# Patient Record
Sex: Female | Born: 1945 | Race: Black or African American | Hispanic: No | Marital: Married | Smoking: Former smoker
Health system: Southern US, Community
[De-identification: ages and names within clinical notes are randomized; demographics above are authoritative.]

## PROBLEM LIST (undated history)

## (undated) DIAGNOSIS — R531 Weakness: Secondary | ICD-10-CM

## (undated) DIAGNOSIS — F419 Anxiety disorder, unspecified: Secondary | ICD-10-CM

## (undated) DIAGNOSIS — E7800 Pure hypercholesterolemia, unspecified: Secondary | ICD-10-CM

## (undated) DIAGNOSIS — T8859XA Other complications of anesthesia, initial encounter: Secondary | ICD-10-CM

## (undated) DIAGNOSIS — R51 Headache: Secondary | ICD-10-CM

## (undated) DIAGNOSIS — Z8719 Personal history of other diseases of the digestive system: Secondary | ICD-10-CM

## (undated) DIAGNOSIS — K224 Dyskinesia of esophagus: Secondary | ICD-10-CM

## (undated) DIAGNOSIS — I499 Cardiac arrhythmia, unspecified: Secondary | ICD-10-CM

## (undated) DIAGNOSIS — I739 Peripheral vascular disease, unspecified: Secondary | ICD-10-CM

## (undated) DIAGNOSIS — R350 Frequency of micturition: Secondary | ICD-10-CM

## (undated) DIAGNOSIS — Z8614 Personal history of Methicillin resistant Staphylococcus aureus infection: Secondary | ICD-10-CM

## (undated) DIAGNOSIS — F32A Depression, unspecified: Secondary | ICD-10-CM

## (undated) DIAGNOSIS — R109 Unspecified abdominal pain: Secondary | ICD-10-CM

## (undated) DIAGNOSIS — M545 Low back pain, unspecified: Secondary | ICD-10-CM

## (undated) DIAGNOSIS — E611 Iron deficiency: Secondary | ICD-10-CM

## (undated) DIAGNOSIS — R490 Dysphonia: Secondary | ICD-10-CM

## (undated) DIAGNOSIS — I10 Essential (primary) hypertension: Secondary | ICD-10-CM

## (undated) DIAGNOSIS — K219 Gastro-esophageal reflux disease without esophagitis: Secondary | ICD-10-CM

## (undated) DIAGNOSIS — G4733 Obstructive sleep apnea (adult) (pediatric): Secondary | ICD-10-CM

## (undated) DIAGNOSIS — J45909 Unspecified asthma, uncomplicated: Secondary | ICD-10-CM

## (undated) DIAGNOSIS — M797 Fibromyalgia: Secondary | ICD-10-CM

## (undated) DIAGNOSIS — R102 Pelvic and perineal pain: Secondary | ICD-10-CM

## (undated) DIAGNOSIS — K56609 Unspecified intestinal obstruction, unspecified as to partial versus complete obstruction: Secondary | ICD-10-CM

## (undated) DIAGNOSIS — E039 Hypothyroidism, unspecified: Secondary | ICD-10-CM

## (undated) DIAGNOSIS — G629 Polyneuropathy, unspecified: Secondary | ICD-10-CM

## (undated) DIAGNOSIS — H409 Unspecified glaucoma: Secondary | ICD-10-CM

## (undated) DIAGNOSIS — T4145XA Adverse effect of unspecified anesthetic, initial encounter: Secondary | ICD-10-CM

## (undated) DIAGNOSIS — R0602 Shortness of breath: Secondary | ICD-10-CM

## (undated) DIAGNOSIS — M1990 Unspecified osteoarthritis, unspecified site: Secondary | ICD-10-CM

## (undated) DIAGNOSIS — M549 Dorsalgia, unspecified: Secondary | ICD-10-CM

## (undated) DIAGNOSIS — J302 Other seasonal allergic rhinitis: Secondary | ICD-10-CM

## (undated) DIAGNOSIS — F329 Major depressive disorder, single episode, unspecified: Secondary | ICD-10-CM

## (undated) DIAGNOSIS — N952 Postmenopausal atrophic vaginitis: Secondary | ICD-10-CM

## (undated) DIAGNOSIS — R519 Headache, unspecified: Secondary | ICD-10-CM

## (undated) DIAGNOSIS — G8929 Other chronic pain: Secondary | ICD-10-CM

## (undated) DIAGNOSIS — Z86718 Personal history of other venous thrombosis and embolism: Secondary | ICD-10-CM

## (undated) DIAGNOSIS — K5900 Constipation, unspecified: Secondary | ICD-10-CM

## (undated) HISTORY — PX: SPINE SURGERY: SHX786

## (undated) HISTORY — PX: COLONOSCOPY: SHX174

## (undated) HISTORY — PX: COLON SURGERY: SHX602

## (undated) HISTORY — PX: BACK SURGERY: SHX140

## (undated) HISTORY — PX: EYE SURGERY: SHX253

## (undated) HISTORY — PX: TONSILLECTOMY: SUR1361

## (undated) HISTORY — DX: Anxiety disorder, unspecified: F41.9

## (undated) HISTORY — PX: OTHER SURGICAL HISTORY: SHX169

## (undated) HISTORY — PX: DILATION AND CURETTAGE OF UTERUS: SHX78

## (undated) HISTORY — DX: Pure hypercholesterolemia, unspecified: E78.00

## (undated) HISTORY — DX: Dyskinesia of esophagus: K22.4

## (undated) HISTORY — PX: EXPLORATORY LAPAROTOMY: SUR591

## (undated) HISTORY — DX: Hypothyroidism, unspecified: E03.9

## (undated) HISTORY — DX: Unspecified abdominal pain: R10.9

## (undated) HISTORY — DX: Postmenopausal atrophic vaginitis: N95.2

---

## 1976-09-24 HISTORY — PX: ABDOMINAL HYSTERECTOMY: SHX81

## 1987-09-25 DIAGNOSIS — Z86718 Personal history of other venous thrombosis and embolism: Secondary | ICD-10-CM

## 1987-09-25 HISTORY — DX: Personal history of other venous thrombosis and embolism: Z86.718

## 2003-11-10 ENCOUNTER — Encounter: Admission: RE | Admit: 2003-11-10 | Discharge: 2003-11-10 | Payer: Self-pay | Admitting: Family Medicine

## 2003-11-24 ENCOUNTER — Encounter: Admission: RE | Admit: 2003-11-24 | Discharge: 2003-11-24 | Payer: Self-pay | Admitting: Family Medicine

## 2004-01-06 ENCOUNTER — Encounter: Admission: RE | Admit: 2004-01-06 | Discharge: 2004-01-06 | Payer: Self-pay | Admitting: Family Medicine

## 2004-02-10 ENCOUNTER — Encounter: Admission: RE | Admit: 2004-02-10 | Discharge: 2004-02-10 | Payer: Self-pay | Admitting: Sports Medicine

## 2004-03-16 ENCOUNTER — Encounter: Admission: RE | Admit: 2004-03-16 | Discharge: 2004-03-16 | Payer: Self-pay | Admitting: Sports Medicine

## 2004-04-18 ENCOUNTER — Encounter: Admission: RE | Admit: 2004-04-18 | Discharge: 2004-04-18 | Payer: Self-pay | Admitting: Family Medicine

## 2004-06-30 ENCOUNTER — Encounter: Admission: RE | Admit: 2004-06-30 | Discharge: 2004-06-30 | Payer: Self-pay | Admitting: Gastroenterology

## 2004-07-25 ENCOUNTER — Ambulatory Visit (HOSPITAL_COMMUNITY): Admission: RE | Admit: 2004-07-25 | Discharge: 2004-07-25 | Payer: Self-pay | Admitting: Gastroenterology

## 2004-12-01 IMAGING — RF DG ESOPHAGUS
10 of 15 series · 14 of 24 positions shown · non-contrast
Comparison: none

CLINICAL DATA: Dysphagia with slow progression of food.  History of esophageal dilatations and gastroesophageal reflux disease. 
 ESOPHAGUS ? BARIUM SWALLOW

[Series 1: run · 2 of 6 slices shown (1 of 10)]
[im 1/6]
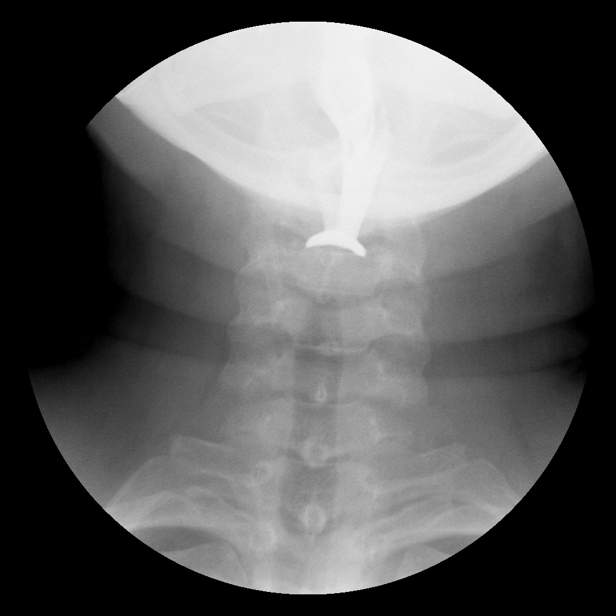
[im 4/6]
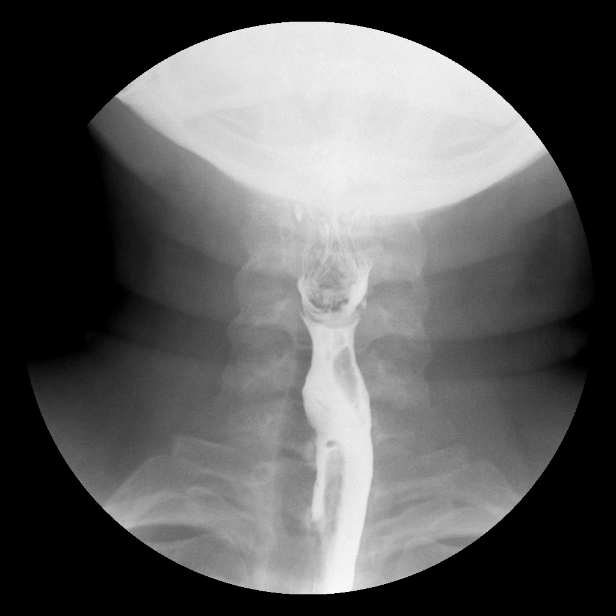

[Series 2: run · 4 of 9 slices shown (2 of 10)]
[im 1/9]
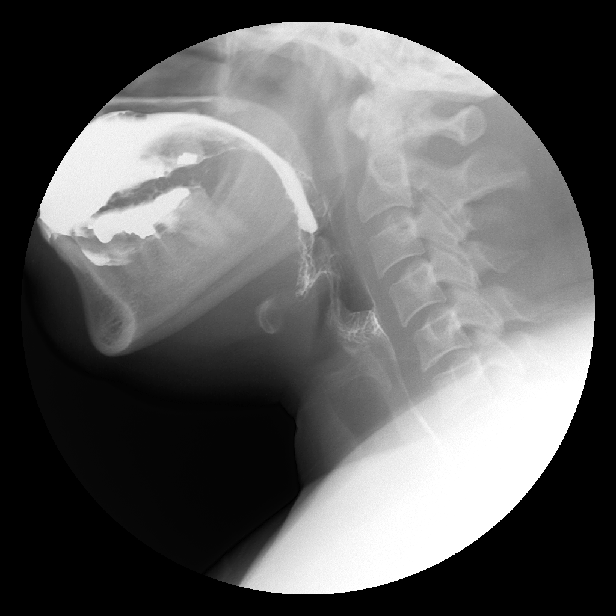
[im 3/9]
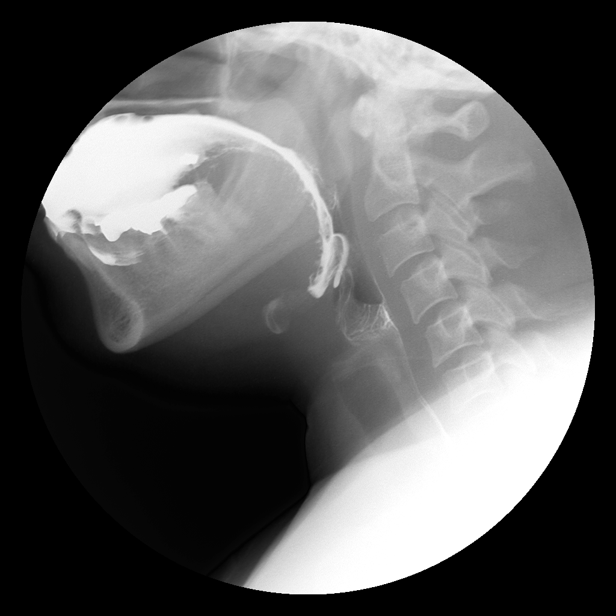
[im 5/9]
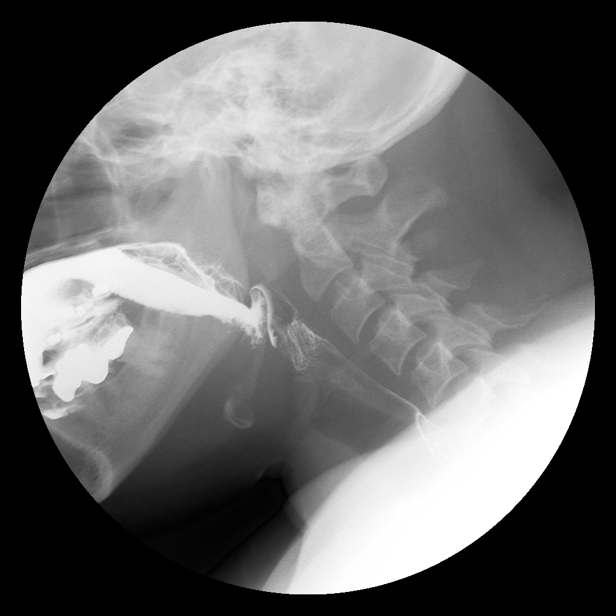
[im 7/9]
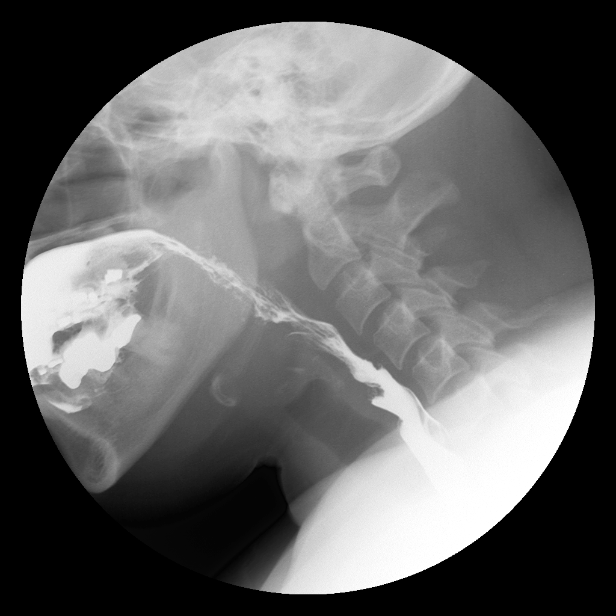

[Series 3: run · 1 of 1 slices shown (3 of 10)]
[im 1/1]
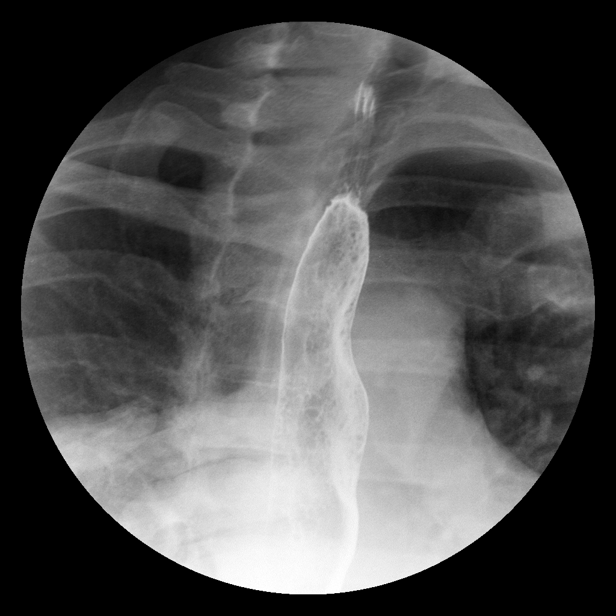

[Series 4: run · 1 of 1 slices shown (4 of 10)]
[im 1/1]
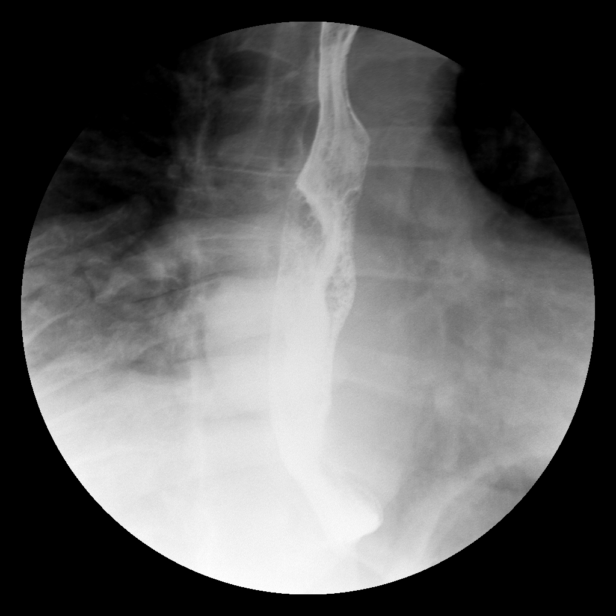

[Series 6: run · 1 of 1 slices shown (5 of 10)]
[im 1/1]
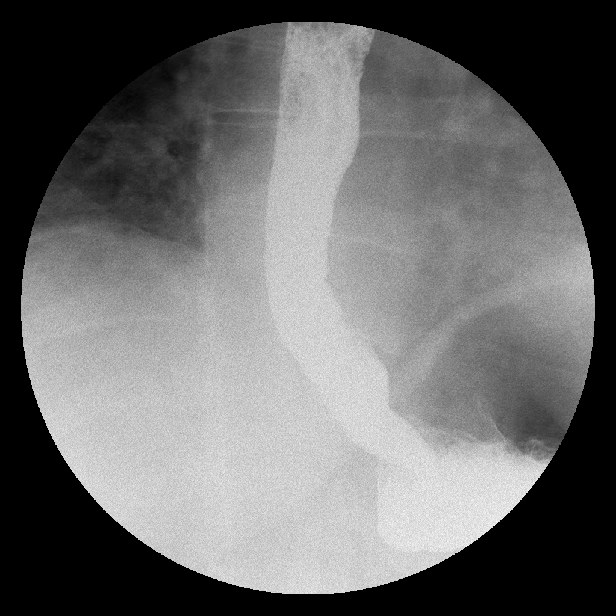

[Series 8: run · 1 of 1 slices shown (6 of 10)]
[im 1/1]
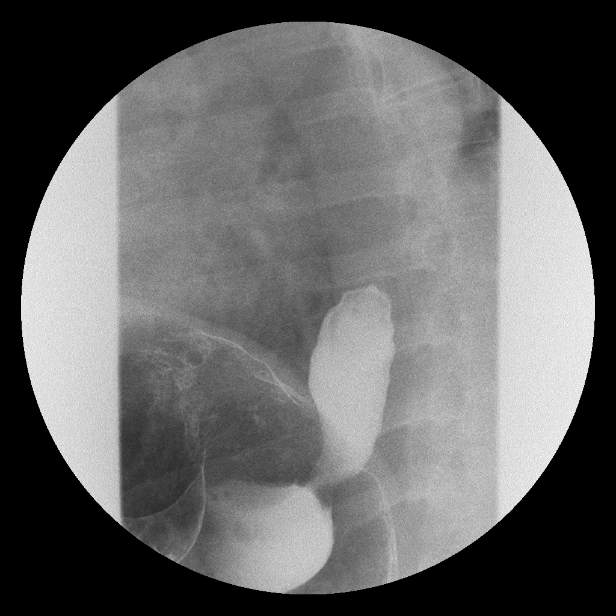

[Series 10: run · 1 of 1 slices shown (7 of 10)]
[im 1/1]
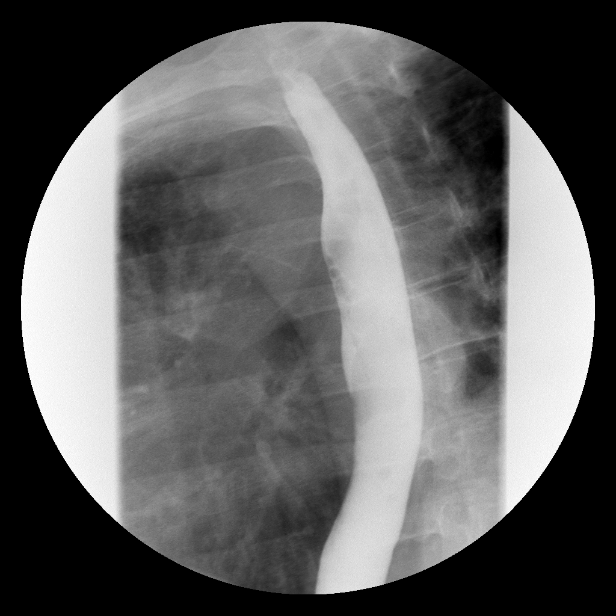

[Series 11: run · 1 of 1 slices shown (8 of 10)]
[im 1/1]
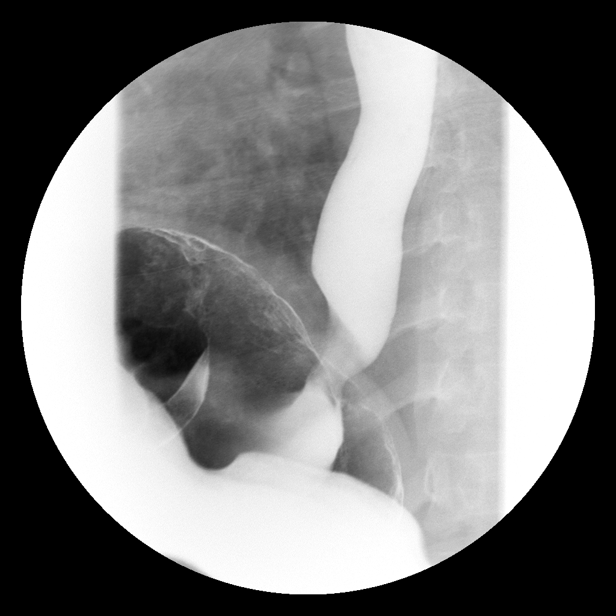

[Series 13: run · 1 of 1 slices shown (9 of 10)]
[im 1/1]
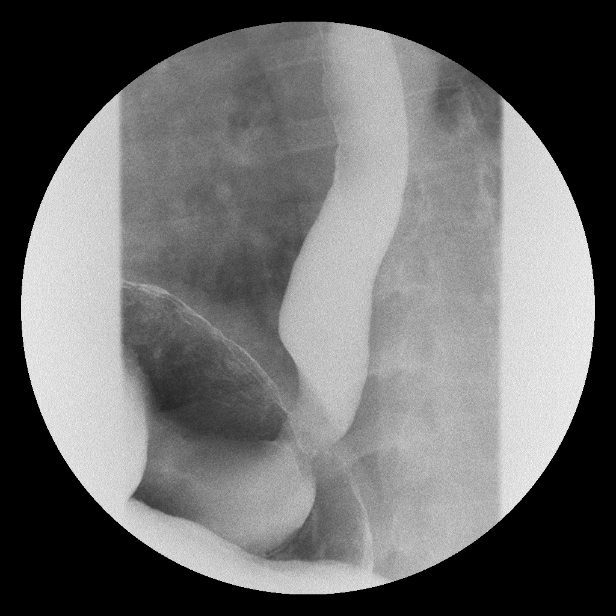

[Series 15: run · 1 of 1 slices shown (10 of 10)]
[im 1/1]
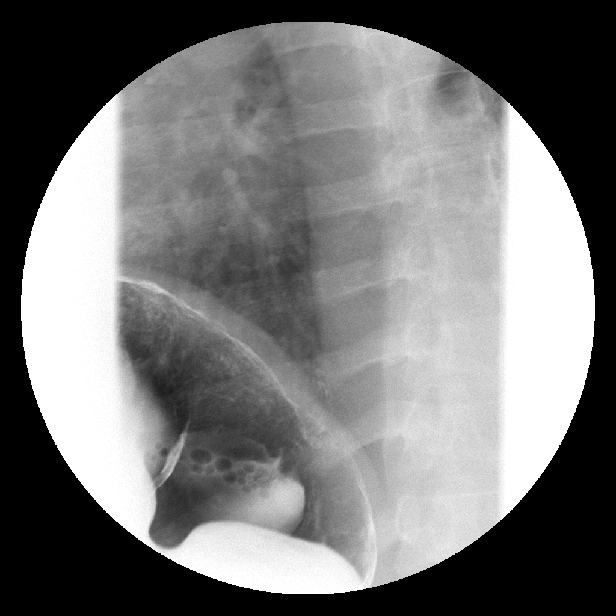

[14 of 24 positions shown; findings below may reference images not displayed]

FINDINGS: The pharyngeal phase of swallowing demonstrates early spillover of contrast with vallecular and piriform pooling.  With subsequent swallows there was some failure of clearing of the vallecula.  There was also flash penetration of the larynx.  Overall, the patient?s swallowing is felt to be dyscoordinated and spleen pathology evaluation is recommended.  However, we did not demonstrate frank tracheal aspiration. 
 Primary peristaltic waves of the esophagus were normal on [DATE] swallows, with disruption on [DATE] swallows.  This is borderline for esophageal dysmotility.  
 A 13 mm barium tablet passed without difficulty into the stomach. 
 Distally, there is some mild narrowing of the esophagus just proximal to the GE junction.  This is a smooth narrowing and I do not see a definite mass to suggest malignancy.  Overall, I estimate the caliber of the distal esophagus at somewhere between 15 and 20 mm.
 IMPRESSION
 1. Dyscoordinated swallowing with flash laryngeal penetration, early spillover, and vallecular and piriform residue.  Recommend dedicated speech pathology evaluation. 
 2.  Borderline appearance for esophageal dysmotility.
 3.  Mild smooth narrowing of the distal esophagus just above the GE junction, with a maximum diameter of 15 to 20 mm.  A 13 mm barium tablet passed without difficulty into the stomach.

## 2005-02-27 ENCOUNTER — Other Ambulatory Visit: Admission: RE | Admit: 2005-02-27 | Discharge: 2005-02-27 | Payer: Self-pay | Admitting: Family Medicine

## 2005-03-01 ENCOUNTER — Encounter: Admission: RE | Admit: 2005-03-01 | Discharge: 2005-03-01 | Payer: Self-pay | Admitting: Family Medicine

## 2005-03-14 ENCOUNTER — Encounter: Admission: RE | Admit: 2005-03-14 | Discharge: 2005-03-14 | Payer: Self-pay | Admitting: Family Medicine

## 2005-06-07 ENCOUNTER — Encounter: Admission: RE | Admit: 2005-06-07 | Discharge: 2005-06-07 | Payer: Self-pay | Admitting: Family Medicine

## 2005-08-02 IMAGING — CT CT PELVIS W/ CM
1 of 2 series · 14 of 32 positions shown, 18 images · IV contrast (gastro & omnipaque [ID])
Comparison: None.

CLINICAL DATA: Abdominal and right lower quadrant pain. 
CT ABDOMEN AND PELVIS W/CONTRAST:
TECHNIQUE: Contiguous 5mm axial images of the abdomen and pelvis were obtained after oral and 985cc of non ionic intravenous contrast.  The patient became quite nauseated and vomited after the IV contrast administration.  I personally examined the patient after this and she had no respiratory wheezing nor any signs/symptoms of oropharyngeal swelling although she did complain of some nasal (stuffiness).  This seemed to be mostly related to the episode of vomiting rather than any substantial nasopharyngeal edema.  She also had no hives or other ill affects.  This is most likely related to a idiosyncratic reaction to the contrast rather than a true contrast allergy.  She was monitored in the [HOSPITAL] for approximately 15 minutes after the exam and was in stable condition at that time with no complaints.  She was discharged in good condition. 
CT ABDOMEN W/CONTRAST: 
No focal abnormalities seen in the liver or spleen.  There is mild wall thickening in the gastric antrum which may be related to peristalsis, but an area of gastric inflammatory wall thickening could also have this appearance.  The duodenum, pancreas, and right adrenal gland have normal features.  A 13mm left adrenal nodule averages 60 hounsfield units which is too high to allow it?s characterization as an adenoma.  Subtle high attenuation in the gallbladder fundus suggests the presence of gallstones.  No focal abnormality is seen in either kidney on the cortical medullary phase of this study.  Renal delay images were not obtained secondary to the patient?s illness.  
No free fluid, lymphadenopathy, or abdominal aortic aneurysm.

[Series 2: — · axial · 0.89mm/px · z∈[-344,+36]mm · 14 of 84 slices shown, 18 images]
[im 4/84  soft-tissue]
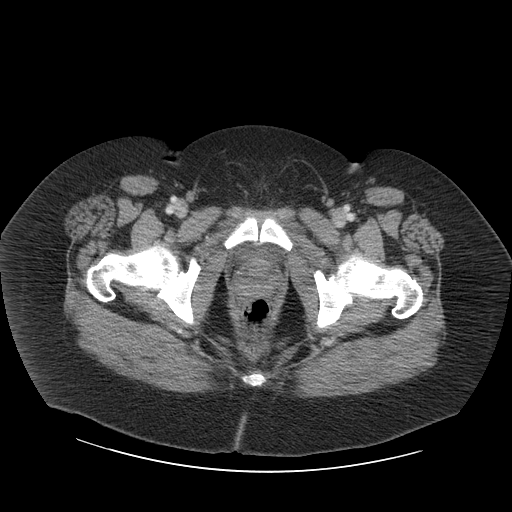
[im 4/84  bone]
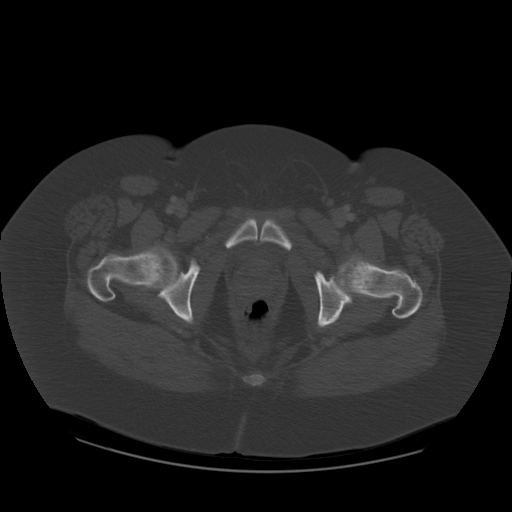
[im 11/84  soft-tissue]
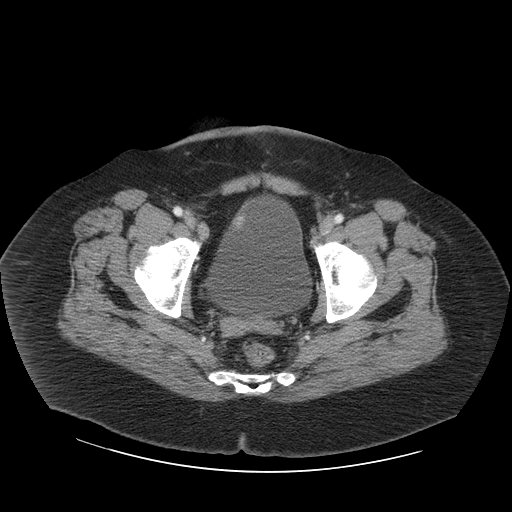
[im 18/84  soft-tissue]
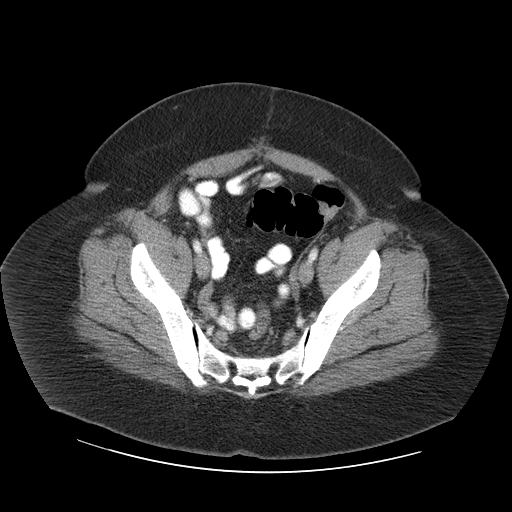
[im 25/84  soft-tissue]
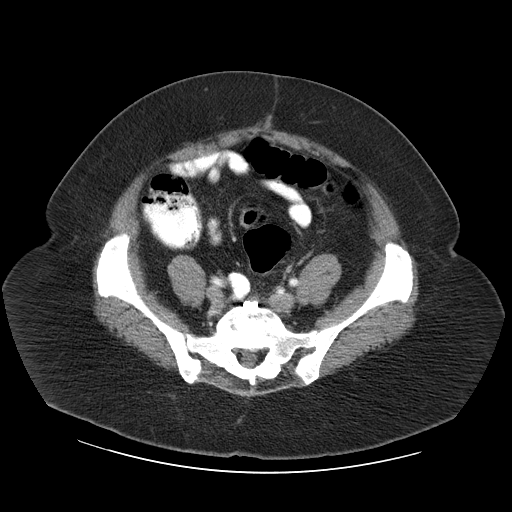
[im 32/84  soft-tissue]
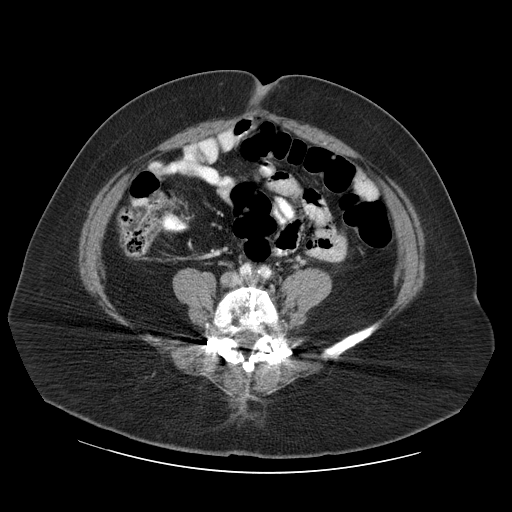
[im 39/84  soft-tissue]
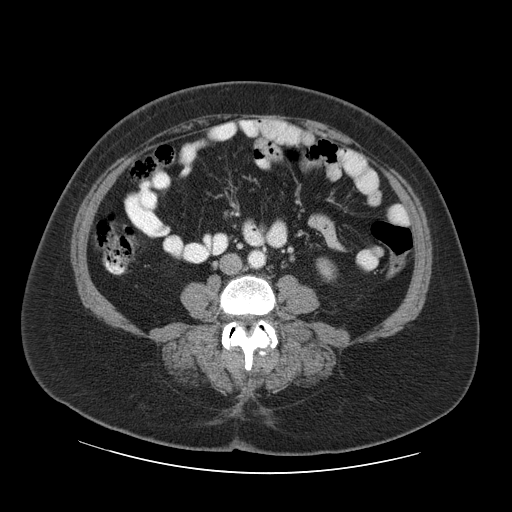
[im 45/84  soft-tissue]
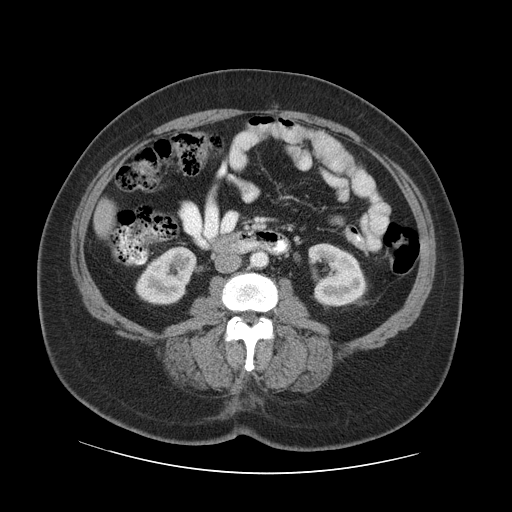
[im 52/84  soft-tissue]
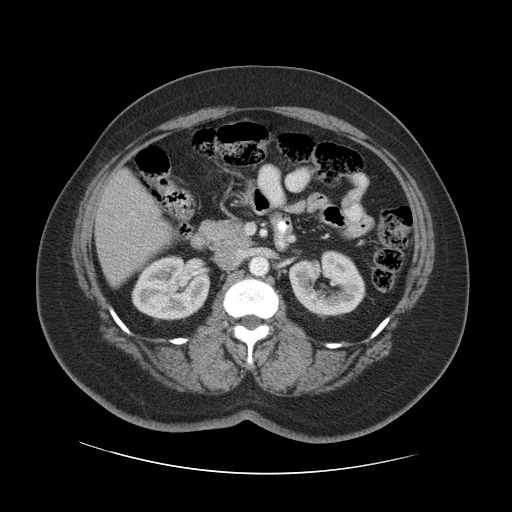
[im 59/84  soft-tissue]
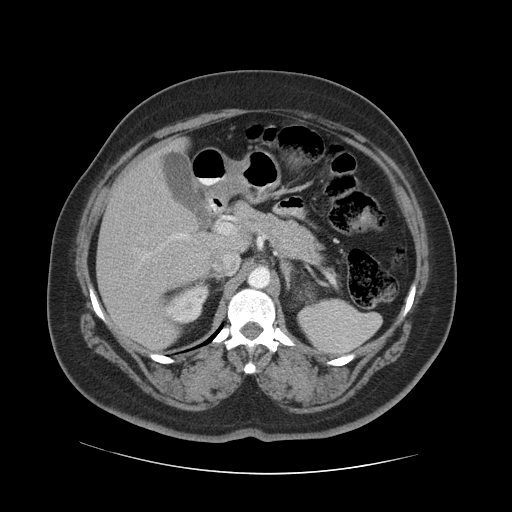
[im 59/84  bone]
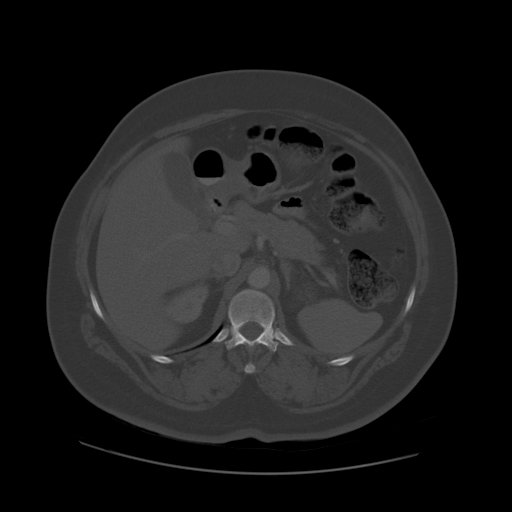
[im 66/84  soft-tissue]
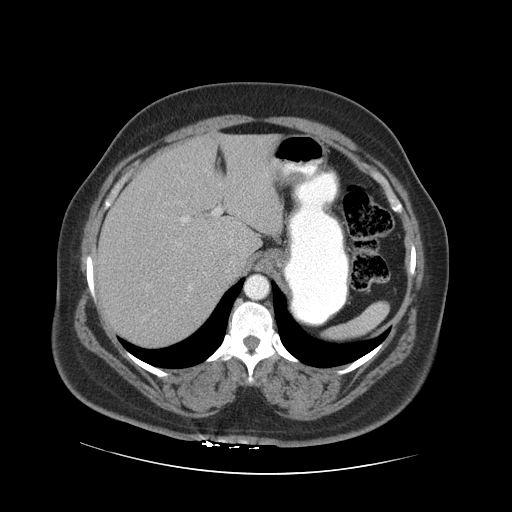
[im 70/84  lung]
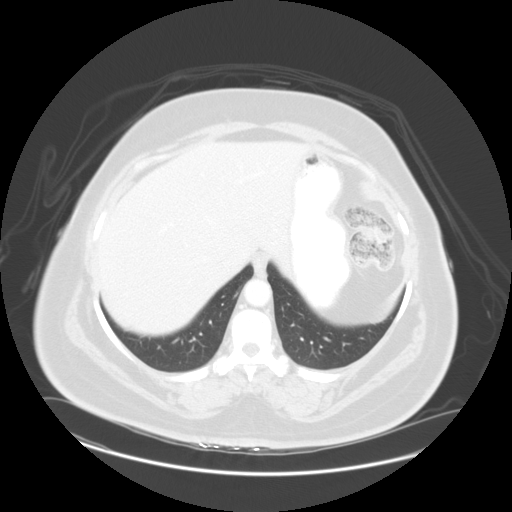
[im 73/84  soft-tissue]
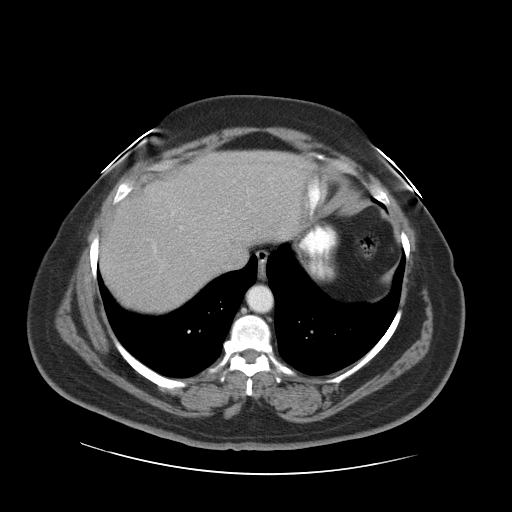
[im 73/84  lung]
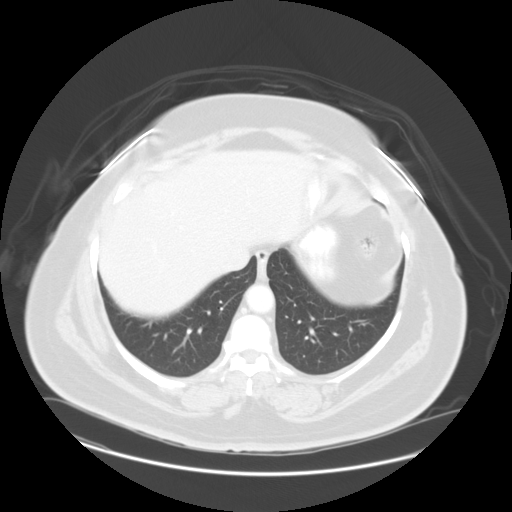
[im 77/84  lung]
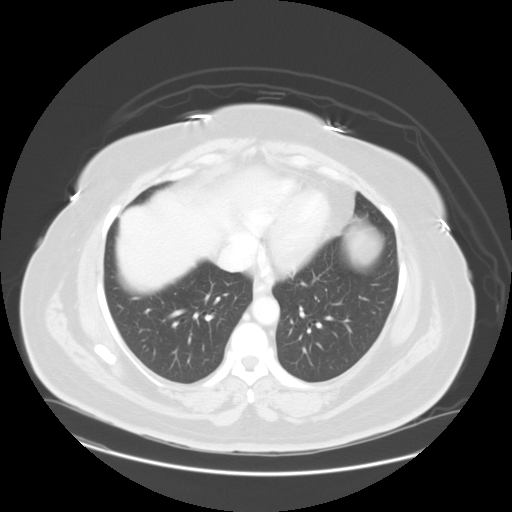
[im 80/84  soft-tissue]
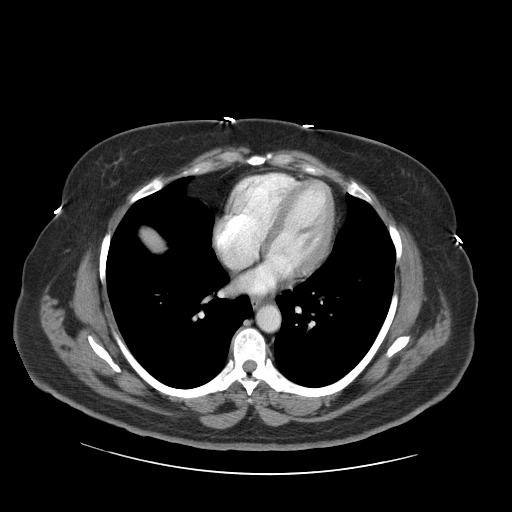
[im 80/84  lung]
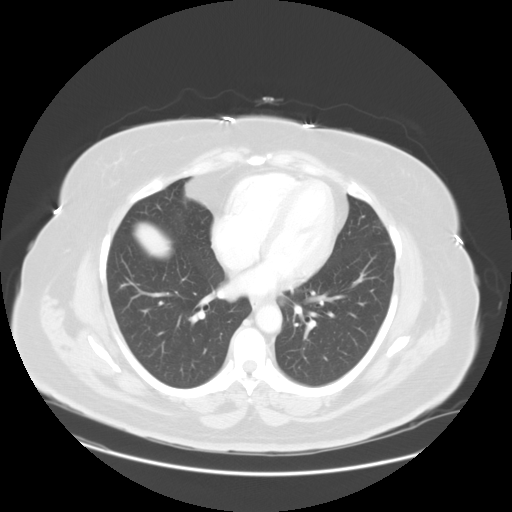

[14 of 32 positions shown; findings below may reference images not displayed]

IMPRESSION: 1.  Question wall thickening in the distal stomach.  Upper GI series or endoscopy would prove helpful to further characterize. 
2.  Probable cholelithiasis.  Ultrasound would be helpful to further investigate if clinically indicated.  
3.  13mm left adrenal nodule is statistically most likely a benign adrenal adenoma but cannot be characterized as such on this post contrast study.  Dedicated adrenal MRI could likely definitively characterize this lesion.  
CT PELVIS W/CONTRAST: 
No free fluid or lymphadenopathy.  Patient is status post hysterectomy.  The ovaries are normal bilaterally.  Terminal ileum is unremarkable.  The appendix is not visualized.  Post operative change is noted in the lower back.
IMPRESSION: No acute findings in the anatomic pelvis.

## 2005-08-15 IMAGING — MR MR ABDOMEN WO/W CM
10 of 12 series · 36 of 48 positions shown · IV contrast (omniscan)
Comparison: none

CLINICAL DATA: Adrenal lesion detected on CT scan, 03/01/05.
MRI ABDOMEN WITHOUT AND WITH CONTRAST:
TECHNIQUE: Multiplanar, multisequence MR imaging of the abdomen was performed both before and after administration of intravenous contrast.
Contrast:  20 cc Omniscan IV.

[Series 4: t2_haste_cor_bh · coronal · 5.0mm · 1.48mm/px · 2 of 13 slices shown (1 of 2)]
[im 1/13]
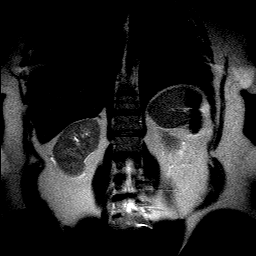
[im 13/13]
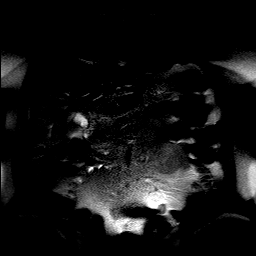

[Series 5: t2_haste_tra_bh · axial · 5.0mm · 1.48mm/px · z∈[-50,+44]mm · 3 of 18 slices shown (1 of 2)]
[im 1/18]
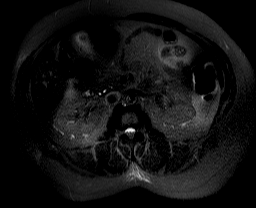
[im 9/18]
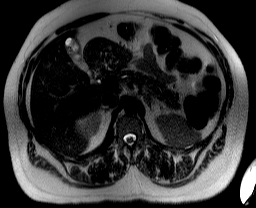
[im 18/18]
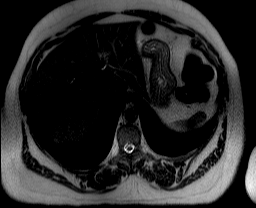

[Series 6: t2_haste_tra_bh · axial · 5.0mm · 1.48mm/px · z∈[-56,+37]mm · 3 of 18 slices shown (2 of 2)]
[im 1/18]
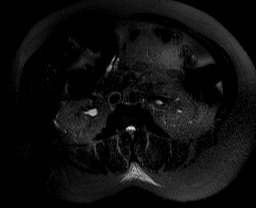
[im 9/18]
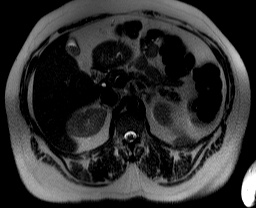
[im 18/18]
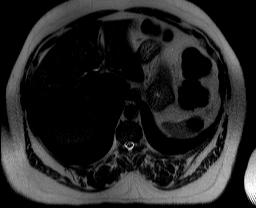

[Series 7: bSSFP · axial · 5.0mm · 1.48mm/px · z∈[-56,+37]mm · 3 of 18 slices shown (1 of 2)]
[im 1/18]
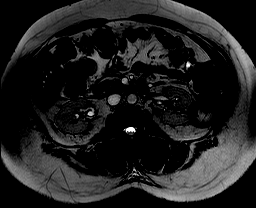
[im 9/18]
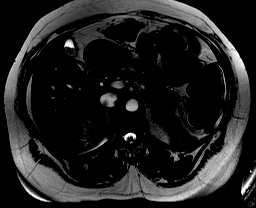
[im 18/18]
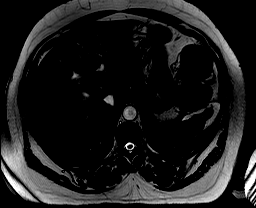

[Series 8: bSSFP · coronal · 5.0mm · 1.48mm/px · 2 of 13 slices shown (2 of 2)]
[im 1/13]
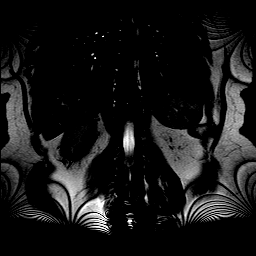
[im 13/13]
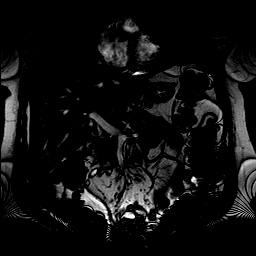

[Series 9: t2_haste_cor_bh · coronal · 5.0mm · 1.48mm/px · 2 of 13 slices shown (2 of 2)]
[im 1/13]
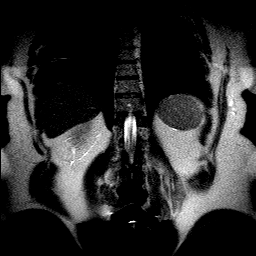
[im 13/13]
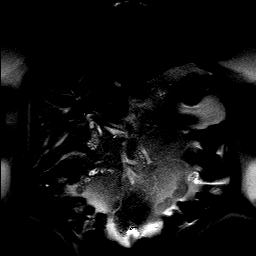

[Series 10: t1_fl2d_in_opp_ph_tra_mbh · axial · 4.0mm · 0.74mm/px · z∈[-75,+28]mm · 7 of 40 slices shown]
[im 1/40]
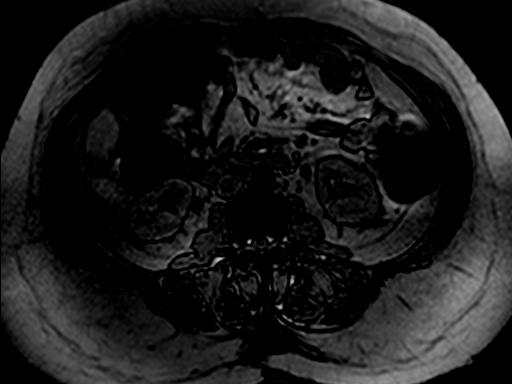
[im 7/40]
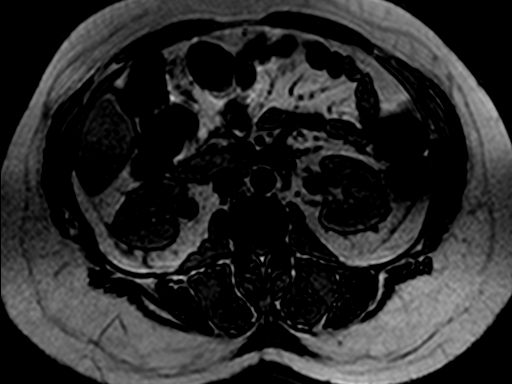
[im 14/40]
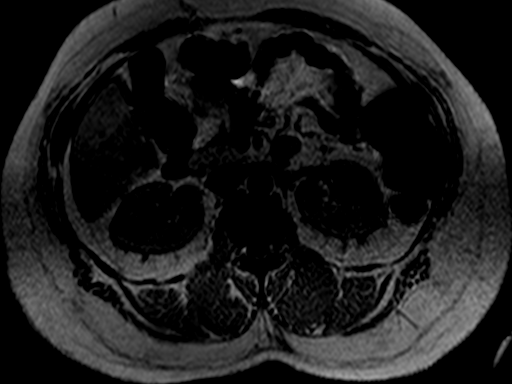
[im 20/40]
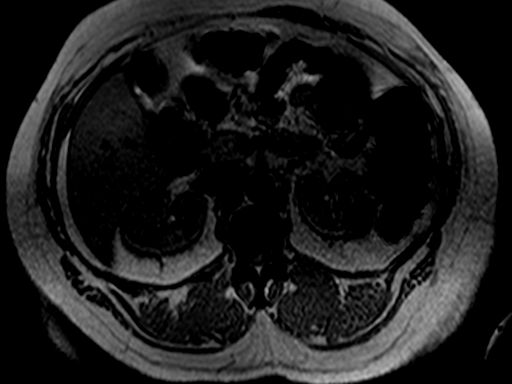
[im 27/40]
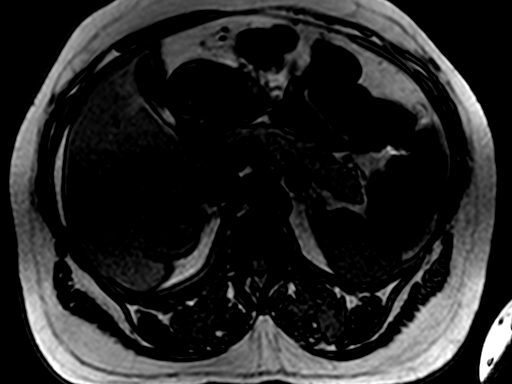
[im 33/40]
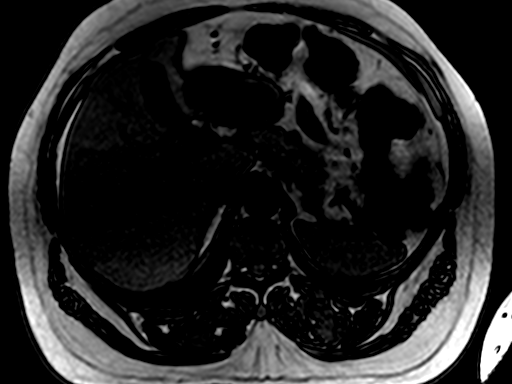
[im 40/40]
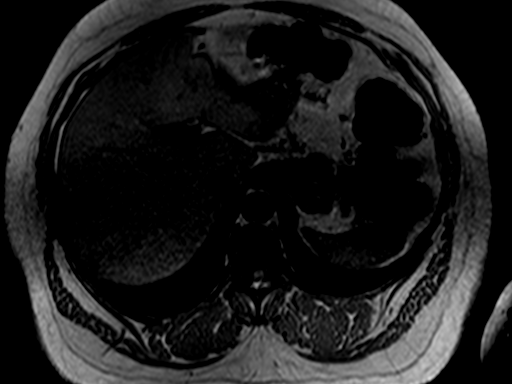

[Series 11: T1 dynamic fat-sat · axial · 2.5mm · 0.74mm/px · z∈[-59,+29]mm · 6 of 36 slices shown]
[im 1/36]
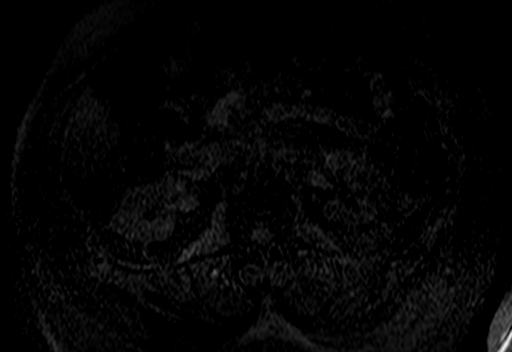
[im 8/36]
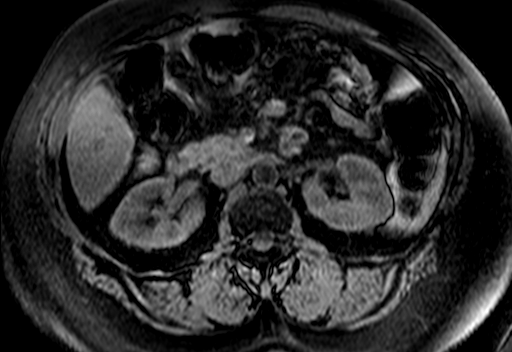
[im 15/36]
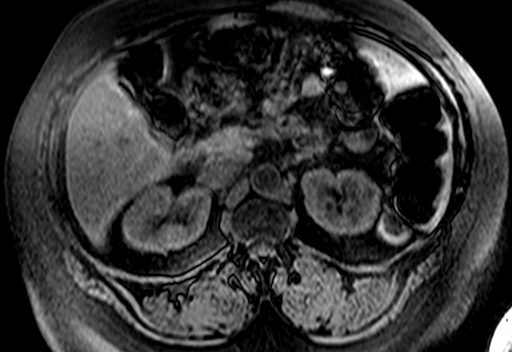
[im 22/36]
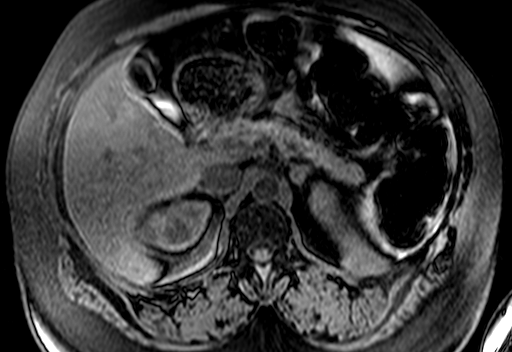
[im 29/36]
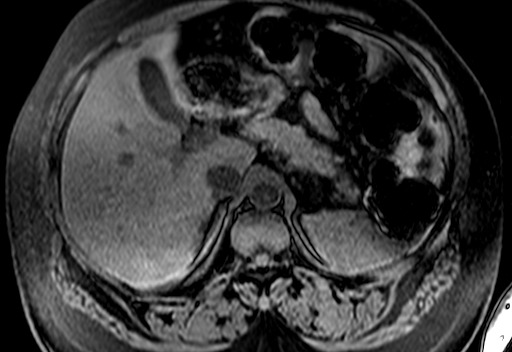
[im 36/36]
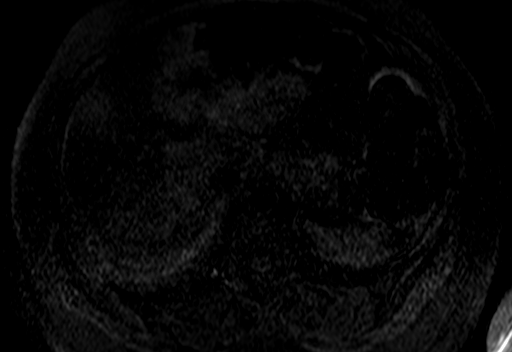

[Series 12: post fs axial-immediate · axial · 2.5mm · 0.74mm/px · z∈[-59,+29]mm · 6 of 36 slices shown]
[im 1/36]
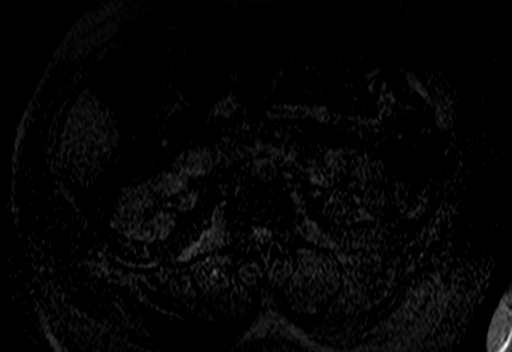
[im 8/36]
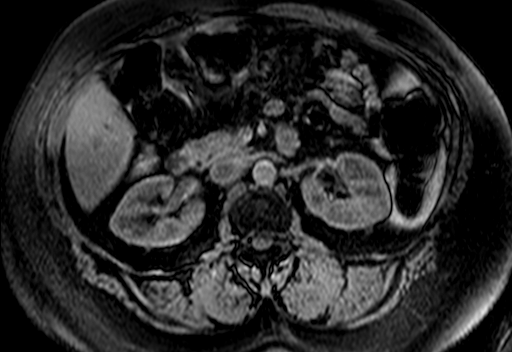
[im 15/36]
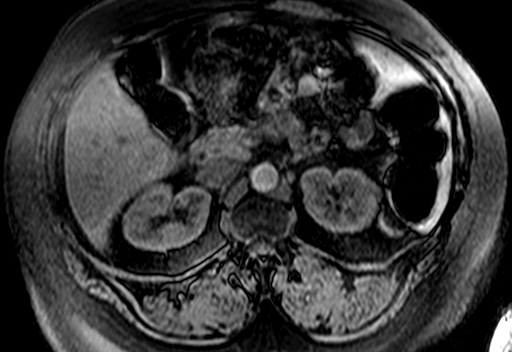
[im 22/36]
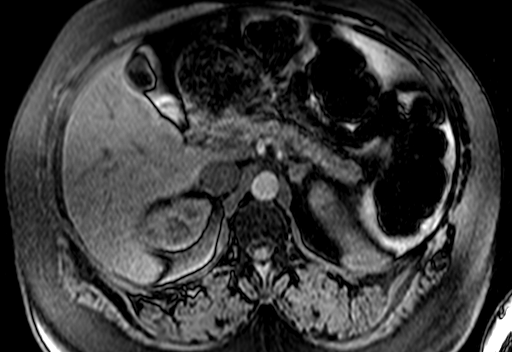
[im 29/36]
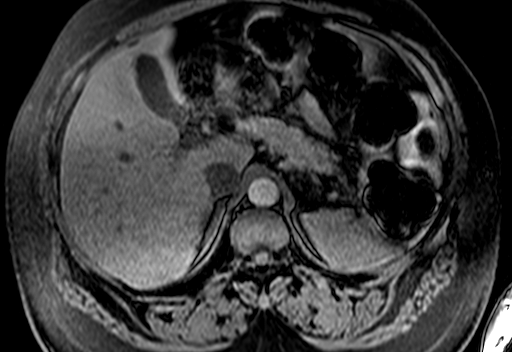
[im 36/36]
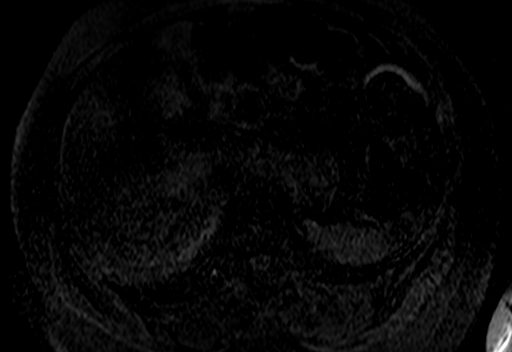

[Series 13: post fs axial · axial · 2.5mm · 0.74mm/px · z∈[-59,-41]mm · 2 of 36 slices shown]
[im 1/36]
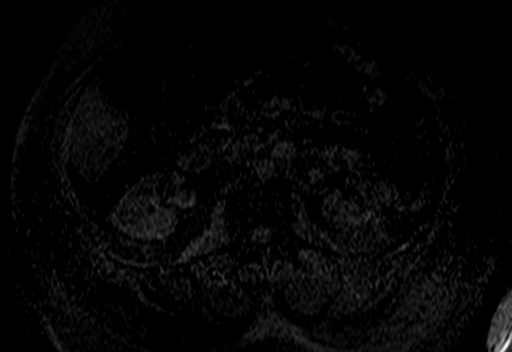
[im 8/36]
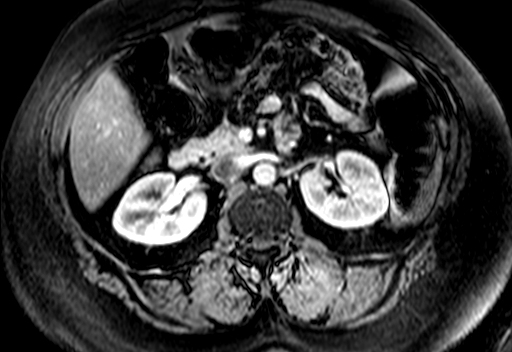

[36 of 48 positions shown; findings below may reference images not displayed]

FINDINGS: As identified on the patient?s CT scan, there is a left adrenal lesion measuring approximately 1.5 x 1.1 cm.  In and opposed phase imaging demonstrates a small focus of signal drop out in the anterolateral aspect of the lesion.  The bulk of the lesion does not demonstrate signal drop out.  This likely represents a lipid poor adenoma, but it cannot be definitively characterized.  Right adrenal gland is normal in appearance.  Kidneys, imaged liver, spleen and pancreas are normal.  Stones are identified within the gallbladder.  No adenopathy or fluid collections.
IMPRESSION: 1.  Left adrenal lesion which almost certainly represents a lipid poor adenoma.  Limited CT scan through the adrenal glands without contrast in three months could be used to ensure stability. 
2.  Gallstones.

## 2005-08-15 IMAGING — MR MR LUMBAR SPINE WO/W CM
4 of 6 series · 19 of 48 positions shown · IV contrast (omniscan)
Comparison: none

CLINICAL DATA: Back pain with right groin pain. 
MRI LUMBAR SPINE WITHOUT AND WITH CONTRAST:
TECHNIQUE: Multiplanar and multiecho pulse sequences of the lumbar spine, to include the lower thoracic region and upper sacral regions, were obtained according to standard protocol before and after administration of intravenous contrast.
Contrast:  20 cc IV Omniscan.
No comparison.

[Series 8: t1_tse_tra_msma · axial · 4.0mm · 0.43mm/px · z∈[-130,+4]mm · 9 of 21 slices shown]
[im 1/21]
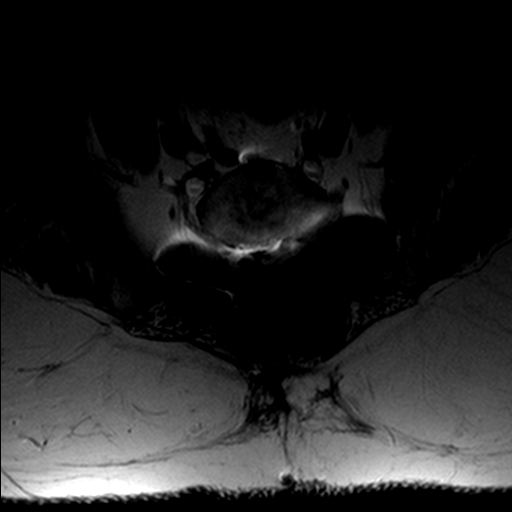
[im 4/21]
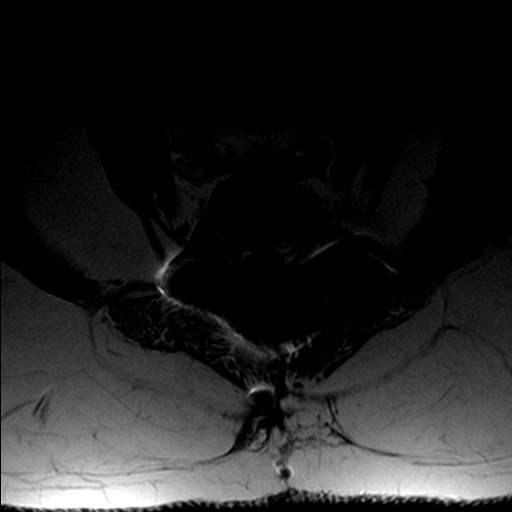
[im 6/21]
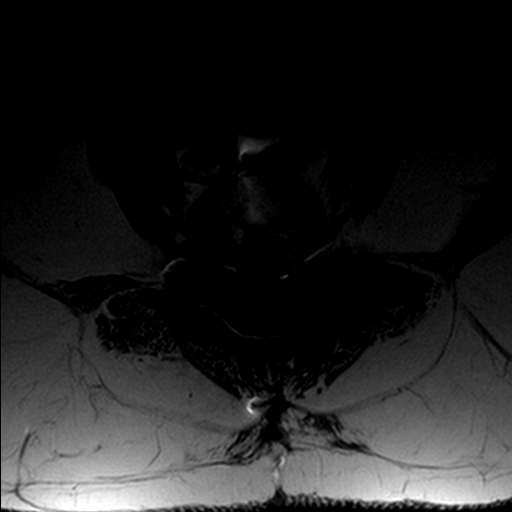
[im 10/21]
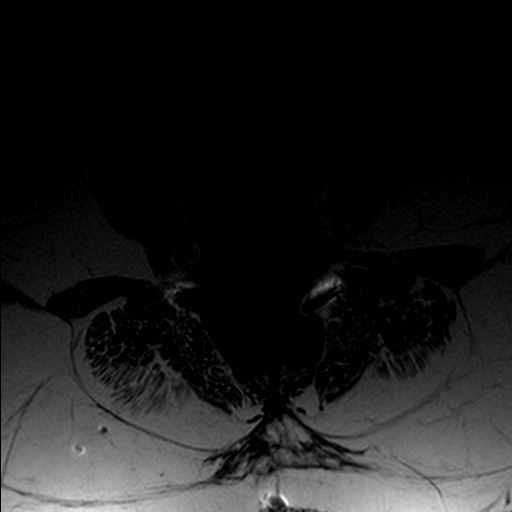
[im 11/21]
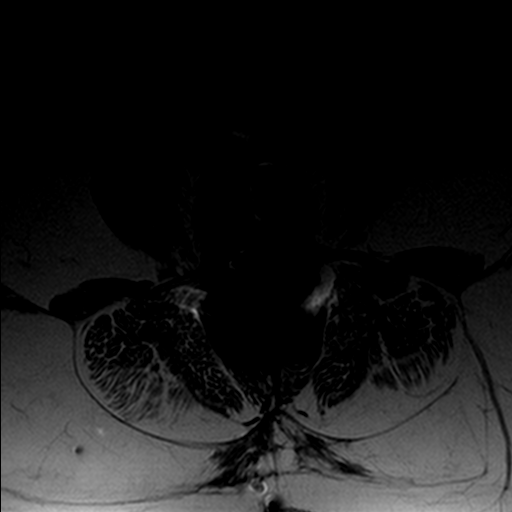
[im 15/21]
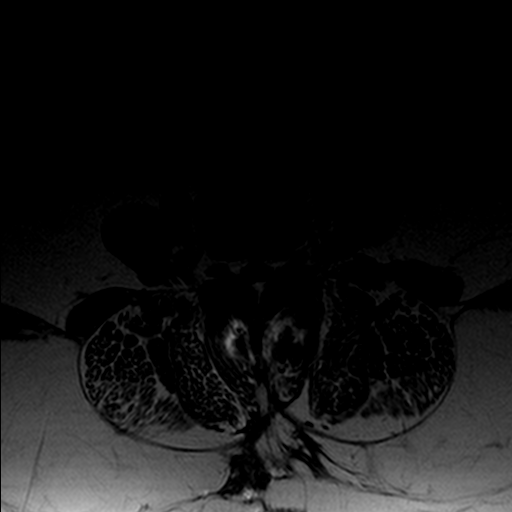
[im 17/21]
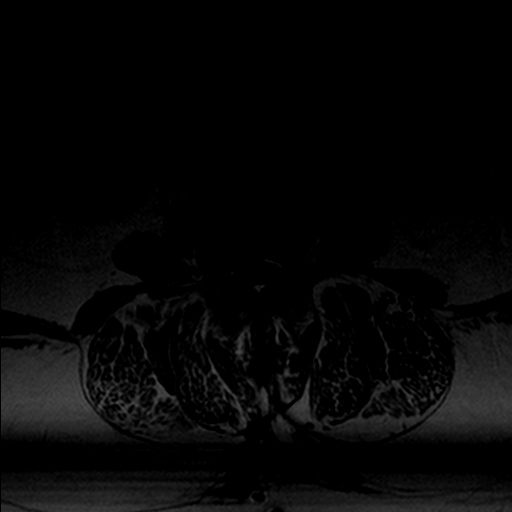
[im 19/21]
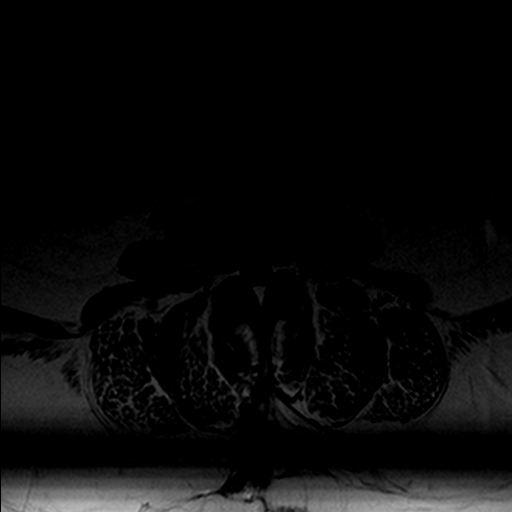
[im 21/21]
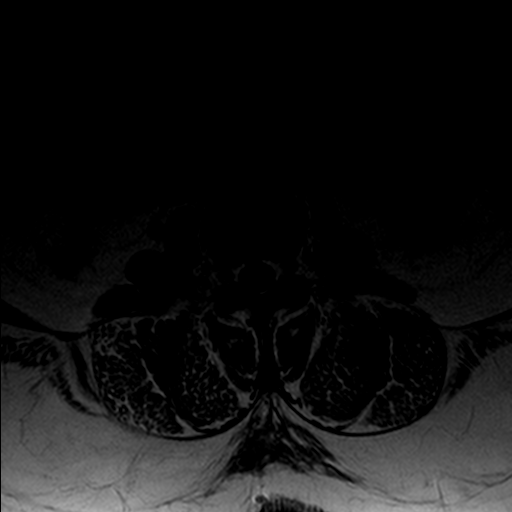

[Series 9: t2_tse_tra_msma · axial · 4.0mm · 0.43mm/px · z∈[-130,-5]mm · 4 of 21 slices shown]
[im 1/21]
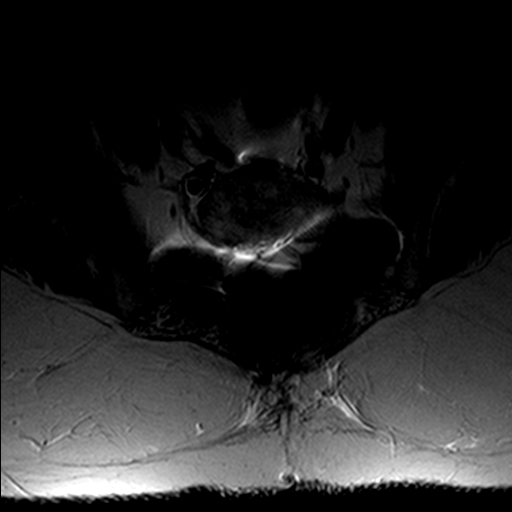
[im 4/21]
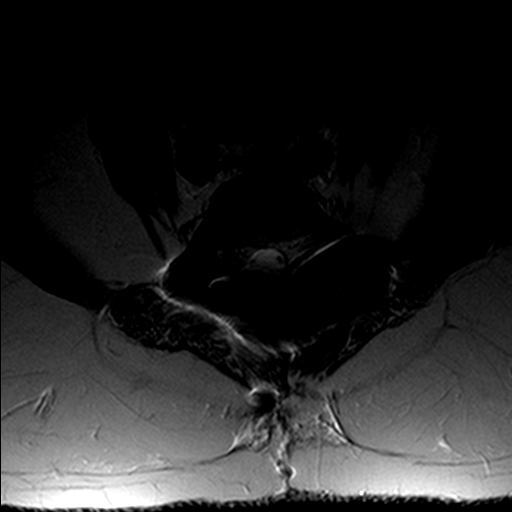
[im 11/21]
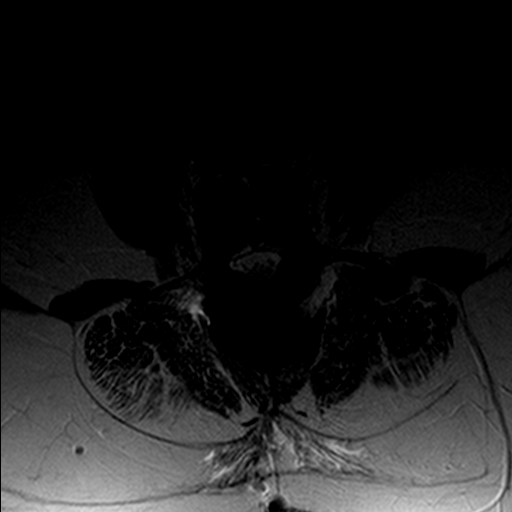
[im 19/21]
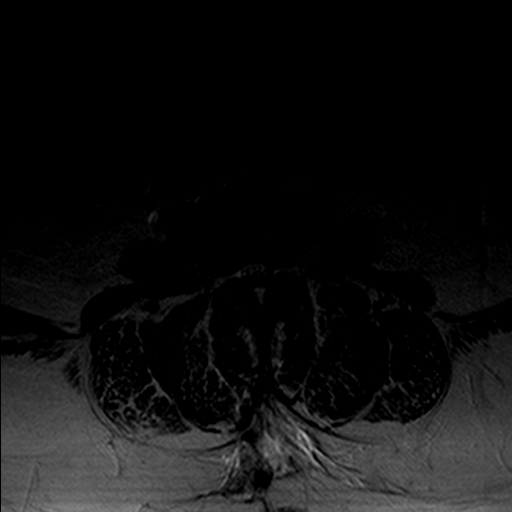

[Series 10: t2_tse_rst_sag_s4_(id) · sagittal · 4.0mm · 0.55mm/px · 3 of 11 slices shown]
[im 3/11]
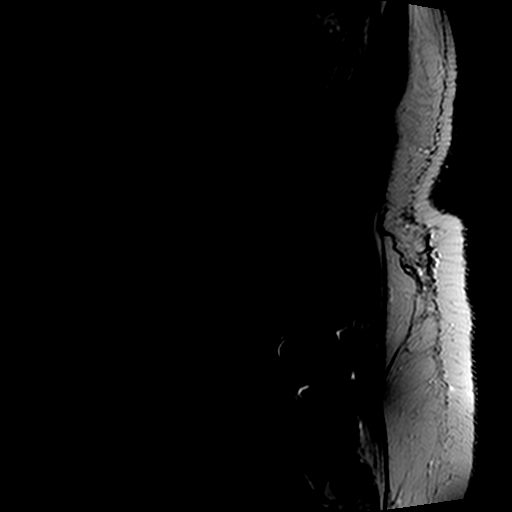
[im 7/11]
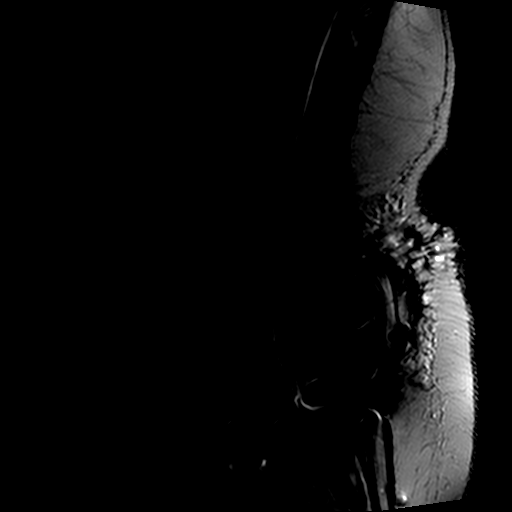
[im 11/11]
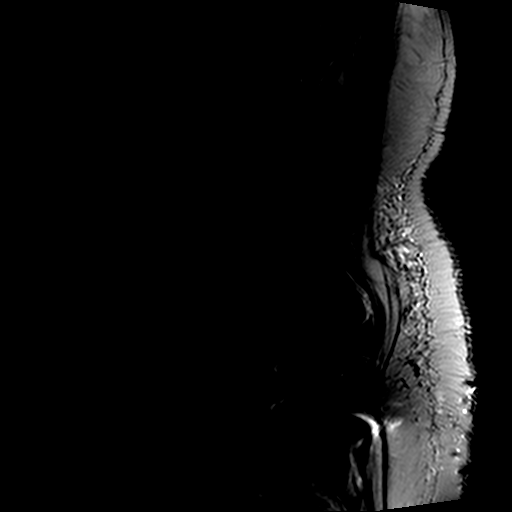

[Series 11: t1_tse_sag_s5_(id) · sagittal · 4.0mm · 0.55mm/px · 3 of 11 slices shown]
[im 3/11]
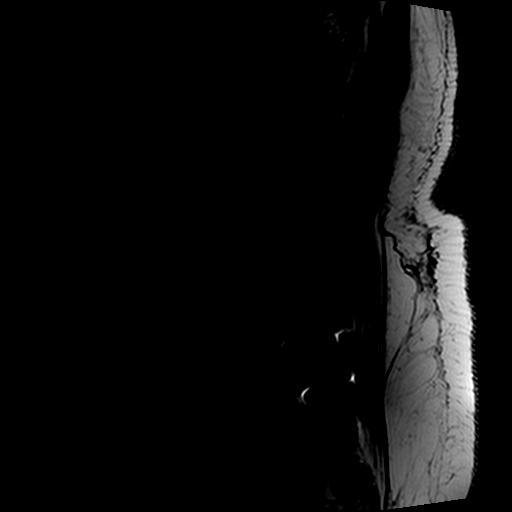
[im 7/11]
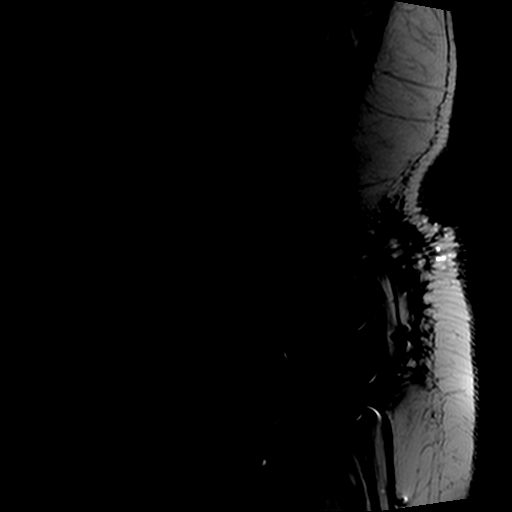
[im 11/11]
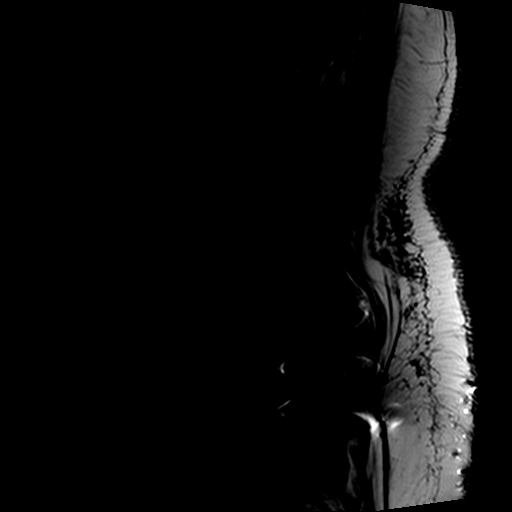

[19 of 48 positions shown; findings below may reference images not displayed]

FINDINGS: There has been prior fusion of L4-5 and L5-S1 with interbody bony fusion.  There also appear to be anterior fusion screws at L4 and L5.  There are posterior fusion screws which appear to be in the posterior elements at L4 and L5.  
I do not have x-rays to correlate the exact location of the fusion hardware.  The lumbar alignment is normal. There is no fracture.  The conus medullaris is normal. There is mild motion on the study.  The enhancement pattern is normal.  
L1-2:  Negative.
L2-3:  Mild disk bulging. 
L3-4:  Broad-based diffuse disk protrusion is present.  In addition, there is moderate facet arthropathy and there is moderate spinal stenosis.
L4-5:  There appears to be a solid interbody fusion.  There is no spinal stenosis although there is considerable artifact due to the hardware.
L5-S1:  This area is obscured by artifact from the hardware.  There is not felt to be spinal stenosis.
IMPRESSION: 1.  Interbody and posterior element fusion of L4, L5 and S1.
2.  Disk protrusion at L3-4. There is facet arthropathy and moderate spinal stenosis at L3-4.

## 2005-09-24 HISTORY — PX: NOSE SURGERY: SHX723

## 2005-10-17 ENCOUNTER — Encounter: Admission: RE | Admit: 2005-10-17 | Discharge: 2006-01-15 | Payer: Self-pay | Admitting: Family Medicine

## 2005-12-10 ENCOUNTER — Encounter: Admission: RE | Admit: 2005-12-10 | Discharge: 2005-12-10 | Payer: Self-pay | Admitting: Family Medicine

## 2005-12-31 ENCOUNTER — Ambulatory Visit: Payer: Self-pay | Admitting: Pulmonary Disease

## 2006-01-11 ENCOUNTER — Encounter (INDEPENDENT_AMBULATORY_CARE_PROVIDER_SITE_OTHER): Payer: Self-pay | Admitting: Specialist

## 2006-01-11 ENCOUNTER — Ambulatory Visit (HOSPITAL_COMMUNITY): Admission: RE | Admit: 2006-01-11 | Discharge: 2006-01-12 | Payer: Self-pay | Admitting: Surgery

## 2006-01-11 HISTORY — PX: LAPAROSCOPIC CHOLECYSTECTOMY: SUR755

## 2006-01-24 ENCOUNTER — Ambulatory Visit (HOSPITAL_BASED_OUTPATIENT_CLINIC_OR_DEPARTMENT_OTHER): Admission: RE | Admit: 2006-01-24 | Discharge: 2006-01-24 | Payer: Self-pay | Admitting: Pulmonary Disease

## 2006-02-08 ENCOUNTER — Ambulatory Visit: Payer: Self-pay | Admitting: Pulmonary Disease

## 2006-02-19 ENCOUNTER — Ambulatory Visit: Payer: Self-pay | Admitting: Pulmonary Disease

## 2006-05-13 IMAGING — US US ABDOMEN COMPLETE
1 series · 14 of 25 positions shown · non-contrast
Comparison: none

CLINICAL DATA: Abdominal pain and nausea.  Particularly right upper quadrant pain.
 ABDOMINAL ULTRASOUND COMPLETE:
TECHNIQUE: Complete abdominal ultrasound examination was performed including evaluation of the liver, gallbladder, bile ducts, pancreas, kidneys, spleen, IVC, and abdominal aorta.

[Series 1: us abdomen complete · 0.26mm/px · 14 of 81 slices shown]
[im 1/81]
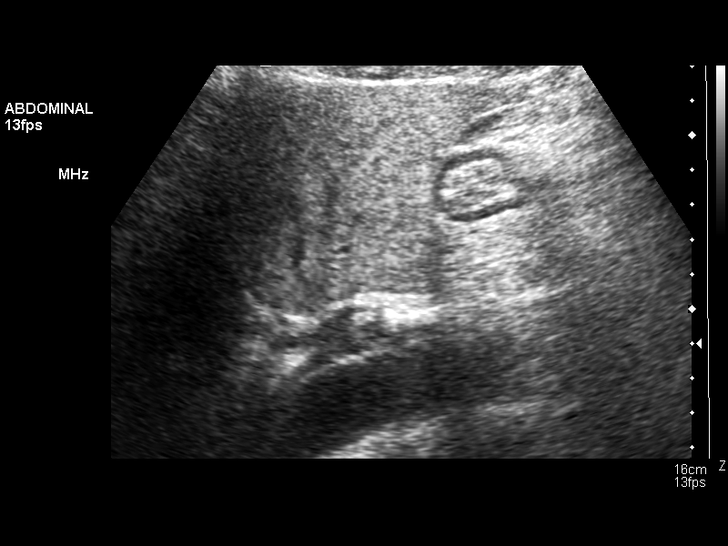
[im 7/81]
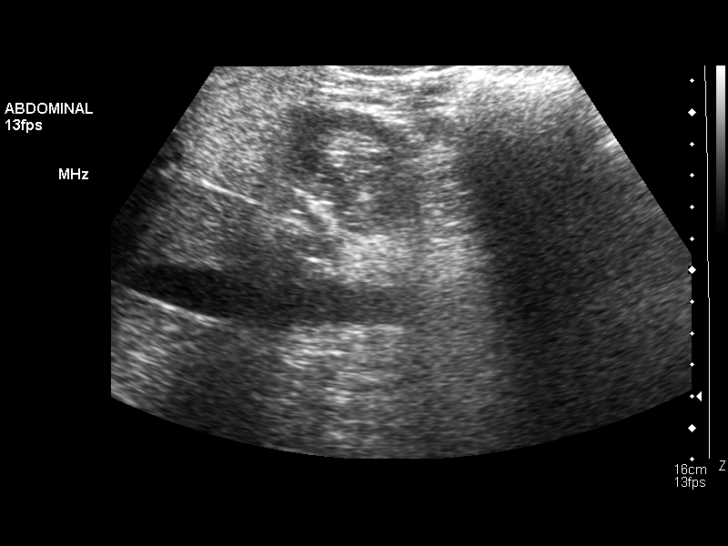
[im 14/81]
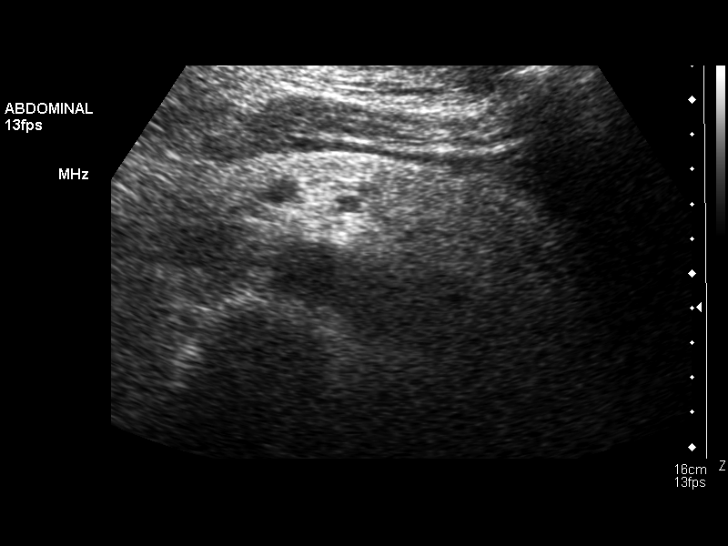
[im 21/81]
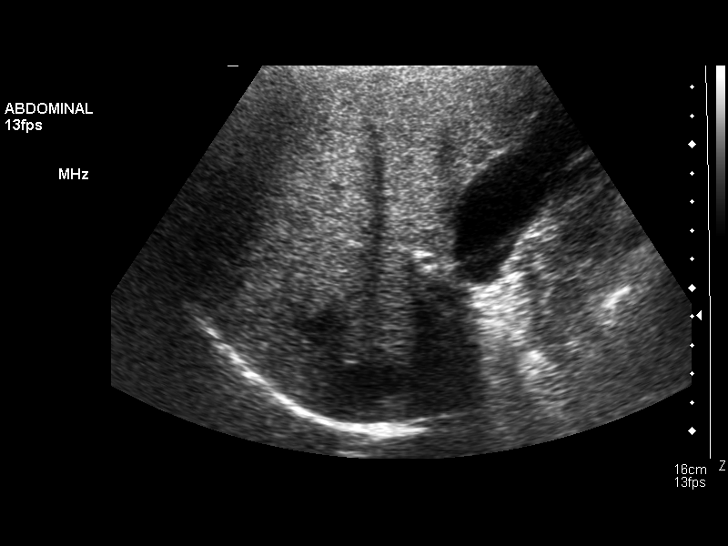
[im 27/81]
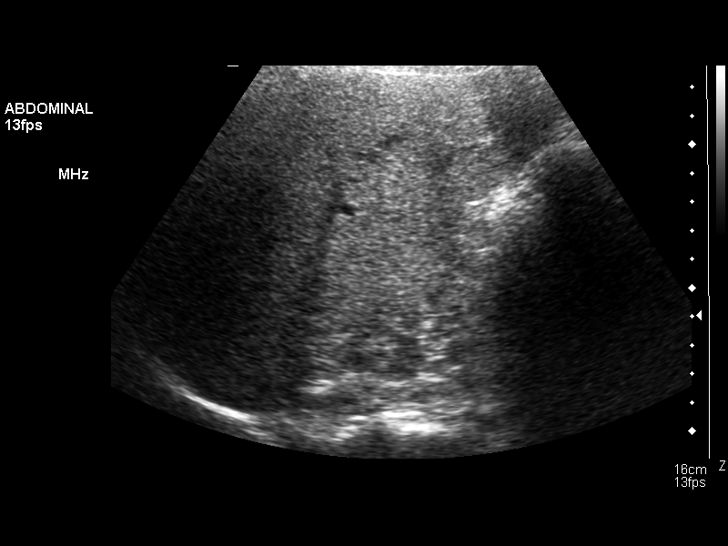
[im 31/81]
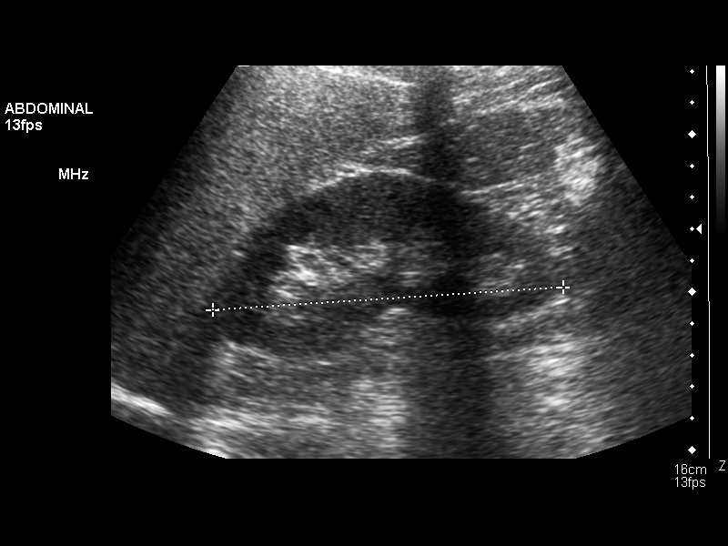
[im 37/81]
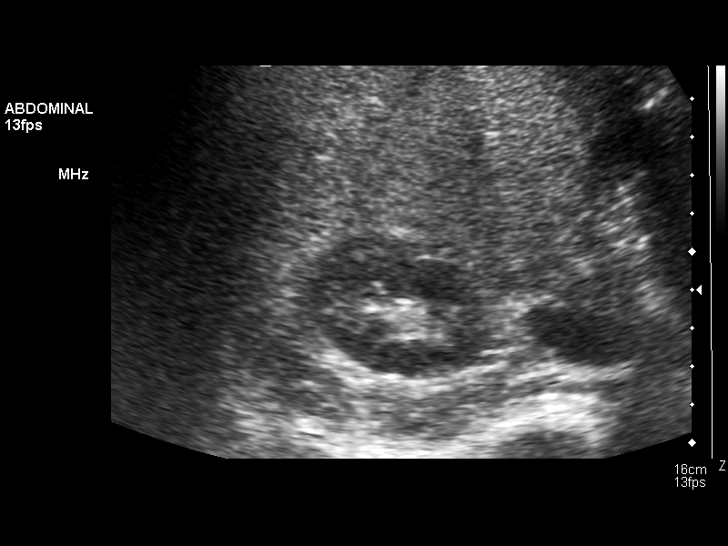
[im 44/81]
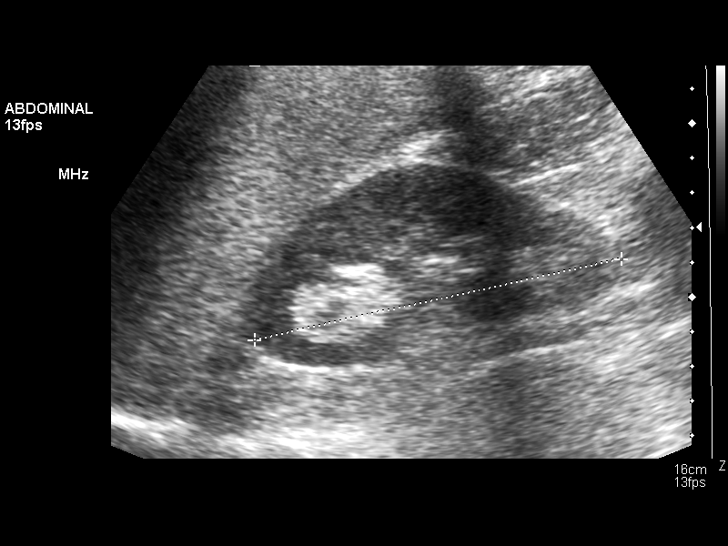
[im 51/81]
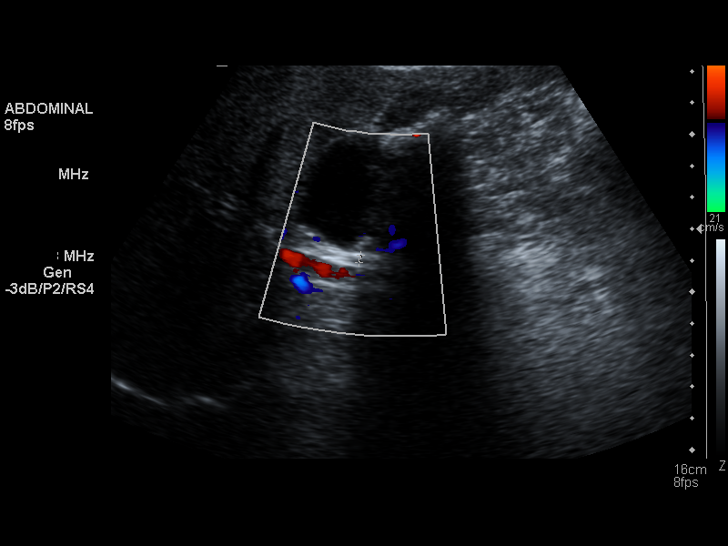
[im 54/81]
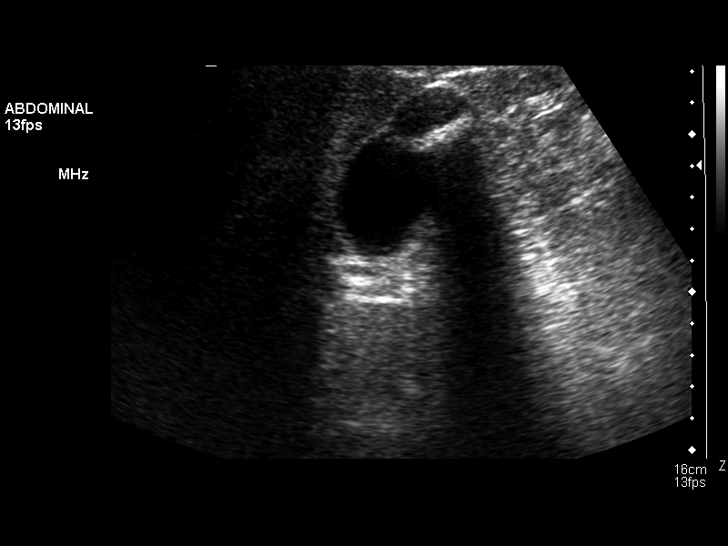
[im 61/81]
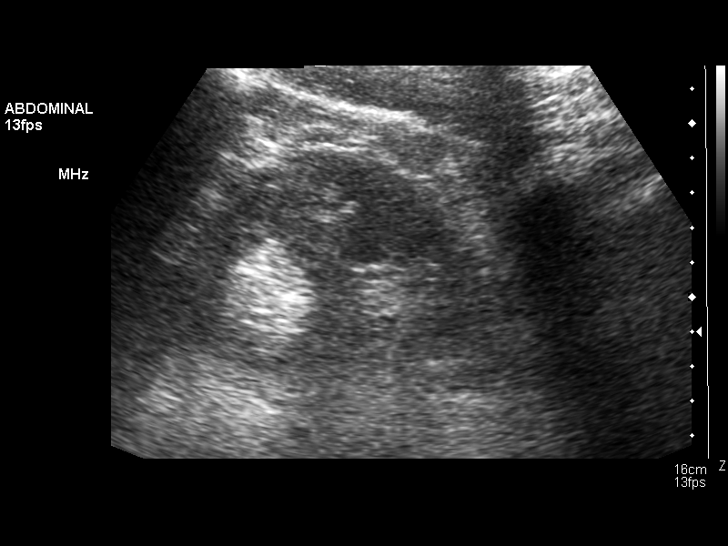
[im 67/81]
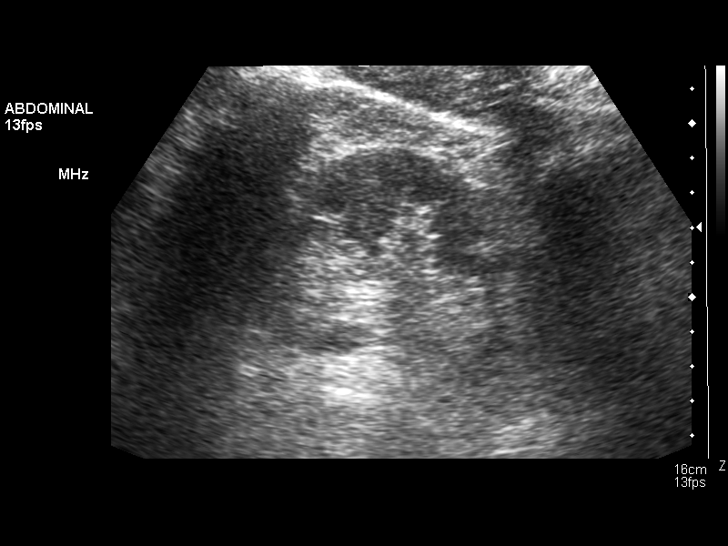
[im 74/81]
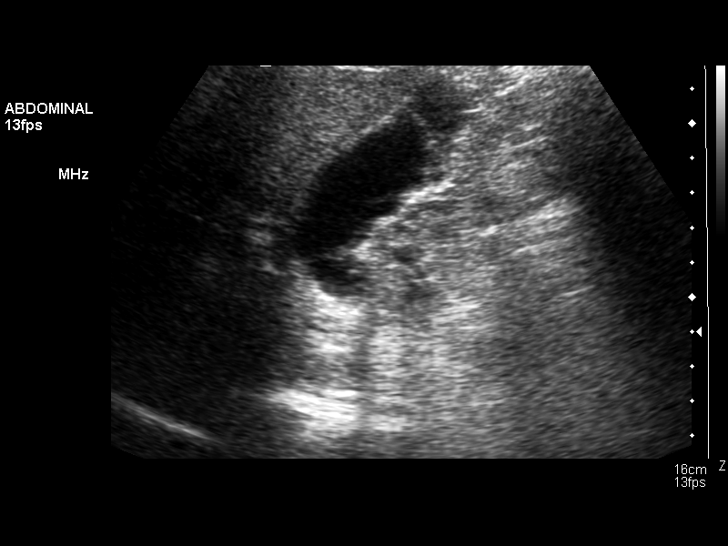
[im 81/81]
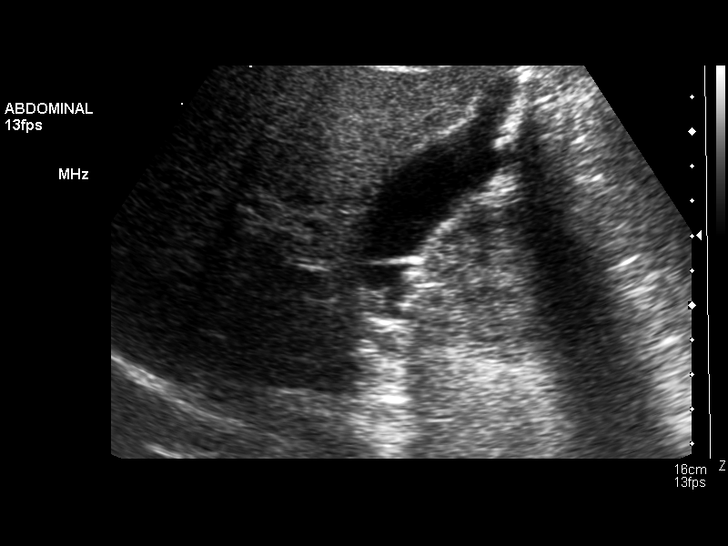

[14 of 25 positions shown; findings below may reference images not displayed]

FINDINGS: The gallbladder is well seen and there are multiple gallstones primarily in the fundus of the gallbladder.  No gallbladder wall thickening is seen.  The liver is echogenic consistent with fatty infiltration.  The common bile duct is normal measuring 2.4 mm in diameter.  No pain is present over the gallbladder upon compression.  The IVC and spleen appear normal with the mid body of the pancreas well seen and normal.  Portions of the tail of the pancreas are obscured by bowel gas.  No hydronephrosis is seen. The right kidney measures 11.1 cm sagittally with the left kidney measuring 9.7 cm.  The abdominal aorta is normal in caliber.  This study is somewhat limited by large patient body habitus and bowel gas.
IMPRESSION: 1.  Multiple gallstones.  No pain over the gallbladder and no ductal dilatation. 
 2.  Fatty infiltration of liver.

## 2006-05-16 ENCOUNTER — Ambulatory Visit: Payer: Self-pay | Admitting: Pulmonary Disease

## 2006-06-11 IMAGING — CR DG CHEST 2V
2 series · 2 of 2 positions shown · non-contrast
Comparison: none

CLINICAL DATA: Gallstones, preoperative chest.
 CHEST - 2 VIEW ? 01/08/06:
 The lungs are clear and the heart and mediastinal structures are normal.

[w chest pa *]
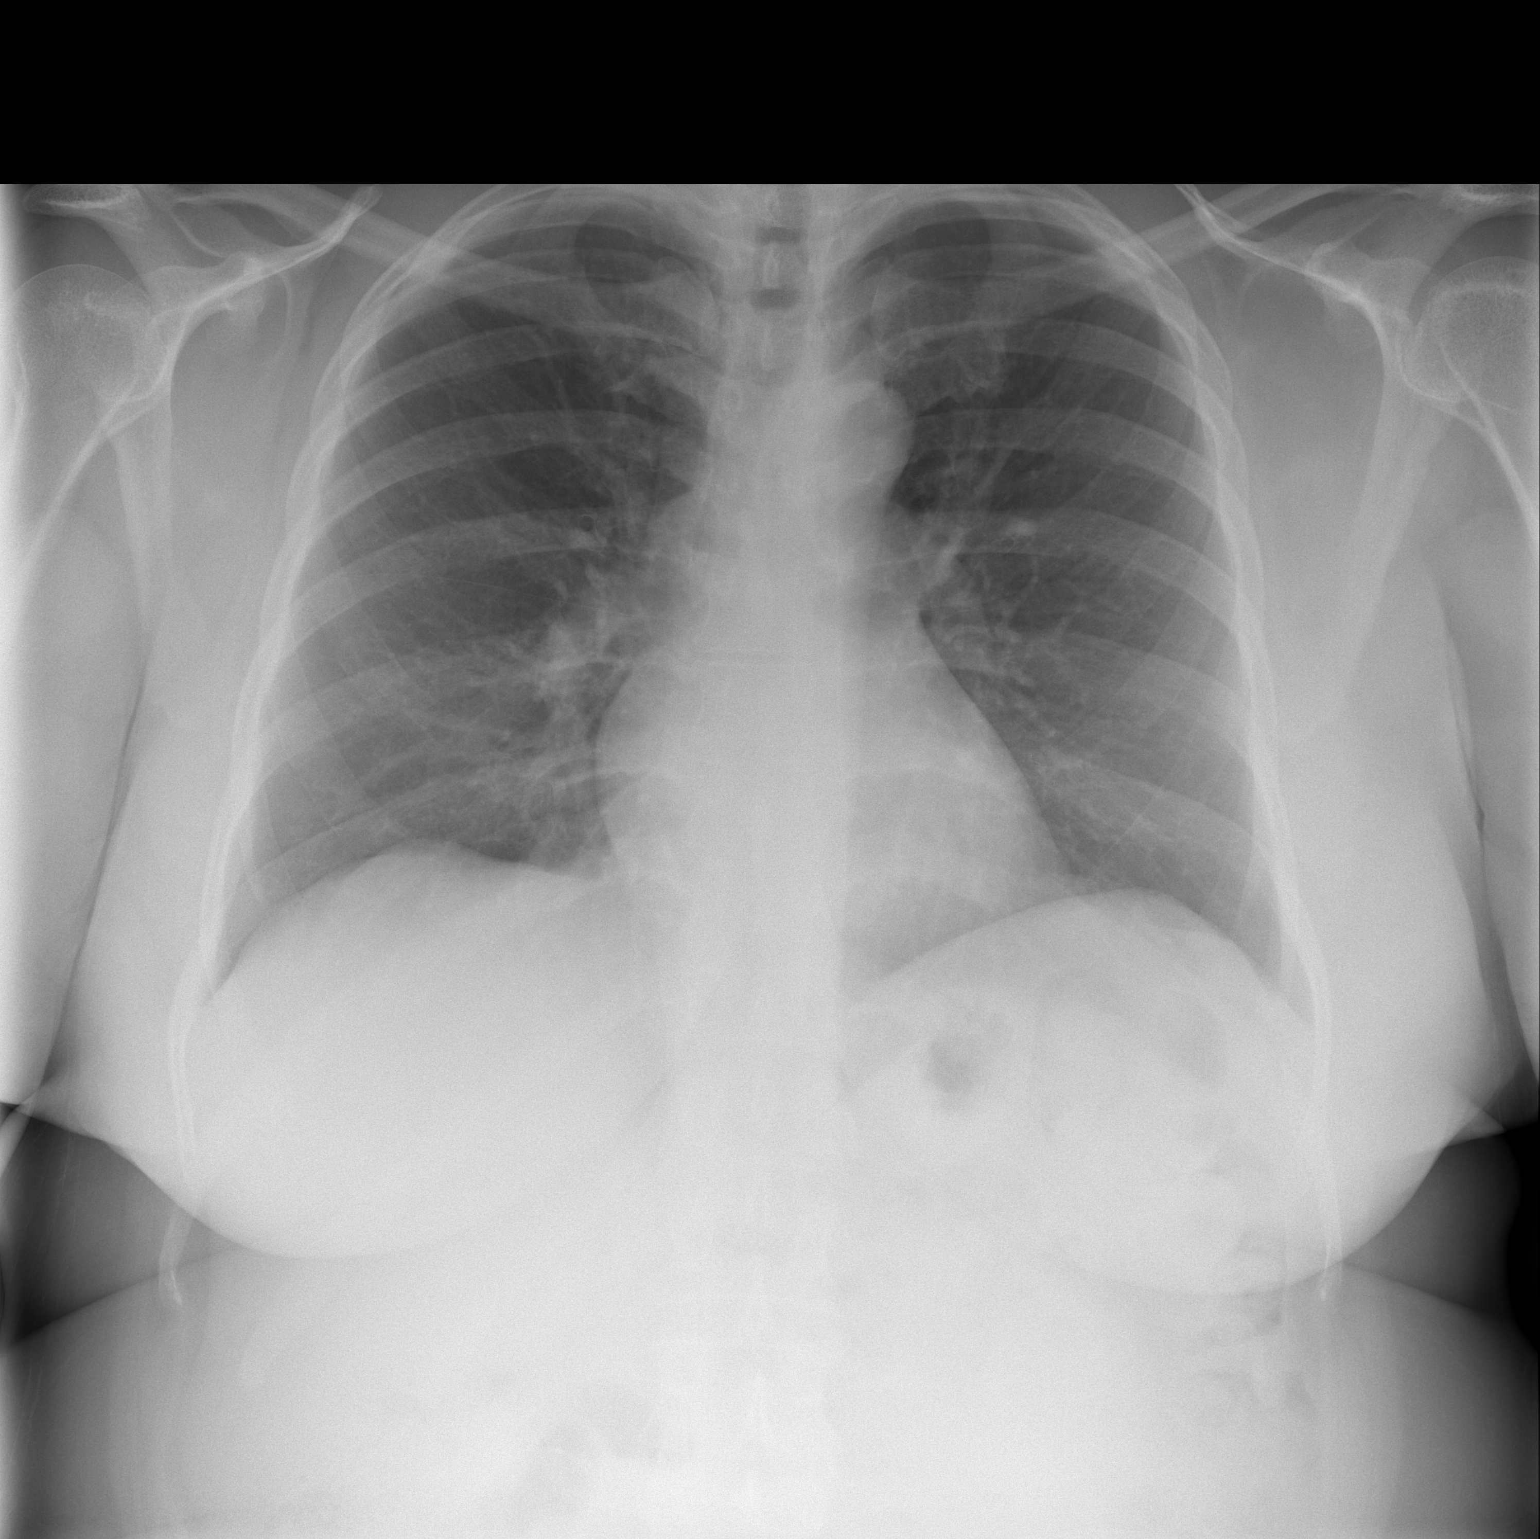

[w chest lat *]
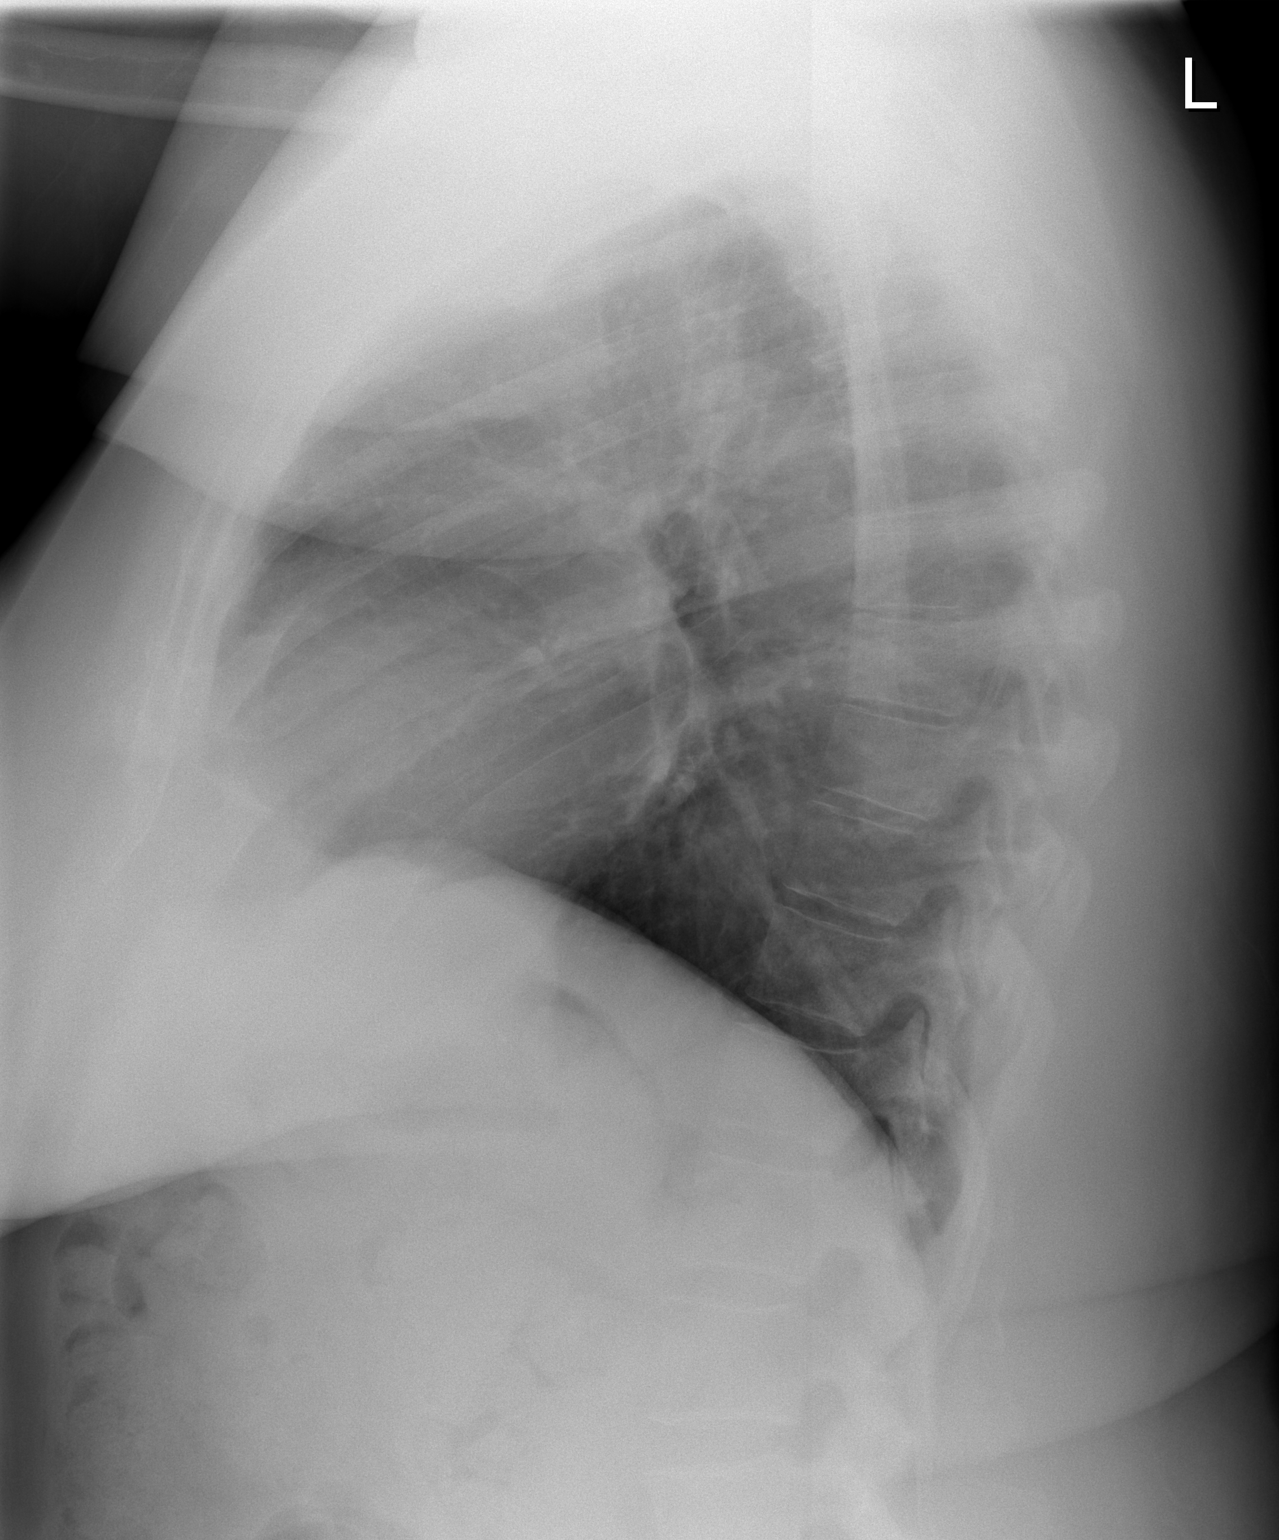

[2 of 2 positions shown; findings below may reference images not displayed]

IMPRESSION: Normal chest.

## 2006-06-14 IMAGING — RF DG CHOLANGIOGRAM OPERATIVE
1 series · 15 of 15 positions shown · non-contrast
Comparison: None.

CLINICAL DATA: Gallstones.  Cholecystectomy.
 INTRAOPERATIVE CHOLANGIOGRAM:
TECHNIQUE: Multiple fluoroscopic spot radiographs were obtained during intraoperative cholangiogram, and are submitted for interpretation post-operatively.

[Series 1: run · 15 of 15 slices shown]
[im 1/15]
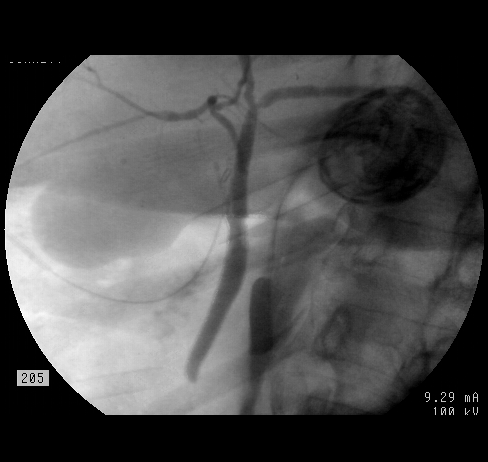
[im 2/15]
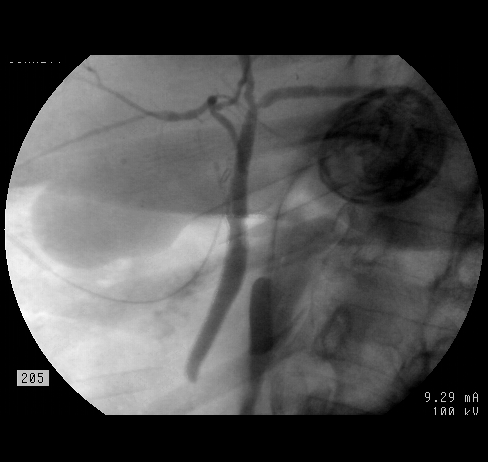
[im 3/15]
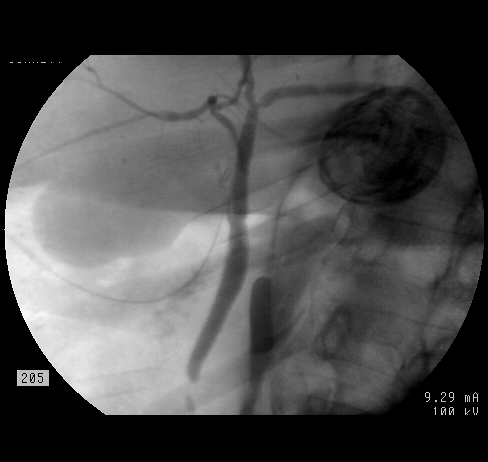
[im 4/15]
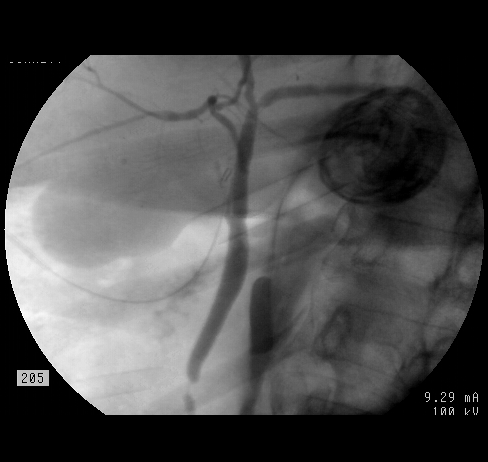
[im 5/15]
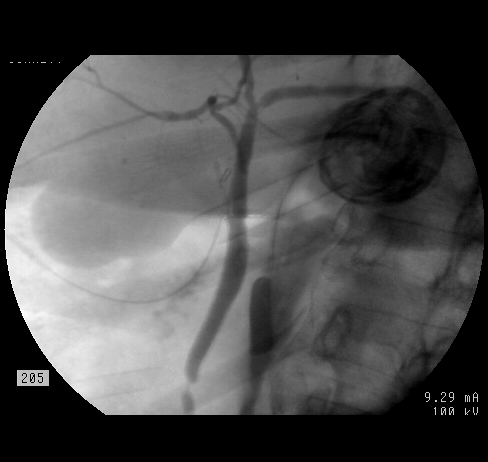
[im 6/15]
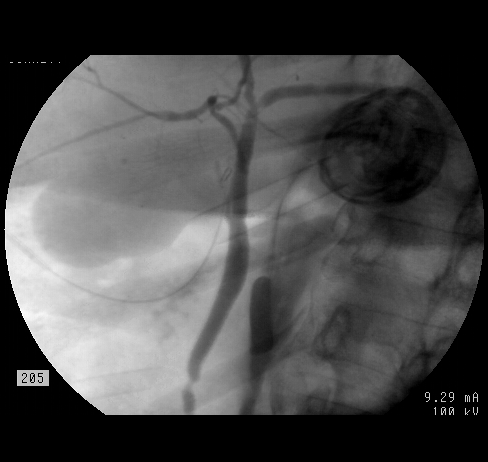
[im 7/15]
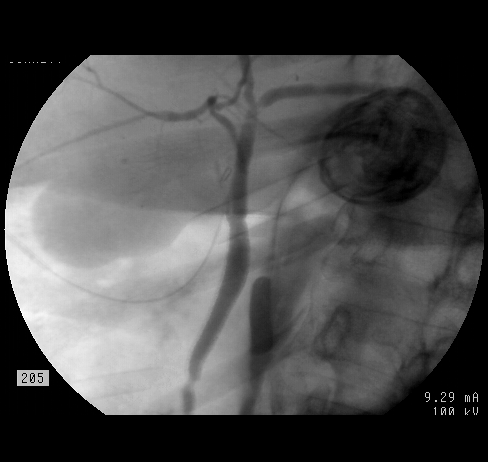
[im 8/15]
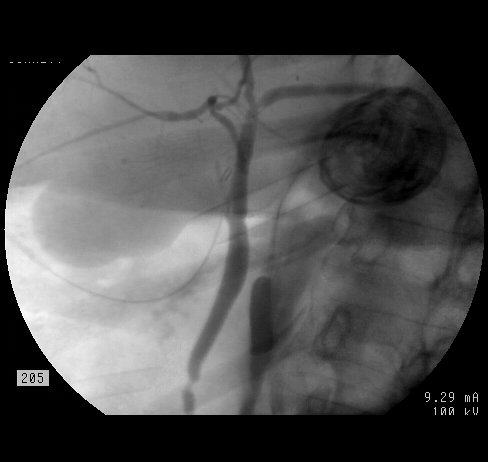
[im 9/15]
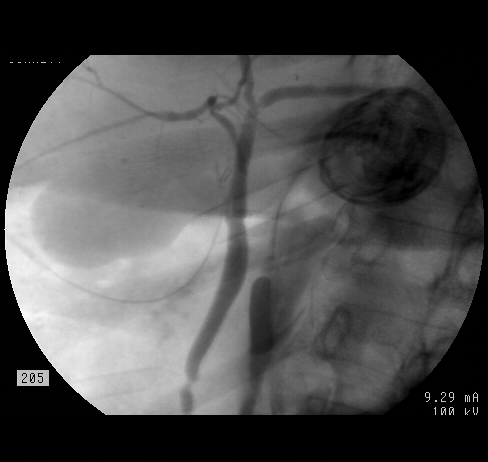
[im 10/15]
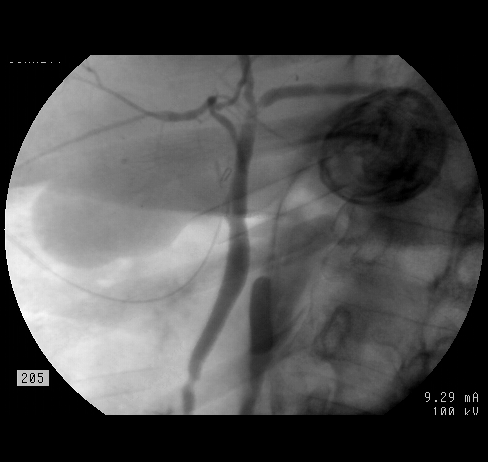
[im 11/15]
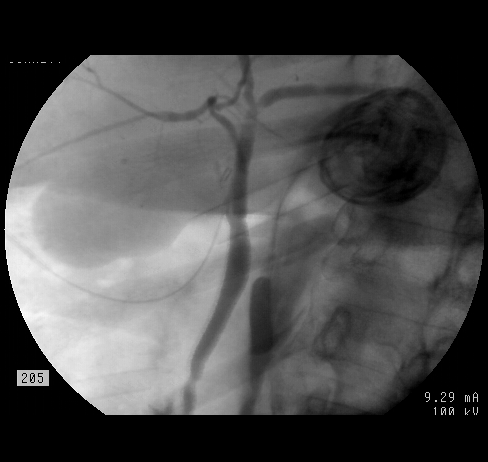
[im 12/15]
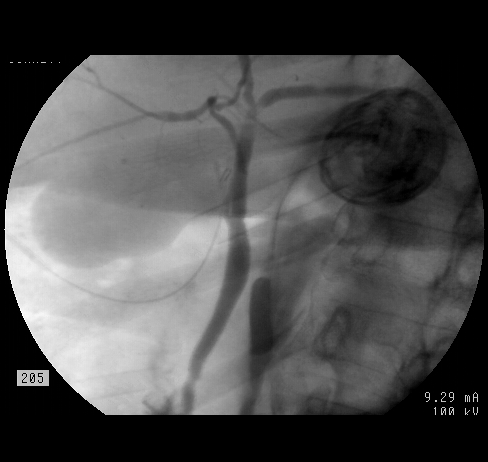
[im 13/15]
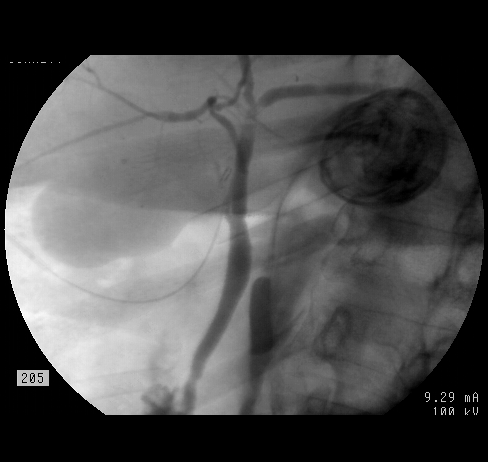
[im 14/15]
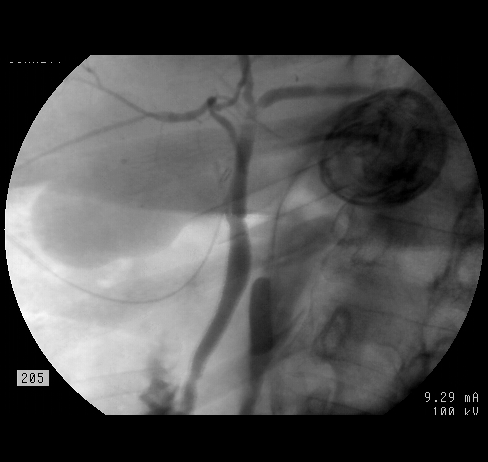
[im 15/15]
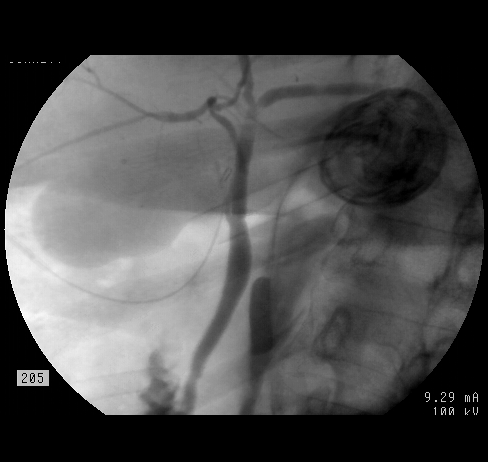

[15 of 15 positions shown; findings below may reference images not displayed]

FINDINGS: A single intraoperative fluoroscopic run submitted after the procedure for review.  This demonstrates cannulation of the cystic duct with injection of contrast into the biliary tree.  Mild distention in the extrahepatic bile duct is apparent without evidence for an intraluminal defect to suggest the presence of a retained stone.  Free flow of contrast into the duodenal C-loop is apparent.  There is no evidence for contrast extravasation to suggest the presence of a bile leak.
IMPRESSION: No evidence for retained common duct stone.

## 2006-06-28 ENCOUNTER — Encounter: Admission: RE | Admit: 2006-06-28 | Discharge: 2006-06-28 | Payer: Self-pay | Admitting: Family Medicine

## 2006-09-24 HISTORY — PX: BUNIONECTOMY: SHX129

## 2006-12-27 ENCOUNTER — Encounter: Admission: RE | Admit: 2006-12-27 | Discharge: 2006-12-27 | Payer: Self-pay | Admitting: Family Medicine

## 2007-01-06 ENCOUNTER — Ambulatory Visit: Payer: Self-pay | Admitting: Physical Medicine & Rehabilitation

## 2007-01-06 ENCOUNTER — Encounter
Admission: RE | Admit: 2007-01-06 | Discharge: 2007-04-06 | Payer: Self-pay | Admitting: Physical Medicine & Rehabilitation

## 2007-02-06 ENCOUNTER — Encounter
Admission: RE | Admit: 2007-02-06 | Discharge: 2007-05-07 | Payer: Self-pay | Admitting: Physical Medicine & Rehabilitation

## 2007-03-10 ENCOUNTER — Ambulatory Visit: Payer: Self-pay | Admitting: Physical Medicine & Rehabilitation

## 2007-05-30 IMAGING — CT CT PARANASAL SINUSES LIMITED
1 series · 16 of 28 positions shown, 20 images · IV contrast (agent unspecified)
Comparison: none

CLINICAL DATA: Continued symptoms after treatment for sinusitis.
 LIMITED CT OF THE PARANASAL SINUSES WITHOUT CONTRAST:
TECHNIQUE: Limited coronal CT images were obtained through the paranasal sinuses without intravenous contrast.

[Series 2: limited sinus prone · axial · 0.33mm/px · z∈[+18,+117]mm · 16 of 28 slices shown, 20 images]
[im 2/28  brain]
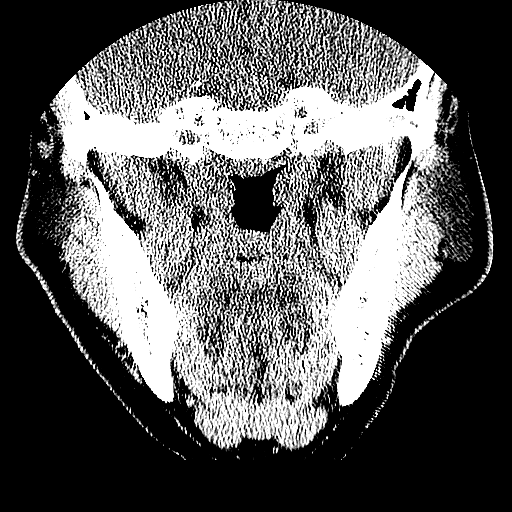
[im 2/28  bone]
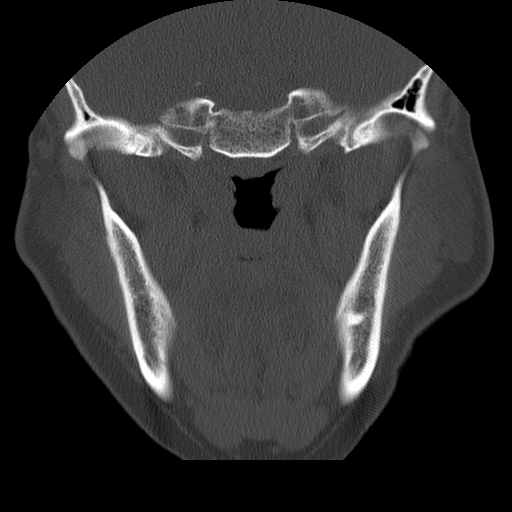
[im 4/28  bone]
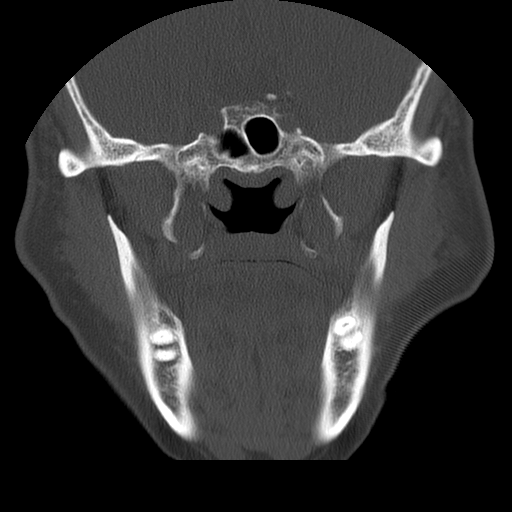
[im 6/28  bone]
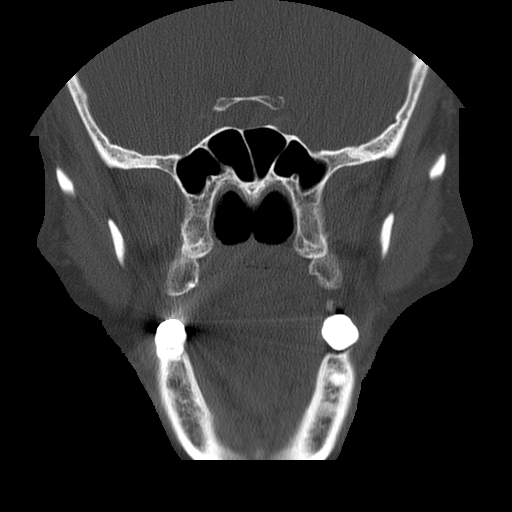
[im 7/28  bone]
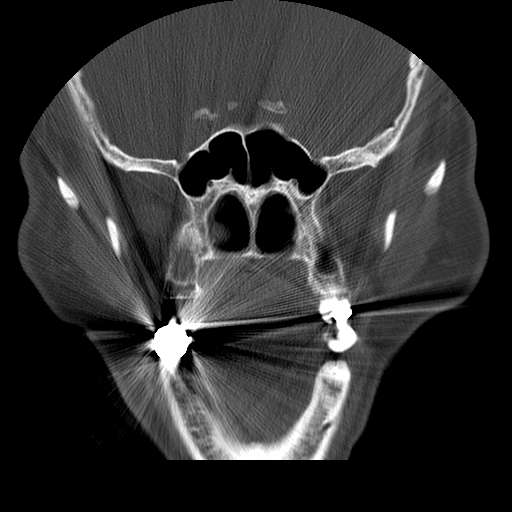
[im 9/28  brain]
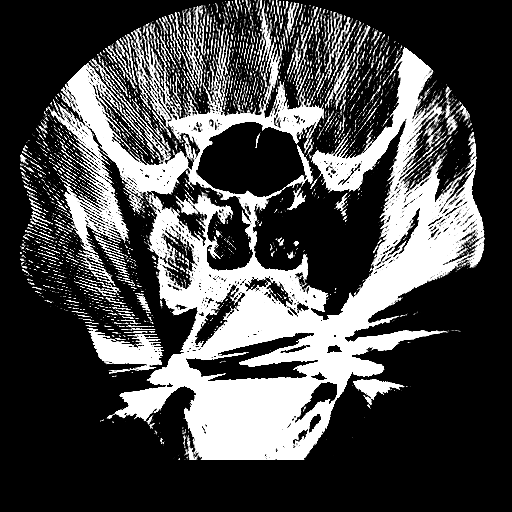
[im 9/28  bone]
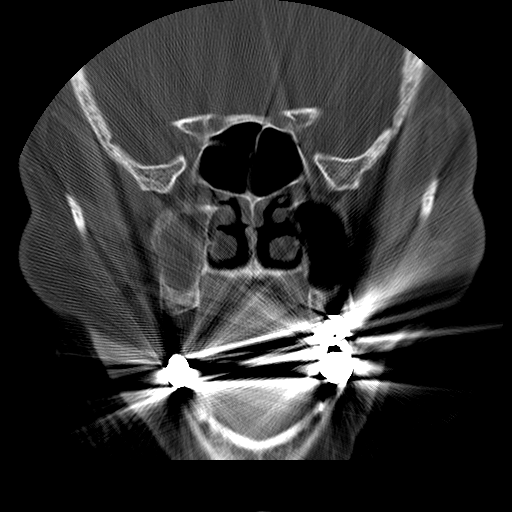
[im 10/28  bone]
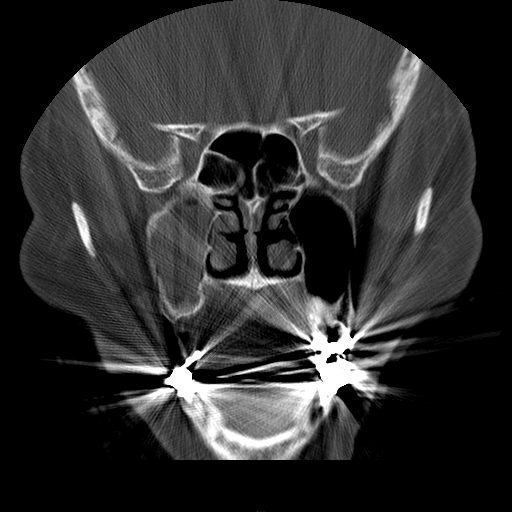
[im 12/28  bone]
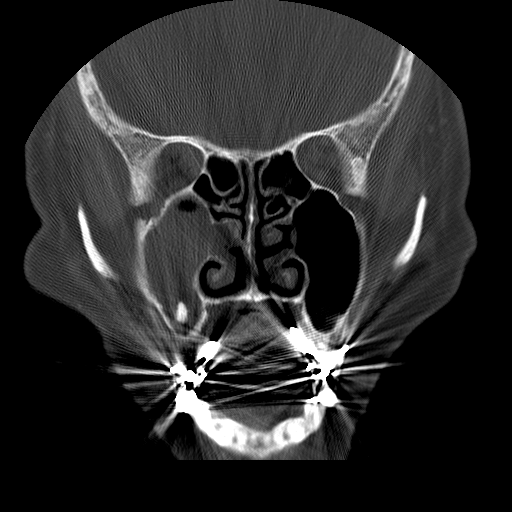
[im 14/28  bone]
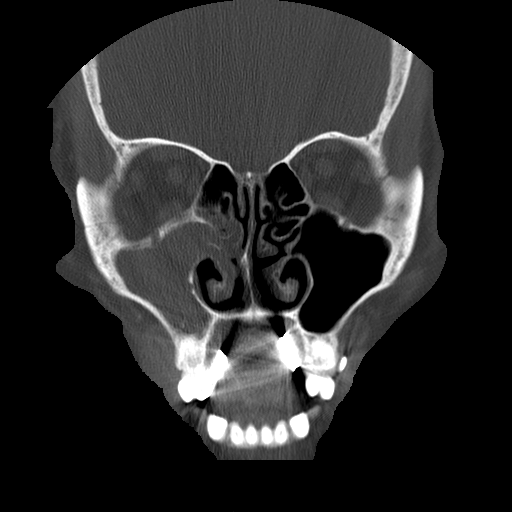
[im 15/28  brain]
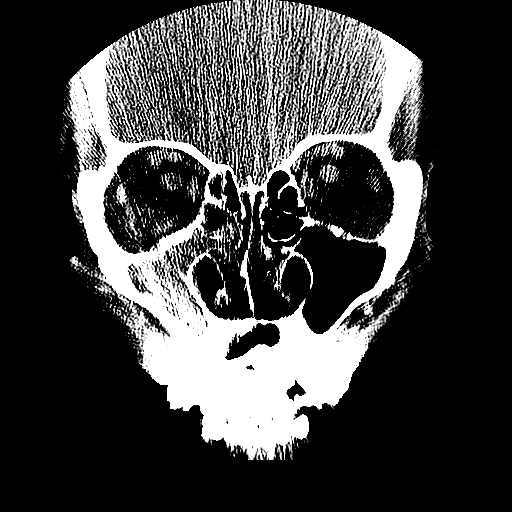
[im 15/28  bone]
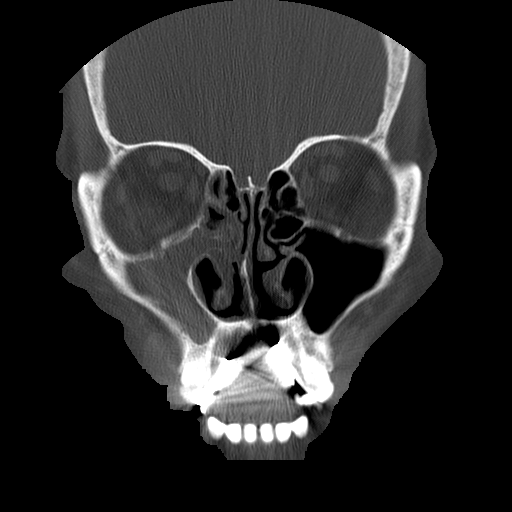
[im 17/28  bone]
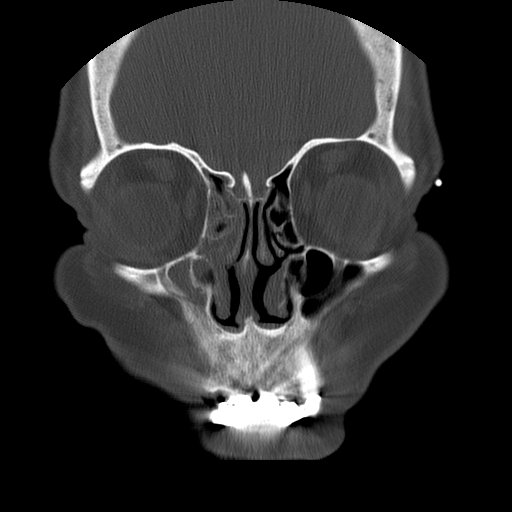
[im 19/28  bone]
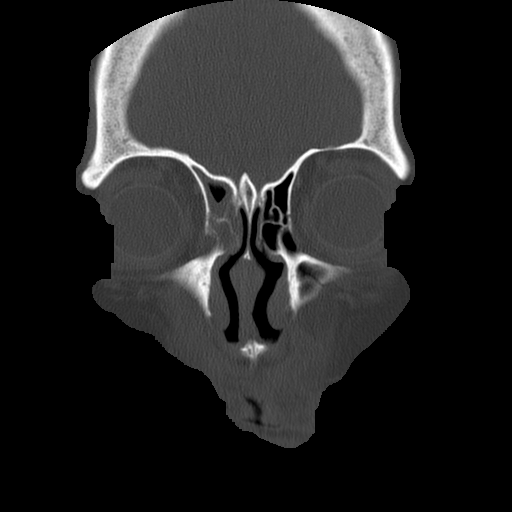
[im 20/28  bone]
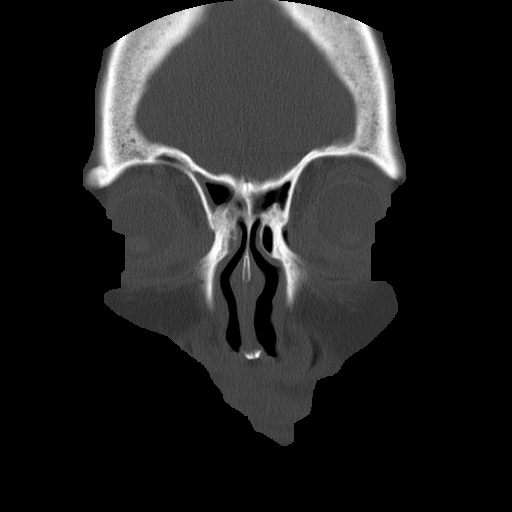
[im 22/28  brain]
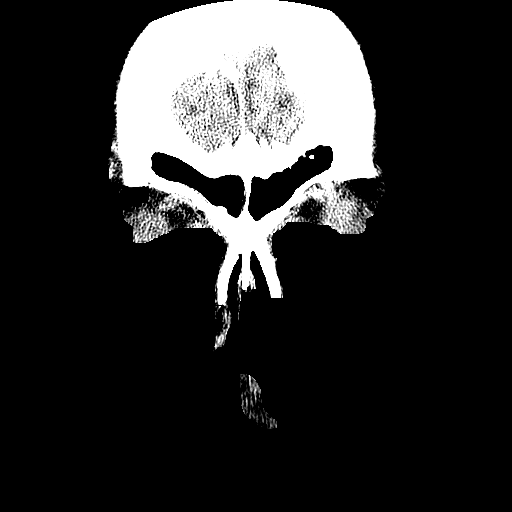
[im 22/28  bone]
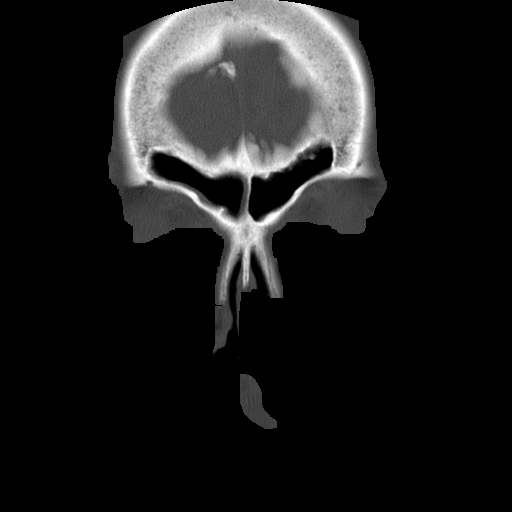
[im 23/28  bone]
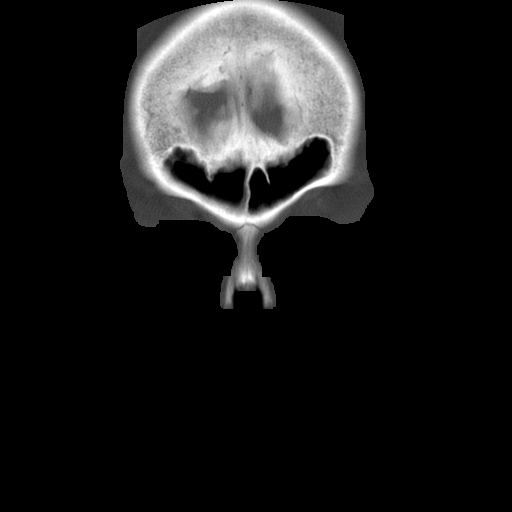
[im 25/28  bone]
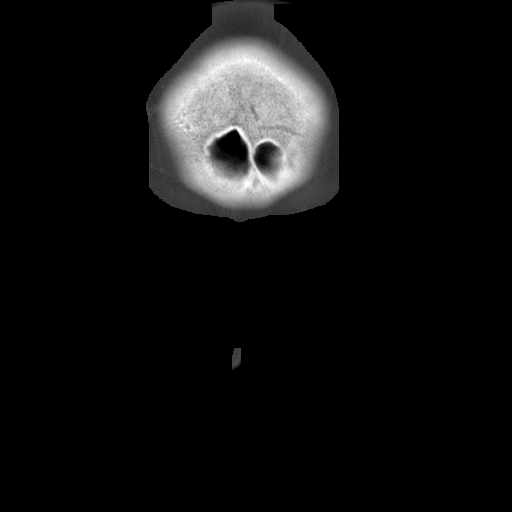
[im 27/28  bone]
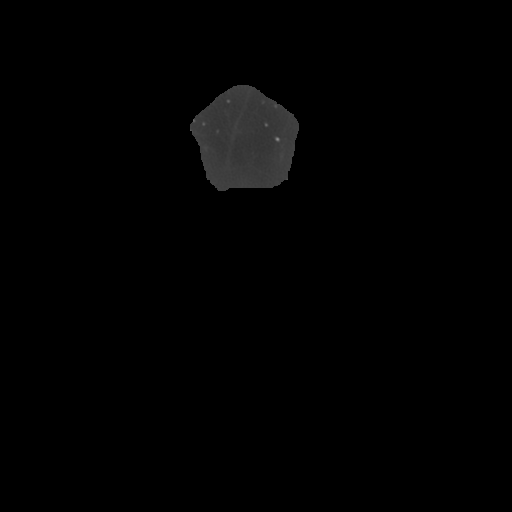

[16 of 28 positions shown; findings below may reference images not displayed]

FINDINGS: Sphenoid sinuses are clear.  The right maxillary, right ethmoid, and right frontal sinuses are clear.  There is complete opacification of the left maxillary sinus.  There is some calcification within the sinus consistent with mucoid calcification.  Left ethmoid and frontal sinuses show mucosal thickening.  Nasal septum is midline.
IMPRESSION: 1.  Complete opacification of the left maxillary sinus.
 2.  Mucosal thickening in the left ethmoid and frontal sinuses as well.

## 2007-06-30 ENCOUNTER — Encounter: Admission: RE | Admit: 2007-06-30 | Discharge: 2007-06-30 | Payer: Self-pay | Admitting: Family Medicine

## 2007-07-28 ENCOUNTER — Encounter: Admission: RE | Admit: 2007-07-28 | Discharge: 2007-07-28 | Payer: Self-pay | Admitting: Otolaryngology

## 2007-08-07 ENCOUNTER — Other Ambulatory Visit: Admission: RE | Admit: 2007-08-07 | Discharge: 2007-08-07 | Payer: Self-pay | Admitting: Family Medicine

## 2007-09-12 DIAGNOSIS — G4733 Obstructive sleep apnea (adult) (pediatric): Secondary | ICD-10-CM | POA: Insufficient documentation

## 2007-09-12 DIAGNOSIS — Z8679 Personal history of other diseases of the circulatory system: Secondary | ICD-10-CM | POA: Insufficient documentation

## 2007-09-12 DIAGNOSIS — R404 Transient alteration of awareness: Secondary | ICD-10-CM | POA: Insufficient documentation

## 2007-09-12 DIAGNOSIS — I10 Essential (primary) hypertension: Secondary | ICD-10-CM | POA: Insufficient documentation

## 2007-09-12 DIAGNOSIS — J309 Allergic rhinitis, unspecified: Secondary | ICD-10-CM | POA: Insufficient documentation

## 2007-12-10 ENCOUNTER — Encounter: Admission: RE | Admit: 2007-12-10 | Discharge: 2007-12-10 | Payer: Self-pay | Admitting: Otolaryngology

## 2007-12-29 IMAGING — CT CT PARANASAL SINUSES LIMITED
1 series · 16 of 20 positions shown, 20 images · IV contrast (agent unspecified)
Comparison: 12/27/06

CLINICAL DATA: Chest congestion and pressure.  Headaches.
 LIMITED CT SCAN OF THE SINUSES WITHOUT CONTRAST:
TECHNIQUE: Limited coronal CT images were obtained through the paranasal sinuses without intravenous contrast.

[Series 2: limited sinus prone · axial · 0.33mm/px · z∈[+9,+94]mm · 16 of 20 slices shown, 20 images]
[im 2/20  brain]
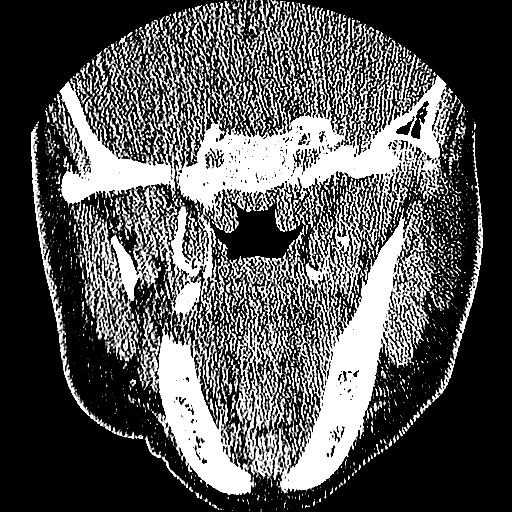
[im 2/20  bone]
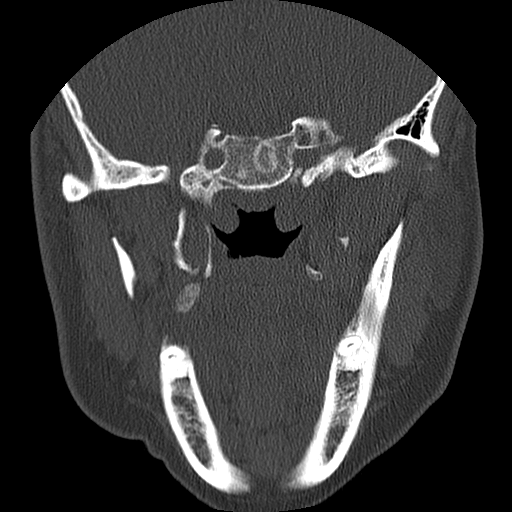
[im 3/20  bone]
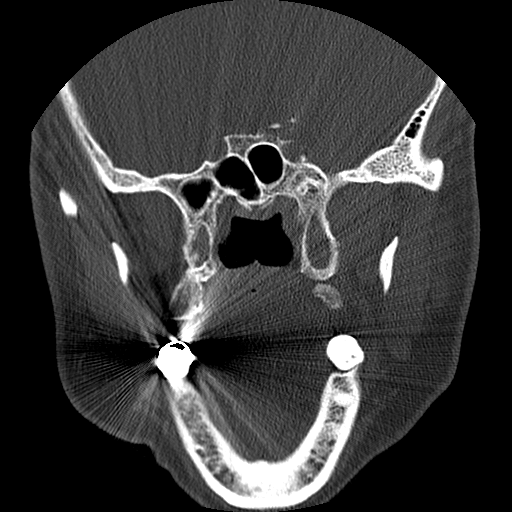
[im 4/20  bone]
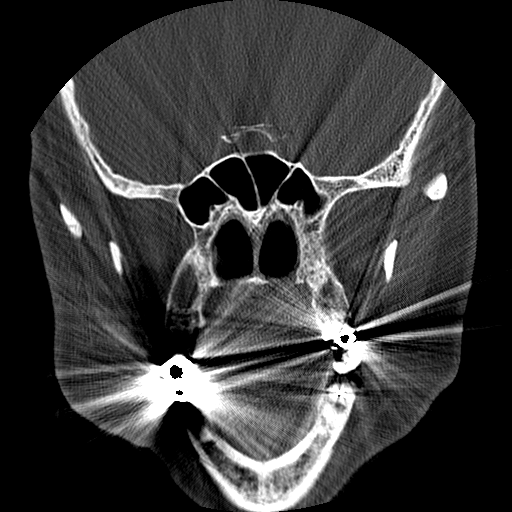
[im 5/20  bone]
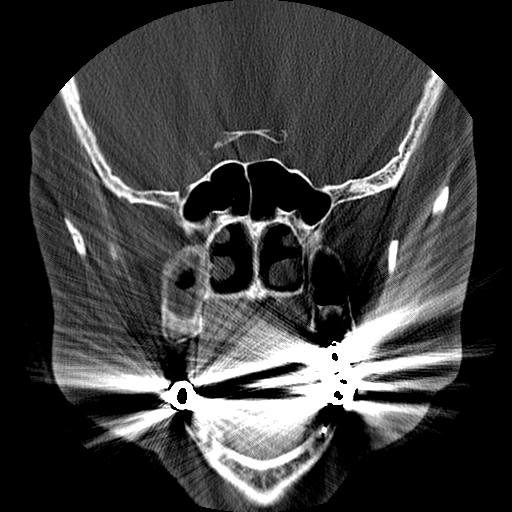
[im 7/20  brain]
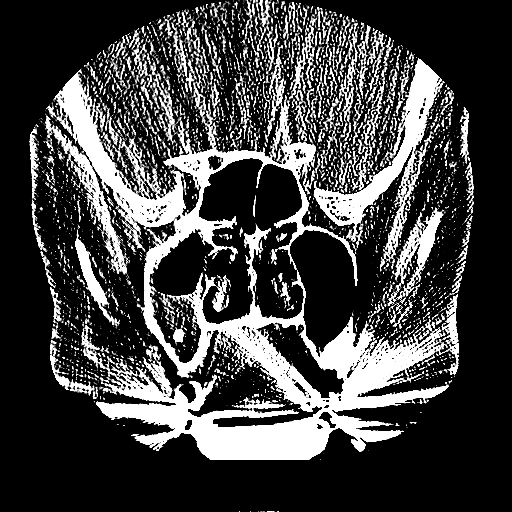
[im 7/20  bone]
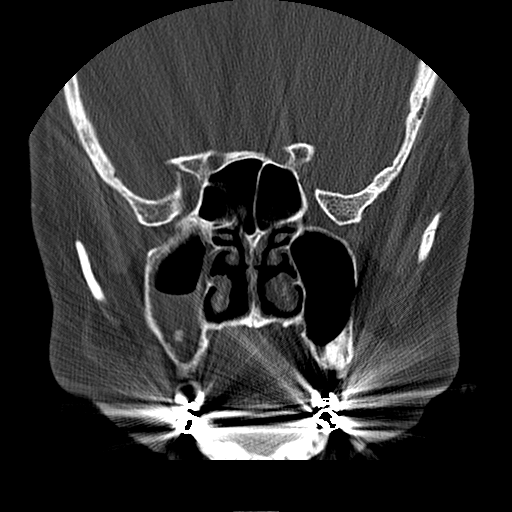
[im 8/20  bone]
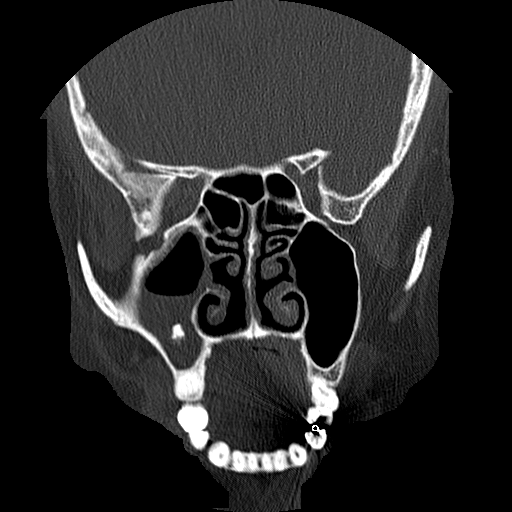
[im 9/20  bone]
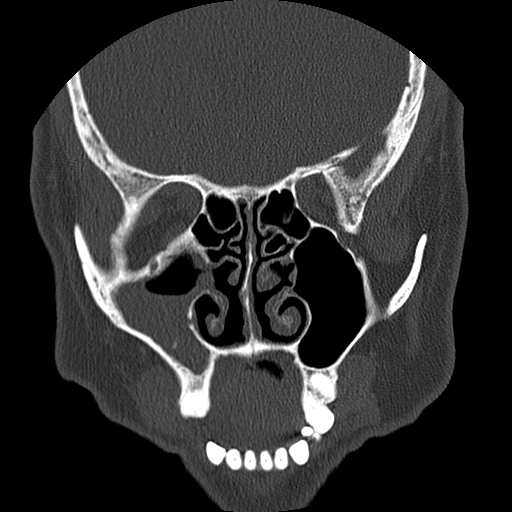
[im 10/20  bone]
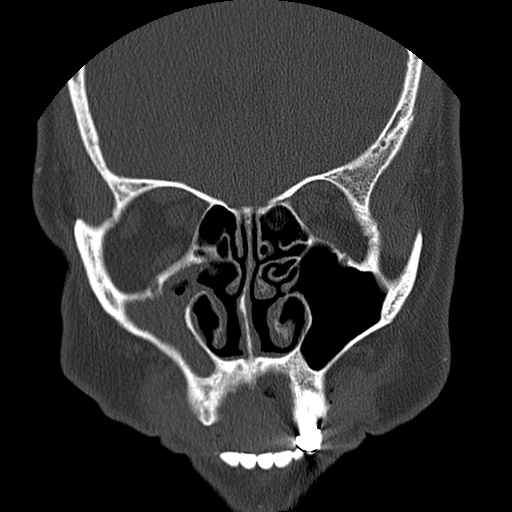
[im 11/20  brain]
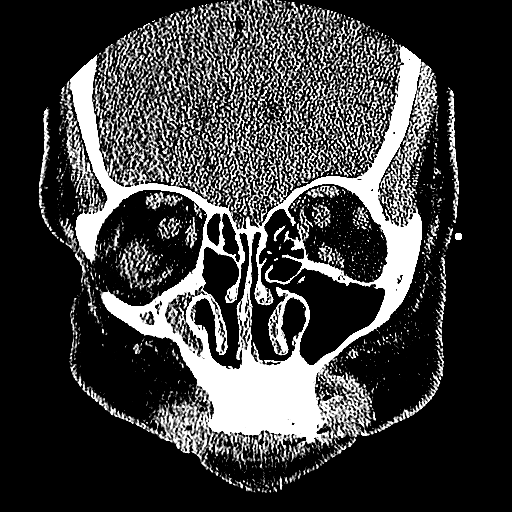
[im 11/20  bone]
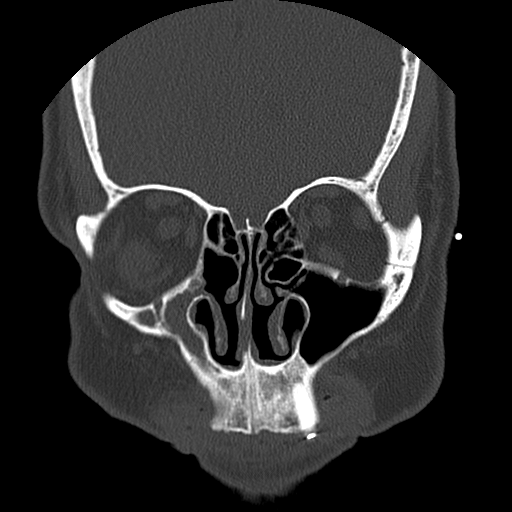
[im 12/20  bone]
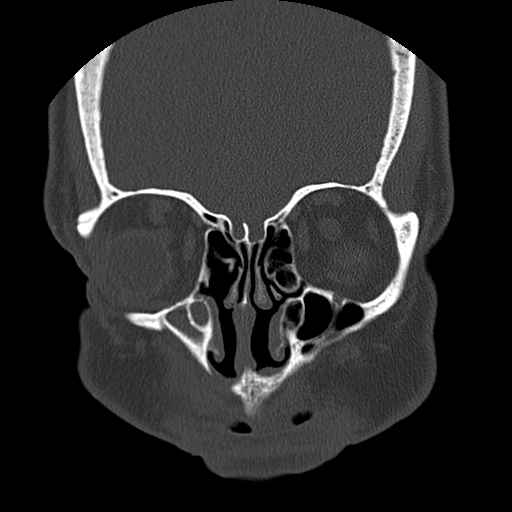
[im 13/20  bone]
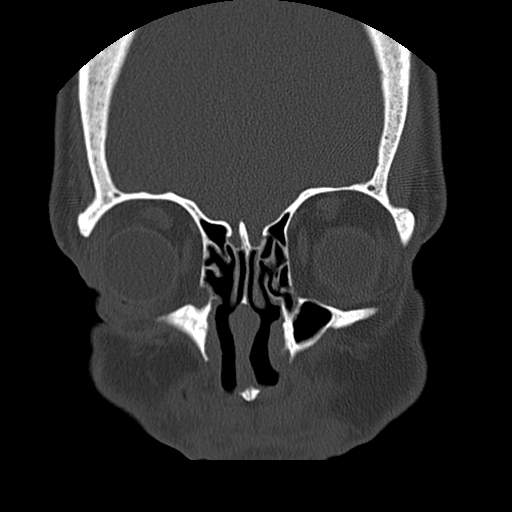
[im 14/20  bone]
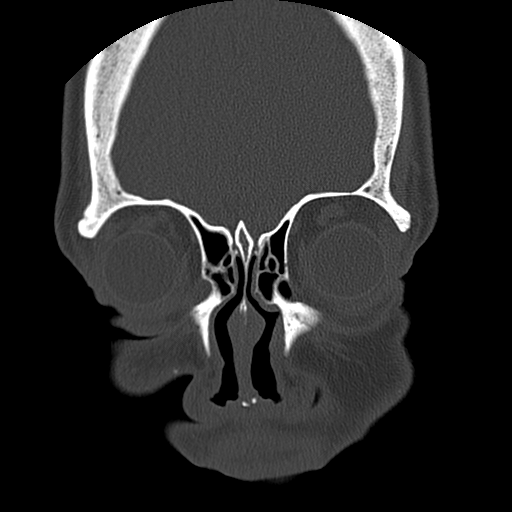
[im 16/20  brain]
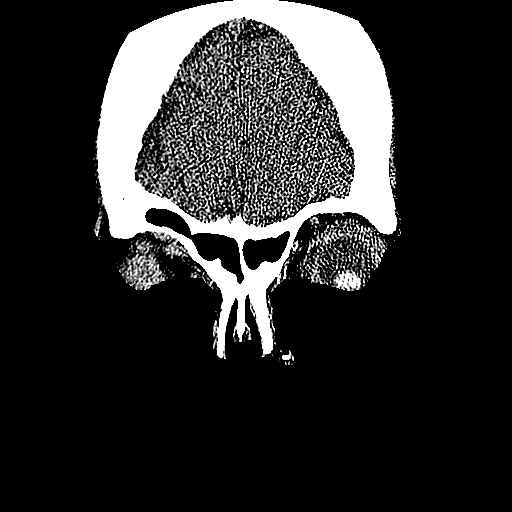
[im 16/20  bone]
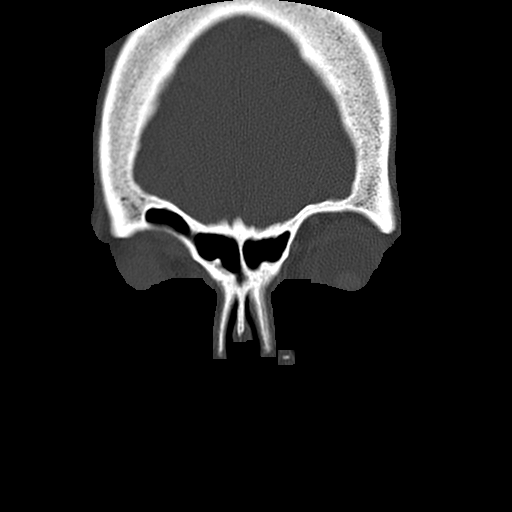
[im 17/20  bone]
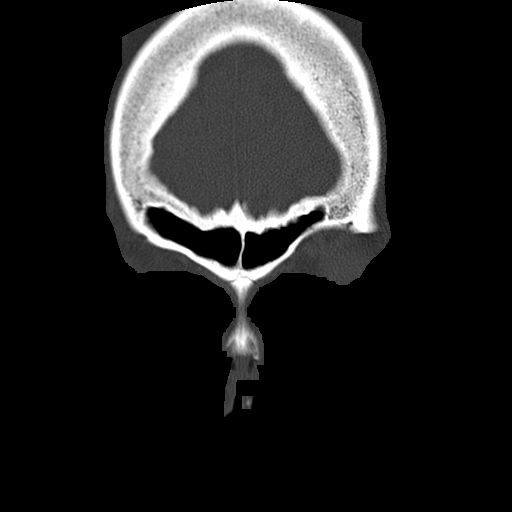
[im 18/20  bone]
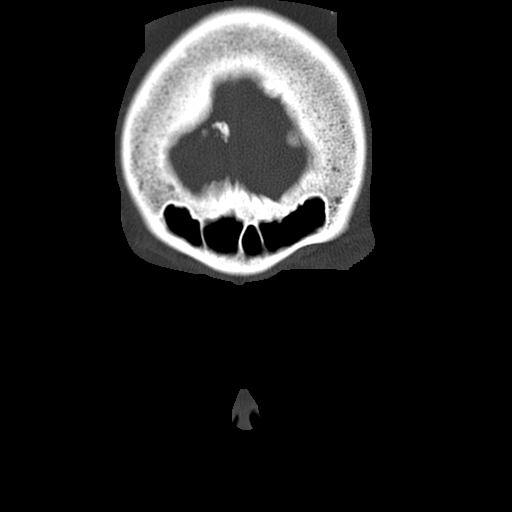
[im 19/20  bone]
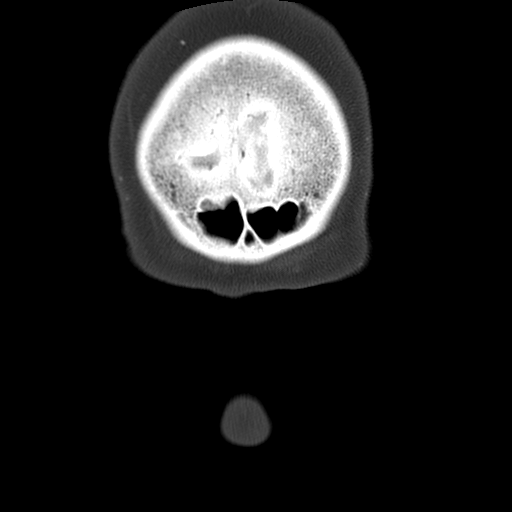

[16 of 20 positions shown; findings below may reference images not displayed]

FINDINGS: The frontal, ethmoid, sphenoid and right maxillary sinus are all clear.  The patient appears to have had some decompressive surgery in the region of the ostiomeatal complex on the left since the previous study.  The left maxillary sinus continues to show mucosal thickening with fluid accumulation.
IMPRESSION: Left maxillary sinusitis with mucosal thickening and fluid accumulation despite what appears to be interval decompressive surgery in the region of the left ostiomeatal complex

## 2007-12-30 ENCOUNTER — Encounter: Admission: RE | Admit: 2007-12-30 | Discharge: 2007-12-30 | Payer: Self-pay | Admitting: Orthopedic Surgery

## 2008-01-12 ENCOUNTER — Encounter: Payer: Self-pay | Admitting: Pulmonary Disease

## 2008-01-12 ENCOUNTER — Encounter: Admission: RE | Admit: 2008-01-12 | Discharge: 2008-01-12 | Payer: Self-pay | Admitting: Family Medicine

## 2008-01-21 ENCOUNTER — Ambulatory Visit: Payer: Self-pay | Admitting: Pulmonary Disease

## 2008-01-23 ENCOUNTER — Ambulatory Visit: Payer: Self-pay | Admitting: Pulmonary Disease

## 2008-01-23 DIAGNOSIS — R059 Cough, unspecified: Secondary | ICD-10-CM | POA: Insufficient documentation

## 2008-01-23 DIAGNOSIS — R0602 Shortness of breath: Secondary | ICD-10-CM | POA: Insufficient documentation

## 2008-01-23 DIAGNOSIS — R05 Cough: Secondary | ICD-10-CM

## 2008-02-03 ENCOUNTER — Ambulatory Visit: Payer: Self-pay | Admitting: Pulmonary Disease

## 2008-02-10 ENCOUNTER — Telehealth: Payer: Self-pay | Admitting: Pulmonary Disease

## 2008-02-12 ENCOUNTER — Ambulatory Visit: Payer: Self-pay | Admitting: Pulmonary Disease

## 2008-03-08 ENCOUNTER — Encounter: Payer: Self-pay | Admitting: Pulmonary Disease

## 2008-03-31 IMAGING — MG MM SCREEN MAMMOGRAM BILATERAL
7 series · 7 of 7 positions shown · non-contrast
Comparison: none

DG SCREEN MAMMOGRAM BILATERAL
Bilateral CC and MLO view(s) were taken.
Prior study comparison: April 18, 2004, bilateral screening mammogram.

DIGITAL SCREENING MAMMOGRAM WITH CAD:
There are scattered fibroglandular densities.  There is no dominant mass, architectural distortion 
or calcification to suggest malignancy.

[R CC (1 of 2)]
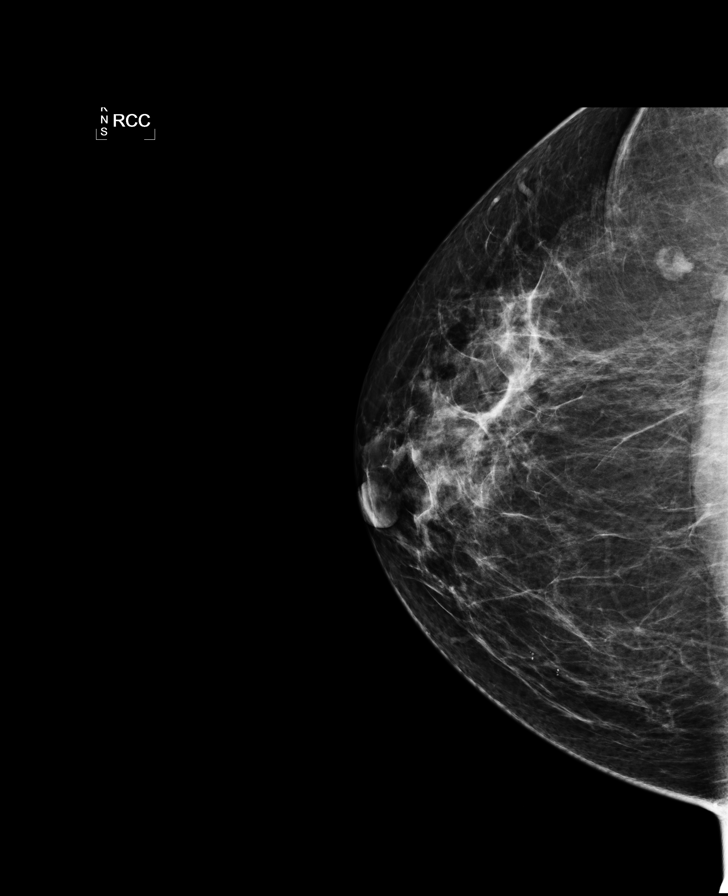

[L CC]
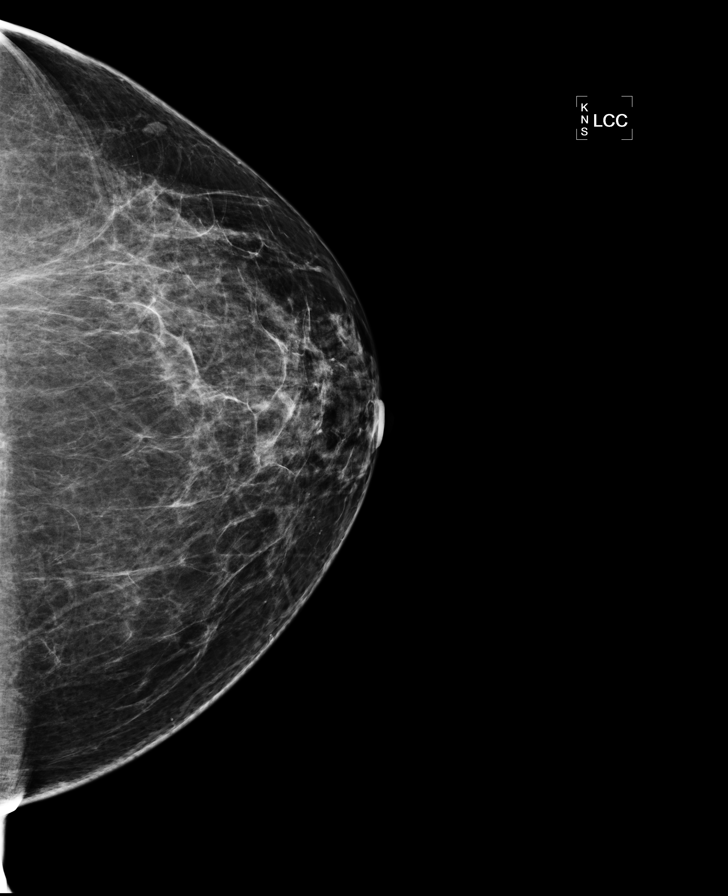

[L MLO (1 of 2)]
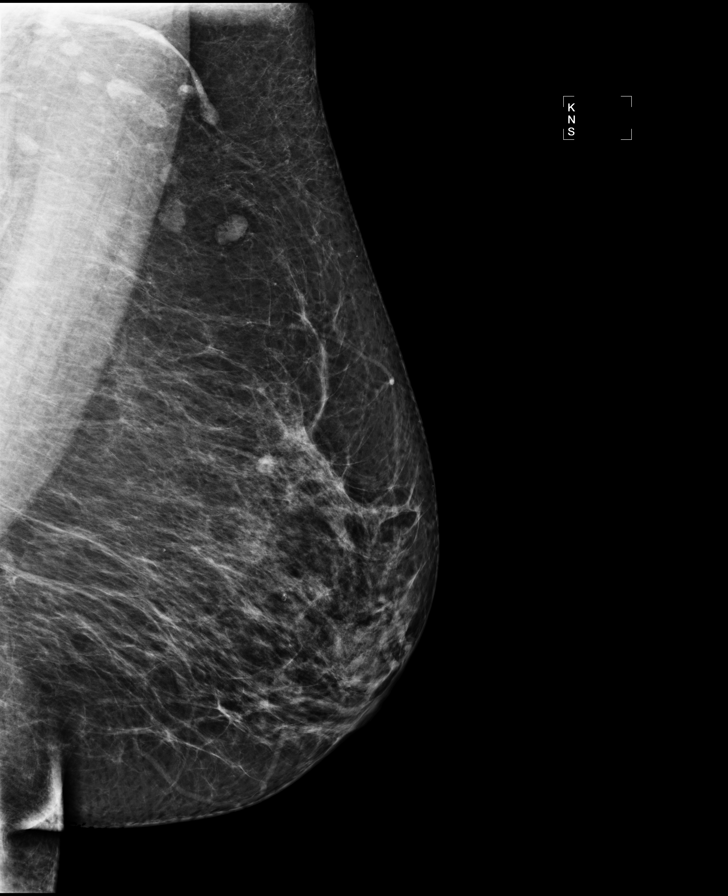

[R MLO (1 of 2)]
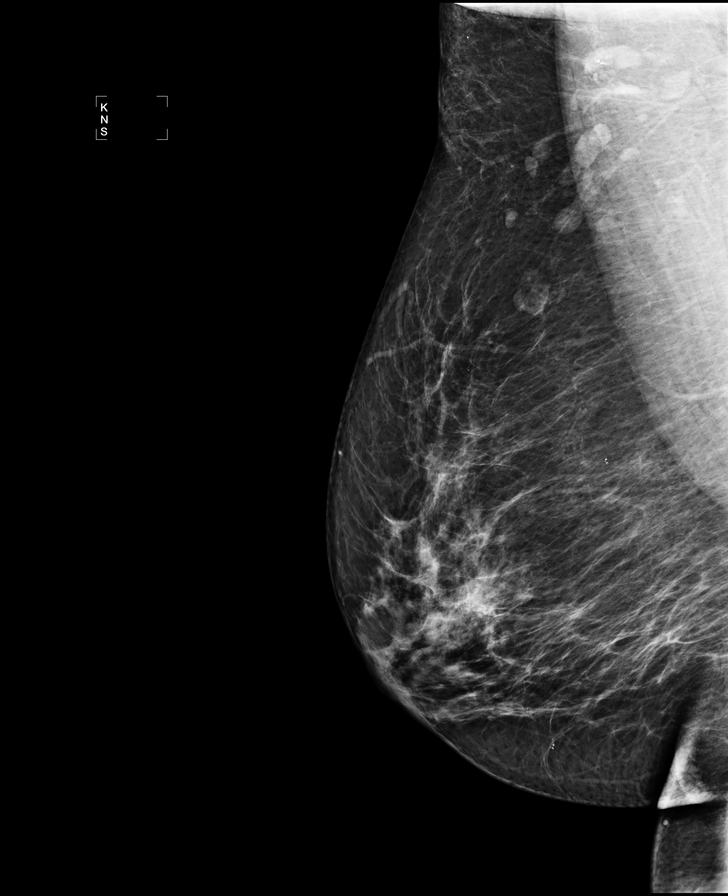

[R CC (2 of 2)]
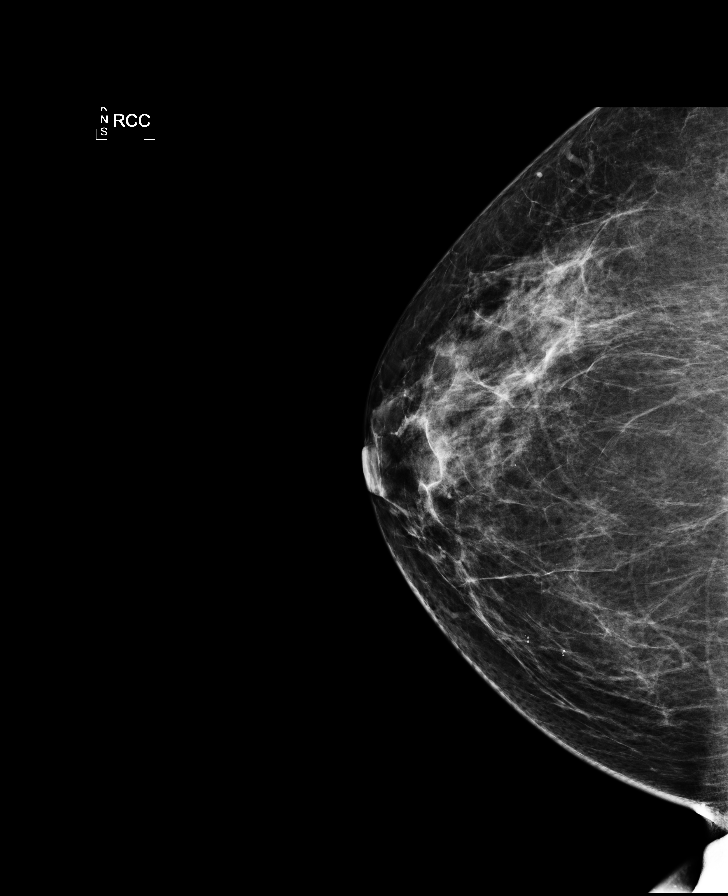

[R MLO (2 of 2)]
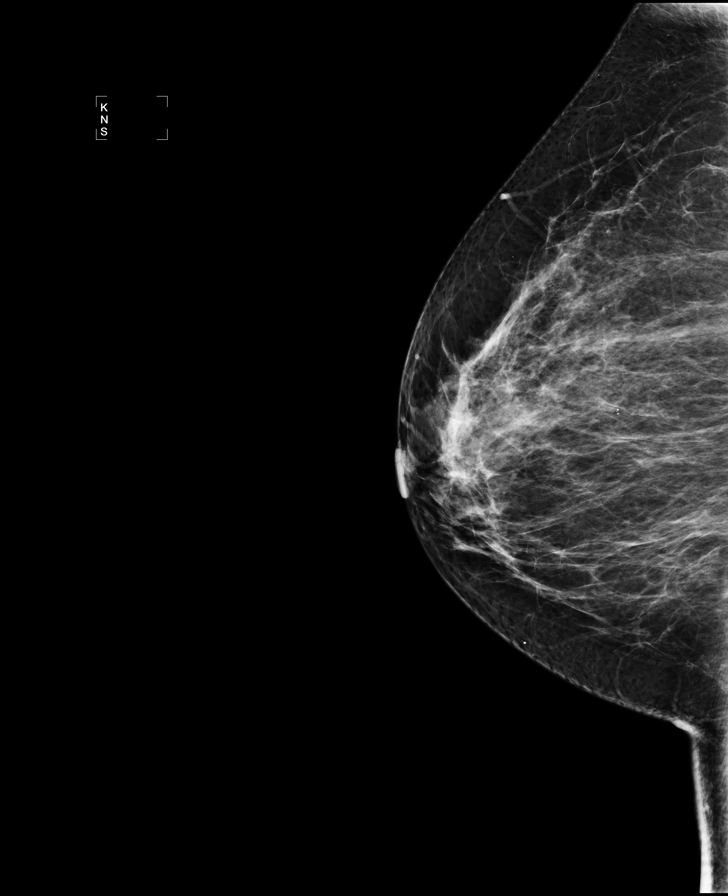

[L MLO (2 of 2)]
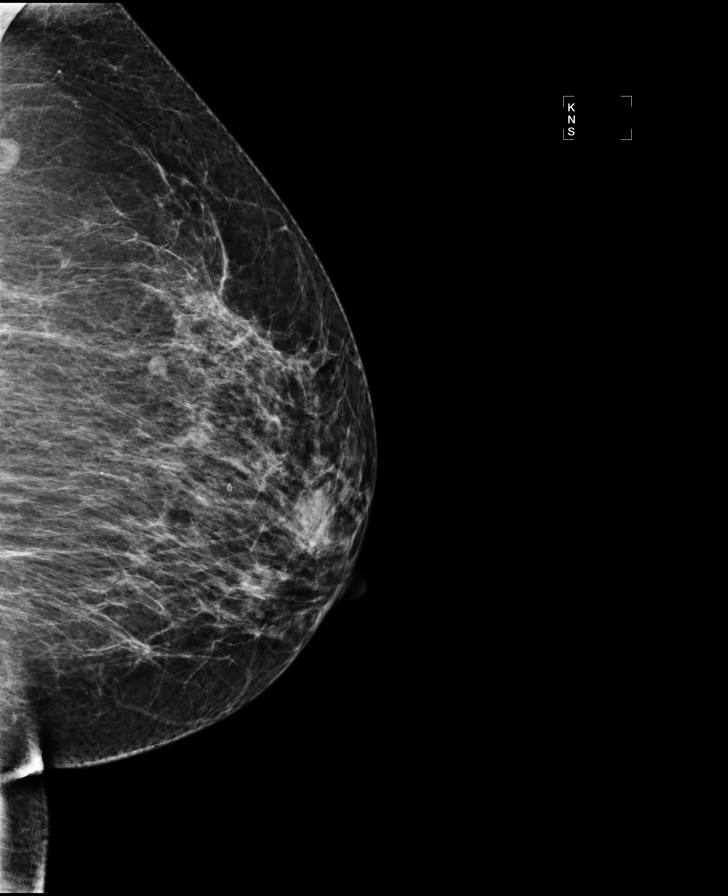

[7 of 7 positions shown; findings below may reference images not displayed]

IMPRESSION: No mammographic evidence of malignancy.  Suggest yearly screening mammography.

ASSESSMENT: Negative - BI-RADS 1

Screening mammogram in 1 year.
ANALYZED BY COMPUTER AIDED DETECTION. , THIS PROCEDURE WAS A DIGITAL MAMMOGRAM.

## 2008-04-12 ENCOUNTER — Encounter: Payer: Self-pay | Admitting: Pulmonary Disease

## 2008-05-12 IMAGING — CT CT PARANASAL SINUSES LIMITED
1 series · 16 of 22 positions shown, 20 images · IV contrast (agent unspecified)
Comparison: none

CLINICAL DATA: Chronic congestion.  Sinus infections.  
 LIMITED CT SCAN OF THE SINUSES WITHOUT CONTRAST:
TECHNIQUE: Limited coronal CT images were obtained through the paranasal sinuses without intravenous contrast.

[Series 2: limited sinus prone · axial · 0.33mm/px · z∈[+2,+97]mm · 16 of 22 slices shown, 20 images]
[im 2/22  brain]
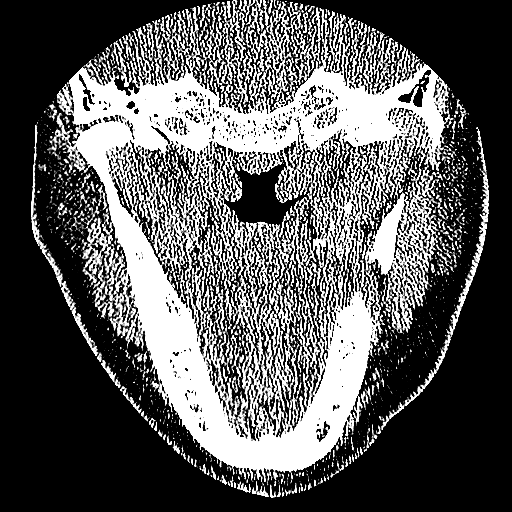
[im 2/22  bone]
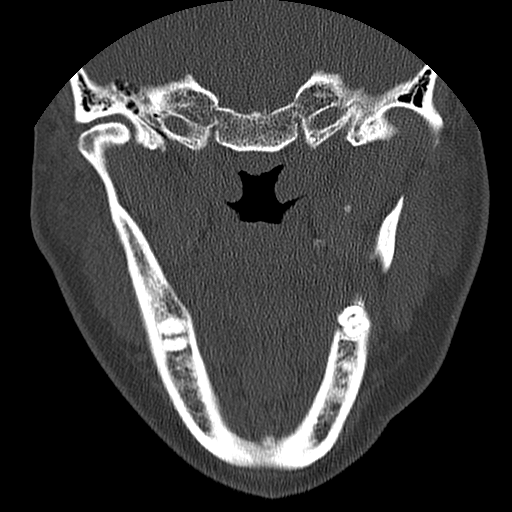
[im 3/22  bone]
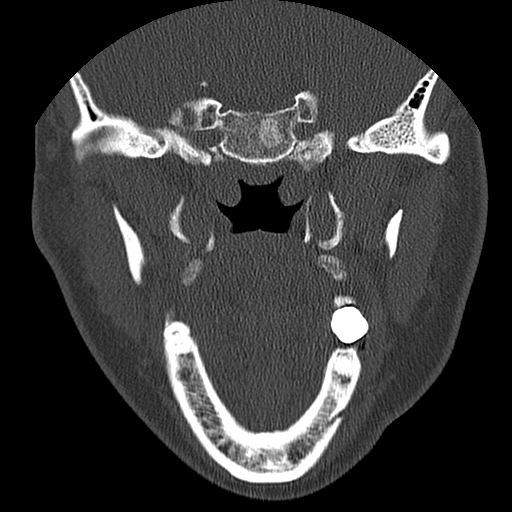
[im 5/22  bone]
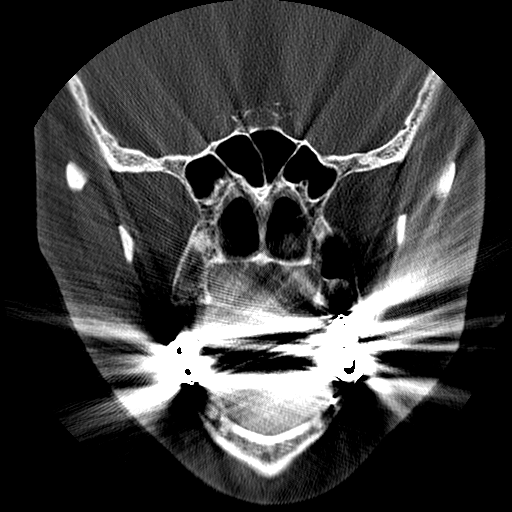
[im 6/22  bone]
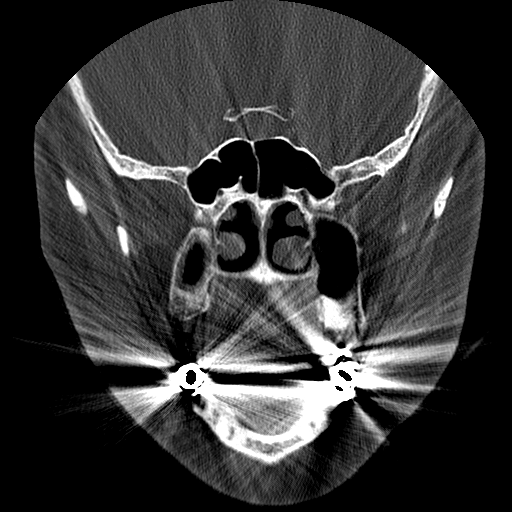
[im 7/22  brain]
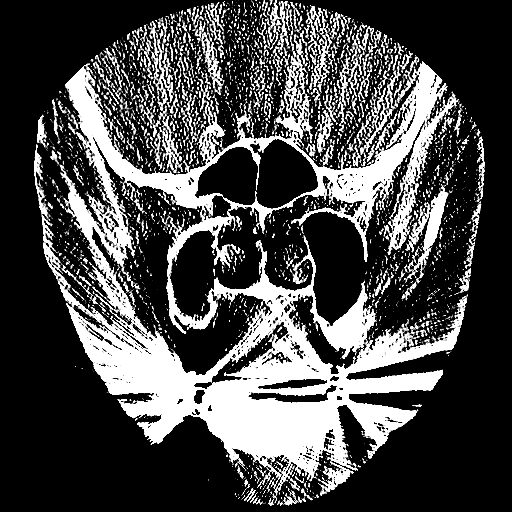
[im 7/22  bone]
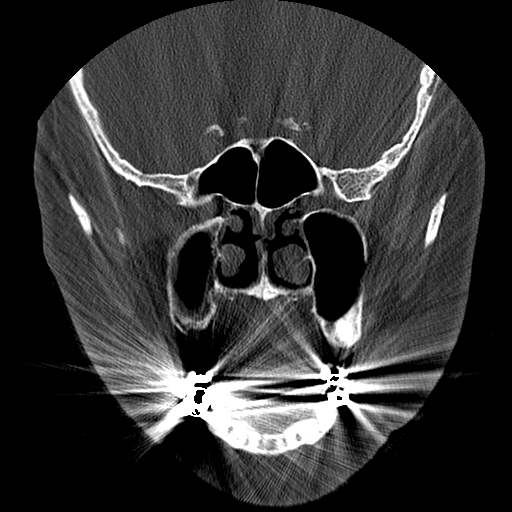
[im 8/22  bone]
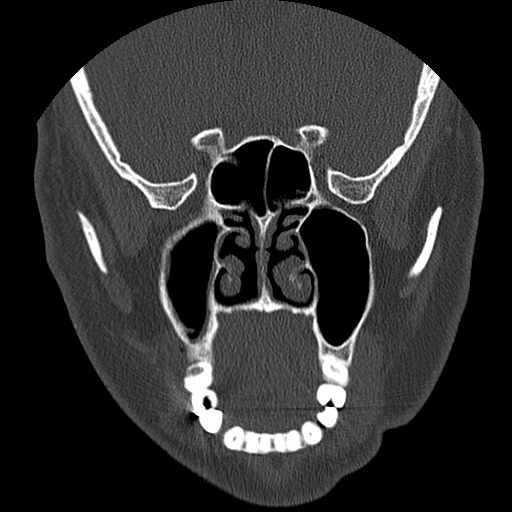
[im 10/22  bone]
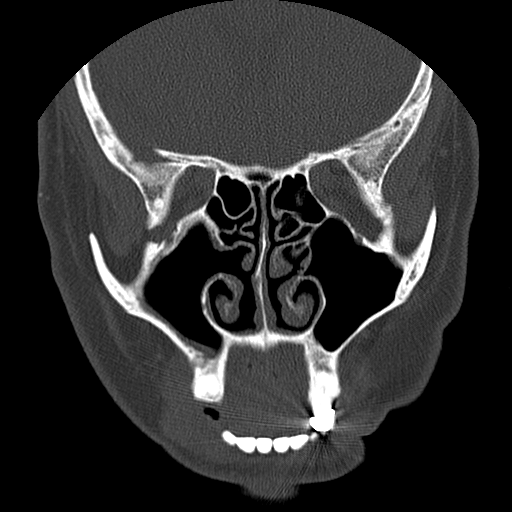
[im 11/22  bone]
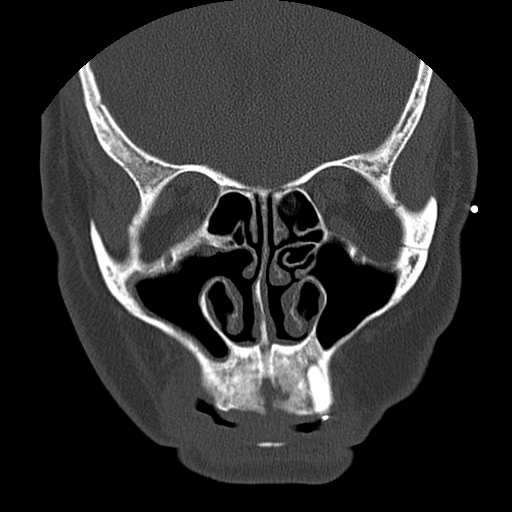
[im 12/22  brain]
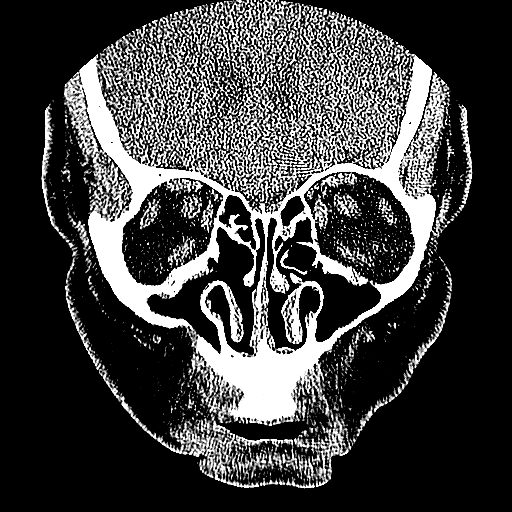
[im 12/22  bone]
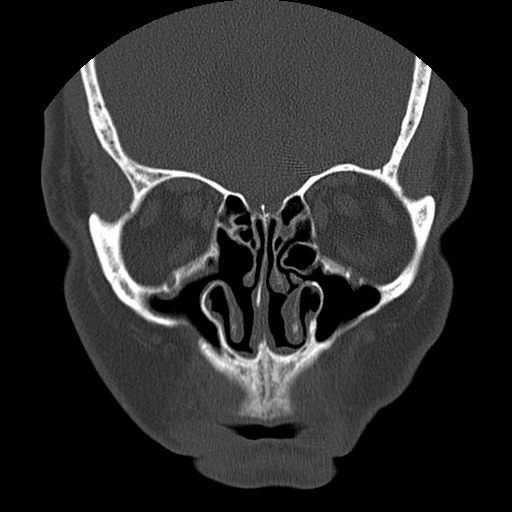
[im 13/22  bone]
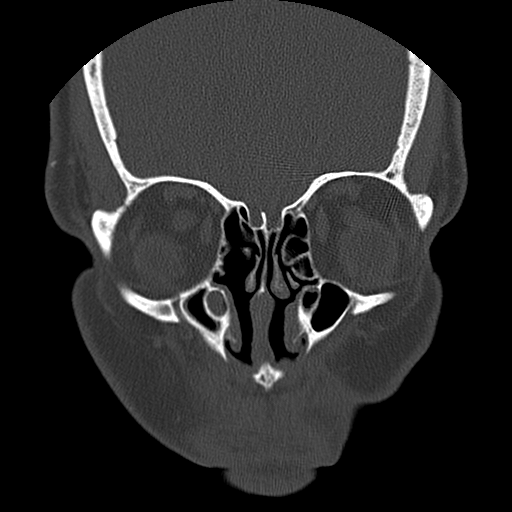
[im 15/22  bone]
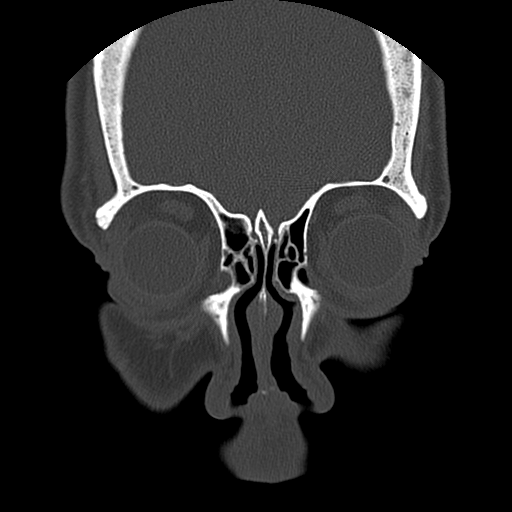
[im 16/22  bone]
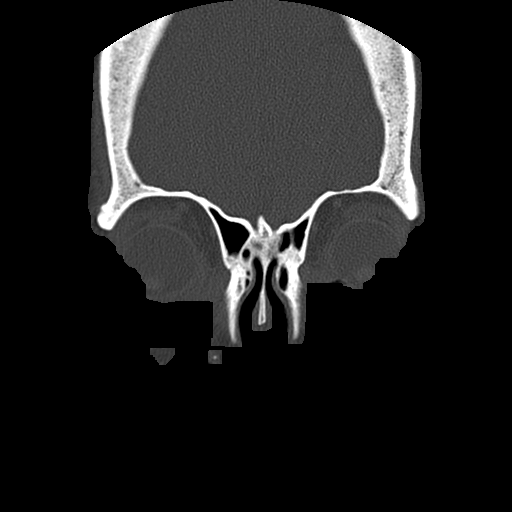
[im 17/22  brain]
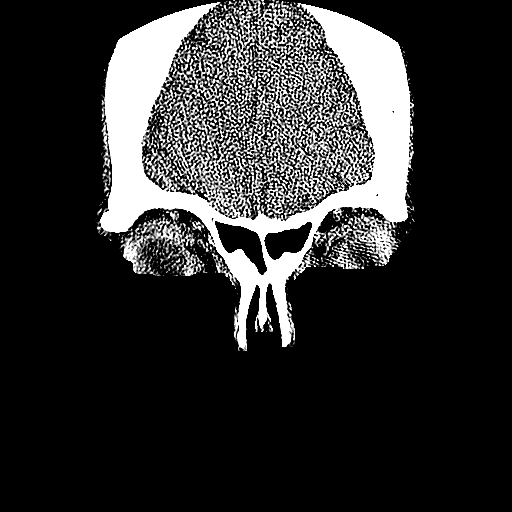
[im 17/22  bone]
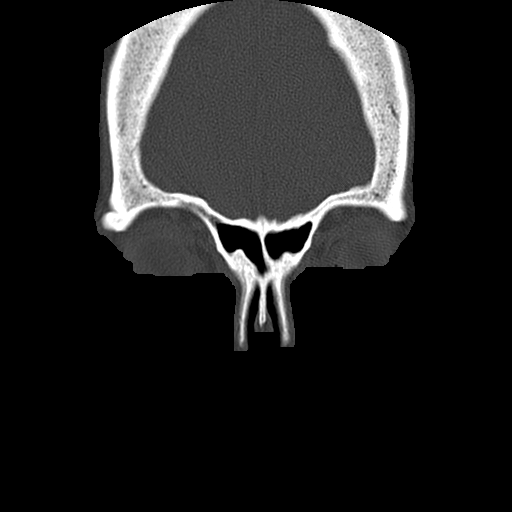
[im 18/22  bone]
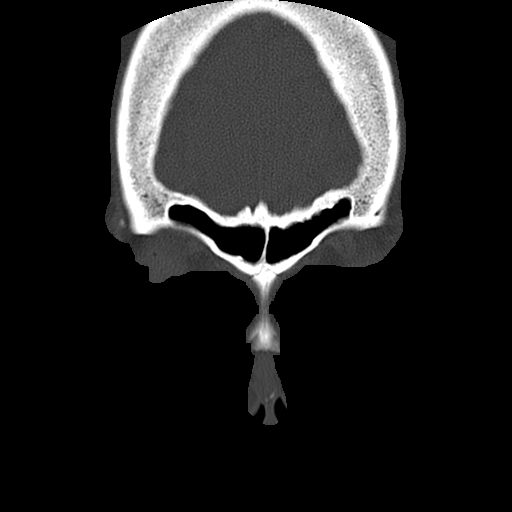
[im 20/22  bone]
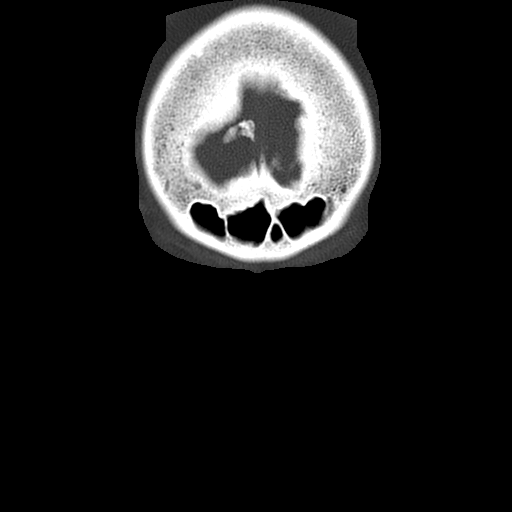
[im 21/22  bone]
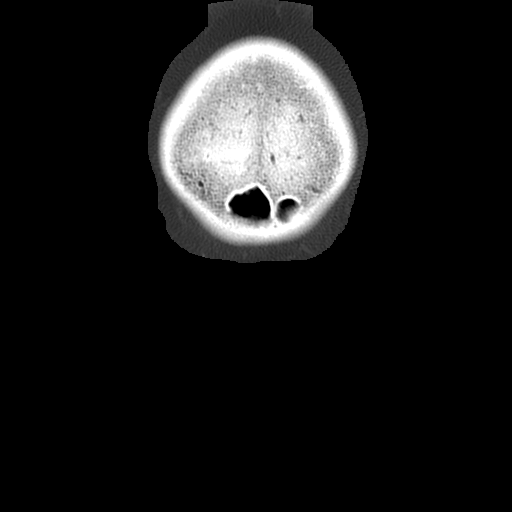

[16 of 22 positions shown; findings below may reference images not displayed]

FINDINGS: There is mild mucosal thickening in the left maxillary sinus.  However, no air fluid level is seen to indicate current sinusitis.  There does appear to have been a left medial antrostomy and partial left ethmoidectomy performed.  The nasal turbinates are normal in size and position, and the nasal airway is patent.  No bony abnormality is seen.
IMPRESSION: 1.  Mild mucosal thickening in the left maxillary sinus.  No sinusitis.  
 2.  Changes of left medial antrostomy and a partial left ethmoidectomy.

## 2008-06-01 IMAGING — CT CT CHEST W/O CM
4 of 7 series · 17 of 36 positions shown, 19 images · non-contrast
Comparison: None

CLINICAL DATA: Right shoulder and right sternal pain, cough, former
smoker

CT CHEST WITHOUT CONTRAST
TECHNIQUE: Multidetector CT imaging of the chest was performed
following the standard protocol without IV contrast.

[Series 2: routine chest · axial · 0.70mm/px · z∈[-236,-72]mm · 4 of 56 slices shown]
[im 12/56  lung]
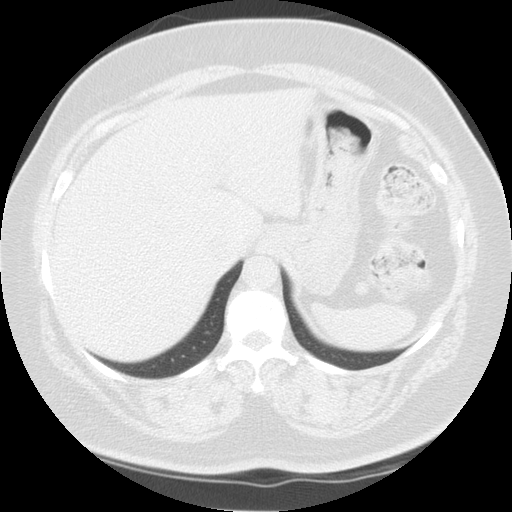
[im 23/56  lung]
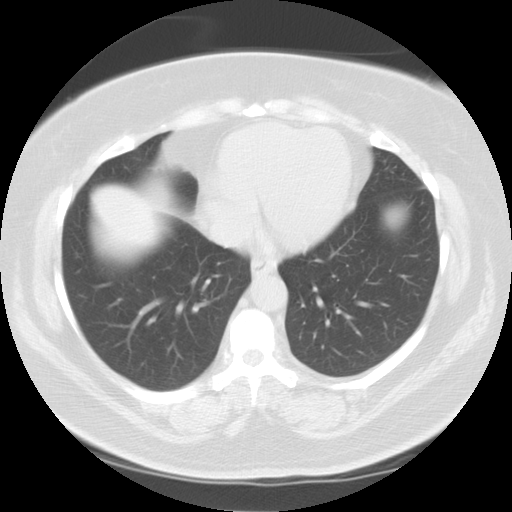
[im 34/56  lung]
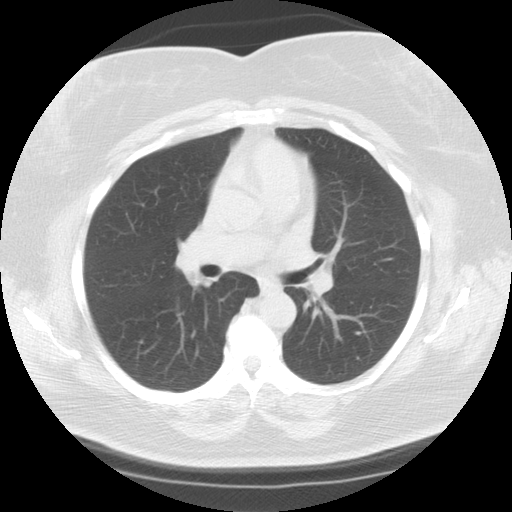
[im 45/56  lung]
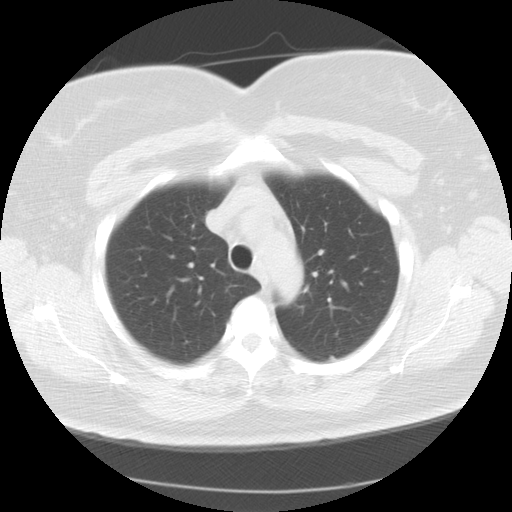

[Series 3: lung windows · axial · 0.70mm/px · z∈[-246,-52]mm · 5 of 59 slices shown, 7 images]
[im 10/59  mediastinal]
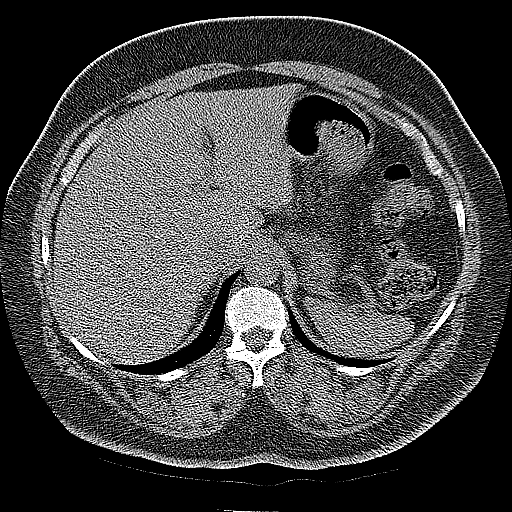
[im 10/59  lung]
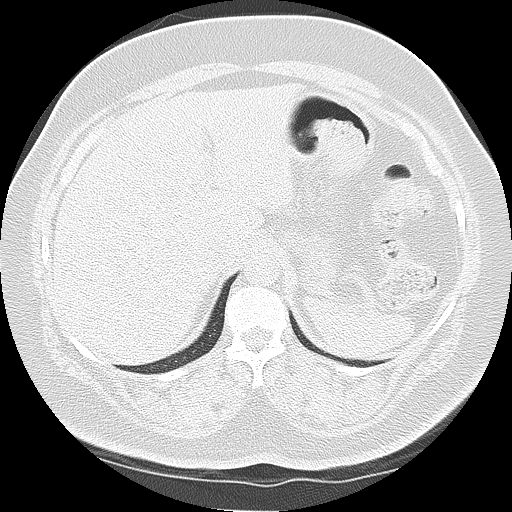
[im 20/59  lung]
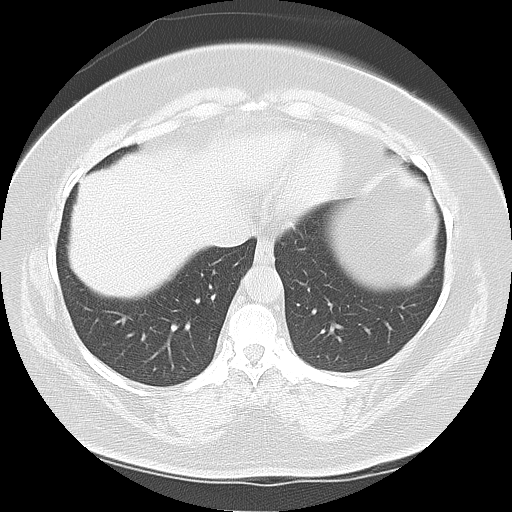
[im 30/59  lung]
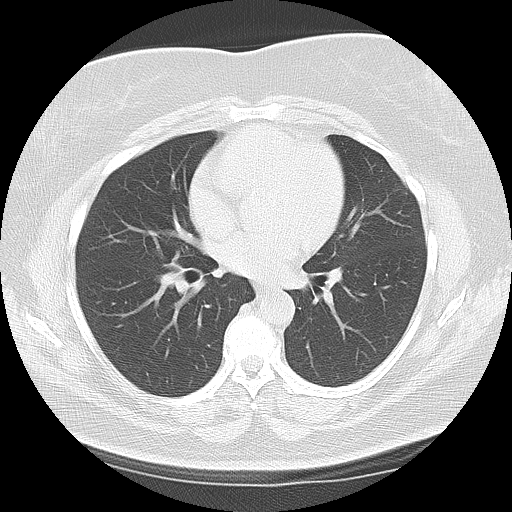
[im 39/59  lung]
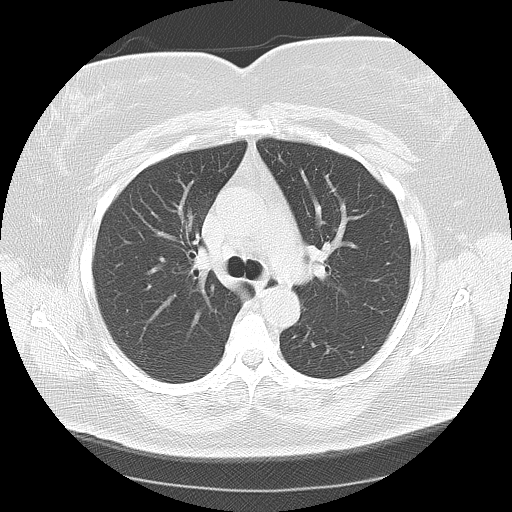
[im 49/59  mediastinal]
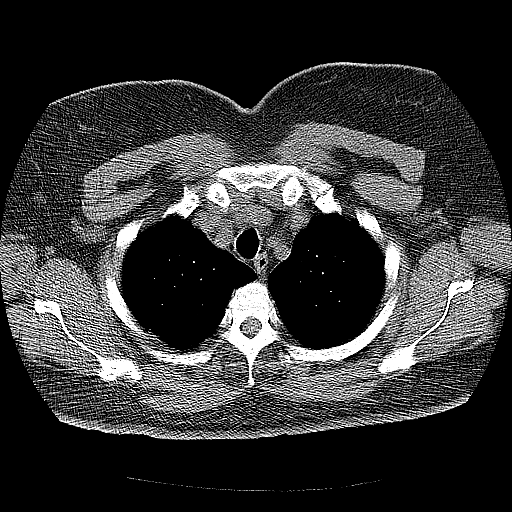
[im 49/59  lung]
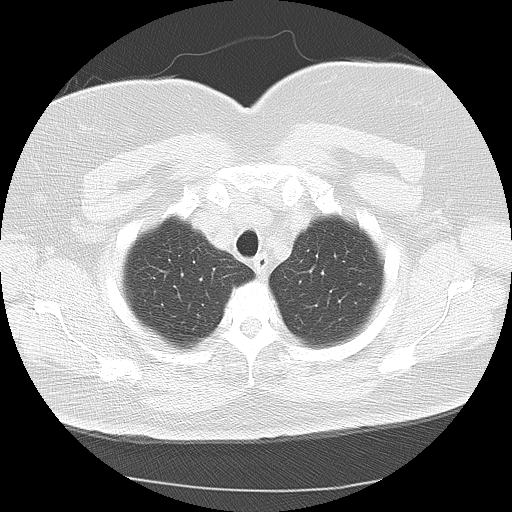

[Series 100: sternum · axial · 0.39mm/px · z∈[-189,-84]mm · 5 of 75 slices shown]
[im 11/75  lung]
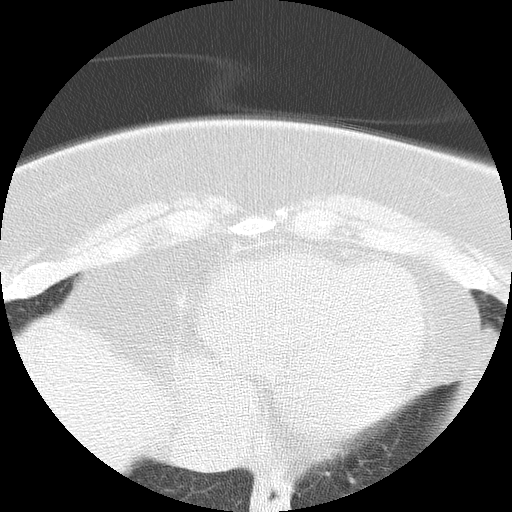
[im 22/75  lung]
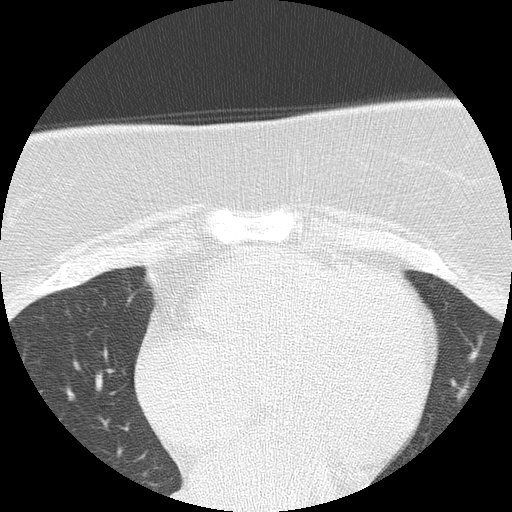
[im 32/75  lung]
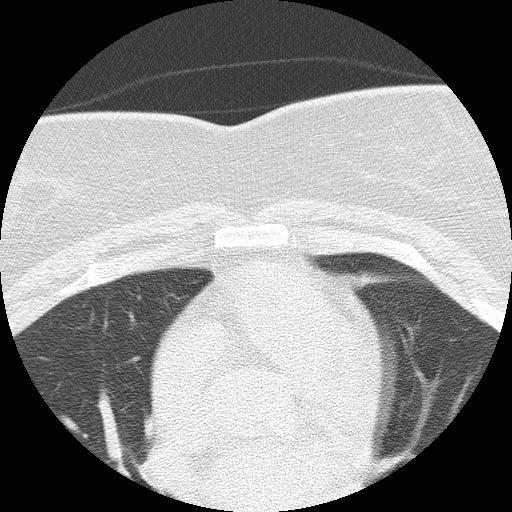
[im 43/75  lung]
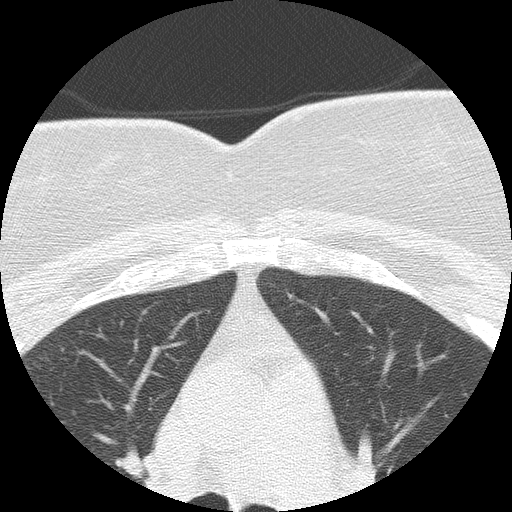
[im 53/75  lung]
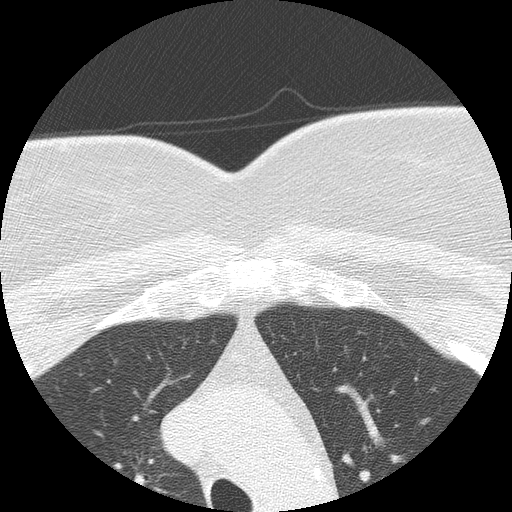

[Series 400: cor · coronal · 0.70mm/px · 3 of 123 slices shown]
[im 31/123  lung]
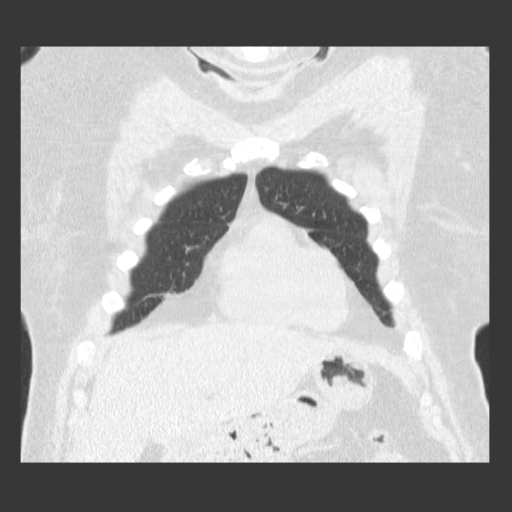
[im 62/123  lung]
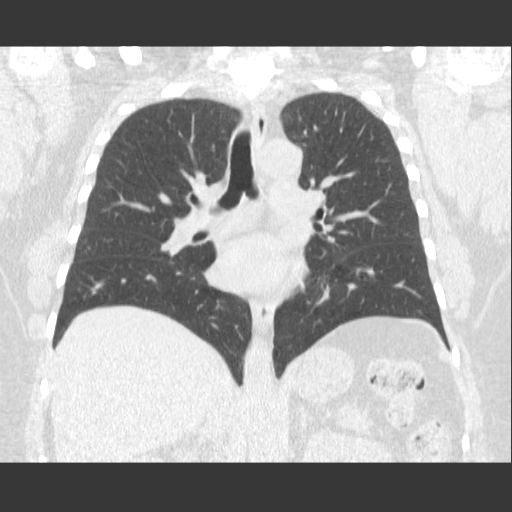
[im 92/123  lung]
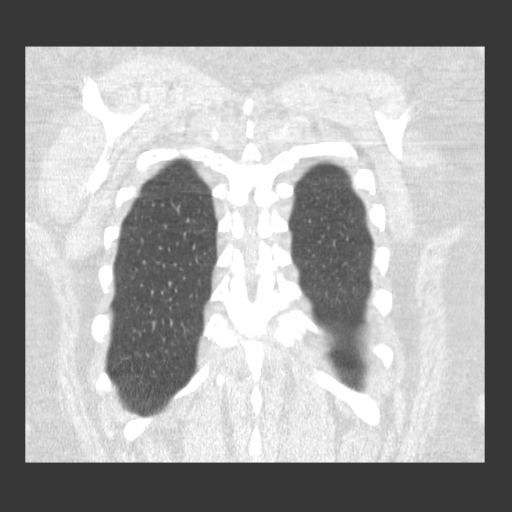

[17 of 36 positions shown; findings below may reference images not displayed]

FINDINGS: There is some degenerative change at the  sternomanubrial
joints.  However no acute bony abnormality is seen and alignment is
normal.  No abnormality of the ribs is noted.  There are
degenerative changes in both shoulders involving the humeral heads
posteriorly.  On the lung window images no lung parenchymal
abnormality is seen.  No effusion is noted.  On soft tissue window
images no mediastinal or hilar adenopathy is seen.  No axillary
adenopathy is noted.
IMPRESSION: 1. There is degenerative change at the sternomanubrial joint but no
acute abnormality is seen and alignment is normal.
2.  Degenerative change in the shoulders.
3.  Otherwise negative CT the chest.

## 2008-06-14 IMAGING — CR DG CHEST 2V
2 series · 2 of 2 positions shown · non-contrast
Comparison: 01/08/2006.

CLINICAL DATA: Cough with congestion for several months.

CHEST - 2 VIEW

[view not recorded (1 of 2)]
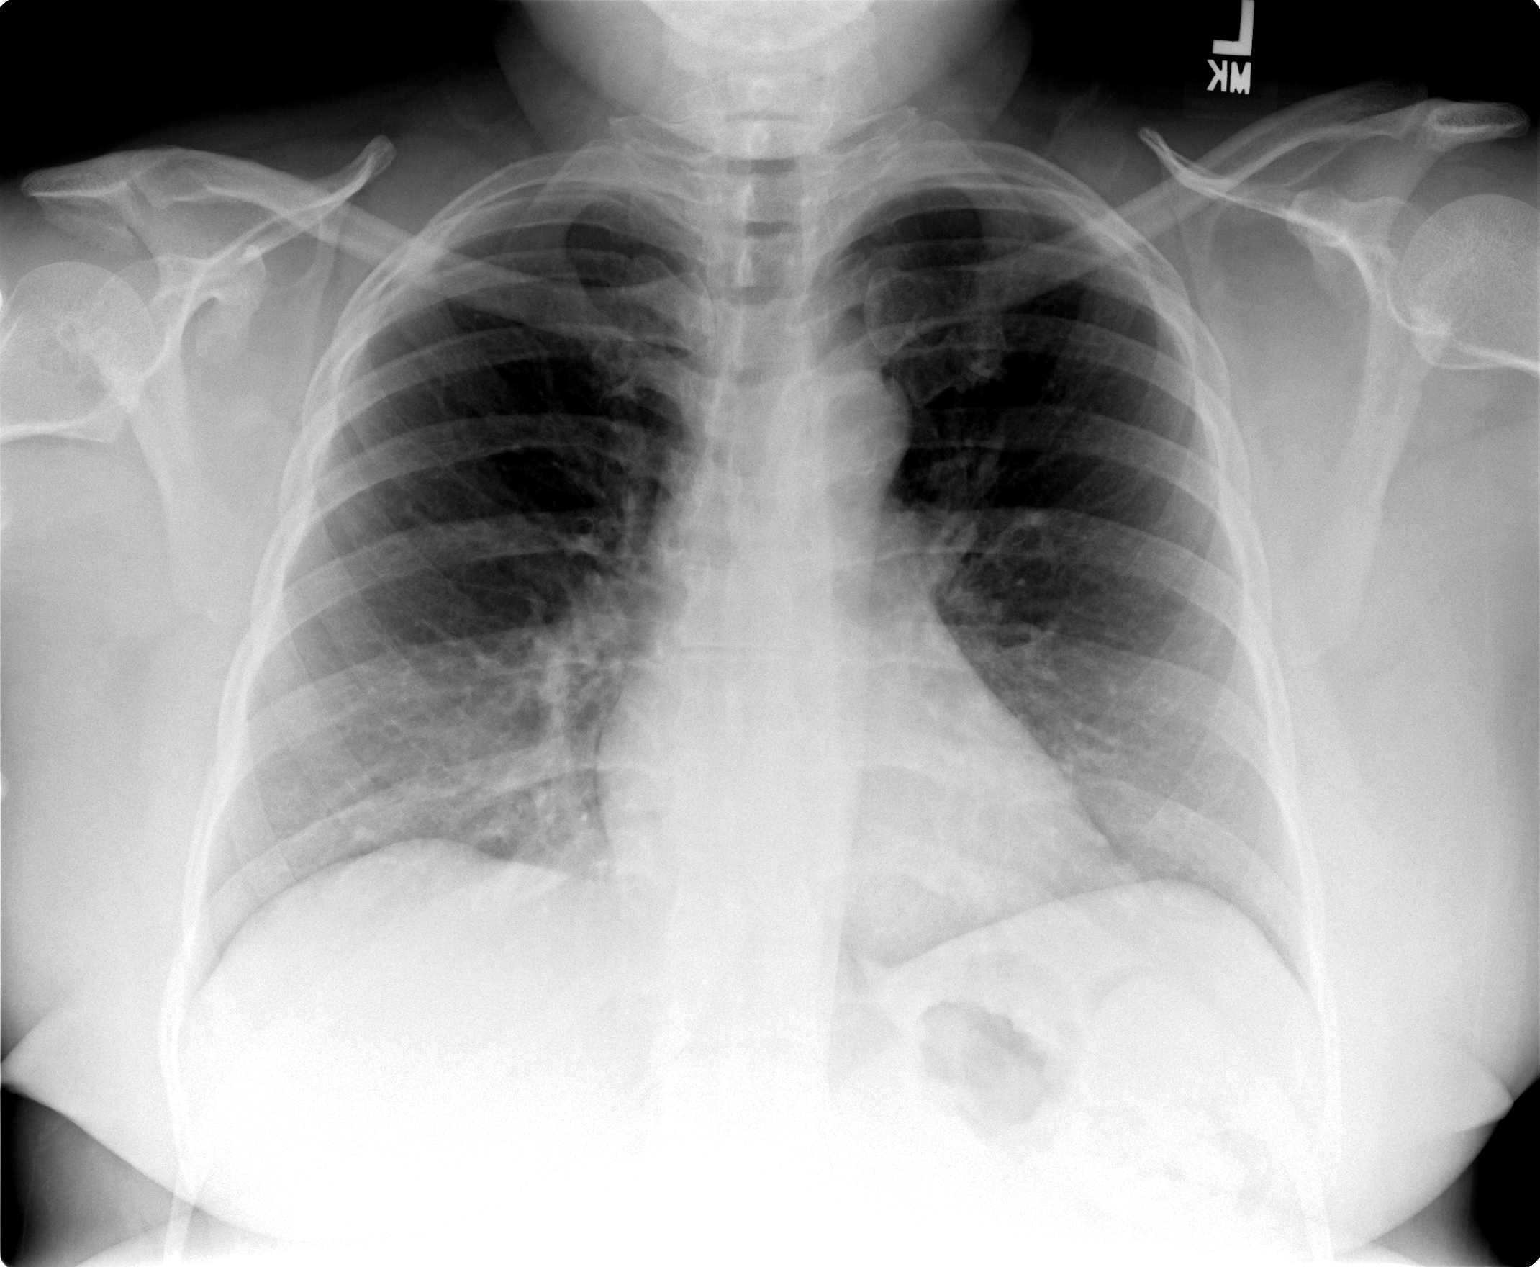

[view not recorded (2 of 2)]
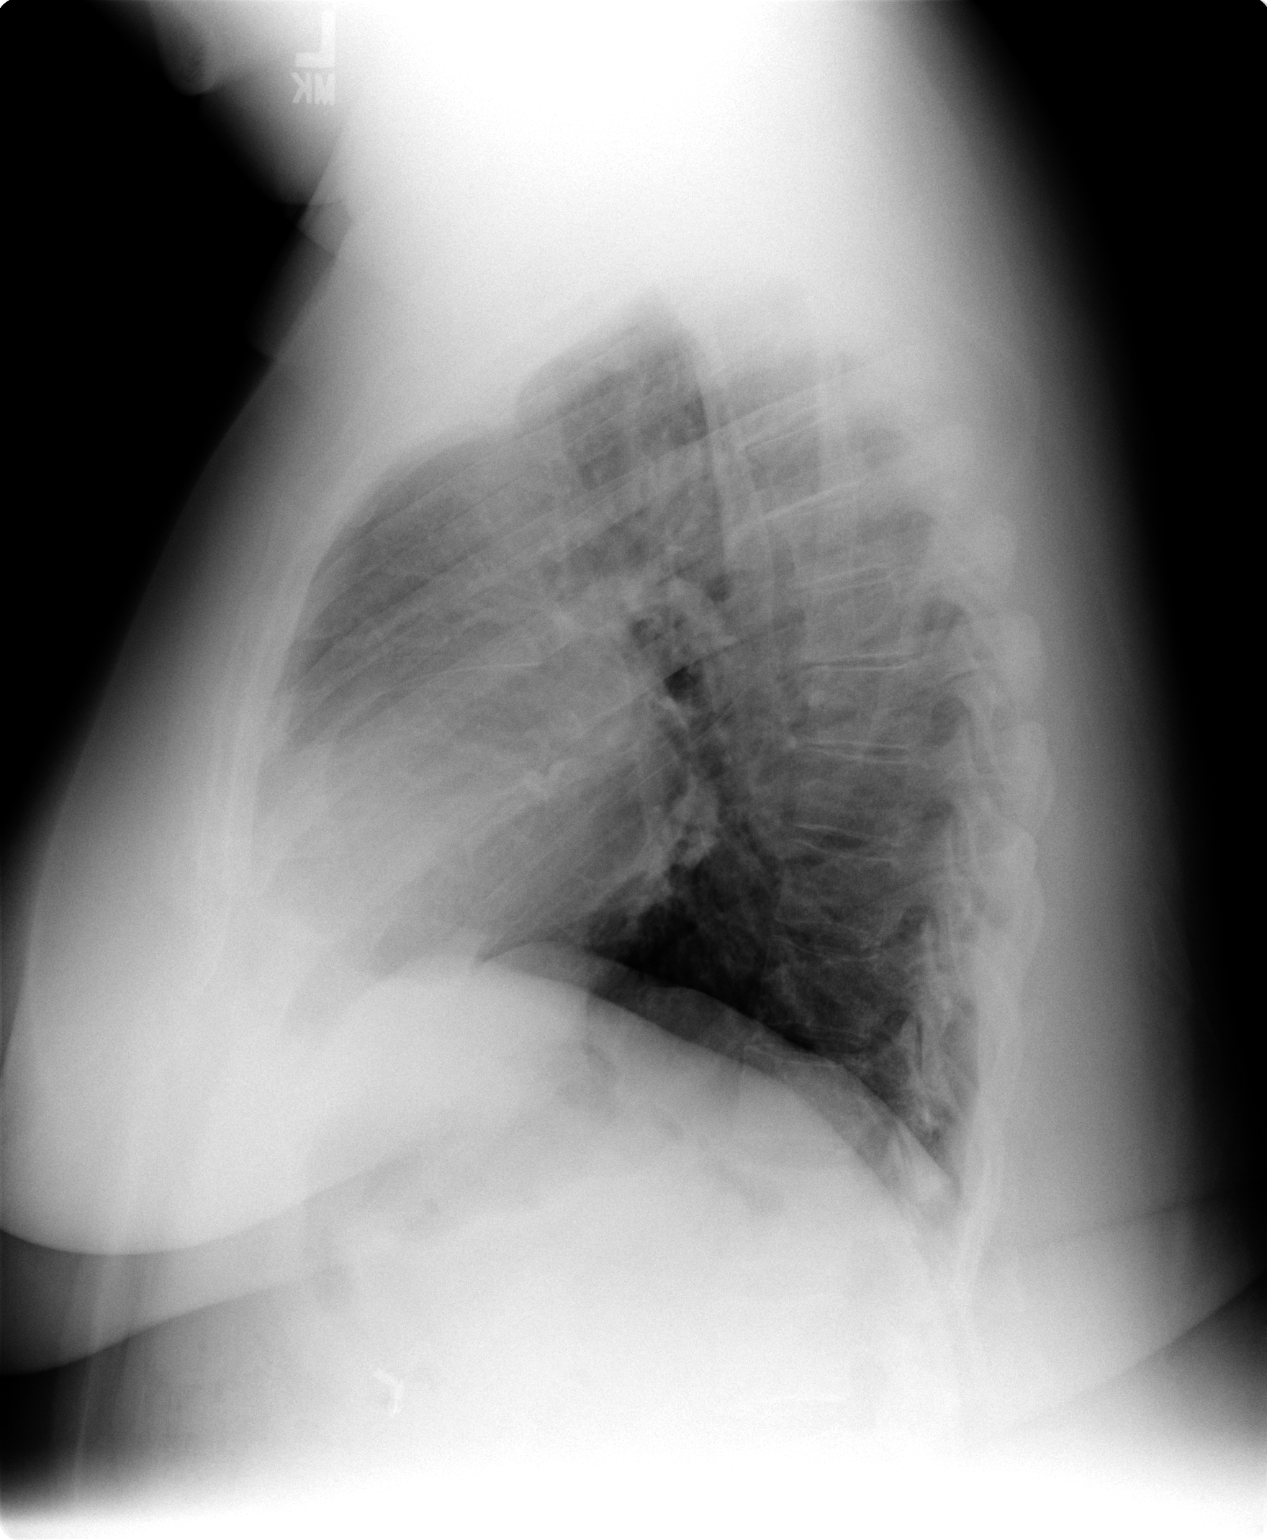

[2 of 2 positions shown; findings below may reference images not displayed]

FINDINGS: The lungs are clear.  The heart and mediastinal
structures are normal.  The osseous structures are unremarkable.
IMPRESSION: No evidence for active chest disease.

## 2008-07-15 ENCOUNTER — Encounter: Admission: RE | Admit: 2008-07-15 | Discharge: 2008-07-15 | Payer: Self-pay | Admitting: Family Medicine

## 2008-09-22 ENCOUNTER — Encounter: Admission: RE | Admit: 2008-09-22 | Discharge: 2008-09-22 | Payer: Self-pay | Admitting: Family Medicine

## 2008-10-18 ENCOUNTER — Encounter: Admission: RE | Admit: 2008-10-18 | Discharge: 2008-10-18 | Payer: Self-pay | Admitting: Family Medicine

## 2009-02-23 IMAGING — CR DG ABDOMEN 1V
1 series · 1 of 1 positions shown · non-contrast
Comparison: MR lumbar spine 03/14/2005

CLINICAL DATA: Right lower quadrant and groin pain.  Constipation.

ABDOMEN - 1 VIEW

[view not recorded]
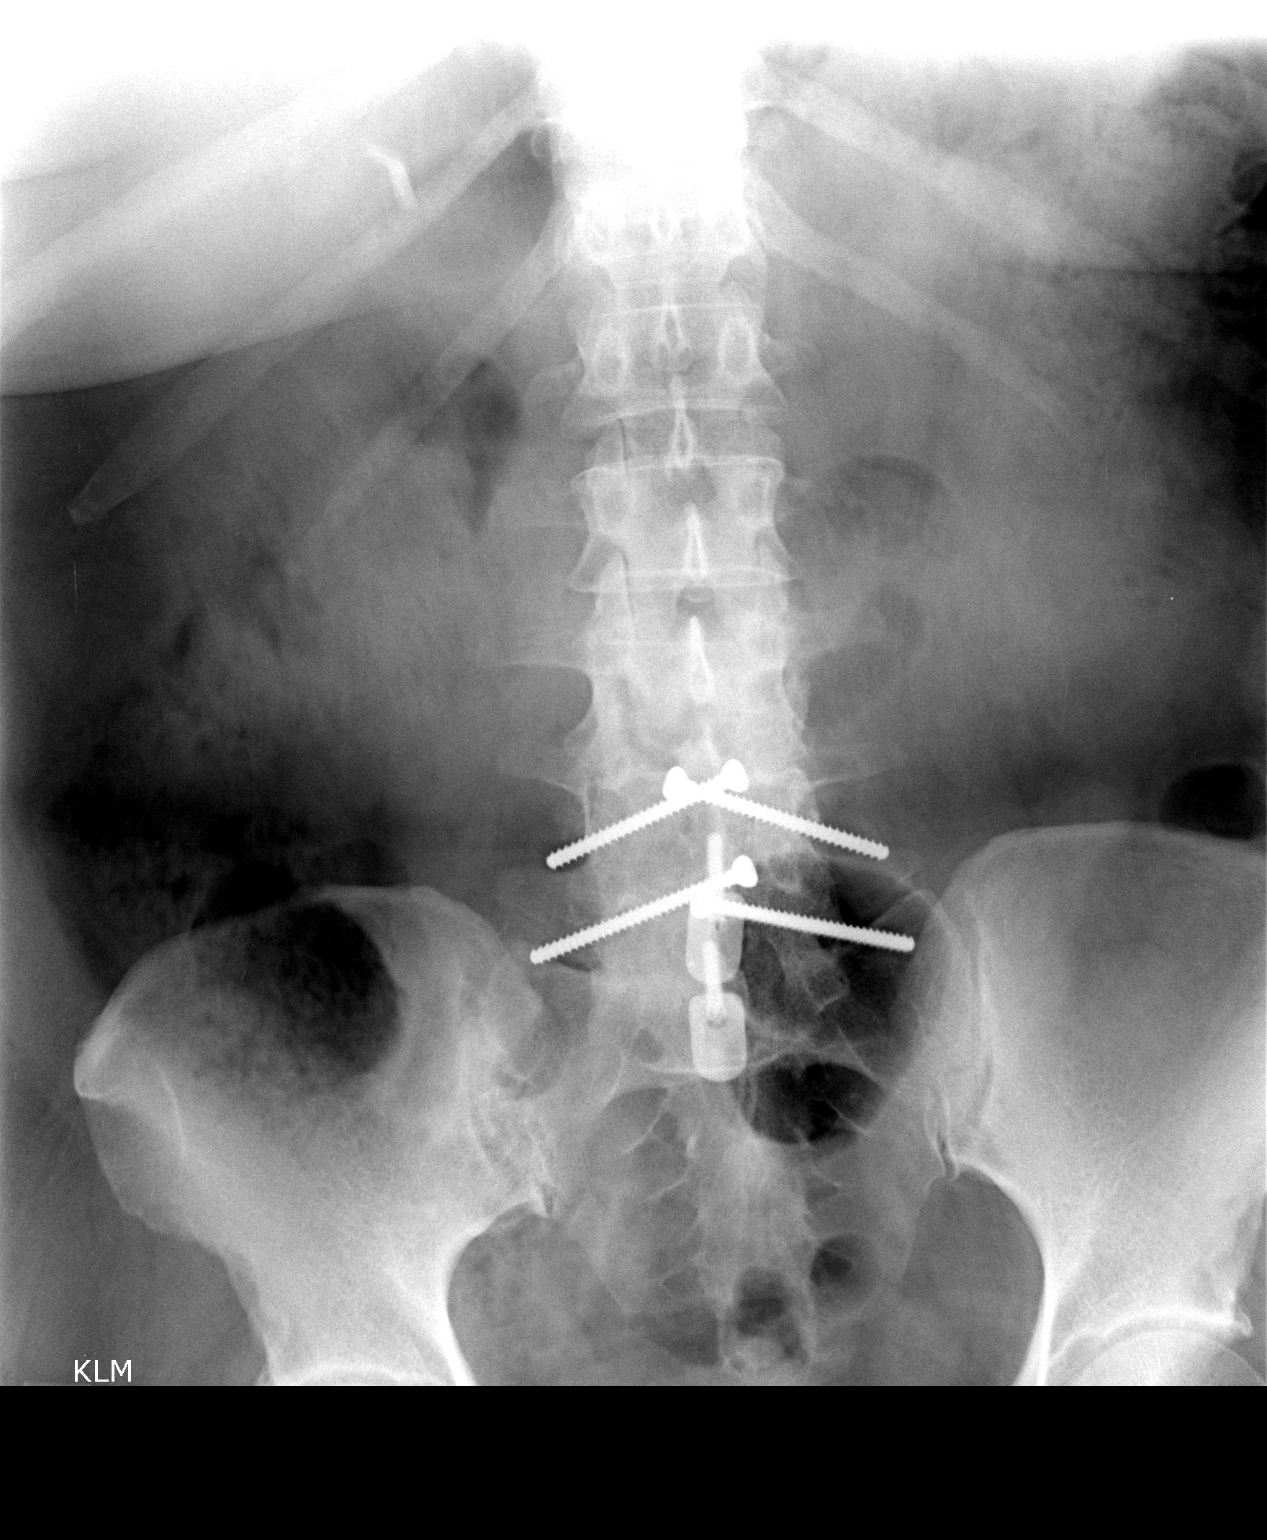

[1 of 1 positions shown; findings below may reference images not displayed]

FINDINGS: Motion artifact mildly degrades image quality.  Stool is
scattered in the colon.  No small bowel dilatation.  Fusion
hardware is seen in the lower lumbar spine.  Degenerative changes
are seen in the hips.
IMPRESSION: No acute findings.

## 2009-03-04 ENCOUNTER — Emergency Department (HOSPITAL_COMMUNITY): Admission: EM | Admit: 2009-03-04 | Discharge: 2009-03-04 | Payer: Self-pay | Admitting: Emergency Medicine

## 2009-07-26 ENCOUNTER — Encounter: Admission: RE | Admit: 2009-07-26 | Discharge: 2009-07-26 | Payer: Self-pay | Admitting: Family Medicine

## 2009-08-16 ENCOUNTER — Encounter: Admission: RE | Admit: 2009-08-16 | Discharge: 2009-08-16 | Payer: Self-pay | Admitting: Orthopedic Surgery

## 2009-09-08 ENCOUNTER — Encounter: Admission: RE | Admit: 2009-09-08 | Discharge: 2009-09-08 | Payer: Self-pay | Admitting: Family Medicine

## 2009-09-24 DIAGNOSIS — Z8614 Personal history of Methicillin resistant Staphylococcus aureus infection: Secondary | ICD-10-CM

## 2009-09-24 HISTORY — DX: Personal history of Methicillin resistant Staphylococcus aureus infection: Z86.14

## 2009-11-10 ENCOUNTER — Encounter: Admission: RE | Admit: 2009-11-10 | Discharge: 2009-12-07 | Payer: Self-pay | Admitting: Rheumatology

## 2010-01-03 ENCOUNTER — Encounter: Admission: RE | Admit: 2010-01-03 | Discharge: 2010-01-03 | Payer: Self-pay | Admitting: Family Medicine

## 2010-01-17 IMAGING — MR MR [PERSON_NAME] UP JT W/O CM*L*
4 of 5 series · 19 of 40 positions shown · non-contrast
Comparison: None

CLINICAL DATA: Left shoulder pain with loss of range of motion.

MRI OF THE LEFT SHOULDER WITHOUT CONTRAST
TECHNIQUE: Multiplanar, multisequence MR imaging of the left
shoulder was performed.  No intravenous contrast was administered.

[Series 7: PD fat-sat · oblique · 4.0mm · 0.29mm/px · 8 of 15 slices shown]
[im 1/15]
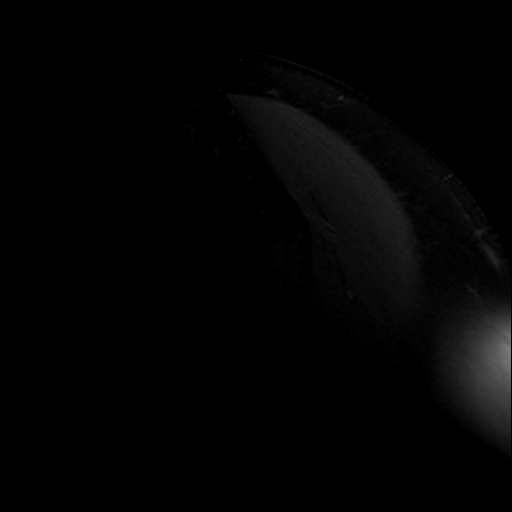
[im 3/15]
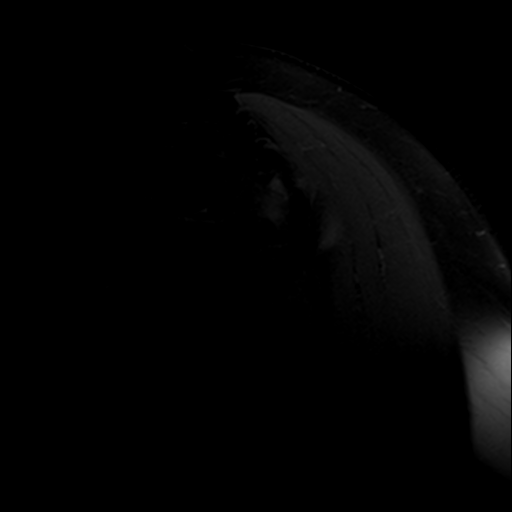
[im 5/15]
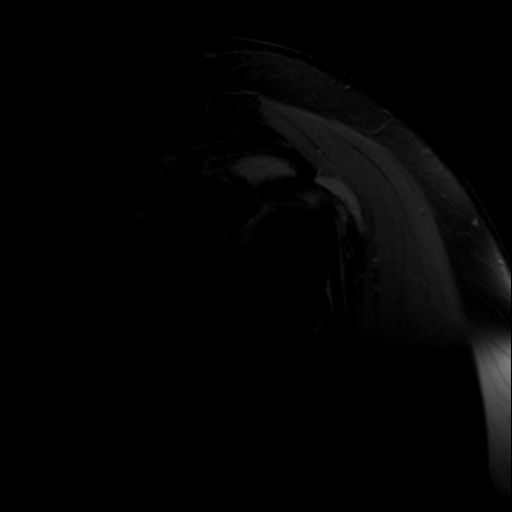
[im 7/15]
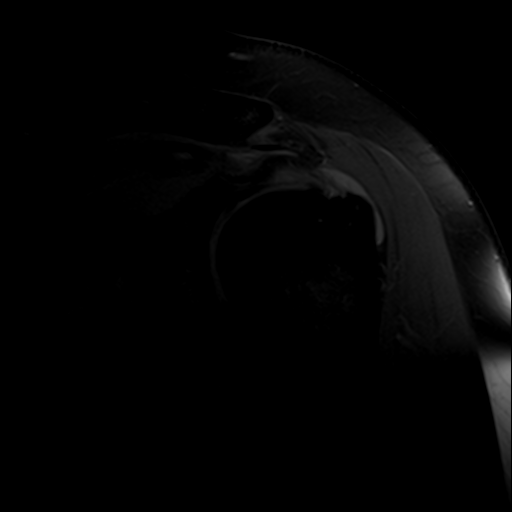
[im 9/15]
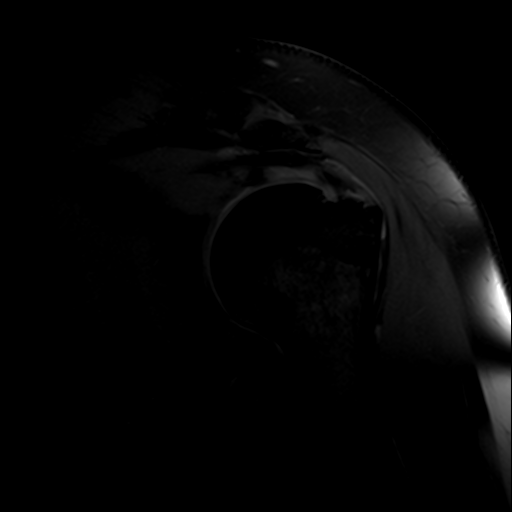
[im 11/15]
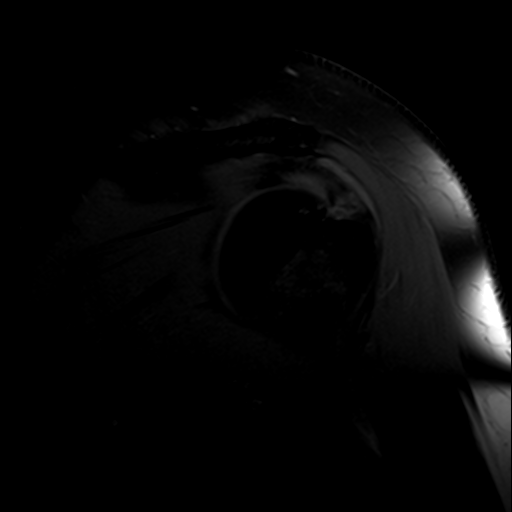
[im 13/15]
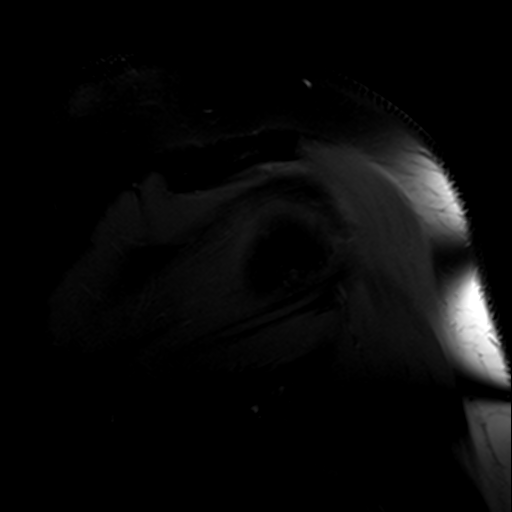
[im 15/15]
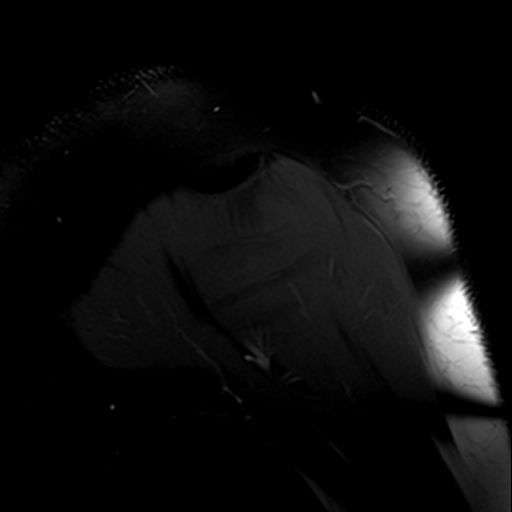

[Series 8: T1 · oblique · 4.0mm · 0.29mm/px · 5 of 15 slices shown]
[im 1/15]
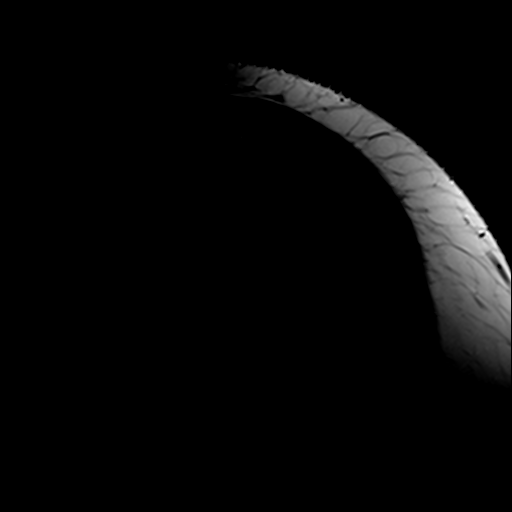
[im 3/15]
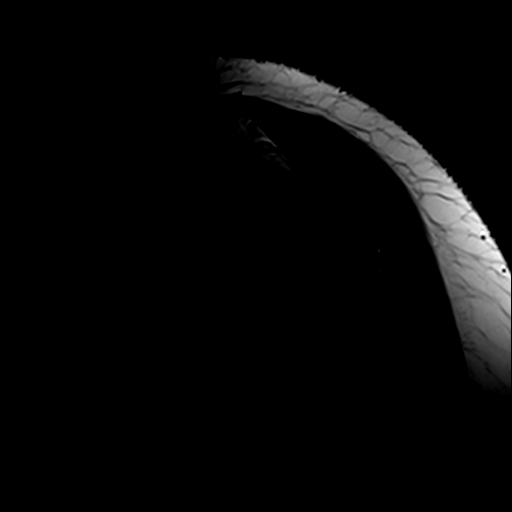
[im 5/15]
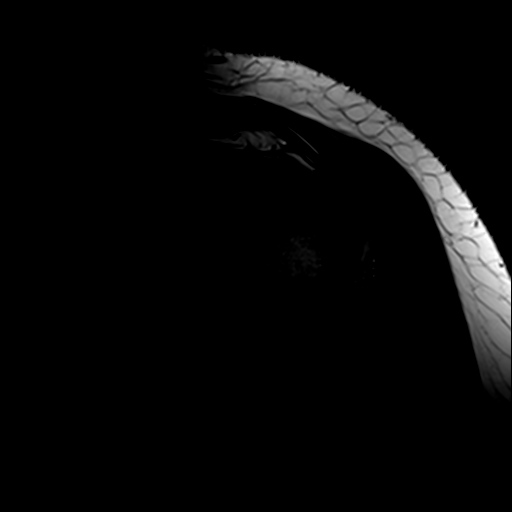
[im 8/15]
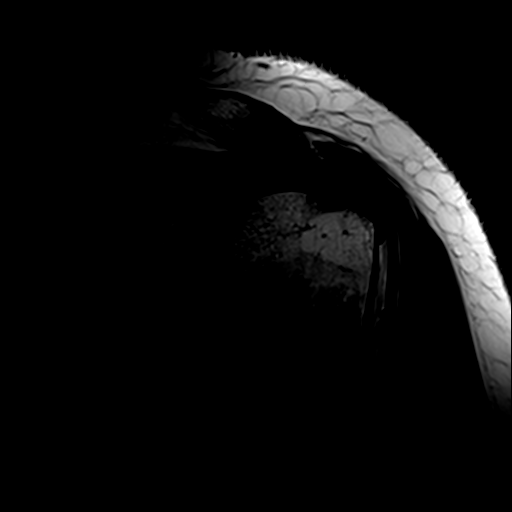
[im 12/15]
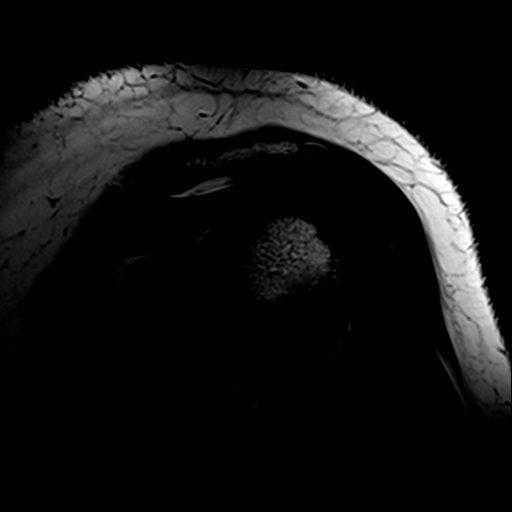

[Series 10: T2 fat-sat · oblique · 4.0mm · 0.29mm/px · 3 of 15 slices shown (1 of 2)]
[im 3/15]
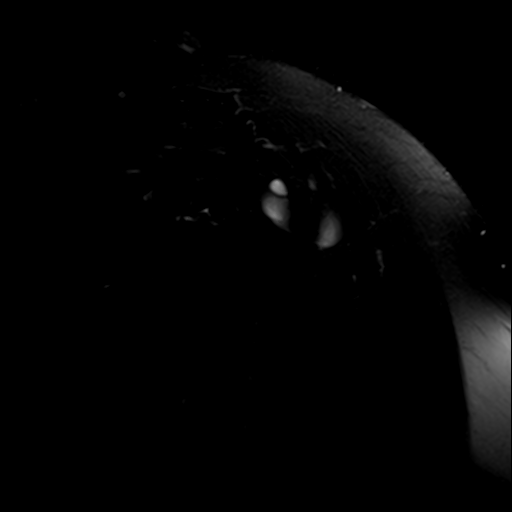
[im 8/15]
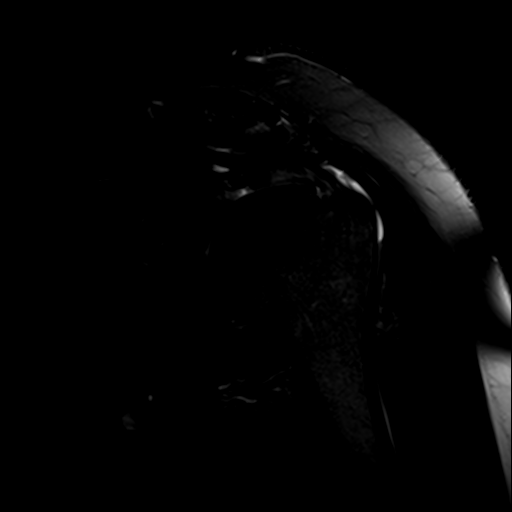
[im 12/15]
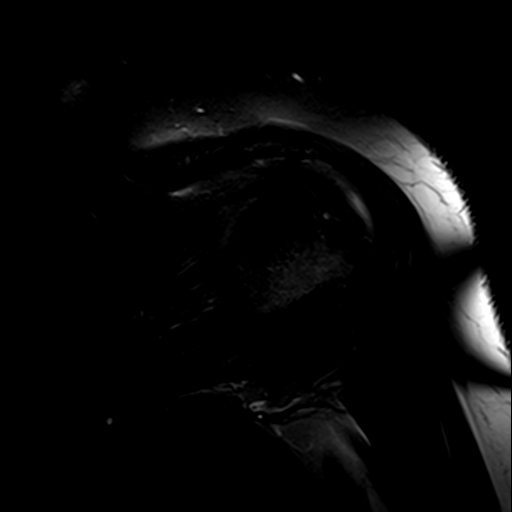

[Series 11: T2 fat-sat · coronal · 4.0mm · 0.29mm/px · 3 of 19 slices shown (2 of 2)]
[im 3/19]
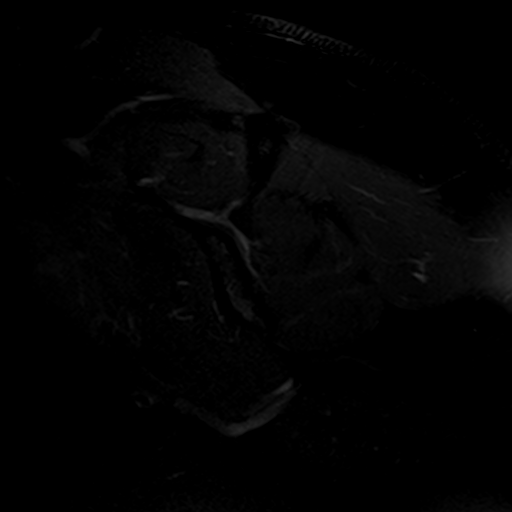
[im 10/19]
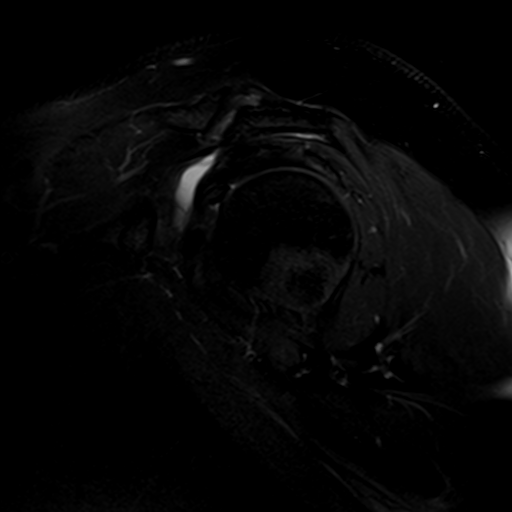
[im 16/19]
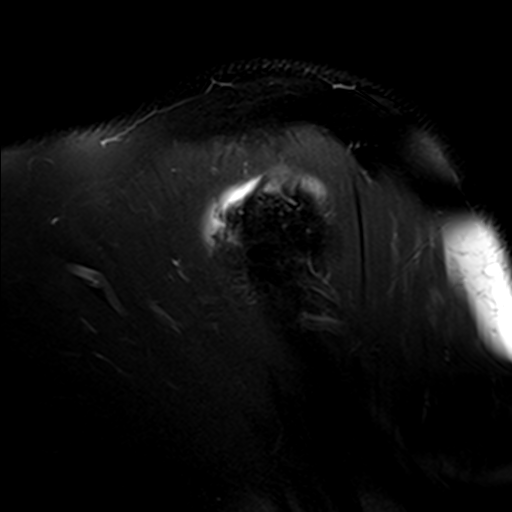

[19 of 40 positions shown; findings below may reference images not displayed]

FINDINGS: There is a small full-thickness partial-width tear of
the anterior leading edge of the supraspinatus, with fluid in the
subacromial subdeltoid bursa.  Moderate supraspinatus tendinopathy
is present along with mild infraspinatus and subscapularis
tendinopathy.

There is increased signal and thickening in the intra-articular
portion of the long head of the biceps, compatible with
tendinopathy or partial tearing.

Mild degenerative acromioclavicular arthropathy is present with
mild lateral spurring of the acromion. The acromial undersurface is
type 2 (curved). There is some longitudinal fissuring and fluid
along the myotendinous junction of the supraspinatus.

No discrete abnormality of the labrum is identified, although the
use of intra-articular contrast can increase sensitivity in
detecting labral tear.
IMPRESSION: 1.  Small full-thickness partial-width tear of the anterior leading
edge of the supraspinatus, with fluid in the subacromial subdeltoid
bursa.
2.  Moderate supraspinatus and mild infraspinatus and subscapularis
tendinopathy.
3.  Tendinopathy or mild partial tearing of the intra-articular
portion the long head of the biceps.
4.  Lateral spurring of the acromion.
5.  Fissuring along the myotendinous junction of the supraspinatus.

## 2010-02-09 IMAGING — US US PELVIS COMPLETE
1 series · 14 of 25 positions shown · non-contrast
Comparison: None.

CLINICAL DATA: Back and right lower quadrant pain. Recurrent UTIs.
Status post hysterectomy and left oophorectomy.

TRANSABDOMINAL AND TRANSVAGINAL ULTRASOUND OF PELVIS
TECHNIQUE: Both transabdominal and transvaginal ultrasound
examinations of the pelvis were performed including evaluation of
the adnexal regions, and pelvic cul-de-sac.

[Series 1: us pelvis complete · 0.41mm/px · 14 of 28 slices shown]
[im 1/28]
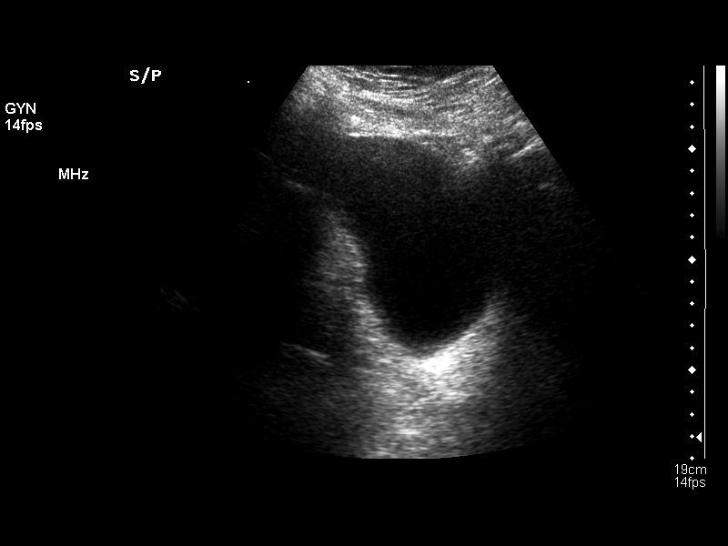
[im 3/28]
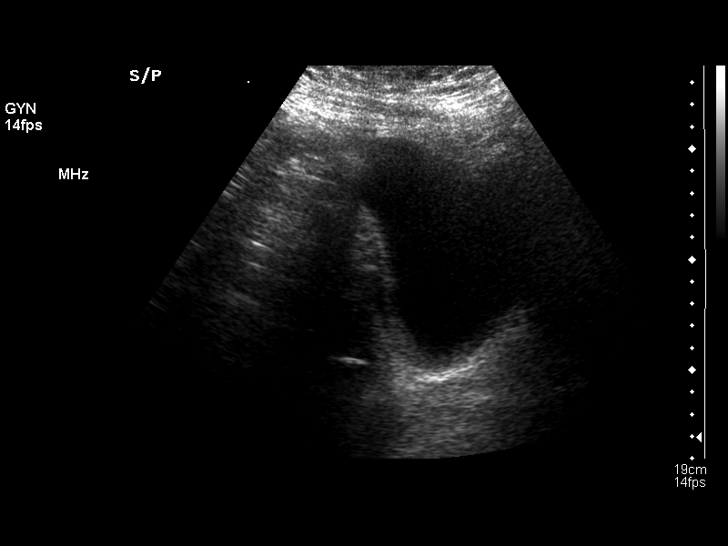
[im 5/28]
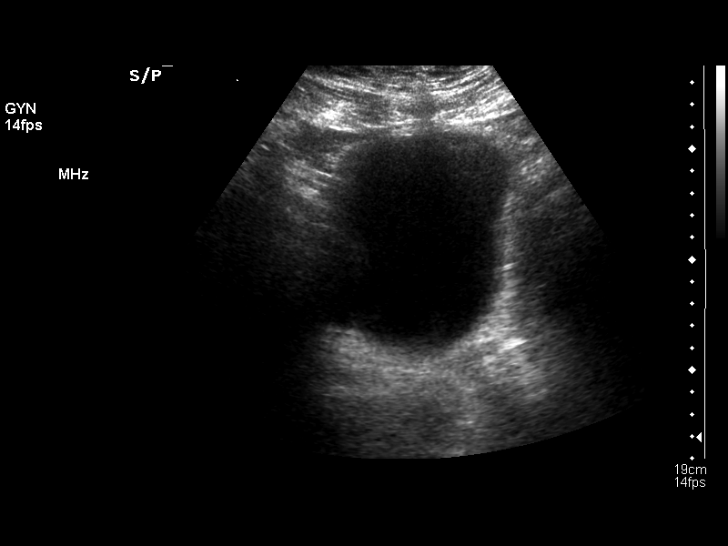
[im 7/28]
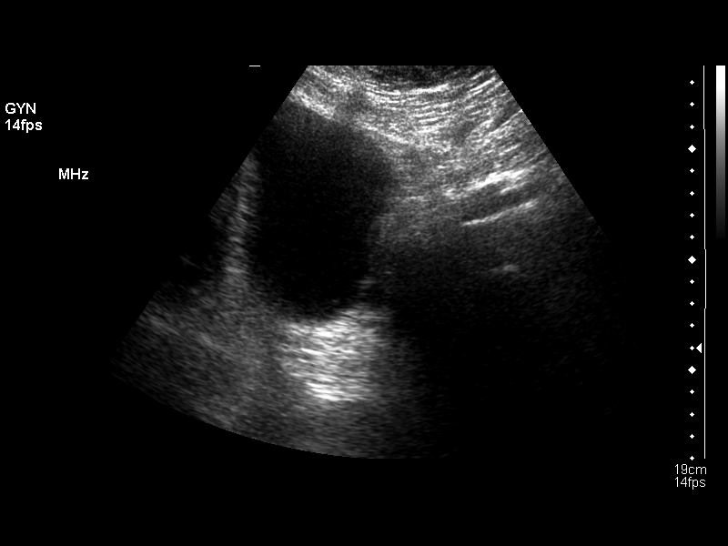
[im 10/28]
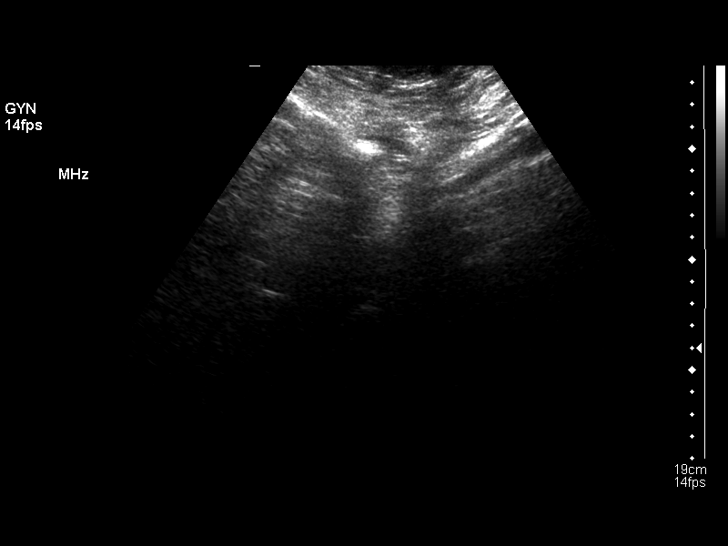
[im 11/28]
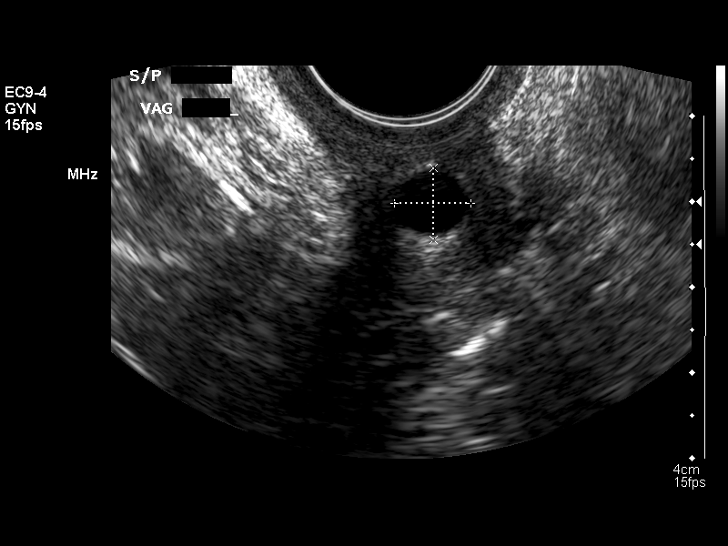
[im 13/28]
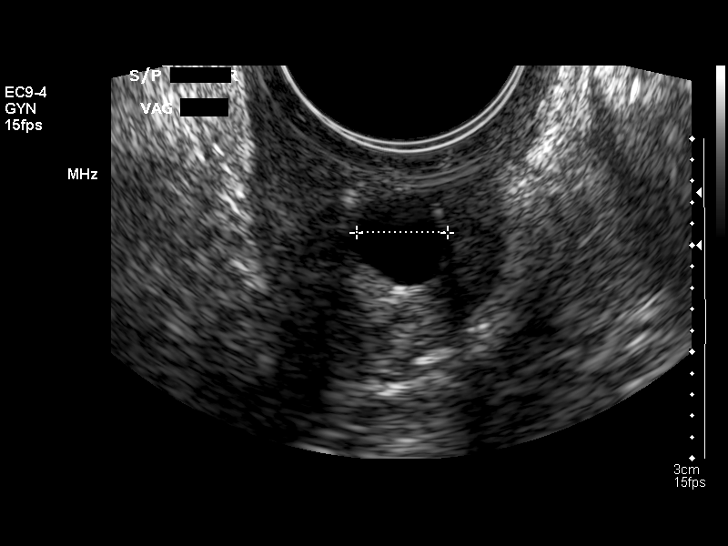
[im 15/28]
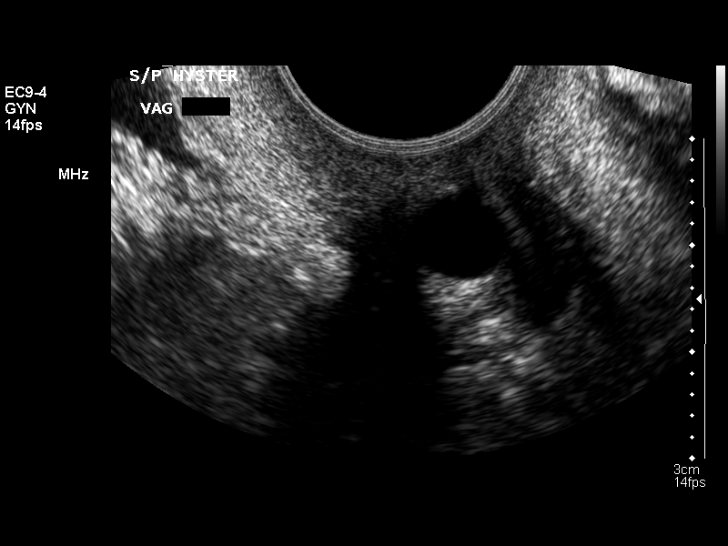
[im 17/28]
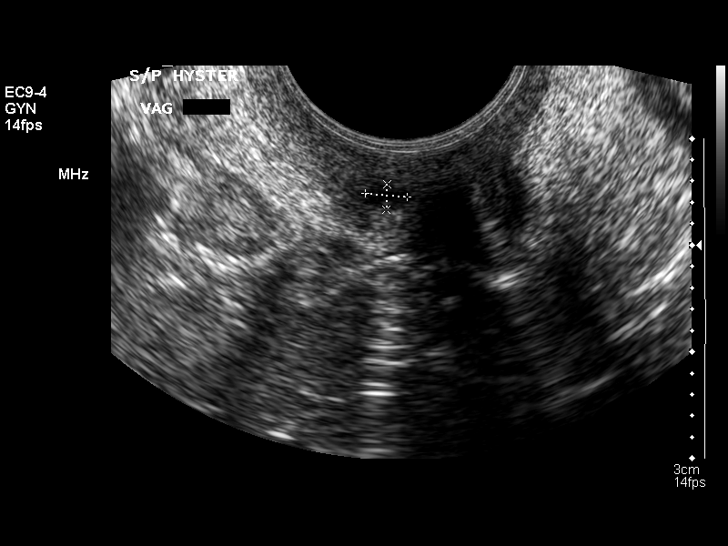
[im 19/28]
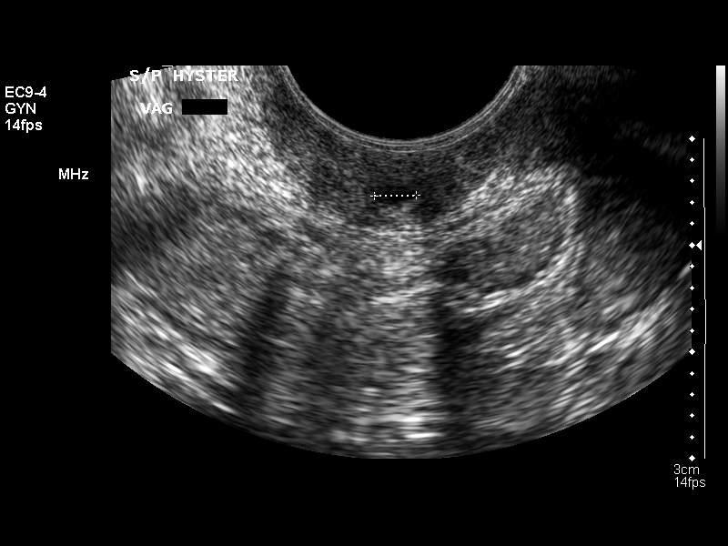
[im 21/28]
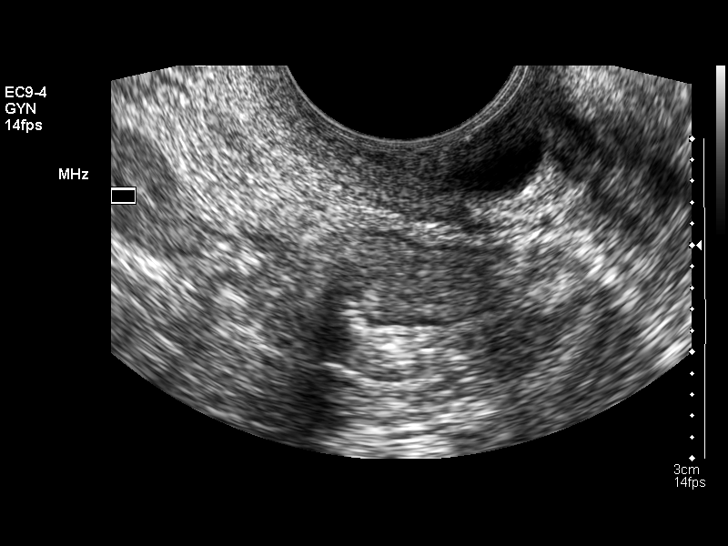
[im 23/28]
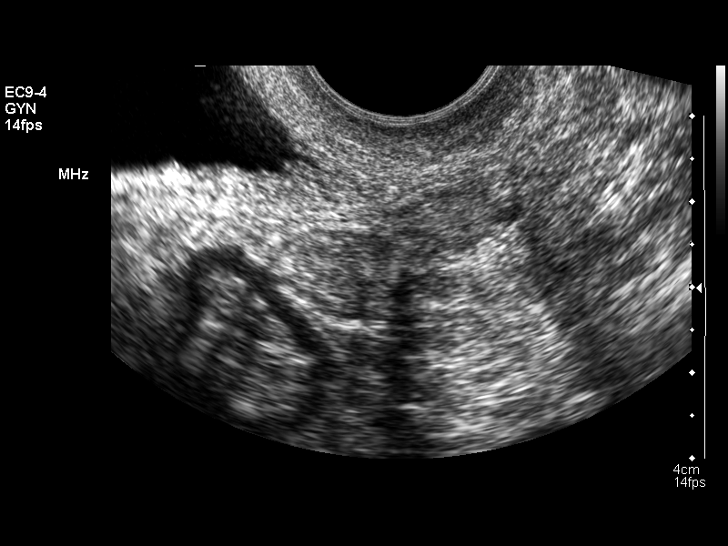
[im 25/28]
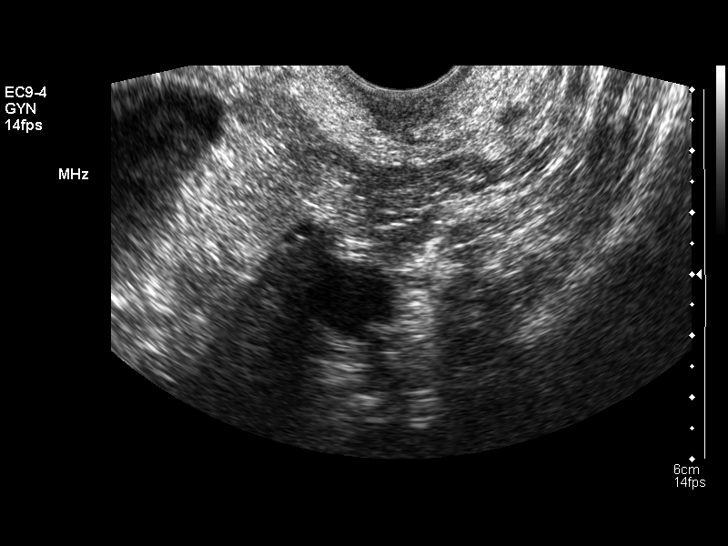
[im 28/28]
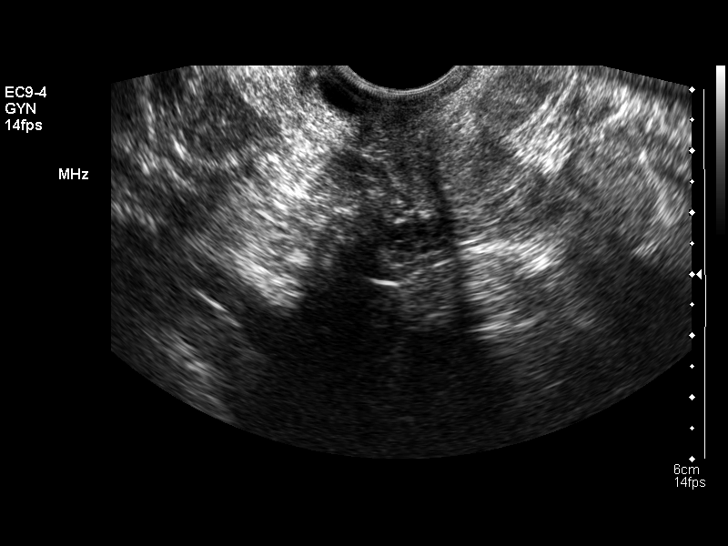

[14 of 25 positions shown; findings below may reference images not displayed]

FINDINGS: Uterus is surgically absent.  A normal vaginal cuff is seen
containing a few Nabothian cysts.

Endometrium not applicable

Right Ovary not visualized either transabdominally or endovaginally

Left Ovary surgically absent

Other Findings:  No pelvic fluid or separate adnexal masses are
noted.
IMPRESSION: Normal vaginal cuff.  Non-visualized right ovary.

## 2010-02-09 IMAGING — US US RENAL
1 series · 14 of 25 positions shown · non-contrast
Comparison: None.

CLINICAL DATA: Right lower quadrant pain, recurrent urinary tract
infections

RENAL/URINARY TRACT ULTRASOUND COMPLETE

[Series 1: us renal · 0.29mm/px · 14 of 31 slices shown]
[im 1/31]
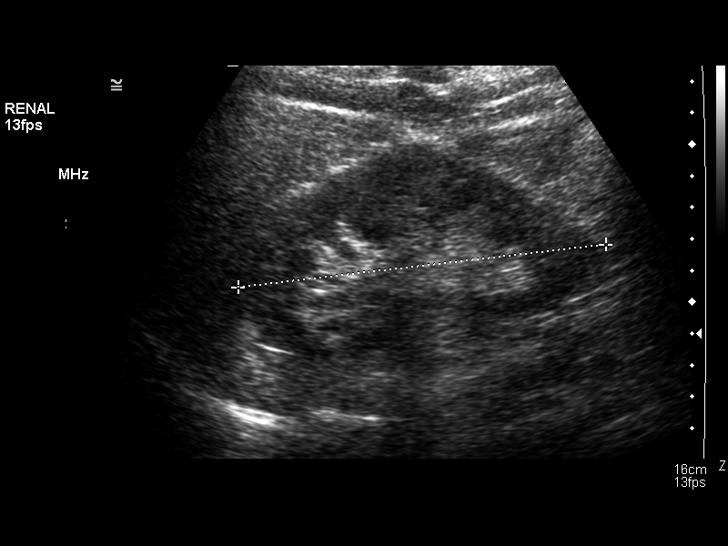
[im 3/31]
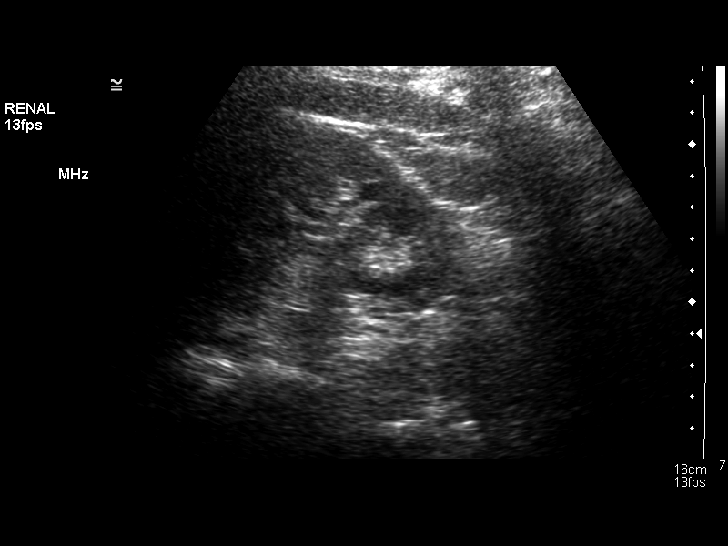
[im 6/31]
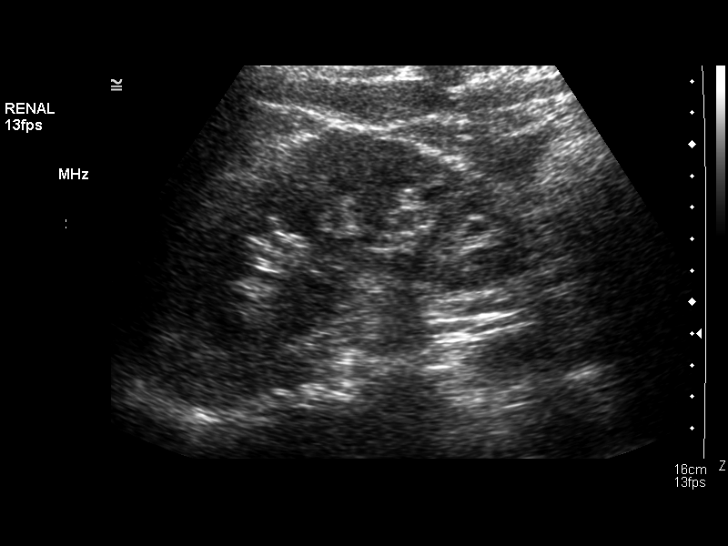
[im 8/31]
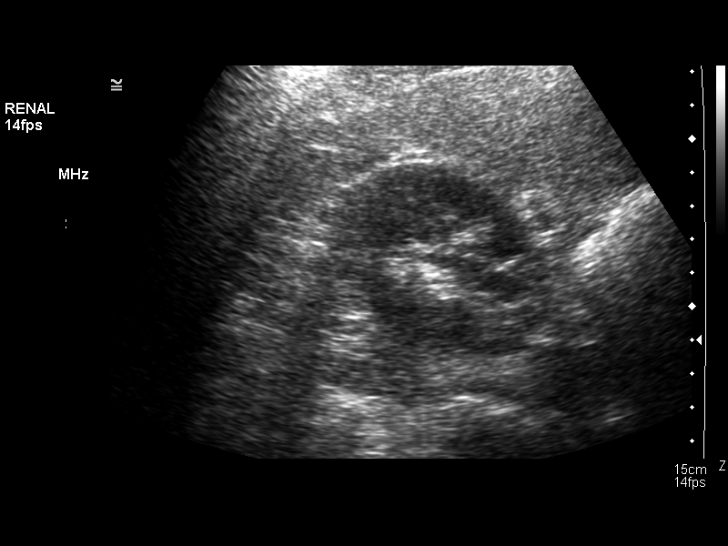
[im 11/31]
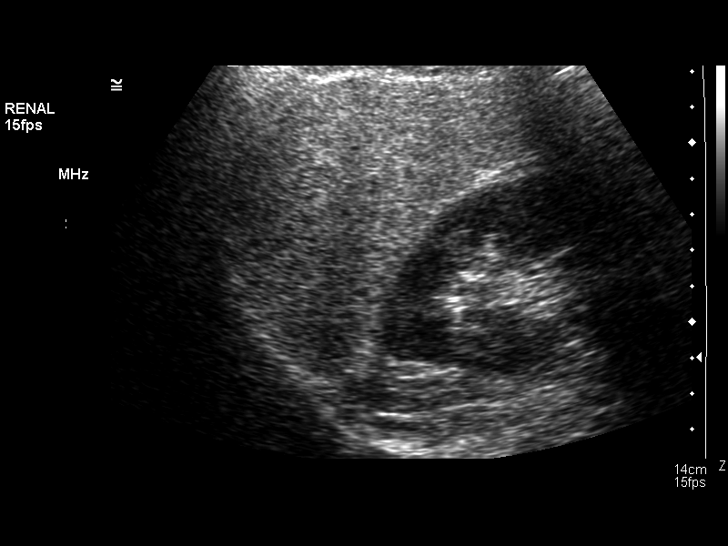
[im 12/31]
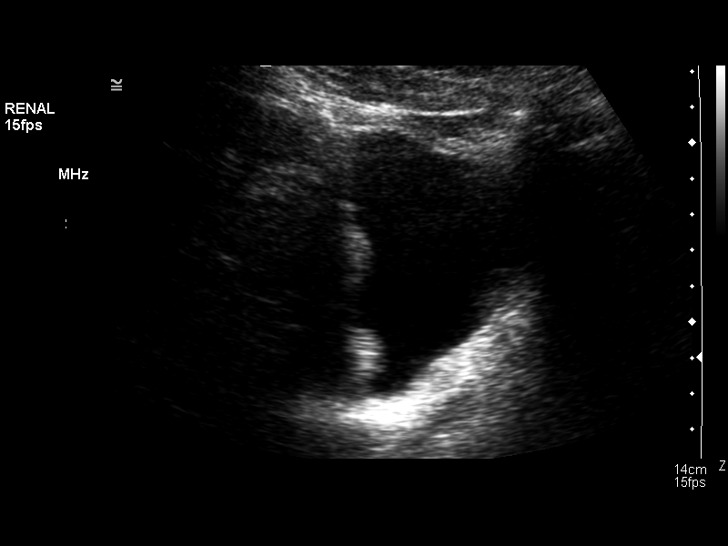
[im 14/31]
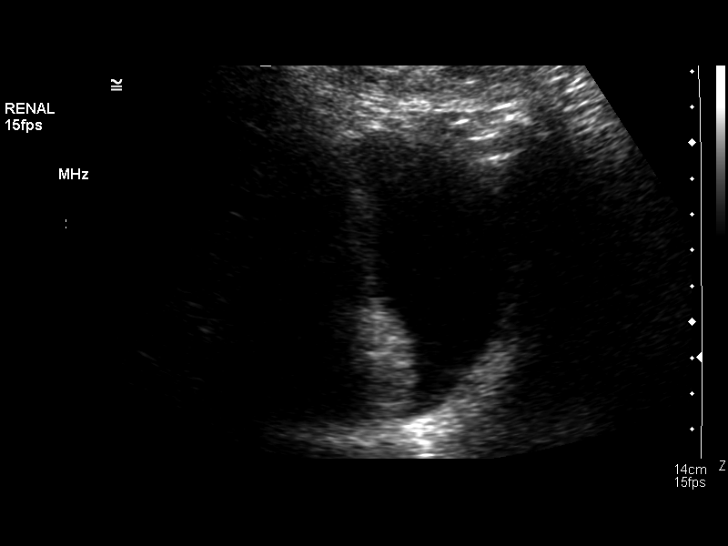
[im 17/31]
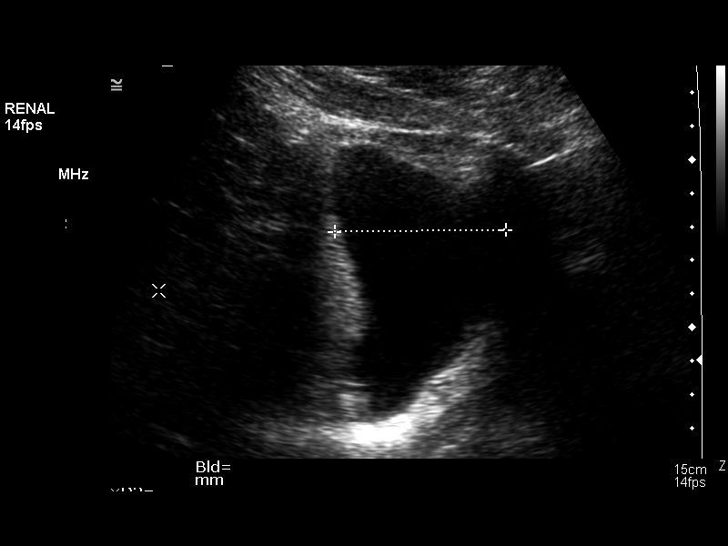
[im 19/31]
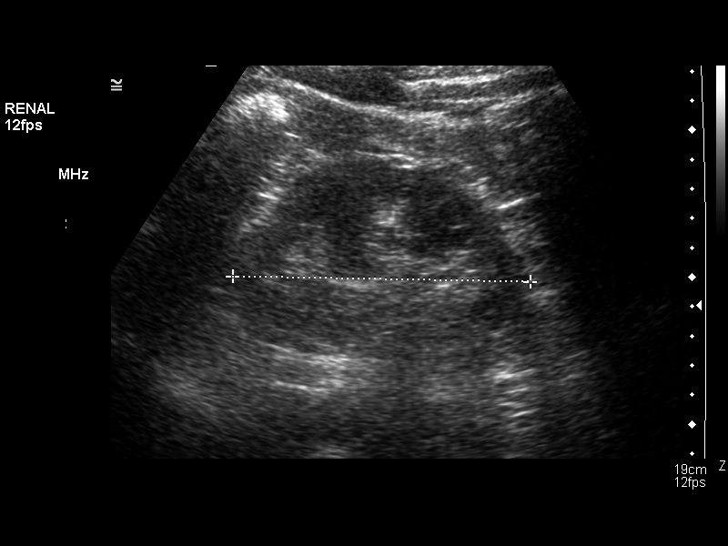
[im 21/31]
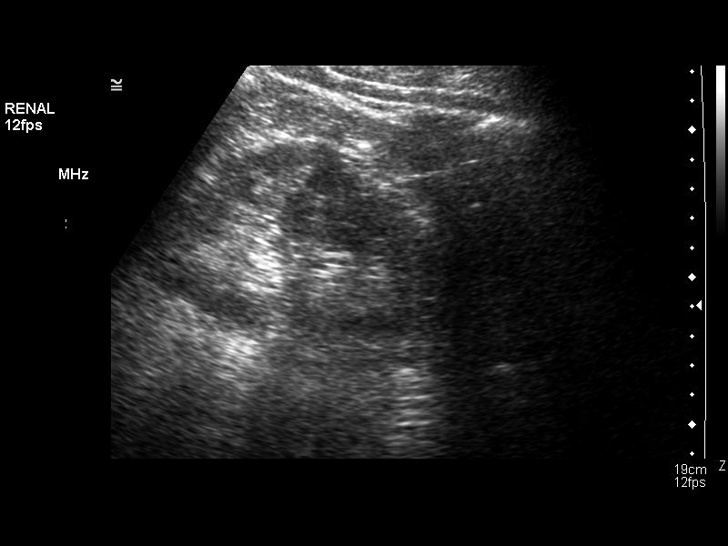
[im 23/31]
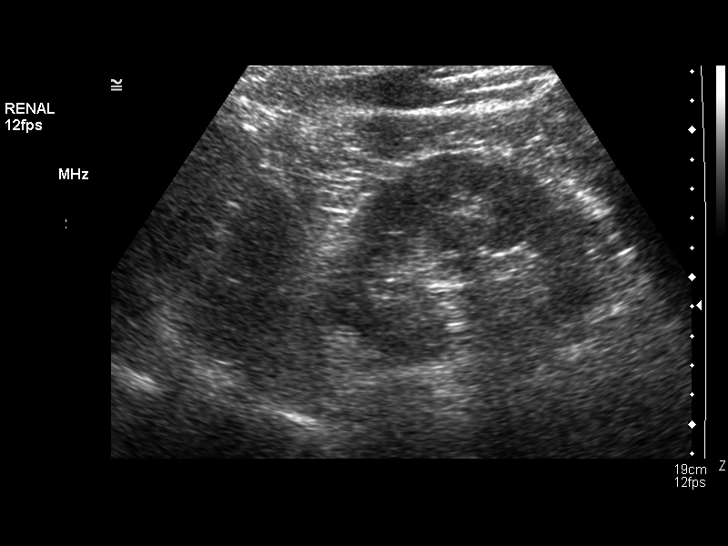
[im 26/31]
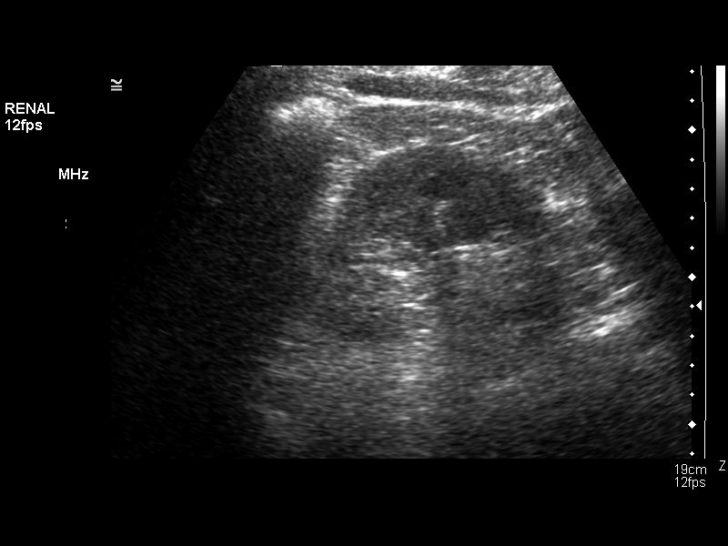
[im 28/31]
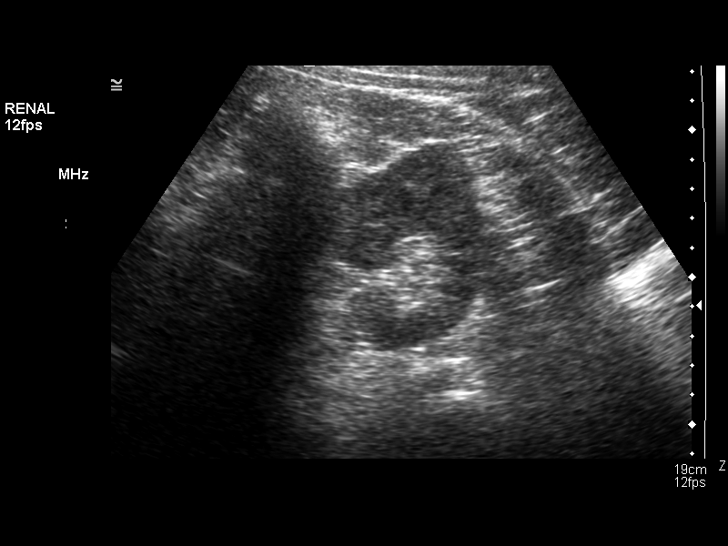
[im 31/31]
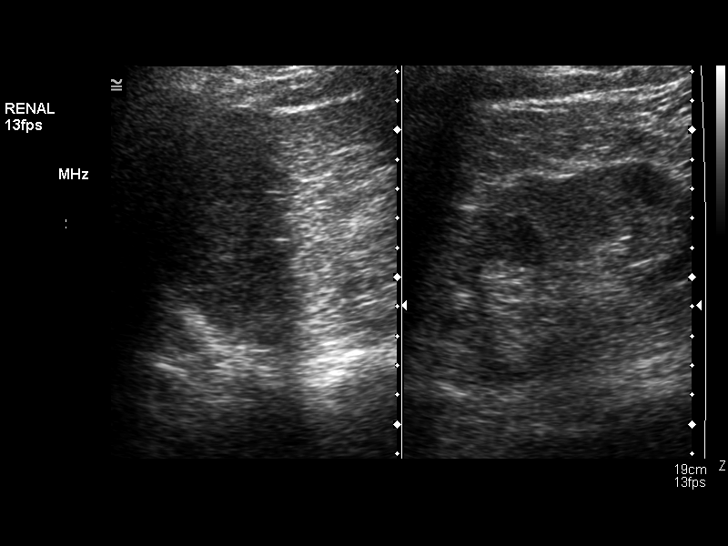

[14 of 25 positions shown; findings below may reference images not displayed]

FINDINGS: Right Kidney:  No hydronephrosis is seen.  The right kidney
measures 11.8 cm sagittally.

Left Kidney:  No hydronephrosis.  The left kidney measures 10.2 cm.

Bladder:  The urinary bladder is unremarkable.  The patient
attempted to void but could not void completely, with residual
urine volume of 210 ml.
IMPRESSION: 1.  No hydronephrosis.  No renal calculi are noted.
2.  The urinary bladder is unremarkable although the patient was
unable to void.

## 2010-06-06 IMAGING — CT CT ABD-PELV W/ CM
3 of 5 series · 13 of 36 positions shown, 19 images · IV contrast (READICAT/WATER & [ID] OMNI 300)
Comparison: February 2005

CLINICAL DATA: The ongoing pelvic pain with right hip and right
back pain for months.  Increased with urination and bowel
movements.  Status post cholecystectomy.

CT ABDOMEN AND PELVIS WITH CONTRAST
TECHNIQUE: Multidetector CT imaging of the abdomen and pelvis was
performed following the standard protocol during bolus
administration of intravenous contrast.
Contrast: 125 ml of Omnipaque 300%

[Series 3: routine abdomen · axial · 0.91mm/px · z∈[-334,-24]mm · 7 of 84 slices shown, 12 images]
[im 11/84  soft-tissue]
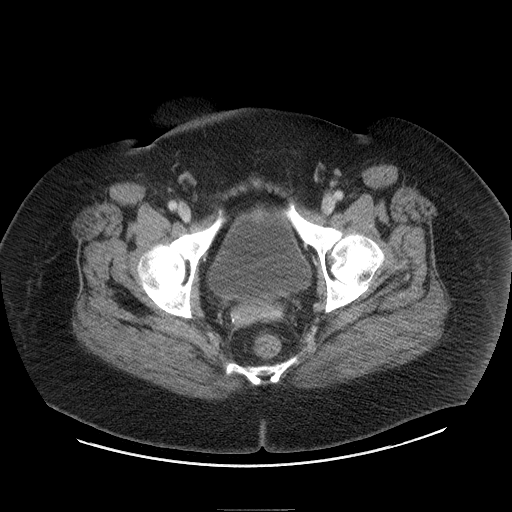
[im 11/84  bone]
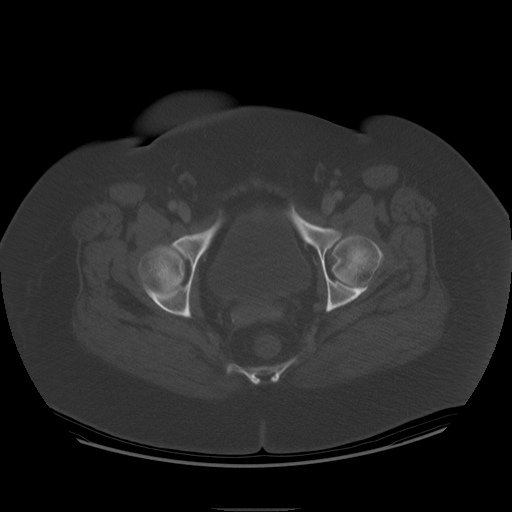
[im 21/84  soft-tissue]
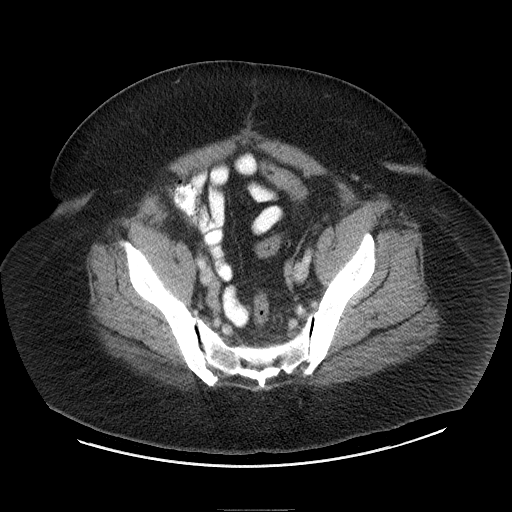
[im 32/84  soft-tissue]
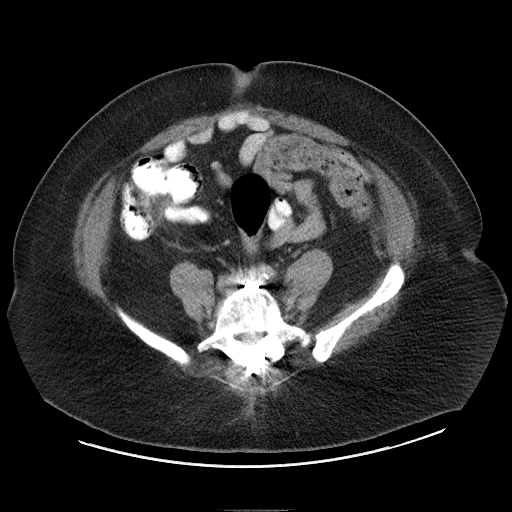
[im 42/84  soft-tissue]
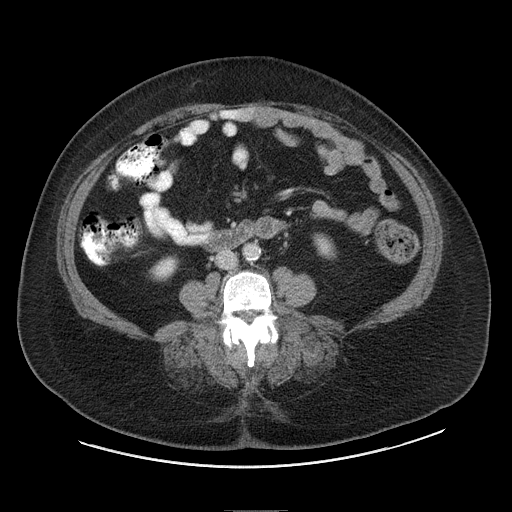
[im 42/84  lung]
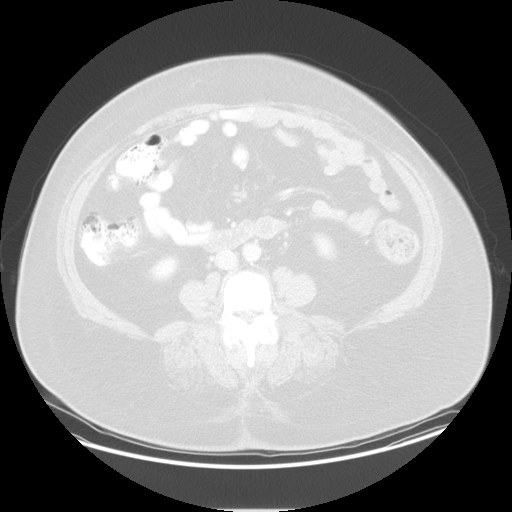
[im 52/84  soft-tissue]
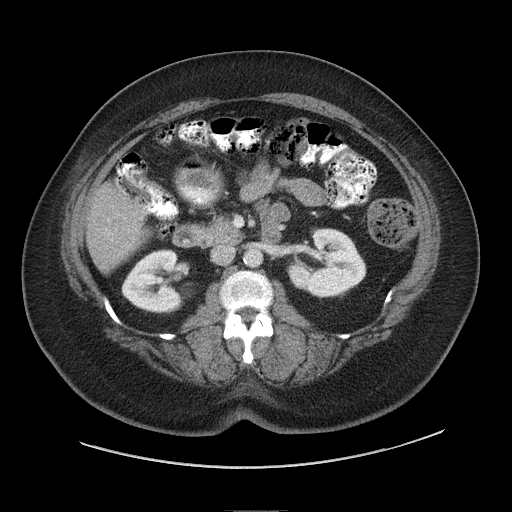
[im 52/84  lung]
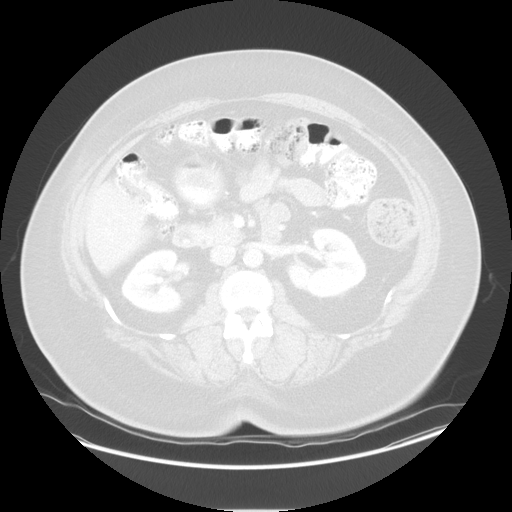
[im 63/84  soft-tissue]
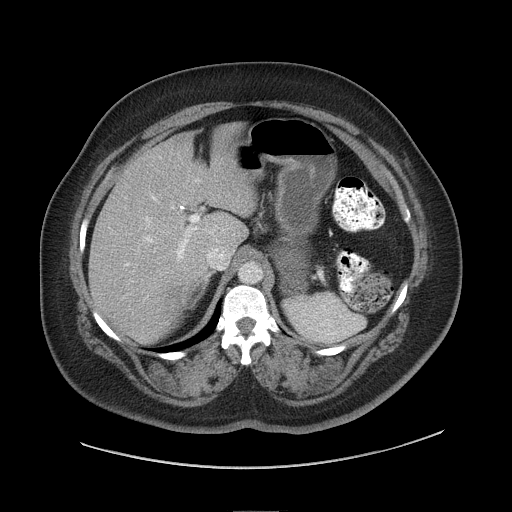
[im 63/84  lung]
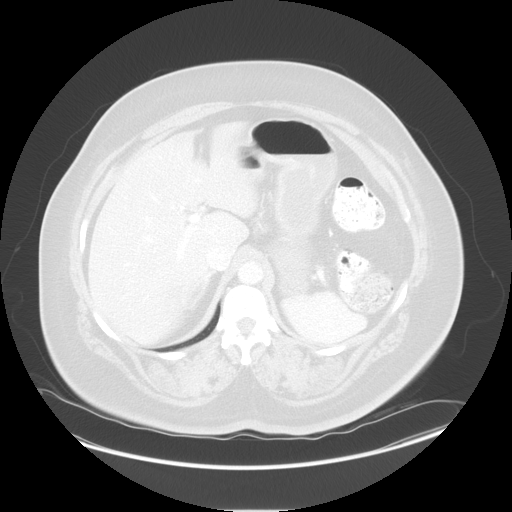
[im 73/84  soft-tissue]
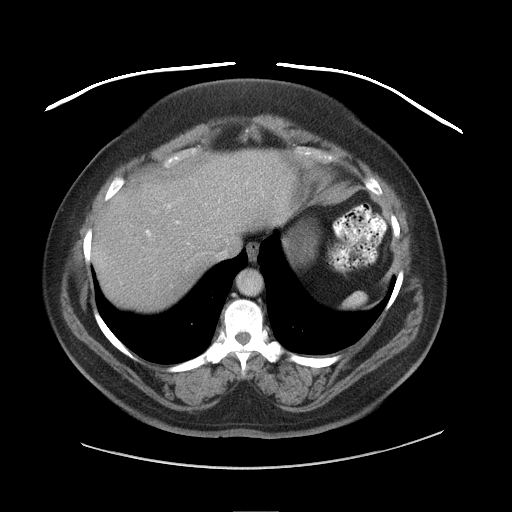
[im 73/84  lung]
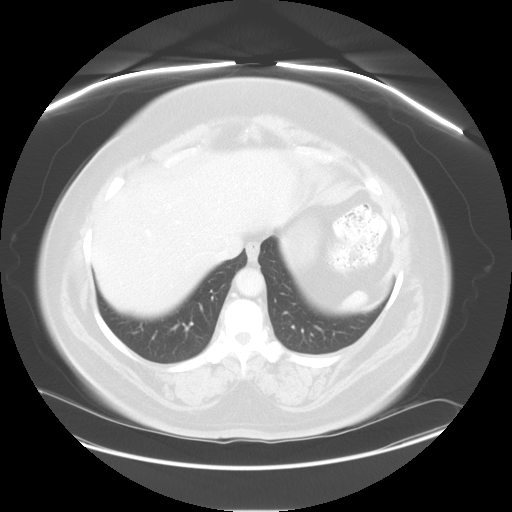

[Series 601: coronal body · coronal · 0.91mm/px · 1 of 150 slices shown, 2 images]
[im 50/150  soft-tissue]
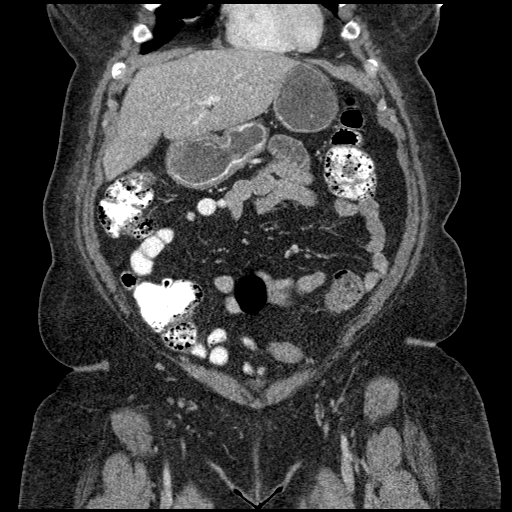
[im 50/150  bone]
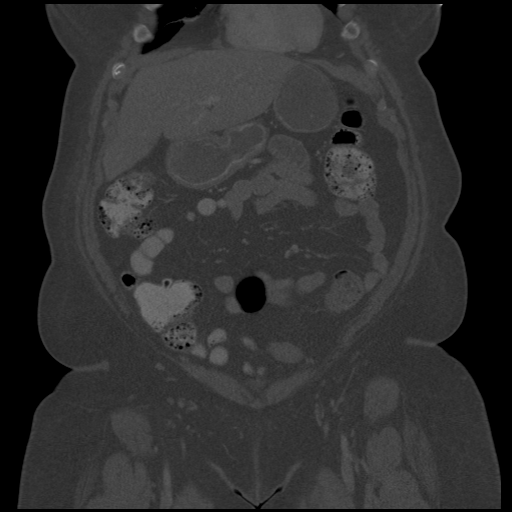

[Series 602: sagittal body · sagittal · 0.91mm/px · 5 of 187 slices shown]
[im 22/187  soft-tissue]
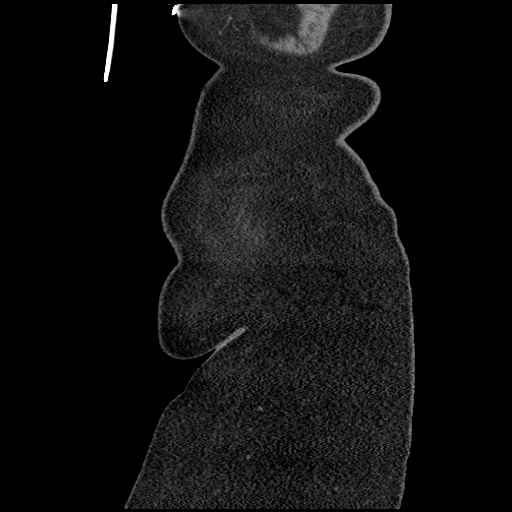
[im 44/187  soft-tissue]
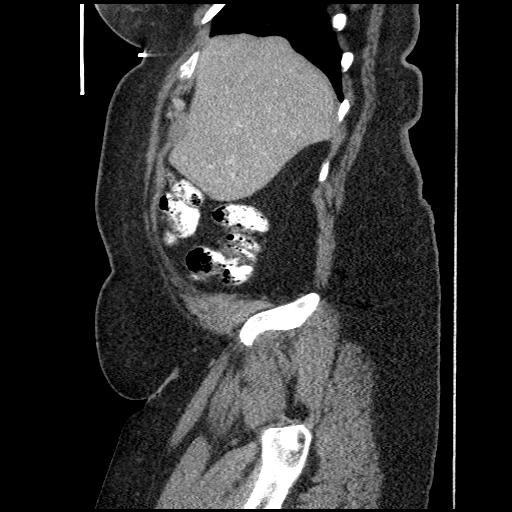
[im 66/187  soft-tissue]
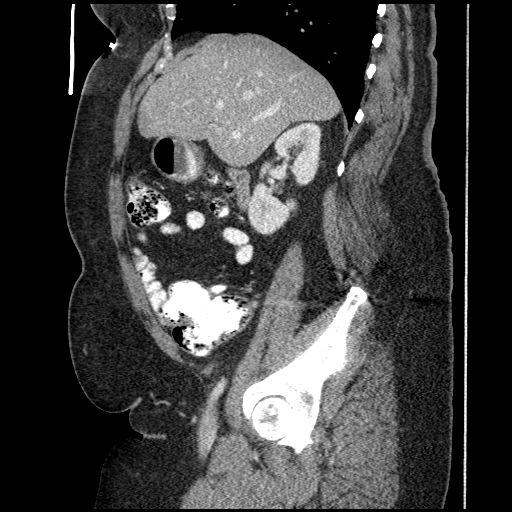
[im 88/187  soft-tissue]
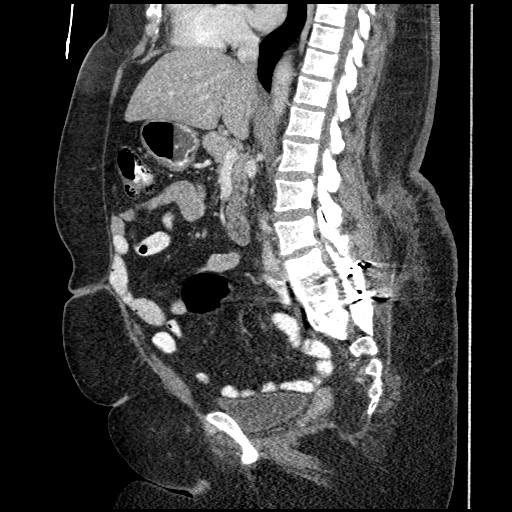
[im 110/187  soft-tissue]
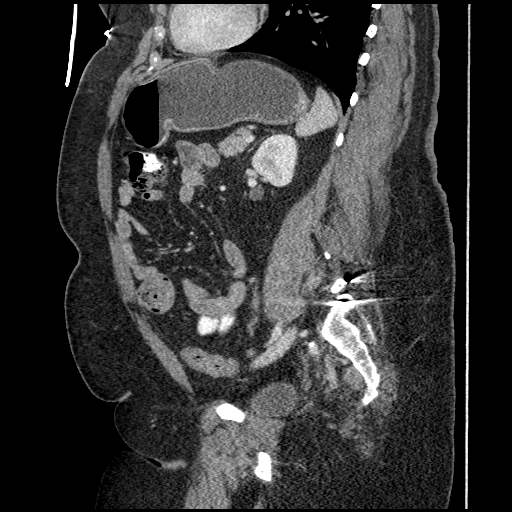

[13 of 36 positions shown; findings below may reference images not displayed]

FINDINGS: The lung bases demonstrate some stable thickening of the
lateral aspect of the major fissure on the left.  The lung bases
are otherwise clear

The liver, spleen, kidneys and pancreas appear unremarkable.  The
gallbladder is surgically absent.There is a stable left adrenal
nodule seen.  Lack of change would correlate with this representing
a benign process and highly likely to represent an adenoma.  The
right adrenal is unremarkable.  There is a moderate amount of stool
present throughout the colon.  No focal bowel abnormalities are
noted.  No intra-abdominal or intrapelvic fluid is seen and no
definite abnormal pelvic, retroperitoneal inguinal or abdominal
adenopathy is noted.

A normal posthysterectomy appearance to the pelvis is seen and the
bladder appears unremarkable.  Soft tissue planes appear maintained
with no focal herniations apparent.

Some artifact is present from postoperative screw rod fixation
devices in the lower lumbar spine.  Postsurgical changes in the
lower lumbar spine are present and bony structures are otherwise
unremarkable.  A moderate amount of calcification is present in the
intra-abdominal portion of the aorta and iliac vessels.
IMPRESSION: Stable left adrenal nodule correlating with a benign process,
likely an adenoma.  Otherwise unremarkable abdominal pelvic CT.

## 2010-07-27 ENCOUNTER — Encounter: Admission: RE | Admit: 2010-07-27 | Discharge: 2010-07-27 | Payer: Self-pay | Admitting: Family Medicine

## 2010-10-15 ENCOUNTER — Encounter: Payer: Self-pay | Admitting: Family Medicine

## 2010-10-24 NOTE — Assessment & Plan Note (Signed)
Summary: scheduling error.   Chief Complaint:  Follow up. Pt states she is not using a cpap.  Pt states she is unable to sleep at night- fatigued during the day.  Occ. takes nap during the day. Pt c/o constant pain in center of chest rating ona scale of 6 out of 10 and coughing up yellow to green colored sputum- occ. blood in sputum.  Pt has surgery July 2008 for sinuses. .  History of Present Illness:  the patient comes in today for follow-up, however it turns out that it is not for sleep apnea reasons.  She has not been able to wear CPAP and feels that it is not a viable therapy for her.  She primarily has pulmonary complaints today, for which I have never seen her in consultation.  Dr. Valentina Lucks wanted her to have a pulmonary consult, however it was mis-scheduled as a follow-up office visit.  Will therefore arrange for her to be seen in pulmonary consultation later in the week.     Past Medical History:    Current Problems:     Hx of ALLERGIC RHINITIS (ICD-477.9)    ANGINA, HX OF (ICD-V12.50)    HYPERTENSION (ICD-401.9)    SLEEPINESS (ICD-780.09)    OBSTRUCTIVE SLEEP APNEA (ICD-327.23)           Vital Signs:  Patient Profile:   65 Years Old Female Weight:      255.38 pounds O2 Sat:      99 % O2 treatment:    Room Air Temp:     98.5 degrees F oral Pulse rate:   102 / minute BP sitting:   128 / 90  (left arm) Cuff size:   large  Vitals Entered By: Cyndia Diver LPN (January 21, 2008 10:30 AM)             Comments Medications reviewed with patient  Cyndia Diver LPN  January 21, 2008 10:30 AM         Medications Added to Medication List This Visit: 1)  Nexium 40 Mg Cpdr (Esomeprazole magnesium) .... Take 1 tablet by mouth two times a day 2)  Tylenol Arthritis Pain 650 Mg Tbcr (Acetaminophen) 3)  Glucosamine  4)  Omega 3    Patient Instructions: 1)  would check out alternatives for treatment of your sleep apnea on web md...see note I gave you     ]

## 2010-10-24 NOTE — Progress Notes (Signed)
Summary: pfts,etc  ---- Converted from flag ---- ---- 02/09/2008 5:12 PM, Barbaraann Share MD wrote: needs ov to go over pfts, and also need records from Dr. Narda Bonds of ent. thanks ------------------------------  spoke with pt. pt made appt with dr. Shelle Iron for thursday may 21 @ 4:00pm. pt will be going out of town this weekend and wanted to be seen asap.  we do have records from Dr. Allene Pyo office- they are in Dr. Teddy Spike look at stack on his dictation desk.    Pt would also like her PFT report faxed to Dr. Ezzard Standing and Dr. Maurice Small. Will fax once pt is seen by Dr. Shelle Iron

## 2010-10-24 NOTE — Consult Note (Signed)
Summary: [facility]  [facility]   Imported By: Esmeralda Links D'jimraou 03/30/2008 11:35:37  _____________________________________________________________________  External Attachment:    Type:   Image     Comment:   External Document

## 2010-10-24 NOTE — Miscellaneous (Signed)
Summary: Orders Update pft charges  Clinical Lists Changes  Orders: Added new Service order of Carbon Monoxide diffusing w/capacity (94720) - Signed Added new Service order of Lung Volumes (94240) - Signed Added new Service order of Spirometry (Pre & Post) (94060) - Signed 

## 2010-10-24 NOTE — Consult Note (Signed)
Summary: dysphonia/[facility]  dysphonia/[facility]   Imported By: [place] 05/19/2008 10:19:31  _____________________________________________________________________  External Attachment:    Type:   Image     Comment:   External Document

## 2010-10-24 NOTE — Assessment & Plan Note (Signed)
Summary: f/u to discuss pfts and ongoing cough   Referred by:  Ezzard Standing (ENT) PCP:  Maurice Small  Chief Complaint:  Follow up.  Ov to discuss PFT results.  .  History of Present Illness: the patient comes in today for follow-up of her chronic cough.  At the last visit, I placed the patient on an aggressive regimen for allergic rhinitis, and sent for the records from her otolaryngologist.  The patient had a CT scan of the sinuses or in 12/10/2007 which showed mild mucosal thickening in the left maxillary sinus, but no air-fluid level.  Despite aggressive treatment of laryngopharyngeal reflux, chronic sinusitis, and possible allergic rhinitis, the patient continues to have some degree of cough.  It is still awakening her at night, and she is having persistent hoarseness as well.  She also describes difficulties at times with swallowing liquids or solids in her upper throat.  She has had pulmonary function studies done which shows no airflow obstruction, and only mild restriction that  I suspect is secondary to her obesity.        Review of Systems      See HPI   Vital Signs:  Patient Profile:   65 Years Old Female Weight:      253.25 pounds O2 Sat:      96 % O2 treatment:    Room Air Temp:     98.3 degrees F oral Pulse rate:   95 / minute BP sitting:   122 / 80  (left arm) Cuff size:   large  Vitals Entered By: Cyndia Diver LPN (Feb 12, 2008 3:48 PM)             Comments Medications reviewed with patient Cyndia Diver LPN  Feb 12, 2008 3:48 PM      Physical Exam  General:     obese female in no acute distress Lungs:     totally clear to auscultation Heart:     regular rate and rhythm, no MRG     Impression & Recommendations:  Problem # 1:  COUGH (ICD-786.2) chronic cough of unknown etiology.  The patient has known chronic sinus disease, but her most recent CT sinus shows no air fluid level and only minimal thickening.  She is being treated aggressively for  possible laryngopharyngeal reflux, and she has had recent pulmonary function studies that were unremarkable except for mild restriction because of her obesity.  There is nothing by history or exam to suggest asthma.  The patient also has chronic hoarseness which is not improving, and describes difficulty with swallowing liquids and solids at times.  At this point, I really think she would benefit from referral to the voice disorders Center at [facility].  This will allow her to have a multidisciplinary approach to a lot of the issues listed above.  The only thing I have not done from a pulmonary standpoint is methacholine challenge testing, and bronchoscopy to rule out non-asthmatic eosinophilic bronchitis.  I really think these are unlikely to yield a specific diagnosis.  I believe the majority of her issue is in the upper airway.   Patient Instructions: 1)  will refer to voice disorder clinic at [facility] 2)  continue on reflux meds, as well as sinus rinses/meds.   ]

## 2010-10-24 NOTE — Assessment & Plan Note (Signed)
Summary: consult for cough and dyspnea   PCP:  Dr. Maurice Small  Chief Complaint:  Pulmonary consult/ Pt c/o cough, chest pain, sob, and sneezing. All symptoms present since June 2008.Marland Kitchen  History of Present Illness: the patient is a 65 year old female who I've been asked to see for persistent cough as well as dyspnea.  The patient states that she has had a long-standing cough, and was found to have chronic sinusitis.  It improved significantly after sinus surgery, but now has returned and has been unrelenting.  The patient complains of severe postnasal drip, but she is being followed closely by otolaryngology with a recent CT scan of her sinuses that she was told was okay.  The patient is been treated with numerous rounds of antibiotics, but has not been on prednisone.  She does have a history of reflux disease, and is taking b.i.d. proton pump inhibitor.  She also has a history of a stricture requiring esophageal dilatation in the distant past.  She has not been on an ACE inhibitor in quite some time, and has been on Hyzaar for 10 years.  She has noticed a severe increase in her weight over the last one year, and thinks that it is at least 30 pounds.  She has no shortness of breath at rest, but will get winded vacuuming her house or bringing groceries in from the car.  The cough that she describes is dry in nature, and is associated with hoarseness.     Past Medical History:    Current Problems:     Hx of ALLERGIC RHINITIS (ICD-477.9)    ANGINA, HX OF (ICD-V12.50)    HYPERTENSION (ICD-401.9)    OBSTRUCTIVE SLEEP APNEA (ICD-327.23)-for which she refuses CPAP     dyslipidemia  Past Surgical History:    Back surgery--1995    Cholecystectomy--2007    Sinus--June 2008 and December 2008    Hysterectomy--1979    Appendix--1979   Family History:    4 brothers with allergies    mother- uterine cancer    Brother-- leukemia  Social History:    Patient states former smoker.    Risk  Factors:  Tobacco use:  quit    Year quit:  1974    Pack-years:  6 years x5 cigarettes daily    Vital Signs:  Patient Profile:   65 Years Old Female Weight:      253.2 pounds O2 Sat:      97 % O2 treatment:    Room Air Temp:     99.1 degrees F oral Pulse rate:   110 / minute BP sitting:   124 / 60  (left arm) Cuff size:   large  Vitals Entered By: Michel Bickers CMA (Jan 23, 2008 4:23 PM)             Comments Medications reviewed with patient Michel Bickers CMA  Jan 23, 2008 4:25 PM     Physical Exam  General:     obese female in no acute distress Eyes:     PERRLA and EOMI.   Nose:     patent without discharge Mouth:     clear Neck:     no JVD, thyromegaly, or lymphadenopathy. Lungs:     totally clear to auscultation Heart:     regular rate and rhythm, 2/6 systolic murmur Abdomen:     soft and nontender, bowel sounds present Extremities:     no edema noted, pulses intact distally Neurologic:     alert  and oriented, moves all 4 extremities.     Impression & Recommendations:  Problem # 1:  DYSPNEA (ICD-786.05) I suspect the patient's shortness of breath is due to her morbid obesity and deconditioning.  She has gained a significant amount of weight in a very short period of time. She had a negative CT of the chest approximately 2 weeks ago.  I think that she would benefit from full PFTs to try and make sure that she does not have obstructive disease or other pulmonary issues.  Problem # 2:  COUGH (ICD-786.2) the patient's cough  sounds much more likely to be upper airway in origin than lower.  I suspect is either due to chronic sinus disease, post nasal drip from allergic rhinitis, or possibly persistent reflux disease despite b.i.d. proton pump inhibitor.  I will need to get records from her otolaryngologist, and also will intensify her nasal hygiene regimen.   Patient Instructions: 1)  will schedule for breathing studies 2)  need to get records from Dr.  Ezzard Standing 3)  take chlorpheniramine 8mg  over the counter at bedtime 4)  trial of nasocort 2 sprays each nostril each am 5)  neil med sinus rinse kit am and pm 6)  will call you once breathing tests return    ]

## 2011-01-01 LAB — CBC
HCT: 43.1 % (ref 36.0–46.0)
Hemoglobin: 14.6 g/dL (ref 12.0–15.0)
MCHC: 33.8 g/dL (ref 30.0–36.0)
MCV: 91.6 fL (ref 78.0–100.0)
Platelets: 312 10*3/uL (ref 150–400)
RBC: 4.71 MIL/uL (ref 3.87–5.11)
RDW: 14.7 % (ref 11.5–15.5)
WBC: 4.5 10*3/uL (ref 4.0–10.5)

## 2011-01-01 LAB — COMPREHENSIVE METABOLIC PANEL
ALT: 26 U/L (ref 0–35)
AST: 40 U/L — ABNORMAL HIGH (ref 0–37)
Albumin: 4 g/dL (ref 3.5–5.2)
Alkaline Phosphatase: 77 U/L (ref 39–117)
BUN: 18 mg/dL (ref 6–23)
CO2: 28 mEq/L (ref 19–32)
Calcium: 9.3 mg/dL (ref 8.4–10.5)
Chloride: 103 mEq/L (ref 96–112)
Creatinine, Ser: 0.87 mg/dL (ref 0.4–1.2)
GFR calc non Af Amer: >60 mL/min (ref >60)
Glucose, Bld: 118 mg/dL — ABNORMAL HIGH (ref 70–99)
Potassium: 3.6 mEq/L (ref 3.5–5.1)
Sodium: 139 mEq/L (ref 135–145)
Total Bilirubin: 0.9 mg/dL (ref 0.3–1.2)
Total Protein: 7.8 g/dL (ref 6.0–8.3)

## 2011-01-01 LAB — DIFFERENTIAL
Basophils Absolute: 0 10*3/uL (ref 0.0–0.1)
Basophils Relative: 0 % (ref 0–1)
Eosinophils Absolute: 0 10*3/uL (ref 0.0–0.7)
Eosinophils Relative: 1 % (ref 0–5)
Lymphocytes Relative: 37 % (ref 12–46)
Lymphs Abs: 1.7 10*3/uL (ref 0.7–4.0)
Monocytes Absolute: 0.4 10*3/uL (ref 0.1–1.0)
Monocytes Relative: 8 % (ref 3–12)
Neutro Abs: 2.4 10*3/uL (ref 1.7–7.7)
Neutrophils Relative %: 54 % (ref 43–77)

## 2011-01-01 LAB — URINALYSIS, ROUTINE W REFLEX MICROSCOPIC
Bilirubin Urine: NEGATIVE
Glucose, UA: NEGATIVE mg/dL
Hgb urine dipstick: NEGATIVE
Ketones, ur: NEGATIVE mg/dL
Nitrite: NEGATIVE
Protein, ur: NEGATIVE mg/dL
Specific Gravity, Urine: 1.021 (ref 1.005–1.030)
Urobilinogen, UA: 0.2 mg/dL (ref 0.0–1.0)
pH: 7 (ref 5.0–8.0)

## 2011-01-01 LAB — CK TOTAL AND CKMB (NOT AT ARMC)
CK, MB: 3.6 ng/mL (ref 0.3–4.0)
Relative Index: 1.4 (ref 0.0–2.5)
Total CK: 264 U/L — ABNORMAL HIGH (ref 7–177)

## 2011-01-01 LAB — POCT CARDIAC MARKERS
CKMB, poc: 1.8 ng/mL (ref 1.0–8.0)
Myoglobin, poc: 108 ng/mL (ref 12–200)
Troponin i, poc: <0.05 ng/mL (ref 0.00–0.09)

## 2011-02-06 NOTE — Assessment & Plan Note (Signed)
The patient follows up today.  She was originally scheduled for lumbar  medial branch block.  She had L3, L4 zygapophyseal  joint injection date  Feb 10, 2007.  She had at least 50% improvement of her back pain  starting about 5 days post and continuing on for about 2 weeks duration.  She had no adverse reactions from the injection.  She states that it was  the first time in a long time that she felt relief.   She has had no new medical problems in the interval time.  She has run  out of hydrocodone, she takes this basically twice a day and she takes  the Amrix at night.   CURRENT MEDICATIONS:  1. Amrix 15 mg nightly.  2. Hydrocodone 5/325 b.i.d.   Current pain level is 8 out of 10.  She can walk 20-30 minutes, she  climbs steps, she drives.  Needs some help with shopping, household  duties.   REVIEW OF SYSTEMS:  Positive for depression at times, trouble walking,  spasms, numbness, tremor, constipation, and occasional bladder control  problems.   SOCIAL HISTORY:  Divorced, lives with a friend.  Does not have a driver  with her today.   EXAMINATION:  GENERAL:  No acute distress, mood and affect appropriate.  Her back has no tenderness to palpation except at the L4, L3 paraspinal  area.  She has pain with extension more than flexion.  Her lower  extremity strength is normal, her gait is normal.   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  She had positive results with lumbar intra-articular  injection.  As I discussed with the patient, ideally would like to see  how she responds to medial branch blocks, targeting the L2 and L3 medial  branches which means targeting the L3 SAP transverse process junction,  L4 SAP transverse process  junction.  Given her fusion, might be  difficult to access the L3 medial branch depending on the exact surgical  techniques.  I do not have her operative notes from [place], but  previous lumbar MRI showed  interbody fusion and the posterior fusion  screws were in the posterior elements of L4 and L5.  We will schedule  for lumbar medial branch blocks because if successful we can give her  longer lasting relief using lumbar radiofrequency procedure.   In interval time we will continue hydrocodone 5/325 b.i.d. with the  Amrix at night which is allowing her to function.      Erick Colace, M.D.  Electronically Signed     AEK/MedQ  D:  03/10/2007 11:42:27  T:  03/10/2007 13:42:11  Job #:  [id number]   cc:   Gretta Arab. Valentina Lucks, M.D.  Fax: [phone]

## 2011-02-06 NOTE — Procedures (Signed)
Amy [name], Amy [name]              ACCOUNT NO.:  [id number]   MEDICAL RECORD NO.:  [id number]          PATIENT TYPE:  REC   LOCATION:  [facility]                          FACILITY:  [facility]   PHYSICIAN:  Erick Colace, M.D.DATE OF BIRTH:  [birth year]   DATE OF PROCEDURE:  DATE OF DISCHARGE:                               OPERATIVE REPORT   A 65 year old female with L5-S1 fusion with lumbar axial pain, increased  extension pain persists despite medication management.   Her pain level was 7/10 at the current time.   Informed consent was obtained after describing risks and benefits of the  procedure to the patient.  These include bleeding, bruising, infection,  loss of bowel or bladder function, temporary or permanent paralysis.  She elects to proceed and has given written consent.  The patient was  placed prone on fluoroscopy table.  Betadine prep, sterile drape, 25  gauge 1.5 inch needle was used to incise the skin and subcu tissue 1%  lidocaine x2 mL and a 22 gauge 3.5 inch spinal needle was inserted,  targeting the L3-4 facet inferior recess.  Lateral and oblique imaging  utilized.  Omnipaque 180 x 0.5 mL demonstrated no intravascular uptake,  good intra-articular uptake then a solution containing 1 mL of 40 mg/mL  Depo-Medrol and 1 mL of 2% lidocaine were injected.  The same procedure  was repeated on the left side using same injectate technique, but this  time using a 22 gauge 5 inch needle with beveled tip.  The patient  tolerated the procedure well.  Pre and post injection vitals stable.  Pre injection pani level was 8/10, post injection pain level 7/10, pre  and post injection vitals stable.  Follow up in 3-4 weeks, given pain  diary.  Consider reinjection of intra-articular facets, consider medial  branch block.      Erick Colace, M.D.  Electronically Signed     AEK/MEDQ  D:  02/10/2007 09:54:20  T:  02/10/2007 10:15:35  Job:  [id number]

## 2011-02-06 NOTE — Procedures (Signed)
[name], Amy [name]              ACCOUNT NO.:  [id number]   MEDICAL RECORD NO.:  [id number]          PATIENT TYPE:  REC   LOCATION:  [facility]                          FACILITY:  [facility]   PHYSICIAN:  Erick Colace, M.D.DATE OF BIRTH:  Oct 29, 1945   DATE OF PROCEDURE:  04/07/2007  DATE OF DISCHARGE:                               OPERATIVE REPORT   DATE 04/07/07   PROCEDURE:  Bilateral L3 medial branch blocks and bilateral L2 medial  branch blocks under fluoroscopic guidance.   INDICATIONS:  Facet-mediated pain with history of L4-5 and L5-S1 fusion.  Pain is only partially responsive to narcotic analgesic management.   CONSENT:  Informed consent was obtained after describing risks and  benefits of the procedure to the patient.  These include bleeding,  bruising, infection, loss of bowel and bladder function, temporary or  permanent paralysis.  She elects proceed and has given written consent.   DESCRIPTION OF PROCEDURE:  The patient was placed prone on the  fluoroscopy table.  After Betadine prep and sterile drape a 25 gauge 1-  1/2 inch needle was used to anesthetize skin and subcutaneous tissue  with 1% lidocaine 2 mL.  Then a 22 gauge 5-inch spinal needle was  inserted under fluoroscopic guidance starting at the left L4,  L4 SAP  transverse process junction bone contact made and confirmed with lateral  imaging.  Omnipaque 180 x 0.5 mL demonstrated no intravascular uptake,  followed by injection of 0.5 mL of a solution containing 0.5 mL of 10  mg/mL dexamethasone and 2.5 mL of 2% MPF lidocaine.  Then, the left L3  SAP transverse process junction targeted bone contact made and confirmed  with lateral imaging.  Omnipaque 180 x 0.5 mL demonstrated no  intravascular uptake and 0.5 mL dexamethasone lidocaine solution  injected.  The same procedure was repeated on the left side using the  same injectate, same technique and equipment.  The patient tolerated the  procedure well.   Pre-injection pain level is 5/10; post-injection 0/10.  We will schedule for repeat medial branch blocks.  If only partially  relief of pain, would consider going up one more level to include the L1  medial branch.  I have refilled herAmrix 15mg  and hydrocodone 5/325  b.i.d.      Erick Colace, M.D.  Electronically Signed     AEK/MEDQ  D:  04/07/2007 14:45:40  T:  04/08/2007 09:16:40  Job:  [id number]

## 2011-02-09 NOTE — Op Note (Signed)
NAMEJODETTE, WIK              ACCOUNT NO.:  [id number]   MEDICAL RECORD NO.:  [id number]          PATIENT TYPE:  AMB   LOCATION:  DAY                          FACILITY:  [facility]   PHYSICIAN:  Thomas A. Cornett, M.D.DATE OF BIRTH:  Oct 27, 1945   DATE OF PROCEDURE:  DATE OF DISCHARGE:                                 OPERATIVE REPORT   PREOPERATIVE DIAGNOSIS:  Symptomatic cholelithiasis.   POSTOPERATIVE DIAGNOSIS:  Symptomatic cholelithiasis.   PROCEDURE:  Laparoscopic cholecystectomy.   SURGEON:  Maisie Fus A. Cornett, M.D.   ASSISTANT:  Anselm Pancoast. Zachery Dakins, M.D.   ANESTHESIA:  General endotracheal anesthesia with 10 cc of 0.25% tetracaine.   ESTIMATED BLOOD LOSS:  20 cc.   SPECIMENS:  Gallbladder with gallstones to pathology.   INDICATIONS FOR PROCEDURE:  The patient is a 65 year old female who has had  progressive right upper quadrant pain after meals.  She was found to have  symptomatic cholelithiasis.  The procedure was discussed with her which  included laparoscopic possible open cholecystectomy with its risks, benefits  and long-term outcomes discussed in full detail with the patient.  She  voiced understanding and agreed to proceed.   DESCRIPTION OF PROCEDURE:  The patient was brought to the operating room and  placed supine.  After induction of general endotracheal anesthesia, the  abdomen was prepped and draped in a sterile fashion.  In-and-out  catheterization was done to decompress the bladder.  The patient had  previous laparotomy so I used a 5-mm Optiview port for access into the right  upper quadrant.  A small incision was made and the camera was placed in the  Optiview port, and this was then gently advanced through the abdominal wall,  visualizing all layers well.  First attempts ended up with the catheter in  the preperitoneal space.  I then had to switch to a longer Optiview port,  and I was able to push the peritoneum into the abdominal cavity without  injury to the colon or small bowel or other viscus.  Once we had access to  the abdominal cavity was insufflated to 15 mmHg of CO2 and replaced the  camera.  Laparoscopy was performed.  Again, there was no evidence of bowel  injury with insertion of the port.  An 11-mm subxiphoid port was placed  under direct vision.  Once this was in place we were able to place one other  5-mm port in the right lower quadrant under direct vision.  I then took the  laparoscopic scissors and took down some midline adhesions to free up the  right lower quadrant for placement of an 11-mm port for the camera, which we  were able to do quite easily with minimal adhesiolysis.  An 11-mm port was  then placed, under direct vision, without difficulty.  Next, the gallbladder  was identified and the patient was placed in reverse Trendelenburg and  rolled to her left.  The dome was grasped and held up.  There were some  omental adhesions that I took down with a combination of sharp dissection  and cautery.  This helped to expose the  entire body of the gallbladder and  the infundibulum.  A second grasper was used to grasp the infundibulum and  pull it towards the patient's right lower quadrant, those exposing the  triangle of Calot.  Once this was done I was able to dissect up the cystic  duct as the only tubular structure entering the gallbladder.  A clip was  placed on the gallbladder side of the duct and, through a separate stab  incision, a Reddick catheter was placed for intraoperative cholangiogram.  Incision was made in the cystic duct, the catheter was placed and held in  place by a clip.  One-half strength Hypaque dye was used and cholangiogram  was obtained with fluoroscopy.  We had some problems filling the common  ducts so I switched to full-strength Hypaque dye.  These images showed the  cystic duct entering the common duct with free flow of contrast down the  common duct into the duodenum without stones,  stricture or leak.  We then  were able to visualize the common hepatic duct and its bifurcation into the  right and left branches without any evidence of obstruction, leak, stone or  stricture.  At this point in time the catheter was removed, the cystic duct  stump was triple clipped and divided.  Next, cystic artery was identified as  the only vascular structure entering the gallbladder.  It was triple clipped  and divided.  Cautery was used to dissect the gallbladder from the  gallbladder fossa.  A small hole was made in the gallbladder with spillage  of bile, but no stones.  This was suctioned out quite easily.  Once the  gallbladder was removed from the gallbladder fossa, an EndoCatch bag was  placed and the gallbladder was placed in the EndoCatch bag and extracted.  Irrigation was used and suctioned out until clear.  The gallbladder bed was  examined and was hemostatic.  Clips were on the cystic duct and the cystic  artery, and again there was no leakage of bile or bleeding at this point in  time of the case.  I reinspected the small bowel and colon, as well as  stomach and other solid viscera, including the liver, which showed no signs  of injury with trocar insertion or from the procedure.  At this point in  time the patient was placed head-down and all the excess irrigation was  suctioned out, as well as the air, and ports were subsequently removed at  this point in time with no signs of port site bleeding.  Once the ports were  removed, we closed the skin incisions with 4-0 Monocryl subcuticular  stitches.  Steri-Strips and dry dressings were applied.  All final counts,  of sponge, needle and instruments, were found to be correct at this point in  time.  The patient was awakened and taken to recovery in satisfactory  condition.      Thomas A. Cornett, M.D.  Electronically Signed     TAC/MEDQ  D:  01/11/2006  T:  01/13/2006  Job:  [id number]  cc:   Gretta Arab. Valentina Lucks, M.D.   Fax: [phone]
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
## 2011-02-09 NOTE — Procedures (Signed)
NAMEHORTENSE, CANTRALL NO.:  [id number]   MEDICAL RECORD NO.:  [id number]          PATIENT TYPE:  OUT   LOCATION:  SLEEP CENTER                 FACILITY[facility]  [facility]   PHYSICIAN:  Marcelyn Bruins, M.D. [facility] DATE OF BIRTH:  May 29, 1946   DATE OF STUDY:  01/24/2006                              NOCTURNAL POLYSOMNOGRAM   REFERRING PHYSICIAN:  Dr. Marcelyn Bruins.   INDICATIONS FOR STUDY:  Hypersomnia with sleep apnea.   EPWORTH SCORE:  13.   SLEEP ARCHITECTURE:  The patient had a total sleep time of 316 minutes with  decreased REM and slow wave sleep.  Sleep onset latency was mildly prolonged  at 47 minutes with REM latency of 173 minutes.  Sleep efficiency was  decreased at 76%.   RESPIRATORY DATA:  The patient was found to have 36 hypopneas and 14 apneas  for a respiratory disturbance index of 10 events per hour.  Events were not  positional but there was mild to moderate snoring noted throughout.  The  patient did not reach split night protocol secondary to small numbers of  events.   OXYGEN DATA:  His O2 desaturation as low as 83% with the patient's  obstructive events.   CARDIAC DATA:  No clinically significant cardiac arrhythmias.   MOVEMENT/PARASOMNIAS:  None.   IMPRESSION/RECOMMENDATIONS:  Mild obstructive sleep apnea/hypopnea syndrome  with a respiratory disturbance index of 10 events per hour and O2  desaturation as low as 83%.  Treatment for this degree of sleep apnea may  include weight loss alone if applicable, upper airway surgery, oral  appliance, and also CPAP.           ______________________________  Marcelyn Bruins, M.D. [facility]  Diplomate, American Board of Sleep  Medicine     KC/MEDQ  D:  02/07/2006 16:31:25  T:  02/08/2006 09:55:29  Job:  [id number]

## 2011-04-09 ENCOUNTER — Emergency Department (HOSPITAL_COMMUNITY): Payer: Medicare Other

## 2011-04-09 ENCOUNTER — Emergency Department (HOSPITAL_COMMUNITY)
Admission: EM | Admit: 2011-04-09 | Discharge: 2011-04-10 | Disposition: A | Discharge status: Home / Self Care | Payer: Medicare Other | Attending: Emergency Medicine | Admitting: Emergency Medicine

## 2011-04-09 DIAGNOSIS — Z79899 Other long term (current) drug therapy: Secondary | ICD-10-CM | POA: Insufficient documentation

## 2011-04-09 DIAGNOSIS — I10 Essential (primary) hypertension: Secondary | ICD-10-CM | POA: Insufficient documentation

## 2011-04-09 DIAGNOSIS — R630 Anorexia: Secondary | ICD-10-CM | POA: Insufficient documentation

## 2011-04-09 DIAGNOSIS — Z87891 Personal history of nicotine dependence: Secondary | ICD-10-CM | POA: Insufficient documentation

## 2011-04-09 DIAGNOSIS — R61 Generalized hyperhidrosis: Secondary | ICD-10-CM | POA: Insufficient documentation

## 2011-04-09 DIAGNOSIS — R109 Unspecified abdominal pain: Secondary | ICD-10-CM | POA: Insufficient documentation

## 2011-04-09 DIAGNOSIS — R110 Nausea: Secondary | ICD-10-CM | POA: Insufficient documentation

## 2011-04-09 DIAGNOSIS — R509 Fever, unspecified: Secondary | ICD-10-CM | POA: Insufficient documentation

## 2011-04-09 DIAGNOSIS — F411 Generalized anxiety disorder: Secondary | ICD-10-CM | POA: Insufficient documentation

## 2011-04-09 LAB — COMPREHENSIVE METABOLIC PANEL
ALT: 46 U/L — ABNORMAL HIGH (ref 0–35)
AST: 89 U/L — ABNORMAL HIGH (ref 0–37)
Albumin: 3.7 g/dL (ref 3.5–5.2)
Alkaline Phosphatase: 124 U/L — ABNORMAL HIGH (ref 39–117)
BUN: 15 mg/dL (ref 6–23)
CO2: 29 mEq/L (ref 19–32)
Calcium: 9.4 mg/dL (ref 8.4–10.5)
Chloride: 100 mEq/L (ref 96–112)
Creatinine, Ser: 0.69 mg/dL (ref 0.50–1.10)
GFR calc Af Amer: >60 mL/min (ref >60)
GFR calc non Af Amer: >60 mL/min (ref >60)
Glucose, Bld: 100 mg/dL — ABNORMAL HIGH (ref 70–99)
Potassium: 3.4 mEq/L — ABNORMAL LOW (ref 3.5–5.1)
Sodium: 138 mEq/L (ref 135–145)
Total Bilirubin: 0.3 mg/dL (ref 0.3–1.2)
Total Protein: 7.8 g/dL (ref 6.0–8.3)

## 2011-04-09 LAB — DIFFERENTIAL
Basophils Absolute: 0 10*3/uL (ref 0.0–0.1)
Basophils Relative: 0 % (ref 0–1)
Eosinophils Absolute: 0.1 10*3/uL (ref 0.0–0.7)
Eosinophils Relative: 1 % (ref 0–5)
Lymphocytes Relative: 48 % — ABNORMAL HIGH (ref 12–46)
Lymphs Abs: 2.3 10*3/uL (ref 0.7–4.0)
Monocytes Absolute: 0.6 10*3/uL (ref 0.1–1.0)
Monocytes Relative: 12 % (ref 3–12)
Neutro Abs: 1.9 10*3/uL (ref 1.7–7.7)
Neutrophils Relative %: 39 % — ABNORMAL LOW (ref 43–77)

## 2011-04-09 LAB — URINALYSIS, ROUTINE W REFLEX MICROSCOPIC
Bilirubin Urine: NEGATIVE
Glucose, UA: NEGATIVE mg/dL
Hgb urine dipstick: NEGATIVE
Ketones, ur: NEGATIVE mg/dL
Leukocytes, UA: NEGATIVE
Nitrite: NEGATIVE
Protein, ur: NEGATIVE mg/dL
Specific Gravity, Urine: 1.017 (ref 1.005–1.030)
Urobilinogen, UA: 1 mg/dL (ref 0.0–1.0)
pH: 7 (ref 5.0–8.0)

## 2011-04-09 LAB — CBC
HCT: 39.9 % (ref 36.0–46.0)
Hemoglobin: 13.8 g/dL (ref 12.0–15.0)
MCH: 30.1 pg (ref 26.0–34.0)
MCHC: 34.6 g/dL (ref 30.0–36.0)
MCV: 86.9 fL (ref 78.0–100.0)
Platelets: 317 10*3/uL (ref 150–400)
RBC: 4.59 MIL/uL (ref 3.87–5.11)
RDW: 14.3 % (ref 11.5–15.5)
WBC: 4.8 10*3/uL (ref 4.0–10.5)

## 2011-04-09 LAB — URINE MICROSCOPIC-ADD ON

## 2011-04-09 LAB — LIPASE, BLOOD: Lipase: 41 U/L (ref 11–59)

## 2011-04-09 MED ORDER — IOHEXOL 300 MG/ML  SOLN
100.0000 mL | Freq: Once | INTRAMUSCULAR | Status: AC | PRN
  Administered 2011-04-09: 100 mL via INTRAVENOUS

## 2011-05-01 ENCOUNTER — Other Ambulatory Visit: Payer: Self-pay | Admitting: Orthopedic Surgery

## 2011-05-01 DIAGNOSIS — M545 Low back pain, unspecified: Secondary | ICD-10-CM

## 2011-05-03 ENCOUNTER — Ambulatory Visit
Admission: RE | Admit: 2011-05-03 | Discharge: 2011-05-03 | Disposition: A | Discharge status: Home / Self Care | Payer: Medicare Other | Source: Ambulatory Visit | Attending: Orthopedic Surgery | Admitting: Orthopedic Surgery

## 2011-05-03 DIAGNOSIS — M545 Low back pain, unspecified: Secondary | ICD-10-CM

## 2011-06-08 ENCOUNTER — Encounter (HOSPITAL_COMMUNITY)
Admission: RE | Admit: 2011-06-08 | Discharge: 2011-06-08 | Disposition: A | Discharge status: Home / Self Care | Payer: Medicare Other | Source: Ambulatory Visit | Attending: Orthopedic Surgery | Admitting: Orthopedic Surgery

## 2011-06-08 ENCOUNTER — Other Ambulatory Visit (HOSPITAL_COMMUNITY): Payer: Self-pay | Admitting: Orthopedic Surgery

## 2011-06-08 DIAGNOSIS — M47816 Spondylosis without myelopathy or radiculopathy, lumbar region: Secondary | ICD-10-CM

## 2011-06-08 DIAGNOSIS — M4316 Spondylolisthesis, lumbar region: Secondary | ICD-10-CM

## 2011-06-08 LAB — COMPREHENSIVE METABOLIC PANEL
ALT: 20 U/L (ref 0–35)
AST: 20 U/L (ref 0–37)
Albumin: 4.3 g/dL (ref 3.5–5.2)
Alkaline Phosphatase: 106 U/L (ref 39–117)
BUN: 14 mg/dL (ref 6–23)
CO2: 30 mEq/L (ref 19–32)
Calcium: 9.8 mg/dL (ref 8.4–10.5)
Chloride: 99 mEq/L (ref 96–112)
Creatinine, Ser: 0.78 mg/dL (ref 0.50–1.10)
GFR calc Af Amer: >60 mL/min (ref >60)
GFR calc non Af Amer: >60 mL/min (ref >60)
Glucose, Bld: 108 mg/dL — ABNORMAL HIGH (ref 70–99)
Potassium: 3.7 mEq/L (ref 3.5–5.1)
Sodium: 139 mEq/L (ref 135–145)
Total Bilirubin: 0.5 mg/dL (ref 0.3–1.2)
Total Protein: 8.1 g/dL (ref 6.0–8.3)

## 2011-06-08 LAB — PROTIME-INR
INR: 0.91 (ref 0.00–1.49)
Prothrombin Time: 12.5 seconds (ref 11.6–15.2)

## 2011-06-08 LAB — CBC
HCT: 41.1 % (ref 36.0–46.0)
Hemoglobin: 14.1 g/dL (ref 12.0–15.0)
MCH: 29.7 pg (ref 26.0–34.0)
MCHC: 34.3 g/dL (ref 30.0–36.0)
MCV: 86.5 fL (ref 78.0–100.0)
Platelets: 350 10*3/uL (ref 150–400)
RBC: 4.75 MIL/uL (ref 3.87–5.11)
RDW: 14 % (ref 11.5–15.5)
WBC: 5.3 10*3/uL (ref 4.0–10.5)

## 2011-06-08 LAB — DIFFERENTIAL
Basophils Absolute: 0 10*3/uL (ref 0.0–0.1)
Basophils Relative: 0 % (ref 0–1)
Eosinophils Absolute: 0.1 10*3/uL (ref 0.0–0.7)
Eosinophils Relative: 2 % (ref 0–5)
Lymphocytes Relative: 49 % — ABNORMAL HIGH (ref 12–46)
Lymphs Abs: 2.6 10*3/uL (ref 0.7–4.0)
Monocytes Absolute: 0.5 10*3/uL (ref 0.1–1.0)
Monocytes Relative: 9 % (ref 3–12)
Neutro Abs: 2.1 10*3/uL (ref 1.7–7.7)
Neutrophils Relative %: 39 % — ABNORMAL LOW (ref 43–77)

## 2011-06-08 LAB — URINALYSIS, ROUTINE W REFLEX MICROSCOPIC
Bilirubin Urine: NEGATIVE
Glucose, UA: NEGATIVE mg/dL
Hgb urine dipstick: NEGATIVE
Ketones, ur: NEGATIVE mg/dL
Leukocytes, UA: NEGATIVE
Nitrite: NEGATIVE
Protein, ur: NEGATIVE mg/dL
Specific Gravity, Urine: 1.015 (ref 1.005–1.030)
Urobilinogen, UA: 1 mg/dL (ref 0.0–1.0)
pH: 7.5 (ref 5.0–8.0)

## 2011-06-08 LAB — SURGICAL PCR SCREEN
MRSA, PCR: POSITIVE — AB
Staphylococcus aureus: POSITIVE — AB

## 2011-06-08 LAB — ABO/RH: ABO/RH(D): B POS

## 2011-06-08 LAB — TYPE AND SCREEN
ABO/RH(D): B POS
Antibody Screen: NEGATIVE

## 2011-06-08 LAB — APTT: aPTT: 32 seconds (ref 24–37)

## 2011-06-12 ENCOUNTER — Inpatient Hospital Stay (HOSPITAL_COMMUNITY): Payer: Medicare Other

## 2011-06-12 ENCOUNTER — Other Ambulatory Visit: Payer: Self-pay | Admitting: Orthopedic Surgery

## 2011-06-12 ENCOUNTER — Inpatient Hospital Stay (HOSPITAL_COMMUNITY)
Admission: RE | Admit: 2011-06-12 | Discharge: 2011-06-18 | DRG: 457 | Disposition: A | Discharge status: Home Health | Payer: Medicare Other | Source: Ambulatory Visit | Attending: Orthopedic Surgery | Admitting: Orthopedic Surgery

## 2011-06-12 DIAGNOSIS — M4000 Postural kyphosis, site unspecified: Principal | ICD-10-CM | POA: Diagnosis present

## 2011-06-12 DIAGNOSIS — Z79899 Other long term (current) drug therapy: Secondary | ICD-10-CM

## 2011-06-12 DIAGNOSIS — Z01818 Encounter for other preprocedural examination: Secondary | ICD-10-CM

## 2011-06-12 DIAGNOSIS — N390 Urinary tract infection, site not specified: Secondary | ICD-10-CM | POA: Diagnosis not present

## 2011-06-12 DIAGNOSIS — I10 Essential (primary) hypertension: Secondary | ICD-10-CM | POA: Diagnosis present

## 2011-06-12 DIAGNOSIS — M47817 Spondylosis without myelopathy or radiculopathy, lumbosacral region: Secondary | ICD-10-CM | POA: Diagnosis present

## 2011-06-12 DIAGNOSIS — IMO0001 Reserved for inherently not codable concepts without codable children: Secondary | ICD-10-CM | POA: Diagnosis present

## 2011-06-12 DIAGNOSIS — Z7982 Long term (current) use of aspirin: Secondary | ICD-10-CM

## 2011-06-12 DIAGNOSIS — M48061 Spinal stenosis, lumbar region without neurogenic claudication: Secondary | ICD-10-CM | POA: Diagnosis present

## 2011-06-12 DIAGNOSIS — Z472 Encounter for removal of internal fixation device: Secondary | ICD-10-CM

## 2011-06-12 DIAGNOSIS — K219 Gastro-esophageal reflux disease without esophagitis: Secondary | ICD-10-CM | POA: Diagnosis present

## 2011-06-12 DIAGNOSIS — Z01812 Encounter for preprocedural laboratory examination: Secondary | ICD-10-CM

## 2011-06-12 DIAGNOSIS — Z23 Encounter for immunization: Secondary | ICD-10-CM

## 2011-06-12 DIAGNOSIS — E669 Obesity, unspecified: Secondary | ICD-10-CM | POA: Diagnosis present

## 2011-06-12 HISTORY — PX: OTHER SURGICAL HISTORY: SHX169

## 2011-06-13 LAB — CBC
HCT: 30.7 % — ABNORMAL LOW (ref 36.0–46.0)
Hemoglobin: 10.5 g/dL — ABNORMAL LOW (ref 12.0–15.0)
MCH: 29.3 pg (ref 26.0–34.0)
MCHC: 34.2 g/dL (ref 30.0–36.0)
MCV: 85.8 fL (ref 78.0–100.0)
Platelets: 228 10*3/uL (ref 150–400)
RBC: 3.58 MIL/uL — ABNORMAL LOW (ref 3.87–5.11)
RDW: 14 % (ref 11.5–15.5)
WBC: 12.6 10*3/uL — ABNORMAL HIGH (ref 4.0–10.5)

## 2011-06-13 LAB — BASIC METABOLIC PANEL
BUN: 12 mg/dL (ref 6–23)
CO2: 31 mEq/L (ref 19–32)
Calcium: 8 mg/dL — ABNORMAL LOW (ref 8.4–10.5)
Chloride: 101 mEq/L (ref 96–112)
Creatinine, Ser: 0.64 mg/dL (ref 0.50–1.10)
GFR calc Af Amer: >60 mL/min (ref >60)
GFR calc non Af Amer: >60 mL/min (ref >60)
Glucose, Bld: 124 mg/dL — ABNORMAL HIGH (ref 70–99)
Potassium: 3 mEq/L — ABNORMAL LOW (ref 3.5–5.1)
Sodium: 138 mEq/L (ref 135–145)

## 2011-06-13 NOTE — Op Note (Signed)
NAMENICOLINA, HIRT NO.:  [id number]  MEDICAL RECORD NO.:  [id number]  LOCATION:  5028                         FACILITY:  [facility]  PHYSICIAN:  Nelda Severe, MD      DATE OF BIRTH:  [birth year]  DATE OF PROCEDURE:  06/12/2011 DATE OF DISCHARGE:                              OPERATIVE REPORT   SURGEON:  Nelda Severe, MD  ASSISTANT:  Lianne Cure, PA-C  PREOPERATIVE DIAGNOSES:  Status post L4-S1 fusion (remote), L3-4 severe spinal stenosis with anterior subluxation and kyphosis; L2-3 spondylosis.  POSTOPERATIVE DIAGNOSES:  Status post L4-S1 fusion (remote), L3-4 severe spinal stenosis with anterior subluxation and kyphosis; L2-3 spondylosis.  OPERATIVE PROCEDURE:  Removal of translaminar screws, L4-5 to facilitate bone graft harvest; local bone graft harvest, L2-3, L3-4, and L4-5; bilateral laminectomy at L3-4 including bilateral facetectomy; posterior interbody fusion L3-4; posterolateral fusion L2-3, L3-4; insertion of titanium (Titan) interbody cage, left L3-4; insertion of bilateral pedicle screws L2, L3, and L4 with bilateral rods.  EMG/spinal cord monitoring.  OPERATIVE NOTE:  The patient was placed under general endotracheal anesthesia.  A Foley catheter was placed in the bladder.  Sequential compression devices were placed on both lower extremities.  Vancomycin was infused intravenously for prophylaxis against infection.  The patient was positioned prone on a Jackson frame, care being taken to position the upper extremities so as to avoid hyperflexion and abduction of the shoulders and so as to avoid hyperflexion of the elbows.  The upper extremities were padded with foam from axilla to hands.  The thighs, knees, shins, and ankles were supported on pillows.  The previous midline incision was marked with a skin marker, and site of the extended new incision marked vertically in the midline above the prior incision.  The lumbar area was prepped  with DuraPrep and draped in rectangular fashion, and the drapes secured with Ioban.  A time-out was held at which point all of the usual parameters were discussed/confirmed.  The proposed incision was scored in the skin including an ellipse for excision of the upper one-half to two-thirds of the previously made midline incision.  The subcutaneous tissue was injected with a mixture of 0.25% plain Marcaine and 1% lidocaine with epinephrine.  The scar was excised in elliptical fashion using cutting current, and the more proximal portion of the incision deepened to the spinous processes using cutting current.  Paraspinal muscles were mobilized bilaterally from the L1-2 apophyseal joint through approximately the level of the L5 lamina as evidenced by the fact that we easily identified the heads of the translaminar screws, which had previously been placed at L4-5 and L5-S1.  The transverse processes of L2, L3, and L4 were exposed bilaterally, initially on the left and then on the right.  A Kocher clamp was placed on what turned out to be L2 and a cross-table lateral fluoroscopic view taken and saved.  Level identification was facilitated by the presence of the previously placed hardware.  We then proceeded to remove the translaminar screws at L4-5 bilaterally. This was done to facilitate harvesting of bone graft from the rather robust fusion mass posteriorly at that level.  We then used an acetabular reamer to remove a moderately large quantity of bone from the upper  portion of the fusion mass posteriorly as well as from the lamina and inferior facets at L3 and L2.  Next, we made pedicle holes at L4, L3, and L2 on the left side.  These were all made in the same fashion by identifying the junction of the superior articular process and transverse process, perforating the posterior pedicle with an awl, and then making a hole through the pedicle into the vertebral body using a special  pedicle seeking probe. Each pedicle hole was carefully palpated with a ball-tip probe to make sure it was circumferentially intact and sounded for depth and the depths recorded.  At this point, we had injected all the holes with FloSeal on the left side.  The transverse processes of L4, L3, and L2 were all decorticated with a high-speed bur.  We then took moderately large quantity of morcellized graft, and contained in a small tube made from Surgicel and sutured with 2-0 Vicryl.  This was done in order to prevent the graft from escaping from the posterolateral gutters during the rest of the procedure.  This tube was then placed between the transverse process of L4 distally and L2 proximally.  6.5 mm screws were then placed in the holes after tapping each hole.  At this point, stimulation of each screw was carried out and distal EMG activity recorded.  In each instance, the number of milliamperes necessary to elicit distal activity was in a safe zone, meaning there was little likelihood of any direct contact between the screw thread and nerve root.  A 70-mm rod was then chosen and precontoured and provisionally attached to the screws.  Next, we carried out the same procedure on the right side including graft placement in a tube made from Surgicel.  A rod was also precontoured and provisionally attached.  Then on the left side, I completed the facetectomy and L3 laminectomy at L3-4.  Foraminal veins and epidural veins were bipolar coagulated.  The cauda equina and proximal origin of the L4 nerve root were then retracted medially to reveal the L3-4 disk, which was soft and protruding.  There was no sequestered fragment.  An annulotomy was then performed and a series of curettes and rongeurs used to prepare the disk space for interbody fusion.  When the endplates had been adequately prepared after thoroughly enucleation of the disk, the disk spaces distracted using an intradiskal 14-mm  distraction and the set screws on the rod at L4 and L3 tightened to hold the distraction.  A 12-mm Titan interbody implant was then chosen and packed with bone graft.  A special funnel was placed anteriorly in the disk space and bone placed anterior to where the cage was going to be placed.  We then impacted the cage into position and loosen the set screws to allow the endplates to grip the cage.  A cross-table lateral fluoroscopic view was taken and showed satisfactory position of all screws and cage.  AP view was also taken, which looked satisfactory.  Not mentioned above is the fact that all screws on the right side were stimulated and again, the current to stimulate distal EMG activity was in a safe zone.  I then completed the laminectomy on the patient's right side at L3-4 to relieve the spinal stenosis and this included a complete inferior facetectomy and partial superior facetectomy.  At L2-3, we had already, by harvesting graft using acetabular reamer resected a great deal of the articular surface of the L2-3 facet joints bilaterally.  Bone graft  was then packed into that area posteriorly.  Dry Gelfoam was placed superficial to the laminectomy site bilaterally to prevent bone migrating into the laminectomy.  After torquing all the screws, the wound was closed over a 15-gauge Blake drain placed subfascially and brought out through the skin to the right side and secured with a 2-0 nylon suture.  The thoracolumbar fascia was closed using interrupted #1 Vicryl sutures in simple and figure-of-eight fashion.  A one-eighth inch Hemovac drain was placed in the subcutaneous layer and brought out through the skin to the right side were it was secured with a nylon stitch.  The subcutaneous layer was closed using multiple interrupted inverted 2-0 undyed Vicryl sutures.  The skin was closed using a subcuticular 3-0 undyed Vicryl suture in running fashion.  Dermabond was applied and the  dressing.  The patient was then placed on her bed and awakened, which took quite some time.  There were no intraoperative complications.  Sponge and needle counts were correct.  The blood loss was estimated at 800 mL, and certainly this patient oozed more during the procedure than the average patient.  She ultimately returned to recovery room in satisfactory condition, was able to move all four limbs.     Nelda Severe, MD     MT/MEDQ  D:  06/12/2011  T:  06/13/2011  Job:  [id number]  Electronically Signed by Nelda Severe MD on 06/13/2011 02:05:36 PM

## 2011-06-14 LAB — CBC
HCT: 26 % — ABNORMAL LOW (ref 36.0–46.0)
Hemoglobin: 8.9 g/dL — ABNORMAL LOW (ref 12.0–15.0)
MCH: 30 pg (ref 26.0–34.0)
MCHC: 34.2 g/dL (ref 30.0–36.0)
MCV: 87.5 fL (ref 78.0–100.0)
Platelets: 197 10*3/uL (ref 150–400)
RBC: 2.97 MIL/uL — ABNORMAL LOW (ref 3.87–5.11)
RDW: 14.6 % (ref 11.5–15.5)
WBC: 11.2 10*3/uL — ABNORMAL HIGH (ref 4.0–10.5)

## 2011-06-14 LAB — POCT I-STAT 4, (NA,K, GLUC, HGB,HCT)
Glucose, Bld: 144 mg/dL — ABNORMAL HIGH (ref 70–99)
HCT: 31 % — ABNORMAL LOW (ref 36.0–46.0)
Hemoglobin: 10.5 g/dL — ABNORMAL LOW (ref 12.0–15.0)
Potassium: 3.3 mEq/L — ABNORMAL LOW (ref 3.5–5.1)
Sodium: 138 mEq/L (ref 135–145)

## 2011-06-14 LAB — BASIC METABOLIC PANEL
BUN: 12 mg/dL (ref 6–23)
CO2: 31 mEq/L (ref 19–32)
Calcium: 8.1 mg/dL — ABNORMAL LOW (ref 8.4–10.5)
Chloride: 101 mEq/L (ref 96–112)
Creatinine, Ser: 0.56 mg/dL (ref 0.50–1.10)
GFR calc Af Amer: >60 mL/min (ref >60)
GFR calc non Af Amer: >60 mL/min (ref >60)
Glucose, Bld: 110 mg/dL — ABNORMAL HIGH (ref 70–99)
Potassium: 2.9 mEq/L — ABNORMAL LOW (ref 3.5–5.1)
Sodium: 137 mEq/L (ref 135–145)

## 2011-06-15 LAB — CBC
HCT: 26 % — ABNORMAL LOW (ref 36.0–46.0)
Hemoglobin: 8.6 g/dL — ABNORMAL LOW (ref 12.0–15.0)
MCH: 29.4 pg (ref 26.0–34.0)
MCHC: 33.1 g/dL (ref 30.0–36.0)
MCV: 88.7 fL (ref 78.0–100.0)
Platelets: 203 10*3/uL (ref 150–400)
RBC: 2.93 MIL/uL — ABNORMAL LOW (ref 3.87–5.11)
RDW: 14.4 % (ref 11.5–15.5)
WBC: 11 10*3/uL — ABNORMAL HIGH (ref 4.0–10.5)

## 2011-06-15 LAB — BASIC METABOLIC PANEL
BUN: 9 mg/dL (ref 6–23)
CO2: 34 mEq/L — ABNORMAL HIGH (ref 19–32)
Calcium: 8.5 mg/dL (ref 8.4–10.5)
Chloride: 100 mEq/L (ref 96–112)
Creatinine, Ser: 0.58 mg/dL (ref 0.50–1.10)
GFR calc Af Amer: >60 mL/min (ref >60)
GFR calc non Af Amer: >60 mL/min (ref >60)
Glucose, Bld: 109 mg/dL — ABNORMAL HIGH (ref 70–99)
Potassium: 3 mEq/L — ABNORMAL LOW (ref 3.5–5.1)
Sodium: 139 mEq/L (ref 135–145)

## 2011-06-16 LAB — BASIC METABOLIC PANEL
BUN: 6 mg/dL (ref 6–23)
CO2: 36 mEq/L — ABNORMAL HIGH (ref 19–32)
Calcium: 8.6 mg/dL (ref 8.4–10.5)
Chloride: 99 mEq/L (ref 96–112)
Creatinine, Ser: 0.62 mg/dL (ref 0.50–1.10)
GFR calc Af Amer: >60 mL/min (ref >60)
GFR calc non Af Amer: >60 mL/min (ref >60)
Glucose, Bld: 98 mg/dL (ref 70–99)
Potassium: 3.5 mEq/L (ref 3.5–5.1)
Sodium: 139 mEq/L (ref 135–145)

## 2011-06-16 LAB — URINALYSIS, ROUTINE W REFLEX MICROSCOPIC
Bilirubin Urine: NEGATIVE
Glucose, UA: NEGATIVE mg/dL
Ketones, ur: NEGATIVE mg/dL
Nitrite: POSITIVE — AB
Protein, ur: NEGATIVE mg/dL
Specific Gravity, Urine: 1.012 (ref 1.005–1.030)
Urobilinogen, UA: 2 mg/dL — ABNORMAL HIGH (ref 0.0–1.0)
pH: 7.5 (ref 5.0–8.0)

## 2011-06-16 LAB — CBC
HCT: 24.1 % — ABNORMAL LOW (ref 36.0–46.0)
Hemoglobin: 8.1 g/dL — ABNORMAL LOW (ref 12.0–15.0)
MCH: 29.5 pg (ref 26.0–34.0)
MCHC: 33.6 g/dL (ref 30.0–36.0)
MCV: 87.6 fL (ref 78.0–100.0)
Platelets: 232 10*3/uL (ref 150–400)
RBC: 2.75 MIL/uL — ABNORMAL LOW (ref 3.87–5.11)
RDW: 14.3 % (ref 11.5–15.5)
WBC: 9.5 10*3/uL (ref 4.0–10.5)

## 2011-06-16 LAB — URINE MICROSCOPIC-ADD ON

## 2011-06-25 NOTE — Discharge Summary (Signed)
  Amy [name], MORITZ NO.:  [id number]  MEDICAL RECORD NO.:  [id number]  LOCATION:  5028                         FACILITY:  [facility]  PHYSICIAN:  Nelda Severe, MD      DATE OF BIRTH:  03-12-1946  DATE OF ADMISSION:  06/12/2011 DATE OF DISCHARGE:  06/18/2011                              DISCHARGE SUMMARY   HISTORY AND HOSPITAL COURSE:  This woman was admitted for management of spinal stenosis/stenosis of the lumbar spine following prior lumbar fusion.  On the day of admission, she was taken the operating room where an L3-L4 laminectomy was performed and the fusion extended to L2 from the L4-L5, L5-S1 level.  Postoperatively, there has been no specific complication other than his slowness to rehab, in part secondary to obesity.  She did have a urinary tract infection, although she did not retain her Foley catheter for a prolonged period of time.  This has been treated with Bactrim DS.  She will finish her course of Bactrim at home, total 3 days as per advised by pharmacy.  At the present time, she will require durable medical equipment at home including a hospital bed.  The discharge planning nurse has seen her and arranged for this to be provided as well as a home health aide.  At the time of discharge, she is ambulatory with a walker, with some difficulty get herself in and out of bed, and ambulate with a walker.  Pain control at the present time is with oral medication in the form of Norco 10, one to two q.4 h., maximum 8 per day and she has been given prescription for that.  ADDITIONAL DISCHARGE MEDICATIONS: 1. Gabapentin 600 mg t.i.d. 2. Bactrim DS 1 b.i.d. for another day-and-half. 3. Methocarbamol 500 mg 1 q.6 h. p.r.n. for muscle spasm, 90 tablets.  She has also been given a prescription, unsigned, for Colace.  She will continue on the other medications she was already taking at home.  She will be followed in the office in 1 month's time.  FINAL  DIAGNOSIS:  Lumbar stenosis/spinal stenosis, status post prior fusion.     Nelda Severe, MD     MT/MEDQ  D:  06/18/2011  T:  06/18/2011  Job:  [id number]  Electronically Signed by Nelda Severe MD on 06/25/2011 07:04:51 AM

## 2011-07-26 ENCOUNTER — Other Ambulatory Visit: Payer: Self-pay | Admitting: Family Medicine

## 2011-07-26 DIAGNOSIS — Z1231 Encounter for screening mammogram for malignant neoplasm of breast: Secondary | ICD-10-CM

## 2011-08-01 ENCOUNTER — Ambulatory Visit
Admission: RE | Admit: 2011-08-01 | Discharge: 2011-08-01 | Disposition: A | Discharge status: Home / Self Care | Payer: Medicare Other | Source: Ambulatory Visit | Attending: Family Medicine | Admitting: Family Medicine

## 2011-08-01 DIAGNOSIS — Z1231 Encounter for screening mammogram for malignant neoplasm of breast: Secondary | ICD-10-CM

## 2011-09-10 IMAGING — CT CT ABD-PELV W/ CM
3 of 5 series · 15 of 32 positions shown, 19 images · IV contrast (agent unspecified)
Comparison: 01/03/2010.

CLINICAL DATA: Mid right abdominal pain for several days.

CT ABDOMEN AND PELVIS WITH CONTRAST
TECHNIQUE: Multidetector CT imaging of the abdomen and pelvis was
performed following the standard protocol during bolus
administration of intravenous contrast.
Contrast: 100 ml Smnipaque-GGG.

[Series 2: routine abdomen · axial · 0.84mm/px · z∈[-386,-150]mm · 3 of 95 slices shown, 7 images]
[im 24/95  soft-tissue]
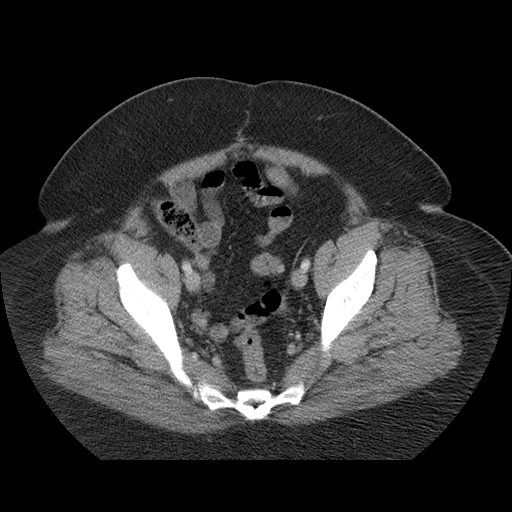
[im 24/95  lung]
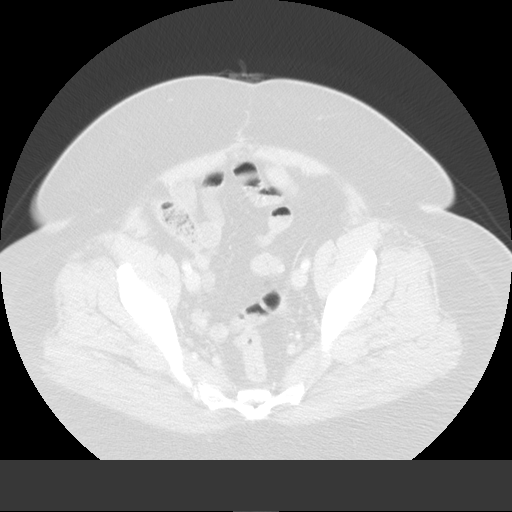
[im 24/95  bone]
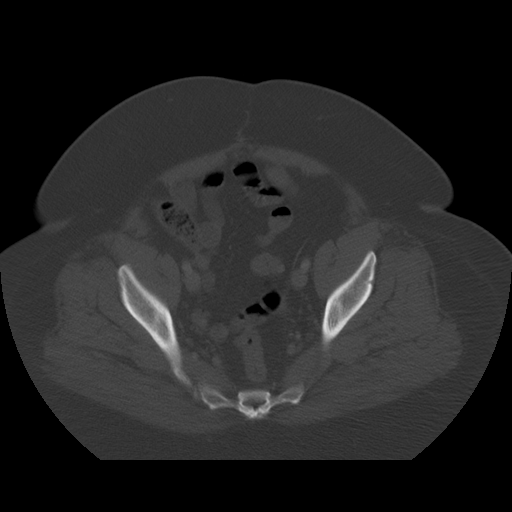
[im 48/95  soft-tissue]
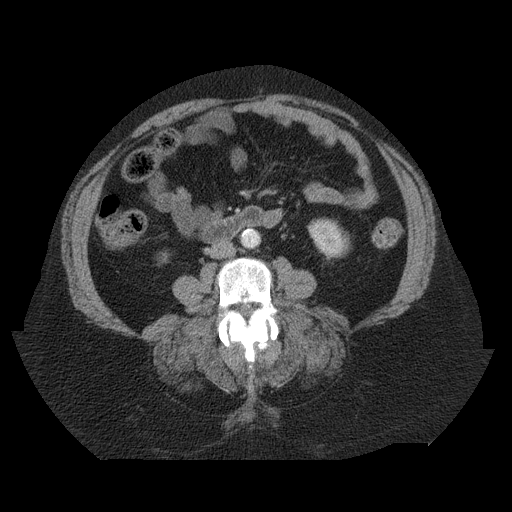
[im 48/95  lung]
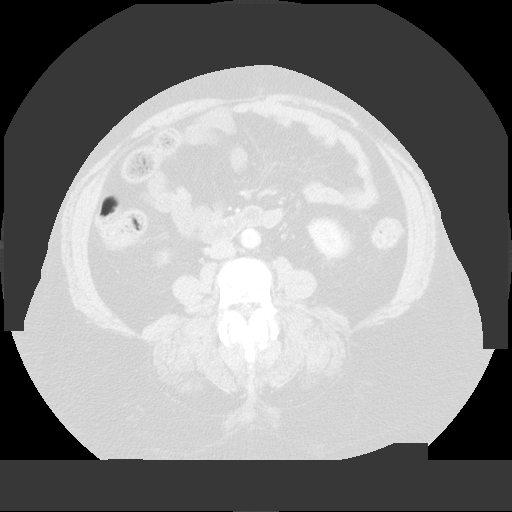
[im 71/95  soft-tissue]
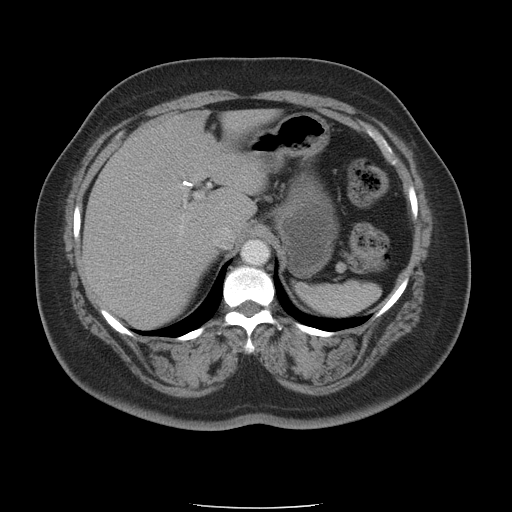
[im 71/95  lung]
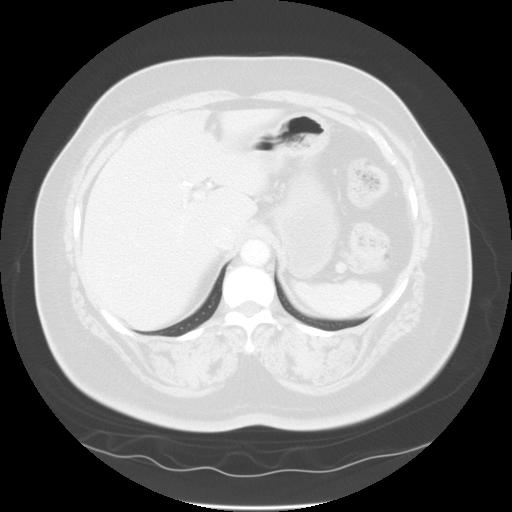

[Series 400: reformatted · coronal · 0.94mm/px · 4 of 193 slices shown (1 of 2)]
[im 20/193  soft-tissue]
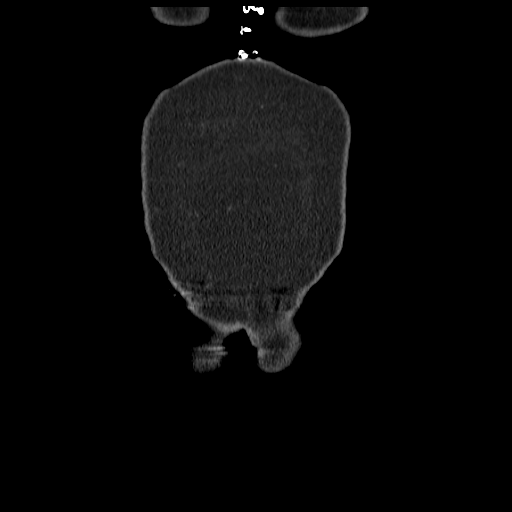
[im 39/193  soft-tissue]
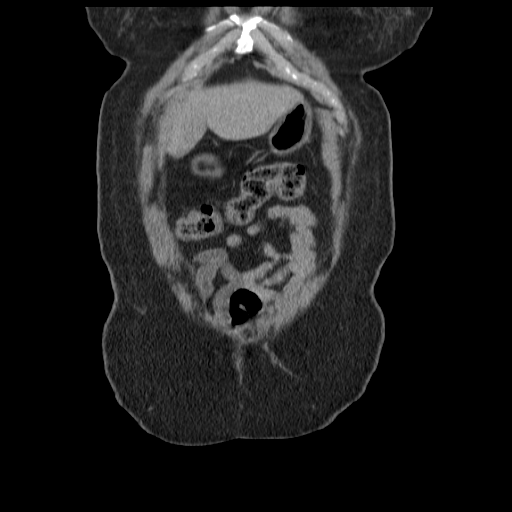
[im 58/193  soft-tissue]
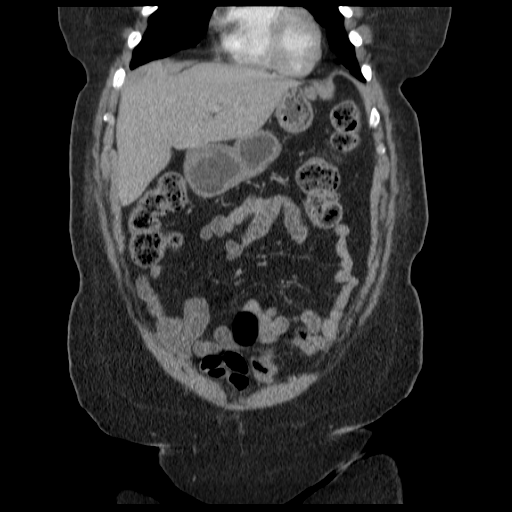
[im 77/193  soft-tissue]
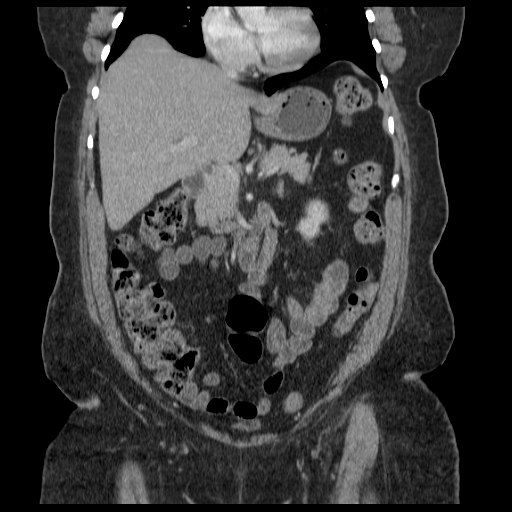

[Series 401: reformatted · sagittal · 0.94mm/px · 8 of 204 slices shown (2 of 2)]
[im 19/204  soft-tissue]
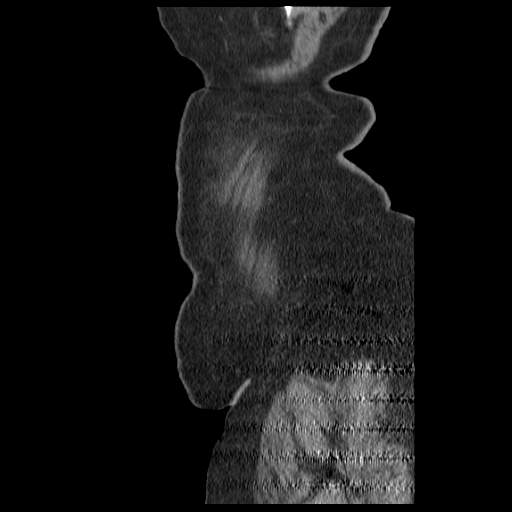
[im 37/204  soft-tissue]
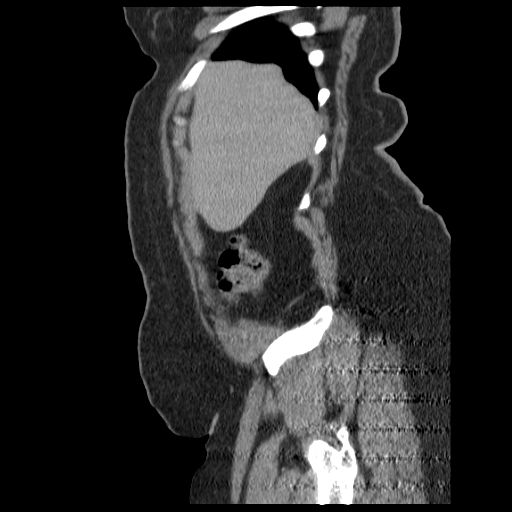
[im 74/204  soft-tissue]
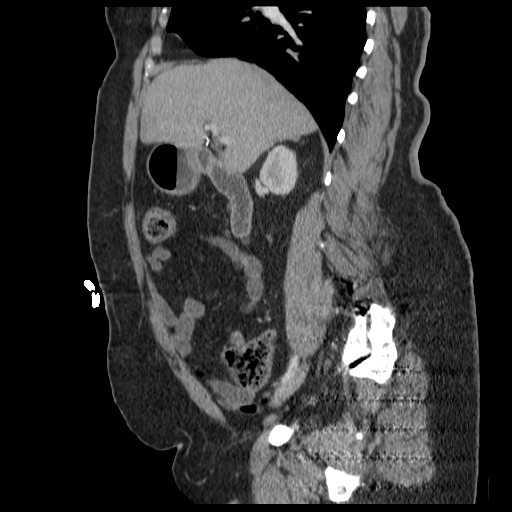
[im 93/204  soft-tissue]
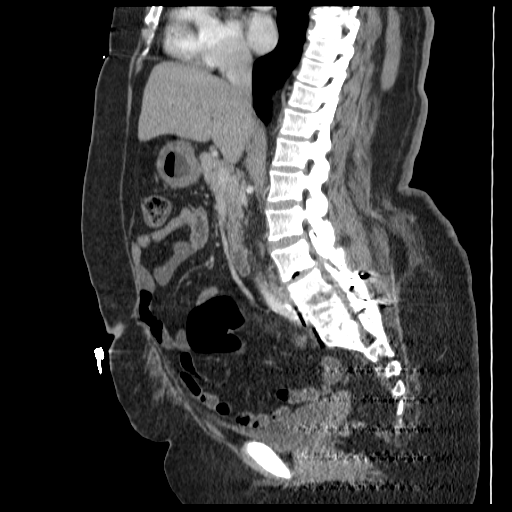
[im 111/204  soft-tissue]
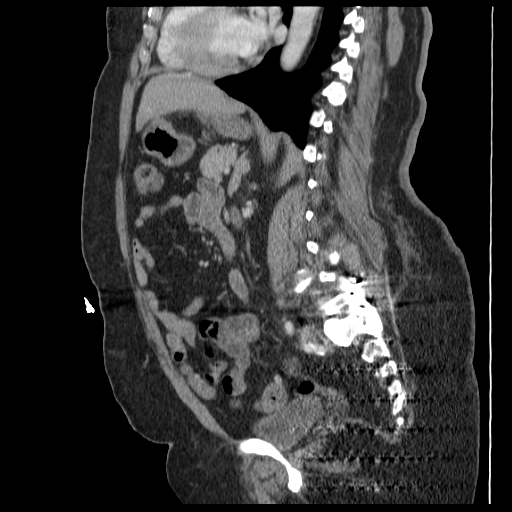
[im 130/204  soft-tissue]
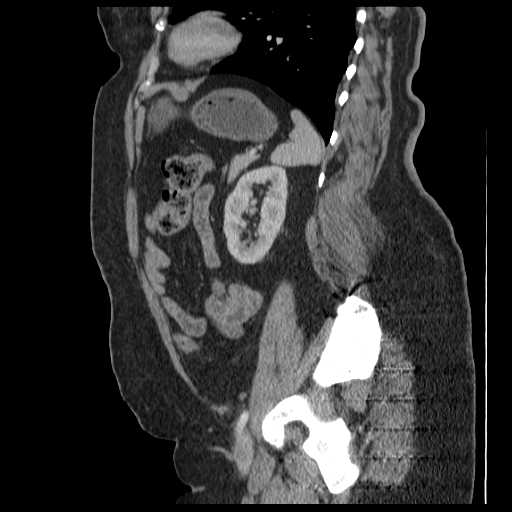
[im 167/204  soft-tissue]
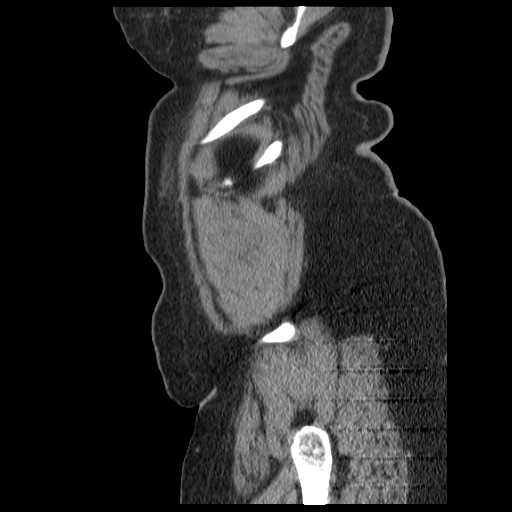
[im 185/204  soft-tissue]
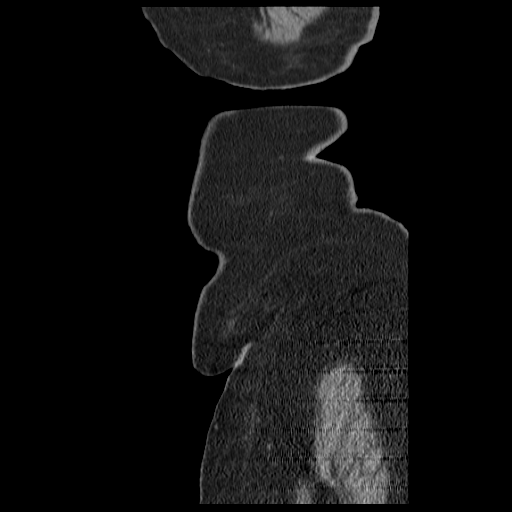

[15 of 32 positions shown; findings below may reference images not displayed]

FINDINGS: Lung bases clear.  No free intraperitoneal air.

Post cholecystectomy.  No focal liver, splenic, pancreatic, renal
or right adrenal lesion.  1.3 cm left adrenal lesion unchanged.
Etiology indeterminate.

Atherosclerotic type changes of the aorta without aneurysmal
dilation.  Atherosclerotic type changes iliac arteries.

Postsurgical changes lower lumbar spine.
IMPRESSION: No acute inflammatory process noted. Appendix not visualized.

1.3 cm left adrenal lesion unchanged.  Etiology indeterminate.

Please see above.

## 2011-09-26 DIAGNOSIS — H903 Sensorineural hearing loss, bilateral: Secondary | ICD-10-CM | POA: Diagnosis not present

## 2011-09-26 DIAGNOSIS — R42 Dizziness and giddiness: Secondary | ICD-10-CM | POA: Diagnosis not present

## 2011-09-26 DIAGNOSIS — H905 Unspecified sensorineural hearing loss: Secondary | ICD-10-CM | POA: Diagnosis not present

## 2011-10-04 IMAGING — MR MR LUMBAR SPINE W/O CM
4 of 5 series · 19 of 48 positions shown · non-contrast
Comparison: CT abdomen and pelvis 04/09/2011 and earlier.

CLINICAL DATA: 65-year-old female with low back pain radiating to
both lower extremities.  8665 spinal fusion.

MRI LUMBAR SPINE WITHOUT CONTRAST
TECHNIQUE: Multiplanar and multiecho pulse sequences of the lumbar
spine were obtained without intravenous contrast.

[Series 3: T2 · sagittal · 4.0mm · 0.55mm/px · 5 of 12 slices shown (1 of 2)]
[im 1/12]
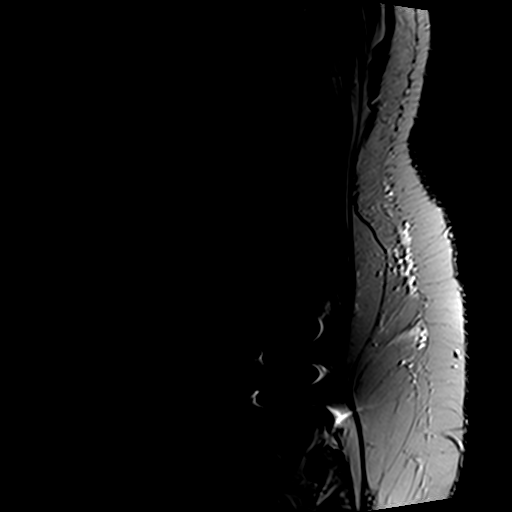
[im 3/12]
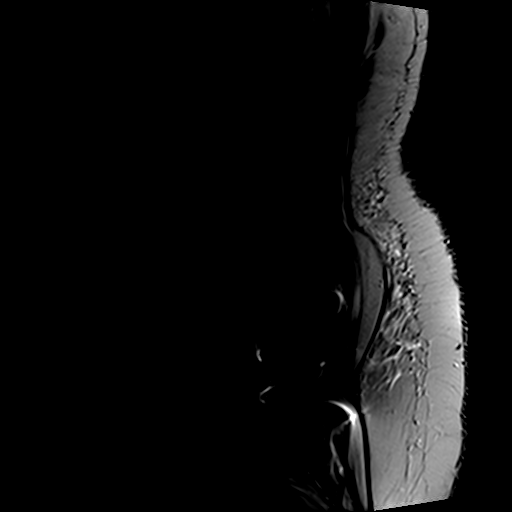
[im 6/12]
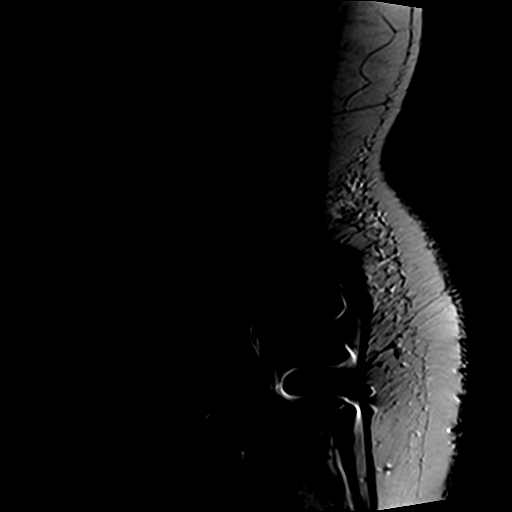
[im 9/12]
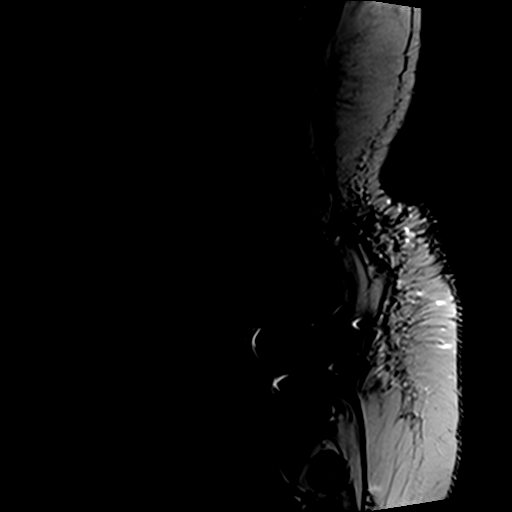
[im 12/12]
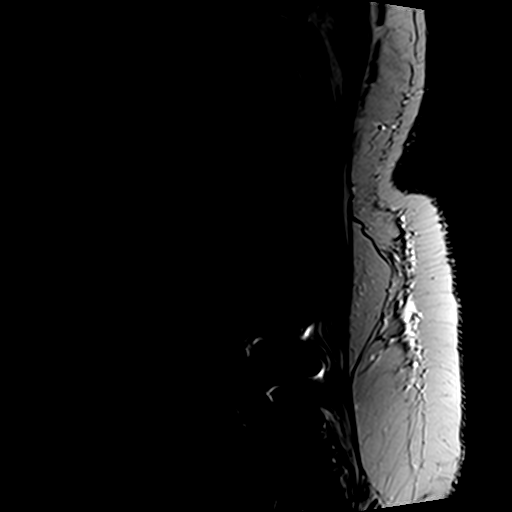

[Series 4: T1 · sagittal · 4.0mm · 0.55mm/px · 3 of 12 slices shown (1 of 2)]
[im 1/12]
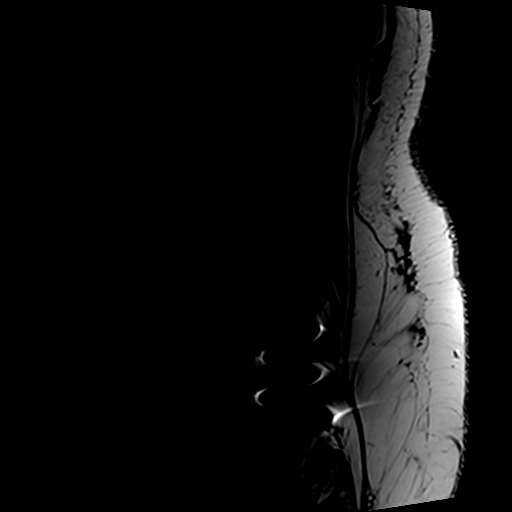
[im 6/12]
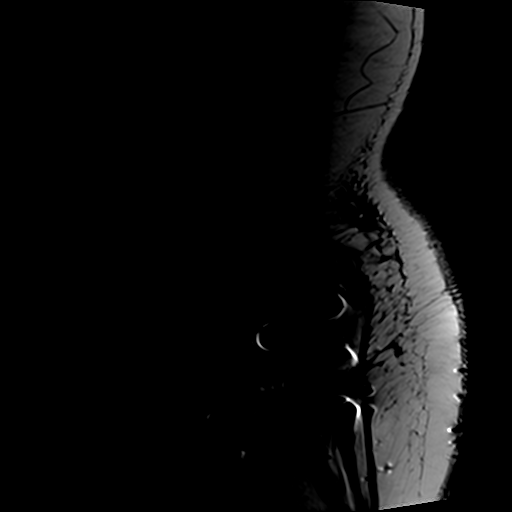
[im 12/12]
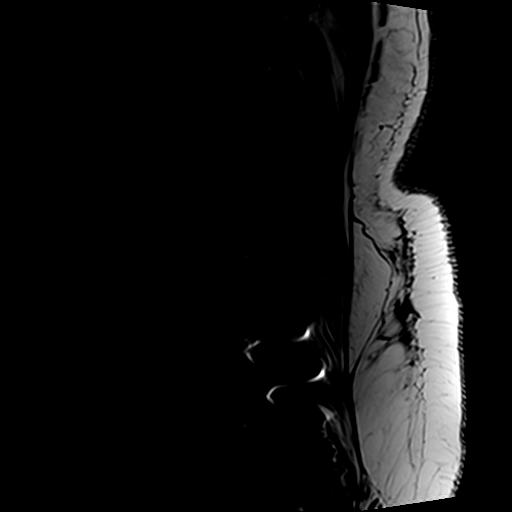

[Series 6: T2 · axial · 4.0mm · 0.33mm/px · z∈[-30,+125]mm · 8 of 34 slices shown (2 of 2)]
[im 3/34]
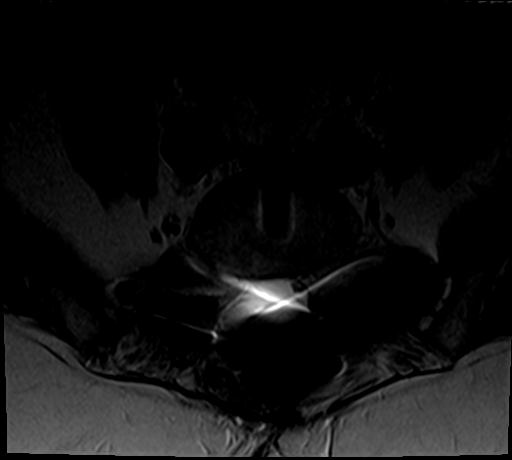
[im 5/34]
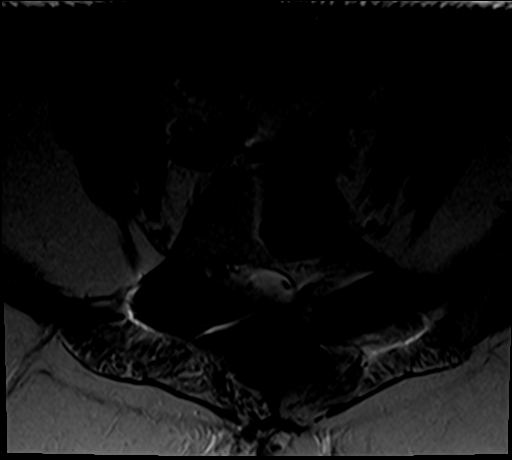
[im 7/34]
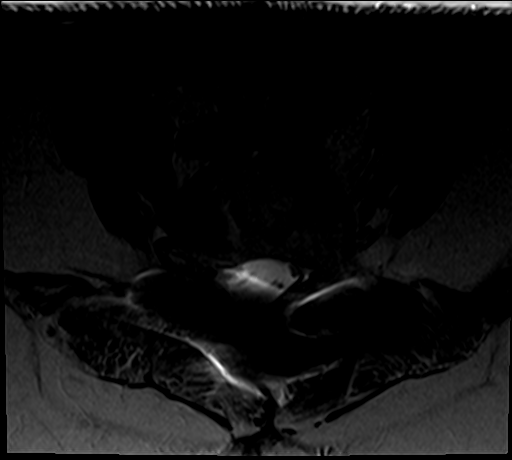
[im 12/34]
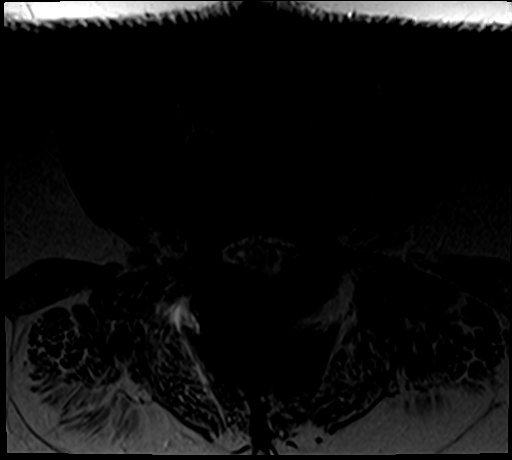
[im 16/34]
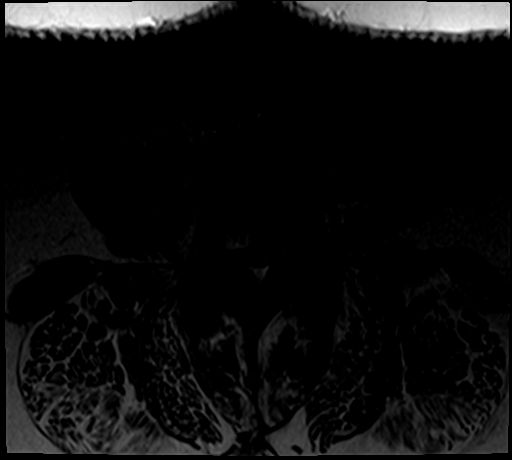
[im 18/34]
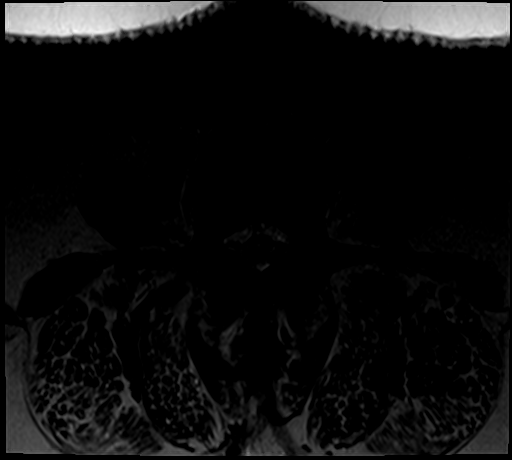
[im 20/34]
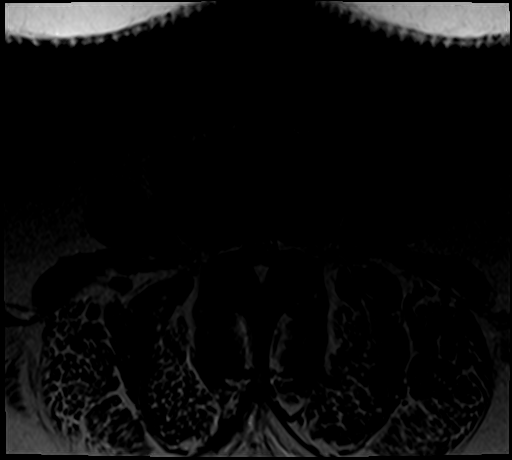
[im 29/34]
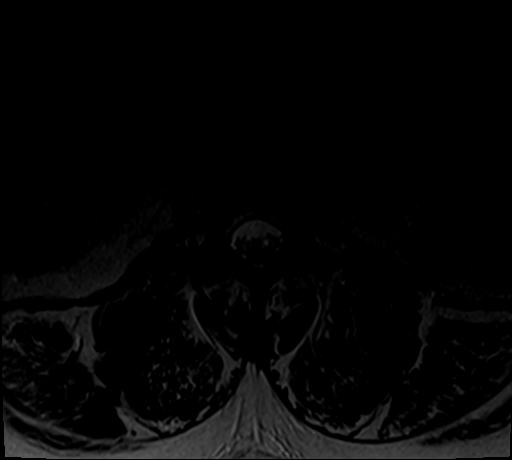

[Series 7: T1 · axial · 4.0mm · 0.33mm/px · z∈[-20,+125]mm · 3 of 34 slices shown (2 of 2)]
[im 5/34]
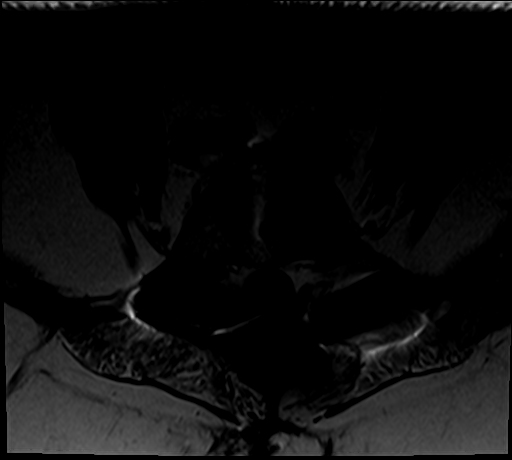
[im 18/34]
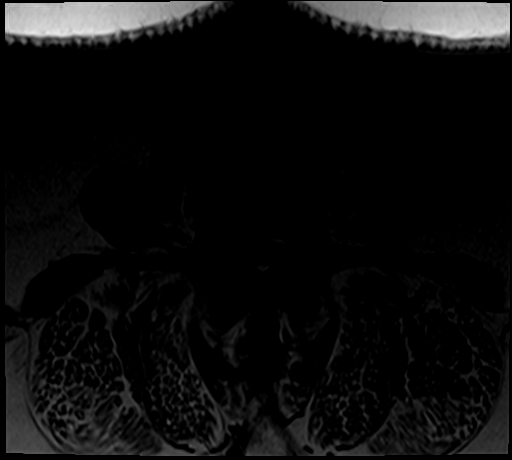
[im 29/34]
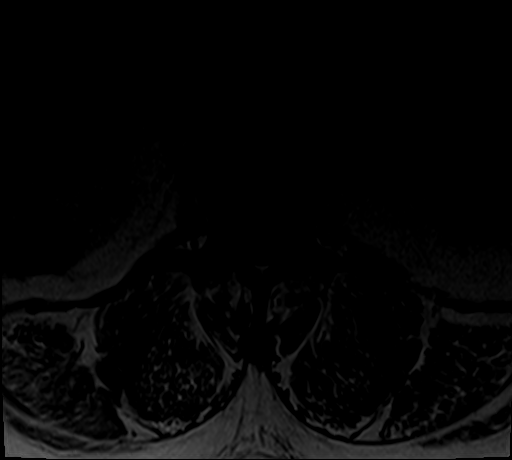

[19 of 48 positions shown; findings below may reference images not displayed]

FINDINGS: Negative visualized abdominal and pelvic viscera.
Visualized paraspinal soft tissues are within normal limits.
Significant metal susceptibility artifact at the L4-L5 and L5-S1
levels associated with anterior and posterior fusion hardware.
Solid interbody fusion suspected at both levels.  Stable vertebral
height and alignment otherwise. No marrow edema or evidence of
acute osseous abnormality.  Small benign vertebral body hemangioma
at T12.  Visualized lower thoracic spinal cord is normal with conus
medularis at T12-L1.

T10-T11:  Negative.

T11-T12:  Negative.

T12-L1:  Negative.

L1-L2:  Minor disc bulge.  Moderate facet hypertrophy.  Borderline
spinal stenosis.

L2-L3:  Mild broad-based disc protrusion with superimposed mild
circumferential bulge.  Moderate to severe facet and ligament
flavum hypertrophy.  Epidural lipomatosis.  Mild to moderate spinal
stenosis.  Mild bilateral L2 foraminal stenosis.

L3-L4:  Very severe facet degeneration.  Capacious facet joints
with fluid/high T2 signal.  Moderate to severe ligament flavum
hypertrophy.  Moderate broad-based disc protrusion superimposed on
circumferential disc bulge.  Combined there is severe spinal
stenosis (AP thecal sac 4-5 mm) with bilateral lateral recess
stenosis (greater on the left), left severe and right moderate L3
foraminal stenosis.

L4-L5:  Susceptibility artifact.  Fusion and decompression appears
grossly stable, no spinal stenosis suspected.

L5-S1:  Susceptibility artifact.  Fusion and decompression appears
grossly stable, no spinal stenosis suspected.
IMPRESSION: 1.  Adjacent segment disease at L3-L4 with severe spinal stenosis,
severe left lateral recess stenosis, and left greater than right L3
foraminal stenosis.
2.  Mild to moderate multifactorial spinal and foraminal stenosis
also at L2-L3.
3.  Fusion and decompression sequelae L4-L5 and L5-S1 partially
obscured by susceptibility artifact, but no adverse features are
evident.

## 2011-10-17 DIAGNOSIS — Z13820 Encounter for screening for osteoporosis: Secondary | ICD-10-CM | POA: Diagnosis not present

## 2011-10-17 DIAGNOSIS — N951 Menopausal and female climacteric states: Secondary | ICD-10-CM | POA: Diagnosis not present

## 2011-10-24 DIAGNOSIS — M25519 Pain in unspecified shoulder: Secondary | ICD-10-CM | POA: Diagnosis not present

## 2011-11-09 IMAGING — CR DG CHEST 2V
2 series · 2 of 2 positions shown · non-contrast
Comparison: 03/04/2009

CLINICAL DATA: Preop for lumbar spine surgery.  Dry cough.  Sternal
chest pain.  Shortness of breath.  .

CHEST - 2 VIEW

[view not recorded (1 of 2)]
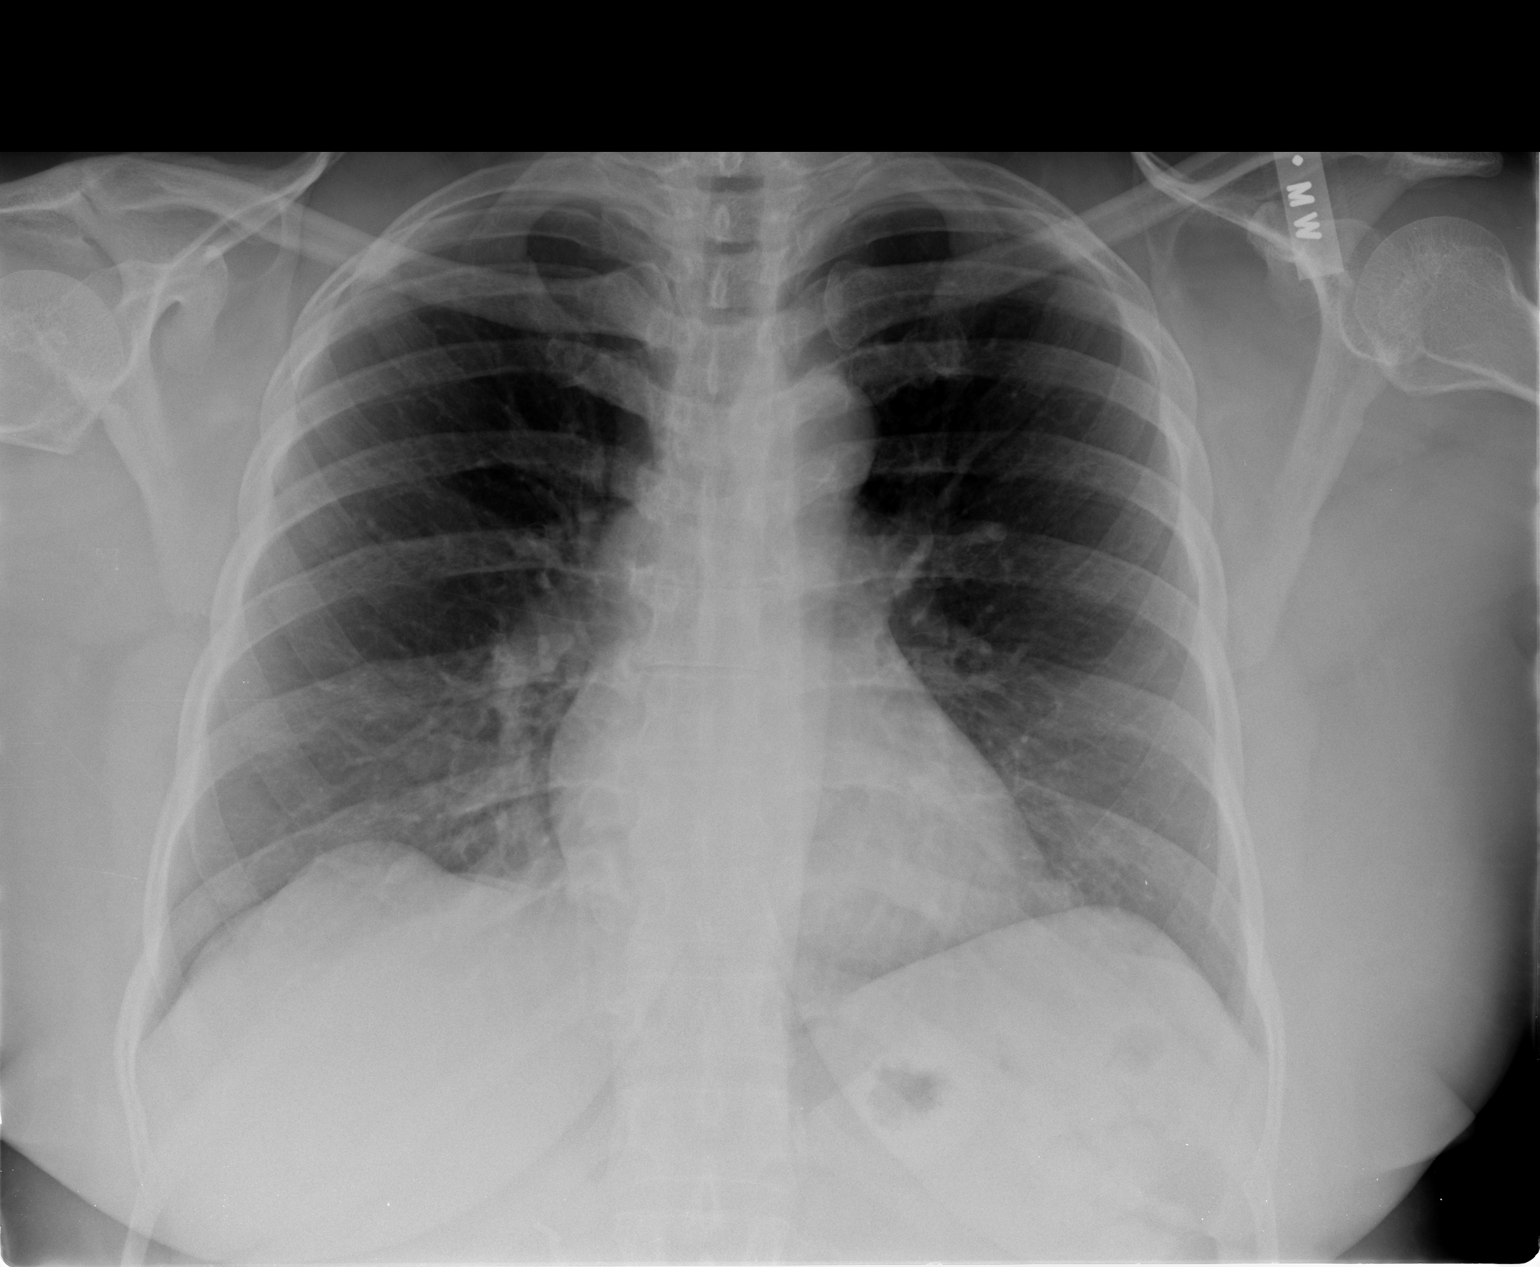

[view not recorded (2 of 2)]
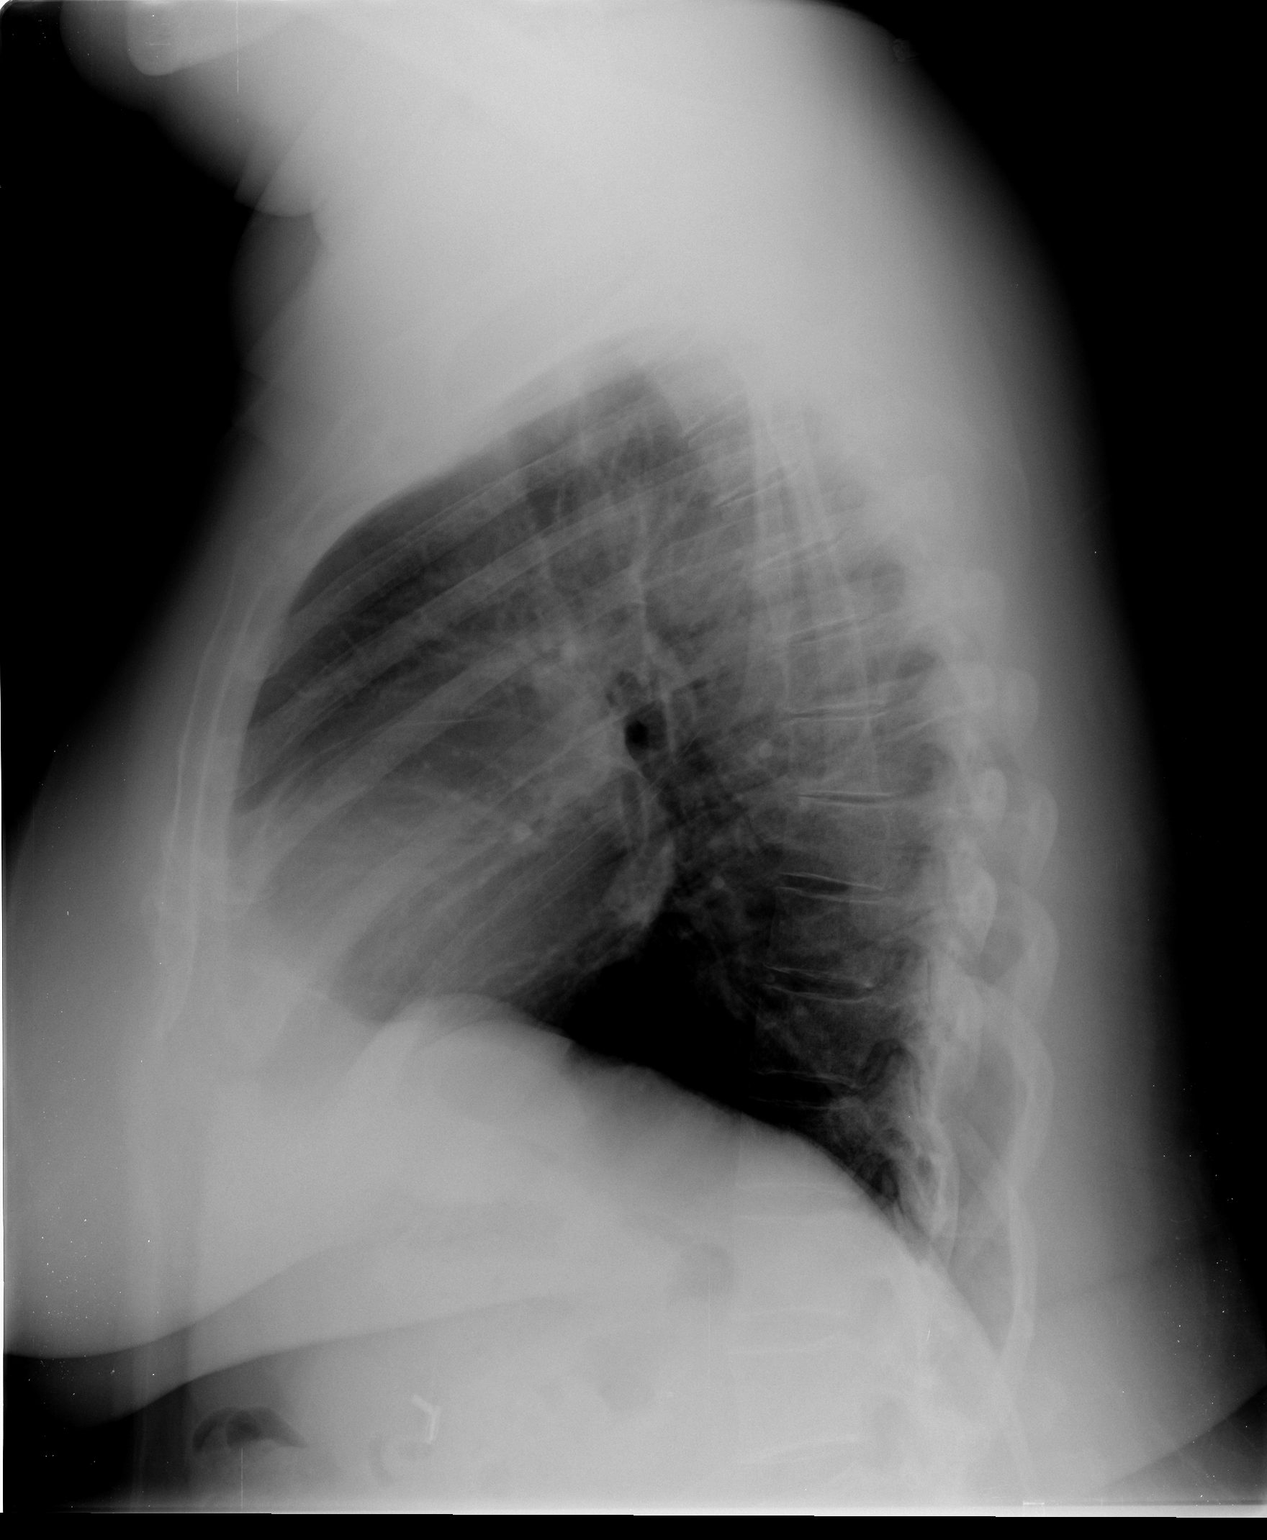

[2 of 2 positions shown; findings below may reference images not displayed]

FINDINGS: Midline trachea.  Normal heart size and mediastinal
contours. No pleural effusion or pneumothorax.  Mildly low lung
volumes. Clear lungs.
IMPRESSION: Low lung volumes without acute disease.

## 2011-11-13 IMAGING — RF DG LUMBAR SPINE 2-3V
1 series · 2 of 2 positions shown · non-contrast
Comparison: 05/03/2011

CLINICAL DATA: L2-3 and L3-4 fusion

LUMBAR SPINE - 2-3 VIEW

[Series 1: run · 2 of 2 slices shown]
[im 1/2]
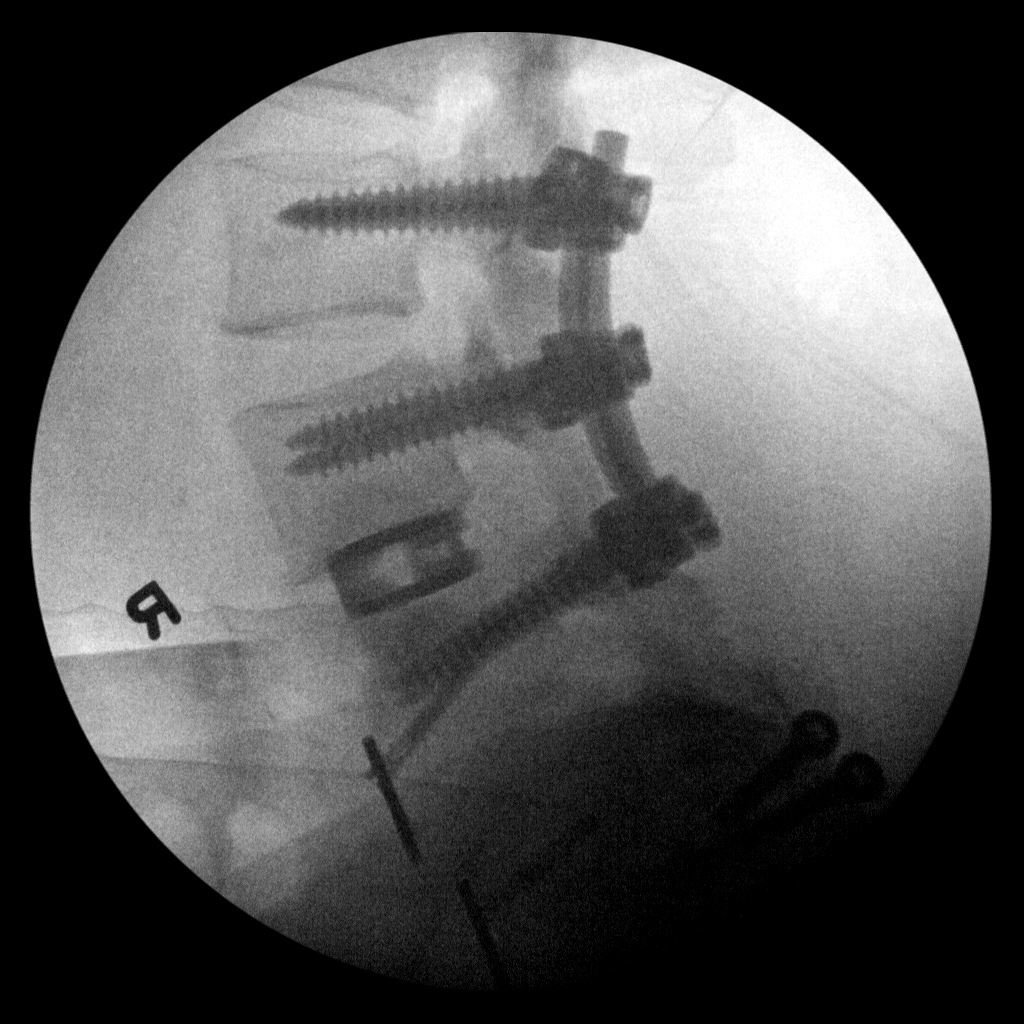
[im 2/2]
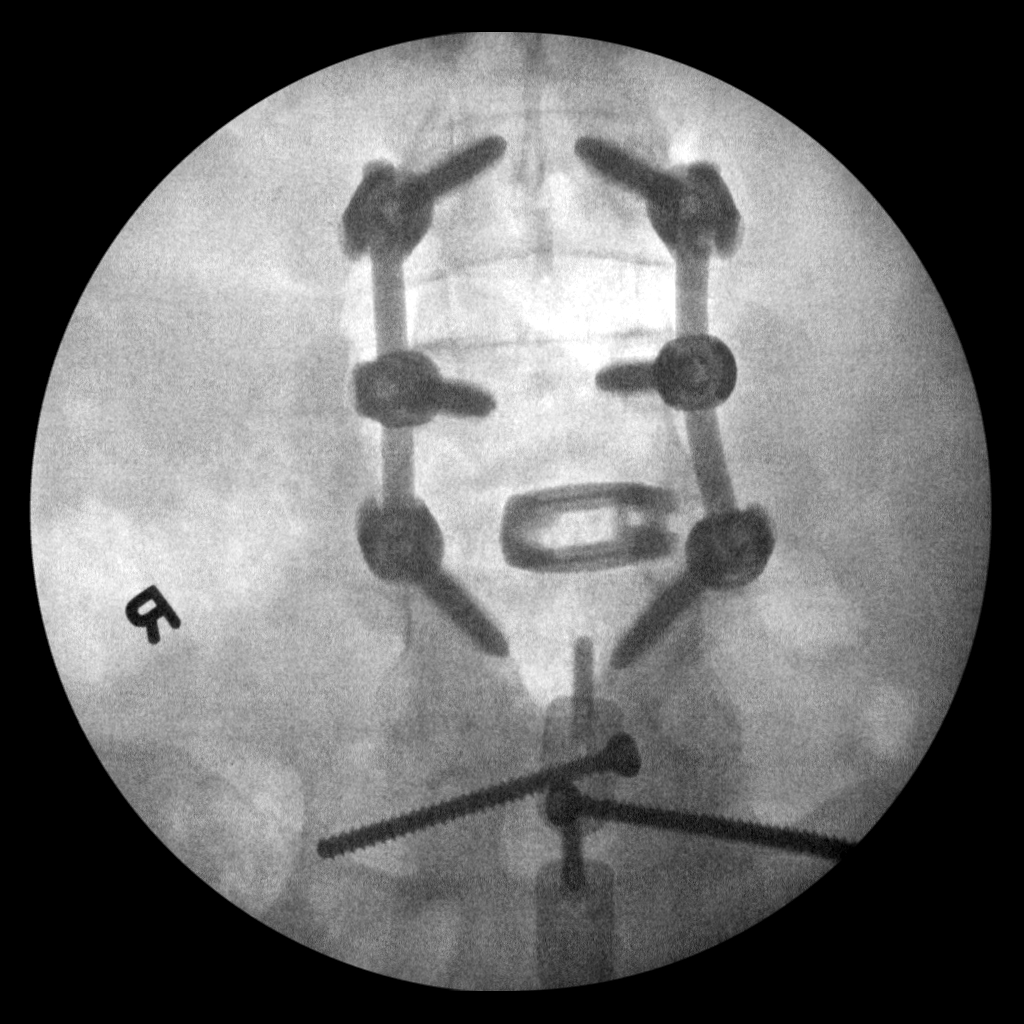

[2 of 2 positions shown; findings below may reference images not displayed]

FINDINGS: Bilateral pedicle screws have been placed and L2, L3, and
L4 with posterior cross stabilizing bars.  Disc spacer exists in
the L3-4 disc space.  L4-5 chronic fusion is noted with existing
hardware.  I believe that some of the screws and L4 have been
removed.  There is persistent minimal anterolisthesis L3 upon L4.
No vertebral height loss.  No compression deformity.  No breakage
or loosening of the hardware.
IMPRESSION: L2-3 and L3-4 fusion as described.

## 2011-12-18 DIAGNOSIS — M545 Low back pain, unspecified: Secondary | ICD-10-CM | POA: Diagnosis not present

## 2011-12-20 DIAGNOSIS — M545 Low back pain, unspecified: Secondary | ICD-10-CM | POA: Diagnosis not present

## 2012-01-04 ENCOUNTER — Other Ambulatory Visit: Payer: Self-pay | Admitting: Orthopedic Surgery

## 2012-01-04 DIAGNOSIS — M545 Low back pain, unspecified: Secondary | ICD-10-CM

## 2012-01-09 ENCOUNTER — Ambulatory Visit
Admission: RE | Admit: 2012-01-09 | Discharge: 2012-01-09 | Disposition: A | Discharge status: Home / Self Care | Payer: Medicare Other | Source: Ambulatory Visit | Attending: Orthopedic Surgery | Admitting: Orthopedic Surgery

## 2012-01-09 DIAGNOSIS — M545 Low back pain, unspecified: Secondary | ICD-10-CM

## 2012-01-09 DIAGNOSIS — Z4789 Encounter for other orthopedic aftercare: Secondary | ICD-10-CM | POA: Diagnosis not present

## 2012-01-15 DIAGNOSIS — M545 Low back pain, unspecified: Secondary | ICD-10-CM | POA: Diagnosis not present

## 2012-01-16 DIAGNOSIS — M545 Low back pain, unspecified: Secondary | ICD-10-CM | POA: Diagnosis not present

## 2012-01-16 DIAGNOSIS — Z981 Arthrodesis status: Secondary | ICD-10-CM | POA: Diagnosis not present

## 2012-02-14 DIAGNOSIS — M545 Low back pain, unspecified: Secondary | ICD-10-CM | POA: Diagnosis not present

## 2012-03-12 DIAGNOSIS — R5381 Other malaise: Secondary | ICD-10-CM | POA: Diagnosis not present

## 2012-03-12 DIAGNOSIS — IMO0001 Reserved for inherently not codable concepts without codable children: Secondary | ICD-10-CM | POA: Diagnosis not present

## 2012-03-12 DIAGNOSIS — Z79899 Other long term (current) drug therapy: Secondary | ICD-10-CM | POA: Diagnosis not present

## 2012-03-12 DIAGNOSIS — M545 Low back pain, unspecified: Secondary | ICD-10-CM | POA: Diagnosis not present

## 2012-03-12 DIAGNOSIS — R5383 Other fatigue: Secondary | ICD-10-CM | POA: Diagnosis not present

## 2012-03-12 DIAGNOSIS — M2550 Pain in unspecified joint: Secondary | ICD-10-CM | POA: Diagnosis not present

## 2012-03-12 DIAGNOSIS — M1990 Unspecified osteoarthritis, unspecified site: Secondary | ICD-10-CM | POA: Diagnosis not present

## 2012-03-20 DIAGNOSIS — M5137 Other intervertebral disc degeneration, lumbosacral region: Secondary | ICD-10-CM | POA: Diagnosis not present

## 2012-03-20 DIAGNOSIS — M48061 Spinal stenosis, lumbar region without neurogenic claudication: Secondary | ICD-10-CM | POA: Diagnosis not present

## 2012-03-20 DIAGNOSIS — M47817 Spondylosis without myelopathy or radiculopathy, lumbosacral region: Secondary | ICD-10-CM | POA: Diagnosis not present

## 2012-04-01 DIAGNOSIS — L708 Other acne: Secondary | ICD-10-CM | POA: Diagnosis not present

## 2012-04-01 DIAGNOSIS — L259 Unspecified contact dermatitis, unspecified cause: Secondary | ICD-10-CM | POA: Diagnosis not present

## 2012-04-01 DIAGNOSIS — L819 Disorder of pigmentation, unspecified: Secondary | ICD-10-CM | POA: Diagnosis not present

## 2012-04-16 DIAGNOSIS — M48061 Spinal stenosis, lumbar region without neurogenic claudication: Secondary | ICD-10-CM | POA: Diagnosis not present

## 2012-04-16 DIAGNOSIS — IMO0001 Reserved for inherently not codable concepts without codable children: Secondary | ICD-10-CM | POA: Diagnosis not present

## 2012-04-16 DIAGNOSIS — I10 Essential (primary) hypertension: Secondary | ICD-10-CM | POA: Diagnosis not present

## 2012-04-16 DIAGNOSIS — R55 Syncope and collapse: Secondary | ICD-10-CM | POA: Diagnosis not present

## 2012-05-13 DIAGNOSIS — R3911 Hesitancy of micturition: Secondary | ICD-10-CM | POA: Diagnosis not present

## 2012-05-13 DIAGNOSIS — R319 Hematuria, unspecified: Secondary | ICD-10-CM | POA: Diagnosis not present

## 2012-06-11 IMAGING — CT CT L SPINE W/O CM
4 of 9 series · 10 of 33 positions shown, 12 images · non-contrast
Comparison: MRI lumbar spine most recent 05/03/2011.
Intraoperative films 06/12/2011.

CLINICAL DATA: Low back pain.  Status post lumbar fusion.

CT LUMBAR SPINE WITHOUT CONTRAST
TECHNIQUE: Multidetector CT imaging of the lumbar spine was
performed without intravenous contrast administration. Multiplanar
CT image reconstructions were also generated.

[Series 4: l spine bone · axial · 0.27mm/px · z∈[-191,-111]mm · 2 of 96 slices shown, 3 images]
[im 32/96  soft-tissue]
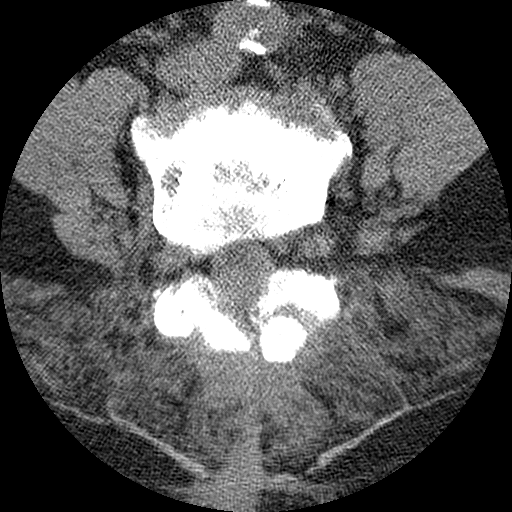
[im 32/96  bone]
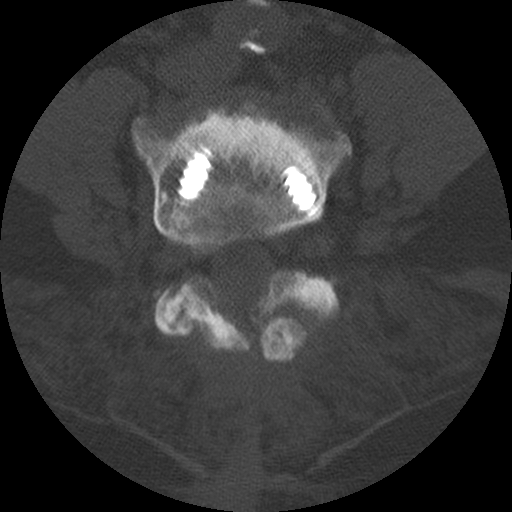
[im 64/96  bone]
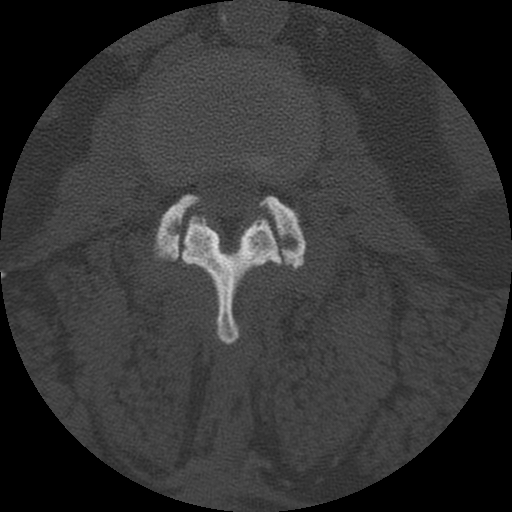

[Series 5: l-spine detail · axial · 0.27mm/px · z∈[-191,-111]mm · 2 of 96 slices shown]
[im 32/96  bone]
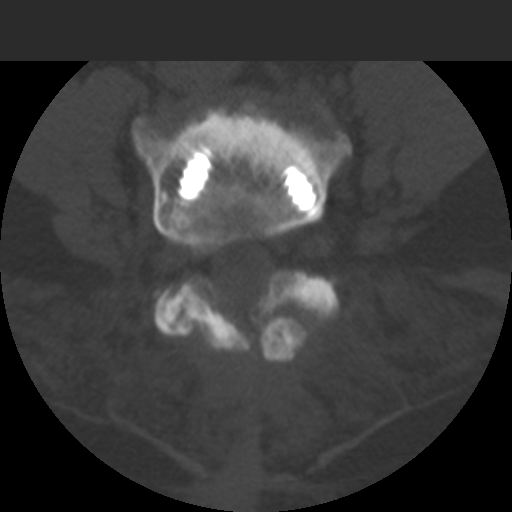
[im 64/96  bone]
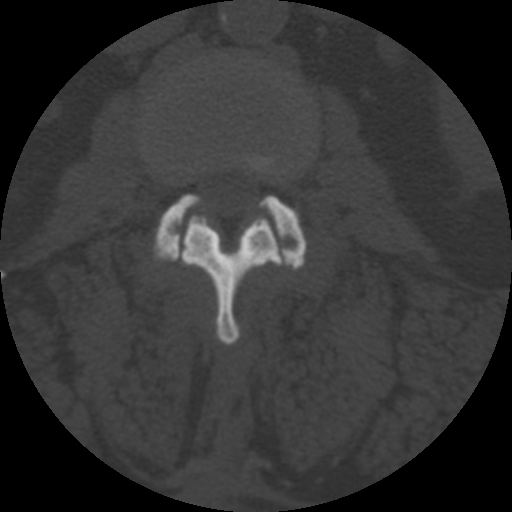

[Series 201: cor lower · coronal · 0.48mm/px · 1 of 56 slices shown]
[im 28/56  bone]
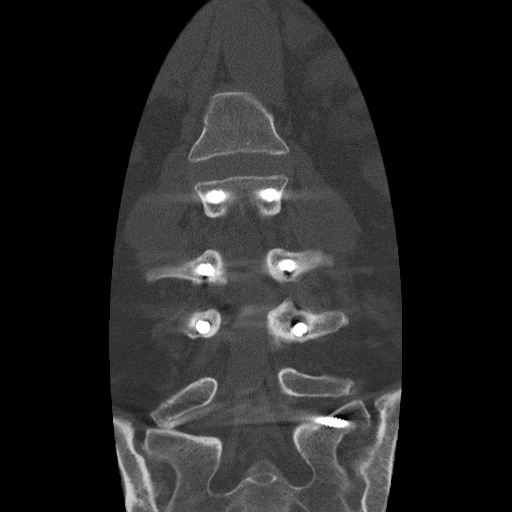

[Series 202: sag · sagittal · 0.48mm/px · 5 of 51 slices shown, 6 images]
[im 17/51  bone]
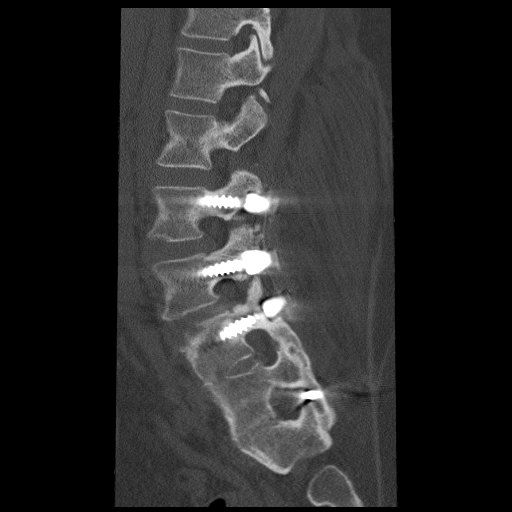
[im 21/51  bone]
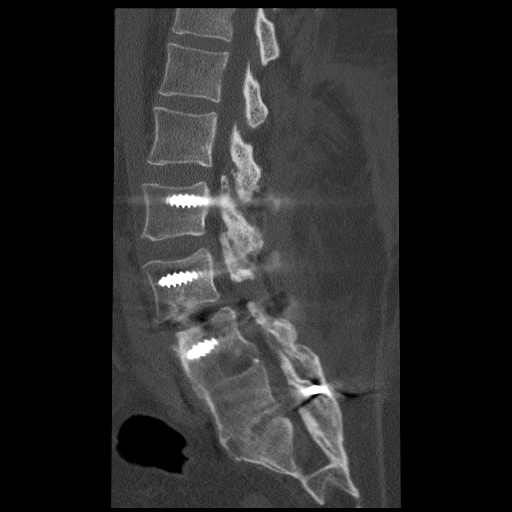
[im 26/51  soft-tissue]
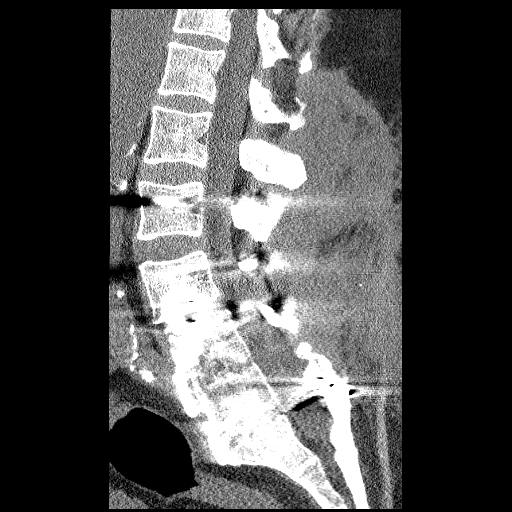
[im 26/51  bone]
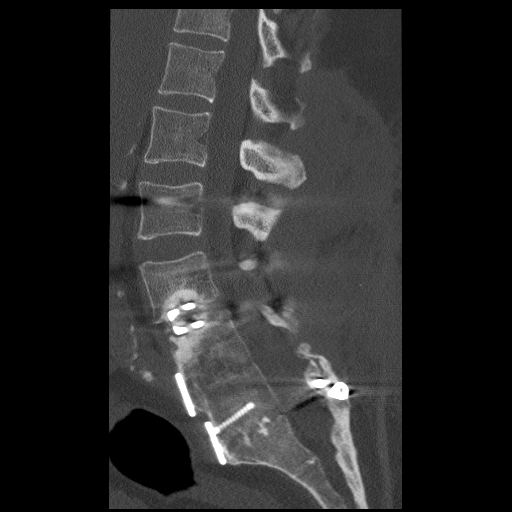
[im 30/51  bone]
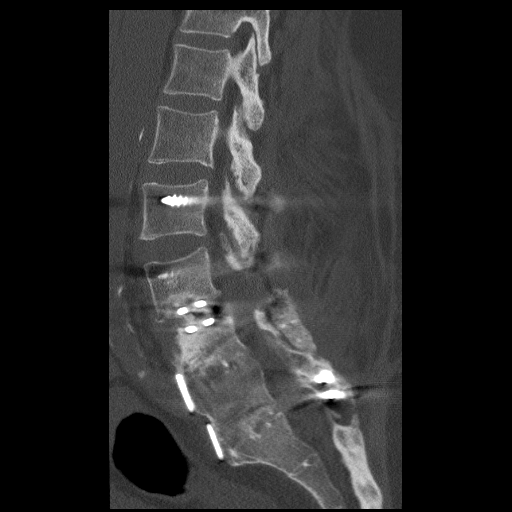
[im 34/51  bone]
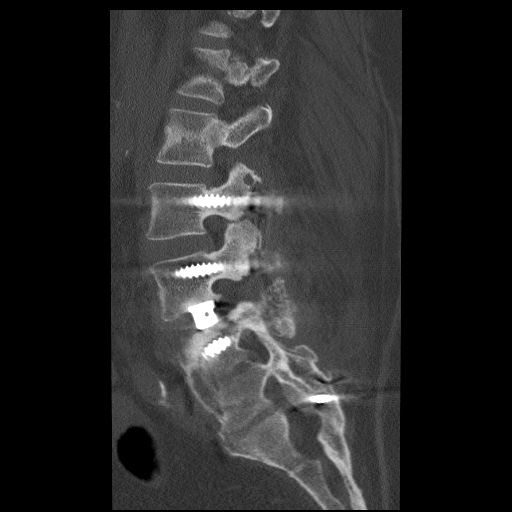

[10 of 33 positions shown; findings below may reference images not displayed]

FINDINGS: The the patient is most recently (06/12/2011) status post
posterolateral fusion at L2-3 and L3-4, augmented with interbody
cage at L3-L4. Previously the patient is status post ALIF from L4
through S1.  The L4-S1 fusion is solid and hardware is intact.

There is slight anterolisthesis of 1 mm L3 forward on L4.
Otherwise the alignment is anatomic.  The pedicle screws at L2, L3,
and L4 intact, but loosening is noted around the left L4 screw.

The individual disc spaces are examined as follows:

L1-2:  Mild to moderate facet arthropathy.  No disc protrusion.  No
neural compression.

L2-3:  Moderately advanced facet arthropathy and ligamentum flavum
hypertrophy slightly worse on the right.  Central bulging annular
fibers.  Developing posterolateral fusion with bony bridging across
the dorsal aspect of the facet joints (see image 45 of series 4).
There remains however mild central canal stenosis and potentially
lateral recess encroachment along with mild bilateral foraminal
narrowing. There is possible right L2 and right L3 nerve root
irritation.

L3-4:  Lucency and vacuum phenomenon surround the Alan Manuel interbody
cage indicating nonunion.  There is no cage migration.  Moderate
sclerosis of the endplates above and below L3-L4.  1 mm
anterolisthesis.  No solid posterolateral fusion.  Wide
decompression of the spinal canal.

L4-5:  Solid posterolateral and interbody fusion.  No residual
neural compression.

L5-S1:  Solid posterolateral and interbody fusion.  No residual
neural compression.
IMPRESSION: Solid ALIF L4-S1.  More recently, L2-L4 posterolateral fusion with
interbody cage placed at L3-4.

Nonunion L3-4 with lucency surrounding the cage and left L4 screw.

Developing posterolateral fusion L2-L3.

Moderate right greater than left facet arthropathy and ligamentum
flavum hypertrophy along with central bulging annular fibers at L2-
L3, potentially affecting the right L2 and/or right L3 nerve roots.
See comments above.

## 2012-07-01 DIAGNOSIS — M5137 Other intervertebral disc degeneration, lumbosacral region: Secondary | ICD-10-CM | POA: Diagnosis not present

## 2012-07-10 ENCOUNTER — Other Ambulatory Visit: Payer: Self-pay | Admitting: Family Medicine

## 2012-07-10 DIAGNOSIS — Z1231 Encounter for screening mammogram for malignant neoplasm of breast: Secondary | ICD-10-CM

## 2012-08-08 ENCOUNTER — Ambulatory Visit
Admission: RE | Admit: 2012-08-08 | Discharge: 2012-08-08 | Disposition: A | Discharge status: Home / Self Care | Payer: Medicare Other | Source: Ambulatory Visit | Attending: Family Medicine | Admitting: Family Medicine

## 2012-08-08 DIAGNOSIS — Z1231 Encounter for screening mammogram for malignant neoplasm of breast: Secondary | ICD-10-CM | POA: Diagnosis not present

## 2012-08-27 DIAGNOSIS — H524 Presbyopia: Secondary | ICD-10-CM | POA: Diagnosis not present

## 2012-08-27 DIAGNOSIS — H2510 Age-related nuclear cataract, unspecified eye: Secondary | ICD-10-CM | POA: Diagnosis not present

## 2012-08-28 ENCOUNTER — Other Ambulatory Visit: Payer: Self-pay | Admitting: Neurosurgery

## 2012-08-28 DIAGNOSIS — M5410 Radiculopathy, site unspecified: Secondary | ICD-10-CM

## 2012-09-03 ENCOUNTER — Ambulatory Visit
Admission: RE | Admit: 2012-09-03 | Discharge: 2012-09-03 | Disposition: A | Discharge status: Home / Self Care | Payer: Medicare Other | Source: Ambulatory Visit | Attending: Neurosurgery | Admitting: Neurosurgery

## 2012-09-03 DIAGNOSIS — M5126 Other intervertebral disc displacement, lumbar region: Secondary | ICD-10-CM | POA: Diagnosis not present

## 2012-09-03 DIAGNOSIS — IMO0002 Reserved for concepts with insufficient information to code with codable children: Secondary | ICD-10-CM | POA: Diagnosis not present

## 2012-09-03 DIAGNOSIS — M5410 Radiculopathy, site unspecified: Secondary | ICD-10-CM

## 2012-09-10 DIAGNOSIS — M2550 Pain in unspecified joint: Secondary | ICD-10-CM | POA: Diagnosis not present

## 2012-09-10 DIAGNOSIS — IMO0001 Reserved for inherently not codable concepts without codable children: Secondary | ICD-10-CM | POA: Diagnosis not present

## 2012-09-10 DIAGNOSIS — R894 Abnormal immunological findings in specimens from other organs, systems and tissues: Secondary | ICD-10-CM | POA: Diagnosis not present

## 2012-09-10 DIAGNOSIS — Z79899 Other long term (current) drug therapy: Secondary | ICD-10-CM | POA: Diagnosis not present

## 2012-09-10 DIAGNOSIS — M545 Low back pain, unspecified: Secondary | ICD-10-CM | POA: Diagnosis not present

## 2012-09-10 DIAGNOSIS — M1990 Unspecified osteoarthritis, unspecified site: Secondary | ICD-10-CM | POA: Diagnosis not present

## 2012-09-24 HISTORY — PX: LUMBAR FUSION: SHX111

## 2012-10-06 DIAGNOSIS — J209 Acute bronchitis, unspecified: Secondary | ICD-10-CM | POA: Diagnosis not present

## 2012-11-03 DIAGNOSIS — R109 Unspecified abdominal pain: Secondary | ICD-10-CM | POA: Diagnosis not present

## 2012-11-03 DIAGNOSIS — K589 Irritable bowel syndrome without diarrhea: Secondary | ICD-10-CM | POA: Diagnosis not present

## 2012-11-03 DIAGNOSIS — I10 Essential (primary) hypertension: Secondary | ICD-10-CM | POA: Diagnosis not present

## 2012-11-03 DIAGNOSIS — R002 Palpitations: Secondary | ICD-10-CM | POA: Diagnosis not present

## 2012-11-04 DIAGNOSIS — M48061 Spinal stenosis, lumbar region without neurogenic claudication: Secondary | ICD-10-CM | POA: Diagnosis not present

## 2012-11-04 DIAGNOSIS — M5137 Other intervertebral disc degeneration, lumbosacral region: Secondary | ICD-10-CM | POA: Diagnosis not present

## 2012-11-04 DIAGNOSIS — M5126 Other intervertebral disc displacement, lumbar region: Secondary | ICD-10-CM | POA: Diagnosis not present

## 2012-11-26 DIAGNOSIS — M5137 Other intervertebral disc degeneration, lumbosacral region: Secondary | ICD-10-CM | POA: Diagnosis not present

## 2012-11-26 DIAGNOSIS — M545 Low back pain, unspecified: Secondary | ICD-10-CM | POA: Diagnosis not present

## 2012-11-26 DIAGNOSIS — IMO0002 Reserved for concepts with insufficient information to code with codable children: Secondary | ICD-10-CM | POA: Diagnosis not present

## 2012-12-30 DIAGNOSIS — K589 Irritable bowel syndrome without diarrhea: Secondary | ICD-10-CM | POA: Diagnosis not present

## 2012-12-31 DIAGNOSIS — M5137 Other intervertebral disc degeneration, lumbosacral region: Secondary | ICD-10-CM | POA: Diagnosis not present

## 2012-12-31 DIAGNOSIS — R51 Headache: Secondary | ICD-10-CM | POA: Diagnosis not present

## 2013-01-20 DIAGNOSIS — M961 Postlaminectomy syndrome, not elsewhere classified: Secondary | ICD-10-CM | POA: Diagnosis not present

## 2013-01-20 DIAGNOSIS — M545 Low back pain, unspecified: Secondary | ICD-10-CM | POA: Diagnosis not present

## 2013-01-21 DIAGNOSIS — K219 Gastro-esophageal reflux disease without esophagitis: Secondary | ICD-10-CM | POA: Diagnosis not present

## 2013-01-21 DIAGNOSIS — B079 Viral wart, unspecified: Secondary | ICD-10-CM | POA: Diagnosis not present

## 2013-01-21 DIAGNOSIS — D179 Benign lipomatous neoplasm, unspecified: Secondary | ICD-10-CM | POA: Diagnosis not present

## 2013-02-03 ENCOUNTER — Ambulatory Visit (INDEPENDENT_AMBULATORY_CARE_PROVIDER_SITE_OTHER): Payer: Medicare Other | Admitting: Surgery

## 2013-02-03 ENCOUNTER — Encounter (INDEPENDENT_AMBULATORY_CARE_PROVIDER_SITE_OTHER): Payer: Self-pay | Admitting: Surgery

## 2013-02-03 VITALS — BP 162/84 | HR 76 | Temp 98.3°F | Resp 16 | Ht 65.0 in | Wt 265.0 lb

## 2013-02-03 DIAGNOSIS — R2242 Localized swelling, mass and lump, left lower limb: Secondary | ICD-10-CM

## 2013-02-03 DIAGNOSIS — D171 Benign lipomatous neoplasm of skin and subcutaneous tissue of trunk: Secondary | ICD-10-CM | POA: Insufficient documentation

## 2013-02-03 DIAGNOSIS — R229 Localized swelling, mass and lump, unspecified: Secondary | ICD-10-CM | POA: Diagnosis not present

## 2013-02-03 NOTE — Progress Notes (Signed)
Subjective:     Patient ID: Amy [name], female   DOB: 10/20/1945, 67 y.o.   MRN: [id number]  HPI  Amy [name]  [birth year] [id number]  Patient Care Team: Maurice Small, MD as PCP - General (Family Medicine)  This patient is a 67 y.o.female who presents today for surgical evaluation at the request of Dr. Valentina Lucks   Reason for visit: Enlarging mass on left buttock.  Pleasant morbidly obese female.  Has noticed a lump on her left buttock for decades.  More recently and is become larger.  More sensitive.  Her aunt and mother had similar masses.  One sounds like a lipoma.  The other one she thought might of been a cancer.  She was concerned given the recent change.  She mention it to her primary care physician.  Dr. Valentina Lucks sent the patient to me for evaluation.   No exertional chest/neck/shoulder/arm pain.  Patient can walk 20 minutes for about 1/2 miles without difficulty.   No history of neurofibromatosis.  No history of skin infections nor MRSA.  No other subcutaneous/skin masses in her personal history.  No fall or trauma   Patient Active Problem List   Diagnosis Date Noted  . Obesity, Class III, BMI 40-49.9 (morbid obesity) 02/03/2013  . Mass of left posterior buttock, SQ, 6x6x6cm 02/03/2013  . DYSPNEA 01/23/2008  . COUGH 01/23/2008  . OBSTRUCTIVE SLEEP APNEA 09/12/2007  . HYPERTENSION 09/12/2007  . ALLERGIC RHINITIS 09/12/2007  . SLEEPINESS 09/12/2007  . ANGINA, HX OF 09/12/2007    Past Medical History  Diagnosis Date  . Anxiety     Past Surgical History  Procedure Laterality Date  . Abdominal hysterectomy    . Spinal fusion    . Nose surgery    . Bone graft hip iliac crest Right     for lumbar spinal surgery/fusion    History   Social History  . Marital Status: Married    Spouse Name: N/A    Number of Children: N/A  . Years of Education: N/A   Occupational History  . Not on file.   Social History Main Topics  . Smoking status:  Former Games developer  . Smokeless tobacco: Never Used  . Alcohol Use: No  . Drug Use: No  . Sexually Active: Not on file   Other Topics Concern  . Not on file   Social History Narrative  . No narrative on file    Family History  Problem Relation Age of Onset  . Hypertension Mother   . Cancer Mother     cervix  . Kidney disease Mother   . Hypertension Sister   . Cancer Sister     polyps  . Kidney disease Brother   . Cancer Daughter     leukemia  . Hypertension Brother     Current Outpatient Prescriptions  Medication Sig Dispense Refill  . CELEBREX 200 MG capsule       . Cholecalciferol (VITAMIN D PO) Take by mouth.      . felodipine (PLENDIL) 10 MG 24 hr tablet       . glycopyrrolate (ROBINUL) 2 MG tablet       . imiquimod (ALDARA) 5 % cream       . losartan-hydrochlorothiazide (HYZAAR) 100-25 MG per tablet       . NEXIUM 40 MG capsule       . PARoxetine (PAXIL) 30 MG tablet       . Thiamine HCl (VITAMIN B-1 PO) Take by  mouth.      . traMADol (ULTRAM-ER) 100 MG 24 hr tablet        No current facility-administered medications for this visit.     Allergies  Allergen Reactions  . Penicillins Rash    BP 162/84  Pulse 76  Temp(Src) 98.3 F (36.8 C) (Oral)  Resp 16  Ht 5\' 5"  (1.651 m)  Wt 265 lb (120.203 kg)  BMI 44.1 kg/m2  No results found.   Review of Systems  Constitutional: Negative for fever, chills, diaphoresis, appetite change and fatigue.  HENT: Negative for ear pain, sore throat, trouble swallowing, neck pain and ear discharge.   Eyes: Negative for photophobia, discharge and visual disturbance.  Respiratory: Positive for shortness of breath. Negative for apnea, cough, choking, chest tightness, wheezing and stridor.   Cardiovascular: Negative for chest pain, palpitations and leg swelling.  Gastrointestinal: Negative for nausea, vomiting, abdominal pain, diarrhea, constipation, blood in stool, abdominal distention, anal bleeding and rectal pain.    Genitourinary: Positive for difficulty urinating. Negative for dysuria and frequency.  Musculoskeletal: Positive for myalgias, back pain and arthralgias. Negative for gait problem.  Skin: Negative for color change, pallor and rash.  Neurological: Positive for headaches. Negative for dizziness, speech difficulty, weakness and numbness.  Hematological: Negative for adenopathy.  Psychiatric/Behavioral: Negative for confusion and agitation. The patient is not nervous/anxious.        Objective:   Physical Exam  Constitutional: She is oriented to person, place, and time. She appears well-developed and well-nourished. No distress.  HENT:  Head: Normocephalic.  Mouth/Throat: Oropharynx is clear and moist. No oropharyngeal exudate.  Eyes: Conjunctivae and EOM are normal. Pupils are equal, round, and reactive to light. No scleral icterus.  Neck: Normal range of motion. Neck supple. No tracheal deviation present.  Cardiovascular: Normal rate, regular rhythm and intact distal pulses.   Pulmonary/Chest: Effort normal and breath sounds normal. No respiratory distress. She exhibits no tenderness.  Abdominal: Soft. She exhibits no distension and no mass. There is no tenderness. Hernia confirmed negative in the right inguinal area and confirmed negative in the left inguinal area.    Obese but soft  Genitourinary: No vaginal discharge found.  Musculoskeletal: Normal range of motion. She exhibits no tenderness.  Lymphadenopathy:    She has no cervical adenopathy.       Right: No inguinal adenopathy present.       Left: No inguinal adenopathy present.  Neurological: She is alert and oriented to person, place, and time. No cranial nerve deficit. She exhibits normal muscle tone. Coordination normal.  Skin: Skin is warm and dry. No rash noted. She is not diaphoretic. No erythema.     Psychiatric: She has a normal mood and affect. Her behavior is normal. Judgment and thought content normal.        Assessment:     A longitudinal more sensitive mass on left posterior buttock in the subcutaneous tissues.  Probable lipoma.  Strong family history of masses, one ?sarcoma     Plan:     I recommended removal since it is becoming larger and more symptomatic.  Also the fact a strong family history of masses is concerning as wellI think it is too large to removal of local anesthesia.  I recommended at least deep sedation with probable general anesthesia/laryngeal mask airway.  We will see what anesthesia thinks.  Decubitus positioning given the fact it is on the left posterior side.  She has a busy schedule of traveling the next few  months.  We will try and coordinate a convenient time.   The pathophysiology of skin & subcutaneous masses was discussed.  Natural history risks without surgery were discussed.  I recommended surgery to remove the mass.  I explained the technique of removal with use of local anesthesia & possible need for more aggressive sedation/anesthesia for patient comfort.    Risks such as bleeding, infection, heart attack, death, and other risks were discussed.  I noted a good likelihood this will help address the problem.   Possibility that this will not correct all symptoms was explained. Possibility of regrowth/recurrence of the mass was discussed.  We will work to minimize complications. Questions were answered.  The patient expresses understanding & wishes to proceed with surgery.

## 2013-02-03 NOTE — Patient Instructions (Addendum)
See the Handout(s) we gave you.  Consider surgery.  Please call our office at [phone] if you wish to schedule surgery or if you have further questions / concerns.    Lipoma A lipoma is a noncancerous (benign) tumor composed of fat cells. They are usually found under the skin (subcutaneous). A lipoma may occur in any tissue of the body that contains fat. Common areas for lipomas to appear include the back, shoulders, buttocks, and thighs. Lipomas are a very common soft tissue growth. They are soft and grow slowly. Most problems caused by a lipoma depend on where it is growing. DIAGNOSIS  A lipoma can be diagnosed with a physical exam. These tumors rarely become cancerous, but radiographic studies can help determine this for certain. Studies used may include:  Computerized X-ray scans (CT or CAT scan).  Computerized magnetic scans (MRI). TREATMENT  Small lipomas that are not causing problems may be watched. If a lipoma continues to enlarge or causes problems, removal is often the best treatment. Lipomas can also be removed to improve appearance. Surgery is done to remove the fatty cells and the surrounding capsule. Most often, this is done with medicine that numbs the area (local anesthetic). The removed tissue is examined under a microscope to make sure it is not cancerous. Keep all follow-up appointments with your caregiver. SEEK MEDICAL CARE IF:   The lipoma becomes larger or hard.  The lipoma becomes painful, red, or increasingly swollen. These could be signs of infection or a more serious condition. Document Released: 08/31/2002 Document Revised: 12/03/2011 Document Reviewed: 02/10/2010 [facility] Patient Information 2013 [place], [place].  GENERAL SURGERY: POST OP INSTRUCTIONS  1. DIET: Follow a light bland diet the first 24 hours after arrival home, such as soup, liquids, crackers, etc.  Be sure to include lots of fluids daily.  Avoid fast food or heavy meals as your are more likely  to get nauseated.   2. Take your usually prescribed home medications unless otherwise directed. 3. PAIN CONTROL: a. Pain is best controlled by a usual combination of three different methods TOGETHER: i. Ice/Heat ii. Over the counter pain medication iii. Prescription pain medication b. Most patients will experience some swelling and bruising around the incisions.  Ice packs or heating pads (30-60 minutes up to 6 times a day) will help. Use ice for the first few days to help decrease swelling and bruising, then switch to heat to help relax tight/sore spots and speed recovery.  Some people prefer to use ice alone, heat alone, alternating between ice & heat.  Experiment to what works for you.  Swelling and bruising can take several weeks to resolve.   c. It is helpful to take an over-the-counter pain medication regularly for the first few weeks.  Choose one of the following that works best for you: i. Naproxen (Aleve, etc)  Two 220mg  tabs twice a day ii. Ibuprofen (Advil, etc) Three 200mg  tabs four times a day (every meal & bedtime) iii. Acetaminophen (Tylenol, etc) 500-650mg  four times a day (every meal & bedtime) d. A  prescription for pain medication (such as oxycodone, hydrocodone, etc) should be given to you upon discharge.  Take your pain medication as prescribed.  i. If you are having problems/concerns with the prescription medicine (does not control pain, nausea, vomiting, rash, itching, etc), please call us [phone] to see if we need to switch you to a different pain medicine that will work better for you and/or control your side effect better. ii.  If you need a refill on your pain medication, please contact your pharmacy.  They will contact our office to request authorization. Prescriptions will not be filled after 5 pm or on week-ends. 4. Avoid getting constipated.  Between the surgery and the pain medications, it is common to experience some constipation.  Increasing fluid intake and  taking a fiber supplement (such as Metamucil, Citrucel, FiberCon, MiraLax, etc) 1-2 times a day regularly will usually help prevent this problem from occurring.  A mild laxative (prune juice, Milk of Magnesia, MiraLax, etc) should be taken according to package directions if there are no bowel movements after 48 hours.   5. Wash / shower every day.  You may shower over the dressings as they are waterproof.  Continue to shower over incision(s) after the dressing is off. 6. Remove your waterproof bandages 5 days after surgery.  You may leave the incision open to air.  You may have skin tapes (Steri Strips) covering the incision(s).  Leave them on until one week, then remove.  You may replace a dressing/Band-Aid to cover the incision for comfort if you wish.      7. ACTIVITIES as tolerated:   a. You may resume regular (light) daily activities beginning the next day-such as daily self-care, walking, climbing stairs-gradually increasing activities as tolerated.  If you can walk 30 minutes without difficulty, it is safe to try more intense activity such as jogging, treadmill, bicycling, low-impact aerobics, swimming, etc. b. Save the most intensive and strenuous activity for last such as sit-ups, heavy lifting, contact sports, etc  Refrain from any heavy lifting or straining until you are off narcotics for pain control.   c. DO NOT PUSH THROUGH PAIN.  Let pain be your guide: If it hurts to do something, don't do it.  Pain is your body warning you to avoid that activity for another week until the pain goes down. d. You may drive when you are no longer taking prescription pain medication, you can comfortably wear a seatbelt, and you can safely maneuver your car and apply brakes. e. Bonita Quin may have sexual intercourse when it is comfortable.  8. FOLLOW UP in our office a. Please call [facility] at [phone] to set up an appointment to see your surgeon in the office for a follow-up appointment approximately 2-3 weeks  after your surgery. b. Make sure that you call for this appointment the day you arrive home to insure a convenient appointment time. 9. IF YOU HAVE DISABILITY OR FAMILY LEAVE FORMS, BRING THEM TO THE OFFICE FOR PROCESSING.  DO NOT GIVE THEM TO YOUR DOCTOR.   WHEN TO CALL us [phone]: 1. Poor pain control 2. Reactions / problems with new medications (rash/itching, nausea, etc)  3. Fever over 101.5 F (38.5 C) 4. Worsening swelling or bruising 5. Continued bleeding from incision. 6. Increased pain, redness, or drainage from the incision 7. Difficulty breathing / swallowing   The clinic staff is available to answer your questions during regular business hours (8:30am-5pm).  Please don't hesitate to call and ask to speak to one of our nurses for clinical concerns.   If you have a medical emergency, go to the nearest emergency room or call 911.  A surgeon from [facility] is always on call at the [facility], [place] [address], [place], [place]  [address] ? MAIN: [phone] ? TOLL FREE: [phone] ?  FAX [phone] [email or web address]

## 2013-02-04 IMAGING — CT CT L SPINE W/O CM
3 of 10 series · 10 of 28 positions shown, 11 images · non-contrast
Comparison: 01/09/2012

CLINICAL DATA: Radiculopathy, bilateral leg pain, weakness.
Previous fusion surgery.

CT LUMBAR SPINE WITHOUT CONTRAST
TECHNIQUE: Multidetector CT imaging of the lumbar spine was
performed without intravenous contrast administration. Multiplanar
CT image reconstructions were also generated.

[Series 4: l spine bone · axial · 0.27mm/px · z∈[+5,+73]mm · 2 of 82 slices shown, 3 images]
[im 28/82  soft-tissue]
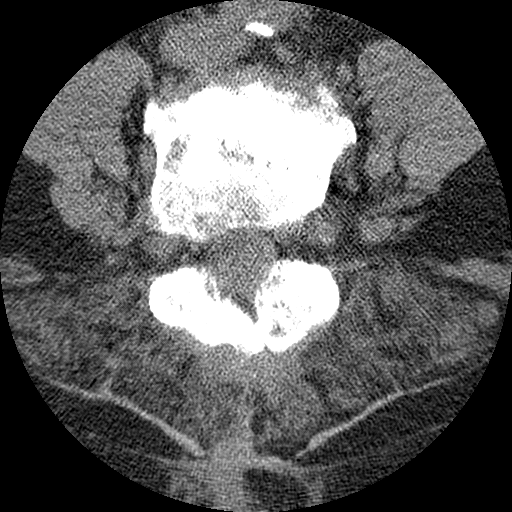
[im 28/82  bone]
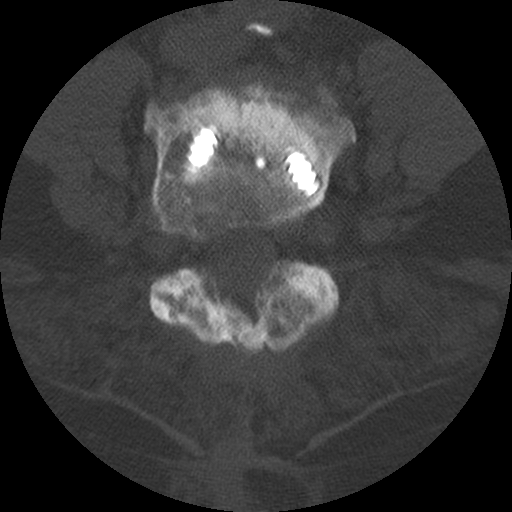
[im 55/82  bone]
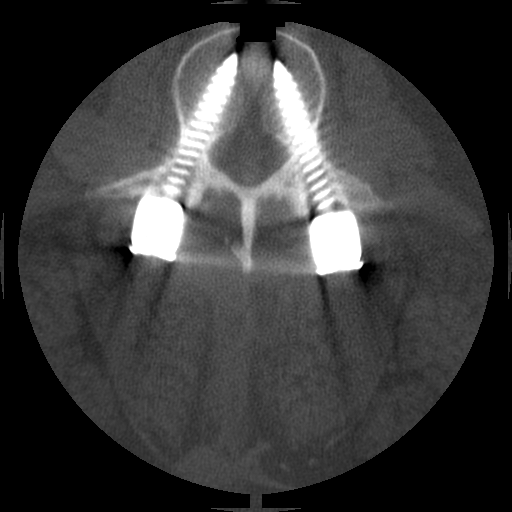

[Series 5: l-spine detail · axial · 0.27mm/px · z∈[+5,+73]mm · 2 of 82 slices shown]
[im 28/82  bone]
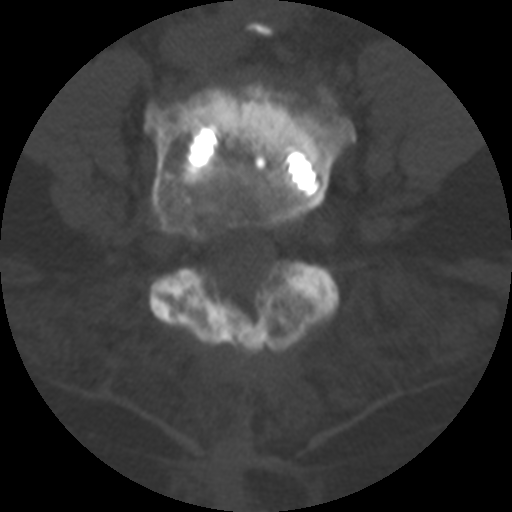
[im 55/82  bone]
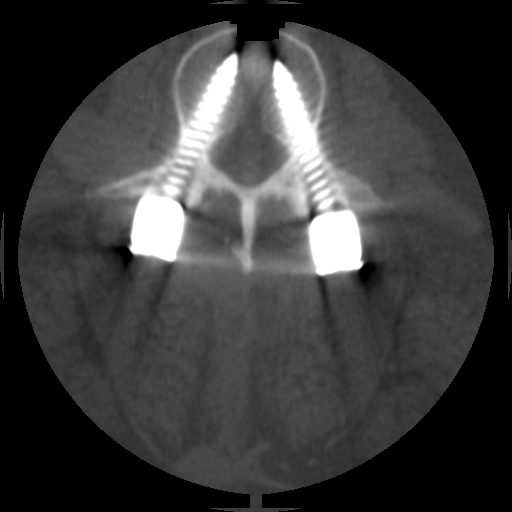

[Series 201: cor · coronal · 0.41mm/px · 6 of 63 slices shown]
[im 9/63  bone]
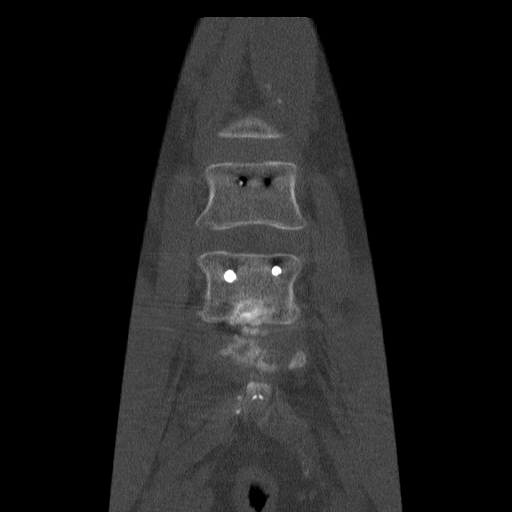
[im 12/63  soft-tissue]
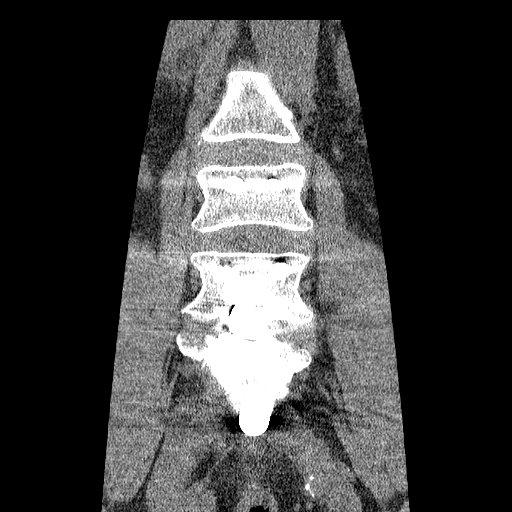
[im 18/63  bone]
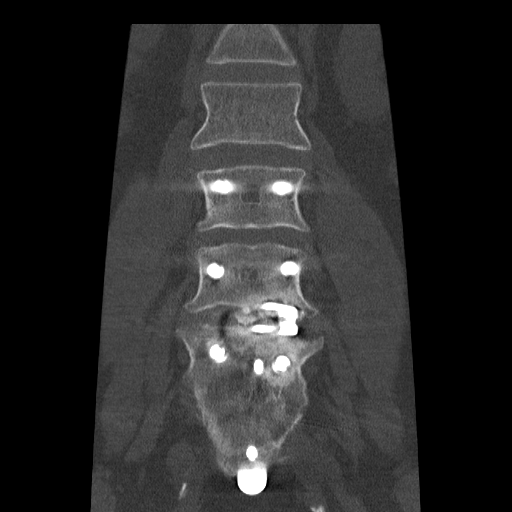
[im 27/63  bone]
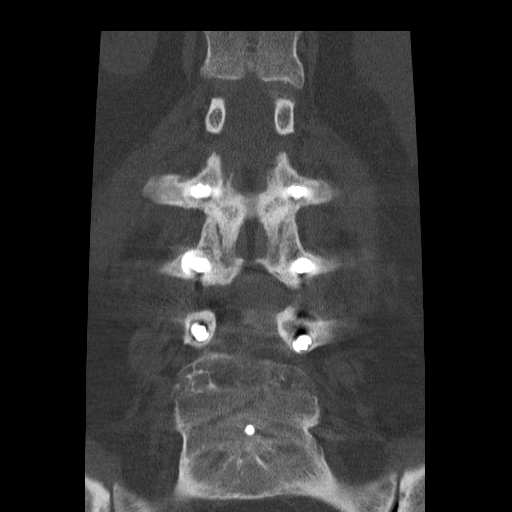
[im 36/63  bone]
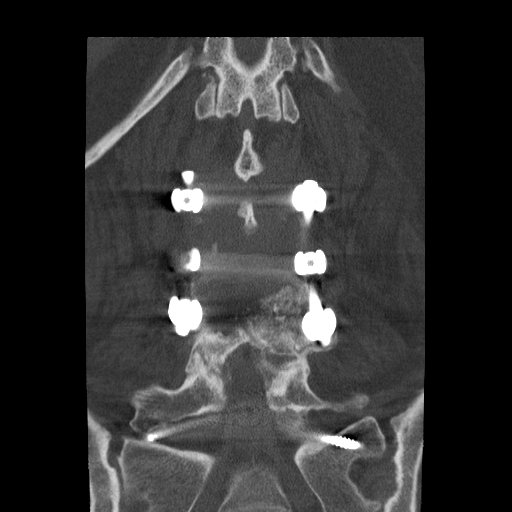
[im 45/63  bone]
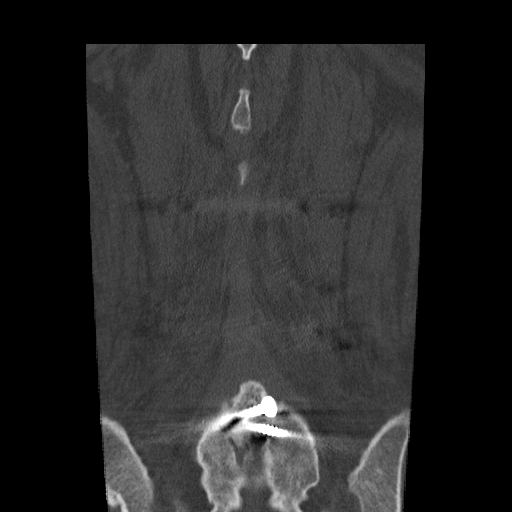

[10 of 28 positions shown; findings below may reference images not displayed]

FINDINGS: Numbering scheme follows that used on the previous study.
The five non-rib bearing lumbar segments are labeled L1-L5.  Normal
alignment.

T12-L1:  Unremarkable

L1-2:  Mild bilateral facet degenerative hypertrophy.  Minimal disc
bulge.  Central canal remains patent.  There is mild encroachment
upon the left neural foramen.

L2-3:  Bilateral pedicle screws at each level with vertical
interconnecting rods, hardware intact without surrounding lucency.
Posterior decompression at  the level of the interspace.  There is
solid appearing fusion across the posterior elements.  Central
canal and foramina appear patent.

L3-4: Bilateral L4 pedicle screws, with a small amount of adjacent
lucency left greater than right.  Progression of subsidence of the
cage in the interspace into the inferior L3 endplate with some
lucency immediately adjacent to the cage, and sclerosis in the
adjacent vertebral bodies.  There is irregularity of the anterior
aspect   L4 superior endplate as before.  No definite solid bony
bridging across the interspace.  The alignment is preserved.
Central canal and foramina appear patent.  Posterior decompression
extends across this level.

L4-5:  Solid appearing fusion across the interspace and posterior
elements.  Central canal and foramina patent.

L5-S1:  Solid fusion across the interspace and posterior elements
post instrumented fusion.  Central canal and foramina patent.
IMPRESSION: 1.  Persistent lucency around the interbody cage L3-4 and L4
pedicle screws suggesting motion or nonunion.
2.  Solid appearing fusion L2-3 and L4-S1.

## 2013-02-05 ENCOUNTER — Encounter (HOSPITAL_COMMUNITY): Payer: Self-pay | Admitting: Pharmacy Technician

## 2013-02-09 ENCOUNTER — Encounter (HOSPITAL_COMMUNITY): Payer: Self-pay

## 2013-02-09 ENCOUNTER — Ambulatory Visit (HOSPITAL_COMMUNITY)
Admission: RE | Admit: 2013-02-09 | Discharge: 2013-02-09 | Disposition: A | Discharge status: Home / Self Care | Payer: Medicare Other | Source: Ambulatory Visit | Attending: Surgery | Admitting: Surgery

## 2013-02-09 ENCOUNTER — Encounter (HOSPITAL_COMMUNITY)
Admission: RE | Admit: 2013-02-09 | Discharge: 2013-02-09 | Disposition: A | Discharge status: Home / Self Care | Payer: Medicare Other | Source: Ambulatory Visit | Attending: Surgery | Admitting: Surgery

## 2013-02-09 DIAGNOSIS — Z87891 Personal history of nicotine dependence: Secondary | ICD-10-CM | POA: Insufficient documentation

## 2013-02-09 DIAGNOSIS — Z01818 Encounter for other preprocedural examination: Secondary | ICD-10-CM | POA: Diagnosis not present

## 2013-02-09 DIAGNOSIS — Z01810 Encounter for preprocedural cardiovascular examination: Secondary | ICD-10-CM | POA: Insufficient documentation

## 2013-02-09 DIAGNOSIS — I10 Essential (primary) hypertension: Secondary | ICD-10-CM | POA: Insufficient documentation

## 2013-02-09 DIAGNOSIS — R0602 Shortness of breath: Secondary | ICD-10-CM | POA: Insufficient documentation

## 2013-02-09 DIAGNOSIS — R229 Localized swelling, mass and lump, unspecified: Secondary | ICD-10-CM | POA: Diagnosis not present

## 2013-02-09 DIAGNOSIS — Z01812 Encounter for preprocedural laboratory examination: Secondary | ICD-10-CM | POA: Insufficient documentation

## 2013-02-09 HISTORY — DX: Other chronic pain: G89.29

## 2013-02-09 HISTORY — DX: Essential (primary) hypertension: I10

## 2013-02-09 HISTORY — DX: Gastro-esophageal reflux disease without esophagitis: K21.9

## 2013-02-09 HISTORY — DX: Unspecified osteoarthritis, unspecified site: M19.90

## 2013-02-09 HISTORY — DX: Dorsalgia, unspecified: M54.9

## 2013-02-09 HISTORY — DX: Dysphonia: R49.0

## 2013-02-09 LAB — SURGICAL PCR SCREEN
MRSA, PCR: NEGATIVE
Staphylococcus aureus: NEGATIVE

## 2013-02-09 LAB — BASIC METABOLIC PANEL
BUN: 29 mg/dL — ABNORMAL HIGH (ref 6–23)
CO2: 32 mEq/L (ref 19–32)
Calcium: 9.6 mg/dL (ref 8.4–10.5)
Chloride: 101 mEq/L (ref 96–112)
Creatinine, Ser: 1.17 mg/dL — ABNORMAL HIGH (ref 0.50–1.10)
GFR calc Af Amer: 55 mL/min — ABNORMAL LOW (ref >90)
GFR calc non Af Amer: 47 mL/min — ABNORMAL LOW (ref >90)
Glucose, Bld: 100 mg/dL — ABNORMAL HIGH (ref 70–99)
Potassium: 4.4 mEq/L (ref 3.5–5.1)
Sodium: 140 mEq/L (ref 135–145)

## 2013-02-09 LAB — CBC
HCT: 40.2 % (ref 36.0–46.0)
Hemoglobin: 13.6 g/dL (ref 12.0–15.0)
MCH: 30.2 pg (ref 26.0–34.0)
MCHC: 33.8 g/dL (ref 30.0–36.0)
MCV: 89.1 fL (ref 78.0–100.0)
Platelets: 339 10*3/uL (ref 150–400)
RBC: 4.51 MIL/uL (ref 3.87–5.11)
RDW: 14.6 % (ref 11.5–15.5)
WBC: 4.5 10*3/uL (ref 4.0–10.5)

## 2013-02-09 NOTE — Patient Instructions (Addendum)
20 [name]  02/09/2013   Your procedure is scheduled on:   02-20-2013  Report to [facility] at     0530   AM .  Call this number if you have problems the morning of surgery: [phone]  Or Presurgical Testing [phone]([place])   For Cpap use: Bring mask and tubing only.   Do not eat food:After Midnight.    Take these medicines the morning of surgery with A SIP OF WATER: Nexium. Plendil. Tramadol.   Do not wear jewelry, make-up or nail polish.  Do not wear lotions, powders, or perfumes. You may wear deodorant.  Do not shave 12 hours prior to first CHG shower(legs and under arms).(face and neck okay.)  Do not bring valuables to the hospital.  Contacts, dentures or bridgework,body piercing,  may not be worn into surgery.  Leave suitcase in the car. After surgery it may be brought to your room.  For patients admitted to the hospital, checkout time is 11:00 AM the day of discharge.   Patients discharged the day of surgery will not be allowed to drive home. Must have responsible person with you x 24 hours once discharged.  Name and phone number of your driver: Jaci Lazier [phone] cell  Special Instructions: CHG(Chlorhedine 4%-"Hibiclens","Betasept","Aplicare") Shower Use Special Wash: see special instructions.(avoid face and genitals)   Please read over the following fact sheets that you were given: MRSA Information, Blood Transfusion fact sheet, Incentive Spirometry Instruction.    Failure to follow these instructions may result in Cancellation of your surgery.   Patient signature_______________________________________________________

## 2013-02-09 NOTE — Pre-Procedure Instructions (Signed)
02-09-13 EKG/ CXR today

## 2013-02-17 ENCOUNTER — Encounter (INDEPENDENT_AMBULATORY_CARE_PROVIDER_SITE_OTHER): Payer: Self-pay

## 2013-02-19 DIAGNOSIS — M961 Postlaminectomy syndrome, not elsewhere classified: Secondary | ICD-10-CM | POA: Diagnosis not present

## 2013-02-19 DIAGNOSIS — M549 Dorsalgia, unspecified: Secondary | ICD-10-CM | POA: Diagnosis not present

## 2013-02-19 MED ORDER — CLINDAMYCIN PHOSPHATE 600 MG/50ML IV SOLN
600.0000 mg | INTRAVENOUS | Status: AC
  Administered 2013-02-20: 600 mg via INTRAVENOUS
  Filled 2013-02-19 (×2): qty 50

## 2013-02-19 MED ORDER — GENTAMICIN SULFATE 40 MG/ML IJ SOLN
500.0000 mg | INTRAVENOUS | Status: AC
  Administered 2013-02-20: 500 mg via INTRAVENOUS
  Filled 2013-02-19 (×2): qty 12.5

## 2013-02-20 ENCOUNTER — Encounter (HOSPITAL_COMMUNITY): Payer: Self-pay | Admitting: *Deleted

## 2013-02-20 ENCOUNTER — Encounter (HOSPITAL_COMMUNITY): Payer: Self-pay | Admitting: Anesthesiology

## 2013-02-20 ENCOUNTER — Encounter (HOSPITAL_COMMUNITY)
Admission: RE | Disposition: A | Discharge status: Home / Self Care | Payer: Self-pay | Source: Ambulatory Visit | Attending: Surgery

## 2013-02-20 ENCOUNTER — Ambulatory Visit (HOSPITAL_COMMUNITY): Payer: Medicare Other | Admitting: Anesthesiology

## 2013-02-20 ENCOUNTER — Ambulatory Visit (HOSPITAL_COMMUNITY)
Admission: RE | Admit: 2013-02-20 | Discharge: 2013-02-20 | Disposition: A | Discharge status: Home / Self Care | Payer: Medicare Other | Source: Ambulatory Visit | Attending: Surgery | Admitting: Surgery

## 2013-02-20 DIAGNOSIS — G4733 Obstructive sleep apnea (adult) (pediatric): Secondary | ICD-10-CM | POA: Diagnosis not present

## 2013-02-20 DIAGNOSIS — Z87891 Personal history of nicotine dependence: Secondary | ICD-10-CM | POA: Diagnosis not present

## 2013-02-20 DIAGNOSIS — I10 Essential (primary) hypertension: Secondary | ICD-10-CM | POA: Insufficient documentation

## 2013-02-20 DIAGNOSIS — J309 Allergic rhinitis, unspecified: Secondary | ICD-10-CM | POA: Insufficient documentation

## 2013-02-20 DIAGNOSIS — R229 Localized swelling, mass and lump, unspecified: Secondary | ICD-10-CM | POA: Diagnosis not present

## 2013-02-20 DIAGNOSIS — Z79899 Other long term (current) drug therapy: Secondary | ICD-10-CM | POA: Diagnosis not present

## 2013-02-20 DIAGNOSIS — Z981 Arthrodesis status: Secondary | ICD-10-CM | POA: Insufficient documentation

## 2013-02-20 DIAGNOSIS — Z6841 Body Mass Index (BMI) 40.0 and over, adult: Secondary | ICD-10-CM | POA: Diagnosis not present

## 2013-02-20 DIAGNOSIS — D1739 Benign lipomatous neoplasm of skin and subcutaneous tissue of other sites: Secondary | ICD-10-CM | POA: Diagnosis not present

## 2013-02-20 HISTORY — PX: MASS EXCISION: SHX2000

## 2013-02-20 SURGERY — EXCISION MASS
Anesthesia: General | Site: Buttocks | Laterality: Left | Wound class: Clean

## 2013-02-20 MED ORDER — LACTATED RINGERS IV SOLN: INTRAVENOUS | Status: DC

## 2013-02-20 MED ORDER — MIDAZOLAM HCL 5 MG/5ML IJ SOLN
INTRAMUSCULAR | Status: DC | PRN
  Administered 2013-02-20: 1 mg via INTRAVENOUS

## 2013-02-20 MED ORDER — FENTANYL CITRATE 0.05 MG/ML IJ SOLN
INTRAMUSCULAR | Status: DC | PRN
  Administered 2013-02-20 (×2): 50 ug via INTRAVENOUS
  Administered 2013-02-20: 100 ug via INTRAVENOUS
  Administered 2013-02-20: 50 ug via INTRAVENOUS

## 2013-02-20 MED ORDER — ONDANSETRON HCL 4 MG/2ML IJ SOLN: 4.0000 mg | Freq: Four times a day (QID) | INTRAMUSCULAR | Status: DC | PRN

## 2013-02-20 MED ORDER — BUPIVACAINE-EPINEPHRINE 0.25% -1:200000 IJ SOLN
INTRAMUSCULAR | Status: AC
  Filled 2013-02-20: qty 1

## 2013-02-20 MED ORDER — SUCCINYLCHOLINE CHLORIDE 20 MG/ML IJ SOLN
INTRAMUSCULAR | Status: DC | PRN
  Administered 2013-02-20: 100 mg via INTRAVENOUS

## 2013-02-20 MED ORDER — OXYCODONE HCL 5 MG PO TABS
5.0000 mg | ORAL_TABLET | ORAL | Status: DC | PRN
  Administered 2013-02-20: 5 mg via ORAL
  Filled 2013-02-20: qty 1

## 2013-02-20 MED ORDER — FENTANYL CITRATE 0.05 MG/ML IJ SOLN: 25.0000 ug | INTRAMUSCULAR | Status: DC | PRN

## 2013-02-20 MED ORDER — CHLORHEXIDINE GLUCONATE 4 % EX LIQD: 1.0000 "application " | Freq: Once | CUTANEOUS | Status: DC

## 2013-02-20 MED ORDER — MEPERIDINE HCL 50 MG/ML IJ SOLN: 6.2500 mg | INTRAMUSCULAR | Status: DC | PRN

## 2013-02-20 MED ORDER — SODIUM CHLORIDE 0.9 % IV SOLN: 250.0000 mL | INTRAVENOUS | Status: DC | PRN

## 2013-02-20 MED ORDER — ACETAMINOPHEN 325 MG PO TABS: 650.0000 mg | ORAL_TABLET | ORAL | Status: DC | PRN

## 2013-02-20 MED ORDER — OXYCODONE HCL 5 MG PO TABS: 5.0000 mg | ORAL_TABLET | ORAL | Status: DC | PRN

## 2013-02-20 MED ORDER — GLYCOPYRROLATE 0.2 MG/ML IJ SOLN
INTRAMUSCULAR | Status: DC | PRN
  Administered 2013-02-20: .5 mg via INTRAVENOUS

## 2013-02-20 MED ORDER — NEOSTIGMINE METHYLSULFATE 1 MG/ML IJ SOLN
INTRAMUSCULAR | Status: DC | PRN
  Administered 2013-02-20: 4 mg via INTRAVENOUS

## 2013-02-20 MED ORDER — LACTATED RINGERS IV SOLN
INTRAVENOUS | Status: DC | PRN
  Administered 2013-02-20: 07:00:00 via INTRAVENOUS

## 2013-02-20 MED ORDER — ONDANSETRON HCL 4 MG/2ML IJ SOLN
INTRAMUSCULAR | Status: DC | PRN
  Administered 2013-02-20: 4 mg via INTRAVENOUS

## 2013-02-20 MED ORDER — SODIUM CHLORIDE 0.9 % IJ SOLN: 3.0000 mL | INTRAMUSCULAR | Status: DC | PRN

## 2013-02-20 MED ORDER — IOHEXOL 300 MG/ML  SOLN
INTRAMUSCULAR | Status: AC
  Filled 2013-02-20: qty 1

## 2013-02-20 MED ORDER — SODIUM CHLORIDE 0.9 % IJ SOLN: 3.0000 mL | Freq: Two times a day (BID) | INTRAMUSCULAR | Status: DC

## 2013-02-20 MED ORDER — PROPOFOL 10 MG/ML IV BOLUS
INTRAVENOUS | Status: DC | PRN
  Administered 2013-02-20: 150 mg via INTRAVENOUS

## 2013-02-20 MED ORDER — PROMETHAZINE HCL 25 MG/ML IJ SOLN: 6.2500 mg | INTRAMUSCULAR | Status: DC | PRN

## 2013-02-20 MED ORDER — ACETAMINOPHEN 650 MG RE SUPP: 650.0000 mg | RECTAL | Status: DC | PRN

## 2013-02-20 MED ORDER — BUPIVACAINE-EPINEPHRINE PF 0.25-1:200000 % IJ SOLN
INTRAMUSCULAR | Status: DC | PRN
  Administered 2013-02-20: 30 mL
  Administered 2013-02-20: 20 mL

## 2013-02-20 MED ORDER — NAPROXEN 500 MG PO TABS: 500.0000 mg | ORAL_TABLET | Freq: Two times a day (BID) | ORAL | Status: DC

## 2013-02-20 MED ORDER — CISATRACURIUM BESYLATE (PF) 10 MG/5ML IV SOLN
INTRAVENOUS | Status: DC | PRN
  Administered 2013-02-20: 2 mg via INTRAVENOUS

## 2013-02-20 SURGICAL SUPPLY — 34 items
CANISTER SUCTION 2500CC (MISCELLANEOUS) ×1 IMPLANT
CLOTH BEACON ORANGE TIMEOUT ST (SAFETY) ×2 IMPLANT
CONNECTOR 5 IN 1 STRAIGHT STRL (MISCELLANEOUS) ×1 IMPLANT
DRAIN CHANNEL 15F RND FF 3/16 (WOUND CARE) ×1 IMPLANT
DRAPE LAPAROSCOPIC ABDOMINAL (DRAPES) ×1 IMPLANT
DRAPE LAPAROTOMY TRNSV 102X78 (DRAPE) ×1 IMPLANT
DRSG TEGADERM 2-3/8X2-3/4 SM (GAUZE/BANDAGES/DRESSINGS) ×1 IMPLANT
DRSG TEGADERM 4X4.75 (GAUZE/BANDAGES/DRESSINGS) ×3 IMPLANT
EVACUATOR 1/8 PVC DRAIN (DRAIN) ×1 IMPLANT
GAUZE SPONGE 4X4 16PLY XRAY LF (GAUZE/BANDAGES/DRESSINGS) ×1 IMPLANT
GLOVE BIOGEL PI IND STRL 7.0 (GLOVE) ×1 IMPLANT
GLOVE BIOGEL PI IND STRL 8 (GLOVE) IMPLANT
GLOVE BIOGEL PI INDICATOR 7.0 (GLOVE) ×4
GLOVE BIOGEL PI INDICATOR 8 (GLOVE) ×1
GLOVE ECLIPSE 8.0 STRL XLNG CF (GLOVE) ×1 IMPLANT
GLOVE INDICATOR 8.0 STRL GRN (GLOVE) ×2 IMPLANT
GOWN STRL NON-REIN LRG LVL3 (GOWN DISPOSABLE) ×2 IMPLANT
GOWN STRL REIN XL XLG (GOWN DISPOSABLE) ×4 IMPLANT
KIT BASIN OR (CUSTOM PROCEDURE TRAY) ×2 IMPLANT
NEEDLE HYPO 22GX1.5 SAFETY (NEEDLE) ×2 IMPLANT
PACK GENERAL/GYN (CUSTOM PROCEDURE TRAY) ×2 IMPLANT
SET BERKELEY SUCTION TUBING (SUCTIONS) IMPLANT
SPONGE DRAIN TRACH 4X4 STRL 2S (GAUZE/BANDAGES/DRESSINGS) ×1 IMPLANT
SPONGE GAUZE 4X4 12PLY (GAUZE/BANDAGES/DRESSINGS) ×1 IMPLANT
STRIP CLOSURE SKIN 1/2X4 (GAUZE/BANDAGES/DRESSINGS) ×2 IMPLANT
SUT ETHILON 3 0 PS 1 (SUTURE) ×1 IMPLANT
SUT MON AB 4-0 PC3 18 (SUTURE) ×2 IMPLANT
SUT SILK 2 0 SH (SUTURE) ×1 IMPLANT
SUT VIC AB 2-0 SH 18 (SUTURE) ×2 IMPLANT
SYR 20CC LL (SYRINGE) ×1 IMPLANT
SYR CONTROL 10ML LL (SYRINGE) ×1 IMPLANT
TAPE CLOTH 4X10 WHT NS (GAUZE/BANDAGES/DRESSINGS) ×1 IMPLANT
TOWEL OR 17X26 10 PK STRL BLUE (TOWEL DISPOSABLE) ×2 IMPLANT
TUBING CONNECTING 10 (TUBING) ×1 IMPLANT

## 2013-02-20 NOTE — Op Note (Signed)
02/20/2013  9:05 AM  PATIENT:  Amy [name]  67 y.o. female  Patient Care Team: Maurice Small, MD as PCP - General (Family Medicine)  PRE-OPERATIVE DIAGNOSIS:  Subcutaneous mass       POST-OPERATIVE DIAGNOSIS:  Subcutaneous mass  PROCEDURE:  Procedure(s): EXCISION LEFT BUTTOCK  MASS  SURGEON:  Surgeon(s): Ardeth Sportsman, MD  ASSISTANT: none   ANESTHESIA:   local and general  EBL:  Total I/O In: 500 [I.V.:500] Out: -   Delay start of Pharmacological VTE agent (>24hrs) due to surgical blood loss or risk of bleeding:  no  DRAINS: (15) Blake drain(s) in the SQ buttock tissues   SPECIMEN:  Source of Specimen:  SQ buttock mass  DISPOSITION OF SPECIMEN:  PATHOLOGY  COUNTS:  YES  PLAN OF CARE: Discharge to home after PACU  PATIENT DISPOSITION:  PACU - hemodynamically stable.  INDICATION: Morbidly obese female with enlarging mass on the left buttock.  Suspicious for lipoma.  More sensitive as well.  I recommended removal:  The pathophysiology of skin & subcutaneous masses was discussed.  Natural history risks without surgery were discussed.  I recommended surgery to remove the mass.  I explained the technique of removal with use of local anesthesia & possible need for more aggressive sedation/anesthesia for patient comfort.    Risks such as bleeding, infection, heart attack, death, and other risks were discussed.  I noted a good likelihood this will help address the problem.   Possibility that this will not correct all symptoms was explained. Possibility of regrowth/recurrence of the mass was discussed.  We will work to minimize complications. Questions were answered.  The patient expresses understanding & wishes to proceed with surgery.   OR FINDINGS: 8 x 6 x 6 cm fibrous subcutaneous mass consistent with lipoma.  Poorly defined edges  DESCRIPTION: Informed consent was confirmed.  Patient underwent general anesthesia without difficulty.  She was positioned  decubitus left side up.  Her Botox forearm prepped and draped sterile fashion.  Surgical timeout for plan.  I made an oblique in 5 cm incision over the central part of the mass.  It extended that to 7 cm.  I freed the mass off the dermis and created skin flaps.  It came around the edges until I got to the deeper margin adherent to the gluteal fascia.  I carefully freed off using cautery and removed it in entirety.  There seemed to be a tail going more towards the inner thigh.  I irrigated the wound and ensured hemostasis.   Because of the large cavity space I did leave a drain is noted above.  I closed the wound with 2-0 Vicryl deep dermal stitches and 4 Monocryl stitch subcuticular.   Steri-Strips placed.  Patient extubated and sent to recovery room in stable condition.  I discussed postop care in detail with the patient in the holding area.  Instructions are written.  About to locate her husband and family to further discuss.

## 2013-02-20 NOTE — Transfer of Care (Signed)
Immediate Anesthesia Transfer of Care Note  Patient: Amy [name]  Procedure(s) Performed: Procedure(s): EXCISION LEFT BUTTOCK  MASS (Left)  Patient Location: PACU  Anesthesia Type:General  Level of Consciousness: awake and patient cooperative  Airway & Oxygen Therapy: Patient Spontanous Breathing and Patient connected to face mask oxygen  Post-op Assessment: Report given to PACU RN and Post -op Vital signs reviewed and stable  Post vital signs: Reviewed and stable  Complications: No apparent anesthesia complications

## 2013-02-20 NOTE — H&P (View-Only) (Signed)
Subjective:     Patient ID: Amy [name], female   DOB: 07/25/1946, 67 y.o.   MRN: [id number]  HPI  Amy [name]  Amy [name] [id number]  Patient Care Team: Elaine Griffin, MD as PCP - General (Family Medicine)  This patient is a 67 y.o.female who presents today for surgical evaluation at the request of Dr. Griffin   Reason for visit: Enlarging mass on left buttock.  Pleasant morbidly obese female.  Has noticed a lump on her left buttock for decades.  More recently and is become larger.  More sensitive.  Her aunt and mother had similar masses.  One sounds like a lipoma.  The other one she thought might of been a cancer.  She was concerned given the recent change.  She mention it to her primary care physician.  Dr. Griffin sent the patient to me for evaluation.   No exertional chest/neck/shoulder/arm pain.  Patient can walk 20 minutes for about 1/2 miles without difficulty.   No history of neurofibromatosis.  No history of skin infections nor MRSA.  No other subcutaneous/skin masses in her personal history.  No fall or trauma   Patient Active Problem List   Diagnosis Date Noted  . Obesity, Class III, BMI 40-49.9 (morbid obesity) 02/03/2013  . Mass of left posterior buttock, SQ, 6x6x6cm 02/03/2013  . DYSPNEA 01/23/2008  . COUGH 01/23/2008  . OBSTRUCTIVE SLEEP APNEA 09/12/2007  . HYPERTENSION 09/12/2007  . ALLERGIC RHINITIS 09/12/2007  . SLEEPINESS 09/12/2007  . ANGINA, HX OF 09/12/2007    Past Medical History  Diagnosis Date  . Anxiety     Past Surgical History  Procedure Laterality Date  . Abdominal hysterectomy    . Spinal fusion    . Nose surgery    . Bone graft hip iliac crest Right     for lumbar spinal surgery/fusion    History   Social History  . Marital Status: Married    Spouse Name: N/A    Number of Children: N/A  . Years of Education: N/A   Occupational History  . Not on file.   Social History Main Topics  . Smoking status:  Former Smoker  . Smokeless tobacco: Never Used  . Alcohol Use: No  . Drug Use: No  . Sexually Active: Not on file   Other Topics Concern  . Not on file   Social History Narrative  . No narrative on file    Family History  Problem Relation Age of Onset  . Hypertension Mother   . Cancer Mother     cervix  . Kidney disease Mother   . Hypertension Sister   . Cancer Sister     polyps  . Kidney disease Brother   . Cancer Daughter     leukemia  . Hypertension Brother     Current Outpatient Prescriptions  Medication Sig Dispense Refill  . CELEBREX 200 MG capsule       . Cholecalciferol (VITAMIN D PO) Take by mouth.      . felodipine (PLENDIL) 10 MG 24 hr tablet       . glycopyrrolate (ROBINUL) 2 MG tablet       . imiquimod (ALDARA) 5 % cream       . losartan-hydrochlorothiazide (HYZAAR) 100-25 MG per tablet       . NEXIUM 40 MG capsule       . PARoxetine (PAXIL) 30 MG tablet       . Thiamine HCl (VITAMIN B-1 PO) Take by   mouth.      . traMADol (ULTRAM-ER) 100 MG 24 hr tablet        No current facility-administered medications for this visit.     Allergies  Allergen Reactions  . Penicillins Rash    BP 162/84  Pulse 76  Temp(Src) 98.3 F (36.8 C) (Oral)  Resp 16  Ht 5' 5" (1.651 m)  Wt 265 lb (120.203 kg)  BMI 44.1 kg/m2  No results found.   Review of Systems  Constitutional: Negative for fever, chills, diaphoresis, appetite change and fatigue.  HENT: Negative for ear pain, sore throat, trouble swallowing, neck pain and ear discharge.   Eyes: Negative for photophobia, discharge and visual disturbance.  Respiratory: Positive for shortness of breath. Negative for apnea, cough, choking, chest tightness, wheezing and stridor.   Cardiovascular: Negative for chest pain, palpitations and leg swelling.  Gastrointestinal: Negative for nausea, vomiting, abdominal pain, diarrhea, constipation, blood in stool, abdominal distention, anal bleeding and rectal pain.    Genitourinary: Positive for difficulty urinating. Negative for dysuria and frequency.  Musculoskeletal: Positive for myalgias, back pain and arthralgias. Negative for gait problem.  Skin: Negative for color change, pallor and rash.  Neurological: Positive for headaches. Negative for dizziness, speech difficulty, weakness and numbness.  Hematological: Negative for adenopathy.  Psychiatric/Behavioral: Negative for confusion and agitation. The patient is not nervous/anxious.        Objective:   Physical Exam  Constitutional: She is oriented to person, place, and time. She appears well-developed and well-nourished. No distress.  HENT:  Head: Normocephalic.  Mouth/Throat: Oropharynx is clear and moist. No oropharyngeal exudate.  Eyes: Conjunctivae and EOM are normal. Pupils are equal, round, and reactive to light. No scleral icterus.  Neck: Normal range of motion. Neck supple. No tracheal deviation present.  Cardiovascular: Normal rate, regular rhythm and intact distal pulses.   Pulmonary/Chest: Effort normal and breath sounds normal. No respiratory distress. She exhibits no tenderness.  Abdominal: Soft. She exhibits no distension and no mass. There is no tenderness. Hernia confirmed negative in the right inguinal area and confirmed negative in the left inguinal area.    Obese but soft  Genitourinary: No vaginal discharge found.  Musculoskeletal: Normal range of motion. She exhibits no tenderness.  Lymphadenopathy:    She has no cervical adenopathy.       Right: No inguinal adenopathy present.       Left: No inguinal adenopathy present.  Neurological: She is alert and oriented to person, place, and time. No cranial nerve deficit. She exhibits normal muscle tone. Coordination normal.  Skin: Skin is warm and dry. No rash noted. She is not diaphoretic. No erythema.     Psychiatric: She has a normal mood and affect. Her behavior is normal. Judgment and thought content normal.        Assessment:     A longitudinal more sensitive mass on left posterior buttock in the subcutaneous tissues.  Probable lipoma.  Strong family history of masses, one ?sarcoma     Plan:     I recommended removal since it is becoming larger and more symptomatic.  Also the fact a strong family history of masses is concerning as wellI think it is too large to removal of local anesthesia.  I recommended at least deep sedation with probable general anesthesia/laryngeal mask airway.  We will see what anesthesia thinks.  Decubitus positioning given the fact it is on the left posterior side.  She has a busy schedule of traveling the next few   months.  We will try and coordinate a convenient time.   The pathophysiology of skin & subcutaneous masses was discussed.  Natural history risks without surgery were discussed.  I recommended surgery to remove the mass.  I explained the technique of removal with use of local anesthesia & possible need for more aggressive sedation/anesthesia for patient comfort.    Risks such as bleeding, infection, heart attack, death, and other risks were discussed.  I noted a good likelihood this will help address the problem.   Possibility that this will not correct all symptoms was explained. Possibility of regrowth/recurrence of the mass was discussed.  We will work to minimize complications. Questions were answered.  The patient expresses understanding & wishes to proceed with surgery.          

## 2013-02-20 NOTE — Interval H&P Note (Signed)
History and Physical Interval Note:  02/20/2013 7:29 AM  Amy [name]  has presented today for surgery, with the diagnosis of Subcutaneous mass       The various methods of treatment have been discussed with the patient and family. After consideration of risks, benefits and other options for treatment, the patient has consented to  Procedure(s): EXCISION LEFT BUTTOCK  MASS (Left) as a surgical intervention .  The patient's history has been reviewed, patient examined, no change in status, stable for surgery.  I have reviewed the patient's chart and labs.  Questions were answered to the patient's satisfaction.     GROSS,STEVEN C.

## 2013-02-20 NOTE — Anesthesia Postprocedure Evaluation (Signed)
  Anesthesia Post-op Note  Patient: Amy [name]  Procedure(s) Performed: Procedure(s) (LRB): EXCISION LEFT BUTTOCK  MASS (Left)  Patient Location: PACU  Anesthesia Type: General  Level of Consciousness: awake and alert   Airway and Oxygen Therapy: Patient Spontanous Breathing  Post-op Pain: mild  Post-op Assessment: Post-op Vital signs reviewed, Patient's Cardiovascular Status Stable, Respiratory Function Stable, Patent Airway and No signs of Nausea or vomiting  Last Vitals:  Filed Vitals:   02/20/13 1207  BP: 137/71  Pulse: 88  Temp: 37 C  Resp: 16    Post-op Vital Signs: stable   Complications: No apparent anesthesia complications

## 2013-02-20 NOTE — Anesthesia Preprocedure Evaluation (Addendum)
Anesthesia Evaluation  Patient identified by MRN, date of birth, ID band Patient awake    Reviewed: Allergy & Precautions, H&P , NPO status , Patient's Chart, lab work & pertinent test results  Airway Mallampati: II TM Distance: >3 FB Neck ROM: Full    Dental no notable dental hx.    Pulmonary sleep apnea ,  breath sounds clear to auscultation  Pulmonary exam normal       Cardiovascular hypertension, Pt. on medications Rhythm:Regular Rate:Normal     Neuro/Psych negative neurological ROS  negative psych ROS   GI/Hepatic negative GI ROS, Neg liver ROS,   Endo/Other  Morbid obesity  Renal/GU negative Renal ROS  negative genitourinary   Musculoskeletal negative musculoskeletal ROS (+)   Abdominal   Peds negative pediatric ROS (+)  Hematology negative hematology ROS (+)   Anesthesia Other Findings   Reproductive/Obstetrics negative OB ROS                          Anesthesia Physical Anesthesia Plan  ASA: III  Anesthesia Plan: General   Post-op Pain Management:    Induction: Intravenous  Airway Management Planned:   Additional Equipment:   Intra-op Plan:   Post-operative Plan: Extubation in OR  Informed Consent: I have reviewed the patients History and Physical, chart, labs and discussed the procedure including the risks, benefits and alternatives for the proposed anesthesia with the patient or authorized representative who has indicated his/her understanding and acceptance.   Dental advisory given  Plan Discussed with: CRNA  Anesthesia Plan Comments:         Anesthesia Quick Evaluation

## 2013-02-20 NOTE — Preoperative (Signed)
Beta Blockers   Reason not to administer Beta Blockers:Not Applicable 

## 2013-02-23 ENCOUNTER — Encounter (HOSPITAL_COMMUNITY): Payer: Self-pay | Admitting: Surgery

## 2013-02-24 ENCOUNTER — Telehealth (INDEPENDENT_AMBULATORY_CARE_PROVIDER_SITE_OTHER): Payer: Self-pay | Admitting: General Surgery

## 2013-02-24 NOTE — Telephone Encounter (Signed)
Message copied by Liliana Cline on Tue Feb 24, 2013 10:03 AM ------      Message from: Ardeth Sportsman      Created: Tue Feb 24, 2013  7:26 AM       Path c/w lipoma.  Tell pt the good news! ------

## 2013-02-24 NOTE — Telephone Encounter (Signed)
Patient made aware of path results. Will follow up at appt and call with any questions prior.  

## 2013-02-25 DIAGNOSIS — Z1211 Encounter for screening for malignant neoplasm of colon: Secondary | ICD-10-CM | POA: Diagnosis not present

## 2013-03-04 ENCOUNTER — Encounter (INDEPENDENT_AMBULATORY_CARE_PROVIDER_SITE_OTHER): Payer: Self-pay | Admitting: Surgery

## 2013-03-04 ENCOUNTER — Ambulatory Visit (INDEPENDENT_AMBULATORY_CARE_PROVIDER_SITE_OTHER): Payer: Medicare Other | Admitting: Surgery

## 2013-03-04 VITALS — BP 140/90 | HR 85 | Temp 97.0°F | Resp 17 | Ht 65.0 in | Wt 262.2 lb

## 2013-03-04 DIAGNOSIS — R229 Localized swelling, mass and lump, unspecified: Secondary | ICD-10-CM

## 2013-03-04 DIAGNOSIS — R2242 Localized swelling, mass and lump, left lower limb: Secondary | ICD-10-CM

## 2013-03-04 NOTE — Patient Instructions (Addendum)

## 2013-03-04 NOTE — Progress Notes (Signed)
Subjective:     Patient ID: Amy [name], female   DOB: 1946/07/19, 67 y.o.   MRN: [id number]  HPI  Amy [name]  [birth year] [id number]  Patient Care Team: Maurice Small, MD as PCP - General (Family Medicine)  This patient is a 67 y.o.female who presents today for surgical evaluation Status post removal of gluteal mass on left buttock 02/20/2013  Soft tissue mass, simple excision - BENIGN ADIPOSE TISSUE CONSISTENT WITH LIPOMA. Jimmy Picket MD Pathologist, Electronic Signature (Case signed 02/23/2013) Specimen Gross and Clinical Information Specimen(s) Obtained: Soft tissue mass, simple excision Specimen Clinical Information subcutaneous mass (kp) Gross Received in formalin and consists of a 8.7 x 8.5 x 3.8 cm piece of yellow pink lobulated adipose tissue. Sectioning reveals a partially encapsulated, lobulated yellow cut surface. Representative sections are submitted in one cassette. (DRP:gt, 02/21/13) Report signed out from the following location(s) [facility]  [facility] [address], [place], [place] [address]. CLIA #: [id number], 1 of  Patient comes in today feeling alright.  No pain - She only needed pain pills twice.  She was able to fly out to [place] to see her niece graduate from high school.  That went smoothly.  She is moving bowels fine.  Appetite is fine.  No fevers or chills.  Drainage output down to 20 mL a day  Patient Active Problem List   Diagnosis Date Noted  . Obesity, Class III, BMI 40-49.9 (morbid obesity) 02/03/2013  . Mass of left posterior buttock, SQ, 6x6x6cm 02/03/2013  . DYSPNEA 01/23/2008  . COUGH 01/23/2008  . OBSTRUCTIVE SLEEP APNEA 09/12/2007  . HYPERTENSION 09/12/2007  . ALLERGIC RHINITIS 09/12/2007  . SLEEPINESS 09/12/2007  . ANGINA, HX OF 09/12/2007    Past Medical History  Diagnosis Date  . Anxiety   . Sleep apnea     Dx.unable to tolerate cpap-no machine now  . Hypertension   . Dysphonia    intermittent "voice changes"  . Chronic back pain     steroid injection last 01-20-13 next 02-19-13  . GERD (gastroesophageal reflux disease)   . Arthritis     spinal fusion-lumbar; reains with chronic pain, steroid injections at present  . History of blood clots     tx. '89-Heparin and Coumadin-affected left arm    Past Surgical History  Procedure Laterality Date  . Abdominal hysterectomy    . Spinal fusion      Titanium hardware  . Nose surgery    . Bone graft hip iliac crest Right     for lumbar spinal surgery/fusion  . Back surgery  02-09-13    2012 - back surgery  . Mass excision Left 02/20/2013    Procedure: EXCISION LEFT BUTTOCK  MASS;  Surgeon: Ardeth Sportsman, MD;  Location: [facility];  Service: General;  Laterality: Left;  . Lipoma excision  01/2013    History   Social History  . Marital Status: Married    Spouse Name: N/A    Number of Children: N/A  . Years of Education: N/A   Occupational History  . Not on file.   Social History Main Topics  . Smoking status: Former Smoker    Quit date: 02/10/1975  . Smokeless tobacco: Never Used  . Alcohol Use: No  . Drug Use: No  . Sexually Active: Yes   Other Topics Concern  . Not on file   Social History Narrative  . No narrative on file    Family History  Problem Relation Age of Onset  .  Hypertension Mother   . Cancer Mother     cervix  . Kidney disease Mother   . Hypertension Sister   . Cancer Sister     polyps  . Kidney disease Brother   . Cancer Daughter     leukemia  . Hypertension Brother     Current Outpatient Prescriptions  Medication Sig Dispense Refill  . acetaminophen (TYLENOL) 500 MG tablet Take 1,000 mg by mouth every 8 (eight) hours as needed for pain.      . AZELEX 20 % cream       . calcium citrate-vitamin D (CITRACAL+D) 315-200 MG-UNIT per tablet Take 1 tablet by mouth daily.      . celecoxib (CELEBREX) 200 MG capsule Take 200 mg by mouth at bedtime.      . cholecalciferol (VITAMIN D)  1000 UNITS tablet Take 1,000 Units by mouth daily.      . clindamycin (CLEOCIN T) 1 % lotion       . diphenhydrAMINE (BENADRYL) 25 mg capsule Take 25 mg by mouth 2 (two) times daily as needed for allergies.      Marland Kitchen esomeprazole (NEXIUM) 40 MG capsule Take 40 mg by mouth daily before breakfast.      . felodipine (PLENDIL) 10 MG 24 hr tablet Take 10 mg by mouth every morning.       Marland Kitchen glycopyrrolate (ROBINUL) 2 MG tablet Take 2 mg by mouth 2 (two) times daily.       . imiquimod (ALDARA) 5 % cream Apply 1 application topically 3 (three) times a week. For 6 weeks      . losartan-hydrochlorothiazide (HYZAAR) 100-25 MG per tablet Take 1 tablet by mouth every morning.       . Multiple Vitamin (MULTIVITAMIN WITH MINERALS) TABS Take 1 tablet by mouth daily.      . naproxen (NAPROSYN) 500 MG tablet       . oxyCODONE (OXY IR/ROXICODONE) 5 MG immediate release tablet Take 1-2 tablets (5-10 mg total) by mouth every 4 (four) hours as needed for pain.  40 tablet  0  . PARoxetine (PAXIL) 30 MG tablet Take 30 mg by mouth at bedtime.       . Probiotic Product (PROBIOTIC DAILY PO) Take 1 capsule by mouth daily.      . silodosin (RAPAFLO) 8 MG CAPS capsule Take 8 mg by mouth daily as needed.      . Thiamine HCl (VITAMIN B-1 PO) Take 1 tablet by mouth daily.       . traMADol (ULTRAM-ER) 100 MG 24 hr tablet Take 100 mg by mouth daily as needed for pain.        No current facility-administered medications for this visit.     Allergies  Allergen Reactions  . Penicillins Rash  . Latex Other (See Comments)    Sores, blisters    BP 140/90  Pulse 85  Temp(Src) 97 F (36.1 C) (Temporal)  Resp 17  Ht 5\' 5"  (1.651 m)  Wt 262 lb 3.2 oz (118.933 kg)  BMI 43.63 kg/m2  SpO2 96%  Dg Chest 2 View  02/09/2013   *RADIOLOGY REPORT*  Clinical Data: Preop for resection of left buttock mass, short of breath, former smoking history  CHEST - 2 VIEW  Comparison: Chest x-ray of 06/08/2011  Findings: The lungs are clear.   Mediastinal contours appear normal. The heart is stable in size.  Lumbar spine hardware for fixation is noted.  IMPRESSION: Stable chest x-ray.  No active lung disease.  Original Report Authenticated By: Dwyane Dee, M.D.     Review of Systems  Constitutional: Negative for fever, chills and diaphoresis.  HENT: Negative for ear pain, sore throat and trouble swallowing.   Eyes: Negative for photophobia and visual disturbance.  Respiratory: Negative for cough and choking.   Cardiovascular: Negative for chest pain and palpitations.  Gastrointestinal: Negative for nausea, vomiting, abdominal pain, diarrhea, constipation, anal bleeding and rectal pain.  Genitourinary: Negative for dysuria, frequency and difficulty urinating.  Musculoskeletal: Negative for myalgias and gait problem.  Skin: Negative for color change, pallor and rash.  Neurological: Negative for dizziness, speech difficulty, weakness and numbness.  Hematological: Negative for adenopathy.  Psychiatric/Behavioral: Negative for confusion and agitation. The patient is not nervous/anxious.        Objective:   Physical Exam  Constitutional: She is oriented to person, place, and time. She appears well-developed and well-nourished. No distress.  HENT:  Head: Normocephalic.  Mouth/Throat: Oropharynx is clear and moist. No oropharyngeal exudate.  Eyes: Conjunctivae and EOM are normal. Pupils are equal, round, and reactive to light. No scleral icterus.  Neck: Normal range of motion. No tracheal deviation present.  Cardiovascular: Normal rate and intact distal pulses.   Pulmonary/Chest: Effort normal. No respiratory distress. She exhibits no tenderness.  Abdominal: Soft. She exhibits no distension. There is no tenderness. Hernia confirmed negative in the right inguinal area and confirmed negative in the left inguinal area.  Incisions clean with normal healing ridges.  No hernias  Genitourinary:    No vaginal discharge found.    Musculoskeletal: Normal range of motion. She exhibits no tenderness.  Lymphadenopathy:       Right: No inguinal adenopathy present.       Left: No inguinal adenopathy present.  Neurological: She is alert and oriented to person, place, and time. No cranial nerve deficit. She exhibits normal muscle tone. Coordination normal.  Skin: Skin is warm and dry. No rash noted. She is not diaphoretic.  Psychiatric: She has a normal mood and affect. Her behavior is normal.       Assessment:     Status post removal of buttock mass.  Pathology consistent with lipoma.     Plan:     Increase activity as tolerated to regular activity.  Low impact exercise such as walking an hour a day at least ideal.  Do not push through pain.  Diet as tolerated.  Low fat high fiber diet ideal.  Bowel regimen with 30 g fiber a day and fiber supplement as needed to avoid problems.  Return to clinic as needed.   Instructions discussed.  Followup with primary care physician for other health issues as would normally be done.  Questions answered.  The patient expressed understanding and appreciation

## 2013-04-13 ENCOUNTER — Other Ambulatory Visit: Payer: Self-pay | Admitting: Gastroenterology

## 2013-04-13 DIAGNOSIS — R1310 Dysphagia, unspecified: Secondary | ICD-10-CM

## 2013-04-13 DIAGNOSIS — R1031 Right lower quadrant pain: Secondary | ICD-10-CM | POA: Diagnosis not present

## 2013-04-13 DIAGNOSIS — K5900 Constipation, unspecified: Secondary | ICD-10-CM | POA: Diagnosis not present

## 2013-04-13 DIAGNOSIS — K219 Gastro-esophageal reflux disease without esophagitis: Secondary | ICD-10-CM | POA: Diagnosis not present

## 2013-04-13 DIAGNOSIS — K589 Irritable bowel syndrome without diarrhea: Secondary | ICD-10-CM | POA: Diagnosis not present

## 2013-04-21 ENCOUNTER — Ambulatory Visit
Admission: RE | Admit: 2013-04-21 | Discharge: 2013-04-21 | Disposition: A | Discharge status: Home / Self Care | Payer: Medicare Other | Source: Ambulatory Visit | Attending: Gastroenterology | Admitting: Gastroenterology

## 2013-04-21 DIAGNOSIS — K228 Other specified diseases of esophagus: Secondary | ICD-10-CM | POA: Diagnosis not present

## 2013-04-21 DIAGNOSIS — R1310 Dysphagia, unspecified: Secondary | ICD-10-CM

## 2013-04-21 DIAGNOSIS — K2289 Other specified disease of esophagus: Secondary | ICD-10-CM | POA: Diagnosis not present

## 2013-04-30 DIAGNOSIS — K219 Gastro-esophageal reflux disease without esophagitis: Secondary | ICD-10-CM | POA: Diagnosis not present

## 2013-04-30 DIAGNOSIS — R1310 Dysphagia, unspecified: Secondary | ICD-10-CM | POA: Diagnosis not present

## 2013-04-30 DIAGNOSIS — K589 Irritable bowel syndrome without diarrhea: Secondary | ICD-10-CM | POA: Diagnosis not present

## 2013-06-12 DIAGNOSIS — R1310 Dysphagia, unspecified: Secondary | ICD-10-CM | POA: Diagnosis not present

## 2013-06-12 DIAGNOSIS — N760 Acute vaginitis: Secondary | ICD-10-CM | POA: Diagnosis not present

## 2013-06-30 DIAGNOSIS — J384 Edema of larynx: Secondary | ICD-10-CM | POA: Diagnosis not present

## 2013-06-30 DIAGNOSIS — R490 Dysphonia: Secondary | ICD-10-CM | POA: Diagnosis not present

## 2013-07-06 DIAGNOSIS — R490 Dysphonia: Secondary | ICD-10-CM | POA: Diagnosis not present

## 2013-07-06 DIAGNOSIS — J385 Laryngeal spasm: Secondary | ICD-10-CM | POA: Diagnosis not present

## 2013-07-06 DIAGNOSIS — J384 Edema of larynx: Secondary | ICD-10-CM | POA: Diagnosis not present

## 2013-07-06 DIAGNOSIS — IMO0001 Reserved for inherently not codable concepts without codable children: Secondary | ICD-10-CM | POA: Diagnosis not present

## 2013-07-06 DIAGNOSIS — K219 Gastro-esophageal reflux disease without esophagitis: Secondary | ICD-10-CM | POA: Diagnosis not present

## 2013-07-07 ENCOUNTER — Other Ambulatory Visit: Payer: Self-pay | Admitting: Neurosurgery

## 2013-07-07 DIAGNOSIS — IMO0002 Reserved for concepts with insufficient information to code with codable children: Secondary | ICD-10-CM | POA: Diagnosis not present

## 2013-07-10 ENCOUNTER — Other Ambulatory Visit: Payer: Self-pay | Admitting: Neurosurgery

## 2013-07-10 DIAGNOSIS — IMO0002 Reserved for concepts with insufficient information to code with codable children: Secondary | ICD-10-CM

## 2013-07-13 ENCOUNTER — Other Ambulatory Visit: Payer: Self-pay | Admitting: Gastroenterology

## 2013-07-13 ENCOUNTER — Encounter (HOSPITAL_COMMUNITY): Payer: Self-pay | Admitting: Pharmacy Technician

## 2013-07-13 IMAGING — CR DG CHEST 2V
2 series · 2 of 2 positions shown · non-contrast
Comparison: Chest x-ray of 06/08/2011

CLINICAL DATA: Preop for resection of left buttock mass, short of
breath, former smoking history

CHEST - 2 VIEW

[w chest pa]
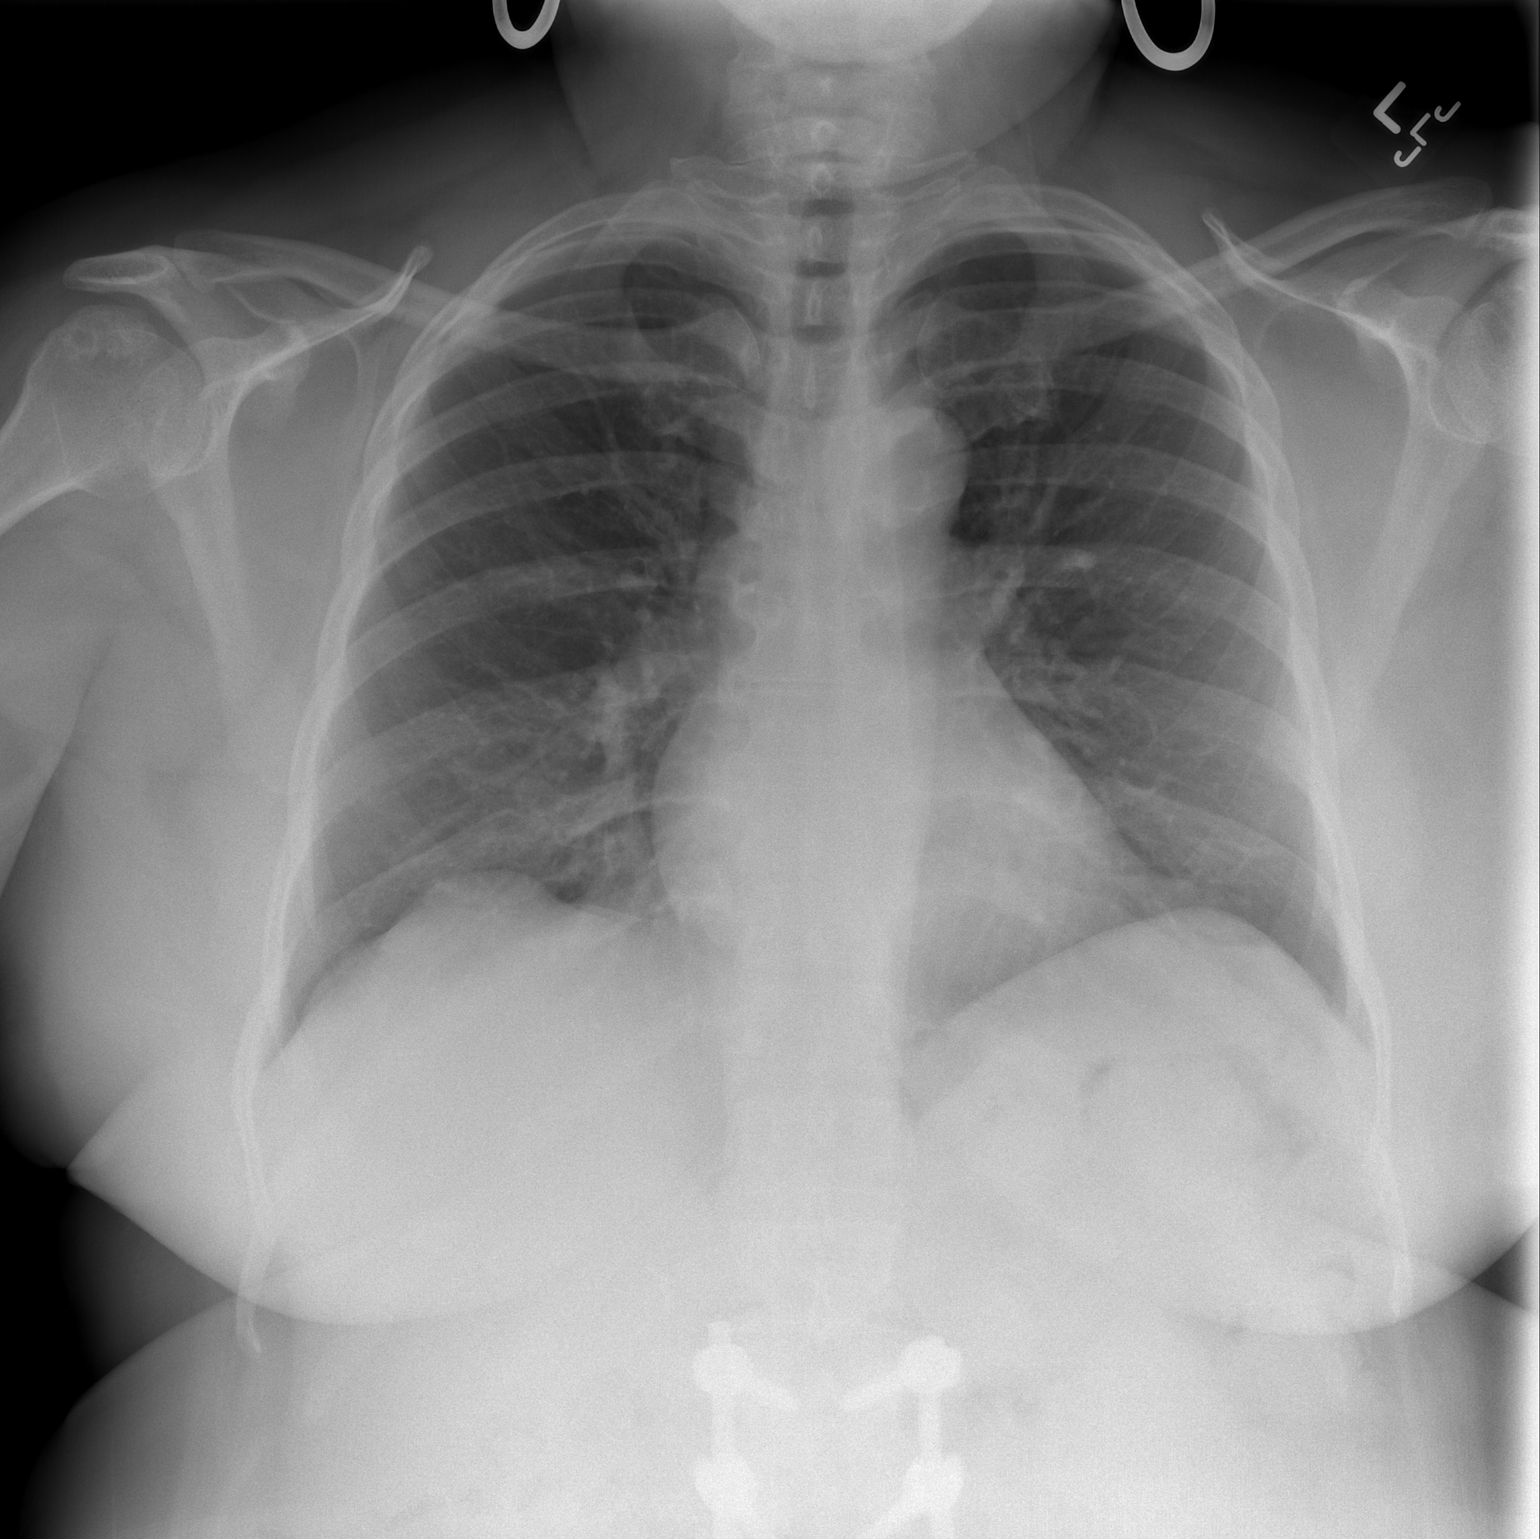

[w chest lat]
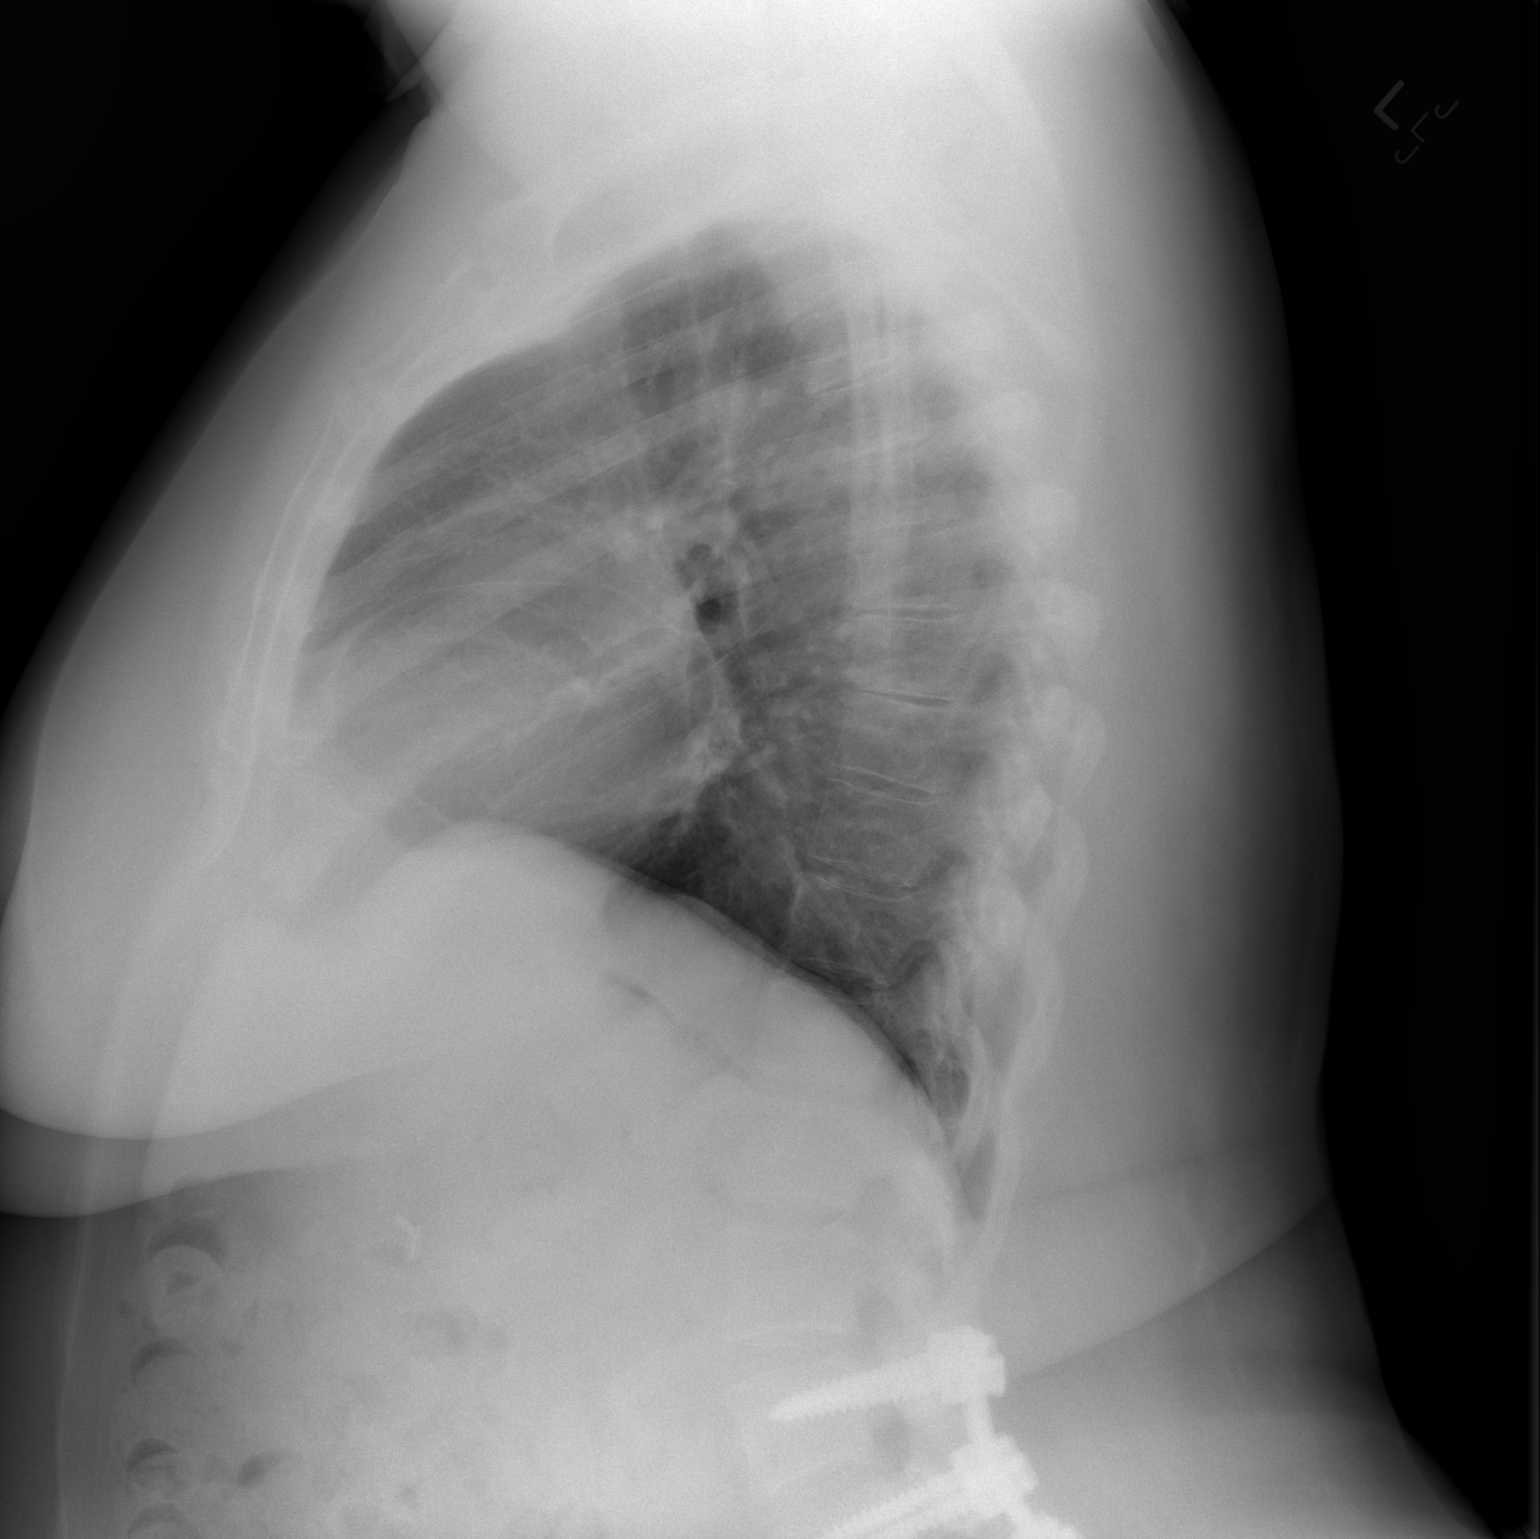

[2 of 2 positions shown; findings below may reference images not displayed]

FINDINGS: The lungs are clear.  Mediastinal contours appear normal.
The heart is stable in size.  Lumbar spine hardware for fixation is
noted.
IMPRESSION: Stable chest x-ray.  No active lung disease.

## 2013-07-13 NOTE — Addendum Note (Signed)
Addended by: Samuel Germany. on: 07/13/2013 09:17 AM   Modules accepted: Orders

## 2013-07-15 ENCOUNTER — Ambulatory Visit (HOSPITAL_COMMUNITY)
Admission: RE | Admit: 2013-07-15 | Discharge: 2013-07-15 | Disposition: A | Discharge status: Home / Self Care | Payer: Medicare Other | Source: Ambulatory Visit | Attending: Gastroenterology | Admitting: Gastroenterology

## 2013-07-15 ENCOUNTER — Encounter (HOSPITAL_COMMUNITY)
Admission: RE | Disposition: A | Discharge status: Home / Self Care | Payer: Self-pay | Source: Ambulatory Visit | Attending: Gastroenterology

## 2013-07-15 ENCOUNTER — Encounter (HOSPITAL_COMMUNITY): Payer: Self-pay | Admitting: *Deleted

## 2013-07-15 DIAGNOSIS — R1310 Dysphagia, unspecified: Secondary | ICD-10-CM | POA: Diagnosis not present

## 2013-07-15 DIAGNOSIS — G4733 Obstructive sleep apnea (adult) (pediatric): Secondary | ICD-10-CM | POA: Diagnosis not present

## 2013-07-15 DIAGNOSIS — K219 Gastro-esophageal reflux disease without esophagitis: Secondary | ICD-10-CM | POA: Diagnosis not present

## 2013-07-15 DIAGNOSIS — Z79899 Other long term (current) drug therapy: Secondary | ICD-10-CM | POA: Diagnosis not present

## 2013-07-15 DIAGNOSIS — I10 Essential (primary) hypertension: Secondary | ICD-10-CM | POA: Diagnosis not present

## 2013-07-15 DIAGNOSIS — K224 Dyskinesia of esophagus: Secondary | ICD-10-CM | POA: Diagnosis not present

## 2013-07-15 DIAGNOSIS — Z6841 Body Mass Index (BMI) 40.0 and over, adult: Secondary | ICD-10-CM | POA: Diagnosis not present

## 2013-07-15 HISTORY — PX: ESOPHAGOGASTRODUODENOSCOPY: SHX5428

## 2013-07-15 SURGERY — EGD (ESOPHAGOGASTRODUODENOSCOPY)
Anesthesia: Moderate Sedation

## 2013-07-15 MED ORDER — BUTAMBEN-TETRACAINE-BENZOCAINE 2-2-14 % EX AERO
INHALATION_SPRAY | CUTANEOUS | Status: DC | PRN
  Administered 2013-07-15: 2 via TOPICAL

## 2013-07-15 MED ORDER — FENTANYL CITRATE 0.05 MG/ML IJ SOLN
INTRAMUSCULAR | Status: AC
  Filled 2013-07-15: qty 2

## 2013-07-15 MED ORDER — MIDAZOLAM HCL 10 MG/2ML IJ SOLN
INTRAMUSCULAR | Status: AC
  Filled 2013-07-15: qty 2

## 2013-07-15 MED ORDER — SODIUM CHLORIDE 0.9 % IV SOLN
INTRAVENOUS | Status: DC
  Administered 2013-07-15: 09:00:00 via INTRAVENOUS

## 2013-07-15 MED ORDER — MIDAZOLAM HCL 10 MG/2ML IJ SOLN
INTRAMUSCULAR | Status: DC | PRN
  Administered 2013-07-15 (×2): 2 mg via INTRAVENOUS
  Administered 2013-07-15: 1 mg via INTRAVENOUS

## 2013-07-15 MED ORDER — FENTANYL CITRATE 0.05 MG/ML IJ SOLN
INTRAMUSCULAR | Status: DC | PRN
  Administered 2013-07-15 (×3): 25 ug via INTRAVENOUS

## 2013-07-15 NOTE — Op Note (Signed)
[facility] [address] [place], [address]   ENDOSCOPY PROCEDURE REPORT  PATIENT: Amy, [name]  MR#: [id number] BIRTHDATE: [birth year] , 67  yrs. old GENDER: Female ENDOSCOPIST:James Randa Evens, MD REFERRED BY:  Dr. Maurice Small PROCEDURE DATE:  07/15/2013 PROCEDURE:  EGD ASA CLASS:  class III INDICATIONS:   patient with dysphagia with barium swallow suggesting poor motility with possible narrowing the distal cervical esophagus MEDICATION:    fentanyl 75 mcg, versed 5 mg IV TOPICAL ANESTHETIC:    cetacaine spray  DESCRIPTION OF PROCEDURE:   The Pentax pediatric upper scope was inserted into the esophagus with swallowing. There was a large amount of esophageal spasm multiple passes failed to reveal any stricturing in either the upper or lower esophagus. There was minimal hiatal hernia. The GE junction was patent. The scope passed into the stomach and the duodenum including the bulb and 2nd portion were normal. The antrum and body of the stomach were normal. The scope was withdrawn back into the esophagus in the esophagus is vigorously watched there was no narrowing in all throughout the esophagus is fixed but a fair amount of esophageal spasm was seen.     COMPLICATIONS: None  ENDOSCOPIC IMPRESSION: 1. Dysphagia. Probably due to esophageal spasm. Despite the suggestion of proximal narrowing: barium swallow there is no sign of thick stricturing throughout the esophagus. No dilatation was performed.  RECOMMENDATIONS: 1. We'll continue on her current medications. Will plan on seeing her back in the office in the next 2 to 3 months.    _______________________________ eSigned:  Carman Ching, MD 07/15/2013 11:05 AM  CC: Dr Margretta Ditty

## 2013-07-15 NOTE — H&P (Signed)
Subjective:   Patient is a 67 y.o. female presents with problems swallowing. She is here to have esophageal dilatation. The barium swallow showed abnormal tertiary contractions with no clear stricture the distal esophagus but possibly a web were narrowing in the upper cervical esophagus area. Procedure including risks and benefits discussed in office.  Patient Active Problem List   Diagnosis Date Noted  . Obesity, Class III, BMI 40-49.9 (morbid obesity) 02/03/2013  . Lipoma of buttock s/p excision 02/20/2013 02/03/2013  . DYSPNEA 01/23/2008  . COUGH 01/23/2008  . OBSTRUCTIVE SLEEP APNEA 09/12/2007  . HYPERTENSION 09/12/2007  . ALLERGIC RHINITIS 09/12/2007  . SLEEPINESS 09/12/2007  . ANGINA, HX OF 09/12/2007   Past Medical History  Diagnosis Date  . Anxiety   . Sleep apnea     Dx.unable to tolerate cpap-no machine now  . Hypertension   . Dysphonia     intermittent "voice changes"  . Chronic back pain     steroid injection last 01-20-13 next 02-19-13  . GERD (gastroesophageal reflux disease)   . Arthritis     spinal fusion-lumbar; reains with chronic pain, steroid injections at present  . History of blood clots     tx. '89-Heparin and Coumadin-affected left arm    Past Surgical History  Procedure Laterality Date  . Abdominal hysterectomy    . Spinal fusion      Titanium hardware  . Nose surgery    . Bone graft hip iliac crest Right     for lumbar spinal surgery/fusion  . Back surgery  02-09-13    2012 - back surgery  . Mass excision Left 02/20/2013    Procedure: EXCISION LEFT BUTTOCK  MASS;  Surgeon: Ardeth Sportsman, MD;  Location: [facility];  Service: General;  Laterality: Left;  . Lipoma excision  01/2013    Prescriptions prior to admission  Medication Sig Dispense Refill  . acetaminophen (TYLENOL) 500 MG tablet Take 1,000 mg by mouth every 8 (eight) hours as needed for pain.      Marland Kitchen azelaic acid (AZELEX) 20 % cream Apply 1 application topically 2 (two) times daily. After  skin is thoroughly washed and patted dry, gently but thoroughly massage a thin film of azelaic acid cream into the affected area twice daily, in the morning and evening.      . calcium citrate-vitamin D (CITRACAL+D) 315-200 MG-UNIT per tablet Take 1 tablet by mouth daily.      . celecoxib (CELEBREX) 200 MG capsule Take 200 mg by mouth at bedtime.      . cholecalciferol (VITAMIN D) 1000 UNITS tablet Take 1,000 Units by mouth daily.      . diphenhydrAMINE (BENADRYL) 25 mg capsule Take 25 mg by mouth 2 (two) times daily as needed for allergies.      Marland Kitchen esomeprazole (NEXIUM) 40 MG capsule Take 40 mg by mouth 2 (two) times daily.       . felodipine (PLENDIL) 10 MG 24 hr tablet Take 10 mg by mouth every morning.       Marland Kitchen glycopyrrolate (ROBINUL) 2 MG tablet Take 2 mg by mouth 2 (two) times daily.       Marland Kitchen losartan-hydrochlorothiazide (HYZAAR) 100-25 MG per tablet Take 1 tablet by mouth every morning.       . Multiple Vitamin (MULTIVITAMIN WITH MINERALS) TABS Take 1 tablet by mouth daily.      Marland Kitchen PARoxetine (PAXIL) 30 MG tablet Take 30 mg by mouth at bedtime.       . silodosin (  RAPAFLO) 8 MG CAPS capsule Take 8 mg by mouth daily as needed.      . Thiamine HCl (VITAMIN B-1 PO) Take 1 tablet by mouth daily.       . traMADol (ULTRAM-ER) 100 MG 24 hr tablet Take 100 mg by mouth daily as needed for pain.       . vitamin E (VITAMIN E) 400 UNIT capsule Take 400 Units by mouth daily.       Allergies  Allergen Reactions  . Penicillins Rash  . Latex Other (See Comments)    Sores, blisters    History  Substance Use Topics  . Smoking status: Former Smoker    Quit date: 02/10/1975  . Smokeless tobacco: Never Used  . Alcohol Use: No    Family History  Problem Relation Age of Onset  . Hypertension Mother   . Cancer Mother     cervix  . Kidney disease Mother   . Hypertension Sister   . Cancer Sister     polyps  . Kidney disease Brother   . Cancer Daughter     leukemia  . Hypertension Brother       Objective:   Patient Vitals for the past 8 hrs:  BP Temp Temp src Pulse Resp SpO2 Height Weight  07/15/13 1028 191/108 mmHg - - - 19 97 % - -  07/15/13 0847 164/94 mmHg 98.7 F (37.1 C) Oral 83 15 95 % 5' 5.5" (1.664 m) 115.667 kg (255 lb)         See MD Preop evaluation      Assessment:   1. Dysphagia. 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 Procedure including the risk and benefits have been discussed with the patient

## 2013-07-16 ENCOUNTER — Encounter (HOSPITAL_COMMUNITY): Payer: Self-pay | Admitting: Gastroenterology

## 2013-07-17 ENCOUNTER — Ambulatory Visit
Admission: RE | Admit: 2013-07-17 | Discharge: 2013-07-17 | Disposition: A | Discharge status: Home / Self Care | Payer: Medicare Other | Source: Ambulatory Visit | Attending: Neurosurgery | Admitting: Neurosurgery

## 2013-07-17 DIAGNOSIS — M539 Dorsopathy, unspecified: Secondary | ICD-10-CM | POA: Diagnosis not present

## 2013-07-17 DIAGNOSIS — IMO0002 Reserved for concepts with insufficient information to code with codable children: Secondary | ICD-10-CM

## 2013-07-22 ENCOUNTER — Encounter (HOSPITAL_COMMUNITY)
Admission: RE | Admit: 2013-07-22 | Discharge: 2013-07-22 | Disposition: A | Discharge status: Home / Self Care | Payer: Medicare Other | Source: Ambulatory Visit | Attending: Neurosurgery | Admitting: Neurosurgery

## 2013-07-22 ENCOUNTER — Encounter (HOSPITAL_COMMUNITY): Payer: Self-pay

## 2013-07-22 DIAGNOSIS — Z01812 Encounter for preprocedural laboratory examination: Secondary | ICD-10-CM | POA: Insufficient documentation

## 2013-07-22 DIAGNOSIS — Z01818 Encounter for other preprocedural examination: Secondary | ICD-10-CM | POA: Diagnosis not present

## 2013-07-22 HISTORY — DX: Fibromyalgia: M79.7

## 2013-07-22 HISTORY — DX: Constipation, unspecified: K59.00

## 2013-07-22 HISTORY — DX: Weakness: R53.1

## 2013-07-22 HISTORY — DX: Depression, unspecified: F32.A

## 2013-07-22 HISTORY — DX: Personal history of Methicillin resistant Staphylococcus aureus infection: Z86.14

## 2013-07-22 HISTORY — DX: Other seasonal allergic rhinitis: J30.2

## 2013-07-22 HISTORY — DX: Frequency of micturition: R35.0

## 2013-07-22 HISTORY — DX: Unspecified glaucoma: H40.9

## 2013-07-22 HISTORY — DX: Major depressive disorder, single episode, unspecified: F32.9

## 2013-07-22 HISTORY — DX: Unspecified asthma, uncomplicated: J45.909

## 2013-07-22 LAB — BASIC METABOLIC PANEL
BUN: 17 mg/dL (ref 6–23)
CO2: 27 mEq/L (ref 19–32)
Calcium: 9.3 mg/dL (ref 8.4–10.5)
Chloride: 99 mEq/L (ref 96–112)
Creatinine, Ser: 0.95 mg/dL (ref 0.50–1.10)
GFR calc Af Amer: 70 mL/min — ABNORMAL LOW (ref >90)
GFR calc non Af Amer: 61 mL/min — ABNORMAL LOW (ref >90)
Glucose, Bld: 107 mg/dL — ABNORMAL HIGH (ref 70–99)
Potassium: 3.6 mEq/L (ref 3.5–5.1)
Sodium: 138 mEq/L (ref 135–145)

## 2013-07-22 LAB — TYPE AND SCREEN
ABO/RH(D): B POS
Antibody Screen: NEGATIVE

## 2013-07-22 LAB — CBC
HCT: 39.6 % (ref 36.0–46.0)
Hemoglobin: 13.6 g/dL (ref 12.0–15.0)
MCH: 30.9 pg (ref 26.0–34.0)
MCHC: 34.3 g/dL (ref 30.0–36.0)
MCV: 90 fL (ref 78.0–100.0)
Platelets: 326 10*3/uL (ref 150–400)
RBC: 4.4 MIL/uL (ref 3.87–5.11)
RDW: 14 % (ref 11.5–15.5)
WBC: 4.3 10*3/uL (ref 4.0–10.5)

## 2013-07-22 LAB — SURGICAL PCR SCREEN
MRSA, PCR: NEGATIVE
Staphylococcus aureus: NEGATIVE

## 2013-07-22 NOTE — Pre-Procedure Instructions (Signed)
Amy [name]  07/22/2013   Your procedure is scheduled on:  Mon, Nov 3 @ 9:30 AM  Report to [facility] Short Stay [facility]trance A at 7:30 AM.  Call this number if you have problems the morning of surgery: [phone]   Remember:   Do not eat food or drink liquids after midnight.   Take these medicines the morning of surgery with A SIP OF WATER: Nexium(Esomeprazole),Silodosin(Rapaflo-if needed),Felodipine(Plendil),and Tramadol(Ultram-if needed)               No Goody's,BC's,Aleve,Aspirin,Ibuprofen,Fish Oil,or any Herbal Medications   Do not wear jewelry, make-up or nail polish.  Do not wear lotions, powders, or perfumes. You may wear deodorant.  Do not shave 48 hours prior to surgery.   Do not bring valuables to the hospital.  [facility] is not responsible                  for any belongings or valuables.               Contacts, dentures or bridgework may not be worn into surgery.  Leave suitcase in the car. After surgery it may be brought to your room.  For patients admitted to the hospital, discharge time is determined by your                treatment team.               Patients discharged the day of surgery will not be allowed to drive  home.    Special Instructions: Shower using CHG 2 nights before surgery and the night before surgery.  If you shower the day of surgery use CHG.  Use special wash - you have one bottle of CHG for all showers.  You should use approximately 1/3 of the bottle for each shower.   Please read over the following fact sheets that you were given: Pain Booklet, Coughing and Deep Breathing, Blood Transfusion Information, MRSA Information and Surgical Site Infection Prevention

## 2013-07-22 NOTE — Progress Notes (Addendum)
Saw a cardiologist about 66yrs ago but unsure of who she saw  Stress test done >60yrs ago  Denies ever having an echo or heart cath  Medical Md is Dr.Elaine Valentina Lucks

## 2013-07-22 NOTE — Progress Notes (Signed)
  EKG and CXR in epic from 02-09-13    Sleep study in epic from 2007

## 2013-07-23 ENCOUNTER — Encounter (HOSPITAL_COMMUNITY): Payer: Self-pay

## 2013-07-23 ENCOUNTER — Encounter (HOSPITAL_COMMUNITY): Payer: Self-pay | Admitting: Pharmacy Technician

## 2013-07-23 NOTE — Progress Notes (Signed)
Anesthesia Chart Review:  Patient is a 67 year old female scheduled for one level PLIF on 07/27/13 by Dr. Wynetta Emery. History includes non-smoker, morbid obesity, GERD, fibromyalgia, OSA without CPAP use, HTN, anxiety, asthma, glaucoma, depression, LUE DVT '89, prior spinal fusion, nasal surgery.  She reported that she is prone to "throat spasms" related to reflux.  She denied any known anesthesia complications. PCP is Dr. Maurice Small.  Most recent EKG, CXR, and labs in Epic reviewed.  Anticipate that she can proceed as planned.  Velna Ochs [facility] Short Stay Center/Anesthesiology Phone [phone] 07/23/2013 11:39 AM

## 2013-07-27 ENCOUNTER — Inpatient Hospital Stay (HOSPITAL_COMMUNITY): Payer: Medicare Other

## 2013-07-27 ENCOUNTER — Encounter (HOSPITAL_COMMUNITY): Payer: Medicare Other | Admitting: Vascular Surgery

## 2013-07-27 ENCOUNTER — Encounter (HOSPITAL_COMMUNITY): Payer: Self-pay | Admitting: *Deleted

## 2013-07-27 ENCOUNTER — Encounter (HOSPITAL_COMMUNITY)
Admission: RE | Disposition: A | Discharge status: Skilled Nursing Facility | Payer: Self-pay | Source: Ambulatory Visit | Attending: Neurosurgery

## 2013-07-27 ENCOUNTER — Inpatient Hospital Stay (HOSPITAL_COMMUNITY)
Admission: RE | Admit: 2013-07-27 | Discharge: 2013-07-31 | DRG: 460 | Disposition: A | Discharge status: Skilled Nursing Facility | Payer: Medicare Other | Source: Ambulatory Visit | Attending: Neurosurgery | Admitting: Neurosurgery

## 2013-07-27 ENCOUNTER — Inpatient Hospital Stay (HOSPITAL_COMMUNITY): Payer: Medicare Other | Admitting: Anesthesiology

## 2013-07-27 DIAGNOSIS — T84498A Other mechanical complication of other internal orthopedic devices, implants and grafts, initial encounter: Secondary | ICD-10-CM | POA: Diagnosis not present

## 2013-07-27 DIAGNOSIS — Y831 Surgical operation with implant of artificial internal device as the cause of abnormal reaction of the patient, or of later complication, without mention of misadventure at the time of the procedure: Secondary | ICD-10-CM | POA: Diagnosis present

## 2013-07-27 DIAGNOSIS — H409 Unspecified glaucoma: Secondary | ICD-10-CM | POA: Diagnosis not present

## 2013-07-27 DIAGNOSIS — M549 Dorsalgia, unspecified: Secondary | ICD-10-CM | POA: Diagnosis not present

## 2013-07-27 DIAGNOSIS — M6281 Muscle weakness (generalized): Secondary | ICD-10-CM | POA: Diagnosis not present

## 2013-07-27 DIAGNOSIS — I10 Essential (primary) hypertension: Secondary | ICD-10-CM | POA: Diagnosis present

## 2013-07-27 DIAGNOSIS — R279 Unspecified lack of coordination: Secondary | ICD-10-CM | POA: Diagnosis not present

## 2013-07-27 DIAGNOSIS — Z981 Arthrodesis status: Secondary | ICD-10-CM | POA: Diagnosis not present

## 2013-07-27 DIAGNOSIS — K219 Gastro-esophageal reflux disease without esophagitis: Secondary | ICD-10-CM | POA: Diagnosis present

## 2013-07-27 DIAGNOSIS — J45909 Unspecified asthma, uncomplicated: Secondary | ICD-10-CM | POA: Diagnosis not present

## 2013-07-27 DIAGNOSIS — M129 Arthropathy, unspecified: Secondary | ICD-10-CM | POA: Diagnosis not present

## 2013-07-27 DIAGNOSIS — IMO0002 Reserved for concepts with insufficient information to code with codable children: Secondary | ICD-10-CM | POA: Diagnosis not present

## 2013-07-27 DIAGNOSIS — IMO0001 Reserved for inherently not codable concepts without codable children: Secondary | ICD-10-CM | POA: Diagnosis not present

## 2013-07-27 DIAGNOSIS — Z6841 Body Mass Index (BMI) 40.0 and over, adult: Secondary | ICD-10-CM

## 2013-07-27 DIAGNOSIS — F329 Major depressive disorder, single episode, unspecified: Secondary | ICD-10-CM | POA: Diagnosis not present

## 2013-07-27 DIAGNOSIS — Z4789 Encounter for other orthopedic aftercare: Secondary | ICD-10-CM | POA: Diagnosis not present

## 2013-07-27 DIAGNOSIS — F411 Generalized anxiety disorder: Secondary | ICD-10-CM | POA: Diagnosis not present

## 2013-07-27 DIAGNOSIS — R269 Unspecified abnormalities of gait and mobility: Secondary | ICD-10-CM | POA: Diagnosis not present

## 2013-07-27 DIAGNOSIS — R490 Dysphonia: Secondary | ICD-10-CM | POA: Diagnosis not present

## 2013-07-27 SURGERY — POSTERIOR LUMBAR FUSION 1 WITH HARDWARE REMOVAL
Anesthesia: General | Site: Back | Wound class: Clean

## 2013-07-27 MED ORDER — CALCIUM CITRATE-VITAMIN D 500-400 MG-UNIT PO CHEW
1.0000 | CHEWABLE_TABLET | Freq: Every day | ORAL | Status: DC
  Filled 2013-07-27: qty 1

## 2013-07-27 MED ORDER — TRAMADOL HCL 50 MG PO TABS
50.0000 mg | ORAL_TABLET | Freq: Two times a day (BID) | ORAL | Status: DC
  Administered 2013-07-28 – 2013-07-30 (×2): 50 mg via ORAL
  Filled 2013-07-27 (×4): qty 1

## 2013-07-27 MED ORDER — OXYCODONE-ACETAMINOPHEN 5-325 MG PO TABS
1.0000 | ORAL_TABLET | ORAL | Status: DC | PRN
  Administered 2013-07-27 – 2013-07-28 (×3): 2 via ORAL
  Administered 2013-07-28: 1 via ORAL
  Administered 2013-07-28 – 2013-07-29 (×3): 2 via ORAL
  Administered 2013-07-30: 1 via ORAL
  Administered 2013-07-30 – 2013-07-31 (×3): 2 via ORAL
  Administered 2013-07-31: 1 via ORAL
  Filled 2013-07-27 (×3): qty 2
  Filled 2013-07-27: qty 1
  Filled 2013-07-27: qty 2
  Filled 2013-07-27: qty 1
  Filled 2013-07-27 (×3): qty 2
  Filled 2013-07-27: qty 1
  Filled 2013-07-27 (×2): qty 2

## 2013-07-27 MED ORDER — VITAMIN E 180 MG (400 UNIT) PO CAPS
400.0000 [IU] | ORAL_CAPSULE | Freq: Every day | ORAL | Status: DC
  Administered 2013-07-27 – 2013-07-31 (×5): 400 [IU] via ORAL
  Filled 2013-07-27 (×5): qty 1

## 2013-07-27 MED ORDER — LACTATED RINGERS IV SOLN
INTRAVENOUS | Status: DC | PRN
  Administered 2013-07-27 (×3): via INTRAVENOUS

## 2013-07-27 MED ORDER — ADULT MULTIVITAMIN W/MINERALS CH
1.0000 | ORAL_TABLET | Freq: Every day | ORAL | Status: DC
  Administered 2013-07-27 – 2013-07-31 (×5): 1 via ORAL
  Filled 2013-07-27 (×5): qty 1

## 2013-07-27 MED ORDER — SODIUM CHLORIDE 0.9 % IV SOLN
250.0000 mL | INTRAVENOUS | Status: DC
  Administered 2013-07-27: 250 mL via INTRAVENOUS

## 2013-07-27 MED ORDER — AZELAIC ACID 20 % EX CREA: 1.0000 "application " | TOPICAL_CREAM | Freq: Two times a day (BID) | CUTANEOUS | Status: DC

## 2013-07-27 MED ORDER — ONDANSETRON HCL 4 MG/2ML IJ SOLN
INTRAMUSCULAR | Status: DC | PRN
  Administered 2013-07-27: 4 mg via INTRAVENOUS

## 2013-07-27 MED ORDER — METOCLOPRAMIDE HCL 5 MG/ML IJ SOLN: 10.0000 mg | Freq: Once | INTRAMUSCULAR | Status: DC | PRN

## 2013-07-27 MED ORDER — PAROXETINE HCL 30 MG PO TABS
30.0000 mg | ORAL_TABLET | Freq: Every day | ORAL | Status: DC
  Administered 2013-07-27 – 2013-07-30 (×4): 30 mg via ORAL
  Filled 2013-07-27 (×5): qty 1

## 2013-07-27 MED ORDER — GLYCOPYRROLATE 0.2 MG/ML IJ SOLN
INTRAMUSCULAR | Status: DC | PRN
  Administered 2013-07-27: 0.4 mg via INTRAVENOUS

## 2013-07-27 MED ORDER — ACETAMINOPHEN 650 MG RE SUPP: 650.0000 mg | RECTAL | Status: DC | PRN

## 2013-07-27 MED ORDER — LIDOCAINE-EPINEPHRINE 1 %-1:100000 IJ SOLN
INTRAMUSCULAR | Status: DC | PRN
  Administered 2013-07-27: 10 mL

## 2013-07-27 MED ORDER — LOSARTAN POTASSIUM 50 MG PO TABS
100.0000 mg | ORAL_TABLET | Freq: Every day | ORAL | Status: DC
  Administered 2013-07-29 – 2013-07-31 (×3): 100 mg via ORAL
  Filled 2013-07-27 (×4): qty 2

## 2013-07-27 MED ORDER — VANCOMYCIN HCL IN DEXTROSE 1-5 GM/200ML-% IV SOLN
1000.0000 mg | Freq: Two times a day (BID) | INTRAVENOUS | Status: DC
  Administered 2013-07-27 – 2013-07-31 (×8): 1000 mg via INTRAVENOUS
  Filled 2013-07-27 (×10): qty 200

## 2013-07-27 MED ORDER — CELECOXIB 200 MG PO CAPS
200.0000 mg | ORAL_CAPSULE | Freq: Every day | ORAL | Status: DC
  Administered 2013-07-27 – 2013-07-30 (×4): 200 mg via ORAL
  Filled 2013-07-27 (×5): qty 1

## 2013-07-27 MED ORDER — HYDROMORPHONE HCL PF 1 MG/ML IJ SOLN
0.5000 mg | INTRAMUSCULAR | Status: DC | PRN
  Administered 2013-07-27: 1 mg via INTRAVENOUS
  Filled 2013-07-27: qty 1

## 2013-07-27 MED ORDER — ALUM & MAG HYDROXIDE-SIMETH 200-200-20 MG/5ML PO SUSP: 30.0000 mL | Freq: Four times a day (QID) | ORAL | Status: DC | PRN

## 2013-07-27 MED ORDER — DEXAMETHASONE SODIUM PHOSPHATE 4 MG/ML IJ SOLN
INTRAMUSCULAR | Status: DC | PRN
  Administered 2013-07-27: 10 mg via INTRAVENOUS

## 2013-07-27 MED ORDER — VITAMIN D3 25 MCG (1000 UNIT) PO TABS
1000.0000 [IU] | ORAL_TABLET | Freq: Every day | ORAL | Status: DC
  Administered 2013-07-27 – 2013-07-31 (×5): 1000 [IU] via ORAL
  Filled 2013-07-27 (×5): qty 1

## 2013-07-27 MED ORDER — DIPHENHYDRAMINE HCL 25 MG PO CAPS: 25.0000 mg | ORAL_CAPSULE | Freq: Two times a day (BID) | ORAL | Status: DC | PRN

## 2013-07-27 MED ORDER — OXYCODONE HCL 5 MG PO TABS: 5.0000 mg | ORAL_TABLET | Freq: Once | ORAL | Status: DC | PRN

## 2013-07-27 MED ORDER — MIDAZOLAM HCL 5 MG/5ML IJ SOLN
INTRAMUSCULAR | Status: DC | PRN
  Administered 2013-07-27: 1 mg via INTRAVENOUS

## 2013-07-27 MED ORDER — NEOSTIGMINE METHYLSULFATE 1 MG/ML IJ SOLN
INTRAMUSCULAR | Status: DC | PRN
  Administered 2013-07-27: 3 mg via INTRAVENOUS

## 2013-07-27 MED ORDER — MENTHOL 3 MG MT LOZG: 1.0000 | LOZENGE | OROMUCOSAL | Status: DC | PRN

## 2013-07-27 MED ORDER — ROCURONIUM BROMIDE 100 MG/10ML IV SOLN
INTRAVENOUS | Status: DC | PRN
  Administered 2013-07-27: 50 mg via INTRAVENOUS

## 2013-07-27 MED ORDER — PANTOPRAZOLE SODIUM 40 MG PO TBEC
40.0000 mg | DELAYED_RELEASE_TABLET | Freq: Every day | ORAL | Status: DC
  Administered 2013-07-28 – 2013-07-31 (×4): 40 mg via ORAL
  Filled 2013-07-27 (×4): qty 1

## 2013-07-27 MED ORDER — PROPOFOL 10 MG/ML IV BOLUS
INTRAVENOUS | Status: DC | PRN
  Administered 2013-07-27: 200 mg via INTRAVENOUS

## 2013-07-27 MED ORDER — HYDROCHLOROTHIAZIDE 25 MG PO TABS
25.0000 mg | ORAL_TABLET | Freq: Every day | ORAL | Status: DC
  Administered 2013-07-29 – 2013-07-31 (×3): 25 mg via ORAL
  Filled 2013-07-27 (×4): qty 1

## 2013-07-27 MED ORDER — FELODIPINE ER 10 MG PO TB24
10.0000 mg | ORAL_TABLET | Freq: Every morning | ORAL | Status: DC
  Administered 2013-07-29 – 2013-07-31 (×3): 10 mg via ORAL
  Filled 2013-07-27 (×4): qty 1

## 2013-07-27 MED ORDER — SODIUM CHLORIDE 0.9 % IJ SOLN: 3.0000 mL | INTRAMUSCULAR | Status: DC | PRN

## 2013-07-27 MED ORDER — SODIUM CHLORIDE 0.9 % IJ SOLN
3.0000 mL | Freq: Two times a day (BID) | INTRAMUSCULAR | Status: DC
  Administered 2013-07-28 – 2013-07-31 (×4): 3 mL via INTRAVENOUS

## 2013-07-27 MED ORDER — LACTATED RINGERS IV SOLN
INTRAVENOUS | Status: DC
  Administered 2013-07-27: 20:00:00 via INTRAVENOUS
  Administered 2013-07-27: 50 mL/h via INTRAVENOUS
  Administered 2013-07-28 – 2013-07-29 (×2): via INTRAVENOUS

## 2013-07-27 MED ORDER — SODIUM CHLORIDE 0.9 % IR SOLN
Status: DC | PRN
  Administered 2013-07-27: 10:00:00

## 2013-07-27 MED ORDER — 0.9 % SODIUM CHLORIDE (POUR BTL) OPTIME
TOPICAL | Status: DC | PRN
  Administered 2013-07-27: 1000 mL

## 2013-07-27 MED ORDER — LOSARTAN POTASSIUM-HCTZ 100-25 MG PO TABS: 1.0000 | ORAL_TABLET | Freq: Every morning | ORAL | Status: DC

## 2013-07-27 MED ORDER — ACETAMINOPHEN 325 MG PO TABS: 650.0000 mg | ORAL_TABLET | ORAL | Status: DC | PRN

## 2013-07-27 MED ORDER — LABETALOL HCL 5 MG/ML IV SOLN
INTRAVENOUS | Status: DC | PRN
  Administered 2013-07-27: 5 mg via INTRAVENOUS

## 2013-07-27 MED ORDER — VECURONIUM BROMIDE 10 MG IV SOLR
INTRAVENOUS | Status: DC | PRN
  Administered 2013-07-27 (×2): 4 mg via INTRAVENOUS

## 2013-07-27 MED ORDER — HYDROMORPHONE HCL PF 1 MG/ML IJ SOLN: 0.2500 mg | INTRAMUSCULAR | Status: DC | PRN

## 2013-07-27 MED ORDER — DOCUSATE SODIUM 100 MG PO CAPS
100.0000 mg | ORAL_CAPSULE | Freq: Two times a day (BID) | ORAL | Status: DC
  Administered 2013-07-27 – 2013-07-31 (×8): 100 mg via ORAL
  Filled 2013-07-27 (×8): qty 1

## 2013-07-27 MED ORDER — CALCIUM CITRATE-VITAMIN D 315-200 MG-UNIT PO TABS: 1.0000 | ORAL_TABLET | Freq: Every day | ORAL | Status: DC

## 2013-07-27 MED ORDER — ONDANSETRON HCL 4 MG/2ML IJ SOLN: 4.0000 mg | INTRAMUSCULAR | Status: DC | PRN

## 2013-07-27 MED ORDER — LIDOCAINE HCL (CARDIAC) 20 MG/ML IV SOLN
INTRAVENOUS | Status: DC | PRN
  Administered 2013-07-27: 100 mg via INTRAVENOUS

## 2013-07-27 MED ORDER — BUPIVACAINE HCL (PF) 0.25 % IJ SOLN
INTRAMUSCULAR | Status: DC | PRN
  Administered 2013-07-27: 20 mL

## 2013-07-27 MED ORDER — TAMSULOSIN HCL 0.4 MG PO CAPS
0.4000 mg | ORAL_CAPSULE | Freq: Every day | ORAL | Status: DC
  Administered 2013-07-27 – 2013-07-31 (×5): 0.4 mg via ORAL
  Filled 2013-07-27 (×5): qty 1

## 2013-07-27 MED ORDER — PHENOL 1.4 % MT LIQD: 1.0000 | OROMUCOSAL | Status: DC | PRN

## 2013-07-27 MED ORDER — CALCIUM CARBONATE-VITAMIN D 500-200 MG-UNIT PO TABS
1.0000 | ORAL_TABLET | Freq: Every day | ORAL | Status: DC
  Administered 2013-07-28 – 2013-07-31 (×4): 1 via ORAL
  Filled 2013-07-27 (×5): qty 1

## 2013-07-27 MED ORDER — GLYCOPYRROLATE 1 MG PO TABS
2.0000 mg | ORAL_TABLET | Freq: Two times a day (BID) | ORAL | Status: DC
  Administered 2013-07-27 – 2013-07-31 (×8): 2 mg via ORAL
  Filled 2013-07-27 (×10): qty 2

## 2013-07-27 MED ORDER — THROMBIN 20000 UNITS EX SOLR
CUTANEOUS | Status: DC | PRN
  Administered 2013-07-27: 10:00:00 via TOPICAL

## 2013-07-27 MED ORDER — FENTANYL CITRATE 0.05 MG/ML IJ SOLN
INTRAMUSCULAR | Status: DC | PRN
  Administered 2013-07-27: 50 ug via INTRAVENOUS
  Administered 2013-07-27: 150 ug via INTRAVENOUS
  Administered 2013-07-27 (×2): 100 ug via INTRAVENOUS
  Administered 2013-07-27 (×2): 50 ug via INTRAVENOUS

## 2013-07-27 MED ORDER — CYCLOBENZAPRINE HCL 10 MG PO TABS
10.0000 mg | ORAL_TABLET | Freq: Three times a day (TID) | ORAL | Status: DC | PRN
  Administered 2013-07-27 – 2013-07-30 (×3): 10 mg via ORAL
  Filled 2013-07-27 (×3): qty 1

## 2013-07-27 MED ORDER — VANCOMYCIN HCL IN DEXTROSE 1-5 GM/200ML-% IV SOLN
INTRAVENOUS | Status: AC
  Administered 2013-07-27: 1000 mg via INTRAVENOUS
  Filled 2013-07-27: qty 200

## 2013-07-27 MED ORDER — OXYCODONE HCL 5 MG/5ML PO SOLN: 5.0000 mg | Freq: Once | ORAL | Status: DC | PRN

## 2013-07-27 SURGICAL SUPPLY — 75 items
ADH SKN CLS APL DERMABOND .7 (GAUZE/BANDAGES/DRESSINGS) ×1
APL SKNCLS STERI-STRIP NONHPOA (GAUZE/BANDAGES/DRESSINGS) ×1
BAG DECANTER FOR FLEXI CONT (MISCELLANEOUS) ×2 IMPLANT
BENZOIN TINCTURE PRP APPL 2/3 (GAUZE/BANDAGES/DRESSINGS) ×2 IMPLANT
BLADE SURG 11 STRL SS (BLADE) ×2 IMPLANT
BLADE SURG ROTATE 9660 (MISCELLANEOUS) IMPLANT
BRUSH SCRUB EZ PLAIN DRY (MISCELLANEOUS) ×2 IMPLANT
BUR MATCHSTICK NEURO 3.0 LAGG (BURR) ×2 IMPLANT
BUR PRECISION FLUTE 6.0 (BURR) ×2 IMPLANT
CANISTER SUCT 3000ML (MISCELLANEOUS) ×2 IMPLANT
CONT SPEC 4OZ CLIKSEAL STRL BL (MISCELLANEOUS) ×4 IMPLANT
COVER BACK TABLE 24X17X13 BIG (DRAPES) IMPLANT
COVER TABLE BACK 60X90 (DRAPES) ×2 IMPLANT
DECANTER SPIKE VIAL GLASS SM (MISCELLANEOUS) ×2 IMPLANT
DERMABOND ADVANCED (GAUZE/BANDAGES/DRESSINGS) ×1
DERMABOND ADVANCED .7 DNX12 (GAUZE/BANDAGES/DRESSINGS) ×1 IMPLANT
DRAPE C-ARM 42X72 X-RAY (DRAPES) ×4 IMPLANT
DRAPE LAPAROTOMY 100X72X124 (DRAPES) ×2 IMPLANT
DRAPE POUCH INSTRU U-SHP 10X18 (DRAPES) ×2 IMPLANT
DRAPE PROXIMA HALF (DRAPES) IMPLANT
DRAPE SURG 17X23 STRL (DRAPES) ×2 IMPLANT
DRSG OPSITE 4X5.5 SM (GAUZE/BANDAGES/DRESSINGS) ×3 IMPLANT
DRSG OPSITE POSTOP 4X8 (GAUZE/BANDAGES/DRESSINGS) ×1 IMPLANT
DURAPREP 26ML APPLICATOR (WOUND CARE) ×2 IMPLANT
ELECT BLADE 4.0 EZ CLEAN MEGAD (MISCELLANEOUS) ×2
ELECT REM PT RETURN 9FT ADLT (ELECTROSURGICAL) ×2
ELECTRODE BLDE 4.0 EZ CLN MEGD (MISCELLANEOUS) IMPLANT
ELECTRODE REM PT RTRN 9FT ADLT (ELECTROSURGICAL) ×1 IMPLANT
EVACUATOR 3/16  PVC DRAIN (DRAIN) ×1
EVACUATOR 3/16 PVC DRAIN (DRAIN) ×1 IMPLANT
GAUZE SPONGE 4X4 16PLY XRAY LF (GAUZE/BANDAGES/DRESSINGS) ×1 IMPLANT
GLOVE BIO SURGEON STRL SZ8 (GLOVE) ×2 IMPLANT
GLOVE ECLIPSE 7.5 STRL STRAW (GLOVE) IMPLANT
GLOVE EXAM NITRILE LRG STRL (GLOVE) IMPLANT
GLOVE EXAM NITRILE MD LF STRL (GLOVE) IMPLANT
GLOVE EXAM NITRILE XL STR (GLOVE) IMPLANT
GLOVE EXAM NITRILE XS STR PU (GLOVE) IMPLANT
GLOVE INDICATOR 7.5 STRL GRN (GLOVE) ×1 IMPLANT
GLOVE INDICATOR 8.5 STRL (GLOVE) ×5 IMPLANT
GLOVE OPTIFIT SS 8.0 STRL (GLOVE) ×2 IMPLANT
GLOVE SURG SS PI 6.5 STRL IVOR (GLOVE) ×2 IMPLANT
GLOVE SURG SS PI 7.0 STRL IVOR (GLOVE) ×1 IMPLANT
GOWN BRE IMP SLV AUR LG STRL (GOWN DISPOSABLE) ×1 IMPLANT
GOWN BRE IMP SLV AUR XL STRL (GOWN DISPOSABLE) ×4 IMPLANT
GOWN STRL REIN 2XL LVL4 (GOWN DISPOSABLE) IMPLANT
KIT BASIN OR (CUSTOM PROCEDURE TRAY) ×2 IMPLANT
KIT INFUSE X SMALL 1.4CC (Orthopedic Implant) ×1 IMPLANT
KIT ROOM TURNOVER OR (KITS) ×2 IMPLANT
MASTERGRAFT STRIP 10CM ×2 IMPLANT
MATRIX MASTERGRAFT STRIP 10CM IMPLANT
MIX DBX 10CC 35% BONE (Bone Implant) ×1 IMPLANT
NDL HYPO 25X1 1.5 SAFETY (NEEDLE) ×1 IMPLANT
NEEDLE HYPO 25X1 1.5 SAFETY (NEEDLE) ×2 IMPLANT
NS IRRIG 1000ML POUR BTL (IV SOLUTION) ×2 IMPLANT
PACK LAMINECTOMY NEURO (CUSTOM PROCEDURE TRAY) ×2 IMPLANT
PAD ARMBOARD 7.5X6 YLW CONV (MISCELLANEOUS) ×6 IMPLANT
ROD CURVED 70MM (Rod) ×2 IMPLANT
ROD SMOOTH DEN 5.5X70 NS (Rod) IMPLANT
SCREW POLYAXIAL EVEREST 8.5X45 (Screw) ×2 IMPLANT
SCREW SET ATR (Screw) ×4 IMPLANT
SET SCREW (Screw) ×4 IMPLANT
SET SCREW VRST (Screw) IMPLANT
SPONGE GAUZE 4X4 12PLY (GAUZE/BANDAGES/DRESSINGS) ×2 IMPLANT
SPONGE LAP 4X18 X RAY DECT (DISPOSABLE) IMPLANT
SPONGE SURGIFOAM ABS GEL 100 (HEMOSTASIS) ×2 IMPLANT
STRIP CLOSURE SKIN 1/2X4 (GAUZE/BANDAGES/DRESSINGS) ×3 IMPLANT
SUT VIC AB 0 CT1 18XCR BRD8 (SUTURE) ×2 IMPLANT
SUT VIC AB 0 CT1 8-18 (SUTURE) ×4
SUT VIC AB 2-0 CT1 18 (SUTURE) ×2 IMPLANT
SUT VICRYL 4-0 PS2 18IN ABS (SUTURE) ×2 IMPLANT
SYR 20ML ECCENTRIC (SYRINGE) ×2 IMPLANT
TOWEL OR 17X24 6PK STRL BLUE (TOWEL DISPOSABLE) ×2 IMPLANT
TOWEL OR 17X26 10 PK STRL BLUE (TOWEL DISPOSABLE) ×2 IMPLANT
TRAY FOLEY CATH 14FRSI W/METER (CATHETERS) ×2 IMPLANT
WATER STERILE IRR 1000ML POUR (IV SOLUTION) ×2 IMPLANT

## 2013-07-27 NOTE — Op Note (Signed)
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 pedicle screw system and redo posterior lateral arthrodesis L2-L4 using BMP DBX and master graft. Then utilizing new top tightening connectors and quarter-inch rod of the K2M and set from L2-L4.   Surgeon: Jillyn Hidden cram  Assistant:Neelsesh Conchita Paris  Anesthesia: Gen.  EBL: Minimal  History of present illness: Patient is a 67 year old female undergone previous L2-L4 fusion 2 years ago developed recurrent and progressive worsening back and bilateral leg pain workup revealed a pseudoarthrosis at L3-4 anterior fake service treatment imaging findings and progression syndrome I recommended reexploration of fusion fusion removal of hardware with revision of posterior lateral fusion probably L3-4 with replacement of L4 pedicle screws. I extensively reviewed the risks and benefits of the operation the patient as well as perioperative course expectations about alternatives of surgery she understood and agreed to proceed forward.  Operative procedure: Patient brought into the or was induced under general anesthesia positioned prone the Wilson frame her back was prepped and draped in routine sterile fashion her old incision was opened up and the scar tissues dissected free exposing the hardware from L2-L4 there is an extensive amount of bony overgrowth overlying the head of the L4 pedicle screw this is all removed removed and was felt to be independent islands of down the nuts were then removed the rods removed the screws at L4 were loose the fusion L34 appear to be incomplete however the fusion L2-3 appear to be solid. So after the L4 screws were removed the TPS at L3 and L4 were dissected free a stress with her as well as the posterior lateral bone from L2-3 after adequate dissection carried out  to expose posterior lateral bone and TPS from L2-L4 the wound scope was irrigated because hemostasis was maintained aggressive decortication was care MTPs or lateral gutters and then a BMP sponge and DBX and master graft was laid posterior laterally the L4 screws were placed under fluoroscopic guidance with replacement of 6 x 45 screws with 04/28/1944 screws all screws appeared to have excellent purchase to the rods were contoured and placed top tightening nuts were tightened down and torqued down. A large Hemovac drain was placed and the wounds closed in layers with after Vicryl and a running 4 subcuticular benzoin and Steri-Strips were applied patient recovered in stable condition. At the end of case on it counts sponge counts were correct.

## 2013-07-27 NOTE — Progress Notes (Signed)
UR COMPLETED  

## 2013-07-27 NOTE — Anesthesia Preprocedure Evaluation (Signed)
Anesthesia Evaluation  Patient identified by MRN, date of birth, ID band Patient awake    Reviewed: Allergy & Precautions, H&P , NPO status , Patient's Chart, lab work & pertinent test results, reviewed documented beta blocker date and time   Airway Mallampati: II TM Distance: >3 FB Neck ROM: full    Dental   Pulmonary shortness of breath and with exertion, asthma , sleep apnea ,  breath sounds clear to auscultation        Cardiovascular hypertension, On Medications Rhythm:regular     Neuro/Psych  Headaches, PSYCHIATRIC DISORDERS  Neuromuscular disease    GI/Hepatic Neg liver ROS, GERD-  Medicated and Controlled,  Endo/Other  negative endocrine ROS  Renal/GU negative Renal ROS  negative genitourinary   Musculoskeletal   Abdominal   Peds  Hematology negative hematology ROS (+)   Anesthesia Other Findings See surgeon's H&P   Reproductive/Obstetrics negative OB ROS                           Anesthesia Physical Anesthesia Plan  ASA: III  Anesthesia Plan: General   Post-op Pain Management:    Induction: Intravenous  Airway Management Planned: Oral ETT  Additional Equipment:   Intra-op Plan:   Post-operative Plan: Extubation in OR  Informed Consent: I have reviewed the patients History and Physical, chart, labs and discussed the procedure including the risks, benefits and alternatives for the proposed anesthesia with the patient or authorized representative who has indicated his/her understanding and acceptance.   Dental Advisory Given  Plan Discussed with: CRNA and Surgeon  Anesthesia Plan Comments:         Anesthesia Quick Evaluation

## 2013-07-27 NOTE — Progress Notes (Signed)
ANTIBIOTIC CONSULT NOTE - INITIAL  Pharmacy Consult for vancomycin Indication: post-op prophylaxis with spinal drain in place  Allergies  Allergen Reactions  . Penicillins Rash  . Latex Other (See Comments)    Sores, blisters    Vital Signs: Temp: 98.6 F (37 C) (11/03 1548) Temp src: Oral (11/03 1548) BP: 141/79 mmHg (11/03 1548) Pulse Rate: 92 (11/03 1548) Intake/Output from previous day:   Intake/Output from this shift: Total I/O In: 2150 [I.V.:2150] Out: 800 [Urine:450; Blood:350]  Labs: No results found for this basename: WBC, HGB, PLT, LABCREA, CREATININE,  in the last 72 hours The CrCl is unknown because both a height and weight (above a minimum accepted value) are required for this calculation. No results found for this basename: VANCOTROUGH, Leodis Binet, VANCORANDOM, GENTTROUGH, GENTPEAK, GENTRANDOM, TOBRATROUGH, TOBRAPEAK, TOBRARND, AMIKACINPEAK, AMIKACINTROU, AMIKACIN,  in the last 72 hours   Microbiology: Recent Results (from the past 720 hour(s))  SURGICAL PCR SCREEN     Status: None   Collection Time    07/22/13 10:28 AM      Result Value Range Status   MRSA, PCR NEGATIVE  NEGATIVE Final   Staphylococcus aureus NEGATIVE  NEGATIVE Final   Comment:            The Xpert SA Assay (FDA     approved for NASAL specimens     in patients over 20 years of age),     is one component of     a comprehensive surveillance     program.  Test performance has     been validated by [employer] for patients greater     than or equal to 30 year old.     It is not intended     to diagnose infection nor to     guide or monitor treatment.    Medical History: Past Medical History  Diagnosis Date  . Anxiety   . Dysphonia     intermittent "voice changes"  . Chronic back pain     steroid injection last 01-20-13 next 02-19-13  . Arthritis     spinal fusion-lumbar; reains with chronic pain, steroid injections at present  . History of blood clots     tx. '89-Heparin and  Coumadin-affected left arm  . Hypertension     takes Hyzaar and Plendil daily  . History of blood clots     right arm-about 57yrs ago  . Sleep apnea     Dx.unable to tolerate cpap-no machine now  . Seasonal allergies     takes Benadryl daily prn  . Asthma     pt was told she has "sleeping asthma"  . Headache(784.0)     occasionally  . Fibromyalgia   . Weakness     both hands and feet  . Chronic back pain   . Constipation   . Urinary frequency     takes Rapaflo daily prn  . History of blood transfusion   . Glaucoma   . Depression     takes Paxil daily  . Insomnia     occasionally and no meds required  . History of MRSA infection 2011  . GERD (gastroesophageal reflux disease)     prone to throat spasms related to reflux; takes Nexium daily and Robinul bid   Assessment: 67 year old female s/p spinal surgery with drain in place. Orders to continue IV vancomycin post-op until discontinued by MD. Renal function is normal.  Goal of Therapy:  Vancomycin trough level  10-15 mcg/ml  Plan:  Measure antibiotic drug levels at steady state Follow up culture results Vancomycin 1g q12 hours  Sheppard Coil PharmD., BCPS Clinical Pharmacist Pager [phone] 07/27/2013 4:16 PM

## 2013-07-27 NOTE — Transfer of Care (Signed)
Immediate Anesthesia Transfer of Care Note  Patient: Amy [name]  Procedure(s) Performed: Procedure(s): EXPLORATION OF POSTERIOR LUMBAR  FUSION  REDO POSTERIOR LUMBAR INTERBODY FUSION LUMBAR THREE-FOUR WITH REMOVAL HARDWARE LUMBAR TWO TO LUMBAR FOUR (N/A)  Patient Location: PACU  Anesthesia Type:General  Level of Consciousness: sedated, patient cooperative and responds to stimulation  Airway & Oxygen Therapy: Patient Spontanous Breathing and Patient connected to nasal cannula oxygen  Post-op Assessment: Report given to PACU RN, Post -op Vital signs reviewed and stable and Patient moving all extremities  Post vital signs: Reviewed and stable  Complications: No apparent anesthesia complications

## 2013-07-27 NOTE — H&P (Addendum)
Amy [name] is an 67 y.o. female.   Chief Complaint: Back and bilateral leg HPI: Patient is very pleasant 67 year old female has undergone previous lumbar fusion L4-S1 with subsequent from L2-L4. She's had the development of last year and half of progressive worsening back and bilateral leg pain rating L3 and L4 nerve root better. 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 An extensive review the risks and benefits of impression the patient as well as perioperative course expected stuck about her surgery she in stance and agrees to proceed forward.  Past Medical History  Diagnosis Date  . Anxiety   . Dysphonia     intermittent "voice changes"  . Chronic back pain     steroid injection last 01-20-13 next 02-19-13  . Arthritis     spinal fusion-lumbar; reains with chronic pain, steroid injections at present  . History of blood clots     tx. '89-Heparin and Coumadin-affected left arm  . Hypertension     takes Hyzaar and Plendil daily  . History of blood clots     right arm-about 20yrs ago  . Sleep apnea     Dx.unable to tolerate cpap-no machine now  . Seasonal allergies     takes Benadryl daily prn  . Asthma     pt was told she has "sleeping asthma"  . Headache(784.0)     occasionally  . Fibromyalgia   . Weakness     both hands and feet  . Chronic back pain   . Constipation   . Urinary frequency     takes Rapaflo daily prn  . History of blood transfusion   . Glaucoma   . Depression     takes Paxil daily  . Insomnia     occasionally and no meds required  . History of MRSA infection 2011  . GERD (gastroesophageal reflux disease)     prone to throat spasms related to reflux; takes Nexium daily and Robinul  bid    Past Surgical History  Procedure Laterality Date  . Abdominal hysterectomy    . Spinal fusion      Titanium hardware  . Nose surgery    . Bone graft hip iliac crest Right     for lumbar spinal surgery/fusion  . Back surgery  02-09-13    2012 - back surgery  . Mass excision Left 02/20/2013    Procedure: EXCISION LEFT BUTTOCK  MASS;  Surgeon: Ardeth Sportsman, MD;  Location: [facility];  Service: General;  Laterality: Left;  . Lipoma excision  01/2013  . Esophagogastroduodenoscopy N/A 07/15/2013    Procedure: ESOPHAGOGASTRODUODENOSCOPY (EGD);  Surgeon: Vertell Novak., MD;  Location: [facility];  Service: Endoscopy;  Laterality: N/A;  need xray  . Abdominal adhesions removed    . Colonoscopy      Family History  Problem Relation Age of Onset  . Hypertension Mother   . Cancer Mother     cervix  . Kidney disease Mother   . Hypertension Sister   . Cancer Sister     polyps  . Kidney disease Brother   . Cancer Daughter     leukemia  . Hypertension Brother    Social History:  reports that she has never smoked. She  has never used smokeless tobacco. She reports that she does not drink alcohol or use illicit drugs.  Allergies:  Allergies  Allergen Reactions  . Penicillins Rash  . Latex Other (See Comments)    Sores, blisters    Medications Prior to Admission  Medication Sig Dispense Refill  . acetaminophen (TYLENOL) 500 MG tablet Take 1,000 mg by mouth every 8 (eight) hours as needed for pain.      Marland Kitchen azelaic acid (AZELEX) 20 % cream Apply 1 application topically 2 (two) times daily. After skin is thoroughly washed and patted dry, gently but thoroughly massage a thin film of azelaic acid cream into the affected area twice daily, in the morning and evening.      . B Complex Vitamins (VITAMIN B COMPLEX PO) Take 1 tablet by mouth daily.      . calcium citrate-vitamin D (CITRACAL+D) 315-200 MG-UNIT per tablet Take 1 tablet by mouth daily.      . celecoxib (CELEBREX) 200 MG  capsule Take 200 mg by mouth at bedtime.      . cholecalciferol (VITAMIN D) 1000 UNITS tablet Take 1,000 Units by mouth daily.      . diphenhydrAMINE (BENADRYL) 25 mg capsule Take 25 mg by mouth 2 (two) times daily as needed for allergies.      Marland Kitchen esomeprazole (NEXIUM) 40 MG capsule Take 40 mg by mouth 2 (two) times daily.       . felodipine (PLENDIL) 10 MG 24 hr tablet Take 10 mg by mouth every morning.       Marland Kitchen glycopyrrolate (ROBINUL) 2 MG tablet Take 2 mg by mouth 2 (two) times daily.       Marland Kitchen losartan-hydrochlorothiazide (HYZAAR) 100-25 MG per tablet Take 1 tablet by mouth every morning.       . Multiple Vitamin (MULTIVITAMIN WITH MINERALS) TABS Take 1 tablet by mouth daily.      Marland Kitchen PARoxetine (PAXIL) 30 MG tablet Take 30 mg by mouth at bedtime.       Marland Kitchen POTASSIUM PO Take 1 tablet by mouth daily.      . silodosin (RAPAFLO) 8 MG CAPS capsule Take 8 mg by mouth daily as needed (for bladder).       . traMADol (ULTRAM-ER) 100 MG 24 hr tablet Take 100 mg by mouth daily as needed for pain.       . vitamin E (VITAMIN E) 400 UNIT capsule Take 400 Units by mouth daily.        No results found for this or any previous visit (from the past 48 hour(s)). No results found.  Review of Systems  Constitutional: Negative.   HENT: Negative.   Eyes: Negative.   Respiratory: Negative.   Cardiovascular: Negative.   Gastrointestinal: Negative.   Genitourinary: Negative.   Musculoskeletal: Positive for back pain, joint pain and myalgias.  Skin: Negative.   Neurological: Positive for tingling.  Endo/Heme/Allergies: Negative.   Psychiatric/Behavioral: Negative.     Blood pressure 180/87, pulse 99, temperature 98.4 F (36.9 C), temperature source Oral, resp. rate 18, SpO2 98.00%. Physical Exam  Constitutional: She is oriented to person, place, and time. She appears well-developed and well-nourished.  Eyes: Pupils are equal, round, and reactive to light.  Neck: Normal range of motion.  Cardiovascular:  Normal rate.   Respiratory: Effort normal.  GI: Soft.  Neurological: She is alert and oriented to person, place, and time. She has normal strength. GCS eye subscore is 4. GCS verbal subscore is 5. GCS motor subscore  is 6.  Reflex Scores:      Patellar reflexes are 0 on the right side and 0 on the left side.      Achilles reflexes are 0 on the right side and 0 on the left side. Strength is 5 out of 5 in her iliopsoas, quads, Jesus contrast her, and tibialis, EHL.  Skin: Skin is warm and dry.     Assessment/Plan 67 year old now presents for revision of lumbar fusion L3-4.  CRAM,GARY P 07/27/2013, 9:51 AM  Addendum: Morbid obesity

## 2013-07-27 NOTE — Anesthesia Postprocedure Evaluation (Signed)
Anesthesia Post Note  Patient: Amy [name]  Procedure(s) Performed: Procedure(s) (LRB): EXPLORATION OF POSTERIOR LUMBAR  FUSION  REDO POSTERIOR LUMBAR INTERBODY FUSION LUMBAR THREE-FOUR WITH REMOVAL HARDWARE LUMBAR TWO TO LUMBAR FOUR (N/A)  Anesthesia type: General  Patient location: PACU  Post pain: Pain level controlled  Post assessment: Patient's Cardiovascular Status Stable  Last Vitals:  Filed Vitals:   07/27/13 1355  BP: 130/78  Pulse: 82  Temp:   Resp: 15    Post vital signs: Reviewed and stable  Level of consciousness: alert  Complications: No apparent anesthesia complications

## 2013-07-28 NOTE — Progress Notes (Signed)
Subjective: Patient reports She's feeling great back pain is well controlled no leg pain no new numbness or tingling  Objective: Vital signs in last 24 hours: Temp:  [97.2 F (36.2 C)-98.6 F (37 C)] 98.3 F (36.8 C) (11/04 0700) Pulse Rate:  [80-107] 107 (11/04 0700) Resp:  [14-18] 16 (11/04 0700) BP: (129-180)/(67-87) 129/73 mmHg (11/04 0700) SpO2:  [91 %-98 %] 91 % (11/04 0700) Weight:  [120 kg (264 lb 8.8 oz)] 120 kg (264 lb 8.8 oz) (11/03 2246)  Intake/Output from previous day: 11/03 0701 - 11/04 0700 In: 2270 [P.O.:120; I.V.:2150] Out: 2130 [Urine:1450; Drains:330; Blood:350] Intake/Output this shift:    Strength out of 5 wound clean and dry  Lab Results: No results found for this basename: WBC, HGB, HCT, PLT,  in the last 72 hours BMET No results found for this basename: NA, K, CL, CO2, GLUCOSE, BUN, CREATININE, CALCIUM,  in the last 72 hours  Studies/Results: Dg Lumbar Spine 2-3 Views  07/27/2013   CLINICAL DATA:  Pseudoarthrosis at L3-4.  EXAM: LUMBAR SPINE - 2-3 VIEW  COMPARISON:  CT scan dated 07/17/2013  FINDINGS: AP and lateral C-arm images demonstrate new pedicle screws at L4. Posterior rods have been removed at the time of these images.  IMPRESSION: New pedicle screws at L4.   Electronically Signed   By: Geanie Cooley M.D.   On: 07/27/2013 15:40    Assessment/Plan: Progressive mobilization today with physical therapy  LOS: 1 day     CRAM,GARY P 07/28/2013, 7:18 AM

## 2013-07-28 NOTE — Evaluation (Signed)
Occupational Therapy Evaluation Patient Details Name: Amy [name] MRN: [id number] DOB: [birth year] Today's Date: 07/28/2013 Time: 4098-1191 OT Time Calculation (min): 28 min  OT Assessment / Plan / Recommendation History of present illness Pt is 67 yo female s/p EXPLORATION OF POSTERIOR LUMBAR  FUSION  REDO POSTERIOR LUMBAR INTERBODY FUSION LUMBAR THREE-FOUR WITH REMOVAL HARDWARE LUMBAR TWO TO LUMBAR FOUR    Clinical Impression   Pt lethargic during session. Pt presents with below problem list. Pt independent with ADLs, PTA. Pt will benefit from acute OT to increase independence prior to d/c.  OT Assessment  Patient needs continued OT Services    Follow Up Recommendations  Home health OT;Supervision/Assistance - 24 hour    Barriers to Discharge Decreased caregiver support    Equipment Recommendations  3 in 1 bedside comode;Other (comment) (larger size 3 in 1; shower equipment tbd)    Recommendations for Other Services    Frequency  Min 2X/week    Precautions / Restrictions Precautions Precautions: Back;Fall Precaution Booklet Issued: Yes (comment) Precaution Comments: Pt able to recall 2/3 back precautions Required Braces or Orthoses: Spinal Brace Spinal Brace: Lumbar corset;Applied in sitting position   Pertinent Vitals/Pain Pain 3-4/10 in back. Repositioned.     ADL  Upper Body Bathing: Set up;Supervision/safety Where Assessed - Upper Body Bathing: Unsupported sitting Lower Body Bathing: Maximal assistance Where Assessed - Lower Body Bathing: Supported sit to stand Upper Body Dressing: Set up;Supervision/safety Where Assessed - Upper Body Dressing: Unsupported sitting Lower Body Dressing: Moderate assistance Where Assessed - Lower Body Dressing: Supported sit to stand Toilet Transfer: Hydrographic surveyor Method: Sit to Barista: Raised toilet seat with arms (or 3-in-1 over toilet);Comfort height toilet;Grab bars Toileting -  Clothing Manipulation and Hygiene: Moderate assistance Where Assessed - Toileting Clothing Manipulation and Hygiene: Sit to stand from 3-in-1 or toilet;Sit on 3-in-1 or toilet Tub/Shower Transfer Method: Not assessed Equipment Used: Gait belt;Back brace;Rolling walker;Sock aid;Reacher;Long-handled sponge;Long-handled shoe horn Transfers/Ambulation Related to ADLs: Min guard  ADL Comments: Educated on AE for LB ADLs. Pt practiced donning/doffing sock with reacher and sockaid. Pt unable to fully reach to clean thoroughly for simulated hygiene in back. Educated on toilet aid. Pt able to don/doff back brace at supervision/setup level.  Educated on use of two cups for teeth care and also having grooming supplies on right side of sink to avoid breaking precautions.     OT Diagnosis: Acute pain  OT Problem List: Decreased strength;Decreased range of motion;Decreased activity tolerance;Impaired balance (sitting and/or standing);Decreased knowledge of use of DME or AE;Decreased knowledge of precautions;Pain OT Treatment Interventions: Self-care/ADL training;DME and/or AE instruction;Therapeutic activities;Patient/family education;Balance training   OT Goals(Current goals can be found in the care plan section) Acute Rehab OT Goals Patient Stated Goal: not stated OT Goal Formulation: With patient Time For Goal Achievement: 08/04/13 Potential to Achieve Goals: Good ADL Goals Pt Will Perform Grooming: with modified independence;standing Pt Will Perform Lower Body Bathing: with adaptive equipment;sit to/from stand;with modified independence Pt Will Perform Lower Body Dressing: with adaptive equipment;sit to/from stand;with modified independence Pt Will Transfer to Toilet: with modified independence;ambulating (3 in 1 over commode) Pt Will Perform Toileting - Clothing Manipulation and hygiene: with modified independence;with adaptive equipment;sit to/from stand;sitting/lateral leans Pt Will Perform  Tub/Shower Transfer: with supervision;ambulating;rolling walker;Tub transfer (tub equipment tbd) Additional ADL Goal #1: Pt will be able to independently state 3/3 back precautions.  Additional ADL Goal #2: Pt will be Mod I for bed mobility as precursor for ADLs.  Visit Information  Last OT Received On: 07/28/13 Assistance Needed: +1 History of Present Illness: Pt is 67 yo female s/p EXPLORATION OF POSTERIOR LUMBAR  FUSION  REDO POSTERIOR LUMBAR INTERBODY FUSION LUMBAR THREE-FOUR WITH REMOVAL HARDWARE LUMBAR TWO TO LUMBAR FOUR        Prior Functioning     Home Living Family/patient expects to be discharged to:: Private residence Living Arrangements: Spouse/significant other Available Help at Discharge: Family Type of Home: House Home Access: Stairs to enter Secretary/administrator of Steps: 1 Entrance Stairs-Rails: None Home Layout: Two level;Bed/bath upstairs Alternate Level Stairs-Number of Steps: 12 Alternate Level Stairs-Rails: Left Home Equipment: Cane - single point Prior Function Level of Independence: Independent with assistive device(s) Comments: used cane Communication Communication: No difficulties Dominant Hand: Right         Vision/Perception     Cognition  Cognition Arousal/Alertness: Lethargic Behavior During Therapy: WFL for tasks assessed/performed Overall Cognitive Status: Within Functional Limits for tasks assessed    Extremity/Trunk Assessment Upper Extremity Assessment Upper Extremity Assessment: Overall WFL for tasks assessed Lower Extremity Assessment Lower Extremity Assessment: Defer to PT evaluation     Mobility Bed Mobility Bed Mobility: Sit to Sidelying Right Sit to Sidelying Right: 4: Min assist;HOB flat Details for Bed Mobility Assistance: Assisted with legs. Cues for technique. Transfers Transfers: Sit to Stand;Stand to Sit Sit to Stand: 4: Min guard;With upper extremity assist;From chair/3-in-1;From toilet Stand to Sit: 4: Min  guard;With upper extremity assist;To bed;To toilet;To chair/3-in-1 Details for Transfer Assistance: Min guard for safety. Cues for technique.     Exercise     Balance     End of Session OT - End of Session Equipment Utilized During Treatment: Gait belt;Rolling walker;Back brace Activity Tolerance: Patient tolerated treatment well Patient left: in bed;with call bell/phone within reach;with bed alarm set  GO     Earlie Raveling OTR/L [phone] 07/28/2013, 1:04 PM

## 2013-07-28 NOTE — Clinical Documentation Improvement (Signed)
THIS DOCUMENT IS NOT A PERMANENT PART OF THE MEDICAL RECORD  Please update your documentation with the medical record to reflect your response to this query. If you need help knowing how to do this please call [phone].  07/28/13  Dear Dr.Cram, In an effort to better capture your patient's severity of illness, reflect appropriate length of stay and utilization of resources, a review of the patient medical record has revealed the following indicators.   Based on your clinical judgment, please clarify and document in a progress note and/or discharge summary the clinical condition associated with the following supporting information: In responding to this query please exercise your independent judgment.  The fact that a query is asked, does not imply that any particular answer is desired or expected.  Hello Dr. Wynetta Emery!  According to the documented Height and Weight in [facility]/EPIC, the patients BMI is 44.1. If your clinical findings/judgment agrees with this,if possible could you please help clarify the suspected diagnosis in the progress note and discharge summary. THANK YOU!    [facility]: A diagnosis of UNDERWEIGHT or MORBID OBESITY should have the BMI documented along with it.  Possible Clinical Conditions?  - Morbid Obesity  - Obesity   - Other condition (please document in the progress notes and/or discharge summary)  - Cannot Clinically determine at this time   Supporting Information:  Estimated body mass index is 44.02 kg/(m^2) as calculated from the following:   Height as of this encounter: 5\' 5"  (1.651 m).   Weight as of this encounter: 264 lb 8.8 oz (120 kg).     No additional documentation in chart upon review. SM   Thank You,  Saul Fordyce  Clinical Documentation Specialist: [phone] Health Information Management [place]

## 2013-07-28 NOTE — Evaluation (Signed)
Physical Therapy Evaluation Patient Details Name: Amy [name] MRN: [id number] DOB: April 05, 1946 Today's Date: 07/28/2013 Time: 4098-1191 PT Time Calculation (min): 26 min  PT Assessment / Plan / Recommendation History of Present Illness  Pt is 67 yo female s/p EXPLORATION OF POSTERIOR LUMBAR  FUSION  REDO POSTERIOR LUMBAR INTERBODY FUSION LUMBAR THREE-FOUR WITH REMOVAL HARDWARE LUMBAR TWO TO LUMBAR FOUR   Clinical Impression  Patient demonstrates deficits in functional mobility as indicated below. Patient will benefit from continued skilled PT to address deficits and maximize function. Will continue to see as indicated and progress activity as tolerated.    PT Assessment  Patient needs continued PT services    Follow Up Recommendations  Home health PT;Supervision/Assistance - 24 hour    Does the patient have the potential to tolerate intense rehabilitation      Barriers to Discharge Inaccessible home environment 12 steps to bed and bathroom    Equipment Recommendations  Rolling walker with 5" wheels;3in1 (PT)    Recommendations for Other Services     Frequency Min 5X/week    Precautions / Restrictions Precautions Precautions: Back;Fall Precaution Booklet Issued: Yes (comment) Precaution Comments: Educated and teach back for back precautions Required Braces or Orthoses: Spinal Brace Spinal Brace: Lumbar corset Restrictions Weight Bearing Restrictions: No   Pertinent Vitals/Pain Pain 2/10, BP 115/72 HR 81      Mobility  Bed Mobility Bed Mobility: Not assessed Transfers Transfers: Sit to Stand;Stand to Sit Sit to Stand: 4: Min assist Stand to Sit: 4: Min assist Details for Transfer Assistance: VCs for hand placement and technique, assist for stability, increased time to perform, VCs and tactile cues for arousal Ambulation/Gait Ambulation/Gait Assistance: 4: Min assist Ambulation Distance (Feet): 8 Feet Assistive device: Rolling walker Ambulation/Gait  Assistance Details: Assist for stability, multimodal cues for arousal  Gait Pattern: Step-to pattern Gait velocity: decreased General Gait Details: + dizziness with standing    Exercises     PT Diagnosis: Difficulty walking;Abnormality of gait;Generalized weakness;Acute pain  PT Problem List: Decreased strength;Decreased range of motion;Decreased activity tolerance;Decreased balance;Decreased mobility;Decreased coordination;Decreased knowledge of use of DME;Decreased safety awareness;Pain PT Treatment Interventions: DME instruction;Gait training;Stair training;Functional mobility training;Therapeutic activities;Therapeutic exercise;Balance training;Patient/family education     PT Goals(Current goals can be found in the care plan section) Acute Rehab PT Goals Patient Stated Goal: to go home PT Goal Formulation: With patient/family Time For Goal Achievement: 08/11/13 Potential to Achieve Goals: Good  Visit Information  Last PT Received On: 07/28/13 Assistance Needed: +1 History of Present Illness: Pt is 67 yo female s/p EXPLORATION OF POSTERIOR LUMBAR  FUSION  REDO POSTERIOR LUMBAR INTERBODY FUSION LUMBAR THREE-FOUR WITH REMOVAL HARDWARE LUMBAR TWO TO LUMBAR FOUR        Prior Functioning  Home Living Family/patient expects to be discharged to:: Private residence Living Arrangements: Spouse/significant other Available Help at Discharge: Family Type of Home: House Home Access: Stairs to enter Secretary/administrator of Steps: 1 Entrance Stairs-Rails: None Home Layout: Two level;Bed/bath upstairs Alternate Level Stairs-Number of Steps: 12 Alternate Level Stairs-Rails: Left Home Equipment: Cane - single point Additional Comments: tub shower no seat Prior Function Level of Independence: Independent with assistive device(s) Comments: used cane Communication Communication: No difficulties Dominant Hand: Right    Cognition  Cognition Arousal/Alertness: Lethargic Behavior During  Therapy: WFL for tasks assessed/performed Overall Cognitive Status: Within Functional Limits for tasks assessed    Extremity/Trunk Assessment Upper Extremity Assessment Upper Extremity Assessment: Defer to OT evaluation Lower Extremity Assessment Lower Extremity Assessment: Generalized weakness  End of Session PT - End of Session Equipment Utilized During Treatment: Gait belt Activity Tolerance: Patient limited by fatigue;Patient limited by lethargy;Patient limited by pain Patient left: in chair;with call bell/phone within reach Nurse Communication: Mobility status;Other (comment) (BP 115/72 HR 81)  GP     Fabio Asa 07/28/2013, 11:20 AM Charlotte Crumb, PT DPT  [phone]

## 2013-07-28 NOTE — Progress Notes (Signed)
Pt. Foley was DC at 0630 this morning.  Throughout the day patient was only able to void dribbles.  I bladder scanned patient and she had 848 mL.  MD was paged and indicated to In/Out cath patient empty, give her 6 hours to relax her bladder.  If patient was unable to void within the 6 hours to go ahead and place a Urethral Catheter.  Pt. In/Out cath emptied .

## 2013-07-29 NOTE — Progress Notes (Signed)
Occupational Therapy Treatment Patient Details Name: Amy [name] MRN: [id number] DOB: 09/24/1946 Today's Date: 07/29/2013 Time: 4098-1191 OT Time Calculation (min): 32 min  OT Assessment / Plan / Recommendation  History of present illness Pt is 67 yo female s/p EXPLORATION OF POSTERIOR LUMBAR  FUSION  REDO POSTERIOR LUMBAR INTERBODY FUSION LUMBAR THREE-FOUR WITH REMOVAL HARDWARE LUMBAR TWO TO LUMBAR FOUR    OT comments  Pt practiced with reacher for donning underwear, performed toileting tasks, and grooming at sink. Pt progressing towards goals. Plan to practice tub transfer next session.   Follow Up Recommendations  Home health OT;Supervision/Assistance - 24 hour    Barriers to Discharge       Equipment Recommendations  Other (comment);3 in 1 bedside comode (larger size 3 in 1; shower equipment tbd)    Recommendations for Other Services    Frequency Min 2X/week   Progress towards OT Goals Progress towards OT goals: Progressing toward goals  Plan Discharge plan remains appropriate    Precautions / Restrictions Precautions Precautions: Back;Fall Precaution Comments: Reviewed precautions with pt Required Braces or Orthoses: Spinal Brace Spinal Brace: Lumbar corset;Applied in sitting position Restrictions Weight Bearing Restrictions: No   Pertinent Vitals/Pain Pain 8/10. Increased activity. Repositioned.     ADL  Grooming: Teeth care;Wash/dry face;Set up;Supervision/safety Where Assessed - Grooming: Supported standing Upper Body Dressing: Minimal assistance (back brace) Where Assessed - Upper Body Dressing: Unsupported sitting Lower Body Dressing: Min guard (underwear) Where Assessed - Lower Body Dressing: Supported sit to stand Toilet Transfer: Hydrographic surveyor Method: Sit to Barista: Comfort height toilet;Grab bars Toileting - Architect and Hygiene: Moderate assistance Where Assessed - Toileting Clothing  Manipulation and Hygiene: Sit to stand from 3-in-1 or toilet Equipment Used: Gait belt;Back brace;Rolling walker;Reacher Transfers/Ambulation Related to ADLs: Min guard ADL Comments: Pt performed toileting and grooming at sink. Donned brace on EOB. Used two cups for teeth care and reviewed having items on right side of sink to avoid breaking precautions.  Pt donned underwear with reacher.     OT Diagnosis:    OT Problem List:   OT Treatment Interventions:     OT Goals(current goals can now be found in the care plan section) Acute Rehab OT Goals Patient Stated Goal: not stated OT Goal Formulation: With patient Time For Goal Achievement: 08/04/13 Potential to Achieve Goals: Good ADL Goals Pt Will Perform Grooming: with modified independence;standing Pt Will Perform Lower Body Bathing: with adaptive equipment;sit to/from stand;with modified independence Pt Will Perform Lower Body Dressing: with adaptive equipment;sit to/from stand;with modified independence Pt Will Transfer to Toilet: with modified independence;ambulating (3 in 1 over commode) Pt Will Perform Toileting - Clothing Manipulation and hygiene: with modified independence;with adaptive equipment;sit to/from stand;sitting/lateral leans Pt Will Perform Tub/Shower Transfer: with supervision;ambulating;rolling walker;Tub transfer (tub equipment tbd) Additional ADL Goal #1: Pt will be able to independently state 3/3 back precautions.  Additional ADL Goal #2: Pt will be Mod I for bed mobility as precursor for ADLs.   Visit Information  Last OT Received On: 07/29/13 Assistance Needed: +1 History of Present Illness: Pt is 67 yo female s/p EXPLORATION OF POSTERIOR LUMBAR  FUSION  REDO POSTERIOR LUMBAR INTERBODY FUSION LUMBAR THREE-FOUR WITH REMOVAL HARDWARE LUMBAR TWO TO LUMBAR FOUR     Subjective Data      Prior Functioning       Cognition  Cognition Arousal/Alertness: Awake/alert Behavior During Therapy: WFL for tasks  assessed/performed Overall Cognitive Status: Within Functional Limits for tasks assessed  Mobility  Bed Mobility Bed Mobility: Rolling Left;Left Sidelying to Sit;Sitting - Scoot to Edge of Bed Rolling Left: 4: Min guard Left Sidelying to Sit: 4: Min guard Sitting - Scoot to [place] of Bed: 5: Supervision Details for Bed Mobility Assistance: Cues for precautions Transfers Transfers: Sit to Stand;Stand to Sit Sit to Stand: 4: Min guard;From bed;From toilet;From chair/3-in-1;With upper extremity assist Stand to Sit: 4: Min guard;With upper extremity assist;To chair/3-in-1;To toilet Details for Transfer Assistance: Cues for hand placement.           End of Session OT - End of Session Equipment Utilized During Treatment: Gait belt;Rolling walker;Back brace Activity Tolerance: Patient tolerated treatment well Patient left: in chair;with call bell/phone within reach  GO     Earlie Raveling OTR/L [phone]  07/29/2013, 12:56 PM

## 2013-07-29 NOTE — Progress Notes (Signed)
Physical Therapy Treatment Patient Details Name: Amy [name] MRN: [id number] DOB: 1946/07/27 Today's Date: 07/29/2013 Time: 1040-1104 PT Time Calculation (min): 24 min  PT Assessment / Plan / Recommendation  History of Present Illness Pt is 67 yo female s/p EXPLORATION OF POSTERIOR LUMBAR  FUSION  REDO POSTERIOR LUMBAR INTERBODY FUSION LUMBAR THREE-FOUR WITH REMOVAL HARDWARE LUMBAR TWO TO LUMBAR FOUR    PT Comments   Pt able to improve gait today, gait with supervision 50'.  Pt with slow cadence, limited by pain during all transitional movements.  Will benefit from 24 hour assistance as she still requires assist for bed mobility and sit to stand.    Follow Up Recommendations  Home health PT;Supervision/Assistance - 24 hour     Does the patient have the potential to tolerate intense rehabilitation     Barriers to Discharge        Equipment Recommendations  Rolling walker with 5" wheels    Recommendations for Other Services    Frequency Min 5X/week   Progress towards PT Goals Progress towards PT goals: Progressing toward goals  Plan Current plan remains appropriate    Precautions / Restrictions Precautions Precautions: Back;Fall Required Braces or Orthoses: Spinal Brace Spinal Brace: Lumbar corset;Applied in sitting position Restrictions Weight Bearing Restrictions: No   Pertinent Vitals/Pain Pt c/o pain in back with transitional movements, eases with rest    Mobility  Bed Mobility Bed Mobility: Rolling Right;Sit to Supine Sit to Sidelying Right: 3: Mod assist Details for Bed Mobility Assistance: sit to supine, mod A for LEs, pt requires increased time and cues for log roll technique Transfers Sit to Stand: 4: Min assist;From chair/3-in-1 Stand to Sit: 4: Min assist;To bed Details for Transfer Assistance: cues for UE placement Ambulation/Gait Ambulation/Gait Assistance: 5: Supervision Ambulation Distance (Feet): 50 Feet Assistive device: Rolling  walker Ambulation/Gait Assistance Details: slow cadence, no LOB, good control of RW    Exercises     PT Diagnosis:    PT Problem List:   PT Treatment Interventions:     PT Goals (current goals can now be found in the care plan section)    Visit Information  Last PT Received On: 07/29/13 Assistance Needed: +1 History of Present Illness: Pt is 67 yo female s/p EXPLORATION OF POSTERIOR LUMBAR  FUSION  REDO POSTERIOR LUMBAR INTERBODY FUSION LUMBAR THREE-FOUR WITH REMOVAL HARDWARE LUMBAR TWO TO LUMBAR FOUR     Subjective Data      Cognition  Cognition Arousal/Alertness: Awake/alert Behavior During Therapy: WFL for tasks assessed/performed Overall Cognitive Status: Within Functional Limits for tasks assessed    Balance  Static Standing Balance Static Standing - Balance Support: Bilateral upper extremity supported Static Standing - Level of Assistance: 5: Stand by assistance Static Standing - Comment/# of Minutes: 3 minutes while completing meal order  End of Session PT - End of Session Equipment Utilized During Treatment: Gait belt;Back brace Activity Tolerance: Patient tolerated treatment well;Patient limited by fatigue Patient left: in bed;with call bell/phone within reach Nurse Communication: Mobility status   GP     DONAWERTH,KAREN 07/29/2013, 11:04 AM

## 2013-07-29 NOTE — Progress Notes (Signed)
Subjective: Patient reports that she's feeling better still some difficulty voiding again this is a chronic problem for her we'll continue to work on that throughout the day today  Objective: Vital signs in last 24 hours: Temp:  [97.8 F (36.6 C)-98.7 F (37.1 C)] 97.9 F (36.6 C) (11/05 2011) Pulse Rate:  [86-98] 97 (11/05 2011) Resp:  [18-19] 18 (11/05 2011) BP: (107-141)/(59-91) 119/61 mmHg (11/05 2011) SpO2:  [93 %-98 %] 97 % (11/05 2011)  Intake/Output from previous day: 11/04 0701 - 11/05 0700 In: 960 [P.O.:960] Out: 2109 [Urine:1949; Drains:160] Intake/Output this shift:    Awake alert oriented strength out of 5 wound clean and dry  Lab Results: No results found for this basename: WBC, HGB, HCT, PLT,  in the last 72 hours BMET No results found for this basename: NA, K, CL, CO2, GLUCOSE, BUN, CREATININE, CALCIUM,  in the last 72 hours  Studies/Results: No results found.  Assessment/Plan:  continue to work on her bladder work with physical therapy once she is able to urinate more regularly which is a chronic problem for will be low discharge her home.   LOS: 2 days     CRAM,GARY P 07/29/2013, 8:40 PM

## 2013-07-30 NOTE — Progress Notes (Signed)
Clinical Social Work Department CLINICAL SOCIAL WORK PLACEMENT NOTE 07/30/2013  Patient:  Amy [name], Amy [name]  Account Number:  [id number] Admit date:  07/27/2013  Clinical Social Worker:  Carren Rang  Date/time:  07/30/2013 01:29 PM  Clinical Social Work is seeking post-discharge placement for this patient at the following level of care:   SKILLED NURSING   (*CSW will update this form in Epic as items are completed)   07/30/2013  Patient/family provided with [facility] Department of Clinical Social Work's list of facilities offering this level of care within the geographic area requested by the patient (or if unable, by the patient's family).  07/30/2013  Patient/family informed of their freedom to choose among providers that offer the needed level of care, that participate in Medicare, Medicaid or managed care program needed by the patient, have an available bed and are willing to accept the patient.  07/30/2013  Patient/family informed of [facility]' ownership interest in [facility], as well as of the fact that they are under no obligation to receive care at this facility.  PASARR submitted to EDS on 07/30/2013 PASARR number received from EDS on   FL2 transmitted to all facilities in geographic area requested by pt/family on  07/30/2013 FL2 transmitted to all facilities within larger geographic area on   Patient informed that his/her managed care company has contracts with or will negotiate with  certain facilities, including the following:     Patient/family informed of bed offers received:   Patient chooses bed at  Physician recommends and patient chooses bed at    Patient to be transferred to  on   Patient to be transferred to facility by   The following physician request were entered in Epic:   Additional Comments:  Maree Krabbe, MSW, [employer] [phone]

## 2013-07-30 NOTE — Progress Notes (Signed)
UR complete.  Courtney Robarge RN, MSN 

## 2013-07-30 NOTE — Progress Notes (Signed)
Seen and agreed 07/30/2013 Fredrich Birks PTA [phone] pager [phone] office

## 2013-07-30 NOTE — Progress Notes (Signed)
Occupational Therapy Treatment Patient Details Name: Amy [name] MRN: [id number] DOB: July 12, 1946 Today's Date: 07/30/2013 Time: 8295-6213 OT Time Calculation (min): 35 min  OT Assessment / Plan / Recommendation  History of present illness Pt is 67 yo female s/p EXPLORATION OF POSTERIOR LUMBAR  FUSION  REDO POSTERIOR LUMBAR INTERBODY FUSION LUMBAR THREE-FOUR WITH REMOVAL HARDWARE LUMBAR TWO TO LUMBAR FOUR    OT comments  Pt performed Tub transfer, reviewed DME & adherence to back precautions today. Pt should benefit from wide 3:1 & tub transfer bench at d/c. D/c plan remains appropriate.  Follow Up Recommendations  Home health OT;Supervision/Assistance - 24 hour    Barriers to Discharge       Equipment Recommendations  Other (comment);3 in 1 bedside comode;Tub/shower bench (Larger size 3:1)    Recommendations for Other Services    Frequency Min 2X/week   Progress towards OT Goals Progress towards OT goals: Progressing toward goals  Plan Discharge plan remains appropriate    Precautions / Restrictions Precautions Precautions: Back;Fall Precaution Booklet Issued: Yes (comment) Precaution Comments: Pt recalled 3/3 precautions Required Braces or Orthoses: Spinal Brace Spinal Brace: Lumbar corset;Applied in sitting position Restrictions Weight Bearing Restrictions: No   Pertinent Vitals/Pain Pt guarded w/ functional mobility but did not state pain. RN in at conclusion of session w/ medications.    ADL  Tub/Shower Transfer: Performed;Minimal assistance Tub/Shower Transfer Method: Science writer: Emergency planning/management officer;Other (comment) (RW) Equipment Used: Gait belt;Back brace;Rolling walker;Other (comment) (Tub bench) Transfers/Ambulation Related to ADLs: Min guard assist for tub transfers today ADL Comments: Pt performed tub transfer in ADL gym using RW & tub bench. Pt was initially educated on tub transfers w/o DME and shown proper technique  adhering to back precautions, pt was then educated in use of shower chair & tub transfer bench. Pt reported that she was most comfortable using tub bench. Also discussed possible DME needs at d/c, pt expresses that she feels as though she would benefit from 3:1 & tub bench to assist in adhering to back precautions.. Handouts were issued/reviewed for showering as well as DME    OT Diagnosis:    OT Problem List:   OT Treatment Interventions:     OT Goals(current goals can now be found in the care plan section) Acute Rehab OT Goals Patient Stated Goal: Speak w/ social worker OT Goal Formulation: With patient Time For Goal Achievement: 08/04/13 Potential to Achieve Goals: Good  Visit Information  Last OT Received On: 07/30/13 Assistance Needed: +1 History of Present Illness: Pt is 67 yo female s/p EXPLORATION OF POSTERIOR LUMBAR  FUSION  REDO POSTERIOR LUMBAR INTERBODY FUSION LUMBAR THREE-FOUR WITH REMOVAL HARDWARE LUMBAR TWO TO LUMBAR FOUR     Subjective Data      Prior Functioning       Cognition  Cognition Arousal/Alertness: Awake/alert Behavior During Therapy: WFL for tasks assessed/performed Overall Cognitive Status: Within Functional Limits for tasks assessed    Mobility  Bed Mobility Bed Mobility: Not assessed Details for Bed Mobility Assistance: Pt recieved in recliner  Transfers Transfers: Sit to Stand;Stand to Sit Sit to Stand: 5: Supervision;From chair/3-in-1;Other (comment);4: Min assist (Min guard From tub bench) Stand to Sit: 5: Supervision;To chair/3-in-1;Other (comment);4: Min assist (Min guard to tub bench) Details for Transfer Assistance: Pt required no cues today for safety or hand placement with transfers              End of Session OT - End of Session Equipment Utilized During Treatment: Gait  belt;Rolling walker;Back brace Activity Tolerance: Patient tolerated treatment well Patient left: in chair;with call bell/phone within reach Nurse Communication:  Other (comment) (Pt request to speak w/ social worker)  GO     Charletta Cousin, Amy Beth Dixon 07/30/2013, 10:50 AM

## 2013-07-30 NOTE — Consult Note (Addendum)
WOC wound consult note Reason for Consult: Consult requested for wounds in abd/groin skin folds.  Area red, moist and macerated with partial thickness fissures and appearance consistent with intertrigo.  Dressing procedure/placement/frequency: Interdry silver impregnated fabric has already been applied to wick away drainage and provide antimicrobial benefits.  Continue use for 5 days to promote healing.  Instructions for use provided to staff nurses. Please re-consult if further assistance is needed.  Thank-you,  Cammie Mcgee MSN, RN, CWOCN, [place], CNS [phone]

## 2013-07-30 NOTE — Progress Notes (Signed)
Subjective: Patient reports That overall she's feeling okay this morning she still has some back pain is still difficult to manage with the pills but she feels like she is voiding better to  Objective: Vital signs in last 24 hours: Temp:  [97.9 F (36.6 C)-98.7 F (37.1 C)] 98 F (36.7 C) (11/06 0612) Pulse Rate:  [90-98] 90 (11/06 0612) Resp:  [18] 18 (11/06 0612) BP: (107-130)/(59-78) 128/78 mmHg (11/06 0612) SpO2:  [96 %-100 %] 97 % (11/06 0612)  Intake/Output from previous day: 11/05 0701 - 11/06 0700 In: 600 [P.O.:600] Out: 2650 [Urine:2650] Intake/Output this shift:    Mobilized today we'll continue to work on pain management she's not ready for discharge hopefully tomorrow  Lab Results: No results found for this basename: WBC, HGB, HCT, PLT,  in the last 72 hours BMET No results found for this basename: NA, K, CL, CO2, GLUCOSE, BUN, CREATININE, CALCIUM,  in the last 72 hours  Studies/Results: No results found.  Assessment/Plan: Progressive mobilization today continue with physical occupational therapy we'll plan discharge Hemovac later today  LOS: 3 days     CRAM,GARY P 07/30/2013, 7:27 AM

## 2013-07-30 NOTE — Progress Notes (Addendum)
ANTIBIOTIC CONSULT NOTE   Pharmacy Consult for vancomycin Indication: post-op prophylaxis with spinal drain in place  Allergies  Allergen Reactions  . Penicillins Rash  . Latex Other (See Comments)    Sores, blisters    Vital Signs: Temp: 98.8 F (37.1 C) (11/06 0950) Temp src: Oral (11/06 0950) BP: 131/59 mmHg (11/06 0950) Pulse Rate: 98 (11/06 0950) Intake/Output from previous day: 11/05 0701 - 11/06 0700 In: 600 [P.O.:600] Out: 4350 [Urine:3350; Drains:1000] Intake/Output from this shift: Total I/O In: 360 [P.O.:360] Out: 500 [Urine:500]  Labs: No results found for this basename: WBC, HGB, PLT, LABCREA, CREATININE,  in the last 72 hours Estimated Creatinine Clearance: 74.6 ml/min (by C-G formula based on Cr of 0.95). No results found for this basename: VANCOTROUGH, VANCOPEAK, VANCORANDOM, GENTTROUGH, GENTPEAK, GENTRANDOM, TOBRATROUGH, TOBRAPEAK, TOBRARND, AMIKACINPEAK, AMIKACINTROU, AMIKACIN,  in the last 72 hours   Assessment: 67 year old female s/p spinal surgery with drain in place. Orders to continue IV vancomycin post-op until discontinued by MD. Renal function is normal with last labs taken on 10/29. Will plan on checking bmet with vancomycin trough tomorrow.  Goal of Therapy:  Vancomycin trough level 10-15 mcg/ml  Plan:  Measure antibiotic drug levels at steady state Follow up culture results Vancomycin 1g q12 hours  Sheppard Coil PharmD., BCPS Clinical Pharmacist Pager [phone] 07/30/2013 12:19 PM

## 2013-07-30 NOTE — Progress Notes (Signed)
Clinical Social Work Department BRIEF PSYCHOSOCIAL ASSESSMENT 07/30/2013  Patient:  MASAE, LUKACS     Account Number:  [id number]     Admit date:  07/27/2013  Clinical Social Worker:  Carren Rang  Date/Time:  07/30/2013 01:22 PM  Referred by:  Care Management  Date Referred:  07/30/2013 Referred for  SNF Placement   Other Referral:   Interview type:  Patient Other interview type:    PSYCHOSOCIAL DATA Living Status:  FAMILY Admitted from facility:   Level of care:   Primary support name:  Jaci Lazier [phone] Primary support relationship to patient:  SPOUSE Degree of support available:   Supportive    CURRENT CONCERNS Current Concerns  Post-Acute Placement   Other Concerns:    SOCIAL WORK ASSESSMENT / PLAN Clinical Social Worker received referral for SNF placement at d/c. CSW introduced self and explained reason for visit. CSW explained SNF process and provided SNF packet to patient. Patient reported she is agreeable for SNF placement. Patient's preference is [facility]. CSW will complete FL2 for MD's signature and will update patient when bed offers are received.   Assessment/plan status:  Psychosocial Support/Ongoing Assessment of Needs Other assessment/ plan:   Information/referral to community resources:   SNF packet    PATIENT'S/FAMILY'S RESPONSE TO PLAN OF CARE: Patient is agreeable for SNF, stating she lives with her husband, but he works during the day and is concerned about her care at home while her husband is at work.

## 2013-07-30 NOTE — Progress Notes (Signed)
Physical Therapy Treatment Patient Details Name: Amy [name] MRN: [id number] DOB: 1945-10-15 Today's Date: 07/30/2013 Time: 4098-1191 PT Time Calculation (min): 29 min  PT Assessment / Plan / Recommendation  History of Present Illness Pt is 67 yo female s/p EXPLORATION OF POSTERIOR LUMBAR  FUSION  REDO POSTERIOR LUMBAR INTERBODY FUSION LUMBAR THREE-FOUR WITH REMOVAL HARDWARE LUMBAR TWO TO LUMBAR FOUR    PT Comments   Pt progressing well with ambulation today.  Pt has good safety awareness and able to maintain precautions with transfers and gait today.  Pt had mild concerns about going home, however after discussion pt prefers to go home and has family/friend support 24hours.  HH PT appropriate post DC for further improvements with functional independence. Will attempt stair training tomorrow.    Follow Up Recommendations  Home health PT;Supervision/Assistance - 24 hour     Does the patient have the potential to tolerate intense rehabilitation     Barriers to Discharge        Equipment Recommendations  Rolling walker with 5" wheels    Recommendations for Other Services    Frequency Min 5X/week   Progress towards PT Goals Progress towards PT goals: Progressing toward goals  Plan Current plan remains appropriate    Precautions / Restrictions Precautions Precautions: Back;Fall Precaution Booklet Issued: Yes (comment) Precaution Comments: Pt recalled 3/3 precautions Required Braces or Orthoses: Spinal Brace Spinal Brace: Lumbar corset;Applied in sitting position Restrictions Weight Bearing Restrictions: No   Pertinent Vitals/Pain 3/10 LBP this morning; nursing aware.      Mobility  Bed Mobility Bed Mobility: Not assessed Details for Bed Mobility Assistance: Pt recieved in recliner  Transfers Transfers: Sit to Stand;Stand to Sit Sit to Stand: 5: Supervision Stand to Sit: 5: Supervision Details for Transfer Assistance: Pt required no cues today for safety or hand  placement with transfers Ambulation/Gait Ambulation/Gait Assistance: 5: Supervision Ambulation Distance (Feet): 100 Feet Assistive device: Rolling walker Ambulation/Gait Assistance Details: Slow cadence- slightly increased with cues; VC's to stay closer to RW Gait Pattern: Step-through pattern;Shuffle Gait velocity: decreased General Gait Details: No dizziness or adverse sx's today with ambulation Wheelchair Mobility Wheelchair Mobility: No    Exercises     PT Diagnosis:    PT Problem List:   PT Treatment Interventions:     PT Goals (current goals can now be found in the care plan section)    Visit Information  Last PT Received On: 07/30/13 Assistance Needed: +1 History of Present Illness: Pt is 67 yo female s/p EXPLORATION OF POSTERIOR LUMBAR  FUSION  REDO POSTERIOR LUMBAR INTERBODY FUSION LUMBAR THREE-FOUR WITH REMOVAL HARDWARE LUMBAR TWO TO LUMBAR FOUR     Subjective Data      Cognition  Cognition Arousal/Alertness: Awake/alert Behavior During Therapy: WFL for tasks assessed/performed Overall Cognitive Status: Within Functional Limits for tasks assessed    Balance     End of Session PT - End of Session Equipment Utilized During Treatment: Gait belt;Back brace Activity Tolerance: Patient tolerated treatment well Patient left: in chair;with call bell/phone within reach Nurse Communication: Mobility status   GP     MinnickIrving Burton, SPTA 07/30/2013, 9:07 AM

## 2013-07-30 NOTE — Progress Notes (Signed)
Patient stable, ambulated twice in the hall way. Tolerated it well. Will continue to monitor.

## 2013-07-31 DIAGNOSIS — M129 Arthropathy, unspecified: Secondary | ICD-10-CM | POA: Diagnosis not present

## 2013-07-31 DIAGNOSIS — F411 Generalized anxiety disorder: Secondary | ICD-10-CM | POA: Diagnosis not present

## 2013-07-31 DIAGNOSIS — Z4789 Encounter for other orthopedic aftercare: Secondary | ICD-10-CM | POA: Diagnosis not present

## 2013-07-31 DIAGNOSIS — I10 Essential (primary) hypertension: Secondary | ICD-10-CM | POA: Diagnosis not present

## 2013-07-31 DIAGNOSIS — M48061 Spinal stenosis, lumbar region without neurogenic claudication: Secondary | ICD-10-CM | POA: Diagnosis not present

## 2013-07-31 DIAGNOSIS — E876 Hypokalemia: Secondary | ICD-10-CM | POA: Diagnosis not present

## 2013-07-31 DIAGNOSIS — R269 Unspecified abnormalities of gait and mobility: Secondary | ICD-10-CM | POA: Diagnosis not present

## 2013-07-31 DIAGNOSIS — IMO0001 Reserved for inherently not codable concepts without codable children: Secondary | ICD-10-CM | POA: Diagnosis not present

## 2013-07-31 DIAGNOSIS — M6281 Muscle weakness (generalized): Secondary | ICD-10-CM | POA: Diagnosis not present

## 2013-07-31 DIAGNOSIS — F329 Major depressive disorder, single episode, unspecified: Secondary | ICD-10-CM | POA: Diagnosis not present

## 2013-07-31 DIAGNOSIS — J45909 Unspecified asthma, uncomplicated: Secondary | ICD-10-CM | POA: Diagnosis not present

## 2013-07-31 DIAGNOSIS — R490 Dysphonia: Secondary | ICD-10-CM | POA: Diagnosis not present

## 2013-07-31 DIAGNOSIS — H409 Unspecified glaucoma: Secondary | ICD-10-CM | POA: Diagnosis not present

## 2013-07-31 DIAGNOSIS — R279 Unspecified lack of coordination: Secondary | ICD-10-CM | POA: Diagnosis not present

## 2013-07-31 DIAGNOSIS — K219 Gastro-esophageal reflux disease without esophagitis: Secondary | ICD-10-CM | POA: Diagnosis not present

## 2013-07-31 LAB — CREATININE, SERUM
Creatinine, Ser: 0.7 mg/dL (ref 0.50–1.10)
GFR calc Af Amer: >90 mL/min (ref >90)
GFR calc non Af Amer: 88 mL/min — ABNORMAL LOW (ref >90)

## 2013-07-31 MED ORDER — TRAMADOL HCL 50 MG PO TABS: 50.0000 mg | ORAL_TABLET | Freq: Two times a day (BID) | ORAL | Status: DC

## 2013-07-31 MED ORDER — POLYETHYLENE GLYCOL 3350 17 G PO PACK
17.0000 g | PACK | Freq: Every day | ORAL | Status: DC
  Administered 2013-07-31: 17 g via ORAL
  Filled 2013-07-31: qty 1

## 2013-07-31 MED ORDER — OXYCODONE-ACETAMINOPHEN 5-325 MG PO TABS: 1.0000 | ORAL_TABLET | ORAL | Status: DC | PRN

## 2013-07-31 MED ORDER — BISACODYL 10 MG RE SUPP
10.0000 mg | Freq: Every day | RECTAL | Status: DC | PRN
  Administered 2013-07-31: 10 mg via RECTAL
  Filled 2013-07-31: qty 1

## 2013-07-31 NOTE — Progress Notes (Addendum)
Patient stable, ready for discharge, awaiting on transportation from family.Assessments remain unchanged as at now.

## 2013-07-31 NOTE — Progress Notes (Signed)
Clinical Social Worker facilitated patient discharge by contacting the patient and facility, [facility]. Patient agreeable to this plan and arranging transport via family. CSW will sign off, as social work intervention is no longer needed.  Maree Krabbe, MSW, Theresia Majors [phone]

## 2013-07-31 NOTE — Progress Notes (Signed)
Clinical Social Work Department CLINICAL SOCIAL WORK PLACEMENT NOTE 07/31/2013  Patient:  Amy [name], Amy [name]  Account Number:  [id number] Admit date:  07/27/2013  Clinical Social Worker:  Carren Rang  Date/time:  07/30/2013 01:29 PM  Clinical Social Work is seeking post-discharge placement for this patient at the following level of care:   SKILLED NURSING   (*CSW will update this form in Epic as items are completed)   07/30/2013  Patient/family provided with [facility] Department of Clinical Social Work's list of facilities offering this level of care within the geographic area requested by the patient (or if unable, by the patient's family).  07/30/2013  Patient/family informed of their freedom to choose among providers that offer the needed level of care, that participate in Medicare, Medicaid or managed care program needed by the patient, have an available bed and are willing to accept the patient.  07/30/2013  Patient/family informed of [facility]' ownership interest in [facility], as well as of the fact that they are under no obligation to receive care at this facility.  PASARR submitted to EDS on 07/30/2013 PASARR number received from [employer] on   FL2 transmitted to all facilities in geographic area requested by pt/family on  07/30/2013 FL2 transmitted to all facilities within larger geographic area on   Patient informed that his/her managed care company has contracts with or will negotiate with  certain facilities, including the following:     Patient/family informed of bed offers received:  07/31/2013 Patient chooses bed at [facility] Physician recommends and patient chooses bed at    Patient to be transferred to [facility] on  07/31/2013 Patient to be transferred to facility by [facility]  The following physician request were entered in Epic:   Additional Comments:  Maree Krabbe, MSW, Theresia Majors [phone]

## 2013-07-31 NOTE — Progress Notes (Signed)
Patient left with family member and staff in a wheelchair to be discharged home. All education and discharge instructions given. Patient verbalized understanding.

## 2013-07-31 NOTE — Progress Notes (Signed)
RN attempted to call for report, was put on hold and could not talk to anybody.Pateint ready to be transported with family to the family. Assessments remain unchanged.

## 2013-07-31 NOTE — Progress Notes (Signed)
Patient has confirmed bed at [facility]. CSW will help with dc when patient is medically ready.  Maree Krabbe, MSW, Theresia Majors [phone]

## 2013-07-31 NOTE — Progress Notes (Signed)
Physical Therapy Treatment Patient Details Name: ABBE BULA MRN: [id number] DOB: [birth year] Today's Date: 07/31/2013 Time: 5956-3875 PT Time Calculation (min): 25 min  PT Assessment / Plan / Recommendation  History of Present Illness Pt is 67 yo female s/p EXPLORATION OF POSTERIOR LUMBAR  FUSION  REDO POSTERIOR LUMBAR INTERBODY FUSION LUMBAR THREE-FOUR WITH REMOVAL HARDWARE LUMBAR TWO TO LUMBAR FOUR    PT Comments   Pt progressing with ambulation; good safety awareness.  Pt maintained precautions with standing grooming activities. Pt wanting to know more about SNF because unsure of how much time husband will be at home.  Pt will benefit from further PT to increase functional independence and 24 hour supervision for safety.  Follow Up Recommendations  Home health PT;Supervision/Assistance - 24 hour     Does the patient have the potential to tolerate intense rehabilitation     Barriers to Discharge        Equipment Recommendations  Rolling walker with 5" wheels    Recommendations for Other Services    Frequency Min 5X/week   Progress towards PT Goals Progress towards PT goals: Progressing toward goals  Plan Current plan remains appropriate    Precautions / Restrictions Precautions Precautions: Back;Fall Precaution Booklet Issued: Yes (comment) Precaution Comments: Pt recalled 3/3 precautions Required Braces or Orthoses: Spinal Brace Spinal Brace: Lumbar corset;Applied in sitting position Restrictions Weight Bearing Restrictions: No   Pertinent Vitals/Pain 3/10 pain this morning; declined request for pain med     Mobility  Bed Mobility Bed Mobility: Not assessed Details for Bed Mobility Assistance: Pt received in bathroom; wanted to return to recliner for breakfast Transfers Transfers: Sit to Stand;Stand to Sit Sit to Stand: 5: Supervision;From chair/3-in-1 Stand to Sit: 5: Supervision;To chair/3-in-1 Ambulation/Gait Ambulation/Gait Assistance: 5:  Supervision Ambulation Distance (Feet): 150 Feet Assistive device: Rolling walker Ambulation/Gait Assistance Details: Cadence increases with VC's but decreases within 1 min; VC's to stay closer to RW Gait Pattern: Step-through pattern;Decreased stride length Gait velocity: decreased Stairs: No    Exercises General Exercises - Lower Extremity Ankle Circles/Pumps: AROM;10 reps;Both   PT Diagnosis:    PT Problem List:   PT Treatment Interventions:     PT Goals (current goals can now be found in the care plan section)    Visit Information  Last PT Received On: 07/31/13 Assistance Needed: +1 History of Present Illness: Pt is 67 yo female s/p EXPLORATION OF POSTERIOR LUMBAR  FUSION  REDO POSTERIOR LUMBAR INTERBODY FUSION LUMBAR THREE-FOUR WITH REMOVAL HARDWARE LUMBAR TWO TO LUMBAR FOUR     Subjective Data      Cognition  Cognition Arousal/Alertness: Awake/alert Behavior During Therapy: WFL for tasks assessed/performed Overall Cognitive Status: Within Functional Limits for tasks assessed    Balance     End of Session PT - End of Session Equipment Utilized During Treatment: Gait belt;Back brace Activity Tolerance: Patient tolerated treatment well Patient left: in chair;with call bell/phone within reach Nurse Communication: Mobility status   GP     MinnickIrving Burton, SPTA 07/31/2013, 9:11 AM

## 2013-07-31 NOTE — Progress Notes (Signed)
Patient ID: Amy [name], female   DOB: March 09, 1946, 67 y.o.   MRN: [id number] Is doing great and pain is managed she has a bad the confirmed -- in place we will transfer her today.

## 2013-07-31 NOTE — Progress Notes (Signed)
Seen and agreed 07/31/2013 Robinette, Julia Elizabeth PTA [phone] pager [phone] office    

## 2013-07-31 NOTE — Discharge Summary (Addendum)
Physician Discharge Summary  Patient ID: Amy [name] MRN: [id number] DOB/AGE: [birth year] 67 y.o.  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 With replacement of her L4 pedicle screws postoperative patient did very well with recovered in the floor patient on the floor was progressing well with some initial difficulties imaging or bladder however this is a chronic issue that eventually resolved. Her preoperative radicular symptoms were cut and significantly improved. Her back pain became under better control on oral analgesics. And by postop day 4 she was stable for discharge and transfer to a skilled nursing facility. At the time of discharge she was ambulating and voiding spontaneously tolerating rigid diet pain was well controlled on pills.  Consults: Significant Diagnostic Studies: Treatments: L3 for revision of spinal fusion Discharge Exam: Blood pressure 114/48, pulse 98, temperature 98.4 F (36.9 C), temperature source Oral, resp. rate 18, height 5\' 5"  (1.651 m), weight 120 kg (264 lb 8.8 oz), SpO2 98.00%. Strength is 5 out of 5 wound is clean and dry  [facility]     Medication List         acetaminophen 500 MG tablet  Commonly known as:  TYLENOL  Take 1,000 mg by mouth every 8 (eight) hours as needed for pain.     azelaic acid 20 % cream  Commonly known as:  AZELEX  Apply 1 application topically 2 (two) times daily. After skin is thoroughly washed and patted dry, gently but thoroughly massage a thin film of azelaic acid cream into the affected area twice daily, in the morning and evening.     calcium citrate-vitamin D  315-200 MG-UNIT per tablet  Commonly known as:  CITRACAL+D  Take 1 tablet by mouth daily.     celecoxib 200 MG capsule  Commonly known as:  CELEBREX  Take 200 mg by mouth at bedtime.     cholecalciferol 1000 UNITS tablet  Commonly known as:  VITAMIN D  Take 1,000 Units by mouth daily.     diphenhydrAMINE 25 mg capsule  Commonly known as:  BENADRYL  Take 25 mg by mouth 2 (two) times daily as needed for allergies.     esomeprazole 40 MG capsule  Commonly known as:  NEXIUM  Take 40 mg by mouth 2 (two) times daily.     felodipine 10 MG 24 hr tablet  Commonly known as:  PLENDIL  Take 10 mg by mouth every morning.     glycopyrrolate 2 MG tablet  Commonly known as:  ROBINUL  Take 2 mg by mouth 2 (two) times daily.     losartan-hydrochlorothiazide 100-25 MG per tablet  Commonly known as:  HYZAAR  Take 1 tablet by mouth every morning.     multivitamin with minerals Tabs tablet  Take 1 tablet by mouth daily.     oxyCODONE-acetaminophen 5-325 MG per tablet  Commonly known as:  PERCOCET/ROXICET  Take 1-2 tablets by mouth every 4 (four) hours as needed for moderate pain.     PARoxetine 30 MG tablet  Commonly known as:  PAXIL  Take 30 mg by mouth at bedtime.     POTASSIUM PO  Take 1 tablet by mouth daily.     silodosin 8 MG Caps  capsule  Commonly known as:  RAPAFLO  Take 8 mg by mouth daily as needed (for bladder).     traMADol 100 MG 24 hr tablet  Commonly known as:  ULTRAM-ER  Take 100 mg by mouth daily as needed for pain.     traMADol 50 MG tablet  Commonly known as:  ULTRAM  Take 1 tablet (50 mg total) by mouth 2 (two) times daily.     VITAMIN B COMPLEX PO  Take 1 tablet by mouth daily.     vitamin E 400 UNIT capsule  Generic drug:  vitamin E  Take 400 Units by mouth daily.           Follow-up Information   Follow up with [facility], MD.   Specialty:  Neurosurgery   Contact information:   [address] [place] [address] [phone]        Signed: CRAM,GARY P 07/31/2013, 1:07 PM

## 2013-08-06 ENCOUNTER — Non-Acute Institutional Stay (SKILLED_NURSING_FACILITY): Payer: Medicare Other | Admitting: Internal Medicine

## 2013-08-06 ENCOUNTER — Encounter: Payer: Self-pay | Admitting: Internal Medicine

## 2013-08-06 DIAGNOSIS — F329 Major depressive disorder, single episode, unspecified: Secondary | ICD-10-CM | POA: Diagnosis not present

## 2013-08-06 DIAGNOSIS — K219 Gastro-esophageal reflux disease without esophagitis: Secondary | ICD-10-CM | POA: Insufficient documentation

## 2013-08-06 DIAGNOSIS — E876 Hypokalemia: Secondary | ICD-10-CM | POA: Insufficient documentation

## 2013-08-06 DIAGNOSIS — I10 Essential (primary) hypertension: Secondary | ICD-10-CM | POA: Diagnosis not present

## 2013-08-06 DIAGNOSIS — M129 Arthropathy, unspecified: Secondary | ICD-10-CM

## 2013-08-06 DIAGNOSIS — M48061 Spinal stenosis, lumbar region without neurogenic claudication: Secondary | ICD-10-CM | POA: Insufficient documentation

## 2013-08-06 DIAGNOSIS — F32A Depression, unspecified: Secondary | ICD-10-CM

## 2013-08-06 DIAGNOSIS — N3289 Other specified disorders of bladder: Secondary | ICD-10-CM

## 2013-08-06 DIAGNOSIS — M1990 Unspecified osteoarthritis, unspecified site: Secondary | ICD-10-CM

## 2013-08-06 NOTE — Progress Notes (Signed)
Patient ID: Amy [name], female   DOB: 1946-08-29, 67 y.o.   MRN: [id number]  [facility]    PCP: Astrid Divine, MD  Code Status: dnr  Allergies  Allergen Reactions  . Penicillins Rash  . Latex Other (See Comments)    Sores, blisters    Chief Complaint  Patient presents with  . Hospitalization Follow-up    new admit     HPI:  67 y/o female patient is here post hospitalization 07/27/13- 07/31/13 with lumbar spinal stenosis and pseudoarthrosis L3-4. She underwent revision of spinal fusion for pseudoarthrosis and replacement of L4 pedicle screw. Her radicular pain symptoms improved and she is now here to undergo rehabilitation. She is seen in her room today and denies any complaints  Review of Systems  Constitutional: Negative for fever, chills, weight loss, malaise/fatigue and diaphoresis.  HENT: Negative for congestion, hearing loss and sore throat.   Eyes: Negative for blurred vision, double vision and discharge.  Respiratory: Negative for cough, sputum production, shortness of breath and wheezing.   Cardiovascular: Negative for chest pain, palpitations, orthopnea and leg swelling.  Gastrointestinal: Negative for heartburn, nausea, vomiting, abdominal pain, diarrhea and constipation.  Genitourinary: Negative for dysuria, urgency, frequency and flank pain.  Musculoskeletal: Negative for  falls, joint pain and myalgias.  Skin: Negative for itching and rash.  Neurological: Positive for weakness. Negative for dizziness, tingling, focal weakness and headaches.  Psychiatric/Behavioral: Negative for depression and memory loss. The patient is not nervous/anxious.     Past Medical History  Diagnosis Date  . Anxiety   . Dysphonia     intermittent "voice changes"  . Chronic back pain     steroid injection last 01-20-13 next 02-19-13  . Arthritis     spinal fusion-lumbar; reains with chronic pain, steroid injections at present  . History of blood clots      tx. '89-Heparin and Coumadin-affected left arm  . Hypertension     takes Hyzaar and Plendil daily  . History of blood clots     right arm-about 91yrs ago  . Sleep apnea     Dx.unable to tolerate cpap-no machine now  . Seasonal allergies     takes Benadryl daily prn  . Asthma     pt was told she has "sleeping asthma"  . Headache(784.0)     occasionally  . Fibromyalgia   . Weakness     both hands and feet  . Chronic back pain   . Constipation   . Urinary frequency     takes Rapaflo daily prn  . History of blood transfusion   . Glaucoma   . Depression     takes Paxil daily  . Insomnia     occasionally and no meds required  . History of MRSA infection 2011  . GERD (gastroesophageal reflux disease)     prone to throat spasms related to reflux; takes Nexium daily and Robinul bid   Past Surgical History  Procedure Laterality Date  . Abdominal hysterectomy    . Spinal fusion      Titanium hardware  . Nose surgery    . Bone graft hip iliac crest Right     for lumbar spinal surgery/fusion  . Back surgery  02-09-13    2012 - back surgery  . Mass excision Left 02/20/2013    Procedure: EXCISION LEFT BUTTOCK  MASS;  Surgeon: Ardeth Sportsman, MD;  Location: [facility];  Service: General;  Laterality: Left;  . Lipoma excision  01/2013  . Esophagogastroduodenoscopy N/A 07/15/2013    Procedure: ESOPHAGOGASTRODUODENOSCOPY (EGD);  Surgeon: Vertell Novak., MD;  Location: [facility];  Service: Endoscopy;  Laterality: N/A;  need xray  . Abdominal adhesions removed    . Colonoscopy     Social History:   reports that she has never smoked. She has never used smokeless tobacco. She reports that she does not drink alcohol or use illicit drugs.  Family History  Problem Relation Age of Onset  . Hypertension Mother   . Cancer Mother     cervix  . Kidney disease Mother   . Hypertension Sister   . Cancer Sister     polyps  . Kidney disease Brother   . Cancer Daughter     leukemia   . Hypertension Brother     Medications: Patient's Medications  New Prescriptions   No medications on file  Previous Medications   ACETAMINOPHEN (TYLENOL) 500 MG TABLET    Take 1,000 mg by mouth every 8 (eight) hours as needed for pain.   AZELAIC ACID (AZELEX) 20 % CREAM    Apply 1 application topically 2 (two) times daily. After skin is thoroughly washed and patted dry, gently but thoroughly massage a thin film of azelaic acid cream into the affected area twice daily, in the morning and evening.   B COMPLEX VITAMINS (VITAMIN B COMPLEX PO)    Take 1 tablet by mouth daily.   CALCIUM CITRATE-VITAMIN D (CITRACAL+D) 315-200 MG-UNIT PER TABLET    Take 1 tablet by mouth daily.   CELECOXIB (CELEBREX) 200 MG CAPSULE    Take 200 mg by mouth at bedtime.   CHOLECALCIFEROL (VITAMIN D) 1000 UNITS TABLET    Take 1,000 Units by mouth daily.   DIPHENHYDRAMINE (BENADRYL) 25 MG CAPSULE    Take 25 mg by mouth 2 (two) times daily as needed for allergies.   ESOMEPRAZOLE (NEXIUM) 40 MG CAPSULE    Take 40 mg by mouth 2 (two) times daily.    FELODIPINE (PLENDIL) 10 MG 24 HR TABLET    Take 10 mg by mouth every morning.    GLYCOPYRROLATE (ROBINUL) 2 MG TABLET    Take 2 mg by mouth 2 (two) times daily.    LOSARTAN-HYDROCHLOROTHIAZIDE (HYZAAR) 100-25 MG PER TABLET    Take 1 tablet by mouth every morning.    MULTIPLE VITAMIN (MULTIVITAMIN WITH MINERALS) TABS    Take 1 tablet by mouth daily.   OXYCODONE-ACETAMINOPHEN (PERCOCET/ROXICET) 5-325 MG PER TABLET    Take 1-2 tablets by mouth every 4 (four) hours as needed for moderate pain.   PAROXETINE (PAXIL) 30 MG TABLET    Take 30 mg by mouth at bedtime.    POTASSIUM PO    Take 1 tablet by mouth daily.   SILODOSIN (RAPAFLO) 8 MG CAPS CAPSULE    Take 8 mg by mouth daily as needed (for bladder).    TRAMADOL (ULTRAM) 50 MG TABLET    Take 1 tablet (50 mg total) by mouth 2 (two) times daily.   TRAMADOL (ULTRAM-ER) 100 MG 24 HR TABLET    Take 100 mg by mouth daily as needed for  pain.    VITAMIN E (VITAMIN E) 400 UNIT CAPSULE    Take 400 Units by mouth daily.  Modified Medications   No medications on file  Discontinued Medications   No medications on file     Physical Exam: BP 140/86  Pulse 81  Temp(Src) 97.6 F (36.4 C)  Resp 18  SpO2  96%  General- elderly female in no acute distress, obese Head- atraumatic, normocephalic Eyes- PERRLA, EOMI, no pallor, no icterus, no discharge Neck- no lymphadenopathy, no thyromegaly, no jugular vein distension, no carotid bruit Chest- no chest wall deformities, no chest wall tenderness Cardiovascular- normal s1,s2, no murmurs/ rubs/ gallops Respiratory- bilateral clear to auscultation, no wheeze, no rhonchi, no crackles Abdomen- bowel sounds present, soft, non tender Musculoskeletal- able to move all 4 extremities, incision healing well, dressing in place Neurological- no focal deficit Psychiatry- alert and oriented to person, place and time, normal mood and affect   Labs reviewed: Basic Metabolic Panel:  Recent Labs  40/98/11 0930 07/22/13 1030 07/31/13 1312  NA 140 138  --   K 4.4 3.6  --   CL 101 99  --   CO2 32 27  --   GLUCOSE 100* 107*  --   BUN 29* 17  --   CREATININE 1.17* 0.95 0.70  CALCIUM 9.6 9.3  --    CBC:  Recent Labs  02/09/13 0930 07/22/13 1030  WBC 4.5 4.3  HGB 13.6 13.6  HCT 40.2 39.6  MCV 89.1 90.0  PLT 339 326    Assessment/Plan  Lumbar spinal stenosis- s/p fusion and here for rehab. Working well with PT and OT. Fall precautions. Pain under control. Continue tramadol 50 mg bid with 100 mg prn daily for pain. Also on oxycodone 5-325 1-2 tab q4h prn for pain.w ill change this to every 6 h prn for now with her pain being under control  Bladder spasm- continue rapaflo, symptom under control, monitor clinically  Arthritis- continue calcium vitamin d supplement, continue celecoxib for pain  gerd- symptoms controlled, continue nexium  Hypertension- bp controlled, continue  felodipine 10 mg daily for now with hyzaar 100-25 daily  Depression- mood stable, continue paxil home regimen  Hypokalemia- check bmp, continue kcl supplement for now   Family/ staff Communication: reviewed care plan with patient and nursing supervisor   Goals of care: to return home   Labs/tests ordered: cbc, cmp

## 2013-08-11 ENCOUNTER — Ambulatory Visit (HOSPITAL_COMMUNITY)
Admission: RE | Admit: 2013-08-11 | Discharge: 2013-08-11 | Disposition: A | Discharge status: Home / Self Care | Payer: Medicare Other | Source: Ambulatory Visit | Attending: Vascular Surgery | Admitting: Vascular Surgery

## 2013-08-11 ENCOUNTER — Other Ambulatory Visit (HOSPITAL_COMMUNITY): Payer: Self-pay | Admitting: Neurosurgery

## 2013-08-11 DIAGNOSIS — M7989 Other specified soft tissue disorders: Secondary | ICD-10-CM | POA: Insufficient documentation

## 2013-08-11 NOTE — Progress Notes (Signed)
Preliminary result given to Claybon Jabs @ Dr. Dola Argyle office via telephone approximately 14:50.

## 2013-08-12 DIAGNOSIS — I10 Essential (primary) hypertension: Secondary | ICD-10-CM | POA: Diagnosis not present

## 2013-08-12 DIAGNOSIS — R269 Unspecified abnormalities of gait and mobility: Secondary | ICD-10-CM

## 2013-08-12 DIAGNOSIS — M48061 Spinal stenosis, lumbar region without neurogenic claudication: Secondary | ICD-10-CM

## 2013-08-12 DIAGNOSIS — F3289 Other specified depressive episodes: Secondary | ICD-10-CM | POA: Diagnosis not present

## 2013-08-12 DIAGNOSIS — Z4789 Encounter for other orthopedic aftercare: Secondary | ICD-10-CM | POA: Diagnosis not present

## 2013-08-14 DIAGNOSIS — I10 Essential (primary) hypertension: Secondary | ICD-10-CM | POA: Diagnosis not present

## 2013-08-14 DIAGNOSIS — L299 Pruritus, unspecified: Secondary | ICD-10-CM | POA: Diagnosis not present

## 2013-09-02 ENCOUNTER — Other Ambulatory Visit: Payer: Self-pay

## 2013-09-02 DIAGNOSIS — Z1231 Encounter for screening mammogram for malignant neoplasm of breast: Secondary | ICD-10-CM

## 2013-09-03 DIAGNOSIS — IMO0002 Reserved for concepts with insufficient information to code with codable children: Secondary | ICD-10-CM | POA: Diagnosis not present

## 2013-09-03 DIAGNOSIS — M549 Dorsalgia, unspecified: Secondary | ICD-10-CM | POA: Diagnosis not present

## 2013-09-03 DIAGNOSIS — E669 Obesity, unspecified: Secondary | ICD-10-CM | POA: Diagnosis not present

## 2013-09-03 DIAGNOSIS — Z8781 Personal history of (healed) traumatic fracture: Secondary | ICD-10-CM | POA: Insufficient documentation

## 2013-09-10 DIAGNOSIS — G9009 Other idiopathic peripheral autonomic neuropathy: Secondary | ICD-10-CM | POA: Diagnosis not present

## 2013-09-10 DIAGNOSIS — L608 Other nail disorders: Secondary | ICD-10-CM | POA: Diagnosis not present

## 2013-09-10 DIAGNOSIS — S8420XA Injury of cutaneous sensory nerve at lower leg level, unspecified leg, initial encounter: Secondary | ICD-10-CM | POA: Diagnosis not present

## 2013-09-10 DIAGNOSIS — G609 Hereditary and idiopathic neuropathy, unspecified: Secondary | ICD-10-CM | POA: Diagnosis not present

## 2013-09-11 DIAGNOSIS — B372 Candidiasis of skin and nail: Secondary | ICD-10-CM | POA: Diagnosis not present

## 2013-09-11 DIAGNOSIS — L608 Other nail disorders: Secondary | ICD-10-CM | POA: Diagnosis not present

## 2013-09-22 IMAGING — RF DG ESOPHAGUS
13 of 16 series · 19 of 24 positions shown · non-contrast
Comparison: None

CLINICAL DATA: Dysphagia

ESOPHAGUS/BARIUM SWALLOW/TABLET STUDY
Fluoroscopy Time: 1-minute-97-second

[Series 1: run · 1 of 1 slices shown (1 of 13)]
[im 1/1]
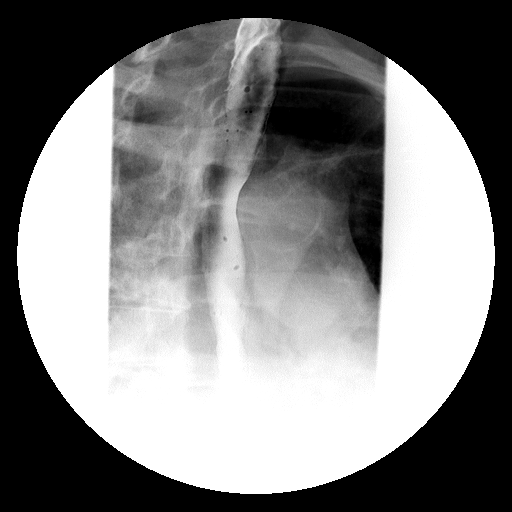

[Series 2: run · 1 of 1 slices shown (2 of 13)]
[im 1/1]
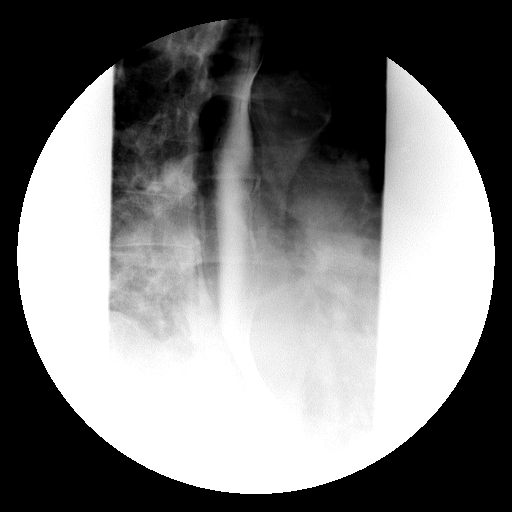

[Series 4: run · 1 of 1 slices shown (3 of 13)]
[im 1/1]
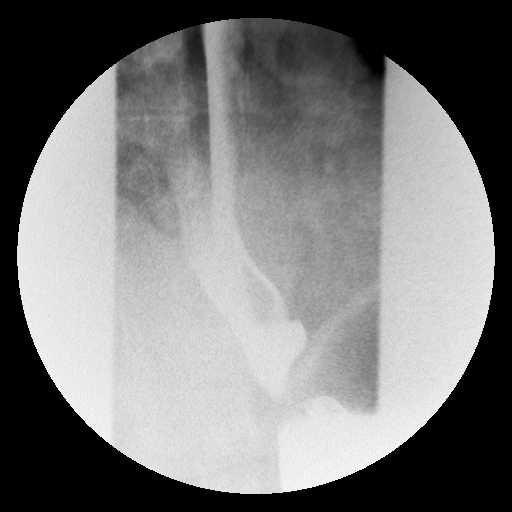

[Series 5: run · 1 of 1 slices shown (4 of 13)]
[im 1/1]
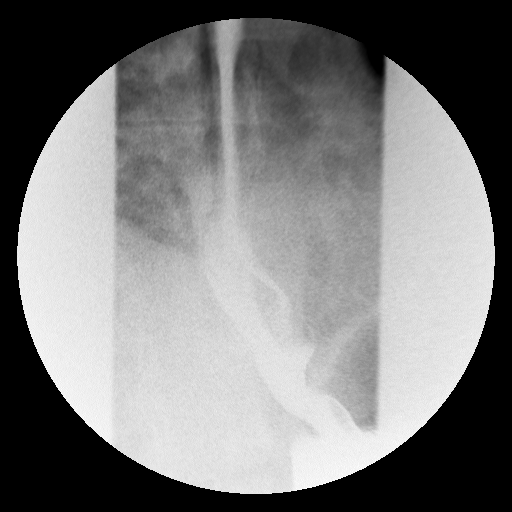

[Series 6: run · 1 of 1 slices shown (5 of 13)]
[im 1/1]
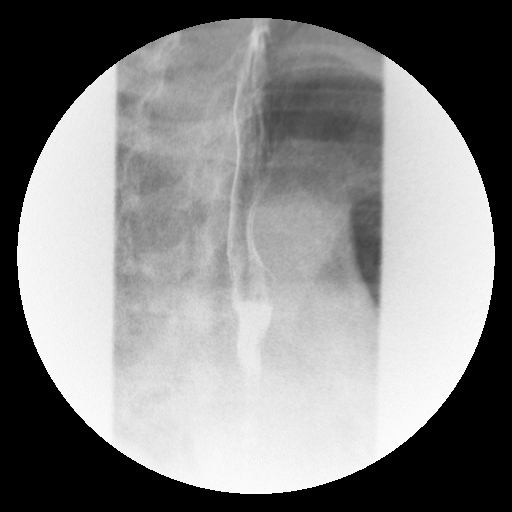

[Series 7: run · 3 of 11 slices shown (6 of 13)]
[im 1/11]
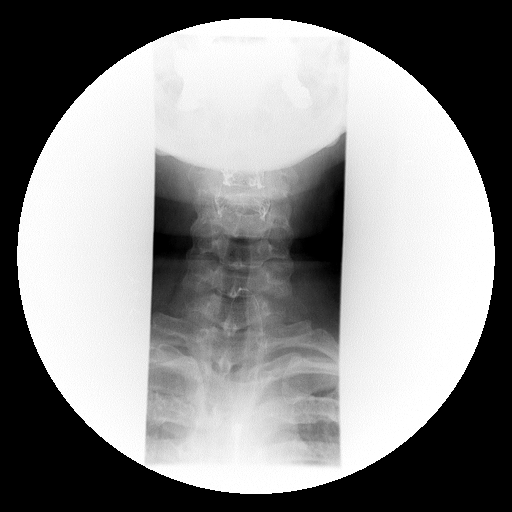
[im 7/11]
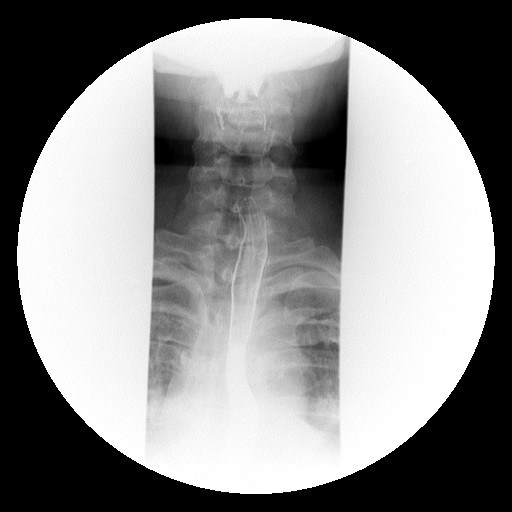
[im 11/11]
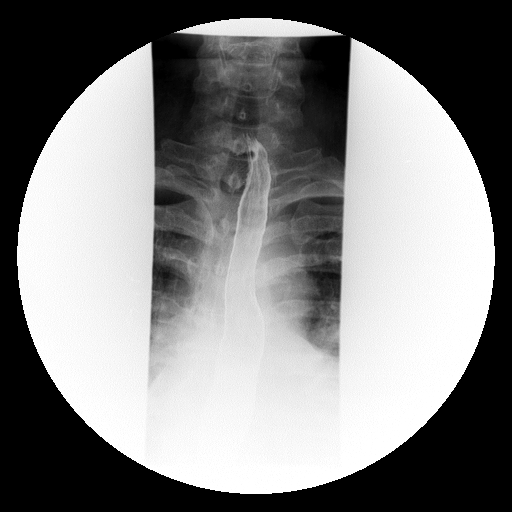

[Series 8: run · 5 of 13 slices shown (7 of 13)]
[im 1/13]
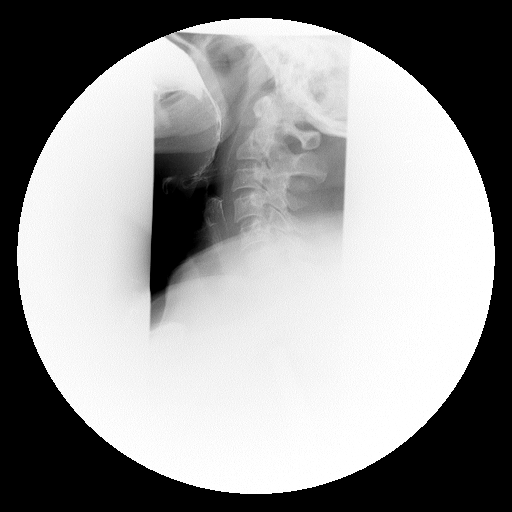
[im 5/13]
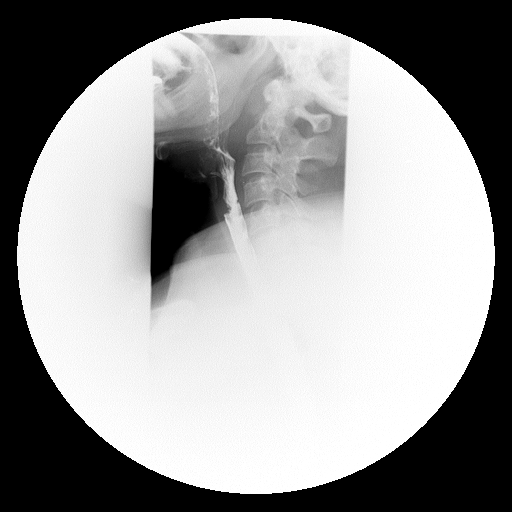
[im 8/13]
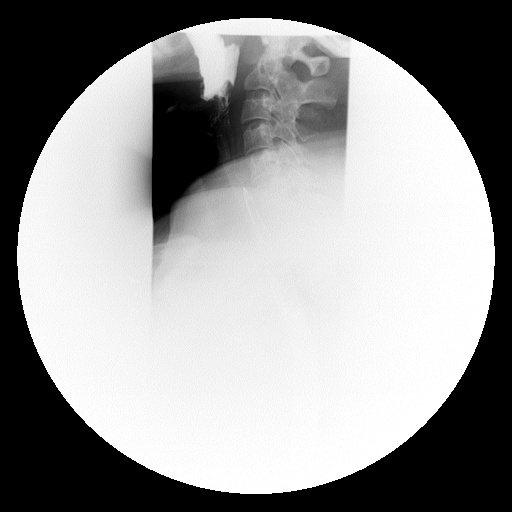
[im 10/13]
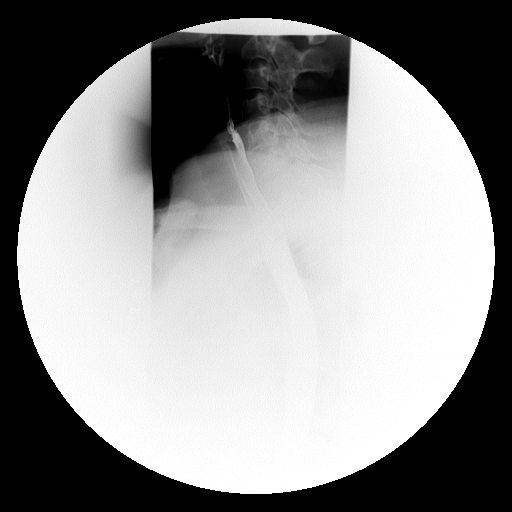
[im 13/13]
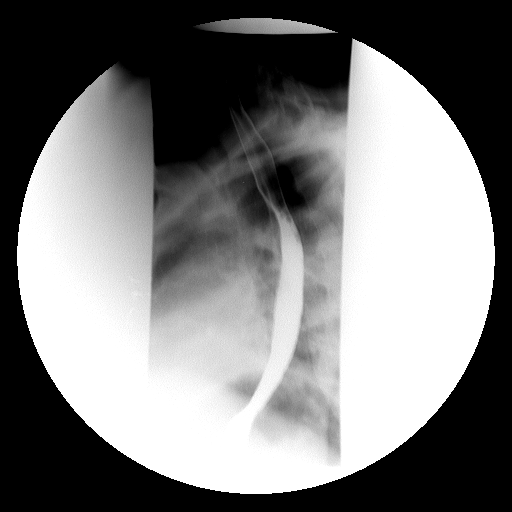

[Series 10: run · 1 of 1 slices shown (8 of 13)]
[im 1/1]
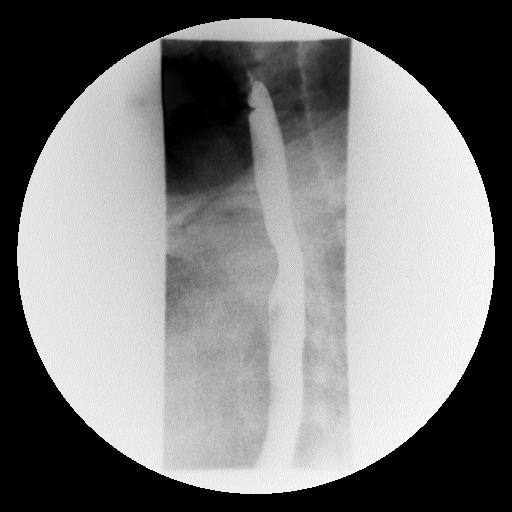

[Series 11: run · 1 of 1 slices shown (9 of 13)]
[im 1/1]
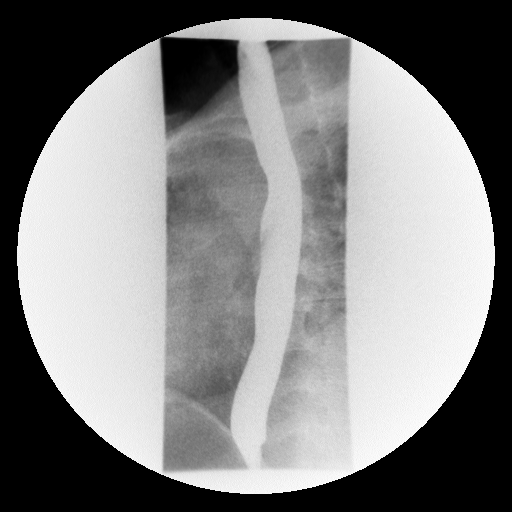

[Series 12: run · 1 of 1 slices shown (10 of 13)]
[im 1/1]
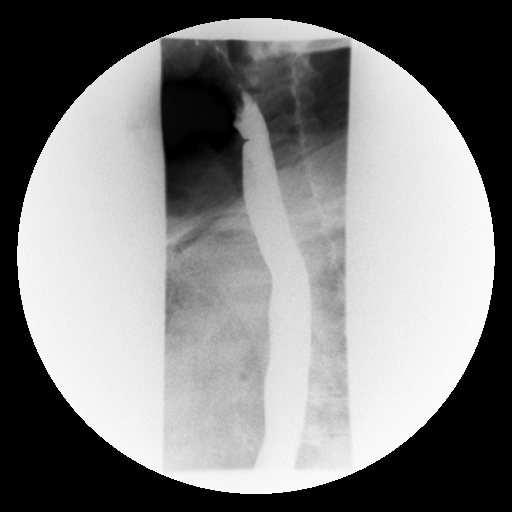

[Series 13: run · 1 of 1 slices shown (11 of 13)]
[im 1/1]
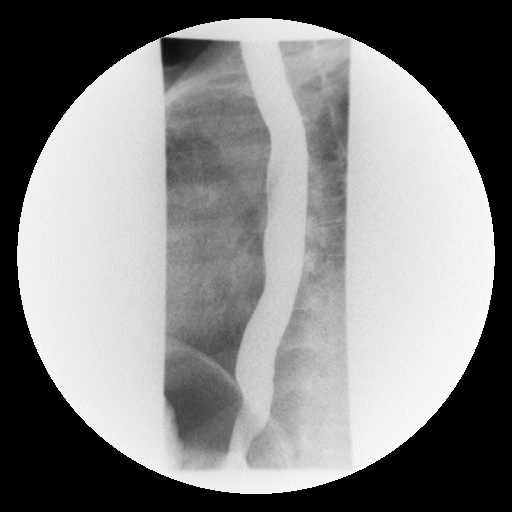

[Series 15: run · 1 of 1 slices shown (12 of 13)]
[im 1/1]
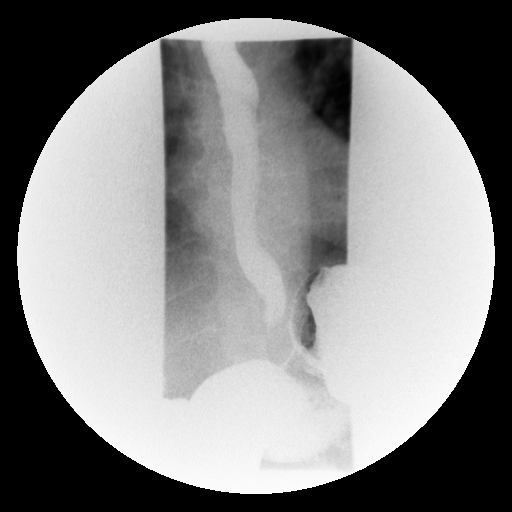

[Series 16: run · 1 of 1 slices shown (13 of 13)]
[im 1/1]
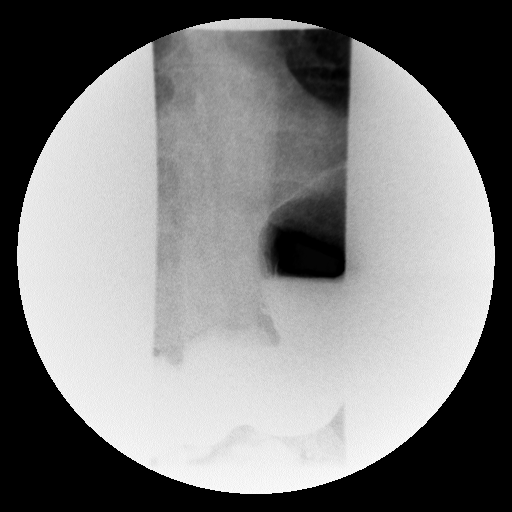

[19 of 24 positions shown; findings below may reference images not displayed]

FINDINGS: Initially a double contrast study was performed.  The
mucosa of the esophagus is unremarkable.  Rapid sequence spot films
of the cervical esophagus were performed.  There does appear to be
a lower cervical esophageal web present somewhat narrowing the
lower cervical esophageal lumen.  Mild tertiary contractions are
noted in the mid and distal esophagus.  No hiatal hernia is seen
and no reflux is demonstrated.  A barium pill was given at the end
of the study which passed into the stomach without delay.
IMPRESSION: 1.  Mild tertiary contractions.
2.  Mild to lower cervical esophageal web of questionable
significance.
3.  No hiatal hernia or reflux.

## 2013-10-02 ENCOUNTER — Ambulatory Visit: Payer: Medicare Other

## 2013-10-05 DIAGNOSIS — L608 Other nail disorders: Secondary | ICD-10-CM | POA: Diagnosis not present

## 2013-10-05 DIAGNOSIS — G9009 Other idiopathic peripheral autonomic neuropathy: Secondary | ICD-10-CM | POA: Diagnosis not present

## 2013-10-05 DIAGNOSIS — M7750 Other enthesopathy of unspecified foot: Secondary | ICD-10-CM | POA: Diagnosis not present

## 2013-10-09 ENCOUNTER — Ambulatory Visit: Payer: Medicare Other

## 2013-10-12 DIAGNOSIS — R51 Headache: Secondary | ICD-10-CM | POA: Diagnosis not present

## 2013-10-12 DIAGNOSIS — F4321 Adjustment disorder with depressed mood: Secondary | ICD-10-CM | POA: Diagnosis not present

## 2013-10-12 DIAGNOSIS — G609 Hereditary and idiopathic neuropathy, unspecified: Secondary | ICD-10-CM | POA: Diagnosis not present

## 2013-10-15 ENCOUNTER — Ambulatory Visit
Admission: RE | Admit: 2013-10-15 | Discharge: 2013-10-15 | Disposition: A | Discharge status: Home / Self Care | Payer: Medicare Other | Source: Ambulatory Visit

## 2013-10-15 DIAGNOSIS — Z1231 Encounter for screening mammogram for malignant neoplasm of breast: Secondary | ICD-10-CM

## 2013-10-29 DIAGNOSIS — M549 Dorsalgia, unspecified: Secondary | ICD-10-CM | POA: Diagnosis not present

## 2013-10-29 DIAGNOSIS — IMO0002 Reserved for concepts with insufficient information to code with codable children: Secondary | ICD-10-CM | POA: Diagnosis not present

## 2013-12-18 IMAGING — CT CT L SPINE W/O CM
4 of 9 series · 12 of 33 positions shown, 14 images · non-contrast
Comparison: Lumbar spine CT scan 09/03/2012.

CLINICAL DATA: Low back pain. Bilateral leg pain, numbness and
tingling. History of prior lumbar surgery.

EXAM:
CT LUMBAR SPINE WITHOUT CONTRAST
TECHNIQUE: Multidetector CT imaging of the lumbar spine was performed without
intravenous contrast administration. Multiplanar CT image
reconstructions were also generated.

[Series 4: l spine bone · axial · 0.27mm/px · z∈[-22,+60]mm · 2 of 99 slices shown, 3 images]
[im 33/99  soft-tissue]
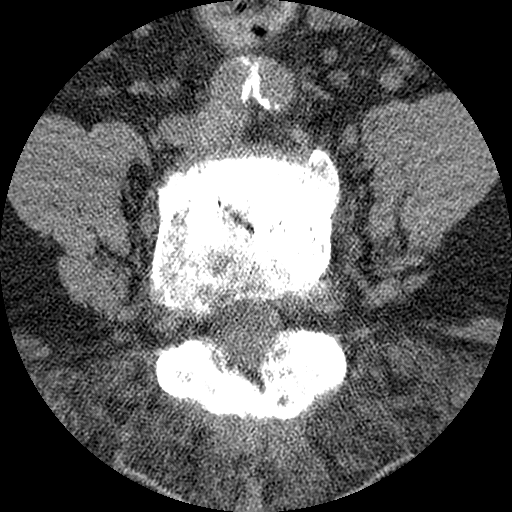
[im 33/99  bone]
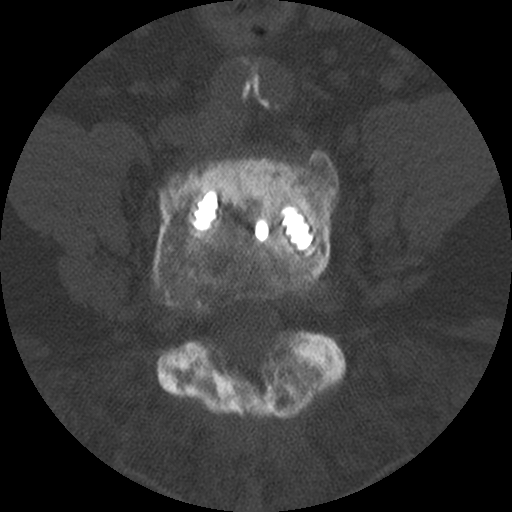
[im 66/99  bone]
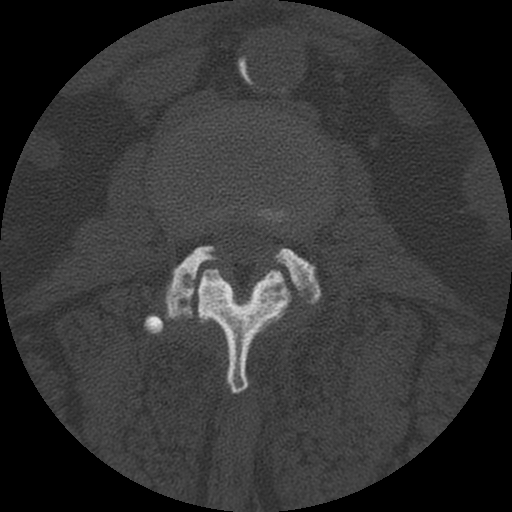

[Series 5: l-spine detail · axial · 0.27mm/px · z∈[-22,+60]mm · 2 of 99 slices shown]
[im 33/99  bone]
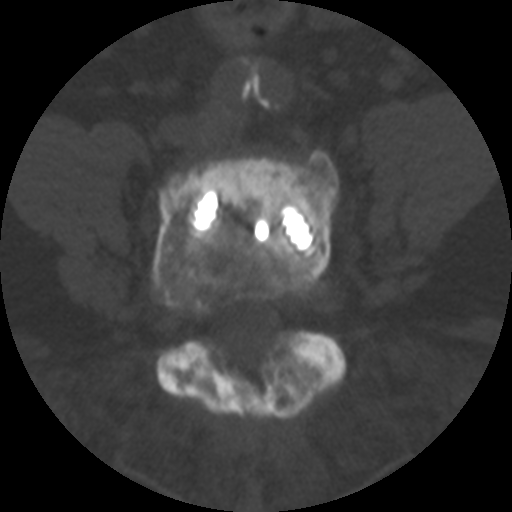
[im 66/99  bone]
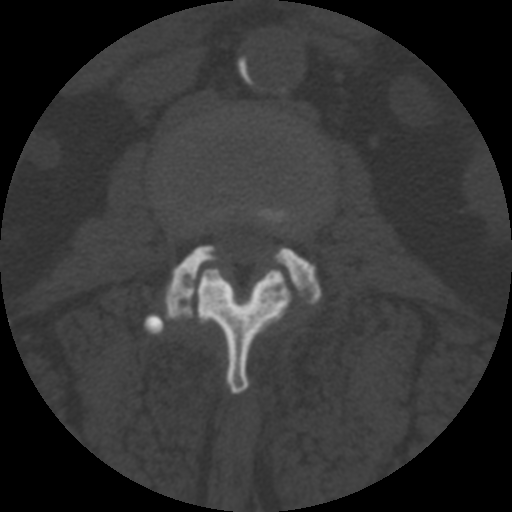

[Series 200: coronal · coronal · 0.50mm/px · 3 of 60 slices shown]
[im 12/60  bone]
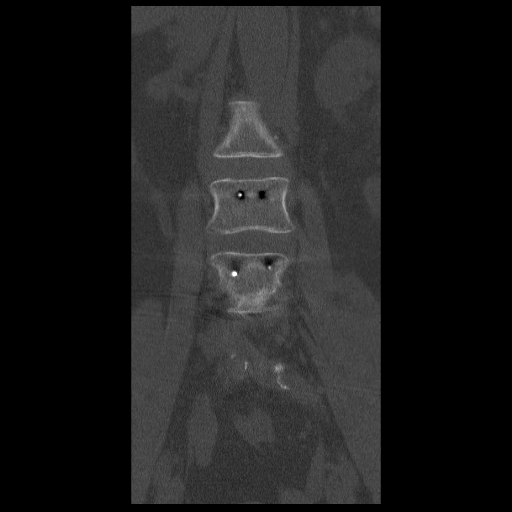
[im 24/60  bone]
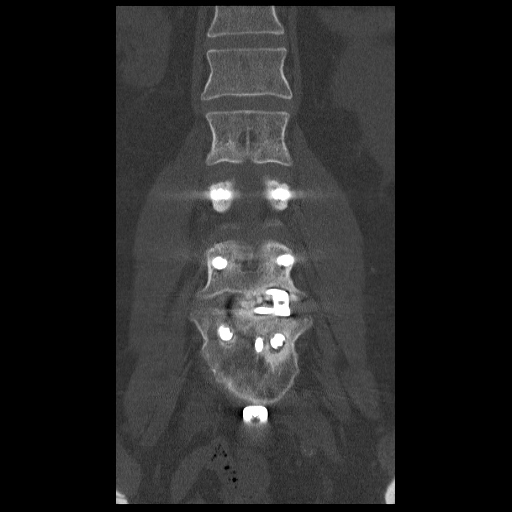
[im 36/60  bone]
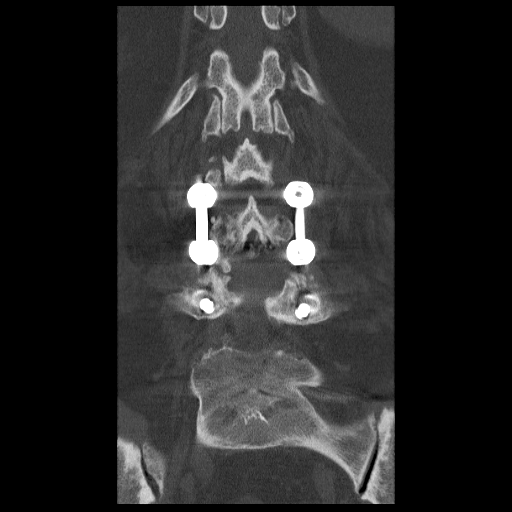

[Series 201: sagittal · sagittal · 0.50mm/px · 5 of 53 slices shown, 6 images]
[im 18/53  bone]
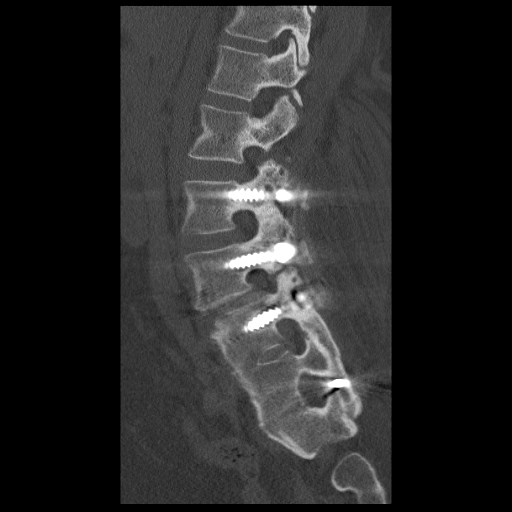
[im 22/53  bone]
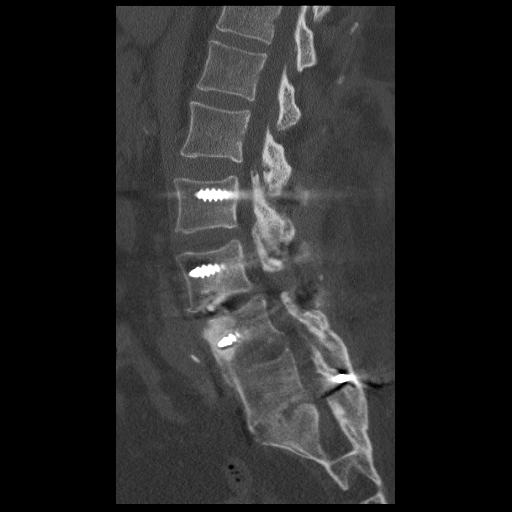
[im 27/53  soft-tissue]
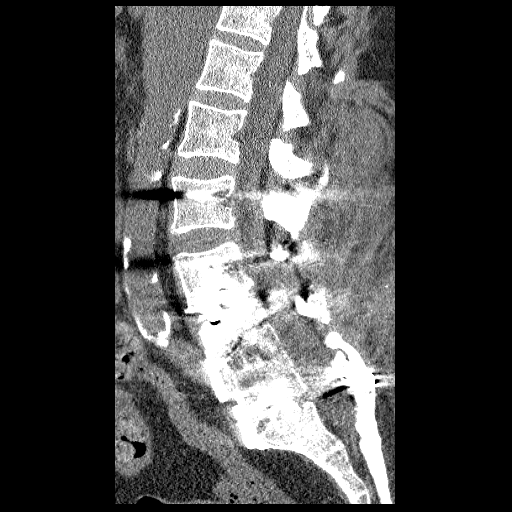
[im 27/53  bone]
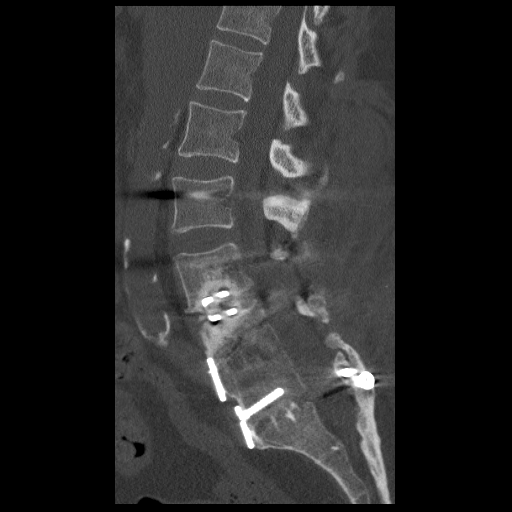
[im 31/53  bone]
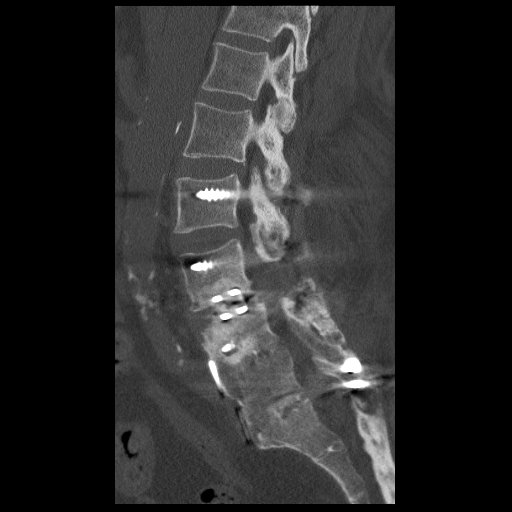
[im 35/53  bone]
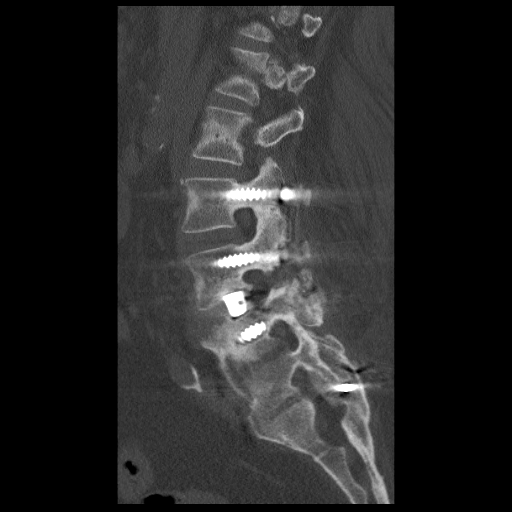

[12 of 33 positions shown; findings below may reference images not displayed]

FINDINGS: The patient is status post L2-S1 fusion. Posterior pedicle screws
and stabilization bars are in place from L2 mid L4. Mild lucency
about the L4 pedicle screws, more notable on the left, is unchanged.
There is solid bridging bone across the L3-4 disc interspace. Mild
subsidence of the interbody spacer at L3-4 into the inferior
endplate of L3 does not appear changed. Solid posterior osseous
fusion is seen at L2-3. There is no interbody spacer at L2-3.
Anterior plate and screws are seen at L4 and L5 with posterior
screws across the L5-S1 facets. All hardware is well positioned.
There is solid bridging bone across both the anterior and posterior
elements from L4-S1. Vertebral body height and alignment are
maintained. Imaged intra-abdominal contents are unremarkable.

T12-L1: Negative.

L1-2:  Mild facet degenerative disease.  Otherwise negative.

L2-3: Status post fusion. There is some ligamentum flavum thickening
and a shallow disc bulge causing mild central canal narrowing.
Foramina are open.

L3-4: Status post laminectomy and fusion. The central canal and
foramina are widely patent.

L4-5: Status post discectomy and fusion. The central canal and
foramina are widely patent.

L5-S1: Status post laminectomy and fusion. The central canal and
foramina are widely patent.
IMPRESSION: Mild lucency about the L3 pedicle screws, more notable on the left,
is unchanged. Solid bridging bone is seen across the L3-4 disc
interspace. There has been no progression of mild subsidence of the
interbody spacer at L3-4 into the inferior endplate of L3.

Solid posterior fusion L2-3. Central canal and foramina are widely
patent.

Solid L4-S1 fusion. Central canal and foramina are widely patent.

## 2013-12-24 DIAGNOSIS — M2550 Pain in unspecified joint: Secondary | ICD-10-CM | POA: Diagnosis not present

## 2013-12-24 DIAGNOSIS — M25519 Pain in unspecified shoulder: Secondary | ICD-10-CM | POA: Diagnosis not present

## 2013-12-28 IMAGING — RF DG LUMBAR SPINE 2-3V
1 series · 2 of 2 positions shown · non-contrast
Comparison: CT scan dated 07/17/2013

CLINICAL DATA: Pseudoarthrosis at L3-4.

EXAM:
LUMBAR SPINE - 2-3 VIEW

[Series 1: run · 2 of 2 slices shown]
[im 1/2]
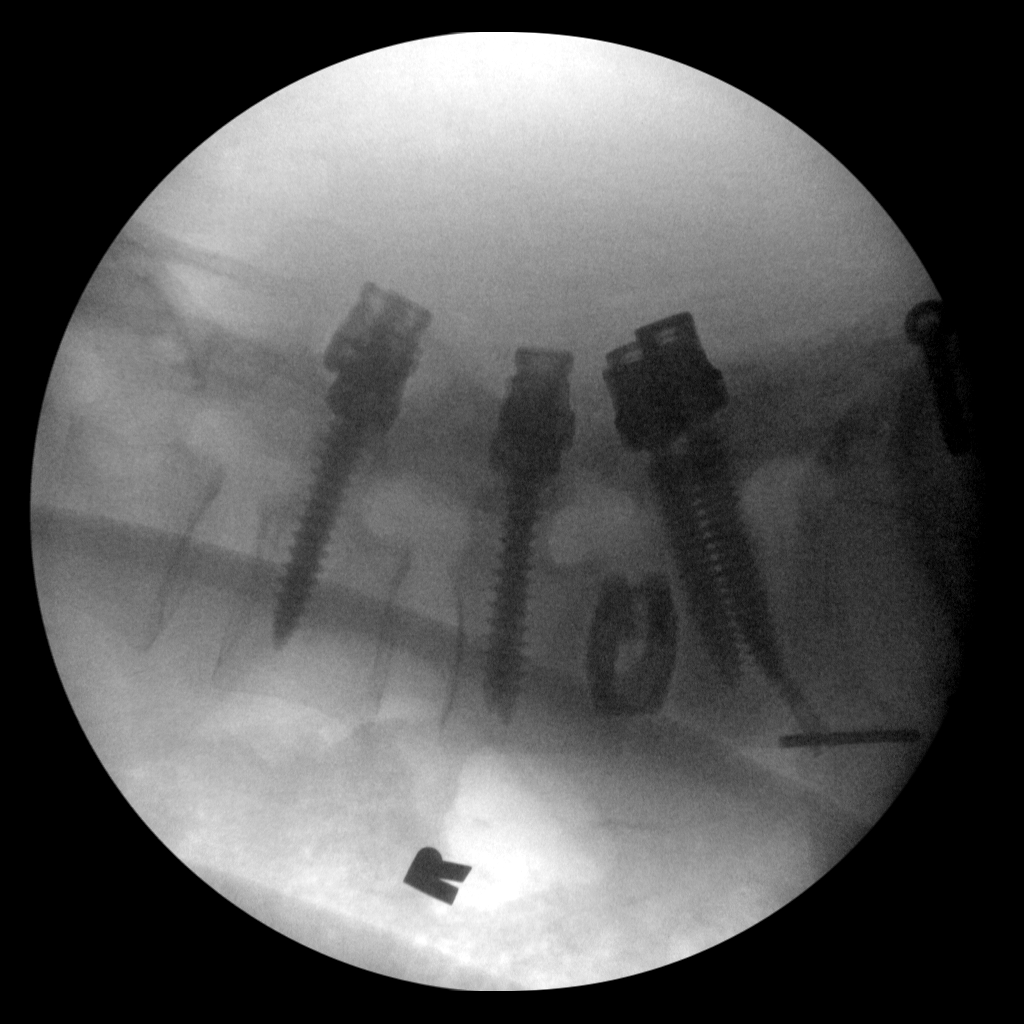
[im 2/2]
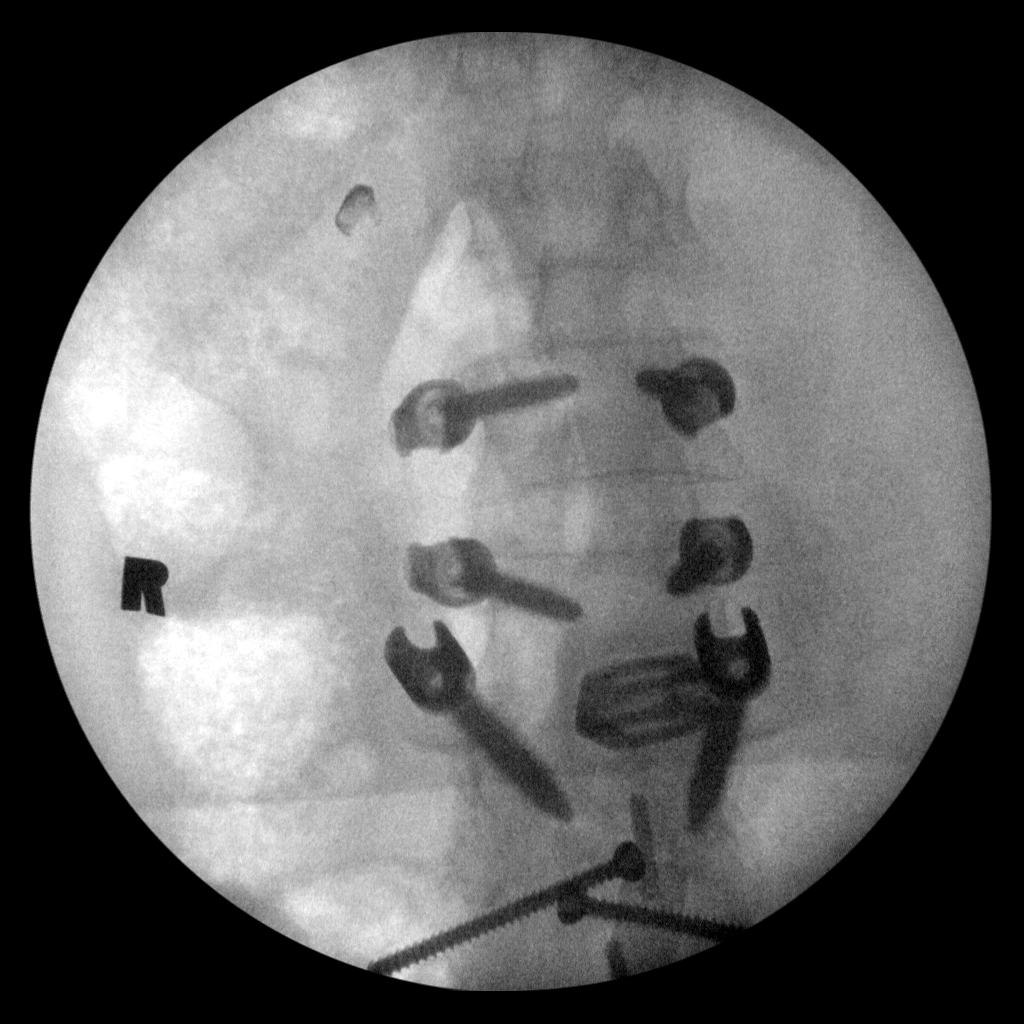

[2 of 2 positions shown; findings below may reference images not displayed]

FINDINGS: AP and lateral C-arm images demonstrate new pedicle screws at L4.
Posterior rods have been removed at the time of these images.
IMPRESSION: New pedicle screws at L4.

## 2013-12-30 DIAGNOSIS — M2550 Pain in unspecified joint: Secondary | ICD-10-CM | POA: Diagnosis not present

## 2013-12-30 DIAGNOSIS — I10 Essential (primary) hypertension: Secondary | ICD-10-CM | POA: Diagnosis not present

## 2013-12-30 DIAGNOSIS — F329 Major depressive disorder, single episode, unspecified: Secondary | ICD-10-CM | POA: Diagnosis not present

## 2013-12-30 DIAGNOSIS — Z23 Encounter for immunization: Secondary | ICD-10-CM | POA: Diagnosis not present

## 2013-12-30 DIAGNOSIS — K219 Gastro-esophageal reflux disease without esophagitis: Secondary | ICD-10-CM | POA: Diagnosis not present

## 2013-12-30 DIAGNOSIS — R1310 Dysphagia, unspecified: Secondary | ICD-10-CM | POA: Diagnosis not present

## 2013-12-30 DIAGNOSIS — G609 Hereditary and idiopathic neuropathy, unspecified: Secondary | ICD-10-CM | POA: Diagnosis not present

## 2013-12-30 DIAGNOSIS — E7800 Pure hypercholesterolemia, unspecified: Secondary | ICD-10-CM | POA: Diagnosis not present

## 2013-12-30 DIAGNOSIS — Z0000 Encounter for general adult medical examination without abnormal findings: Secondary | ICD-10-CM | POA: Diagnosis not present

## 2014-01-04 DIAGNOSIS — M7750 Other enthesopathy of unspecified foot: Secondary | ICD-10-CM | POA: Diagnosis not present

## 2014-01-04 DIAGNOSIS — L608 Other nail disorders: Secondary | ICD-10-CM | POA: Diagnosis not present

## 2014-01-04 DIAGNOSIS — G9009 Other idiopathic peripheral autonomic neuropathy: Secondary | ICD-10-CM | POA: Diagnosis not present

## 2014-01-12 DIAGNOSIS — M67919 Unspecified disorder of synovium and tendon, unspecified shoulder: Secondary | ICD-10-CM | POA: Diagnosis not present

## 2014-01-12 DIAGNOSIS — M719 Bursopathy, unspecified: Secondary | ICD-10-CM | POA: Diagnosis not present

## 2014-01-13 DIAGNOSIS — IMO0001 Reserved for inherently not codable concepts without codable children: Secondary | ICD-10-CM | POA: Diagnosis not present

## 2014-01-13 DIAGNOSIS — M545 Low back pain, unspecified: Secondary | ICD-10-CM | POA: Diagnosis not present

## 2014-01-13 DIAGNOSIS — M2550 Pain in unspecified joint: Secondary | ICD-10-CM | POA: Diagnosis not present

## 2014-01-13 DIAGNOSIS — R894 Abnormal immunological findings in specimens from other organs, systems and tissues: Secondary | ICD-10-CM | POA: Diagnosis not present

## 2014-01-15 DIAGNOSIS — H2510 Age-related nuclear cataract, unspecified eye: Secondary | ICD-10-CM | POA: Diagnosis not present

## 2014-01-15 DIAGNOSIS — H35039 Hypertensive retinopathy, unspecified eye: Secondary | ICD-10-CM | POA: Diagnosis not present

## 2014-01-15 DIAGNOSIS — H524 Presbyopia: Secondary | ICD-10-CM | POA: Diagnosis not present

## 2014-03-18 DIAGNOSIS — I10 Essential (primary) hypertension: Secondary | ICD-10-CM | POA: Diagnosis not present

## 2014-03-18 DIAGNOSIS — Z6841 Body Mass Index (BMI) 40.0 and over, adult: Secondary | ICD-10-CM | POA: Diagnosis not present

## 2014-03-18 DIAGNOSIS — IMO0002 Reserved for concepts with insufficient information to code with codable children: Secondary | ICD-10-CM | POA: Diagnosis not present

## 2014-03-18 DIAGNOSIS — M961 Postlaminectomy syndrome, not elsewhere classified: Secondary | ICD-10-CM | POA: Diagnosis not present

## 2014-03-18 DIAGNOSIS — S32009K Unspecified fracture of unspecified lumbar vertebra, subsequent encounter for fracture with nonunion: Secondary | ICD-10-CM | POA: Diagnosis present

## 2014-03-22 ENCOUNTER — Other Ambulatory Visit: Payer: Self-pay | Admitting: Neurosurgery

## 2014-03-22 DIAGNOSIS — IMO0002 Reserved for concepts with insufficient information to code with codable children: Secondary | ICD-10-CM

## 2014-03-24 ENCOUNTER — Ambulatory Visit
Admission: RE | Admit: 2014-03-24 | Discharge: 2014-03-24 | Disposition: A | Discharge status: Home / Self Care | Payer: Medicare Other | Source: Ambulatory Visit | Attending: Neurosurgery | Admitting: Neurosurgery

## 2014-03-24 DIAGNOSIS — M47817 Spondylosis without myelopathy or radiculopathy, lumbosacral region: Secondary | ICD-10-CM | POA: Diagnosis not present

## 2014-03-24 DIAGNOSIS — IMO0002 Reserved for concepts with insufficient information to code with codable children: Secondary | ICD-10-CM

## 2014-03-25 DIAGNOSIS — M25519 Pain in unspecified shoulder: Secondary | ICD-10-CM | POA: Diagnosis not present

## 2014-03-30 DIAGNOSIS — IMO0002 Reserved for concepts with insufficient information to code with codable children: Secondary | ICD-10-CM | POA: Diagnosis not present

## 2014-03-30 DIAGNOSIS — M7060 Trochanteric bursitis, unspecified hip: Secondary | ICD-10-CM | POA: Insufficient documentation

## 2014-03-30 DIAGNOSIS — M76899 Other specified enthesopathies of unspecified lower limb, excluding foot: Secondary | ICD-10-CM | POA: Diagnosis not present

## 2014-03-30 DIAGNOSIS — M961 Postlaminectomy syndrome, not elsewhere classified: Secondary | ICD-10-CM | POA: Diagnosis not present

## 2014-04-06 DIAGNOSIS — M249 Joint derangement, unspecified: Secondary | ICD-10-CM | POA: Diagnosis not present

## 2014-04-06 DIAGNOSIS — M25519 Pain in unspecified shoulder: Secondary | ICD-10-CM | POA: Diagnosis not present

## 2014-04-08 DIAGNOSIS — M75120 Complete rotator cuff tear or rupture of unspecified shoulder, not specified as traumatic: Secondary | ICD-10-CM | POA: Diagnosis not present

## 2014-04-12 ENCOUNTER — Other Ambulatory Visit: Payer: Self-pay | Admitting: Surgical

## 2014-04-20 ENCOUNTER — Encounter (HOSPITAL_COMMUNITY): Payer: Self-pay | Admitting: Pharmacy Technician

## 2014-04-20 NOTE — Patient Instructions (Signed)
Klaudia Beirne Lyles-Williams  04/20/2014   Your procedure is scheduled on:  05/05/2014  1130am-100pm  Report to [facility].  Follow the Signs to [place] at  0930      am  Call this number if you have problems the morning of surgery: [phone]   Remember:   Do not eat food or drink liquids after midnight.   Take these medicines the morning of surgery with A SIP OF WATER:    Do not wear jewelry, make-up or nail polish.  Do not wear lotions, powders, or perfumes. , deodorant    Do not shave 48 hours prior to surgery.  Do not bring valuables to the hospital.  Contacts, dentures or bridgework may not be worn into surgery.  Leave suitcase in the car. After surgery it may be brought to your room.  For patients admitted to the hospital, checkout time is 11:00 AM the day of  discharge.          Please read over the following fact sheets that you were given: [facility] - Preparing for Surgery Before surgery, you can play an important role.  Because skin is not sterile, your skin needs to be as free of germs as possible.  You can reduce the number of germs on your skin by washing with CHG (chlorahexidine gluconate) soap before surgery.  CHG is an antiseptic cleaner which kills germs and bonds with the skin to continue killing germs even after washing. Please DO NOT use if you have an allergy to CHG or antibacterial soaps.  If your skin becomes reddened/irritated stop using the CHG and inform your nurse when you arrive at Short Stay. Do not shave (including legs and underarms) for at least 48 hours prior to the first CHG shower.  You may shave your face/neck. Please follow these instructions carefully:  1.  Shower with CHG Soap the night before surgery and the  morning of Surgery.  2.  If you choose to wash your hair, wash your hair first as usual with your  normal  shampoo.  3.  After you shampoo, rinse your hair and body thoroughly to remove the  shampoo.                            4.  Use CHG as you would any other liquid soap.  You can apply chg directly  to the skin and wash                       Gently with a scrungie or clean washcloth.  5.  Apply the CHG Soap to your body ONLY FROM THE NECK DOWN.   Do not use on face/ open                           Wound or open sores. Avoid contact with eyes, ears mouth and genitals (private parts).                       Wash face,  Genitals (private parts) with your normal soap.             6.  Wash thoroughly, paying special attention to the area where your surgery  will be performed.  7.  Thoroughly rinse your body with warm water from the neck down.  8.  DO NOT shower/wash with your  normal soap after using and rinsing off  the CHG Soap.                9.  Pat yourself dry with a clean towel.            10.  Wear clean pajamas.            11.  Place clean sheets on your bed the night of your first shower and do not  sleep with pets. Day of Surgery : Do not apply any lotions/deodorants the morning of surgery.  Please wear clean clothes to the hospital/surgery center.  FAILURE TO FOLLOW THESE INSTRUCTIONS MAY RESULT IN THE CANCELLATION OF YOUR SURGERY PATIENT SIGNATURE_________________________________  NURSE SIGNATURE__________________________________  ________________________________________________________________________  coughing and deep breathing exercises, leg exercises

## 2014-04-21 ENCOUNTER — Ambulatory Visit (HOSPITAL_COMMUNITY)
Admission: RE | Admit: 2014-04-21 | Discharge: 2014-04-21 | Disposition: A | Discharge status: Home / Self Care | Payer: Medicare Other | Source: Ambulatory Visit | Attending: Surgical | Admitting: Surgical

## 2014-04-21 ENCOUNTER — Encounter (HOSPITAL_COMMUNITY)
Admission: RE | Admit: 2014-04-21 | Discharge: 2014-04-21 | Disposition: A | Discharge status: Home / Self Care | Payer: Medicare Other | Source: Ambulatory Visit | Attending: Orthopedic Surgery | Admitting: Orthopedic Surgery

## 2014-04-21 ENCOUNTER — Encounter (INDEPENDENT_AMBULATORY_CARE_PROVIDER_SITE_OTHER): Payer: Self-pay

## 2014-04-21 ENCOUNTER — Encounter (HOSPITAL_COMMUNITY): Payer: Self-pay

## 2014-04-21 DIAGNOSIS — Z01810 Encounter for preprocedural cardiovascular examination: Secondary | ICD-10-CM | POA: Diagnosis not present

## 2014-04-21 DIAGNOSIS — I10 Essential (primary) hypertension: Secondary | ICD-10-CM | POA: Insufficient documentation

## 2014-04-21 DIAGNOSIS — Z01812 Encounter for preprocedural laboratory examination: Secondary | ICD-10-CM | POA: Insufficient documentation

## 2014-04-21 DIAGNOSIS — Z01818 Encounter for other preprocedural examination: Secondary | ICD-10-CM | POA: Insufficient documentation

## 2014-04-21 LAB — COMPREHENSIVE METABOLIC PANEL
ALT: 20 U/L (ref 0–35)
AST: 22 U/L (ref 0–37)
Albumin: 3.9 g/dL (ref 3.5–5.2)
Alkaline Phosphatase: 106 U/L (ref 39–117)
Anion gap: 12 (ref 5–15)
BUN: 18 mg/dL (ref 6–23)
CO2: 28 mEq/L (ref 19–32)
Calcium: 9.5 mg/dL (ref 8.4–10.5)
Chloride: 98 mEq/L (ref 96–112)
Creatinine, Ser: 0.87 mg/dL (ref 0.50–1.10)
GFR calc Af Amer: 78 mL/min — ABNORMAL LOW (ref >90)
GFR calc non Af Amer: 67 mL/min — ABNORMAL LOW (ref >90)
Glucose, Bld: 106 mg/dL — ABNORMAL HIGH (ref 70–99)
Potassium: 4.3 mEq/L (ref 3.7–5.3)
Sodium: 138 mEq/L (ref 137–147)
Total Bilirubin: 0.4 mg/dL (ref 0.3–1.2)
Total Protein: 8 g/dL (ref 6.0–8.3)

## 2014-04-21 LAB — CBC
HCT: 41.7 % (ref 36.0–46.0)
Hemoglobin: 13.8 g/dL (ref 12.0–15.0)
MCH: 30.3 pg (ref 26.0–34.0)
MCHC: 33.1 g/dL (ref 30.0–36.0)
MCV: 91.6 fL (ref 78.0–100.0)
Platelets: 306 10*3/uL (ref 150–400)
RBC: 4.55 MIL/uL (ref 3.87–5.11)
RDW: 14.9 % (ref 11.5–15.5)
WBC: 5.2 10*3/uL (ref 4.0–10.5)

## 2014-04-21 LAB — URINALYSIS, ROUTINE W REFLEX MICROSCOPIC
Bilirubin Urine: NEGATIVE
Glucose, UA: NEGATIVE mg/dL
Hgb urine dipstick: NEGATIVE
Ketones, ur: NEGATIVE mg/dL
Leukocytes, UA: NEGATIVE
Nitrite: NEGATIVE
Protein, ur: NEGATIVE mg/dL
Specific Gravity, Urine: 1.019 (ref 1.005–1.030)
Urobilinogen, UA: 1 mg/dL (ref 0.0–1.0)
pH: 7 (ref 5.0–8.0)

## 2014-04-21 LAB — SURGICAL PCR SCREEN
MRSA, PCR: NEGATIVE
Staphylococcus aureus: POSITIVE — AB

## 2014-04-21 LAB — PROTIME-INR
INR: 0.95 (ref 0.00–1.49)
Prothrombin Time: 12.7 seconds (ref 11.6–15.2)

## 2014-04-21 LAB — APTT: aPTT: 34 seconds (ref 24–37)

## 2014-04-21 NOTE — Progress Notes (Signed)
Patient has hx of MRSA per patient.  PCR screen done with results positive for staph.  Prescription called in for Mupirocin to [facility] on [address] in [place].  Dr Gladstone Lighter was faxed results via EPIC with note attached.

## 2014-04-28 NOTE — H&P (Signed)
Amy [name] is an 68 y.o. female.   Chief Complaint: left shoulder pain HPI: The patient is a 68 year old female who presented with the chief complaint of left shoulder pain. She has had left shoulder pain for about 6 months with no known injury. She denies radiation of pain from the neck or down the arm as well as numbness and tingling. MRI showed complete tear of the left rotator cuff.   Past Medical History  Diagnosis Date  . Anxiety   . Dysphonia     intermittent "voice changes"  . Chronic back pain     steroid injection last 01-20-13 next 02-19-13  . Arthritis     spinal fusion-lumbar; reains with chronic pain, steroid injections at present  . History of blood clots     tx. '89-Heparin and Coumadin-affected left arm  . Hypertension     takes Hyzaar and Plendil daily  . History of blood clots     right arm-about 48yrs ago  . Sleep apnea     Dx.unable to tolerate cpap-no machine now  . Seasonal allergies     takes Benadryl daily prn  . Asthma     pt was told she has "sleeping asthma"  . Headache(784.0)     occasionally  . Fibromyalgia   . Weakness     both hands and feet  . Chronic back pain   . Constipation   . Urinary frequency     takes Rapaflo daily prn  . History of blood transfusion   . Glaucoma   . Depression     takes Paxil daily  . Insomnia     occasionally and no meds required  . History of MRSA infection 2011  . GERD (gastroesophageal reflux disease)     prone to throat spasms related to reflux; takes Nexium daily and Robinul bid  . Dysrhythmia     fast heart beat   . Shortness of breath     with exertion     Past Surgical History  Procedure Laterality Date  . Abdominal hysterectomy    . Spinal fusion      Titanium hardware  . Nose surgery    . Bone graft hip iliac crest Right     for lumbar spinal surgery/fusion  . Back surgery  02-09-13    2012 - back surgery  . Mass excision Left 02/20/2013    Procedure: EXCISION LEFT BUTTOCK   MASS;  Surgeon: Adin Hector, MD;  Location: [facility];  Service: General;  Laterality: Left;  . Lipoma excision  01/2013  . Esophagogastroduodenoscopy N/A 07/15/2013    Procedure: ESOPHAGOGASTRODUODENOSCOPY (EGD);  Surgeon: Winfield Cunas., MD;  Location: [facility];  Service: Endoscopy;  Laterality: N/A;  need xray  . Abdominal adhesions removed    . Colonoscopy    . Ingrown toenail surgery     . Bunionectomy      Family History  Problem Relation Age of Onset  . Hypertension Mother   . Cancer Mother     cervix  . Kidney disease Mother   . Hypertension Sister   . Cancer Sister     polyps  . Kidney disease Brother   . Cancer Daughter     leukemia  . Hypertension Brother    Social History:  reports that she has quit smoking. She has never used smokeless tobacco. She reports that she drinks alcohol. She reports that she does not use illicit drugs.  Allergies:  Allergies  Allergen Reactions  . Penicillins Rash  . Latex Other (See Comments)    Sores, blisters    Current outpatient prescriptions: acetaminophen (TYLENOL) 500 MG tablet, Take 1,000 mg by mouth every 8 (eight) hours as needed for pain., Disp: , Rfl: ;   azelaic acid (AZELEX) 20 % cream, Apply 1 application topically 2 (two) times daily. After skin is thoroughly washed and patted dry, gently but thoroughly massage a thin film of azelaic acid cream into the affected area twice daily, in the morning and evening., Disp: , Rfl:  B Complex Vitamins (VITAMIN B COMPLEX PO), Take 1 tablet by mouth daily., Disp: , Rfl: ;   calcium citrate-vitamin D (CITRACAL+D) 315-200 MG-UNIT per tablet, Take 1 tablet by mouth daily., Disp: , Rfl: ;   celecoxib (CELEBREX) 200 MG capsule, Take 200 mg by mouth at bedtime., Disp: , Rfl: ;   diphenhydrAMINE (BENADRYL) 25 mg capsule, Take 25 mg by mouth 2 (two) times daily as needed for allergies., Disp: , Rfl:  esomeprazole (NEXIUM) 40 MG capsule, Take 40 mg by mouth 2 (two) times daily. ,  Disp: , Rfl: ;   felodipine (PLENDIL) 10 MG 24 hr tablet, Take 10 mg by mouth every morning. , Disp: , Rfl: ;   glycopyrrolate (ROBINUL) 2 MG tablet, Take 2 mg by mouth 2 (two) times daily. , Disp: , Rfl: ;   losartan-hydrochlorothiazide (HYZAAR) 100-25 MG per tablet, Take 1 tablet by mouth every morning. , Disp: , Rfl:  Multiple Vitamin (MULTIVITAMIN WITH MINERALS) TABS, Take 1 tablet by mouth daily., Disp: , Rfl: ;   PARoxetine (PAXIL) 30 MG tablet, Take 30 mg by mouth at bedtime. , Disp: , Rfl: ;   POTASSIUM PO, Take 1 tablet by mouth daily., Disp: , Rfl: ;   silodosin (RAPAFLO) 8 MG CAPS capsule, Take 8 mg by mouth daily as needed (for bladder). , Disp: , Rfl:  traMADol (ULTRAM) 50 MG tablet, Take 50 mg by mouth every 6 (six) hours as needed (Pain)., Disp: , Rfl: ;   vitamin E (VITAMIN E) 400 UNIT capsule, Take 400 Units by mouth daily., Disp: , Rfl:    Review of Systems  Constitutional: Negative for fever, chills, weight loss, malaise/fatigue and diaphoresis.  HENT: Negative.   Eyes: Positive for photophobia. Negative for blurred vision, double vision, pain, discharge and redness.  Respiratory: Negative.   Cardiovascular: Negative.   Gastrointestinal: Negative.   Genitourinary: Negative.   Musculoskeletal: Positive for falls and joint pain. Negative for back pain, myalgias and neck pain.       Left shoulder pain  Skin: Negative.   Neurological: Positive for weakness. Negative for dizziness, tingling, tremors, sensory change, speech change, focal weakness, seizures and loss of consciousness.  Endo/Heme/Allergies: Positive for polydipsia. Negative for environmental allergies. Does not bruise/bleed easily.  Psychiatric/Behavioral: Negative for depression, suicidal ideas, hallucinations, memory loss and substance abuse. The patient is nervous/anxious. The patient does not have insomnia.    Vitals Weight: 262 lb Height: 65.5 in Body Surface Area: 2.34 m Body Mass Index: 42.94  kg/m Pulse: 105 (Regular) BP: 168/94 (Sitting, Left Arm, Standard)  Physical Exam  Constitutional: She is oriented to person, place, and time. She appears well-developed and well-nourished. No distress.  HENT:  Head: Normocephalic and atraumatic.  Right Ear: External ear normal.  Left Ear: External ear normal.  Nose: Nose normal.  Mouth/Throat: Oropharynx is clear and moist.  Eyes: Conjunctivae and EOM are normal.  Neck: Normal range of motion. Neck supple.  Cardiovascular: Regular rhythm, normal heart sounds and intact distal pulses.  Tachycardia present.   No murmur heard. Respiratory: Effort normal and breath sounds normal. No respiratory distress. She has no wheezes.  GI: Soft. Bowel sounds are normal. She exhibits no distension. There is no tenderness.  Musculoskeletal:       Right shoulder: Normal.       Left shoulder: She exhibits decreased range of motion, tenderness, pain and decreased strength.       Right elbow: Normal.      Left elbow: Normal.       Right wrist: Normal.       Left wrist: Normal.  Neurological: She is alert and oriented to person, place, and time. She has normal reflexes. No sensory deficit.  Skin: No rash noted. She is not diaphoretic. No erythema.  Psychiatric: She has a normal mood and affect. Her behavior is normal.     Assessment/Plan Left shoulder, complete rotator cuff tear She has a complete tear of the left rotator cuff. She needs a left shoulder open rotator cuff repair with graft and anchors. This will be an outpatient procedure. We may or may not need to use a graft material that is made from calf skin. This is approved by the FDA and I have not had a rejection of that graft yet. Also, we may need to use anchors. These are polyethylene anchors that stay in the bone and we use those anchors to suture the tendon down in certain cases where the tendon is completely pulled off the bone. There is always a chance of a secondary infection  obviously with any surgery but we do use antibiotics preop.   Wynetta Seith LAUREN 04/28/2014, 11:33 AM

## 2014-05-05 ENCOUNTER — Encounter (HOSPITAL_COMMUNITY)
Admission: RE | Disposition: A | Discharge status: Home / Self Care | Payer: Self-pay | Source: Ambulatory Visit | Attending: Orthopedic Surgery

## 2014-05-05 ENCOUNTER — Ambulatory Visit (HOSPITAL_COMMUNITY): Payer: Medicare Other | Admitting: Certified Registered"

## 2014-05-05 ENCOUNTER — Encounter (HOSPITAL_COMMUNITY): Payer: Self-pay | Admitting: *Deleted

## 2014-05-05 ENCOUNTER — Ambulatory Visit (HOSPITAL_COMMUNITY)
Admission: RE | Admit: 2014-05-05 | Discharge: 2014-05-06 | Disposition: A | Discharge status: Home / Self Care | Payer: Medicare Other | Source: Ambulatory Visit | Attending: Orthopedic Surgery | Admitting: Orthopedic Surgery

## 2014-05-05 ENCOUNTER — Encounter (HOSPITAL_COMMUNITY): Payer: Medicare Other | Admitting: Certified Registered"

## 2014-05-05 DIAGNOSIS — F329 Major depressive disorder, single episode, unspecified: Secondary | ICD-10-CM | POA: Insufficient documentation

## 2014-05-05 DIAGNOSIS — R29898 Other symptoms and signs involving the musculoskeletal system: Secondary | ICD-10-CM | POA: Diagnosis not present

## 2014-05-05 DIAGNOSIS — G4730 Sleep apnea, unspecified: Secondary | ICD-10-CM | POA: Insufficient documentation

## 2014-05-05 DIAGNOSIS — Z90710 Acquired absence of both cervix and uterus: Secondary | ICD-10-CM | POA: Insufficient documentation

## 2014-05-05 DIAGNOSIS — G4700 Insomnia, unspecified: Secondary | ICD-10-CM | POA: Insufficient documentation

## 2014-05-05 DIAGNOSIS — Z86718 Personal history of other venous thrombosis and embolism: Secondary | ICD-10-CM | POA: Diagnosis not present

## 2014-05-05 DIAGNOSIS — R350 Frequency of micturition: Secondary | ICD-10-CM | POA: Diagnosis not present

## 2014-05-05 DIAGNOSIS — F411 Generalized anxiety disorder: Secondary | ICD-10-CM | POA: Insufficient documentation

## 2014-05-05 DIAGNOSIS — M549 Dorsalgia, unspecified: Secondary | ICD-10-CM | POA: Insufficient documentation

## 2014-05-05 DIAGNOSIS — G8929 Other chronic pain: Secondary | ICD-10-CM | POA: Insufficient documentation

## 2014-05-05 DIAGNOSIS — J45909 Unspecified asthma, uncomplicated: Secondary | ICD-10-CM | POA: Diagnosis not present

## 2014-05-05 DIAGNOSIS — J309 Allergic rhinitis, unspecified: Secondary | ICD-10-CM | POA: Insufficient documentation

## 2014-05-05 DIAGNOSIS — S43429A Sprain of unspecified rotator cuff capsule, initial encounter: Secondary | ICD-10-CM | POA: Diagnosis not present

## 2014-05-05 DIAGNOSIS — M7580 Other shoulder lesions, unspecified shoulder: Secondary | ICD-10-CM

## 2014-05-05 DIAGNOSIS — R490 Dysphonia: Secondary | ICD-10-CM | POA: Diagnosis not present

## 2014-05-05 DIAGNOSIS — IMO0001 Reserved for inherently not codable concepts without codable children: Secondary | ICD-10-CM | POA: Insufficient documentation

## 2014-05-05 DIAGNOSIS — R51 Headache: Secondary | ICD-10-CM | POA: Insufficient documentation

## 2014-05-05 DIAGNOSIS — X58XXXA Exposure to other specified factors, initial encounter: Secondary | ICD-10-CM | POA: Diagnosis not present

## 2014-05-05 DIAGNOSIS — H409 Unspecified glaucoma: Secondary | ICD-10-CM | POA: Diagnosis not present

## 2014-05-05 DIAGNOSIS — M129 Arthropathy, unspecified: Secondary | ICD-10-CM | POA: Insufficient documentation

## 2014-05-05 DIAGNOSIS — M25819 Other specified joint disorders, unspecified shoulder: Secondary | ICD-10-CM | POA: Insufficient documentation

## 2014-05-05 DIAGNOSIS — Z79899 Other long term (current) drug therapy: Secondary | ICD-10-CM | POA: Diagnosis not present

## 2014-05-05 DIAGNOSIS — S43499A Other sprain of unspecified shoulder joint, initial encounter: Secondary | ICD-10-CM | POA: Diagnosis not present

## 2014-05-05 DIAGNOSIS — K219 Gastro-esophageal reflux disease without esophagitis: Secondary | ICD-10-CM | POA: Diagnosis not present

## 2014-05-05 DIAGNOSIS — Z91040 Latex allergy status: Secondary | ICD-10-CM | POA: Diagnosis not present

## 2014-05-05 DIAGNOSIS — M75122 Complete rotator cuff tear or rupture of left shoulder, not specified as traumatic: Secondary | ICD-10-CM | POA: Diagnosis present

## 2014-05-05 DIAGNOSIS — Z880 Allergy status to penicillin: Secondary | ICD-10-CM | POA: Insufficient documentation

## 2014-05-05 DIAGNOSIS — R0602 Shortness of breath: Secondary | ICD-10-CM | POA: Insufficient documentation

## 2014-05-05 DIAGNOSIS — M75120 Complete rotator cuff tear or rupture of unspecified shoulder, not specified as traumatic: Secondary | ICD-10-CM | POA: Diagnosis not present

## 2014-05-05 DIAGNOSIS — I10 Essential (primary) hypertension: Secondary | ICD-10-CM | POA: Diagnosis not present

## 2014-05-05 DIAGNOSIS — F3289 Other specified depressive episodes: Secondary | ICD-10-CM | POA: Diagnosis not present

## 2014-05-05 HISTORY — PX: SHOULDER OPEN ROTATOR CUFF REPAIR: SHX2407

## 2014-05-05 SURGERY — REPAIR, ROTATOR CUFF, OPEN
Anesthesia: General | Site: Shoulder | Laterality: Left

## 2014-05-05 MED ORDER — PAROXETINE HCL 30 MG PO TABS
30.0000 mg | ORAL_TABLET | Freq: Every day | ORAL | Status: DC
  Administered 2014-05-05: 30 mg via ORAL
  Filled 2014-05-05 (×2): qty 1

## 2014-05-05 MED ORDER — ONDANSETRON HCL 4 MG/2ML IJ SOLN
INTRAMUSCULAR | Status: DC | PRN
  Administered 2014-05-05: 4 mg via INTRAVENOUS

## 2014-05-05 MED ORDER — ONDANSETRON HCL 4 MG/2ML IJ SOLN: 4.0000 mg | Freq: Four times a day (QID) | INTRAMUSCULAR | Status: DC | PRN

## 2014-05-05 MED ORDER — THROMBIN 5000 UNITS EX SOLR
CUTANEOUS | Status: DC | PRN
  Administered 2014-05-05: 5000 [IU] via TOPICAL

## 2014-05-05 MED ORDER — DOCUSATE SODIUM 100 MG PO CAPS
100.0000 mg | ORAL_CAPSULE | Freq: Two times a day (BID) | ORAL | Status: DC
  Administered 2014-05-05 – 2014-05-06 (×2): 100 mg via ORAL

## 2014-05-05 MED ORDER — METHOCARBAMOL 500 MG PO TABS
500.0000 mg | ORAL_TABLET | Freq: Four times a day (QID) | ORAL | Status: DC | PRN
  Administered 2014-05-05 – 2014-05-06 (×2): 500 mg via ORAL
  Filled 2014-05-05 (×2): qty 1

## 2014-05-05 MED ORDER — SODIUM CHLORIDE 0.9 % IR SOLN
Status: DC | PRN
  Administered 2014-05-05: 12:00:00

## 2014-05-05 MED ORDER — PHENYLEPHRINE 40 MCG/ML (10ML) SYRINGE FOR IV PUSH (FOR BLOOD PRESSURE SUPPORT)
PREFILLED_SYRINGE | INTRAVENOUS | Status: AC
  Filled 2014-05-05: qty 10

## 2014-05-05 MED ORDER — TAMSULOSIN HCL 0.4 MG PO CAPS
0.4000 mg | ORAL_CAPSULE | Freq: Every day | ORAL | Status: DC
  Administered 2014-05-05: 0.4 mg via ORAL
  Filled 2014-05-05 (×2): qty 1

## 2014-05-05 MED ORDER — POLYETHYLENE GLYCOL 3350 17 G PO PACK: 17.0000 g | PACK | Freq: Every day | ORAL | Status: DC | PRN

## 2014-05-05 MED ORDER — PROPOFOL 10 MG/ML IV BOLUS
INTRAVENOUS | Status: AC
  Filled 2014-05-05: qty 20

## 2014-05-05 MED ORDER — FELODIPINE ER 10 MG PO TB24
10.0000 mg | ORAL_TABLET | Freq: Every morning | ORAL | Status: DC
  Administered 2014-05-06: 10 mg via ORAL
  Filled 2014-05-05: qty 1

## 2014-05-05 MED ORDER — ONDANSETRON HCL 4 MG PO TABS
4.0000 mg | ORAL_TABLET | Freq: Four times a day (QID) | ORAL | Status: DC | PRN
Start: 2014-05-05 — End: 2014-05-06

## 2014-05-05 MED ORDER — FLEET ENEMA 7-19 GM/118ML RE ENEM: 1.0000 | ENEMA | Freq: Once | RECTAL | Status: AC | PRN

## 2014-05-05 MED ORDER — DEXTROSE 5 % IV SOLN
500.0000 mg | Freq: Four times a day (QID) | INTRAVENOUS | Status: DC | PRN
  Administered 2014-05-05: 500 mg via INTRAVENOUS
  Filled 2014-05-05: qty 5

## 2014-05-05 MED ORDER — HYDROCODONE-ACETAMINOPHEN 5-325 MG PO TABS
1.0000 | ORAL_TABLET | ORAL | Status: DC | PRN
  Administered 2014-05-05 – 2014-05-06 (×5): 2 via ORAL
  Filled 2014-05-05 (×5): qty 2

## 2014-05-05 MED ORDER — FENTANYL CITRATE 0.05 MG/ML IJ SOLN
INTRAMUSCULAR | Status: AC
  Filled 2014-05-05: qty 2

## 2014-05-05 MED ORDER — AZELAIC ACID 20 % EX CREA
1.0000 | TOPICAL_CREAM | Freq: Two times a day (BID) | CUTANEOUS | Status: DC
Start: 2014-05-05 — End: 2014-05-06

## 2014-05-05 MED ORDER — HYDROMORPHONE HCL PF 1 MG/ML IJ SOLN
0.2500 mg | INTRAMUSCULAR | Status: DC | PRN
  Administered 2014-05-05 (×2): 0.5 mg via INTRAVENOUS

## 2014-05-05 MED ORDER — HYDROCHLOROTHIAZIDE 25 MG PO TABS
25.0000 mg | ORAL_TABLET | Freq: Every day | ORAL | Status: DC
  Administered 2014-05-05 – 2014-05-06 (×2): 25 mg via ORAL
  Filled 2014-05-05 (×2): qty 1

## 2014-05-05 MED ORDER — MIDAZOLAM HCL 2 MG/2ML IJ SOLN
INTRAMUSCULAR | Status: AC
  Filled 2014-05-05: qty 2

## 2014-05-05 MED ORDER — MENTHOL 3 MG MT LOZG: 1.0000 | LOZENGE | OROMUCOSAL | Status: DC | PRN

## 2014-05-05 MED ORDER — PHENYLEPHRINE HCL 10 MG/ML IJ SOLN
INTRAMUSCULAR | Status: AC
  Filled 2014-05-05: qty 1

## 2014-05-05 MED ORDER — THROMBIN 5000 UNITS EX SOLR
CUTANEOUS | Status: AC
Start: 2014-05-05 — End: 2014-05-05
  Filled 2014-05-05: qty 5000

## 2014-05-05 MED ORDER — LOSARTAN POTASSIUM 50 MG PO TABS
100.0000 mg | ORAL_TABLET | Freq: Every day | ORAL | Status: DC
  Administered 2014-05-05 – 2014-05-06 (×2): 100 mg via ORAL
  Filled 2014-05-05 (×2): qty 2

## 2014-05-05 MED ORDER — HYDROMORPHONE HCL PF 1 MG/ML IJ SOLN
INTRAMUSCULAR | Status: AC
  Filled 2014-05-05: qty 1

## 2014-05-05 MED ORDER — LACTATED RINGERS IV SOLN
INTRAVENOUS | Status: DC
  Administered 2014-05-05: 1000 mL via INTRAVENOUS
  Administered 2014-05-05: 13:00:00 via INTRAVENOUS

## 2014-05-05 MED ORDER — HYDROMORPHONE HCL PF 1 MG/ML IJ SOLN
0.5000 mg | INTRAMUSCULAR | Status: DC | PRN
  Administered 2014-05-05 – 2014-05-06 (×3): 1 mg via INTRAVENOUS
  Filled 2014-05-05 (×3): qty 1

## 2014-05-05 MED ORDER — ACETAMINOPHEN 325 MG PO TABS: 650.0000 mg | ORAL_TABLET | Freq: Four times a day (QID) | ORAL | Status: DC | PRN

## 2014-05-05 MED ORDER — BUPIVACAINE LIPOSOME 1.3 % IJ SUSP
20.0000 mL | Freq: Once | INTRAMUSCULAR | Status: DC
  Filled 2014-05-05: qty 20

## 2014-05-05 MED ORDER — ONDANSETRON HCL 4 MG/2ML IJ SOLN
INTRAMUSCULAR | Status: AC
  Filled 2014-05-05: qty 2

## 2014-05-05 MED ORDER — MIDAZOLAM HCL 5 MG/5ML IJ SOLN
INTRAMUSCULAR | Status: DC | PRN
  Administered 2014-05-05: 2 mg via INTRAVENOUS

## 2014-05-05 MED ORDER — GLYCOPYRROLATE 1 MG PO TABS
2.0000 mg | ORAL_TABLET | Freq: Two times a day (BID) | ORAL | Status: DC
  Administered 2014-05-05 – 2014-05-06 (×2): 2 mg via ORAL
  Filled 2014-05-05 (×3): qty 2

## 2014-05-05 MED ORDER — LOSARTAN POTASSIUM-HCTZ 100-25 MG PO TABS: 1.0000 | ORAL_TABLET | Freq: Every morning | ORAL | Status: DC

## 2014-05-05 MED ORDER — PHENYLEPHRINE HCL 10 MG/ML IJ SOLN
INTRAMUSCULAR | Status: DC | PRN
  Administered 2014-05-05 (×5): 80 ug via INTRAVENOUS

## 2014-05-05 MED ORDER — CLINDAMYCIN PHOSPHATE 600 MG/50ML IV SOLN
600.0000 mg | Freq: Four times a day (QID) | INTRAVENOUS | Status: AC
  Administered 2014-05-05 – 2014-05-06 (×3): 600 mg via INTRAVENOUS
  Filled 2014-05-05 (×3): qty 50

## 2014-05-05 MED ORDER — LIDOCAINE HCL (PF) 2 % IJ SOLN
INTRAMUSCULAR | Status: DC | PRN
  Administered 2014-05-05: 20 mg via INTRADERMAL

## 2014-05-05 MED ORDER — VITAMIN E 180 MG (400 UNIT) PO CAPS
400.0000 [IU] | ORAL_CAPSULE | Freq: Every day | ORAL | Status: DC
  Administered 2014-05-06: 400 [IU] via ORAL
  Filled 2014-05-05: qty 1

## 2014-05-05 MED ORDER — PANTOPRAZOLE SODIUM 40 MG PO TBEC
40.0000 mg | DELAYED_RELEASE_TABLET | Freq: Every day | ORAL | Status: DC
  Administered 2014-05-06: 40 mg via ORAL
  Filled 2014-05-05 (×2): qty 1

## 2014-05-05 MED ORDER — EPHEDRINE SULFATE 50 MG/ML IJ SOLN
INTRAMUSCULAR | Status: AC
  Filled 2014-05-05: qty 1

## 2014-05-05 MED ORDER — EPHEDRINE SULFATE 50 MG/ML IJ SOLN
INTRAMUSCULAR | Status: DC | PRN
  Administered 2014-05-05: 10 mg via INTRAVENOUS

## 2014-05-05 MED ORDER — ACETAMINOPHEN 650 MG RE SUPP: 650.0000 mg | Freq: Four times a day (QID) | RECTAL | Status: DC | PRN

## 2014-05-05 MED ORDER — SUCCINYLCHOLINE CHLORIDE 20 MG/ML IJ SOLN
INTRAMUSCULAR | Status: DC | PRN
  Administered 2014-05-05: 100 mg via INTRAVENOUS

## 2014-05-05 MED ORDER — PROMETHAZINE HCL 25 MG/ML IJ SOLN: 6.2500 mg | INTRAMUSCULAR | Status: DC | PRN

## 2014-05-05 MED ORDER — CLINDAMYCIN PHOSPHATE 900 MG/50ML IV SOLN
900.0000 mg | INTRAVENOUS | Status: AC
  Administered 2014-05-05: 900 mg via INTRAVENOUS

## 2014-05-05 MED ORDER — LACTATED RINGERS IV SOLN
INTRAVENOUS | Status: DC
  Administered 2014-05-05 – 2014-05-06 (×2): via INTRAVENOUS

## 2014-05-05 MED ORDER — OXYCODONE-ACETAMINOPHEN 5-325 MG PO TABS: 2.0000 | ORAL_TABLET | ORAL | Status: DC | PRN

## 2014-05-05 MED ORDER — BISACODYL 5 MG PO TBEC: 5.0000 mg | DELAYED_RELEASE_TABLET | Freq: Every day | ORAL | Status: DC | PRN

## 2014-05-05 MED ORDER — FENTANYL CITRATE 0.05 MG/ML IJ SOLN
INTRAMUSCULAR | Status: DC | PRN
Start: 2014-05-05 — End: 2014-05-05
  Administered 2014-05-05 (×2): 50 ug via INTRAVENOUS
  Administered 2014-05-05: 100 ug via INTRAVENOUS

## 2014-05-05 MED ORDER — PHENOL 1.4 % MT LIQD: 1.0000 | OROMUCOSAL | Status: DC | PRN

## 2014-05-05 MED ORDER — PROPOFOL 10 MG/ML IV BOLUS
INTRAVENOUS | Status: DC | PRN
  Administered 2014-05-05: 50 mg via INTRAVENOUS
  Administered 2014-05-05: 150 mg via INTRAVENOUS

## 2014-05-05 MED ORDER — SODIUM CHLORIDE 0.9 % IJ SOLN
INTRAMUSCULAR | Status: AC
Start: 2014-05-05 — End: 2014-05-05
  Filled 2014-05-05: qty 10

## 2014-05-05 MED ORDER — BUPIVACAINE LIPOSOME 1.3 % IJ SUSP
INTRAMUSCULAR | Status: DC | PRN
  Administered 2014-05-05: 20 mL

## 2014-05-05 MED ORDER — CLINDAMYCIN PHOSPHATE 900 MG/50ML IV SOLN
INTRAVENOUS | Status: AC
  Filled 2014-05-05: qty 50

## 2014-05-05 MED ORDER — 0.9 % SODIUM CHLORIDE (POUR BTL) OPTIME
TOPICAL | Status: DC | PRN
  Administered 2014-05-05: 1000 mL

## 2014-05-05 SURGICAL SUPPLY — 52 items
ADH SKN CLS APL DERMABOND .7 (GAUZE/BANDAGES/DRESSINGS) ×1
ANCH SUT 2 5.5 BABSR ASCP (Orthopedic Implant) ×1 IMPLANT
ANCHOR PEEK ZIP 5.5 NDL NO2 (Orthopedic Implant) ×1 IMPLANT
BAG SPEC THK2 15X12 ZIP CLS (MISCELLANEOUS)
BAG ZIPLOCK 12X15 (MISCELLANEOUS) IMPLANT
BLADE OSCILLATING/SAGITTAL (BLADE) ×2
BLADE SW THK.38XMED LNG THN (BLADE) ×1 IMPLANT
BNDG COHESIVE 6X5 TAN NS LF (GAUZE/BANDAGES/DRESSINGS) ×2 IMPLANT
BUR OVAL CARBIDE 4.0 (BURR) ×2 IMPLANT
CLEANER TIP ELECTROSURG 2X2 (MISCELLANEOUS) ×2 IMPLANT
DERMABOND ADVANCED (GAUZE/BANDAGES/DRESSINGS) ×1
DERMABOND ADVANCED .7 DNX12 (GAUZE/BANDAGES/DRESSINGS) ×1 IMPLANT
DRAPE INCISE IOBAN 66X45 STRL (DRAPES) ×2 IMPLANT
DRAPE LG THREE QUARTER DISP (DRAPES) ×1 IMPLANT
DRAPE POUCH INSTRU U-SHP 10X18 (DRAPES) ×2 IMPLANT
DRSG AQUACEL AG ADV 3.5X 6 (GAUZE/BANDAGES/DRESSINGS) ×2 IMPLANT
DURAPREP 26ML APPLICATOR (WOUND CARE) ×2 IMPLANT
ELECT REM PT RETURN 9FT ADLT (ELECTROSURGICAL) ×2
ELECTRODE REM PT RTRN 9FT ADLT (ELECTROSURGICAL) ×1 IMPLANT
GLOVE BIOGEL PI IND STRL 6.5 (GLOVE) ×1 IMPLANT
GLOVE BIOGEL PI IND STRL 8 (GLOVE) ×1 IMPLANT
GLOVE BIOGEL PI INDICATOR 6.5 (GLOVE) ×3
GLOVE BIOGEL PI INDICATOR 8 (GLOVE) ×3
GLOVE ECLIPSE 8.0 STRL XLNG CF (GLOVE) ×2 IMPLANT
GLOVE SURG SS PI 6.5 STRL IVOR (GLOVE) ×2 IMPLANT
GOWN STRL REUS W/TWL LRG LVL3 (GOWN DISPOSABLE) ×2 IMPLANT
GOWN STRL REUS W/TWL XL LVL3 (GOWN DISPOSABLE) ×2 IMPLANT
IMMOBILIZER SHOULDER SM CANVAS (SOFTGOODS) ×1 IMPLANT
KIT BASIN OR (CUSTOM PROCEDURE TRAY) ×2 IMPLANT
KIT POSITION SHOULDER SCHLEI (MISCELLANEOUS) ×2 IMPLANT
MANIFOLD NEPTUNE II (INSTRUMENTS) ×2 IMPLANT
NDL MA TROC 1/2 (NEEDLE) IMPLANT
NEEDLE MA TROC 1/2 (NEEDLE) IMPLANT
NS IRRIG 1000ML POUR BTL (IV SOLUTION) IMPLANT
PACK SHOULDER CUSTOM OPM052 (CUSTOM PROCEDURE TRAY) ×2 IMPLANT
PATCH TISSUE MEND 3X3CM (Orthopedic Implant) ×1 IMPLANT
POSITIONER SURGICAL ARM (MISCELLANEOUS) ×2 IMPLANT
SLING ARM IMMOBILIZER LRG (SOFTGOODS) ×2 IMPLANT
SPONGE SURGIFOAM ABS GEL 100 (HEMOSTASIS) ×2 IMPLANT
STAPLER VISISTAT 35W (STAPLE) IMPLANT
SUCTION FRAZIER 12FR DISP (SUCTIONS) ×2 IMPLANT
SUT BONE WAX W31G (SUTURE) ×2 IMPLANT
SUT ETHIBOND NAB CT1 #1 30IN (SUTURE) ×2 IMPLANT
SUT MNCRL AB 4-0 PS2 18 (SUTURE) ×2 IMPLANT
SUT VIC AB 0 CT1 27 (SUTURE)
SUT VIC AB 0 CT1 27XBRD ANTBC (SUTURE) IMPLANT
SUT VIC AB 1 CT1 27 (SUTURE) ×4
SUT VIC AB 1 CT1 27XBRD ANTBC (SUTURE) ×2 IMPLANT
SUT VIC AB 2-0 CT1 27 (SUTURE) ×8
SUT VIC AB 2-0 CT1 27XBRD (SUTURE) ×2 IMPLANT
SUT VIC AB 2-0 CT1 TAPERPNT 27 (SUTURE) IMPLANT
TOWEL OR 17X26 10 PK STRL BLUE (TOWEL DISPOSABLE) ×2 IMPLANT

## 2014-05-05 NOTE — Brief Op Note (Signed)
05/05/2014  12:37 PM  PATIENT:  Enedina Finner Lyles-Williams  68 y.o. female  PRE-OPERATIVE DIAGNOSIS:  left shoulder rotator cuff tear and Morbid Obesity  POST-OPERATIVE DIAGNOSIS:  left shoulder rotator cuff tear and Morbid Obesity  PROCEDURE:  Procedure(s): OPEN ACROMIONECTOMY AND OPEN REPAIR OF ROTATOR CUFF, TISSUEMEND GRAFT WITH ANCHOR  (Left)  SURGEON:  Surgeon(s) and Role:    * Tobi Bastos, MD - Primary  PHYSICIAN ASSISTANT: Ardeen Jourdain PA  ASSISTANTS: Ardeen Jourdain PA  ANESTHESIA:   general  EBL:     BLOOD ADMINISTERED:none  DRAINS: none   LOCAL MEDICATIONS USED:  BUPIVICAINE 20cc  SPECIMEN:  No Specimen  DISPOSITION OF SPECIMEN:  N/A  COUNTS:  YES  TOURNIQUET:  * No tourniquets in log *  DICTATION: .Other Dictation: Dictation Number [id number]  PLAN OF CARE: Admit for overnight observation  PATIENT DISPOSITION:  Stable in OR   Delay start of Pharmacological VTE agent (>24hrs) due to surgical blood loss or risk of bleeding: yes

## 2014-05-05 NOTE — Interval H&P Note (Signed)
History and Physical Interval Note:  05/05/2014 10:50 AM  Amy [name]  has presented today for surgery, with the diagnosis of left shoulder rotator cuff tear  The various methods of treatment have been discussed with the patient and family. After consideration of risks, benefits and other options for treatment, the patient has consented to  Procedure(s): [place] (Left) as a surgical intervention .  The patient's history has been reviewed, patient examined, no change in status, stable for surgery.  I have reviewed the patient's chart and labs.  Questions were answered to the patient's satisfaction.     GIOFFRE,RONALD A

## 2014-05-05 NOTE — Interval H&P Note (Signed)
History and Physical Interval Note:  05/05/2014 10:48 AM  Amy [name]  has presented today for surgery, with the diagnosis of left shoulder rotator cuff tear  The various methods of treatment have been discussed with the patient and family. After consideration of risks, benefits and other options for treatment, the patient has consented to  Procedure(s): [name] (Left) as a surgical intervention .  The patient's history has been reviewed, patient examined, no change in status, stable for surgery.  I have reviewed the patient's chart and labs.  Questions were answered to the patient's satisfaction.     GIOFFRE,RONALD A

## 2014-05-05 NOTE — Anesthesia Preprocedure Evaluation (Addendum)
Anesthesia Evaluation  Patient identified by MRN, date of birth, ID band Patient awake    Reviewed: Allergy & Precautions, H&P , NPO status , Patient's Chart, lab work & pertinent test results  Airway Mallampati: II TM Distance: >3 FB Neck ROM: Full    Dental  (+) Implants, Dental Advisory Given,  Upper implants.:   Pulmonary shortness of breath, asthma , sleep apnea , former smoker,  breath sounds clear to auscultation  Pulmonary exam normal       Cardiovascular hypertension, Pt. on medications + dysrhythmias Rhythm:Regular Rate:Normal     Neuro/Psych  Headaches, PSYCHIATRIC DISORDERS Anxiety Depression  Neuromuscular disease    GI/Hepatic Neg liver ROS, GERD-  Medicated,  Endo/Other  Morbid obesity  Renal/GU negative Renal ROS  negative genitourinary   Musculoskeletal  (+) Fibromyalgia -  Abdominal (+) + obese,   Peds negative pediatric ROS (+)  Hematology negative hematology ROS (+)   Anesthesia Other Findings   Reproductive/Obstetrics negative OB ROS                         Anesthesia Physical Anesthesia Plan  ASA: III  Anesthesia Plan: General   Post-op Pain Management:    Induction: Intravenous  Airway Management Planned: Oral ETT  Additional Equipment:   Intra-op Plan:   Post-operative Plan: Extubation in OR  Informed Consent: I have reviewed the patients History and Physical, chart, labs and discussed the procedure including the risks, benefits and alternatives for the proposed anesthesia with the patient or authorized representative who has indicated his/her understanding and acceptance.   Dental advisory given  Plan Discussed with: CRNA  Anesthesia Plan Comments:         Anesthesia Quick Evaluation

## 2014-05-05 NOTE — Anesthesia Postprocedure Evaluation (Signed)
  Anesthesia Post-op Note  Patient: Amy [name]  Procedure(s) Performed: Procedure(s) (LRB): OPEN ACROMIONECTOMY AND OPEN REPAIR OF ROTATOR CUFF, TISSUEMEND GRAFT WITH ANCHOR  (Left)  Patient Location: PACU  Anesthesia Type: General  Level of Consciousness: awake and alert   Airway and Oxygen Therapy: Patient Spontanous Breathing  Post-op Pain: mild  Post-op Assessment: Post-op Vital signs reviewed, Patient's Cardiovascular Status Stable, Respiratory Function Stable, Patent Airway and No signs of Nausea or vomiting  Last Vitals:  Filed Vitals:   05/05/14 1617  BP: 111/54  Pulse: 101  Temp: 36.7 C  Resp: 18    Post-op Vital Signs: stable   Complications: No apparent anesthesia complications

## 2014-05-05 NOTE — Transfer of Care (Signed)
Immediate Anesthesia Transfer of Care Note  Patient: Amy [name]  Procedure(s) Performed: Procedure(s) (LRB): OPEN ACROMIONECTOMY AND OPEN REPAIR OF ROTATOR CUFF, TISSUEMEND GRAFT WITH ANCHOR  (Left)  Patient Location: PACU  Anesthesia Type: General  Level of Consciousness: sedated, patient cooperative and responds to stimulation  Airway & Oxygen Therapy: Patient Spontanous Breathing and Patient connected to face mask oxgen  Post-op Assessment: Report given to PACU RN and Post -op Vital signs reviewed and stable  Post vital signs: Reviewed and stable  Complications: No apparent anesthesia complications

## 2014-05-06 ENCOUNTER — Encounter (HOSPITAL_COMMUNITY): Payer: Self-pay | Admitting: Orthopedic Surgery

## 2014-05-06 DIAGNOSIS — S43429A Sprain of unspecified rotator cuff capsule, initial encounter: Secondary | ICD-10-CM | POA: Diagnosis not present

## 2014-05-06 MED ORDER — HYDROCODONE-ACETAMINOPHEN 5-325 MG PO TABS: 1.0000 | ORAL_TABLET | ORAL | Status: DC | PRN

## 2014-05-06 MED ORDER — METHOCARBAMOL 500 MG PO TABS: 500.0000 mg | ORAL_TABLET | Freq: Four times a day (QID) | ORAL | Status: DC | PRN

## 2014-05-06 NOTE — Evaluation (Signed)
Occupational Therapy Evaluation Patient Details Name: Amy [name] MRN: [id number] DOB: [birth year] Today's Date: 05/06/2014    History of Present Illness s/p L RCR and acromionectomy   Clinical Impression   Pt and husband educated in compensatory strategies for bathing and dressing.  Instructed in sling use and positioning to increase comfort in bed and in chair. No further OT needs.    Follow Up Recommendations  No OT follow up;Supervision - Intermittent    Equipment Recommendations       Recommendations for Other Services       Precautions / Restrictions Precautions Precautions: Shoulder Shoulder Interventions: Shoulder sling/immobilizer Precaution Booklet Issued: Yes (comment) Restrictions Other Position/Activity Restrictions: instructed pt to get up to R side of be to avoid pushing up on L UE      Mobility Bed Mobility Overal bed mobility: Modified Independent             General bed mobility comments: toward R side, pt has an adjustable bed at home  Transfers Overall transfer level:  (supervision due to meds)                    Balance                                            ADL Overall ADL's : Needs assistance/impaired Eating/Feeding: Set up;Sitting   Grooming: Wash/dry hands;Wash/dry face;Set up;Sitting   Upper Body Bathing: Minimal assitance;Sitting   Lower Body Bathing: Minimal assistance;Sit to/from stand   Upper Body Dressing : Maximal assistance;Sitting   Lower Body Dressing: Moderate assistance;Sit to/from stand   Toilet Transfer: Supervision/safety;BSC   Toileting- Water quality scientist and Hygiene: Minimal assistance;Sit to/from stand       Functional mobility during ADLs: Supervision/safety General ADL Comments: Instructed pt in compensatory strategies for bathing, dressing and assisted pt in dressing as husband observed.  Instructed in correct position and donning and doffing immobilizer and  use of pillows when seated and in bed to support shoulder to decrease pain.     Vision                     Perception     Praxis      Pertinent Vitals/Pain Pain Assessment: 0-10 Pain Score:  (severe per pt, did not rate) Pain Location: L shoulder Pain Descriptors / Indicators: Aching Pain Intervention(s): Limited activity within patient's tolerance;RN gave pain meds during session;Repositioned     Hand Dominance Right   Extremity/Trunk Assessment Upper Extremity Assessment Upper Extremity Assessment: LUE deficits/detail LUE Deficits / Details: no active shoulder movement LUE: Unable to fully assess due to immobilization LUE Coordination: decreased gross motor   Lower Extremity Assessment Lower Extremity Assessment: Overall WFL for tasks assessed   Cervical / Trunk Assessment Cervical / Trunk Assessment: Normal   Communication Communication Communication: No difficulties   Cognition Arousal/Alertness: Lethargic;Suspect due to medications Behavior During Therapy: [facility] for tasks assessed/performed Overall Cognitive Status: Within Functional Limits for tasks assessed                     General Comments       Exercises       Shoulder Instructions      Home Living Family/patient expects to be discharged to:: Private residence Living Arrangements: Spouse/significant other Available Help at Discharge: Family;Available 24 hours/day Type of Home: House  Home Access: Stairs to enter Entrance Stairs-Number of Steps: 1 Entrance Stairs-Rails: None Home Layout: Two level;Bed/bath upstairs Alternate Level Stairs-Number of Steps: 12 Alternate Level Stairs-Rails: Left Bathroom Shower/Tub: Teacher, early years/pre: Standard     Home Equipment: Cane - single point   Additional Comments: tub shower no seat      Prior Functioning/Environment Level of Independence: Independent with assistive device(s)        Comments: used cane    OT  Diagnosis:     OT Problem List:     OT Treatment/Interventions:      OT Goals(Current goals can be found in the care plan section) Acute Rehab OT Goals Patient Stated Goal: relief of pain  OT Frequency:     Barriers to D/C:            Co-evaluation              End of Session Nurse Communication:  (education completed)  Activity Tolerance: Patient limited by lethargy;Patient limited by pain Patient left: in bed;with call bell/phone within reach;with family/visitor present   Time: 7741-2878 OT Time Calculation (min): 37 min Charges:  OT General Charges $OT Visit: 1 Procedure OT Evaluation $Initial OT Evaluation Tier I: 1 Procedure OT Treatments $Self Care/Home Management : 23-37 mins G-Codes: OT G-codes **NOT FOR INPATIENT CLASS** Functional Assessment Tool Used: clinical judgement Functional Limitation: Self care Self Care Current Status (M7672): At least 40 percent but less than 60 percent impaired, limited or restricted Self Care Goal Status (C9470): At least 40 percent but less than 60 percent impaired, limited or restricted Self Care Discharge Status [phone]): At least 40 percent but less than 60 percent impaired, limited or restricted  Malka So 05/06/2014, 10:57 AM [phone]

## 2014-05-06 NOTE — Progress Notes (Signed)
Subjective: 1 Day Post-Op Procedure(s) (LRB): OPEN ACROMIONECTOMY AND OPEN REPAIR OF ROTATOR CUFF, TISSUEMEND GRAFT WITH ANCHOR  (Left) Patient reports pain as 3 on 0-10 scale.Doing Well today.    Objective: Vital signs in last 24 hours: Temp:  [97.9 F (36.6 C)-100 F (37.8 C)] 98 F (36.7 C) (08/13 0628) Pulse Rate:  [89-101] 89 (08/13 0628) Resp:  [12-19] 18 (08/13 0628) BP: (100-149)/(54-84) 135/84 mmHg (08/13 0628) SpO2:  [93 %-100 %] 98 % (08/13 0628) Weight:  [122.471 kg (270 lb)] 122.471 kg (270 lb) (08/12 1417)  Intake/Output from previous day: 08/12 0701 - 08/13 0700 In: 2650 [I.V.:2500; IV Piggyback:150] Out: 700 [Urine:700] Intake/Output this shift:    No results found for this basename: HGB,  in the last 72 hours No results found for this basename: WBC, RBC, HCT, PLT,  in the last 72 hours No results found for this basename: NA, K, CL, CO2, BUN, CREATININE, GLUCOSE, CALCIUM,  in the last 72 hours No results found for this basename: LABPT, INR,  in the last 72 hours  No cellulitis present  Assessment/Plan: 1 Day Post-Op Procedure(s) (LRB): OPEN ACROMIONECTOMY AND OPEN REPAIR OF ROTATOR CUFF, TISSUEMEND GRAFT WITH ANCHOR  (Left) Discharge home with home health  GIOFFRE,RONALD A 05/06/2014, 7:18 AM

## 2014-05-06 NOTE — Discharge Instructions (Signed)
Keep your sling on at all times, including sleeping in your sling. °The only time you should remove your sling is to shower only but you need to keep your hand against your chest while you shower.   °Call Dr. Gioffre if any wound complications or temperature of 101 degrees F or over.  °Call the office for an appointment to see Dr. Gioffre in two weeks: [phone] and ask for Dr. Gioffre's nurse, Tammy Johnson.  °

## 2014-05-06 NOTE — Progress Notes (Signed)
RN reviewed discharge education with patient and husband. All questions answered.   Paperwork and prescriptions given to patient.   NT rolled patient out in wheelchair to family car.

## 2014-05-06 NOTE — Op Note (Signed)
NAMESHEYANNE, [name] NO.:  [id number]  MEDICAL RECORD NO.:  [id number]  LOCATION:  75                         FACILITY:  [facility]  PHYSICIAN:  Kipp Brood. Gioffre, M.D.DATE OF BIRTH:  1946-06-29  DATE OF PROCEDURE:  05/05/2014 DATE OF DISCHARGE:                              OPERATIVE REPORT   SURGEON:  Kipp Brood. Gladstone Lighter, MD  ASSISTANT:  Ardeen Jourdain, PA  PREOPERATIVE DIAGNOSES: 1. Complete tear of the rotator cuff tendon, left shoulder. 2. Severe impingement syndrome, left shoulder. 3. Morbid obesity.  POSTOPERATIVE DIAGNOSES: 1. Complete tear of the rotator cuff tendon, left shoulder. 2. Severe impingement syndrome, left shoulder. 3. Morbid obesity.  OPERATION: 1. Open acromionectomy and acromioplasty, left shoulder. 2. Repair of a rotator cuff tendon tear of left shoulder complex. 3. TissueMend graft with one anchor for reinforcement of the repair of     left shoulder.  DESCRIPTION OF PROCEDURE:  Under general anesthesia, routine orthopedic prep and draping of the left shoulder was carried out.  The appropriate time-out was carried out.  The patient had Cleocin 900 mg.  The appropriate time-out as I mentioned was carried out in the operating room first.  We also marked the appropriate left shoulder in the holding area.  With the patient in a beach-chair position, an incision was made over the anterior aspect of the left shoulder.  Bleeders were identified and cauterized.  At this time, self-retaining retractors were inserted. I then partially detached the deltoid tendon from the acromion and split the proximal part of the deltoid muscle.  Immediately upon inspecting the shoulder, she had severe overgrowth, thickening, and downsloping of the acromion.  The acromion literally was impinging into the rotator cuff.  We first protected the underlying cuff in usual fashion.  I then utilized the oscillating saw and bur to do an acromionectomy  and acromioplasty.  At this particular time, we then bone waxed the undersurface of the acromion after we irrigated the shoulder and then went down and identified the rotator cuff, did a primary repair of the cuff with #1 Ethibond suture.  I then inserted one Stryker anchor and then utilized a TissueMend graft 3 x 3 to reinforce the repair.  I then sutured it down in place.  I thoroughly irrigated out the area and then reapproximated the deltoid tendon muscle in usual fashion.  The main part of the wound was closed in the usual fashion with a running Monocryl suture.  Sterile dressings were applied.  The patient will be placed in a shoulder immobilizer.          ______________________________ Kipp Brood Gladstone Lighter, M.D.     RAG/MEDQ  D:  05/05/2014  T:  05/05/2014  Job:  [id number]

## 2014-05-10 DIAGNOSIS — I10 Essential (primary) hypertension: Secondary | ICD-10-CM | POA: Diagnosis not present

## 2014-05-12 NOTE — Discharge Summary (Signed)
Physician Discharge Summary   Patient ID: Amy [name] MRN: [id number] DOB/AGE: [birth year] 68 y.o.  Admit date: 05/05/2014 Discharge date: 05/06/2014  Primary Diagnosis: left shoulder, rotator cuff tear  Admission Diagnoses:  Past Medical History  Diagnosis Date  . Anxiety   . Dysphonia     intermittent "voice changes"  . Chronic back pain     steroid injection last 01-20-13 next 02-19-13  . Arthritis     spinal fusion-lumbar; reains with chronic pain, steroid injections at present  . History of blood clots     tx. '89-Heparin and Coumadin-affected left arm  . Hypertension     takes Hyzaar and Plendil daily  . History of blood clots     right arm-about 70yr ago  . Sleep apnea     Dx.unable to tolerate cpap-no machine now  . Seasonal allergies     takes Benadryl daily prn  . Asthma     pt was told she has "sleeping asthma"  . Headache(784.0)     occasionally  . Fibromyalgia   . Weakness     both hands and feet  . Chronic back pain   . Constipation   . Urinary frequency     takes Rapaflo daily prn  . History of blood transfusion   . Glaucoma   . Depression     takes Paxil daily  . Insomnia     occasionally and no meds required  . History of MRSA infection 2011  . GERD (gastroesophageal reflux disease)     prone to throat spasms related to reflux; takes Nexium daily and Robinul bid  . Dysrhythmia     fast heart beat   . Shortness of breath     with exertion    Discharge Diagnoses:   Active Problems:   Complete rotator cuff tear of left shoulder  Estimated body mass index is 41.06 kg/(m^2) as calculated from the following:   Height as of this encounter: _0  (1.727 m).   Weight as of this encounter: 122.471 kg (270 lb).  Procedure:  Procedure(s) (LRB): OPEN ACROMIONECTOMY AND OPEN REPAIR OF ROTATOR CUFF, TISSUEMEND GRAFT WITH ANCHOR  (Left)   Consults: None  HPI: The patient is a 68year old female who presented with the chief complaint of  left shoulder pain. She has had left shoulder pain for about 6 months with no known injury. She denies radiation of pain from the neck or down the arm as well as numbness and tingling. MRI showed complete tear of the left rotator cuff.   Laboratory Data: Hospital Outpatient Visit on 04/21/2014  Component Date Value Ref Range Status  . aPTT 04/21/2014 34  24 - 37 seconds Final  . Sodium 04/21/2014 138  137 - 147 mEq/L Final  . Potassium 04/21/2014 4.3  3.7 - 5.3 mEq/L Final  . Chloride 04/21/2014 98  96 - 112 mEq/L Final  . CO2 04/21/2014 28  19 - 32 mEq/L Final  . Glucose, Bld 04/21/2014 106* 70 - 99 mg/dL Final  . BUN 04/21/2014 18  6 - 23 mg/dL Final  . Creatinine, Ser 04/21/2014 0.87  0.50 - 1.10 mg/dL Final  . Calcium 04/21/2014 9.5  8.4 - 10.5 mg/dL Final  . Total Protein 04/21/2014 8.0  6.0 - 8.3 g/dL Final  . Albumin 04/21/2014 3.9  3.5 - 5.2 g/dL Final  . AST 04/21/2014 22  0 - 37 U/L Final  . ALT 04/21/2014 20  0 - 35 U/L Final  .  Alkaline Phosphatase 04/21/2014 106  39 - 117 U/L Final  . Total Bilirubin 04/21/2014 0.4  0.3 - 1.2 mg/dL Final  . GFR calc non Af Amer 04/21/2014 67* >90 mL/min Final  . GFR calc Af Amer 04/21/2014 78* >90 mL/min Final   Comment: (NOTE)                          The eGFR has been calculated using the CKD EPI equation.                          This calculation has not been validated in all clinical situations.                          eGFR's persistently <90 mL/min signify possible Chronic Kidney                          Disease.  . Anion gap 04/21/2014 12  5 - 15 Final  . Color, Urine 04/21/2014 YELLOW  YELLOW Final  . APPearance 04/21/2014 CLEAR  CLEAR Final  . Specific Gravity, Urine 04/21/2014 1.019  1.005 - 1.030 Final  . pH 04/21/2014 7.0  5.0 - 8.0 Final  . Glucose, UA 04/21/2014 NEGATIVE  NEGATIVE mg/dL Final  . Hgb urine dipstick 04/21/2014 NEGATIVE  NEGATIVE Final  . Bilirubin Urine 04/21/2014 NEGATIVE  NEGATIVE Final  . Ketones, ur  04/21/2014 NEGATIVE  NEGATIVE mg/dL Final  . Protein, ur 04/21/2014 NEGATIVE  NEGATIVE mg/dL Final  . Urobilinogen, UA 04/21/2014 1.0  0.0 - 1.0 mg/dL Final  . Nitrite 04/21/2014 NEGATIVE  NEGATIVE Final  . Leukocytes, UA 04/21/2014 NEGATIVE  NEGATIVE Final   MICROSCOPIC NOT DONE ON URINES WITH NEGATIVE PROTEIN, BLOOD, LEUKOCYTES, NITRITE, OR GLUCOSE <1000 mg/dL.  Marland Kitchen Prothrombin Time 04/21/2014 12.7  11.6 - 15.2 seconds Final  . INR 04/21/2014 0.95  0.00 - 1.49 Final  . WBC 04/21/2014 5.2  4.0 - 10.5 K/uL Final  . RBC 04/21/2014 4.55  3.87 - 5.11 MIL/uL Final  . Hemoglobin 04/21/2014 13.8  12.0 - 15.0 g/dL Final  . HCT 04/21/2014 41.7  36.0 - 46.0 % Final  . MCV 04/21/2014 91.6  78.0 - 100.0 fL Final  . MCH 04/21/2014 30.3  26.0 - 34.0 pg Final  . MCHC 04/21/2014 33.1  30.0 - 36.0 g/dL Final  . RDW 04/21/2014 14.9  11.5 - 15.5 % Final  . Platelets 04/21/2014 306  150 - 400 K/uL Final  . MRSA, PCR 04/21/2014 NEGATIVE  NEGATIVE Final  . Staphylococcus aureus 04/21/2014 POSITIVE* NEGATIVE Final   Comment:                                 The Xpert SA Assay (FDA                          approved for NASAL specimens                          in patients over 71 years of age),                          is one component of  a comprehensive surveillance                          program.  Test performance has                          been validated by [facility] for patients greater                          than or equal to 40 year old.                          It is not intended                          to diagnose infection nor to                          guide or monitor treatment.     X-Rays:Dg Chest 2 View  04/21/2014   CLINICAL DATA:  Preoperative rotator cuff tear repair; hypertension  EXAM: CHEST  2 VIEW  COMPARISON:  Feb 09, 2013  FINDINGS: Lungs are clear. Heart size and pulmonary vascularity are normal. No adenopathy. No bone lesions.  There is postoperative change in the lumbar spine.  IMPRESSION: No edema or consolidation.   Electronically Signed   By: Lowella Grip M.D.   On: 04/21/2014 10:23    EKG: Orders placed during the hospital encounter of 04/21/14  . EKG 12-LEAD  . EKG 12-LEAD     Hospital Course: Amy [name] is a 68 y.o. who was admitted to [facility]. They were brought to the operating room on 05/05/2014 and underwent Procedure(s): OPEN ACROMIONECTOMY AND OPEN REPAIR OF ROTATOR CUFF, TISSUEMEND GRAFT WITH ANCHOR .  Patient tolerated the procedure well and was later transferred to the recovery room and then to the orthopaedic floor for postoperative care.  They were given PO and IV analgesics for pain control following their surgery.  They were given 24 hours of postoperative antibiotics of  Anti-infectives   Start     Dose/Rate Route Frequency Ordered Stop   05/05/14 1700  clindamycin (CLEOCIN) IVPB 600 mg     600 mg 100 mL/hr over 30 Minutes Intravenous Every 6 hours 05/05/14 1440 05/06/14 0434   05/05/14 1211  polymyxin B 500,000 Units, bacitracin 50,000 Units in sodium chloride irrigation 0.9 % 500 mL irrigation  Status:  Discontinued       As needed 05/05/14 1211 05/05/14 1300   05/05/14 0958  clindamycin (CLEOCIN) IVPB 900 mg     900 mg 100 mL/hr over 30 Minutes Intravenous On call to O.R. 05/05/14 1607 05/05/14 1145     and started on DVT prophylaxis in the form of None.   PT and OT were ordered. Discharge planning consulted to help with postop disposition and equipment needs.  Patient had a fair night on the evening of surgery.  They started to get up OOB with therapy on day one. Patient was seen in rounds and was ready to go home.   Diet: Cardiac diet Activity:Wear sling on left shoulder at all times Follow-up:in 2 weeks Disposition - Home Discharged  Condition: stable   Discharge Instructions   Call MD / Call 911    Complete by:  As directed   If you experience chest  pain or shortness of breath, CALL 911 and be transported to the hospital emergency room.  If you develope a fever above 101 F, pus (white drainage) or increased drainage or redness at the wound, or calf pain, call your surgeon's office.     Constipation Prevention    Complete by:  As directed   Drink plenty of fluids.  Prune juice may be helpful.  You may use a stool softener, such as Colace (over the counter) 100 mg twice a day.  Use MiraLax (over the counter) for constipation as needed.     Diet - low sodium heart healthy    Complete by:  As directed      Discharge instructions    Complete by:  As directed   Keep your sling on at all times, including sleeping in your sling. The only time you should remove your sling is to shower only but you need to keep your hand against your chest while you shower.  Call Dr. Gladstone Lighter if any wound complications or temperature of 101 degrees F or over.  Call the office for an appointment to see Dr. Gladstone Lighter in two weeks: [phone] and ask for Dr. Charlestine Night nurse, Brunilda Payor.     Driving restrictions    Complete by:  As directed   No driving     Increase activity slowly as tolerated    Complete by:  As directed      Lifting restrictions    Complete by:  As directed   No lifting            Medication List    STOP taking these medications       traMADol 50 MG tablet  Commonly known as:  ULTRAM      TAKE these medications       acetaminophen 500 MG tablet  Commonly known as:  TYLENOL  Take 1,000 mg by mouth every 8 (eight) hours as needed for pain.     azelaic acid 20 % cream  Commonly known as:  AZELEX  Apply 1 application topically 2 (two) times daily. After skin is thoroughly washed and patted dry, gently but thoroughly massage a thin film of azelaic acid cream into the affected area twice daily, in the morning and evening.     calcium citrate-vitamin D 315-200 MG-UNIT per tablet  Commonly known as:  CITRACAL+D  Take 1 tablet by  mouth daily.     celecoxib 200 MG capsule  Commonly known as:  CELEBREX  Take 200 mg by mouth at bedtime.     diphenhydrAMINE 25 mg capsule  Commonly known as:  BENADRYL  Take 25 mg by mouth 2 (two) times daily as needed for allergies.     esomeprazole 40 MG capsule  Commonly known as:  NEXIUM  Take 40 mg by mouth 2 (two) times daily.     felodipine 10 MG 24 hr tablet  Commonly known as:  PLENDIL  Take 10 mg by mouth every morning.     glycopyrrolate 2 MG tablet  Commonly known as:  ROBINUL  Take 2 mg by mouth 2 (two) times daily.     HYDROcodone-acetaminophen 5-325 MG per tablet  Commonly known as:  NORCO/VICODIN  Take 1-2 tablets by mouth every 4 (four) hours as needed for moderate pain.     losartan-hydrochlorothiazide 100-25  MG per tablet  Commonly known as:  HYZAAR  Take 1 tablet by mouth every morning.     methocarbamol 500 MG tablet  Commonly known as:  ROBAXIN  Take 1 tablet (500 mg total) by mouth every 6 (six) hours as needed for muscle spasms.     multivitamin with minerals Tabs tablet  Take 1 tablet by mouth daily.     PARoxetine 30 MG tablet  Commonly known as:  PAXIL  Take 30 mg by mouth at bedtime.     POTASSIUM PO  Take 1 tablet by mouth daily.     silodosin 8 MG Caps capsule  Commonly known as:  RAPAFLO  Take 8 mg by mouth daily as needed (for bladder).     VITAMIN B COMPLEX PO  Take 1 tablet by mouth daily.     vitamin E 400 UNIT capsule  Generic drug:  vitamin E  Take 400 Units by mouth daily.           Follow-up Information   Follow up with GIOFFRE,RONALD A, MD. Schedule an appointment as soon as possible for a visit in 2 weeks.   Specialty:  Orthopedic Surgery   Contact information:   [address] [place] [address] [phone]       Signed: Ardeen Jourdain, PA-C Orthopaedic Surgery 05/12/2014, 8:10 AM

## 2014-06-28 DIAGNOSIS — M25512 Pain in left shoulder: Secondary | ICD-10-CM | POA: Diagnosis not present

## 2014-07-05 DIAGNOSIS — M25512 Pain in left shoulder: Secondary | ICD-10-CM | POA: Diagnosis not present

## 2014-07-08 DIAGNOSIS — M25512 Pain in left shoulder: Secondary | ICD-10-CM | POA: Diagnosis not present

## 2014-07-22 DIAGNOSIS — M25512 Pain in left shoulder: Secondary | ICD-10-CM | POA: Diagnosis not present

## 2014-07-28 DIAGNOSIS — M25512 Pain in left shoulder: Secondary | ICD-10-CM | POA: Diagnosis not present

## 2014-07-30 DIAGNOSIS — M25512 Pain in left shoulder: Secondary | ICD-10-CM | POA: Diagnosis not present

## 2014-08-02 DIAGNOSIS — Z23 Encounter for immunization: Secondary | ICD-10-CM | POA: Diagnosis not present

## 2014-08-02 DIAGNOSIS — F39 Unspecified mood [affective] disorder: Secondary | ICD-10-CM | POA: Diagnosis not present

## 2014-08-06 DIAGNOSIS — M25512 Pain in left shoulder: Secondary | ICD-10-CM | POA: Diagnosis not present

## 2014-08-14 DIAGNOSIS — Z4789 Encounter for other orthopedic aftercare: Secondary | ICD-10-CM | POA: Diagnosis not present

## 2014-08-25 IMAGING — CT CT L SPINE W/O CM
4 of 11 series · 10 of 33 positions shown, 12 images · non-contrast
Comparison: Radiographs 10/29/2013.  CT 11/17/2012.

CLINICAL DATA: Low back pain. Fall January 2014. Bilateral leg pain.
Bilateral leg swelling.

EXAM:
CT LUMBAR SPINE WITHOUT CONTRAST
TECHNIQUE: Multidetector CT imaging of the lumbar spine was performed without
intravenous contrast administration. Multiplanar CT image
reconstructions were also generated.

[Series 4: l spine bone · axial · 0.35mm/px · z∈[-42,+28]mm · 2 of 85 slices shown, 3 images]
[im 29/85  soft-tissue]
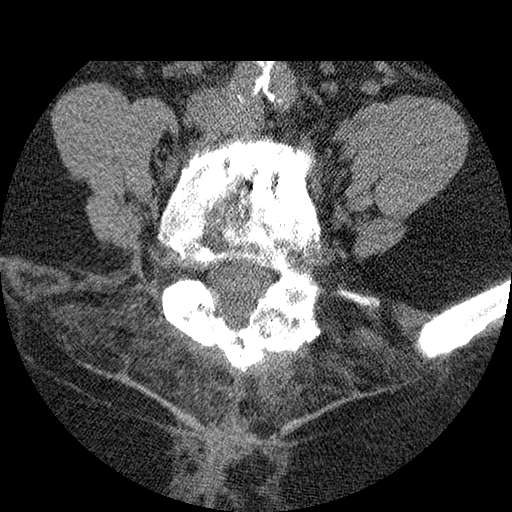
[im 29/85  bone]
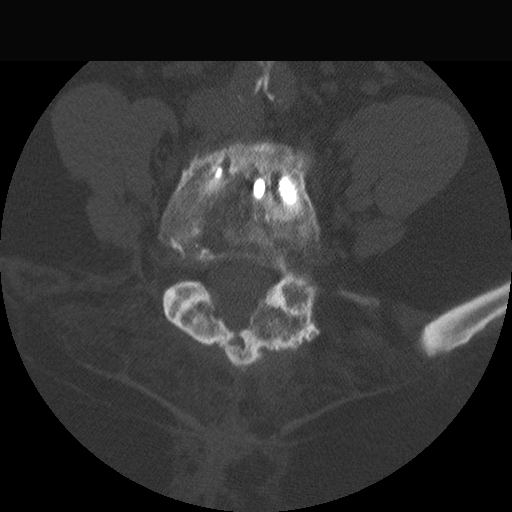
[im 57/85  bone]
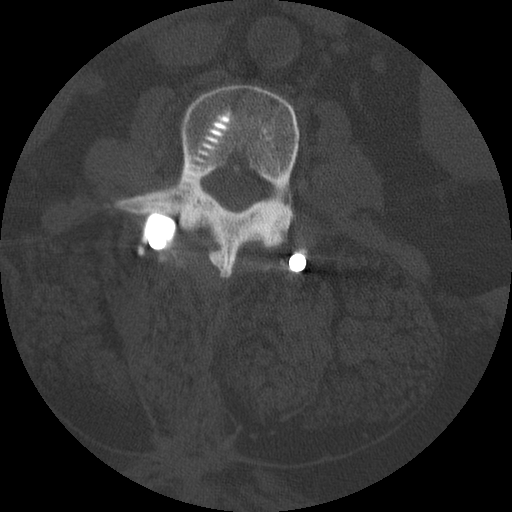

[Series 5: l-spine detail · axial · 0.35mm/px · z∈[-42,+28]mm · 2 of 85 slices shown]
[im 29/85  bone]
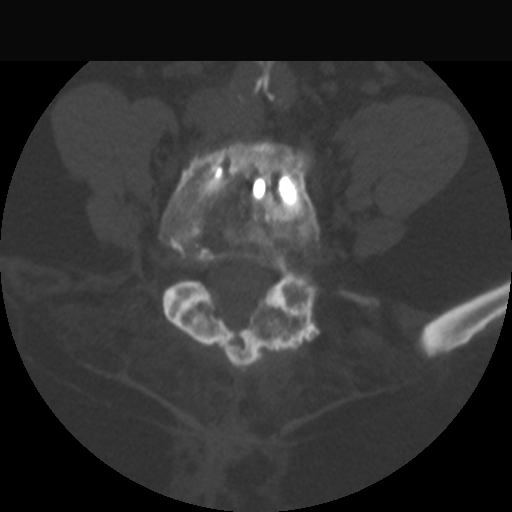
[im 57/85  bone]
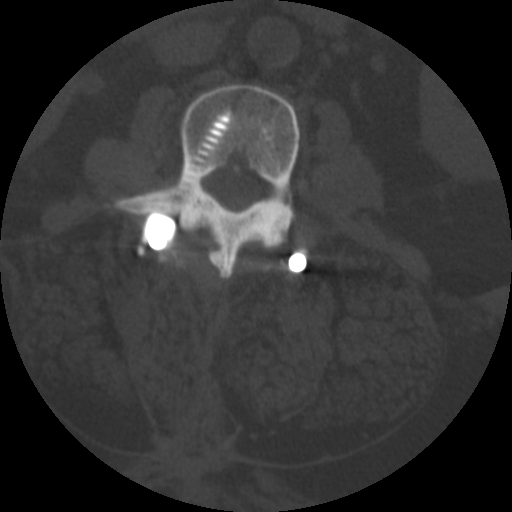

[Series 200: cor · coronal · 0.42mm/px · 1 of 58 slices shown]
[im 29/58  bone]
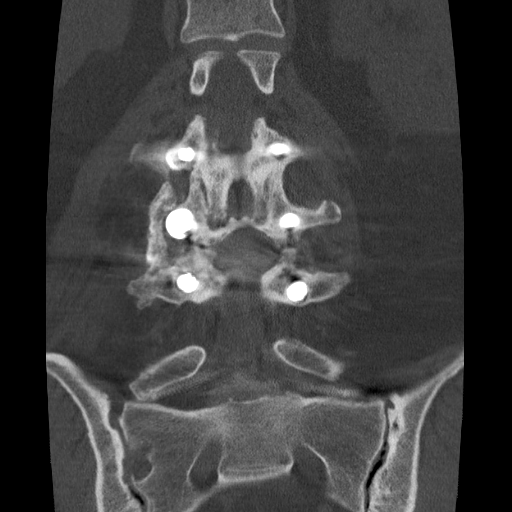

[Series 202: sag · sagittal · 0.42mm/px · 5 of 63 slices shown, 6 images]
[im 21/63  bone]
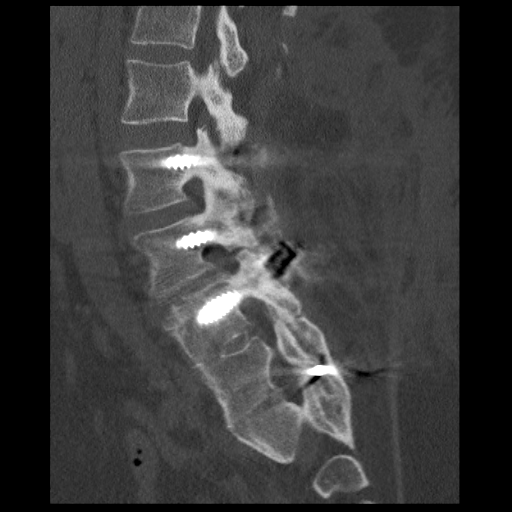
[im 26/63  bone]
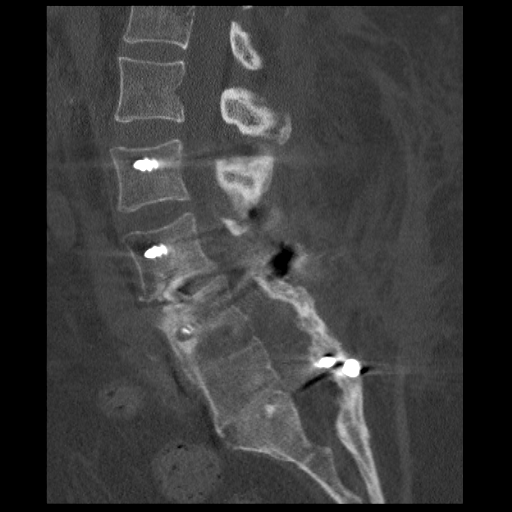
[im 32/63  soft-tissue]
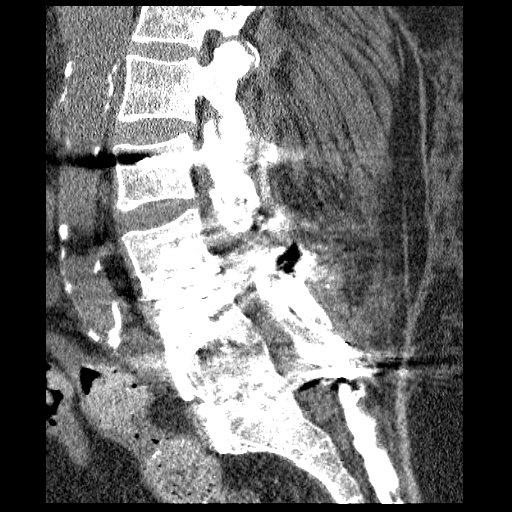
[im 32/63  bone]
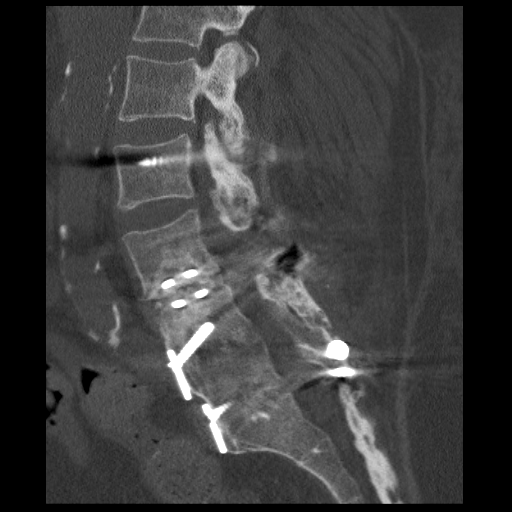
[im 37/63  bone]
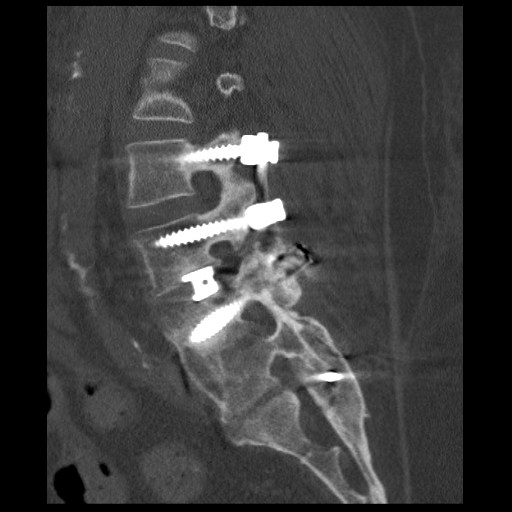
[im 42/63  bone]
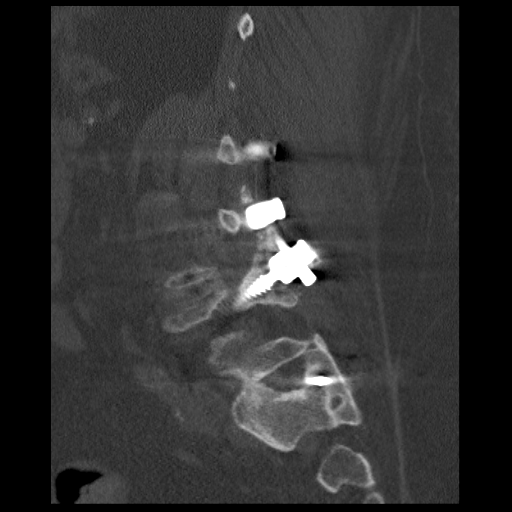

[10 of 33 positions shown; findings below may reference images not displayed]

FINDINGS: Segmentation: Numbering convention used on prior exam preserved.

Alignment: Straightening of the normal lower lumbar lordosis with
anterior fusion apparatus at L4-L5 and L5-S1.

Vertebrae: Fusion from L4 through S1 is solid, with confluent bone
along the posterior lateral region. There is interbody cage graft at
L3-L4 which although the evaluation is degraded by artifact from the
cage itself, there appears to be solid fusion. There is persistent
lucency around the L4 pedicle screws bilaterally however this
appears decreased compared to the prior exam, compatible with bony
in growth. No convincing loosening of the L3 pedicle screws. Solid
fusion is present across L2-L3 with the discs still present. L2-L3
hardware appears normal.

Paraspinal tissues: Atherosclerosis and postoperative changes in the
posterior paraspinal soft tissues. Severe bilateral SI joint
degenerative disease. Bone graft harvesting site in the posterior
medial RIGHT iliac bone.

Disc levels:

T12-L1:  Mild facet degeneration.  No stenosis.

L1-L2: Moderate to severe bilateral facet arthrosis. Central canal
appears adequately patent. Disc height is preserved. The foramina
appear adequately patent.

L2-L3: Laminectomy with posterior decompression. Solid fusion. No
protrusion.

L3-L4: There appears to be bridging bone across the L3-L4 disc
space. There is no posterior lateral fusion. Wide posterior
decompression and no recurrent stenosis.

L4-L5: Prior laminectomy with solid posterior bone graft. No
recurrent stenosis.

L5-S1: Bilateral facet arthrodesis screws. Solid fusion. No
recurrent stenosis. Previous laminectomy.
IMPRESSION: 1. Solid fusion from L2 through S1. Decreased lucency around the L4
pedicle screws compatible with in growth of bone associated with
fusion.
2. No recurrent stenosis at fusion levels from L2 through the
sacrum.
3. Adjacent segment L1-L2 facet arthrosis without stenosis.

## 2014-09-09 DIAGNOSIS — Z4789 Encounter for other orthopedic aftercare: Secondary | ICD-10-CM | POA: Diagnosis not present

## 2014-09-22 IMAGING — CR DG CHEST 2V
2 series · 2 of 2 positions shown · non-contrast
Comparison: February 09, 2013

CLINICAL DATA: Preoperative rotator cuff tear repair; hypertension

EXAM:
CHEST  2 VIEW

[w chest pa]
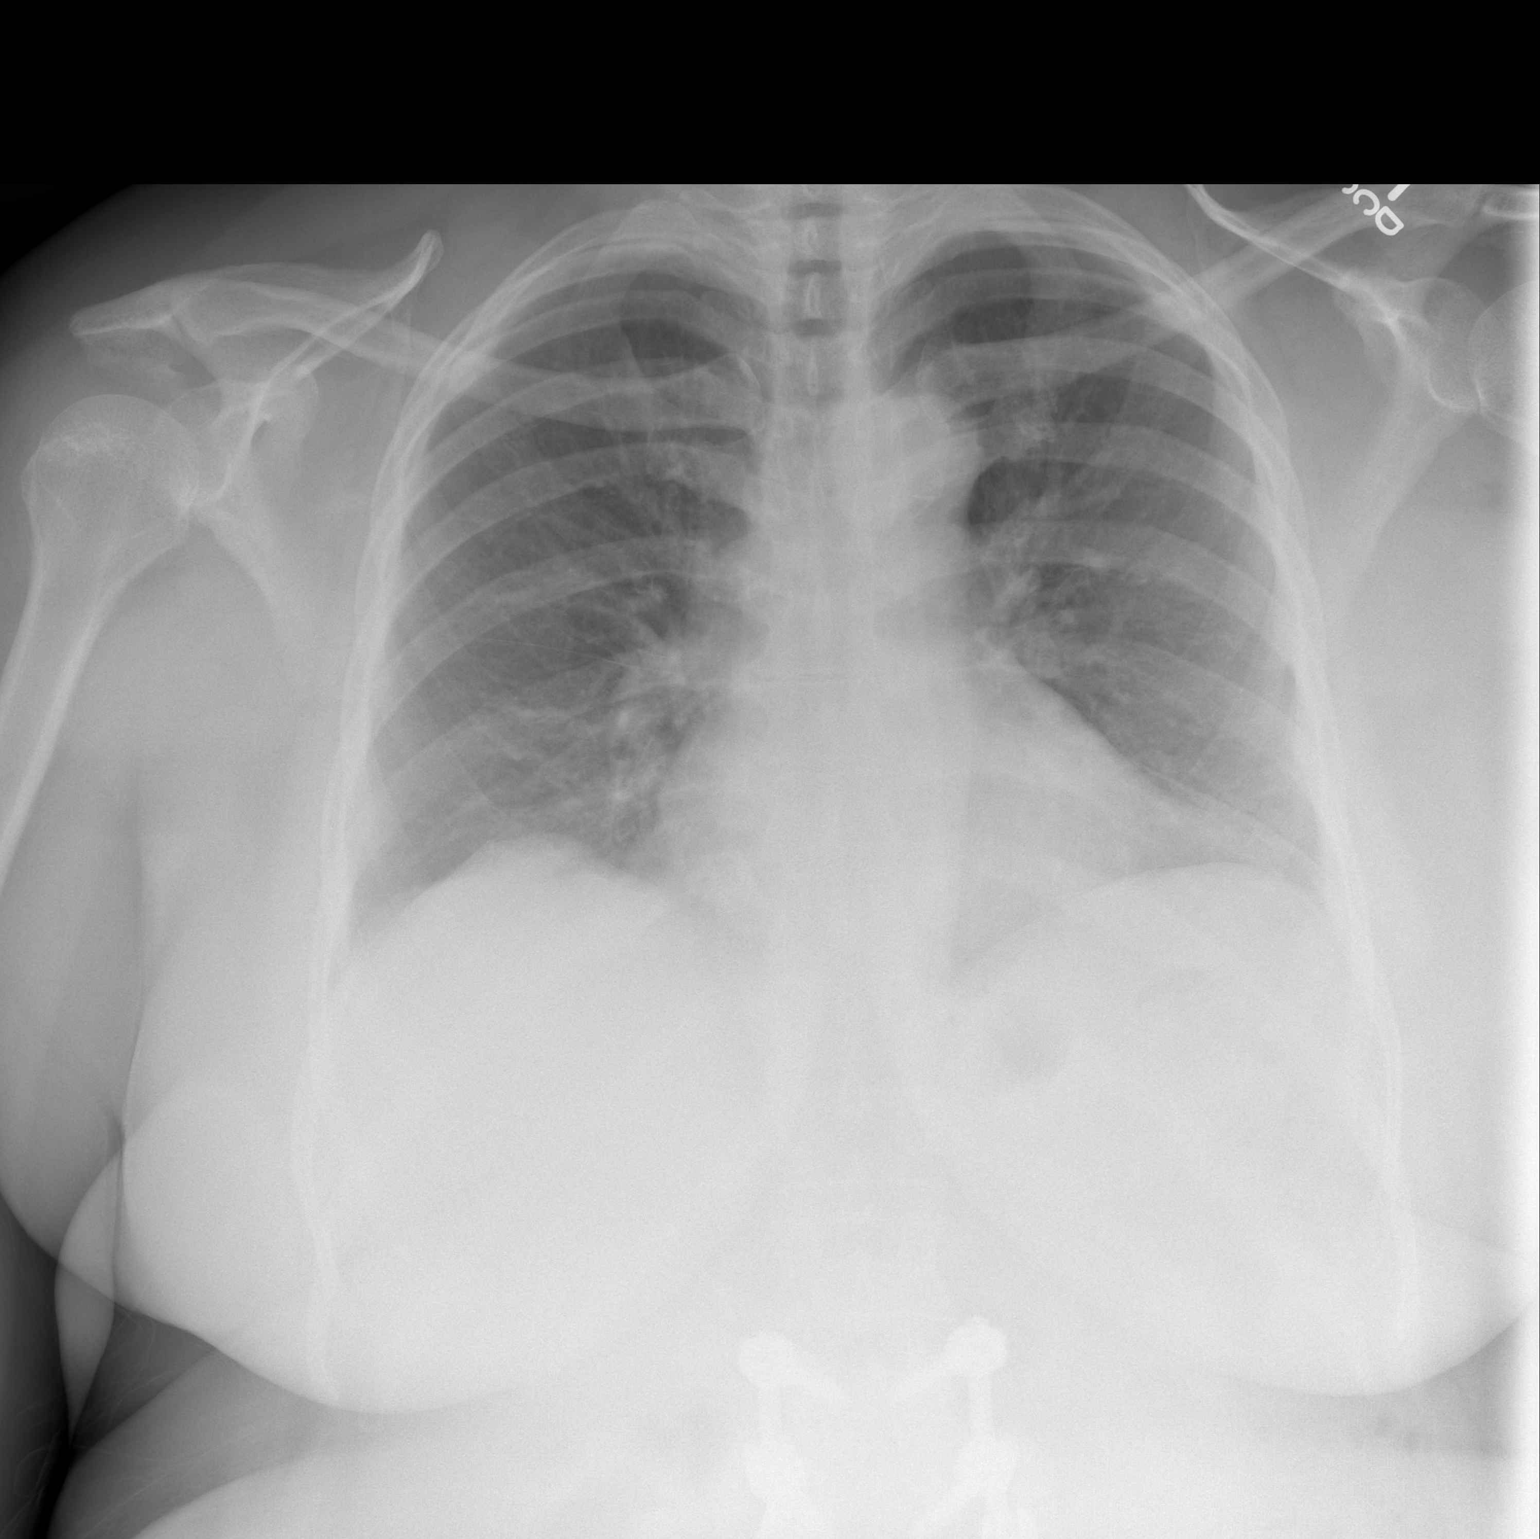

[w chest lat]
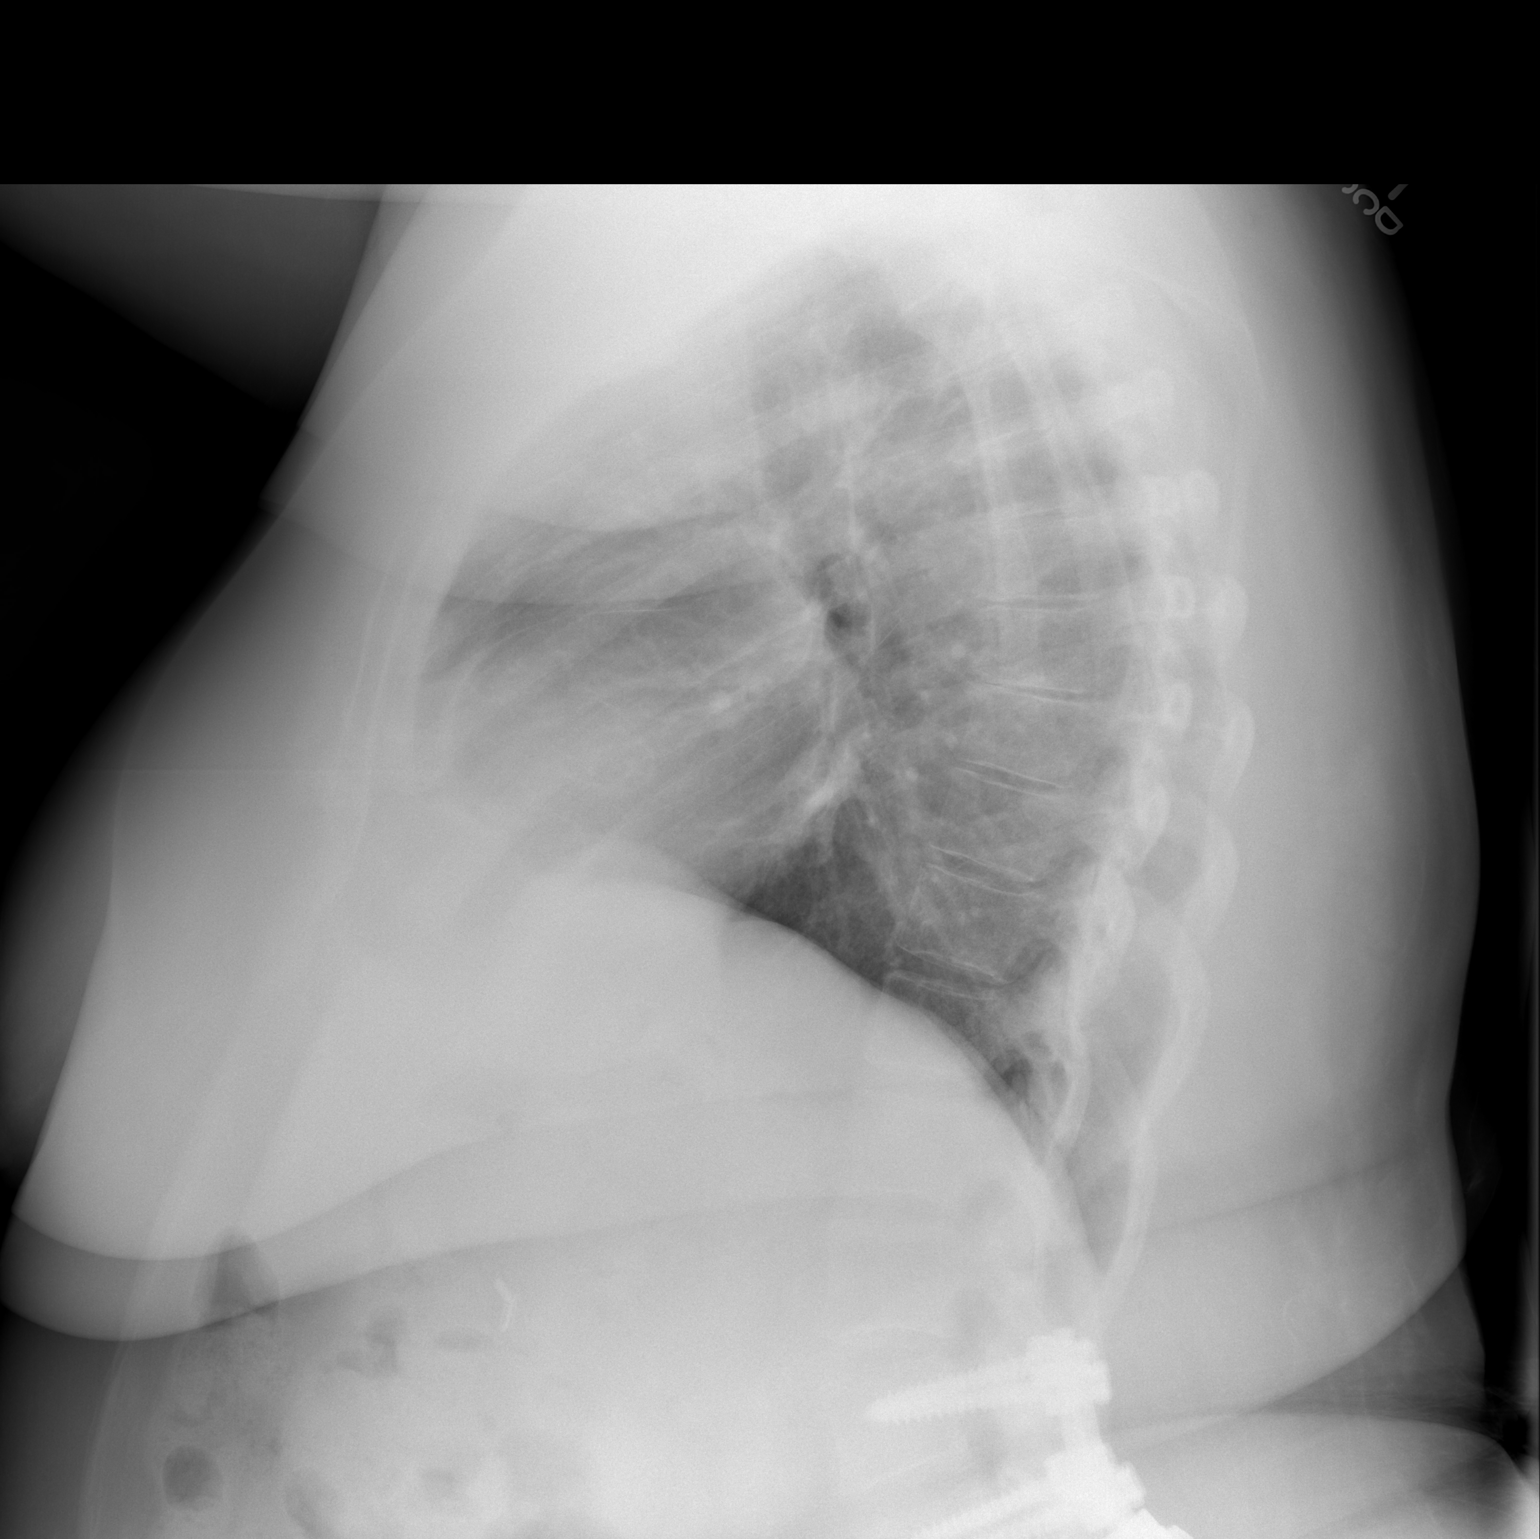

[2 of 2 positions shown; findings below may reference images not displayed]

FINDINGS: Lungs are clear. Heart size and pulmonary vascularity are normal. No
adenopathy. No bone lesions. There is postoperative change in the
lumbar spine.
IMPRESSION: No edema or consolidation.

## 2014-09-28 ENCOUNTER — Other Ambulatory Visit: Payer: Self-pay

## 2014-09-28 DIAGNOSIS — Z1231 Encounter for screening mammogram for malignant neoplasm of breast: Secondary | ICD-10-CM

## 2014-10-19 ENCOUNTER — Ambulatory Visit
Admission: RE | Admit: 2014-10-19 | Discharge: 2014-10-19 | Disposition: A | Discharge status: Home / Self Care | Payer: Medicare Other | Source: Ambulatory Visit

## 2014-10-19 DIAGNOSIS — Z1231 Encounter for screening mammogram for malignant neoplasm of breast: Secondary | ICD-10-CM | POA: Diagnosis not present

## 2014-10-21 DIAGNOSIS — Z4789 Encounter for other orthopedic aftercare: Secondary | ICD-10-CM | POA: Diagnosis not present

## 2014-10-26 DIAGNOSIS — R079 Chest pain, unspecified: Secondary | ICD-10-CM | POA: Diagnosis not present

## 2014-11-01 DIAGNOSIS — K219 Gastro-esophageal reflux disease without esophagitis: Secondary | ICD-10-CM | POA: Diagnosis not present

## 2014-11-01 DIAGNOSIS — F39 Unspecified mood [affective] disorder: Secondary | ICD-10-CM | POA: Diagnosis not present

## 2014-11-01 DIAGNOSIS — M719 Bursopathy, unspecified: Secondary | ICD-10-CM | POA: Diagnosis not present

## 2014-11-03 DIAGNOSIS — R1310 Dysphagia, unspecified: Secondary | ICD-10-CM | POA: Diagnosis not present

## 2014-11-22 ENCOUNTER — Encounter (HOSPITAL_COMMUNITY)
Admission: RE | Disposition: A | Discharge status: Home / Self Care | Payer: Self-pay | Source: Ambulatory Visit | Attending: Gastroenterology

## 2014-11-22 ENCOUNTER — Ambulatory Visit (HOSPITAL_COMMUNITY)
Admission: RE | Admit: 2014-11-22 | Discharge: 2014-11-22 | Disposition: A | Discharge status: Home / Self Care | Payer: Medicare Other | Source: Ambulatory Visit | Attending: Gastroenterology | Admitting: Gastroenterology

## 2014-11-22 DIAGNOSIS — R1310 Dysphagia, unspecified: Secondary | ICD-10-CM | POA: Diagnosis not present

## 2014-11-22 HISTORY — PX: ESOPHAGEAL MANOMETRY: SHX5429

## 2014-11-22 SURGERY — MANOMETRY, ESOPHAGUS

## 2014-11-22 MED ORDER — LIDOCAINE VISCOUS 2 % MT SOLN
OROMUCOSAL | Status: AC
  Filled 2014-11-22: qty 15

## 2014-11-22 SURGICAL SUPPLY — 2 items
FACESHIELD LNG OPTICON STERILE (SAFETY) IMPLANT
GLOVE BIO SURGEON STRL SZ8 (GLOVE) ×4 IMPLANT

## 2014-11-23 ENCOUNTER — Encounter (HOSPITAL_COMMUNITY): Payer: Self-pay | Admitting: Gastroenterology

## 2014-11-30 DIAGNOSIS — H25813 Combined forms of age-related cataract, bilateral: Secondary | ICD-10-CM | POA: Diagnosis not present

## 2014-12-09 DIAGNOSIS — H2512 Age-related nuclear cataract, left eye: Secondary | ICD-10-CM | POA: Diagnosis not present

## 2014-12-09 DIAGNOSIS — H269 Unspecified cataract: Secondary | ICD-10-CM | POA: Diagnosis not present

## 2014-12-16 DIAGNOSIS — K224 Dyskinesia of esophagus: Secondary | ICD-10-CM | POA: Diagnosis not present

## 2014-12-16 DIAGNOSIS — R1310 Dysphagia, unspecified: Secondary | ICD-10-CM | POA: Diagnosis not present

## 2014-12-28 DIAGNOSIS — H2511 Age-related nuclear cataract, right eye: Secondary | ICD-10-CM | POA: Diagnosis not present

## 2014-12-30 DIAGNOSIS — H268 Other specified cataract: Secondary | ICD-10-CM | POA: Diagnosis not present

## 2014-12-30 DIAGNOSIS — H2511 Age-related nuclear cataract, right eye: Secondary | ICD-10-CM | POA: Diagnosis not present

## 2015-01-06 DIAGNOSIS — Z0000 Encounter for general adult medical examination without abnormal findings: Secondary | ICD-10-CM | POA: Diagnosis not present

## 2015-01-06 DIAGNOSIS — M4806 Spinal stenosis, lumbar region: Secondary | ICD-10-CM | POA: Diagnosis not present

## 2015-01-06 DIAGNOSIS — K224 Dyskinesia of esophagus: Secondary | ICD-10-CM | POA: Diagnosis not present

## 2015-01-06 DIAGNOSIS — G4730 Sleep apnea, unspecified: Secondary | ICD-10-CM | POA: Diagnosis not present

## 2015-01-06 DIAGNOSIS — R51 Headache: Secondary | ICD-10-CM | POA: Diagnosis not present

## 2015-01-06 DIAGNOSIS — E780 Pure hypercholesterolemia: Secondary | ICD-10-CM | POA: Diagnosis not present

## 2015-01-06 DIAGNOSIS — J301 Allergic rhinitis due to pollen: Secondary | ICD-10-CM | POA: Diagnosis not present

## 2015-01-06 DIAGNOSIS — K219 Gastro-esophageal reflux disease without esophagitis: Secondary | ICD-10-CM | POA: Diagnosis not present

## 2015-01-06 DIAGNOSIS — M797 Fibromyalgia: Secondary | ICD-10-CM | POA: Diagnosis not present

## 2015-01-06 DIAGNOSIS — I10 Essential (primary) hypertension: Secondary | ICD-10-CM | POA: Diagnosis not present

## 2015-01-06 DIAGNOSIS — R1310 Dysphagia, unspecified: Secondary | ICD-10-CM | POA: Diagnosis not present

## 2015-01-06 DIAGNOSIS — Z1389 Encounter for screening for other disorder: Secondary | ICD-10-CM | POA: Diagnosis not present

## 2015-03-01 DIAGNOSIS — H43811 Vitreous degeneration, right eye: Secondary | ICD-10-CM | POA: Diagnosis not present

## 2015-03-15 DIAGNOSIS — K224 Dyskinesia of esophagus: Secondary | ICD-10-CM | POA: Diagnosis not present

## 2015-03-15 DIAGNOSIS — R1310 Dysphagia, unspecified: Secondary | ICD-10-CM | POA: Diagnosis not present

## 2015-03-22 DIAGNOSIS — I10 Essential (primary) hypertension: Secondary | ICD-10-CM | POA: Diagnosis not present

## 2015-03-22 DIAGNOSIS — M797 Fibromyalgia: Secondary | ICD-10-CM | POA: Diagnosis not present

## 2015-05-09 DIAGNOSIS — K224 Dyskinesia of esophagus: Secondary | ICD-10-CM | POA: Diagnosis not present

## 2015-05-09 DIAGNOSIS — N390 Urinary tract infection, site not specified: Secondary | ICD-10-CM | POA: Diagnosis not present

## 2015-05-09 DIAGNOSIS — G4730 Sleep apnea, unspecified: Secondary | ICD-10-CM | POA: Diagnosis not present

## 2015-05-09 DIAGNOSIS — E780 Pure hypercholesterolemia: Secondary | ICD-10-CM | POA: Diagnosis not present

## 2015-05-09 DIAGNOSIS — M545 Low back pain: Secondary | ICD-10-CM | POA: Diagnosis not present

## 2015-05-09 DIAGNOSIS — M4806 Spinal stenosis, lumbar region: Secondary | ICD-10-CM | POA: Diagnosis not present

## 2015-05-09 DIAGNOSIS — N3081 Other cystitis with hematuria: Secondary | ICD-10-CM | POA: Diagnosis not present

## 2015-05-09 DIAGNOSIS — M797 Fibromyalgia: Secondary | ICD-10-CM | POA: Diagnosis not present

## 2015-05-09 DIAGNOSIS — N760 Acute vaginitis: Secondary | ICD-10-CM | POA: Diagnosis not present

## 2015-05-09 DIAGNOSIS — I10 Essential (primary) hypertension: Secondary | ICD-10-CM | POA: Diagnosis not present

## 2015-05-19 DIAGNOSIS — G603 Idiopathic progressive neuropathy: Secondary | ICD-10-CM | POA: Diagnosis not present

## 2015-05-31 DIAGNOSIS — H6120 Impacted cerumen, unspecified ear: Secondary | ICD-10-CM | POA: Diagnosis not present

## 2015-05-31 DIAGNOSIS — R202 Paresthesia of skin: Secondary | ICD-10-CM | POA: Diagnosis not present

## 2015-05-31 DIAGNOSIS — K219 Gastro-esophageal reflux disease without esophagitis: Secondary | ICD-10-CM | POA: Diagnosis not present

## 2015-05-31 DIAGNOSIS — G629 Polyneuropathy, unspecified: Secondary | ICD-10-CM | POA: Diagnosis not present

## 2015-06-15 ENCOUNTER — Encounter: Payer: Medicare Other | Admitting: Neurology

## 2015-06-16 DIAGNOSIS — R109 Unspecified abdominal pain: Secondary | ICD-10-CM | POA: Diagnosis not present

## 2015-06-16 DIAGNOSIS — N3020 Other chronic cystitis without hematuria: Secondary | ICD-10-CM | POA: Diagnosis not present

## 2015-06-16 DIAGNOSIS — R3912 Poor urinary stream: Secondary | ICD-10-CM | POA: Diagnosis not present

## 2015-06-23 DIAGNOSIS — R109 Unspecified abdominal pain: Secondary | ICD-10-CM | POA: Diagnosis not present

## 2015-06-23 DIAGNOSIS — D3502 Benign neoplasm of left adrenal gland: Secondary | ICD-10-CM | POA: Diagnosis not present

## 2015-06-30 DIAGNOSIS — R109 Unspecified abdominal pain: Secondary | ICD-10-CM | POA: Diagnosis not present

## 2015-06-30 DIAGNOSIS — R3912 Poor urinary stream: Secondary | ICD-10-CM | POA: Diagnosis not present

## 2015-06-30 DIAGNOSIS — N3020 Other chronic cystitis without hematuria: Secondary | ICD-10-CM | POA: Diagnosis not present

## 2015-07-01 ENCOUNTER — Other Ambulatory Visit: Payer: Self-pay | Admitting: Urology

## 2015-07-05 DIAGNOSIS — M5126 Other intervertebral disc displacement, lumbar region: Secondary | ICD-10-CM | POA: Insufficient documentation

## 2015-07-05 DIAGNOSIS — G629 Polyneuropathy, unspecified: Secondary | ICD-10-CM | POA: Diagnosis not present

## 2015-07-05 DIAGNOSIS — M5137 Other intervertebral disc degeneration, lumbosacral region: Secondary | ICD-10-CM | POA: Diagnosis not present

## 2015-07-05 DIAGNOSIS — M797 Fibromyalgia: Secondary | ICD-10-CM | POA: Diagnosis not present

## 2015-07-05 DIAGNOSIS — M4806 Spinal stenosis, lumbar region: Secondary | ICD-10-CM | POA: Diagnosis not present

## 2015-07-05 DIAGNOSIS — R32 Unspecified urinary incontinence: Secondary | ICD-10-CM | POA: Diagnosis not present

## 2015-07-06 ENCOUNTER — Encounter (HOSPITAL_BASED_OUTPATIENT_CLINIC_OR_DEPARTMENT_OTHER): Payer: Self-pay | Admitting: *Deleted

## 2015-07-07 ENCOUNTER — Other Ambulatory Visit: Payer: Self-pay | Admitting: Neurosurgery

## 2015-07-07 DIAGNOSIS — M5137 Other intervertebral disc degeneration, lumbosacral region: Secondary | ICD-10-CM

## 2015-07-11 ENCOUNTER — Encounter (HOSPITAL_BASED_OUTPATIENT_CLINIC_OR_DEPARTMENT_OTHER): Payer: Self-pay | Admitting: *Deleted

## 2015-07-11 ENCOUNTER — Encounter: Payer: Medicare Other | Admitting: Neurology

## 2015-07-11 NOTE — H&P (Signed)
History of Present Illness   I was consulted by Dr Kelton Pillar regarding [name] who I believe I saw many years ago but I do not have any records of the visits. I placed her on Rapaflo.   She said the same problem is present but it has been getting worse over the last year. She will have a strong urge to urinate but then she cannot. She has a poor flow. She sometimes hesitates. She sometimes stops and starts. She sometimes strains but does not necessarily feel empty.   She has rare incontinence with laughing. She does not wear a pad. She gets up once a night and voids every 2-3 hours during the day.    When I saw [name] recently, I was not convinced she was getting urinary tract infections. She was having suprapubic and right and left lower quadrant pain and some right flank pain not relieved by voiding. Cystoscopy was normal. I felt she almost had stranguria and she might have interstitial cystitis. We were going to proceed with a hydrodistention and CT scan was normal. CT scan recently demonstrated no abnormalities.   Frequency: Stable.   Urinalysis: Positive bacteria. Urine sent for culture.    Past Medical History Problems  1. History of Anxiety (F41.9) 2. History of arthritis (Z87.39) 3. History of asthma (Z87.09) 4. History of cardiac arrhythmia (Z86.79) 5. History of depression (Z86.59) 6. History of esophageal reflux (Z87.19) 7. History of gastric ulcer (Z87.19) 8. History of glaucoma (Z86.69) 9. History of hypercholesterolemia (Z86.39) 10. History of hypertension (Z86.79) 11. History of sleep apnea (Z87.09)  Surgical History Problems  1. History of Cholecystectomy 2. History of Hysterectomy 3. History of Nose Surgery 4. History of Spine Repair  Current Meds 1. Calcium TABS;  Therapy: (Recorded:22Sep2016) to Recorded 2. Losartan Potassium TABS;  Therapy: (Recorded:22Sep2016) to Recorded 3. PARoxetine HCl TABS;  Therapy: (Recorded:22Sep2016)  to Recorded 4. Potassium TABS;  Therapy: (Recorded:22Sep2016) to Recorded 5. Rapaflo CAPS;  Therapy: (Recorded:22Sep2016) to Recorded 6. Tylenol CAPS;  Therapy: (Recorded:22Sep2016) to Recorded 7. Tylenol TABS;  Therapy: (Recorded:22Sep2016) to Recorded 8. Vitamin B12 TABS;  Therapy: (Recorded:22Sep2016) to Recorded 9. Womens Daily Multivitamin TABS;  Therapy: (Recorded:22Sep2016) to Recorded  Allergies Medication  1. Latex Gloves MISC  Family History Problems  1. Family history of colonic polyps (Z83.71) : Father 2. Family history of diabetes mellitus (Z83.3) : Mother, Sister, Brother 3. Family history of hypertension (Z82.49) : Mother, Sister, Brother 4. Family history of leukemia (Z80.6) : Brother 5. Family history of malignant neoplasm (Z80.9) : Mother, Father, Brother 66. Family history of obesity (Z83.49) : Mother, Father, Sister 68. Family history of prostate cancer (Z80.42) : Brother 8. Family history of sickle cell trait (Z83.2) : Father 23. Family history of tuberculosis (Z72.1) : Mother  Social History Problems  1. Denied: History of Alcohol use 2. Caffeine use (F15.90)   one or less 3. Former smoker [phone]) 4. Married 5. Number of children   1 daughter 6. Retired  Dietitian  Urine [Data Includes: Last 1 Day]   06Oct2016  COLOR YELLOW   APPEARANCE CLEAR   SPECIFIC GRAVITY 1.015   pH 6.0   GLUCOSE NEGATIVE   BILIRUBIN NEGATIVE   KETONE NEGATIVE   BLOOD NEGATIVE   PROTEIN NEGATIVE   NITRITE NEGATIVE   LEUKOCYTE ESTERASE TRACE   SQUAMOUS EPITHELIAL/HPF 0-5 HPF  WBC 0-5 WBC/HPF  RBC 0-2 RBC/HPF  BACTERIA MODERATE HPF  CRYSTALS NONE SEEN HPF  CASTS NONE SEEN LPF  Other SN   Yeast NONE SEEN HPF   Assessment Assessed  1. Abdominal pain (R10.9) 2. Weak urinary stream (R39.12)  Plan Chronic cystitis  1. Follow-up Schedule Surgery Office  Follow-up  Status: Complete  Done: 06Oct2016  Discussion/Summary   [name] continues to  have a lot of bilateral flank or back pain. She also has suprapubic discomfort. She has had 3 back surgeries. We are going to proceed with a hydrodistention and my index of suspicion is mild to modest that she could have interstitial cystitis. If the test is negative, I do not think she has it.   I am going to send a copy of my note to Dr Saintclair Halsted, her neurosurgeon, and I have asked her to make an appointment with him just for clinical reassessment. We will proceed accordingly.  After a thorough review of the management options for the patient's condition the patient  elected to proceed with surgical therapy as noted above. We have discussed the potential benefits and risks of the procedure, side effects of the proposed treatment, the likelihood of the patient achieving the goals of the procedure, and any potential problems that might occur during the procedure or recuperation. Informed consent has been obtained.

## 2015-07-11 NOTE — Progress Notes (Signed)
To [facility] [place] on arrival-instructed Npo after Mn-will take nifedipine with small amt water in am.

## 2015-07-12 ENCOUNTER — Ambulatory Visit (HOSPITAL_BASED_OUTPATIENT_CLINIC_OR_DEPARTMENT_OTHER): Payer: Medicare Other | Admitting: Anesthesiology

## 2015-07-12 ENCOUNTER — Encounter (HOSPITAL_BASED_OUTPATIENT_CLINIC_OR_DEPARTMENT_OTHER)
Admission: RE | Disposition: A | Discharge status: Home / Self Care | Payer: Self-pay | Source: Ambulatory Visit | Attending: Urology

## 2015-07-12 ENCOUNTER — Ambulatory Visit (HOSPITAL_BASED_OUTPATIENT_CLINIC_OR_DEPARTMENT_OTHER)
Admission: RE | Admit: 2015-07-12 | Discharge: 2015-07-12 | Disposition: A | Discharge status: Home / Self Care | Payer: Medicare Other | Source: Ambulatory Visit | Attending: Urology | Admitting: Urology

## 2015-07-12 ENCOUNTER — Encounter (HOSPITAL_BASED_OUTPATIENT_CLINIC_OR_DEPARTMENT_OTHER): Payer: Self-pay | Admitting: Anesthesiology

## 2015-07-12 DIAGNOSIS — E7800 Pure hypercholesterolemia, unspecified: Secondary | ICD-10-CM | POA: Diagnosis not present

## 2015-07-12 DIAGNOSIS — G4730 Sleep apnea, unspecified: Secondary | ICD-10-CM | POA: Insufficient documentation

## 2015-07-12 DIAGNOSIS — Z79899 Other long term (current) drug therapy: Secondary | ICD-10-CM | POA: Diagnosis not present

## 2015-07-12 DIAGNOSIS — N3010 Interstitial cystitis (chronic) without hematuria: Secondary | ICD-10-CM | POA: Diagnosis not present

## 2015-07-12 DIAGNOSIS — I10 Essential (primary) hypertension: Secondary | ICD-10-CM | POA: Insufficient documentation

## 2015-07-12 DIAGNOSIS — G4733 Obstructive sleep apnea (adult) (pediatric): Secondary | ICD-10-CM | POA: Diagnosis not present

## 2015-07-12 DIAGNOSIS — M1990 Unspecified osteoarthritis, unspecified site: Secondary | ICD-10-CM | POA: Diagnosis not present

## 2015-07-12 DIAGNOSIS — F329 Major depressive disorder, single episode, unspecified: Secondary | ICD-10-CM | POA: Diagnosis not present

## 2015-07-12 DIAGNOSIS — M797 Fibromyalgia: Secondary | ICD-10-CM | POA: Diagnosis not present

## 2015-07-12 DIAGNOSIS — K219 Gastro-esophageal reflux disease without esophagitis: Secondary | ICD-10-CM | POA: Diagnosis not present

## 2015-07-12 DIAGNOSIS — R3912 Poor urinary stream: Secondary | ICD-10-CM | POA: Insufficient documentation

## 2015-07-12 DIAGNOSIS — H409 Unspecified glaucoma: Secondary | ICD-10-CM | POA: Insufficient documentation

## 2015-07-12 DIAGNOSIS — J45909 Unspecified asthma, uncomplicated: Secondary | ICD-10-CM | POA: Diagnosis not present

## 2015-07-12 DIAGNOSIS — Z87891 Personal history of nicotine dependence: Secondary | ICD-10-CM | POA: Insufficient documentation

## 2015-07-12 DIAGNOSIS — F419 Anxiety disorder, unspecified: Secondary | ICD-10-CM | POA: Diagnosis not present

## 2015-07-12 DIAGNOSIS — R102 Pelvic and perineal pain: Secondary | ICD-10-CM | POA: Diagnosis not present

## 2015-07-12 DIAGNOSIS — R109 Unspecified abdominal pain: Secondary | ICD-10-CM | POA: Diagnosis present

## 2015-07-12 HISTORY — PX: CYSTO WITH HYDRODISTENSION: SHX5453

## 2015-07-12 HISTORY — DX: Shortness of breath: R06.02

## 2015-07-12 HISTORY — DX: Pelvic and perineal pain: R10.2

## 2015-07-12 HISTORY — DX: Personal history of other venous thrombosis and embolism: Z86.718

## 2015-07-12 HISTORY — DX: Obstructive sleep apnea (adult) (pediatric): G47.33

## 2015-07-12 LAB — POCT I-STAT 4, (NA,K, GLUC, HGB,HCT)
Glucose, Bld: 116 mg/dL — ABNORMAL HIGH (ref 65–99)
HCT: 43 % (ref 36.0–46.0)
Hemoglobin: 14.6 g/dL (ref 12.0–15.0)
Potassium: 3 mmol/L — ABNORMAL LOW (ref 3.5–5.1)
Sodium: 140 mmol/L (ref 135–145)

## 2015-07-12 SURGERY — CYSTOSCOPY, WITH BLADDER HYDRODISTENSION
Anesthesia: General | Site: Bladder

## 2015-07-12 MED ORDER — CIPROFLOXACIN HCL 500 MG PO TABS: 500.0000 mg | ORAL_TABLET | Freq: Two times a day (BID) | ORAL | Status: DC

## 2015-07-12 MED ORDER — PHENAZOPYRIDINE HCL 200 MG PO TABS
ORAL | Status: DC | PRN
  Administered 2015-07-12: 15 mL via INTRAVESICAL

## 2015-07-12 MED ORDER — LACTATED RINGERS IV SOLN
INTRAVENOUS | Status: DC
  Administered 2015-07-12 (×2): via INTRAVENOUS
  Filled 2015-07-12: qty 1000

## 2015-07-12 MED ORDER — STERILE WATER FOR IRRIGATION IR SOLN
Status: DC | PRN
  Administered 2015-07-12: 3000 mL

## 2015-07-12 MED ORDER — ONDANSETRON HCL 4 MG/2ML IJ SOLN
INTRAMUSCULAR | Status: DC | PRN
  Administered 2015-07-12: 4 mg via INTRAVENOUS

## 2015-07-12 MED ORDER — HYDROCODONE-ACETAMINOPHEN 5-325 MG PO TABS: 1.0000 | ORAL_TABLET | Freq: Four times a day (QID) | ORAL | Status: DC | PRN

## 2015-07-12 MED ORDER — MIDAZOLAM HCL 5 MG/5ML IJ SOLN
INTRAMUSCULAR | Status: DC | PRN
  Administered 2015-07-12 (×2): 1 mg via INTRAVENOUS

## 2015-07-12 MED ORDER — LIDOCAINE HCL (CARDIAC) 20 MG/ML IV SOLN
INTRAVENOUS | Status: DC | PRN
  Administered 2015-07-12: 60 mg via INTRAVENOUS

## 2015-07-12 MED ORDER — FENTANYL CITRATE (PF) 100 MCG/2ML IJ SOLN
INTRAMUSCULAR | Status: AC
  Filled 2015-07-12: qty 2

## 2015-07-12 MED ORDER — PROMETHAZINE HCL 25 MG/ML IJ SOLN
6.2500 mg | INTRAMUSCULAR | Status: DC | PRN
  Filled 2015-07-12: qty 1

## 2015-07-12 MED ORDER — CIPROFLOXACIN IN D5W 400 MG/200ML IV SOLN
400.0000 mg | INTRAVENOUS | Status: AC
  Administered 2015-07-12: 400 mg via INTRAVENOUS
  Filled 2015-07-12: qty 200

## 2015-07-12 MED ORDER — HYDROMORPHONE HCL 1 MG/ML IJ SOLN
0.2500 mg | INTRAMUSCULAR | Status: DC | PRN
  Filled 2015-07-12: qty 1

## 2015-07-12 MED ORDER — DEXAMETHASONE SODIUM PHOSPHATE 10 MG/ML IJ SOLN
INTRAMUSCULAR | Status: DC | PRN
  Administered 2015-07-12: 10 mg via INTRAVENOUS

## 2015-07-12 MED ORDER — FENTANYL CITRATE (PF) 100 MCG/2ML IJ SOLN
INTRAMUSCULAR | Status: DC | PRN
  Administered 2015-07-12: 50 ug via INTRAVENOUS
  Administered 2015-07-12 (×2): 25 ug via INTRAVENOUS

## 2015-07-12 MED ORDER — MIDAZOLAM HCL 2 MG/2ML IJ SOLN
INTRAMUSCULAR | Status: AC
  Filled 2015-07-12: qty 2

## 2015-07-12 MED ORDER — PROPOFOL 10 MG/ML IV BOLUS
INTRAVENOUS | Status: DC | PRN
  Administered 2015-07-12: 180 mg via INTRAVENOUS

## 2015-07-12 MED ORDER — ACETAMINOPHEN 10 MG/ML IV SOLN
INTRAVENOUS | Status: DC | PRN
  Administered 2015-07-12: 1000 mg via INTRAVENOUS

## 2015-07-12 MED ORDER — CIPROFLOXACIN IN D5W 400 MG/200ML IV SOLN
INTRAVENOUS | Status: AC
  Filled 2015-07-12: qty 200

## 2015-07-12 MED ORDER — KETOROLAC TROMETHAMINE 30 MG/ML IJ SOLN
INTRAMUSCULAR | Status: DC | PRN
  Administered 2015-07-12: 30 mg via INTRAVENOUS

## 2015-07-12 SURGICAL SUPPLY — 24 items
BAG DRAIN URO-CYSTO SKYTR STRL (DRAIN) ×2 IMPLANT
BAG DRN UROCATH (DRAIN) ×1
CATH FOLEY 2WAY SLVR  5CC 18FR (CATHETERS)
CATH FOLEY 2WAY SLVR 5CC 18FR (CATHETERS) IMPLANT
CATH ROBINSON RED A/P 12FR (CATHETERS) IMPLANT
CATH ROBINSON RED A/P 14FR (CATHETERS) IMPLANT
CLOTH BEACON ORANGE TIMEOUT ST (SAFETY) ×2 IMPLANT
ELECT REM PT RETURN 9FT ADLT (ELECTROSURGICAL)
ELECTRODE REM PT RTRN 9FT ADLT (ELECTROSURGICAL) IMPLANT
GLOVE BIO SURGEON STRL SZ 6.5 (GLOVE) ×2 IMPLANT
GLOVE BIO SURGEON STRL SZ7.5 (GLOVE) ×2 IMPLANT
GLOVE BIOGEL PI IND STRL 6.5 (GLOVE) IMPLANT
GLOVE BIOGEL PI INDICATOR 6.5 (GLOVE) ×2
GLOVE SURG SS PI 7.5 STRL IVOR (GLOVE) ×1 IMPLANT
GOWN STRL REUS W/ TWL XL LVL3 (GOWN DISPOSABLE) ×1 IMPLANT
GOWN STRL REUS W/TWL XL LVL3 (GOWN DISPOSABLE) ×6
KIT ROOM TURNOVER WOR (KITS) ×3 IMPLANT
MANIFOLD NEPTUNE II (INSTRUMENTS) ×1 IMPLANT
NDL SAFETY ECLIPSE 18X1.5 (NEEDLE) ×1 IMPLANT
NEEDLE HYPO 18GX1.5 SHARP (NEEDLE) ×2
PACK CYSTO (CUSTOM PROCEDURE TRAY) ×2 IMPLANT
SUT SILK 0 TIES 10X30 (SUTURE) IMPLANT
SYR 20CC LL (SYRINGE) ×2 IMPLANT
WATER STERILE IRR 3000ML UROMA (IV SOLUTION) ×2 IMPLANT

## 2015-07-12 NOTE — Anesthesia Postprocedure Evaluation (Signed)
  Anesthesia Post-op Note  Patient: Amy [name]  Procedure(s) Performed: Procedure(s): CYSTOSCOPY/HYDRODISTENSION (N/A)  Patient Location: PACU  Anesthesia Type:General  Level of Consciousness: awake  Airway and Oxygen Therapy: Patient Spontanous Breathing  Post-op Pain: mild  Post-op Assessment: Post-op Vital signs reviewed              Post-op Vital Signs: Reviewed  Last Vitals:  Filed Vitals:   07/12/15 0945  BP: 133/102  Pulse: 88  Temp:   Resp: 13    Complications: No apparent anesthesia complications

## 2015-07-12 NOTE — Interval H&P Note (Signed)
History and Physical Interval Note:  07/12/2015 7:53 AM  Amy [name]  has presented today for surgery, with the diagnosis of PELVICPAIN  The various methods of treatment have been discussed with the patient and family. After consideration of risks, benefits and other options for treatment, the patient has consented to  Procedure(s): CYSTOSCOPY/HYDRODISTENSION (N/A) as a surgical intervention .  The patient's history has been reviewed, patient examined, no change in status, stable for surgery.  I have reviewed the patient's chart and labs.  Questions were answered to the patient's satisfaction.     MACDIARMID,SCOTT A

## 2015-07-12 NOTE — Anesthesia Preprocedure Evaluation (Addendum)
Anesthesia Evaluation  Patient identified by MRN, date of birth, ID band Patient awake    Reviewed: Allergy & Precautions, NPO status   Airway Mallampati: II  TM Distance: >3 FB Neck ROM: Full    Dental   Pulmonary shortness of breath, sleep apnea , former smoker,    breath sounds clear to auscultation       Cardiovascular hypertension,  Rhythm:Regular Rate:Normal     Neuro/Psych    GI/Hepatic Neg liver ROS, GERD  ,  Endo/Other  negative endocrine ROS  Renal/GU negative Renal ROS     Musculoskeletal  (+) Arthritis , Fibromyalgia -  Abdominal   Peds  Hematology   Anesthesia Other Findings   Reproductive/Obstetrics                            Anesthesia Physical Anesthesia Plan  ASA: III  Anesthesia Plan: General   Post-op Pain Management:    Induction: Intravenous  Airway Management Planned: LMA  Additional Equipment:   Intra-op Plan:   Post-operative Plan: Extubation in OR  Informed Consent: I have reviewed the patients History and Physical, chart, labs and discussed the procedure including the risks, benefits and alternatives for the proposed anesthesia with the patient or authorized representative who has indicated his/her understanding and acceptance.   Dental advisory given  Plan Discussed with: CRNA and Anesthesiologist  Anesthesia Plan Comments:         Anesthesia Quick Evaluation

## 2015-07-12 NOTE — Discharge Instructions (Signed)

## 2015-07-12 NOTE — Transfer of Care (Signed)
Immediate Anesthesia Transfer of Care Note  Patient: Amy [name]  Procedure(s) Performed: Procedure(s) (LRB): CYSTOSCOPY/HYDRODISTENSION (N/A)  Patient Location: PACU  Anesthesia Type: General  Level of Consciousness: awake, sedated, patient cooperative and responds to stimulation  Airway & Oxygen Therapy: Patient Spontanous Breathing and Patient connected to face mask oxygen  Post-op Assessment: Report given to PACU RN, Post -op Vital signs reviewed and stable and Patient moving all extremities  Post vital signs: Reviewed and stable  Complications: No apparent anesthesia complications

## 2015-07-12 NOTE — Op Note (Signed)
Preoperative diagnosis: Pelvic pain Postoperative diagnosis: Interstitial cystitis Surgery: Cystoscopy and bladder hydrodistention bladder installation therapy Surgeon: Dr. Nicki Reaper MacDiarmid Assistant Dr. Nelwyn Salisbury  The patient has the above diagnoses and consented to the above procedure. Preoperative antibiotics were given. 21 [place] scope was utilized. Bladder mucosa and trigone were normal. There is no cystitis. She was hydrodistended 900 mL based on volume  Water was emptied. On reinspection she had mild glomerulations at 5 and 7:00 but in keeping with a diagnosis of interstitial cystitis. There is no bladder injury  Bladder was emptied  As a separate procedure 15 mL of 0.5% Marcaine and 400 mg a pretty was instilled in the bladder using a red rubber catheter  Although the findings were mild the findings to support a diagnosis of interstitial cystitis but the patient be educated regarding this

## 2015-07-12 NOTE — Anesthesia Procedure Notes (Signed)
Procedure Name: LMA Insertion Date/Time: 07/12/2015 9:09 AM Performed by: Justice Rocher Pre-anesthesia Checklist: Patient identified, Emergency Drugs available, Suction available and Patient being monitored Patient Re-evaluated:Patient Re-evaluated prior to inductionOxygen Delivery Method: Circle System Utilized Preoxygenation: Pre-oxygenation with 100% oxygen Intubation Type: IV induction Ventilation: Mask ventilation without difficulty LMA: LMA inserted LMA Size: 4.0 Number of attempts: 1 Airway Equipment and Method: bite block Placement Confirmation: positive ETCO2 Tube secured with: Tape Dental Injury: Teeth and Oropharynx as per pre-operative assessment

## 2015-07-13 ENCOUNTER — Encounter (HOSPITAL_BASED_OUTPATIENT_CLINIC_OR_DEPARTMENT_OTHER): Payer: Self-pay | Admitting: Urology

## 2015-07-15 NOTE — Addendum Note (Signed)
Addendum  created 07/15/15 0029 by Finis Bud, MD   Modules edited: Anesthesia Responsible Staff

## 2015-07-19 ENCOUNTER — Telehealth: Payer: Self-pay | Admitting: Neurology

## 2015-07-19 NOTE — Telephone Encounter (Signed)
Patient called to confirm NCS/EMG appointment, hasn't heard back from anyone. Appointment in process of being rescheduled by [facility]. Rescheduled appointment to 08/25/15, advised paitent to contact Dr. Rosina Lowenstein office to obtain new referral as old referral expires 11/29. Patient asked that paperwork be mailed to her ahead of time as she has trouble seeing and it will take her a while to fill out the paperwork. Patient also wanted to know if there are any instructions to prepare for this appointment.

## 2015-07-19 NOTE — Telephone Encounter (Signed)
Called pt to let her know we are mailing her a copy today about EMG/NCS. Told her to call back within the next week if she does not receive it. She verbalized understanding.

## 2015-07-21 DIAGNOSIS — E876 Hypokalemia: Secondary | ICD-10-CM | POA: Diagnosis not present

## 2015-07-25 DIAGNOSIS — N3010 Interstitial cystitis (chronic) without hematuria: Secondary | ICD-10-CM | POA: Diagnosis not present

## 2015-07-25 DIAGNOSIS — R1031 Right lower quadrant pain: Secondary | ICD-10-CM | POA: Diagnosis not present

## 2015-07-28 ENCOUNTER — Ambulatory Visit
Admission: RE | Admit: 2015-07-28 | Discharge: 2015-07-28 | Disposition: A | Discharge status: Home / Self Care | Payer: Medicare Other | Source: Ambulatory Visit | Attending: Neurosurgery | Admitting: Neurosurgery

## 2015-07-28 DIAGNOSIS — M5126 Other intervertebral disc displacement, lumbar region: Secondary | ICD-10-CM | POA: Diagnosis not present

## 2015-07-28 DIAGNOSIS — M5137 Other intervertebral disc degeneration, lumbosacral region: Secondary | ICD-10-CM

## 2015-07-28 MED ORDER — GADOBENATE DIMEGLUMINE 529 MG/ML IV SOLN: 20.0000 mL | Freq: Once | INTRAVENOUS | Status: DC | PRN

## 2015-08-02 DIAGNOSIS — M4806 Spinal stenosis, lumbar region: Secondary | ICD-10-CM | POA: Diagnosis not present

## 2015-08-02 DIAGNOSIS — M5137 Other intervertebral disc degeneration, lumbosacral region: Secondary | ICD-10-CM | POA: Diagnosis not present

## 2015-08-04 ENCOUNTER — Encounter: Payer: Medicare Other | Admitting: Neurology

## 2015-08-05 ENCOUNTER — Other Ambulatory Visit (HOSPITAL_COMMUNITY): Payer: Self-pay | Admitting: Neurosurgery

## 2015-08-16 DIAGNOSIS — N760 Acute vaginitis: Secondary | ICD-10-CM | POA: Diagnosis not present

## 2015-08-16 DIAGNOSIS — N3010 Interstitial cystitis (chronic) without hematuria: Secondary | ICD-10-CM | POA: Diagnosis not present

## 2015-08-16 DIAGNOSIS — M5126 Other intervertebral disc displacement, lumbar region: Secondary | ICD-10-CM | POA: Diagnosis not present

## 2015-08-17 ENCOUNTER — Encounter: Payer: Self-pay | Admitting: Neurology

## 2015-08-25 ENCOUNTER — Encounter: Payer: Medicare Other | Admitting: Neurology

## 2015-08-30 ENCOUNTER — Ambulatory Visit (INDEPENDENT_AMBULATORY_CARE_PROVIDER_SITE_OTHER): Payer: Self-pay | Admitting: Neurology

## 2015-08-30 ENCOUNTER — Ambulatory Visit (INDEPENDENT_AMBULATORY_CARE_PROVIDER_SITE_OTHER): Payer: Medicare Other | Admitting: Neurology

## 2015-08-30 DIAGNOSIS — M5416 Radiculopathy, lumbar region: Secondary | ICD-10-CM

## 2015-08-30 DIAGNOSIS — R202 Paresthesia of skin: Secondary | ICD-10-CM

## 2015-08-30 DIAGNOSIS — Z0289 Encounter for other administrative examinations: Secondary | ICD-10-CM

## 2015-08-30 NOTE — Progress Notes (Signed)
  [id number]    Provider:  Dr Jaynee Eagles Referring Provider: Kelton Pillar, MD Primary Care Physician:  Osborne Casco, MD, Kary Kos MD, Francee Piccolo MD  HPI:  Amy [name] is a 69 y.o. female here as a referral from Dr. Laurann Montana  She endorses numbness and tingling in the feet for the last 3 years. She was evaluated by podiatry. She gets shocks in the feet and legs. Symptoms are in both the feet and symmetric. She has cramping in the toes and feet. She gets "charlie horses". She has chronic back pain s/p surgery and is going for a new surgery and endorses radiculopathy.   Summary  Nerve conduction studies were performed on the bilateral lower extremities:  The bilateral Peroneal motor nerves showed normal conductions with normal F Wave latencies The bilateral Tibial motor nerves showed normal conductions with normal F Wave latencies The bilateral Sural sensory nerves were within normal limits Bilateral H Reflexes showed normal latencies  EMG Needle study was performed on selected right lower extremity muscles:   The right Vastus Medialis was within normal limits. Attempted the right Anterior Tibialis but EMG needle exam was terminated prematurely at patient's request due to pain.   Conclusion: Nerve conduction studies were within normal limits. EMG needle exam was terminated prematurely at patient's request due to pain.   Sharol Given, MD   Sarina Ill, MD  [facility] [address] [place], [place] [address]  Phone [phone] Fax [phone]

## 2015-08-31 DIAGNOSIS — B351 Tinea unguium: Secondary | ICD-10-CM | POA: Diagnosis not present

## 2015-08-31 DIAGNOSIS — L602 Onychogryphosis: Secondary | ICD-10-CM | POA: Diagnosis not present

## 2015-08-31 DIAGNOSIS — G64 Other disorders of peripheral nervous system: Secondary | ICD-10-CM | POA: Diagnosis not present

## 2015-08-31 NOTE — Procedures (Signed)
  [id number]    Provider:  Dr Jaynee [name] Referring Provider: Kelton Pillar, MD Primary Care Physician:  Amy Casco, MD, Amy Kos MD, Amy Piccolo MD  HPI:  Amy [name] is a 69 y.o. female here as a referral from Amy. [name]  She endorses numbness and tingling in the feet for the last 3 years. She was evaluated by podiatry. She gets shocks in the feet and legs. Symptoms are in both the feet and symmetric. She has cramping in the toes and feet. She gets "charlie horses". She has chronic back pain s/p surgery and is going for a new surgery and endorses radiculopathy.   Summary  Nerve conduction studies were performed on the bilateral lower extremities:  The bilateral Peroneal motor nerves showed normal conductions with normal F Wave latencies The bilateral Tibial motor nerves showed normal conductions with normal F Wave latencies The bilateral Sural sensory nerves were within normal limits Bilateral H Reflexes showed normal latencies  EMG Needle study was performed on selected right lower extremity muscles:   The right Vastus Medialis was within normal limits. Attempted the right Anterior Tibialis but EMG needle exam was terminated prematurely at patient's request due to pain.   Conclusion: Nerve conduction studies were within normal limits. EMG needle exam was terminated prematurely at patient's request due to pain.   Amy Given, MD   Amy Ill, MD  [facility] [address] [place], [place] [address]  Phone [phone] Fax [phone]

## 2015-08-31 NOTE — Progress Notes (Signed)
See procedure note.

## 2015-09-15 ENCOUNTER — Other Ambulatory Visit: Payer: Self-pay

## 2015-09-15 DIAGNOSIS — Z1231 Encounter for screening mammogram for malignant neoplasm of breast: Secondary | ICD-10-CM

## 2015-09-20 NOTE — Pre-Procedure Instructions (Signed)
Amy [name]  09/20/2015     Your procedure is scheduled on Wednesday, September 28, 2015 at 8:30 AM.   Report to [facility] Entrance "A" Admitting Office at 6:30 AM.   Call this number if you have problems the morning of surgery: [phone]   Any questions prior to day of surgery, please call [phone] between 8 & 4 PM.   Remember:  Do not eat food or drink liquids after midnight Tuesday, 09/27/15.  Take these medicines the morning of surgery with A SIP OF WATER: Glycopyrrlate (Robinul), Pantoprazole (Protonix), Paroxetine (Paxil), Hydrocodone - if needed  Stop Herbal medications, Multivitamins and Vitamin E as of today. Do not use any Aspirin products prior to surgery.   Do not wear jewelry, make-up or nail polish.  Do not wear lotions, powders, or perfumes.  You may wear deodorant.  Do not shave 48 hours prior to surgery.    Do not bring valuables to the hospital.  [facility] is not responsible for any belongings or valuables.  Contacts, dentures or bridgework may not be worn into surgery.  Leave your suitcase in the car.  After surgery it may be brought to your room.  For patients admitted to the hospital, discharge time will be determined by your treatment team.  Special instructions:  [place] - Preparing for Surgery  Before surgery, you can play an important role.  Because skin is not sterile, your skin needs to be as free of germs as possible.  You can reduce the number of germs on you skin by washing with CHG (chlorahexidine gluconate) soap before surgery.  CHG is an antiseptic cleaner which kills germs and bonds with the skin to continue killing germs even after washing.  Please DO NOT use if you have an allergy to CHG or antibacterial soaps.  If your skin becomes reddened/irritated stop using the CHG and inform your nurse when you arrive at Short Stay.  Do not shave (including legs and underarms) for at least 48 hours prior to the first CHG  shower.  You may shave your face.  Please follow these instructions carefully:   1.  Shower with CHG Soap the night before surgery and the                                morning of Surgery.  2.  If you choose to wash your hair, wash your hair first as usual with your       normal shampoo.  3.  After you shampoo, rinse your hair and body thoroughly to remove the                      Shampoo.  4.  Use CHG as you would any other liquid soap.  You can apply chg directly       to the skin and wash gently with scrungie or a clean washcloth.  5.  Apply the CHG Soap to your body ONLY FROM THE NECK DOWN.        Do not use on open wounds or open sores.  Avoid contact with your eyes, ears, mouth and genitals (private parts).  Wash genitals (private parts) with your normal soap.  6.  Wash thoroughly, paying special attention to the area where your surgery        will be performed.  7.  Thoroughly rinse your body with warm water  from the neck down.  8.  DO NOT shower/wash with your normal soap after using and rinsing off       the CHG Soap.  9.  Pat yourself dry with a clean towel.            10.  Wear clean pajamas.            11.  Place clean sheets on your bed the night of your first shower and do not        sleep with pets.  Day of Surgery  Do not apply any lotions the morning of surgery.  Please wear clean clothes to the hospital.   Please read over the following fact sheets that you were given. Pain Booklet, Coughing and Deep Breathing, Blood Transfusion Information, MRSA Information and Surgical Site Infection Prevention

## 2015-09-20 NOTE — Pre-Procedure Instructions (Signed)
    Amy [name]  09/20/2015      [facility] [name], [place] AT [place] [address] Phone: [phone] Fax: [phone]    Your procedure is scheduled on Wednesday, January 4.  Report to [facility] Admitting at 6:30A.M.               Your surgery or procedure is scheduled for 8:30 AM   Call this number if you have problems the morning of [phone]                 For any other questions, please call [phone], Monday - Friday 8 AM - 4 PM.   Remember:  Do not eat food or drink liquids after midnight Tuesday, January 3.  Take these medicines the morning of surgery with A SIP OF WATER :pantoprazole (PROTONIX), PARoxetine (PAXIL).                    Take if needed: Tylenol.                    STOP taking Aspirin, Aspirin Products, Herbal products and Vitamins.   Do not wear jewelry, make-up or nail polish.  Do not wear lotions, powders, or perfumes.   Do not shave 48 hours prior to surgery.    Do not bring valuables to the hospital.  [facility] is not responsible for any belongings or valuables.  Contacts, dentures or bridgework may not be worn into surgery.  Leave your suitcase in the car.  After surgery it may be brought to your room.  For patients admitted to the hospital, discharge time will be determined by your treatment team.  Patients discharged the day of surgery will not be allowed to drive home.   Name and phone number of your driver:  -  Special instructions:  Review  [place] - Preparing For Surgery.  Please read over the following fact sheets that you were given. Pain Booklet, Coughing and Deep Breathing, Blood Transfusion Information and Surgical Site Infection Prevention

## 2015-09-21 ENCOUNTER — Encounter (HOSPITAL_COMMUNITY): Payer: Self-pay

## 2015-09-21 ENCOUNTER — Encounter (HOSPITAL_COMMUNITY)
Admission: RE | Admit: 2015-09-21 | Discharge: 2015-09-21 | Disposition: A | Discharge status: Home / Self Care | Payer: Medicare Other | Source: Ambulatory Visit | Attending: Neurosurgery | Admitting: Neurosurgery

## 2015-09-21 DIAGNOSIS — Z01812 Encounter for preprocedural laboratory examination: Secondary | ICD-10-CM | POA: Diagnosis not present

## 2015-09-21 DIAGNOSIS — Z0183 Encounter for blood typing: Secondary | ICD-10-CM | POA: Diagnosis not present

## 2015-09-21 DIAGNOSIS — Z981 Arthrodesis status: Secondary | ICD-10-CM | POA: Insufficient documentation

## 2015-09-21 HISTORY — DX: Cardiac arrhythmia, unspecified: I49.9

## 2015-09-21 HISTORY — DX: Headache, unspecified: R51.9

## 2015-09-21 HISTORY — DX: Iron deficiency: E61.1

## 2015-09-21 HISTORY — DX: Peripheral vascular disease, unspecified: I73.9

## 2015-09-21 HISTORY — DX: Polyneuropathy, unspecified: G62.9

## 2015-09-21 HISTORY — DX: Personal history of other diseases of the digestive system: Z87.19

## 2015-09-21 HISTORY — DX: Headache: R51

## 2015-09-21 LAB — TYPE AND SCREEN
ABO/RH(D): B POS
Antibody Screen: NEGATIVE

## 2015-09-21 LAB — SURGICAL PCR SCREEN
MRSA, PCR: NEGATIVE
Staphylococcus aureus: POSITIVE — AB

## 2015-09-21 LAB — CBC
HCT: 44.4 % (ref 36.0–46.0)
Hemoglobin: 14.8 g/dL (ref 12.0–15.0)
MCH: 30.3 pg (ref 26.0–34.0)
MCHC: 33.3 g/dL (ref 30.0–36.0)
MCV: 91 fL (ref 78.0–100.0)
Platelets: 276 10*3/uL (ref 150–400)
RBC: 4.88 MIL/uL (ref 3.87–5.11)
RDW: 15 % (ref 11.5–15.5)
WBC: 2.9 10*3/uL — ABNORMAL LOW (ref 4.0–10.5)

## 2015-09-21 LAB — BASIC METABOLIC PANEL
Anion gap: 7 (ref 5–15)
BUN: 12 mg/dL (ref 6–20)
CO2: 29 mmol/L (ref 22–32)
Calcium: 9.4 mg/dL (ref 8.9–10.3)
Chloride: 103 mmol/L (ref 101–111)
Creatinine, Ser: 1.04 mg/dL — ABNORMAL HIGH (ref 0.44–1.00)
GFR calc Af Amer: >60 mL/min (ref >60)
GFR calc non Af Amer: 54 mL/min — ABNORMAL LOW (ref >60)
Glucose, Bld: 113 mg/dL — ABNORMAL HIGH (ref 65–99)
Potassium: 4.2 mmol/L (ref 3.5–5.1)
Sodium: 139 mmol/L (ref 135–145)

## 2015-09-21 NOTE — Progress Notes (Signed)
Mupirocin Ointment Rx called into [facility] on [address] in [place] for positive PCR of Staph. Left message on pt's voicemail informing her of results and need to pick up Rx and start using it.

## 2015-09-21 NOTE — Progress Notes (Signed)
Pt states the only heart history she has is an irregular heart rate "years ago". Denies chest pain or sob. Pt states she gets "off balance" when walking.   States she has "mild" sleep apnea, tried using a cpap at one time but could not tolerate it.

## 2015-09-25 HISTORY — PX: CATARACT EXTRACTION: SUR2

## 2015-09-27 MED ORDER — DEXAMETHASONE SODIUM PHOSPHATE 10 MG/ML IJ SOLN
10.0000 mg | INTRAMUSCULAR | Status: AC
  Administered 2015-09-28: 10 mg via INTRAVENOUS
  Filled 2015-09-27: qty 1

## 2015-09-27 MED ORDER — SODIUM CHLORIDE 0.9 % IV SOLN
1500.0000 mg | INTRAVENOUS | Status: AC
  Administered 2015-09-28: 1500 mg via INTRAVENOUS
  Filled 2015-09-27: qty 1500

## 2015-09-28 ENCOUNTER — Inpatient Hospital Stay (HOSPITAL_COMMUNITY): Payer: Medicare Other

## 2015-09-28 ENCOUNTER — Inpatient Hospital Stay (HOSPITAL_COMMUNITY): Payer: Medicare Other | Admitting: Certified Registered Nurse Anesthetist

## 2015-09-28 ENCOUNTER — Encounter (HOSPITAL_COMMUNITY)
Admission: RE | Disposition: A | Discharge status: Skilled Nursing Facility | Payer: Medicare Other | Source: Ambulatory Visit | Attending: Neurosurgery

## 2015-09-28 ENCOUNTER — Inpatient Hospital Stay (HOSPITAL_COMMUNITY)
Admission: RE | Admit: 2015-09-28 | Discharge: 2015-10-03 | DRG: 460 | Disposition: A | Discharge status: Skilled Nursing Facility | Payer: Medicare Other | Source: Ambulatory Visit | Attending: Neurosurgery | Admitting: Neurosurgery

## 2015-09-28 ENCOUNTER — Encounter (HOSPITAL_COMMUNITY): Payer: Self-pay | Admitting: *Deleted

## 2015-09-28 DIAGNOSIS — K219 Gastro-esophageal reflux disease without esophagitis: Secondary | ICD-10-CM | POA: Diagnosis present

## 2015-09-28 DIAGNOSIS — M47816 Spondylosis without myelopathy or radiculopathy, lumbar region: Secondary | ICD-10-CM | POA: Diagnosis present

## 2015-09-28 DIAGNOSIS — Z79899 Other long term (current) drug therapy: Secondary | ICD-10-CM | POA: Diagnosis not present

## 2015-09-28 DIAGNOSIS — Z91040 Latex allergy status: Secondary | ICD-10-CM

## 2015-09-28 DIAGNOSIS — G8929 Other chronic pain: Secondary | ICD-10-CM | POA: Diagnosis present

## 2015-09-28 DIAGNOSIS — M797 Fibromyalgia: Secondary | ICD-10-CM | POA: Diagnosis present

## 2015-09-28 DIAGNOSIS — F329 Major depressive disorder, single episode, unspecified: Secondary | ICD-10-CM | POA: Diagnosis present

## 2015-09-28 DIAGNOSIS — Z87891 Personal history of nicotine dependence: Secondary | ICD-10-CM | POA: Diagnosis not present

## 2015-09-28 DIAGNOSIS — Z86718 Personal history of other venous thrombosis and embolism: Secondary | ICD-10-CM | POA: Diagnosis not present

## 2015-09-28 DIAGNOSIS — F419 Anxiety disorder, unspecified: Secondary | ICD-10-CM | POA: Diagnosis present

## 2015-09-28 DIAGNOSIS — M5136 Other intervertebral disc degeneration, lumbar region: Secondary | ICD-10-CM | POA: Diagnosis not present

## 2015-09-28 DIAGNOSIS — M4806 Spinal stenosis, lumbar region: Principal | ICD-10-CM | POA: Diagnosis present

## 2015-09-28 DIAGNOSIS — Z90710 Acquired absence of both cervix and uterus: Secondary | ICD-10-CM | POA: Diagnosis not present

## 2015-09-28 DIAGNOSIS — Z961 Presence of intraocular lens: Secondary | ICD-10-CM | POA: Diagnosis present

## 2015-09-28 DIAGNOSIS — R490 Dysphonia: Secondary | ICD-10-CM | POA: Diagnosis not present

## 2015-09-28 DIAGNOSIS — I10 Essential (primary) hypertension: Secondary | ICD-10-CM | POA: Diagnosis not present

## 2015-09-28 DIAGNOSIS — M6281 Muscle weakness (generalized): Secondary | ICD-10-CM | POA: Diagnosis not present

## 2015-09-28 DIAGNOSIS — Z9841 Cataract extraction status, right eye: Secondary | ICD-10-CM

## 2015-09-28 DIAGNOSIS — Z8614 Personal history of Methicillin resistant Staphylococcus aureus infection: Secondary | ICD-10-CM

## 2015-09-28 DIAGNOSIS — Z880 Allergy status to penicillin: Secondary | ICD-10-CM

## 2015-09-28 DIAGNOSIS — M549 Dorsalgia, unspecified: Secondary | ICD-10-CM | POA: Diagnosis not present

## 2015-09-28 DIAGNOSIS — I739 Peripheral vascular disease, unspecified: Secondary | ICD-10-CM | POA: Diagnosis present

## 2015-09-28 DIAGNOSIS — R262 Difficulty in walking, not elsewhere classified: Secondary | ICD-10-CM | POA: Diagnosis not present

## 2015-09-28 DIAGNOSIS — R278 Other lack of coordination: Secondary | ICD-10-CM | POA: Diagnosis not present

## 2015-09-28 DIAGNOSIS — G629 Polyneuropathy, unspecified: Secondary | ICD-10-CM | POA: Diagnosis present

## 2015-09-28 DIAGNOSIS — Z4889 Encounter for other specified surgical aftercare: Secondary | ICD-10-CM | POA: Diagnosis not present

## 2015-09-28 DIAGNOSIS — R2681 Unsteadiness on feet: Secondary | ICD-10-CM | POA: Diagnosis not present

## 2015-09-28 DIAGNOSIS — R1314 Dysphagia, pharyngoesophageal phase: Secondary | ICD-10-CM | POA: Diagnosis not present

## 2015-09-28 DIAGNOSIS — Z9842 Cataract extraction status, left eye: Secondary | ICD-10-CM

## 2015-09-28 DIAGNOSIS — M48061 Spinal stenosis, lumbar region without neurogenic claudication: Secondary | ICD-10-CM | POA: Diagnosis present

## 2015-09-28 DIAGNOSIS — Z419 Encounter for procedure for purposes other than remedying health state, unspecified: Secondary | ICD-10-CM

## 2015-09-28 DIAGNOSIS — M4326 Fusion of spine, lumbar region: Secondary | ICD-10-CM | POA: Diagnosis not present

## 2015-09-28 SURGERY — POSTERIOR LUMBAR FUSION 1 WITH HARDWARE REMOVAL
Anesthesia: General | Site: Back

## 2015-09-28 MED ORDER — FENTANYL CITRATE (PF) 250 MCG/5ML IJ SOLN
INTRAMUSCULAR | Status: AC
  Filled 2015-09-28: qty 5

## 2015-09-28 MED ORDER — ALBUMIN HUMAN 5 % IV SOLN
INTRAVENOUS | Status: DC | PRN
  Administered 2015-09-28: 12:00:00 via INTRAVENOUS

## 2015-09-28 MED ORDER — VANCOMYCIN HCL 1000 MG IV SOLR
INTRAVENOUS | Status: AC
  Filled 2015-09-28: qty 1000

## 2015-09-28 MED ORDER — PHENYLEPHRINE HCL 10 MG/ML IJ SOLN
10.0000 mg | INTRAVENOUS | Status: DC | PRN
  Administered 2015-09-28: 15 ug/min via INTRAVENOUS

## 2015-09-28 MED ORDER — PROPOFOL 10 MG/ML IV BOLUS
INTRAVENOUS | Status: AC
  Filled 2015-09-28: qty 20

## 2015-09-28 MED ORDER — B COMPLEX-C PO TABS
1.0000 | ORAL_TABLET | Freq: Every day | ORAL | Status: DC
  Administered 2015-09-29 – 2015-10-03 (×5): 1 via ORAL
  Filled 2015-09-28 (×5): qty 1

## 2015-09-28 MED ORDER — 0.9 % SODIUM CHLORIDE (POUR BTL) OPTIME
TOPICAL | Status: DC | PRN
  Administered 2015-09-28: 1000 mL

## 2015-09-28 MED ORDER — GLYCOPYRROLATE 1 MG PO TABS
2.0000 mg | ORAL_TABLET | Freq: Two times a day (BID) | ORAL | Status: DC
  Administered 2015-09-28 – 2015-10-03 (×10): 2 mg via ORAL
  Filled 2015-09-28 (×12): qty 2

## 2015-09-28 MED ORDER — TAMSULOSIN HCL 0.4 MG PO CAPS
0.4000 mg | ORAL_CAPSULE | Freq: Every day | ORAL | Status: DC
  Administered 2015-09-28 – 2015-10-03 (×6): 0.4 mg via ORAL
  Filled 2015-09-28 (×6): qty 1

## 2015-09-28 MED ORDER — CALCIUM CITRATE-VITAMIN D 315-200 MG-UNIT PO TABS: 1.0000 | ORAL_TABLET | Freq: Every day | ORAL | Status: DC

## 2015-09-28 MED ORDER — DIPHENHYDRAMINE HCL 25 MG PO CAPS
25.0000 mg | ORAL_CAPSULE | Freq: Two times a day (BID) | ORAL | Status: DC | PRN
  Administered 2015-10-01: 25 mg via ORAL
  Filled 2015-09-28: qty 1

## 2015-09-28 MED ORDER — MIDAZOLAM HCL 2 MG/2ML IJ SOLN: 0.5000 mg | Freq: Once | INTRAMUSCULAR | Status: DC | PRN

## 2015-09-28 MED ORDER — ONDANSETRON HCL 4 MG/2ML IJ SOLN
INTRAMUSCULAR | Status: DC | PRN
  Administered 2015-09-28: 4 mg via INTRAVENOUS

## 2015-09-28 MED ORDER — BUPIVACAINE LIPOSOME 1.3 % IJ SUSP
20.0000 mL | Freq: Once | INTRAMUSCULAR | Status: DC
Start: 2015-09-28 — End: 2015-10-03
  Filled 2015-09-28: qty 20

## 2015-09-28 MED ORDER — LOSARTAN POTASSIUM 50 MG PO TABS
100.0000 mg | ORAL_TABLET | Freq: Every day | ORAL | Status: DC
  Administered 2015-09-28 – 2015-10-03 (×6): 100 mg via ORAL
  Filled 2015-09-28 (×6): qty 2

## 2015-09-28 MED ORDER — HYDROMORPHONE HCL 1 MG/ML IJ SOLN
0.5000 mg | INTRAMUSCULAR | Status: DC | PRN
  Administered 2015-09-28 – 2015-10-02 (×4): 1 mg via INTRAVENOUS
  Filled 2015-09-28 (×4): qty 1

## 2015-09-28 MED ORDER — ONDANSETRON HCL 4 MG/2ML IJ SOLN: 4.0000 mg | INTRAMUSCULAR | Status: DC | PRN

## 2015-09-28 MED ORDER — ROCURONIUM BROMIDE 100 MG/10ML IV SOLN
INTRAVENOUS | Status: DC | PRN
  Administered 2015-09-28: 10 mg via INTRAVENOUS
  Administered 2015-09-28: 50 mg via INTRAVENOUS
  Administered 2015-09-28 (×2): 10 mg via INTRAVENOUS
  Administered 2015-09-28: 20 mg via INTRAVENOUS
  Administered 2015-09-28 (×2): 10 mg via INTRAVENOUS

## 2015-09-28 MED ORDER — LIDOCAINE-EPINEPHRINE 1 %-1:100000 IJ SOLN
INTRAMUSCULAR | Status: DC | PRN
  Administered 2015-09-28: 9 mL

## 2015-09-28 MED ORDER — SUGAMMADEX SODIUM 200 MG/2ML IV SOLN
INTRAVENOUS | Status: AC
  Filled 2015-09-28: qty 2

## 2015-09-28 MED ORDER — ESTRADIOL 0.1 MG/GM VA CREA
1.0000 | TOPICAL_CREAM | VAGINAL | Status: DC
  Administered 2015-09-29 – 2015-10-03 (×2): 1 via VAGINAL
  Filled 2015-09-28: qty 42.5

## 2015-09-28 MED ORDER — BUPIVACAINE LIPOSOME 1.3 % IJ SUSP
INTRAMUSCULAR | Status: DC | PRN
  Administered 2015-09-28: 20 mL

## 2015-09-28 MED ORDER — HYDROCODONE-ACETAMINOPHEN 5-325 MG PO TABS: 1.0000 | ORAL_TABLET | Freq: Four times a day (QID) | ORAL | Status: DC | PRN

## 2015-09-28 MED ORDER — LABETALOL HCL 5 MG/ML IV SOLN
INTRAVENOUS | Status: DC | PRN
Start: 2015-09-28 — End: 2015-09-28
  Administered 2015-09-28: 5 mg via INTRAVENOUS

## 2015-09-28 MED ORDER — CALCIUM CARBONATE-VITAMIN D 500-200 MG-UNIT PO TABS
1.0000 | ORAL_TABLET | Freq: Every day | ORAL | Status: DC
  Administered 2015-09-29 – 2015-10-03 (×5): 1 via ORAL
  Filled 2015-09-28 (×6): qty 1

## 2015-09-28 MED ORDER — HYDROCHLOROTHIAZIDE 25 MG PO TABS
25.0000 mg | ORAL_TABLET | Freq: Every day | ORAL | Status: DC
  Administered 2015-09-28 – 2015-10-03 (×6): 25 mg via ORAL
  Filled 2015-09-28 (×6): qty 1

## 2015-09-28 MED ORDER — MENTHOL 3 MG MT LOZG
1.0000 | LOZENGE | OROMUCOSAL | Status: DC | PRN
  Administered 2015-09-28: 3 mg via ORAL
  Filled 2015-09-28: qty 9

## 2015-09-28 MED ORDER — VITAMIN E 180 MG (400 UNIT) PO CAPS
400.0000 [IU] | ORAL_CAPSULE | Freq: Every day | ORAL | Status: DC
  Administered 2015-09-28 – 2015-10-03 (×6): 400 [IU] via ORAL
  Filled 2015-09-28 (×6): qty 1

## 2015-09-28 MED ORDER — VANCOMYCIN HCL 1000 MG IV SOLR
INTRAVENOUS | Status: DC | PRN
Start: 2015-09-28 — End: 2015-09-28
  Administered 2015-09-28: 1000 mg

## 2015-09-28 MED ORDER — ACETAMINOPHEN 650 MG RE SUPP: 650.0000 mg | RECTAL | Status: DC | PRN

## 2015-09-28 MED ORDER — SODIUM CHLORIDE 0.9 % IV SOLN: 250.0000 mL | INTRAVENOUS | Status: DC

## 2015-09-28 MED ORDER — DEXAMETHASONE SODIUM PHOSPHATE 10 MG/ML IJ SOLN
INTRAMUSCULAR | Status: AC
  Filled 2015-09-28: qty 1

## 2015-09-28 MED ORDER — PROPOFOL 10 MG/ML IV BOLUS
INTRAVENOUS | Status: DC | PRN
  Administered 2015-09-28: 120 mg via INTRAVENOUS

## 2015-09-28 MED ORDER — FENTANYL CITRATE (PF) 100 MCG/2ML IJ SOLN
INTRAMUSCULAR | Status: DC | PRN
  Administered 2015-09-28 (×4): 50 ug via INTRAVENOUS
  Administered 2015-09-28: 25 ug via INTRAVENOUS
  Administered 2015-09-28: 150 ug via INTRAVENOUS
  Administered 2015-09-28: 50 ug via INTRAVENOUS
  Administered 2015-09-28: 100 ug via INTRAVENOUS
  Administered 2015-09-28 (×2): 50 ug via INTRAVENOUS
  Administered 2015-09-28: 25 ug via INTRAVENOUS

## 2015-09-28 MED ORDER — METANX 3-35-2 MG PO TABS: 1.0000 | ORAL_TABLET | Freq: Two times a day (BID) | ORAL | Status: DC

## 2015-09-28 MED ORDER — PHENYLEPHRINE HCL 10 MG/ML IJ SOLN
INTRAMUSCULAR | Status: AC
  Filled 2015-09-28: qty 1

## 2015-09-28 MED ORDER — ROCURONIUM BROMIDE 50 MG/5ML IV SOLN
INTRAVENOUS | Status: AC
  Filled 2015-09-28: qty 1

## 2015-09-28 MED ORDER — ACETAMINOPHEN 325 MG PO TABS: 650.0000 mg | ORAL_TABLET | ORAL | Status: DC | PRN

## 2015-09-28 MED ORDER — VITAMIN B COMPLEX PO TABS: ORAL_TABLET | Freq: Every day | ORAL | Status: DC

## 2015-09-28 MED ORDER — SODIUM CHLORIDE 0.9 % IJ SOLN
3.0000 mL | Freq: Two times a day (BID) | INTRAMUSCULAR | Status: DC
  Administered 2015-09-28 – 2015-10-02 (×5): 3 mL via INTRAVENOUS

## 2015-09-28 MED ORDER — ALBUTEROL SULFATE HFA 108 (90 BASE) MCG/ACT IN AERS
INHALATION_SPRAY | RESPIRATORY_TRACT | Status: DC | PRN
  Administered 2015-09-28: 2 via RESPIRATORY_TRACT

## 2015-09-28 MED ORDER — OXYCODONE-ACETAMINOPHEN 5-325 MG PO TABS
ORAL_TABLET | ORAL | Status: AC
  Administered 2015-09-28: 2 via ORAL
  Filled 2015-09-28: qty 2

## 2015-09-28 MED ORDER — THROMBIN 20000 UNITS EX SOLR
CUTANEOUS | Status: DC | PRN
  Administered 2015-09-28: 10:00:00 via TOPICAL

## 2015-09-28 MED ORDER — PHENYLEPHRINE HCL 10 MG/ML IJ SOLN
INTRAMUSCULAR | Status: DC | PRN
  Administered 2015-09-28 (×2): 120 ug via INTRAVENOUS
  Administered 2015-09-28: 80 ug via INTRAVENOUS

## 2015-09-28 MED ORDER — MEPERIDINE HCL 25 MG/ML IJ SOLN: 6.2500 mg | INTRAMUSCULAR | Status: DC | PRN

## 2015-09-28 MED ORDER — PAROXETINE HCL 20 MG PO TABS
30.0000 mg | ORAL_TABLET | Freq: Every day | ORAL | Status: DC
  Administered 2015-09-29 – 2015-10-03 (×5): 30 mg via ORAL
  Filled 2015-09-28 (×5): qty 2

## 2015-09-28 MED ORDER — MIDAZOLAM HCL 2 MG/2ML IJ SOLN
INTRAMUSCULAR | Status: AC
  Filled 2015-09-28: qty 2

## 2015-09-28 MED ORDER — VITAMIN B-12 1000 MCG PO TABS
1000.0000 ug | ORAL_TABLET | Freq: Every day | ORAL | Status: DC
  Administered 2015-09-28 – 2015-10-02 (×5): 1000 ug via ORAL
  Filled 2015-09-28 (×5): qty 1

## 2015-09-28 MED ORDER — GABAPENTIN 300 MG PO CAPS
300.0000 mg | ORAL_CAPSULE | Freq: Every day | ORAL | Status: DC
  Administered 2015-09-28 – 2015-10-02 (×5): 300 mg via ORAL
  Filled 2015-09-28 (×5): qty 1

## 2015-09-28 MED ORDER — PHENOL 1.4 % MT LIQD
1.0000 | OROMUCOSAL | Status: DC | PRN
  Administered 2015-09-28: 1 via OROMUCOSAL
  Filled 2015-09-28: qty 177

## 2015-09-28 MED ORDER — LIDOCAINE HCL (CARDIAC) 20 MG/ML IV SOLN
INTRAVENOUS | Status: DC | PRN
  Administered 2015-09-28: 50 mg via INTRAVENOUS

## 2015-09-28 MED ORDER — SODIUM CHLORIDE 0.9 % IR SOLN
Status: DC | PRN
  Administered 2015-09-28: 10:00:00

## 2015-09-28 MED ORDER — PANTOPRAZOLE SODIUM 40 MG PO TBEC
40.0000 mg | DELAYED_RELEASE_TABLET | Freq: Every day | ORAL | Status: DC
  Administered 2015-09-29 – 2015-10-03 (×5): 40 mg via ORAL
  Filled 2015-09-28 (×5): qty 1

## 2015-09-28 MED ORDER — VANCOMYCIN HCL IN DEXTROSE 1-5 GM/200ML-% IV SOLN
1000.0000 mg | Freq: Two times a day (BID) | INTRAVENOUS | Status: DC
  Administered 2015-09-29 – 2015-09-30 (×3): 1000 mg via INTRAVENOUS
  Filled 2015-09-28 (×4): qty 200

## 2015-09-28 MED ORDER — POLYETHYLENE GLYCOL 3350 17 G PO PACK: 17.0000 g | PACK | Freq: Every day | ORAL | Status: DC | PRN

## 2015-09-28 MED ORDER — ALBUTEROL SULFATE HFA 108 (90 BASE) MCG/ACT IN AERS
INHALATION_SPRAY | RESPIRATORY_TRACT | Status: AC
  Filled 2015-09-28: qty 6.7

## 2015-09-28 MED ORDER — VANCOMYCIN HCL 10 G IV SOLR
1500.0000 mg | Freq: Once | INTRAVENOUS | Status: AC
  Administered 2015-09-28: 1500 mg via INTRAVENOUS
  Filled 2015-09-28: qty 1500

## 2015-09-28 MED ORDER — SODIUM CHLORIDE 0.9 % IJ SOLN: 3.0000 mL | INTRAMUSCULAR | Status: DC | PRN

## 2015-09-28 MED ORDER — AZELAIC ACID 20 % EX CREA: 1.0000 "application " | TOPICAL_CREAM | Freq: Two times a day (BID) | CUTANEOUS | Status: DC

## 2015-09-28 MED ORDER — DOCUSATE SODIUM 100 MG PO CAPS
100.0000 mg | ORAL_CAPSULE | Freq: Two times a day (BID) | ORAL | Status: DC
  Administered 2015-09-28 – 2015-10-03 (×10): 100 mg via ORAL
  Filled 2015-09-28 (×10): qty 1

## 2015-09-28 MED ORDER — MIDAZOLAM HCL 5 MG/5ML IJ SOLN
INTRAMUSCULAR | Status: DC | PRN
  Administered 2015-09-28: 2 mg via INTRAVENOUS

## 2015-09-28 MED ORDER — POTASSIUM CHLORIDE CRYS ER 20 MEQ PO TBCR
20.0000 meq | EXTENDED_RELEASE_TABLET | Freq: Every day | ORAL | Status: DC
  Administered 2015-09-29 – 2015-10-03 (×5): 20 meq via ORAL
  Filled 2015-09-28 (×6): qty 1

## 2015-09-28 MED ORDER — SUGAMMADEX SODIUM 200 MG/2ML IV SOLN
INTRAVENOUS | Status: DC | PRN
  Administered 2015-09-28 (×2): 200 mg via INTRAVENOUS

## 2015-09-28 MED ORDER — PROMETHAZINE HCL 25 MG/ML IJ SOLN: 6.2500 mg | INTRAMUSCULAR | Status: DC | PRN

## 2015-09-28 MED ORDER — L-METHYLFOLATE-B6-B12 3-35-2 MG PO TABS
1.0000 | ORAL_TABLET | Freq: Two times a day (BID) | ORAL | Status: DC
  Administered 2015-09-28 – 2015-10-03 (×10): 1 via ORAL
  Filled 2015-09-28 (×12): qty 1

## 2015-09-28 MED ORDER — CYCLOBENZAPRINE HCL 10 MG PO TABS
10.0000 mg | ORAL_TABLET | Freq: Three times a day (TID) | ORAL | Status: DC | PRN
  Administered 2015-09-29 – 2015-10-03 (×4): 10 mg via ORAL
  Filled 2015-09-28 (×4): qty 1

## 2015-09-28 MED ORDER — OXYCODONE-ACETAMINOPHEN 5-325 MG PO TABS
1.0000 | ORAL_TABLET | ORAL | Status: DC | PRN
  Administered 2015-09-28 – 2015-10-03 (×14): 2 via ORAL
  Filled 2015-09-28 (×13): qty 2

## 2015-09-28 MED ORDER — ADULT MULTIVITAMIN W/MINERALS CH
1.0000 | ORAL_TABLET | Freq: Every day | ORAL | Status: DC
  Administered 2015-09-28 – 2015-10-03 (×6): 1 via ORAL
  Filled 2015-09-28 (×6): qty 1

## 2015-09-28 MED ORDER — LOSARTAN POTASSIUM-HCTZ 100-25 MG PO TABS: 1.0000 | ORAL_TABLET | Freq: Every morning | ORAL | Status: DC

## 2015-09-28 MED ORDER — LABETALOL HCL 5 MG/ML IV SOLN
INTRAVENOUS | Status: AC
  Filled 2015-09-28: qty 8

## 2015-09-28 MED ORDER — HYDROMORPHONE HCL 1 MG/ML IJ SOLN
0.2500 mg | INTRAMUSCULAR | Status: DC | PRN
  Administered 2015-09-28: 0.25 mg via INTRAVENOUS

## 2015-09-28 MED ORDER — LACTATED RINGERS IV SOLN
INTRAVENOUS | Status: DC | PRN
  Administered 2015-09-28 (×3): via INTRAVENOUS

## 2015-09-28 MED ORDER — HYDROMORPHONE HCL 1 MG/ML IJ SOLN
INTRAMUSCULAR | Status: AC
  Administered 2015-09-28: 0.25 mg via INTRAVENOUS
  Filled 2015-09-28: qty 1

## 2015-09-28 SURGICAL SUPPLY — 83 items
ALLOSTEM STRIP 20MMX50MM (Tissue) ×1 IMPLANT
APL SKNCLS STERI-STRIP NONHPOA (GAUZE/BANDAGES/DRESSINGS) ×1
BAG DECANTER FOR FLEXI CONT (MISCELLANEOUS) ×2 IMPLANT
BENZOIN TINCTURE PRP APPL 2/3 (GAUZE/BANDAGES/DRESSINGS) ×2 IMPLANT
BLADE CLIPPER SURG (BLADE) IMPLANT
BLADE SURG 11 STRL SS (BLADE) ×2 IMPLANT
BONE ALLOSTEM MORSELIZED 5CC (Bone Implant) ×1 IMPLANT
BRUSH SCRUB EZ PLAIN DRY (MISCELLANEOUS) ×2 IMPLANT
BUR MATCHSTICK NEURO 3.0 LAGG (BURR) ×2 IMPLANT
BUR PRECISION FLUTE 6.0 (BURR) ×2 IMPLANT
CAGE RISE 10X22 11-17 (Cage) ×2 IMPLANT
CANISTER SUCT 3000ML PPV (MISCELLANEOUS) ×2 IMPLANT
CAP LOCKING THREADED (Cap) ×5 IMPLANT
CONT SPEC 4OZ CLIKSEAL STRL BL (MISCELLANEOUS) ×2 IMPLANT
CORDS BIPOLAR (ELECTRODE) ×1 IMPLANT
COVER BACK TABLE 24X17X13 BIG (DRAPES) IMPLANT
COVER BACK TABLE 60X90IN (DRAPES) ×2 IMPLANT
DECANTER SPIKE VIAL GLASS SM (MISCELLANEOUS) ×2 IMPLANT
DRAPE C-ARM 42X72 X-RAY (DRAPES) ×2 IMPLANT
DRAPE C-ARMOR (DRAPES) ×2 IMPLANT
DRAPE LAPAROTOMY 100X72X124 (DRAPES) ×2 IMPLANT
DRAPE POUCH INSTRU U-SHP 10X18 (DRAPES) ×2 IMPLANT
DRAPE PROXIMA HALF (DRAPES) IMPLANT
DRAPE SURG 17X23 STRL (DRAPES) ×2 IMPLANT
DRSG OPSITE 4X5.5 SM (GAUZE/BANDAGES/DRESSINGS) ×1 IMPLANT
DRSG OPSITE POSTOP 4X8 (GAUZE/BANDAGES/DRESSINGS) ×1 IMPLANT
DURAPREP 26ML APPLICATOR (WOUND CARE) ×2 IMPLANT
ELECT BLADE 4.0 EZ CLEAN MEGAD (MISCELLANEOUS) ×2
ELECT CAUTERY BLADE 6.4 (BLADE) ×1 IMPLANT
ELECT REM PT RETURN 9FT ADLT (ELECTROSURGICAL) ×2
ELECTRODE BLDE 4.0 EZ CLN MEGD (MISCELLANEOUS) IMPLANT
ELECTRODE REM PT RTRN 9FT ADLT (ELECTROSURGICAL) ×1 IMPLANT
EVACUATOR 3/16  PVC DRAIN (DRAIN) ×1
EVACUATOR 3/16 PVC DRAIN (DRAIN) ×1 IMPLANT
GAUZE SPONGE 4X4 12PLY STRL (GAUZE/BANDAGES/DRESSINGS) ×2 IMPLANT
GAUZE SPONGE 4X4 16PLY XRAY LF (GAUZE/BANDAGES/DRESSINGS) ×1 IMPLANT
GLOVE BIO SURGEON STRL SZ8 (GLOVE) ×2 IMPLANT
GLOVE BIOGEL PI IND STRL 7.0 (GLOVE) IMPLANT
GLOVE BIOGEL PI IND STRL 7.5 (GLOVE) IMPLANT
GLOVE BIOGEL PI INDICATOR 7.0 (GLOVE) ×1
GLOVE BIOGEL PI INDICATOR 7.5 (GLOVE) ×1
GLOVE ECLIPSE 7.5 STRL STRAW (GLOVE) IMPLANT
GLOVE EXAM NITRILE LRG STRL (GLOVE) IMPLANT
GLOVE EXAM NITRILE MD LF STRL (GLOVE) IMPLANT
GLOVE EXAM NITRILE XL STR (GLOVE) IMPLANT
GLOVE EXAM NITRILE XS STR PU (GLOVE) IMPLANT
GLOVE INDICATOR 8.5 STRL (GLOVE) ×2 IMPLANT
GLOVE SURG SS PI 6.5 STRL IVOR (GLOVE) ×3 IMPLANT
GLOVE SURG SS PI 7.0 STRL IVOR (GLOVE) ×3 IMPLANT
GLOVE SURG SS PI 8.0 STRL IVOR (GLOVE) ×3 IMPLANT
GOWN STRL REUS W/ TWL LRG LVL3 (GOWN DISPOSABLE) IMPLANT
GOWN STRL REUS W/ TWL XL LVL3 (GOWN DISPOSABLE) ×2 IMPLANT
GOWN STRL REUS W/TWL 2XL LVL3 (GOWN DISPOSABLE) IMPLANT
GOWN STRL REUS W/TWL LRG LVL3 (GOWN DISPOSABLE)
GOWN STRL REUS W/TWL XL LVL3 (GOWN DISPOSABLE) ×4
KIT BASIN OR (CUSTOM PROCEDURE TRAY) ×2 IMPLANT
KIT ROOM TURNOVER OR (KITS) ×2 IMPLANT
LIQUID BAND (GAUZE/BANDAGES/DRESSINGS) ×2 IMPLANT
MILL MEDIUM DISP (BLADE) ×1 IMPLANT
NDL HYPO 21X1.5 SAFETY (NEEDLE) ×1 IMPLANT
NDL HYPO 25X1 1.5 SAFETY (NEEDLE) ×1 IMPLANT
NEEDLE HYPO 21X1.5 SAFETY (NEEDLE) ×2 IMPLANT
NEEDLE HYPO 25X1 1.5 SAFETY (NEEDLE) ×2 IMPLANT
NS IRRIG 1000ML POUR BTL (IV SOLUTION) ×2 IMPLANT
PACK LAMINECTOMY NEURO (CUSTOM PROCEDURE TRAY) ×2 IMPLANT
PAD ARMBOARD 7.5X6 YLW CONV (MISCELLANEOUS) ×6 IMPLANT
PENCIL BUTTON HOLSTER BLD 10FT (ELECTRODE) ×1 IMPLANT
ROD 70MM SPINAL (Rod) ×1 IMPLANT
ROD 75MM SPINAL (Rod) ×1 IMPLANT
SCREW AMP MODULAR CREO 6.5X45 (Screw) ×5 IMPLANT
SCREW PA THRD CREO TULIP 5.5X4 (Head) ×6 IMPLANT
SPONGE LAP 4X18 X RAY DECT (DISPOSABLE) ×1 IMPLANT
SPONGE SURGIFOAM ABS GEL 100 (HEMOSTASIS) ×2 IMPLANT
STRIP CLOSURE SKIN 1/2X4 (GAUZE/BANDAGES/DRESSINGS) ×3 IMPLANT
SUT VIC AB 0 CT1 18XCR BRD8 (SUTURE) ×2 IMPLANT
SUT VIC AB 0 CT1 8-18 (SUTURE) ×4
SUT VIC AB 2-0 CT1 18 (SUTURE) ×2 IMPLANT
SUT VIC AB 4-0 PS2 27 (SUTURE) ×2 IMPLANT
SYR 20CC LL (SYRINGE) ×1 IMPLANT
TOWEL OR 17X24 6PK STRL BLUE (TOWEL DISPOSABLE) ×2 IMPLANT
TOWEL OR 17X26 10 PK STRL BLUE (TOWEL DISPOSABLE) ×2 IMPLANT
TRAY FOLEY W/METER SILVER 14FR (SET/KITS/TRAYS/PACK) ×2 IMPLANT
WATER STERILE IRR 1000ML POUR (IV SOLUTION) ×2 IMPLANT

## 2015-09-28 NOTE — Anesthesia Procedure Notes (Signed)
Procedure Name: Intubation Date/Time: 09/28/2015 8:37 AM Performed by: Shirlyn Goltz Pre-anesthesia Checklist: Patient identified, Emergency Drugs available, Suction available and Patient being monitored Patient Re-evaluated:Patient Re-evaluated prior to inductionOxygen Delivery Method: Circle system utilized Preoxygenation: Pre-oxygenation with 100% oxygen Intubation Type: IV induction Ventilation: Mask ventilation without difficulty and Oral airway inserted - appropriate to patient size Laryngoscope Size: Glidescope and 2 Grade View: Grade I Tube type: Oral Tube size: 7.0 mm Number of attempts: 1 Airway Equipment and Method: Stylet and Video-laryngoscopy Placement Confirmation: ETT inserted through vocal cords under direct vision,  positive ETCO2 and breath sounds checked- equal and bilateral Secured at: 21 cm Tube secured with: Tape Dental Injury: Teeth and Oropharynx as per pre-operative assessment

## 2015-09-28 NOTE — Anesthesia Postprocedure Evaluation (Signed)
Anesthesia Post Note  Patient: Enedina Finner Lyles-Williams  Procedure(s) Performed: Procedure(s) (LRB): Posterior Lumbar Interbody Fusion Lumbar one-Lumbar two, Exploration and Removal Lumbar two- four (N/A)  Patient location during evaluation: PACU Anesthesia Type: General Level of consciousness: awake and alert, oriented and patient cooperative Pain management: pain level controlled Vital Signs Assessment: post-procedure vital signs reviewed and stable Respiratory status: spontaneous breathing, nonlabored ventilation, respiratory function stable and patient connected to nasal cannula oxygen Cardiovascular status: blood pressure returned to baseline and stable Postop Assessment: no signs of nausea or vomiting Anesthetic complications: no    Last Vitals:  Filed Vitals:   09/28/15 1545 09/28/15 1600  BP: 151/74 139/83  Pulse: 100 99  Temp:    Resp: 15 20    Last Pain:  Filed Vitals:   09/28/15 1602  PainSc: 2                  JACKSON,E. CARSWELL

## 2015-09-28 NOTE — Transfer of Care (Signed)
Immediate Anesthesia Transfer of Care Note  Patient: Amy [name]  Procedure(s) Performed: Procedure(s): Posterior Lumbar Interbody Fusion Lumbar one-Lumbar two, Exploration and Removal Lumbar two- four (N/A)  Patient Location: PACU  Anesthesia Type:General  Level of Consciousness: awake and patient cooperative  Airway & Oxygen Therapy: Patient Spontanous Breathing and Patient connected to face mask oxygen  Post-op Assessment: Report given to RN, Post -op Vital signs reviewed and stable, Patient moving all extremities and Patient moving all extremities X 4  Post vital signs: Reviewed and stable  Last Vitals:  Filed Vitals:   09/28/15 0714  BP: 172/85  Pulse: 92  Temp: 36.8 C  Resp: 20    Complications: No apparent anesthesia complications

## 2015-09-28 NOTE — Op Note (Signed)
Preoperative diagnosis: Lumbar spinal stenosis and degenerative disc disease and severe foraminal stenosis L1-L2 due to segmental deterioration above previous L2-S1 fusion  Postoperative diagnosis: Same  Procedure: #1 expiration of fusion removal of hardware L2-L4 with removal of a hybrid K2M construct with Everest and an older version. Due to the lack of availability of the old top tightening nuts had to remove all screws however I was unable to remove the left L3 screw  #2 decompressive lumbar laminectomy L1-L2 with complete medial facetectomies a radical foraminotomies in excess and requiring more work than would be needed with a standard interbody fusion  #3 posterior lumbar interbody fusion L1 and L2 utilizing the globus arise expandable cage system utilizing locally harvested autograft mixed withallostem morsels and strips  #4 pedicle screw fixation L2-L4 with new screws of the globus Creo amp 6 5 x 45 placed at L2, L3 on the right side and L4 bilaterally  #5 posterior lateral arthrodesis L1-L2 utilizing the locally harvested autograft mixed  Surgeon: Dominica Severin cram  Anesthesia: Gen.  EBL: Minimal  History of present illness: Patient is a very pleasant 70 year old female had long-standing back pain and previously undergone an L3 to S1 fusion and developed a pseudoarthrosis and was revised from L2-L4 and subsequently has had segmental deterioration degeneration at L1-L2. Due to patient's failure conservative treatment imaging findings and progression of clinical syndrome I recommended decompression stabilization procedure of L1-L2 with exploration of fusion removal of hardware L2-L4. I extensively reviewed the risks and benefits of the operation with the patient as well as perioperative course expectations of outcome and alternatives of surgery and she understood and agreed to proceed forward.  Operative procedure: Patient brought into the or was induced on general anesthesia positioned prone  on the Comprehensive Outpatient Surge table her back was prepped and draped in routine sterile fashion her old incision was opened up and the scar tissues dissected free and subperiosteal dissections care lamina of L1-L2 and exposed the hardware from L2-L4. The fusion did appear to be solid 30 so at this point I removed all the knots from L2-L4 on the previous construct and it was readily apparent that she had a hybrid construct with 2 different systems 1 very old version of K2M 1 new or version of K2M. I was able remove 45 screws of Bell's unable to remove the left-sided L3 screw as the head was stripped. I utilized both the easy out universal set as well as the K2 M screwdriver. So at this point I exposed the TPs at L2 confirmed with x-ray remove the spinous process of L1 exposed the TPs of L1 began the central decompression there was marked hourglass compression of thecal sac and performed complete medial facetectomies a radical foraminotomies of the L1 and L2 nerve roots. After I obtained access to the lateral margins disc space going the epidural veins cleaned out the disc space bilaterally sequential distraction with an 11 distractor in place I selected 11-17 expandable cages. After adequate endplate preparation been achieved I inserted one cage packed local autograft centrally inserted contralateral cage. Fluoroscopy was used the step along the way to confirm good cage position. Both cages were expanded approximate 6 turns. Then I placed bilateral L2 pedicle screws utilizing fluoroscopy in standard fashion. Utilizing the existing holes I replaced them with existing screws that were 6.5 x 45 mm in length at L3 on the right and L4 bilaterally. A total of 5 new screws keeping the old left-sided L3 K2M screw behind. The wounds and copiously  irrigated seems this was maintained aggressive decortication was care MTPs and lateral gutters from L1-L2 the remainder the local autograft mixed was packed posterior laterally from L1-L2 then I  selected 2 rods a 70-80 both rods were anchored in place I utilized the old top tightening nuts for the old K2M screw. Anchored all the rods and placed anchored all the knots in place and then reinspected the foramina to confirm patency overlaid Gelfoam and top of the dura placed a large Hemovac drain and closed the wound in layers with interrupted Vicryl's and a running 4 subcuticular Dermabond benzo and Steri-Strips and sterile dressings were applied and patient recovered in stable condition. At the end of the case all needle counts sponge counts were correct.

## 2015-09-28 NOTE — Anesthesia Preprocedure Evaluation (Addendum)
Anesthesia Evaluation  Patient identified by MRN, date of birth, ID band Patient awake    Reviewed: Allergy & Precautions, NPO status , Patient's Chart, lab work & pertinent test results  History of Anesthesia Complications Negative for: history of anesthetic complications  Airway Mallampati: II  TM Distance: >3 FB Neck ROM: Full    Dental  (+) Chipped, Dental Advisory Given   Pulmonary asthma , sleep apnea (mild-does not ned CPAP) , COPD,  COPD inhaler, former smoker (quit 1970),    breath sounds clear to auscultation       Cardiovascular hypertension, Pt. on medications (-) angina+ DVT (remote h/o DVT arm)   Rhythm:Regular Rate:Normal     Neuro/Psych Anxiety Depression Chronic back pain: narcotics    GI/Hepatic Neg liver ROS, GERD  Medicated and Controlled,  Endo/Other  Morbid obesity  Renal/GU negative Renal ROS     Musculoskeletal  (+) Arthritis , Fibromyalgia -  Abdominal (+) + obese,   Peds  Hematology   Anesthesia Other Findings   Reproductive/Obstetrics                            Anesthesia Physical Anesthesia Plan  ASA: III  Anesthesia Plan: General   Post-op Pain Management:    Induction: Intravenous  Airway Management Planned: Oral ETT  Additional Equipment:   Intra-op Plan:   Post-operative Plan: Extubation in OR  Informed Consent: I have reviewed the patients History and Physical, chart, labs and discussed the procedure including the risks, benefits and alternatives for the proposed anesthesia with the patient or authorized representative who has indicated his/her understanding and acceptance.   Dental advisory given  Plan Discussed with: CRNA and Surgeon  Anesthesia Plan Comments: (Plan routine monitors, GETA with VideoGlide intubation)        Anesthesia Quick Evaluation

## 2015-09-28 NOTE — Progress Notes (Signed)
ANTIBIOTIC CONSULT NOTE - INITIAL  Pharmacy Consult for Vancomycin Indication: surgical prophylaxis for spinal drain  Allergies  Allergen Reactions  . Latex Other (See Comments)    Sores, blisters  . Penicillins Rash    Patient Measurements: Weight: 273 lb (123.832 kg)   Vital Signs: Temp: 97.8 F (36.6 C) (01/04 1355) Temp Source: Oral (01/04 0714) BP: 139/83 mmHg (01/04 1600) Pulse Rate: 99 (01/04 1600) Intake/Output from previous day:   Intake/Output from this shift: Total I/O In: 2480 [I.V.:2000; Blood:230; IV Piggyback:250] Out: 950 [Urine:250; Blood:700]  Labs: No results for input(s): WBC, HGB, PLT, LABCREA, CREATININE in the last 72 hours. Estimated Creatinine Clearance: 68 mL/min (by C-G formula based on Cr of 1.04). No results for input(s): VANCOTROUGH, VANCOPEAK, VANCORANDOM, GENTTROUGH, GENTPEAK, GENTRANDOM, TOBRATROUGH, TOBRAPEAK, TOBRARND, AMIKACINPEAK, AMIKACINTROU, AMIKACIN in the last 72 hours.   Microbiology: Recent Results (from the past 720 hour(s))  Surgical pcr screen     Status: Abnormal   Collection Time: 09/21/15  9:10 AM  Result Value Ref Range Status   MRSA, PCR NEGATIVE NEGATIVE Final   Staphylococcus aureus POSITIVE (A) NEGATIVE Final    Comment:        The Xpert SA Assay (FDA approved for NASAL specimens in patients over 70 years of age), is one component of a comprehensive surveillance program.  Test performance has been validated by [facility] for patients greater than or equal to 52 year old. It is not intended to diagnose infection nor to guide or monitor treatment.     Medical History: Past Medical History  Diagnosis Date  . Anxiety   . Dysphonia     intermittent "voice changes"  . Chronic back pain     lumbar steroid injection's  . Seasonal allergies   . Fibromyalgia   . Weakness     both hands and feet  . Urinary frequency   . Glaucoma   . History of MRSA infection 2011  . History of DVT (deep vein  thrombosis) 1989    LEFT UPPER ARM  . Hypertension   . Short of breath on exertion   . Depression   . GERD (gastroesophageal reflux disease)   . Pelvic pain in female   . Arthritis     ddd- RA  . Irregular heart rate     "years ago"  . Peripheral vascular disease ([place])     "poor circulation"  . Mild obstructive sleep apnea     per study 02-07-2006 - no cpap  . Asthma     "sleeping asthma"  . History of hiatal hernia   . Headache   . Neuropathy (HCC)     feet  . Low iron   . Constipation    Assessment: 70 year old female s/p lumbar fusion/back surgery. Hemovac drain placed, orders to start IV vancomycin for surgical prophylaxis while drain is present. PCN allergy.  Goal of Therapy:  Vancomycin trough level 10-15 mcg/ml  Plan:  Measure antibiotic drug levels at steady state Vancomycin 1500mg  x1 then 1g IV q12 hour  Erin Hearing PharmD., BCPS Clinical Pharmacist Pager [phone] 09/28/2015 5:00 PM

## 2015-09-28 NOTE — H&P (Signed)
Amy [name] is an 70 y.o. female.   Chief Complaint: Back and left greater than right leg pain HPI: Patient is a very pleasant 70 year old female previously undergone L2-L4 fusion who presents with progressive worsening back and bilateral leg pain radiating into her lower part of her abdomen groin and medial thigh. Workup has revealed progressive segmental degeneration at L1-L2 above her previous L2-L4 fusion due to her failure conservative treatment imaging findings and progression of clinical syndrome I recommended decompression and interbody fusion at L1-L2 with an exploration of fusion removal of hardware L2-L4. I've extensively gone over the risks and benefits of the operation with the patient as well as perioperative course expectations of outcome and alternatives of surgery and she understands and agrees to proceed forward.  Past Medical History  Diagnosis Date  . Anxiety   . Dysphonia     intermittent "voice changes"  . Chronic back pain     lumbar steroid injection's  . Seasonal allergies   . Fibromyalgia   . Weakness     both hands and feet  . Urinary frequency   . Glaucoma   . History of MRSA infection 2011  . History of DVT (deep vein thrombosis) 1989    LEFT UPPER ARM  . Hypertension   . Short of breath on exertion   . Depression   . GERD (gastroesophageal reflux disease)   . Pelvic pain in female   . Arthritis     ddd- RA  . Irregular heart rate     "years ago"  . Peripheral vascular disease ([place])     "poor circulation"  . Mild obstructive sleep apnea     per study 02-07-2006 - no cpap  . Asthma     "sleeping asthma"  . History of hiatal hernia   . Headache   . Neuropathy (HCC)     feet  . Low iron   . Constipation     Past Surgical History  Procedure Laterality Date  . Abdominal hysterectomy  1978  . Nose surgery  2007  . Mass excision Left 02/20/2013    Procedure: EXCISION LEFT BUTTOCK  MASS;  Surgeon: Adin Hector, MD;  Location: [facility]  ORS;  Service: General;  Laterality: Left;  . Esophagogastroduodenoscopy N/A 07/15/2013    Procedure: ESOPHAGOGASTRODUODENOSCOPY (EGD);  Surgeon: Winfield Cunas., MD;  Location: [facility];  Service: Endoscopy;  Laterality: N/A;  need xray  . Abdominal adhesions removed    . Colonoscopy    . Bunionectomy Left 2008  . Shoulder open rotator cuff repair Left 05/05/2014    Procedure: OPEN ACROMIONECTOMY AND OPEN REPAIR OF ROTATOR CUFF, TISSUEMEND GRAFT WITH ANCHOR ;  Surgeon: Tobi Bastos, MD;  Location: [facility] ORS;  Service: Orthopedics;  Laterality: Left;  . Esophageal manometry N/A 11/22/2014    Procedure: ESOPHAGEAL MANOMETRY (EM);  Surgeon: Winfield Cunas., MD;  Location: [facility];  Service: Endoscopy;  Laterality: N/A;  . Lumbar fusion  2014    L4 -- L5  . Removal hardware l4-l5/  bilateral laminectomy l2 - l5 and fusion  06-12-2011  . Laparoscopic cholecystectomy  01-11-2006  . Cysto with hydrodistension N/A 07/12/2015    Procedure: CYSTOSCOPY/HYDRODISTENSION;  Surgeon: Bjorn Loser, MD;  Location: [facility];  Service: Urology;  Laterality: N/A;  . Eye surgery Bilateral     cataract surgery with lens implants  . Tonsillectomy      Family History  Problem Relation Age of Onset  . Hypertension  Mother   . Cancer Mother     cervix  . Kidney disease Mother   . Hypertension Sister   . Cancer Sister     polyps  . Kidney disease Brother   . Cancer Daughter     leukemia  . Hypertension Brother    Social History:  reports that she quit smoking about 46 years ago. She has never used smokeless tobacco. She reports that she drinks alcohol. She reports that she does not use illicit drugs.  Allergies:  Allergies  Allergen Reactions  . Latex Other (See Comments)    Sores, blisters  . Penicillins Rash    Medications Prior to Admission  Medication Sig Dispense Refill  . acetaminophen (TYLENOL) 500 MG tablet Take 1,000 mg by mouth every 8 (eight) hours  as needed for pain.    Marland Kitchen azelaic acid (AZELEX) 20 % cream Apply 1 application topically 2 (two) times daily. After skin is thoroughly washed and patted dry, gently but thoroughly massage a thin film of azelaic acid cream into the affected area twice daily, in the morning and evening.    . B Complex Vitamins (VITAMIN B COMPLEX PO) Take 1 tablet by mouth daily.    . calcium citrate-vitamin D (CITRACAL+D) 315-200 MG-UNIT per tablet Take 1 tablet by mouth daily.    . diphenhydrAMINE (BENADRYL) 25 mg capsule Take 25 mg by mouth 2 (two) times daily as needed for allergies.    Marland Kitchen ESTRACE VAGINAL 0.1 MG/GM vaginal cream Place 1 application vaginally 2 (two) times a week.   5  . gabapentin (NEURONTIN) 100 MG capsule Take 300 mg by mouth at bedtime.     Marland Kitchen glycopyrrolate (ROBINUL) 2 MG tablet Take 2 mg by mouth 2 (two) times daily.     Marland Kitchen HYDROcodone-acetaminophen (NORCO/VICODIN) 5-325 MG tablet Take 1 tablet by mouth every 6 (six) hours as needed for moderate pain.    Marland Kitchen L-Methylfolate-B6-B12 (METANX PO) Take 1 tablet by mouth 2 (two) times daily.    Marland Kitchen losartan-hydrochlorothiazide (HYZAAR) 100-25 MG per tablet Take 1 tablet by mouth every morning.     . Multiple Vitamin (MULTIVITAMIN WITH MINERALS) TABS Take 1 tablet by mouth daily.    . pantoprazole (PROTONIX) 40 MG tablet Take 40 mg by mouth daily.    Marland Kitchen PARoxetine (PAXIL) 30 MG tablet Take 30 mg by mouth daily.     . potassium chloride SA (K-DUR,KLOR-CON) 20 MEQ tablet Take 1 tablet by mouth daily.  0  . silodosin (RAPAFLO) 8 MG CAPS capsule Take 8 mg by mouth daily as needed (for bladder).     . vitamin B-12 (CYANOCOBALAMIN) 1000 MCG tablet Take 1,000 mcg by mouth at bedtime.    . vitamin E (VITAMIN E) 400 UNIT capsule Take 400 Units by mouth daily.      No results found for this or any previous visit (from the past 48 hour(s)). No results found.  Review of Systems  Constitutional: Negative.   HENT: Negative.   Eyes: Negative.   Respiratory:  Negative.   Cardiovascular: Negative.   Gastrointestinal: Negative.   Genitourinary: Negative.   Musculoskeletal: Positive for myalgias and back pain.  Skin: Negative.   Neurological: Positive for tingling and sensory change.  Psychiatric/Behavioral: Negative.     Blood pressure 172/85, pulse 92, temperature 98.3 F (36.8 C), temperature source Oral, resp. rate 20, weight 123.832 kg (273 lb), SpO2 98 %. Physical Exam  Constitutional: She is oriented to person, place, and time. She appears well-developed and  well-nourished.  HENT:  Head: Normocephalic.  Eyes: Pupils are equal, round, and reactive to light.  Neck: Normal range of motion.  Respiratory: Effort normal.  GI: Soft. Bowel sounds are normal.  Neurological: She is alert and oriented to person, place, and time. She has normal strength. GCS eye subscore is 4. GCS verbal subscore is 5. GCS motor subscore is 6.  Strength is 5 out of 5 in her iliopsoas, quads, hamstrings, gastric, into tibialis, and EHL.  Skin: Skin is warm and dry.     Assessment/Plan 70 year old female presents for L1-L2 lumbar fusion with an exploration of fusion removal of hardware L2-L4  CRAM,GARY P 09/28/2015, 7:48 AM

## 2015-09-28 NOTE — Progress Notes (Signed)
Utilization review completed.  

## 2015-09-28 NOTE — OR Nursing (Signed)
Removed implants sent to be cleaned and then to be given to Dr. Saintclair Halsted. Carroll Kinds, RN

## 2015-09-29 MED FILL — Heparin Sodium (Porcine) Inj 1000 Unit/ML: INTRAMUSCULAR | Qty: 30 | Status: AC

## 2015-09-29 MED FILL — Sodium Chloride IV Soln 0.9%: INTRAVENOUS | Qty: 2000 | Status: AC

## 2015-09-29 NOTE — Clinical Social Work Note (Signed)
Clinical Social Work Assessment  Patient Details  Name: Amy [name] MRN: [id number] Date of Birth: 05/09/1946  Date of referral:  09/29/15               Reason for consult:  Facility Placement                Permission sought to share information with:  Family Supports, Customer service manager Permission granted to share information::  Yes, Verbal Permission Granted  Name::     Johnell Comings [phone] or [phone]  Agency::  SNF admissions  Relationship::     Contact Information:     Housing/Transportation Living arrangements for the past 2 months:  [place] of Information:  Patient Patient Interpreter Needed:  None Criminal Activity/Legal Involvement Pertinent to Current Situation/Hospitalization:  No - Comment as needed Significant Relationships:  Spouse Lives with:  Spouse Do you feel safe going back to the place where you live?  Yes (Patient feels once she has received some short term rehab she can return back home.) Need for family participation in patient care:  No (Coment)  Care giving concerns:  Patient feels she needs some short term rehab in order to return back home.   Social Worker assessment / plan:  Patient is an alert and oriented married 70 year old female who lives with her husband.  CSW asked patient is she has been to short term rehab in the past and patient stated she has.  Patient expressed that she would like to return to [facility].  CSW was given permission to fax out patient's information to other SNFs in [facility] as well.  Patient stated she was familiar with the process, CSW explained to patient and her husband how insurance pays for patient's stay and role of CSW.  CSW asked patient if she had any other questions patient stated she does not and feels comfortable with going to SNF for short term rehab.   Employment status:  Retired Forensic scientist:  Medicare PT Recommendations:  [place] / Referral to community resources:  [place]  Patient/Family's Response to care:  Patient in agreement to going to SNF for short term rehab.  Patient/Family's Understanding of and Emotional Response to Diagnosis, Current Treatment, and Prognosis:  Patient is aware of current treatment and prognosis.  Emotional Assessment Appearance:  Appears stated age Attitude/Demeanor/Rapport:    Affect (typically observed):  Appropriate, Stable, Pleasant Orientation:  Oriented to Self, Oriented to Place, Oriented to  Time, Oriented to Situation Alcohol / Substance use:  Not Applicable Psych involvement (Current and /or in the community):  No (Comment)  Discharge Needs  Concerns to be addressed:  No discharge needs identified Readmission within the last 30 days:  No Current discharge risk:  None Barriers to Discharge:  No Barriers Identified   Ross Ludwig, LCSWA 09/29/2015, 6:15 PM

## 2015-09-29 NOTE — Progress Notes (Signed)
Subjective: Patient reports Doing well condition of back pain but no leg pain  Objective: Vital signs in last 24 hours: Temp:  [97.8 F (36.6 C)-99.3 F (37.4 C)] 99 F (37.2 C) (01/05 [id number]) Pulse Rate:  [93-106] 93 (01/05 0613) Resp:  [15-25] 18 (01/05 0613) BP: (113-168)/(68-94) 113/72 mmHg (01/05 0613) SpO2:  [93 %-100 %] 95 % (01/05 [id number])  Intake/Output from previous day: 01/04 0701 - 01/05 0700 In: 2480 [I.V.:2000; Blood:230; IV Piggyback:250] Out: [id number] [Urine:1000; Drains:15; Blood:700] Intake/Output this shift:    Awake alert strength out of 5 wound clean dry and intact  Lab Results: No results for input(s): WBC, HGB, HCT, PLT in the last 72 hours. BMET No results for input(s): NA, K, CL, CO2, GLUCOSE, BUN, CREATININE, CALCIUM in the last 72 hours.  Studies/Results: Dg Lumbar Spine 2-3 Views  09/28/2015  CLINICAL DATA:  L1-2 PLIF EXAM: DG C-ARM 61-120 MIN; LUMBAR SPINE - 2-3 VIEW COMPARISON:  09/04/2013 FLUOROSCOPY TIME:  Radiation Exposure Index (as provided by the fluoroscopic device): Not available If the device does not provide the exposure index: Fluoroscopy Time:  28 seconds Number of Acquired Images:  2 FINDINGS: Prior fusion is again noted at L3-4. Pedicle screws have been removed at L4 an new pedicle screws placed at L1 with interbody fusion at L1-2. IMPRESSION: L1-2 PLIF Electronically Signed   By: Inez Catalina M.D.   On: 09/28/2015 13:14   Dg C-arm 1-60 Min  09/28/2015  CLINICAL DATA:  L1-2 PLIF EXAM: DG C-ARM 61-120 MIN; LUMBAR SPINE - 2-3 VIEW COMPARISON:  09/04/2013 FLUOROSCOPY TIME:  Radiation Exposure Index (as provided by the fluoroscopic device): Not available If the device does not provide the exposure index: Fluoroscopy Time:  28 seconds Number of Acquired Images:  2 FINDINGS: Prior fusion is again noted at L3-4. Pedicle screws have been removed at L4 an new pedicle screws placed at L1 with interbody fusion at L1-2. IMPRESSION: L1-2 PLIF Electronically Signed    By: Inez Catalina M.D.   On: 09/28/2015 13:14    Assessment/Plan: Mobilized today with physical and outpatient therapy. Patient will more than likely need to be placed in rehabilitation suitcase worker should meet with the patient today to discuss  LOS: 1 day     CRAM,GARY P 09/29/2015, 7:42 AM

## 2015-09-29 NOTE — Evaluation (Signed)
Physical Therapy Evaluation Patient Details Name: Amy [name] MRN: [id number] DOB: 1945-10-06 Today's Date: 09/29/2015   History of Present Illness  s/p posterior lumbar fusion 1 level; PMH includes fibromyalsia, chronic back pain, SOB on exertion, h/o previous back surgeries and left shoulder open rotator cuff repair    Clinical Impression  Pt admitted with above diagnosis. Pt currently with functional limitations due to the deficits listed below (see PT Problem List). Pt limited by significant pain at this time. Pt has limited support at home due to her husband working and daughter with medical issues and unable to provide assist. Pt will benefit from skilled PT to increase her independence and safety with mobility to allow discharge to the venue listed below for short term rehab prior to d/c home.       Follow Up Recommendations Supervision/Assistance - 24 hour;SNF (Pt requesting [facility])    Equipment Recommendations  None recommended by PT    Recommendations for Other Services       Precautions / Restrictions Precautions Precautions: Back;Fall Precaution Comments: educated on BAT precautions Required Braces or Orthoses: Spinal Brace Spinal Brace: Lumbar corset;Applied in sitting position Restrictions Weight Bearing Restrictions: No      Mobility  Bed Mobility Overal bed mobility: Needs Assistance Bed Mobility: Rolling;Sidelying to Sit Rolling: Supervision Sidelying to sit: Supervision       General bed mobility comments: Use of rails and significant amount of extra time due to pain  Transfers Overall transfer level: Needs assistance Equipment used: Rolling walker (2 wheeled) Transfers: Sit to/from [facility] Sit to Stand: Min assist Stand pivot transfers: Min guard       General transfer comment: Pt performed sit to stand from bed with min assist and cues for hand placement. Transferred to [facility] with min guard assist and then  from [facility] to recliner. Pt requires extra time for all transitional movements due to increased pain but able to do with overall min assist  Ambulation/Gait Ambulation/Gait assistance: Min assist Ambulation Distance (Feet): 5 Feet Assistive device: Rolling walker (2 wheeled)       General Gait Details: requires further assessment. Pt did not want to gait further this session due to pain levels.  Stairs            Wheelchair Mobility    Modified Rankin (Stroke Patients Only)       Balance Overall balance assessment: Needs assistance Sitting-balance support: Feet supported;No upper extremity supported Sitting balance-Leahy Scale: Fair     Standing balance support: Single extremity supported;During functional activity Standing balance-Leahy Scale: Fair Standing balance comment: pt performed perineal hygiene in standing using RW for support                             Pertinent Vitals/Pain Pain Assessment: 0-10 Pain Score: 10-Worst pain ever Pain Location: back Pain Descriptors / Indicators: Aching;Guarding;Sore Pain Intervention(s): Limited activity within patient's tolerance;Monitored during session;Ice applied;Premedicated before session    Home Living Family/patient expects to be discharged to:: Skilled nursing facility Living Arrangements: Spouse/significant other               Additional Comments: Pt reports she had no assistance during the day as spouse works and her daughter has medical issues herself    Prior Function Level of Independence: Independent with assistive device(s)         Comments: Pt has canes that she keeps nearby "in case I need them." Pt  reports she often uses furniture at home to stabilize balance while walking.     Hand Dominance        Extremity/Trunk Assessment   Upper Extremity Assessment: Defer to OT evaluation           Lower Extremity Assessment: Generalized weakness      Cervical / Trunk  Assessment: Other exceptions  Communication   Communication: No difficulties  Cognition Arousal/Alertness: Awake/alert Behavior During Therapy: WFL for tasks assessed/performed Overall Cognitive Status: Within Functional Limits for tasks assessed                      General Comments      Exercises        Assessment/Plan    PT Assessment Patient needs continued PT services  PT Diagnosis Difficulty walking;Generalized weakness;Acute pain   PT Problem List Decreased strength;Decreased range of motion;Decreased activity tolerance;Decreased balance;Decreased mobility;Decreased knowledge of use of DME;Decreased knowledge of precautions;Pain  PT Treatment Interventions DME instruction;Gait training;Stair training;Functional mobility training;Therapeutic activities;Therapeutic exercise;Balance training;Neuromuscular re-education;Patient/family education;Modalities   PT Goals (Current goals can be found in the Care Plan section) Acute Rehab PT Goals Patient Stated Goal: SNF for rehab then home PT Goal Formulation: With patient Time For Goal Achievement: 10/13/15 Potential to Achieve Goals: Good    Frequency Min 5X/week   Barriers to discharge Decreased caregiver support Pt does not have support as her husband works. Daughter living with her has own medical issues.     Co-evaluation               End of Session Equipment Utilized During Treatment: Back brace Activity Tolerance: Patient limited by pain Patient left: in chair;with call bell/phone within reach Nurse Communication: Mobility status         Time: [id number] PT Time Calculation (min) (ACUTE ONLY): 27 min   Charges:   PT Evaluation $PT Eval Moderate Complexity: 1 Procedure PT Treatments $Therapeutic Activity: 8-22 mins   PT G Codes:        [name] [name], PT, DPT  09/29/2015, 2:47 PM

## 2015-09-29 NOTE — Progress Notes (Signed)
PT Cancellation Note  Patient Details Name: Amy [name] MRN: [id number] DOB: 15-Apr-1946   Cancelled Treatment:    Reason Eval/Treat Not Completed: Other (comment);Fatigue/lethargy limiting ability to participate. Pt reports just receiving pain medication and difficulty with maintaining eyes open/following conversation with therapist. Will attempt evaluation later this afternoon if schedule allows and pt able to participate. Pt reports she is planning to d/c to [facility] for further rehab prior to d/c home as she has been there before.   [name] [name], PT, DPT Pager #: [phone]  09/29/2015, 11:13 AM

## 2015-09-29 NOTE — Progress Notes (Signed)
Occupational Therapy Evaluation Patient Details Name: Amy [name] MRN: [id number] DOB: [birth year] Today's Date: 09/29/2015    History of Present Illness s/p posterior lumbar fusion 1 level; PMH includes fibromyalsia, chronic back pain, SOB on exertion, h/o previous back surgeries and left shoulder open rotator cuff repair   Clinical Impression   Pt admitted with the above diagnoses and presents with below problem list. Pt will benefit from continued acute OT to address the below listed deficits and maximize independence with BADLs prior to d/c to venue below. PTA pt was mod I to independent with ADLs. Pt is currently mod A for stand pivot transfers (max to total A to scoot hips); max A for LB ADLs, mod A for UB ADLs. Pt limited by "25"/10 pain this session. Session details below. OT to continue to follow acutely.      Follow Up Recommendations  SNF    Equipment Recommendations  Other (comment) (defer to next venue)    Recommendations for Other Services PT consult     Precautions / Restrictions Precautions Precautions: Back;Fall Precaution Booklet Issued: Yes (comment) Precaution Comments: educated on BAT precautions Required Braces or Orthoses: Spinal Brace Spinal Brace: Lumbar corset;Applied in sitting position Restrictions Weight Bearing Restrictions: No      Mobility Bed Mobility Overal bed mobility: Needs Assistance Bed Mobility: Sit to Sidelying         Sit to sidelying: Mod assist General bed mobility comments: HOB flat, rails down. Mod A to advance BLE onto bed and to maintain precautions during transition.  Transfers Overall transfer level: Needs assistance Equipment used: Rolling walker (2 wheeled) Transfers: Sit to/from [facility] Sit to Stand: Mod assist Stand pivot transfers: Mod assist       General transfer comment: Pt completed 2 sit<>stands from recliner. Max to total assist to scoot hips forward into prestance  position. First sit<>stand completed with min +2 physical assist. Pt stood for about 1 minute. After 2-3 minunte rest break pt completed second sit<>stnad this time with mod A provided. Suspect pt needed more assist on initial stand due to being in recliner for over 2 hours per her report. SPT with mod A. Pt anxious and in pain but motivated.     Balance Overall balance assessment: Needs assistance Sitting-balance support: No upper extremity supported;Feet supported Sitting balance-Leahy Scale: Fair Sitting balance - Comments: able to doff brace in sitting position. Pt preferred position of BUE likely due to pain.   Standing balance support: Bilateral upper extremity supported;During functional activity Standing balance-Leahy Scale: Poor Standing balance comment: stood about 1 minute in static standing, completed SPT. External support needed. Rw utilized                            ADL Overall ADL's : Needs assistance/impaired Eating/Feeding: Set up;Sitting   Grooming: Set up;Sitting   Upper Body Bathing: Sitting;Moderate assistance   Lower Body Bathing: Maximal assistance;Sit to/from stand   Upper Body Dressing : Moderate assistance;Sitting   Lower Body Dressing: Maximal assistance;Sit to/from stand   Toilet Transfer: Moderate assistance;Stand-pivot;RW   Toileting- Clothing Manipulation and Hygiene: Maximal assistance;Sit to/from stand   Tub/ Banker: Moderate assistance;Stand-pivot;3 in 1;Rolling walker     General ADL Comments: Pt completed 2 sit<>stands from recliner. Initially pt was +2 min A to stand. Second attempt pt completed with mod A. Pt reporting being in recliner for over 2 hours. ADL education provided including handout. Bed mobility completed as  detailed below. Pt declined transfer to [facility]. Completed SPT from recliner to EOB with mod A.      Vision     Perception     Praxis      Pertinent Vitals/Pain Pain Assessment: 0-10 Pain Score:  10-Worst pain ever ("25") Pain Location: back Pain Descriptors / Indicators: Aching;Guarding;Moaning;Sore Pain Intervention(s): Limited activity within patient's tolerance;Monitored during session;Premedicated before session;Repositioned;Utilized relaxation techniques;Ice applied     Hand Dominance     Extremity/Trunk Assessment Upper Extremity Assessment Upper Extremity Assessment: Generalized weakness   Lower Extremity Assessment Lower Extremity Assessment: Defer to PT evaluation       Communication Communication Communication: No difficulties   Cognition Arousal/Alertness: Awake/alert Behavior During Therapy: WFL for tasks assessed/performed Overall Cognitive Status: Within Functional Limits for tasks assessed                     General Comments       Exercises       Shoulder Instructions      Home Living Family/patient expects to be discharged to:: Skilled nursing facility Living Arrangements: Spouse/significant other                               Additional Comments: Pt reports she had no assistance during the day as spouse works.      Prior Functioning/Environment Level of Independence: Independent with assistive device(s)        Comments: Pt has canes that she keeps nearby "in case I need them." Pt reports she often uses furniture at home to stabilize balance while walking.    OT Diagnosis: Generalized weakness;Acute pain   OT Problem List: Decreased strength;Impaired balance (sitting and/or standing);Decreased activity tolerance;Decreased knowledge of use of DME or AE;Decreased knowledge of precautions;Obesity;Pain   OT Treatment/Interventions: Self-care/ADL training;Energy conservation;DME and/or AE instruction;Therapeutic activities;Patient/family education;Balance training    OT Goals(Current goals can be found in the care plan section) Acute Rehab OT Goals Patient Stated Goal: SNF for rehab then home OT Goal Formulation: With  patient Time For Goal Achievement: 10/06/15 Potential to Achieve Goals: Good ADL Goals Pt Will Perform Grooming: with modified independence;sitting;standing;with adaptive equipment Pt Will Perform Upper Body Bathing: with modified independence;sitting Pt Will Perform Lower Body Bathing: with min guard assist;with adaptive equipment;sit to/from stand Pt Will Perform Upper Body Dressing: with modified independence;sitting Pt Will Perform Lower Body Dressing: with min guard assist;sit to/from stand;with adaptive equipment Pt Will Transfer to Toilet: with min guard assist;ambulating;bedside commode Pt Will Perform Toileting - Clothing Manipulation and hygiene: with set-up;with adaptive equipment;sitting/lateral leans;sit to/from stand;with min guard assist Pt Will Perform Tub/Shower Transfer: with min guard assist;Tub transfer;tub bench;ambulating;rolling walker Additional ADL Goal #1: Pt will complete bed mobility with min guard assist to prepare for OOB ADLs.   OT Frequency: Min 2X/week   Barriers to D/C:            Co-evaluation              End of Session Equipment Utilized During Treatment: Gait belt;Rolling walker;Back brace Nurse Communication: Other (comment);Mobility status (pain level, mouth discomfort)  Activity Tolerance: Patient limited by pain Patient left: in bed;with call bell/phone within reach;with SCD's reapplied   Time: [id number] OT Time Calculation (min): 42 min Charges:  OT General Charges $OT Visit: 1 Procedure OT Evaluation $OT Eval Low Complexity: 1 Procedure OT Treatments $Self Care/Home Management : 8-22 mins G-Codes:    Hortencia Pilar  09/29/2015, 10:32 AM

## 2015-09-29 NOTE — Progress Notes (Signed)
Pt was able to transfer from bed to chair with no complications. Will continue to monitor pt.

## 2015-09-29 NOTE — NC FL2 (Signed)
[place] MEDICAID FL2 LEVEL OF CARE SCREENING TOOL     IDENTIFICATION  Patient Name: Amy [name] Birthdate: 03-31-1946 Sex: female Admission Date (Current Location): 09/28/2015  [facility] and [place] Number:  Herbalist and Address:  [facility] [place] [address] [place], [place] [address]      Provider Number: [id number]  Attending Physician Name and Address:  Kary Kos, MD  Relative Name and Phone Number:  Johnell Comings [phone] or [phone]    Current Level of Care: Hospital Recommended Level of Care: [facility] Prior Approval Number:    Date Approved/Denied:   PASRR Number: [id number]  Discharge Plan: SNF    Current Diagnoses: Patient Active Problem List   Diagnosis Date Noted  . Complete rotator cuff tear of left shoulder 05/05/2014  . Depression 08/06/2013  . Hypokalemia 08/06/2013  . GERD (gastroesophageal reflux disease) 08/06/2013  . Spinal stenosis of lumbar region 08/06/2013  . Bladder spasm 08/06/2013  . Arthritis 08/06/2013  . Obesity, Class III, BMI 40-49.9 (morbid obesity) ([place]) 02/03/2013  . Lipoma of buttock s/p excision 02/20/2013 02/03/2013  . DYSPNEA 01/23/2008  . COUGH 01/23/2008  . OBSTRUCTIVE SLEEP APNEA 09/12/2007  . HYPERTENSION 09/12/2007  . ALLERGIC RHINITIS 09/12/2007  . SLEEPINESS 09/12/2007  . ANGINA, HX OF 09/12/2007    Orientation RESPIRATION BLADDER Height & Weight    Self, Time, Situation, Place  Normal Continent 5' 5.5" (166.4 cm) 273 lbs.  BEHAVIORAL SYMPTOMS/MOOD NEUROLOGICAL BOWEL NUTRITION STATUS      Continent    AMBULATORY STATUS COMMUNICATION OF NEEDS Skin   Limited Assist Verbally Surgical wounds                       Personal Care Assistance Level of Assistance  Bathing, Dressing Bathing Assistance: Limited assistance   Dressing Assistance: Limited assistance     Functional Limitations Info             SPECIAL CARE  FACTORS FREQUENCY  PT (By licensed PT), OT (By licensed OT)     PT Frequency: 5x a week OT Frequency: 5x a week            Contractures      Additional Factors Info  Allergies   Allergies Info: LATEX, PENICILLINS           Current Medications (09/29/2015):  This is the current hospital active medication list Current Facility-Administered Medications  Medication Dose Route Frequency Provider Last Rate Last Dose  . 0.9 %  sodium chloride infusion  250 mL Intravenous Continuous Kary Kos, MD   Stopped at 09/28/15 1837  . acetaminophen (TYLENOL) tablet 650 mg  650 mg Oral Q4H PRN Kary Kos, MD       Or  . acetaminophen (TYLENOL) suppository 650 mg  650 mg Rectal Q4H PRN Kary Kos, MD      . B-complex with vitamin C tablet 1 tablet  1 tablet Oral Daily Kary Kos, MD   1 tablet at 09/29/15 1034  . bupivacaine liposome (EXPAREL) 1.3 % injection 266 mg  20 mL Infiltration Once Kary Kos, MD      . calcium-vitamin D (OSCAL WITH D) 500-200 MG-UNIT per tablet 1 tablet  1 tablet Oral Q breakfast Kary Kos, MD   1 tablet at 09/29/15 0817  . cyclobenzaprine (FLEXERIL) tablet 10 mg  10 mg Oral TID PRN Kary Kos, MD   10 mg at 09/29/15 1033  . diphenhydrAMINE (BENADRYL) capsule  25 mg  25 mg Oral BID PRN Kary Kos, MD      . docusate sodium (COLACE) capsule 100 mg  100 mg Oral BID Kary Kos, MD   100 mg at 09/29/15 1033  . estradiol (ESTRACE) vaginal cream 1 Applicatorful  1 Applicatorful Vaginal Once per day on Mon Thu Kary Kos, MD   1 Applicatorful at 0000000 1050  . gabapentin (NEURONTIN) capsule 300 mg  300 mg Oral QHS Kary Kos, MD   300 mg at 09/28/15 2225  . glycopyrrolate (ROBINUL) tablet 2 mg  2 mg Oral BID Kary Kos, MD   2 mg at 09/29/15 1033  . hydrochlorothiazide (HYDRODIURIL) tablet 25 mg  25 mg Oral Daily Kary Kos, MD   25 mg at 09/29/15 1035  . HYDROcodone-acetaminophen (NORCO/VICODIN) 5-325 MG per tablet 1 tablet  1 tablet Oral Q6H PRN Kary Kos, MD      . HYDROmorphone  (DILAUDID) injection 0.5-1 mg  0.5-1 mg Intravenous Q2H PRN Kary Kos, MD   1 mg at 09/29/15 1035  . l-methylfolate-B6-B12 (METANX) 3-35-2 MG per tablet 1 tablet  1 tablet Oral BID Kary Kos, MD   1 tablet at 09/29/15 1034  . losartan (COZAAR) tablet 100 mg  100 mg Oral Daily Kary Kos, MD   100 mg at 09/29/15 1035  . menthol-cetylpyridinium (CEPACOL) lozenge 3 mg  1 lozenge Oral PRN Kary Kos, MD   3 mg at 09/28/15 1746   Or  . phenol (CHLORASEPTIC) mouth spray 1 spray  1 spray Mouth/Throat PRN Kary Kos, MD   1 spray at 09/28/15 2355  . multivitamin with minerals tablet 1 tablet  1 tablet Oral Daily Kary Kos, MD   1 tablet at 09/29/15 1034  . ondansetron (ZOFRAN) injection 4 mg  4 mg Intravenous Q4H PRN Kary Kos, MD      . oxyCODONE-acetaminophen (PERCOCET/ROXICET) 5-325 MG per tablet 1-2 tablet  1-2 tablet Oral Q4H PRN Kary Kos, MD   2 tablet at 09/29/15 1538  . pantoprazole (PROTONIX) EC tablet 40 mg  40 mg Oral Daily Kary Kos, MD   40 mg at 09/29/15 1035  . PARoxetine (PAXIL) tablet 30 mg  30 mg Oral Daily Kary Kos, MD   30 mg at 09/29/15 1034  . polyethylene glycol (MIRALAX / GLYCOLAX) packet 17 g  17 g Oral Daily PRN Kary Kos, MD      . potassium chloride SA (K-DUR,KLOR-CON) CR tablet 20 mEq  20 mEq Oral Daily Kary Kos, MD   20 mEq at 09/29/15 1035  . sodium chloride 0.9 % injection 3 mL  3 mL Intravenous Q12H Kary Kos, MD   3 mL at 09/28/15 2225  . sodium chloride 0.9 % injection 3 mL  3 mL Intravenous PRN Kary Kos, MD      . tamsulosin [facility]) capsule 0.4 mg  0.4 mg Oral Daily Kary Kos, MD   0.4 mg at 09/29/15 1035  . vancomycin (VANCOCIN) IVPB 1000 mg/200 mL premix  1,000 mg Intravenous Q12H Lyndee Leo, RPH   1,000 mg at 09/29/15 1035  . vitamin B-12 (CYANOCOBALAMIN) tablet 1,000 mcg  1,000 mcg Oral QHS Kary Kos, MD   1,000 mcg at 09/28/15 2225  . vitamin E capsule 400 Units  400 Units Oral Daily Kary Kos, MD   400 Units at 09/29/15 1034     Discharge  Medications: Please see discharge summary for a list of discharge medications.  Relevant Imaging Results:  Relevant Lab Results:  Additional Information SSN [id number]  Ross Ludwig, [place]

## 2015-09-29 NOTE — Clinical Social Work Placement (Signed)
   CLINICAL SOCIAL WORK PLACEMENT  NOTE  Date:  09/29/2015  Patient Details  Name: Amy [name] MRN: [id number] Date of Birth: March 16, 1946  Clinical Social Work is seeking post-discharge placement for this patient at the [place] level of care (*CSW will initial, date and re-position this form in  chart as items are completed):  Yes   Patient/family provided with [place] Work Department's list of facilities offering this level of care within the geographic area requested by the patient (or if unable, by the patient's family).  Yes   Patient/family informed of their freedom to choose among providers that offer the needed level of care, that participate in Medicare, Medicaid or managed care program needed by the patient, have an available bed and are willing to accept the patient.  Yes   Patient/family informed of [facility]'s ownership interest in [facility] and [facility], as well as of the fact that they are under no obligation to receive care at these facilities.  PASRR submitted to EDS on 09/29/15     PASRR number received on       Existing PASRR number confirmed on 09/29/15     FL2 transmitted to all facilities in geographic area requested by pt/family on 09/29/15     FL2 transmitted to all facilities within larger geographic area on 09/29/15     Patient informed that his/her managed care company has contracts with or will negotiate with certain facilities, including the following:            Patient/family informed of bed offers received.  Patient chooses bed at       Physician recommends and patient chooses bed at      Patient to be transferred to   on  .  Patient to be transferred to facility by       Patient family notified on   of transfer.  Name of family member notified:        PHYSICIAN Please sign FL2     Additional Comment:    _______________________________________________ Ross Ludwig,  LCSWA 09/29/2015, 6:44 PM

## 2015-09-30 MED ORDER — MAGIC MOUTHWASH: 5.0000 mL | Freq: Three times a day (TID) | ORAL | Status: DC | PRN

## 2015-09-30 MED ORDER — VANCOMYCIN HCL IN DEXTROSE 1-5 GM/200ML-% IV SOLN
1000.0000 mg | Freq: Two times a day (BID) | INTRAVENOUS | Status: AC
  Administered 2015-09-30 – 2015-10-01 (×2): 1000 mg via INTRAVENOUS
  Filled 2015-09-30 (×2): qty 200

## 2015-09-30 MED ORDER — POLYETHYLENE GLYCOL 3350 17 G PO PACK
17.0000 g | PACK | Freq: Every day | ORAL | Status: DC
  Administered 2015-09-30 – 2015-10-02 (×3): 17 g via ORAL
  Filled 2015-09-30 (×3): qty 1

## 2015-09-30 NOTE — Care Management Note (Signed)
Case Management Note  Patient Details  Name: Amy [name] MRN: [id number] Date of Birth: 27-Jan-1946  Subjective/Objective:       Patient admitted to have lumbar surgery. She is from home with her husband.              Action/Plan: Plan is for SNF for rehab. CM will continue to follow for discharge needs.   Expected Discharge Date:                  Expected Discharge Plan:  [address]  In-House Referral:     Discharge planning Services     Post Acute Care Choice:    Choice offered to:     DME Arranged:    DME Agency:     HH Arranged:    [place] Agency:     Status of Service:  In process, will continue to follow  Medicare Important Message Given:  Yes Date Medicare IM Given:    Medicare IM give by:    Date Additional Medicare IM Given:    Additional Medicare Important Message give by:     If discussed at [place] of Stay Meetings, dates discussed:    Additional Comments:  Pollie Friar, RN 09/30/2015, 4:04 PM

## 2015-09-30 NOTE — Clinical Social Work Note (Signed)
CSW spoke to [facility] who said they can take patient once she is medically ready for discharge.  CSW to continue to follow patient's progress.  Jones Broom. Vivian, MSW, [place] 09/30/2015 4:33 PM

## 2015-09-30 NOTE — Progress Notes (Signed)
Patient ID: Amy [name], female   DOB: 12-May-1946, 70 y.o.   MRN: [id number] Doing well condition of sore throat and back pain but no leg pain  Strength 5 out of 5 wound clean dry and intact  Continue progressive mobilization with physical occupational therapy work towards placement discharge planning will start her on Magic mouthwash for sore throat. We'll consider removing her Hemovac later on today.

## 2015-09-30 NOTE — Progress Notes (Signed)
Occupational Therapy Treatment Patient Details Name: Amy [name] MRN: [id number] DOB: 1945-12-14 Today's Date: 09/30/2015    History of present illness s/p posterior lumbar fusion 1 level; PMH includes fibromyalsia, chronic back pain, SOB on exertion, h/o previous back surgeries and left shoulder open rotator cuff repair   OT comments  Pt making good progress towards occupational therapy goals. Pt required min-min guard assist for all mobility and ADLs. Reviewed back precautions and AE for LB ADLs. Will continue to follow acutely.   Follow Up Recommendations  SNF    Equipment Recommendations  Other (comment) (TBD in next venue)    Recommendations for Other Services      Precautions / Restrictions Precautions Precautions: Back;Fall Precaution Booklet Issued: No Precaution Comments: Reviewed precautions - pt able to recall 3/3  Required Braces or Orthoses: Spinal Brace Spinal Brace: Lumbar corset;Applied in sitting position Restrictions Weight Bearing Restrictions: No       Mobility Bed Mobility Overal bed mobility: Needs Assistance Bed Mobility: Rolling;Sidelying to Sit;Sit to Sidelying Rolling: Min guard Sidelying to sit: Min guard     Sit to sidelying: Min guard General bed mobility comments: HOB flat, use of bedarils. Increased time and effort. Min guard for safety due to increased pain and hemovac  Transfers Overall transfer level: Needs assistance Equipment used: Rolling walker (2 wheeled) Transfers: Sit to/from Stand Sit to Stand: Min assist         General transfer comment: Sit-stand transfers x3 with min assist for boost to stand. Good demonstration of safe hand placement on seated surfaces. No LOB - pt steady on feet just moving at extremely slow pace.     Balance Overall balance assessment: Needs assistance Sitting-balance support: No upper extremity supported;Feet supported Sitting balance-Leahy Scale: Fair Sitting balance - Comments:  Able to weight shift minimally to remove gown from underneath her   Standing balance support: Bilateral upper extremity supported;During functional activity Standing balance-Leahy Scale: Fair Standing balance comment: Steady on feet, no LOB. Able to balance without UE to complete pericare                   ADL Overall ADL's : Needs assistance/impaired     Grooming: Wash/dry hands;Wash/dry face;Set up;Sitting   Upper Body Bathing: Set up;Sitting   Lower Body Bathing: Minimal assistance;With adaptive equipment;Sit to/from stand   Upper Body Dressing : Min guard;Sitting Upper Body Dressing Details (indicate cue type and reason): to do/don back brace and hospital gown Lower Body Dressing: Minimal assistance;With adaptive equipment;Cueing for compensatory techniques;Adhering to back precautions;Sit to/from stand Lower Body Dressing Details (indicate cue type and reason): Cues for proper AE use and to avoid bending Toilet Transfer: Minimal assistance;Cueing for sequencing;Ambulation;BSC;RW Toilet Transfer Details (indicate cue type and reason): Verbal cues to feel BSC on back of legs and reach back with 1 hand to avoid twisting Toileting- Clothing Manipulation and Hygiene: Moderate assistance;Sit to/from stand Toileting - Clothing Manipulation Details (indicate cue type and reason): For pericare from behind     Functional mobility during ADLs: Minimal assistance;Cueing for safety;Rolling walker General ADL Comments: Reviewed and practiced LB ADLs with AE. Pt required min-min guard assist for most mobility but still moving at very slow pace.       Vision                     Perception     Praxis      Cognition   Behavior During Therapy: [facility] for tasks assessed/performed Overall Cognitive  Status: Within Functional Limits for tasks assessed                       Extremity/Trunk Assessment               Exercises     Shoulder Instructions        General Comments      Pertinent Vitals/ Pain       Pain Assessment: 0-10 Pain Score: 10-Worst pain ever ("25") Pain Location: back Pain Descriptors / Indicators: Aching;Sore;Operative site guarding Pain Intervention(s): Limited activity within patient's tolerance;Monitored during session;Repositioned;Ice applied  Home Living                                          Prior Functioning/Environment              Frequency Min 2X/week     Progress Toward Goals  OT Goals(current goals can now be found in the care plan section)  Progress towards OT goals: Progressing toward goals  Acute Rehab OT Goals Patient Stated Goal: SNF for rehab then home OT Goal Formulation: With patient Time For Goal Achievement: 10/06/15 Potential to Achieve Goals: Good ADL Goals Pt Will Perform Grooming: with modified independence;sitting;standing;with adaptive equipment Pt Will Perform Upper Body Bathing: with modified independence;sitting Pt Will Perform Lower Body Bathing: with min guard assist;with adaptive equipment;sit to/from stand Pt Will Perform Upper Body Dressing: with modified independence;sitting Pt Will Perform Lower Body Dressing: with min guard assist;sit to/from stand;with adaptive equipment Pt Will Transfer to Toilet: with min guard assist;ambulating;bedside commode Pt Will Perform Toileting - Clothing Manipulation and hygiene: with set-up;with adaptive equipment;sitting/lateral leans;sit to/from stand;with min guard assist Pt Will Perform Tub/Shower Transfer: with min guard assist;Tub transfer;tub bench;ambulating;rolling walker Additional ADL Goal #1: Pt will complete bed mobility with min guard assist to prepare for OOB ADLs.   Plan Discharge plan remains appropriate    Co-evaluation                 End of Session Equipment Utilized During Treatment: Gait belt;Rolling walker;Back brace   Activity Tolerance Patient limited by pain   Patient Left in  bed;with call bell/phone within reach;with bed alarm set;with nursing/sitter in room   Nurse Communication Mobility status;Precautions        Time: [id number] OT Time Calculation (min): 55 min  Charges: OT General Charges $OT Visit: 1 Procedure OT Treatments $Self Care/Home Management : 53-67 mins  Redmond Baseman, OTR/L Pager: [phone] 09/30/2015, 3:46 PM

## 2015-09-30 NOTE — Care Management Important Message (Signed)
Important Message  Patient Details  Name: ANGELINA RUTHERFORD MRN: [id number] Date of Birth: [birth year]   Medicare Important Message Given:  Yes    Megan P Shular 09/30/2015, 2:28 PM

## 2015-09-30 NOTE — Progress Notes (Signed)
PT Cancellation Note  Patient Details Name: Amy [name] MRN: [id number] DOB: [birth year]   Cancelled Treatment:    Reason Eval/Treat Not Completed: Patient at procedure or test/unavailable. Pt finishing OT session and just returning to bed. She requested no PT at this time.   SASSER,LYNN 09/30/2015, 3:26 PM  Pager [phone]

## 2015-10-01 MED ORDER — IPRATROPIUM-ALBUTEROL 0.5-2.5 (3) MG/3ML IN SOLN
3.0000 mL | Freq: Four times a day (QID) | RESPIRATORY_TRACT | Status: DC | PRN
  Administered 2015-10-02 (×2): 3 mL via RESPIRATORY_TRACT
  Filled 2015-10-01 (×2): qty 3

## 2015-10-01 MED ORDER — IPRATROPIUM-ALBUTEROL 0.5-2.5 (3) MG/3ML IN SOLN
3.0000 mL | Freq: Four times a day (QID) | RESPIRATORY_TRACT | Status: DC
  Administered 2015-10-01: 3 mL via RESPIRATORY_TRACT
  Filled 2015-10-01: qty 3

## 2015-10-01 MED ORDER — DM-GUAIFENESIN ER 30-600 MG PO TB12
1.0000 | ORAL_TABLET | Freq: Two times a day (BID) | ORAL | Status: DC
  Administered 2015-10-01 – 2015-10-03 (×4): 1 via ORAL
  Filled 2015-10-01 (×4): qty 1

## 2015-10-01 NOTE — Progress Notes (Signed)
Patient ID: Amy [name], female   DOB: 1945/12/18, 70 y.o.   MRN: [id number] Patient doing well no leg pain back pain well-controlled  Strength out of 5 wound clean dry and intact  Patient awaiting placement at [facility] patient is medically cleared for discharge however bed will probably not be available this weekend but the patient may go on Monday

## 2015-10-01 NOTE — Progress Notes (Signed)
Physical Therapy Treatment Patient Details Name: Amy [name] MRN: [id number] DOB: 07-Apr-1946 Today's Date: 10/01/2015    History of Present Illness s/p posterior lumbar fusion 1 level; PMH includes fibromyalsia, chronic back pain, SOB on exertion, h/o previous back surgeries and left shoulder open rotator cuff repair    PT Comments    Pt progressing towards physical therapy goals. Pt was able to perform transfers and ambulation with min guard to min assist, and demonstrated good use of the RW. Occasional cues required for safety and maintenance of back precautions. Will continue to follow and progress as able per POC.   Follow Up Recommendations  Supervision/Assistance - 24 hour;SNF     Equipment Recommendations  None recommended by PT    Recommendations for Other Services       Precautions / Restrictions Precautions Precautions: Back;Fall Precaution Comments: Reviewed back precautions during functional mobility.  Required Braces or Orthoses: Spinal Brace Spinal Brace: Lumbar corset;Applied in sitting position Restrictions Weight Bearing Restrictions: No    Mobility  Bed Mobility Overal bed mobility: Needs Assistance Bed Mobility: Rolling;Sidelying to Sit Rolling: Min guard Sidelying to sit: Min assist       General bed mobility comments: HOB slightly elevated. Pt was able to transition to EOB with min assist for trunk support. Increased time required for scooting.   Transfers Overall transfer level: Needs assistance Equipment used: Rolling walker (2 wheeled) Transfers: Sit to/from Stand Sit to Stand: Min assist         General transfer comment: Pt able to initiate, but required assist to complete power-up to full stand. VC's for hand placement on seated surface for safety.  Ambulation/Gait Ambulation/Gait assistance: Min guard Ambulation Distance (Feet): 60 Feet Assistive device: Rolling walker (2 wheeled) Gait Pattern/deviations: Step-through  pattern;Decreased stride length;Wide base of support Gait velocity: Decreased Gait velocity interpretation: Below normal speed for age/gender General Gait Details: Generally wide BOS with ER noted in both LE's. Encouraged pt for increased distance, and although pt was hesitant, she was able to walk in the hall.    Stairs            Wheelchair Mobility    Modified Rankin (Stroke Patients Only)       Balance Overall balance assessment: Needs assistance Sitting-balance support: Feet supported;No upper extremity supported Sitting balance-Leahy Scale: Fair     Standing balance support: No upper extremity supported;During functional activity Standing balance-Leahy Scale: Fair Standing balance comment: Pt able to stand at sink and wash hands without UE support.                     Cognition Arousal/Alertness: Awake/alert Behavior During Therapy: WFL for tasks assessed/performed Overall Cognitive Status: Within Functional Limits for tasks assessed                      Exercises      General Comments        Pertinent Vitals/Pain Pain Assessment: Faces Faces Pain Scale: Hurts little more Pain Location: Incision Pain Descriptors / Indicators: Operative site guarding;Discomfort Pain Intervention(s): Limited activity within patient's tolerance;Monitored during session;Repositioned    Home Living                      Prior Function            PT Goals (current goals can now be found in the care plan section) Acute Rehab PT Goals Patient Stated Goal: SNF for rehab then home  PT Goal Formulation: With patient Time For Goal Achievement: 10/13/15 Potential to Achieve Goals: Good Progress towards PT goals: Progressing toward goals    Frequency  Min 5X/week    PT Plan Current plan remains appropriate    Co-evaluation             End of Session Equipment Utilized During Treatment: Back brace Activity Tolerance: Patient tolerated  treatment well Patient left: in chair;with call bell/phone within reach     Time: 0725-0756 PT Time Calculation (min) (ACUTE ONLY): 31 min  Charges:  $Gait Training: 23-37 mins                    G Codes:      Rolinda Roan 25-Oct-2015, 8:11 AM   Rolinda Roan, PT, DPT Acute Rehabilitation Services Pager: [phone]

## 2015-10-02 NOTE — Progress Notes (Signed)
No acute events AVSS Awake, alert Full strength bilateral lower extremities Incision clean, dry, intact Drain in place with high-output Stable Keep drain for now To SNF tomorrow

## 2015-10-02 NOTE — Progress Notes (Signed)
Physical Therapy Treatment Patient Details Name: Amy [name] MRN: [id number] DOB: 1946-01-18 Today's Date: 10/02/2015    History of Present Illness s/p posterior lumbar fusion 1 level; PMH includes fibromyalsia, chronic back pain, SOB on exertion, h/o previous back surgeries and left shoulder open rotator cuff repair    PT Comments    Patient making slow progress with mobility and gait.  Agree with need for SNF for continued therapy at discharge.  Follow Up Recommendations  SNF;Supervision/Assistance - 24 hour     Equipment Recommendations  None recommended by PT    Recommendations for Other Services       Precautions / Restrictions Precautions Precautions: Back;Fall Precaution Comments: Patient able to state 3/3 precautions Required Braces or Orthoses: Spinal Brace Spinal Brace: Lumbar corset;Applied in sitting position Restrictions Weight Bearing Restrictions: No    Mobility  Bed Mobility Overal bed mobility: Needs Assistance Bed Mobility: Sit to Sidelying         Sit to sidelying: Min assist General bed mobility comments: Verbal cues for technique.  Assist to bring LE's onto bed to return to sidelying.  Transfers Overall transfer level: Needs assistance Equipment used: Rolling walker (2 wheeled) Transfers: Sit to/from Stand Sit to Stand: Min assist         General transfer comment: Verbal cues for hand placement.  Min assist to rise to standing from recliner.  Assist to steady from 3-in-1  Ambulation/Gait Ambulation/Gait assistance: Min guard Ambulation Distance (Feet): 64 Feet Assistive device: Rolling walker (2 wheeled) Gait Pattern/deviations: Step-through pattern;Decreased step length - right;Decreased step length - left;Decreased stride length;Shuffle Gait velocity: Decreased Gait velocity interpretation: Below normal speed for age/gender General Gait Details: Verbal cues to stand upright and for safety.  Slow guarded gait, requiring  increased time..   Stairs            Wheelchair Mobility    Modified Rankin (Stroke Patients Only)       Balance                                    Cognition Arousal/Alertness: Awake/alert Behavior During Therapy: WFL for tasks assessed/performed Overall Cognitive Status: Within Functional Limits for tasks assessed                      Exercises      General Comments        Pertinent Vitals/Pain Pain Assessment: 0-10 Pain Score: 6  Pain Location: bil hips Pain Descriptors / Indicators: Sharp;Stabbing Pain Intervention(s): Monitored during session;Repositioned;Patient requesting pain meds-RN notified    Home Living                      Prior Function            PT Goals (current goals can now be found in the care plan section) Progress towards PT goals: Progressing toward goals    Frequency  Min 5X/week    PT Plan Current plan remains appropriate    Co-evaluation             End of Session Equipment Utilized During Treatment: Gait belt;Back brace Activity Tolerance: Patient limited by pain;Patient limited by fatigue Patient left: in bed;with call bell/phone within reach     Time: 1211-1236 PT Time Calculation (min) (ACUTE ONLY): 25 min  Charges:  $Gait Training: 23-37 mins  G Codes:      Despina Pole 10/02/2015, 3:58 PM Carita Pian. Sanjuana Kava, [place] Pager [phone]

## 2015-10-03 DIAGNOSIS — M549 Dorsalgia, unspecified: Secondary | ICD-10-CM | POA: Diagnosis not present

## 2015-10-03 DIAGNOSIS — G5791 Unspecified mononeuropathy of right lower limb: Secondary | ICD-10-CM | POA: Diagnosis not present

## 2015-10-03 DIAGNOSIS — R2681 Unsteadiness on feet: Secondary | ICD-10-CM | POA: Diagnosis not present

## 2015-10-03 DIAGNOSIS — Z4889 Encounter for other specified surgical aftercare: Secondary | ICD-10-CM | POA: Diagnosis not present

## 2015-10-03 DIAGNOSIS — M4726 Other spondylosis with radiculopathy, lumbar region: Secondary | ICD-10-CM | POA: Diagnosis not present

## 2015-10-03 DIAGNOSIS — M7071 Other bursitis of hip, right hip: Secondary | ICD-10-CM | POA: Diagnosis not present

## 2015-10-03 DIAGNOSIS — R490 Dysphonia: Secondary | ICD-10-CM | POA: Diagnosis not present

## 2015-10-03 DIAGNOSIS — D62 Acute posthemorrhagic anemia: Secondary | ICD-10-CM | POA: Diagnosis not present

## 2015-10-03 DIAGNOSIS — F329 Major depressive disorder, single episode, unspecified: Secondary | ICD-10-CM | POA: Diagnosis not present

## 2015-10-03 DIAGNOSIS — F411 Generalized anxiety disorder: Secondary | ICD-10-CM | POA: Diagnosis not present

## 2015-10-03 DIAGNOSIS — K5901 Slow transit constipation: Secondary | ICD-10-CM | POA: Diagnosis not present

## 2015-10-03 DIAGNOSIS — M792 Neuralgia and neuritis, unspecified: Secondary | ICD-10-CM | POA: Diagnosis not present

## 2015-10-03 DIAGNOSIS — K219 Gastro-esophageal reflux disease without esophagitis: Secondary | ICD-10-CM | POA: Diagnosis not present

## 2015-10-03 DIAGNOSIS — R278 Other lack of coordination: Secondary | ICD-10-CM | POA: Diagnosis not present

## 2015-10-03 DIAGNOSIS — K5900 Constipation, unspecified: Secondary | ICD-10-CM | POA: Diagnosis not present

## 2015-10-03 DIAGNOSIS — M1990 Unspecified osteoarthritis, unspecified site: Secondary | ICD-10-CM | POA: Diagnosis not present

## 2015-10-03 DIAGNOSIS — G5792 Unspecified mononeuropathy of left lower limb: Secondary | ICD-10-CM | POA: Diagnosis not present

## 2015-10-03 DIAGNOSIS — I10 Essential (primary) hypertension: Secondary | ICD-10-CM | POA: Diagnosis not present

## 2015-10-03 DIAGNOSIS — R1314 Dysphagia, pharyngoesophageal phase: Secondary | ICD-10-CM | POA: Diagnosis not present

## 2015-10-03 DIAGNOSIS — R262 Difficulty in walking, not elsewhere classified: Secondary | ICD-10-CM | POA: Diagnosis not present

## 2015-10-03 DIAGNOSIS — M6281 Muscle weakness (generalized): Secondary | ICD-10-CM | POA: Diagnosis not present

## 2015-10-03 MED ORDER — MAGNESIUM CITRATE PO SOLN
1.0000 | Freq: Once | ORAL | Status: AC
  Administered 2015-10-03: 1 via ORAL
  Filled 2015-10-03: qty 296

## 2015-10-03 MED ORDER — OXYCODONE-ACETAMINOPHEN 5-325 MG PO TABS: 1.0000 | ORAL_TABLET | ORAL | Status: DC | PRN

## 2015-10-03 NOTE — Progress Notes (Signed)
Discharge orders received. Pt notified and verbalized understanding. Pt dressed and belongings packed. IV removed. Report called to [facility]. [facility] transported patient to SNF.

## 2015-10-03 NOTE — Progress Notes (Signed)
CSW contacted unit nurse to see if Pt could d/c in one hour.   Pt will be available at that time.   CSW contacted [facility] and set up transport for an hour and 15 minutes.   CSW contacted facility and updated and provided number for report to nurse.   CSW following for d/c planning.     [facility]  [phone]

## 2015-10-03 NOTE — Discharge Summary (Signed)
Physician Discharge Summary  Patient ID: Amy [name] MRN: [id number] DOB/AGE: 1946-03-15 70 y.o.  Admit date: 09/28/2015 Discharge date: 10/03/2015  Admission Diagnoses: Lumbar spondylosis and stenosis L1-L2  Discharge Diagnoses: Same Active Problems:   Spinal stenosis of lumbar region   Discharged Condition: good  Hospital Course: Patient is also underwent decompressive laminectomy and fusion L1-L2 with an exploration of fusion removal of hardware L2-L4. Postop patient did very well recovered in the floor on the floor she was ambulating and voiding spontaneously tolerating regular diet stable for discharge to the nursing facility. She'll be discharged on oxycodone for pain was scheduled follow-up in one to 2 weeks.  Consults: Significant Diagnostic Studies: Treatments: L1-L2 decompression stabilization Discharge Exam: Blood pressure 131/85, pulse 95, temperature 98.7 F (37.1 C), temperature source Oral, resp. rate 17, weight 123.832 kg (273 lb), SpO2 98 %. Strength out of 5 wound clean dry and intact  Disposition: [facility]     Medication List    TAKE these medications        acetaminophen 500 MG tablet  Commonly known as:  TYLENOL  Take 1,000 mg by mouth every 8 (eight) hours as needed for pain.     azelaic acid 20 % cream  Commonly known as:  AZELEX  Apply 1 application topically 2 (two) times daily. After skin is thoroughly washed and patted dry, gently but thoroughly massage a thin film of azelaic acid cream into the affected area twice daily, in the morning and evening.     calcium citrate-vitamin D 315-200 MG-UNIT tablet  Commonly known as:  CITRACAL+D  Take 1 tablet by mouth daily.     diphenhydrAMINE 25 mg capsule  Commonly known as:  BENADRYL  Take 25 mg by mouth 2 (two) times daily as needed for allergies.     ESTRACE VAGINAL 0.1 MG/GM vaginal cream  Generic drug:  estradiol  Place 1 application vaginally 2 (two) times a week.     gabapentin 100 MG capsule  Commonly known as:  NEURONTIN  Take 300 mg by mouth at bedtime.     glycopyrrolate 2 MG tablet  Commonly known as:  ROBINUL  Take 2 mg by mouth 2 (two) times daily.     HYDROcodone-acetaminophen 5-325 MG tablet  Commonly known as:  NORCO/VICODIN  Take 1 tablet by mouth every 6 (six) hours as needed for moderate pain.     losartan-hydrochlorothiazide 100-25 MG tablet  Commonly known as:  HYZAAR  Take 1 tablet by mouth every morning.     METANX PO  Take 1 tablet by mouth 2 (two) times daily.     multivitamin with minerals Tabs tablet  Take 1 tablet by mouth daily.     oxyCODONE-acetaminophen 5-325 MG tablet  Commonly known as:  PERCOCET/ROXICET  Take 1-2 tablets by mouth every 4 (four) hours as needed for moderate pain.     pantoprazole 40 MG tablet  Commonly known as:  PROTONIX  Take 40 mg by mouth daily.     PARoxetine 30 MG tablet  Commonly known as:  PAXIL  Take 30 mg by mouth daily.     potassium chloride SA 20 MEQ tablet  Commonly known as:  K-DUR,KLOR-CON  Take 1 tablet by mouth daily.     silodosin 8 MG Caps capsule  Commonly known as:  RAPAFLO  Take 8 mg by mouth daily as needed (for bladder).     VITAMIN B COMPLEX PO  Take 1 tablet by mouth daily.  vitamin B-12 1000 MCG tablet  Commonly known as:  CYANOCOBALAMIN  Take 1,000 mcg by mouth at bedtime.     vitamin E 400 UNIT capsule  Take 400 Units by mouth daily.           Follow-up Information    Follow up with [facility] .   Specialty:  [place] information:   [address] [place] [phone]      Follow up with Elaina Hoops, MD.   Specialty:  Neurosurgery   Contact information:   [address] [place] [address] [phone]       Signed: Elaina Hoops 10/03/2015, 11:09 AM

## 2015-10-03 NOTE — Discharge Instructions (Signed)
No lifting no bending no twisting no driving a riding currently she is coming back and forth to see me. Keep incision clean dry and intact.

## 2015-10-03 NOTE — Care Management Important Message (Signed)
Important Message  Patient Details  Name: Amy [name] MRN: [id number] Date of Birth: 23-Mar-1946   Medicare Important Message Given:  Yes    Louanne Belton 10/03/2015, 1:33 PMImportant Message  Patient Details  Name: Amy [name] MRN: [id number] Date of Birth: August 08, 1946   Medicare Important Message Given:  Yes    STUTTS, TINA G 10/03/2015, 1:33 PM

## 2015-10-03 NOTE — Clinical Social Work Placement (Signed)
   CLINICAL SOCIAL WORK PLACEMENT  NOTE  Date:  10/03/2015  Patient Details  Name: Amy [name] MRN: [id number] Date of Birth: 14-Apr-1946  Clinical Social Work is seeking post-discharge placement for this patient at the [place] level of care (*CSW will initial, date and re-position this form in  chart as items are completed):  Yes   Patient/family provided with [place] Work Department's list of facilities offering this level of care within the geographic area requested by the patient (or if unable, by the patient's family).  Yes   Patient/family informed of their freedom to choose among providers that offer the needed level of care, that participate in Medicare, Medicaid or managed care program needed by the patient, have an available bed and are willing to accept the patient.  Yes   Patient/family informed of [facility]'s ownership interest in [facility] and [facility], as well as of the fact that they are under no obligation to receive care at these facilities.  PASRR submitted to EDS on 09/29/15     PASRR number received on       Existing PASRR number confirmed on 09/29/15     FL2 transmitted to all facilities in geographic area requested by pt/family on 09/29/15     FL2 transmitted to all facilities within larger geographic area on 09/29/15     Patient informed that his/her managed care company has contracts with or will negotiate with certain facilities, including the following:            Patient/family informed of bed offers received.  Patient chooses bed at  [facility])     Physician recommends and patient chooses bed at      Patient to be transferred to [facility] on 10/03/15.  Patient to be transferred to facility by [facility]     Patient family notified on 10/03/15 of transfer.  Name of family member notified:        PHYSICIAN Please sign FL2     Additional Comment:     _______________________________________________ Pete Pelt 10/03/2015, 3:34 PM

## 2015-10-03 NOTE — Progress Notes (Signed)
Patient ID: Amy [name], female   DOB: 1945/11/02, 70 y.o.   MRN: [id number] Patient doing great  Pain well-controlled neurologically stable strength 5 out of 5 wound clean dry and intact  Discharge to SNF

## 2015-10-03 NOTE — Progress Notes (Signed)
CSW spoke with Florentina Jenny from [facility] and Pt can be admitted to facility when medically ready for discharge.    [facility]  [phone]

## 2015-10-03 NOTE — Progress Notes (Signed)
Physical Therapy Treatment Patient Details Name: Amy [name] MRN: [id number] DOB: Jan 20, 1946 Today's Date: 10/03/2015    History of Present Illness s/p posterior lumbar fusion 1 level; PMH includes fibromyalsia, chronic back pain, SOB on exertion, h/o previous back surgeries and left shoulder open rotator cuff repair    PT Comments    Pt progressing towards physical therapy goals. Was able to improve ambulation distance this session with cues for improved posture and walker positioning. Continues to move very slow and guarded. Will continue to follow and progress as able per POC.   Follow Up Recommendations  SNF;Supervision/Assistance - 24 hour     Equipment Recommendations  None recommended by PT    Recommendations for Other Services       Precautions / Restrictions Precautions Precautions: Back;Fall Precaution Booklet Issued: No Precaution Comments: Patient able to state 3/3 precautions. Reviewed precautions during functional mobility.  Required Braces or Orthoses: Spinal Brace Spinal Brace: Lumbar corset;Applied in sitting position Restrictions Weight Bearing Restrictions: No    Mobility  Bed Mobility Overal bed mobility: Needs Assistance Bed Mobility: Sit to Sidelying Rolling: Supervision Sidelying to sit: Min assist       General bed mobility comments: VC's for sequencing and technique. Pt was able to roll to her side without assist, however required min assist at the shoulders to transition to full sitting position.   Transfers Overall transfer level: Needs assistance Equipment used: Rolling walker (2 wheeled) Transfers: Sit to/from Stand Sit to Stand: Min guard         General transfer comment: Close guard for safety. Pt requires increased time to stand from the commode chair (over the toilet) due to girth.  Ambulation/Gait Ambulation/Gait assistance: Min guard Ambulation Distance (Feet): 125 Feet Assistive device: Rolling walker (2  wheeled) Gait Pattern/deviations: Step-through pattern;Decreased stride length;Trunk flexed Gait velocity: Decreased Gait velocity interpretation: Below normal speed for age/gender General Gait Details: VC's for improved posture and walker positioning. Gait speed is slow and guarded with wide stance and frequent rest breaks.    Stairs            Wheelchair Mobility    Modified Rankin (Stroke Patients Only)       Balance Overall balance assessment: Needs assistance Sitting-balance support: Feet supported;No upper extremity supported Sitting balance-Leahy Scale: Fair     Standing balance support: No upper extremity supported;During functional activity Standing balance-Leahy Scale: Fair                      Cognition Arousal/Alertness: Awake/alert Behavior During Therapy: WFL for tasks assessed/performed Overall Cognitive Status: Within Functional Limits for tasks assessed                      Exercises      General Comments        Pertinent Vitals/Pain Pain Assessment: Faces Faces Pain Scale: Hurts little more Pain Location: Low back when getting to EOB.  Pain Descriptors / Indicators: Operative site guarding;Discomfort Pain Intervention(s): Limited activity within patient's tolerance;Monitored during session;Repositioned    Home Living                      Prior Function            PT Goals (current goals can now be found in the care plan section) Acute Rehab PT Goals Patient Stated Goal: SNF for rehab then home PT Goal Formulation: With patient Time For Goal Achievement: 10/13/15 Potential to  Achieve Goals: Good Progress towards PT goals: Progressing toward goals    Frequency  Min 5X/week    PT Plan Current plan remains appropriate    Co-evaluation             End of Session Equipment Utilized During Treatment: Gait belt;Back brace Activity Tolerance: Patient limited by pain;Patient limited by fatigue Patient  left: in chair;with call bell/phone within reach     Time: 0921-0956 PT Time Calculation (min) (ACUTE ONLY): 35 min  Charges:  $Gait Training: 23-37 mins                    G Codes:      Rolinda Roan 17-Oct-2015, 10:13 AM   Rolinda Roan, PT, DPT Acute Rehabilitation Services Pager: [phone]

## 2015-10-04 ENCOUNTER — Non-Acute Institutional Stay (SKILLED_NURSING_FACILITY): Payer: Medicare Other | Admitting: Internal Medicine

## 2015-10-04 ENCOUNTER — Encounter: Payer: Self-pay | Admitting: Internal Medicine

## 2015-10-04 DIAGNOSIS — F329 Major depressive disorder, single episode, unspecified: Secondary | ICD-10-CM

## 2015-10-04 DIAGNOSIS — M4726 Other spondylosis with radiculopathy, lumbar region: Secondary | ICD-10-CM | POA: Diagnosis not present

## 2015-10-04 DIAGNOSIS — R2681 Unsteadiness on feet: Secondary | ICD-10-CM | POA: Diagnosis not present

## 2015-10-04 DIAGNOSIS — M792 Neuralgia and neuritis, unspecified: Secondary | ICD-10-CM | POA: Diagnosis not present

## 2015-10-04 DIAGNOSIS — K5900 Constipation, unspecified: Secondary | ICD-10-CM | POA: Diagnosis not present

## 2015-10-04 DIAGNOSIS — M7071 Other bursitis of hip, right hip: Secondary | ICD-10-CM

## 2015-10-04 DIAGNOSIS — F32A Depression, unspecified: Secondary | ICD-10-CM

## 2015-10-04 DIAGNOSIS — I10 Essential (primary) hypertension: Secondary | ICD-10-CM

## 2015-10-04 DIAGNOSIS — K219 Gastro-esophageal reflux disease without esophagitis: Secondary | ICD-10-CM | POA: Diagnosis not present

## 2015-10-04 NOTE — Progress Notes (Signed)
Patient ID: Amy [name], female   DOB: 1945-11-26, 70 y.o.   MRN: [id number]     [facility]  PCP: Osborne Casco, MD  Code Status: No Code   Allergies  Allergen Reactions  . Latex Other (See Comments)    Sores, blisters  . Penicillins Rash    Chief Complaint  Patient presents with  . New Admit To SNF    New Admission      HPI:  70 y.o. patient is here for short term rehabilitation post hospital admission from 09/28/15-10/03/15 with lumbar spondylosis and stenosis at L1-L2. She underwent decompressive laminectomy and fusion with removal of hardware at L2-4. She is seen in her room today. Her back pain is under control with current pain regimen. She has pain in her right hip area. No other concerns. She has PMH of HTN, GERD, depression among others.  Review of Systems:  Constitutional: Negative for fever, chills, diaphoresis.  HENT: Negative for headache, congestiont, difficulty swallowing.   Eyes: Negative for double vision and discharge.  Respiratory: Negative for cough, shortness of breath and wheezing.   Cardiovascular: Negative for chest pain, palpitations, leg swelling.  Gastrointestinal: Negative for heartburn, nausea, vomiting, abdominal pain. Had bowel movement yesterday after 3 days Genitourinary: Negative for dysuria Musculoskeletal: Negative for falls Skin: Negative for rash.  Neurological: Negative for dizziness Psychiatric/Behavioral: Negative for depression   Past Medical History  Diagnosis Date  . Anxiety   . Dysphonia     intermittent "voice changes"  . Chronic back pain     lumbar steroid injection's  . Seasonal allergies   . Fibromyalgia   . Weakness     both hands and feet  . Urinary frequency   . Glaucoma   . History of MRSA infection 2011  . History of DVT (deep vein thrombosis) 1989    LEFT UPPER ARM  . Hypertension   . Short of breath on exertion   . Depression   . GERD (gastroesophageal reflux  disease)   . Pelvic pain in female   . Arthritis     ddd- RA  . Irregular heart rate     "years ago"  . Peripheral vascular disease ([place])     "poor circulation"  . Mild obstructive sleep apnea     per study 02-07-2006 - no cpap  . Asthma     "sleeping asthma"  . History of hiatal hernia   . Headache   . Neuropathy (HCC)     feet  . Low iron   . Constipation    Past Surgical History  Procedure Laterality Date  . Abdominal hysterectomy  1978  . Nose surgery  2007  . Mass excision Left 02/20/2013    Procedure: EXCISION LEFT BUTTOCK  MASS;  Surgeon: Adin Hector, MD;  Location: [facility];  Service: General;  Laterality: Left;  . Esophagogastroduodenoscopy N/A 07/15/2013    Procedure: ESOPHAGOGASTRODUODENOSCOPY (EGD);  Surgeon: Winfield Cunas., MD;  Location: [facility];  Service: Endoscopy;  Laterality: N/A;  need xray  . Abdominal adhesions removed    . Colonoscopy    . Bunionectomy Left 2008  . Shoulder open rotator cuff repair Left 05/05/2014    Procedure: OPEN ACROMIONECTOMY AND OPEN REPAIR OF ROTATOR CUFF, TISSUEMEND GRAFT WITH ANCHOR ;  Surgeon: Tobi Bastos, MD;  Location: [facility];  Service: Orthopedics;  Laterality: Left;  . Esophageal manometry N/A 11/22/2014    Procedure: ESOPHAGEAL MANOMETRY (EM);  Surgeon: Nancy Fetter  Brooke Bonito., MD;  Location: [facility];  Service: Endoscopy;  Laterality: N/A;  . Lumbar fusion  2014    L4 -- L5  . Removal hardware l4-l5/  bilateral laminectomy l2 - l5 and fusion  06-12-2011  . Laparoscopic cholecystectomy  01-11-2006  . Cysto with hydrodistension N/A 07/12/2015    Procedure: CYSTOSCOPY/HYDRODISTENSION;  Surgeon: Bjorn Loser, MD;  Location: [facility];  Service: Urology;  Laterality: N/A;  . Eye surgery Bilateral     cataract surgery with lens implants  . Tonsillectomy     Social History:   reports that she quit smoking about 46 years ago. She has never used smokeless tobacco. She reports that she drinks  alcohol. She reports that she does not use illicit drugs.  Family History  Problem Relation Age of Onset  . Hypertension Mother   . Cancer Mother     cervix  . Kidney disease Mother   . Hypertension Sister   . Cancer Sister     polyps  . Kidney disease Brother   . Cancer Daughter     leukemia  . Hypertension Brother     Medications:   Medication List       This list is accurate as of: 10/04/15 12:23 PM.  Always use your most recent med list.               acetaminophen 500 MG tablet  Commonly known as:  TYLENOL  Take 1,000 mg by mouth every 8 (eight) hours as needed for pain.     azelaic acid 20 % cream  Commonly known as:  AZELEX  Apply 1 application topically 2 (two) times daily. After skin is thoroughly washed and patted dry, gently but thoroughly massage a thin film of azelaic acid cream into the affected area twice daily, in the morning and evening.     calcium citrate-vitamin D 315-200 MG-UNIT tablet  Commonly known as:  CITRACAL+D  Take 1 tablet by mouth daily.     diphenhydrAMINE 25 mg capsule  Commonly known as:  BENADRYL  Take 25 mg by mouth 2 (two) times daily as needed for allergies.     ESTRACE VAGINAL 0.1 MG/GM vaginal cream  Generic drug:  estradiol  Place 1 application vaginally 2 (two) times a week.     gabapentin 300 MG capsule  Commonly known as:  NEURONTIN  Take 300 mg by mouth 2 (two) times daily as needed.     glycopyrrolate 2 MG tablet  Commonly known as:  ROBINUL  Take 1 mg by mouth 2 (two) times daily.     losartan-hydrochlorothiazide 100-25 MG tablet  Commonly known as:  HYZAAR  Take 1 tablet by mouth every morning.     METANX PO  Take 1 tablet by mouth 2 (two) times daily.     multivitamin with minerals Tabs tablet  Take 1 tablet by mouth daily.     oxyCODONE-acetaminophen 5-325 MG tablet  Commonly known as:  PERCOCET/ROXICET  Take 1-2 tablets by mouth every 4 (four) hours as needed for moderate pain.     pantoprazole 40  MG tablet  Commonly known as:  PROTONIX  Take 40 mg by mouth daily.     PARoxetine 30 MG tablet  Commonly known as:  PAXIL  Take 30 mg by mouth daily.     potassium chloride 10 MEQ tablet  Commonly known as:  K-DUR,KLOR-CON  Take 20 mEq by mouth daily.     silodosin 8 MG Caps capsule  Commonly known as:  RAPAFLO  Take 8 mg by mouth daily as needed (for bladder).     VITAMIN B COMPLEX PO  Take 1 tablet by mouth daily.     vitamin B-12 1000 MCG tablet  Commonly known as:  CYANOCOBALAMIN  Take 1,000 mcg by mouth at bedtime.     vitamin E 400 UNIT capsule  Take 400 Units by mouth daily.         Physical Exam: Filed Vitals:   10/04/15 1138  BP: 133/75  Pulse: 92  Temp: 98.7 F (37.1 C)  TempSrc: Oral  Resp: 22  Height: 5\' 5"  (1.651 m)  Weight: 273 lb (123.832 kg)  SpO2: 96%    General- elderly female, obese, in no acute distress Head- normocephalic, atraumatic Nose- no maxillary or frontal sinus tenderness, no nasal discharge Throat- moist mucus membrane  Eyes- no pallor, no icterus, no discharge, normal conjunctiva, normal sclera Neck- no cervical lymphadenopathy Cardiovascular- normal s1,s2, no murmurs, palpable dorsalis pedis and radial pulses, trace leg edema Respiratory- bilateral clear to auscultation, no wheeze, no rhonchi, no crackles, no use of accessory muscles Abdomen- bowel sounds present, soft, non tender Musculoskeletal- able to move all 4 extremities, unsteady gait  Neurological- no focal deficit, alert and oriented to person, place and time Skin- warm and dry, lumbar incision with honeycomb dressing Psychiatry- normal mood and affect    Labs reviewed: Basic Metabolic Panel:  Recent Labs  07/12/15 0806 09/21/15 0911  NA 140 139  K 3.0* 4.2  CL  --  103  CO2  --  29  GLUCOSE 116* 113*  BUN  --  12  CREATININE  --  1.04*  CALCIUM  --  9.4   CBC:  Recent Labs  07/12/15 0806 09/21/15 0911  WBC  --  2.9*  HGB 14.6 14.8  HCT 43.0  44.4  MCV  --  91.0  PLT  --  276    Assessment/Plan  Unsteady gait Post lumbar decompression and laminectomy with fusion. Will have her work with physical therapy and occupational therapy team to help with gait training and muscle strengthening exercises.fall precautions. Skin care. Encourage to be out of bed.   Lumbar spondylosis S/p lumbar decompressive laminectomy and fusion. Has f/u with neurosurgery. Continue oxycodone-apap 5-325 mg 1-2 tab q4h prn pain. Back precautions and to wear her back brace. Will have patient work with PT/OT as tolerated to regain strength and restore function.  Fall precautions are in place. Continue calcium and vitamin d.   Neuropathic pain Continue gabapentin 300 mg qhs for now  HTN Stable bp, monitor, continue losartan-hctz 100-25 mg daily  gerd Stable, continue protonix 40 mg daily  Constipation Add senna s 1 tab qhs and daily prn miralax for now nad monitor, encouraged hydration  Depression Stable mood, continue paxil home regimen  Right hip bursitis Start naprosyn 500 mg bid x 1 week and reassess   Goals of care: short term rehabilitation   Labs/tests ordered: cbc, cmp 10/05/15  Family/ staff Communication: reviewed care plan with patient and nursing supervisor    Blanchie Serve, MD  [facility] [phone] (Monday-Friday 8 am - 5 pm) [phone] (afterhours)

## 2015-10-05 LAB — HEPATIC FUNCTION PANEL
ALT: 23 U/L (ref 7–35)
AST: 23 U/L (ref 13–35)
Alkaline Phosphatase: 106 U/L (ref 25–125)
Bilirubin, Total: 0.2 mg/dL

## 2015-10-05 LAB — BASIC METABOLIC PANEL
BUN: 12 mg/dL (ref 4–21)
Creatinine: 1 mg/dL (ref 0.5–1.1)
Glucose: 108 mg/dL
Potassium: 3.9 mmol/L (ref 3.4–5.3)
Sodium: 141 mmol/L (ref 137–147)

## 2015-10-05 LAB — CBC AND DIFFERENTIAL
HCT: 32 % — AB (ref 36–46)
Hemoglobin: 10.1 g/dL — AB (ref 12.0–16.0)
Neutrophils Absolute: 43 /uL
Platelets: 415 10*3/uL — AB (ref 150–399)

## 2015-10-10 ENCOUNTER — Non-Acute Institutional Stay (SKILLED_NURSING_FACILITY): Payer: Medicare Other | Admitting: Nurse Practitioner

## 2015-10-10 DIAGNOSIS — F411 Generalized anxiety disorder: Secondary | ICD-10-CM

## 2015-10-10 DIAGNOSIS — I10 Essential (primary) hypertension: Secondary | ICD-10-CM | POA: Diagnosis not present

## 2015-10-10 DIAGNOSIS — M4726 Other spondylosis with radiculopathy, lumbar region: Secondary | ICD-10-CM | POA: Diagnosis not present

## 2015-10-10 LAB — CBC AND DIFFERENTIAL: WBC: 5.3 10^3/mL

## 2015-10-10 NOTE — Progress Notes (Signed)
Patient ID: Amy [name], female   DOB: March 30, 1946, 70 y.o.   MRN: [id number]  Location:  [place]  Provider:  Osborne Casco, MD  Code Status:  FULL Goals of care: Advanced Directive information Advanced Directives 10/01/2015  Does patient have an advance directive? Yes  Type of Paramedic of [place];Living will  Does patient want to make changes to advanced directive? No - Patient declined  Copy of advanced directive(s) in chart? No - copy requested  Would patient like information on creating an advanced directive? -  Pre-existing out of facility DNR order (yellow form or pink MOST form) -     Chief Complaint  Patient presents with  . Acute Visit    elevated blood pressure    HPI:  Pt is a 70 y.o. female seen today for due to elevated blood pressure. Pt with a hx of htn, lumbar spondylosis and stenosis at L1-L2. She underwent decompressive laminectomy and fusion with removal of hardware at L2-4. Nursing notes blood pressure elevated last night and this morning. Pt reports no increase in pain (reports chronic pain). Pt reports she just found out her cousin died last night and is having some anxiety and sadness due to this. Does not feel like this is overwhelming. Her cousin had been sick but was still unexpected. Pt denies shortness of breath, chest pains, blurred vision, dizziness or feeling light headed. Reports not feeling well today and having a headache. No N/V. Clonidine 0.1 mg given and blood pressure improved from 186/84 to 155/94. Pt reports some sleepiness related to clonidine.    Review of Systems  Constitutional: Negative for fever, chills and malaise/fatigue.  HENT: Negative for congestion, nosebleeds and tinnitus.   Respiratory: Negative for cough, sputum production and shortness of breath.   Cardiovascular: Negative for chest pain, palpitations and leg swelling.  Gastrointestinal: Negative for heartburn, abdominal pain,  diarrhea and constipation.  Genitourinary: Negative for dysuria, urgency and frequency.  Musculoskeletal: Positive for myalgias. Negative for back pain, joint pain and falls.  Skin: Negative.   Neurological: Negative for dizziness, weakness and headaches.       Headache  Psychiatric/Behavioral: Negative for depression and memory loss. The patient is nervous/anxious. The patient does not have insomnia.     Past Medical History  Diagnosis Date  . Anxiety   . Dysphonia     intermittent "voice changes"  . Chronic back pain     lumbar steroid injection's  . Seasonal allergies   . Fibromyalgia   . Weakness     both hands and feet  . Urinary frequency   . Glaucoma   . History of MRSA infection 2011  . History of DVT (deep vein thrombosis) 1989    LEFT UPPER ARM  . Hypertension   . Short of breath on exertion   . Depression   . GERD (gastroesophageal reflux disease)   . Pelvic pain in female   . Arthritis     ddd- RA  . Irregular heart rate     "years ago"  . Peripheral vascular disease ([place])     "poor circulation"  . Mild obstructive sleep apnea     per study 02-07-2006 - no cpap  . Asthma     "sleeping asthma"  . History of hiatal hernia   . Headache   . Neuropathy (HCC)     feet  . Low iron   . Constipation    Past Surgical History  Procedure Laterality Date  .  Abdominal hysterectomy  1978  . Nose surgery  2007  . Mass excision Left 02/20/2013    Procedure: EXCISION LEFT BUTTOCK  MASS;  Surgeon: Adin Hector, MD;  Location: [facility];  Service: General;  Laterality: Left;  . Esophagogastroduodenoscopy N/A 07/15/2013    Procedure: ESOPHAGOGASTRODUODENOSCOPY (EGD);  Surgeon: Winfield Cunas., MD;  Location: [facility];  Service: Endoscopy;  Laterality: N/A;  need xray  . Abdominal adhesions removed    . Colonoscopy    . Bunionectomy Left 2008  . Shoulder open rotator cuff repair Left 05/05/2014    Procedure: OPEN ACROMIONECTOMY AND OPEN REPAIR OF ROTATOR CUFF,  TISSUEMEND GRAFT WITH ANCHOR ;  Surgeon: Tobi Bastos, MD;  Location: [facility];  Service: Orthopedics;  Laterality: Left;  . Esophageal manometry N/A 11/22/2014    Procedure: ESOPHAGEAL MANOMETRY (EM);  Surgeon: Winfield Cunas., MD;  Location: [facility];  Service: Endoscopy;  Laterality: N/A;  . Lumbar fusion  2014    L4 -- L5  . Removal hardware l4-l5/  bilateral laminectomy l2 - l5 and fusion  06-12-2011  . Laparoscopic cholecystectomy  01-11-2006  . Cysto with hydrodistension N/A 07/12/2015    Procedure: CYSTOSCOPY/HYDRODISTENSION;  Surgeon: Bjorn Loser, MD;  Location: [facility];  Service: Urology;  Laterality: N/A;  . Eye surgery Bilateral     cataract surgery with lens implants  . Tonsillectomy      Allergies  Allergen Reactions  . Latex Other (See Comments)    Sores, blisters  . Penicillins Rash      Medication List       This list is accurate as of: 10/10/15  2:48 PM.  Always use your most recent med list.               acetaminophen 500 MG tablet  Commonly known as:  TYLENOL  Take 1,000 mg by mouth every 8 (eight) hours as needed for pain.     azelaic acid 20 % cream  Commonly known as:  AZELEX  Apply 1 application topically 2 (two) times daily. After skin is thoroughly washed and patted dry, gently but thoroughly massage a thin film of azelaic acid cream into the affected area twice daily, in the morning and evening.     calcium citrate-vitamin D 315-200 MG-UNIT tablet  Commonly known as:  CITRACAL+D  Take 1 tablet by mouth daily.     diphenhydrAMINE 25 mg capsule  Commonly known as:  BENADRYL  Take 25 mg by mouth 2 (two) times daily as needed for allergies.     ESTRACE VAGINAL 0.1 MG/GM vaginal cream  Generic drug:  estradiol  Place 1 application vaginally 2 (two) times a week.     gabapentin 300 MG capsule  Commonly known as:  NEURONTIN  Take 300 mg by mouth 2 (two) times daily as needed.     glycopyrrolate 2 MG tablet    Commonly known as:  ROBINUL  Take 1 mg by mouth 2 (two) times daily.     losartan-hydrochlorothiazide 100-25 MG tablet  Commonly known as:  HYZAAR  Take 1 tablet by mouth every morning.     METANX PO  Take 1 tablet by mouth 2 (two) times daily.     multivitamin with minerals Tabs tablet  Take 1 tablet by mouth daily.     oxyCODONE-acetaminophen 5-325 MG tablet  Commonly known as:  PERCOCET/ROXICET  Take 1-2 tablets by mouth every 4 (four) hours as needed for moderate pain.  pantoprazole 40 MG tablet  Commonly known as:  PROTONIX  Take 40 mg by mouth daily.     PARoxetine 30 MG tablet  Commonly known as:  PAXIL  Take 30 mg by mouth daily.     potassium chloride 10 MEQ tablet  Commonly known as:  K-DUR,KLOR-CON  Take 20 mEq by mouth daily.     silodosin 8 MG Caps capsule  Commonly known as:  RAPAFLO  Take 8 mg by mouth daily as needed (for bladder).     VITAMIN B COMPLEX PO  Take 1 tablet by mouth daily.     vitamin B-12 1000 MCG tablet  Commonly known as:  CYANOCOBALAMIN  Take 1,000 mcg by mouth at bedtime.     vitamin E 400 UNIT capsule  Take 400 Units by mouth daily.        Immunization History  Administered Date(s) Administered  . PPD Test 10/03/2015   Pertinent  Health Maintenance Due  Topic Date Due  . COLONOSCOPY  01/23/1996  . DEXA SCAN  01/23/2011  . PNA vac Low Risk Adult (1 of 2 - PCV13) 01/23/2011  . INFLUENZA VACCINE  04/25/2015  . MAMMOGRAM  10/19/2016   No flowsheet data found.  Filed Vitals:   10/10/15 1442  BP: 186/84  Pulse: 90  Temp: 98.4 F (36.9 C)  Resp: 20   There is no weight on file to calculate BMI. Physical Exam  Constitutional: She is oriented to person, place, and time. She appears well-developed and well-nourished.  Eyes: EOM are normal. Pupils are equal, round, and reactive to light.  Neck: Neck supple. No JVD present.  Cardiovascular: Normal rate, regular rhythm and normal heart sounds.   Pulmonary/Chest:  Effort normal and breath sounds normal.  Abdominal: Soft.  Musculoskeletal: She exhibits no edema.  Neurological: She is alert and oriented to person, place, and time.  Psychiatric: She has a normal mood and affect. Her behavior is normal.    Labs reviewed:  Recent Labs  07/12/15 0806 09/21/15 0911  NA 140 139  K 3.0* 4.2  CL  --  103  CO2  --  29  GLUCOSE 116* 113*  BUN  --  12  CREATININE  --  1.04*  CALCIUM  --  9.4   No results for input(s): AST, ALT, ALKPHOS, BILITOT, PROT, ALBUMIN in the last 8760 hours.  Recent Labs  07/12/15 0806 09/21/15 0911  WBC  --  2.9*  HGB 14.6 14.8  HCT 43.0 44.4  MCV  --  91.0  PLT  --  276   No results found for: TSH No results found for: HGBA1C No results found for: CHOL, HDL, LDLCALC, LDLDIRECT, TRIG, CHOLHDL  Significant Diagnostic Results in last 30 days:  Dg Lumbar Spine 2-3 Views  09/28/2015  CLINICAL DATA:  L1-2 PLIF EXAM: DG C-ARM 61-120 MIN; LUMBAR SPINE - 2-3 VIEW COMPARISON:  09/04/2013 FLUOROSCOPY TIME:  Radiation Exposure Index (as provided by the fluoroscopic device): Not available If the device does not provide the exposure index: Fluoroscopy Time:  28 seconds Number of Acquired Images:  2 FINDINGS: Prior fusion is again noted at L3-4. Pedicle screws have been removed at L4 an new pedicle screws placed at L1 with interbody fusion at L1-2. IMPRESSION: L1-2 PLIF Electronically Signed   By: Inez Catalina M.D.   On: 09/28/2015 13:14   Dg C-arm 1-60 Min  09/28/2015  CLINICAL DATA:  L1-2 PLIF EXAM: DG C-ARM 61-120 MIN; LUMBAR SPINE - 2-3 VIEW COMPARISON:  09/04/2013 FLUOROSCOPY TIME:  Radiation Exposure Index (as provided by the fluoroscopic device): Not available If the device does not provide the exposure index: Fluoroscopy Time:  28 seconds Number of Acquired Images:  2 FINDINGS: Prior fusion is again noted at L3-4. Pedicle screws have been removed at L4 an new pedicle screws placed at L1 with interbody fusion at L1-2. IMPRESSION:  L1-2 PLIF Electronically Signed   By: Inez Catalina M.D.   On: 09/28/2015 13:14    Assessment/Plan 1. Essential hypertension Not controlled, sbp as high as 202 today, improved after clonidine. Will cont clonidine 0.1 mg q 8 hours PRN sbp > 180. Will cont losartan/hctz and add norvasc 5 mg daily PO. Staff to cont VS q shift.   2. Osteoarthritis of spine with radiculopathy, lumbar region S/p lumbar decompressive laminectomy and fusion. Pain is controlled on current regimen. Continue oxycodone-apap 5-325 mg 1-2 tab q4h prn pain.   3. Anxiety state Due to recent events in pts life with cousin passing, pt feels like she is coping effectively. Will monitor

## 2015-10-11 ENCOUNTER — Non-Acute Institutional Stay (SKILLED_NURSING_FACILITY): Payer: Medicare Other | Admitting: Internal Medicine

## 2015-10-11 DIAGNOSIS — I10 Essential (primary) hypertension: Secondary | ICD-10-CM | POA: Diagnosis not present

## 2015-10-11 DIAGNOSIS — D62 Acute posthemorrhagic anemia: Secondary | ICD-10-CM | POA: Diagnosis not present

## 2015-10-11 DIAGNOSIS — F411 Generalized anxiety disorder: Secondary | ICD-10-CM | POA: Diagnosis not present

## 2015-10-11 NOTE — Progress Notes (Signed)
Patient ID: Amy [name], female   DOB: 1946-09-08, 70 y.o.   MRN: [id number]      [facility]  PCP: Osborne Casco, MD   Allergies  Allergen Reactions  . Latex Other (See Comments)    Sores, blisters  . Penicillins Rash    Chief Complaint  Patient presents with  . Acute Visit    elevated blood pressure, headache, drop in Hb     HPI:  70 y.o. patient is seen for acute visit for elevated BP reading. Her BP has been 202/98, 172/96. She complaints of frontal headache today. She is currently on hyzaar 100-25 mg daily and was started on norvasc 5 mg daily yesterday. She is here for short term rehabilitation post decompressive laminectomy and fusion with removal of hardware at L2-4. Her pain is under control with current pain regimen. Denies anxiety and has hx of anxiety. On lab review has drop in Hb.   Review of Systems:  Constitutional: Negative for fever, chills, diaphoresis.  HENT: Negative for congestion  Eyes: Negative for double vision and discharge.  Respiratory: Negative for cough, shortness of breath and wheezing.   Cardiovascular: Negative for chest pain, palpitations, leg swelling.  Gastrointestinal: Negative for heartburn, nausea, vomiting, abdominal pain. Had bowel movement 2 days back. No flank pain Musculoskeletal: Negative for falls Skin: Negative for rash.  Neurological: Negative for dizziness   Past Medical History  Diagnosis Date  . Anxiety   . Dysphonia     intermittent "voice changes"  . Chronic back pain     lumbar steroid injection's  . Seasonal allergies   . Fibromyalgia   . Weakness     both hands and feet  . Urinary frequency   . Glaucoma   . History of MRSA infection 2011  . History of DVT (deep vein thrombosis) 1989    LEFT UPPER ARM  . Hypertension   . Short of breath on exertion   . Depression   . GERD (gastroesophageal reflux disease)   . Pelvic pain in female   . Arthritis     ddd- RA  .  Irregular heart rate     "years ago"  . Peripheral vascular disease ([place])     "poor circulation"  . Mild obstructive sleep apnea     per study 02-07-2006 - no cpap  . Asthma     "sleeping asthma"  . History of hiatal hernia   . Headache   . Neuropathy (HCC)     feet  . Low iron   . Constipation      Medications:   Medication List       This list is accurate as of: 10/11/15 12:39 PM.  Always use your most recent med list.               acetaminophen 500 MG tablet  Commonly known as:  TYLENOL  Take 1,000 mg by mouth every 8 (eight) hours as needed for pain.     azelaic acid 20 % cream  Commonly known as:  AZELEX  Apply 1 application topically 2 (two) times daily. After skin is thoroughly washed and patted dry, gently but thoroughly massage a thin film of azelaic acid cream into the affected area twice daily, in the morning and evening.     calcium citrate-vitamin D 315-200 MG-UNIT tablet  Commonly known as:  CITRACAL+D  Take 1 tablet by mouth daily.     diphenhydrAMINE 25 mg capsule  Commonly known as:  BENADRYL  Take 25 mg by mouth 2 (two) times daily as needed for allergies.     ESTRACE VAGINAL 0.1 MG/GM vaginal cream  Generic drug:  estradiol  Place 1 application vaginally 2 (two) times a week.     gabapentin 300 MG capsule  Commonly known as:  NEURONTIN  Take 300 mg by mouth 2 (two) times daily as needed.     glycopyrrolate 2 MG tablet  Commonly known as:  ROBINUL  Take 1 mg by mouth 2 (two) times daily.     losartan-hydrochlorothiazide 100-25 MG tablet  Commonly known as:  HYZAAR  Take 1 tablet by mouth every morning.     METANX PO  Take 1 tablet by mouth 2 (two) times daily.     multivitamin with minerals Tabs tablet  Take 1 tablet by mouth daily.     oxyCODONE-acetaminophen 5-325 MG tablet  Commonly known as:  PERCOCET/ROXICET  Take 1-2 tablets by mouth every 4 (four) hours as needed for moderate pain.     pantoprazole 40 MG tablet  Commonly  known as:  PROTONIX  Take 40 mg by mouth daily.     PARoxetine 30 MG tablet  Commonly known as:  PAXIL  Take 30 mg by mouth daily.     potassium chloride 10 MEQ tablet  Commonly known as:  K-DUR,KLOR-CON  Take 20 mEq by mouth daily.     silodosin 8 MG Caps capsule  Commonly known as:  RAPAFLO  Take 8 mg by mouth daily as needed (for bladder).     VITAMIN B COMPLEX PO  Take 1 tablet by mouth daily.     vitamin B-12 1000 MCG tablet  Commonly known as:  CYANOCOBALAMIN  Take 1,000 mcg by mouth at bedtime.     vitamin E 400 UNIT capsule  Take 400 Units by mouth daily.         Physical Exam: Filed Vitals:   10/11/15 1226  BP: 172/96  Pulse: 83  Temp: 98.7 F (37.1 C)  Resp: 18  SpO2: 96%   BP Readings from Last 3 Encounters:  10/11/15 172/96  10/10/15 186/84  10/04/15 133/75   General- elderly female, obese, in no acute distress Head- normocephalic, atraumatic Nose- no maxillary or frontal sinus tenderness, no nasal discharge Throat- moist mucus membrane  Eyes- no pallor, no icterus, no discharge, normal conjunctiva, normal sclera Neck- no cervical lymphadenopathy Cardiovascular- normal s1,s2, no murmurs, palpable dorsalis pedis and radial pulses, trace leg edema Respiratory- bilateral clear to auscultation, no wheeze, no rhonchi, no crackles, no use of accessory muscles Abdomen- bowel sounds present, soft, non tender Musculoskeletal- able to move all 4 extremities, unsteady gait  Neurological- no focal deficit, alert and oriented to person, place and time Skin- warm and dry, lumbar incision with honeycomb dressing Psychiatry- normal mood and affect    Labs reviewed: Basic Metabolic Panel:  Recent Labs  07/12/15 0806 09/21/15 0911  NA 140 139  K 3.0* 4.2  CL  --  103  CO2  --  29  GLUCOSE 116* 113*  BUN  --  12  CREATININE  --  1.04*  CALCIUM  --  9.4   CBC:  Recent Labs  07/12/15 0806 09/21/15 0911  WBC  --  2.9*  HGB 14.6 14.8  HCT 43.0  44.4  MCV  --  91.0  PLT  --  276   10/05/15 wbc 5.4, hb 9.8, hct 30.8, plt 330, na 141, k 3.9, bun 12, cr 0.97,  lft wnl  Assessment/Plan  Uncontrolled HTN Reviewed renal function from 10/05/15 with cr 0.97. Has mild headache. No symptoms otherwise of emergency. Pain under control. Denies anxiety. Continue hyzaar 100-25 mg daily. Discontinue norvasc. start hydralazine 25 mg bid. Will also have her on clonidine 0.1 mg bid prn for SBP > 190 only. Check bp q shift for now. Check bmp 10/17/15. Continue kcl  Anxiety state Continue paxil for now  Blood loss anemia Post op, hb 9.8 from 14.8 on admission. Denies any bleed or melena. Normal mcv. Recheck hb in 1 week.   Labs/tests ordered: cbc, cmp 10/17/15  Family/ staff Communication: reviewed care plan with patient and nursing supervisor    Blanchie Serve, MD  [facility] [phone] (Monday-Friday 8 am - 5 pm) [phone] (afterhours)

## 2015-10-13 ENCOUNTER — Non-Acute Institutional Stay (SKILLED_NURSING_FACILITY): Payer: Medicare Other | Admitting: Internal Medicine

## 2015-10-13 DIAGNOSIS — G5793 Unspecified mononeuropathy of bilateral lower limbs: Secondary | ICD-10-CM | POA: Insufficient documentation

## 2015-10-13 DIAGNOSIS — K219 Gastro-esophageal reflux disease without esophagitis: Secondary | ICD-10-CM

## 2015-10-13 DIAGNOSIS — K5901 Slow transit constipation: Secondary | ICD-10-CM | POA: Diagnosis not present

## 2015-10-13 DIAGNOSIS — I10 Essential (primary) hypertension: Secondary | ICD-10-CM | POA: Diagnosis not present

## 2015-10-13 DIAGNOSIS — F32A Depression, unspecified: Secondary | ICD-10-CM

## 2015-10-13 DIAGNOSIS — G5792 Unspecified mononeuropathy of left lower limb: Secondary | ICD-10-CM

## 2015-10-13 DIAGNOSIS — F329 Major depressive disorder, single episode, unspecified: Secondary | ICD-10-CM

## 2015-10-13 DIAGNOSIS — K5900 Constipation, unspecified: Secondary | ICD-10-CM | POA: Insufficient documentation

## 2015-10-13 DIAGNOSIS — G5791 Unspecified mononeuropathy of right lower limb: Secondary | ICD-10-CM

## 2015-10-13 DIAGNOSIS — M1990 Unspecified osteoarthritis, unspecified site: Secondary | ICD-10-CM | POA: Diagnosis not present

## 2015-10-13 NOTE — Progress Notes (Signed)
Patient ID: ZOELYNN PICKNEY, female   DOB: Jun 14, 1946, 70 y.o.   MRN: [id number]    Location:  [facility]  Provider: Blanchie Serve   PCP: Osborne Casco, MD  Code Status: full code  Goals of care:  Advanced Directive information Advanced Directives 10/01/2015  Does patient have an advance directive? Yes  Type of Paramedic of [place];Living will  Does patient want to make changes to advanced directive? No - Patient declined  Copy of advanced directive(s) in chart? No - copy requested  Would patient like information on creating an advanced directive? -  Pre-existing out of facility DNR order (yellow form or pink MOST form) -     Allergies  Allergen Reactions  . Latex Other (See Comments)    Sores, blisters  . Penicillins Rash    Chief Complaint  Patient presents with  . Discharge Note    HPI:  70 y.o. female seen at [facility]. She had a hosp admission 09/28/2015 to 10/03/2015 for Lumbar spondylosis and stenosis at L1-L2. She underwent decompressive laminectomy and fusion with removal of hardware at L2-4. She has a past medical history of HTN, Depression, GERD, Osteoarthritis among others. She is seen today in her room sitting on her chair. She states her back and right hip pain is under control with current medication. She states had small bowel movement 10/12/2015. She voices no concerns. Her BP has been elevated. hydralazine recently started 25 mg  Twice daily. She reports being stressed about  her cousin who recently passed away and has a grandmother who has dementia that needs assistance. She has worked with physical therapy.   Review of Systems  Constitutional: Negative.   HENT: Negative.   Eyes: Negative.   Respiratory: Negative.   Cardiovascular: Negative.   Gastrointestinal: Negative.   Genitourinary: Negative.   Musculoskeletal: Negative for falls.       Per HPI ambulates  with walker.   Skin: Negative.   Neurological:       Neuropathic pain to right leg.   Endo/Heme/Allergies: Negative.   Psychiatric/Behavioral: Negative.     Past Medical History  Diagnosis Date  . Anxiety   . Dysphonia     intermittent "voice changes"  . Chronic back pain     lumbar steroid injection's  . Seasonal allergies   . Fibromyalgia   . Weakness     both hands and feet  . Urinary frequency   . Glaucoma   . History of MRSA infection 2011  . History of DVT (deep vein thrombosis) 1989    LEFT UPPER ARM  . Hypertension   . Short of breath on exertion   . Depression   . GERD (gastroesophageal reflux disease)   . Pelvic pain in female   . Arthritis     ddd- RA  . Irregular heart rate     "years ago"  . Peripheral vascular disease ([place])     "poor circulation"  . Mild obstructive sleep apnea     per study 02-07-2006 - no cpap  . Asthma     "sleeping asthma"  . History of hiatal hernia   . Headache   . Neuropathy (HCC)     feet  . Low iron   . Constipation     Past Surgical History  Procedure Laterality Date  . Abdominal hysterectomy  1978  . Nose surgery  2007  . Mass excision Left 02/20/2013    Procedure: EXCISION  LEFT BUTTOCK  MASS;  Surgeon: Adin Hector, MD;  Location: [facility];  Service: General;  Laterality: Left;  . Esophagogastroduodenoscopy N/A 07/15/2013    Procedure: ESOPHAGOGASTRODUODENOSCOPY (EGD);  Surgeon: Winfield Cunas., MD;  Location: [facility];  Service: Endoscopy;  Laterality: N/A;  need xray  . Abdominal adhesions removed    . Colonoscopy    . Bunionectomy Left 2008  . Shoulder open rotator cuff repair Left 05/05/2014    Procedure: OPEN ACROMIONECTOMY AND OPEN REPAIR OF ROTATOR CUFF, TISSUEMEND GRAFT WITH ANCHOR ;  Surgeon: Tobi Bastos, MD;  Location: [facility];  Service: Orthopedics;  Laterality: Left;  . Esophageal manometry N/A 11/22/2014    Procedure: ESOPHAGEAL MANOMETRY (EM);  Surgeon: Winfield Cunas., MD;  Location:  [facility];  Service: Endoscopy;  Laterality: N/A;  . Lumbar fusion  2014    L4 -- L5  . Removal hardware l4-l5/  bilateral laminectomy l2 - l5 and fusion  06-12-2011  . Laparoscopic cholecystectomy  01-11-2006  . Cysto with hydrodistension N/A 07/12/2015    Procedure: CYSTOSCOPY/HYDRODISTENSION;  Surgeon: Bjorn Loser, MD;  Location: [facility];  Service: Urology;  Laterality: N/A;  . Eye surgery Bilateral     cataract surgery with lens implants  . Tonsillectomy        reports that she quit smoking about 46 years ago. She has never used smokeless tobacco. She reports that she drinks alcohol. She reports that she does not use illicit drugs.  Allergies  Allergen Reactions  . Latex Other (See Comments)    Sores, blisters  . Penicillins Rash    Pertinent  Health Maintenance Due  Topic Date Due  . COLONOSCOPY  01/23/1996  . DEXA SCAN  01/23/2011  . PNA vac Low Risk Adult (1 of 2 - PCV13) 01/23/2011  . INFLUENZA VACCINE  04/25/2015  . MAMMOGRAM  10/19/2016    Medications:   Medication List       This list is accurate as of: 10/13/15  5:35 PM.  Always use your most recent med list.               acetaminophen 500 MG tablet  Commonly known as:  TYLENOL  Take 1,000 mg by mouth every 8 (eight) hours as needed for pain.     azelaic acid 20 % cream  Commonly known as:  AZELEX  Apply 1 application topically 2 (two) times daily. After skin is thoroughly washed and patted dry, gently but thoroughly massage a thin film of azelaic acid cream into the affected area twice daily, in the morning and evening.     calcium citrate-vitamin D 315-200 MG-UNIT tablet  Commonly known as:  CITRACAL+D  Take 1 tablet by mouth daily.     cloNIDine 0.1 MG tablet  Commonly known as:  CATAPRES  Take 0.1 mg by mouth every 12 (twelve) hours as needed. For Systolic > 99991111     diphenhydrAMINE 25 mg capsule  Commonly known as:  BENADRYL  Take 25 mg by mouth 2 (two) times daily  as needed for allergies.     ESTRACE VAGINAL 0.1 MG/GM vaginal cream  Generic drug:  estradiol  Place 1 application vaginally 2 (two) times a week.     gabapentin 300 MG capsule  Commonly known as:  NEURONTIN  Take 300 mg by mouth 2 (two) times daily as needed.     glycopyrrolate 2 MG tablet  Commonly known as:  ROBINUL  Take 1 mg by  mouth 2 (two) times daily.     hydrALAZINE 25 MG tablet  Commonly known as:  APRESOLINE  Take 25 mg by mouth 3 (three) times daily.     losartan-hydrochlorothiazide 100-25 MG tablet  Commonly known as:  HYZAAR  Take 1 tablet by mouth every morning.     METANX PO  Take 1 tablet by mouth 2 (two) times daily.     multivitamin with minerals Tabs tablet  Take 1 tablet by mouth daily.     oxyCODONE-acetaminophen 5-325 MG tablet  Commonly known as:  PERCOCET/ROXICET  Take 1-2 tablets by mouth every 4 (four) hours as needed for moderate pain.     pantoprazole 40 MG tablet  Commonly known as:  PROTONIX  Take 40 mg by mouth daily.     PARoxetine 30 MG tablet  Commonly known as:  PAXIL  Take 30 mg by mouth daily.     potassium chloride 10 MEQ tablet  Commonly known as:  K-DUR,KLOR-CON  Take 20 mEq by mouth daily.     silodosin 8 MG Caps capsule  Commonly known as:  RAPAFLO  Take 8 mg by mouth daily as needed (for bladder).     VITAMIN B COMPLEX PO  Take 1 tablet by mouth daily.     vitamin B-12 1000 MCG tablet  Commonly known as:  CYANOCOBALAMIN  Take 1,000 mcg by mouth at bedtime.     vitamin E 400 UNIT capsule  Take 400 Units by mouth daily.         Filed Vitals:   10/13/15 1510  BP: 160/86  Pulse: 85  Temp: 98.4 F (36.9 C)  Resp: 22  Weight: 275 lb 6.4 oz (124.921 kg)   Body mass index is 45.83 kg/(m^2). Physical Exam  Constitutional: She is oriented to person, place, and time. She appears well-nourished.  Obese in no acute distress.  HENT:  Head: Normocephalic.  Eyes: EOM are normal. Pupils are equal, round, and  reactive to light.  Neck: Normal range of motion.  Cardiovascular: Normal rate and regular rhythm.   Pulmonary/Chest: Effort normal and breath sounds normal.  Abdominal: Soft. Bowel sounds are normal.  Musculoskeletal: Normal range of motion.  Neurological: She is oriented to person, place, and time.  Skin: Skin is warm and dry.  Lower back incision site intact without any redness, tenderness or drainage. Back brace in place.  Psychiatric: She has a normal mood and affect.    Labs reviewed: Basic Metabolic Panel:  Recent Labs  07/12/15 0806 09/21/15 0911 10/05/15  NA 140 139 141  K 3.0* 4.2 3.9  CL  --  103  --   CO2  --  29  --   GLUCOSE 116* 113*  --   BUN  --  12 12  CREATININE  --  1.04* 1.0  CALCIUM  --  9.4  --    Liver Function Tests:  Recent Labs  10/05/15  AST 23  ALT 23  ALKPHOS 106   10/10/15 wbc 5.3, hb 10.1, hct 31.5, plt 415  Procedures and Imaging Studies During Stay: Dg Lumbar Spine 2-3 Views  09/28/2015  CLINICAL DATA:  L1-2 PLIF EXAM: DG C-ARM 61-120 MIN; LUMBAR SPINE - 2-3 VIEW COMPARISON:  09/04/2013 FLUOROSCOPY TIME:  Radiation Exposure Index (as provided by the fluoroscopic device): Not available If the device does not provide the exposure index: Fluoroscopy Time:  28 seconds Number of Acquired Images:  2 FINDINGS: Prior fusion is again noted at L3-4. Pedicle screws  have been removed at L4 an new pedicle screws placed at L1 with interbody fusion at L1-2. IMPRESSION: L1-2 PLIF Electronically Signed   By: Inez Catalina M.D.   On: 09/28/2015 13:14   Dg C-arm 1-60 Min  09/28/2015  CLINICAL DATA:  L1-2 PLIF EXAM: DG C-ARM 61-120 MIN; LUMBAR SPINE - 2-3 VIEW COMPARISON:  09/04/2013 FLUOROSCOPY TIME:  Radiation Exposure Index (as provided by the fluoroscopic device): Not available If the device does not provide the exposure index: Fluoroscopy Time:  28 seconds Number of Acquired Images:  2 FINDINGS: Prior fusion is again noted at L3-4. Pedicle screws have been  removed at L4 an new pedicle screws placed at L1 with interbody fusion at L1-2. IMPRESSION: L1-2 PLIF Electronically Signed   By: Inez Catalina M.D.   On: 09/28/2015 13:14    Assessment/Plan:    1. Essential hypertension SBP remains elevated. Continue on Hyzaar 100-25mg  daily. Will increase Hydralazine to 25 mg Tablet three times daily. Also will provide script for clonidine 0.1 mg bid as needed for SBP > 190. D/C home with [facility] nurse for medication management and B/p check Notify PCP if BP remains elevated. Follow up with PCP  2. Gastroesophageal reflux disease without esophagitis Asymptomatic on Protonix.   3. Arthritis Pain well controlled on current regimen. Fall and safety precaution.Home PT/OT for gait, exercise, ROM and strengthening  4. Depression Coping well with recent death in the family.Continue Paroxetine daily and follow up with PCP.  5. Neuropathic pain of both legs S/p decompressive laminectomy and fusion with removal of hardware at L2-4. 09/28/2015 Continue on Oxycodone/Acetaminophen PRN  and Gabapentin   6. Slow transit constipation Had small BM 10/12/2015 no BM today. Will start on colace 100 mg Capsule daily. Follow up with PCP if no BM x 3 days.   7. Anemia  Recent Hgb 10.1 (10/10/2015) has improved previous level 9.8(10/05/2015). Continue with MVI and follow up with PCP for CBC/diff recheck.   Patient is being discharged with the following home health services:  PT/OT for gait, exercise, ROM and strengthenig.Home health Nurse for medication management and Blood pressure check notify PCP if sys B/P > 140  Patient has been advised to f/u with their PCP in 1-2 weeks to bring them up to date on their rehab stay.  Social services at facility was responsible for arranging this appointment.  Pt was provided with a 30 day supply of prescriptions for medications and refills must be obtained from their PCP.  For controlled substances, a more limited supply may be provided adequate  until PCP appointment only.  Future labs/tests needed:   Follow up with PCP for CBC/diff and HTN  Blanchie Serve, MD  [facility] [phone] (Monday-Friday 8 am - 5 pm) [phone] (afterhours)

## 2015-10-15 DIAGNOSIS — Z4789 Encounter for other orthopedic aftercare: Secondary | ICD-10-CM | POA: Diagnosis not present

## 2015-10-15 DIAGNOSIS — I10 Essential (primary) hypertension: Secondary | ICD-10-CM | POA: Diagnosis not present

## 2015-10-15 DIAGNOSIS — I739 Peripheral vascular disease, unspecified: Secondary | ICD-10-CM | POA: Diagnosis not present

## 2015-10-15 DIAGNOSIS — M150 Primary generalized (osteo)arthritis: Secondary | ICD-10-CM | POA: Diagnosis not present

## 2015-10-15 DIAGNOSIS — M797 Fibromyalgia: Secondary | ICD-10-CM | POA: Diagnosis not present

## 2015-10-15 DIAGNOSIS — F329 Major depressive disorder, single episode, unspecified: Secondary | ICD-10-CM | POA: Diagnosis not present

## 2015-10-15 DIAGNOSIS — G629 Polyneuropathy, unspecified: Secondary | ICD-10-CM | POA: Diagnosis not present

## 2015-10-18 DIAGNOSIS — Z4789 Encounter for other orthopedic aftercare: Secondary | ICD-10-CM | POA: Diagnosis not present

## 2015-10-18 DIAGNOSIS — I739 Peripheral vascular disease, unspecified: Secondary | ICD-10-CM | POA: Diagnosis not present

## 2015-10-18 DIAGNOSIS — G629 Polyneuropathy, unspecified: Secondary | ICD-10-CM | POA: Diagnosis not present

## 2015-10-18 DIAGNOSIS — I10 Essential (primary) hypertension: Secondary | ICD-10-CM | POA: Diagnosis not present

## 2015-10-18 DIAGNOSIS — M150 Primary generalized (osteo)arthritis: Secondary | ICD-10-CM | POA: Diagnosis not present

## 2015-10-18 DIAGNOSIS — M797 Fibromyalgia: Secondary | ICD-10-CM | POA: Diagnosis not present

## 2015-10-20 DIAGNOSIS — I10 Essential (primary) hypertension: Secondary | ICD-10-CM | POA: Diagnosis not present

## 2015-10-20 DIAGNOSIS — I739 Peripheral vascular disease, unspecified: Secondary | ICD-10-CM | POA: Diagnosis not present

## 2015-10-20 DIAGNOSIS — G629 Polyneuropathy, unspecified: Secondary | ICD-10-CM | POA: Diagnosis not present

## 2015-10-20 DIAGNOSIS — M150 Primary generalized (osteo)arthritis: Secondary | ICD-10-CM | POA: Diagnosis not present

## 2015-10-20 DIAGNOSIS — M797 Fibromyalgia: Secondary | ICD-10-CM | POA: Diagnosis not present

## 2015-10-20 DIAGNOSIS — Z4789 Encounter for other orthopedic aftercare: Secondary | ICD-10-CM | POA: Diagnosis not present

## 2015-10-24 DIAGNOSIS — I739 Peripheral vascular disease, unspecified: Secondary | ICD-10-CM | POA: Diagnosis not present

## 2015-10-24 DIAGNOSIS — Z4789 Encounter for other orthopedic aftercare: Secondary | ICD-10-CM | POA: Diagnosis not present

## 2015-10-24 DIAGNOSIS — M797 Fibromyalgia: Secondary | ICD-10-CM | POA: Diagnosis not present

## 2015-10-24 DIAGNOSIS — M150 Primary generalized (osteo)arthritis: Secondary | ICD-10-CM | POA: Diagnosis not present

## 2015-10-24 DIAGNOSIS — I10 Essential (primary) hypertension: Secondary | ICD-10-CM | POA: Diagnosis not present

## 2015-10-24 DIAGNOSIS — G629 Polyneuropathy, unspecified: Secondary | ICD-10-CM | POA: Diagnosis not present

## 2015-10-25 ENCOUNTER — Ambulatory Visit: Payer: Self-pay

## 2015-10-25 DIAGNOSIS — M150 Primary generalized (osteo)arthritis: Secondary | ICD-10-CM | POA: Diagnosis not present

## 2015-10-25 DIAGNOSIS — I739 Peripheral vascular disease, unspecified: Secondary | ICD-10-CM | POA: Diagnosis not present

## 2015-10-25 DIAGNOSIS — G629 Polyneuropathy, unspecified: Secondary | ICD-10-CM | POA: Diagnosis not present

## 2015-10-25 DIAGNOSIS — I10 Essential (primary) hypertension: Secondary | ICD-10-CM | POA: Diagnosis not present

## 2015-10-25 DIAGNOSIS — M797 Fibromyalgia: Secondary | ICD-10-CM | POA: Diagnosis not present

## 2015-10-25 DIAGNOSIS — Z4789 Encounter for other orthopedic aftercare: Secondary | ICD-10-CM | POA: Diagnosis not present

## 2015-10-27 DIAGNOSIS — I739 Peripheral vascular disease, unspecified: Secondary | ICD-10-CM | POA: Diagnosis not present

## 2015-10-27 DIAGNOSIS — M797 Fibromyalgia: Secondary | ICD-10-CM | POA: Diagnosis not present

## 2015-10-27 DIAGNOSIS — I10 Essential (primary) hypertension: Secondary | ICD-10-CM | POA: Diagnosis not present

## 2015-10-27 DIAGNOSIS — G629 Polyneuropathy, unspecified: Secondary | ICD-10-CM | POA: Diagnosis not present

## 2015-10-27 DIAGNOSIS — Z4789 Encounter for other orthopedic aftercare: Secondary | ICD-10-CM | POA: Diagnosis not present

## 2015-10-27 DIAGNOSIS — M150 Primary generalized (osteo)arthritis: Secondary | ICD-10-CM | POA: Diagnosis not present

## 2015-10-29 DIAGNOSIS — M150 Primary generalized (osteo)arthritis: Secondary | ICD-10-CM | POA: Diagnosis not present

## 2015-10-29 DIAGNOSIS — M797 Fibromyalgia: Secondary | ICD-10-CM | POA: Diagnosis not present

## 2015-10-29 DIAGNOSIS — I739 Peripheral vascular disease, unspecified: Secondary | ICD-10-CM | POA: Diagnosis not present

## 2015-10-29 DIAGNOSIS — Z4789 Encounter for other orthopedic aftercare: Secondary | ICD-10-CM | POA: Diagnosis not present

## 2015-10-29 DIAGNOSIS — G629 Polyneuropathy, unspecified: Secondary | ICD-10-CM | POA: Diagnosis not present

## 2015-10-29 DIAGNOSIS — I10 Essential (primary) hypertension: Secondary | ICD-10-CM | POA: Diagnosis not present

## 2015-10-31 DIAGNOSIS — I10 Essential (primary) hypertension: Secondary | ICD-10-CM | POA: Diagnosis not present

## 2015-10-31 DIAGNOSIS — Z4789 Encounter for other orthopedic aftercare: Secondary | ICD-10-CM | POA: Diagnosis not present

## 2015-10-31 DIAGNOSIS — M150 Primary generalized (osteo)arthritis: Secondary | ICD-10-CM | POA: Diagnosis not present

## 2015-10-31 DIAGNOSIS — M797 Fibromyalgia: Secondary | ICD-10-CM | POA: Diagnosis not present

## 2015-10-31 DIAGNOSIS — I739 Peripheral vascular disease, unspecified: Secondary | ICD-10-CM | POA: Diagnosis not present

## 2015-10-31 DIAGNOSIS — G629 Polyneuropathy, unspecified: Secondary | ICD-10-CM | POA: Diagnosis not present

## 2015-11-01 DIAGNOSIS — M150 Primary generalized (osteo)arthritis: Secondary | ICD-10-CM | POA: Diagnosis not present

## 2015-11-01 DIAGNOSIS — I10 Essential (primary) hypertension: Secondary | ICD-10-CM | POA: Diagnosis not present

## 2015-11-01 DIAGNOSIS — Z4789 Encounter for other orthopedic aftercare: Secondary | ICD-10-CM | POA: Diagnosis not present

## 2015-11-01 DIAGNOSIS — M797 Fibromyalgia: Secondary | ICD-10-CM | POA: Diagnosis not present

## 2015-11-01 DIAGNOSIS — I739 Peripheral vascular disease, unspecified: Secondary | ICD-10-CM | POA: Diagnosis not present

## 2015-11-01 DIAGNOSIS — G629 Polyneuropathy, unspecified: Secondary | ICD-10-CM | POA: Diagnosis not present

## 2015-11-02 DIAGNOSIS — M150 Primary generalized (osteo)arthritis: Secondary | ICD-10-CM | POA: Diagnosis not present

## 2015-11-02 DIAGNOSIS — I739 Peripheral vascular disease, unspecified: Secondary | ICD-10-CM | POA: Diagnosis not present

## 2015-11-02 DIAGNOSIS — I10 Essential (primary) hypertension: Secondary | ICD-10-CM | POA: Diagnosis not present

## 2015-11-02 DIAGNOSIS — Z4789 Encounter for other orthopedic aftercare: Secondary | ICD-10-CM | POA: Diagnosis not present

## 2015-11-02 DIAGNOSIS — G629 Polyneuropathy, unspecified: Secondary | ICD-10-CM | POA: Diagnosis not present

## 2015-11-02 DIAGNOSIS — M797 Fibromyalgia: Secondary | ICD-10-CM | POA: Diagnosis not present

## 2015-11-03 DIAGNOSIS — M5126 Other intervertebral disc displacement, lumbar region: Secondary | ICD-10-CM | POA: Diagnosis not present

## 2015-11-03 DIAGNOSIS — M150 Primary generalized (osteo)arthritis: Secondary | ICD-10-CM | POA: Diagnosis not present

## 2015-11-03 DIAGNOSIS — I739 Peripheral vascular disease, unspecified: Secondary | ICD-10-CM | POA: Diagnosis not present

## 2015-11-03 DIAGNOSIS — M797 Fibromyalgia: Secondary | ICD-10-CM | POA: Diagnosis not present

## 2015-11-03 DIAGNOSIS — Z4789 Encounter for other orthopedic aftercare: Secondary | ICD-10-CM | POA: Diagnosis not present

## 2015-11-03 DIAGNOSIS — G629 Polyneuropathy, unspecified: Secondary | ICD-10-CM | POA: Diagnosis not present

## 2015-11-03 DIAGNOSIS — I10 Essential (primary) hypertension: Secondary | ICD-10-CM | POA: Diagnosis not present

## 2015-11-03 DIAGNOSIS — M4806 Spinal stenosis, lumbar region: Secondary | ICD-10-CM | POA: Diagnosis not present

## 2015-11-03 DIAGNOSIS — Z6841 Body Mass Index (BMI) 40.0 and over, adult: Secondary | ICD-10-CM | POA: Diagnosis not present

## 2015-11-03 DIAGNOSIS — M7061 Trochanteric bursitis, right hip: Secondary | ICD-10-CM | POA: Diagnosis not present

## 2015-11-03 DIAGNOSIS — M5137 Other intervertebral disc degeneration, lumbosacral region: Secondary | ICD-10-CM | POA: Diagnosis not present

## 2015-11-04 DIAGNOSIS — G629 Polyneuropathy, unspecified: Secondary | ICD-10-CM | POA: Diagnosis not present

## 2015-11-04 DIAGNOSIS — M797 Fibromyalgia: Secondary | ICD-10-CM | POA: Diagnosis not present

## 2015-11-04 DIAGNOSIS — I739 Peripheral vascular disease, unspecified: Secondary | ICD-10-CM | POA: Diagnosis not present

## 2015-11-04 DIAGNOSIS — M150 Primary generalized (osteo)arthritis: Secondary | ICD-10-CM | POA: Diagnosis not present

## 2015-11-04 DIAGNOSIS — I10 Essential (primary) hypertension: Secondary | ICD-10-CM | POA: Diagnosis not present

## 2015-11-04 DIAGNOSIS — Z4789 Encounter for other orthopedic aftercare: Secondary | ICD-10-CM | POA: Diagnosis not present

## 2015-11-09 ENCOUNTER — Other Ambulatory Visit (HOSPITAL_COMMUNITY)
Admission: RE | Admit: 2015-11-09 | Discharge: 2015-11-09 | Disposition: A | Discharge status: Home / Self Care | Payer: Medicare Other | Source: Ambulatory Visit | Attending: Obstetrics and Gynecology | Admitting: Obstetrics and Gynecology

## 2015-11-09 ENCOUNTER — Other Ambulatory Visit: Payer: Self-pay | Admitting: Obstetrics and Gynecology

## 2015-11-09 DIAGNOSIS — M150 Primary generalized (osteo)arthritis: Secondary | ICD-10-CM | POA: Diagnosis not present

## 2015-11-09 DIAGNOSIS — Z1151 Encounter for screening for human papillomavirus (HPV): Secondary | ICD-10-CM | POA: Diagnosis not present

## 2015-11-09 DIAGNOSIS — G629 Polyneuropathy, unspecified: Secondary | ICD-10-CM | POA: Diagnosis not present

## 2015-11-09 DIAGNOSIS — I10 Essential (primary) hypertension: Secondary | ICD-10-CM | POA: Diagnosis not present

## 2015-11-09 DIAGNOSIS — I739 Peripheral vascular disease, unspecified: Secondary | ICD-10-CM | POA: Diagnosis not present

## 2015-11-09 DIAGNOSIS — Z113 Encounter for screening for infections with a predominantly sexual mode of transmission: Secondary | ICD-10-CM | POA: Diagnosis not present

## 2015-11-09 DIAGNOSIS — R1031 Right lower quadrant pain: Secondary | ICD-10-CM | POA: Diagnosis not present

## 2015-11-09 DIAGNOSIS — Z01419 Encounter for gynecological examination (general) (routine) without abnormal findings: Secondary | ICD-10-CM | POA: Insufficient documentation

## 2015-11-09 DIAGNOSIS — M797 Fibromyalgia: Secondary | ICD-10-CM | POA: Diagnosis not present

## 2015-11-09 DIAGNOSIS — Z4789 Encounter for other orthopedic aftercare: Secondary | ICD-10-CM | POA: Diagnosis not present

## 2015-11-10 ENCOUNTER — Other Ambulatory Visit: Payer: Self-pay | Admitting: Internal Medicine

## 2015-11-10 DIAGNOSIS — Z6841 Body Mass Index (BMI) 40.0 and over, adult: Secondary | ICD-10-CM | POA: Diagnosis not present

## 2015-11-10 DIAGNOSIS — M5137 Other intervertebral disc degeneration, lumbosacral region: Secondary | ICD-10-CM | POA: Diagnosis not present

## 2015-11-10 DIAGNOSIS — I10 Essential (primary) hypertension: Secondary | ICD-10-CM | POA: Diagnosis not present

## 2015-11-11 ENCOUNTER — Other Ambulatory Visit: Payer: Self-pay | Admitting: Internal Medicine

## 2015-11-14 DIAGNOSIS — G629 Polyneuropathy, unspecified: Secondary | ICD-10-CM | POA: Diagnosis not present

## 2015-11-14 DIAGNOSIS — I739 Peripheral vascular disease, unspecified: Secondary | ICD-10-CM | POA: Diagnosis not present

## 2015-11-14 DIAGNOSIS — I10 Essential (primary) hypertension: Secondary | ICD-10-CM | POA: Diagnosis not present

## 2015-11-14 DIAGNOSIS — Z4789 Encounter for other orthopedic aftercare: Secondary | ICD-10-CM | POA: Diagnosis not present

## 2015-11-14 DIAGNOSIS — M150 Primary generalized (osteo)arthritis: Secondary | ICD-10-CM | POA: Diagnosis not present

## 2015-11-14 DIAGNOSIS — M797 Fibromyalgia: Secondary | ICD-10-CM | POA: Diagnosis not present

## 2015-11-14 LAB — CYTOLOGY - PAP

## 2015-11-15 DIAGNOSIS — M150 Primary generalized (osteo)arthritis: Secondary | ICD-10-CM | POA: Diagnosis not present

## 2015-11-15 DIAGNOSIS — I10 Essential (primary) hypertension: Secondary | ICD-10-CM | POA: Diagnosis not present

## 2015-11-15 DIAGNOSIS — M797 Fibromyalgia: Secondary | ICD-10-CM | POA: Diagnosis not present

## 2015-11-15 DIAGNOSIS — Z0000 Encounter for general adult medical examination without abnormal findings: Secondary | ICD-10-CM | POA: Diagnosis not present

## 2015-11-15 DIAGNOSIS — I739 Peripheral vascular disease, unspecified: Secondary | ICD-10-CM | POA: Diagnosis not present

## 2015-11-15 DIAGNOSIS — N3010 Interstitial cystitis (chronic) without hematuria: Secondary | ICD-10-CM | POA: Diagnosis not present

## 2015-11-15 DIAGNOSIS — R102 Pelvic and perineal pain: Secondary | ICD-10-CM | POA: Diagnosis not present

## 2015-11-15 DIAGNOSIS — Z4789 Encounter for other orthopedic aftercare: Secondary | ICD-10-CM | POA: Diagnosis not present

## 2015-11-15 DIAGNOSIS — G629 Polyneuropathy, unspecified: Secondary | ICD-10-CM | POA: Diagnosis not present

## 2015-11-17 DIAGNOSIS — M4806 Spinal stenosis, lumbar region: Secondary | ICD-10-CM | POA: Diagnosis not present

## 2015-11-17 DIAGNOSIS — I10 Essential (primary) hypertension: Secondary | ICD-10-CM | POA: Diagnosis not present

## 2015-11-17 DIAGNOSIS — R1310 Dysphagia, unspecified: Secondary | ICD-10-CM | POA: Diagnosis not present

## 2015-11-18 DIAGNOSIS — N3010 Interstitial cystitis (chronic) without hematuria: Secondary | ICD-10-CM | POA: Diagnosis not present

## 2015-11-18 DIAGNOSIS — R102 Pelvic and perineal pain: Secondary | ICD-10-CM | POA: Diagnosis not present

## 2015-11-25 ENCOUNTER — Other Ambulatory Visit: Payer: Self-pay | Admitting: Neurosurgery

## 2015-11-25 DIAGNOSIS — M5136 Other intervertebral disc degeneration, lumbar region: Secondary | ICD-10-CM

## 2015-11-26 DIAGNOSIS — Z4789 Encounter for other orthopedic aftercare: Secondary | ICD-10-CM | POA: Diagnosis not present

## 2015-11-26 DIAGNOSIS — G629 Polyneuropathy, unspecified: Secondary | ICD-10-CM | POA: Diagnosis not present

## 2015-11-26 DIAGNOSIS — M150 Primary generalized (osteo)arthritis: Secondary | ICD-10-CM | POA: Diagnosis not present

## 2015-11-26 DIAGNOSIS — M797 Fibromyalgia: Secondary | ICD-10-CM | POA: Diagnosis not present

## 2015-11-26 DIAGNOSIS — I739 Peripheral vascular disease, unspecified: Secondary | ICD-10-CM | POA: Diagnosis not present

## 2015-11-26 DIAGNOSIS — I10 Essential (primary) hypertension: Secondary | ICD-10-CM | POA: Diagnosis not present

## 2015-11-28 DIAGNOSIS — N3010 Interstitial cystitis (chronic) without hematuria: Secondary | ICD-10-CM | POA: Diagnosis not present

## 2015-11-28 DIAGNOSIS — R3912 Poor urinary stream: Secondary | ICD-10-CM | POA: Diagnosis not present

## 2015-11-28 DIAGNOSIS — Z0000 Encounter for general adult medical examination without abnormal findings: Secondary | ICD-10-CM | POA: Diagnosis not present

## 2015-11-28 DIAGNOSIS — N3020 Other chronic cystitis without hematuria: Secondary | ICD-10-CM | POA: Diagnosis not present

## 2015-11-29 ENCOUNTER — Ambulatory Visit
Admission: RE | Admit: 2015-11-29 | Discharge: 2015-11-29 | Disposition: A | Discharge status: Home / Self Care | Payer: Medicare Other | Source: Ambulatory Visit

## 2015-11-29 DIAGNOSIS — Z1231 Encounter for screening mammogram for malignant neoplasm of breast: Secondary | ICD-10-CM | POA: Diagnosis not present

## 2015-11-29 DIAGNOSIS — M797 Fibromyalgia: Secondary | ICD-10-CM | POA: Diagnosis not present

## 2015-11-29 DIAGNOSIS — M150 Primary generalized (osteo)arthritis: Secondary | ICD-10-CM | POA: Diagnosis not present

## 2015-11-29 DIAGNOSIS — G629 Polyneuropathy, unspecified: Secondary | ICD-10-CM | POA: Diagnosis not present

## 2015-11-29 DIAGNOSIS — Z4789 Encounter for other orthopedic aftercare: Secondary | ICD-10-CM | POA: Diagnosis not present

## 2015-11-29 DIAGNOSIS — I739 Peripheral vascular disease, unspecified: Secondary | ICD-10-CM | POA: Diagnosis not present

## 2015-11-29 DIAGNOSIS — I10 Essential (primary) hypertension: Secondary | ICD-10-CM | POA: Diagnosis not present

## 2015-11-30 ENCOUNTER — Ambulatory Visit
Admission: RE | Admit: 2015-11-30 | Discharge: 2015-11-30 | Disposition: A | Discharge status: Home / Self Care | Payer: Medicare Other | Source: Ambulatory Visit | Attending: Neurosurgery | Admitting: Neurosurgery

## 2015-11-30 DIAGNOSIS — M5136 Other intervertebral disc degeneration, lumbar region: Secondary | ICD-10-CM | POA: Diagnosis not present

## 2015-12-01 ENCOUNTER — Other Ambulatory Visit: Payer: Self-pay | Admitting: Neurosurgery

## 2015-12-01 DIAGNOSIS — G629 Polyneuropathy, unspecified: Secondary | ICD-10-CM | POA: Diagnosis not present

## 2015-12-01 DIAGNOSIS — I10 Essential (primary) hypertension: Secondary | ICD-10-CM | POA: Diagnosis not present

## 2015-12-01 DIAGNOSIS — M797 Fibromyalgia: Secondary | ICD-10-CM | POA: Diagnosis not present

## 2015-12-01 DIAGNOSIS — Z4789 Encounter for other orthopedic aftercare: Secondary | ICD-10-CM | POA: Diagnosis not present

## 2015-12-01 DIAGNOSIS — I739 Peripheral vascular disease, unspecified: Secondary | ICD-10-CM | POA: Diagnosis not present

## 2015-12-01 DIAGNOSIS — M150 Primary generalized (osteo)arthritis: Secondary | ICD-10-CM | POA: Diagnosis not present

## 2015-12-01 DIAGNOSIS — S32009K Unspecified fracture of unspecified lumbar vertebra, subsequent encounter for fracture with nonunion: Secondary | ICD-10-CM

## 2015-12-02 ENCOUNTER — Other Ambulatory Visit: Payer: Self-pay

## 2015-12-06 ENCOUNTER — Ambulatory Visit
Admission: RE | Admit: 2015-12-06 | Discharge: 2015-12-06 | Disposition: A | Discharge status: Home / Self Care | Payer: Medicare Other | Source: Ambulatory Visit | Attending: Neurosurgery | Admitting: Neurosurgery

## 2015-12-06 DIAGNOSIS — M4327 Fusion of spine, lumbosacral region: Secondary | ICD-10-CM | POA: Diagnosis not present

## 2015-12-06 DIAGNOSIS — S32009K Unspecified fracture of unspecified lumbar vertebra, subsequent encounter for fracture with nonunion: Secondary | ICD-10-CM

## 2015-12-06 MED ORDER — GADOBENATE DIMEGLUMINE 529 MG/ML IV SOLN
20.0000 mL | Freq: Once | INTRAVENOUS | Status: AC | PRN
  Administered 2015-12-06: 20 mL via INTRAVENOUS

## 2015-12-13 ENCOUNTER — Other Ambulatory Visit: Payer: Self-pay

## 2015-12-13 DIAGNOSIS — I10 Essential (primary) hypertension: Secondary | ICD-10-CM | POA: Diagnosis not present

## 2015-12-13 DIAGNOSIS — R5383 Other fatigue: Secondary | ICD-10-CM | POA: Diagnosis not present

## 2015-12-13 DIAGNOSIS — G4733 Obstructive sleep apnea (adult) (pediatric): Secondary | ICD-10-CM | POA: Diagnosis not present

## 2015-12-13 DIAGNOSIS — R5381 Other malaise: Secondary | ICD-10-CM | POA: Diagnosis not present

## 2015-12-25 ENCOUNTER — Emergency Department (HOSPITAL_COMMUNITY): Payer: Medicare Other

## 2015-12-25 ENCOUNTER — Emergency Department (HOSPITAL_COMMUNITY)
Admission: EM | Admit: 2015-12-25 | Discharge: 2015-12-25 | Disposition: A | Discharge status: Home / Self Care | Payer: Medicare Other | Attending: Emergency Medicine | Admitting: Emergency Medicine

## 2015-12-25 ENCOUNTER — Encounter (HOSPITAL_COMMUNITY): Payer: Self-pay | Admitting: Emergency Medicine

## 2015-12-25 DIAGNOSIS — Z87891 Personal history of nicotine dependence: Secondary | ICD-10-CM | POA: Diagnosis not present

## 2015-12-25 DIAGNOSIS — Z91040 Latex allergy status: Secondary | ICD-10-CM | POA: Diagnosis not present

## 2015-12-25 DIAGNOSIS — F419 Anxiety disorder, unspecified: Secondary | ICD-10-CM | POA: Diagnosis not present

## 2015-12-25 DIAGNOSIS — Z79899 Other long term (current) drug therapy: Secondary | ICD-10-CM | POA: Diagnosis not present

## 2015-12-25 DIAGNOSIS — M1990 Unspecified osteoarthritis, unspecified site: Secondary | ICD-10-CM | POA: Diagnosis not present

## 2015-12-25 DIAGNOSIS — Z86718 Personal history of other venous thrombosis and embolism: Secondary | ICD-10-CM | POA: Diagnosis not present

## 2015-12-25 DIAGNOSIS — Z8614 Personal history of Methicillin resistant Staphylococcus aureus infection: Secondary | ICD-10-CM | POA: Diagnosis not present

## 2015-12-25 DIAGNOSIS — Z880 Allergy status to penicillin: Secondary | ICD-10-CM | POA: Diagnosis not present

## 2015-12-25 DIAGNOSIS — Z9889 Other specified postprocedural states: Secondary | ICD-10-CM | POA: Insufficient documentation

## 2015-12-25 DIAGNOSIS — J45909 Unspecified asthma, uncomplicated: Secondary | ICD-10-CM | POA: Insufficient documentation

## 2015-12-25 DIAGNOSIS — M797 Fibromyalgia: Secondary | ICD-10-CM | POA: Diagnosis not present

## 2015-12-25 DIAGNOSIS — M545 Low back pain: Secondary | ICD-10-CM | POA: Diagnosis present

## 2015-12-25 DIAGNOSIS — G8929 Other chronic pain: Secondary | ICD-10-CM | POA: Insufficient documentation

## 2015-12-25 DIAGNOSIS — R079 Chest pain, unspecified: Secondary | ICD-10-CM | POA: Insufficient documentation

## 2015-12-25 DIAGNOSIS — F329 Major depressive disorder, single episode, unspecified: Secondary | ICD-10-CM | POA: Diagnosis not present

## 2015-12-25 DIAGNOSIS — R52 Pain, unspecified: Secondary | ICD-10-CM | POA: Diagnosis not present

## 2015-12-25 DIAGNOSIS — I10 Essential (primary) hypertension: Secondary | ICD-10-CM | POA: Diagnosis not present

## 2015-12-25 DIAGNOSIS — M5441 Lumbago with sciatica, right side: Secondary | ICD-10-CM | POA: Diagnosis not present

## 2015-12-25 DIAGNOSIS — G629 Polyneuropathy, unspecified: Secondary | ICD-10-CM | POA: Diagnosis not present

## 2015-12-25 DIAGNOSIS — D509 Iron deficiency anemia, unspecified: Secondary | ICD-10-CM | POA: Insufficient documentation

## 2015-12-25 DIAGNOSIS — K219 Gastro-esophageal reflux disease without esophagitis: Secondary | ICD-10-CM | POA: Insufficient documentation

## 2015-12-25 DIAGNOSIS — M549 Dorsalgia, unspecified: Secondary | ICD-10-CM | POA: Diagnosis not present

## 2015-12-25 LAB — CBC
HCT: 36.9 % (ref 36.0–46.0)
Hemoglobin: 12.2 g/dL (ref 12.0–15.0)
MCH: 29.3 pg (ref 26.0–34.0)
MCHC: 33.1 g/dL (ref 30.0–36.0)
MCV: 88.5 fL (ref 78.0–100.0)
Platelets: 331 10*3/uL (ref 150–400)
RBC: 4.17 MIL/uL (ref 3.87–5.11)
RDW: 14.3 % (ref 11.5–15.5)
WBC: 4.3 10*3/uL (ref 4.0–10.5)

## 2015-12-25 LAB — BASIC METABOLIC PANEL
Anion gap: 10 (ref 5–15)
BUN: 14 mg/dL (ref 6–20)
CO2: 27 mmol/L (ref 22–32)
Calcium: 8.9 mg/dL (ref 8.9–10.3)
Chloride: 101 mmol/L (ref 101–111)
Creatinine, Ser: 1.04 mg/dL — ABNORMAL HIGH (ref 0.44–1.00)
GFR calc Af Amer: >60 mL/min (ref >60)
GFR calc non Af Amer: 54 mL/min — ABNORMAL LOW (ref >60)
Glucose, Bld: 101 mg/dL — ABNORMAL HIGH (ref 65–99)
Potassium: 3.7 mmol/L (ref 3.5–5.1)
Sodium: 138 mmol/L (ref 135–145)

## 2015-12-25 LAB — I-STAT TROPONIN, ED: Troponin i, poc: 0 ng/mL (ref 0.00–0.08)

## 2015-12-25 MED ORDER — DIAZEPAM 5 MG/ML IJ SOLN
5.0000 mg | Freq: Once | INTRAMUSCULAR | Status: AC
  Administered 2015-12-25: 5 mg via INTRAVENOUS
  Filled 2015-12-25: qty 2

## 2015-12-25 MED ORDER — METHOCARBAMOL 1000 MG/10ML IJ SOLN
500.0000 mg | Freq: Once | INTRAVENOUS | Status: AC
  Administered 2015-12-25: 500 mg via INTRAVENOUS
  Filled 2015-12-25: qty 5

## 2015-12-25 MED ORDER — METHOCARBAMOL 1000 MG/10ML IJ SOLN
500.0000 mg | Freq: Once | INTRAMUSCULAR | Status: DC
  Filled 2015-12-25: qty 5

## 2015-12-25 MED ORDER — CYCLOBENZAPRINE HCL 10 MG PO TABS: 10.0000 mg | ORAL_TABLET | Freq: Two times a day (BID) | ORAL | Status: DC | PRN

## 2015-12-25 MED ORDER — METHYLPREDNISOLONE SODIUM SUCC 125 MG IJ SOLR
60.0000 mg | Freq: Once | INTRAMUSCULAR | Status: AC
Start: 2015-12-25 — End: 2015-12-25
  Administered 2015-12-25: 60 mg via INTRAVENOUS
  Filled 2015-12-25: qty 2

## 2015-12-25 MED ORDER — DIAZEPAM 5 MG PO TABS: 5.0000 mg | ORAL_TABLET | Freq: Once | ORAL | Status: DC

## 2015-12-25 NOTE — ED Notes (Signed)
PA-C at bedside 

## 2015-12-25 NOTE — ED Provider Notes (Signed)
CSN: [id number]     Arrival date & time 12/25/15  1549 History   First MD Initiated Contact with Patient 12/25/15 1554     Chief Complaint  Patient presents with  . Back Pain  . Chest Pain   (Consider location/radiation/quality/duration/timing/severity/associated sxs/prior Treatment) HPI  70 y.o. female with a hx of Chronic Back Pain, HTN, Lumbar Fusion in January, presents to the Emergency Department today complaining of back pain x several weeks. Notes pain is radiating down bilateral lower legs. Notes pain is "25/10" and feels like a dull ache with occasional pins and needles on the right side of her back. No fevers. No loss of bowel or bladder function. No numbness/tingling. No headaches. No N/V. No diaphoresis. I asked that patient if she was experiencing chest pain at all today and she declined and stated that it is mainly her lower back. Has tried Percocet, Icy/Hot with minimal relief. No other symptoms noted. Last Percocet was around 11am and was half of a tablet.     MRI Lumbar Spine 12-06-15 showed no acute abnormalities   Lumbar Surgery- Dr. Saintclair Halsted  Past Medical History  Diagnosis Date  . Anxiety   . Dysphonia     intermittent "voice changes"  . Chronic back pain     lumbar steroid injection's  . Seasonal allergies   . Fibromyalgia   . Weakness     both hands and feet  . Urinary frequency   . Glaucoma   . History of MRSA infection 2011  . History of DVT (deep vein thrombosis) 1989    LEFT UPPER ARM  . Hypertension   . Short of breath on exertion   . Depression   . GERD (gastroesophageal reflux disease)   . Pelvic pain in female   . Arthritis     ddd- RA  . Irregular heart rate     "years ago"  . Peripheral vascular disease ([place])     "poor circulation"  . Mild obstructive sleep apnea     per study 02-07-2006 - no cpap  . Asthma     "sleeping asthma"  . History of hiatal hernia   . Headache   . Neuropathy (HCC)     feet  . Low iron   . Constipation    Past  Surgical History  Procedure Laterality Date  . Abdominal hysterectomy  1978  . Nose surgery  2007  . Mass excision Left 02/20/2013    Procedure: EXCISION LEFT BUTTOCK  MASS;  Surgeon: Adin Hector, MD;  Location: [facility];  Service: General;  Laterality: Left;  . Esophagogastroduodenoscopy N/A 07/15/2013    Procedure: ESOPHAGOGASTRODUODENOSCOPY (EGD);  Surgeon: Winfield Cunas., MD;  Location: [facility];  Service: Endoscopy;  Laterality: N/A;  need xray  . Abdominal adhesions removed    . Colonoscopy    . Bunionectomy Left 2008  . Shoulder open rotator cuff repair Left 05/05/2014    Procedure: OPEN ACROMIONECTOMY AND OPEN REPAIR OF ROTATOR CUFF, TISSUEMEND GRAFT WITH ANCHOR ;  Surgeon: Tobi Bastos, MD;  Location: [facility];  Service: Orthopedics;  Laterality: Left;  . Esophageal manometry N/A 11/22/2014    Procedure: ESOPHAGEAL MANOMETRY (EM);  Surgeon: Winfield Cunas., MD;  Location: [facility];  Service: Endoscopy;  Laterality: N/A;  . Lumbar fusion  2014    L4 -- L5  . Removal hardware l4-l5/  bilateral laminectomy l2 - l5 and fusion  06-12-2011  . Laparoscopic cholecystectomy  01-11-2006  . Cysto with  hydrodistension N/A 07/12/2015    Procedure: CYSTOSCOPY/HYDRODISTENSION;  Surgeon: Bjorn Loser, MD;  Location: [facility];  Service: Urology;  Laterality: N/A;  . Eye surgery Bilateral     cataract surgery with lens implants  . Tonsillectomy     Family History  Problem Relation Age of Onset  . Hypertension Mother   . Cancer Mother     cervix  . Kidney disease Mother   . Hypertension Sister   . Cancer Sister     polyps  . Kidney disease Brother   . Cancer Daughter     leukemia  . Hypertension Brother    Social History  Substance Use Topics  . Smoking status: Former Smoker    Quit date: 07/10/1969  . Smokeless tobacco: Never Used  . Alcohol Use: Yes     Comment: rare   OB History    No data available     Review of Systems ROS  reviewed and all are negative for acute change except as noted in the HPI.  Allergies  Latex and Penicillins  Home Medications   Prior to Admission medications   Medication Sig Start Date End Date Taking? Authorizing Provider  acetaminophen (TYLENOL) 500 MG tablet Take 1,000 mg by mouth every 8 (eight) hours as needed for pain.    Historical Provider, MD  azelaic acid (AZELEX) 20 % cream Apply 1 application topically 2 (two) times daily. After skin is thoroughly washed and patted dry, gently but thoroughly massage a thin film of azelaic acid cream into the affected area twice daily, in the morning and evening.    Historical Provider, MD  B Complex Vitamins (VITAMIN B COMPLEX PO) Take 1 tablet by mouth daily.    Historical Provider, MD  calcium citrate-vitamin D (CITRACAL+D) 315-200 MG-UNIT per tablet Take 1 tablet by mouth daily.    Historical Provider, MD  cloNIDine (CATAPRES) 0.1 MG tablet Take 0.1 mg by mouth every 12 (twelve) hours as needed. For Systolic > [id number]    Historical Provider, MD  diphenhydrAMINE (BENADRYL) 25 mg capsule Take 25 mg by mouth 2 (two) times daily as needed for allergies.    Historical Provider, MD  ESTRACE VAGINAL 0.1 MG/GM vaginal cream Place 1 application vaginally 2 (two) times a week.  08/16/15   Historical Provider, MD  gabapentin (NEURONTIN) 300 MG capsule Take 300 mg by mouth 2 (two) times daily as needed.    Historical Provider, MD  glycopyrrolate (ROBINUL) 2 MG tablet Take 1 mg by mouth 2 (two) times daily.  12/30/12   Historical Provider, MD  hydrALAZINE (APRESOLINE) 25 MG tablet Take 25 mg by mouth 3 (three) times daily.    Historical Provider, MD  L-Methylfolate-B6-B12 (METANX PO) Take 1 tablet by mouth 2 (two) times daily.    Historical Provider, MD  losartan-hydrochlorothiazide (HYZAAR) 100-25 MG per tablet Take 1 tablet by mouth every morning.  01/14/13   Historical Provider, MD  Multiple Vitamin (MULTIVITAMIN WITH MINERALS) TABS Take 1 tablet by mouth  daily.    Historical Provider, MD  oxyCODONE-acetaminophen (PERCOCET/ROXICET) 5-325 MG tablet Take 1-2 tablets by mouth every 4 (four) hours as needed for moderate pain. 10/03/15   Kary Kos, MD  pantoprazole (PROTONIX) 40 MG tablet Take 40 mg by mouth daily.    Historical Provider, MD  PARoxetine (PAXIL) 30 MG tablet Take 30 mg by mouth daily.  01/14/13   Historical Provider, MD  potassium chloride (K-DUR,KLOR-CON) 10 MEQ tablet Take 20 mEq by mouth daily.  Historical Provider, MD  silodosin (RAPAFLO) 8 MG CAPS capsule Take 8 mg by mouth daily as needed (for bladder).     Historical Provider, MD  vitamin B-12 (CYANOCOBALAMIN) 1000 MCG tablet Take 1,000 mcg by mouth at bedtime.    Historical Provider, MD  vitamin E (VITAMIN E) 400 UNIT capsule Take 400 Units by mouth daily.    Historical Provider, MD   BP 132/63 mmHg  Pulse 75  Temp(Src) 98.5 F (36.9 C) (Oral)  Resp 11  SpO2 98%   Physical Exam  Constitutional: She is oriented to person, place, and time. She appears well-developed and well-nourished.  HENT:  Head: Normocephalic and atraumatic.  Eyes: EOM are normal. Pupils are equal, round, and reactive to light.  Neck: Normal range of motion. Neck supple. No tracheal deviation present.  Cardiovascular: Normal rate, regular rhythm, normal heart sounds and intact distal pulses.   No murmur heard. Pulmonary/Chest: Effort normal and breath sounds normal. No respiratory distress. She has no wheezes. She has no rales. She exhibits no tenderness.  Abdominal: Soft. There is no tenderness.  Musculoskeletal: Normal range of motion.       Lumbar back: Normal. She exhibits normal range of motion, no tenderness, no swelling, no deformity and no pain.  Noted surgical scars along lumbar spine. Positive straight leg raise BLE. TTP right lower back.   Neurological: She is alert and oriented to person, place, and time.  Skin: Skin is warm and dry.  Psychiatric: She has a normal mood and affect. Her  behavior is normal. Thought content normal.  Nursing note and vitals reviewed.  ED Course  Procedures (including critical care time) Labs Review Labs Reviewed  BASIC METABOLIC PANEL - Abnormal; Notable for the following:    Glucose, Bld 101 (*)    Creatinine, Ser 1.04 (*)    GFR calc non Af Amer 54 (*)    All other components within normal limits  CBC  I-STAT TROPOININ, ED   Imaging Review Dg Chest 2 View  12/25/2015  CLINICAL DATA:  Low back pain, chest pain EXAM: CHEST  2 VIEW COMPARISON:  04/21/2014 FINDINGS: The heart size and mediastinal contours are within normal limits. Both lungs are clear. The visualized skeletal structures are unremarkable. IMPRESSION: No active cardiopulmonary disease. Electronically Signed   By: Skipper Cliche M.D.   On: 12/25/2015 17:42   I have personally reviewed and evaluated these images and lab results as part of my medical decision-making.   EKG Interpretation None      MDM  I have reviewed and evaluated the relevant laboratory values.I have reviewed and evaluated the relevant imaging studies.I personally evaluated and interpreted the relevant EKG. I have reviewed the relevant previous healthcare records.I have reviewed EMS Documentation.I obtained HPI from historian. Patient discussed with supervising physician  ED Course:  Assessment: Pt is a 13yF with hx Chronic Back Pain, HTN, Lumbar Fusion in January who presents with right lower back pain x several weeks. On exam, pt in NAD. Nontoxic/nonseptic appearing. VSS. Afebrile. Lungs CTA. Heart RRR. Abdomen nontender soft. No neurological deficits appreciated. Patient is ambulatory. No warning symptoms of back pain including: fecal incontinence, urinary retention or overflow incontinence, night sweats, waking from sleep with back pain, unexplained fevers or weight loss, h/o cancer, IVDU, recent trauma. No concern for cauda equina, epidural abscess, or other serious cause of back pain. Labs show no  acute abnormalities. CXR unremarkable. Given Valium, Robaxin, Solumedrol in ED with improvement of symptoms. Plan is to DC  home with follow up to PCP for further management of symptoms. Conservative measures such as rest, ice/heat and pain medicine indicated with PCP follow-up if no improvement with conservative management. At time of discharge, Patient is in no acute distress. Vital Signs are stable. Patient is able to ambulate. Patient able to tolerate PO.    Disposition/Plan:  DC Home Additional Verbal discharge instructions given and discussed with patient.  Pt Instructed to f/u with PCP in the next 48 hours for evaluation and treatment of symptoms. Return precautions given Pt acknowledges and agrees with plan  Supervising Physician Pattricia Boss, MD   Final diagnoses:  Right-sided low back pain with right-sided sciatica      Shary Decamp, PA-C 12/26/15 0007  Pattricia Boss, MD 12/26/15 [id number]

## 2015-12-25 NOTE — Discharge Instructions (Signed)
Please read and follow all provided instructions.  Your diagnoses today include:  1. Right-sided low back pain with right-sided sciatica    Tests performed today include:  Vital signs - see below for your results today  Medications prescribed:   Take any prescribed medications only as directed.  Home care instructions:   Follow any educational materials contained in this packet  Please rest, use ice or heat on your back for the next several days  Do not lift, push, pull anything more than 10 pounds for the next week  Follow-up instructions: Please follow-up with your primary care provider in the next 1 week for further evaluation of your symptoms.   Return instructions:  SEEK IMMEDIATE MEDICAL ATTENTION IF YOU HAVE:  New numbness, tingling, weakness, or problem with the use of your arms or legs  Severe back pain not relieved with medications  Loss control of your bowels or bladder  Increasing pain in any areas of the body (such as chest or abdominal pain)  Shortness of breath, dizziness, or fainting.   Worsening nausea (feeling sick to your stomach), vomiting, fever, or sweats  Any other emergent concerns regarding your health   Additional Information:  Your vital signs today were: BP 153/84 mmHg   Pulse 66   Temp(Src) 98.5 F (36.9 C) (Oral)   Resp 10   SpO2 99% If your blood pressure (BP) was elevated above 135/85 this visit, please have this repeated by your doctor within one month. --------------

## 2015-12-25 NOTE — ED Notes (Signed)
Went over discharge instructions with patient - pt and family both seem upset about being discharged - pt then states she was told to come to the ED so they could help her with her pain. Pt states her pain is the same as when she came in. PA-C made aware.

## 2015-12-25 NOTE — ED Notes (Signed)
PER [facility]: Patient to ED from home c/o lower back pain radiating down legs. Pt hx sciatica - had lumbar surgery 01/17 and has had issues with pain management since - states percocet does not help so she's not been taking anything. Pt also c/o intermittent L sided chest pressure since yesterday (has experienced before - PCP wants her to f/u with cardiologist). Pt denies SOB, diaphoresis, N/V, dizziness. Pt did receive 324 ASA PTA via EMS, VS: 126/80, HR 70 NSR, RR 16, 96% RA. Pt A&O x 4.

## 2015-12-27 ENCOUNTER — Other Ambulatory Visit: Payer: Self-pay | Admitting: Neurosurgery

## 2015-12-27 DIAGNOSIS — M4806 Spinal stenosis, lumbar region: Secondary | ICD-10-CM | POA: Diagnosis not present

## 2015-12-27 DIAGNOSIS — S32009K Unspecified fracture of unspecified lumbar vertebra, subsequent encounter for fracture with nonunion: Secondary | ICD-10-CM | POA: Diagnosis not present

## 2015-12-28 ENCOUNTER — Other Ambulatory Visit (HOSPITAL_COMMUNITY): Payer: Self-pay | Admitting: *Deleted

## 2015-12-28 ENCOUNTER — Ambulatory Visit
Admission: RE | Admit: 2015-12-28 | Discharge: 2015-12-28 | Disposition: A | Discharge status: Home / Self Care | Payer: Medicare Other | Source: Ambulatory Visit | Attending: Neurosurgery | Admitting: Neurosurgery

## 2015-12-28 DIAGNOSIS — S32009K Unspecified fracture of unspecified lumbar vertebra, subsequent encounter for fracture with nonunion: Secondary | ICD-10-CM

## 2015-12-28 DIAGNOSIS — S32048A Other fracture of fourth lumbar vertebra, initial encounter for closed fracture: Secondary | ICD-10-CM | POA: Diagnosis not present

## 2015-12-28 DIAGNOSIS — S32038A Other fracture of third lumbar vertebra, initial encounter for closed fracture: Secondary | ICD-10-CM | POA: Diagnosis not present

## 2015-12-28 NOTE — Pre-Procedure Instructions (Signed)
Amy [name]  12/28/2015      [facility] [name], [place] [address] [place] [address] Phone: [phone] Fax: [phone]    Your procedure is scheduled on Monday, January 02, 2016 at 1:50 PM.   Report to [facility] Entrance "A" Admitting Office at 11:50 AM.   Call this number if you have problems the morning of surgery: [phone]   Any questions prior to day of surgery, please call [phone] between 8 & 4 PM.   Remember:  Do not eat food or drink liquids after midnight Sunday, 01/01/16.  Take these medicines the morning of surgery with A SIP OF WATER: Hydralazine (Apresoline), Pantoprazole (Protonix), Paroxetine (Paxil). Clonidine - if needed, Oxycodone - if needed  Stop Multivitamins and Vitamin E as of today.    Do not wear jewelry, make-up or nail polish.  Do not wear lotions, powders, or perfumes.  You may wear deodorant.  Do not shave 48 hours prior to surgery.   Do not bring valuables to the hospital.  [facility] is not responsible for any belongings or valuables.  Contacts, dentures or bridgework may not be worn into surgery.  Leave your suitcase in the car.  After surgery it may be brought to your room.  For patients admitted to the hospital, discharge time will be determined by your treatment team.  Special instructions:  [place] - Preparing for Surgery  Before surgery, you can play an important role.  Because skin is not sterile, your skin needs to be as free of germs as possible.  You can reduce the number of germs on you skin by washing with CHG (chlorahexidine gluconate) soap before surgery.  CHG is an antiseptic cleaner which kills germs and bonds with the skin to continue killing germs even after washing.  Please DO NOT use if you have an allergy to CHG or antibacterial soaps.  If your skin becomes reddened/irritated stop using the CHG and  inform your nurse when you arrive at Short Stay.  Do not shave (including legs and underarms) for at least 48 hours prior to the first CHG shower.  You may shave your face.  Please follow these instructions carefully:   1.  Shower with CHG Soap the night before surgery and the                                morning of Surgery.  2.  If you choose to wash your hair, wash your hair first as usual with your       normal shampoo.  3.  After you shampoo, rinse your hair and body thoroughly to remove the                      Shampoo.  4.  Use CHG as you would any other liquid soap.  You can apply chg directly       to the skin and wash gently with scrungie or a clean washcloth.  5.  Apply the CHG Soap to your body ONLY FROM THE NECK DOWN.        Do not use on open wounds or open sores.  Avoid contact with your eyes, ears, mouth and genitals (private parts).  Wash genitals (private parts) with your normal soap.  6.  Wash thoroughly, paying  special attention to the area where your surgery        will be performed.  7.  Thoroughly rinse your body with warm water from the neck down.  8.  DO NOT shower/wash with your normal soap after using and rinsing off       the CHG Soap.  9.  Pat yourself dry with a clean towel.            10.  Wear clean pajamas.            11.  Place clean sheets on your bed the night of your first shower and do not        sleep with pets.  Day of Surgery  Do not apply any lotions the morning of surgery.  Please wear clean clothes to the hospital.   Please read over the following fact sheets that you were given. Pain Booklet, Coughing and Deep Breathing, MRSA Information and Surgical Site Infection Prevention

## 2015-12-29 ENCOUNTER — Encounter (HOSPITAL_COMMUNITY)
Admission: RE | Admit: 2015-12-29 | Discharge: 2015-12-29 | Disposition: A | Discharge status: Home / Self Care | Payer: Medicare Other | Source: Ambulatory Visit | Attending: Neurosurgery | Admitting: Neurosurgery

## 2015-12-29 ENCOUNTER — Encounter (HOSPITAL_COMMUNITY): Payer: Self-pay

## 2015-12-29 DIAGNOSIS — M549 Dorsalgia, unspecified: Secondary | ICD-10-CM | POA: Insufficient documentation

## 2015-12-29 DIAGNOSIS — Z01818 Encounter for other preprocedural examination: Secondary | ICD-10-CM | POA: Insufficient documentation

## 2015-12-29 LAB — CBC
HCT: 36.8 % (ref 36.0–46.0)
Hemoglobin: 11.7 g/dL — ABNORMAL LOW (ref 12.0–15.0)
MCH: 27.9 pg (ref 26.0–34.0)
MCHC: 31.8 g/dL (ref 30.0–36.0)
MCV: 87.8 fL (ref 78.0–100.0)
Platelets: 335 10*3/uL (ref 150–400)
RBC: 4.19 MIL/uL (ref 3.87–5.11)
RDW: 14.4 % (ref 11.5–15.5)
WBC: 4.3 10*3/uL (ref 4.0–10.5)

## 2015-12-29 LAB — BASIC METABOLIC PANEL
Anion gap: 11 (ref 5–15)
BUN: 17 mg/dL (ref 6–20)
CO2: 29 mmol/L (ref 22–32)
Calcium: 9.1 mg/dL (ref 8.9–10.3)
Chloride: 100 mmol/L — ABNORMAL LOW (ref 101–111)
Creatinine, Ser: 0.91 mg/dL (ref 0.44–1.00)
GFR calc Af Amer: >60 mL/min (ref >60)
GFR calc non Af Amer: >60 mL/min (ref >60)
Glucose, Bld: 111 mg/dL — ABNORMAL HIGH (ref 65–99)
Potassium: 3.6 mmol/L (ref 3.5–5.1)
Sodium: 140 mmol/L (ref 135–145)

## 2015-12-29 LAB — SURGICAL PCR SCREEN
MRSA, PCR: NEGATIVE
Staphylococcus aureus: NEGATIVE

## 2015-12-29 IMAGING — MR MR LUMBAR SPINE WO/W CM
5 of 8 series · 24 of 48 positions shown · IV contrast (multihance)
Comparison: CT 03/24/2014.

CLINICAL DATA: Abdominal, back, bilateral thigh and buttocks pain
for 3 months. No known injury. History of back surgery in [DATE]
and 4355.

EXAM:
MRI LUMBAR SPINE WITHOUT AND WITH CONTRAST
TECHNIQUE: Multiplanar and multiecho pulse sequences of the lumbar spine were
obtained without and with intravenous contrast.
CONTRAST:  20 ml MultiHance.

[Series 3: T1 · sagittal · 4.0mm · 0.55mm/px · 3 of 12 slices shown (1 of 2)]
[im 1/12]
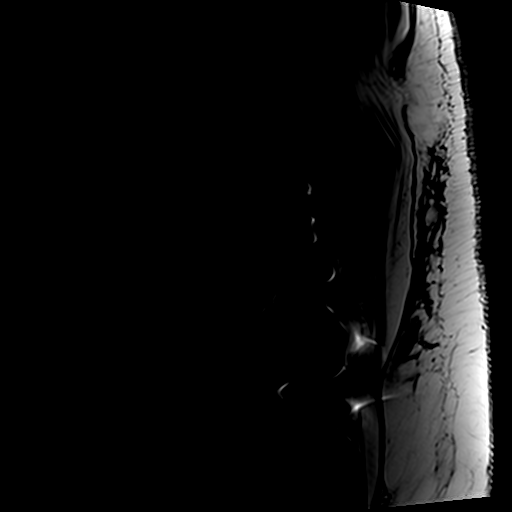
[im 6/12]
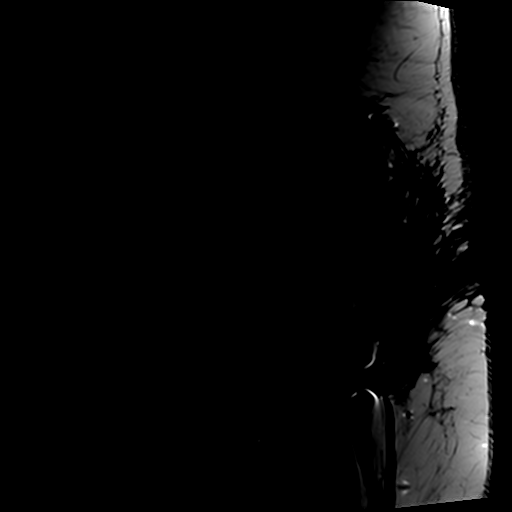
[im 12/12]
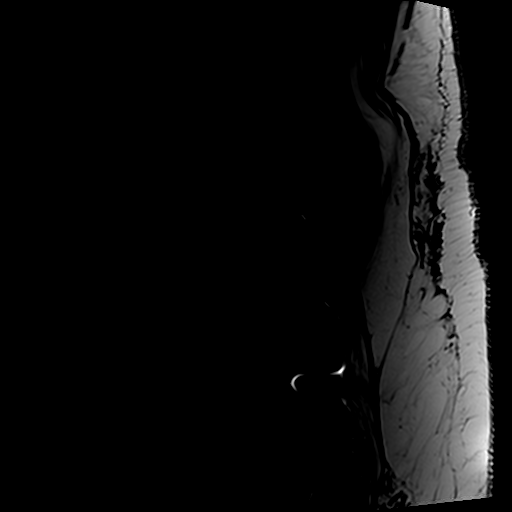

[Series 5: T2 · axial · 4.0mm · 0.70mm/px · z∈[-64,+129]mm · 8 of 33 slices shown (1 of 2)]
[im 1/33]
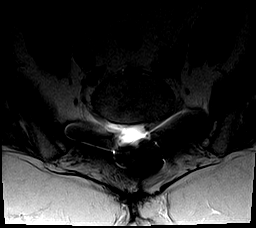
[im 4/33]
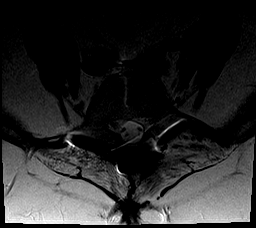
[im 11/33]
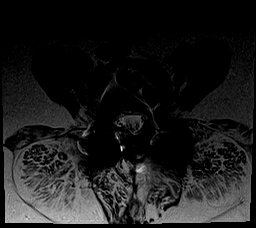
[im 15/33]
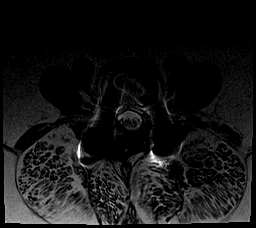
[im 18/33]
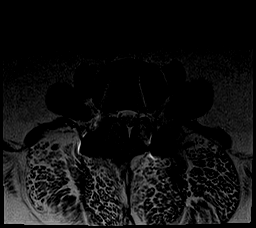
[im 22/33]
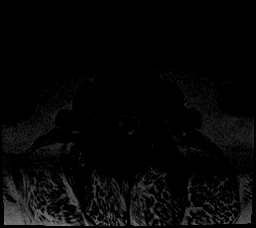
[im 29/33]
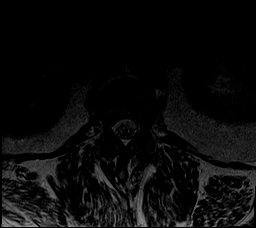
[im 33/33]
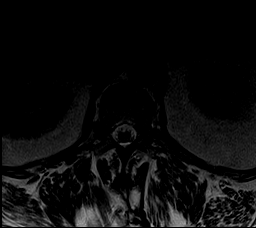

[Series 6: T1 · axial · 4.0mm · 0.35mm/px · z∈[-64,+129]mm · 8 of 33 slices shown (2 of 2)]
[im 1/33]
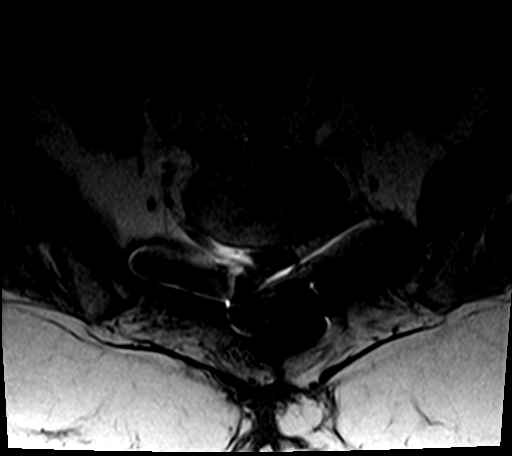
[im 4/33]
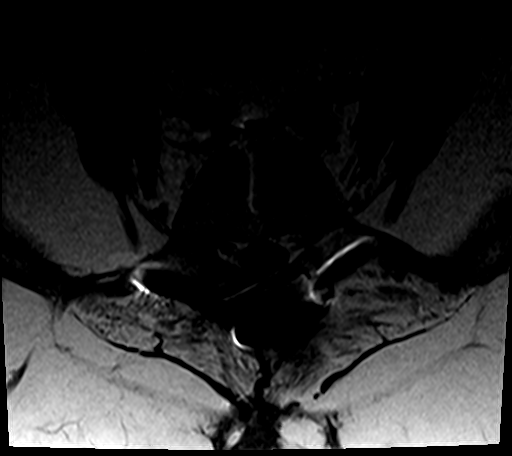
[im 11/33]
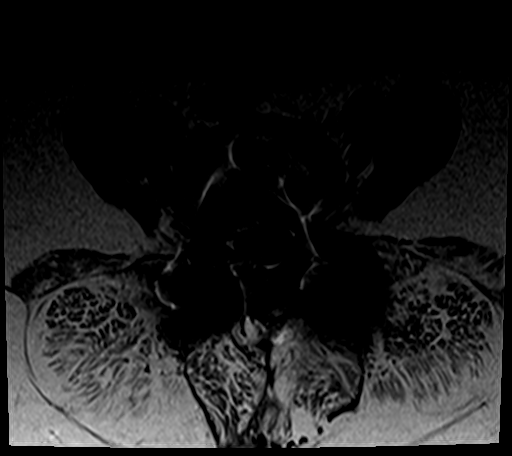
[im 15/33]
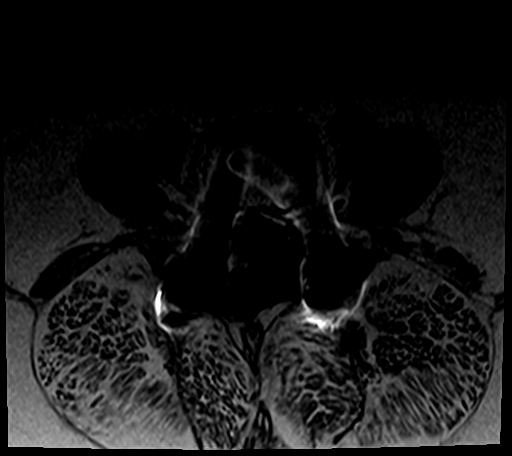
[im 18/33]
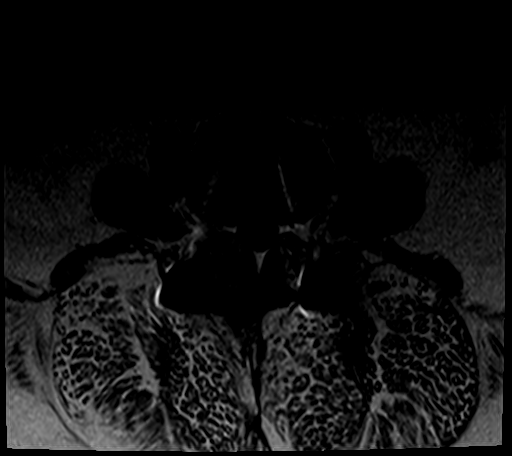
[im 22/33]
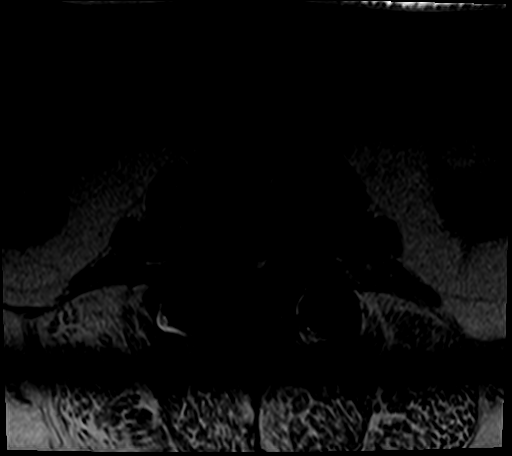
[im 29/33]
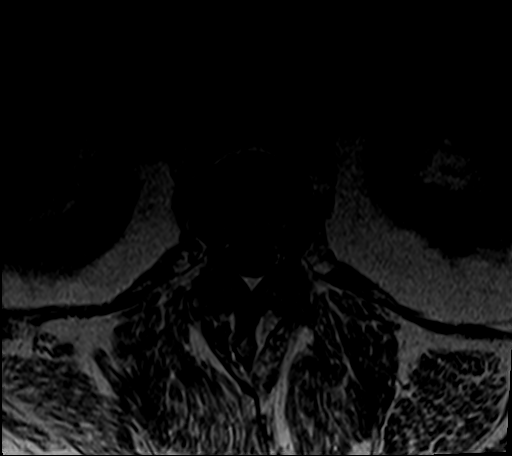
[im 33/33]
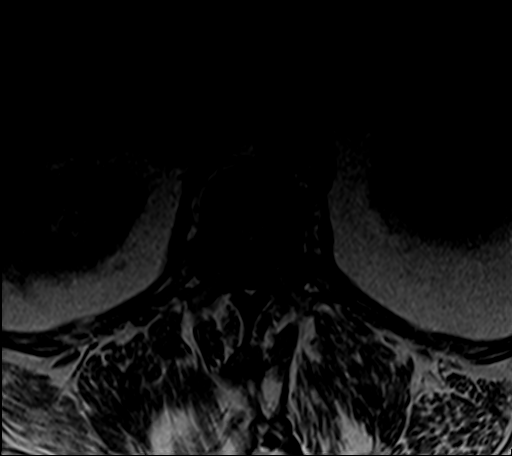

[Series 7: T2 · sagittal · 4.0mm · 0.55mm/px · 4 of 12 slices shown (2 of 2)]
[im 1/12]
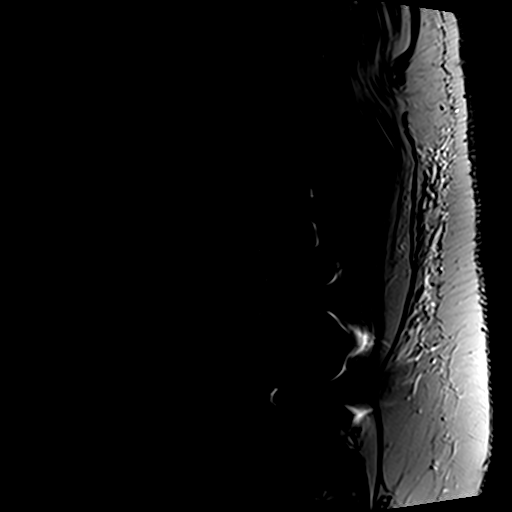
[im 4/12]
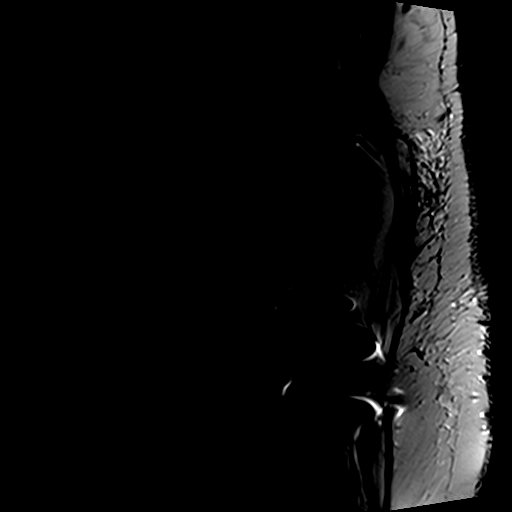
[im 8/12]
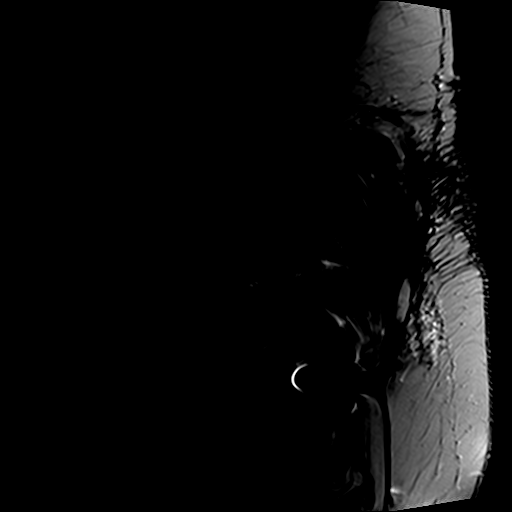
[im 12/12]
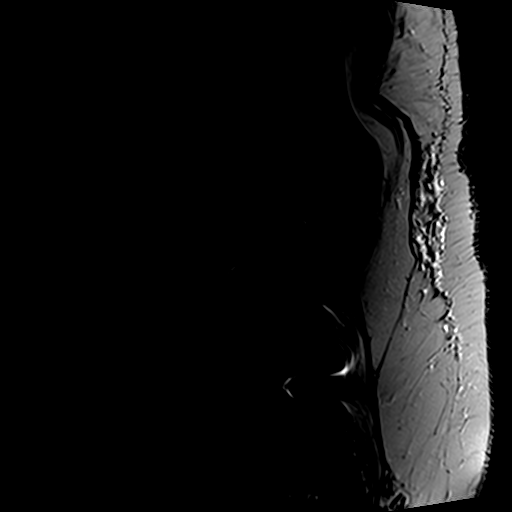

[Series 8: T1 fat-sat post-contrast · sagittal · 4.0mm · 0.55mm/px · 1 of 12 slices shown]
[im 1/12]
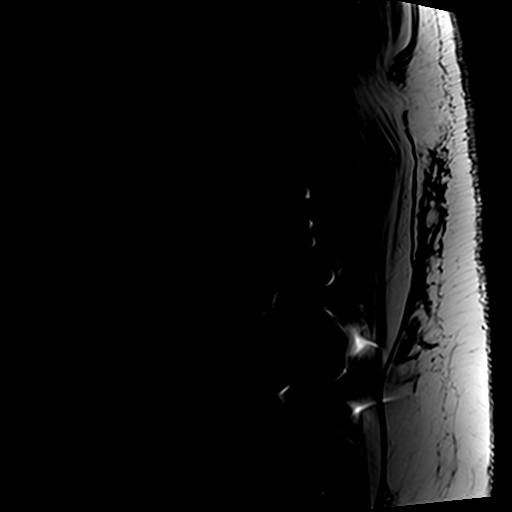

[24 of 48 positions shown; findings below may reference images not displayed]

FINDINGS: Segmentation: Conventional anatomy assumed, with the last open disc
space designated L5-S1.

Alignment: Stable and near anatomic. There is straightening of the
lower lumbar spine status post fusion. There is a minimal
retrolisthesis at L1-2.

Bones: No worrisome osseous lesion, acute fracture or pars defect.
Small hemangioma noted at T12-L1. Status post L2 through S1 fusion.
There are bilateral pedicle screws from L2 through L4. Metallic
interbody spacers present at L3-4 with resulting susceptibility
artifact. There is solid interbody fusion from L4 through S1.
Anterior plates and screws are present at L4-5 and L5-S1. There is
posterior hardware at L5-S1.

Conus medullaris: Extends to the L1 level and appears normal. No
abnormal intradural enhancement demonstrated.

Paraspinal and other soft tissues: No significant paraspinal
findings. There is some fatty atrophy of the erector spinae
musculature.

Disc levels:

There are no significant disc space findings from T10-11 through
T12-L1.

L1-2: Stable mild disc bulging. There is moderate facet and
ligamentous hypertrophy which appears similar to the previous CT.
This does contribute to mild posterior mass effect on the thecal sac
and mild narrowing of the left foramen. The right foramen appears
adequately patent.

L2-3: Posterior fusion. Disc height and hydration are maintained. No
significant spinal stenosis or nerve root encroachment. Underlying
facet arthropathy appears unchanged.

L3-4: Stable appearance status post laminectomy and PLIF. The spinal
canal and neural foramina appear widely patent.

L4-5: Stable solid interbody fusion status post laminectomy and
PLIF. No significant spinal stenosis or foraminal compromise.

L5-S1: Partly obscured by artifact in the posterior elements. No
suspected significant spinal stenosis or nerve root encroachment.
IMPRESSION: 1. Stable postsurgical changes status post L2 through S1 fusion.
Fusion appears solid without significant residual/recurrent spinal
stenosis at the operative levels.
2. Adjacent segment disease at L1-2 with mild disc bulging and
moderate facet and ligamentous hypertrophy contributing to mild
spinal stenosis. There is some foraminal narrowing on the left which
could contribute to left L1 nerve root encroachment.

## 2015-12-30 MED ORDER — DEXAMETHASONE SODIUM PHOSPHATE 10 MG/ML IJ SOLN
10.0000 mg | INTRAMUSCULAR | Status: AC
  Administered 2016-01-02: 10 mg via INTRAVENOUS
  Filled 2015-12-30: qty 1

## 2015-12-30 MED ORDER — SODIUM CHLORIDE 0.9 % IV SOLN
1500.0000 mg | INTRAVENOUS | Status: AC
  Administered 2016-01-02: 1500 mg via INTRAVENOUS
  Filled 2015-12-30: qty 1500

## 2016-01-02 ENCOUNTER — Encounter (HOSPITAL_COMMUNITY)
Admission: RE | Disposition: A | Discharge status: Skilled Nursing Facility | Payer: Self-pay | Source: Ambulatory Visit | Attending: Neurosurgery

## 2016-01-02 ENCOUNTER — Ambulatory Visit (HOSPITAL_COMMUNITY): Payer: Medicare Other | Admitting: Certified Registered"

## 2016-01-02 ENCOUNTER — Encounter (HOSPITAL_COMMUNITY): Payer: Self-pay | Admitting: *Deleted

## 2016-01-02 ENCOUNTER — Inpatient Hospital Stay (HOSPITAL_COMMUNITY)
Admission: RE | Admit: 2016-01-02 | Discharge: 2016-01-06 | DRG: 460 | Disposition: A | Discharge status: Skilled Nursing Facility | Payer: Medicare Other | Source: Ambulatory Visit | Attending: Neurosurgery | Admitting: Neurosurgery

## 2016-01-02 DIAGNOSIS — Z9842 Cataract extraction status, left eye: Secondary | ICD-10-CM

## 2016-01-02 DIAGNOSIS — F329 Major depressive disorder, single episode, unspecified: Secondary | ICD-10-CM | POA: Diagnosis present

## 2016-01-02 DIAGNOSIS — Z79899 Other long term (current) drug therapy: Secondary | ICD-10-CM | POA: Diagnosis not present

## 2016-01-02 DIAGNOSIS — G629 Polyneuropathy, unspecified: Secondary | ICD-10-CM | POA: Diagnosis present

## 2016-01-02 DIAGNOSIS — Y838 Other surgical procedures as the cause of abnormal reaction of the patient, or of later complication, without mention of misadventure at the time of the procedure: Secondary | ICD-10-CM | POA: Diagnosis present

## 2016-01-02 DIAGNOSIS — H409 Unspecified glaucoma: Secondary | ICD-10-CM | POA: Diagnosis present

## 2016-01-02 DIAGNOSIS — F419 Anxiety disorder, unspecified: Secondary | ICD-10-CM | POA: Diagnosis present

## 2016-01-02 DIAGNOSIS — T84226A Displacement of internal fixation device of vertebrae, initial encounter: Secondary | ICD-10-CM | POA: Diagnosis present

## 2016-01-02 DIAGNOSIS — I739 Peripheral vascular disease, unspecified: Secondary | ICD-10-CM | POA: Diagnosis present

## 2016-01-02 DIAGNOSIS — Z9841 Cataract extraction status, right eye: Secondary | ICD-10-CM

## 2016-01-02 DIAGNOSIS — R262 Difficulty in walking, not elsewhere classified: Secondary | ICD-10-CM | POA: Diagnosis not present

## 2016-01-02 DIAGNOSIS — M455 Ankylosing spondylitis of thoracolumbar region: Secondary | ICD-10-CM | POA: Diagnosis not present

## 2016-01-02 DIAGNOSIS — M792 Neuralgia and neuritis, unspecified: Secondary | ICD-10-CM | POA: Diagnosis not present

## 2016-01-02 DIAGNOSIS — Z4789 Encounter for other orthopedic aftercare: Secondary | ICD-10-CM | POA: Diagnosis not present

## 2016-01-02 DIAGNOSIS — K219 Gastro-esophageal reflux disease without esophagitis: Secondary | ICD-10-CM | POA: Diagnosis present

## 2016-01-02 DIAGNOSIS — Z8614 Personal history of Methicillin resistant Staphylococcus aureus infection: Secondary | ICD-10-CM | POA: Diagnosis not present

## 2016-01-02 DIAGNOSIS — R509 Fever, unspecified: Secondary | ICD-10-CM | POA: Diagnosis not present

## 2016-01-02 DIAGNOSIS — Y793 Surgical instruments, materials and orthopedic devices (including sutures) associated with adverse incidents: Secondary | ICD-10-CM | POA: Diagnosis present

## 2016-01-02 DIAGNOSIS — M6281 Muscle weakness (generalized): Secondary | ICD-10-CM | POA: Diagnosis not present

## 2016-01-02 DIAGNOSIS — T8484XA Pain due to internal orthopedic prosthetic devices, implants and grafts, initial encounter: Secondary | ICD-10-CM | POA: Diagnosis not present

## 2016-01-02 DIAGNOSIS — Z87891 Personal history of nicotine dependence: Secondary | ICD-10-CM

## 2016-01-02 DIAGNOSIS — M797 Fibromyalgia: Secondary | ICD-10-CM | POA: Diagnosis present

## 2016-01-02 DIAGNOSIS — M960 Pseudarthrosis after fusion or arthrodesis: Secondary | ICD-10-CM | POA: Diagnosis present

## 2016-01-02 DIAGNOSIS — Z961 Presence of intraocular lens: Secondary | ICD-10-CM | POA: Diagnosis present

## 2016-01-02 DIAGNOSIS — I10 Essential (primary) hypertension: Secondary | ICD-10-CM | POA: Diagnosis present

## 2016-01-02 DIAGNOSIS — R278 Other lack of coordination: Secondary | ICD-10-CM | POA: Diagnosis not present

## 2016-01-02 DIAGNOSIS — G4733 Obstructive sleep apnea (adult) (pediatric): Secondary | ICD-10-CM | POA: Diagnosis present

## 2016-01-02 DIAGNOSIS — T84296A Other mechanical complication of internal fixation device of vertebrae, initial encounter: Secondary | ICD-10-CM | POA: Diagnosis not present

## 2016-01-02 DIAGNOSIS — M1990 Unspecified osteoarthritis, unspecified site: Secondary | ICD-10-CM | POA: Diagnosis not present

## 2016-01-02 DIAGNOSIS — M549 Dorsalgia, unspecified: Secondary | ICD-10-CM | POA: Diagnosis not present

## 2016-01-02 DIAGNOSIS — M545 Low back pain: Secondary | ICD-10-CM | POA: Diagnosis not present

## 2016-01-02 DIAGNOSIS — S32009K Unspecified fracture of unspecified lumbar vertebra, subsequent encounter for fracture with nonunion: Secondary | ICD-10-CM | POA: Diagnosis present

## 2016-01-02 HISTORY — PX: HARDWARE REMOVAL: SHX979

## 2016-01-02 SURGERY — REMOVAL, HARDWARE
Anesthesia: General | Site: Spine Lumbar

## 2016-01-02 MED ORDER — HYDROMORPHONE HCL 1 MG/ML IJ SOLN
0.2500 mg | INTRAMUSCULAR | Status: DC | PRN
Start: 2016-01-02 — End: 2016-01-02
  Administered 2016-01-02 (×2): 0.25 mg via INTRAVENOUS

## 2016-01-02 MED ORDER — DIPHENHYDRAMINE HCL 25 MG PO CAPS: 25.0000 mg | ORAL_CAPSULE | Freq: Two times a day (BID) | ORAL | Status: DC | PRN

## 2016-01-02 MED ORDER — VANCOMYCIN HCL 1000 MG IV SOLR
INTRAVENOUS | Status: DC | PRN
  Administered 2016-01-02: 1000 mg via TOPICAL

## 2016-01-02 MED ORDER — ESTRADIOL 0.1 MG/GM VA CREA
1.0000 | TOPICAL_CREAM | VAGINAL | Status: DC
  Administered 2016-01-02 – 2016-01-05 (×2): 1 via VAGINAL
  Filled 2016-01-02: qty 42.5

## 2016-01-02 MED ORDER — OXYCODONE HCL 5 MG/5ML PO SOLN: 5.0000 mg | Freq: Once | ORAL | Status: DC | PRN

## 2016-01-02 MED ORDER — LOSARTAN POTASSIUM-HCTZ 100-25 MG PO TABS: 1.0000 | ORAL_TABLET | Freq: Every morning | ORAL | Status: DC

## 2016-01-02 MED ORDER — HYDROMORPHONE HCL 1 MG/ML IJ SOLN
INTRAMUSCULAR | Status: AC
  Filled 2016-01-02: qty 1

## 2016-01-02 MED ORDER — THROMBIN 5000 UNITS EX SOLR
CUTANEOUS | Status: DC | PRN
  Administered 2016-01-02 (×2): 5000 [IU] via TOPICAL

## 2016-01-02 MED ORDER — B COMPLEX-C PO TABS
1.0000 | ORAL_TABLET | Freq: Every day | ORAL | Status: DC
  Administered 2016-01-03 – 2016-01-06 (×4): 1 via ORAL
  Filled 2016-01-02 (×4): qty 1

## 2016-01-02 MED ORDER — SUCCINYLCHOLINE CHLORIDE 20 MG/ML IJ SOLN
INTRAMUSCULAR | Status: AC
  Filled 2016-01-02: qty 1

## 2016-01-02 MED ORDER — HYDRALAZINE HCL 25 MG PO TABS
25.0000 mg | ORAL_TABLET | Freq: Three times a day (TID) | ORAL | Status: DC
  Administered 2016-01-02 – 2016-01-06 (×12): 25 mg via ORAL
  Filled 2016-01-02 (×12): qty 1

## 2016-01-02 MED ORDER — ARTIFICIAL TEARS OP OINT
TOPICAL_OINTMENT | OPHTHALMIC | Status: AC
  Filled 2016-01-02: qty 3.5

## 2016-01-02 MED ORDER — GLYCOPYRROLATE 1 MG PO TABS
1.0000 mg | ORAL_TABLET | Freq: Two times a day (BID) | ORAL | Status: DC
  Administered 2016-01-02 – 2016-01-06 (×8): 1 mg via ORAL
  Filled 2016-01-02 (×10): qty 1

## 2016-01-02 MED ORDER — ACETAMINOPHEN 650 MG RE SUPP: 650.0000 mg | RECTAL | Status: DC | PRN

## 2016-01-02 MED ORDER — LOSARTAN POTASSIUM 50 MG PO TABS
100.0000 mg | ORAL_TABLET | Freq: Every day | ORAL | Status: DC
  Administered 2016-01-02 – 2016-01-06 (×5): 100 mg via ORAL
  Filled 2016-01-02 (×5): qty 2

## 2016-01-02 MED ORDER — OXYCODONE-ACETAMINOPHEN 5-325 MG PO TABS
1.0000 | ORAL_TABLET | ORAL | Status: DC | PRN
  Administered 2016-01-02 – 2016-01-05 (×7): 2 via ORAL
  Administered 2016-01-06: 1 via ORAL
  Administered 2016-01-06: 2 via ORAL
  Filled 2016-01-02 (×8): qty 2
  Filled 2016-01-02: qty 1

## 2016-01-02 MED ORDER — BUPIVACAINE LIPOSOME 1.3 % IJ SUSP
INTRAMUSCULAR | Status: DC | PRN
  Administered 2016-01-02: 20 mL

## 2016-01-02 MED ORDER — MIDAZOLAM HCL 2 MG/2ML IJ SOLN
INTRAMUSCULAR | Status: AC
  Filled 2016-01-02: qty 2

## 2016-01-02 MED ORDER — ONDANSETRON HCL 4 MG/2ML IJ SOLN
INTRAMUSCULAR | Status: DC | PRN
  Administered 2016-01-02: 4 mg via INTRAVENOUS

## 2016-01-02 MED ORDER — ONDANSETRON HCL 4 MG/2ML IJ SOLN
INTRAMUSCULAR | Status: AC
  Filled 2016-01-02: qty 2

## 2016-01-02 MED ORDER — CYCLOBENZAPRINE HCL 10 MG PO TABS: 10.0000 mg | ORAL_TABLET | Freq: Two times a day (BID) | ORAL | Status: DC | PRN

## 2016-01-02 MED ORDER — SUGAMMADEX SODIUM 500 MG/5ML IV SOLN
INTRAVENOUS | Status: AC
  Filled 2016-01-02: qty 5

## 2016-01-02 MED ORDER — BUPIVACAINE LIPOSOME 1.3 % IJ SUSP
20.0000 mL | Freq: Once | INTRAMUSCULAR | Status: DC
  Filled 2016-01-02: qty 20

## 2016-01-02 MED ORDER — PANTOPRAZOLE SODIUM 40 MG PO TBEC
40.0000 mg | DELAYED_RELEASE_TABLET | Freq: Every day | ORAL | Status: DC
  Administered 2016-01-03 – 2016-01-06 (×4): 40 mg via ORAL
  Filled 2016-01-02 (×5): qty 1

## 2016-01-02 MED ORDER — OXYCODONE HCL 5 MG PO TABS: 10.0000 mg | ORAL_TABLET | Freq: Once | ORAL | Status: DC | PRN

## 2016-01-02 MED ORDER — SUGAMMADEX SODIUM 500 MG/5ML IV SOLN
INTRAVENOUS | Status: DC | PRN
  Administered 2016-01-02: 400 mg via INTRAVENOUS

## 2016-01-02 MED ORDER — ROCURONIUM BROMIDE 50 MG/5ML IV SOLN
INTRAVENOUS | Status: AC
  Filled 2016-01-02: qty 1

## 2016-01-02 MED ORDER — GABAPENTIN 300 MG PO CAPS
300.0000 mg | ORAL_CAPSULE | Freq: Every day | ORAL | Status: DC
  Administered 2016-01-02 – 2016-01-05 (×4): 300 mg via ORAL
  Filled 2016-01-02 (×5): qty 1

## 2016-01-02 MED ORDER — ADULT MULTIVITAMIN W/MINERALS CH
1.0000 | ORAL_TABLET | Freq: Every day | ORAL | Status: DC
  Administered 2016-01-03 – 2016-01-06 (×4): 1 via ORAL
  Filled 2016-01-02 (×4): qty 1

## 2016-01-02 MED ORDER — VITAMIN E 180 MG (400 UNIT) PO CAPS
400.0000 [IU] | ORAL_CAPSULE | Freq: Every day | ORAL | Status: DC
  Administered 2016-01-03 – 2016-01-06 (×4): 400 [IU] via ORAL
  Filled 2016-01-02 (×4): qty 1

## 2016-01-02 MED ORDER — CALCIUM CITRATE-VITAMIN D 500-400 MG-UNIT PO CHEW
1.0000 | CHEWABLE_TABLET | Freq: Every day | ORAL | Status: DC
  Filled 2016-01-02: qty 1

## 2016-01-02 MED ORDER — PHENOL 1.4 % MT LIQD: 1.0000 | OROMUCOSAL | Status: DC | PRN

## 2016-01-02 MED ORDER — CYCLOBENZAPRINE HCL 10 MG PO TABS
10.0000 mg | ORAL_TABLET | Freq: Three times a day (TID) | ORAL | Status: DC | PRN
  Administered 2016-01-02 – 2016-01-05 (×8): 10 mg via ORAL
  Filled 2016-01-02 (×7): qty 1

## 2016-01-02 MED ORDER — SODIUM CHLORIDE 0.9% FLUSH: 3.0000 mL | INTRAVENOUS | Status: DC | PRN

## 2016-01-02 MED ORDER — OXYCODONE-ACETAMINOPHEN 5-325 MG PO TABS: 1.0000 | ORAL_TABLET | ORAL | Status: DC | PRN

## 2016-01-02 MED ORDER — LIDOCAINE HCL (CARDIAC) 20 MG/ML IV SOLN
INTRAVENOUS | Status: AC
  Filled 2016-01-02: qty 5

## 2016-01-02 MED ORDER — PROPOFOL 10 MG/ML IV BOLUS
INTRAVENOUS | Status: DC | PRN
  Administered 2016-01-02: 120 mg via INTRAVENOUS

## 2016-01-02 MED ORDER — SODIUM CHLORIDE 0.9 % IR SOLN
Status: DC | PRN
  Administered 2016-01-02: 500 mL

## 2016-01-02 MED ORDER — ROCURONIUM BROMIDE 100 MG/10ML IV SOLN
INTRAVENOUS | Status: DC | PRN
  Administered 2016-01-02: 40 mg via INTRAVENOUS
  Administered 2016-01-02: 10 mg via INTRAVENOUS

## 2016-01-02 MED ORDER — CEFAZOLIN SODIUM 1-5 GM-% IV SOLN
1.0000 g | Freq: Three times a day (TID) | INTRAVENOUS | Status: AC
  Administered 2016-01-02 – 2016-01-03 (×2): 1 g via INTRAVENOUS
  Filled 2016-01-02 (×3): qty 50

## 2016-01-02 MED ORDER — GLYCOPYRROLATE 0.2 MG/ML IJ SOLN
INTRAMUSCULAR | Status: AC
  Filled 2016-01-02: qty 4

## 2016-01-02 MED ORDER — LIDOCAINE HCL (CARDIAC) 20 MG/ML IV SOLN
INTRAVENOUS | Status: DC | PRN
  Administered 2016-01-02: 100 mg via INTRAVENOUS

## 2016-01-02 MED ORDER — CLONIDINE HCL 0.1 MG PO TABS
0.1000 mg | ORAL_TABLET | Freq: Every day | ORAL | Status: DC
  Administered 2016-01-03 – 2016-01-06 (×4): 0.1 mg via ORAL
  Filled 2016-01-02 (×4): qty 1

## 2016-01-02 MED ORDER — OXYCODONE-ACETAMINOPHEN 10-325 MG PO TABS: 0.5000 | ORAL_TABLET | Freq: Four times a day (QID) | ORAL | Status: DC | PRN

## 2016-01-02 MED ORDER — NEOSTIGMINE METHYLSULFATE 10 MG/10ML IV SOLN
INTRAVENOUS | Status: AC
  Filled 2016-01-02: qty 1

## 2016-01-02 MED ORDER — PROPOFOL 10 MG/ML IV BOLUS
INTRAVENOUS | Status: AC
  Filled 2016-01-02: qty 20

## 2016-01-02 MED ORDER — TAMSULOSIN HCL 0.4 MG PO CAPS
0.4000 mg | ORAL_CAPSULE | Freq: Every day | ORAL | Status: DC
  Administered 2016-01-02 – 2016-01-06 (×5): 0.4 mg via ORAL
  Filled 2016-01-02 (×5): qty 1

## 2016-01-02 MED ORDER — POTASSIUM CHLORIDE CRYS ER 20 MEQ PO TBCR
20.0000 meq | EXTENDED_RELEASE_TABLET | Freq: Every day | ORAL | Status: DC
  Administered 2016-01-03 – 2016-01-06 (×4): 20 meq via ORAL
  Filled 2016-01-02 (×4): qty 1

## 2016-01-02 MED ORDER — LACTATED RINGERS IV SOLN
INTRAVENOUS | Status: DC
  Administered 2016-01-02 – 2016-01-03 (×4): via INTRAVENOUS

## 2016-01-02 MED ORDER — SODIUM CHLORIDE 0.9% FLUSH
3.0000 mL | Freq: Two times a day (BID) | INTRAVENOUS | Status: DC
  Administered 2016-01-03 – 2016-01-06 (×6): 3 mL via INTRAVENOUS

## 2016-01-02 MED ORDER — AZELAIC ACID 20 % EX CREA
1.0000 "application " | TOPICAL_CREAM | Freq: Two times a day (BID) | CUTANEOUS | Status: DC
  Administered 2016-01-05 (×2): 1 via TOPICAL

## 2016-01-02 MED ORDER — SUCCINYLCHOLINE CHLORIDE 20 MG/ML IJ SOLN
INTRAMUSCULAR | Status: DC | PRN
  Administered 2016-01-02: 100 mg via INTRAVENOUS

## 2016-01-02 MED ORDER — VANCOMYCIN HCL 1000 MG IV SOLR
INTRAVENOUS | Status: AC
  Filled 2016-01-02: qty 1000

## 2016-01-02 MED ORDER — VITAMIN B-12 1000 MCG PO TABS
1000.0000 ug | ORAL_TABLET | Freq: Every day | ORAL | Status: DC
  Administered 2016-01-02 – 2016-01-05 (×4): 1000 ug via ORAL
  Filled 2016-01-02 (×4): qty 1

## 2016-01-02 MED ORDER — SODIUM CHLORIDE 0.9 % IV SOLN: 250.0000 mL | INTRAVENOUS | Status: DC

## 2016-01-02 MED ORDER — CALCIUM-VITAMIN D3 500-400 MG-UNIT PO TABS: ORAL_TABLET | Freq: Every day | ORAL | Status: DC

## 2016-01-02 MED ORDER — ARTIFICIAL TEARS OP OINT
TOPICAL_OINTMENT | OPHTHALMIC | Status: DC | PRN
  Administered 2016-01-02: 1 via OPHTHALMIC

## 2016-01-02 MED ORDER — PAROXETINE HCL 20 MG PO TABS
40.0000 mg | ORAL_TABLET | Freq: Every day | ORAL | Status: DC
  Administered 2016-01-02 – 2016-01-06 (×5): 40 mg via ORAL
  Filled 2016-01-02 (×5): qty 2

## 2016-01-02 MED ORDER — ONDANSETRON HCL 4 MG/2ML IJ SOLN: 4.0000 mg | INTRAMUSCULAR | Status: DC | PRN

## 2016-01-02 MED ORDER — L-METHYLFOLATE-B6-B12 3-35-2 MG PO TABS
1.0000 | ORAL_TABLET | Freq: Two times a day (BID) | ORAL | Status: DC
  Administered 2016-01-03 – 2016-01-06 (×7): 1 via ORAL
  Filled 2016-01-02 (×8): qty 1

## 2016-01-02 MED ORDER — LIDOCAINE-EPINEPHRINE 1 %-1:100000 IJ SOLN
INTRAMUSCULAR | Status: DC | PRN
  Administered 2016-01-02: 10 mL

## 2016-01-02 MED ORDER — FENTANYL CITRATE (PF) 250 MCG/5ML IJ SOLN
INTRAMUSCULAR | Status: AC
  Filled 2016-01-02: qty 5

## 2016-01-02 MED ORDER — MENTHOL 3 MG MT LOZG: 1.0000 | LOZENGE | OROMUCOSAL | Status: DC | PRN

## 2016-01-02 MED ORDER — HYDROMORPHONE HCL 1 MG/ML IJ SOLN
0.5000 mg | INTRAMUSCULAR | Status: DC | PRN
  Administered 2016-01-02 – 2016-01-04 (×7): 1 mg via INTRAVENOUS
  Administered 2016-01-05: 0.5 mg via INTRAVENOUS
  Filled 2016-01-02 (×10): qty 1

## 2016-01-02 MED ORDER — ACETAMINOPHEN 325 MG PO TABS
650.0000 mg | ORAL_TABLET | ORAL | Status: DC | PRN
  Administered 2016-01-05: 650 mg via ORAL
  Filled 2016-01-02: qty 2

## 2016-01-02 MED ORDER — ONDANSETRON HCL 4 MG/2ML IJ SOLN: 4.0000 mg | Freq: Once | INTRAMUSCULAR | Status: DC | PRN

## 2016-01-02 MED ORDER — FENTANYL CITRATE (PF) 100 MCG/2ML IJ SOLN
INTRAMUSCULAR | Status: DC | PRN
  Administered 2016-01-02 (×4): 50 ug via INTRAVENOUS

## 2016-01-02 MED ORDER — 0.9 % SODIUM CHLORIDE (POUR BTL) OPTIME
TOPICAL | Status: DC | PRN
  Administered 2016-01-02: 1000 mL

## 2016-01-02 MED ORDER — HYDROCHLOROTHIAZIDE 25 MG PO TABS
25.0000 mg | ORAL_TABLET | Freq: Every day | ORAL | Status: DC
  Administered 2016-01-02 – 2016-01-06 (×5): 25 mg via ORAL
  Filled 2016-01-02 (×5): qty 1

## 2016-01-02 MED ORDER — HEMOSTATIC AGENTS (NO CHARGE) OPTIME
TOPICAL | Status: DC | PRN
  Administered 2016-01-02: 1 via TOPICAL

## 2016-01-02 MED ORDER — VITAMIN B COMPLEX PO TABS: ORAL_TABLET | Freq: Every day | ORAL | Status: DC

## 2016-01-02 SURGICAL SUPPLY — 65 items
APL SKNCLS STERI-STRIP NONHPOA (GAUZE/BANDAGES/DRESSINGS) ×1
BAG DECANTER FOR FLEXI CONT (MISCELLANEOUS) ×2 IMPLANT
BENZOIN TINCTURE PRP APPL 2/3 (GAUZE/BANDAGES/DRESSINGS) ×2 IMPLANT
BLADE CLIPPER SURG (BLADE) IMPLANT
BRUSH SCRUB EZ PLAIN DRY (MISCELLANEOUS) ×2 IMPLANT
BUPIVACAINE 0.25% IMPLANT
BUR MATCHSTICK NEURO 3.0 LAGG (BURR) ×2 IMPLANT
BUR PRECISION FLUTE 6.0 (BURR) ×1 IMPLANT
CANISTER SUCT 3000ML PPV (MISCELLANEOUS) ×1 IMPLANT
CAP LOCKING THREADED (Cap) ×2 IMPLANT
DECANTER SPIKE VIAL GLASS SM (MISCELLANEOUS) ×2 IMPLANT
DRAPE LAPAROTOMY 100X72X124 (DRAPES) ×2 IMPLANT
DRAPE POUCH INSTRU U-SHP 10X18 (DRAPES) ×2 IMPLANT
DRAPE PROXIMA HALF (DRAPES) IMPLANT
DRAPE SURG 17X23 STRL (DRAPES) ×2 IMPLANT
DRSG OPSITE 4X5.5 SM (GAUZE/BANDAGES/DRESSINGS) ×1 IMPLANT
DRSG OPSITE POSTOP 4X8 (GAUZE/BANDAGES/DRESSINGS) ×1 IMPLANT
ELECT BLADE 4.0 EZ CLEAN MEGAD (MISCELLANEOUS) ×2
ELECT REM PT RETURN 9FT ADLT (ELECTROSURGICAL) ×2
ELECTRODE BLDE 4.0 EZ CLN MEGD (MISCELLANEOUS) IMPLANT
ELECTRODE REM PT RTRN 9FT ADLT (ELECTROSURGICAL) ×1 IMPLANT
EVACUATOR 1/8 PVC DRAIN (DRAIN) ×1 IMPLANT
GAUZE SPONGE 4X4 12PLY STRL (GAUZE/BANDAGES/DRESSINGS) ×1 IMPLANT
GAUZE SPONGE 4X4 16PLY XRAY LF (GAUZE/BANDAGES/DRESSINGS) IMPLANT
GLOVE BIO SURGEON STRL SZ8 (GLOVE) ×1 IMPLANT
GLOVE BIOGEL PI IND STRL 7.0 (GLOVE) IMPLANT
GLOVE BIOGEL PI IND STRL 7.5 (GLOVE) IMPLANT
GLOVE BIOGEL PI INDICATOR 7.0 (GLOVE) ×5
GLOVE BIOGEL PI INDICATOR 7.5 (GLOVE) ×2
GLOVE EXAM NITRILE LRG STRL (GLOVE) IMPLANT
GLOVE EXAM NITRILE MD LF STRL (GLOVE) IMPLANT
GLOVE EXAM NITRILE XL STR (GLOVE) IMPLANT
GLOVE EXAM NITRILE XS STR PU (GLOVE) IMPLANT
GLOVE INDICATOR 8.5 STRL (GLOVE) ×1 IMPLANT
GLOVE SURG SS PI 8.0 STRL IVOR (GLOVE) ×1 IMPLANT
GLOVE SURG SS PI 8.5 STRL IVOR (GLOVE) ×1
GLOVE SURG SS PI 8.5 STRL STRW (GLOVE) IMPLANT
GOWN STRL REUS W/ TWL LRG LVL3 (GOWN DISPOSABLE) ×1 IMPLANT
GOWN STRL REUS W/ TWL XL LVL3 (GOWN DISPOSABLE) ×2 IMPLANT
GOWN STRL REUS W/TWL 2XL LVL3 (GOWN DISPOSABLE) ×1 IMPLANT
GOWN STRL REUS W/TWL LRG LVL3 (GOWN DISPOSABLE) ×4
GOWN STRL REUS W/TWL XL LVL3 (GOWN DISPOSABLE) ×2
KIT BASIN OR (CUSTOM PROCEDURE TRAY) ×2 IMPLANT
KIT INFUSE SMALL (Orthopedic Implant) ×1 IMPLANT
KIT ROOM TURNOVER OR (KITS) ×2 IMPLANT
LIQUID BAND (GAUZE/BANDAGES/DRESSINGS) ×2 IMPLANT
NDL HYPO 21X1.5 SAFETY (NEEDLE) IMPLANT
NDL SPNL 22GX3.5 QUINCKE BK (NEEDLE) ×1 IMPLANT
NEEDLE HYPO 21X1.5 SAFETY (NEEDLE) ×2 IMPLANT
NEEDLE HYPO 22GX1.5 SAFETY (NEEDLE) ×2 IMPLANT
NEEDLE SPNL 22GX3.5 QUINCKE BK (NEEDLE) IMPLANT
NS IRRIG 1000ML POUR BTL (IV SOLUTION) ×2 IMPLANT
PACK LAMINECTOMY NEURO (CUSTOM PROCEDURE TRAY) ×2 IMPLANT
ROD CREO 45MM SPINAL (Rod) ×1 IMPLANT
SPONGE SURGIFOAM ABS GEL SZ50 (HEMOSTASIS) ×2 IMPLANT
STRIP CLOSURE SKIN 1/2X4 (GAUZE/BANDAGES/DRESSINGS) ×4 IMPLANT
STRIP VITOSS 25X100X4MM (Neuro Prosthesis/Implant) ×2 IMPLANT
SUT VIC AB 0 CT1 18XCR BRD8 (SUTURE) ×1 IMPLANT
SUT VIC AB 0 CT1 8-18 (SUTURE) ×2
SUT VIC AB 2-0 CT1 18 (SUTURE) ×2 IMPLANT
SUT VICRYL 4-0 PS2 18IN ABS (SUTURE) ×2 IMPLANT
SYR 20CC LL (SYRINGE) ×1 IMPLANT
TOWEL OR 17X24 6PK STRL BLUE (TOWEL DISPOSABLE) ×1 IMPLANT
TOWEL OR 17X26 10 PK STRL BLUE (TOWEL DISPOSABLE) ×2 IMPLANT
WATER STERILE IRR 1000ML POUR (IV SOLUTION) ×2 IMPLANT

## 2016-01-02 NOTE — Anesthesia Procedure Notes (Signed)
Procedure Name: Intubation Date/Time: 01/02/2016 11:16 AM Performed by: Gaylene Brooks Pre-anesthesia Checklist: Patient identified, Timeout performed, Emergency Drugs available, Suction available and Patient being monitored Patient Re-evaluated:Patient Re-evaluated prior to inductionOxygen Delivery Method: Circle system utilized Preoxygenation: Pre-oxygenation with 100% oxygen Intubation Type: IV induction Ventilation: Mask ventilation without difficulty Laryngoscope Size: Glidescope and 4 Grade View: Grade I Tube type: Oral Tube size: 7.0 mm Number of attempts: 1 Airway Equipment and Method: Video-laryngoscopy Placement Confirmation: ETT inserted through vocal cords under direct vision,  positive ETCO2 and breath sounds checked- equal and bilateral Secured at: 21 cm Tube secured with: Tape Dental Injury: Teeth and Oropharynx as per pre-operative assessment

## 2016-01-02 NOTE — H&P (Signed)
Amy [name] is an 70 y.o. female.   Chief Complaint: Back pain HPI: Amy [name] is a very pleasant 70 year old female who underwent L2-L1 extension of her fusion and has had progressive worsening right-sided back pain workup with MRI scan has shown well decompressed spinal canal with no adjacent disc disease but CT scans of shown lucency around her right L1 screw. Screws on the left appeared to be good no lucency appears to be on the to 3 level that she does appear to be solidly fused at L2-3 and 3 for however L1-2 does not appear to be solidly fused yet. Due to lucency around the right L1 screw right-sided back pain and the evidence of not yet solid fusion L1-2 I think this could represent impending pseudoarthrosis versus just some toggling around a loose right L1 screw because the other screws are solid I recommended doing a hardware removal only with removal of the right L1 screw and a posterior lateral fusion on the right I've extensively gone over the risks and benefits of this procedure with the patient as well as perioperative course expectations of outcome and alternatives of surgery and she understands and agrees to proceed forward.  Past Medical History  Diagnosis Date  . Anxiety   . Dysphonia     intermittent "voice changes"  . Chronic back pain     lumbar steroid injection's  . Seasonal allergies   . Fibromyalgia   . Weakness     both hands and feet  . Urinary frequency   . Glaucoma   . History of MRSA infection 2011  . History of DVT (deep vein thrombosis) 1989    LEFT UPPER ARM  . Hypertension   . Short of breath on exertion   . Depression   . GERD (gastroesophageal reflux disease)   . Pelvic pain in female   . Arthritis     ddd- RA  . Irregular heart rate     "years ago"  . Peripheral vascular disease ([place])     "poor circulation"  . Mild obstructive sleep apnea     per study 02-07-2006 - no cpap  . Asthma     "sleeping asthma"  . History of hiatal  hernia   . Headache   . Neuropathy (HCC)     feet  . Low iron   . Constipation     Past Surgical History  Procedure Laterality Date  . Abdominal hysterectomy  1978  . Nose surgery  2007  . Mass excision Left 02/20/2013    Procedure: EXCISION LEFT BUTTOCK  MASS;  Surgeon: Adin Hector, MD;  Location: [facility];  Service: General;  Laterality: Left;  . Esophagogastroduodenoscopy N/A 07/15/2013    Procedure: ESOPHAGOGASTRODUODENOSCOPY (EGD);  Surgeon: Winfield Cunas., MD;  Location: [facility];  Service: Endoscopy;  Laterality: N/A;  need xray  . Abdominal adhesions removed    . Colonoscopy    . Bunionectomy Left 2008  . Shoulder open rotator cuff repair Left 05/05/2014    Procedure: OPEN ACROMIONECTOMY AND OPEN REPAIR OF ROTATOR CUFF, TISSUEMEND GRAFT WITH ANCHOR ;  Surgeon: Tobi Bastos, MD;  Location: [facility];  Service: Orthopedics;  Laterality: Left;  . Esophageal manometry N/A 11/22/2014    Procedure: ESOPHAGEAL MANOMETRY (EM);  Surgeon: Winfield Cunas., MD;  Location: [facility];  Service: Endoscopy;  Laterality: N/A;  . Lumbar fusion  2014    L4 -- L5  . Removal hardware l4-l5/  bilateral laminectomy l2 -  l5 and fusion  06-12-2011  . Laparoscopic cholecystectomy  01-11-2006  . Cysto with hydrodistension N/A 07/12/2015    Procedure: CYSTOSCOPY/HYDRODISTENSION;  Surgeon: Bjorn Loser, MD;  Location: [facility];  Service: Urology;  Laterality: N/A;  . Eye surgery Bilateral     cataract surgery with lens implants  . Tonsillectomy      Family History  Problem Relation Age of Onset  . Hypertension Mother   . Cancer Mother     cervix  . Kidney disease Mother   . Hypertension Sister   . Cancer Sister     polyps  . Kidney disease Brother   . Cancer Daughter     leukemia  . Hypertension Brother    Social History:  reports that she quit smoking about 46 years ago. She has never used smokeless tobacco. She reports that she drinks alcohol. She  reports that she does not use illicit drugs.  Allergies:  Allergies  Allergen Reactions  . Latex Other (See Comments)    Sores, blisters  . Penicillins Rash    UNKNOWN but able to take AMPICILLIN     Medications Prior to Admission  Medication Sig Dispense Refill  . azelaic acid (AZELEX) 20 % cream Apply 1 application topically 2 (two) times daily. After skin is thoroughly washed and patted dry, gently but thoroughly massage a thin film of azelaic acid cream into the affected area twice daily, in the morning and evening.    . B Complex Vitamins (VITAMIN B COMPLEX PO) Take 1 tablet by mouth daily.    . Calcium Carbonate-Vitamin D (CALCIUM-VITAMIN D3 PO) Take 1 tablet by mouth daily.    . cloNIDine (CATAPRES) 0.1 MG tablet Take 0.1 mg by mouth every 12 (twelve) hours as needed. For Systolic > [id number]    . diphenhydrAMINE (BENADRYL) 25 mg capsule Take 25 mg by mouth 2 (two) times daily as needed for allergies.    Marland Kitchen ESTRACE VAGINAL 0.1 MG/GM vaginal cream Place 1 application vaginally 2 (two) times a week.   5  . gabapentin (NEURONTIN) 300 MG capsule Take 300 mg by mouth at bedtime.     . hydrALAZINE (APRESOLINE) 25 MG tablet Take 25 mg by mouth 3 (three) times daily.    Marland Kitchen L-Methylfolate-B6-B12 (METANX PO) Take 1 tablet by mouth 2 (two) times daily.    Marland Kitchen losartan-hydrochlorothiazide (HYZAAR) 100-25 MG per tablet Take 1 tablet by mouth every morning.     . Multiple Vitamin (MULTIVITAMIN WITH MINERALS) TABS Take 1 tablet by mouth daily.    Marland Kitchen oxyCODONE-acetaminophen (PERCOCET) 10-325 MG tablet Take 0.5 tablets by mouth every 6 (six) hours as needed for pain.    . pantoprazole (PROTONIX) 40 MG tablet Take 40 mg by mouth daily.    Marland Kitchen PARoxetine (PAXIL) 40 MG tablet Take 40 mg by mouth daily.  4  . potassium chloride SA (K-DUR,KLOR-CON) 20 MEQ tablet Take 20 mEq by mouth daily.    . silodosin (RAPAFLO) 8 MG CAPS capsule Take 8 mg by mouth daily as needed (for bladder).     . vitamin B-12  (CYANOCOBALAMIN) 1000 MCG tablet Take 1,000 mcg by mouth at bedtime.    . vitamin E (VITAMIN E) 400 UNIT capsule Take 400 Units by mouth daily.    . cyclobenzaprine (FLEXERIL) 10 MG tablet Take 1 tablet (10 mg total) by mouth 2 (two) times daily as needed for muscle spasms. 30 tablet 0  . glycopyrrolate (ROBINUL) 2 MG tablet Take 1 mg by mouth  2 (two) times daily.     Marland Kitchen oxyCODONE-acetaminophen (PERCOCET/ROXICET) 5-325 MG tablet Take 1-2 tablets by mouth every 4 (four) hours as needed for moderate pain. 60 tablet 0    No results found for this or any previous visit (from the past 48 hour(s)). No results found.  Review of Systems  Constitutional: Negative.   HENT: Negative.   Eyes: Negative.   Respiratory: Negative.   Cardiovascular: Negative.   Gastrointestinal: Negative.   Genitourinary: Negative.   Musculoskeletal: Positive for myalgias, back pain and joint pain.  Skin: Negative.   Neurological: Negative.   Psychiatric/Behavioral: Negative.     Blood pressure 140/63, temperature 99.7 F (37.6 C), temperature source Oral, resp. rate 18, SpO2 95 %. Physical Exam  Constitutional: She is oriented to person, place, and time. She appears well-developed and well-nourished.  HENT:  Head: Normocephalic.  Eyes: Pupils are equal, round, and reactive to light.  Neck: Normal range of motion.  Respiratory: Effort normal.  GI: Soft. Bowel sounds are normal.  Neurological: She is alert and oriented to person, place, and time. She has normal strength. GCS eye subscore is 4. GCS verbal subscore is 5. GCS motor subscore is 6.  Strength is 5 out of 5 in her iliopsoas, quads, hip she's, gastric, into tibialis, and EHL.  Skin: Skin is warm and dry.     Assessment/Plan 71 year old female presents for right sided hardware removal and removal right L1 screw.  CRAM,GARY P, MD 01/02/2016, 10:56 AM

## 2016-01-02 NOTE — Anesthesia Preprocedure Evaluation (Addendum)
Anesthesia Evaluation  Patient identified by MRN, date of birth, ID band Patient awake    Reviewed: Allergy & Precautions, NPO status , Patient's Chart, lab work & pertinent test results  History of Anesthesia Complications Negative for: history of anesthetic complications  Airway Mallampati: III  TM Distance: >3 FB Neck ROM: Full    Dental  (+) Chipped, Dental Advisory Given   Pulmonary asthma , sleep apnea (mild-does not ned CPAP) , COPD,  COPD inhaler, former smoker,    breath sounds clear to auscultation       Cardiovascular hypertension, Pt. on medications (-) angina+ Peripheral Vascular Disease and + DVT (remote h/o DVT arm)   Rhythm:Regular Rate:Normal     Neuro/Psych PSYCHIATRIC DISORDERS Anxiety Depression Chronic back pain: narcotics  Neuromuscular disease    GI/Hepatic Neg liver ROS, hiatal hernia, GERD  Medicated and Controlled,  Endo/Other  Morbid obesity  Renal/GU negative Renal ROS     Musculoskeletal  (+) Arthritis , Fibromyalgia -  Abdominal (+) + obese,   Peds  Hematology   Anesthesia Other Findings   Reproductive/Obstetrics                            Anesthesia Physical  Anesthesia Plan  ASA: III  Anesthesia Plan: General   Post-op Pain Management:    Induction: Intravenous  Airway Management Planned: Oral ETT and Video Laryngoscope Planned  Additional Equipment:   Intra-op Plan:   Post-operative Plan: Extubation in OR  Informed Consent: I have reviewed the patients History and Physical, chart, labs and discussed the procedure including the risks, benefits and alternatives for the proposed anesthesia with the patient or authorized representative who has indicated his/her understanding and acceptance.   Dental advisory given  Plan Discussed with: CRNA and Surgeon  Anesthesia Plan Comments:         Anesthesia Quick Evaluation

## 2016-01-02 NOTE — Anesthesia Postprocedure Evaluation (Signed)
Anesthesia Post Note  Patient: Amy [name]  Procedure(s) Performed: Procedure(s) (LRB): Exploration of Lumbar Fusion,Removal of hardware Lumbar One-Two ;Redo Posterior Lumbar Fusion Lumbar One-Two (N/A)  Patient location during evaluation: PACU Anesthesia Type: General Level of consciousness: awake and alert Pain management: pain level controlled Vital Signs Assessment: post-procedure vital signs reviewed and stable Respiratory status: spontaneous breathing, nonlabored ventilation, respiratory function stable and patient connected to nasal cannula oxygen Cardiovascular status: blood pressure returned to baseline and stable Postop Assessment: no signs of nausea or vomiting Anesthetic complications: no    Last Vitals:  Filed Vitals:   01/02/16 1500 01/02/16 1550  BP: 132/83   Pulse: 83   Temp:  36.5 C  Resp: 21     Last Pain:  Filed Vitals:   01/02/16 1553  PainSc: 4       LLE Sensation: No tingling;No numbness (01/02/16 1550)   RLE Sensation: No tingling;No numbness (01/02/16 1550)      Zenaida Deed

## 2016-01-02 NOTE — Transfer of Care (Signed)
Immediate Anesthesia Transfer of Care Note  Patient: Amy [name]  Procedure(s) Performed: Procedure(s): Exploration of Lumbar Fusion,Removal of hardware Lumbar One-Two ;Redo Posterior Lumbar Fusion Lumbar One-Two (N/A)  Patient Location: PACU  Anesthesia Type:General  Level of Consciousness: awake, alert  and oriented  Airway & Oxygen Therapy: Patient Spontanous Breathing and Patient connected to nasal cannula oxygen  Post-op Assessment: Report given to RN, Post -op Vital signs reviewed and stable and Patient moving all extremities X 4  Post vital signs: Reviewed and stable  Last Vitals:  Filed Vitals:   01/02/16 0945  BP: 140/63  Temp: 37.6 C  Resp: 18    Complications: No apparent anesthesia complications

## 2016-01-02 NOTE — Op Note (Signed)
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  EBL: Minimal  History of present illness: Patient is a 70 year old female 5 months ago underwent an L1 L3 fusion did very well initially however last weeks to progress worsening back pain centered on the right side workup with MRI scan and CT scan showed a well decompressed spinal canal with no stenosis however a loose right L1 screw and incomplete fusion. Due to solid-appearing hardware on the left thigh and what appears to be a dense amount of bone graft that could go on to fuse I recommended removal of loose screw and redo posterior lateral fusion on the right. I've extensively gone over the risks and benefits of the operation the patient as well as perioperative course expectations of outcome and alternatives of surgery and she understood and agreed to proceed forward.  Operative procedure: Patient brought into the or was induced under general anesthesia positioned prone on the [facility] table her back was prepped and draped in routine sterile fashion her old incision was opened up and dissection was carried out on the right side exposing the hardware from L1-L3. The rod was then disconnected the nuts remove the rods removed the right L1 screw was markedly loose and this was removed I then exposed the right TPN and at L1 and L2 aggressively decorticated the right L1 TP as well as a right L2 TP as well as residual posterior lateral bone from the previous fusion. I then copiously irrigated the wound prior to decortication and placed BMP sponges and V toss bone substitute from TP to TP. I then reconnected a short rod at L1-L2 and reconnected the nuts and anchored them in  place. Then I placed a medium Hemovac drain speckled the wound with VAC powder injected the fascia with X Burrell closed wound in layers with after Vicryl Dermabond benzo and Steri-Strips and sterile dressing. At the end of case all needle counts sponge counts were correct.

## 2016-01-03 ENCOUNTER — Encounter (HOSPITAL_COMMUNITY): Payer: Self-pay | Admitting: Neurosurgery

## 2016-01-03 MED ORDER — CALCIUM CARBONATE-VITAMIN D 500-200 MG-UNIT PO TABS
1.0000 | ORAL_TABLET | Freq: Every day | ORAL | Status: DC
  Administered 2016-01-03 – 2016-01-06 (×4): 1 via ORAL
  Filled 2016-01-03 (×4): qty 1

## 2016-01-03 NOTE — Progress Notes (Signed)
Patient ID: Amy [name], female   DOB: December 04, 1945, 70 y.o.   MRN: [id number] Patient doing well pain manageable  Strength out of 5 wound clean dry and intact  Mobilized today with physical occupational therapy

## 2016-01-03 NOTE — Evaluation (Signed)
Physical Therapy Evaluation Patient Details Name: Amy [name] MRN: [id number] DOB: June 29, 1946 Today's Date: 01/03/2016   History of Present Illness  removal hardware L1-3; redo L1-2  Clinical Impression  Patient is s/p above surgery resulting in the deficits listed below (see PT Problem List). Patient reports she does not have adequate assistance at home (husband works; daughter is disabled).  Mobility severely limited this date due to excessive drainage from her back upon sitting, requiring redressing by RN. Patient will benefit from skilled PT to increase their independence and safety with mobility (while adhering to their precautions) to allow discharge to the venue listed below.     Follow Up Recommendations SNF (noted outpatient status may preclude this; if not an option will need max HH services including aide); Supervision for mobility/OOB    Equipment Recommendations  Rolling walker with 5" wheels    Recommendations for Other Services OT consult     Precautions / Restrictions Precautions Precautions: Back;Fall Precaution Booklet Issued: Yes (comment) Precaution Comments: with cues pt able to state 3/3 back precautions; full education with visual demonstration providede Restrictions Weight Bearing Restrictions: No      Mobility  Bed Mobility Overal bed mobility: Needs Assistance Bed Mobility: Rolling;Sidelying to Sit Rolling: Min assist Sidelying to sit: Min assist;HOB elevated       General bed mobility comments: HOB elevated 25 to simulate home; +rail; no cues needed for technique  Transfers                 General transfer comment: Unable due to excessive drainage from her back  Ambulation/Gait                Stairs            Wheelchair Mobility    Modified Rankin (Stroke Patients Only)       Balance Overall balance assessment: Needs assistance Sitting-balance support: No upper extremity supported;Feet  supported Sitting balance-Leahy Scale: Fair Sitting balance - Comments: sat EOB >10 minutes waiting for RN to dress her back                                     Pertinent Vitals/Pain Pain Assessment: 0-10 Pain Score: 8  Pain Location: back Pain Descriptors / Indicators: Operative site guarding Pain Intervention(s): Limited activity within patient's tolerance;Monitored during session;Premedicated before session;Repositioned    Home Living Family/patient expects to be discharged to:: Private residence Living Arrangements: Spouse/significant other;Children (daughter) Available Help at Discharge: Family;Available PRN/intermittently (husband works; dtr venous stasis ulcers, needs THR) Type of Home: House Home Access: Stairs to enter Entrance Stairs-Rails: Right Entrance Stairs-Number of Steps: 2 Home Layout: One level Home Equipment: Cane - single point;Bedside commode;Shower seat;Other (comment) (head and feet adjustable bed)      Prior Function Level of Independence: Needs assistance   Gait / Transfers Assistance Needed: used cane outside home; at times used daughter's RW  ADL's / Homemaking Assistance Needed: assist with laundry; cooking; daughter assisted with shower        Hand Dominance        Extremity/Trunk Assessment   Upper Extremity Assessment: Overall WFL for tasks assessed           Lower Extremity Assessment: Generalized weakness;RLE deficits/detail;LLE deficits/detail      Cervical / Trunk Assessment: Other exceptions  Communication   Communication: No difficulties  Cognition Arousal/Alertness: Lethargic;Suspect due to medications Behavior During Therapy: [facility] for  tasks assessed/performed Overall Cognitive Status: Within Functional Limits for tasks assessed                      General Comments      Exercises        Assessment/Plan    PT Assessment Patient needs continued PT services  PT Diagnosis Acute pain   PT  Problem List Decreased strength;Decreased activity tolerance;Decreased balance;Decreased mobility;Decreased knowledge of use of DME;Decreased safety awareness;Decreased knowledge of precautions;Impaired sensation;Pain;Obesity  PT Treatment Interventions DME instruction;Gait training;Functional mobility training;Therapeutic activities;Balance training;Patient/family education   PT Goals (Current goals can be found in the Care Plan section) Acute Rehab PT Goals Patient Stated Goal: become independent agai PT Goal Formulation: With patient Time For Goal Achievement: 01/17/16 Potential to Achieve Goals: Good    Frequency Min 5X/week   Barriers to discharge Decreased caregiver support husband works full-time    Co-evaluation               End of Session   Activity Tolerance: Treatment limited secondary to medical complications (Comment) (excessive drainage; needed bandage change) Patient left: in bed;with call bell/phone within reach;with nursing/sitter in room (sitting at EOB with RN) Nurse Communication: Other (comment) (back drainage)    Functional Assessment Tool Used: clinical judgement Functional Limitation: Mobility: Walking and moving around Mobility: Walking and Moving Around Current Status [id number]): At least 20 percent but less than 40 percent impaired, limited or restricted Mobility: Walking and Moving Around Goal Status [phone]): At least 1 percent but less than 20 percent impaired, limited or restricted    Time: [id number] PT Time Calculation (min) (ACUTE ONLY): 40 min   Charges:   PT Evaluation $PT Eval Moderate Complexity: 1 Procedure PT Treatments $Therapeutic Activity: 23-37 mins   PT G Codes:   PT G-Codes **NOT FOR INPATIENT CLASS** Functional Assessment Tool Used: clinical judgement Functional Limitation: Mobility: Walking and moving around Mobility: Walking and Moving Around Current Status [id number]): At least 20 percent but less than 40 percent impaired,  limited or restricted Mobility: Walking and Moving Around Goal Status [phone]): At least 1 percent but less than 20 percent impaired, limited or restricted    SASSER,LYNN 01/03/2016, 9:34 AM Pager [phone]

## 2016-01-03 NOTE — Progress Notes (Signed)
Occupational Therapy Evaluation Patient Details Name: Amy [name] MRN: [id number] DOB: 03/16/1946 Today's Date: 01/03/2016    History of Present Illness removal hardware L1-3; redo L1-2   Clinical Impression   PTA, pt required assistance for most ADLs and IADLs and used RW or SPC for mobility due to severe pain. Pt currently requires mod-max assist for LB ADLs and min assist for functional mobility for balance. Began education on back precautions, brace wear protocol, and compensatory strategies for ADLs. Pt will benefit from continued acute OT to increase independence and safety with ADLs and mobility to allow for safe discharge to the venue listed below. Recommend SNF for post-acute rehab stay due to pt's lack of assistance at home and required level of care.     Follow Up Recommendations  SNF (noted outpatient status may preclude this; if not an option will need max HH services including aide) ;Supervision/Assistance - 24 hour    Equipment Recommendations  Other (comment) (TBD in next venue)    Recommendations for Other Services       Precautions / Restrictions Precautions Precautions: Back;Fall Precaution Booklet Issued: Yes (comment) Precaution Comments: Pt able to recall 1/3 back precautions at beginning of session. Reviewed handout during session. Required Braces or Orthoses: Spinal Brace Spinal Brace: Lumbar corset;Applied in sitting position Restrictions Weight Bearing Restrictions: No      Mobility Bed Mobility Overal bed mobility: Needs Assistance Bed Mobility: Rolling;Sit to Sidelying Rolling: Min assist       Sit to sidelying: Min assist General bed mobility comments: HOB flat, heavy use of bedrails. Assist to progress bilateral LE onto bed.Pt wanting to stay laying on side to sleep - pillow placed between knees.  Transfers Overall transfer level: Needs assistance Equipment used: Rolling walker (2 wheeled) Transfers: Sit to/from Stand Sit to  Stand: Min assist         General transfer comment: Min assist for boost to stand and balance. Verbal cues for safe hand placement on seated surfaces. Pt moves very slowly and requires increased time to complete tasks due to pain.    Balance Overall balance assessment: Needs assistance Sitting-balance support: Feet supported;No upper extremity supported Sitting balance-Leahy Scale: Fair     Standing balance support: Bilateral upper extremity supported;During functional activity Standing balance-Leahy Scale: Poor Standing balance comment: Reliant on RW for UE support                            ADL Overall ADL's : Needs assistance/impaired     Grooming: Wash/dry hands;Set up;Sitting Grooming Details (indicate cue type and reason): too fatigued to stand at sink after using bathroom Upper Body Bathing: Moderate assistance;Sitting   Lower Body Bathing: Maximal assistance;Sit to/from stand   Upper Body Dressing : Moderate assistance;Sitting   Lower Body Dressing: Maximal assistance;Sit to/from stand   Toilet Transfer: Minimal assistance;Cueing for safety;Ambulation;BSC;RW Toilet Transfer Details (indicate cue type and reason): Assist for balance Toileting- Clothing Manipulation and Hygiene: Minimal assistance;Sit to/from stand       Functional mobility during ADLs: Minimal assistance;Rolling walker General ADL Comments: Pt moves very slow and keeps eyes closed most of the time despite cues to look ahead for safety purposes. Began education onback precautions, brace wear protocol, and ADL compensatory strategies.     Vision Vision Assessment?: No apparent visual deficits   Perception     Praxis      Pertinent Vitals/Pain Pain Assessment: 0-10 Pain Score: 10-Worst pain ever ("25")  Pain Location: back Pain Descriptors / Indicators: Sore;Grimacing Pain Intervention(s): Limited activity within patient's tolerance;Monitored during session;Premedicated before  session;Repositioned     Hand Dominance Right   Extremity/Trunk Assessment Upper Extremity Assessment Upper Extremity Assessment: Overall WFL for tasks assessed   Lower Extremity Assessment Lower Extremity Assessment: Overall WFL for tasks assessed;RLE deficits/detail;LLE deficits/detail RLE Sensation: history of peripheral neuropathy LLE Sensation: history of peripheral neuropathy   Cervical / Trunk Assessment Cervical / Trunk Assessment: Other exceptions Cervical / Trunk Exceptions: obesity   Communication Communication Communication: No difficulties   Cognition Arousal/Alertness: Lethargic;Suspect due to medications Behavior During Therapy: [facility] for tasks assessed/performed Overall Cognitive Status: Within Functional Limits for tasks assessed       Memory: Decreased recall of precautions             General Comments       Exercises       Shoulder Instructions      Home Living Family/patient expects to be discharged to:: Private residence Living Arrangements: Spouse/significant other;Children (daughter) Available Help at Discharge: Family;Available PRN/intermittently (husband works, daughter venous statis ulcers needs THR) Type of Home: House Home Access: Stairs to enter [employer] of Steps: 2 Entrance Stairs-Rails: Right Home Layout: One level     Bathroom Shower/Tub: Occupational psychologist: Handicapped height Bathroom Accessibility: Yes How Accessible: Accessible via walker Home Equipment: [place] - single point;Bedside commode;Shower seat;Other (comment) (adjustable bed)          Prior Functioning/Environment Level of Independence: Needs assistance  Gait / Transfers Assistance Needed: used cane outside home; at times used daughter's RW ADL's / Homemaking Assistance Needed: assist with laundry; cooking; daughter assisted with shower        OT Diagnosis: Acute pain   OT Problem List: Decreased strength;Decreased range of  motion;Decreased activity tolerance;Impaired balance (sitting and/or standing);Decreased safety awareness;Decreased knowledge of use of DME or AE;Decreased knowledge of precautions;Obesity;Pain   OT Treatment/Interventions: Self-care/ADL training;Therapeutic exercise;Energy conservation;DME and/or AE instruction;Therapeutic activities;Patient/family education;Balance training    OT Goals(Current goals can be found in the care plan section) Acute Rehab OT Goals Patient Stated Goal: become independent agai OT Goal Formulation: With patient Time For Goal Achievement: 01/17/16 Potential to Achieve Goals: Good ADL Goals Pt Will Perform Grooming: standing;with supervision Pt Will Perform Lower Body Bathing: with min guard assist;with adaptive equipment;sit to/from stand;sitting/lateral leans Pt Will Perform Lower Body Dressing: with min guard assist;with adaptive equipment;sit to/from stand;sitting/lateral leans Pt Will Transfer to Toilet: with supervision;ambulating;bedside commode (over toilet) Pt Will Perform Toileting - Clothing Manipulation and hygiene: with min guard assist;with adaptive equipment;sitting/lateral leans;sit to/from stand Additional ADL Goal #1: Pt will demonstrate adherence to 3/3 back precautions to increase safety with ADLs.  OT Frequency: Min 2X/week   Barriers to D/C: Decreased caregiver support  Hsuband works, daughter has medical issues of her own       Co-evaluation              End of Session Equipment Utilized During Treatment: Back brace;Rolling walker;Gait belt Nurse Communication: Mobility status  Activity Tolerance: Patient limited by lethargy;Patient limited by pain Patient left: in bed;with call bell/phone within reach;with bed alarm set;with SCD's reapplied   Time: 1212-1240 OT Time Calculation (min): 28 min Charges:  OT General Charges $OT Visit: 1 Procedure OT Evaluation $OT Eval Moderate Complexity: 1 Procedure OT Treatments $Self  Care/Home Management : 8-22 mins G-Codes:    Redmond Baseman, OTR/L Pager: [phone] 01/03/2016, 1:40 PM

## 2016-01-04 MED ORDER — CHLORHEXIDINE GLUCONATE 0.12 % MT SOLN
OROMUCOSAL | Status: AC
  Administered 2016-01-04: 15 mL
  Filled 2016-01-04: qty 15

## 2016-01-04 NOTE — Care Management Note (Signed)
Case Management Note  Patient Details  Name: BEZAWIT BRUMMER MRN: [id number] Date of Birth: [birth year]  Subjective/Objective:   Pt underwent L 1-2 PLlF. She is from home with her spouse.                Action/Plan: PT/OT recs are for SNF. CM following for discharge needs.   Expected Discharge Date:   (Pending)               Expected Discharge Plan:     In-House Referral:     Discharge planning Services     Post Acute Care Choice:    Choice offered to:     DME Arranged:    DME Agency:     HH Arranged:    HH Agency:     Status of Service:  In process, will continue to follow  Medicare Important Message Given:    Date Medicare IM Given:    Medicare IM give by:    Date Additional Medicare IM Given:    Additional Medicare Important Message give by:     If discussed at [place] of Stay Meetings, dates discussed:    Additional Comments:  Pollie Friar, RN 01/04/2016, 11:30 AM

## 2016-01-04 NOTE — Progress Notes (Signed)
Patient ID: Amy [name], female   DOB: 08-Dec-1945, 70 y.o.   MRN: [id number] Patient doing well pain well controlled neurologic stable incision clean dry and intact  Continue physical occupational therapy.

## 2016-01-04 NOTE — Progress Notes (Signed)
Physical Therapy Treatment Patient Details Name: Amy [name] MRN: [id number] DOB: 1946-01-05 Today's Date: 01/04/2016    History of Present Illness removal hardware L1-3; redo L1-2    PT Comments    Pt progressing slowly, very lethargic today needing max stimulation to remain awake to ambulate. Ambulated 31' with RW and min A. PT will continue to follow.   Follow Up Recommendations  SNF;Supervision for mobility/OOB     Equipment Recommendations  Rolling walker with 5" wheels    Recommendations for Other Services OT consult     Precautions / Restrictions Precautions Precautions: Back;Fall Precaution Booklet Issued: No Precaution Comments: pt able to recall "bending" but needed reminder for arching and twisting Required Braces or Orthoses: Spinal Brace Spinal Brace: Lumbar corset;Applied in sitting position Restrictions Weight Bearing Restrictions: No    Mobility  Bed Mobility               General bed mobility comments: pt received in recliner  Transfers Overall transfer level: Needs assistance Equipment used: Rolling walker (2 wheeled) Transfers: Sit to/from Stand Sit to Stand: Min assist         General transfer comment: increased time needed, cues for keeping eyes opened and wt shift fwd, min A to boost  Ambulation/Gait Ambulation/Gait assistance: Min assist Ambulation Distance (Feet): 80 Feet Assistive device: Rolling walker (2 wheeled) Gait Pattern/deviations: Step-through pattern;Trunk flexed;Wide base of support Gait velocity: decreased Gait velocity interpretation: <1.8 ft/sec, indicative of risk for recurrent falls General Gait Details: vc's for erect posture and for keeping eyes open   Stairs            Wheelchair Mobility    Modified Rankin (Stroke Patients Only)       Balance Overall balance assessment: Needs assistance Sitting-balance support: Feet supported;No upper extremity supported Sitting balance-Leahy  Scale: Fair Sitting balance - Comments: pt had difficulty scooting to edge of chair but once feet were on the floor, could maintain balance without UE support   Standing balance support: Bilateral upper extremity supported Standing balance-Leahy Scale: Poor Standing balance comment: heavy reliance on RW                    Cognition Arousal/Alertness: Lethargic;Suspect due to medications Behavior During Therapy: Flat affect Overall Cognitive Status: Impaired/Different from baseline Area of Impairment: Following commands;Memory     Memory: Decreased recall of precautions Following Commands: Follows one step commands with increased time       General Comments: expect cognitive issues due to meds    Exercises      General Comments        Pertinent Vitals/Pain Pain Assessment: 0-10 Pain Score: 5  Pain Location: back Pain Descriptors / Indicators: Sore Pain Intervention(s): Limited activity within patient's tolerance;Monitored during session;Premedicated before session    Home Living                      Prior Function            PT Goals (current goals can now be found in the care plan section) Acute Rehab PT Goals Patient Stated Goal: become independent again PT Goal Formulation: With patient Time For Goal Achievement: 01/17/16 Potential to Achieve Goals: Good Progress towards PT goals: Progressing toward goals    Frequency  Min 5X/week    PT Plan Current plan remains appropriate    Co-evaluation             End of Session Equipment Utilized  During Treatment: Gait belt;Back brace Activity Tolerance: Patient limited by lethargy Patient left: in chair;with call bell/phone within reach     Time: 1145-1203 PT Time Calculation (min) (ACUTE ONLY): 18 min  Charges:  $Gait Training: 8-22 mins                    G Codes:     Leighton Roach, PT  Acute Rehab Services  [place], [place] 01/04/2016, 1:28 PM

## 2016-01-05 ENCOUNTER — Inpatient Hospital Stay (HOSPITAL_COMMUNITY): Payer: Medicare Other

## 2016-01-05 LAB — CBC WITH DIFFERENTIAL/PLATELET
Basophils Absolute: 0 10*3/uL (ref 0.0–0.1)
Basophils Relative: 0 %
Eosinophils Absolute: 0.2 10*3/uL (ref 0.0–0.7)
Eosinophils Relative: 1 %
HCT: 33.8 % — ABNORMAL LOW (ref 36.0–46.0)
Hemoglobin: 10.6 g/dL — ABNORMAL LOW (ref 12.0–15.0)
Lymphocytes Relative: 18 %
Lymphs Abs: 2 10*3/uL (ref 0.7–4.0)
MCH: 28 pg (ref 26.0–34.0)
MCHC: 31.4 g/dL (ref 30.0–36.0)
MCV: 89.2 fL (ref 78.0–100.0)
Monocytes Absolute: 2 10*3/uL — ABNORMAL HIGH (ref 0.1–1.0)
Monocytes Relative: 18 %
Neutro Abs: 7 10*3/uL (ref 1.7–7.7)
Neutrophils Relative %: 63 %
Platelets: 285 10*3/uL (ref 150–400)
RBC: 3.79 MIL/uL — ABNORMAL LOW (ref 3.87–5.11)
RDW: 15.1 % (ref 11.5–15.5)
WBC: 11.1 10*3/uL — ABNORMAL HIGH (ref 4.0–10.5)

## 2016-01-05 LAB — URINALYSIS, ROUTINE W REFLEX MICROSCOPIC
Bilirubin Urine: NEGATIVE
Glucose, UA: NEGATIVE mg/dL
Hgb urine dipstick: NEGATIVE
Ketones, ur: NEGATIVE mg/dL
Nitrite: NEGATIVE
Protein, ur: NEGATIVE mg/dL
Specific Gravity, Urine: 1.015 (ref 1.005–1.030)
pH: 5.5 (ref 5.0–8.0)

## 2016-01-05 LAB — URINE MICROSCOPIC-ADD ON: RBC / HPF: NONE SEEN RBC/hpf (ref 0–5)

## 2016-01-05 LAB — CBC AND DIFFERENTIAL: WBC: 11.1 10^3/mL

## 2016-01-05 NOTE — Progress Notes (Signed)
Occupational Therapy Treatment Patient Details Name: Amy [name] MRN: [id number] DOB: 09-May-1946 Today's Date: 01/05/2016    History of present illness removal hardware L1-3; redo L1-2   OT comments  Pt making gradual progress toward OT goals this session but was able to tolerate increased mobility and participation in ADLs. Max verbal encouragement required to participate in therapy; pt reports pain and lethargy. Pt currently min assist for toilet transfers and UB dressing and min guard for grooming activities standing at the sink; VCs required throughout to maintain back precautions. D/c plan remains appropriate. Will continue to follow acutely.    Follow Up Recommendations  SNF;Supervision/Assistance - 24 hour    Equipment Recommendations  Other (comment) (TBD)    Recommendations for Other Services      Precautions / Restrictions Precautions Precautions: Back;Fall Precaution Comments: Able to verbally recall 2/3 back precautions (no twisting). Reviewed all precautions with pt and daughter. Required Braces or Orthoses: Spinal Brace Spinal Brace: Lumbar corset;Applied in sitting position Restrictions Weight Bearing Restrictions: No       Mobility Bed Mobility Overal bed mobility: Needs Assistance Bed Mobility: Rolling;Sidelying to Sit;Sit to Sidelying Rolling: Min assist Sidelying to sit: Min assist;HOB elevated     Sit to sidelying: Min assist General bed mobility comments: Pt moves very slow. Assist with LEs.  Transfers Overall transfer level: Needs assistance Equipment used: Rolling walker (2 wheeled) Transfers: Sit to/from Stand Sit to Stand: Min assist         General transfer comment: increased time. VCs for hand placement and technique. Light min assist to boost up from EOB x 1, BSC x1     Balance Overall balance assessment: Needs assistance Sitting-balance support: Feet supported Sitting balance-Leahy Scale: Fair     Standing balance  support: No upper extremity supported;During functional activity Standing balance-Leahy Scale: Fair                     ADL Overall ADL's : Needs assistance/impaired     Grooming: Min guard;Wash/dry face;Wash/dry hands;Standing Grooming Details (indicate cue type and reason): cues to maintain back precautions         Upper Body Dressing : Minimal assistance;Sitting Upper Body Dressing Details (indicate cue type and reason): to don/doff brace and hospital gown     Toilet Transfer: Minimal assistance;Ambulation;BSC;RW   Toileting- Water quality scientist and Hygiene: Min guard;Sit to/from stand;Cueing for back precautions Toileting - Clothing Manipulation Details (indicate cue type and reason): to maintain back precautions during toilet hygiene     Functional mobility during ADLs: Minimal assistance;Rolling walker General ADL Comments: Max verbal encouragement to participate in therapy. Moves very slow and requires constant verbal stimulation to keep eyes open. Reviewed back precautions and log roll technique.      Vision                     Perception     Praxis      Cognition   Behavior During Therapy: Flat affect Overall Cognitive Status: Within Functional Limits for tasks assessed       Memory: Decreased recall of precautions               Extremity/Trunk Assessment               Exercises     Shoulder Instructions       General Comments      Pertinent Vitals/ Pain       Pain Assessment: 0-10 Pain Score: 8  Pain Location: back Pain Descriptors / Indicators: Aching;Moaning;Grimacing;Guarding;Operative site guarding Pain Intervention(s): Limited activity within patient's tolerance;Monitored during session;Patient requesting pain meds-RN notified  Home Living                                          Prior Functioning/Environment              Frequency Min 2X/week     Progress Toward Goals  OT  Goals(current goals can now be found in the care plan section)  Progress towards OT goals: Progressing toward goals (gradual progress)  Acute Rehab OT Goals Patient Stated Goal: become independent again OT Goal Formulation: With patient  Plan Discharge plan remains appropriate    Co-evaluation                 End of Session Equipment Utilized During Treatment: Gait belt;Rolling walker;Back brace   Activity Tolerance Patient limited by lethargy;Patient limited by pain   Patient Left in bed;with call bell/phone within reach;with bed alarm set;with family/visitor present   Nurse Communication Patient requests pain meds        Time: [id number] OT Time Calculation (min): 48 min  Charges: OT General Charges $OT Visit: 1 Procedure OT Treatments $Self Care/Home Management : 23-37 mins $Therapeutic Activity: 8-22 mins  Binnie Kand M.S., OTR/L Pager: [phone]  01/05/2016, 4:39 PM

## 2016-01-05 NOTE — Progress Notes (Signed)
Physical Therapy Treatment Patient Details Name: Amy [name] MRN: [id number] DOB: 1946/06/09 Today's Date: 01/05/2016    History of Present Illness removal hardware L1-3; redo L1-2    PT Comments    Patient aware that she is "talking out of her head" and that the medicines are making her very groggy. States she would rather hurt than feel this way. Required constant cues for keeping eyes open and standing straight (not bending forward).    Follow Up Recommendations  SNF;Supervision for mobility/OOB     Equipment Recommendations  Rolling walker with 5" wheels    Recommendations for Other Services       Precautions / Restrictions Precautions Precautions: Back;Fall Precaution Booklet Issued: No Precaution Comments: able to state no twisting only; required max cues throughout session to prevent bending Required Braces or Orthoses: Spinal Brace Spinal Brace: Lumbar corset;Applied in sitting position Restrictions Weight Bearing Restrictions: No    Mobility  Bed Mobility               General bed mobility comments: pt received in recliner  Transfers Overall transfer level: Needs assistance Equipment used: Rolling walker (2 wheeled) Transfers: Sit to/from Stand Sit to Stand: Min assist         General transfer comment: increased time needed, cues for keeping eyes opened and wt shift fwd, min A to boost from chair; minguard with vc from 3n1 over toilet  Ambulation/Gait Ambulation/Gait assistance: Min assist Ambulation Distance (Feet): 110 Feet Assistive device: Rolling walker (2 wheeled) Gait Pattern/deviations: Step-through pattern;Decreased stride length;Trunk flexed Gait velocity: decreased Gait velocity interpretation: Below normal speed for age/gender General Gait Details: vc's for erect posture and for keeping eyes open   Stairs            Wheelchair Mobility    Modified Rankin (Stroke Patients Only)       Balance     Sitting  balance-Leahy Scale: Fair Sitting balance - Comments: pt had difficulty scooting to edge of chair but once feet were on the floor, could maintain balance without UE support   Standing balance support: Bilateral upper extremity supported Standing balance-Leahy Scale: Poor                      Cognition Arousal/Alertness: Lethargic;Suspect due to medications Behavior During Therapy: Flat affect Overall Cognitive Status: Impaired/Different from baseline Area of Impairment: Following commands;Memory;Awareness     Memory: Decreased recall of precautions;Decreased short-term memory (who are you and why are you here? <3 min after introduction)     Awareness: Intellectual   General Comments: expect cognitive issues due to meds    Exercises      General Comments        Pertinent Vitals/Pain Pain Assessment: Faces (never would give a number) Faces Pain Scale: Hurts even more Pain Location: back and Lt buttock Pain Descriptors / Indicators: Operative site guarding Pain Intervention(s): Limited activity within patient's tolerance;Monitored during session;Repositioned    Home Living                      Prior Function            PT Goals (current goals can now be found in the care plan section) Acute Rehab PT Goals Patient Stated Goal: become independent again Time For Goal Achievement: 01/17/16 Progress towards PT goals: Progressing toward goals (slowly; lethargy limiting)    Frequency  Min 5X/week    PT Plan Current plan remains appropriate  Co-evaluation             End of Session Equipment Utilized During Treatment: Gait belt;Back brace Activity Tolerance: Patient limited by lethargy Patient left: in chair;with call bell/phone within reach;with nursing/sitter in room     Time: 0940-1010 PT Time Calculation (min) (ACUTE ONLY): 30 min  Charges:  $Gait Training: 23-37 mins                    G Codes:      SASSER,LYNN Jan 15, 2016, 10:23  AM Pager [phone]

## 2016-01-05 NOTE — Clinical Social Work Note (Signed)
Clinical Social Work Assessment  Patient Details  Name: Amy [name] MRN: [id number] Date of Birth: September 01, 1946  Date of referral:  01/05/16               Reason for consult:  Facility Placement                Permission sought to share information with:  Chartered certified accountant granted to share information::  Yes, Verbal Permission Granted  Name::      (husband)  Agency::   [facility] SNFs though [facility] is first choice)  Relationship::     Contact Information:     Housing/Transportation Living arrangements for the past 2 months:  [place] of Information:  Patient, Spouse Patient Interpreter Needed:  None Criminal Activity/Legal Involvement Pertinent to Current Situation/Hospitalization:  No - Comment as needed Significant Relationships:  Spouse Lives with:  Spouse Do you feel safe going back to the place where you live?  Yes Need for family participation in patient care:  Yes (Comment) (patient is requesting husband involvement)  Care giving concerns:  Husband did not express concerns at this time.   Social Worker assessment / plan:  CSW met with patient. Patient was feeling "groggy" due to pain medication and requested CSW speak with her husband.  CSW contacted patient's husband who states that patient is from home with him.  Husband states that the patient has hx of STR at [facility] and received satisfactory care there and would like for CSW to make referral to [facility] for STR this time as well.  CSW received verbal permission to begin SNF search of [facility] with the understanding that [facility] is patient and husband's first choice for STR.  Patient husband expressed gratitude for CSW assistance and relief knowing that patient would be able to receive STR at time of discharge.  Employment status:  Retired Forensic scientist:  Medicare PT Recommendations:  [place] /  Referral to community resources:  [place]  Patient/Family's Response to care:  Patient and husband are agreeable to STR at SNF at time of discharge.  Patient/Family's Understanding of and Emotional Response to Diagnosis, Current Treatment, and Prognosis:  Patient and husband are both realistic regarding level of care needed at time of discharge.  Patient states "I will just be glad to get back home".  Emotional Assessment Appearance:  Appears stated age Attitude/Demeanor/Rapport:    Affect (typically observed):  Accepting Orientation:  Oriented to Self, Oriented to Place, Oriented to  Time, Oriented to Situation Alcohol / Substance use:  Not Applicable Psych involvement (Current and /or in the community):  No (Comment)  Discharge Needs  Concerns to be addressed:  No discharge needs identified Readmission within the last 30 days:  No Current discharge risk:  None Barriers to Discharge:  Continued Medical Work up   [employer], LCSW 01/05/2016, 12:30 PM

## 2016-01-05 NOTE — NC FL2 (Signed)
[place] MEDICAID FL2 LEVEL OF CARE SCREENING TOOL     IDENTIFICATION  Patient Name: Amy [name] Birthdate: 1945/12/24 Sex: female Admission Date (Current Location): 01/02/2016  [facility] and [place] Number:  Herbalist and Address:  [facility] [place], [place] [address] [place], [place] [address]      Provider Number: [id number]  Attending Physician Name and Address:  Kary Kos, MD  Relative Name and Phone Number:       Current Level of Care: Hospital Recommended Level of Care: [place] Prior Approval Number:    Date Approved/Denied:   PASRR Number: [id number]  Discharge Plan: SNF    Current Diagnoses: Patient Active Problem List   Diagnosis Date Noted  . 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  . Neuropathic pain of both legs 10/13/2015  . Constipation 10/13/2015  . Complete rotator cuff tear of left shoulder 05/05/2014  . Depression 08/06/2013  . Hypokalemia 08/06/2013  . GERD (gastroesophageal reflux disease) 08/06/2013  . Spinal stenosis of lumbar region 08/06/2013  . Bladder spasm 08/06/2013  . Arthritis 08/06/2013  . Obesity, Class III, BMI 40-49.9 (morbid obesity) ([place]) 02/03/2013  . Lipoma of buttock s/p excision 02/20/2013 02/03/2013  . DYSPNEA 01/23/2008  . COUGH 01/23/2008  . OBSTRUCTIVE SLEEP APNEA 09/12/2007  . Essential hypertension 09/12/2007  . ALLERGIC RHINITIS 09/12/2007  . SLEEPINESS 09/12/2007  . ANGINA, HX OF 09/12/2007    Orientation RESPIRATION BLADDER Height & Weight     Self, Time, Situation, Place  Normal Continent Weight: 267 lb 4.8 oz (121.246 kg) Height:  5' 5.5" (166.4 cm)  BEHAVIORAL SYMPTOMS/MOOD NEUROLOGICAL BOWEL NUTRITION STATUS      Continent    AMBULATORY STATUS COMMUNICATION OF NEEDS Skin   Limited Assist Verbally Surgical wounds                       Personal Care Assistance Level of Assistance  Dressing, Bathing Bathing Assistance: Limited  assistance   Dressing Assistance: Limited assistance     Functional Limitations Info             SPECIAL CARE FACTORS FREQUENCY  PT (By licensed PT), OT (By licensed OT)                    Contractures Contractures Info: Present    Additional Factors Info  Allergies   Allergies Info: latex, penicillins           Current Medications (01/05/2016):  This is the current hospital active medication list Current Facility-Administered Medications  Medication Dose Route Frequency Provider Last Rate Last Dose  . 0.9 %  sodium chloride infusion  250 mL Intravenous Continuous Kary Kos, MD   250 mL at 01/02/16 1748  . acetaminophen (TYLENOL) tablet 650 mg  650 mg Oral Q4H PRN Kary Kos, MD   650 mg at 01/05/16 0549   Or  . acetaminophen (TYLENOL) suppository 650 mg  650 mg Rectal Q4H PRN Kary Kos, MD      . azelaic acid (AZELEX) 20 % cream 1 application  1 application Topical BID Kary Kos, MD   1 application at [id number]  . B-complex with vitamin C tablet 1 tablet  1 tablet Oral Daily Kary Kos, MD   1 tablet at 01/05/16 1021  . bupivacaine liposome (EXPAREL) 1.3 % injection 266 mg  20 mL Infiltration Once Kary Kos, MD   266 mg at 01/02/16 1749  . calcium-vitamin D (OSCAL WITH  D) 500-200 MG-UNIT per tablet 1 tablet  1 tablet Oral Daily Kary Kos, MD   1 tablet at 01/05/16 1020  . cloNIDine (CATAPRES) tablet 0.1 mg  0.1 mg Oral Daily Kary Kos, MD   0.1 mg at 01/05/16 1021  . cyclobenzaprine (FLEXERIL) tablet 10 mg  10 mg Oral TID PRN Kary Kos, MD   10 mg at 01/05/16 0511  . diphenhydrAMINE (BENADRYL) capsule 25 mg  25 mg Oral BID PRN Kary Kos, MD      . estradiol (ESTRACE) vaginal cream 1 Applicatorful  1 Applicatorful Vaginal Once per day on Mon Thu Kary Kos, MD   1 Applicatorful at [id number]  . gabapentin (NEURONTIN) capsule 300 mg  300 mg Oral QHS Kary Kos, MD   300 mg at 01/04/16 2207  . glycopyrrolate (ROBINUL) tablet 1 mg  1 mg Oral BID Kary Kos, MD   1 mg at  01/05/16 1020  . hydrALAZINE (APRESOLINE) tablet 25 mg  25 mg Oral TID Kary Kos, MD   25 mg at 01/05/16 1021  . losartan (COZAAR) tablet 100 mg  100 mg Oral Daily Kary Kos, MD   100 mg at 01/05/16 1020   And  . hydrochlorothiazide (HYDRODIURIL) tablet 25 mg  25 mg Oral Daily Kary Kos, MD   25 mg at 01/05/16 1021  . HYDROmorphone (DILAUDID) injection 0.5-1 mg  0.5-1 mg Intravenous Q2H PRN Kary Kos, MD   1 mg at 01/04/16 2216  . l-methylfolate-B6-B12 (METANX) 3-35-2 MG per tablet 1 tablet  1 tablet Oral BID Kary Kos, MD   1 tablet at 01/05/16 1019  . lactated ringers infusion   Intravenous Continuous Jillyn Hidden, MD 50 mL/hr at 01/03/16 0019    . menthol-cetylpyridinium (CEPACOL) lozenge 3 mg  1 lozenge Oral PRN Kary Kos, MD       Or  . phenol (CHLORASEPTIC) mouth spray 1 spray  1 spray Mouth/Throat PRN Kary Kos, MD      . multivitamin with minerals tablet 1 tablet  1 tablet Oral Daily Kary Kos, MD   1 tablet at 01/05/16 1019  . ondansetron (ZOFRAN) injection 4 mg  4 mg Intravenous Q4H PRN Kary Kos, MD      . oxyCODONE-acetaminophen (PERCOCET/ROXICET) 5-325 MG per tablet 1-2 tablet  1-2 tablet Oral Q4H PRN Kary Kos, MD   2 tablet at 01/05/16 0510  . pantoprazole (PROTONIX) EC tablet 40 mg  40 mg Oral Daily Kary Kos, MD   40 mg at 01/05/16 1021  . PARoxetine (PAXIL) tablet 40 mg  40 mg Oral Daily Kary Kos, MD   40 mg at 01/05/16 1020  . potassium chloride SA (K-DUR,KLOR-CON) CR tablet 20 mEq  20 mEq Oral Daily Kary Kos, MD   20 mEq at 01/05/16 1019  . sodium chloride flush (NS) 0.9 % injection 3 mL  3 mL Intravenous Q12H Kary Kos, MD   3 mL at 01/05/16 1020  . sodium chloride flush (NS) 0.9 % injection 3 mL  3 mL Intravenous PRN Kary Kos, MD      . tamsulosin [facility]) capsule 0.4 mg  0.4 mg Oral Daily Kary Kos, MD   0.4 mg at 01/05/16 1021  . vitamin B-12 (CYANOCOBALAMIN) tablet 1,000 mcg  1,000 mcg Oral QHS Kary Kos, MD   1,000 mcg at 01/04/16 2207  . vitamin E capsule 400 Units   400 Units Oral Daily Kary Kos, MD   400 Units at 01/05/16 1020     Discharge Medications:  Please see discharge summary for a list of discharge medications.  Relevant Imaging Results:  Relevant Lab Results:   Additional Information SSN [id number]  Dulcy Fanny, [place]

## 2016-01-05 NOTE — Care Management Important Message (Signed)
Important Message  Patient Details  Name: Amy [name] MRN: [id number] Date of Birth: 05-27-1946   Medicare Important Message Given:  Yes    Megan P Shular 01/05/2016, 1:00 PM

## 2016-01-05 NOTE — Progress Notes (Signed)
Subjective: Patient reports Doing well pain still there but improving slowly  Objective: Vital signs in last 24 hours: Temp:  [98.3 F (36.8 C)-101.3 F (38.5 C)] 101.3 F (38.5 C) (04/13 0539) Pulse Rate:  [90-99] 99 (04/13 0539) Resp:  [17-20] 17 (04/13 0539) BP: (121-168)/(59-64) 149/64 mmHg (04/13 0539) SpO2:  [96 %-98 %] 98 % (04/13 0539)  Intake/Output from previous day: 04/12 0701 - 04/13 0700 In: 480 [P.O.:480] Out: -  Intake/Output this shift:    Strength out of 5 wound clean dry and intact  Lab Results: No results for input(s): WBC, HGB, HCT, PLT in the last 72 hours. BMET No results for input(s): NA, K, CL, CO2, GLUCOSE, BUN, CREATININE, CALCIUM in the last 72 hours.  Studies/Results: No results found.  Assessment/Plan: Neurologically patient's postop day 3 from lumbar fusion in situ removal of hardware for pseudoarthrosis patient seems to be doing well neurologically. Patient is a fever this morning we'll check a white count chest x-ray urinalysis and blood cultures also encourage incentive spirometry. Pending fever workup if it's negative patient is stable for transfer to rehabilitation whenever bed becomes available.  LOS: 3 days     CRAM,GARY P 01/05/2016, 7:31 AM

## 2016-01-06 DIAGNOSIS — M6281 Muscle weakness (generalized): Secondary | ICD-10-CM | POA: Diagnosis not present

## 2016-01-06 DIAGNOSIS — R5381 Other malaise: Secondary | ICD-10-CM | POA: Diagnosis not present

## 2016-01-06 DIAGNOSIS — R1030 Lower abdominal pain, unspecified: Secondary | ICD-10-CM | POA: Diagnosis not present

## 2016-01-06 DIAGNOSIS — K449 Diaphragmatic hernia without obstruction or gangrene: Secondary | ICD-10-CM | POA: Diagnosis not present

## 2016-01-06 DIAGNOSIS — M549 Dorsalgia, unspecified: Secondary | ICD-10-CM | POA: Diagnosis not present

## 2016-01-06 DIAGNOSIS — G5791 Unspecified mononeuropathy of right lower limb: Secondary | ICD-10-CM | POA: Diagnosis not present

## 2016-01-06 DIAGNOSIS — R262 Difficulty in walking, not elsewhere classified: Secondary | ICD-10-CM | POA: Diagnosis not present

## 2016-01-06 DIAGNOSIS — I10 Essential (primary) hypertension: Secondary | ICD-10-CM | POA: Diagnosis not present

## 2016-01-06 DIAGNOSIS — K219 Gastro-esophageal reflux disease without esophagitis: Secondary | ICD-10-CM | POA: Diagnosis not present

## 2016-01-06 DIAGNOSIS — M4806 Spinal stenosis, lumbar region: Secondary | ICD-10-CM | POA: Diagnosis not present

## 2016-01-06 DIAGNOSIS — K5900 Constipation, unspecified: Secondary | ICD-10-CM | POA: Diagnosis not present

## 2016-01-06 DIAGNOSIS — R600 Localized edema: Secondary | ICD-10-CM | POA: Diagnosis not present

## 2016-01-06 DIAGNOSIS — D72829 Elevated white blood cell count, unspecified: Secondary | ICD-10-CM | POA: Diagnosis not present

## 2016-01-06 DIAGNOSIS — R278 Other lack of coordination: Secondary | ICD-10-CM | POA: Diagnosis not present

## 2016-01-06 DIAGNOSIS — Z4789 Encounter for other orthopedic aftercare: Secondary | ICD-10-CM | POA: Diagnosis not present

## 2016-01-06 DIAGNOSIS — G5792 Unspecified mononeuropathy of left lower limb: Secondary | ICD-10-CM | POA: Diagnosis not present

## 2016-01-06 DIAGNOSIS — D62 Acute posthemorrhagic anemia: Secondary | ICD-10-CM | POA: Diagnosis not present

## 2016-01-06 DIAGNOSIS — F329 Major depressive disorder, single episode, unspecified: Secondary | ICD-10-CM | POA: Diagnosis not present

## 2016-01-06 DIAGNOSIS — M455 Ankylosing spondylitis of thoracolumbar region: Secondary | ICD-10-CM | POA: Diagnosis not present

## 2016-01-06 DIAGNOSIS — M545 Low back pain: Secondary | ICD-10-CM | POA: Diagnosis not present

## 2016-01-06 DIAGNOSIS — K5901 Slow transit constipation: Secondary | ICD-10-CM | POA: Diagnosis not present

## 2016-01-06 DIAGNOSIS — S32009K Unspecified fracture of unspecified lumbar vertebra, subsequent encounter for fracture with nonunion: Secondary | ICD-10-CM | POA: Diagnosis not present

## 2016-01-06 DIAGNOSIS — M792 Neuralgia and neuritis, unspecified: Secondary | ICD-10-CM | POA: Diagnosis not present

## 2016-01-06 MED ORDER — CYCLOBENZAPRINE HCL 10 MG PO TABS: 10.0000 mg | ORAL_TABLET | Freq: Three times a day (TID) | ORAL | Status: DC | PRN

## 2016-01-06 MED ORDER — OXYCODONE-ACETAMINOPHEN 10-325 MG PO TABS: 1.0000 | ORAL_TABLET | Freq: Four times a day (QID) | ORAL | Status: DC | PRN

## 2016-01-06 MED ORDER — LEVOFLOXACIN 500 MG PO TABS
500.0000 mg | ORAL_TABLET | Freq: Every day | ORAL | Status: DC
  Administered 2016-01-06: 500 mg via ORAL
  Filled 2016-01-06: qty 1

## 2016-01-06 NOTE — Discharge Summary (Signed)
Physician Discharge Summary  Patient ID: Amy [name] MRN: [id number] DOB/AGE: [birth year] 70 y.o.  Admit date: 01/02/2016 Discharge date: 01/06/2016  Admission Diagnoses: Pseudoarthrosis with loosening of hardware   Discharge Diagnoses: Same   Discharged Condition: good  Hospital Course: The patient was admitted on 01/02/2016 and taken to the operating room where the patient underwent redo fusion. The patient tolerated the procedure well and was taken to the recovery room and then to the floor in stable condition. The hospital course was routine. There were no complications. The wound remained clean dry and intact. Pt had appropriate back soreness. No complaints of leg pain or new N/T/W. The patient remained afebrile with stable vital signs, and tolerated a regular diet. The patient continued to increase activities, and pain was well controlled with oral pain medications.   Consults: None  Significant Diagnostic Studies:  Results for orders placed or performed during the hospital encounter of 01/02/16  Surgical pcr screen  Result Value Ref Range   MRSA, PCR NEGATIVE NEGATIVE   Staphylococcus aureus NEGATIVE NEGATIVE  Basic metabolic panel  Result Value Ref Range   Sodium 140 135 - 145 mmol/L   Potassium 3.6 3.5 - 5.1 mmol/L   Chloride 100 (L) 101 - 111 mmol/L   CO2 29 22 - 32 mmol/L   Glucose, Bld 111 (H) 65 - 99 mg/dL   BUN 17 6 - 20 mg/dL   Creatinine, Ser 0.91 0.44 - 1.00 mg/dL   Calcium 9.1 8.9 - 10.3 mg/dL   GFR calc non Af Amer >60 >60 mL/min   GFR calc Af Amer >60 >60 mL/min   Anion gap 11 5 - 15  CBC  Result Value Ref Range   WBC 4.3 4.0 - 10.5 K/uL   RBC 4.19 3.87 - 5.11 MIL/uL   Hemoglobin 11.7 (L) 12.0 - 15.0 g/dL   HCT 36.8 36.0 - 46.0 %   MCV 87.8 78.0 - 100.0 fL   MCH 27.9 26.0 - 34.0 pg   MCHC 31.8 30.0 - 36.0 g/dL   RDW 14.4 11.5 - 15.5 %   Platelets 335 150 - 400 K/uL  Urinalysis, Routine w reflex microscopic (not at [facility])  Result Value  Ref Range   Color, Urine YELLOW YELLOW   APPearance CLOUDY (A) CLEAR   Specific Gravity, Urine 1.015 1.005 - 1.030   pH 5.5 5.0 - 8.0   Glucose, UA NEGATIVE NEGATIVE mg/dL   Hgb urine dipstick NEGATIVE NEGATIVE   Bilirubin Urine NEGATIVE NEGATIVE   Ketones, ur NEGATIVE NEGATIVE mg/dL   Protein, ur NEGATIVE NEGATIVE mg/dL   Nitrite NEGATIVE NEGATIVE   Leukocytes, UA MODERATE (A) NEGATIVE  Urine microscopic-add on  Result Value Ref Range   Squamous Epithelial / LPF 6-30 (A) NONE SEEN   WBC, UA 6-30 0 - 5 WBC/hpf   RBC / HPF NONE SEEN 0 - 5 RBC/hpf   Bacteria, UA RARE (A) NONE SEEN  CBC with Differential/Platelet  Result Value Ref Range   WBC 11.1 (H) 4.0 - 10.5 K/uL   RBC 3.79 (L) 3.87 - 5.11 MIL/uL   Hemoglobin 10.6 (L) 12.0 - 15.0 g/dL   HCT 33.8 (L) 36.0 - 46.0 %   MCV 89.2 78.0 - 100.0 fL   MCH 28.0 26.0 - 34.0 pg   MCHC 31.4 30.0 - 36.0 g/dL   RDW 15.1 11.5 - 15.5 %   Platelets 285 150 - 400 K/uL   Neutrophils Relative % 63 %   Neutro Abs 7.0 1.7 -  7.7 K/uL   Lymphocytes Relative 18 %   Lymphs Abs 2.0 0.7 - 4.0 K/uL   Monocytes Relative 18 %   Monocytes Absolute 2.0 (H) 0.1 - 1.0 K/uL   Eosinophils Relative 1 %   Eosinophils Absolute 0.2 0.0 - 0.7 K/uL   Basophils Relative 0 %   Basophils Absolute 0.0 0.0 - 0.1 K/uL    Dg Chest 2 View  01/05/2016  CLINICAL DATA:  Postop.  Fever. EXAM: CHEST  2 VIEW COMPARISON:  12/25/2015 FINDINGS: Cardiac silhouette is normal in size and configuration. No mediastinal or hilar masses or evidence of adenopathy. Lungs are clear.  No pleural effusion or pneumothorax. Bony thorax is demineralized but intact. IMPRESSION: No active cardiopulmonary disease. Electronically Signed   By: Lajean Manes M.D.   On: 01/05/2016 09:08   Dg Chest 2 View  12/25/2015  CLINICAL DATA:  Low back pain, chest pain EXAM: CHEST  2 VIEW COMPARISON:  04/21/2014 FINDINGS: The heart size and mediastinal contours are within normal limits. Both lungs are clear. The  visualized skeletal structures are unremarkable. IMPRESSION: No active cardiopulmonary disease. Electronically Signed   By: Skipper Cliche M.D.   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 EXAM: CT LUMBAR SPINE WITHOUT CONTRAST TECHNIQUE: Multidetector CT imaging of the lumbar spine was performed without intravenous contrast administration. Multiplanar CT image reconstructions were also generated. COMPARISON:  11/30/2015 FINDINGS: Status post L4-5 and L5-S1 anterior lumbar interbody fusion. There has also been posterior fusion and decompression at these levels. Bony fusion is solid. Hardware is intact. L3-4 posterior lumbar interbody fusion. Previous rod and pedicle screw at this level has been removed. An intervertebral graft is fixed and located. There is solid intervertebral and posterior bony fusion. On the right is a newly seen fracture lucency along the posterior lateral fusion. Although interval, margins appear smooth and sclerotic, suggesting subacute timing and healing. Anteriorly and medially there is healing or sparing. Mild anterior inferior corner irregularity at L3 is stable from prior; no additional fracture is suspected at this level. L2-3 posterior lateral fusion which is solid. Most recent surgery at L1-2 with PLIF and posterior rod and screw fixation. Bony fusion is not yet demonstrated. There is stable lucency around the right L1 screw. Given stable unilateral appearance, this may have been present since surgery. There is a newly seen lucency through the right upper endplate of L2, 075-GRM. No indication of new herniation or progressive stenosis. Distorted posterior soft tissues at the surgical levels, with fatty atrophy. No gross fluid collection IMPRESSION: 1. Nondisplaced fracture across right posterior-lateral bony fusion at L3-4. Fracture has developed since 12/06/2015 CT but already show signs of  chronicity/healing. 2. Recent L1-2 PLIF. Questionable nondepressed superior endplate fracture at L2. No progressive cage subsidence. Bony fusion is not yet visualized. Lucency around the right L1 screw is stable. 3. Solid posterior fusion from L2-S1. Intervertebral bony fusion from L3-S1. Electronically Signed   By: Monte Fantasia M.D.   On: 12/28/2015 16:51    Antibiotics:  Anti-infectives    Start     Dose/Rate Route Frequency Ordered Stop   01/06/16 1000  levofloxacin (LEVAQUIN) tablet 500 mg     500 mg Oral Daily 01/06/16 0549     01/02/16 1700  ceFAZolin (ANCEF) IVPB 1 g/50 mL premix     1 g 100 mL/hr over 30 Minutes Intravenous Every 8 hours 01/02/16 1606 01/03/16 0045  01/02/16 1255  vancomycin (VANCOCIN) powder  Status:  Discontinued       As needed 01/02/16 1255 01/02/16 1300   01/02/16 1219  bacitracin 50,000 Units in sodium chloride irrigation 0.9 % 500 mL irrigation  Status:  Discontinued       As needed 01/02/16 1220 01/02/16 1300   01/02/16 1211  vancomycin (VANCOCIN) 1000 MG powder    Comments:  Day, Dory   : cabinet override      01/02/16 1211 01/03/16 0029   01/02/16 1100  vancomycin (VANCOCIN) 1,500 mg in sodium chloride 0.9 % 500 mL IVPB     1,500 mg 250 mL/hr over 120 Minutes Intravenous To [facility] 12/30/15 0907 01/02/16 1220      Discharge Exam: Blood pressure 130/66, pulse 90, temperature 98.8 F (37.1 C), temperature source Oral, resp. rate 18, height 5' 5.5" (1.664 m), weight 121.246 kg (267 lb 4.8 oz), SpO2 97 %. Neurologic: Grossly normal Incision clean dry and intact  Discharge Medications:     Medication List    TAKE these medications        azelaic acid 20 % cream  Commonly known as:  AZELEX  Apply 1 application topically 2 (two) times daily. After skin is thoroughly washed and patted dry, gently but thoroughly massage a thin film of azelaic acid cream into the affected area twice daily, in the morning and evening.     CALCIUM-VITAMIN  D3 PO  Take 1 tablet by mouth daily.     cloNIDine 0.1 MG tablet  Commonly known as:  CATAPRES  Take 0.1 mg by mouth every 12 (twelve) hours as needed. For Systolic > 99991111     cyclobenzaprine 10 MG tablet  Commonly known as:  FLEXERIL  Take 1 tablet (10 mg total) by mouth 3 (three) times daily as needed for muscle spasms.     diphenhydrAMINE 25 mg capsule  Commonly known as:  BENADRYL  Take 25 mg by mouth 2 (two) times daily as needed for allergies.     ESTRACE VAGINAL 0.1 MG/GM vaginal cream  Generic drug:  estradiol  Place 1 application vaginally 2 (two) times a week.     gabapentin 300 MG capsule  Commonly known as:  NEURONTIN  Take 300 mg by mouth at bedtime.     glycopyrrolate 2 MG tablet  Commonly known as:  ROBINUL  Take 1 mg by mouth 2 (two) times daily.     hydrALAZINE 25 MG tablet  Commonly known as:  APRESOLINE  Take 25 mg by mouth 3 (three) times daily.     losartan-hydrochlorothiazide 100-25 MG tablet  Commonly known as:  HYZAAR  Take 1 tablet by mouth every morning.     METANX PO  Take 1 tablet by mouth 2 (two) times daily.     multivitamin with minerals Tabs tablet  Take 1 tablet by mouth daily.     oxyCODONE-acetaminophen 5-325 MG tablet  Commonly known as:  PERCOCET/ROXICET  Take 1-2 tablets by mouth every 4 (four) hours as needed for moderate pain.     oxyCODONE-acetaminophen 10-325 MG tablet  Commonly known as:  PERCOCET  Take 1 tablet by mouth every 6 (six) hours as needed for pain.     pantoprazole 40 MG tablet  Commonly known as:  PROTONIX  Take 40 mg by mouth daily.     PARoxetine 40 MG tablet  Commonly known as:  PAXIL  Take 40 mg by mouth daily.     potassium chloride SA  20 MEQ tablet  Commonly known as:  K-DUR,KLOR-CON  Take 20 mEq by mouth daily.     silodosin 8 MG Caps capsule  Commonly known as:  RAPAFLO  Take 8 mg by mouth daily as needed (for bladder).     VITAMIN B COMPLEX PO  Take 1 tablet by mouth daily.     vitamin  B-12 1000 MCG tablet  Commonly known as:  CYANOCOBALAMIN  Take 1,000 mcg by mouth at bedtime.     vitamin E 400 UNIT capsule  Take 400 Units by mouth daily.        Disposition: Skilled nursing facility   Final Dx: Redo fusion      Discharge Instructions    Call MD for:  difficulty breathing, headache or visual disturbances    Complete by:  As directed      Call MD for:  persistant nausea and vomiting    Complete by:  As directed      Call MD for:  redness, tenderness, or signs of infection (pain, swelling, redness, odor or green/yellow discharge around incision site)    Complete by:  As directed      Call MD for:  severe uncontrolled pain    Complete by:  As directed      Call MD for:  temperature >100.4    Complete by:  As directed      Diet - low sodium heart healthy    Complete by:  As directed      Discharge instructions    Complete by:  As directed   No strenuous activity, no heavy lifting, no bending or twisting, may shower     Increase activity slowly    Complete by:  As directed            Follow-up Information    Follow up with CRAM,GARY P, MD. Schedule an appointment as soon as possible for a visit in 2 weeks.   Specialty:  Neurosurgery   Contact information:   [address] [place] [address] [phone]        Signed: Eustace Moore 01/06/2016, 11:40 AM

## 2016-01-06 NOTE — Clinical Social Work Placement (Signed)
   CLINICAL SOCIAL WORK PLACEMENT  NOTE  Date:  01/06/2016  Patient Details  Name: Amy [name] MRN: [id number] Date of Birth: [birth year]  Clinical Social Work is seeking post-discharge placement for this patient at the [place] level of care (*CSW will initial, date and re-position this form in  chart as items are completed):  Yes   Patient/family provided with [place] Work Department's list of facilities offering this level of care within the geographic area requested by the patient (or if unable, by the patient's family).  Yes   Patient/family informed of their freedom to choose among providers that offer the needed level of care, that participate in Medicare, Medicaid or managed care program needed by the patient, have an available bed and are willing to accept the patient.  Yes   Patient/family informed of [facility]'s ownership interest in [facility] and [facility], as well as of the fact that they are under no obligation to receive care at these facilities.  PASRR submitted to EDS on       PASRR number received on       Existing PASRR number confirmed on 01/05/16     FL2 transmitted to all facilities in geographic area requested by pt/family on 01/05/16     FL2 transmitted to all facilities within larger geographic area on       Patient informed that his/her managed care company has contracts with or will negotiate with certain facilities, including the following:        Yes   Patient/family informed of bed offers received.  Patient chooses bed at  [facility])     Physician recommends and patient chooses bed at      Patient to be transferred to  [facility]) on 01/06/16.  Patient to be transferred to facility by  [facility])     Patient family notified on 01/06/16 of transfer.  Name of family member notified:  Patient and family aware     PHYSICIAN Please prepare prescriptions     Additional Comment:     _______________________________________________ Raymondo Band, LCSW 01/06/2016, 2:23 PM

## 2016-01-06 NOTE — Progress Notes (Signed)
Patient has accepted bed offer at [facility]. Facility has been informed of patient and family's selection. Per facility representative Hoyle Sauer, patient can arrive to facility at anytime now. CSW informed patient and family of transfer time. Patient and family appreciative of CSW services.   Patient to be transported via [place] to [facility] on today. D/C Summary sent via Hub system. No further needs were requested at this time. CSW to sign off.   Please re-consult if further CSW needs arise.   Lucius Conn, [place] Worker [facility] Ph: [phone]

## 2016-01-06 NOTE — Progress Notes (Signed)
Physical Therapy Treatment Patient Details Name: Amy [name] MRN: [id number] DOB: [birth year] Today's Date: 01/06/2016    History of Present Illness removal hardware L1-3; redo L1-2    PT Comments    Pt performed increased acitvity and mobility remains to require min-min guard assistance.  Pt to d/c to SNF today to [facility].  Pt tolerated tx well but complains of increased pain.  RN informed of patient complaints.    Follow Up Recommendations  SNF;Supervision for mobility/OOB     Equipment Recommendations  Rolling walker with 5" wheels    Recommendations for Other Services       Precautions / Restrictions Precautions Precautions: Back;Fall Precaution Booklet Issued: No Precaution Comments: Able to verbally recall 2/3 back precautions (no twisting). Reviewed all precautions with pt. Required Braces or Orthoses: Spinal Brace Spinal Brace: Lumbar corset;Applied in sitting position Restrictions Weight Bearing Restrictions: No    Mobility  Bed Mobility Overal bed mobility:  (Pt sitting edge of bed on arrival post lunch.  )                Transfers Overall transfer level: Needs assistance Equipment used: Rolling walker (2 wheeled) Transfers: Sit to/from Stand Sit to Stand: Min assist         General transfer comment: increased time. VCs for hand placement and technique. Light min assist to boost up from EOB x 1, BSC x1   Ambulation/Gait Ambulation/Gait assistance: Min assist Ambulation Distance (Feet): 120 Feet Assistive device: Rolling walker (2 wheeled) Gait Pattern/deviations: Step-through pattern;Decreased stride length Gait velocity: decreased   General Gait Details: VCs for erect posture and RW position.  Cues to keep body in neutral alignment during turns to avoid twisting.     Stairs            Wheelchair Mobility    Modified Rankin (Stroke Patients Only)       Balance   Sitting-balance support: Feet supported Sitting  balance-Leahy Scale: Fair Sitting balance - Comments: improved ability to scoot to edge of bed.       Standing balance-Leahy Scale: Fair                      Cognition Arousal/Alertness: Awake/alert Behavior During Therapy: WFL for tasks assessed/performed Overall Cognitive Status: Within Functional Limits for tasks assessed                      Exercises      General Comments        Pertinent Vitals/Pain Pain Assessment: 0-10 Pain Score: 8  Pain Location: back Pain Descriptors / Indicators: Grimacing;Guarding Pain Intervention(s): Limited activity within patient's tolerance;Repositioned    Home Living                      Prior Function            PT Goals (current goals can now be found in the care plan section) Acute Rehab PT Goals Patient Stated Goal: become independent again Potential to Achieve Goals: Good Progress towards PT goals: Progressing toward goals    Frequency  Min 5X/week    PT Plan Current plan remains appropriate    Co-evaluation             End of Session Equipment Utilized During Treatment: Gait belt;Back brace Activity Tolerance: Patient limited by lethargy Patient left: with chair alarm set     Time: 1208-1232 PT Time Calculation (min) (ACUTE ONLY):  24 min  Charges:  $Gait Training: 8-22 mins $Therapeutic Activity: 8-22 mins                    G Codes:      Cristela Blue 01/18/16, 12:48 PM  Governor Rooks, PTA pager [phone]

## 2016-01-06 NOTE — Progress Notes (Signed)
Pt for discharge to SNF today. Discharge orders received. IV  dcd. Discharge instructions and 2  prescriptions given with verbalized understanding. PTAR here to transport patient to [facility]. Discharged from [facility] at 1637.

## 2016-01-06 NOTE — Care Management Note (Signed)
Case Management Note  Patient Details  Name: Amy [name] MRN: [id number] Date of Birth: 29-Dec-1945  Subjective/Objective:                    Action/Plan: Patient discharging to SNF today. No further needs per CM.   Expected Discharge Date:   (Pending)               Expected Discharge Plan:  Skilled Nursing Facility  In-House Referral:  Clinical Social Work  Discharge planning Services  CM Consult  Post Acute Care Choice:    Choice offered to:     DME Arranged:    DME Agency:     HH Arranged:    [place] Agency:     Status of Service:  Completed, signed off  Medicare Important Message Given:  Yes Date Medicare IM Given:    Medicare IM give by:    Date Additional Medicare IM Given:    Additional Medicare Important Message give by:     If discussed at [place] of Stay Meetings, dates discussed:    Additional Comments:  Pollie Friar, RN 01/06/2016, 11:51 AM

## 2016-01-09 ENCOUNTER — Non-Acute Institutional Stay (SKILLED_NURSING_FACILITY): Payer: Medicare Other | Admitting: Internal Medicine

## 2016-01-09 ENCOUNTER — Encounter: Payer: Self-pay | Admitting: Internal Medicine

## 2016-01-09 DIAGNOSIS — K449 Diaphragmatic hernia without obstruction or gangrene: Secondary | ICD-10-CM | POA: Diagnosis not present

## 2016-01-09 DIAGNOSIS — D62 Acute posthemorrhagic anemia: Secondary | ICD-10-CM | POA: Diagnosis not present

## 2016-01-09 DIAGNOSIS — R5381 Other malaise: Secondary | ICD-10-CM | POA: Diagnosis not present

## 2016-01-09 DIAGNOSIS — F329 Major depressive disorder, single episode, unspecified: Secondary | ICD-10-CM | POA: Diagnosis not present

## 2016-01-09 DIAGNOSIS — K219 Gastro-esophageal reflux disease without esophagitis: Secondary | ICD-10-CM

## 2016-01-09 DIAGNOSIS — R600 Localized edema: Secondary | ICD-10-CM | POA: Diagnosis not present

## 2016-01-09 DIAGNOSIS — R1030 Lower abdominal pain, unspecified: Secondary | ICD-10-CM | POA: Diagnosis not present

## 2016-01-09 DIAGNOSIS — M48062 Spinal stenosis, lumbar region with neurogenic claudication: Secondary | ICD-10-CM

## 2016-01-09 DIAGNOSIS — F32A Depression, unspecified: Secondary | ICD-10-CM

## 2016-01-09 DIAGNOSIS — I10 Essential (primary) hypertension: Secondary | ICD-10-CM | POA: Diagnosis not present

## 2016-01-09 DIAGNOSIS — M792 Neuralgia and neuritis, unspecified: Secondary | ICD-10-CM | POA: Diagnosis not present

## 2016-01-09 DIAGNOSIS — K5900 Constipation, unspecified: Secondary | ICD-10-CM | POA: Diagnosis not present

## 2016-01-09 DIAGNOSIS — D72829 Elevated white blood cell count, unspecified: Secondary | ICD-10-CM | POA: Diagnosis not present

## 2016-01-09 DIAGNOSIS — M4806 Spinal stenosis, lumbar region: Secondary | ICD-10-CM

## 2016-01-09 NOTE — Progress Notes (Signed)
LOCATION: Amy [name]  PCP: Amy Casco, MD   Code Status: DNR  Goals of care: Advanced Directive information Advanced Directives 01/09/2016  Does patient have an advance directive? Yes  Type of Advance Directive Out of facility DNR (pink MOST or yellow form)  Does patient want to make changes to advanced directive? No - Patient declined  Copy of advanced directive(s) in chart? Yes  Would patient like information on creating an advanced directive? -       Extended Emergency Contact Information Primary Emergency Contact: Amy [name] Address: [address]          [place], [place] [address] [place] Phone: [phone] Mobile Phone: [phone] Relation: Spouse Secondary Emergency Contact: Amy [name] [place] Phone: [phone] Mobile Phone: [phone] Relation: Daughter   Allergies  Allergen Reactions  . Latex Other (See Comments)    Sores, blisters  . Penicillins Rash    UNKNOWN but able to take AMPICILLIN     Chief Complaint  Patient presents with  . Readmit To SNF    Readmission     HPI:  Patient is a 70 y.o. female seen today for short term rehabilitation post hospital admission from 01/02/16-01/06/16 with pseudoarthrosis with loosening of hardware. She underwent exploration of fusion removal of hardware on the right from L1-L3 removal of right L1 screw, redo posterior lateral fusion L1-L2. She is seen in her room today. Her back pain is under control with current pain medication  Review of Systems:  Constitutional: Negative for fever, chills, diaphoresis. Energy level is slowly coming back.  HENT: Negative for headache, congestion, nasal discharge, hearing loss, sore throat. Positive for occasional difficulty swallowing. Taking small bites and sips helps.   Eyes: Negative for blurred vision, double vision and discharge.  Respiratory: Negative for cough, shortness of breath  and wheezing.   Cardiovascular: Negative for chest pain, palpitations. Positive for leg swelling.  Gastrointestinal: Negative for  nausea, vomiting, loss of appetite. Positive for heartburn and abdominal pain. Last bowel movement was yesterday after a week Genitourinary: Negative for dysuria.  Musculoskeletal: Negative for back pain, fall in facility.  Skin: Negative for itching, rash.  Neurological: Negative for dizziness. Psychiatric/Behavioral: Negative for depression   Past Medical History  Diagnosis Date  . Anxiety   . Dysphonia     intermittent "voice changes"  . Chronic back pain     lumbar steroid injection's  . Seasonal allergies   . Fibromyalgia   . Weakness     both hands and feet  . Urinary frequency   . Glaucoma   . History of MRSA infection 2011  . History of DVT (deep vein thrombosis) 1989    LEFT UPPER ARM  . Hypertension   . Short of breath on exertion   . Depression   . GERD (gastroesophageal reflux disease)   . Pelvic pain in female   . Arthritis     ddd- RA  . Irregular heart rate     "years ago"  . Peripheral vascular disease ([place])     "poor circulation"  . Mild obstructive sleep apnea     per study 02-07-2006 - no cpap  . Asthma     "sleeping asthma"  . History of hiatal hernia   . Headache   . Neuropathy (HCC)     feet  . Low iron   . Constipation    Past Surgical History  Procedure Laterality Date  . Abdominal hysterectomy  1978  . Nose surgery  2007  . Mass excision Left 02/20/2013    Procedure: EXCISION LEFT BUTTOCK  MASS;  Surgeon: Adin Hector, MD;  Location: [facility];  Service: General;  Laterality: Left;  . Esophagogastroduodenoscopy N/A 07/15/2013    Procedure: ESOPHAGOGASTRODUODENOSCOPY (EGD);  Surgeon: Winfield Cunas., MD;  Location: [facility];  Service: Endoscopy;  Laterality: N/A;  need xray  . Abdominal adhesions removed    . Colonoscopy    . Bunionectomy Left 2008  . Shoulder open rotator cuff repair Left 05/05/2014     Procedure: OPEN ACROMIONECTOMY AND OPEN REPAIR OF ROTATOR CUFF, TISSUEMEND GRAFT WITH ANCHOR ;  Surgeon: Tobi Bastos, MD;  Location: [facility];  Service: Orthopedics;  Laterality: Left;  . Esophageal manometry N/A 11/22/2014    Procedure: ESOPHAGEAL MANOMETRY (EM);  Surgeon: Winfield Cunas., MD;  Location: [facility];  Service: Endoscopy;  Laterality: N/A;  . Lumbar fusion  2014    L4 -- L5  . Removal hardware l4-l5/  bilateral laminectomy l2 - l5 and fusion  06-12-2011  . Laparoscopic cholecystectomy  01-11-2006  . Cysto with hydrodistension N/A 07/12/2015    Procedure: CYSTOSCOPY/HYDRODISTENSION;  Surgeon: Bjorn Loser, MD;  Location: [facility];  Service: Urology;  Laterality: N/A;  . Eye surgery Bilateral     cataract surgery with lens implants  . Tonsillectomy    . Hardware removal N/A 01/02/2016    Procedure: Exploration of Lumbar Fusion,Removal of hardware Lumbar One-Two ;Redo Posterior Lumbar Fusion Lumbar One-Two;  Surgeon: Kary Kos, MD;  Location: [facility];  Service: Neurosurgery;  Laterality: N/A;   Social History:   reports that she quit smoking about 46 years ago. She has never used smokeless tobacco. She reports that she drinks alcohol. She reports that she does not use illicit drugs.  Family History  Problem Relation Age of Onset  . Hypertension Mother   . Cancer Mother     cervix  . Kidney disease Mother   . Hypertension Sister   . Cancer Sister     polyps  . Kidney disease Brother   . Cancer Daughter     leukemia  . Hypertension Brother     Medications:   Medication List       This list is accurate as of: 01/09/16  4:01 PM.  Always use your most recent med list.               azelaic acid 20 % cream  Commonly known as:  AZELEX  Apply 1 application topically 2 (two) times daily. After skin is thoroughly washed and patted dry, gently but thoroughly massage a thin film of azelaic acid cream into the affected area twice  daily, in the morning and evening.     CALCIUM-VITAMIN D3 PO  Take 1 tablet by mouth daily.     cloNIDine 0.1 MG tablet  Commonly known as:  CATAPRES  Take 0.1 mg by mouth every 12 (twelve) hours as needed. For Systolic > 99991111     cyclobenzaprine 10 MG tablet  Commonly known as:  FLEXERIL  Take 1 tablet (10 mg total) by mouth 3 (three) times daily as needed for muscle spasms.     diphenhydrAMINE 25 mg capsule  Commonly known as:  BENADRYL  Take 25 mg by mouth 2 (two) times daily as needed for allergies.     ESTRACE VAGINAL 0.1 MG/GM vaginal cream  Generic  drug:  estradiol  Place 1 application vaginally 2 (two) times a week.     gabapentin 300 MG capsule  Commonly known as:  NEURONTIN  Take 300 mg by mouth at bedtime.     glycopyrrolate 2 MG tablet  Commonly known as:  ROBINUL  Take 1 mg by mouth 2 (two) times daily.     hydrALAZINE 25 MG tablet  Commonly known as:  APRESOLINE  Take 25 mg by mouth 3 (three) times daily.     losartan-hydrochlorothiazide 100-25 MG tablet  Commonly known as:  HYZAAR  Take 1 tablet by mouth every morning.     METANX PO  Take 1 tablet by mouth 2 (two) times daily.     multivitamin with minerals Tabs tablet  Take 1 tablet by mouth daily.     oxyCODONE-acetaminophen 5-325 MG tablet  Commonly known as:  PERCOCET/ROXICET  Take 1-2 tablets by mouth every 4 (four) hours as needed for moderate pain.     pantoprazole 40 MG tablet  Commonly known as:  PROTONIX  Take 40 mg by mouth daily.     PARoxetine 40 MG tablet  Commonly known as:  PAXIL  Take 40 mg by mouth daily.     potassium chloride SA 20 MEQ tablet  Commonly known as:  K-DUR,KLOR-CON  Take 20 mEq by mouth daily.     silodosin 8 MG Caps capsule  Commonly known as:  RAPAFLO  Take 8 mg by mouth daily as needed (for bladder).     VITAMIN B COMPLEX PO  Take 1 tablet by mouth daily.     vitamin B-12 1000 MCG tablet  Commonly known as:  CYANOCOBALAMIN  Take 1,000 mcg by mouth at  bedtime.     vitamin E 400 UNIT capsule  Take 400 Units by mouth daily.        Immunizations: Immunization History  Administered Date(s) Administered  . PPD Test 10/03/2015, 01/06/2016     Physical Exam: Filed Vitals:   01/09/16 1541  BP: 114/66  Pulse: 92  Temp: 98.4 F (36.9 C)  TempSrc: Oral  Resp: 20  Height: 5' 5.5" (1.664 m)  Weight: 267 lb 4.8 oz (121.246 kg)  SpO2: 96%   Body mass index is 43.79 kg/(m^2).  General- elderly female, morbidly obese, in no acute distress Head- normocephalic, atraumatic Nose- no maxillary or frontal sinus tenderness, no nasal discharge Throat- moist mucus membrane  Eyes- no pallor, no icterus, no discharge, normal conjunctiva, normal sclera Neck- no cervical lymphadenopathy Cardiovascular- normal s1,s2, no murmur, palpable dorsalis pedis and radial pulses, 1+ leg edema Respiratory- bilateral clear to auscultation, no wheeze, no rhonchi, no crackles, no use of accessory muscles Abdomen- bowel sounds present, soft, tenderness to lower abdominal quadrant on palpation. No guarding, rigidity or rebound tenderness Musculoskeletal- able to move all 4 extremities, unsteady gait  Neurological- no focal deficit, alert and oriented to person, place and time Skin- warm and dry, lumbar incision with honeycomb dressing Psychiatry- normal mood and affect    Labs reviewed: Basic Metabolic Panel:  Recent Labs  09/21/15 0911 10/05/15 12/25/15 1651 12/29/15 0834  NA 139 141 138 140  K 4.2 3.9 3.7 3.6  CL 103  --  101 100*  CO2 29  --  27 29  GLUCOSE 113*  --  101* 111*  BUN 12 12 14 17   CREATININE 1.04* 1.0 1.04* 0.91  CALCIUM 9.4  --  8.9 9.1   Liver Function Tests:  Recent Labs  10/05/15  AST 23  ALT 23  ALKPHOS 106   No results for input(s): LIPASE, AMYLASE in the last 8760 hours. No results for input(s): AMMONIA in the last 8760 hours. CBC:  Recent Labs  10/05/15  12/25/15 1651 12/29/15 0834 01/05/16 01/05/16 1334    WBC  --   < > 4.3 4.3 11.1 11.1*  NEUTROABS 43  --   --   --   --  7.0  HGB 10.1*  --  12.2 11.7*  --  10.6*  HCT 32*  --  36.9 36.8  --  33.8*  MCV  --   --  88.5 87.8  --  89.2  PLT 415*  --  331 335  --  285  < > = values in this interval not displayed. Cardiac Enzymes: No results for input(s): CKTOTAL, CKMB, CKMBINDEX, TROPONINI in the last 8760 hours. BNP: Invalid input(s): POCBNP CBG: No results for input(s): GLUCAP in the last 8760 hours.  Radiological Exams: Dg Chest 2 View  01/05/2016  CLINICAL DATA:  Postop.  Fever. EXAM: CHEST  2 VIEW COMPARISON:  12/25/2015 FINDINGS: Cardiac silhouette is normal in size and configuration. No mediastinal or hilar masses or evidence of adenopathy. Lungs are clear.  No pleural effusion or pneumothorax. Bony thorax is demineralized but intact. IMPRESSION: No active cardiopulmonary disease. Electronically Signed   By: Lajean Manes M.D.   On: 01/05/2016 09:08   Dg Chest 2 View  12/25/2015  CLINICAL DATA:  Low back pain, chest pain EXAM: CHEST  2 VIEW COMPARISON:  04/21/2014 FINDINGS: The heart size and mediastinal contours are within normal limits. Both lungs are clear. The visualized skeletal structures are unremarkable. IMPRESSION: No active cardiopulmonary disease. Electronically Signed   By: Skipper Cliche M.D.   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 EXAM: CT LUMBAR SPINE WITHOUT CONTRAST TECHNIQUE: Multidetector CT imaging of the lumbar spine was performed without intravenous contrast administration. Multiplanar CT image reconstructions were also generated. COMPARISON:  11/30/2015 FINDINGS: Status post L4-5 and L5-S1 anterior lumbar interbody fusion. There has also been posterior fusion and decompression at these levels. Bony fusion is solid. Hardware is intact. L3-4 posterior lumbar interbody fusion. Previous rod and pedicle screw at this level has been  removed. An intervertebral graft is fixed and located. There is solid intervertebral and posterior bony fusion. On the right is a newly seen fracture lucency along the posterior lateral fusion. Although interval, margins appear smooth and sclerotic, suggesting subacute timing and healing. Anteriorly and medially there is healing or sparing. Mild anterior inferior corner irregularity at L3 is stable from prior; no additional fracture is suspected at this level. L2-3 posterior lateral fusion which is solid. Most recent surgery at L1-2 with PLIF and posterior rod and screw fixation. Bony fusion is not yet demonstrated. There is stable lucency around the right L1 screw. Given stable unilateral appearance, this may have been present since surgery. There is a newly seen lucency through the right upper endplate of L2, 075-GRM. No indication of new herniation or progressive stenosis. Distorted posterior soft tissues at the surgical levels, with fatty atrophy. No gross fluid collection IMPRESSION: 1. Nondisplaced fracture across right posterior-lateral bony fusion at L3-4. Fracture has developed since 12/06/2015 CT but already show signs of chronicity/healing. 2. Recent L1-2 PLIF. Questionable nondepressed superior endplate fracture at L2. No progressive cage subsidence. Bony fusion is not yet visualized. Lucency around the right  L1 screw is stable. 3. Solid posterior fusion from L2-S1. Intervertebral bony fusion from L3-S1. Electronically Signed   By: Monte Fantasia M.D.   On: 12/28/2015 16:51    Assessment/Plan  Physical deconditioning Will have her work with physical therapy and occupational therapy team to help with gait training and muscle strengthening exercises.fall precautions. Skin care. Encourage to be out of bed.   Lumbar spinal stenosis S/p exploration of fusion removal of hardware on the right from L1-L3 removal of right L1 screw, redo posterior lateral fusion L1-L2. Has f/u with neurosurgery. Continue  oxycodone-apap 5-325 mg 1-2 tab q4h prn pain. Back precautions and to wear her back brace. Will have patient work with PT/OT as tolerated to regain strength and restore function.  Fall precautions are in place. Continue calcium and vitamin d. Continue neurontin for neuropathic pain. Continue flexeril as needed for muscle spasm  Leukocytosis Afebrile at present, check cbc with diff  Blood loss anemia Post op, monitor cbc  Lower abdominal pain Send u/a with c/s to rule out UTI. Had been constipated for more than a week and had a bowel movement yesterday. This could be contributing to some of the discomfort as well. Check cmp  Leg edema Add ted hose and monitor  Constipation Add senna s 2 tab qhs and miralax daily for now and monitor, encouraged hydration  HTN Stable bp, monitor, continue losartan-hctz 100-25 mg daily, hydralazine 25 mg tid and prn clonidine, check bp bid x 1 week  gerd Positive symptom. continue protonix but increase to 40 mg bid x 1 week and then 40 mg daily and monitor  Depression Stable mood, continue paxil home regimen  Neuropathic pain Continue gabapentin 300 mg qhs for now    Goals of care: short term rehabilitation   Labs/tests ordered: cbc, cmp 01/10/16  Family/ staff Communication: reviewed care plan with patient and nursing supervisor    Blanchie Serve, MD Internal Medicine [place], [place] [address] Cell Phone (Monday-Friday 8 am - 5 pm): [phone] On Call: [phone] and follow prompts after 5 pm and on weekends Office Phone: [phone] Office Fax: [phone]

## 2016-01-10 LAB — HEPATIC FUNCTION PANEL
ALT: 48 U/L — AB (ref 7–35)
AST: 30 U/L (ref 13–35)
Alkaline Phosphatase: 173 U/L — AB (ref 25–125)
Bilirubin, Total: 0.2 mg/dL

## 2016-01-10 LAB — CULTURE, BLOOD (ROUTINE X 2)
Culture: NO GROWTH
Culture: NO GROWTH

## 2016-01-10 LAB — BASIC METABOLIC PANEL
BUN: 12 mg/dL (ref 4–21)
Creatinine: 0.9 mg/dL (ref 0.5–1.1)
Glucose: 95 mg/dL
Potassium: 4.4 mmol/L (ref 3.4–5.3)
Sodium: 140 mmol/L (ref 137–147)

## 2016-01-10 LAB — CBC AND DIFFERENTIAL
HCT: 35 % — AB (ref 36–46)
Hemoglobin: 10.8 g/dL — AB (ref 12.0–16.0)
Platelets: 431 10*3/uL — AB (ref 150–399)
WBC: 6 10^3/mL

## 2016-01-18 ENCOUNTER — Encounter: Payer: Self-pay | Admitting: Family

## 2016-01-18 ENCOUNTER — Non-Acute Institutional Stay (SKILLED_NURSING_FACILITY): Payer: Medicare Other | Admitting: Family

## 2016-01-18 DIAGNOSIS — G5791 Unspecified mononeuropathy of right lower limb: Secondary | ICD-10-CM | POA: Diagnosis not present

## 2016-01-18 DIAGNOSIS — F329 Major depressive disorder, single episode, unspecified: Secondary | ICD-10-CM

## 2016-01-18 DIAGNOSIS — K5901 Slow transit constipation: Secondary | ICD-10-CM

## 2016-01-18 DIAGNOSIS — S32009K Unspecified fracture of unspecified lumbar vertebra, subsequent encounter for fracture with nonunion: Secondary | ICD-10-CM

## 2016-01-18 DIAGNOSIS — K219 Gastro-esophageal reflux disease without esophagitis: Secondary | ICD-10-CM | POA: Diagnosis not present

## 2016-01-18 DIAGNOSIS — F32A Depression, unspecified: Secondary | ICD-10-CM

## 2016-01-18 DIAGNOSIS — G5793 Unspecified mononeuropathy of bilateral lower limbs: Secondary | ICD-10-CM

## 2016-01-18 DIAGNOSIS — I10 Essential (primary) hypertension: Secondary | ICD-10-CM | POA: Diagnosis not present

## 2016-01-18 DIAGNOSIS — G5792 Unspecified mononeuropathy of left lower limb: Secondary | ICD-10-CM | POA: Diagnosis not present

## 2016-01-18 NOTE — Progress Notes (Deleted)
Location:  [facility] Room Number: 807 Place of Service:  SNF [phone])  Provider: Marlowe Sax, NP  PCP: Osborne Casco, MD Patient Care Team: Kelton Pillar, MD as PCP - General [facility])  Extended Emergency Contact Information Primary Emergency Contact: [name] Address: [address]          [place], [place] [address] [name] of [place] Phone: [phone] Mobile Phone: [phone] Relation: Spouse Secondary Emergency Contact: [name] Address: [address]          [place], [place] [address] [name] of [place] Phone: [phone] Mobile Phone: [phone] Relation: Daughter  Code Status: *** Goals of care:  Advanced Directive information Advanced Directives 01/18/2016  Does patient have an advance directive? Yes  Type of Advance Directive Out of facility DNR (pink MOST or yellow form)  Does patient want to make changes to advanced directive? No - Patient declined  Copy of advanced directive(s) in chart? Yes     Allergies  Allergen Reactions  . Latex Other (See Comments)    Sores, blisters  . Penicillins Rash    UNKNOWN but able to take AMPICILLIN     Chief Complaint  Patient presents with  . Discharge Note    Discharged from SNF    HPI:  70 y.o. female      Past Medical History  Diagnosis Date  . Anxiety   . Dysphonia     intermittent "voice changes"  . Chronic back pain     lumbar steroid injection's  . Seasonal allergies   . Fibromyalgia   . Weakness     both hands and feet  . Urinary frequency   . Glaucoma   . History of MRSA infection 2011  . History of DVT (deep vein thrombosis) 1989    LEFT UPPER ARM  . Hypertension   . Short of breath on exertion   . Depression   . GERD (gastroesophageal reflux disease)   . Pelvic pain in female   . Arthritis     ddd- RA  . Irregular heart rate     "years ago"  . Peripheral vascular disease ([place])     "poor  circulation"  . Mild obstructive sleep apnea     per study 02-07-2006 - no cpap  . Asthma     "sleeping asthma"  . History of hiatal hernia   . Headache   . Neuropathy (HCC)     feet  . Low iron   . Constipation     Past Surgical History  Procedure Laterality Date  . Abdominal hysterectomy  1978  . Nose surgery  2007  . Mass excision Left 02/20/2013    Procedure: EXCISION LEFT BUTTOCK  MASS;  Surgeon: Adin Hector, MD;  Location: [facility];  Service: General;  Laterality: Left;  . Esophagogastroduodenoscopy N/A 07/15/2013    Procedure: ESOPHAGOGASTRODUODENOSCOPY (EGD);  Surgeon: Winfield Cunas., MD;  Location: [facility];  Service: Endoscopy;  Laterality: N/A;  need xray  . Abdominal adhesions removed    . Colonoscopy    . Bunionectomy Left 2008  . Shoulder open rotator cuff repair Left 05/05/2014    Procedure: OPEN ACROMIONECTOMY AND OPEN REPAIR OF ROTATOR CUFF, TISSUEMEND GRAFT WITH ANCHOR ;  Surgeon: Tobi Bastos, MD;  Location: [facility];  Service: Orthopedics;  Laterality: Left;  . Esophageal manometry N/A 11/22/2014    Procedure: ESOPHAGEAL MANOMETRY (EM);  Surgeon: Winfield Cunas., MD;  Location: [facility];  Service: Endoscopy;  Laterality: N/A;  . Lumbar fusion  2014    L4 -- L5  . Removal hardware l4-l5/  bilateral laminectomy l2 - l5 and fusion  06-12-2011  . Laparoscopic cholecystectomy  01-11-2006  . Cysto with hydrodistension N/A 07/12/2015    Procedure: CYSTOSCOPY/HYDRODISTENSION;  Surgeon: Bjorn Loser, MD;  [facility];  Service: Urology;  Laterality: N/A;  . Eye surgery Bilateral     cataract surgery with lens implants  . Tonsillectomy    . Hardware removal N/A 01/02/2016    Procedure: Exploration of Lumbar Fusion,Removal of hardware Lumbar One-Two ;Redo Posterior Lumbar Fusion Lumbar One-Two;  Surgeon: Kary Kos, MD;  Location: [facility];  Service: Neurosurgery;  Laterality: N/A;      reports that she quit smoking  about 46 years ago. She has never used smokeless tobacco. She reports that she drinks alcohol. She reports that she does not use illicit drugs. Social History   Social History  . Marital Status: Married    Spouse Name: N/A  . Number of Children: N/A  . Years of Education: N/A   Occupational History  . Not on file.   Social History Main Topics  . Smoking status: Former Smoker    Quit date: 07/10/1969  . Smokeless tobacco: Never Used  . Alcohol Use: Yes     Comment: rare  . Drug Use: No  . Sexual Activity: Not on file   Other Topics Concern  . Not on file   Social History Narrative   Functional Status Survey:    Allergies  Allergen Reactions  . Latex Other (See Comments)    Sores, blisters  . Penicillins Rash    UNKNOWN but able to take AMPICILLIN     Pertinent  Health Maintenance Due  Topic Date Due  . COLONOSCOPY  01/23/1996  . DEXA SCAN  01/23/2011  . PNA vac Low Risk Adult (1 of 2 - PCV13) 01/23/2011  . INFLUENZA VACCINE  04/24/2016  . MAMMOGRAM  11/28/2017    Medications:   Medication List       This list is accurate as of: 01/18/16  9:16 AM.  Always use your most recent med list.               azelaic acid 20 % cream  Commonly known as:  AZELEX  Apply 1 application topically 2 (two) times daily. After skin is thoroughly washed and patted dry, gently but thoroughly massage a thin film of azelaic acid cream into the affected area twice daily, in the morning and evening.     CALCIUM-VITAMIN D3 PO  Take 1 tablet by mouth daily.     cloNIDine 0.1 MG tablet  Commonly known as:  CATAPRES  Take 0.1 mg by mouth every 12 (twelve) hours as needed. For Systolic > [id number]     cyclobenzaprine 10 MG tablet  Commonly known as:  FLEXERIL  Take 1 tablet (10 mg total) by mouth 3 (three) times daily as needed for muscle spasms.     diphenhydrAMINE 25 mg capsule  Commonly known as:  BENADRYL  Take 25 mg by mouth 2 (two) times daily as needed for allergies.      ESTRACE VAGINAL 0.1 MG/GM vaginal cream  Generic drug:  estradiol  Place 1 application vaginally 2 (two) times a week.     gabapentin 300 MG capsule  Commonly known as:  NEURONTIN  Take 300 mg by mouth at bedtime.     glycopyrrolate 2  MG tablet  Commonly known as:  ROBINUL  Take 1 mg by mouth 2 (two) times daily.     hydrALAZINE 25 MG tablet  Commonly known as:  APRESOLINE  Take 25 mg by mouth 3 (three) times daily.     losartan-hydrochlorothiazide 100-25 MG tablet  Commonly known as:  HYZAAR  Take 1 tablet by mouth every morning.     METANX PO  Take 1 tablet by mouth 2 (two) times daily.     multivitamin with minerals Tabs tablet  Take 1 tablet by mouth daily.     oxyCODONE-acetaminophen 5-325 MG tablet  Commonly known as:  PERCOCET/ROXICET  Take 1-2 tablets by mouth every 4 (four) hours as needed for moderate pain.     pantoprazole 40 MG tablet  Commonly known as:  PROTONIX  Take 40 mg by mouth daily.     PARoxetine 40 MG tablet  Commonly known as:  PAXIL  Take 40 mg by mouth daily. Reported on 01/18/2016     polyethylene glycol packet  Commonly known as:  MIRALAX / GLYCOLAX  Take 17 g by mouth daily.     potassium chloride SA 20 MEQ tablet  Commonly known as:  K-DUR,KLOR-CON  Take 20 mEq by mouth daily.     sennosides-docusate sodium 8.6-50 MG tablet  Commonly known as:  SENOKOT-S  Take 2 tablets by mouth at bedtime.     silodosin 8 MG Caps capsule  Commonly known as:  RAPAFLO  Take 8 mg by mouth daily as needed (urinary retention).     VITAMIN B COMPLEX PO  Take 1 tablet by mouth daily.     vitamin B-12 1000 MCG tablet  Commonly known as:  CYANOCOBALAMIN  Take 1,000 mcg by mouth at bedtime.     vitamin E 400 UNIT capsule  Take 400 Units by mouth daily.        Review of Systems  Filed Vitals:   01/18/16 0825  BP: 179/85  Pulse: 102  Temp: 98.4 F (36.9 C)  Resp: 18  Height: 5\' 5"  (1.651 m)  Weight: 287 lb 6.4 oz (130.364 kg)  SpO2: 97%     Body mass index is 47.83 kg/(m^2). Physical Exam  Labs reviewed: Basic Metabolic Panel:  Recent Labs  09/21/15 0911  12/25/15 1651 12/29/15 0834 01/10/16  NA 139  < > 138 140 140  K 4.2  < > 3.7 3.6 4.4  CL 103  --  101 100*  --   CO2 29  --  27 29  --   GLUCOSE 113*  --  101* 111*  --   BUN 12  < > 14 17 12   CREATININE 1.04*  < > 1.04* 0.91 0.9  CALCIUM 9.4  --  8.9 9.1  --   < > = values in this interval not displayed. Liver Function Tests:  Recent Labs  10/05/15 01/10/16  AST 23 30  ALT 23 48*  ALKPHOS 106 173*   No results for input(s): LIPASE, AMYLASE in the last 8760 hours. No results for input(s): AMMONIA in the last 8760 hours. CBC:  Recent Labs  10/05/15  12/25/15 1651 12/29/15 0834 01/05/16 01/05/16 1334 01/10/16  WBC  --   < > 4.3 4.3 11.1 11.1* 6.0  NEUTROABS 43  --   --   --   --  7.0  --   HGB 10.1*  --  12.2 11.7*  --  10.6* 10.8*  HCT 32*  --  36.9 36.8  --  33.8* 35*  MCV  --   --  88.5 87.8  --  89.2  --   PLT 415*  --  331 335  --  285 431*  < > = values in this interval not displayed. Cardiac Enzymes: No results for input(s): CKTOTAL, CKMB, CKMBINDEX, TROPONINI in the last 8760 hours. BNP: Invalid input(s): POCBNP CBG: No results for input(s): GLUCAP in the last 8760 hours.  Procedures and Imaging Studies During Stay: Dg Chest 2 View  01/05/2016  CLINICAL DATA:  Postop.  Fever. EXAM: CHEST  2 VIEW COMPARISON:  12/25/2015 FINDINGS: Cardiac silhouette is normal in size and configuration. No mediastinal or hilar masses or evidence of adenopathy. Lungs are clear.  No pleural effusion or pneumothorax. Bony thorax is demineralized but intact. IMPRESSION: No active cardiopulmonary disease. Electronically Signed   By: Lajean Manes M.D.   On: 01/05/2016 09:08   Dg Chest 2 View  12/25/2015  CLINICAL DATA:  Low back pain, chest pain EXAM: CHEST  2 VIEW COMPARISON:  04/21/2014 FINDINGS: The heart size and mediastinal contours are within normal  limits. Both lungs are clear. The visualized skeletal structures are unremarkable. IMPRESSION: No active cardiopulmonary disease. Electronically Signed   By: Skipper Cliche M.D.   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 EXAM: CT LUMBAR SPINE WITHOUT CONTRAST TECHNIQUE: Multidetector CT imaging of the lumbar spine was performed without intravenous contrast administration. Multiplanar CT image reconstructions were also generated. COMPARISON:  11/30/2015 FINDINGS: Status post L4-5 and L5-S1 anterior lumbar interbody fusion. There has also been posterior fusion and decompression at these levels. Bony fusion is solid. Hardware is intact. L3-4 posterior lumbar interbody fusion. Previous rod and pedicle screw at this level has been removed. An intervertebral graft is fixed and located. There is solid intervertebral and posterior bony fusion. On the right is a newly seen fracture lucency along the posterior lateral fusion. Although interval, margins appear smooth and sclerotic, suggesting subacute timing and healing. Anteriorly and medially there is healing or sparing. Mild anterior inferior corner irregularity at L3 is stable from prior; no additional fracture is suspected at this level. L2-3 posterior lateral fusion which is solid. Most recent surgery at L1-2 with PLIF and posterior rod and screw fixation. Bony fusion is not yet demonstrated. There is stable lucency around the right L1 screw. Given stable unilateral appearance, this may have been present since surgery. There is a newly seen lucency through the right upper endplate of L2, 075-GRM. No indication of new herniation or progressive stenosis. Distorted posterior soft tissues at the surgical levels, with fatty atrophy. No gross fluid collection IMPRESSION: 1. Nondisplaced fracture across right posterior-lateral bony fusion at L3-4. Fracture has developed since 12/06/2015 CT  but already show signs of chronicity/healing. 2. Recent L1-2 PLIF. Questionable nondepressed superior endplate fracture at L2. No progressive cage subsidence. Bony fusion is not yet visualized. Lucency around the right L1 screw is stable. 3. Solid posterior fusion from L2-S1. Intervertebral bony fusion from L3-S1. Electronically Signed   By: Monte Fantasia M.D.   On: 12/28/2015 16:51    Assessment/Plan:   There are no diagnoses linked to this encounter.   Patient is being discharged with the following home health services:    Patient is being discharged with the following durable medical equipment:    Patient has been advised to f/u with their PCP in 1-2 weeks to bring them up to  date on their rehab stay.  Social services at facility was responsible for arranging this appointment.  Pt was provided with a 30 day supply of prescriptions for medications and refills must be obtained from their PCP.  For controlled substances, a more limited supply may be provided adequate until PCP appointment only.  Future labs/tests needed:  ***

## 2016-01-20 DIAGNOSIS — Z4789 Encounter for other orthopedic aftercare: Secondary | ICD-10-CM | POA: Diagnosis not present

## 2016-01-20 DIAGNOSIS — G629 Polyneuropathy, unspecified: Secondary | ICD-10-CM | POA: Diagnosis not present

## 2016-01-20 DIAGNOSIS — M150 Primary generalized (osteo)arthritis: Secondary | ICD-10-CM | POA: Diagnosis not present

## 2016-01-20 DIAGNOSIS — F329 Major depressive disorder, single episode, unspecified: Secondary | ICD-10-CM | POA: Diagnosis not present

## 2016-01-20 DIAGNOSIS — I10 Essential (primary) hypertension: Secondary | ICD-10-CM | POA: Diagnosis not present

## 2016-01-20 DIAGNOSIS — M455 Ankylosing spondylitis of thoracolumbar region: Secondary | ICD-10-CM | POA: Diagnosis not present

## 2016-01-20 DIAGNOSIS — M792 Neuralgia and neuritis, unspecified: Secondary | ICD-10-CM | POA: Diagnosis not present

## 2016-01-20 DIAGNOSIS — M6281 Muscle weakness (generalized): Secondary | ICD-10-CM | POA: Diagnosis not present

## 2016-01-24 DIAGNOSIS — M792 Neuralgia and neuritis, unspecified: Secondary | ICD-10-CM | POA: Diagnosis not present

## 2016-01-24 DIAGNOSIS — Z4789 Encounter for other orthopedic aftercare: Secondary | ICD-10-CM | POA: Diagnosis not present

## 2016-01-24 DIAGNOSIS — F329 Major depressive disorder, single episode, unspecified: Secondary | ICD-10-CM | POA: Diagnosis not present

## 2016-01-24 DIAGNOSIS — M6281 Muscle weakness (generalized): Secondary | ICD-10-CM | POA: Diagnosis not present

## 2016-01-24 DIAGNOSIS — I10 Essential (primary) hypertension: Secondary | ICD-10-CM | POA: Diagnosis not present

## 2016-01-24 DIAGNOSIS — M455 Ankylosing spondylitis of thoracolumbar region: Secondary | ICD-10-CM | POA: Diagnosis not present

## 2016-01-25 DIAGNOSIS — K224 Dyskinesia of esophagus: Secondary | ICD-10-CM | POA: Diagnosis not present

## 2016-01-25 DIAGNOSIS — K5900 Constipation, unspecified: Secondary | ICD-10-CM | POA: Diagnosis not present

## 2016-01-26 DIAGNOSIS — Z4789 Encounter for other orthopedic aftercare: Secondary | ICD-10-CM | POA: Diagnosis not present

## 2016-01-26 DIAGNOSIS — K219 Gastro-esophageal reflux disease without esophagitis: Secondary | ICD-10-CM | POA: Diagnosis not present

## 2016-01-26 DIAGNOSIS — M455 Ankylosing spondylitis of thoracolumbar region: Secondary | ICD-10-CM | POA: Diagnosis not present

## 2016-01-26 DIAGNOSIS — K224 Dyskinesia of esophagus: Secondary | ICD-10-CM | POA: Diagnosis not present

## 2016-01-26 DIAGNOSIS — M792 Neuralgia and neuritis, unspecified: Secondary | ICD-10-CM | POA: Diagnosis not present

## 2016-01-26 DIAGNOSIS — M6281 Muscle weakness (generalized): Secondary | ICD-10-CM | POA: Diagnosis not present

## 2016-01-26 DIAGNOSIS — M4806 Spinal stenosis, lumbar region: Secondary | ICD-10-CM | POA: Diagnosis not present

## 2016-01-26 DIAGNOSIS — I10 Essential (primary) hypertension: Secondary | ICD-10-CM | POA: Diagnosis not present

## 2016-01-26 DIAGNOSIS — F329 Major depressive disorder, single episode, unspecified: Secondary | ICD-10-CM | POA: Diagnosis not present

## 2016-01-28 ENCOUNTER — Emergency Department: Payer: Medicare Other

## 2016-01-28 ENCOUNTER — Encounter: Payer: Self-pay | Admitting: Emergency Medicine

## 2016-01-28 ENCOUNTER — Emergency Department
Admission: EM | Admit: 2016-01-28 | Discharge: 2016-01-29 | Disposition: A | Discharge status: Home / Self Care | Payer: Medicare Other | Attending: Emergency Medicine | Admitting: Emergency Medicine

## 2016-01-28 DIAGNOSIS — N3090 Cystitis, unspecified without hematuria: Secondary | ICD-10-CM | POA: Diagnosis not present

## 2016-01-28 DIAGNOSIS — Z79899 Other long term (current) drug therapy: Secondary | ICD-10-CM | POA: Diagnosis not present

## 2016-01-28 DIAGNOSIS — J45909 Unspecified asthma, uncomplicated: Secondary | ICD-10-CM | POA: Insufficient documentation

## 2016-01-28 DIAGNOSIS — Z86718 Personal history of other venous thrombosis and embolism: Secondary | ICD-10-CM | POA: Diagnosis not present

## 2016-01-28 DIAGNOSIS — M1990 Unspecified osteoarthritis, unspecified site: Secondary | ICD-10-CM | POA: Insufficient documentation

## 2016-01-28 DIAGNOSIS — Z8679 Personal history of other diseases of the circulatory system: Secondary | ICD-10-CM | POA: Diagnosis not present

## 2016-01-28 DIAGNOSIS — Z91040 Latex allergy status: Secondary | ICD-10-CM | POA: Diagnosis not present

## 2016-01-28 DIAGNOSIS — G8918 Other acute postprocedural pain: Secondary | ICD-10-CM | POA: Diagnosis not present

## 2016-01-28 DIAGNOSIS — I10 Essential (primary) hypertension: Secondary | ICD-10-CM | POA: Diagnosis not present

## 2016-01-28 DIAGNOSIS — F329 Major depressive disorder, single episode, unspecified: Secondary | ICD-10-CM | POA: Insufficient documentation

## 2016-01-28 DIAGNOSIS — R1030 Lower abdominal pain, unspecified: Secondary | ICD-10-CM | POA: Diagnosis not present

## 2016-01-28 DIAGNOSIS — R109 Unspecified abdominal pain: Secondary | ICD-10-CM | POA: Diagnosis not present

## 2016-01-28 DIAGNOSIS — Z87891 Personal history of nicotine dependence: Secondary | ICD-10-CM | POA: Diagnosis not present

## 2016-01-28 LAB — CBC WITH DIFFERENTIAL/PLATELET
Basophils Absolute: 0.3 10*3/uL — ABNORMAL HIGH (ref 0–0.1)
Basophils Relative: 4 %
Eosinophils Absolute: 0.1 10*3/uL (ref 0–0.7)
Eosinophils Relative: 1 %
HCT: 35.2 % (ref 35.0–47.0)
Hemoglobin: 11.7 g/dL — ABNORMAL LOW (ref 12.0–16.0)
Lymphocytes Relative: 18 %
Lymphs Abs: 1.3 10*3/uL (ref 1.0–3.6)
MCH: 28.4 pg (ref 26.0–34.0)
MCHC: 33.3 g/dL (ref 32.0–36.0)
MCV: 85.4 fL (ref 80.0–100.0)
Monocytes Absolute: 0.9 10*3/uL (ref 0.2–0.9)
Monocytes Relative: 13 %
Neutro Abs: 4.7 10*3/uL (ref 1.4–6.5)
Neutrophils Relative %: 64 %
Platelets: 329 10*3/uL (ref 150–440)
RBC: 4.12 MIL/uL (ref 3.80–5.20)
RDW: 16.1 % — ABNORMAL HIGH (ref 11.5–14.5)
WBC: 7.3 10*3/uL (ref 3.6–11.0)

## 2016-01-28 LAB — URINALYSIS COMPLETE WITH MICROSCOPIC (ARMC ONLY)
Bilirubin Urine: NEGATIVE
Glucose, UA: NEGATIVE mg/dL
Hgb urine dipstick: NEGATIVE
Ketones, ur: NEGATIVE mg/dL
Nitrite: POSITIVE — AB
Protein, ur: NEGATIVE mg/dL
Specific Gravity, Urine: 1.018 (ref 1.005–1.030)
pH: 5 (ref 5.0–8.0)

## 2016-01-28 LAB — COMPREHENSIVE METABOLIC PANEL
ALT: 16 U/L (ref 14–54)
AST: 17 U/L (ref 15–41)
Albumin: 3.6 g/dL (ref 3.5–5.0)
Alkaline Phosphatase: 115 U/L (ref 38–126)
Anion gap: 9 (ref 5–15)
BUN: 18 mg/dL (ref 6–20)
CO2: 26 mmol/L (ref 22–32)
Calcium: 9.2 mg/dL (ref 8.9–10.3)
Chloride: 103 mmol/L (ref 101–111)
Creatinine, Ser: 1.09 mg/dL — ABNORMAL HIGH (ref 0.44–1.00)
GFR calc Af Amer: 58 mL/min — ABNORMAL LOW (ref >60)
GFR calc non Af Amer: 50 mL/min — ABNORMAL LOW (ref >60)
Glucose, Bld: 117 mg/dL — ABNORMAL HIGH (ref 65–99)
Potassium: 3.6 mmol/L (ref 3.5–5.1)
Sodium: 138 mmol/L (ref 135–145)
Total Bilirubin: 0.6 mg/dL (ref 0.3–1.2)
Total Protein: 7.4 g/dL (ref 6.5–8.1)

## 2016-01-28 MED ORDER — CIPROFLOXACIN HCL 500 MG PO TABS: 500.0000 mg | ORAL_TABLET | Freq: Two times a day (BID) | ORAL | Status: AC

## 2016-01-28 MED ORDER — PHENAZOPYRIDINE HCL 200 MG PO TABS: 200.0000 mg | ORAL_TABLET | Freq: Three times a day (TID) | ORAL | Status: DC | PRN

## 2016-01-28 MED ORDER — SODIUM CHLORIDE 0.9 % IV SOLN
Freq: Once | INTRAVENOUS | Status: AC
  Administered 2016-01-28: 18:00:00 via INTRAVENOUS

## 2016-01-28 MED ORDER — HYDROMORPHONE HCL 1 MG/ML IJ SOLN
1.0000 mg | Freq: Once | INTRAMUSCULAR | Status: AC
  Administered 2016-01-28: 1 mg via INTRAVENOUS
  Filled 2016-01-28: qty 1

## 2016-01-28 MED ORDER — PHENAZOPYRIDINE HCL 200 MG PO TABS
200.0000 mg | ORAL_TABLET | Freq: Once | ORAL | Status: AC
  Administered 2016-01-28: 200 mg via ORAL
  Filled 2016-01-28: qty 1

## 2016-01-28 MED ORDER — DEXTROSE 5 % IV SOLN
1.0000 g | Freq: Once | INTRAVENOUS | Status: AC
  Administered 2016-01-28: 1 g via INTRAVENOUS
  Filled 2016-01-28: qty 10

## 2016-01-28 MED ORDER — HYDROMORPHONE HCL 2 MG PO TABS: 2.0000 mg | ORAL_TABLET | Freq: Two times a day (BID) | ORAL | Status: DC | PRN

## 2016-01-28 NOTE — ED Provider Notes (Addendum)
[facility] Emergency Department Provider Note        Time seen: ----------------------------------------- 4:41 PM on 01/28/2016 -----------------------------------------    I have reviewed the triage vital signs and the nursing notes.   HISTORY  Chief Complaint No chief complaint on file.    HPI Amy [name] is a 70 y.o. female resents to ER for lower abdominal pain and burning with urination. Patient states she's had symptoms for several days, nothing makes it better or worse. She has had a history of bladder infections, note she had back surgery almost a month ago. She's been wearing an abdominal binder.Patient states her pain is limiting her mobility after the surgery.   Past Medical History  Diagnosis Date  . Anxiety   . Dysphonia     intermittent "voice changes"  . Chronic back pain     lumbar steroid injection's  . Seasonal allergies   . Fibromyalgia   . Weakness     both hands and feet  . Urinary frequency   . Glaucoma   . History of MRSA infection 2011  . History of DVT (deep vein thrombosis) 1989    LEFT UPPER ARM  . Hypertension   . Short of breath on exertion   . Depression   . GERD (gastroesophageal reflux disease)   . Pelvic pain in female   . Arthritis     ddd- RA  . Irregular heart rate     "years ago"  . Peripheral vascular disease ([place])     "poor circulation"  . Mild obstructive sleep apnea     per study 02-07-2006 - no cpap  . Asthma     "sleeping asthma"  . History of hiatal hernia   . Headache   . Neuropathy (HCC)     feet  . Low iron   . Constipation     Patient Active Problem List   Diagnosis Date Noted  . 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  . Neuropathic pain of both legs 10/13/2015  . Constipation 10/13/2015  . Complete rotator cuff tear of left shoulder 05/05/2014  . Depression 08/06/2013  . Hypokalemia 08/06/2013  . GERD (gastroesophageal reflux disease) 08/06/2013  . Spinal  stenosis of lumbar region 08/06/2013  . Bladder spasm 08/06/2013  . Arthritis 08/06/2013  . Obesity, Class III, BMI 40-49.9 (morbid obesity) ([place]) 02/03/2013  . Lipoma of buttock s/p excision 02/20/2013 02/03/2013  . DYSPNEA 01/23/2008  . COUGH 01/23/2008  . OBSTRUCTIVE SLEEP APNEA 09/12/2007  . Essential hypertension 09/12/2007  . ALLERGIC RHINITIS 09/12/2007  . SLEEPINESS 09/12/2007  . ANGINA, HX OF 09/12/2007    Past Surgical History  Procedure Laterality Date  . Abdominal hysterectomy  1978  . Nose surgery  2007  . Mass excision Left 02/20/2013    Procedure: EXCISION LEFT BUTTOCK  MASS;  Surgeon: Adin Hector, MD;  Location: [facility];  Service: General;  Laterality: Left;  . Esophagogastroduodenoscopy N/A 07/15/2013    Procedure: ESOPHAGOGASTRODUODENOSCOPY (EGD);  Surgeon: Winfield Cunas., MD;  Location: [facility];  Service: Endoscopy;  Laterality: N/A;  need xray  . Abdominal adhesions removed    . Colonoscopy    . Bunionectomy Left 2008  . Shoulder open rotator cuff repair Left 05/05/2014    Procedure: OPEN ACROMIONECTOMY AND OPEN REPAIR OF ROTATOR CUFF, TISSUEMEND GRAFT WITH ANCHOR ;  Surgeon: Tobi Bastos, MD;  Location: [facility];  Service: Orthopedics;  Laterality: Left;  . Esophageal manometry N/A 11/22/2014    Procedure:  ESOPHAGEAL MANOMETRY (EM);  Surgeon: Winfield Cunas., MD;  Location: [facility];  Service: Endoscopy;  Laterality: N/A;  . Lumbar fusion  2014    L4 -- L5  . Removal hardware l4-l5/  bilateral laminectomy l2 - l5 and fusion  06-12-2011  . Laparoscopic cholecystectomy  01-11-2006  . Cysto with hydrodistension N/A 07/12/2015    Procedure: CYSTOSCOPY/HYDRODISTENSION;  Surgeon: Bjorn Loser, MD;  Location[facility];  Service: Urology;  Laterality: N/A;  . Eye surgery Bilateral     cataract surgery with lens implants  . Tonsillectomy    . Hardware removal N/A 01/02/2016    Procedure: Exploration of Lumbar Fusion,Removal of  hardware Lumbar One-Two ;Redo Posterior Lumbar Fusion Lumbar One-Two;  Surgeon: Kary Kos, MD;  Location: [facility];  Service: Neurosurgery;  Laterality: N/A;    Allergies Latex and Penicillins  Social History Social History  Substance Use Topics  . Smoking status: Former Smoker    Quit date: 07/10/1969  . Smokeless tobacco: Never Used  . Alcohol Use: Yes     Comment: rare    Review of Systems Constitutional: Negative for fever. Eyes: Negative for visual changes. ENT: Negative for sore throat. Cardiovascular: Negative for chest pain. Respiratory: Negative for shortness of breath. Gastrointestinal: Positive for lower abdominal pain Genitourinary: Positive for dysuria Musculoskeletal: Positive for back pain Skin: Negative for rash. Neurological: Negative for headaches, focal weakness or numbness. Positive for difficulty with ambulation  10-point ROS otherwise negative.  ____________________________________________   PHYSICAL EXAM:  VITAL SIGNS: ED Triage Vitals  Enc Vitals Group     BP --      Pulse --      Resp --      Temp --      Temp src --      SpO2 --      Weight --      Height --      Head Cir --      Peak Flow --      Pain Score --      Pain Loc --      Pain Edu? --      Excl. in [place]? --     Constitutional: Alert and oriented. Well appearing and in no distress. Eyes: Conjunctivae are normal. PERRL. Normal extraocular movements. ENT   Head: Normocephalic and atraumatic.   Nose: No congestion/rhinnorhea.   Mouth/Throat: Mucous membranes are moist.   Neck: No stridor. Cardiovascular: Normal rate, regular rhythm. No murmurs, rubs, or gallops. Respiratory: Normal respiratory effort without tachypnea nor retractions. Breath sounds are clear and equal bilaterally. No wheezes/rales/rhonchi. Gastrointestinal: Mild suprapubic tenderness, no rebound or guarding. Normal bowel sounds. Musculoskeletal: Nontender with normal range of motion in all  extremities. No lower extremity tenderness nor edema. Neurologic:  Normal speech and language. No gross focal neurologic deficits are appreciated.  Skin:  Skin is warm, dry and intact. No rash noted. Psychiatric: Mood and affect are normal. Speech and behavior are normal.  ____________________________________________  ED COURSE:  Pertinent labs & imaging results that were available during my care of the patient were reviewed by me and considered in my medical decision making (see chart for details). Patient resents to ER with dysuria and diffuse abdominal pain. I will check basic labs and reevaluate. ____________________________________________    LABS (pertinent positives/negatives)  Labs Reviewed  CBC WITH DIFFERENTIAL/PLATELET - Abnormal; Notable for the following:    Hemoglobin 11.7 (*)    RDW 16.1 (*)  Basophils Absolute 0.3 (*)    All other components within normal limits  COMPREHENSIVE METABOLIC PANEL - Abnormal; Notable for the following:    Glucose, Bld 117 (*)    Creatinine, Ser 1.09 (*)    GFR calc non Af Amer 50 (*)    GFR calc Af Amer 58 (*)    All other components within normal limits  URINALYSIS COMPLETEWITH MICROSCOPIC ([facility] ONLY) - Abnormal; Notable for the following:    Color, Urine YELLOW (*)    APPearance CLOUDY (*)    Nitrite POSITIVE (*)    Leukocytes, UA 3+ (*)    Bacteria, UA RARE (*)    Squamous Epithelial / LPF 0-5 (*)    All other components within normal limits  URINE CULTURE    RADIOLOGY Images were viewed by me  CT renal protocol  IMPRESSION: No evidence of acute abnormality within the abdomen or pelvis.  Postoperative changes within the lumbar spine as described. Consider further evaluation if there is strong clinical history for infection.  Cardiomegaly and aortic atherosclerosis.  ____________________________________________  FINAL ASSESSMENT AND PLAN  Cystitis, postoperative pain  Plan: Patient with labs and imaging as  dictated above. Patient is in no acute distress,  we have given IV Rocephin and sent a urine culture. She'll be discharged on Cipro for UTI coverage. I will also give Dilaudid for increased pain needs postoperatively. Advised taking a stool softener for this and will also give Pyridium.Patient has been unsure if she can go home or not. She is able to ambulate very slowly but describes increased back pain. This is been going on for the last 3 days. We have provided increased pain medication for her.   Earleen Newport, MD   Note: This dictation was prepared with Dragon dictation. Any transcriptional errors that result from this process are unintentional   Earleen Newport, MD 01/28/16 1918  Earleen Newport, MD 01/28/16 2126

## 2016-01-28 NOTE — ED Notes (Signed)
Pt presents to ED via EMS from home c/o 10/10 BLQ abd pain radaiting to groin starting Thursday and worsening progressively since. C/o dysuria and urinary frequency and urgency. Reports fevers at home. Afebrile on arrival. VS per EMS BP164/80, P88, R18, CBG 134.

## 2016-01-28 NOTE — Discharge Instructions (Signed)
Pain Medicine Instructions °HOW CAN PAIN MEDICINE AFFECT ME? °You were given a prescription for pain medicine. This medicine may make you tired or drowsy and may affect your ability to think clearly. Pain medicine may also affect your ability to drive or perform certain physical activities. It may not be possible to make all of your pain go away, but you should be comfortable enough to move, breathe, and take care of yourself. °HOW OFTEN SHOULD I TAKE PAIN MEDICINE AND HOW MUCH SHOULD I TAKE? °Take pain medicine only as directed by your health care provider and only as needed for pain. °You do not need to take pain medicine if you are not having pain, unless directed by your health care provider. °You can take less than the prescribed dose if you find that a smaller amount of medicine controls your pain. °WHAT RESTRICTIONS DO I HAVE WHILE TAKING PAIN MEDICINE? °Follow these instructions after you start taking pain medicine, while you are taking the medicine, and for 8 hours after you stop taking the medicine: °Do not drive. °Do not operate machinery. °Do not operate power tools. °Do not sign legal documents. °Do not drink alcohol. °Do not take sleeping pills. °Do not supervise children by yourself. °Do not participate in activities that require climbing or being in high places. °Do not enter a body of water--such as a lake, river, ocean, spa, or swimming pool--without an adult nearby who can monitor and help you. °HOW CAN I KEEP OTHERS SAFE WHILE I AM TAKING PAIN MEDICINE? °Store your pain medicine as directed by your health care provider. Make sure that it is placed where children and pets cannot reach it. °Never share your pain medicine with anyone. °Do not save any leftover pills. If you have any leftover pain medicine, get rid of it or destroy it as directed by your health care provider. °WHAT ELSE DO I NEED TO KNOW ABOUT TAKING PAIN MEDICINE? °Use a stool softener if you become constipated from your pain  medicine. Increasing your intake of fruits and vegetables will also help with constipation. °Write down the times when you take your pain medicine. Look at the times before you take your next dose of medicine. It is easy to become confused while on pain medicine. Recording the times helps you to avoid an overdose. °If your pain is severe, do not try to treat it yourself by taking more pills than instructed on your prescription. Contact your health care provider for help. °You may have been prescribed a pain medicine that contains acetaminophen. Do not take any other acetaminophen while taking this medicine. An overdose of acetaminophen can result in severe liver damage. Acetaminophen is found in many over-the-counter (OTC) and prescription medicines. If you are taking any medicines in addition to your pain medicine, check the active ingredients on those medicines to see if acetaminophen is listed. °WHEN SHOULD I CALL MY HEALTH CARE PROVIDER? °Your medicine is not helping to make the pain go away. °You vomit or have diarrhea shortly after taking the medicine. °You develop new pain in areas that did not hurt before. °You have an allergic reaction to your medicine. This may include: °Itchiness. °Swelling. °Dizziness. °Developing a new rash. °WHEN SHOULD I CALL 911 OR GO TO THE EMERGENCY ROOM? °You feel dizzy or you faint. °You are very confused or disoriented. °You repeatedly vomit. °Your skin or lips turn pale or bluish in color. °You have shortness of breath or you are breathing much more slowly than usual. °You have   You repeatedly vomit.  Your skin or lips turn pale or bluish in color.  You have shortness of breath or you are breathing much more slowly than usual.  You have a severe allergic reaction to your medicine. This includes:  Developing tongue swelling.  Having difficulty breathing.   This information is not intended to replace advice given to you by your health care provider. Make sure you discuss any questions you have with your health care provider.   Document Released: 12/17/2000 Document  Revised: 01/25/2015 Document Reviewed: 07/15/2014 Elsevier Interactive Patient Education 2016 [employer]  Urinary Tract Infection Urinary tract infections (UTIs) can develop anywhere along your urinary tract. Your urinary tract is your body's drainage system for removing wastes and extra water. Your urinary tract includes two kidneys, two ureters, a bladder, and a urethra. Your kidneys are a pair of bean-shaped organs. Each kidney is about the size of your fist. They are located below your ribs, one on each side of your spine. CAUSES Infections are caused by microbes, which are microscopic organisms, including fungi, viruses, and bacteria. These organisms are so small that they can only be seen through a microscope. Bacteria are the microbes that most commonly cause UTIs. SYMPTOMS  Symptoms of UTIs may vary by age and gender of the patient and by the location of the infection. Symptoms in young women typically include a frequent and intense urge to urinate and a painful, burning feeling in the bladder or urethra during urination. Older women and men are more likely to be tired, shaky, and weak and have muscle aches and abdominal pain. A fever may mean the infection is in your kidneys. Other symptoms of a kidney infection include pain in your back or sides below the ribs, nausea, and vomiting. DIAGNOSIS To diagnose a UTI, your caregiver will ask you about your symptoms. Your caregiver will also ask you to provide a urine sample. The urine sample will be tested for bacteria and white blood cells. White blood cells are made by your body to help fight infection. TREATMENT  Typically, UTIs can be treated with medication. Because most UTIs are caused by a bacterial infection, they usually can be treated with the use of antibiotics. The choice of antibiotic and length of treatment depend on your symptoms and the type of bacteria causing your infection. HOME CARE INSTRUCTIONS  If you were prescribed  antibiotics, take them exactly as your caregiver instructs you. Finish the medication even if you feel better after you have only taken some of the medication.  Drink enough water and fluids to keep your urine clear or pale yellow.  Avoid caffeine, tea, and carbonated beverages. They tend to irritate your bladder.  Empty your bladder often. Avoid holding urine for long periods of time.  Empty your bladder before and after sexual intercourse.  After a bowel movement, women should cleanse from front to back. Use each tissue only once. SEEK MEDICAL CARE IF:   You have back pain.  You develop a fever.  Your symptoms do not begin to resolve within 3 days. SEEK IMMEDIATE MEDICAL CARE IF:   You have severe back pain or lower abdominal pain.  You develop chills.  You have nausea or vomiting.  You have continued burning or discomfort with urination. MAKE SURE YOU:   Understand these instructions.  Will watch your condition.  Will get help right away if you are not doing well or get worse.   This information is not intended to replace advice  given to you by your health care provider. Make sure you discuss any questions you have with your health care provider.   Document Released: 06/20/2005 Document Revised: 06/01/2015 Document Reviewed: 10/19/2011 Elsevier Interactive Patient Education [employer].

## 2016-01-30 ENCOUNTER — Emergency Department: Payer: Medicare Other

## 2016-01-30 ENCOUNTER — Observation Stay
Admission: EM | Admit: 2016-01-30 | Discharge: 2016-02-01 | Disposition: A | Discharge status: Home / Self Care | Payer: Medicare Other | Attending: Internal Medicine | Admitting: Internal Medicine

## 2016-01-30 ENCOUNTER — Encounter: Payer: Self-pay | Admitting: *Deleted

## 2016-01-30 DIAGNOSIS — R918 Other nonspecific abnormal finding of lung field: Secondary | ICD-10-CM | POA: Insufficient documentation

## 2016-01-30 DIAGNOSIS — G8929 Other chronic pain: Secondary | ICD-10-CM | POA: Insufficient documentation

## 2016-01-30 DIAGNOSIS — I10 Essential (primary) hypertension: Secondary | ICD-10-CM | POA: Diagnosis present

## 2016-01-30 DIAGNOSIS — M545 Low back pain: Secondary | ICD-10-CM | POA: Insufficient documentation

## 2016-01-30 DIAGNOSIS — F329 Major depressive disorder, single episode, unspecified: Secondary | ICD-10-CM | POA: Diagnosis not present

## 2016-01-30 DIAGNOSIS — Z87891 Personal history of nicotine dependence: Secondary | ICD-10-CM | POA: Diagnosis not present

## 2016-01-30 DIAGNOSIS — R52 Pain, unspecified: Secondary | ICD-10-CM | POA: Diagnosis present

## 2016-01-30 DIAGNOSIS — F419 Anxiety disorder, unspecified: Secondary | ICD-10-CM | POA: Insufficient documentation

## 2016-01-30 DIAGNOSIS — J302 Other seasonal allergic rhinitis: Secondary | ICD-10-CM | POA: Diagnosis not present

## 2016-01-30 DIAGNOSIS — N390 Urinary tract infection, site not specified: Secondary | ICD-10-CM | POA: Diagnosis present

## 2016-01-30 DIAGNOSIS — Z90710 Acquired absence of both cervix and uterus: Secondary | ICD-10-CM | POA: Diagnosis not present

## 2016-01-30 DIAGNOSIS — G629 Polyneuropathy, unspecified: Secondary | ICD-10-CM | POA: Diagnosis not present

## 2016-01-30 DIAGNOSIS — M797 Fibromyalgia: Secondary | ICD-10-CM | POA: Diagnosis not present

## 2016-01-30 DIAGNOSIS — G4733 Obstructive sleep apnea (adult) (pediatric): Secondary | ICD-10-CM | POA: Diagnosis not present

## 2016-01-30 DIAGNOSIS — E876 Hypokalemia: Secondary | ICD-10-CM | POA: Insufficient documentation

## 2016-01-30 DIAGNOSIS — M79604 Pain in right leg: Secondary | ICD-10-CM | POA: Diagnosis not present

## 2016-01-30 DIAGNOSIS — Z9049 Acquired absence of other specified parts of digestive tract: Secondary | ICD-10-CM | POA: Insufficient documentation

## 2016-01-30 DIAGNOSIS — I739 Peripheral vascular disease, unspecified: Secondary | ICD-10-CM | POA: Insufficient documentation

## 2016-01-30 DIAGNOSIS — Z91040 Latex allergy status: Secondary | ICD-10-CM | POA: Diagnosis not present

## 2016-01-30 DIAGNOSIS — R112 Nausea with vomiting, unspecified: Secondary | ICD-10-CM | POA: Insufficient documentation

## 2016-01-30 DIAGNOSIS — Z86718 Personal history of other venous thrombosis and embolism: Secondary | ICD-10-CM | POA: Diagnosis not present

## 2016-01-30 DIAGNOSIS — M069 Rheumatoid arthritis, unspecified: Secondary | ICD-10-CM | POA: Diagnosis not present

## 2016-01-30 DIAGNOSIS — Z79899 Other long term (current) drug therapy: Secondary | ICD-10-CM | POA: Diagnosis not present

## 2016-01-30 DIAGNOSIS — R109 Unspecified abdominal pain: Secondary | ICD-10-CM | POA: Diagnosis not present

## 2016-01-30 DIAGNOSIS — Z981 Arthrodesis status: Secondary | ICD-10-CM | POA: Diagnosis not present

## 2016-01-30 DIAGNOSIS — Z66 Do not resuscitate: Secondary | ICD-10-CM | POA: Insufficient documentation

## 2016-01-30 DIAGNOSIS — M549 Dorsalgia, unspecified: Secondary | ICD-10-CM | POA: Insufficient documentation

## 2016-01-30 DIAGNOSIS — H409 Unspecified glaucoma: Secondary | ICD-10-CM | POA: Insufficient documentation

## 2016-01-30 DIAGNOSIS — R102 Pelvic and perineal pain: Secondary | ICD-10-CM | POA: Insufficient documentation

## 2016-01-30 DIAGNOSIS — R911 Solitary pulmonary nodule: Secondary | ICD-10-CM | POA: Diagnosis not present

## 2016-01-30 DIAGNOSIS — F32A Depression, unspecified: Secondary | ICD-10-CM | POA: Diagnosis present

## 2016-01-30 DIAGNOSIS — Z880 Allergy status to penicillin: Secondary | ICD-10-CM | POA: Diagnosis not present

## 2016-01-30 DIAGNOSIS — Z8614 Personal history of Methicillin resistant Staphylococcus aureus infection: Secondary | ICD-10-CM | POA: Insufficient documentation

## 2016-01-30 DIAGNOSIS — M79605 Pain in left leg: Secondary | ICD-10-CM | POA: Diagnosis not present

## 2016-01-30 DIAGNOSIS — K219 Gastro-esophageal reflux disease without esophagitis: Secondary | ICD-10-CM | POA: Diagnosis present

## 2016-01-30 DIAGNOSIS — K449 Diaphragmatic hernia without obstruction or gangrene: Secondary | ICD-10-CM | POA: Diagnosis not present

## 2016-01-30 LAB — COMPREHENSIVE METABOLIC PANEL
ALT: 13 U/L — ABNORMAL LOW (ref 14–54)
AST: 17 U/L (ref 15–41)
Albumin: 3.6 g/dL (ref 3.5–5.0)
Alkaline Phosphatase: 115 U/L (ref 38–126)
Anion gap: 9 (ref 5–15)
BUN: 12 mg/dL (ref 6–20)
CO2: 28 mmol/L (ref 22–32)
Calcium: 9.2 mg/dL (ref 8.9–10.3)
Chloride: 98 mmol/L — ABNORMAL LOW (ref 101–111)
Creatinine, Ser: 0.93 mg/dL (ref 0.44–1.00)
GFR calc Af Amer: >60 mL/min (ref >60)
GFR calc non Af Amer: >60 mL/min (ref >60)
Glucose, Bld: 123 mg/dL — ABNORMAL HIGH (ref 65–99)
Potassium: 3.1 mmol/L — ABNORMAL LOW (ref 3.5–5.1)
Sodium: 135 mmol/L (ref 135–145)
Total Bilirubin: 0.7 mg/dL (ref 0.3–1.2)
Total Protein: 7.6 g/dL (ref 6.5–8.1)

## 2016-01-30 LAB — URINE CULTURE
Culture: >=100000 — AB
Special Requests: NORMAL

## 2016-01-30 LAB — CBC
HCT: 34.3 % — ABNORMAL LOW (ref 35.0–47.0)
Hemoglobin: 11.2 g/dL — ABNORMAL LOW (ref 12.0–16.0)
MCH: 28.3 pg (ref 26.0–34.0)
MCHC: 32.7 g/dL (ref 32.0–36.0)
MCV: 86.4 fL (ref 80.0–100.0)
Platelets: 312 10*3/uL (ref 150–440)
RBC: 3.97 MIL/uL (ref 3.80–5.20)
RDW: 15.6 % — ABNORMAL HIGH (ref 11.5–14.5)
WBC: 5.9 10*3/uL (ref 3.6–11.0)

## 2016-01-30 LAB — URINALYSIS COMPLETE WITH MICROSCOPIC (ARMC ONLY)
Bilirubin Urine: NEGATIVE
Glucose, UA: NEGATIVE mg/dL
Hgb urine dipstick: NEGATIVE
Ketones, ur: NEGATIVE mg/dL
Leukocytes, UA: NEGATIVE
Nitrite: POSITIVE — AB
Protein, ur: NEGATIVE mg/dL
RBC / HPF: NONE SEEN RBC/hpf (ref 0–5)
Specific Gravity, Urine: 1.005 (ref 1.005–1.030)
pH: 5 (ref 5.0–8.0)

## 2016-01-30 LAB — LIPASE, BLOOD: Lipase: 28 U/L (ref 11–51)

## 2016-01-30 LAB — TROPONIN I: Troponin I: 0.03 ng/mL

## 2016-01-30 MED ORDER — HYDROMORPHONE HCL 1 MG/ML IJ SOLN
1.0000 mg | INTRAMUSCULAR | Status: AC
  Administered 2016-01-30: 1 mg via INTRAVENOUS
  Filled 2016-01-30: qty 1

## 2016-01-30 MED ORDER — ONDANSETRON HCL 4 MG/2ML IJ SOLN
4.0000 mg | Freq: Once | INTRAMUSCULAR | Status: AC
  Administered 2016-01-30: 4 mg via INTRAVENOUS
  Filled 2016-01-30: qty 2

## 2016-01-30 MED ORDER — IOPAMIDOL (ISOVUE-370) INJECTION 76%
100.0000 mL | Freq: Once | INTRAVENOUS | Status: AC | PRN
  Administered 2016-01-30: 100 mL via INTRAVENOUS

## 2016-01-30 NOTE — ED Notes (Signed)
Pt arrived to ED from home reporting continued pelvic and lower abd pain. Pt was seen in ED two dxays ago and dx with a bladder infection. Pt reports she has been taking antibiotics and dilauded as prescribed but has not had much relief from pain. Pt denies having NVD or fevers and reports she has been able to urinate throughout the past two days. Pt is alert and oriented upon arrival.

## 2016-01-30 NOTE — ED Provider Notes (Signed)
[facility] Emergency Department Provider Note  ____________________________________________  Time seen: Approximately 9:11 PM  I have reviewed the triage vital signs and the nursing notes.   HISTORY  Chief Complaint Hip Pain and Abdominal Pain    HPI Amy [name] is a 70 y.o. female a history of chronic back pain, recent lumbar rate vision, urinary frequency.  Patient presents states she is continuing to have ongoing pain in the lower abdomen and pelvis since Thursday. She reports a severe pain that shoots across lower pelvis into both thighs bilaterally. Associated with dysuria, however that has resolved. Current report is that of severe ongoing pain. She has been taking oral Percocet as well as hydromorphone at home with no improvement in her pain. She stopped taking Percocet is now currently only using hydromorphone reports using without any relief at all.  In addition she is also taking medication like Azo with no relief.  Patient reports no ongoing issues in her lower back and the pain does not seem to radiate from the back but does radiate down both buttock into the thighs and across the groin bilateral. No new numbness tingling or weakness. No chest pain or shortness of breath. No swelling in her legs.   Past Medical History  Diagnosis Date  . Anxiety   . Dysphonia     intermittent "voice changes"  . Chronic back pain     lumbar steroid injection's  . Seasonal allergies   . Fibromyalgia   . Weakness     both hands and feet  . Urinary frequency   . Glaucoma   . History of MRSA infection 2011  . History of DVT (deep vein thrombosis) 1989    LEFT UPPER ARM  . Hypertension   . Short of breath on exertion   . Depression   . GERD (gastroesophageal reflux disease)   . Pelvic pain in female   . Arthritis     ddd- RA  . Irregular heart rate     "years ago"  . Peripheral vascular disease ([place])     "poor circulation"  . Mild  obstructive sleep apnea     per study 02-07-2006 - no cpap  . Asthma     "sleeping asthma"  . History of hiatal hernia   . Headache   . Neuropathy (HCC)     feet  . Low iron   . Constipation     Patient Active Problem List   Diagnosis Date Noted  . 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  . Neuropathic pain of both legs 10/13/2015  . Constipation 10/13/2015  . Complete rotator cuff tear of left shoulder 05/05/2014  . Depression 08/06/2013  . Hypokalemia 08/06/2013  . GERD (gastroesophageal reflux disease) 08/06/2013  . Spinal stenosis of lumbar region 08/06/2013  . Bladder spasm 08/06/2013  . Arthritis 08/06/2013  . Obesity, Class III, BMI 40-49.9 (morbid obesity) ([place]) 02/03/2013  . Lipoma of buttock s/p excision 02/20/2013 02/03/2013  . DYSPNEA 01/23/2008  . COUGH 01/23/2008  . OBSTRUCTIVE SLEEP APNEA 09/12/2007  . Essential hypertension 09/12/2007  . ALLERGIC RHINITIS 09/12/2007  . SLEEPINESS 09/12/2007  . ANGINA, HX OF 09/12/2007    Past Surgical History  Procedure Laterality Date  . Abdominal hysterectomy  1978  . Nose surgery  2007  . Mass excision Left 02/20/2013    Procedure: EXCISION LEFT BUTTOCK  MASS;  Surgeon: Adin Hector, MD;  Location: [facility];  Service: General;  Laterality: Left;  . Esophagogastroduodenoscopy N/A 07/15/2013  Procedure: ESOPHAGOGASTRODUODENOSCOPY (EGD);  Surgeon: Winfield Cunas., MD;  Location: [facility];  Service: Endoscopy;  Laterality: N/A;  need xray  . Abdominal adhesions removed    . Colonoscopy    . Bunionectomy Left 2008  . Shoulder open rotator cuff repair Left 05/05/2014    Procedure: OPEN ACROMIONECTOMY AND OPEN REPAIR OF ROTATOR CUFF, TISSUEMEND GRAFT WITH ANCHOR ;  Surgeon: Tobi Bastos, MD;  Location: [facility];  Service: Orthopedics;  Laterality: Left;  . Esophageal manometry N/A 11/22/2014    Procedure: ESOPHAGEAL MANOMETRY (EM);  Surgeon: Winfield Cunas., MD;  Location: [facility];  Service: Endoscopy;   Laterality: N/A;  . Lumbar fusion  2014    L4 -- L5  . Removal hardware l4-l5/  bilateral laminectomy l2 - l5 and fusion  06-12-2011  . Laparoscopic cholecystectomy  01-11-2006  . Cysto with hydrodistension N/A 07/12/2015    Procedure: CYSTOSCOPY/HYDRODISTENSION;  Surgeon: Bjorn Loser, MD;  Location: [facility];  Service: Urology;  Laterality: N/A;  . Eye surgery Bilateral     cataract surgery with lens implants  . Tonsillectomy    . Hardware removal N/A 01/02/2016    Procedure: Exploration of Lumbar Fusion,Removal of hardware Lumbar One-Two ;Redo Posterior Lumbar Fusion Lumbar One-Two;  Surgeon: Kary Kos, MD;  Location: [place] [facility];  Service: Neurosurgery;  Laterality: N/A;    Current Outpatient Rx  Name  Route  Sig  Dispense  Refill  . azelaic acid (AZELEX) 20 % cream   Topical   Apply 1 application topically 2 (two) times daily. After skin is thoroughly washed and patted dry, gently but thoroughly massage a thin film of azelaic acid cream into the affected area twice daily, in the morning and evening.         . B Complex Vitamins (VITAMIN B COMPLEX PO)   Oral   Take 1 tablet by mouth daily.         . Calcium Carbonate-Vitamin D (CALCIUM-VITAMIN D3 PO)   Oral   Take 1 tablet by mouth daily.         . ciprofloxacin (CIPRO) 500 MG tablet   Oral   Take 1 tablet (500 mg total) by mouth 2 (two) times daily.   20 tablet   0   . cloNIDine (CATAPRES) 0.1 MG tablet   Oral   Take 0.1 mg by mouth every 12 (twelve) hours as needed. For Systolic > 99991111         . cyclobenzaprine (FLEXERIL) 10 MG tablet   Oral   Take 1 tablet (10 mg total) by mouth 3 (three) times daily as needed for muscle spasms.   30 tablet   0   . diphenhydrAMINE (BENADRYL) 25 mg capsule   Oral   Take 25 mg by mouth 2 (two) times daily as needed for allergies.         Marland Kitchen ESTRACE VAGINAL 0.1 MG/GM vaginal cream   Vaginal   Place 1 application vaginally 2 (two) times a week.        5     Dispense as written.   . gabapentin (NEURONTIN) 300 MG capsule   Oral   Take 300 mg by mouth at bedtime.          Marland Kitchen glycopyrrolate (ROBINUL) 2 MG tablet   Oral   Take 1 mg by mouth 2 (two) times daily.          . hydrALAZINE (APRESOLINE) 25 MG tablet   Oral  Take 25 mg by mouth 3 (three) times daily.         Marland Kitchen HYDROmorphone (DILAUDID) 2 MG tablet   Oral   Take 1 tablet (2 mg total) by mouth every 12 (twelve) hours as needed for severe pain.   20 tablet   0   . L-Methylfolate-B6-B12 (METANX PO)   Oral   Take 1 tablet by mouth 2 (two) times daily.          Marland Kitchen losartan-hydrochlorothiazide (HYZAAR) 100-25 MG per tablet   Oral   Take 1 tablet by mouth every morning.          . Multiple Vitamin (MULTIVITAMIN WITH MINERALS) TABS   Oral   Take 1 tablet by mouth daily.         Marland Kitchen oxyCODONE-acetaminophen (PERCOCET/ROXICET) 5-325 MG tablet   Oral   Take 1-2 tablets by mouth every 4 (four) hours as needed for moderate pain.   60 tablet   0   . pantoprazole (PROTONIX) 40 MG tablet   Oral   Take 40 mg by mouth daily.          Marland Kitchen PARoxetine (PAXIL) 40 MG tablet   Oral   Take 40 mg by mouth daily. Reported on 01/18/2016      4   . phenazopyridine (PYRIDIUM) 200 MG tablet   Oral   Take 1 tablet (200 mg total) by mouth 3 (three) times daily as needed for pain.   20 tablet   0   . polyethylene glycol (MIRALAX / GLYCOLAX) packet   Oral   Take 17 g by mouth daily.         . potassium chloride SA (K-DUR,KLOR-CON) 20 MEQ tablet   Oral   Take 20 mEq by mouth daily.          . sennosides-docusate sodium (SENOKOT-S) 8.6-50 MG tablet   Oral   Take 2 tablets by mouth at bedtime.         . silodosin (RAPAFLO) 8 MG CAPS capsule   Oral   Take 8 mg by mouth daily as needed (urinary retention).          . vitamin B-12 (CYANOCOBALAMIN) 1000 MCG tablet   Oral   Take 1,000 mcg by mouth at bedtime.         . vitamin E (VITAMIN E) 400 UNIT  capsule   Oral   Take 400 Units by mouth daily.           Allergies Latex and Penicillins  Family History  Problem Relation Age of Onset  . Hypertension Mother   . Cancer Mother     cervix  . Kidney disease Mother   . Hypertension Sister   . Cancer Sister     polyps  . Kidney disease Brother   . Cancer Daughter     leukemia  . Hypertension Brother     Social History Social History  Substance Use Topics  . Smoking status: Former Smoker    Quit date: 07/10/1969  . Smokeless tobacco: Never Used  . Alcohol Use: Yes     Comment: rare    Review of Systems Constitutional: No fever/chills Eyes: No visual changes. ENT: No sore throat. Cardiovascular: Denies chest pain. Respiratory: Denies shortness of breath. Gastrointestinal: No nausea, no vomiting.  No diarrhea.  No constipation. Genitourinary: Negative for dysuria. Musculoskeletal: Negative for back pain, except for chronic aching, and her surgery sites are healing well. Skin: Negative for rash. Neurological: Negative for headaches,  focal weakness or numbness. States pain is so severe she cannot walk for the last 5-6 days.  10-point ROS otherwise negative.  ____________________________________________   PHYSICAL EXAM:  VITAL SIGNS: ED Triage Vitals  Enc Vitals Group     BP 01/30/16 1647 170/74 mmHg     Pulse Rate 01/30/16 1647 94     Resp 01/30/16 1647 16     Temp --      Temp src --      SpO2 01/30/16 1647 98 %     Weight 01/30/16 1647 271 lb (122.925 kg)     Height 01/30/16 1647 5' 5.5" (1.664 m)     Head Cir --      Peak Flow --      Pain Score 01/30/16 1648 10     Pain Loc --      Pain Edu? --      Excl. in [place]? --    Constitutional: Alert and oriented. Well appearing But appears uncomfortable, sitting upright supporting herself with her hands. Eyes: Conjunctivae are normal. PERRL. EOMI. Head: Atraumatic. Nose: No congestion/rhinnorhea. Mouth/Throat: Mucous membranes are moist.  Oropharynx  non-erythematous. Neck: No stridor.   Cardiovascular: Normal rate, regular rhythm. Grossly normal heart sounds.  Good peripheral circulation. Respiratory: Normal respiratory effort.  No retractions. Lungs CTAB. Gastrointestinal: Soft and nontender except some moderate discomfort suprapubically, no rebound or guarding. No distention. No abdominal bruits. No CVA tenderness. Musculoskeletal: No lower extremity tenderness nor edema.  No joint effusions. Neurologic:  Normal speech and language. No gross focal neurologic deficits are appreciated though the patient does report some numbness around the paraspinous muscles of the lumbar spine which is evidently chronic per her report. Posterior lumbar incision clean dry and intact without erythema or redness. Normal use of the lower extremities bilateral, but doesn't induce worsening of her pain. No evidence of clear neurologic deficit. Dorsalis pedis pulses normal in the lower extremity bilateral. Skin:  Skin is warm, dry and intact. No rash noted. The perineum as well as intertriginous groin spaces or examined with nurse, Nellie" and found to be normal without lesion or rash to explain patient's discomfort. Psychiatric: Mood and affect are normal. Speech and behavior are normal.  ____________________________________________   LABS (all labs ordered are listed, but only abnormal results are displayed)  Labs Reviewed  COMPREHENSIVE METABOLIC PANEL - Abnormal; Notable for the following:    Potassium 3.1 (*)    Chloride 98 (*)    Glucose, Bld 123 (*)    ALT 13 (*)    All other components within normal limits  CBC - Abnormal; Notable for the following:    Hemoglobin 11.2 (*)    HCT 34.3 (*)    RDW 15.6 (*)    All other components within normal limits  URINALYSIS COMPLETEWITH MICROSCOPIC ([facility] ONLY) - Abnormal; Notable for the following:    Color, Urine AMBER (*)    APPearance CLEAR (*)    Nitrite POSITIVE (*)    Bacteria, UA RARE (*)    Squamous  Epithelial / LPF 0-5 (*)    All other components within normal limits  LIPASE, BLOOD  TROPONIN I   ____________________________________________  EKG  Reviewed and interpreted by me at 1850 Heart rate 98 QRS 90 QTc 67 Possible old inferior infarct, no evidence of acute ischemic change Reviewed and interpreted as no acute ischemic abnormality, normal sinus rhythm ____________________________________________  RADIOLOGY   DG Abd 2 Views (Final result) Result time: 01/30/16 19:55:05   Final  result by Rad Results In Interface (01/30/16 19:55:05)   Narrative:   CLINICAL DATA: Abdominal pain with nausea and vomiting for several weeks.  EXAM: ABDOMEN - 2 VIEW  COMPARISON: None.  FINDINGS: No evidence of dilated bowel loops or free air. Residual contrast seen within the urinary tract from recent CT. Lumbar spine fusion hardware again noted.  IMPRESSION: Normal bowel gas pattern. No acute findings.   Electronically Signed By: Earle Gell M.D. On: 01/30/2016 19:55          CT CTA Abd/Pel w/cm &/or w/o cm (Final result) Result time: 01/30/16 19:15:28   Final result by Rad Results In Interface (01/30/16 19:15:28)   Narrative:   CLINICAL DATA: 70 year old female with continued pelvic and lower abdominal pain. Patient was seen in the ER 2 days ago with diagnosis of bladder infection. Clinical concern for aortic dissection.  EXAM: CTA ABDOMEN AND PELVIS wITHOUT AND WITH CONTRAST  TECHNIQUE: Multidetector CT imaging of the abdomen and pelvis was performed using the standard protocol during bolus administration of intravenous contrast. Multiplanar reconstructed images and MIPs were obtained and reviewed to evaluate the vascular anatomy.  CONTRAST: 100 cc Isovue 370  COMPARISON: CT dated 01/28/2016  FINDINGS: Minimal left lung base atelectatic changes. Focal area of linear scarring noted in the lingula. There is a 7 mm ground-glass nodular density  in the right lower lobe (series 4 image 15). Adenocarcinoma cannot be excluded. Follow up by CT is recommended in 12 months, with continued annual surveillance for a minimum of 3 years.  No intra-abdominal free air or free fluid.  Cholecystectomy. The liver, pancreas, spleen, and the right adrenal gland appear unremarkable. There is a 1.5 cm indeterminate left adrenal nodule, possibly an adenoma. The kidneys, visualized ureters, and urinary bladder appear unremarkable. The hysterectomy.  Moderate stool noted throughout the colon. There is no evidence of bowel obstruction or active inflammation. The appendix is not visualized with certainty. No inflammatory changes identified in the right lower quadrant.  There is moderate aortoiliac atherosclerotic disease. There is no evidence of aortic dissection or aneurysm. The origins of the celiac axis, SMA, IMA as well as the origins of the renal arteries appear patent. The IVC and the portal vein appear unremarkable. No portal venous gas identified. There is no adenopathy.  Midline vertical anterior pelvic wall incisional scar noted. There is multilevel postsurgical changes of the lumbar spine including intervertebral cage at L1-L2 and L3-L4. L4-L5 new L5-S1 anterior fixation plate and screws caps noted. The left pedicular screw noted at L1. There is laminectomy with bilateral posterior fixation screws at L2 and L3.  There is postsurgical changes and inflammation of the paraspinal soft tissue and musculature. There is a 2.1 x 6.9 cm complex appearing fluid collection at the operative bed extending from the posterior elements to the subcutaneous soft tissues of the lower back at the level of L1-L3. This likely represents a postsurgical seroma. An infected fluid or developing abscess is not excluded. Clinical correlation is recommended. No acute fracture identified.  Review of the MIP images confirms the above findings.  IMPRESSION: No  acute intra-abdominal pelvic pathology. Specifically there is no evidence of aortic dissection or aneurysm.  A 7 mm ground-glass nodular density at the right lung base. Adenocarcinoma cannot be excluded. Follow up by CT is recommended in 12 months, with continued annual surveillance for a minimum of 3 years.These recommendations are taken from:Recommendations for the Management of Subsolid Pulmonary Nodules Detected at CT: A Statement from the [facility] 2013; 266:1, [id number].  Postsurgical changes of lumbar spine as described with a small fluid collection Set in the surgical bed, likely postoperative seroma.   Electronically Signed By: Anner Crete M.D. On: 01/30/2016 19:15    ____________________________________________   PROCEDURES  Procedure(s) performed: None  Critical Care performed: No  ____________________________________________   INITIAL IMPRESSION / ASSESSMENT AND PLAN / ED COURSE  Pertinent labs & imaging results that were available during my care of the patient were reviewed by me and considered in my medical decision making (see chart for details).  Patient presents for evaluation of ongoing pain across lower pelvis. No clear physical exam finding to explain that she has a suprapubic tenderness. She is not complaining cardiac pulmonary acute neurologic deficit. Patient had a CT without contrast recent and treated with cipro with resolution of dysuria.  Differential diagnosis includes but is not limited to, abdominal perforation, aortic dissection, cholecystitis, appendicitis, diverticulitis, colitis, esophagitis/gastritis, kidney stone, pyelonephritis, urinary tract infection, aortic aneurysm. All are considered in decision and treatment plan. Based upon the patient's presentation and risk factors, we will obtain CT with angiography to evaluate for the possibility of dissection or acute ischemic abnormality.  Patient's pain is improving  slowly, however has required 2 doses of Dilaudid. She is on oral Dilaudid without relief at home. CT findings do not demonstrate acute abnormality, and I did discuss need for follow-up for a pulmonary nodule in one year with the patient and her family who are agreeable.  Urinalysis indicates resolution of UTI. I called and discussed with neurosurgery at [facility] Dr. Cyndy Freeze her presentation, and he feels that this is not consistent with referred discomfort needing evaluation tonight by neurosurgery. I'm in agreement.  No evidence of acute cardiac, pulmonary, or obvious intra-abdominal process. No evidence supported acute neurologic or spinal etiology either. The patient does have some history of chronic lower pain/pelvic discomfort though it's unclear if this is same.  We will give the patient additional pain medicine, and due to the severity of her pain that the patient requires a third dose of pain medicine would likely admit her for intractable pain and further evaluation under the internal medicine service. Discussed with the patient and she is agreeable with this plan. She is receiving some relief after 2 doses of Dilaudid, but continues to have at least modest ongoing discomfort, to the point she reports she is unable to ambulate previous to arrival.  Ongoing care including reevaluation of pain control discussed and assigned to Dr. Joni Fears. Likely discharge to home if pain is improved, otherwise admitted to hospital for evaluation and treatment of intractable pain if requiring third dose.  ____________________________________________   FINAL CLINICAL IMPRESSION(S) / ED DIAGNOSES  Final diagnoses:  Lung nodule  Pelvic pain in female      Delman Kitten, MD 01/30/16 2148

## 2016-01-31 DIAGNOSIS — K219 Gastro-esophageal reflux disease without esophagitis: Secondary | ICD-10-CM | POA: Diagnosis not present

## 2016-01-31 DIAGNOSIS — R52 Pain, unspecified: Secondary | ICD-10-CM | POA: Diagnosis not present

## 2016-01-31 DIAGNOSIS — R109 Unspecified abdominal pain: Secondary | ICD-10-CM | POA: Diagnosis not present

## 2016-01-31 DIAGNOSIS — I10 Essential (primary) hypertension: Secondary | ICD-10-CM | POA: Diagnosis not present

## 2016-01-31 DIAGNOSIS — N390 Urinary tract infection, site not specified: Secondary | ICD-10-CM | POA: Diagnosis not present

## 2016-01-31 LAB — CBC
HCT: 34.6 % — ABNORMAL LOW (ref 35.0–47.0)
Hemoglobin: 11.4 g/dL — ABNORMAL LOW (ref 12.0–16.0)
MCH: 28.4 pg (ref 26.0–34.0)
MCHC: 32.9 g/dL (ref 32.0–36.0)
MCV: 86.4 fL (ref 80.0–100.0)
Platelets: 220 10*3/uL (ref 150–440)
RBC: 4 MIL/uL (ref 3.80–5.20)
RDW: 16 % — ABNORMAL HIGH (ref 11.5–14.5)
WBC: 5.1 10*3/uL (ref 3.6–11.0)

## 2016-01-31 LAB — BASIC METABOLIC PANEL
Anion gap: 9 (ref 5–15)
BUN: 12 mg/dL (ref 6–20)
CO2: 29 mmol/L (ref 22–32)
Calcium: 8.9 mg/dL (ref 8.9–10.3)
Chloride: 102 mmol/L (ref 101–111)
Creatinine, Ser: 0.87 mg/dL (ref 0.44–1.00)
GFR calc Af Amer: >60 mL/min (ref >60)
GFR calc non Af Amer: >60 mL/min (ref >60)
Glucose, Bld: 130 mg/dL — ABNORMAL HIGH (ref 65–99)
Potassium: 3.4 mmol/L — ABNORMAL LOW (ref 3.5–5.1)
Sodium: 140 mmol/L (ref 135–145)

## 2016-01-31 LAB — MAGNESIUM: Magnesium: 2 mg/dL (ref 1.7–2.4)

## 2016-01-31 MED ORDER — TAMSULOSIN HCL 0.4 MG PO CAPS
0.4000 mg | ORAL_CAPSULE | Freq: Every day | ORAL | Status: DC
  Administered 2016-01-31 – 2016-02-01 (×2): 0.4 mg via ORAL
  Filled 2016-01-31 (×2): qty 1

## 2016-01-31 MED ORDER — PAROXETINE HCL 20 MG PO TABS
40.0000 mg | ORAL_TABLET | Freq: Every day | ORAL | Status: DC
Start: 2016-01-31 — End: 2016-02-01
  Administered 2016-01-31 – 2016-02-01 (×2): 40 mg via ORAL
  Filled 2016-01-31 (×2): qty 2

## 2016-01-31 MED ORDER — HYDROCHLOROTHIAZIDE 25 MG PO TABS
25.0000 mg | ORAL_TABLET | Freq: Every day | ORAL | Status: DC
  Administered 2016-01-31 – 2016-02-01 (×2): 25 mg via ORAL
  Filled 2016-01-31 (×2): qty 1

## 2016-01-31 MED ORDER — HYDROMORPHONE HCL 1 MG/ML IJ SOLN
1.0000 mg | INTRAMUSCULAR | Status: DC | PRN
  Administered 2016-01-31 – 2016-02-01 (×6): 1 mg via INTRAVENOUS
  Filled 2016-01-31 (×6): qty 1

## 2016-01-31 MED ORDER — HYDRALAZINE HCL 25 MG PO TABS
25.0000 mg | ORAL_TABLET | Freq: Three times a day (TID) | ORAL | Status: DC
Start: 2016-01-31 — End: 2016-02-01
  Administered 2016-01-31 – 2016-02-01 (×5): 25 mg via ORAL
  Filled 2016-01-31 (×5): qty 1

## 2016-01-31 MED ORDER — PANTOPRAZOLE SODIUM 40 MG PO TBEC
40.0000 mg | DELAYED_RELEASE_TABLET | Freq: Every day | ORAL | Status: DC
  Administered 2016-01-31 – 2016-02-01 (×2): 40 mg via ORAL
  Filled 2016-01-31 (×2): qty 1

## 2016-01-31 MED ORDER — SENNOSIDES-DOCUSATE SODIUM 8.6-50 MG PO TABS
2.0000 | ORAL_TABLET | Freq: Every day | ORAL | Status: DC
  Administered 2016-01-31: 2 via ORAL
  Filled 2016-01-31: qty 2

## 2016-01-31 MED ORDER — LOSARTAN POTASSIUM-HCTZ 100-25 MG PO TABS: 1.0000 | ORAL_TABLET | Freq: Every morning | ORAL | Status: DC

## 2016-01-31 MED ORDER — ACETAMINOPHEN 325 MG PO TABS: 650.0000 mg | ORAL_TABLET | Freq: Four times a day (QID) | ORAL | Status: DC | PRN

## 2016-01-31 MED ORDER — OXYCODONE-ACETAMINOPHEN 5-325 MG PO TABS: 1.0000 | ORAL_TABLET | Freq: Four times a day (QID) | ORAL | Status: DC | PRN

## 2016-01-31 MED ORDER — LOSARTAN POTASSIUM 50 MG PO TABS
100.0000 mg | ORAL_TABLET | Freq: Every day | ORAL | Status: DC
  Administered 2016-01-31 – 2016-02-01 (×2): 100 mg via ORAL
  Filled 2016-01-31 (×2): qty 2

## 2016-01-31 MED ORDER — GABAPENTIN 300 MG PO CAPS
300.0000 mg | ORAL_CAPSULE | Freq: Every day | ORAL | Status: DC
  Administered 2016-01-31: 300 mg via ORAL
  Filled 2016-01-31: qty 1

## 2016-01-31 MED ORDER — ONDANSETRON HCL 4 MG/2ML IJ SOLN: 4.0000 mg | Freq: Four times a day (QID) | INTRAMUSCULAR | Status: DC | PRN

## 2016-01-31 MED ORDER — ENOXAPARIN SODIUM 40 MG/0.4ML ~~LOC~~ SOLN
40.0000 mg | Freq: Two times a day (BID) | SUBCUTANEOUS | Status: DC
  Administered 2016-01-31 – 2016-02-01 (×3): 40 mg via SUBCUTANEOUS
  Filled 2016-01-31 (×4): qty 0.4

## 2016-01-31 MED ORDER — POTASSIUM CHLORIDE CRYS ER 20 MEQ PO TBCR
40.0000 meq | EXTENDED_RELEASE_TABLET | Freq: Once | ORAL | Status: AC
  Administered 2016-01-31: 40 meq via ORAL
  Filled 2016-01-31: qty 2

## 2016-01-31 MED ORDER — ACETAMINOPHEN 650 MG RE SUPP: 650.0000 mg | Freq: Four times a day (QID) | RECTAL | Status: DC | PRN

## 2016-01-31 MED ORDER — ONDANSETRON HCL 4 MG PO TABS: 4.0000 mg | ORAL_TABLET | Freq: Four times a day (QID) | ORAL | Status: DC | PRN

## 2016-01-31 MED ORDER — POLYETHYLENE GLYCOL 3350 17 G PO PACK
17.0000 g | PACK | Freq: Every day | ORAL | Status: DC
  Administered 2016-01-31 – 2016-02-01 (×2): 17 g via ORAL
  Filled 2016-01-31 (×2): qty 1

## 2016-01-31 MED ORDER — DIPHENHYDRAMINE HCL 25 MG PO CAPS
25.0000 mg | ORAL_CAPSULE | Freq: Four times a day (QID) | ORAL | Status: DC | PRN
Start: 2016-01-31 — End: 2016-02-01
  Administered 2016-01-31: 02:00:00 25 mg via ORAL
  Filled 2016-01-31: qty 1

## 2016-01-31 MED ORDER — DEXTROSE 5 % IV SOLN
1.0000 g | Freq: Every day | INTRAVENOUS | Status: DC
  Administered 2016-01-31 (×2): 1 g via INTRAVENOUS
  Filled 2016-01-31 (×3): qty 10

## 2016-01-31 NOTE — Care Management (Signed)
Admitted to [facility] under observation status with the diagnosis of intractable pain. Lives with husband, Lissa Merlin [phone] or [phone]) and daughter Shauna Hugh. Last seen Dr. Laurann Montana at [facility] in [place] last Thursday. Discharged from [facility] to [facility] 01/06/16. Discharged from [facility] after 2 weeks. No falls "So-so appetite." Followed by [facility] in the home last visit was last Thursday. Uses cane or rolling walker to aid in ambulation. Shower chair and stool in the home. Self feeds. Needs help with bath and dressing. Husband will transport. Shelbie Ammons RN MSN CCM Care Management [phone]

## 2016-01-31 NOTE — Progress Notes (Addendum)
[facility] [place] NAME: Amy [name]    MR#:  [id number]  DATE OF BIRTH:  1946-06-18  SUBJECTIVE:  CHIEF COMPLAINT:   Chief Complaint  Patient presents with  . Hip Pain  . Abdominal Pain    constant abdominal pain, leg pain and back pain, 8/10. Dilaudid did not work. REVIEW OF SYSTEMS:  CONSTITUTIONAL: No fever, fatigue or weakness.  EYES: No blurred or double vision.  EARS, NOSE, AND THROAT: No tinnitus or ear pain.  RESPIRATORY: No cough, shortness of breath, wheezing or hemoptysis.  CARDIOVASCULAR: No chest pain, orthopnea, edema.  GASTROINTESTINAL: No nausea, vomiting, diarrhea or abdominal pain.  GENITOURINARY: No dysuria, hematuria.  ENDOCRINE: No polyuria, nocturia,  HEMATOLOGY: No anemia, easy bruising or bleeding SKIN: No rash or lesion. MUSCULOSKELETAL: abdominal pain, leg pain and back pain. NEUROLOGIC: No tingling, numbness, weakness.  PSYCHIATRY: No anxiety or depression.   DRUG ALLERGIES:   Allergies  Allergen Reactions  . Latex Other (See Comments)    Sores, blisters  . Penicillins Rash    UNKNOWN but able to take AMPICILLIN     VITALS:  Blood pressure 115/68, pulse 89, temperature 98.1 F (36.7 C), temperature source Oral, resp. rate 16, height 5\' 5"  (1.651 m), weight 121.065 kg (266 lb 14.4 oz), SpO2 93 %.  PHYSICAL EXAMINATION:  GENERAL:  70 y.o.-year-old patient lying in the bed with no acute distress. Morbid obese. EYES: Pupils equal, round, reactive to light and accommodation. No scleral icterus. Extraocular muscles intact.  HEENT: Head atraumatic, normocephalic. Oropharynx and nasopharynx clear.  NECK:  Supple, no jugular venous distention. No thyroid enlargement, no tenderness.  LUNGS: Normal breath sounds bilaterally, no wheezing, rales,rhonchi or crepitation. No use of accessory muscles of respiration.  CARDIOVASCULAR: S1, S2 normal. No murmurs, rubs, or gallops.  ABDOMEN: Soft,  diffuse tenderness,  nondistended. Bowel sounds present. No organomegaly or mass.  EXTREMITIES: No pedal edema, cyanosis, or clubbing.  Tenderness in bilateral legs and arms. NEUROLOGIC: Cranial nerves II through XII are intact. Unable to move leg due to pain. PSYCHIATRIC: The patient is alert and oriented x 3.  SKIN: No obvious rash, lesion, or ulcer.    LABORATORY PANEL:   CBC  Recent Labs Lab 01/31/16 0456  WBC 5.1  HGB 11.4*  HCT 34.6*  PLT 220   ------------------------------------------------------------------------------------------------------------------  Chemistries   Recent Labs Lab 01/30/16 1652 01/31/16 0456  NA 135 140  K 3.1* 3.4*  CL 98* 102  CO2 28 29  GLUCOSE 123* 130*  BUN 12 12  CREATININE 0.93 0.87  CALCIUM 9.2 8.9  MG  --  2.0  AST 17  --   ALT 13*  --   ALKPHOS 115  --   BILITOT 0.7  --    ------------------------------------------------------------------------------------------------------------------  Cardiac Enzymes  Recent Labs Lab 01/30/16 1652  TROPONINI 0.03   ------------------------------------------------------------------------------------------------------------------  RADIOLOGY:  Dg Abd 2 Views  01/30/2016  CLINICAL DATA:  Abdominal pain with nausea and vomiting for several weeks. EXAM: ABDOMEN - 2 VIEW COMPARISON:  None. FINDINGS: No evidence of dilated bowel loops or free air. Residual contrast seen within the urinary tract from recent CT. Lumbar spine fusion hardware again noted. IMPRESSION: Normal bowel gas pattern.  No acute findings. Electronically Signed   By: Earle Gell M.D.   On: 01/30/2016 19:55   Ct Cta Abd/pel W/cm &/or W/o Cm  01/30/2016  CLINICAL DATA:  69 year old female with continued pelvic and lower abdominal pain. Patient  was seen in the ER 2 days ago with diagnosis of bladder infection. Clinical concern for aortic dissection. EXAM: CTA ABDOMEN AND PELVIS wITHOUT AND WITH CONTRAST TECHNIQUE: Multidetector  CT imaging of the abdomen and pelvis was performed using the standard protocol during bolus administration of intravenous contrast. Multiplanar reconstructed images and MIPs were obtained and reviewed to evaluate the vascular anatomy. CONTRAST:  100 cc Isovue 370 COMPARISON:  CT dated 01/28/2016 FINDINGS: Minimal left lung base atelectatic changes. Focal area of linear scarring noted in the lingula. There is a 7 mm ground-glass nodular density in the right lower lobe (series 4 image 15). Adenocarcinoma cannot be excluded. Follow up by CT is recommended in 12 months, with continued annual surveillance for a minimum of 3 years. No intra-abdominal free air or free fluid. Cholecystectomy. The liver, pancreas, spleen, and the right adrenal gland appear unremarkable. There is a 1.5 cm indeterminate left adrenal nodule, possibly an adenoma. The kidneys, visualized ureters, and urinary bladder appear unremarkable. The hysterectomy. Moderate stool noted throughout the colon. There is no evidence of bowel obstruction or active inflammation. The appendix is not visualized with certainty. No inflammatory changes identified in the right lower quadrant. There is moderate aortoiliac atherosclerotic disease. There is no evidence of aortic dissection or aneurysm. The origins of the celiac axis, SMA, IMA as well as the origins of the renal arteries appear patent. The IVC and the portal vein appear unremarkable. No portal venous gas identified. There is no adenopathy. Midline vertical anterior pelvic wall incisional scar noted. There is multilevel postsurgical changes of the lumbar spine including intervertebral cage at L1-L2 and L3-L4. L4-L5 new L5-S1 anterior fixation plate and screws caps noted. The left pedicular screw noted at L1. There is laminectomy with bilateral posterior fixation screws at L2 and L3. There is postsurgical changes and inflammation of the paraspinal soft tissue and musculature. There is a 2.1 x 6.9 cm complex  appearing fluid collection at the operative bed extending from the posterior elements to the subcutaneous soft tissues of the lower back at the level of L1-L3. This likely represents a postsurgical seroma. An infected fluid or developing abscess is not excluded. Clinical correlation is recommended. No acute fracture identified. Review of the MIP images confirms the above findings. IMPRESSION: No acute intra-abdominal pelvic pathology. Specifically there is no evidence of aortic dissection or aneurysm. A 7 mm ground-glass nodular density at the right lung base. Adenocarcinoma cannot be excluded. Follow up by CT is recommended in 12 months, with continued annual surveillance for a minimum of 3 years.These recommendations are taken from:Recommendations for the Management of Subsolid Pulmonary Nodules Detected at CT: A Statement from the [facility] 2013; 266:1, [phone]. Postsurgical changes of lumbar spine as described with a small fluid collection Set in the surgical bed, likely postoperative seroma. Electronically Signed   By: Anner Crete M.D.   On: 01/30/2016 19:15    EKG:   Orders placed or performed during the hospital encounter of 01/30/16  . ED EKG  . ED EKG  . EKG 12-Lead  . EKG 12-Lead    ASSESSMENT AND PLAN:   Abdominal pain, leg pain and back pain. Unclear etiology. Pain control.  UTI (lower urinary tract infection) - remains nitrite positive despite 3 days of ciprofloxacin.  continue IV Rocephin, follow up urine culture.   Essential hypertension - continue home meds  Depression - continue home meds  GERD (gastroesophageal reflux disease) - home dose PPI  * Hypokalemia. KCl, f/u K.  A 7 mm ground-glass nodular density at the right lung base. Adenocarcinoma cannot be excluded. Follow up by CT is recommended in 12 months.  All the records are reviewed and case discussed with Care Management/Social Workerr. Management plans discussed with the patient,  family and they are in agreement.  CODE STATUS: full code.  TOTAL TIME TAKING CARE OF THIS PATIENT: 36 minutes.  Greater than 50% time was spent on coordination of care and face-to-face counseling.  POSSIBLE D/C IN 1-2 DAYS, DEPENDING ON CLINICAL CONDITION.   Demetrios Loll M.D on 01/31/2016 at 1:04 PM  Between 7am to 6pm - Pager - [phone]  After 6pm go to [email or web address] - password [email or web address] [facility]  Office  [phone]  CC: Primary care physician; Osborne Casco, MD

## 2016-01-31 NOTE — Progress Notes (Signed)
Pharmacy Antibiotic Note  Amy [name] is a 70 y.o. female admitted on 01/30/2016 with UTI.  Pharmacy has been consulted for ceftriaxone dosing. Allergy to penicillins listed - rash. Patient is able to take ampicillin.   Plan: Ceftriaxone 1 gm IV Q24H  Height: 5' 5.5" (166.4 cm) Weight: 271 lb (122.925 kg) IBW/kg (Calculated) : 58.15  Temp (24hrs), Avg:98 F (36.7 C), Min:98 F (36.7 C), Max:98 F (36.7 C)   Recent Labs Lab 01/28/16 1736 01/30/16 1652  WBC 7.3 5.9  CREATININE 1.09* 0.93    Estimated Creatinine Clearance: 74.7 mL/min (by C-G formula based on Cr of 0.93).    Allergies  Allergen Reactions  . Latex Other (See Comments)    Sores, blisters  . Penicillins Rash    UNKNOWN but able to take AMPICILLIN     Thank you for allowing pharmacy to be a part of this patient's care.  Laural Benes, Pharm.D., BCPS Clinical Pharmacist 01/31/2016 1:43 AM

## 2016-01-31 NOTE — Care Management Obs Status (Signed)
[facility] NOTIFICATION   Patient Details  Name: Amy [name] MRN: [id number] Date of Birth: 04-27-1946   Medicare Observation Status Notification Given:  Yes    Shelbie Ammons, RN 01/31/2016, 10:52 AM

## 2016-01-31 NOTE — H&P (Addendum)
[facility] NAME: Amy [name]    MR#:  [id number]  DATE OF BIRTH:  May 27, 1946  DATE OF ADMISSION:  01/30/2016  PRIMARY CARE PHYSICIAN: Osborne Casco, MD   REQUESTING/REFERRING PHYSICIAN: Joni Fears, MD  CHIEF COMPLAINT:   Chief Complaint  Patient presents with  . Hip Pain  . Abdominal Pain    HISTORY OF PRESENT ILLNESS:  Amy [name]  is a 70 y.o. female who presents with abdominal pain and bilateral lower extremity pain. Patient states that 4 days ago she developed abdominal pain as well as pelvic and proximal bilateral lower extremity pain. She states that she has a significant history of low back pathology including recent revision surgery. However, she states that this pain is not the same as the pain she fell from her back. She was diagnosed with urinary tract infection around that time, and started on ciprofloxacin. Evaluation in the ED today shows UA which still has positive nitrites, despite the fact that her prior urine culture grew pansensitive Escherichia coli. It is unclear whether or not her UTI is linked to her lower extremity pain, as on further interview patient relates that her legs are actually hurting all the way down to her feet. Patient does not give any red flag symptoms such as saddle anesthesia or loss of continence. However, her pain is not well controlled despite rounds of pain meds in the ED. Hospitalists were called for admission  PAST MEDICAL HISTORY:   Past Medical History  Diagnosis Date  . Anxiety   . Dysphonia     intermittent "voice changes"  . Chronic back pain     lumbar steroid injection's  . Seasonal allergies   . Fibromyalgia   . Weakness     both hands and feet  . Urinary frequency   . Glaucoma   . History of MRSA infection 2011  . History of DVT (deep vein thrombosis) 1989    LEFT UPPER ARM  . Hypertension   . Short of breath on exertion   .  Depression   . GERD (gastroesophageal reflux disease)   . Pelvic pain in female   . Arthritis     ddd- RA  . Irregular heart rate     "years ago"  . Peripheral vascular disease [place])     "poor circulation"  . Mild obstructive sleep apnea     per study 02-07-2006 - no cpap  . Asthma     "sleeping asthma"  . History of hiatal hernia   . Headache   . Neuropathy (HCC)     feet  . Low iron   . Constipation     PAST SURGICAL HISTORY:   Past Surgical History  Procedure Laterality Date  . Abdominal hysterectomy  1978  . Nose surgery  2007  . Mass excision Left 02/20/2013    Procedure: EXCISION LEFT BUTTOCK  MASS;  Surgeon: Adin Hector, MD;  Location: [facility];  Service: General;  Laterality: Left;  . Esophagogastroduodenoscopy N/A 07/15/2013    Procedure: ESOPHAGOGASTRODUODENOSCOPY (EGD);  Surgeon: Winfield Cunas., MD;  Location: [facility];  Service: Endoscopy;  Laterality: N/A;  need xray  . Abdominal adhesions removed    . Colonoscopy    . Bunionectomy Left 2008  . Shoulder open rotator cuff repair Left 05/05/2014    Procedure: OPEN ACROMIONECTOMY AND OPEN REPAIR OF ROTATOR CUFF, TISSUEMEND GRAFT WITH ANCHOR ;  Surgeon: Tobi Bastos, MD;  Location: [facility];  Service: Orthopedics;  Laterality: Left;  . Esophageal manometry N/A 11/22/2014    Procedure: ESOPHAGEAL MANOMETRY (EM);  Surgeon: Winfield Cunas., MD;  Location: [facility];  Service: Endoscopy;  Laterality: N/A;  . Lumbar fusion  2014    L4 -- L5  . Removal hardware l4-l5/  bilateral laminectomy l2 - l5 and fusion  06-12-2011  . Laparoscopic cholecystectomy  01-11-2006  . Cysto with hydrodistension N/A 07/12/2015    Procedure: CYSTOSCOPY/HYDRODISTENSION;  Surgeon: Bjorn Loser, MD;  Location: [facility];  Service: Urology;  Laterality: N/A;  . Eye surgery Bilateral     cataract surgery with lens implants  . Tonsillectomy    . Hardware removal N/A 01/02/2016    Procedure: Exploration of  Lumbar Fusion,Removal of hardware Lumbar One-Two ;Redo Posterior Lumbar Fusion Lumbar One-Two;  Surgeon: Kary Kos, MD;  Location: [facility];  Service: Neurosurgery;  Laterality: N/A;    SOCIAL HISTORY:   Social History  Substance Use Topics  . Smoking status: Former Smoker    Quit date: 07/10/1969  . Smokeless tobacco: Never Used  . Alcohol Use: Yes     Comment: rare    FAMILY HISTORY:   Family History  Problem Relation Age of Onset  . Hypertension Mother   . Cancer Mother     cervix  . Kidney disease Mother   . Hypertension Sister   . Cancer Sister     polyps  . Kidney disease Brother   . Cancer Daughter     leukemia  . Hypertension Brother     DRUG ALLERGIES:   Allergies  Allergen Reactions  . Latex Other (See Comments)    Sores, blisters  . Penicillins Rash    UNKNOWN but able to take AMPICILLIN     MEDICATIONS AT HOME:   Prior to Admission medications   Medication Sig Start Date End Date Taking? Authorizing Provider  azelaic acid (AZELEX) 20 % cream Apply 1 application topically 2 (two) times daily. After skin is thoroughly washed and patted dry, gently but thoroughly massage a thin film of azelaic acid cream into the affected area twice daily, in the morning and evening.    Historical Provider, MD  B Complex Vitamins (VITAMIN B COMPLEX PO) Take 1 tablet by mouth daily.    Historical Provider, MD  Calcium Carbonate-Vitamin D (CALCIUM-VITAMIN D3 PO) Take 1 tablet by mouth daily.    Historical Provider, MD  ciprofloxacin (CIPRO) 500 MG tablet Take 1 tablet (500 mg total) by mouth 2 (two) times daily. 01/28/16 02/07/16  Earleen Newport, MD  cloNIDine (CATAPRES) 0.1 MG tablet Take 0.1 mg by mouth every 12 (twelve) hours as needed. For Systolic > [id number]    Historical Provider, MD  cyclobenzaprine (FLEXERIL) 10 MG tablet Take 1 tablet (10 mg total) by mouth 3 (three) times daily as needed for muscle spasms. 01/06/16   Eustace Moore, MD  diphenhydrAMINE (BENADRYL) 25  mg capsule Take 25 mg by mouth 2 (two) times daily as needed for allergies.    Historical Provider, MD  ESTRACE VAGINAL 0.1 MG/GM vaginal cream Place 1 application vaginally 2 (two) times a week.  08/16/15   Historical Provider, MD  gabapentin (NEURONTIN) 300 MG capsule Take 300 mg by mouth at bedtime.     Historical Provider, MD  glycopyrrolate (ROBINUL) 2 MG tablet Take 1 mg by mouth 2 (two) times daily.  12/30/12   Historical Provider, MD  hydrALAZINE (APRESOLINE) 25 MG tablet Take 25  mg by mouth 3 (three) times daily.    Historical Provider, MD  HYDROmorphone (DILAUDID) 2 MG tablet Take 1 tablet (2 mg total) by mouth every 12 (twelve) hours as needed for severe pain. 01/28/16 01/27/17  Earleen Newport, MD  L-Methylfolate-B6-B12 (METANX PO) Take 1 tablet by mouth 2 (two) times daily.     Historical Provider, MD  losartan-hydrochlorothiazide (HYZAAR) 100-25 MG per tablet Take 1 tablet by mouth every morning.  01/14/13   Historical Provider, MD  Multiple Vitamin (MULTIVITAMIN WITH MINERALS) TABS Take 1 tablet by mouth daily.    Historical Provider, MD  oxyCODONE-acetaminophen (PERCOCET/ROXICET) 5-325 MG tablet Take 1-2 tablets by mouth every 4 (four) hours as needed for moderate pain. 10/03/15   Kary Kos, MD  pantoprazole (PROTONIX) 40 MG tablet Take 40 mg by mouth daily.     Historical Provider, MD  PARoxetine (PAXIL) 40 MG tablet Take 40 mg by mouth daily. Reported on 01/18/2016 12/15/15   Historical Provider, MD  phenazopyridine (PYRIDIUM) 200 MG tablet Take 1 tablet (200 mg total) by mouth 3 (three) times daily as needed for pain. 01/28/16 01/27/17  Earleen Newport, MD  polyethylene glycol [facility] / Floria Raveling) packet Take 17 g by mouth daily.    Historical Provider, MD  potassium chloride SA (K-DUR,KLOR-CON) 20 MEQ tablet Take 20 mEq by mouth daily.     Historical Provider, MD  sennosides-docusate sodium (SENOKOT-S) 8.6-50 MG tablet Take 2 tablets by mouth at bedtime.    Historical Provider, MD   silodosin (RAPAFLO) 8 MG CAPS capsule Take 8 mg by mouth daily as needed (urinary retention).     Historical Provider, MD  vitamin B-12 (CYANOCOBALAMIN) 1000 MCG tablet Take 1,000 mcg by mouth at bedtime.    Historical Provider, MD  vitamin E (VITAMIN E) 400 UNIT capsule Take 400 Units by mouth daily.    Historical Provider, MD    REVIEW OF SYSTEMS:  Review of Systems  Constitutional: Negative for fever, chills, weight loss and malaise/fatigue.  HENT: Negative for ear pain, hearing loss and tinnitus.   Eyes: Negative for blurred vision, double vision, pain and redness.  Respiratory: Negative for cough, hemoptysis and shortness of breath.   Cardiovascular: Negative for chest pain, palpitations, orthopnea and leg swelling.  Gastrointestinal: Positive for nausea and abdominal pain. Negative for vomiting, diarrhea and constipation.  Genitourinary: Positive for dysuria. Negative for frequency and hematuria.  Musculoskeletal: Negative for back pain, joint pain and neck pain.  Skin:       No acne, rash, or lesions  Neurological: Negative for dizziness, tremors, focal weakness and weakness.  Endo/Heme/Allergies: Negative for polydipsia. Does not bruise/bleed easily.  Psychiatric/Behavioral: Negative for depression. The patient is not nervous/anxious and does not have insomnia.      VITAL SIGNS:   Filed Vitals:   01/30/16 2230 01/30/16 2300 01/30/16 2330 01/31/16 0007  BP: 132/67 149/78 133/67   Pulse: 93 99 94   Temp:    98 F (36.7 C)  TempSrc:    Oral  Resp: 12 24 14    Height:      Weight:      SpO2: 91% 96% 95%    Wt Readings from Last 3 Encounters:  01/30/16 122.925 kg (271 lb)  01/18/16 130.364 kg (287 lb 6.4 oz)  01/09/16 121.246 kg (267 lb 4.8 oz)    PHYSICAL EXAMINATION:  Physical Exam  Vitals reviewed. Constitutional: She is oriented to person, place, and time. She appears well-developed and well-nourished. No distress.  HENT:  Head: Normocephalic and atraumatic.   Mouth/Throat: Oropharynx is clear and moist.  Eyes: Conjunctivae and EOM are normal. Pupils are equal, round, and reactive to light. No scleral icterus.  Neck: Normal range of motion. Neck supple. No JVD present. No thyromegaly present.  Cardiovascular: Normal rate, regular rhythm and intact distal pulses.  Exam reveals no gallop and no friction rub.   No murmur heard. Respiratory: Effort normal and breath sounds normal. No respiratory distress. She has no wheezes. She has no rales.  GI: Soft. Bowel sounds are normal. She exhibits no distension. There is tenderness (low abdomen).  Musculoskeletal: Normal range of motion. She exhibits tenderness (Bilateral lower extremities). She exhibits no edema.  No arthritis, no gout  Lymphadenopathy:    She has no cervical adenopathy.  Neurological: She is alert and oriented to person, place, and time. No cranial nerve deficit.  No dysarthria, no aphasia  Skin: Skin is warm and dry. No rash noted. No erythema.  Psychiatric: She has a normal mood and affect. Her behavior is normal. Judgment and thought content normal.    LABORATORY PANEL:   CBC  Recent Labs Lab 01/30/16 1652  WBC 5.9  HGB 11.2*  HCT 34.3*  PLT 312   ------------------------------------------------------------------------------------------------------------------  Chemistries   Recent Labs Lab 01/30/16 1652  NA 135  K 3.1*  CL 98*  CO2 28  GLUCOSE 123*  BUN 12  CREATININE 0.93  CALCIUM 9.2  AST 17  ALT 13*  ALKPHOS 115  BILITOT 0.7   ------------------------------------------------------------------------------------------------------------------  Cardiac Enzymes  Recent Labs Lab 01/30/16 1652  TROPONINI 0.03   ------------------------------------------------------------------------------------------------------------------  RADIOLOGY:  Dg Abd 2 Views  01/30/2016  CLINICAL DATA:  Abdominal pain with nausea and vomiting for several weeks. EXAM: ABDOMEN  - 2 VIEW COMPARISON:  None. FINDINGS: No evidence of dilated bowel loops or free air. Residual contrast seen within the urinary tract from recent CT. Lumbar spine fusion hardware again noted. IMPRESSION: Normal bowel gas pattern.  No acute findings. Electronically Signed   By: Earle Gell M.D.   On: 01/30/2016 19:55   Ct Cta Abd/pel W/cm &/or W/o Cm  01/30/2016  CLINICAL DATA:  70 year old female with continued pelvic and lower abdominal pain. Patient was seen in the ER 2 days ago with diagnosis of bladder infection. Clinical concern for aortic dissection. EXAM: CTA ABDOMEN AND PELVIS wITHOUT AND WITH CONTRAST TECHNIQUE: Multidetector CT imaging of the abdomen and pelvis was performed using the standard protocol during bolus administration of intravenous contrast. Multiplanar reconstructed images and MIPs were obtained and reviewed to evaluate the vascular anatomy. CONTRAST:  100 cc Isovue 370 COMPARISON:  CT dated 01/28/2016 FINDINGS: Minimal left lung base atelectatic changes. Focal area of linear scarring noted in the lingula. There is a 7 mm ground-glass nodular density in the right lower lobe (series 4 image 15). Adenocarcinoma cannot be excluded. Follow up by CT is recommended in 12 months, with continued annual surveillance for a minimum of 3 years. No intra-abdominal free air or free fluid. Cholecystectomy. The liver, pancreas, spleen, and the right adrenal gland appear unremarkable. There is a 1.5 cm indeterminate left adrenal nodule, possibly an adenoma. The kidneys, visualized ureters, and urinary bladder appear unremarkable. The hysterectomy. Moderate stool noted throughout the colon. There is no evidence of bowel obstruction or active inflammation. The appendix is not visualized with certainty. No inflammatory changes identified in the right lower quadrant. There is moderate aortoiliac atherosclerotic disease. There is no evidence of aortic dissection or aneurysm. The  origins of the celiac axis, SMA,  IMA as well as the origins of the renal arteries appear patent. The IVC and the portal vein appear unremarkable. No portal venous gas identified. There is no adenopathy. Midline vertical anterior pelvic wall incisional scar noted. There is multilevel postsurgical changes of the lumbar spine including intervertebral cage at L1-L2 and L3-L4. L4-L5 new L5-S1 anterior fixation plate and screws caps noted. The left pedicular screw noted at L1. There is laminectomy with bilateral posterior fixation screws at L2 and L3. There is postsurgical changes and inflammation of the paraspinal soft tissue and musculature. There is a 2.1 x 6.9 cm complex appearing fluid collection at the operative bed extending from the posterior elements to the subcutaneous soft tissues of the lower back at the level of L1-L3. This likely represents a postsurgical seroma. An infected fluid or developing abscess is not excluded. Clinical correlation is recommended. No acute fracture identified. Review of the MIP images confirms the above findings. IMPRESSION: No acute intra-abdominal pelvic pathology. Specifically there is no evidence of aortic dissection or aneurysm. A 7 mm ground-glass nodular density at the right lung base. Adenocarcinoma cannot be excluded. Follow up by CT is recommended in 12 months, with continued annual surveillance for a minimum of 3 years.These recommendations are taken from:Recommendations for the Management of Subsolid Pulmonary Nodules Detected at CT: A Statement from the [facility] 2013; 266:1, [phone]. Postsurgical changes of lumbar spine as described with a small fluid collection Set in the surgical bed, likely postoperative seroma. Electronically Signed   By: Anner Crete M.D.   On: 01/30/2016 19:15    EKG:   Orders placed or performed during the hospital encounter of 01/30/16  . ED EKG  . ED EKG  . EKG 12-Lead  . EKG 12-Lead    IMPRESSION AND PLAN:  Principal Problem:   Intractable  pain - when necessary IV analgesia ordered. This pain is likely related in part to her UTI, but is unclear if there is not also a component related to her recent lower back spinal pathology. Treat UTI as below, however if her lower extremity symptoms do not improve with said treatment should consider perhaps reimaging her lower spine or transferring her to facility where her neurosurgeon can see her. Active Problems:   UTI (lower urinary tract infection) - remains nitrite positive despite 3 days of ciprofloxacin. We will change antibiotics to IV Rocephin here, reculture her urine, and monitor for improvement.   Essential hypertension - continue home meds   Depression - continue home meds   GERD (gastroesophageal reflux disease) - home dose PPI  All the records are reviewed and case discussed with ED provider. Management plans discussed with the patient and/or family.  DVT PROPHYLAXIS: SubQ lovenox  GI PROPHYLAXIS: PPI  ADMISSION STATUS: Observation  CODE STATUS: DNR Code Status History    Date Active Date Inactive Code Status Order ID Comments User Context   09/28/2015  4:33 PM 10/03/2015  7:29 PM Full Code [id number]  Kary Kos, MD Inpatient   05/05/2014  2:40 PM 05/06/2014  2:44 PM Full Code [id number]  Tobi Bastos, MD Inpatient   07/27/2013  4:04 PM 07/31/2013  7:51 PM Full Code [id number]  Elaina Hoops, MD Inpatient      TOTAL TIME TAKING CARE OF THIS PATIENT: 40 minutes.    WILLIS, DAVID FIELDING 01/31/2016, 12:09 AM  [facility]  Office  [phone]  CC: Primary care physician; Osborne Casco, MD

## 2016-01-31 NOTE — ED Provider Notes (Signed)
Assume care of the patient at 10:00 PM from Dr. Jacqualine Code. On reassessment, the patient is complaining of 10 out of 10 pelvic pain again. She is unable to get up and walk, and she has no family support at home due to her sister being sick and other family members also being sick and injured or living far away. We'll give further doses of Dilaudid. Case discussed with the hospitalist for further management of the severe symptoms despite her improving urinary tract infection.  Carrie Mew, MD 01/31/16 [phone]

## 2016-02-01 DIAGNOSIS — G5792 Unspecified mononeuropathy of left lower limb: Secondary | ICD-10-CM | POA: Diagnosis not present

## 2016-02-01 DIAGNOSIS — G8929 Other chronic pain: Secondary | ICD-10-CM | POA: Diagnosis not present

## 2016-02-01 DIAGNOSIS — R109 Unspecified abdominal pain: Secondary | ICD-10-CM | POA: Diagnosis not present

## 2016-02-01 DIAGNOSIS — M4806 Spinal stenosis, lumbar region: Secondary | ICD-10-CM | POA: Diagnosis not present

## 2016-02-01 DIAGNOSIS — K219 Gastro-esophageal reflux disease without esophagitis: Secondary | ICD-10-CM | POA: Diagnosis not present

## 2016-02-01 DIAGNOSIS — M79604 Pain in right leg: Secondary | ICD-10-CM | POA: Diagnosis not present

## 2016-02-01 DIAGNOSIS — E876 Hypokalemia: Secondary | ICD-10-CM | POA: Diagnosis not present

## 2016-02-01 DIAGNOSIS — N390 Urinary tract infection, site not specified: Secondary | ICD-10-CM | POA: Diagnosis not present

## 2016-02-01 DIAGNOSIS — R490 Dysphonia: Secondary | ICD-10-CM | POA: Diagnosis not present

## 2016-02-01 DIAGNOSIS — R2681 Unsteadiness on feet: Secondary | ICD-10-CM | POA: Diagnosis not present

## 2016-02-01 DIAGNOSIS — F329 Major depressive disorder, single episode, unspecified: Secondary | ICD-10-CM | POA: Diagnosis not present

## 2016-02-01 DIAGNOSIS — M545 Low back pain: Secondary | ICD-10-CM | POA: Diagnosis not present

## 2016-02-01 DIAGNOSIS — M792 Neuralgia and neuritis, unspecified: Secondary | ICD-10-CM | POA: Diagnosis not present

## 2016-02-01 DIAGNOSIS — R6889 Other general symptoms and signs: Secondary | ICD-10-CM | POA: Diagnosis not present

## 2016-02-01 DIAGNOSIS — R112 Nausea with vomiting, unspecified: Secondary | ICD-10-CM | POA: Diagnosis not present

## 2016-02-01 DIAGNOSIS — M79609 Pain in unspecified limb: Secondary | ICD-10-CM | POA: Diagnosis not present

## 2016-02-01 DIAGNOSIS — R911 Solitary pulmonary nodule: Secondary | ICD-10-CM | POA: Diagnosis not present

## 2016-02-01 DIAGNOSIS — K5901 Slow transit constipation: Secondary | ICD-10-CM | POA: Diagnosis not present

## 2016-02-01 DIAGNOSIS — I10 Essential (primary) hypertension: Secondary | ICD-10-CM | POA: Diagnosis not present

## 2016-02-01 DIAGNOSIS — M6281 Muscle weakness (generalized): Secondary | ICD-10-CM | POA: Diagnosis not present

## 2016-02-01 DIAGNOSIS — M549 Dorsalgia, unspecified: Secondary | ICD-10-CM | POA: Diagnosis not present

## 2016-02-01 DIAGNOSIS — G5791 Unspecified mononeuropathy of right lower limb: Secondary | ICD-10-CM | POA: Diagnosis not present

## 2016-02-01 DIAGNOSIS — R252 Cramp and spasm: Secondary | ICD-10-CM | POA: Diagnosis not present

## 2016-02-01 DIAGNOSIS — M4326 Fusion of spine, lumbar region: Secondary | ICD-10-CM | POA: Diagnosis not present

## 2016-02-01 DIAGNOSIS — R278 Other lack of coordination: Secondary | ICD-10-CM | POA: Diagnosis not present

## 2016-02-01 DIAGNOSIS — R269 Unspecified abnormalities of gait and mobility: Secondary | ICD-10-CM | POA: Diagnosis not present

## 2016-02-01 DIAGNOSIS — M79605 Pain in left leg: Secondary | ICD-10-CM | POA: Diagnosis not present

## 2016-02-01 DIAGNOSIS — R102 Pelvic and perineal pain: Secondary | ICD-10-CM | POA: Diagnosis not present

## 2016-02-01 DIAGNOSIS — R52 Pain, unspecified: Secondary | ICD-10-CM | POA: Diagnosis not present

## 2016-02-01 MED ORDER — POTASSIUM CHLORIDE CRYS ER 20 MEQ PO TBCR
40.0000 meq | EXTENDED_RELEASE_TABLET | Freq: Once | ORAL | Status: AC
  Administered 2016-02-01: 12:00:00 40 meq via ORAL
  Filled 2016-02-01: qty 2

## 2016-02-01 MED ORDER — OXYCODONE-ACETAMINOPHEN 5-325 MG PO TABS: 1.0000 | ORAL_TABLET | Freq: Three times a day (TID) | ORAL | Status: DC | PRN

## 2016-02-01 NOTE — Progress Notes (Signed)
Pt transported via EMS. 

## 2016-02-01 NOTE — Discharge Summary (Signed)
[place] [facility] [place] NAME: Amy [name]    MR#:  [id number]  DATE OF BIRTH:  1945-11-13  DATE OF ADMISSION:  01/30/2016 ADMITTING PHYSICIAN: Lance Coon, MD  DATE OF DISCHARGE: 02/01/2016 PRIMARY CARE PHYSICIAN: Osborne Casco, MD    ADMISSION DIAGNOSIS:  Lung nodule [R91.1] Pelvic pain in female [R10.2] Intractable pain [R52]   DISCHARGE DIAGNOSIS:  Abdominal pain, leg pain and back pain UTI Lung nodule SECONDARY DIAGNOSIS:   Past Medical History  Diagnosis Date  . Anxiety   . Dysphonia     intermittent "voice changes"  . Chronic back pain     lumbar steroid injection's  . Seasonal allergies   . Fibromyalgia   . Weakness     both hands and feet  . Urinary frequency   . Glaucoma   . History of MRSA infection 2011  . History of DVT (deep vein thrombosis) 1989    LEFT UPPER ARM  . Hypertension   . Short of breath on exertion   . Depression   . GERD (gastroesophageal reflux disease)   . Pelvic pain in female   . Arthritis     ddd- RA  . Irregular heart rate     "years ago"  . Peripheral vascular disease ([place])     "poor circulation"  . Mild obstructive sleep apnea     per study 02-07-2006 - no cpap  . Asthma     "sleeping asthma"  . History of hiatal hernia   . Headache   . Neuropathy (HCC)     feet  . Low iron   . Constipation     HOSPITAL COURSE:   Abdominal pain, leg pain and back pain. Unclear etiology. Pain control.  UTI (lower urinary tract infection) - remains nitrite positive despite 3 days of ciprofloxacin.  Treated with IV Rocephin, follow up urine culture: no growth so far. Change to cipro po.   Essential hypertension - continue home meds  Depression - continue home meds  GERD (gastroesophageal reflux disease) - home dose PPI  * Hypokalemia. Improved with KCl.  A 7 mm ground-glass nodular density at the right lung base. Adenocarcinoma cannot be excluded. Follow  up by CT is recommended in 12 months.   DISCHARGE CONDITIONS:   Stable, discharge to SNF today.  CONSULTS OBTAINED:     DRUG ALLERGIES:   Allergies  Allergen Reactions  . Latex Other (See Comments)    Sores, blisters  . Penicillins Rash    UNKNOWN but able to take AMPICILLIN     DISCHARGE MEDICATIONS:   Current Discharge Medication List    CONTINUE these medications which have CHANGED   Details  oxyCODONE-acetaminophen (PERCOCET/ROXICET) 5-325 MG tablet Take 1 tablet by mouth every 8 (eight) hours as needed for moderate pain. Qty: 12 tablet, Refills: 0      CONTINUE these medications which have NOT CHANGED   Details  B Complex Vitamins (VITAMIN B COMPLEX PO) Take 1 tablet by mouth daily.    Calcium Carbonate-Vitamin D (CALCIUM-VITAMIN D3 PO) Take 1 tablet by mouth daily.    ciprofloxacin (CIPRO) 500 MG tablet Take 1 tablet (500 mg total) by mouth 2 (two) times daily. Qty: 20 tablet, Refills: 0    cloNIDine (CATAPRES) 0.1 MG tablet Take 0.1 mg by mouth 2 (two) times daily. For Systolic > [id number]    diphenhydrAMINE (BENADRYL) 25 mg capsule Take 25 mg by mouth 2 (two) times daily as needed for allergies.  ESTRACE VAGINAL 0.1 MG/GM vaginal cream Place 1 application vaginally 2 (two) times a week.  Refills: 5    gabapentin (NEURONTIN) 100 MG capsule Take 300 mg by mouth at bedtime.    glycopyrrolate (ROBINUL) 2 MG tablet Take 1 mg by mouth 2 (two) times daily.     hydrALAZINE (APRESOLINE) 25 MG tablet Take 25 mg by mouth 3 (three) times daily.    L-Methylfolate-B6-B12 (METANX PO) Take 1 tablet by mouth 2 (two) times daily.     losartan-hydrochlorothiazide (HYZAAR) 100-25 MG per tablet Take 1 tablet by mouth every morning. Reported on 01/31/2016    Multiple Vitamin (MULTIVITAMIN WITH MINERALS) TABS Take 1 tablet by mouth daily.    NIFEdipine (PROCARDIA) 10 MG capsule Take 10 mg by mouth 3 (three) times daily.    pantoprazole (PROTONIX) 40 MG tablet Take 40 mg by  mouth daily.     PARoxetine (PAXIL) 40 MG tablet Take 40 mg by mouth daily. Reported on 01/18/2016 Refills: 4    polyethylene glycol (MIRALAX / GLYCOLAX) packet Take 17 g by mouth daily.    potassium chloride SA (K-DUR,KLOR-CON) 20 MEQ tablet Take 40 mEq by mouth daily.     sennosides-docusate sodium (SENOKOT-S) 8.6-50 MG tablet Take 2 tablets by mouth at bedtime.    vitamin B-12 (CYANOCOBALAMIN) 1000 MCG tablet Take 1,000 mcg by mouth at bedtime.    vitamin E (VITAMIN E) 400 UNIT capsule Take 400 Units by mouth daily.      STOP taking these medications     HYDROmorphone (DILAUDID) 2 MG tablet      silodosin (RAPAFLO) 8 MG CAPS capsule          DISCHARGE INSTRUCTIONS:    If you experience worsening of your admission symptoms, develop shortness of breath, life threatening emergency, suicidal or homicidal thoughts you must seek medical attention immediately by calling 911 or calling your MD immediately  if symptoms less severe.  You Must read complete instructions/literature along with all the possible adverse reactions/side effects for all the Medicines you take and that have been prescribed to you. Take any new Medicines after you have completely understood and accept all the possible adverse reactions/side effects.   Please note  You were cared for by a hospitalist during your hospital stay. If you have any questions about your discharge medications or the care you received while you were in the hospital after you are discharged, you can call the unit and asked to speak with the hospitalist on call if the hospitalist that took care of you is not available. Once you are discharged, your primary care physician will handle any further medical issues. Please note that NO REFILLS for any discharge medications will be authorized once you are discharged, as it is imperative that you return to your primary care physician (or establish a relationship with a primary care physician if you do  not have one) for your aftercare needs so that they can reassess your need for medications and monitor your lab values.    Today   SUBJECTIVE    Still abdominal and leg pain.   VITAL SIGNS:  Blood pressure 162/68, pulse 101, temperature 98.2 F (36.8 C), temperature source Oral, resp. rate 16, height 5\' 5"  (1.651 m), weight 121.065 kg (266 lb 14.4 oz), SpO2 98 %.  I/O:   Intake/Output Summary (Last 24 hours) at 02/01/16 1156 Last data filed at 02/01/16 0900  Gross per 24 hour  Intake    480 ml  Output  0 ml  Net    480 ml    PHYSICAL EXAMINATION:  GENERAL:  70 y.o.-year-old patient lying in the bed with no acute distress. Obese. EYES: Pupils equal, round, reactive to light and accommodation. No scleral icterus. Extraocular muscles intact.  HEENT: Head atraumatic, normocephalic. Oropharynx and nasopharynx clear.  NECK:  Supple, no jugular venous distention. No thyroid enlargement, no tenderness.  LUNGS: Normal breath sounds bilaterally, no wheezing, rales,rhonchi or crepitation. No use of accessory muscles of respiration.  CARDIOVASCULAR: S1, S2 normal. No murmurs, rubs, or gallops.  ABDOMEN: Soft, diffuse tenderness, non-distended. Bowel sounds present. No organomegaly or mass.  EXTREMITIES: No pedal edema, cyanosis, or clubbing.  NEUROLOGIC: Cranial nerves II through XII are intact. Muscle strength 3-4/5 in all extremities. Sensation intact. Gait not checked.  PSYCHIATRIC: The patient is alert and oriented x 3.  SKIN: No obvious rash, lesion, or ulcer.   DATA REVIEW:   CBC  Recent Labs Lab 01/31/16 0456  WBC 5.1  HGB 11.4*  HCT 34.6*  PLT 220    Chemistries   Recent Labs Lab 01/30/16 1652 01/31/16 0456  NA 135 140  K 3.1* 3.4*  CL 98* 102  CO2 28 29  GLUCOSE 123* 130*  BUN 12 12  CREATININE 0.93 0.87  CALCIUM 9.2 8.9  MG  --  2.0  AST 17  --   ALT 13*  --   ALKPHOS 115  --   BILITOT 0.7  --     Cardiac Enzymes  Recent Labs Lab  01/30/16 1652  TROPONINI 0.03    Microbiology Results  Results for orders placed or performed during the hospital encounter of 01/30/16  Urine culture     Status: None (Preliminary result)   Collection Time: 01/30/16  6:49 PM  Result Value Ref Range Status   Specimen Description URINE, RANDOM  Final   Special Requests NONE  Final   Culture NO GROWTH < 24 HOURS  Final   Report Status PENDING  Incomplete    RADIOLOGY:  Dg Abd 2 Views  01/30/2016  CLINICAL DATA:  Abdominal pain with nausea and vomiting for several weeks. EXAM: ABDOMEN - 2 VIEW COMPARISON:  None. FINDINGS: No evidence of dilated bowel loops or free air. Residual contrast seen within the urinary tract from recent CT. Lumbar spine fusion hardware again noted. IMPRESSION: Normal bowel gas pattern.  No acute findings. Electronically Signed   By: Earle Gell M.D.   On: 01/30/2016 19:55   Ct Cta Abd/pel W/cm &/or W/o Cm  01/30/2016  CLINICAL DATA:  70 year old female with continued pelvic and lower abdominal pain. Patient was seen in the ER 2 days ago with diagnosis of bladder infection. Clinical concern for aortic dissection. EXAM: CTA ABDOMEN AND PELVIS wITHOUT AND WITH CONTRAST TECHNIQUE: Multidetector CT imaging of the abdomen and pelvis was performed using the standard protocol during bolus administration of intravenous contrast. Multiplanar reconstructed images and MIPs were obtained and reviewed to evaluate the vascular anatomy. CONTRAST:  100 cc Isovue 370 COMPARISON:  CT dated 01/28/2016 FINDINGS: Minimal left lung base atelectatic changes. Focal area of linear scarring noted in the lingula. There is a 7 mm ground-glass nodular density in the right lower lobe (series 4 image 15). Adenocarcinoma cannot be excluded. Follow up by CT is recommended in 12 months, with continued annual surveillance for a minimum of 3 years. No intra-abdominal free air or free fluid. Cholecystectomy. The liver, pancreas, spleen, and the right adrenal  gland appear unremarkable. There is  a 1.5 cm indeterminate left adrenal nodule, possibly an adenoma. The kidneys, visualized ureters, and urinary bladder appear unremarkable. The hysterectomy. Moderate stool noted throughout the colon. There is no evidence of bowel obstruction or active inflammation. The appendix is not visualized with certainty. No inflammatory changes identified in the right lower quadrant. There is moderate aortoiliac atherosclerotic disease. There is no evidence of aortic dissection or aneurysm. The origins of the celiac axis, SMA, IMA as well as the origins of the renal arteries appear patent. The IVC and the portal vein appear unremarkable. No portal venous gas identified. There is no adenopathy. Midline vertical anterior pelvic wall incisional scar noted. There is multilevel postsurgical changes of the lumbar spine including intervertebral cage at L1-L2 and L3-L4. L4-L5 new L5-S1 anterior fixation plate and screws caps noted. The left pedicular screw noted at L1. There is laminectomy with bilateral posterior fixation screws at L2 and L3. There is postsurgical changes and inflammation of the paraspinal soft tissue and musculature. There is a 2.1 x 6.9 cm complex appearing fluid collection at the operative bed extending from the posterior elements to the subcutaneous soft tissues of the lower back at the level of L1-L3. This likely represents a postsurgical seroma. An infected fluid or developing abscess is not excluded. Clinical correlation is recommended. No acute fracture identified. Review of the MIP images confirms the above findings. IMPRESSION: No acute intra-abdominal pelvic pathology. Specifically there is no evidence of aortic dissection or aneurysm. A 7 mm ground-glass nodular density at the right lung base. Adenocarcinoma cannot be excluded. Follow up by CT is recommended in 12 months, with continued annual surveillance for a minimum of 3 years.These recommendations are taken  from:Recommendations for the Management of Subsolid Pulmonary Nodules Detected at CT: A Statement from the [facility] 2013; 266:1, [phone]. Postsurgical changes of lumbar spine as described with a small fluid collection Set in the surgical bed, likely postoperative seroma. Electronically Signed   By: Anner Crete M.D.   On: 01/30/2016 19:15        Management plans discussed with the patient, family and they are in agreement.  CODE STATUS:     Code Status Orders        Start     Ordered   01/31/16 0153  Do not attempt resuscitation (DNR)   Continuous    Question Answer Comment  In the event of cardiac or respiratory ARREST Do not call a "code blue"   In the event of cardiac or respiratory ARREST Do not perform Intubation, CPR, defibrillation or ACLS   In the event of cardiac or respiratory ARREST Use medication by any route, position, wound care, and other measures to relive pain and suffering. May use oxygen, suction and manual treatment of airway obstruction as needed for comfort.      01/31/16 0152    Code Status History    Date Active Date Inactive Code Status Order ID Comments User Context   01/31/2016  1:39 AM 01/31/2016  1:52 AM Full Code [id number]  Lance Coon, MD Inpatient   09/28/2015  4:33 PM 10/03/2015  7:29 PM Full Code [id number]  Kary Kos, MD Inpatient   05/05/2014  2:40 PM 05/06/2014  2:44 PM Full Code [id number]  Tobi Bastos, MD Inpatient   07/27/2013  4:04 PM 07/31/2013  7:51 PM Full Code [id number]  Elaina Hoops, MD Inpatient      TOTAL TIME TAKING CARE OF THIS PATIENT: 36  minutes.    Bridgett Larsson, Sheppard Evens  M.D on 02/01/2016 at 11:56 AM  Between 7am to 6pm - Pager - [phone]  After 6pm go to [email or web address] - password [email or web address] [facility]  Office  [phone]  CC: Primary care physician; Osborne Casco, MD

## 2016-02-01 NOTE — NC FL2 (Signed)
[place] LEVEL OF CARE SCREENING TOOL     IDENTIFICATION  Patient Name: Amy [name] Birthdate: 23-Mar-1946 Sex: female Admission Date (Current Location): 01/30/2016  [place] and [place] Number:  Engineering geologist and Address:  [facility], [address], [place], [place] [address]      Provider Number: [id number]  Attending Physician Name and Address:  Demetrios Loll, MD  Relative Name and Phone Number:       Current Level of Care: Hospital Recommended Level of Care: [place] Prior Approval Number:    Date Approved/Denied:   PASRR Number:  ( [id number] )  Discharge Plan: SNF    Current Diagnoses: Patient Active Problem List   Diagnosis Date Noted  . Intractable pain 01/31/2016  . UTI (lower urinary tract infection) 01/31/2016  . 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  . Neuropathic pain of both legs 10/13/2015  . Constipation 10/13/2015  . Complete rotator cuff tear of left shoulder 05/05/2014  . Depression 08/06/2013  . Hypokalemia 08/06/2013  . GERD (gastroesophageal reflux disease) 08/06/2013  . Spinal stenosis of lumbar region 08/06/2013  . Bladder spasm 08/06/2013  . Arthritis 08/06/2013  . Obesity, Class III, BMI 40-49.9 (morbid obesity) ([place]) 02/03/2013  . Lipoma of buttock s/p excision 02/20/2013 02/03/2013  . DYSPNEA 01/23/2008  . COUGH 01/23/2008  . OBSTRUCTIVE SLEEP APNEA 09/12/2007  . Essential hypertension 09/12/2007  . ALLERGIC RHINITIS 09/12/2007  . SLEEPINESS 09/12/2007  . ANGINA, HX OF 09/12/2007    Orientation RESPIRATION BLADDER Height & Weight     Self, Time, Situation, Place  Normal Incontinent Weight: 266 lb 14.4 oz (121.065 kg) Height:  5\' 5"  (165.1 cm)  BEHAVIORAL SYMPTOMS/MOOD NEUROLOGICAL BOWEL NUTRITION STATUS   (none )  (none ) Continent Diet (Diet: Heart Healthy )  AMBULATORY STATUS COMMUNICATION OF NEEDS Skin   Extensive Assist Verbally Normal                     Personal Care Assistance Level of Assistance  Bathing, Feeding, Dressing Bathing Assistance: Limited assistance Feeding assistance: Independent Dressing Assistance: Limited assistance     Functional Limitations Info  Sight, Hearing, Speech Sight Info: Adequate Hearing Info: Adequate Speech Info: Adequate    SPECIAL CARE FACTORS FREQUENCY  PT (By licensed PT), OT (By licensed OT)     PT Frequency:  (5) OT Frequency:  (5)            Contractures      Additional Factors Info  Code Status, Allergies Code Status Info:  (DNR ) Allergies Info:  (Latex, Penicillins )           Current Medications (02/01/2016):  This is the current hospital active medication list Current Facility-Administered Medications  Medication Dose Route Frequency Provider Last Rate Last Dose  . acetaminophen (TYLENOL) tablet 650 mg  650 mg Oral Q6H PRN Lance Coon, MD       Or  . acetaminophen (TYLENOL) suppository 650 mg  650 mg Rectal Q6H PRN Lance Coon, MD      . cefTRIAXone (ROCEPHIN) 1 g in dextrose 5 % 50 mL IVPB  1 g Intravenous Daily Lance Coon, MD   1 g at 01/31/16 1813  . diphenhydrAMINE (BENADRYL) capsule 25 mg  25 mg Oral Q6H PRN Lance Coon, MD   25 mg at 01/31/16 0208  . enoxaparin (LOVENOX) injection 40 mg  40 mg Subcutaneous BID Lance Coon, MD   40 mg at 02/01/16 [id number]  .  gabapentin (NEURONTIN) capsule 300 mg  300 mg Oral QHS Demetrios Loll, MD   300 mg at 01/31/16 2035  . hydrALAZINE (APRESOLINE) tablet 25 mg  25 mg Oral TID Lance Coon, MD   25 mg at 02/01/16 0925  . losartan (COZAAR) tablet 100 mg  100 mg Oral Daily Lance Coon, MD   100 mg at 02/01/16 [id number]   And  . hydrochlorothiazide (HYDRODIURIL) tablet 25 mg  25 mg Oral Daily Lance Coon, MD   25 mg at 02/01/16 0925  . HYDROmorphone (DILAUDID) injection 1 mg  1 mg Intravenous Q4H PRN Lance Coon, MD   1 mg at 02/01/16 0606  . ondansetron (ZOFRAN) tablet 4 mg  4 mg Oral Q6H PRN Lance Coon, MD       Or  .  ondansetron [facility]) injection 4 mg  4 mg Intravenous Q6H PRN Lance Coon, MD      . oxyCODONE-acetaminophen (PERCOCET/ROXICET) 5-325 MG per tablet 1 tablet  1 tablet Oral Q6H PRN Demetrios Loll, MD      . pantoprazole (PROTONIX) EC tablet 40 mg  40 mg Oral QAC breakfast Lance Coon, MD   40 mg at 02/01/16 0925  . PARoxetine (PAXIL) tablet 40 mg  40 mg Oral Daily Lance Coon, MD   40 mg at 02/01/16 0924  . polyethylene glycol (MIRALAX / GLYCOLAX) packet 17 g  17 g Oral Daily Demetrios Loll, MD   17 g at 02/01/16 0924  . potassium chloride SA (K-DUR,KLOR-CON) CR tablet 40 mEq  40 mEq Oral Once Demetrios Loll, MD      . senna-docusate (Senokot-S) tablet 2 tablet  2 tablet Oral QHS Demetrios Loll, MD   2 tablet at 01/31/16 2035  . tamsulosin (FLOMAX) capsule 0.4 mg  0.4 mg Oral Daily Lance Coon, MD   0.4 mg at 02/01/16 [id number]     Discharge Medications: Please see discharge summary for a list of discharge medications.  Relevant Imaging Results:  Relevant Lab Results:   Additional Information  (SSN: [id number])  Loralyn Freshwater, LCSW

## 2016-02-01 NOTE — Evaluation (Signed)
Physical Therapy Evaluation Patient Details Name: Amy [name] MRN: [id number] DOB: August 22, 1946 Today's Date: 02/01/2016   History of Present Illness  Patient is a 70 y/o female that presents with anterior abdominal, thigh pain with report of developing decreased sensation posteriorly around lumbar flank. She has history of multiple spinal fusion and revision surgeries, including one on 01/02/16 after which she dc'd to rehab for 2 weeks.   Clinical Impression  Patient is a 70 y/o female that presents with recent history of spinal fusion revision (~1 month ago) that was able to ambulate after 2 weeks while at rehab and presents with abdominal pain and inability to ambulate. She describes her pain as beginning in the last week, radiating to her groin area and not impacted by medications to date per patient. She reports no numbness/tingling in her LEs, she does note onset of decreased sensation in her posterior flank. She requires significant assistance to complete bed mobility secondary to pain and weakness and requires additional assistance with sit to stand transfer with RW (cuing for hand placement). She is unable to advance her LEs or lift her feet to ambulate in this session due to weakness and ultimately requires PT assistance to do so. Unclear if this is from acute abdominal pain or multiple lumbar revisions. She will require additional skilled rehabilitation to increase her independence with mobility to return home.     Follow Up Recommendations SNF    Equipment Recommendations       Recommendations for Other Services       Precautions / Restrictions Precautions Precautions: Fall;Back Restrictions Weight Bearing Restrictions: No      Mobility  Bed Mobility Overal bed mobility: Needs Assistance;+2 for physical assistance Bed Mobility: Supine to Sit     Supine to sit: Min assist;Mod assist;+2 for physical assistance;HOB elevated     General bed mobility comments:  Patient requires bed be moved into chair position, she requires assistance to bring her LEs and torso to the edge of the bed.   Transfers Overall transfer level: Needs assistance Equipment used: Rolling walker (2 wheeled) Transfers: Sit to/from Stand Sit to Stand: Mod assist;Max assist;+2 physical assistance         General transfer comment: Patient required a good bit of assistance to bring her torso off the edge of the bed due to gross LE weakness (appears to be related to pain as well, difficult to determine exact source).   Ambulation/Gait             General Gait Details: Patient unable to lift her feet to advance independently, with PT assist she is able to bring one foot forward and backward to mimic ambulating. She is only able to slide her feet to the R/L for mobility for 0.5-1' in this session.   Stairs            Wheelchair Mobility    Modified Rankin (Stroke Patients Only)       Balance Overall balance assessment: Needs assistance Sitting-balance support: No upper extremity supported Sitting balance-Leahy Scale: Normal     Standing balance support: Bilateral upper extremity supported Standing balance-Leahy Scale: Poor                               Pertinent Vitals/Pain Pain Assessment: Faces Faces Pain Scale: Hurts even more Pain Location: Abdominal area, radiating to groin area.  Pain Descriptors / Indicators: Aching Pain Intervention(s): Limited activity within patient's tolerance;Monitored  during session;Repositioned    Home Living Family/patient expects to be discharged to:: Private residence Living Arrangements: Spouse/significant other;Children Available Help at Discharge: Family;Available PRN/intermittently (Husband works and daughter is unable to provide care due to health concerns) Type of Home: House Home Access: Stairs to enter Entrance Stairs-Rails: Right Entrance Stairs-Number of Steps: 2 Home Layout: One level Home  Equipment: Cane - single point;Bedside commode;Shower seat;Other (comment) (Adjustable bed ) Additional Comments: Pt reports she had no assistance during the day as spouse works and her daughter has medical issues herself    Prior Function Level of Independence: Needs assistance   Gait / Transfers Assistance Needed: Patient had been ambulatory after most recent spinal operation however since that time with her new onset abdominal pain she is unable to ambulate at home.   ADL's / Homemaking Assistance Needed: Per old notes she requires assistance with laundry and cooking. Her daughter assisted with showers.         Hand Dominance   Dominant Hand: Right    Extremity/Trunk Assessment   Upper Extremity Assessment: Overall WFL for tasks assessed           Lower Extremity Assessment: Generalized weakness         Communication   Communication: No difficulties  Cognition Arousal/Alertness: Awake/alert Behavior During Therapy: WFL for tasks assessed/performed;Flat affect (She is initially quite cold with PT, however after long discussion she seems to open up.) Overall Cognitive Status: Within Functional Limits for tasks assessed                      General Comments      Exercises        Assessment/Plan    PT Assessment Patient needs continued PT services  PT Diagnosis Difficulty walking;Generalized weakness   PT Problem List Decreased strength;Decreased knowledge of use of DME;Pain;Decreased activity tolerance;Decreased balance;Cardiopulmonary status limiting activity;Decreased mobility  PT Treatment Interventions DME instruction;Gait training;Stair training;Therapeutic activities;Therapeutic exercise;Balance training   PT Goals (Current goals can be found in the Care Plan section) Acute Rehab PT Goals Patient Stated Goal: To get stronger to walk better  PT Goal Formulation: With patient Time For Goal Achievement: 02/15/16 Potential to Achieve Goals: Good     Frequency Min 2X/week   Barriers to discharge Decreased caregiver support Patient would require multiple able bodies to complete necessary mobility tasks currently which she does not have at home.     Co-evaluation               End of Session Equipment Utilized During Treatment: Gait belt Activity Tolerance: Patient tolerated treatment well Patient left: in bed;with bed alarm set;with call bell/phone within reach (She asked to sit at edge of bed with call bell in reach) Nurse Communication: Mobility status    Functional Assessment Tool Used: Clinical judgement  Functional Limitation: Mobility: Walking and moving around Mobility: Walking and Moving Around Current Status [id number]): At least 60 percent but less than 80 percent impaired, limited or restricted Mobility: Walking and Moving Around Goal Status [phone]): At least 60 percent but less than 80 percent impaired, limited or restricted    Time: 0955-1030 PT Time Calculation (min) (ACUTE ONLY): 35 min   Charges:   PT Evaluation $PT Eval Moderate Complexity: 1 Procedure     PT G Codes:   PT G-Codes **NOT FOR INPATIENT CLASS** Functional Assessment Tool Used: Clinical judgement  Functional Limitation: Mobility: Walking and moving around Mobility: Walking and Moving Around Current Status [id number]): At least  60 percent but less than 80 percent impaired, limited or restricted Mobility: Walking and Moving Around Goal Status [phone]): At least 60 percent but less than 80 percent impaired, limited or restricted   Kerman Passey, PT, DPT    02/01/2016, 3:18 PM

## 2016-02-01 NOTE — Clinical Social Work Note (Signed)
Clinical Social Work Assessment  Patient Details  Name: Amy [name] MRN: [id number] Date of Birth: Feb 12, 1946  Date of referral:  02/01/16               Reason for consult:  Facility Placement                Permission sought to share information with:  Chartered certified accountant granted to share information::  Yes, Verbal Permission Granted  Name::      Public affairs consultant::   [place]   Relationship::     Contact Information:     Housing/Transportation Living arrangements for the past 2 months:  [place], [place] of Information:  Patient Patient Interpreter Needed:  None Criminal Activity/Legal Involvement Pertinent to Current Situation/Hospitalization:  No - Comment as needed Significant Relationships:  Adult Children, Spouse Lives with:  Spouse, Adult Children Do you feel safe going back to the place where you live?  Yes Need for family participation in patient care:  Yes (Comment)  Care giving concerns:  Patient lives in [place] with her daughter and husband.    Social Worker assessment / plan:  Holiday representative (CSW) received verbal consult from RN Case Manager that PT is recommending SNF. Patient is under Medicare observation this admission however she has an inpatient admission to [facility] from 01/02/16 to 01/06/16. Patient can use last admission to [facility] to qualify for SNF because it is within 30 days. CSW met with patient alone at bedside. Patient was sitting up on the side of the bed and was alert and oriented. CSW introduced self and explained that patient is eligible for rehab at this time based on her last admission. Patient reported that she just got out of [facility] and spent 11 days there. Patient reported that she cannot walk and the doctors cannot find what's wrong with her. Patient is agreeable to returning to [facility] from [facility].   Patient is medically stable for D/C  to [facility] today. Per [facility] admissions coordinator at [facility] patient will go to room 601. RN will call report to [facility] and arrange EMS for transport. CSW sent D/C Summary, FL2 and D/C Packet to [place] via [facility]. Patient is aware of above. CSW contacted patient's husband Lissa Merlin and made him aware of above. Please reconsult if future social work needs arise. CSW signing off.   Employment status:  Disabled (Comment on whether or not currently receiving Disability), Retired Forensic scientist:  Medicare PT Recommendations:  [place] / Referral to community resources:  [place]  Patient/Family's Response to care:  Patient is agreeable to going to [facility] .   Patient/Family's Understanding of and Emotional Response to Diagnosis, Current Treatment, and Prognosis:  Patient was pleasant and thanked CSW for visit.   Emotional Assessment Appearance:  Appears stated age Attitude/Demeanor/Rapport:    Affect (typically observed):  Accepting, Adaptable, Pleasant Orientation:  Oriented to Self, Oriented to Place, Oriented to  Time, Oriented to Situation Alcohol / Substance use:  Not Applicable Psych involvement (Current and /or in the community):  No (Comment)  Discharge Needs  Concerns to be addressed:  Discharge Planning Concerns Readmission within the last 30 days:  Yes Current discharge risk:  Chronically ill, Dependent with Mobility Barriers to Discharge:  No Barriers Identified   Loralyn Freshwater, LCSW 02/01/2016, 1:12 PM

## 2016-02-01 NOTE — Care Management (Signed)
Spoke with physical therapy and will recommend skilled nursing. Patient placed in observation 5/9 for intractable  pain.  Informed that patient is requesting a wheelchair and rolling walker.  Says she received her last walker "maybe around 2012.  Discussed that could not guarantee walker would be covered.  Physical; therapy is recommending skilled nursing placement.  Found that patient had a recent inpatient hospital stay at [facility] 4/10 - 4/14. Confirmed in chart review that there was an inpatient admission order for 4/10 and a discharge order for 4/14.  Patient was discharged to [facility] and says she spent "around 11 days."    Physical therapy has again recommended skilled nursing .  Patient is agreeable.  Discussed that walker and wheelchair would have to be ordered at discharge from the facility.  Notified [facility] of admission and transfer back to skilled.  Notified CSW.  Notified [facility].

## 2016-02-01 NOTE — Clinical Social Work Placement (Signed)
   CLINICAL SOCIAL WORK PLACEMENT  NOTE  Date:  02/01/2016  Patient Details  Name: Amy [name] MRN: [id number] Date of Birth: 09-28-1945  Clinical Social Work is seeking post-discharge placement for this patient at the [place] level of care (*CSW will initial, date and re-position this form in  chart as items are completed):  Yes   Patient/family provided with [place] Work Department's list of facilities offering this level of care within the geographic area requested by the patient (or if unable, by the patient's family).  Yes   Patient/family informed of their freedom to choose among providers that offer the needed level of care, that participate in Medicare, Medicaid or managed care program needed by the patient, have an available bed and are willing to accept the patient.  Yes   Patient/family informed of [facility]'s ownership interest in [facility] and [facility], as well as of the fact that they are under no obligation to receive care at these facilities.  PASRR submitted to EDS on       PASRR number received on       Existing PASRR number confirmed on 02/01/16     FL2 transmitted to all facilities in geographic area requested by pt/family on 02/01/16     FL2 transmitted to all facilities within larger geographic area on       Patient informed that his/her managed care company has contracts with or will negotiate with certain facilities, including the following:        Yes   Patient/family informed of bed offers received.  Patient chooses bed at  [facility] )     Physician recommends and patient chooses bed at      Patient to be transferred to  [facility] ) on 02/01/16.  Patient to be transferred to facility by  [facility] )     Patient family notified on 02/01/16 of transfer.  Name of family member notified:   (Patient's husband Lissa Merlin is aware of D/C today. )     PHYSICIAN        Additional Comment:    _______________________________________________ Loralyn Freshwater, LCSW 02/01/2016, 1:11 PM

## 2016-02-01 NOTE — Discharge Instructions (Signed)
Heart healthy diet. Activity as tolerated. Fall precaution. Resume HHPT.

## 2016-02-01 NOTE — Progress Notes (Signed)
Called report to [facility].  EMS contacted for transport.

## 2016-02-02 ENCOUNTER — Encounter: Payer: Self-pay | Admitting: Adult Health

## 2016-02-02 ENCOUNTER — Non-Acute Institutional Stay (SKILLED_NURSING_FACILITY): Payer: Medicare Other | Admitting: Adult Health

## 2016-02-02 DIAGNOSIS — G5793 Unspecified mononeuropathy of bilateral lower limbs: Secondary | ICD-10-CM

## 2016-02-02 DIAGNOSIS — F32A Depression, unspecified: Secondary | ICD-10-CM

## 2016-02-02 DIAGNOSIS — K219 Gastro-esophageal reflux disease without esophagitis: Secondary | ICD-10-CM | POA: Diagnosis not present

## 2016-02-02 DIAGNOSIS — R109 Unspecified abdominal pain: Secondary | ICD-10-CM

## 2016-02-02 DIAGNOSIS — K5901 Slow transit constipation: Secondary | ICD-10-CM | POA: Diagnosis not present

## 2016-02-02 DIAGNOSIS — M48061 Spinal stenosis, lumbar region without neurogenic claudication: Secondary | ICD-10-CM

## 2016-02-02 DIAGNOSIS — I10 Essential (primary) hypertension: Secondary | ICD-10-CM | POA: Diagnosis not present

## 2016-02-02 DIAGNOSIS — E876 Hypokalemia: Secondary | ICD-10-CM

## 2016-02-02 DIAGNOSIS — F329 Major depressive disorder, single episode, unspecified: Secondary | ICD-10-CM | POA: Diagnosis not present

## 2016-02-02 DIAGNOSIS — G5791 Unspecified mononeuropathy of right lower limb: Secondary | ICD-10-CM | POA: Diagnosis not present

## 2016-02-02 DIAGNOSIS — G5792 Unspecified mononeuropathy of left lower limb: Secondary | ICD-10-CM

## 2016-02-02 DIAGNOSIS — M4806 Spinal stenosis, lumbar region: Secondary | ICD-10-CM | POA: Diagnosis not present

## 2016-02-02 LAB — URINE CULTURE: Culture: NO GROWTH

## 2016-02-02 NOTE — Progress Notes (Signed)
Patient ID: Amy [name], female   DOB: 03-17-1946, 70 y.o.   MRN: [id number]   Location:  [place] Room Number: [id number] Place of Service:  SNF (31)   CODE STATUS: Full Code  Allergies  Allergen Reactions  . Latex Other (See Comments)    Sores, blisters  . Penicillins Rash    UNKNOWN but able to take AMPICILLIN     Chief Complaint  Patient presents with  . Hospitalization Follow-up    Hospital Follow up    HPI:  She had recently been hospitalized in April of this year for lumbar fusion and removal of hardware. She had returned home. She developed intractable abdominal pain went to the hospital. She was treated for an e-coli uti. Her abdominal is not new; she has seen GI in the past for this pain. She tells me that "they" cannot figure out what is causing the pain. She is here for short term rehab with her goal to return back home.    Past Medical History  Diagnosis Date  . Anxiety   . Dysphonia     intermittent "voice changes"  . Chronic back pain     lumbar steroid injection's  . Seasonal allergies   . Fibromyalgia   . Weakness     both hands and feet  . Urinary frequency   . Glaucoma   . History of MRSA infection 2011  . History of DVT (deep vein thrombosis) 1989    LEFT UPPER ARM  . Hypertension   . Short of breath on exertion   . Depression   . GERD (gastroesophageal reflux disease)   . Pelvic pain in female   . Arthritis     ddd- RA  . Irregular heart rate     "years ago"  . Peripheral vascular disease ([place])     "poor circulation"  . Mild obstructive sleep apnea     per study 02-07-2006 - no cpap  . Asthma     "sleeping asthma"  . History of hiatal hernia   . Headache   . Neuropathy (HCC)     feet  . Low iron   . Constipation     Past Surgical History  Procedure Laterality Date  . Abdominal hysterectomy  1978  . Nose surgery  2007  . Mass excision Left 02/20/2013    Procedure: EXCISION LEFT BUTTOCK   MASS;  Surgeon: Adin Hector, MD;  Location: [facility];  Service: General;  Laterality: Left;  . Esophagogastroduodenoscopy N/A 07/15/2013    Procedure: ESOPHAGOGASTRODUODENOSCOPY (EGD);  Surgeon: Winfield Cunas., MD;  Location: [facility];  Service: Endoscopy;  Laterality: N/A;  need xray  . Abdominal adhesions removed    . Colonoscopy    . Bunionectomy Left 2008  . Shoulder open rotator cuff repair Left 05/05/2014    Procedure: OPEN ACROMIONECTOMY AND OPEN REPAIR OF ROTATOR CUFF, TISSUEMEND GRAFT WITH ANCHOR ;  Surgeon: Tobi Bastos, MD;  Location: [facility];  Service: Orthopedics;  Laterality: Left;  . Esophageal manometry N/A 11/22/2014    Procedure: ESOPHAGEAL MANOMETRY (EM);  Surgeon: Winfield Cunas., MD;  Location: [facility];  Service: Endoscopy;  Laterality: N/A;  . Lumbar fusion  2014    L4 -- L5  . Removal hardware l4-l5/  bilateral laminectomy l2 - l5 and fusion  06-12-2011  . Laparoscopic cholecystectomy  01-11-2006  . Cysto with hydrodistension N/A 07/12/2015    Procedure: CYSTOSCOPY/HYDRODISTENSION;  Surgeon: Bjorn Loser,  MD;  Location: [place];  Service: Urology;  Laterality: N/A;  . Eye surgery Bilateral     cataract surgery with lens implants  . Tonsillectomy    . Hardware removal N/A 01/02/2016    Procedure: Exploration of Lumbar Fusion,Removal of hardware Lumbar One-Two ;Redo Posterior Lumbar Fusion Lumbar One-Two;  Surgeon: Kary Kos, MD;  Location: [facility];  Service: Neurosurgery;  Laterality: N/A;    Social History   Social History  . Marital Status: Married    Spouse Name: N/A  . Number of Children: N/A  . Years of Education: N/A   Occupational History  . Not on file.   Social History Main Topics  . Smoking status: Former Smoker    Quit date: 07/10/1969  . Smokeless tobacco: Never Used  . Alcohol Use: Yes     Comment: rare  . Drug Use: No  . Sexual Activity: Not on file   Other Topics Concern  . Not on file    Social History Narrative   Family History  Problem Relation Age of Onset  . Hypertension Mother   . Cancer Mother     cervix  . Kidney disease Mother   . Hypertension Sister   . Cancer Sister     polyps  . Kidney disease Brother   . Cancer Daughter     leukemia  . Hypertension Brother       VITAL SIGNS BP 152/86 mmHg  Pulse 99  Temp(Src) 97.1 F (36.2 C) (Oral)  Resp 20  Ht 5\' 6"  (1.676 m)  Wt 266 lb 4 oz (120.77 kg)  BMI 42.99 kg/m2  SpO2 96%  Patient's Medications  New Prescriptions   No medications on file  Previous Medications   B COMPLEX VITAMINS (VITAMIN B COMPLEX PO)    Take 1 tablet by mouth daily.   CALCIUM CARBONATE-VITAMIN D (CALCIUM-VITAMIN D3 PO)    Take 1 tablet by mouth daily.   CIPROFLOXACIN (CIPRO) 500 MG TABLET    Take 1 tablet (500 mg total) by mouth 2 (two) times daily.   CLONIDINE (CATAPRES) 0.1 MG TABLET    Take 0.1 mg by mouth 2 (two) times daily. For Systolic > 99991111   DIPHENHYDRAMINE (BENADRYL) 25 MG CAPSULE    Take 25 mg by mouth 2 (two) times daily as needed for allergies.   ESTRACE VAGINAL 0.1 MG/GM VAGINAL CREAM    Place 1 application vaginally 2 (two) times a week.    GABAPENTIN (NEURONTIN) 100 MG CAPSULE    Take 300 mg by mouth at bedtime.   GLYCOPYRROLATE (ROBINUL) 2 MG TABLET    Take 1 mg by mouth 2 (two) times daily.    HYDRALAZINE (APRESOLINE) 25 MG TABLET    Take 25 mg by mouth 3 (three) times daily.   L-METHYLFOLATE-B6-B12 (METANX PO)    Take 1 tablet by mouth 2 (two) times daily.    LOSARTAN-HYDROCHLOROTHIAZIDE (HYZAAR) 100-25 MG PER TABLET    Take 1 tablet by mouth every morning. Reported on 01/31/2016   MULTIPLE VITAMIN (MULTIVITAMIN WITH MINERALS) TABS    Take 1 tablet by mouth daily.   NIFEDIPINE (PROCARDIA) 10 MG CAPSULE    Take 10 mg by mouth 3 (three) times daily.   OXYCODONE-ACETAMINOPHEN (PERCOCET/ROXICET) 5-325 MG TABLET    Take 1 tablet by mouth every 8 (eight) hours as needed for moderate pain.   PANTOPRAZOLE (PROTONIX)  40 MG TABLET    Take 40 mg by mouth daily.    PAROXETINE (PAXIL)  40 MG TABLET    Take 40 mg by mouth daily. Reported on 01/18/2016   POLYETHYLENE GLYCOL (MIRALAX / GLYCOLAX) PACKET    Take 17 g by mouth daily.   POTASSIUM CHLORIDE SA (K-DUR,KLOR-CON) 20 MEQ TABLET    Take 40 mEq by mouth daily.    SENNOSIDES-DOCUSATE SODIUM (SENOKOT-S) 8.6-50 MG TABLET    Take 2 tablets by mouth at bedtime.   VITAMIN B-12 (CYANOCOBALAMIN) 1000 MCG TABLET    Take 1,000 mcg by mouth at bedtime.   VITAMIN E (VITAMIN E) 400 UNIT CAPSULE    Take 400 Units by mouth daily.  Modified Medications   No medications on file  Discontinued Medications   No medications on file     SIGNIFICANT DIAGNOSTIC EXAMS  01-28-16: Ct of abdomen/pelvis/renal stone: No evidence of acute abnormality within the abdomen or pelvis. Postoperative changes within the lumbar spine as described. Consider further evaluation if there is strong clinical history for infection. Cardiomegaly and aortic atherosclerosis  LABS REVIEWED:   01-28-16: wbc 7.3; hgb 22.7; hct 35.2; mcv 85.4; plt 329; glucose 117; bun 18; creat 1.09; k+ 3.6; na++138; liver normal albumin 3.6; urine culture: e-coli 01-30-16: urine culture: no growth 01-31-16: wbc 5.1; hgb 11.4; hct 34.6; mcv 86.4;  plt 220; glucose 130; bun 12; creat 0.87; k+ 3.4; na++140; mag 2.0    Review of Systems  Constitutional: Negative for malaise/fatigue.       Has decreased appetite   Respiratory: Negative for cough and shortness of breath.   Cardiovascular: Negative for chest pain, palpitations and leg swelling.  Gastrointestinal: Positive for abdominal pain. Negative for heartburn and constipation.  Musculoskeletal: Positive for back pain. Negative for myalgias and joint pain.  Skin: Negative.   Neurological: Negative for dizziness.       Has right buttock numbness   Psychiatric/Behavioral: The patient is not nervous/anxious.     Physical Exam  Constitutional: She is oriented to person,  place, and time. She appears well-developed and well-nourished. No distress.  Eyes: Conjunctivae are normal.  Neck: Neck supple. No JVD present. No thyromegaly present.  Cardiovascular: Normal rate, regular rhythm and intact distal pulses.   Respiratory: Effort normal and breath sounds normal. No respiratory distress. She has no wheezes.  GI: Soft. Bowel sounds are normal. She exhibits no distension. There is tenderness.  Has generalized tenderness present   Musculoskeletal: She exhibits no edema.  Able to move all extremities  Wears back brace   Lymphadenopathy:    She has no cervical adenopathy.  Neurological: She is alert and oriented to person, place, and time.  Skin: Skin is warm and dry. She is not diaphoretic.  Psychiatric: She has a normal mood and affect.      ASSESSMENT/ PLAN:  1. Hypertension: will continue clonidine 0.1 mg twice daily; apresoline 25 mg three times daily; hyzaar 100-25 mg daily procardia 10 mg three times daily; will monitor  2. Gerd: will continue protonix 40 mg daily  Will continue robinul 2 mg twice daily   3. Spinal stenosis of lumbar spine: is status post surgery; due to her right buttock numbness will have her seen by neuro-surgeon. Will continue percocet 5/325 mg every 8 hours as needed will continue therapy as directed  4. Constipation: will continue miralax 17 gm daily will continue senna s 2 tabs nightly   5. Hypokalemia: will continue k+ 40 meq daily   6. Neuropathic pain to both legs: will continue neurontin 300 mg nightly   7. UI: will  continue estrace 1 applicator twice weekly   8. UTI; will complete cipro and will monitor her status  9. Abdominal pain: she was taking dicyclomine at home; will restart dicyclomine 10 mg four times daily will have her return back to GI.   10. Depression: will continue paxil 40 mg daily     Time spent with patient  50   minutes >50% time spent counseling; reviewing medical record; tests; labs; and  developing future plan of care      Ok Edwards NP [facility]  Contact [phone] Monday through Friday 8am- 5pm  After hours call [phone]

## 2016-02-05 DIAGNOSIS — R109 Unspecified abdominal pain: Secondary | ICD-10-CM

## 2016-02-05 HISTORY — DX: Unspecified abdominal pain: R10.9

## 2016-02-06 ENCOUNTER — Non-Acute Institutional Stay (SKILLED_NURSING_FACILITY): Payer: Medicare Other | Admitting: Internal Medicine

## 2016-02-06 ENCOUNTER — Encounter: Payer: Self-pay | Admitting: Internal Medicine

## 2016-02-06 DIAGNOSIS — M48061 Spinal stenosis, lumbar region without neurogenic claudication: Secondary | ICD-10-CM

## 2016-02-06 DIAGNOSIS — M4806 Spinal stenosis, lumbar region: Secondary | ICD-10-CM

## 2016-02-06 DIAGNOSIS — R109 Unspecified abdominal pain: Secondary | ICD-10-CM

## 2016-02-06 DIAGNOSIS — I10 Essential (primary) hypertension: Secondary | ICD-10-CM | POA: Diagnosis not present

## 2016-02-06 NOTE — Progress Notes (Signed)
Patient ID: Amy [name], female   DOB: August 13, 1946, 70 y.o.   MRN: [id number]  History and Physical   Location:  [facility] Room Number: [id number] Place of Service:  SNF (31)  PCP: Blanchie Serve, MD Patient Care Team: Blanchie Serve, MD as PCP - General (Internal Medicine)  Extended Emergency Contact Information Primary Emergency Contact: [name] Address: [address]          [place], [place] [address] [name] of [place] Phone: [phone] Mobile Phone: [phone] Relation: Spouse Secondary Emergency Contact: [name] Address: [address]          [place], [place] [address] [name] of [place] Phone: [phone] Mobile Phone: [phone] Relation: Daughter  Code Status: full Goals of Care: Advanced Directive information Advanced Directives 02/06/2016  Does patient have an advance directive? No  Does patient want to make changes to advanced directive? -  Would patient like information on creating an advanced directive? -      Chief Complaint  Patient presents with  . Readmit To SNF    following hospitalization 01/30/16 to 02/01/16 for abdominal, leg, back pain UTI    HPI: Patient is a 70 y.o. female seen today for readmission on 02/01/16 to [facility]  Following hospitaization from 05/08/217though 510/2017.  Patient was having intractable pains in the abdomen, legs, and back.  Etiology was not determined. Pain was controlled with oxycodone/acetaminophen 5/ 325, one every 8 hours as needed for pain control.  Patient is also noted to have a 7 mm lung nodule in the right lower lobe. She is to hive a repeat CT of the chest in 2 months.  Patient also had a urinary tract infection.  Other problems included hypertension, depression, GERD, a positive Tine test, and diminished hearing.  On today's exam, patient continues to complain of pelvic pains. She says that the pains are  worse with movement. Patient has had multiple back surgeries. The last was 01/02/2016 by Dr. Saintclair Halsted. CT scan on 01/30/2016 raised a question of a seroma versus infected fluid or abscess. She is to see Dr. Saintclair Halsted 02/07/2016.  Past Medical History  Diagnosis Date  . Anxiety   . Dysphonia     intermittent "voice changes"  . Chronic back pain     lumbar steroid injection's  . Seasonal allergies   . Fibromyalgia   . Weakness     both hands and feet  . Urinary frequency   . Glaucoma   . History of MRSA infection 2011  . History of DVT (deep vein thrombosis) 1989    LEFT UPPER ARM  . Hypertension   . Short of breath on exertion   . Depression   . GERD (gastroesophageal reflux disease)   . Pelvic pain in female   . Arthritis     ddd- RA  . Irregular heart rate     "years ago"  . Peripheral vascular disease ([place])     "poor circulation"  . Mild obstructive sleep apnea     per study 02-07-2006 - no cpap  . Asthma     "sleeping asthma"  . History of hiatal hernia   . Headache   . Neuropathy (HCC)     feet  . Low iron   . Constipation    Past Surgical History  Procedure Laterality Date  . Abdominal hysterectomy  1978  . Nose surgery  2007  . Mass excision Left 02/20/2013    Procedure:  EXCISION LEFT BUTTOCK  MASS;  Surgeon: Adin Hector, MD;  Location: [facility];  Service: General;  Laterality: Left;  . Esophagogastroduodenoscopy N/A 07/15/2013    Procedure: ESOPHAGOGASTRODUODENOSCOPY (EGD);  Surgeon: Winfield Cunas., MD;  Location: [facility];  Service: Endoscopy;  Laterality: N/A;  need xray  . Abdominal adhesions removed    . Colonoscopy    . Bunionectomy Left 2008  . Shoulder open rotator cuff repair Left 05/05/2014    Procedure: OPEN ACROMIONECTOMY AND OPEN REPAIR OF ROTATOR CUFF, TISSUEMEND GRAFT WITH ANCHOR ;  Surgeon: Tobi Bastos, MD;  Location: [facility];  Service: Orthopedics;  Laterality: Left;  . Esophageal manometry N/A 11/22/2014    Procedure: ESOPHAGEAL  MANOMETRY (EM);  Surgeon: Winfield Cunas., MD;  Location: [facility];  Service: Endoscopy;  Laterality: N/A;  . Lumbar fusion  2014    L4 -- L5  . Removal hardware l4-l5/  bilateral laminectomy l2 - l5 and fusion  06-12-2011  . Laparoscopic cholecystectomy  01-11-2006  . Cysto with hydrodistension N/A 07/12/2015    Procedure: CYSTOSCOPY/HYDRODISTENSION;  Surgeon: Bjorn Loser, MD;  Location: [facility];  Service: Urology;  Laterality: N/A;  . Eye surgery Bilateral     cataract surgery with lens implants  . Tonsillectomy    . Hardware removal N/A 01/02/2016    Procedure: Exploration of Lumbar Fusion,Removal of hardware Lumbar One-Two ;Redo Posterior Lumbar Fusion Lumbar One-Two;  Surgeon: Kary Kos, MD;  Location: [facility];  Service: Neurosurgery;  Laterality: N/A;    reports that she quit smoking about 46 years ago. She has never used smokeless tobacco. She reports that she drinks alcohol. She reports that she does not use illicit drugs. Social History   Social History  . Marital Status: Married    Spouse Name: N/A  . Number of Children: N/A  . Years of Education: N/A   Occupational History  . Not on file.   Social History Main Topics  . Smoking status: Former Smoker    Quit date: 07/10/1969  . Smokeless tobacco: Never Used  . Alcohol Use: Yes     Comment: rare  . Drug Use: No  . Sexual Activity: Not on file   Other Topics Concern  . Not on file   Social History Narrative     Family History  Problem Relation Age of Onset  . Hypertension Mother   . Cancer Mother     cervix  . Kidney disease Mother   . Hypertension Sister   . Cancer Sister     polyps  . Kidney disease Brother   . Cancer Daughter     leukemia  . Hypertension Brother     Health Maintenance  Topic Date Due  . Hepatitis C Screening  1946-09-09  . TETANUS/TDAP  01/22/1965  . COLONOSCOPY  01/23/1996  . ZOSTAVAX  01/22/2006  . DEXA SCAN  01/23/2011  . PNA vac Low Risk  Adult (1 of 2 - PCV13) 01/23/2011  . INFLUENZA VACCINE  04/24/2016  . MAMMOGRAM  11/28/2017    Allergies  Allergen Reactions  . Latex Other (See Comments)    Sores, blisters  . Penicillins Rash    UNKNOWN but able to take AMPICILLIN       Medication List       This list is accurate as of: 02/06/16  3:20 PM.  Always use your most recent med list.  CALCIUM-VITAMIN D3 PO  Take 1 tablet by mouth daily.     ciprofloxacin 500 MG tablet  Commonly known as:  CIPRO  Take 1 tablet (500 mg total) by mouth 2 (two) times daily.     cloNIDine 0.1 MG tablet  Commonly known as:  CATAPRES  Take 0.1 mg by mouth 2 (two) times daily. For Systolic > [id number]     diphenhydrAMINE 25 mg capsule  Commonly known as:  BENADRYL  Take 25 mg by mouth 2 (two) times daily as needed for allergies.     ESTRACE VAGINAL 0.1 MG/GM vaginal cream  Generic drug:  estradiol  Place 1 application vaginally 2 (two) times a week.     gabapentin 100 MG capsule  Commonly known as:  NEURONTIN  Take 300 mg by mouth at bedtime.     glycopyrrolate 2 MG tablet  Commonly known as:  ROBINUL  Take 1 mg by mouth 2 (two) times daily.     hydrALAZINE 25 MG tablet  Commonly known as:  APRESOLINE  Take 25 mg by mouth 3 (three) times daily.     losartan-hydrochlorothiazide 100-25 MG tablet  Commonly known as:  HYZAAR  Take 1 tablet by mouth every morning. Reported on 01/31/2016     METANX PO  Take 1 tablet by mouth 2 (two) times daily.     multivitamin with minerals Tabs tablet  Take 1 tablet by mouth daily.     NIFEdipine 10 MG capsule  Commonly known as:  PROCARDIA  Take 10 mg by mouth 3 (three) times daily.     oxyCODONE-acetaminophen 5-325 MG tablet  Commonly known as:  PERCOCET/ROXICET  Take 1 tablet by mouth every 8 (eight) hours as needed for moderate pain.     pantoprazole 40 MG tablet  Commonly known as:  PROTONIX  Take 40 mg by mouth daily.     PARoxetine 40 MG tablet  Commonly known  as:  PAXIL  Take 40 mg by mouth daily. Reported on 01/18/2016     polyethylene glycol packet  Commonly known as:  MIRALAX / GLYCOLAX  Take 17 g by mouth daily.     potassium chloride SA 20 MEQ tablet  Commonly known as:  K-DUR,KLOR-CON  Take 40 mEq by mouth daily.     sennosides-docusate sodium 8.6-50 MG tablet  Commonly known as:  SENOKOT-S  Take 2 tablets by mouth at bedtime.     VITAMIN B COMPLEX PO  Take 1 tablet by mouth daily.     vitamin B-12 1000 MCG tablet  Commonly known as:  CYANOCOBALAMIN  Take 1,000 mcg by mouth at bedtime.     vitamin E 400 UNIT capsule  Take 400 Units by mouth daily.        Review of Systems  Constitutional: Negative for fever, chills, diaphoresis, activity change, appetite change, fatigue and unexpected weight change.       Obese  HENT: Negative for congestion, ear discharge, ear pain, hearing loss, postnasal drip, rhinorrhea, sore throat, tinnitus, trouble swallowing and voice change.   Eyes: Negative for pain, redness, itching and visual disturbance.  Respiratory: Negative for cough, choking, shortness of breath and wheezing.   Cardiovascular: Positive for leg swelling (1+ bipedal). Negative for chest pain and palpitations.  Gastrointestinal: Positive for abdominal pain (lower). Negative for nausea, diarrhea, constipation and abdominal distention.  Endocrine: Negative for cold intolerance, heat intolerance, polydipsia, polyphagia and polyuria.  Genitourinary: Negative for dysuria, urgency, frequency, hematuria, flank pain, vaginal discharge, difficulty urinating and  pelvic pain.  Musculoskeletal: Positive for back pain. Negative for myalgias (10. Muscles bilaterally), arthralgias, gait problem, neck pain and neck stiffness.       Using back brace.  Skin: Negative for color change, pallor and rash.       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 Dark band of skin across the dorsum of the right foot.  Allergic/Immunologic: Negative.     Neurological: Negative for dizziness, tremors, seizures, syncope, weakness, numbness and headaches.       Has right buttock numbness   Hematological: Negative for adenopathy. Does not bruise/bleed easily.  Psychiatric/Behavioral: Negative for suicidal ideas, hallucinations, behavioral problems, confusion, sleep disturbance, dysphoric mood and agitation. The patient is not nervous/anxious and is not hyperactive.     Filed Vitals:   02/06/16 1514  BP: 155/91  Pulse: 92  Temp: 99 F (37.2 C)  Height: 5\' 6"  (1.676 m)  Weight: 266 lb (120.657 kg)  SpO2: 96%   Body mass index is 42.95 kg/(m^2). Physical Exam  Constitutional: She is oriented to person, place, and time. She appears well-nourished.  Obese  HENT:  Head: Normocephalic.  Eyes: EOM are normal. Pupils are equal, round, and reactive to light.  Neck: Normal range of motion.  Cardiovascular: Normal rate and regular rhythm.   Pulmonary/Chest: Effort normal and breath sounds normal.  Abdominal: Soft. Bowel sounds are normal. There is tenderness (Lower abdomen).  Musculoskeletal: Normal range of motion. She exhibits tenderness (Both calves).  Neurological: She is oriented to person, place, and time.  Skin: Skin is warm and dry.  Lower back incision site intact without any redness, tenderness or drainage. Back brace in place. Dark band of skin across the dorsum of the right foot. 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  Psychiatric: She has a normal mood and affect.    Labs reviewed: Basic Metabolic Panel:  Recent Labs  01/28/16 1736 01/30/16 1652 01/31/16 0456  NA 138 135 140  K 3.6 3.1* 3.4*  CL 103 98* 102  CO2 26 28 29   GLUCOSE 117* 123* 130*  BUN 18 12 12   CREATININE 1.09* 0.93 0.87  CALCIUM 9.2 9.2 8.9  MG  --   --  2.0   Liver Function Tests:  Recent Labs  01/10/16 01/28/16 1736 01/30/16 1652  AST 30 17 17   ALT 48* 16 13*  ALKPHOS 173* 115 115  BILITOT  --  0.6 0.7  PROT  --  7.4 7.6  ALBUMIN   --  3.6 3.6    Recent Labs  01/30/16 1652  LIPASE 28   No results for input(s): AMMONIA in the last 8760 hours. CBC:  Recent Labs  10/05/15  01/05/16 1334  01/28/16 1736 01/30/16 1652 01/31/16 0456  WBC  --   < > 11.1*  < > 7.3 5.9 5.1  NEUTROABS 43  --  7.0  --  4.7  --   --   HGB 10.1*  < > 10.6*  < > 11.7* 11.2* 11.4*  HCT 32*  < > 33.8*  < > 35.2 34.3* 34.6*  MCV  --   < > 89.2  --  85.4 86.4 86.4  PLT 415*  < > 285  < > 329 312 220  < > = values in this interval not displayed. Cardiac Enzymes:  Recent Labs  01/30/16 1652  TROPONINI 0.03   BNP: Invalid input(s): POCBNP No results found for: HGBA1C No results found for: TSH No results found for: VITAMINB12 No results found  for: FOLATE No results found for: IRON, TIBC, FERRITIN  Imaging and Procedures obtained prior to SNF admission: Dg Abd 2 Views  01/30/2016  CLINICAL DATA:  Abdominal pain with nausea and vomiting for several weeks. EXAM: ABDOMEN - 2 VIEW COMPARISON:  None. FINDINGS: No evidence of dilated bowel loops or free air. Residual contrast seen within the urinary tract from recent CT. Lumbar spine fusion hardware again noted. IMPRESSION: Normal bowel gas pattern.  No acute findings. Electronically Signed   By: Earle Gell M.D.   On: 01/30/2016 19:55   Ct Cta Abd/pel W/cm &/or W/o Cm  01/30/2016  CLINICAL DATA:  70 year old female with continued pelvic and lower abdominal pain. Patient was seen in the ER 2 days ago with diagnosis of bladder infection. Clinical concern for aortic dissection. EXAM: CTA ABDOMEN AND PELVIS wITHOUT AND WITH CONTRAST TECHNIQUE: Multidetector CT imaging of the abdomen and pelvis was performed using the standard protocol during bolus administration of intravenous contrast. Multiplanar reconstructed images and MIPs were obtained and reviewed to evaluate the vascular anatomy. CONTRAST:  100 cc Isovue 370 COMPARISON:  CT dated 01/28/2016 FINDINGS: Minimal left lung base atelectatic changes.  Focal area of linear scarring noted in the lingula. There is a 7 mm ground-glass nodular density in the right lower lobe (series 4 image 15). Adenocarcinoma cannot be excluded. Follow up by CT is recommended in 12 months, with continued annual surveillance for a minimum of 3 years. No intra-abdominal free air or free fluid. Cholecystectomy. The liver, pancreas, spleen, and the right adrenal gland appear unremarkable. There is a 1.5 cm indeterminate left adrenal nodule, possibly an adenoma. The kidneys, visualized ureters, and urinary bladder appear unremarkable. The hysterectomy. Moderate stool noted throughout the colon. There is no evidence of bowel obstruction or active inflammation. The appendix is not visualized with certainty. No inflammatory changes identified in the right lower quadrant. There is moderate aortoiliac atherosclerotic disease. There is no evidence of aortic dissection or aneurysm. The origins of the celiac axis, SMA, IMA as well as the origins of the renal arteries appear patent. The IVC and the portal vein appear unremarkable. No portal venous gas identified. There is no adenopathy. Midline vertical anterior pelvic wall incisional scar noted. There is multilevel postsurgical changes of the lumbar spine including intervertebral cage at L1-L2 and L3-L4. L4-L5 new L5-S1 anterior fixation plate and screws caps noted. The left pedicular screw noted at L1. There is laminectomy with bilateral posterior fixation screws at L2 and L3. There is postsurgical changes and inflammation of the paraspinal soft tissue and musculature. There is a 2.1 x 6.9 cm complex appearing fluid collection at the operative bed extending from the posterior elements to the subcutaneous soft tissues of the lower back at the level of L1-L3. This likely represents a postsurgical seroma. An infected fluid or developing abscess is not excluded. Clinical correlation is recommended. No acute fracture identified. Review of the MIP  images confirms the above findings. IMPRESSION: No acute intra-abdominal pelvic pathology. Specifically there is no evidence of aortic dissection or aneurysm. A 7 mm ground-glass nodular density at the right lung base. Adenocarcinoma cannot be excluded. Follow up by CT is recommended in 12 months, with continued annual surveillance for a minimum of 3 years.These recommendations are taken from:Recommendations for the Management of Subsolid Pulmonary Nodules Detected at CT: A Statement from the [facility] 2013; 266:1, [phone]. Postsurgical changes of lumbar spine as described with a small fluid collection Set in the surgical bed, likely postoperative  seroma. Electronically Signed   By: Anner Crete M.D.   On: 01/30/2016 19:15    Assessment/Plan 1. Abdominal pain of unknown etiology I am concerned that her pains may be connected to the fluid collection noted on the CT of the abd 01/30/16  2. Spinal stenosis of lumbar region Still recovering from back surgery in April 2017. Had a redo of prior fusion.  3. Essential hypertension Elevated. Will continue to monitor. May need medication adjustment.

## 2016-02-07 ENCOUNTER — Other Ambulatory Visit: Payer: Self-pay | Admitting: Neurosurgery

## 2016-02-07 DIAGNOSIS — M5137 Other intervertebral disc degeneration, lumbosacral region: Secondary | ICD-10-CM

## 2016-02-08 ENCOUNTER — Ambulatory Visit
Admission: RE | Admit: 2016-02-08 | Discharge: 2016-02-08 | Disposition: A | Discharge status: Home / Self Care | Payer: Medicare Other | Source: Ambulatory Visit | Attending: Neurosurgery | Admitting: Neurosurgery

## 2016-02-08 DIAGNOSIS — R102 Pelvic and perineal pain: Secondary | ICD-10-CM | POA: Diagnosis not present

## 2016-02-08 DIAGNOSIS — M5137 Other intervertebral disc degeneration, lumbosacral region: Secondary | ICD-10-CM

## 2016-02-08 DIAGNOSIS — M51379 Other intervertebral disc degeneration, lumbosacral region without mention of lumbar back pain or lower extremity pain: Secondary | ICD-10-CM

## 2016-02-13 ENCOUNTER — Non-Acute Institutional Stay (SKILLED_NURSING_FACILITY): Payer: Medicare Other | Admitting: Family

## 2016-02-13 DIAGNOSIS — R109 Unspecified abdominal pain: Secondary | ICD-10-CM

## 2016-02-13 DIAGNOSIS — M545 Low back pain, unspecified: Secondary | ICD-10-CM

## 2016-02-13 DIAGNOSIS — G8929 Other chronic pain: Secondary | ICD-10-CM

## 2016-02-13 DIAGNOSIS — R252 Cramp and spasm: Secondary | ICD-10-CM

## 2016-02-13 NOTE — Progress Notes (Signed)
Patient ID: Amy [name], female   DOB: 1946-01-09, 69 y.o.   MRN: [id number]  Location:  [facility]:  SNF (31) Provider:  Dinah Ngetich FNP-C   Blanchie Serve, MD  Patient Care Team: Blanchie Serve, MD as PCP - General (Internal Medicine)  Extended Emergency Contact Information Primary Emergency Contact: [name] Address: [address]          [place], [place] [address] [name] of [place] Phone: [phone] Mobile Phone: [phone] Relation: Spouse Secondary Emergency Contact: [name] Address: [address]          [place], [place] [address] [name] of [place] Phone: [phone] Mobile Phone: [phone] Relation: Daughter  Code Status: Full Code  Goals of care: Advanced Directive information Advanced Directives 02/06/2016  Does patient have an advance directive? No  Does patient want to make changes to advanced directive? -  Would patient like information on creating an advanced directive? -     Chief Complaint  Patient presents with  . Acute Visit    pain     HPI:  Pt is a 70 y.o. female seen today at [facility] for an acute visit for Abdominal and lower back pain. She is seen in her room today. She states her current pain medication ease off the pain but has to wait too long prior to the next due dose. Abdominal pain worst with movement. She has a follow up appointment with GI specialist Dr. Robina Ade 02/14/2016. She denies any fever, chills, constipation, diarrhea, Nausea or vomiting.Appetite described as good.    Past Medical History  Diagnosis Date  . Anxiety   . Dysphonia     intermittent "voice changes"  . Chronic back pain     lumbar steroid injection's  . Seasonal allergies   . Fibromyalgia   . Weakness     both hands and feet  . Urinary frequency   . Glaucoma   . History of MRSA infection 2011  . History of DVT (deep vein thrombosis) 1989    LEFT  UPPER ARM  . Hypertension   . Short of breath on exertion   . Depression   . GERD (gastroesophageal reflux disease)   . Pelvic pain in female   . Arthritis     ddd- RA  . Irregular heart rate     "years ago"  . Peripheral vascular disease ([place])     "poor circulation"  . Mild obstructive sleep apnea     per study 02-07-2006 - no cpap  . Asthma     "sleeping asthma"  . History of hiatal hernia   . Headache   . Neuropathy (HCC)     feet  . Low iron   . Constipation    Past Surgical History  Procedure Laterality Date  . Abdominal hysterectomy  1978  . Nose surgery  2007  . Mass excision Left 02/20/2013    Procedure: EXCISION LEFT BUTTOCK  MASS;  Surgeon: Adin Hector, MD;  Location: [facility];  Service: General;  Laterality: Left;  . Esophagogastroduodenoscopy N/A 07/15/2013    Procedure: ESOPHAGOGASTRODUODENOSCOPY (EGD);  Surgeon: Winfield Cunas., MD;  Location: [facility];  Service: Endoscopy;  Laterality: N/A;  need xray  . Abdominal adhesions removed    . Colonoscopy    . Bunionectomy Left 2008  . Shoulder open rotator cuff repair Left 05/05/2014    Procedure: OPEN ACROMIONECTOMY AND OPEN REPAIR OF ROTATOR  CUFF, TISSUEMEND GRAFT WITH ANCHOR ;  Surgeon: Tobi Bastos, MD;  Location: [facility];  Service: Orthopedics;  Laterality: Left;  . Esophageal manometry N/A 11/22/2014    Procedure: ESOPHAGEAL MANOMETRY (EM);  Surgeon: Winfield Cunas., MD;  Location: [facility];  Service: Endoscopy;  Laterality: N/A;  . Lumbar fusion  2014    L4 -- L5  . Removal hardware l4-l5/  bilateral laminectomy l2 - l5 and fusion  06-12-2011  . Laparoscopic cholecystectomy  01-11-2006  . Cysto with hydrodistension N/A 07/12/2015    Procedure: CYSTOSCOPY/HYDRODISTENSION;  Surgeon: Bjorn Loser, MD;  Location: [facility];  Service: Urology;  Laterality: N/A;  . Eye surgery Bilateral     cataract surgery with lens implants  . Tonsillectomy    . Hardware removal N/A  01/02/2016    Procedure: Exploration of Lumbar Fusion,Removal of hardware Lumbar One-Two ;Redo Posterior Lumbar Fusion Lumbar One-Two;  Surgeon: Kary Kos, MD;  Location: [facility];  Service: Neurosurgery;  Laterality: N/A;    Allergies  Allergen Reactions  . Latex Other (See Comments)    Sores, blisters  . Penicillins Rash    UNKNOWN but able to take AMPICILLIN       Medication List       This list is accurate as of: 02/13/16 12:52 PM.  Always use your most recent med list.               CALCIUM-VITAMIN D3 PO  Take 1 tablet by mouth daily.     cloNIDine 0.1 MG tablet  Commonly known as:  CATAPRES  Take 0.1 mg by mouth 2 (two) times daily. For Systolic > 99991111     dicyclomine 20 MG tablet  Commonly known as:  BENTYL  Take 20 mg by mouth every 6 (six) hours.     diphenhydrAMINE 25 mg capsule  Commonly known as:  BENADRYL  Take 25 mg by mouth 2 (two) times daily as needed for allergies.     ESTRACE VAGINAL 0.1 MG/GM vaginal cream  Generic drug:  estradiol  Place 1 application vaginally 2 (two) times a week.     gabapentin 100 MG capsule  Commonly known as:  NEURONTIN  Take 300 mg by mouth at bedtime.     glycopyrrolate 2 MG tablet  Commonly known as:  ROBINUL  Take 1 mg by mouth 2 (two) times daily.     hydrALAZINE 25 MG tablet  Commonly known as:  APRESOLINE  Take 25 mg by mouth 3 (three) times daily.     losartan-hydrochlorothiazide 100-25 MG tablet  Commonly known as:  HYZAAR  Take 1 tablet by mouth every morning. Reported on 01/31/2016     METANX PO  Take 1 tablet by mouth 2 (two) times daily.     multivitamin with minerals Tabs tablet  Take 1 tablet by mouth daily.     NIFEdipine 10 MG capsule  Commonly known as:  PROCARDIA  Take 10 mg by mouth 3 (three) times daily.     oxyCODONE-acetaminophen 5-325 MG tablet  Commonly known as:  PERCOCET/ROXICET  Take 1 tablet by mouth every 8 (eight) hours as needed for moderate pain.     pantoprazole 40 MG  tablet  Commonly known as:  PROTONIX  Take 40 mg by mouth daily.     PARoxetine 40 MG tablet  Commonly known as:  PAXIL  Take 40 mg by mouth daily. Reported on 01/18/2016     polyethylene glycol packet  Commonly  known as:  MIRALAX / GLYCOLAX  Take 17 g by mouth daily.     potassium chloride SA 20 MEQ tablet  Commonly known as:  K-DUR,KLOR-CON  Take 40 mEq by mouth daily.     sennosides-docusate sodium 8.6-50 MG tablet  Commonly known as:  SENOKOT-S  Take 2 tablets by mouth at bedtime.     triamcinolone 0.025 % cream  Commonly known as:  KENALOG  Apply 1 application topically 2 (two) times daily.     VITAMIN B COMPLEX PO  Take 1 tablet by mouth daily.     vitamin B-12 1000 MCG tablet  Commonly known as:  CYANOCOBALAMIN  Take 1,000 mcg by mouth at bedtime.     vitamin E 400 UNIT capsule  Take 400 Units by mouth daily.        Review of Systems  Constitutional: Negative for fever, chills, activity change, appetite change and fatigue.  HENT: Negative for congestion, rhinorrhea, sinus pressure, sneezing and sore throat.   Eyes: Negative.   Respiratory: Negative.   Cardiovascular: Negative.   Gastrointestinal: Positive for abdominal pain. Negative for nausea, vomiting, diarrhea, constipation and abdominal distention.  Genitourinary: Negative for dysuria, urgency, frequency and flank pain.  Musculoskeletal: Positive for back pain and gait problem.  Skin: Negative.   Psychiatric/Behavioral: Negative.     Immunization History  Administered Date(s) Administered  . PPD Test 10/03/2015, 01/06/2016, 01/16/2016, 02/01/2016   Pertinent  Health Maintenance Due  Topic Date Due  . COLONOSCOPY  01/23/1996  . DEXA SCAN  01/23/2011  . PNA vac Low Risk Adult (1 of 2 - PCV13) 01/23/2011  . INFLUENZA VACCINE  04/24/2016  . MAMMOGRAM  11/28/2017   No flowsheet data found. Functional Status Survey:    Filed Vitals:   02/13/16 1240  BP: 123/67  Pulse: 95  Temp: 97.7 F (36.5  C)  Resp: 18  Height: 5\' 5"  (1.651 m)  Weight: 266 lb (120.657 kg)  SpO2: 98%   Body mass index is 44.26 kg/(m^2). Physical Exam  Constitutional: She is oriented to person, place, and time. She appears well-developed and well-nourished. No distress.  HENT:  Head: Normocephalic.  Mouth/Throat: Oropharynx is clear and moist.  Eyes: Conjunctivae and EOM are normal. Pupils are equal, round, and reactive to light. Right eye exhibits no discharge. Left eye exhibits no discharge. No scleral icterus.  Neck: Normal range of motion. No JVD present. No thyromegaly present.  Cardiovascular: Normal rate, regular rhythm, normal heart sounds and intact distal pulses.  Exam reveals no gallop and no friction rub.   No murmur heard. Pulmonary/Chest: Effort normal and breath sounds normal. No respiratory distress. She has no wheezes. She has no rales.  Abdominal: Soft. Bowel sounds are normal. She exhibits no distension. There is no tenderness. There is no rebound and no guarding.  Musculoskeletal: She exhibits no edema or tenderness.  Unsteady gait   Lymphadenopathy:    She has no cervical adenopathy.  Neurological: She is oriented to person, place, and time.  Skin: Skin is warm and dry. No rash noted. No erythema. No pallor.  Psychiatric: She has a normal mood and affect.    Labs reviewed:  Recent Labs  01/28/16 1736 01/30/16 1652 01/31/16 0456  NA 138 135 140  K 3.6 3.1* 3.4*  CL 103 98* 102  CO2 26 28 29   GLUCOSE 117* 123* 130*  BUN 18 12 12   CREATININE 1.09* 0.93 0.87  CALCIUM 9.2 9.2 8.9  MG  --   --  2.0    Recent Labs  01/10/16 01/28/16 1736 01/30/16 1652  AST 30 17 17   ALT 48* 16 13*  ALKPHOS 173* 115 115  BILITOT  --  0.6 0.7  PROT  --  7.4 7.6  ALBUMIN  --  3.6 3.6    Recent Labs  10/05/15  01/05/16 1334  01/28/16 1736 01/30/16 1652 01/31/16 0456  WBC  --   < > 11.1*  < > 7.3 5.9 5.1  NEUTROABS 43  --  7.0  --  4.7  --   --   HGB 10.1*  < > 10.6*  < > 11.7*  11.2* 11.4*  HCT 32*  < > 33.8*  < > 35.2 34.3* 34.6*  MCV  --   < > 89.2  --  85.4 86.4 86.4  PLT 415*  < > 285  < > 329 312 220  < > = values in this interval not displayed. No results found for: TSH No results found for: HGBA1C No results found for: CHOL, HDL, LDLCALC, LDLDIRECT, TRIG, CHOLHDL  Significant Diagnostic Results in last 30 days:  Mr Pelvis Wo Contrast  02/08/2016  CLINICAL DATA:  Severe anterior pelvic pain radiating into the right and left groin for about 2 weeks. Patient status post lower lumbar fusion 09/28/2015. EXAM: MRI PELVIS WITHOUT CONTRAST TECHNIQUE: Multiplanar multisequence MR imaging of the pelvis was performed. No intravenous contrast was administered. COMPARISON:  CT abdomen and pelvis 01/30/2016. FINDINGS: Artifact from lower lumbar fusion hardware is noted. The patient has marked degenerative change about the symphysis pubis with bony hypertrophy and subchondral edema about the joint. Degenerative change is also seen about the sacroiliac joints bilaterally and appears somewhat worse on the left where a few tiny subchondral cysts are identified. Bone marrow signal is otherwise unremarkable. No fracture is identified. There is no avascular necrosis of the femoral heads. Imaged pelvic musculature is intact and normal appearance. No evidence of bursitis is seen. No hernia is identified. Imaged intrapelvic contents demonstrate postoperative change of hysterectomy. No lymphadenopathy or fluid collection is identified. IMPRESSION: No acute abnormality. 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 moderate bilateral SI joint degenerative disease appears worse on the left. Status post lower lumbar fusion and hysterectomy. Electronically Signed   By: Inge Rise M.D.   On: 02/08/2016 13:39   Dg Abd 2 Views  01/30/2016  CLINICAL DATA:  Abdominal pain with nausea and vomiting for several weeks. EXAM: ABDOMEN - 2 VIEW COMPARISON:  None. FINDINGS: No evidence  of dilated bowel loops or free air. Residual contrast seen within the urinary tract from recent CT. Lumbar spine fusion hardware again noted. IMPRESSION: Normal bowel gas pattern.  No acute findings. Electronically Signed   By: Earle Gell M.D.   On: 01/30/2016 19:55   Ct Renal Stone Study  01/28/2016  CLINICAL DATA:  70 year old female with bilateral lower abdominal and pelvic for 2 days with dysuria and urgency. Reported fever. Recent lumbar surgery performed on 01/02/2016. EXAM: CT ABDOMEN AND PELVIS WITHOUT CONTRAST TECHNIQUE: Multidetector CT imaging of the abdomen and pelvis was performed following the standard protocol without IV contrast. COMPARISON:  None. FINDINGS: Please note that parenchymal abnormalities may be missed without intravenous contrast. Lower chest:  Cardiomegaly identified without acute abnormality. Hepatobiliary: The liver is unremarkable. The patient is status post cholecystectomy. There is no evidence of biliary dilatation. Pancreas: Unremarkable. Spleen: Unremarkable. Adrenals/Urinary Tract: The kidneys, right adrenal gland and bladder are unremarkable. There is no evidence of  hydronephrosis or urinary calculi. A 1.5 cm probable left adrenal adenoma noted. Stomach/Bowel: Unremarkable. There is no evidence of bowel obstruction or definite focal bowel wall thickening. Vascular/Lymphatic: Aortic atherosclerotic calcifications noted without aneurysm. No enlarged lymph nodes identified. Reproductive: Patient is status post hysterectomy. No adnexal masses identified. Other: No free fluid or pneumoperitoneum. Musculoskeletal: Pedicular and posterior rod fixation is identified on the right from L2-L4 and on the left from L1-L4. Intervertebral cages at L1-2 and L3-4 noted. Anterior plate and screw fixation at L4-5 and L5-S1 noted. Moderate posterior soft tissue density/stranding in the lumbar region identified. IMPRESSION: No evidence of acute abnormality within the abdomen or pelvis.  Postoperative changes within the lumbar spine as described. Consider further evaluation if there is strong clinical history for infection. Cardiomegaly and aortic atherosclerosis. Electronically Signed   By: Margarette Canada M.D.   On: 01/28/2016 18:39   Ct Cta Abd/pel W/cm &/or W/o Cm  01/30/2016  CLINICAL DATA:  70 year old female with continued pelvic and lower abdominal pain. Patient was seen in the ER 2 days ago with diagnosis of bladder infection. Clinical concern for aortic dissection. EXAM: CTA ABDOMEN AND PELVIS wITHOUT AND WITH CONTRAST TECHNIQUE: Multidetector CT imaging of the abdomen and pelvis was performed using the standard protocol during bolus administration of intravenous contrast. Multiplanar reconstructed images and MIPs were obtained and reviewed to evaluate the vascular anatomy. CONTRAST:  100 cc Isovue 370 COMPARISON:  CT dated 01/28/2016 FINDINGS: Minimal left lung base atelectatic changes. Focal area of linear scarring noted in the lingula. There is a 7 mm ground-glass nodular density in the right lower lobe (series 4 image 15). Adenocarcinoma cannot be excluded. Follow up by CT is recommended in 12 months, with continued annual surveillance for a minimum of 3 years. No intra-abdominal free air or free fluid. Cholecystectomy. The liver, pancreas, spleen, and the right adrenal gland appear unremarkable. There is a 1.5 cm indeterminate left adrenal nodule, possibly an adenoma. The kidneys, visualized ureters, and urinary bladder appear unremarkable. The hysterectomy. Moderate stool noted throughout the colon. There is no evidence of bowel obstruction or active inflammation. The appendix is not visualized with certainty. No inflammatory changes identified in the right lower quadrant. There is moderate aortoiliac atherosclerotic disease. There is no evidence of aortic dissection or aneurysm. The origins of the celiac axis, SMA, IMA as well as the origins of the renal arteries appear patent. The  IVC and the portal vein appear unremarkable. No portal venous gas identified. There is no adenopathy. Midline vertical anterior pelvic wall incisional scar noted. There is multilevel postsurgical changes of the lumbar spine including intervertebral cage at L1-L2 and L3-L4. L4-L5 new L5-S1 anterior fixation plate and screws caps noted. The left pedicular screw noted at L1. There is laminectomy with bilateral posterior fixation screws at L2 and L3. There is postsurgical changes and inflammation of the paraspinal soft tissue and musculature. There is a 2.1 x 6.9 cm complex appearing fluid collection at the operative bed extending from the posterior elements to the subcutaneous soft tissues of the lower back at the level of L1-L3. This likely represents a postsurgical seroma. An infected fluid or developing abscess is not excluded. Clinical correlation is recommended. No acute fracture identified. Review of the MIP images confirms the above findings. IMPRESSION: No acute intra-abdominal pelvic pathology. Specifically there is no evidence of aortic dissection or aneurysm. A 7 mm ground-glass nodular density at the right lung base. Adenocarcinoma cannot be excluded. Follow up by CT is recommended in  12 months, with continued annual surveillance for a minimum of 3 years.These recommendations are taken from:Recommendations for the Management of Subsolid Pulmonary Nodules Detected at CT: A Statement from the [facility] 2013; 266:1, [phone]. Postsurgical changes of lumbar spine as described with a small fluid collection Set in the surgical bed, likely postoperative seroma. Electronically Signed   By: Anner Crete M.D.   On: 01/30/2016 19:15    Assessment/Plan 1. Abdominal pain of unknown etiology Pain worse with movement. Has appointment with Robina Ade 02/14/2016. Increase oxycodone 5/325 mg Tablet to 1-2 Tablets by mouth every 6 hours as needed one tablet for moderate pain and 2 tablets for severe pain.     2. Chronic bilateral low back pain without sciatica Increase oxycodone 5/325 mg Tablet to 1-2 Tablets by mouth every 6 hours as needed one tablet for moderate pain and 2 tablets for severe pain.   3. Bilateral leg cramps Worst at night. Obtain BMP and Magnesium level 02/14/2016     Family/ staff Communication: Reviewed plan of care with patient and facility Nurse.  Labs/tests ordered: BMP and Magnesium level 02/14/2016

## 2016-02-14 ENCOUNTER — Other Ambulatory Visit: Payer: Self-pay | Admitting: Neurosurgery

## 2016-02-14 DIAGNOSIS — M5136 Other intervertebral disc degeneration, lumbar region: Secondary | ICD-10-CM

## 2016-02-14 LAB — BASIC METABOLIC PANEL
BUN: 13 mg/dL (ref 4–21)
Creatinine: 0.9 mg/dL (ref 0.5–1.1)
Glucose: 103 mg/dL
Potassium: 4.1 mmol/L (ref 3.4–5.3)
Sodium: 140 mmol/L (ref 137–147)

## 2016-02-24 ENCOUNTER — Non-Acute Institutional Stay (SKILLED_NURSING_FACILITY): Payer: Medicare Other | Admitting: Family

## 2016-02-24 ENCOUNTER — Encounter: Payer: Self-pay | Admitting: Family

## 2016-02-24 ENCOUNTER — Ambulatory Visit
Admission: RE | Admit: 2016-02-24 | Discharge: 2016-02-24 | Disposition: A | Discharge status: Home / Self Care | Payer: Medicare Other | Source: Ambulatory Visit | Attending: Neurosurgery | Admitting: Neurosurgery

## 2016-02-24 DIAGNOSIS — R109 Unspecified abdominal pain: Secondary | ICD-10-CM | POA: Diagnosis not present

## 2016-02-24 DIAGNOSIS — F32A Depression, unspecified: Secondary | ICD-10-CM

## 2016-02-24 DIAGNOSIS — K219 Gastro-esophageal reflux disease without esophagitis: Secondary | ICD-10-CM | POA: Diagnosis not present

## 2016-02-24 DIAGNOSIS — M5136 Other intervertebral disc degeneration, lumbar region: Secondary | ICD-10-CM

## 2016-02-24 DIAGNOSIS — R269 Unspecified abnormalities of gait and mobility: Secondary | ICD-10-CM

## 2016-02-24 DIAGNOSIS — G5791 Unspecified mononeuropathy of right lower limb: Secondary | ICD-10-CM

## 2016-02-24 DIAGNOSIS — F329 Major depressive disorder, single episode, unspecified: Secondary | ICD-10-CM

## 2016-02-24 DIAGNOSIS — I10 Essential (primary) hypertension: Secondary | ICD-10-CM

## 2016-02-24 DIAGNOSIS — G5793 Unspecified mononeuropathy of bilateral lower limbs: Secondary | ICD-10-CM

## 2016-02-24 DIAGNOSIS — G5792 Unspecified mononeuropathy of left lower limb: Secondary | ICD-10-CM | POA: Diagnosis not present

## 2016-02-24 DIAGNOSIS — M4326 Fusion of spine, lumbar region: Secondary | ICD-10-CM | POA: Diagnosis not present

## 2016-02-24 MED ORDER — GADOBENATE DIMEGLUMINE 529 MG/ML IV SOLN
10.0000 mL | Freq: Once | INTRAVENOUS | Status: AC | PRN
  Administered 2016-02-24: 10 mL via INTRAVENOUS

## 2016-02-24 NOTE — Progress Notes (Signed)
Patient ID: Amy [name], female   DOB: 17-Jun-1946, 70 y.o.   MRN: [id number]  Location:  [facility] Room Number: [id number] Place of Service:  SNF (31)  Provider: Marlowe Sax FNP-C  PCP: Blanchie Serve, MD Patient Care Team: Blanchie Serve, MD as PCP - General (Internal Medicine)  Extended Emergency Contact Information Primary Emergency Contact: [name] Address: [address]          [place], [place] [address] [name] of [place] Phone: [phone] Mobile Phone: [phone] Relation: Spouse Secondary Emergency Contact: [name] Address: [address]          [place], [place] [address] [name] of [place] Phone: [phone] Mobile Phone: [phone] Relation: Daughter  Code Status: DNR  Goals of care:  Advanced Directive information Advanced Directives 02/24/2016  Does patient have an advance directive? Yes  Type of Advance Directive Out of facility DNR (pink MOST or yellow form)  Does patient want to make changes to advanced directive? No - Patient declined  Copy of advanced directive(s) in chart? Yes  Would patient like information on creating an advanced directive? -     Allergies  Allergen Reactions  . Latex Other (See Comments)    Sores, blisters  . Penicillins Rash    UNKNOWN but able to take AMPICILLIN     Chief Complaint  Patient presents with  . Discharge Note    HPI:  70 y.o. female seen today at  [facility] for discharge home. She was here for short rehabilitation post hospital admission from 01/30/2016-02/01/2016 for abdominal pain, leg and back pain. She was also treated with I.V Rocephin for UTI then discharge on oral Cipro. Hypokalemia resolved with supplement. She has a medical history of HTN, Obese, Depression, Lumbago, Fibromyalgia, Dysphonia, GERD, PVD among others. She is seen in her room today. She states had an MRI today as referred 02/14/16 by [facility] Dr. Saintclair Halsted. She is awaiting Ortho appointment.She continues to complain of episodic lower abdominal pain; current regimen eases off the pain. She has worked with PT/OT now stable for discharge home. She will be discharged with Home health PT/OT to continue with ROM, Exercise, Gait stability and muscle strengthening. She does not require any DME.Home health services will be arranged by facility social worker prior to discharge. Prescription medication will be written x 1 month then patient to follow up with PCP in 1-2 weeks.     Past Medical History  Diagnosis Date  . Anxiety   . Dysphonia     intermittent "voice changes"  . Chronic back pain     lumbar steroid injection's  . Seasonal allergies   . Fibromyalgia   . Weakness     both hands and feet  . Urinary frequency   . Glaucoma   . History of MRSA infection 2011  . History of DVT (deep vein thrombosis) 1989    LEFT UPPER ARM  . Hypertension   . Short of breath on exertion   . Depression   . GERD (gastroesophageal reflux disease)   . Pelvic pain in female   . Arthritis     ddd- RA  . Irregular heart rate     "years ago"  . Peripheral vascular disease ([place])     "poor circulation"  . Mild obstructive sleep apnea     per study 02-07-2006 - no cpap  . Asthma     "sleeping asthma"  . History  of hiatal hernia   . Headache   . Neuropathy (HCC)     feet  . Low iron   . Constipation     Past Surgical History  Procedure Laterality Date  . Abdominal hysterectomy  1978  . Nose surgery  2007  . Mass excision Left 02/20/2013    Procedure: EXCISION LEFT BUTTOCK  MASS;  Surgeon: Adin Hector, MD;  Location: [facility];  Service: General;  Laterality: Left;  . Esophagogastroduodenoscopy N/A 07/15/2013    Procedure: ESOPHAGOGASTRODUODENOSCOPY (EGD);  Surgeon: Winfield Cunas., MD;  Location: [facility];  Service: Endoscopy;  Laterality: N/A;  need xray  . Abdominal adhesions removed    . Colonoscopy    .  Bunionectomy Left 2008  . Shoulder open rotator cuff repair Left 05/05/2014    Procedure: OPEN ACROMIONECTOMY AND OPEN REPAIR OF ROTATOR CUFF, TISSUEMEND GRAFT WITH ANCHOR ;  Surgeon: Tobi Bastos, MD;  Location: [facility];  Service: Orthopedics;  Laterality: Left;  . Esophageal manometry N/A 11/22/2014    Procedure: ESOPHAGEAL MANOMETRY (EM);  Surgeon: Winfield Cunas., MD;  Location: [facility];  Service: Endoscopy;  Laterality: N/A;  . Lumbar fusion  2014    L4 -- L5  . Removal hardware l4-l5/  bilateral laminectomy l2 - l5 and fusion  06-12-2011  . Laparoscopic cholecystectomy  01-11-2006  . Cysto with hydrodistension N/A 07/12/2015    Procedure: CYSTOSCOPY/HYDRODISTENSION;  Surgeon: Bjorn Loser, MD;  Location: [facility];  Service: Urology;  Laterality: N/A;  . Eye surgery Bilateral     cataract surgery with lens implants  . Tonsillectomy    . Hardware removal N/A 01/02/2016    Procedure: Exploration of Lumbar Fusion,Removal of hardware Lumbar One-Two ;Redo Posterior Lumbar Fusion Lumbar One-Two;  Surgeon: Kary Kos, MD;  Location: [facility];  Service: Neurosurgery;  Laterality: N/A;      reports that she quit smoking about 46 years ago. She has never used smokeless tobacco. She reports that she drinks alcohol. She reports that she does not use illicit drugs. Social History   Social History  . Marital Status: Married    Spouse Name: N/A  . Number of Children: N/A  . Years of Education: N/A   Occupational History  . Not on file.   Social History Main Topics  . Smoking status: Former Smoker    Quit date: 07/10/1969  . Smokeless tobacco: Never Used  . Alcohol Use: Yes     Comment: rare  . Drug Use: No  . Sexual Activity: Not on file   Other Topics Concern  . Not on file   Social History Narrative   Functional Status Survey:    Allergies  Allergen Reactions  . Latex Other (See Comments)    Sores, blisters  . Penicillins Rash    UNKNOWN  but able to take AMPICILLIN     Pertinent  Health Maintenance Due  Topic Date Due  . COLONOSCOPY  01/23/1996  . DEXA SCAN  01/23/2011  . PNA vac Low Risk Adult (1 of 2 - PCV13) 09/24/2016 (Originally 01/23/2011)  . INFLUENZA VACCINE  04/24/2016  . MAMMOGRAM  11/28/2017    Medications:   Medication List       This list is accurate as of: 02/24/16  7:25 PM.  Always use your most recent med list.               CALCIUM-VITAMIN D3 PO  Take 1 tablet by mouth daily.  cloNIDine 0.1 MG tablet  Commonly known as:  CATAPRES  Take 0.1 mg by mouth 2 (two) times daily. For Systolic > 99991111     dicyclomine 20 MG tablet  Commonly known as:  BENTYL  Take 20 mg by mouth every 6 (six) hours.     diphenhydrAMINE 25 mg capsule  Commonly known as:  BENADRYL  Take 25 mg by mouth 2 (two) times daily as needed for allergies.     ESTRACE VAGINAL 0.1 MG/GM vaginal cream  Generic drug:  estradiol  Place 1 application vaginally 2 (two) times a week.     gabapentin 100 MG capsule  Commonly known as:  NEURONTIN  Take 300 mg by mouth at bedtime.     glycopyrrolate 2 MG tablet  Commonly known as:  ROBINUL  Take 1 mg by mouth 2 (two) times daily.     hydrALAZINE 25 MG tablet  Commonly known as:  APRESOLINE  Take 25 mg by mouth 3 (three) times daily.     losartan-hydrochlorothiazide 100-25 MG tablet  Commonly known as:  HYZAAR  Take 1 tablet by mouth every morning. Reported on 01/31/2016     magnesium hydroxide 400 MG/5ML suspension  Commonly known as:  MILK OF MAGNESIA  Take 45 mLs by mouth daily as needed for mild constipation. Take for 7 days     METANX PO  Take 1 tablet by mouth 2 (two) times daily.     multivitamin with minerals Tabs tablet  Take 1 tablet by mouth daily.     NIFEdipine 10 MG capsule  Commonly known as:  PROCARDIA  Take 10 mg by mouth 3 (three) times daily.     oxyCODONE-acetaminophen 5-325 MG tablet  Commonly known as:  PERCOCET/ROXICET  Take 1-2 tablets by  mouth every 6 (six) hours as needed for moderate pain or severe pain. Take 1 tablet for moderate pain and 2 tablets for severe pain     pantoprazole 40 MG tablet  Commonly known as:  PROTONIX  Take 40 mg by mouth daily.     PARoxetine 40 MG tablet  Commonly known as:  PAXIL  Take 40 mg by mouth daily. Reported on 01/18/2016     polyethylene glycol packet  Commonly known as:  MIRALAX / GLYCOLAX  Take 17 g by mouth daily.     potassium chloride SA 20 MEQ tablet  Commonly known as:  K-DUR,KLOR-CON  Take 40 mEq by mouth daily.     sennosides-docusate sodium 8.6-50 MG tablet  Commonly known as:  SENOKOT-S  Take 2 tablets by mouth at bedtime.     VITAMIN B COMPLEX PO  Take 1 tablet by mouth daily.     vitamin B-12 1000 MCG tablet  Commonly known as:  CYANOCOBALAMIN  Take 1,000 mcg by mouth at bedtime.     vitamin E 400 UNIT capsule  Take 400 Units by mouth daily.        Review of Systems  Constitutional: Negative for fever, chills, activity change, appetite change and fatigue.  HENT: Negative for congestion, rhinorrhea, sinus pressure, sneezing and sore throat.        Low voice at times.   Eyes: Negative.   Respiratory: Negative.   Cardiovascular: Negative.   Gastrointestinal: Positive for abdominal pain. Negative for nausea, vomiting, diarrhea, constipation and abdominal distention.  Endocrine: Negative.   Genitourinary: Negative for dysuria, urgency, frequency and flank pain.  Musculoskeletal: Positive for back pain and gait problem.  Skin: Negative.   Neurological: Negative  for dizziness, seizures, light-headedness and headaches.  Hematological: Does not bruise/bleed easily.  Psychiatric/Behavioral: Negative.     Filed Vitals:   02/24/16 0904  BP: 146/72  Pulse: 81  Temp: 96.9 F (36.1 C)  TempSrc: Oral  Resp: 20  Height: 5\' 5"  (1.651 m)  Weight: 287 lb 6.4 oz (130.364 kg)  SpO2: 97%   Body mass index is 47.83 kg/(m^2). Physical Exam  Constitutional: She  is oriented to person, place, and time. She appears well-nourished. No distress.  Obese   HENT:  Head: Normocephalic.  Mouth/Throat: Oropharynx is clear and moist. No oropharyngeal exudate.  Speaks in low voice at times  Eyes: Conjunctivae and EOM are normal. Pupils are equal, round, and reactive to light. Right eye exhibits no discharge. Left eye exhibits no discharge. No scleral icterus.  Neck: Normal range of motion. No JVD present. No thyromegaly present.  Cardiovascular: Normal rate, regular rhythm, normal heart sounds and intact distal pulses.  Exam reveals no gallop and no friction rub.   No murmur heard. Pulmonary/Chest: Effort normal and breath sounds normal. No respiratory distress. She has no wheezes. She has no rales.  Abdominal: Soft. Bowel sounds are normal. She exhibits no distension. There is no tenderness. There is no rebound and no guarding.  Abdominal brace in place.   Musculoskeletal: She exhibits no edema or tenderness.  Unsteady gait. Uses wheelchair most of the time.  Lymphadenopathy:    She has no cervical adenopathy.  Neurological: She is oriented to person, place, and time.  Skin: Skin is warm and dry. No rash noted. No erythema. No pallor.  Psychiatric: She has a normal mood and affect.    Labs reviewed: Basic Metabolic Panel:  Recent Labs  01/28/16 1736 01/30/16 1652 01/31/16 0456 02/14/16  NA 138 135 140 140  K 3.6 3.1* 3.4* 4.1  CL 103 98* 102  --   CO2 26 28 29   --   GLUCOSE 117* 123* 130*  --   BUN 18 12 12 13   CREATININE 1.09* 0.93 0.87 0.9  CALCIUM 9.2 9.2 8.9  --   MG  --   --  2.0  --    Liver Function Tests:  Recent Labs  01/10/16 01/28/16 1736 01/30/16 1652  AST 30 17 17   ALT 48* 16 13*  ALKPHOS 173* 115 115  BILITOT  --  0.6 0.7  PROT  --  7.4 7.6  ALBUMIN  --  3.6 3.6    Recent Labs  01/30/16 1652  LIPASE 28   No results for input(s): AMMONIA in the last 8760 hours. CBC:  Recent Labs  10/05/15  01/05/16 1334   01/28/16 1736 01/30/16 1652 01/31/16 0456  WBC  --   < > 11.1*  < > 7.3 5.9 5.1  NEUTROABS 43  --  7.0  --  4.7  --   --   HGB 10.1*  < > 10.6*  < > 11.7* 11.2* 11.4*  HCT 32*  < > 33.8*  < > 35.2 34.3* 34.6*  MCV  --   < > 89.2  --  85.4 86.4 86.4  PLT 415*  < > 285  < > 329 312 220  < > = values in this interval not displayed. Cardiac Enzymes:  Recent Labs  01/30/16 1652  TROPONINI 0.03   BNP: Invalid input(s): POCBNP CBG: No results for input(s): GLUCAP in the last 8760 hours.  Procedures and Imaging Studies During Stay: Mr Lumbar Spine W Wo Contrast  02/24/2016  CLINICAL DATA:  Bilateral groin pain. Severe lower extremity weakness. Previous fusion from L1-2 S1 EXAM: MRI LUMBAR SPINE WITHOUT AND WITH CONTRAST TECHNIQUE: Multiplanar and multiecho pulse sequences of the lumbar spine were obtained without and with intravenous contrast. CONTRAST:  81mL MULTIHANCE GADOBENATE DIMEGLUMINE 529 MG/ML IV SOLN COMPARISON:  Radiographs dated 01/30/2016, CT scan dated 01/30/2016 and MRI dated 12/06/2015 FINDINGS: Segmentation:  Normal. Alignment:  Normal. Vertebrae:  No acute abnormality.  Previous fusion from L1-2 S1. Conus medullaris: Extends to the L1 level and appears normal. Paraspinal and other soft tissues: No acute abnormalities. Fatty atrophy of the posterior paraspinal musculature with scarring in the soft tissues from prior surgery. These are stable since the prior study. Disc levels: T11-12 and T12-L1:  Normal. L1-2: Interbody fusion. Interval removal of the right pedicle screw. Small amount of fluid adjacent to the right facet joint which is probably a small seroma. This is not felt to be significant. Scarring around the thecal sac to the expected degree. L2-3:  Normal disc.  Solid posterior fusion. L3-4: Solid interbody and posterior fusion. Slight scarring around the thecal sac with the expected degree of enhancement after contrast administration. No residual or recurrent neural  impingement. L4-5 and L5-S1: Solid anterior and posterior fusion. No appreciable scar tissue around the thecal sac or nerve roots. No neural impingement. No change since the prior study. IMPRESSION: 1. Interval removal of the right pedicle screw at L1. Small seroma in the posterior soft tissues adjacent to the right facet joint at that level which is felt to be postsurgical in origin. 2. Expected degree of scar tissue and enhancement around the thecal sac and nerve roots at L1-2 and L3-4. 3. No visible neural impingement. Electronically Signed   By: Lorriane Shire M.D.   On: 02/24/2016 11:50   Mr Pelvis Wo Contrast  02/08/2016  CLINICAL DATA:  Severe anterior pelvic pain radiating into the right and left groin for about 2 weeks. Patient status post lower lumbar fusion 09/28/2015. EXAM: MRI PELVIS WITHOUT CONTRAST TECHNIQUE: Multiplanar multisequence MR imaging of the pelvis was performed. No intravenous contrast was administered. COMPARISON:  CT abdomen and pelvis 01/30/2016. FINDINGS: Artifact from lower lumbar fusion hardware is noted. The patient has marked degenerative change about the symphysis pubis with bony hypertrophy and subchondral edema about the joint. Degenerative change is also seen about the sacroiliac joints bilaterally and appears somewhat worse on the left where a few tiny subchondral cysts are identified. Bone marrow signal is otherwise unremarkable. No fracture is identified. There is no avascular necrosis of the femoral heads. Imaged pelvic musculature is intact and normal appearance. No evidence of bursitis is seen. No hernia is identified. Imaged intrapelvic contents demonstrate postoperative change of hysterectomy. No lymphadenopathy or fluid collection is identified. IMPRESSION: No acute abnormality. 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 moderate bilateral SI joint degenerative disease appears worse on the left. Status post lower lumbar fusion and hysterectomy.  Electronically Signed   By: Inge Rise M.D.   On: 02/08/2016 13:39   Dg Abd 2 Views  01/30/2016  CLINICAL DATA:  Abdominal pain with nausea and vomiting for several weeks. EXAM: ABDOMEN - 2 VIEW COMPARISON:  None. FINDINGS: No evidence of dilated bowel loops or free air. Residual contrast seen within the urinary tract from recent CT. Lumbar spine fusion hardware again noted. IMPRESSION: Normal bowel gas pattern.  No acute findings. Electronically Signed   By: Earle Gell M.D.   On: 01/30/2016 19:55  Ct Renal Stone Study  01/28/2016  CLINICAL DATA:  70 year old female with bilateral lower abdominal and pelvic for 2 days with dysuria and urgency. Reported fever. Recent lumbar surgery performed on 01/02/2016. EXAM: CT ABDOMEN AND PELVIS WITHOUT CONTRAST TECHNIQUE: Multidetector CT imaging of the abdomen and pelvis was performed following the standard protocol without IV contrast. COMPARISON:  None. FINDINGS: Please note that parenchymal abnormalities may be missed without intravenous contrast. Lower chest:  Cardiomegaly identified without acute abnormality. Hepatobiliary: The liver is unremarkable. The patient is status post cholecystectomy. There is no evidence of biliary dilatation. Pancreas: Unremarkable. Spleen: Unremarkable. Adrenals/Urinary Tract: The kidneys, right adrenal gland and bladder are unremarkable. There is no evidence of hydronephrosis or urinary calculi. A 1.5 cm probable left adrenal adenoma noted. Stomach/Bowel: Unremarkable. There is no evidence of bowel obstruction or definite focal bowel wall thickening. Vascular/Lymphatic: Aortic atherosclerotic calcifications noted without aneurysm. No enlarged lymph nodes identified. Reproductive: Patient is status post hysterectomy. No adnexal masses identified. Other: No free fluid or pneumoperitoneum. Musculoskeletal: Pedicular and posterior rod fixation is identified on the right from L2-L4 and on the left from L1-L4. Intervertebral cages at  L1-2 and L3-4 noted. Anterior plate and screw fixation at L4-5 and L5-S1 noted. Moderate posterior soft tissue density/stranding in the lumbar region identified. IMPRESSION: No evidence of acute abnormality within the abdomen or pelvis. Postoperative changes within the lumbar spine as described. Consider further evaluation if there is strong clinical history for infection. Cardiomegaly and aortic atherosclerosis. Electronically Signed   By: Margarette Canada M.D.   On: 01/28/2016 18:39   Ct Cta Abd/pel W/cm &/or W/o Cm  01/30/2016  CLINICAL DATA:  70 year old female with continued pelvic and lower abdominal pain. Patient was seen in the ER 2 days ago with diagnosis of bladder infection. Clinical concern for aortic dissection. EXAM: CTA ABDOMEN AND PELVIS wITHOUT AND WITH CONTRAST TECHNIQUE: Multidetector CT imaging of the abdomen and pelvis was performed using the standard protocol during bolus administration of intravenous contrast. Multiplanar reconstructed images and MIPs were obtained and reviewed to evaluate the vascular anatomy. CONTRAST:  100 cc Isovue 370 COMPARISON:  CT dated 01/28/2016 FINDINGS: Minimal left lung base atelectatic changes. Focal area of linear scarring noted in the lingula. There is a 7 mm ground-glass nodular density in the right lower lobe (series 4 image 15). Adenocarcinoma cannot be excluded. Follow up by CT is recommended in 12 months, with continued annual surveillance for a minimum of 3 years. No intra-abdominal free air or free fluid. Cholecystectomy. The liver, pancreas, spleen, and the right adrenal gland appear unremarkable. There is a 1.5 cm indeterminate left adrenal nodule, possibly an adenoma. The kidneys, visualized ureters, and urinary bladder appear unremarkable. The hysterectomy. Moderate stool noted throughout the colon. There is no evidence of bowel obstruction or active inflammation. The appendix is not visualized with certainty. No inflammatory changes identified in the  right lower quadrant. There is moderate aortoiliac atherosclerotic disease. There is no evidence of aortic dissection or aneurysm. The origins of the celiac axis, SMA, IMA as well as the origins of the renal arteries appear patent. The IVC and the portal vein appear unremarkable. No portal venous gas identified. There is no adenopathy. Midline vertical anterior pelvic wall incisional scar noted. There is multilevel postsurgical changes of the lumbar spine including intervertebral cage at L1-L2 and L3-L4. L4-L5 new L5-S1 anterior fixation plate and screws caps noted. The left pedicular screw noted at L1. There is laminectomy with bilateral posterior fixation screws at L2  and L3. There is postsurgical changes and inflammation of the paraspinal soft tissue and musculature. There is a 2.1 x 6.9 cm complex appearing fluid collection at the operative bed extending from the posterior elements to the subcutaneous soft tissues of the lower back at the level of L1-L3. This likely represents a postsurgical seroma. An infected fluid or developing abscess is not excluded. Clinical correlation is recommended. No acute fracture identified. Review of the MIP images confirms the above findings. IMPRESSION: No acute intra-abdominal pelvic pathology. Specifically there is no evidence of aortic dissection or aneurysm. A 7 mm ground-glass nodular density at the right lung base. Adenocarcinoma cannot be excluded. Follow up by CT is recommended in 12 months, with continued annual surveillance for a minimum of 3 years.These recommendations are taken from:Recommendations for the Management of Subsolid Pulmonary Nodules Detected at CT: A Statement from the [facility] 2013; 266:1, [phone]. Postsurgical changes of lumbar spine as described with a small fluid collection Set in the surgical bed, likely postoperative seroma. Electronically Signed   By: Anner Crete M.D.   On: 01/30/2016 19:15    Assessment/Plan:   1.  Essential hypertension B/p stable.Continue Clonidine 0.1 mg tablet , Hyzaar 100-25 mg Tablet and Hydralazine 25 mg tablet. BMP in 1-2 weeks with PCP  2. Abdominal pain of unknown etiology Had MRI done 02/24/2016 pending results per Neuro orders.Seen by GI specialist  02/02/2016 Dicyclomine 20 mg tablet ordered. Continue to follow up with GI, Neuro and Ortho specialist.   3. Depression Stable. Continue on Paxil 40 mg tablet. PCP to monitor for mood changes.   4. Neuropathic pain of both legs Continue on Gabapentin. Follow with Neurologist.   5. Gastroesophageal reflux disease without esophagitis Continue on Protonix 40 mg Tablet.   6. Abnormality of gait She has worked with PT/OT now stable for discharge home She will be discharged with Home health PT/OT to continue with ROM, Exercise, Gait stability and muscle strengthening.Fall and safety precautions.   Patient is being discharged with the following home health services:   PT/OT to continue with ROM, Exercise, Gait stability and muscle strengthening.   Patient is being discharged with the following durable medical equipment:   No DME required.    Rx written X 1 month supply.   Patient has been advised to f/u with their PCP in 1-2 weeks to bring them up to date on their rehab stay. Social services at facility was responsible for arranging this appointment. Pt was provided with a 30 day supply of prescriptions for medications and refills must be obtained from their PCP. For controlled substances, a more limited supply may be provided adequate until PCP appointment only.  Future labs/tests needed: CBC, BMP in 1-2 weeks with PCP

## 2016-02-26 NOTE — Progress Notes (Signed)
Patient ID: Amy [name], female   DOB: 07-Mar-1946, 70 y.o.   MRN: [id number]  Location:  [facility] Room Number: 807 Place of Service:  SNF (31)  Provider: Marlowe Sax FNP-C   PCP: Blanchie Serve, MD Patient Care Team: Blanchie Serve, MD as PCP - General (Internal Medicine)  Extended Emergency Contact Information Primary Emergency Contact: [name] Address: [address]          [place], [place] [address] [name] of [place] Phone: [phone] Mobile Phone: [phone] Relation: Spouse Secondary Emergency Contact: [name] Address: [address]          [place], [place] [address] [name] of [place] Phone: [phone] Mobile Phone: [phone] Relation: Daughter  Code Status: DNR Goals of care:  Advanced Directive information Advanced Directives 02/24/2016  Does patient have an advance directive? Yes  Type of Advance Directive Out of facility DNR (pink MOST or yellow form)  Does patient want to make changes to advanced directive? No - Patient declined  Copy of advanced directive(s) in chart? Yes  Would patient like information on creating an advanced directive? -     Allergies  Allergen Reactions  . Latex Other (See Comments)    Sores, blisters  . Penicillins Rash    UNKNOWN but able to take AMPICILLIN     Chief Complaint  Patient presents with  . Discharge Note    Discharged from SNF    HPI:  70 y.o. female seen today at  [facility] for discharge home. She was here for short term rehabilitation post hospital admission from 01/02/16-01/06/16 with pseudoarthrosis with loosening of hardware. She underwent exploration of fusion removal of hardware on the right from L1-L3 removal of right L1 screw, redo posterior lateral fusion L1-L2. She has  A medical history of HTN, Anxiety, Dysphonia, Fibromyalgia, GERD,Neuropathy, Depression, constipation among others. She is seen today  in her room. She states back pain under controlled with current pain regimen. She has worked with PT/OT now stable for discharge home. She will be discharge home with PT/OT to continue with PT/OT for ROM, Exercise, gait stability and muscle strengthening. She does not require any DME states has a walker at home. Home health services will be arraged by Facility Social worker prior to discharge. A one month supply prescription hard script written.      Past Medical History  Diagnosis Date  . Anxiety   . Dysphonia     intermittent "voice changes"  . Chronic back pain     lumbar steroid injection's  . Seasonal allergies   . Fibromyalgia   . Weakness     both hands and feet  . Urinary frequency   . Glaucoma   . History of MRSA infection 2011  . History of DVT (deep vein thrombosis) 1989    LEFT UPPER ARM  . Hypertension   . Short of breath on exertion   . Depression   . GERD (gastroesophageal reflux disease)   . Pelvic pain in female   . Arthritis     ddd- RA  . Irregular heart rate     "years ago"  . Peripheral vascular disease ([place])     "poor circulation"  . Mild obstructive sleep apnea     per study 02-07-2006 - no cpap  . Asthma     "sleeping asthma"  . History of hiatal hernia   . Headache   . Neuropathy ([place])  feet  . Low iron   . Constipation     Past Surgical History  Procedure Laterality Date  . Abdominal hysterectomy  1978  . Nose surgery  2007  . Mass excision Left 02/20/2013    Procedure: EXCISION LEFT BUTTOCK  MASS;  Surgeon: Adin Hector, MD;  Location: [facility];  Service: General;  Laterality: Left;  . Esophagogastroduodenoscopy N/A 07/15/2013    Procedure: ESOPHAGOGASTRODUODENOSCOPY (EGD);  Surgeon: Winfield Cunas., MD;  Location: [facility];  Service: Endoscopy;  Laterality: N/A;  need xray  . Abdominal adhesions removed    . Colonoscopy    . Bunionectomy Left 2008  . Shoulder open rotator cuff repair Left 05/05/2014    Procedure: OPEN  ACROMIONECTOMY AND OPEN REPAIR OF ROTATOR CUFF, TISSUEMEND GRAFT WITH ANCHOR ;  Surgeon: Tobi Bastos, MD;  Location: [facility];  Service: Orthopedics;  Laterality: Left;  . Esophageal manometry N/A 11/22/2014    Procedure: ESOPHAGEAL MANOMETRY (EM);  Surgeon: Winfield Cunas., MD;  Location: [facility];  Service: Endoscopy;  Laterality: N/A;  . Lumbar fusion  2014    L4 -- L5  . Removal hardware l4-l5/  bilateral laminectomy l2 - l5 and fusion  06-12-2011  . Laparoscopic cholecystectomy  01-11-2006  . Cysto with hydrodistension N/A 07/12/2015    Procedure: CYSTOSCOPY/HYDRODISTENSION;  Surgeon: Bjorn Loser, MD;  Location: [facility];  Service: Urology;  Laterality: N/A;  . Eye surgery Bilateral     cataract surgery with lens implants  . Tonsillectomy    . Hardware removal N/A 01/02/2016    Procedure: Exploration of Lumbar Fusion,Removal of hardware Lumbar One-Two ;Redo Posterior Lumbar Fusion Lumbar One-Two;  Surgeon: Kary Kos, MD;  Location: [facility];  Service: Neurosurgery;  Laterality: N/A;      reports that she quit smoking about 46 years ago. She has never used smokeless tobacco. She reports that she drinks alcohol. She reports that she does not use illicit drugs. Social History   Social History  . Marital Status: Married    Spouse Name: N/A  . Number of Children: N/A  . Years of Education: N/A   Occupational History  . Not on file.   Social History Main Topics  . Smoking status: Former Smoker    Quit date: 07/10/1969  . Smokeless tobacco: Never Used  . Alcohol Use: Yes     Comment: rare  . Drug Use: No  . Sexual Activity: Not on file   Other Topics Concern  . Not on file   Social History Narrative   Functional Status Survey:    Allergies  Allergen Reactions  . Latex Other (See Comments)    Sores, blisters  . Penicillins Rash    UNKNOWN but able to take AMPICILLIN     Pertinent  Health Maintenance Due  Topic Date Due  .  COLONOSCOPY  01/23/1996  . DEXA SCAN  01/23/2011  . PNA vac Low Risk Adult (1 of 2 - PCV13) 09/24/2016 (Originally 01/23/2011)  . INFLUENZA VACCINE  04/24/2016  . MAMMOGRAM  11/28/2017    Medications:   Medication List       This list is accurate as of: 01/18/16 11:59 PM.  Always use your most recent med list.               azelaic acid 20 % cream  Commonly known as:  AZELEX  Apply 1 application topically 2 (two) times daily. Reported on 01/31/2016     CALCIUM-VITAMIN  D3 PO  Take 1 tablet by mouth daily.     cloNIDine 0.1 MG tablet  Commonly known as:  CATAPRES  Take 0.1 mg by mouth 2 (two) times daily. For Systolic > [id number]     cyclobenzaprine 10 MG tablet  Commonly known as:  FLEXERIL  Take 1 tablet (10 mg total) by mouth 3 (three) times daily as needed for muscle spasms.     diphenhydrAMINE 25 mg capsule  Commonly known as:  BENADRYL  Take 25 mg by mouth 2 (two) times daily as needed for allergies.     ESTRACE VAGINAL 0.1 MG/GM vaginal cream  Generic drug:  estradiol  Place 1 application vaginally 2 (two) times a week.     gabapentin 300 MG capsule  Commonly known as:  NEURONTIN  Take 300 mg by mouth at bedtime. Reported on 01/31/2016     glycopyrrolate 2 MG tablet  Commonly known as:  ROBINUL  Take 1 mg by mouth 2 (two) times daily.     hydrALAZINE 25 MG tablet  Commonly known as:  APRESOLINE  Take 25 mg by mouth 3 (three) times daily.     losartan-hydrochlorothiazide 100-25 MG tablet  Commonly known as:  HYZAAR  Take 1 tablet by mouth every morning. Reported on 01/31/2016     METANX PO  Take 1 tablet by mouth 2 (two) times daily.     multivitamin with minerals Tabs tablet  Take 1 tablet by mouth daily.     oxyCODONE-acetaminophen 5-325 MG tablet  Commonly known as:  PERCOCET/ROXICET  Take 1-2 tablets by mouth every 4 (four) hours as needed for moderate pain.     pantoprazole 40 MG tablet  Commonly known as:  PROTONIX  Take 40 mg by mouth daily.      PARoxetine 40 MG tablet  Commonly known as:  PAXIL  Take 40 mg by mouth daily. Reported on 01/18/2016     polyethylene glycol packet  Commonly known as:  MIRALAX / GLYCOLAX  Take 17 g by mouth daily.     potassium chloride SA 20 MEQ tablet  Commonly known as:  K-DUR,KLOR-CON  Take 40 mEq by mouth daily.     sennosides-docusate sodium 8.6-50 MG tablet  Commonly known as:  SENOKOT-S  Take 2 tablets by mouth at bedtime.     silodosin 8 MG Caps capsule  Commonly known as:  RAPAFLO  Take 8 mg by mouth daily as needed (urinary retention). Reported on 01/31/2016     VITAMIN B COMPLEX PO  Take 1 tablet by mouth daily.     vitamin B-12 1000 MCG tablet  Commonly known as:  CYANOCOBALAMIN  Take 1,000 mcg by mouth at bedtime.     vitamin E 400 UNIT capsule  Take 400 Units by mouth daily.        Review of Systems  Constitutional: Negative for fever, chills, activity change, appetite change and fatigue.  HENT: Negative for congestion, rhinorrhea, sinus pressure, sneezing and sore throat.   Eyes: Negative.   Respiratory: Negative for apnea, cough, chest tightness, shortness of breath and wheezing.   Cardiovascular: Negative for chest pain, palpitations and leg swelling.  Gastrointestinal: Negative for nausea, vomiting, abdominal pain, diarrhea, constipation and abdominal distention.  Endocrine: Negative.   Genitourinary: Negative for dysuria, urgency, frequency and flank pain.  Musculoskeletal: Positive for gait problem.  Skin: Negative for color change, pallor and rash.       Lower back surgical incision  Neurological: Negative for dizziness, light-headedness and  headaches.  Psychiatric/Behavioral: Negative for hallucinations, confusion, sleep disturbance and agitation. The patient is not nervous/anxious.     Filed Vitals:   01/18/16 0825  BP: 122/76  Pulse: 82  Temp: 98.2 F (36.8 C)  Resp: 18  Height: 5\' 5"  (1.651 m)  Weight: 287 lb 6.4 oz (130.364 kg)  SpO2: 97%   Body  mass index is 47.83 kg/(m^2). Physical Exam  Labs reviewed: Basic Metabolic Panel:  Recent Labs  01/28/16 1736 01/30/16 1652 01/31/16 0456 02/14/16  NA 138 135 140 140  K 3.6 3.1* 3.4* 4.1  CL 103 98* 102  --   CO2 26 28 29   --   GLUCOSE 117* 123* 130*  --   BUN 18 12 12 13   CREATININE 1.09* 0.93 0.87 0.9  CALCIUM 9.2 9.2 8.9  --   MG  --   --  2.0  --    Liver Function Tests:  Recent Labs  01/10/16 01/28/16 1736 01/30/16 1652  AST 30 17 17   ALT 48* 16 13*  ALKPHOS 173* 115 115  BILITOT  --  0.6 0.7  PROT  --  7.4 7.6  ALBUMIN  --  3.6 3.6    Recent Labs  01/30/16 1652  LIPASE 28   No results for input(s): AMMONIA in the last 8760 hours. CBC:  Recent Labs  10/05/15  01/05/16 1334  01/28/16 1736 01/30/16 1652 01/31/16 0456  WBC  --   < > 11.1*  < > 7.3 5.9 5.1  NEUTROABS 43  --  7.0  --  4.7  --   --   HGB 10.1*  < > 10.6*  < > 11.7* 11.2* 11.4*  HCT 32*  < > 33.8*  < > 35.2 34.3* 34.6*  MCV  --   < > 89.2  --  85.4 86.4 86.4  PLT 415*  < > 285  < > 329 312 220  < > = values in this interval not displayed. Cardiac Enzymes:  Recent Labs  01/30/16 1652  TROPONINI 0.03   BNP: Invalid input(s): POCBNP CBG: No results for input(s): GLUCAP in the last 8760 hours.  Procedures and Imaging Studies During Stay: Mr Lumbar Spine W Wo Contrast  02/24/2016  CLINICAL DATA:  Bilateral groin pain. Severe lower extremity weakness. Previous fusion from L1-2 S1 EXAM: MRI LUMBAR SPINE WITHOUT AND WITH CONTRAST TECHNIQUE: Multiplanar and multiecho pulse sequences of the lumbar spine were obtained without and with intravenous contrast. CONTRAST:  77mL MULTIHANCE GADOBENATE DIMEGLUMINE 529 MG/ML IV SOLN COMPARISON:  Radiographs dated 01/30/2016, CT scan dated 01/30/2016 and MRI dated 12/06/2015 FINDINGS: Segmentation:  Normal. Alignment:  Normal. Vertebrae:  No acute abnormality.  Previous fusion from L1-2 S1. Conus medullaris: Extends to the L1 level and appears normal.  Paraspinal and other soft tissues: No acute abnormalities. Fatty atrophy of the posterior paraspinal musculature with scarring in the soft tissues from prior surgery. These are stable since the prior study. Disc levels: T11-12 and T12-L1:  Normal. L1-2: Interbody fusion. Interval removal of the right pedicle screw. Small amount of fluid adjacent to the right facet joint which is probably a small seroma. This is not felt to be significant. Scarring around the thecal sac to the expected degree. L2-3:  Normal disc.  Solid posterior fusion. L3-4: Solid interbody and posterior fusion. Slight scarring around the thecal sac with the expected degree of enhancement after contrast administration. No residual or recurrent neural impingement. L4-5 and L5-S1: Solid anterior and posterior fusion. No appreciable  scar tissue around the thecal sac or nerve roots. No neural impingement. No change since the prior study. IMPRESSION: 1. Interval removal of the right pedicle screw at L1. Small seroma in the posterior soft tissues adjacent to the right facet joint at that level which is felt to be postsurgical in origin. 2. Expected degree of scar tissue and enhancement around the thecal sac and nerve roots at L1-2 and L3-4. 3. No visible neural impingement. Electronically Signed   By: Lorriane Shire M.D.   On: 02/24/2016 11:50   Mr Pelvis Wo Contrast  02/08/2016  CLINICAL DATA:  Severe anterior pelvic pain radiating into the right and left groin for about 2 weeks. Patient status post lower lumbar fusion 09/28/2015. EXAM: MRI PELVIS WITHOUT CONTRAST TECHNIQUE: Multiplanar multisequence MR imaging of the pelvis was performed. No intravenous contrast was administered. COMPARISON:  CT abdomen and pelvis 01/30/2016. FINDINGS: Artifact from lower lumbar fusion hardware is noted. The patient has marked degenerative change about the symphysis pubis with bony hypertrophy and subchondral edema about the joint. Degenerative change is also seen  about the sacroiliac joints bilaterally and appears somewhat worse on the left where a few tiny subchondral cysts are identified. Bone marrow signal is otherwise unremarkable. No fracture is identified. There is no avascular necrosis of the femoral heads. Imaged pelvic musculature is intact and normal appearance. No evidence of bursitis is seen. No hernia is identified. Imaged intrapelvic contents demonstrate postoperative change of hysterectomy. No lymphadenopathy or fluid collection is identified. IMPRESSION: No acute abnormality. 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 moderate bilateral SI joint degenerative disease appears worse on the left. Status post lower lumbar fusion and hysterectomy. Electronically Signed   By: Inge Rise M.D.   On: 02/08/2016 13:39   Dg Abd 2 Views  01/30/2016  CLINICAL DATA:  Abdominal pain with nausea and vomiting for several weeks. EXAM: ABDOMEN - 2 VIEW COMPARISON:  None. FINDINGS: No evidence of dilated bowel loops or free air. Residual contrast seen within the urinary tract from recent CT. Lumbar spine fusion hardware again noted. IMPRESSION: Normal bowel gas pattern.  No acute findings. Electronically Signed   By: Earle Gell M.D.   On: 01/30/2016 19:55   Ct Renal Stone Study  01/28/2016  CLINICAL DATA:  70 year old female with bilateral lower abdominal and pelvic for 2 days with dysuria and urgency. Reported fever. Recent lumbar surgery performed on 01/02/2016. EXAM: CT ABDOMEN AND PELVIS WITHOUT CONTRAST TECHNIQUE: Multidetector CT imaging of the abdomen and pelvis was performed following the standard protocol without IV contrast. COMPARISON:  None. FINDINGS: Please note that parenchymal abnormalities may be missed without intravenous contrast. Lower chest:  Cardiomegaly identified without acute abnormality. Hepatobiliary: The liver is unremarkable. The patient is status post cholecystectomy. There is no evidence of biliary dilatation.  Pancreas: Unremarkable. Spleen: Unremarkable. Adrenals/Urinary Tract: The kidneys, right adrenal gland and bladder are unremarkable. There is no evidence of hydronephrosis or urinary calculi. A 1.5 cm probable left adrenal adenoma noted. Stomach/Bowel: Unremarkable. There is no evidence of bowel obstruction or definite focal bowel wall thickening. Vascular/Lymphatic: Aortic atherosclerotic calcifications noted without aneurysm. No enlarged lymph nodes identified. Reproductive: Patient is status post hysterectomy. No adnexal masses identified. Other: No free fluid or pneumoperitoneum. Musculoskeletal: Pedicular and posterior rod fixation is identified on the right from L2-L4 and on the left from L1-L4. Intervertebral cages at L1-2 and L3-4 noted. Anterior plate and screw fixation at L4-5 and L5-S1 noted. Moderate posterior soft tissue density/stranding in  the lumbar region identified. IMPRESSION: No evidence of acute abnormality within the abdomen or pelvis. Postoperative changes within the lumbar spine as described. Consider further evaluation if there is strong clinical history for infection. Cardiomegaly and aortic atherosclerosis. Electronically Signed   By: Margarette Canada M.D.   On: 01/28/2016 18:39   Ct Cta Abd/pel W/cm &/or W/o Cm  01/30/2016  CLINICAL DATA:  70 year old female with continued pelvic and lower abdominal pain. Patient was seen in the ER 2 days ago with diagnosis of bladder infection. Clinical concern for aortic dissection. EXAM: CTA ABDOMEN AND PELVIS wITHOUT AND WITH CONTRAST TECHNIQUE: Multidetector CT imaging of the abdomen and pelvis was performed using the standard protocol during bolus administration of intravenous contrast. Multiplanar reconstructed images and MIPs were obtained and reviewed to evaluate the vascular anatomy. CONTRAST:  100 cc Isovue 370 COMPARISON:  CT dated 01/28/2016 FINDINGS: Minimal left lung base atelectatic changes. Focal area of linear scarring noted in the lingula.  There is a 7 mm ground-glass nodular density in the right lower lobe (series 4 image 15). Adenocarcinoma cannot be excluded. Follow up by CT is recommended in 12 months, with continued annual surveillance for a minimum of 3 years. No intra-abdominal free air or free fluid. Cholecystectomy. The liver, pancreas, spleen, and the right adrenal gland appear unremarkable. There is a 1.5 cm indeterminate left adrenal nodule, possibly an adenoma. The kidneys, visualized ureters, and urinary bladder appear unremarkable. The hysterectomy. Moderate stool noted throughout the colon. There is no evidence of bowel obstruction or active inflammation. The appendix is not visualized with certainty. No inflammatory changes identified in the right lower quadrant. There is moderate aortoiliac atherosclerotic disease. There is no evidence of aortic dissection or aneurysm. The origins of the celiac axis, SMA, IMA as well as the origins of the renal arteries appear patent. The IVC and the portal vein appear unremarkable. No portal venous gas identified. There is no adenopathy. Midline vertical anterior pelvic wall incisional scar noted. There is multilevel postsurgical changes of the lumbar spine including intervertebral cage at L1-L2 and L3-L4. L4-L5 new L5-S1 anterior fixation plate and screws caps noted. The left pedicular screw noted at L1. There is laminectomy with bilateral posterior fixation screws at L2 and L3. There is postsurgical changes and inflammation of the paraspinal soft tissue and musculature. There is a 2.1 x 6.9 cm complex appearing fluid collection at the operative bed extending from the posterior elements to the subcutaneous soft tissues of the lower back at the level of L1-L3. This likely represents a postsurgical seroma. An infected fluid or developing abscess is not excluded. Clinical correlation is recommended. No acute fracture identified. Review of the MIP images confirms the above findings. IMPRESSION: No  acute intra-abdominal pelvic pathology. Specifically there is no evidence of aortic dissection or aneurysm. A 7 mm ground-glass nodular density at the right lung base. Adenocarcinoma cannot be excluded. Follow up by CT is recommended in 12 months, with continued annual surveillance for a minimum of 3 years.These recommendations are taken from:Recommendations for the Management of Subsolid Pulmonary Nodules Detected at CT: A Statement from the [facility] 2013; 266:1, [phone]. Postsurgical changes of lumbar spine as described with a small fluid collection Set in the surgical bed, likely postoperative seroma. Electronically Signed   By: Anner Crete M.D.   On: 01/30/2016 19:15    Assessment/Plan:   1. Pseudoarthrosis of lumbar spine Status post short term rehabilitation post hospital admission from 01/02/16-01/06/16 with pseudoarthrosis with loosening of hardware.  She underwent exploration of fusion removal of hardware on the right from L1-L3 removal of right L1 screw, redo posterior lateral fusion L1-L2. She has worked with PT/OT now stable for discharge home. She will be discharge home with PT/OT to continue with PT/OT for ROM, Exercise, gait stability and muscle strengthening.Pain under controlled with current regimen.Continue to follow up with Ortho. CBC in 1-2 weeks with PCP  2. Essential hypertension B/p stable. Continue Hydralazine 25 mg Tablet, Hyzaar 100-25 mg Tablet. BMP in 1-2 weeks with PCP    3. Gastroesophageal reflux disease without esophagitis Asymptomatic.Continue Protonix 40 mg Tablet   4. Neuropathic pain of both legs Continue on gabapentin 100 mg Capsule.   5. Depression Stable. No mood changes. Continue on Paxil 40 mg tablet.   6. Slow transit constipation Miralax,Sennosides-Doc effective.     Patient is being discharged with the following home health services:   PT/OT for ROM, Exercise, gait stability and muscle strengthening.   Patient is being  discharged with the following durable medical equipment:    She does not require any DME states has a walker at home.  Hard script written x 1 month supply.    Patient has been advised to f/u with their PCP in 1-2 weeks to bring them up to date on their rehab stay.  Social services at facility was responsible for arranging this appointment.  Pt was provided with a 30 day supply of prescriptions for medications and refills must be obtained from their PCP.  For controlled substances, a more limited supply may be provided adequate until PCP appointment only.  Future labs/tests needed:  CBC, BMP in 1-2 weeks with PCP

## 2016-02-28 ENCOUNTER — Other Ambulatory Visit: Payer: Self-pay

## 2016-02-28 MED ORDER — OXYCODONE-ACETAMINOPHEN 5-325 MG PO TABS: 1.0000 | ORAL_TABLET | Freq: Four times a day (QID) | ORAL | Status: DC | PRN

## 2016-02-28 NOTE — Telephone Encounter (Signed)
Rx faxed to [facility] @ [phone], phone number [phone]  

## 2016-02-29 IMAGING — RF DG C-ARM 61-120 MIN
1 series · 2 of 2 positions shown · non-contrast
Comparison: 09/04/2013

CLINICAL DATA: L1-2 PLIF

EXAM:
DG C-ARM 61-120 MIN; LUMBAR SPINE - 2-3 VIEW

[Series 1: run · 2 of 2 slices shown]
[im 1/2]
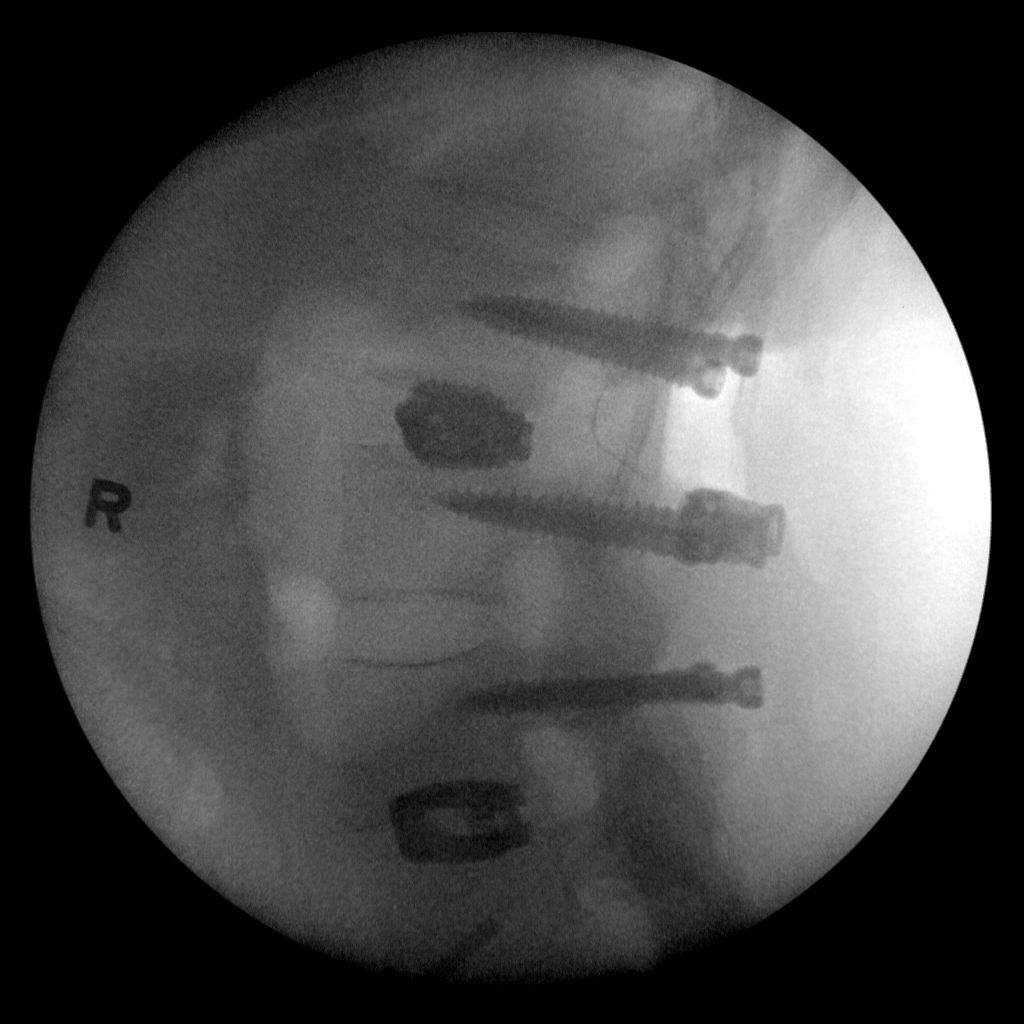
[im 2/2]
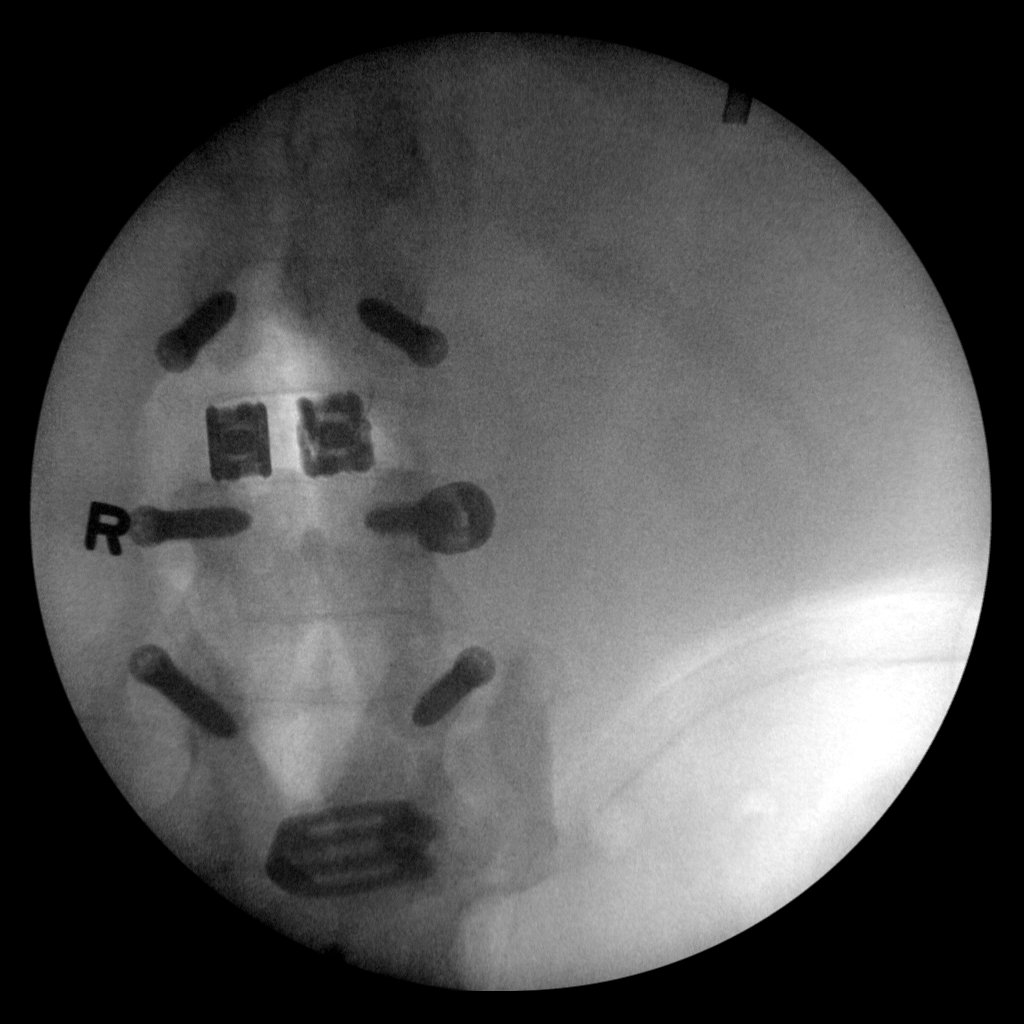

[2 of 2 positions shown; findings below may reference images not displayed]

FLUOROSCOPY TIME:  Radiation Exposure Index (as provided by the
fluoroscopic device): Not available

If the device does not provide the exposure index:

Fluoroscopy Time:  28 seconds

Number of Acquired Images:  2
FINDINGS: Prior fusion is again noted at L3-4. Pedicle screws have been
removed at L4 an new pedicle screws placed at L1 with interbody
fusion at L1-2.
IMPRESSION: L1-2 PLIF

## 2016-03-01 DIAGNOSIS — M545 Low back pain: Secondary | ICD-10-CM | POA: Diagnosis not present

## 2016-03-01 DIAGNOSIS — Z4789 Encounter for other orthopedic aftercare: Secondary | ICD-10-CM | POA: Diagnosis not present

## 2016-03-01 DIAGNOSIS — M455 Ankylosing spondylitis of thoracolumbar region: Secondary | ICD-10-CM | POA: Diagnosis not present

## 2016-03-01 DIAGNOSIS — I10 Essential (primary) hypertension: Secondary | ICD-10-CM | POA: Diagnosis not present

## 2016-03-01 DIAGNOSIS — M792 Neuralgia and neuritis, unspecified: Secondary | ICD-10-CM | POA: Diagnosis not present

## 2016-03-01 DIAGNOSIS — F329 Major depressive disorder, single episode, unspecified: Secondary | ICD-10-CM | POA: Diagnosis not present

## 2016-03-01 DIAGNOSIS — M6281 Muscle weakness (generalized): Secondary | ICD-10-CM | POA: Diagnosis not present

## 2016-03-05 DIAGNOSIS — M6281 Muscle weakness (generalized): Secondary | ICD-10-CM | POA: Diagnosis not present

## 2016-03-05 DIAGNOSIS — M455 Ankylosing spondylitis of thoracolumbar region: Secondary | ICD-10-CM | POA: Diagnosis not present

## 2016-03-05 DIAGNOSIS — M792 Neuralgia and neuritis, unspecified: Secondary | ICD-10-CM | POA: Diagnosis not present

## 2016-03-05 DIAGNOSIS — Z4789 Encounter for other orthopedic aftercare: Secondary | ICD-10-CM | POA: Diagnosis not present

## 2016-03-05 DIAGNOSIS — F329 Major depressive disorder, single episode, unspecified: Secondary | ICD-10-CM | POA: Diagnosis not present

## 2016-03-05 DIAGNOSIS — I10 Essential (primary) hypertension: Secondary | ICD-10-CM | POA: Diagnosis not present

## 2016-03-06 DIAGNOSIS — I10 Essential (primary) hypertension: Secondary | ICD-10-CM | POA: Diagnosis not present

## 2016-03-06 DIAGNOSIS — M6281 Muscle weakness (generalized): Secondary | ICD-10-CM | POA: Diagnosis not present

## 2016-03-06 DIAGNOSIS — M455 Ankylosing spondylitis of thoracolumbar region: Secondary | ICD-10-CM | POA: Diagnosis not present

## 2016-03-06 DIAGNOSIS — M792 Neuralgia and neuritis, unspecified: Secondary | ICD-10-CM | POA: Diagnosis not present

## 2016-03-06 DIAGNOSIS — F329 Major depressive disorder, single episode, unspecified: Secondary | ICD-10-CM | POA: Diagnosis not present

## 2016-03-06 DIAGNOSIS — Z4789 Encounter for other orthopedic aftercare: Secondary | ICD-10-CM | POA: Diagnosis not present

## 2016-03-08 DIAGNOSIS — M792 Neuralgia and neuritis, unspecified: Secondary | ICD-10-CM | POA: Diagnosis not present

## 2016-03-08 DIAGNOSIS — Z4789 Encounter for other orthopedic aftercare: Secondary | ICD-10-CM | POA: Diagnosis not present

## 2016-03-08 DIAGNOSIS — M6281 Muscle weakness (generalized): Secondary | ICD-10-CM | POA: Diagnosis not present

## 2016-03-08 DIAGNOSIS — M455 Ankylosing spondylitis of thoracolumbar region: Secondary | ICD-10-CM | POA: Diagnosis not present

## 2016-03-08 DIAGNOSIS — F329 Major depressive disorder, single episode, unspecified: Secondary | ICD-10-CM | POA: Diagnosis not present

## 2016-03-08 DIAGNOSIS — I10 Essential (primary) hypertension: Secondary | ICD-10-CM | POA: Diagnosis not present

## 2016-03-09 DIAGNOSIS — R102 Pelvic and perineal pain: Secondary | ICD-10-CM | POA: Diagnosis not present

## 2016-03-09 DIAGNOSIS — G8929 Other chronic pain: Secondary | ICD-10-CM | POA: Diagnosis not present

## 2016-03-13 DIAGNOSIS — M455 Ankylosing spondylitis of thoracolumbar region: Secondary | ICD-10-CM | POA: Diagnosis not present

## 2016-03-13 DIAGNOSIS — I10 Essential (primary) hypertension: Secondary | ICD-10-CM | POA: Diagnosis not present

## 2016-03-13 DIAGNOSIS — M792 Neuralgia and neuritis, unspecified: Secondary | ICD-10-CM | POA: Diagnosis not present

## 2016-03-13 DIAGNOSIS — Z4789 Encounter for other orthopedic aftercare: Secondary | ICD-10-CM | POA: Diagnosis not present

## 2016-03-13 DIAGNOSIS — M6281 Muscle weakness (generalized): Secondary | ICD-10-CM | POA: Diagnosis not present

## 2016-03-13 DIAGNOSIS — F329 Major depressive disorder, single episode, unspecified: Secondary | ICD-10-CM | POA: Diagnosis not present

## 2016-03-14 DIAGNOSIS — M797 Fibromyalgia: Secondary | ICD-10-CM | POA: Diagnosis not present

## 2016-03-14 DIAGNOSIS — M869 Osteomyelitis, unspecified: Secondary | ICD-10-CM | POA: Diagnosis not present

## 2016-03-14 DIAGNOSIS — R911 Solitary pulmonary nodule: Secondary | ICD-10-CM | POA: Diagnosis not present

## 2016-03-14 DIAGNOSIS — H6120 Impacted cerumen, unspecified ear: Secondary | ICD-10-CM | POA: Diagnosis not present

## 2016-03-14 DIAGNOSIS — G609 Hereditary and idiopathic neuropathy, unspecified: Secondary | ICD-10-CM | POA: Diagnosis not present

## 2016-03-14 DIAGNOSIS — M5126 Other intervertebral disc displacement, lumbar region: Secondary | ICD-10-CM | POA: Diagnosis not present

## 2016-03-14 DIAGNOSIS — K224 Dyskinesia of esophagus: Secondary | ICD-10-CM | POA: Diagnosis not present

## 2016-03-14 DIAGNOSIS — R490 Dysphonia: Secondary | ICD-10-CM | POA: Diagnosis not present

## 2016-03-14 DIAGNOSIS — K219 Gastro-esophageal reflux disease without esophagitis: Secondary | ICD-10-CM | POA: Diagnosis not present

## 2016-03-15 DIAGNOSIS — F329 Major depressive disorder, single episode, unspecified: Secondary | ICD-10-CM | POA: Diagnosis not present

## 2016-03-15 DIAGNOSIS — I10 Essential (primary) hypertension: Secondary | ICD-10-CM | POA: Diagnosis not present

## 2016-03-15 DIAGNOSIS — M455 Ankylosing spondylitis of thoracolumbar region: Secondary | ICD-10-CM | POA: Diagnosis not present

## 2016-03-15 DIAGNOSIS — Z4789 Encounter for other orthopedic aftercare: Secondary | ICD-10-CM | POA: Diagnosis not present

## 2016-03-15 DIAGNOSIS — M6281 Muscle weakness (generalized): Secondary | ICD-10-CM | POA: Diagnosis not present

## 2016-03-15 DIAGNOSIS — M792 Neuralgia and neuritis, unspecified: Secondary | ICD-10-CM | POA: Diagnosis not present

## 2016-03-20 DIAGNOSIS — M6281 Muscle weakness (generalized): Secondary | ICD-10-CM | POA: Diagnosis not present

## 2016-03-20 DIAGNOSIS — G629 Polyneuropathy, unspecified: Secondary | ICD-10-CM | POA: Diagnosis not present

## 2016-03-20 DIAGNOSIS — F329 Major depressive disorder, single episode, unspecified: Secondary | ICD-10-CM | POA: Diagnosis not present

## 2016-03-20 DIAGNOSIS — I10 Essential (primary) hypertension: Secondary | ICD-10-CM | POA: Diagnosis not present

## 2016-03-20 DIAGNOSIS — M150 Primary generalized (osteo)arthritis: Secondary | ICD-10-CM | POA: Diagnosis not present

## 2016-03-20 DIAGNOSIS — M455 Ankylosing spondylitis of thoracolumbar region: Secondary | ICD-10-CM | POA: Diagnosis not present

## 2016-03-20 DIAGNOSIS — M792 Neuralgia and neuritis, unspecified: Secondary | ICD-10-CM | POA: Diagnosis not present

## 2016-03-22 DIAGNOSIS — M455 Ankylosing spondylitis of thoracolumbar region: Secondary | ICD-10-CM | POA: Diagnosis not present

## 2016-03-22 DIAGNOSIS — G629 Polyneuropathy, unspecified: Secondary | ICD-10-CM | POA: Diagnosis not present

## 2016-03-22 DIAGNOSIS — M792 Neuralgia and neuritis, unspecified: Secondary | ICD-10-CM | POA: Diagnosis not present

## 2016-03-22 DIAGNOSIS — M150 Primary generalized (osteo)arthritis: Secondary | ICD-10-CM | POA: Diagnosis not present

## 2016-03-22 DIAGNOSIS — I10 Essential (primary) hypertension: Secondary | ICD-10-CM | POA: Diagnosis not present

## 2016-03-22 DIAGNOSIS — M6281 Muscle weakness (generalized): Secondary | ICD-10-CM | POA: Diagnosis not present

## 2016-03-23 ENCOUNTER — Other Ambulatory Visit: Payer: Self-pay | Admitting: Neurosurgery

## 2016-03-23 DIAGNOSIS — G629 Polyneuropathy, unspecified: Secondary | ICD-10-CM | POA: Diagnosis not present

## 2016-03-23 DIAGNOSIS — M5137 Other intervertebral disc degeneration, lumbosacral region: Secondary | ICD-10-CM

## 2016-03-23 DIAGNOSIS — M150 Primary generalized (osteo)arthritis: Secondary | ICD-10-CM | POA: Diagnosis not present

## 2016-03-23 DIAGNOSIS — M455 Ankylosing spondylitis of thoracolumbar region: Secondary | ICD-10-CM | POA: Diagnosis not present

## 2016-03-23 DIAGNOSIS — M6281 Muscle weakness (generalized): Secondary | ICD-10-CM | POA: Diagnosis not present

## 2016-03-23 DIAGNOSIS — I10 Essential (primary) hypertension: Secondary | ICD-10-CM | POA: Diagnosis not present

## 2016-03-23 DIAGNOSIS — M792 Neuralgia and neuritis, unspecified: Secondary | ICD-10-CM | POA: Diagnosis not present

## 2016-03-26 ENCOUNTER — Ambulatory Visit
Admission: RE | Admit: 2016-03-26 | Discharge: 2016-03-26 | Disposition: A | Discharge status: Home / Self Care | Payer: Medicare Other | Source: Ambulatory Visit | Attending: Neurosurgery | Admitting: Neurosurgery

## 2016-03-26 DIAGNOSIS — M5137 Other intervertebral disc degeneration, lumbosacral region: Secondary | ICD-10-CM

## 2016-03-26 DIAGNOSIS — M4322 Fusion of spine, cervical region: Secondary | ICD-10-CM | POA: Diagnosis not present

## 2016-03-28 DIAGNOSIS — M6281 Muscle weakness (generalized): Secondary | ICD-10-CM | POA: Diagnosis not present

## 2016-03-28 DIAGNOSIS — M792 Neuralgia and neuritis, unspecified: Secondary | ICD-10-CM | POA: Diagnosis not present

## 2016-03-28 DIAGNOSIS — G629 Polyneuropathy, unspecified: Secondary | ICD-10-CM | POA: Diagnosis not present

## 2016-03-28 DIAGNOSIS — M455 Ankylosing spondylitis of thoracolumbar region: Secondary | ICD-10-CM | POA: Diagnosis not present

## 2016-03-28 DIAGNOSIS — I10 Essential (primary) hypertension: Secondary | ICD-10-CM | POA: Diagnosis not present

## 2016-03-28 DIAGNOSIS — M150 Primary generalized (osteo)arthritis: Secondary | ICD-10-CM | POA: Diagnosis not present

## 2016-03-30 DIAGNOSIS — M6281 Muscle weakness (generalized): Secondary | ICD-10-CM | POA: Diagnosis not present

## 2016-03-30 DIAGNOSIS — M150 Primary generalized (osteo)arthritis: Secondary | ICD-10-CM | POA: Diagnosis not present

## 2016-03-30 DIAGNOSIS — I10 Essential (primary) hypertension: Secondary | ICD-10-CM | POA: Diagnosis not present

## 2016-03-30 DIAGNOSIS — M455 Ankylosing spondylitis of thoracolumbar region: Secondary | ICD-10-CM | POA: Diagnosis not present

## 2016-03-30 DIAGNOSIS — G629 Polyneuropathy, unspecified: Secondary | ICD-10-CM | POA: Diagnosis not present

## 2016-03-30 DIAGNOSIS — M792 Neuralgia and neuritis, unspecified: Secondary | ICD-10-CM | POA: Diagnosis not present

## 2016-04-03 DIAGNOSIS — M6281 Muscle weakness (generalized): Secondary | ICD-10-CM | POA: Diagnosis not present

## 2016-04-03 DIAGNOSIS — M455 Ankylosing spondylitis of thoracolumbar region: Secondary | ICD-10-CM | POA: Diagnosis not present

## 2016-04-03 DIAGNOSIS — I10 Essential (primary) hypertension: Secondary | ICD-10-CM | POA: Diagnosis not present

## 2016-04-03 DIAGNOSIS — M792 Neuralgia and neuritis, unspecified: Secondary | ICD-10-CM | POA: Diagnosis not present

## 2016-04-03 DIAGNOSIS — G629 Polyneuropathy, unspecified: Secondary | ICD-10-CM | POA: Diagnosis not present

## 2016-04-03 DIAGNOSIS — M150 Primary generalized (osteo)arthritis: Secondary | ICD-10-CM | POA: Diagnosis not present

## 2016-04-05 DIAGNOSIS — M792 Neuralgia and neuritis, unspecified: Secondary | ICD-10-CM | POA: Diagnosis not present

## 2016-04-05 DIAGNOSIS — M150 Primary generalized (osteo)arthritis: Secondary | ICD-10-CM | POA: Diagnosis not present

## 2016-04-05 DIAGNOSIS — I10 Essential (primary) hypertension: Secondary | ICD-10-CM | POA: Diagnosis not present

## 2016-04-05 DIAGNOSIS — M455 Ankylosing spondylitis of thoracolumbar region: Secondary | ICD-10-CM | POA: Diagnosis not present

## 2016-04-05 DIAGNOSIS — G629 Polyneuropathy, unspecified: Secondary | ICD-10-CM | POA: Diagnosis not present

## 2016-04-05 DIAGNOSIS — M6281 Muscle weakness (generalized): Secondary | ICD-10-CM | POA: Diagnosis not present

## 2016-04-06 DIAGNOSIS — R109 Unspecified abdominal pain: Secondary | ICD-10-CM | POA: Diagnosis not present

## 2016-04-06 DIAGNOSIS — N3090 Cystitis, unspecified without hematuria: Secondary | ICD-10-CM | POA: Diagnosis not present

## 2016-04-10 DIAGNOSIS — I10 Essential (primary) hypertension: Secondary | ICD-10-CM | POA: Diagnosis not present

## 2016-04-10 DIAGNOSIS — M6281 Muscle weakness (generalized): Secondary | ICD-10-CM | POA: Diagnosis not present

## 2016-04-10 DIAGNOSIS — M455 Ankylosing spondylitis of thoracolumbar region: Secondary | ICD-10-CM | POA: Diagnosis not present

## 2016-04-10 DIAGNOSIS — G629 Polyneuropathy, unspecified: Secondary | ICD-10-CM | POA: Diagnosis not present

## 2016-04-10 DIAGNOSIS — M792 Neuralgia and neuritis, unspecified: Secondary | ICD-10-CM | POA: Diagnosis not present

## 2016-04-10 DIAGNOSIS — M150 Primary generalized (osteo)arthritis: Secondary | ICD-10-CM | POA: Diagnosis not present

## 2016-04-12 DIAGNOSIS — M792 Neuralgia and neuritis, unspecified: Secondary | ICD-10-CM | POA: Diagnosis not present

## 2016-04-12 DIAGNOSIS — G629 Polyneuropathy, unspecified: Secondary | ICD-10-CM | POA: Diagnosis not present

## 2016-04-12 DIAGNOSIS — I10 Essential (primary) hypertension: Secondary | ICD-10-CM | POA: Diagnosis not present

## 2016-04-12 DIAGNOSIS — M455 Ankylosing spondylitis of thoracolumbar region: Secondary | ICD-10-CM | POA: Diagnosis not present

## 2016-04-12 DIAGNOSIS — M6281 Muscle weakness (generalized): Secondary | ICD-10-CM | POA: Diagnosis not present

## 2016-04-12 DIAGNOSIS — M150 Primary generalized (osteo)arthritis: Secondary | ICD-10-CM | POA: Diagnosis not present

## 2016-04-19 DIAGNOSIS — M150 Primary generalized (osteo)arthritis: Secondary | ICD-10-CM | POA: Diagnosis not present

## 2016-04-19 DIAGNOSIS — M455 Ankylosing spondylitis of thoracolumbar region: Secondary | ICD-10-CM | POA: Diagnosis not present

## 2016-04-19 DIAGNOSIS — M792 Neuralgia and neuritis, unspecified: Secondary | ICD-10-CM | POA: Diagnosis not present

## 2016-04-19 DIAGNOSIS — M6281 Muscle weakness (generalized): Secondary | ICD-10-CM | POA: Diagnosis not present

## 2016-04-19 DIAGNOSIS — G629 Polyneuropathy, unspecified: Secondary | ICD-10-CM | POA: Diagnosis not present

## 2016-04-19 DIAGNOSIS — I10 Essential (primary) hypertension: Secondary | ICD-10-CM | POA: Diagnosis not present

## 2016-04-23 DIAGNOSIS — M869 Osteomyelitis, unspecified: Secondary | ICD-10-CM | POA: Diagnosis not present

## 2016-04-23 DIAGNOSIS — G8929 Other chronic pain: Secondary | ICD-10-CM | POA: Diagnosis not present

## 2016-04-23 DIAGNOSIS — M5126 Other intervertebral disc displacement, lumbar region: Secondary | ICD-10-CM | POA: Diagnosis not present

## 2016-04-24 DIAGNOSIS — M6281 Muscle weakness (generalized): Secondary | ICD-10-CM | POA: Diagnosis not present

## 2016-04-24 DIAGNOSIS — G629 Polyneuropathy, unspecified: Secondary | ICD-10-CM | POA: Diagnosis not present

## 2016-04-24 DIAGNOSIS — M792 Neuralgia and neuritis, unspecified: Secondary | ICD-10-CM | POA: Diagnosis not present

## 2016-04-24 DIAGNOSIS — M150 Primary generalized (osteo)arthritis: Secondary | ICD-10-CM | POA: Diagnosis not present

## 2016-04-24 DIAGNOSIS — I10 Essential (primary) hypertension: Secondary | ICD-10-CM | POA: Diagnosis not present

## 2016-04-24 DIAGNOSIS — M455 Ankylosing spondylitis of thoracolumbar region: Secondary | ICD-10-CM | POA: Diagnosis not present

## 2016-04-26 DIAGNOSIS — M6281 Muscle weakness (generalized): Secondary | ICD-10-CM | POA: Diagnosis not present

## 2016-04-26 DIAGNOSIS — I10 Essential (primary) hypertension: Secondary | ICD-10-CM | POA: Diagnosis not present

## 2016-04-26 DIAGNOSIS — G629 Polyneuropathy, unspecified: Secondary | ICD-10-CM | POA: Diagnosis not present

## 2016-04-26 DIAGNOSIS — M455 Ankylosing spondylitis of thoracolumbar region: Secondary | ICD-10-CM | POA: Diagnosis not present

## 2016-04-26 DIAGNOSIS — M150 Primary generalized (osteo)arthritis: Secondary | ICD-10-CM | POA: Diagnosis not present

## 2016-04-26 DIAGNOSIS — M792 Neuralgia and neuritis, unspecified: Secondary | ICD-10-CM | POA: Diagnosis not present

## 2016-05-01 DIAGNOSIS — M455 Ankylosing spondylitis of thoracolumbar region: Secondary | ICD-10-CM | POA: Diagnosis not present

## 2016-05-01 DIAGNOSIS — M6281 Muscle weakness (generalized): Secondary | ICD-10-CM | POA: Diagnosis not present

## 2016-05-01 DIAGNOSIS — G629 Polyneuropathy, unspecified: Secondary | ICD-10-CM | POA: Diagnosis not present

## 2016-05-01 DIAGNOSIS — M792 Neuralgia and neuritis, unspecified: Secondary | ICD-10-CM | POA: Diagnosis not present

## 2016-05-01 DIAGNOSIS — M150 Primary generalized (osteo)arthritis: Secondary | ICD-10-CM | POA: Diagnosis not present

## 2016-05-01 DIAGNOSIS — I10 Essential (primary) hypertension: Secondary | ICD-10-CM | POA: Diagnosis not present

## 2016-05-03 DIAGNOSIS — M150 Primary generalized (osteo)arthritis: Secondary | ICD-10-CM | POA: Diagnosis not present

## 2016-05-03 DIAGNOSIS — I10 Essential (primary) hypertension: Secondary | ICD-10-CM | POA: Diagnosis not present

## 2016-05-03 DIAGNOSIS — G629 Polyneuropathy, unspecified: Secondary | ICD-10-CM | POA: Diagnosis not present

## 2016-05-03 DIAGNOSIS — M6281 Muscle weakness (generalized): Secondary | ICD-10-CM | POA: Diagnosis not present

## 2016-05-03 DIAGNOSIS — M792 Neuralgia and neuritis, unspecified: Secondary | ICD-10-CM | POA: Diagnosis not present

## 2016-05-03 DIAGNOSIS — M455 Ankylosing spondylitis of thoracolumbar region: Secondary | ICD-10-CM | POA: Diagnosis not present

## 2016-05-08 DIAGNOSIS — I10 Essential (primary) hypertension: Secondary | ICD-10-CM | POA: Diagnosis not present

## 2016-05-08 DIAGNOSIS — M150 Primary generalized (osteo)arthritis: Secondary | ICD-10-CM | POA: Diagnosis not present

## 2016-05-08 DIAGNOSIS — M6281 Muscle weakness (generalized): Secondary | ICD-10-CM | POA: Diagnosis not present

## 2016-05-08 DIAGNOSIS — G629 Polyneuropathy, unspecified: Secondary | ICD-10-CM | POA: Diagnosis not present

## 2016-05-08 DIAGNOSIS — M792 Neuralgia and neuritis, unspecified: Secondary | ICD-10-CM | POA: Diagnosis not present

## 2016-05-08 DIAGNOSIS — M455 Ankylosing spondylitis of thoracolumbar region: Secondary | ICD-10-CM | POA: Diagnosis not present

## 2016-05-10 DIAGNOSIS — I10 Essential (primary) hypertension: Secondary | ICD-10-CM | POA: Diagnosis not present

## 2016-05-10 DIAGNOSIS — M455 Ankylosing spondylitis of thoracolumbar region: Secondary | ICD-10-CM | POA: Diagnosis not present

## 2016-05-10 DIAGNOSIS — M6281 Muscle weakness (generalized): Secondary | ICD-10-CM | POA: Diagnosis not present

## 2016-05-10 DIAGNOSIS — G629 Polyneuropathy, unspecified: Secondary | ICD-10-CM | POA: Diagnosis not present

## 2016-05-10 DIAGNOSIS — M792 Neuralgia and neuritis, unspecified: Secondary | ICD-10-CM | POA: Diagnosis not present

## 2016-05-10 DIAGNOSIS — M150 Primary generalized (osteo)arthritis: Secondary | ICD-10-CM | POA: Diagnosis not present

## 2016-05-15 DIAGNOSIS — M6281 Muscle weakness (generalized): Secondary | ICD-10-CM | POA: Diagnosis not present

## 2016-05-15 DIAGNOSIS — G629 Polyneuropathy, unspecified: Secondary | ICD-10-CM | POA: Diagnosis not present

## 2016-05-15 DIAGNOSIS — M150 Primary generalized (osteo)arthritis: Secondary | ICD-10-CM | POA: Diagnosis not present

## 2016-05-15 DIAGNOSIS — I10 Essential (primary) hypertension: Secondary | ICD-10-CM | POA: Diagnosis not present

## 2016-05-15 DIAGNOSIS — M792 Neuralgia and neuritis, unspecified: Secondary | ICD-10-CM | POA: Diagnosis not present

## 2016-05-15 DIAGNOSIS — M455 Ankylosing spondylitis of thoracolumbar region: Secondary | ICD-10-CM | POA: Diagnosis not present

## 2016-05-18 DIAGNOSIS — M150 Primary generalized (osteo)arthritis: Secondary | ICD-10-CM | POA: Diagnosis not present

## 2016-05-18 DIAGNOSIS — I10 Essential (primary) hypertension: Secondary | ICD-10-CM | POA: Diagnosis not present

## 2016-05-18 DIAGNOSIS — G629 Polyneuropathy, unspecified: Secondary | ICD-10-CM | POA: Diagnosis not present

## 2016-05-18 DIAGNOSIS — M792 Neuralgia and neuritis, unspecified: Secondary | ICD-10-CM | POA: Diagnosis not present

## 2016-05-18 DIAGNOSIS — M455 Ankylosing spondylitis of thoracolumbar region: Secondary | ICD-10-CM | POA: Diagnosis not present

## 2016-05-18 DIAGNOSIS — M6281 Muscle weakness (generalized): Secondary | ICD-10-CM | POA: Diagnosis not present

## 2016-05-22 ENCOUNTER — Other Ambulatory Visit: Payer: Self-pay | Admitting: Neurosurgery

## 2016-05-22 DIAGNOSIS — M5136 Other intervertebral disc degeneration, lumbar region: Secondary | ICD-10-CM | POA: Diagnosis not present

## 2016-05-22 DIAGNOSIS — I10 Essential (primary) hypertension: Secondary | ICD-10-CM | POA: Diagnosis not present

## 2016-05-22 DIAGNOSIS — M5137 Other intervertebral disc degeneration, lumbosacral region: Secondary | ICD-10-CM | POA: Diagnosis not present

## 2016-05-22 DIAGNOSIS — Z6841 Body Mass Index (BMI) 40.0 and over, adult: Secondary | ICD-10-CM | POA: Diagnosis not present

## 2016-05-27 IMAGING — DX DG CHEST 2V
2 series · 2 of 2 positions shown · non-contrast
Comparison: 04/21/2014

CLINICAL DATA: Low back pain, chest pain

EXAM:
CHEST  2 VIEW

[chest lat]
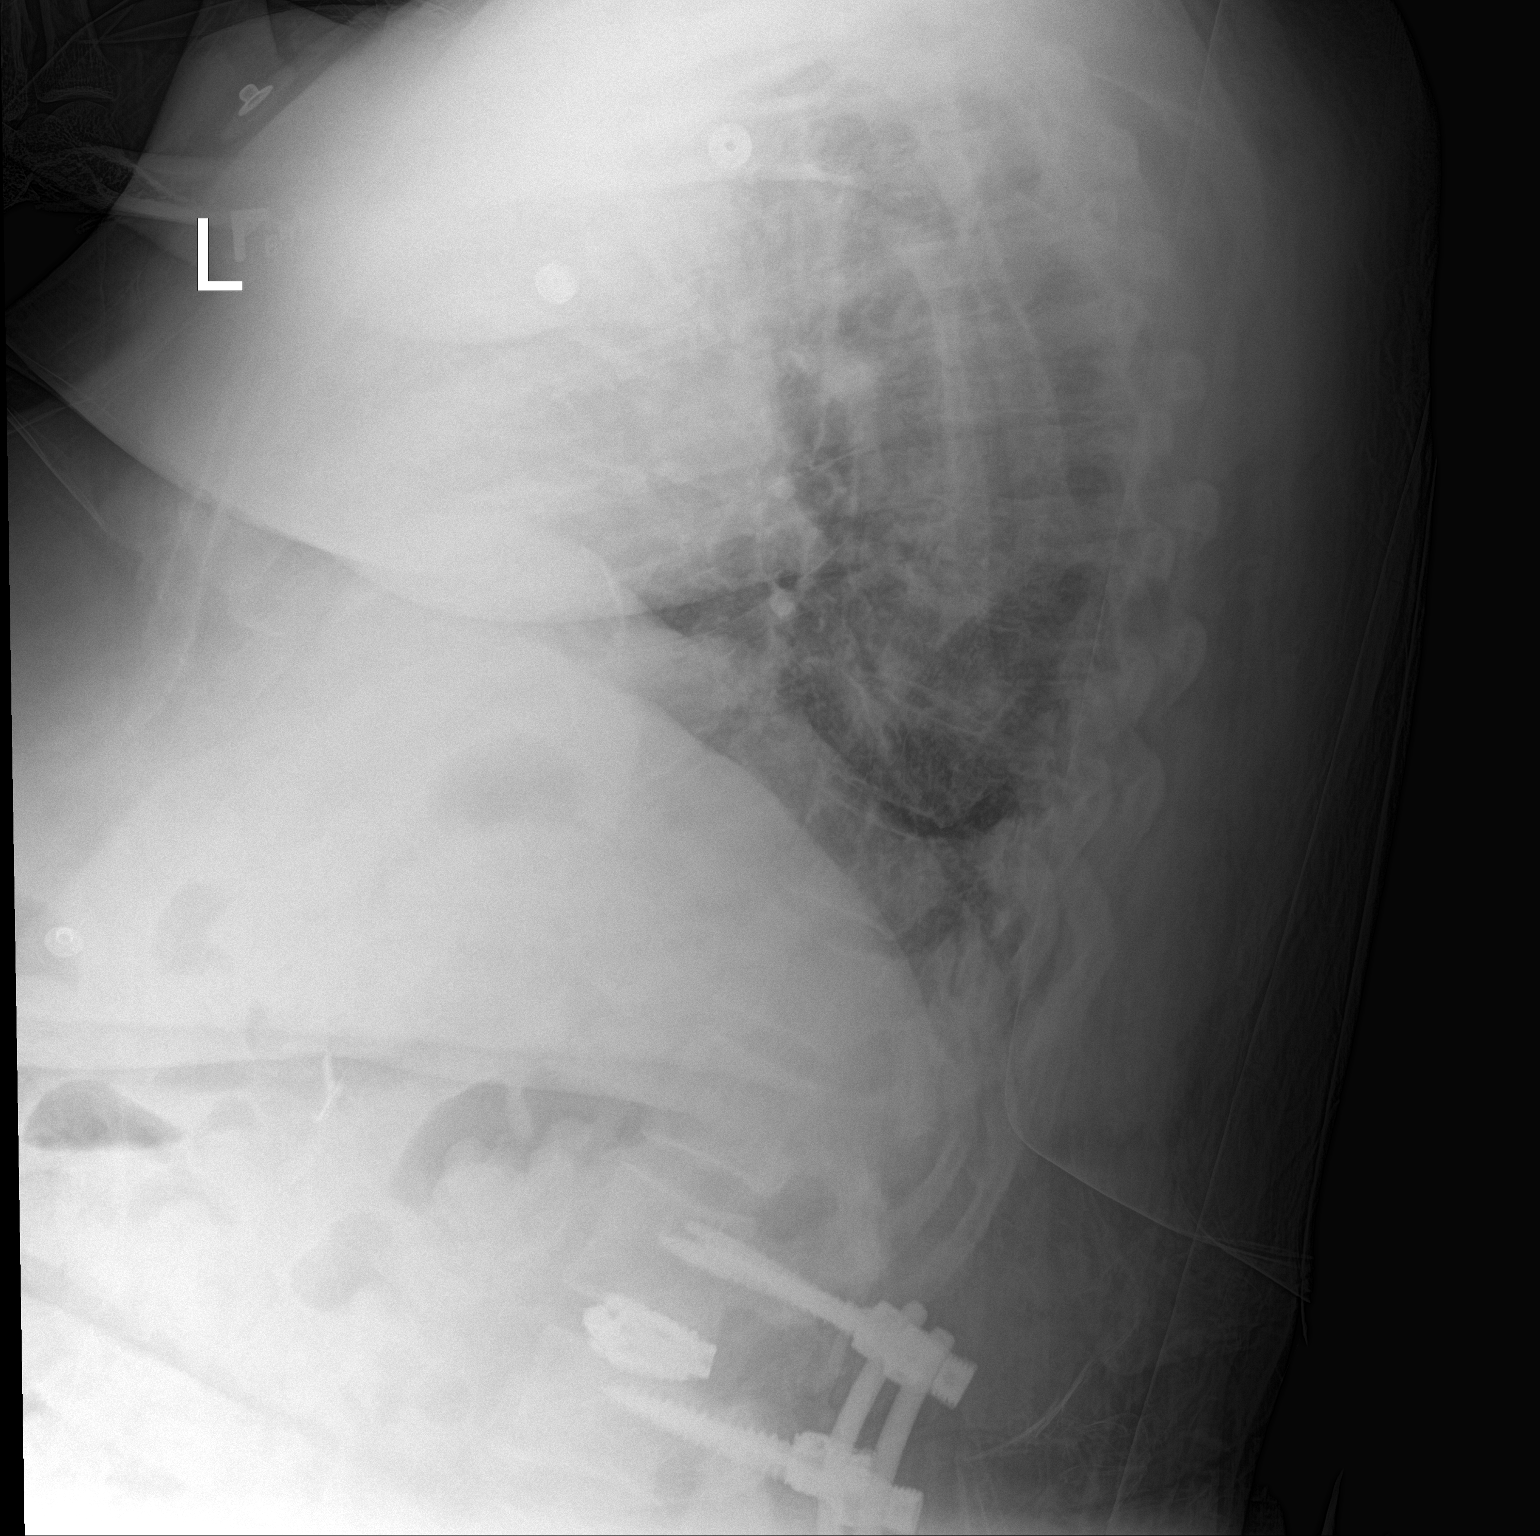

[chest ap]
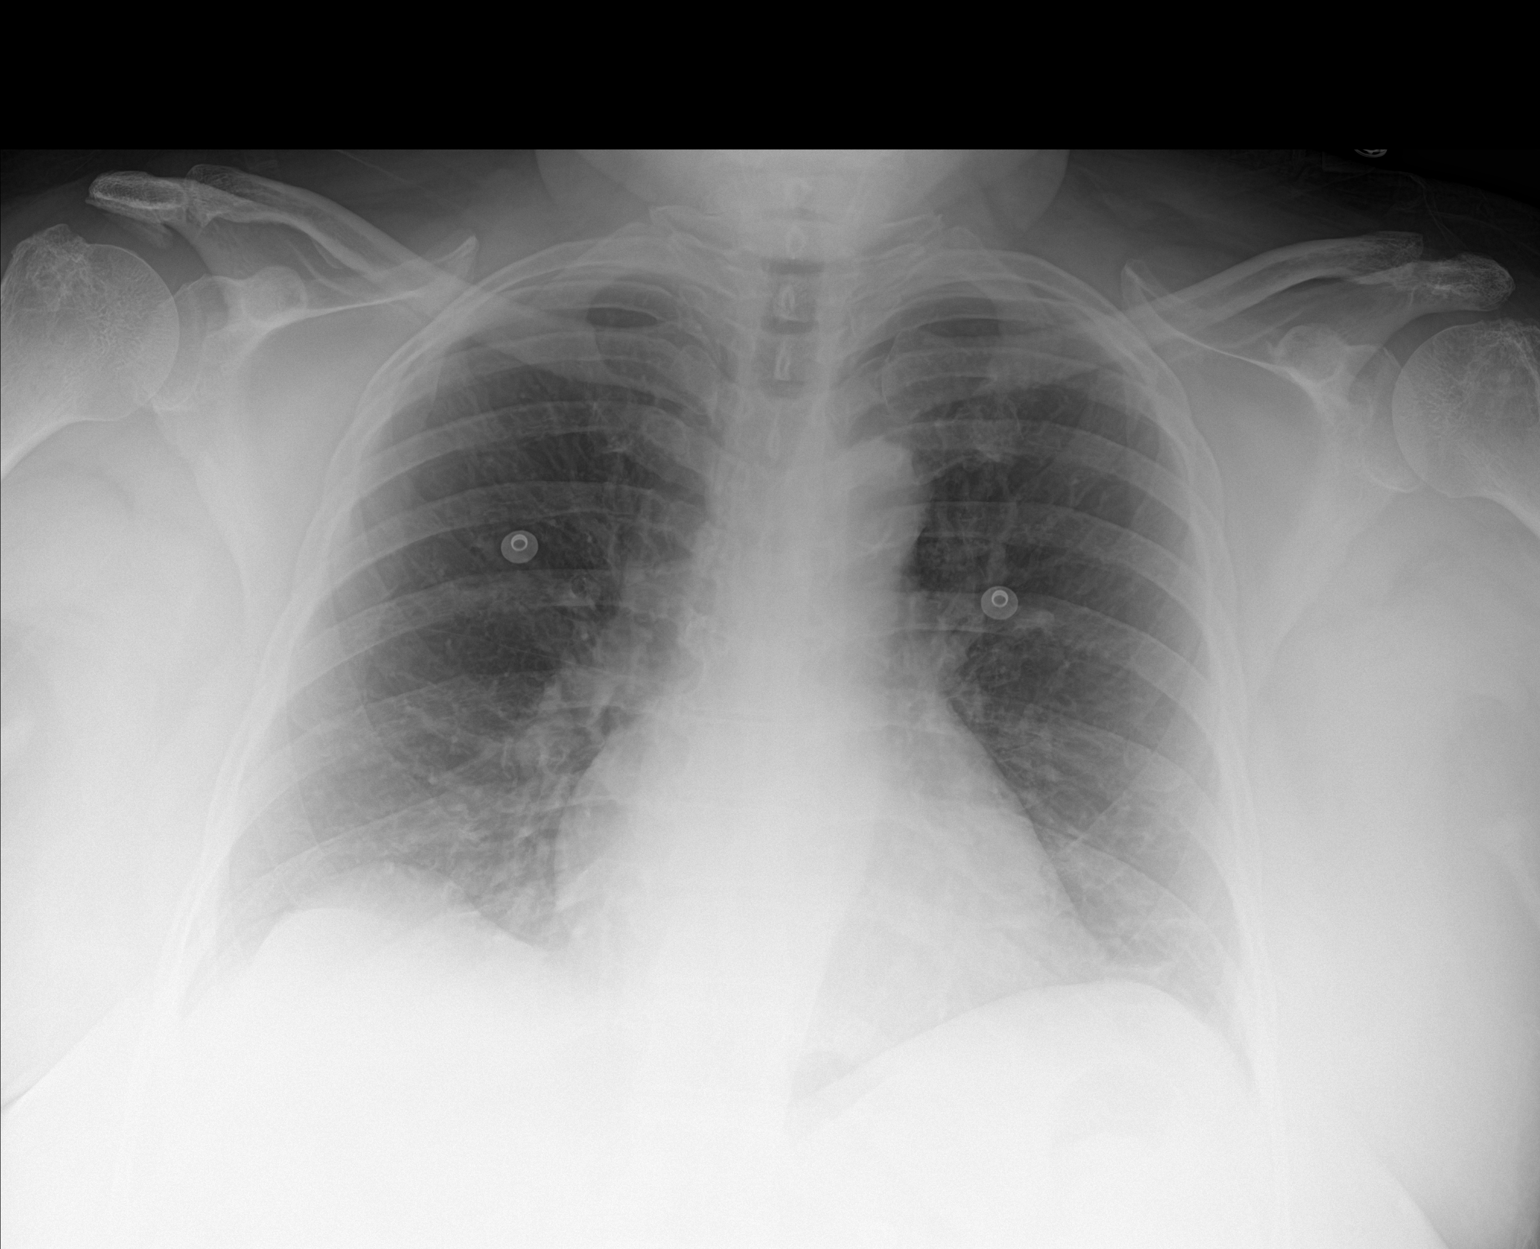

[2 of 2 positions shown; findings below may reference images not displayed]

FINDINGS: The heart size and mediastinal contours are within normal limits.
Both lungs are clear. The visualized skeletal structures are
unremarkable.
IMPRESSION: No active cardiopulmonary disease.

## 2016-05-28 ENCOUNTER — Other Ambulatory Visit: Payer: Self-pay | Admitting: Internal Medicine

## 2016-06-01 ENCOUNTER — Ambulatory Visit
Admission: RE | Admit: 2016-06-01 | Discharge: 2016-06-01 | Disposition: A | Discharge status: Home / Self Care | Payer: Medicare Other | Source: Ambulatory Visit | Attending: Neurosurgery | Admitting: Neurosurgery

## 2016-06-01 DIAGNOSIS — M4326 Fusion of spine, lumbar region: Secondary | ICD-10-CM | POA: Diagnosis not present

## 2016-06-01 DIAGNOSIS — M5136 Other intervertebral disc degeneration, lumbar region: Secondary | ICD-10-CM

## 2016-06-05 DIAGNOSIS — M5137 Other intervertebral disc degeneration, lumbosacral region: Secondary | ICD-10-CM | POA: Diagnosis not present

## 2016-06-05 DIAGNOSIS — Z6841 Body Mass Index (BMI) 40.0 and over, adult: Secondary | ICD-10-CM | POA: Diagnosis not present

## 2016-06-05 DIAGNOSIS — I10 Essential (primary) hypertension: Secondary | ICD-10-CM | POA: Diagnosis not present

## 2016-06-06 DIAGNOSIS — M5136 Other intervertebral disc degeneration, lumbar region: Secondary | ICD-10-CM | POA: Diagnosis not present

## 2016-06-06 DIAGNOSIS — M5137 Other intervertebral disc degeneration, lumbosacral region: Secondary | ICD-10-CM | POA: Diagnosis not present

## 2016-06-07 DIAGNOSIS — M961 Postlaminectomy syndrome, not elsewhere classified: Secondary | ICD-10-CM | POA: Diagnosis not present

## 2016-06-07 IMAGING — CR DG CHEST 2V
2 series · 2 of 2 positions shown · non-contrast
Comparison: 12/25/2015

CLINICAL DATA: Postop.  Fever.

EXAM:
CHEST  2 VIEW

[chest pa]
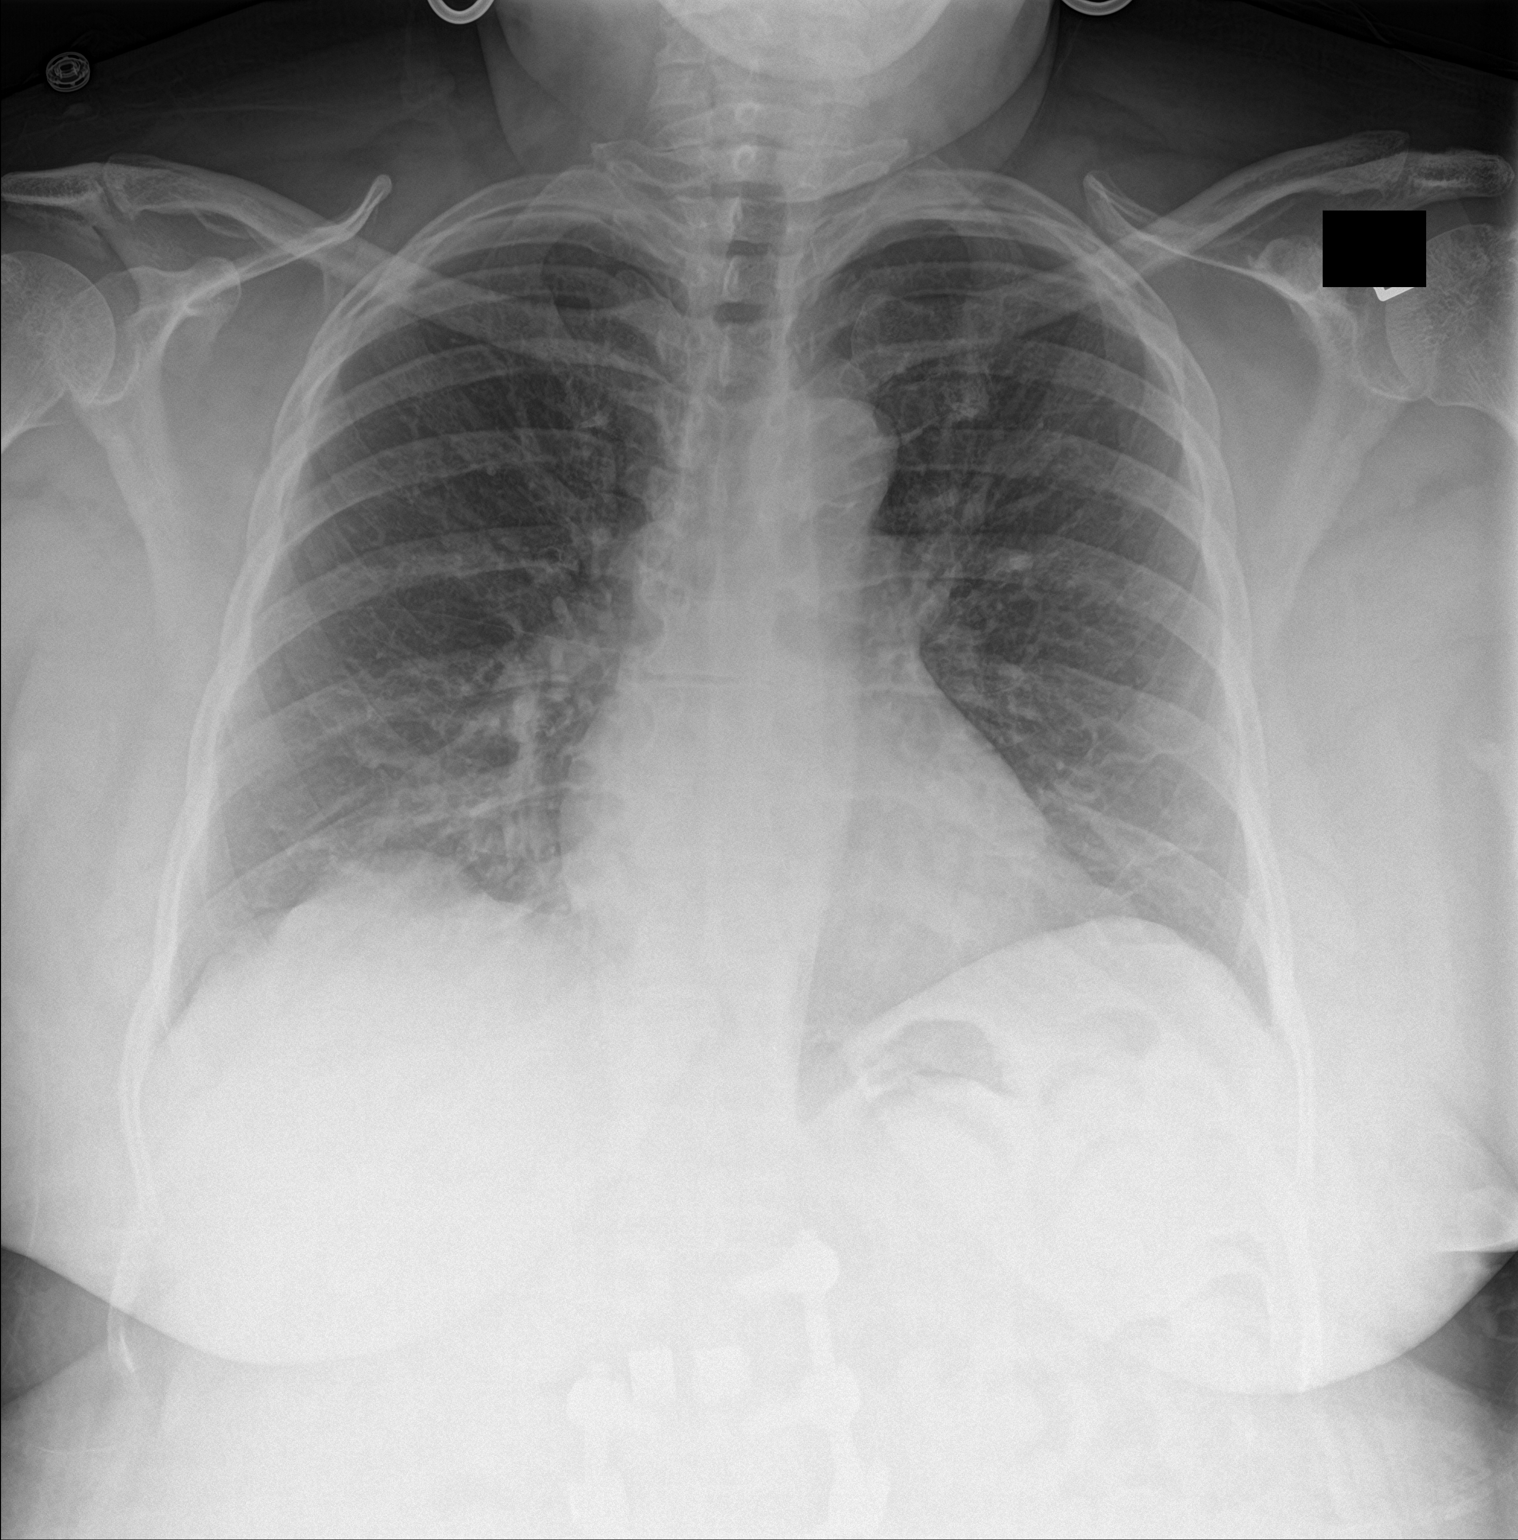

[chest lat]
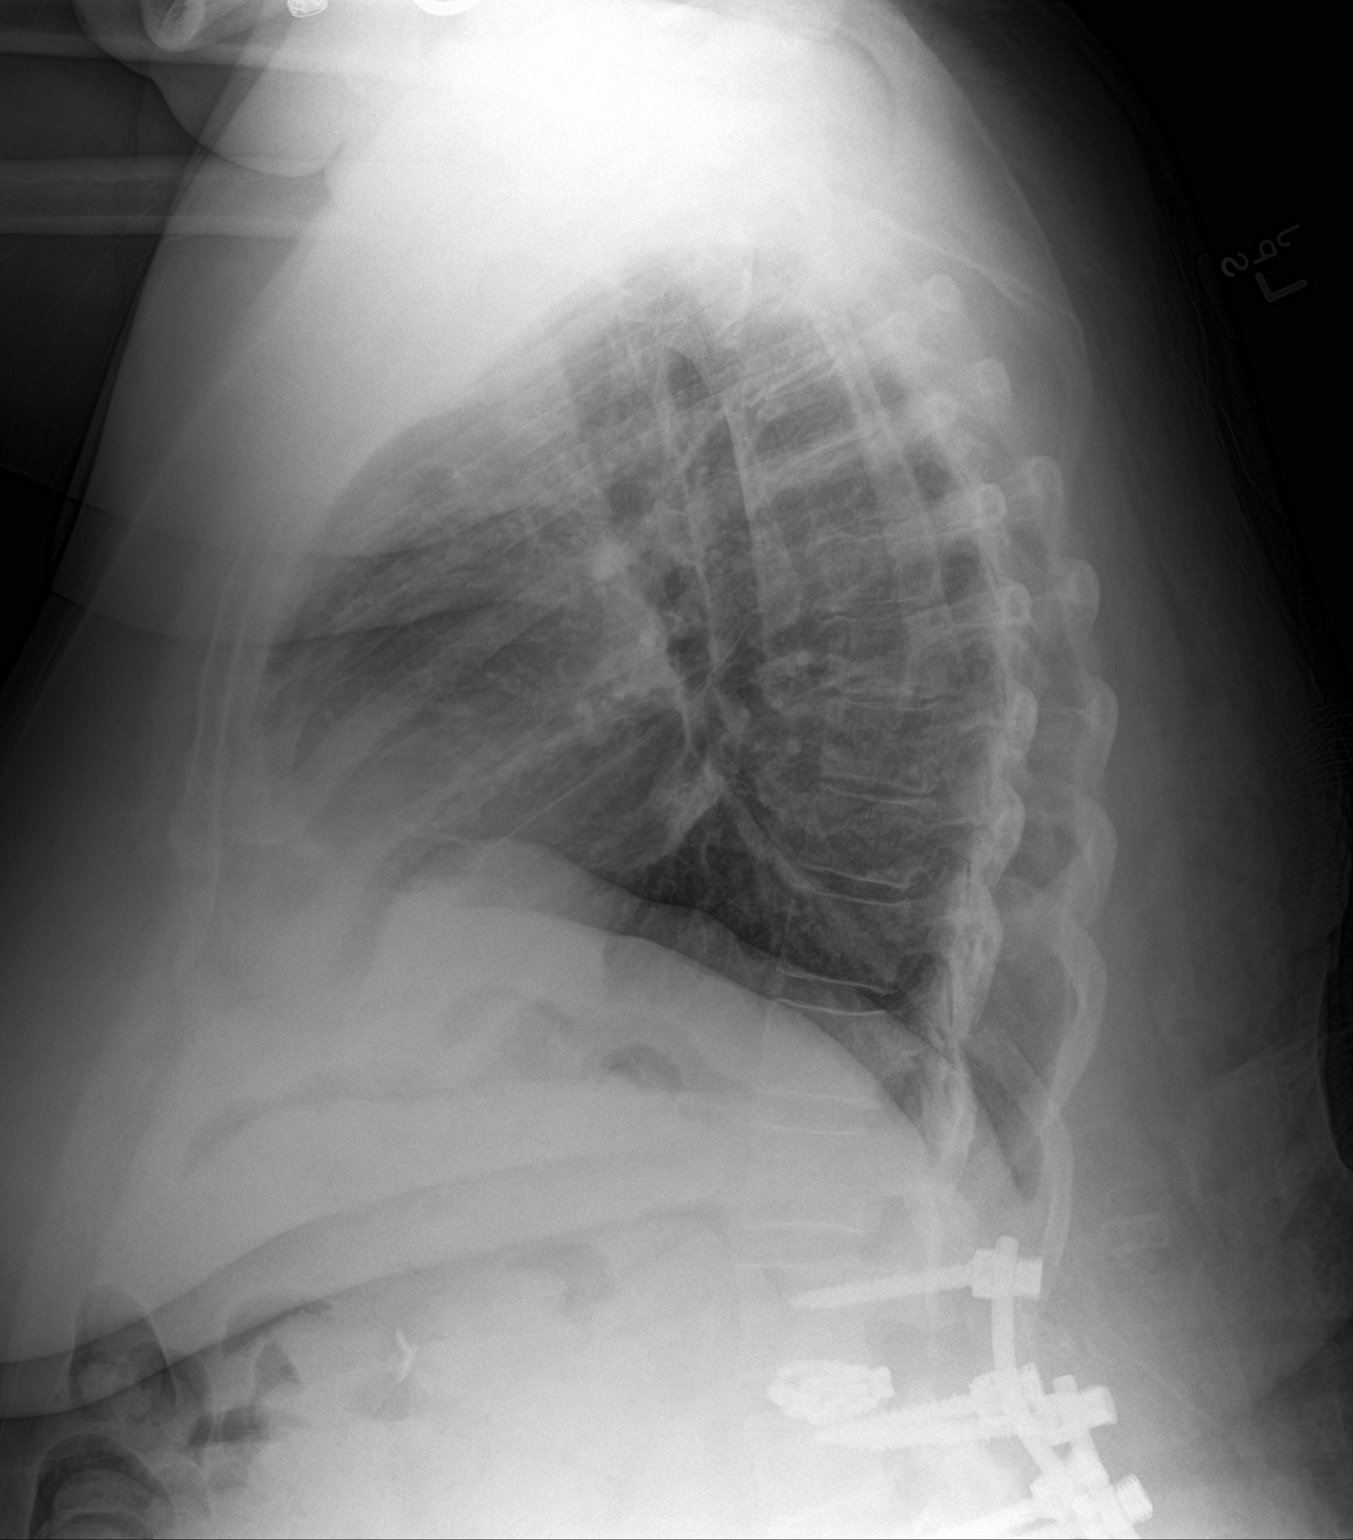

[2 of 2 positions shown; findings below may reference images not displayed]

FINDINGS: Cardiac silhouette is normal in size and configuration. No
mediastinal or hilar masses or evidence of adenopathy.

Lungs are clear.  No pleural effusion or pneumothorax.

Bony thorax is demineralized but intact.
IMPRESSION: No active cardiopulmonary disease.

## 2016-06-14 DIAGNOSIS — J309 Allergic rhinitis, unspecified: Secondary | ICD-10-CM | POA: Diagnosis not present

## 2016-06-14 DIAGNOSIS — F39 Unspecified mood [affective] disorder: Secondary | ICD-10-CM | POA: Diagnosis not present

## 2016-06-14 DIAGNOSIS — I10 Essential (primary) hypertension: Secondary | ICD-10-CM | POA: Diagnosis not present

## 2016-06-14 DIAGNOSIS — G8929 Other chronic pain: Secondary | ICD-10-CM | POA: Diagnosis not present

## 2016-06-14 DIAGNOSIS — M797 Fibromyalgia: Secondary | ICD-10-CM | POA: Diagnosis not present

## 2016-06-14 DIAGNOSIS — M4806 Spinal stenosis, lumbar region: Secondary | ICD-10-CM | POA: Diagnosis not present

## 2016-06-14 DIAGNOSIS — K224 Dyskinesia of esophagus: Secondary | ICD-10-CM | POA: Diagnosis not present

## 2016-06-14 DIAGNOSIS — Z23 Encounter for immunization: Secondary | ICD-10-CM | POA: Diagnosis not present

## 2016-06-24 DIAGNOSIS — K56609 Unspecified intestinal obstruction, unspecified as to partial versus complete obstruction: Secondary | ICD-10-CM

## 2016-06-24 HISTORY — DX: Unspecified intestinal obstruction, unspecified as to partial versus complete obstruction: K56.609

## 2016-06-26 ENCOUNTER — Ambulatory Visit: Payer: Medicare Other | Attending: Pain Medicine | Admitting: Pain Medicine

## 2016-06-26 ENCOUNTER — Encounter: Payer: Self-pay | Admitting: Pain Medicine

## 2016-06-26 VITALS — BP 159/88 | HR 82 | Temp 98.5°F | Resp 20 | Ht 65.0 in | Wt 260.0 lb

## 2016-06-26 DIAGNOSIS — M75122 Complete rotator cuff tear or rupture of left shoulder, not specified as traumatic: Secondary | ICD-10-CM | POA: Insufficient documentation

## 2016-06-26 DIAGNOSIS — I10 Essential (primary) hypertension: Secondary | ICD-10-CM | POA: Insufficient documentation

## 2016-06-26 DIAGNOSIS — Z880 Allergy status to penicillin: Secondary | ICD-10-CM | POA: Insufficient documentation

## 2016-06-26 DIAGNOSIS — M79671 Pain in right foot: Secondary | ICD-10-CM | POA: Insufficient documentation

## 2016-06-26 DIAGNOSIS — M47816 Spondylosis without myelopathy or radiculopathy, lumbar region: Secondary | ICD-10-CM | POA: Insufficient documentation

## 2016-06-26 DIAGNOSIS — Z87891 Personal history of nicotine dependence: Secondary | ICD-10-CM | POA: Insufficient documentation

## 2016-06-26 DIAGNOSIS — M545 Low back pain, unspecified: Secondary | ICD-10-CM

## 2016-06-26 DIAGNOSIS — M25551 Pain in right hip: Secondary | ICD-10-CM | POA: Insufficient documentation

## 2016-06-26 DIAGNOSIS — I739 Peripheral vascular disease, unspecified: Secondary | ICD-10-CM | POA: Insufficient documentation

## 2016-06-26 DIAGNOSIS — Z6841 Body Mass Index (BMI) 40.0 and over, adult: Secondary | ICD-10-CM | POA: Insufficient documentation

## 2016-06-26 DIAGNOSIS — D171 Benign lipomatous neoplasm of skin and subcutaneous tissue of trunk: Secondary | ICD-10-CM | POA: Diagnosis not present

## 2016-06-26 DIAGNOSIS — E882 Lipomatosis, not elsewhere classified: Secondary | ICD-10-CM | POA: Insufficient documentation

## 2016-06-26 DIAGNOSIS — D1779 Benign lipomatous neoplasm of other sites: Secondary | ICD-10-CM

## 2016-06-26 DIAGNOSIS — M961 Postlaminectomy syndrome, not elsewhere classified: Secondary | ICD-10-CM

## 2016-06-26 DIAGNOSIS — M19011 Primary osteoarthritis, right shoulder: Secondary | ICD-10-CM | POA: Insufficient documentation

## 2016-06-26 DIAGNOSIS — E876 Hypokalemia: Secondary | ICD-10-CM | POA: Diagnosis not present

## 2016-06-26 DIAGNOSIS — R51 Headache: Secondary | ICD-10-CM | POA: Diagnosis not present

## 2016-06-26 DIAGNOSIS — M47896 Other spondylosis, lumbar region: Secondary | ICD-10-CM

## 2016-06-26 DIAGNOSIS — N3289 Other specified disorders of bladder: Secondary | ICD-10-CM | POA: Diagnosis not present

## 2016-06-26 DIAGNOSIS — Z5181 Encounter for therapeutic drug level monitoring: Secondary | ICD-10-CM | POA: Insufficient documentation

## 2016-06-26 DIAGNOSIS — G9619 Other disorders of meninges, not elsewhere classified: Secondary | ICD-10-CM | POA: Insufficient documentation

## 2016-06-26 DIAGNOSIS — M069 Rheumatoid arthritis, unspecified: Secondary | ICD-10-CM | POA: Insufficient documentation

## 2016-06-26 DIAGNOSIS — G629 Polyneuropathy, unspecified: Secondary | ICD-10-CM | POA: Insufficient documentation

## 2016-06-26 DIAGNOSIS — M25552 Pain in left hip: Secondary | ICD-10-CM | POA: Diagnosis not present

## 2016-06-26 DIAGNOSIS — R209 Unspecified disturbances of skin sensation: Secondary | ICD-10-CM | POA: Insufficient documentation

## 2016-06-26 DIAGNOSIS — M25561 Pain in right knee: Secondary | ICD-10-CM

## 2016-06-26 DIAGNOSIS — M4726 Other spondylosis with radiculopathy, lumbar region: Secondary | ICD-10-CM | POA: Diagnosis not present

## 2016-06-26 DIAGNOSIS — M19012 Primary osteoarthritis, left shoulder: Secondary | ICD-10-CM

## 2016-06-26 DIAGNOSIS — M1990 Unspecified osteoarthritis, unspecified site: Secondary | ICD-10-CM

## 2016-06-26 DIAGNOSIS — Z981 Arthrodesis status: Secondary | ICD-10-CM | POA: Diagnosis not present

## 2016-06-26 DIAGNOSIS — F329 Major depressive disorder, single episode, unspecified: Secondary | ICD-10-CM | POA: Diagnosis not present

## 2016-06-26 DIAGNOSIS — R200 Anesthesia of skin: Secondary | ICD-10-CM | POA: Insufficient documentation

## 2016-06-26 DIAGNOSIS — K5900 Constipation, unspecified: Secondary | ICD-10-CM | POA: Diagnosis not present

## 2016-06-26 DIAGNOSIS — M79602 Pain in left arm: Secondary | ICD-10-CM | POA: Insufficient documentation

## 2016-06-26 DIAGNOSIS — G4733 Obstructive sleep apnea (adult) (pediatric): Secondary | ICD-10-CM | POA: Diagnosis not present

## 2016-06-26 DIAGNOSIS — M25512 Pain in left shoulder: Secondary | ICD-10-CM

## 2016-06-26 DIAGNOSIS — M160 Bilateral primary osteoarthritis of hip: Secondary | ICD-10-CM | POA: Diagnosis not present

## 2016-06-26 DIAGNOSIS — M48061 Spinal stenosis, lumbar region without neurogenic claudication: Secondary | ICD-10-CM | POA: Insufficient documentation

## 2016-06-26 DIAGNOSIS — M797 Fibromyalgia: Secondary | ICD-10-CM | POA: Insufficient documentation

## 2016-06-26 DIAGNOSIS — Z0189 Encounter for other specified special examinations: Secondary | ICD-10-CM

## 2016-06-26 DIAGNOSIS — Z79899 Other long term (current) drug therapy: Secondary | ICD-10-CM | POA: Insufficient documentation

## 2016-06-26 DIAGNOSIS — K219 Gastro-esophageal reflux disease without esophagitis: Secondary | ICD-10-CM | POA: Insufficient documentation

## 2016-06-26 DIAGNOSIS — F419 Anxiety disorder, unspecified: Secondary | ICD-10-CM | POA: Insufficient documentation

## 2016-06-26 DIAGNOSIS — G96198 Other disorders of meninges, not elsewhere classified: Secondary | ICD-10-CM

## 2016-06-26 DIAGNOSIS — M792 Neuralgia and neuritis, unspecified: Secondary | ICD-10-CM

## 2016-06-26 DIAGNOSIS — R531 Weakness: Secondary | ICD-10-CM | POA: Insufficient documentation

## 2016-06-26 DIAGNOSIS — M25562 Pain in left knee: Secondary | ICD-10-CM

## 2016-06-26 DIAGNOSIS — R519 Headache, unspecified: Secondary | ICD-10-CM | POA: Insufficient documentation

## 2016-06-26 DIAGNOSIS — G894 Chronic pain syndrome: Secondary | ICD-10-CM | POA: Insufficient documentation

## 2016-06-26 DIAGNOSIS — J45909 Unspecified asthma, uncomplicated: Secondary | ICD-10-CM | POA: Diagnosis not present

## 2016-06-26 DIAGNOSIS — F1190 Opioid use, unspecified, uncomplicated: Secondary | ICD-10-CM | POA: Diagnosis not present

## 2016-06-26 DIAGNOSIS — M25511 Pain in right shoulder: Secondary | ICD-10-CM

## 2016-06-26 DIAGNOSIS — H409 Unspecified glaucoma: Secondary | ICD-10-CM | POA: Insufficient documentation

## 2016-06-26 DIAGNOSIS — G8929 Other chronic pain: Secondary | ICD-10-CM | POA: Diagnosis not present

## 2016-06-26 DIAGNOSIS — M170 Bilateral primary osteoarthritis of knee: Secondary | ICD-10-CM | POA: Diagnosis not present

## 2016-06-26 DIAGNOSIS — M4690 Unspecified inflammatory spondylopathy, site unspecified: Secondary | ICD-10-CM

## 2016-06-26 DIAGNOSIS — Z8614 Personal history of Methicillin resistant Staphylococcus aureus infection: Secondary | ICD-10-CM | POA: Insufficient documentation

## 2016-06-26 DIAGNOSIS — M542 Cervicalgia: Secondary | ICD-10-CM | POA: Diagnosis not present

## 2016-06-26 DIAGNOSIS — Z79891 Long term (current) use of opiate analgesic: Secondary | ICD-10-CM | POA: Diagnosis not present

## 2016-06-26 DIAGNOSIS — M419 Scoliosis, unspecified: Secondary | ICD-10-CM | POA: Insufficient documentation

## 2016-06-26 DIAGNOSIS — M1288 Other specific arthropathies, not elsewhere classified, other specified site: Secondary | ICD-10-CM

## 2016-06-26 DIAGNOSIS — M25559 Pain in unspecified hip: Secondary | ICD-10-CM

## 2016-06-26 DIAGNOSIS — M79672 Pain in left foot: Secondary | ICD-10-CM | POA: Insufficient documentation

## 2016-06-26 DIAGNOSIS — Z86718 Personal history of other venous thrombosis and embolism: Secondary | ICD-10-CM | POA: Insufficient documentation

## 2016-06-26 DIAGNOSIS — R42 Dizziness and giddiness: Secondary | ICD-10-CM | POA: Insufficient documentation

## 2016-06-26 NOTE — Progress Notes (Signed)
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 06/26/2016  . Encounter for pain management planning 06/26/2016  . Chronic pain 06/26/2016  . Disturbance of skin sensation 06/26/2016  . Chronic low back pain (Location of Primary Source of Pain) (Bilateral) (R>L) 06/26/2016  . Chronic hip pain (Location of Secondary source of pain) (Bilateral) (R>L) 06/26/2016  . 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 Chronic shoulder pain (Location of Tertiary source of pain) (Bilateral) (R>L) 06/26/2016  . 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 Chronic knee pain (Bilateral) (R>L) 06/26/2016  . 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 Chronic neck pain (Right) 06/26/2016  . Lumbar spondylosis 06/26/2016  . Lumbar facet syndrome 06/26/2016  . Lumbar facet hypertrophy 06/26/2016  . 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 Neurogenic pain 06/26/2016  . Occipital headaches 06/26/2016  . 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 History of lumbar fusion 06/26/2016   Resolved Ambulatory Problems    Diagnosis Date  Noted  . Cough 01/23/2008  . UTI (lower urinary tract infection) 01/31/2016  . Abdominal pain of unknown etiology 02/05/2016   Past Medical History:  Diagnosis Date  . Abdominal pain of unknown etiology 02/05/2016  . Anxiety   . Arthritis   . Asthma   . Chronic back pain   . Constipation   . Depression   . Dysphonia   . Fibromyalgia   . GERD (gastroesophageal reflux disease)   . Glaucoma   . Headache   . History of DVT (deep vein thrombosis) 1989  . History of hiatal hernia   . History of MRSA infection 2011  . Hypertension   . Irregular heart rate   . Low iron   . Mild obstructive sleep apnea   . Neuropathy ([place])   . Pelvic pain in female   . Peripheral vascular disease ([place])   . Seasonal allergies   . Short of breath on exertion   . Urinary frequency   . Weakness    Constitutional Exam  General appearance: Well nourished, well developed, and well hydrated. In no acute distress Vitals:   06/26/16 1430  BP: (!) 159/88  Pulse: 82  Resp: 20  Temp: 98.5 F (36.9 C)  TempSrc: Oral  SpO2: 99%  Weight: 260 lb (117.9 kg)  Height: 5\' 5"  (1.651 m)  BMI Assessment: Estimated body mass index is 43.27 kg/m as calculated from the following:   Height as of this encounter: 5\' 5"  (1.651 m).   Weight as of this encounter: 260 lb (117.9 kg).   BMI interpretation: (>40 kg/m2) = Extreme obesity (Class III): This  range is associated with a 254% higher incidence of chronic pain. BMI Readings from Last 4 Encounters:  06/26/16 43.27 kg/m  02/24/16 47.83 kg/m  02/13/16 44.26 kg/m  02/06/16 42.93 kg/m   Wt Readings from Last 4 Encounters:  06/26/16 260 lb (117.9 kg)  02/24/16 287 lb 6.4 oz (130.4 kg)  02/13/16 266 lb (120.7 kg)  02/06/16 266 lb (120.7 kg)  Psych/Mental status: Alert and oriented x 3 (person, place, & time) Eyes: PERLA Respiratory: No evidence of acute respiratory distress  Cervical Spine Exam  Inspection: No masses, redness, or swelling Alignment:  Symmetrical Functional ROM: Unrestricted ROM Stability: No instability detected Muscle strength & Tone: Functionally intact Sensory: Unimpaired Palpation: Non-contributory  Upper Extremity (UE) Exam    Side: Right upper extremity  Side: Left upper extremity  Inspection: No masses, redness, swelling, or asymmetry  Inspection: No masses, redness, swelling, or asymmetry  Functional ROM: Unrestricted ROM         Functional ROM: Unrestricted ROM          Muscle strength & Tone: Functionally intact  Muscle strength & Tone: Functionally intact  Sensory: Unimpaired  Sensory: Unimpaired  Palpation: Non-contributory  Palpation: Non-contributory   Thoracic Spine Exam  Inspection: No masses, redness, or swelling Alignment: Symmetrical Functional ROM: Unrestricted ROM Stability: No instability detected Sensory: Unimpaired Muscle strength & Tone: Functionally intact Palpation: Non-contributory  Lumbar Spine Exam  Inspection: Well healed scar from previous spine surgery detected Alignment: Symmetrical Functional ROM: Fused Stability: No instability detected Muscle strength & Tone: Increased muscle tone over affected area Sensory: Movement-associated pain Palpation: Complains of area being tender to palpation Provocative Tests: Lumbar Hyperextension and rotation test: Positive bilaterally for facet joint pain. Patrick's Maneuver: evaluation deferred today              Gait & Posture Assessment  Ambulation: Unassisted Gait: Relatively normal for age and body habitus Posture: WNL   Lower Extremity Exam    Side: Right lower extremity  Side: Left lower extremity  Inspection: No masses, redness, swelling, or asymmetry  Inspection: No masses, redness, swelling, or asymmetry  Functional ROM: Unrestricted ROM          Functional ROM: Unrestricted ROM          Muscle strength & Tone: Able to Toe-walk & Heel-walk without problems  Muscle strength & Tone: Able to Toe-walk & Heel-walk without  problems  Sensory: Unimpaired  Sensory: Unimpaired  Palpation: Non-contributory  Palpation: Non-contributory   Assessment  Primary Diagnosis & Pertinent Problem List: The primary encounter diagnosis was Chronic pain. Diagnoses of Long term current use of opiate analgesic, Long term prescription opiate use, Opiate use, Encounter for therapeutic drug level monitoring, Encounter for pain management planning, Disturbance of skin sensation, Inflammatory spondylopathy, unspecified spinal region [facility]), Chronic midline low back pain without sciatica, Chronic hip pain, unspecified laterality, Primary osteoarthritis of both hips, Chronic shoulder pain (Location of Tertiary source of pain) (Bilateral) (R>L), Primary osteoarthritis of both shoulders, Chronic knee pain (Bilateral) (R>L), Primary osteoarthritis of both knees, Chronic neck pain (Right), Arthritis, Lumbar spondylosis, Lumbar facet syndrome, Lumbar facet hypertrophy, Epidural fibrosis, Epidural lipomatosis, Neurogenic pain, Occipital headaches, Failed back surgical syndrome (5), and History of lumbar fusion were also pertinent to this visit.  Visit Diagnosis: 1. Chronic pain   2. Long term current use of opiate analgesic   3. Long term prescription opiate use   4. Opiate use   5. Encounter for therapeutic drug level monitoring   6. Encounter  for pain management planning   7. Disturbance of skin sensation   8. Inflammatory spondylopathy, unspecified spinal region ([place])   9. Chronic midline low back pain without sciatica   10. Chronic hip pain, unspecified laterality   11. Primary osteoarthritis of both hips   12. Chronic shoulder pain (Location of Tertiary source of pain) (Bilateral) (R>L)   13. Primary osteoarthritis of both shoulders   14. Chronic knee pain (Bilateral) (R>L)   15. Primary osteoarthritis of both knees   16. Chronic neck pain (Right)   17. Arthritis   18. Lumbar spondylosis   19. Lumbar facet syndrome   20. Lumbar facet  hypertrophy   21. Epidural fibrosis   22. Epidural lipomatosis   23. Neurogenic pain   24. Occipital headaches   25. Failed back surgical syndrome (5)   26. History of lumbar fusion    Plan of Care  Initial Treatment Plan:  Please be advised that as per protocol, today's visit has been an evaluation only. We have not taken over the patient's controlled substance management.  Problem-Specific Plan: No problem-specific Assessment & Plan notes found for this encounter.  Ordered Lab-work, Procedure(s), & Referral(s) : Orders Placed This Encounter  Procedures  . DG Cervical Spine Complete  . DG Si Joints  . DG Shoulder Left  . DG Shoulder Right  . DG HIP UNILAT W OR W/O PELVIS 2-3 VIEWS LEFT  . DG HIP UNILAT W OR W/O PELVIS 2-3 VIEWS RIGHT  . DG Knee 1-2 Views Left  . DG Knee 1-2 Views Right  . DG Lumbar Spine Complete W/Bend  . Compliance Drug Analysis, Ur  . Magnesium  . Sedimentation rate  . Vitamin B12  . 25-Hydroxyvitamin D Lcms D2+D3  . Ambulatory referral to Psychology  Referral(s) or Consult(s): Medical psychology consult for substance use disorder evaluation Pharmacotherapy: Medications ordered:  No orders of the defined types were placed in this encounter.  Medications administered during this visit: Ms. Devries had no medications administered during this visit.   Pharmacotherapy plan under consideration:  The patient indicates having tried some tramadol as well as some Percocet, both of which she indicated did not work in controlling her pain. This could possibly suggest that the pain is not somatic but neurogenic or neuropathic.    Interventional Therapies: Interventional procedures under consideration:  The patient's primary pain is that of the lower back with pain on hyperextension and rotation as well as radiological evidence of lumbar facet hypertrophy which strongly suggest the possibility of a lumbar facet syndrome. Diagnostic bilateral lumbar facet  block under fluoroscopic guidance and IV sedation.    Requested PM Follow-up: Return for 2nd Visit Eval, After [facility] Eval.  No future appointments.  Primary Care Physician: Blanchie Serve, MD Location: [facility] Note by: Kathlen Brunswick. Dossie Arbour, M.D, DABA, DABAPM, DABPM, DABIPP, FIPP  Pain Score Disclaimer: We use the NRS-11 scale. This is a self-reported, subjective measurement of pain severity with only modest accuracy. It is used primarily to identify changes within a particular patient. It must be understood that outpatient pain scales are significantly less accurate that those used for research, where they can be applied under ideal controlled circumstances with minimal exposure to variables. In reality, the score is likely to be a combination of pain intensity and pain affect, where pain affect describes the degree of emotional arousal or changes in action readiness caused by the sensory experience of pain. Factors such as social and work situation, setting, emotional  state, anxiety levels, expectation, and prior pain experience may influence pain perception and show large inter-individual differences that may also be affected by time variables.  Patient instructions provided during this appointment: There are no Patient Instructions on file for this visit.

## 2016-06-27 ENCOUNTER — Other Ambulatory Visit
Admission: RE | Admit: 2016-06-27 | Discharge: 2016-06-27 | Disposition: A | Discharge status: Home / Self Care | Payer: Medicare Other | Source: Ambulatory Visit | Attending: Pain Medicine | Admitting: Pain Medicine

## 2016-06-27 ENCOUNTER — Ambulatory Visit
Admission: RE | Admit: 2016-06-27 | Discharge: 2016-06-27 | Disposition: A | Discharge status: Home / Self Care | Payer: Medicare Other | Source: Ambulatory Visit | Attending: Pain Medicine | Admitting: Pain Medicine

## 2016-06-27 ENCOUNTER — Ambulatory Visit
Admit: 2016-06-27 | Discharge: 2016-06-27 | Disposition: A | Discharge status: Home / Self Care | Payer: Medicare Other | Attending: Pain Medicine | Admitting: Pain Medicine

## 2016-06-27 DIAGNOSIS — M4690 Unspecified inflammatory spondylopathy, site unspecified: Secondary | ICD-10-CM

## 2016-06-27 DIAGNOSIS — Z981 Arthrodesis status: Secondary | ICD-10-CM | POA: Insufficient documentation

## 2016-06-27 DIAGNOSIS — Z9889 Other specified postprocedural states: Secondary | ICD-10-CM | POA: Insufficient documentation

## 2016-06-27 DIAGNOSIS — M25559 Pain in unspecified hip: Secondary | ICD-10-CM | POA: Diagnosis not present

## 2016-06-27 DIAGNOSIS — G8929 Other chronic pain: Secondary | ICD-10-CM

## 2016-06-27 DIAGNOSIS — M8538 Osteitis condensans, other site: Secondary | ICD-10-CM | POA: Insufficient documentation

## 2016-06-27 DIAGNOSIS — M25562 Pain in left knee: Secondary | ICD-10-CM | POA: Insufficient documentation

## 2016-06-27 DIAGNOSIS — M25561 Pain in right knee: Secondary | ICD-10-CM | POA: Diagnosis not present

## 2016-06-27 DIAGNOSIS — R209 Unspecified disturbances of skin sensation: Secondary | ICD-10-CM

## 2016-06-27 DIAGNOSIS — M160 Bilateral primary osteoarthritis of hip: Secondary | ICD-10-CM | POA: Diagnosis not present

## 2016-06-27 DIAGNOSIS — M25512 Pain in left shoulder: Secondary | ICD-10-CM | POA: Insufficient documentation

## 2016-06-27 DIAGNOSIS — M25511 Pain in right shoulder: Secondary | ICD-10-CM

## 2016-06-27 DIAGNOSIS — M542 Cervicalgia: Secondary | ICD-10-CM | POA: Insufficient documentation

## 2016-06-27 DIAGNOSIS — M1612 Unilateral primary osteoarthritis, left hip: Secondary | ICD-10-CM | POA: Diagnosis not present

## 2016-06-27 DIAGNOSIS — M19012 Primary osteoarthritis, left shoulder: Secondary | ICD-10-CM

## 2016-06-27 DIAGNOSIS — M170 Bilateral primary osteoarthritis of knee: Secondary | ICD-10-CM

## 2016-06-27 DIAGNOSIS — M19011 Primary osteoarthritis, right shoulder: Secondary | ICD-10-CM | POA: Insufficient documentation

## 2016-06-27 DIAGNOSIS — M545 Low back pain, unspecified: Secondary | ICD-10-CM

## 2016-06-27 DIAGNOSIS — M5136 Other intervertebral disc degeneration, lumbar region: Secondary | ICD-10-CM | POA: Diagnosis not present

## 2016-06-27 DIAGNOSIS — M47816 Spondylosis without myelopathy or radiculopathy, lumbar region: Secondary | ICD-10-CM | POA: Diagnosis not present

## 2016-06-27 DIAGNOSIS — M179 Osteoarthritis of knee, unspecified: Secondary | ICD-10-CM | POA: Diagnosis not present

## 2016-06-27 DIAGNOSIS — M1611 Unilateral primary osteoarthritis, right hip: Secondary | ICD-10-CM | POA: Diagnosis not present

## 2016-06-27 LAB — SEDIMENTATION RATE: Sed Rate: 31 mm/hr — ABNORMAL HIGH (ref 0–30)

## 2016-06-27 LAB — MAGNESIUM: Magnesium: 2.5 mg/dL — ABNORMAL HIGH (ref 1.7–2.4)

## 2016-06-27 LAB — VITAMIN B12: Vitamin B-12: 2661 pg/mL — ABNORMAL HIGH (ref 180–914)

## 2016-06-29 DIAGNOSIS — F4542 Pain disorder with related psychological factors: Secondary | ICD-10-CM | POA: Diagnosis not present

## 2016-06-30 IMAGING — CT CT RENAL STONE PROTOCOL
3 of 4 series · 10 of 46 positions shown, 17 images · non-contrast
Comparison: None.

CLINICAL DATA: 70-year-old female with bilateral lower abdominal
and pelvic for 2 days with dysuria and urgency. Reported fever.
Recent lumbar surgery performed on 01/02/2016.

EXAM:
CT ABDOMEN AND PELVIS WITHOUT CONTRAST
TECHNIQUE: Multidetector CT imaging of the abdomen and pelvis was performed
following the standard protocol without IV contrast.

[Series 4: lung · axial · 0.86mm/px · z∈[-158,-58]mm · 6 of 28 slices shown, 11 images]
[im 4/28  soft-tissue]
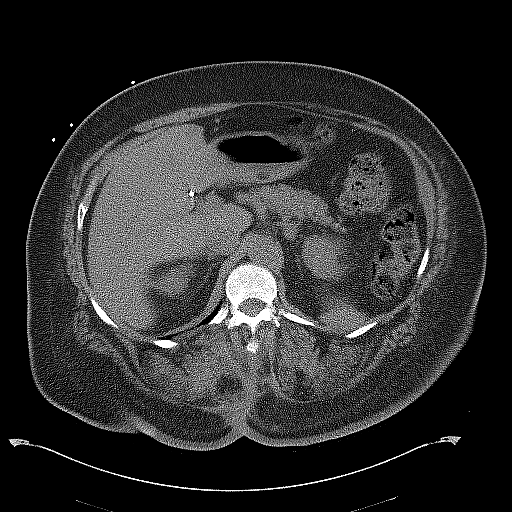
[im 4/28  bone]
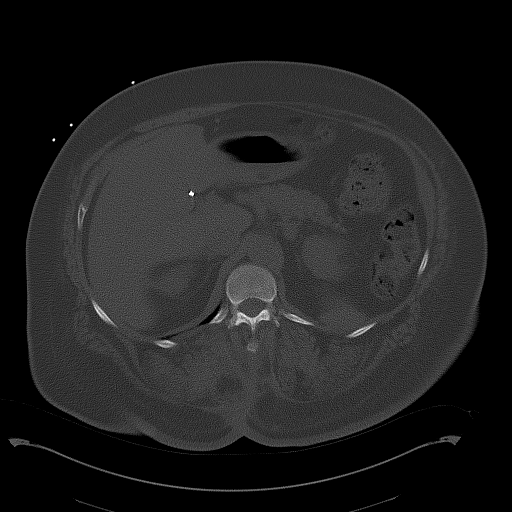
[im 8/28  soft-tissue]
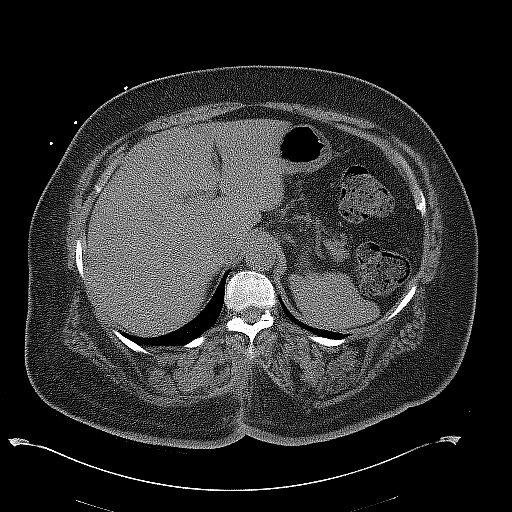
[im 12/28  soft-tissue]
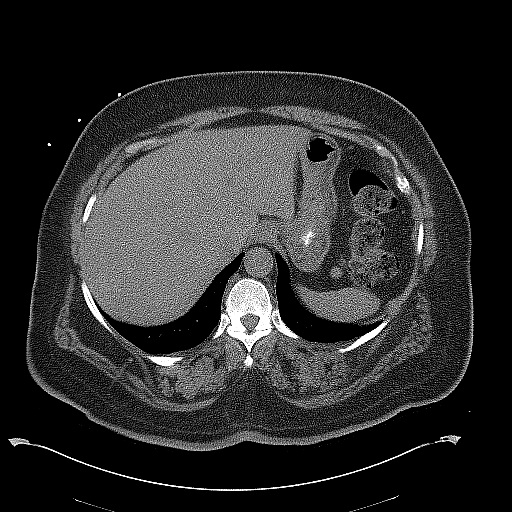
[im 12/28  lung]
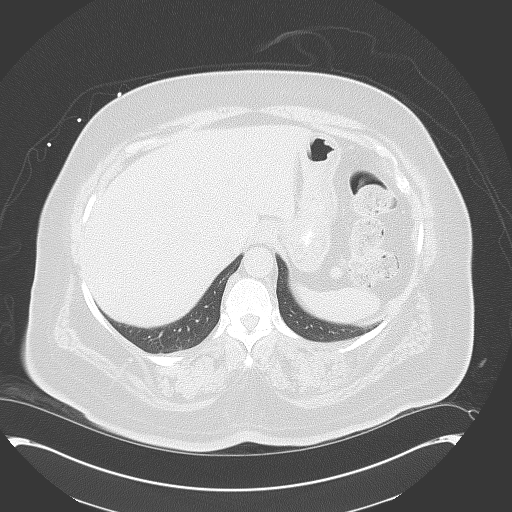
[im 16/28  soft-tissue]
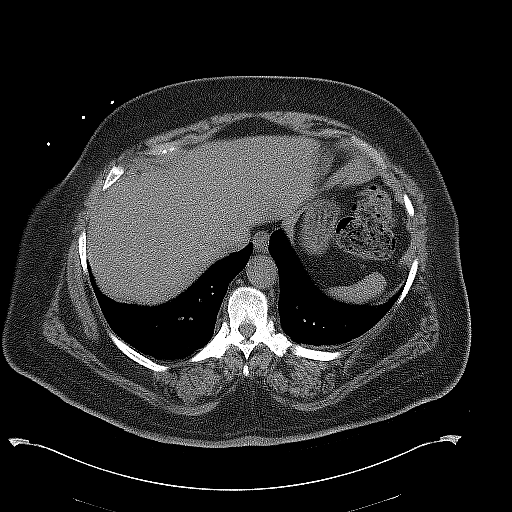
[im 16/28  lung]
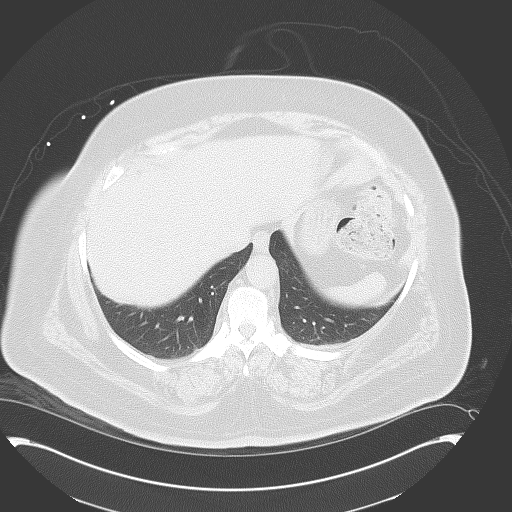
[im 20/28  soft-tissue]
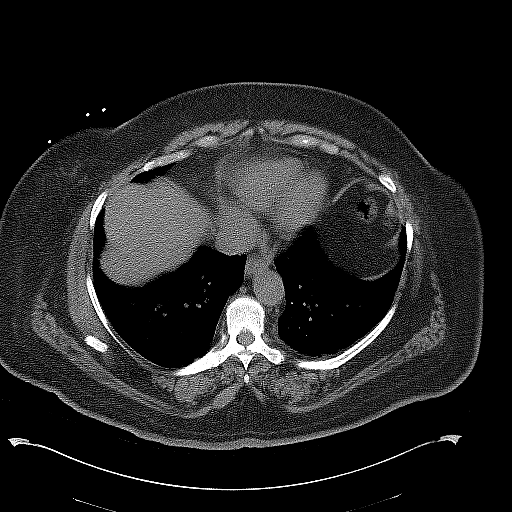
[im 20/28  lung]
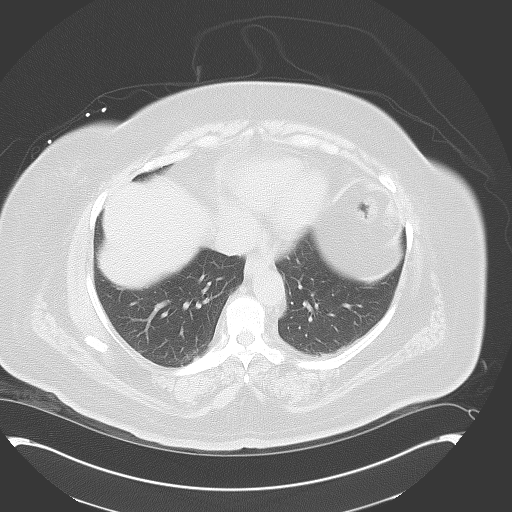
[im 24/28  soft-tissue]
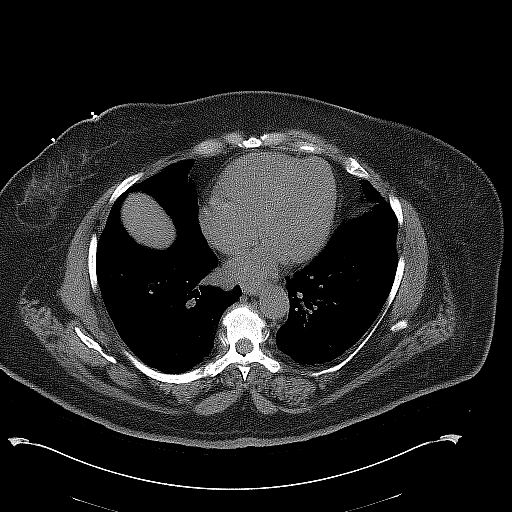
[im 24/28  lung]
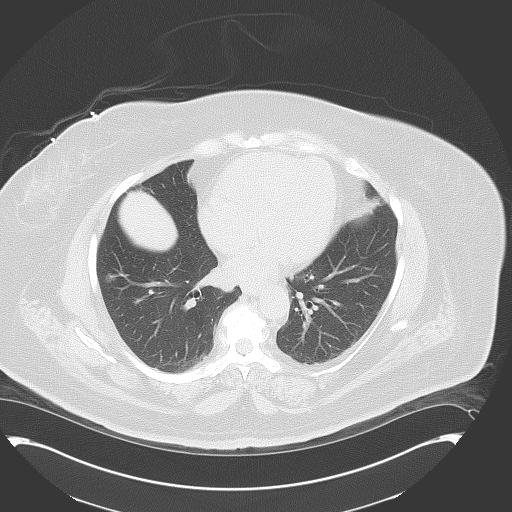

[Series 5: coronal · coronal · 0.81mm/px · 3 of 162 slices shown, 4 images]
[im 54/162  soft-tissue]
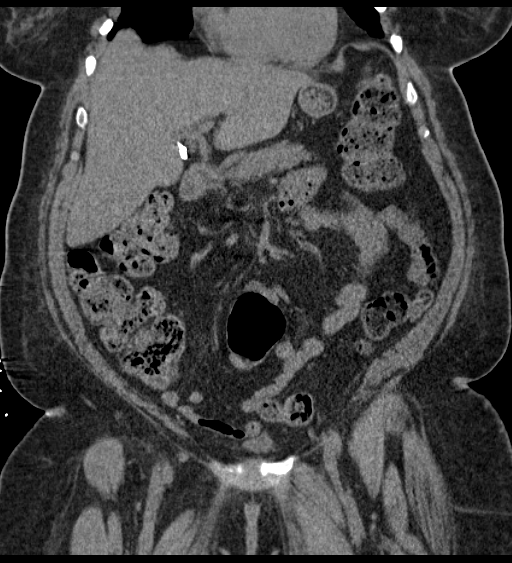
[im 72/162  soft-tissue]
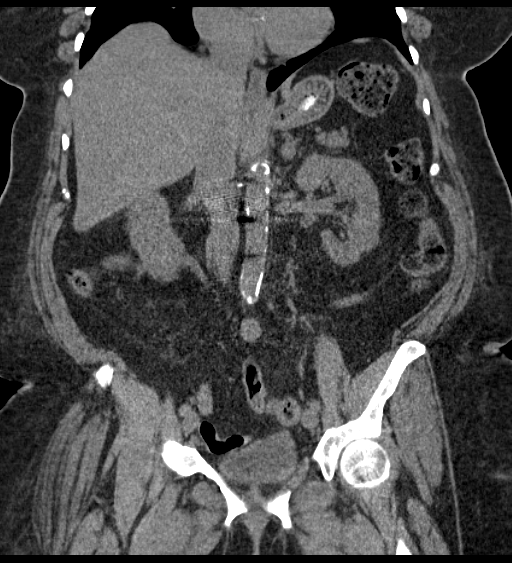
[im 72/162  bone]
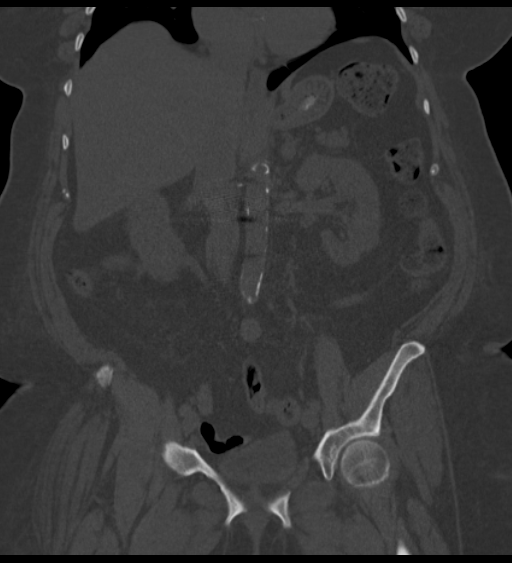
[im 90/162  soft-tissue]
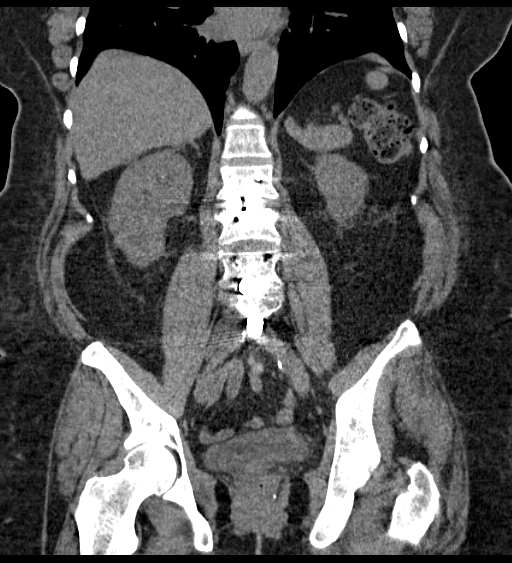

[Series 6: sagittal · sagittal · 0.62mm/px · 1 of 200 slices shown, 2 images]
[im 67/200  soft-tissue]
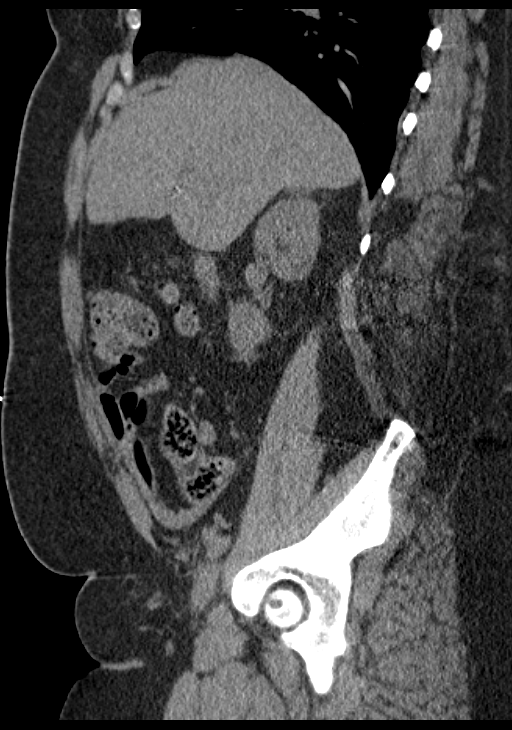
[im 67/200  bone]
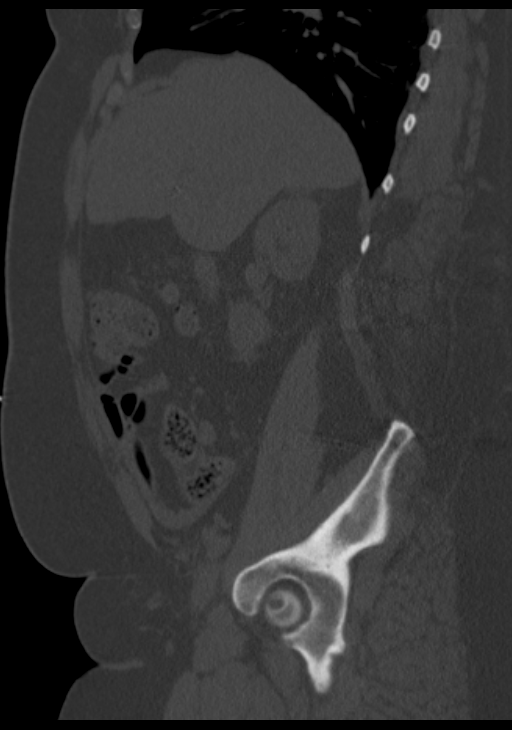

[10 of 46 positions shown; findings below may reference images not displayed]

FINDINGS: Please note that parenchymal abnormalities may be missed without
intravenous contrast.

Lower chest:  Cardiomegaly identified without acute abnormality.

Hepatobiliary: The liver is unremarkable. The patient is status post
cholecystectomy. There is no evidence of biliary dilatation.

Pancreas: Unremarkable.

Spleen: Unremarkable.

Adrenals/Urinary Tract: The kidneys, right adrenal gland and bladder
are unremarkable. There is no evidence of hydronephrosis or urinary
calculi. A 1.5 cm probable left adrenal adenoma noted.

Stomach/Bowel: Unremarkable. There is no evidence of bowel
obstruction or definite focal bowel wall thickening.

Vascular/Lymphatic: Aortic atherosclerotic calcifications noted
without aneurysm. No enlarged lymph nodes identified.

Reproductive: Patient is status post hysterectomy. No adnexal masses
identified.

Other: No free fluid or pneumoperitoneum.

Musculoskeletal: Pedicular and posterior rod fixation is identified
on the right from L2-L4 and on the left from L1-L4. Intervertebral
cages at L1-2 and L3-4 noted. Anterior plate and screw fixation at
L4-5 and L5-S1 noted.

Moderate posterior soft tissue density/stranding in the lumbar
region identified.
IMPRESSION: No evidence of acute abnormality within the abdomen or pelvis.

Postoperative changes within the lumbar spine as described. Consider
further evaluation if there is strong clinical history for
infection.

Cardiomegaly and aortic atherosclerosis.

## 2016-07-02 ENCOUNTER — Inpatient Hospital Stay (HOSPITAL_COMMUNITY): Payer: Medicare Other

## 2016-07-02 ENCOUNTER — Inpatient Hospital Stay (HOSPITAL_COMMUNITY)
Admission: EM | Admit: 2016-07-02 | Discharge: 2016-07-16 | DRG: 336 | Disposition: A | Discharge status: Skilled Nursing Facility | Payer: Medicare Other | Attending: Internal Medicine | Admitting: Internal Medicine

## 2016-07-02 ENCOUNTER — Other Ambulatory Visit: Payer: Self-pay

## 2016-07-02 ENCOUNTER — Emergency Department (HOSPITAL_COMMUNITY): Payer: Medicare Other

## 2016-07-02 ENCOUNTER — Encounter (HOSPITAL_COMMUNITY): Payer: Self-pay

## 2016-07-02 DIAGNOSIS — Z66 Do not resuscitate: Secondary | ICD-10-CM | POA: Diagnosis present

## 2016-07-02 DIAGNOSIS — Z79899 Other long term (current) drug therapy: Secondary | ICD-10-CM

## 2016-07-02 DIAGNOSIS — Z8614 Personal history of Methicillin resistant Staphylococcus aureus infection: Secondary | ICD-10-CM | POA: Diagnosis not present

## 2016-07-02 DIAGNOSIS — J45909 Unspecified asthma, uncomplicated: Secondary | ICD-10-CM | POA: Diagnosis present

## 2016-07-02 DIAGNOSIS — K219 Gastro-esophageal reflux disease without esophagitis: Secondary | ICD-10-CM | POA: Diagnosis present

## 2016-07-02 DIAGNOSIS — J301 Allergic rhinitis due to pollen: Secondary | ICD-10-CM | POA: Diagnosis not present

## 2016-07-02 DIAGNOSIS — Z981 Arthrodesis status: Secondary | ICD-10-CM | POA: Diagnosis not present

## 2016-07-02 DIAGNOSIS — K5669 Other partial intestinal obstruction: Secondary | ICD-10-CM | POA: Diagnosis not present

## 2016-07-02 DIAGNOSIS — Z5331 Laparoscopic surgical procedure converted to open procedure: Secondary | ICD-10-CM | POA: Diagnosis not present

## 2016-07-02 DIAGNOSIS — K56609 Unspecified intestinal obstruction, unspecified as to partial versus complete obstruction: Secondary | ICD-10-CM

## 2016-07-02 DIAGNOSIS — R112 Nausea with vomiting, unspecified: Secondary | ICD-10-CM | POA: Diagnosis not present

## 2016-07-02 DIAGNOSIS — G4733 Obstructive sleep apnea (adult) (pediatric): Secondary | ICD-10-CM | POA: Diagnosis present

## 2016-07-02 DIAGNOSIS — K859 Acute pancreatitis without necrosis or infection, unspecified: Secondary | ICD-10-CM | POA: Diagnosis not present

## 2016-07-02 DIAGNOSIS — R1111 Vomiting without nausea: Secondary | ICD-10-CM | POA: Diagnosis not present

## 2016-07-02 DIAGNOSIS — S31109A Unspecified open wound of abdominal wall, unspecified quadrant without penetration into peritoneal cavity, initial encounter: Secondary | ICD-10-CM | POA: Diagnosis not present

## 2016-07-02 DIAGNOSIS — G8921 Chronic pain due to trauma: Secondary | ICD-10-CM | POA: Diagnosis not present

## 2016-07-02 DIAGNOSIS — G8929 Other chronic pain: Secondary | ICD-10-CM | POA: Diagnosis present

## 2016-07-02 DIAGNOSIS — Z48815 Encounter for surgical aftercare following surgery on the digestive system: Secondary | ICD-10-CM | POA: Diagnosis not present

## 2016-07-02 DIAGNOSIS — R0602 Shortness of breath: Secondary | ICD-10-CM | POA: Diagnosis not present

## 2016-07-02 DIAGNOSIS — I739 Peripheral vascular disease, unspecified: Secondary | ICD-10-CM | POA: Diagnosis present

## 2016-07-02 DIAGNOSIS — R5381 Other malaise: Secondary | ICD-10-CM | POA: Diagnosis not present

## 2016-07-02 DIAGNOSIS — Z6841 Body Mass Index (BMI) 40.0 and over, adult: Secondary | ICD-10-CM | POA: Diagnosis not present

## 2016-07-02 DIAGNOSIS — R7989 Other specified abnormal findings of blood chemistry: Secondary | ICD-10-CM | POA: Diagnosis not present

## 2016-07-02 DIAGNOSIS — R1312 Dysphagia, oropharyngeal phase: Secondary | ICD-10-CM | POA: Diagnosis not present

## 2016-07-02 DIAGNOSIS — Z7951 Long term (current) use of inhaled steroids: Secondary | ICD-10-CM

## 2016-07-02 DIAGNOSIS — R0789 Other chest pain: Secondary | ICD-10-CM | POA: Diagnosis not present

## 2016-07-02 DIAGNOSIS — Z791 Long term (current) use of non-steroidal anti-inflammatories (NSAID): Secondary | ICD-10-CM

## 2016-07-02 DIAGNOSIS — K565 Intestinal adhesions [bands], unspecified as to partial versus complete obstruction: Secondary | ICD-10-CM | POA: Diagnosis present

## 2016-07-02 DIAGNOSIS — F419 Anxiety disorder, unspecified: Secondary | ICD-10-CM | POA: Diagnosis present

## 2016-07-02 DIAGNOSIS — M797 Fibromyalgia: Secondary | ICD-10-CM | POA: Diagnosis present

## 2016-07-02 DIAGNOSIS — R262 Difficulty in walking, not elsewhere classified: Secondary | ICD-10-CM

## 2016-07-02 DIAGNOSIS — D3502 Benign neoplasm of left adrenal gland: Secondary | ICD-10-CM | POA: Diagnosis present

## 2016-07-02 DIAGNOSIS — R278 Other lack of coordination: Secondary | ICD-10-CM | POA: Diagnosis not present

## 2016-07-02 DIAGNOSIS — K5649 Other impaction of intestine: Secondary | ICD-10-CM | POA: Diagnosis not present

## 2016-07-02 DIAGNOSIS — K5651 Intestinal adhesions [bands], with partial obstruction: Secondary | ICD-10-CM | POA: Diagnosis not present

## 2016-07-02 DIAGNOSIS — M6281 Muscle weakness (generalized): Secondary | ICD-10-CM | POA: Diagnosis not present

## 2016-07-02 DIAGNOSIS — F329 Major depressive disorder, single episode, unspecified: Secondary | ICD-10-CM | POA: Diagnosis present

## 2016-07-02 DIAGNOSIS — I10 Essential (primary) hypertension: Secondary | ICD-10-CM | POA: Diagnosis present

## 2016-07-02 DIAGNOSIS — K5652 Intestinal adhesions [bands] with complete obstruction: Secondary | ICD-10-CM | POA: Diagnosis not present

## 2016-07-02 DIAGNOSIS — R2681 Unsteadiness on feet: Secondary | ICD-10-CM | POA: Diagnosis not present

## 2016-07-02 DIAGNOSIS — Z86718 Personal history of other venous thrombosis and embolism: Secondary | ICD-10-CM

## 2016-07-02 DIAGNOSIS — Z4682 Encounter for fitting and adjustment of non-vascular catheter: Secondary | ICD-10-CM | POA: Diagnosis not present

## 2016-07-02 DIAGNOSIS — G629 Polyneuropathy, unspecified: Secondary | ICD-10-CM | POA: Diagnosis present

## 2016-07-02 DIAGNOSIS — K567 Ileus, unspecified: Secondary | ICD-10-CM | POA: Diagnosis not present

## 2016-07-02 DIAGNOSIS — R079 Chest pain, unspecified: Secondary | ICD-10-CM | POA: Diagnosis not present

## 2016-07-02 DIAGNOSIS — J309 Allergic rhinitis, unspecified: Secondary | ICD-10-CM | POA: Diagnosis present

## 2016-07-02 DIAGNOSIS — R000 Tachycardia, unspecified: Secondary | ICD-10-CM | POA: Diagnosis not present

## 2016-07-02 DIAGNOSIS — R109 Unspecified abdominal pain: Secondary | ICD-10-CM

## 2016-07-02 DIAGNOSIS — K210 Gastro-esophageal reflux disease with esophagitis: Secondary | ICD-10-CM | POA: Diagnosis not present

## 2016-07-02 DIAGNOSIS — Z87891 Personal history of nicotine dependence: Secondary | ICD-10-CM

## 2016-07-02 DIAGNOSIS — K560 Paralytic ileus: Secondary | ICD-10-CM | POA: Diagnosis not present

## 2016-07-02 DIAGNOSIS — K8500 Idiopathic acute pancreatitis without necrosis or infection: Secondary | ICD-10-CM | POA: Diagnosis not present

## 2016-07-02 DIAGNOSIS — E876 Hypokalemia: Secondary | ICD-10-CM | POA: Diagnosis not present

## 2016-07-02 DIAGNOSIS — G894 Chronic pain syndrome: Secondary | ICD-10-CM | POA: Diagnosis present

## 2016-07-02 DIAGNOSIS — K56699 Other intestinal obstruction unspecified as to partial versus complete obstruction: Secondary | ICD-10-CM | POA: Diagnosis not present

## 2016-07-02 DIAGNOSIS — R1010 Upper abdominal pain, unspecified: Secondary | ICD-10-CM | POA: Diagnosis not present

## 2016-07-02 DIAGNOSIS — K2970 Gastritis, unspecified, without bleeding: Secondary | ICD-10-CM | POA: Diagnosis not present

## 2016-07-02 DIAGNOSIS — M549 Dorsalgia, unspecified: Secondary | ICD-10-CM | POA: Diagnosis not present

## 2016-07-02 HISTORY — DX: Unspecified intestinal obstruction, unspecified as to partial versus complete obstruction: K56.609

## 2016-07-02 LAB — COMPREHENSIVE METABOLIC PANEL
ALT: 149 U/L — ABNORMAL HIGH (ref 14–54)
AST: 210 U/L — ABNORMAL HIGH (ref 15–41)
Albumin: 4.1 g/dL (ref 3.5–5.0)
Alkaline Phosphatase: 113 U/L (ref 38–126)
Anion gap: 14 (ref 5–15)
BUN: 23 mg/dL — ABNORMAL HIGH (ref 6–20)
CO2: 20 mmol/L — ABNORMAL LOW (ref 22–32)
Calcium: 9.9 mg/dL (ref 8.9–10.3)
Chloride: 100 mmol/L — ABNORMAL LOW (ref 101–111)
Creatinine, Ser: 0.87 mg/dL (ref 0.44–1.00)
GFR calc Af Amer: >60 mL/min (ref >60)
GFR calc non Af Amer: >60 mL/min (ref >60)
Glucose, Bld: 160 mg/dL — ABNORMAL HIGH (ref 65–99)
Potassium: 3.7 mmol/L (ref 3.5–5.1)
Sodium: 134 mmol/L — ABNORMAL LOW (ref 135–145)
Total Bilirubin: 1.8 mg/dL — ABNORMAL HIGH (ref 0.3–1.2)
Total Protein: 8.3 g/dL — ABNORMAL HIGH (ref 6.5–8.1)

## 2016-07-02 LAB — CBC WITH DIFFERENTIAL/PLATELET
Band Neutrophils: 0 %
Basophils Absolute: 0 10*3/uL (ref 0.0–0.1)
Basophils Relative: 0 %
Blasts: 0 %
Eosinophils Absolute: 0 10*3/uL (ref 0.0–0.7)
Eosinophils Relative: 0 %
HCT: 45.4 % (ref 36.0–46.0)
Hemoglobin: 15.4 g/dL — ABNORMAL HIGH (ref 12.0–15.0)
Lymphocytes Relative: 9 %
Lymphs Abs: 0.9 10*3/uL (ref 0.7–4.0)
MCH: 31 pg (ref 26.0–34.0)
MCHC: 33.9 g/dL (ref 30.0–36.0)
MCV: 91.3 fL (ref 78.0–100.0)
Metamyelocytes Relative: 0 %
Monocytes Absolute: 1 10*3/uL (ref 0.1–1.0)
Monocytes Relative: 10 %
Myelocytes: 0 %
Neutro Abs: 8.2 10*3/uL — ABNORMAL HIGH (ref 1.7–7.7)
Neutrophils Relative %: 81 %
Other: 0 %
Platelets: 323 10*3/uL (ref 150–400)
Promyelocytes Absolute: 0 %
RBC: 4.97 MIL/uL (ref 3.87–5.11)
RDW: 14.9 % (ref 11.5–15.5)
WBC: 10.1 10*3/uL (ref 4.0–10.5)
nRBC: 0 /100 WBC

## 2016-07-02 LAB — 25-HYDROXY VITAMIN D LCMS D2+D3
25-Hydroxy, Vitamin D-2: <1 ng/mL
25-Hydroxy, Vitamin D-3: 29 ng/mL
25-Hydroxy, Vitamin D: 30 ng/mL

## 2016-07-02 LAB — I-STAT TROPONIN, ED: Troponin i, poc: 0.05 ng/mL (ref 0.00–0.08)

## 2016-07-02 LAB — LACTATE DEHYDROGENASE: LDH: 275 U/L — ABNORMAL HIGH (ref 98–192)

## 2016-07-02 LAB — LIPASE, BLOOD: Lipase: 3627 U/L — ABNORMAL HIGH (ref 11–51)

## 2016-07-02 IMAGING — CR DG ABDOMEN 2V
1 series · 3 of 3 positions shown · non-contrast
Comparison: None.

CLINICAL DATA: Abdominal pain with nausea and vomiting for several
weeks.

EXAM:
ABDOMEN - 2 VIEW

[Series 1: w abdomen upright · 0.14mm/px · 3 of 3 slices shown]
[im 1/3]
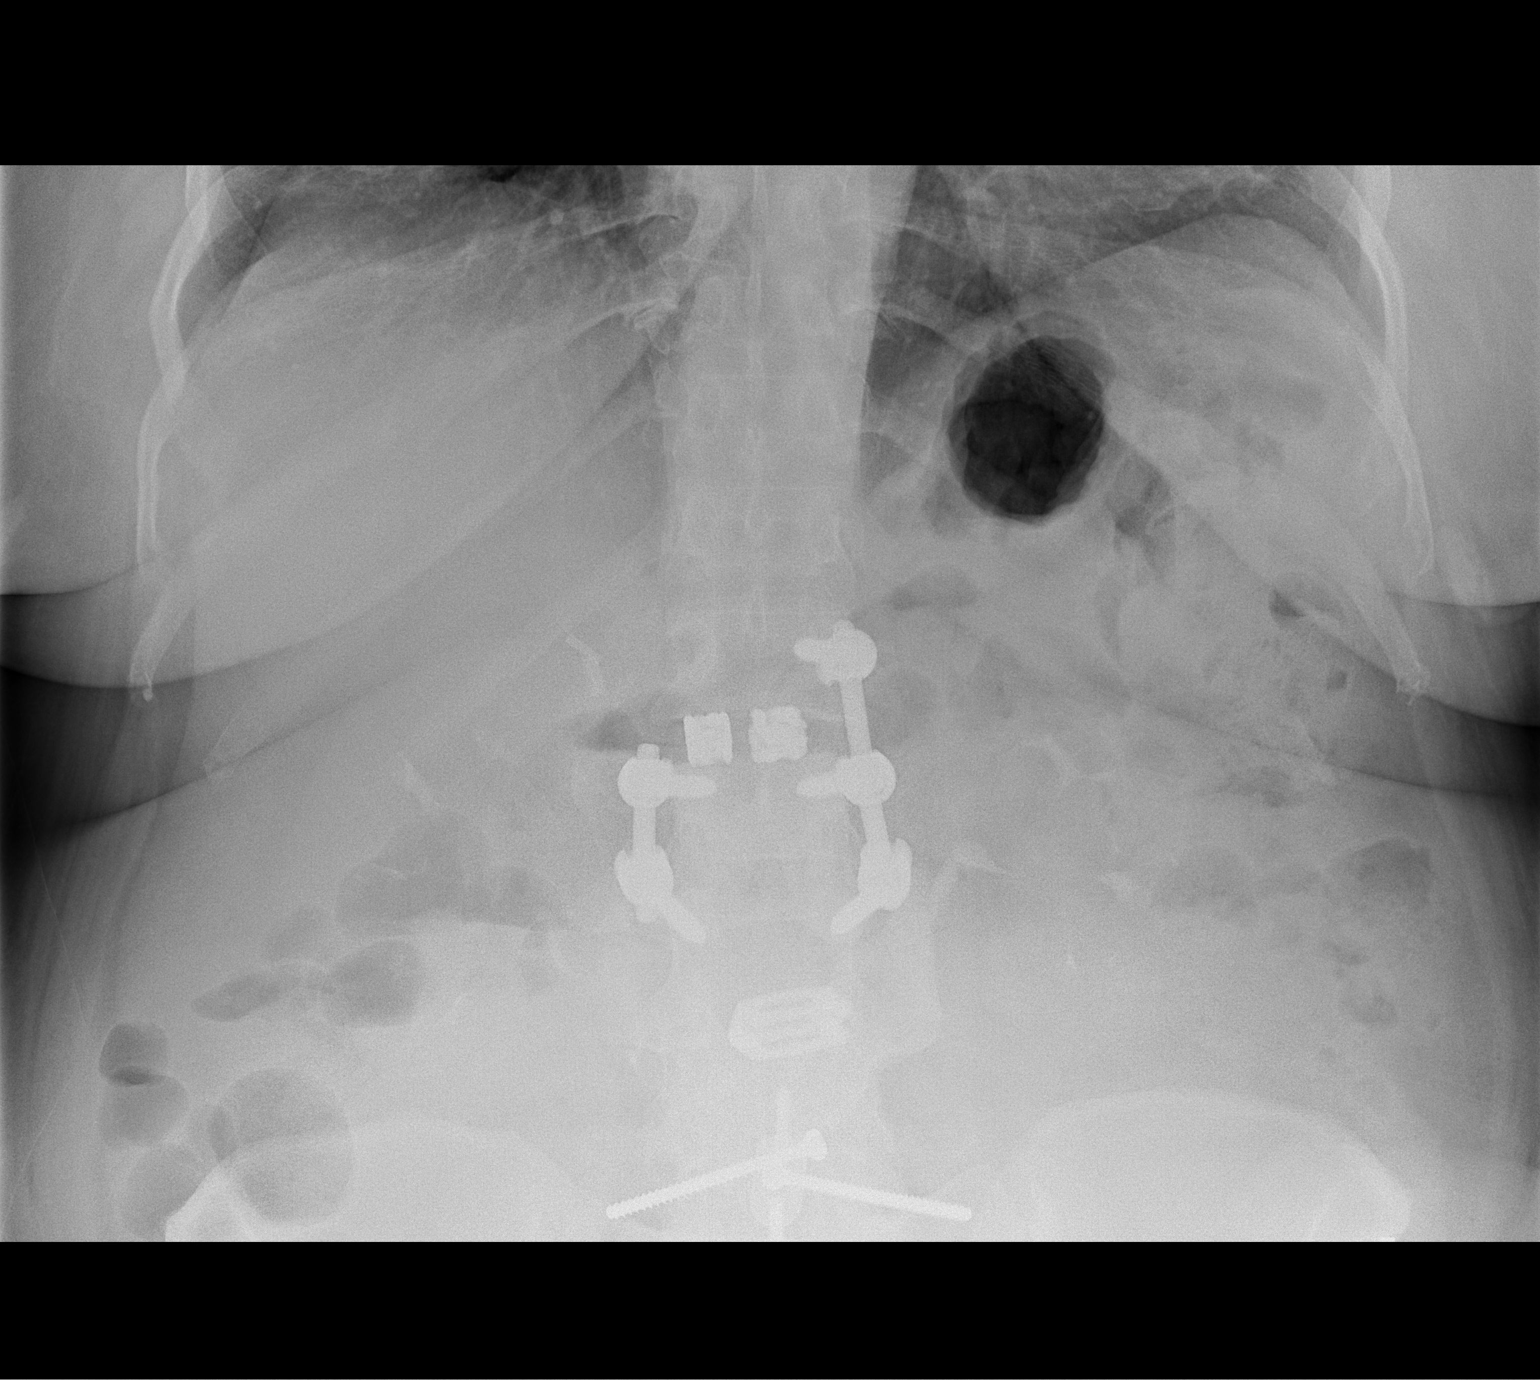
[im 2/3]
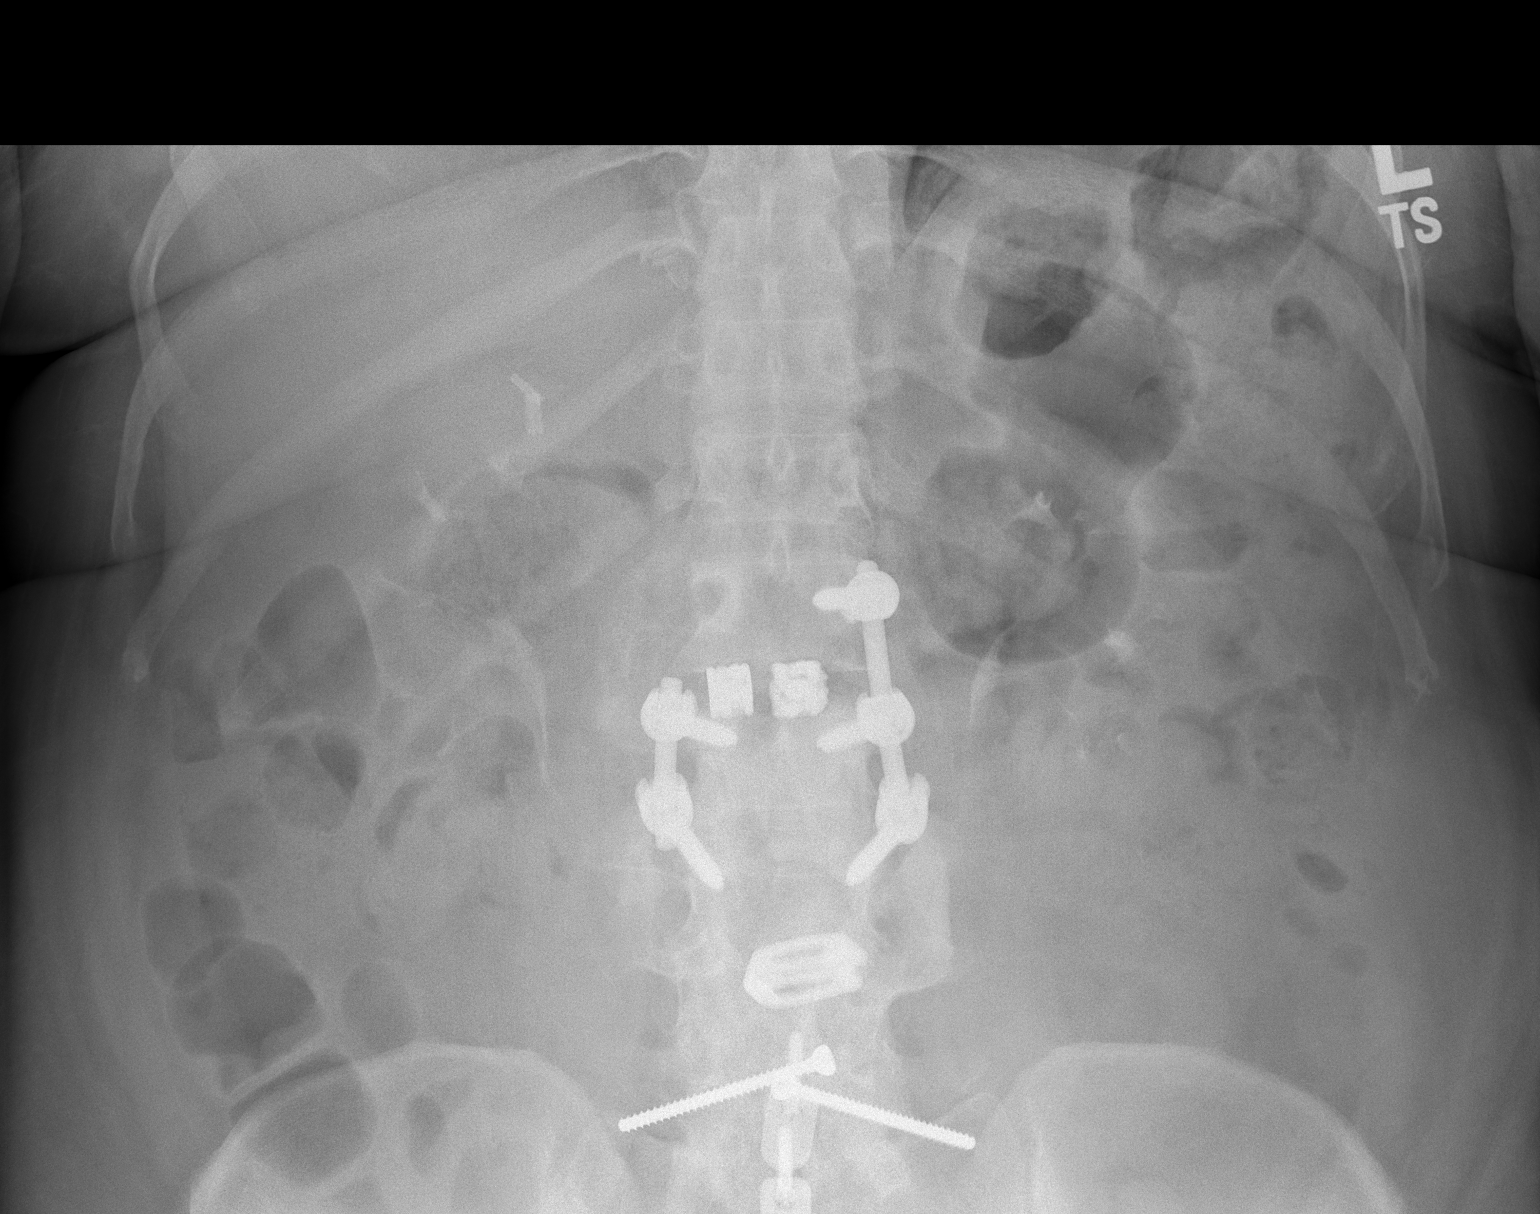
[im 3/3]
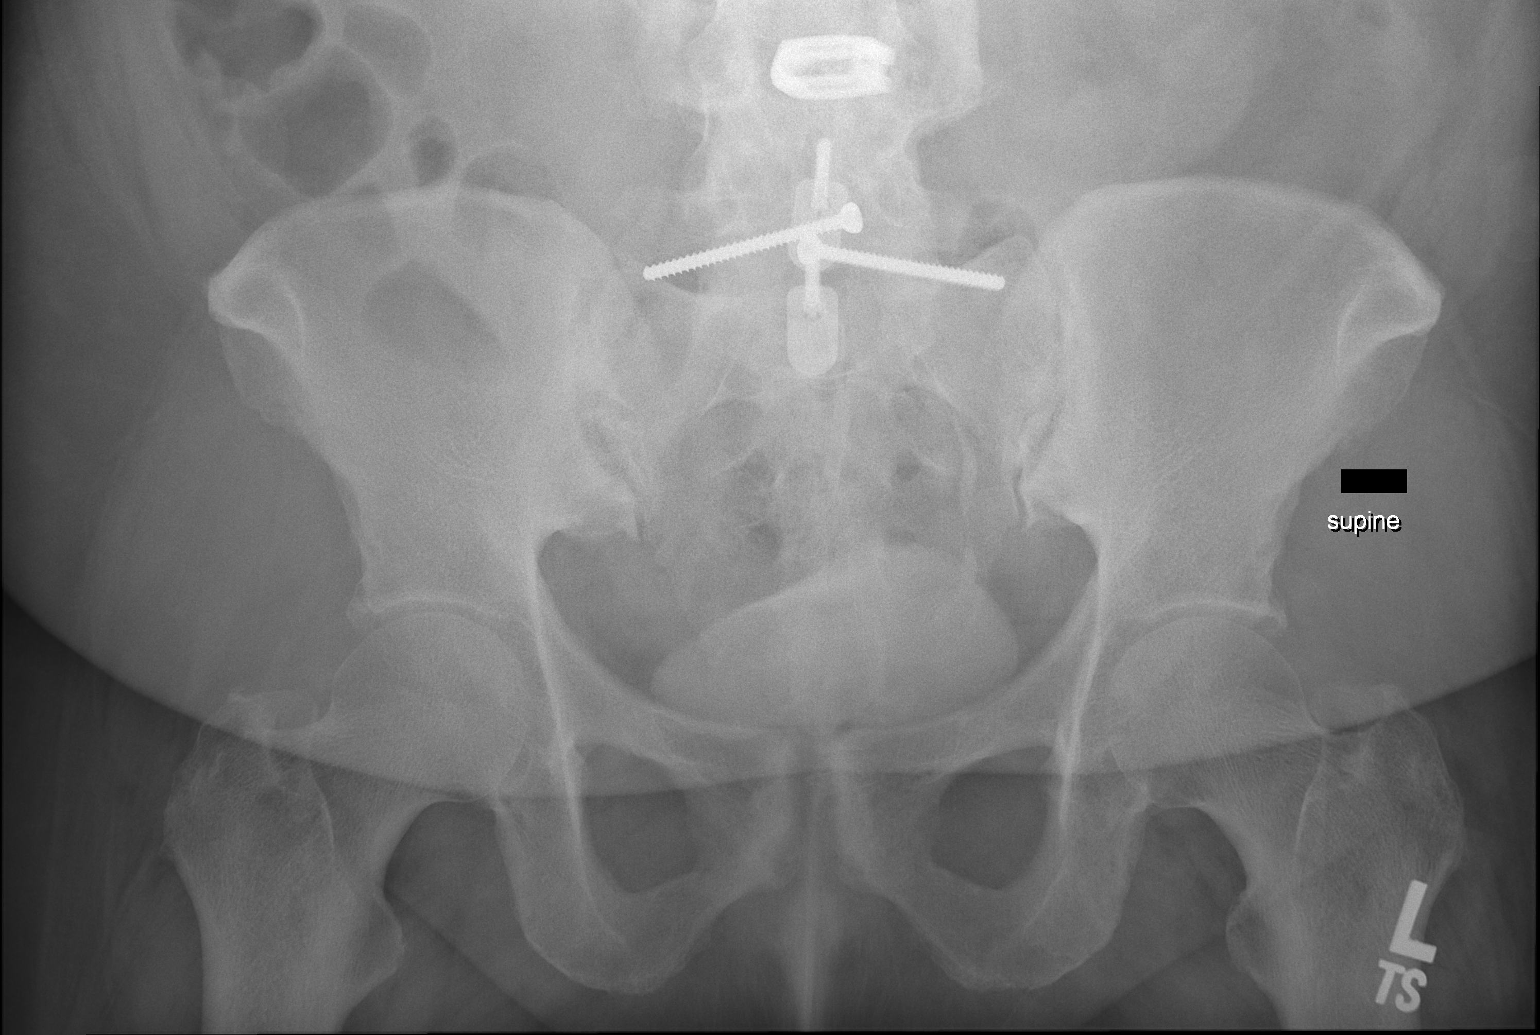

[3 of 3 positions shown; findings below may reference images not displayed]

FINDINGS: No evidence of dilated bowel loops or free air. Residual contrast
seen within the urinary tract from recent CT. Lumbar spine fusion
hardware again noted.
IMPRESSION: Normal bowel gas pattern.  No acute findings.

## 2016-07-02 IMAGING — CT CT CTA ABD/PEL W/CM AND/OR W/O CM
3 of 9 series · 11 of 46 positions shown, 17 images · IV contrast (APPLIED)
Comparison: CT dated 01/28/2016

CLINICAL DATA: 70-year-old female with continued pelvic and lower
abdominal pain. Patient was seen in the ER 2 days ago with diagnosis
of bladder infection. Clinical concern for aortic dissection.

EXAM:
CTA ABDOMEN AND PELVIS wITHOUT AND WITH CONTRAST
TECHNIQUE: Multidetector CT imaging of the abdomen and pelvis was performed
using the standard protocol during bolus administration of
intravenous contrast. Multiplanar reconstructed images and MIPs were
obtained and reviewed to evaluate the vascular anatomy.
CONTRAST:  100 cc Isovue 370

[Series 4: arterial · axial · arterial · 0.79mm/px · z∈[-398,-304]mm · 3 of 235 slices shown]
[im 24/235  soft-tissue]
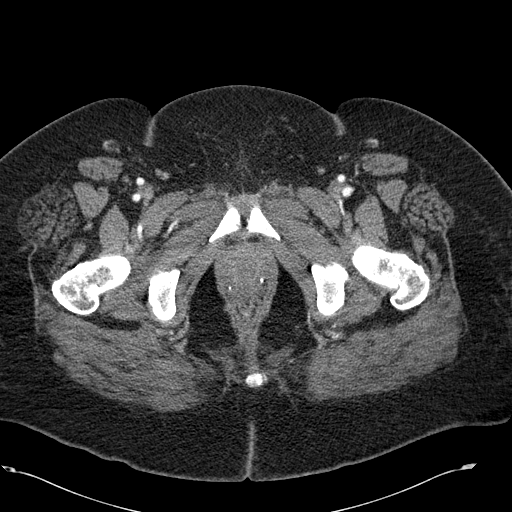
[im 47/235  soft-tissue]
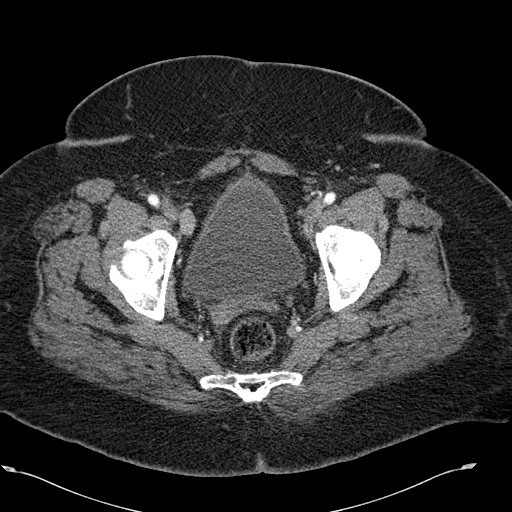
[im 71/235  soft-tissue]
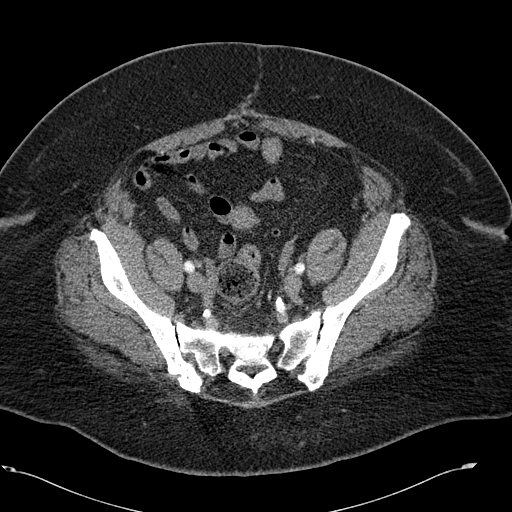

[Series 6: venous · axial · portal-venous · 0.79mm/px · z∈[-376,-46]mm · 6 of 94 slices shown, 11 images]
[im 14/94  soft-tissue]
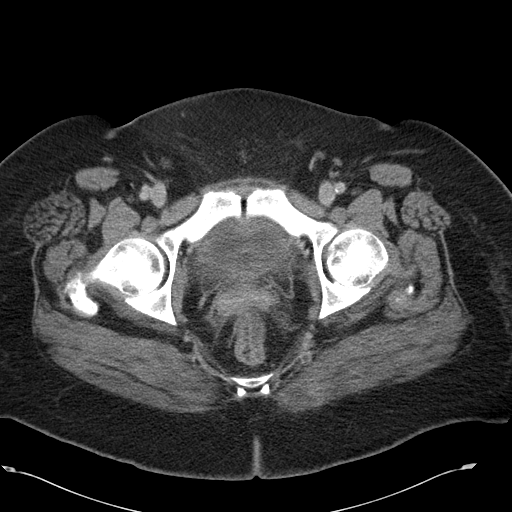
[im 14/94  bone]
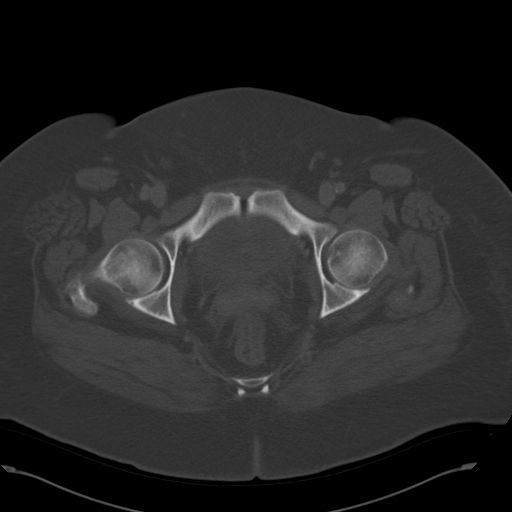
[im 27/94  soft-tissue]
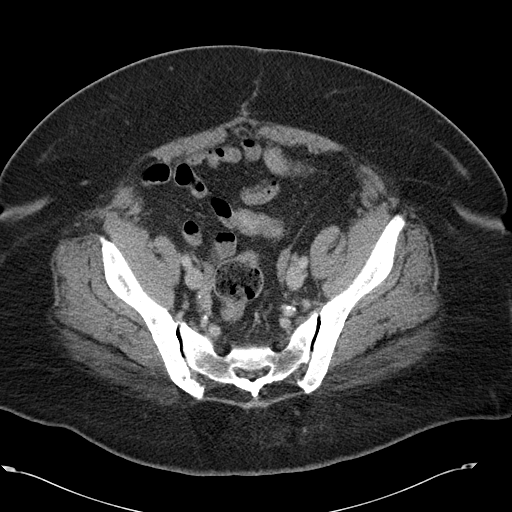
[im 40/94  soft-tissue]
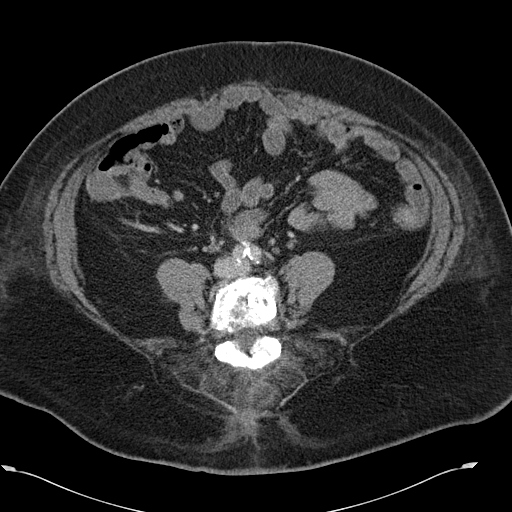
[im 40/94  lung]
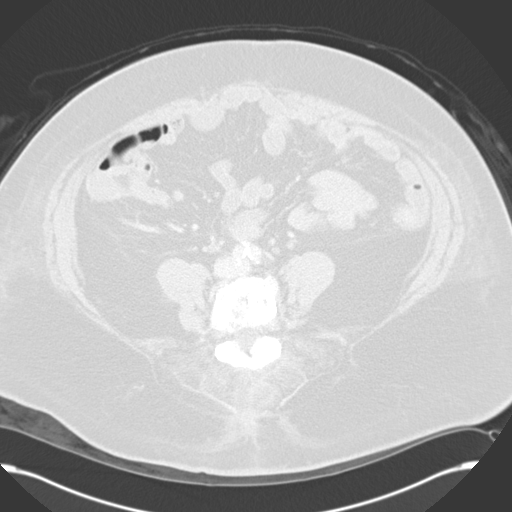
[im 54/94  soft-tissue]
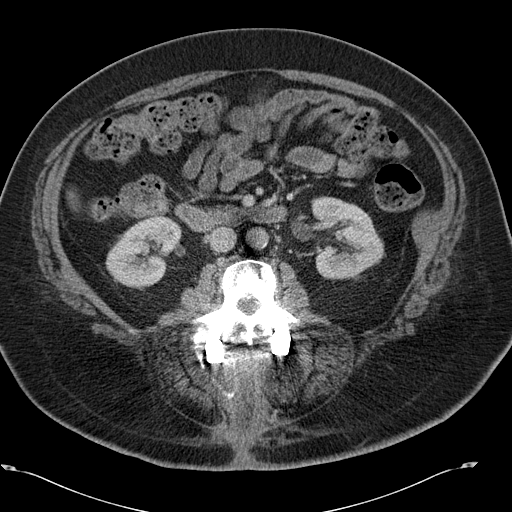
[im 54/94  lung]
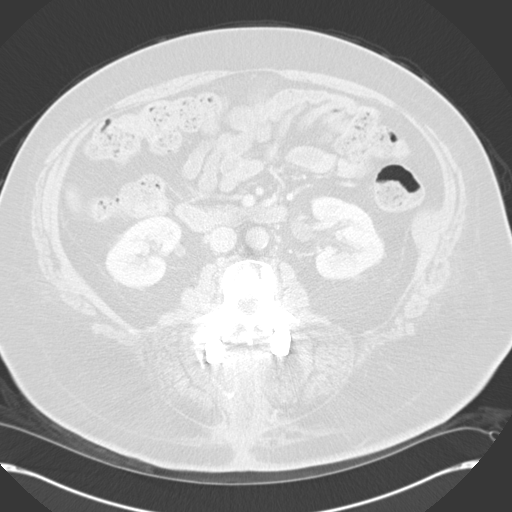
[im 67/94  soft-tissue]
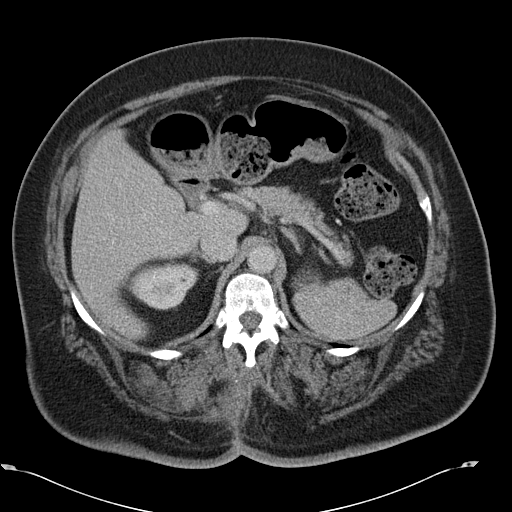
[im 67/94  lung]
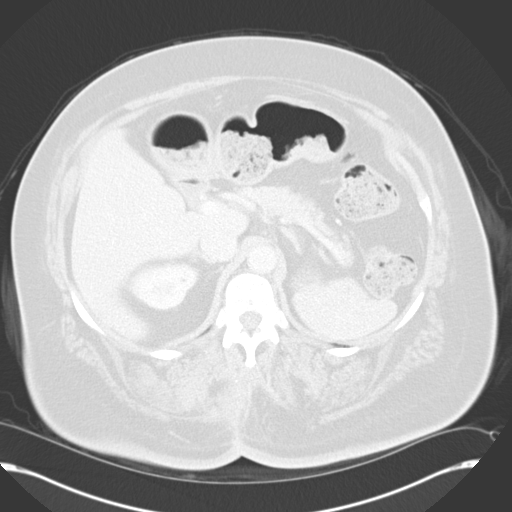
[im 80/94  soft-tissue]
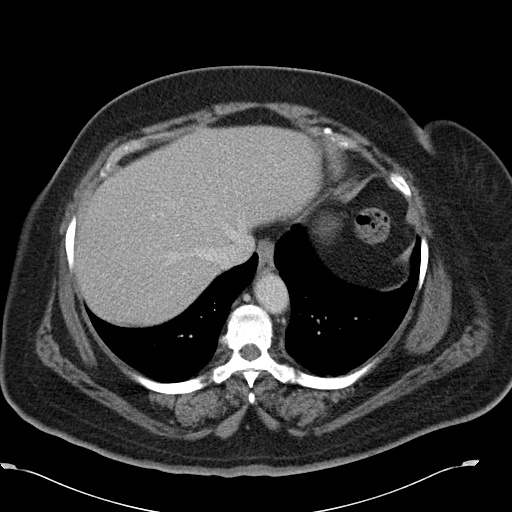
[im 80/94  lung]
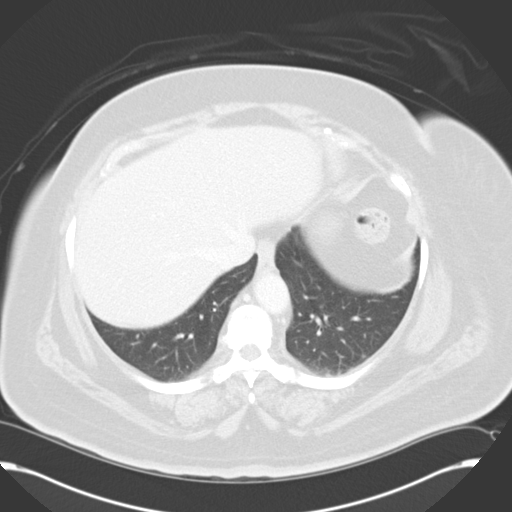

[Series 8: cor arterial mpr · coronal · arterial · 0.74mm/px · 2 of 176 slices shown, 3 images]
[im 59/176  soft-tissue]
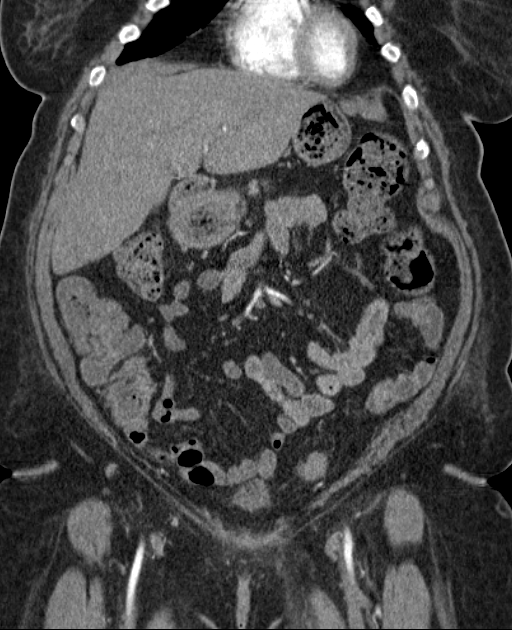
[im 59/176  bone]
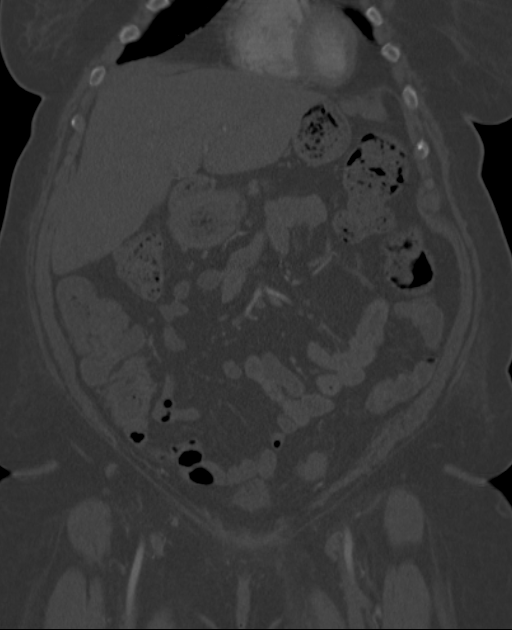
[im 117/176  soft-tissue]
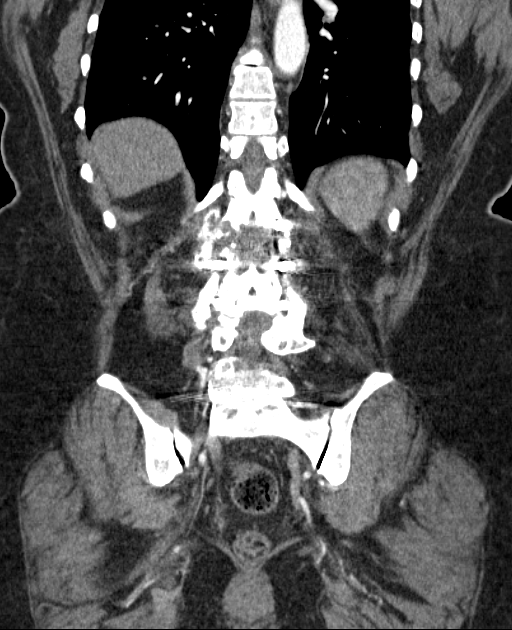

[11 of 46 positions shown; findings below may reference images not displayed]

FINDINGS: Minimal left lung base atelectatic changes. Focal area of linear
scarring noted in the lingula. There is a 7 mm ground-glass nodular
density in the right lower lobe (series 4 image 15). Adenocarcinoma
cannot be excluded. Follow up by CT is recommended in 12 months,
with continued annual surveillance for a minimum of 3 years.

No intra-abdominal free air or free fluid.

Cholecystectomy. The liver, pancreas, spleen, and the right adrenal
gland appear unremarkable. There is a 1.5 cm indeterminate left
adrenal nodule, possibly an adenoma. The kidneys, visualized
ureters, and urinary bladder appear unremarkable. The hysterectomy.

Moderate stool noted throughout the colon. There is no evidence of
bowel obstruction or active inflammation. The appendix is not
visualized with certainty. No inflammatory changes identified in the
right lower quadrant.

There is moderate aortoiliac atherosclerotic disease. There is no
evidence of aortic dissection or aneurysm. The origins of the celiac
axis, SMA, IMA as well as the origins of the renal arteries appear
patent. The IVC and the portal vein appear unremarkable. No portal
venous gas identified. There is no adenopathy.

Midline vertical anterior pelvic wall incisional scar noted. There
is multilevel postsurgical changes of the lumbar spine including
intervertebral cage at L1-L2 and L3-L4. L4-L5 new L5-S1 anterior
fixation plate and screws caps noted. The left pedicular screw noted
at L1. There is laminectomy with bilateral posterior fixation screws
at L2 and L3.

There is postsurgical changes and inflammation of the paraspinal
soft tissue and musculature. There is a 2.1 x 6.9 cm complex
appearing fluid collection at the operative bed extending from the
posterior elements to the subcutaneous soft tissues of the lower
back at the level of L1-L3. This likely represents a postsurgical
seroma. An infected fluid or developing abscess is not excluded.
Clinical correlation is recommended. No acute fracture identified.

Review of the MIP images confirms the above findings.
IMPRESSION: No acute intra-abdominal pelvic pathology. Specifically there is no
evidence of aortic dissection or aneurysm.

A 7 mm ground-glass nodular density at the right lung base.
Adenocarcinoma cannot be excluded. Follow up by CT is recommended in
12 months, with continued annual surveillance for a minimum of 3
years.These recommendations are taken from:Recommendations for the
Management of Subsolid Pulmonary Nodules Detected at CT: A Statement
from [REDACTED] 8022; [DATE], 304-317.

Postsurgical changes of lumbar spine as described with a small fluid
collection Set in the surgical bed, likely postoperative seroma.

## 2016-07-02 MED ORDER — ONDANSETRON HCL 4 MG/2ML IJ SOLN
4.0000 mg | Freq: Once | INTRAMUSCULAR | Status: AC
  Administered 2016-07-02: 4 mg via INTRAVENOUS
  Filled 2016-07-02: qty 2

## 2016-07-02 MED ORDER — IOPAMIDOL (ISOVUE-300) INJECTION 61%
INTRAVENOUS | Status: AC
  Administered 2016-07-02: 100 mL
  Filled 2016-07-02: qty 100

## 2016-07-02 MED ORDER — MORPHINE SULFATE (PF) 2 MG/ML IV SOLN
2.0000 mg | Freq: Once | INTRAVENOUS | Status: AC
  Administered 2016-07-02: 2 mg via INTRAVENOUS
  Filled 2016-07-02: qty 1

## 2016-07-02 MED ORDER — SODIUM CHLORIDE 0.9 % IV BOLUS (SEPSIS)
500.0000 mL | Freq: Once | INTRAVENOUS | Status: AC
  Administered 2016-07-02: 500 mL via INTRAVENOUS

## 2016-07-02 MED ORDER — MORPHINE SULFATE (PF) 4 MG/ML IV SOLN: 4.0000 mg | Freq: Once | INTRAVENOUS | Status: DC

## 2016-07-02 MED ORDER — MORPHINE SULFATE (PF) 4 MG/ML IV SOLN
4.0000 mg | Freq: Once | INTRAVENOUS | Status: AC
  Administered 2016-07-02: 4 mg via INTRAVENOUS
  Filled 2016-07-02: qty 1

## 2016-07-02 MED ORDER — SODIUM CHLORIDE 0.9 % IV SOLN
Freq: Once | INTRAVENOUS | Status: AC
  Administered 2016-07-02: 19:00:00 via INTRAVENOUS

## 2016-07-02 NOTE — ED Provider Notes (Signed)
[name] DEPT Provider Note   CSN: [id number] Arrival date & time: 07/02/16  1442     History   Chief Complaint Chief Complaint  Patient presents with  . Chest Pain    HPI Amy [name] is a 70 y.o. female.  HPI Patient with history of hiatal hernia and presents with central chest pressure for the past 5 days. States the pressure has been episodic. She then developed nausea and vomiting starting last evening. She's had multiple episodes of vomiting. Unable to tolerate any fluids or solids. She has had several loose bowel movements. No blood in either vomit or stool. States she feels that her abdomen is distended. Complains of diffuse abdominal pain. Has had a history of multiple previous abdominal surgeries and has chronic abdominal pain. Given 4 baby aspirin and nitroglycerin by EMS on scene. No change in symptoms. Vital signs stable in route. Past Medical History:  Diagnosis Date  . Abdominal pain of unknown etiology 02/05/2016  . Anxiety   . Arthritis    ddd- RA  . Asthma    "sleeping asthma"  . Chronic back pain    lumbar steroid injection's  . Constipation   . Depression   . Dysphonia    intermittent "voice changes"  . Fibromyalgia   . GERD (gastroesophageal reflux disease)   . Glaucoma   . Headache   . History of DVT (deep vein thrombosis) 1989   LEFT UPPER ARM  . History of hiatal hernia   . History of MRSA infection 2011  . Hypertension   . Irregular heart rate    "years ago"  . Low iron   . Mild obstructive sleep apnea    per study 02-07-2006 - no cpap  . Neuropathy (HCC)    feet  . Pelvic pain in female   . Peripheral vascular disease ([place])    "poor circulation"  . SBO (small bowel obstruction) 06/2016  . Seasonal allergies   . Short of breath on exertion   . Urinary frequency   . Weakness    both hands and feet    Patient Active Problem List   Diagnosis Date Noted  . Pancreatitis 07/02/2016  . SBO (small bowel obstruction)  07/02/2016  . Long term current use of opiate analgesic 06/26/2016  . Long term prescription opiate use 06/26/2016  . Opiate use 06/26/2016  . Encounter for therapeutic drug level monitoring 06/26/2016  . Encounter for pain management planning 06/26/2016  . Chronic pain 06/26/2016  . Disturbance of skin sensation 06/26/2016  . Chronic low back pain (Location of Primary Source of Pain) (Bilateral) (R>L) 06/26/2016  . Chronic hip pain (Location of Secondary source of pain) (Bilateral) (R>L) 06/26/2016  . 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 Chronic shoulder pain (Location of Tertiary source of pain) (Bilateral) (R>L) 06/26/2016  . 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 Chronic knee pain (Bilateral) (R>L) 06/26/2016  . 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 Chronic neck pain (Right) 06/26/2016  . Lumbar spondylosis 06/26/2016  . Lumbar facet syndrome 06/26/2016  . Lumbar facet hypertrophy 06/26/2016  . 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 Neurogenic pain 06/26/2016  . Occipital headaches 06/26/2016  . 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 History of lumbar fusion 06/26/2016  . 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  . Neuropathic pain of both legs 10/13/2015  . Constipation 10/13/2015  . Complete rotator cuff tear of left shoulder 05/05/2014  . Depression  08/06/2013  . Hypokalemia 08/06/2013  . GERD (gastroesophageal reflux disease) 08/06/2013  . Spinal stenosis of lumbar region 08/06/2013  . Bladder spasm 08/06/2013  . Obesity, Class III, BMI 40-49.9 (morbid obesity) ([place]) 02/03/2013  . Lipoma of buttock s/p excision 02/20/2013 02/03/2013  . DYSPNEA 01/23/2008  . OBSTRUCTIVE SLEEP APNEA 09/12/2007  . Essential hypertension 09/12/2007  . Allergic rhinitis 09/12/2007  . SLEEPINESS 09/12/2007  . ANGINA, HX OF 09/12/2007    Past Surgical History:  Procedure Laterality Date  .  abdominal adhesions removed    . ABDOMINAL HYSTERECTOMY  1978  . BUNIONECTOMY Left 2008  . COLONOSCOPY    . CYSTO WITH HYDRODISTENSION N/A 07/12/2015   Procedure: CYSTOSCOPY/HYDRODISTENSION;  Surgeon: Bjorn Loser, MD;  Location: [facility];  Service: Urology;  Laterality: N/A;  . ESOPHAGEAL MANOMETRY N/A 11/22/2014   Procedure: ESOPHAGEAL MANOMETRY (EM);  Surgeon: Winfield Cunas., MD;  Location: [facility];  Service: Endoscopy;  Laterality: N/A;  . ESOPHAGOGASTRODUODENOSCOPY N/A 07/15/2013   Procedure: ESOPHAGOGASTRODUODENOSCOPY (EGD);  Surgeon: Winfield Cunas., MD;  Location: [facility];  Service: Endoscopy;  Laterality: N/A;  need xray  . EYE SURGERY Bilateral    cataract surgery with lens implants  . HARDWARE REMOVAL N/A 01/02/2016   Procedure: Exploration of Lumbar Fusion,Removal of hardware Lumbar One-Two ;Redo Posterior Lumbar Fusion Lumbar One-Two;  Surgeon: Kary Kos, MD;  Location: [facility];  Service: Neurosurgery;  Laterality: N/A;  . LAPAROSCOPIC CHOLECYSTECTOMY  01-11-2006  . LUMBAR FUSION  2014   L4 -- L5  . MASS EXCISION Left 02/20/2013   Procedure: EXCISION LEFT BUTTOCK  MASS;  Surgeon: Adin Hector, MD;  Location: [facility];  Service: General;  Laterality: Left;  . NOSE SURGERY  2007  . REMOVAL HARDWARE L4-L5/  BILATERAL LAMINECTOMY L2 - L5 AND FUSION  06-12-2011  . SHOULDER OPEN ROTATOR CUFF REPAIR Left 05/05/2014   Procedure: OPEN ACROMIONECTOMY AND OPEN REPAIR OF ROTATOR CUFF, TISSUEMEND GRAFT WITH ANCHOR ;  Surgeon: Tobi Bastos, MD;  Location: [facility];  Service: Orthopedics;  Laterality: Left;  . SPINE SURGERY    . TONSILLECTOMY      OB History    No data available       Home Medications    Prior to Admission medications   Medication Sig Start Date End Date Taking? Authorizing Provider  acetaminophen (TYLENOL) 500 MG tablet Take 1,000-1,500 mg by mouth 4 (four) times daily as needed (pain).   Yes Historical Provider, MD  B  Complex Vitamins (VITAMIN B COMPLEX PO) Take 1 tablet by mouth daily.   Yes Historical Provider, MD  Calcium Carbonate-Vitamin D (CALCIUM-VITAMIN D3 PO) Take 1 tablet by mouth daily.   Yes Historical Provider, MD  cloNIDine (CATAPRES) 0.1 MG tablet Take 0.1 mg by mouth 3 (three) times daily.    Yes Historical Provider, MD  diphenhydrAMINE (BENADRYL) 25 mg capsule Take 25 mg by mouth 2 (two) times daily as needed for allergies.   Yes Historical Provider, MD  estradiol (ESTRACE) 0.1 MG/GM vaginal cream Place 1 Applicatorful vaginally 2 (two) times a week. Tuesdays and Thursdays   Yes Historical Provider, MD  fluticasone (FLONASE) 50 MCG/ACT nasal spray Place 1 spray into both nostrils daily.  06/14/16  Yes Historical Provider, MD  gabapentin (NEURONTIN) 300 MG capsule Take 300 mg by mouth 2 (two) times daily.   Yes Historical Provider, MD  glycopyrrolate (ROBINUL) 2 MG tablet Take 2 mg by mouth 2 (two) times daily.  12/30/12  Yes Historical Provider, MD  hydrALAZINE (APRESOLINE) 25 MG tablet Take 25 mg by mouth 3 (three) times daily.   Yes Historical Provider, MD  L-Methylfolate-Algae-B12-B6 [place]) 3-90.314-2-35 MG CAPS Take 1 capsule by mouth 2 (two) times daily.    Yes Historical Provider, MD  losartan-hydrochlorothiazide (HYZAAR) 100-25 MG per tablet Take 1 tablet by mouth daily. Reported on 01/31/2016 01/14/13  Yes Historical Provider, MD  meloxicam (MOBIC) 15 MG tablet Take 15 mg by mouth daily.  06/18/16  Yes Historical Provider, MD  Menthol, Topical Analgesic, (ICY HOT EX) Apply 1 patch topically daily as needed (pain). patches   Yes Historical Provider, MD  Menthol, Topical Analgesic, (ICY HOT EX) Apply 1 application topically 2 (two) times daily as needed (pain). Gel   Yes Historical Provider, MD  Multiple Vitamin (MULTIVITAMIN WITH MINERALS) TABS Take 1 tablet by mouth daily.   Yes Historical Provider, MD  NIFEdipine (PROCARDIA) 10 MG capsule Take 10 mg by mouth 3 (three) times daily. 01/27/16  Yes  Historical Provider, MD  pantoprazole (PROTONIX) 40 MG tablet Take 40 mg by mouth daily.    Yes Historical Provider, MD  PARoxetine (PAXIL) 40 MG tablet Take 40 mg by mouth daily. Reported on 01/18/2016 12/15/15  Yes Historical Provider, MD  polyethylene glycol (MIRALAX / GLYCOLAX) packet Take 17 g by mouth daily as needed (constipation).    Yes Historical Provider, MD  potassium chloride SA (K-DUR,KLOR-CON) 20 MEQ tablet Take 20 mEq by mouth daily.    Yes Historical Provider, MD  sennosides-docusate sodium (SENOKOT-S) 8.6-50 MG tablet Take 2 tablets by mouth at bedtime as needed for constipation.    Yes Historical Provider, MD  silodosin (RAPAFLO) 8 MG CAPS capsule Take 8 mg by mouth daily as needed (difficulty urinating).   Yes Historical Provider, MD  vitamin E (VITAMIN E) 400 UNIT capsule Take 400 Units by mouth daily.   Yes Historical Provider, MD    Family History Family History  Problem Relation Age of Onset  . Hypertension Mother   . Cancer Mother     cervix  . Kidney disease Mother   . Hypertension Sister   . Cancer Sister     polyps  . Kidney disease Brother   . Cancer Daughter     leukemia  . Hypertension Brother     Social History Social History  Substance Use Topics  . Smoking status: Former Smoker    Quit date: 07/10/1969  . Smokeless tobacco: Never Used  . Alcohol use Yes     Comment: rare     Allergies   Latex and Penicillins   Review of Systems Review of Systems  Constitutional: Negative for chills and fever.  Respiratory: Negative for cough and shortness of breath.   Cardiovascular: Positive for chest pain. Negative for palpitations and leg swelling.  Gastrointestinal: Positive for abdominal distention, abdominal pain, nausea and vomiting. Negative for blood in stool and constipation.  Genitourinary: Negative for dysuria, flank pain and frequency.  Musculoskeletal: Negative for back pain, myalgias, neck pain and neck stiffness.  Skin: Negative for rash  and wound.  Neurological: Negative for dizziness, light-headedness, numbness and headaches.  All other systems reviewed and are negative.    Physical Exam Updated Vital Signs BP (!) 141/74 (BP Location: Left Arm)   Pulse (!) 122   Temp 99.1 F (37.3 C) (Oral)   Resp 19   Ht 5\' 5"  (1.651 m)   Wt 273 lb (123.8 kg)   SpO2 97%   BMI 45.43  kg/m   Physical Exam  Constitutional: She is oriented to person, place, and time. She appears well-developed and well-nourished.  HENT:  Head: Normocephalic and atraumatic.  Dry mucous membranes  Eyes: EOM are normal. Pupils are equal, round, and reactive to light.  Neck: Normal range of motion. Neck supple. No JVD present.  Cardiovascular: Normal rate and regular rhythm.   Murmur heard. Systolic murmur  Pulmonary/Chest: Effort normal and breath sounds normal.  Abdominal: Soft. Bowel sounds are normal. She exhibits distension. There is tenderness (diffuse abdominal tenderness to palpation without rebound or guarding.). There is no rebound and no guarding.  Decreased bowel sounds throughout.  Musculoskeletal: Normal range of motion. She exhibits no edema or tenderness.  No lower extremity swelling, asymmetry or tenderness. 2+ distal pulses in all extremities.  Neurological: She is alert and oriented to person, place, and time.  Skin: Skin is warm and dry. Capillary refill takes less than 2 seconds. No rash noted. No erythema.  Psychiatric: She has a normal mood and affect. Her behavior is normal.  Nursing note and vitals reviewed.    ED Treatments / Results  Labs (all labs ordered are listed, but only abnormal results are displayed) Labs Reviewed  CBC WITH DIFFERENTIAL/PLATELET - Abnormal; Notable for the following:       Result Value   Hemoglobin 15.4 (*)    Neutro Abs 8.2 (*)    All other components within normal limits  COMPREHENSIVE METABOLIC PANEL - Abnormal; Notable for the following:    Sodium 134 (*)    Chloride 100 (*)    CO2  20 (*)    Glucose, Bld 160 (*)    BUN 23 (*)    Total Protein 8.3 (*)    AST 210 (*)    ALT 149 (*)    Total Bilirubin 1.8 (*)    All other components within normal limits  URINALYSIS, ROUTINE W REFLEX MICROSCOPIC (NOT AT [facility]) - Abnormal; Notable for the following:    Color, Urine AMBER (*)    Specific Gravity, Urine >1.046 (*)    Bilirubin Urine SMALL (*)    Protein, ur 100 (*)    All other components within normal limits  LIPASE, BLOOD - Abnormal; Notable for the following:    Lipase 3,627 (*)    All other components within normal limits  LACTATE DEHYDROGENASE - Abnormal; Notable for the following:    LDH 275 (*)    All other components within normal limits  COMPREHENSIVE METABOLIC PANEL - Abnormal; Notable for the following:    Glucose, Bld 138 (*)    BUN 21 (*)    AST 72 (*)    ALT 111 (*)    All other components within normal limits  LIPASE, BLOOD - Abnormal; Notable for the following:    Lipase 627 (*)    All other components within normal limits  URINE MICROSCOPIC-ADD ON - Abnormal; Notable for the following:    Squamous Epithelial / LPF 0-5 (*)    Bacteria, UA MANY (*)    All other components within normal limits  COMPREHENSIVE METABOLIC PANEL - Abnormal; Notable for the following:    Glucose, Bld 105 (*)    BUN 28 (*)    Calcium 8.5 (*)    Albumin 2.9 (*)    ALT 70 (*)    GFR calc non Af Amer 57 (*)    All other components within normal limits  CBC - Abnormal; Notable for the following:    WBC  3.5 (*)    RDW 16.0 (*)    All other components within normal limits  LIPASE, BLOOD - Abnormal; Notable for the following:    Lipase 61 (*)    All other components within normal limits  CBC - Abnormal; Notable for the following:    RDW 16.2 (*)    All other components within normal limits  COMPREHENSIVE METABOLIC PANEL - Abnormal; Notable for the following:    Chloride 114 (*)    CO2 21 (*)    BUN 27 (*)    Calcium 8.6 (*)    Albumin 2.8 (*)    All other  components within normal limits  CBC - Abnormal; Notable for the following:    RDW 15.9 (*)    All other components within normal limits  COMPREHENSIVE METABOLIC PANEL - Abnormal; Notable for the following:    Chloride 114 (*)    CO2 21 (*)    Calcium 8.7 (*)    Albumin 2.8 (*)    All other components within normal limits  TROPONIN I - Abnormal; Notable for the following:    Troponin I 0.03 (*)    All other components within normal limits  CBC - Abnormal; Notable for the following:    RDW 16.0 (*)    All other components within normal limits  COMPREHENSIVE METABOLIC PANEL - Abnormal; Notable for the following:    Sodium 146 (*)    Chloride 115 (*)    Glucose, Bld 137 (*)    Calcium 8.3 (*)    Total Protein 5.9 (*)    Albumin 2.2 (*)    Total Bilirubin 1.3 (*)    GFR calc non Af Amer 57 (*)    All other components within normal limits  PROTIME-INR  APTT  CBC WITH DIFFERENTIAL/PLATELET  LIPID PANEL  TSH  TROPONIN I  TROPONIN I  D-DIMER, QUANTITATIVE (NOT AT [facility])  I-STAT TROPOININ, ED    EKG  EKG Interpretation  Date/Time:  Monday July 02 2016 15:08:16 EDT Ventricular Rate:  94 PR Interval:    QRS Duration: 87 QT Interval:  380 QTC Calculation: 476 R Axis:   7 Text Interpretation:  Sinus rhythm Confirmed by Lita Mains  MD, DAVID ([id number]) on 07/02/2016 3:31:14 PM       Radiology Ct Abdomen Pelvis W Contrast  Result Date: 07/05/2016 CLINICAL DATA:  69 year old female with recently diagnosed small bowel obstruction and pancreatitis. Continued abdominal pelvic pain with nausea. EXAM: CT ABDOMEN AND PELVIS WITH CONTRAST TECHNIQUE: Multidetector CT imaging of the abdomen and pelvis was performed using the standard protocol following bolus administration of intravenous contrast. CONTRAST:  100 cc intravenous Isovue 300 COMPARISON:  07/02/2016 and prior CTs. FINDINGS: Lower chest: Increased moderate bibasilar atelectasis identified. Hepatobiliary: The liver is  unremarkable. The patient is status post cholecystectomy. There is no evidence of biliary dilatation. Pancreas: Unremarkable Spleen: Unremarkable Adrenals/Urinary Tract: The kidneys, adrenal glands and bladder are unremarkable except for unchanged 1.7 cm left adrenal adenoma. Stomach/Bowel: An NG tube is present with tip in the mid stomach. Dilated proximal-mid small bowel loops are again identified with collapsed distal small bowel loops, compatible with small bowel obstruction. There has been interval decreased caliber of stomach and dilated small bowel loops since the prior study. There is no evidence of definite bowel wall thickening or pneumoperitoneum. Vascular/Lymphatic: Abdominal aortic atherosclerotic calcifications noted without aneurysm. No enlarged lymph nodes identified. Reproductive: Patient is status post hysterectomy. No adnexal masses identified. Other: No free fluid or focal  collection/abscess. Musculoskeletal: Surgical changes within the lumbar spine again identified. No acute or suspicious abnormality. IMPRESSION: Small bowel obstruction again noted, with decompression of stomach and decreased caliber of dilated small bowel loops from NG tube within the stomach. No evidence of abscess, free fluid or pneumoperitoneum. Increased moderate bibasilar atelectasis. Abdominal aortic atherosclerosis. Electronically Signed   By: Margarette Canada M.D.   On: 07/05/2016 13:26    Procedures Procedures (including critical care time)  Medications Ordered in ED Medications  acetaminophen (TYLENOL) tablet 650 mg ( Oral MAR Unhold 07/06/16 1841)    Or  acetaminophen (TYLENOL) suppository 650 mg ( Rectal MAR Unhold 07/06/16 1841)  cloNIDine (CATAPRES - Dosed in mg/24 hr) patch 0.1 mg ( Transdermal MAR Unhold 07/06/16 1841)  famotidine (PEPCID) IVPB 20 mg premix (20 mg Intravenous Given 07/07/16 0908)  chlorhexidine (PERIDEX) 0.12 % solution 15 mL (15 mLs Mouth Rinse Given 07/07/16 0909)  MEDLINE mouth rinse  (15 mLs Mouth Rinse Given 07/07/16 1230)  menthol-cetylpyridinium (CEPACOL) lozenge 3 mg ( Oral MAR Unhold 07/06/16 1841)  naloxone (NARCAN) injection 0.4 mg (not administered)    And  sodium chloride flush (NS) 0.9 % injection 9 mL (not administered)  ondansetron (ZOFRAN) injection 4 mg (not administered)  diphenhydrAMINE (BENADRYL) injection 12.5 mg (not administered)    Or  diphenhydrAMINE (BENADRYL) 12.5 MG/5ML elixir 12.5 mg (not administered)  enoxaparin (LOVENOX) injection 60 mg (60 mg Subcutaneous Given 07/07/16 0813)  morphine (MORPHINE) 2 mg/mL PCA injection ( Intravenous Received 07/07/16 1230)  promethazine (PHENERGAN) injection 12.5 mg (not administered)  morphine (MORPHINE) 2 mg/mL PCA injection (not administered)  HYDROmorphone (DILAUDID) 1 MG/ML injection (not administered)  labetalol (NORMODYNE,TRANDATE) 5 MG/ML injection (not administered)  hydrALAZINE (APRESOLINE) injection 10 mg (not administered)  metoprolol (LOPRESSOR) injection 5 mg (not administered)  dextrose 5 % and 0.45 % NaCl with KCl 20 mEq/L infusion ( Intravenous New Bag/Given 07/07/16 1230)  iopamidol (ISOVUE-370) 76 % injection (not administered)  sodium chloride 0.9 % bolus 500 mL (0 mLs Intravenous Stopped 07/02/16 1750)  0.9 %  sodium chloride infusion ( Intravenous New Bag/Given 07/02/16 1923)  sodium chloride 0.9 % bolus 500 mL (0 mLs Intravenous Stopped 07/02/16 2156)  ondansetron (ZOFRAN) injection 4 mg (4 mg Intravenous Given 07/02/16 1917)  morphine 2 MG/ML injection 2 mg (2 mg Intravenous Given 07/02/16 1917)  iopamidol (ISOVUE-300) 61 % injection (100 mLs  Contrast Given 07/02/16 1958)  morphine 4 MG/ML injection 4 mg (4 mg Intravenous Given 07/02/16 2232)  ondansetron (ZOFRAN) injection 4 mg (4 mg Intravenous Given 07/02/16 2232)  diatrizoate meglumine-sodium (GASTROGRAFIN) 66-10 % solution 90 mL (90 mLs Per NG tube Given 07/03/16 0526)  sodium chloride 0.9 % bolus 500 mL (500 mLs Intravenous Given  07/03/16 1645)  bisacodyl (DULCOLAX) suppository 10 mg (10 mg Rectal Given 07/04/16 1657)  iopamidol (ISOVUE-300) 61 % injection 15 mL (15 mLs Oral Contrast Given 07/05/16 1153)  iopamidol (ISOVUE-300) 61 % injection (100 mLs  Contrast Given 07/05/16 1257)  ciprofloxacin (CIPRO) IVPB 400 mg ( Intravenous MAR Unhold 07/06/16 1841)     Initial Impression / Assessment and Plan / ED Course  I have reviewed the triage vital signs and the nursing notes.  Pertinent labs & imaging results that were available during my care of the patient were reviewed by me and considered in my medical decision making (see chart for details).  Clinical Course   Signed out to oncoming emergency physician pending CT abdomen/pelvis. Suspect patient will need to be admitted to the  hospital.   Final Clinical Impressions(s) / ED Diagnoses   Final diagnoses:  SBO (small bowel obstruction)  Acute pancreatitis without infection or necrosis, unspecified pancreatitis type    New Prescriptions Current Discharge Medication List       Julianne Rice, MD 07/07/16 1241

## 2016-07-02 NOTE — ED Provider Notes (Signed)
70 yo F with a chief complaint of epigastric pain and vomiting. Going on for the past for 5 days. Patient had a elevated lipase concerning for pancreatitis. She also had a small bowel obstruction. NG tube was placed discussed with Dr. Kieth Brightly, gen surgery, will admit to medicine.   The patients results and plan were reviewed and discussed.   Any x-rays performed were independently reviewed by myself.   Differential diagnosis were considered with the presenting HPI.  Medications  morphine 4 MG/ML injection 4 mg (not administered)  ondansetron (ZOFRAN) injection 4 mg (not administered)  sodium chloride 0.9 % bolus 500 mL (0 mLs Intravenous Stopped 07/02/16 1750)  0.9 %  sodium chloride infusion ( Intravenous New Bag/Given 07/02/16 1923)  sodium chloride 0.9 % bolus 500 mL (0 mLs Intravenous Stopped 07/02/16 2156)  ondansetron (ZOFRAN) injection 4 mg (4 mg Intravenous Given 07/02/16 1917)  morphine 2 MG/ML injection 2 mg (2 mg Intravenous Given 07/02/16 1917)  iopamidol (ISOVUE-300) 61 % injection (100 mLs  Contrast Given 07/02/16 1958)    Vitals:   07/02/16 1500 07/02/16 1640 07/02/16 1900  BP:  172/96 172/82  Pulse:  102 103  Resp:  16 26  SpO2:  95% 96%  Weight: 260 lb (117.9 kg)    Height: 5\' 5"  (1.651 m)      Final diagnoses:  SBO (small bowel obstruction)  Acute pancreatitis without infection or necrosis, unspecified pancreatitis type    Admission/ observation were discussed with the admitting physician, patient and/or family and they are comfortable with the plan.     Deno Etienne, DO 07/02/16 2221

## 2016-07-02 NOTE — H&P (Signed)
History and Physical    Amy [name] [id number] DOB: 09-22-1946 DOA: 07/02/2016  PCP: Blanchie Serve, MD   Patient coming from: Home  Chief Complaint: Abdominal pain, nausea, and vomiting  HPI: Amy [name] is a 70 y.o. woman with a history of HTN, GERD, DVT (she is not on anticoagulation currently), and chronic pain who presents to the ED for evaluation of abdominal pain x 5 five days.  She does not recall a specific trigger.  She is not sure that food has made it worse because she has had decreased PO intake.  Within the past 24 hours, she has developed nausea and vomiting.  No documented fever, but she has had chills and sweats.  She also reports diarrhea.  No recent antibiotics.  Her only new medications are Claritin and a nasal spray for seasonal allergies.  ED Course: CT of the abdomen and pelvis shows a distended, fluid-filled stomach as well as evidence of a mid-small bowel obstruction.  She also has a stable appearing left adrenal adenoma.  She has a lipase level of 3627, though her pancreas is normal in appearance on CT.  She has abnormal LFTs with AST 210, ALT 149, and T bili 1.8.  Of note, she has had her gallbladder removed.   NG tube placed in the ED for decompression.  NPO status.  She has received IV morphine and zofran.  Hospitalist asked to admit.  General surgery has been consulted.  Review of Systems: As per HPI otherwise 10 point review of systems negative.    Past Medical History:  Diagnosis Date  . Abdominal pain of unknown etiology 02/05/2016  . Anxiety   . Arthritis    ddd- RA  . Asthma    "sleeping asthma"  . Chronic back pain    lumbar steroid injection's  . Constipation   . Depression   . Dysphonia    intermittent "voice changes"  . Fibromyalgia   . GERD (gastroesophageal reflux disease)   . Glaucoma   . Headache   . History of DVT (deep vein thrombosis) 1989   LEFT UPPER ARM  . History of hiatal hernia   . History of  MRSA infection 2011  . Hypertension   . Irregular heart rate    "years ago"  . Low iron   . Mild obstructive sleep apnea    per study 02-07-2006 - no cpap  . Neuropathy (HCC)    feet  . Pelvic pain in female   . Peripheral vascular disease ([place])    "poor circulation"  . Seasonal allergies   . Short of breath on exertion   . Urinary frequency   . Weakness    both hands and feet    Past Surgical History:  Procedure Laterality Date  . abdominal adhesions removed    . ABDOMINAL HYSTERECTOMY  1978  . BUNIONECTOMY Left 2008  . COLONOSCOPY    . CYSTO WITH HYDRODISTENSION N/A 07/12/2015   Procedure: CYSTOSCOPY/HYDRODISTENSION;  Surgeon: Bjorn Loser, MD;  Location: [facility];  Service: Urology;  Laterality: N/A;  . ESOPHAGEAL MANOMETRY N/A 11/22/2014   Procedure: ESOPHAGEAL MANOMETRY (EM);  Surgeon: Winfield Cunas., MD;  Location: [facility];  Service: Endoscopy;  Laterality: N/A;  . ESOPHAGOGASTRODUODENOSCOPY N/A 07/15/2013   Procedure: ESOPHAGOGASTRODUODENOSCOPY (EGD);  Surgeon: Winfield Cunas., MD;  Location: [facility];  Service: Endoscopy;  Laterality: N/A;  need xray  . EYE SURGERY Bilateral    cataract surgery with lens implants  .  HARDWARE REMOVAL N/A 01/02/2016   Procedure: Exploration of Lumbar Fusion,Removal of hardware Lumbar One-Two ;Redo Posterior Lumbar Fusion Lumbar One-Two;  Surgeon: Kary Kos, MD;  Location: [facility];  Service: Neurosurgery;  Laterality: N/A;  . LAPAROSCOPIC CHOLECYSTECTOMY  01-11-2006  . LUMBAR FUSION  2014   L4 -- L5  . MASS EXCISION Left 02/20/2013   Procedure: EXCISION LEFT BUTTOCK  MASS;  Surgeon: Adin Hector, MD;  Location: [facility];  Service: General;  Laterality: Left;  . NOSE SURGERY  2007  . REMOVAL HARDWARE L4-L5/  BILATERAL LAMINECTOMY L2 - L5 AND FUSION  06-12-2011  . SHOULDER OPEN ROTATOR CUFF REPAIR Left 05/05/2014   Procedure: OPEN ACROMIONECTOMY AND OPEN REPAIR OF ROTATOR CUFF, TISSUEMEND GRAFT  WITH ANCHOR ;  Surgeon: Tobi Bastos, MD;  Location: [facility];  Service: Orthopedics;  Laterality: Left;  . SPINE SURGERY    . TONSILLECTOMY       reports that she quit smoking about 47 years ago. She has never used smokeless tobacco. She reports that she drinks alcohol. She reports that she does not use drugs.  She reports RARE, social EtOH use. She is married.  She has one adult daughter.  Allergies  Allergen Reactions  . Latex Other (See Comments)    Sores, blisters - reaction to gloves  . Penicillins Other (See Comments)    Pt was told she was allergic to penicillin in her mid 20'2 - does not recall the reaction. She has taken amoxicillin and ampicillin with no reaction.     Family History  Problem Relation Age of Onset  . Hypertension Mother   . Cancer Mother     cervix  . Kidney disease Mother   . Hypertension Sister   . Cancer Sister     polyps  . Kidney disease Brother   . Cancer Daughter     leukemia  . Hypertension Brother      Prior to Admission medications   Medication Sig Start Date End Date Taking? Authorizing Provider  acetaminophen (TYLENOL) 500 MG tablet Take 1,000-1,500 mg by mouth 4 (four) times daily as needed (pain).   Yes Historical Provider, MD  B Complex Vitamins (VITAMIN B COMPLEX PO) Take 1 tablet by mouth daily.   Yes Historical Provider, MD  Calcium Carbonate-Vitamin D (CALCIUM-VITAMIN D3 PO) Take 1 tablet by mouth daily.   Yes Historical Provider, MD  cloNIDine (CATAPRES) 0.1 MG tablet Take 0.1 mg by mouth 3 (three) times daily.    Yes Historical Provider, MD  diphenhydrAMINE (BENADRYL) 25 mg capsule Take 25 mg by mouth 2 (two) times daily as needed for allergies.   Yes Historical Provider, MD  estradiol (ESTRACE) 0.1 MG/GM vaginal cream Place 1 Applicatorful vaginally 2 (two) times a week. Tuesdays and Thursdays   Yes Historical Provider, MD  fluticasone (FLONASE) 50 MCG/ACT nasal spray Place 1 spray into both nostrils daily.  06/14/16  Yes  Historical Provider, MD  gabapentin (NEURONTIN) 300 MG capsule Take 300 mg by mouth 2 (two) times daily.   Yes Historical Provider, MD  glycopyrrolate (ROBINUL) 2 MG tablet Take 2 mg by mouth 2 (two) times daily.  12/30/12  Yes Historical Provider, MD  hydrALAZINE (APRESOLINE) 25 MG tablet Take 25 mg by mouth 3 (three) times daily.   Yes Historical Provider, MD  L-Methylfolate-Algae-B12-B6 [place]ade [place]) 3-90.314-2-35 MG CAPS Take 1 capsule by mouth 2 (two) times daily.    Yes Historical Provider, MD  losartan-hydrochlorothiazide (HYZAAR) 100-25 MG per tablet Take 1  tablet by mouth daily. Reported on 01/31/2016 01/14/13  Yes Historical Provider, MD  meloxicam (MOBIC) 15 MG tablet Take 15 mg by mouth daily.  06/18/16  Yes Historical Provider, MD  Menthol, Topical Analgesic, (ICY HOT EX) Apply 1 patch topically daily as needed (pain). patches   Yes Historical Provider, MD  Menthol, Topical Analgesic, (ICY HOT EX) Apply 1 application topically 2 (two) times daily as needed (pain). Gel   Yes Historical Provider, MD  Multiple Vitamin (MULTIVITAMIN WITH MINERALS) TABS Take 1 tablet by mouth daily.   Yes Historical Provider, MD  NIFEdipine (PROCARDIA) 10 MG capsule Take 10 mg by mouth 3 (three) times daily. 01/27/16  Yes Historical Provider, MD  pantoprazole (PROTONIX) 40 MG tablet Take 40 mg by mouth daily.    Yes Historical Provider, MD  PARoxetine (PAXIL) 40 MG tablet Take 40 mg by mouth daily. Reported on 01/18/2016 12/15/15  Yes Historical Provider, MD  polyethylene glycol (MIRALAX / GLYCOLAX) packet Take 17 g by mouth daily as needed (constipation).    Yes Historical Provider, MD  potassium chloride SA (K-DUR,KLOR-CON) 20 MEQ tablet Take 20 mEq by mouth daily.    Yes Historical Provider, MD  sennosides-docusate sodium (SENOKOT-S) 8.6-50 MG tablet Take 2 tablets by mouth at bedtime as needed for constipation.    Yes Historical Provider, MD  silodosin (RAPAFLO) 8 MG CAPS capsule Take 8 mg by mouth daily as needed  (difficulty urinating).   Yes Historical Provider, MD  vitamin E (VITAMIN E) 400 UNIT capsule Take 400 Units by mouth daily.   Yes Historical Provider, MD    Physical Exam: Vitals:   07/02/16 2145 07/02/16 2200 07/02/16 2215 07/02/16 2230  BP: (!) 185/104 177/100 178/99 163/99  Pulse: 103 106 106 113  Resp: (!) 28 (!) 30 26 26   SpO2: (!) 79% 94% 94% 94%  Weight:      Height:          Constitutional: NAD, ill appearing Vitals:   07/02/16 2145 07/02/16 2200 07/02/16 2215 07/02/16 2230  BP: (!) 185/104 177/100 178/99 163/99  Pulse: 103 106 106 113  Resp: (!) 28 (!) 30 26 26   SpO2: (!) 79% 94% 94% 94%  Weight:      Height:       Eyes: PERRL, lids and conjunctivae normal ENMT: Mucous membranes are dry. Normal dentition.  NG tube in place. Neck: normal appearance, supple Respiratory: clear to auscultation bilaterally, no wheezing, no crackles. Normal respiratory effort. No accessory muscle use.  Cardiovascular: Tachycardic but regular.  No murmurs / rubs / gallops. No extremity edema. 2+ pedal pulses.   GI: abdomen is distended and diffusely tender.  Bowel sounds are hypoactive. Musculoskeletal:  No joint deformity in upper and lower extremities. Good ROM, no contractures. Normal muscle tone.  Skin: no rashes, warm and dry Neurologic: No focal deficits. Psychiatric: Normal judgment and insight. Alert and oriented x 3. Normal mood.     Labs on Admission: I have personally reviewed following labs and imaging studies  CBC:  Recent Labs Lab 07/02/16 1650  WBC 10.1  NEUTROABS 8.2*  HGB 15.4*  HCT 45.4  MCV 91.3  PLT [id number]   Basic Metabolic Panel:  Recent Labs Lab 06/27/16 1823 07/02/16 1650  NA  --  134*  K  --  3.7  CL  --  100*  CO2  --  20*  GLUCOSE  --  160*  BUN  --  23*  CREATININE  --  0.87  CALCIUM  --  9.9  MG 2.5*  --    GFR: Estimated Creatinine Clearance: 77.3 mL/min (by C-G formula based on SCr of 0.87 mg/dL). Liver Function Tests:  Recent  Labs Lab 07/02/16 1650  AST 210*  ALT 149*  ALKPHOS 113  BILITOT 1.8*  PROT 8.3*  ALBUMIN 4.1    Recent Labs Lab 07/02/16 1650  LIPASE 3,627*   Urine analysis: Pending  Radiological Exams on Admission: Dg Chest 2 View  Result Date: 07/02/2016 CLINICAL DATA:  Mid lower chest and left sided chest pain up into left shoulder. Severe abd pain and tightness. Dizziness. N/V. SOB. Rapid heartbeat. Skin feels cold - almost clammy, but she states she is burning up. Symptoms have increased in severity and frequency over the last week. EXAM: CHEST  2 VIEW COMPARISON:  01/05/2016 FINDINGS: Lung volumes are low. There is linear/streaky type opacity at the lung bases there is most likely atelectasis. Infection is possible. Remainder of the lungs is clear. No pleural effusion or pneumothorax. Cardiac silhouette is normal in size. No mediastinal or hilar masses. No evidence of adenopathy. Skeletal structures are intact. IMPRESSION: 1. Lung base opacity accentuated by low lung volumes. This is most likely atelectasis. Consider pneumonia if there are consistent clinical symptoms. 2. No other evidence of acute cardiopulmonary disease. Electronically Signed   By: Lajean Manes M.D.   On: 07/02/2016 16:38   Ct Abdomen Pelvis W Contrast  Result Date: 07/02/2016 CLINICAL DATA:  Epigastric abdominal pain for 5 days. Vomiting starting last night. Prior hysterectomy. Prior abdominal adhesion removal. EXAM: CT ABDOMEN AND PELVIS WITH CONTRAST TECHNIQUE: Multidetector CT imaging of the abdomen and pelvis was performed using the standard protocol following bolus administration of intravenous contrast. CONTRAST:  164mL ISOVUE-300 IOPAMIDOL (ISOVUE-300) INJECTION 61% COMPARISON:  01/30/2016 FINDINGS: Lower chest: Bibasilar atelectasis. Normal heart size without pericardial or pleural effusion. Fluid in the distal esophagus. Hepatobiliary: Normal liver. Cholecystectomy, without biliary ductal dilatation. Pancreas: Normal,  without mass or ductal dilatation. Spleen: Normal in size, without focal abnormality. Adrenals/Urinary Tract: Normal right adrenal gland. Left adrenal nodule measures 1.7 cm and is been similar in back to 06/23/2015, consistent with an adenoma on that study. Normal kidneys, without hydronephrosis. Normal urinary bladder. Stomach/Bowel: The stomach is distended and fluid-filled. The colon is normal to minimally decompressed distally. Decompressed terminal and distal ileum. Proximal and mid small bowel loops are dilated, including at 3.7 cm on image 56/series 2. Relatively gradual transition occurs, including within left paracentral anteriorly positioned small bowel loops on image 52/series 2. No obstructive mass. No findings to suggest complicating ischemia. Minimal nonspecific mesenteric interloop edema is seen including on image 56/series 2. Vascular/Lymphatic: Advanced aortic and branch vessel atherosclerosis. No abdominopelvic adenopathy. Reproductive: Hysterectomy.  No adnexal mass. Other: No significant free fluid. No pneumatosis or free intraperitoneal air. Musculoskeletal: Lumbosacral spine fixation. IMPRESSION: 1. Mid small bowel obstruction, likely secondary to adhesions. No complicating ischemia. 2. Distended, fluid-filled stomach with fluid in the esophagus. The patient would likely benefit from nasogastric tube placement. 3. Advanced aortic atherosclerosis. 4. Left adrenal adenoma. Electronically Signed   By: Abigail Miyamoto M.D.   On: 07/02/2016 20:44   Dg Abd Portable 1 View  Result Date: 07/02/2016 CLINICAL DATA:  NG tube placement. EXAM: PORTABLE ABDOMEN - 1 VIEW COMPARISON:  CT abdomen/ pelvis earlier this day FINDINGS: Tip and side port of the enteric tube below the diaphragm in the stomach. Gaseous gastric distention. Small bowel dilatation is partially included. Bibasilar atelectasis is seen. IMPRESSION: Tip and side  port of the enteric tube below the diaphragm in the stomach. Electronically  Signed   By: Jeb Levering M.D.   On: 07/02/2016 23:25    EKG: Independently reviewed. NSR.  No acute ST segment changes.  Assessment/Plan Principal Problem:   SBO (small bowel obstruction) Active Problems:   Essential hypertension   Allergic rhinitis   GERD (gastroesophageal reflux disease)   Chronic pain   Pancreatitis      SBO --General surgery consult pending --NG tube in place --NPO (holding home medications) --Analgesics and antiemetics as needed --NS maintenance fluids  Acute pancreatitis, I believe abnormal LFTs are related at this point --NPO --Antiemetics and analgesics as needed --NS maintenance fluids --RUQ ultrasound --Repeat lipase, LFTs in the AM --Fasting lipid panel in the AM --May need GI consult  HTN --Clonidine patch, IV hydralazine prn while NPO  GERD --IV Pepcid BID  DVT prophylaxis: Lovenox Code Status: DNR Family Communication: Patient's husband present in the ED at time of admission Disposition Plan: To be determined Consults called: General Surgery (Dr. Kieth Brightly) Admission status: Inpatient, med surg   TIME SPENT: 38 minutes   Eber Jones MD [facility] Pager [phone]  If 7PM-7AM, please contact night-coverage [email or web address] Password [email or web address]  07/02/2016, 11:30 PM

## 2016-07-02 NOTE — ED Triage Notes (Signed)
C/O of centralized chest pressure that stared last night around 9 with nausea, vomiting, and diarrhea. Pt. sts she is feeling a little lightheaded and weak. Received 1 nitro, 324 ASA, and 8 zofran in the truck.

## 2016-07-03 ENCOUNTER — Inpatient Hospital Stay (HOSPITAL_COMMUNITY): Payer: Medicare Other

## 2016-07-03 ENCOUNTER — Encounter (HOSPITAL_COMMUNITY): Payer: Self-pay | Admitting: General Practice

## 2016-07-03 LAB — COMPREHENSIVE METABOLIC PANEL
ALT: 111 U/L — ABNORMAL HIGH (ref 14–54)
AST: 72 U/L — ABNORMAL HIGH (ref 15–41)
Albumin: 3.5 g/dL (ref 3.5–5.0)
Alkaline Phosphatase: 94 U/L (ref 38–126)
Anion gap: 10 (ref 5–15)
BUN: 21 mg/dL — ABNORMAL HIGH (ref 6–20)
CO2: 24 mmol/L (ref 22–32)
Calcium: 9.1 mg/dL (ref 8.9–10.3)
Chloride: 102 mmol/L (ref 101–111)
Creatinine, Ser: 0.87 mg/dL (ref 0.44–1.00)
GFR calc Af Amer: >60 mL/min (ref >60)
GFR calc non Af Amer: >60 mL/min (ref >60)
Glucose, Bld: 138 mg/dL — ABNORMAL HIGH (ref 65–99)
Potassium: 3.5 mmol/L (ref 3.5–5.1)
Sodium: 136 mmol/L (ref 135–145)
Total Bilirubin: 1.1 mg/dL (ref 0.3–1.2)
Total Protein: 7.2 g/dL (ref 6.5–8.1)

## 2016-07-03 LAB — CBC WITH DIFFERENTIAL/PLATELET
Basophils Absolute: 0 10*3/uL (ref 0.0–0.1)
Basophils Relative: 0 %
Eosinophils Absolute: 0 10*3/uL (ref 0.0–0.7)
Eosinophils Relative: 0 %
HCT: 44.1 % (ref 36.0–46.0)
Hemoglobin: 14.5 g/dL (ref 12.0–15.0)
Lymphocytes Relative: 16 %
Lymphs Abs: 0.7 10*3/uL (ref 0.7–4.0)
MCH: 30.2 pg (ref 26.0–34.0)
MCHC: 32.9 g/dL (ref 30.0–36.0)
MCV: 91.9 fL (ref 78.0–100.0)
Monocytes Absolute: 0.8 10*3/uL (ref 0.1–1.0)
Monocytes Relative: 17 %
Neutro Abs: 3 10*3/uL (ref 1.7–7.7)
Neutrophils Relative %: 67 %
Platelets: 307 10*3/uL (ref 150–400)
RBC: 4.8 MIL/uL (ref 3.87–5.11)
RDW: 15.3 % (ref 11.5–15.5)
WBC: 4.5 10*3/uL (ref 4.0–10.5)

## 2016-07-03 LAB — URINE MICROSCOPIC-ADD ON

## 2016-07-03 LAB — APTT: aPTT: 32 seconds (ref 24–36)

## 2016-07-03 LAB — URINALYSIS, ROUTINE W REFLEX MICROSCOPIC
Glucose, UA: NEGATIVE mg/dL
Hgb urine dipstick: NEGATIVE
Ketones, ur: NEGATIVE mg/dL
Leukocytes, UA: NEGATIVE
Nitrite: NEGATIVE
Protein, ur: 100 mg/dL — AB
Specific Gravity, Urine: >1.046 — ABNORMAL HIGH (ref 1.005–1.030)
pH: 6 (ref 5.0–8.0)

## 2016-07-03 LAB — LIPID PANEL
Cholesterol: 165 mg/dL (ref 0–200)
HDL: 82 mg/dL (ref >40)
LDL Cholesterol: 75 mg/dL (ref 0–99)
Total CHOL/HDL Ratio: 2 RATIO
Triglycerides: 39 mg/dL (ref <150)
VLDL: 8 mg/dL (ref 0–40)

## 2016-07-03 LAB — LIPASE, BLOOD: Lipase: 627 U/L — ABNORMAL HIGH (ref 11–51)

## 2016-07-03 LAB — PROTIME-INR
INR: 1.08
Prothrombin Time: 14 seconds (ref 11.4–15.2)

## 2016-07-03 MED ORDER — ORAL CARE MOUTH RINSE
15.0000 mL | Freq: Two times a day (BID) | OROMUCOSAL | Status: DC
  Administered 2016-07-03 – 2016-07-16 (×21): 15 mL via OROMUCOSAL

## 2016-07-03 MED ORDER — DIATRIZOATE MEGLUMINE & SODIUM 66-10 % PO SOLN
90.0000 mL | Freq: Once | ORAL | Status: AC
  Administered 2016-07-03: 90 mL via NASOGASTRIC
  Filled 2016-07-03: qty 90

## 2016-07-03 MED ORDER — CHLORHEXIDINE GLUCONATE 0.12 % MT SOLN
15.0000 mL | Freq: Two times a day (BID) | OROMUCOSAL | Status: DC
  Administered 2016-07-03 – 2016-07-16 (×25): 15 mL via OROMUCOSAL
  Filled 2016-07-03 (×22): qty 15

## 2016-07-03 MED ORDER — ACETAMINOPHEN 325 MG PO TABS
650.0000 mg | ORAL_TABLET | Freq: Four times a day (QID) | ORAL | Status: DC | PRN
  Administered 2016-07-10: 650 mg via ORAL
  Filled 2016-07-03: qty 2

## 2016-07-03 MED ORDER — PHENOL 1.4 % MT LIQD
2.0000 | OROMUCOSAL | Status: DC | PRN
  Administered 2016-07-03: 2 via OROMUCOSAL
  Filled 2016-07-03: qty 177

## 2016-07-03 MED ORDER — FAMOTIDINE IN NACL 20-0.9 MG/50ML-% IV SOLN
20.0000 mg | Freq: Two times a day (BID) | INTRAVENOUS | Status: DC
  Administered 2016-07-03 – 2016-07-13 (×21): 20 mg via INTRAVENOUS
  Filled 2016-07-03 (×23): qty 50

## 2016-07-03 MED ORDER — MORPHINE SULFATE (PF) 2 MG/ML IV SOLN
2.0000 mg | INTRAVENOUS | Status: DC | PRN
  Administered 2016-07-03 – 2016-07-05 (×8): 2 mg via INTRAVENOUS
  Filled 2016-07-03 (×8): qty 1

## 2016-07-03 MED ORDER — SODIUM CHLORIDE 0.9 % IV BOLUS (SEPSIS)
500.0000 mL | Freq: Once | INTRAVENOUS | Status: AC
  Administered 2016-07-03: 500 mL via INTRAVENOUS

## 2016-07-03 MED ORDER — ACETAMINOPHEN 650 MG RE SUPP: 650.0000 mg | Freq: Four times a day (QID) | RECTAL | Status: DC | PRN

## 2016-07-03 MED ORDER — ONDANSETRON HCL 4 MG PO TABS: 4.0000 mg | ORAL_TABLET | Freq: Four times a day (QID) | ORAL | Status: DC | PRN

## 2016-07-03 MED ORDER — SODIUM CHLORIDE 0.9 % IV SOLN
INTRAVENOUS | Status: DC
  Administered 2016-07-03 – 2016-07-06 (×11): via INTRAVENOUS

## 2016-07-03 MED ORDER — ENOXAPARIN SODIUM 60 MG/0.6ML ~~LOC~~ SOLN
60.0000 mg | SUBCUTANEOUS | Status: DC
  Administered 2016-07-03 – 2016-07-05 (×3): 60 mg via SUBCUTANEOUS
  Filled 2016-07-03 (×3): qty 0.6

## 2016-07-03 MED ORDER — HYDRALAZINE HCL 20 MG/ML IJ SOLN: 20.0000 mg | Freq: Four times a day (QID) | INTRAMUSCULAR | Status: DC | PRN

## 2016-07-03 MED ORDER — CLONIDINE HCL 0.1 MG/24HR TD PTWK
0.1000 mg | MEDICATED_PATCH | TRANSDERMAL | Status: DC
  Administered 2016-07-03 – 2016-07-10 (×2): 0.1 mg via TRANSDERMAL
  Filled 2016-07-03 (×4): qty 1

## 2016-07-03 MED ORDER — ONDANSETRON HCL 4 MG/2ML IJ SOLN
4.0000 mg | Freq: Four times a day (QID) | INTRAMUSCULAR | Status: DC | PRN
  Administered 2016-07-03 – 2016-07-04 (×3): 4 mg via INTRAVENOUS
  Filled 2016-07-03 (×3): qty 2

## 2016-07-03 NOTE — Progress Notes (Signed)
Patient hasn't voided since 7am. Bladder scan shows 112cc urine. texted paged Dr. Cruzita Lederer  Orders received.

## 2016-07-03 NOTE — Consult Note (Signed)
Reason for Consult: bowel obstruction Referring Physician: Deno Etienne, DO  Amy [name] is an 70 y.o. female.  HPI: 70 yo female with 2 days of abdominal pain, nausea and vomiting. She notes having malaise for the last week but, 2 days ago developed abdominal pain and vomiting bilious liquids. Her last bowel movement was 2 days ago, she does not recall having flatus since then. She has had one similar occurrence in 1994 and underwent "adhesions surgery". No fevers or chills.  Past Medical History:  Diagnosis Date  . Abdominal pain of unknown etiology 02/05/2016  . Anxiety   . Arthritis    ddd- RA  . Asthma    "sleeping asthma"  . Chronic back pain    lumbar steroid injection's  . Constipation   . Depression   . Dysphonia    intermittent "voice changes"  . Fibromyalgia   . GERD (gastroesophageal reflux disease)   . Glaucoma   . Headache   . History of DVT (deep vein thrombosis) 1989   LEFT UPPER ARM  . History of hiatal hernia   . History of MRSA infection 2011  . Hypertension   . Irregular heart rate    "years ago"  . Low iron   . Mild obstructive sleep apnea    per study 02-07-2006 - no cpap  . Neuropathy (HCC)    feet  . Pelvic pain in female   . Peripheral vascular disease ([place])    "poor circulation"  . Seasonal allergies   . Short of breath on exertion   . Urinary frequency   . Weakness    both hands and feet    Past Surgical History:  Procedure Laterality Date  . abdominal adhesions removed    . ABDOMINAL HYSTERECTOMY  1978  . BUNIONECTOMY Left 2008  . COLONOSCOPY    . CYSTO WITH HYDRODISTENSION N/A 07/12/2015   Procedure: CYSTOSCOPY/HYDRODISTENSION;  Surgeon: Bjorn Loser, MD;  Location: [facility];  Service: Urology;  Laterality: N/A;  . ESOPHAGEAL MANOMETRY N/A 11/22/2014   Procedure: ESOPHAGEAL MANOMETRY (EM);  Surgeon: Winfield Cunas., MD;  Location: [facility];  Service: Endoscopy;  Laterality: N/A;  .  ESOPHAGOGASTRODUODENOSCOPY N/A 07/15/2013   Procedure: ESOPHAGOGASTRODUODENOSCOPY (EGD);  Surgeon: Winfield Cunas., MD;  Location: [facility];  Service: Endoscopy;  Laterality: N/A;  need xray  . EYE SURGERY Bilateral    cataract surgery with lens implants  . HARDWARE REMOVAL N/A 01/02/2016   Procedure: Exploration of Lumbar Fusion,Removal of hardware Lumbar One-Two ;Redo Posterior Lumbar Fusion Lumbar One-Two;  Surgeon: Kary Kos, MD;  Location: [facility];  Service: Neurosurgery;  Laterality: N/A;  . LAPAROSCOPIC CHOLECYSTECTOMY  01-11-2006  . LUMBAR FUSION  2014   L4 -- L5  . MASS EXCISION Left 02/20/2013   Procedure: EXCISION LEFT BUTTOCK  MASS;  Surgeon: Adin Hector, MD;  Location: [facility];  Service: General;  Laterality: Left;  . NOSE SURGERY  2007  . REMOVAL HARDWARE L4-L5/  BILATERAL LAMINECTOMY L2 - L5 AND FUSION  06-12-2011  . SHOULDER OPEN ROTATOR CUFF REPAIR Left 05/05/2014   Procedure: OPEN ACROMIONECTOMY AND OPEN REPAIR OF ROTATOR CUFF, TISSUEMEND GRAFT WITH ANCHOR ;  Surgeon: Tobi Bastos, MD;  Location: [facility];  Service: Orthopedics;  Laterality: Left;  . SPINE SURGERY    . TONSILLECTOMY      Family History  Problem Relation Age of Onset  . Hypertension Mother   . Cancer Mother     cervix  .  Kidney disease Mother   . Hypertension Sister   . Cancer Sister     polyps  . Kidney disease Brother   . Cancer Daughter     leukemia  . Hypertension Brother     Social History:  reports that she quit smoking about 47 years ago. She has never used smokeless tobacco. She reports that she drinks alcohol. She reports that she does not use drugs.  Allergies:  Allergies  Allergen Reactions  . Latex Other (See Comments)    Sores, blisters - reaction to gloves  . Penicillins Other (See Comments)    Pt was told she was allergic to penicillin in her mid 20'2 - does not recall the reaction. She has taken amoxicillin and ampicillin with no reaction.     Medications:  I have reviewed the patient's current medications.  Results for orders placed or performed during the hospital encounter of 07/02/16 (from the past 48 hour(s))  CBC with Differential/Platelet     Status: Abnormal   Collection Time: 07/02/16  4:50 PM  Result Value Ref Range   WBC 10.1 4.0 - 10.5 K/uL   RBC 4.97 3.87 - 5.11 MIL/uL   Hemoglobin 15.4 (H) 12.0 - 15.0 g/dL   HCT 45.4 36.0 - 46.0 %   MCV 91.3 78.0 - 100.0 fL   MCH 31.0 26.0 - 34.0 pg   MCHC 33.9 30.0 - 36.0 g/dL   RDW 14.9 11.5 - 15.5 %   Platelets 323 150 - 400 K/uL   Neutrophils Relative % 81 %   Lymphocytes Relative 9 %   Monocytes Relative 10 %   Eosinophils Relative 0 %   Basophils Relative 0 %   Band Neutrophils 0 %   Metamyelocytes Relative 0 %   Myelocytes 0 %   Promyelocytes Absolute 0 %   Blasts 0 %   nRBC 0 0 /100 WBC   Other 0 %   Neutro Abs 8.2 (H) 1.7 - 7.7 K/uL   Lymphs Abs 0.9 0.7 - 4.0 K/uL   Monocytes Absolute 1.0 0.1 - 1.0 K/uL   Eosinophils Absolute 0.0 0.0 - 0.7 K/uL   Basophils Absolute 0.0 0.0 - 0.1 K/uL   WBC Morphology FEW NEUTROPHIL BANDS NOTED    Smear Review FIBRIN STRANDS NOTED   Comprehensive metabolic panel     Status: Abnormal   Collection Time: 07/02/16  4:50 PM  Result Value Ref Range   Sodium 134 (L) 135 - 145 mmol/L   Potassium 3.7 3.5 - 5.1 mmol/L   Chloride 100 (L) 101 - 111 mmol/L   CO2 20 (L) 22 - 32 mmol/L   Glucose, Bld 160 (H) 65 - 99 mg/dL   BUN 23 (H) 6 - 20 mg/dL   Creatinine, Ser 0.87 0.44 - 1.00 mg/dL   Calcium 9.9 8.9 - 10.3 mg/dL   Total Protein 8.3 (H) 6.5 - 8.1 g/dL   Albumin 4.1 3.5 - 5.0 g/dL   AST 210 (H) 15 - 41 U/L   ALT 149 (H) 14 - 54 U/L   Alkaline Phosphatase 113 38 - 126 U/L   Total Bilirubin 1.8 (H) 0.3 - 1.2 mg/dL   GFR calc non Af Amer >60 >60 mL/min   GFR calc Af Amer >60 >60 mL/min    Comment: (NOTE) The eGFR has been calculated using the CKD EPI equation. This calculation has not been validated in all clinical situations. eGFR's  persistently <60 mL/min signify possible Chronic Kidney Disease.    Anion gap  14 5 - 15  Lipase, blood     Status: Abnormal   Collection Time: 07/02/16  4:50 PM  Result Value Ref Range   Lipase 3,627 (H) 11 - 51 U/L    Comment: RESULTS CONFIRMED BY MANUAL DILUTION  I-stat troponin, ED     Status: None   Collection Time: 07/02/16  5:12 PM  Result Value Ref Range   Troponin i, poc 0.05 0.00 - 0.08 ng/mL   Comment 3            Comment: Due to the release kinetics of cTnI, a negative result within the first hours of the onset of symptoms does not rule out myocardial infarction with certainty. If myocardial infarction is still suspected, repeat the test at appropriate intervals.   Lactate dehydrogenase     Status: Abnormal   Collection Time: 07/02/16  6:44 PM  Result Value Ref Range   LDH 275 (H) 98 - 192 U/L    Dg Chest 2 View  Result Date: 07/02/2016 CLINICAL DATA:  Mid lower chest and left sided chest pain up into left shoulder. Severe abd pain and tightness. Dizziness. N/V. SOB. Rapid heartbeat. Skin feels cold - almost clammy, but she states she is burning up. Symptoms have increased in severity and frequency over the last week. EXAM: CHEST  2 VIEW COMPARISON:  01/05/2016 FINDINGS: Lung volumes are low. There is linear/streaky type opacity at the lung bases there is most likely atelectasis. Infection is possible. Remainder of the lungs is clear. No pleural effusion or pneumothorax. Cardiac silhouette is normal in size. No mediastinal or hilar masses. No evidence of adenopathy. Skeletal structures are intact. IMPRESSION: 1. Lung base opacity accentuated by low lung volumes. This is most likely atelectasis. Consider pneumonia if there are consistent clinical symptoms. 2. No other evidence of acute cardiopulmonary disease. Electronically Signed   By: Lajean Manes M.D.   On: 07/02/2016 16:38   Ct Abdomen Pelvis W Contrast  Result Date: 07/02/2016 CLINICAL DATA:  Epigastric abdominal  pain for 5 days. Vomiting starting last night. Prior hysterectomy. Prior abdominal adhesion removal. EXAM: CT ABDOMEN AND PELVIS WITH CONTRAST TECHNIQUE: Multidetector CT imaging of the abdomen and pelvis was performed using the standard protocol following bolus administration of intravenous contrast. CONTRAST:  144m ISOVUE-300 IOPAMIDOL (ISOVUE-300) INJECTION 61% COMPARISON:  01/30/2016 FINDINGS: Lower chest: Bibasilar atelectasis. Normal heart size without pericardial or pleural effusion. Fluid in the distal esophagus. Hepatobiliary: Normal liver. Cholecystectomy, without biliary ductal dilatation. Pancreas: Normal, without mass or ductal dilatation. Spleen: Normal in size, without focal abnormality. Adrenals/Urinary Tract: Normal right adrenal gland. Left adrenal nodule measures 1.7 cm and is been similar in back to 06/23/2015, consistent with an adenoma on that study. Normal kidneys, without hydronephrosis. Normal urinary bladder. Stomach/Bowel: The stomach is distended and fluid-filled. The colon is normal to minimally decompressed distally. Decompressed terminal and distal ileum. Proximal and mid small bowel loops are dilated, including at 3.7 cm on image 56/series 2. Relatively gradual transition occurs, including within left paracentral anteriorly positioned small bowel loops on image 52/series 2. No obstructive mass. No findings to suggest complicating ischemia. Minimal nonspecific mesenteric interloop edema is seen including on image 56/series 2. Vascular/Lymphatic: Advanced aortic and branch vessel atherosclerosis. No abdominopelvic adenopathy. Reproductive: Hysterectomy.  No adnexal mass. Other: No significant free fluid. No pneumatosis or free intraperitoneal air. Musculoskeletal: Lumbosacral spine fixation. IMPRESSION: 1. Mid small bowel obstruction, likely secondary to adhesions. No complicating ischemia. 2. Distended, fluid-filled stomach with fluid in the esophagus.  The patient would likely benefit  from nasogastric tube placement. 3. Advanced aortic atherosclerosis. 4. Left adrenal adenoma. Electronically Signed   By: Abigail Miyamoto M.D.   On: 07/02/2016 20:44   Dg Abd Portable 1 View  Result Date: 07/02/2016 CLINICAL DATA:  NG tube placement. EXAM: PORTABLE ABDOMEN - 1 VIEW COMPARISON:  CT abdomen/ pelvis earlier this day FINDINGS: Tip and side port of the enteric tube below the diaphragm in the stomach. Gaseous gastric distention. Small bowel dilatation is partially included. Bibasilar atelectasis is seen. IMPRESSION: Tip and side port of the enteric tube below the diaphragm in the stomach. Electronically Signed   By: Jeb Levering M.D.   On: 07/02/2016 23:25   US Abdomen Limited Ruq  Result Date: 07/03/2016 CLINICAL DATA:  Elevated LFTs.  Initial encounter. EXAM: US ABDOMEN LIMITED - RIGHT UPPER QUADRANT COMPARISON:  CT of the abdomen and pelvis from 07/02/2016 FINDINGS: Gallbladder: Status post cholecystectomy.  No retained stones seen. Common bile duct: Diameter: 0.3 cm, within normal limits in caliber. Difficult to fully characterize due to overlying structures. Liver: No focal lesion identified. Within normal limits in parenchymal echogenicity. IMPRESSION: Status post cholecystectomy. No retained stones seen. Unremarkable ultrasound of the right upper quadrant. On further assessment of the recent CT of the abdomen and pelvis, there is soft tissue inflammation tracking about the pancreatic head and second segment of the duodenum. Given clinical concern, this could reflect mild acute pancreatitis. There is no evidence of devascularization or pseudocyst formation on the recent prior CT. Note that the pancreas is typically not well assessed on ultrasound. Electronically Signed   By: Garald Balding M.D.   On: 07/03/2016 03:58    Review of Systems  Constitutional: Negative for chills and fever.  HENT: Negative for hearing loss.   Eyes: Negative for blurred vision and double vision.   Respiratory: Negative for cough and hemoptysis.   Cardiovascular: Negative for chest pain and palpitations.  Gastrointestinal: Positive for abdominal pain, nausea and vomiting. Negative for diarrhea.  Genitourinary: Negative for dysuria and urgency.  Musculoskeletal: Positive for back pain and myalgias. Negative for neck pain.  Skin: Negative for itching and rash.  Neurological: Negative for dizziness, tingling and headaches.  Endo/Heme/Allergies: Does not bruise/bleed easily.   Blood pressure (!) 148/83, pulse (!) 113, temperature 98.5 F (36.9 C), temperature source Oral, resp. rate 19, height 5' 5"  (1.651 m), weight 123.8 kg (273 lb), SpO2 93 %. Physical Exam  Nursing note and vitals reviewed. Constitutional: She is oriented to person, place, and time. She appears well-developed and well-nourished.  HENT:  Head: Normocephalic and atraumatic.  Eyes: Conjunctivae and EOM are normal. No scleral icterus.  Neck: Normal range of motion. Neck supple.  Cardiovascular: Normal rate and regular rhythm.   Respiratory: Effort normal and breath sounds normal. She has no wheezes. She has no rales. She exhibits no tenderness.  GI: Soft. She exhibits no distension. There is tenderness. There is no rebound and no guarding.  Musculoskeletal: Normal range of motion. She exhibits no edema.  Neurological: She is alert and oriented to person, place, and time.  Skin: Skin is warm and dry.  Psychiatric: She has a normal mood and affect. Her behavior is normal.    Assessment/Plan: 70 yo female with small bowel obstruction with fecalization on imaging. NG placed in Er with excellent position confirmed on XR. No cardinal signs of ischemia. Given hysterectomy and previous surgery for adhesive disease, likely this bowel obstruction is adhesive in nature. Therefore, I  recommend NG tube management, NPO, and gastrograffin administration with serial exams and serial XR to observe transit of contrast.  Arta Bruce  Kinsinger 07/03/2016, 4:29 AM

## 2016-07-03 NOTE — Progress Notes (Addendum)
PROGRESS NOTE  Amy [name] [id number] DOB: 17-Apr-1946 DOA: 07/02/2016 PCP: Blanchie Serve, MD   LOS: 1 day   Brief Narrative: Amy [name] is a 70 y.o. woman with a history of HTN, GERD, DVT (she is not on anticoagulation currently), and chronic pain who presents to the ED for evaluation of abdominal pain x 5 five days.  She does not recall a specific trigger.  She is not sure that food has made it worse because she has had decreased PO intake.  Within the past 24 hours, she has developed nausea and vomiting. She was also found to have an elevated lipase, CT scan showed small bowel obstruction. General surgery was consulted and patient was admitted to medicine service  Assessment & Plan: Principal Problem:   SBO (small bowel obstruction) Active Problems:   Essential hypertension   Allergic rhinitis   GERD (gastroesophageal reflux disease)   Chronic pain   Pancreatitis   Small bowel obstruction - Possibly related to adhesions related to previous surgeries, she has had history of the SBO in the past requiring LOA  - Gen. surgery consulted, appreciate input, serial abdominal x-rays - Continue NG tube, continue nothing by mouth, continue IV fluids, analgesics and antiemetics  Acute pancreatitis - CT scan however shows no significant pancreatic changes, abdominal ultrasound with minimal soft tissue inflammation around the pancreatic head concerning for mild acute pancreatitis, significant elevation of lipase could also be related to #1 - Treatment is similar to #1, continue nothing by mouth, continue IV fluids and symptom control  HTN - continue home medications with clonidine patch, blood pressure controlled at 128/85  GERD - pepcid  Elevated LFTs - related to ?pancreatitis, she is s/p cholecystectomy. LFs and lipase improving.  - CBD without dilatation so less likely choledocholithiasis, bilirubin is normal today   DVT prophylaxis: Lovenox Code  Status: DNR Family Communication: no family bedside Disposition Plan: home when ready   Consultants:   General surgery  Procedures:   None   Antimicrobials:  None    Subjective: - no chest pain, shortness of breath,  - Nausea resolved with the NG tube - Abdominal pain is improving    Objective: Vitals:   07/02/16 2315 07/02/16 2330 07/03/16 0044 07/03/16 0436  BP: 164/97 143/96 (!) 148/83 (!) 153/80  Pulse: 111 112 (!) 113 (!) 116  Resp: 16 24 19 18   Temp:   98.5 F (36.9 C) 97.9 F (36.6 C)  TempSrc:   Oral Oral  SpO2: 93% 93% 93% 92%  Weight:   123.8 kg (273 lb)   Height:   5\' 5"  (1.651 m)     Intake/Output Summary (Last 24 hours) at 07/03/16 1409 Last data filed at 07/03/16 1255  Gross per 24 hour  Intake           565.42 ml  Output             2625 ml  Net         -2059.58 ml   Filed Weights   07/02/16 1500 07/03/16 0044  Weight: 117.9 kg (260 lb) 123.8 kg (273 lb)    Examination: Constitutional: NAD Vitals:   07/02/16 2315 07/02/16 2330 07/03/16 0044 07/03/16 0436  BP: 164/97 143/96 (!) 148/83 (!) 153/80  Pulse: 111 112 (!) 113 (!) 116  Resp: 16 24 19 18   Temp:   98.5 F (36.9 C) 97.9 F (36.6 C)  TempSrc:   Oral Oral  SpO2: 93% 93% 93% 92%  Weight:  123.8 kg (273 lb)   Height:   5\' 5"  (1.651 m)    Eyes: PERRL, lids and conjunctivae normal ENMT: Mucous membranes are dry Neck: normal, supple, no masses, no thyromegaly Respiratory: clear to auscultation bilaterally, no wheezing, no crackles.  Cardiovascular: Regular rate and rhythm, no murmurs / rubs / gallops.  Abdomen: abdomen with tenderness throughout. No BS Musculoskeletal: no clubbing / cyanosis.  Skin: no rashes, lesions, ulcers. No induration Neurologic: non focal    Data Reviewed: I have personally reviewed following labs and imaging studies  CBC:  Recent Labs Lab 07/02/16 1650 07/03/16 0627  WBC 10.1 4.5  NEUTROABS 8.2* 3.0  HGB 15.4* 14.5  HCT 45.4 44.1  MCV 91.3  91.9  PLT 323 [id number]   Basic Metabolic Panel:  Recent Labs Lab 06/27/16 1823 07/02/16 1650 07/03/16 0627  NA  --  134* 136  K  --  3.7 3.5  CL  --  100* 102  CO2  --  20* 24  GLUCOSE  --  160* 138*  BUN  --  23* 21*  CREATININE  --  0.87 0.87  CALCIUM  --  9.9 9.1  MG 2.5*  --   --    GFR: Estimated Creatinine Clearance: 79.5 mL/min (by C-G formula based on SCr of 0.87 mg/dL). Liver Function Tests:  Recent Labs Lab 07/02/16 1650 07/03/16 0627  AST 210* 72*  ALT 149* 111*  ALKPHOS 113 94  BILITOT 1.8* 1.1  PROT 8.3* 7.2  ALBUMIN 4.1 3.5    Recent Labs Lab 07/02/16 1650 07/03/16 0627  LIPASE 3,627* 627*   No results for input(s): AMMONIA in the last 168 hours. Coagulation Profile:  Recent Labs Lab 07/03/16 0627  INR 1.08   Lipid Profile:  Recent Labs  07/03/16 0627  CHOL 165  HDL 82  LDLCALC 75  TRIG 39  CHOLHDL 2.0   Urine analysis:    Component Value Date/Time   COLORURINE AMBER (A) 07/03/2016 0721   APPEARANCEUR CLEAR 07/03/2016 0721   LABSPEC >1.046 (H) 07/03/2016 0721   PHURINE 6.0 07/03/2016 0721   GLUCOSEU NEGATIVE 07/03/2016 0721   HGBUR NEGATIVE 07/03/2016 0721   BILIRUBINUR SMALL (A) 07/03/2016 0721   KETONESUR NEGATIVE 07/03/2016 0721   PROTEINUR 100 (A) 07/03/2016 0721   UROBILINOGEN 1.0 04/21/2014 0756   NITRITE NEGATIVE 07/03/2016 0721   LEUKOCYTESUR NEGATIVE 07/03/2016 0721   Radiology Studies: Dg Chest 2 View  Result Date: 07/02/2016 CLINICAL DATA:  Mid lower chest and left sided chest pain up into left shoulder. Severe abd pain and tightness. Dizziness. N/V. SOB. Rapid heartbeat. Skin feels cold - almost clammy, but she states she is burning up. Symptoms have increased in severity and frequency over the last week. EXAM: CHEST  2 VIEW COMPARISON:  01/05/2016 FINDINGS: Lung volumes are low. There is linear/streaky type opacity at the lung bases there is most likely atelectasis. Infection is possible. Remainder of the lungs is  clear. No pleural effusion or pneumothorax. Cardiac silhouette is normal in size. No mediastinal or hilar masses. No evidence of adenopathy. Skeletal structures are intact. IMPRESSION: 1. Lung base opacity accentuated by low lung volumes. This is most likely atelectasis. Consider pneumonia if there are consistent clinical symptoms. 2. No other evidence of acute cardiopulmonary disease. Electronically Signed   By: Lajean Manes M.D.   On: 07/02/2016 16:38   Ct Abdomen Pelvis W Contrast  Result Date: 07/02/2016 CLINICAL DATA:  Epigastric abdominal pain for 5 days. Vomiting starting last night. Prior  hysterectomy. Prior abdominal adhesion removal. EXAM: CT ABDOMEN AND PELVIS WITH CONTRAST TECHNIQUE: Multidetector CT imaging of the abdomen and pelvis was performed using the standard protocol following bolus administration of intravenous contrast. CONTRAST:  118mL ISOVUE-300 IOPAMIDOL (ISOVUE-300) INJECTION 61% COMPARISON:  01/30/2016 FINDINGS: Lower chest: Bibasilar atelectasis. Normal heart size without pericardial or pleural effusion. Fluid in the distal esophagus. Hepatobiliary: Normal liver. Cholecystectomy, without biliary ductal dilatation. Pancreas: Normal, without mass or ductal dilatation. Spleen: Normal in size, without focal abnormality. Adrenals/Urinary Tract: Normal right adrenal gland. Left adrenal nodule measures 1.7 cm and is been similar in back to 06/23/2015, consistent with an adenoma on that study. Normal kidneys, without hydronephrosis. Normal urinary bladder. Stomach/Bowel: The stomach is distended and fluid-filled. The colon is normal to minimally decompressed distally. Decompressed terminal and distal ileum. Proximal and mid small bowel loops are dilated, including at 3.7 cm on image 56/series 2. Relatively gradual transition occurs, including within left paracentral anteriorly positioned small bowel loops on image 52/series 2. No obstructive mass. No findings to suggest complicating  ischemia. Minimal nonspecific mesenteric interloop edema is seen including on image 56/series 2. Vascular/Lymphatic: Advanced aortic and branch vessel atherosclerosis. No abdominopelvic adenopathy. Reproductive: Hysterectomy.  No adnexal mass. Other: No significant free fluid. No pneumatosis or free intraperitoneal air. Musculoskeletal: Lumbosacral spine fixation. IMPRESSION: 1. Mid small bowel obstruction, likely secondary to adhesions. No complicating ischemia. 2. Distended, fluid-filled stomach with fluid in the esophagus. The patient would likely benefit from nasogastric tube placement. 3. Advanced aortic atherosclerosis. 4. Left adrenal adenoma. Electronically Signed   By: Abigail Miyamoto M.D.   On: 07/02/2016 20:44   Dg Abd Portable 1 View  Result Date: 07/02/2016 CLINICAL DATA:  NG tube placement. EXAM: PORTABLE ABDOMEN - 1 VIEW COMPARISON:  CT abdomen/ pelvis earlier this day FINDINGS: Tip and side port of the enteric tube below the diaphragm in the stomach. Gaseous gastric distention. Small bowel dilatation is partially included. Bibasilar atelectasis is seen. IMPRESSION: Tip and side port of the enteric tube below the diaphragm in the stomach. Electronically Signed   By: Jeb Levering M.D.   On: 07/02/2016 23:25   US Abdomen Limited Ruq  Result Date: 07/03/2016 CLINICAL DATA:  Elevated LFTs.  Initial encounter. EXAM: US ABDOMEN LIMITED - RIGHT UPPER QUADRANT COMPARISON:  CT of the abdomen and pelvis from 07/02/2016 FINDINGS: Gallbladder: Status post cholecystectomy.  No retained stones seen. Common bile duct: Diameter: 0.3 cm, within normal limits in caliber. Difficult to fully characterize due to overlying structures. Liver: No focal lesion identified. Within normal limits in parenchymal echogenicity. IMPRESSION: Status post cholecystectomy. No retained stones seen. Unremarkable ultrasound of the right upper quadrant. On further assessment of the recent CT of the abdomen and pelvis, there is soft  tissue inflammation tracking about the pancreatic head and second segment of the duodenum. Given clinical concern, this could reflect mild acute pancreatitis. There is no evidence of devascularization or pseudocyst formation on the recent prior CT. Note that the pancreas is typically not well assessed on ultrasound. Electronically Signed   By: Garald Balding M.D.   On: 07/03/2016 03:58     Scheduled Meds: . chlorhexidine  15 mL Mouth Rinse BID  . cloNIDine  0.1 mg Transdermal Q Tue  . enoxaparin (LOVENOX) injection  60 mg Subcutaneous Q24H  . famotidine (PEPCID) IV  20 mg Intravenous Q12H  . mouth rinse  15 mL Mouth Rinse q12n4p   Continuous Infusions: . sodium chloride 125 mL/hr at 07/03/16 1005  Marzetta Board, MD, PhD [facility] Pager [phone]  If 7PM-7AM, please contact night-coverage [email or web address] Password [email or web address] 07/03/2016, 2:09 PM

## 2016-07-03 NOTE — Progress Notes (Signed)
Admission nurse notified of new patient.  

## 2016-07-04 ENCOUNTER — Inpatient Hospital Stay (HOSPITAL_COMMUNITY): Payer: Medicare Other

## 2016-07-04 DIAGNOSIS — K8500 Idiopathic acute pancreatitis without necrosis or infection: Secondary | ICD-10-CM

## 2016-07-04 LAB — COMPREHENSIVE METABOLIC PANEL
ALT: 70 U/L — ABNORMAL HIGH (ref 14–54)
AST: 37 U/L (ref 15–41)
Albumin: 2.9 g/dL — ABNORMAL LOW (ref 3.5–5.0)
Alkaline Phosphatase: 80 U/L (ref 38–126)
Anion gap: 9 (ref 5–15)
BUN: 28 mg/dL — ABNORMAL HIGH (ref 6–20)
CO2: 23 mmol/L (ref 22–32)
Calcium: 8.5 mg/dL — ABNORMAL LOW (ref 8.9–10.3)
Chloride: 108 mmol/L (ref 101–111)
Creatinine, Ser: 0.98 mg/dL (ref 0.44–1.00)
GFR calc Af Amer: >60 mL/min (ref >60)
GFR calc non Af Amer: 57 mL/min — ABNORMAL LOW (ref >60)
Glucose, Bld: 105 mg/dL — ABNORMAL HIGH (ref 65–99)
Potassium: 3.7 mmol/L (ref 3.5–5.1)
Sodium: 140 mmol/L (ref 135–145)
Total Bilirubin: 1 mg/dL (ref 0.3–1.2)
Total Protein: 6.5 g/dL (ref 6.5–8.1)

## 2016-07-04 LAB — LIPASE, BLOOD: Lipase: 61 U/L — ABNORMAL HIGH (ref 11–51)

## 2016-07-04 LAB — CBC
HCT: 40.7 % (ref 36.0–46.0)
Hemoglobin: 13.5 g/dL (ref 12.0–15.0)
MCH: 30.3 pg (ref 26.0–34.0)
MCHC: 33.2 g/dL (ref 30.0–36.0)
MCV: 91.5 fL (ref 78.0–100.0)
Platelets: 276 10*3/uL (ref 150–400)
RBC: 4.45 MIL/uL (ref 3.87–5.11)
RDW: 16 % — ABNORMAL HIGH (ref 11.5–15.5)
WBC: 3.5 10*3/uL — ABNORMAL LOW (ref 4.0–10.5)

## 2016-07-04 MED ORDER — BISACODYL 10 MG RE SUPP: 10.0000 mg | Freq: Every day | RECTAL | Status: DC | PRN

## 2016-07-04 MED ORDER — BISACODYL 10 MG RE SUPP
10.0000 mg | Freq: Once | RECTAL | Status: AC
  Administered 2016-07-04: 10 mg via RECTAL
  Filled 2016-07-04: qty 1

## 2016-07-04 NOTE — Progress Notes (Addendum)
[facility] Progress Note     Subjective: C/o back and abdominal pain. Also c/o nausea. Patient is spitting up white mucous around her NG tube, usually occurs after an abdominal exam or with rolling over. NGT in good position according to previous XR. Denies flatus or BM. Last BM was 10/8.   NGT: 2,600 cc/24h; thick, light brown   Objective: Vital signs in last 24 hours: Temp:  [98.7 F (37.1 C)-99.6 F (37.6 C)] 99.4 F (37.4 C) (10/11 0302) Pulse Rate:  [114-118] 118 (10/11 0302) Resp:  [16] 16 (10/11 0302) BP: (128-150)/(79-85) 150/79 (10/11 0302) SpO2:  [92 %-94 %] 94 % (10/11 0302) Last BM Date: 07/02/16  Intake/Output from previous day: 10/10 0701 - 10/11 0700 In: 3200.8 [I.V.:3070.8; NG/GT:30; IV Piggyback:100] Out: 3100 [Urine:500; Emesis/NG output:2600] Intake/Output this shift: Total I/O In: 147.9 [I.V.:147.9] Out: -   PE: Gen:  Alert, cooperative - in obvious discomfort  Pulm:  CTA, no W/R/R Abd: Soft, TTP umbilicus and upper abdomen, mild distention, hypoactive BS, previous laparotomy scar, no peritonitis Ext:  No erythema, edema, or tenderness   Lab Results:   Recent Labs  07/03/16 0627 07/04/16 0318  WBC 4.5 3.5*  HGB 14.5 13.5  HCT 44.1 40.7  PLT 307 276   BMET  Recent Labs  07/03/16 0627 07/04/16 0318  NA 136 140  K 3.5 3.7  CL 102 108  CO2 24 23  GLUCOSE 138* 105*  BUN 21* 28*  CREATININE 0.87 0.98  CALCIUM 9.1 8.5*   PT/INR  Recent Labs  07/03/16 0627  LABPROT 14.0  INR 1.08   CMP     Component Value Date/Time   NA 140 07/04/2016 0318   NA 140 02/14/2016   K 3.7 07/04/2016 0318   CL 108 07/04/2016 0318   CO2 23 07/04/2016 0318   GLUCOSE 105 (H) 07/04/2016 0318   BUN 28 (H) 07/04/2016 0318   BUN 13 02/14/2016   CREATININE 0.98 07/04/2016 0318   CALCIUM 8.5 (L) 07/04/2016 0318   PROT 6.5 07/04/2016 0318   ALBUMIN 2.9 (L) 07/04/2016 0318   AST 37 07/04/2016 0318   ALT 70 (H) 07/04/2016 0318   ALKPHOS 80  07/04/2016 0318   BILITOT 1.0 07/04/2016 0318   GFRNONAA 57 (L) 07/04/2016 0318   GFRAA >60 07/04/2016 0318   Lipase     Component Value Date/Time   LIPASE 61 (H) 07/04/2016 0318   Studies/Results: Dg Chest 2 View  Result Date: 07/02/2016 CLINICAL DATA:  Mid lower chest and left sided chest pain up into left shoulder. Severe abd pain and tightness. Dizziness. N/V. SOB. Rapid heartbeat. Skin feels cold - almost clammy, but she states she is burning up. Symptoms have increased in severity and frequency over the last week. EXAM: CHEST  2 VIEW COMPARISON:  01/05/2016 FINDINGS: Lung volumes are low. There is linear/streaky type opacity at the lung bases there is most likely atelectasis. Infection is possible. Remainder of the lungs is clear. No pleural effusion or pneumothorax. Cardiac silhouette is normal in size. No mediastinal or hilar masses. No evidence of adenopathy. Skeletal structures are intact. IMPRESSION: 1. Lung base opacity accentuated by low lung volumes. This is most likely atelectasis. Consider pneumonia if there are consistent clinical symptoms. 2. No other evidence of acute cardiopulmonary disease. Electronically Signed   By: Lajean Manes M.D.   On: 07/02/2016 16:38   Ct Abdomen Pelvis W Contrast  Result Date: 07/02/2016 CLINICAL DATA:  Epigastric abdominal pain for 5 days.  Vomiting starting last night. Prior hysterectomy. Prior abdominal adhesion removal. EXAM: CT ABDOMEN AND PELVIS WITH CONTRAST TECHNIQUE: Multidetector CT imaging of the abdomen and pelvis was performed using the standard protocol following bolus administration of intravenous contrast. CONTRAST:  114mL ISOVUE-300 IOPAMIDOL (ISOVUE-300) INJECTION 61% COMPARISON:  01/30/2016 FINDINGS: Lower chest: Bibasilar atelectasis. Normal heart size without pericardial or pleural effusion. Fluid in the distal esophagus. Hepatobiliary: Normal liver. Cholecystectomy, without biliary ductal dilatation. Pancreas: Normal, without mass  or ductal dilatation. Spleen: Normal in size, without focal abnormality. Adrenals/Urinary Tract: Normal right adrenal gland. Left adrenal nodule measures 1.7 cm and is been similar in back to 06/23/2015, consistent with an adenoma on that study. Normal kidneys, without hydronephrosis. Normal urinary bladder. Stomach/Bowel: The stomach is distended and fluid-filled. The colon is normal to minimally decompressed distally. Decompressed terminal and distal ileum. Proximal and mid small bowel loops are dilated, including at 3.7 cm on image 56/series 2. Relatively gradual transition occurs, including within left paracentral anteriorly positioned small bowel loops on image 52/series 2. No obstructive mass. No findings to suggest complicating ischemia. Minimal nonspecific mesenteric interloop edema is seen including on image 56/series 2. Vascular/Lymphatic: Advanced aortic and branch vessel atherosclerosis. No abdominopelvic adenopathy. Reproductive: Hysterectomy.  No adnexal mass. Other: No significant free fluid. No pneumatosis or free intraperitoneal air. Musculoskeletal: Lumbosacral spine fixation. IMPRESSION: 1. Mid small bowel obstruction, likely secondary to adhesions. No complicating ischemia. 2. Distended, fluid-filled stomach with fluid in the esophagus. The patient would likely benefit from nasogastric tube placement. 3. Advanced aortic atherosclerosis. 4. Left adrenal adenoma. Electronically Signed   By: Abigail Miyamoto M.D.   On: 07/02/2016 20:44   Dg Abd Portable 1v-small Bowel Obstruction Protocol-24 Hr Delay  Result Date: 07/04/2016 CLINICAL DATA:  Delayed image for assessment of small bowel obstruction. EXAM: PORTABLE ABDOMEN - 1 VIEW COMPARISON:  07/03/2016 FINDINGS: Contrast injected into the stomach under previous exam is no longer visualized. It has passed. There is no evidence of small-bowel obstruction. Nasogastric tube is well positioned within the mid stomach. Bowel gas pattern is unremarkable.  IMPRESSION: 1. No evidence of a small-bowel obstruction. Electronically Signed   By: Lajean Manes M.D.   On: 07/04/2016 09:15   Dg Abd Portable 1v-small Bowel Obstruction Protocol-initial, 8 Hr Delay  Result Date: 07/03/2016 CLINICAL DATA:  70 year old female with small bowel obstruction. Subsequent encounter. EXAM: PORTABLE ABDOMEN - 1 VIEW COMPARISON:  07/02/2016 plain film and CT. FINDINGS: Nasogastric tube with tip gastric body level. Contrast and gas-filled dilated stomach. Gas-filled small bowel loops. The caliber of the small bowel loops is not adequately assessed by plain film examination (as small bowel loops may be more fluid-filled than gas-filled when compared to prior exam) and small bowel obstructive pattern may still be present. The possibility of free intraperitoneal air cannot be assessed on a supine view. Postsurgical changes lumbar spine. IMPRESSION: Contrast and gas-filled dilated stomach. Gas-filled small bowel loops. The caliber of the small bowel loops is not adequately assessed by plain film examination (as small bowel loops may be more fluid-filled than gas-filled when compared to prior exam) and small bowel obstructive pattern may still be present. Electronically Signed   By: Genia Del M.D.   On: 07/03/2016 15:51   Dg Abd Portable 1 View  Result Date: 07/02/2016 CLINICAL DATA:  NG tube placement. EXAM: PORTABLE ABDOMEN - 1 VIEW COMPARISON:  CT abdomen/ pelvis earlier this day FINDINGS: Tip and side port of the enteric tube below the diaphragm in the stomach. Gaseous  gastric distention. Small bowel dilatation is partially included. Bibasilar atelectasis is seen. IMPRESSION: Tip and side port of the enteric tube below the diaphragm in the stomach. Electronically Signed   By: Jeb Levering M.D.   On: 07/02/2016 23:25   US Abdomen Limited Ruq  Result Date: 07/03/2016 CLINICAL DATA:  Elevated LFTs.  Initial encounter. EXAM: US ABDOMEN LIMITED - RIGHT UPPER QUADRANT  COMPARISON:  CT of the abdomen and pelvis from 07/02/2016 FINDINGS: Gallbladder: Status post cholecystectomy.  No retained stones seen. Common bile duct: Diameter: 0.3 cm, within normal limits in caliber. Difficult to fully characterize due to overlying structures. Liver: No focal lesion identified. Within normal limits in parenchymal echogenicity. IMPRESSION: Status post cholecystectomy. No retained stones seen. Unremarkable ultrasound of the right upper quadrant. On further assessment of the recent CT of the abdomen and pelvis, there is soft tissue inflammation tracking about the pancreatic head and second segment of the duodenum. Given clinical concern, this could reflect mild acute pancreatitis. There is no evidence of devascularization or pseudocyst formation on the recent prior CT. Note that the pancreas is typically not well assessed on ultrasound. Electronically Signed   By: Garald Balding M.D.   On: 07/03/2016 03:58   Assessment/Plan SBO  - suspect secondary to intra-abdominal adhesions ([facility] hysterectomy, previous surgery for adhesive disease) - NGT placed 07/03/16, gastrografin administered per tube - delay films 10/10 - contrast in stomach, persistent obstruction - repeat films 10/11 - normal bowel gas pattern with no contrast visualized (passed vs suctioned to NG cannister?) - persistent pain and no return of bowel function  - NGT: 2,600/24h  Acute pancreatitis - lipase 3,627 on admission, down to 61 - no significant inflammatory changes appreciated by CT abd/pelvis  Plan: persistent nausea, abdominal pain, and high NGT output, despite improved abdominal films. Not having bowel function. Will give dulcolax suppository to help stimulate bowel function. Lipase improving, not extremely tender.   Possible NGT clamping trail this afternoon.    LOS: 2 days    Jill Alexanders , [facility] 07/04/2016, 9:29 AM Pager: [phone] Consults: [phone] Mon-Fri 7:00  am-4:30 pm Sat-Sun 7:00 am-11:30 am  I saw the patient, participated in the history, exam and medical decision making, and concur with the physician assistant's note above.  DELAYED NOTE ENTRY - SAW PT ON 10/11 AT AROUND 4:50 PM 2 female acquaintances at bs Reports ongoing abd discomfort, mainly generalized. No flatus. No BM Lipase down No real bowel dilation on plain films today Light brown output in NG  Alert cta Morbidly obese, old lower midline scar. Mild TTP throughout. No rebound/guarding  Pancreatitis psbo  Although plain films look better she clinically is unchanged. Still with hi NG tube output. Could she have ileus from pancreatitis. Nothing today on exam suggests need for operative intervention today.  Long discussion with pt and friends/relatives in room regarding psbo and ileus   For now, I would continue NG tube decompression and NOT clamp Bowel rest IVF Ambulate Repeat films Thursday If doesn't open in next day or so, will need repeat ct and possible operative intervention  Leighton Ruff. Redmond Pulling, MD, FACS General, Bariatric, & Minimally Invasive Surgery [facility], [place]

## 2016-07-04 NOTE — Progress Notes (Signed)
PROGRESS NOTE  Amy [name] [id number] DOB: Dec 27, 1945 DOA: 07/02/2016 PCP: Blanchie Serve, MD   LOS: 2 days   Brief Narrative: Amy [name] is a 70 y.o. woman with a history of HTN, GERD, DVT (she is not on anticoagulation currently), and chronic pain who presents to the ED for evaluation of abdominal pain x 5 five days.  She does not recall a specific trigger.  She is not sure that food has made it worse because she has had decreased PO intake.  Within the past 24 hours, she has developed nausea and vomiting. She was also found to have an elevated lipase, CT scan showed small bowel obstruction. General surgery was consulted and patient was admitted to medicine service  Assessment & Plan: Principal Problem:   SBO (small bowel obstruction) Active Problems:   Essential hypertension   Allergic rhinitis   GERD (gastroesophageal reflux disease)   Chronic pain   Pancreatitis   Small bowel obstruction - Possibly related to adhesions related to previous surgeries, she has had history of the SBO in the past requiring LOA  - Gen. surgery consulted, appreciate input, serial abdominal x-rays - Continue NG tube,: 2,600 cc/24h; thick, light brown continue nothing by mouth, continue IV fluids, analgesics and antiemetics,high NGT output Will give dulcolax suppository to help stimulate bowel function. Lipase improving, not extremely tender.  Acute pancreatitis - CT scan however shows no significant pancreatic changes, abdominal ultrasound with minimal soft tissue inflammation around the pancreatic head concerning for mild acute pancreatitis, significant elevation of lipase could also be related to #1 - Treatment is similar to #1, continue nothing by mouth, continue IV fluids and symptom control  HTN - continue home medications with clonidine patch, blood pressure controlled at 128/85 Will place  On tele   GERD - pepcid  Elevated LFTs - related to ?pancreatitis, she  is s/p cholecystectomy. LFs and lipase improving.  - CBD without dilatation so less likely choledocholithiasis, bilirubin is normal today   DVT prophylaxis: Lovenox Code Status: DNR Family Communication: no family bedside Disposition Plan: home when ready   Consultants:   General surgery  Procedures:   None   Antimicrobials:  None    Subjective:   C/o back and abdominal pain. Also c/o nausea, dry heaving    Objective: Vitals:   07/03/16 0436 07/03/16 1414 07/03/16 2253 07/04/16 0302  BP: (!) 153/80 128/85 (!) 148/82 (!) 150/79  Pulse: (!) 116 (!) 114 (!) 115 (!) 118  Resp: 18 16 16 16   Temp: 97.9 F (36.6 C) 98.7 F (37.1 C) 99.6 F (37.6 C) 99.4 F (37.4 C)  TempSrc: Oral Oral Oral   SpO2: 92% 92% 93% 94%  Weight:      Height:        Intake/Output Summary (Last 24 hours) at 07/04/16 1128 Last data filed at 07/04/16 0745  Gross per 24 hour  Intake          3318.75 ml  Output             3100 ml  Net           218.75 ml   Filed Weights   07/02/16 1500 07/03/16 0044  Weight: 117.9 kg (260 lb) 123.8 kg (273 lb)    Examination: Constitutional: NAD Vitals:   07/03/16 0436 07/03/16 1414 07/03/16 2253 07/04/16 0302  BP: (!) 153/80 128/85 (!) 148/82 (!) 150/79  Pulse: (!) 116 (!) 114 (!) 115 (!) 118  Resp: 18 16 16  16  Temp: 97.9 F (36.6 C) 98.7 F (37.1 C) 99.6 F (37.6 C) 99.4 F (37.4 C)  TempSrc: Oral Oral Oral   SpO2: 92% 92% 93% 94%  Weight:      Height:       Eyes: PERRL, lids and conjunctivae normal ENMT: Mucous membranes are dry Neck: normal, supple, no masses, no thyromegaly Respiratory: clear to auscultation bilaterally, no wheezing, no crackles.  Cardiovascular: Regular rate and rhythm, no murmurs / rubs / gallops.  Abdomen: abdomen with tenderness throughout. No BS Musculoskeletal: no clubbing / cyanosis.  Skin: no rashes, lesions, ulcers. No induration Neurologic: non focal    Data Reviewed: I have personally reviewed following  labs and imaging studies  CBC:  Recent Labs Lab 07/02/16 1650 07/03/16 0627 07/04/16 0318  WBC 10.1 4.5 3.5*  NEUTROABS 8.2* 3.0  --   HGB 15.4* 14.5 13.5  HCT 45.4 44.1 40.7  MCV 91.3 91.9 91.5  PLT 323 307 [id number]   Basic Metabolic Panel:  Recent Labs Lab 06/27/16 1823 07/02/16 1650 07/03/16 0627 07/04/16 0318  NA  --  134* 136 140  K  --  3.7 3.5 3.7  CL  --  100* 102 108  CO2  --  20* 24 23  GLUCOSE  --  160* 138* 105*  BUN  --  23* 21* 28*  CREATININE  --  0.87 0.87 0.98  CALCIUM  --  9.9 9.1 8.5*  MG 2.5*  --   --   --    GFR: Estimated Creatinine Clearance: 70.6 mL/min (by C-G formula based on SCr of 0.98 mg/dL). Liver Function Tests:  Recent Labs Lab 07/02/16 1650 07/03/16 0627 07/04/16 0318  AST 210* 72* 37  ALT 149* 111* 70*  ALKPHOS 113 94 80  BILITOT 1.8* 1.1 1.0  PROT 8.3* 7.2 6.5  ALBUMIN 4.1 3.5 2.9*    Recent Labs Lab 07/02/16 1650 07/03/16 0627 07/04/16 0318  LIPASE 3,627* 627* 61*   No results for input(s): AMMONIA in the last 168 hours. Coagulation Profile:  Recent Labs Lab 07/03/16 0627  INR 1.08   Lipid Profile:  Recent Labs  07/03/16 0627  CHOL 165  HDL 82  LDLCALC 75  TRIG 39  CHOLHDL 2.0   Urine analysis:    Component Value Date/Time   COLORURINE AMBER (A) 07/03/2016 0721   APPEARANCEUR CLEAR 07/03/2016 0721   LABSPEC >1.046 (H) 07/03/2016 0721   PHURINE 6.0 07/03/2016 0721   GLUCOSEU NEGATIVE 07/03/2016 0721   HGBUR NEGATIVE 07/03/2016 0721   BILIRUBINUR SMALL (A) 07/03/2016 0721   KETONESUR NEGATIVE 07/03/2016 0721   PROTEINUR 100 (A) 07/03/2016 0721   UROBILINOGEN 1.0 04/21/2014 0756   NITRITE NEGATIVE 07/03/2016 0721   LEUKOCYTESUR NEGATIVE 07/03/2016 0721   Radiology Studies: Dg Chest 2 View  Result Date: 07/02/2016 CLINICAL DATA:  Mid lower chest and left sided chest pain up into left shoulder. Severe abd pain and tightness. Dizziness. N/V. SOB. Rapid heartbeat. Skin feels cold - almost clammy,  but she states she is burning up. Symptoms have increased in severity and frequency over the last week. EXAM: CHEST  2 VIEW COMPARISON:  01/05/2016 FINDINGS: Lung volumes are low. There is linear/streaky type opacity at the lung bases there is most likely atelectasis. Infection is possible. Remainder of the lungs is clear. No pleural effusion or pneumothorax. Cardiac silhouette is normal in size. No mediastinal or hilar masses. No evidence of adenopathy. Skeletal structures are intact. IMPRESSION: 1. Lung base opacity accentuated by low lung  volumes. This is most likely atelectasis. Consider pneumonia if there are consistent clinical symptoms. 2. No other evidence of acute cardiopulmonary disease. Electronically Signed   By: Lajean Manes M.D.   On: 07/02/2016 16:38   Ct Abdomen Pelvis W Contrast  Result Date: 07/02/2016 CLINICAL DATA:  Epigastric abdominal pain for 5 days. Vomiting starting last night. Prior hysterectomy. Prior abdominal adhesion removal. EXAM: CT ABDOMEN AND PELVIS WITH CONTRAST TECHNIQUE: Multidetector CT imaging of the abdomen and pelvis was performed using the standard protocol following bolus administration of intravenous contrast. CONTRAST:  135mL ISOVUE-300 IOPAMIDOL (ISOVUE-300) INJECTION 61% COMPARISON:  01/30/2016 FINDINGS: Lower chest: Bibasilar atelectasis. Normal heart size without pericardial or pleural effusion. Fluid in the distal esophagus. Hepatobiliary: Normal liver. Cholecystectomy, without biliary ductal dilatation. Pancreas: Normal, without mass or ductal dilatation. Spleen: Normal in size, without focal abnormality. Adrenals/Urinary Tract: Normal right adrenal gland. Left adrenal nodule measures 1.7 cm and is been similar in back to 06/23/2015, consistent with an adenoma on that study. Normal kidneys, without hydronephrosis. Normal urinary bladder. Stomach/Bowel: The stomach is distended and fluid-filled. The colon is normal to minimally decompressed distally.  Decompressed terminal and distal ileum. Proximal and mid small bowel loops are dilated, including at 3.7 cm on image 56/series 2. Relatively gradual transition occurs, including within left paracentral anteriorly positioned small bowel loops on image 52/series 2. No obstructive mass. No findings to suggest complicating ischemia. Minimal nonspecific mesenteric interloop edema is seen including on image 56/series 2. Vascular/Lymphatic: Advanced aortic and branch vessel atherosclerosis. No abdominopelvic adenopathy. Reproductive: Hysterectomy.  No adnexal mass. Other: No significant free fluid. No pneumatosis or free intraperitoneal air. Musculoskeletal: Lumbosacral spine fixation. IMPRESSION: 1. Mid small bowel obstruction, likely secondary to adhesions. No complicating ischemia. 2. Distended, fluid-filled stomach with fluid in the esophagus. The patient would likely benefit from nasogastric tube placement. 3. Advanced aortic atherosclerosis. 4. Left adrenal adenoma. Electronically Signed   By: Abigail Miyamoto M.D.   On: 07/02/2016 20:44   Dg Abd Portable 1v-small Bowel Obstruction Protocol-24 Hr Delay  Result Date: 07/04/2016 CLINICAL DATA:  Delayed image for assessment of small bowel obstruction. EXAM: PORTABLE ABDOMEN - 1 VIEW COMPARISON:  07/03/2016 FINDINGS: Contrast injected into the stomach under previous exam is no longer visualized. It has passed. There is no evidence of small-bowel obstruction. Nasogastric tube is well positioned within the mid stomach. Bowel gas pattern is unremarkable. IMPRESSION: 1. No evidence of a small-bowel obstruction. Electronically Signed   By: Lajean Manes M.D.   On: 07/04/2016 09:15   Dg Abd Portable 1v-small Bowel Obstruction Protocol-initial, 8 Hr Delay  Result Date: 07/03/2016 CLINICAL DATA:  70 year old female with small bowel obstruction. Subsequent encounter. EXAM: PORTABLE ABDOMEN - 1 VIEW COMPARISON:  07/02/2016 plain film and CT. FINDINGS: Nasogastric tube with  tip gastric body level. Contrast and gas-filled dilated stomach. Gas-filled small bowel loops. The caliber of the small bowel loops is not adequately assessed by plain film examination (as small bowel loops may be more fluid-filled than gas-filled when compared to prior exam) and small bowel obstructive pattern may still be present. The possibility of free intraperitoneal air cannot be assessed on a supine view. Postsurgical changes lumbar spine. IMPRESSION: Contrast and gas-filled dilated stomach. Gas-filled small bowel loops. The caliber of the small bowel loops is not adequately assessed by plain film examination (as small bowel loops may be more fluid-filled than gas-filled when compared to prior exam) and small bowel obstructive pattern may still be present. Electronically Signed   By:  Genia Del M.D.   On: 07/03/2016 15:51   Dg Abd Portable 1 View  Result Date: 07/02/2016 CLINICAL DATA:  NG tube placement. EXAM: PORTABLE ABDOMEN - 1 VIEW COMPARISON:  CT abdomen/ pelvis earlier this day FINDINGS: Tip and side port of the enteric tube below the diaphragm in the stomach. Gaseous gastric distention. Small bowel dilatation is partially included. Bibasilar atelectasis is seen. IMPRESSION: Tip and side port of the enteric tube below the diaphragm in the stomach. Electronically Signed   By: Jeb Levering M.D.   On: 07/02/2016 23:25   US Abdomen Limited Ruq  Result Date: 07/03/2016 CLINICAL DATA:  Elevated LFTs.  Initial encounter. EXAM: US ABDOMEN LIMITED - RIGHT UPPER QUADRANT COMPARISON:  CT of the abdomen and pelvis from 07/02/2016 FINDINGS: Gallbladder: Status post cholecystectomy.  No retained stones seen. Common bile duct: Diameter: 0.3 cm, within normal limits in caliber. Difficult to fully characterize due to overlying structures. Liver: No focal lesion identified. Within normal limits in parenchymal echogenicity. IMPRESSION: Status post cholecystectomy. No retained stones seen. Unremarkable  ultrasound of the right upper quadrant. On further assessment of the recent CT of the abdomen and pelvis, there is soft tissue inflammation tracking about the pancreatic head and second segment of the duodenum. Given clinical concern, this could reflect mild acute pancreatitis. There is no evidence of devascularization or pseudocyst formation on the recent prior CT. Note that the pancreas is typically not well assessed on ultrasound. Electronically Signed   By: Garald Balding M.D.   On: 07/03/2016 03:58     Scheduled Meds: . chlorhexidine  15 mL Mouth Rinse BID  . cloNIDine  0.1 mg Transdermal Q Tue  . enoxaparin (LOVENOX) injection  60 mg Subcutaneous Q24H  . famotidine (PEPCID) IV  20 mg Intravenous Q12H  . mouth rinse  15 mL Mouth Rinse q12n4p   Continuous Infusions: . sodium chloride 125 mL/hr at 07/04/16 0906     [facility] Pager [phone]  If 7PM-7AM, please contact night-coverage [email or web address] Password [email or web address] 07/04/2016, 11:28 AM

## 2016-07-05 ENCOUNTER — Inpatient Hospital Stay (HOSPITAL_COMMUNITY): Payer: Medicare Other

## 2016-07-05 LAB — COMPREHENSIVE METABOLIC PANEL
ALT: 50 U/L (ref 14–54)
AST: 27 U/L (ref 15–41)
Albumin: 2.8 g/dL — ABNORMAL LOW (ref 3.5–5.0)
Alkaline Phosphatase: 73 U/L (ref 38–126)
Anion gap: 7 (ref 5–15)
BUN: 27 mg/dL — ABNORMAL HIGH (ref 6–20)
CO2: 21 mmol/L — ABNORMAL LOW (ref 22–32)
Calcium: 8.6 mg/dL — ABNORMAL LOW (ref 8.9–10.3)
Chloride: 114 mmol/L — ABNORMAL HIGH (ref 101–111)
Creatinine, Ser: 0.93 mg/dL (ref 0.44–1.00)
GFR calc Af Amer: >60 mL/min (ref >60)
GFR calc non Af Amer: >60 mL/min (ref >60)
Glucose, Bld: 92 mg/dL (ref 65–99)
Potassium: 3.9 mmol/L (ref 3.5–5.1)
Sodium: 142 mmol/L (ref 135–145)
Total Bilirubin: 0.9 mg/dL (ref 0.3–1.2)
Total Protein: 6.6 g/dL (ref 6.5–8.1)

## 2016-07-05 LAB — CBC
HCT: 39 % (ref 36.0–46.0)
Hemoglobin: 12.8 g/dL (ref 12.0–15.0)
MCH: 30.3 pg (ref 26.0–34.0)
MCHC: 32.8 g/dL (ref 30.0–36.0)
MCV: 92.4 fL (ref 78.0–100.0)
Platelets: 239 10*3/uL (ref 150–400)
RBC: 4.22 MIL/uL (ref 3.87–5.11)
RDW: 16.2 % — ABNORMAL HIGH (ref 11.5–15.5)
WBC: 4.6 10*3/uL (ref 4.0–10.5)

## 2016-07-05 LAB — COMPLIANCE DRUG ANALYSIS, UR

## 2016-07-05 MED ORDER — MORPHINE SULFATE (PF) 2 MG/ML IV SOLN
2.0000 mg | INTRAVENOUS | Status: DC | PRN
  Administered 2016-07-05 – 2016-07-06 (×3): 2 mg via INTRAVENOUS
  Filled 2016-07-05 (×3): qty 1

## 2016-07-05 MED ORDER — IOPAMIDOL (ISOVUE-300) INJECTION 61%
INTRAVENOUS | Status: AC
  Administered 2016-07-05: 100 mL
  Filled 2016-07-05: qty 100

## 2016-07-05 MED ORDER — MENTHOL 3 MG MT LOZG
1.0000 | LOZENGE | OROMUCOSAL | Status: DC | PRN
  Administered 2016-07-05 – 2016-07-11 (×2): 3 mg via ORAL
  Filled 2016-07-05 (×3): qty 9

## 2016-07-05 MED ORDER — IOPAMIDOL (ISOVUE-300) INJECTION 61%
15.0000 mL | INTRAVENOUS | Status: AC
  Administered 2016-07-05 (×2): 15 mL via ORAL

## 2016-07-05 NOTE — Progress Notes (Signed)
PROGRESS NOTE  Amy [name] [id number] DOB: 14-Apr-1946 DOA: 07/02/2016 PCP: Blanchie Serve, MD   LOS: 3 days   Brief Narrative: Amy [name] is a 70 y.o. woman with a history of HTN, GERD, DVT (she is not on anticoagulation currently), and chronic pain who presents to the ED for evaluation of abdominal pain x 5 five days.  She does not recall a specific trigger.  She is not sure that food has made it worse because she has had decreased PO intake.  Within the past 24 hours, she has developed nausea and vomiting. She was also found to have an elevated lipase, CT scan showed small bowel obstruction. General surgery was consulted and patient was admitted to medicine service  Assessment & Plan: Principal Problem:   SBO (small bowel obstruction) Active Problems:   Essential hypertension   Allergic rhinitis   GERD (gastroesophageal reflux disease)   Chronic pain   Pancreatitis   Small bowel obstruction - Possibly related to adhesions related to previous surgeries, she has had history of the SBO in the past requiring LOA  - Gen. surgery consulted, appreciate input, high NGT output: 3,550cc /24h  - Continue  nothing by mouth, continue IV fluids, analgesics and antiemetics,high NGT output Will give dulcolax suppository to help stimulate bowel function. Lipase improving, still very nauseous ,agree with repeat CT abdomen ,pelvis to evaluate the pancreas and slow improvement,in terms of her GI funstion, high NGT outptu  Acute pancreatitis -initial  CT scan however shows no significant pancreatic changes, abdominal ultrasound with minimal soft tissue inflammation around the pancreatic head concerning for mild acute pancreatitis, significant elevation of lipase could also be related to #1 - Treatment is similar to #1,    HTN - continue home medications with clonidine patch, blood pressure controlled at 128/85 Will place  On tele   GERD - pepcid  Elevated LFTs -  related to ?pancreatitis, she is s/p cholecystectomy. LFs and lipase improving.  - CBD without dilatation so less likely choledocholithiasis, bilirubin is normal today   DVT prophylaxis: Lovenox Code Status: DNR Family Communication: no family bedside Disposition Plan: home when ready   Consultants:   General surgery  Procedures:   None   Antimicrobials:  None    Subjective:     Not feeling well. Reports continued generalized abdominal pain rated about 6/10  Objective: Vitals:   07/04/16 1351 07/04/16 2115 07/05/16 0534 07/05/16 0900  BP: (!) 156/77 140/81 (!) 155/78 135/76  Pulse: (!) 110 (!) 108 (!) 105 (!) 102  Resp: 18 18 20 20   Temp: 98.6 F (37 C) 98.3 F (36.8 C) 98.7 F (37.1 C) 99.1 F (37.3 C)  TempSrc: Oral Oral Oral Oral  SpO2: 95% 95% 95% 98%  Weight:      Height:        Intake/Output Summary (Last 24 hours) at 07/05/16 1139 Last data filed at 07/05/16 1100  Gross per 24 hour  Intake          4013.33 ml  Output             6000 ml  Net         -1986.67 ml   Filed Weights   07/02/16 1500 07/03/16 0044  Weight: 117.9 kg (260 lb) 123.8 kg (273 lb)    Examination: Constitutional: NAD Vitals:   07/04/16 1351 07/04/16 2115 07/05/16 0534 07/05/16 0900  BP: (!) 156/77 140/81 (!) 155/78 135/76  Pulse: (!) 110 (!) 108 (!) 105 (!) 102  Resp: 18 18 20 20   Temp: 98.6 F (37 C) 98.3 F (36.8 C) 98.7 F (37.1 C) 99.1 F (37.3 C)  TempSrc: Oral Oral Oral Oral  SpO2: 95% 95% 95% 98%  Weight:      Height:       Eyes: PERRL, lids and conjunctivae normal ENMT: Mucous membranes are dry Neck: normal, supple, no masses, no thyromegaly Respiratory: clear to auscultation bilaterally, no wheezing, no crackles.  Cardiovascular: Regular rate and rhythm, no murmurs / rubs / gallops.  Abdomen: abdomen with tenderness throughout. No BS Musculoskeletal: no clubbing / cyanosis.  Skin: no rashes, lesions, ulcers. No induration Neurologic: non focal     Data Reviewed: I have personally reviewed following labs and imaging studies  CBC:  Recent Labs Lab 07/02/16 1650 07/03/16 0627 07/04/16 0318 07/05/16 0532  WBC 10.1 4.5 3.5* 4.6  NEUTROABS 8.2* 3.0  --   --   HGB 15.4* 14.5 13.5 12.8  HCT 45.4 44.1 40.7 39.0  MCV 91.3 91.9 91.5 92.4  PLT 323 307 276 [id number]   Basic Metabolic Panel:  Recent Labs Lab 07/02/16 1650 07/03/16 0627 07/04/16 0318 07/05/16 0532  NA 134* 136 140 142  K 3.7 3.5 3.7 3.9  CL 100* 102 108 114*  CO2 20* 24 23 21*  GLUCOSE 160* 138* 105* 92  BUN 23* 21* 28* 27*  CREATININE 0.87 0.87 0.98 0.93  CALCIUM 9.9 9.1 8.5* 8.6*   GFR: Estimated Creatinine Clearance: 74.4 mL/min (by C-G formula based on SCr of 0.93 mg/dL). Liver Function Tests:  Recent Labs Lab 07/02/16 1650 07/03/16 0627 07/04/16 0318 07/05/16 0532  AST 210* 72* 37 27  ALT 149* 111* 70* 50  ALKPHOS 113 94 80 73  BILITOT 1.8* 1.1 1.0 0.9  PROT 8.3* 7.2 6.5 6.6  ALBUMIN 4.1 3.5 2.9* 2.8*    Recent Labs Lab 07/02/16 1650 07/03/16 0627 07/04/16 0318  LIPASE 3,627* 627* 61*   No results for input(s): AMMONIA in the last 168 hours. Coagulation Profile:  Recent Labs Lab 07/03/16 0627  INR 1.08   Lipid Profile:  Recent Labs  07/03/16 0627  CHOL 165  HDL 82  LDLCALC 75  TRIG 39  CHOLHDL 2.0   Urine analysis:    Component Value Date/Time   COLORURINE AMBER (A) 07/03/2016 0721   APPEARANCEUR CLEAR 07/03/2016 0721   LABSPEC >1.046 (H) 07/03/2016 0721   PHURINE 6.0 07/03/2016 0721   GLUCOSEU NEGATIVE 07/03/2016 0721   HGBUR NEGATIVE 07/03/2016 0721   BILIRUBINUR SMALL (A) 07/03/2016 0721   KETONESUR NEGATIVE 07/03/2016 0721   PROTEINUR 100 (A) 07/03/2016 0721   UROBILINOGEN 1.0 04/21/2014 0756   NITRITE NEGATIVE 07/03/2016 0721   LEUKOCYTESUR NEGATIVE 07/03/2016 0721   Radiology Studies: Dg Abd Portable 1v  Result Date: 07/05/2016 CLINICAL DATA:  Small-bowel obstruction. EXAM: PORTABLE ABDOMEN - 1 VIEW  COMPARISON:  07/04/2016 FINDINGS: NG tube in the stomach. Stomach is decompressed. No dilated loops of bowel identified. No bowel wall edema. Lumbar fusion hardware unchanged. IMPRESSION: Nonobstructive bowel gas pattern.  NG tube remains in the stomach. Electronically Signed   By: Franchot Gallo M.D.   On: 07/05/2016 08:43   Dg Abd Portable 1v-small Bowel Obstruction Protocol-24 Hr Delay  Result Date: 07/04/2016 CLINICAL DATA:  Delayed image for assessment of small bowel obstruction. EXAM: PORTABLE ABDOMEN - 1 VIEW COMPARISON:  07/03/2016 FINDINGS: Contrast injected into the stomach under previous exam is no longer visualized. It has passed. There is no evidence of small-bowel obstruction. Nasogastric  tube is well positioned within the mid stomach. Bowel gas pattern is unremarkable. IMPRESSION: 1. No evidence of a small-bowel obstruction. Electronically Signed   By: Lajean Manes M.D.   On: 07/04/2016 09:15   Dg Abd Portable 1v-small Bowel Obstruction Protocol-initial, 8 Hr Delay  Result Date: 07/03/2016 CLINICAL DATA:  70 year old female with small bowel obstruction. Subsequent encounter. EXAM: PORTABLE ABDOMEN - 1 VIEW COMPARISON:  07/02/2016 plain film and CT. FINDINGS: Nasogastric tube with tip gastric body level. Contrast and gas-filled dilated stomach. Gas-filled small bowel loops. The caliber of the small bowel loops is not adequately assessed by plain film examination (as small bowel loops may be more fluid-filled than gas-filled when compared to prior exam) and small bowel obstructive pattern may still be present. The possibility of free intraperitoneal air cannot be assessed on a supine view. Postsurgical changes lumbar spine. IMPRESSION: Contrast and gas-filled dilated stomach. Gas-filled small bowel loops. The caliber of the small bowel loops is not adequately assessed by plain film examination (as small bowel loops may be more fluid-filled than gas-filled when compared to prior exam) and  small bowel obstructive pattern may still be present. Electronically Signed   By: Genia Del M.D.   On: 07/03/2016 15:51     Scheduled Meds: . chlorhexidine  15 mL Mouth Rinse BID  . cloNIDine  0.1 mg Transdermal Q Tue  . enoxaparin (LOVENOX) injection  60 mg Subcutaneous Q24H  . famotidine (PEPCID) IV  20 mg Intravenous Q12H  . iopamidol  15 mL Oral Q1 Hr x 2  . mouth rinse  15 mL Mouth Rinse q12n4p   Continuous Infusions: . sodium chloride 125 mL/hr at 07/05/16 0827     [facility] Pager [phone]  If 7PM-7AM, please contact night-coverage [email or web address] Password [email or web address] 07/05/2016, 11:39 AM

## 2016-07-05 NOTE — Progress Notes (Signed)
[facility] Progress Note     Subjective: Not feeling well. Reports continued generalized abdominal pain rated about 6/10. Denies flatus or BM. Unable to ambulate yesterday secondary to pain.  Did have one BM yesterday after dulcolax suppository - per nurse it was "old" and formed.   Objective: Vital signs in last 24 hours: Temp:  [98.3 F (36.8 C)-98.7 F (37.1 C)] 98.7 F (37.1 C) (10/12 0534) Pulse Rate:  [105-110] 105 (10/12 0534) Resp:  [18-20] 20 (10/12 0534) BP: (140-156)/(77-81) 155/78 (10/12 0534) SpO2:  [95 %] 95 % (10/12 0534) Last BM Date: 07/04/16  Intake/Output from previous day: 10/11 0701 - 10/12 0700 In: 3034.2 [P.O.:60; I.V.:2804.2; NG/GT:120; IV Piggyback:50] Out: 4900 [Urine:1350; Emesis/NG output:3550] Intake/Output this shift: No intake/output data recorded.  PE: Gen:  Alert, uncomfortable, cooperative Card:  RRR, no M/G/R appreciated Pulm:  CTA, no W/R/R Abd: Soft, TTP lower abdomin and epigastrium with guarding, ND,hypoactive BS, previous laparotomy scar. Ext: pedal pulses 2+ BL  Lab Results:   Recent Labs  07/04/16 0318 07/05/16 0532  WBC 3.5* 4.6  HGB 13.5 12.8  HCT 40.7 39.0  PLT 276 239   BMET  Recent Labs  07/04/16 0318 07/05/16 0532  NA 140 142  K 3.7 3.9  CL 108 114*  CO2 23 21*  GLUCOSE 105* 92  BUN 28* 27*  CREATININE 0.98 0.93  CALCIUM 8.5* 8.6*   PT/INR  Recent Labs  07/03/16 0627  LABPROT 14.0  INR 1.08   CMP     Component Value Date/Time   NA 142 07/05/2016 0532   NA 140 02/14/2016   K 3.9 07/05/2016 0532   CL 114 (H) 07/05/2016 0532   CO2 21 (L) 07/05/2016 0532   GLUCOSE 92 07/05/2016 0532   BUN 27 (H) 07/05/2016 0532   BUN 13 02/14/2016   CREATININE 0.93 07/05/2016 0532   CALCIUM 8.6 (L) 07/05/2016 0532   PROT 6.6 07/05/2016 0532   ALBUMIN 2.8 (L) 07/05/2016 0532   AST 27 07/05/2016 0532   ALT 50 07/05/2016 0532   ALKPHOS 73 07/05/2016 0532   BILITOT 0.9 07/05/2016 0532    GFRNONAA >60 07/05/2016 0532   GFRAA >60 07/05/2016 0532   Lipase     Component Value Date/Time   LIPASE 61 (H) 07/04/2016 0318   Studies/Results: Dg Abd Portable 1v-small Bowel Obstruction Protocol-24 Hr Delay  Result Date: 07/04/2016 CLINICAL DATA:  Delayed image for assessment of small bowel obstruction. EXAM: PORTABLE ABDOMEN - 1 VIEW COMPARISON:  07/03/2016 FINDINGS: Contrast injected into the stomach under previous exam is no longer visualized. It has passed. There is no evidence of small-bowel obstruction. Nasogastric tube is well positioned within the mid stomach. Bowel gas pattern is unremarkable. IMPRESSION: 1. No evidence of a small-bowel obstruction. Electronically Signed   By: Lajean Manes M.D.   On: 07/04/2016 09:15   Dg Abd Portable 1v-small Bowel Obstruction Protocol-initial, 8 Hr Delay  Result Date: 07/03/2016 CLINICAL DATA:  70 year old female with small bowel obstruction. Subsequent encounter. EXAM: PORTABLE ABDOMEN - 1 VIEW COMPARISON:  07/02/2016 plain film and CT. FINDINGS: Nasogastric tube with tip gastric body level. Contrast and gas-filled dilated stomach. Gas-filled small bowel loops. The caliber of the small bowel loops is not adequately assessed by plain film examination (as small bowel loops may be more fluid-filled than gas-filled when compared to prior exam) and small bowel obstructive pattern may still be present. The possibility of free intraperitoneal air cannot be assessed on a supine view. Postsurgical changes  lumbar spine. IMPRESSION: Contrast and gas-filled dilated stomach. Gas-filled small bowel loops. The caliber of the small bowel loops is not adequately assessed by plain film examination (as small bowel loops may be more fluid-filled than gas-filled when compared to prior exam) and small bowel obstructive pattern may still be present. Electronically Signed   By: Genia Del M.D.   On: 07/03/2016 15:51    Anti-infectives: Anti-infectives    None      Assessment/Plan SBO  - suspect secondary to intra-abdominal adhesions (PMH hysterectomy, previous surgery for adhesive disease) - NGT placed 07/03/16, gastrografin administered per tube - delay films 10/10 - contrast in stomach, persistent obstruction - repeat films 10/11 - normal bowel gas pattern with no contrast visualized (passed vs suctioned to NG cannister?) - persistent pain and no return of bowel function  - high NGT output: 3,550cc /24h   Acute pancreatitis - lipase 3,627 on admission, down to 61 - no significant inflammatory changes appreciated by CT abd/pelvis 07/02/16   Plan: persistent nausea, abdominal pain, and high NGT output, despite improved abdominal films. Some mild tachycardia. Not having bowel function. Repeat abdominal CT with po and IV contrast.      LOS: 3 days    Jill Alexanders , [facility] 07/05/2016, 8:14 AM Pager: [phone] Consults: [phone] Mon-Fri 7:00 am-4:30 pm Sat-Sun 7:00 am-11:30 am

## 2016-07-06 ENCOUNTER — Inpatient Hospital Stay (HOSPITAL_COMMUNITY): Payer: Medicare Other | Admitting: Certified Registered"

## 2016-07-06 ENCOUNTER — Encounter (HOSPITAL_COMMUNITY)
Admission: EM | Disposition: A | Discharge status: Skilled Nursing Facility | Payer: Self-pay | Source: Home / Self Care | Attending: Internal Medicine

## 2016-07-06 ENCOUNTER — Encounter (HOSPITAL_COMMUNITY): Payer: Self-pay | Admitting: Certified Registered"

## 2016-07-06 DIAGNOSIS — K5652 Intestinal adhesions [bands] with complete obstruction: Secondary | ICD-10-CM

## 2016-07-06 HISTORY — PX: LAPAROSCOPY: SHX197

## 2016-07-06 HISTORY — PX: LAPAROTOMY: SHX154

## 2016-07-06 LAB — COMPREHENSIVE METABOLIC PANEL
ALT: 40 U/L (ref 14–54)
AST: 27 U/L (ref 15–41)
Albumin: 2.8 g/dL — ABNORMAL LOW (ref 3.5–5.0)
Alkaline Phosphatase: 72 U/L (ref 38–126)
Anion gap: 8 (ref 5–15)
BUN: 20 mg/dL (ref 6–20)
CO2: 21 mmol/L — ABNORMAL LOW (ref 22–32)
Calcium: 8.7 mg/dL — ABNORMAL LOW (ref 8.9–10.3)
Chloride: 114 mmol/L — ABNORMAL HIGH (ref 101–111)
Creatinine, Ser: 0.88 mg/dL (ref 0.44–1.00)
GFR calc Af Amer: >60 mL/min (ref >60)
GFR calc non Af Amer: >60 mL/min (ref >60)
Glucose, Bld: 82 mg/dL (ref 65–99)
Potassium: 3.6 mmol/L (ref 3.5–5.1)
Sodium: 143 mmol/L (ref 135–145)
Total Bilirubin: 0.6 mg/dL (ref 0.3–1.2)
Total Protein: 6.6 g/dL (ref 6.5–8.1)

## 2016-07-06 LAB — CBC
HCT: 38.8 % (ref 36.0–46.0)
Hemoglobin: 12.7 g/dL (ref 12.0–15.0)
MCH: 30 pg (ref 26.0–34.0)
MCHC: 32.7 g/dL (ref 30.0–36.0)
MCV: 91.7 fL (ref 78.0–100.0)
Platelets: 252 10*3/uL (ref 150–400)
RBC: 4.23 MIL/uL (ref 3.87–5.11)
RDW: 15.9 % — ABNORMAL HIGH (ref 11.5–15.5)
WBC: 5.9 10*3/uL (ref 4.0–10.5)

## 2016-07-06 SURGERY — LAPAROSCOPY, DIAGNOSTIC
Anesthesia: General | Site: Abdomen

## 2016-07-06 MED ORDER — FENTANYL CITRATE (PF) 100 MCG/2ML IJ SOLN
INTRAMUSCULAR | Status: AC
  Filled 2016-07-06: qty 2

## 2016-07-06 MED ORDER — DIPHENHYDRAMINE HCL 12.5 MG/5ML PO ELIX: 12.5000 mg | ORAL_SOLUTION | Freq: Four times a day (QID) | ORAL | Status: DC | PRN

## 2016-07-06 MED ORDER — LACTATED RINGERS IV SOLN
INTRAVENOUS | Status: DC
  Administered 2016-07-06 (×3): via INTRAVENOUS

## 2016-07-06 MED ORDER — PROPOFOL 10 MG/ML IV BOLUS
INTRAVENOUS | Status: DC | PRN
  Administered 2016-07-06: 200 mg via INTRAVENOUS

## 2016-07-06 MED ORDER — ESMOLOL HCL 100 MG/10ML IV SOLN
INTRAVENOUS | Status: DC | PRN
  Administered 2016-07-06 (×2): 20 mg via INTRAVENOUS

## 2016-07-06 MED ORDER — ENOXAPARIN SODIUM 60 MG/0.6ML ~~LOC~~ SOLN
60.0000 mg | SUBCUTANEOUS | Status: DC
  Administered 2016-07-07 – 2016-07-16 (×10): 60 mg via SUBCUTANEOUS
  Filled 2016-07-06 (×10): qty 0.6

## 2016-07-06 MED ORDER — LABETALOL HCL 5 MG/ML IV SOLN
INTRAVENOUS | Status: AC
  Filled 2016-07-06: qty 4

## 2016-07-06 MED ORDER — HYDROMORPHONE HCL 1 MG/ML IJ SOLN
INTRAMUSCULAR | Status: AC
  Filled 2016-07-06: qty 1

## 2016-07-06 MED ORDER — PROPOFOL 10 MG/ML IV BOLUS
INTRAVENOUS | Status: AC
  Filled 2016-07-06: qty 20

## 2016-07-06 MED ORDER — NALOXONE HCL 0.4 MG/ML IJ SOLN: 0.4000 mg | INTRAMUSCULAR | Status: DC | PRN

## 2016-07-06 MED ORDER — ONDANSETRON HCL 4 MG/2ML IJ SOLN
INTRAMUSCULAR | Status: DC | PRN
  Administered 2016-07-06: 4 mg via INTRAVENOUS

## 2016-07-06 MED ORDER — MORPHINE SULFATE 2 MG/ML IV SOLN
INTRAVENOUS | Status: DC
  Administered 2016-07-06: 0 mg via INTRAVENOUS
  Administered 2016-07-06: 18:00:00 via INTRAVENOUS
  Administered 2016-07-06: 1.5 mg via INTRAVENOUS
  Administered 2016-07-07: 9 mg via INTRAVENOUS
  Administered 2016-07-07: 0 mg via INTRAVENOUS
  Administered 2016-07-07: 1.5 mg via INTRAVENOUS
  Administered 2016-07-08: 3 mg via INTRAVENOUS
  Administered 2016-07-08: 0 mg via INTRAVENOUS
  Administered 2016-07-08: 1.5 mg via INTRAVENOUS
  Administered 2016-07-08: 3 mg via INTRAVENOUS
  Administered 2016-07-08: 0 mg via INTRAVENOUS
  Administered 2016-07-08: 6 mg via INTRAVENOUS
  Administered 2016-07-09: 8.81 mg via INTRAVENOUS
  Administered 2016-07-09: 7.5 mg via INTRAVENOUS
  Administered 2016-07-09: 3 mg via INTRAVENOUS
  Administered 2016-07-09: 1.5 mg via INTRAVENOUS
  Filled 2016-07-06: qty 25

## 2016-07-06 MED ORDER — SUGAMMADEX SODIUM 200 MG/2ML IV SOLN
INTRAVENOUS | Status: DC | PRN
  Administered 2016-07-06: 500 mg via INTRAVENOUS

## 2016-07-06 MED ORDER — ROCURONIUM BROMIDE 100 MG/10ML IV SOLN
INTRAVENOUS | Status: DC | PRN
  Administered 2016-07-06: 20 mg via INTRAVENOUS
  Administered 2016-07-06: 30 mg via INTRAVENOUS
  Administered 2016-07-06: 10 mg via INTRAVENOUS
  Administered 2016-07-06: 20 mg via INTRAVENOUS
  Administered 2016-07-06: 10 mg via INTRAVENOUS
  Administered 2016-07-06: 50 mg via INTRAVENOUS

## 2016-07-06 MED ORDER — LIDOCAINE 2% (20 MG/ML) 5 ML SYRINGE
INTRAMUSCULAR | Status: AC
  Filled 2016-07-06: qty 5

## 2016-07-06 MED ORDER — MIDAZOLAM HCL 2 MG/2ML IJ SOLN
INTRAMUSCULAR | Status: AC
  Filled 2016-07-06: qty 2

## 2016-07-06 MED ORDER — OXYCODONE HCL 5 MG/5ML PO SOLN: 5.0000 mg | Freq: Once | ORAL | Status: DC | PRN

## 2016-07-06 MED ORDER — PROMETHAZINE HCL 25 MG/ML IJ SOLN
12.5000 mg | Freq: Four times a day (QID) | INTRAMUSCULAR | Status: DC | PRN
Start: 2016-07-06 — End: 2016-07-11

## 2016-07-06 MED ORDER — HYDRALAZINE HCL 20 MG/ML IJ SOLN
5.0000 mg | INTRAMUSCULAR | Status: DC | PRN
  Filled 2016-07-06: qty 0.25

## 2016-07-06 MED ORDER — ROCURONIUM BROMIDE 10 MG/ML (PF) SYRINGE
PREFILLED_SYRINGE | INTRAVENOUS | Status: AC
  Filled 2016-07-06: qty 10

## 2016-07-06 MED ORDER — ESMOLOL HCL 100 MG/10ML IV SOLN
INTRAVENOUS | Status: AC
  Filled 2016-07-06: qty 10

## 2016-07-06 MED ORDER — OXYCODONE HCL 5 MG PO TABS: 5.0000 mg | ORAL_TABLET | Freq: Once | ORAL | Status: DC | PRN

## 2016-07-06 MED ORDER — BUPIVACAINE-EPINEPHRINE 0.25% -1:200000 IJ SOLN
INTRAMUSCULAR | Status: DC | PRN
  Administered 2016-07-06: 4 mL

## 2016-07-06 MED ORDER — MORPHINE SULFATE 2 MG/ML IV SOLN
INTRAVENOUS | Status: AC
  Filled 2016-07-06: qty 25

## 2016-07-06 MED ORDER — SUCCINYLCHOLINE CHLORIDE 200 MG/10ML IV SOSY
PREFILLED_SYRINGE | INTRAVENOUS | Status: AC
  Filled 2016-07-06: qty 10

## 2016-07-06 MED ORDER — SODIUM CHLORIDE 0.9% FLUSH: 9.0000 mL | INTRAVENOUS | Status: DC | PRN

## 2016-07-06 MED ORDER — LABETALOL HCL 5 MG/ML IV SOLN
10.0000 mg | INTRAVENOUS | Status: DC | PRN
  Administered 2016-07-06: 10 mg via INTRAVENOUS

## 2016-07-06 MED ORDER — ACETAMINOPHEN 10 MG/ML IV SOLN
INTRAVENOUS | Status: DC | PRN
  Administered 2016-07-06: 1000 mg via INTRAVENOUS

## 2016-07-06 MED ORDER — ACETAMINOPHEN 10 MG/ML IV SOLN
INTRAVENOUS | Status: AC
  Filled 2016-07-06: qty 100

## 2016-07-06 MED ORDER — CIPROFLOXACIN IN D5W 400 MG/200ML IV SOLN
400.0000 mg | INTRAVENOUS | Status: AC
  Administered 2016-07-06: 400 mg via INTRAVENOUS
  Filled 2016-07-06 (×2): qty 200

## 2016-07-06 MED ORDER — MIDAZOLAM HCL 2 MG/2ML IJ SOLN
INTRAMUSCULAR | Status: DC | PRN
  Administered 2016-07-06 (×2): 1 mg via INTRAVENOUS

## 2016-07-06 MED ORDER — ONDANSETRON HCL 4 MG/2ML IJ SOLN: 4.0000 mg | Freq: Four times a day (QID) | INTRAMUSCULAR | Status: DC | PRN

## 2016-07-06 MED ORDER — LIDOCAINE HCL (CARDIAC) 20 MG/ML IV SOLN
INTRAVENOUS | Status: DC | PRN
  Administered 2016-07-06: 100 mg via INTRAVENOUS

## 2016-07-06 MED ORDER — ONDANSETRON HCL 4 MG/2ML IJ SOLN
INTRAMUSCULAR | Status: AC
  Filled 2016-07-06: qty 2

## 2016-07-06 MED ORDER — HYDROMORPHONE HCL 1 MG/ML IJ SOLN
0.2500 mg | INTRAMUSCULAR | Status: DC | PRN
  Administered 2016-07-06: 0.25 mg via INTRAVENOUS

## 2016-07-06 MED ORDER — SUCCINYLCHOLINE CHLORIDE 20 MG/ML IJ SOLN
INTRAMUSCULAR | Status: DC | PRN
  Administered 2016-07-06: 130 mg via INTRAVENOUS

## 2016-07-06 MED ORDER — DIPHENHYDRAMINE HCL 50 MG/ML IJ SOLN: 12.5000 mg | Freq: Four times a day (QID) | INTRAMUSCULAR | Status: DC | PRN

## 2016-07-06 MED ORDER — 0.9 % SODIUM CHLORIDE (POUR BTL) OPTIME
TOPICAL | Status: DC | PRN
  Administered 2016-07-06: 2000 mL

## 2016-07-06 MED ORDER — HYDROMORPHONE HCL 1 MG/ML IJ SOLN
INTRAMUSCULAR | Status: DC | PRN
  Administered 2016-07-06: 0.5 mg via INTRAVENOUS
  Administered 2016-07-06 (×2): .25 mg via INTRAVENOUS

## 2016-07-06 MED ORDER — FENTANYL CITRATE (PF) 100 MCG/2ML IJ SOLN
INTRAMUSCULAR | Status: DC | PRN
  Administered 2016-07-06: 50 ug via INTRAVENOUS
  Administered 2016-07-06: 100 ug via INTRAVENOUS
  Administered 2016-07-06 (×2): 50 ug via INTRAVENOUS
  Administered 2016-07-06 (×2): 100 ug via INTRAVENOUS
  Administered 2016-07-06: 50 ug via INTRAVENOUS

## 2016-07-06 MED ORDER — SUGAMMADEX SODIUM 500 MG/5ML IV SOLN
INTRAVENOUS | Status: AC
  Filled 2016-07-06: qty 5

## 2016-07-06 SURGICAL SUPPLY — 56 items
BLADE SURG 10 STRL SS (BLADE) ×1 IMPLANT
BLADE SURG ROTATE 9660 (MISCELLANEOUS) ×1 IMPLANT
BRR ADH 5X3 SEPRAFILM 6 SHT (MISCELLANEOUS) ×1
CANISTER SUCTION 2500CC (MISCELLANEOUS) ×2 IMPLANT
CHLORAPREP W/TINT 26ML (MISCELLANEOUS) ×3 IMPLANT
COVER SURGICAL LIGHT HANDLE (MISCELLANEOUS) ×2 IMPLANT
DRAPE LAPAROSCOPIC ABDOMINAL (DRAPES) ×2 IMPLANT
DRAPE WARM FLUID 44X44 (DRAPE) ×2 IMPLANT
DRSG TEGADERM 4X4.75 (GAUZE/BANDAGES/DRESSINGS) ×2 IMPLANT
ELECT BLADE 6.5 EXT (BLADE) ×1 IMPLANT
ELECT CAUTERY BLADE 6.4 (BLADE) ×2 IMPLANT
ELECT REM PT RETURN 9FT ADLT (ELECTROSURGICAL) ×2
ELECTRODE REM PT RTRN 9FT ADLT (ELECTROSURGICAL) ×1 IMPLANT
GAUZE SPONGE 4X4 12PLY STRL (GAUZE/BANDAGES/DRESSINGS) ×1 IMPLANT
GLOVE BIOGEL M STRL SZ7.5 (GLOVE) ×2 IMPLANT
GLOVE BIOGEL PI IND STRL 6.5 (GLOVE) IMPLANT
GLOVE BIOGEL PI IND STRL 7.0 (GLOVE) IMPLANT
GLOVE BIOGEL PI IND STRL 8 (GLOVE) ×1 IMPLANT
GLOVE BIOGEL PI INDICATOR 6.5 (GLOVE) ×1
GLOVE BIOGEL PI INDICATOR 7.0 (GLOVE) ×1
GLOVE BIOGEL PI INDICATOR 8 (GLOVE) ×1
GLOVE SURG SS PI 6.5 STRL IVOR (GLOVE) ×1 IMPLANT
GLOVE SURG SS PI 7.0 STRL IVOR (GLOVE) ×3 IMPLANT
GLOVE SURG SS PI 7.5 STRL IVOR (GLOVE) ×2 IMPLANT
GOWN STRL REUS W/ TWL LRG LVL3 (GOWN DISPOSABLE) ×3 IMPLANT
GOWN STRL REUS W/TWL 2XL LVL3 (GOWN DISPOSABLE) ×2 IMPLANT
GOWN STRL REUS W/TWL LRG LVL3 (GOWN DISPOSABLE) ×6
HANDLE SUCTION POOLE (INSTRUMENTS) IMPLANT
KIT BASIN OR (CUSTOM PROCEDURE TRAY) ×2 IMPLANT
KIT ROOM TURNOVER OR (KITS) ×2 IMPLANT
NS IRRIG 1000ML POUR BTL (IV SOLUTION) ×4 IMPLANT
PAD ARMBOARD 7.5X6 YLW CONV (MISCELLANEOUS) ×4 IMPLANT
PENCIL BUTTON HOLSTER BLD 10FT (ELECTRODE) ×1 IMPLANT
PREVENA INCISION MGT 90 150 (MISCELLANEOUS) ×1 IMPLANT
SCISSORS LAP 5X35 DISP (ENDOMECHANICALS) ×1 IMPLANT
SEPRAFILM PROCEDURAL PACK 3X5 (MISCELLANEOUS) ×1 IMPLANT
SLEEVE ENDOPATH XCEL 5M (ENDOMECHANICALS) ×3 IMPLANT
SPONGE LAP 18X18 X RAY DECT (DISPOSABLE) ×1 IMPLANT
STAPLER VISISTAT 35W (STAPLE) ×3 IMPLANT
SUCTION POOLE HANDLE (INSTRUMENTS) ×2
SUCTION POOLE TIP (SUCTIONS) ×2 IMPLANT
SUT MNCRL AB 4-0 PS2 18 (SUTURE) ×2 IMPLANT
SUT PDS AB 1 TP1 96 (SUTURE) ×2 IMPLANT
SUT SILK 2 0 SH CR/8 (SUTURE) ×2 IMPLANT
SUT SILK 2 0 TIES 10X30 (SUTURE) ×2 IMPLANT
SUT SILK 3 0 SH CR/8 (SUTURE) ×3 IMPLANT
SUT SILK 3 0 TIES 10X30 (SUTURE) ×2 IMPLANT
SUT VIC AB 3-0 SH 18 (SUTURE) ×1 IMPLANT
SYR BULB 3OZ (MISCELLANEOUS) ×1 IMPLANT
TOWEL OR 17X24 6PK STRL BLUE (TOWEL DISPOSABLE) ×2 IMPLANT
TOWEL OR 17X26 10 PK STRL BLUE (TOWEL DISPOSABLE) ×2 IMPLANT
TRAY LAPAROSCOPIC MC (CUSTOM PROCEDURE TRAY) ×2 IMPLANT
TROCAR XCEL NON-BLD 5MMX100MML (ENDOMECHANICALS) ×2 IMPLANT
TUBE CONNECTING 12X1/4 (SUCTIONS) ×1 IMPLANT
TUBING INSUFFLATION (TUBING) ×2 IMPLANT
YANKAUER SUCT BULB TIP NO VENT (SUCTIONS) ×1 IMPLANT

## 2016-07-06 NOTE — Progress Notes (Addendum)
Progress Note: [facility]  Subjective: Still having abd discomfort. No flatus. No BM  Objective: Vital signs in last 24 hours: Temp:  [98.3 F (36.8 C)-99.6 F (37.6 C)] 98.3 F (36.8 C) (10/13 0606) Pulse Rate:  [97-105] 100 (10/13 0606) Resp:  [20] 20 (10/12 1430) BP: (154-157)/(36-82) 154/82 (10/13 0606) SpO2:  [98 %] 98 % (10/13 0606) Last BM Date: 07/03/16  Intake/Output from previous day: 10/12 0701 - 10/13 0700 In: 4109.6 [P.O.:120; I.V.:2839.6; NG/GT:1000; IV Piggyback:150] Out: 5400 [Urine:2350; Emesis/NG output:3050] Intake/Output this shift: No intake/output data recorded.  Nontoxic Obese, soft, mild TTP throughout  Lab Results: CBC   Recent Labs  07/05/16 0532 07/06/16 0553  WBC 4.6 5.9  HGB 12.8 12.7  HCT 39.0 38.8  PLT 239 252   BMET  Recent Labs  07/05/16 0532 07/06/16 0553  NA 142 143  K 3.9 3.6  CL 114* 114*  CO2 21* 21*  GLUCOSE 92 82  BUN 27* 20  CREATININE 0.93 0.88  CALCIUM 8.6* 8.7*   PT/INR No results for input(s): LABPROT, INR in the last 72 hours. ABG No results for input(s): PHART, HCO3 in the last 72 hours.  Invalid input(s): PCO2, PO2  Studies/Results:  Anti-infectives: Anti-infectives    Start     Dose/Rate Route Frequency Ordered Stop   07/06/16 1000  ciprofloxacin (CIPRO) IVPB 400 mg     400 mg 200 mL/hr over 60 Minutes Intravenous On call 07/06/16 0956 07/07/16 1000      Medications: Scheduled Meds: . chlorhexidine  15 mL Mouth Rinse BID  . ciprofloxacin  400 mg Intravenous On Call  . cloNIDine  0.1 mg Transdermal Q Tue  . enoxaparin (LOVENOX) injection  60 mg Subcutaneous Q24H  . famotidine (PEPCID) IV  20 mg Intravenous Q12H  . mouth rinse  15 mL Mouth Rinse q12n4p   Continuous Infusions: . sodium chloride 125 mL/hr at 07/06/16 0538   PRN Meds:.acetaminophen **OR** acetaminophen, hydrALAZINE, menthol-cetylpyridinium, morphine injection, ondansetron **OR** ondansetron (ZOFRAN) IV,  phenol  Assessment/Plan: Patient Active Problem List   Diagnosis Date Noted  . Pancreatitis 07/02/2016  . SBO (small bowel obstruction) 07/02/2016  . Long term current use of opiate analgesic 06/26/2016  . Long term prescription opiate use 06/26/2016  . Opiate use 06/26/2016  . Encounter for therapeutic drug level monitoring 06/26/2016  . Encounter for pain management planning 06/26/2016  . Chronic pain 06/26/2016  . Disturbance of skin sensation 06/26/2016  . Chronic low back pain (Location of Primary Source of Pain) (Bilateral) (R>L) 06/26/2016  . Chronic hip pain (Location of Secondary source of pain) (Bilateral) (R>L) 06/26/2016  . 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 Chronic shoulder pain (Location of Tertiary source of pain) (Bilateral) (R>L) 06/26/2016  . 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 Chronic knee pain (Bilateral) (R>L) 06/26/2016  . 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 Chronic neck pain (Right) 06/26/2016  . Lumbar spondylosis 06/26/2016  . Lumbar facet syndrome 06/26/2016  . Lumbar facet hypertrophy 06/26/2016  . 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 Neurogenic pain 06/26/2016  . Occipital headaches 06/26/2016  . 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 History of lumbar fusion 06/26/2016  . 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  . Neuropathic pain of both legs 10/13/2015  . Constipation 10/13/2015  . Complete rotator cuff tear of left shoulder 05/05/2014  . Depression 08/06/2013  . Hypokalemia 08/06/2013  . GERD (gastroesophageal reflux disease) 08/06/2013  .  Spinal stenosis of lumbar region 08/06/2013  . Bladder spasm 08/06/2013  . Obesity, Class III, BMI 40-49.9 (morbid obesity) ([place]) 02/03/2013  . Lipoma of buttock s/p excision 02/20/2013 02/03/2013  . DYSPNEA 01/23/2008  . OBSTRUCTIVE SLEEP APNEA 09/12/2007  . Essential hypertension 09/12/2007  .  Allergic rhinitis 09/12/2007  . SLEEPINESS 09/12/2007  . ANGINA, HX OF 09/12/2007    Persist pSBO Still has high NG output and obstipated. At this point she has failed nonoperative mgmt. I have recommended proceeding to OR later today for surgery: LAPAROSCOPY DIAGNOSTIC POSSIBLE EXPLORATORY LAPAROTOMY AND POSSIBLE BOWEL RESECTION 07/06/2016  Pt agrees Will discuss risk/benefits with family later this am Iv abx on call [place] M. Redmond Pulling, MD, FACS General, Bariatric, & Minimally Invasive Surgery [facility]   Disposition:  LOS: 4 days   ADDENDUM 11:57AM Now that daughter here I discussed risk/benefits of surgery and rationale for surgery  I discussed the procedure in detail.   We discussed the risks and benefits of surgery including, but not limited to bleeding, infection (such as wound infection, abdominal abscess), injury to surrounding structures, blood clot formation, urinary retention, incisional hernia, (anastomotic stricture, anastomotic leak), enterotomy, anesthesia risks, pulmonary & cardiac complications such as pneumonia &/or heart attack, need for additional procedures, ileus, & prolonged hospitalization.  We discussed the typical postoperative recovery course, including limitations & restrictions postoperatively. I explained that the likelihood of improvement in their symptoms is good.   Gayland Curry, MD [phone] [facility]

## 2016-07-06 NOTE — Anesthesia Procedure Notes (Signed)
Procedure Name: Intubation Date/Time: 07/06/2016 12:53 PM Performed by: Barrington Ellison Pre-anesthesia Checklist: Patient identified, Emergency Drugs available, Suction available, Patient being monitored and Timeout performed Patient Re-evaluated:Patient Re-evaluated prior to inductionOxygen Delivery Method: Circle system utilized Preoxygenation: Pre-oxygenation with 100% oxygen Intubation Type: IV induction, Rapid sequence and Cricoid Pressure applied Laryngoscope Size: Glidescope and 3 Grade View: Grade I Tube type: Oral Tube size: 7.0 mm Number of attempts: 1 Airway Equipment and Method: Stylet and Video-laryngoscopy Placement Confirmation: ETT inserted through vocal cords under direct vision,  breath sounds checked- equal and bilateral and CO2 detector Secured at: 21 cm Tube secured with: Tape Dental Injury: Teeth and Oropharynx as per pre-operative assessment  Comments: OGT connected to suction prior to induction

## 2016-07-06 NOTE — Progress Notes (Signed)
PROGRESS NOTE  Amy [name] [id number] DOB: 1946/05/25 DOA: 07/02/2016 PCP: Blanchie Serve, MD   LOS: 4 days   Brief Narrative: Amy [name] is a 70 y.o. woman with a history of HTN, GERD, DVT (she is not on anticoagulation currently), and chronic pain who presents to the ED for evaluation of abdominal pain x 5 five days.  She does not recall a specific trigger.  She is not sure that food has made it worse because she has had decreased PO intake.  Within the past 24 hours, she has developed nausea and vomiting. She was also found to have an elevated lipase, CT scan showed small bowel obstruction. General surgery was consulted and patient was admitted to medicine service  Assessment & Plan: Principal Problem:   SBO (small bowel obstruction) Active Problems:   Essential hypertension   Allergic rhinitis   GERD (gastroesophageal reflux disease)   Chronic pain   Pancreatitis   Small bowel obstruction - Possibly related to adhesions related to previous surgeries, she has had history of the SBO in the past requiring LOA  - Gen. surgery consulted, appreciate input, high NGT output:  3050 cc /24h  - Continue  nothing by mouth, continue IV fluids, analgesics and antiemetics,high NGT output Will give dulcolax suppository to help stimulate bowel function. Lipase improving, still very nauseous ,agree with repeat CT scan on 10/12 showed small bowel obstruction with decompression of the stomach, pancreas appear normal proceeding to OR later today   Acute pancreatitis/ elevated lipase -initial  CT scan however shows no significant pancreatic changes, abdominal ultrasound with minimal soft tissue inflammation around the pancreatic head concerning for mild acute pancreatitis, significant elevation of lipase could also be related to #1  It is possible to see lipase elevation in small bowel obstruction after gastric bypass     HTN - continue home medications with clonidine  patch, blood pressure controlled at 128/85 Continue telemetry, when necessary hydralazine  GERD - pepcid  Elevated LFTs - related to ?pancreatitis, she is s/p cholecystectomy. LFs and lipase improving.  - CBD without dilatation so less likely choledocholithiasis, bilirubin is normal today   DVT prophylaxis: Lovenox Code Status: DNR Family Communication: no family bedside Disposition Plan: home when ready   Consultants:   General surgery  Procedures:   None   Antimicrobials:  None    Subjective:      Continues to have high NG tube output, nausea   Objective: Vitals:   07/05/16 0900 07/05/16 1430 07/05/16 2127 07/06/16 0606  BP: 135/76 (!) 155/36 (!) 157/77 (!) 154/82  Pulse: (!) 102 97 (!) 105 100  Resp: 20 20    Temp: 99.1 F (37.3 C) 99.6 F (37.6 C) 98.9 F (37.2 C) 98.3 F (36.8 C)  TempSrc: Oral Oral Oral Oral  SpO2: 98%  98% 98%  Weight:      Height:        Intake/Output Summary (Last 24 hours) at 07/06/16 0805 Last data filed at 07/06/16 0610  Gross per 24 hour  Intake          4109.58 ml  Output             5400 ml  Net         -1290.42 ml   Filed Weights   07/02/16 1500 07/03/16 0044  Weight: 117.9 kg (260 lb) 123.8 kg (273 lb)    Examination: Constitutional: NAD Vitals:   07/05/16 0900 07/05/16 1430 07/05/16 2127 07/06/16 0606  BP: 135/76 Marland Kitchen)  155/36 (!) 157/77 (!) 154/82  Pulse: (!) 102 97 (!) 105 100  Resp: 20 20    Temp: 99.1 F (37.3 C) 99.6 F (37.6 C) 98.9 F (37.2 C) 98.3 F (36.8 C)  TempSrc: Oral Oral Oral Oral  SpO2: 98%  98% 98%  Weight:      Height:       Eyes: PERRL, lids and conjunctivae normal ENMT: Mucous membranes are dry Neck: normal, supple, no masses, no thyromegaly Respiratory: clear to auscultation bilaterally, no wheezing, no crackles.  Cardiovascular: Regular rate and rhythm, no murmurs / rubs / gallops.  Abdomen: abdomen with tenderness throughout. No BS Musculoskeletal: no clubbing / cyanosis.    Skin: no rashes, lesions, ulcers. No induration Neurologic: non focal    Data Reviewed: I have personally reviewed following labs and imaging studies  CBC:  Recent Labs Lab 07/02/16 1650 07/03/16 0627 07/04/16 0318 07/05/16 0532 07/06/16 0553  WBC 10.1 4.5 3.5* 4.6 5.9  NEUTROABS 8.2* 3.0  --   --   --   HGB 15.4* 14.5 13.5 12.8 12.7  HCT 45.4 44.1 40.7 39.0 38.8  MCV 91.3 91.9 91.5 92.4 91.7  PLT 323 307 276 239 [id number]   Basic Metabolic Panel:  Recent Labs Lab 07/02/16 1650 07/03/16 0627 07/04/16 0318 07/05/16 0532 07/06/16 0553  NA 134* 136 140 142 143  K 3.7 3.5 3.7 3.9 3.6  CL 100* 102 108 114* 114*  CO2 20* 24 23 21* 21*  GLUCOSE 160* 138* 105* 92 82  BUN 23* 21* 28* 27* 20  CREATININE 0.87 0.87 0.98 0.93 0.88  CALCIUM 9.9 9.1 8.5* 8.6* 8.7*   GFR: Estimated Creatinine Clearance: 78.6 mL/min (by C-G formula based on SCr of 0.88 mg/dL). Liver Function Tests:  Recent Labs Lab 07/02/16 1650 07/03/16 0627 07/04/16 0318 07/05/16 0532 07/06/16 0553  AST 210* 72* 37 27 27  ALT 149* 111* 70* 50 40  ALKPHOS 113 94 80 73 72  BILITOT 1.8* 1.1 1.0 0.9 0.6  PROT 8.3* 7.2 6.5 6.6 6.6  ALBUMIN 4.1 3.5 2.9* 2.8* 2.8*    Recent Labs Lab 07/02/16 1650 07/03/16 0627 07/04/16 0318  LIPASE 3,627* 627* 61*   No results for input(s): AMMONIA in the last 168 hours. Coagulation Profile:  Recent Labs Lab 07/03/16 0627  INR 1.08   Lipid Profile: No results for input(s): CHOL, HDL, LDLCALC, TRIG, CHOLHDL, LDLDIRECT in the last 72 hours. Urine analysis:    Component Value Date/Time   COLORURINE AMBER (A) 07/03/2016 0721   APPEARANCEUR CLEAR 07/03/2016 0721   LABSPEC >1.046 (H) 07/03/2016 0721   PHURINE 6.0 07/03/2016 0721   GLUCOSEU NEGATIVE 07/03/2016 0721   HGBUR NEGATIVE 07/03/2016 0721   BILIRUBINUR SMALL (A) 07/03/2016 0721   KETONESUR NEGATIVE 07/03/2016 0721   PROTEINUR 100 (A) 07/03/2016 0721   UROBILINOGEN 1.0 04/21/2014 0756   NITRITE NEGATIVE  07/03/2016 0721   LEUKOCYTESUR NEGATIVE 07/03/2016 0721   Radiology Studies: Ct Abdomen Pelvis W Contrast  Result Date: 07/05/2016 CLINICAL DATA:  70 year old female with recently diagnosed small bowel obstruction and pancreatitis. Continued abdominal pelvic pain with nausea. EXAM: CT ABDOMEN AND PELVIS WITH CONTRAST TECHNIQUE: Multidetector CT imaging of the abdomen and pelvis was performed using the standard protocol following bolus administration of intravenous contrast. CONTRAST:  100 cc intravenous Isovue 300 COMPARISON:  07/02/2016 and prior CTs. FINDINGS: Lower chest: Increased moderate bibasilar atelectasis identified. Hepatobiliary: The liver is unremarkable. The patient is status post cholecystectomy. There is no evidence of biliary dilatation.  Pancreas: Unremarkable Spleen: Unremarkable Adrenals/Urinary Tract: The kidneys, adrenal glands and bladder are unremarkable except for unchanged 1.7 cm left adrenal adenoma. Stomach/Bowel: An NG tube is present with tip in the mid stomach. Dilated proximal-mid small bowel loops are again identified with collapsed distal small bowel loops, compatible with small bowel obstruction. There has been interval decreased caliber of stomach and dilated small bowel loops since the prior study. There is no evidence of definite bowel wall thickening or pneumoperitoneum. Vascular/Lymphatic: Abdominal aortic atherosclerotic calcifications noted without aneurysm. No enlarged lymph nodes identified. Reproductive: Patient is status post hysterectomy. No adnexal masses identified. Other: No free fluid or focal collection/abscess. Musculoskeletal: Surgical changes within the lumbar spine again identified. No acute or suspicious abnormality. IMPRESSION: Small bowel obstruction again noted, with decompression of stomach and decreased caliber of dilated small bowel loops from NG tube within the stomach. No evidence of abscess, free fluid or pneumoperitoneum. Increased moderate  bibasilar atelectasis. Abdominal aortic atherosclerosis. Electronically Signed   By: Margarette Canada M.D.   On: 07/05/2016 13:26   Dg Chest Port 1 View  Result Date: 07/05/2016 CLINICAL DATA:  Chest pain EXAM: PORTABLE CHEST 1 VIEW COMPARISON:  07/02/2016 FINDINGS: Cardiomediastinal silhouette is stable. Stable atelectasis or scarring right base. There is streaky atelectasis or infiltrate left base. No pulmonary edema. NG tube poorly visualized due to patient's large body habitus. IMPRESSION: No pulmonary edema. Right base atelectasis or scarring. Streaky left basilar atelectasis or infiltrate. Electronically Signed   By: Lahoma Crocker M.D.   On: 07/05/2016 12:28   Dg Abd Portable 1v  Result Date: 07/05/2016 CLINICAL DATA:  Small-bowel obstruction. EXAM: PORTABLE ABDOMEN - 1 VIEW COMPARISON:  07/04/2016 FINDINGS: NG tube in the stomach. Stomach is decompressed. No dilated loops of bowel identified. No bowel wall edema. Lumbar fusion hardware unchanged. IMPRESSION: Nonobstructive bowel gas pattern.  NG tube remains in the stomach. Electronically Signed   By: Franchot Gallo M.D.   On: 07/05/2016 08:43     Scheduled Meds: . chlorhexidine  15 mL Mouth Rinse BID  . cloNIDine  0.1 mg Transdermal Q Tue  . enoxaparin (LOVENOX) injection  60 mg Subcutaneous Q24H  . famotidine (PEPCID) IV  20 mg Intravenous Q12H  . mouth rinse  15 mL Mouth Rinse q12n4p   Continuous Infusions: . sodium chloride 125 mL/hr at 07/06/16 0538     [facility] Pager [phone]  If 7PM-7AM, please contact night-coverage [email or web address] Password [email or web address] 07/06/2016, 8:05 AM

## 2016-07-06 NOTE — Anesthesia Preprocedure Evaluation (Addendum)
Anesthesia Evaluation  Patient identified by MRN, date of birth, ID band Patient awake    Reviewed: Allergy & Precautions, NPO status , Patient's Chart, lab work & pertinent test results  Airway Mallampati: III   Neck ROM: full    Dental  (+) Teeth Intact, Chipped, Dental Advisory Given,    Pulmonary shortness of breath, asthma , sleep apnea , former smoker,    breath sounds clear to auscultation       Cardiovascular hypertension, + Peripheral Vascular Disease   Rhythm:regular Rate:Normal     Neuro/Psych  Headaches, Anxiety Depression    GI/Hepatic hiatal hernia, GERD  ,SBO   Endo/Other    Renal/GU      Musculoskeletal  (+) Arthritis , Fibromyalgia -  Abdominal   Peds  Hematology   Anesthesia Other Findings   Reproductive/Obstetrics                            Anesthesia Physical Anesthesia Plan  ASA: III and emergent  Anesthesia Plan: General   Post-op Pain Management:    Induction: Intravenous, Rapid sequence and Cricoid pressure planned  Airway Management Planned: Oral ETT  Additional Equipment:   Intra-op Plan:   Post-operative Plan: Extubation in OR  Informed Consent: I have reviewed the patients History and Physical, chart, labs and discussed the procedure including the risks, benefits and alternatives for the proposed anesthesia with the patient or authorized representative who has indicated his/her understanding and acceptance.     Plan Discussed with: CRNA, Anesthesiologist and Surgeon  Anesthesia Plan Comments:         Anesthesia Quick Evaluation

## 2016-07-06 NOTE — Anesthesia Postprocedure Evaluation (Signed)
Anesthesia Post Note  Patient: Amy [name]  Procedure(s) Performed: Procedure(s) (LRB): LAPAROSCOPY DIAGNOSTIC EMERGENT TO OPEN (N/A) EXPLORATORY LAPAROTOMY LYSIS OF ADHESIONS FOR 3 HOURS (N/A)  Patient location during evaluation: PACU Anesthesia Type: General Level of consciousness: awake and alert Pain management: pain level controlled Vital Signs Assessment: post-procedure vital signs reviewed and stable Respiratory status: spontaneous breathing, nonlabored ventilation, respiratory function stable and patient connected to nasal cannula oxygen Cardiovascular status: blood pressure returned to baseline and stable Postop Assessment: no signs of nausea or vomiting Anesthetic complications: no    Last Vitals:  Vitals:   07/06/16 1856 07/06/16 1931  BP: (!) 181/86   Pulse: 95   Resp: 19 14  Temp: 36.4 C     Last Pain:  Vitals:   07/06/16 1931  TempSrc:   PainSc: Asleep                 CREWS,DAVID A

## 2016-07-06 NOTE — OR Nursing (Signed)
Daughter will be updated on pt status per Ignacia Marvel, RN.

## 2016-07-06 NOTE — Brief Op Note (Addendum)
07/02/2016 - 07/06/2016  5:09 PM  PATIENT:  Amy [name]  70 y.o. female  PRE-OPERATIVE DIAGNOSIS:  PARTIAL SMALL BOWEL OBSTRUCTION  POST-OPERATIVE DIAGNOSIS:  same  PROCEDURE:  Procedure(s): LAPAROSCOPY DIAGNOSTIC EMERGENT TO OPEN (N/A) EXPLORATORY LAPAROTOMY LYSIS OF ADHESIONS FOR 3 HOURS (N/A) PLACEMENT OF PREVENA SKIN WOUND VAC  SURGEON:  Surgeon(s) and Role:    * Greer Pickerel, MD - Primary  PHYSICIAN ASSISTANT:   ASSISTANTS: none   ANESTHESIA:   general  EBL:  Total I/O In: 2500 [I.V.:2500] Out: 1800 [Urine:650; Emesis/NG output:250; Other:650; Blood:250]  BLOOD ADMINISTERED:none  DRAINS: Nasogastric Tube and Urinary Catheter (Foley)   LOCAL MEDICATIONS USED:  NONE  SPECIMEN:  No Specimen  DISPOSITION OF SPECIMEN:  N/A  COUNTS:  YES  TOURNIQUET:  * No tourniquets in log *  DICTATION: .Other Dictation: Dictation Number [id number]  PLAN OF CARE: pacu then back to floor  PATIENT DISPOSITION:  PACU - hemodynamically stable.   Delay start of Pharmacological VTE agent (>24hrs) due to surgical blood loss or risk of bleeding: no  Leighton Ruff. Redmond Pulling, MD, FACS General, Bariatric, & Minimally Invasive Surgery [facility], [place]

## 2016-07-06 NOTE — Care Management Important Message (Signed)
Important Message  Patient Details  Name: Amy [name] MRN: [id number] Date of Birth: 12-12-1945   Medicare Important Message Given:  Yes    [name], [name] 07/06/2016, 10:38 AM

## 2016-07-06 NOTE — Transfer of Care (Signed)
Immediate Anesthesia Transfer of Care Note  Patient: Amy [name]  Procedure(s) Performed: Procedure(s): LAPAROSCOPY DIAGNOSTIC EMERGENT TO OPEN (N/A) EXPLORATORY LAPAROTOMY LYSIS OF ADHESIONS FOR 3 HOURS (N/A)  Patient Location: PACU  Anesthesia Type:General  Level of Consciousness: lethargic and responds to stimulation  Airway & Oxygen Therapy: Patient Spontanous Breathing and Patient connected to nasal cannula oxygen  Post-op Assessment: Report given to RN  Post vital signs: Reviewed and stable  Last Vitals:  Vitals:   07/05/16 2127 07/06/16 0606  BP: (!) 157/77 (!) 154/82  Pulse: (!) 105 100  Resp:    Temp: 37.2 C 36.8 C    Last Pain:  Vitals:   07/06/16 0852  TempSrc:   PainSc: 0-No pain      Patients Stated Pain Goal: 0 ([id number])  Complications: No apparent anesthesia complications

## 2016-07-07 ENCOUNTER — Inpatient Hospital Stay (HOSPITAL_COMMUNITY): Payer: Medicare Other

## 2016-07-07 ENCOUNTER — Encounter (HOSPITAL_COMMUNITY): Payer: Self-pay | Admitting: Radiology

## 2016-07-07 DIAGNOSIS — K560 Paralytic ileus: Secondary | ICD-10-CM

## 2016-07-07 DIAGNOSIS — R0602 Shortness of breath: Secondary | ICD-10-CM

## 2016-07-07 LAB — TSH: TSH: 2.825 u[IU]/mL (ref 0.350–4.500)

## 2016-07-07 LAB — COMPREHENSIVE METABOLIC PANEL
ALT: 32 U/L (ref 14–54)
AST: 25 U/L (ref 15–41)
Albumin: 2.2 g/dL — ABNORMAL LOW (ref 3.5–5.0)
Alkaline Phosphatase: 62 U/L (ref 38–126)
Anion gap: 8 (ref 5–15)
BUN: 18 mg/dL (ref 6–20)
CO2: 23 mmol/L (ref 22–32)
Calcium: 8.3 mg/dL — ABNORMAL LOW (ref 8.9–10.3)
Chloride: 115 mmol/L — ABNORMAL HIGH (ref 101–111)
Creatinine, Ser: 0.98 mg/dL (ref 0.44–1.00)
GFR calc Af Amer: >60 mL/min (ref >60)
GFR calc non Af Amer: 57 mL/min — ABNORMAL LOW (ref >60)
Glucose, Bld: 137 mg/dL — ABNORMAL HIGH (ref 65–99)
Potassium: 3.9 mmol/L (ref 3.5–5.1)
Sodium: 146 mmol/L — ABNORMAL HIGH (ref 135–145)
Total Bilirubin: 1.3 mg/dL — ABNORMAL HIGH (ref 0.3–1.2)
Total Protein: 5.9 g/dL — ABNORMAL LOW (ref 6.5–8.1)

## 2016-07-07 LAB — TROPONIN I
Troponin I: 0.03 ng/mL (ref <0.03)
Troponin I: 0.06 ng/mL (ref <0.03)
Troponin I: 0.03 ng/mL (ref <0.03)

## 2016-07-07 LAB — MRSA PCR SCREENING: MRSA by PCR: NEGATIVE

## 2016-07-07 LAB — CBC
HCT: 44.5 % (ref 36.0–46.0)
Hemoglobin: 14.5 g/dL (ref 12.0–15.0)
MCH: 29.8 pg (ref 26.0–34.0)
MCHC: 32.6 g/dL (ref 30.0–36.0)
MCV: 91.4 fL (ref 78.0–100.0)
Platelets: 279 10*3/uL (ref 150–400)
RBC: 4.87 MIL/uL (ref 3.87–5.11)
RDW: 16 % — ABNORMAL HIGH (ref 11.5–15.5)
WBC: 7.2 10*3/uL (ref 4.0–10.5)

## 2016-07-07 LAB — D-DIMER, QUANTITATIVE: D-Dimer, Quant: 15.15 ug/mL-FEU — ABNORMAL HIGH (ref 0.00–0.50)

## 2016-07-07 MED ORDER — IOPAMIDOL (ISOVUE-370) INJECTION 76%
INTRAVENOUS | Status: AC
  Administered 2016-07-07: 100 mL
  Filled 2016-07-07: qty 100

## 2016-07-07 MED ORDER — ACETAMINOPHEN 10 MG/ML IV SOLN
1000.0000 mg | Freq: Four times a day (QID) | INTRAVENOUS | Status: AC
  Administered 2016-07-07 – 2016-07-08 (×4): 1000 mg via INTRAVENOUS
  Filled 2016-07-07 (×4): qty 100

## 2016-07-07 MED ORDER — HYDRALAZINE HCL 20 MG/ML IJ SOLN
10.0000 mg | Freq: Four times a day (QID) | INTRAMUSCULAR | Status: DC | PRN
  Administered 2016-07-11: 10 mg via INTRAVENOUS
  Filled 2016-07-07: qty 1

## 2016-07-07 MED ORDER — KCL IN DEXTROSE-NACL 20-5-0.45 MEQ/L-%-% IV SOLN
INTRAVENOUS | Status: DC
  Administered 2016-07-07 – 2016-07-08 (×2): via INTRAVENOUS
  Filled 2016-07-07 (×2): qty 1000

## 2016-07-07 MED ORDER — METOPROLOL TARTRATE 5 MG/5ML IV SOLN: 5.0000 mg | Freq: Four times a day (QID) | INTRAVENOUS | Status: DC | PRN

## 2016-07-07 NOTE — Progress Notes (Signed)
Pt has a HR=123; pt denies cp; Other vital signs are stable. Called Dr. Maudie Mercury made aware. New orders put in. Will continue to monitor pt.

## 2016-07-07 NOTE — Progress Notes (Signed)
1 Day Post-Op  Subjective: Tachy overnight. Per Dr Allyson Sabal she was HTN up to 224mmHg but I don't see that documented. No flatus. Doesn't talk much. Daughter at [facility]. Looks like she is in pain.   Objective: Vital signs in last 24 hours: Temp:  [97.5 F (36.4 C)-99.1 F (37.3 C)] 99.1 F (37.3 C) (10/14 1019) Pulse Rate:  [95-123] 122 (10/14 1019) Resp:  [14-21] 19 (10/14 1230) BP: (141-182)/(74-92) 141/74 (10/14 1019) SpO2:  [97 %-100 %] 97 % (10/14 1230) FiO2 (%):  [97 %-98 %] 98 % (10/14 0327) Last BM Date: 07/04/16  Intake/Output from previous day: 10/13 0701 - 10/14 0700 In: 3896.7 [I.V.:3866.7; NG/GT:30] Out: 2800 [Urine:1250; Emesis/NG output:650; Blood:250] Intake/Output this shift: Total I/O In: 80 [NG/GT:30; IV Piggyback:50] Out: 100 [Emesis/NG output:100]  Alert, but appears uncomfortable cta but decreased BS at bases. IS 750 Tachy Obese, soft, approp TTP, skin vac intact No edema. No calf tenderness  Lab Results:   Recent Labs  07/06/16 0553 07/07/16 0712  WBC 5.9 7.2  HGB 12.7 14.5  HCT 38.8 44.5  PLT 252 279   BMET  Recent Labs  07/06/16 0553 07/07/16 0712  NA 143 146*  K 3.6 3.9  CL 114* 115*  CO2 21* 23  GLUCOSE 82 137*  BUN 20 18  CREATININE 0.88 0.98  CALCIUM 8.7* 8.3*   PT/INR No results for input(s): LABPROT, INR in the last 72 hours. ABG No results for input(s): PHART, HCO3 in the last 72 hours.  Invalid input(s): PCO2, PO2  Studies/Results: Ct Angio Chest Pe W Or Wo Contrast  Result Date: 07/07/2016 CLINICAL DATA:  Exploratory laparotomy yesterday. Shortness of breath. Evaluate for pulmonary embolus. EXAM: CT ANGIOGRAPHY CHEST WITH CONTRAST TECHNIQUE: Multidetector CT imaging of the chest was performed using the standard protocol during bolus administration of intravenous contrast. Multiplanar CT image reconstructions and MIPs were obtained to evaluate the vascular anatomy. CONTRAST:  100 cc Isovue 370 COMPARISON:  None. FINDINGS:  Cardiovascular: Heart size upper normal. No pericardial effusion. No thoracic aortic aneurysm. Assessment of segmental and subsegmental pulmonary arteries to the lower lobes is degraded by respiratory motion, but within this limitation there is no CT evidence for an acute pulmonary embolus. Mediastinum/Nodes: No mediastinal lymphadenopathy. There is no hilar lymphadenopathy. There is no axillary lymphadenopathy. NG tube passes into the stomach with nonvisualization of the distal tip. Lungs/Pleura: Bilateral lower lobe subsegmental atelectasis noted. Focal bandlike opacity in the lingula likely also atelectatic. 7 mm ground-glass nodule identified right lower lobe (image 59 series 5). No evidence for pneumothorax. No pleural effusion. Upper Abdomen: Stomach is fluid distended despite the presence of an NG tube. Musculoskeletal: Bone windows reveal no worrisome lytic or sclerotic osseous lesions. Review of the MIP images confirms the above findings. IMPRESSION: 1. No CT evidence for acute pulmonary embolus. 2. Bilateral lower lobe subsegmental atelectasis with probable lingular atelectasis associated. 3. Stomach is fluid distended despite the presence of an NG tube. Electronically Signed   By: Misty Stanley M.D.   On: 07/07/2016 14:31    Anti-infectives: Anti-infectives    Start     Dose/Rate Route Frequency Ordered Stop   07/06/16 1600  ciprofloxacin (CIPRO) IVPB 400 mg     400 mg 200 mL/hr over 60 Minutes Intravenous To [facility] 07/06/16 0956 07/06/16 1254      Assessment/Plan: s/p Procedure(s): LAPAROSCOPY DIAGNOSTIC EMERGENT TO OPEN (N/A) EXPLORATORY LAPAROTOMY LYSIS OF ADHESIONS FOR 3 HOURS (N/A) 07/06/2016  Tachy overnight with some mild HTN. [facility]  concerned for PE and is ordering CTA chest. Don't think this is unreasonable but my suspicion is pain not controlled hence tachy, htn  Will add scheduled tylenol.  Given upcoming dye load, a little reluctant to put on scheduled toradol  but will give 1 dose of 15mg  Needs aggressive pulm toilet Cont foley today Agree with tx to SDU PT/OT tomorrow Discussed with daughter/husband May need dilaudid PCA  Amy Ruff. Redmond Pulling, MD, FACS General, Bariatric, & Minimally Invasive Surgery [facility], [place]    LOS: 5 days    Amy [name] 07/07/2016

## 2016-07-07 NOTE — Progress Notes (Signed)
CRITICAL VALUE ALERT  Critical value received:  Triponin 0.03 Date of notification:  07/07/16  Time of notification:  [id number] Critical value read back:yes Nurse who received alert: Jeralene Peters, RN MD notified (1st page):  Abrol Time of first page:  0934 MD notified (2nd page):N/A  Time of second page:N/A  Responding MD: Allyson Sabal Time MD responded:  [id number]

## 2016-07-07 NOTE — Progress Notes (Addendum)
PROGRESS NOTE  Amy [name] [id number] DOB: Jan 06, 1946 DOA: 07/02/2016 PCP: Blanchie Serve, MD   LOS: 5 days   Brief Narrative: Amy [name] is a 70 y.o. woman with a history of HTN, GERD, DVT (she is not on anticoagulation currently), and chronic pain who presents to the ED for evaluation of abdominal pain x 5 five days.  She does not recall a specific trigger.  She is not sure that food has made it worse because she has had decreased PO intake.  Within the past 24 hours, she has developed nausea and vomiting. She was also found to have an elevated lipase, CT scan showed small bowel obstruction. General surgery was consulted and patient was admitted to medicine service  Assessment & Plan: Principal Problem:   SBO (small bowel obstruction) Active Problems:   Essential hypertension   Allergic rhinitis   GERD (gastroesophageal reflux disease)   Chronic pain   Pancreatitis  Chest pain,sinus tachycardia HR 120'130's at rest Left sided CP, last night, troponin 0.03, hypertensive into 180's post op D-dimer hemo lysed tx to step down, prn metoprolol for HR >110,  CT PE protocol, DVT study to r/o PE/DVT Monitor closely to see if troponin trends up, ECHO She appears well hydrated   Small bowel obstruction - Possibly related to adhesions related to previous surgeries, she has had history of the SBO in the past requiring LOA  - Gen. surgery consulted, appreciate input,   - Continue  nothing by mouth, continue IV fluids, analgesics and antiemetics,high NGT output   repeat CT scan on 10/12 showed small bowel obstruction with decompression of the stomach, pancreas appear normal  s/p LAPAROSCOPY DIAGNOSTIC EMERGENT TO OPEN (N/A) 10/13 EXPLORATORY LAPAROTOMY LYSIS OF ADHESIONS FOR 3 HOURS (N/A)  hemoglobin stable    Acute pancreatitis/ elevated lipase -initial  CT scan however shows no significant pancreatic changes, abdominal ultrasound with minimal soft tissue  inflammation around the pancreatic head concerning for mild acute pancreatitis, significant elevation of lipase could also be related to #1  It is possible to see lipase elevation in small bowel obstruction       HTN - continue home medications with clonidine patch, blood pressure controlled at 128/85 Continue telemetry, when necessary hydralazine  GERD - pepcid  Elevated LFTs - related to ?pancreatitis, she is s/p cholecystectomy. LFs and lipase improving.  - CBD without dilatation so less likely choledocholithiasis, bilirubin is normal today   DVT prophylaxis: Lovenox Code Status: DNR Family Communication: no family bedside Disposition Plan: tx to SDU  Consultants:   General surgery  Procedures:   None   Antimicrobials:  None    Subjective: Tachycardiac , chest pain yesterday  Objective: Vitals:   07/07/16 0521 07/07/16 0600 07/07/16 0813 07/07/16 1019  BP:    (!) 141/74  Pulse: (!) 112 (!) 123  (!) 122  Resp:   18 19  Temp:    99.1 F (37.3 C)  TempSrc:    Oral  SpO2:   98% 97%  Weight:      Height:        Intake/Output Summary (Last 24 hours) at 07/07/16 1125 Last data filed at 07/07/16 0908  Gross per 24 hour  Intake          3946.67 ml  Output             2050 ml  Net          1896.67 ml   Filed Weights   07/02/16 1500 07/03/16  0044  Weight: 117.9 kg (260 lb) 123.8 kg (273 lb)    Examination: Constitutional: NAD Vitals:   07/07/16 0521 07/07/16 0600 07/07/16 0813 07/07/16 1019  BP:    (!) 141/74  Pulse: (!) 112 (!) 123  (!) 122  Resp:   18 19  Temp:    99.1 F (37.3 C)  TempSrc:    Oral  SpO2:   98% 97%  Weight:      Height:       Eyes: PERRL, lids and conjunctivae normal ENMT: Mucous membranes are dry Neck: normal, supple, no masses, no thyromegaly Respiratory: clear to auscultation bilaterally, no wheezing, no crackles.  Cardiovascular: Regular rate and rhythm, no murmurs / rubs / gallops.  Abdomen: abdomen with tenderness  throughout. No BS Musculoskeletal: no clubbing / cyanosis.  Skin: no rashes, lesions, ulcers. No induration Neurologic: non focal    Data Reviewed: I have personally reviewed following labs and imaging studies  CBC:  Recent Labs Lab 07/02/16 1650 07/03/16 0627 07/04/16 0318 07/05/16 0532 07/06/16 0553 07/07/16 0712  WBC 10.1 4.5 3.5* 4.6 5.9 7.2  NEUTROABS 8.2* 3.0  --   --   --   --   HGB 15.4* 14.5 13.5 12.8 12.7 14.5  HCT 45.4 44.1 40.7 39.0 38.8 44.5  MCV 91.3 91.9 91.5 92.4 91.7 91.4  PLT 323 307 276 239 252 [id number]   Basic Metabolic Panel:  Recent Labs Lab 07/03/16 0627 07/04/16 0318 07/05/16 0532 07/06/16 0553 07/07/16 0712  NA 136 140 142 143 146*  K 3.5 3.7 3.9 3.6 3.9  CL 102 108 114* 114* 115*  CO2 24 23 21* 21* 23  GLUCOSE 138* 105* 92 82 137*  BUN 21* 28* 27* 20 18  CREATININE 0.87 0.98 0.93 0.88 0.98  CALCIUM 9.1 8.5* 8.6* 8.7* 8.3*   GFR: Estimated Creatinine Clearance: 70.6 mL/min (by C-G formula based on SCr of 0.98 mg/dL). Liver Function Tests:  Recent Labs Lab 07/03/16 0627 07/04/16 0318 07/05/16 0532 07/06/16 0553 07/07/16 0712  AST 72* 37 27 27 25   ALT 111* 70* 50 40 32  ALKPHOS 94 80 73 72 62  BILITOT 1.1 1.0 0.9 0.6 1.3*  PROT 7.2 6.5 6.6 6.6 5.9*  ALBUMIN 3.5 2.9* 2.8* 2.8* 2.2*    Recent Labs Lab 07/02/16 1650 07/03/16 0627 07/04/16 0318  LIPASE 3,627* 627* 61*   No results for input(s): AMMONIA in the last 168 hours. Coagulation Profile:  Recent Labs Lab 07/03/16 0627  INR 1.08   Lipid Profile: No results for input(s): CHOL, HDL, LDLCALC, TRIG, CHOLHDL, LDLDIRECT in the last 72 hours. Urine analysis:    Component Value Date/Time   COLORURINE AMBER (A) 07/03/2016 0721   APPEARANCEUR CLEAR 07/03/2016 0721   LABSPEC >1.046 (H) 07/03/2016 0721   PHURINE 6.0 07/03/2016 0721   GLUCOSEU NEGATIVE 07/03/2016 0721   HGBUR NEGATIVE 07/03/2016 0721   BILIRUBINUR SMALL (A) 07/03/2016 0721   KETONESUR NEGATIVE 07/03/2016  0721   PROTEINUR 100 (A) 07/03/2016 0721   UROBILINOGEN 1.0 04/21/2014 0756   NITRITE NEGATIVE 07/03/2016 0721   LEUKOCYTESUR NEGATIVE 07/03/2016 0721   Radiology Studies: Ct Abdomen Pelvis W Contrast  Result Date: 07/05/2016 CLINICAL DATA:  70 year old female with recently diagnosed small bowel obstruction and pancreatitis. Continued abdominal pelvic pain with nausea. EXAM: CT ABDOMEN AND PELVIS WITH CONTRAST TECHNIQUE: Multidetector CT imaging of the abdomen and pelvis was performed using the standard protocol following bolus administration of intravenous contrast. CONTRAST:  100 cc intravenous Isovue 300 COMPARISON:  07/02/2016 and prior CTs. FINDINGS: Lower chest: Increased moderate bibasilar atelectasis identified. Hepatobiliary: The liver is unremarkable. The patient is status post cholecystectomy. There is no evidence of biliary dilatation. Pancreas: Unremarkable Spleen: Unremarkable Adrenals/Urinary Tract: The kidneys, adrenal glands and bladder are unremarkable except for unchanged 1.7 cm left adrenal adenoma. Stomach/Bowel: An NG tube is present with tip in the mid stomach. Dilated proximal-mid small bowel loops are again identified with collapsed distal small bowel loops, compatible with small bowel obstruction. There has been interval decreased caliber of stomach and dilated small bowel loops since the prior study. There is no evidence of definite bowel wall thickening or pneumoperitoneum. Vascular/Lymphatic: Abdominal aortic atherosclerotic calcifications noted without aneurysm. No enlarged lymph nodes identified. Reproductive: Patient is status post hysterectomy. No adnexal masses identified. Other: No free fluid or focal collection/abscess. Musculoskeletal: Surgical changes within the lumbar spine again identified. No acute or suspicious abnormality. IMPRESSION: Small bowel obstruction again noted, with decompression of stomach and decreased caliber of dilated small bowel loops from NG tube  within the stomach. No evidence of abscess, free fluid or pneumoperitoneum. Increased moderate bibasilar atelectasis. Abdominal aortic atherosclerosis. Electronically Signed   By: Margarette Canada M.D.   On: 07/05/2016 13:26   Dg Chest Port 1 View  Result Date: 07/05/2016 CLINICAL DATA:  Chest pain EXAM: PORTABLE CHEST 1 VIEW COMPARISON:  07/02/2016 FINDINGS: Cardiomediastinal silhouette is stable. Stable atelectasis or scarring right base. There is streaky atelectasis or infiltrate left base. No pulmonary edema. NG tube poorly visualized due to patient's large body habitus. IMPRESSION: No pulmonary edema. Right base atelectasis or scarring. Streaky left basilar atelectasis or infiltrate. Electronically Signed   By: Lahoma Crocker M.D.   On: 07/05/2016 12:28     Scheduled Meds: . chlorhexidine  15 mL Mouth Rinse BID  . cloNIDine  0.1 mg Transdermal Q Tue  . enoxaparin (LOVENOX) injection  60 mg Subcutaneous Q24H  . famotidine (PEPCID) IV  20 mg Intravenous Q12H  . mouth rinse  15 mL Mouth Rinse q12n4p  . morphine   Intravenous Q4H   Continuous Infusions: . dextrose 5 % and 0.45 % NaCl with KCl 20 mEq/L       [facility] Pager [phone]  If 7PM-7AM, please contact night-coverage [email or web address] Password [email or web address] 07/07/2016, 11:25 AM

## 2016-07-07 NOTE — Progress Notes (Signed)
**  Preliminary report by tech**  Bilateral lower extremity venous duplex completed. There is no evidence of deep or superficial vein thrombosis involving the right and left lower extremities. All visualized vessels appear patent and compressible. There is no evidence of Baker's cysts bilaterally.  07/07/16 3:50 PM Amy [name] RVT

## 2016-07-07 NOTE — Op Note (Signed)
NAMEALIKI, SANTIZO NO.:  [id number]  MEDICAL RECORD NO.:  [id number]  LOCATION:  6N30C                        FACILITY:  [facility]  PHYSICIAN:  Leighton Ruff. Redmond Pulling, MD, FACSDATE OF BIRTH:  [birth year]  DATE OF PROCEDURE:  07/06/2016 DATE OF DISCHARGE:                              OPERATIVE REPORT   PREOPERATIVE DIAGNOSIS:  Partial small bowel obstruction.  POSTOPERATIVE DIAGNOSIS:  Partial small bowel obstruction.  PROCEDURE: 1. Diagnostic laparoscopy converted to open exploratory laparotomy. 2. Lysis of adhesions for 3 hours.  SURGEON:  Leighton Ruff. Redmond Pulling, MD, FACS.  ASSISTANT SURGEON:  None.  ANESTHESIA:  General.  EBL:  100 mL.  SPECIMENS:  None.  FINDINGS:  Numerous dense extensive intraabdominal adhesions requiring extensive adhesiolysis.  INDICATIONS FOR PROCEDURE:  The patient is a 70 year old Afro-American female who was admitted on October 9th with a partial small-bowel obstruction.  Small-bowel obstruction protocol was instituted with placement of nasogastric tube and bowel rest, followed by administration of Gastrografin with serial radiographs.  Her bowel obstruction improved radiographically; however, she remained obstipated with high body NG tube output.  A repeat CAT scan was performed since on her plain films her bowel gas pattern was normal.  Nonetheless, her repeat CT scan demonstrated ongoing partial small-bowel obstruction.  Today, she remained obstipated with over 2 L brown NG tube output.  Based on this, I did not think she would resolve without surgery.  I discussed the risks and benefits of surgery extensively with the patient and her daughter.  Please see notes for that description.  She had a prior hysterectomy with an incision going above the umbilicus as well as a cholecystectomy.  We discussed attempting laparoscopic evaluation with high probability conversion to open.  DESCRIPTION OF PROCEDURE:  She was taken to the OR-1 and  placed supine on the operating room table.  General endotracheal anesthesia was established.  Sequential compression devices were placed.  A Foley catheter was placed.  Her left arm was tucked at her side with the appropriate padding.  Her abdomen was prepped and draped in usual standard surgical fashion with ChloraPrep.  A surgical time-out was performed.  She received IV antibiotics prior to skin incision.  I made a small incision in the left upper quadrant near the subcostal margin out laterally.  Then, using a 0 degree 5-mm laparoscope through a 5-mm trocar, advanced it through all layers of the abdominal wall to enter the abdominal cavity.  Pneumoperitoneum was smoothly established up to a patient pressure of 15 mmHg.  Laparoscope was advanced and the abdominal cavity was surveilled.  There was no evidence of injury to surrounding structures.  There were numerous adhesions to the anterior abdominal wall.  There were omental adhesions in the upper abdomen as well as small bowel adhesions in the midline to her previously placed midline suture.  I was able to get another 5 mm trocar in the left lateral abdomen under direct visualization.  I then took down some of these omental adhesions with Endo-Shears.  I started taking down some of the midline adhesions of the small bowel.  I was able to get another trocar in the left lower quadrant after bluntly peeling down some of the small bowel from the anterior abdominal  wall in the left lower quadrant.  Some of the adhesions were dense and some were just able to be gently pulled down.  I was able to free the midline adhesions after about 25 to 30 minutes, but then trying to look at the small bowel.  There was just dense interloop adhesions with omentum stuck to some of the proximal small bowel.  At this point, I did not feel it was safe to proceed with laparoscopic, I decided to convert to open.  I incised her old scar and excised it and then  extended the incision in the upper midline.  The subcutaneous tissue was divided with electrocautery and the fascia was divided.  Some of the old fascial sutures that were divided were all removed.  It still took a while to actually get all the omentum and small bowel taken down from the abdominal wall out laterally.  It was also enveloped by her peritoneum for lack of better description.  This was done sharply with Metzenbaum scissors.  At this point, I was able to place a self-retaining Balfour retractor.  Identified a loop of dilated proximal bowel to try to follow it distally to the point of obstruction, but there were numerous interloop adhesions that I had to take down in order to follow the dilated loop.  I then tried to come distally from the area of decompressed distal small bowel and run it proximally; however, came across again numerous dense interloop adhesions.  So this just commenced 2-1/2 hour adhesiolysis using a combination of right angle Metzenbaum scissors and occasionally electrocautery where it was omentum stuck.  I was able to free up the entire small bowel, but we did end up having to place a Bookwalter retractor, so my assistant could assist better.  After extensive adhesiolysis, I ran the bowel 4 times. There were 2 areas in the proximal small bowel that had a small serosal tear.  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  These 2 small serosal tears were repaired with interrupted 3-0 silk sutures.  I confirmed placement of the nasogastric tube in the stomach.  I ran the bowel one last time, and there was no other evidence of injury to the surrounding structures. The colon was left aligned as it was still adhered laterally on both sides of the abdomen.  The abdomen was irrigated with a L of saline.  I then placed Seprafilm in the midline and then closed midline fascia with a running looped PDS #1 one from above and one  from below.  The subcutaneous tissue was irrigated and the skin was closed with skin staples.  The 3 trocar sites were closed with skin staples as well.  A Prevena incisional wound VAC was then placed over the midline given her morbid obesity to help prevent wound infection.  All needle, instrument, and sponge counts were correct x2.  There were no immediate complications.  The patient tolerated procedure well and she was taken to recovery room in stable condition.     Leighton Ruff. Redmond Pulling, MD, FACS     EMW/MEDQ  D:  07/06/2016  T:  07/07/2016  Job:  [id number]

## 2016-07-07 NOTE — Progress Notes (Signed)
Pt transferred to 3S09 from radiology, but pt was in room 6N30.  Pt AAO X4.  Pt on 2L O2 via [place] and has NGT to rt nare clamped.  Pt has MLA incision with wound vac and 3 sites on the left with gauze and transparent dressing.  Foley intact draining amber urine.  Report given to [place], Therapist, sports.  Family aware of pt transferring.

## 2016-07-08 ENCOUNTER — Encounter (HOSPITAL_COMMUNITY): Payer: Self-pay | Admitting: General Surgery

## 2016-07-08 ENCOUNTER — Inpatient Hospital Stay (HOSPITAL_COMMUNITY): Payer: Medicare Other

## 2016-07-08 DIAGNOSIS — K5649 Other impaction of intestine: Secondary | ICD-10-CM

## 2016-07-08 DIAGNOSIS — R079 Chest pain, unspecified: Secondary | ICD-10-CM

## 2016-07-08 LAB — ECHOCARDIOGRAM COMPLETE
E decel time: 132 msec
E/e' ratio: 11.65
FS: 26 % — AB (ref 28–44)
Height: 65 in
IVS/LV PW RATIO, ED: 1.31
LA ID, A-P, ES: 25 mm
LA diam end sys: 25 mm
LA diam index: 1.11 cm/m2
LA vol A4C: 39.6 ml
LA vol index: 18.9 mL/m2
LA vol: 42.7 mL
LV E/e' medial: 11.65
LV E/e'average: 11.65
LV PW d: 8.63 mm — AB (ref 0.6–1.1)
LV e' LATERAL: 8.05 cm/s
LVOT SV: 73 mL
LVOT VTI: 28.9 cm
LVOT area: 2.54 cm2
LVOT diameter: 18 mm
LVOT peak grad rest: 8 mmHg
LVOT peak vel: 138 cm/s
Lateral S' vel: 11.7 cm/s
MV Dec: 132
MV Peak grad: 4 mmHg
MV pk A vel: 134 m/s
MV pk E vel: 93.8 m/s
TAPSE: 15.9 mm
TDI e' lateral: 8.05
TDI e' medial: 6.42
Weight: 4368 oz

## 2016-07-08 LAB — CBC
HCT: 38.5 % (ref 36.0–46.0)
Hemoglobin: 13.1 g/dL (ref 12.0–15.0)
MCH: 30.8 pg (ref 26.0–34.0)
MCHC: 34 g/dL (ref 30.0–36.0)
MCV: 90.4 fL (ref 78.0–100.0)
Platelets: 232 10*3/uL (ref 150–400)
RBC: 4.26 MIL/uL (ref 3.87–5.11)
RDW: 16.4 % — ABNORMAL HIGH (ref 11.5–15.5)
WBC: 11.3 10*3/uL — ABNORMAL HIGH (ref 4.0–10.5)

## 2016-07-08 LAB — BASIC METABOLIC PANEL
Anion gap: 8 (ref 5–15)
BUN: 21 mg/dL — ABNORMAL HIGH (ref 6–20)
CO2: 19 mmol/L — ABNORMAL LOW (ref 22–32)
Calcium: 8.2 mg/dL — ABNORMAL LOW (ref 8.9–10.3)
Chloride: 116 mmol/L — ABNORMAL HIGH (ref 101–111)
Creatinine, Ser: 0.81 mg/dL (ref 0.44–1.00)
GFR calc Af Amer: >60 mL/min (ref >60)
GFR calc non Af Amer: >60 mL/min (ref >60)
Glucose, Bld: 144 mg/dL — ABNORMAL HIGH (ref 65–99)
Potassium: 4.4 mmol/L (ref 3.5–5.1)
Sodium: 143 mmol/L (ref 135–145)

## 2016-07-08 MED ORDER — PERFLUTREN LIPID MICROSPHERE
1.0000 mL | INTRAVENOUS | Status: AC | PRN
  Administered 2016-07-08: 2 mL via INTRAVENOUS
  Filled 2016-07-08: qty 10

## 2016-07-08 MED ORDER — METOPROLOL TARTRATE 5 MG/5ML IV SOLN
5.0000 mg | INTRAVENOUS | Status: DC | PRN
  Administered 2016-07-11: 5 mg via INTRAVENOUS
  Filled 2016-07-08: qty 5

## 2016-07-08 MED ORDER — PERFLUTREN LIPID MICROSPHERE
INTRAVENOUS | Status: AC
  Administered 2016-07-08: 2 mL via INTRAVENOUS
  Filled 2016-07-08: qty 10

## 2016-07-08 MED ORDER — DEXTROSE-NACL 5-0.45 % IV SOLN
INTRAVENOUS | Status: DC
  Administered 2016-07-08: 75 mL/h via INTRAVENOUS
  Administered 2016-07-09 (×2): via INTRAVENOUS

## 2016-07-08 NOTE — Progress Notes (Addendum)
PROGRESS NOTE  Amy [name] [id number] DOB: 05-Nov-1945 DOA: 07/02/2016 PCP: Blanchie Serve, MD   LOS: 6 days   Brief Narrative: Amy [name] is a 70 y.o. woman with a history of HTN, GERD, DVT (she is not on anticoagulation currently), and chronic pain who presents to the ED for evaluation of abdominal pain x 5 five days.  She does not recall a specific trigger.  She is not sure that food has made it worse because she has had decreased PO intake.  Within the past 24 hours, she has developed nausea and vomiting. She was also found to have an elevated lipase, CT scan showed small bowel obstruction. General surgery was consulted and patient was admitted to medicine service  Assessment & Plan: Principal Problem:   SBO (small bowel obstruction) Active Problems:   Essential hypertension   Allergic rhinitis   GERD (gastroesophageal reflux disease)   Chronic pain   Pancreatitis   Chest pain,sinus tachycardia HR 120'130's at rest, started on prn iv  metoprolol Left sided CP, last night, troponin 0.03, hypertensive into 180's post op [id number]   continue step down, prn metoprolol for HR >110,  CT PE protocol, DVT study to r/o PE/DVT-negative  Monitor closely to see if troponin trends up, ECHO pending Appears stable from a cardiac standpoint    Small bowel obstruction - Possibly related to adhesions related to previous surgeries, she has had history of the SBO in the past requiring LOA  - Gen. surgery consulted, appreciate input,   - Continue  nothing by mouth, continue IV fluids, analgesics and antiemetics,high NGT output   repeat CT scan on 10/12 showed small bowel obstruction with decompression of the stomach, pancreas appear normal  s/p LAPAROSCOPY DIAGNOSTIC EMERGENT TO OPEN (N/A) 10/13 EXPLORATORY LAPAROTOMY LYSIS OF ADHESIONS  . Hemoglobin stable    Acute pancreatitis/ elevated lipase -initial  CT scan however shows no significant pancreatic changes,  abdominal ultrasound with minimal soft tissue inflammation around the pancreatic head concerning for mild acute pancreatitis, significant elevation of lipase could also be related to #1  It is possible to see lipase elevation in small bowel obstruction       HTN - continue home medications with clonidine patch, blood pressure controlled at 128/85 Continue telemetry, when necessary hydralazine  GERD - pepcid  Elevated LFTs - related to ?pancreatitis, she is s/p cholecystectomy. LFs and lipase improving.  - CBD without dilatation so less likely choledocholithiasis,    DVT prophylaxis: Lovenox Code Status: DNR Family Communication: no family bedside Disposition Plan:  Cont  SDU  Consultants:   General surgery  Procedures:   None   Antimicrobials:  None    Subjective:  Patient doesn't appear too motivated unfortunately , she is still in bed and has been , encouraged her to get OOB to chair   Objective: Vitals:   07/08/16 0405 07/08/16 0416 07/08/16 0714 07/08/16 0800  BP: 111/73  127/72   Pulse: (!) 107  (!) 108   Resp: 17 (!) 24 18 (!) 22  Temp: 97.6 F (36.4 C)  98.3 F (36.8 C)   TempSrc: Oral  Oral   SpO2: 97% 99% 97% 97%  Weight:      Height:        Intake/Output Summary (Last 24 hours) at 07/08/16 0908 Last data filed at 07/08/16 0800  Gross per 24 hour  Intake          1813.34 ml  Output  925 ml  Net           888.34 ml   Filed Weights   07/02/16 1500 07/03/16 0044  Weight: 117.9 kg (260 lb) 123.8 kg (273 lb)    Examination: Constitutional: NAD Vitals:   07/08/16 0405 07/08/16 0416 07/08/16 0714 07/08/16 0800  BP: 111/73  127/72   Pulse: (!) 107  (!) 108   Resp: 17 (!) 24 18 (!) 22  Temp: 97.6 F (36.4 C)  98.3 F (36.8 C)   TempSrc: Oral  Oral   SpO2: 97% 99% 97% 97%  Weight:      Height:       Eyes: PERRL, lids and conjunctivae normal ENMT: Mucous membranes are dry Neck: normal, supple, no masses, no  thyromegaly Respiratory: clear to auscultation bilaterally, no wheezing, no crackles.  Cardiovascular: Regular rate and rhythm, no murmurs / rubs / gallops.  Abdomen: abdomen with tenderness throughout. No BS Musculoskeletal: no clubbing / cyanosis.  Skin: no rashes, lesions, ulcers. No induration Neurologic: non focal    Data Reviewed: I have personally reviewed following labs and imaging studies  CBC:  Recent Labs Lab 07/02/16 1650 07/03/16 0627 07/04/16 0318 07/05/16 0532 07/06/16 0553 07/07/16 0712 07/08/16 0554  WBC 10.1 4.5 3.5* 4.6 5.9 7.2 11.3*  NEUTROABS 8.2* 3.0  --   --   --   --   --   HGB 15.4* 14.5 13.5 12.8 12.7 14.5 13.1  HCT 45.4 44.1 40.7 39.0 38.8 44.5 38.5  MCV 91.3 91.9 91.5 92.4 91.7 91.4 90.4  PLT 323 307 276 239 252 279 [id number]   Basic Metabolic Panel:  Recent Labs Lab 07/04/16 0318 07/05/16 0532 07/06/16 0553 07/07/16 0712 07/08/16 0554  NA 140 142 143 146* 143  K 3.7 3.9 3.6 3.9 4.4  CL 108 114* 114* 115* 116*  CO2 23 21* 21* 23 19*  GLUCOSE 105* 92 82 137* 144*  BUN 28* 27* 20 18 21*  CREATININE 0.98 0.93 0.88 0.98 0.81  CALCIUM 8.5* 8.6* 8.7* 8.3* 8.2*   GFR: Estimated Creatinine Clearance: 85.4 mL/min (by C-G formula based on SCr of 0.81 mg/dL). Liver Function Tests:  Recent Labs Lab 07/03/16 0627 07/04/16 0318 07/05/16 0532 07/06/16 0553 07/07/16 0712  AST 72* 37 27 27 25   ALT 111* 70* 50 40 32  ALKPHOS 94 80 73 72 62  BILITOT 1.1 1.0 0.9 0.6 1.3*  PROT 7.2 6.5 6.6 6.6 5.9*  ALBUMIN 3.5 2.9* 2.8* 2.8* 2.2*    Recent Labs Lab 07/02/16 1650 07/03/16 0627 07/04/16 0318  LIPASE 3,627* 627* 61*   No results for input(s): AMMONIA in the last 168 hours. Coagulation Profile:  Recent Labs Lab 07/03/16 0627  INR 1.08   Lipid Profile: No results for input(s): CHOL, HDL, LDLCALC, TRIG, CHOLHDL, LDLDIRECT in the last 72 hours. Urine analysis:    Component Value Date/Time   COLORURINE AMBER (A) 07/03/2016 0721    APPEARANCEUR CLEAR 07/03/2016 0721   LABSPEC >1.046 (H) 07/03/2016 0721   PHURINE 6.0 07/03/2016 0721   GLUCOSEU NEGATIVE 07/03/2016 0721   HGBUR NEGATIVE 07/03/2016 0721   BILIRUBINUR SMALL (A) 07/03/2016 0721   KETONESUR NEGATIVE 07/03/2016 0721   PROTEINUR 100 (A) 07/03/2016 0721   UROBILINOGEN 1.0 04/21/2014 0756   NITRITE NEGATIVE 07/03/2016 0721   LEUKOCYTESUR NEGATIVE 07/03/2016 0721   Radiology Studies: Ct Angio Chest Pe W Or Wo Contrast  Result Date: 07/07/2016 CLINICAL DATA:  Exploratory laparotomy yesterday. Shortness of breath. Evaluate for pulmonary embolus. EXAM:  CT ANGIOGRAPHY CHEST WITH CONTRAST TECHNIQUE: Multidetector CT imaging of the chest was performed using the standard protocol during bolus administration of intravenous contrast. Multiplanar CT image reconstructions and MIPs were obtained to evaluate the vascular anatomy. CONTRAST:  100 cc Isovue 370 COMPARISON:  None. FINDINGS: Cardiovascular: Heart size upper normal. No pericardial effusion. No thoracic aortic aneurysm. Assessment of segmental and subsegmental pulmonary arteries to the lower lobes is degraded by respiratory motion, but within this limitation there is no CT evidence for an acute pulmonary embolus. Mediastinum/Nodes: No mediastinal lymphadenopathy. There is no hilar lymphadenopathy. There is no axillary lymphadenopathy. NG tube passes into the stomach with nonvisualization of the distal tip. Lungs/Pleura: Bilateral lower lobe subsegmental atelectasis noted. Focal bandlike opacity in the lingula likely also atelectatic. 7 mm ground-glass nodule identified right lower lobe (image 59 series 5). No evidence for pneumothorax. No pleural effusion. Upper Abdomen: Stomach is fluid distended despite the presence of an NG tube. Musculoskeletal: Bone windows reveal no worrisome lytic or sclerotic osseous lesions. Review of the MIP images confirms the above findings. IMPRESSION: 1. No CT evidence for acute pulmonary  embolus. 2. Bilateral lower lobe subsegmental atelectasis with probable lingular atelectasis associated. 3. Stomach is fluid distended despite the presence of an NG tube. Electronically Signed   By: Misty Stanley M.D.   On: 07/07/2016 14:31     Scheduled Meds: . acetaminophen  1,000 mg Intravenous Q6H  . chlorhexidine  15 mL Mouth Rinse BID  . cloNIDine  0.1 mg Transdermal Q Tue  . enoxaparin (LOVENOX) injection  60 mg Subcutaneous Q24H  . famotidine (PEPCID) IV  20 mg Intravenous Q12H  . mouth rinse  15 mL Mouth Rinse q12n4p  . morphine   Intravenous Q4H   Continuous Infusions: . dextrose 5 % and 0.45% NaCl       [facility] Pager [phone]  If 7PM-7AM, please contact night-coverage [email or web address] Password [email or web address] 07/08/2016, 9:08 AM

## 2016-07-08 NOTE — Progress Notes (Signed)
  Echocardiogram 2D Echocardiogram has been performed.  Jennette Dubin 07/08/2016, 12:01 PM

## 2016-07-08 NOTE — Progress Notes (Signed)
Progress Note: General Surgery Service   Subjective: Some pain, somnolent this morning but arousable  Objective: Vital signs in last 24 hours: Temp:  [97.6 F (36.4 C)-98.8 F (37.1 C)] 98.3 F (36.8 C) (10/15 0714) Pulse Rate:  [107-121] 108 (10/15 0714) Resp:  [15-24] 22 (10/15 0800) BP: (111-127)/(69-73) 127/72 (10/15 0714) SpO2:  [97 %-99 %] 97 % (10/15 0800) FiO2 (%):  [98 %] 98 % (10/14 1600) Last BM Date: 07/04/16  Intake/Output from previous day: 10/14 0701 - 10/15 0700 In: 1893.3 [I.V.:1563.3; NG/GT:30; IV Piggyback:300] Out: 925 [Urine:625; Emesis/NG output:300] Intake/Output this shift: Total I/O In: 0  Out: 100 [Urine:100]  Lungs: CTAB  Cardiovascular: tachycardic  Abd: soft, ATTP, vac in place  Extremities: no edema  Neuro: somnolent but easily arousable  Lab Results: CBC   Recent Labs  07/07/16 0712 07/08/16 0554  WBC 7.2 11.3*  HGB 14.5 13.1  HCT 44.5 38.5  PLT 279 232   BMET  Recent Labs  07/07/16 0712 07/08/16 0554  NA 146* 143  K 3.9 4.4  CL 115* 116*  CO2 23 19*  GLUCOSE 137* 144*  BUN 18 21*  CREATININE 0.98 0.81  CALCIUM 8.3* 8.2*   PT/INR No results for input(s): LABPROT, INR in the last 72 hours. ABG No results for input(s): PHART, HCO3 in the last 72 hours.  Invalid input(s): PCO2, PO2  Studies/Results:  Anti-infectives: Anti-infectives    Start     Dose/Rate Route Frequency Ordered Stop   07/06/16 1600  ciprofloxacin (CIPRO) IVPB 400 mg     400 mg 200 mL/hr over 60 Minutes Intravenous To [facility] 07/06/16 0956 07/06/16 1254      Medications: Scheduled Meds: . acetaminophen  1,000 mg Intravenous Q6H  . chlorhexidine  15 mL Mouth Rinse BID  . cloNIDine  0.1 mg Transdermal Q Tue  . enoxaparin (LOVENOX) injection  60 mg Subcutaneous Q24H  . famotidine (PEPCID) IV  20 mg Intravenous Q12H  . mouth rinse  15 mL Mouth Rinse q12n4p  . morphine   Intravenous Q4H   Continuous Infusions: . dextrose 5  % and 0.45% NaCl 75 mL/hr (07/08/16 0928)   PRN Meds:.acetaminophen **OR** acetaminophen, diphenhydrAMINE **OR** diphenhydrAMINE, hydrALAZINE, menthol-cetylpyridinium, metoprolol, naloxone **AND** sodium chloride flush, ondansetron (ZOFRAN) IV, perflutren lipid microspheres (DEFINITY) IV suspension, promethazine  Assessment/Plan: Patient Active Problem List   Diagnosis Date Noted  . Pancreatitis 07/02/2016  . SBO (small bowel obstruction) 07/02/2016  . Long term current use of opiate analgesic 06/26/2016  . Long term prescription opiate use 06/26/2016  . Opiate use 06/26/2016  . Encounter for therapeutic drug level monitoring 06/26/2016  . Encounter for pain management planning 06/26/2016  . Chronic pain 06/26/2016  . Disturbance of skin sensation 06/26/2016  . Chronic low back pain (Location of Primary Source of Pain) (Bilateral) (R>L) 06/26/2016  . Chronic hip pain (Location of Secondary source of pain) (Bilateral) (R>L) 06/26/2016  . 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 Chronic shoulder pain (Location of Tertiary source of pain) (Bilateral) (R>L) 06/26/2016  . 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 Chronic knee pain (Bilateral) (R>L) 06/26/2016  . 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 Chronic neck pain (Right) 06/26/2016  . Lumbar spondylosis 06/26/2016  . Lumbar facet syndrome 06/26/2016  . Lumbar facet hypertrophy 06/26/2016  . 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 Neurogenic pain 06/26/2016  . Occipital headaches 06/26/2016  . 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  History of lumbar fusion 06/26/2016  . 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  . Neuropathic pain of both legs 10/13/2015  . Constipation 10/13/2015  . Complete rotator cuff tear of left shoulder 05/05/2014  . Depression 08/06/2013  . Hypokalemia 08/06/2013  . GERD (gastroesophageal reflux disease) 08/06/2013   . Spinal stenosis of lumbar region 08/06/2013  . Bladder spasm 08/06/2013  . Obesity, Class III, BMI 40-49.9 (morbid obesity) ([place]) 02/03/2013  . Lipoma of buttock s/p excision 02/20/2013 02/03/2013  . DYSPNEA 01/23/2008  . OBSTRUCTIVE SLEEP APNEA 09/12/2007  . Essential hypertension 09/12/2007  . Allergic rhinitis 09/12/2007  . SLEEPINESS 09/12/2007  . ANGINA, HX OF 09/12/2007   s/p Procedure(s): LAPAROSCOPY DIAGNOSTIC EMERGENT TO OPEN EXPLORATORY LAPAROTOMY LYSIS OF ADHESIONS FOR 3 HOURS 07/06/2016 -NG with minimal output, no nausea - will continue NG tube one more day -somnolence - PCA not being pushed, likely from post surgical healing, recommend continued Step down for monitoring, ensure no fentanyl patches in place -no leukocytosis -await return bowel function   LOS: 6 days   Mickeal Skinner, MD Pg# [phone] [facility]

## 2016-07-08 NOTE — Progress Notes (Signed)
PCA history cleared.  Received 3 mg in last time period between clears of history.  Patient slow to respond and unwilling to press PCA button.

## 2016-07-08 NOTE — Progress Notes (Signed)
-   Normal Vitamin B-12 levels are between 211 and 946 pg/mL, for our Lab. Medical conditions that can increase levels of vitamin B12 include liver disease, kidney failure and myeloproliferative disorders, which includes myelocytic leukemia and polycythemia vera.

## 2016-07-08 NOTE — Progress Notes (Signed)
-  A normal sedimentation rate should be below 30 mm/hr. The sed rate is an acute phase reactant that indirectly measures the degree of inflammation present in the body. It can be acute, developing rapidly after trauma, injury or infection, for example, or can occur over an extended time (chronic) with conditions such as autoimmune diseases or cancer. The ESR is not diagnostic; it is a non-specific, screening test that may be elevated in a number of these different conditions. It provides general information about the presence or absence of an inflammatory condition. 

## 2016-07-08 NOTE — Progress Notes (Signed)
-   Normal Magnesium levels are between 1.6 and 2.3 mEq/L (1.7 to 2.3 mg/dL). Approximately 30% of total plasma magnesium is protein-bound. The most common cause of hypermagnesemia is renal disease. High levels may indicate: adrenal suppression (Addison disease); chronic renal failure; dehydration; or diabetic ketoacidosis.

## 2016-07-09 LAB — GLUCOSE, CAPILLARY: Glucose-Capillary: 155 mg/dL — ABNORMAL HIGH (ref 65–99)

## 2016-07-09 MED ORDER — MORPHINE SULFATE (PF) 2 MG/ML IV SOLN
2.0000 mg | INTRAVENOUS | Status: DC | PRN
  Administered 2016-07-09 – 2016-07-11 (×8): 2 mg via INTRAVENOUS
  Filled 2016-07-09 (×2): qty 1
  Filled 2016-07-09: qty 2
  Filled 2016-07-09 (×4): qty 1

## 2016-07-09 MED ORDER — MENTHOL 3 MG MT LOZG: 1.0000 | LOZENGE | OROMUCOSAL | Status: DC | PRN

## 2016-07-09 NOTE — Progress Notes (Signed)
PROGRESS NOTE  Amy [name] [id number] DOB: 1946/08/08 DOA: 07/02/2016 PCP: Blanchie Serve, MD   LOS: 7 days   Brief Narrative: Amy [name] is a 70 y.o. woman with a history of HTN, GERD, DVT (she is not on anticoagulation currently), and chronic pain who presents to the ED for evaluation of abdominal pain x 5 five days.  She does not recall a specific trigger.  She is not sure that food has made it worse because she has had decreased PO intake.  Within the past 24 hours, she has developed nausea and vomiting. She was also found to have an elevated lipase, CT scan showed small bowel obstruction. General surgery was consulted and patient was admitted to medicine service  Assessment & Plan: Principal Problem:   SBO (small bowel obstruction) Active Problems:   Essential hypertension   Allergic rhinitis   GERD (gastroesophageal reflux disease)   Chronic pain   Pancreatitis   Chest pain,sinus tachycardia HR 120'130's at rest, started on prn iv  metoprolol, still has intermittent tachycardia Left sided CP, last night, troponin 0.03>0.06,    continue step down, prn metoprolol for HR >110,  CT PE protocol, DVT study to r/o PE/DVT-negative  Monitor closely to see if troponin trends up, ECHO  shows EF of 60-65%, wall motion is normal, grade 1 diastolic dysfunction, cardiopulmonary status, stable so far    Small  bowel obstruction - Possibly related to adhesions related to previous surgeries, she has had history of the SBO in the past requiring LOA  - Gen. surgery consulted, appreciate input,   - Continue  nothing by mouth, continue IV fluids, analgesics and antiemetics,high NGT output   repeat CT scan on 10/12 showed small bowel obstruction with decompression of the stomach, pancreas appear normal  s/p LAPAROSCOPY DIAGNOSTIC EMERGENT TO OPEN (N/A) 10/13 EXPLORATORY LAPAROTOMY LYSIS OF ADHESIONS  . Hemoglobin stable  patient still on  PCA-pain management by  surgery  No bowel activity   Acute pancreatitis/ elevated lipase -initial  CT scan however shows no significant pancreatic changes, abdominal ultrasound with minimal soft tissue inflammation around the pancreatic head concerning for mild acute pancreatitis, significant elevation of lipase probably related to small bowel obstruction       HTN - continue home medications with clonidine patch  Continue telemetry, when necessary hydralazine, metoprolol   GERD - pepcid  Elevated LFTs - related to ?pancreatitis, she is s/p cholecystectomy. LFs and lipase improving.  - CBD without dilatation so less likely choledocholithiasis,    DVT prophylaxis: Lovenox Code Status: DNR Family Communication: no family bedside Disposition Plan:  tx to tele   Consultants:   General surgery  Procedures:   None   Antimicrobials:  None    Subjective:  appears groggy and sedated most of the time, now in the chair    Objective: Vitals:   07/08/16 2318 07/09/16 0334 07/09/16 0530 07/09/16 0736  BP:  (!) 153/74  (!) 155/82  Pulse:  (!) 104  (!) 102  Resp: 16 20 16 17   Temp:  98.4 F (36.9 C)  97.9 F (36.6 C)  TempSrc:  Oral  Oral  SpO2: 96% 96% 97% 97%  Weight:      Height:        Intake/Output Summary (Last 24 hours) at 07/09/16 0938 Last data filed at 07/09/16 0900  Gross per 24 hour  Intake             1815 ml  Output  1190 ml  Net              625 ml   Filed Weights   07/02/16 1500 07/03/16 0044  Weight: 117.9 kg (260 lb) 123.8 kg (273 lb)    Examination: Constitutional: NAD Vitals:   07/08/16 2318 07/09/16 0334 07/09/16 0530 07/09/16 0736  BP:  (!) 153/74  (!) 155/82  Pulse:  (!) 104  (!) 102  Resp: 16 20 16 17   Temp:  98.4 F (36.9 C)  97.9 F (36.6 C)  TempSrc:  Oral  Oral  SpO2: 96% 96% 97% 97%  Weight:      Height:       Eyes: PERRL, lids and conjunctivae normal ENMT: Mucous membranes are dry Neck: normal, supple, no masses, no  thyromegaly Respiratory: clear to auscultation bilaterally, no wheezing, no crackles.  Cardiovascular: Regular rate and rhythm, no murmurs / rubs / gallops.  Abdomen: abdomen with tenderness throughout. No BS Musculoskeletal: no clubbing / cyanosis.  Skin: no rashes, lesions, ulcers. No induration Neurologic: non focal    Data Reviewed: I have personally reviewed following labs and imaging studies  CBC:  Recent Labs Lab 07/02/16 1650 07/03/16 0627 07/04/16 0318 07/05/16 0532 07/06/16 0553 07/07/16 0712 07/08/16 0554  WBC 10.1 4.5 3.5* 4.6 5.9 7.2 11.3*  NEUTROABS 8.2* 3.0  --   --   --   --   --   HGB 15.4* 14.5 13.5 12.8 12.7 14.5 13.1  HCT 45.4 44.1 40.7 39.0 38.8 44.5 38.5  MCV 91.3 91.9 91.5 92.4 91.7 91.4 90.4  PLT 323 307 276 239 252 279 [id number]   Basic Metabolic Panel:  Recent Labs Lab 07/04/16 0318 07/05/16 0532 07/06/16 0553 07/07/16 0712 07/08/16 0554  NA 140 142 143 146* 143  K 3.7 3.9 3.6 3.9 4.4  CL 108 114* 114* 115* 116*  CO2 23 21* 21* 23 19*  GLUCOSE 105* 92 82 137* 144*  BUN 28* 27* 20 18 21*  CREATININE 0.98 0.93 0.88 0.98 0.81  CALCIUM 8.5* 8.6* 8.7* 8.3* 8.2*   GFR: Estimated Creatinine Clearance: 85.4 mL/min (by C-G formula based on SCr of 0.81 mg/dL). Liver Function Tests:  Recent Labs Lab 07/03/16 0627 07/04/16 0318 07/05/16 0532 07/06/16 0553 07/07/16 0712  AST 72* 37 27 27 25   ALT 111* 70* 50 40 32  ALKPHOS 94 80 73 72 62  BILITOT 1.1 1.0 0.9 0.6 1.3*  PROT 7.2 6.5 6.6 6.6 5.9*  ALBUMIN 3.5 2.9* 2.8* 2.8* 2.2*    Recent Labs Lab 07/02/16 1650 07/03/16 0627 07/04/16 0318  LIPASE 3,627* 627* 61*   No results for input(s): AMMONIA in the last 168 hours. Coagulation Profile:  Recent Labs Lab 07/03/16 0627  INR 1.08   Lipid Profile: No results for input(s): CHOL, HDL, LDLCALC, TRIG, CHOLHDL, LDLDIRECT in the last 72 hours. Urine analysis:    Component Value Date/Time   COLORURINE AMBER (A) 07/03/2016 0721    APPEARANCEUR CLEAR 07/03/2016 0721   LABSPEC >1.046 (H) 07/03/2016 0721   PHURINE 6.0 07/03/2016 0721   GLUCOSEU NEGATIVE 07/03/2016 0721   HGBUR NEGATIVE 07/03/2016 0721   BILIRUBINUR SMALL (A) 07/03/2016 0721   KETONESUR NEGATIVE 07/03/2016 0721   PROTEINUR 100 (A) 07/03/2016 0721   UROBILINOGEN 1.0 04/21/2014 0756   NITRITE NEGATIVE 07/03/2016 0721   LEUKOCYTESUR NEGATIVE 07/03/2016 0721   Radiology Studies: Ct Angio Chest Pe W Or Wo Contrast  Result Date: 07/07/2016 CLINICAL DATA:  Exploratory laparotomy yesterday. Shortness of breath. Evaluate for  pulmonary embolus. EXAM: CT ANGIOGRAPHY CHEST WITH CONTRAST TECHNIQUE: Multidetector CT imaging of the chest was performed using the standard protocol during bolus administration of intravenous contrast. Multiplanar CT image reconstructions and MIPs were obtained to evaluate the vascular anatomy. CONTRAST:  100 cc Isovue 370 COMPARISON:  None. FINDINGS: Cardiovascular: Heart size upper normal. No pericardial effusion. No thoracic aortic aneurysm. Assessment of segmental and subsegmental pulmonary arteries to the lower lobes is degraded by respiratory motion, but within this limitation there is no CT evidence for an acute pulmonary embolus. Mediastinum/Nodes: No mediastinal lymphadenopathy. There is no hilar lymphadenopathy. There is no axillary lymphadenopathy. NG tube passes into the stomach with nonvisualization of the distal tip. Lungs/Pleura: Bilateral lower lobe subsegmental atelectasis noted. Focal bandlike opacity in the lingula likely also atelectatic. 7 mm ground-glass nodule identified right lower lobe (image 59 series 5). No evidence for pneumothorax. No pleural effusion. Upper Abdomen: Stomach is fluid distended despite the presence of an NG tube. Musculoskeletal: Bone windows reveal no worrisome lytic or sclerotic osseous lesions. Review of the MIP images confirms the above findings. IMPRESSION: 1. No CT evidence for acute pulmonary  embolus. 2. Bilateral lower lobe subsegmental atelectasis with probable lingular atelectasis associated. 3. Stomach is fluid distended despite the presence of an NG tube. Electronically Signed   By: Misty Stanley M.D.   On: 07/07/2016 14:31     Scheduled Meds: . chlorhexidine  15 mL Mouth Rinse BID  . cloNIDine  0.1 mg Transdermal Q Tue  . enoxaparin (LOVENOX) injection  60 mg Subcutaneous Q24H  . famotidine (PEPCID) IV  20 mg Intravenous Q12H  . mouth rinse  15 mL Mouth Rinse q12n4p  . morphine   Intravenous Q4H   Continuous Infusions: . dextrose 5 % and 0.45% NaCl 75 mL/hr at 07/09/16 0600     [facility] Pager [phone]  If 7PM-7AM, please contact night-coverage [email or web address] Password [email or web address] 07/09/2016, 9:38 AM

## 2016-07-09 NOTE — Progress Notes (Signed)
Patient has not voided and denies needing to. Bladder scan done again this time 137 ml. I will continue to monitor.

## 2016-07-09 NOTE — Consult Note (Signed)
   [facility] CM Inpatient Consult   07/09/2016  Amy [name] May 10, 1946 [id number]    Patient screened for [place] Management program. She is eligible for [place] Management services. Spoke with inpatient RNCM. Will engage  patient at a more appropriate time. Currently she is in the stepdown unit.  Marthenia Rolling, MSN-Ed, RN,BSN [facility] Liaison [phone]

## 2016-07-09 NOTE — Care Management Important Message (Signed)
Important Message  Patient Details  Name: Amy [name] MRN: [id number] Date of Birth: [birth year]   Medicare Important Message Given:  Yes    [name], [name] 07/09/2016, 1:44 PM

## 2016-07-09 NOTE — Progress Notes (Signed)
Spoke with wound RN about Prevana wound vac.  Foam will need to be removed 7 days after placement (Oct 20), thrown away, and then future dressing orders per MD instruction.

## 2016-07-09 NOTE — Progress Notes (Signed)
Wasted 68mls/32mg  of morphine with Cherly Beach, RN.

## 2016-07-09 NOTE — Evaluation (Signed)
Physical Therapy Evaluation Patient Details Name: Amy [name] MRN: [id number] DOB: 1946/03/29 Today's Date: 07/09/2016   History of Present Illness  70 y.o. female admitted to [facility] on 07/02/16 for abdominal pain, nausea and vomiting.  Pt found to have a SBO and underwent an exploratory laparotomy with post op wound vac on 07/06/16.  Pt with significant PMHx of DOE, PVD, neuropathy, HTN, h/o DVT, fibromyalgia, and chronic back pain (s/p multiple back surgeries).   Clinical Impression  Pt is very slow to move, but was able to get out into the hallway with RW and assistance.  She will likely need SNF for rehab unless she can get to mod I level before d/c home as she lives with family, but daughter is not physically able to help and husband works.   PT to follow acutely for deficits listed below.   HR 111 during gait.     Follow Up Recommendations SNF;Supervision/Assistance - 24 hour    Equipment Recommendations  None recommended by PT    Recommendations for Other Services   NA    Precautions / Restrictions Precautions Precautions: Other (comment) Precaution Comments: abdomen incision Required Braces or Orthoses: Other Brace/Splint Other Brace/Splint: wound vac mid abdomen      Mobility  Bed Mobility Overal bed mobility: Needs Assistance Bed Mobility: Rolling;Sidelying to Sit;Sit to Sidelying Rolling: Min assist Sidelying to sit: Mod assist;HOB elevated     Sit to sidelying: Mod assist General bed mobility comments: Mod assist to help support trunk during transition to sit up and to lift legs during transition to lay back down.   Transfers Overall transfer level: Needs assistance Equipment used: Rolling walker (2 wheeled) Transfers: Sit to/from Stand Sit to Stand: Min assist         General transfer comment: Min assist to support trunk during transitions.   Ambulation/Gait Ambulation/Gait assistance: Min assist Ambulation Distance (Feet): 65 Feet Assistive  device: Rolling walker (2 wheeled) Gait Pattern/deviations: Step-through pattern;Shuffle;Trunk flexed Gait velocity: significantly decreased Gait velocity interpretation: <1.8 ft/sec, indicative of risk for recurrent falls General Gait Details: Pt with significantly slow gait speed. Exchanged walker for a taller, wider walker.                      Balance Overall balance assessment: Needs assistance Sitting-balance support: Feet supported;Bilateral upper extremity supported Sitting balance-Leahy Scale: Fair     Standing balance support: Bilateral upper extremity supported Standing balance-Leahy Scale: Poor                               Pertinent Vitals/Pain Pain Assessment: Faces Faces Pain Scale: Hurts even more Pain Location: abdominal with mobility Pain Descriptors / Indicators: Grimacing;Guarding Pain Intervention(s): Limited activity within patient's tolerance;Monitored during session;Repositioned    Home Living Family/patient expects to be discharged to:: Unsure Living Arrangements: Spouse/significant other;Children Available Help at Discharge: Family;Available PRN/intermittently Type of Home: House Home Access: Stairs to enter Entrance Stairs-Rails: Right Entrance Stairs-Number of Steps: 2 Home Layout: One level Home Equipment: Cane - single point;Bedside commode;Shower seat;Other (comment);Walker - 2 wheels (adjustable bed) Additional Comments: Pt reports she had no assistance during the day as spouse works and her daughter has medical issues herself    Prior Function Level of Independence: Needs assistance   Gait / Transfers Assistance Needed: "depends on the day" if she uses walker or cane  Extremity/Trunk Assessment   Upper Extremity Assessment: Generalized weakness           Lower Extremity Assessment: Generalized weakness      Cervical / Trunk Assessment: Other exceptions  Communication   Communication: No  difficulties  Cognition Arousal/Alertness: Lethargic Behavior During Therapy: WFL for tasks assessed/performed Overall Cognitive Status: Impaired/Different from baseline Area of Impairment: Attention;Following commands;Problem solving   Current Attention Level: Sustained Memory: Decreased short-term memory Following Commands: Follows one step commands consistently     Problem Solving: Slow processing;Decreased initiation;Difficulty sequencing;Requires verbal cues;Requires tactile cues General Comments: "Do you see that rat" pt pointing towards the door.     General Comments General comments (skin integrity, edema, etc.): total assist for peri care after toileting.         Assessment/Plan    PT Assessment Patient needs continued PT services  PT Problem List Decreased strength;Decreased activity tolerance;Decreased balance;Decreased mobility;Decreased knowledge of use of DME;Decreased cognition;Decreased knowledge of precautions;Obesity;Pain          PT Treatment Interventions DME instruction;Gait training;Stair training;Functional mobility training;Therapeutic activities;Therapeutic exercise;Balance training;Neuromuscular re-education;Cognitive remediation;Patient/family education    PT Goals (Current goals can be found in the Care Plan section)  Acute Rehab PT Goals Patient Stated Goal: pt wants to decrease pain PT Goal Formulation: With patient Time For Goal Achievement: 07/23/16 Potential to Achieve Goals: Good    Frequency Min 3X/week   Barriers to discharge Decreased caregiver support husband works, daughter has physical issues and health issues        End of Session   Activity Tolerance: Patient limited by fatigue;Patient limited by lethargy;Patient limited by pain Patient left: in bed;with call bell/phone within reach;with bed alarm set;with family/visitor present Nurse Communication: Mobility status         Time: [id number] PT Time Calculation (min) (ACUTE  ONLY): 71 min   Charges:   PT Evaluation $PT Eval Moderate Complexity: 1 Procedure PT Treatments $Gait Training: 8-22 mins $Therapeutic Activity: 23-37 mins        Rebecca B. Medendorp, PT, DPT [phone]   07/09/2016, 11:56 PM

## 2016-07-09 NOTE — Progress Notes (Signed)
[facility] Office:  [phone] General Surgery Progress Note   LOS: 7 days  POD -  3 Days Post-Op  Assessment/Plan: 1.  LAPAROSCOPY DIAGNOSTIC EMERGENT TO OPEN EXPLORATORY LAPAROTOMY, LYSIS OF ADHESIONS FOR 3 HOURS - 07/06/2016 - [facility]  Still with NGT - but with prolonged enterolysis, this will stay for a while  No bowel activity  2.  Prevena VAC on wound  Check with wound care as to how long to leave this 3.  HTN 4.  In step down for possible PE - but CT was negative  Can move to floor 5.  DVT prophylaxis - Lovenox   Principal Problem:   SBO (small bowel obstruction) Active Problems:   Essential hypertension   Allergic rhinitis   GERD (gastroesophageal reflux disease)   Chronic pain   Pancreatitis  Subjective:  Acts weak.  Tolerated foley out.    Needs to ambulate more.  Objective:   Vitals:   07/09/16 0530 07/09/16 0736  BP:  (!) 155/82  Pulse:  (!) 102  Resp: 16 17  Temp:  97.9 F (36.6 C)     Intake/Output from previous day:  10/15 0701 - 10/16 0700 In: 1590 [I.V.:1540; IV Piggyback:50] Out: 1290 [Urine:390; Emesis/NG output:900]  Intake/Output this shift:  Total I/O In: 225 [I.V.:225] Out: -    Physical Exam:   General: [name] who is alert and oriented.   Has NGT.  900 cc recoreded last 24 hours.   HEENT: Normal. Pupils equal. .   Lungs: Clear   Abdomen: Quiet   Wound: Prevena wound vac on wound   Lab Results:    Recent Labs  07/07/16 0712 07/08/16 0554  WBC 7.2 11.3*  HGB 14.5 13.1  HCT 44.5 38.5  PLT 279 232    BMET   Recent Labs  07/07/16 0712 07/08/16 0554  NA 146* 143  K 3.9 4.4  CL 115* 116*  CO2 23 19*  GLUCOSE 137* 144*  BUN 18 21*  CREATININE 0.98 0.81  CALCIUM 8.3* 8.2*    PT/INR  No results for input(s): LABPROT, INR in the last 72 hours.  ABG  No results for input(s): PHART, HCO3 in the last 72 hours.  Invalid input(s): PCO2, PO2   Studies/Results:  Ct Angio Chest Pe W Or Wo  Contrast  Result Date: 07/07/2016 CLINICAL DATA:  Exploratory laparotomy yesterday. Shortness of breath. Evaluate for pulmonary embolus. EXAM: CT ANGIOGRAPHY CHEST WITH CONTRAST TECHNIQUE: Multidetector CT imaging of the chest was performed using the standard protocol during bolus administration of intravenous contrast. Multiplanar CT image reconstructions and MIPs were obtained to evaluate the vascular anatomy. CONTRAST:  100 cc Isovue 370 COMPARISON:  None. FINDINGS: Cardiovascular: Heart size upper normal. No pericardial effusion. No thoracic aortic aneurysm. Assessment of segmental and subsegmental pulmonary arteries to the lower lobes is degraded by respiratory motion, but within this limitation there is no CT evidence for an acute pulmonary embolus. Mediastinum/Nodes: No mediastinal lymphadenopathy. There is no hilar lymphadenopathy. There is no axillary lymphadenopathy. NG tube passes into the stomach with nonvisualization of the distal tip. Lungs/Pleura: Bilateral lower lobe subsegmental atelectasis noted. Focal bandlike opacity in the lingula likely also atelectatic. 7 mm ground-glass nodule identified right lower lobe (image 59 series 5). No evidence for pneumothorax. No pleural effusion. Upper Abdomen: Stomach is fluid distended despite the presence of an NG tube. Musculoskeletal: Bone windows reveal no worrisome lytic or sclerotic osseous lesions. Review of the MIP images confirms the above findings.  IMPRESSION: 1. No CT evidence for acute pulmonary embolus. 2. Bilateral lower lobe subsegmental atelectasis with probable lingular atelectasis associated. 3. Stomach is fluid distended despite the presence of an NG tube. Electronically Signed   By: Misty Stanley M.D.   On: 07/07/2016 14:31     Anti-infectives:   Anti-infectives    Start     Dose/Rate Route Frequency Ordered Stop   07/06/16 1600  ciprofloxacin (CIPRO) IVPB 400 mg     400 mg 200 mL/hr over 60 Minutes Intravenous To ShortStay  Surgical 07/06/16 0956 07/06/16 1254      Alphonsa Overall, MD, FACS Pager: [facility] Office: [phone] 07/09/2016

## 2016-07-09 NOTE — Care Management Note (Addendum)
Case Management Note  Patient Details  Name: Amy [name] MRN: [id number] Date of Birth: [birth year]  Subjective/Objective:  Presents with emergent expl lap, has ng tube, and prevena wound vac in place ( will stay in place for severn days thru 10/20 then dressing orders per MD).   NCM spoke with patient,  She states her daughter lives with her, she states  her daughter needs a hip replacement .  Patient has medication coverage and she has  A pcp.  When asked about transportation, patient seemed hesitant and stated that she does not know if her daughter will be able to transport her because she may have a doctors appt for some medical issues she has.  Patient may need transportation assistance at discharge.  Patient states she has had [place] services before with [facility] and [facility], did not want them again,  will await pt/ot eval for recs.  NCM will cont to follow for dc needs.                     Action/Plan:   Expected Discharge Date:                  Expected Discharge Plan:  [place]  In-House Referral:     Discharge planning Services  CM Consult  Post Acute Care Choice:    Choice offered to:     DME Arranged:    DME Agency:     HH Arranged:    [place] Agency:     Status of Service:  In process, will continue to follow  If discussed at Long Length of Stay Meetings, dates discussed:    Additional Comments:  Zenon Mayo, RN 07/09/2016, 1:57 PM

## 2016-07-09 NOTE — Progress Notes (Signed)
Patient not voided attempted to try use bedside commode but not able. I bladder scan her with very little 57-76 ml. I will continue to monitor.

## 2016-07-10 ENCOUNTER — Inpatient Hospital Stay (HOSPITAL_COMMUNITY): Payer: Medicare Other

## 2016-07-10 LAB — COMPREHENSIVE METABOLIC PANEL
ALT: 32 U/L (ref 14–54)
AST: 34 U/L (ref 15–41)
Albumin: 2.3 g/dL — ABNORMAL LOW (ref 3.5–5.0)
Alkaline Phosphatase: 88 U/L (ref 38–126)
Anion gap: 9 (ref 5–15)
BUN: 11 mg/dL (ref 6–20)
CO2: 27 mmol/L (ref 22–32)
Calcium: 8.5 mg/dL — ABNORMAL LOW (ref 8.9–10.3)
Chloride: 108 mmol/L (ref 101–111)
Creatinine, Ser: 0.73 mg/dL (ref 0.44–1.00)
GFR calc Af Amer: >60 mL/min (ref >60)
GFR calc non Af Amer: >60 mL/min (ref >60)
Glucose, Bld: 105 mg/dL — ABNORMAL HIGH (ref 65–99)
Potassium: 3.3 mmol/L — ABNORMAL LOW (ref 3.5–5.1)
Sodium: 144 mmol/L (ref 135–145)
Total Bilirubin: 0.7 mg/dL (ref 0.3–1.2)
Total Protein: 6.8 g/dL (ref 6.5–8.1)

## 2016-07-10 LAB — CBC
HCT: 37 % (ref 36.0–46.0)
Hemoglobin: 12.5 g/dL (ref 12.0–15.0)
MCH: 30.3 pg (ref 26.0–34.0)
MCHC: 33.8 g/dL (ref 30.0–36.0)
MCV: 89.6 fL (ref 78.0–100.0)
Platelets: 291 10*3/uL (ref 150–400)
RBC: 4.13 MIL/uL (ref 3.87–5.11)
RDW: 15.9 % — ABNORMAL HIGH (ref 11.5–15.5)
WBC: 12.2 10*3/uL — ABNORMAL HIGH (ref 4.0–10.5)

## 2016-07-10 LAB — TROPONIN I
Troponin I: <0.03 ng/mL (ref <0.03)
Troponin I: <0.03 ng/mL (ref <0.03)

## 2016-07-10 MED ORDER — MAGIC MOUTHWASH: 5.0000 mL | Freq: Three times a day (TID) | ORAL | Status: DC | PRN

## 2016-07-10 MED ORDER — LEVALBUTEROL HCL 1.25 MG/0.5ML IN NEBU: 1.2500 mg | INHALATION_SOLUTION | Freq: Three times a day (TID) | RESPIRATORY_TRACT | Status: DC | PRN

## 2016-07-10 MED ORDER — LORATADINE 10 MG PO TABS
10.0000 mg | ORAL_TABLET | Freq: Every day | ORAL | Status: DC | PRN
  Administered 2016-07-10: 10 mg via ORAL
  Filled 2016-07-10: qty 1

## 2016-07-10 MED ORDER — SODIUM CHLORIDE 0.9% FLUSH
10.0000 mL | INTRAVENOUS | Status: DC | PRN
  Administered 2016-07-12: 10 mL
  Filled 2016-07-10: qty 40

## 2016-07-10 MED ORDER — FUROSEMIDE 10 MG/ML IJ SOLN
40.0000 mg | Freq: Once | INTRAMUSCULAR | Status: AC
  Administered 2016-07-10: 40 mg via INTRAVENOUS
  Filled 2016-07-10: qty 4

## 2016-07-10 NOTE — Progress Notes (Addendum)
PROGRESS NOTE  Amy [name] [id number] DOB: [birth year] DOA: 07/02/2016 PCP: Blanchie Serve, MD   LOS: 8 days   Brief Narrative: Amy [name] is a 70 y.o. woman with a history of HTN, GERD, DVT (she is not on anticoagulation currently), and chronic pain who presents to the ED for evaluation of abdominal pain x 5 five days.   . She was also found to have an elevated lipase, CT scan showed small bowel obstruction. General surgery was consulted and patient was admitted to medicine service. Patient underwent exploratory laparoscopy on 10/14.   Assessment & Plan: Principal Problem:   SBO (small bowel obstruction) Active Problems:   Essential hypertension   Allergic rhinitis   GERD (gastroesophageal reflux disease)   Chronic pain   Pancreatitis   Chest pain,sinus tachycardia, this has been an intermittent problem HR 120'130's at rest, started on prn iv  metoprolol, still has intermittent tachycardia Left sided CP, last night, troponin 0.03>0.06,    continue step down, prn metoprolol for HR >110,  CT PE protocol, DVT study to r/o PE/DVT-negative  Monitor closely to see if troponin trends up, ECHO  shows EF of 60-65%, wall motion is normal, grade 1 diastolic dysfunction, cardiopulmonary status, stable so far  Discontinued IV fluids, repeat a chest x-ray 10/17 as the patient was complaining of shortness of breath, CXR negative  Started prn Xopenex, although patient does not appear to be hypoxic, 100% on room air.   Anxiety could be playing a role    Small  bowel obstruction - Possibly related to adhesions related to previous surgeries, she has had history of the SBO in the past requiring LOA  - Gen. surgery consulted, appreciate input,   - Continue  nothing by mouth, continue IV fluids, analgesics and antiemetics,high NGT output   repeat CT scan on 10/12 showed small bowel obstruction with decompression of the stomach, pancreas appear normal  s/p LAPAROSCOPY  DIAGNOSTIC EMERGENT TO OPEN (N/A) 10/13 EXPLORATORY LAPAROTOMY LYSIS OF ADHESIONS  .  Has not had any flatus, although NGT output slowing down Encourage ambulation Hemoglobin stable.   Acute pancreatitis/ elevated lipase -initial  CT scan however shows no significant pancreatic changes, abdominal ultrasound with minimal soft tissue inflammation around the pancreatic head concerning for mild acute pancreatitis, significant elevation of lipase probably related to small bowel obstruction       HTN - continue home medications with clonidine patch  Continue telemetry, when necessary hydralazine, metoprolol   GERD - pepcid  Elevated LFTs - related to ?pancreatitis, she is s/p cholecystectomy. LFs and lipase improving.  - CBD without dilatation so less likely choledocholithiasis,  Oropharyngeal discomfort-related to NG tube Will start patient on Magic mouthwash, Chloraseptic spray and apical lozenges not helping   DVT prophylaxis: Lovenox Code Status: DNR Family Communication: no family bedside Disposition Plan:  Continue to monitor on telemetry, anticipated discharge in 3-5 days, will need SNF   Consultants:   General surgery  Procedures:   LAPAROSCOPY DIAGNOSTIC EMERGENT TO OPEN EXPLORATORY LAPAROTOMY, LYSIS OF ADHESIONS FOR 3 HOURS - 07/06/2016 - [facility]. [facility]   Antimicrobials:  None    Subjective: Patient appears to be groggy, does not feel well, describing some abdominal pain, throat pain    Objective: Vitals:   07/09/16 1800 07/09/16 2105 07/10/16 0500 07/10/16 0922  BP: (!) 149/78 (!) 175/87 (!) 170/87 (!) 136/95  Pulse: (!) 108 (!) 111 (!) 101 99  Resp: 18 (!) 21 17 18   Temp: 98.2 F (36.8 C) 98.9  F (37.2 C) 97.8 F (36.6 C) 98.6 F (37 C)  TempSrc: Oral   Oral  SpO2: 95% 98% 96% 100%  Weight:  122 kg (269 lb)    Height:        Intake/Output Summary (Last 24 hours) at 07/10/16 1022 Last data filed at 07/10/16 [id number]  Gross per 24 hour  Intake              1675 ml  Output             1100 ml  Net              575 ml   Filed Weights   07/02/16 1500 07/03/16 0044 07/09/16 2105  Weight: 117.9 kg (260 lb) 123.8 kg (273 lb) 122 kg (269 lb)    Examination: Constitutional: NAD Vitals:   07/09/16 1800 07/09/16 2105 07/10/16 0500 07/10/16 0922  BP: (!) 149/78 (!) 175/87 (!) 170/87 (!) 136/95  Pulse: (!) 108 (!) 111 (!) 101 99  Resp: 18 (!) 21 17 18   Temp: 98.2 F (36.8 C) 98.9 F (37.2 C) 97.8 F (36.6 C) 98.6 F (37 C)  TempSrc: Oral   Oral  SpO2: 95% 98% 96% 100%  Weight:  122 kg (269 lb)    Height:       Eyes: PERRL, lids and conjunctivae normal ENMT: Mucous membranes are dry Neck: normal, supple, no masses, no thyromegaly Respiratory: clear to auscultation bilaterally, no wheezing, no crackles.  Cardiovascular: Regular rate and rhythm, no murmurs / rubs / gallops.  Abdomen: abdomen with tenderness throughout. No BS Musculoskeletal: no clubbing / cyanosis.  Skin: no rashes, lesions, ulcers. No induration Neurologic: non focal    Data Reviewed: I have personally reviewed following labs and imaging studies  CBC:  Recent Labs Lab 07/04/16 0318 07/05/16 0532 07/06/16 0553 07/07/16 0712 07/08/16 0554  WBC 3.5* 4.6 5.9 7.2 11.3*  HGB 13.5 12.8 12.7 14.5 13.1  HCT 40.7 39.0 38.8 44.5 38.5  MCV 91.5 92.4 91.7 91.4 90.4  PLT 276 239 252 279 [id number]   Basic Metabolic Panel:  Recent Labs Lab 07/04/16 0318 07/05/16 0532 07/06/16 0553 07/07/16 0712 07/08/16 0554  NA 140 142 143 146* 143  K 3.7 3.9 3.6 3.9 4.4  CL 108 114* 114* 115* 116*  CO2 23 21* 21* 23 19*  GLUCOSE 105* 92 82 137* 144*  BUN 28* 27* 20 18 21*  CREATININE 0.98 0.93 0.88 0.98 0.81  CALCIUM 8.5* 8.6* 8.7* 8.3* 8.2*   GFR: Estimated Creatinine Clearance: 84.7 mL/min (by C-G formula based on SCr of 0.81 mg/dL). Liver Function Tests:  Recent Labs Lab 07/04/16 0318 07/05/16 0532 07/06/16 0553 07/07/16 0712  AST 37 27 27 25   ALT 70* 50 40 32    ALKPHOS 80 73 72 62  BILITOT 1.0 0.9 0.6 1.3*  PROT 6.5 6.6 6.6 5.9*  ALBUMIN 2.9* 2.8* 2.8* 2.2*    Recent Labs Lab 07/04/16 0318  LIPASE 61*   No results for input(s): AMMONIA in the last 168 hours. Coagulation Profile: No results for input(s): INR, PROTIME in the last 168 hours. Lipid Profile: No results for input(s): CHOL, HDL, LDLCALC, TRIG, CHOLHDL, LDLDIRECT in the last 72 hours. Urine analysis:    Component Value Date/Time   COLORURINE AMBER (A) 07/03/2016 0721   APPEARANCEUR CLEAR 07/03/2016 0721   LABSPEC >1.046 (H) 07/03/2016 0721   PHURINE 6.0 07/03/2016 0721   GLUCOSEU NEGATIVE 07/03/2016 0721   HGBUR NEGATIVE 07/03/2016 [id number]  BILIRUBINUR SMALL (A) 07/03/2016 0721   KETONESUR NEGATIVE 07/03/2016 0721   PROTEINUR 100 (A) 07/03/2016 0721   UROBILINOGEN 1.0 04/21/2014 0756   NITRITE NEGATIVE 07/03/2016 0721   LEUKOCYTESUR NEGATIVE 07/03/2016 [id number]   Radiology Studies: No results found.   Scheduled Meds: . chlorhexidine  15 mL Mouth Rinse BID  . cloNIDine  0.1 mg Transdermal Q Tue  . enoxaparin (LOVENOX) injection  60 mg Subcutaneous Q24H  . famotidine (PEPCID) IV  20 mg Intravenous Q12H  . furosemide  40 mg Intravenous Once  . mouth rinse  15 mL Mouth Rinse q12n4p   Continuous Infusions:     [facility] Pager [phone]  If 7PM-7AM, please contact night-coverage [email or web address] Password [email or web address] 07/10/2016, 10:22 AM

## 2016-07-10 NOTE — Progress Notes (Addendum)
Patient ID: Amy [name], female   DOB: 1946/07/11, 70 y.o.   MRN: [id number]  [facility] Progress Note  4 Days Post-Op  Subjective: No change from yesterday. Continues to complain of abdominal pain and fatigue. No flatus or BM.   Objective: Vital signs in last 24 hours: Temp:  [97.8 F (36.6 C)-98.9 F (37.2 C)] 98.6 F (37 C) (10/17 0922) Pulse Rate:  [99-111] 99 (10/17 0922) Resp:  [16-21] 18 (10/17 0922) BP: (136-175)/(78-95) 136/95 (10/17 0922) SpO2:  [95 %-100 %] 100 % (10/17 0922) Weight:  [269 lb (122 kg)] 269 lb (122 kg) (10/16 2105) Last BM Date: 07/04/16  Intake/Output from previous day: 10/16 0701 - 10/17 0700 In: 1900 [I.V.:1800; IV Piggyback:100] Out: 1100 [Urine:450; Emesis/NG output:250; Drains:400] Intake/Output this shift: No intake/output data recorded.  PE: Gen:  Alert, NAD, pleasant Pulm:  Effort normal Abd: Soft, distended, present but hypoactive BS, midline abdominal wound with vac in place  Lab Results:   Recent Labs  07/08/16 0554  WBC 11.3*  HGB 13.1  HCT 38.5  PLT 232   BMET  Recent Labs  07/08/16 0554  NA 143  K 4.4  CL 116*  CO2 19*  GLUCOSE 144*  BUN 21*  CREATININE 0.81  CALCIUM 8.2*   PT/INR No results for input(s): LABPROT, INR in the last 72 hours. CMP     Component Value Date/Time   NA 143 07/08/2016 0554   NA 140 02/14/2016   K 4.4 07/08/2016 0554   CL 116 (H) 07/08/2016 0554   CO2 19 (L) 07/08/2016 0554   GLUCOSE 144 (H) 07/08/2016 0554   BUN 21 (H) 07/08/2016 0554   BUN 13 02/14/2016   CREATININE 0.81 07/08/2016 0554   CALCIUM 8.2 (L) 07/08/2016 0554   PROT 5.9 (L) 07/07/2016 0712   ALBUMIN 2.2 (L) 07/07/2016 0712   AST 25 07/07/2016 0712   ALT 32 07/07/2016 0712   ALKPHOS 62 07/07/2016 0712   BILITOT 1.3 (H) 07/07/2016 0712   GFRNONAA >60 07/08/2016 0554   GFRAA >60 07/08/2016 0554   Lipase     Component Value Date/Time   LIPASE 61 (H) 07/04/2016 0318        Studies/Results: Dg Chest Port 1 View  Result Date: 07/10/2016 CLINICAL DATA:  Shortness of breath, hypertension, asthma EXAM: PORTABLE CHEST 1 VIEW COMPARISON:  07/07/2016 FINDINGS: Cardiomediastinal silhouette is stable. Mild basilar atelectasis. No pulmonary edema. NG tube in place. Linear atelectasis or infiltrate in lingula. IMPRESSION: No pulmonary edema. Mild basilar atelectasis. Linear atelectasis or infiltrate in lingula. Electronically Signed   By: Lahoma Crocker M.D.   On: 07/10/2016 11:07    Anti-infectives: Anti-infectives    Start     Dose/Rate Route Frequency Ordered Stop   07/06/16 1600  ciprofloxacin (CIPRO) IVPB 400 mg     400 mg 200 mL/hr over 60 Minutes Intravenous To [facility] 07/06/16 0956 07/06/16 1254       Assessment/Plan Diagnostic laparoscopy converted to open exploratory laparotomy, Lysis of adhesions for 3 hours - 07/06/2016 E. [facility] - POD 4 - NGT 250/24hr - Prevena wound vac in place >> per WOC this stays in place x7 days and then gets removed and thrown away. - no flatus  -expected prolonged ileus secondary to extensive enterolysis  HTN GERD Chronic pain Needs to ambulate more -   unfortunately, PT only coming by every other day  VTE - lovenox FEN - NPO, IVF  Plan - continue NGT and NPO.  Encouraged mobilization. Continue PT.   Will plan to remove vac on POD7. Has been NPO x1 week, will order PICC/TNA.   LOS: 8 days    Jerrye Beavers , [facility] 07/10/2016, 11:12 AM Pager: [phone] Consults: [phone] Mon-Fri 7:00 am-4:30 pm Sat-Sun 7:00 am-11:30 am  Agree with above. Agree with PICC and TPN - her ileus may be slow to resolve. With limited ambulation, it may even take longer.  Alphonsa Overall, MD, [facility] Pager: [phone] Office phone:  [phone]

## 2016-07-10 NOTE — Progress Notes (Signed)
OT Cancellation Note  Patient Details Name: ELTON SOSSAMON MRN: [id number] DOB: 11/26/1945   Cancelled Treatment:    Reason Eval/Treat Not Completed: Fatigue/lethargy limiting ability to participate. Will follow.  Malka So 07/10/2016, 2:44 PM

## 2016-07-10 NOTE — Progress Notes (Signed)
Initial Nutrition Assessment  DOCUMENTATION CODES:   Morbid obesity  INTERVENTION:  TPN per Pharmacy.  Monitor magnesium, potassium, and phosphorus daily for at least 3 days, MD to replete as needed, as pt is at risk for refeeding syndrome given NPO status for 8 days.  RD to continue to monitor.   NUTRITION DIAGNOSIS:   Inadequate oral intake related to altered GI function as evidenced by NPO status.  GOAL:   Patient will meet greater than or equal to 90% of their needs  MONITOR:   Labs, Weight trends, Skin, I & O's (TPN)  REASON FOR ASSESSMENT:   NPO/Clear Liquid Diet (8 days)    ASSESSMENT:   70 y.o. woman with a history of HTN, GERD, DVT (she is not on anticoagulation currently), and chronic pain who presents to the ED for evaluation of abdominal pain x 5 five days.   . She was also found to have an elevated lipase, CT scan showed small bowel obstruction. General surgery was consulted and patient was admitted to medicine service. Patient underwent exploratory laparoscopy on 10/13.  Pt with NGT in place for suction. Per MD note, pt continues to complain of abdominal pain. Plans for TPN to start tomorrow. RD attempted to visit patient multiple times, however pt was unavailable. RD unable to obtain nutrition history. Pt has been NPO since admission. Pt at risk for refeeding syndrome once nutrition is started as pt with no nutrition in at least 8 days. Per weight records, weight has been stable. Unable to complete Nutrition-Focused physical exam at this time. RD to perform at next visit.   Labs and medications reviewed.   Diet Order:  Diet NPO time specified Except for: Ice Chips  Skin:  Wound (see comment) (Incision on abdomen with wound VAC)  Last BM:  10/11  Height:   Ht Readings from Last 1 Encounters:  07/03/16 5\' 5"  (1.651 m)    Weight:   Wt Readings from Last 1 Encounters:  07/09/16 269 lb (122 kg)    Ideal Body Weight:  56.8 kg  BMI:  Body mass index  is 44.76 kg/m.  Estimated Nutritional Needs:   Kcal:  [id number]  Protein:  110-130 grams  Fluid:  1.9 - 2 L/day  EDUCATION NEEDS:   No education needs identified at this time  Corrin Parker, MS, RD, LDN Pager # [phone] After hours/ weekend pager # [phone]

## 2016-07-10 NOTE — Progress Notes (Signed)
Peripherally Inserted Central Catheter/Midline Placement  The IV Nurse has discussed with the patient and/or persons authorized to consent for the patient, the purpose of this procedure and the potential benefits and risks involved with this procedure.  The benefits include less needle sticks, lab draws from the catheter, and the patient may be discharged home with the catheter. Risks include, but not limited to, infection, bleeding, blood clot (thrombus formation), and puncture of an artery; nerve damage and irregular heartbeat and possibility to perform a PICC exchange if needed/ordered by physician.  Alternatives to this procedure were also discussed.  Bard Power PICC patient education guide, fact sheet on infection prevention and patient information card has been provided to patient /or left at bedside.    PICC/Midline Placement Documentation        Amy [name] 07/10/2016, 4:45 PM

## 2016-07-10 NOTE — Progress Notes (Signed)
[facility] NOTE   Pharmacy Consult for TPN Indication: Prolonged Ileus  Patient Measurements: Height: 5\' 5"  (165.1 cm) Weight: 269 lb (122 kg) IBW/kg (Calculated) : 57 TPN AdjBW (KG): 73.7 Body mass index is 44.76 kg/m. Usual Weight:   Assessment: 15 YOF who presented on 10/9 with abdominal pain, N/V. CT showed a SBO and surgery was consulted. NGT was placed on 10/10 and the patient was made NPO. The patient underwent ex-lap with lysis of adhesions on 10/14. Post-op the patient has remained NPO with no flatus or BM. Due to prolonged NPO status of ~1 week, pharmacy has been consulted to start TNA.   GI: SBO s/p ex-lap with lysis of adhesions on 10/14. NPO x 7d - d/t inability to get PICC placed today, pt to start TPN on 10/18. NGO/24h: 500 cc. LBM 10/11 Endo: CBGs 137-155 * Insulin requirements in the past 24 hours: None Lytes: CMET pending today. Pt will be at risk for refeeding syndrome due to prolonged NPO - will monitor electrolytes closely and only advance as tolerated Renal: SCr 0.81, CrCl~70-80 ml/min (normalized) Pulm: 100%/RA Cards: BP wnl-elevated, HR wnl-tachy. On clonidine patch.  Hepatobil: From 10/14: LFTs wnl, TBili 1.3 Neuro: Pain score/24h: 3-8 (abdomen). On prn morphine ID: Afebrile, WBC 11.3, on no abx  Best Practices: Enox 60 mg/day (0.5 mg/kg) for VTE px with BMI>30 TPN Access: Yet to be placed TPN start date: Expected 10/18  Nutritional Goals (awaiting RD recommendations): kCal: Protein:   Current Nutrition: NPO  Plan:  - Per discussion with PICC team - unlikely to be placed today. To avoid TPN waste with likely shortage in the near future of Clinimix bags - will plan to start TPN on 10/18 if access has been placed. This was discussed with Izora Gala, PA with surgery - Will f/u TPN labs and access placement with plans start TPN on 10/18  Alycia Rossetti, PharmD, BCPS Clinical Pharmacist Pager:  [phone] 07/10/2016 12:50 PM

## 2016-07-11 DIAGNOSIS — R1010 Upper abdominal pain, unspecified: Secondary | ICD-10-CM

## 2016-07-11 LAB — CBC
HCT: 33.5 % — ABNORMAL LOW (ref 36.0–46.0)
Hemoglobin: 11.2 g/dL — ABNORMAL LOW (ref 12.0–15.0)
MCH: 30 pg (ref 26.0–34.0)
MCHC: 33.4 g/dL (ref 30.0–36.0)
MCV: 89.8 fL (ref 78.0–100.0)
Platelets: 275 10*3/uL (ref 150–400)
RBC: 3.73 MIL/uL — ABNORMAL LOW (ref 3.87–5.11)
RDW: 15.7 % — ABNORMAL HIGH (ref 11.5–15.5)
WBC: 11.5 10*3/uL — ABNORMAL HIGH (ref 4.0–10.5)

## 2016-07-11 LAB — COMPREHENSIVE METABOLIC PANEL
ALT: 37 U/L (ref 14–54)
AST: 38 U/L (ref 15–41)
Albumin: 2.1 g/dL — ABNORMAL LOW (ref 3.5–5.0)
Alkaline Phosphatase: 94 U/L (ref 38–126)
Anion gap: 11 (ref 5–15)
BUN: 13 mg/dL (ref 6–20)
CO2: 25 mmol/L (ref 22–32)
Calcium: 8.4 mg/dL — ABNORMAL LOW (ref 8.9–10.3)
Chloride: 105 mmol/L (ref 101–111)
Creatinine, Ser: 0.77 mg/dL (ref 0.44–1.00)
GFR calc Af Amer: >60 mL/min (ref >60)
GFR calc non Af Amer: >60 mL/min (ref >60)
Glucose, Bld: 91 mg/dL (ref 65–99)
Potassium: 2.6 mmol/L — CL (ref 3.5–5.1)
Sodium: 141 mmol/L (ref 135–145)
Total Bilirubin: 0.6 mg/dL (ref 0.3–1.2)
Total Protein: 6.5 g/dL (ref 6.5–8.1)

## 2016-07-11 LAB — BLOOD GAS, ARTERIAL
Acid-Base Excess: 1.9 mmol/L (ref 0.0–2.0)
Bicarbonate: 24.5 mmol/L (ref 20.0–28.0)
Drawn by: 257081
FIO2: 0.21
O2 Saturation: 98.6 %
Patient temperature: 98.6
pCO2 arterial: 28.9 mmHg — ABNORMAL LOW (ref 32.0–48.0)
pH, Arterial: 7.537 — ABNORMAL HIGH (ref 7.350–7.450)
pO2, Arterial: 179 mmHg — ABNORMAL HIGH (ref 83.0–108.0)

## 2016-07-11 LAB — BASIC METABOLIC PANEL
Anion gap: 6 (ref 5–15)
BUN: 14 mg/dL (ref 6–20)
CO2: 27 mmol/L (ref 22–32)
Calcium: 8.3 mg/dL — ABNORMAL LOW (ref 8.9–10.3)
Chloride: 109 mmol/L (ref 101–111)
Creatinine, Ser: 0.61 mg/dL (ref 0.44–1.00)
GFR calc Af Amer: >60 mL/min (ref >60)
GFR calc non Af Amer: >60 mL/min (ref >60)
Glucose, Bld: 106 mg/dL — ABNORMAL HIGH (ref 65–99)
Potassium: 3.1 mmol/L — ABNORMAL LOW (ref 3.5–5.1)
Sodium: 142 mmol/L (ref 135–145)

## 2016-07-11 LAB — DIFFERENTIAL
Basophils Absolute: 0 10*3/uL (ref 0.0–0.1)
Basophils Relative: 0 %
Eosinophils Absolute: 0.1 10*3/uL (ref 0.0–0.7)
Eosinophils Relative: 1 %
Lymphocytes Relative: 13 %
Lymphs Abs: 1.5 10*3/uL (ref 0.7–4.0)
Monocytes Absolute: 0.9 10*3/uL (ref 0.1–1.0)
Monocytes Relative: 8 %
Neutro Abs: 9 10*3/uL — ABNORMAL HIGH (ref 1.7–7.7)
Neutrophils Relative %: 78 %

## 2016-07-11 LAB — TRIGLYCERIDES: Triglycerides: 101 mg/dL (ref <150)

## 2016-07-11 LAB — PREALBUMIN: Prealbumin: 9.5 mg/dL — ABNORMAL LOW (ref 18–38)

## 2016-07-11 LAB — PHOSPHORUS: Phosphorus: 4 mg/dL (ref 2.5–4.6)

## 2016-07-11 LAB — TROPONIN I
Troponin I: 0.09 ng/mL (ref <0.03)
Troponin I: 0.04 ng/mL (ref <0.03)

## 2016-07-11 LAB — GLUCOSE, CAPILLARY
Glucose-Capillary: 108 mg/dL — ABNORMAL HIGH (ref 65–99)
Glucose-Capillary: 95 mg/dL (ref 65–99)

## 2016-07-11 LAB — MAGNESIUM: Magnesium: 1.9 mg/dL (ref 1.7–2.4)

## 2016-07-11 IMAGING — MR MR PELVIS W/O CM
4 of 5 series · 15 of 48 positions shown · non-contrast
Comparison: CT abdomen and pelvis 01/30/2016.

CLINICAL DATA: Severe anterior pelvic pain radiating into the right
and left groin for about 2 weeks. Patient status post lower lumbar
fusion 09/28/2015.

EXAM:
MRI PELVIS WITHOUT CONTRAST
TECHNIQUE: Multiplanar multisequence MR imaging of the pelvis was performed. No
intravenous contrast was administered.

[Series 3: STIR · coronal · 5.0mm · 0.74mm/px · 6 of 33 slices shown]
[im 1/33]
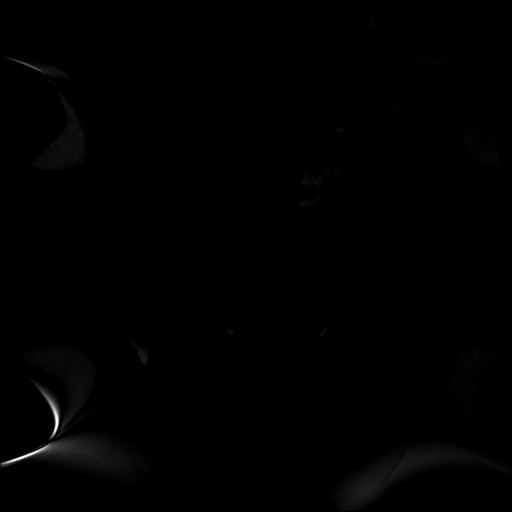
[im 5/33]
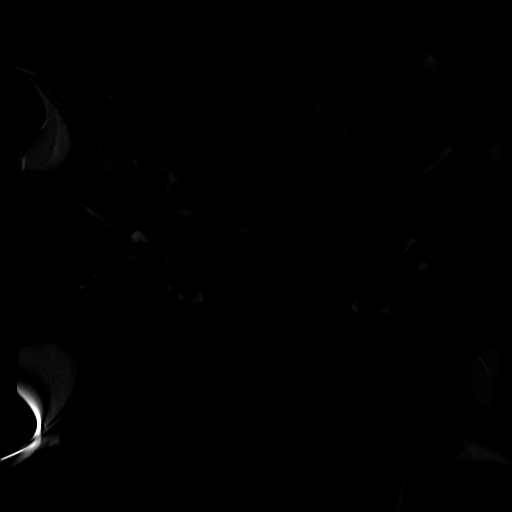
[im 9/33]
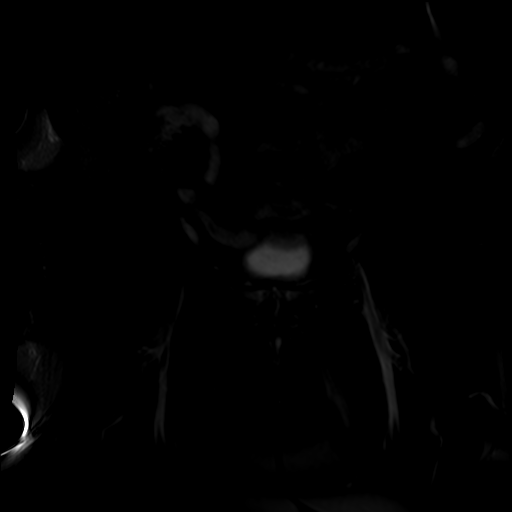
[im 13/33]
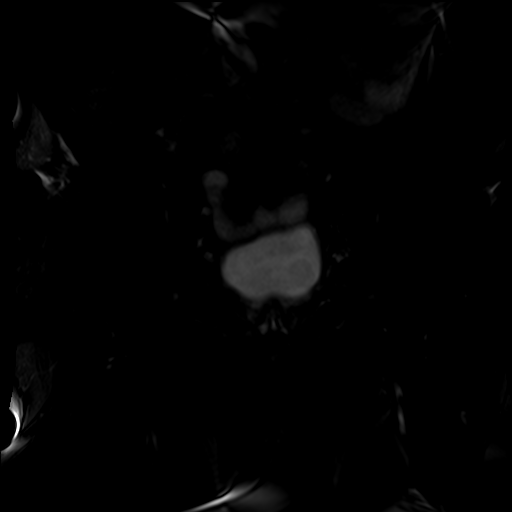
[im 17/33]
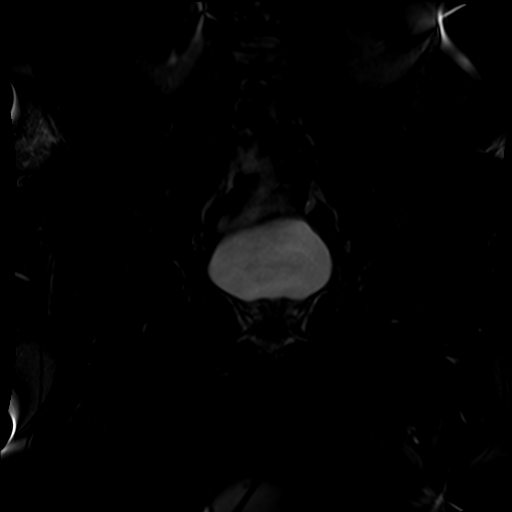
[im 29/33]
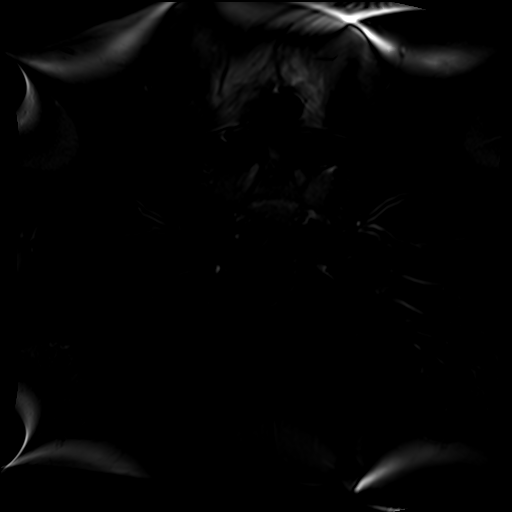

[Series 4: T1 · axial · 5.0mm · 0.56mm/px · z∈[-49,+101]mm · 3 of 35 slices shown (1 of 2)]
[im 5/35]
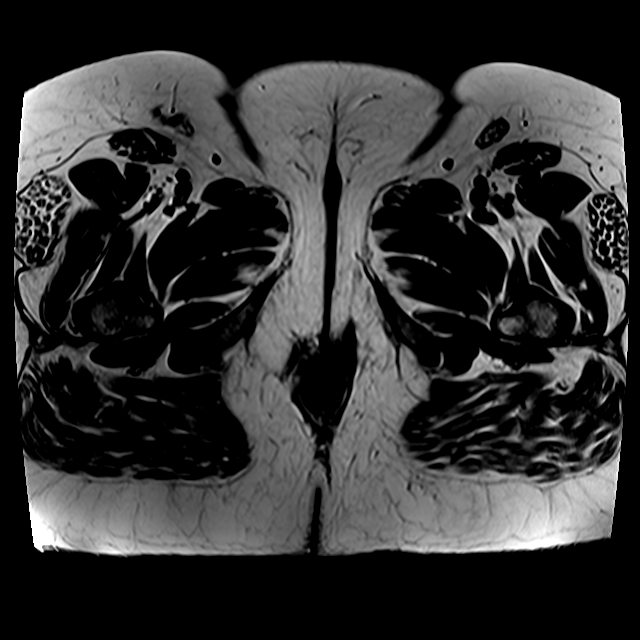
[im 18/35]
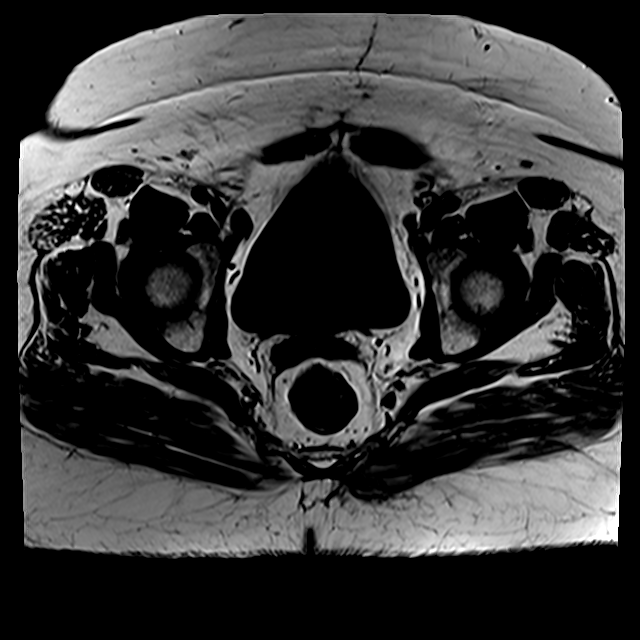
[im 30/35]
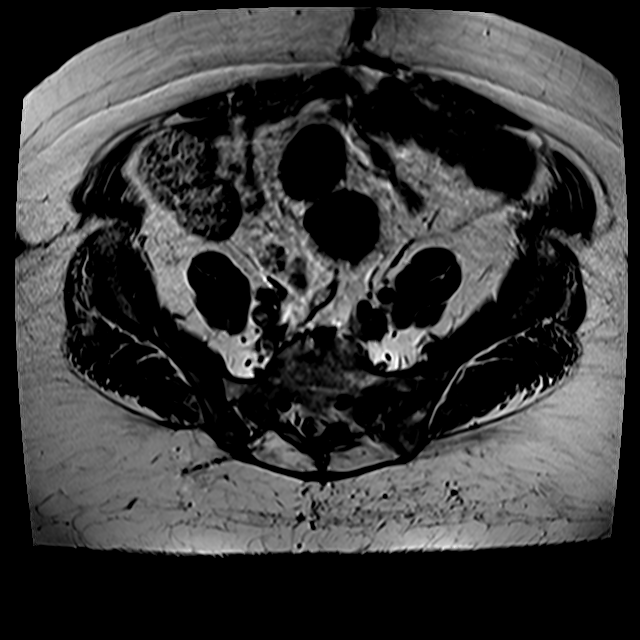

[Series 5: T2 fat-sat · axial · 5.0mm · 0.56mm/px · z∈[-49,+101]mm · 3 of 35 slices shown]
[im 5/35]
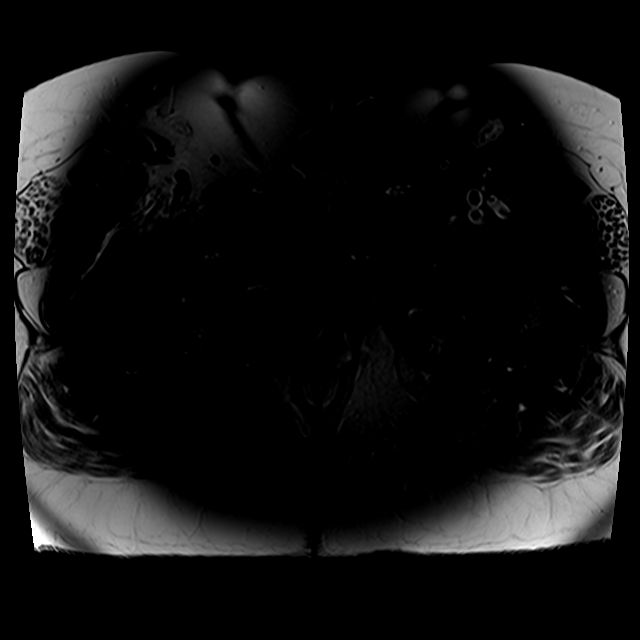
[im 18/35]
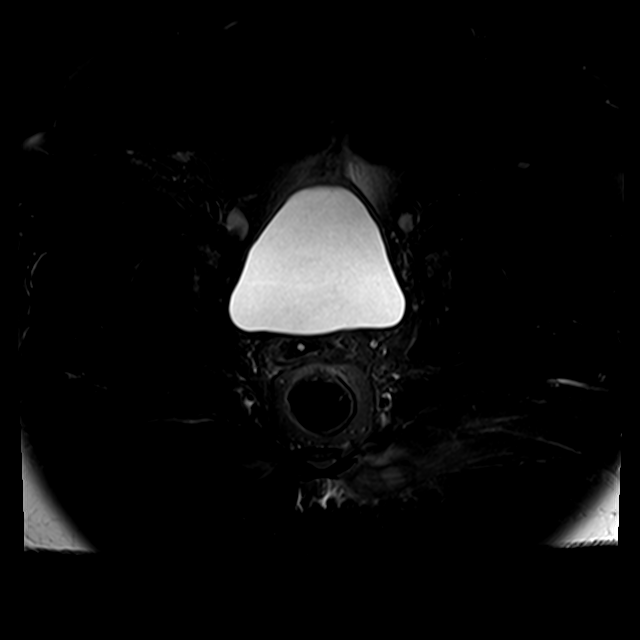
[im 30/35]
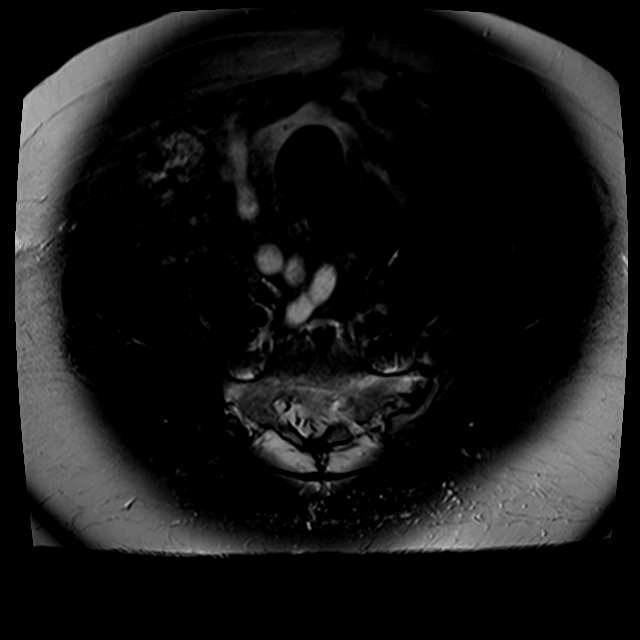

[Series 6: T1 · coronal · 5.0mm · 0.56mm/px · 3 of 33 slices shown (2 of 2)]
[im 5/33]
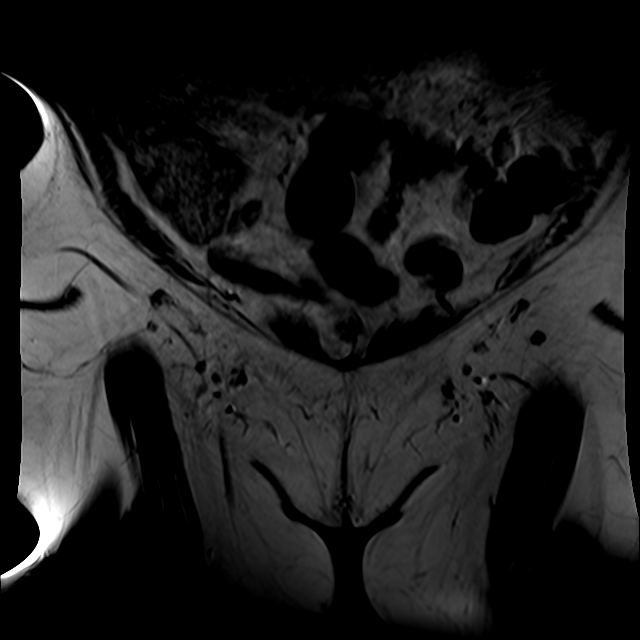
[im 17/33]
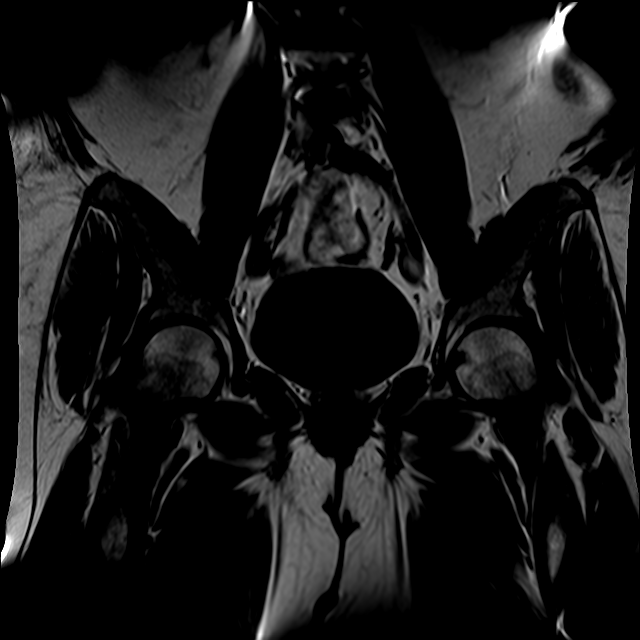
[im 29/33]
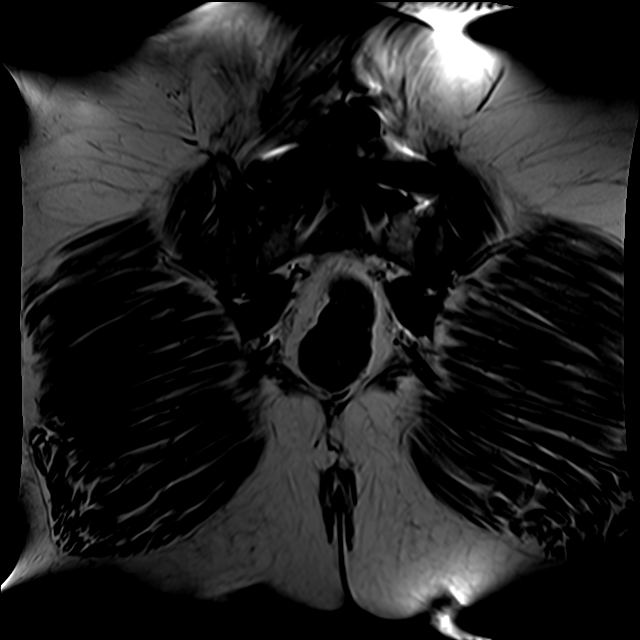

[15 of 48 positions shown; findings below may reference images not displayed]

FINDINGS: Artifact from lower lumbar fusion hardware is noted. The patient has
marked degenerative change about the symphysis pubis with bony
hypertrophy and subchondral edema about the joint. Degenerative
change is also seen about the sacroiliac joints bilaterally and
appears somewhat worse on the left where a few tiny subchondral
cysts are identified. Bone marrow signal is otherwise unremarkable.
No fracture is identified. There is no avascular necrosis of the
femoral heads. Imaged pelvic musculature is intact and normal
appearance. No evidence of bursitis is seen.

No hernia is identified. Imaged intrapelvic contents demonstrate
postoperative change of hysterectomy. No lymphadenopathy or fluid
collection is identified.
IMPRESSION: No acute abnormality.

Advanced degenerative change about the symphysis pubis. Mild to
moderate bilateral SI joint degenerative disease appears worse on
the left.

Status post lower lumbar fusion and hysterectomy.

## 2016-07-11 MED ORDER — ONDANSETRON HCL 4 MG/2ML IJ SOLN
4.0000 mg | Freq: Four times a day (QID) | INTRAMUSCULAR | Status: DC | PRN
  Administered 2016-07-11 – 2016-07-12 (×2): 4 mg via INTRAVENOUS
  Filled 2016-07-11 (×4): qty 2

## 2016-07-11 MED ORDER — MAGNESIUM SULFATE IN D5W 1-5 GM/100ML-% IV SOLN
1.0000 g | Freq: Once | INTRAVENOUS | Status: AC
  Administered 2016-07-11: 1 g via INTRAVENOUS
  Filled 2016-07-11: qty 100

## 2016-07-11 MED ORDER — FAT EMULSION 20 % IV EMUL
168.0000 mL | INTRAVENOUS | Status: AC
  Administered 2016-07-11: 168 mL via INTRAVENOUS
  Filled 2016-07-11: qty 250

## 2016-07-11 MED ORDER — POTASSIUM CHLORIDE 10 MEQ/100ML IV SOLN
10.0000 meq | INTRAVENOUS | Status: DC
  Administered 2016-07-11 (×3): 10 meq via INTRAVENOUS
  Filled 2016-07-11 (×4): qty 100

## 2016-07-11 MED ORDER — POTASSIUM CHLORIDE 10 MEQ/100ML IV SOLN
10.0000 meq | INTRAVENOUS | Status: AC
  Administered 2016-07-11 (×3): 10 meq via INTRAVENOUS
  Filled 2016-07-11 (×2): qty 100

## 2016-07-11 MED ORDER — POTASSIUM CHLORIDE 10 MEQ/100ML IV SOLN: 10.0000 meq | INTRAVENOUS | Status: DC

## 2016-07-11 MED ORDER — TRACE MINERALS CR-CU-MN-SE-ZN 10-1000-500-60 MCG/ML IV SOLN
INTRAVENOUS | Status: AC
  Administered 2016-07-11: 18:00:00 via INTRAVENOUS
  Filled 2016-07-11: qty 840

## 2016-07-11 MED ORDER — POTASSIUM CHLORIDE 10 MEQ/100ML IV SOLN
10.0000 meq | INTRAVENOUS | Status: AC
  Administered 2016-07-11 – 2016-07-12 (×4): 10 meq via INTRAVENOUS
  Filled 2016-07-11 (×4): qty 100

## 2016-07-11 MED ORDER — MORPHINE SULFATE (PF) 2 MG/ML IV SOLN
2.0000 mg | INTRAVENOUS | Status: DC | PRN
  Administered 2016-07-11 – 2016-07-16 (×12): 2 mg via INTRAVENOUS
  Filled 2016-07-11 (×13): qty 1

## 2016-07-11 MED ORDER — POTASSIUM CHLORIDE IN NACL 40-0.9 MEQ/L-% IV SOLN
INTRAVENOUS | Status: DC
  Administered 2016-07-11 – 2016-07-12 (×2): 50 mL/h via INTRAVENOUS
  Filled 2016-07-11 (×5): qty 1000

## 2016-07-11 MED ORDER — INSULIN ASPART 100 UNIT/ML ~~LOC~~ SOLN
0.0000 [IU] | SUBCUTANEOUS | Status: AC
  Administered 2016-07-11: 2 [IU] via SUBCUTANEOUS
  Administered 2016-07-12 (×3): 1 [IU] via SUBCUTANEOUS

## 2016-07-11 MED ORDER — MAGNESIUM SULFATE 2 GM/50ML IV SOLN
2.0000 g | Freq: Once | INTRAVENOUS | Status: DC
  Filled 2016-07-11: qty 50

## 2016-07-11 MED ORDER — PANTOPRAZOLE SODIUM 40 MG IV SOLR: 40.0000 mg | Freq: Two times a day (BID) | INTRAVENOUS | Status: DC

## 2016-07-11 NOTE — Progress Notes (Signed)
CRITICAL VALUE ALERT  Critical value received:  Troponin=0.09  Date of notification:  07/11/16  Time of notification:  01:22  Critical value read back:Yes.    Nurse who received alert:  Vinie Sill  MD notified (1st page):K. Kirby,NP  Time of first page:  01:50  MD notified (2nd page):  Time of second page:  Responding MD: Josephine Cables  Time MD responded:  01:56

## 2016-07-11 NOTE — Progress Notes (Signed)
[facility] NOTE   Pharmacy Consult for TPN Indication: Prolonged Ileus  Patient Measurements: Height: 5\' 5"  (165.1 cm) Weight: 269 lb (122 kg) IBW/kg (Calculated) : 57 TPN AdjBW (KG): 73.7 Body mass index is 44.76 kg/m. Usual Weight:   Assessment: 38 YOF who presented on 10/9 with abdominal pain, N/V. CT showed a SBO and surgery was consulted. NGT was placed on 10/10 and the patient was made NPO. The patient underwent ex-lap with lysis of adhesions on 10/14. Post-op the patient has remained NPO with no flatus or BM. Due to prolonged NPO status of ~1 week, pharmacy has been consulted to start TNA.   GI: SBO s/p ex-lap with lysis of adhesions on 10/14. NPO x 7d - PICC placed later on 10/17 so TPN to start 10/18. Pre-albumin 9.5, NGO/24h: 200 cc. LBM 10/18 however per surgery still very distended with hypoactive bowel sounds and still requiring NGT - to continue with TPN start on 10/18. Endo: CBGs 91-155 * Insulin requirements in the past 24 hours: None Lytes: K 2.6 - receiving K-runs x 10 per MD and IVF NS + 40 mEq @ 50 ml/hr, Mg 1.9 - receiving Mg 1g IV x 1 per MD, Phos 4. Pt will be at risk for refeeding syndrome due to prolonged NPO - will monitor electrolytes closely and only advance as tolerated. Will plan to recheck BMET this afternoon to f/u on K repletion. Renal: SCr 0.77, CrCl~70-80 ml/min (normalized) Pulm: 96%/RA Cards: BP wnl-elevated, HR wnl-tachy. On clonidine patch.  Hepatobil: LFTs/Tbili WNL Neuro: Pain score/24h: 3-8 (abdomen). On prn morphine ID: Afebrile, WBC 11.3, on no abx  Best Practices: Enox 60 mg/day (0.5 mg/kg) for VTE px with BMI>30 TPN Access: R-double lumen PICC (placed 10/17) TPN start date: 10/18  Nutritional Goals (per RD recommendations on 10/17): KCal: [id number] Protein: 110-130g  Goal Rate:  Clinimix E 5/15 at 83 ml/hr and 20% lipid emulsion at 10 ml/hr will provide ~1900 kcal and ~100g protein (100% of kcal  and 91% of protein goal) * This goal rate intentionally does not meet protein goals due to the impending Clinimix shortage and attempting to keep to a 2L bag size   Current Nutrition: NPO  Plan:  - Start Clinimix E 5/15 at 35 ml/hr and 20% lipid emulsion at 7 ml/hr - this provides 42 g of protein and 932 kCals per day meeting 38% of protein and 49% of kCal needs - Add MVI and trace elements in TPN - Add sensitive SSI + CBG checks q4h and adjust as needed - Monitor TPN labs, K/Phos/Mg daily for 3 days to monitor for refeeding syndome - Will follow-up electrolytes, CBGs, and TPN tolerance - Will recheck K this evening after repletion  Thank you for allowing pharmacy to be a part of this patient's care.  Alycia Rossetti, PharmD, BCPS Clinical Pharmacist Pager: [phone] Clinical phone for 07/11/2016 from 7a-3:30p: [phone] If after 3:30p, please call main pharmacy at: [phone] 07/11/2016 7:50 AM

## 2016-07-11 NOTE — Evaluation (Signed)
Occupational Therapy Evaluation Patient Details Name: Amy [name] MRN: [id number] DOB: 09-01-1946 Today's Date: 07/11/2016    History of Present Illness 70 y.o. female admitted to [facility] on 07/02/16 for abdominal pain, nausea and vomiting.  Pt found to have a SBO and underwent an exploratory laparotomy with post op wound vac on 07/06/16.  Pt with significant PMHx of DOE, PVD, neuropathy, HTN, h/o DVT, fibromyalgia, and chronic back pain (s/p multiple back surgeries).    Clinical Impression   Pt was supervised for showering and assisted for IADL. She intermittently used AD for ambulation. Pt presents generalized weakness, impaired balance, decreased activity tolerance and abdominal pain interfering with ability to perform ADL and mobility. Recommending SNF for further rehab prior to return home. Will follow acutely.   Follow Up Recommendations  SNF;Supervision/Assistance - 24 hour    Equipment Recommendations       Recommendations for Other Services       Precautions / Restrictions Precautions Precautions: Fall Precaution Comments: wound vac, NGT Restrictions Weight Bearing Restrictions: No      Mobility Bed Mobility               General bed mobility comments: pt in chair  Transfers Overall transfer level: Needs assistance Equipment used: Rolling walker (2 wheeled) Transfers: Sit to/from [facility] Sit to Stand: Min assist Stand pivot transfers: Min assist       General transfer comment: min assist to rise and steady, increased time    Balance Overall balance assessment: Needs assistance   Sitting balance-Leahy Scale: Fair       Standing balance-Leahy Scale: Poor                              ADL Overall ADL's : Needs assistance/impaired Eating/Feeding: Independent;Sitting Eating/Feeding Details (indicate cue type and reason): ice chips with spoon Grooming: Oral care;Sitting;Set up   Upper Body Bathing:  Moderate assistance;Sitting   Lower Body Bathing: Total assistance;Sit to/from stand   Upper Body Dressing : Sitting;Minimal assistance   Lower Body Dressing: Total assistance;Sit to/from stand   Toilet Transfer: Minimal assistance;Stand-pivot;BSC;RW;Requires wide/bariatric   Toileting- Clothing Manipulation and Hygiene: Total assistance;Sit to/from stand       Functional mobility during ADLs: Minimal assistance;Rolling walker       Vision     Perception     Praxis      Pertinent Vitals/Pain Pain Assessment: 0-10 Pain Score: 9  Pain Location: abdomen Pain Descriptors / Indicators: Aching;Grimacing;Guarding Pain Intervention(s): Monitored during session;Repositioned;Patient requesting pain meds-RN notified     Hand Dominance Right   Extremity/Trunk Assessment Upper Extremity Assessment Upper Extremity Assessment: Generalized weakness   Lower Extremity Assessment Lower Extremity Assessment: Defer to PT evaluation   Cervical / Trunk Assessment Cervical / Trunk Assessment: Other exceptions Cervical / Trunk Exceptions: h/o multiple spine surgeries   Communication Communication Communication: No difficulties (low volume)   Cognition Arousal/Alertness: Awake/alert Behavior During Therapy: WFL for tasks assessed/performed Overall Cognitive Status: Within Functional Limits for tasks assessed                 General Comments: Pt with sense of humor.   General Comments       Exercises       Shoulder Instructions      Home Living Family/patient expects to be discharged to:: Skilled nursing facility Living Arrangements: Spouse/significant other;Children Available Help at Discharge: Family;Available PRN/intermittently Type of Home: House Home Access: Stairs  to enter Entrance Stairs-Number of Steps: 2 Entrance Stairs-Rails: Right Home Layout: One level     Bathroom Shower/Tub: Occupational psychologist: Handicapped height     Home  Equipment: [place] - single point;Bedside commode;Shower seat;Other (comment);Walker - 2 wheels (adjustable bed)   Additional Comments: Pt reports she had no assistance during the day as spouse works and her daughter has medical issues herself      Prior Functioning/Environment Level of Independence: Needs assistance  Gait / Transfers Assistance Needed: "depends on the day" if she uses walker or cane ADL's / Homemaking Assistance Needed: was supervised for showering, assist for IADL            OT Problem List: Decreased strength;Decreased activity tolerance;Impaired balance (sitting and/or standing);Decreased knowledge of use of DME or AE;Obesity;Pain   OT Treatment/Interventions: Self-care/ADL training;Therapeutic exercise;DME and/or AE instruction;Therapeutic activities;Patient/family education;Balance training    OT Goals(Current goals can be found in the care plan section) Acute Rehab OT Goals Patient Stated Goal: pt wants to decrease pain OT Goal Formulation: With patient Time For Goal Achievement: 07/25/16 Potential to Achieve Goals: Good ADL Goals Pt Will Perform Grooming: with supervision;standing Pt Will Perform Upper Body Bathing: with min assist;sitting Pt Will Perform Lower Body Bathing: with min assist;with adaptive equipment;sit to/from stand Pt Will Perform Upper Body Dressing: with set-up;sitting Pt Will Perform Lower Body Dressing: with min assist;with adaptive equipment;sit to/from stand Pt Will Transfer to Toilet: with supervision;ambulating;bedside commode Pt Will Perform Toileting - Clothing Manipulation and hygiene: with supervision;sit to/from stand Pt/caregiver will Perform Home Exercise Program: Increased strength;Both right and left upper extremity;Independently (level 2 theraband)  OT Frequency: Min 2X/week   Barriers to D/C: Decreased caregiver support          Co-evaluation              End of Session Equipment Utilized During Treatment:  Rolling walker Nurse Communication: Patient requests pain meds (ok to give ice, RN to reconnect NGT)  Activity Tolerance: Patient limited by fatigue;No increased pain Patient left: in chair;with call bell/phone within reach;with family/visitor present   Time: [id number] OT Time Calculation (min): 22 min Charges:  OT General Charges $OT Visit: 1 Procedure OT Evaluation $OT Eval Moderate Complexity: 1 Procedure G-Codes:    Malka So 07/11/2016, 4:41 PM  [phone]

## 2016-07-11 NOTE — Progress Notes (Signed)
Notified MD on call of EKG result in epic. No new order. [place], Wonda Cheng, Therapist, sports

## 2016-07-11 NOTE — Progress Notes (Signed)
PROGRESS NOTE  Amy [name] [id number] DOB: 1945-10-03 DOA: 07/02/2016 PCP: Blanchie Serve, MD   LOS: 9 days   Brief Narrative: Amy [name] is a 70 y.o. woman with a history of HTN, GERD, DVT (she is not on anticoagulation currently), and chronic pain who presents to the ED for evaluation of abdominal pain x 5 five days.   . She was also found to have an elevated lipase, CT scan showed small bowel obstruction. General surgery was consulted and patient was admitted to medicine service. Patient underwent exploratory laparoscopy on 10/14.   Assessment & Plan: Principal Problem:   SBO (small bowel obstruction) Active Problems:   Essential hypertension   Allergic rhinitis   GERD (gastroesophageal reflux disease)   Chronic pain   Pancreatitis   Chest pain,sinus tachycardia, this has been an intermittent problem HR 120'130's at rest, started on prn iv  metoprolol, still has intermittent tachycardia Left sided CP, last night, troponin 0.03>0.06,    continue  telemetry, prn metoprolol for HR >110,  CT PE protocol, DVT study to r/o PE/DVT-negative  Troponin has been flat, ECHO  shows EF of 60-65%, wall motion is normal, grade 1 diastolic dysfunction, cardiopulmonary status, stable so far   chest x-ray 10/17 as the patient was complaining of shortness of breath, CXR negative  Started prn Xopenex, although patient does not appear to be hypoxic, 100% on room air.   Anxiety could be playing a role Esophageal irritation from NG tube could be contributing to her chest discomfort, start PPI     Small  bowel obstruction  related to adhesions related to previous surgeries, she has had history of the SBO in the past requiring LOA    repeat CT scan on 10/12 showed small bowel obstruction with decompression of the stomach, pancreas appear normal - Continue  nothing by mouth, continue IV fluids, analgesics and antiemetics,high NGT output Minimize narcotics given chronic  lethargy, easily gets oversedated   s/p LAPAROSCOPY DIAGNOSTIC EMERGENT TO OPEN (N/A) 10/13 EXPLORATORY LAPAROTOMY LYSIS OF ADHESIONS  . -Currently has wound VAC Has not had any flatus, although NGT output slowing down Encourage ambulation Hemoglobin slowly drifting down Surgery recommended PICC/TNA  Hypokalemia-replete aggressively Hopefully after starting TPN electrolytes will improve   Acute pancreatitis/ elevated lipase -initial  CT scan however shows no significant pancreatic changes, abdominal ultrasound with minimal soft tissue inflammation around the pancreatic head concerning for mild acute pancreatitis, significant elevation of lipase probably related to small bowel obstruction       HTN - continue home medications with clonidine patch  Continue telemetry, when necessary hydralazine, metoprolol    GERD - pepcid  Elevated LFTs - related to ?pancreatitis, she is s/p cholecystectomy. LFs and lipase improving.  - CBD without dilatation so less likely choledocholithiasis,  Oropharyngeal discomfort-related to NG tube Will start patient on Magic mouthwash, Chloraseptic spray and apical lozenges not helping Start patient on a PPI for chest discomfort   DVT prophylaxis: Lovenox Code Status: DNR Family Communication: no family bedside Disposition Plan:  Continue to monitor on telemetry, anticipated discharge as per surgery recommendations, will need SNF   Consultants:   General surgery  Procedures:   LAPAROSCOPY DIAGNOSTIC EMERGENT TO OPEN EXPLORATORY LAPAROTOMY, LYSIS OF ADHESIONS FOR 3 HOURS - 07/06/2016 - [facility]   Antimicrobials:  None    Subjective:  Still complaining of pain in the back of her throat,    Objective: Vitals:   07/10/16 1800 07/10/16 2149 07/11/16 0144 07/11/16 0648  BP: Marland Kitchen)  145/89 (!) 166/84 (!) 164/84 (!) 174/88  Pulse: (!) 105 (!) 110 (!) 109 (!) 108  Resp: 18 18 18 18   Temp: 98.5 F (36.9 C) 98.6 F (37 C)  97.9 F (36.6 C)    TempSrc: Oral Oral  Oral  SpO2: 100% 100% 100% 96%  Weight:      Height:        Intake/Output Summary (Last 24 hours) at 07/11/16 1028 Last data filed at 07/11/16 0647  Gross per 24 hour  Intake              200 ml  Output             1750 ml  Net            -1550 ml   Filed Weights   07/02/16 1500 07/03/16 0044 07/09/16 2105  Weight: 117.9 kg (260 lb) 123.8 kg (273 lb) 122 kg (269 lb)    Examination: Constitutional: NAD Vitals:   07/10/16 1800 07/10/16 2149 07/11/16 0144 07/11/16 0648  BP: (!) 145/89 (!) 166/84 (!) 164/84 (!) 174/88  Pulse: (!) 105 (!) 110 (!) 109 (!) 108  Resp: 18 18 18 18   Temp: 98.5 F (36.9 C) 98.6 F (37 C)  97.9 F (36.6 C)  TempSrc: Oral Oral  Oral  SpO2: 100% 100% 100% 96%  Weight:      Height:       Eyes: PERRL, lids and conjunctivae normal ENMT: Mucous membranes are dry Neck: normal, supple, no masses, no thyromegaly Respiratory: clear to auscultation bilaterally, no wheezing, no crackles.  Cardiovascular: Regular rate and rhythm, no murmurs / rubs / gallops.  Abdomen: abdomen with tenderness throughout. No BS Musculoskeletal: no clubbing / cyanosis.  Skin: no rashes, lesions, ulcers. No induration Neurologic: non focal    Data Reviewed: I have personally reviewed following labs and imaging studies  CBC:  Recent Labs Lab 07/06/16 0553 07/07/16 0712 07/08/16 0554 07/10/16 1407 07/11/16 0433  WBC 5.9 7.2 11.3* 12.2* 11.5*  NEUTROABS  --   --   --   --  9.0*  HGB 12.7 14.5 13.1 12.5 11.2*  HCT 38.8 44.5 38.5 37.0 33.5*  MCV 91.7 91.4 90.4 89.6 89.8  PLT 252 279 232 291 [id number]   Basic Metabolic Panel:  Recent Labs Lab 07/06/16 0553 07/07/16 0712 07/08/16 0554 07/10/16 1407 07/11/16 0433  NA 143 146* 143 144 141  K 3.6 3.9 4.4 3.3* 2.6*  CL 114* 115* 116* 108 105  CO2 21* 23 19* 27 25  GLUCOSE 82 137* 144* 105* 91  BUN 20 18 21* 11 13  CREATININE 0.88 0.98 0.81 0.73 0.77  CALCIUM 8.7* 8.3* 8.2* 8.5* 8.4*  MG  --    --   --   --  1.9  PHOS  --   --   --   --  4.0   GFR: Estimated Creatinine Clearance: 85.7 mL/min (by C-G formula based on SCr of 0.77 mg/dL). Liver Function Tests:  Recent Labs Lab 07/05/16 0532 07/06/16 0553 07/07/16 0712 07/10/16 1407 07/11/16 0433  AST 27 27 25  34 38  ALT 50 40 32 32 37  ALKPHOS 73 72 62 88 94  BILITOT 0.9 0.6 1.3* 0.7 0.6  PROT 6.6 6.6 5.9* 6.8 6.5  ALBUMIN 2.8* 2.8* 2.2* 2.3* 2.1*   No results for input(s): LIPASE, AMYLASE in the last 168 hours. No results for input(s): AMMONIA in the last 168 hours. Coagulation Profile: No results for input(s): INR, PROTIME  in the last 168 hours. Lipid Profile:  Recent Labs  07/11/16 0433  TRIG 101   Urine analysis:    Component Value Date/Time   COLORURINE AMBER (A) 07/03/2016 0721   APPEARANCEUR CLEAR 07/03/2016 0721   LABSPEC >1.046 (H) 07/03/2016 0721   PHURINE 6.0 07/03/2016 0721   GLUCOSEU NEGATIVE 07/03/2016 0721   HGBUR NEGATIVE 07/03/2016 0721   BILIRUBINUR SMALL (A) 07/03/2016 0721   KETONESUR NEGATIVE 07/03/2016 0721   PROTEINUR 100 (A) 07/03/2016 0721   UROBILINOGEN 1.0 04/21/2014 0756   NITRITE NEGATIVE 07/03/2016 0721   LEUKOCYTESUR NEGATIVE 07/03/2016 0721   Radiology Studies: Dg Chest Port 1 View  Result Date: 07/10/2016 CLINICAL DATA:  Shortness of breath, hypertension, asthma EXAM: PORTABLE CHEST 1 VIEW COMPARISON:  07/07/2016 FINDINGS: Cardiomediastinal silhouette is stable. Mild basilar atelectasis. No pulmonary edema. NG tube in place. Linear atelectasis or infiltrate in lingula. IMPRESSION: No pulmonary edema. Mild basilar atelectasis. Linear atelectasis or infiltrate in lingula. Electronically Signed   By: Lahoma Crocker M.D.   On: 07/10/2016 11:07     Scheduled Meds: . chlorhexidine  15 mL Mouth Rinse BID  . cloNIDine  0.1 mg Transdermal Q Tue  . enoxaparin (LOVENOX) injection  60 mg Subcutaneous Q24H  . famotidine (PEPCID) IV  20 mg Intravenous Q12H  . magnesium sulfate 1 - 4 g  bolus IVPB  1 g Intravenous Once  . mouth rinse  15 mL Mouth Rinse q12n4p  . [START ON 07/14/2016] pantoprazole  40 mg Intravenous Q12H  . potassium chloride  10 mEq Intravenous Q1 Hr x 6  . potassium chloride  10 mEq Intravenous Q1 Hr x 4   Continuous Infusions: . 0.9 % NaCl with KCl 40 mEq / L       [facility] Pager [phone]  If 7PM-7AM, please contact night-coverage [email or web address] Password [email or web address] 07/11/2016, 10:28 AM

## 2016-07-11 NOTE — Progress Notes (Signed)
07/11/16 01:30 Patient's troponin=0.09. VSS. Patient asleep at this time.When asked if she's having any chest discomfort/pain. Patient points to upper epigastric area.Offered to give her pain med but patient refused. "Pain medicine gives me pain",clarified with patient and repeats the same. Patient going back to sleep at this time. Will continue to monitor. [place], Wonda Cheng, Therapist, sports

## 2016-07-11 NOTE — Consult Note (Signed)
   [facility] CM Inpatient Consult   07/11/2016  [name] 1946-07-31 [id number]    [facility] Care Management follow up. Spoke with inpatient RNCM. Patient likely to need SNF at discharge. Discussed that if disposition needs change to home, please alert [facility] Care Management by placing [place] Management consult.   Marthenia Rolling, MSN-Ed, RN,BSN [facility] Liaison [phone]

## 2016-07-11 NOTE — Progress Notes (Signed)
CRITICAL VALUE ALERT  Critical value received:  Potassium level=2.6  Date of notification:  07/11/16  Time of notification:  05:27  Critical value read back:Yes.    Nurse who received alert:  Vinie Sill  MD notified (1st page):  Josephine Cables  Time of first page:  05:42  MD notified (2nd page):  Time of second page:  Responding MD:  Josephine Cables  Time MD responded:  06:01,order placed in epic

## 2016-07-11 NOTE — Progress Notes (Signed)
Pharmacy:  Assessment: K+ 3.1 tonight.  Up from 2.6 after 6 KCl runs given today.  Also receiving NS with 40 mEq KCl/liter at 50 ml/hr.  Plan: KCl 10 mEq IV x 4 runs. Next bmet in am.  Kelvin Cellar, RPh  07/11/2016 11:20 PM

## 2016-07-11 NOTE — Progress Notes (Signed)
Nutrition Follow-up  DOCUMENTATION CODES:   Morbid obesity  INTERVENTION:  TPN per Pharmacy.   Monitor magnesium, potassium, and phosphorus daily for at least 3 days, MD to replete as needed, as pt is at risk for refeeding syndrome given NPO status for 8 days.  RD to continue to monitor.   NUTRITION DIAGNOSIS:   Inadequate oral intake related to altered GI function as evidenced by NPO status; ongoing  GOAL:   Patient will meet greater than or equal to 90% of their needs; not met  MONITOR:   Labs, Weight trends, Skin, I & O's (TPN)  REASON FOR ASSESSMENT:   NPO/Clear Liquid Diet (8 days)    ASSESSMENT:   70 y.o. woman with a history of HTN, GERD, DVT (she is not on anticoagulation currently), and chronic pain who presents to the ED for evaluation of abdominal pain x 5 five days.   . She was also found to have an elevated lipase, CT scan showed small bowel obstruction. General surgery was consulted and patient was admitted to medicine service. Patient underwent exploratory laparoscopy on 10/13.  Plans to start TPN today. Pt reports abdominal pains. NGT in place. Output has been decreasing. Per Pharmacy note, Clinimix E 5/15 to start at 35 ml/hr and 20% lipid emulsion at 7 ml/hr which will provide 932 kcals (49% of kcal needs), and 42 grams of protein (38% of protein needs). Plan for goal rate of Clinimix E 5/15 at 83 ml/hr and 20% lipid emulsion at 10 ml/hr will provide ~1900 kcal and ~100g protein (100% of kcal and 91% of protein goal). Per Pharmacy note, goal rate intentionally does not meet protein goals due to the impending Clinimix shortage and attempting to keep to a 2L bag size. Once nutrition is started, pt at risk for refeeding syndrome due to NPO x 8 days.   Pt with no observed significant fat or muscle mass loss.   Labs and medications reviewed. Potassium low at 2.6. Phosphorous and magnesium WNL.   Diet Order:  Diet NPO time specified Except for: Ice Chips TPN  (CLINIMIX-E) Adult  Skin:  Wound (see comment) (Incision on abdomen with wound VAC)  Last BM:  10/17  Height:   Ht Readings from Last 1 Encounters:  07/03/16 5' 5" (1.651 m)    Weight:   Wt Readings from Last 1 Encounters:  07/11/16 275 lb (124.7 kg)    Ideal Body Weight:  56.8 kg  BMI:  Body mass index is 45.76 kg/m.  Estimated Nutritional Needs:   Kcal:  1900-2050  Protein:  110-130 grams  Fluid:  1.9 - 2 L/day  EDUCATION NEEDS:   No education needs identified at this time  Stephanie Craig, MS, RD, LDN Pager # [phone] After hours/ weekend pager # [phone]  

## 2016-07-11 NOTE — Progress Notes (Signed)
Physical Therapy Treatment Patient Details Name: Amy [name] MRN: [id number] DOB: 05-Apr-1946 Today's Date: 07/11/2016    History of Present Illness 70 y.o. female admitted to [facility] on 07/02/16 for abdominal pain, nausea and vomiting.  Pt found to have a SBO and underwent an exploratory laparotomy with post op wound vac on 07/06/16.  Pt with significant PMHx of DOE, PVD, neuropathy, HTN, h/o DVT, fibromyalgia, and chronic back pain (s/p multiple back surgeries).     PT Comments    Continuing work on progressive amb and activity tolerance; Noting imporvements in level of assist needed for bed mobility and transfers; continue to agree with SNF for post-acute rehab  Follow Up Recommendations  SNF;Supervision/Assistance - 24 hour     Equipment Recommendations  None recommended by PT    Recommendations for Other Services       Precautions / Restrictions Precautions Precautions: Fall Precaution Comments: wound vac, NGT Restrictions Weight Bearing Restrictions: No    Mobility  Bed Mobility   Bed Mobility: Rolling;Sidelying to Sit Rolling: Min assist Sidelying to sit: Min guard       General bed mobility comments: Cues for log roll and sidelie to sit technique   Transfers Overall transfer level: Needs assistance Equipment used: Rolling walker (2 wheeled) Transfers: Sit to/from [facility] Sit to Stand: Min assist Stand pivot transfers: Min assist       General transfer comment: min assist to rise and steady, increased time  Ambulation/Gait Ambulation/Gait assistance: Min guard Ambulation Distance (Feet): 65 Feet Assistive device: Rolling walker (2 wheeled) Gait Pattern/deviations: Step-through pattern;Trunk flexed Gait velocity: significantly decreased   General Gait Details: Cues to self-monitor for activity tolerance; amb limited by nausea, but able to smooth out steps   Stairs            Wheelchair Mobility    Modified Rankin  (Stroke Patients Only)       Balance Overall balance assessment: Needs assistance   Sitting balance-Leahy Scale: Fair       Standing balance-Leahy Scale: Poor                      Cognition Arousal/Alertness: Awake/alert Behavior During Therapy: WFL for tasks assessed/performed Overall Cognitive Status: Within Functional Limits for tasks assessed                 General Comments: Pt with sense of humor.    Exercises      General Comments        Pertinent Vitals/Pain Pain Assessment: 0-10 Pain Score: 9  Pain Location: abdomen Pain Descriptors / Indicators: Aching;Grimacing Pain Intervention(s): Monitored during session    Home Living Family/patient expects to be discharged to:: Skilled nursing facility Living Arrangements: Spouse/significant other;Children Available Help at Discharge: Family;Available PRN/intermittently Type of Home: House Home Access: Stairs to enter Entrance Stairs-Rails: Right Home Layout: One level Home Equipment: Cane - single point;Bedside commode;Shower seat;Other (comment);Walker - 2 wheels (adjustable bed) Additional Comments: Pt reports she had no assistance during the day as spouse works and her daughter has medical issues herself    Prior Function Level of Independence: Needs assistance  Gait / Transfers Assistance Needed: "depends on the day" if she uses walker or cane ADL's / Homemaking Assistance Needed: was supervised for showering, assist for IADL     PT Goals (current goals can now be found in the care plan section) Acute Rehab PT Goals Patient Stated Goal: pt wants to decrease pain PT Goal Formulation: With  patient Time For Goal Achievement: 07/23/16 Potential to Achieve Goals: Good Progress towards PT goals: Progressing toward goals    Frequency    Min 3X/week      PT Plan Current plan remains appropriate    Co-evaluation PT/OT/SLP Co-Evaluation/Treatment: Yes (partial) Reason for Co-Treatment:  Complexity of the patient's impairments (multi-system involvement);For patient/therapist safety PT goals addressed during session: Mobility/safety with mobility       End of Session   Activity Tolerance: Patient limited by fatigue Patient left: in chair;with call bell/phone within reach;with family/visitor present     Time: 1525-1606 PT Time Calculation (min) (ACUTE ONLY): 41 min  Charges:  $Gait Training: 8-22 mins $Therapeutic Activity: 8-22 mins                    G Codes:      Quin Hoop 07/11/2016, 4:50 PM  Roney Marion, [place] Pager [phone] Office [phone]

## 2016-07-11 NOTE — Progress Notes (Signed)
Patient ID: Amy [name], female   DOB: 08-05-1946, 70 y.o.   MRN: [id number]  [facility] Surgery Progress Note  5 Days Post-Op  Subjective: Patient states she did have a BM, has not noticed flatus. Reports continued bloating and crampy abdominal pain. Some nausea, no vomiting. States that she will try to be more mobile today.  Objective: Vital signs in last 24 hours: Temp:  [97.9 F (36.6 C)-98.6 F (37 C)] 97.9 F (36.6 C) (10/18 0648) Pulse Rate:  [105-110] 108 (10/18 0648) Resp:  [18] 18 (10/18 0648) BP: (145-174)/(84-89) 174/88 (10/18 0648) SpO2:  [96 %-100 %] 96 % (10/18 0648) Last BM Date: 07/10/16 (per previous nurse, loose stools)  Intake/Output from previous day: 10/17 0701 - 10/18 0700 In: 200 [IV Piggyback:200] Out: [id number] [Urine:1550; Emesis/NG output:200] Intake/Output this shift: No intake/output data recorded.  PE: Gen:  Alert, NAD, pleasant Pulm:  Effort normal Abd: Soft, distended, hypoactive BS, midline abdominal wound with vac in place  NG T 200cc/24hr  Lab Results:   Recent Labs  07/10/16 1407 07/11/16 0433  WBC 12.2* 11.5*  HGB 12.5 11.2*  HCT 37.0 33.5*  PLT 291 275   BMET  Recent Labs  07/10/16 1407 07/11/16 0433  NA 144 141  K 3.3* 2.6*  CL 108 105  CO2 27 25  GLUCOSE 105* 91  BUN 11 13  CREATININE 0.73 0.77  CALCIUM 8.5* 8.4*   PT/INR No results for input(s): LABPROT, INR in the last 72 hours. CMP     Component Value Date/Time   NA 141 07/11/2016 0433   NA 140 02/14/2016   K 2.6 (LL) 07/11/2016 0433   CL 105 07/11/2016 0433   CO2 25 07/11/2016 0433   GLUCOSE 91 07/11/2016 0433   BUN 13 07/11/2016 0433   BUN 13 02/14/2016   CREATININE 0.77 07/11/2016 0433   CALCIUM 8.4 (L) 07/11/2016 0433   PROT 6.5 07/11/2016 0433   ALBUMIN 2.1 (L) 07/11/2016 0433   AST 38 07/11/2016 0433   ALT 37 07/11/2016 0433   ALKPHOS 94 07/11/2016 0433   BILITOT 0.6 07/11/2016 0433   GFRNONAA >60 07/11/2016 0433   GFRAA  >60 07/11/2016 0433   Lipase     Component Value Date/Time   LIPASE 61 (H) 07/04/2016 0318       Studies/Results: Dg Chest Port 1 View  Result Date: 07/10/2016 CLINICAL DATA:  Shortness of breath, hypertension, asthma EXAM: PORTABLE CHEST 1 VIEW COMPARISON:  07/07/2016 FINDINGS: Cardiomediastinal silhouette is stable. Mild basilar atelectasis. No pulmonary edema. NG tube in place. Linear atelectasis or infiltrate in lingula. IMPRESSION: No pulmonary edema. Mild basilar atelectasis. Linear atelectasis or infiltrate in lingula. Electronically Signed   By: Lahoma Crocker M.D.   On: 07/10/2016 11:07    Anti-infectives: Anti-infectives    Start     Dose/Rate Route Frequency Ordered Stop   07/06/16 1600  ciprofloxacin (CIPRO) IVPB 400 mg     400 mg 200 mL/hr over 60 Minutes Intravenous To [facility] Surgical 07/06/16 0956 07/06/16 1254       Assessment/Plan Diagnostic laparoscopy converted to open exploratory laparotomy, Lysis of adhesions for 3 hours - 07/06/2016 [facility] - POD 5 - NGT 200/24hr - Prevena wound vac in place >> per WOC this stays in place x7 days and then gets removed and thrown away. - patient did have BM but has not noticed flatus, and she is still very distended with hypoactive BS on exam  Hypokalemia - 2.6, being replenished per medicine  HTN GERD Chronic pain  VTE - lovenox FEN - NPO, IVF, TPN  Plan - continue NGT and NPO.   PICC/TPN.   Encouraged mobilization, PT; patient agreeable to try and do more.   Will plan to remove vac on POD7.   LOS: 9 days    Jerrye Beavers , [facility] Surgery 07/11/2016, 10:23 AM Pager: [phone] Consults: [phone] Mon-Fri 7:00 am-4:30 pm Sat-Sun 7:00 am-11:30 am  Agree with above.  She said that she had several small BM's last night and more today.  But on her PE, she is still distended with rare BS.  Alphonsa Overall, MD, [facility] Surgery Pager: [phone] Office phone:   [phone]

## 2016-07-11 NOTE — Progress Notes (Addendum)
RN called abnormal troponin .09. Chart reviewed. Looks like pt has been complaining on and off with chest pain and troponins have been done on and off since 10/14. Highest one then .06. At present, pt not c/o active chest pain. When RN asks, pt points to epigastric area. Per RN, pt has not complained on her own and has been resting comfortably all night. Will get an EKG to compare to previous.  KJKG, NP [facility] Update: No acute changes noted on EKG and next troponin trended down to .04.  KJKG, NP [facility]

## 2016-07-12 DIAGNOSIS — K56609 Unspecified intestinal obstruction, unspecified as to partial versus complete obstruction: Secondary | ICD-10-CM

## 2016-07-12 DIAGNOSIS — I10 Essential (primary) hypertension: Secondary | ICD-10-CM

## 2016-07-12 DIAGNOSIS — K210 Gastro-esophageal reflux disease with esophagitis: Secondary | ICD-10-CM

## 2016-07-12 DIAGNOSIS — J301 Allergic rhinitis due to pollen: Secondary | ICD-10-CM

## 2016-07-12 LAB — COMPREHENSIVE METABOLIC PANEL
ALT: 33 U/L (ref 14–54)
AST: 28 U/L (ref 15–41)
Albumin: 2.1 g/dL — ABNORMAL LOW (ref 3.5–5.0)
Alkaline Phosphatase: 89 U/L (ref 38–126)
Anion gap: 6 (ref 5–15)
BUN: 13 mg/dL (ref 6–20)
CO2: 28 mmol/L (ref 22–32)
Calcium: 8.1 mg/dL — ABNORMAL LOW (ref 8.9–10.3)
Chloride: 107 mmol/L (ref 101–111)
Creatinine, Ser: 0.55 mg/dL (ref 0.44–1.00)
GFR calc Af Amer: >60 mL/min (ref >60)
GFR calc non Af Amer: >60 mL/min (ref >60)
Glucose, Bld: 132 mg/dL — ABNORMAL HIGH (ref 65–99)
Potassium: 3.4 mmol/L — ABNORMAL LOW (ref 3.5–5.1)
Sodium: 141 mmol/L (ref 135–145)
Total Bilirubin: 0.4 mg/dL (ref 0.3–1.2)
Total Protein: 6 g/dL — ABNORMAL LOW (ref 6.5–8.1)

## 2016-07-12 LAB — GLUCOSE, CAPILLARY
Glucose-Capillary: 127 mg/dL — ABNORMAL HIGH (ref 65–99)
Glucose-Capillary: 127 mg/dL — ABNORMAL HIGH (ref 65–99)
Glucose-Capillary: 146 mg/dL — ABNORMAL HIGH (ref 65–99)
Glucose-Capillary: 118 mg/dL — ABNORMAL HIGH (ref 65–99)
Glucose-Capillary: 114 mg/dL — ABNORMAL HIGH (ref 65–99)

## 2016-07-12 LAB — MAGNESIUM: Magnesium: 2 mg/dL (ref 1.7–2.4)

## 2016-07-12 LAB — PHOSPHORUS: Phosphorus: 2.6 mg/dL (ref 2.5–4.6)

## 2016-07-12 MED ORDER — BOOST / RESOURCE BREEZE PO LIQD
1.0000 | Freq: Two times a day (BID) | ORAL | Status: DC
  Administered 2016-07-13 – 2016-07-16 (×3): 1 via ORAL

## 2016-07-12 MED ORDER — FAT EMULSION 20 % IV EMUL
168.0000 mL | INTRAVENOUS | Status: DC
  Administered 2016-07-12: 168 mL via INTRAVENOUS
  Filled 2016-07-12: qty 200

## 2016-07-12 MED ORDER — PAROXETINE HCL 20 MG PO TABS
40.0000 mg | ORAL_TABLET | Freq: Every day | ORAL | Status: DC
  Administered 2016-07-13 – 2016-07-16 (×4): 40 mg via ORAL
  Filled 2016-07-12 (×5): qty 2

## 2016-07-12 MED ORDER — TRACE MINERALS CR-CU-MN-SE-ZN 10-1000-500-60 MCG/ML IV SOLN
INTRAVENOUS | Status: DC
  Administered 2016-07-12: 17:00:00 via INTRAVENOUS
  Filled 2016-07-12: qty 960

## 2016-07-12 MED ORDER — METOPROLOL TARTRATE 50 MG PO TABS
50.0000 mg | ORAL_TABLET | Freq: Two times a day (BID) | ORAL | Status: DC
  Administered 2016-07-13 – 2016-07-16 (×8): 50 mg via ORAL
  Filled 2016-07-12 (×9): qty 1

## 2016-07-12 MED ORDER — GABAPENTIN 300 MG PO CAPS
300.0000 mg | ORAL_CAPSULE | Freq: Two times a day (BID) | ORAL | Status: DC
  Administered 2016-07-13 – 2016-07-16 (×8): 300 mg via ORAL
  Filled 2016-07-12 (×9): qty 1

## 2016-07-12 MED ORDER — PAROXETINE HCL 20 MG PO TABS: 40.0000 mg | ORAL_TABLET | Freq: Every day | ORAL | Status: DC

## 2016-07-12 NOTE — Progress Notes (Signed)
PROGRESS NOTE        PATIENT DETAILS Name: Amy [name] Age: 70 y.o. Sex: female Date of Birth: Mar 11, 1946 Admit Date: 07/02/2016 Admitting Physician Lily Kocher, MD [id number], [facility], MD  Brief Narrative: Patient is a 70 y.o. female with past medical history of hypertension, GERD, DVT (not on anticoagulation), chronic pain syndrome who presented to ED with small bowel obstruction. She was initially managed with conservative measures, but subsequently underwent exploratory laparotomy and lysis of adhesions on 10/13.  Subjective: 1 small bowel movement earlier this morning. Main complaint is hoarseness of voice.  Assessment/Plan: Principal Problem: SBO (small bowel obstruction): Initially conservative measures attempted, however required exploratory laparotomy with lysis of adhesions. Postoperatively, placed on TNA. Her bowel function seems to becoming back, NG tube has now been discontinued by general surgery and she has been started on a clear liquid diet. Suspect TNA can be discontinued soon once oral intake is more stable. Postoperative care including wound care deferred to general surgery at this point.   Active Problems: Hypokalemia: In today to repeat via TNA. Suspect secondary to GI loss from NG tube.  Sinus tachycardia: Improving. Felt to be secondary to sympathetic response from anxiety/NG tube placement, TSH within normal limits, CT angiogram of the chest negative for pulmonary embolism. 2-D echocardiogram with preserved ejection fraction.  Hypertension: Uncontrolled, continue transdermal clonidine, start oral metoprolol. Will slowly resume rest of her other antihypertensives.  GERD: Continue PPI.  Elevated lipase levels: Probably secondary to bowel obstruction rather than pancreatitis. CT scan of the abdomen on 10/9 and 10/12  did not show any obvious pancreatitis  Allergic rhinitis: Continue Flonase  Anxiety/depression: Resume  Celexa, hopefully with clinical improvement in her anxiety levels will abate.  History of neuropathy/chronic pain syndrome: Resume gabapentin.  DVT Prophylaxis: Prophylactic Lovenox   Code Status:  DNR  Family Communication: None at bedside  Disposition Plan: Remain inpatient-but will plan on Home health vs SNF on discharge  Antimicrobial agents: None  Procedures: LAPAROSCOPY DIAGNOSTIC EMERGENT TO OPEN EXPLORATORY LAPAROTOMY, LYSIS OF ADHESIONS - 07/06/2016  CONSULTS:  general surgery  Time spent: 25- minutes-Greater than 50% of this time was spent in counseling, explanation of diagnosis, planning of further management, and coordination of care.  MEDICATIONS: Anti-infectives    Start     Dose/Rate Route Frequency Ordered Stop   07/06/16 1600  ciprofloxacin (CIPRO) IVPB 400 mg     400 mg 200 mL/hr over 60 Minutes Intravenous To [facility] 07/06/16 0956 07/06/16 1254      Scheduled Meds: . chlorhexidine  15 mL Mouth Rinse BID  . cloNIDine  0.1 mg Transdermal Q Tue  . enoxaparin (LOVENOX) injection  60 mg Subcutaneous Q24H  . famotidine (PEPCID) IV  20 mg Intravenous Q12H  . insulin aspart  0-9 Units Subcutaneous Q4H  . mouth rinse  15 mL Mouth Rinse q12n4p  . [START ON 07/14/2016] pantoprazole  40 mg Intravenous Q12H   Continuous Infusions: . 0.9 % NaCl with KCl 40 mEq / L 50 mL/hr (07/11/16 1133)  . Marland KitchenTPN (CLINIMIX-E) Adult 35 mL/hr at 07/11/16 1746   And  . fat emulsion 168 mL (07/11/16 1746)  . Marland KitchenTPN (CLINIMIX-E) Adult     And  . fat emulsion     PRN Meds:.acetaminophen **OR** acetaminophen, hydrALAZINE, levalbuterol, loratadine, magic mouthwash, menthol-cetylpyridinium, metoprolol, morphine injection, ondansetron (ZOFRAN) IV, sodium chloride flush  PHYSICAL EXAM: Vital signs: Vitals:   07/11/16 2242 07/11/16 2337 07/12/16 0604 07/12/16 0800  BP: (!) 172/89 (!) 160/81 (!) 172/83 (!) 168/80  Pulse:   99 95  Resp:   17 16  Temp:   98.3 F (36.8  C) 97.8 F (36.6 C)  TempSrc:      SpO2:   96% 97%  Weight:      Height:       Filed Weights   07/09/16 2105 07/11/16 1101 07/11/16 2113  Weight: 122 kg (269 lb) 124.7 kg (275 lb) 121.1 kg (267 lb)   Body mass index is 44.43 kg/m.   General appearance :Awake, alert, not in any distress. Speech Clear.  Eyes:, pupils equally reactive to light and accomodation,no scleral icterus.Pink conjunctiva HEENT: Atraumatic and Normocephalic Neck: supple, no JVD. No cervical lymphadenopathy. No thyromegaly Resp:Good air entry bilaterally, no added sounds  CVS: S1 S2 regular, no murmurs.  GI: Bowel sounds Sluggish, appropriately tender. Not distended. Extremities: B/L Lower Ext shows no edema, both legs are warm to touch Neurology:  speech clear,Non focal, sensation is grossly intact. Psychiatric: Normal judgment and insight. Alert and oriented x 3. Normal mood. Musculoskeletal:gait appears to be normal.No digital cyanosis Skin:No Rash, warm and dry Wounds:N/A  I have personally reviewed following labs and imaging studies  LABORATORY DATA: CBC:  Recent Labs Lab 07/06/16 0553 07/07/16 0712 07/08/16 0554 07/10/16 1407 07/11/16 0433  WBC 5.9 7.2 11.3* 12.2* 11.5*  NEUTROABS  --   --   --   --  9.0*  HGB 12.7 14.5 13.1 12.5 11.2*  HCT 38.8 44.5 38.5 37.0 33.5*  MCV 91.7 91.4 90.4 89.6 89.8  PLT 252 279 232 291 [id number]    Basic Metabolic Panel:  Recent Labs Lab 07/08/16 0554 07/10/16 1407 07/11/16 0433 07/11/16 2000 07/12/16 0450  NA 143 144 141 142 141  K 4.4 3.3* 2.6* 3.1* 3.4*  CL 116* 108 105 109 107  CO2 19* 27 25 27 28   GLUCOSE 144* 105* 91 106* 132*  BUN 21* 11 13 14 13   CREATININE 0.81 0.73 0.77 0.61 0.55  CALCIUM 8.2* 8.5* 8.4* 8.3* 8.1*  MG  --   --  1.9  --  2.0  PHOS  --   --  4.0  --  2.6    GFR: Estimated Creatinine Clearance: 85.3 mL/min (by C-G formula based on SCr of 0.55 mg/dL).  Liver Function Tests:  Recent Labs Lab 07/06/16 0553 07/07/16 0712  07/10/16 1407 07/11/16 0433 07/12/16 0450  AST 27 25 34 38 28  ALT 40 32 32 37 33  ALKPHOS 72 62 88 94 89  BILITOT 0.6 1.3* 0.7 0.6 0.4  PROT 6.6 5.9* 6.8 6.5 6.0*  ALBUMIN 2.8* 2.2* 2.3* 2.1* 2.1*   No results for input(s): LIPASE, AMYLASE in the last 168 hours. No results for input(s): AMMONIA in the last 168 hours.  Coagulation Profile: No results for input(s): INR, PROTIME in the last 168 hours.  Cardiac Enzymes:  Recent Labs Lab 07/07/16 1832 07/10/16 1407 07/10/16 1907 07/10/16 2231 07/11/16 0433  TROPONINI 0.03* <0.03 <0.03 0.09* 0.04*    BNP (last 3 results) No results for input(s): PROBNP in the last 8760 hours.  HbA1C: No results for input(s): HGBA1C in the last 72 hours.  CBG:  Recent Labs Lab 07/11/16 2048 07/11/16 2345 07/12/16 0335 07/12/16 0816 07/12/16 1124  GLUCAP 95 108* 127* 127* 146*    Lipid Profile:  Recent Labs  07/11/16 0433  TRIG  101    Thyroid Function Tests: No results for input(s): TSH, T4TOTAL, FREET4, T3FREE, THYROIDAB in the last 72 hours.  Anemia Panel: No results for input(s): VITAMINB12, FOLATE, FERRITIN, TIBC, IRON, RETICCTPCT in the last 72 hours.  Urine analysis:    Component Value Date/Time   COLORURINE AMBER (A) 07/03/2016 0721   APPEARANCEUR CLEAR 07/03/2016 0721   LABSPEC >1.046 (H) 07/03/2016 0721   PHURINE 6.0 07/03/2016 0721   GLUCOSEU NEGATIVE 07/03/2016 0721   HGBUR NEGATIVE 07/03/2016 0721   BILIRUBINUR SMALL (A) 07/03/2016 0721   KETONESUR NEGATIVE 07/03/2016 0721   PROTEINUR 100 (A) 07/03/2016 0721   UROBILINOGEN 1.0 04/21/2014 0756   NITRITE NEGATIVE 07/03/2016 0721   LEUKOCYTESUR NEGATIVE 07/03/2016 0721    Sepsis Labs: Lactic Acid, Venous No results found for: LATICACIDVEN  MICROBIOLOGY: Recent Results (from the past 240 hour(s))  MRSA PCR Screening     Status: None   Collection Time: 07/07/16  6:44 PM  Result Value Ref Range Status   MRSA by PCR NEGATIVE NEGATIVE Final     Comment:        The GeneXpert MRSA Assay (FDA approved for NASAL specimens only), is one component of a comprehensive MRSA colonization surveillance program. It is not intended to diagnose MRSA infection nor to guide or monitor treatment for MRSA infections.     RADIOLOGY STUDIES/RESULTS: Dg Chest 2 View  Result Date: 07/02/2016 CLINICAL DATA:  Mid lower chest and left sided chest pain up into left shoulder. Severe abd pain and tightness. Dizziness. N/V. SOB. Rapid heartbeat. Skin feels cold - almost clammy, but she states she is burning up. Symptoms have increased in severity and frequency over the last week. EXAM: CHEST  2 VIEW COMPARISON:  01/05/2016 FINDINGS: Lung volumes are low. There is linear/streaky type opacity at the lung bases there is most likely atelectasis. Infection is possible. Remainder of the lungs is clear. No pleural effusion or pneumothorax. Cardiac silhouette is normal in size. No mediastinal or hilar masses. No evidence of adenopathy. Skeletal structures are intact. IMPRESSION: 1. Lung base opacity accentuated by low lung volumes. This is most likely atelectasis. Consider pneumonia if there are consistent clinical symptoms. 2. No other evidence of acute cardiopulmonary disease. Electronically Signed   By: Lajean Manes M.D.   On: 07/02/2016 16:38   Dg Cervical Spine Complete  Result Date: 06/28/2016 CLINICAL DATA:  Chronic neck pain, no known injury, initial encounter EXAM: CERVICAL SPINE - COMPLETE 4+ VIEW COMPARISON:  None. FINDINGS: Seven cervical segments are well visualized. Vertebral body height is well maintained. No prevertebral soft tissue changes are seen. Mild facet hypertrophic changes are noted. No significant neural foraminal narrowing is seen. The odontoid is within normal limits. IMPRESSION: Mild degenerative change without acute abnormality. Electronically Signed   By: Inez Catalina M.D.   On: 06/28/2016 08:38   Dg Lumbar Spine Complete W/bend  Result  Date: 06/28/2016 CLINICAL DATA:  Low back pain, no known injury, initial encounter EXAM: LUMBAR SPINE - COMPLETE WITH BENDING VIEWS COMPARISON:  05/22/2016 FINDINGS: Multilevel postoperative changes are noted throughout the lumbar spine. The overall appearance of the hardware is stable. Vertebral body height is well maintained. Aortic calcifications are seen. Flexion and extension views show no definitive instability. IMPRESSION: Multilevel postoperative changes. No instability is seen on flexion and extension. No acute abnormality is noted. Electronically Signed   By: Inez Catalina M.D.   On: 06/28/2016 08:40   Dg Si Joints  Result Date: 06/28/2016 CLINICAL DATA:  Chronic  pain.  No reported injury . EXAM: BILATERAL SACROILIAC JOINTS - 3+ VIEW COMPARISON:  CT 06/01/2016.  MRI 02/08/2016 FINDINGS: Lumbosacral spine fusion. Degenerative changes lumbar spine and both SI joints. Degenerative changes both hips. Osteitis pubis again noted. No acute abnormality identified. Pelvic calcifications consistent phleboliths noted. IMPRESSION: Lumbosacral spine fusion. Degenerative changes lumbar spine, both SI joints, both hips. Osteitis pubis again noted. No acute abnormality identified. Electronically Signed   By: Marcello Moores  Register   On: 06/28/2016 06:53   Dg Shoulder Right  Result Date: 06/28/2016 CLINICAL DATA:  Chronic pain.  No recent prior. EXAM: RIGHT SHOULDER - 2+ VIEW COMPARISON:  01/05/2016. FINDINGS: Acromioclavicular glenohumeral degenerative change. Subacromial spurring is noted. No evidence of fracture or dislocation. IMPRESSION: 1.  No acute abnormality. 2. Degenerative changes right shoulder with subacromial spurring. Electronically Signed   By: Marcello Moores  Register   On: 06/28/2016 06:47   Dg Knee 1-2 Views Left  Result Date: 06/28/2016 CLINICAL DATA:  Knee pain common no known injury, initial encounter EXAM: LEFT KNEE - 1-2 VIEW COMPARISON:  None. FINDINGS: No acute fracture or dislocation is noted. No  joint effusion is seen. Very minimal medial joint space narrowing is noted. IMPRESSION: Minimal degenerative change without acute abnormality. Electronically Signed   By: Inez Catalina M.D.   On: 06/28/2016 08:38   Dg Knee 1-2 Views Right  Result Date: 06/28/2016 CLINICAL DATA:  Right knee pain, no known injury, initial encounter EXAM: RIGHT KNEE - 1-2 VIEW COMPARISON:  None. FINDINGS: No evidence of fracture, dislocation, or joint effusion. Mild medial joint space narrowing is noted. No soft tissue abnormality is seen. IMPRESSION: Minimal degenerative changes. Electronically Signed   By: Inez Catalina M.D.   On: 06/28/2016 08:39   Ct Angio Chest Pe W Or Wo Contrast  Result Date: 07/07/2016 CLINICAL DATA:  Exploratory laparotomy yesterday. Shortness of breath. Evaluate for pulmonary embolus. EXAM: CT ANGIOGRAPHY CHEST WITH CONTRAST TECHNIQUE: Multidetector CT imaging of the chest was performed using the standard protocol during bolus administration of intravenous contrast. Multiplanar CT image reconstructions and MIPs were obtained to evaluate the vascular anatomy. CONTRAST:  100 cc Isovue 370 COMPARISON:  None. FINDINGS: Cardiovascular: Heart size upper normal. No pericardial effusion. No thoracic aortic aneurysm. Assessment of segmental and subsegmental pulmonary arteries to the lower lobes is degraded by respiratory motion, but within this limitation there is no CT evidence for an acute pulmonary embolus. Mediastinum/Nodes: No mediastinal lymphadenopathy. There is no hilar lymphadenopathy. There is no axillary lymphadenopathy. NG tube passes into the stomach with nonvisualization of the distal tip. Lungs/Pleura: Bilateral lower lobe subsegmental atelectasis noted. Focal bandlike opacity in the lingula likely also atelectatic. 7 mm ground-glass nodule identified right lower lobe (image 59 series 5). No evidence for pneumothorax. No pleural effusion. Upper Abdomen: Stomach is fluid distended despite the  presence of an NG tube. Musculoskeletal: Bone windows reveal no worrisome lytic or sclerotic osseous lesions. Review of the MIP images confirms the above findings. IMPRESSION: 1. No CT evidence for acute pulmonary embolus. 2. Bilateral lower lobe subsegmental atelectasis with probable lingular atelectasis associated. 3. Stomach is fluid distended despite the presence of an NG tube. Electronically Signed   By: Misty Stanley M.D.   On: 07/07/2016 14:31   Ct Abdomen Pelvis W Contrast  Result Date: 07/05/2016 CLINICAL DATA:  70 year old female with recently diagnosed small bowel obstruction and pancreatitis. Continued abdominal pelvic pain with nausea. EXAM: CT ABDOMEN AND PELVIS WITH CONTRAST TECHNIQUE: Multidetector CT imaging of the abdomen and  pelvis was performed using the standard protocol following bolus administration of intravenous contrast. CONTRAST:  100 cc intravenous Isovue 300 COMPARISON:  07/02/2016 and prior CTs. FINDINGS: Lower chest: Increased moderate bibasilar atelectasis identified. Hepatobiliary: The liver is unremarkable. The patient is status post cholecystectomy. There is no evidence of biliary dilatation. Pancreas: Unremarkable Spleen: Unremarkable Adrenals/Urinary Tract: The kidneys, adrenal glands and bladder are unremarkable except for unchanged 1.7 cm left adrenal adenoma. Stomach/Bowel: An NG tube is present with tip in the mid stomach. Dilated proximal-mid small bowel loops are again identified with collapsed distal small bowel loops, compatible with small bowel obstruction. There has been interval decreased caliber of stomach and dilated small bowel loops since the prior study. There is no evidence of definite bowel wall thickening or pneumoperitoneum. Vascular/Lymphatic: Abdominal aortic atherosclerotic calcifications noted without aneurysm. No enlarged lymph nodes identified. Reproductive: Patient is status post hysterectomy. No adnexal masses identified. Other: No free fluid or  focal collection/abscess. Musculoskeletal: Surgical changes within the lumbar spine again identified. No acute or suspicious abnormality. IMPRESSION: Small bowel obstruction again noted, with decompression of stomach and decreased caliber of dilated small bowel loops from NG tube within the stomach. No evidence of abscess, free fluid or pneumoperitoneum. Increased moderate bibasilar atelectasis. Abdominal aortic atherosclerosis. Electronically Signed   By: Margarette Canada M.D.   On: 07/05/2016 13:26   Ct Abdomen Pelvis W Contrast  Result Date: 07/02/2016 CLINICAL DATA:  Epigastric abdominal pain for 5 days. Vomiting starting last night. Prior hysterectomy. Prior abdominal adhesion removal. EXAM: CT ABDOMEN AND PELVIS WITH CONTRAST TECHNIQUE: Multidetector CT imaging of the abdomen and pelvis was performed using the standard protocol following bolus administration of intravenous contrast. CONTRAST:  126mL ISOVUE-300 IOPAMIDOL (ISOVUE-300) INJECTION 61% COMPARISON:  01/30/2016 FINDINGS: Lower chest: Bibasilar atelectasis. Normal heart size without pericardial or pleural effusion. Fluid in the distal esophagus. Hepatobiliary: Normal liver. Cholecystectomy, without biliary ductal dilatation. Pancreas: Normal, without mass or ductal dilatation. Spleen: Normal in size, without focal abnormality. Adrenals/Urinary Tract: Normal right adrenal gland. Left adrenal nodule measures 1.7 cm and is been similar in back to 06/23/2015, consistent with an adenoma on that study. Normal kidneys, without hydronephrosis. Normal urinary bladder. Stomach/Bowel: The stomach is distended and fluid-filled. The colon is normal to minimally decompressed distally. Decompressed terminal and distal ileum. Proximal and mid small bowel loops are dilated, including at 3.7 cm on image 56/series 2. Relatively gradual transition occurs, including within left paracentral anteriorly positioned small bowel loops on image 52/series 2. No obstructive mass. No  findings to suggest complicating ischemia. Minimal nonspecific mesenteric interloop edema is seen including on image 56/series 2. Vascular/Lymphatic: Advanced aortic and branch vessel atherosclerosis. No abdominopelvic adenopathy. Reproductive: Hysterectomy.  No adnexal mass. Other: No significant free fluid. No pneumatosis or free intraperitoneal air. Musculoskeletal: Lumbosacral spine fixation. IMPRESSION: 1. Mid small bowel obstruction, likely secondary to adhesions. No complicating ischemia. 2. Distended, fluid-filled stomach with fluid in the esophagus. The patient would likely benefit from nasogastric tube placement. 3. Advanced aortic atherosclerosis. 4. Left adrenal adenoma. Electronically Signed   By: Abigail Miyamoto M.D.   On: 07/02/2016 20:44   Dg Chest Port 1 View  Result Date: 07/10/2016 CLINICAL DATA:  Shortness of breath, hypertension, asthma EXAM: PORTABLE CHEST 1 VIEW COMPARISON:  07/07/2016 FINDINGS: Cardiomediastinal silhouette is stable. Mild basilar atelectasis. No pulmonary edema. NG tube in place. Linear atelectasis or infiltrate in lingula. IMPRESSION: No pulmonary edema. Mild basilar atelectasis. Linear atelectasis or infiltrate in lingula. Electronically Signed   By: Julien Girt  Pop M.D.   On: 07/10/2016 11:07   Dg Chest Port 1 View  Result Date: 07/05/2016 CLINICAL DATA:  Chest pain EXAM: PORTABLE CHEST 1 VIEW COMPARISON:  07/02/2016 FINDINGS: Cardiomediastinal silhouette is stable. Stable atelectasis or scarring right base. There is streaky atelectasis or infiltrate left base. No pulmonary edema. NG tube poorly visualized due to patient's large body habitus. IMPRESSION: No pulmonary edema. Right base atelectasis or scarring. Streaky left basilar atelectasis or infiltrate. Electronically Signed   By: Lahoma Crocker M.D.   On: 07/05/2016 12:28   Dg Shoulder Left  Result Date: 06/28/2016 CLINICAL DATA:  Chronic pain.  No reported injury. EXAM: LEFT SHOULDER - 2+ VIEW COMPARISON:   08/16/2009. FINDINGS: No acute bony or joint abnormality identified. No evidence of fracture or dislocation. Mild acromioclavicular and glenohumeral degenerative change. IMPRESSION: No acute or focal abnormality. Mild degenerative changes left shoulder . Electronically Signed   By: Marcello Moores  Register   On: 06/28/2016 06:47   Dg Abd Portable 1v  Result Date: 07/05/2016 CLINICAL DATA:  Small-bowel obstruction. EXAM: PORTABLE ABDOMEN - 1 VIEW COMPARISON:  07/04/2016 FINDINGS: NG tube in the stomach. Stomach is decompressed. No dilated loops of bowel identified. No bowel wall edema. Lumbar fusion hardware unchanged. IMPRESSION: Nonobstructive bowel gas pattern.  NG tube remains in the stomach. Electronically Signed   By: Franchot Gallo M.D.   On: 07/05/2016 08:43   Dg Abd Portable 1v-small Bowel Obstruction Protocol-24 Hr Delay  Result Date: 07/04/2016 CLINICAL DATA:  Delayed image for assessment of small bowel obstruction. EXAM: PORTABLE ABDOMEN - 1 VIEW COMPARISON:  07/03/2016 FINDINGS: Contrast injected into the stomach under previous exam is no longer visualized. It has passed. There is no evidence of small-bowel obstruction. Nasogastric tube is well positioned within the mid stomach. Bowel gas pattern is unremarkable. IMPRESSION: 1. No evidence of a small-bowel obstruction. Electronically Signed   By: Lajean Manes M.D.   On: 07/04/2016 09:15   Dg Abd Portable 1v-small Bowel Obstruction Protocol-initial, 8 Hr Delay  Result Date: 07/03/2016 CLINICAL DATA:  70 year old female with small bowel obstruction. Subsequent encounter. EXAM: PORTABLE ABDOMEN - 1 VIEW COMPARISON:  07/02/2016 plain film and CT. FINDINGS: Nasogastric tube with tip gastric body level. Contrast and gas-filled dilated stomach. Gas-filled small bowel loops. The caliber of the small bowel loops is not adequately assessed by plain film examination (as small bowel loops may be more fluid-filled than gas-filled when compared to prior exam)  and small bowel obstructive pattern may still be present. The possibility of free intraperitoneal air cannot be assessed on a supine view. Postsurgical changes lumbar spine. IMPRESSION: Contrast and gas-filled dilated stomach. Gas-filled small bowel loops. The caliber of the small bowel loops is not adequately assessed by plain film examination (as small bowel loops may be more fluid-filled than gas-filled when compared to prior exam) and small bowel obstructive pattern may still be present. Electronically Signed   By: Genia Del M.D.   On: 07/03/2016 15:51   Dg Abd Portable 1 View  Result Date: 07/02/2016 CLINICAL DATA:  NG tube placement. EXAM: PORTABLE ABDOMEN - 1 VIEW COMPARISON:  CT abdomen/ pelvis earlier this day FINDINGS: Tip and side port of the enteric tube below the diaphragm in the stomach. Gaseous gastric distention. Small bowel dilatation is partially included. Bibasilar atelectasis is seen. IMPRESSION: Tip and side port of the enteric tube below the diaphragm in the stomach. Electronically Signed   By: Jeb Levering M.D.   On: 07/02/2016 23:25   Dg  Hip Unilat W Or W/o Pelvis 2-3 Views Left  Result Date: 06/28/2016 CLINICAL DATA:  Chronic pain. EXAM: DG HIP (WITH OR WITHOUT PELVIS) 2-3V LEFT COMPARISON:  05/22/2016.  MRI 02/08/2016. FINDINGS: Prior lumbosacral spine fusion. Degenerative changes lumbar spine, both SI joints, both hips. Osteitis pubis again noted. IMPRESSION: No acute abnormality. Prior lumbosacral spine fusion. Degenerative changes both SI joints and hips. Osteitis pubis again noted. Electronically Signed   By: Marcello Moores  Register   On: 06/28/2016 06:50   Dg Hip Unilat W Or W/o Pelvis 2-3 Views Right  Result Date: 06/28/2016 CLINICAL DATA:  Chronic pain.  No reported injury. EXAM: DG HIP (WITH OR WITHOUT PELVIS) 2-3V RIGHT COMPARISON:  CT 06/01/2016.  MRI 02/08/2016. FINDINGS: Lumbosacral spine fusion. Degenerative changes lumbar spine, both SI joints, both hips. Osteitis  pubis again noted. No acute abnormality. No evidence of fracture dislocation. IMPRESSION: Lumbosacral spine fusion. Degenerative changes lumbar spine, both SI joints, both hips. Osteitis pubis again noted. No acute bony abnormality identified . Electronically Signed   By: Marcello Moores  Register   On: 06/28/2016 06:51   US Abdomen Limited Ruq  Result Date: 07/03/2016 CLINICAL DATA:  Elevated LFTs.  Initial encounter. EXAM: US ABDOMEN LIMITED - RIGHT UPPER QUADRANT COMPARISON:  CT of the abdomen and pelvis from 07/02/2016 FINDINGS: Gallbladder: Status post cholecystectomy.  No retained stones seen. Common bile duct: Diameter: 0.3 cm, within normal limits in caliber. Difficult to fully characterize due to overlying structures. Liver: No focal lesion identified. Within normal limits in parenchymal echogenicity. IMPRESSION: Status post cholecystectomy. No retained stones seen. Unremarkable ultrasound of the right upper quadrant. On further assessment of the recent CT of the abdomen and pelvis, there is soft tissue inflammation tracking about the pancreatic head and second segment of the duodenum. Given clinical concern, this could reflect mild acute pancreatitis. There is no evidence of devascularization or pseudocyst formation on the recent prior CT. Note that the pancreas is typically not well assessed on ultrasound. Electronically Signed   By: Garald Balding M.D.   On: 07/03/2016 03:58     LOS: 10 days   Oren Binet, MD  [facility] Pager:[phone]  If 7PM-7AM, please contact night-coverage [email or web address] Password [email or web address] 07/12/2016, 12:31 PM

## 2016-07-12 NOTE — Progress Notes (Signed)
[place] NOTE   Pharmacy Consult for TPN Indication: Prolonged ileus  Patient Measurements: Height: 5\' 5"  (165.1 cm) Weight: 267 lb (121.1 kg) IBW/kg (Calculated) : 57 TPN AdjBW (KG): 73.7 Body mass index is 44.43 kg/m.  Assessment:  76 YOF who presented on 10/9 with abdominal pain, N/V. CT showed a SBO and surgery was consulted. NGT was placed on 10/10 and the patient was made NPO. The patient underwent ex-lap with lysis of adhesions on 10/14. Post-op the patient has remained NPO with no flatus or BM.   GI: SBO s/p ex-lap with lysis of adhesions on 10/14. NPO x 7d - PICC placed later on 10/17 so TPN started 10/18. Pre-albumin 9.5, LBM 10/18 x 2 & feeling better, abd soft & (+)BS.  NGT with no output recorded 10/18 & d/c'd today.  Starting CLs.  Plan to remove VAC 10/20 Endo: CBGs 106 & 132.   * Insulin requirements in the past 24 hours: 3 units Lytes: K 3.4 - improved s/p K-runs x 10 and IVF NS + 40 mEq @ 50 ml/hr, Mg 2, Phos 2.6. Pt will be at risk for refeeding syndrome due to prolonged NPO - will monitor electrolytes closely and only advance as tolerated. Renal: SCr 0.55, CrCl~70-80 ml/min (normalized).  UOP 320ml + 4x. Pulm: 96%/RA Cards: BP wnl-elevated, HR wnl-tachy. On clonidine patch.  Hepatobil: LFTs/Tbili WNL Neuro: Pain score/24h: 3-8 (abdomen). On prn morphine ID: Afebrile, WBC 11.3, on no abx  Best Practices: Enox 60 mg/day (0.5 mg/kg) for VTE px with BMI>30 TPN Access: R-double lumen PICC (placed 10/17) TPN start date: 10/18  Nutritional Goals (per RD recommendations on 10/17): KCal: [id number] Protein: 110-130g  Goal Rate:  Clinimix E 5/15 at 83 ml/hr and 20% lipid emulsion at 10 ml/hr will provide ~1900 kcal and ~100g protein (100% of kcal and 91% of protein goal) * This goal rate intentionally does not meet protein goals due to the impending Clinimix shortage and attempting to keep to a 2L bag size   Current  Nutrition: NPO  Plan:  - Cont Clinimix E 5/15 at 35 ml/hr and 20% lipid emulsion at 7 ml/hr as pt improving in order to conserve TPN in setting of backorder - this provides 42 g of protein and 932 kCals per day meeting 38% of protein and 49% of kCal needs - Add MVI and trace elements in TPN - Add sensitive SSI + CBG checks q4h and adjust as needed - BMet, Mg, Phos in AM - Will follow-up electrolytes, CBGs, and TPN tolerance - K runs x 4 per MD   Gracy Bruins, PharmD Clinical Pharmacist [facility]

## 2016-07-12 NOTE — Progress Notes (Signed)
Nutrition Follow-up  DOCUMENTATION CODES:   Morbid obesity  INTERVENTION:  TPN per Pharmacy.  Provide Boost Breeze po BID, each supplement provides 250 kcal and 9 grams of protein.  RD to continue to monitor.   NUTRITION DIAGNOSIS:   Inadequate oral intake related to altered GI function as evidenced by NPO status; diet advanced to clear liquids  GOAL:   Patient will meet greater than or equal to 90% of their needs; progressing  MONITOR:   PO intake, Supplement acceptance, Labs, Weight trends, Skin, I & O's, Other (Comment) (TPN)  REASON FOR ASSESSMENT:   NPO/Clear Liquid Diet (8 days)    ASSESSMENT:   70 y.o. woman with a history of HTN, GERD, DVT (she is not on anticoagulation currently), and chronic pain who presents to the ED for evaluation of abdominal pain x 5 five days.   . She was also found to have an elevated lipase, CT scan showed small bowel obstruction. General surgery was consulted and patient was admitted to medicine service. Patient underwent exploratory laparoscopy on 10/13.  Diet has been advanced to a clear liquid. NGT discontinued. Per MD note, bowel function seems to be coming back and TPN to be discontinued once po intake is more stable. Per Pharmacy note, Clinimix E 5/15at 60ml/hrand 20% lipid emulsion at 7 ml/hr as pt improving in order to conserve TPN in setting of backorder- this provides 42g of protein and 932kCals per day meeting 38% of protein and 49% of kCal needs. RD to order Boost Breeze to aid in caloric and protein needs.   Labs and medications reviewed.   Diet Order:  TPN (CLINIMIX-E) Adult Diet clear liquid Room service appropriate? Yes; Fluid consistency: Thin TPN (CLINIMIX-E) Adult  Skin:  Wound (see comment) (Incision on abdomen with wound VAC)  Last BM:  10/19  Height:   Ht Readings from Last 1 Encounters:  07/03/16 5\' 5"  (1.651 m)    Weight:   Wt Readings from Last 1 Encounters:  07/11/16 267 lb (121.1 kg)     Ideal Body Weight:  56.8 kg  BMI:  Body mass index is 44.43 kg/m.  Estimated Nutritional Needs:   Kcal:  [id number]  Protein:  110-130 grams  Fluid:  1.9 - 2 L/day  EDUCATION NEEDS:   No education needs identified at this time  Corrin Parker, MS, RD, LDN Pager # [phone] After hours/ weekend pager # [phone]

## 2016-07-12 NOTE — Progress Notes (Signed)
6 Days Post-Op  Subjective: Had miltiple BMs and is feeling better  Objective: Vital signs in last 24 hours: Temp:  [97.5 F (36.4 C)-98.7 F (37.1 C)] 97.8 F (36.6 C) (10/19 0800) Pulse Rate:  [95-100] 95 (10/19 0800) Resp:  [16-19] 16 (10/19 0800) BP: (145-173)/(59-100) 168/80 (10/19 0800) SpO2:  [96 %-100 %] 97 % (10/19 0800) Weight:  [121.1 kg (267 lb)] 121.1 kg (267 lb) (10/18 2113) Last BM Date: 07/11/16  Intake/Output from previous day: 10/18 0701 - 10/19 0700 In: 0  Out: 300 [Urine:300] Intake/Output this shift: No intake/output data recorded.  General appearance: cooperative Resp: clear to auscultation bilaterally GI: soft, active BS, VAC on  Lab Results:   Recent Labs  07/10/16 1407 07/11/16 0433  WBC 12.2* 11.5*  HGB 12.5 11.2*  HCT 37.0 33.5*  PLT 291 275   BMET  Recent Labs  07/11/16 2000 07/12/16 0450  NA 142 141  K 3.1* 3.4*  CL 109 107  CO2 27 28  GLUCOSE 106* 132*  BUN 14 13  CREATININE 0.61 0.55  CALCIUM 8.3* 8.1*   PT/INR No results for input(s): LABPROT, INR in the last 72 hours. ABG  Recent Labs  07/11/16 1245  PHART 7.537*  HCO3 24.5    Studies/Results: Dg Chest Port 1 View  Result Date: 07/10/2016 CLINICAL DATA:  Shortness of breath, hypertension, asthma EXAM: PORTABLE CHEST 1 VIEW COMPARISON:  07/07/2016 FINDINGS: Cardiomediastinal silhouette is stable. Mild basilar atelectasis. No pulmonary edema. NG tube in place. Linear atelectasis or infiltrate in lingula. IMPRESSION: No pulmonary edema. Mild basilar atelectasis. Linear atelectasis or infiltrate in lingula. Electronically Signed   By: Lahoma Crocker M.D.   On: 07/10/2016 11:07    Anti-infectives: Anti-infectives    Start     Dose/Rate Route Frequency Ordered Stop   07/06/16 1600  ciprofloxacin (CIPRO) IVPB 400 mg     400 mg 200 mL/hr over 60 Minutes Intravenous To [facility] 07/06/16 0956 07/06/16 1254      Assessment/Plan: s/p  Procedure(s): LAPAROSCOPY DIAGNOSTIC EMERGENT TO OPEN (N/A) EXPLORATORY LAPAROTOMY LYSIS OF ADHESIONS FOR 3 HOURS (N/A) POD#6 D/C NGT Start clears Remove Prevena VAC tomorrow  LOS: 10 days    THOMPSON,BURKE E 07/12/2016

## 2016-07-13 DIAGNOSIS — G8929 Other chronic pain: Secondary | ICD-10-CM

## 2016-07-13 DIAGNOSIS — K219 Gastro-esophageal reflux disease without esophagitis: Secondary | ICD-10-CM

## 2016-07-13 LAB — BASIC METABOLIC PANEL
Anion gap: 5 (ref 5–15)
BUN: 12 mg/dL (ref 6–20)
CO2: 29 mmol/L (ref 22–32)
Calcium: 8 mg/dL — ABNORMAL LOW (ref 8.9–10.3)
Chloride: 105 mmol/L (ref 101–111)
Creatinine, Ser: 0.57 mg/dL (ref 0.44–1.00)
GFR calc Af Amer: >60 mL/min (ref >60)
GFR calc non Af Amer: >60 mL/min (ref >60)
Glucose, Bld: 107 mg/dL — ABNORMAL HIGH (ref 65–99)
Potassium: 3.6 mmol/L (ref 3.5–5.1)
Sodium: 139 mmol/L (ref 135–145)

## 2016-07-13 LAB — GLUCOSE, CAPILLARY
Glucose-Capillary: 113 mg/dL — ABNORMAL HIGH (ref 65–99)
Glucose-Capillary: 114 mg/dL — ABNORMAL HIGH (ref 65–99)
Glucose-Capillary: 115 mg/dL — ABNORMAL HIGH (ref 65–99)
Glucose-Capillary: 91 mg/dL (ref 65–99)
Glucose-Capillary: 92 mg/dL (ref 65–99)
Glucose-Capillary: 99 mg/dL (ref 65–99)

## 2016-07-13 LAB — MAGNESIUM: Magnesium: 2 mg/dL (ref 1.7–2.4)

## 2016-07-13 LAB — PHOSPHORUS: Phosphorus: 2.9 mg/dL (ref 2.5–4.6)

## 2016-07-13 MED ORDER — HYDRALAZINE HCL 25 MG PO TABS
25.0000 mg | ORAL_TABLET | Freq: Three times a day (TID) | ORAL | Status: DC
  Administered 2016-07-13 – 2016-07-16 (×10): 25 mg via ORAL
  Filled 2016-07-13 (×10): qty 1

## 2016-07-13 MED ORDER — CLONIDINE HCL 0.1 MG PO TABS
0.1000 mg | ORAL_TABLET | Freq: Three times a day (TID) | ORAL | Status: DC
  Administered 2016-07-13 – 2016-07-16 (×10): 0.1 mg via ORAL
  Filled 2016-07-13 (×10): qty 1

## 2016-07-13 MED ORDER — MELOXICAM 7.5 MG PO TABS
15.0000 mg | ORAL_TABLET | Freq: Every day | ORAL | Status: DC
  Administered 2016-07-13 – 2016-07-16 (×4): 15 mg via ORAL
  Filled 2016-07-13 (×4): qty 2
  Filled 2016-07-13: qty 1

## 2016-07-13 MED ORDER — PANTOPRAZOLE SODIUM 40 MG PO TBEC
40.0000 mg | DELAYED_RELEASE_TABLET | Freq: Two times a day (BID) | ORAL | Status: DC
  Administered 2016-07-13 – 2016-07-16 (×7): 40 mg via ORAL
  Filled 2016-07-13 (×7): qty 1

## 2016-07-13 MED ORDER — FAMOTIDINE 20 MG PO TABS
20.0000 mg | ORAL_TABLET | Freq: Two times a day (BID) | ORAL | Status: DC
  Administered 2016-07-13 – 2016-07-16 (×7): 20 mg via ORAL
  Filled 2016-07-13 (×7): qty 1

## 2016-07-13 NOTE — Progress Notes (Signed)
PARENTERAL NUTRITION CONSULT NOTE - FOLLOW UP  Pharmacy Consult for TPN Indication: Prolonged ileus  Allergies  Allergen Reactions  . Latex Other (See Comments)    Sores, blisters - reaction to gloves  . Penicillins Other (See Comments)    Pt was told she was allergic to penicillin in her mid 20'2 - does not recall the reaction. She has taken amoxicillin and ampicillin with no reaction.     Patient Measurements: Height: 5\' 5"  (165.1 cm) Weight: 268 lb (121.6 kg) IBW/kg (Calculated) : 57 Adjusted Body Weight: 74 BMI 44.4  Vital Signs: Temp: 98.7 F (37.1 C) (10/20 0527) BP: 175/92 (10/20 0527) Pulse Rate: 90 (10/20 0527) Intake/Output from previous day: 10/19 0701 - 10/20 0700 In: 2442.3 [P.O.:120; I.V.:2172.3; IV Piggyback:150] Out: 0  Intake/Output from this shift: No intake/output data recorded.  Labs:  Recent Labs  07/10/16 1407 07/11/16 0433  WBC 12.2* 11.5*  HGB 12.5 11.2*  HCT 37.0 33.5*  PLT 291 275     Recent Labs  07/10/16 1407 07/11/16 0433 07/11/16 2000 07/12/16 0450 07/13/16 0500  NA 144 141 142 141 139  K 3.3* 2.6* 3.1* 3.4* 3.6  CL 108 105 109 107 105  CO2 27 25 27 28 29   GLUCOSE 105* 91 106* 132* 107*  BUN 11 13 14 13 12   CREATININE 0.73 0.77 0.61 0.55 0.57  CALCIUM 8.5* 8.4* 8.3* 8.1* 8.0*  MG  --  1.9  --  2.0 2.0  PHOS  --  4.0  --  2.6 2.9  PROT 6.8 6.5  --  6.0*  --   ALBUMIN 2.3* 2.1*  --  2.1*  --   AST 34 38  --  28  --   ALT 32 37  --  33  --   ALKPHOS 88 94  --  89  --   BILITOT 0.7 0.6  --  0.4  --   PREALBUMIN  --  9.5*  --   --   --   TRIG  --  101  --   --   --    Estimated Creatinine Clearance: 85.5 mL/min (by C-G formula based on SCr of 0.57 mg/dL).    Recent Labs  07/12/16 2018 07/13/16 0030 07/13/16 0420  GLUCAP 114* 115* 113*    Medications:  Scheduled:  . chlorhexidine  15 mL Mouth Rinse BID  . cloNIDine  0.1 mg Transdermal Q Tue  . enoxaparin (LOVENOX) injection  60 mg Subcutaneous Q24H  .  famotidine (PEPCID) IV  20 mg Intravenous Q12H  . feeding supplement  1 Container Oral BID BM  . gabapentin  300 mg Oral BID  . insulin aspart  0-9 Units Subcutaneous Q4H  . mouth rinse  15 mL Mouth Rinse q12n4p  . metoprolol tartrate  50 mg Oral BID  . [START ON 07/14/2016] pantoprazole  40 mg Intravenous Q12H  . PARoxetine  40 mg Oral Daily    Assessment: 41 YOF who presented on 10/9 with abdominal pain, N/V. CT showed a SBO and surgery was consulted. NGT was placed on 10/10 and the patient was made NPO. The patient underwent ex-lap with lysis of adhesions on 10/14. Post-op the patient has remained NPO with no flatus or BM.   GI:SBO s/p ex-lap with lysis of adhesions on 10/14. NPO. Pre-albumin 9.5, LBM 10/18 x 2 & feeling better, abd soft & (+)BS.  NGT d/c'd 10/19.  Starting CLs.  Plan to remove VAC 10/20.  Pepcid & Protonix IV.  Discussed stopping TPN with Dr.Thompson d/t improved bowel fxn Endo:CBGs 106 & 132.  Sens SSI, no insulin in TPN * Insulin requirements in the past 24 hours: 0 units Lytes:K 3.6, Mg 2, Phos 2.9.   - NS + 40 mEq @ 50 ml/hr Renal:SCr 0.55, CrCl~70-80 ml/min (normalized).  UOP x 2 Pulm: 96%/RA Cards:BP elevated, HR wnl-tachy. On clonidine patch, [place] PO added.  Hepatobil:LFTs/Tbili WNL Neuro:Pain score/24h: 3-8 (abdomen). On prn morphine, Paxil, Gabapentin [id number], WBC 11.5, on no abx  Best Practices:Enox 60 mg/day (0.5 mg/kg) for VTE px with BMI>30 TPN Access:R-double lumen PICC (placed 10/17) TPN start date:10/18  Nutritional Goals (per RD recommendations on 10/17): [id number] Protein: 110-130g  Goal Rate:  Clinimix E 5/15 at 83 ml/hr and 20% lipid emulsion at 10 ml/hr will provide ~1900 kcal and ~100g protein (100% of kcal and 91% of protein goal) * This goal rate intentionally does not meet protein goals due to the impending Clinimix shortage and attempting to keep to a 2L bag size   Current Nutrition: NPO  Plan: D/C  TPN D/C all TPN labs  Gracy Bruins, PharmD [facility]

## 2016-07-13 NOTE — Consult Note (Signed)
[facility] Nurse wound consult note Reason for Consult: remove Prevena from midline surgical wound. Verified with CCS PA ok to remove Wound type: surgical  Wound bed: approximated and stapled Drainage (amount, consistency, odor) some mild serosanguinous oozing with dressing removal Periwound:intact Dressing procedure/placement/frequency: Prevena removed and discarded. Dry dressing applied, notified bedside nurse of slight oozing and to monitor.   Discussed POC with patient and bedside nurse.  Re consult if needed, will not follow at this time. Thanks  Melody [employer], RN,CNS, [place] [phone])

## 2016-07-13 NOTE — NC FL2 (Signed)
[place] MEDICAID FL2 LEVEL OF CARE SCREENING TOOL     IDENTIFICATION  Patient Name: Amy [name] Birthdate: 15-Oct-1945 Sex: female Admission Date (Current Location): 07/02/2016  [facility] and [place] Number:  Engineering geologist and Address:  [facility] [place] [address] [place], [place] [address]      Provider Number: [id number]  Attending Physician Name and Address:  Jonetta Osgood, MD  Relative Name and Phone Number:       Current Level of Care: Hospital Recommended Level of Care: [place] Prior Approval Number:    Date Approved/Denied:   PASRR Number: [id number]  Discharge Plan: SNF    Current Diagnoses: Patient Active Problem List   Diagnosis Date Noted  . Pancreatitis 07/02/2016  . SBO (small bowel obstruction) 07/02/2016  . Long term current use of opiate analgesic 06/26/2016  . Long term prescription opiate use 06/26/2016  . Opiate use 06/26/2016  . Encounter for therapeutic drug level monitoring 06/26/2016  . Encounter for pain management planning 06/26/2016  . Chronic pain 06/26/2016  . Disturbance of skin sensation 06/26/2016  . Chronic low back pain (Location of Primary Source of Pain) (Bilateral) (R>L) 06/26/2016  . Chronic hip pain (Location of Secondary source of pain) (Bilateral) (R>L) 06/26/2016  . 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 Chronic shoulder pain (Location of Tertiary source of pain) (Bilateral) (R>L) 06/26/2016  . 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 Chronic knee pain (Bilateral) (R>L) 06/26/2016  . 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 Chronic neck pain (Right) 06/26/2016  . Lumbar spondylosis 06/26/2016  . Lumbar facet syndrome 06/26/2016  . Lumbar facet hypertrophy 06/26/2016  . 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 Neurogenic pain 06/26/2016  . Occipital headaches 06/26/2016  . 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 History of lumbar fusion 06/26/2016  . 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  . Neuropathic pain of both legs 10/13/2015  . Constipation 10/13/2015  . Complete rotator cuff tear of left shoulder 05/05/2014  . Depression 08/06/2013  . Hypokalemia 08/06/2013  . GERD (gastroesophageal reflux disease) 08/06/2013  . Spinal stenosis of lumbar region 08/06/2013  . Bladder spasm 08/06/2013  . Obesity, Class III, BMI 40-49.9 (morbid obesity) ([place]) 02/03/2013  . Lipoma of buttock s/p excision 02/20/2013 02/03/2013  . DYSPNEA 01/23/2008  . OBSTRUCTIVE SLEEP APNEA 09/12/2007  . Essential hypertension 09/12/2007  . Allergic rhinitis 09/12/2007  . SLEEPINESS 09/12/2007  . ANGINA, HX OF 09/12/2007    Orientation RESPIRATION BLADDER Height & Weight     Self, Time, Situation, Place  Normal Continent Weight: 268 lb (121.6 kg) Height:  5\' 5"  (165.1 cm)  BEHAVIORAL SYMPTOMS/MOOD NEUROLOGICAL BOWEL NUTRITION STATUS   (None)  (None) Continent Diet (Full liquid)  AMBULATORY STATUS COMMUNICATION OF NEEDS Skin   Limited Assist Verbally Skin abrasions, Other (Comment), Surgical wounds (Skin tear. Pressure Injury Stage 2 left buttocks.)                       Personal Care Assistance Level of Assistance  Bathing, Feeding, Dressing Bathing Assistance: Maximum assistance Feeding assistance: Independent Dressing Assistance: Maximum assistance     Functional Limitations Info  Sight, Hearing, Speech Sight Info: Adequate Hearing Info: Adequate Speech Info: Adequate    SPECIAL CARE FACTORS FREQUENCY  PT (By licensed PT), Blood pressure, OT (By licensed OT)     PT Frequency: 5 x week OT Frequency:  5 x week            Contractures Contractures Info: Not present    Additional Factors Info  Code Status, Allergies, Psychotropic Code Status Info: DNR Allergies Info: Latex, Penicillins Psychotropic Info: Depression         Current  Medications (07/13/2016):  This is the current hospital active medication list Current Facility-Administered Medications  Medication Dose Route Frequency Provider Last Rate Last Dose  . acetaminophen (TYLENOL) tablet 650 mg  650 mg Oral Q6H PRN Lily Kocher, MD   650 mg at 07/10/16 2156   Or  . acetaminophen (TYLENOL) suppository 650 mg  650 mg Rectal Q6H PRN Lily Kocher, MD      . chlorhexidine (PERIDEX) 0.12 % solution 15 mL  15 mL Mouth Rinse BID Costin Karlyne Greenspan, MD   15 mL at 07/13/16 1017  . cloNIDine (CATAPRES) tablet 0.1 mg  0.1 mg Oral TID Jonetta Osgood, MD      . enoxaparin (LOVENOX) injection 60 mg  60 mg Subcutaneous Q24H Greer Pickerel, MD   60 mg at 07/13/16 1018  . famotidine (PEPCID) tablet 20 mg  20 mg Oral BID Jonetta Osgood, MD   20 mg at 07/13/16 1016  . feeding supplement (BOOST / RESOURCE BREEZE) liquid 1 Container  1 Container Oral BID BM Jonetta Osgood, MD   1 Container at 07/13/16 1043  . gabapentin (NEURONTIN) capsule 300 mg  300 mg Oral BID Jonetta Osgood, MD   300 mg at 07/13/16 1017  . hydrALAZINE (APRESOLINE) injection 10 mg  10 mg Intravenous Q6H PRN Reyne Dumas, MD   10 mg at 07/11/16 2145  . hydrALAZINE (APRESOLINE) tablet 25 mg  25 mg Oral TID Jonetta Osgood, MD      . insulin aspart (novoLOG) injection 0-9 Units  0-9 Units Subcutaneous Q4H Georganna Skeans, MD   1 Units at 07/12/16 1132  . levalbuterol (XOPENEX) nebulizer solution 1.25 mg  1.25 mg Nebulization Q8H PRN Reyne Dumas, MD      . loratadine (CLARITIN) tablet 10 mg  10 mg Oral Daily PRN Gardiner Barefoot, NP   10 mg at 07/10/16 0403  . magic mouthwash  5 mL Oral TID PRN Reyne Dumas, MD      . MEDLINE mouth rinse  15 mL Mouth Rinse q12n4p Costin Karlyne Greenspan, MD   15 mL at 07/12/16 1600  . meloxicam (MOBIC) tablet 15 mg  15 mg Oral Daily Shanker Kristeen Mans, MD      . menthol-cetylpyridinium (CEPACOL) lozenge 3 mg  1 lozenge Oral PRN Greer Pickerel, MD   3 mg at 07/11/16 2152  . metoprolol  (LOPRESSOR) injection 5 mg  5 mg Intravenous Q4H PRN Reyne Dumas, MD   5 mg at 07/11/16 2249  . metoprolol (LOPRESSOR) tablet 50 mg  50 mg Oral BID Jonetta Osgood, MD   50 mg at 07/13/16 1017  . morphine 2 MG/ML injection 2 mg  2 mg Intravenous Q2H PRN Reyne Dumas, MD   2 mg at 07/13/16 [id number]  . ondansetron (ZOFRAN) injection 4 mg  4 mg Intravenous Q6H PRN Reyne Dumas, MD   4 mg at 07/12/16 0345  . pantoprazole (PROTONIX) EC tablet 40 mg  40 mg Oral BID Jonetta Osgood, MD   40 mg at 07/13/16 1016  . PARoxetine (PAXIL) tablet 40 mg  40 mg Oral Daily Jonetta Osgood, MD   40 mg at 07/13/16 1016  . sodium chloride  flush (NS) 0.9 % injection 10-40 mL  10-40 mL Intracatheter PRN Reyne Dumas, MD   10 mL at 07/12/16 0450     Discharge Medications: Please see discharge summary for a list of discharge medications.  Relevant Imaging Results:  Relevant Lab Results:   Additional Information SS#: [id number]  Candie Chroman, LCSW

## 2016-07-13 NOTE — Progress Notes (Signed)
PT Cancellation Note  Patient Details Name: Amy [name] MRN: [id number] DOB: 10/03/1945   Cancelled Treatment:    Reason Eval/Treat Not Completed: Patient declined, no reason specified stating she wanted to take a bath prior to walking.  Pt educated on importance of ambulation. New PT order noted today- Initial PT evaluation was completed on 07/09/16. Will check back after pt has had bath.   SMITH,KAREN LUBECK 07/13/2016, 9:54 AM

## 2016-07-13 NOTE — Progress Notes (Signed)
Occupational Therapy Treatment Patient Details Name: Amy [name] MRN: [id number] DOB: 06/03/1946 Today's Date: 07/13/2016    History of present illness 70 y.o. female admitted to [facility] on 07/02/16 for abdominal pain, nausea and vomiting.  Pt found to have a SBO and underwent an exploratory laparotomy with post op wound vac on 07/06/16.  Pt with significant PMHx of DOE, PVD, neuropathy, HTN, h/o DVT, fibromyalgia, and chronic back pain (s/p multiple back surgeries).    OT comments  Focus of session on bathing and dressing. Pt continues to require min to total assist, but able to perform pericare in standing today. Improving endurance.   Follow Up Recommendations  SNF;Supervision/Assistance - 24 hour    Equipment Recommendations       Recommendations for Other Services      Precautions / Restrictions Precautions Precautions: Fall Precaution Comments: wound vac Restrictions Weight Bearing Restrictions: No       Mobility Bed Mobility Overal bed mobility: Needs Assistance Bed Mobility: Supine to Sit     Supine to sit: Supervision        Transfers Overall transfer level: Needs assistance Equipment used: Rolling walker (2 wheeled) Transfers: Sit to/from [facility] Sit to Stand: Min guard Stand pivot transfers: Min guard       General transfer comment: min guard for safety, increased time    Balance Overall balance assessment: Needs assistance Sitting-balance support: Feet supported Sitting balance-Leahy Scale: Good       Standing balance-Leahy Scale: Poor Standing balance comment: requires one hand on walker                   ADL Overall ADL's : Needs assistance/impaired     Grooming: Wash/dry hands;Wash/dry face;Sitting;Set up   Upper Body Bathing: Minimal assitance;Sitting   Lower Body Bathing: Total assistance;Sit to/from stand   Upper Body Dressing : Sitting;Minimal assistance   Lower Body Dressing: Total  assistance;Sit to/from stand       Toileting- Water quality scientist and Hygiene: Minimal assistance;Sit to/from stand                Vision                     Perception     Praxis      Cognition   Behavior During Therapy: [facility] for tasks assessed/performed Overall Cognitive Status: Within Functional Limits for tasks assessed                       Extremity/Trunk Assessment               Exercises     Shoulder Instructions       General Comments      Pertinent Vitals/ Pain       Pain Assessment: Faces Faces Pain Scale: Hurts little more Pain Location: abdomen Pain Descriptors / Indicators: Sore Pain Intervention(s): Monitored during session  Home Living                                          Prior Functioning/Environment              Frequency  Min 2X/week        Progress Toward Goals  OT Goals(current goals can now be found in the care plan section)  Progress towards OT goals: Progressing toward goals  Acute Rehab OT  Goals Time For Goal Achievement: 07/25/16 Potential to Achieve Goals: Good  Plan Discharge plan remains appropriate    Co-evaluation                 End of Session Equipment Utilized During Treatment: Rolling walker   Activity Tolerance Patient tolerated treatment well   Patient Left in chair;with call bell/phone within reach;with nursing/sitter in room   Nurse Communication Mobility status        Time: 1001-1045 OT Time Calculation (min): 44 min  Charges: OT General Charges $OT Visit: 1 Procedure OT Treatments $Self Care/Home Management : 38-52 mins  Malka So 07/13/2016, 10:56 AM

## 2016-07-13 NOTE — Discharge Instructions (Signed)
[facility]      [facility] [phone]  OPEN ABDOMINAL SURGERY: POST OP INSTRUCTIONS  Always review your discharge instruction sheet given to you by the facility where your surgery was performed.  IF YOU HAVE DISABILITY OR FAMILY LEAVE FORMS, YOU MUST BRING THEM TO THE OFFICE FOR PROCESSING.  PLEASE DO NOT GIVE THEM TO YOUR DOCTOR.  1. A prescription for pain medication may be given to you upon discharge.  Take your pain medication as prescribed, if needed.  If narcotic pain medicine is not needed, then you may take acetaminophen (Tylenol) or ibuprofen (Advil) as needed. 2. Take your usually prescribed medications unless otherwise directed. 3. If you need a refill on your pain medication, please contact your pharmacy. They will contact our office to request authorization.  Prescriptions will not be filled after 5pm or on week-ends. 4. You should follow a light diet the first few days after arrival home, such as soup and crackers, pudding, etc.unless your doctor has advised otherwise. A high-fiber, low fat diet can be resumed as tolerated.   Be sure to include lots of fluids daily. Most patients will experience some swelling and bruising on the chest and neck area.  Ice packs will help.  Swelling and bruising can take several days to resolve 5. Most patients will experience some swelling and bruising in the area of the incision. Ice pack will help. Swelling and bruising can take several days to resolve..  6. It is common to experience some constipation if taking pain medication after surgery.  Increasing fluid intake and taking a stool softener will usually help or prevent this problem from occurring.  A mild laxative (Milk of Magnesia or Miralax) should be taken according to package directions if there are no bowel movements after 48 hours. 7.  You may have steri-strips (small skin tapes) in place directly over the incision.  These strips should be left on the skin for 7-10 days.  If your  surgeon used skin glue on the incision, you may shower in 24 hours.  The glue will flake off over the next 2-3 weeks.  Any sutures or staples will be removed at the office during your follow-up visit. You may find that a light gauze bandage over your incision may keep your staples from being rubbed or pulled. You may shower and replace the bandage daily. 8. ACTIVITIES:  You may resume regular (light) daily activities beginning the next day--such as daily self-care, walking, climbing stairs--gradually increasing activities as tolerated.  You may have sexual intercourse when it is comfortable.  Refrain from any heavy lifting or straining until approved by your doctor. a. You may drive when you no longer are taking prescription pain medication, you can comfortably wear a seatbelt, and you can safely maneuver your car and apply brakes b. Return to Work: ___________________________________ 9. You should see your doctor in the office for a follow-up appointment approximately two weeks after your surgery.  Make sure that you call for this appointment within a day or two after you arrive home to insure a convenient appointment time. OTHER INSTRUCTIONS:  _____________________________________________________________ _____________________________________________________________  WHEN TO CALL YOUR DOCTOR: 1. Fever over 101.0 2. Inability to urinate 3. Nausea and/or vomiting 4. Extreme swelling or bruising 5. Continued bleeding from incision. 6. Increased pain, redness, or drainage from the incision. 7. Difficulty swallowing or breathing 8. Muscle cramping or spasms. 9. Numbness or tingling in hands or feet or around lips.  The clinic staff is available to   answer your questions during regular business hours.  Please don't hesitate to call and ask to speak to one of the nurses if you have concerns.  For further questions, please visit [email or web address]   

## 2016-07-13 NOTE — Clinical Social Work Placement (Signed)
   CLINICAL SOCIAL WORK PLACEMENT  NOTE  Date:  07/13/2016  Patient Details  Name: Amy [name] MRN: [id number] Date of Birth: 08-17-1946  Clinical Social Work is seeking post-discharge placement for this patient at the [place] level of care (*CSW will initial, date and re-position this form in  chart as items are completed):  Yes   Patient/family provided with [place] Work Department's list of facilities offering this level of care within the geographic area requested by the patient (or if unable, by the patient's family).  Yes   Patient/family informed of their freedom to choose among providers that offer the needed level of care, that participate in Medicare, Medicaid or managed care program needed by the patient, have an available bed and are willing to accept the patient.  Yes   Patient/family informed of [facility]'s ownership interest in [facility] and [facility], as well as of the fact that they are under no obligation to receive care at these facilities.  PASRR submitted to EDS on 07/13/16     PASRR number received on       Existing PASRR number confirmed on 07/13/16     FL2 transmitted to all facilities in geographic area requested by pt/family on 07/13/16     FL2 transmitted to all facilities within larger geographic area on       Patient informed that his/her managed care company has contracts with or will negotiate with certain facilities, including the following:            Patient/family informed of bed offers received.  Patient chooses bed at       Physician recommends and patient chooses bed at      Patient to be transferred to   on  .  Patient to be transferred to facility by       Patient family notified on   of transfer.  Name of family member notified:        PHYSICIAN Please sign FL2     Additional Comment:    _______________________________________________ Candie Chroman,  LCSW 07/13/2016, 12:49 PM

## 2016-07-13 NOTE — Clinical Social Work Note (Signed)
Patient's sister returned CSW's call. Discussed how insurance covers SNF. Patient's sister has questions regarding home health. CSW called and left voicemail for RNCM to call her.  Dayton Scrape, [place]

## 2016-07-13 NOTE — Clinical Social Work Note (Signed)
Clinical Social Work Assessment  Patient Details  Name: Amy [name] MRN: [id number] Date of Birth: 09-May-1946  Date of referral:  07/13/16               Reason for consult:  Facility Placement, Discharge Planning                Permission sought to share information with:  Facility Sport and exercise psychologist, Family Supports Permission granted to share information::  Yes, Verbal Permission Granted  Name::     Music therapist::  SNF's  Relationship::  Sister  Contact Information:  [phone]  Housing/Transportation Living arrangements for the past 2 months:  Single Family Home Source of Information:  Patient, Medical Team Patient Interpreter Needed:  None Criminal Activity/Legal Involvement Pertinent to Current Situation/Hospitalization:  No - Comment as needed Significant Relationships:  Adult Children, Spouse, Siblings Lives with:  Adult Children, Spouse Do you feel safe going back to the place where you live?  Yes Need for family participation in patient care:  Yes (Comment)  Care giving concerns:  PT recommending SNF once medically stable for discharge.   Social Worker assessment / plan:  CSW met with patient. No supports at bedside. CSW introduced role and explained that PT recommendations would be discussed. At this time, patient is agreeable to SNF but prefers HHPT. RNCM notified. Patient gave permission to fax information to SNF's in her area as she wants to make the decision on the day of discharge. Patient asked that CSW call her sister, Amy [name], that lives in [place] to discuss recommendations [phone]). No further concerns. CSW encouraged patient to contact CSW as needed. CSW will continue to follow patient for support and facilitate discharge to SNF, if agreeable, once medically stable.  Employment status:  Retired Forensic scientist:  Medicare PT Recommendations:  [place] / Referral to community resources:  [place]  Patient/Family's Response to care:  Patient somewhat agreeable to SNF. She is deciding between SNF and HHPT. Patient's family supportive and involved in patient's care. Patient appreciated social work intervention.  Patient/Family's Understanding of and Emotional Response to Diagnosis, Current Treatment, and Prognosis:  Patient understands the need for rehab at this time. Patient appears happy with hospital care.  Emotional Assessment Appearance:  Appears stated age Attitude/Demeanor/Rapport:  Other (Pleasant) Affect (typically observed):  Accepting, Appropriate, Calm, Pleasant Orientation:  Oriented to Self, Oriented to Place, Oriented to  Time, Oriented to Situation Alcohol / Substance use:  Never Used Psych involvement (Current and /or in the community):  No (Comment)  Discharge Needs  Concerns to be addressed:  Care Coordination Readmission within the last 30 days:  No Current discharge risk:  Dependent with Mobility Barriers to Discharge:  No Barriers Identified   Candie Chroman, LCSW 07/13/2016, 12:46 PM

## 2016-07-13 NOTE — Progress Notes (Signed)
Physical Therapy Treatment Patient Details Name: Amy [name] MRN: [id number] DOB: [birth year] Today's Date: 07/13/2016    History of Present Illness 69 y.o. female admitted to [facility] on 07/02/16 for abdominal pain, nausea and vomiting.  Pt found to have a SBO and underwent an exploratory laparotomy with post op wound vac on 07/06/16.  Pt with significant PMHx of DOE, PVD, neuropathy, HTN, h/o DVT, fibromyalgia, and chronic back pain (s/p multiple back surgeries).     PT Comments    Pt had wound vac removed prior to session and nursing A with 2 IV poles today.  Pt able to ambulate slowly with min/guard for 3' with increasing pain as gait progressed. Con't to recommend SNF for rehab.  Follow Up Recommendations  SNF;Supervision/Assistance - 24 hour     Equipment Recommendations  None recommended by PT    Recommendations for Other Services       Precautions / Restrictions Precautions Precautions: Fall Precaution Comments: wound vac Restrictions Weight Bearing Restrictions: No    Mobility  Bed Mobility Overal bed mobility: Needs Assistance Bed Mobility: Supine to Sit     Supine to sit: Supervision     General bed mobility comments: Pt  up in recliner upon arrival  Transfers Overall transfer level: Needs assistance Equipment used: Rolling walker (2 wheeled) Transfers: Sit to/from Stand Sit to Stand: Min assist Stand pivot transfers: Min guard       General transfer comment: MIN A to power up from recliner  Ambulation/Gait Ambulation/Gait assistance: Min guard Ambulation Distance (Feet): 60 Feet Assistive device: Rolling walker (2 wheeled) Gait Pattern/deviations: Step-through pattern;Decreased stride length;Trunk flexed Gait velocity: significantly decreased Gait velocity interpretation: Below normal speed for age/gender General Gait Details: cues for posture.  Heavy reliance on UE and she reports increasing pain in lateral abdomen as gait progressed  .Very slow cadence   Stairs            Wheelchair Mobility    Modified Rankin (Stroke Patients Only)       Balance Overall balance assessment: Needs assistance Sitting-balance support: Feet supported Sitting balance-Leahy Scale: Good       Standing balance-Leahy Scale: Poor Standing balance comment: heavy reliance on RW as pain increased                    Cognition Arousal/Alertness: Awake/alert Behavior During Therapy: WFL for tasks assessed/performed Overall Cognitive Status: Within Functional Limits for tasks assessed                      Exercises      General Comments        Pertinent Vitals/Pain Pain Assessment: 0-10 Pain Score: 7  Faces Pain Scale: Hurts little more Pain Location: abd Pain Descriptors / Indicators: Sore Pain Intervention(s): Monitored during session;Patient requesting pain meds-RN notified    Home Living                      Prior Function            PT Goals (current goals can now be found in the care plan section) Acute Rehab PT Goals Patient Stated Goal: pt wants to decrease pain PT Goal Formulation: With patient Time For Goal Achievement: 07/23/16 Potential to Achieve Goals: Good Progress towards PT goals: Progressing toward goals    Frequency    Min 3X/week      PT Plan Current plan remains appropriate    Co-evaluation  End of Session Equipment Utilized During Treatment: Gait belt Activity Tolerance: Patient limited by pain Patient left: in chair;with call bell/phone within reach     Time: [id number] PT Time Calculation (min) (ACUTE ONLY): 25 min  Charges:  $Gait Training: 23-37 mins                    G Codes:      SMITH,KAREN LUBECK 07/13/2016, 11:56 AM

## 2016-07-13 NOTE — Progress Notes (Signed)
Patient ID: Amy [name], female   DOB: 01/20/1946, 70 y.o.   MRN: [id number]  [facility] Surgery Progress Note  7 Days Post-Op  Subjective: Still sore but doing better. Worked with PT this morning. Tolerating clear liquids. Had BM this morning.  Objective: Vital signs in last 24 hours: Temp:  [98.3 F (36.8 C)-99 F (37.2 C)] 98.3 F (36.8 C) (10/20 0937) Pulse Rate:  [86-99] 86 (10/20 0937) Resp:  [17-22] 17 (10/20 0937) BP: (141-175)/(86-98) 141/98 (10/20 0937) SpO2:  [96 %-97 %] 96 % (10/20 0937) Weight:  [268 lb (121.6 kg)] 268 lb (121.6 kg) (10/19 2020) Last BM Date: 07/11/16  Intake/Output from previous day: 10/19 0701 - 10/20 0700 In: 2442.3 [P.O.:120; I.V.:2172.3; IV Piggyback:150] Out: 0  Intake/Output this shift: Total I/O In: 120 [P.O.:120] Out: 0   PE: Gen: Alert, NAD, pleasant Pulm: Effort normal Abd: Soft, distended, +BS, midline abdominal incision and left lateral incisions cdi with staples intact and no erythema or drainage  Lab Results:   Recent Labs  07/10/16 1407 07/11/16 0433  WBC 12.2* 11.5*  HGB 12.5 11.2*  HCT 37.0 33.5*  PLT 291 275   BMET  Recent Labs  07/12/16 0450 07/13/16 0500  NA 141 139  K 3.4* 3.6  CL 107 105  CO2 28 29  GLUCOSE 132* 107*  BUN 13 12  CREATININE 0.55 0.57  CALCIUM 8.1* 8.0*   PT/INR No results for input(s): LABPROT, INR in the last 72 hours. CMP     Component Value Date/Time   NA 139 07/13/2016 0500   NA 140 02/14/2016   K 3.6 07/13/2016 0500   CL 105 07/13/2016 0500   CO2 29 07/13/2016 0500   GLUCOSE 107 (H) 07/13/2016 0500   BUN 12 07/13/2016 0500   BUN 13 02/14/2016   CREATININE 0.57 07/13/2016 0500   CALCIUM 8.0 (L) 07/13/2016 0500   PROT 6.0 (L) 07/12/2016 0450   ALBUMIN 2.1 (L) 07/12/2016 0450   AST 28 07/12/2016 0450   ALT 33 07/12/2016 0450   ALKPHOS 89 07/12/2016 0450   BILITOT 0.4 07/12/2016 0450   GFRNONAA >60 07/13/2016 0500   GFRAA >60 07/13/2016 0500    Lipase     Component Value Date/Time   LIPASE 61 (H) 07/04/2016 0318       Studies/Results: No results found.  Anti-infectives: Anti-infectives    Start     Dose/Rate Route Frequency Ordered Stop   07/06/16 1600  ciprofloxacin (CIPRO) IVPB 400 mg     400 mg 200 mL/hr over 60 Minutes Intravenous To [facility] 07/06/16 0956 07/06/16 1254       Assessment/Plan Diagnostic laparoscopy converted to open exploratory laparotomy, Lysis of adhesions for 3 hours - 07/06/2016 [facility] - POD 7 - Prevena wound vac removed, wound c/d/i with staples intact. Keep staples until POD10-14 - tolerating clears, abdominal pain slowly improving - ambulated with PT this morning  Hypokalemia - 3.6, resolved HTN GERD Chronic pain  VTE - lovenox FEN - full liquids, IVF  Plan - wean TPN .  Advance to full liquids.   Continue to encourage mobilization, PT.   Wound vac removed and incision healing well; recommend daily dry dressing changes; staples to be removed POD10-14.   LOS: 11 days    Amy [name] , [facility] Surgery 07/13/2016, 11:44 AM Pager: [phone] Consults: [phone] Mon-Fri 7:00 am-4:30 pm Sat-Sun 7:00 am-11:30 am  Agree with above. She continues to get better, despite total lack of activity.  Alphonsa Overall, MD, [facility] Surgery Pager: [phone] Office phone:  [phone]

## 2016-07-13 NOTE — Progress Notes (Signed)
PROGRESS NOTE        PATIENT DETAILS Name: Amy [name] Age: 70 y.o. Sex: female Date of Birth: 1945-10-19 Admit Date: 07/02/2016 Admitting Physician Lily Kocher, MD [id number], [facility], MD  Brief Narrative: Patient is a 70 y.o. female with past medical history of hypertension, GERD, DVT (not on anticoagulation), chronic pain syndrome who presented to ED with small bowel obstruction. She was initially managed with conservative measures, but subsequently underwent exploratory laparotomy and lysis of adhesions on 10/13.  Subjective: Small bowel movement earlier this morning.  Tolerating clear liquids  No chest pain No SOB  Assessment/Plan: Principal Problem: SBO (small bowel obstruction): Initially conservative measures attempted, however required exploratory laparotomy with lysis of adhesions. Postoperatively, placed on TNA. Her bowel function seems to becoming back, NG tube has now been discontinued by general surgery, tolerating clear liquids-advanced to full liquids. Suspect TNA can be discontinued. Postoperative care including wound care deferred to general surgery at this point.   Active Problems: Hypokalemia: Repleted. Suspect secondary to GI loss from NG tube.  Sinus tachycardia: Improving. Felt to be secondary to sympathetic response from anxiety/NG tube placement, TSH within normal limits, CT angiogram of the chest negative for pulmonary embolism. 2-D echocardiogram with preserved ejection fraction.  Hypertension: Better controlled-change from transdermal to oral clonidine, continue metoprolol-add home dose of hydralazine. We will continue to follow and adjust accordingly   GERD: Continue PPI.  Elevated lipase levels: Probably secondary to bowel obstruction rather than pancreatitis. CT scan of the abdomen on 10/9 and 10/12  did not show any obvious pancreatitis  Allergic rhinitis: Continue Flonase  Anxiety/depression: Resume Celexa,  hopefully with clinical improvement in her anxiety levels will abate.  History of neuropathy/chronic pain syndrome: Resume gabapentin.  DVT Prophylaxis: Prophylactic Lovenox   Code Status:  DNR  Family Communication: None at bedside  Disposition Plan: Remain inpatient-but will plan on Home health vs SNF on discharge  Antimicrobial agents: None  Procedures: LAPAROSCOPY DIAGNOSTIC EMERGENT TO OPEN EXPLORATORY LAPAROTOMY, LYSIS OF ADHESIONS - 07/06/2016  CONSULTS:  general surgery  Time spent: 25- minutes-Greater than 50% of this time was spent in counseling, explanation of diagnosis, planning of further management, and coordination of care.  MEDICATIONS: Anti-infectives    Start     Dose/Rate Route Frequency Ordered Stop   07/06/16 1600  ciprofloxacin (CIPRO) IVPB 400 mg     400 mg 200 mL/hr over 60 Minutes Intravenous To [facility] 07/06/16 0956 07/06/16 1254      Scheduled Meds: . chlorhexidine  15 mL Mouth Rinse BID  . cloNIDine  0.1 mg Transdermal Q Tue  . enoxaparin (LOVENOX) injection  60 mg Subcutaneous Q24H  . famotidine  20 mg Oral BID  . feeding supplement  1 Container Oral BID BM  . gabapentin  300 mg Oral BID  . insulin aspart  0-9 Units Subcutaneous Q4H  . mouth rinse  15 mL Mouth Rinse q12n4p  . meloxicam  15 mg Oral Daily  . metoprolol tartrate  50 mg Oral BID  . pantoprazole  40 mg Oral BID  . PARoxetine  40 mg Oral Daily   Continuous Infusions: . 0.9 % NaCl with KCl 40 mEq / L 50 mL/hr (07/12/16 1238)  . Marland KitchenTPN (CLINIMIX-E) Adult 40 mL/hr at 07/12/16 1715   And  . fat emulsion 168 mL (07/12/16 1715)   PRN Meds:.acetaminophen **OR**  acetaminophen, hydrALAZINE, levalbuterol, loratadine, magic mouthwash, menthol-cetylpyridinium, metoprolol, morphine injection, ondansetron (ZOFRAN) IV, sodium chloride flush   PHYSICAL EXAM: Vital signs: Vitals:   07/12/16 1849 07/12/16 2020 07/13/16 0527 07/13/16 0937  BP: (!) 173/92 (!) 164/86 (!)  175/92 (!) 141/98  Pulse: 99 99 90 86  Resp: 17 (!) 22 18 17   Temp: 98.6 F (37 C) 99 F (37.2 C) 98.7 F (37.1 C) 98.3 F (36.8 C)  TempSrc: Oral   Oral  SpO2: 96% 97% 96% 96%  Weight:  121.6 kg (268 lb)    Height:       Filed Weights   07/11/16 1101 07/11/16 2113 07/12/16 2020  Weight: 124.7 kg (275 lb) 121.1 kg (267 lb) 121.6 kg (268 lb)   Body mass index is 44.6 kg/m.   General appearance :Awake, alert, not in any distress. Speech Clear.  Eyes:, pupils equally reactive to light and accomodation,no scleral icterus.Pink conjunctiva HEENT: Atraumatic and Normocephalic Neck: supple, no JVD. No cervical lymphadenopathy. No thyromegaly Resp:Good air entry bilaterally, no added sounds  CVS: S1 S2 regular, no murmurs.  GI: Bowel sounds Sluggish, appropriately tender. Not distended. Extremities: B/L Lower Ext shows no edema, both legs are warm to touch Neurology:  speech clear,Non focal, sensation is grossly intact. Psychiatric: Normal judgment and insight. Alert and oriented x 3. Normal mood. Musculoskeletal:gait appears to be normal.No digital cyanosis Skin:No Rash, warm and dry Wounds:N/A  I have personally reviewed following labs and imaging studies  LABORATORY DATA: CBC:  Recent Labs Lab 07/07/16 0712 07/08/16 0554 07/10/16 1407 07/11/16 0433  WBC 7.2 11.3* 12.2* 11.5*  NEUTROABS  --   --   --  9.0*  HGB 14.5 13.1 12.5 11.2*  HCT 44.5 38.5 37.0 33.5*  MCV 91.4 90.4 89.6 89.8  PLT 279 232 291 [id number]    Basic Metabolic Panel:  Recent Labs Lab 07/10/16 1407 07/11/16 0433 07/11/16 2000 07/12/16 0450 07/13/16 0500  NA 144 141 142 141 139  K 3.3* 2.6* 3.1* 3.4* 3.6  CL 108 105 109 107 105  CO2 27 25 27 28 29   GLUCOSE 105* 91 106* 132* 107*  BUN 11 13 14 13 12   CREATININE 0.73 0.77 0.61 0.55 0.57  CALCIUM 8.5* 8.4* 8.3* 8.1* 8.0*  MG  --  1.9  --  2.0 2.0  PHOS  --  4.0  --  2.6 2.9    GFR: Estimated Creatinine Clearance: 85.5 mL/min (by C-G formula  based on SCr of 0.57 mg/dL).  Liver Function Tests:  Recent Labs Lab 07/07/16 0712 07/10/16 1407 07/11/16 0433 07/12/16 0450  AST 25 34 38 28  ALT 32 32 37 33  ALKPHOS 62 88 94 89  BILITOT 1.3* 0.7 0.6 0.4  PROT 5.9* 6.8 6.5 6.0*  ALBUMIN 2.2* 2.3* 2.1* 2.1*   No results for input(s): LIPASE, AMYLASE in the last 168 hours. No results for input(s): AMMONIA in the last 168 hours.  Coagulation Profile: No results for input(s): INR, PROTIME in the last 168 hours.  Cardiac Enzymes:  Recent Labs Lab 07/07/16 1832 07/10/16 1407 07/10/16 1907 07/10/16 2231 07/11/16 0433  TROPONINI 0.03* <0.03 <0.03 0.09* 0.04*    BNP (last 3 results) No results for input(s): PROBNP in the last 8760 hours.  HbA1C: No results for input(s): HGBA1C in the last 72 hours.  CBG:  Recent Labs Lab 07/12/16 1651 07/12/16 2018 07/13/16 0030 07/13/16 0420 07/13/16 0800  GLUCAP 118* 114* 115* 113* 99    Lipid Profile:  Recent  Labs  07/11/16 0433  TRIG 101    Thyroid Function Tests: No results for input(s): TSH, T4TOTAL, FREET4, T3FREE, THYROIDAB in the last 72 hours.  Anemia Panel: No results for input(s): VITAMINB12, FOLATE, FERRITIN, TIBC, IRON, RETICCTPCT in the last 72 hours.  Urine analysis:    Component Value Date/Time   COLORURINE AMBER (A) 07/03/2016 0721   APPEARANCEUR CLEAR 07/03/2016 0721   LABSPEC >1.046 (H) 07/03/2016 0721   PHURINE 6.0 07/03/2016 0721   GLUCOSEU NEGATIVE 07/03/2016 0721   HGBUR NEGATIVE 07/03/2016 0721   BILIRUBINUR SMALL (A) 07/03/2016 0721   KETONESUR NEGATIVE 07/03/2016 0721   PROTEINUR 100 (A) 07/03/2016 0721   UROBILINOGEN 1.0 04/21/2014 0756   NITRITE NEGATIVE 07/03/2016 0721   LEUKOCYTESUR NEGATIVE 07/03/2016 0721    Sepsis Labs: Lactic Acid, Venous No results found for: LATICACIDVEN  MICROBIOLOGY: Recent Results (from the past 240 hour(s))  MRSA PCR Screening     Status: None   Collection Time: 07/07/16  6:44 PM  Result  Value Ref Range Status   MRSA by PCR NEGATIVE NEGATIVE Final    Comment:        The GeneXpert MRSA Assay (FDA approved for NASAL specimens only), is one component of a comprehensive MRSA colonization surveillance program. It is not intended to diagnose MRSA infection nor to guide or monitor treatment for MRSA infections.     RADIOLOGY STUDIES/RESULTS: Dg Chest 2 View  Result Date: 07/02/2016 CLINICAL DATA:  Mid lower chest and left sided chest pain up into left shoulder. Severe abd pain and tightness. Dizziness. N/V. SOB. Rapid heartbeat. Skin feels cold - almost clammy, but she states she is burning up. Symptoms have increased in severity and frequency over the last week. EXAM: CHEST  2 VIEW COMPARISON:  01/05/2016 FINDINGS: Lung volumes are low. There is linear/streaky type opacity at the lung bases there is most likely atelectasis. Infection is possible. Remainder of the lungs is clear. No pleural effusion or pneumothorax. Cardiac silhouette is normal in size. No mediastinal or hilar masses. No evidence of adenopathy. Skeletal structures are intact. IMPRESSION: 1. Lung base opacity accentuated by low lung volumes. This is most likely atelectasis. Consider pneumonia if there are consistent clinical symptoms. 2. No other evidence of acute cardiopulmonary disease. Electronically Signed   By: Lajean Manes M.D.   On: 07/02/2016 16:38   Dg Cervical Spine Complete  Result Date: 06/28/2016 CLINICAL DATA:  Chronic neck pain, no known injury, initial encounter EXAM: CERVICAL SPINE - COMPLETE 4+ VIEW COMPARISON:  None. FINDINGS: Seven cervical segments are well visualized. Vertebral body height is well maintained. No prevertebral soft tissue changes are seen. Mild facet hypertrophic changes are noted. No significant neural foraminal narrowing is seen. The odontoid is within normal limits. IMPRESSION: Mild degenerative change without acute abnormality. Electronically Signed   By: Inez Catalina M.D.    On: 06/28/2016 08:38   Dg Lumbar Spine Complete W/bend  Result Date: 06/28/2016 CLINICAL DATA:  Low back pain, no known injury, initial encounter EXAM: LUMBAR SPINE - COMPLETE WITH BENDING VIEWS COMPARISON:  05/22/2016 FINDINGS: Multilevel postoperative changes are noted throughout the lumbar spine. The overall appearance of the hardware is stable. Vertebral body height is well maintained. Aortic calcifications are seen. Flexion and extension views show no definitive instability. IMPRESSION: Multilevel postoperative changes. No instability is seen on flexion and extension. No acute abnormality is noted. Electronically Signed   By: Inez Catalina M.D.   On: 06/28/2016 08:40   Dg Si Joints  Result  Date: 06/28/2016 CLINICAL DATA:  Chronic pain.  No reported injury . EXAM: BILATERAL SACROILIAC JOINTS - 3+ VIEW COMPARISON:  CT 06/01/2016.  MRI 02/08/2016 FINDINGS: Lumbosacral spine fusion. Degenerative changes lumbar spine and both SI joints. Degenerative changes both hips. Osteitis pubis again noted. No acute abnormality identified. Pelvic calcifications consistent phleboliths noted. IMPRESSION: Lumbosacral spine fusion. Degenerative changes lumbar spine, both SI joints, both hips. Osteitis pubis again noted. No acute abnormality identified. Electronically Signed   By: Marcello Moores  Register   On: 06/28/2016 06:53   Dg Shoulder Right  Result Date: 06/28/2016 CLINICAL DATA:  Chronic pain.  No recent prior. EXAM: RIGHT SHOULDER - 2+ VIEW COMPARISON:  01/05/2016. FINDINGS: Acromioclavicular glenohumeral degenerative change. Subacromial spurring is noted. No evidence of fracture or dislocation. IMPRESSION: 1.  No acute abnormality. 2. Degenerative changes right shoulder with subacromial spurring. Electronically Signed   By: Marcello Moores  Register   On: 06/28/2016 06:47   Dg Knee 1-2 Views Left  Result Date: 06/28/2016 CLINICAL DATA:  Knee pain common no known injury, initial encounter EXAM: LEFT KNEE - 1-2 VIEW  COMPARISON:  None. FINDINGS: No acute fracture or dislocation is noted. No joint effusion is seen. Very minimal medial joint space narrowing is noted. IMPRESSION: Minimal degenerative change without acute abnormality. Electronically Signed   By: Inez Catalina M.D.   On: 06/28/2016 08:38   Dg Knee 1-2 Views Right  Result Date: 06/28/2016 CLINICAL DATA:  Right knee pain, no known injury, initial encounter EXAM: RIGHT KNEE - 1-2 VIEW COMPARISON:  None. FINDINGS: No evidence of fracture, dislocation, or joint effusion. Mild medial joint space narrowing is noted. No soft tissue abnormality is seen. IMPRESSION: Minimal degenerative changes. Electronically Signed   By: Inez Catalina M.D.   On: 06/28/2016 08:39   Ct Angio Chest Pe W Or Wo Contrast  Result Date: 07/07/2016 CLINICAL DATA:  Exploratory laparotomy yesterday. Shortness of breath. Evaluate for pulmonary embolus. EXAM: CT ANGIOGRAPHY CHEST WITH CONTRAST TECHNIQUE: Multidetector CT imaging of the chest was performed using the standard protocol during bolus administration of intravenous contrast. Multiplanar CT image reconstructions and MIPs were obtained to evaluate the vascular anatomy. CONTRAST:  100 cc Isovue 370 COMPARISON:  None. FINDINGS: Cardiovascular: Heart size upper normal. No pericardial effusion. No thoracic aortic aneurysm. Assessment of segmental and subsegmental pulmonary arteries to the lower lobes is degraded by respiratory motion, but within this limitation there is no CT evidence for an acute pulmonary embolus. Mediastinum/Nodes: No mediastinal lymphadenopathy. There is no hilar lymphadenopathy. There is no axillary lymphadenopathy. NG tube passes into the stomach with nonvisualization of the distal tip. Lungs/Pleura: Bilateral lower lobe subsegmental atelectasis noted. Focal bandlike opacity in the lingula likely also atelectatic. 7 mm ground-glass nodule identified right lower lobe (image 59 series 5). No evidence for pneumothorax. No  pleural effusion. Upper Abdomen: Stomach is fluid distended despite the presence of an NG tube. Musculoskeletal: Bone windows reveal no worrisome lytic or sclerotic osseous lesions. Review of the MIP images confirms the above findings. IMPRESSION: 1. No CT evidence for acute pulmonary embolus. 2. Bilateral lower lobe subsegmental atelectasis with probable lingular atelectasis associated. 3. Stomach is fluid distended despite the presence of an NG tube. Electronically Signed   By: Misty Stanley M.D.   On: 07/07/2016 14:31   Ct Abdomen Pelvis W Contrast  Result Date: 07/05/2016 CLINICAL DATA:  70 year old female with recently diagnosed small bowel obstruction and pancreatitis. Continued abdominal pelvic pain with nausea. EXAM: CT ABDOMEN AND PELVIS WITH CONTRAST TECHNIQUE: Multidetector  CT imaging of the abdomen and pelvis was performed using the standard protocol following bolus administration of intravenous contrast. CONTRAST:  100 cc intravenous Isovue 300 COMPARISON:  07/02/2016 and prior CTs. FINDINGS: Lower chest: Increased moderate bibasilar atelectasis identified. Hepatobiliary: The liver is unremarkable. The patient is status post cholecystectomy. There is no evidence of biliary dilatation. Pancreas: Unremarkable Spleen: Unremarkable Adrenals/Urinary Tract: The kidneys, adrenal glands and bladder are unremarkable except for unchanged 1.7 cm left adrenal adenoma. Stomach/Bowel: An NG tube is present with tip in the mid stomach. Dilated proximal-mid small bowel loops are again identified with collapsed distal small bowel loops, compatible with small bowel obstruction. There has been interval decreased caliber of stomach and dilated small bowel loops since the prior study. There is no evidence of definite bowel wall thickening or pneumoperitoneum. Vascular/Lymphatic: Abdominal aortic atherosclerotic calcifications noted without aneurysm. No enlarged lymph nodes identified. Reproductive: Patient is status  post hysterectomy. No adnexal masses identified. Other: No free fluid or focal collection/abscess. Musculoskeletal: Surgical changes within the lumbar spine again identified. No acute or suspicious abnormality. IMPRESSION: Small bowel obstruction again noted, with decompression of stomach and decreased caliber of dilated small bowel loops from NG tube within the stomach. No evidence of abscess, free fluid or pneumoperitoneum. Increased moderate bibasilar atelectasis. Abdominal aortic atherosclerosis. Electronically Signed   By: Margarette Canada M.D.   On: 07/05/2016 13:26   Ct Abdomen Pelvis W Contrast  Result Date: 07/02/2016 CLINICAL DATA:  Epigastric abdominal pain for 5 days. Vomiting starting last night. Prior hysterectomy. Prior abdominal adhesion removal. EXAM: CT ABDOMEN AND PELVIS WITH CONTRAST TECHNIQUE: Multidetector CT imaging of the abdomen and pelvis was performed using the standard protocol following bolus administration of intravenous contrast. CONTRAST:  151mL ISOVUE-300 IOPAMIDOL (ISOVUE-300) INJECTION 61% COMPARISON:  01/30/2016 FINDINGS: Lower chest: Bibasilar atelectasis. Normal heart size without pericardial or pleural effusion. Fluid in the distal esophagus. Hepatobiliary: Normal liver. Cholecystectomy, without biliary ductal dilatation. Pancreas: Normal, without mass or ductal dilatation. Spleen: Normal in size, without focal abnormality. Adrenals/Urinary Tract: Normal right adrenal gland. Left adrenal nodule measures 1.7 cm and is been similar in back to 06/23/2015, consistent with an adenoma on that study. Normal kidneys, without hydronephrosis. Normal urinary bladder. Stomach/Bowel: The stomach is distended and fluid-filled. The colon is normal to minimally decompressed distally. Decompressed terminal and distal ileum. Proximal and mid small bowel loops are dilated, including at 3.7 cm on image 56/series 2. Relatively gradual transition occurs, including within left paracentral anteriorly  positioned small bowel loops on image 52/series 2. No obstructive mass. No findings to suggest complicating ischemia. Minimal nonspecific mesenteric interloop edema is seen including on image 56/series 2. Vascular/Lymphatic: Advanced aortic and branch vessel atherosclerosis. No abdominopelvic adenopathy. Reproductive: Hysterectomy.  No adnexal mass. Other: No significant free fluid. No pneumatosis or free intraperitoneal air. Musculoskeletal: Lumbosacral spine fixation. IMPRESSION: 1. Mid small bowel obstruction, likely secondary to adhesions. No complicating ischemia. 2. Distended, fluid-filled stomach with fluid in the esophagus. The patient would likely benefit from nasogastric tube placement. 3. Advanced aortic atherosclerosis. 4. Left adrenal adenoma. Electronically Signed   By: Abigail Miyamoto M.D.   On: 07/02/2016 20:44   Dg Chest Port 1 View  Result Date: 07/10/2016 CLINICAL DATA:  Shortness of breath, hypertension, asthma EXAM: PORTABLE CHEST 1 VIEW COMPARISON:  07/07/2016 FINDINGS: Cardiomediastinal silhouette is stable. Mild basilar atelectasis. No pulmonary edema. NG tube in place. Linear atelectasis or infiltrate in lingula. IMPRESSION: No pulmonary edema. Mild basilar atelectasis. Linear atelectasis or infiltrate in lingula.  Electronically Signed   By: Lahoma Crocker M.D.   On: 07/10/2016 11:07   Dg Chest Port 1 View  Result Date: 07/05/2016 CLINICAL DATA:  Chest pain EXAM: PORTABLE CHEST 1 VIEW COMPARISON:  07/02/2016 FINDINGS: Cardiomediastinal silhouette is stable. Stable atelectasis or scarring right base. There is streaky atelectasis or infiltrate left base. No pulmonary edema. NG tube poorly visualized due to patient's large body habitus. IMPRESSION: No pulmonary edema. Right base atelectasis or scarring. Streaky left basilar atelectasis or infiltrate. Electronically Signed   By: Lahoma Crocker M.D.   On: 07/05/2016 12:28   Dg Shoulder Left  Result Date: 06/28/2016 CLINICAL DATA:  Chronic  pain.  No reported injury. EXAM: LEFT SHOULDER - 2+ VIEW COMPARISON:  08/16/2009. FINDINGS: No acute bony or joint abnormality identified. No evidence of fracture or dislocation. Mild acromioclavicular and glenohumeral degenerative change. IMPRESSION: No acute or focal abnormality. Mild degenerative changes left shoulder . Electronically Signed   By: Marcello Moores  Register   On: 06/28/2016 06:47   Dg Abd Portable 1v  Result Date: 07/05/2016 CLINICAL DATA:  Small-bowel obstruction. EXAM: PORTABLE ABDOMEN - 1 VIEW COMPARISON:  07/04/2016 FINDINGS: NG tube in the stomach. Stomach is decompressed. No dilated loops of bowel identified. No bowel wall edema. Lumbar fusion hardware unchanged. IMPRESSION: Nonobstructive bowel gas pattern.  NG tube remains in the stomach. Electronically Signed   By: Franchot Gallo M.D.   On: 07/05/2016 08:43   Dg Abd Portable 1v-small Bowel Obstruction Protocol-24 Hr Delay  Result Date: 07/04/2016 CLINICAL DATA:  Delayed image for assessment of small bowel obstruction. EXAM: PORTABLE ABDOMEN - 1 VIEW COMPARISON:  07/03/2016 FINDINGS: Contrast injected into the stomach under previous exam is no longer visualized. It has passed. There is no evidence of small-bowel obstruction. Nasogastric tube is well positioned within the mid stomach. Bowel gas pattern is unremarkable. IMPRESSION: 1. No evidence of a small-bowel obstruction. Electronically Signed   By: Lajean Manes M.D.   On: 07/04/2016 09:15   Dg Abd Portable 1v-small Bowel Obstruction Protocol-initial, 8 Hr Delay  Result Date: 07/03/2016 CLINICAL DATA:  70 year old female with small bowel obstruction. Subsequent encounter. EXAM: PORTABLE ABDOMEN - 1 VIEW COMPARISON:  07/02/2016 plain film and CT. FINDINGS: Nasogastric tube with tip gastric body level. Contrast and gas-filled dilated stomach. Gas-filled small bowel loops. The caliber of the small bowel loops is not adequately assessed by plain film examination (as small bowel loops  may be more fluid-filled than gas-filled when compared to prior exam) and small bowel obstructive pattern may still be present. The possibility of free intraperitoneal air cannot be assessed on a supine view. Postsurgical changes lumbar spine. IMPRESSION: Contrast and gas-filled dilated stomach. Gas-filled small bowel loops. The caliber of the small bowel loops is not adequately assessed by plain film examination (as small bowel loops may be more fluid-filled than gas-filled when compared to prior exam) and small bowel obstructive pattern may still be present. Electronically Signed   By: Genia Del M.D.   On: 07/03/2016 15:51   Dg Abd Portable 1 View  Result Date: 07/02/2016 CLINICAL DATA:  NG tube placement. EXAM: PORTABLE ABDOMEN - 1 VIEW COMPARISON:  CT abdomen/ pelvis earlier this day FINDINGS: Tip and side port of the enteric tube below the diaphragm in the stomach. Gaseous gastric distention. Small bowel dilatation is partially included. Bibasilar atelectasis is seen. IMPRESSION: Tip and side port of the enteric tube below the diaphragm in the stomach. Electronically Signed   By: Fonnie Birkenhead.D.  On: 07/02/2016 23:25   Dg Hip Unilat W Or W/o Pelvis 2-3 Views Left  Result Date: 06/28/2016 CLINICAL DATA:  Chronic pain. EXAM: DG HIP (WITH OR WITHOUT PELVIS) 2-3V LEFT COMPARISON:  05/22/2016.  MRI 02/08/2016. FINDINGS: Prior lumbosacral spine fusion. Degenerative changes lumbar spine, both SI joints, both hips. Osteitis pubis again noted. IMPRESSION: No acute abnormality. Prior lumbosacral spine fusion. Degenerative changes both SI joints and hips. Osteitis pubis again noted. Electronically Signed   By: Marcello Moores  Register   On: 06/28/2016 06:50   Dg Hip Unilat W Or W/o Pelvis 2-3 Views Right  Result Date: 06/28/2016 CLINICAL DATA:  Chronic pain.  No reported injury. EXAM: DG HIP (WITH OR WITHOUT PELVIS) 2-3V RIGHT COMPARISON:  CT 06/01/2016.  MRI 02/08/2016. FINDINGS: Lumbosacral spine fusion.  Degenerative changes lumbar spine, both SI joints, both hips. Osteitis pubis again noted. No acute abnormality. No evidence of fracture dislocation. IMPRESSION: Lumbosacral spine fusion. Degenerative changes lumbar spine, both SI joints, both hips. Osteitis pubis again noted. No acute bony abnormality identified . Electronically Signed   By: Marcello Moores  Register   On: 06/28/2016 06:51   US Abdomen Limited Ruq  Result Date: 07/03/2016 CLINICAL DATA:  Elevated LFTs.  Initial encounter. EXAM: US ABDOMEN LIMITED - RIGHT UPPER QUADRANT COMPARISON:  CT of the abdomen and pelvis from 07/02/2016 FINDINGS: Gallbladder: Status post cholecystectomy.  No retained stones seen. Common bile duct: Diameter: 0.3 cm, within normal limits in caliber. Difficult to fully characterize due to overlying structures. Liver: No focal lesion identified. Within normal limits in parenchymal echogenicity. IMPRESSION: Status post cholecystectomy. No retained stones seen. Unremarkable ultrasound of the right upper quadrant. On further assessment of the recent CT of the abdomen and pelvis, there is soft tissue inflammation tracking about the pancreatic head and second segment of the duodenum. Given clinical concern, this could reflect mild acute pancreatitis. There is no evidence of devascularization or pseudocyst formation on the recent prior CT. Note that the pancreas is typically not well assessed on ultrasound. Electronically Signed   By: Garald Balding M.D.   On: 07/03/2016 03:58     LOS: 11 days   Oren Binet, MD  [facility] Pager:[phone]  If 7PM-7AM, please contact night-coverage [email or web address] Password [email or web address] 07/13/2016, 11:44 AM

## 2016-07-13 NOTE — Care Management Important Message (Signed)
Important Message  Patient Details  Name: Amy [name] MRN: [id number] Date of Birth: Jun 11, 1946   Medicare Important Message Given:  Yes    Iris Bratton 07/13/2016, 4:13 PM

## 2016-07-14 LAB — GLUCOSE, CAPILLARY
Glucose-Capillary: 105 mg/dL — ABNORMAL HIGH (ref 65–99)
Glucose-Capillary: 106 mg/dL — ABNORMAL HIGH (ref 65–99)
Glucose-Capillary: 111 mg/dL — ABNORMAL HIGH (ref 65–99)
Glucose-Capillary: 86 mg/dL (ref 65–99)
Glucose-Capillary: 92 mg/dL (ref 65–99)
Glucose-Capillary: 95 mg/dL (ref 65–99)
Glucose-Capillary: 98 mg/dL (ref 65–99)

## 2016-07-14 MED ORDER — ENSURE ENLIVE PO LIQD
237.0000 mL | Freq: Two times a day (BID) | ORAL | Status: DC
  Administered 2016-07-14 – 2016-07-16 (×6): 237 mL via ORAL

## 2016-07-14 MED ORDER — OXYCODONE HCL 5 MG PO TABS
5.0000 mg | ORAL_TABLET | Freq: Four times a day (QID) | ORAL | Status: DC | PRN
  Administered 2016-07-16: 10 mg via ORAL
  Filled 2016-07-14: qty 2

## 2016-07-14 NOTE — Progress Notes (Signed)
PROGRESS NOTE        PATIENT DETAILS Name: Amy [name] Age: 70 y.o. Sex: female Date of Birth: Aug 06, 1946 Admit Date: 07/02/2016 Admitting Physician Lily Kocher, MD [id number], [facility], MD  Brief Narrative: Patient is a 70 y.o. female with past medical history of hypertension, GERD, DVT (not on anticoagulation), chronic pain syndrome who presented to ED with small bowel obstruction. She was initially managed with conservative measures, but subsequently underwent exploratory laparotomy and lysis of adhesions on 10/13.  Subjective: No BM today-yet  Tolerating full  liquids  No chest pain No SOB  Assessment/Plan: Principal Problem: SBO (small bowel obstruction): Initially conservative measures attempted, however required exploratory laparotomy with lysis of adhesions. Postoperatively, placed on TNA., with return of bowel function-her diet has been advanced to full liquids. No longer on TNA. Postoperative care including wound care deferred to general surgery   Active Problems: Hypokalemia: Repleted. Suspect secondary to GI loss from NG tube.  Sinus tachycardia: Resolved. Felt to be secondary to sympathetic response from anxiety/NG tube placement, TSH within normal limits, CT angiogram of the chest negative for pulmonary embolism. 2-D echocardiogram with preserved ejection fraction.  Hypertension: Better controlled-change from transdermal to oral clonidine, continue metoprolol-add home dose of hydralazine. We will continue to follow and adjust accordingly   GERD: Continue PPI.  Elevated lipase levels: Probably secondary to bowel obstruction rather than pancreatitis. CT scan of the abdomen on 10/9 and 10/12  did not show any obvious pancreatitis  Allergic rhinitis: Continue Flonase  Anxiety/depression: Resume Celexa, hopefully with clinical improvement in her anxiety levels will abate.  History of neuropathy/chronic pain syndrome: Resume  gabapentin.  DVT Prophylaxis: Prophylactic Lovenox   Code Status:  DNR  Family Communication: None at bedside  Disposition Plan: Remain inpatient- SNF on discharge likely on 10/23  Antimicrobial agents: None  Procedures: LAPAROSCOPY DIAGNOSTIC EMERGENT TO OPEN EXPLORATORY LAPAROTOMY, LYSIS OF ADHESIONS - 07/06/2016  CONSULTS:  general surgery  Time spent: 25- minutes-Greater than 50% of this time was spent in counseling, explanation of diagnosis, planning of further management, and coordination of care.  MEDICATIONS: Anti-infectives    Start     Dose/Rate Route Frequency Ordered Stop   07/06/16 1600  ciprofloxacin (CIPRO) IVPB 400 mg     400 mg 200 mL/hr over 60 Minutes Intravenous To [facility] 07/06/16 0956 07/06/16 1254      Scheduled Meds: . chlorhexidine  15 mL Mouth Rinse BID  . cloNIDine  0.1 mg Oral TID  . enoxaparin (LOVENOX) injection  60 mg Subcutaneous Q24H  . famotidine  20 mg Oral BID  . feeding supplement  1 Container Oral BID BM  . feeding supplement (ENSURE ENLIVE)  237 mL Oral BID BM  . gabapentin  300 mg Oral BID  . hydrALAZINE  25 mg Oral TID  . mouth rinse  15 mL Mouth Rinse q12n4p  . meloxicam  15 mg Oral Daily  . metoprolol tartrate  50 mg Oral BID  . pantoprazole  40 mg Oral BID  . PARoxetine  40 mg Oral Daily   Continuous Infusions:   PRN Meds:.acetaminophen **OR** acetaminophen, hydrALAZINE, levalbuterol, loratadine, magic mouthwash, menthol-cetylpyridinium, metoprolol, morphine injection, ondansetron (ZOFRAN) IV, oxyCODONE, sodium chloride flush   PHYSICAL EXAM: Vital signs: Vitals:   07/13/16 1815 07/13/16 2014 07/14/16 0413 07/14/16 0925  BP: (!) 144/80 (!) 143/70 136/71 125/68  Pulse:  83 77 75  Resp:  19 18 17   Temp:  98.2 F (36.8 C) 98 F (36.7 C) 98.1 F (36.7 C)  TempSrc:  Oral Oral Oral  SpO2:  98% 99% 94%  Weight:  123.5 kg (272 lb 3.2 oz)    Height:       Filed Weights   07/11/16 2113 07/12/16 2020  07/13/16 2014  Weight: 121.1 kg (267 lb) 121.6 kg (268 lb) 123.5 kg (272 lb 3.2 oz)   Body mass index is 45.3 kg/m.   General appearance :Awake, alert, not in any distress. Speech Clear.  Eyes:, pupils equally reactive to light and accomodation,no scleral icterus.Pink conjunctiva HEENT: Atraumatic and Normocephalic Neck: supple, no JVD. No cervical lymphadenopathy. No thyromegaly Resp:Good air entry bilaterally, no added sounds  CVS: S1 S2 regular, no murmurs.  GI: Bowel sounds Sluggish, appropriately tender. Not distended.Midline wound clean-staples in place Extremities: B/L Lower Ext shows no edema, both legs are warm to touch Neurology:  speech clear,Non focal, sensation is grossly intact. Psychiatric: Normal judgment and insight. Alert and oriented x 3. Normal mood. Musculoskeletal:gait appears to be normal.No digital cyanosis Skin:No Rash, warm and dry Wounds:N/A  I have personally reviewed following labs and imaging studies  LABORATORY DATA: CBC:  Recent Labs Lab 07/08/16 0554 07/10/16 1407 07/11/16 0433  WBC 11.3* 12.2* 11.5*  NEUTROABS  --   --  9.0*  HGB 13.1 12.5 11.2*  HCT 38.5 37.0 33.5*  MCV 90.4 89.6 89.8  PLT 232 291 [id number]    Basic Metabolic Panel:  Recent Labs Lab 07/10/16 1407 07/11/16 0433 07/11/16 2000 07/12/16 0450 07/13/16 0500  NA 144 141 142 141 139  K 3.3* 2.6* 3.1* 3.4* 3.6  CL 108 105 109 107 105  CO2 27 25 27 28 29   GLUCOSE 105* 91 106* 132* 107*  BUN 11 13 14 13 12   CREATININE 0.73 0.77 0.61 0.55 0.57  CALCIUM 8.5* 8.4* 8.3* 8.1* 8.0*  MG  --  1.9  --  2.0 2.0  PHOS  --  4.0  --  2.6 2.9    GFR: Estimated Creatinine Clearance: 86.4 mL/min (by C-G formula based on SCr of 0.57 mg/dL).  Liver Function Tests:  Recent Labs Lab 07/10/16 1407 07/11/16 0433 07/12/16 0450  AST 34 38 28  ALT 32 37 33  ALKPHOS 88 94 89  BILITOT 0.7 0.6 0.4  PROT 6.8 6.5 6.0*  ALBUMIN 2.3* 2.1* 2.1*   No results for input(s): LIPASE, AMYLASE in  the last 168 hours. No results for input(s): AMMONIA in the last 168 hours.  Coagulation Profile: No results for input(s): INR, PROTIME in the last 168 hours.  Cardiac Enzymes:  Recent Labs Lab 07/07/16 1832 07/10/16 1407 07/10/16 1907 07/10/16 2231 07/11/16 0433  TROPONINI 0.03* <0.03 <0.03 0.09* 0.04*    BNP (last 3 results) No results for input(s): PROBNP in the last 8760 hours.  HbA1C: No results for input(s): HGBA1C in the last 72 hours.  CBG:  Recent Labs Lab 07/13/16 1646 07/13/16 2005 07/14/16 0007 07/14/16 0404 07/14/16 0804  GLUCAP 91 92 95 86 106*    Lipid Profile: No results for input(s): CHOL, HDL, LDLCALC, TRIG, CHOLHDL, LDLDIRECT in the last 72 hours.  Thyroid Function Tests: No results for input(s): TSH, T4TOTAL, FREET4, T3FREE, THYROIDAB in the last 72 hours.  Anemia Panel: No results for input(s): VITAMINB12, FOLATE, FERRITIN, TIBC, IRON, RETICCTPCT in the last 72 hours.  Urine analysis:    Component Value Date/Time  COLORURINE AMBER (A) 07/03/2016 0721   APPEARANCEUR CLEAR 07/03/2016 0721   LABSPEC >1.046 (H) 07/03/2016 0721   PHURINE 6.0 07/03/2016 0721   GLUCOSEU NEGATIVE 07/03/2016 0721   HGBUR NEGATIVE 07/03/2016 0721   BILIRUBINUR SMALL (A) 07/03/2016 0721   KETONESUR NEGATIVE 07/03/2016 0721   PROTEINUR 100 (A) 07/03/2016 0721   UROBILINOGEN 1.0 04/21/2014 0756   NITRITE NEGATIVE 07/03/2016 0721   LEUKOCYTESUR NEGATIVE 07/03/2016 0721    Sepsis Labs: Lactic Acid, Venous No results found for: LATICACIDVEN  MICROBIOLOGY: Recent Results (from the past 240 hour(s))  MRSA PCR Screening     Status: None   Collection Time: 07/07/16  6:44 PM  Result Value Ref Range Status   MRSA by PCR NEGATIVE NEGATIVE Final    Comment:        The GeneXpert MRSA Assay (FDA approved for NASAL specimens only), is one component of a comprehensive MRSA colonization surveillance program. It is not intended to diagnose MRSA infection nor to  guide or monitor treatment for MRSA infections.     RADIOLOGY STUDIES/RESULTS: Dg Chest 2 View  Result Date: 07/02/2016 CLINICAL DATA:  Mid lower chest and left sided chest pain up into left shoulder. Severe abd pain and tightness. Dizziness. N/V. SOB. Rapid heartbeat. Skin feels cold - almost clammy, but she states she is burning up. Symptoms have increased in severity and frequency over the last week. EXAM: CHEST  2 VIEW COMPARISON:  01/05/2016 FINDINGS: Lung volumes are low. There is linear/streaky type opacity at the lung bases there is most likely atelectasis. Infection is possible. Remainder of the lungs is clear. No pleural effusion or pneumothorax. Cardiac silhouette is normal in size. No mediastinal or hilar masses. No evidence of adenopathy. Skeletal structures are intact. IMPRESSION: 1. Lung base opacity accentuated by low lung volumes. This is most likely atelectasis. Consider pneumonia if there are consistent clinical symptoms. 2. No other evidence of acute cardiopulmonary disease. Electronically Signed   By: Lajean Manes M.D.   On: 07/02/2016 16:38   Dg Cervical Spine Complete  Result Date: 06/28/2016 CLINICAL DATA:  Chronic neck pain, no known injury, initial encounter EXAM: CERVICAL SPINE - COMPLETE 4+ VIEW COMPARISON:  None. FINDINGS: Seven cervical segments are well visualized. Vertebral body height is well maintained. No prevertebral soft tissue changes are seen. Mild facet hypertrophic changes are noted. No significant neural foraminal narrowing is seen. The odontoid is within normal limits. IMPRESSION: Mild degenerative change without acute abnormality. Electronically Signed   By: Inez Catalina M.D.   On: 06/28/2016 08:38   Dg Lumbar Spine Complete W/bend  Result Date: 06/28/2016 CLINICAL DATA:  Low back pain, no known injury, initial encounter EXAM: LUMBAR SPINE - COMPLETE WITH BENDING VIEWS COMPARISON:  05/22/2016 FINDINGS: Multilevel postoperative changes are noted throughout  the lumbar spine. The overall appearance of the hardware is stable. Vertebral body height is well maintained. Aortic calcifications are seen. Flexion and extension views show no definitive instability. IMPRESSION: Multilevel postoperative changes. No instability is seen on flexion and extension. No acute abnormality is noted. Electronically Signed   By: Inez Catalina M.D.   On: 06/28/2016 08:40   Dg Si Joints  Result Date: 06/28/2016 CLINICAL DATA:  Chronic pain.  No reported injury . EXAM: BILATERAL SACROILIAC JOINTS - 3+ VIEW COMPARISON:  CT 06/01/2016.  MRI 02/08/2016 FINDINGS: Lumbosacral spine fusion. Degenerative changes lumbar spine and both SI joints. Degenerative changes both hips. Osteitis pubis again noted. No acute abnormality identified. Pelvic calcifications consistent phleboliths noted. IMPRESSION:  Lumbosacral spine fusion. Degenerative changes lumbar spine, both SI joints, both hips. Osteitis pubis again noted. No acute abnormality identified. Electronically Signed   By: Marcello Moores  Register   On: 06/28/2016 06:53   Dg Shoulder Right  Result Date: 06/28/2016 CLINICAL DATA:  Chronic pain.  No recent prior. EXAM: RIGHT SHOULDER - 2+ VIEW COMPARISON:  01/05/2016. FINDINGS: Acromioclavicular glenohumeral degenerative change. Subacromial spurring is noted. No evidence of fracture or dislocation. IMPRESSION: 1.  No acute abnormality. 2. Degenerative changes right shoulder with subacromial spurring. Electronically Signed   By: Marcello Moores  Register   On: 06/28/2016 06:47   Dg Knee 1-2 Views Left  Result Date: 06/28/2016 CLINICAL DATA:  Knee pain common no known injury, initial encounter EXAM: LEFT KNEE - 1-2 VIEW COMPARISON:  None. FINDINGS: No acute fracture or dislocation is noted. No joint effusion is seen. Very minimal medial joint space narrowing is noted. IMPRESSION: Minimal degenerative change without acute abnormality. Electronically Signed   By: Inez Catalina M.D.   On: 06/28/2016 08:38   Dg  Knee 1-2 Views Right  Result Date: 06/28/2016 CLINICAL DATA:  Right knee pain, no known injury, initial encounter EXAM: RIGHT KNEE - 1-2 VIEW COMPARISON:  None. FINDINGS: No evidence of fracture, dislocation, or joint effusion. Mild medial joint space narrowing is noted. No soft tissue abnormality is seen. IMPRESSION: Minimal degenerative changes. Electronically Signed   By: Inez Catalina M.D.   On: 06/28/2016 08:39   Ct Angio Chest Pe W Or Wo Contrast  Result Date: 07/07/2016 CLINICAL DATA:  Exploratory laparotomy yesterday. Shortness of breath. Evaluate for pulmonary embolus. EXAM: CT ANGIOGRAPHY CHEST WITH CONTRAST TECHNIQUE: Multidetector CT imaging of the chest was performed using the standard protocol during bolus administration of intravenous contrast. Multiplanar CT image reconstructions and MIPs were obtained to evaluate the vascular anatomy. CONTRAST:  100 cc Isovue 370 COMPARISON:  None. FINDINGS: Cardiovascular: Heart size upper normal. No pericardial effusion. No thoracic aortic aneurysm. Assessment of segmental and subsegmental pulmonary arteries to the lower lobes is degraded by respiratory motion, but within this limitation there is no CT evidence for an acute pulmonary embolus. Mediastinum/Nodes: No mediastinal lymphadenopathy. There is no hilar lymphadenopathy. There is no axillary lymphadenopathy. NG tube passes into the stomach with nonvisualization of the distal tip. Lungs/Pleura: Bilateral lower lobe subsegmental atelectasis noted. Focal bandlike opacity in the lingula likely also atelectatic. 7 mm ground-glass nodule identified right lower lobe (image 59 series 5). No evidence for pneumothorax. No pleural effusion. Upper Abdomen: Stomach is fluid distended despite the presence of an NG tube. Musculoskeletal: Bone windows reveal no worrisome lytic or sclerotic osseous lesions. Review of the MIP images confirms the above findings. IMPRESSION: 1. No CT evidence for acute pulmonary embolus.  2. Bilateral lower lobe subsegmental atelectasis with probable lingular atelectasis associated. 3. Stomach is fluid distended despite the presence of an NG tube. Electronically Signed   By: Misty Stanley M.D.   On: 07/07/2016 14:31   Ct Abdomen Pelvis W Contrast  Result Date: 07/05/2016 CLINICAL DATA:  70 year old female with recently diagnosed small bowel obstruction and pancreatitis. Continued abdominal pelvic pain with nausea. EXAM: CT ABDOMEN AND PELVIS WITH CONTRAST TECHNIQUE: Multidetector CT imaging of the abdomen and pelvis was performed using the standard protocol following bolus administration of intravenous contrast. CONTRAST:  100 cc intravenous Isovue 300 COMPARISON:  07/02/2016 and prior CTs. FINDINGS: Lower chest: Increased moderate bibasilar atelectasis identified. Hepatobiliary: The liver is unremarkable. The patient is status post cholecystectomy. There is no evidence of  biliary dilatation. Pancreas: Unremarkable Spleen: Unremarkable Adrenals/Urinary Tract: The kidneys, adrenal glands and bladder are unremarkable except for unchanged 1.7 cm left adrenal adenoma. Stomach/Bowel: An NG tube is present with tip in the mid stomach. Dilated proximal-mid small bowel loops are again identified with collapsed distal small bowel loops, compatible with small bowel obstruction. There has been interval decreased caliber of stomach and dilated small bowel loops since the prior study. There is no evidence of definite bowel wall thickening or pneumoperitoneum. Vascular/Lymphatic: Abdominal aortic atherosclerotic calcifications noted without aneurysm. No enlarged lymph nodes identified. Reproductive: Patient is status post hysterectomy. No adnexal masses identified. Other: No free fluid or focal collection/abscess. Musculoskeletal: Surgical changes within the lumbar spine again identified. No acute or suspicious abnormality. IMPRESSION: Small bowel obstruction again noted, with decompression of stomach and  decreased caliber of dilated small bowel loops from NG tube within the stomach. No evidence of abscess, free fluid or pneumoperitoneum. Increased moderate bibasilar atelectasis. Abdominal aortic atherosclerosis. Electronically Signed   By: Margarette Canada M.D.   On: 07/05/2016 13:26   Ct Abdomen Pelvis W Contrast  Result Date: 07/02/2016 CLINICAL DATA:  Epigastric abdominal pain for 5 days. Vomiting starting last night. Prior hysterectomy. Prior abdominal adhesion removal. EXAM: CT ABDOMEN AND PELVIS WITH CONTRAST TECHNIQUE: Multidetector CT imaging of the abdomen and pelvis was performed using the standard protocol following bolus administration of intravenous contrast. CONTRAST:  139mL ISOVUE-300 IOPAMIDOL (ISOVUE-300) INJECTION 61% COMPARISON:  01/30/2016 FINDINGS: Lower chest: Bibasilar atelectasis. Normal heart size without pericardial or pleural effusion. Fluid in the distal esophagus. Hepatobiliary: Normal liver. Cholecystectomy, without biliary ductal dilatation. Pancreas: Normal, without mass or ductal dilatation. Spleen: Normal in size, without focal abnormality. Adrenals/Urinary Tract: Normal right adrenal gland. Left adrenal nodule measures 1.7 cm and is been similar in back to 06/23/2015, consistent with an adenoma on that study. Normal kidneys, without hydronephrosis. Normal urinary bladder. Stomach/Bowel: The stomach is distended and fluid-filled. The colon is normal to minimally decompressed distally. Decompressed terminal and distal ileum. Proximal and mid small bowel loops are dilated, including at 3.7 cm on image 56/series 2. Relatively gradual transition occurs, including within left paracentral anteriorly positioned small bowel loops on image 52/series 2. No obstructive mass. No findings to suggest complicating ischemia. Minimal nonspecific mesenteric interloop edema is seen including on image 56/series 2. Vascular/Lymphatic: Advanced aortic and branch vessel atherosclerosis. No abdominopelvic  adenopathy. Reproductive: Hysterectomy.  No adnexal mass. Other: No significant free fluid. No pneumatosis or free intraperitoneal air. Musculoskeletal: Lumbosacral spine fixation. IMPRESSION: 1. Mid small bowel obstruction, likely secondary to adhesions. No complicating ischemia. 2. Distended, fluid-filled stomach with fluid in the esophagus. The patient would likely benefit from nasogastric tube placement. 3. Advanced aortic atherosclerosis. 4. Left adrenal adenoma. Electronically Signed   By: Abigail Miyamoto M.D.   On: 07/02/2016 20:44   Dg Chest Port 1 View  Result Date: 07/10/2016 CLINICAL DATA:  Shortness of breath, hypertension, asthma EXAM: PORTABLE CHEST 1 VIEW COMPARISON:  07/07/2016 FINDINGS: Cardiomediastinal silhouette is stable. Mild basilar atelectasis. No pulmonary edema. NG tube in place. Linear atelectasis or infiltrate in lingula. IMPRESSION: No pulmonary edema. Mild basilar atelectasis. Linear atelectasis or infiltrate in lingula. Electronically Signed   By: Lahoma Crocker M.D.   On: 07/10/2016 11:07   Dg Chest Port 1 View  Result Date: 07/05/2016 CLINICAL DATA:  Chest pain EXAM: PORTABLE CHEST 1 VIEW COMPARISON:  07/02/2016 FINDINGS: Cardiomediastinal silhouette is stable. Stable atelectasis or scarring right base. There is streaky atelectasis or infiltrate left  base. No pulmonary edema. NG tube poorly visualized due to patient's large body habitus. IMPRESSION: No pulmonary edema. Right base atelectasis or scarring. Streaky left basilar atelectasis or infiltrate. Electronically Signed   By: Lahoma Crocker M.D.   On: 07/05/2016 12:28   Dg Shoulder Left  Result Date: 06/28/2016 CLINICAL DATA:  Chronic pain.  No reported injury. EXAM: LEFT SHOULDER - 2+ VIEW COMPARISON:  08/16/2009. FINDINGS: No acute bony or joint abnormality identified. No evidence of fracture or dislocation. Mild acromioclavicular and glenohumeral degenerative change. IMPRESSION: No acute or focal abnormality. Mild  degenerative changes left shoulder . Electronically Signed   By: Marcello Moores  Register   On: 06/28/2016 06:47   Dg Abd Portable 1v  Result Date: 07/05/2016 CLINICAL DATA:  Small-bowel obstruction. EXAM: PORTABLE ABDOMEN - 1 VIEW COMPARISON:  07/04/2016 FINDINGS: NG tube in the stomach. Stomach is decompressed. No dilated loops of bowel identified. No bowel wall edema. Lumbar fusion hardware unchanged. IMPRESSION: Nonobstructive bowel gas pattern.  NG tube remains in the stomach. Electronically Signed   By: Franchot Gallo M.D.   On: 07/05/2016 08:43   Dg Abd Portable 1v-small Bowel Obstruction Protocol-24 Hr Delay  Result Date: 07/04/2016 CLINICAL DATA:  Delayed image for assessment of small bowel obstruction. EXAM: PORTABLE ABDOMEN - 1 VIEW COMPARISON:  07/03/2016 FINDINGS: Contrast injected into the stomach under previous exam is no longer visualized. It has passed. There is no evidence of small-bowel obstruction. Nasogastric tube is well positioned within the mid stomach. Bowel gas pattern is unremarkable. IMPRESSION: 1. No evidence of a small-bowel obstruction. Electronically Signed   By: Lajean Manes M.D.   On: 07/04/2016 09:15   Dg Abd Portable 1v-small Bowel Obstruction Protocol-initial, 8 Hr Delay  Result Date: 07/03/2016 CLINICAL DATA:  70 year old female with small bowel obstruction. Subsequent encounter. EXAM: PORTABLE ABDOMEN - 1 VIEW COMPARISON:  07/02/2016 plain film and CT. FINDINGS: Nasogastric tube with tip gastric body level. Contrast and gas-filled dilated stomach. Gas-filled small bowel loops. The caliber of the small bowel loops is not adequately assessed by plain film examination (as small bowel loops may be more fluid-filled than gas-filled when compared to prior exam) and small bowel obstructive pattern may still be present. The possibility of free intraperitoneal air cannot be assessed on a supine view. Postsurgical changes lumbar spine. IMPRESSION: Contrast and gas-filled dilated  stomach. Gas-filled small bowel loops. The caliber of the small bowel loops is not adequately assessed by plain film examination (as small bowel loops may be more fluid-filled than gas-filled when compared to prior exam) and small bowel obstructive pattern may still be present. Electronically Signed   By: Genia Del M.D.   On: 07/03/2016 15:51   Dg Abd Portable 1 View  Result Date: 07/02/2016 CLINICAL DATA:  NG tube placement. EXAM: PORTABLE ABDOMEN - 1 VIEW COMPARISON:  CT abdomen/ pelvis earlier this day FINDINGS: Tip and side port of the enteric tube below the diaphragm in the stomach. Gaseous gastric distention. Small bowel dilatation is partially included. Bibasilar atelectasis is seen. IMPRESSION: Tip and side port of the enteric tube below the diaphragm in the stomach. Electronically Signed   By: Jeb Levering M.D.   On: 07/02/2016 23:25   Dg Hip Unilat W Or W/o Pelvis 2-3 Views Left  Result Date: 06/28/2016 CLINICAL DATA:  Chronic pain. EXAM: DG HIP (WITH OR WITHOUT PELVIS) 2-3V LEFT COMPARISON:  05/22/2016.  MRI 02/08/2016. FINDINGS: Prior lumbosacral spine fusion. Degenerative changes lumbar spine, both SI joints, both hips. Osteitis pubis  again noted. IMPRESSION: No acute abnormality. Prior lumbosacral spine fusion. Degenerative changes both SI joints and hips. Osteitis pubis again noted. Electronically Signed   By: Marcello Moores  Register   On: 06/28/2016 06:50   Dg Hip Unilat W Or W/o Pelvis 2-3 Views Right  Result Date: 06/28/2016 CLINICAL DATA:  Chronic pain.  No reported injury. EXAM: DG HIP (WITH OR WITHOUT PELVIS) 2-3V RIGHT COMPARISON:  CT 06/01/2016.  MRI 02/08/2016. FINDINGS: Lumbosacral spine fusion. Degenerative changes lumbar spine, both SI joints, both hips. Osteitis pubis again noted. No acute abnormality. No evidence of fracture dislocation. IMPRESSION: Lumbosacral spine fusion. Degenerative changes lumbar spine, both SI joints, both hips. Osteitis pubis again noted. No acute  bony abnormality identified . Electronically Signed   By: Marcello Moores  Register   On: 06/28/2016 06:51   US Abdomen Limited Ruq  Result Date: 07/03/2016 CLINICAL DATA:  Elevated LFTs.  Initial encounter. EXAM: US ABDOMEN LIMITED - RIGHT UPPER QUADRANT COMPARISON:  CT of the abdomen and pelvis from 07/02/2016 FINDINGS: Gallbladder: Status post cholecystectomy.  No retained stones seen. Common bile duct: Diameter: 0.3 cm, within normal limits in caliber. Difficult to fully characterize due to overlying structures. Liver: No focal lesion identified. Within normal limits in parenchymal echogenicity. IMPRESSION: Status post cholecystectomy. No retained stones seen. Unremarkable ultrasound of the right upper quadrant. On further assessment of the recent CT of the abdomen and pelvis, there is soft tissue inflammation tracking about the pancreatic head and second segment of the duodenum. Given clinical concern, this could reflect mild acute pancreatitis. There is no evidence of devascularization or pseudocyst formation on the recent prior CT. Note that the pancreas is typically not well assessed on ultrasound. Electronically Signed   By: Garald Balding M.D.   On: 07/03/2016 03:58     LOS: 12 days   Oren Binet, MD  [facility] Pager:[phone]  If 7PM-7AM, please contact night-coverage [email or web address] Password [email or web address] 07/14/2016, 10:41 AM

## 2016-07-14 NOTE — Progress Notes (Signed)
8 Days Post-Op  Subjective: Passing flatus, having bowel function, doesn't want more than fulls, not walking much, no n/v  Objective: Vital signs in last 24 hours: Temp:  [98 F (36.7 C)-98.9 F (37.2 C)] 98 F (36.7 C) (10/21 0413) Pulse Rate:  [77-86] 77 (10/21 0413) Resp:  [17-19] 18 (10/21 0413) BP: (136-182)/(70-98) 136/71 (10/21 0413) SpO2:  [96 %-99 %] 99 % (10/21 0413) Weight:  [123.5 kg (272 lb 3.2 oz)] 123.5 kg (272 lb 3.2 oz) (10/20 2014) Last BM Date: 07/13/16  Intake/Output from previous day: 10/20 0701 - 10/21 0700 In: 1140 [P.O.:1140] Out: 0  Intake/Output this shift: No intake/output data recorded.  GI: bs present incision clean with staples approp tender  Lab Results:  No results for input(s): WBC, HGB, HCT, PLT in the last 72 hours. BMET  Recent Labs  07/12/16 0450 07/13/16 0500  NA 141 139  K 3.4* 3.6  CL 107 105  CO2 28 29  GLUCOSE 132* 107*  BUN 13 12  CREATININE 0.55 0.57  CALCIUM 8.1* 8.0*   PT/INR No results for input(s): LABPROT, INR in the last 72 hours. ABG  Recent Labs  07/11/16 1245  PHART 7.537*  HCO3 24.5    Studies/Results: No results found.  Anti-infectives: Anti-infectives    Start     Dose/Rate Route Frequency Ordered Stop   07/06/16 1600  ciprofloxacin (CIPRO) IVPB 400 mg     400 mg 200 mL/hr over 60 Minutes Intravenous To [facility] 07/06/16 0956 07/06/16 1254      Assessment/Plan: POD 8 elap with loa- wilson - Keep staples until POD10-14 per operating surgeon - stay on fulls, no more tna, can advance diet when patient wants to, add supplements -needs to ambulate more VTE - lovenox   WAKEFIELD,MATTHEW 07/14/2016

## 2016-07-15 LAB — GLUCOSE, CAPILLARY
Glucose-Capillary: 97 mg/dL (ref 65–99)
Glucose-Capillary: 95 mg/dL (ref 65–99)
Glucose-Capillary: 124 mg/dL — ABNORMAL HIGH (ref 65–99)
Glucose-Capillary: 102 mg/dL — ABNORMAL HIGH (ref 65–99)
Glucose-Capillary: 90 mg/dL (ref 65–99)

## 2016-07-15 NOTE — Progress Notes (Signed)
PROGRESS NOTE        PATIENT DETAILS Name: Amy [name] Age: 70 y.o. Sex: female Date of Birth: Jul 14, 1946 Admit Date: 07/02/2016 Admitting Physician Lily Kocher, MD [id number], [facility], MD  Brief Narrative: Patient is a 70 y.o. female with past medical history of hypertension, GERD, DVT (not on anticoagulation), chronic pain syndrome who presented to ED with small bowel obstruction. She was initially managed with conservative measures, but subsequently underwent exploratory laparotomy and lysis of adhesions on 10/13.  Subjective: Mild abdominal pain-had BM yesterday, none today.  Assessment/Plan: Principal Problem: SBO (small bowel obstruction): Initially conservative measures attempted, however required exploratory laparotomy with lysis of adhesions. Postoperatively, placed on TNA., with return of bowel function-her diet has really been advanced to soft diet.  Postoperative care including wound care deferred to general surgery   Active Problems: Hypokalemia: Repleted. Suspect secondary to GI loss from NG tube.  Sinus tachycardia: Resolved. Felt to be secondary to sympathetic response from anxiety/NG tube placement, TSH within normal limits, CT angiogram of the chest negative for pulmonary embolism. 2-D echocardiogram with preserved ejection fraction.  Hypertension: Better controlled-continue clonidine,  Metoprolol,hydralazine. We will continue to follow and adjust accordingly   GERD: Continue PPI.  Elevated lipase levels: Probably secondary to bowel obstruction rather than pancreatitis. CT scan of the abdomen on 10/9 and 10/12  did not show any obvious pancreatitis  Allergic rhinitis: Continue Flonase  Anxiety/depression: Anxiety levels are better controlled with clinical improvement-continue Celexa.  History of neuropathy/chronic pain syndrome: Continue gabapentin.  DVT Prophylaxis: Prophylactic Lovenox   Code Status:  DNR  Family  Communication: None at bedside  Disposition Plan: Remain inpatient- SNF on discharge likely on 10/23  Antimicrobial agents: None  Procedures: LAPAROSCOPY DIAGNOSTIC EMERGENT TO OPEN EXPLORATORY LAPAROTOMY, LYSIS OF ADHESIONS - 07/06/2016  CONSULTS:  general surgery  Time spent: 25- minutes-Greater than 50% of this time was spent in counseling, explanation of diagnosis, planning of further management, and coordination of care.  MEDICATIONS: Anti-infectives    Start     Dose/Rate Route Frequency Ordered Stop   07/06/16 1600  ciprofloxacin (CIPRO) IVPB 400 mg     400 mg 200 mL/hr over 60 Minutes Intravenous To [facility] 07/06/16 0956 07/06/16 1254      Scheduled Meds: . chlorhexidine  15 mL Mouth Rinse BID  . cloNIDine  0.1 mg Oral TID  . enoxaparin (LOVENOX) injection  60 mg Subcutaneous Q24H  . famotidine  20 mg Oral BID  . feeding supplement  1 Container Oral BID BM  . feeding supplement (ENSURE ENLIVE)  237 mL Oral BID BM  . gabapentin  300 mg Oral BID  . hydrALAZINE  25 mg Oral TID  . mouth rinse  15 mL Mouth Rinse q12n4p  . meloxicam  15 mg Oral Daily  . metoprolol tartrate  50 mg Oral BID  . pantoprazole  40 mg Oral BID  . PARoxetine  40 mg Oral Daily   Continuous Infusions:   PRN Meds:.acetaminophen **OR** acetaminophen, hydrALAZINE, levalbuterol, loratadine, magic mouthwash, menthol-cetylpyridinium, metoprolol, morphine injection, ondansetron (ZOFRAN) IV, oxyCODONE, sodium chloride flush   PHYSICAL EXAM: Vital signs: Vitals:   07/14/16 1816 07/14/16 2020 07/15/16 0612 07/15/16 0902  BP: 114/64 128/67 136/63 124/73  Pulse: 77 76 72 71  Resp: 16 16 17 18   Temp: 98.7 F (37.1 C) 97.7 F (36.5 C) 98.3 F (  36.8 C) 98.5 F (36.9 C)  TempSrc: Oral Oral Oral Oral  SpO2: 98% 100% 99% 99%  Weight:  124.9 kg (275 lb 4.8 oz)    Height:       Filed Weights   07/12/16 2020 07/13/16 2014 07/14/16 2020  Weight: 121.6 kg (268 lb) 123.5 kg (272 lb 3.2  oz) 124.9 kg (275 lb 4.8 oz)   Body mass index is 45.81 kg/m.   General appearance :Awake, alert, not in any distress. Speech Clear.  Eyes:, pupils equally reactive to light and accomodation,no scleral icterus.Pink conjunctiva HEENT: Atraumatic and Normocephalic Neck: supple, no JVD. No cervical lymphadenopathy. No thyromegaly Resp:Good air entry bilaterally, no added sounds  CVS: S1 S2 regular, no murmurs.  GI: Bowel sounds Sluggish, appropriately tender. Not distended.Midline wound clean-staples in place Extremities: B/L Lower Ext shows no edema, both legs are warm to touch Neurology:  speech clear,Non focal, sensation is grossly intact. Psychiatric: Normal judgment and insight. Alert and oriented x 3. Normal mood. Musculoskeletal:gait appears to be normal.No digital cyanosis Skin:No Rash, warm and dry Wounds:N/A  I have personally reviewed following labs and imaging studies  LABORATORY DATA: CBC:  Recent Labs Lab 07/10/16 1407 07/11/16 0433  WBC 12.2* 11.5*  NEUTROABS  --  9.0*  HGB 12.5 11.2*  HCT 37.0 33.5*  MCV 89.6 89.8  PLT 291 [id number]    Basic Metabolic Panel:  Recent Labs Lab 07/10/16 1407 07/11/16 0433 07/11/16 2000 07/12/16 0450 07/13/16 0500  NA 144 141 142 141 139  K 3.3* 2.6* 3.1* 3.4* 3.6  CL 108 105 109 107 105  CO2 27 25 27 28 29   GLUCOSE 105* 91 106* 132* 107*  BUN 11 13 14 13 12   CREATININE 0.73 0.77 0.61 0.55 0.57  CALCIUM 8.5* 8.4* 8.3* 8.1* 8.0*  MG  --  1.9  --  2.0 2.0  PHOS  --  4.0  --  2.6 2.9    GFR: Estimated Creatinine Clearance: 87 mL/min (by C-G formula based on SCr of 0.57 mg/dL).  Liver Function Tests:  Recent Labs Lab 07/10/16 1407 07/11/16 0433 07/12/16 0450  AST 34 38 28  ALT 32 37 33  ALKPHOS 88 94 89  BILITOT 0.7 0.6 0.4  PROT 6.8 6.5 6.0*  ALBUMIN 2.3* 2.1* 2.1*   No results for input(s): LIPASE, AMYLASE in the last 168 hours. No results for input(s): AMMONIA in the last 168 hours.  Coagulation  Profile: No results for input(s): INR, PROTIME in the last 168 hours.  Cardiac Enzymes:  Recent Labs Lab 07/10/16 1407 07/10/16 1907 07/10/16 2231 07/11/16 0433  TROPONINI <0.03 <0.03 0.09* 0.04*    BNP (last 3 results) No results for input(s): PROBNP in the last 8760 hours.  HbA1C: No results for input(s): HGBA1C in the last 72 hours.  CBG:  Recent Labs Lab 07/14/16 2017 07/14/16 2358 07/15/16 0401 07/15/16 0818 07/15/16 1123  GLUCAP 98 92 97 95 124*    Lipid Profile: No results for input(s): CHOL, HDL, LDLCALC, TRIG, CHOLHDL, LDLDIRECT in the last 72 hours.  Thyroid Function Tests: No results for input(s): TSH, T4TOTAL, FREET4, T3FREE, THYROIDAB in the last 72 hours.  Anemia Panel: No results for input(s): VITAMINB12, FOLATE, FERRITIN, TIBC, IRON, RETICCTPCT in the last 72 hours.  Urine analysis:    Component Value Date/Time   COLORURINE AMBER (A) 07/03/2016 0721   APPEARANCEUR CLEAR 07/03/2016 0721   LABSPEC >1.046 (H) 07/03/2016 0721   PHURINE 6.0 07/03/2016 0721   GLUCOSEU NEGATIVE 07/03/2016  [place] 07/03/2016 0721   BILIRUBINUR SMALL (A) 07/03/2016 0721   KETONESUR NEGATIVE 07/03/2016 0721   PROTEINUR 100 (A) 07/03/2016 0721   UROBILINOGEN 1.0 04/21/2014 0756   NITRITE NEGATIVE 07/03/2016 0721   LEUKOCYTESUR NEGATIVE 07/03/2016 0721    Sepsis Labs: Lactic Acid, Venous No results found for: LATICACIDVEN  MICROBIOLOGY: Recent Results (from the past 240 hour(s))  MRSA PCR Screening     Status: None   Collection Time: 07/07/16  6:44 PM  Result Value Ref Range Status   MRSA by PCR NEGATIVE NEGATIVE Final    Comment:        The GeneXpert MRSA Assay (FDA approved for NASAL specimens only), is one component of a comprehensive MRSA colonization surveillance program. It is not intended to diagnose MRSA infection nor to guide or monitor treatment for MRSA infections.     RADIOLOGY STUDIES/RESULTS: Dg Chest 2 View  Result  Date: 07/02/2016 CLINICAL DATA:  Mid lower chest and left sided chest pain up into left shoulder. Severe abd pain and tightness. Dizziness. N/V. SOB. Rapid heartbeat. Skin feels cold - almost clammy, but she states she is burning up. Symptoms have increased in severity and frequency over the last week. EXAM: CHEST  2 VIEW COMPARISON:  01/05/2016 FINDINGS: Lung volumes are low. There is linear/streaky type opacity at the lung bases there is most likely atelectasis. Infection is possible. Remainder of the lungs is clear. No pleural effusion or pneumothorax. Cardiac silhouette is normal in size. No mediastinal or hilar masses. No evidence of adenopathy. Skeletal structures are intact. IMPRESSION: 1. Lung base opacity accentuated by low lung volumes. This is most likely atelectasis. Consider pneumonia if there are consistent clinical symptoms. 2. No other evidence of acute cardiopulmonary disease. Electronically Signed   By: Lajean Manes M.D.   On: 07/02/2016 16:38   Dg Cervical Spine Complete  Result Date: 06/28/2016 CLINICAL DATA:  Chronic neck pain, no known injury, initial encounter EXAM: CERVICAL SPINE - COMPLETE 4+ VIEW COMPARISON:  None. FINDINGS: Seven cervical segments are well visualized. Vertebral body height is well maintained. No prevertebral soft tissue changes are seen. Mild facet hypertrophic changes are noted. No significant neural foraminal narrowing is seen. The odontoid is within normal limits. IMPRESSION: Mild degenerative change without acute abnormality. Electronically Signed   By: Inez Catalina M.D.   On: 06/28/2016 08:38   Dg Lumbar Spine Complete W/bend  Result Date: 06/28/2016 CLINICAL DATA:  Low back pain, no known injury, initial encounter EXAM: LUMBAR SPINE - COMPLETE WITH BENDING VIEWS COMPARISON:  05/22/2016 FINDINGS: Multilevel postoperative changes are noted throughout the lumbar spine. The overall appearance of the hardware is stable. Vertebral body height is well maintained.  Aortic calcifications are seen. Flexion and extension views show no definitive instability. IMPRESSION: Multilevel postoperative changes. No instability is seen on flexion and extension. No acute abnormality is noted. Electronically Signed   By: Inez Catalina M.D.   On: 06/28/2016 08:40   Dg Si Joints  Result Date: 06/28/2016 CLINICAL DATA:  Chronic pain.  No reported injury . EXAM: BILATERAL SACROILIAC JOINTS - 3+ VIEW COMPARISON:  CT 06/01/2016.  MRI 02/08/2016 FINDINGS: Lumbosacral spine fusion. Degenerative changes lumbar spine and both SI joints. Degenerative changes both hips. Osteitis pubis again noted. No acute abnormality identified. Pelvic calcifications consistent phleboliths noted. IMPRESSION: Lumbosacral spine fusion. Degenerative changes lumbar spine, both SI joints, both hips. Osteitis pubis again noted. No acute abnormality identified. Electronically Signed   By: Marcello Moores  Register  On: 06/28/2016 06:53   Dg Shoulder Right  Result Date: 06/28/2016 CLINICAL DATA:  Chronic pain.  No recent prior. EXAM: RIGHT SHOULDER - 2+ VIEW COMPARISON:  01/05/2016. FINDINGS: Acromioclavicular glenohumeral degenerative change. Subacromial spurring is noted. No evidence of fracture or dislocation. IMPRESSION: 1.  No acute abnormality. 2. Degenerative changes right shoulder with subacromial spurring. Electronically Signed   By: Marcello Moores  Register   On: 06/28/2016 06:47   Dg Knee 1-2 Views Left  Result Date: 06/28/2016 CLINICAL DATA:  Knee pain common no known injury, initial encounter EXAM: LEFT KNEE - 1-2 VIEW COMPARISON:  None. FINDINGS: No acute fracture or dislocation is noted. No joint effusion is seen. Very minimal medial joint space narrowing is noted. IMPRESSION: Minimal degenerative change without acute abnormality. Electronically Signed   By: Inez Catalina M.D.   On: 06/28/2016 08:38   Dg Knee 1-2 Views Right  Result Date: 06/28/2016 CLINICAL DATA:  Right knee pain, no known injury, initial  encounter EXAM: RIGHT KNEE - 1-2 VIEW COMPARISON:  None. FINDINGS: No evidence of fracture, dislocation, or joint effusion. Mild medial joint space narrowing is noted. No soft tissue abnormality is seen. IMPRESSION: Minimal degenerative changes. Electronically Signed   By: Inez Catalina M.D.   On: 06/28/2016 08:39   Ct Angio Chest Pe W Or Wo Contrast  Result Date: 07/07/2016 CLINICAL DATA:  Exploratory laparotomy yesterday. Shortness of breath. Evaluate for pulmonary embolus. EXAM: CT ANGIOGRAPHY CHEST WITH CONTRAST TECHNIQUE: Multidetector CT imaging of the chest was performed using the standard protocol during bolus administration of intravenous contrast. Multiplanar CT image reconstructions and MIPs were obtained to evaluate the vascular anatomy. CONTRAST:  100 cc Isovue 370 COMPARISON:  None. FINDINGS: Cardiovascular: Heart size upper normal. No pericardial effusion. No thoracic aortic aneurysm. Assessment of segmental and subsegmental pulmonary arteries to the lower lobes is degraded by respiratory motion, but within this limitation there is no CT evidence for an acute pulmonary embolus. Mediastinum/Nodes: No mediastinal lymphadenopathy. There is no hilar lymphadenopathy. There is no axillary lymphadenopathy. NG tube passes into the stomach with nonvisualization of the distal tip. Lungs/Pleura: Bilateral lower lobe subsegmental atelectasis noted. Focal bandlike opacity in the lingula likely also atelectatic. 7 mm ground-glass nodule identified right lower lobe (image 59 series 5). No evidence for pneumothorax. No pleural effusion. Upper Abdomen: Stomach is fluid distended despite the presence of an NG tube. Musculoskeletal: Bone windows reveal no worrisome lytic or sclerotic osseous lesions. Review of the MIP images confirms the above findings. IMPRESSION: 1. No CT evidence for acute pulmonary embolus. 2. Bilateral lower lobe subsegmental atelectasis with probable lingular atelectasis associated. 3. Stomach  is fluid distended despite the presence of an NG tube. Electronically Signed   By: Misty Stanley M.D.   On: 07/07/2016 14:31   Ct Abdomen Pelvis W Contrast  Result Date: 07/05/2016 CLINICAL DATA:  70 year old female with recently diagnosed small bowel obstruction and pancreatitis. Continued abdominal pelvic pain with nausea. EXAM: CT ABDOMEN AND PELVIS WITH CONTRAST TECHNIQUE: Multidetector CT imaging of the abdomen and pelvis was performed using the standard protocol following bolus administration of intravenous contrast. CONTRAST:  100 cc intravenous Isovue 300 COMPARISON:  07/02/2016 and prior CTs. FINDINGS: Lower chest: Increased moderate bibasilar atelectasis identified. Hepatobiliary: The liver is unremarkable. The patient is status post cholecystectomy. There is no evidence of biliary dilatation. Pancreas: Unremarkable Spleen: Unremarkable Adrenals/Urinary Tract: The kidneys, adrenal glands and bladder are unremarkable except for unchanged 1.7 cm left adrenal adenoma. Stomach/Bowel: An NG tube is present  with tip in the mid stomach. Dilated proximal-mid small bowel loops are again identified with collapsed distal small bowel loops, compatible with small bowel obstruction. There has been interval decreased caliber of stomach and dilated small bowel loops since the prior study. There is no evidence of definite bowel wall thickening or pneumoperitoneum. Vascular/Lymphatic: Abdominal aortic atherosclerotic calcifications noted without aneurysm. No enlarged lymph nodes identified. Reproductive: Patient is status post hysterectomy. No adnexal masses identified. Other: No free fluid or focal collection/abscess. Musculoskeletal: Surgical changes within the lumbar spine again identified. No acute or suspicious abnormality. IMPRESSION: Small bowel obstruction again noted, with decompression of stomach and decreased caliber of dilated small bowel loops from NG tube within the stomach. No evidence of abscess, free  fluid or pneumoperitoneum. Increased moderate bibasilar atelectasis. Abdominal aortic atherosclerosis. Electronically Signed   By: Margarette Canada M.D.   On: 07/05/2016 13:26   Ct Abdomen Pelvis W Contrast  Result Date: 07/02/2016 CLINICAL DATA:  Epigastric abdominal pain for 5 days. Vomiting starting last night. Prior hysterectomy. Prior abdominal adhesion removal. EXAM: CT ABDOMEN AND PELVIS WITH CONTRAST TECHNIQUE: Multidetector CT imaging of the abdomen and pelvis was performed using the standard protocol following bolus administration of intravenous contrast. CONTRAST:  165mL ISOVUE-300 IOPAMIDOL (ISOVUE-300) INJECTION 61% COMPARISON:  01/30/2016 FINDINGS: Lower chest: Bibasilar atelectasis. Normal heart size without pericardial or pleural effusion. Fluid in the distal esophagus. Hepatobiliary: Normal liver. Cholecystectomy, without biliary ductal dilatation. Pancreas: Normal, without mass or ductal dilatation. Spleen: Normal in size, without focal abnormality. Adrenals/Urinary Tract: Normal right adrenal gland. Left adrenal nodule measures 1.7 cm and is been similar in back to 06/23/2015, consistent with an adenoma on that study. Normal kidneys, without hydronephrosis. Normal urinary bladder. Stomach/Bowel: The stomach is distended and fluid-filled. The colon is normal to minimally decompressed distally. Decompressed terminal and distal ileum. Proximal and mid small bowel loops are dilated, including at 3.7 cm on image 56/series 2. Relatively gradual transition occurs, including within left paracentral anteriorly positioned small bowel loops on image 52/series 2. No obstructive mass. No findings to suggest complicating ischemia. Minimal nonspecific mesenteric interloop edema is seen including on image 56/series 2. Vascular/Lymphatic: Advanced aortic and branch vessel atherosclerosis. No abdominopelvic adenopathy. Reproductive: Hysterectomy.  No adnexal mass. Other: No significant free fluid. No pneumatosis or  free intraperitoneal air. Musculoskeletal: Lumbosacral spine fixation. IMPRESSION: 1. Mid small bowel obstruction, likely secondary to adhesions. No complicating ischemia. 2. Distended, fluid-filled stomach with fluid in the esophagus. The patient would likely benefit from nasogastric tube placement. 3. Advanced aortic atherosclerosis. 4. Left adrenal adenoma. Electronically Signed   By: Abigail Miyamoto M.D.   On: 07/02/2016 20:44   Dg Chest Port 1 View  Result Date: 07/10/2016 CLINICAL DATA:  Shortness of breath, hypertension, asthma EXAM: PORTABLE CHEST 1 VIEW COMPARISON:  07/07/2016 FINDINGS: Cardiomediastinal silhouette is stable. Mild basilar atelectasis. No pulmonary edema. NG tube in place. Linear atelectasis or infiltrate in lingula. IMPRESSION: No pulmonary edema. Mild basilar atelectasis. Linear atelectasis or infiltrate in lingula. Electronically Signed   By: Lahoma Crocker M.D.   On: 07/10/2016 11:07   Dg Chest Port 1 View  Result Date: 07/05/2016 CLINICAL DATA:  Chest pain EXAM: PORTABLE CHEST 1 VIEW COMPARISON:  07/02/2016 FINDINGS: Cardiomediastinal silhouette is stable. Stable atelectasis or scarring right base. There is streaky atelectasis or infiltrate left base. No pulmonary edema. NG tube poorly visualized due to patient's large body habitus. IMPRESSION: No pulmonary edema. Right base atelectasis or scarring. Streaky left basilar atelectasis or infiltrate. Electronically  Signed   By: Lahoma Crocker M.D.   On: 07/05/2016 12:28   Dg Shoulder Left  Result Date: 06/28/2016 CLINICAL DATA:  Chronic pain.  No reported injury. EXAM: LEFT SHOULDER - 2+ VIEW COMPARISON:  08/16/2009. FINDINGS: No acute bony or joint abnormality identified. No evidence of fracture or dislocation. Mild acromioclavicular and glenohumeral degenerative change. IMPRESSION: No acute or focal abnormality. Mild degenerative changes left shoulder . Electronically Signed   By: Marcello Moores  Register   On: 06/28/2016 06:47   Dg Abd  Portable 1v  Result Date: 07/05/2016 CLINICAL DATA:  Small-bowel obstruction. EXAM: PORTABLE ABDOMEN - 1 VIEW COMPARISON:  07/04/2016 FINDINGS: NG tube in the stomach. Stomach is decompressed. No dilated loops of bowel identified. No bowel wall edema. Lumbar fusion hardware unchanged. IMPRESSION: Nonobstructive bowel gas pattern.  NG tube remains in the stomach. Electronically Signed   By: Franchot Gallo M.D.   On: 07/05/2016 08:43   Dg Abd Portable 1v-small Bowel Obstruction Protocol-24 Hr Delay  Result Date: 07/04/2016 CLINICAL DATA:  Delayed image for assessment of small bowel obstruction. EXAM: PORTABLE ABDOMEN - 1 VIEW COMPARISON:  07/03/2016 FINDINGS: Contrast injected into the stomach under previous exam is no longer visualized. It has passed. There is no evidence of small-bowel obstruction. Nasogastric tube is well positioned within the mid stomach. Bowel gas pattern is unremarkable. IMPRESSION: 1. No evidence of a small-bowel obstruction. Electronically Signed   By: Lajean Manes M.D.   On: 07/04/2016 09:15   Dg Abd Portable 1v-small Bowel Obstruction Protocol-initial, 8 Hr Delay  Result Date: 07/03/2016 CLINICAL DATA:  70 year old female with small bowel obstruction. Subsequent encounter. EXAM: PORTABLE ABDOMEN - 1 VIEW COMPARISON:  07/02/2016 plain film and CT. FINDINGS: Nasogastric tube with tip gastric body level. Contrast and gas-filled dilated stomach. Gas-filled small bowel loops. The caliber of the small bowel loops is not adequately assessed by plain film examination (as small bowel loops may be more fluid-filled than gas-filled when compared to prior exam) and small bowel obstructive pattern may still be present. The possibility of free intraperitoneal air cannot be assessed on a supine view. Postsurgical changes lumbar spine. IMPRESSION: Contrast and gas-filled dilated stomach. Gas-filled small bowel loops. The caliber of the small bowel loops is not adequately assessed by plain film  examination (as small bowel loops may be more fluid-filled than gas-filled when compared to prior exam) and small bowel obstructive pattern may still be present. Electronically Signed   By: Genia Del M.D.   On: 07/03/2016 15:51   Dg Abd Portable 1 View  Result Date: 07/02/2016 CLINICAL DATA:  NG tube placement. EXAM: PORTABLE ABDOMEN - 1 VIEW COMPARISON:  CT abdomen/ pelvis earlier this day FINDINGS: Tip and side port of the enteric tube below the diaphragm in the stomach. Gaseous gastric distention. Small bowel dilatation is partially included. Bibasilar atelectasis is seen. IMPRESSION: Tip and side port of the enteric tube below the diaphragm in the stomach. Electronically Signed   By: Jeb Levering M.D.   On: 07/02/2016 23:25   Dg Hip Unilat W Or W/o Pelvis 2-3 Views Left  Result Date: 06/28/2016 CLINICAL DATA:  Chronic pain. EXAM: DG HIP (WITH OR WITHOUT PELVIS) 2-3V LEFT COMPARISON:  05/22/2016.  MRI 02/08/2016. FINDINGS: Prior lumbosacral spine fusion. Degenerative changes lumbar spine, both SI joints, both hips. Osteitis pubis again noted. IMPRESSION: No acute abnormality. Prior lumbosacral spine fusion. Degenerative changes both SI joints and hips. Osteitis pubis again noted. Electronically Signed   By: Marcello Moores  Register  On: 06/28/2016 06:50   Dg Hip Unilat W Or W/o Pelvis 2-3 Views Right  Result Date: 06/28/2016 CLINICAL DATA:  Chronic pain.  No reported injury. EXAM: DG HIP (WITH OR WITHOUT PELVIS) 2-3V RIGHT COMPARISON:  CT 06/01/2016.  MRI 02/08/2016. FINDINGS: Lumbosacral spine fusion. Degenerative changes lumbar spine, both SI joints, both hips. Osteitis pubis again noted. No acute abnormality. No evidence of fracture dislocation. IMPRESSION: Lumbosacral spine fusion. Degenerative changes lumbar spine, both SI joints, both hips. Osteitis pubis again noted. No acute bony abnormality identified . Electronically Signed   By: Marcello Moores  Register   On: 06/28/2016 06:51   US Abdomen  Limited Ruq  Result Date: 07/03/2016 CLINICAL DATA:  Elevated LFTs.  Initial encounter. EXAM: US ABDOMEN LIMITED - RIGHT UPPER QUADRANT COMPARISON:  CT of the abdomen and pelvis from 07/02/2016 FINDINGS: Gallbladder: Status post cholecystectomy.  No retained stones seen. Common bile duct: Diameter: 0.3 cm, within normal limits in caliber. Difficult to fully characterize due to overlying structures. Liver: No focal lesion identified. Within normal limits in parenchymal echogenicity. IMPRESSION: Status post cholecystectomy. No retained stones seen. Unremarkable ultrasound of the right upper quadrant. On further assessment of the recent CT of the abdomen and pelvis, there is soft tissue inflammation tracking about the pancreatic head and second segment of the duodenum. Given clinical concern, this could reflect mild acute pancreatitis. There is no evidence of devascularization or pseudocyst formation on the recent prior CT. Note that the pancreas is typically not well assessed on ultrasound. Electronically Signed   By: Garald Balding M.D.   On: 07/03/2016 03:58     LOS: 13 days   Oren Binet, MD  [facility] Pager:[phone]  If 7PM-7AM, please contact night-coverage [email or web address] Password [email or web address] 07/15/2016, 3:11 PM

## 2016-07-15 NOTE — Progress Notes (Signed)
9 Days Post-Op  Subjective: Complains of sore throat Mild abdominal pain Tolerating fulls  Objective: Vital signs in last 24 hours: Temp:  [97.7 F (36.5 C)-98.7 F (37.1 C)] 98.5 F (36.9 C) (10/22 0902) Pulse Rate:  [71-77] 71 (10/22 0902) Resp:  [16-18] 18 (10/22 0902) BP: (114-136)/(63-75) 124/73 (10/22 0902) SpO2:  [98 %-100 %] 99 % (10/22 0902) Weight:  [124.9 kg (275 lb 4.8 oz)] 124.9 kg (275 lb 4.8 oz) (10/21 2020) Last BM Date: 07/13/16  Intake/Output from previous day: 10/21 0701 - 10/22 0700 In: 600 [P.O.:600] Out: 525 [Urine:525] Intake/Output this shift: Total I/O In: 240 [P.O.:240] Out: 0   Exam: Appears comfortable Abdomen soft, non-distended  Lab Results:  No results for input(s): WBC, HGB, HCT, PLT in the last 72 hours. BMET  Recent Labs  07/13/16 0500  NA 139  K 3.6  CL 105  CO2 29  GLUCOSE 107*  BUN 12  CREATININE 0.57  CALCIUM 8.0*   PT/INR No results for input(s): LABPROT, INR in the last 72 hours. ABG No results for input(s): PHART, HCO3 in the last 72 hours.  Invalid input(s): PCO2, PO2  Studies/Results: No results found.  Anti-infectives: Anti-infectives    Start     Dose/Rate Route Frequency Ordered Stop   07/06/16 1600  ciprofloxacin (CIPRO) IVPB 400 mg     400 mg 200 mL/hr over 60 Minutes Intravenous To [facility]Stay [facility] 07/06/16 0956 07/06/16 1254      Assessment/Plan: s/p Procedure(s): LAPAROSCOPY DIAGNOSTIC EMERGENT TO OPEN (N/A) EXPLORATORY LAPAROTOMY LYSIS OF ADHESIONS FOR 3 HOURS (N/A)  Try soft diet Ambulate Hopefully home tomorrow  LOS: 13 days    BLACKMAN,DOUGLAS A 07/15/2016

## 2016-07-16 DIAGNOSIS — Y848 Other medical procedures as the cause of abnormal reaction of the patient, or of later complication, without mention of misadventure at the time of the procedure: Secondary | ICD-10-CM | POA: Diagnosis present

## 2016-07-16 DIAGNOSIS — G9340 Encephalopathy, unspecified: Secondary | ICD-10-CM | POA: Diagnosis present

## 2016-07-16 DIAGNOSIS — T814XXA Infection following a procedure, initial encounter: Secondary | ICD-10-CM | POA: Diagnosis not present

## 2016-07-16 DIAGNOSIS — H409 Unspecified glaucoma: Secondary | ICD-10-CM | POA: Diagnosis present

## 2016-07-16 DIAGNOSIS — K56609 Unspecified intestinal obstruction, unspecified as to partial versus complete obstruction: Secondary | ICD-10-CM | POA: Diagnosis not present

## 2016-07-16 DIAGNOSIS — D72828 Other elevated white blood cell count: Secondary | ICD-10-CM | POA: Diagnosis not present

## 2016-07-16 DIAGNOSIS — Z66 Do not resuscitate: Secondary | ICD-10-CM | POA: Diagnosis present

## 2016-07-16 DIAGNOSIS — G8921 Chronic pain due to trauma: Secondary | ICD-10-CM | POA: Diagnosis not present

## 2016-07-16 DIAGNOSIS — R5381 Other malaise: Secondary | ICD-10-CM | POA: Diagnosis not present

## 2016-07-16 DIAGNOSIS — F329 Major depressive disorder, single episode, unspecified: Secondary | ICD-10-CM | POA: Diagnosis not present

## 2016-07-16 DIAGNOSIS — L89322 Pressure ulcer of left buttock, stage 2: Secondary | ICD-10-CM | POA: Diagnosis not present

## 2016-07-16 DIAGNOSIS — Z86718 Personal history of other venous thrombosis and embolism: Secondary | ICD-10-CM | POA: Diagnosis not present

## 2016-07-16 DIAGNOSIS — Z806 Family history of leukemia: Secondary | ICD-10-CM | POA: Diagnosis not present

## 2016-07-16 DIAGNOSIS — K5901 Slow transit constipation: Secondary | ICD-10-CM | POA: Diagnosis not present

## 2016-07-16 DIAGNOSIS — R3911 Hesitancy of micturition: Secondary | ICD-10-CM | POA: Diagnosis not present

## 2016-07-16 DIAGNOSIS — Z841 Family history of disorders of kidney and ureter: Secondary | ICD-10-CM | POA: Diagnosis not present

## 2016-07-16 DIAGNOSIS — K56600 Partial intestinal obstruction, unspecified as to cause: Secondary | ICD-10-CM | POA: Diagnosis present

## 2016-07-16 DIAGNOSIS — R208 Other disturbances of skin sensation: Secondary | ICD-10-CM | POA: Diagnosis not present

## 2016-07-16 DIAGNOSIS — I739 Peripheral vascular disease, unspecified: Secondary | ICD-10-CM | POA: Diagnosis present

## 2016-07-16 DIAGNOSIS — R2681 Unsteadiness on feet: Secondary | ICD-10-CM | POA: Diagnosis not present

## 2016-07-16 DIAGNOSIS — J9601 Acute respiratory failure with hypoxia: Secondary | ICD-10-CM | POA: Diagnosis not present

## 2016-07-16 DIAGNOSIS — Z8249 Family history of ischemic heart disease and other diseases of the circulatory system: Secondary | ICD-10-CM | POA: Diagnosis not present

## 2016-07-16 DIAGNOSIS — L03311 Cellulitis of abdominal wall: Secondary | ICD-10-CM | POA: Diagnosis present

## 2016-07-16 DIAGNOSIS — K219 Gastro-esophageal reflux disease without esophagitis: Secondary | ICD-10-CM | POA: Diagnosis not present

## 2016-07-16 DIAGNOSIS — M797 Fibromyalgia: Secondary | ICD-10-CM | POA: Diagnosis present

## 2016-07-16 DIAGNOSIS — Z90710 Acquired absence of both cervix and uterus: Secondary | ICD-10-CM | POA: Diagnosis not present

## 2016-07-16 DIAGNOSIS — R1084 Generalized abdominal pain: Secondary | ICD-10-CM | POA: Diagnosis not present

## 2016-07-16 DIAGNOSIS — G8929 Other chronic pain: Secondary | ICD-10-CM | POA: Diagnosis not present

## 2016-07-16 DIAGNOSIS — D62 Acute posthemorrhagic anemia: Secondary | ICD-10-CM | POA: Diagnosis not present

## 2016-07-16 DIAGNOSIS — G629 Polyneuropathy, unspecified: Secondary | ICD-10-CM | POA: Diagnosis not present

## 2016-07-16 DIAGNOSIS — G4733 Obstructive sleep apnea (adult) (pediatric): Secondary | ICD-10-CM | POA: Diagnosis present

## 2016-07-16 DIAGNOSIS — M792 Neuralgia and neuritis, unspecified: Secondary | ICD-10-CM | POA: Diagnosis not present

## 2016-07-16 DIAGNOSIS — Z87891 Personal history of nicotine dependence: Secondary | ICD-10-CM | POA: Diagnosis not present

## 2016-07-16 DIAGNOSIS — Z48815 Encounter for surgical aftercare following surgery on the digestive system: Secondary | ICD-10-CM | POA: Diagnosis not present

## 2016-07-16 DIAGNOSIS — I10 Essential (primary) hypertension: Secondary | ICD-10-CM | POA: Diagnosis not present

## 2016-07-16 DIAGNOSIS — R278 Other lack of coordination: Secondary | ICD-10-CM | POA: Diagnosis not present

## 2016-07-16 DIAGNOSIS — M6281 Muscle weakness (generalized): Secondary | ICD-10-CM | POA: Diagnosis not present

## 2016-07-16 DIAGNOSIS — D649 Anemia, unspecified: Secondary | ICD-10-CM | POA: Diagnosis present

## 2016-07-16 DIAGNOSIS — R1312 Dysphagia, oropharyngeal phase: Secondary | ICD-10-CM | POA: Diagnosis not present

## 2016-07-16 DIAGNOSIS — F419 Anxiety disorder, unspecified: Secondary | ICD-10-CM | POA: Diagnosis present

## 2016-07-16 DIAGNOSIS — J45909 Unspecified asthma, uncomplicated: Secondary | ICD-10-CM | POA: Diagnosis present

## 2016-07-16 LAB — GLUCOSE, CAPILLARY
Glucose-Capillary: 90 mg/dL (ref 65–99)
Glucose-Capillary: 91 mg/dL (ref 65–99)
Glucose-Capillary: 96 mg/dL (ref 65–99)
Glucose-Capillary: 99 mg/dL (ref 65–99)

## 2016-07-16 MED ORDER — BOOST / RESOURCE BREEZE PO LIQD: 1.0000 | Freq: Two times a day (BID) | ORAL | 0 refills | Status: DC

## 2016-07-16 MED ORDER — SENNA-DOCUSATE SODIUM 8.6-50 MG PO TABS: 2.0000 | ORAL_TABLET | Freq: Every day | ORAL | Status: DC

## 2016-07-16 MED ORDER — METOPROLOL TARTRATE 50 MG PO TABS: 50.0000 mg | ORAL_TABLET | Freq: Two times a day (BID) | ORAL | Status: DC

## 2016-07-16 MED ORDER — ACETAMINOPHEN 500 MG PO TABS: 1000.0000 mg | ORAL_TABLET | Freq: Three times a day (TID) | ORAL | 0 refills | Status: DC | PRN

## 2016-07-16 MED ORDER — OXYCODONE HCL 5 MG PO TABS: 5.0000 mg | ORAL_TABLET | Freq: Four times a day (QID) | ORAL | 0 refills | Status: DC | PRN

## 2016-07-16 NOTE — Progress Notes (Signed)
Nutrition Follow-up  DOCUMENTATION CODES:   Morbid obesity  INTERVENTION:  Recommend continuation of nutritional supplementation at SNF to aid in caloric and protein needs especially if PO intake becomes poor.   NUTRITION DIAGNOSIS:   Inadequate oral intake related to altered GI function as evidenced by NPO status; diet advanced; po 25-75%; improved  GOAL:   Patient will meet greater than or equal to 90% of their needs; met  MONITOR:   PO intake, Supplement acceptance, Labs, Weight trends, Skin, I & O's  REASON FOR ASSESSMENT:   NPO/Clear Liquid Diet (8 days)    ASSESSMENT:   70 y.o. woman with a history of HTN, GERD, DVT (she is not on anticoagulation currently), and chronic pain who presents to the ED for evaluation of abdominal pain x 5 five days.   . She was also found to have an elevated lipase, CT scan showed small bowel obstruction. General surgery was consulted and patient was admitted to medicine service. Patient underwent exploratory laparoscopy on 10/13. TPN discontinued 10/20.  Pt has been tolerating her soft diet. Meal completion has been 50-70% today. Plans for discharge today to SNF. Pt currently has Boost Breeze and Ensure ordered. RD to modify nutritional supplement orders and discontinue Boost Breeze. Recommend continuation of nutritional supplementation at SNF to aid in caloric and protein needs if po intake becomes poor.   Diet Order:  DIET SOFT Room service appropriate? Yes; Fluid consistency: Thin Diet - low sodium heart healthy  Skin:  Wound (see comment) (Stage II on buttocks)  Last BM:  10/22  Height:   Ht Readings from Last 1 Encounters:  07/03/16 5' 5"  (1.651 m)    Weight:   Wt Readings from Last 1 Encounters:  07/15/16 275 lb 12.7 oz (125.1 kg)    Ideal Body Weight:  56.8 kg  BMI:  Body mass index is 45.89 kg/m.  Estimated Nutritional Needs:   Kcal:  6301-6010  Protein:  110-130 grams  Fluid:  1.9 - 2 L/day  EDUCATION NEEDS:    No education needs identified at this time  Corrin Parker, MS, RD, LDN Pager # [phone] After hours/ weekend pager # [phone]

## 2016-07-16 NOTE — Clinical Social Work Note (Signed)
CSW facilitated patient discharge including contacting patient family and facility to confirm patient discharge plans. Clinical information faxed to facility and family agreeable with plan. CSW arranged ambulance transport via [facility] to [facility] around 4:00. RN to call report prior to discharge [phone]).  CSW will sign off for now as social work intervention is no longer needed. Please consult [place] again if new needs arise.  Amy [name], [place]

## 2016-07-16 NOTE — Progress Notes (Signed)
PICC line was taken out by IV team.   Pt dressed and pt belongings gathered in bag. Applied 2 ABD pads to the insion site.   Scant amount of serosanguineous drainage from incision site. Cleansed and applies the dressings.   Pt understands that she will attend [facility] for rehab.   Gave report to nurse at [facility] place. Reported incision site., staples at the site and dressings used. Last BM 07/15/16.   Pt is alert and orientated x4. Pt is one assist and can walk with assistance. Pt is on Room air and no complications at this time.   Pt is adequate for discharge.   Paulla Fore, RN

## 2016-07-16 NOTE — Progress Notes (Signed)
Physical Therapy Treatment Patient Details Name: Amy [name] MRN: [id number] DOB: [birth year] Today's Date: 07/16/2016    History of Present Illness 70 y.o. female admitted to [facility] on 07/02/16 for abdominal pain, nausea and vomiting.  Pt found to have a SBO and underwent an exploratory laparotomy with post op wound vac on 07/06/16.  Pt with significant PMHx of DOE, PVD, neuropathy, HTN, h/o DVT, fibromyalgia, and chronic back pain (s/p multiple back surgeries).     PT Comments    Pt is hurting even after meds and PT asked if she could reduce tx for now to exercises to get some strengthening done.  Pt agreed, very motivated and pleasant with PT during session.  Pt is willing to try to walk later and hopefully will ask nursing to take a walk.  Will anticipate continuing on with PT sessions until pt is discharged to SNF.  Follow Up Recommendations  SNF     Equipment Recommendations  None recommended by PT    Recommendations for Other Services       Precautions / Restrictions Precautions Precautions: Fall Restrictions Weight Bearing Restrictions: No    Mobility  Bed Mobility Overal bed mobility: Modified Independent Bed Mobility: Rolling Rolling: Modified independent (Device/Increase time)   Supine to sit: Supervision Sit to supine: Min assist   General bed mobility comments: increased time and using rails  Transfers                 General transfer comment: declined to attempt  Ambulation/Gait             General Gait Details: declined   Stairs            Wheelchair Mobility    Modified Rankin (Stroke Patients Only)       Balance     Sitting balance-Leahy Scale: Good                              Cognition Arousal/Alertness: Awake/alert Behavior During Therapy: WFL for tasks assessed/performed Overall Cognitive Status: Within Functional Limits for tasks assessed                 General Comments: speech is  slowed down but not unable to focus    Exercises General Exercises - Lower Extremity Ankle Circles/Pumps: AROM;Both;10 reps Quad Sets: AROM;Both;15 reps Gluteal Sets: AROM;Both;15 reps Heel Slides: AROM;Both;15 reps Hip ABduction/ADduction: AROM;Both;15 reps    General Comments        Pertinent Vitals/Pain Pain Assessment: Faces Faces Pain Scale: Hurts even more Pain Location: back Pain Descriptors / Indicators: Sore Pain Intervention(s): Limited activity within patient's tolerance;Monitored during session;Premedicated before session;Repositioned    Home Living                      Prior Function            PT Goals (current goals can now be found in the care plan section) Progress towards PT goals: Progressing toward goals    Frequency    Min 3X/week      PT Plan Current plan remains appropriate    Co-evaluation             End of Session   Activity Tolerance: Patient limited by pain Patient left: in bed;with call bell/phone within reach;with bed alarm set     Time: [id number] PT Time Calculation (min) (ACUTE ONLY): 30 min  Charges:  $Therapeutic Exercise: 8-22  mins $Therapeutic Activity: 8-22 mins                    G Codes:      Ramond Dial 2016-08-14, 12:55 PM    Mee Hives, PT MS Acute Rehab Dept. Number: [place]

## 2016-07-16 NOTE — Progress Notes (Signed)
Occupational Therapy Treatment Patient Details Name: Amy [name] MRN: [id number] DOB: Oct 17, 1945 Today's Date: 07/16/2016    History of present illness 70 y.o. female admitted to [facility] on 07/02/16 for abdominal pain, nausea and vomiting.  Pt found to have a SBO and underwent an exploratory laparotomy with post op wound vac on 07/06/16.  Pt with significant PMHx of DOE, PVD, neuropathy, HTN, h/o DVT, fibromyalgia, and chronic back pain (s/p multiple back surgeries).    OT comments  Pt stating she anticipates discharge to SNF today. Performed toileting and grooming, pt declined bathing and dressing. More concerned with calling her daughter to bring clothing from home for d/c.   Follow Up Recommendations  SNF;Supervision/Assistance - 24 hour    Equipment Recommendations       Recommendations for Other Services      Precautions / Restrictions Precautions Precautions: Fall Restrictions Weight Bearing Restrictions: No       Mobility Bed Mobility Overal bed mobility: Needs Assistance Bed Mobility: Supine to Sit;Sit to Supine     Supine to sit: Supervision Sit to supine: Min assist   General bed mobility comments: increased time, HOB up and use of rail, assist for LEs back into bed  Transfers                      Balance     Sitting balance-Leahy Scale: Good                             ADL Overall ADL's : Needs assistance/impaired Eating/Feeding: Independent;Bed level Eating/Feeding Details (indicate cue type and reason): pt now eating full diet Grooming: Wash/dry hands;Wash/dry face;Sitting;Set up                   Toilet Transfer: Minimal assistance;Stand-pivot;BSC;RW;Requires wide/bariatric   Toileting- Clothing Manipulation and Hygiene: Minimal assistance;Sit to/from stand         General ADL Comments: Pt declined bathing and dressing at this time.       Vision                     Perception     Praxis       Cognition   Behavior During Therapy: [facility] for tasks assessed/performed Overall Cognitive Status: Within Functional Limits for tasks assessed                  General Comments: Pt moves slowly.     Extremity/Trunk Assessment               Exercises     Shoulder Instructions       General Comments      Pertinent Vitals/ Pain       Pain Assessment: Faces Faces Pain Scale: Hurts little more Pain Location: abdomen Pain Descriptors / Indicators: Sore Pain Intervention(s): Monitored during session  Home Living                                          Prior Functioning/Environment              Frequency  Min 2X/week        Progress Toward Goals  OT Goals(current goals can now be found in the care plan section)  Progress towards OT goals: Progressing toward goals  Acute Rehab OT Goals Time For Goal  Achievement: 07/25/16 Potential to Achieve Goals: Good  Plan Discharge plan remains appropriate    Co-evaluation                 End of Session Equipment Utilized During Treatment: Rolling walker   Activity Tolerance Patient tolerated treatment well   Patient Left in bed;with call bell/phone within reach   Nurse Communication          Time: [id number] OT Time Calculation (min): 16 min  Charges: OT General Charges $OT Visit: 1 Procedure OT Treatments $Self Care/Home Management : 8-22 mins  Malka So 07/16/2016, 9:29 AM  [phone]

## 2016-07-16 NOTE — Discharge Summary (Signed)
PATIENT DETAILS Name: Amy [name] Age: 70 y.o. Sex: female Date of Birth: 12-10-1945 MRN: [id number]. Admitting Physician: Lily Kocher, MD [id number], [facility], MD  Admit Date: 07/02/2016 Discharge date: 07/16/2016  Recommendations for Outpatient Follow-up:  1. Follow up with PCP in 1-2 weeks 2. Please obtain BMP/CBC in one week 3. Remove staples on postoperative day # 14 (on 10/27)  4. Left adrenal adenoma seen incidentally on CT scan of the abdomen-defer further workup to the outpatient setting 5. Ensure follow-up with general surgery.   Admitted From:  Home  Disposition: SNF    Home Health: No  Equipment/Devices: None  Discharge Condition: Stable  CODE STATUS: FULL CODE  Diet recommendation:  Heart Healthy  Brief Summary: See H&P, Labs, Consult and Test reports for all details in brief, Patient is a 70 y.o. female with past medical history of hypertension, GERD, DVT (not on anticoagulation), chronic pain syndrome who presented to ED with small bowel obstruction. She was initially managed with conservative measures, but subsequently underwent exploratory laparotomy and lysis of adhesions on 10/13  Brief Hospital Course: SBO (small bowel obstruction): Initially conservative measures attempted, however bowel obstruction continued, required exploratory laparotomy with lysis of adhesions. Postoperatively, placed on TNA., with return of bowel function-her diet has really been advanced to soft diet.  Postoperative care was managed by the general surgery service, recommendations are to remove staples on postoperative day 14-which will be around 10/27. Please ensure follow-up with general surgery   Active Problems: Hypokalemia: Repleted. Suspect secondary to GI loss from NG tube.  Sinus tachycardia: Resolved. Felt to be secondary to sympathetic response from anxiety/NG tube placement, TSH within normal limits, CT angiogram of the chest negative for pulmonary  embolism. 2-D echocardiogram with preserved ejection fraction.  Hypertension: Better controlled-continue clonidine,  Metoprolol,hydralazine. Follow and adjust accordingly in the outpatient setting  GERD: Continue PPI.  Elevated lipase levels: Probably secondary to bowel obstruction rather than pancreatitis. CT scan of the abdomen on 10/9 and 10/12  did not show any obvious pancreatitis.  Allergic rhinitis: Continue Flonase  Anxiety/depression: Anxiety levels are better controlled with clinical improvement-continue Celexa.  History of neuropathy/chronic pain syndrome: Continue gabapentin.  Left adrenal adenoma:seen incidentally on CT scan of the abdomen-defer further workup to the outpatient setting  Deconditioning: Secondary to acute illness/prolonged hospitalization, seen by physical therapy, recommendations are to discharge to SNF. Patient agreeable.  Procedures/Studies: LAPAROSCOPY DIAGNOSTIC EMERGENT TO OPEN EXPLORATORY LAPAROTOMY, LYSIS OF ADHESIONS - 07/06/2016  Discharge Diagnoses:  Principal Problem:   SBO (small bowel obstruction) Active Problems:   Essential hypertension   Allergic rhinitis   GERD (gastroesophageal reflux disease)   Chronic pain   Pancreatitis   Discharge Instructions:  Activity:  As tolerated with Full fall precautions use walker/cane & assistance as needed  Discharge Instructions    Call MD for:  redness, tenderness, or signs of infection (pain, swelling, redness, odor or green/yellow discharge around incision site)    Complete by:  As directed    Diet - low sodium heart healthy    Complete by:  As directed    Increase activity slowly    Complete by:  As directed        Medication List    STOP taking these medications   losartan-hydrochlorothiazide 100-25 MG tablet Commonly known as:  HYZAAR   NIFEdipine 10 MG capsule Commonly known as:  PROCARDIA   potassium chloride SA 20 MEQ tablet Commonly known as:  K-DUR,KLOR-CON       TAKE these  medications   acetaminophen 500 MG tablet Commonly known as:  TYLENOL Take 2 tablets (1,000 mg total) by mouth every 8 (eight) hours as needed (pain). What changed:  how much to take  when to take this   CALCIUM-VITAMIN D3 PO Take 1 tablet by mouth daily.   cloNIDine 0.1 MG tablet Commonly known as:  CATAPRES Take 0.1 mg by mouth 3 (three) times daily.   diphenhydrAMINE 25 mg capsule Commonly known as:  BENADRYL Take 25 mg by mouth 2 (two) times daily as needed for allergies.   estradiol 0.1 MG/GM vaginal cream Commonly known as:  ESTRACE Place 1 Applicatorful vaginally 2 (two) times a week. Tuesdays and Thursdays   feeding supplement Liqd Take 1 Container by mouth 2 (two) times daily between meals.   fluticasone 50 MCG/ACT nasal spray Commonly known as:  FLONASE Place 1 spray into both nostrils daily.   gabapentin 300 MG capsule Commonly known as:  NEURONTIN Take 300 mg by mouth 2 (two) times daily.   hydrALAZINE 25 MG tablet Commonly known as:  APRESOLINE Take 25 mg by mouth 3 (three) times daily.   ICY HOT EX Apply 1 patch topically daily as needed (pain). patches   ICY HOT EX Apply 1 application topically 2 (two) times daily as needed (pain). Gel   meloxicam 15 MG tablet Commonly known as:  MOBIC Take 15 mg by mouth daily.   METANX 3-90.314-2-35 MG Caps Take 1 capsule by mouth 2 (two) times daily.   metoprolol 50 MG tablet Commonly known as:  LOPRESSOR Take 1 tablet (50 mg total) by mouth 2 (two) times daily.   multivitamin with minerals Tabs tablet Take 1 tablet by mouth daily.   oxyCODONE 5 MG immediate release tablet Commonly known as:  Oxy IR/ROXICODONE Take 1-2 tablets (5-10 mg total) by mouth every 6 (six) hours as needed for moderate pain.   pantoprazole 40 MG tablet Commonly known as:  PROTONIX Take 40 mg by mouth daily.   PARoxetine 40 MG tablet Commonly known as:  PAXIL Take 40 mg by mouth daily. Reported on 01/18/2016    polyethylene glycol packet Commonly known as:  MIRALAX / GLYCOLAX Take 17 g by mouth daily as needed (constipation).   sennosides-docusate sodium 8.6-50 MG tablet Commonly known as:  SENOKOT-S Take 2 tablets by mouth daily. What changed:  when to take this  reasons to take this   silodosin 8 MG Caps capsule Commonly known as:  RAPAFLO Take 8 mg by mouth daily as needed (difficulty urinating).   VITAMIN B COMPLEX PO Take 1 tablet by mouth daily.   vitamin E 400 UNIT capsule Take 400 Units by mouth daily.      Follow-up Information    Gayland Curry, MD .   Specialty:  General Surgery Contact information: [address] [place] [address] [id number]        Blanchie Serve, MD. Schedule an appointment as soon as possible for a visit in 1 week(s).   Specialty:  Internal Medicine Contact information: [address] [place] [address] [phone]          Allergies  Allergen Reactions  . Latex Other (See Comments)    Sores, blisters - reaction to gloves  . Penicillins Other (See Comments)    Pt was told she was allergic to penicillin in her mid 20'2 - does not recall the reaction. She has taken amoxicillin and ampicillin with no reaction.     Consultations:  general surgery    Other Procedures/Studies: Dg Chest 2 View  Result Date: 07/02/2016 CLINICAL DATA:  Mid lower chest and left sided chest pain up into left shoulder. Severe abd pain and tightness. Dizziness. N/V. SOB. Rapid heartbeat. Skin feels cold - almost clammy, but she states she is burning up. Symptoms have increased in severity and frequency over the last week. EXAM: CHEST  2 VIEW COMPARISON:  01/05/2016 FINDINGS: Lung volumes are low. There is linear/streaky type opacity at the lung bases there is most likely atelectasis. Infection is possible. Remainder of the lungs is clear. No pleural effusion or pneumothorax. Cardiac silhouette is normal in size. No mediastinal or hilar  masses. No evidence of adenopathy. Skeletal structures are intact. IMPRESSION: 1. Lung base opacity accentuated by low lung volumes. This is most likely atelectasis. Consider pneumonia if there are consistent clinical symptoms. 2. No other evidence of acute cardiopulmonary disease. Electronically Signed   By: Lajean Manes M.D.   On: 07/02/2016 16:38   Dg Cervical Spine Complete  Result Date: 06/28/2016 CLINICAL DATA:  Chronic neck pain, no known injury, initial encounter EXAM: CERVICAL SPINE - COMPLETE 4+ VIEW COMPARISON:  None. FINDINGS: Seven cervical segments are well visualized. Vertebral body height is well maintained. No prevertebral soft tissue changes are seen. Mild facet hypertrophic changes are noted. No significant neural foraminal narrowing is seen. The odontoid is within normal limits. IMPRESSION: Mild degenerative change without acute abnormality. Electronically Signed   By: Inez Catalina M.D.   On: 06/28/2016 08:38   Dg Lumbar Spine Complete W/bend  Result Date: 06/28/2016 CLINICAL DATA:  Low back pain, no known injury, initial encounter EXAM: LUMBAR SPINE - COMPLETE WITH BENDING VIEWS COMPARISON:  05/22/2016 FINDINGS: Multilevel postoperative changes are noted throughout the lumbar spine. The overall appearance of the hardware is stable. Vertebral body height is well maintained. Aortic calcifications are seen. Flexion and extension views show no definitive instability. IMPRESSION: Multilevel postoperative changes. No instability is seen on flexion and extension. No acute abnormality is noted. Electronically Signed   By: Inez Catalina M.D.   On: 06/28/2016 08:40   Dg Si Joints  Result Date: 06/28/2016 CLINICAL DATA:  Chronic pain.  No reported injury . EXAM: BILATERAL SACROILIAC JOINTS - 3+ VIEW COMPARISON:  CT 06/01/2016.  MRI 02/08/2016 FINDINGS: Lumbosacral spine fusion. Degenerative changes lumbar spine and both SI joints. Degenerative changes both hips. Osteitis pubis again noted. No  acute abnormality identified. Pelvic calcifications consistent phleboliths noted. IMPRESSION: Lumbosacral spine fusion. Degenerative changes lumbar spine, both SI joints, both hips. Osteitis pubis again noted. No acute abnormality identified. Electronically Signed   By: Marcello Moores  Register   On: 06/28/2016 06:53   Dg Shoulder Right  Result Date: 06/28/2016 CLINICAL DATA:  Chronic pain.  No recent prior. EXAM: RIGHT SHOULDER - 2+ VIEW COMPARISON:  01/05/2016. FINDINGS: Acromioclavicular glenohumeral degenerative change. Subacromial spurring is noted. No evidence of fracture or dislocation. IMPRESSION: 1.  No acute abnormality. 2. Degenerative changes right shoulder with subacromial spurring. Electronically Signed   By: Marcello Moores  Register   On: 06/28/2016 06:47   Dg Knee 1-2 Views Left  Result Date: 06/28/2016 CLINICAL DATA:  Knee pain common no known injury, initial encounter EXAM: LEFT KNEE - 1-2 VIEW COMPARISON:  None. FINDINGS: No acute fracture or dislocation is noted. No joint effusion is seen. Very minimal medial joint space narrowing is noted. IMPRESSION: Minimal degenerative change without acute abnormality. Electronically Signed   By: Inez Catalina M.D.   On: 06/28/2016 08:38  Dg Knee 1-2 Views Right  Result Date: 06/28/2016 CLINICAL DATA:  Right knee pain, no known injury, initial encounter EXAM: RIGHT KNEE - 1-2 VIEW COMPARISON:  None. FINDINGS: No evidence of fracture, dislocation, or joint effusion. Mild medial joint space narrowing is noted. No soft tissue abnormality is seen. IMPRESSION: Minimal degenerative changes. Electronically Signed   By: Inez Catalina M.D.   On: 06/28/2016 08:39   Ct Angio Chest Pe W Or Wo Contrast  Result Date: 07/07/2016 CLINICAL DATA:  Exploratory laparotomy yesterday. Shortness of breath. Evaluate for pulmonary embolus. EXAM: CT ANGIOGRAPHY CHEST WITH CONTRAST TECHNIQUE: Multidetector CT imaging of the chest was performed using the standard protocol during bolus  administration of intravenous contrast. Multiplanar CT image reconstructions and MIPs were obtained to evaluate the vascular anatomy. CONTRAST:  100 cc Isovue 370 COMPARISON:  None. FINDINGS: Cardiovascular: Heart size upper normal. No pericardial effusion. No thoracic aortic aneurysm. Assessment of segmental and subsegmental pulmonary arteries to the lower lobes is degraded by respiratory motion, but within this limitation there is no CT evidence for an acute pulmonary embolus. Mediastinum/Nodes: No mediastinal lymphadenopathy. There is no hilar lymphadenopathy. There is no axillary lymphadenopathy. NG tube passes into the stomach with nonvisualization of the distal tip. Lungs/Pleura: Bilateral lower lobe subsegmental atelectasis noted. Focal bandlike opacity in the lingula likely also atelectatic. 7 mm ground-glass nodule identified right lower lobe (image 59 series 5). No evidence for pneumothorax. No pleural effusion. Upper Abdomen: Stomach is fluid distended despite the presence of an NG tube. Musculoskeletal: Bone windows reveal no worrisome lytic or sclerotic osseous lesions. Review of the MIP images confirms the above findings. IMPRESSION: 1. No CT evidence for acute pulmonary embolus. 2. Bilateral lower lobe subsegmental atelectasis with probable lingular atelectasis associated. 3. Stomach is fluid distended despite the presence of an NG tube. Electronically Signed   By: Misty Stanley M.D.   On: 07/07/2016 14:31   Ct Abdomen Pelvis W Contrast  Result Date: 07/05/2016 CLINICAL DATA:  70 year old female with recently diagnosed small bowel obstruction and pancreatitis. Continued abdominal pelvic pain with nausea. EXAM: CT ABDOMEN AND PELVIS WITH CONTRAST TECHNIQUE: Multidetector CT imaging of the abdomen and pelvis was performed using the standard protocol following bolus administration of intravenous contrast. CONTRAST:  100 cc intravenous Isovue 300 COMPARISON:  07/02/2016 and prior CTs. FINDINGS: Lower  chest: Increased moderate bibasilar atelectasis identified. Hepatobiliary: The liver is unremarkable. The patient is status post cholecystectomy. There is no evidence of biliary dilatation. Pancreas: Unremarkable Spleen: Unremarkable Adrenals/Urinary Tract: The kidneys, adrenal glands and bladder are unremarkable except for unchanged 1.7 cm left adrenal adenoma. Stomach/Bowel: An NG tube is present with tip in the mid stomach. Dilated proximal-mid small bowel loops are again identified with collapsed distal small bowel loops, compatible with small bowel obstruction. There has been interval decreased caliber of stomach and dilated small bowel loops since the prior study. There is no evidence of definite bowel wall thickening or pneumoperitoneum. Vascular/Lymphatic: Abdominal aortic atherosclerotic calcifications noted without aneurysm. No enlarged lymph nodes identified. Reproductive: Patient is status post hysterectomy. No adnexal masses identified. Other: No free fluid or focal collection/abscess. Musculoskeletal: Surgical changes within the lumbar spine again identified. No acute or suspicious abnormality. IMPRESSION: Small bowel obstruction again noted, with decompression of stomach and decreased caliber of dilated small bowel loops from NG tube within the stomach. No evidence of abscess, free fluid or pneumoperitoneum. Increased moderate bibasilar atelectasis. Abdominal aortic atherosclerosis. Electronically Signed   By: Cleatis Polka.D.  On: 07/05/2016 13:26   Ct Abdomen Pelvis W Contrast  Result Date: 07/02/2016 CLINICAL DATA:  Epigastric abdominal pain for 5 days. Vomiting starting last night. Prior hysterectomy. Prior abdominal adhesion removal. EXAM: CT ABDOMEN AND PELVIS WITH CONTRAST TECHNIQUE: Multidetector CT imaging of the abdomen and pelvis was performed using the standard protocol following bolus administration of intravenous contrast. CONTRAST:  148mL ISOVUE-300 IOPAMIDOL (ISOVUE-300) INJECTION  61% COMPARISON:  01/30/2016 FINDINGS: Lower chest: Bibasilar atelectasis. Normal heart size without pericardial or pleural effusion. Fluid in the distal esophagus. Hepatobiliary: Normal liver. Cholecystectomy, without biliary ductal dilatation. Pancreas: Normal, without mass or ductal dilatation. Spleen: Normal in size, without focal abnormality. Adrenals/Urinary Tract: Normal right adrenal gland. Left adrenal nodule measures 1.7 cm and is been similar in back to 06/23/2015, consistent with an adenoma on that study. Normal kidneys, without hydronephrosis. Normal urinary bladder. Stomach/Bowel: The stomach is distended and fluid-filled. The colon is normal to minimally decompressed distally. Decompressed terminal and distal ileum. Proximal and mid small bowel loops are dilated, including at 3.7 cm on image 56/series 2. Relatively gradual transition occurs, including within left paracentral anteriorly positioned small bowel loops on image 52/series 2. No obstructive mass. No findings to suggest complicating ischemia. Minimal nonspecific mesenteric interloop edema is seen including on image 56/series 2. Vascular/Lymphatic: Advanced aortic and branch vessel atherosclerosis. No abdominopelvic adenopathy. Reproductive: Hysterectomy.  No adnexal mass. Other: No significant free fluid. No pneumatosis or free intraperitoneal air. Musculoskeletal: Lumbosacral spine fixation. IMPRESSION: 1. Mid small bowel obstruction, likely secondary to adhesions. No complicating ischemia. 2. Distended, fluid-filled stomach with fluid in the esophagus. The patient would likely benefit from nasogastric tube placement. 3. Advanced aortic atherosclerosis. 4. Left adrenal adenoma. Electronically Signed   By: Abigail Miyamoto M.D.   On: 07/02/2016 20:44   Dg Chest Port 1 View  Result Date: 07/10/2016 CLINICAL DATA:  Shortness of breath, hypertension, asthma EXAM: PORTABLE CHEST 1 VIEW COMPARISON:  07/07/2016 FINDINGS: Cardiomediastinal  silhouette is stable. Mild basilar atelectasis. No pulmonary edema. NG tube in place. Linear atelectasis or infiltrate in lingula. IMPRESSION: No pulmonary edema. Mild basilar atelectasis. Linear atelectasis or infiltrate in lingula. Electronically Signed   By: Lahoma Crocker M.D.   On: 07/10/2016 11:07   Dg Chest Port 1 View  Result Date: 07/05/2016 CLINICAL DATA:  Chest pain EXAM: PORTABLE CHEST 1 VIEW COMPARISON:  07/02/2016 FINDINGS: Cardiomediastinal silhouette is stable. Stable atelectasis or scarring right base. There is streaky atelectasis or infiltrate left base. No pulmonary edema. NG tube poorly visualized due to patient's large body habitus. IMPRESSION: No pulmonary edema. Right base atelectasis or scarring. Streaky left basilar atelectasis or infiltrate. Electronically Signed   By: Lahoma Crocker M.D.   On: 07/05/2016 12:28   Dg Shoulder Left  Result Date: 06/28/2016 CLINICAL DATA:  Chronic pain.  No reported injury. EXAM: LEFT SHOULDER - 2+ VIEW COMPARISON:  08/16/2009. FINDINGS: No acute bony or joint abnormality identified. No evidence of fracture or dislocation. Mild acromioclavicular and glenohumeral degenerative change. IMPRESSION: No acute or focal abnormality. Mild degenerative changes left shoulder . Electronically Signed   By: Marcello Moores  Register   On: 06/28/2016 06:47   Dg Abd Portable 1v  Result Date: 07/05/2016 CLINICAL DATA:  Small-bowel obstruction. EXAM: PORTABLE ABDOMEN - 1 VIEW COMPARISON:  07/04/2016 FINDINGS: NG tube in the stomach. Stomach is decompressed. No dilated loops of bowel identified. No bowel wall edema. Lumbar fusion hardware unchanged. IMPRESSION: Nonobstructive bowel gas pattern.  NG tube remains in the stomach. Electronically Signed  By: Franchot Gallo M.D.   On: 07/05/2016 08:43   Dg Abd Portable 1v-small Bowel Obstruction Protocol-24 Hr Delay  Result Date: 07/04/2016 CLINICAL DATA:  Delayed image for assessment of small bowel obstruction. EXAM: PORTABLE  ABDOMEN - 1 VIEW COMPARISON:  07/03/2016 FINDINGS: Contrast injected into the stomach under previous exam is no longer visualized. It has passed. There is no evidence of small-bowel obstruction. Nasogastric tube is well positioned within the mid stomach. Bowel gas pattern is unremarkable. IMPRESSION: 1. No evidence of a small-bowel obstruction. Electronically Signed   By: Lajean Manes M.D.   On: 07/04/2016 09:15   Dg Abd Portable 1v-small Bowel Obstruction Protocol-initial, 8 Hr Delay  Result Date: 07/03/2016 CLINICAL DATA:  70 year old female with small bowel obstruction. Subsequent encounter. EXAM: PORTABLE ABDOMEN - 1 VIEW COMPARISON:  07/02/2016 plain film and CT. FINDINGS: Nasogastric tube with tip gastric body level. Contrast and gas-filled dilated stomach. Gas-filled small bowel loops. The caliber of the small bowel loops is not adequately assessed by plain film examination (as small bowel loops may be more fluid-filled than gas-filled when compared to prior exam) and small bowel obstructive pattern may still be present. The possibility of free intraperitoneal air cannot be assessed on a supine view. Postsurgical changes lumbar spine. IMPRESSION: Contrast and gas-filled dilated stomach. Gas-filled small bowel loops. The caliber of the small bowel loops is not adequately assessed by plain film examination (as small bowel loops may be more fluid-filled than gas-filled when compared to prior exam) and small bowel obstructive pattern may still be present. Electronically Signed   By: Genia Del M.D.   On: 07/03/2016 15:51   Dg Abd Portable 1 View  Result Date: 07/02/2016 CLINICAL DATA:  NG tube placement. EXAM: PORTABLE ABDOMEN - 1 VIEW COMPARISON:  CT abdomen/ pelvis earlier this day FINDINGS: Tip and side port of the enteric tube below the diaphragm in the stomach. Gaseous gastric distention. Small bowel dilatation is partially included. Bibasilar atelectasis is seen. IMPRESSION: Tip and side port  of the enteric tube below the diaphragm in the stomach. Electronically Signed   By: Jeb Levering M.D.   On: 07/02/2016 23:25   Dg Hip Unilat W Or W/o Pelvis 2-3 Views Left  Result Date: 06/28/2016 CLINICAL DATA:  Chronic pain. EXAM: DG HIP (WITH OR WITHOUT PELVIS) 2-3V LEFT COMPARISON:  05/22/2016.  MRI 02/08/2016. FINDINGS: Prior lumbosacral spine fusion. Degenerative changes lumbar spine, both SI joints, both hips. Osteitis pubis again noted. IMPRESSION: No acute abnormality. Prior lumbosacral spine fusion. Degenerative changes both SI joints and hips. Osteitis pubis again noted. Electronically Signed   By: Marcello Moores  Register   On: 06/28/2016 06:50   Dg Hip Unilat W Or W/o Pelvis 2-3 Views Right  Result Date: 06/28/2016 CLINICAL DATA:  Chronic pain.  No reported injury. EXAM: DG HIP (WITH OR WITHOUT PELVIS) 2-3V RIGHT COMPARISON:  CT 06/01/2016.  MRI 02/08/2016. FINDINGS: Lumbosacral spine fusion. Degenerative changes lumbar spine, both SI joints, both hips. Osteitis pubis again noted. No acute abnormality. No evidence of fracture dislocation. IMPRESSION: Lumbosacral spine fusion. Degenerative changes lumbar spine, both SI joints, both hips. Osteitis pubis again noted. No acute bony abnormality identified . Electronically Signed   By: Marcello Moores  Register   On: 06/28/2016 06:51   US Abdomen Limited Ruq  Result Date: 07/03/2016 CLINICAL DATA:  Elevated LFTs.  Initial encounter. EXAM: US ABDOMEN LIMITED - RIGHT UPPER QUADRANT COMPARISON:  CT of the abdomen and pelvis from 07/02/2016 FINDINGS: Gallbladder: Status post cholecystectomy.  No retained stones seen. Common bile duct: Diameter: 0.3 cm, within normal limits in caliber. Difficult to fully characterize due to overlying structures. Liver: No focal lesion identified. Within normal limits in parenchymal echogenicity. IMPRESSION: Status post cholecystectomy. No retained stones seen. Unremarkable ultrasound of the right upper quadrant. On further  assessment of the recent CT of the abdomen and pelvis, there is soft tissue inflammation tracking about the pancreatic head and second segment of the duodenum. Given clinical concern, this could reflect mild acute pancreatitis. There is no evidence of devascularization or pseudocyst formation on the recent prior CT. Note that the pancreas is typically not well assessed on ultrasound. Electronically Signed   By: Garald Balding M.D.   On: 07/03/2016 03:58      TODAY-DAY OF DISCHARGE:  Subjective:   Amy [name] today has no headache,no chest abdominal pain,no new weakness tingling or numbness, feels much better wants to go home today.   Objective:   Blood pressure (!) 141/73, pulse 75, temperature 97.9 F (36.6 C), temperature source Oral, resp. rate 18, height 5\' 5"  (1.651 m), weight 125.1 kg (275 lb 12.7 oz), SpO2 99 %.  Intake/Output Summary (Last 24 hours) at 07/16/16 0957 Last data filed at 07/15/16 2231  Gross per 24 hour  Intake              720 ml  Output              450 ml  Net              270 ml   Filed Weights   07/13/16 2014 07/14/16 2020 07/15/16 2016  Weight: 123.5 kg (272 lb 3.2 oz) 124.9 kg (275 lb 4.8 oz) 125.1 kg (275 lb 12.7 oz)    Exam: Awake Alert, Oriented *3, No new F.N deficits, Normal affect [place].AT,PERRAL Supple Neck,No JVD, No cervical lymphadenopathy appriciated.  Symmetrical Chest wall movement, Good air movement bilaterally, CTAB RRR,No Gallops,Rubs or new Murmurs, No Parasternal Heave +ve B.Sounds, Abd Soft, Non tender, No organomegaly appriciated, No rebound -guarding or rigidity. No Cyanosis, Clubbing or edema, No new Rash or bruise   PERTINENT RADIOLOGIC STUDIES: Dg Chest 2 View  Result Date: 07/02/2016 CLINICAL DATA:  Mid lower chest and left sided chest pain up into left shoulder. Severe abd pain and tightness. Dizziness. N/V. SOB. Rapid heartbeat. Skin feels cold - almost clammy, but she states she is burning up. Symptoms have  increased in severity and frequency over the last week. EXAM: CHEST  2 VIEW COMPARISON:  01/05/2016 FINDINGS: Lung volumes are low. There is linear/streaky type opacity at the lung bases there is most likely atelectasis. Infection is possible. Remainder of the lungs is clear. No pleural effusion or pneumothorax. Cardiac silhouette is normal in size. No mediastinal or hilar masses. No evidence of adenopathy. Skeletal structures are intact. IMPRESSION: 1. Lung base opacity accentuated by low lung volumes. This is most likely atelectasis. Consider pneumonia if there are consistent clinical symptoms. 2. No other evidence of acute cardiopulmonary disease. Electronically Signed   By: Lajean Manes M.D.   On: 07/02/2016 16:38   Dg Cervical Spine Complete  Result Date: 06/28/2016 CLINICAL DATA:  Chronic neck pain, no known injury, initial encounter EXAM: CERVICAL SPINE - COMPLETE 4+ VIEW COMPARISON:  None. FINDINGS: Seven cervical segments are well visualized. Vertebral body height is well maintained. No prevertebral soft tissue changes are seen. Mild facet hypertrophic changes are noted. No significant neural foraminal narrowing is seen. The odontoid is within  normal limits. IMPRESSION: Mild degenerative change without acute abnormality. Electronically Signed   By: Inez Catalina M.D.   On: 06/28/2016 08:38   Dg Lumbar Spine Complete W/bend  Result Date: 06/28/2016 CLINICAL DATA:  Low back pain, no known injury, initial encounter EXAM: LUMBAR SPINE - COMPLETE WITH BENDING VIEWS COMPARISON:  05/22/2016 FINDINGS: Multilevel postoperative changes are noted throughout the lumbar spine. The overall appearance of the hardware is stable. Vertebral body height is well maintained. Aortic calcifications are seen. Flexion and extension views show no definitive instability. IMPRESSION: Multilevel postoperative changes. No instability is seen on flexion and extension. No acute abnormality is noted. Electronically Signed   By:  Inez Catalina M.D.   On: 06/28/2016 08:40   Dg Si Joints  Result Date: 06/28/2016 CLINICAL DATA:  Chronic pain.  No reported injury . EXAM: BILATERAL SACROILIAC JOINTS - 3+ VIEW COMPARISON:  CT 06/01/2016.  MRI 02/08/2016 FINDINGS: Lumbosacral spine fusion. Degenerative changes lumbar spine and both SI joints. Degenerative changes both hips. Osteitis pubis again noted. No acute abnormality identified. Pelvic calcifications consistent phleboliths noted. IMPRESSION: Lumbosacral spine fusion. Degenerative changes lumbar spine, both SI joints, both hips. Osteitis pubis again noted. No acute abnormality identified. Electronically Signed   By: Marcello Moores  Register   On: 06/28/2016 06:53   Dg Shoulder Right  Result Date: 06/28/2016 CLINICAL DATA:  Chronic pain.  No recent prior. EXAM: RIGHT SHOULDER - 2+ VIEW COMPARISON:  01/05/2016. FINDINGS: Acromioclavicular glenohumeral degenerative change. Subacromial spurring is noted. No evidence of fracture or dislocation. IMPRESSION: 1.  No acute abnormality. 2. Degenerative changes right shoulder with subacromial spurring. Electronically Signed   By: Marcello Moores  Register   On: 06/28/2016 06:47   Dg Knee 1-2 Views Left  Result Date: 06/28/2016 CLINICAL DATA:  Knee pain common no known injury, initial encounter EXAM: LEFT KNEE - 1-2 VIEW COMPARISON:  None. FINDINGS: No acute fracture or dislocation is noted. No joint effusion is seen. Very minimal medial joint space narrowing is noted. IMPRESSION: Minimal degenerative change without acute abnormality. Electronically Signed   By: Inez Catalina M.D.   On: 06/28/2016 08:38   Dg Knee 1-2 Views Right  Result Date: 06/28/2016 CLINICAL DATA:  Right knee pain, no known injury, initial encounter EXAM: RIGHT KNEE - 1-2 VIEW COMPARISON:  None. FINDINGS: No evidence of fracture, dislocation, or joint effusion. Mild medial joint space narrowing is noted. No soft tissue abnormality is seen. IMPRESSION: Minimal degenerative changes.  Electronically Signed   By: Inez Catalina M.D.   On: 06/28/2016 08:39   Ct Angio Chest Pe W Or Wo Contrast  Result Date: 07/07/2016 CLINICAL DATA:  Exploratory laparotomy yesterday. Shortness of breath. Evaluate for pulmonary embolus. EXAM: CT ANGIOGRAPHY CHEST WITH CONTRAST TECHNIQUE: Multidetector CT imaging of the chest was performed using the standard protocol during bolus administration of intravenous contrast. Multiplanar CT image reconstructions and MIPs were obtained to evaluate the vascular anatomy. CONTRAST:  100 cc Isovue 370 COMPARISON:  None. FINDINGS: Cardiovascular: Heart size upper normal. No pericardial effusion. No thoracic aortic aneurysm. Assessment of segmental and subsegmental pulmonary arteries to the lower lobes is degraded by respiratory motion, but within this limitation there is no CT evidence for an acute pulmonary embolus. Mediastinum/Nodes: No mediastinal lymphadenopathy. There is no hilar lymphadenopathy. There is no axillary lymphadenopathy. NG tube passes into the stomach with nonvisualization of the distal tip. Lungs/Pleura: Bilateral lower lobe subsegmental atelectasis noted. Focal bandlike opacity in the lingula likely also atelectatic. 7 mm ground-glass nodule identified right lower  lobe (image 59 series 5). No evidence for pneumothorax. No pleural effusion. Upper Abdomen: Stomach is fluid distended despite the presence of an NG tube. Musculoskeletal: Bone windows reveal no worrisome lytic or sclerotic osseous lesions. Review of the MIP images confirms the above findings. IMPRESSION: 1. No CT evidence for acute pulmonary embolus. 2. Bilateral lower lobe subsegmental atelectasis with probable lingular atelectasis associated. 3. Stomach is fluid distended despite the presence of an NG tube. Electronically Signed   By: Misty Stanley M.D.   On: 07/07/2016 14:31   Ct Abdomen Pelvis W Contrast  Result Date: 07/05/2016 CLINICAL DATA:  70 year old female with recently diagnosed  small bowel obstruction and pancreatitis. Continued abdominal pelvic pain with nausea. EXAM: CT ABDOMEN AND PELVIS WITH CONTRAST TECHNIQUE: Multidetector CT imaging of the abdomen and pelvis was performed using the standard protocol following bolus administration of intravenous contrast. CONTRAST:  100 cc intravenous Isovue 300 COMPARISON:  07/02/2016 and prior CTs. FINDINGS: Lower chest: Increased moderate bibasilar atelectasis identified. Hepatobiliary: The liver is unremarkable. The patient is status post cholecystectomy. There is no evidence of biliary dilatation. Pancreas: Unremarkable Spleen: Unremarkable Adrenals/Urinary Tract: The kidneys, adrenal glands and bladder are unremarkable except for unchanged 1.7 cm left adrenal adenoma. Stomach/Bowel: An NG tube is present with tip in the mid stomach. Dilated proximal-mid small bowel loops are again identified with collapsed distal small bowel loops, compatible with small bowel obstruction. There has been interval decreased caliber of stomach and dilated small bowel loops since the prior study. There is no evidence of definite bowel wall thickening or pneumoperitoneum. Vascular/Lymphatic: Abdominal aortic atherosclerotic calcifications noted without aneurysm. No enlarged lymph nodes identified. Reproductive: Patient is status post hysterectomy. No adnexal masses identified. Other: No free fluid or focal collection/abscess. Musculoskeletal: Surgical changes within the lumbar spine again identified. No acute or suspicious abnormality. IMPRESSION: Small bowel obstruction again noted, with decompression of stomach and decreased caliber of dilated small bowel loops from NG tube within the stomach. No evidence of abscess, free fluid or pneumoperitoneum. Increased moderate bibasilar atelectasis. Abdominal aortic atherosclerosis. Electronically Signed   By: Margarette Canada M.D.   On: 07/05/2016 13:26   Ct Abdomen Pelvis W Contrast  Result Date: 07/02/2016 CLINICAL DATA:   Epigastric abdominal pain for 5 days. Vomiting starting last night. Prior hysterectomy. Prior abdominal adhesion removal. EXAM: CT ABDOMEN AND PELVIS WITH CONTRAST TECHNIQUE: Multidetector CT imaging of the abdomen and pelvis was performed using the standard protocol following bolus administration of intravenous contrast. CONTRAST:  139mL ISOVUE-300 IOPAMIDOL (ISOVUE-300) INJECTION 61% COMPARISON:  01/30/2016 FINDINGS: Lower chest: Bibasilar atelectasis. Normal heart size without pericardial or pleural effusion. Fluid in the distal esophagus. Hepatobiliary: Normal liver. Cholecystectomy, without biliary ductal dilatation. Pancreas: Normal, without mass or ductal dilatation. Spleen: Normal in size, without focal abnormality. Adrenals/Urinary Tract: Normal right adrenal gland. Left adrenal nodule measures 1.7 cm and is been similar in back to 06/23/2015, consistent with an adenoma on that study. Normal kidneys, without hydronephrosis. Normal urinary bladder. Stomach/Bowel: The stomach is distended and fluid-filled. The colon is normal to minimally decompressed distally. Decompressed terminal and distal ileum. Proximal and mid small bowel loops are dilated, including at 3.7 cm on image 56/series 2. Relatively gradual transition occurs, including within left paracentral anteriorly positioned small bowel loops on image 52/series 2. No obstructive mass. No findings to suggest complicating ischemia. Minimal nonspecific mesenteric interloop edema is seen including on image 56/series 2. Vascular/Lymphatic: Advanced aortic and branch vessel atherosclerosis. No abdominopelvic adenopathy. Reproductive: Hysterectomy.  No adnexal  mass. Other: No significant free fluid. No pneumatosis or free intraperitoneal air. Musculoskeletal: Lumbosacral spine fixation. IMPRESSION: 1. Mid small bowel obstruction, likely secondary to adhesions. No complicating ischemia. 2. Distended, fluid-filled stomach with fluid in the esophagus. The patient  would likely benefit from nasogastric tube placement. 3. Advanced aortic atherosclerosis. 4. Left adrenal adenoma. Electronically Signed   By: Abigail Miyamoto M.D.   On: 07/02/2016 20:44   Dg Chest Port 1 View  Result Date: 07/10/2016 CLINICAL DATA:  Shortness of breath, hypertension, asthma EXAM: PORTABLE CHEST 1 VIEW COMPARISON:  07/07/2016 FINDINGS: Cardiomediastinal silhouette is stable. Mild basilar atelectasis. No pulmonary edema. NG tube in place. Linear atelectasis or infiltrate in lingula. IMPRESSION: No pulmonary edema. Mild basilar atelectasis. Linear atelectasis or infiltrate in lingula. Electronically Signed   By: Lahoma Crocker M.D.   On: 07/10/2016 11:07   Dg Chest Port 1 View  Result Date: 07/05/2016 CLINICAL DATA:  Chest pain EXAM: PORTABLE CHEST 1 VIEW COMPARISON:  07/02/2016 FINDINGS: Cardiomediastinal silhouette is stable. Stable atelectasis or scarring right base. There is streaky atelectasis or infiltrate left base. No pulmonary edema. NG tube poorly visualized due to patient's large body habitus. IMPRESSION: No pulmonary edema. Right base atelectasis or scarring. Streaky left basilar atelectasis or infiltrate. Electronically Signed   By: Lahoma Crocker M.D.   On: 07/05/2016 12:28   Dg Shoulder Left  Result Date: 06/28/2016 CLINICAL DATA:  Chronic pain.  No reported injury. EXAM: LEFT SHOULDER - 2+ VIEW COMPARISON:  08/16/2009. FINDINGS: No acute bony or joint abnormality identified. No evidence of fracture or dislocation. Mild acromioclavicular and glenohumeral degenerative change. IMPRESSION: No acute or focal abnormality. Mild degenerative changes left shoulder . Electronically Signed   By: Marcello Moores  Register   On: 06/28/2016 06:47   Dg Abd Portable 1v  Result Date: 07/05/2016 CLINICAL DATA:  Small-bowel obstruction. EXAM: PORTABLE ABDOMEN - 1 VIEW COMPARISON:  07/04/2016 FINDINGS: NG tube in the stomach. Stomach is decompressed. No dilated loops of bowel identified. No bowel wall  edema. Lumbar fusion hardware unchanged. IMPRESSION: Nonobstructive bowel gas pattern.  NG tube remains in the stomach. Electronically Signed   By: Franchot Gallo M.D.   On: 07/05/2016 08:43   Dg Abd Portable 1v-small Bowel Obstruction Protocol-24 Hr Delay  Result Date: 07/04/2016 CLINICAL DATA:  Delayed image for assessment of small bowel obstruction. EXAM: PORTABLE ABDOMEN - 1 VIEW COMPARISON:  07/03/2016 FINDINGS: Contrast injected into the stomach under previous exam is no longer visualized. It has passed. There is no evidence of small-bowel obstruction. Nasogastric tube is well positioned within the mid stomach. Bowel gas pattern is unremarkable. IMPRESSION: 1. No evidence of a small-bowel obstruction. Electronically Signed   By: Lajean Manes M.D.   On: 07/04/2016 09:15   Dg Abd Portable 1v-small Bowel Obstruction Protocol-initial, 8 Hr Delay  Result Date: 07/03/2016 CLINICAL DATA:  70 year old female with small bowel obstruction. Subsequent encounter. EXAM: PORTABLE ABDOMEN - 1 VIEW COMPARISON:  07/02/2016 plain film and CT. FINDINGS: Nasogastric tube with tip gastric body level. Contrast and gas-filled dilated stomach. Gas-filled small bowel loops. The caliber of the small bowel loops is not adequately assessed by plain film examination (as small bowel loops may be more fluid-filled than gas-filled when compared to prior exam) and small bowel obstructive pattern may still be present. The possibility of free intraperitoneal air cannot be assessed on a supine view. Postsurgical changes lumbar spine. IMPRESSION: Contrast and gas-filled dilated stomach. Gas-filled small bowel loops. The caliber of the small bowel loops  is not adequately assessed by plain film examination (as small bowel loops may be more fluid-filled than gas-filled when compared to prior exam) and small bowel obstructive pattern may still be present. Electronically Signed   By: Genia Del M.D.   On: 07/03/2016 15:51   Dg Abd  Portable 1 View  Result Date: 07/02/2016 CLINICAL DATA:  NG tube placement. EXAM: PORTABLE ABDOMEN - 1 VIEW COMPARISON:  CT abdomen/ pelvis earlier this day FINDINGS: Tip and side port of the enteric tube below the diaphragm in the stomach. Gaseous gastric distention. Small bowel dilatation is partially included. Bibasilar atelectasis is seen. IMPRESSION: Tip and side port of the enteric tube below the diaphragm in the stomach. Electronically Signed   By: Jeb Levering M.D.   On: 07/02/2016 23:25   Dg Hip Unilat W Or W/o Pelvis 2-3 Views Left  Result Date: 06/28/2016 CLINICAL DATA:  Chronic pain. EXAM: DG HIP (WITH OR WITHOUT PELVIS) 2-3V LEFT COMPARISON:  05/22/2016.  MRI 02/08/2016. FINDINGS: Prior lumbosacral spine fusion. Degenerative changes lumbar spine, both SI joints, both hips. Osteitis pubis again noted. IMPRESSION: No acute abnormality. Prior lumbosacral spine fusion. Degenerative changes both SI joints and hips. Osteitis pubis again noted. Electronically Signed   By: Marcello Moores  Register   On: 06/28/2016 06:50   Dg Hip Unilat W Or W/o Pelvis 2-3 Views Right  Result Date: 06/28/2016 CLINICAL DATA:  Chronic pain.  No reported injury. EXAM: DG HIP (WITH OR WITHOUT PELVIS) 2-3V RIGHT COMPARISON:  CT 06/01/2016.  MRI 02/08/2016. FINDINGS: Lumbosacral spine fusion. Degenerative changes lumbar spine, both SI joints, both hips. Osteitis pubis again noted. No acute abnormality. No evidence of fracture dislocation. IMPRESSION: Lumbosacral spine fusion. Degenerative changes lumbar spine, both SI joints, both hips. Osteitis pubis again noted. No acute bony abnormality identified . Electronically Signed   By: Marcello Moores  Register   On: 06/28/2016 06:51   US Abdomen Limited Ruq  Result Date: 07/03/2016 CLINICAL DATA:  Elevated LFTs.  Initial encounter. EXAM: US ABDOMEN LIMITED - RIGHT UPPER QUADRANT COMPARISON:  CT of the abdomen and pelvis from 07/02/2016 FINDINGS: Gallbladder: Status post cholecystectomy.   No retained stones seen. Common bile duct: Diameter: 0.3 cm, within normal limits in caliber. Difficult to fully characterize due to overlying structures. Liver: No focal lesion identified. Within normal limits in parenchymal echogenicity. IMPRESSION: Status post cholecystectomy. No retained stones seen. Unremarkable ultrasound of the right upper quadrant. On further assessment of the recent CT of the abdomen and pelvis, there is soft tissue inflammation tracking about the pancreatic head and second segment of the duodenum. Given clinical concern, this could reflect mild acute pancreatitis. There is no evidence of devascularization or pseudocyst formation on the recent prior CT. Note that the pancreas is typically not well assessed on ultrasound. Electronically Signed   By: Garald Balding M.D.   On: 07/03/2016 03:58     PERTINENT LAB RESULTS: CBC: No results for input(s): WBC, HGB, HCT, PLT in the last 72 hours. CMET CMP     Component Value Date/Time   NA 139 07/13/2016 0500   NA 140 02/14/2016   K 3.6 07/13/2016 0500   CL 105 07/13/2016 0500   CO2 29 07/13/2016 0500   GLUCOSE 107 (H) 07/13/2016 0500   BUN 12 07/13/2016 0500   BUN 13 02/14/2016   CREATININE 0.57 07/13/2016 0500   CALCIUM 8.0 (L) 07/13/2016 0500   PROT 6.0 (L) 07/12/2016 0450   ALBUMIN 2.1 (L) 07/12/2016 0450   AST 28 07/12/2016  0450   ALT 33 07/12/2016 0450   ALKPHOS 89 07/12/2016 0450   BILITOT 0.4 07/12/2016 0450   GFRNONAA >60 07/13/2016 0500   GFRAA >60 07/13/2016 0500    GFR Estimated Creatinine Clearance: 87 mL/min (by C-G formula based on SCr of 0.57 mg/dL). No results for input(s): LIPASE, AMYLASE in the last 72 hours. No results for input(s): CKTOTAL, CKMB, CKMBINDEX, TROPONINI in the last 72 hours. Invalid input(s): POCBNP No results for input(s): DDIMER in the last 72 hours. No results for input(s): HGBA1C in the last 72 hours. No results for input(s): CHOL, HDL, LDLCALC, TRIG, CHOLHDL, LDLDIRECT in  the last 72 hours. No results for input(s): TSH, T4TOTAL, T3FREE, THYROIDAB in the last 72 hours.  Invalid input(s): FREET3 No results for input(s): VITAMINB12, FOLATE, FERRITIN, TIBC, IRON, RETICCTPCT in the last 72 hours. Coags: No results for input(s): INR in the last 72 hours.  Invalid input(s): PT Microbiology: Recent Results (from the past 240 hour(s))  MRSA PCR Screening     Status: None   Collection Time: 07/07/16  6:44 PM  Result Value Ref Range Status   MRSA by PCR NEGATIVE NEGATIVE Final    Comment:        The GeneXpert MRSA Assay (FDA approved for NASAL specimens only), is one component of a comprehensive MRSA colonization surveillance program. It is not intended to diagnose MRSA infection nor to guide or monitor treatment for MRSA infections.     FURTHER DISCHARGE INSTRUCTIONS:  Get Medicines reviewed and adjusted: Please take all your medications with you for your next visit with your Primary MD  Laboratory/radiological data: Please request your Primary MD to go over all hospital tests and procedure/radiological results at the follow up, please ask your Primary MD to get all Hospital records sent to his/her office.  In some cases, they will be blood work, cultures and biopsy results pending at the time of your discharge. Please request that your primary care M.D. goes through all the records of your hospital data and follows up on these results.  Also Note the following: If you experience worsening of your admission symptoms, develop shortness of breath, life threatening emergency, suicidal or homicidal thoughts you must seek medical attention immediately by calling 911 or calling your MD immediately  if symptoms less severe.  You must read complete instructions/literature along with all the possible adverse reactions/side effects for all the Medicines you take and that have been prescribed to you. Take any new Medicines after you have completely understood and  accpet all the possible adverse reactions/side effects.   Do not drive when taking Pain medications or sleeping medications (Benzodaizepines)  Do not take more than prescribed Pain, Sleep and Anxiety Medications. It is not advisable to combine anxiety,sleep and pain medications without talking with your primary care practitioner  Special Instructions: If you have smoked or chewed Tobacco  in the last 2 yrs please stop smoking, stop any regular Alcohol  and or any Recreational drug use.  Wear Seat belts while driving.  Please note: You were cared for by a hospitalist during your hospital stay. Once you are discharged, your primary care physician will handle any further medical issues. Please note that NO REFILLS for any discharge medications will be authorized once you are discharged, as it is imperative that you return to your primary care physician (or establish a relationship with a primary care physician if you do not have one) for your post hospital discharge needs so that they can reassess  your need for medications and monitor your lab values.  Total Time spent coordinating discharge including counseling, education and face to face time equals 45 minutes.  SignedOren Binet 07/16/2016 9:57 AM

## 2016-07-16 NOTE — Consult Note (Signed)
   [facility] CM Inpatient Consult   07/16/2016  Amy [name] 04/30/1946 [id number]     Patient screened for potential [facility] Care Management services. Chart reviewed. Noted current discharge plan is for SNF. Confirmed with inpatient RNCM.  There are no identifiable [facility] Care Management needs at this time. If patient's post hospital needs change, please place a [facility] Care Management consult. For questions please contact:  Marthenia Rolling, [place], RN,BSN [facility] Liaison [phone]

## 2016-07-16 NOTE — Clinical Social Work Note (Signed)
CSW met with patient. She has accepted bed offer at [facility]. Admissions coordinator notified. Patient can transport once picc line removed, per RN.  Dayton Scrape, [place]

## 2016-07-16 NOTE — Clinical Social Work Placement (Signed)
   CLINICAL SOCIAL WORK PLACEMENT  NOTE  Date:  07/16/2016  Patient Details  Name: Amy [name] MRN: [id number] Date of Birth: 1946-06-17  Clinical Social Work is seeking post-discharge placement for this patient at the [place] level of care (*CSW will initial, date and re-position this form in  chart as items are completed):  Yes   Patient/family provided with [facility] Work Department's list of facilities offering this level of care within the geographic area requested by the patient (or if unable, by the patient's family).  Yes   Patient/family informed of their freedom to choose among providers that offer the needed level of care, that participate in Medicare, Medicaid or managed care program needed by the patient, have an available bed and are willing to accept the patient.  Yes   Patient/family informed of [facility]'s ownership interest in [facility] and [facility], as well as of the fact that they are under no obligation to receive care at these facilities.  PASRR submitted to EDS on 07/13/16     PASRR number received on       Existing PASRR number confirmed on 07/13/16     FL2 transmitted to all facilities in geographic area requested by pt/family on 07/13/16     FL2 transmitted to all facilities within larger geographic area on       Patient informed that his/her managed care company has contracts with or will negotiate with certain facilities, including the following:        Yes   Patient/family informed of bed offers received.  Patient chooses bed at [facility]     Physician recommends and patient chooses bed at      Patient to be transferred to [facility] on 07/16/16.  Patient to be transferred to facility by [facility]     Patient family notified on 07/16/16 of transfer.  Name of family member notified:        PHYSICIAN       Additional Comment:     _______________________________________________ Candie Chroman, LCSW 07/16/2016, 1:01 PM

## 2016-07-16 NOTE — Progress Notes (Signed)
10 Days [facility] Progress Note  Subjective: Doing well and without complaint. Tolerating soft diet and is both passing flatus and having BMs. No issues over night.   Objective: Vital signs in last 24 hours: Temp:  [97.9 F (36.6 C)-98.6 F (37 C)] 97.9 F (36.6 C) (10/22 2016) Pulse Rate:  [75] 75 (10/22 2016) Resp:  [18] 18 (10/22 2016) BP: (130-141)/(70-73) 141/73 (10/22 2016) SpO2:  [99 %] 99 % (10/22 2016) Weight:  [125.1 kg (275 lb 12.7 oz)] 125.1 kg (275 lb 12.7 oz) (10/22 2016) Last BM Date: 07/14/16  Intake/Output from previous day: 10/22 0701 - 10/23 0700 In: 720 [P.O.:720] Out: 450 [Urine:450] Intake/Output this shift: No intake/output data recorded.  PE: General: pleasant, WD, WN AA female who is laying in bed in NAD HEENT: Sclera are nonicteric. Mouth is pink and moist Heart: regular, rate, and rhythm.  Normal s1,s2. Lungs: CTAB, no wheezes, rhonchi, or rales noted.  Respiratory effort nonlabored Abd: soft, NT, ND, +BS. Minimal expected midline incisional tenderness. Surgical incision clean, dry and intact with staples. No clinical indications of wound infection.  Skin: warm and dry Psych: A&Ox3 with an appropriate affect.  Lab Results:  No results for input(s): WBC, HGB, HCT, PLT in the last 72 hours. BMET No results for input(s): NA, K, CL, CO2, GLUCOSE, BUN, CREATININE, CALCIUM in the last 72 hours. PT/INR No results for input(s): LABPROT, INR in the last 72 hours. CMP     Component Value Date/Time   NA 139 07/13/2016 0500   NA 140 02/14/2016   K 3.6 07/13/2016 0500   CL 105 07/13/2016 0500   CO2 29 07/13/2016 0500   GLUCOSE 107 (H) 07/13/2016 0500   BUN 12 07/13/2016 0500   BUN 13 02/14/2016   CREATININE 0.57 07/13/2016 0500   CALCIUM 8.0 (L) 07/13/2016 0500   PROT 6.0 (L) 07/12/2016 0450   ALBUMIN 2.1 (L) 07/12/2016 0450   AST 28 07/12/2016 0450   ALT 33 07/12/2016 0450   ALKPHOS 89 07/12/2016 0450   BILITOT 0.4  07/12/2016 0450   GFRNONAA >60 07/13/2016 0500   GFRAA >60 07/13/2016 0500   Lipase     Component Value Date/Time   LIPASE 61 (H) 07/04/2016 0318       Studies/Results: No results found.  Anti-infectives: Anti-infectives    Start     Dose/Rate Route Frequency Ordered Stop   07/06/16 1600  ciprofloxacin (CIPRO) IVPB 400 mg     400 mg 200 mL/hr over 60 Minutes Intravenous To [facility] 07/06/16 0956 07/06/16 1254       Assessment/Plan s/p Procedure(s): LAPAROSCOPY (DIAGNOSTIC) with EXPLORATORY LAPAROTOMY LYSIS OF ADHESIONS. POD # 9 Tolerating diet with return of bowel function. Ambulatory. Patient recovery well from surgical standpoint and can be discharged either to home or SNF per IM service. Staples can be removed from exp. Lap. Incision on POD # 14.    LOS: 14 days    LEE PRESSON, [facility] 07/16/2016, 9:07 AM

## 2016-07-18 ENCOUNTER — Non-Acute Institutional Stay (SKILLED_NURSING_FACILITY): Payer: Medicare Other | Admitting: Family

## 2016-07-18 DIAGNOSIS — R208 Other disturbances of skin sensation: Secondary | ICD-10-CM

## 2016-07-18 DIAGNOSIS — G8929 Other chronic pain: Secondary | ICD-10-CM | POA: Diagnosis not present

## 2016-07-18 DIAGNOSIS — R1312 Dysphagia, oropharyngeal phase: Secondary | ICD-10-CM | POA: Diagnosis not present

## 2016-07-18 DIAGNOSIS — L7682 Other postprocedural complications of skin and subcutaneous tissue: Secondary | ICD-10-CM

## 2016-07-18 MED ORDER — OXYCODONE HCL 5 MG PO TABS: 5.0000 mg | ORAL_TABLET | ORAL | 0 refills | Status: DC | PRN

## 2016-07-18 NOTE — Progress Notes (Signed)
Location:  [facility] Room Number: [facility]:  SNF (31) Provider:  Dinah Ngetich FNP-C   Blanchie Serve, MD  Patient Care Team: Blanchie Serve, MD as PCP - General (Internal Medicine)  Extended Emergency Contact Information Primary Emergency Contact: [name] Address: [address]          [place], [place] [address] [name] of [place] Phone: [phone] Mobile Phone: [phone] Relation: Spouse Secondary Emergency Contact: [name] Address: [address]          [place], [place] [address] [name] of [place] Phone: [phone] Mobile Phone: [phone] Relation: Daughter  Code Status:  DNR  Goals of care: Advanced Directive information Advanced Directives 07/02/2016  Does patient have an advance directive? Yes  Type of Advance Directive Living will  Does patient want to make changes to advanced directive? -  Copy of advanced directive(s) in chart? -  Would patient like information on creating an advanced directive? -  Pre-existing out of facility DNR order (yellow form or pink MOST form) -     Chief Complaint  Patient presents with  . Acute Visit    Pain     HPI:  Pt is a 70 y.o. female seen today at [facility] for an acute visit for evaluation of pain on incision site. She is status post hospital admission from 07/02/2016-07/16/2016 for Small bowel obstruction. She underwent exploratory laparotomy and lysis of adhesions 10/13. She is seen in her room today per facility Nurse request who reports patient's pain not controlled with current pain regimen. She complains of pain on incision site rating 8-10 on scale though had one tablet according to facility medication administration. She is currently on oxycodone 5 mg Tablet 1-2 tabs q 6HRS PRN.she also states having trouble swallowing big medications due to recent intubation. Has chloraseptic spray at bedside but has not used it.  She denies any problems with meals.She has been exercising with PT/OT.    Past Medical History:  Diagnosis Date  . Abdominal pain of unknown etiology 02/05/2016  . Anxiety   . Arthritis    ddd- RA  . Asthma    "sleeping asthma"  . Chronic back pain    lumbar steroid injection's  . Constipation   . Depression   . Dysphonia    intermittent "voice changes"  . Fibromyalgia   . GERD (gastroesophageal reflux disease)   . Glaucoma   . Headache   . History of DVT (deep vein thrombosis) 1989   LEFT UPPER ARM  . History of hiatal hernia   . History of MRSA infection 2011  . Hypertension   . Irregular heart rate    "years ago"  . Low iron   . Mild obstructive sleep apnea    per study 02-07-2006 - no cpap  . Neuropathy (HCC)    feet  . Pelvic pain in female   . Peripheral vascular disease ([place])    "poor circulation"  . SBO (small bowel obstruction) 06/2016  . Seasonal allergies   . Short of breath on exertion   . Urinary frequency   . Weakness    both hands and feet   Past Surgical History:  Procedure Laterality Date  . abdominal adhesions removed    . ABDOMINAL HYSTERECTOMY  1978  . BUNIONECTOMY Left 2008  . COLONOSCOPY    . CYSTO WITH HYDRODISTENSION N/A 07/12/2015   Procedure: CYSTOSCOPY/HYDRODISTENSION;  Surgeon: Bjorn Loser, MD;  Location:  [facility];  Service: Urology;  Laterality: N/A;  . ESOPHAGEAL MANOMETRY N/A 11/22/2014   Procedure: ESOPHAGEAL MANOMETRY (EM);  Surgeon: Winfield Cunas., MD;  Location: [facility];  Service: Endoscopy;  Laterality: N/A;  . ESOPHAGOGASTRODUODENOSCOPY N/A 07/15/2013   Procedure: ESOPHAGOGASTRODUODENOSCOPY (EGD);  Surgeon: Winfield Cunas., MD;  Location: [facility];  Service: Endoscopy;  Laterality: N/A;  need xray  . EYE SURGERY Bilateral    cataract surgery with lens implants  . HARDWARE REMOVAL N/A 01/02/2016   Procedure: Exploration of Lumbar Fusion,Removal of hardware Lumbar One-Two ;Redo Posterior  Lumbar Fusion Lumbar One-Two;  Surgeon: Kary Kos, MD;  Location: [facility];  Service: Neurosurgery;  Laterality: N/A;  . LAPAROSCOPIC CHOLECYSTECTOMY  01-11-2006  . LAPAROSCOPY N/A 07/06/2016   Procedure: LAPAROSCOPY DIAGNOSTIC EMERGENT TO OPEN;  Surgeon: Greer Pickerel, MD;  Location: [facility];  Service: General;  Laterality: N/A;  . LAPAROTOMY N/A 07/06/2016   Procedure: EXPLORATORY LAPAROTOMY LYSIS OF ADHESIONS FOR 3 HOURS;  Surgeon: Greer Pickerel, MD;  Location: [facility];  Service: General;  Laterality: N/A;  . LUMBAR FUSION  2014   L4 -- L5  . MASS EXCISION Left 02/20/2013   Procedure: EXCISION LEFT BUTTOCK  MASS;  Surgeon: Adin Hector, MD;  Location: [facility];  Service: General;  Laterality: Left;  . NOSE SURGERY  2007  . REMOVAL HARDWARE L4-L5/  BILATERAL LAMINECTOMY L2 - L5 AND FUSION  06-12-2011  . SHOULDER OPEN ROTATOR CUFF REPAIR Left 05/05/2014   Procedure: OPEN ACROMIONECTOMY AND OPEN REPAIR OF ROTATOR CUFF, TISSUEMEND GRAFT WITH ANCHOR ;  Surgeon: Tobi Bastos, MD;  Location: [facility];  Service: Orthopedics;  Laterality: Left;  . SPINE SURGERY    . TONSILLECTOMY      Allergies  Allergen Reactions  . Latex Other (See Comments)    Sores, blisters - reaction to gloves  . Penicillins Other (See Comments)    Pt was told she was allergic to penicillin in her mid 20'2 - does not recall the reaction. She has taken amoxicillin and ampicillin with no reaction.       Medication List       Accurate as of 07/18/16  2:58 PM. Always use your most recent med list.          acetaminophen 500 MG tablet Commonly known as:  TYLENOL Take 2 tablets (1,000 mg total) by mouth every 8 (eight) hours as needed (pain).   CALCIUM-VITAMIN D3 PO Take 1 tablet by mouth daily.   cloNIDine 0.1 MG tablet Commonly known as:  CATAPRES Take 0.1 mg by mouth 3 (three) times daily.   diphenhydrAMINE 25 mg capsule Commonly known as:  BENADRYL Take 25 mg by mouth 2 (two) times daily as needed for  allergies.   estradiol 0.1 MG/GM vaginal cream Commonly known as:  ESTRACE Place 1 Applicatorful vaginally 2 (two) times a week. Tuesdays and Thursdays   feeding supplement Liqd Take 1 Container by mouth 2 (two) times daily between meals.   fluticasone 50 MCG/ACT nasal spray Commonly known as:  FLONASE Place 1 spray into both nostrils daily.   gabapentin 300 MG capsule Commonly known as:  NEURONTIN Take 300 mg by mouth 2 (two) times daily.   hydrALAZINE 25 MG tablet Commonly known as:  APRESOLINE Take 25 mg by mouth 3 (three) times daily.   ICY HOT EX Apply 1 application topically 2 (two) times daily as needed (pain). Gel   meloxicam 15 MG tablet Commonly known  as:  MOBIC Take 15 mg by mouth daily.   METANX 3-90.314-2-35 MG Caps Take 1 capsule by mouth 2 (two) times daily.   metoprolol 50 MG tablet Commonly known as:  LOPRESSOR Take 1 tablet (50 mg total) by mouth 2 (two) times daily.   multivitamin with minerals Tabs tablet Take 1 tablet by mouth daily.   oxyCODONE 5 MG immediate release tablet Commonly known as:  Oxy IR/ROXICODONE Take 1-2 tablets (5-10 mg total) by mouth every 4 (four) hours as needed for moderate pain.   pantoprazole 40 MG tablet Commonly known as:  PROTONIX Take 40 mg by mouth daily.   PARoxetine 40 MG tablet Commonly known as:  PAXIL Take 40 mg by mouth daily. Reported on 01/18/2016   polyethylene glycol packet Commonly known as:  MIRALAX / GLYCOLAX Take 17 g by mouth daily as needed (constipation).   sennosides-docusate sodium 8.6-50 MG tablet Commonly known as:  SENOKOT-S Take 2 tablets by mouth daily.   silodosin 8 MG Caps capsule Commonly known as:  RAPAFLO Take 8 mg by mouth daily as needed (difficulty urinating).   VITAMIN B COMPLEX PO Take 1 tablet by mouth daily.   vitamin E 400 UNIT capsule Take 400 Units by mouth daily.       Review of Systems  Constitutional: Negative for activity change, appetite change, chills,  fatigue and fever.  HENT: Positive for trouble swallowing. Negative for congestion, rhinorrhea, sinus pressure, sneezing and sore throat.   Eyes: Negative.   Respiratory: Negative for cough, chest tightness, shortness of breath and wheezing.   Cardiovascular: Negative for chest pain, palpitations and leg swelling.  Gastrointestinal: Negative for abdominal distention, abdominal pain, constipation, diarrhea, nausea and vomiting.  Endocrine: Negative.   Genitourinary: Negative for flank pain, frequency and urgency.  Musculoskeletal: Positive for back pain and gait problem.  Skin: Negative for color change, pallor and rash.       Mid -abd surgical drsg  Neurological: Negative for dizziness, syncope, light-headedness and headaches.  Hematological: Does not bruise/bleed easily.  Psychiatric/Behavioral: Negative for agitation, confusion, hallucinations and sleep disturbance. The patient is not nervous/anxious.     Immunization History  Administered Date(s) Administered  . Influenza-Unspecified 06/26/2016  . PPD Test 10/03/2015, 01/06/2016, 01/16/2016, 02/01/2016, 02/15/2016   Pertinent  Health Maintenance Due  Topic Date Due  . COLONOSCOPY  01/23/1996  . DEXA SCAN  01/23/2011  . PNA vac Low Risk Adult (1 of 2 - PCV13) 09/24/2016 (Originally 01/23/2011)  . MAMMOGRAM  11/28/2017  . INFLUENZA VACCINE  Completed   Fall Risk  06/26/2016  Falls in the past year? No      Vitals:   07/18/16 1100  BP: (!) 155/82  Pulse: 88  Resp: 20  Temp: 98.3 F (36.8 C)  SpO2: 95%  Weight: 273 lb 4.8 oz (124 kg)  Height: 5\' 5"  (1.651 m)   Body mass index is 45.48 kg/m. Physical Exam  Constitutional: She is oriented to person, place, and time. She appears well-developed and well-nourished. No distress.  HENT:  Head: Normocephalic.  Mouth/Throat: Oropharynx is clear and moist. No oropharyngeal exudate.  Speaks in low tone voice   Eyes: Conjunctivae and EOM are normal. Pupils are equal, round, and  reactive to light. Right eye exhibits no discharge. Left eye exhibits no discharge. No scleral icterus.  Neck: Normal range of motion. No JVD present. No thyromegaly present.  Cardiovascular: Normal rate, regular rhythm, normal heart sounds and intact distal pulses.  Exam reveals no gallop and  no friction rub.   No murmur heard. Pulmonary/Chest: Effort normal and breath sounds normal. No respiratory distress. She has no wheezes. She has no rales.  Abdominal: Soft. Bowel sounds are normal. She exhibits no distension. There is no tenderness. There is no rebound and no guarding.  Genitourinary:  Genitourinary Comments: Continent   Musculoskeletal: Normal range of motion. She exhibits no edema, tenderness or deformity.  Lymphadenopathy:    She has no cervical adenopathy.  Neurological: She is oriented to person, place, and time.  Skin: Skin is warm and dry. No rash noted. No erythema. No pallor.  Mid- Abdominal Surg drsg dry,clean and intact old small amounts of serosanguinous drainage noted on suprapubic part of the drsg. X 3 staples noted to left abd quadrant intact.   Psychiatric: She has a normal mood and affect.    Labs reviewed:  Recent Labs  07/11/16 0433 07/11/16 2000 07/12/16 0450 07/13/16 0500  NA 141 142 141 139  K 2.6* 3.1* 3.4* 3.6  CL 105 109 107 105  CO2 25 27 28 29   GLUCOSE 91 106* 132* 107*  BUN 13 14 13 12   CREATININE 0.77 0.61 0.55 0.57  CALCIUM 8.4* 8.3* 8.1* 8.0*  MG 1.9  --  2.0 2.0  PHOS 4.0  --  2.6 2.9    Recent Labs  07/10/16 1407 07/11/16 0433 07/12/16 0450  AST 34 38 28  ALT 32 37 33  ALKPHOS 88 94 89  BILITOT 0.7 0.6 0.4  PROT 6.8 6.5 6.0*  ALBUMIN 2.3* 2.1* 2.1*    Recent Labs  07/02/16 1650 07/03/16 0627  07/08/16 0554 07/10/16 1407 07/11/16 0433  WBC 10.1 4.5  < > 11.3* 12.2* 11.5*  NEUTROABS 8.2* 3.0  --   --   --  9.0*  HGB 15.4* 14.5  < > 13.1 12.5 11.2*  HCT 45.4 44.1  < > 38.5 37.0 33.5*  MCV 91.3 91.9  < > 90.4 89.6 89.8    PLT 323 307  < > 232 291 275  < > = values in this interval not displayed. Lab Results  Component Value Date   TSH 2.825 07/07/2016   No results found for: HGBA1C Lab Results  Component Value Date   CHOL 165 07/03/2016   HDL 82 07/03/2016   LDLCALC 75 07/03/2016   TRIG 101 07/11/2016   CHOLHDL 2.0 07/03/2016   Assessment/Plan 1. Pain at surgical incision Rates pain 8-10 on scale. Change oxycodone 5 mg Tablet to 1-2 tablets every 4 HRS PRN for moderate to severe pain. Will Continue to reevaluate pain and wean off pain meds as tolerated.   2. Chronic pain Change oxycodone as above.   3. Dysphagia  Difficult swallowing large pills. May crush meds and given in apple sauce. Continue with chloraseptic spray. ST to evaluate swallowing.     Family/ staff Communication: Reviewed plan of care with patient and facility Nurse supervisor.  Labs/tests ordered:  None

## 2016-07-19 ENCOUNTER — Encounter: Payer: Self-pay | Admitting: Internal Medicine

## 2016-07-19 ENCOUNTER — Non-Acute Institutional Stay (SKILLED_NURSING_FACILITY): Payer: Medicare Other | Admitting: Internal Medicine

## 2016-07-19 DIAGNOSIS — R3911 Hesitancy of micturition: Secondary | ICD-10-CM | POA: Diagnosis not present

## 2016-07-19 DIAGNOSIS — L89322 Pressure ulcer of left buttock, stage 2: Secondary | ICD-10-CM | POA: Diagnosis not present

## 2016-07-19 DIAGNOSIS — T814XXA Infection following a procedure, initial encounter: Secondary | ICD-10-CM

## 2016-07-19 DIAGNOSIS — K5901 Slow transit constipation: Secondary | ICD-10-CM | POA: Diagnosis not present

## 2016-07-19 DIAGNOSIS — I10 Essential (primary) hypertension: Secondary | ICD-10-CM | POA: Diagnosis not present

## 2016-07-19 DIAGNOSIS — R5381 Other malaise: Secondary | ICD-10-CM

## 2016-07-19 DIAGNOSIS — K219 Gastro-esophageal reflux disease without esophagitis: Secondary | ICD-10-CM | POA: Diagnosis not present

## 2016-07-19 DIAGNOSIS — K56609 Unspecified intestinal obstruction, unspecified as to partial versus complete obstruction: Secondary | ICD-10-CM | POA: Diagnosis not present

## 2016-07-19 DIAGNOSIS — M792 Neuralgia and neuritis, unspecified: Secondary | ICD-10-CM

## 2016-07-19 DIAGNOSIS — F329 Major depressive disorder, single episode, unspecified: Secondary | ICD-10-CM

## 2016-07-19 DIAGNOSIS — D72828 Other elevated white blood cell count: Secondary | ICD-10-CM | POA: Diagnosis not present

## 2016-07-19 DIAGNOSIS — D62 Acute posthemorrhagic anemia: Secondary | ICD-10-CM

## 2016-07-19 DIAGNOSIS — IMO0001 Reserved for inherently not codable concepts without codable children: Secondary | ICD-10-CM

## 2016-07-19 DIAGNOSIS — R1310 Dysphagia, unspecified: Secondary | ICD-10-CM

## 2016-07-19 NOTE — Progress Notes (Signed)
LOCATION: [name]  PCP: Blanchie Serve, MD   Code Status: DNR  Goals of care: Advanced Directive information Advanced Directives 07/19/2016  Does patient have an advance directive? Yes  Type of Advance Directive Out of facility DNR (pink MOST or yellow form)  Does patient want to make changes to advanced directive? No - Patient declined  Copy of advanced directive(s) in chart? Yes  Would patient like information on creating an advanced directive? -  Pre-existing out of facility DNR order (yellow form or pink MOST form) -       Extended Emergency Contact Information Primary Emergency Contact: [name] Address: [address]          [place], [place] [address] [place] Phone: [phone] Mobile Phone: [phone] Relation: Spouse Secondary Emergency Contact: [name] Address: [address]          [place],  [address] [name] of [place] Phone: [phone] Mobile Phone: [phone] Relation: Daughter   Allergies  Allergen Reactions  . Latex Other (See Comments)    Sores, blisters - reaction to gloves  . Penicillins Other (See Comments)    Pt was told she was allergic to penicillin in her mid 20'2 - does not recall the reaction. She has taken amoxicillin and ampicillin with no reaction.     Chief Complaint  Patient presents with  . New Admit To SNF    New Admission Visit     HPI:  Patient is a 70 y.o. female seen today for short term rehabilitation post hospital admission from 07/02/16-07/16/16 with small bowel obstruction. She failed conservative management. She underwent exploratory laprotomy and lysis of adhesions. She was placed on TPN and later switched to po food as tolerated. She had sinus tachycardia. Pulmonary embolism was ruled out. She has medical history of DVT, GERD, chronic back pain, HTN among others.   Review of Systems:  Constitutional: Negative for fever, chills, diaphoresis.  HENT:  Negative for congestion, difficulty swallowing. Positive for some headache and nasal congestion.  Eyes: Negative for blurred vision, double vision and discharge. Wears glasses.  Respiratory: Negative for shortness of breath and wheezing. Positive for occasional dry cough.  Cardiovascular: Negative for chest pain, palpitations, leg swelling.  Gastrointestinal: Negative for nausea, vomiting. Positive for heartburn, abdominal pain and poor appetite. Last bowel movement was Sunday morning. Passing gas. Genitourinary: Negative for dysuria and flank pain.  Musculoskeletal: Negative for fall in the facility. Positive for back pain. Skin: Negative for itching, rash.  Neurological: Positive for some dizziness. Psychiatric/Behavioral: Negative for depression   Past Medical History:  Diagnosis Date  . Abdominal pain of unknown etiology 02/05/2016  . Anxiety   . Arthritis    ddd- RA  . Asthma    "sleeping asthma"  . Chronic back pain    lumbar steroid injection's  . Constipation   . Depression   . Dysphonia    intermittent "voice changes"  . Fibromyalgia   . GERD (gastroesophageal reflux disease)   . Glaucoma   . Headache   . History of DVT (deep vein thrombosis) 1989   LEFT UPPER ARM  . History of hiatal hernia   . History of MRSA infection 2011  . Hypertension   . Irregular heart rate    "years ago"  . Low iron   . Mild obstructive sleep apnea    per study 02-07-2006 - no cpap  . Neuropathy (HCC)    feet  . Pelvic pain in female   .  Peripheral vascular disease ([place] [facility])    "poor circulation"  . SBO (small bowel obstruction) 06/2016  . Seasonal allergies   . Short of breath on exertion   . Urinary frequency   . Weakness    both hands and feet   Past Surgical History:  Procedure Laterality Date  . abdominal adhesions removed    . ABDOMINAL HYSTERECTOMY  1978  . BUNIONECTOMY Left 2008  . COLONOSCOPY    . CYSTO WITH HYDRODISTENSION N/A 07/12/2015   Procedure:  CYSTOSCOPY/HYDRODISTENSION;  Surgeon: Bjorn Loser, MD;  Location: [facility];  Service: Urology;  Laterality: N/A;  . ESOPHAGEAL MANOMETRY N/A 11/22/2014   Procedure: ESOPHAGEAL MANOMETRY (EM);  Surgeon: Winfield Cunas., MD;  Location: [facility];  Service: Endoscopy;  Laterality: N/A;  . ESOPHAGOGASTRODUODENOSCOPY N/A 07/15/2013   Procedure: ESOPHAGOGASTRODUODENOSCOPY (EGD);  Surgeon: Winfield Cunas., MD;  Location: [facility];  Service: Endoscopy;  Laterality: N/A;  need xray  . EYE SURGERY Bilateral    cataract surgery with lens implants  . HARDWARE REMOVAL N/A 01/02/2016   Procedure: Exploration of Lumbar Fusion,Removal of hardware Lumbar One-Two ;Redo Posterior Lumbar Fusion Lumbar One-Two;  Surgeon: Kary Kos, MD;  Location: [facility];  Service: Neurosurgery;  Laterality: N/A;  . LAPAROSCOPIC CHOLECYSTECTOMY  01-11-2006  . LAPAROSCOPY N/A 07/06/2016   Procedure: LAPAROSCOPY DIAGNOSTIC EMERGENT TO OPEN;  Surgeon: Greer Pickerel, MD;  Location: [facility];  Service: General;  Laterality: N/A;  . LAPAROTOMY N/A 07/06/2016   Procedure: EXPLORATORY LAPAROTOMY LYSIS OF ADHESIONS FOR 3 HOURS;  Surgeon: Greer Pickerel, MD;  Location: [facility];  Service: General;  Laterality: N/A;  . LUMBAR FUSION  2014   L4 -- L5  . MASS EXCISION Left 02/20/2013   Procedure: EXCISION LEFT BUTTOCK  MASS;  Surgeon: Adin Hector, MD;  Location: [facility];  Service: General;  Laterality: Left;  . NOSE SURGERY  2007  . REMOVAL HARDWARE L4-L5/  BILATERAL LAMINECTOMY L2 - L5 AND FUSION  06-12-2011  . SHOULDER OPEN ROTATOR CUFF REPAIR Left 05/05/2014   Procedure: OPEN ACROMIONECTOMY AND OPEN REPAIR OF ROTATOR CUFF, TISSUEMEND GRAFT WITH ANCHOR ;  Surgeon: Tobi Bastos, MD;  Location: [facility];  Service: Orthopedics;  Laterality: Left;  . SPINE SURGERY    . TONSILLECTOMY     Social History:   reports that she quit smoking about 47 years ago. She has never used smokeless tobacco. She reports that  she drinks alcohol. She reports that she does not use drugs.  Family History  Problem Relation Age of Onset  . Hypertension Mother   . Cancer Mother     cervix  . Kidney disease Mother   . Hypertension Sister   . Cancer Sister     polyps  . Kidney disease Brother   . Cancer Daughter     leukemia  . Hypertension Brother     Medications:   Medication List       Accurate as of 07/19/16 12:49 PM. Always use your most recent med list.          acetaminophen 500 MG tablet Commonly known as:  TYLENOL Take 2 tablets (1,000 mg total) by mouth every 8 (eight) hours as needed (pain).   CALCIUM-VITAMIN D3 PO Take 1 tablet by mouth daily.   cloNIDine 0.1 MG tablet Commonly known as:  CATAPRES Take 0.1 mg by mouth 3 (three) times daily.   DECUBI-VITE PO Take 1 tablet by mouth daily.   diphenhydrAMINE 25 mg  capsule Commonly known as:  BENADRYL Take 25 mg by mouth 2 (two) times daily as needed for allergies.   estradiol 0.1 MG/GM vaginal cream Commonly known as:  ESTRACE Place 1 Applicatorful vaginally 2 (two) times a week. Tuesdays and Thursdays   fluticasone 50 MCG/ACT nasal spray Commonly known as:  FLONASE Place 1 spray into both nostrils daily.   gabapentin 300 MG capsule Commonly known as:  NEURONTIN Take 300 mg by mouth 2 (two) times daily.   hydrALAZINE 25 MG tablet Commonly known as:  APRESOLINE Take 25 mg by mouth 3 (three) times daily.   ICY HOT EX Apply 1 application topically 2 (two) times daily as needed (pain). Gel   meloxicam 15 MG tablet Commonly known as:  MOBIC Take 15 mg by mouth daily.   METANX 3-90.314-2-35 MG Caps Take 1 capsule by mouth 2 (two) times daily.   metoprolol 50 MG tablet Commonly known as:  LOPRESSOR Take 1 tablet (50 mg total) by mouth 2 (two) times daily.   oxyCODONE 5 MG immediate release tablet Commonly known as:  Oxy IR/ROXICODONE Take 1-2 tablets (5-10 mg total) by mouth every 4 (four) hours as needed for moderate  pain.   pantoprazole 40 MG tablet Commonly known as:  PROTONIX Take 40 mg by mouth daily.   PARoxetine 40 MG tablet Commonly known as:  PAXIL Take 40 mg by mouth daily. Reported on 01/18/2016   polyethylene glycol packet Commonly known as:  MIRALAX / GLYCOLAX Take 17 g by mouth daily as needed (constipation).   sennosides-docusate sodium 8.6-50 MG tablet Commonly known as:  SENOKOT-S Take 2 tablets by mouth daily.   silodosin 8 MG Caps capsule Commonly known as:  RAPAFLO Take 8 mg by mouth daily as needed (difficulty urinating).   UNABLE TO FIND Med Name: Med pass 90 mL 2 times daily   VITAMIN B COMPLEX PO Take 1 tablet by mouth daily.   vitamin E 400 UNIT capsule Take 400 Units by mouth daily.       Immunizations: Immunization History  Administered Date(s) Administered  . Influenza-Unspecified 06/26/2016  . PPD Test 10/03/2015, 01/06/2016, 01/16/2016, 02/01/2016, 02/15/2016     Physical Exam:  Vitals:   07/19/16 1233  BP: (!) 111/55  Pulse: 75  Resp: 20  Temp: 98.1 F (36.7 C)  TempSrc: Oral  SpO2: 96%  Weight: 274 lb (124.3 kg)  Height: 5\' 5"  (1.651 m)   Body mass index is 45.6 kg/m.  General- elderly female, morbidly obese, in no acute distress Head- normocephalic, atraumatic Nose-  no maxillary or frontal sinus tenderness, no nasal discharge Throat- moist mucus membrane Eyes- PERRLA, EOMI, no pallor, no icterus, no discharge, normal conjunctiva, normal sclera Neck- no cervical lymphadenopathy Cardiovascular- normal s1,s2, no murmur, trace leg edema Respiratory- bilateral clear to auscultation, no wheeze, no rhonchi, no crackles, no use of accessory muscles Abdomen- bowel sounds present, soft, generalized abdominal wall tenderness, no guarding or rigidity, no CVA tenderness Musculoskeletal- able to move all 4 extremities, generalized weakness Neurological-  alert and oriented to person, place and time Skin- warm and dry, surgical incision to mid  abdominal wall with sanguineous drainage and erythema and induration to incisional site and staples present, 3 small abdominal wall incision with staples, surgical incision to lumbar region healing well, bruise to left arm, stage 2 pressure ulcer to left buttock Psychiatry- normal mood and affect    Labs reviewed: Basic Metabolic Panel:  Recent Labs  07/11/16 0433 07/11/16 2000 07/12/16 0450 07/13/16  0500  NA 141 142 141 139  K 2.6* 3.1* 3.4* 3.6  CL 105 109 107 105  CO2 25 27 28 29   GLUCOSE 91 106* 132* 107*  BUN 13 14 13 12   CREATININE 0.77 0.61 0.55 0.57  CALCIUM 8.4* 8.3* 8.1* 8.0*  MG 1.9  --  2.0 2.0  PHOS 4.0  --  2.6 2.9   Liver Function Tests:  Recent Labs  07/10/16 1407 07/11/16 0433 07/12/16 0450  AST 34 38 28  ALT 32 37 33  ALKPHOS 88 94 89  BILITOT 0.7 0.6 0.4  PROT 6.8 6.5 6.0*  ALBUMIN 2.3* 2.1* 2.1*    Recent Labs  07/02/16 1650 07/03/16 0627 07/04/16 0318  LIPASE 3,627* 627* 61*   No results for input(s): AMMONIA in the last 8760 hours. CBC:  Recent Labs  07/02/16 1650 07/03/16 0627  07/08/16 0554 07/10/16 1407 07/11/16 0433  WBC 10.1 4.5  < > 11.3* 12.2* 11.5*  NEUTROABS 8.2* 3.0  --   --   --  9.0*  HGB 15.4* 14.5  < > 13.1 12.5 11.2*  HCT 45.4 44.1  < > 38.5 37.0 33.5*  MCV 91.3 91.9  < > 90.4 89.6 89.8  PLT 323 307  < > 232 291 275  < > = values in this interval not displayed. Cardiac Enzymes:  Recent Labs  07/10/16 1907 07/10/16 2231 07/11/16 0433  TROPONINI <0.03 0.09* 0.04*   BNP: Invalid input(s): POCBNP CBG:  Recent Labs  07/16/16 0415 07/16/16 0752 07/16/16 1138  GLUCAP 96 90 99    Radiological Exams: Dg Chest 2 View  Result Date: 07/02/2016 CLINICAL DATA:  Mid lower chest and left sided chest pain up into left shoulder. Severe abd pain and tightness. Dizziness. N/V. SOB. Rapid heartbeat. Skin feels cold - almost clammy, but she states she is burning up. Symptoms have increased in severity and frequency  over the last week. EXAM: CHEST  2 VIEW COMPARISON:  01/05/2016 FINDINGS: Lung volumes are low. There is linear/streaky type opacity at the lung bases there is most likely atelectasis. Infection is possible. Remainder of the lungs is clear. No pleural effusion or pneumothorax. Cardiac silhouette is normal in size. No mediastinal or hilar masses. No evidence of adenopathy. Skeletal structures are intact. IMPRESSION: 1. Lung base opacity accentuated by low lung volumes. This is most likely atelectasis. Consider pneumonia if there are consistent clinical symptoms. 2. No other evidence of acute cardiopulmonary disease. Electronically Signed   By: Lajean Manes M.D.   On: 07/02/2016 16:38   Dg Cervical Spine Complete  Result Date: 06/28/2016 CLINICAL DATA:  Chronic neck pain, no known injury, initial encounter EXAM: CERVICAL SPINE - COMPLETE 4+ VIEW COMPARISON:  None. FINDINGS: Seven cervical segments are well visualized. Vertebral body height is well maintained. No prevertebral soft tissue changes are seen. Mild facet hypertrophic changes are noted. No significant neural foraminal narrowing is seen. The odontoid is within normal limits. IMPRESSION: Mild degenerative change without acute abnormality. Electronically Signed   By: Inez Catalina M.D.   On: 06/28/2016 08:38   Dg Lumbar Spine Complete W/bend  Result Date: 06/28/2016 CLINICAL DATA:  Low back pain, no known injury, initial encounter EXAM: LUMBAR SPINE - COMPLETE WITH BENDING VIEWS COMPARISON:  05/22/2016 FINDINGS: Multilevel postoperative changes are noted throughout the lumbar spine. The overall appearance of the hardware is stable. Vertebral body height is well maintained. Aortic calcifications are seen. Flexion and extension views show no definitive instability. IMPRESSION: Multilevel postoperative changes.  No instability is seen on flexion and extension. No acute abnormality is noted. Electronically Signed   By: Inez Catalina M.D.   On: 06/28/2016  08:40   Dg Si Joints  Result Date: 06/28/2016 CLINICAL DATA:  Chronic pain.  No reported injury . EXAM: BILATERAL SACROILIAC JOINTS - 3+ VIEW COMPARISON:  CT 06/01/2016.  MRI 02/08/2016 FINDINGS: Lumbosacral spine fusion. Degenerative changes lumbar spine and both SI joints. Degenerative changes both hips. Osteitis pubis again noted. No acute abnormality identified. Pelvic calcifications consistent phleboliths noted. IMPRESSION: Lumbosacral spine fusion. Degenerative changes lumbar spine, both SI joints, both hips. Osteitis pubis again noted. No acute abnormality identified. Electronically Signed   By: Marcello Moores  Register   On: 06/28/2016 06:53   Dg Shoulder Right  Result Date: 06/28/2016 CLINICAL DATA:  Chronic pain.  No recent prior. EXAM: RIGHT SHOULDER - 2+ VIEW COMPARISON:  01/05/2016. FINDINGS: Acromioclavicular glenohumeral degenerative change. Subacromial spurring is noted. No evidence of fracture or dislocation. IMPRESSION: 1.  No acute abnormality. 2. Degenerative changes right shoulder with subacromial spurring. Electronically Signed   By: Marcello Moores  Register   On: 06/28/2016 06:47   Dg Knee 1-2 Views Left  Result Date: 06/28/2016 CLINICAL DATA:  Knee pain common no known injury, initial encounter EXAM: LEFT KNEE - 1-2 VIEW COMPARISON:  None. FINDINGS: No acute fracture or dislocation is noted. No joint effusion is seen. Very minimal medial joint space narrowing is noted. IMPRESSION: Minimal degenerative change without acute abnormality. Electronically Signed   By: Inez Catalina M.D.   On: 06/28/2016 08:38   Dg Knee 1-2 Views Right  Result Date: 06/28/2016 CLINICAL DATA:  Right knee pain, no known injury, initial encounter EXAM: RIGHT KNEE - 1-2 VIEW COMPARISON:  None. FINDINGS: No evidence of fracture, dislocation, or joint effusion. Mild medial joint space narrowing is noted. No soft tissue abnormality is seen. IMPRESSION: Minimal degenerative changes. Electronically Signed   By: Inez Catalina  M.D.   On: 06/28/2016 08:39   Ct Angio Chest Pe W Or Wo Contrast  Result Date: 07/07/2016 CLINICAL DATA:  Exploratory laparotomy yesterday. Shortness of breath. Evaluate for pulmonary embolus. EXAM: CT ANGIOGRAPHY CHEST WITH CONTRAST TECHNIQUE: Multidetector CT imaging of the chest was performed using the standard protocol during bolus administration of intravenous contrast. Multiplanar CT image reconstructions and MIPs were obtained to evaluate the vascular anatomy. CONTRAST:  100 cc Isovue 370 COMPARISON:  None. FINDINGS: Cardiovascular: Heart size upper normal. No pericardial effusion. No thoracic aortic aneurysm. Assessment of segmental and subsegmental pulmonary arteries to the lower lobes is degraded by respiratory motion, but within this limitation there is no CT evidence for an acute pulmonary embolus. Mediastinum/Nodes: No mediastinal lymphadenopathy. There is no hilar lymphadenopathy. There is no axillary lymphadenopathy. NG tube passes into the stomach with nonvisualization of the distal tip. Lungs/Pleura: Bilateral lower lobe subsegmental atelectasis noted. Focal bandlike opacity in the lingula likely also atelectatic. 7 mm ground-glass nodule identified right lower lobe (image 59 series 5). No evidence for pneumothorax. No pleural effusion. Upper Abdomen: Stomach is fluid distended despite the presence of an NG tube. Musculoskeletal: Bone windows reveal no worrisome lytic or sclerotic osseous lesions. Review of the MIP images confirms the above findings. IMPRESSION: 1. No CT evidence for acute pulmonary embolus. 2. Bilateral lower lobe subsegmental atelectasis with probable lingular atelectasis associated. 3. Stomach is fluid distended despite the presence of an NG tube. Electronically Signed   By: Misty Stanley M.D.   On: 07/07/2016 14:31   Ct Abdomen Pelvis  W Contrast  Result Date: 07/05/2016 CLINICAL DATA:  70 year old female with recently diagnosed small bowel obstruction and pancreatitis.  Continued abdominal pelvic pain with nausea. EXAM: CT ABDOMEN AND PELVIS WITH CONTRAST TECHNIQUE: Multidetector CT imaging of the abdomen and pelvis was performed using the standard protocol following bolus administration of intravenous contrast. CONTRAST:  100 cc intravenous Isovue 300 COMPARISON:  07/02/2016 and prior CTs. FINDINGS: Lower chest: Increased moderate bibasilar atelectasis identified. Hepatobiliary: The liver is unremarkable. The patient is status post cholecystectomy. There is no evidence of biliary dilatation. Pancreas: Unremarkable Spleen: Unremarkable Adrenals/Urinary Tract: The kidneys, adrenal glands and bladder are unremarkable except for unchanged 1.7 cm left adrenal adenoma. Stomach/Bowel: An NG tube is present with tip in the mid stomach. Dilated proximal-mid small bowel loops are again identified with collapsed distal small bowel loops, compatible with small bowel obstruction. There has been interval decreased caliber of stomach and dilated small bowel loops since the prior study. There is no evidence of definite bowel wall thickening or pneumoperitoneum. Vascular/Lymphatic: Abdominal aortic atherosclerotic calcifications noted without aneurysm. No enlarged lymph nodes identified. Reproductive: Patient is status post hysterectomy. No adnexal masses identified. Other: No free fluid or focal collection/abscess. Musculoskeletal: Surgical changes within the lumbar spine again identified. No acute or suspicious abnormality. IMPRESSION: Small bowel obstruction again noted, with decompression of stomach and decreased caliber of dilated small bowel loops from NG tube within the stomach. No evidence of abscess, free fluid or pneumoperitoneum. Increased moderate bibasilar atelectasis. Abdominal aortic atherosclerosis. Electronically Signed   By: Margarette Canada M.D.   On: 07/05/2016 13:26   Ct Abdomen Pelvis W Contrast  Result Date: 07/02/2016 CLINICAL DATA:  Epigastric abdominal pain for 5 days.  Vomiting starting last night. Prior hysterectomy. Prior abdominal adhesion removal. EXAM: CT ABDOMEN AND PELVIS WITH CONTRAST TECHNIQUE: Multidetector CT imaging of the abdomen and pelvis was performed using the standard protocol following bolus administration of intravenous contrast. CONTRAST:  145mL ISOVUE-300 IOPAMIDOL (ISOVUE-300) INJECTION 61% COMPARISON:  01/30/2016 FINDINGS: Lower chest: Bibasilar atelectasis. Normal heart size without pericardial or pleural effusion. Fluid in the distal esophagus. Hepatobiliary: Normal liver. Cholecystectomy, without biliary ductal dilatation. Pancreas: Normal, without mass or ductal dilatation. Spleen: Normal in size, without focal abnormality. Adrenals/Urinary Tract: Normal right adrenal gland. Left adrenal nodule measures 1.7 cm and is been similar in back to 06/23/2015, consistent with an adenoma on that study. Normal kidneys, without hydronephrosis. Normal urinary bladder. Stomach/Bowel: The stomach is distended and fluid-filled. The colon is normal to minimally decompressed distally. Decompressed terminal and distal ileum. Proximal and mid small bowel loops are dilated, including at 3.7 cm on image 56/series 2. Relatively gradual transition occurs, including within left paracentral anteriorly positioned small bowel loops on image 52/series 2. No obstructive mass. No findings to suggest complicating ischemia. Minimal nonspecific mesenteric interloop edema is seen including on image 56/series 2. Vascular/Lymphatic: Advanced aortic and branch vessel atherosclerosis. No abdominopelvic adenopathy. Reproductive: Hysterectomy.  No adnexal mass. Other: No significant free fluid. No pneumatosis or free intraperitoneal air. Musculoskeletal: Lumbosacral spine fixation. IMPRESSION: 1. Mid small bowel obstruction, likely secondary to adhesions. No complicating ischemia. 2. Distended, fluid-filled stomach with fluid in the esophagus. The patient would likely benefit from nasogastric  tube placement. 3. Advanced aortic atherosclerosis. 4. Left adrenal adenoma. Electronically Signed   By: Abigail Miyamoto M.D.   On: 07/02/2016 20:44   Dg Chest Port 1 View  Result Date: 07/10/2016 CLINICAL DATA:  Shortness of breath, hypertension, asthma EXAM: PORTABLE CHEST 1 VIEW COMPARISON:  07/07/2016 FINDINGS: Cardiomediastinal silhouette is stable. Mild basilar atelectasis. No pulmonary edema. NG tube in place. Linear atelectasis or infiltrate in lingula. IMPRESSION: No pulmonary edema. Mild basilar atelectasis. Linear atelectasis or infiltrate in lingula. Electronically Signed   By: Lahoma Crocker M.D.   On: 07/10/2016 11:07   Dg Chest Port 1 View  Result Date: 07/05/2016 CLINICAL DATA:  Chest pain EXAM: PORTABLE CHEST 1 VIEW COMPARISON:  07/02/2016 FINDINGS: Cardiomediastinal silhouette is stable. Stable atelectasis or scarring right base. There is streaky atelectasis or infiltrate left base. No pulmonary edema. NG tube poorly visualized due to patient's large body habitus. IMPRESSION: No pulmonary edema. Right base atelectasis or scarring. Streaky left basilar atelectasis or infiltrate. Electronically Signed   By: Lahoma Crocker M.D.   On: 07/05/2016 12:28   Dg Shoulder Left  Result Date: 06/28/2016 CLINICAL DATA:  Chronic pain.  No reported injury. EXAM: LEFT SHOULDER - 2+ VIEW COMPARISON:  08/16/2009. FINDINGS: No acute bony or joint abnormality identified. No evidence of fracture or dislocation. Mild acromioclavicular and glenohumeral degenerative change. IMPRESSION: No acute or focal abnormality. Mild degenerative changes left shoulder . Electronically Signed   By: Marcello Moores  Register   On: 06/28/2016 06:47   Dg Abd Portable 1v  Result Date: 07/05/2016 CLINICAL DATA:  Small-bowel obstruction. EXAM: PORTABLE ABDOMEN - 1 VIEW COMPARISON:  07/04/2016 FINDINGS: NG tube in the stomach. Stomach is decompressed. No dilated loops of bowel identified. No bowel wall edema. Lumbar fusion hardware unchanged.  IMPRESSION: Nonobstructive bowel gas pattern.  NG tube remains in the stomach. Electronically Signed   By: Franchot Gallo M.D.   On: 07/05/2016 08:43   Dg Abd Portable 1v-small Bowel Obstruction Protocol-24 Hr Delay  Result Date: 07/04/2016 CLINICAL DATA:  Delayed image for assessment of small bowel obstruction. EXAM: PORTABLE ABDOMEN - 1 VIEW COMPARISON:  07/03/2016 FINDINGS: Contrast injected into the stomach under previous exam is no longer visualized. It has passed. There is no evidence of small-bowel obstruction. Nasogastric tube is well positioned within the mid stomach. Bowel gas pattern is unremarkable. IMPRESSION: 1. No evidence of a small-bowel obstruction. Electronically Signed   By: Lajean Manes M.D.   On: 07/04/2016 09:15   Dg Abd Portable 1v-small Bowel Obstruction Protocol-initial, 8 Hr Delay  Result Date: 07/03/2016 CLINICAL DATA:  70 year old female with small bowel obstruction. Subsequent encounter. EXAM: PORTABLE ABDOMEN - 1 VIEW COMPARISON:  07/02/2016 plain film and CT. FINDINGS: Nasogastric tube with tip gastric body level. Contrast and gas-filled dilated stomach. Gas-filled small bowel loops. The caliber of the small bowel loops is not adequately assessed by plain film examination (as small bowel loops may be more fluid-filled than gas-filled when compared to prior exam) and small bowel obstructive pattern may still be present. The possibility of free intraperitoneal air cannot be assessed on a supine view. Postsurgical changes lumbar spine. IMPRESSION: Contrast and gas-filled dilated stomach. Gas-filled small bowel loops. The caliber of the small bowel loops is not adequately assessed by plain film examination (as small bowel loops may be more fluid-filled than gas-filled when compared to prior exam) and small bowel obstructive pattern may still be present. Electronically Signed   By: Genia Del M.D.   On: 07/03/2016 15:51   Dg Abd Portable 1 View  Result Date:  07/02/2016 CLINICAL DATA:  NG tube placement. EXAM: PORTABLE ABDOMEN - 1 VIEW COMPARISON:  CT abdomen/ pelvis earlier this day FINDINGS: Tip and side port of the enteric tube below the diaphragm in the stomach. Gaseous gastric distention.  Small bowel dilatation is partially included. Bibasilar atelectasis is seen. IMPRESSION: Tip and side port of the enteric tube below the diaphragm in the stomach. Electronically Signed   By: Jeb Levering M.D.   On: 07/02/2016 23:25   Dg Hip Unilat W Or W/o Pelvis 2-3 Views Left  Result Date: 06/28/2016 CLINICAL DATA:  Chronic pain. EXAM: DG HIP (WITH OR WITHOUT PELVIS) 2-3V LEFT COMPARISON:  05/22/2016.  MRI 02/08/2016. FINDINGS: Prior lumbosacral spine fusion. Degenerative changes lumbar spine, both SI joints, both hips. Osteitis pubis again noted. IMPRESSION: No acute abnormality. Prior lumbosacral spine fusion. Degenerative changes both SI joints and hips. Osteitis pubis again noted. Electronically Signed   By: Marcello Moores  Register   On: 06/28/2016 06:50   Dg Hip Unilat W Or W/o Pelvis 2-3 Views Right  Result Date: 06/28/2016 CLINICAL DATA:  Chronic pain.  No reported injury. EXAM: DG HIP (WITH OR WITHOUT PELVIS) 2-3V RIGHT COMPARISON:  CT 06/01/2016.  MRI 02/08/2016. FINDINGS: Lumbosacral spine fusion. Degenerative changes lumbar spine, both SI joints, both hips. Osteitis pubis again noted. No acute abnormality. No evidence of fracture dislocation. IMPRESSION: Lumbosacral spine fusion. Degenerative changes lumbar spine, both SI joints, both hips. Osteitis pubis again noted. No acute bony abnormality identified . Electronically Signed   By: Marcello Moores  Register   On: 06/28/2016 06:51   US Abdomen Limited Ruq  Result Date: 07/03/2016 CLINICAL DATA:  Elevated LFTs.  Initial encounter. EXAM: US ABDOMEN LIMITED - RIGHT UPPER QUADRANT COMPARISON:  CT of the abdomen and pelvis from 07/02/2016 FINDINGS: Gallbladder: Status post cholecystectomy.  No retained stones seen. Common  bile duct: Diameter: 0.3 cm, within normal limits in caliber. Difficult to fully characterize due to overlying structures. Liver: No focal lesion identified. Within normal limits in parenchymal echogenicity. IMPRESSION: Status post cholecystectomy. No retained stones seen. Unremarkable ultrasound of the right upper quadrant. On further assessment of the recent CT of the abdomen and pelvis, there is soft tissue inflammation tracking about the pancreatic head and second segment of the duodenum. Given clinical concern, this could reflect mild acute pancreatitis. There is no evidence of devascularization or pseudocyst formation on the recent prior CT. Note that the pancreas is typically not well assessed on ultrasound. Electronically Signed   By: Garald Balding M.D.   On: 07/03/2016 03:58    Assessment/Plan  Physical deconditioning Will have her work with physical therapy and occupational therapy team to help with gait training and muscle strengthening exercises.fall precautions. Skin care. Encourage to be out of bed.   Small bowel obstruction S/p ex lap and adhesion lysis. Will need follow up with surgery. Monitor for bowel movement. Continue tylenol 1000 mg q8h prn pain and oxyIR 5 mg 1-2 tab q4h prn pain. Monitor bowel movement  Constipation Has not had a bowel movement for 4 days. Passing flatus. No guarding or rigidity noted. Her limited mobility, pain medication and poor po intake could all be contributing to this. Currently on senokot s 2 tab daily and miralax daily as needed. Encouraged hydration. Change senna s to 2 tab bid and miralax to daily and monitor  Surgical site infection Monitor clinically. Send wound culture. Start doxycycline 100 mg q12h x 1 week. Will need surgery team to see her sooner. Continue to provide wound care.   Blood loss anemia Post op, monitor cbc  Leukocytosis Surgical incision site appears infected. This could be contributing to this. Check cbc with diff. Monitor  temp curve.  Stage 2 pressure ulcer Provide wound care. Start  decubivite. Pressure ulcer prophylaxis  Urinary hesitancy Continue rapaflo as needed  Chronic depression Stable mood at present. Continue paroxetine  gerd Worsened. Change her protonix to 40 mg bid for now and monitor  Neuropathic pain On gabapentin 300 mg bid, no changes made  HTN Monitor BP, continue hydralazine 25 mg tid, clonidine 0.1 mg tid and lopressor 50 mg bid. Check bmp  Chronic back pain On oxyIR 5 mg 1-2 tab q4h prn pain and tylenol as needed. Monitor clinically.   Dysphagia SLP to follow   Goals of care: short term rehabilitation   Labs/tests ordered: cbc, cmp 07/20/16  Family/ staff Communication: reviewed care plan with patient and nursing supervisor    Blanchie Serve, MD Internal Medicine [place], [place] [address] Cell Phone (Monday-Friday 8 am - 5 pm): [phone] On Call: [phone] and follow prompts after 5 pm and on weekends Office Phone: [phone] Office Fax: [phone]

## 2016-07-20 ENCOUNTER — Other Ambulatory Visit: Payer: Self-pay | Admitting: *Deleted

## 2016-07-20 ENCOUNTER — Other Ambulatory Visit: Payer: Self-pay

## 2016-07-20 ENCOUNTER — Encounter (HOSPITAL_COMMUNITY): Payer: Self-pay

## 2016-07-20 ENCOUNTER — Emergency Department (HOSPITAL_COMMUNITY): Payer: Medicare Other

## 2016-07-20 ENCOUNTER — Inpatient Hospital Stay (HOSPITAL_COMMUNITY)
Admission: EM | Admit: 2016-07-20 | Discharge: 2016-08-01 | DRG: 856 | Disposition: A | Discharge status: Skilled Nursing Facility | Payer: Medicare Other | Attending: Family Medicine | Admitting: Family Medicine

## 2016-07-20 DIAGNOSIS — Z90710 Acquired absence of both cervix and uterus: Secondary | ICD-10-CM

## 2016-07-20 DIAGNOSIS — Z66 Do not resuscitate: Secondary | ICD-10-CM | POA: Diagnosis present

## 2016-07-20 DIAGNOSIS — J9600 Acute respiratory failure, unspecified whether with hypoxia or hypercapnia: Secondary | ICD-10-CM | POA: Diagnosis present

## 2016-07-20 DIAGNOSIS — Z8249 Family history of ischemic heart disease and other diseases of the circulatory system: Secondary | ICD-10-CM

## 2016-07-20 DIAGNOSIS — I739 Peripheral vascular disease, unspecified: Secondary | ICD-10-CM | POA: Diagnosis present

## 2016-07-20 DIAGNOSIS — K56609 Unspecified intestinal obstruction, unspecified as to partial versus complete obstruction: Secondary | ICD-10-CM | POA: Diagnosis present

## 2016-07-20 DIAGNOSIS — T8130XA Disruption of wound, unspecified, initial encounter: Secondary | ICD-10-CM | POA: Diagnosis present

## 2016-07-20 DIAGNOSIS — Z806 Family history of leukemia: Secondary | ICD-10-CM

## 2016-07-20 DIAGNOSIS — K56600 Partial intestinal obstruction, unspecified as to cause: Secondary | ICD-10-CM | POA: Diagnosis present

## 2016-07-20 DIAGNOSIS — G4733 Obstructive sleep apnea (adult) (pediatric): Secondary | ICD-10-CM | POA: Diagnosis present

## 2016-07-20 DIAGNOSIS — L03311 Cellulitis of abdominal wall: Secondary | ICD-10-CM | POA: Diagnosis not present

## 2016-07-20 DIAGNOSIS — IMO0001 Reserved for inherently not codable concepts without codable children: Secondary | ICD-10-CM

## 2016-07-20 DIAGNOSIS — Z87891 Personal history of nicotine dependence: Secondary | ICD-10-CM

## 2016-07-20 DIAGNOSIS — R0602 Shortness of breath: Secondary | ICD-10-CM | POA: Diagnosis not present

## 2016-07-20 DIAGNOSIS — F329 Major depressive disorder, single episode, unspecified: Secondary | ICD-10-CM | POA: Diagnosis present

## 2016-07-20 DIAGNOSIS — F419 Anxiety disorder, unspecified: Secondary | ICD-10-CM | POA: Diagnosis present

## 2016-07-20 DIAGNOSIS — J9601 Acute respiratory failure with hypoxia: Secondary | ICD-10-CM | POA: Diagnosis not present

## 2016-07-20 DIAGNOSIS — J45909 Unspecified asthma, uncomplicated: Secondary | ICD-10-CM | POA: Diagnosis present

## 2016-07-20 DIAGNOSIS — T814XXA Infection following a procedure, initial encounter: Secondary | ICD-10-CM | POA: Diagnosis not present

## 2016-07-20 DIAGNOSIS — G8929 Other chronic pain: Secondary | ICD-10-CM | POA: Diagnosis present

## 2016-07-20 DIAGNOSIS — D649 Anemia, unspecified: Secondary | ICD-10-CM | POA: Diagnosis present

## 2016-07-20 DIAGNOSIS — M797 Fibromyalgia: Secondary | ICD-10-CM | POA: Diagnosis present

## 2016-07-20 DIAGNOSIS — G9340 Encephalopathy, unspecified: Secondary | ICD-10-CM | POA: Diagnosis present

## 2016-07-20 DIAGNOSIS — I10 Essential (primary) hypertension: Secondary | ICD-10-CM | POA: Diagnosis present

## 2016-07-20 DIAGNOSIS — K219 Gastro-esophageal reflux disease without esophagitis: Secondary | ICD-10-CM | POA: Diagnosis present

## 2016-07-20 DIAGNOSIS — G894 Chronic pain syndrome: Secondary | ICD-10-CM | POA: Diagnosis present

## 2016-07-20 DIAGNOSIS — Y848 Other medical procedures as the cause of abnormal reaction of the patient, or of later complication, without mention of misadventure at the time of the procedure: Secondary | ICD-10-CM | POA: Diagnosis present

## 2016-07-20 DIAGNOSIS — R935 Abnormal findings on diagnostic imaging of other abdominal regions, including retroperitoneum: Secondary | ICD-10-CM | POA: Diagnosis not present

## 2016-07-20 DIAGNOSIS — K5900 Constipation, unspecified: Secondary | ICD-10-CM | POA: Diagnosis present

## 2016-07-20 DIAGNOSIS — H409 Unspecified glaucoma: Secondary | ICD-10-CM | POA: Diagnosis present

## 2016-07-20 DIAGNOSIS — Z981 Arthrodesis status: Secondary | ICD-10-CM

## 2016-07-20 DIAGNOSIS — Z841 Family history of disorders of kidney and ureter: Secondary | ICD-10-CM

## 2016-07-20 DIAGNOSIS — Z86718 Personal history of other venous thrombosis and embolism: Secondary | ICD-10-CM

## 2016-07-20 LAB — BASIC METABOLIC PANEL
BUN: 9 mg/dL (ref 4–21)
Creatinine: 0.6 mg/dL (ref 0.5–1.1)
Glucose: 106 mg/dL
Potassium: 3.8 mmol/L (ref 3.4–5.3)
Sodium: 138 mmol/L (ref 137–147)

## 2016-07-20 LAB — CBC AND DIFFERENTIAL
HCT: 28 % — AB (ref 36–46)
Hemoglobin: 9.2 g/dL — AB (ref 12.0–16.0)
Platelets: 524 10*3/uL — AB (ref 150–399)
WBC: 6.2 10^3/mL

## 2016-07-20 LAB — HEPATIC FUNCTION PANEL
ALT: 22 U/L (ref 7–35)
AST: 13 U/L (ref 13–35)
Alkaline Phosphatase: 98 U/L (ref 25–125)
Bilirubin, Total: 0.3 mg/dL

## 2016-07-20 MED ORDER — OXYCODONE HCL 5 MG PO TABS: 5.0000 mg | ORAL_TABLET | ORAL | 0 refills | Status: DC | PRN

## 2016-07-20 MED ORDER — METRONIDAZOLE IN NACL 5-0.79 MG/ML-% IV SOLN
500.0000 mg | Freq: Once | INTRAVENOUS | Status: AC
  Administered 2016-07-21: 500 mg via INTRAVENOUS
  Filled 2016-07-20: qty 100

## 2016-07-20 MED ORDER — VANCOMYCIN HCL IN DEXTROSE 1-5 GM/200ML-% IV SOLN
1000.0000 mg | Freq: Once | INTRAVENOUS | Status: AC
  Administered 2016-07-21: 1000 mg via INTRAVENOUS
  Filled 2016-07-20: qty 200

## 2016-07-20 MED ORDER — OXYCODONE HCL 5 MG PO TABS: ORAL_TABLET | ORAL | 0 refills | Status: DC

## 2016-07-20 MED ORDER — AZTREONAM 2 G IJ SOLR
2.0000 g | Freq: Once | INTRAMUSCULAR | Status: AC
  Administered 2016-07-21: 2 g via INTRAVENOUS
  Filled 2016-07-20: qty 2

## 2016-07-20 NOTE — ED Triage Notes (Signed)
Patient is coming by EMS from [facility] for abdominal pain after having surgery for bowel obstruction.  No obvious problems with the incision site.  Last bowel movement was yesterday.  Temp was 100.3

## 2016-07-20 NOTE — ED Provider Notes (Signed)
[facility] DEPT Provider Note   CSN: [id number] Arrival date & time: 07/20/16  2324  By signing my name below, I, Evelene Croon, attest that this documentation has been prepared under the direction and in the presence of Orpah Greek, MD. Electronically Signed: Evelene Croon, Scribe. 07/20/2016. 11:38 PM.   History   Chief Complaint Chief Complaint  Patient presents with  . Abdominal Pain    recent surgery    The history is provided by the patient. No language interpreter was used.     HPI Comments:  Amy [name] is a 70 y.o. female who presents to the Emergency Department 2 weeks s/p abdominal surgery complaining of increased drainage from incision site x 4 days.  She reports associated low grade fever today, occasional cough, and SOB x a few hours. She also reports diffuse abdominal pain and abdominal bloating. No alleviating factors noted. She denies vomiting. Her last BM was yesterday; her first since surgery. She has had some flatulence today. Pt arrived to the ED via EMS from [facility].   Past Medical History:  Diagnosis Date  . Abdominal pain of unknown etiology 02/05/2016  . Anxiety   . Arthritis    ddd- RA  . Asthma    "sleeping asthma"  . Chronic back pain    lumbar steroid injection's  . Constipation   . Depression   . Dysphonia    intermittent "voice changes"  . Fibromyalgia   . GERD (gastroesophageal reflux disease)   . Glaucoma   . Headache   . History of DVT (deep vein thrombosis) 1989   LEFT UPPER ARM  . History of hiatal hernia   . History of MRSA infection 2011  . Hypertension   . Irregular heart rate    "years ago"  . Low iron   . Mild obstructive sleep apnea    per study 02-07-2006 - no cpap  . Neuropathy (HCC)    feet  . Pelvic pain in female   . Peripheral vascular disease ([place])    "poor circulation"  . SBO (small bowel obstruction) 06/2016  . Seasonal allergies   . Short of breath on exertion   . Urinary  frequency   . Weakness    both hands and feet    Patient Active Problem List   Diagnosis Date Noted  . Pancreatitis 07/02/2016  . SBO (small bowel obstruction) 07/02/2016  . Long term current use of opiate analgesic 06/26/2016  . Long term prescription opiate use 06/26/2016  . Opiate use 06/26/2016  . Encounter for therapeutic drug level monitoring 06/26/2016  . Encounter for pain management planning 06/26/2016  . Chronic pain 06/26/2016  . Disturbance of skin sensation 06/26/2016  . Chronic low back pain (Location of Primary Source of Pain) (Bilateral) (R>L) 06/26/2016  . Chronic hip pain (Location of Secondary source of pain) (Bilateral) (R>L) 06/26/2016  . 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 Chronic shoulder pain (Location of Tertiary source of pain) (Bilateral) (R>L) 06/26/2016  . 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 Chronic knee pain (Bilateral) (R>L) 06/26/2016  . 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 Chronic neck pain (Right) 06/26/2016  . Lumbar spondylosis 06/26/2016  . Lumbar facet syndrome 06/26/2016  . Lumbar facet hypertrophy 06/26/2016  . 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 Neurogenic pain 06/26/2016  . Occipital headaches 06/26/2016  . 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 History of lumbar fusion 06/26/2016  .  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  . Neuropathic pain of both legs 10/13/2015  . Constipation 10/13/2015  . Complete rotator cuff tear of left shoulder 05/05/2014  . Depression 08/06/2013  . Hypokalemia 08/06/2013  . GERD (gastroesophageal reflux disease) 08/06/2013  . Spinal stenosis of lumbar region 08/06/2013  . Bladder spasm 08/06/2013  . Obesity, Class III, BMI 40-49.9 (morbid obesity) ([place]) 02/03/2013  . Lipoma of buttock s/p excision 02/20/2013 02/03/2013  . DYSPNEA 01/23/2008  . OBSTRUCTIVE SLEEP APNEA 09/12/2007  . Essential  hypertension 09/12/2007  . Allergic rhinitis 09/12/2007  . SLEEPINESS 09/12/2007  . ANGINA, HX OF 09/12/2007    Past Surgical History:  Procedure Laterality Date  . abdominal adhesions removed    . ABDOMINAL HYSTERECTOMY  1978  . BUNIONECTOMY Left 2008  . COLONOSCOPY    . CYSTO WITH HYDRODISTENSION N/A 07/12/2015   Procedure: CYSTOSCOPY/HYDRODISTENSION;  Surgeon: Bjorn Loser, MD;  Location: [facility];  Service: Urology;  Laterality: N/A;  . ESOPHAGEAL MANOMETRY N/A 11/22/2014   Procedure: ESOPHAGEAL MANOMETRY (EM);  Surgeon: Winfield Cunas., MD;  Location: [facility];  Service: Endoscopy;  Laterality: N/A;  . ESOPHAGOGASTRODUODENOSCOPY N/A 07/15/2013   Procedure: ESOPHAGOGASTRODUODENOSCOPY (EGD);  Surgeon: Winfield Cunas., MD;  Location: [facility];  Service: Endoscopy;  Laterality: N/A;  need xray  . EYE SURGERY Bilateral    cataract surgery with lens implants  . HARDWARE REMOVAL N/A 01/02/2016   Procedure: Exploration of Lumbar Fusion,Removal of hardware Lumbar One-Two ;Redo Posterior Lumbar Fusion Lumbar One-Two;  Surgeon: Kary Kos, MD;  Location: [facility];  Service: Neurosurgery;  Laterality: N/A;  . LAPAROSCOPIC CHOLECYSTECTOMY  01-11-2006  . LAPAROSCOPY N/A 07/06/2016   Procedure: LAPAROSCOPY DIAGNOSTIC EMERGENT TO OPEN;  Surgeon: Greer Pickerel, MD;  Location: [facility];  Service: General;  Laterality: N/A;  . LAPAROTOMY N/A 07/06/2016   Procedure: EXPLORATORY LAPAROTOMY LYSIS OF ADHESIONS FOR 3 HOURS;  Surgeon: Greer Pickerel, MD;  Location: [facility];  Service: General;  Laterality: N/A;  . LUMBAR FUSION  2014   L4 -- L5  . MASS EXCISION Left 02/20/2013   Procedure: EXCISION LEFT BUTTOCK  MASS;  Surgeon: Adin Hector, MD;  Location: [facility];  Service: General;  Laterality: Left;  . NOSE SURGERY  2007  . REMOVAL HARDWARE L4-L5/  BILATERAL LAMINECTOMY L2 - L5 AND FUSION  06-12-2011  . SHOULDER OPEN ROTATOR CUFF REPAIR Left 05/05/2014   Procedure: OPEN  ACROMIONECTOMY AND OPEN REPAIR OF ROTATOR CUFF, TISSUEMEND GRAFT WITH ANCHOR ;  Surgeon: Tobi Bastos, MD;  Location: [facility];  Service: Orthopedics;  Laterality: Left;  . SPINE SURGERY    . TONSILLECTOMY      OB History    No data available       Home Medications    Prior to Admission medications   Medication Sig Start Date End Date Taking? Authorizing Provider  acetaminophen (TYLENOL) 500 MG tablet Take 2 tablets (1,000 mg total) by mouth every 8 (eight) hours as needed (pain). 07/16/16  Yes Shanker Kristeen Mans, MD  B Complex Vitamins (VITAMIN B COMPLEX PO) Take 1 tablet by mouth daily.   Yes Historical Provider, MD  Calcium Carbonate-Vitamin D (CALCIUM-VITAMIN D3 PO) Take 1 tablet by mouth daily.   Yes Historical Provider, MD  cloNIDine (CATAPRES) 0.1 MG tablet Take 0.1 mg by mouth 3 (three) times daily.    Yes Historical Provider, MD  diphenhydrAMINE (BENADRYL) 25 mg capsule Take 25 mg by mouth 2 (two) times daily as  needed for allergies.   Yes Historical Provider, MD  doxycycline (VIBRAMYCIN) 100 MG capsule Take 100 mg by mouth 2 (two) times daily. For 7 days   Yes Historical Provider, MD  estradiol (ESTRACE) 0.1 MG/GM vaginal cream Place 1 Applicatorful vaginally 2 (two) times a week. Tuesdays and Thursdays   Yes Historical Provider, MD  fluticasone (FLONASE) 50 MCG/ACT nasal spray Place 1 spray into both nostrils daily.  06/14/16  Yes Historical Provider, MD  gabapentin (NEURONTIN) 300 MG capsule Take 300 mg by mouth 2 (two) times daily.   Yes Historical Provider, MD  hydrALAZINE (APRESOLINE) 25 MG tablet Take 25 mg by mouth 3 (three) times daily.   Yes Historical Provider, MD  L-Methylfolate-Algae-B12-B6 [place]) 3-90.314-2-35 MG CAPS Take 1 capsule by mouth 2 (two) times daily.    Yes Historical Provider, MD  meloxicam (MOBIC) 15 MG tablet Take 15 mg by mouth daily.  06/18/16  Yes Historical Provider, MD  Menthol, Topical Analgesic, (ICY HOT EX) Apply 1 application topically 2  (two) times daily as needed (pain). Gel   Yes Historical Provider, MD  metoprolol (LOPRESSOR) 50 MG tablet Take 1 tablet (50 mg total) by mouth 2 (two) times daily. 07/16/16  Yes Shanker Kristeen Mans, MD  Multiple Vitamins-Minerals (DECUBI-VITE PO) Take 1 tablet by mouth daily.   Yes Historical Provider, MD  oxyCODONE (OXY IR/ROXICODONE) 5 MG immediate release tablet Take 1 tablet by mouth every 4 hours as needed for moderate pain take 2 tablets by mouth every 4 hours as needed for severe pain 07/20/16  Yes Tiffany L Reed, DO  pantoprazole (PROTONIX) 40 MG tablet Take 40 mg by mouth daily.    Yes Historical Provider, MD  PARoxetine (PAXIL) 40 MG tablet Take 40 mg by mouth daily. Reported on 01/18/2016 12/15/15  Yes Historical Provider, MD  polyethylene glycol (MIRALAX / GLYCOLAX) packet Take 17 g by mouth daily.    Yes Historical Provider, MD  sennosides-docusate sodium (SENOKOT-S) 8.6-50 MG tablet Take 2 tablets by mouth daily. Patient taking differently: Take 2 tablets by mouth 2 (two) times daily.  07/16/16  Yes Shanker Kristeen Mans, MD  silodosin (RAPAFLO) 8 MG CAPS capsule Take 8 mg by mouth daily as needed (difficulty urinating).   Yes Historical Provider, MD  UNABLE TO FIND Med Name: Med pass 90 mL 2 times daily   Yes Historical Provider, MD  vitamin E (VITAMIN E) 400 UNIT capsule Take 400 Units by mouth daily.   Yes Historical Provider, MD    Family History Family History  Problem Relation Age of Onset  . Hypertension Mother   . Cancer Mother     cervix  . Kidney disease Mother   . Hypertension Sister   . Cancer Sister     polyps  . Kidney disease Brother   . Cancer Daughter     leukemia  . Hypertension Brother     Social History Social History  Substance Use Topics  . Smoking status: Former Smoker    Quit date: 07/10/1969  . Smokeless tobacco: Never Used  . Alcohol use Yes     Comment: rare     Allergies   Latex and Penicillins   Review of Systems Review of Systems    Constitutional: Positive for fever.  Respiratory: Positive for cough and shortness of breath.   Gastrointestinal: Positive for abdominal distention and abdominal pain. Negative for diarrhea and vomiting.  All other systems reviewed and are negative.    Physical Exam Updated Vital Signs BP  127/65   Pulse 72   Temp 100.3 F (37.9 C) (Oral)   Resp 16   SpO2 94%   Physical Exam  Constitutional: She is oriented to person, place, and time. She appears well-developed and well-nourished. No distress.  HENT:  Head: Normocephalic and atraumatic.  Right Ear: Hearing normal.  Left Ear: Hearing normal.  Nose: Nose normal.  Mouth/Throat: Oropharynx is clear and moist and mucous membranes are normal.  Eyes: Conjunctivae and EOM are normal. Pupils are equal, round, and reactive to light.  Neck: Normal range of motion. Neck supple.  Cardiovascular: Regular rhythm, S1 normal and S2 normal.  Exam reveals no gallop and no friction rub.   No murmur heard. Pulmonary/Chest: No respiratory distress. She exhibits no tenderness.  Mildly diminished breathe sounds Tachypneic   Abdominal: Soft. Normal appearance and bowel sounds are normal. She exhibits distension. There is no hepatosplenomegaly. There is tenderness. There is no rebound, no guarding, no tenderness at McBurney's point and negative Murphy's sign. No hernia.  Mildly distended abdomen  Diffusely tender   Musculoskeletal: Normal range of motion.  Neurological: She is alert and oriented to person, place, and time. She has normal strength. No cranial nerve deficit or sensory deficit. Coordination normal. GCS eye subscore is 4. GCS verbal subscore is 5. GCS motor subscore is 6.  Skin: Skin is warm, dry and intact. No rash noted. No cyanosis.  Vertical midline incision with staples intact; moderate purulent drainage from lower portion of wound   Psychiatric: She has a normal mood and affect. Her speech is normal and behavior is normal. Thought  content normal.  Nursing note and vitals reviewed.    ED Treatments / Results  DIAGNOSTIC STUDIES:  Oxygen Saturation is 97% on [place], normal by my interpretation.    COORDINATION OF CARE:  11:32 PM Discussed treatment plan with pt at bedside and pt agreed to plan.  Labs (all labs ordered are listed, but only abnormal results are displayed) Labs Reviewed  COMPREHENSIVE METABOLIC PANEL - Abnormal; Notable for the following:       Result Value   Chloride 98 (*)    Glucose, Bld 116 (*)    Calcium 8.7 (*)    Total Protein 6.3 (*)    Albumin 2.5 (*)    All other components within normal limits  URINALYSIS, ROUTINE W REFLEX MICROSCOPIC (NOT AT [facility]) - Abnormal; Notable for the following:    Specific Gravity, Urine 1.003 (*)    All other components within normal limits  CBC WITH DIFFERENTIAL/PLATELET - Abnormal; Notable for the following:    RBC 3.27 (*)    Hemoglobin 9.7 (*)    HCT 29.7 (*)    Platelets 583 (*)    Monocytes Absolute 1.1 (*)    All other components within normal limits  BRAIN NATRIURETIC PEPTIDE - Abnormal; Notable for the following:    B Natriuretic Peptide 349.7 (*)    All other components within normal limits  CULTURE, BLOOD (ROUTINE X 2)  CULTURE, BLOOD (ROUTINE X 2)  URINE CULTURE  CBC WITH DIFFERENTIAL/PLATELET  I-STAT CG4 LACTIC ACID, ED    EKG  EKG Interpretation None       Radiology Dg Chest Port 1 View  Result Date: 07/21/2016 CLINICAL DATA:  Initial evaluation for acute shortness of breath. EXAM: PORTABLE CHEST 1 VIEW COMPARISON:  Prior radiograph from 07/10/2016. FINDINGS: Mild cardiomegaly is stable. Mediastinal silhouette within normal limits. Lungs are hypoinflated. Diffuse vascular congestion with indistinctness of the interstitial markings, suggesting  mild/ early pulmonary edema. Superimposed linear opacity at the left lung base most compatible with atelectasis/ scar, similar to prior. No definite focal infiltrates. No pleural effusion.  No pneumothorax. No acute osseus abnormality. IMPRESSION: 1. Stable cardiomegaly with mild diffuse pulmonary vascular congestion and indistinctness of the interstitial markings, suggesting mild and/or developing pulmonary edema. 2. Stable left basilar atelectasis and/or scar. Electronically Signed   By: Jeannine Boga M.D.   On: 07/21/2016 01:05    Procedures Procedures (including critical care time)  Medications Ordered in ED Medications  iopamidol (ISOVUE-300) 61 % injection (not administered)  vancomycin (VANCOCIN) 1,250 mg in sodium chloride 0.9 % 250 mL IVPB (not administered)  aztreonam (AZACTAM) 1 g in dextrose 5 % 50 mL IVPB (not administered)  metroNIDAZOLE (FLAGYL) IVPB 500 mg (not administered)  aztreonam (AZACTAM) 2 g in dextrose 5 % 50 mL IVPB (0 g Intravenous Stopped 07/21/16 0108)  metroNIDAZOLE (FLAGYL) IVPB 500 mg (0 mg Intravenous Stopped 07/21/16 0144)  vancomycin (VANCOCIN) IVPB 1000 mg/200 mL premix (0 mg Intravenous Stopped 07/21/16 0456)  iopamidol (ISOVUE-370) 76 % injection (100 mLs  Contrast Given 07/21/16 [id number])     Initial Impression / Assessment and Plan / ED Course  I have reviewed the triage vital signs and the nursing notes.  Pertinent labs & imaging results that were available during my care of the patient were reviewed by me and considered in my medical decision making (see chart for details).  Clinical Course   Patient presented to the ER for evaluation of drainage from surgical incision. Patient underwent laparoscopy for small bowel obstruction on October 13. Patient reports that she has had persistent pain at the incision site since surgery. Over the last several days, however, the area has started draining. Tonight there has been significant yellowish-green drainage from the lower portion of her incision. Examination reveals reveal moderate amount of purulent discharge without significant signs of cellulitis. Patient was febrile at arrival. Sepsis  protocol was initiated.  Patient was also noted to be hypoxic and tachypneic at arrival. Initial chest x-ray suggests possible volume overload. As she recently had surgery, however, will obtain CT angiography to rule out PE. Sign off case oncoming ER physician to follow-up CT angiography as well as CT of abdomen and pelvis to evaluate for deeper infection.   Final Clinical Impressions(s) / ED Diagnoses   Final diagnoses:  Wound infection after surgery, initial encounter    New Prescriptions New Prescriptions   No medications on file   I personally performed the services described in this documentation, which was scribed in my presence. The recorded information has been reviewed and is accurate.     Orpah Greek, MD 07/21/16 [phone]

## 2016-07-20 NOTE — ED Notes (Signed)
MD Pollina at bedside. 

## 2016-07-21 ENCOUNTER — Emergency Department (HOSPITAL_COMMUNITY): Payer: Medicare Other

## 2016-07-21 ENCOUNTER — Encounter (HOSPITAL_COMMUNITY): Payer: Self-pay | Admitting: *Deleted

## 2016-07-21 DIAGNOSIS — R0600 Dyspnea, unspecified: Secondary | ICD-10-CM | POA: Insufficient documentation

## 2016-07-21 DIAGNOSIS — F419 Anxiety disorder, unspecified: Secondary | ICD-10-CM | POA: Diagnosis not present

## 2016-07-21 DIAGNOSIS — I10 Essential (primary) hypertension: Secondary | ICD-10-CM | POA: Diagnosis not present

## 2016-07-21 DIAGNOSIS — D649 Anemia, unspecified: Secondary | ICD-10-CM | POA: Diagnosis present

## 2016-07-21 DIAGNOSIS — J9600 Acute respiratory failure, unspecified whether with hypoxia or hypercapnia: Secondary | ICD-10-CM | POA: Diagnosis present

## 2016-07-21 DIAGNOSIS — J9601 Acute respiratory failure with hypoxia: Secondary | ICD-10-CM | POA: Diagnosis not present

## 2016-07-21 DIAGNOSIS — T814XXA Infection following a procedure, initial encounter: Secondary | ICD-10-CM | POA: Diagnosis not present

## 2016-07-21 DIAGNOSIS — G9340 Encephalopathy, unspecified: Secondary | ICD-10-CM | POA: Diagnosis not present

## 2016-07-21 DIAGNOSIS — R0602 Shortness of breath: Secondary | ICD-10-CM | POA: Diagnosis not present

## 2016-07-21 DIAGNOSIS — K56600 Partial intestinal obstruction, unspecified as to cause: Secondary | ICD-10-CM | POA: Diagnosis not present

## 2016-07-21 DIAGNOSIS — K219 Gastro-esophageal reflux disease without esophagitis: Secondary | ICD-10-CM | POA: Diagnosis not present

## 2016-07-21 DIAGNOSIS — G8929 Other chronic pain: Secondary | ICD-10-CM | POA: Diagnosis not present

## 2016-07-21 DIAGNOSIS — G4733 Obstructive sleep apnea (adult) (pediatric): Secondary | ICD-10-CM | POA: Diagnosis not present

## 2016-07-21 DIAGNOSIS — Z90710 Acquired absence of both cervix and uterus: Secondary | ICD-10-CM | POA: Diagnosis not present

## 2016-07-21 DIAGNOSIS — R935 Abnormal findings on diagnostic imaging of other abdominal regions, including retroperitoneum: Secondary | ICD-10-CM | POA: Diagnosis not present

## 2016-07-21 DIAGNOSIS — L03311 Cellulitis of abdominal wall: Secondary | ICD-10-CM | POA: Diagnosis not present

## 2016-07-21 LAB — CBC WITH DIFFERENTIAL/PLATELET
Basophils Absolute: 0 10*3/uL (ref 0.0–0.1)
Basophils Relative: 0 %
Eosinophils Absolute: 0.1 10*3/uL (ref 0.0–0.7)
Eosinophils Relative: 1 %
HCT: 29.7 % — ABNORMAL LOW (ref 36.0–46.0)
Hemoglobin: 9.7 g/dL — ABNORMAL LOW (ref 12.0–15.0)
Lymphocytes Relative: 12 %
Lymphs Abs: 0.9 10*3/uL (ref 0.7–4.0)
MCH: 29.7 pg (ref 26.0–34.0)
MCHC: 32.7 g/dL (ref 30.0–36.0)
MCV: 90.8 fL (ref 78.0–100.0)
Monocytes Absolute: 1.1 10*3/uL — ABNORMAL HIGH (ref 0.1–1.0)
Monocytes Relative: 14 %
Neutro Abs: 5.9 10*3/uL (ref 1.7–7.7)
Neutrophils Relative %: 73 %
Platelets: 583 10*3/uL — ABNORMAL HIGH (ref 150–400)
RBC: 3.27 MIL/uL — ABNORMAL LOW (ref 3.87–5.11)
RDW: 14.7 % (ref 11.5–15.5)
WBC: 8 10*3/uL (ref 4.0–10.5)

## 2016-07-21 LAB — COMPREHENSIVE METABOLIC PANEL
ALT: 28 U/L (ref 14–54)
AST: 26 U/L (ref 15–41)
Albumin: 2.5 g/dL — ABNORMAL LOW (ref 3.5–5.0)
Alkaline Phosphatase: 103 U/L (ref 38–126)
Anion gap: 9 (ref 5–15)
BUN: 7 mg/dL (ref 6–20)
CO2: 28 mmol/L (ref 22–32)
Calcium: 8.7 mg/dL — ABNORMAL LOW (ref 8.9–10.3)
Chloride: 98 mmol/L — ABNORMAL LOW (ref 101–111)
Creatinine, Ser: 0.76 mg/dL (ref 0.44–1.00)
GFR calc Af Amer: >60 mL/min (ref >60)
GFR calc non Af Amer: >60 mL/min (ref >60)
Glucose, Bld: 116 mg/dL — ABNORMAL HIGH (ref 65–99)
Potassium: 4.3 mmol/L (ref 3.5–5.1)
Sodium: 135 mmol/L (ref 135–145)
Total Bilirubin: 0.8 mg/dL (ref 0.3–1.2)
Total Protein: 6.3 g/dL — ABNORMAL LOW (ref 6.5–8.1)

## 2016-07-21 LAB — I-STAT ARTERIAL BLOOD GAS, ED
Acid-Base Excess: 7 mmol/L — ABNORMAL HIGH (ref 0.0–2.0)
Bicarbonate: 32 mmol/L — ABNORMAL HIGH (ref 20.0–28.0)
O2 Saturation: 99 %
Patient temperature: 98.6
TCO2: 33 mmol/L (ref 0–100)
pCO2 arterial: 45.2 mmHg (ref 32.0–48.0)
pH, Arterial: 7.458 — ABNORMAL HIGH (ref 7.350–7.450)
pO2, Arterial: 117 mmHg — ABNORMAL HIGH (ref 83.0–108.0)

## 2016-07-21 LAB — URINALYSIS, ROUTINE W REFLEX MICROSCOPIC
Bilirubin Urine: NEGATIVE
Glucose, UA: NEGATIVE mg/dL
Hgb urine dipstick: NEGATIVE
Ketones, ur: NEGATIVE mg/dL
Leukocytes, UA: NEGATIVE
Nitrite: NEGATIVE
Protein, ur: NEGATIVE mg/dL
Specific Gravity, Urine: 1.003 — ABNORMAL LOW (ref 1.005–1.030)
pH: 6 (ref 5.0–8.0)

## 2016-07-21 LAB — BRAIN NATRIURETIC PEPTIDE: B Natriuretic Peptide: 349.7 pg/mL — ABNORMAL HIGH (ref 0.0–100.0)

## 2016-07-21 LAB — I-STAT CG4 LACTIC ACID, ED: Lactic Acid, Venous: 1.09 mmol/L (ref 0.5–1.9)

## 2016-07-21 MED ORDER — OXYCODONE-ACETAMINOPHEN 5-325 MG PO TABS: 1.0000 | ORAL_TABLET | ORAL | Status: DC | PRN

## 2016-07-21 MED ORDER — METRONIDAZOLE IN NACL 5-0.79 MG/ML-% IV SOLN
500.0000 mg | Freq: Three times a day (TID) | INTRAVENOUS | Status: DC
  Administered 2016-07-21 – 2016-07-22 (×4): 500 mg via INTRAVENOUS
  Filled 2016-07-21 (×4): qty 100

## 2016-07-21 MED ORDER — POLYETHYLENE GLYCOL 3350 17 G PO PACK
17.0000 g | PACK | Freq: Every day | ORAL | Status: DC
Start: 2016-07-21 — End: 2016-07-21

## 2016-07-21 MED ORDER — SODIUM CHLORIDE 0.9% FLUSH
3.0000 mL | Freq: Two times a day (BID) | INTRAVENOUS | Status: DC
  Administered 2016-07-22 – 2016-07-31 (×8): 3 mL via INTRAVENOUS

## 2016-07-21 MED ORDER — SENNOSIDES-DOCUSATE SODIUM 8.6-50 MG PO TABS
2.0000 | ORAL_TABLET | Freq: Two times a day (BID) | ORAL | Status: DC
  Administered 2016-07-21 – 2016-08-01 (×20): 2 via ORAL
  Filled 2016-07-21 (×22): qty 2

## 2016-07-21 MED ORDER — DEXTROSE 5 % IV SOLN
1.0000 g | Freq: Three times a day (TID) | INTRAVENOUS | Status: DC
  Filled 2016-07-21 (×2): qty 1

## 2016-07-21 MED ORDER — TAMSULOSIN HCL 0.4 MG PO CAPS
0.4000 mg | ORAL_CAPSULE | Freq: Every day | ORAL | Status: DC
  Administered 2016-07-21 – 2016-07-29 (×9): 0.4 mg via ORAL
  Filled 2016-07-21 (×9): qty 1

## 2016-07-21 MED ORDER — CALCIUM CARBONATE-VITAMIN D 500-200 MG-UNIT PO TABS
1.0000 | ORAL_TABLET | Freq: Every day | ORAL | Status: DC
  Administered 2016-07-22 – 2016-08-01 (×9): 1 via ORAL
  Filled 2016-07-21 (×10): qty 1

## 2016-07-21 MED ORDER — ONDANSETRON HCL 4 MG/2ML IJ SOLN
4.0000 mg | Freq: Four times a day (QID) | INTRAMUSCULAR | Status: DC | PRN
  Administered 2016-07-22: 4 mg via INTRAVENOUS
  Filled 2016-07-21: qty 2

## 2016-07-21 MED ORDER — MUPIROCIN 2 % EX OINT: 1.0000 "application " | TOPICAL_OINTMENT | Freq: Two times a day (BID) | CUTANEOUS | Status: DC

## 2016-07-21 MED ORDER — IOPAMIDOL (ISOVUE-300) INJECTION 61%
INTRAVENOUS | Status: AC
  Filled 2016-07-21: qty 30

## 2016-07-21 MED ORDER — VANCOMYCIN HCL 10 G IV SOLR
1250.0000 mg | Freq: Two times a day (BID) | INTRAVENOUS | Status: DC
  Filled 2016-07-21 (×2): qty 1250

## 2016-07-21 MED ORDER — CHLORHEXIDINE GLUCONATE CLOTH 2 % EX PADS: 6.0000 | MEDICATED_PAD | Freq: Every day | CUTANEOUS | Status: DC

## 2016-07-21 MED ORDER — IOPAMIDOL (ISOVUE-370) INJECTION 76%
INTRAVENOUS | Status: AC
  Administered 2016-07-21: 100 mL
  Filled 2016-07-21: qty 100

## 2016-07-21 MED ORDER — CALCIUM-VITAMIN D3 500-400 MG-UNIT PO TABS: ORAL_TABLET | Freq: Every day | ORAL | Status: DC

## 2016-07-21 MED ORDER — CLONIDINE HCL 0.1 MG PO TABS
0.1000 mg | ORAL_TABLET | Freq: Three times a day (TID) | ORAL | Status: DC
  Administered 2016-07-21 – 2016-08-01 (×34): 0.1 mg via ORAL
  Filled 2016-07-21 (×35): qty 1

## 2016-07-21 MED ORDER — TRAMADOL HCL 50 MG PO TABS
50.0000 mg | ORAL_TABLET | Freq: Four times a day (QID) | ORAL | Status: DC | PRN
  Administered 2016-07-22 – 2016-07-30 (×7): 50 mg via ORAL
  Filled 2016-07-21 (×9): qty 1

## 2016-07-21 MED ORDER — ACETAMINOPHEN 500 MG PO TABS
1000.0000 mg | ORAL_TABLET | Freq: Three times a day (TID) | ORAL | Status: DC | PRN
  Administered 2016-07-22 – 2016-07-26 (×2): 1000 mg via ORAL
  Filled 2016-07-21 (×2): qty 2

## 2016-07-21 MED ORDER — PAROXETINE HCL 20 MG PO TABS
40.0000 mg | ORAL_TABLET | Freq: Every day | ORAL | Status: DC
  Administered 2016-07-21 – 2016-08-01 (×11): 40 mg via ORAL
  Filled 2016-07-21 (×14): qty 2

## 2016-07-21 MED ORDER — NALOXONE HCL 0.4 MG/ML IJ SOLN: 0.4000 mg | INTRAMUSCULAR | Status: DC | PRN

## 2016-07-21 MED ORDER — SODIUM CHLORIDE 0.9 % IV SOLN
1250.0000 mg | Freq: Two times a day (BID) | INTRAVENOUS | Status: DC
  Administered 2016-07-21 – 2016-07-24 (×6): 1250 mg via INTRAVENOUS
  Filled 2016-07-21 (×8): qty 1250

## 2016-07-21 MED ORDER — SENNA-DOCUSATE SODIUM 8.6-50 MG PO TABS: 2.0000 | ORAL_TABLET | Freq: Two times a day (BID) | ORAL | Status: DC

## 2016-07-21 MED ORDER — DEXTROSE 5 % IV SOLN
1.0000 g | Freq: Three times a day (TID) | INTRAVENOUS | Status: DC
  Administered 2016-07-21 – 2016-07-22 (×3): 1 g via INTRAVENOUS
  Filled 2016-07-21 (×6): qty 1

## 2016-07-21 MED ORDER — METOPROLOL TARTRATE 50 MG PO TABS
50.0000 mg | ORAL_TABLET | Freq: Two times a day (BID) | ORAL | Status: DC
  Administered 2016-07-21 – 2016-08-01 (×22): 50 mg via ORAL
  Filled 2016-07-21 (×23): qty 1

## 2016-07-21 MED ORDER — PAROXETINE HCL 20 MG PO TABS: 40.0000 mg | ORAL_TABLET | Freq: Every day | ORAL | Status: DC

## 2016-07-21 MED ORDER — FLUTICASONE PROPIONATE 50 MCG/ACT NA SUSP
1.0000 | Freq: Every day | NASAL | Status: DC
  Administered 2016-07-21 – 2016-08-01 (×12): 1 via NASAL
  Filled 2016-07-21: qty 16

## 2016-07-21 MED ORDER — L-METHYLFOLATE-B6-B12 3-35-2 MG PO TABS
1.0000 | ORAL_TABLET | Freq: Two times a day (BID) | ORAL | Status: DC
  Administered 2016-07-21 – 2016-08-01 (×20): 1 via ORAL
  Filled 2016-07-21 (×24): qty 1

## 2016-07-21 MED ORDER — ESTRADIOL 0.1 MG/GM VA CREA
1.0000 | TOPICAL_CREAM | VAGINAL | Status: DC
  Administered 2016-07-23 – 2016-07-30 (×3): 1 via VAGINAL
  Filled 2016-07-21: qty 42.5

## 2016-07-21 MED ORDER — MORPHINE SULFATE (PF) 2 MG/ML IV SOLN
1.0000 mg | INTRAVENOUS | Status: DC | PRN
  Administered 2016-07-21 – 2016-07-23 (×5): 4 mg via INTRAVENOUS
  Administered 2016-07-23 – 2016-07-29 (×9): 2 mg via INTRAVENOUS
  Administered 2016-07-29: 4 mg via INTRAVENOUS
  Administered 2016-07-29 – 2016-07-31 (×3): 2 mg via INTRAVENOUS
  Filled 2016-07-21: qty 2
  Filled 2016-07-21: qty 1
  Filled 2016-07-21: qty 2
  Filled 2016-07-21 (×7): qty 1
  Filled 2016-07-21: qty 2
  Filled 2016-07-21: qty 1
  Filled 2016-07-21: qty 2
  Filled 2016-07-21: qty 1
  Filled 2016-07-21: qty 2
  Filled 2016-07-21 (×3): qty 1
  Filled 2016-07-21: qty 2

## 2016-07-21 MED ORDER — SENNA 8.6 MG PO TABS
2.0000 | ORAL_TABLET | Freq: Two times a day (BID) | ORAL | Status: DC
  Administered 2016-07-21 – 2016-07-23 (×4): 17.2 mg via ORAL
  Filled 2016-07-21 (×5): qty 2

## 2016-07-21 MED ORDER — METANX 3-90.314-2-35 MG PO CAPS: 1.0000 | ORAL_CAPSULE | Freq: Two times a day (BID) | ORAL | Status: DC

## 2016-07-21 MED ORDER — POLYETHYLENE GLYCOL 3350 17 G PO PACK
17.0000 g | PACK | Freq: Every day | ORAL | Status: DC
  Administered 2016-07-21 – 2016-08-01 (×11): 17 g via ORAL
  Filled 2016-07-21 (×12): qty 1

## 2016-07-21 MED ORDER — ONDANSETRON HCL 4 MG PO TABS: 4.0000 mg | ORAL_TABLET | Freq: Four times a day (QID) | ORAL | Status: DC | PRN

## 2016-07-21 MED ORDER — ALBUTEROL SULFATE (2.5 MG/3ML) 0.083% IN NEBU
2.5000 mg | INHALATION_SOLUTION | Freq: Four times a day (QID) | RESPIRATORY_TRACT | Status: AC
  Administered 2016-07-21 (×2): 2.5 mg via RESPIRATORY_TRACT
  Filled 2016-07-21 (×2): qty 3

## 2016-07-21 MED ORDER — MORPHINE SULFATE (PF) 2 MG/ML IV SOLN
2.0000 mg | Freq: Once | INTRAVENOUS | Status: AC
  Administered 2016-07-21: 2 mg via INTRAVENOUS
  Filled 2016-07-21: qty 1

## 2016-07-21 NOTE — ED Provider Notes (Signed)
Blood pressure 130/66, pulse 74, temperature 100.3 F (37.9 C), temperature source Oral, resp. rate 19, SpO2 100 %.  Assuming care from Dr. Betsey Holiday.  In short, Amy [name] is a 70 y.o. female with a chief complaint of Abdominal Pain (recent surgery) .  Refer to the original H&P for additional details.  The current plan of care is to follow CTA chest and CT abdomen/pelvis are reassess.  08:25 AM Labs and imaging reviewed. I performed independent assessment of the patient. She has some purulent drainage from her incision. Aztreonam and Flagyl started previously. Normal BP and lactate so no fluids given. Patient does appear to be volume up with O2 requirement and dyspnea earlier in the ED course. CT scan shows no drainable abscess but does show some cellulitis in the region of the incision. There is also concern for a partial small bowel obstruction. No vomiting here. I have paged general surgery to discuss the case.   08:58 AM Spoke with general surgery team. They will be down to evaluate the patient's wound and drainage. They recommend admission to medicine team for dyspnea and hypoxemia. Have paged hospitalist for admission.   09:11 AM Discussed patient's case with hospitalist, Dr. Aggie Moats.  Recommend admission to telemetry, observation bed.  I will place holding orders per their request. Patient and family (if present) updated with plan. Care transferred to hospitalist service.  I reviewed all nursing notes, vitals, pertinent old records, EKGs, labs, imaging (as available).  Nanda Quinton, MD     Margette Fast, MD 07/21/16 [phone]

## 2016-07-21 NOTE — ED Notes (Signed)
Nurse drawing labs. 

## 2016-07-21 NOTE — ED Notes (Signed)
Attempted to call report

## 2016-07-21 NOTE — ED Notes (Signed)
Unable to get correct dose of ABX from our stock. Pharmacy contacted to send to floor.

## 2016-07-21 NOTE — Consult Note (Signed)
[facility] Surgery Consult Note  Amy [name] 11/11/1945  [id number].    Requesting MD: Dr. Laverta Baltimore Chief Complaint/Reason for Consult: post-operative cellulitis  HPI:  70 year-old female PMH SBO, HTN, chronic pain (5 back surgeries), obesity, and anxiety. Last admitted to the hospital 07/03/16 for SBO and subsequent diagnostic laparoscopy emergent coversion to open with LOA x 3 hours on 10/13. She was discharged to [facility] place on 10/23 with plan to have staples removed on POD#14 (10/27). She presents today from [facility] place with abdominal pain and purulent drainage from her abdominal incision for 4 days. Pain described as intermittent, generalized, and sharp. Also endorses backpain, present prior to surgery. Associated symptoms include low grade fever, fatigue, cough, and SOB starting last night. Her last BM was Wednesday and was her second BM since surgery. She is passing gas. She denies HA, palpitations, CP, productive cough, nausea, vomiting, and urinary symptoms.   ED work-up: CTA to r/o PE - negative CT abd pelvis - Midline anterior abdominal wound with air within the wound trace and surrounding mild inflammatory changes concerning for cellulitis. No drainable fluid collection. Multiple loops small bowel matted along anterior abdominal wall concerning for enteritis. Mild distention (3.6 cm) small bowel loops. CXR - pulmonary vascular congestion suggesting mild/developing pulmonary edema  WBC 8.0 Lactic acid 1.09 hgb/hct 9.7//29.7 BNP 349  ROS: All systems reviewed and otherwise negative except for as above  Family History  Problem Relation Age of Onset  . Hypertension Mother   . Cancer Mother     cervix  . Kidney disease Mother   . Hypertension Sister   . Cancer Sister     polyps  . Kidney disease Brother   . Cancer Daughter     leukemia  . Hypertension Brother     Past Medical History:  Diagnosis Date  . Abdominal pain of unknown etiology 02/05/2016  .  Anxiety   . Arthritis    ddd- RA  . Asthma    "sleeping asthma"  . Chronic back pain    lumbar steroid injection's  . Constipation   . Depression   . Dysphonia    intermittent "voice changes"  . Fibromyalgia   . GERD (gastroesophageal reflux disease)   . Glaucoma   . Headache   . History of DVT (deep vein thrombosis) 1989   LEFT UPPER ARM  . History of hiatal hernia   . History of MRSA infection 2011  . Hypertension   . Irregular heart rate    "years ago"  . Low iron   . Mild obstructive sleep apnea    per study 02-07-2006 - no cpap  . Neuropathy (HCC)    feet  . Pelvic pain in female   . Peripheral vascular disease ([place])    "poor circulation"  . SBO (small bowel obstruction) 06/2016  . Seasonal allergies   . Short of breath on exertion   . Urinary frequency   . Weakness    both hands and feet    Past Surgical History:  Procedure Laterality Date  . abdominal adhesions removed    . ABDOMINAL HYSTERECTOMY  1978  . BUNIONECTOMY Left 2008  . COLONOSCOPY    . CYSTO WITH HYDRODISTENSION N/A 07/12/2015   Procedure: CYSTOSCOPY/HYDRODISTENSION;  Surgeon: Bjorn Loser, MD;  Location: [facility];  Service: Urology;  Laterality: N/A;  . ESOPHAGEAL MANOMETRY N/A 11/22/2014   Procedure: ESOPHAGEAL MANOMETRY (EM);  Surgeon: Winfield Cunas., MD;  Location: [facility];  Service: Endoscopy;  Laterality: N/A;  . ESOPHAGOGASTRODUODENOSCOPY N/A 07/15/2013   Procedure: ESOPHAGOGASTRODUODENOSCOPY (EGD);  Surgeon: Winfield Cunas., MD;  Location: [facility];  Service: Endoscopy;  Laterality: N/A;  need xray  . EYE SURGERY Bilateral    cataract surgery with lens implants  . HARDWARE REMOVAL N/A 01/02/2016   Procedure: Exploration of Lumbar Fusion,Removal of hardware Lumbar One-Two ;Redo Posterior Lumbar Fusion Lumbar One-Two;  Surgeon: Kary Kos, MD;  Location: [facility];  Service: Neurosurgery;  Laterality: N/A;  . LAPAROSCOPIC CHOLECYSTECTOMY  01-11-2006   . LAPAROSCOPY N/A 07/06/2016   Procedure: LAPAROSCOPY DIAGNOSTIC EMERGENT TO OPEN;  Surgeon: Greer Pickerel, MD;  Location: [place];  Service: General;  Laterality: N/A;  . LAPAROTOMY N/A 07/06/2016   Procedure: EXPLORATORY LAPAROTOMY LYSIS OF ADHESIONS FOR 3 HOURS;  Surgeon: Greer Pickerel, MD;  Location: [place];  Service: General;  Laterality: N/A;  . LUMBAR FUSION  2014   L4 -- L5  . MASS EXCISION Left 02/20/2013   Procedure: EXCISION LEFT BUTTOCK  MASS;  Surgeon: Adin Hector, MD;  Location: [facility];  Service: General;  Laterality: Left;  . NOSE SURGERY  2007  . REMOVAL HARDWARE L4-L5/  BILATERAL LAMINECTOMY L2 - L5 AND FUSION  06-12-2011  . SHOULDER OPEN ROTATOR CUFF REPAIR Left 05/05/2014   Procedure: OPEN ACROMIONECTOMY AND OPEN REPAIR OF ROTATOR CUFF, TISSUEMEND GRAFT WITH ANCHOR ;  Surgeon: Tobi Bastos, MD;  Location: [facility];  Service: Orthopedics;  Laterality: Left;  . SPINE SURGERY    . TONSILLECTOMY      Social History:  reports that she quit smoking about 47 years ago. She has never used smokeless tobacco. She reports that she drinks alcohol. She reports that she does not use drugs.  Allergies:  Allergies  Allergen Reactions  . Latex Other (See Comments)    Sores, blisters - reaction to gloves  . Penicillins Other (See Comments)    Pt was told she was allergic to penicillin in her mid 20'2 - does not recall the reaction. She has taken amoxicillin and ampicillin with no reaction.      (Not in a hospital admission)  Blood pressure 127/63, pulse 69, temperature 100.3 F (37.9 C), temperature source Oral, resp. rate 14, SpO2 100 %. Physical Exam: General: pleasant, obese AA female who is laying in bed som\nolent but arousable to voice and orient to person, place, time (name, [place], , 2017) HEENT: head is normocephalic, atraumatic. Heart: regular, rate, and rhythm.  No obvious murmurs, gallops, or rubs noted.  Palpable pedal pulses bilaterally Lungs: on 2L  [place]. CTAB, Respiratory effort nonlabored, decreased breath sounds at bases, no crackles appreciated Abd: soft, mild tenderness around wound, ND, +BS; all staples removed at bedside, purulent drainage appreciated from central and inferior aspect of laparotomy incision. Mild surrounding skin changes. Wound cultures taken.    MS: all 4 extremities are symmetrical with no cyanosis, clubbing, or edema. Skin: warm and dry  Psych:  appropriate affect. Neuro: A&Ox3, normal speech  Results for orders placed or performed during the hospital encounter of 07/20/16 (from the past 48 hour(s))  Comprehensive metabolic panel     Status: Abnormal   Collection Time: 07/21/16 12:11 AM  Result Value Ref Range   Sodium 135 135 - 145 mmol/L   Potassium 4.3 3.5 - 5.1 mmol/L   Chloride 98 (L) 101 - 111 mmol/L   CO2 28 22 - 32 mmol/L   Glucose, Bld 116 (H) 65 - 99 mg/dL   BUN  7 6 - 20 mg/dL   Creatinine, Ser 0.76 0.44 - 1.00 mg/dL   Calcium 8.7 (L) 8.9 - 10.3 mg/dL   Total Protein 6.3 (L) 6.5 - 8.1 g/dL   Albumin 2.5 (L) 3.5 - 5.0 g/dL   AST 26 15 - 41 U/L   ALT 28 14 - 54 U/L   Alkaline Phosphatase 103 38 - 126 U/L   Total Bilirubin 0.8 0.3 - 1.2 mg/dL   GFR calc non Af Amer >60 >60 mL/min   GFR calc Af Amer >60 >60 mL/min    Comment: (NOTE) The eGFR has been calculated using the CKD EPI equation. This calculation has not been validated in all clinical situations. eGFR's persistently <60 mL/min signify possible Chronic Kidney Disease.    Anion gap 9 5 - 15  I-Stat CG4 Lactic Acid, ED  (not at  [facility])     Status: None   Collection Time: 07/21/16 12:35 AM  Result Value Ref Range   Lactic Acid, Venous 1.09 0.5 - 1.9 mmol/L  Urinalysis, Routine w reflex microscopic (not at [facility])     Status: Abnormal   Collection Time: 07/21/16 12:38 AM  Result Value Ref Range   Color, Urine YELLOW YELLOW   APPearance CLEAR CLEAR   Specific Gravity, Urine 1.003 (L) 1.005 - 1.030   pH 6.0 5.0 - 8.0   Glucose, UA  NEGATIVE NEGATIVE mg/dL   Hgb urine dipstick NEGATIVE NEGATIVE   Bilirubin Urine NEGATIVE NEGATIVE   Ketones, ur NEGATIVE NEGATIVE mg/dL   Protein, ur NEGATIVE NEGATIVE mg/dL   Nitrite NEGATIVE NEGATIVE   Leukocytes, UA NEGATIVE NEGATIVE    Comment: MICROSCOPIC NOT DONE ON URINES WITH NEGATIVE PROTEIN, BLOOD, LEUKOCYTES, NITRITE, OR GLUCOSE <1000 mg/dL.  CBC with Differential/Platelet     Status: Abnormal   Collection Time: 07/21/16  1:23 AM  Result Value Ref Range   WBC 8.0 4.0 - 10.5 K/uL   RBC 3.27 (L) 3.87 - 5.11 MIL/uL   Hemoglobin 9.7 (L) 12.0 - 15.0 g/dL   HCT 29.7 (L) 36.0 - 46.0 %   MCV 90.8 78.0 - 100.0 fL   MCH 29.7 26.0 - 34.0 pg   MCHC 32.7 30.0 - 36.0 g/dL   RDW 14.7 11.5 - 15.5 %   Platelets 583 (H) 150 - 400 K/uL   Neutrophils Relative % 73 %   Neutro Abs 5.9 1.7 - 7.7 K/uL   Lymphocytes Relative 12 %   Lymphs Abs 0.9 0.7 - 4.0 K/uL   Monocytes Relative 14 %   Monocytes Absolute 1.1 (H) 0.1 - 1.0 K/uL   Eosinophils Relative 1 %   Eosinophils Absolute 0.1 0.0 - 0.7 K/uL   Basophils Relative 0 %   Basophils Absolute 0.0 0.0 - 0.1 K/uL  Brain natriuretic peptide     Status: Abnormal   Collection Time: 07/21/16  1:42 AM  Result Value Ref Range   B Natriuretic Peptide 349.7 (H) 0.0 - 100.0 pg/mL   Ct Angio Chest Pe W Or Wo Contrast  Result Date: 07/21/2016 CLINICAL DATA:  Shortness of breath. Intra abdominal infection. Cellulitis. EXAM: CT ANGIOGRAPHY CHEST CT ABDOMEN AND PELVIS WITH CONTRAST TECHNIQUE: Multidetector CT imaging of the chest was performed using the standard protocol during bolus administration of intravenous contrast. Multiplanar CT image reconstructions and MIPs were obtained to evaluate the vascular anatomy. Multidetector CT imaging of the abdomen and pelvis was performed using the standard protocol during bolus administration of intravenous contrast. CONTRAST:  100 mL Isovue 370 COMPARISON:  None. FINDINGS: CTA CHEST FINDINGS Cardiovascular:  Satisfactory opacification of the pulmonary arteries to the segmental level. No evidence of pulmonary embolism. Normal heart size. No pericardial effusion. Normal caliber thoracic aorta with mild atherosclerosis. No thoracic aortic dissection. Mediastinum/Nodes: No enlarged mediastinal, hilar, or axillary lymph nodes. Thyroid gland, trachea, and esophagus demonstrate no significant findings. Lungs/Pleura: Trace bilateral pleural effusions. Mild bibasilar atelectasis. No focal consolidation. No pneumothorax. Musculoskeletal: No acute osseous abnormality. Mild mid thoracic degenerative disc disease. No aggressive lytic or sclerotic osseous lesion. Review of the MIP images confirms the above findings. CT ABDOMEN and PELVIS FINDINGS Hepatobiliary: No focal liver abnormality is seen. Status post cholecystectomy. No biliary dilatation. Pancreas: Unremarkable. No pancreatic ductal dilatation or surrounding inflammatory changes. Spleen: Normal in size without focal abnormality. Adrenals/Urinary Tract: Adrenal glands are unremarkable. Kidneys are normal, without renal calculi, focal lesion, or hydronephrosis. Bladder is unremarkable. Stomach/Bowel: Midline anterior abdominal wound with air within the wound track and surrounding mild inflammatory changes concerning for cellulitis. No drainable fluid collection. Multiple loops of small bowel are matted along the anterior abdominal wall just beneath the incision site with wall thickening most concerning for enteritis. Mild distention of small bowel loops measuring approximately 3.6 cm in diameter concerning for partial small bowel obstruction. No pneumatosis, pneumoperitoneum or portal venous gas. Vascular/Lymphatic: Normal caliber abdominal aorta with atherosclerosis. No lymphadenopathy. Reproductive: Status post hysterectomy. No adnexal masses. Other: No fluid collection or hematoma. Musculoskeletal: Anterior lumbar interbody fusion at L4-5 and L5-S1 with solid osseous  bridging. Posterior lumbar interbody fusion at L3-4 with solid osseous bridging across the L3-4 disc space and posterior elements. Posterior lumbar fusion at L2-3 with solid osseous fusion across the posterior elements. Posterior lumbar fusion at L1-2 with interbody prosthesis and laminectomy. No acute osseous abnormality. No aggressive lytic or sclerotic osseous lesion. Review of the MIP images confirms the above findings. IMPRESSION: 1. No of evidence of pulmonary embolus. 2. Trace bilateral pleural effusions and atelectasis. 3. Midline anterior abdominal wound with air within the wound track and surrounding mild inflammatory changes concerning for cellulitis. No drainable fluid collection. 4. Multiple loops of small bowel are matted along the anterior abdominal wall just beneath the incision site with wall thickening most concerning for enteritis. Mild distention of small bowel loops measuring approximately 3.6 cm in diameter concerning for partial small bowel obstruction. Electronically Signed   By: Kathreen Devoid   On: 07/21/2016 08:15   Ct Abdomen Pelvis W Contrast  Result Date: 07/21/2016 CLINICAL DATA:  Shortness of breath. Intra abdominal infection. Cellulitis. EXAM: CT ANGIOGRAPHY CHEST CT ABDOMEN AND PELVIS WITH CONTRAST TECHNIQUE: Multidetector CT imaging of the chest was performed using the standard protocol during bolus administration of intravenous contrast. Multiplanar CT image reconstructions and MIPs were obtained to evaluate the vascular anatomy. Multidetector CT imaging of the abdomen and pelvis was performed using the standard protocol during bolus administration of intravenous contrast. CONTRAST:  100 mL Isovue 370 COMPARISON:  None. FINDINGS: CTA CHEST FINDINGS Cardiovascular: Satisfactory opacification of the pulmonary arteries to the segmental level. No evidence of pulmonary embolism. Normal heart size. No pericardial effusion. Normal caliber thoracic aorta with mild atherosclerosis. No  thoracic aortic dissection. Mediastinum/Nodes: No enlarged mediastinal, hilar, or axillary lymph nodes. Thyroid gland, trachea, and esophagus demonstrate no significant findings. Lungs/Pleura: Trace bilateral pleural effusions. Mild bibasilar atelectasis. No focal consolidation. No pneumothorax. Musculoskeletal: No acute osseous abnormality. Mild mid thoracic degenerative disc disease. No aggressive lytic or sclerotic osseous lesion. Review of the MIP images confirms the  above findings. CT ABDOMEN and PELVIS FINDINGS Hepatobiliary: No focal liver abnormality is seen. Status post cholecystectomy. No biliary dilatation. Pancreas: Unremarkable. No pancreatic ductal dilatation or surrounding inflammatory changes. Spleen: Normal in size without focal abnormality. Adrenals/Urinary Tract: Adrenal glands are unremarkable. Kidneys are normal, without renal calculi, focal lesion, or hydronephrosis. Bladder is unremarkable. Stomach/Bowel: Midline anterior abdominal wound with air within the wound track and surrounding mild inflammatory changes concerning for cellulitis. No drainable fluid collection. Multiple loops of small bowel are matted along the anterior abdominal wall just beneath the incision site with wall thickening most concerning for enteritis. Mild distention of small bowel loops measuring approximately 3.6 cm in diameter concerning for partial small bowel obstruction. No pneumatosis, pneumoperitoneum or portal venous gas. Vascular/Lymphatic: Normal caliber abdominal aorta with atherosclerosis. No lymphadenopathy. Reproductive: Status post hysterectomy. No adnexal masses. Other: No fluid collection or hematoma. Musculoskeletal: Anterior lumbar interbody fusion at L4-5 and L5-S1 with solid osseous bridging. Posterior lumbar interbody fusion at L3-4 with solid osseous bridging across the L3-4 disc space and posterior elements. Posterior lumbar fusion at L2-3 with solid osseous fusion across the posterior elements.  Posterior lumbar fusion at L1-2 with interbody prosthesis and laminectomy. No acute osseous abnormality. No aggressive lytic or sclerotic osseous lesion. Review of the MIP images confirms the above findings. IMPRESSION: 1. No of evidence of pulmonary embolus. 2. Trace bilateral pleural effusions and atelectasis. 3. Midline anterior abdominal wound with air within the wound track and surrounding mild inflammatory changes concerning for cellulitis. No drainable fluid collection. 4. Multiple loops of small bowel are matted along the anterior abdominal wall just beneath the incision site with wall thickening most concerning for enteritis. Mild distention of small bowel loops measuring approximately 3.6 cm in diameter concerning for partial small bowel obstruction. Electronically Signed   By: Kathreen Devoid   On: 07/21/2016 08:15   Dg Chest Port 1 View  Result Date: 07/21/2016 CLINICAL DATA:  Initial evaluation for acute shortness of breath. EXAM: PORTABLE CHEST 1 VIEW COMPARISON:  Prior radiograph from 07/10/2016. FINDINGS: Mild cardiomegaly is stable. Mediastinal silhouette within normal limits. Lungs are hypoinflated. Diffuse vascular congestion with indistinctness of the interstitial markings, suggesting mild/ early pulmonary edema. Superimposed linear opacity at the left lung base most compatible with atelectasis/ scar, similar to prior. No definite focal infiltrates. No pleural effusion. No pneumothorax. No acute osseus abnormality. IMPRESSION: 1. Stable cardiomegaly with mild diffuse pulmonary vascular congestion and indistinctness of the interstitial markings, suggesting mild and/or developing pulmonary edema. 2. Stable left basilar atelectasis and/or scar. Electronically Signed   By: Jeannine Boga M.D.   On: 07/21/2016 01:05   Assessment/Plan Abdominal cellulitis and wound infection S/p diagnostic laparoscopy emergent coversion to open with LOA x 3 hours on 10/13, Dr. Greer Pickerel - CT scan shows  no deep fluid collection - wound cultures pending - aztreonam and flagyl per pharmacy  CT evidence of early, partial small bowel obstruction - having flatus - bowel regimen  Acute respiratory failure - mild pulmonary effusion on CT - hypoxemia, tachypnea at presentation to ED - mild elevation in BNP  Acute encephalopathy - Not appreciated during my exam, A&Ox3 - is somnolent/lethargic Anemia HTN Chronic pain GERD  FEN: heart healthy, IVF ID: aztreonam, flagyl VTE: SCD's  Plan: admit per medicine for ARF IV abx Suspect would will need to be partially opened up for adequate drainage, will discuss with MD.   Jill Alexanders, [facility] Surgery 07/21/2016, 9:08 AM Pager: [phone] Consults: [phone]  Mon-Fri 7:00 am-4:30 pm Sat-Sun 7:00 am-11:30 am

## 2016-07-21 NOTE — H&P (Signed)
History and Physical    Amy [name] [id number] DOB: Apr 06, 1946 DOA: 07/20/2016  PCP: Blanchie Serve, MD Patient coming from: rehab center  Chief Complaint: abdominal pain/draining surgical wound  HPI: Amy [name] is a 70 y.o. female with medical history significant for recent sbo s/p surgical repair 2 weeks ago, htn, chronic pain (knee, back, shoulder,neck) obesity, anxiety [facility] emergency department chief complaint of increased drainage from incision for 4 days and generalized weakness. Initial evaluation reveals foul-smelling draining wound tachypnea and hypoxia  Information is obtained from the husband is at the bedside. Patient had abdominal surgery and was discharged 5 days ago to a rehabilitation center. She was doing "okay" for about 1 and day and then she gradually began to feel worse and worse. He she complained of abdominal pain and generalized weakness and not wanting to escape in physical therapy. Patient states "I just didn't feel good". Associated symptoms include low-grade fever generalized weakness and shortness of breath with activities. She denies headache dizziness syncope or near-syncope. She denies nausea vomiting diarrhea constipation. She denies dysuria hematuria frequency or urgency.  ED Course: In the emergency department max temp 100.3 she's tachypnea at 38 a minute and hypoxic oxygen saturation level 89% on room air. She is provided with oxygen and maintained oxygen saturation level greater than 95% during her stay in the emergency department. She is hemodynamically stable. He is provided with antibiotics  Review of Systems: As per HPI otherwise 10 point review of systems negative.   Ambulatory Status: Ambulating with her walker at rehabilitation no recent falls  Past Medical History:  Diagnosis Date  . Abdominal pain of unknown etiology 02/05/2016  . Anxiety   . Arthritis    ddd- RA  . Asthma    "sleeping asthma"  . Chronic back  pain    lumbar steroid injection's  . Constipation   . Depression   . Dysphonia    intermittent "voice changes"  . Fibromyalgia   . GERD (gastroesophageal reflux disease)   . Glaucoma   . Headache   . History of DVT (deep vein thrombosis) 1989   LEFT UPPER ARM  . History of hiatal hernia   . History of MRSA infection 2011  . Hypertension   . Irregular heart rate    "years ago"  . Low iron   . Mild obstructive sleep apnea    per study 02-07-2006 - no cpap  . Neuropathy (HCC)    feet  . Pelvic pain in female   . Peripheral vascular disease ([place])    "poor circulation"  . SBO (small bowel obstruction) 06/2016  . Seasonal allergies   . Short of breath on exertion   . Urinary frequency   . Weakness    both hands and feet    Past Surgical History:  Procedure Laterality Date  . abdominal adhesions removed    . ABDOMINAL HYSTERECTOMY  1978  . BUNIONECTOMY Left 2008  . COLONOSCOPY    . CYSTO WITH HYDRODISTENSION N/A 07/12/2015   Procedure: CYSTOSCOPY/HYDRODISTENSION;  Surgeon: Bjorn Loser, MD;  Location: [facility];  Service: Urology;  Laterality: N/A;  . ESOPHAGEAL MANOMETRY N/A 11/22/2014   Procedure: ESOPHAGEAL MANOMETRY (EM);  Surgeon: Winfield Cunas., MD;  Location: [facility];  Service: Endoscopy;  Laterality: N/A;  . ESOPHAGOGASTRODUODENOSCOPY N/A 07/15/2013   Procedure: ESOPHAGOGASTRODUODENOSCOPY (EGD);  Surgeon: Winfield Cunas., MD;  Location: [facility];  Service: Endoscopy;  Laterality: N/A;  need xray  . EYE SURGERY Bilateral  cataract surgery with lens implants  . HARDWARE REMOVAL N/A 01/02/2016   Procedure: Exploration of Lumbar Fusion,Removal of hardware Lumbar One-Two ;Redo Posterior Lumbar Fusion Lumbar One-Two;  Surgeon: Kary Kos, MD;  Location: [place] [facility];  Service: Neurosurgery;  Laterality: N/A;  . LAPAROSCOPIC CHOLECYSTECTOMY  01-11-2006  . LAPAROSCOPY N/A 07/06/2016   Procedure: LAPAROSCOPY DIAGNOSTIC EMERGENT TO  OPEN;  Surgeon: Greer Pickerel, MD;  Location: [place];  Service: General;  Laterality: N/A;  . LAPAROTOMY N/A 07/06/2016   Procedure: EXPLORATORY LAPAROTOMY LYSIS OF ADHESIONS FOR 3 HOURS;  Surgeon: Greer Pickerel, MD;  Location: [place];  Service: General;  Laterality: N/A;  . LUMBAR FUSION  2014   L4 -- L5  . MASS EXCISION Left 02/20/2013   Procedure: EXCISION LEFT BUTTOCK  MASS;  Surgeon: Adin Hector, MD;  Location: [facility];  Service: General;  Laterality: Left;  . NOSE SURGERY  2007  . REMOVAL HARDWARE L4-L5/  BILATERAL LAMINECTOMY L2 - L5 AND FUSION  06-12-2011  . SHOULDER OPEN ROTATOR CUFF REPAIR Left 05/05/2014   Procedure: OPEN ACROMIONECTOMY AND OPEN REPAIR OF ROTATOR CUFF, TISSUEMEND GRAFT WITH ANCHOR ;  Surgeon: Tobi Bastos, MD;  Location: [facility];  Service: Orthopedics;  Laterality: Left;  . SPINE SURGERY    . TONSILLECTOMY      Social History   Social History  . Marital status: Married    Spouse name: N/A  . Number of children: N/A  . Years of education: N/A   Occupational History  . Not on file.   Social History Main Topics  . Smoking status: Former Smoker    Quit date: 07/10/1969  . Smokeless tobacco: Never Used  . Alcohol use Yes     Comment: rare  . Drug use: No  . Sexual activity: Not on file   Other Topics Concern  . Not on file   Social History Narrative  . No narrative on file    Allergies  Allergen Reactions  . Latex Other (See Comments)    Sores, blisters - reaction to gloves  . Penicillins Other (See Comments)    Pt was told she was allergic to penicillin in her mid 20'2 - does not recall the reaction. She has taken amoxicillin and ampicillin with no reaction.     Family History  Problem Relation Age of Onset  . Hypertension Mother   . Cancer Mother     cervix  . Kidney disease Mother   . Hypertension Sister   . Cancer Sister     polyps  . Kidney disease Brother   . Cancer Daughter     leukemia  . Hypertension Brother     Prior to  Admission medications   Medication Sig Start Date End Date Taking? Authorizing Provider  acetaminophen (TYLENOL) 500 MG tablet Take 2 tablets (1,000 mg total) by mouth every 8 (eight) hours as needed (pain). 07/16/16  Yes Shanker Kristeen Mans, MD  B Complex Vitamins (VITAMIN B COMPLEX PO) Take 1 tablet by mouth daily.   Yes Historical Provider, MD  Calcium Carbonate-Vitamin D (CALCIUM-VITAMIN D3 PO) Take 1 tablet by mouth daily.   Yes Historical Provider, MD  cloNIDine (CATAPRES) 0.1 MG tablet Take 0.1 mg by mouth 3 (three) times daily.    Yes Historical Provider, MD  diphenhydrAMINE (BENADRYL) 25 mg capsule Take 25 mg by mouth 2 (two) times daily as needed for allergies.   Yes Historical Provider, MD  doxycycline (VIBRAMYCIN) 100 MG capsule Take 100 mg by  mouth 2 (two) times daily. For 7 days   Yes Historical Provider, MD  estradiol (ESTRACE) 0.1 MG/GM vaginal cream Place 1 Applicatorful vaginally 2 (two) times a week. Tuesdays and Thursdays   Yes Historical Provider, MD  fluticasone (FLONASE) 50 MCG/ACT nasal spray Place 1 spray into both nostrils daily.  06/14/16  Yes Historical Provider, MD  gabapentin (NEURONTIN) 300 MG capsule Take 300 mg by mouth 2 (two) times daily.   Yes Historical Provider, MD  hydrALAZINE (APRESOLINE) 25 MG tablet Take 25 mg by mouth 3 (three) times daily.   Yes Historical Provider, MD  L-Methylfolate-Algae-B12-B6 [place]) 3-90.314-2-35 MG CAPS Take 1 capsule by mouth 2 (two) times daily.    Yes Historical Provider, MD  meloxicam (MOBIC) 15 MG tablet Take 15 mg by mouth daily.  06/18/16  Yes Historical Provider, MD  Menthol, Topical Analgesic, (ICY HOT EX) Apply 1 application topically 2 (two) times daily as needed (pain). Gel   Yes Historical Provider, MD  metoprolol (LOPRESSOR) 50 MG tablet Take 1 tablet (50 mg total) by mouth 2 (two) times daily. 07/16/16  Yes Shanker Kristeen Mans, MD  Multiple Vitamins-Minerals (DECUBI-VITE PO) Take 1 tablet by mouth daily.   Yes Historical  Provider, MD  oxyCODONE (OXY IR/ROXICODONE) 5 MG immediate release tablet Take 1 tablet by mouth every 4 hours as needed for moderate pain take 2 tablets by mouth every 4 hours as needed for severe pain 07/20/16  Yes Tiffany L Reed, DO  pantoprazole (PROTONIX) 40 MG tablet Take 40 mg by mouth daily.    Yes Historical Provider, MD  PARoxetine (PAXIL) 40 MG tablet Take 40 mg by mouth daily. Reported on 01/18/2016 12/15/15  Yes Historical Provider, MD  polyethylene glycol (MIRALAX / GLYCOLAX) packet Take 17 g by mouth daily.    Yes Historical Provider, MD  sennosides-docusate sodium (SENOKOT-S) 8.6-50 MG tablet Take 2 tablets by mouth daily. Patient taking differently: Take 2 tablets by mouth 2 (two) times daily.  07/16/16  Yes Shanker Kristeen Mans, MD  silodosin (RAPAFLO) 8 MG CAPS capsule Take 8 mg by mouth daily as needed (difficulty urinating).   Yes Historical Provider, MD  UNABLE TO FIND Med Name: Med pass 90 mL 2 times daily   Yes Historical Provider, MD  vitamin E (VITAMIN E) 400 UNIT capsule Take 400 Units by mouth daily.   Yes Historical Provider, MD    Physical Exam: Vitals:   07/21/16 0800 07/21/16 0815 07/21/16 0830 07/21/16 0907  BP: 130/75 128/60 127/63 126/67  Pulse: 73 69 69 70  Resp: 13 13 14 13   Temp:    98.4 F (36.9 C)  TempSrc:    Oral  SpO2: 100% 99% 100% 97%     General:  Appears Obese somewhat lethargic will arouse to mild sternal rub and verbal stimuli Eyes:  PERRL, EOMI, normal lids, iris ENT:  grossly normal hearing, lips & tongue, mucous membranes of his mouth are pink and dry Neck:  no LAD, masses or thyromegaly Cardiovascular:  RRR, no m/r/g. No LE edema.  Respiratory:  Normal effort but somewhat shallow breath sounds diminished bilateral bases hear no crackles no wheezing Abdomen:  Obese soft positive bowel sounds diffuse tenderness,  Incision moderate to large amount of yellowish green foul-smelling purulent Drainage Skin:  no rash or induration seen on  limited exam Musculoskeletal:  grossly normal tone BUE/BLE, good ROM, no bony abnormality Psychiatric:  grossly normal mood and affect, speech fluent and appropriate, AOx3 Neurologic:  Somewhat lethargic and arouses  to verbal stimuli follows commands albeit slowly oozing all extremities oriented to self and place. Facial symmetry  Labs on Admission: I have personally reviewed following labs and imaging studies  CBC:  Recent Labs Lab 07/21/16 0123  WBC 8.0  NEUTROABS 5.9  HGB 9.7*  HCT 29.7*  MCV 90.8  PLT 123XX123*   Basic Metabolic Panel:  Recent Labs Lab 07/21/16 0011  NA 135  K 4.3  CL 98*  CO2 28  GLUCOSE 116*  BUN 7  CREATININE 0.76  CALCIUM 8.7*   GFR: Estimated Creatinine Clearance: 86.7 mL/min (by C-G formula based on SCr of 0.76 mg/dL). Liver Function Tests:  Recent Labs Lab 07/21/16 0011  AST 26  ALT 28  ALKPHOS 103  BILITOT 0.8  PROT 6.3*  ALBUMIN 2.5*   No results for input(s): LIPASE, AMYLASE in the last 168 hours. No results for input(s): AMMONIA in the last 168 hours. Coagulation Profile: No results for input(s): INR, PROTIME in the last 168 hours. Cardiac Enzymes: No results for input(s): CKTOTAL, CKMB, CKMBINDEX, TROPONINI in the last 168 hours. BNP (last 3 results) No results for input(s): PROBNP in the last 8760 hours. HbA1C: No results for input(s): HGBA1C in the last 72 hours. CBG:  Recent Labs Lab 07/15/16 2013 07/16/16 0008 07/16/16 0415 07/16/16 0752 07/16/16 1138  GLUCAP 90 91 96 90 99   Lipid Profile: No results for input(s): CHOL, HDL, LDLCALC, TRIG, CHOLHDL, LDLDIRECT in the last 72 hours. Thyroid Function Tests: No results for input(s): TSH, T4TOTAL, FREET4, T3FREE, THYROIDAB in the last 72 hours. Anemia Panel: No results for input(s): VITAMINB12, FOLATE, FERRITIN, TIBC, IRON, RETICCTPCT in the last 72 hours. Urine analysis:    Component Value Date/Time   COLORURINE YELLOW 07/21/2016 0038   APPEARANCEUR CLEAR  07/21/2016 0038   LABSPEC 1.003 (L) 07/21/2016 0038   PHURINE 6.0 07/21/2016 0038   GLUCOSEU NEGATIVE 07/21/2016 0038   HGBUR NEGATIVE 07/21/2016 0038   BILIRUBINUR NEGATIVE 07/21/2016 0038   KETONESUR NEGATIVE 07/21/2016 0038   PROTEINUR NEGATIVE 07/21/2016 0038   UROBILINOGEN 1.0 04/21/2014 0756   NITRITE NEGATIVE 07/21/2016 0038   LEUKOCYTESUR NEGATIVE 07/21/2016 0038    Creatinine Clearance: Estimated Creatinine Clearance: 86.7 mL/min (by C-G formula based on SCr of 0.76 mg/dL).  Sepsis Labs: @LABRCNTIP (procalcitonin:4,lacticidven:4) )No results found for this or any previous visit (from the past 240 hour(s)).   Radiological Exams on Admission: Ct Angio Chest Pe W Or Wo Contrast  Result Date: 07/21/2016 CLINICAL DATA:  Shortness of breath. Intra abdominal infection. Cellulitis. EXAM: CT ANGIOGRAPHY CHEST CT ABDOMEN AND PELVIS WITH CONTRAST TECHNIQUE: Multidetector CT imaging of the chest was performed using the standard protocol during bolus administration of intravenous contrast. Multiplanar CT image reconstructions and MIPs were obtained to evaluate the vascular anatomy. Multidetector CT imaging of the abdomen and pelvis was performed using the standard protocol during bolus administration of intravenous contrast. CONTRAST:  100 mL Isovue 370 COMPARISON:  None. FINDINGS: CTA CHEST FINDINGS Cardiovascular: Satisfactory opacification of the pulmonary arteries to the segmental level. No evidence of pulmonary embolism. Normal heart size. No pericardial effusion. Normal caliber thoracic aorta with mild atherosclerosis. No thoracic aortic dissection. Mediastinum/Nodes: No enlarged mediastinal, hilar, or axillary lymph nodes. Thyroid gland, trachea, and esophagus demonstrate no significant findings. Lungs/Pleura: Trace bilateral pleural effusions. Mild bibasilar atelectasis. No focal consolidation. No pneumothorax. Musculoskeletal: No acute osseous abnormality. Mild mid thoracic degenerative  disc disease. No aggressive lytic or sclerotic osseous lesion. Review of the MIP images confirms the above findings.  CT ABDOMEN and PELVIS FINDINGS Hepatobiliary: No focal liver abnormality is seen. Status post cholecystectomy. No biliary dilatation. Pancreas: Unremarkable. No pancreatic ductal dilatation or surrounding inflammatory changes. Spleen: Normal in size without focal abnormality. Adrenals/Urinary Tract: Adrenal glands are unremarkable. Kidneys are normal, without renal calculi, focal lesion, or hydronephrosis. Bladder is unremarkable. Stomach/Bowel: Midline anterior abdominal wound with air within the wound track and surrounding mild inflammatory changes concerning for cellulitis. No drainable fluid collection. Multiple loops of small bowel are matted along the anterior abdominal wall just beneath the incision site with wall thickening most concerning for enteritis. Mild distention of small bowel loops measuring approximately 3.6 cm in diameter concerning for partial small bowel obstruction. No pneumatosis, pneumoperitoneum or portal venous gas. Vascular/Lymphatic: Normal caliber abdominal aorta with atherosclerosis. No lymphadenopathy. Reproductive: Status post hysterectomy. No adnexal masses. Other: No fluid collection or hematoma. Musculoskeletal: Anterior lumbar interbody fusion at L4-5 and L5-S1 with solid osseous bridging. Posterior lumbar interbody fusion at L3-4 with solid osseous bridging across the L3-4 disc space and posterior elements. Posterior lumbar fusion at L2-3 with solid osseous fusion across the posterior elements. Posterior lumbar fusion at L1-2 with interbody prosthesis and laminectomy. No acute osseous abnormality. No aggressive lytic or sclerotic osseous lesion. Review of the MIP images confirms the above findings. IMPRESSION: 1. No of evidence of pulmonary embolus. 2. Trace bilateral pleural effusions and atelectasis. 3. Midline anterior abdominal wound with air within the wound  track and surrounding mild inflammatory changes concerning for cellulitis. No drainable fluid collection. 4. Multiple loops of small bowel are matted along the anterior abdominal wall just beneath the incision site with wall thickening most concerning for enteritis. Mild distention of small bowel loops measuring approximately 3.6 cm in diameter concerning for partial small bowel obstruction. Electronically Signed   By: Kathreen Devoid   On: 07/21/2016 08:15   Ct Abdomen Pelvis W Contrast  Result Date: 07/21/2016 CLINICAL DATA:  Shortness of breath. Intra abdominal infection. Cellulitis. EXAM: CT ANGIOGRAPHY CHEST CT ABDOMEN AND PELVIS WITH CONTRAST TECHNIQUE: Multidetector CT imaging of the chest was performed using the standard protocol during bolus administration of intravenous contrast. Multiplanar CT image reconstructions and MIPs were obtained to evaluate the vascular anatomy. Multidetector CT imaging of the abdomen and pelvis was performed using the standard protocol during bolus administration of intravenous contrast. CONTRAST:  100 mL Isovue 370 COMPARISON:  None. FINDINGS: CTA CHEST FINDINGS Cardiovascular: Satisfactory opacification of the pulmonary arteries to the segmental level. No evidence of pulmonary embolism. Normal heart size. No pericardial effusion. Normal caliber thoracic aorta with mild atherosclerosis. No thoracic aortic dissection. Mediastinum/Nodes: No enlarged mediastinal, hilar, or axillary lymph nodes. Thyroid gland, trachea, and esophagus demonstrate no significant findings. Lungs/Pleura: Trace bilateral pleural effusions. Mild bibasilar atelectasis. No focal consolidation. No pneumothorax. Musculoskeletal: No acute osseous abnormality. Mild mid thoracic degenerative disc disease. No aggressive lytic or sclerotic osseous lesion. Review of the MIP images confirms the above findings. CT ABDOMEN and PELVIS FINDINGS Hepatobiliary: No focal liver abnormality is seen. Status post  cholecystectomy. No biliary dilatation. Pancreas: Unremarkable. No pancreatic ductal dilatation or surrounding inflammatory changes. Spleen: Normal in size without focal abnormality. Adrenals/Urinary Tract: Adrenal glands are unremarkable. Kidneys are normal, without renal calculi, focal lesion, or hydronephrosis. Bladder is unremarkable. Stomach/Bowel: Midline anterior abdominal wound with air within the wound track and surrounding mild inflammatory changes concerning for cellulitis. No drainable fluid collection. Multiple loops of small bowel are matted along the anterior abdominal wall just beneath the incision site with  wall thickening most concerning for enteritis. Mild distention of small bowel loops measuring approximately 3.6 cm in diameter concerning for partial small bowel obstruction. No pneumatosis, pneumoperitoneum or portal venous gas. Vascular/Lymphatic: Normal caliber abdominal aorta with atherosclerosis. No lymphadenopathy. Reproductive: Status post hysterectomy. No adnexal masses. Other: No fluid collection or hematoma. Musculoskeletal: Anterior lumbar interbody fusion at L4-5 and L5-S1 with solid osseous bridging. Posterior lumbar interbody fusion at L3-4 with solid osseous bridging across the L3-4 disc space and posterior elements. Posterior lumbar fusion at L2-3 with solid osseous fusion across the posterior elements. Posterior lumbar fusion at L1-2 with interbody prosthesis and laminectomy. No acute osseous abnormality. No aggressive lytic or sclerotic osseous lesion. Review of the MIP images confirms the above findings. IMPRESSION: 1. No of evidence of pulmonary embolus. 2. Trace bilateral pleural effusions and atelectasis. 3. Midline anterior abdominal wound with air within the wound track and surrounding mild inflammatory changes concerning for cellulitis. No drainable fluid collection. 4. Multiple loops of small bowel are matted along the anterior abdominal wall just beneath the incision  site with wall thickening most concerning for enteritis. Mild distention of small bowel loops measuring approximately 3.6 cm in diameter concerning for partial small bowel obstruction. Electronically Signed   By: Kathreen Devoid   On: 07/21/2016 08:15   Dg Chest Port 1 View  Result Date: 07/21/2016 CLINICAL DATA:  Initial evaluation for acute shortness of breath. EXAM: PORTABLE CHEST 1 VIEW COMPARISON:  Prior radiograph from 07/10/2016. FINDINGS: Mild cardiomegaly is stable. Mediastinal silhouette within normal limits. Lungs are hypoinflated. Diffuse vascular congestion with indistinctness of the interstitial markings, suggesting mild/ early pulmonary edema. Superimposed linear opacity at the left lung base most compatible with atelectasis/ scar, similar to prior. No definite focal infiltrates. No pleural effusion. No pneumothorax. No acute osseus abnormality. IMPRESSION: 1. Stable cardiomegaly with mild diffuse pulmonary vascular congestion and indistinctness of the interstitial markings, suggesting mild and/or developing pulmonary edema. 2. Stable left basilar atelectasis and/or scar. Electronically Signed   By: Jeannine Boga M.D.   On: 07/21/2016 01:05    EKG: Pending  Assessment/Plan Principal Problem:   Acute respiratory failure (HCC) Active Problems:   Obstructive sleep apnea   HTN (hypertension)   Obesity, Class III, BMI 40-49.9 (morbid obesity) (HCC)   GERD (gastroesophageal reflux disease)   Constipation   Chronic pain   SBO (small bowel obstruction)   Acute encephalopathy   Anemia   1. Acute respiratory failure with hypoxia. Etiology unclear.  Upon presentation emergency department respiratory rate 38 oxygen saturation level 89%. No history COPD not on home oxygen. Unsure how close to presentation time she had pain medicine. BNP slightly elevated. Chest x-ray stable cardiomegaly with mild diffuse pulmonary vascular congestion concerning for pulmonary edema, CT chest with no PE  as noted above. At time of admission oxygen saturation level 100% on 2 L. ABG with a pH of 7.45, PCO2 45 PO2 117 bicarbonate 32. No respiratory distress -Admit to telemetry -Continue oxygen supplementation as indicated -Monitor oxygen saturation level -Nebulizers -Incentive spirometry -mobilize patient  -Minimize sedating medications -And oxygen as able  #2. Acute encephalopathy. Etiology uncertain may be related to lack of sleep for the last 2 nights. Initially quite lethargic.  ABG as noted above. No sedating medications provided during her time in the emergency department. No metabolic derangements. Temperature 100.3.  Neuro exam without focal deficits. Improving at time of admission -Hold sedating medications -See #1 -Monitor closely  #3. Abdominal pain/surgical incision drainage/postoperative cellulitis. Patient status  post diagnostic laparoscopy with emergent conversion to open small bowel obstructions due to adhesion. Abdominal pain drainage from incision site. Code sepsis called antibiotics initiated fluids deferred due to elevated BNP and hypoxia. CT of the abdomen midline anterior wound with air within the wound trace and surrounding mild inflammatory changes concerning for cellulitis. No drainable fluid collection -Surgical consult -continue antibiotics -defer to surgery  4. Anemia. Hemoglobin 9.2. 10 days ago hemoglobin 11.2. Status post abdominal surgery 2 weeks ago. No signs symptoms of active bleeding -Monitor  #5. Hypertension. Fair control in the emergency department. Home medications include clonidine, hydralazine, Toprol. -hold hydralazine for now -continue clonidine and metroprolol -monitor  #6. GERD. Stable at baseline. -Continue home meds  #7. Chronic pain. Home meds include neurontin, mobic, oxy IR -hold these for now -provide tramadol -monitor pain and adjust as indicated     DVT prophylaxis: scd  Code Status: dnr  Family Communication: husband at  bedside  Disposition Plan: bact to [facility] place  Consults called: general surgery who requested [facility] admit  for hypoxemia and can transfer to surgical service once resolved Admission status: obs   Radene Gunning MD [facility]  If 7PM-7AM, please contact night-coverage [email or web address] Password  [facility]  07/21/2016, 9:38 AM

## 2016-07-21 NOTE — ED Notes (Signed)
Pharmacy called attempting to get azactam.

## 2016-07-21 NOTE — ED Notes (Signed)
Pt in CT at this time.

## 2016-07-21 NOTE — ED Notes (Signed)
MD at bedside. 

## 2016-07-21 NOTE — Progress Notes (Signed)
Pharmacy Antibiotic Note  GABRIELA WARFIELD is a 70 y.o. female admitted on 07/20/2016 with abdominal pain.  Pharmacy has been consulted for Vancomycin/Aztreonam/Flagyl dosing for rule out intra-abdominal infection as well as cellulitis. WBC WNL. Renal function good.   Plan: -Vancomycin 1250 mg IV q12h -Aztreonam 1g IV q8h -Flagyl 500 mg IV q8h -Trend WBC, temp, renal function  -Drug levels as indicated  -Consider consolidating Aztreonam/Flagyl to Meropenem   Temp (24hrs), Avg:100.3 F (37.9 C), Min:100.3 F (37.9 C), Max:100.3 F (37.9 C)   Recent Labs Lab 07/21/16 0011 07/21/16 0035 07/21/16 0123  WBC  --   --  8.0  CREATININE 0.76  --   --   LATICACIDVEN  --  1.09  --     Estimated Creatinine Clearance: 86.7 mL/min (by C-G formula based on SCr of 0.76 mg/dL).    Allergies  Allergen Reactions  . Latex Other (See Comments)    Sores, blisters - reaction to gloves  . Penicillins Other (See Comments)    Pt was told she was allergic to penicillin in her mid 20'2 - does not recall the reaction. She has taken amoxicillin and ampicillin with no reaction.     Narda Bonds 07/21/2016 6:07 AM

## 2016-07-22 DIAGNOSIS — R1312 Dysphagia, oropharyngeal phase: Secondary | ICD-10-CM | POA: Diagnosis not present

## 2016-07-22 DIAGNOSIS — R262 Difficulty in walking, not elsewhere classified: Secondary | ICD-10-CM | POA: Diagnosis not present

## 2016-07-22 DIAGNOSIS — H409 Unspecified glaucoma: Secondary | ICD-10-CM | POA: Diagnosis present

## 2016-07-22 DIAGNOSIS — T8131XD Disruption of external operation (surgical) wound, not elsewhere classified, subsequent encounter: Secondary | ICD-10-CM | POA: Diagnosis not present

## 2016-07-22 DIAGNOSIS — K219 Gastro-esophageal reflux disease without esophagitis: Secondary | ICD-10-CM | POA: Diagnosis present

## 2016-07-22 DIAGNOSIS — Y848 Other medical procedures as the cause of abnormal reaction of the patient, or of later complication, without mention of misadventure at the time of the procedure: Secondary | ICD-10-CM | POA: Diagnosis present

## 2016-07-22 DIAGNOSIS — M6281 Muscle weakness (generalized): Secondary | ICD-10-CM | POA: Diagnosis not present

## 2016-07-22 DIAGNOSIS — M797 Fibromyalgia: Secondary | ICD-10-CM | POA: Diagnosis present

## 2016-07-22 DIAGNOSIS — T814XXA Infection following a procedure, initial encounter: Secondary | ICD-10-CM | POA: Diagnosis present

## 2016-07-22 DIAGNOSIS — F329 Major depressive disorder, single episode, unspecified: Secondary | ICD-10-CM | POA: Diagnosis present

## 2016-07-22 DIAGNOSIS — T8130XA Disruption of wound, unspecified, initial encounter: Secondary | ICD-10-CM | POA: Diagnosis present

## 2016-07-22 DIAGNOSIS — D649 Anemia, unspecified: Secondary | ICD-10-CM | POA: Diagnosis present

## 2016-07-22 DIAGNOSIS — Z841 Family history of disorders of kidney and ureter: Secondary | ICD-10-CM | POA: Diagnosis not present

## 2016-07-22 DIAGNOSIS — L03311 Cellulitis of abdominal wall: Secondary | ICD-10-CM | POA: Diagnosis present

## 2016-07-22 DIAGNOSIS — J9601 Acute respiratory failure with hypoxia: Secondary | ICD-10-CM

## 2016-07-22 DIAGNOSIS — Z87891 Personal history of nicotine dependence: Secondary | ICD-10-CM | POA: Diagnosis not present

## 2016-07-22 DIAGNOSIS — K56600 Partial intestinal obstruction, unspecified as to cause: Secondary | ICD-10-CM | POA: Diagnosis present

## 2016-07-22 DIAGNOSIS — Z86718 Personal history of other venous thrombosis and embolism: Secondary | ICD-10-CM | POA: Diagnosis not present

## 2016-07-22 DIAGNOSIS — I739 Peripheral vascular disease, unspecified: Secondary | ICD-10-CM | POA: Diagnosis present

## 2016-07-22 DIAGNOSIS — G4733 Obstructive sleep apnea (adult) (pediatric): Secondary | ICD-10-CM | POA: Diagnosis present

## 2016-07-22 DIAGNOSIS — R278 Other lack of coordination: Secondary | ICD-10-CM | POA: Diagnosis not present

## 2016-07-22 DIAGNOSIS — F419 Anxiety disorder, unspecified: Secondary | ICD-10-CM | POA: Diagnosis present

## 2016-07-22 DIAGNOSIS — Z8249 Family history of ischemic heart disease and other diseases of the circulatory system: Secondary | ICD-10-CM | POA: Diagnosis not present

## 2016-07-22 DIAGNOSIS — I10 Essential (primary) hypertension: Secondary | ICD-10-CM | POA: Diagnosis present

## 2016-07-22 DIAGNOSIS — Z48815 Encounter for surgical aftercare following surgery on the digestive system: Secondary | ICD-10-CM | POA: Diagnosis not present

## 2016-07-22 DIAGNOSIS — G9340 Encephalopathy, unspecified: Secondary | ICD-10-CM | POA: Diagnosis present

## 2016-07-22 DIAGNOSIS — Z90710 Acquired absence of both cervix and uterus: Secondary | ICD-10-CM | POA: Diagnosis not present

## 2016-07-22 DIAGNOSIS — J45909 Unspecified asthma, uncomplicated: Secondary | ICD-10-CM | POA: Diagnosis present

## 2016-07-22 DIAGNOSIS — Z806 Family history of leukemia: Secondary | ICD-10-CM | POA: Diagnosis not present

## 2016-07-22 DIAGNOSIS — G8929 Other chronic pain: Secondary | ICD-10-CM | POA: Diagnosis present

## 2016-07-22 DIAGNOSIS — Z66 Do not resuscitate: Secondary | ICD-10-CM | POA: Diagnosis present

## 2016-07-22 LAB — BASIC METABOLIC PANEL
Anion gap: 7 (ref 5–15)
BUN: 5 mg/dL — ABNORMAL LOW (ref 6–20)
CO2: 29 mmol/L (ref 22–32)
Calcium: 8.4 mg/dL — ABNORMAL LOW (ref 8.9–10.3)
Chloride: 103 mmol/L (ref 101–111)
Creatinine, Ser: 0.73 mg/dL (ref 0.44–1.00)
GFR calc Af Amer: >60 mL/min (ref >60)
GFR calc non Af Amer: >60 mL/min (ref >60)
Glucose, Bld: 93 mg/dL (ref 65–99)
Potassium: 3.3 mmol/L — ABNORMAL LOW (ref 3.5–5.1)
Sodium: 139 mmol/L (ref 135–145)

## 2016-07-22 LAB — URINE CULTURE

## 2016-07-22 LAB — CBC
HCT: 29.2 % — ABNORMAL LOW (ref 36.0–46.0)
Hemoglobin: 9.2 g/dL — ABNORMAL LOW (ref 12.0–15.0)
MCH: 29.5 pg (ref 26.0–34.0)
MCHC: 31.5 g/dL (ref 30.0–36.0)
MCV: 93.6 fL (ref 78.0–100.0)
Platelets: 522 10*3/uL — ABNORMAL HIGH (ref 150–400)
RBC: 3.12 MIL/uL — ABNORMAL LOW (ref 3.87–5.11)
RDW: 15.4 % (ref 11.5–15.5)
WBC: 5.7 10*3/uL (ref 4.0–10.5)

## 2016-07-22 LAB — MRSA PCR SCREENING: MRSA by PCR: NEGATIVE

## 2016-07-22 MED ORDER — ENOXAPARIN SODIUM 60 MG/0.6ML ~~LOC~~ SOLN
60.0000 mg | SUBCUTANEOUS | Status: DC
  Administered 2016-07-22 – 2016-08-01 (×11): 60 mg via SUBCUTANEOUS
  Filled 2016-07-22 (×14): qty 0.6

## 2016-07-22 MED ORDER — DICLOFENAC SODIUM 50 MG PO TBEC
50.0000 mg | DELAYED_RELEASE_TABLET | Freq: Three times a day (TID) | ORAL | Status: DC
  Administered 2016-07-22 – 2016-08-01 (×27): 50 mg via ORAL
  Filled 2016-07-22 (×34): qty 1

## 2016-07-22 MED ORDER — SODIUM CHLORIDE 0.9 % IV SOLN
1.0000 g | Freq: Three times a day (TID) | INTRAVENOUS | Status: DC
  Administered 2016-07-22 – 2016-07-24 (×6): 1 g via INTRAVENOUS
  Filled 2016-07-22 (×8): qty 1

## 2016-07-22 MED ORDER — POLYETHYLENE GLYCOL 3350 17 G PO PACK
17.0000 g | PACK | Freq: Two times a day (BID) | ORAL | Status: AC
  Administered 2016-07-22 (×2): 17 g via ORAL
  Filled 2016-07-22 (×2): qty 1

## 2016-07-22 NOTE — Evaluation (Signed)
Physical Therapy Evaluation Patient Details Name: Amy [name] MRN: [id number] DOB: 05-Jan-1946 Today's Date: 07/22/2016   History of Present Illness  Patient is a 70 yo female admitted 07/20/16 with acute respiratory failure with hypoxia, dehissance of abdominal wound with infection, metabolic encephalopathy.    PMHx:  SBO with lysis of adhesions, DOE, PVD, neuropathy, HTN, h/o DVT, fibromyalgia, and chronic back pain (s/p multiple back surgeries).   Clinical Impression  Patient presents with problems listed below.  Will benefit from acute PT to maximize functional mobility prior to discharge.  Recommend patient return to SNF for continued therapy at d/c.    Follow Up Recommendations SNF;Supervision/Assistance - 24 hour    Equipment Recommendations  None recommended by PT    Recommendations for Other Services       Precautions / Restrictions Precautions Precautions: Fall Precaution Comments: Open wound abdomen Restrictions Weight Bearing Restrictions: No      Mobility  Bed Mobility               General bed mobility comments: Patient in chair  Transfers Overall transfer level: Needs assistance Equipment used: Rolling walker (2 wheeled) Transfers: Sit to/from Stand Sit to Stand: Min assist         General transfer comment: Patient using correct hand placement.  Assist to rise to standing and to steady during transfers.  Ambulation/Gait Ambulation/Gait assistance: Min assist Ambulation Distance (Feet): 3 Feet Assistive device: Rolling walker (2 wheeled) Gait Pattern/deviations: Step-through pattern;Decreased step length - right;Decreased step length - left;Decreased stride length;Shuffle;Trunk flexed Gait velocity: significantly decreased Gait velocity interpretation: Below normal speed for age/gender General Gait Details: Patient with very slow gait speed, taking short, shuffling steps.  Cues to open eyes during gait.  Patient ambulated 3' forward  and 3' backward with min assist for balance.  Stairs            Wheelchair Mobility    Modified Rankin (Stroke Patients Only)       Balance           Standing balance support: Bilateral upper extremity supported Standing balance-Leahy Scale: Poor                               Pertinent Vitals/Pain Pain Assessment: 0-10 Pain Score: 7  Pain Location: 7-abdomen; 5-back Pain Descriptors / Indicators: Aching;Sore Pain Intervention(s): Limited activity within patient's tolerance;Monitored during session;Repositioned    Home Living Family/patient expects to be discharged to:: Skilled nursing facility (From [facility])               Home Equipment: Kasandra Knudsen - single point;Bedside commode;Shower seat;Other (comment);Walker - 2 wheels (adjustable bed)      Prior Function Level of Independence: Needs assistance   Gait / Transfers Assistance Needed: Working with PT at SNF on gait with RW  ADL's / Homemaking Assistance Needed: Assist for ADL's        Hand Dominance   Dominant Hand: Right    Extremity/Trunk Assessment   Upper Extremity Assessment: Generalized weakness           Lower Extremity Assessment: Generalized weakness      Cervical / Trunk Assessment: Other exceptions  Communication   Communication: No difficulties  Cognition Arousal/Alertness: Awake/alert Behavior During Therapy: WFL for tasks assessed/performed Overall Cognitive Status: No family/caregiver present to determine baseline cognitive functioning  General Comments      Exercises     Assessment/Plan    PT Assessment Patient needs continued PT services  PT Problem List Decreased strength;Decreased activity tolerance;Decreased balance;Decreased mobility;Decreased knowledge of use of DME;Cardiopulmonary status limiting activity;Obesity;Pain;Decreased skin integrity          PT Treatment Interventions DME instruction;Gait  training;Functional mobility training;Therapeutic activities;Therapeutic exercise;Balance training;Patient/family education    PT Goals (Current goals can be found in the Care Plan section)  Acute Rehab PT Goals Patient Stated Goal: pt wants to decrease pain PT Goal Formulation: With patient Time For Goal Achievement: 07/29/16 Potential to Achieve Goals: Good    Frequency Min 3X/week   Barriers to discharge        Co-evaluation               End of Session Equipment Utilized During Treatment: Oxygen Activity Tolerance: Patient limited by pain;Patient limited by fatigue Patient left: in chair;with call bell/phone within reach Nurse Communication: Mobility status         Time: [id number] PT Time Calculation (min) (ACUTE ONLY): 17 min   Charges:   PT Evaluation $PT Eval Moderate Complexity: 1 Procedure     PT G Codes:        Despina Pole 08/06/16, 7:30 PM Carita Pian. Sanjuana Kava, [place] Pager [phone]

## 2016-07-22 NOTE — Progress Notes (Signed)
Subjective: Stable and alert.  Has slept fairly well during night.  Pain control adequate adequate with morphine No nausea or vomiting No stool for 5 days, but passing flatus Dressing was changed late last night by nursing and that went well  Wound cultures so far are polymicrobial.  Objective: Vital signs in last 24 hours: Temp:  [98.4 F (36.9 C)-98.8 F (37.1 C)] 98.4 F (36.9 C) (10/29 0510) Pulse Rate:  [68-80] 70 (10/29 0510) Resp:  [13-20] 18 (10/29 0510) BP: (119-143)/(59-87) 127/59 (10/29 0510) SpO2:  [93 %-100 %] 97 % (10/29 0510) Weight:  [126.1 kg (278 lb 1.6 oz)-126.6 kg (279 lb 1.6 oz)] 126.6 kg (279 lb 1.6 oz) (10/28 2211)    Intake/Output from previous day: 10/28 0701 - 10/29 0700 In: 720 [P.O.:720] Out: 400 [Urine:400] Intake/Output this shift: Total I/O In: 360 [P.O.:360] Out: 0   General appearance: Very pleasant.  Fatigued.  Minimal distress.  Cooperative. GI: Soft.  Mild diffuse tenderness.  Obese.  Midline wound packed open.  Dressing clean and dry without odor.  Lab Results:   Recent Labs  07/21/16 0123  WBC 8.0  HGB 9.7*  HCT 29.7*  PLT 583*   BMET  Recent Labs  07/21/16 0011  NA 135  K 4.3  CL 98*  CO2 28  GLUCOSE 116*  BUN 7  CREATININE 0.76  CALCIUM 8.7*   PT/INR No results for input(s): LABPROT, INR in the last 72 hours. ABG  Recent Labs  07/21/16 0941  PHART 7.458*  HCO3 32.0*    Studies/Results: Ct Angio Chest Pe W Or Wo Contrast  Result Date: 07/21/2016 CLINICAL DATA:  Shortness of breath. Intra abdominal infection. Cellulitis. EXAM: CT ANGIOGRAPHY CHEST CT ABDOMEN AND PELVIS WITH CONTRAST TECHNIQUE: Multidetector CT imaging of the chest was performed using the standard protocol during bolus administration of intravenous contrast. Multiplanar CT image reconstructions and MIPs were obtained to evaluate the vascular anatomy. Multidetector CT imaging of the abdomen and pelvis was performed using the standard  protocol during bolus administration of intravenous contrast. CONTRAST:  100 mL Isovue 370 COMPARISON:  None. FINDINGS: CTA CHEST FINDINGS Cardiovascular: Satisfactory opacification of the pulmonary arteries to the segmental level. No evidence of pulmonary embolism. Normal heart size. No pericardial effusion. Normal caliber thoracic aorta with mild atherosclerosis. No thoracic aortic dissection. Mediastinum/Nodes: No enlarged mediastinal, hilar, or axillary lymph nodes. Thyroid gland, trachea, and esophagus demonstrate no significant findings. Lungs/Pleura: Trace bilateral pleural effusions. Mild bibasilar atelectasis. No focal consolidation. No pneumothorax. Musculoskeletal: No acute osseous abnormality. Mild mid thoracic degenerative disc disease. No aggressive lytic or sclerotic osseous lesion. Review of the MIP images confirms the above findings. CT ABDOMEN and PELVIS FINDINGS Hepatobiliary: No focal liver abnormality is seen. Status post cholecystectomy. No biliary dilatation. Pancreas: Unremarkable. No pancreatic ductal dilatation or surrounding inflammatory changes. Spleen: Normal in size without focal abnormality. Adrenals/Urinary Tract: Adrenal glands are unremarkable. Kidneys are normal, without renal calculi, focal lesion, or hydronephrosis. Bladder is unremarkable. Stomach/Bowel: Midline anterior abdominal wound with air within the wound track and surrounding mild inflammatory changes concerning for cellulitis. No drainable fluid collection. Multiple loops of small bowel are matted along the anterior abdominal wall just beneath the incision site with wall thickening most concerning for enteritis. Mild distention of small bowel loops measuring approximately 3.6 cm in diameter concerning for partial small bowel obstruction. No pneumatosis, pneumoperitoneum or portal venous gas. Vascular/Lymphatic: Normal caliber abdominal aorta with atherosclerosis. No lymphadenopathy. Reproductive: Status post  hysterectomy. No adnexal masses. Other:  No fluid collection or hematoma. Musculoskeletal: Anterior lumbar interbody fusion at L4-5 and L5-S1 with solid osseous bridging. Posterior lumbar interbody fusion at L3-4 with solid osseous bridging across the L3-4 disc space and posterior elements. Posterior lumbar fusion at L2-3 with solid osseous fusion across the posterior elements. Posterior lumbar fusion at L1-2 with interbody prosthesis and laminectomy. No acute osseous abnormality. No aggressive lytic or sclerotic osseous lesion. Review of the MIP images confirms the above findings. IMPRESSION: 1. No of evidence of pulmonary embolus. 2. Trace bilateral pleural effusions and atelectasis. 3. Midline anterior abdominal wound with air within the wound track and surrounding mild inflammatory changes concerning for cellulitis. No drainable fluid collection. 4. Multiple loops of small bowel are matted along the anterior abdominal wall just beneath the incision site with wall thickening most concerning for enteritis. Mild distention of small bowel loops measuring approximately 3.6 cm in diameter concerning for partial small bowel obstruction. Electronically Signed   By: Kathreen Devoid   On: 07/21/2016 08:15   Ct Abdomen Pelvis W Contrast  Result Date: 07/21/2016 CLINICAL DATA:  Shortness of breath. Intra abdominal infection. Cellulitis. EXAM: CT ANGIOGRAPHY CHEST CT ABDOMEN AND PELVIS WITH CONTRAST TECHNIQUE: Multidetector CT imaging of the chest was performed using the standard protocol during bolus administration of intravenous contrast. Multiplanar CT image reconstructions and MIPs were obtained to evaluate the vascular anatomy. Multidetector CT imaging of the abdomen and pelvis was performed using the standard protocol during bolus administration of intravenous contrast. CONTRAST:  100 mL Isovue 370 COMPARISON:  None. FINDINGS: CTA CHEST FINDINGS Cardiovascular: Satisfactory opacification of the pulmonary arteries to  the segmental level. No evidence of pulmonary embolism. Normal heart size. No pericardial effusion. Normal caliber thoracic aorta with mild atherosclerosis. No thoracic aortic dissection. Mediastinum/Nodes: No enlarged mediastinal, hilar, or axillary lymph nodes. Thyroid gland, trachea, and esophagus demonstrate no significant findings. Lungs/Pleura: Trace bilateral pleural effusions. Mild bibasilar atelectasis. No focal consolidation. No pneumothorax. Musculoskeletal: No acute osseous abnormality. Mild mid thoracic degenerative disc disease. No aggressive lytic or sclerotic osseous lesion. Review of the MIP images confirms the above findings. CT ABDOMEN and PELVIS FINDINGS Hepatobiliary: No focal liver abnormality is seen. Status post cholecystectomy. No biliary dilatation. Pancreas: Unremarkable. No pancreatic ductal dilatation or surrounding inflammatory changes. Spleen: Normal in size without focal abnormality. Adrenals/Urinary Tract: Adrenal glands are unremarkable. Kidneys are normal, without renal calculi, focal lesion, or hydronephrosis. Bladder is unremarkable. Stomach/Bowel: Midline anterior abdominal wound with air within the wound track and surrounding mild inflammatory changes concerning for cellulitis. No drainable fluid collection. Multiple loops of small bowel are matted along the anterior abdominal wall just beneath the incision site with wall thickening most concerning for enteritis. Mild distention of small bowel loops measuring approximately 3.6 cm in diameter concerning for partial small bowel obstruction. No pneumatosis, pneumoperitoneum or portal venous gas. Vascular/Lymphatic: Normal caliber abdominal aorta with atherosclerosis. No lymphadenopathy. Reproductive: Status post hysterectomy. No adnexal masses. Other: No fluid collection or hematoma. Musculoskeletal: Anterior lumbar interbody fusion at L4-5 and L5-S1 with solid osseous bridging. Posterior lumbar interbody fusion at L3-4 with solid  osseous bridging across the L3-4 disc space and posterior elements. Posterior lumbar fusion at L2-3 with solid osseous fusion across the posterior elements. Posterior lumbar fusion at L1-2 with interbody prosthesis and laminectomy. No acute osseous abnormality. No aggressive lytic or sclerotic osseous lesion. Review of the MIP images confirms the above findings. IMPRESSION: 1. No of evidence of pulmonary embolus. 2. Trace bilateral pleural effusions and atelectasis. 3.  Midline anterior abdominal wound with air within the wound track and surrounding mild inflammatory changes concerning for cellulitis. No drainable fluid collection. 4. Multiple loops of small bowel are matted along the anterior abdominal wall just beneath the incision site with wall thickening most concerning for enteritis. Mild distention of small bowel loops measuring approximately 3.6 cm in diameter concerning for partial small bowel obstruction. Electronically Signed   By: Kathreen Devoid   On: 07/21/2016 08:15   Dg Chest Port 1 View  Result Date: 07/21/2016 CLINICAL DATA:  Initial evaluation for acute shortness of breath. EXAM: PORTABLE CHEST 1 VIEW COMPARISON:  Prior radiograph from 07/10/2016. FINDINGS: Mild cardiomegaly is stable. Mediastinal silhouette within normal limits. Lungs are hypoinflated. Diffuse vascular congestion with indistinctness of the interstitial markings, suggesting mild/ early pulmonary edema. Superimposed linear opacity at the left lung base most compatible with atelectasis/ scar, similar to prior. No definite focal infiltrates. No pleural effusion. No pneumothorax. No acute osseus abnormality. IMPRESSION: 1. Stable cardiomegaly with mild diffuse pulmonary vascular congestion and indistinctness of the interstitial markings, suggesting mild and/or developing pulmonary edema. 2. Stable left basilar atelectasis and/or scar. Electronically Signed   By: Jeannine Boga M.D.   On: 07/21/2016 01:05     Anti-infectives: Anti-infectives    Start     Dose/Rate Route Frequency Ordered Stop   07/21/16 1300  vancomycin (VANCOCIN) 1,250 mg in sodium chloride 0.9 % 250 mL IVPB     1,250 mg 166.7 mL/hr over 90 Minutes Intravenous Every 12 hours 07/21/16 1028     07/21/16 1030  aztreonam (AZACTAM) 1 g in dextrose 5 % 50 mL IVPB     1 g 100 mL/hr over 30 Minutes Intravenous Every 8 hours 07/21/16 1028     07/21/16 1000  vancomycin (VANCOCIN) 1,250 mg in sodium chloride 0.9 % 250 mL IVPB  Status:  Discontinued     1,250 mg 166.7 mL/hr over 90 Minutes Intravenous Every 12 hours 07/21/16 0613 07/21/16 1028   07/21/16 1000  aztreonam (AZACTAM) 1 g in dextrose 5 % 50 mL IVPB  Status:  Discontinued     1 g 100 mL/hr over 30 Minutes Intravenous Every 8 hours 07/21/16 0613 07/21/16 1028   07/21/16 0800  metroNIDAZOLE (FLAGYL) IVPB 500 mg     500 mg 100 mL/hr over 60 Minutes Intravenous Every 8 hours 07/21/16 0613     07/20/16 2345  aztreonam (AZACTAM) 2 g in dextrose 5 % 50 mL IVPB     2 g 100 mL/hr over 30 Minutes Intravenous  Once 07/20/16 2343 07/21/16 0108   07/20/16 2345  metroNIDAZOLE (FLAGYL) IVPB 500 mg     500 mg 100 mL/hr over 60 Minutes Intravenous  Once 07/20/16 2343 07/21/16 0144   07/20/16 2345  vancomycin (VANCOCIN) IVPB 1000 mg/200 mL premix     1,000 mg 200 mL/hr over 60 Minutes Intravenous  Once 07/20/16 2343 07/21/16 0456      Assessment/Plan:   Abdominal wound infection S/p diagnostic laparoscopy emergent coversion to open with LOA x 3 hours on 10/13, Dr. Greer Pickerel - CT scan shows no deep fluid collection -Wound was opened down to the fascia yesterday.  Fascia intact, but diffuse purulence. -Cultures polymicrobial.  Continue current antibiotics pending sensitivities.  CT evidence of early, partial small bowel obstruction Clinically she does not appear obstructed. Bowel regimen with Senokot twice a day started.  Will give her some MiraLAX today.  Acute  respiratory failure - mild pulmonary effusion on CT - hypoxemia,  tachypnea at presentation to ED - mild elevation in BNP  Anemia HTN Chronic pain GERD  FEN: heart healthy, IVF ID: aztreonam, flagyl VTE: SCD's, Lovenox per pharmacy  Plan:  Admitted to medicine for management medical multiple medical problems Nursing staff to change dressing twice a day for now Wound and ostomy consult requested for optimization of wound care and eventual transition to negative pressure dressing. Begin Lovenox per pharmacy for DVT prophylaxis.   LOS: 0 days    Jediah Horger M 07/22/2016

## 2016-07-22 NOTE — Progress Notes (Signed)
Amy [name] AST:[id number] DOB: Jan 31, 1946 DOA: 07/20/2016 PCP: Blanchie Serve, MD  Brief narrative:  83 ? Prior UTI;s Decompressive laminectomy fusion L1-L2 pseodoarthrosis  L3-L4 and s/p re-do fusion 12/2015 Prior L rotator cuf tear s/p repair 2015 S/p removal subq mass L buttocks Sinus surgery 2008 Mild OSA/OHSS not using CPAP  Recent admission for Small bowel obstruction and had lysis of adhesion Admitted with pruurlent drainage   Past medical history-As per Problem list Chart reviewed as below-   Consultants:  Gen surg  Procedures:    Antibiotics:  meropenem 10/29  Vanc 10/28   Subjective  A little sleepy Had some pain when seen earlier No cp No fever   Objective     Objective: Vitals:   07/21/16 1708 07/21/16 2157 07/21/16 2211 07/22/16 0510  BP: (!) 137/59  125/87 (!) 127/59  Pulse: 80  73 70  Resp: _0 Temp: 98.6 F (37 C)  98.7 F (37.1 C) 98.4 F (36.9 C)  TempSrc: Oral  Oral Oral  SpO2: 94% 98% 99% 97%  Weight:   126.6 kg (279 lb 1.6 oz)   Height:        Intake/Output Summary (Last 24 hours) at 07/22/16 0748 Last data filed at 07/22/16 0616  Gross per 24 hour  Intake             1670 ml  Output              400 ml  Net             1270 ml    Exam:  General: eomi ncat slightly sleepy Cardiovascular: s1 s2 no m/r/g, RRR Respiratory: clear no addedsoud Abdomen: wound not visualized Skin see prior surgical note Neuro intact but patient sleepy  Data Reviewed: Basic Metabolic Panel:  Recent Labs Lab 07/21/16 0011  NA 135  K 4.3  CL 98*  CO2 28  GLUCOSE 116*  BUN 7  CREATININE 0.76  CALCIUM 8.7*   Liver Function Tests:  Recent Labs Lab 07/21/16 0011  AST 26  ALT 28  ALKPHOS 103  BILITOT 0.8  PROT 6.3*  ALBUMIN 2.5*   No results for input(s): LIPASE, AMYLASE in the last 168 hours. No results for input(s): AMMONIA in the last 168 hours. CBC:  Recent Labs Lab 07/21/16 0123  07/22/16 0550  WBC 8.0 5.7  NEUTROABS 5.9  --   HGB 9.7* 9.2*  HCT 29.7* 29.2*  MCV 90.8 93.6  PLT 583* 522*   Cardiac Enzymes: No results for input(s): CKTOTAL, CKMB, CKMBINDEX, TROPONINI in the last 168 hours. BNP: Invalid input(s): POCBNP CBG:  Recent Labs Lab 07/15/16 2013 07/16/16 0008 07/16/16 0415 07/16/16 0752 07/16/16 1138  GLUCAP 90 91 96 90 99    Recent Results (from the past 240 hour(s))  Aerobic Culture (superficial specimen)     Status: None (Preliminary result)   Collection Time: 07/21/16  1:44 PM  Result Value Ref Range Status   Specimen Description WOUND ABDOMEN  Final   Special Requests NONE  Final   Gram Stain   Final    MODERATE WBC PRESENT, PREDOMINANTLY PMN RARE SQUAMOUS EPITHELIAL CELLS PRESENT ABUNDANT GRAM POSITIVE COCCI IN PAIRS AND CHAINS MODERATE GRAM NEGATIVE RODS RARE GRAM POSITIVE RODS    Culture PENDING  Incomplete   Report Status PENDING  Incomplete  MRSA PCR Screening     Status: None   Collection Time: 07/21/16 10:22 PM  Result Value Ref Range Status  MRSA by PCR NEGATIVE NEGATIVE Final    Comment:        The GeneXpert MRSA Assay (FDA approved for NASAL specimens only), is one component of a comprehensive MRSA colonization surveillance program. It is not intended to diagnose MRSA infection nor to guide or monitor treatment for MRSA infections.      Studies:              All Imaging reviewed and is as per above notation   Scheduled Meds: . aztreonam  1 g Intravenous Q8H  . calcium-vitamin D  1 tablet Oral Q breakfast  . cloNIDine  0.1 mg Oral TID  . enoxaparin (LOVENOX) injection  60 mg Subcutaneous Q24H  . [START ON 07/23/2016] estradiol  1 Applicatorful Vaginal Once per day on Mon Thu  . fluticasone  1 spray Each Nare Daily  . l-methylfolate-B6-B12  1 tablet Oral BID  . metoprolol  50 mg Oral BID  . metronidazole  500 mg Intravenous Q8H  . PARoxetine  40 mg Oral Daily  . polyethylene glycol  17 g Oral Daily   . polyethylene glycol  17 g Oral BID  . senna  2 tablet Oral BID  . senna-docusate  2 tablet Oral BID  . sodium chloride flush  3 mL Intravenous Q12H  . tamsulosin  0.4 mg Oral QPC supper  . vancomycin  1,250 mg Intravenous Q12H   Continuous Infusions:    Assessment/Plan:  1. Met encephalopathy-has h/o of OSA not on biapa-avoid opiates and narcotics, suggest Diclofenac and will change orders to reflect.  If needed use breakthrough moprhine IV sparingly 2. Dehisced wopund-per gen surgery 3. Htn-cont clonidine 0.1 tid, Metoprolol 50 bid-contrll 4. I am not convinced she has any pneumonic process  Request Gen surgery take over in am 10/30 and if needed we can follow as consult.  thanks    Only patient Present Full code inpatient  Jai Samtani, MD  [facility] Pager [phone] 07/22/2016, 7:48 AM    LOS: 0 days     

## 2016-07-22 NOTE — Progress Notes (Signed)
Pharmacy Antibiotic Note  Amy [name] is a 70 y.o. female admitted on 07/20/2016 with abdominal pain. Pt with hx recent SBO repair + LOA on 10/10 + 10/13 and is now noted to have post-op wound drainage.   The wound was opened down to the fascia by surgery on 10/28 and purulent fluid drained. CT did not show a deep fluid collection.   The patient has a PCN allergy listed but has tolerated amox/amp, and cephalosporins previously without any issues. Discussed with the MD and will d/c Azactam/flagyl and transition the patient to Meropenem + Vancomycin pending culture results.   Plan: - D/c Azactam/Flagyl - Start Meropenem 1g IV every 8 hours - Cont Vanc 1250 mg IV every 12 hours for now - Will continue to follow renal function, culture results, LOT, and antibiotic de-escalation plans   Temp (24hrs), Avg:98.6 F (37 C), Min:98.4 F (36.9 C), Max:98.7 F (37.1 C)   Recent Labs Lab 07/21/16 0011 07/21/16 0035 07/21/16 0123 07/22/16 0550  WBC  --   --  8.0 5.7  CREATININE 0.76  --   --  0.73  LATICACIDVEN  --  1.09  --   --     Estimated Creatinine Clearance: 88.4 mL/min (by C-G formula based on SCr of 0.73 mg/dL).    Allergies  Allergen Reactions  . Latex Other (See Comments)    Sores, blisters - reaction to gloves  . Penicillins Other (See Comments)    Pt was told she was allergic to penicillin in her mid 20'2 - does not recall the reaction. She has taken amoxicillin and ampicillin with no reaction.    Antimicrobials this admission:  Vanc 10/28 >> Aztreonam 10/28 >> 10/29 Flagyl 10/28 >> 10/29 Mero 10/29 >>  Dose adjustments this admission:    Microbiology results:  10/28 BCx >> 10/28 UCx >> 10/28 WCx >> gram stain showing GPC in pairs/chains + GNR   Thank you for allowing pharmacy to be a part of this patient's care.  Alycia Rossetti, PharmD, BCPS Clinical Pharmacist Pager: [phone] 07/22/2016 1:36 PM

## 2016-07-23 LAB — CBC
HCT: 29.6 % — ABNORMAL LOW (ref 36.0–46.0)
Hemoglobin: 9.4 g/dL — ABNORMAL LOW (ref 12.0–15.0)
MCH: 29.3 pg (ref 26.0–34.0)
MCHC: 31.8 g/dL (ref 30.0–36.0)
MCV: 92.2 fL (ref 78.0–100.0)
Platelets: 532 10*3/uL — ABNORMAL HIGH (ref 150–400)
RBC: 3.21 MIL/uL — ABNORMAL LOW (ref 3.87–5.11)
RDW: 15.1 % (ref 11.5–15.5)
WBC: 5.5 10*3/uL (ref 4.0–10.5)

## 2016-07-23 LAB — COMPREHENSIVE METABOLIC PANEL
ALT: 15 U/L (ref 14–54)
AST: 11 U/L — ABNORMAL LOW (ref 15–41)
Albumin: 2.3 g/dL — ABNORMAL LOW (ref 3.5–5.0)
Alkaline Phosphatase: 85 U/L (ref 38–126)
Anion gap: 6 (ref 5–15)
BUN: 9 mg/dL (ref 6–20)
CO2: 29 mmol/L (ref 22–32)
Calcium: 8.3 mg/dL — ABNORMAL LOW (ref 8.9–10.3)
Chloride: 104 mmol/L (ref 101–111)
Creatinine, Ser: 0.86 mg/dL (ref 0.44–1.00)
GFR calc Af Amer: >60 mL/min (ref >60)
GFR calc non Af Amer: >60 mL/min (ref >60)
Glucose, Bld: 93 mg/dL (ref 65–99)
Potassium: 3.5 mmol/L (ref 3.5–5.1)
Sodium: 139 mmol/L (ref 135–145)
Total Bilirubin: 0.4 mg/dL (ref 0.3–1.2)
Total Protein: 5.9 g/dL — ABNORMAL LOW (ref 6.5–8.1)

## 2016-07-23 NOTE — Consult Note (Signed)
WOC consulted for wound management of midline surgical wound.  CCS PA assessed wound this am and has requested NPWT dressing to be placed in the am.  Supplies ordered for application.  WOC will see patient 07/24/16 for placement.  Verified CCS team does not need to assess wound in the am prior to placement.  Melody [facility] MSN, [place], [place], [place]

## 2016-07-23 NOTE — Progress Notes (Signed)
[facility] Progress Note     Subjective: C/o mild abdominal pain, controlled with pain medication.  Denies fever, chills, nausea, vomiting. Having flatus. Denies BM.  Working with therapies.  Objective: Vital signs in last 24 hours: Temp:  [97.7 F (36.5 C)-98.6 F (37 C)] 97.7 F (36.5 C) (10/30 0509) Pulse Rate:  [63-79] 63 (10/30 0509) Resp:  [17-19] 17 (10/30 0509) BP: (124-143)/(58-75) 124/63 (10/30 0509) SpO2:  [96 %-99 %] 99 % (10/30 0509) Weight:  [280 lb 9.6 oz (127.3 kg)] 280 lb 9.6 oz (127.3 kg) (10/29 2109) Last BM Date: 07/15/16  Intake/Output from previous day: 10/29 0701 - 10/30 0700 In: 2120 [P.O.:1320; IV Piggyback:800] Out: 500 [Urine:500] Intake/Output this shift: No intake/output data recorded.  PE: Gen:  sleepy, NAD, pleasant Pulm:  CTA, no W/R/R Abd: Soft, appropriately tender, +BS, some yellow slough at wound base, which I partially debrided at bedside. No areas of tracking/tunneling.      Ext:  No erythema, edema, or tenderness   Lab Results:   Recent Labs  07/22/16 0550 07/23/16 0602  WBC 5.7 5.5  HGB 9.2* 9.4*  HCT 29.2* 29.6*  PLT 522* 532*   BMET  Recent Labs  07/22/16 0550 07/23/16 0602  NA 139 139  K 3.3* 3.5  CL 103 104  CO2 29 29  GLUCOSE 93 93  BUN 5* 9  CREATININE 0.73 0.86  CALCIUM 8.4* 8.3*   CMP     Component Value Date/Time   NA 139 07/23/2016 0602   NA 140 02/14/2016   K 3.5 07/23/2016 0602   CL 104 07/23/2016 0602   CO2 29 07/23/2016 0602   GLUCOSE 93 07/23/2016 0602   BUN 9 07/23/2016 0602   BUN 13 02/14/2016   CREATININE 0.86 07/23/2016 0602   CALCIUM 8.3 (L) 07/23/2016 0602   PROT 5.9 (L) 07/23/2016 0602   ALBUMIN 2.3 (L) 07/23/2016 0602   AST 11 (L) 07/23/2016 0602   ALT 15 07/23/2016 0602   ALKPHOS 85 07/23/2016 0602   BILITOT 0.4 07/23/2016 0602   GFRNONAA >60 07/23/2016 0602   GFRAA >60 07/23/2016 0602   Lipase     Component Value Date/Time   LIPASE 61 (H) 07/04/2016  0318   Anti-infectives: Anti-infectives    Start     Dose/Rate Route Frequency Ordered Stop   07/22/16 1400  meropenem (MERREM) 1 g in sodium chloride 0.9 % 100 mL IVPB     1 g 200 mL/hr over 30 Minutes Intravenous Every 8 hours 07/22/16 1338     07/21/16 1300  vancomycin (VANCOCIN) 1,250 mg in sodium chloride 0.9 % 250 mL IVPB     1,250 mg 166.7 mL/hr over 90 Minutes Intravenous Every 12 hours 07/21/16 1028     07/21/16 1030  aztreonam (AZACTAM) 1 g in dextrose 5 % 50 mL IVPB  Status:  Discontinued     1 g 100 mL/hr over 30 Minutes Intravenous Every 8 hours 07/21/16 1028 07/22/16 1337   07/21/16 1000  vancomycin (VANCOCIN) 1,250 mg in sodium chloride 0.9 % 250 mL IVPB  Status:  Discontinued     1,250 mg 166.7 mL/hr over 90 Minutes Intravenous Every 12 hours 07/21/16 0613 07/21/16 1028   07/21/16 1000  aztreonam (AZACTAM) 1 g in dextrose 5 % 50 mL IVPB  Status:  Discontinued     1 g 100 mL/hr over 30 Minutes Intravenous Every 8 hours 07/21/16 0613 07/21/16 1028   07/21/16 0800  metroNIDAZOLE (FLAGYL) IVPB 500 mg  Status:  Discontinued     500 mg 100 mL/hr over 60 Minutes Intravenous Every 8 hours 07/21/16 0613 07/22/16 1337   07/20/16 2345  aztreonam (AZACTAM) 2 g in dextrose 5 % 50 mL IVPB     2 g 100 mL/hr over 30 Minutes Intravenous  Once 07/20/16 2343 07/21/16 0108   07/20/16 2345  metroNIDAZOLE (FLAGYL) IVPB 500 mg     500 mg 100 mL/hr over 60 Minutes Intravenous  Once 07/20/16 2343 07/21/16 0144   07/20/16 2345  vancomycin (VANCOCIN) IVPB 1000 mg/200 mL premix     1,000 mg 200 mL/hr over 60 Minutes Intravenous  Once 07/20/16 2343 07/21/16 0456     Assessment/Plan Abdominal wound infection S/p diagnostic laparoscopy emergent coversion to open with LOA x 3 hours on 10/13, Dr. Greer Pickerel - CT scan shows no deep fluid collection  - Wound was opened down to the fascia 10/28.  Fascia intact, but diffuse purulence. - Cultures polymicrobial.  Continue current antibiotics pending  sensitivities. - WBC 5.5    CT evidence of early, partial small bowel obstruction - Clinically she does not appear obstructed. - Bowel regimen: Senokot BID, MiraLAX daily  Acute respiratory failure - mild pulmonary effusion on CT - hypoxemia, tachypnea at presentation to ED - mild elevation in BNP   Anemia  HTN - clonidine 0.1 TID, metoprolol 50 BID Chronic pain GERD OSA - no on bi-pap  FEN: heart healthy, IVF ID: aztreonam, flagyl VTE: SCD's, Lovenox per pharmacy  Plan: continue BID wet-to-dry dressing changes today.  Wound VAC being placed tomorrow by [place], Melody - appreciate [place] assistance.   Following cultures to direct abx treatment Medicine requesting for [place] to take over as primary - I think this is reasonable, will confirm with MD    LOS: 1 day    Jill Alexanders , [facility] Surgery 07/23/2016, 7:49 AM Pager: [phone] Consults: [phone] Mon-Fri 7:00 am-4:30 pm Sat-Sun 7:00 am-11:30 am

## 2016-07-24 LAB — AEROBIC CULTURE W GRAM STAIN (SUPERFICIAL SPECIMEN)

## 2016-07-24 MED ORDER — SODIUM CHLORIDE 0.9% FLUSH
10.0000 mL | INTRAVENOUS | Status: DC | PRN
  Administered 2016-07-25 – 2016-08-01 (×4): 10 mL
  Filled 2016-07-24 (×4): qty 40

## 2016-07-24 MED ORDER — DEXTROSE 5 % IV SOLN
2.0000 g | INTRAVENOUS | Status: DC
  Administered 2016-07-24 – 2016-07-30 (×7): 2 g via INTRAVENOUS
  Filled 2016-07-24 (×8): qty 2

## 2016-07-24 NOTE — Progress Notes (Signed)
[facility] Progress Note     Subjective: Melody, WOC RN called me today with concern regarding drainage from superior aspect of wound.  Patient states pain is controlled, rated 5/10. Tolerating PO. + BM yesterday. Reports ambulating in room. Denies SOB.   Objective: Vital signs in last 24 hours: Temp:  [97.9 F (36.6 C)-99.1 F (37.3 C)] 98.8 F (37.1 C) (10/31 0958) Pulse Rate:  [63-70] 70 (10/31 0900) Resp:  [16-18] 18 (10/31 0900) BP: (123-145)/(57-73) 145/68 (10/31 0900) SpO2:  [98 %-100 %] 100 % (10/31 0900) Last BM Date: 07/15/16  Intake/Output from previous day: 10/30 0701 - 10/31 0700 In: 303 [P.O.:300; I.V.:3] Out: 0  Intake/Output this shift: Total I/O In: 0  Out: 325 [Urine:325]  PE: Gen:  Alert, NAD, pleasant Pulm:  CTA, no W/R/R Abd: Soft, appropriately tender, ND, +BS Laparotomy incision with bright yellow drainage at superior aspect of wound - concerning for ECF. Edges of wound granulating well, wound base with some slough and drainage.  Lab Results:   Recent Labs  07/22/16 0550 07/23/16 0602  WBC 5.7 5.5  HGB 9.2* 9.4*  HCT 29.2* 29.6*  PLT 522* 532*   BMET  Recent Labs  07/22/16 0550 07/23/16 0602  NA 139 139  K 3.3* 3.5  CL 103 104  CO2 29 29  GLUCOSE 93 93  BUN 5* 9  CREATININE 0.73 0.86  CALCIUM 8.4* 8.3*   CMP     Component Value Date/Time   NA 139 07/23/2016 0602   NA 140 02/14/2016   K 3.5 07/23/2016 0602   CL 104 07/23/2016 0602   CO2 29 07/23/2016 0602   GLUCOSE 93 07/23/2016 0602   BUN 9 07/23/2016 0602   BUN 13 02/14/2016   CREATININE 0.86 07/23/2016 0602   CALCIUM 8.3 (L) 07/23/2016 0602   PROT 5.9 (L) 07/23/2016 0602   ALBUMIN 2.3 (L) 07/23/2016 0602   AST 11 (L) 07/23/2016 0602   ALT 15 07/23/2016 0602   ALKPHOS 85 07/23/2016 0602   BILITOT 0.4 07/23/2016 0602   GFRNONAA >60 07/23/2016 0602   GFRAA >60 07/23/2016 0602   Lipase     Component Value Date/Time   LIPASE 61 (H) 07/04/2016 0318    Anti-infectives: Anti-infectives    Start     Dose/Rate Route Frequency Ordered Stop   07/22/16 1400  meropenem (MERREM) 1 g in sodium chloride 0.9 % 100 mL IVPB     1 g 200 mL/hr over 30 Minutes Intravenous Every 8 hours 07/22/16 1338     07/21/16 1300  vancomycin (VANCOCIN) 1,250 mg in sodium chloride 0.9 % 250 mL IVPB     1,250 mg 166.7 mL/hr over 90 Minutes Intravenous Every 12 hours 07/21/16 1028     07/21/16 1030  aztreonam (AZACTAM) 1 g in dextrose 5 % 50 mL IVPB  Status:  Discontinued     1 g 100 mL/hr over 30 Minutes Intravenous Every 8 hours 07/21/16 1028 07/22/16 1337   07/21/16 1000  vancomycin (VANCOCIN) 1,250 mg in sodium chloride 0.9 % 250 mL IVPB  Status:  Discontinued     1,250 mg 166.7 mL/hr over 90 Minutes Intravenous Every 12 hours 07/21/16 0613 07/21/16 1028   07/21/16 1000  aztreonam (AZACTAM) 1 g in dextrose 5 % 50 mL IVPB  Status:  Discontinued     1 g 100 mL/hr over 30 Minutes Intravenous Every 8 hours 07/21/16 0613 07/21/16 1028   07/21/16 0800  metroNIDAZOLE (FLAGYL) IVPB 500 mg  Status:  Discontinued     500 mg 100 mL/hr over 60 Minutes Intravenous Every 8 hours 07/21/16 0613 07/22/16 1337   07/20/16 2345  aztreonam (AZACTAM) 2 g in dextrose 5 % 50 mL IVPB     2 g 100 mL/hr over 30 Minutes Intravenous  Once 07/20/16 2343 07/21/16 0108   07/20/16 2345  metroNIDAZOLE (FLAGYL) IVPB 500 mg     500 mg 100 mL/hr over 60 Minutes Intravenous  Once 07/20/16 2343 07/21/16 0144   07/20/16 2345  vancomycin (VANCOCIN) IVPB 1000 mg/200 mL premix     1,000 mg 200 mL/hr over 60 Minutes Intravenous  Once 07/20/16 2343 07/21/16 0456     Assessment/Plan Abdominal wound infection S/p diagnostic laparoscopy emergent coversion to open with LOA x 3 hours on 10/13, Dr. Greer Pickerel - CT scan shows no deep fluid collection  - Wound was opened down to the fascia 10/28. Fascia intact, but diffuse purulence. - Cultures: E.coli sensitive to ceftriaxone. Will narrow  coverage. - WBC 5.5    CT evidence of early, partial small bowel obstruction - Clinically she does not appear obstructed. - Bowel regimen: Senokot BID, MiraLAX daily  Acute respiratory failure - mild pulmonary effusion on CT - hypoxemia, tachypnea at presentation to ED, resolved   Anemia  HTN - clonidine 0.1 TID, metoprolol 50 BID Chronic pain - tramadol   GERD OSA - no on bi-pap  FEN: heart healthy, IVF ID: aztreonam, flagyl VTE: SCD's, Lovenox per pharmacy  Plan: continue BID wet-to-dry dressing changes, appreciate WOC seeing for recommendations regarding drainage/timing of possible pouch if drainage increases. PICC placement and TPN Narrow abx coverage NPO except ice chips    LOS: 2 days    Jill Alexanders , [facility] Surgery 07/24/2016, 10:04 AM Pager: [phone] Consults: [phone] Mon-Fri 7:00 am-4:30 pm Sat-Sun 7:00 am-11:30 am

## 2016-07-24 NOTE — Consult Note (Signed)
[place] Nurse wound consult note Reason for Consult: placement of NPWT dressing to the midline wound Wound type: surgical  Measurement: 17cm x 7cm x 5.5cm  Wound bed:80% subcutanous, pink, moist, 20% slough with exposure of 3 sutures.  Slough is loose Drainage (amount, consistency, odor)  Upon assessment today WOC is concerned about the drainage noted on the dressing I removed.  The drainage appears feculent but no odor.  Drainage is dark yellow/brown.  WOC appreciates deeper pocket below the slough but did not probe to deepest point.  Concerned patient may be developing a fistula. Notified CCS Dr. Georgette Dover and PA of my concern. Periwound:intact  Dressing procedure/placement/frequency: After assessment of surgical team, decision to maintain current dressings with moist gauze dressings for now. Discussed [place] pouching for fistula drainage if drainage becomes an issue.  WOC will monitor patient for increase effluent and pouch when appropriate.  CCS has explained change in wound status and possibility of fistula to the patient.   WOC will follow along closely for management of wound   Melody Liane Comber RN,CWOCN [id number]

## 2016-07-24 NOTE — Progress Notes (Signed)
Peripherally Inserted Central Catheter/Midline Placement  The IV Nurse has discussed with the patient and/or persons authorized to consent for the patient, the purpose of this procedure and the potential benefits and risks involved with this procedure.  The benefits include less needle sticks, lab draws from the catheter, and the patient may be discharged home with the catheter. Risks include, but not limited to, infection, bleeding, blood clot (thrombus formation), and puncture of an artery; nerve damage and irregular heartbeat and possibility to perform a PICC exchange if needed/ordered by physician.  Alternatives to this procedure were also discussed.  Bard Power PICC patient education guide, fact sheet on infection prevention and patient information card has been provided to patient /or left at bedside.    PICC/Midline Placement Documentation        Amy [name] 07/24/2016, 2:39 PM Consent obtained by Jule Economy, RN

## 2016-07-24 NOTE — Progress Notes (Signed)
Physical Therapy Treatment Patient Details Name: Amy [name] MRN: [id number] DOB: 1946/02/10 Today's Date: 07/24/2016    History of Present Illness Patient is a 70 yo female admitted 07/20/16 with acute respiratory failure with hypoxia, dehissance of abdominal wound with infection, metabolic encephalopathy.    PMHx:  SBO with lysis of adhesions, DOE, PVD, neuropathy, HTN, h/o DVT, fibromyalgia, and chronic back pain (s/p multiple back surgeries).     PT Comments    Needed lots of encouragement and only agreed to get to the chair, but mobilized well overall with minimal assist.   Follow Up Recommendations  SNF;Supervision/Assistance - 24 hour     Equipment Recommendations  None recommended by PT    Recommendations for Other Services       Precautions / Restrictions Precautions Precautions: Fall Precaution Comments: Open wound abdomen Required Braces or Orthoses: Other Brace/Splint Restrictions Weight Bearing Restrictions: No    Mobility  Bed Mobility Overal bed mobility: Needs Assistance Bed Mobility: Supine to Sit     Supine to sit: Min assist     General bed mobility comments: up via R elbow with assist  Transfers Overall transfer level: Needs assistance   Transfers: Sit to/from Stand Sit to Stand: Min guard Stand pivot transfers: Min assist       General transfer comment: no cues for safety needed.  Ambulation/Gait             General Gait Details: deferred amb due to tired and not feeling well.   Stairs            Wheelchair Mobility    Modified Rankin (Stroke Patients Only)       Balance     Sitting balance-Leahy Scale: Good         Standing balance comment: need hand on something stationary.                    Cognition Arousal/Alertness: Awake/alert Behavior During Therapy: WFL for tasks assessed/performed Overall Cognitive Status: Within Functional Limits for tasks assessed                      Exercises      General Comments        Pertinent Vitals/Pain Pain Assessment: Faces Faces Pain Scale: Hurts little more Pain Location: abdomen with mobility Pain Descriptors / Indicators: Grimacing;Sore Pain Intervention(s): Repositioned    Home Living                      Prior Function            PT Goals (current goals can now be found in the care plan section) Acute Rehab PT Goals Patient Stated Goal: pt wants to decrease pain PT Goal Formulation: With patient Time For Goal Achievement: 07/29/16 Potential to Achieve Goals: Good Progress towards PT goals: Progressing toward goals    Frequency    Min 3X/week      PT Plan Current plan remains appropriate    Co-evaluation             End of Session   Activity Tolerance: Patient tolerated treatment well Patient left: in chair;with call bell/phone within reach;with family/visitor present     Time: [id number] PT Time Calculation (min) (ACUTE ONLY): 28 min  Charges:  $Therapeutic Activity: 23-37 mins                    G Codes:  [place], Tessie Fass 07/24/2016, 5:35 PM 07/24/2016  Donnella Sham, PT [phone]  (pager)

## 2016-07-24 NOTE — Consult Note (Signed)
   [facility] CM Inpatient Consult   07/24/2016  JANNIE LARRICK 1945-12-03 [id number]  Patient was screened for re-admission. Patient is eligible for community care management services. However, the Patient is from a skilled facility with plans to return.  Chart review reveals the patient is a 70 y.o. female with medical history significant for recent small bowel obstruction s/p surgical repair 2 weeks ago, HTN chronic pain (knee, back, shoulder,neck) obesity, admitted with a chief complaint of increased drainage from incision for 4 days and generalized weakness. Initial evaluation revealed a foul-smelling draining wound tachypnea and hypoxia.  Also chart review that the patient will discharge back to skilled for ongoing wound care.  No community care management needs noted.  For questions, please contact:  Natividad Brood, RN BSN [facility] Liaison  [phone] business mobile phone Toll free office [phone]

## 2016-07-24 NOTE — Progress Notes (Signed)
Initial Nutrition Assessment  DOCUMENTATION CODES:   Morbid obesity  INTERVENTION:  Diet advancement vs.enteral nutrition.   As pt clinically does not appear obstructed and output has been low, recommend oral diet. If oral diet is associated with a significant increase in output or not tolerated, enteral nutrition may be feasible and tolerated when access can be obtained distal to the fistula.   If fistula output >500 ml/day and/or PO intake or enteral nutrition is not tolerated, recommend TPN. (Initial TPN delayed until 11/2 if in need)  RD to continue to monitor.   NUTRITION DIAGNOSIS:   Inadequate oral intake related to inability to eat as evidenced by NPO status.  GOAL:   Patient will meet greater than or equal to 90% of their needs  MONITOR:   Labs, Weight trends, Skin, Diet advancement, I & O's  REASON FOR ASSESSMENT:   Consult Wound healing  ASSESSMENT:   70 y.o. female with medical history significant for recent sbo s/p surgical repair 2 weeks ago, htn, chronic pain (knee, back, shoulder,neck) obesity, anxiety [facility] emergency department chief complaint of increased drainage from incision for 4 days and generalized weakness. 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 Pt with relatively low output. Initial plans for TPN, however TPN now delayed until 07/26/16 if needed as pt has been tolerating PO with low fistula output and pt not malnourished. Spoke with pharmacy regarding current plan.   Per [facility] clinical guidelines, oral diet or enteral nutrition may be feasible and tolerated in pts with low fistula output (<500 ml/day). Pts with high output fistula (>500 ml/day) may require TPN to meet fluid, electrolyte, and nutrient requirements.   As pt clinically does not appear obstructed and output has been low, recommend oral diet. If oral diet is associated  with a significant increase in output or not tolerated, enteral nutrition may be feasible and tolerated when access can be obtained distal to the fistula.   Meal completion since admission has been 50-100%. Pt reports eating well PTA with usual intake of at least 3 meals a day. Pt is currently NPO. If/when diet advanced, RD to order nutritional supplements to aid in wound healing. Pt with no significant weight loss. Pt with no observed significant fat or muscle mass loss.   Labs and medications reviewed.   Diet Order:  Diet NPO time specified  Skin:  Wound (see comment) (Stage 2 on buttocks, open abdomen wound w/ EC fistula)  Last BM:  10/31  Height:   Ht Readings from Last 1 Encounters:  07/21/16 5' 5.5" (1.664 m)    Weight:   Wt Readings from Last 1 Encounters:  07/22/16 280 lb 9.6 oz (127.3 kg)    Ideal Body Weight:  57.9 kg  BMI:  Body mass index is 45.98 kg/m.  Estimated Nutritional Needs:   Kcal:  2000-2200  Protein:  125-145 grams  Fluid:  2-2.2 L/day  EDUCATION NEEDS:   No education needs identified at this time  Corrin Parker, MS, RD, LDN Pager # [phone] After hours/ weekend pager # [phone]

## 2016-07-24 NOTE — Progress Notes (Addendum)
PHARMACY - ADULT TOTAL PARENTERAL NUTRITION CONSULT NOTE   Pharmacy Consult:  TPN Indication:  EC fistula  Patient Measurements: Height: 5' 5.5" (166.4 cm) Weight: 280 lb 9.6 oz (127.3 kg) IBW/kg (Calculated) : 58.15 TPN AdjBW (KG): 75.1 Body mass index is 45.98 kg/m.   Assessment:  53 YOF with a recent history of SBO post LoA on 07/07/16 and was discharged to Rehab.  Patient presented on 07/20/16 PM with abdominal pain and drainage from surgical wound.  CT showed early pSBO but clinically patient does not appear obstructed per MD.  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  Patient reports eating 50-100% of her meals PTA.  Sometimes she doesn't feel hungry and doesn't want to eat, and that is when she eats less.  She denies any weight loss or issues preventing her from eating.  Spoke to RD who agrees patient is not malnourished and that TPN initiation should be delayed.  GI: hx GERD - Senokot-S, Miralax (Protonix PTA) Endo: no hx DM - AM glucose low normal Insulin requirements in the past 24 hours: none Lytes: all WNL Renal: SCr 0.86, BUN WNL - Flomax Pulm: OSA - on 1L [place] Cards: HTN / PVD - VSS - clonidine, Lopressor  Hepatobil: LFTs / tbili WNL Neuro: chronic pain / anxiety / depression / fibromyalgia / neuropathy - Paxil, Voltaren ID: Vanc/Merrem for intra-abd infxn/cellulitis - afebrile, WBC WNL Best Practices: Lovenox TPN Access: PICC to be placed 07/24/16 TPN start date: 07/24/16  Nutritional Goals: 1900-2100 kCal and 100-115 gm protein per day  Current Nutrition:  Regular diet (eating 0-100% of meals) >> NPO   Plan:  - Spoke to Surgery PA, will delay starting TPN until 07/26/16. - Check TPN labs on 07/26/16   Thuy D. Mina Marble, PharmD, BCPS Pager:  [phone] 07/24/2016, 1:30 PM

## 2016-07-25 LAB — BASIC METABOLIC PANEL
Anion gap: 4 — ABNORMAL LOW (ref 5–15)
BUN: <5 mg/dL — ABNORMAL LOW (ref 6–20)
CO2: 32 mmol/L (ref 22–32)
Calcium: 8.2 mg/dL — ABNORMAL LOW (ref 8.9–10.3)
Chloride: 104 mmol/L (ref 101–111)
Creatinine, Ser: 0.6 mg/dL (ref 0.44–1.00)
GFR calc Af Amer: >60 mL/min (ref >60)
GFR calc non Af Amer: >60 mL/min (ref >60)
Glucose, Bld: 85 mg/dL (ref 65–99)
Potassium: 3.4 mmol/L — ABNORMAL LOW (ref 3.5–5.1)
Sodium: 140 mmol/L (ref 135–145)

## 2016-07-25 LAB — GLUCOSE, CAPILLARY
Glucose-Capillary: 90 mg/dL (ref 65–99)
Glucose-Capillary: 85 mg/dL (ref 65–99)
Glucose-Capillary: 86 mg/dL (ref 65–99)
Glucose-Capillary: 85 mg/dL (ref 65–99)
Glucose-Capillary: 100 mg/dL — ABNORMAL HIGH (ref 65–99)

## 2016-07-25 LAB — PHOSPHORUS: Phosphorus: 3 mg/dL (ref 2.5–4.6)

## 2016-07-25 LAB — MAGNESIUM: Magnesium: 1.8 mg/dL (ref 1.7–2.4)

## 2016-07-25 MED ORDER — FAT EMULSION 20 % IV EMUL
240.0000 mL | INTRAVENOUS | Status: AC
  Administered 2016-07-25: 240 mL via INTRAVENOUS
  Filled 2016-07-25: qty 250

## 2016-07-25 MED ORDER — TRACE MINERALS CR-CU-MN-SE-ZN 10-1000-500-60 MCG/ML IV SOLN
INTRAVENOUS | Status: AC
  Administered 2016-07-25: 18:00:00 via INTRAVENOUS
  Filled 2016-07-25: qty 960

## 2016-07-25 MED ORDER — SODIUM CHLORIDE 0.9 % IV BOLUS (SEPSIS)
500.0000 mL | Freq: Once | INTRAVENOUS | Status: AC
  Administered 2016-07-25: 500 mL via INTRAVENOUS

## 2016-07-25 MED ORDER — POTASSIUM CHLORIDE 10 MEQ/50ML IV SOLN
10.0000 meq | INTRAVENOUS | Status: AC
  Administered 2016-07-25 (×4): 10 meq via INTRAVENOUS
  Filled 2016-07-25 (×4): qty 50

## 2016-07-25 MED ORDER — SODIUM CHLORIDE 0.9 % IV SOLN
INTRAVENOUS | Status: AC
  Administered 2016-07-25: 21:00:00 via INTRAVENOUS

## 2016-07-25 MED ORDER — SODIUM CHLORIDE 0.9 % IV SOLN: INTRAVENOUS | Status: DC

## 2016-07-25 MED ORDER — HYDROMORPHONE HCL 1 MG/ML IJ SOLN
0.2000 mg | INTRAMUSCULAR | Status: DC | PRN
Start: 2016-07-25 — End: 2016-07-30
  Administered 2016-07-25: 0.4 mg via INTRAVENOUS
  Filled 2016-07-25: qty 1

## 2016-07-25 MED ORDER — MAGNESIUM SULFATE IN D5W 1-5 GM/100ML-% IV SOLN
1.0000 g | Freq: Once | INTRAVENOUS | Status: AC
  Administered 2016-07-25: 1 g via INTRAVENOUS
  Filled 2016-07-25: qty 100

## 2016-07-25 NOTE — Consult Note (Signed)
WOC wound check today Patient reports that dressings are being change just twice a day. She reports that the drainage has not been coming through the dressings between changes.  Will continue to monitor for need for fistula pouch.  Will be easier to manage with dressings in rehab facility or at home if possible.    Melody [facility] MSN, [place], [place], [place]

## 2016-07-25 NOTE — Progress Notes (Signed)
Nutrition Follow-up  DOCUMENTATION CODES:   Morbid obesity  INTERVENTION:  TPN per Pharmacy.  RD to continue to monitor.  NUTRITION DIAGNOSIS:   Inadequate oral intake related to inability to eat as evidenced by NPO status; ongoing  GOAL:   Patient will meet greater than or equal to 90% of their needs; not met  MONITOR:   Labs, Weight trends, Diet advancement, Skin, I & O's, Other (Comment) (TPN tolerance)  REASON FOR ASSESSMENT:   Consult Wound healing  ASSESSMENT:   70 y.o. female with medical history significant for recent sbo s/p surgical repair 2 weeks ago, htn, chronic pain (knee, back, shoulder,neck) obesity, anxiety [facility] emergency department chief complaint of increased drainage from incision for 4 days and generalized weakness. 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 Per wound care RN, drainage has not been coming through the dressing between changes.   Plans for TPN to start today. Per Pharmacy note, start Clinimix E 4.25/10 at 40 ml/hr plus lipids at 10 ml/hr and assess tolerance. Goal rate of Clinimix E 01/16/09 is 125 ml/hr with lipids at 10 ml/hr  to provide 127.5 gm protein and 2020 Kcals to meet 100% goals.   RD to continue to monitor.   Labs and medications reviewed.   Diet Order:  TPN (CLINIMIX-E) Adult Diet NPO time specified  Skin:  Wound (see comment) (Stage 2 on buttocks, open abdomen wound w/ EC fistula)  Last BM:  10/31  Height:   Ht Readings from Last 1 Encounters:  07/24/16 _0  (1.676 m)    Weight:   Wt Readings from Last 1 Encounters:  07/24/16 282 lb (127.9 kg)    Ideal Body Weight:  57.9 kg  BMI:  Body mass index is 45.52 kg/m.  Estimated Nutritional Needs:   Kcal:  2000-2200  Protein:  125-145 grams  Fluid:  2-2.2 L/day  EDUCATION NEEDS:   No education needs identified at this time  Corrin Parker, MS, RD, LDN Pager # [phone] After hours/ weekend pager # [phone]

## 2016-07-25 NOTE — Progress Notes (Signed)
PHARMACY - ADULT TOTAL PARENTERAL NUTRITION CONSULT NOTE   Pharmacy Consult:  TPN Indication:  EC fistula  Patient Measurements: Height: 5\' 6"  (167.6 cm) Weight: 282 lb (127.9 kg) IBW/kg (Calculated) : 59.3 TPN AdjBW (KG): 76.5 Body mass index is 45.52 kg/m.   Assessment:  14 YOF with a recent history of SBO post LoA on 07/07/16 and was discharged to Rehab.  Patient presented on 07/20/16 PM with abdominal pain and drainage from surgical wound.  CT showed early pSBO but clinically patient does not appear obstructed per MD.  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  Patient reports eating 50-100% of her meals PTA.  Sometimes she doesn't feel hungry and doesn't want to eat, and that is when she eats less.  She denies any weight loss or issues preventing her from eating.  GI: hx GERD - Senokot-S, Miralax (Protonix PTA) [facility] request to start TPN 11/1 Endo: no hx DM - AM glucose low normal Insulin requirements in the past 24 hours: none Lytes: K 3.4, phos 3, mag 1.8, CoCa = 9.56 Renal: SCr 0.86, BUN WNL - Flomax Pulm: OSA - on 1L [place] Cards: HTN / PVD - VSS - clonidine, Lopressor  Hepatobil: LFTs / tbili WNL Neuro: chronic pain / anxiety / depression / fibromyalgia / neuropathy - Paxil, Voltaren ID: Vanc/Merrem >>CTX on 10/31  for intra-abd infxn/cellulitis - afebrile, WBC WNL Best Practices: Lovenox TPN Access: PICC  placed 07/24/16 TPN start date: 07/24/16  Nutritional Goals: per RD 10/31 2000-2200 kCal and 125-145 gm protein per day  Current Nutrition:  Regular diet (eating 0-100% of meals) >> NPO  Plan: start Clinimix E 4.25/10 at 40 ml/hr plus lipids at 10 ml/hr and assess tolerance Goal rate of Clinimix E 01/16/09 is 125 ml/hr with lipids at 10 ml/hr  to provide 127.5 gm protein and 2020 Kcals to meet 100% goals.  Unsure if she can tolerate 3 liters/day. Estimated fluid needs 2 - 2.2 liters/day MD ordered NS 500 ml bolus followed by NS at 100 ml/hr, will  decrease to 50 ml/hr once TPN started tonight at 1800. - 1 gm mag followed by 4 runs of K - add MVI/trace elements to TPN - TPN labs   Eudelia Bunch, Pharm.D. [id number] 07/25/2016 9:28 AM

## 2016-07-25 NOTE — Progress Notes (Signed)
Subjective: Patient without complaints except thirst Pain is controlled  Objective: Vital signs in last 24 hours: Temp:  [98.8 F (37.1 C)-99.1 F (37.3 C)] 98.8 F (37.1 C) (11/01 0519) Pulse Rate:  [70-72] 71 (11/01 0519) Resp:  [17-18] 17 (11/01 0519) BP: (145-166)/(68-78) 146/77 (11/01 0519) SpO2:  [98 %-100 %] 100 % (11/01 0519) Weight:  [127.9 kg (282 lb)] 127.9 kg (282 lb) (10/31 2120) Last BM Date: 07/24/16  Intake/Output from previous day: 10/31 0701 - 11/01 0700 In: 170 [P.O.:120; IV Piggyback:50] Out: 325 [Urine:325] Intake/Output this shift: No intake/output data recorded.  General appearance: alert, cooperative and no distress Resp: clear to auscultation bilaterally Cardio: regular rate and rhythm, S1, S2 normal, no murmur, click, rub or gallop Abd - soft, incisional tenderness; + BS PICC line in place Right arm Incision - sides well-granulated; base of wound with some fibrinous tissue; some thin yellowish/ green drainage Lab Results:   Recent Labs  07/23/16 0602  WBC 5.5  HGB 9.4*  HCT 29.6*  PLT 532*   BMET  Recent Labs  07/23/16 0602  NA 139  K 3.5  CL 104  CO2 29  GLUCOSE 93  BUN 9  CREATININE 0.86  CALCIUM 8.3*   PT/INR No results for input(s): LABPROT, INR in the last 72 hours. ABG No results for input(s): PHART, HCO3 in the last 72 hours.  Invalid input(s): PCO2, PO2  Studies/Results: No results found.  Anti-infectives: Anti-infectives    Start     Dose/Rate Route Frequency Ordered Stop   07/24/16 1400  cefTRIAXone (ROCEPHIN) 2 g in dextrose 5 % 50 mL IVPB     2 g 100 mL/hr over 30 Minutes Intravenous Every 24 hours 07/24/16 1219     07/22/16 1400  meropenem (MERREM) 1 g in sodium chloride 0.9 % 100 mL IVPB  Status:  Discontinued     1 g 200 mL/hr over 30 Minutes Intravenous Every 8 hours 07/22/16 1338 07/24/16 1218   07/21/16 1300  vancomycin (VANCOCIN) 1,250 mg in sodium chloride 0.9 % 250 mL IVPB  Status:   Discontinued     1,250 mg 166.7 mL/hr over 90 Minutes Intravenous Every 12 hours 07/21/16 1028 07/24/16 1218   07/21/16 1030  aztreonam (AZACTAM) 1 g in dextrose 5 % 50 mL IVPB  Status:  Discontinued     1 g 100 mL/hr over 30 Minutes Intravenous Every 8 hours 07/21/16 1028 07/22/16 1337   07/21/16 1000  vancomycin (VANCOCIN) 1,250 mg in sodium chloride 0.9 % 250 mL IVPB  Status:  Discontinued     1,250 mg 166.7 mL/hr over 90 Minutes Intravenous Every 12 hours 07/21/16 0613 07/21/16 1028   07/21/16 1000  aztreonam (AZACTAM) 1 g in dextrose 5 % 50 mL IVPB  Status:  Discontinued     1 g 100 mL/hr over 30 Minutes Intravenous Every 8 hours 07/21/16 0613 07/21/16 1028   07/21/16 0800  metroNIDAZOLE (FLAGYL) IVPB 500 mg  Status:  Discontinued     500 mg 100 mL/hr over 60 Minutes Intravenous Every 8 hours 07/21/16 0613 07/22/16 1337   07/20/16 2345  aztreonam (AZACTAM) 2 g in dextrose 5 % 50 mL IVPB     2 g 100 mL/hr over 30 Minutes Intravenous  Once 07/20/16 2343 07/21/16 0108   07/20/16 2345  metroNIDAZOLE (FLAGYL) IVPB 500 mg     500 mg 100 mL/hr over 60 Minutes Intravenous  Once 07/20/16 2343 07/21/16 0144   07/20/16 2345  vancomycin (VANCOCIN) IVPB 1000  mg/200 mL premix     1,000 mg 200 mL/hr over 60 Minutes Intravenous  Once 07/20/16 2343 07/21/16 0456      Assessment/Plan: Abdominal wound infection/ 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 Greer Pickerel - CT scan shows no deep fluid collection  - Wound was opened down to the fascia 10/28. Fascia partially dehisced - Cultures: E.coli sensitive to ceftriaxone. Will narrow coverage. - WBC 5.5   CT evidence of early, partial small bowel obstruction - Clinically she does not appear obstructed. - Bowel regimen: Senokot BID,MiraLAX daily  Acute respiratory failure - mild pulmonary effusion on CT - hypoxemia, tachypnea at presentation to ED, resolved    Anemia  HTN - clonidine 0.1 TID, metoprolol 50 BID Chronic pain - tramadol   GERD OSA - no on bi-pap  FEN: heart healthy, IVF ID: aztreonam, flagyl VTE: SCD's, Lovenox per pharmacy  Plan: continue BID wet-to-dry dressing changes, appreciate WOC seeing for recommendations regarding drainage/timing of possible pouch if drainage increases. PICC placement and TPN Narrow abx coverage NPO except ice chips and sips with meds  LOS: 3 days    TSUEI,MATTHEW K. 07/25/2016

## 2016-07-26 LAB — CBC
HCT: 29.1 % — ABNORMAL LOW (ref 36.0–46.0)
Hemoglobin: 9.4 g/dL — ABNORMAL LOW (ref 12.0–15.0)
MCH: 29.2 pg (ref 26.0–34.0)
MCHC: 32.3 g/dL (ref 30.0–36.0)
MCV: 90.4 fL (ref 78.0–100.0)
Platelets: 464 10*3/uL — ABNORMAL HIGH (ref 150–400)
RBC: 3.22 MIL/uL — ABNORMAL LOW (ref 3.87–5.11)
RDW: 14.8 % (ref 11.5–15.5)
WBC: 5 10*3/uL (ref 4.0–10.5)

## 2016-07-26 LAB — COMPREHENSIVE METABOLIC PANEL
ALT: 17 U/L (ref 14–54)
AST: 23 U/L (ref 15–41)
Albumin: 2.2 g/dL — ABNORMAL LOW (ref 3.5–5.0)
Alkaline Phosphatase: 83 U/L (ref 38–126)
Anion gap: 6 (ref 5–15)
BUN: <5 mg/dL — ABNORMAL LOW (ref 6–20)
CO2: 32 mmol/L (ref 22–32)
Calcium: 8.3 mg/dL — ABNORMAL LOW (ref 8.9–10.3)
Chloride: 101 mmol/L (ref 101–111)
Creatinine, Ser: 0.63 mg/dL (ref 0.44–1.00)
GFR calc Af Amer: >60 mL/min (ref >60)
GFR calc non Af Amer: >60 mL/min (ref >60)
Glucose, Bld: 107 mg/dL — ABNORMAL HIGH (ref 65–99)
Potassium: 3.1 mmol/L — ABNORMAL LOW (ref 3.5–5.1)
Sodium: 139 mmol/L (ref 135–145)
Total Bilirubin: 0.3 mg/dL (ref 0.3–1.2)
Total Protein: 5.7 g/dL — ABNORMAL LOW (ref 6.5–8.1)

## 2016-07-26 LAB — CULTURE, BLOOD (ROUTINE X 2)
Culture: NO GROWTH
Culture: NO GROWTH

## 2016-07-26 LAB — GLUCOSE, CAPILLARY
Glucose-Capillary: 113 mg/dL — ABNORMAL HIGH (ref 65–99)
Glucose-Capillary: 117 mg/dL — ABNORMAL HIGH (ref 65–99)
Glucose-Capillary: 118 mg/dL — ABNORMAL HIGH (ref 65–99)
Glucose-Capillary: 123 mg/dL — ABNORMAL HIGH (ref 65–99)
Glucose-Capillary: 129 mg/dL — ABNORMAL HIGH (ref 65–99)
Glucose-Capillary: 90 mg/dL (ref 65–99)

## 2016-07-26 LAB — PHOSPHORUS: Phosphorus: 2.9 mg/dL (ref 2.5–4.6)

## 2016-07-26 LAB — MAGNESIUM: Magnesium: 1.9 mg/dL (ref 1.7–2.4)

## 2016-07-26 LAB — PREALBUMIN: Prealbumin: 12 mg/dL — ABNORMAL LOW (ref 18–38)

## 2016-07-26 MED ORDER — POTASSIUM CHLORIDE 10 MEQ/50ML IV SOLN
10.0000 meq | INTRAVENOUS | Status: AC
  Administered 2016-07-26 (×6): 10 meq via INTRAVENOUS
  Filled 2016-07-26 (×7): qty 50

## 2016-07-26 MED ORDER — MAGNESIUM SULFATE 2 GM/50ML IV SOLN
2.0000 g | Freq: Once | INTRAVENOUS | Status: AC
  Administered 2016-07-26: 2 g via INTRAVENOUS
  Filled 2016-07-26: qty 50

## 2016-07-26 MED ORDER — CLINIMIX E/DEXTROSE (4.25/10) 4.25 % IV SOLN
INTRAVENOUS | Status: AC
  Administered 2016-07-26: 18:00:00 via INTRAVENOUS
  Filled 2016-07-26: qty 1920

## 2016-07-26 MED ORDER — FAT EMULSION 20 % IV EMUL
240.0000 mL | INTRAVENOUS | Status: AC
  Administered 2016-07-26: 240 mL via INTRAVENOUS
  Filled 2016-07-26: qty 250

## 2016-07-26 NOTE — Progress Notes (Signed)
Physical Therapy Treatment Patient Details Name: Amy [name] MRN: [id number] DOB: [birth year] Today's Date: 07/26/2016    History of Present Illness Patient is a 70 yo female admitted 07/20/16 with acute respiratory failure with hypoxia, dehissance of abdominal wound with infection, metabolic encephalopathy.    PMHx:  SBO with lysis of adhesions, DOE, PVD, neuropathy, HTN, h/o DVT, fibromyalgia, and chronic back pain (s/p multiple back surgeries).     PT Comments    Progressing slowly with mobility, limited by discomfort and only getting TNA for nutrition.  Follow Up Recommendations  SNF;Supervision/Assistance - 24 hour     Equipment Recommendations  None recommended by PT    Recommendations for Other Services       Precautions / Restrictions Precautions Precautions: Fall Precaution Comments: Open wound abdomen Required Braces or Orthoses: Other Brace/Splint Restrictions Weight Bearing Restrictions: No    Mobility  Bed Mobility               General bed mobility comments: up OOB in chair on arrival  Transfers   Equipment used: Rolling walker (2 wheeled) Transfers: Sit to/from Stand Sit to Stand: Min guard         General transfer comment: no cues for safety needed.  Ambulation/Gait Ambulation/Gait assistance: Min guard Ambulation Distance (Feet): 70 Feet Assistive device: Rolling walker (2 wheeled) Gait Pattern/deviations: Step-through pattern Gait velocity: slow Gait velocity interpretation: Below normal speed for age/gender General Gait Details: slow short steps, generally steady, but tentative   Stairs            Wheelchair Mobility    Modified Rankin (Stroke Patients Only)       Balance     Sitting balance-Leahy Scale: Good       Standing balance-Leahy Scale: Poor                      Cognition Arousal/Alertness: Awake/alert Behavior During Therapy: WFL for tasks assessed/performed Overall Cognitive  Status: Within Functional Limits for tasks assessed                      Exercises General Exercises - Lower Extremity Long Arc Quad: AROM;Both;10 reps;Seated Hip Flexion/Marching: AROM;Strengthening;Both;10 reps;Seated (resisted) Other Exercises Other Exercises: bil bicep/tricep presses x10 reps resisted.    General Comments        Pertinent Vitals/Pain Pain Assessment: Faces Faces Pain Scale: Hurts little more Pain Descriptors / Indicators: Sore Pain Intervention(s): Monitored during session    Home Living                      Prior Function            PT Goals (current goals can now be found in the care plan section) Acute Rehab PT Goals Patient Stated Goal: pt wants to decrease pain PT Goal Formulation: With patient Time For Goal Achievement: 07/29/16 Potential to Achieve Goals: Good Progress towards PT goals: Progressing toward goals    Frequency    Min 3X/week      PT Plan Current plan remains appropriate    Co-evaluation             End of Session   Activity Tolerance: Patient tolerated treatment well Patient left: in chair;with call bell/phone within reach;with family/visitor present     Time: 1347-1420 PT Time Calculation (min) (ACUTE ONLY): 33 min  Charges:  $Gait Training: 8-22 mins $Therapeutic Exercise: 8-22 mins  G Codes:      Mottinger, Tessie Fass 07/26/2016, 2:30 PM  07/26/2016  Donnella Sham, PT [phone]  (pager)

## 2016-07-26 NOTE — Care Management Important Message (Signed)
Important Message  Patient Details  Name: Amy [name] MRN: [id number] Date of Birth: 06-30-1946   Medicare Important Message Given:  Yes    [name] Circle 07/26/2016, 9:48 AM

## 2016-07-26 NOTE — Progress Notes (Signed)
PHARMACY - ADULT TOTAL PARENTERAL NUTRITION CONSULT NOTE   Pharmacy Consult:  TPN Indication:  EC fistula  Patient Measurements: Height: 5\' 6"  (167.6 cm) Weight: 280 lb 13.9 oz (127.4 kg) IBW/kg (Calculated) : 59.3 TPN AdjBW (KG): 76.5 Body mass index is 45.33 kg/m.   Assessment:  65 YOF with a recent history of SBO post LoA on 07/07/16 and was discharged to Rehab.  Patient presented on 07/20/16 PM with abdominal pain and drainage from surgical wound.  CT showed early pSBO but clinically patient does not appear obstructed per MD.  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 pSBO on CT, clinically does not appear obstructed per [facility] note 11/1. LBM 11/2  Endo: no hx DM - CBGs all < 113 on TPN at 40 ml/hr Insulin requirements in the past 24 hours: none Lytes: K 3.1 after 4 runs yesterday, phos 2.9, mag 1.9 after 1 gm yesterday, CoCa = 9.74 Renal: SCr WNL, BUN WNL - Flomax; UOP 1700 cc 0.6 ml/kg/hr NS at 50 ml/hr Pulm: OSA  Cards: HTN / PVD -  clonidine, Lopressor  Hepatobil: LFTs / tbili WNL Neuro: chronic pain / anxiety / depression / fibromyalgia / neuropathy - Paxil, Voltaren ID: Vanc/Merrem >>CTX on 10/31  for intra-abd infxn/cellulitis w/ viridans strep and E coli- afebrile, WBC WNL Best Practices: Lovenox TPN Access: PICC  placed 07/24/16 TPN start date: 07/25/16  Nutritional Goals: per RD 10/31 prealb 12 on 11/2 2000-2200 kCal and 125-145 gm protein per day  Current Nutrition:  Regular diet (eating 0-100% of meals) >> NPO TPN at 40 ml/hr with lipids at 10 ml/hr  Plan:  Increase Clinimix E 4.25/10 to 80 ml/hr plus lipids at 10 ml/hr and assess tolerance.  This provides ~ 82 gm protein and 1459 kcals (66% and 73% of goals).  Goal rate of Clinimix E 01/16/09 is 125 ml/hr with lipids at 10 ml/hr  to provide  127.5 gm protein and 2020 Kcals to meet 100% goals.  Unsure if she can tolerate 3 liters/day. Estimated fluid needs 2 - 2.2 liters/day - may need to order daily lasix or may not be able to meed 100% of goal. Will stop NS at 50 ml/hr when TPN rate advanced at 1800. - 1 gm mag followed by 6 runs of K - add MVI/trace elements to TPN - BMET, Mag, Phos, Trig in am - continue empiric SSI   Eudelia Bunch, Pharm.D. [id number] 07/26/2016 7:19 AM

## 2016-07-26 NOTE — Progress Notes (Deleted)
Upon initial assessment, there was mild edema, erythema and pain located on the top of the right foot.  After a couple hours pain, redness and swelling had increased substantially.  A protective foam and gauze was applied.  Patient tolerated well.   Raj Janus, Student Nurse

## 2016-07-26 NOTE — Progress Notes (Signed)
[facility] Progress Note     Subjective: Pain controlled. Denies drainage/leakage around midline dressing. Denies nausea/vomiting. Denies fever/chills. BM last night that was soft/formed/brown.   Objective: Vital signs in last 24 hours: Temp:  [98.5 F (36.9 C)-98.7 F (37.1 C)] 98.7 F (37.1 C) (11/01 2109) Pulse Rate:  [69-72] 70 (11/01 2109) Resp:  [18-20] 20 (11/01 2109) BP: (154-167)/(62-81) 154/76 (11/01 2109) SpO2:  [100 %] 100 % (11/01 2109) Weight:  [280 lb 13.9 oz (127.4 kg)] 280 lb 13.9 oz (127.4 kg) (11/01 2109) Last BM Date: 07/26/16  Intake/Output from previous day: 11/01 0701 - 11/02 0700 In: 1325 [I.V.:1075; IV Piggyback:250] Out: 1701 [Urine:1700; Stool:1] Intake/Output this shift: No intake/output data recorded.  PE: Gen:  Alert, NAD, pleasant and cooperative Card:  RRR, no M/G/R  Pulm:  CTABL Abd: Soft, appropriate tenderness around incision, ND, +BS  Incision: edges granulating well, wound base with some slough, small amount of thin yellow drainage from wound base. Ext:  No erythema, edema, or tenderness   Lab Results:   Recent Labs  07/26/16 0540  WBC 5.0  HGB 9.4*  HCT 29.1*  PLT 464*   BMET  Recent Labs  07/25/16 0955 07/26/16 0540  NA 140 139  K 3.4* 3.1*  CL 104 101  CO2 32 32  GLUCOSE 85 107*  BUN <5* <5*  CREATININE 0.60 0.63  CALCIUM 8.2* 8.3*   CMP     Component Value Date/Time   NA 139 07/26/2016 0540   NA 140 02/14/2016   K 3.1 (L) 07/26/2016 0540   CL 101 07/26/2016 0540   CO2 32 07/26/2016 0540   GLUCOSE 107 (H) 07/26/2016 0540   BUN <5 (L) 07/26/2016 0540   BUN 13 02/14/2016   CREATININE 0.63 07/26/2016 0540   CALCIUM 8.3 (L) 07/26/2016 0540   PROT 5.7 (L) 07/26/2016 0540   ALBUMIN 2.2 (L) 07/26/2016 0540   AST 23 07/26/2016 0540   ALT 17 07/26/2016 0540   ALKPHOS 83 07/26/2016 0540   BILITOT 0.3 07/26/2016 0540   GFRNONAA >60 07/26/2016 0540   GFRAA >60 07/26/2016 0540   Lipase      Component Value Date/Time   LIPASE 61 (H) 07/04/2016 0318   Anti-infectives: Anti-infectives    Start     Dose/Rate Route Frequency Ordered Stop   07/24/16 1400  cefTRIAXone (ROCEPHIN) 2 g in dextrose 5 % 50 mL IVPB     2 g 100 mL/hr over 30 Minutes Intravenous Every 24 hours 07/24/16 1219     07/22/16 1400  meropenem (MERREM) 1 g in sodium chloride 0.9 % 100 mL IVPB  Status:  Discontinued     1 g 200 mL/hr over 30 Minutes Intravenous Every 8 hours 07/22/16 1338 07/24/16 1218   07/21/16 1300  vancomycin (VANCOCIN) 1,250 mg in sodium chloride 0.9 % 250 mL IVPB  Status:  Discontinued     1,250 mg 166.7 mL/hr over 90 Minutes Intravenous Every 12 hours 07/21/16 1028 07/24/16 1218   07/21/16 1030  aztreonam (AZACTAM) 1 g in dextrose 5 % 50 mL IVPB  Status:  Discontinued     1 g 100 mL/hr over 30 Minutes Intravenous Every 8 hours 07/21/16 1028 07/22/16 1337   07/21/16 1000  vancomycin (VANCOCIN) 1,250 mg in sodium chloride 0.9 % 250 mL IVPB  Status:  Discontinued     1,250 mg 166.7 mL/hr over 90 Minutes Intravenous Every 12 hours 07/21/16 0613 07/21/16 1028   07/21/16 1000  aztreonam (AZACTAM) 1  g in dextrose 5 % 50 mL IVPB  Status:  Discontinued     1 g 100 mL/hr over 30 Minutes Intravenous Every 8 hours 07/21/16 0613 07/21/16 1028   07/21/16 0800  metroNIDAZOLE (FLAGYL) IVPB 500 mg  Status:  Discontinued     500 mg 100 mL/hr over 60 Minutes Intravenous Every 8 hours 07/21/16 0613 07/22/16 1337   07/20/16 2345  aztreonam (AZACTAM) 2 g in dextrose 5 % 50 mL IVPB     2 g 100 mL/hr over 30 Minutes Intravenous  Once 07/20/16 2343 07/21/16 0108   07/20/16 2345  metroNIDAZOLE (FLAGYL) IVPB 500 mg     500 mg 100 mL/hr over 60 Minutes Intravenous  Once 07/20/16 2343 07/21/16 0144   07/20/16 2345  vancomycin (VANCOCIN) IVPB 1000 mg/200 mL premix     1,000 mg 200 mL/hr over 60 Minutes Intravenous  Once 07/20/16 2343 07/21/16 0456     Assessment/Plan Abdominal wound infection/ probable  entero-atmospheric fistula S/p diagnostic laparoscopy emergent coversion to open with LOA x 3 hours on 10/13, Dr. Greer Pickerel - CT scan shows no deep fluid collection  - Wound was opened down to the fascia 10/28. Fascia partially dehisced - Cultures: E.coli - WBC 5.0 - BMET pending   CT evidence of early, partial small bowel obstruction - Clinically she does not appear obstructed, having BMs - Bowel regimen PRN  Acute respiratory failure - mild pulmonary effusion on CT - hypoxemia, tachypnea at presentation to ED, resolved   Anemia  HTN - clonidine 0.1 TID, metoprolol 50 BID Chronic pain - tramadol  GERD OSA - no on bi-pap  FEN: NPO, IVF; TPN 11/1 >> ID: aztreonam 10/28-10/29,   Flagyl 10/28-10/29, Ceftriaxone 10/31 >> VTE: SCD's, Lovenox per pharmacy  Plan: continue BID wet-to-dry dressing changes, appreciate WOC seeing for recommendations regarding drainage/timing of possible pouch if drainage increases. Continue TPN  NPO except ice chips, limited sips, and sips with meds   LOS: 4 days    Jill Alexanders , [facility] 07/26/2016, 9:38 AM Pager: [phone] Consults: [phone] Mon-Fri 7:00 am-4:30 pm Sat-Sun 7:00 am-11:30 am

## 2016-07-27 LAB — GLUCOSE, CAPILLARY
Glucose-Capillary: 118 mg/dL — ABNORMAL HIGH (ref 65–99)
Glucose-Capillary: 118 mg/dL — ABNORMAL HIGH (ref 65–99)
Glucose-Capillary: 120 mg/dL — ABNORMAL HIGH (ref 65–99)
Glucose-Capillary: 133 mg/dL — ABNORMAL HIGH (ref 65–99)
Glucose-Capillary: 133 mg/dL — ABNORMAL HIGH (ref 65–99)

## 2016-07-27 LAB — BASIC METABOLIC PANEL
Anion gap: 5 (ref 5–15)
BUN: <5 mg/dL — ABNORMAL LOW (ref 6–20)
CO2: 31 mmol/L (ref 22–32)
Calcium: 8.3 mg/dL — ABNORMAL LOW (ref 8.9–10.3)
Chloride: 103 mmol/L (ref 101–111)
Creatinine, Ser: 0.57 mg/dL (ref 0.44–1.00)
GFR calc Af Amer: >60 mL/min (ref >60)
GFR calc non Af Amer: >60 mL/min (ref >60)
Glucose, Bld: 116 mg/dL — ABNORMAL HIGH (ref 65–99)
Potassium: 3.3 mmol/L — ABNORMAL LOW (ref 3.5–5.1)
Sodium: 139 mmol/L (ref 135–145)

## 2016-07-27 LAB — MAGNESIUM: Magnesium: 2.2 mg/dL (ref 1.7–2.4)

## 2016-07-27 LAB — PHOSPHORUS: Phosphorus: 3.6 mg/dL (ref 2.5–4.6)

## 2016-07-27 LAB — TRIGLYCERIDES: Triglycerides: 148 mg/dL (ref <150)

## 2016-07-27 IMAGING — MR MR LUMBAR SPINE WO/W CM
5 of 10 series · 26 of 48 positions shown · IV contrast (10ml Multihance)
Comparison: Radiographs dated 01/30/2016, CT scan dated 01/30/2016
and MRI dated 12/06/2015

CLINICAL DATA: Bilateral groin pain. Severe lower extremity
weakness. Previous fusion from L1-2 S1

EXAM:
MRI LUMBAR SPINE WITHOUT AND WITH CONTRAST
TECHNIQUE: Multiplanar and multiecho pulse sequences of the lumbar spine were
obtained without and with intravenous contrast.
CONTRAST:  10mL MULTIHANCE GADOBENATE DIMEGLUMINE 529 MG/ML IV SOLN

[Series 3: T2 · sagittal · 4.0mm · 0.55mm/px · 3 of 13 slices shown (1 of 2)]
[im 1/13]
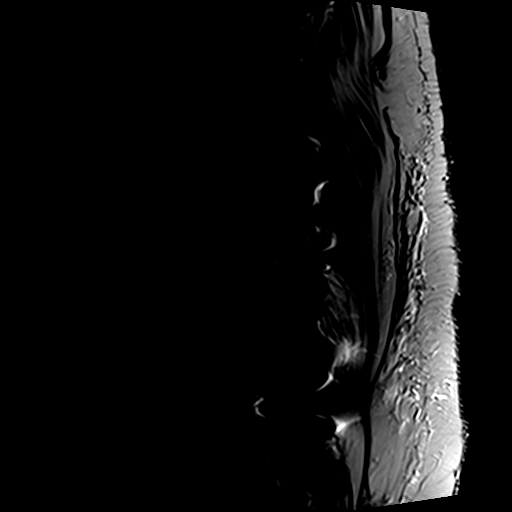
[im 7/13]
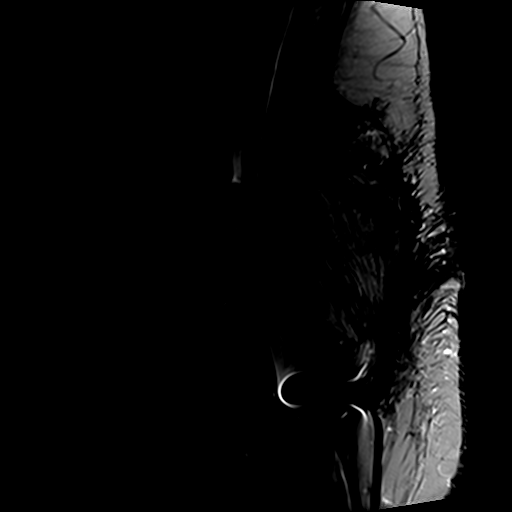
[im 13/13]
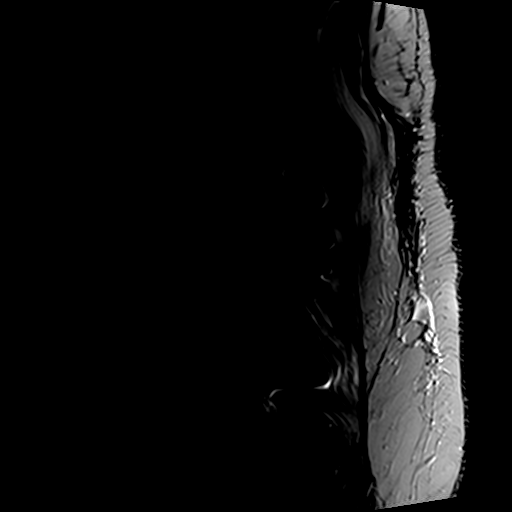

[Series 5: T1 · sagittal · 4.0mm · 0.55mm/px · 3 of 13 slices shown (1 of 2)]
[im 1/13]
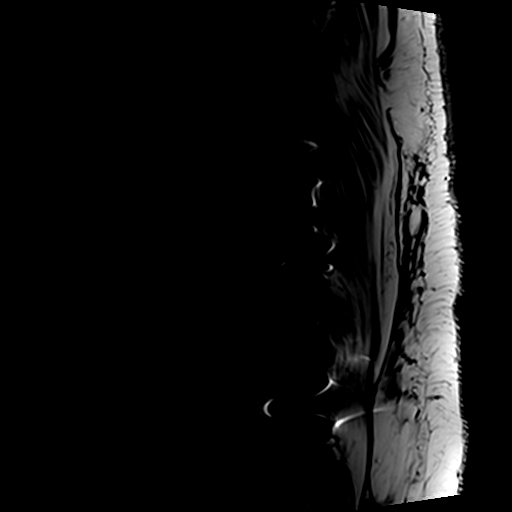
[im 7/13]
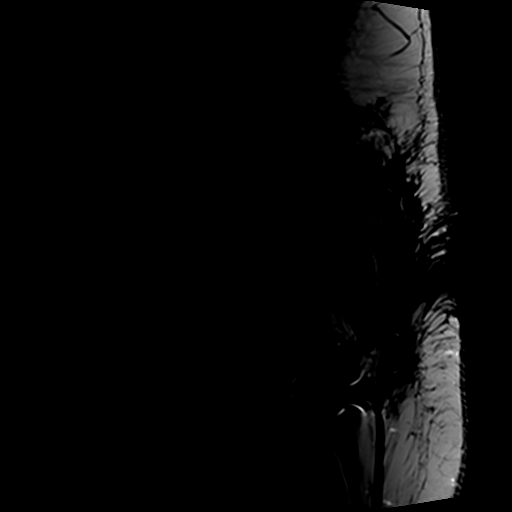
[im 13/13]
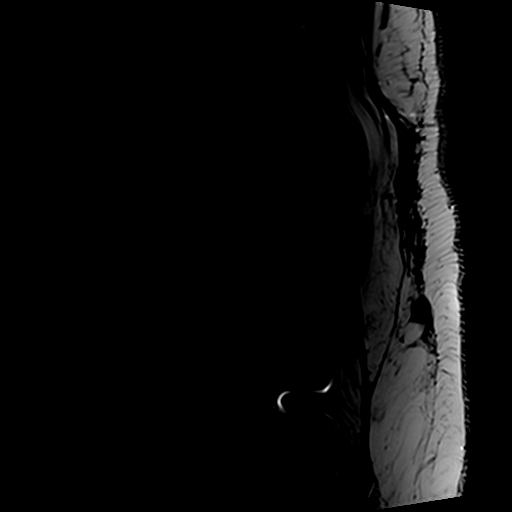

[Series 6: T2 · axial · 4.0mm · 0.78mm/px · z∈[-95,+114]mm · 9 of 39 slices shown (2 of 2)]
[im 1/39]
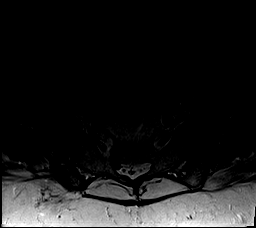
[im 5/39]
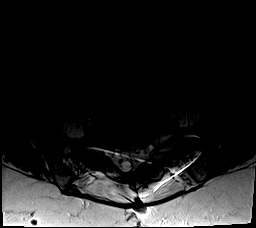
[im 10/39]
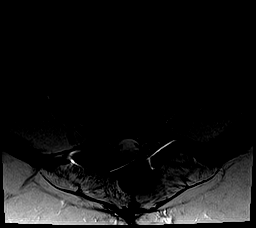
[im 15/39]
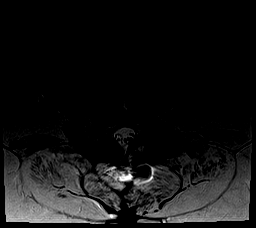
[im 20/39]
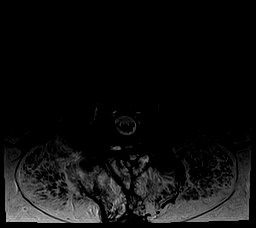
[im 24/39]
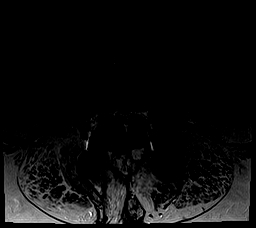
[im 29/39]
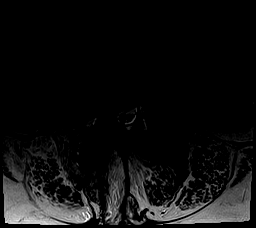
[im 34/39]
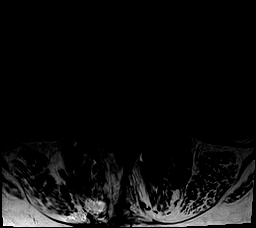
[im 39/39]
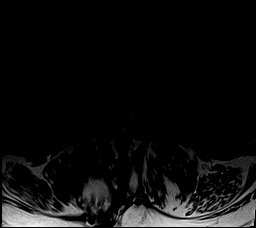

[Series 7: T1 · axial · 4.0mm · 0.39mm/px · z∈[-95,+114]mm · 9 of 39 slices shown (2 of 2)]
[im 1/39]
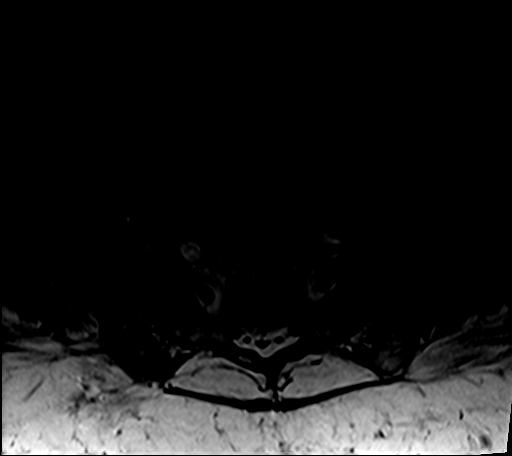
[im 5/39]
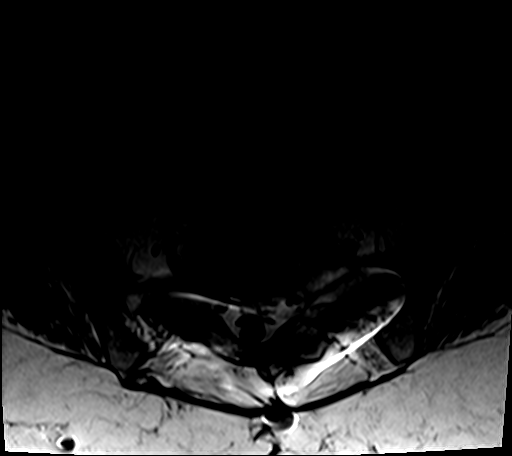
[im 10/39]
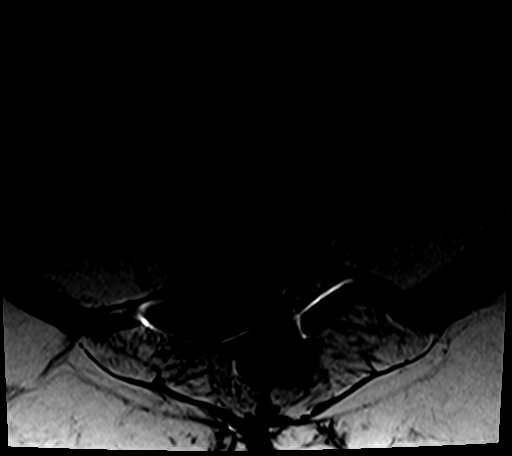
[im 15/39]
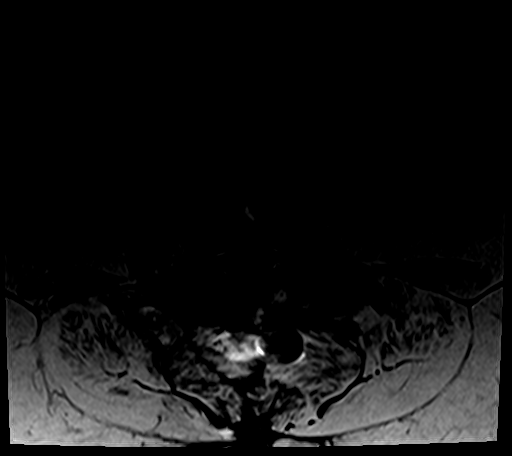
[im 20/39]
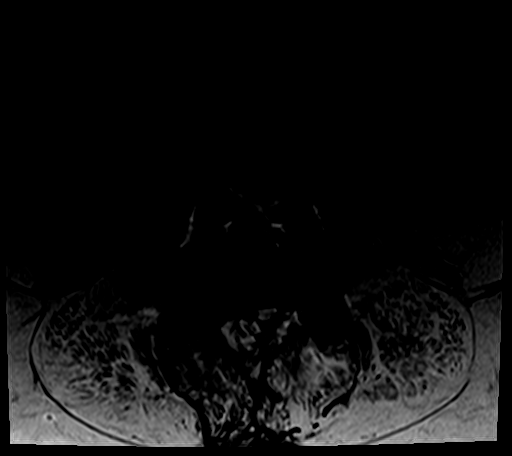
[im 24/39]
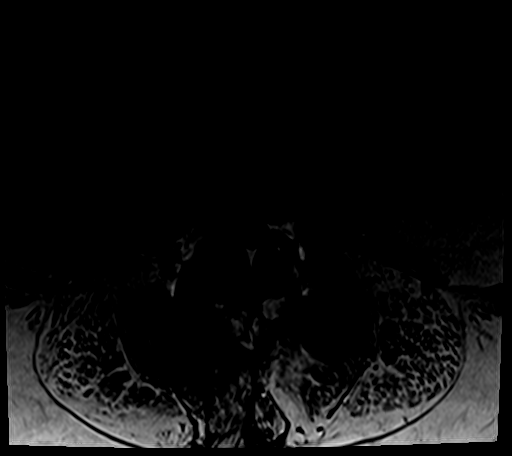
[im 29/39]
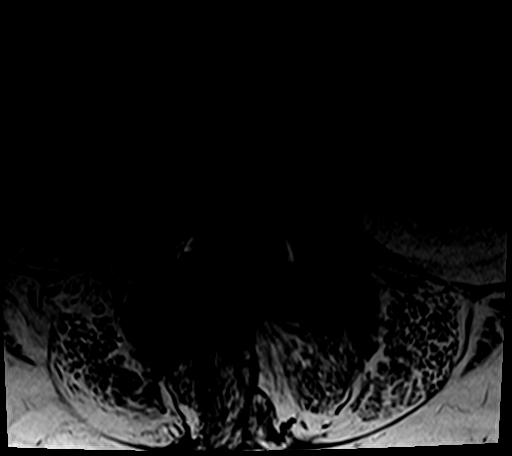
[im 34/39]
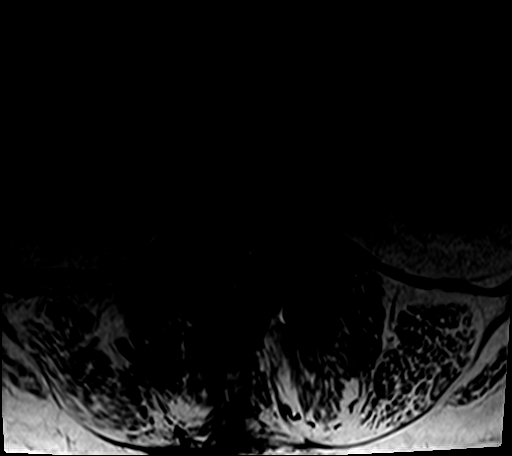
[im 39/39]
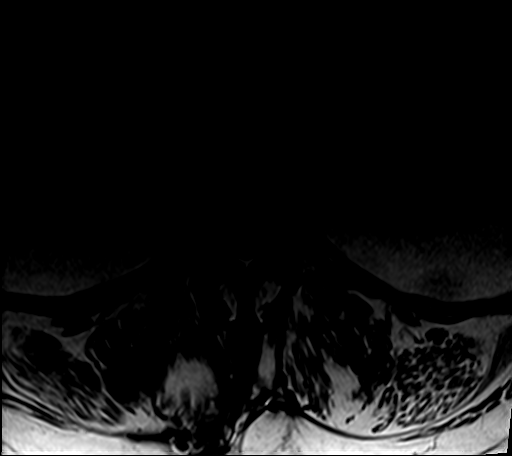

[Series 8: T1 fat-sat post-contrast · sagittal · 4.0mm · 0.55mm/px · 2 of 13 slices shown]
[im 1/13]
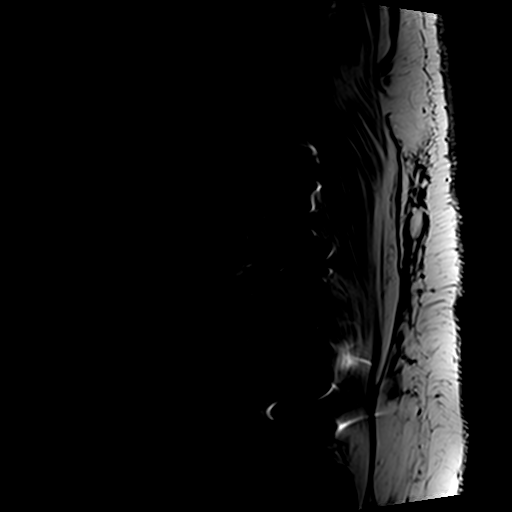
[im 7/13]
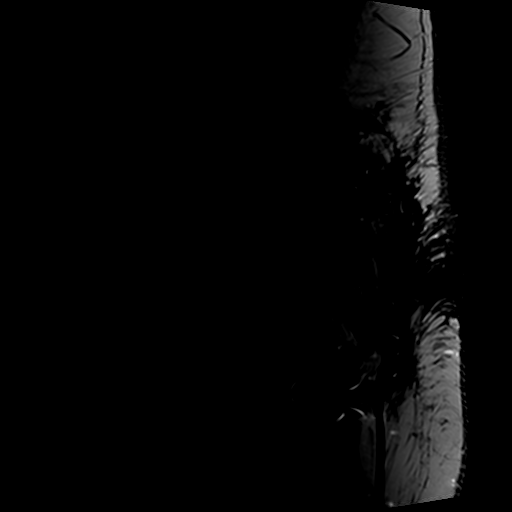

[26 of 48 positions shown; findings below may reference images not displayed]

FINDINGS: Segmentation:  Normal.

Alignment:  Normal.

Vertebrae:  No acute abnormality.  Previous fusion from L1-2 S1.

Conus medullaris: Extends to the L1 level and appears normal.

Paraspinal and other soft tissues: No acute abnormalities. Fatty
atrophy of the posterior paraspinal musculature with scarring in the
soft tissues from prior surgery. These are stable since the prior
study.

Disc levels: T11-12 and T12-L1:  Normal.

L1-2: Interbody fusion. Interval removal of the right pedicle screw.
Small amount of fluid adjacent to the right facet joint which is
probably a small seroma. This is not felt to be significant.
Scarring around the thecal sac to the expected degree.

L2-3:  Normal disc.  Solid posterior fusion.

L3-4: Solid interbody and posterior fusion. Slight scarring around
the thecal sac with the expected degree of enhancement after
contrast administration. No residual or recurrent neural
impingement.

L4-5 and L5-S1: Solid anterior and posterior fusion. No appreciable
scar tissue around the thecal sac or nerve roots. No neural
impingement. No change since the prior study.
IMPRESSION: 1. Interval removal of the right pedicle screw at L1. Small seroma
in the posterior soft tissues adjacent to the right facet joint at
that level which is felt to be postsurgical in origin.
2. Expected degree of scar tissue and enhancement around the thecal
sac and nerve roots at L1-2 and L3-4.
3. No visible neural impingement.

## 2016-07-27 MED ORDER — TRACE MINERALS CR-CU-MN-SE-ZN 10-1000-500-60 MCG/ML IV SOLN
INTRAVENOUS | Status: DC
  Filled 2016-07-27: qty 1920

## 2016-07-27 MED ORDER — FAT EMULSION 20 % IV EMUL
240.0000 mL | INTRAVENOUS | Status: DC
  Filled 2016-07-27: qty 250

## 2016-07-27 MED ORDER — POTASSIUM CHLORIDE 10 MEQ/50ML IV SOLN
10.0000 meq | INTRAVENOUS | Status: AC
  Administered 2016-07-27 (×5): 10 meq via INTRAVENOUS
  Filled 2016-07-27 (×5): qty 50

## 2016-07-27 NOTE — Progress Notes (Signed)
Subjective: Tolerating clears, doing OK  Objective: Vital signs in last 24 hours: Temp:  [98 F (36.7 C)-98.5 F (36.9 C)] 98.2 F (36.8 C) (11/03 1000) Pulse Rate:  [66-96] 96 (11/03 1000) Resp:  [16-19] 18 (11/03 1000) BP: (154-189)/(63-90) 189/90 (11/03 1000) SpO2:  [97 %-99 %] 99 % (11/03 1000) Weight:  [127.3 kg (280 lb 10.3 oz)] 127.3 kg (280 lb 10.3 oz) (11/02 2037) Last BM Date: 06/25/16  Intake/Output from previous day: 11/02 0701 - 11/03 0700 In: 2491 [P.O.:300; I.V.:1841; IV Piggyback:350] Out: 2200 [Urine:2200] Intake/Output this shift: Total I/O In: 360 [P.O.:360] Out: 750 [Urine:750]  GI: soft, wound with no enteric drainage, fibrinous exudate along fascia  Lab Results:   Recent Labs  07/26/16 0540  WBC 5.0  HGB 9.4*  HCT 29.1*  PLT 464*   BMET  Recent Labs  07/26/16 0540 07/27/16 0415  NA 139 139  K 3.1* 3.3*  CL 101 103  CO2 32 31  GLUCOSE 107* 116*  BUN <5* <5*  CREATININE 0.63 0.57  CALCIUM 8.3* 8.3*   PT/INR No results for input(s): LABPROT, INR in the last 72 hours. ABG No results for input(s): PHART, HCO3 in the last 72 hours.  Invalid input(s): PCO2, PO2  Studies/Results: No results found.  Anti-infectives: Anti-infectives    Start     Dose/Rate Route Frequency Ordered Stop   07/24/16 1400  cefTRIAXone (ROCEPHIN) 2 g in dextrose 5 % 50 mL IVPB     2 g 100 mL/hr over 30 Minutes Intravenous Every 24 hours 07/24/16 1219     07/22/16 1400  meropenem (MERREM) 1 g in sodium chloride 0.9 % 100 mL IVPB  Status:  Discontinued     1 g 200 mL/hr over 30 Minutes Intravenous Every 8 hours 07/22/16 1338 07/24/16 1218   07/21/16 1300  vancomycin (VANCOCIN) 1,250 mg in sodium chloride 0.9 % 250 mL IVPB  Status:  Discontinued     1,250 mg 166.7 mL/hr over 90 Minutes Intravenous Every 12 hours 07/21/16 1028 07/24/16 1218   07/21/16 1030  aztreonam (AZACTAM) 1 g in dextrose 5 % 50 mL IVPB  Status:  Discontinued     1 g 100 mL/hr over  30 Minutes Intravenous Every 8 hours 07/21/16 1028 07/22/16 1337   07/21/16 1000  vancomycin (VANCOCIN) 1,250 mg in sodium chloride 0.9 % 250 mL IVPB  Status:  Discontinued     1,250 mg 166.7 mL/hr over 90 Minutes Intravenous Every 12 hours 07/21/16 0613 07/21/16 1028   07/21/16 1000  aztreonam (AZACTAM) 1 g in dextrose 5 % 50 mL IVPB  Status:  Discontinued     1 g 100 mL/hr over 30 Minutes Intravenous Every 8 hours 07/21/16 0613 07/21/16 1028   07/21/16 0800  metroNIDAZOLE (FLAGYL) IVPB 500 mg  Status:  Discontinued     500 mg 100 mL/hr over 60 Minutes Intravenous Every 8 hours 07/21/16 0613 07/22/16 1337   07/20/16 2345  aztreonam (AZACTAM) 2 g in dextrose 5 % 50 mL IVPB     2 g 100 mL/hr over 30 Minutes Intravenous  Once 07/20/16 2343 07/21/16 0108   07/20/16 2345  metroNIDAZOLE (FLAGYL) IVPB 500 mg     500 mg 100 mL/hr over 60 Minutes Intravenous  Once 07/20/16 2343 07/21/16 0144   07/20/16 2345  vancomycin (VANCOCIN) IVPB 1000 mg/200 mL premix     1,000 mg 200 mL/hr over 60 Minutes Intravenous  Once 07/20/16 2343 07/21/16 0456      Assessment/Plan: Abdominal  wound infection S/p diagnostic laparoscopy emergent coversion to open with LOA x 3 hours on 10/13, Dr. Greer Pickerel No evidence of fistula at this time  Anemia  HTN - clonidine 0.1 TID, metoprolol 50 BID Chronic pain - tramadol  GERD OSA - no on bi-pap VTE: SCD's, Lovenox per pharmacy  Plan: continue BID wet-to-dry dressing changes, advance to soft diet, D/C TNA after this bag  LOS: 5 days    THOMPSON,BURKE E 07/27/2016

## 2016-07-27 NOTE — Progress Notes (Addendum)
PHARMACY - ADULT TOTAL PARENTERAL NUTRITION CONSULT NOTE   Pharmacy Consult: TPN Indication: EC fistula  Patient Measurements: Height: 5\' 6"  (167.6 cm) Weight: 280 lb 10.3 oz (127.3 kg) IBW/kg (Calculated) : 59.3 TPN AdjBW (KG): 76.5 Body mass index is 45.3 kg/m.  Assessment:  25 YOF with a recent history of SBO post LoA on 07/07/16 and was discharged to Rehab.  Patient presented on 07/20/16 PM with abdominal pain and drainage from surgical wound.  CT showed early pSBO but clinically patient does not appear obstructed per MD. 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 No output recorded in EPIC. pSBO on CT, clinically does not appear obstructed per [facility] note 11/1. LBM 11/2. CCS slowly advancing diet. Tolerated clears and trauma today ok with stopping TPN after bag runs out tonight Endo: no hx DM. CBGs controlled < 140s. Not on SSI Insulin requirements in the past 24 hours: none Lytes: wnl exc K low at 3.3 even after replacement of 6 runs yesterday. Mg up to 2.2 after replacement, phos ok at 3.6. CoCa 9.6 Renal: SCr wnl, CrCl ~26ml/min. UOP ok at 0.54ml/kg/hr. Flomax Pulm: OSA Cards: HTN / PVD. On clonidine, Lopressor  Hepatobil: LFTs / tbili WNL Neuro: chronic pain / anxiety / depression / fibromyalgia / neuropathy - Paxil, Voltaren ID: Continues on CTX for intra-abd infxn/cellulitis w/ viridans strep and E coli. Afebrile, WBC wnl.  Best Practices: Lovenox TPN Access: PICC placed 07/24/16 > TPN start date: 07/25/16 >>  Nutritional Goals: per RD 11/1 KCal: 2000-2200  Protein: 125-145 g  Goal Clinimix is E 4.25/10 at 165ml/hr + IV lipid emulsion to provide 127.5g of protein and 2020 kcal per day to meet goals  Current Nutrition:  Clear liquids Clinimix + IV lipid emulsions  Plan:   Stop TPN after current bag runs out at 1800 tonight per  Trauma  Give KCl 30mEq x 5 runs today Check Bmet tomorrow  Elenor Quinones, PharmD, BCPS Clinical Pharmacist Pager [phone] 07/27/2016 9:26 AM

## 2016-07-28 LAB — BASIC METABOLIC PANEL
Anion gap: 8 (ref 5–15)
BUN: 9 mg/dL (ref 6–20)
CO2: 29 mmol/L (ref 22–32)
Calcium: 8.5 mg/dL — ABNORMAL LOW (ref 8.9–10.3)
Chloride: 101 mmol/L (ref 101–111)
Creatinine, Ser: 0.69 mg/dL (ref 0.44–1.00)
GFR calc Af Amer: >60 mL/min (ref >60)
GFR calc non Af Amer: >60 mL/min (ref >60)
Glucose, Bld: 96 mg/dL (ref 65–99)
Potassium: 3.8 mmol/L (ref 3.5–5.1)
Sodium: 138 mmol/L (ref 135–145)

## 2016-07-28 LAB — CBC
HCT: 30.2 % — ABNORMAL LOW (ref 36.0–46.0)
Hemoglobin: 9.8 g/dL — ABNORMAL LOW (ref 12.0–15.0)
MCH: 29.3 pg (ref 26.0–34.0)
MCHC: 32.5 g/dL (ref 30.0–36.0)
MCV: 90.4 fL (ref 78.0–100.0)
Platelets: 416 10*3/uL — ABNORMAL HIGH (ref 150–400)
RBC: 3.34 MIL/uL — ABNORMAL LOW (ref 3.87–5.11)
RDW: 15.6 % — ABNORMAL HIGH (ref 11.5–15.5)
WBC: 5.6 10*3/uL (ref 4.0–10.5)

## 2016-07-28 LAB — GLUCOSE, CAPILLARY
Glucose-Capillary: 98 mg/dL (ref 65–99)
Glucose-Capillary: 104 mg/dL — ABNORMAL HIGH (ref 65–99)

## 2016-07-28 NOTE — Progress Notes (Signed)
Patient complained of pain in her right arm, below her PICC & feels the right arm is a little swollen.  MD notified.  Jillyn Ledger, MBA, BSN, RN

## 2016-07-28 NOTE — Clinical Social Work Note (Signed)
Clinical Social Work Assessment  Patient Details  Name: Amy [name] MRN: [id number] Date of Birth: Jul 28, 1946  Date of referral:  07/28/16               Reason for consult:  Discharge Planning                Permission sought to share information with:  Family Supports Permission granted to share information::  Yes, Verbal Permission Granted  Name::     Ray  Agency::     Relationship::  Spouse  Contact Information:     Housing/Transportation Living arrangements for the past 2 months:  [place], [place] of Information:  Patient Patient Interpreter Needed:  None Criminal Activity/Legal Involvement Pertinent to Current Situation/Hospitalization:  No - Comment as needed Significant Relationships:  Spouse, Adult Children Lives with:  Spouse Do you feel safe going back to the place where you live?  Yes Need for family participation in patient care:  Yes (Comment)  Care giving concerns: Pt was admitted from SNF.   Social Worker assessment / plan:  CSW received referral due to pt being admitted from [facility]. CSW reviewed chart and met with pt at bedside. No visitors were present.  Pt reports she was admitted to [facility] about a week ago and was at facility for 3-4 days. Pt was hospitalized but feels that SNF is still needed. Pt will speak with family but at this time plans to return to SNF. CSW offered to call family but pt reports she will update them when they come to visit.  FL2 completed and sent to [facility].   Employment status:  Retired Forensic scientist:  Medicare PT Recommendations:  [place] / Referral to community resources:  [place]  Patient/Family's Response to care:  Pt was alert and oriented and engaged during session.  Patient/Family's Understanding of and Emotional Response to Diagnosis, Current Treatment, and Prognosis:  Pt understanding that she will require  rehab at DC and plans to return to SNF.  Emotional Assessment Appearance:  Appears stated age Attitude/Demeanor/Rapport:  Other (Appropriate) Affect (typically observed):  Accepting, Appropriate, Calm Orientation:  Oriented to Self, Oriented to Place, Oriented to  Time, Oriented to Situation Alcohol / Substance use:  Not Applicable Psych involvement (Current and /or in the community):  No (Comment)  Discharge Needs  Concerns to be addressed:  No discharge needs identified Readmission within the last 30 days:  Yes Current discharge risk:  None Barriers to Discharge:  No Barriers Identified   Boone Master, [place] 07/28/2016, 11:10 AM Weekend Coverage

## 2016-07-28 NOTE — Progress Notes (Signed)
Subjective: Pt doing well Tol soft diet  Objective: Vital signs in last 24 hours: Temp:  [98.3 F (36.8 C)-98.6 F (37 C)] 98.5 F (36.9 C) (11/04 0949) Pulse Rate:  [65-70] 66 (11/04 0949) Resp:  [18-20] 18 (11/04 0949) BP: (121-159)/(54-73) 141/70 (11/04 0949) SpO2:  [95 %-100 %] 95 % (11/04 0949) Weight:  [127.2 kg (280 lb 6.8 oz)] 127.2 kg (280 lb 6.8 oz) (11/03 2057) Last BM Date: 07/27/16  Intake/Output from previous day: 11/03 0701 - 11/04 0700 In: [id number] [P.O.:660; I.V.:1080; IV Piggyback:50] Out: 1050 [Urine:1050] Intake/Output this shift: Total I/O In: 120 [P.O.:120] Out: 300 [Urine:300]  General appearance: alert and cooperative GI: soft, non-tender; bowel sounds normal; no masses,  no organomegaly and incisional, c/d/i, no succus output  Lab Results:   Recent Labs  07/26/16 0540 07/28/16 0500  WBC 5.0 5.6  HGB 9.4* 9.8*  HCT 29.1* 30.2*  PLT 464* 416*   BMET  Recent Labs  07/27/16 0415 07/28/16 0500  NA 139 138  K 3.3* 3.8  CL 103 101  CO2 31 29  GLUCOSE 116* 96  BUN <5* 9  CREATININE 0.57 0.69  CALCIUM 8.3* 8.5*   PT/INR No results for input(s): LABPROT, INR in the last 72 hours. ABG No results for input(s): PHART, HCO3 in the last 72 hours.  Invalid input(s): PCO2, PO2  Studies/Results: No results found.  Anti-infectives: Anti-infectives    Start     Dose/Rate Route Frequency Ordered Stop   07/24/16 1400  cefTRIAXone (ROCEPHIN) 2 g in dextrose 5 % 50 mL IVPB     2 g 100 mL/hr over 30 Minutes Intravenous Every 24 hours 07/24/16 1219     07/22/16 1400  meropenem (MERREM) 1 g in sodium chloride 0.9 % 100 mL IVPB  Status:  Discontinued     1 g 200 mL/hr over 30 Minutes Intravenous Every 8 hours 07/22/16 1338 07/24/16 1218   07/21/16 1300  vancomycin (VANCOCIN) 1,250 mg in sodium chloride 0.9 % 250 mL IVPB  Status:  Discontinued     1,250 mg 166.7 mL/hr over 90 Minutes Intravenous Every 12 hours 07/21/16 1028 07/24/16 1218   07/21/16 1030  aztreonam (AZACTAM) 1 g in dextrose 5 % 50 mL IVPB  Status:  Discontinued     1 g 100 mL/hr over 30 Minutes Intravenous Every 8 hours 07/21/16 1028 07/22/16 1337   07/21/16 1000  vancomycin (VANCOCIN) 1,250 mg in sodium chloride 0.9 % 250 mL IVPB  Status:  Discontinued     1,250 mg 166.7 mL/hr over 90 Minutes Intravenous Every 12 hours 07/21/16 0613 07/21/16 1028   07/21/16 1000  aztreonam (AZACTAM) 1 g in dextrose 5 % 50 mL IVPB  Status:  Discontinued     1 g 100 mL/hr over 30 Minutes Intravenous Every 8 hours 07/21/16 0613 07/21/16 1028   07/21/16 0800  metroNIDAZOLE (FLAGYL) IVPB 500 mg  Status:  Discontinued     500 mg 100 mL/hr over 60 Minutes Intravenous Every 8 hours 07/21/16 0613 07/22/16 1337   07/20/16 2345  aztreonam (AZACTAM) 2 g in dextrose 5 % 50 mL IVPB     2 g 100 mL/hr over 30 Minutes Intravenous  Once 07/20/16 2343 07/21/16 0108   07/20/16 2345  metroNIDAZOLE (FLAGYL) IVPB 500 mg     500 mg 100 mL/hr over 60 Minutes Intravenous  Once 07/20/16 2343 07/21/16 0144   07/20/16 2345  vancomycin (VANCOCIN) IVPB 1000 mg/200 mL premix     1,000 mg 200  mL/hr over 60 Minutes Intravenous  Once 07/20/16 2343 07/21/16 0456      Assessment/Plan: Abdominal wound infection S/p diagnostic laparoscopy emergent coversion to open with LOA x 3 hours on 10/13, Dr. Greer Pickerel No evidence of fistula at this time  Anemia  HTN - clonidine 0.1 TID, metoprolol 50 BID Chronic pain - tramadol  GERD OSA - no on bi-pap VTE: SCD's, Lovenox per pharmacy  Plan: continue BID wet-to-dry dressing changes, advance to regdiet Hopefully home in next 1-2d Will need to learn to pack wound prior to DC home   LOS: 6 days    Rosario Jacks., [facility] 07/28/2016

## 2016-07-28 NOTE — NC FL2 (Signed)
[place] MEDICAID FL2 LEVEL OF CARE SCREENING TOOL     IDENTIFICATION  Patient Name: Amy [name] Birthdate: 1946/04/07 Sex: female Admission Date (Current Location): 07/20/2016  [facility] and [place] Number:  Herbalist and Address:  [facility], [place] [address], [place], [place] [address]      Provider Number: [id number]  Attending Physician Name and Address:  Nolon Nations, MD  Relative Name and Phone Number:  Johnell Comings; [phone] (h)  [phone] (mobile)     Current Level of Care: Hospital Recommended Level of Care: [place] Prior Approval Number:    Date Approved/Denied:   PASRR Number: [id number]  Discharge Plan: SNF    Current Diagnoses: Patient Active Problem List   Diagnosis Date Noted  . Wound dehiscence 07/22/2016  . Dyspnea 07/21/2016  . Acute respiratory failure ([place]) 07/21/2016  . Acute encephalopathy 07/21/2016  . Anemia 07/21/2016  . Pancreatitis 07/02/2016  . SBO (small bowel obstruction) 07/02/2016  . Long term current use of opiate analgesic 06/26/2016  . Long term prescription opiate use 06/26/2016  . Opiate use 06/26/2016  . Encounter for therapeutic drug level monitoring 06/26/2016  . Encounter for pain management planning 06/26/2016  . Chronic pain 06/26/2016  . Disturbance of skin sensation 06/26/2016  . Chronic low back pain (Location of Primary Source of Pain) (Bilateral) (R>L) 06/26/2016  . Chronic hip pain (Location of Secondary source of pain) (Bilateral) (R>L) 06/26/2016  . 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 Chronic shoulder pain (Location of Tertiary source of pain) (Bilateral) (R>L) 06/26/2016  . 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 Chronic knee pain (Bilateral) (R>L) 06/26/2016  . 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 Chronic neck pain (Right) 06/26/2016  . Lumbar spondylosis 06/26/2016  .  Lumbar facet syndrome 06/26/2016  . Lumbar facet hypertrophy 06/26/2016  . 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 Neurogenic pain 06/26/2016  . Occipital headaches 06/26/2016  . 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 History of lumbar fusion 06/26/2016  . 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  . Neuropathic pain of both legs 10/13/2015  . Constipation 10/13/2015  . Complete rotator cuff tear of left shoulder 05/05/2014  . Depression 08/06/2013  . Hypokalemia 08/06/2013  . GERD (gastroesophageal reflux disease) 08/06/2013  . Spinal stenosis of lumbar region 08/06/2013  . Bladder spasm 08/06/2013  . Obesity, Class III, BMI 40-49.9 (morbid obesity) ([place]) 02/03/2013  . Lipoma of buttock s/p excision 02/20/2013 02/03/2013  . DYSPNEA 01/23/2008  . Obstructive sleep apnea 09/12/2007  . HTN (hypertension) 09/12/2007  . Allergic rhinitis 09/12/2007  . SLEEPINESS 09/12/2007  . ANGINA, HX OF 09/12/2007    Orientation RESPIRATION BLADDER Height & Weight     Self, Time, Situation, Place  Normal Continent Weight: 280 lb 6.8 oz (127.2 kg) Height:  5\' 6"  (167.6 cm)  BEHAVIORAL SYMPTOMS/MOOD NEUROLOGICAL BOWEL NUTRITION STATUS      Continent Diet, TNA (Clear liquids; Clinimix + IV lipid emulsions)  AMBULATORY STATUS COMMUNICATION OF NEEDS Skin   Limited Assist Verbally PU Stage and Appropriate Care, Surgical wounds   PU Stage 2 Dressing: BID                   Personal Care Assistance Level of Assistance  Bathing, Feeding, Dressing Bathing Assistance: Limited assistance Feeding assistance: Limited assistance Dressing Assistance: Limited assistance     Functional Limitations Info  Sight, Hearing, Speech Sight Info: Adequate  Hearing Info: Adequate Speech Info: Adequate    SPECIAL CARE FACTORS FREQUENCY  PT (By licensed PT), OT (By licensed OT)     PT Frequency: Evaluation 10/29 and a minimum of 3X per week therapy recommended               Contractures Contractures Info: Not present    Additional Factors Info  Code Status, Allergies Code Status Info: DNR Allergies Info: Latex, Penicillins           Current Medications (07/28/2016):  This is the current hospital active medication list Current Facility-Administered Medications  Medication Dose Route Frequency Provider Last Rate Last Dose  . acetaminophen (TYLENOL) tablet 1,000 mg  1,000 mg Oral Q8H PRN Radene Gunning, NP   1,000 mg at 07/26/16 1750  . calcium-vitamin D (OSCAL WITH D) 500-200 MG-UNIT per tablet 1 tablet  1 tablet Oral Q breakfast Radene Gunning, NP   1 tablet at 07/28/16 1015  . cefTRIAXone (ROCEPHIN) 2 g in dextrose 5 % 50 mL IVPB  2 g Intravenous Q24H Jill Alexanders, PA-C   2 g at 07/27/16 1638  . cloNIDine (CATAPRES) tablet 0.1 mg  0.1 mg Oral TID Radene Gunning, NP   0.1 mg at 07/28/16 1017  . diclofenac (VOLTAREN) EC tablet 50 mg  50 mg Oral TID Nita Sells, MD   50 mg at 07/28/16 1022  . enoxaparin (LOVENOX) injection 60 mg  60 mg Subcutaneous Q24H Erenest Blank, RPH   60 mg at 07/28/16 1010  . estradiol (ESTRACE) vaginal cream 1 Applicatorful  1 Applicatorful Vaginal Once per day on Mon Thu Radene Gunning, NP   1 Applicatorful at [id number] [phone]  . fluticasone (FLONASE) 50 MCG/ACT nasal spray 1 spray  1 spray Each Nare Daily Radene Gunning, NP   1 spray at 07/28/16 1023  . HYDROmorphone (DILAUDID) injection 0.2-0.4 mg  0.2-0.4 mg Intravenous Q2H PRN Clovis Riley, MD   0.4 mg at 07/25/16 0309  . l-methylfolate-B6-B12 (METANX) 3-35-2 MG per tablet 1 tablet  1 tablet Oral BID Radene Gunning, NP   1 tablet at 07/28/16 1014  . metoprolol tartrate (LOPRESSOR) tablet 50 mg  50 mg Oral BID Radene Gunning, NP   50 mg at 07/28/16 1022  . morphine 2 MG/ML injection 1-4 mg  1-4 mg Intravenous Q1H PRN Fanny Skates, MD   2 mg at 07/28/16 0651  . naloxone [facility]) injection 0.4 mg  0.4 mg Intravenous PRN Elwin Mocha, MD      . ondansetron  [facility]) tablet 4 mg  4 mg Oral Q6H PRN Radene Gunning, NP       Or  . ondansetron [facility]) injection 4 mg  4 mg Intravenous Q6H PRN Radene Gunning, NP   4 mg at 07/22/16 0946  . PARoxetine (PAXIL) tablet 40 mg  40 mg Oral Daily Radene Gunning, NP   40 mg at 07/28/16 1023  . polyethylene glycol (MIRALAX / GLYCOLAX) packet 17 g  17 g Oral Daily Darci Current Simaan, PA-C   17 g at 07/28/16 1009  . senna-docusate (Senokot-S) tablet 2 tablet  2 tablet Oral BID Radene Gunning, NP   2 tablet at 07/28/16 1019  . sodium chloride flush (NS) 0.9 % injection 10-40 mL  10-40 mL Intracatheter PRN Fanny Skates, MD   10 mL at 07/25/16 1750  . sodium chloride flush (NS) 0.9 % injection 3 mL  3 mL Intravenous Q12H  Radene Gunning, NP   3 mL at 07/27/16 1035  . tamsulosin (FLOMAX) capsule 0.4 mg  0.4 mg Oral QPC supper Radene Gunning, NP   0.4 mg at 07/27/16 1830  . traMADol (ULTRAM) tablet 50 mg  50 mg Oral Q6H PRN Radene Gunning, NP   50 mg at 07/27/16 2205     Discharge Medications: Please see discharge summary for a list of discharge medications.  Relevant Imaging Results:  Relevant Lab Results:   Additional Information [id number]  Boone Master, LCSW Weekend Coverage

## 2016-07-29 LAB — GLUCOSE, CAPILLARY
Glucose-Capillary: 109 mg/dL — ABNORMAL HIGH (ref 65–99)
Glucose-Capillary: 121 mg/dL — ABNORMAL HIGH (ref 65–99)
Glucose-Capillary: 91 mg/dL (ref 65–99)

## 2016-07-29 MED ORDER — TAMSULOSIN HCL 0.4 MG PO CAPS
0.4000 mg | ORAL_CAPSULE | Freq: Every day | ORAL | Status: DC
  Administered 2016-07-30 – 2016-08-01 (×3): 0.4 mg via ORAL
  Filled 2016-07-29 (×3): qty 1

## 2016-07-30 LAB — GLUCOSE, CAPILLARY
Glucose-Capillary: 104 mg/dL — ABNORMAL HIGH (ref 65–99)
Glucose-Capillary: 105 mg/dL — ABNORMAL HIGH (ref 65–99)
Glucose-Capillary: 117 mg/dL — ABNORMAL HIGH (ref 65–99)

## 2016-07-30 MED ORDER — ENSURE ENLIVE PO LIQD
237.0000 mL | Freq: Two times a day (BID) | ORAL | Status: DC
  Administered 2016-07-30 – 2016-08-01 (×4): 237 mL via ORAL

## 2016-07-30 MED ORDER — HYDROMORPHONE HCL 1 MG/ML IJ SOLN
0.2000 mg | INTRAMUSCULAR | Status: DC | PRN
  Administered 2016-07-30 (×2): 0.4 mg via INTRAVENOUS
  Filled 2016-07-30 (×2): qty 1

## 2016-07-30 MED ORDER — FLUCONAZOLE 100 MG PO TABS
150.0000 mg | ORAL_TABLET | Freq: Once | ORAL | Status: AC
  Administered 2016-07-30: 150 mg via ORAL
  Filled 2016-07-30: qty 2

## 2016-07-30 MED ORDER — FLUCONAZOLE 100 MG PO TABS: 150.0000 mg | ORAL_TABLET | Freq: Once | ORAL | Status: DC

## 2016-07-30 NOTE — Progress Notes (Signed)
Nutrition Follow-up  DOCUMENTATION CODES:   Morbid obesity  INTERVENTION:  Provide Ensure Enlive po BID, each supplement provides 350 kcal and 20 grams of protein.  Encourage adequate PO intake.   NUTRITION DIAGNOSIS:   Inadequate oral intake related to inability to eat as evidenced by NPO status; diet advanced, po 80-100%; improving  GOAL:   Patient will meet greater than or equal to 90% of their needs; progressing  MONITOR:   PO intake, Supplement acceptance, Labs, Weight trends, Skin, I & O's  REASON FOR ASSESSMENT:   Consult Wound healing  ASSESSMENT:   70 y.o. female with medical history significant for recent sbo s/p surgical repair 2 weeks ago, htn, chronic pain (knee, back, shoulder,neck) obesity, anxiety [facility] emergency department chief complaint of increased drainage from incision for 4 days and generalized weakness. Initial evaluation reveals foul-smelling draining wound tachypnea and hypoxia  TPN stopped 11/3. Pt is currently on a heart diet has been tolerating it. Pt reports intake has been improving daily. Pt is agreeable to nutritional supplements to aid in wound healing. RD to order. Per MD note, no signs of fistula on exam. Labs and medications reviewed.   Diet Order:  Diet Heart Room service appropriate? Yes; Fluid consistency: Thin  Skin:  Wound (see comment) (stage 2 on buttocks, dehisced abdominal wound)  Last BM:  11/6  Height:   Ht Readings from Last 1 Encounters:  07/24/16 5\' 6"  (1.676 m)    Weight:   Wt Readings from Last 1 Encounters:  07/29/16 282 lb 13.6 oz (128.3 kg)    Ideal Body Weight:  57.9 kg  BMI:  Body mass index is 45.65 kg/m.  Estimated Nutritional Needs:   Kcal:  2000-2200  Protein:  125-145 grams  Fluid:  2-2.2 L/day  EDUCATION NEEDS:   No education needs identified at this time  Amy Parker, MS, RD, LDN Pager # [phone] After hours/ weekend pager # [phone]

## 2016-07-30 NOTE — Progress Notes (Signed)
Physical Therapy Treatment Patient Details Name: Amy [name] MRN: [id number] DOB: Feb 06, 1946 Today's Date: 07/30/2016    History of Present Illness Patient is a 70 yo female admitted 07/20/16 with acute respiratory failure with hypoxia, dehissance of abdominal wound with infection, metabolic encephalopathy.    PMHx:  SBO with lysis of adhesions, DOE, PVD, neuropathy, HTN, h/o DVT, fibromyalgia, and chronic back pain (s/p multiple back surgeries).     PT Comments    The pt is making progress toward goals, but she is limited by pain.  Pt tolerated some therapeutic exercise in the bed, but then requested pain medicine before gait training.  Notified nurse and will return later for gait training.  Continue with POC and check back later.  Follow Up Recommendations  SNF;Supervision/Assistance - 24 hour     Equipment Recommendations  None recommended by PT    Recommendations for Other Services       Precautions / Restrictions Precautions Precautions: Fall Precaution Comments: Open wound abdomen Restrictions Weight Bearing Restrictions: No    Mobility  Bed Mobility Overal bed mobility: Modified Independent Bed Mobility: Supine to Sit     Supine to sit: Modified independent (Device/Increase time)     General bed mobility comments: use of rails and increased time due to pain  Transfers Overall transfer level: Needs assistance Equipment used: Rolling walker (2 wheeled) Transfers: Sit to/from Stand Sit to Stand: Min guard         General transfer comment: sit to stand x 2/from bed and toilet  Ambulation/Gait Ambulation/Gait assistance: Min guard Ambulation Distance (Feet): 10 Feet (one additional trial of 10) Assistive device: Rolling walker (2 wheeled) Gait Pattern/deviations: Step-through pattern;Decreased stride length;Antalgic Gait velocity: slow Gait velocity interpretation: Below normal speed for age/gender General Gait Details: very slow due to  pain   Stairs            Wheelchair Mobility    Modified Rankin (Stroke Patients Only)       Balance Overall balance assessment: Independent Sitting-balance support: Feet supported Sitting balance-Leahy Scale: Good     Standing balance support: Bilateral upper extremity supported Standing balance-Leahy Scale: Poor                      Cognition Arousal/Alertness: Awake/alert Behavior During Therapy: WFL for tasks assessed/performed Overall Cognitive Status: Within Functional Limits for tasks assessed                      Exercises Total Joint Exercises Ankle Circles/Pumps: AROM;Both;10 reps;Supine Short Arc Quad: AROM;Both;10 reps;Supine    General Comments        Pertinent Vitals/Pain Pain Assessment: 0-10 Pain Score: 6  Pain Location: abdomen Pain Descriptors / Indicators: Sore;Grimacing;Guarding Pain Intervention(s): Limited activity within patient's tolerance;Monitored during session;Repositioned;Patient requesting pain meds-RN notified    Home Living                      Prior Function            PT Goals (current goals can now be found in the care plan section) Acute Rehab PT Goals Patient Stated Goal: pt wants to decrease pain PT Goal Formulation: With patient Time For Goal Achievement: 07/29/16 Potential to Achieve Goals: Good Progress towards PT goals: Progressing toward goals    Frequency    Min 3X/week      PT Plan Current plan remains appropriate    Co-evaluation  End of Session Equipment Utilized During Treatment: Gait belt Activity Tolerance: Patient limited by pain Patient left: in chair;with call bell/phone within reach     Time: 1015-1046 PT Time Calculation (min) (ACUTE ONLY): 31 min  Charges:  $Therapeutic Activity: 23-37 mins                    G Codes:      Bary Castilla August 24, 2016, 11:24 AM Rito Ehrlich. Nida Boatman Pager: [phone]

## 2016-07-30 NOTE — Consult Note (Signed)
   [facility] CM Inpatient Consult   07/30/2016  Amy [name] 1946-03-14 [id number]   [facility] Care Management screening follow up. Chart reviewed. It appears current discharge plan remains to be SNF. There are no identifiable [facility] Care Management needs at this time. If patient's post hospital needs change, please place a [facility] Care Management consult. For questions please contact:  Marthenia Rolling, [place], RN,BSN [facility] Liaison [phone]

## 2016-07-30 NOTE — Progress Notes (Signed)
Patient ID: Amy [name], female   DOB: 03-18-1946, 70 y.o.   MRN: [id number]  [facility] Surgery Progress Note     Subjective: Tolerating diet although she does have a suppressed appetite. +flatus and BM this AM Denies any increased abdominal pain. Complaining of itching at inferior aspect of wound.  Objective: Vital signs in last 24 hours: Temp:  [97.5 F (36.4 C)-98.6 F (37 C)] 97.5 F (36.4 C) (11/06 0900) Pulse Rate:  [62-68] 62 (11/05 2113) Resp:  [18] 18 (11/05 2113) BP: (169-185)/(76-80) 185/80 (11/05 2113) SpO2:  [97 %-98 %] 97 % (11/05 2113) Weight:  [282 lb 13.6 oz (128.3 kg)] 282 lb 13.6 oz (128.3 kg) (11/05 2113) Last BM Date: 07/27/16  Intake/Output from previous day: 11/05 0701 - 11/06 0700 In: 1080 [P.O.:1080] Out: 2350 [Urine:2350] Intake/Output this shift: No intake/output data recorded.  PE: Gen:  Alert, NAD, pleasant Pulm:  Effort normal Abd: Soft, NT/ND, +BS, midline abdominal incision with beefy red tissue around edges and no active drainage, she does have some erythema at distal aspect of incision and under panus     Lab Results:   Recent Labs  07/28/16 0500  WBC 5.6  HGB 9.8*  HCT 30.2*  PLT 416*   BMET  Recent Labs  07/28/16 0500  NA 138  K 3.8  CL 101  CO2 29  GLUCOSE 96  BUN 9  CREATININE 0.69  CALCIUM 8.5*   PT/INR No results for input(s): LABPROT, INR in the last 72 hours. CMP     Component Value Date/Time   NA 138 07/28/2016 0500   NA 140 02/14/2016   K 3.8 07/28/2016 0500   CL 101 07/28/2016 0500   CO2 29 07/28/2016 0500   GLUCOSE 96 07/28/2016 0500   BUN 9 07/28/2016 0500   BUN 13 02/14/2016   CREATININE 0.69 07/28/2016 0500   CALCIUM 8.5 (L) 07/28/2016 0500   PROT 5.7 (L) 07/26/2016 0540   ALBUMIN 2.2 (L) 07/26/2016 0540   AST 23 07/26/2016 0540   ALT 17 07/26/2016 0540   ALKPHOS 83 07/26/2016 0540   BILITOT 0.3 07/26/2016 0540   GFRNONAA >60 07/28/2016 0500   GFRAA >60 07/28/2016  0500   Lipase     Component Value Date/Time   LIPASE 61 (H) 07/04/2016 0318       Studies/Results: No results found.  Anti-infectives: Anti-infectives    Start     Dose/Rate Route Frequency Ordered Stop   07/24/16 1400  cefTRIAXone (ROCEPHIN) 2 g in dextrose 5 % 50 mL IVPB     2 g 100 mL/hr over 30 Minutes Intravenous Every 24 hours 07/24/16 1219     07/22/16 1400  meropenem (MERREM) 1 g in sodium chloride 0.9 % 100 mL IVPB  Status:  Discontinued     1 g 200 mL/hr over 30 Minutes Intravenous Every 8 hours 07/22/16 1338 07/24/16 1218   07/21/16 1300  vancomycin (VANCOCIN) 1,250 mg in sodium chloride 0.9 % 250 mL IVPB  Status:  Discontinued     1,250 mg 166.7 mL/hr over 90 Minutes Intravenous Every 12 hours 07/21/16 1028 07/24/16 1218   07/21/16 1030  aztreonam (AZACTAM) 1 g in dextrose 5 % 50 mL IVPB  Status:  Discontinued     1 g 100 mL/hr over 30 Minutes Intravenous Every 8 hours 07/21/16 1028 07/22/16 1337   07/21/16 1000  vancomycin (VANCOCIN) 1,250 mg in sodium chloride 0.9 % 250 mL IVPB  Status:  Discontinued  1,250 mg 166.7 mL/hr over 90 Minutes Intravenous Every 12 hours 07/21/16 0613 07/21/16 1028   07/21/16 1000  aztreonam (AZACTAM) 1 g in dextrose 5 % 50 mL IVPB  Status:  Discontinued     1 g 100 mL/hr over 30 Minutes Intravenous Every 8 hours 07/21/16 0613 07/21/16 1028   07/21/16 0800  metroNIDAZOLE (FLAGYL) IVPB 500 mg  Status:  Discontinued     500 mg 100 mL/hr over 60 Minutes Intravenous Every 8 hours 07/21/16 0613 07/22/16 1337   07/20/16 2345  aztreonam (AZACTAM) 2 g in dextrose 5 % 50 mL IVPB     2 g 100 mL/hr over 30 Minutes Intravenous  Once 07/20/16 2343 07/21/16 0108   07/20/16 2345  metroNIDAZOLE (FLAGYL) IVPB 500 mg     500 mg 100 mL/hr over 60 Minutes Intravenous  Once 07/20/16 2343 07/21/16 0144   07/20/16 2345  vancomycin (VANCOCIN) IVPB 1000 mg/200 mL premix     1,000 mg 200 mL/hr over 60 Minutes Intravenous  Once 07/20/16 2343 07/21/16  0456     Susceptibility    Escherichia coli    MIC    AMPICILLIN >=32 RESIST... Resistant    AMPICILLIN/SULBACTAM 16 INTERMED... Intermediate    CEFAZOLIN <=4 SENSITIVE "><=4 SENSITIVE  Sensitive    CEFEPIME <=1 SENSITIVE "><=1 SENSITIVE  Sensitive    CEFTAZIDIME <=1 SENSITIVE "><=1 SENSITIVE  Sensitive    CEFTRIAXONE <=1 SENSITIVE "><=1 SENSITIVE  Sensitive    CIPROFLOXACIN 0.5 SENSITIVE  Sensitive    Extended ESBL NEGATIVE  Sensitive    GENTAMICIN >=16 RESIST... Resistant    IMIPENEM <=0.25 SENSITIVE "><=0.25 SENS... Sensitive    PIP/TAZO <=4 SENSITIVE "><=4 SENSITIVE  Sensitive    TRIMETH/SULFA >=320 RESIS... Resistant      Assessment/Plan Abdominal wound infection - S/p diagnostic laparoscopy emergent coversion to open with LOA x 3 hours on 10/13, Dr. Greer Pickerel - No evidence of fistula at this time - CT 10/28 showed midline anterior abdominal wound with air within the wound track and surrounding mild inflammatory changes concerning for cellulitis, no drainable fluid collection - Wound was opened down to the fascia 10/28, fascia partially dehisced  Anemia  HTN - clonidine 0.1 TID, metoprolol 50 BID Chronic pain - tramadol  GERD OSA - not on bi-pap Depression - paxil  ID: rocephin 10/31>> VTE: SCD's, Lovenox per pharmacy FEN: heart healthy  Plan: on rocephin day 7. continue BID wet-to-dry dressing changes. Decrease dilaudid to q4 and start to transition back to PO pain medication. Does not appear to be draining at this time or be any concern for fistula, I do not think that CT is needed but will discuss with MD. Add InterDry to decrease irritation/itching under skin fold. Plans to return to SNF upon discharge   LOS: 8 days    Jerrye Beavers , [facility] Surgery 07/30/2016, 10:04 AM Pager: [phone] Consults: [phone] Mon-Fri 7:00 am-4:30 pm Sat-Sun 7:00 am-11:30 am

## 2016-07-30 NOTE — Progress Notes (Signed)
Delayed entry from 11/5.    Subjective: Pt continues to do well.  Tolerating diet.  There were questions of fistula at wound.    Objective: Vital signs in last 24 hours: Temp:  [98 F (36.7 C)-98.6 F (37 C)] 98.6 F (37 C) (11/05 2113) Pulse Rate:  [62-68] 62 (11/05 2113) Resp:  [18] 18 (11/05 2113) BP: (143-185)/(68-80) 185/80 (11/05 2113) SpO2:  [97 %-100 %] 97 % (11/05 2113) Weight:  [128.3 kg (282 lb 13.6 oz)] 128.3 kg (282 lb 13.6 oz) (11/05 2113) Last BM Date: 07/27/16  Intake/Output from previous day: 11/05 0701 - 11/06 0700 In: 720 [P.O.:720] Out: 1150 [Urine:1150] Intake/Output this shift: No intake/output data recorded.  General appearance: alert and cooperative GI: soft, non-tender; non distended. No bilious material coming from wound.  No signs of wound dehiscence. Beefy red granulation tissue at wound.    Lab Results:   Recent Labs  07/28/16 0500  WBC 5.6  HGB 9.8*  HCT 30.2*  PLT 416*   BMET  Recent Labs  07/28/16 0500  NA 138  K 3.8  CL 101  CO2 29  GLUCOSE 96  BUN 9  CREATININE 0.69  CALCIUM 8.5*   PT/INR No results for input(s): LABPROT, INR in the last 72 hours. ABG No results for input(s): PHART, HCO3 in the last 72 hours.  Invalid input(s): PCO2, PO2  Studies/Results: No results found.  Anti-infectives: Anti-infectives    Start     Dose/Rate Route Frequency Ordered Stop   07/24/16 1400  cefTRIAXone (ROCEPHIN) 2 g in dextrose 5 % 50 mL IVPB     2 g 100 mL/hr over 30 Minutes Intravenous Every 24 hours 07/24/16 1219     07/22/16 1400  meropenem (MERREM) 1 g in sodium chloride 0.9 % 100 mL IVPB  Status:  Discontinued     1 g 200 mL/hr over 30 Minutes Intravenous Every 8 hours 07/22/16 1338 07/24/16 1218   07/21/16 1300  vancomycin (VANCOCIN) 1,250 mg in sodium chloride 0.9 % 250 mL IVPB  Status:  Discontinued     1,250 mg 166.7 mL/hr over 90 Minutes Intravenous Every 12 hours 07/21/16 1028 07/24/16 1218   07/21/16 1030   aztreonam (AZACTAM) 1 g in dextrose 5 % 50 mL IVPB  Status:  Discontinued     1 g 100 mL/hr over 30 Minutes Intravenous Every 8 hours 07/21/16 1028 07/22/16 1337   07/21/16 1000  vancomycin (VANCOCIN) 1,250 mg in sodium chloride 0.9 % 250 mL IVPB  Status:  Discontinued     1,250 mg 166.7 mL/hr over 90 Minutes Intravenous Every 12 hours 07/21/16 0613 07/21/16 1028   07/21/16 1000  aztreonam (AZACTAM) 1 g in dextrose 5 % 50 mL IVPB  Status:  Discontinued     1 g 100 mL/hr over 30 Minutes Intravenous Every 8 hours 07/21/16 0613 07/21/16 1028   07/21/16 0800  metroNIDAZOLE (FLAGYL) IVPB 500 mg  Status:  Discontinued     500 mg 100 mL/hr over 60 Minutes Intravenous Every 8 hours 07/21/16 0613 07/22/16 1337   07/20/16 2345  aztreonam (AZACTAM) 2 g in dextrose 5 % 50 mL IVPB     2 g 100 mL/hr over 30 Minutes Intravenous  Once 07/20/16 2343 07/21/16 0108   07/20/16 2345  metroNIDAZOLE (FLAGYL) IVPB 500 mg     500 mg 100 mL/hr over 60 Minutes Intravenous  Once 07/20/16 2343 07/21/16 0144   07/20/16 2345  vancomycin (VANCOCIN) IVPB 1000 mg/200 mL premix  1,000 mg 200 mL/hr over 60 Minutes Intravenous  Once 07/20/16 2343 07/21/16 0456      Assessment/Plan: Abdominal wound infection S/p diagnostic laparoscopy emergent coversion to open with LOA x 3 hours on 10/13, Dr. Greer Pickerel No evidence of fistula at this time  Anemia  HTN - clonidine 0.1 TID, metoprolol 50 BID Chronic pain - tramadol  GERD OSA - no on bi-pap VTE: SCD's, Lovenox per pharmacy  Plan: continue BID wet-to-dry dressing changes, advance to regdiet Hopefully home in next 1-2d Will need to learn to pack wound prior to DC home   LOS: 8 days    [facility] 07/30/2016

## 2016-07-30 NOTE — Consult Note (Signed)
[place] Nurse wound consult note Reason for Consult: re-evaluate abdominal wound PA concerned about cellulitis distal end of wound.   Wound type:intretriginous dermatitis Noted under pannus, worse on the left side and onto left thigh, moist, but not open. At distal edge of wound but with presentation in the groin and the thigh feel this is more related to candida overgrowth. Dressing procedure/placement/frequency: Will add InterDry Ag+ under the pannus on both sides for ITD, and I have suggested PO Diflucan to treat systemically.   Discussed POC with patient and bedside nurse.  Re consult if needed, will not follow at this time. Thanks  Melody [employer], RN,CWOCN, CNS [phone])

## 2016-07-30 NOTE — Care Management Important Message (Signed)
Important Message  Patient Details  Name: Amy [name] MRN: [id number] Date of Birth: Dec 25, 1945   Medicare Important Message Given:  Yes    Iris Bratton 07/30/2016, 11:18 AM

## 2016-07-30 NOTE — Progress Notes (Signed)
Physical Therapy Treatment Patient Details Name: Amy [name] MRN: [id number] DOB: [birth year] Today's Date: 07/30/2016    History of Present Illness Patient is a 70 yo female admitted 07/20/16 with acute respiratory failure with hypoxia, dehissance of abdominal wound with infection, metabolic encephalopathy.    PMHx:  SBO with lysis of adhesions, DOE, PVD, neuropathy, HTN, h/o DVT, fibromyalgia, and chronic back pain (s/p multiple back surgeries).     PT Comments    The pt is requiring less assistance with bed mobility and transfers, but still cannot ambulate very far due to pain in her abdomen and lower back.  Pt was medicated prior to treatment, but the pain medicine made her feel dizzy.  Was only able to ambulate to the door of her room and back.  Pt would benefit from further PT to increase her strength and endurance.  Continue with POC and gait train next session.  Follow Up Recommendations  SNF;Supervision/Assistance - 24 hour     Equipment Recommendations  None recommended by PT    Recommendations for Other Services       Precautions / Restrictions Precautions Precautions: Fall Precaution Comments: Open wound abdomen Restrictions Weight Bearing Restrictions: No    Mobility  Bed Mobility         General bed mobility comments: pt OOB in chair   Transfers Overall transfer level: Needs assistance Equipment used: Rolling walker (2 wheeled) Transfers: Sit to/from Stand Sit to Stand: Supervision         General transfer comment:  (no assistance required but pt moves extremely slow due to pain)  Ambulation/Gait Ambulation/Gait assistance: Supervision Ambulation Distance (Feet): 15 Feet Assistive device: Rolling walker (2 wheeled) Gait Pattern/deviations: Step-through pattern;Decreased stride length;Antalgic Gait velocity: slow Gait velocity interpretation: Below normal speed for age/gender General Gait Details: very slow due to pain.  pt reported not  feeling well and a little dizzy after pain meds   Stairs            Wheelchair Mobility    Modified Rankin (Stroke Patients Only)       Balance Overall balance assessment: Independent Sitting-balance support: Feet supported Sitting balance-Leahy Scale: Good     Standing balance support: Bilateral upper extremity supported Standing balance-Leahy Scale: Poor                      Cognition Arousal/Alertness: Suspect due to medications Behavior During Therapy: WFL for tasks assessed/performed Overall Cognitive Status: Within Functional Limits for tasks assessed                      Exercises Total Joint Exercises Ankle Circles/Pumps: AROM;Both;10 reps;Supine Short Arc Quad: AROM;Both;10 reps;Supine    General Comments        Pertinent Vitals/Pain Pain Assessment: 0-10 Pain Score: 6  Pain Location: abdomen and lower back Pain Descriptors / Indicators: Grimacing;Aching;Guarding;Sore Pain Intervention(s): Limited activity within patient's tolerance;Monitored during session;Premedicated before session    Home Living                      Prior Function            PT Goals (current goals can now be found in the care plan section) Acute Rehab PT Goals Patient Stated Goal: pt wants to decrease pain PT Goal Formulation: With patient Time For Goal Achievement: 07/29/16 Potential to Achieve Goals: Good Progress towards PT goals: Progressing toward goals    Frequency    Min  3X/week      PT Plan Current plan remains appropriate    Co-evaluation             End of Session Equipment Utilized During Treatment: Gait belt Activity Tolerance: Patient limited by pain Patient left: in chair;with call bell/phone within reach;with family/visitor present     Time: [id number] PT Time Calculation (min) (ACUTE ONLY): 23 min  Charges:  2 gait                   G Codes:      Bary Castilla 08-22-16, 2:39 PM  Rito Ehrlich. Nida Boatman Pager: [phone]

## 2016-07-31 LAB — GLUCOSE, CAPILLARY
Glucose-Capillary: 110 mg/dL — ABNORMAL HIGH (ref 65–99)
Glucose-Capillary: 112 mg/dL — ABNORMAL HIGH (ref 65–99)
Glucose-Capillary: 112 mg/dL — ABNORMAL HIGH (ref 65–99)
Glucose-Capillary: 122 mg/dL — ABNORMAL HIGH (ref 65–99)

## 2016-07-31 MED ORDER — HYDROCODONE-ACETAMINOPHEN 5-325 MG PO TABS
1.0000 | ORAL_TABLET | ORAL | Status: DC | PRN
  Administered 2016-07-31 (×2): 2 via ORAL
  Administered 2016-08-01: 1 via ORAL
  Administered 2016-08-01 (×2): 2 via ORAL
  Filled 2016-07-31 (×5): qty 2

## 2016-07-31 MED ORDER — DEXTROSE 5 % IV SOLN
2.0000 g | INTRAVENOUS | Status: DC
  Administered 2016-07-31: 2 g via INTRAVENOUS
  Filled 2016-07-31 (×2): qty 2

## 2016-07-31 MED ORDER — ALTEPLASE 2 MG IJ SOLR
2.0000 mg | Freq: Once | INTRAMUSCULAR | Status: AC
  Administered 2016-07-31: 2 mg
  Filled 2016-07-31: qty 2

## 2016-07-31 MED ORDER — ALTEPLASE 2 MG IJ SOLR
2.0000 mg | Freq: Once | INTRAMUSCULAR | Status: AC
  Administered 2016-07-31: 2 mg

## 2016-07-31 MED ORDER — HYDROMORPHONE HCL 1 MG/ML IJ SOLN
0.2000 mg | INTRAMUSCULAR | Status: DC | PRN
Start: 2016-07-31 — End: 2016-08-01

## 2016-07-31 MED ORDER — ACETAMINOPHEN 500 MG PO TABS: 500.0000 mg | ORAL_TABLET | Freq: Three times a day (TID) | ORAL | Status: DC | PRN

## 2016-07-31 NOTE — Discharge Instructions (Signed)
MIDLINE WOUND CARE: - midline dressing to be changed twice daily - supplies: sterile saline, kerlix, scissors, ABD pads, tape  - remove dressing and all packing carefully, moistening with sterile saline as needed to avoid packing/internal dressing sticking to the wound. - clean edges of skin around the wound with water/gauze, making sure there is no tape debris or leakage left on skin that could cause skin irritation or breakdown. - dampen a clean kerlix with sterile saline and pack wound from wound base to skin level, making sure to take note of any possible areas of wound tracking, tunneling and packing appropriately. Wound can be packed loosely. Trim kerlix to size if a whole kerlix is not required. - cover wound with a dry ABD pad and secure with tape.  - write the date/time on the dry dressing/tape to better track when the last dressing change occurred. - apply any skin protectant/powder recommended by clinician to protect skin/skin folds. - change dressing as needed if leakage occurs, wound gets contaminated, or patient requests to shower. - patient may shower daily with wound open and following the shower the wound should be dried and a clean dressing placed.

## 2016-07-31 NOTE — Progress Notes (Signed)
[facility] Progress Note     Subjective: Feels better than yesterday, still mild pain and itching around incision site. Tolerating PO, does have decreased appetitie. Having bowel movements and denies nausea/vomiting. Patient requests for the skin around her wound to be cleaned during dressing changes.  Concerned about wound care - especially when it comes to transitioning from hospital to SNF. I told the patient general surgery would be sure to provide specific wound care instructions for nursing staff at discharge.  Objective: Vital signs in last 24 hours: Temp:  [98.2 F (36.8 C)-98.6 F (37 C)] 98.2 F (36.8 C) (11/07 1000) Pulse Rate:  [66-83] 83 (11/07 1000) Resp:  [16-18] 18 (11/07 1000) BP: (144-185)/(61-88) 144/61 (11/07 1000) SpO2:  [93 %-98 %] 98 % (11/07 1000) Weight:  [282 lb 3 oz (128 kg)] 282 lb 3 oz (128 kg) (11/07 0513) Last BM Date: 07/30/16  Intake/Output from previous day: 11/06 0701 - 11/07 0700 In: 720 [P.O.:720] Out: 750 [Urine:750] Intake/Output this shift: Total I/O In: 120 [P.O.:120] Out: 650 [Urine:650]  PE: Gen:  Alert, NAD, pleasant Pulm:  Effort normal Abd: Soft, NT/ND, +BS, midline abdominal incision with beefy red tissue around edges and no active drainage, she does have some erythema at distal aspect of incision and under panus - stable compared to yesterday  CMP     Component Value Date/Time   NA 138 07/28/2016 0500   NA 140 02/14/2016   K 3.8 07/28/2016 0500   CL 101 07/28/2016 0500   CO2 29 07/28/2016 0500   GLUCOSE 96 07/28/2016 0500   BUN 9 07/28/2016 0500   BUN 13 02/14/2016   CREATININE 0.69 07/28/2016 0500   CALCIUM 8.5 (L) 07/28/2016 0500   PROT 5.7 (L) 07/26/2016 0540   ALBUMIN 2.2 (L) 07/26/2016 0540   AST 23 07/26/2016 0540   ALT 17 07/26/2016 0540   ALKPHOS 83 07/26/2016 0540   BILITOT 0.3 07/26/2016 0540   GFRNONAA >60 07/28/2016 0500   GFRAA >60 07/28/2016 0500   Lipase     Component Value  Date/Time   LIPASE 61 (H) 07/04/2016 0318   Anti-infectives: Anti-infectives    Start     Dose/Rate Route Frequency Ordered Stop   08/02/16 1345  fluconazole (DIFLUCAN) tablet 150 mg     150 mg Oral  Once 07/30/16 1342     07/30/16 1345  fluconazole (DIFLUCAN) tablet 150 mg     150 mg Oral  Once 07/30/16 1342 07/30/16 1454   07/24/16 1400  cefTRIAXone (ROCEPHIN) 2 g in dextrose 5 % 50 mL IVPB     2 g 100 mL/hr over 30 Minutes Intravenous Every 24 hours 07/24/16 1219     07/22/16 1400  meropenem (MERREM) 1 g in sodium chloride 0.9 % 100 mL IVPB  Status:  Discontinued     1 g 200 mL/hr over 30 Minutes Intravenous Every 8 hours 07/22/16 1338 07/24/16 1218   07/21/16 1300  vancomycin (VANCOCIN) 1,250 mg in sodium chloride 0.9 % 250 mL IVPB  Status:  Discontinued     1,250 mg 166.7 mL/hr over 90 Minutes Intravenous Every 12 hours 07/21/16 1028 07/24/16 1218   07/21/16 1030  aztreonam (AZACTAM) 1 g in dextrose 5 % 50 mL IVPB  Status:  Discontinued     1 g 100 mL/hr over 30 Minutes Intravenous Every 8 hours 07/21/16 1028 07/22/16 1337   07/21/16 1000  vancomycin (VANCOCIN) 1,250 mg in sodium chloride 0.9 % 250 mL IVPB  Status:  Discontinued     1,250 mg 166.7 mL/hr over 90 Minutes Intravenous Every 12 hours 07/21/16 0613 07/21/16 1028   07/21/16 1000  aztreonam (AZACTAM) 1 g in dextrose 5 % 50 mL IVPB  Status:  Discontinued     1 g 100 mL/hr over 30 Minutes Intravenous Every 8 hours 07/21/16 0613 07/21/16 1028   07/21/16 0800  metroNIDAZOLE (FLAGYL) IVPB 500 mg  Status:  Discontinued     500 mg 100 mL/hr over 60 Minutes Intravenous Every 8 hours 07/21/16 0613 07/22/16 1337   07/20/16 2345  aztreonam (AZACTAM) 2 g in dextrose 5 % 50 mL IVPB     2 g 100 mL/hr over 30 Minutes Intravenous  Once 07/20/16 2343 07/21/16 0108   07/20/16 2345  metroNIDAZOLE (FLAGYL) IVPB 500 mg     500 mg 100 mL/hr over 60 Minutes Intravenous  Once 07/20/16 2343 07/21/16 0144   07/20/16 2345  vancomycin  (VANCOCIN) IVPB 1000 mg/200 mL premix     1,000 mg 200 mL/hr over 60 Minutes Intravenous  Once 07/20/16 2343 07/21/16 0456     Assessment/Plan Abdominal wound infection - S/p diagnostic laparoscopy emergent coversion to open with LOA x 3 hours on 10/13, Dr. Greer Pickerel - CT 10/28 showed midline anterior abdominal wound with air within the wound track and surrounding mild inflammatory changes concerning for cellulitis, no drainable fluid collection - Wound was opened down to the fascia 10/28, fascia partially dehisced  -  No evidence of fistula at this time - WBC WNL  - patient requesting specific wound care instructions at discharge - placed in d/c info.  Anemia HTN - clonidine 0.1 TID, metoprolol 50 BID Chronic pain GERD OSA Depression - Paxil   ID: rocephin 10/31>> (day #8) VTE: SCD's, Lovenox per pharmacy FEN: heart healthy  Plan: continue BID wet-to-dry dressing changes, continue antibiotics for 10 total day (end date 08/02/16) Pain control: d/c morphine; hydrocodone and ultram for pain control; dilaudid for breakthrough only    LOS: 9 days    Jill Alexanders , [facility] 07/31/2016, 11:50 AM Pager: [phone] Consults: [phone] Mon-Fri 7:00 am-4:30 pm Sat-Sun 7:00 am-11:30 am

## 2016-08-01 DIAGNOSIS — Z872 Personal history of diseases of the skin and subcutaneous tissue: Secondary | ICD-10-CM | POA: Diagnosis not present

## 2016-08-01 DIAGNOSIS — R262 Difficulty in walking, not elsewhere classified: Secondary | ICD-10-CM | POA: Diagnosis not present

## 2016-08-01 DIAGNOSIS — I10 Essential (primary) hypertension: Secondary | ICD-10-CM | POA: Diagnosis not present

## 2016-08-01 DIAGNOSIS — F329 Major depressive disorder, single episode, unspecified: Secondary | ICD-10-CM | POA: Diagnosis not present

## 2016-08-01 DIAGNOSIS — R1031 Right lower quadrant pain: Secondary | ICD-10-CM | POA: Diagnosis not present

## 2016-08-01 DIAGNOSIS — T8131XD Disruption of external operation (surgical) wound, not elsewhere classified, subsequent encounter: Secondary | ICD-10-CM | POA: Diagnosis not present

## 2016-08-01 DIAGNOSIS — E440 Moderate protein-calorie malnutrition: Secondary | ICD-10-CM | POA: Diagnosis not present

## 2016-08-01 DIAGNOSIS — J3089 Other allergic rhinitis: Secondary | ICD-10-CM | POA: Diagnosis not present

## 2016-08-01 DIAGNOSIS — M545 Low back pain: Secondary | ICD-10-CM | POA: Diagnosis not present

## 2016-08-01 DIAGNOSIS — T814XXD Infection following a procedure, subsequent encounter: Secondary | ICD-10-CM | POA: Diagnosis not present

## 2016-08-01 DIAGNOSIS — R278 Other lack of coordination: Secondary | ICD-10-CM | POA: Diagnosis not present

## 2016-08-01 DIAGNOSIS — K219 Gastro-esophageal reflux disease without esophagitis: Secondary | ICD-10-CM | POA: Diagnosis not present

## 2016-08-01 DIAGNOSIS — Z48815 Encounter for surgical aftercare following surgery on the digestive system: Secondary | ICD-10-CM | POA: Diagnosis not present

## 2016-08-01 DIAGNOSIS — G5792 Unspecified mononeuropathy of left lower limb: Secondary | ICD-10-CM | POA: Diagnosis not present

## 2016-08-01 DIAGNOSIS — G5791 Unspecified mononeuropathy of right lower limb: Secondary | ICD-10-CM | POA: Diagnosis not present

## 2016-08-01 DIAGNOSIS — M792 Neuralgia and neuritis, unspecified: Secondary | ICD-10-CM | POA: Diagnosis not present

## 2016-08-01 DIAGNOSIS — L740 Miliaria rubra: Secondary | ICD-10-CM | POA: Diagnosis not present

## 2016-08-01 DIAGNOSIS — M961 Postlaminectomy syndrome, not elsewhere classified: Secondary | ICD-10-CM | POA: Diagnosis not present

## 2016-08-01 DIAGNOSIS — G8929 Other chronic pain: Secondary | ICD-10-CM | POA: Diagnosis not present

## 2016-08-01 DIAGNOSIS — R1312 Dysphagia, oropharyngeal phase: Secondary | ICD-10-CM | POA: Diagnosis not present

## 2016-08-01 DIAGNOSIS — D62 Acute posthemorrhagic anemia: Secondary | ICD-10-CM | POA: Diagnosis not present

## 2016-08-01 DIAGNOSIS — K56609 Unspecified intestinal obstruction, unspecified as to partial versus complete obstruction: Secondary | ICD-10-CM | POA: Diagnosis not present

## 2016-08-01 DIAGNOSIS — Z789 Other specified health status: Secondary | ICD-10-CM | POA: Diagnosis not present

## 2016-08-01 DIAGNOSIS — M6281 Muscle weakness (generalized): Secondary | ICD-10-CM | POA: Diagnosis not present

## 2016-08-01 DIAGNOSIS — R5381 Other malaise: Secondary | ICD-10-CM | POA: Diagnosis not present

## 2016-08-01 DIAGNOSIS — M5441 Lumbago with sciatica, right side: Secondary | ICD-10-CM | POA: Diagnosis not present

## 2016-08-01 DIAGNOSIS — G4733 Obstructive sleep apnea (adult) (pediatric): Secondary | ICD-10-CM | POA: Diagnosis not present

## 2016-08-01 DIAGNOSIS — K5901 Slow transit constipation: Secondary | ICD-10-CM | POA: Diagnosis not present

## 2016-08-01 DIAGNOSIS — R1032 Left lower quadrant pain: Secondary | ICD-10-CM | POA: Diagnosis not present

## 2016-08-01 LAB — BASIC METABOLIC PANEL
Anion gap: 8 (ref 5–15)
BUN: 7 mg/dL (ref 6–20)
CO2: 30 mmol/L (ref 22–32)
Calcium: 8.9 mg/dL (ref 8.9–10.3)
Chloride: 100 mmol/L — ABNORMAL LOW (ref 101–111)
Creatinine, Ser: 0.72 mg/dL (ref 0.44–1.00)
GFR calc Af Amer: >60 mL/min (ref >60)
GFR calc non Af Amer: >60 mL/min (ref >60)
Glucose, Bld: 109 mg/dL — ABNORMAL HIGH (ref 65–99)
Potassium: 3.4 mmol/L — ABNORMAL LOW (ref 3.5–5.1)
Sodium: 138 mmol/L (ref 135–145)

## 2016-08-01 LAB — GLUCOSE, CAPILLARY
Glucose-Capillary: 112 mg/dL — ABNORMAL HIGH (ref 65–99)
Glucose-Capillary: 100 mg/dL — ABNORMAL HIGH (ref 65–99)
Glucose-Capillary: 100 mg/dL — ABNORMAL HIGH (ref 65–99)
Glucose-Capillary: 113 mg/dL — ABNORMAL HIGH (ref 65–99)

## 2016-08-01 LAB — CBC
HCT: 32.2 % — ABNORMAL LOW (ref 36.0–46.0)
Hemoglobin: 10.6 g/dL — ABNORMAL LOW (ref 12.0–15.0)
MCH: 30.1 pg (ref 26.0–34.0)
MCHC: 32.9 g/dL (ref 30.0–36.0)
MCV: 91.5 fL (ref 78.0–100.0)
Platelets: 314 10*3/uL (ref 150–400)
RBC: 3.52 MIL/uL — ABNORMAL LOW (ref 3.87–5.11)
RDW: 15.9 % — ABNORMAL HIGH (ref 11.5–15.5)
WBC: 5.9 10*3/uL (ref 4.0–10.5)

## 2016-08-01 MED ORDER — POLYETHYLENE GLYCOL 3350 17 G PO PACK: 17.0000 g | PACK | Freq: Every day | ORAL | 0 refills | Status: DC

## 2016-08-01 MED ORDER — HYDROCODONE-ACETAMINOPHEN 5-325 MG PO TABS: 1.0000 | ORAL_TABLET | ORAL | 0 refills | Status: DC | PRN

## 2016-08-01 MED ORDER — SENNOSIDES-DOCUSATE SODIUM 8.6-50 MG PO TABS: 2.0000 | ORAL_TABLET | Freq: Two times a day (BID) | ORAL | Status: DC

## 2016-08-01 MED ORDER — L-METHYLFOLATE-B6-B12 3-35-2 MG PO TABS: 1.0000 | ORAL_TABLET | Freq: Two times a day (BID) | ORAL | Status: DC

## 2016-08-01 MED ORDER — METOPROLOL TARTRATE 50 MG PO TABS: 50.0000 mg | ORAL_TABLET | Freq: Two times a day (BID) | ORAL | Status: DC

## 2016-08-01 MED ORDER — TRAMADOL HCL 50 MG PO TABS
50.0000 mg | ORAL_TABLET | Freq: Four times a day (QID) | ORAL | Status: DC | PRN
Start: 2016-08-01 — End: 2016-08-02

## 2016-08-01 MED ORDER — CLONIDINE HCL 0.1 MG PO TABS: 0.1000 mg | ORAL_TABLET | Freq: Three times a day (TID) | ORAL | 11 refills | Status: DC

## 2016-08-01 MED ORDER — ENSURE ENLIVE PO LIQD: 237.0000 mL | Freq: Two times a day (BID) | ORAL | 12 refills | Status: DC

## 2016-08-01 MED ORDER — ESTRADIOL 0.1 MG/GM VA CREA: 1.0000 | TOPICAL_CREAM | VAGINAL | 12 refills | Status: AC

## 2016-08-01 MED ORDER — FLUTICASONE PROPIONATE 50 MCG/ACT NA SUSP: 1.0000 | Freq: Every day | NASAL | 2 refills | Status: AC

## 2016-08-01 MED ORDER — CALCIUM CARBONATE-VITAMIN D 500-200 MG-UNIT PO TABS: 1.0000 | ORAL_TABLET | Freq: Every day | ORAL | Status: DC

## 2016-08-01 MED ORDER — CIPROFLOXACIN HCL 500 MG PO TABS: 500.0000 mg | ORAL_TABLET | Freq: Two times a day (BID) | ORAL | Status: AC

## 2016-08-01 MED ORDER — CIPROFLOXACIN HCL 500 MG PO TABS
500.0000 mg | ORAL_TABLET | Freq: Two times a day (BID) | ORAL | Status: DC
  Administered 2016-08-01: 500 mg via ORAL
  Filled 2016-08-01: qty 1

## 2016-08-01 MED ORDER — PAROXETINE HCL 40 MG PO TABS: 40.0000 mg | ORAL_TABLET | Freq: Every day | ORAL | Status: DC

## 2016-08-01 MED ORDER — DICLOFENAC SODIUM 50 MG PO TBEC
50.0000 mg | DELAYED_RELEASE_TABLET | Freq: Three times a day (TID) | ORAL | Status: DC
Start: 2016-08-01 — End: 2016-08-29

## 2016-08-01 MED ORDER — ACETAMINOPHEN 500 MG PO TABS: 500.0000 mg | ORAL_TABLET | Freq: Three times a day (TID) | ORAL | 0 refills | Status: DC | PRN

## 2016-08-01 MED ORDER — ONDANSETRON HCL 4 MG PO TABS: 4.0000 mg | ORAL_TABLET | Freq: Four times a day (QID) | ORAL | 0 refills | Status: DC | PRN

## 2016-08-01 NOTE — Clinical Social Work Note (Signed)
Patient will discharge back to [facility] today, transported by ambulance. Discharge clinicals transmitted to facility. CSW talked with patient and her husband is aware of today's discharge, she was not able to tell him what time she would be picked up so that they can go to [facility]. Patient advised that will try to get ETA once transport called.  Crawford Givens, MSW, LCSW Licensed Clinical Social Worker [place] [phone]

## 2016-08-01 NOTE — Progress Notes (Signed)
Patient to D/C to SNF, report called and given to [facility] at [facility]. Last PRN for pain was given and dressing changed at 5pm. Patient's PICC line was discontinued by IV team and telemetry was discontinued. Transport was called.

## 2016-08-01 NOTE — Discharge Summary (Signed)
[facility] Discharge Summary   Patient ID: Amy [name] MRN: [id number] DOB/AGE: 11/28/1945 26 y.o.  Admit date: 07/20/2016 Discharge date: 08/01/2016  Admitting Diagnosis: Wound infection after surgery  Discharge Diagnosis Patient Active Problem List   Diagnosis Date Noted  . Wound dehiscence 07/22/2016  . Dyspnea 07/21/2016  . Acute respiratory failure ([place]) 07/21/2016  . Acute encephalopathy 07/21/2016  . Anemia 07/21/2016  . Pancreatitis 07/02/2016  . SBO (small bowel obstruction) 07/02/2016  . Long term current use of opiate analgesic 06/26/2016  . Long term prescription opiate use 06/26/2016  . Opiate use 06/26/2016  . Encounter for therapeutic drug level monitoring 06/26/2016  . Encounter for pain management planning 06/26/2016  . Chronic pain 06/26/2016  . Disturbance of skin sensation 06/26/2016  . Chronic low back pain (Location of Primary Source of Pain) (Bilateral) (R>L) 06/26/2016  . Chronic hip pain (Location of Secondary source of pain) (Bilateral) (R>L) 06/26/2016  . 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 Chronic shoulder pain (Location of Tertiary source of pain) (Bilateral) (R>L) 06/26/2016  . 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 Chronic knee pain (Bilateral) (R>L) 06/26/2016  . 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 Chronic neck pain (Right) 06/26/2016  . Lumbar spondylosis 06/26/2016  . Lumbar facet syndrome 06/26/2016  . Lumbar facet hypertrophy 06/26/2016  . 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 Neurogenic pain 06/26/2016  . Occipital headaches 06/26/2016  . 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 History of lumbar fusion 06/26/2016  . 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  . Neuropathic pain of both legs 10/13/2015  . Constipation 10/13/2015  . Complete rotator cuff tear of left shoulder 05/05/2014  . Depression  08/06/2013  . Hypokalemia 08/06/2013  . GERD (gastroesophageal reflux disease) 08/06/2013  . Spinal stenosis of lumbar region 08/06/2013  . Bladder spasm 08/06/2013  . Obesity, Class III, BMI 40-49.9 (morbid obesity) ([place]) 02/03/2013  . Lipoma of buttock s/p excision 02/20/2013 02/03/2013  . DYSPNEA 01/23/2008  . Obstructive sleep apnea 09/12/2007  . HTN (hypertension) 09/12/2007  . Allergic rhinitis 09/12/2007  . SLEEPINESS 09/12/2007  . ANGINA, HX OF 09/12/2007    Consultants None  Imaging: CT abdomen pelvis w contrast 07/21/16: 1. No of evidence of pulmonary embolus. 2. Trace bilateral pleural effusions and atelectasis. 3. Midline anterior abdominal wound with air within the wound track and surrounding mild inflammatory changes concerning for cellulitis. No drainable fluid collection. 4. Multiple loops of small bowel are matted along the anterior abdominal wall just beneath the incision site with wall thickening most concerning for enteritis. Mild distention of small bowel loops measuring approximately 3.6 cm in diameter concerning for partial small bowel obstruction.  Procedures None this admission  Hospital Course:  Amy [name] is a 70yo female s/p diagnostic laparoscopy emergent coversion to open with LOA x 3 hours on 07/06/16 by Dr. Redmond Pulling who was readmitted to [facility] on 07/21/16 with a postoperative wound infection. Patient had been discharged to [facility] and after suture removal they noticed purulent drainage from her wound. Upon admission her wound was opened down to the fascia and significant amount of purulence removed; the fascia was found to be intact. She was started on aztreonam and flagyl, transitioned to rocephin once culture returned. We continued with twice daily wet to dry dressing changes. Wound/ostomy team was consulted and did not recommend a wound vac, rather to continue local wound care. Drainage stopped and there was no  concern for fistula. Wound  healing slowly, but stable and no concern for worsening infection. She did get a yeast infection under her panus; she had 2 doses of diflucan and Interdry was placed under panus 07/30/16 to help decrease moisture in this area; this should be removed on 08/04/16. Pain was stable on PO medication. On 08/01/16 Amy [name] was tolerating diet, having regular bowel movements, ambulating well, pain controlled, vital signs stable, wound stable and felt stable for discharge back to SNF.  She will be on cipro BID through 08/03/16. Please continue BID wet to dry dressing changes (wound care instructions in AVS). Patient will follow up in our office in 1-2 weeks; we are working on making this appointment.   Physical Exam: Gen: Alert, NAD, pleasant Cardio: RRR Pulm: Effort normal, CTAB Abd: Soft, ND, +BS, midline abdominal incision with beefy red tissue around edges and no active drainage, persistent erythema at distal aspect of incision and under panus consistent with yeast infection- stable compared to yesterday    Medication List    STOP taking these medications   CALCIUM-VITAMIN D3 PO   diphenhydrAMINE 25 mg capsule Commonly known as:  BENADRYL   doxycycline 100 MG capsule Commonly known as:  VIBRAMYCIN   meloxicam 15 MG tablet Commonly known as:  MOBIC   METANX [id number] MG Caps   oxyCODONE 5 MG immediate release tablet Commonly known as:  Oxy IR/ROXICODONE     TAKE these medications   acetaminophen 500 MG tablet Commonly known as:  TYLENOL Take 1 tablet (500 mg total) by mouth every 8 (eight) hours as needed (pain). What changed:  how much to take   calcium-vitamin D 500-200 MG-UNIT tablet Commonly known as:  OSCAL WITH D Take 1 tablet by mouth daily with breakfast. Start taking on:  08/02/2016   ciprofloxacin 500 MG tablet Commonly known as:  CIPRO Take 1 tablet (500 mg total) by mouth 2 (two) times daily.   cloNIDine 0.1 MG tablet Commonly known as:  CATAPRES Take  1 tablet (0.1 mg total) by mouth 3 (three) times daily.   DECUBI-VITE PO Take 1 tablet by mouth daily.   diclofenac 50 MG EC tablet Commonly known as:  VOLTAREN Take 1 tablet (50 mg total) by mouth 3 (three) times daily.   estradiol 0.1 MG/GM vaginal cream Commonly known as:  ESTRACE Place 1 Applicatorful vaginally 2 (two) times a week. Start taking on:  08/02/2016 What changed:  additional instructions   feeding supplement (ENSURE ENLIVE) Liqd Take 237 mLs by mouth 2 (two) times daily between meals.   fluticasone 50 MCG/ACT nasal spray Commonly known as:  FLONASE Place 1 spray into both nostrils daily. Start taking on:  08/02/2016   gabapentin 300 MG capsule Commonly known as:  NEURONTIN Take 300 mg by mouth 2 (two) times daily.   hydrALAZINE 25 MG tablet Commonly known as:  APRESOLINE Take 25 mg by mouth 3 (three) times daily.   HYDROcodone-acetaminophen 5-325 MG tablet Commonly known as:  NORCO/VICODIN Take 1-2 tablets by mouth every 4 (four) hours as needed for moderate pain or severe pain.   ICY HOT EX Apply 1 application topically 2 (two) times daily as needed (pain). Gel   l-methylfolate-B6-B12 3-35-2 MG Tabs tablet Commonly known as:  METANX Take 1 tablet by mouth 2 (two) times daily.   metoprolol 50 MG tablet Commonly known as:  LOPRESSOR Take 1 tablet (50 mg total) by mouth 2 (two) times daily.   ondansetron 4 MG tablet Commonly known  as:  ZOFRAN Take 1 tablet (4 mg total) by mouth every 6 (six) hours as needed for nausea.   pantoprazole 40 MG tablet Commonly known as:  PROTONIX Take 40 mg by mouth daily.   PARoxetine 40 MG tablet Commonly known as:  PAXIL Take 1 tablet (40 mg total) by mouth daily. Start taking on:  08/02/2016 What changed:  additional instructions   polyethylene glycol packet Commonly known as:  MIRALAX / GLYCOLAX Take 17 g by mouth daily. Start taking on:  08/02/2016   senna-docusate 8.6-50 MG tablet Commonly known as:   Senokot-S Take 2 tablets by mouth 2 (two) times daily.   silodosin 8 MG Caps capsule Commonly known as:  RAPAFLO Take 8 mg by mouth daily as needed (difficulty urinating).   traMADol 50 MG tablet Commonly known as:  ULTRAM Take 1 tablet (50 mg total) by mouth every 6 (six) hours as needed for moderate pain.   UNABLE TO FIND Med Name: Med pass 90 mL 2 times daily   VITAMIN B COMPLEX PO Take 1 tablet by mouth daily.   vitamin E 400 UNIT capsule Take 400 Units by mouth daily.        Follow-up Information    Gayland Curry, MD Follow up.   Specialty:  General Surgery Why:  1-2 weeks. We are working on your appointment, please call to confirm. Contact information: [address] [place] [address] [phone]           Signed: Jerrye Beavers, [facility] 08/01/2016, 10:59 AM Pager: [phone] Consults: [phone] Mon-Fri 7:00 am-4:30 pm Sat-Sun 7:00 am-11:30 am

## 2016-08-01 NOTE — Progress Notes (Signed)
Patient ID: Amy [name], female   DOB: 09-20-1946, 70 y.o.   MRN: [id number]  [facility] Progress Note     Subjective: States that her back is causing more pain than her abdomen today. Abdominal pain unchanged. She continues to have some itching distal to the incision. Tolerating diet. Having BM's and no n/v.  Objective: Vital signs in last 24 hours: Temp:  [98.2 F (36.8 C)-98.6 F (37 C)] 98.6 F (37 C) (11/08 0933) Pulse Rate:  [62-83] 62 (11/08 0510) Resp:  [18-21] 20 (11/08 0933) BP: (144-199)/(61-85) 185/83 (11/08 0933) SpO2:  [97 %-99 %] 98 % (11/08 0933) Last BM Date: 07/30/16  Intake/Output from previous day: 11/07 0701 - 11/08 0700 In: 717 [P.O.:717] Out: 1800 [Urine:1800] Intake/Output this shift: Total I/O In: 120 [P.O.:120] Out: 250 [Urine:250]  PE: Gen: Alert, NAD, pleasant Cardio: RRR Pulm: Effort normal, CTAB Abd: Soft, ND, +BS, midline abdominal incision with beefy red tissue around edges and no active drainage, persistent erythema at distal aspect of incision and under panus consistent with yeast infection- stable compared to yesterday  Lab Results:   Recent Labs  08/01/16 0436  WBC 5.9  HGB 10.6*  HCT 32.2*  PLT 314   BMET  Recent Labs  08/01/16 0436  NA 138  K 3.4*  CL 100*  CO2 30  GLUCOSE 109*  BUN 7  CREATININE 0.72  CALCIUM 8.9   PT/INR No results for input(s): LABPROT, INR in the last 72 hours. CMP     Component Value Date/Time   NA 138 08/01/2016 0436   NA 140 02/14/2016   K 3.4 (L) 08/01/2016 0436   CL 100 (L) 08/01/2016 0436   CO2 30 08/01/2016 0436   GLUCOSE 109 (H) 08/01/2016 0436   BUN 7 08/01/2016 0436   BUN 13 02/14/2016   CREATININE 0.72 08/01/2016 0436   CALCIUM 8.9 08/01/2016 0436   PROT 5.7 (L) 07/26/2016 0540   ALBUMIN 2.2 (L) 07/26/2016 0540   AST 23 07/26/2016 0540   ALT 17 07/26/2016 0540   ALKPHOS 83 07/26/2016 0540   BILITOT 0.3 07/26/2016 0540   GFRNONAA >60  08/01/2016 0436   GFRAA >60 08/01/2016 0436   Lipase     Component Value Date/Time   LIPASE 61 (H) 07/04/2016 0318       Studies/Results: No results found.  Anti-infectives: Anti-infectives    Start     Dose/Rate Route Frequency Ordered Stop   08/02/16 1345  fluconazole (DIFLUCAN) tablet 150 mg     150 mg Oral  Once 07/30/16 1342     07/31/16 1500  cefTRIAXone (ROCEPHIN) 2 g in dextrose 5 % 50 mL IVPB     2 g 100 mL/hr over 30 Minutes Intravenous Every 24 hours 07/31/16 1231 08/03/16 1459   07/30/16 1345  fluconazole (DIFLUCAN) tablet 150 mg     150 mg Oral  Once 07/30/16 1342 07/30/16 1454   07/24/16 1400  cefTRIAXone (ROCEPHIN) 2 g in dextrose 5 % 50 mL IVPB  Status:  Discontinued     2 g 100 mL/hr over 30 Minutes Intravenous Every 24 hours 07/24/16 1219 07/31/16 1231   07/22/16 1400  meropenem (MERREM) 1 g in sodium chloride 0.9 % 100 mL IVPB  Status:  Discontinued     1 g 200 mL/hr over 30 Minutes Intravenous Every 8 hours 07/22/16 1338 07/24/16 1218   07/21/16 1300  vancomycin (VANCOCIN) 1,250 mg in sodium chloride 0.9 % 250 mL IVPB  Status:  Discontinued  1,250 mg 166.7 mL/hr over 90 Minutes Intravenous Every 12 hours 07/21/16 1028 07/24/16 1218   07/21/16 1030  aztreonam (AZACTAM) 1 g in dextrose 5 % 50 mL IVPB  Status:  Discontinued     1 g 100 mL/hr over 30 Minutes Intravenous Every 8 hours 07/21/16 1028 07/22/16 1337   07/21/16 1000  vancomycin (VANCOCIN) 1,250 mg in sodium chloride 0.9 % 250 mL IVPB  Status:  Discontinued     1,250 mg 166.7 mL/hr over 90 Minutes Intravenous Every 12 hours 07/21/16 0613 07/21/16 1028   07/21/16 1000  aztreonam (AZACTAM) 1 g in dextrose 5 % 50 mL IVPB  Status:  Discontinued     1 g 100 mL/hr over 30 Minutes Intravenous Every 8 hours 07/21/16 0613 07/21/16 1028   07/21/16 0800  metroNIDAZOLE (FLAGYL) IVPB 500 mg  Status:  Discontinued     500 mg 100 mL/hr over 60 Minutes Intravenous Every 8 hours 07/21/16 0613 07/22/16 1337    07/20/16 2345  aztreonam (AZACTAM) 2 g in dextrose 5 % 50 mL IVPB     2 g 100 mL/hr over 30 Minutes Intravenous  Once 07/20/16 2343 07/21/16 0108   07/20/16 2345  metroNIDAZOLE (FLAGYL) IVPB 500 mg     500 mg 100 mL/hr over 60 Minutes Intravenous  Once 07/20/16 2343 07/21/16 0144   07/20/16 2345  vancomycin (VANCOCIN) IVPB 1000 mg/200 mL premix     1,000 mg 200 mL/hr over 60 Minutes Intravenous  Once 07/20/16 2343 07/21/16 0456       Assessment/Plan Abdominal wound infection - S/p diagnostic laparoscopy emergent coversion to open with LOA x 3 hours on 10/13, Dr. Greer Pickerel - CT 10/28 showed midline anterior abdominal wound with air within the wound track and surrounding mild inflammatory changes concerning for cellulitis, no drainable fluid collection - Wound was opened down to the fascia 10/28, fascia partially dehisced  -  No evidence of fistula at this time - WBC WNL   Anemia - hg stable, 10.6 HTN - clonidine 0.1 TID, metoprolol 50 BID Chronic pain GERD OSA Depression - Paxil   ID: rocephin 10/31>> (day #9/10) VTE: SCD's, Lovenox per pharmacy FEN: heart healthy  Plan: Wound healing slow but stable, ready for discharge to SNF. Continued yeast infection under panus; encouraged regular daily showering, continue interdry total of 5 days, already had dose of diflucan. WBC normal, afebrile. Continue BID wet-to-dry dressing changes. Needs antibiotics total of 10 days, completed on 08/02/16; culture sensitive to cipro therefore will change to PO antibiotics. Continue PO pain medication as before. Will need close OP follow-up with Dr. Redmond Pulling in 1-2 weeks.    LOS: 10 days    Jerrye Beavers , [facility] Surgery 08/01/2016, 9:58 AM Pager: [phone] Consults: [phone] Mon-Fri 7:00 am-4:30 pm Sat-Sun 7:00 am-11:30 am

## 2016-08-01 NOTE — Progress Notes (Signed)
Hypoglycemic Event  CBG: 40  Treatment: 15 GM carbohydrate snack  Symptoms: None  Follow-up CBG: Time:8:08 CBG Result:99  Possible Reasons for Event: Other: *  Comments/MD notified: Will continue to monitor     [employer]

## 2016-08-01 NOTE — Progress Notes (Signed)
PT Cancellation Note  Patient Details Name: Amy [name] MRN: [id number] DOB: 13-Mar-1946   Cancelled Treatment:    Reason Eval/Treat Not Completed: Patient declined, no reason specified. Don't feel like it and leaving today. 08/01/2016  Donnella Sham, [place] [phone]  (pager)   [place], Tessie Fass 08/01/2016, 6:25 PM

## 2016-08-02 ENCOUNTER — Non-Acute Institutional Stay (SKILLED_NURSING_FACILITY): Payer: Medicare Other | Admitting: Internal Medicine

## 2016-08-02 ENCOUNTER — Encounter: Payer: Self-pay | Admitting: Internal Medicine

## 2016-08-02 DIAGNOSIS — L740 Miliaria rubra: Secondary | ICD-10-CM | POA: Diagnosis not present

## 2016-08-02 DIAGNOSIS — R5381 Other malaise: Secondary | ICD-10-CM

## 2016-08-02 DIAGNOSIS — D62 Acute posthemorrhagic anemia: Secondary | ICD-10-CM | POA: Diagnosis not present

## 2016-08-02 DIAGNOSIS — IMO0001 Reserved for inherently not codable concepts without codable children: Secondary | ICD-10-CM

## 2016-08-02 DIAGNOSIS — M792 Neuralgia and neuritis, unspecified: Secondary | ICD-10-CM | POA: Diagnosis not present

## 2016-08-02 DIAGNOSIS — F329 Major depressive disorder, single episode, unspecified: Secondary | ICD-10-CM | POA: Diagnosis not present

## 2016-08-02 DIAGNOSIS — Z872 Personal history of diseases of the skin and subcutaneous tissue: Secondary | ICD-10-CM | POA: Diagnosis not present

## 2016-08-02 DIAGNOSIS — M545 Low back pain, unspecified: Secondary | ICD-10-CM

## 2016-08-02 DIAGNOSIS — G8929 Other chronic pain: Secondary | ICD-10-CM | POA: Diagnosis not present

## 2016-08-02 DIAGNOSIS — T814XXD Infection following a procedure, subsequent encounter: Secondary | ICD-10-CM

## 2016-08-02 DIAGNOSIS — K56609 Unspecified intestinal obstruction, unspecified as to partial versus complete obstruction: Secondary | ICD-10-CM | POA: Diagnosis not present

## 2016-08-02 DIAGNOSIS — I10 Essential (primary) hypertension: Secondary | ICD-10-CM

## 2016-08-02 DIAGNOSIS — K219 Gastro-esophageal reflux disease without esophagitis: Secondary | ICD-10-CM | POA: Diagnosis not present

## 2016-08-02 NOTE — Progress Notes (Signed)
LOCATION: [name]  PCP: Blanchie Serve, MD   Code Status: DNR  Goals of care: Advanced Directive information Advanced Directives 07/21/2016  Does patient have an advance directive? Yes  Type of Advance Directive Living will;Out of facility DNR (pink MOST or yellow form)  Does patient want to make changes to advanced directive? -  Copy of advanced directive(s) in chart? No - copy requested  Would patient like information on creating an advanced directive? -  Pre-existing out of facility DNR order (yellow form or pink MOST form) -       Extended Emergency Contact Information Primary Emergency Contact: [name] Address: [address]          [place], [place] [address] [place] Phone: [phone] Mobile Phone: [phone] Relation: Spouse Secondary Emergency Contact: [name] Address: [address]          [place], [place] [address] [name] of [place] Phone: [phone] Mobile Phone: [phone] Relation: Daughter   Allergies  Allergen Reactions  . Latex Other (See Comments)    Sores, blisters - reaction to gloves  . Penicillins Other (See Comments)    Pt was told she was allergic to penicillin in her mid 20'2 - does not recall the reaction. She has taken amoxicillin and ampicillin with no reaction.     Chief Complaint  Patient presents with  . Readmit To SNF    Readmission Visit      HPI:  Patient is a 70 y.o. female seen today for short term rehabilitation post hospital re-admission from 07/20/16-08/01/16 with wound infection of surgical incision. She was placed on iv antibiotics and had wound care. No wound vac is recommended at this time. She was switched to oral antibiotic. Patient was at this SNF for short term rehabilitation post hospital admission from 07/02/16-07/16/16 with small bowel obstruction status post exploratory laprotomy and lysis of adhesions. She is seen in her room today. Her sacral pressure  ulcer that was present on last visit has resolved.   Review of Systems:  Constitutional: Negative for fever, chills, diaphoresis. Feels weak and tired.  HENT: Negative for congestion, nasal discharge, sore throat, difficulty swallowing. Positive for headaches.  Eyes: Negative for blurred vision, double vision and discharge.  Respiratory: Negative for cough, shortness of breath and wheezing.   Cardiovascular: Negative for chest pain, palpitations, leg swelling.  Gastrointestinal: Negative for heartburn, nausea, vomiting, diarrhea and constipation. Positive for abdominal pain which worsen when she eats a big meal.  Last bowel movement was yesterday.   Genitourinary: Negative for dysuria and flank pain.  Musculoskeletal: Negative for fall in the facility. Positive for back pain.  Skin: Negative for itching, rash.  Neurological: Positive for dizziness with change of position. Psychiatric/Behavioral: Negative for depression   Past Medical History:  Diagnosis Date  . Abdominal pain of unknown etiology 02/05/2016  . Anxiety   . Arthritis    ddd- RA  . Asthma    "sleeping asthma"  . Chronic back pain    lumbar steroid injection's  . Constipation   . Depression   . Dysphonia    intermittent "voice changes"  . Fibromyalgia   . GERD (gastroesophageal reflux disease)   . Glaucoma   . Headache   . History of DVT (deep vein thrombosis) 1989   LEFT UPPER ARM  . History of hiatal hernia   . History of MRSA infection 2011  . Hypertension   . Irregular heart rate    "years ago"  .  Low iron   . Mild obstructive sleep apnea    per study 02-07-2006 - no cpap  . Neuropathy (HCC)    feet  . Pelvic pain in female   . Peripheral vascular disease ([place])    "poor circulation"  . SBO (small bowel obstruction) 06/2016  . Seasonal allergies   . Short of breath on exertion   . Urinary frequency   . Weakness    both hands and feet   Past Surgical History:  Procedure Laterality Date  .  abdominal adhesions removed    . ABDOMINAL HYSTERECTOMY  1978  . BUNIONECTOMY Left 2008  . COLONOSCOPY    . CYSTO WITH HYDRODISTENSION N/A 07/12/2015   Procedure: CYSTOSCOPY/HYDRODISTENSION;  Surgeon: Bjorn Loser, MD;  Location: [facility];  Service: Urology;  Laterality: N/A;  . ESOPHAGEAL MANOMETRY N/A 11/22/2014   Procedure: ESOPHAGEAL MANOMETRY (EM);  Surgeon: Winfield Cunas., MD;  Location: [facility];  Service: Endoscopy;  Laterality: N/A;  . ESOPHAGOGASTRODUODENOSCOPY N/A 07/15/2013   Procedure: ESOPHAGOGASTRODUODENOSCOPY (EGD);  Surgeon: Winfield Cunas., MD;  Location: [facility];  Service: Endoscopy;  Laterality: N/A;  need xray  . EYE SURGERY Bilateral    cataract surgery with lens implants  . HARDWARE REMOVAL N/A 01/02/2016   Procedure: Exploration of Lumbar Fusion,Removal of hardware Lumbar One-Two ;Redo Posterior Lumbar Fusion Lumbar One-Two;  Surgeon: Kary Kos, MD;  Location: [facility];  Service: Neurosurgery;  Laterality: N/A;  . LAPAROSCOPIC CHOLECYSTECTOMY  01-11-2006  . LAPAROSCOPY N/A 07/06/2016   Procedure: LAPAROSCOPY DIAGNOSTIC EMERGENT TO OPEN;  Surgeon: Greer Pickerel, MD;  Location: [facility];  Service: General;  Laterality: N/A;  . LAPAROTOMY N/A 07/06/2016   Procedure: EXPLORATORY LAPAROTOMY LYSIS OF ADHESIONS FOR 3 HOURS;  Surgeon: Greer Pickerel, MD;  Location: [facility];  Service: General;  Laterality: N/A;  . LUMBAR FUSION  2014   L4 -- L5  . MASS EXCISION Left 02/20/2013   Procedure: EXCISION LEFT BUTTOCK  MASS;  Surgeon: Adin Hector, MD;  Location: [facility];  Service: General;  Laterality: Left;  . NOSE SURGERY  2007  . REMOVAL HARDWARE L4-L5/  BILATERAL LAMINECTOMY L2 - L5 AND FUSION  06-12-2011  . SHOULDER OPEN ROTATOR CUFF REPAIR Left 05/05/2014   Procedure: OPEN ACROMIONECTOMY AND OPEN REPAIR OF ROTATOR CUFF, TISSUEMEND GRAFT WITH ANCHOR ;  Surgeon: Tobi Bastos, MD;  Location: [facility];  Service: Orthopedics;  Laterality: Left;    . SPINE SURGERY    . TONSILLECTOMY     Social History:   reports that she quit smoking about 47 years ago. She has never used smokeless tobacco. She reports that she drinks alcohol. She reports that she does not use drugs.  Family History  Problem Relation Age of Onset  . Hypertension Mother   . Cancer Mother     cervix  . Kidney disease Mother   . Hypertension Sister   . Cancer Sister     polyps  . Kidney disease Brother   . Cancer Daughter     leukemia  . Hypertension Brother     Medications:   Medication List       Accurate as of 08/02/16 11:09 AM. Always use your most recent med list.          acetaminophen 500 MG tablet Commonly known as:  TYLENOL Take 1,000 mg by mouth every 8 (eight) hours as needed for moderate pain.   calcium-vitamin D 500-200 MG-UNIT tablet Commonly known as:  OSCAL WITH D Take 1 tablet by mouth daily with breakfast.   ciprofloxacin 500 MG tablet Commonly known as:  CIPRO Take 1 tablet (500 mg total) by mouth 2 (two) times daily.   cloNIDine 0.1 MG tablet Commonly known as:  CATAPRES Take 1 tablet (0.1 mg total) by mouth 3 (three) times daily.   DECUBI-VITE PO Take 1 tablet by mouth daily.   diclofenac 50 MG EC tablet Commonly known as:  VOLTAREN Take 1 tablet (50 mg total) by mouth 3 (three) times daily.   estradiol 0.1 MG/GM vaginal cream Commonly known as:  ESTRACE Place 1 Applicatorful vaginally 2 (two) times a week.   feeding supplement (ENSURE ENLIVE) Liqd Take 237 mLs by mouth 2 (two) times daily between meals.   fluticasone 50 MCG/ACT nasal spray Commonly known as:  FLONASE Place 1 spray into both nostrils daily.   gabapentin 300 MG capsule Commonly known as:  NEURONTIN Take 300 mg by mouth 2 (two) times daily.   hydrALAZINE 25 MG tablet Commonly known as:  APRESOLINE Take 25 mg by mouth 3 (three) times daily.   HYDROcodone-acetaminophen 5-325 MG tablet Commonly known as:  NORCO/VICODIN Take 1-2 tablets by  mouth every 4 (four) hours as needed for moderate pain or severe pain.   ICY HOT EX Apply 1 application topically 2 (two) times daily as needed (pain). Gel   l-methylfolate-B6-B12 3-35-2 MG Tabs tablet Commonly known as:  METANX Take 1 tablet by mouth 2 (two) times daily.   metoprolol 50 MG tablet Commonly known as:  LOPRESSOR Take 1 tablet (50 mg total) by mouth 2 (two) times daily.   ondansetron 4 MG tablet Commonly known as:  ZOFRAN Take 1 tablet (4 mg total) by mouth every 6 (six) hours as needed for nausea.   pantoprazole 40 MG tablet Commonly known as:  PROTONIX Take 40 mg by mouth daily.   PARoxetine 40 MG tablet Commonly known as:  PAXIL Take 1 tablet (40 mg total) by mouth daily.   polyethylene glycol packet Commonly known as:  MIRALAX / GLYCOLAX Take 17 g by mouth daily.   senna-docusate 8.6-50 MG tablet Commonly known as:  Senokot-S Take 2 tablets by mouth 2 (two) times daily.   UNABLE TO FIND Med Name: Med pass 90 mL 2 times daily   VITAMIN B COMPLEX PO Take 1 tablet by mouth daily.   vitamin E 400 UNIT capsule Take 400 Units by mouth daily.       Immunizations: Immunization History  Administered Date(s) Administered  . Influenza-Unspecified 06/26/2016  . PPD Test 10/03/2015, 01/06/2016, 01/16/2016, 02/01/2016, 02/15/2016     Physical Exam:  Vitals:   08/02/16 1100  BP: (!) 172/88  Pulse: 64  Resp: 18  Temp: 97.8 F (36.6 C)  TempSrc: Oral  SpO2: 96%  Weight: 273 lb (123.8 kg)  Height: 5\' 5"  (1.651 m)   Body mass index is 45.43 kg/m.  General- elderly female, morbidly obese, in no acute distress Head- normocephalic, atraumatic Nose-  no nasal discharge Throat- moist mucus membrane Eyes- PERRLA, EOMI, no pallor, no icterus, no discharge, normal conjunctiva, normal sclera Neck- no cervical lymphadenopathy Cardiovascular- normal s1,s2, no murmur, trace leg edema Respiratory- bilateral clear to auscultation, no wheeze, no rhonchi,  no crackles, no use of accessory muscles Abdomen- bowel sounds present, soft, generalized abdominal wall tenderness, no guarding or rigidity, no CVA tenderness Musculoskeletal- able to move all 4 extremities, generalized weakness Neurological-  alert and oriented to person, place and time Skin-  warm and dry, surgical incision to mid abdominal wall with open wound without drainage- 17 x 6 x 6 cm, slough present, sutures noted on inner layer, beefy red granulation tissue at wound, border of wound well defined, erythema around the distal end of incision Psychiatry- normal mood and affect    Labs reviewed: Basic Metabolic Panel:  Recent Labs  07/25/16 0955 07/26/16 0540 07/27/16 0415 07/28/16 0500 08/01/16 0436  NA 140 139 139 138 138  K 3.4* 3.1* 3.3* 3.8 3.4*  CL 104 101 103 101 100*  CO2 32 32 31 29 30   GLUCOSE 85 107* 116* 96 109*  BUN <5* <5* <5* 9 7  CREATININE 0.60 0.63 0.57 0.69 0.72  CALCIUM 8.2* 8.3* 8.3* 8.5* 8.9  MG 1.8 1.9 2.2  --   --   PHOS 3.0 2.9 3.6  --   --    Liver Function Tests:  Recent Labs  07/21/16 0011 07/23/16 0602 07/26/16 0540  AST 26 11* 23  ALT 28 15 17   ALKPHOS 103 85 83  BILITOT 0.8 0.4 0.3  PROT 6.3* 5.9* 5.7*  ALBUMIN 2.5* 2.3* 2.2*    Recent Labs  07/02/16 1650 07/03/16 0627 07/04/16 0318  LIPASE 3,627* 627* 61*   No results for input(s): AMMONIA in the last 8760 hours. CBC:  Recent Labs  07/03/16 0627  07/11/16 0433  07/21/16 0123  07/26/16 0540 07/28/16 0500 08/01/16 0436  WBC 4.5  < > 11.5*  < > 8.0  < > 5.0 5.6 5.9  NEUTROABS 3.0  --  9.0*  --  5.9  --   --   --   --   HGB 14.5  < > 11.2*  < > 9.7*  < > 9.4* 9.8* 10.6*  HCT 44.1  < > 33.5*  < > 29.7*  < > 29.1* 30.2* 32.2*  MCV 91.9  < > 89.8  --  90.8  < > 90.4 90.4 91.5  PLT 307  < > 275  < > 583*  < > 464* 416* 314  < > = values in this interval not displayed. Cardiac Enzymes:  Recent Labs  07/10/16 1907 07/10/16 2231 07/11/16 0433  TROPONINI <0.03  0.09* 0.04*   BNP: Invalid input(s): POCBNP CBG:  Recent Labs  08/01/16 0801 08/01/16 1153 08/01/16 1620  GLUCAP 100* 100* 113*    Radiological Exams: Ct Angio Chest Pe W Or Wo Contrast  Result Date: 07/21/2016 CLINICAL DATA:  Shortness of breath. Intra abdominal infection. Cellulitis. EXAM: CT ANGIOGRAPHY CHEST CT ABDOMEN AND PELVIS WITH CONTRAST TECHNIQUE: Multidetector CT imaging of the chest was performed using the standard protocol during bolus administration of intravenous contrast. Multiplanar CT image reconstructions and MIPs were obtained to evaluate the vascular anatomy. Multidetector CT imaging of the abdomen and pelvis was performed using the standard protocol during bolus administration of intravenous contrast. CONTRAST:  100 mL Isovue 370 COMPARISON:  None. FINDINGS: CTA CHEST FINDINGS Cardiovascular: Satisfactory opacification of the pulmonary arteries to the segmental level. No evidence of pulmonary embolism. Normal heart size. No pericardial effusion. Normal caliber thoracic aorta with mild atherosclerosis. No thoracic aortic dissection. Mediastinum/Nodes: No enlarged mediastinal, hilar, or axillary lymph nodes. Thyroid gland, trachea, and esophagus demonstrate no significant findings. Lungs/Pleura: Trace bilateral pleural effusions. Mild bibasilar atelectasis. No focal consolidation. No pneumothorax. Musculoskeletal: No acute osseous abnormality. Mild mid thoracic degenerative disc disease. No aggressive lytic or sclerotic osseous lesion. Review of the MIP images confirms the above findings. CT ABDOMEN and PELVIS FINDINGS  Hepatobiliary: No focal liver abnormality is seen. Status post cholecystectomy. No biliary dilatation. Pancreas: Unremarkable. No pancreatic ductal dilatation or surrounding inflammatory changes. Spleen: Normal in size without focal abnormality. Adrenals/Urinary Tract: Adrenal glands are unremarkable. Kidneys are normal, without renal calculi, focal lesion, or  hydronephrosis. Bladder is unremarkable. Stomach/Bowel: Midline anterior abdominal wound with air within the wound track and surrounding mild inflammatory changes concerning for cellulitis. No drainable fluid collection. Multiple loops of small bowel are matted along the anterior abdominal wall just beneath the incision site with wall thickening most concerning for enteritis. Mild distention of small bowel loops measuring approximately 3.6 cm in diameter concerning for partial small bowel obstruction. No pneumatosis, pneumoperitoneum or portal venous gas. Vascular/Lymphatic: Normal caliber abdominal aorta with atherosclerosis. No lymphadenopathy. Reproductive: Status post hysterectomy. No adnexal masses. Other: No fluid collection or hematoma. Musculoskeletal: Anterior lumbar interbody fusion at L4-5 and L5-S1 with solid osseous bridging. Posterior lumbar interbody fusion at L3-4 with solid osseous bridging across the L3-4 disc space and posterior elements. Posterior lumbar fusion at L2-3 with solid osseous fusion across the posterior elements. Posterior lumbar fusion at L1-2 with interbody prosthesis and laminectomy. No acute osseous abnormality. No aggressive lytic or sclerotic osseous lesion. Review of the MIP images confirms the above findings. IMPRESSION: 1. No of evidence of pulmonary embolus. 2. Trace bilateral pleural effusions and atelectasis. 3. Midline anterior abdominal wound with air within the wound track and surrounding mild inflammatory changes concerning for cellulitis. No drainable fluid collection. 4. Multiple loops of small bowel are matted along the anterior abdominal wall just beneath the incision site with wall thickening most concerning for enteritis. Mild distention of small bowel loops measuring approximately 3.6 cm in diameter concerning for partial small bowel obstruction. Electronically Signed   By: Kathreen Devoid   On: 07/21/2016 08:15   Ct Angio Chest Pe W Or Wo Contrast  Result  Date: 07/07/2016 CLINICAL DATA:  Exploratory laparotomy yesterday. Shortness of breath. Evaluate for pulmonary embolus. EXAM: CT ANGIOGRAPHY CHEST WITH CONTRAST TECHNIQUE: Multidetector CT imaging of the chest was performed using the standard protocol during bolus administration of intravenous contrast. Multiplanar CT image reconstructions and MIPs were obtained to evaluate the vascular anatomy. CONTRAST:  100 cc Isovue 370 COMPARISON:  None. FINDINGS: Cardiovascular: Heart size upper normal. No pericardial effusion. No thoracic aortic aneurysm. Assessment of segmental and subsegmental pulmonary arteries to the lower lobes is degraded by respiratory motion, but within this limitation there is no CT evidence for an acute pulmonary embolus. Mediastinum/Nodes: No mediastinal lymphadenopathy. There is no hilar lymphadenopathy. There is no axillary lymphadenopathy. NG tube passes into the stomach with nonvisualization of the distal tip. Lungs/Pleura: Bilateral lower lobe subsegmental atelectasis noted. Focal bandlike opacity in the lingula likely also atelectatic. 7 mm ground-glass nodule identified right lower lobe (image 59 series 5). No evidence for pneumothorax. No pleural effusion. Upper Abdomen: Stomach is fluid distended despite the presence of an NG tube. Musculoskeletal: Bone windows reveal no worrisome lytic or sclerotic osseous lesions. Review of the MIP images confirms the above findings. IMPRESSION: 1. No CT evidence for acute pulmonary embolus. 2. Bilateral lower lobe subsegmental atelectasis with probable lingular atelectasis associated. 3. Stomach is fluid distended despite the presence of an NG tube. Electronically Signed   By: Misty Stanley M.D.   On: 07/07/2016 14:31   Ct Abdomen Pelvis W Contrast  Result Date: 07/21/2016 CLINICAL DATA:  Shortness of breath. Intra abdominal infection. Cellulitis. EXAM: CT ANGIOGRAPHY CHEST CT ABDOMEN AND PELVIS WITH  CONTRAST TECHNIQUE: Multidetector CT imaging of  the chest was performed using the standard protocol during bolus administration of intravenous contrast. Multiplanar CT image reconstructions and MIPs were obtained to evaluate the vascular anatomy. Multidetector CT imaging of the abdomen and pelvis was performed using the standard protocol during bolus administration of intravenous contrast. CONTRAST:  100 mL Isovue 370 COMPARISON:  None. FINDINGS: CTA CHEST FINDINGS Cardiovascular: Satisfactory opacification of the pulmonary arteries to the segmental level. No evidence of pulmonary embolism. Normal heart size. No pericardial effusion. Normal caliber thoracic aorta with mild atherosclerosis. No thoracic aortic dissection. Mediastinum/Nodes: No enlarged mediastinal, hilar, or axillary lymph nodes. Thyroid gland, trachea, and esophagus demonstrate no significant findings. Lungs/Pleura: Trace bilateral pleural effusions. Mild bibasilar atelectasis. No focal consolidation. No pneumothorax. Musculoskeletal: No acute osseous abnormality. Mild mid thoracic degenerative disc disease. No aggressive lytic or sclerotic osseous lesion. Review of the MIP images confirms the above findings. CT ABDOMEN and PELVIS FINDINGS Hepatobiliary: No focal liver abnormality is seen. Status post cholecystectomy. No biliary dilatation. Pancreas: Unremarkable. No pancreatic ductal dilatation or surrounding inflammatory changes. Spleen: Normal in size without focal abnormality. Adrenals/Urinary Tract: Adrenal glands are unremarkable. Kidneys are normal, without renal calculi, focal lesion, or hydronephrosis. Bladder is unremarkable. Stomach/Bowel: Midline anterior abdominal wound with air within the wound track and surrounding mild inflammatory changes concerning for cellulitis. No drainable fluid collection. Multiple loops of small bowel are matted along the anterior abdominal wall just beneath the incision site with wall thickening most concerning for enteritis. Mild distention of small bowel  loops measuring approximately 3.6 cm in diameter concerning for partial small bowel obstruction. No pneumatosis, pneumoperitoneum or portal venous gas. Vascular/Lymphatic: Normal caliber abdominal aorta with atherosclerosis. No lymphadenopathy. Reproductive: Status post hysterectomy. No adnexal masses. Other: No fluid collection or hematoma. Musculoskeletal: Anterior lumbar interbody fusion at L4-5 and L5-S1 with solid osseous bridging. Posterior lumbar interbody fusion at L3-4 with solid osseous bridging across the L3-4 disc space and posterior elements. Posterior lumbar fusion at L2-3 with solid osseous fusion across the posterior elements. Posterior lumbar fusion at L1-2 with interbody prosthesis and laminectomy. No acute osseous abnormality. No aggressive lytic or sclerotic osseous lesion. Review of the MIP images confirms the above findings. IMPRESSION: 1. No of evidence of pulmonary embolus. 2. Trace bilateral pleural effusions and atelectasis. 3. Midline anterior abdominal wound with air within the wound track and surrounding mild inflammatory changes concerning for cellulitis. No drainable fluid collection. 4. Multiple loops of small bowel are matted along the anterior abdominal wall just beneath the incision site with wall thickening most concerning for enteritis. Mild distention of small bowel loops measuring approximately 3.6 cm in diameter concerning for partial small bowel obstruction. Electronically Signed   By: Kathreen Devoid   On: 07/21/2016 08:15   Ct Abdomen Pelvis W Contrast  Result Date: 07/05/2016 CLINICAL DATA:  69 year old female with recently diagnosed small bowel obstruction and pancreatitis. Continued abdominal pelvic pain with nausea. EXAM: CT ABDOMEN AND PELVIS WITH CONTRAST TECHNIQUE: Multidetector CT imaging of the abdomen and pelvis was performed using the standard protocol following bolus administration of intravenous contrast. CONTRAST:  100 cc intravenous Isovue 300 COMPARISON:   07/02/2016 and prior CTs. FINDINGS: Lower chest: Increased moderate bibasilar atelectasis identified. Hepatobiliary: The liver is unremarkable. The patient is status post cholecystectomy. There is no evidence of biliary dilatation. Pancreas: Unremarkable Spleen: Unremarkable Adrenals/Urinary Tract: The kidneys, adrenal glands and bladder are unremarkable except for unchanged 1.7 cm left adrenal adenoma. Stomach/Bowel: An NG tube is  present with tip in the mid stomach. Dilated proximal-mid small bowel loops are again identified with collapsed distal small bowel loops, compatible with small bowel obstruction. There has been interval decreased caliber of stomach and dilated small bowel loops since the prior study. There is no evidence of definite bowel wall thickening or pneumoperitoneum. Vascular/Lymphatic: Abdominal aortic atherosclerotic calcifications noted without aneurysm. No enlarged lymph nodes identified. Reproductive: Patient is status post hysterectomy. No adnexal masses identified. Other: No free fluid or focal collection/abscess. Musculoskeletal: Surgical changes within the lumbar spine again identified. No acute or suspicious abnormality. IMPRESSION: Small bowel obstruction again noted, with decompression of stomach and decreased caliber of dilated small bowel loops from NG tube within the stomach. No evidence of abscess, free fluid or pneumoperitoneum. Increased moderate bibasilar atelectasis. Abdominal aortic atherosclerosis. Electronically Signed   By: Margarette Canada M.D.   On: 07/05/2016 13:26   Dg Chest Port 1 View  Result Date: 07/21/2016 CLINICAL DATA:  Initial evaluation for acute shortness of breath. EXAM: PORTABLE CHEST 1 VIEW COMPARISON:  Prior radiograph from 07/10/2016. FINDINGS: Mild cardiomegaly is stable. Mediastinal silhouette within normal limits. Lungs are hypoinflated. Diffuse vascular congestion with indistinctness of the interstitial markings, suggesting mild/ early pulmonary  edema. Superimposed linear opacity at the left lung base most compatible with atelectasis/ scar, similar to prior. No definite focal infiltrates. No pleural effusion. No pneumothorax. No acute osseus abnormality. IMPRESSION: 1. Stable cardiomegaly with mild diffuse pulmonary vascular congestion and indistinctness of the interstitial markings, suggesting mild and/or developing pulmonary edema. 2. Stable left basilar atelectasis and/or scar. Electronically Signed   By: Jeannine Boga M.D.   On: 07/21/2016 01:05   Dg Chest Port 1 View  Result Date: 07/10/2016 CLINICAL DATA:  Shortness of breath, hypertension, asthma EXAM: PORTABLE CHEST 1 VIEW COMPARISON:  07/07/2016 FINDINGS: Cardiomediastinal silhouette is stable. Mild basilar atelectasis. No pulmonary edema. NG tube in place. Linear atelectasis or infiltrate in lingula. IMPRESSION: No pulmonary edema. Mild basilar atelectasis. Linear atelectasis or infiltrate in lingula. Electronically Signed   By: Lahoma Crocker M.D.   On: 07/10/2016 11:07   Dg Chest Port 1 View  Result Date: 07/05/2016 CLINICAL DATA:  Chest pain EXAM: PORTABLE CHEST 1 VIEW COMPARISON:  07/02/2016 FINDINGS: Cardiomediastinal silhouette is stable. Stable atelectasis or scarring right base. There is streaky atelectasis or infiltrate left base. No pulmonary edema. NG tube poorly visualized due to patient's large body habitus. IMPRESSION: No pulmonary edema. Right base atelectasis or scarring. Streaky left basilar atelectasis or infiltrate. Electronically Signed   By: Lahoma Crocker M.D.   On: 07/05/2016 12:28   Dg Abd Portable 1v  Result Date: 07/05/2016 CLINICAL DATA:  Small-bowel obstruction. EXAM: PORTABLE ABDOMEN - 1 VIEW COMPARISON:  07/04/2016 FINDINGS: NG tube in the stomach. Stomach is decompressed. No dilated loops of bowel identified. No bowel wall edema. Lumbar fusion hardware unchanged. IMPRESSION: Nonobstructive bowel gas pattern.  NG tube remains in the stomach.  Electronically Signed   By: Franchot Gallo M.D.   On: 07/05/2016 08:43   Dg Abd Portable 1v-small Bowel Obstruction Protocol-24 Hr Delay  Result Date: 07/04/2016 CLINICAL DATA:  Delayed image for assessment of small bowel obstruction. EXAM: PORTABLE ABDOMEN - 1 VIEW COMPARISON:  07/03/2016 FINDINGS: Contrast injected into the stomach under previous exam is no longer visualized. It has passed. There is no evidence of small-bowel obstruction. Nasogastric tube is well positioned within the mid stomach. Bowel gas pattern is unremarkable. IMPRESSION: 1. No evidence of a small-bowel obstruction. Electronically Signed  By: Lajean Manes M.D.   On: 07/04/2016 09:15   Dg Abd Portable 1v-small Bowel Obstruction Protocol-initial, 8 Hr Delay  Result Date: 07/03/2016 CLINICAL DATA:  70 year old female with small bowel obstruction. Subsequent encounter. EXAM: PORTABLE ABDOMEN - 1 VIEW COMPARISON:  07/02/2016 plain film and CT. FINDINGS: Nasogastric tube with tip gastric body level. Contrast and gas-filled dilated stomach. Gas-filled small bowel loops. The caliber of the small bowel loops is not adequately assessed by plain film examination (as small bowel loops may be more fluid-filled than gas-filled when compared to prior exam) and small bowel obstructive pattern may still be present. The possibility of free intraperitoneal air cannot be assessed on a supine view. Postsurgical changes lumbar spine. IMPRESSION: Contrast and gas-filled dilated stomach. Gas-filled small bowel loops. The caliber of the small bowel loops is not adequately assessed by plain film examination (as small bowel loops may be more fluid-filled than gas-filled when compared to prior exam) and small bowel obstructive pattern may still be present. Electronically Signed   By: Genia Del M.D.   On: 07/03/2016 15:51    Assessment/Plan  Physical deconditioning Will have her work with physical therapy and occupational therapy team to help with  gait training and muscle strengthening exercises.fall precautions. Skin care. Encourage to be out of bed.   Surgical wound infection S/p iv antibiotic and wound care in hospital. Continue ciprofloxacin 500 mg q12h until 08/03/16. Continue wound care with dressing change bid. Need follow up with surgery  sweat rash To her pannus. S/p 2 dosing of fluconazole in hospital. Continue interdry to the pannus area to help absorb moisture. Continue to provide skin care  Small bowel obstruction S/p ex lap and adhesion lysis. Will need follow up with surgery. Monitor for bowel movement. Continue norco 5-325 mg 1-2 tab q4h prn pain and tylenol  500 mg 2 tab q8h prn pain.   Neuropathic pain Continue gabapentin 300 mg bid  HTN Monitor BP, continue hydralazine 25 mg tid, clonidine 0.1 mg tid and lopressor 50 mg bid. Check bmp  Blood loss anemia Post op, monitor cbc  gerd Continue protonix  History of pressure ulcer Resolved. Continue to provide pressure ulcer prophylaxis and preventative dressing. Continue decubivite.   Chronic depression Stable mood at present. Continue paroxetine  Chronic back pain On norco 5-325 mg 1-2 tab q4h prn pain and tylenol as needed. Monitor clinically. D/c diclofenac sodium.   Goals of care: short term rehabilitation    Labs/tests ordered: cbc, cmp 08/06/16   Family/ staff Communication: reviewed care plan with patient and nursing supervisor    Blanchie Serve, MD Internal Medicine [place], [place] [address] Cell Phone (Monday-Friday 8 am - 5 pm): [phone] On Call: [phone] and follow prompts after 5 pm and on weekends Office Phone: [phone] Office Fax: [phone]

## 2016-08-03 ENCOUNTER — Telehealth: Payer: Self-pay

## 2016-08-03 NOTE — Telephone Encounter (Signed)
Possible re-admission to facility. This is a patient you were seeing at [facility] . [facility] F/U is needed if patient was re-admitted to facility upon discharge. Hospital discharge from [facility] on 08/01/16.

## 2016-08-06 LAB — BASIC METABOLIC PANEL
BUN: 10 mg/dL (ref 4–21)
Creatinine: 0.7 mg/dL (ref 0.5–1.1)
Glucose: 102 mg/dL
Potassium: 4 mmol/L (ref 3.4–5.3)
Sodium: 142 mmol/L (ref 137–147)

## 2016-08-06 LAB — CBC AND DIFFERENTIAL
HCT: 34 % — AB (ref 36–46)
Hemoglobin: 11.1 g/dL — AB (ref 12.0–16.0)
Platelets: 332 10*3/uL (ref 150–399)
WBC: 4.5 10^3/mL

## 2016-08-06 LAB — HEPATIC FUNCTION PANEL
ALT: 10 U/L (ref 7–35)
AST: 15 U/L (ref 13–35)
Alkaline Phosphatase: 91 U/L (ref 25–125)
Bilirubin, Total: 0.3 mg/dL

## 2016-08-09 ENCOUNTER — Ambulatory Visit: Payer: Medicare Other | Admitting: Pain Medicine

## 2016-08-13 ENCOUNTER — Ambulatory Visit: Payer: Medicare Other | Admitting: Pain Medicine

## 2016-08-20 ENCOUNTER — Other Ambulatory Visit: Payer: Self-pay

## 2016-08-20 MED ORDER — HYDROCODONE-ACETAMINOPHEN 5-325 MG PO TABS: 1.0000 | ORAL_TABLET | ORAL | 0 refills | Status: DC | PRN

## 2016-08-20 NOTE — Telephone Encounter (Signed)
Rx faxed to [facility] @ [phone], phone number [phone]  

## 2016-08-27 IMAGING — CT CT L SPINE W/O CM
4 of 8 series · 13 of 33 positions shown, 15 images · non-contrast
Comparison: MRI dated 02/24/2016 and CT scans dated 01/30/2016 and
12/28/2015

CLINICAL DATA: Low back pain and bilateral leg pain. Multiple prior
lumbar surgeries.

EXAM:
CT LUMBAR SPINE WITHOUT CONTRAST
TECHNIQUE: Multidetector CT imaging of the lumbar spine was performed without
intravenous contrast administration. Multiplanar CT image
reconstructions were also generated.

[Series 4: l spine bone · axial · 0.31mm/px · z∈[-35,+48]mm · 2 of 100 slices shown]
[im 34/100  bone]
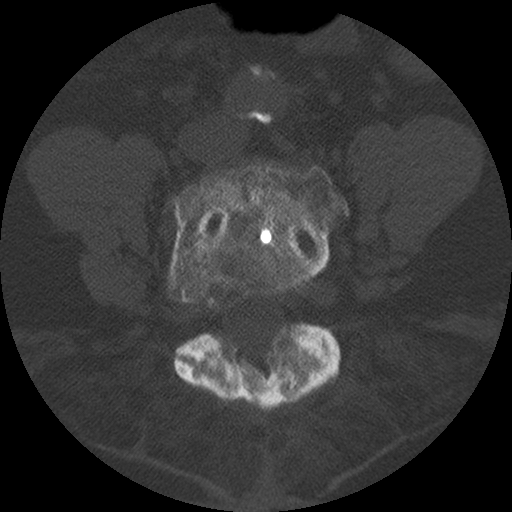
[im 67/100  bone]
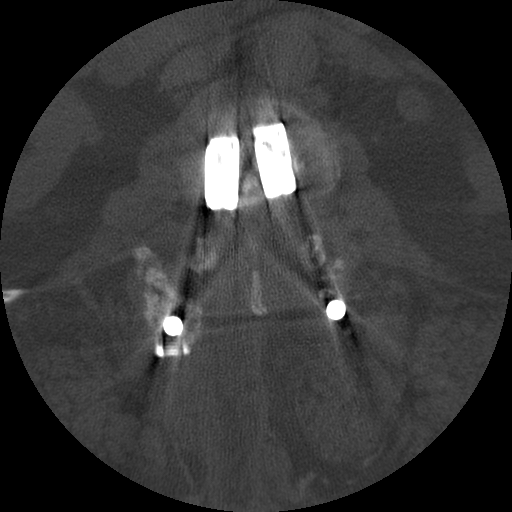

[Series 5: l-spine detail · axial · 0.31mm/px · z∈[-58,+68]mm · 3 of 100 slices shown, 4 images]
[im 25/100  soft-tissue]
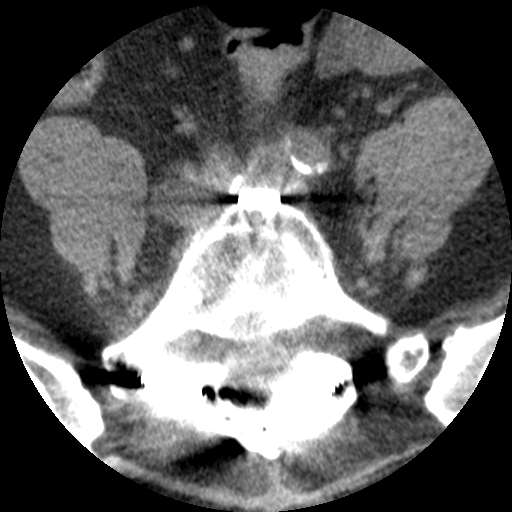
[im 25/100  bone]
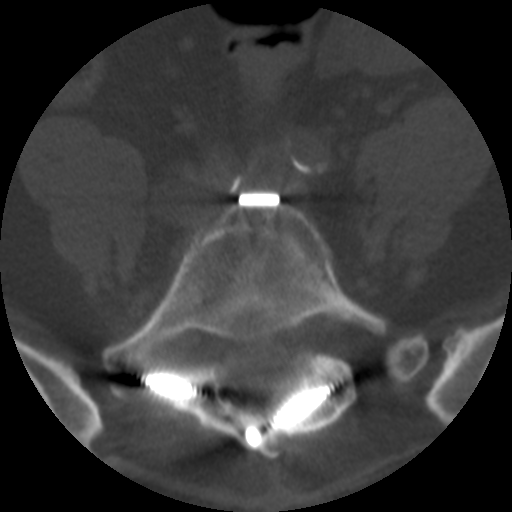
[im 50/100  bone]
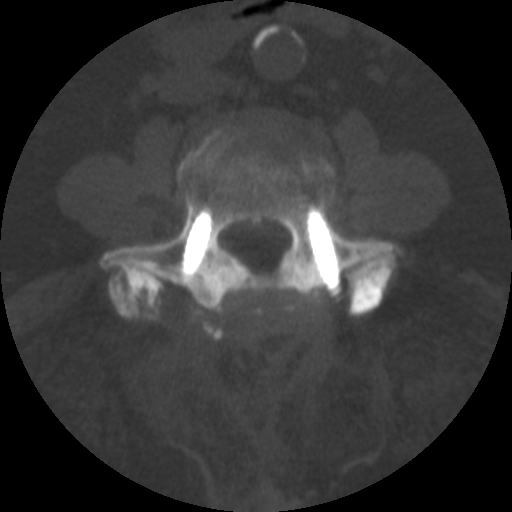
[im 75/100  bone]
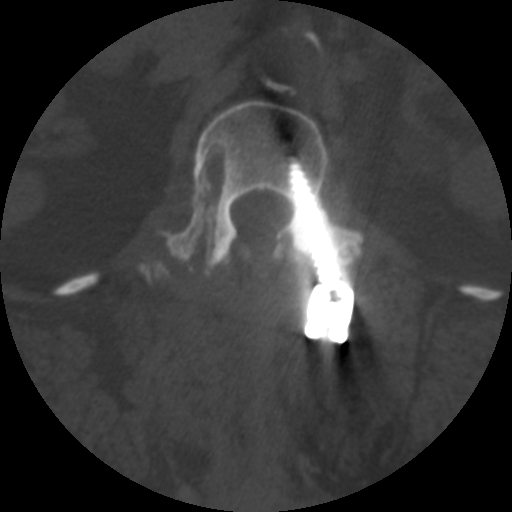

[Series 200: coronal · coronal · 0.50mm/px · 3 of 61 slices shown]
[im 13/61  bone]
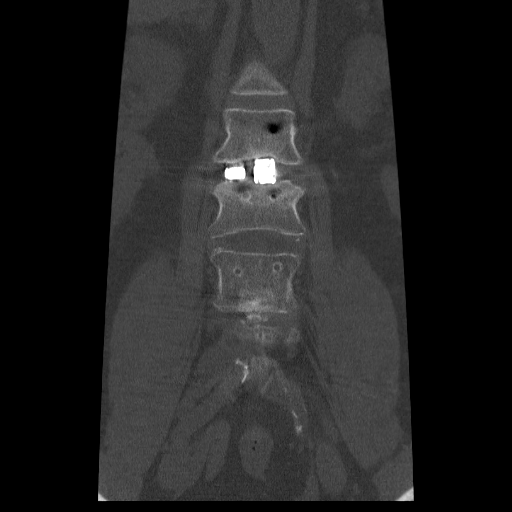
[im 25/61  bone]
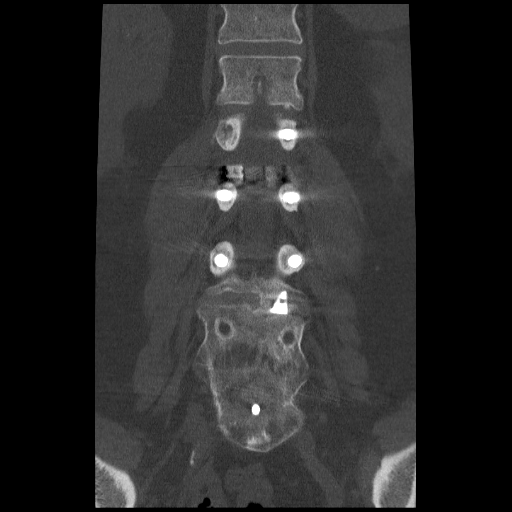
[im 37/61  bone]
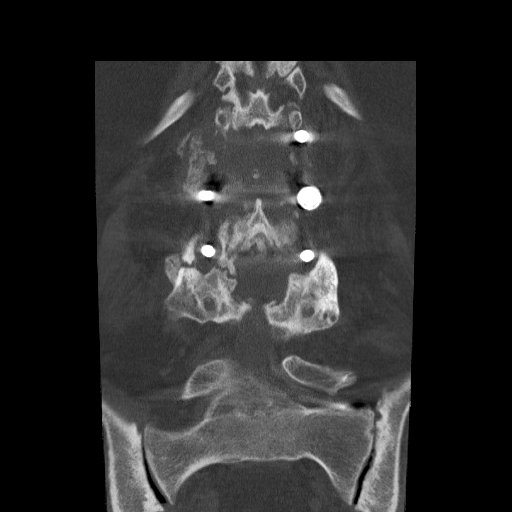

[Series 201: sagittal · sagittal · 0.50mm/px · 5 of 61 slices shown, 6 images]
[im 21/61  bone]
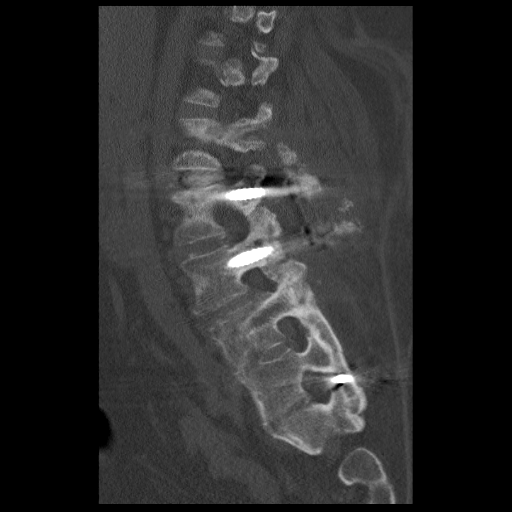
[im 26/61  bone]
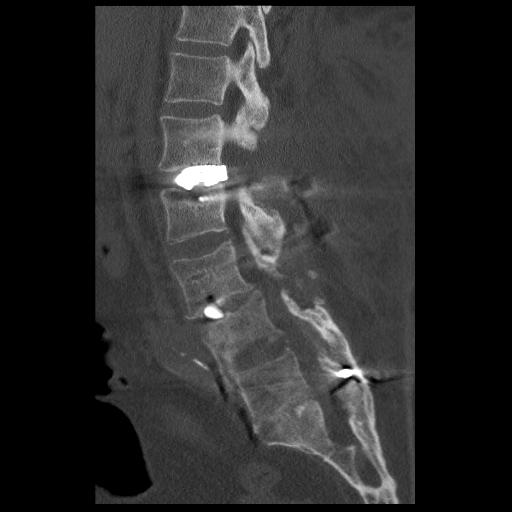
[im 31/61  soft-tissue]
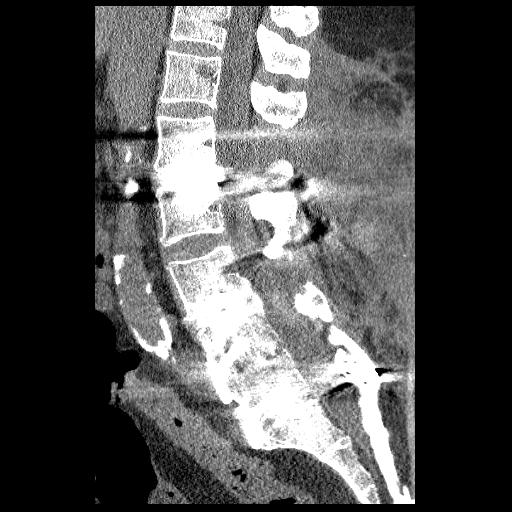
[im 31/61  bone]
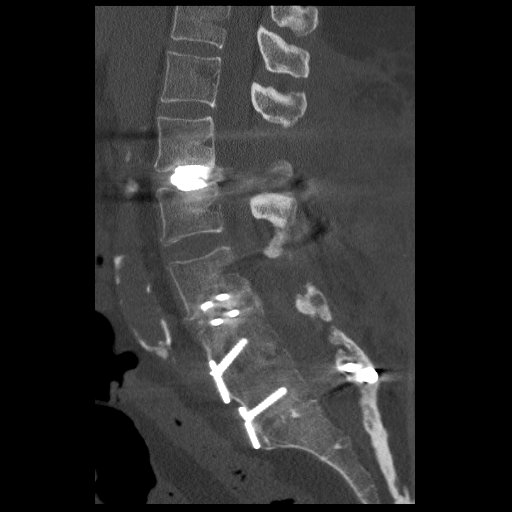
[im 36/61  bone]
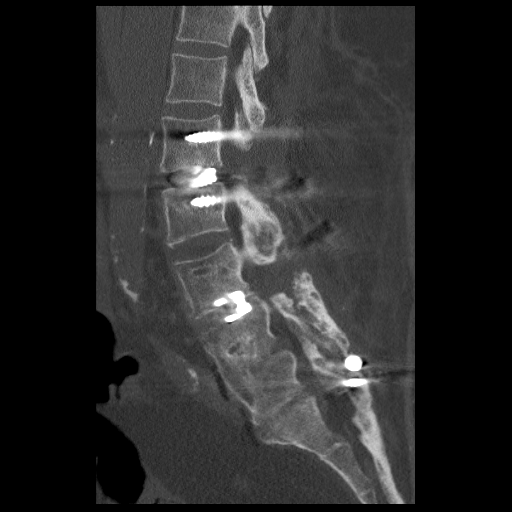
[im 41/61  bone]
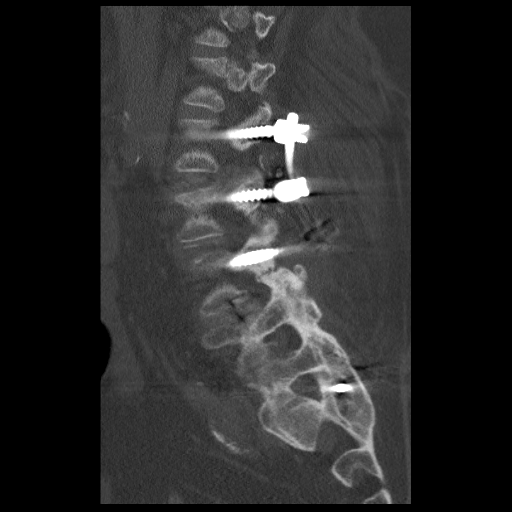

[13 of 33 positions shown; findings below may reference images not displayed]

FINDINGS: T11-12: Normal.

T12-L1: Minimal degenerative changes of the facet joints, stable.
Normal disc.

L1-2: Right pedicle screw has been removed from L1. There now is a
small amount of solid posterior bone fusion to the right. Interbody
fusion devices appear in good position, unchanged. Excellent
posterior decompression.

L2-3 through L5-S1: Solid posterior fusion at L2-3 in solid
interbody fusion at L3-4 and solid interbody and posterior fusion at
L4-5 and L5-S1. The appearance at these levels is unchanged.

The hardware appears in good position and intact.

Incidental note is made of extensive aortic atherosclerosis.
IMPRESSION: No acute abnormalities.  Solid fusion from L1 through S1.

Bilateral facet arthritis at T12-L1, stable.

## 2016-08-29 ENCOUNTER — Encounter: Payer: Self-pay | Admitting: Family

## 2016-08-29 ENCOUNTER — Non-Acute Institutional Stay (SKILLED_NURSING_FACILITY): Payer: Medicare Other | Admitting: Family

## 2016-08-29 DIAGNOSIS — F329 Major depressive disorder, single episode, unspecified: Secondary | ICD-10-CM | POA: Diagnosis not present

## 2016-08-29 DIAGNOSIS — F32A Depression, unspecified: Secondary | ICD-10-CM

## 2016-08-29 DIAGNOSIS — Z789 Other specified health status: Secondary | ICD-10-CM | POA: Diagnosis not present

## 2016-08-29 DIAGNOSIS — M545 Low back pain, unspecified: Secondary | ICD-10-CM

## 2016-08-29 DIAGNOSIS — I10 Essential (primary) hypertension: Secondary | ICD-10-CM | POA: Diagnosis not present

## 2016-08-29 DIAGNOSIS — G8929 Other chronic pain: Secondary | ICD-10-CM | POA: Diagnosis not present

## 2016-08-29 DIAGNOSIS — M792 Neuralgia and neuritis, unspecified: Secondary | ICD-10-CM | POA: Diagnosis not present

## 2016-08-29 DIAGNOSIS — K219 Gastro-esophageal reflux disease without esophagitis: Secondary | ICD-10-CM

## 2016-08-29 DIAGNOSIS — M961 Postlaminectomy syndrome, not elsewhere classified: Secondary | ICD-10-CM | POA: Diagnosis not present

## 2016-08-29 NOTE — Progress Notes (Signed)
Location:  [place] Room Number: 1206-P Place of Service:  SNF (31) Provider:  Dinah Ngetich FNP-C   Blanchie Serve, MD  Patient Care Team: Blanchie Serve, MD as PCP - General (Internal Medicine)  Extended Emergency Contact Information Primary Emergency Contact: [name] Address: [address]          [place], [place] [address] [name] of [place] Phone: [phone] Mobile Phone: [phone] Relation: Spouse Secondary Emergency Contact: [name] Address: [address]          [place], [place] [address] [name] of [place] Phone: [phone] Mobile Phone: [phone] Relation: Daughter  Code Status:  DNR  Goals of care: Advanced Directive information Advanced Directives 07/21/2016  Does Patient Have a Medical Advance Directive? Yes  Type of Advance Directive Living will;Out of facility DNR (pink MOST or yellow form)  Does patient want to make changes to medical advance directive? -  Copy of [place] in Chart? No - copy requested  Would patient like information on creating a medical advance directive? -  Pre-existing out of facility DNR order (yellow form or pink MOST form) -     Chief Complaint  Patient presents with  . Medical Management of Chronic Issues    Routine Visit    HPI:  Pt is a 70 y.o. female seen today at [facility]  for medical management of chronic diseases.She has a medical history of HTN, OSA, GERD,OA, chronic back pain, Neuropathy among other conditions. She is seen in her room today.She complains of her lower back pain urine specimen already obtained for U/A and C/S. She states abdominal pain incision drainage decreased this visit. Dressing changed by facility Nurse prior to visit. Of note she is post hospital re-admission from 07/20/16-08/01/16 with wound infection of surgical incision treated with I.V. Antibiotics. She was here for STR post  hospital admission from 07/02/16-07/16/16 with small bowel obstruction status post exploratory laprotomy and lysis of adhesions. She was seen by [facility] 08/23/2016 continue wet to dry drsg twice daily and pack tunnel. She is to follow up with [facility] in 4 weeks. She continues to exercise with PT/OT.She denies any fever, cough or chills. Facility Nurse reports no new concerns.    Past Medical History:  Diagnosis Date  . Abdominal pain of unknown etiology 02/05/2016  . Anxiety   . Arthritis    ddd- RA  . Asthma    "sleeping asthma"  . Chronic back pain    lumbar steroid injection's  . Constipation   . Depression   . Dysphonia    intermittent "voice changes"  . Fibromyalgia   . GERD (gastroesophageal reflux disease)   . Glaucoma   . Headache   . History of DVT (deep vein thrombosis) 1989   LEFT UPPER ARM  . History of hiatal hernia   . History of MRSA infection 2011  . Hypertension   . Irregular heart rate    "years ago"  . Low iron   . Mild obstructive sleep apnea    per study 02-07-2006 - no cpap  . Neuropathy (HCC)    feet  . Pelvic pain in female   . Peripheral vascular disease ([place])    "poor circulation"  . SBO (small bowel obstruction) 06/2016  . Seasonal allergies   . Short of breath on exertion   . Urinary frequency   . Weakness    both hands and feet  Past Surgical History:  Procedure Laterality Date  . abdominal adhesions removed    . ABDOMINAL HYSTERECTOMY  1978  . BUNIONECTOMY Left 2008  . COLONOSCOPY    . CYSTO WITH HYDRODISTENSION N/A 07/12/2015   Procedure: CYSTOSCOPY/HYDRODISTENSION;  Surgeon: Bjorn Loser, MD;  Location: [facility];  Service: Urology;  Laterality: N/A;  . ESOPHAGEAL MANOMETRY N/A 11/22/2014   Procedure: ESOPHAGEAL MANOMETRY (EM);  Surgeon: Winfield Cunas., MD;  Location: [facility];  Service: Endoscopy;  Laterality: N/A;  . ESOPHAGOGASTRODUODENOSCOPY N/A 07/15/2013    Procedure: ESOPHAGOGASTRODUODENOSCOPY (EGD);  Surgeon: Winfield Cunas., MD;  Location: [facility];  Service: Endoscopy;  Laterality: N/A;  need xray  . EYE SURGERY Bilateral    cataract surgery with lens implants  . HARDWARE REMOVAL N/A 01/02/2016   Procedure: Exploration of Lumbar Fusion,Removal of hardware Lumbar One-Two ;Redo Posterior Lumbar Fusion Lumbar One-Two;  Surgeon: Kary Kos, MD;  Location: [place] [facility];  Service: Neurosurgery;  Laterality: N/A;  . LAPAROSCOPIC CHOLECYSTECTOMY  01-11-2006  . LAPAROSCOPY N/A 07/06/2016   Procedure: LAPAROSCOPY DIAGNOSTIC EMERGENT TO OPEN;  Surgeon: Greer Pickerel, MD;  Location: [place];  Service: General;  Laterality: N/A;  . LAPAROTOMY N/A 07/06/2016   Procedure: EXPLORATORY LAPAROTOMY LYSIS OF ADHESIONS FOR 3 HOURS;  Surgeon: Greer Pickerel, MD;  Location: [place];  Service: General;  Laterality: N/A;  . LUMBAR FUSION  2014   L4 -- L5  . MASS EXCISION Left 02/20/2013   Procedure: EXCISION LEFT BUTTOCK  MASS;  Surgeon: Adin Hector, MD;  Location: [facility];  Service: General;  Laterality: Left;  . NOSE SURGERY  2007  . REMOVAL HARDWARE L4-L5/  BILATERAL LAMINECTOMY L2 - L5 AND FUSION  06-12-2011  . SHOULDER OPEN ROTATOR CUFF REPAIR Left 05/05/2014   Procedure: OPEN ACROMIONECTOMY AND OPEN REPAIR OF ROTATOR CUFF, TISSUEMEND GRAFT WITH ANCHOR ;  Surgeon: Tobi Bastos, MD;  Location: [facility];  Service: Orthopedics;  Laterality: Left;  . SPINE SURGERY    . TONSILLECTOMY      Allergies  Allergen Reactions  . Latex Other (See Comments)    Sores, blisters - reaction to gloves  . Penicillins Other (See Comments)    Pt was told she was allergic to penicillin in her mid 20'2 - does not recall the reaction. She has taken amoxicillin and ampicillin with no reaction.       Medication List       Accurate as of 08/29/16 10:57 AM. Always use your most recent med list.          acetaminophen 500 MG tablet Commonly known as:  TYLENOL Take 500 mg by  mouth every 8 (eight) hours as needed for moderate pain.   calcium-vitamin D 500-200 MG-UNIT tablet Commonly known as:  OSCAL WITH D Take 1 tablet by mouth daily with breakfast.   cloNIDine 0.1 MG tablet Commonly known as:  CATAPRES Take 1 tablet (0.1 mg total) by mouth 3 (three) times daily.   DECUBI-VITE PO Take 1 tablet by mouth daily.   estradiol 0.1 MG/GM vaginal cream Commonly known as:  ESTRACE Place 1 Applicatorful vaginally 2 (two) times a week.   feeding supplement (PRO-STAT SUGAR FREE 64) Liqd Take 30 mLs by mouth daily.   fluticasone 50 MCG/ACT nasal spray Commonly known as:  FLONASE Place 1 spray into both nostrils daily.   gabapentin 300 MG capsule Commonly known as:  NEURONTIN Take 300 mg by mouth 2 (two) times daily.   hydrALAZINE  25 MG tablet Commonly known as:  APRESOLINE Take 25 mg by mouth 3 (three) times daily.   HYDROcodone-acetaminophen 5-325 MG tablet Commonly known as:  NORCO/VICODIN Take 1-2 tablets by mouth every 4 (four) hours as needed for moderate pain or severe pain.   ICY HOT EX Apply 1 application topically 2 (two) times daily as needed (pain). Gel   l-methylfolate-B6-B12 3-35-2 MG Tabs tablet Commonly known as:  METANX Take 1 tablet by mouth 2 (two) times daily.   metoprolol 50 MG tablet Commonly known as:  LOPRESSOR Take 1 tablet (50 mg total) by mouth 2 (two) times daily.   ondansetron 4 MG tablet Commonly known as:  ZOFRAN Take 1 tablet (4 mg total) by mouth every 6 (six) hours as needed for nausea.   pantoprazole 40 MG tablet Commonly known as:  PROTONIX Take 40 mg by mouth daily.   PARoxetine 40 MG tablet Commonly known as:  PAXIL Take 1 tablet (40 mg total) by mouth daily.   polyethylene glycol packet Commonly known as:  MIRALAX / GLYCOLAX Take 17 g by mouth daily.   senna-docusate 8.6-50 MG tablet Commonly known as:  Senokot-S Take 2 tablets by mouth 2 (two) times daily.   UNABLE TO FIND Med Name: Med pass 90  mL 2 times daily   VITAMIN B COMPLEX PO Take 1 tablet by mouth daily.   vitamin E 400 UNIT capsule Take 400 Units by mouth daily.       Review of Systems  Constitutional: Negative for activity change, appetite change, chills, fatigue and fever.  HENT: Negative for congestion, rhinorrhea, sinus pressure, sneezing and sore throat.   Eyes: Negative.   Respiratory: Negative for cough, chest tightness, shortness of breath and wheezing.   Cardiovascular: Negative for chest pain, palpitations and leg swelling.  Gastrointestinal: Negative for abdominal distention, abdominal pain, constipation, diarrhea, nausea and vomiting.  Endocrine: Negative.   Genitourinary: Negative for flank pain, frequency and urgency.  Musculoskeletal: Positive for back pain and gait problem.  Skin: Negative for color change, pallor and rash.       Mid -abd surgical drsg changed by facility Nurse prior to visit.   Neurological: Negative for dizziness, syncope, light-headedness and headaches.  Hematological: Does not bruise/bleed easily.  Psychiatric/Behavioral: Negative for agitation, confusion, hallucinations and sleep disturbance. The patient is not nervous/anxious.     Immunization History  Administered Date(s) Administered  . Influenza-Unspecified 06/26/2016  . PPD Test 10/03/2015, 01/06/2016, 01/16/2016, 02/01/2016, 02/15/2016   Pertinent  Health Maintenance Due  Topic Date Due  . COLONOSCOPY  01/23/1996  . DEXA SCAN  01/23/2011  . PNA vac Low Risk Adult (1 of 2 - PCV13) 09/24/2016 (Originally 01/23/2011)  . MAMMOGRAM  11/28/2017  . INFLUENZA VACCINE  Completed   Fall Risk  06/26/2016  Falls in the past year? No   Functional Status Survey:    Vitals:   08/29/16 1046  BP: (!) 162/80  Pulse: 68  Resp: 18  Temp: 98.1 F (36.7 C)  TempSrc: Oral  SpO2: 94%  Weight: 260 lb 6.4 oz (118.1 kg)  Height: 5\' 5"  (1.651 m)   Body mass index is 43.33 kg/m. Physical Exam  Constitutional: She is  oriented to person, place, and time. She appears well-developed and well-nourished. No distress.  pleasant  HENT:  Head: Normocephalic.  Mouth/Throat: Oropharynx is clear and moist. No oropharyngeal exudate.  Speaks in low tone voice   Eyes: Conjunctivae and EOM are normal. Pupils are equal, round, and reactive to  light. Right eye exhibits no discharge. Left eye exhibits no discharge. No scleral icterus.  Neck: Normal range of motion. No JVD present. No thyromegaly present.  Cardiovascular: Normal rate, regular rhythm, normal heart sounds and intact distal pulses.  Exam reveals no gallop and no friction rub.   No murmur heard. Pulmonary/Chest: Effort normal and breath sounds normal. No respiratory distress. She has no wheezes. She has no rales.  Abdominal: Soft. Bowel sounds are normal. She exhibits no distension. There is no tenderness. There is no rebound and no guarding.  Genitourinary:  Genitourinary Comments: Continent   Musculoskeletal: Normal range of motion. She exhibits no edema, tenderness or deformity.  Lymphadenopathy:    She has no cervical adenopathy.  Neurological: She is oriented to person, place, and time.  Skin: Skin is warm and dry. No rash noted. No erythema. No pallor.  Mid- Abdominal Surg drsg dry,clean and intact.   Psychiatric: She has a normal mood and affect.    Labs reviewed:  Recent Labs  07/25/16 0955 07/26/16 0540 07/27/16 0415 07/28/16 0500 08/01/16 0436 08/06/16  NA 140 139 139 138 138 142  K 3.4* 3.1* 3.3* 3.8 3.4* 4.0  CL 104 101 103 101 100*  --   CO2 32 32 31 29 30   --   GLUCOSE 85 107* 116* 96 109*  --   BUN <5* <5* <5* 9 7 10   CREATININE 0.60 0.63 0.57 0.69 0.72 0.7  CALCIUM 8.2* 8.3* 8.3* 8.5* 8.9  --   MG 1.8 1.9 2.2  --   --   --   PHOS 3.0 2.9 3.6  --   --   --     Recent Labs  07/21/16 0011 07/23/16 0602 07/26/16 0540 08/06/16  AST 26 11* 23 15  ALT 28 15 17 10   ALKPHOS 103 85 83 91  BILITOT 0.8 0.4 0.3  --   PROT 6.3*  5.9* 5.7*  --   ALBUMIN 2.5* 2.3* 2.2*  --     Recent Labs  07/03/16 0627  07/11/16 0433  07/21/16 0123  07/26/16 0540 07/28/16 0500 08/01/16 0436 08/06/16  WBC 4.5  < > 11.5*  < > 8.0  < > 5.0 5.6 5.9 4.5  NEUTROABS 3.0  --  9.0*  --  5.9  --   --   --   --   --   HGB 14.5  < > 11.2*  < > 9.7*  < > 9.4* 9.8* 10.6* 11.1*  HCT 44.1  < > 33.5*  < > 29.7*  < > 29.1* 30.2* 32.2* 34*  MCV 91.9  < > 89.8  --  90.8  < > 90.4 90.4 91.5  --   PLT 307  < > 275  < > 583*  < > 464* 416* 314 332  < > = values in this interval not displayed. Lab Results  Component Value Date   TSH 2.825 07/07/2016    Lab Results  Component Value Date   CHOL 165 07/03/2016   HDL 82 07/03/2016   LDLCALC 75 07/03/2016   TRIG 148 07/27/2016   CHOLHDL 2.0 07/03/2016   Assessment/Plan HTN B/p stable. Continue on Hydralazine, clonidine and metoprolol. Monitor BMP   Chronic lower back pain  Continue current pain regimen. PMR consult for pain management.   Neurogenic pain  Continue on Gabapentin.   GERD Continue on Protonix.  Depression  Stable. Continue on Paxil. Monitor for mood changes.   Abd surgical incision Continue wet-dry drsg changes  per [facility]. Monitor Temp Curve and drainage.   Family/ staff Communication: Reviewed plan of care with Patient and facility Nurse supervisor.   Labs/tests ordered: None

## 2016-08-30 DIAGNOSIS — M961 Postlaminectomy syndrome, not elsewhere classified: Secondary | ICD-10-CM | POA: Diagnosis not present

## 2016-09-03 DIAGNOSIS — M961 Postlaminectomy syndrome, not elsewhere classified: Secondary | ICD-10-CM | POA: Diagnosis not present

## 2016-09-12 DIAGNOSIS — M961 Postlaminectomy syndrome, not elsewhere classified: Secondary | ICD-10-CM | POA: Diagnosis not present

## 2016-09-14 ENCOUNTER — Non-Acute Institutional Stay (SKILLED_NURSING_FACILITY): Payer: Medicare Other | Admitting: Family

## 2016-09-14 DIAGNOSIS — R1031 Right lower quadrant pain: Secondary | ICD-10-CM | POA: Diagnosis not present

## 2016-09-14 DIAGNOSIS — M545 Low back pain, unspecified: Secondary | ICD-10-CM

## 2016-09-14 DIAGNOSIS — R1032 Left lower quadrant pain: Secondary | ICD-10-CM

## 2016-09-14 LAB — BASIC METABOLIC PANEL
BUN: 13 mg/dL (ref 4–21)
Creatinine: 0.7 mg/dL (ref 0.5–1.1)
Glucose: 106 mg/dL
Potassium: 4.1 mmol/L (ref 3.4–5.3)
Sodium: 137 mmol/L (ref 137–147)

## 2016-09-14 LAB — CBC AND DIFFERENTIAL
HCT: 35 % — AB (ref 36–46)
Hemoglobin: 11.3 g/dL — AB (ref 12.0–16.0)
Neutrophils Absolute: 2372 /uL
Platelets: 341 10*3/uL (ref 150–399)
WBC: 5.1 10^3/mL

## 2016-09-14 NOTE — Progress Notes (Signed)
Location:  [place] Room Number: 1206  Place of Service:  SNF (31) Provider: Dinah Ngetich FNP-C   Blanchie Serve, MD  Patient Care Team: Blanchie Serve, MD as PCP - General (Internal Medicine)  Extended Emergency Contact Information Primary Emergency Contact: [name] Address: [address]          [place], [place] [address] [name] of [place] Phone: [phone] Mobile Phone: [phone] Relation: Spouse Secondary Emergency Contact: [name] Address: [address]          [place], [place] [address] [name] of [place] Phone: [phone] Mobile Phone: [phone] Relation: Daughter  Code Status: DNR  Goals of care: Advanced Directive information Advanced Directives 07/21/2016  Does Patient Have a Medical Advance Directive? Yes  Type of Advance Directive Living will;Out of facility DNR (pink MOST or yellow form)  Does patient want to make changes to medical advance directive? -  Copy of Edith Endave in Chart? No - copy requested  Would patient like information on creating a medical advance directive? -  Pre-existing out of facility DNR order (yellow form or pink MOST form) -     Chief Complaint  Patient presents with  . Acute Visit    abdominal /back pain     HPI:  Pt is a 70 y.o. female seen today at [facility] for an acute visit for evaluation of lower abdominal pain. She is seen in her room today per facility Nurse supervisor request. She complains of lower abdominal pain radiating to her lower back.she denies any fever, chills, Nausea, vomiting or diarrhea.Of note she is status postexploratory laprotomy and lysis of adhesions. Mid abdominal incision drsg managed by wound care Nurse no drainage noted. Her Temp is 99.0 this visit.    Past Medical History:  Diagnosis Date  . Abdominal pain of unknown etiology 02/05/2016  . Anxiety   . Arthritis    ddd- RA  .  Asthma    "sleeping asthma"  . Chronic back pain    lumbar steroid injection's  . Constipation   . Depression   . Dysphonia    intermittent "voice changes"  . Fibromyalgia   . GERD (gastroesophageal reflux disease)   . Glaucoma   . Headache   . History of DVT (deep vein thrombosis) 1989   LEFT UPPER ARM  . History of hiatal hernia   . History of MRSA infection 2011  . Hypertension   . Irregular heart rate    "years ago"  . Low iron   . Mild obstructive sleep apnea    per study 02-07-2006 - no cpap  . Neuropathy (HCC)    feet  . Pelvic pain in female   . Peripheral vascular disease ([place])    "poor circulation"  . SBO (small bowel obstruction) 06/2016  . Seasonal allergies   . Short of breath on exertion   . Urinary frequency   . Weakness    both hands and feet   Past Surgical History:  Procedure Laterality Date  . abdominal adhesions removed    . ABDOMINAL HYSTERECTOMY  1978  . BUNIONECTOMY Left 2008  . COLONOSCOPY    . CYSTO WITH HYDRODISTENSION N/A 07/12/2015   Procedure: CYSTOSCOPY/HYDRODISTENSION;  Surgeon: Bjorn Loser, MD;  Location: [facility];  Service: Urology;  Laterality: N/A;  . ESOPHAGEAL MANOMETRY N/A 11/22/2014   Procedure: ESOPHAGEAL MANOMETRY (EM);  Surgeon: Winfield Cunas., MD;  Location: [facility];  Service: Endoscopy;  Laterality: N/A;  . ESOPHAGOGASTRODUODENOSCOPY N/A 07/15/2013   Procedure: ESOPHAGOGASTRODUODENOSCOPY (EGD);  Surgeon: Winfield Cunas., MD;  Location: [facility];  Service: Endoscopy;  Laterality: N/A;  need xray  . EYE SURGERY Bilateral    cataract surgery with lens implants  . HARDWARE REMOVAL N/A 01/02/2016   Procedure: Exploration of Lumbar Fusion,Removal of hardware Lumbar One-Two ;Redo Posterior Lumbar Fusion Lumbar One-Two;  Surgeon: Kary Kos, MD;  Location: [facility];  Service: Neurosurgery;  Laterality: N/A;  . LAPAROSCOPIC CHOLECYSTECTOMY  01-11-2006  . LAPAROSCOPY N/A 07/06/2016    Procedure: LAPAROSCOPY DIAGNOSTIC EMERGENT TO OPEN;  Surgeon: Greer Pickerel, MD;  Location: [facility];  Service: General;  Laterality: N/A;  . LAPAROTOMY N/A 07/06/2016   Procedure: EXPLORATORY LAPAROTOMY LYSIS OF ADHESIONS FOR 3 HOURS;  Surgeon: Greer Pickerel, MD;  Location: [facility];  Service: General;  Laterality: N/A;  . LUMBAR FUSION  2014   L4 -- L5  . MASS EXCISION Left 02/20/2013   Procedure: EXCISION LEFT BUTTOCK  MASS;  Surgeon: Adin Hector, MD;  Location: [facility];  Service: General;  Laterality: Left;  . NOSE SURGERY  2007  . REMOVAL HARDWARE L4-L5/  BILATERAL LAMINECTOMY L2 - L5 AND FUSION  06-12-2011  . SHOULDER OPEN ROTATOR CUFF REPAIR Left 05/05/2014   Procedure: OPEN ACROMIONECTOMY AND OPEN REPAIR OF ROTATOR CUFF, TISSUEMEND GRAFT WITH ANCHOR ;  Surgeon: Tobi Bastos, MD;  Location: [facility];  Service: Orthopedics;  Laterality: Left;  . SPINE SURGERY    . TONSILLECTOMY      Allergies  Allergen Reactions  . Latex Other (See Comments)    Sores, blisters - reaction to gloves  . Penicillins Other (See Comments)    Pt was told she was allergic to penicillin in her mid 20'2 - does not recall the reaction. She has taken amoxicillin and ampicillin with no reaction.     Allergies as of 09/14/2016      Reactions   Latex Other (See Comments)   Sores, blisters - reaction to gloves   Penicillins Other (See Comments)   Pt was told she was allergic to penicillin in her mid 20'2 - does not recall the reaction. She has taken amoxicillin and ampicillin with no reaction.      Medication List       Accurate as of 09/14/16  2:24 PM. Always use your most recent med list.          acetaminophen 500 MG tablet Commonly known as:  TYLENOL Take 500 mg by mouth every 8 (eight) hours as needed for moderate pain.   calcium-vitamin D 500-200 MG-UNIT tablet Commonly known as:  OSCAL WITH D Take 1 tablet by mouth daily with breakfast.   cloNIDine 0.1 MG tablet Commonly known as:   CATAPRES Take 1 tablet (0.1 mg total) by mouth 3 (three) times daily.   DECUBI-VITE PO Take 1 tablet by mouth daily.   estradiol 0.1 MG/GM vaginal cream Commonly known as:  ESTRACE Place 1 Applicatorful vaginally 2 (two) times a week.   feeding supplement (PRO-STAT SUGAR FREE 64) Liqd Take 30 mLs by mouth daily.   fluticasone 50 MCG/ACT nasal spray Commonly known as:  FLONASE Place 1 spray into both nostrils daily.   gabapentin 300 MG capsule Commonly known as:  NEURONTIN Take 300 mg by mouth 2 (two) times daily.   hydrALAZINE 25 MG tablet Commonly known as:  APRESOLINE Take 25 mg by mouth 3 (three) times daily.  HYDROcodone-acetaminophen 5-325 MG tablet Commonly known as:  NORCO/VICODIN Take 1-2 tablets by mouth every 4 (four) hours as needed for moderate pain or severe pain.   ICY HOT EX Apply 1 application topically 2 (two) times daily as needed (pain). Gel   l-methylfolate-B6-B12 3-35-2 MG Tabs tablet Commonly known as:  METANX Take 1 tablet by mouth 2 (two) times daily.   metoprolol 50 MG tablet Commonly known as:  LOPRESSOR Take 1 tablet (50 mg total) by mouth 2 (two) times daily.   ondansetron 4 MG tablet Commonly known as:  ZOFRAN Take 1 tablet (4 mg total) by mouth every 6 (six) hours as needed for nausea.   pantoprazole 40 MG tablet Commonly known as:  PROTONIX Take 40 mg by mouth daily.   PARoxetine 40 MG tablet Commonly known as:  PAXIL Take 1 tablet (40 mg total) by mouth daily.   polyethylene glycol packet Commonly known as:  MIRALAX / GLYCOLAX Take 17 g by mouth daily.   senna-docusate 8.6-50 MG tablet Commonly known as:  Senokot-S Take 2 tablets by mouth 2 (two) times daily.   UNABLE TO FIND Med Name: Med pass 90 mL 2 times daily   VITAMIN B COMPLEX PO Take 1 tablet by mouth daily.   vitamin E 400 UNIT capsule Take 400 Units by mouth daily.       Review of Systems  Constitutional: Negative for activity change, appetite change,  chills, fatigue and fever.  HENT: Negative for congestion, rhinorrhea, sinus pressure, sneezing and sore throat.   Eyes: Negative.   Respiratory: Negative for cough, chest tightness, shortness of breath and wheezing.   Cardiovascular: Negative for chest pain, palpitations and leg swelling.  Gastrointestinal: Negative for abdominal distention, constipation, diarrhea, nausea and vomiting.       Lower abdominal pain   Genitourinary: Positive for flank pain. Negative for difficulty urinating, dysuria, frequency and urgency.  Musculoskeletal: Positive for back pain and gait problem.  Skin: Negative for color change, pallor and rash.       Mid -abd surgical drsg managed by facility wound care Nurse  Neurological: Negative for dizziness, syncope, light-headedness and headaches.  Psychiatric/Behavioral: Negative for agitation, confusion, hallucinations and sleep disturbance. The patient is not nervous/anxious.     Immunization History  Administered Date(s) Administered  . Influenza-Unspecified 06/26/2016  . PPD Test 10/03/2015, 01/06/2016, 01/16/2016, 02/01/2016, 02/15/2016   Pertinent  Health Maintenance Due  Topic Date Due  . COLONOSCOPY  01/23/1996  . DEXA SCAN  01/23/2011  . PNA vac Low Risk Adult (1 of 2 - PCV13) 09/24/2016 (Originally 01/23/2011)  . MAMMOGRAM  11/28/2017  . INFLUENZA VACCINE  Completed   Fall Risk  06/26/2016  Falls in the past year? No      Vitals:   09/14/16 1130  BP: (!) 157/77  Pulse: 71  Resp: 20  Temp: 99 F (37.2 C)  SpO2: 93%  Weight: 260 lb 6.4 oz (118.1 kg)  Height: 5\' 5"  (1.651 m)   Body mass index is 43.33 kg/m. Physical Exam  Constitutional: She is oriented to person, place, and time. She appears well-developed and well-nourished. No distress.  HENT:  Head: Normocephalic.  Mouth/Throat: Oropharynx is clear and moist. No oropharyngeal exudate.  Eyes: Conjunctivae and EOM are normal. Pupils are equal, round, and reactive to light. Right eye  exhibits no discharge. Left eye exhibits no discharge. No scleral icterus.  Neck: Normal range of motion. No JVD present. No thyromegaly present.  Cardiovascular: Normal rate, regular rhythm, normal  heart sounds and intact distal pulses.  Exam reveals no gallop and no friction rub.   No murmur heard. Pulmonary/Chest: Effort normal and breath sounds normal. No respiratory distress. She has no wheezes. She has no rales.  Abdominal: Soft. Bowel sounds are normal. She exhibits no distension. There is no tenderness. There is no rebound and no guarding.  Genitourinary:  Genitourinary Comments: Continent   Musculoskeletal: Normal range of motion. She exhibits no edema, tenderness or deformity.  Lymphadenopathy:    She has no cervical adenopathy.  Neurological: She is oriented to person, place, and time.  Skin: Skin is warm and dry. No rash noted. No erythema. No pallor.  Mid- Abdominal Surg drsg dry,clean and intact.   Psychiatric: She has a normal mood and affect.    Labs reviewed:  Recent Labs  07/25/16 0955 07/26/16 0540 07/27/16 0415 07/28/16 0500 08/01/16 0436 08/06/16  NA 140 139 139 138 138 142  K 3.4* 3.1* 3.3* 3.8 3.4* 4.0  CL 104 101 103 101 100*  --   CO2 32 32 31 29 30   --   GLUCOSE 85 107* 116* 96 109*  --   BUN <5* <5* <5* 9 7 10   CREATININE 0.60 0.63 0.57 0.69 0.72 0.7  CALCIUM 8.2* 8.3* 8.3* 8.5* 8.9  --   MG 1.8 1.9 2.2  --   --   --   PHOS 3.0 2.9 3.6  --   --   --     Recent Labs  07/21/16 0011 07/23/16 0602 07/26/16 0540 08/06/16  AST 26 11* 23 15  ALT 28 15 17 10   ALKPHOS 103 85 83 91  BILITOT 0.8 0.4 0.3  --   PROT 6.3* 5.9* 5.7*  --   ALBUMIN 2.5* 2.3* 2.2*  --     Recent Labs  07/03/16 0627  07/11/16 0433  07/21/16 0123  07/26/16 0540 07/28/16 0500 08/01/16 0436 08/06/16  WBC 4.5  < > 11.5*  < > 8.0  < > 5.0 5.6 5.9 4.5  NEUTROABS 3.0  --  9.0*  --  5.9  --   --   --   --   --   HGB 14.5  < > 11.2*  < > 9.7*  < > 9.4* 9.8* 10.6* 11.1*   HCT 44.1  < > 33.5*  < > 29.7*  < > 29.1* 30.2* 32.2* 34*  MCV 91.9  < > 89.8  --  90.8  < > 90.4 90.4 91.5  --   PLT 307  < > 275  < > 583*  < > 464* 416* 314 332  < > = values in this interval not displayed. Lab Results  Component Value Date   TSH 2.825 07/07/2016   No results found for: HGBA1C Lab Results  Component Value Date   CHOL 165 07/03/2016   HDL 82 07/03/2016   LDLCALC 75 07/03/2016   TRIG 148 07/27/2016   CHOLHDL 2.0 07/03/2016    Assessment/Plan 1. Acute bilateral lower abdominal pain Temp 99.0 ordering urine specimen for U/A and C/S rule UTI. Obtain stat CBC/diff, BMP today.    2. Acute bilateral low back pain without sciatica Radiation from lower abdominal pain. Suspect possible UTI/ chronic pain. Urine specimen per above order.     Family/ staff Communication: Reviewed plan of care with patient and facility Nurse supervisor.   Labs/tests ordered:   urine specimen for U/A and C/S rule UTI.

## 2016-09-18 LAB — CBC AND DIFFERENTIAL
HCT: 37 % (ref 36–46)
Hemoglobin: 11.6 g/dL — AB (ref 12.0–16.0)
Neutrophils Absolute: 2 /uL
Platelets: 316 10*3/uL (ref 150–399)
WBC: 5.1 10^3/mL

## 2016-09-20 DIAGNOSIS — M961 Postlaminectomy syndrome, not elsewhere classified: Secondary | ICD-10-CM | POA: Diagnosis not present

## 2016-09-21 ENCOUNTER — Other Ambulatory Visit: Payer: Self-pay | Admitting: *Deleted

## 2016-09-21 MED ORDER — HYDROCODONE-ACETAMINOPHEN 5-325 MG PO TABS: ORAL_TABLET | ORAL | 0 refills | Status: DC

## 2016-09-21 NOTE — Telephone Encounter (Signed)
[facility] [phone] Fax: [phone]  

## 2016-09-27 DIAGNOSIS — M961 Postlaminectomy syndrome, not elsewhere classified: Secondary | ICD-10-CM | POA: Diagnosis not present

## 2016-10-01 ENCOUNTER — Non-Acute Institutional Stay (SKILLED_NURSING_FACILITY): Payer: Medicare Other | Admitting: Family

## 2016-10-01 DIAGNOSIS — M5441 Lumbago with sciatica, right side: Secondary | ICD-10-CM | POA: Diagnosis not present

## 2016-10-01 DIAGNOSIS — G8929 Other chronic pain: Secondary | ICD-10-CM | POA: Diagnosis not present

## 2016-10-01 DIAGNOSIS — G5792 Unspecified mononeuropathy of left lower limb: Secondary | ICD-10-CM | POA: Diagnosis not present

## 2016-10-01 DIAGNOSIS — M961 Postlaminectomy syndrome, not elsewhere classified: Secondary | ICD-10-CM | POA: Diagnosis not present

## 2016-10-01 DIAGNOSIS — I10 Essential (primary) hypertension: Secondary | ICD-10-CM | POA: Diagnosis not present

## 2016-10-01 DIAGNOSIS — G5793 Unspecified mononeuropathy of bilateral lower limbs: Secondary | ICD-10-CM

## 2016-10-01 DIAGNOSIS — G5791 Unspecified mononeuropathy of right lower limb: Secondary | ICD-10-CM

## 2016-10-01 NOTE — Progress Notes (Signed)
Location:  [place] Room Number: 1206  Place of Service:  SNF (31) Provider: Dinah Ngetich FNP-C   Blanchie Serve, MD  Patient Care Team: Blanchie Serve, MD as PCP - General (Internal Medicine)  Extended Emergency Contact Information Primary Emergency Contact: [name] Address: [address]          [place], [place] [address] [name] of [place] Phone: [phone] Mobile Phone: [phone] Relation: Spouse Secondary Emergency Contact: [name] Address: [address]          [place], [place] [address] [name] of [place] Phone: [phone] Mobile Phone: [phone] Relation: Daughter  Code Status: DNR  Goals of care: Advanced Directive information Advanced Directives 07/21/2016  Does Patient Have a Medical Advance Directive? Yes  Type of Advance Directive Living will;Out of facility DNR (pink MOST or yellow form)  Does patient want to make changes to medical advance directive? -  Copy of [place] in Chart? No - copy requested  Would patient like information on creating a medical advance directive? -  Pre-existing out of facility DNR order (yellow form or pink MOST form) -     Chief Complaint  Patient presents with  . Medical Management of Chronic Issues    HPI:  Pt is a 71 y.o. female seen today at [facility] for medical management of chronic diseases.  She has a medical history of HTN,GERD, OA,Depression, chronic pain among other conditions. She is seen her room today. She complains of worsening lower back pain with radiation to right mid thigh. She is currently on Norco 5/325 mg Tablet 1-2 tablets every 4 hours PRN. She was seen by facility pain management specialist this visit Meloxicam 7.5 mg Tablet daily added.Her Lidoderm patch were discontinued states ineffective.She has completed skilled  Physical therapy now continues to exercise with restorative therapy.Her  abdominal wound managed by wound Nurse now progressing well.Her recent U/A and C/S results negative for UTI.Facility Nurse reports patient's blood pressure ranging in the 130's/70's -180's /90's.She denies any fever, chills ,Headache,chest pain, dizziness, weight changes or fall episodes.    Past Medical History:  Diagnosis Date  . Abdominal pain of unknown etiology 02/05/2016  . Anxiety   . Arthritis    ddd- RA  . Asthma    "sleeping asthma"  . Chronic back pain    lumbar steroid injection's  . Constipation   . Depression   . Dysphonia    intermittent "voice changes"  . Fibromyalgia   . GERD (gastroesophageal reflux disease)   . Glaucoma   . Headache   . History of DVT (deep vein thrombosis) 1989   LEFT UPPER ARM  . History of hiatal hernia   . History of MRSA infection 2011  . Hypertension   . Irregular heart rate    "years ago"  . Low iron   . Mild obstructive sleep apnea    per study 02-07-2006 - no cpap  . Neuropathy (HCC)    feet  . Pelvic pain in female   . Peripheral vascular disease ([place])    "poor circulation"  . SBO (small bowel obstruction) 06/2016  . Seasonal allergies   . Short of breath on exertion   . Urinary frequency   . Weakness    both hands and feet   Past Surgical History:  Procedure Laterality Date  . abdominal adhesions removed    . ABDOMINAL HYSTERECTOMY  1978  . BUNIONECTOMY Left 2008  .  COLONOSCOPY    . CYSTO WITH HYDRODISTENSION N/A 07/12/2015   Procedure: CYSTOSCOPY/HYDRODISTENSION;  Surgeon: Bjorn Loser, MD;  Location: [facility];  Service: Urology;  Laterality: N/A;  . ESOPHAGEAL MANOMETRY N/A 11/22/2014   Procedure: ESOPHAGEAL MANOMETRY (EM);  Surgeon: Winfield Cunas., MD;  Location: [facility];  Service: Endoscopy;  Laterality: N/A;  . ESOPHAGOGASTRODUODENOSCOPY N/A 07/15/2013   Procedure: ESOPHAGOGASTRODUODENOSCOPY (EGD);  Surgeon: Winfield Cunas., MD;  Location: [facility];  Service: Endoscopy;   Laterality: N/A;  need xray  . EYE SURGERY Bilateral    cataract surgery with lens implants  . HARDWARE REMOVAL N/A 01/02/2016   Procedure: Exploration of Lumbar Fusion,Removal of hardware Lumbar One-Two ;Redo Posterior Lumbar Fusion Lumbar One-Two;  Surgeon: Kary Kos, MD;  Location: [facility];  Service: Neurosurgery;  Laterality: N/A;  . LAPAROSCOPIC CHOLECYSTECTOMY  01-11-2006  . LAPAROSCOPY N/A 07/06/2016   Procedure: LAPAROSCOPY DIAGNOSTIC EMERGENT TO OPEN;  Surgeon: Greer Pickerel, MD;  Location: [facility];  Service: General;  Laterality: N/A;  . LAPAROTOMY N/A 07/06/2016   Procedure: EXPLORATORY LAPAROTOMY LYSIS OF ADHESIONS FOR 3 HOURS;  Surgeon: Greer Pickerel, MD;  Location: [facility];  Service: General;  Laterality: N/A;  . LUMBAR FUSION  2014   L4 -- L5  . MASS EXCISION Left 02/20/2013   Procedure: EXCISION LEFT BUTTOCK  MASS;  Surgeon: Adin Hector, MD;  Location: [facility];  Service: General;  Laterality: Left;  . NOSE SURGERY  2007  . REMOVAL HARDWARE L4-L5/  BILATERAL LAMINECTOMY L2 - L5 AND FUSION  06-12-2011  . SHOULDER OPEN ROTATOR CUFF REPAIR Left 05/05/2014   Procedure: OPEN ACROMIONECTOMY AND OPEN REPAIR OF ROTATOR CUFF, TISSUEMEND GRAFT WITH ANCHOR ;  Surgeon: Tobi Bastos, MD;  Location: [facility];  Service: Orthopedics;  Laterality: Left;  . SPINE SURGERY    . TONSILLECTOMY      Allergies  Allergen Reactions  . Latex Other (See Comments)    Sores, blisters - reaction to gloves  . Penicillins Other (See Comments)    Pt was told she was allergic to penicillin in her mid 20'2 - does not recall the reaction. She has taken amoxicillin and ampicillin with no reaction.     Allergies as of 10/01/2016      Reactions   Latex Other (See Comments)   Sores, blisters - reaction to gloves   Penicillins Other (See Comments)   Pt was told she was allergic to penicillin in her mid 20'2 - does not recall the reaction. She has taken amoxicillin and ampicillin with no reaction.        Medication List       Accurate as of 10/01/16  3:01 PM. Always use your most recent med list.          acetaminophen 500 MG tablet Commonly known as:  TYLENOL Take 500 mg by mouth every 8 (eight) hours as needed for moderate pain.   calcium-vitamin D 500-200 MG-UNIT tablet Commonly known as:  OSCAL WITH D Take 1 tablet by mouth daily with breakfast.   cloNIDine 0.1 MG tablet Commonly known as:  CATAPRES Take 1 tablet (0.1 mg total) by mouth 3 (three) times daily.   DECUBI-VITE PO Take 1 tablet by mouth daily.   estradiol 0.1 MG/GM vaginal cream Commonly known as:  ESTRACE Place 1 Applicatorful vaginally 2 (two) times a week.   feeding supplement (PRO-STAT SUGAR FREE 64) Liqd Take 30 mLs by mouth daily.   fluticasone 50 MCG/ACT  nasal spray Commonly known as:  FLONASE Place 1 spray into both nostrils daily.   gabapentin 300 MG capsule Commonly known as:  NEURONTIN Take 300 mg by mouth 2 (two) times daily.   hydrALAZINE 25 MG tablet Commonly known as:  APRESOLINE Take 25 mg by mouth 3 (three) times daily.   HYDROcodone-acetaminophen 5-325 MG tablet Commonly known as:  NORCO/VICODIN Take one tablet by mouth every 4 hours as needed for moderate pain. Take two tablets by mouth every 4 hours as needed for severe pain. Do not exceed 3gm of APAP in 24 hours   ICY HOT EX Apply 1 application topically 2 (two) times daily as needed (pain). Gel   l-methylfolate-B6-B12 3-35-2 MG Tabs tablet Commonly known as:  METANX Take 1 tablet by mouth 2 (two) times daily.   meloxicam 7.5 MG tablet Commonly known as:  MOBIC Take 7.5 mg by mouth daily.   metoprolol 50 MG tablet Commonly known as:  LOPRESSOR Take 1 tablet (50 mg total) by mouth 2 (two) times daily.   ondansetron 4 MG tablet Commonly known as:  ZOFRAN Take 1 tablet (4 mg total) by mouth every 6 (six) hours as needed for nausea.   pantoprazole 40 MG tablet Commonly known as:  PROTONIX Take 40 mg by mouth daily.    PARoxetine 40 MG tablet Commonly known as:  PAXIL Take 1 tablet (40 mg total) by mouth daily.   polyethylene glycol packet Commonly known as:  MIRALAX / GLYCOLAX Take 17 g by mouth daily.   senna-docusate 8.6-50 MG tablet Commonly known as:  Senokot-S Take 2 tablets by mouth 2 (two) times daily.   UNABLE TO FIND Med Name: Med pass 90 mL 2 times daily   VITAMIN B COMPLEX PO Take 1 tablet by mouth daily.   vitamin E 400 UNIT capsule Take 400 Units by mouth daily.       Review of Systems  Constitutional: Negative for activity change, appetite change, chills, fatigue and fever.  HENT: Negative for congestion, rhinorrhea, sinus pressure, sneezing and sore throat.   Eyes: Negative.   Respiratory: Negative for cough, chest tightness, shortness of breath and wheezing.   Cardiovascular: Negative for chest pain, palpitations and leg swelling.  Gastrointestinal: Negative for abdominal distention, constipation, diarrhea, nausea and vomiting.  Endocrine: Negative for polydipsia, polyphagia and polyuria.  Genitourinary: Negative for difficulty urinating, dysuria, flank pain, frequency and urgency.  Musculoskeletal: Positive for back pain and gait problem.  Skin: Negative for color change, pallor and rash.       Mid -abd surgical wound managed by facility wound care Nurse. Wound progressive healing. No drainage or signs of infections.   Neurological: Negative for dizziness, syncope, light-headedness and headaches.  Hematological: Does not bruise/bleed easily.  Psychiatric/Behavioral: Negative for agitation, confusion, hallucinations and sleep disturbance. The patient is not nervous/anxious.     Immunization History  Administered Date(s) Administered  . Influenza-Unspecified 06/26/2016  . PPD Test 10/03/2015, 01/06/2016, 01/16/2016, 02/01/2016, 02/15/2016   Pertinent  Health Maintenance Due  Topic Date Due  . COLONOSCOPY  01/23/1996  . DEXA SCAN  01/23/2011  . PNA vac Low Risk  Adult (1 of 2 - PCV13) 01/23/2011  . MAMMOGRAM  11/28/2017  . INFLUENZA VACCINE  Completed   Fall Risk  06/26/2016  Falls in the past year? No    Vitals:   10/01/16 1130  BP: (!) 180/92  Pulse: 73  Resp: 20  Temp: 98.7 F (37.1 C)  SpO2: 98%  Weight: 260 lb  6.4 oz (118.1 kg)  Height: 5\' 5"  (1.651 m)   Body mass index is 43.33 kg/m. Physical Exam  Constitutional: She is oriented to person, place, and time. She appears well-developed and well-nourished. No distress.  HENT:  Head: Normocephalic.  Mouth/Throat: Oropharynx is clear and moist. No oropharyngeal exudate.  Eyes: Conjunctivae and EOM are normal. Pupils are equal, round, and reactive to light. Right eye exhibits no discharge. Left eye exhibits no discharge. No scleral icterus.  Neck: Normal range of motion. No JVD present. No thyromegaly present.  Cardiovascular: Normal rate, regular rhythm, normal heart sounds and intact distal pulses.  Exam reveals no gallop and no friction rub.   No murmur heard. Pulmonary/Chest: Effort normal and breath sounds normal. No respiratory distress. She has no wheezes. She has no rales.  Abdominal: Soft. Bowel sounds are normal. She exhibits no distension. There is no tenderness. There is no rebound and no guarding.  Genitourinary:  Genitourinary Comments: Continent   Musculoskeletal: She exhibits no edema, tenderness or deformity.  Limited ROM to lower back due to pain   Lymphadenopathy:    She has no cervical adenopathy.  Neurological: She is oriented to person, place, and time.  Skin: Skin is warm and dry. No rash noted. No erythema. No pallor.  Mid- Abdominal Surg incision progressive healing drsg dry,clean and intact without any signs of infections.   Psychiatric: She has a normal mood and affect.    Labs reviewed:  Recent Labs  07/25/16 0955 07/26/16 0540 07/27/16 0415 07/28/16 0500 08/01/16 0436 08/06/16 09/14/16  NA 140 139 139 138 138 142 137  K 3.4* 3.1* 3.3* 3.8 3.4*  4.0 4.1  CL 104 101 103 101 100*  --   --   CO2 32 32 31 29 30   --   --   GLUCOSE 85 107* 116* 96 109*  --   --   BUN <5* <5* <5* 9 7 10 13   CREATININE 0.60 0.63 0.57 0.69 0.72 0.7 0.7  CALCIUM 8.2* 8.3* 8.3* 8.5* 8.9  --   --   MG 1.8 1.9 2.2  --   --   --   --   PHOS 3.0 2.9 3.6  --   --   --   --     Recent Labs  07/21/16 0011 07/23/16 0602 07/26/16 0540 08/06/16  AST 26 11* 23 15  ALT 28 15 17 10   ALKPHOS 103 85 83 91  BILITOT 0.8 0.4 0.3  --   PROT 6.3* 5.9* 5.7*  --   ALBUMIN 2.5* 2.3* 2.2*  --     Recent Labs  07/21/16 0123  07/26/16 0540 07/28/16 0500 08/01/16 0436 08/06/16 09/14/16 09/18/16  WBC 8.0  < > 5.0 5.6 5.9 4.5 5.1 5.1  NEUTROABS 5.9  --   --   --   --   --  2,372 2  HGB 9.7*  < > 9.4* 9.8* 10.6* 11.1* 11.3* 11.6*  HCT 29.7*  < > 29.1* 30.2* 32.2* 34* 35* 37  MCV 90.8  < > 90.4 90.4 91.5  --   --   --   PLT 583*  < > 464* 416* 314 332 341 316  < > = values in this interval not displayed. Lab Results  Component Value Date   TSH 2.825 07/07/2016   No results found for: HGBA1C Lab Results  Component Value Date   CHOL 165 07/03/2016   HDL 82 07/03/2016   LDLCALC 75 07/03/2016   TRIG  148 07/27/2016   CHOLHDL 2.0 07/03/2016   Assessment/Plan 1. Essential hypertension B/p elevated. Facility Log ranging 130's/70's- 180's/90's. Adjust Hydralazine to 50 mg Tablet three times daily. Continue on metoprolol and clonidine. Monitor B/P and HR every shift X 1 weeks then resume previous orders.   2. Chronic bilateral low back pain with right-sided sciatica Worsening lower back pain with radiation to mid right thigh.seen by facility pain management specialist this visit Meloxicam 7.5 mg Tablet daily added and  Lidoderm patch were discontinued states ineffective.Continue on Norco 5/325 mg Tablet 1-2 tablets every 4 hours PRN. Refer to Ortho for evaluation of worsening lower back pain Post laminectomy. Continue restorative therapy.   3. Neuropathic pain of both  legs Continue on Gabapentin.     Family/ staff Communication: Reviewed plan of care with patient, Dr. Bubba Camp, facility Pain specialist and facility Nurse supervisor.   Labs/tests ordered: due for CBC, BMP in one week per PMR orders.

## 2016-10-03 DIAGNOSIS — M961 Postlaminectomy syndrome, not elsewhere classified: Secondary | ICD-10-CM | POA: Diagnosis not present

## 2016-10-05 DIAGNOSIS — M961 Postlaminectomy syndrome, not elsewhere classified: Secondary | ICD-10-CM | POA: Diagnosis not present

## 2016-10-15 ENCOUNTER — Non-Acute Institutional Stay (SKILLED_NURSING_FACILITY): Payer: Medicare Other | Admitting: Family

## 2016-10-15 DIAGNOSIS — J3089 Other allergic rhinitis: Secondary | ICD-10-CM | POA: Diagnosis not present

## 2016-10-15 DIAGNOSIS — K5901 Slow transit constipation: Secondary | ICD-10-CM

## 2016-10-15 DIAGNOSIS — G8929 Other chronic pain: Secondary | ICD-10-CM

## 2016-10-15 DIAGNOSIS — K219 Gastro-esophageal reflux disease without esophagitis: Secondary | ICD-10-CM

## 2016-10-15 DIAGNOSIS — G4733 Obstructive sleep apnea (adult) (pediatric): Secondary | ICD-10-CM

## 2016-10-15 DIAGNOSIS — I10 Essential (primary) hypertension: Secondary | ICD-10-CM

## 2016-10-15 DIAGNOSIS — K56609 Unspecified intestinal obstruction, unspecified as to partial versus complete obstruction: Secondary | ICD-10-CM | POA: Diagnosis not present

## 2016-10-15 DIAGNOSIS — M5441 Lumbago with sciatica, right side: Secondary | ICD-10-CM

## 2016-10-17 NOTE — Progress Notes (Addendum)
Location:  [facility] Room Number: [place] of Service:  SNF (31)  Provider: Marlowe Sax FNP-C   PCP: Blanchie Serve, MD Patient Care Team: Blanchie Serve, MD as PCP - General (Internal Medicine)  Extended Emergency Contact Information Primary Emergency Contact: Amy [name] Address: [address]          [place], [place] [address] Amy [name] of [place] Phone: [phone] Mobile Phone: [phone] Relation: Spouse Secondary Emergency Contact: [name] Address: [address]          [place], [place] [address] Amy [name] of [place] Phone: [phone] Mobile Phone: [phone] Relation: Daughter  Code Status: DNR  Goals of care:  Advanced Directive information Advanced Directives 07/21/2016  Does Patient Have a Medical Advance Directive? Yes  Type of Advance Directive Living will;Out of facility DNR (pink MOST or yellow form)  Does patient want to make changes to medical advance directive? -  Copy of [place] in Chart? No - copy requested  Would patient like information on creating a medical advance directive? -  Pre-existing out of facility DNR order (yellow form or pink MOST form) -     Allergies  Allergen Reactions  . Latex Other (See Comments)    Sores, blisters - reaction to gloves  . Penicillins Other (See Comments)    Pt was told she was allergic to penicillin in her mid 20'2 - does not recall the reaction. She has taken amoxicillin and ampicillin with no reaction.     Chief Complaint  Patient presents with  . Discharge Note    discharge home     HPI:  71 y.o. female seen today at [facility] for discharge home. She was here for short term rehabilitation post hospital admission from 07/02/16-07/16/16 with small bowel obstruction status post exploratory laprotomy and lysis of adhesions. She was re-admitted from 07/20/16-08/01/16 with wound infection of surgical  incision. She was placed on I.V antibiotics and had wound care. No wound vac was recommended. She was switched to oral antibiotic and discharged back to rehab.She has a medical history of HTN, OSA, GERD,OA, chronic back pain, Neuropathy,Fibromyalgia,dysphonia,depression among other conditions. She is seen in her room today. During her stay here in rehab she complained of worsening lower back pain with radiation to right mid thigh.She was seen by facility pain management specialist Lidoderm patches ordered but later discontinued states ineffective.Meloxicam 7.5 mg Tablet daily added.She is on Norco 5/325 mg Tablet 1-2 tablets every 4 hours PRN.She has an upcoming appointment with Ortho specialist for lower back pain with radiation to right thigh.Of note she had Laminectomy in 2017.Her abdominal wound was managed by facility wound care Nurse with much improvement.She has worked with PT/OT now stable for discharge home. She will be discharged with Home health PT/OT to continue with ROM, Exercise, Gait stability and muscle strengthening.She will also require a [facility] RN for mid lower abdomen surgical incision wound management. She also request for a HH Aide to assist with ADL's.She does not require any DME has own FWW at home. Home health services will be arranged by facility social worker prior to discharge. Prescription medication will be written x 1 month then patient to follow up with PCP in 1-2 weeks.   Past Medical History:  Diagnosis Date  . Abdominal pain of unknown etiology 02/05/2016  . Anxiety   . Arthritis    ddd- RA  . Asthma    "sleeping asthma"  .  Chronic back pain    lumbar steroid injection's  . Constipation   . Depression   . Dysphonia    intermittent "voice changes"  . Fibromyalgia   . GERD (gastroesophageal reflux disease)   . Glaucoma   . Headache   . History of DVT (deep vein thrombosis) 1989   LEFT UPPER ARM  . History of hiatal hernia   . History of MRSA infection 2011  .  Hypertension   . Irregular heart rate    "years ago"  . Low iron   . Mild obstructive sleep apnea    per study 02-07-2006 - no cpap  . Neuropathy (HCC)    feet  . Pelvic pain in female   . Peripheral vascular disease ([facility])    "poor circulation"  . SBO (small bowel obstruction) 06/2016  . Seasonal allergies   . Short of breath on exertion   . Urinary frequency   . Weakness    both hands and feet    Past Surgical History:  Procedure Laterality Date  . abdominal adhesions removed    . ABDOMINAL HYSTERECTOMY  1978  . BUNIONECTOMY Left 2008  . COLONOSCOPY    . CYSTO WITH HYDRODISTENSION N/A 07/12/2015   Procedure: CYSTOSCOPY/HYDRODISTENSION;  Surgeon: Bjorn Loser, MD;  Location: [facility];  Service: Urology;  Laterality: N/A;  . ESOPHAGEAL MANOMETRY N/A 11/22/2014   Procedure: ESOPHAGEAL MANOMETRY (EM);  Surgeon: Winfield Cunas., MD;  Location: [facility];  Service: Endoscopy;  Laterality: N/A;  . ESOPHAGOGASTRODUODENOSCOPY N/A 07/15/2013   Procedure: ESOPHAGOGASTRODUODENOSCOPY (EGD);  Surgeon: Winfield Cunas., MD;  Location: [facility];  Service: Endoscopy;  Laterality: N/A;  need xray  . EYE SURGERY Bilateral    cataract surgery with lens implants  . HARDWARE REMOVAL N/A 01/02/2016   Procedure: Exploration of Lumbar Fusion,Removal of hardware Lumbar One-Two ;Redo Posterior Lumbar Fusion Lumbar One-Two;  Surgeon: Kary Kos, MD;  Location: [facility];  Service: Neurosurgery;  Laterality: N/A;  . LAPAROSCOPIC CHOLECYSTECTOMY  01-11-2006  . LAPAROSCOPY N/A 07/06/2016   Procedure: LAPAROSCOPY DIAGNOSTIC EMERGENT TO OPEN;  Surgeon: Greer Pickerel, MD;  Location: [facility];  Service: General;  Laterality: N/A;  . LAPAROTOMY N/A 07/06/2016   Procedure: EXPLORATORY LAPAROTOMY LYSIS OF ADHESIONS FOR 3 HOURS;  Surgeon: Greer Pickerel, MD;  Location: [facility];  Service: General;  Laterality: N/A;  . LUMBAR FUSION  2014   L4 -- L5  . MASS EXCISION Left 02/20/2013    Procedure: EXCISION LEFT BUTTOCK  MASS;  Surgeon: Adin Hector, MD;  Location: [facility];  Service: General;  Laterality: Left;  . NOSE SURGERY  2007  . REMOVAL HARDWARE L4-L5/  BILATERAL LAMINECTOMY L2 - L5 AND FUSION  06-12-2011  . SHOULDER OPEN ROTATOR CUFF REPAIR Left 05/05/2014   Procedure: OPEN ACROMIONECTOMY AND OPEN REPAIR OF ROTATOR CUFF, TISSUEMEND GRAFT WITH ANCHOR ;  Surgeon: Tobi Bastos, MD;  Location: [facility];  Service: Orthopedics;  Laterality: Left;  . SPINE SURGERY    . TONSILLECTOMY        reports that she quit smoking about 47 years ago. She has never used smokeless tobacco. She reports that she drinks alcohol. She reports that she does not use drugs. Social History   Social History  . Marital status: Married    Spouse name: N/A  . Number of children: N/A  . Years of education: N/A   Occupational History  . Not on file.   Social History Main Topics  .  Smoking status: Former Smoker    Quit date: 07/10/1969  . Smokeless tobacco: Never Used  . Alcohol use Yes     Comment: rare  . Drug use: No  . Sexual activity: Not on file   Other Topics Concern  . Not on file   Social History Narrative  . No narrative on file    Allergies  Allergen Reactions  . Latex Other (See Comments)    Sores, blisters - reaction to gloves  . Penicillins Other (See Comments)    Pt was told she was allergic to penicillin in her mid 20'2 - does not recall the reaction. She has taken amoxicillin and ampicillin with no reaction.     Pertinent  Health Maintenance Due  Topic Date Due  . COLONOSCOPY  01/23/1996  . DEXA SCAN  01/23/2011  . PNA vac Low Risk Adult (1 of 2 - PCV13) 01/23/2011  . MAMMOGRAM  11/28/2017  . INFLUENZA VACCINE  Completed    Medications: Allergies as of 10/15/2016      Reactions   Latex Other (See Comments)   Sores, blisters - reaction to gloves   Penicillins Other (See Comments)   Pt was told she was allergic to penicillin in her mid 20'2 - does not  recall the reaction. She has taken amoxicillin and ampicillin with no reaction.      Medication List       Accurate as of 10/15/16 11:59 PM. Always use your most recent med list.          acetaminophen 500 MG tablet Commonly known as:  TYLENOL Take 500 mg by mouth every 8 (eight) hours as needed for moderate pain.   calcium-vitamin D 500-200 MG-UNIT tablet Commonly known as:  OSCAL WITH D Take 1 tablet by mouth daily with breakfast.   cloNIDine 0.1 MG tablet Commonly known as:  CATAPRES Take 1 tablet (0.1 mg total) by mouth 3 (three) times daily.   DECUBI-VITE PO Take 1 tablet by mouth daily.   estradiol 0.1 MG/GM vaginal cream Commonly known as:  ESTRACE Place 1 Applicatorful vaginally 2 (two) times a week.   feeding supplement (PRO-STAT SUGAR FREE 64) Liqd Take 30 mLs by mouth daily.   fluticasone 50 MCG/ACT nasal spray Commonly known as:  FLONASE Place 1 spray into both nostrils daily.   gabapentin 300 MG capsule Commonly known as:  NEURONTIN Take 300 mg by mouth 2 (two) times daily.   hydrALAZINE 50 MG tablet Commonly known as:  APRESOLINE Take 50 mg by mouth 3 (three) times daily.   HYDROcodone-acetaminophen 5-325 MG tablet Commonly known as:  NORCO/VICODIN Take one tablet by mouth every 4 hours as needed for moderate pain. Take two tablets by mouth every 4 hours as needed for severe pain. Do not exceed 3gm of APAP in 24 hours   ICY HOT EX Apply 1 application topically 2 (two) times daily as needed (pain). Gel   l-methylfolate-B6-B12 3-35-2 MG Tabs tablet Commonly known as:  METANX Take 1 tablet by mouth 2 (two) times daily.   meloxicam 15 MG tablet Commonly known as:  MOBIC Take 15 mg by mouth daily.   methocarbamol 500 MG tablet Commonly known as:  ROBAXIN Take 500 mg by mouth 2 (two) times daily as needed for muscle spasms. Hold for sedation   metoprolol 50 MG tablet Commonly known as:  LOPRESSOR Take 1 tablet (50 mg total) by mouth 2 (two)  times daily.   ondansetron 4 MG tablet Commonly known as:  ZOFRAN Take 1 tablet (4 mg total) by mouth every 6 (six) hours as needed for nausea.   pantoprazole 40 MG tablet Commonly known as:  PROTONIX Take 40 mg by mouth daily.   PARoxetine 40 MG tablet Commonly known as:  PAXIL Take 1 tablet (40 mg total) by mouth daily.   polyethylene glycol packet Commonly known as:  MIRALAX / GLYCOLAX Take 17 g by mouth daily.   senna-docusate 8.6-50 MG tablet Commonly known as:  Senokot-S Take 2 tablets by mouth 2 (two) times daily.   UNABLE TO FIND Med Name: Med pass 90 mL 2 times daily   VITAMIN B COMPLEX PO Take 1 tablet by mouth daily.   vitamin E 400 UNIT capsule Take 400 Units by mouth daily.       Review of Systems  Constitutional: Negative for activity change, appetite change, chills, fatigue and fever.  HENT: Negative for congestion, rhinorrhea, sinus pressure, sneezing and sore throat.   Eyes: Negative.   Respiratory: Negative for cough, chest tightness, shortness of breath and wheezing.   Cardiovascular: Negative for chest pain, palpitations and leg swelling.  Gastrointestinal: Negative for abdominal distention, constipation, diarrhea, nausea and vomiting.  Endocrine: Negative for cold intolerance, heat intolerance, polydipsia, polyphagia and polyuria.  Genitourinary: Negative for difficulty urinating, dysuria, flank pain, frequency and urgency.  Musculoskeletal: Positive for back pain and gait problem.  Skin: Negative for color change, pallor and rash.       Mid -abd surgical wound managed by facility wound care Nurse.   Neurological: Negative for dizziness, syncope, light-headedness and headaches.  Hematological: Does not bruise/bleed easily.  Psychiatric/Behavioral: Negative for agitation, confusion, hallucinations and sleep disturbance. The patient is not nervous/anxious.     Vitals:   10/15/16 1200  BP: 138/70  Pulse: 82  Resp: 18  Temp: 97.6 F (36.4 C)    SpO2: 97%  Weight: 260 lb 6.4 oz (118.1 kg)  Height: 5\' 5"  (1.651 m)   Body mass index is 43.33 kg/m. Physical Exam  Constitutional: She is oriented to person, place, and time. She appears well-developed and well-nourished. No distress.  HENT:  Head: Normocephalic.  Mouth/Throat: Oropharynx is clear and moist. No oropharyngeal exudate.  Eyes: Conjunctivae and EOM are normal. Pupils are equal, round, and reactive to light. Right eye exhibits no discharge. Left eye exhibits no discharge. No scleral icterus.  Neck: Normal range of motion. No JVD present. No thyromegaly present.  Cardiovascular: Normal rate, regular rhythm, normal heart sounds and intact distal pulses.  Exam reveals no gallop and no friction rub.   No murmur heard. Pulmonary/Chest: Effort normal and breath sounds normal. No respiratory distress. She has no wheezes. She has no rales.  Abdominal: Soft. Bowel sounds are normal. She exhibits no distension. There is no tenderness. There is no rebound and no guarding.  Genitourinary:  Genitourinary Comments: Continent   Musculoskeletal: She exhibits no edema, tenderness or deformity.  Unsteady gait.Limited ROM to lower back due to pain   Lymphadenopathy:    She has no cervical adenopathy.  Neurological: She is oriented to person, place, and time.  Skin: Skin is warm and dry. No rash noted. No erythema. No pallor.  Mid- Abdominal Surg incision progressive healing.Wound progressive healing. Small serosanguinous drainage noted on dressing. No signs of infections noted.    Psychiatric: She has a normal mood and affect.    Labs reviewed: Basic Metabolic Panel:  Recent Labs  07/25/16 0955 07/26/16 0540 07/27/16 0415 07/28/16 0500 08/01/16 0436 08/06/16 09/14/16  NA 140 139 139 138 138 142 137  K 3.4* 3.1* 3.3* 3.8 3.4* 4.0 4.1  CL 104 101 103 101 100*  --   --   CO2 32 32 31 29 30   --   --   GLUCOSE 85 107* 116* 96 109*  --   --   BUN <5* <5* <5* 9 7 10 13   CREATININE  0.60 0.63 0.57 0.69 0.72 0.7 0.7  CALCIUM 8.2* 8.3* 8.3* 8.5* 8.9  --   --   MG 1.8 1.9 2.2  --   --   --   --   PHOS 3.0 2.9 3.6  --   --   --   --    Liver Function Tests:  Recent Labs  07/21/16 0011 07/23/16 0602 07/26/16 0540 08/06/16  AST 26 11* 23 15  ALT 28 15 17 10   ALKPHOS 103 85 83 91  BILITOT 0.8 0.4 0.3  --   PROT 6.3* 5.9* 5.7*  --   ALBUMIN 2.5* 2.3* 2.2*  --     Recent Labs  07/02/16 1650 07/03/16 0627 07/04/16 0318  LIPASE 3,627* 627* 61*   CBC:  Recent Labs  07/21/16 0123  07/26/16 0540 07/28/16 0500 08/01/16 0436 08/06/16 09/14/16 09/18/16  WBC 8.0  < > 5.0 5.6 5.9 4.5 5.1 5.1  NEUTROABS 5.9  --   --   --   --   --  2,372 2  HGB 9.7*  < > 9.4* 9.8* 10.6* 11.1* 11.3* 11.6*  HCT 29.7*  < > 29.1* 30.2* 32.2* 34* 35* 37  MCV 90.8  < > 90.4 90.4 91.5  --   --   --   PLT 583*  < > 464* 416* 314 332 341 316  < > = values in this interval not displayed. Cardiac Enzymes:  Recent Labs  07/10/16 1907 07/10/16 2231 07/11/16 0433  TROPONINI <0.03 0.09* 0.04*   Recent Labs  08/01/16 0801 08/01/16 1153 08/01/16 1620  GLUCAP 100* 100* 113*   Assessment/Plan:   1. Essential hypertension B/p stable.Continue on metoprolol, Hydralazine and clonidine   2. Obstructive sleep apnea Continue CPAP.   3. Chronic bilateral low back pain with right-sided sciatica Continue on Meloxicam 7.5 mg Tablet daily,Gabapentin and Norco 5/325 mg Tablet 1-2 tablets every 4 hours PRN.follow up with upcoming appointment with Ortho specialist .  4. SBO (small bowel obstruction) Status post short term rehabilitation post hospital admission from 07/02/16-07/16/16 with small bowel obstruction status post exploratory laprotomy and lysis of adhesions. She was re-admitted from 07/20/16-08/01/16 with wound infection of surgical incision. She was placed on I.V antibiotics.Mid lower abdominal wound progressive healing. Will discharge with [facility] RN for wound management.HH Aide to assist  with ADL's.Continue to follow up with Surgical specialist as directed. Continue current pain regimen.   5. Gastroesophageal reflux disease without esophagitis Continue on Protonix 40 mg Tablet.  6. Slow transit constipation Continue Miralax and Senokot-S. Continue to encourage oral and fluid intake.   7. Allergic rhinitis  Continue Flonase daily.   Patient is being discharged with the following home health services:    -PT/OT to continue with ROM, Exercise, Gait stability and muscle strengthening.  - HH RN for mid lower abdomen surgical incision wound management.  - HH Aide to assist with ADL's.  Patient is being discharged with the following durable medical equipment:    - No DME required has own FWW at home.   Patient has been advised to f/u with their PCP  in 1-2 weeks to for a transitions of care visit.Social services at their facility was responsible for arranging this appointment.Pt was provided with adequate prescriptions of noncontrolled medications to reach the scheduled appointment .For controlled substances, a limited supply was provided as appropriate for the individual patient. If the pt normally receives these medications from a pain clinic or has a contract with another physician, these medications should be received from that clinic or physician only).    Future labs/tests needed: CBC, BMP in 1-2 weeks with PCP

## 2016-10-18 DIAGNOSIS — F329 Major depressive disorder, single episode, unspecified: Secondary | ICD-10-CM | POA: Diagnosis not present

## 2016-10-18 DIAGNOSIS — Z86718 Personal history of other venous thrombosis and embolism: Secondary | ICD-10-CM | POA: Diagnosis not present

## 2016-10-18 DIAGNOSIS — Z87891 Personal history of nicotine dependence: Secondary | ICD-10-CM | POA: Diagnosis not present

## 2016-10-18 DIAGNOSIS — F419 Anxiety disorder, unspecified: Secondary | ICD-10-CM | POA: Diagnosis not present

## 2016-10-18 DIAGNOSIS — M19011 Primary osteoarthritis, right shoulder: Secondary | ICD-10-CM | POA: Diagnosis not present

## 2016-10-18 DIAGNOSIS — T8131XD Disruption of external operation (surgical) wound, not elsewhere classified, subsequent encounter: Secondary | ICD-10-CM | POA: Diagnosis not present

## 2016-10-18 DIAGNOSIS — G629 Polyneuropathy, unspecified: Secondary | ICD-10-CM | POA: Diagnosis not present

## 2016-10-18 DIAGNOSIS — M160 Bilateral primary osteoarthritis of hip: Secondary | ICD-10-CM | POA: Diagnosis not present

## 2016-10-18 DIAGNOSIS — Z6841 Body Mass Index (BMI) 40.0 and over, adult: Secondary | ICD-10-CM | POA: Diagnosis not present

## 2016-10-18 DIAGNOSIS — G8929 Other chronic pain: Secondary | ICD-10-CM | POA: Diagnosis not present

## 2016-10-18 DIAGNOSIS — M6281 Muscle weakness (generalized): Secondary | ICD-10-CM | POA: Diagnosis not present

## 2016-10-18 DIAGNOSIS — I739 Peripheral vascular disease, unspecified: Secondary | ICD-10-CM | POA: Diagnosis not present

## 2016-10-18 DIAGNOSIS — M170 Bilateral primary osteoarthritis of knee: Secondary | ICD-10-CM | POA: Diagnosis not present

## 2016-10-18 DIAGNOSIS — R2689 Other abnormalities of gait and mobility: Secondary | ICD-10-CM | POA: Diagnosis not present

## 2016-10-18 DIAGNOSIS — M19012 Primary osteoarthritis, left shoulder: Secondary | ICD-10-CM | POA: Diagnosis not present

## 2016-10-18 DIAGNOSIS — Z8614 Personal history of Methicillin resistant Staphylococcus aureus infection: Secondary | ICD-10-CM | POA: Diagnosis not present

## 2016-10-18 DIAGNOSIS — T814XXD Infection following a procedure, subsequent encounter: Secondary | ICD-10-CM | POA: Diagnosis not present

## 2016-10-19 DIAGNOSIS — T814XXD Infection following a procedure, subsequent encounter: Secondary | ICD-10-CM | POA: Diagnosis not present

## 2016-10-19 DIAGNOSIS — M170 Bilateral primary osteoarthritis of knee: Secondary | ICD-10-CM | POA: Diagnosis not present

## 2016-10-19 DIAGNOSIS — T8131XD Disruption of external operation (surgical) wound, not elsewhere classified, subsequent encounter: Secondary | ICD-10-CM | POA: Diagnosis not present

## 2016-10-19 DIAGNOSIS — M6281 Muscle weakness (generalized): Secondary | ICD-10-CM | POA: Diagnosis not present

## 2016-10-19 DIAGNOSIS — R2689 Other abnormalities of gait and mobility: Secondary | ICD-10-CM | POA: Diagnosis not present

## 2016-10-19 DIAGNOSIS — M160 Bilateral primary osteoarthritis of hip: Secondary | ICD-10-CM | POA: Diagnosis not present

## 2016-10-22 DIAGNOSIS — Z1159 Encounter for screening for other viral diseases: Secondary | ICD-10-CM | POA: Diagnosis not present

## 2016-10-22 DIAGNOSIS — I10 Essential (primary) hypertension: Secondary | ICD-10-CM | POA: Diagnosis not present

## 2016-10-22 DIAGNOSIS — R7303 Prediabetes: Secondary | ICD-10-CM | POA: Diagnosis not present

## 2016-10-22 DIAGNOSIS — M48061 Spinal stenosis, lumbar region without neurogenic claudication: Secondary | ICD-10-CM | POA: Diagnosis not present

## 2016-10-22 DIAGNOSIS — M6281 Muscle weakness (generalized): Secondary | ICD-10-CM | POA: Diagnosis not present

## 2016-10-22 DIAGNOSIS — M170 Bilateral primary osteoarthritis of knee: Secondary | ICD-10-CM | POA: Diagnosis not present

## 2016-10-22 DIAGNOSIS — K224 Dyskinesia of esophagus: Secondary | ICD-10-CM | POA: Diagnosis not present

## 2016-10-22 DIAGNOSIS — M160 Bilateral primary osteoarthritis of hip: Secondary | ICD-10-CM | POA: Diagnosis not present

## 2016-10-22 DIAGNOSIS — T814XXS Infection following a procedure, sequela: Secondary | ICD-10-CM | POA: Diagnosis not present

## 2016-10-22 DIAGNOSIS — R0600 Dyspnea, unspecified: Secondary | ICD-10-CM | POA: Diagnosis not present

## 2016-10-22 DIAGNOSIS — R29898 Other symptoms and signs involving the musculoskeletal system: Secondary | ICD-10-CM | POA: Diagnosis not present

## 2016-10-22 DIAGNOSIS — T8131XD Disruption of external operation (surgical) wound, not elsewhere classified, subsequent encounter: Secondary | ICD-10-CM | POA: Diagnosis not present

## 2016-10-22 DIAGNOSIS — R2689 Other abnormalities of gait and mobility: Secondary | ICD-10-CM | POA: Diagnosis not present

## 2016-10-22 DIAGNOSIS — T814XXD Infection following a procedure, subsequent encounter: Secondary | ICD-10-CM | POA: Diagnosis not present

## 2016-10-23 DIAGNOSIS — M5137 Other intervertebral disc degeneration, lumbosacral region: Secondary | ICD-10-CM | POA: Diagnosis not present

## 2016-10-23 DIAGNOSIS — M170 Bilateral primary osteoarthritis of knee: Secondary | ICD-10-CM | POA: Diagnosis not present

## 2016-10-23 DIAGNOSIS — T8131XD Disruption of external operation (surgical) wound, not elsewhere classified, subsequent encounter: Secondary | ICD-10-CM | POA: Diagnosis not present

## 2016-10-23 DIAGNOSIS — R2689 Other abnormalities of gait and mobility: Secondary | ICD-10-CM | POA: Diagnosis not present

## 2016-10-23 DIAGNOSIS — T814XXD Infection following a procedure, subsequent encounter: Secondary | ICD-10-CM | POA: Diagnosis not present

## 2016-10-23 DIAGNOSIS — M160 Bilateral primary osteoarthritis of hip: Secondary | ICD-10-CM | POA: Diagnosis not present

## 2016-10-23 DIAGNOSIS — M6281 Muscle weakness (generalized): Secondary | ICD-10-CM | POA: Diagnosis not present

## 2016-10-24 DIAGNOSIS — T814XXD Infection following a procedure, subsequent encounter: Secondary | ICD-10-CM | POA: Diagnosis not present

## 2016-10-24 DIAGNOSIS — M170 Bilateral primary osteoarthritis of knee: Secondary | ICD-10-CM | POA: Diagnosis not present

## 2016-10-24 DIAGNOSIS — T8131XD Disruption of external operation (surgical) wound, not elsewhere classified, subsequent encounter: Secondary | ICD-10-CM | POA: Diagnosis not present

## 2016-10-24 DIAGNOSIS — M160 Bilateral primary osteoarthritis of hip: Secondary | ICD-10-CM | POA: Diagnosis not present

## 2016-10-24 DIAGNOSIS — M6281 Muscle weakness (generalized): Secondary | ICD-10-CM | POA: Diagnosis not present

## 2016-10-24 DIAGNOSIS — R2689 Other abnormalities of gait and mobility: Secondary | ICD-10-CM | POA: Diagnosis not present

## 2016-10-25 DIAGNOSIS — M160 Bilateral primary osteoarthritis of hip: Secondary | ICD-10-CM | POA: Diagnosis not present

## 2016-10-25 DIAGNOSIS — R2689 Other abnormalities of gait and mobility: Secondary | ICD-10-CM | POA: Diagnosis not present

## 2016-10-25 DIAGNOSIS — M170 Bilateral primary osteoarthritis of knee: Secondary | ICD-10-CM | POA: Diagnosis not present

## 2016-10-25 DIAGNOSIS — T814XXD Infection following a procedure, subsequent encounter: Secondary | ICD-10-CM | POA: Diagnosis not present

## 2016-10-25 DIAGNOSIS — M6281 Muscle weakness (generalized): Secondary | ICD-10-CM | POA: Diagnosis not present

## 2016-10-25 DIAGNOSIS — T8131XD Disruption of external operation (surgical) wound, not elsewhere classified, subsequent encounter: Secondary | ICD-10-CM | POA: Diagnosis not present

## 2016-10-26 DIAGNOSIS — M6281 Muscle weakness (generalized): Secondary | ICD-10-CM | POA: Diagnosis not present

## 2016-10-26 DIAGNOSIS — M170 Bilateral primary osteoarthritis of knee: Secondary | ICD-10-CM | POA: Diagnosis not present

## 2016-10-26 DIAGNOSIS — T8131XD Disruption of external operation (surgical) wound, not elsewhere classified, subsequent encounter: Secondary | ICD-10-CM | POA: Diagnosis not present

## 2016-10-26 DIAGNOSIS — T814XXD Infection following a procedure, subsequent encounter: Secondary | ICD-10-CM | POA: Diagnosis not present

## 2016-10-26 DIAGNOSIS — M160 Bilateral primary osteoarthritis of hip: Secondary | ICD-10-CM | POA: Diagnosis not present

## 2016-10-26 DIAGNOSIS — R2689 Other abnormalities of gait and mobility: Secondary | ICD-10-CM | POA: Diagnosis not present

## 2016-10-29 ENCOUNTER — Other Ambulatory Visit: Payer: Self-pay | Admitting: Neurosurgery

## 2016-10-29 DIAGNOSIS — M170 Bilateral primary osteoarthritis of knee: Secondary | ICD-10-CM | POA: Diagnosis not present

## 2016-10-29 DIAGNOSIS — T8131XD Disruption of external operation (surgical) wound, not elsewhere classified, subsequent encounter: Secondary | ICD-10-CM | POA: Diagnosis not present

## 2016-10-29 DIAGNOSIS — T814XXD Infection following a procedure, subsequent encounter: Secondary | ICD-10-CM | POA: Diagnosis not present

## 2016-10-29 DIAGNOSIS — M5137 Other intervertebral disc degeneration, lumbosacral region: Secondary | ICD-10-CM

## 2016-10-29 DIAGNOSIS — R2689 Other abnormalities of gait and mobility: Secondary | ICD-10-CM | POA: Diagnosis not present

## 2016-10-29 DIAGNOSIS — M160 Bilateral primary osteoarthritis of hip: Secondary | ICD-10-CM | POA: Diagnosis not present

## 2016-10-29 DIAGNOSIS — M6281 Muscle weakness (generalized): Secondary | ICD-10-CM | POA: Diagnosis not present

## 2016-10-30 ENCOUNTER — Ambulatory Visit
Admission: RE | Admit: 2016-10-30 | Discharge: 2016-10-30 | Disposition: A | Discharge status: Home / Self Care | Payer: Medicare Other | Source: Ambulatory Visit | Attending: Neurosurgery | Admitting: Neurosurgery

## 2016-10-30 DIAGNOSIS — R2689 Other abnormalities of gait and mobility: Secondary | ICD-10-CM | POA: Diagnosis not present

## 2016-10-30 DIAGNOSIS — M4326 Fusion of spine, lumbar region: Secondary | ICD-10-CM | POA: Diagnosis not present

## 2016-10-30 DIAGNOSIS — M5137 Other intervertebral disc degeneration, lumbosacral region: Secondary | ICD-10-CM

## 2016-10-30 DIAGNOSIS — M170 Bilateral primary osteoarthritis of knee: Secondary | ICD-10-CM | POA: Diagnosis not present

## 2016-10-30 DIAGNOSIS — M961 Postlaminectomy syndrome, not elsewhere classified: Secondary | ICD-10-CM | POA: Diagnosis not present

## 2016-10-30 DIAGNOSIS — T8131XD Disruption of external operation (surgical) wound, not elsewhere classified, subsequent encounter: Secondary | ICD-10-CM | POA: Diagnosis not present

## 2016-10-30 DIAGNOSIS — M6281 Muscle weakness (generalized): Secondary | ICD-10-CM | POA: Diagnosis not present

## 2016-10-30 DIAGNOSIS — M160 Bilateral primary osteoarthritis of hip: Secondary | ICD-10-CM | POA: Diagnosis not present

## 2016-10-30 DIAGNOSIS — T814XXD Infection following a procedure, subsequent encounter: Secondary | ICD-10-CM | POA: Diagnosis not present

## 2016-10-31 DIAGNOSIS — T814XXD Infection following a procedure, subsequent encounter: Secondary | ICD-10-CM | POA: Diagnosis not present

## 2016-10-31 DIAGNOSIS — M160 Bilateral primary osteoarthritis of hip: Secondary | ICD-10-CM | POA: Diagnosis not present

## 2016-10-31 DIAGNOSIS — M170 Bilateral primary osteoarthritis of knee: Secondary | ICD-10-CM | POA: Diagnosis not present

## 2016-10-31 DIAGNOSIS — M6281 Muscle weakness (generalized): Secondary | ICD-10-CM | POA: Diagnosis not present

## 2016-10-31 DIAGNOSIS — R2689 Other abnormalities of gait and mobility: Secondary | ICD-10-CM | POA: Diagnosis not present

## 2016-10-31 DIAGNOSIS — T8131XD Disruption of external operation (surgical) wound, not elsewhere classified, subsequent encounter: Secondary | ICD-10-CM | POA: Diagnosis not present

## 2016-11-02 DIAGNOSIS — M170 Bilateral primary osteoarthritis of knee: Secondary | ICD-10-CM | POA: Diagnosis not present

## 2016-11-02 DIAGNOSIS — T814XXD Infection following a procedure, subsequent encounter: Secondary | ICD-10-CM | POA: Diagnosis not present

## 2016-11-02 DIAGNOSIS — T8131XD Disruption of external operation (surgical) wound, not elsewhere classified, subsequent encounter: Secondary | ICD-10-CM | POA: Diagnosis not present

## 2016-11-02 DIAGNOSIS — M160 Bilateral primary osteoarthritis of hip: Secondary | ICD-10-CM | POA: Diagnosis not present

## 2016-11-02 DIAGNOSIS — M6281 Muscle weakness (generalized): Secondary | ICD-10-CM | POA: Diagnosis not present

## 2016-11-02 DIAGNOSIS — R2689 Other abnormalities of gait and mobility: Secondary | ICD-10-CM | POA: Diagnosis not present

## 2016-11-02 IMAGING — CT CT L SPINE W/O CM
3 of 12 series · 8 of 33 positions shown, 10 images · non-contrast
Comparison: 03/26/2016.

CLINICAL DATA: Low back pain.  Laterality not specified.

EXAM:
CT LUMBAR SPINE WITHOUT CONTRAST
TECHNIQUE: Multidetector CT imaging of the lumbar spine was performed without
intravenous contrast administration. Multiplanar CT image
reconstructions were also generated.

[Series 5: l-spine detail · axial · 0.32mm/px · z∈[+4,+81]mm · 2 of 94 slices shown, 3 images]
[im 32/94  soft-tissue]
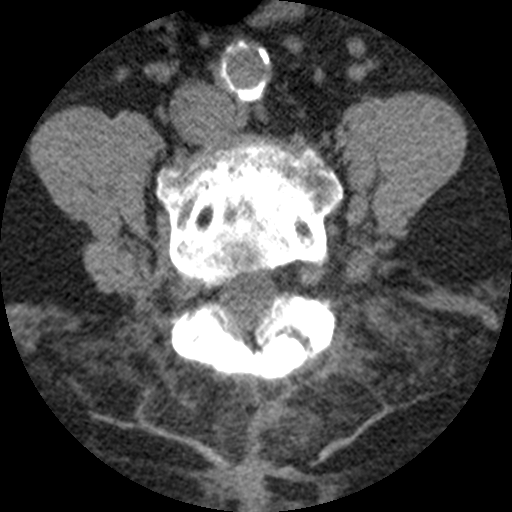
[im 32/94  bone]
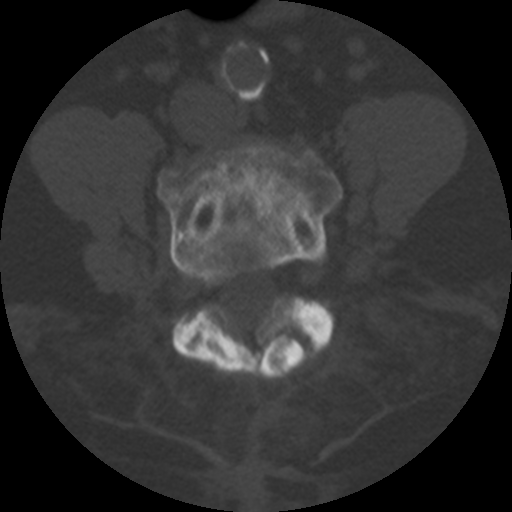
[im 63/94  bone]
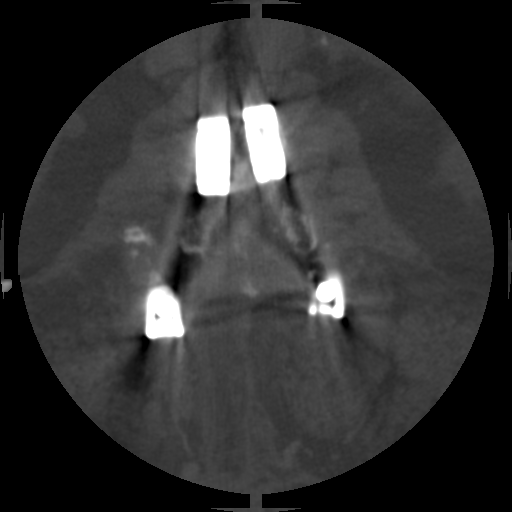

[Series 201: cor 2 · coronal · 0.47mm/px · 1 of 73 slices shown]
[im 37/73  bone]
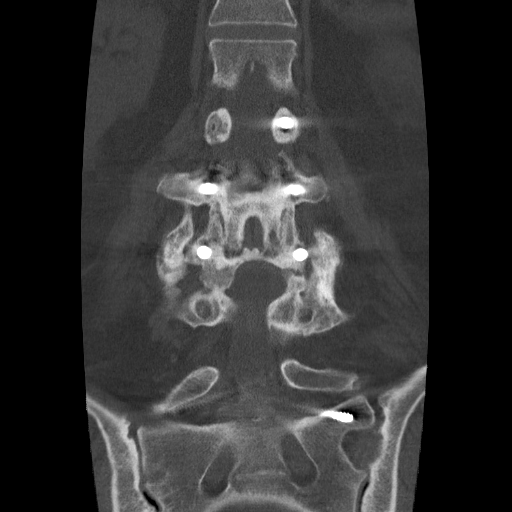

[Series 202: sag · sagittal · 0.47mm/px · 5 of 73 slices shown, 6 images]
[im 25/73  bone]
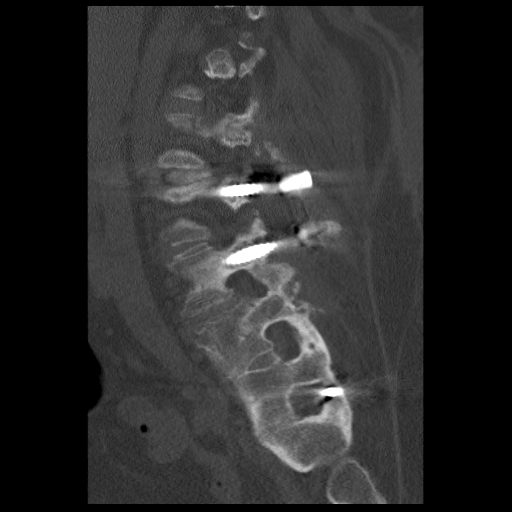
[im 31/73  bone]
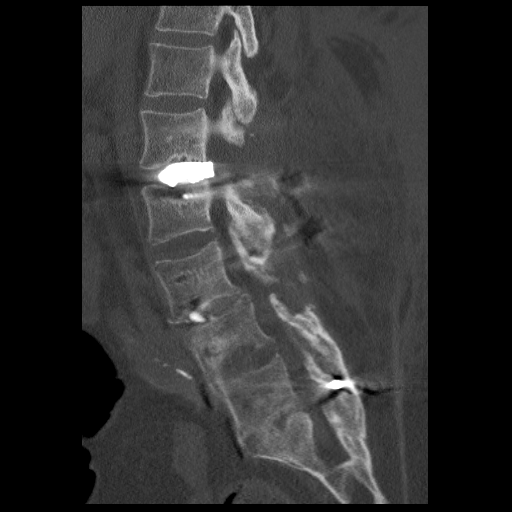
[im 37/73  soft-tissue]
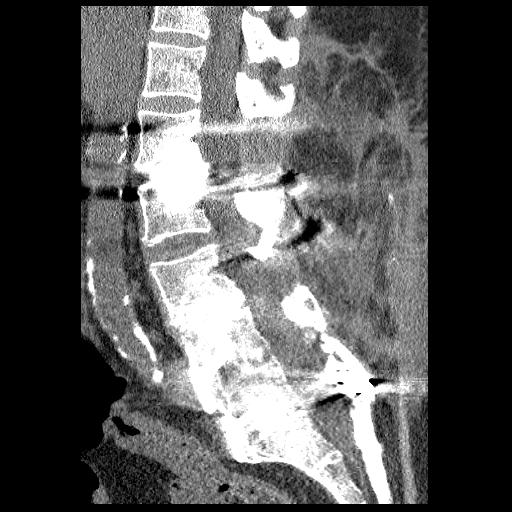
[im 37/73  bone]
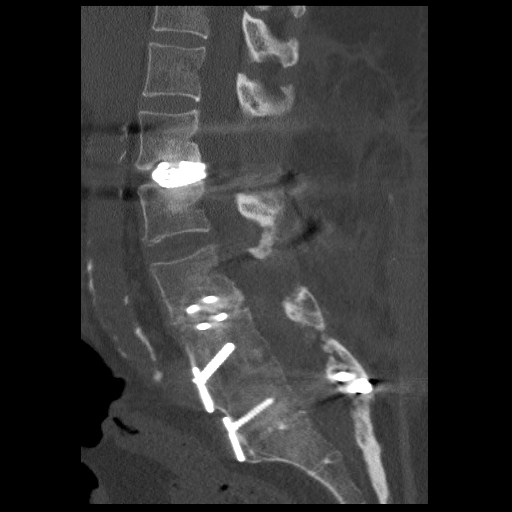
[im 43/73  bone]
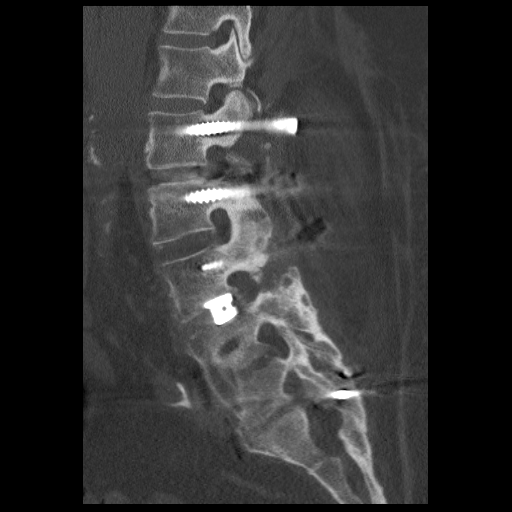
[im 49/73  bone]
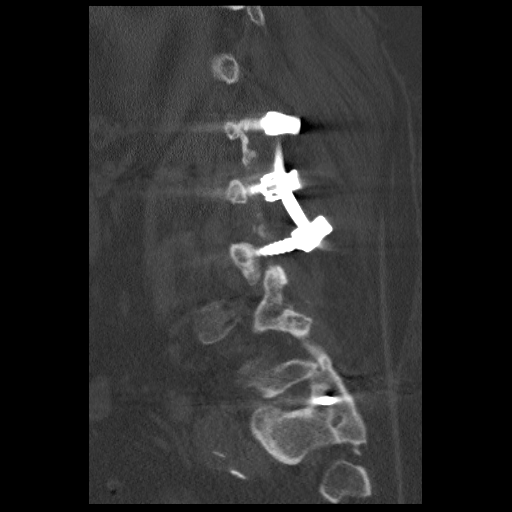

[8 of 33 positions shown; findings below may reference images not displayed]

FINDINGS: Segmentation: Transitional anatomy. Numbering scheme used previously
is continued.

Alignment: Straightening of the normal lumbar lordosis due to prior
fusion. No significant subluxation.

Vertebrae: Solid interbody arthrodesis L4-S1. Solid posterior
arthrodesis L4 through S1. Probable solid interbody arthrodesis
L3-4. Solid posterior arthrodesis L2-L3. Suspected pseudarthrosis at
L1-2. RIGHT pedicle screw has been removed. LEFT pedicle screw is
loose. Retropulsion of the RIGHT interbody cage 2 mm into the canal,
1 mm on the LEFT. Subsidence of both without osseous bridging
centrally.

Paraspinal and other soft tissues: Advanced aortic atherosclerosis.

Disc levels:

L1-L2: Retropulsed cage. No interbody fusion. No impingement. Some
flocculent bone on the RIGHT, but no solid arthrodesis posteriorly.

L2-L3:  Solid fusion.  No impingement.

L3-L4: Wide decompression. Probable solid interbody fusion. No
impingement.

L4-L5:  Solid fusion.  No impingement.

L5-S1:  Solid fusion.  No impingement.

Compared with prior exam from [facility] similar appearance. Compared with
prior exam from [facility], some retropulsion of the RIGHT greater than
LEFT cage has occurred.
IMPRESSION: Status post instrumented fusion L2 through S1 without residual
impingement, or suspected pseudarthrosis.

Probable pseudarthrosis L1-L2. See discussion above. Correlate
clinically as a source of pain.

Aortic atherosclerosis.

## 2016-11-05 DIAGNOSIS — M6281 Muscle weakness (generalized): Secondary | ICD-10-CM | POA: Diagnosis not present

## 2016-11-05 DIAGNOSIS — T8131XD Disruption of external operation (surgical) wound, not elsewhere classified, subsequent encounter: Secondary | ICD-10-CM | POA: Diagnosis not present

## 2016-11-05 DIAGNOSIS — R2689 Other abnormalities of gait and mobility: Secondary | ICD-10-CM | POA: Diagnosis not present

## 2016-11-05 DIAGNOSIS — M170 Bilateral primary osteoarthritis of knee: Secondary | ICD-10-CM | POA: Diagnosis not present

## 2016-11-05 DIAGNOSIS — M160 Bilateral primary osteoarthritis of hip: Secondary | ICD-10-CM | POA: Diagnosis not present

## 2016-11-05 DIAGNOSIS — T814XXD Infection following a procedure, subsequent encounter: Secondary | ICD-10-CM | POA: Diagnosis not present

## 2016-11-07 DIAGNOSIS — M6281 Muscle weakness (generalized): Secondary | ICD-10-CM | POA: Diagnosis not present

## 2016-11-07 DIAGNOSIS — M170 Bilateral primary osteoarthritis of knee: Secondary | ICD-10-CM | POA: Diagnosis not present

## 2016-11-07 DIAGNOSIS — M160 Bilateral primary osteoarthritis of hip: Secondary | ICD-10-CM | POA: Diagnosis not present

## 2016-11-07 DIAGNOSIS — R2689 Other abnormalities of gait and mobility: Secondary | ICD-10-CM | POA: Diagnosis not present

## 2016-11-07 DIAGNOSIS — T8131XD Disruption of external operation (surgical) wound, not elsewhere classified, subsequent encounter: Secondary | ICD-10-CM | POA: Diagnosis not present

## 2016-11-07 DIAGNOSIS — T814XXD Infection following a procedure, subsequent encounter: Secondary | ICD-10-CM | POA: Diagnosis not present

## 2016-11-08 DIAGNOSIS — T8131XD Disruption of external operation (surgical) wound, not elsewhere classified, subsequent encounter: Secondary | ICD-10-CM | POA: Diagnosis not present

## 2016-11-08 DIAGNOSIS — M170 Bilateral primary osteoarthritis of knee: Secondary | ICD-10-CM | POA: Diagnosis not present

## 2016-11-08 DIAGNOSIS — R2689 Other abnormalities of gait and mobility: Secondary | ICD-10-CM | POA: Diagnosis not present

## 2016-11-08 DIAGNOSIS — M6281 Muscle weakness (generalized): Secondary | ICD-10-CM | POA: Diagnosis not present

## 2016-11-08 DIAGNOSIS — T814XXD Infection following a procedure, subsequent encounter: Secondary | ICD-10-CM | POA: Diagnosis not present

## 2016-11-08 DIAGNOSIS — M160 Bilateral primary osteoarthritis of hip: Secondary | ICD-10-CM | POA: Diagnosis not present

## 2016-11-09 DIAGNOSIS — N952 Postmenopausal atrophic vaginitis: Secondary | ICD-10-CM | POA: Diagnosis not present

## 2016-11-09 DIAGNOSIS — R300 Dysuria: Secondary | ICD-10-CM | POA: Diagnosis not present

## 2016-11-09 DIAGNOSIS — Z01411 Encounter for gynecological examination (general) (routine) with abnormal findings: Secondary | ICD-10-CM | POA: Diagnosis not present

## 2016-11-12 DIAGNOSIS — T814XXD Infection following a procedure, subsequent encounter: Secondary | ICD-10-CM | POA: Diagnosis not present

## 2016-11-12 DIAGNOSIS — T8131XD Disruption of external operation (surgical) wound, not elsewhere classified, subsequent encounter: Secondary | ICD-10-CM | POA: Diagnosis not present

## 2016-11-12 DIAGNOSIS — M170 Bilateral primary osteoarthritis of knee: Secondary | ICD-10-CM | POA: Diagnosis not present

## 2016-11-12 DIAGNOSIS — R2689 Other abnormalities of gait and mobility: Secondary | ICD-10-CM | POA: Diagnosis not present

## 2016-11-12 DIAGNOSIS — M6281 Muscle weakness (generalized): Secondary | ICD-10-CM | POA: Diagnosis not present

## 2016-11-12 DIAGNOSIS — M160 Bilateral primary osteoarthritis of hip: Secondary | ICD-10-CM | POA: Diagnosis not present

## 2016-11-13 DIAGNOSIS — M5137 Other intervertebral disc degeneration, lumbosacral region: Secondary | ICD-10-CM | POA: Diagnosis not present

## 2016-11-14 DIAGNOSIS — M6281 Muscle weakness (generalized): Secondary | ICD-10-CM | POA: Diagnosis not present

## 2016-11-14 DIAGNOSIS — T8131XD Disruption of external operation (surgical) wound, not elsewhere classified, subsequent encounter: Secondary | ICD-10-CM | POA: Diagnosis not present

## 2016-11-14 DIAGNOSIS — R2689 Other abnormalities of gait and mobility: Secondary | ICD-10-CM | POA: Diagnosis not present

## 2016-11-14 DIAGNOSIS — T814XXD Infection following a procedure, subsequent encounter: Secondary | ICD-10-CM | POA: Diagnosis not present

## 2016-11-14 DIAGNOSIS — M160 Bilateral primary osteoarthritis of hip: Secondary | ICD-10-CM | POA: Diagnosis not present

## 2016-11-14 DIAGNOSIS — M170 Bilateral primary osteoarthritis of knee: Secondary | ICD-10-CM | POA: Diagnosis not present

## 2016-11-16 DIAGNOSIS — R2689 Other abnormalities of gait and mobility: Secondary | ICD-10-CM | POA: Diagnosis not present

## 2016-11-16 DIAGNOSIS — T8131XD Disruption of external operation (surgical) wound, not elsewhere classified, subsequent encounter: Secondary | ICD-10-CM | POA: Diagnosis not present

## 2016-11-16 DIAGNOSIS — M6281 Muscle weakness (generalized): Secondary | ICD-10-CM | POA: Diagnosis not present

## 2016-11-16 DIAGNOSIS — M170 Bilateral primary osteoarthritis of knee: Secondary | ICD-10-CM | POA: Diagnosis not present

## 2016-11-16 DIAGNOSIS — T814XXD Infection following a procedure, subsequent encounter: Secondary | ICD-10-CM | POA: Diagnosis not present

## 2016-11-16 DIAGNOSIS — M160 Bilateral primary osteoarthritis of hip: Secondary | ICD-10-CM | POA: Diagnosis not present

## 2016-11-19 ENCOUNTER — Other Ambulatory Visit: Payer: Self-pay | Admitting: Family Medicine

## 2016-11-19 DIAGNOSIS — T814XXD Infection following a procedure, subsequent encounter: Secondary | ICD-10-CM | POA: Diagnosis not present

## 2016-11-19 DIAGNOSIS — M6281 Muscle weakness (generalized): Secondary | ICD-10-CM | POA: Diagnosis not present

## 2016-11-19 DIAGNOSIS — R2689 Other abnormalities of gait and mobility: Secondary | ICD-10-CM | POA: Diagnosis not present

## 2016-11-19 DIAGNOSIS — Z1231 Encounter for screening mammogram for malignant neoplasm of breast: Secondary | ICD-10-CM

## 2016-11-19 DIAGNOSIS — M160 Bilateral primary osteoarthritis of hip: Secondary | ICD-10-CM | POA: Diagnosis not present

## 2016-11-19 DIAGNOSIS — M170 Bilateral primary osteoarthritis of knee: Secondary | ICD-10-CM | POA: Diagnosis not present

## 2016-11-19 DIAGNOSIS — T8131XD Disruption of external operation (surgical) wound, not elsewhere classified, subsequent encounter: Secondary | ICD-10-CM | POA: Diagnosis not present

## 2016-11-20 DIAGNOSIS — Z09 Encounter for follow-up examination after completed treatment for conditions other than malignant neoplasm: Secondary | ICD-10-CM | POA: Diagnosis not present

## 2016-11-21 DIAGNOSIS — R2689 Other abnormalities of gait and mobility: Secondary | ICD-10-CM | POA: Diagnosis not present

## 2016-11-21 DIAGNOSIS — M160 Bilateral primary osteoarthritis of hip: Secondary | ICD-10-CM | POA: Diagnosis not present

## 2016-11-21 DIAGNOSIS — M170 Bilateral primary osteoarthritis of knee: Secondary | ICD-10-CM | POA: Diagnosis not present

## 2016-11-21 DIAGNOSIS — M6281 Muscle weakness (generalized): Secondary | ICD-10-CM | POA: Diagnosis not present

## 2016-11-21 DIAGNOSIS — T8131XD Disruption of external operation (surgical) wound, not elsewhere classified, subsequent encounter: Secondary | ICD-10-CM | POA: Diagnosis not present

## 2016-11-21 DIAGNOSIS — T814XXD Infection following a procedure, subsequent encounter: Secondary | ICD-10-CM | POA: Diagnosis not present

## 2016-11-23 IMAGING — US US ABDOMEN LIMITED
1 series · 14 of 22 positions shown · non-contrast
Comparison: CT of the abdomen and pelvis from 07/02/2016

CLINICAL DATA: Elevated LFTs.  Initial encounter.

EXAM:
US ABDOMEN LIMITED - RIGHT UPPER QUADRANT

[Series 1: us abdomen limited · 0.30mm/px · 14 of 22 slices shown]
[im 1/22]
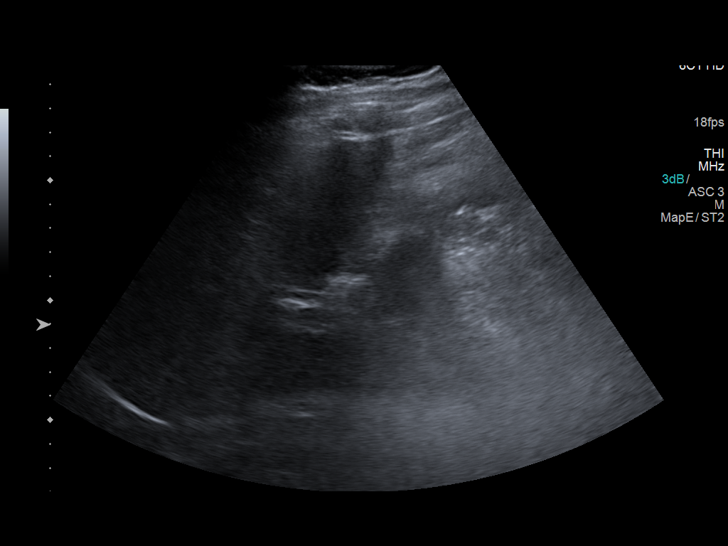
[im 3/22]
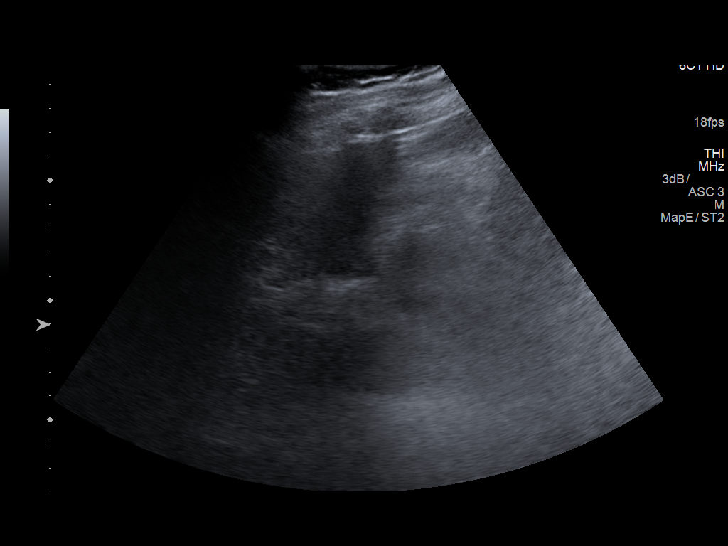
[im 4/22]
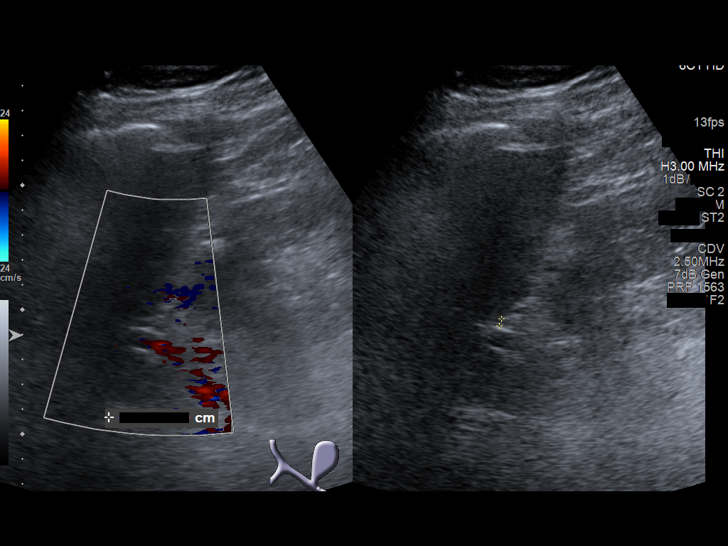
[im 6/22]
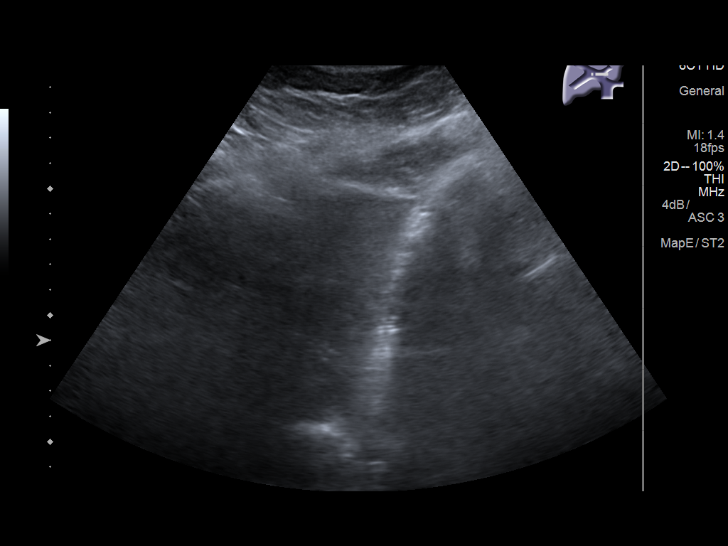
[im 8/22]
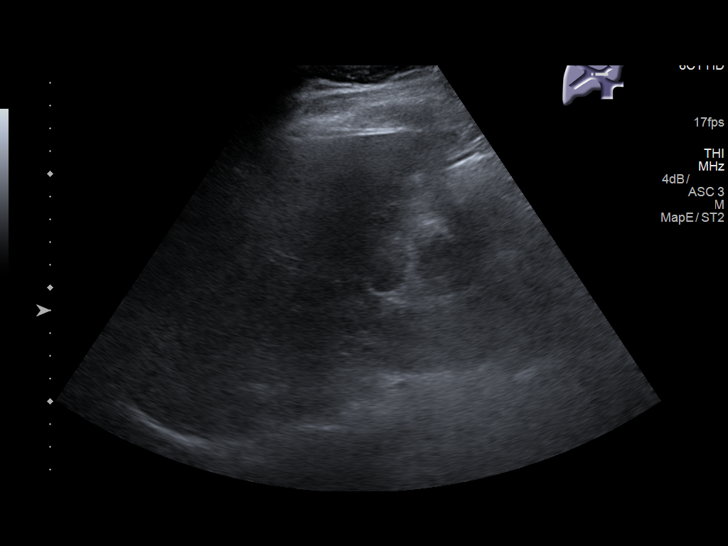
[im 9/22]
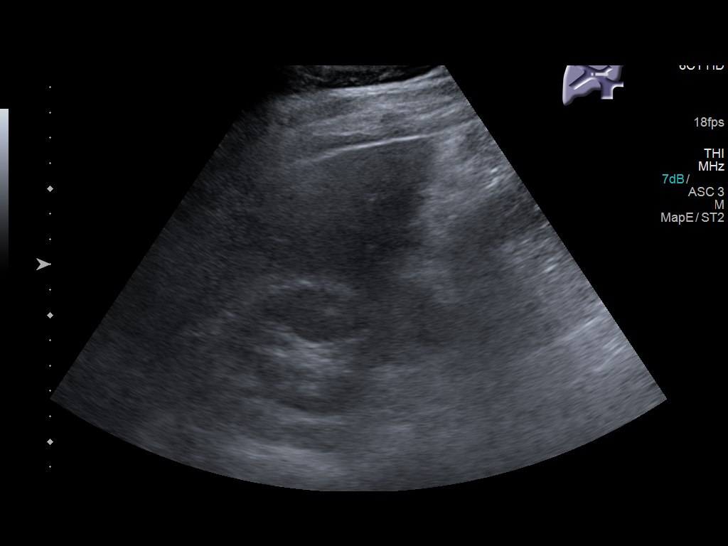
[im 11/22]
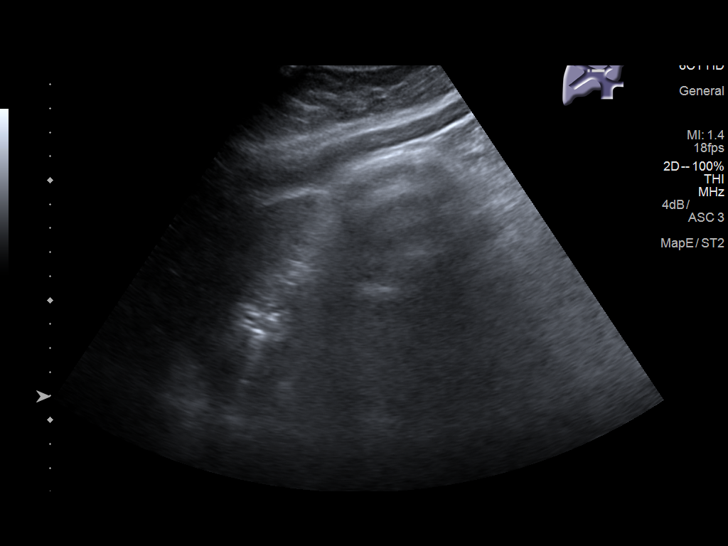
[im 12/22]
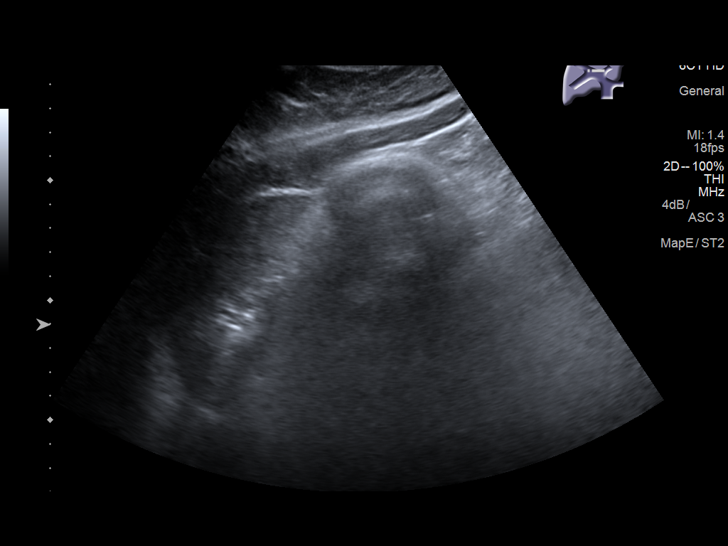
[im 14/22]
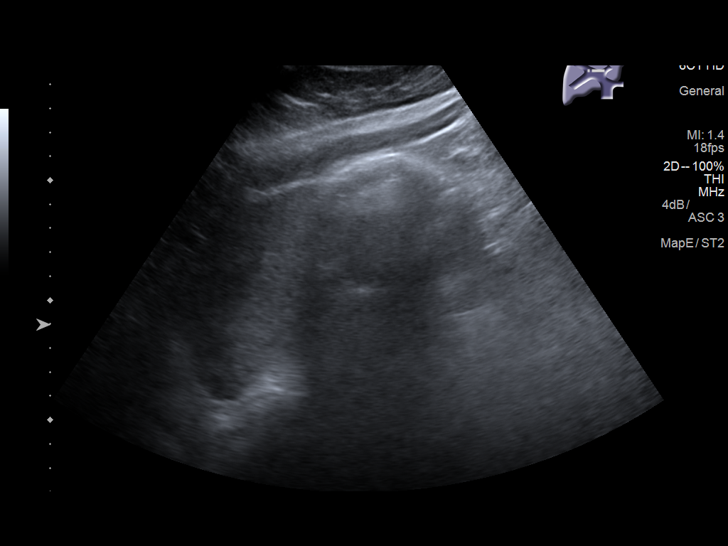
[im 15/22]
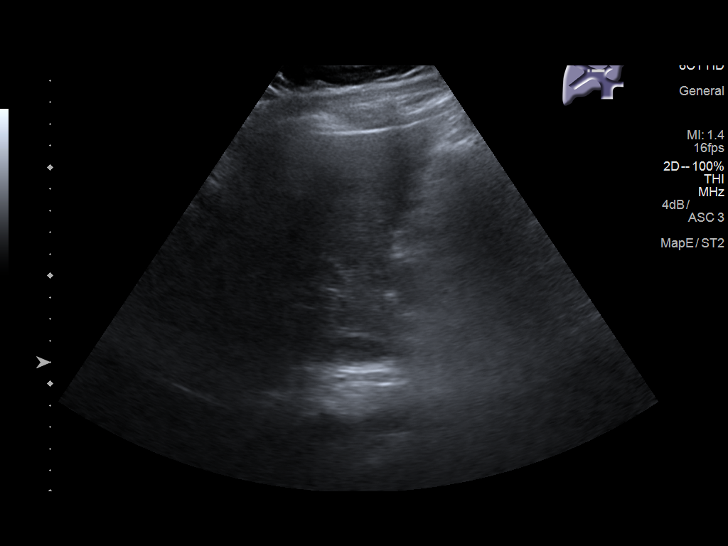
[im 17/22]
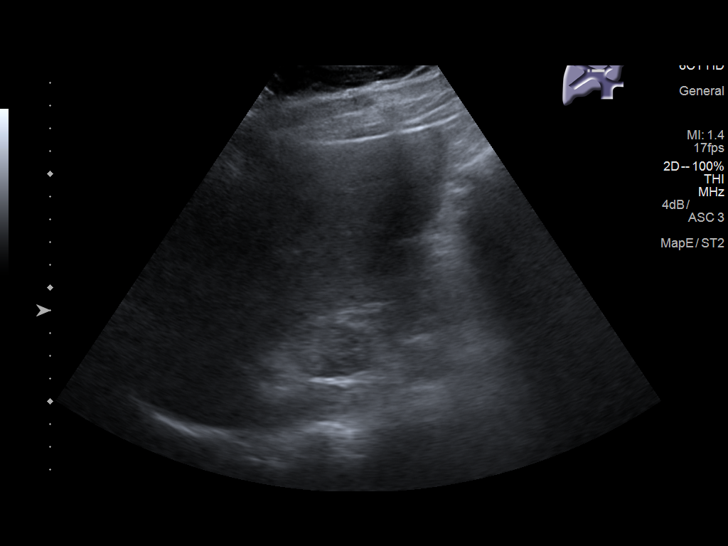
[im 19/22]
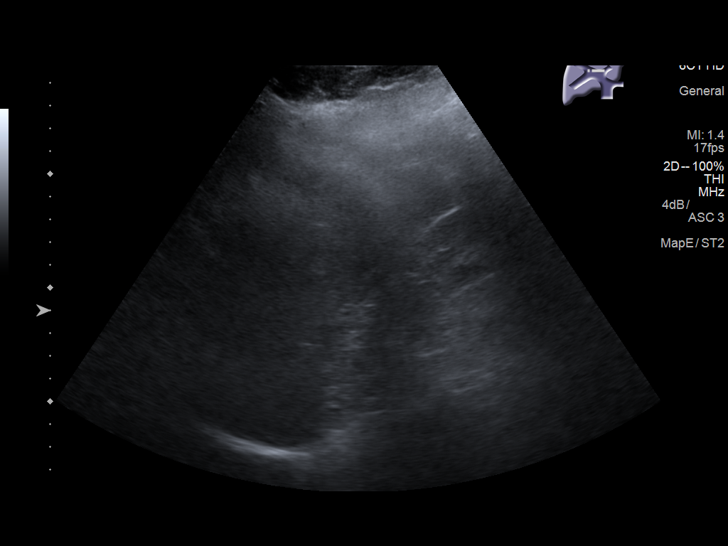
[im 20/22]
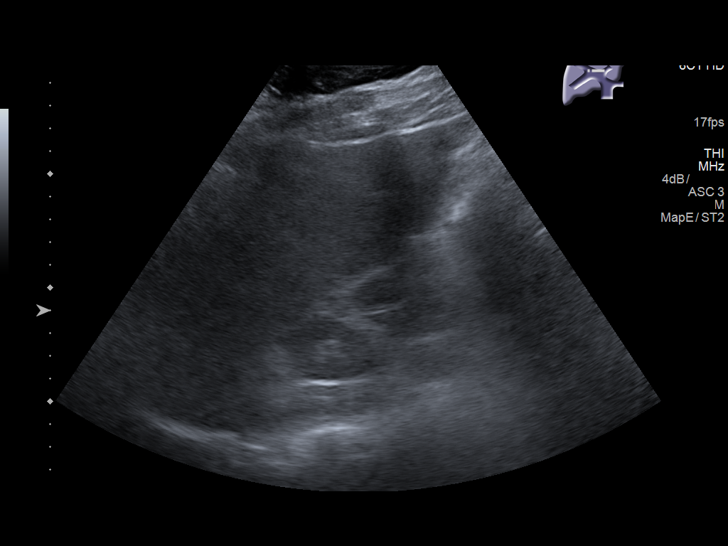
[im 22/22]
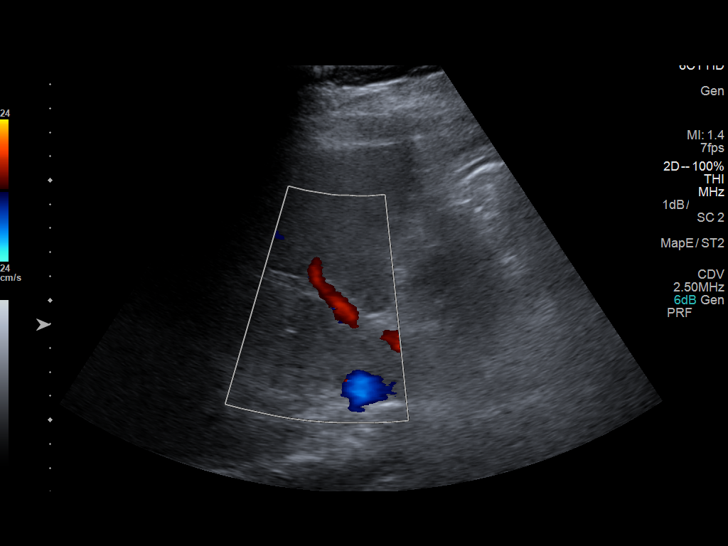

[14 of 22 positions shown; findings below may reference images not displayed]

FINDINGS: Gallbladder:

Status post cholecystectomy.  No retained stones seen.

Common bile duct:

Diameter: 0.3 cm, within normal limits in caliber. Difficult to
fully characterize due to overlying structures.

Liver:

No focal lesion identified. Within normal limits in parenchymal
echogenicity.
IMPRESSION: Status post cholecystectomy. No retained stones seen. Unremarkable
ultrasound of the right upper quadrant.

On further assessment of the recent CT of the abdomen and pelvis,
there is soft tissue inflammation tracking about the pancreatic head
and second segment of the duodenum. Given clinical concern, this
could reflect mild acute pancreatitis. There is no evidence of
devascularization or pseudocyst formation on the recent prior CT.
Note that the pancreas is typically not well assessed on ultrasound.

## 2016-11-26 DIAGNOSIS — T8131XD Disruption of external operation (surgical) wound, not elsewhere classified, subsequent encounter: Secondary | ICD-10-CM | POA: Diagnosis not present

## 2016-11-26 DIAGNOSIS — M160 Bilateral primary osteoarthritis of hip: Secondary | ICD-10-CM | POA: Diagnosis not present

## 2016-11-26 DIAGNOSIS — T814XXD Infection following a procedure, subsequent encounter: Secondary | ICD-10-CM | POA: Diagnosis not present

## 2016-11-26 DIAGNOSIS — M170 Bilateral primary osteoarthritis of knee: Secondary | ICD-10-CM | POA: Diagnosis not present

## 2016-11-26 DIAGNOSIS — R2689 Other abnormalities of gait and mobility: Secondary | ICD-10-CM | POA: Diagnosis not present

## 2016-11-26 DIAGNOSIS — M6281 Muscle weakness (generalized): Secondary | ICD-10-CM | POA: Diagnosis not present

## 2016-11-28 DIAGNOSIS — T814XXD Infection following a procedure, subsequent encounter: Secondary | ICD-10-CM | POA: Diagnosis not present

## 2016-11-28 DIAGNOSIS — R2689 Other abnormalities of gait and mobility: Secondary | ICD-10-CM | POA: Diagnosis not present

## 2016-11-28 DIAGNOSIS — M170 Bilateral primary osteoarthritis of knee: Secondary | ICD-10-CM | POA: Diagnosis not present

## 2016-11-28 DIAGNOSIS — M6281 Muscle weakness (generalized): Secondary | ICD-10-CM | POA: Diagnosis not present

## 2016-11-28 DIAGNOSIS — T8131XD Disruption of external operation (surgical) wound, not elsewhere classified, subsequent encounter: Secondary | ICD-10-CM | POA: Diagnosis not present

## 2016-11-28 DIAGNOSIS — M160 Bilateral primary osteoarthritis of hip: Secondary | ICD-10-CM | POA: Diagnosis not present

## 2016-11-28 IMAGING — CR DG KNEE 1-2V*R*
1 series · 2 of 2 positions shown · non-contrast
Comparison: None.

CLINICAL DATA: Right knee pain, no known injury, initial encounter

EXAM:
RIGHT KNEE - 1-2 VIEW

[Series 1: dg knee 1-2 views right · 0.14mm/px · 2 of 2 slices shown]
[im 1/2]
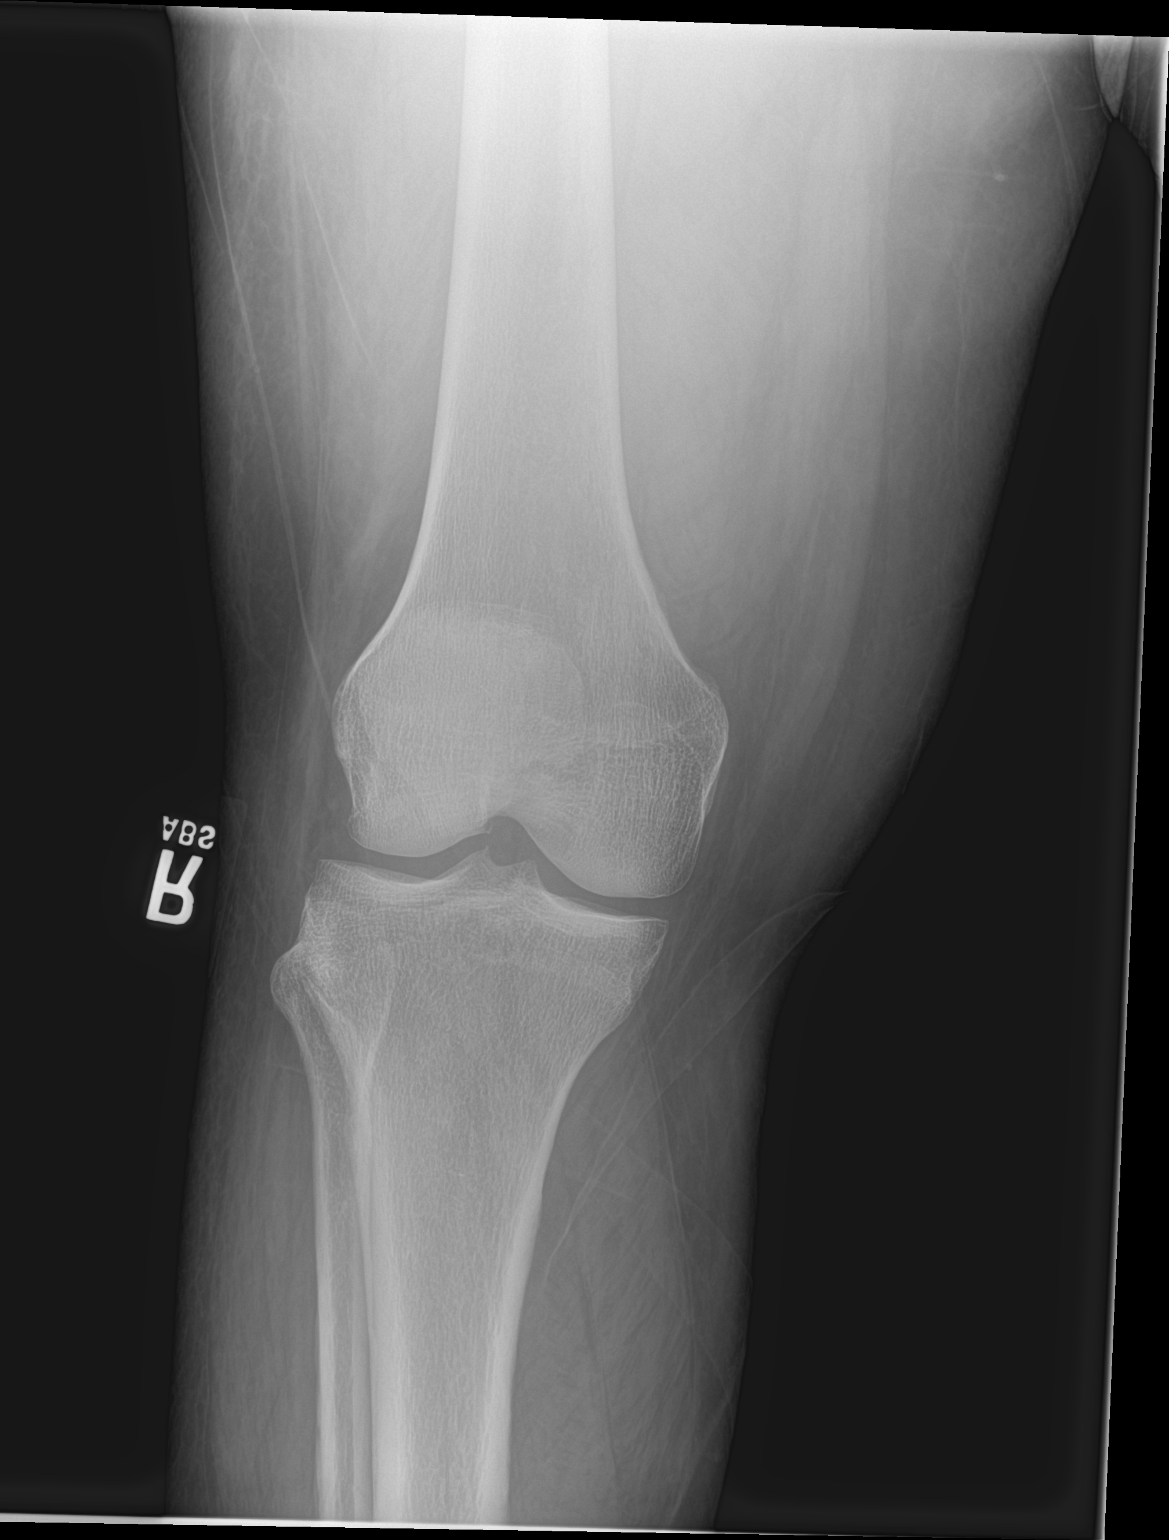
[im 2/2]
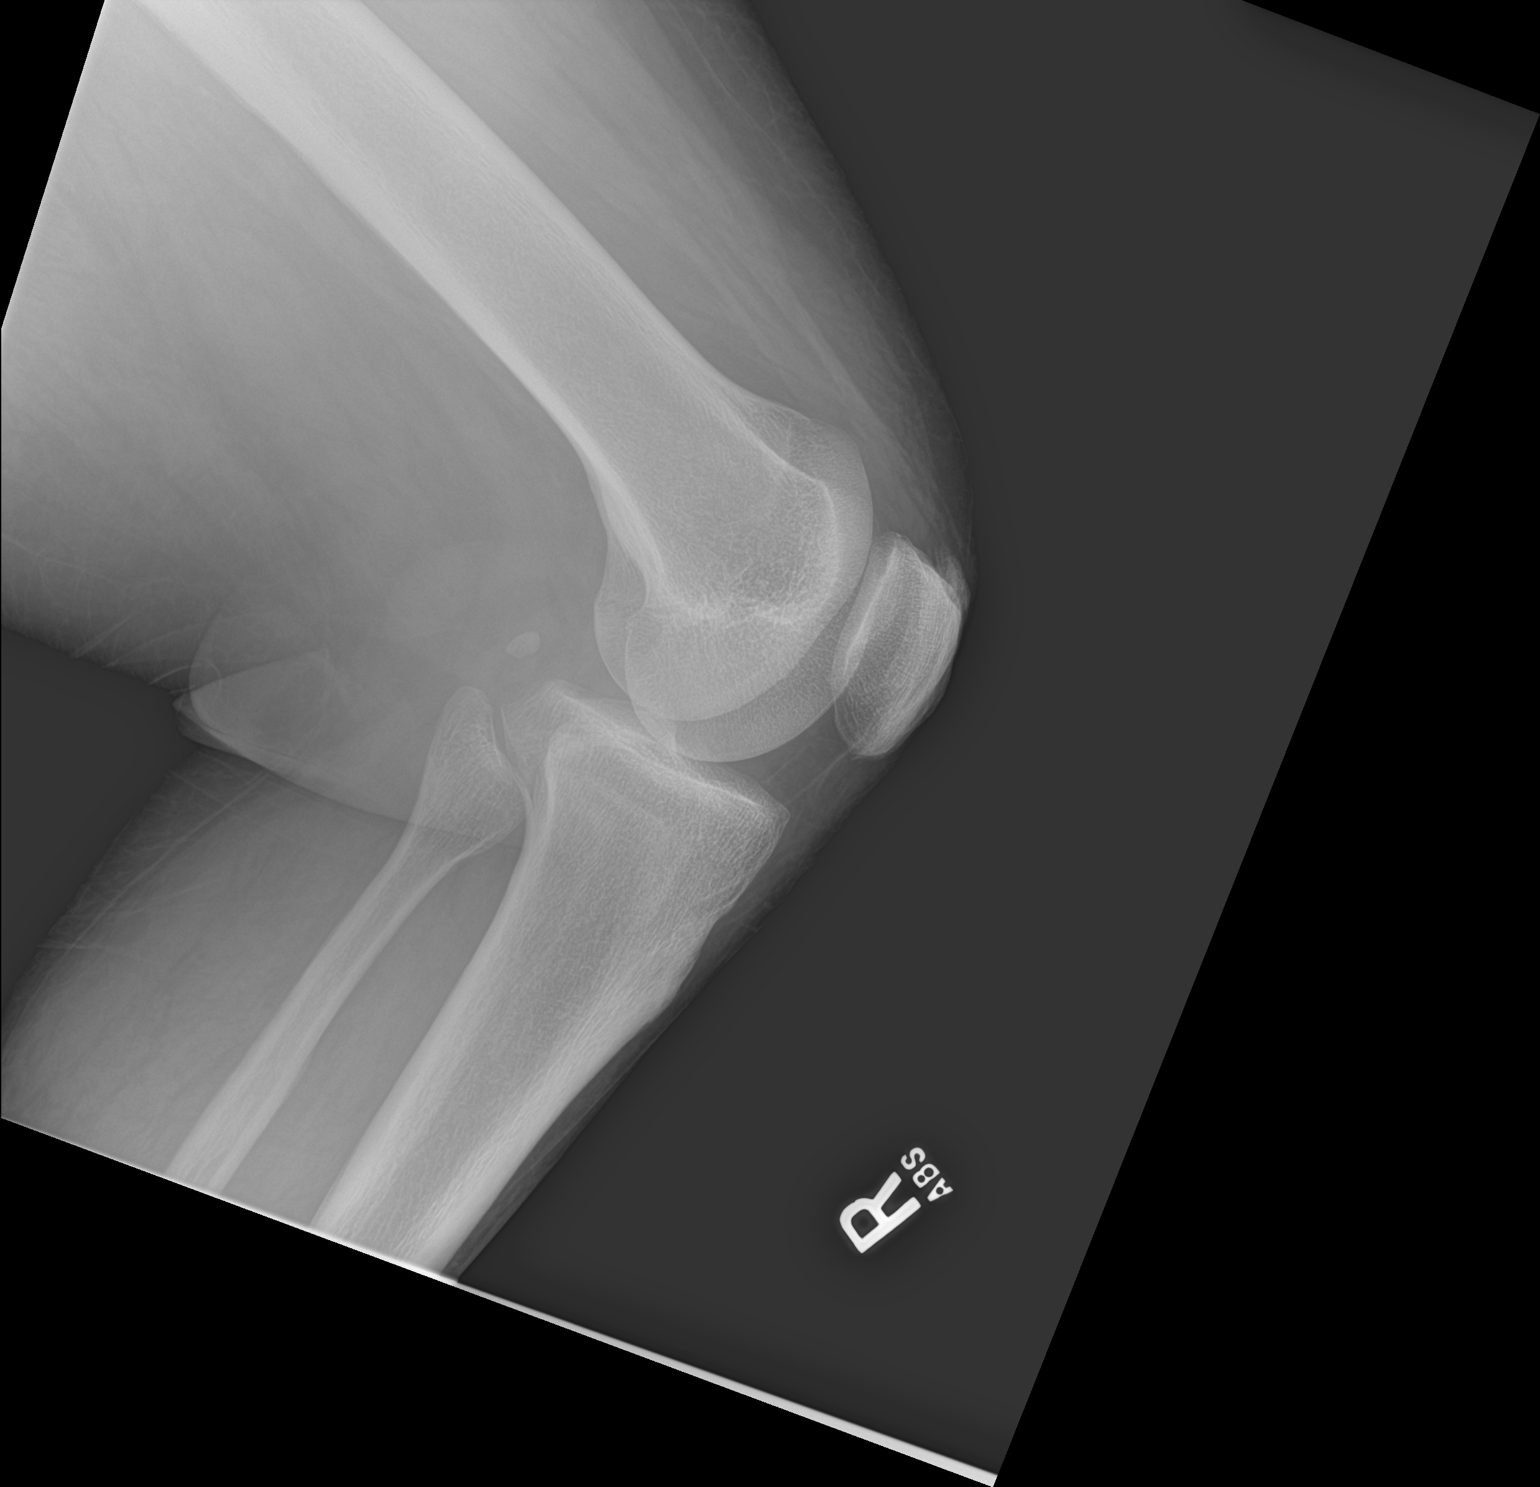

[2 of 2 positions shown; findings below may reference images not displayed]

FINDINGS: No evidence of fracture, dislocation, or joint effusion. Mild medial
joint space narrowing is noted. No soft tissue abnormality is seen.
IMPRESSION: Minimal degenerative changes.

## 2016-11-28 IMAGING — CR DG HIP (WITH OR WITHOUT PELVIS) 2-3V*R*
1 series · 3 of 3 positions shown · non-contrast
Comparison: CT 06/01/2016.  MRI 02/08/2016.

CLINICAL DATA: Chronic pain.  No reported injury.

EXAM:
DG HIP (WITH OR WITHOUT PELVIS) 2-3V RIGHT

[Series 1: dg hip unilat w or w/o pelvis 2-3 views  · non-contrast · 0.14mm/px · 3 of 3 slices shown]
[im 1/3]
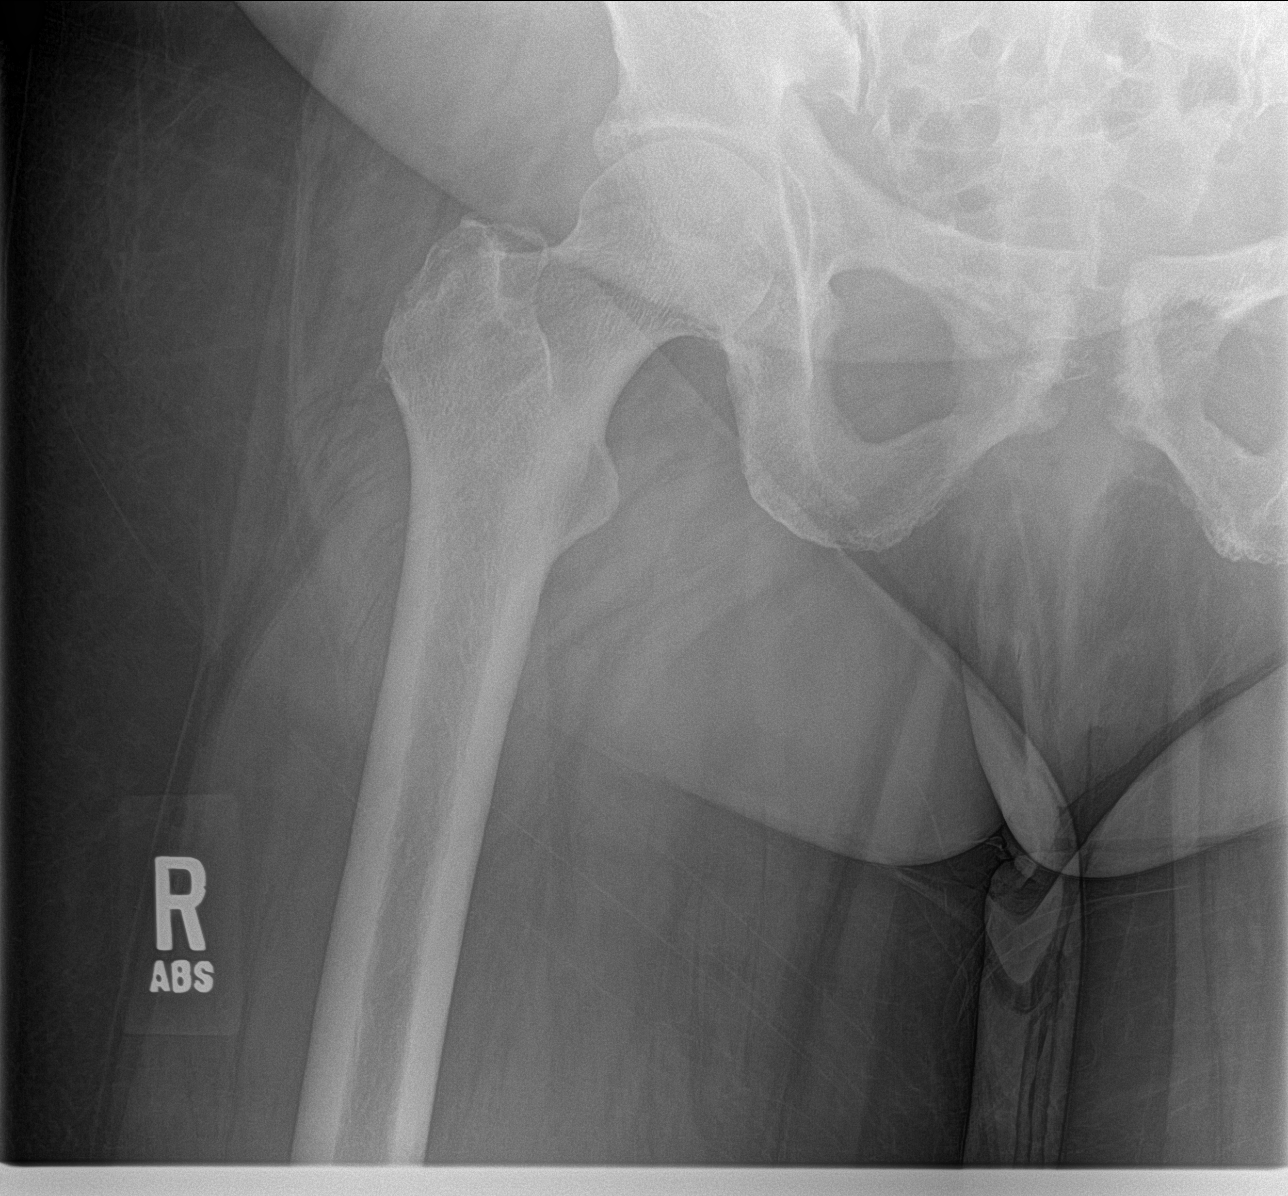
[im 2/3]
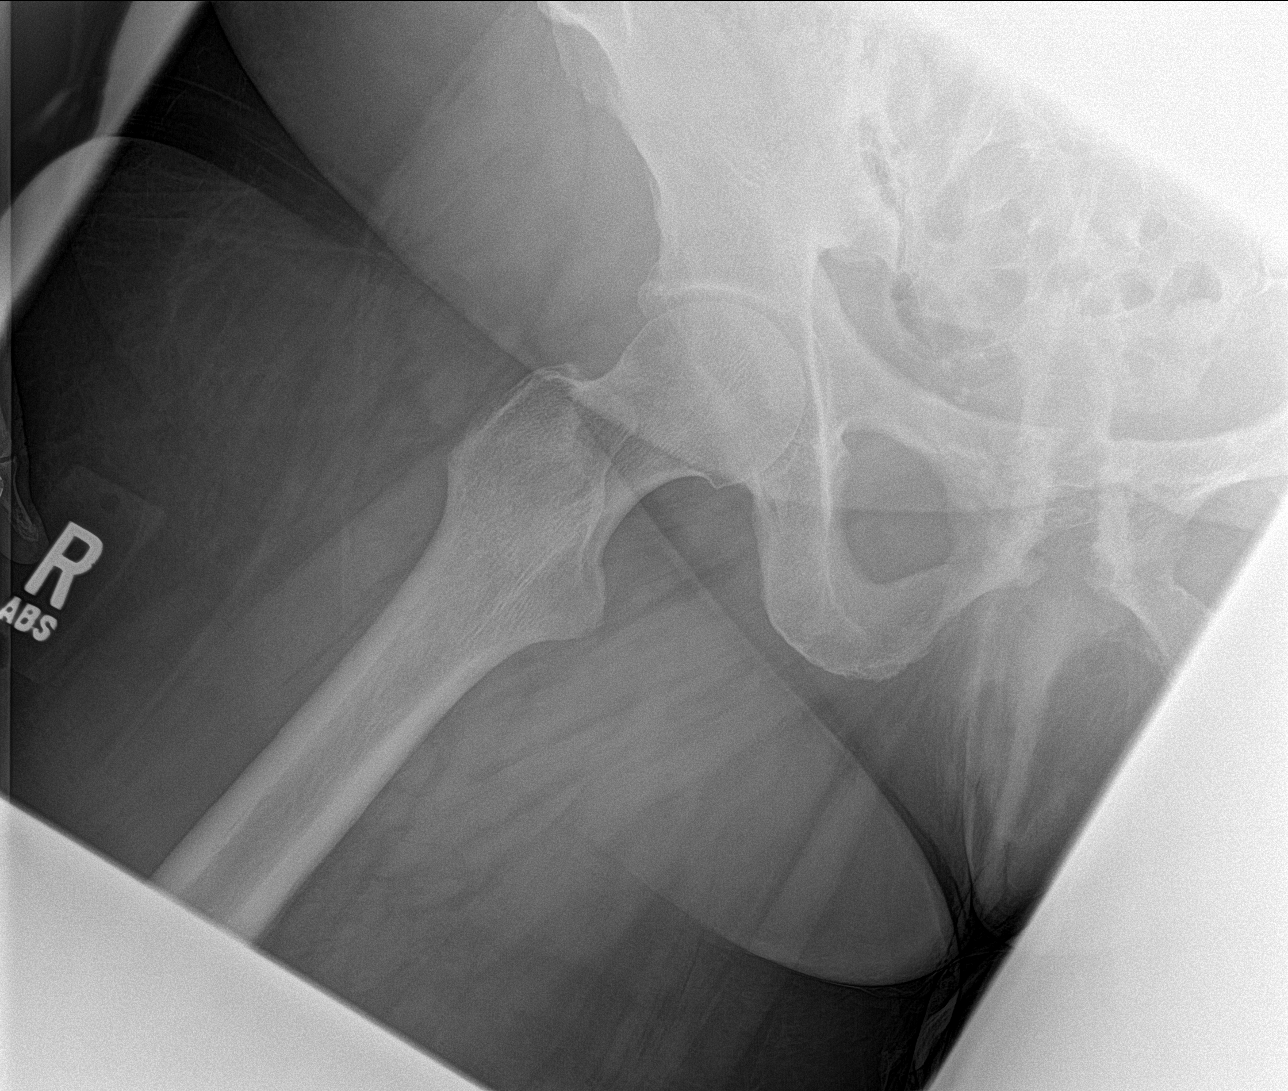
[im 3/3]
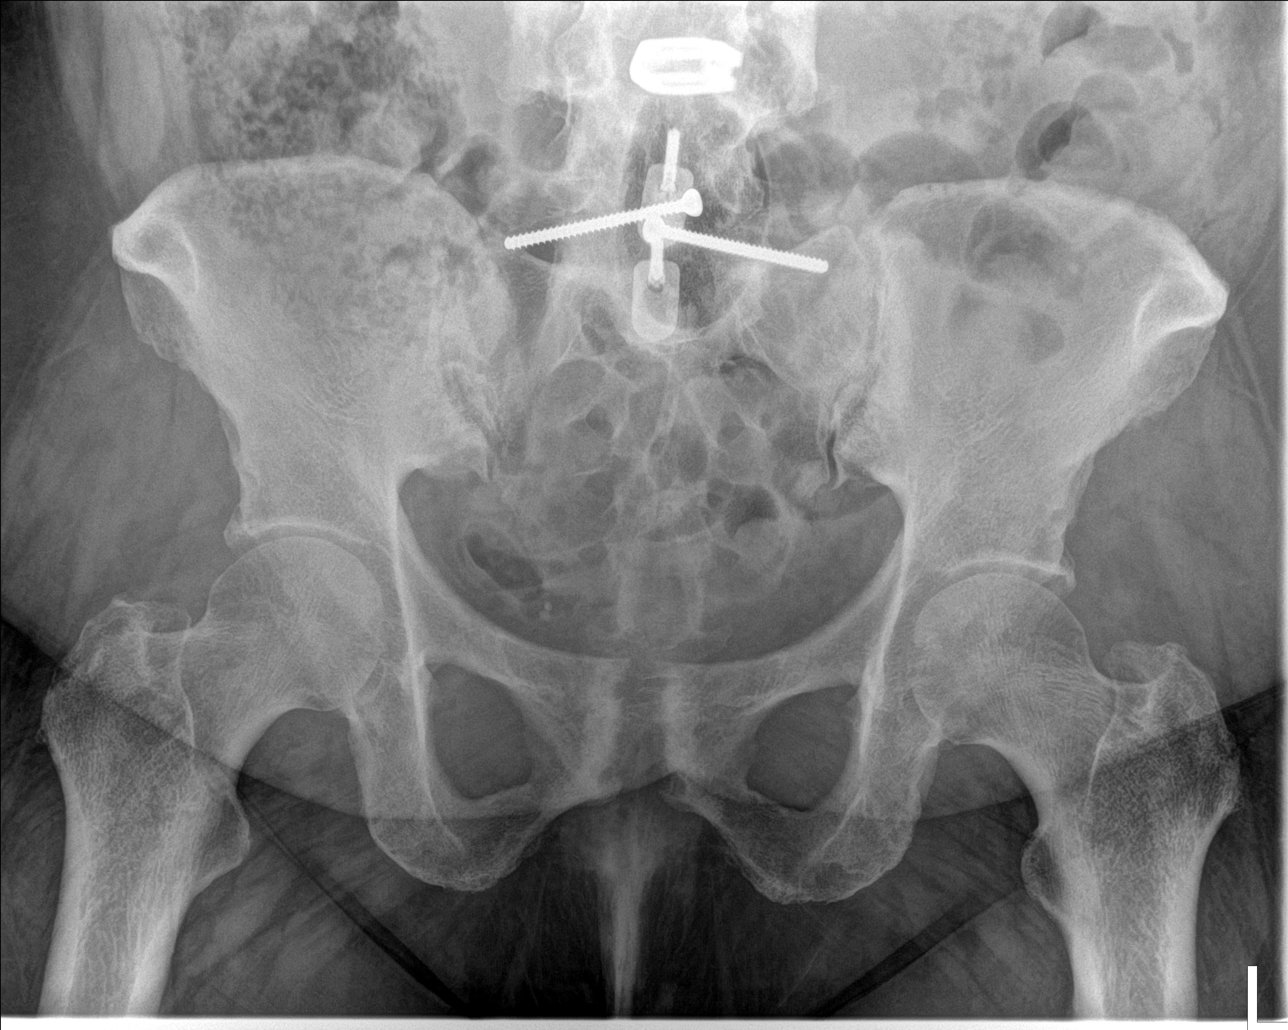

[3 of 3 positions shown; findings below may reference images not displayed]

FINDINGS: Lumbosacral spine fusion. Degenerative changes lumbar spine, both SI
joints, both hips. Osteitis pubis again noted. No acute abnormality.
No evidence of fracture dislocation.
IMPRESSION: Lumbosacral spine fusion. Degenerative changes lumbar spine, both SI
joints, both hips. Osteitis pubis again noted. No acute bony
abnormality identified .

## 2016-11-28 IMAGING — CR DG LUMBAR SPINE COMPLETE W/ BEND
1 series · 7 of 7 positions shown · non-contrast
Comparison: 05/22/2016

CLINICAL DATA: Low back pain, no known injury, initial encounter

EXAM:
LUMBAR SPINE - COMPLETE WITH BENDING VIEWS

[Series 1: dg lumbar spine complete w/bend 6+v · 0.14mm/px · 7 of 7 slices shown]
[im 1/7]
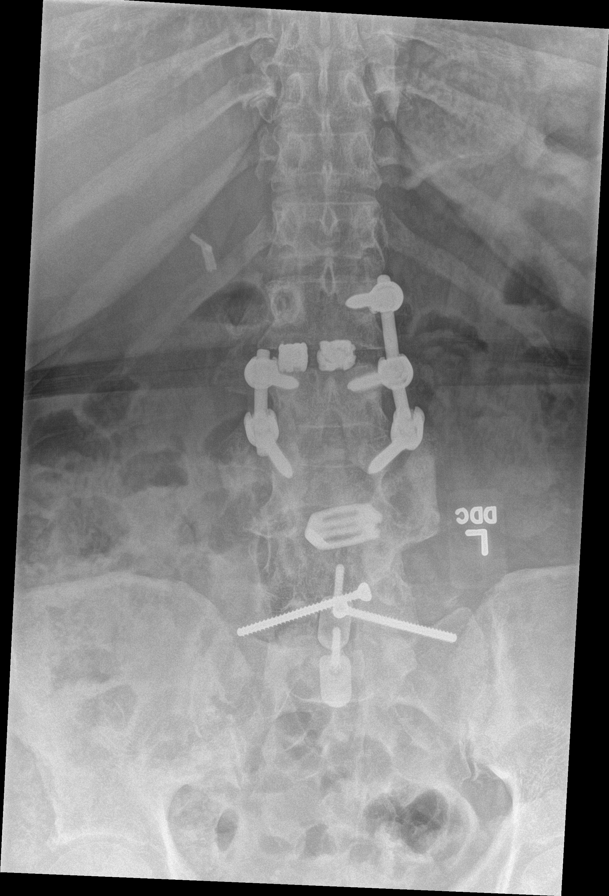
[im 2/7]
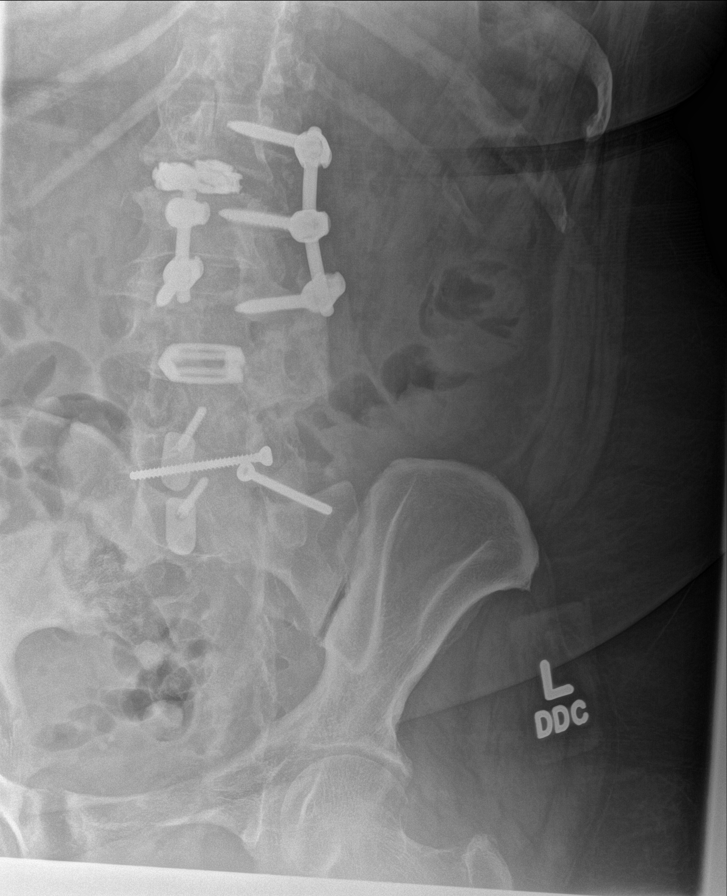
[im 3/7]
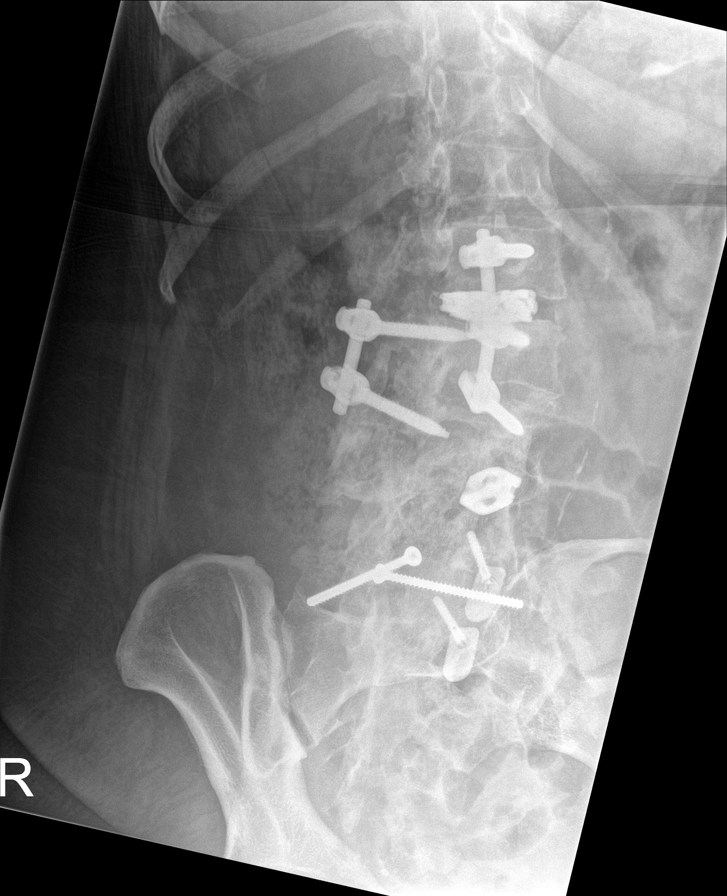
[im 4/7]
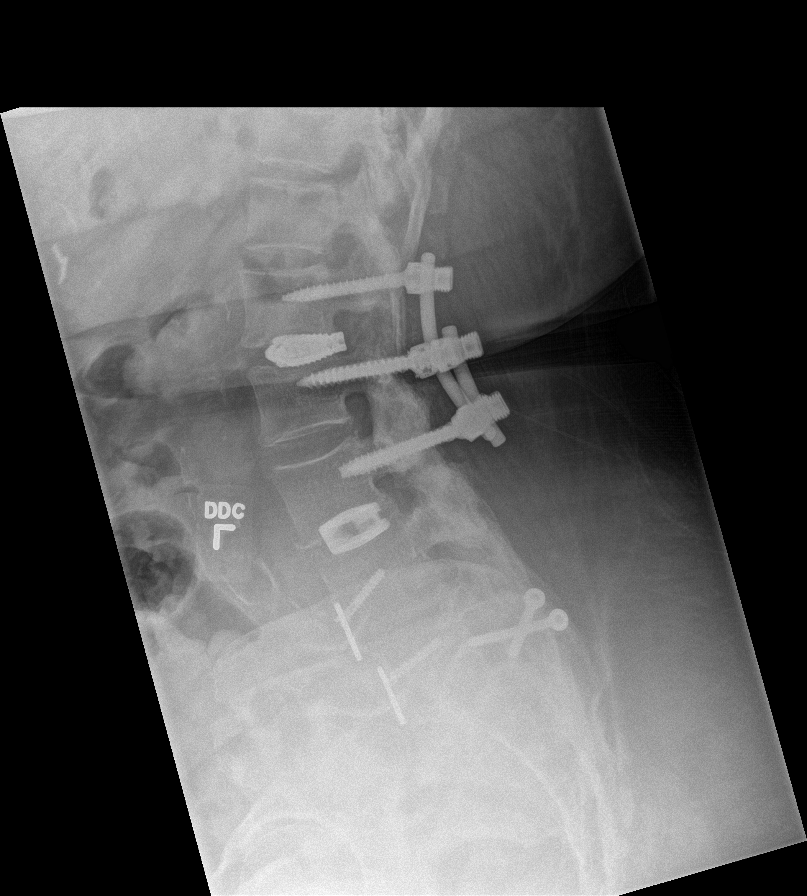
[im 5/7]
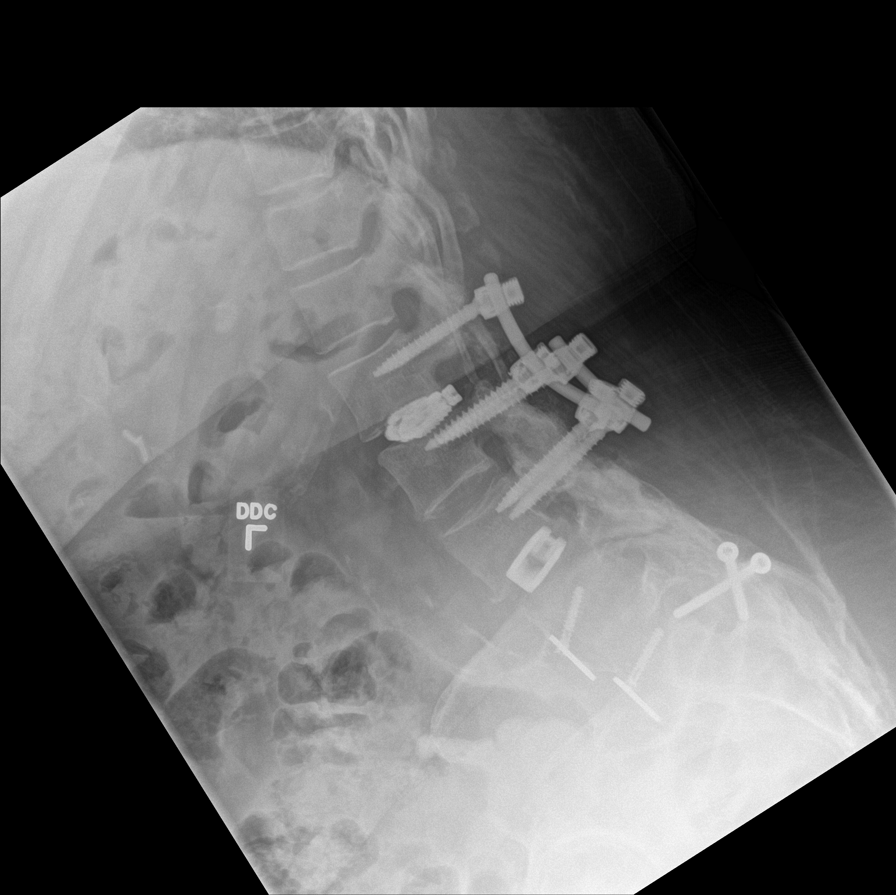
[im 6/7]
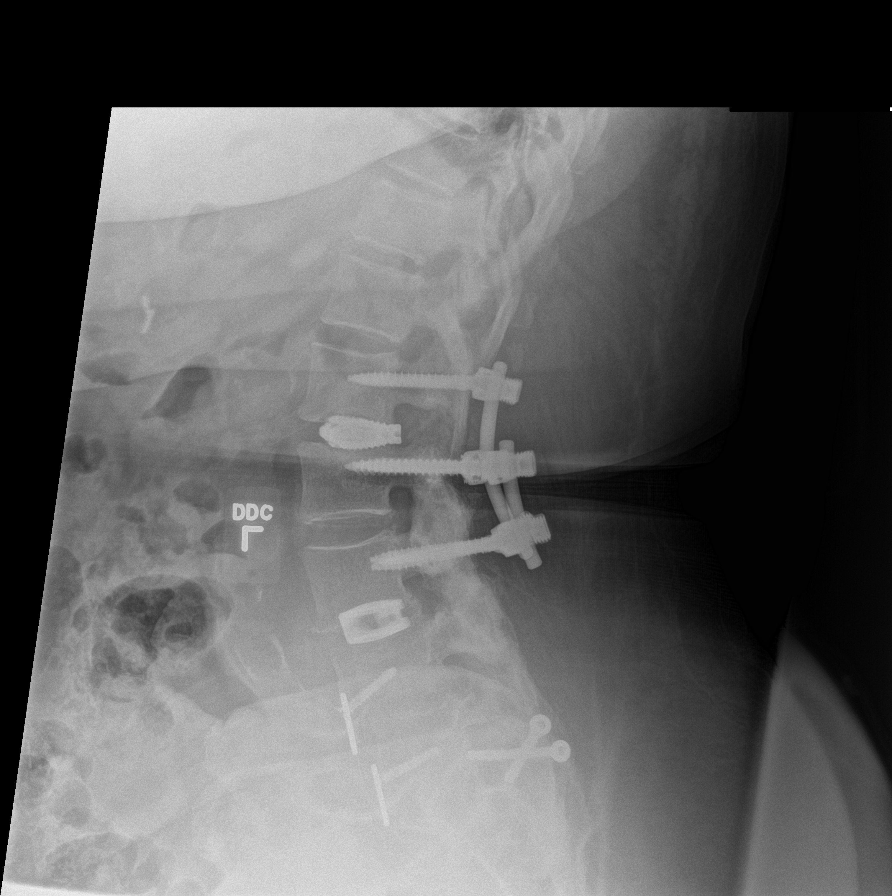
[im 7/7]
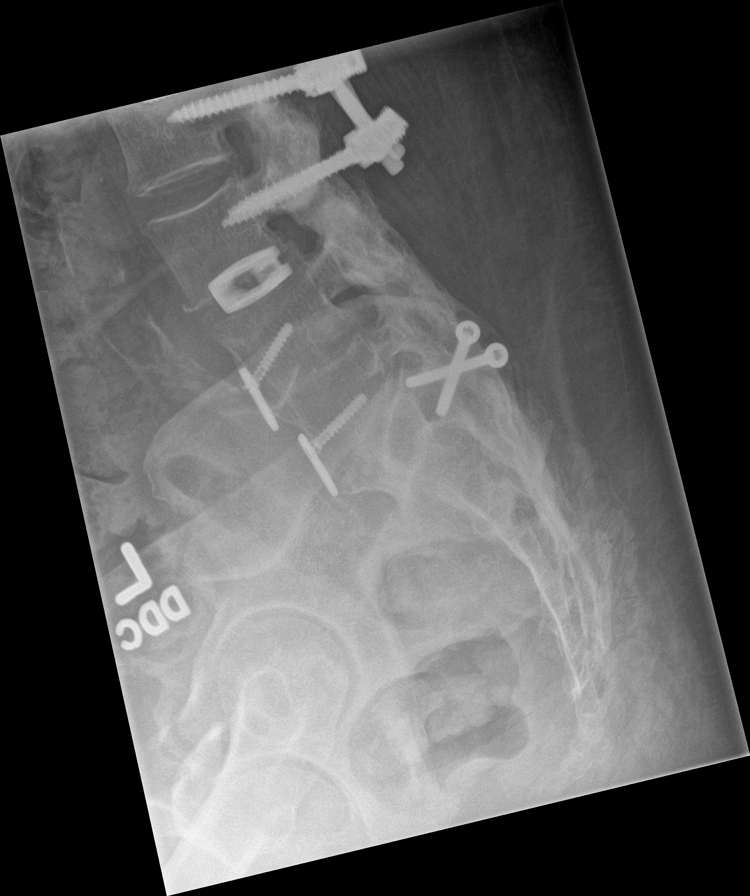

[7 of 7 positions shown; findings below may reference images not displayed]

FINDINGS: Multilevel postoperative changes are noted throughout the lumbar
spine. The overall appearance of the hardware is stable. Vertebral
body height is well maintained. Aortic calcifications are seen.
Flexion and extension views show no definitive instability.
IMPRESSION: Multilevel postoperative changes. No instability is seen on flexion
and extension. No acute abnormality is noted.

## 2016-11-28 IMAGING — CR DG SHOULDER 2+V*L*
1 series · 3 of 3 positions shown · non-contrast
Comparison: 08/16/2009.

CLINICAL DATA: Chronic pain.  No reported injury.

EXAM:
LEFT SHOULDER - 2+ VIEW

[Series 1: dg shoulder left · 0.14mm/px · 3 of 3 slices shown]
[im 1/3]
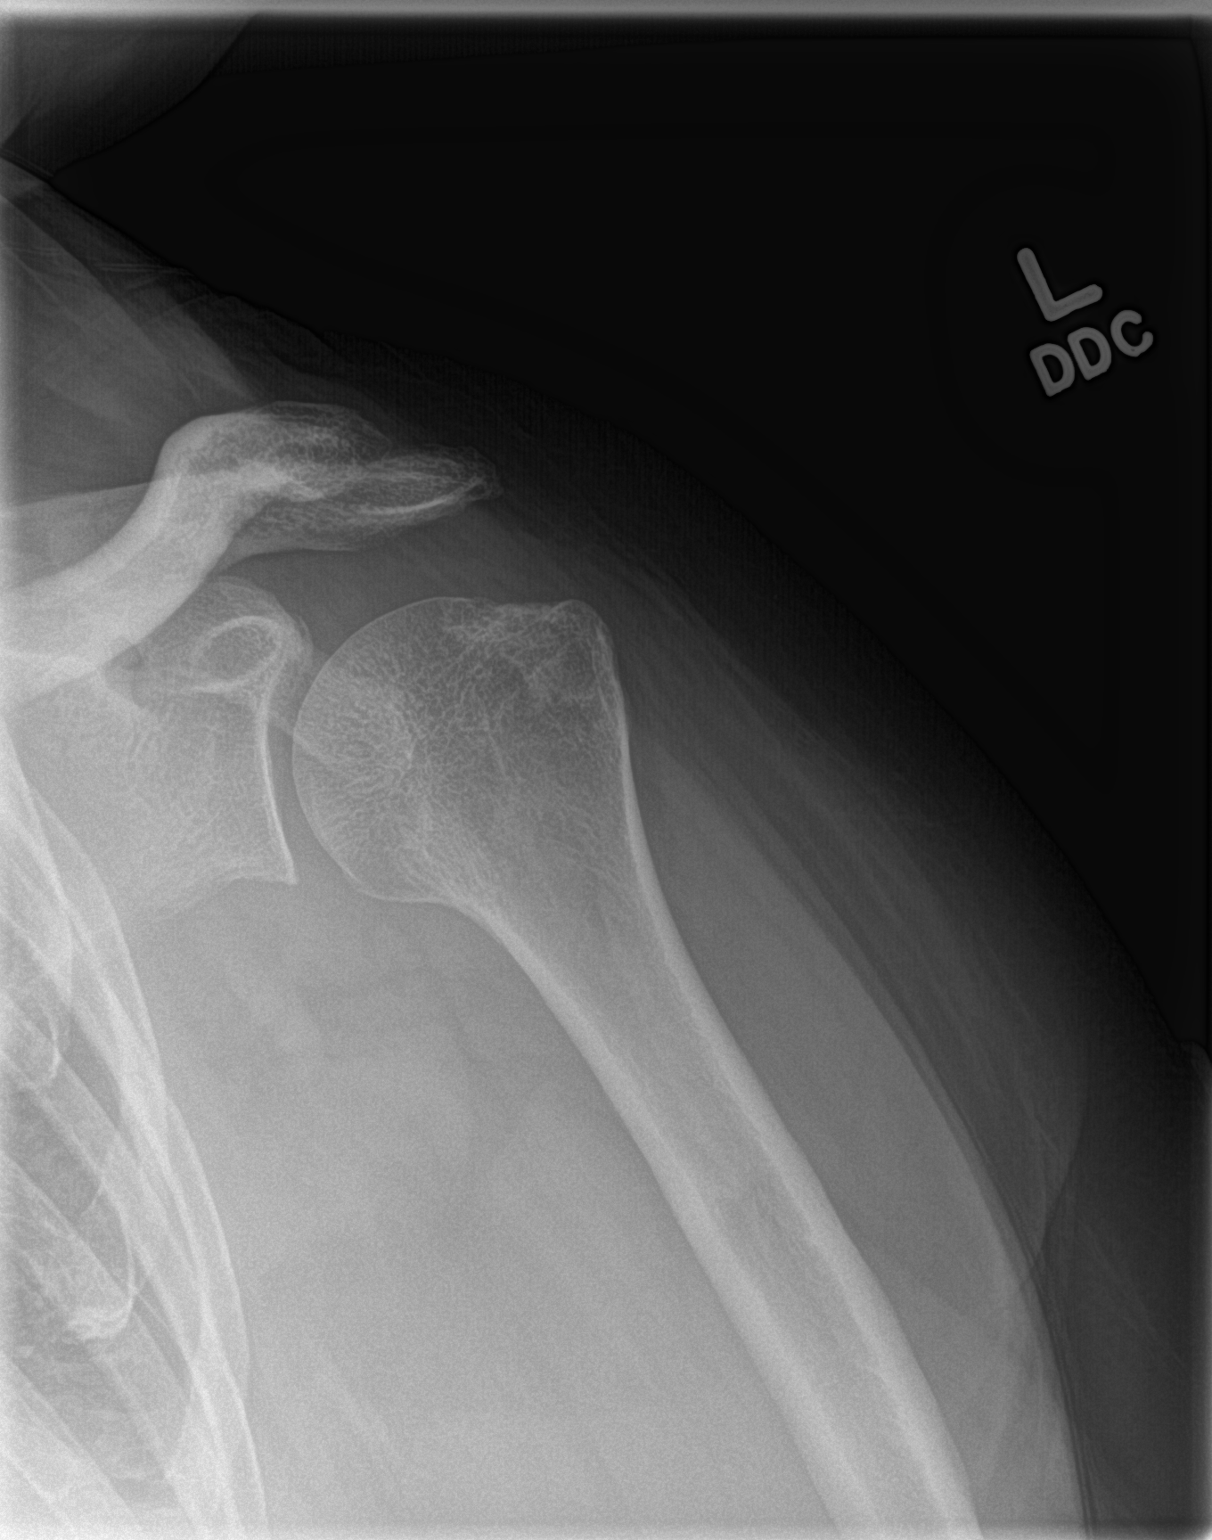
[im 2/3]
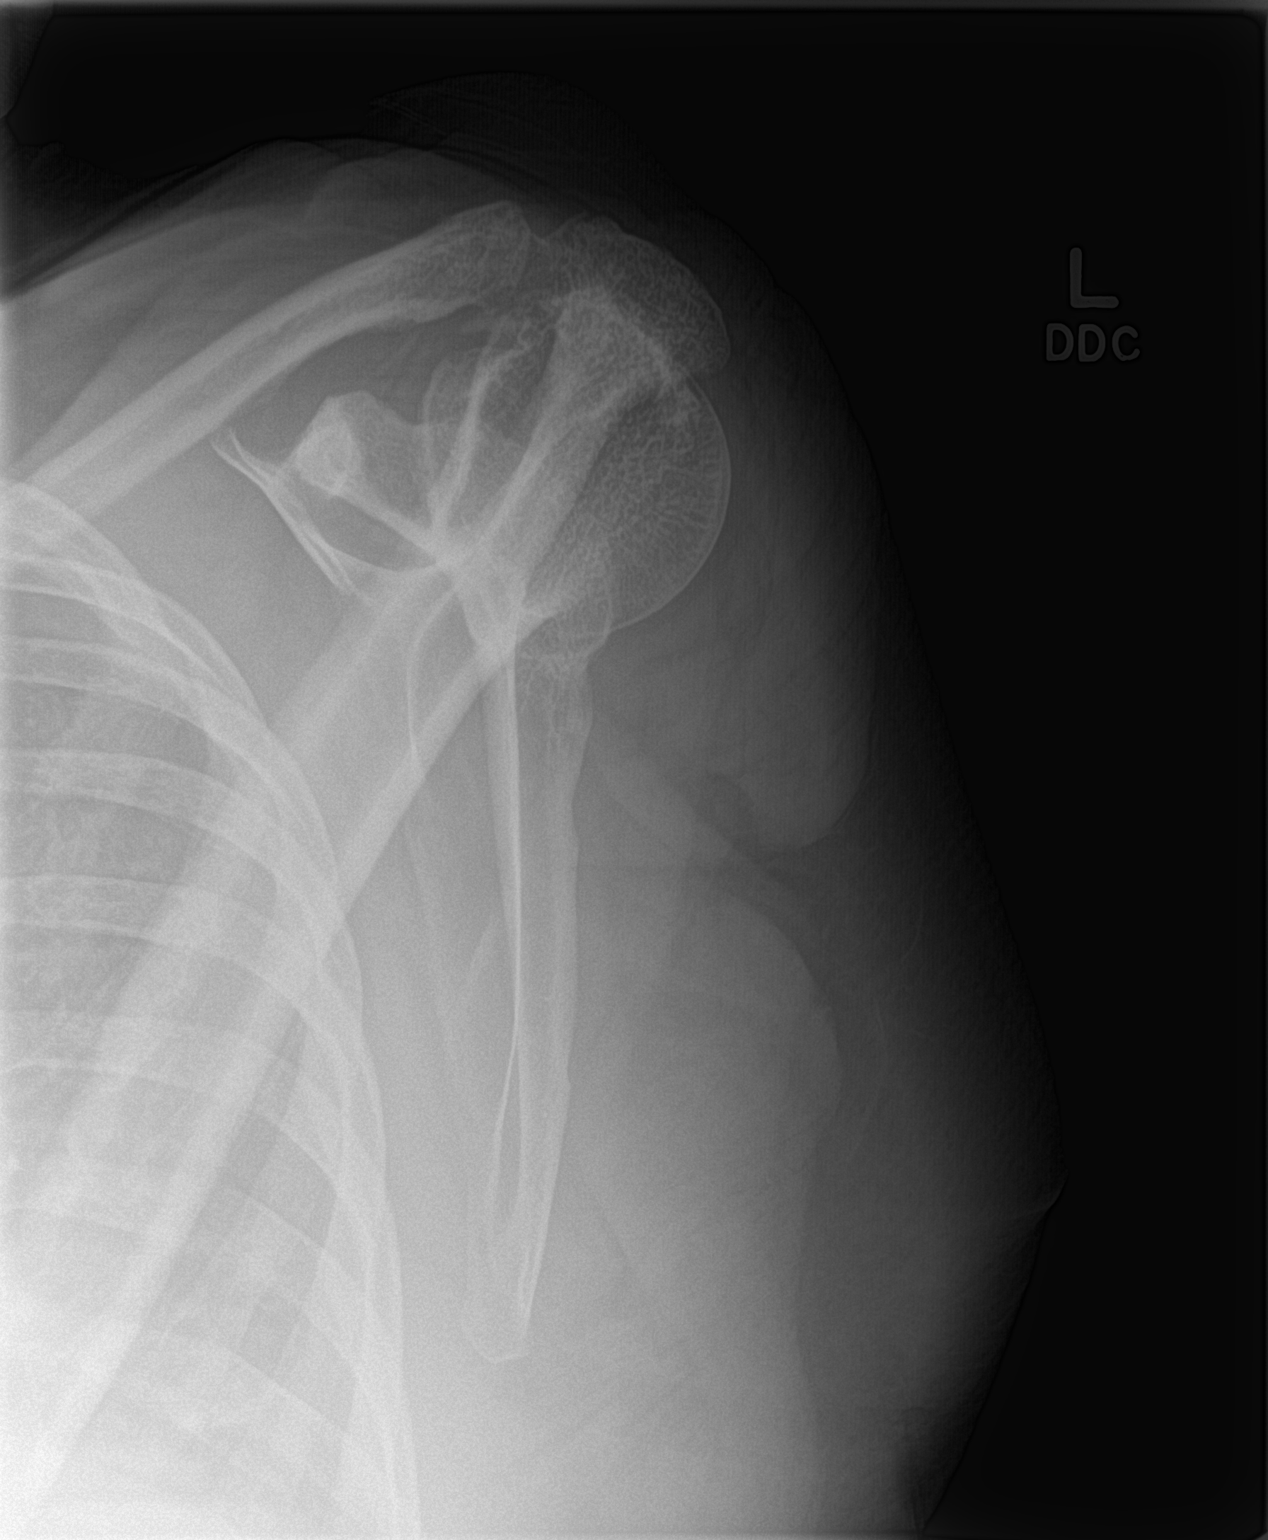
[im 3/3]
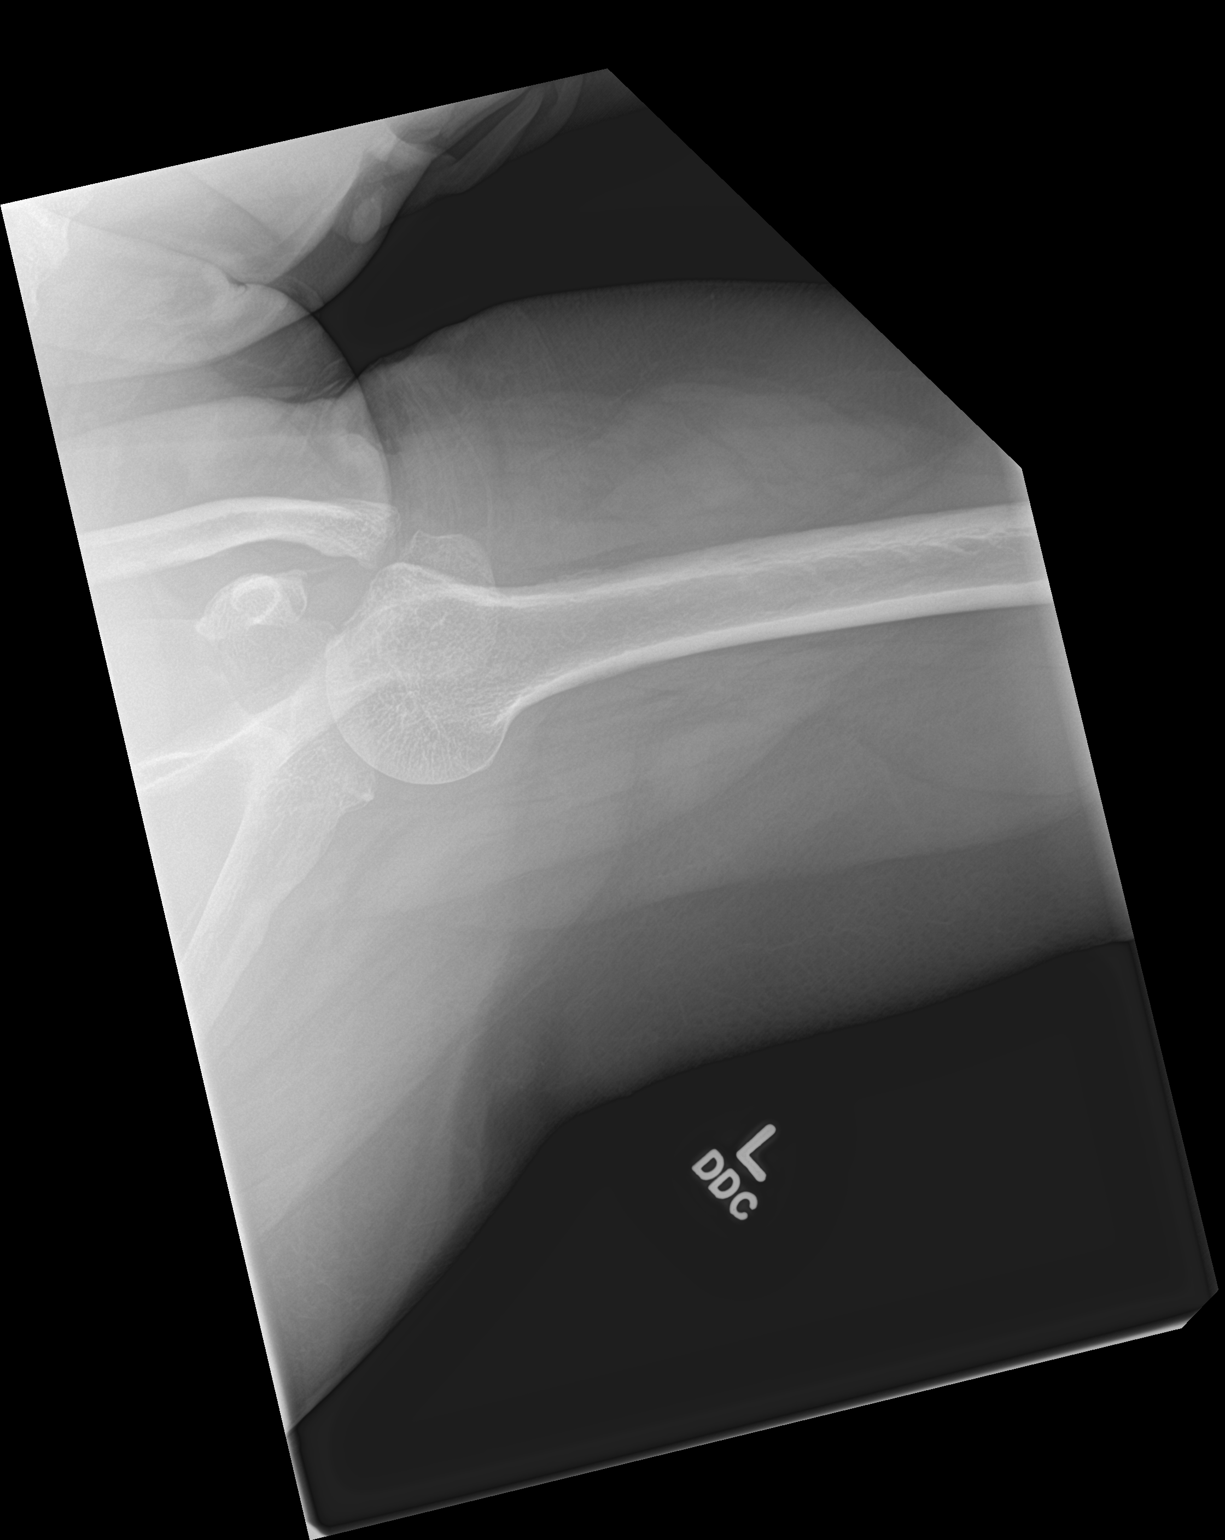

[3 of 3 positions shown; findings below may reference images not displayed]

FINDINGS: No acute bony or joint abnormality identified. No evidence of
fracture or dislocation. Mild acromioclavicular and glenohumeral
degenerative change.
IMPRESSION: No acute or focal abnormality. Mild degenerative changes left
shoulder .

## 2016-11-28 IMAGING — CR DG SHOULDER 2+V*R*
1 series · 3 of 3 positions shown · non-contrast
Comparison: 01/05/2016.

CLINICAL DATA: Chronic pain.  No recent prior.

EXAM:
RIGHT SHOULDER - 2+ VIEW

[Series 1: dg shoulder right · 0.14mm/px · 3 of 3 slices shown]
[im 1/3]
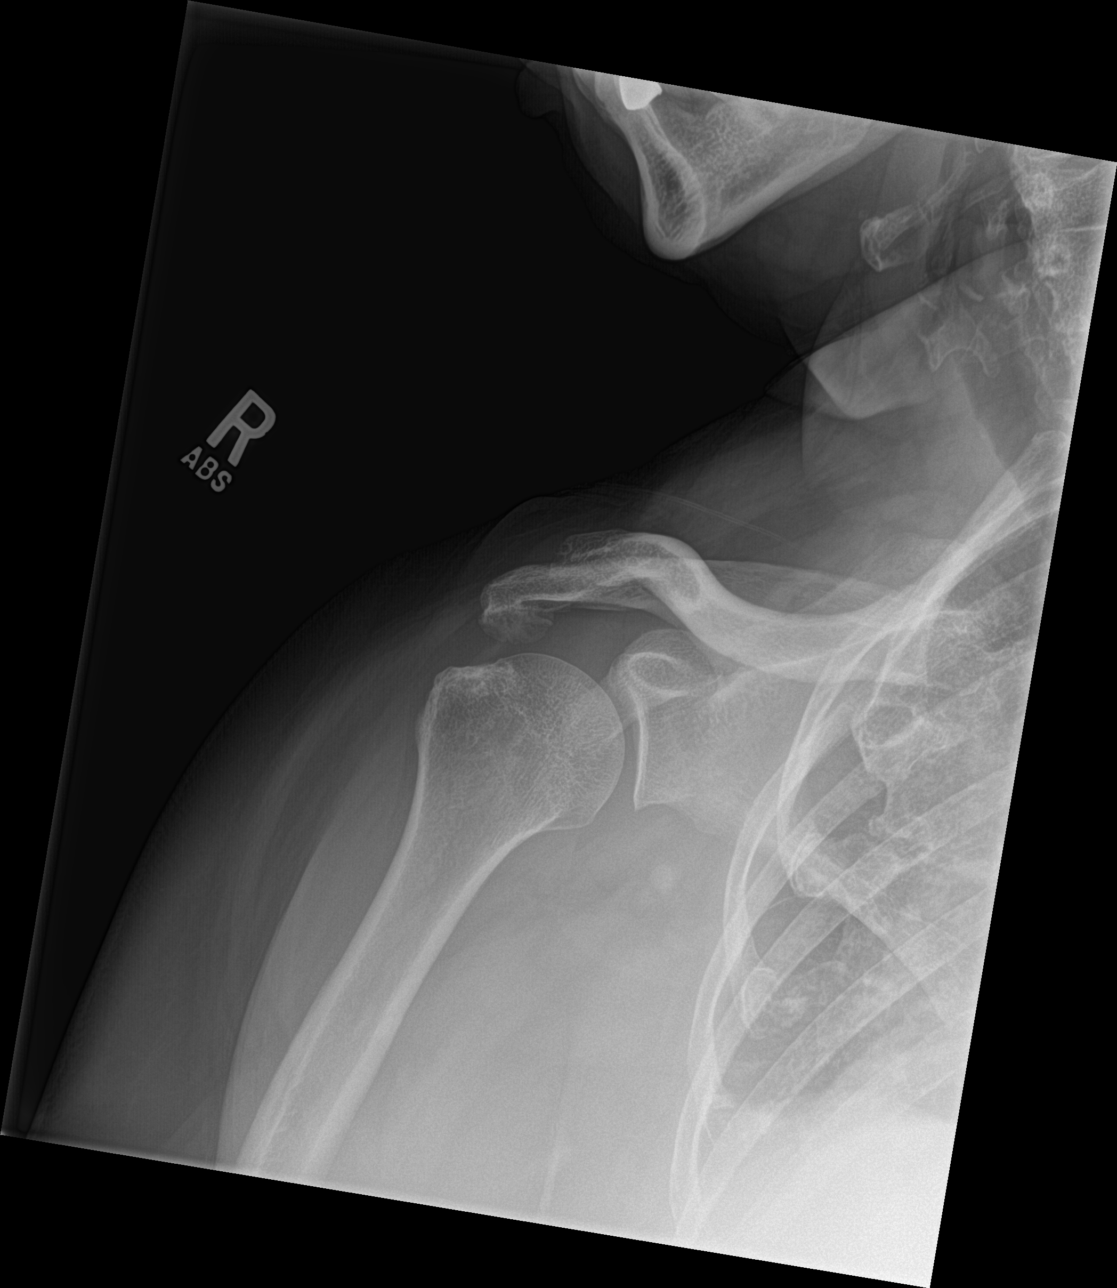
[im 2/3]
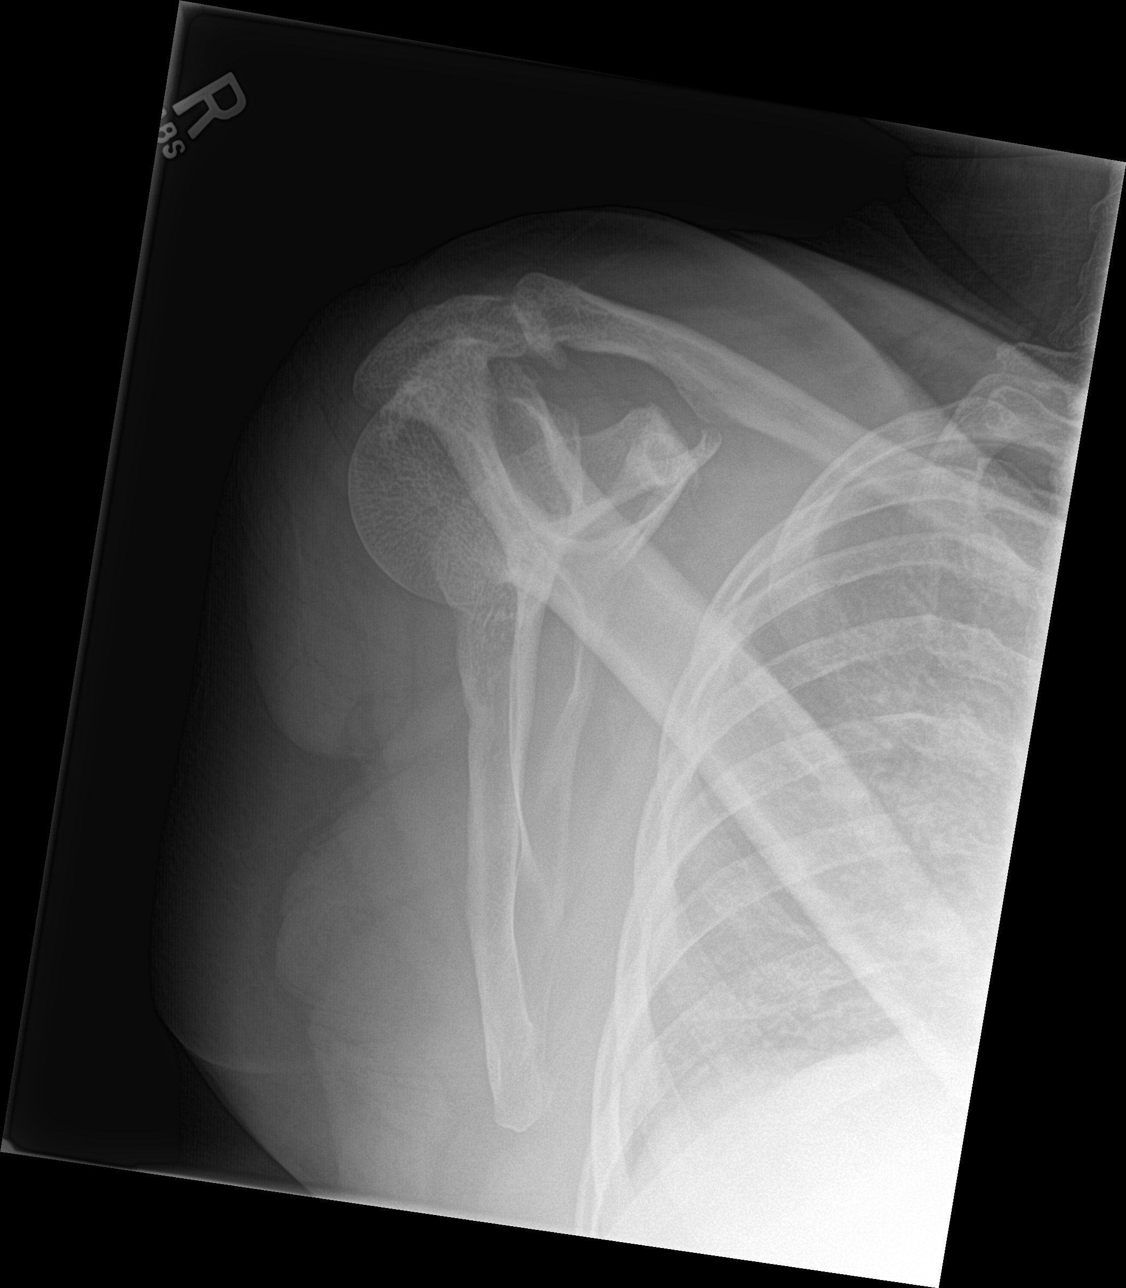
[im 3/3]
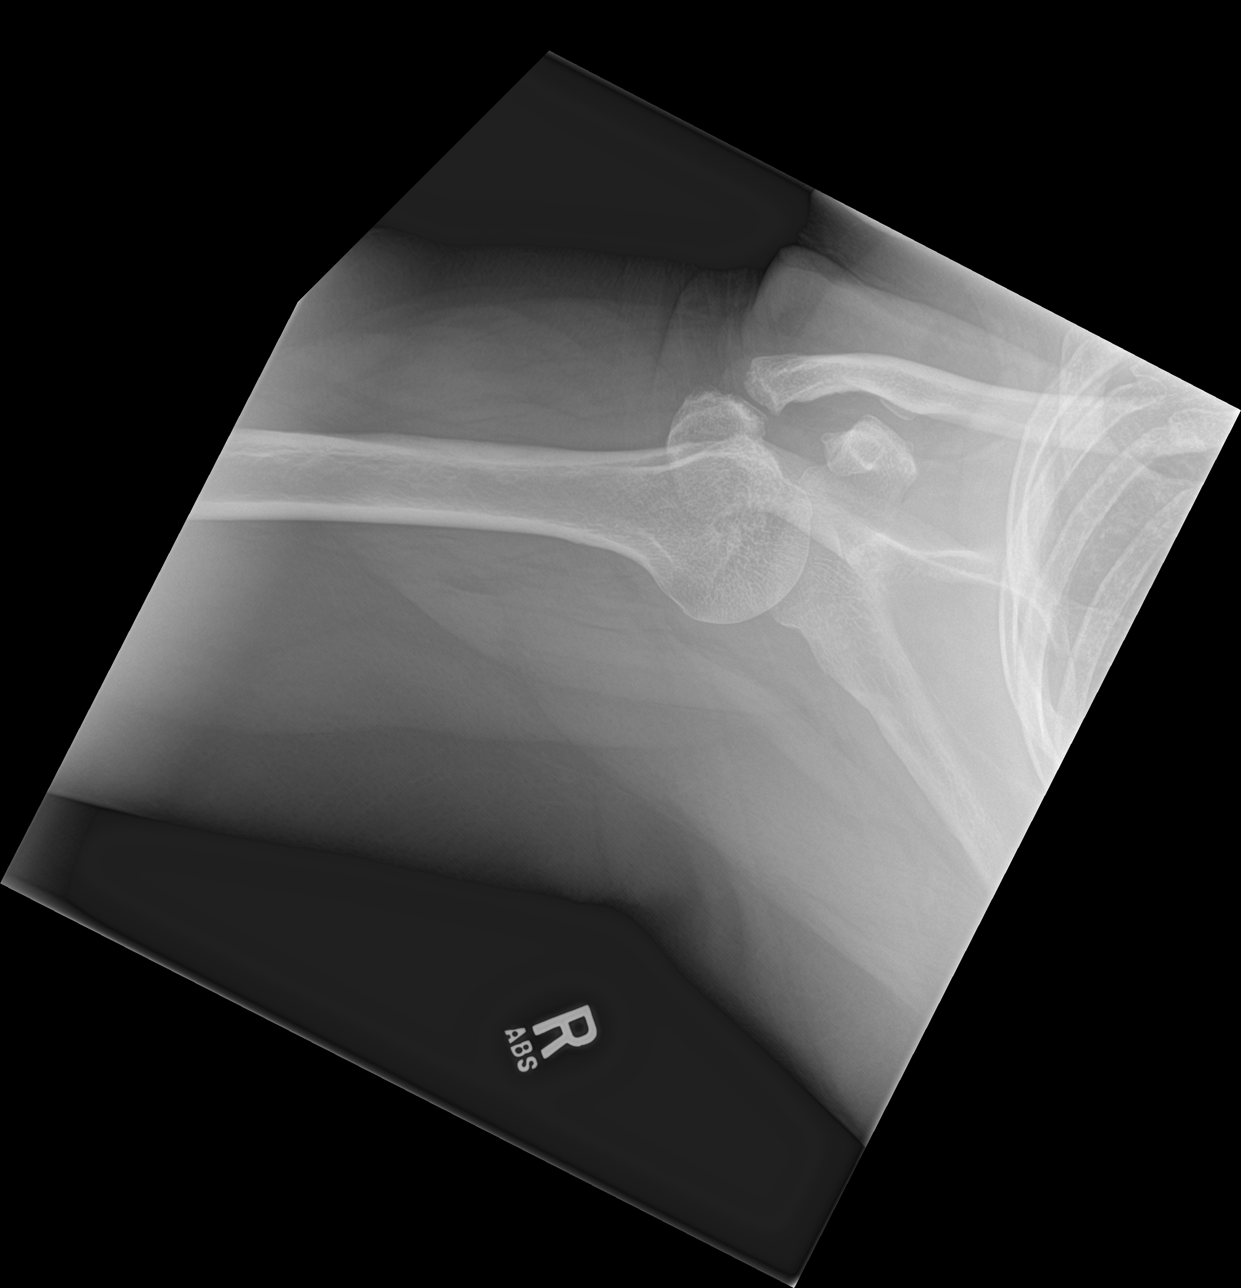

[3 of 3 positions shown; findings below may reference images not displayed]

FINDINGS: Acromioclavicular glenohumeral degenerative change. Subacromial
spurring is noted. No evidence of fracture or dislocation.
IMPRESSION: 1.  No acute abnormality.

2. Degenerative changes right shoulder with subacromial spurring.

## 2016-11-28 IMAGING — CR DG CERVICAL SPINE COMPLETE 4+V
1 series · 7 of 7 positions shown · non-contrast
Comparison: None.

CLINICAL DATA: Chronic neck pain, no known injury, initial
encounter

EXAM:
CERVICAL SPINE - COMPLETE 4+ VIEW

[Series 1: dg cervical spine complete · 0.14mm/px · 7 of 7 slices shown]
[im 1/7]
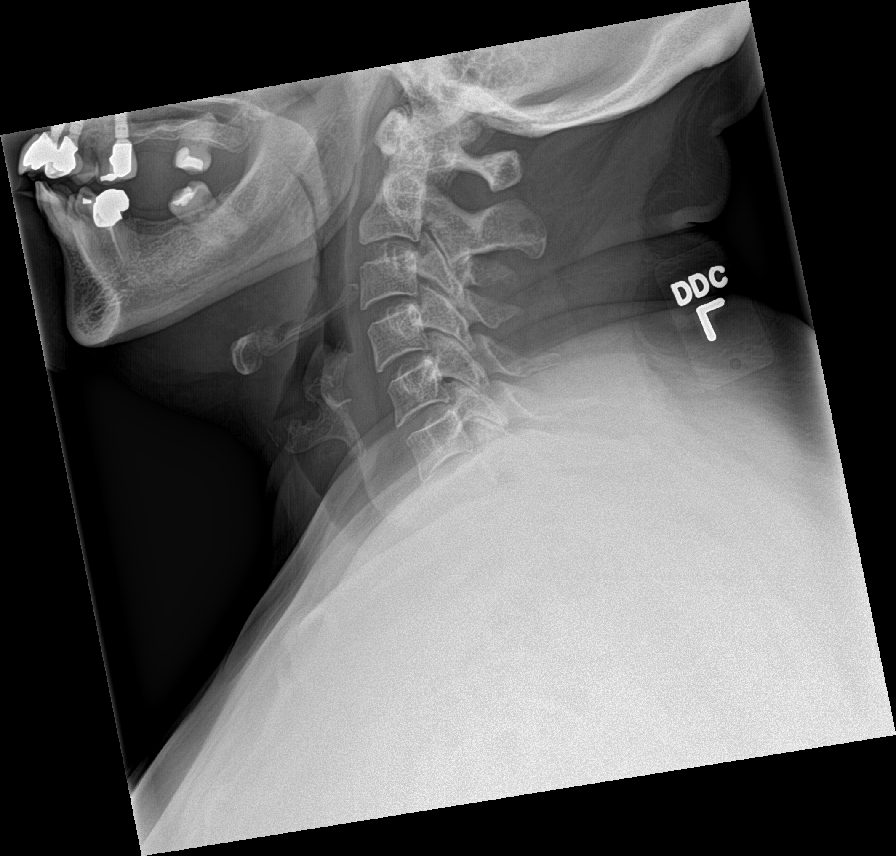
[im 2/7]
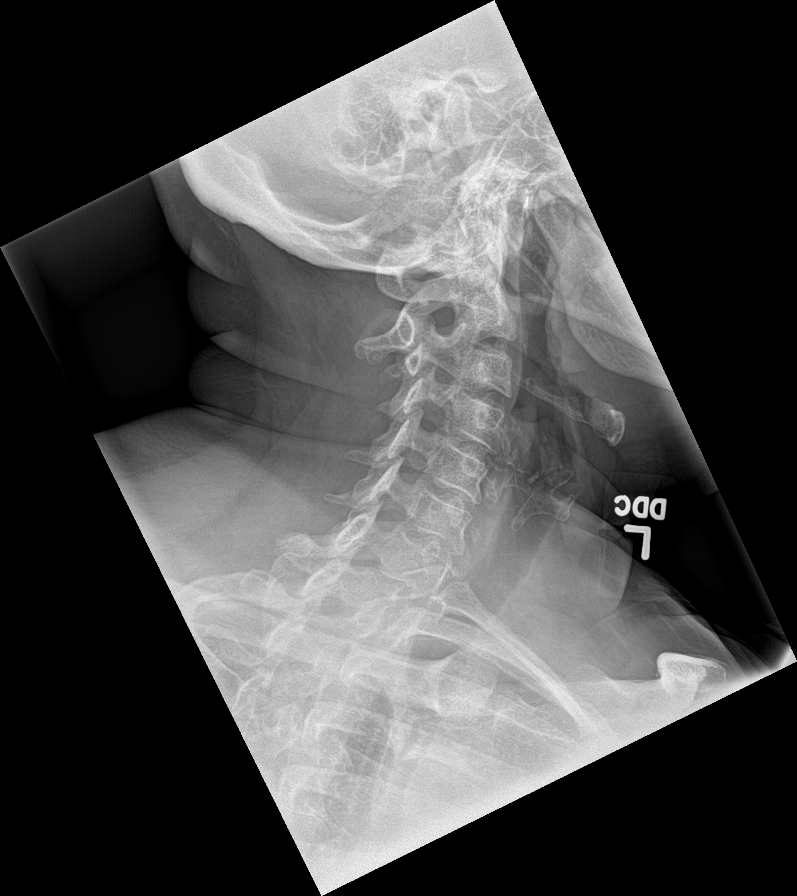
[im 3/7]
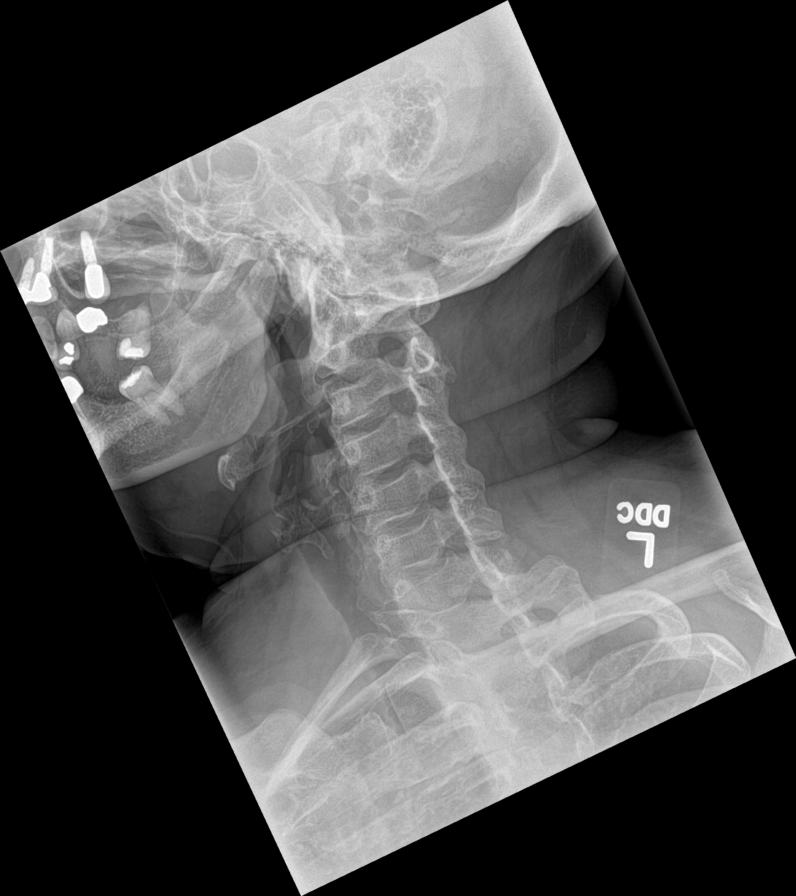
[im 4/7]
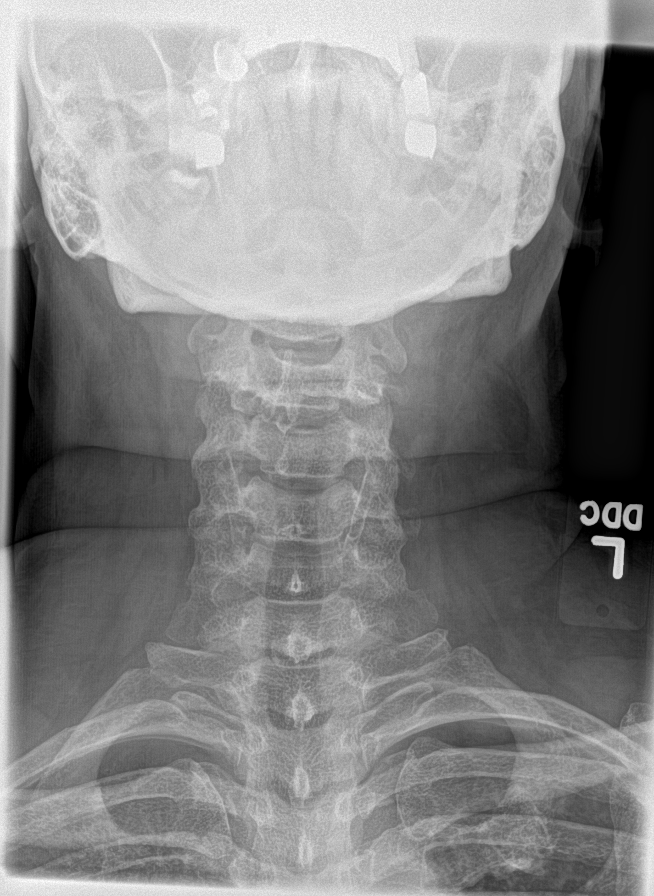
[im 5/7]
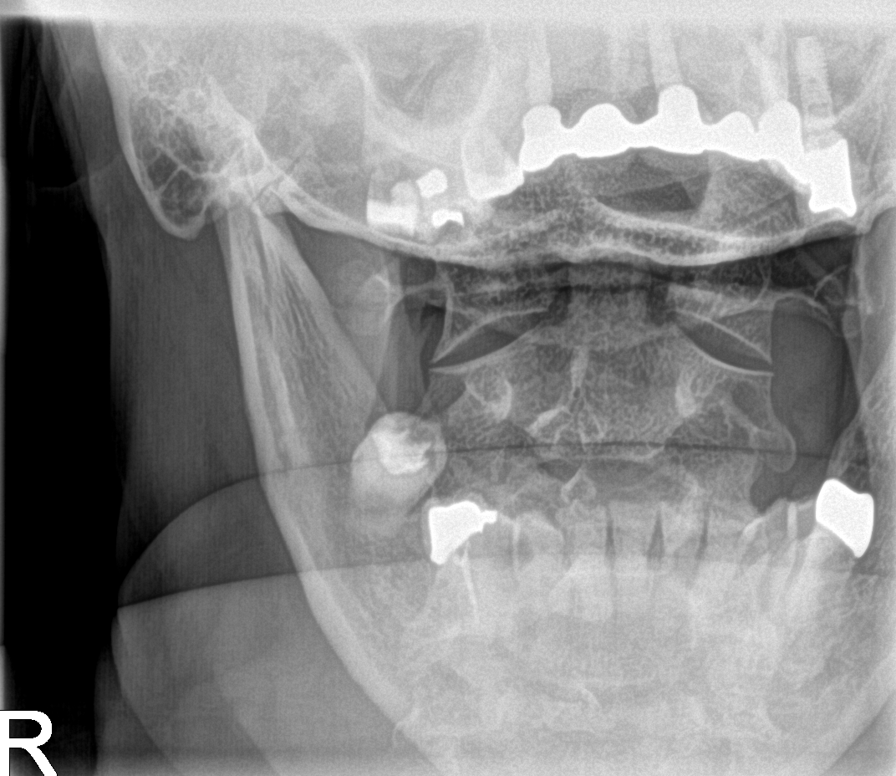
[im 6/7]
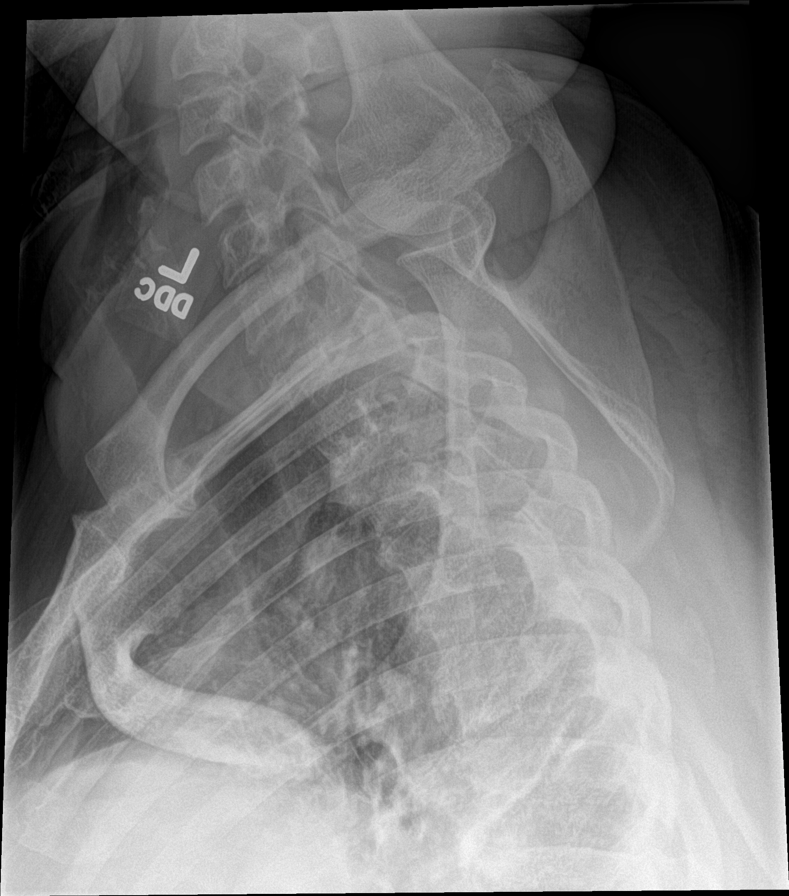
[im 7/7]
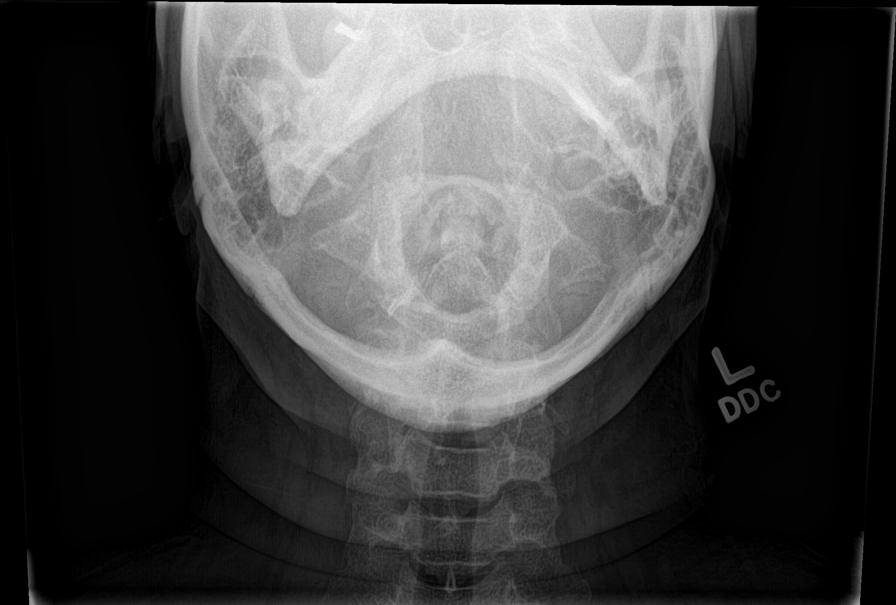

[7 of 7 positions shown; findings below may reference images not displayed]

FINDINGS: Seven cervical segments are well visualized. Vertebral body height
is well maintained. No prevertebral soft tissue changes are seen.
Mild facet hypertrophic changes are noted. No significant neural
foraminal narrowing is seen. The odontoid is within normal limits.
IMPRESSION: Mild degenerative change without acute abnormality.

## 2016-11-28 IMAGING — CR DG SI JOINTS 3+V
1 series · 3 of 3 positions shown · non-contrast
Comparison: CT 06/01/2016.  MRI 02/08/2016

CLINICAL DATA: Chronic pain.  No reported injury .

EXAM:
BILATERAL SACROILIAC JOINTS - 3+ VIEW

[Series 1: dg si joints · 0.14mm/px · 3 of 3 slices shown]
[im 1/3]
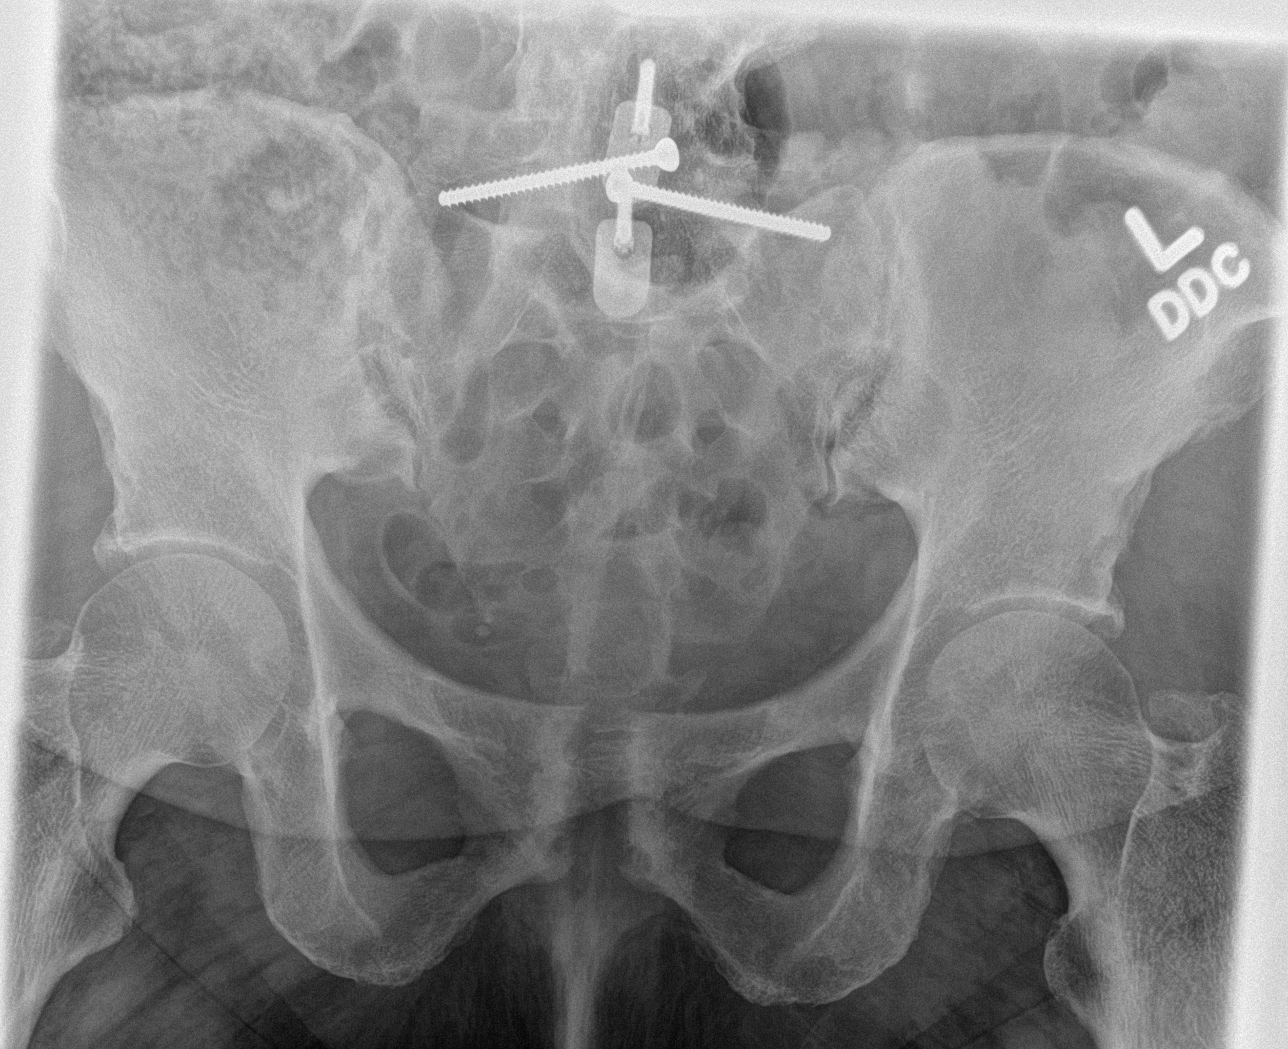
[im 2/3]
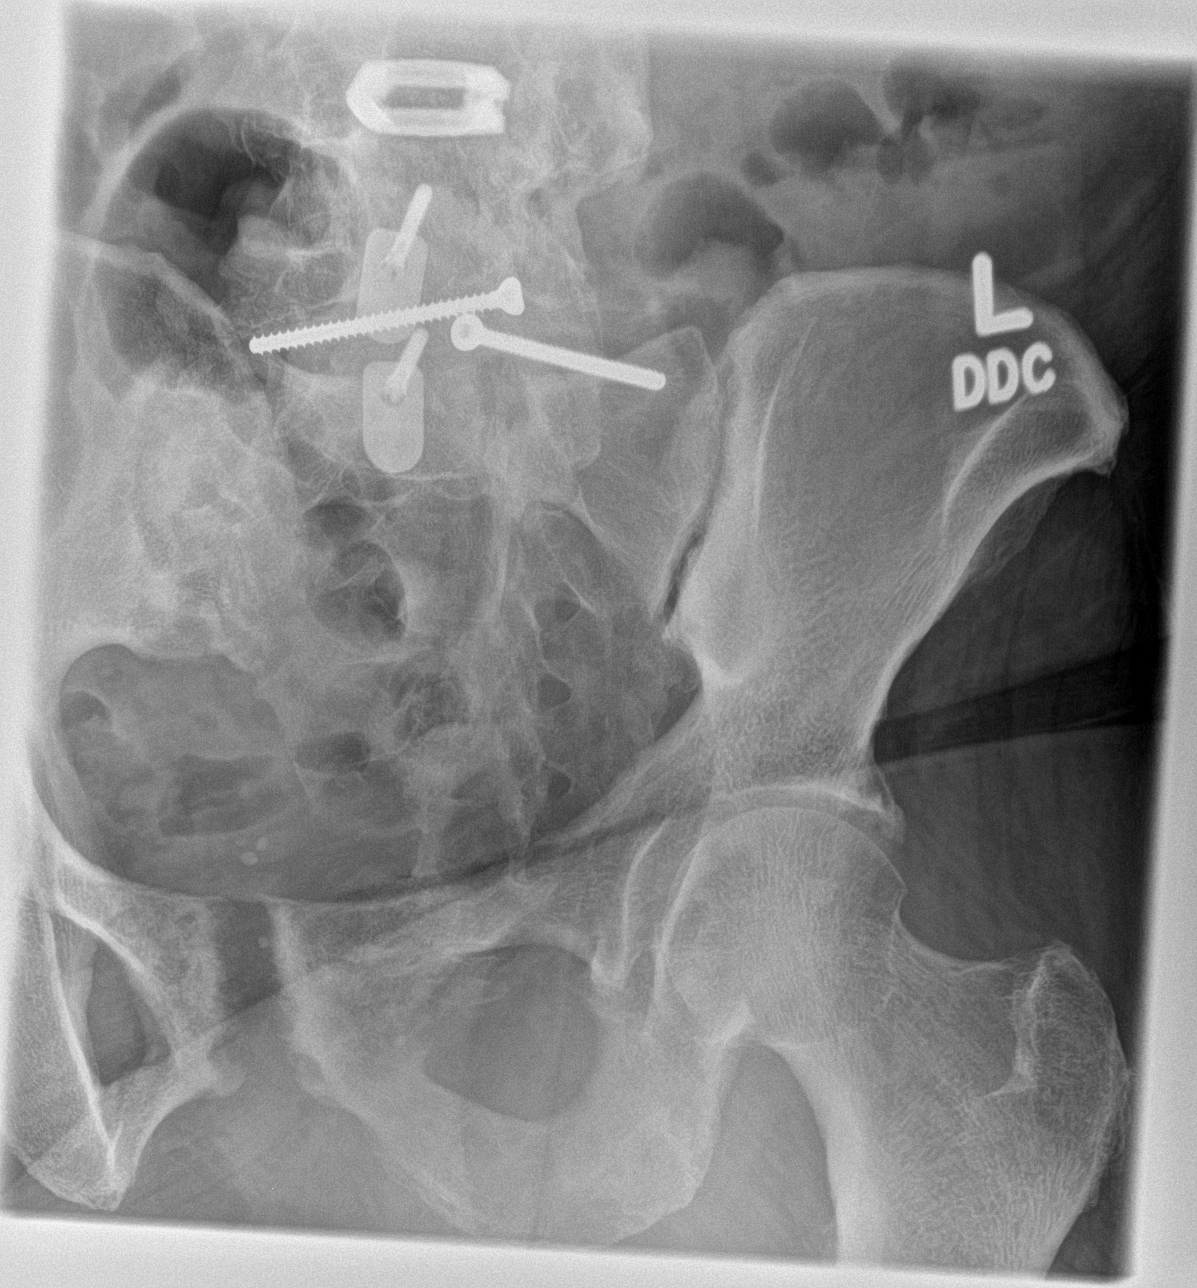
[im 3/3]
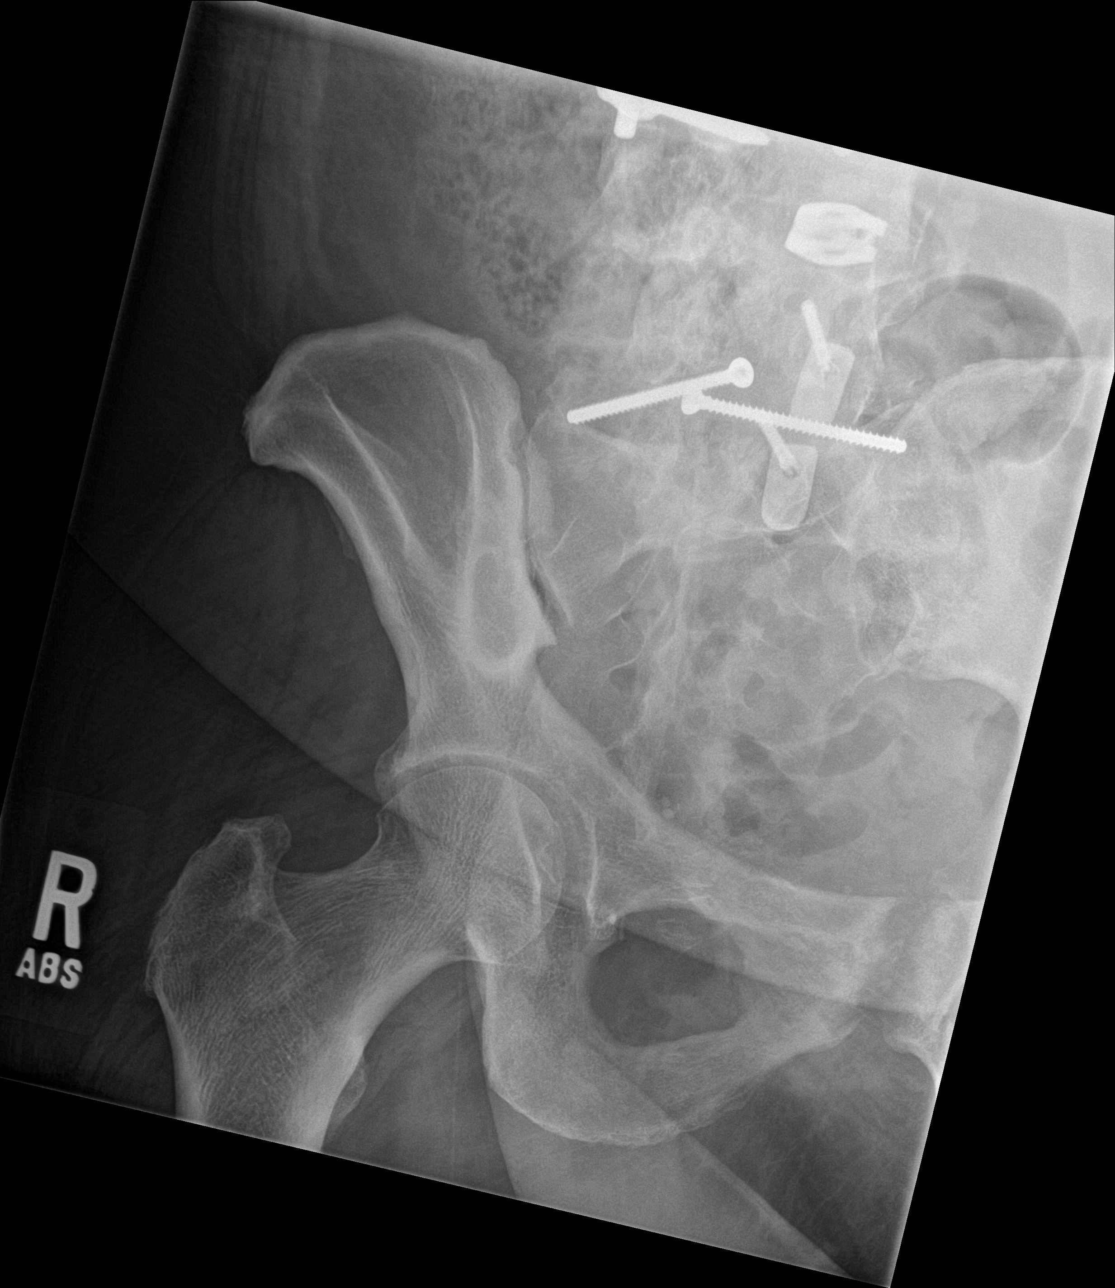

[3 of 3 positions shown; findings below may reference images not displayed]

FINDINGS: Lumbosacral spine fusion. Degenerative changes lumbar spine and both
SI joints. Degenerative changes both hips. Osteitis pubis again
noted. No acute abnormality identified. Pelvic calcifications
consistent phleboliths noted.
IMPRESSION: Lumbosacral spine fusion. Degenerative changes lumbar spine, both SI
joints, both hips. Osteitis pubis again noted. No acute abnormality
identified.

## 2016-11-28 IMAGING — CR DG HIP (WITH OR WITHOUT PELVIS) 2-3V*L*
1 series · 3 of 3 positions shown · non-contrast
Comparison: 05/22/2016.  MRI 02/08/2016.

CLINICAL DATA: Chronic pain.

EXAM:
DG HIP (WITH OR WITHOUT PELVIS) 2-3V LEFT

[Series 1: dg hip unilat w or w/o pelvis 2-3 views  · non-contrast · 0.14mm/px · 3 of 3 slices shown]
[im 1/3]
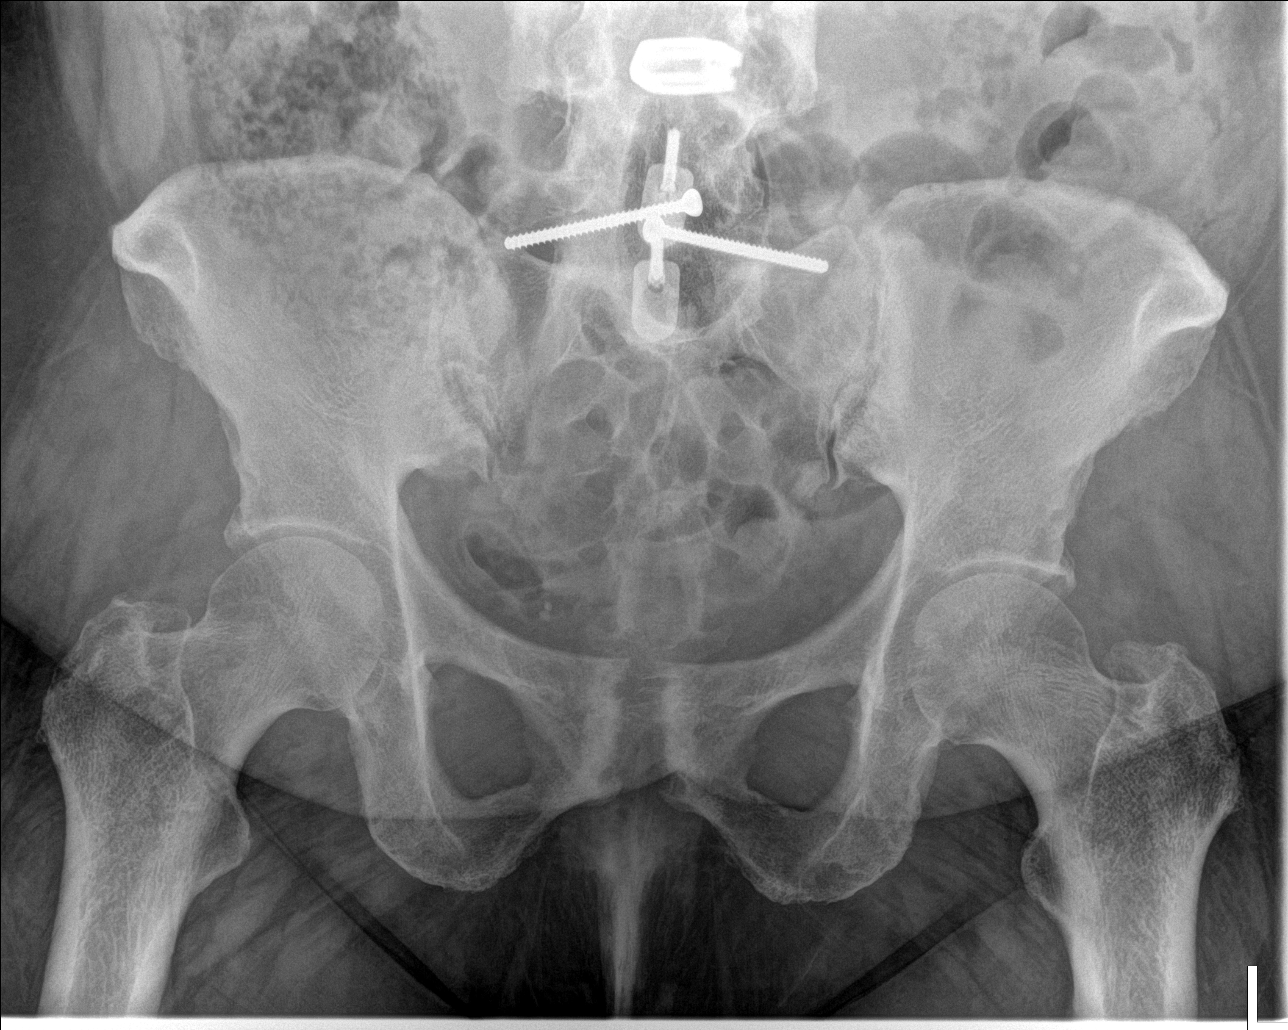
[im 2/3]
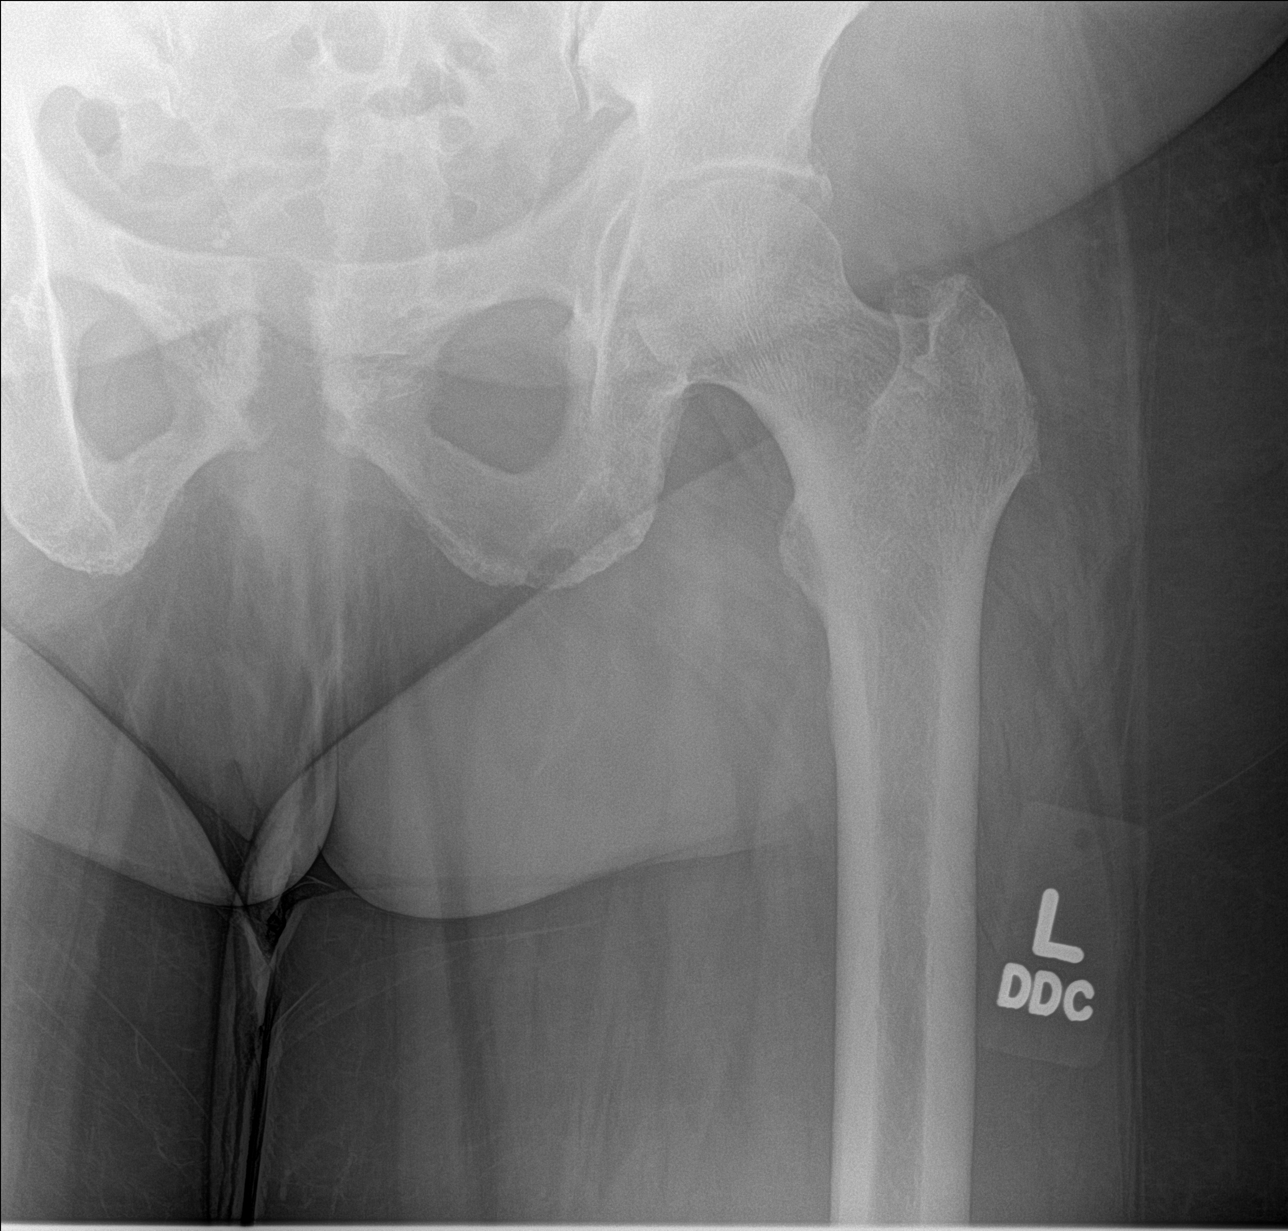
[im 3/3]
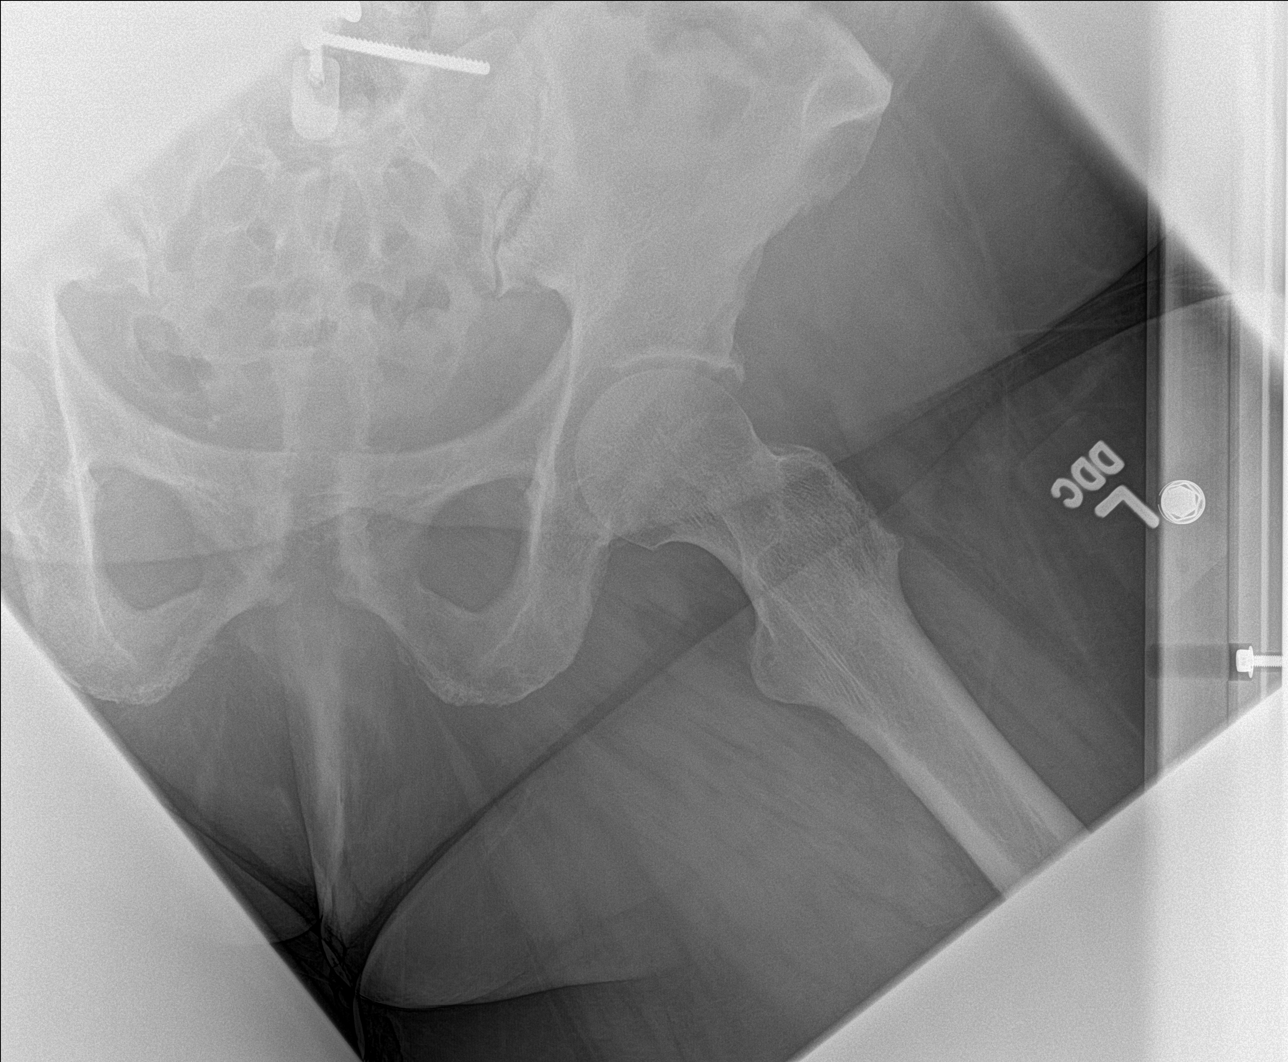

[3 of 3 positions shown; findings below may reference images not displayed]

FINDINGS: Prior lumbosacral spine fusion. Degenerative changes lumbar spine,
both SI joints, both hips. Osteitis pubis again noted.
IMPRESSION: No acute abnormality. Prior lumbosacral spine fusion. Degenerative
changes both SI joints and hips. Osteitis pubis again noted.

## 2016-11-30 DIAGNOSIS — T814XXD Infection following a procedure, subsequent encounter: Secondary | ICD-10-CM | POA: Diagnosis not present

## 2016-11-30 DIAGNOSIS — M6281 Muscle weakness (generalized): Secondary | ICD-10-CM | POA: Diagnosis not present

## 2016-11-30 DIAGNOSIS — T8131XD Disruption of external operation (surgical) wound, not elsewhere classified, subsequent encounter: Secondary | ICD-10-CM | POA: Diagnosis not present

## 2016-11-30 DIAGNOSIS — M170 Bilateral primary osteoarthritis of knee: Secondary | ICD-10-CM | POA: Diagnosis not present

## 2016-11-30 DIAGNOSIS — R2689 Other abnormalities of gait and mobility: Secondary | ICD-10-CM | POA: Diagnosis not present

## 2016-11-30 DIAGNOSIS — M160 Bilateral primary osteoarthritis of hip: Secondary | ICD-10-CM | POA: Diagnosis not present

## 2016-12-01 DIAGNOSIS — M160 Bilateral primary osteoarthritis of hip: Secondary | ICD-10-CM | POA: Diagnosis not present

## 2016-12-01 DIAGNOSIS — R2689 Other abnormalities of gait and mobility: Secondary | ICD-10-CM | POA: Diagnosis not present

## 2016-12-01 DIAGNOSIS — T814XXD Infection following a procedure, subsequent encounter: Secondary | ICD-10-CM | POA: Diagnosis not present

## 2016-12-01 DIAGNOSIS — T8131XD Disruption of external operation (surgical) wound, not elsewhere classified, subsequent encounter: Secondary | ICD-10-CM | POA: Diagnosis not present

## 2016-12-01 DIAGNOSIS — M6281 Muscle weakness (generalized): Secondary | ICD-10-CM | POA: Diagnosis not present

## 2016-12-01 DIAGNOSIS — M170 Bilateral primary osteoarthritis of knee: Secondary | ICD-10-CM | POA: Diagnosis not present

## 2016-12-03 IMAGING — CT CT ABD-PELV W/ CM
2 of 5 series · 16 of 46 positions shown, 18 images · IV contrast (Omni 300)
Comparison: 01/30/2016

CLINICAL DATA: Epigastric abdominal pain for 5 days. Vomiting
starting last night. Prior hysterectomy. Prior abdominal adhesion
removal.

EXAM:
CT ABDOMEN AND PELVIS WITH CONTRAST
TECHNIQUE: Multidetector CT imaging of the abdomen and pelvis was performed
using the standard protocol following bolus administration of
intravenous contrast.
CONTRAST:  100mL LKRGUT-0CC IOPAMIDOL (LKRGUT-0CC) INJECTION 61%

[Series 2: a/p w/ 5mm · axial · 0.82mm/px · z∈[-446,-16]mm · 13 of 98 slices shown, 15 images]
[im 6/98  soft-tissue]
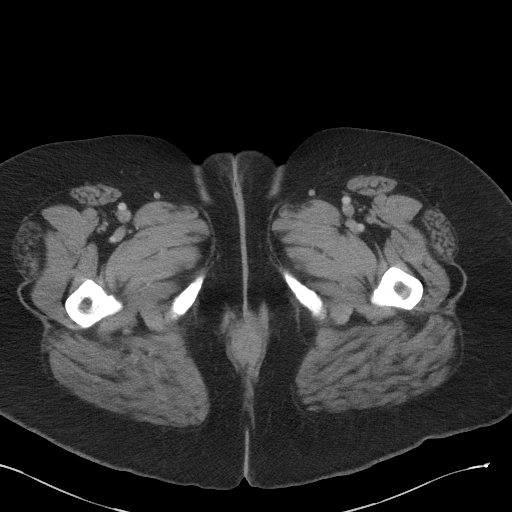
[im 6/98  bone]
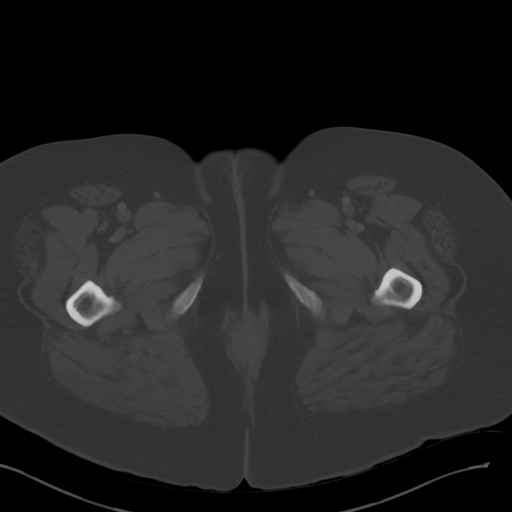
[im 16/98  soft-tissue]
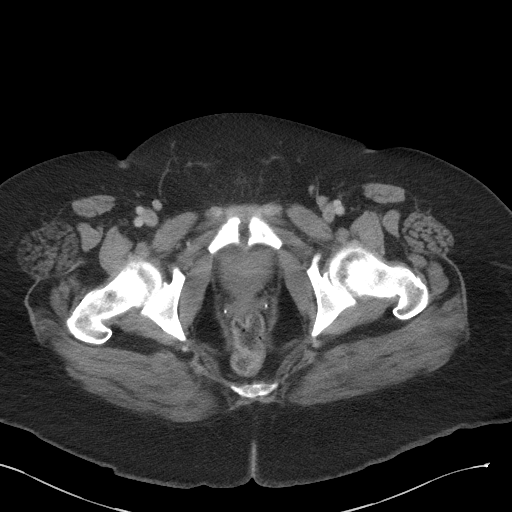
[im 21/98  soft-tissue]
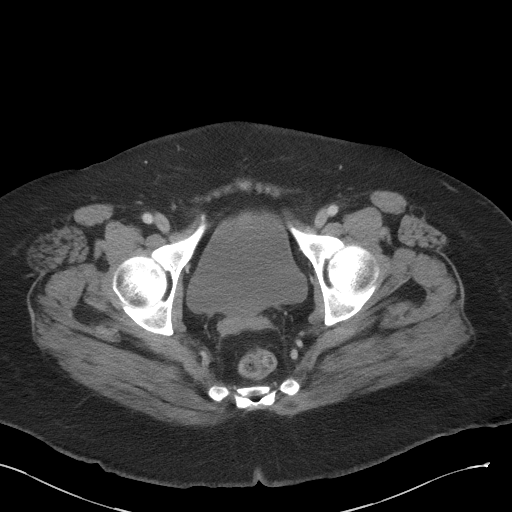
[im 26/98  soft-tissue]
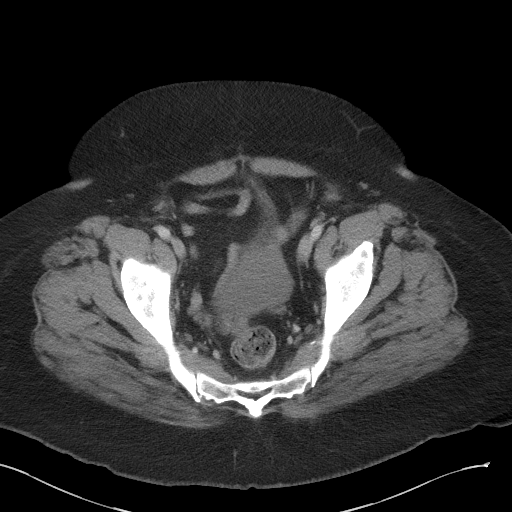
[im 36/98  soft-tissue]
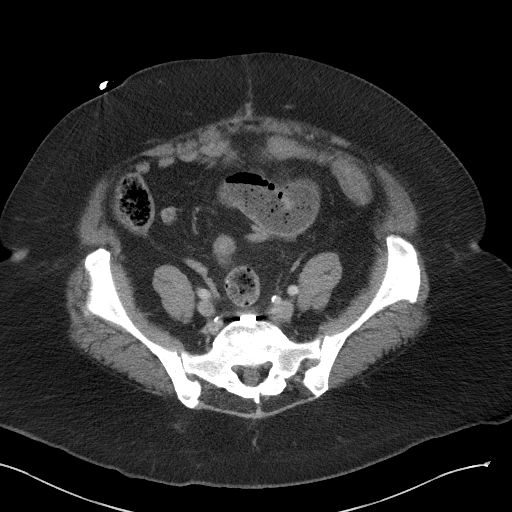
[im 41/98  soft-tissue]
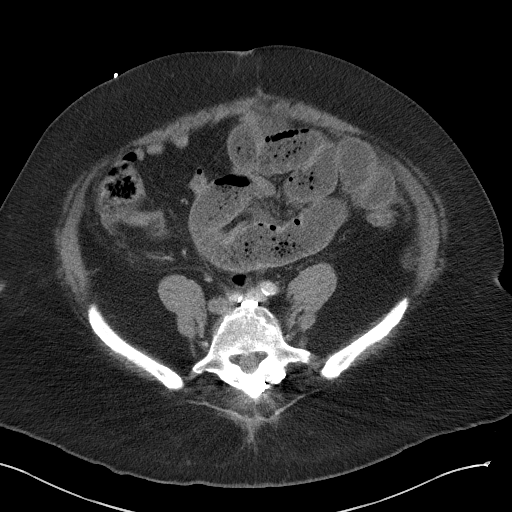
[im 52/98  soft-tissue]
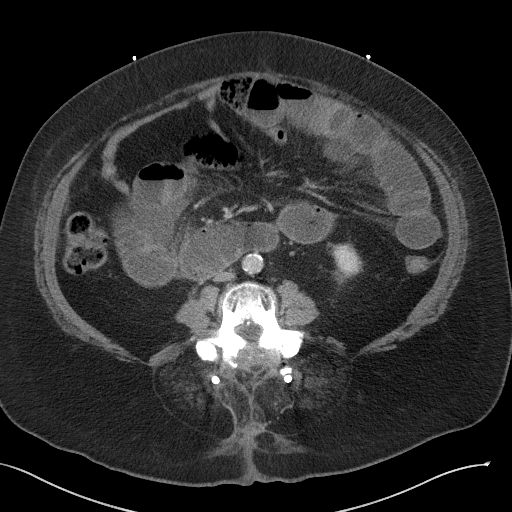
[im 57/98  soft-tissue]
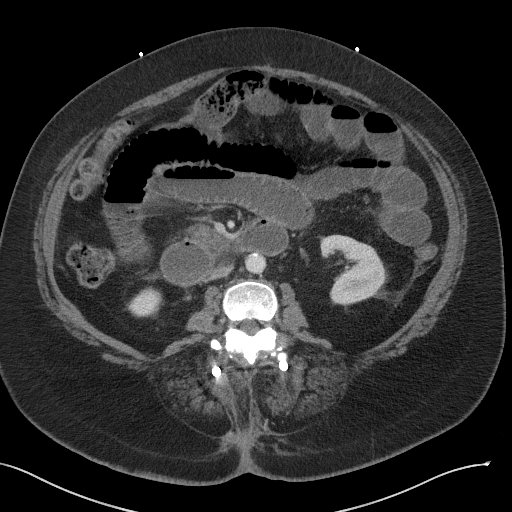
[im 62/98  soft-tissue]
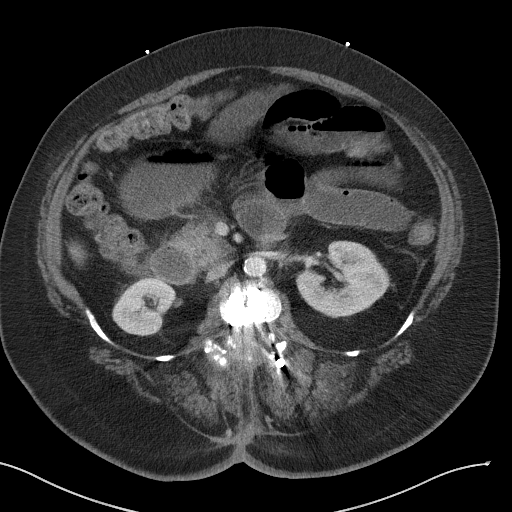
[im 62/98  bone]
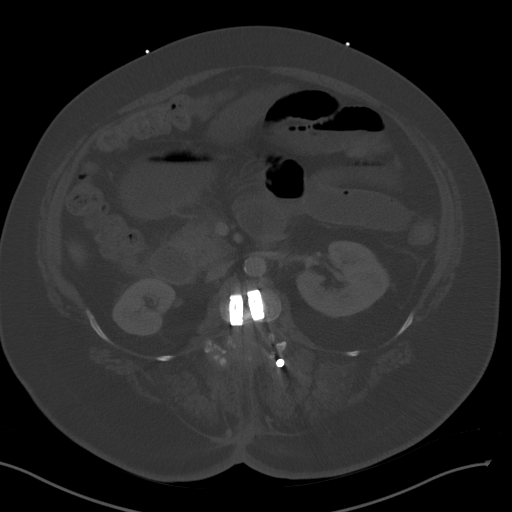
[im 72/98  soft-tissue]
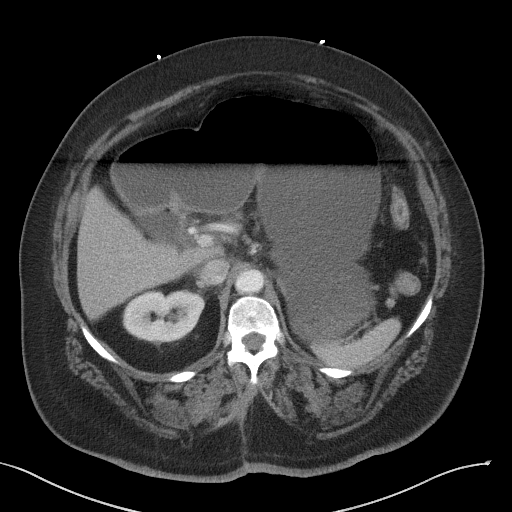
[im 77/98  soft-tissue]
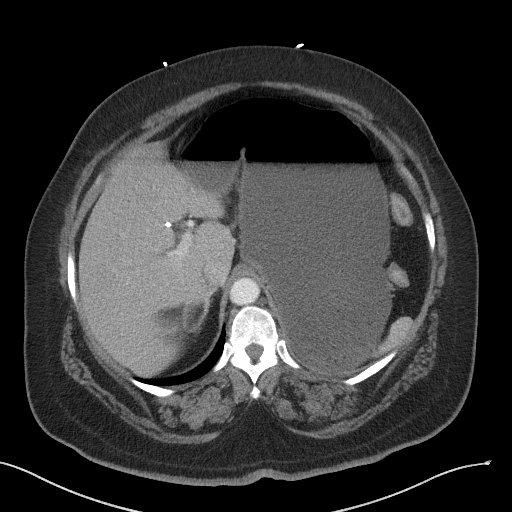
[im 82/98  soft-tissue]
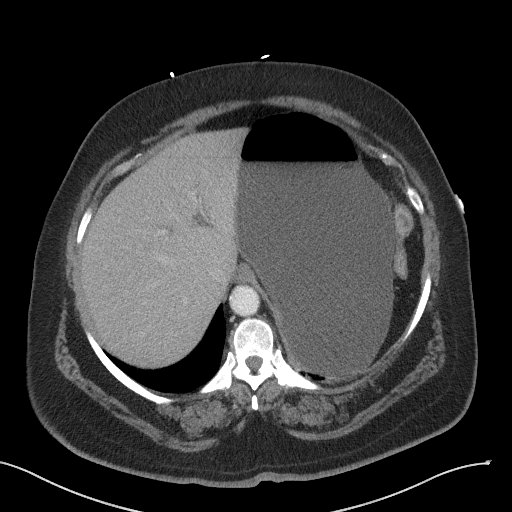
[im 92/98  soft-tissue]
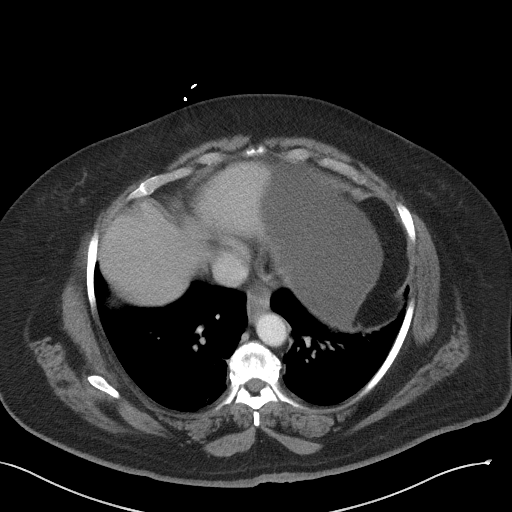

[Series 5: a/p w/ cor · coronal · 0.93mm/px · 3 of 187 slices shown]
[im 63/187  soft-tissue]
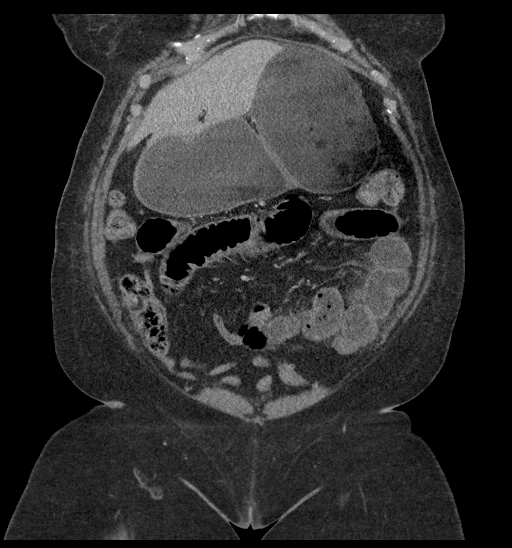
[im 83/187  soft-tissue]
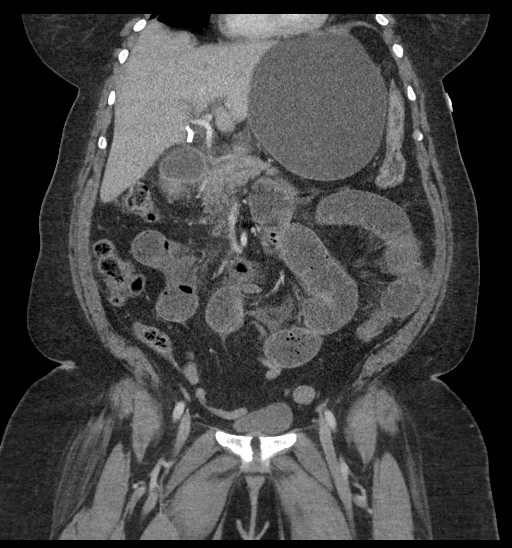
[im 104/187  soft-tissue]
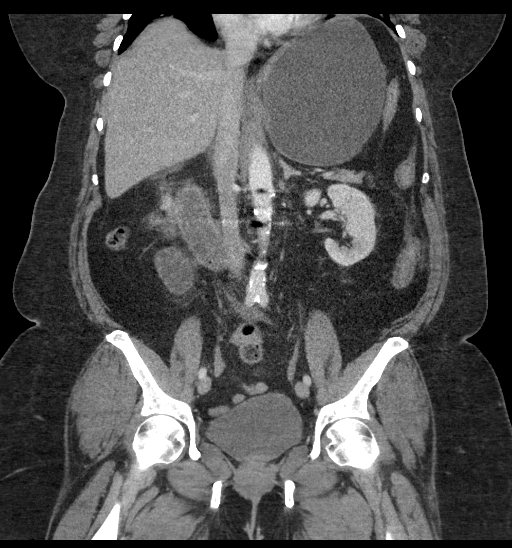

[16 of 46 positions shown; findings below may reference images not displayed]

FINDINGS: Lower chest: Bibasilar atelectasis. Normal heart size without
pericardial or pleural effusion. Fluid in the distal esophagus.

Hepatobiliary: Normal liver. Cholecystectomy, without biliary ductal
dilatation.

Pancreas: Normal, without mass or ductal dilatation.

Spleen: Normal in size, without focal abnormality.

Adrenals/Urinary Tract: Normal right adrenal gland. Left adrenal
nodule measures 1.7 cm and is been similar in back to 06/23/2015,
consistent with an adenoma on that study. Normal kidneys, without
hydronephrosis. Normal urinary bladder.

Stomach/Bowel: The stomach is distended and fluid-filled. The colon
is normal to minimally decompressed distally. Decompressed terminal
and distal ileum.

Proximal and mid small bowel loops are dilated, including at 3.7 cm
on image 56/series 2. Relatively gradual transition occurs,
including within left paracentral anteriorly positioned small bowel
loops on image 52/series 2. No obstructive mass. No findings to
suggest complicating ischemia. Minimal nonspecific mesenteric
interloop edema is seen including on image 56/series 2.

Vascular/Lymphatic: Advanced aortic and branch vessel
atherosclerosis. No abdominopelvic adenopathy.

Reproductive: Hysterectomy.  No adnexal mass.

Other: No significant free fluid. No pneumatosis or free
intraperitoneal air.

Musculoskeletal: Lumbosacral spine fixation.
IMPRESSION: 1. Mid small bowel obstruction, likely secondary to adhesions. No
complicating ischemia.
2. Distended, fluid-filled stomach with fluid in the esophagus. The
patient would likely benefit from nasogastric tube placement.
3. Advanced aortic atherosclerosis.
4. Left adrenal adenoma.

## 2016-12-03 IMAGING — CR DG ABD PORTABLE 1V
1 series · 1 of 1 positions shown · non-contrast
Comparison: CT abdomen/ pelvis earlier this day

CLINICAL DATA: NG tube placement.

EXAM:
PORTABLE ABDOMEN - 1 VIEW

[AP]
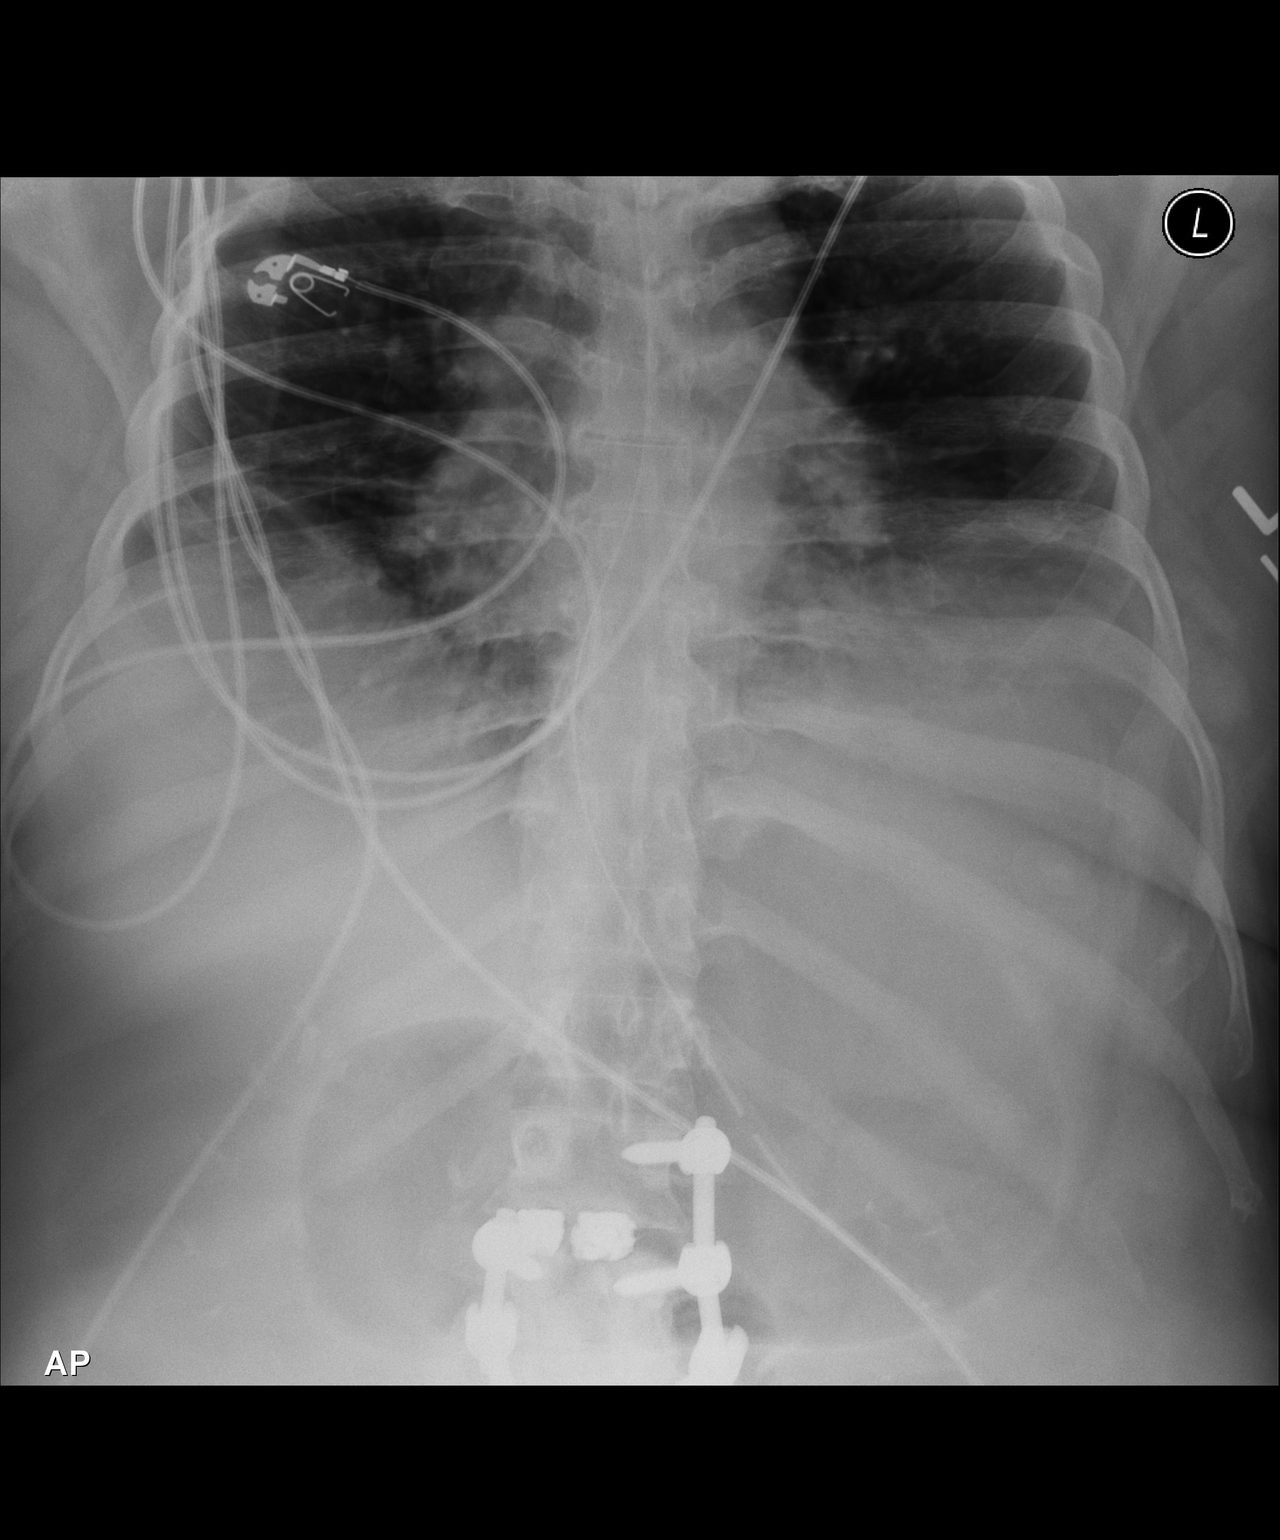

[1 of 1 positions shown; findings below may reference images not displayed]

FINDINGS: Tip and side port of the enteric tube below the diaphragm in the
stomach. Gaseous gastric distention. Small bowel dilatation is
partially included. Bibasilar atelectasis is seen.
IMPRESSION: Tip and side port of the enteric tube below the diaphragm in the
stomach.

## 2016-12-04 IMAGING — CR DG ABD PORTABLE 1V
1 series · 1 of 1 positions shown · non-contrast
Comparison: 07/02/2016 plain film and CT.

CLINICAL DATA: 70-year-old female with small bowel obstruction.
Subsequent encounter.

EXAM:
PORTABLE ABDOMEN - 1 VIEW

[AP]
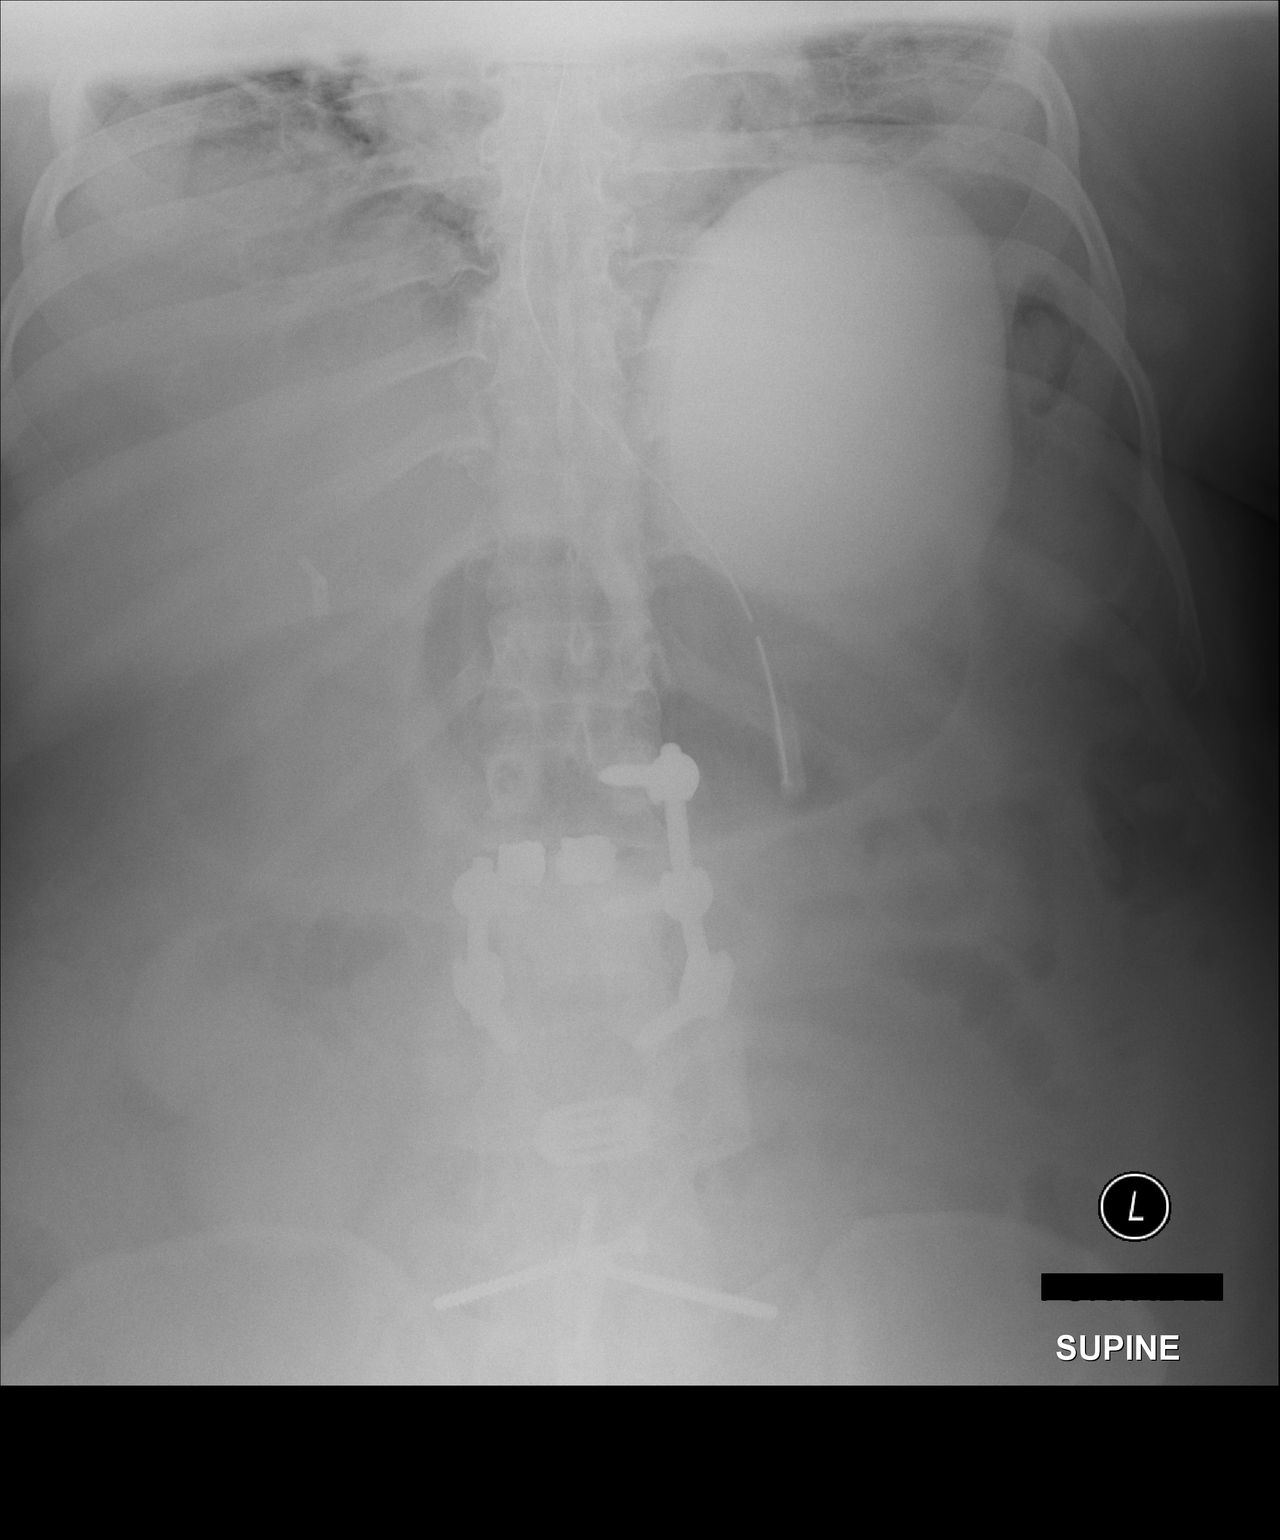

[1 of 1 positions shown; findings below may reference images not displayed]

FINDINGS: Nasogastric tube with tip gastric body level.

Contrast and gas-filled dilated stomach.

Gas-filled small bowel loops. The caliber of the small bowel loops
is not adequately assessed by plain film examination (as small bowel
loops may be more fluid-filled than gas-filled when compared to
prior exam) and small bowel obstructive pattern may still be
present.

The possibility of free intraperitoneal air cannot be assessed on a
supine view.

Postsurgical changes lumbar spine.
IMPRESSION: Contrast and gas-filled dilated stomach.

Gas-filled small bowel loops. The caliber of the small bowel loops
is not adequately assessed by plain film examination (as small bowel
loops may be more fluid-filled than gas-filled when compared to
prior exam) and small bowel obstructive pattern may still be
present.

## 2016-12-05 IMAGING — DX DG ABD PORTABLE 1V
1 series · 1 of 1 positions shown · non-contrast
Comparison: 07/03/2016

CLINICAL DATA: Delayed image for assessment of small bowel
obstruction.

EXAM:
PORTABLE ABDOMEN - 1 VIEW

[abdomen supine]
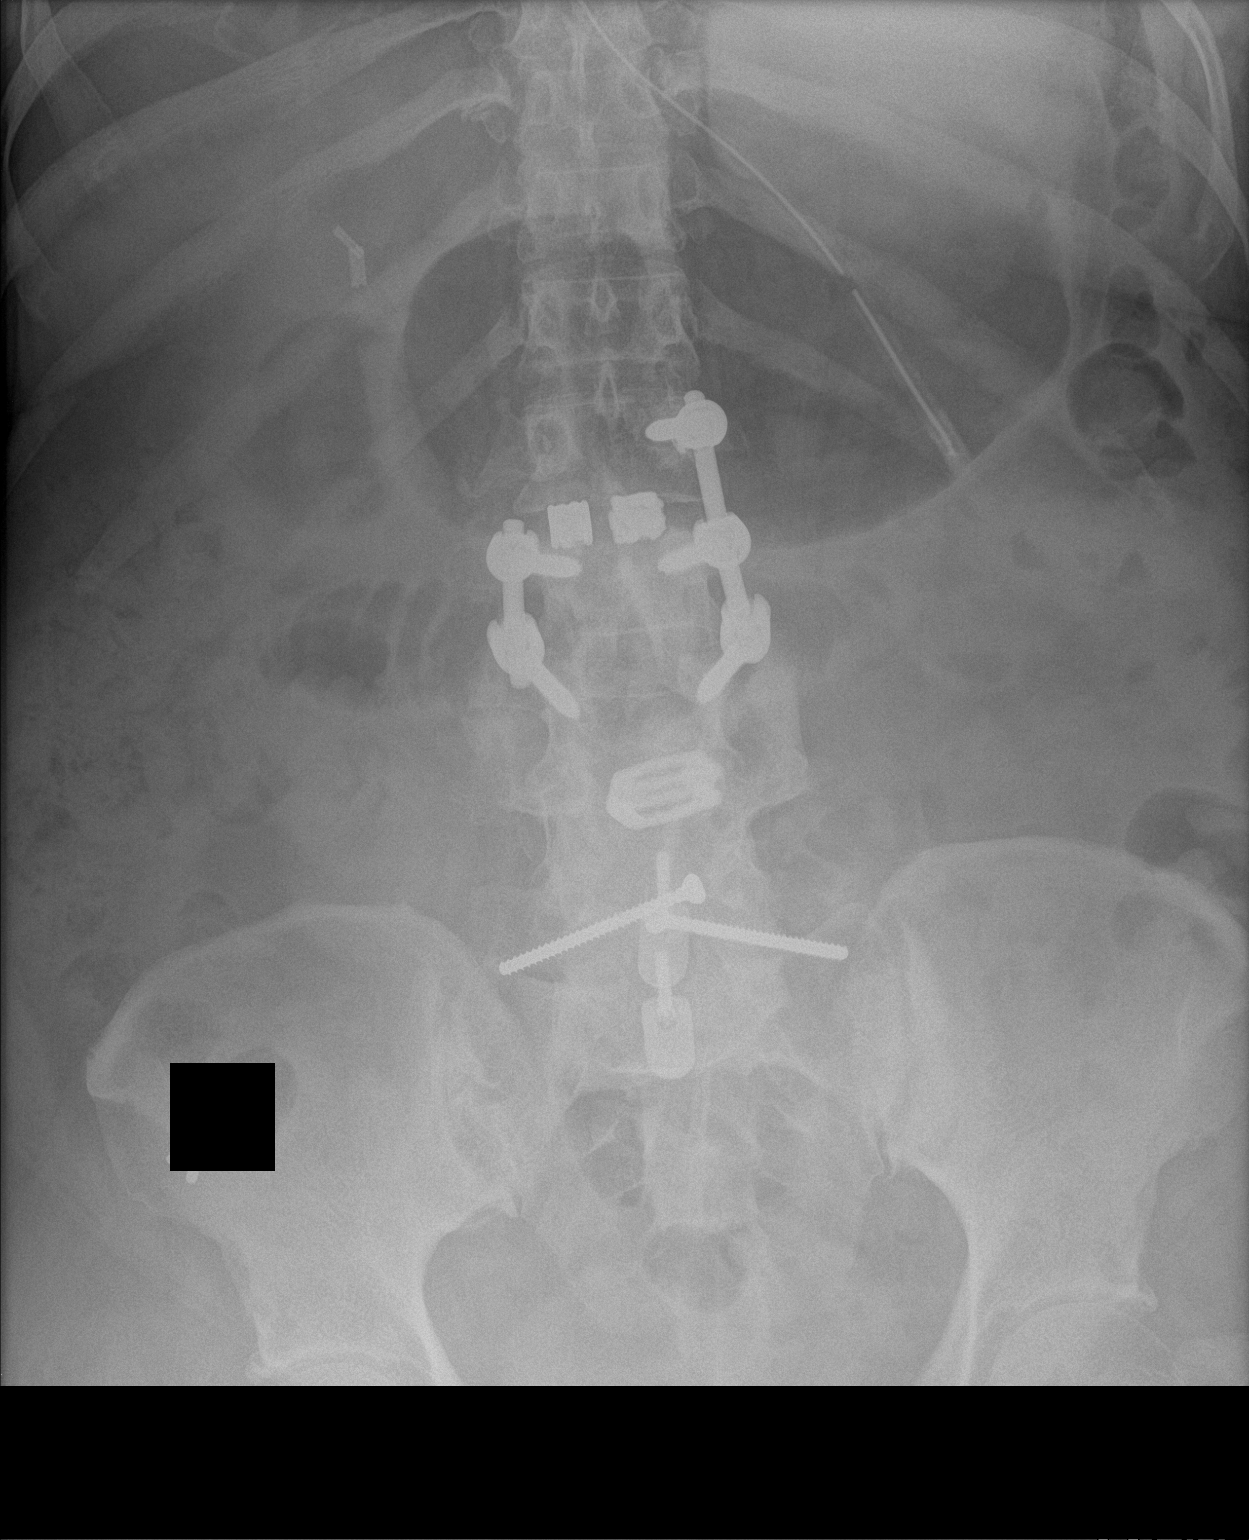

[1 of 1 positions shown; findings below may reference images not displayed]

FINDINGS: Contrast injected into the stomach under previous exam is no longer
visualized. It has passed. There is no evidence of small-bowel
obstruction.

Nasogastric tube is well positioned within the mid stomach.

Bowel gas pattern is unremarkable.
IMPRESSION: 1. No evidence of a small-bowel obstruction.

## 2016-12-06 IMAGING — DX DG ABD PORTABLE 1V
1 series · 1 of 1 positions shown · non-contrast
Comparison: 07/04/2016

CLINICAL DATA: Small-bowel obstruction.

EXAM:
PORTABLE ABDOMEN - 1 VIEW

[abdomen supine]
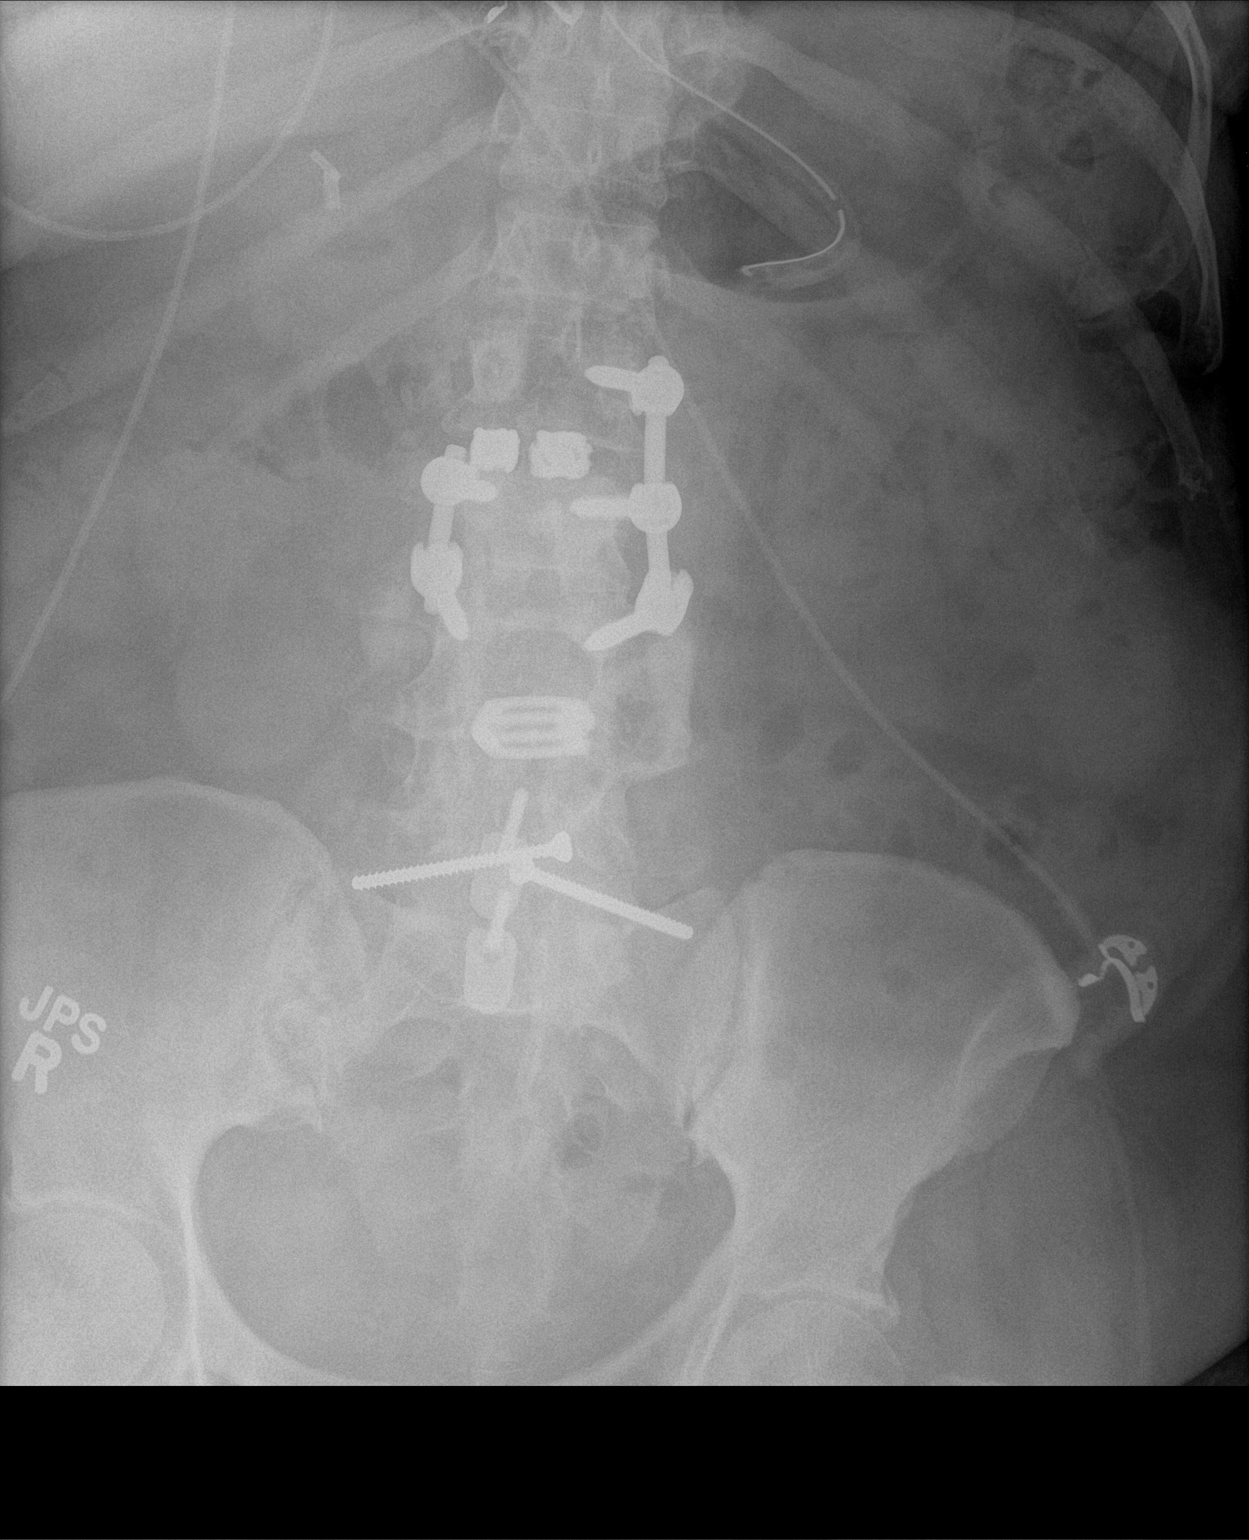

[1 of 1 positions shown; findings below may reference images not displayed]

FINDINGS: NG tube in the stomach. Stomach is decompressed. No dilated loops of
bowel identified. No bowel wall edema.

Lumbar fusion hardware unchanged.
IMPRESSION: Nonobstructive bowel gas pattern.  NG tube remains in the stomach.

## 2016-12-06 IMAGING — CT CT ABD-PELV W/ CM
2 of 5 series · 9 of 46 positions shown, 10 images · IV contrast (Iodine)
Comparison: 07/02/2016 and prior CTs.

CLINICAL DATA: 70-year-old female with recently diagnosed small
bowel obstruction and pancreatitis. Continued abdominal pelvic pain
with nausea.

EXAM:
CT ABDOMEN AND PELVIS WITH CONTRAST
TECHNIQUE: Multidetector CT imaging of the abdomen and pelvis was performed
using the standard protocol following bolus administration of
intravenous contrast.
CONTRAST:  100 cc intravenous Isovue 300

[Series 201: routine, idose (2) · axial · 0.84mm/px · z∈[+155,+545]mm · 6 of 103 slices shown, 7 images]
[im 13/103  soft-tissue]
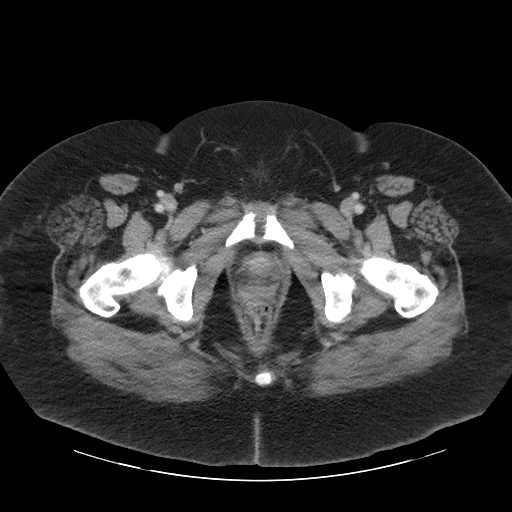
[im 13/103  bone]
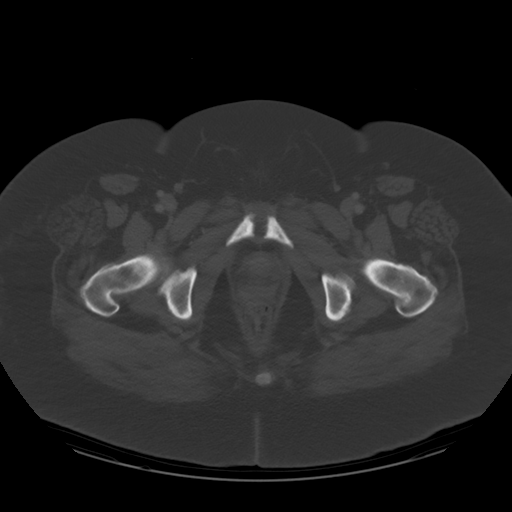
[im 31/103  soft-tissue]
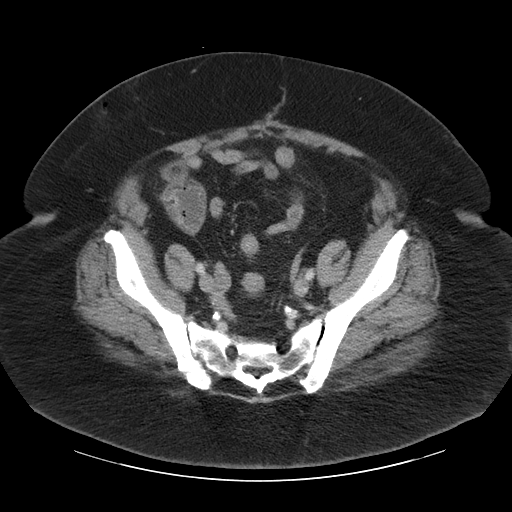
[im 43/103  soft-tissue]
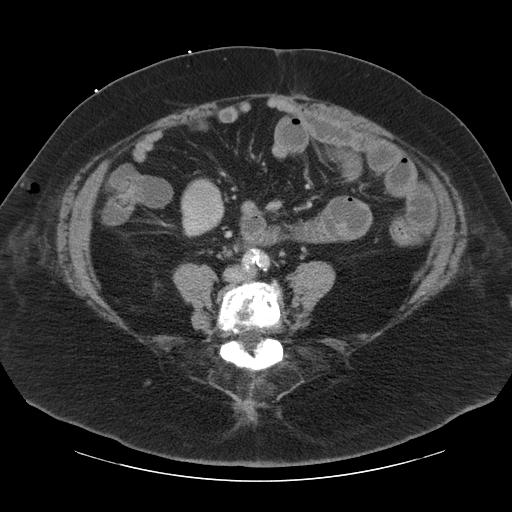
[im 61/103  soft-tissue]
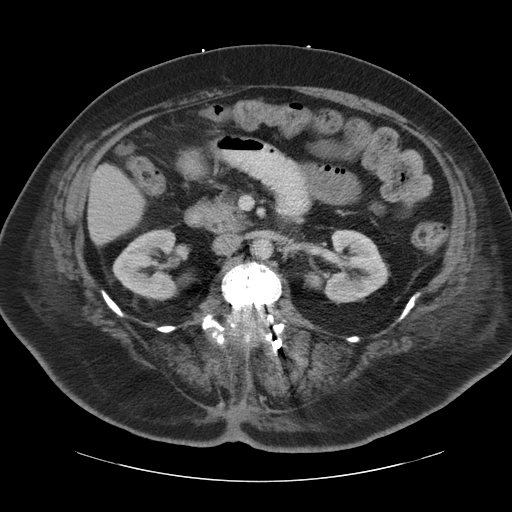
[im 79/103  soft-tissue]
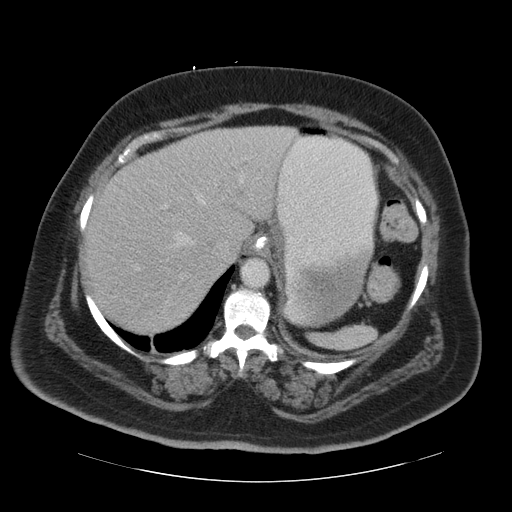
[im 91/103  soft-tissue]
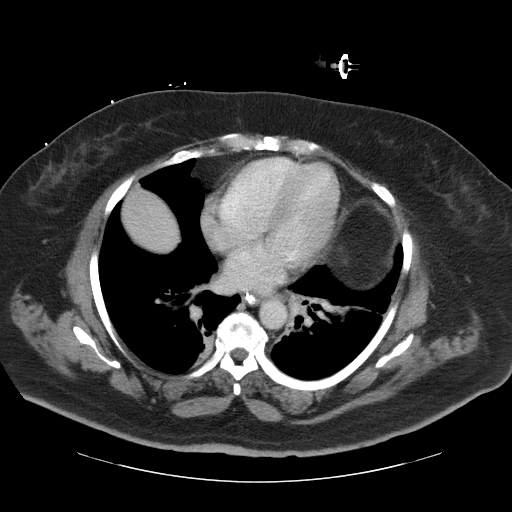

[Series 203: coronals, idose (2) · coronal · 0.45mm/px · 3 of 152 slices shown]
[im 51/152  soft-tissue]
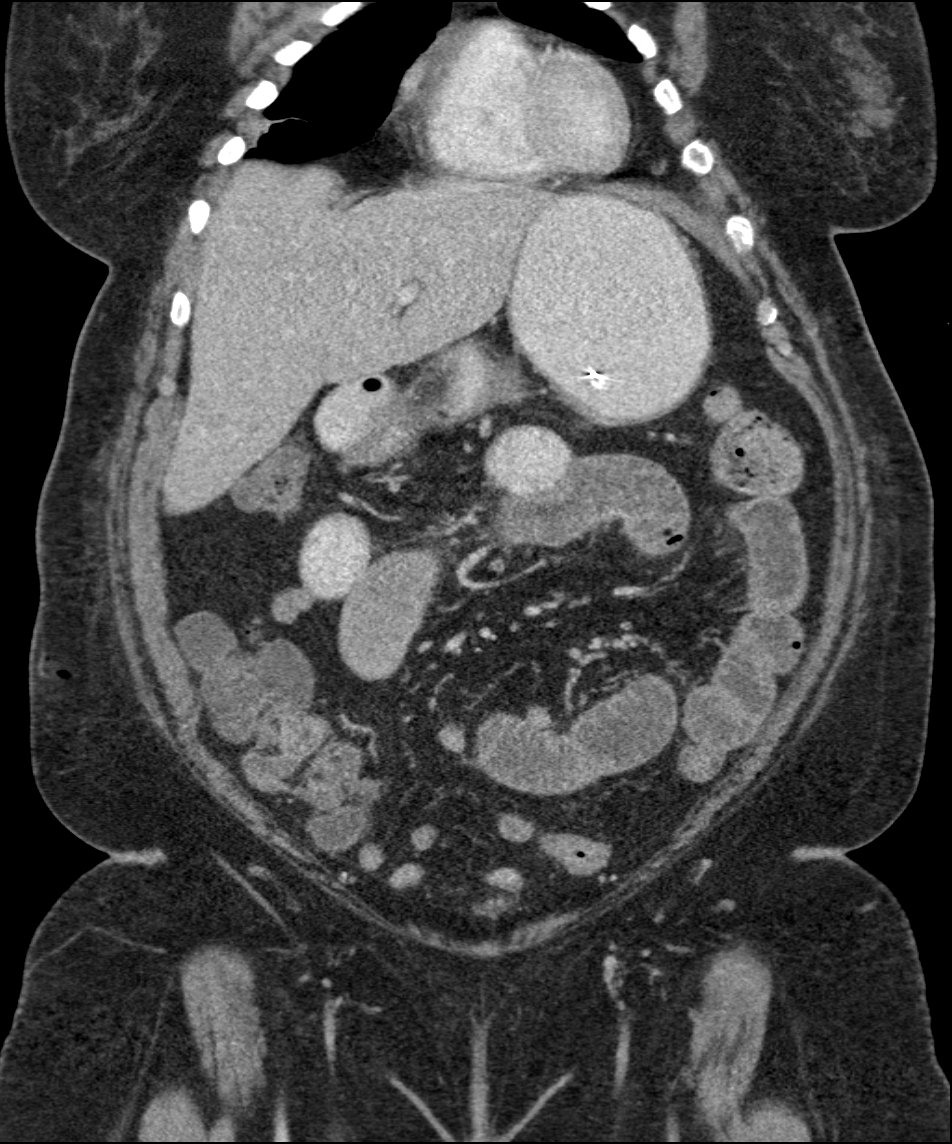
[im 68/152  soft-tissue]
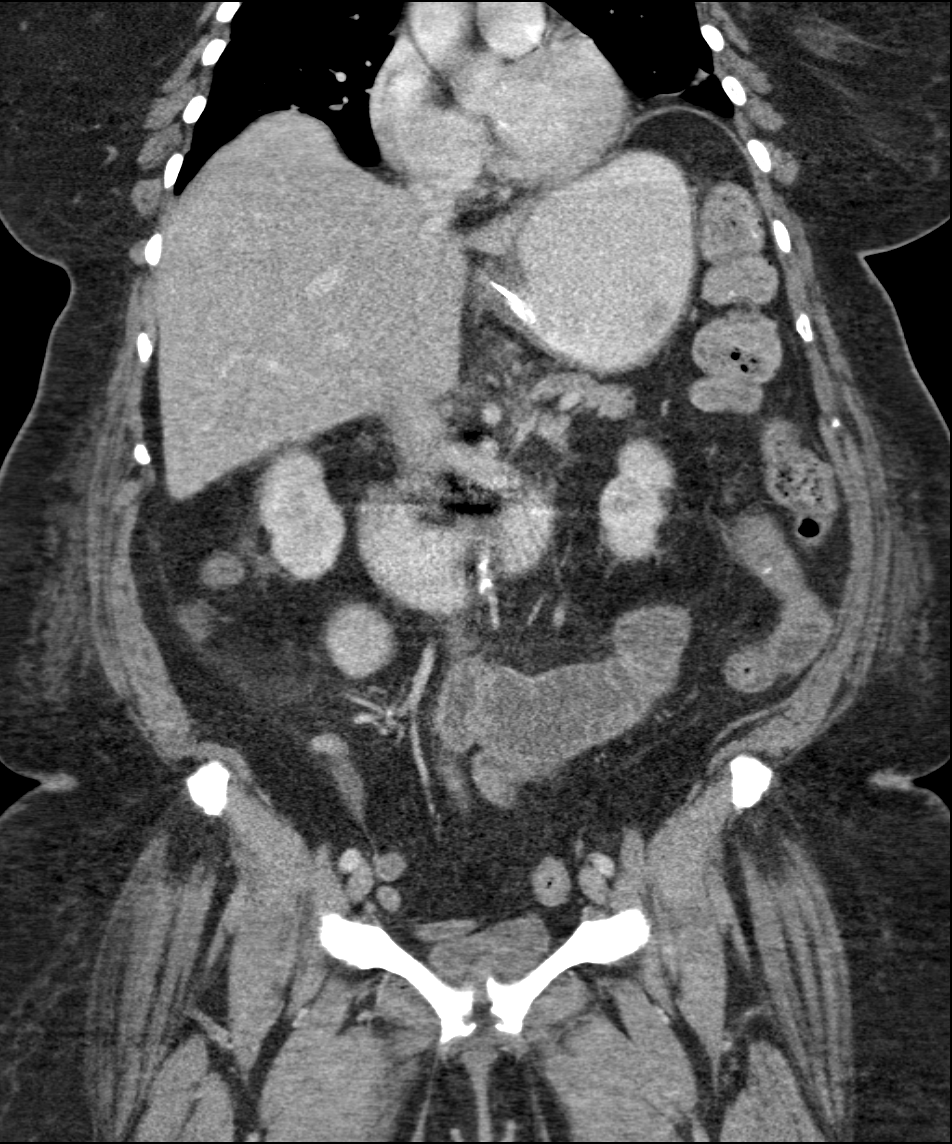
[im 84/152  soft-tissue]
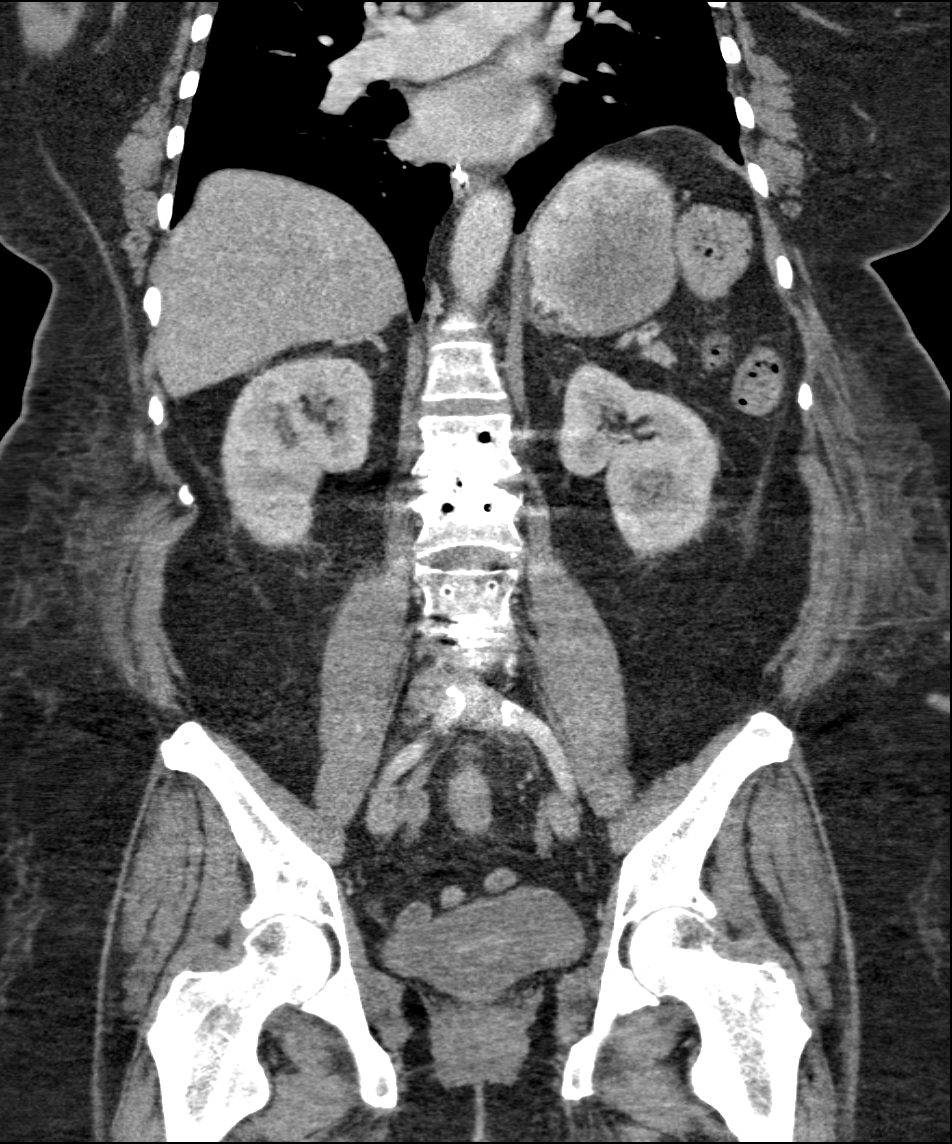

[9 of 46 positions shown; findings below may reference images not displayed]

FINDINGS: Lower chest: Increased moderate bibasilar atelectasis identified.

Hepatobiliary: The liver is unremarkable. The patient is status post
cholecystectomy. There is no evidence of biliary dilatation.

Pancreas: Unremarkable

Spleen: Unremarkable

Adrenals/Urinary Tract: The kidneys, adrenal glands and bladder are
unremarkable except for unchanged 1.7 cm left adrenal adenoma.

Stomach/Bowel: An NG tube is present with tip in the mid stomach.
Dilated proximal-mid small bowel loops are again identified with
collapsed distal small bowel loops, compatible with small bowel
obstruction. There has been interval decreased caliber of stomach
and dilated small bowel loops since the prior study. There is no
evidence of definite bowel wall thickening or pneumoperitoneum.

Vascular/Lymphatic: Abdominal aortic atherosclerotic calcifications
noted without aneurysm. No enlarged lymph nodes identified.

Reproductive: Patient is status post hysterectomy. No adnexal masses
identified.

Other: No free fluid or focal collection/abscess.

Musculoskeletal: Surgical changes within the lumbar spine again
identified. No acute or suspicious abnormality.
IMPRESSION: Small bowel obstruction again noted, with decompression of stomach
and decreased caliber of dilated small bowel loops from NG tube
within the stomach. No evidence of abscess, free fluid or
pneumoperitoneum.

Increased moderate bibasilar atelectasis.

Abdominal aortic atherosclerosis.

## 2016-12-06 IMAGING — CR DG CHEST 1V PORT
1 series · 1 of 1 positions shown · non-contrast
Comparison: 07/02/2016

CLINICAL DATA: Chest pain

EXAM:
PORTABLE CHEST 1 VIEW

[AP]
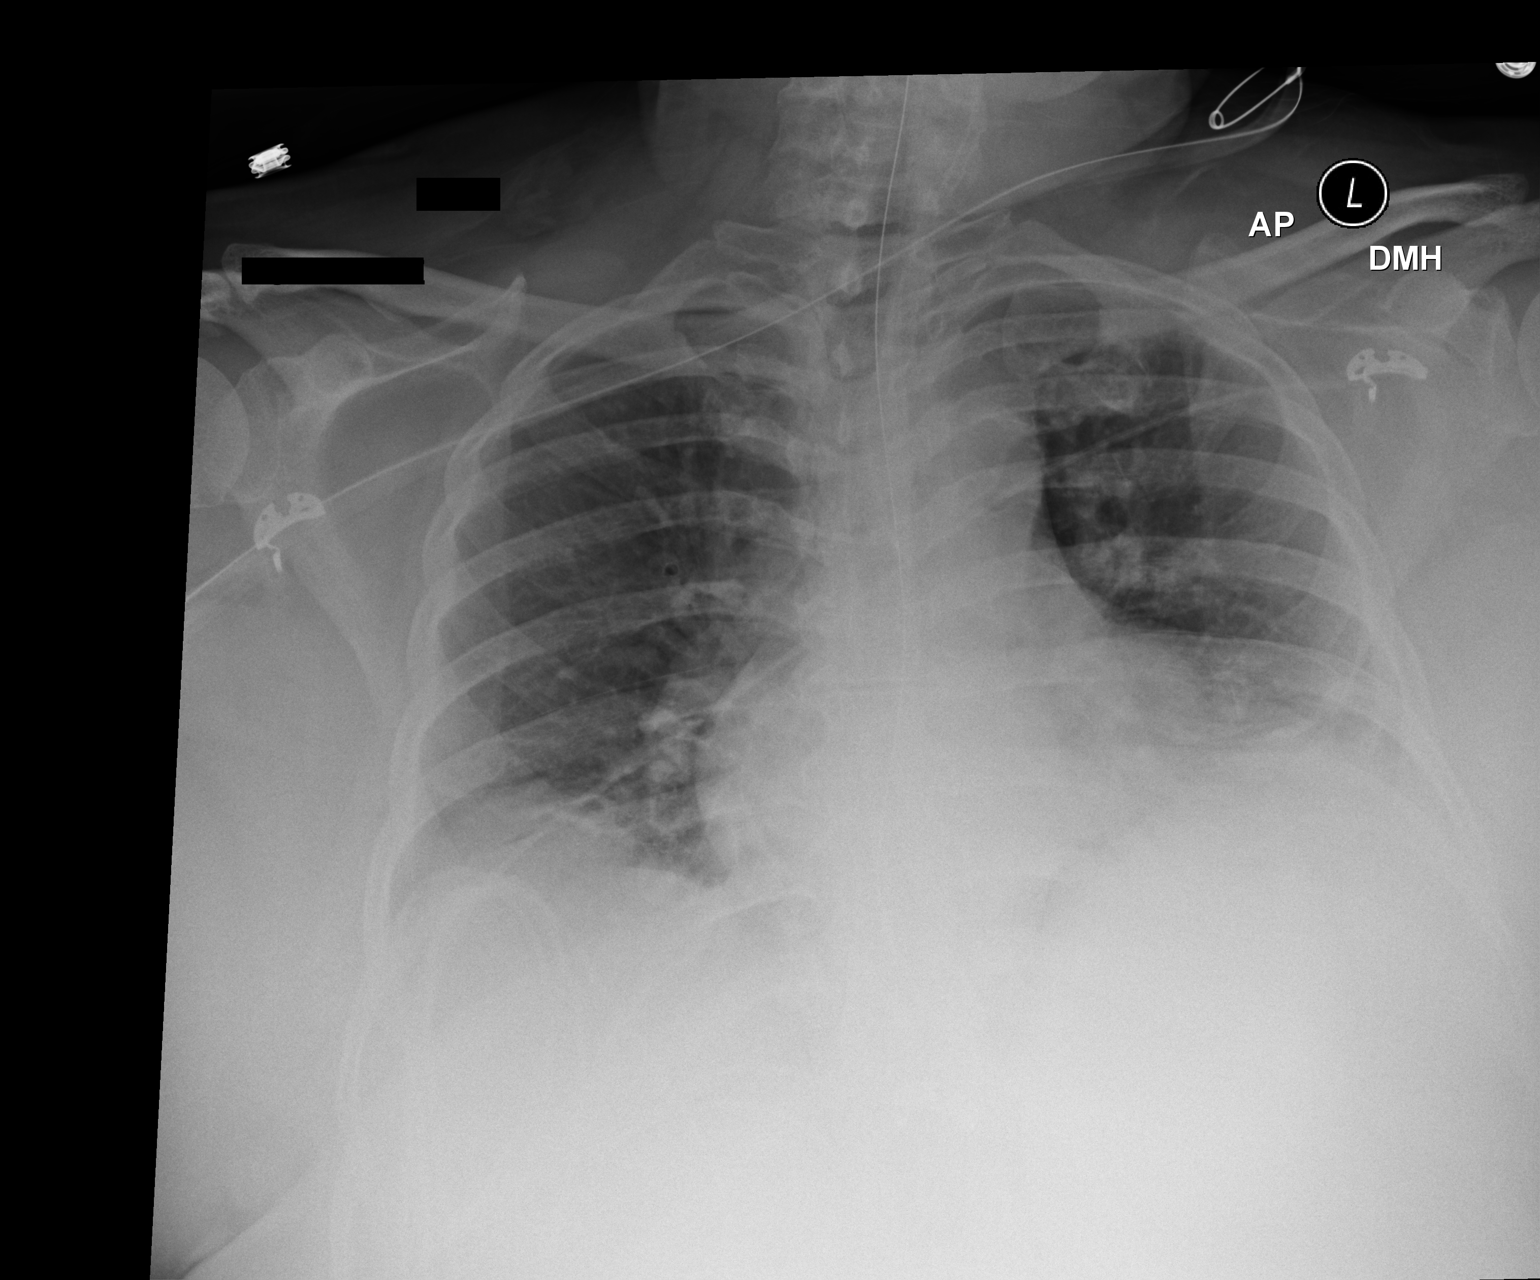

[1 of 1 positions shown; findings below may reference images not displayed]

FINDINGS: Cardiomediastinal silhouette is stable. Stable atelectasis or
scarring right base. There is streaky atelectasis or infiltrate left
base. No pulmonary edema. NG tube poorly visualized due to patient's
large body habitus.
IMPRESSION: No pulmonary edema. Right base atelectasis or scarring. Streaky left
basilar atelectasis or infiltrate.

## 2016-12-08 IMAGING — CT CT ANGIO CHEST
2 of 6 series · 18 of 36 positions shown · IV contrast (Omni 300)
Comparison: None.

CLINICAL DATA: Exploratory laparotomy yesterday. Shortness of
breath. Evaluate for pulmonary embolus.

EXAM:
CT ANGIOGRAPHY CHEST WITH CONTRAST
TECHNIQUE: Multidetector CT imaging of the chest was performed using the
standard protocol during bolus administration of intravenous
contrast. Multiplanar CT image reconstructions and MIPs were
obtained to evaluate the vascular anatomy.
CONTRAST:  100 cc Isovue 370

[Series 6: pe thins · axial · 0.59mm/px · z∈[+1488,+1667]mm · 17 of 203 slices shown]
[im 12/203  lung]
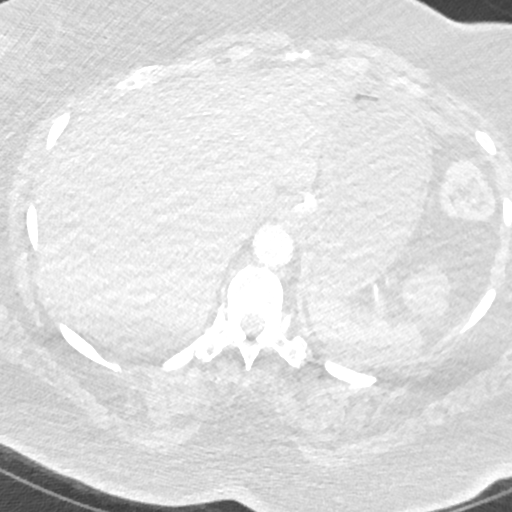
[im 23/203  mediastinal]
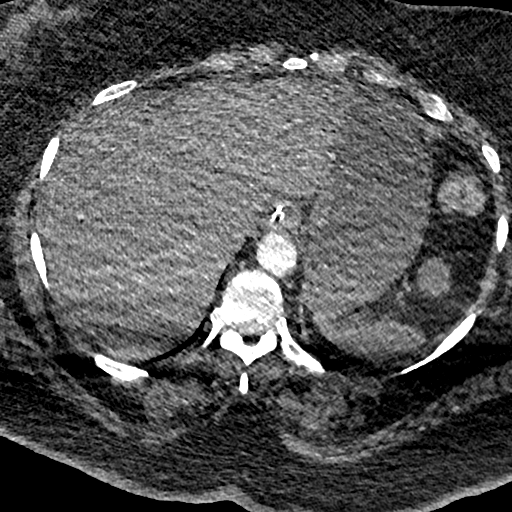
[im 34/203  lung]
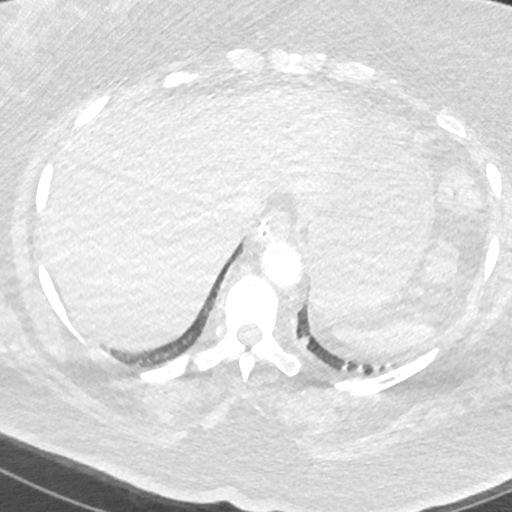
[im 45/203  mediastinal]
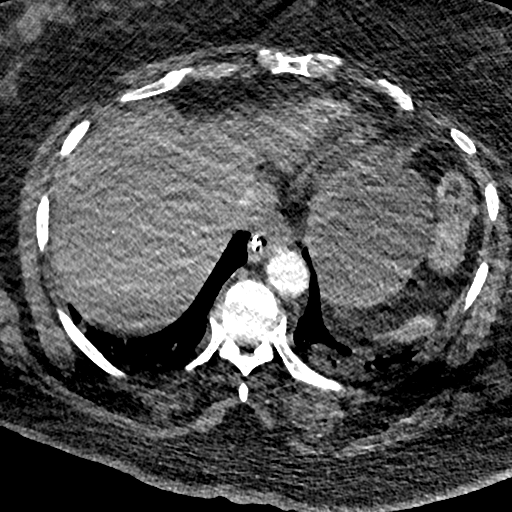
[im 57/203  lung]
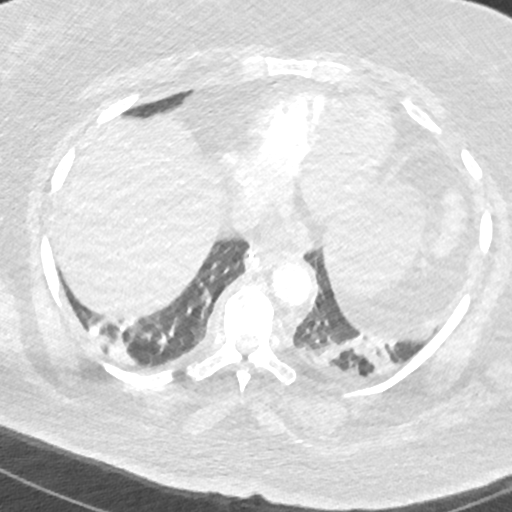
[im 68/203  mediastinal]
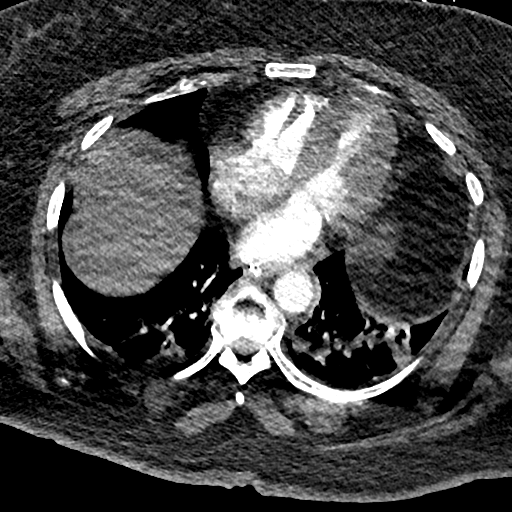
[im 79/203  lung]
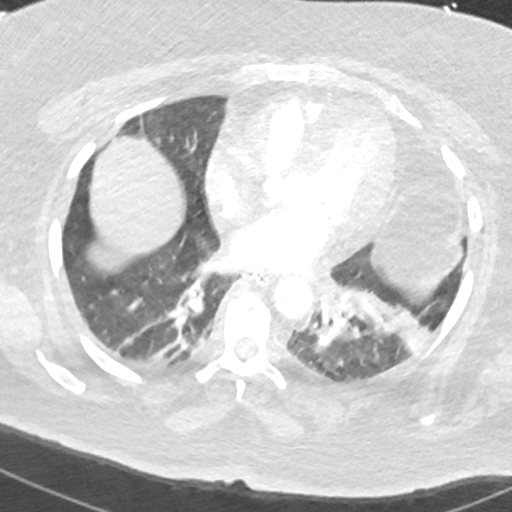
[im 90/203  mediastinal]
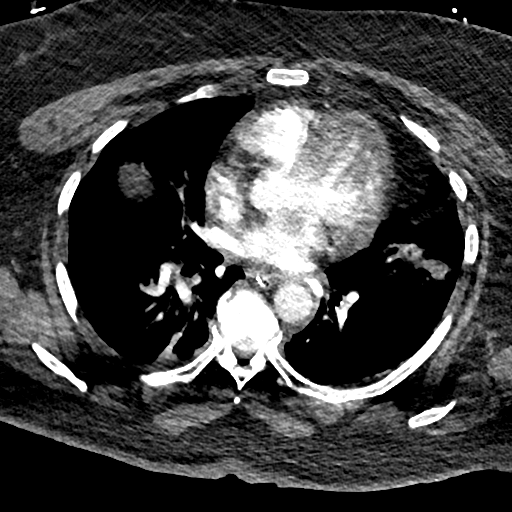
[im 102/203  lung]
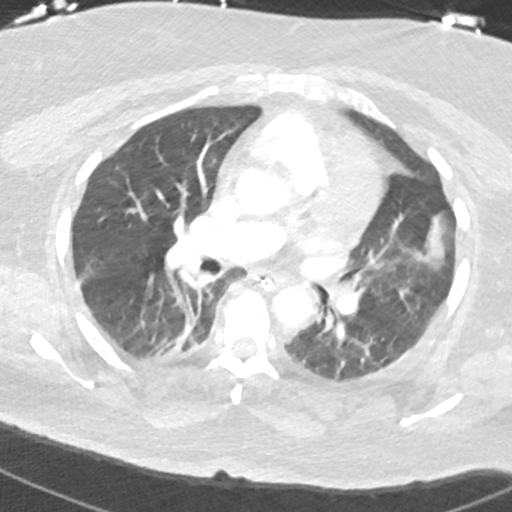
[im 113/203  mediastinal]
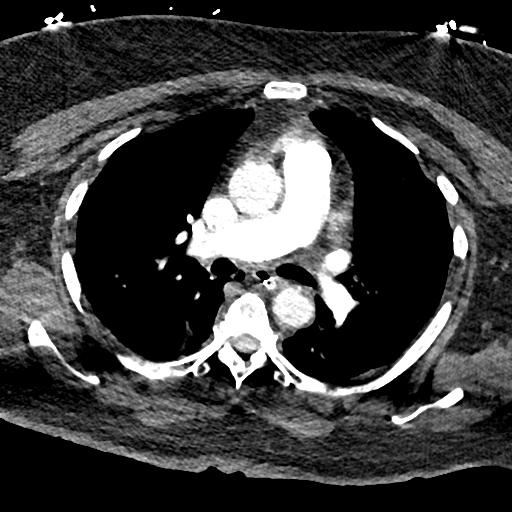
[im 124/203  lung]
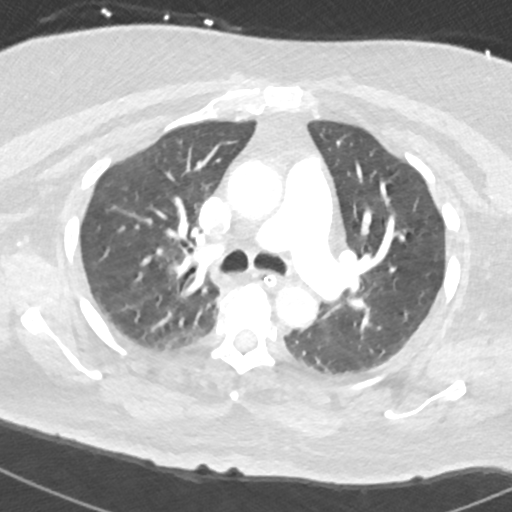
[im 135/203  mediastinal]
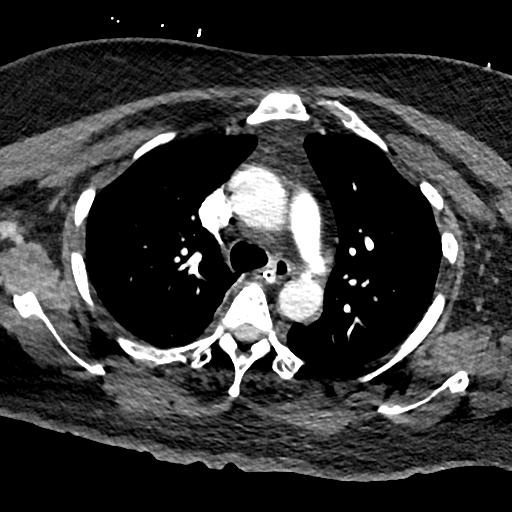
[im 146/203  lung]
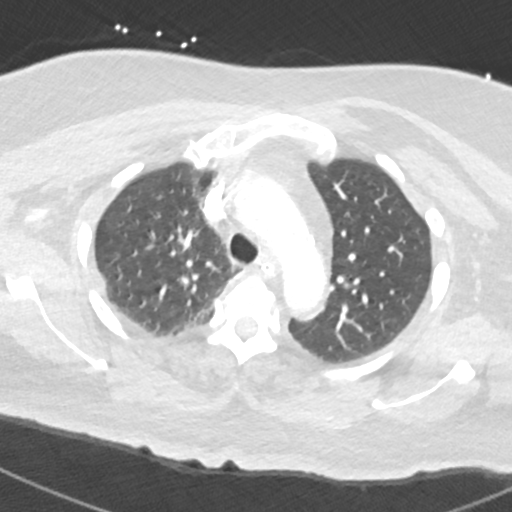
[im 158/203  mediastinal]
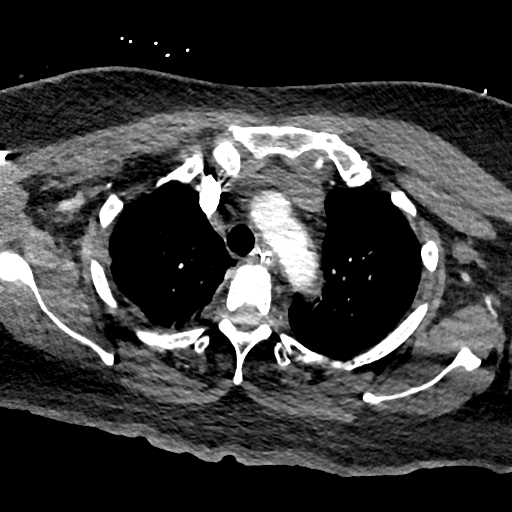
[im 169/203  lung]
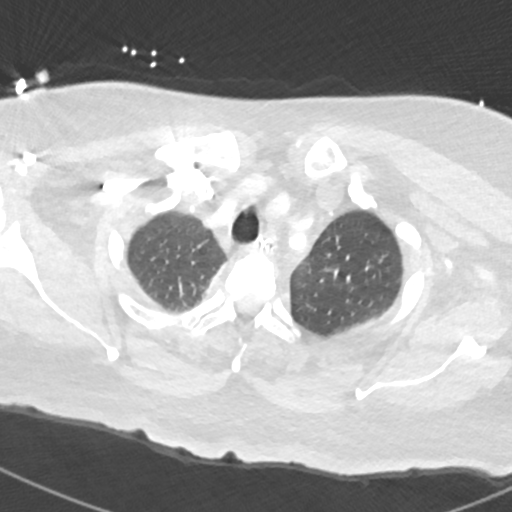
[im 180/203  mediastinal]
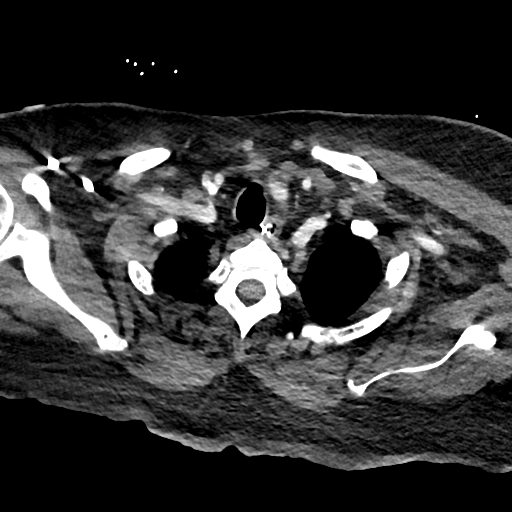
[im 191/203  lung]
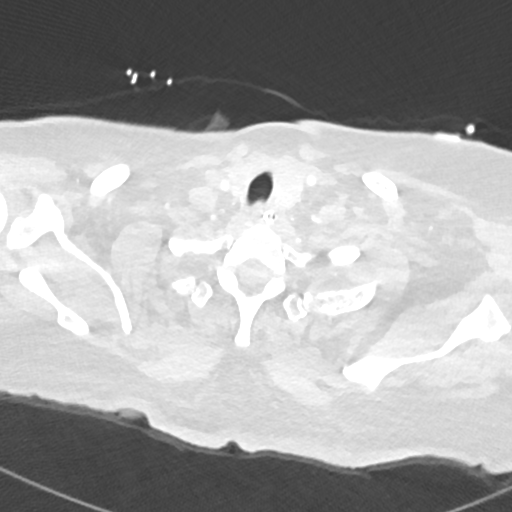

[Series 7: pe 2mm cor · coronal · 0.39mm/px · 1 of 151 slices shown]
[im 76/151  mediastinal]
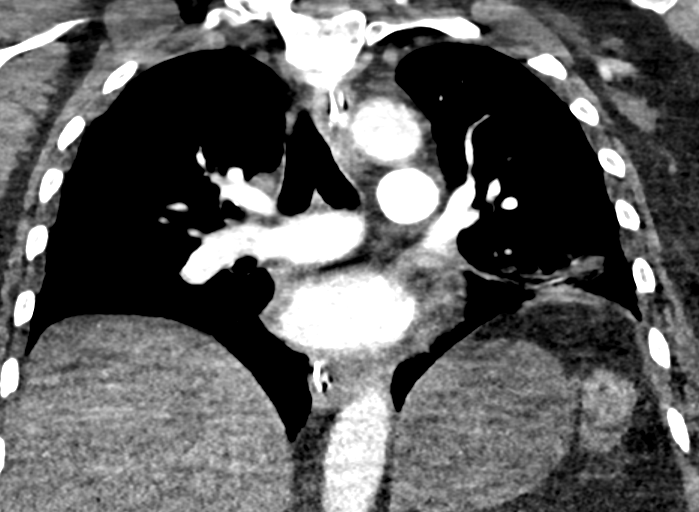

[18 of 36 positions shown; findings below may reference images not displayed]

FINDINGS: Cardiovascular: Heart size upper normal. No pericardial effusion. No
thoracic aortic aneurysm. Assessment of segmental and subsegmental
pulmonary arteries to the lower lobes is degraded by respiratory
motion, but within this limitation there is no CT evidence for an
acute pulmonary embolus.

Mediastinum/Nodes: No mediastinal lymphadenopathy. There is no hilar
lymphadenopathy. There is no axillary lymphadenopathy. NG tube
passes into the stomach with nonvisualization of the distal tip.

Lungs/Pleura: Bilateral lower lobe subsegmental atelectasis noted.
Focal bandlike opacity in the lingula likely also atelectatic. 7 mm
ground-glass nodule identified right lower lobe (image 59 series 5).
No evidence for pneumothorax. No pleural effusion.

Upper Abdomen: Stomach is fluid distended despite the presence of an
NG tube.

Musculoskeletal: Bone windows reveal no worrisome lytic or sclerotic
osseous lesions.

Review of the MIP images confirms the above findings.
IMPRESSION: 1. No CT evidence for acute pulmonary embolus.
2. Bilateral lower lobe subsegmental atelectasis with probable
lingular atelectasis associated.
3. Stomach is fluid distended despite the presence of an NG tube.

## 2016-12-11 DIAGNOSIS — M170 Bilateral primary osteoarthritis of knee: Secondary | ICD-10-CM | POA: Diagnosis not present

## 2016-12-11 DIAGNOSIS — T8131XD Disruption of external operation (surgical) wound, not elsewhere classified, subsequent encounter: Secondary | ICD-10-CM | POA: Diagnosis not present

## 2016-12-11 DIAGNOSIS — M6281 Muscle weakness (generalized): Secondary | ICD-10-CM | POA: Diagnosis not present

## 2016-12-11 DIAGNOSIS — M5137 Other intervertebral disc degeneration, lumbosacral region: Secondary | ICD-10-CM | POA: Diagnosis not present

## 2016-12-11 DIAGNOSIS — M5136 Other intervertebral disc degeneration, lumbar region: Secondary | ICD-10-CM | POA: Diagnosis not present

## 2016-12-11 DIAGNOSIS — R2689 Other abnormalities of gait and mobility: Secondary | ICD-10-CM | POA: Diagnosis not present

## 2016-12-11 DIAGNOSIS — T814XXD Infection following a procedure, subsequent encounter: Secondary | ICD-10-CM | POA: Diagnosis not present

## 2016-12-11 DIAGNOSIS — I10 Essential (primary) hypertension: Secondary | ICD-10-CM | POA: Diagnosis not present

## 2016-12-11 DIAGNOSIS — M961 Postlaminectomy syndrome, not elsewhere classified: Secondary | ICD-10-CM | POA: Diagnosis not present

## 2016-12-11 DIAGNOSIS — Z6841 Body Mass Index (BMI) 40.0 and over, adult: Secondary | ICD-10-CM | POA: Diagnosis not present

## 2016-12-11 DIAGNOSIS — M160 Bilateral primary osteoarthritis of hip: Secondary | ICD-10-CM | POA: Diagnosis not present

## 2016-12-11 IMAGING — DX DG CHEST 1V PORT
1 series · 1 of 1 positions shown · non-contrast
Comparison: 07/07/2016

CLINICAL DATA: Shortness of breath, hypertension, asthma

EXAM:
PORTABLE CHEST 1 VIEW

[chest ap]
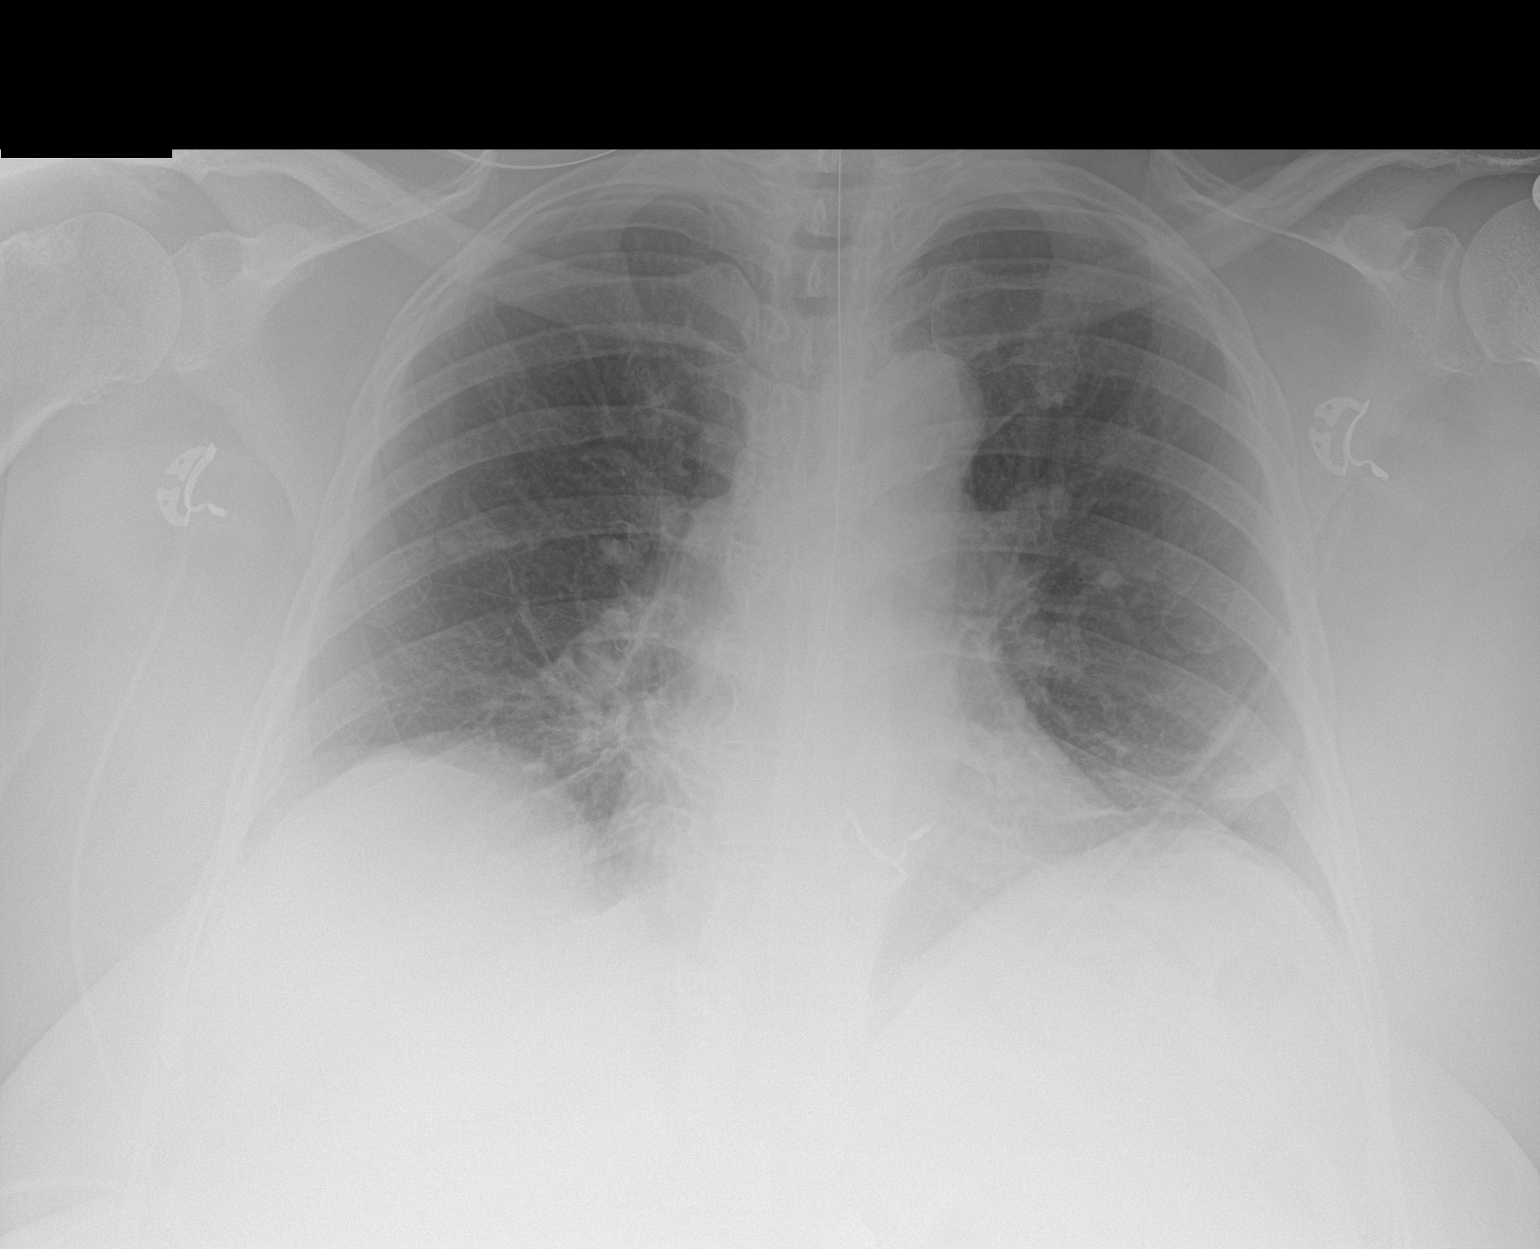

[1 of 1 positions shown; findings below may reference images not displayed]

FINDINGS: Cardiomediastinal silhouette is stable. Mild basilar atelectasis. No
pulmonary edema. NG tube in place. Linear atelectasis or infiltrate
in lingula.
IMPRESSION: No pulmonary edema. Mild basilar atelectasis. Linear atelectasis or
infiltrate in lingula.

## 2016-12-12 ENCOUNTER — Ambulatory Visit: Payer: Medicare Other

## 2016-12-14 ENCOUNTER — Ambulatory Visit
Admission: RE | Admit: 2016-12-14 | Discharge: 2016-12-14 | Disposition: A | Discharge status: Home / Self Care | Payer: Medicare Other | Source: Ambulatory Visit | Attending: Family Medicine | Admitting: Family Medicine

## 2016-12-14 DIAGNOSIS — M160 Bilateral primary osteoarthritis of hip: Secondary | ICD-10-CM | POA: Diagnosis not present

## 2016-12-14 DIAGNOSIS — Z1231 Encounter for screening mammogram for malignant neoplasm of breast: Secondary | ICD-10-CM | POA: Diagnosis not present

## 2016-12-14 DIAGNOSIS — M170 Bilateral primary osteoarthritis of knee: Secondary | ICD-10-CM | POA: Diagnosis not present

## 2016-12-14 DIAGNOSIS — M6281 Muscle weakness (generalized): Secondary | ICD-10-CM | POA: Diagnosis not present

## 2016-12-14 DIAGNOSIS — T814XXD Infection following a procedure, subsequent encounter: Secondary | ICD-10-CM | POA: Diagnosis not present

## 2016-12-14 DIAGNOSIS — T8131XD Disruption of external operation (surgical) wound, not elsewhere classified, subsequent encounter: Secondary | ICD-10-CM | POA: Diagnosis not present

## 2016-12-14 DIAGNOSIS — R2689 Other abnormalities of gait and mobility: Secondary | ICD-10-CM | POA: Diagnosis not present

## 2016-12-20 DIAGNOSIS — Z5189 Encounter for other specified aftercare: Secondary | ICD-10-CM | POA: Diagnosis not present

## 2016-12-21 IMAGING — CR DG CHEST 1V PORT
1 series · 1 of 1 positions shown · non-contrast
Comparison: Prior radiograph from 07/10/2016.

CLINICAL DATA: Initial evaluation for acute shortness of breath.

EXAM:
PORTABLE CHEST 1 VIEW

[AP]
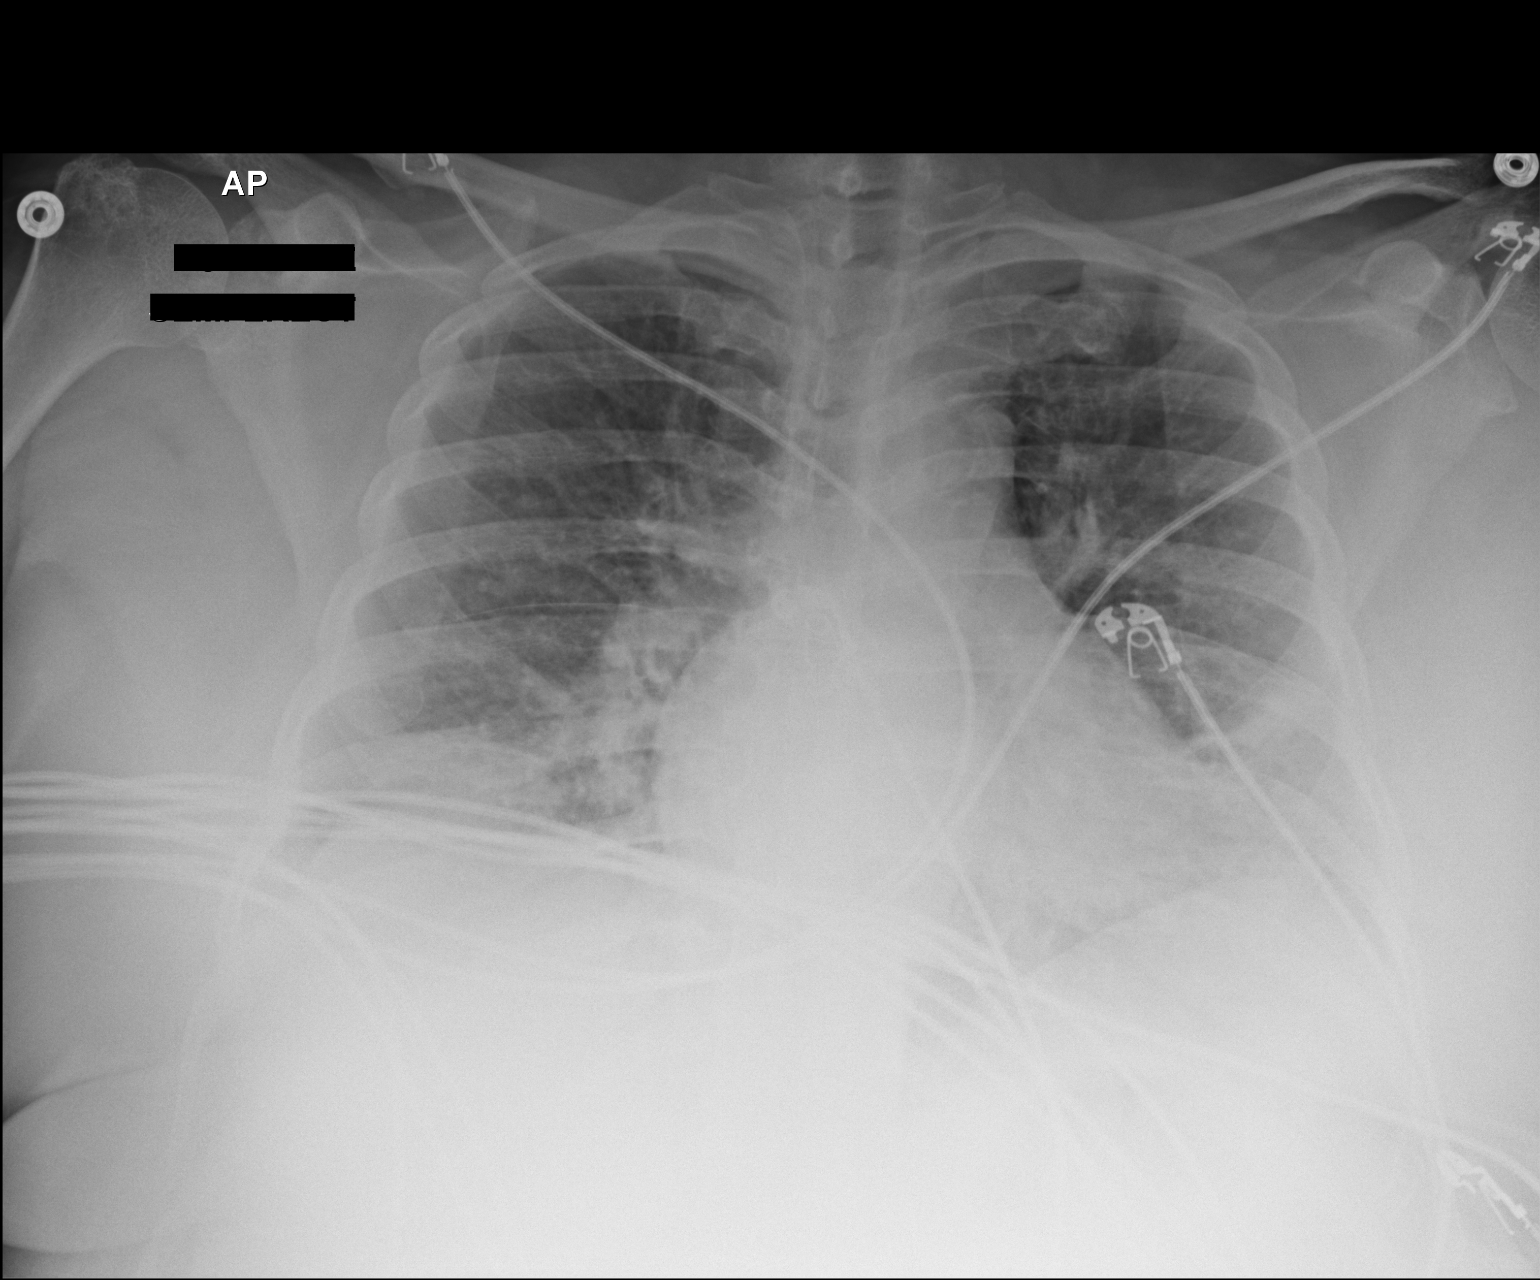

[1 of 1 positions shown; findings below may reference images not displayed]

FINDINGS: Mild cardiomegaly is stable. Mediastinal silhouette within normal
limits.

Lungs are hypoinflated. Diffuse vascular congestion with
indistinctness of the interstitial markings, suggesting mild/ early
pulmonary edema. Superimposed linear opacity at the left lung base
most compatible with atelectasis/ scar, similar to prior. No
definite focal infiltrates. No pleural effusion. No pneumothorax.

No acute osseus abnormality.
IMPRESSION: 1. Stable cardiomegaly with mild diffuse pulmonary vascular
congestion and indistinctness of the interstitial markings,
suggesting mild and/or developing pulmonary edema.
2. Stable left basilar atelectasis and/or scar.

## 2016-12-22 IMAGING — CT CT ANGIO CHEST
3 of 11 series · 16 of 37 positions shown · IV contrast (Omni 300)
Comparison: None.

CLINICAL DATA: Shortness of breath. Intra abdominal infection.
Cellulitis.

EXAM:
CT ANGIOGRAPHY CHEST
CT ABDOMEN AND PELVIS WITH CONTRAST
TECHNIQUE: Multidetector CT imaging of the chest was performed using the
standard protocol during bolus administration of intravenous
contrast. Multiplanar CT image reconstructions and MIPs were
obtained to evaluate the vascular anatomy. Multidetector CT imaging
of the abdomen and pelvis was performed using the standard protocol
during bolus administration of intravenous contrast.
CONTRAST:  100 mL Isovue 370

[Series 7: pe 2mm · axial · 0.62mm/px · z∈[+1232,+1384]mm · 5 of 116 slices shown]
[im 20/116  lung]
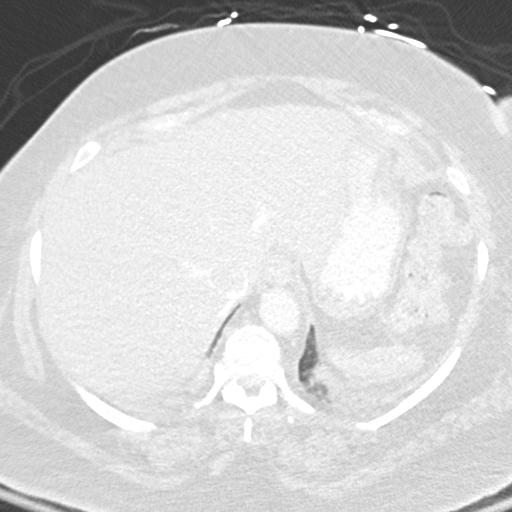
[im 39/116  lung]
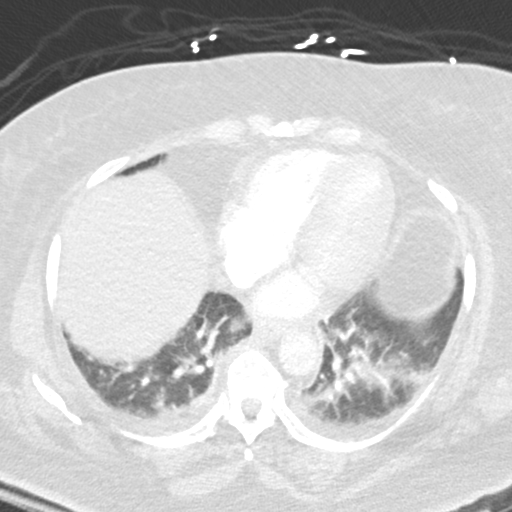
[im 58/116  lung]
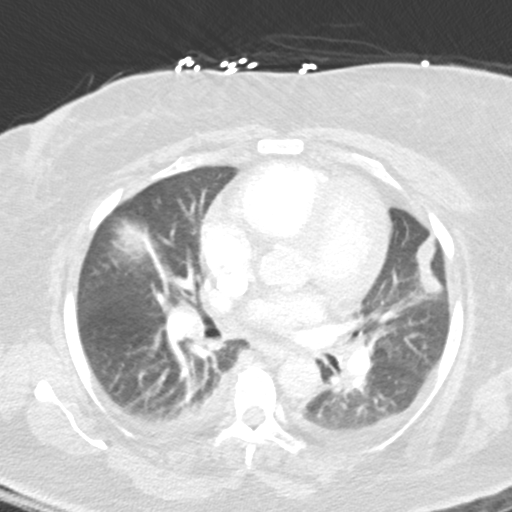
[im 77/116  lung]
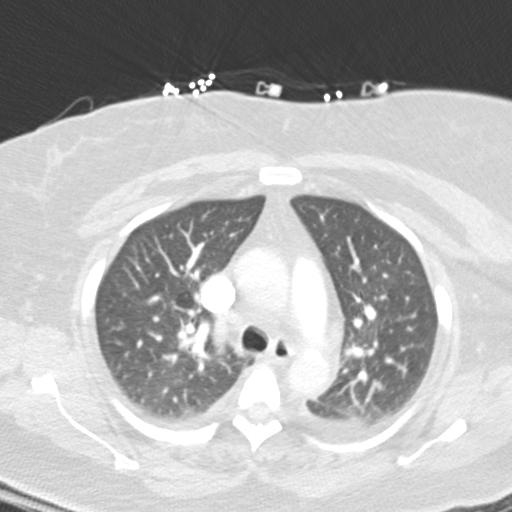
[im 96/116  lung]
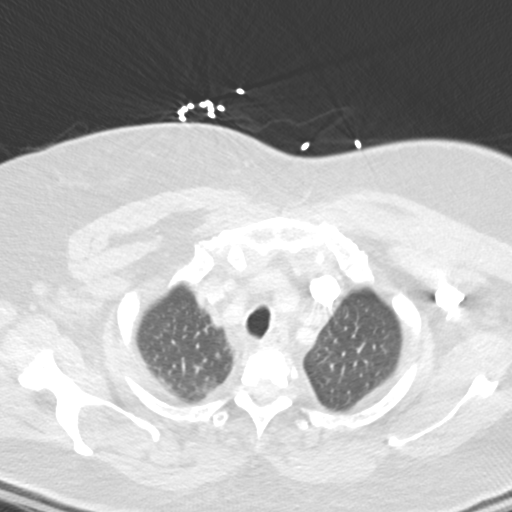

[Series 9: pe thins · axial · 0.62mm/px · z∈[+1213,+1404]mm · 8 of 231 slices shown]
[im 20/231  lung]
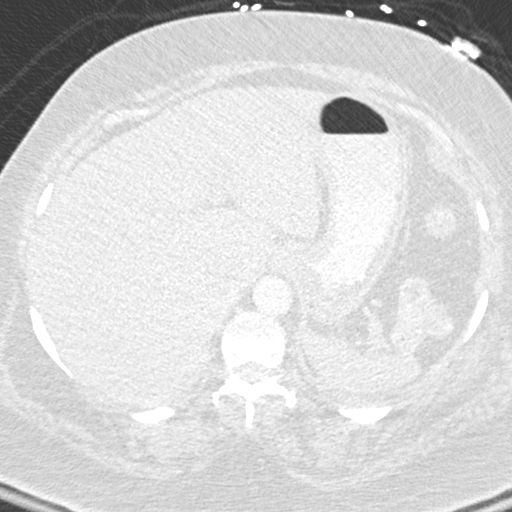
[im 58/231  lung]
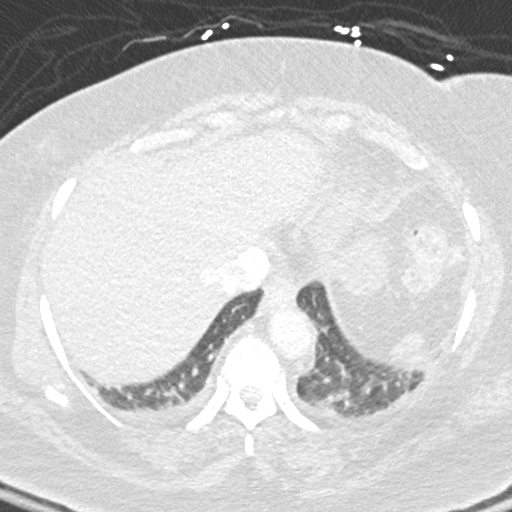
[im 77/231  lung]
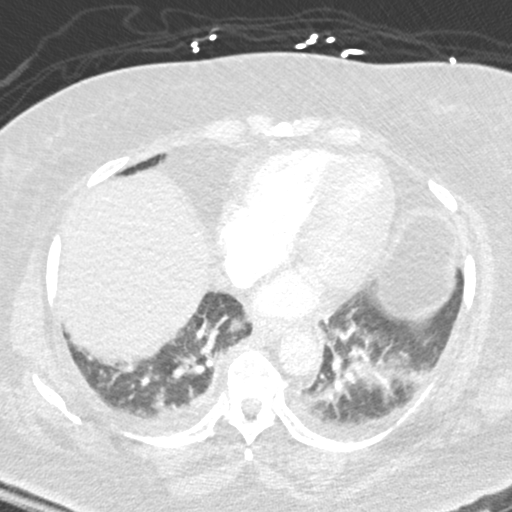
[im 96/231  lung]
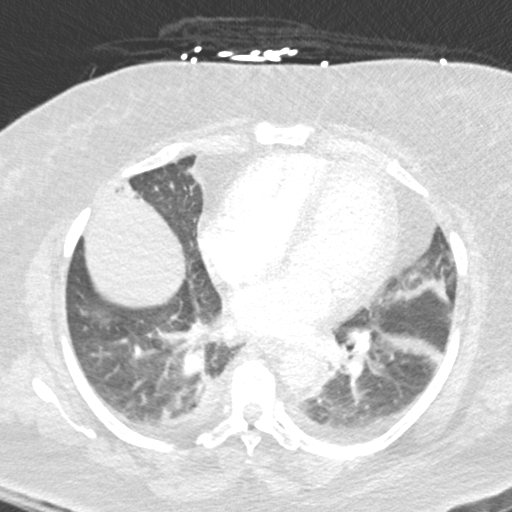
[im 135/231  lung]
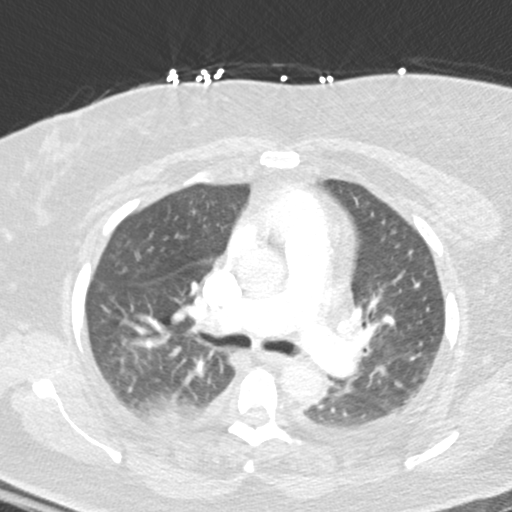
[im 154/231  lung]
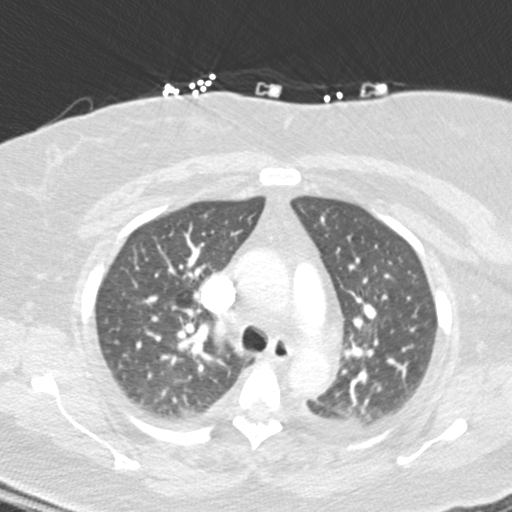
[im 173/231  lung]
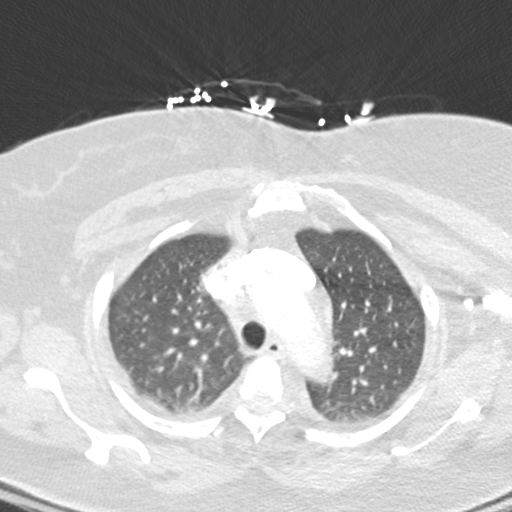
[im 211/231  lung]
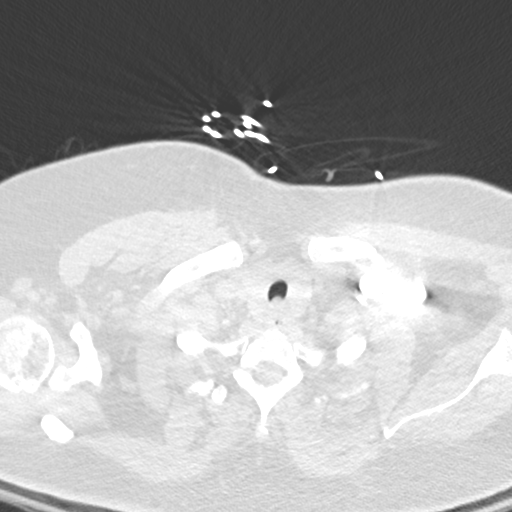

[Series 14: a/p w/ 5mm · axial · 0.78mm/px · z∈[+977,+1197]mm · 3 of 89 slices shown]
[im 23/89  lung]
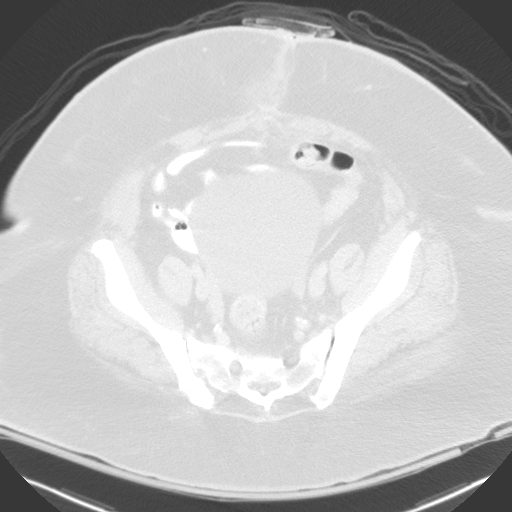
[im 45/89  mediastinal]
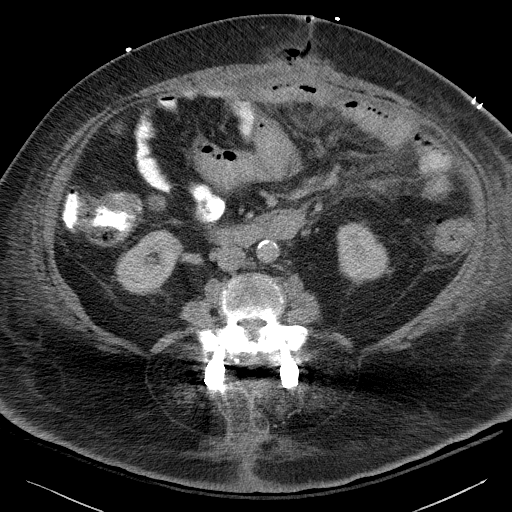
[im 67/89  lung]
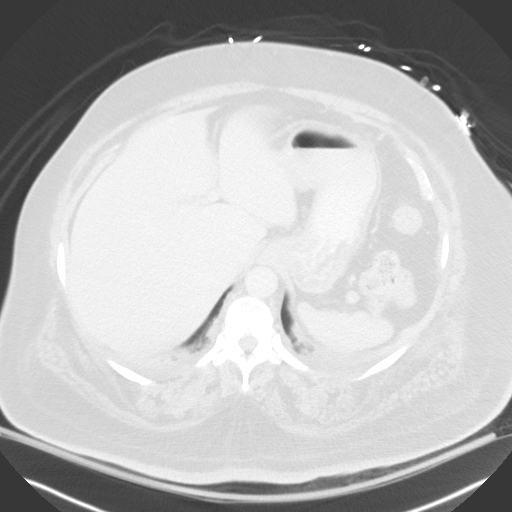

[16 of 37 positions shown; findings below may reference images not displayed]

FINDINGS: CTA CHEST FINDINGS

Cardiovascular: Satisfactory opacification of the pulmonary arteries
to the segmental level. No evidence of pulmonary embolism. Normal
heart size. No pericardial effusion. Normal caliber thoracic aorta
with mild atherosclerosis. No thoracic aortic dissection.

Mediastinum/Nodes: No enlarged mediastinal, hilar, or axillary lymph
nodes. Thyroid gland, trachea, and esophagus demonstrate no
significant findings.

Lungs/Pleura: Trace bilateral pleural effusions. Mild bibasilar
atelectasis. No focal consolidation. No pneumothorax.

Musculoskeletal: No acute osseous abnormality. Mild mid thoracic
degenerative disc disease. No aggressive lytic or sclerotic osseous
lesion.

Review of the MIP images confirms the above findings.

CT ABDOMEN and PELVIS FINDINGS

Hepatobiliary: No focal liver abnormality is seen. Status post
cholecystectomy. No biliary dilatation.

Pancreas: Unremarkable. No pancreatic ductal dilatation or
surrounding inflammatory changes.

Spleen: Normal in size without focal abnormality.

Adrenals/Urinary Tract: Adrenal glands are unremarkable. Kidneys are
normal, without renal calculi, focal lesion, or hydronephrosis.
Bladder is unremarkable.

Stomach/Bowel: Midline anterior abdominal wound with air within the
wound track and surrounding mild inflammatory changes concerning for
cellulitis. No drainable fluid collection. Multiple loops of small
bowel are matted along the anterior abdominal wall just beneath the
incision site with wall thickening most concerning for enteritis.
Mild distention of small bowel loops measuring approximately 3.6 cm
in diameter concerning for partial small bowel obstruction. No
pneumatosis, pneumoperitoneum or portal venous gas.

Vascular/Lymphatic: Normal caliber abdominal aorta with
atherosclerosis. No lymphadenopathy.

Reproductive: Status post hysterectomy. No adnexal masses.

Other: No fluid collection or hematoma.

Musculoskeletal: Anterior lumbar interbody fusion at L4-5 and L5-S1
with solid osseous bridging. Posterior lumbar interbody fusion at
L3-4 with solid osseous bridging across the L3-4 disc space and
posterior elements. Posterior lumbar fusion at L2-3 with solid
osseous fusion across the posterior elements. Posterior lumbar
fusion at L1-2 with interbody prosthesis and laminectomy. No acute
osseous abnormality. No aggressive lytic or sclerotic osseous
lesion.

Review of the MIP images confirms the above findings.
IMPRESSION: 1. No of evidence of pulmonary embolus.
2. Trace bilateral pleural effusions and atelectasis.
3. Midline anterior abdominal wound with air within the wound track
and surrounding mild inflammatory changes concerning for cellulitis.
No drainable fluid collection.
4. Multiple loops of small bowel are matted along the anterior
abdominal wall just beneath the incision site with wall thickening
most concerning for enteritis. Mild distention of small bowel loops
measuring approximately 3.6 cm in diameter concerning for partial
small bowel obstruction.

## 2017-01-01 DIAGNOSIS — M797 Fibromyalgia: Secondary | ICD-10-CM | POA: Diagnosis not present

## 2017-01-01 DIAGNOSIS — F39 Unspecified mood [affective] disorder: Secondary | ICD-10-CM | POA: Diagnosis not present

## 2017-01-01 DIAGNOSIS — M4802 Spinal stenosis, cervical region: Secondary | ICD-10-CM | POA: Diagnosis not present

## 2017-01-01 DIAGNOSIS — K224 Dyskinesia of esophagus: Secondary | ICD-10-CM | POA: Diagnosis not present

## 2017-01-01 DIAGNOSIS — M1990 Unspecified osteoarthritis, unspecified site: Secondary | ICD-10-CM | POA: Diagnosis not present

## 2017-01-01 DIAGNOSIS — G4730 Sleep apnea, unspecified: Secondary | ICD-10-CM | POA: Diagnosis not present

## 2017-01-01 DIAGNOSIS — K219 Gastro-esophageal reflux disease without esophagitis: Secondary | ICD-10-CM | POA: Diagnosis not present

## 2017-01-01 DIAGNOSIS — Z0000 Encounter for general adult medical examination without abnormal findings: Secondary | ICD-10-CM | POA: Diagnosis not present

## 2017-01-01 DIAGNOSIS — J301 Allergic rhinitis due to pollen: Secondary | ICD-10-CM | POA: Diagnosis not present

## 2017-01-01 DIAGNOSIS — E7800 Pure hypercholesterolemia, unspecified: Secondary | ICD-10-CM | POA: Diagnosis not present

## 2017-01-01 DIAGNOSIS — I10 Essential (primary) hypertension: Secondary | ICD-10-CM | POA: Diagnosis not present

## 2017-01-01 DIAGNOSIS — R490 Dysphonia: Secondary | ICD-10-CM | POA: Diagnosis not present

## 2017-01-02 DIAGNOSIS — Z780 Asymptomatic menopausal state: Secondary | ICD-10-CM | POA: Diagnosis not present

## 2017-01-02 DIAGNOSIS — Z13820 Encounter for screening for osteoporosis: Secondary | ICD-10-CM | POA: Diagnosis not present

## 2017-01-22 ENCOUNTER — Ambulatory Visit: Payer: Medicare Other | Admitting: Physical Therapy

## 2017-01-24 ENCOUNTER — Ambulatory Visit: Payer: Medicare Other | Admitting: Physical Therapy

## 2017-01-28 ENCOUNTER — Encounter: Payer: Medicare Other | Admitting: Physical Therapy

## 2017-01-31 ENCOUNTER — Encounter: Payer: Medicare Other | Admitting: Physical Therapy

## 2017-02-11 ENCOUNTER — Encounter: Payer: Medicare Other | Admitting: Physical Therapy

## 2017-02-14 ENCOUNTER — Encounter: Payer: Medicare Other | Admitting: Physical Therapy

## 2017-02-26 ENCOUNTER — Other Ambulatory Visit: Payer: Self-pay | Admitting: Family Medicine

## 2017-03-19 DIAGNOSIS — I10 Essential (primary) hypertension: Secondary | ICD-10-CM | POA: Diagnosis not present

## 2017-03-19 DIAGNOSIS — Z6841 Body Mass Index (BMI) 40.0 and over, adult: Secondary | ICD-10-CM | POA: Diagnosis not present

## 2017-03-19 DIAGNOSIS — M5136 Other intervertebral disc degeneration, lumbar region: Secondary | ICD-10-CM | POA: Diagnosis not present

## 2017-03-19 DIAGNOSIS — M961 Postlaminectomy syndrome, not elsewhere classified: Secondary | ICD-10-CM | POA: Diagnosis not present

## 2017-03-19 DIAGNOSIS — M5137 Other intervertebral disc degeneration, lumbosacral region: Secondary | ICD-10-CM | POA: Diagnosis not present

## 2017-04-02 IMAGING — CT CT L SPINE W/O CM
2 of 11 series · 7 of 27 positions shown, 8 images · non-contrast
Comparison: CT abdomen 07/21/2016, 07/05/2016, CT lumbar spine
06/01/2016

CLINICAL DATA: Right hip pain, right leg weakness

EXAM:
CT LUMBAR SPINE WITHOUT CONTRAST
TECHNIQUE: Multidetector CT imaging of the lumbar spine was performed without
intravenous contrast administration. Multiplanar CT image
reconstructions were also generated.

[Series 5: l spine detail · axial · 0.44mm/px · z∈[+46,+144]mm · 2 of 117 slices shown, 3 images]
[im 39/117  soft-tissue]
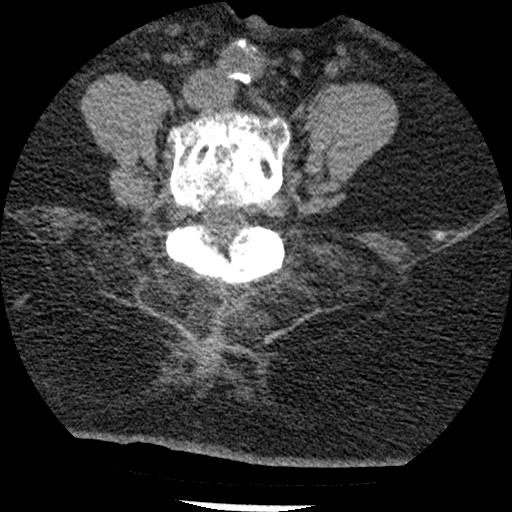
[im 39/117  bone]
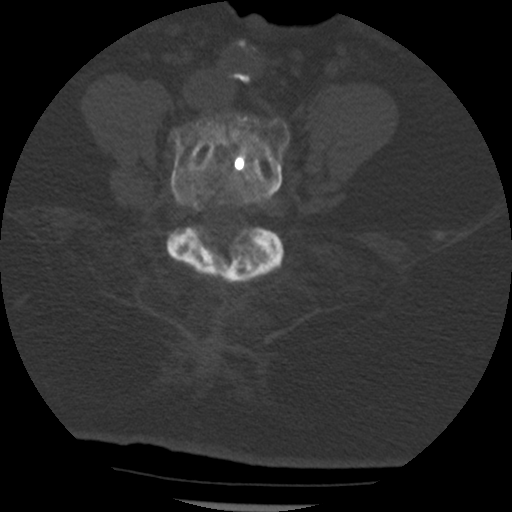
[im 78/117  bone]
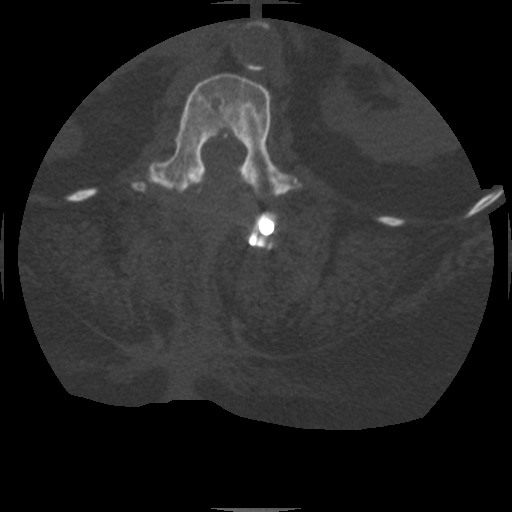

[Series 200: sagittal · sagittal · 0.58mm/px · 5 of 78 slices shown]
[im 13/78  bone]
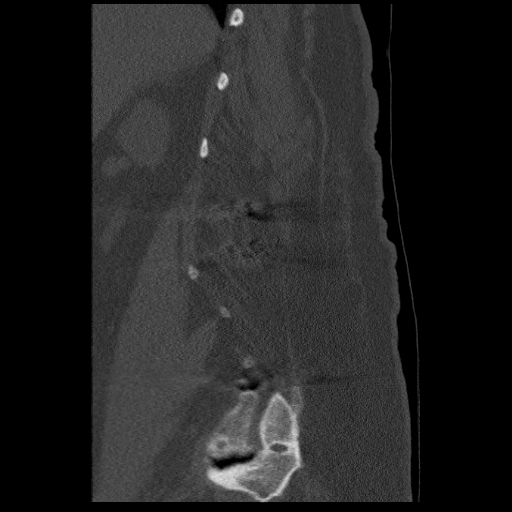
[im 26/78  bone]
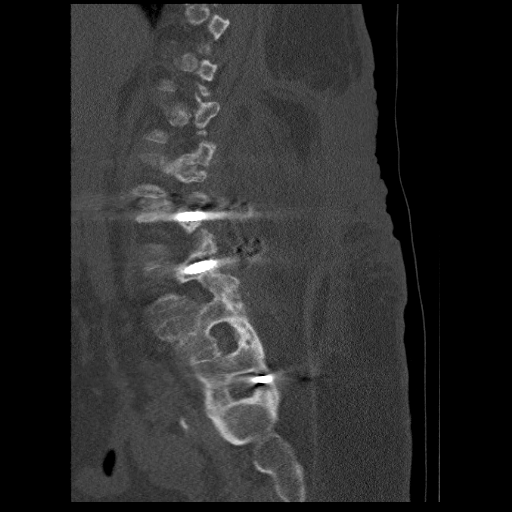
[im 39/78  bone]
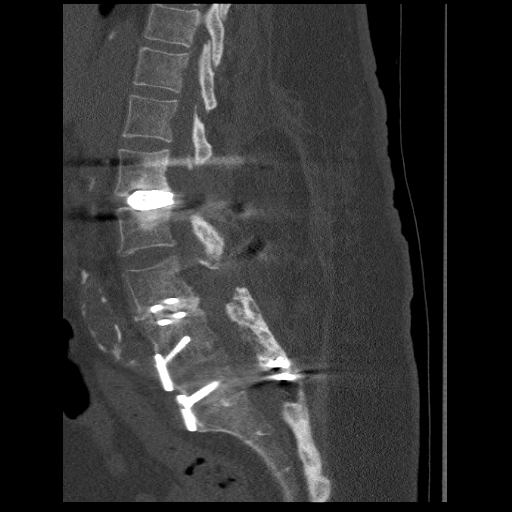
[im 52/78  bone]
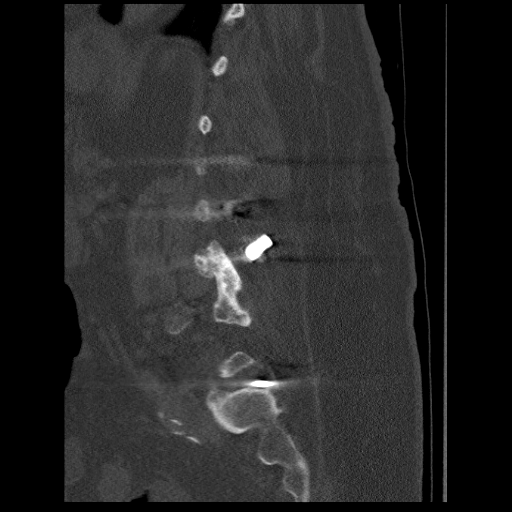
[im 65/78  bone]
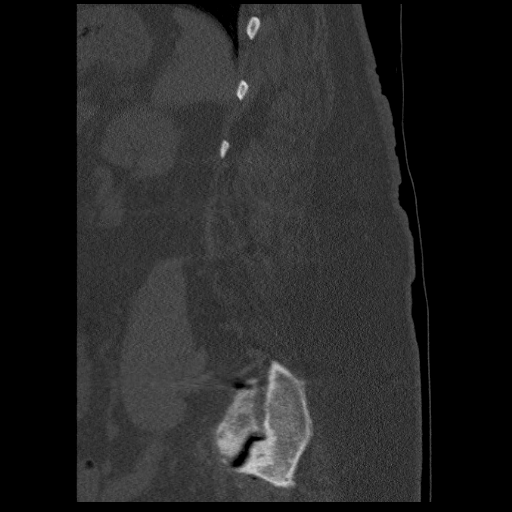

[7 of 27 positions shown; findings below may reference images not displayed]

FINDINGS: Segmentation:  Anatomic.

Alignment: Normal.

Vertebrae: No acute fracture or focal pathologic process. Severe
osteoarthritis of the sacroiliac joints bilaterally.

Paraspinal and other soft tissues: Negative.

Disc levels: Posterior lumbar fusion from L1 through S1. Interbody
spacer device at L1-2 with retropulsion of the right interbody cage
by 4 mm into the canal and 1 mm retropulsion of the left interbody
cage into the canal. Solid osseous fusion across the L3-4, L4-5 and
L5-S1 disc spaces. Solid osseous fusion of the posterior elements
from L2 through S1. No solid arthrodesis of the posterior elements
of L1-2. Bilateral pedicle screws at L2-3, L3-4. Unilateral left
pedicle screw at L1 with surrounding lucency consistent with
loosening which has progressed compared with 06/01/2016. Posterior
decompression at L1-2 and L3-4. No significant foraminal stenosis
throughout the lumbar spine.
IMPRESSION: 1. Instrumented fusion from L2 through S1 without failure or
complication.
2. Interbody spacer device at L1-2 with retropulsion of the right
interbody cage protruding 4 mm into the canal which has slightly
increased compared with 06/01/2016. No arthrodesis across the
posterior elements of L1-2. Periscrew lucency around the left
pedicle screw at L1 consistent with loosening which has also
increased compared with the prior exam.

## 2017-05-03 DIAGNOSIS — M797 Fibromyalgia: Secondary | ICD-10-CM | POA: Diagnosis not present

## 2017-05-03 DIAGNOSIS — G629 Polyneuropathy, unspecified: Secondary | ICD-10-CM | POA: Diagnosis not present

## 2017-05-03 DIAGNOSIS — M48061 Spinal stenosis, lumbar region without neurogenic claudication: Secondary | ICD-10-CM | POA: Diagnosis not present

## 2017-05-03 DIAGNOSIS — F39 Unspecified mood [affective] disorder: Secondary | ICD-10-CM | POA: Diagnosis not present

## 2017-05-03 DIAGNOSIS — K224 Dyskinesia of esophagus: Secondary | ICD-10-CM | POA: Diagnosis not present

## 2017-05-03 DIAGNOSIS — I10 Essential (primary) hypertension: Secondary | ICD-10-CM | POA: Diagnosis not present

## 2017-05-03 DIAGNOSIS — M5126 Other intervertebral disc displacement, lumbar region: Secondary | ICD-10-CM | POA: Diagnosis not present

## 2017-05-03 DIAGNOSIS — E7800 Pure hypercholesterolemia, unspecified: Secondary | ICD-10-CM | POA: Diagnosis not present

## 2017-05-03 DIAGNOSIS — R399 Unspecified symptoms and signs involving the genitourinary system: Secondary | ICD-10-CM | POA: Diagnosis not present

## 2017-05-03 DIAGNOSIS — K219 Gastro-esophageal reflux disease without esophagitis: Secondary | ICD-10-CM | POA: Diagnosis not present

## 2017-05-09 ENCOUNTER — Other Ambulatory Visit: Payer: Self-pay | Admitting: Family

## 2017-05-17 IMAGING — MG 2D DIGITAL SCREENING BILATERAL MAMMOGRAM WITH CAD AND ADJUNCT TO
8 of 12 series · 8 of 28 positions shown · non-contrast
Comparison: Previous exam(s).

CLINICAL DATA: Screening.

EXAM:
2D DIGITAL SCREENING BILATERAL MAMMOGRAM WITH CAD AND ADJUNCT TOMO

[L MLO]
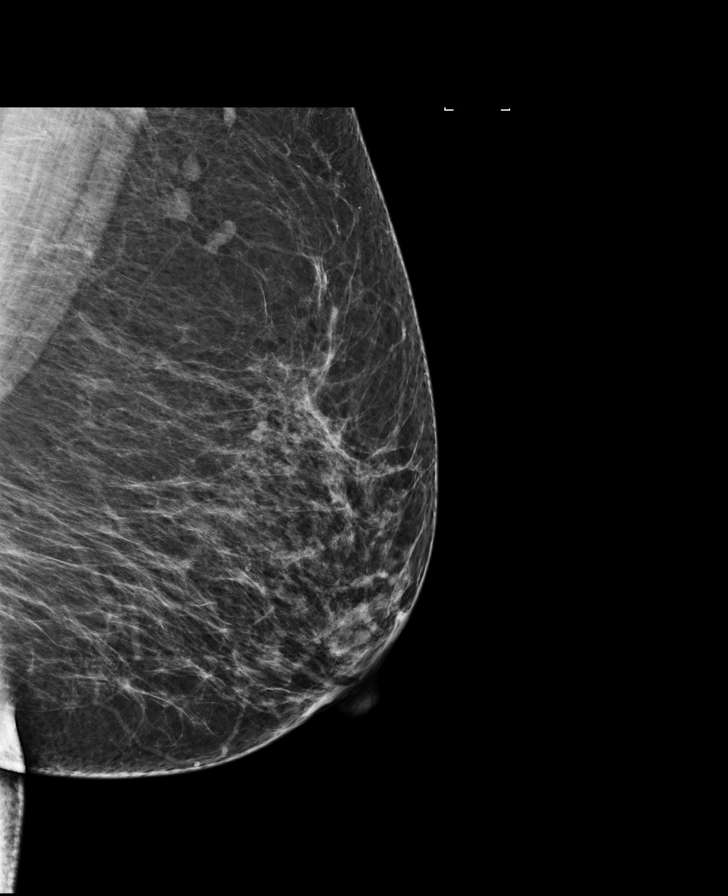

[R MLO synth-2D]
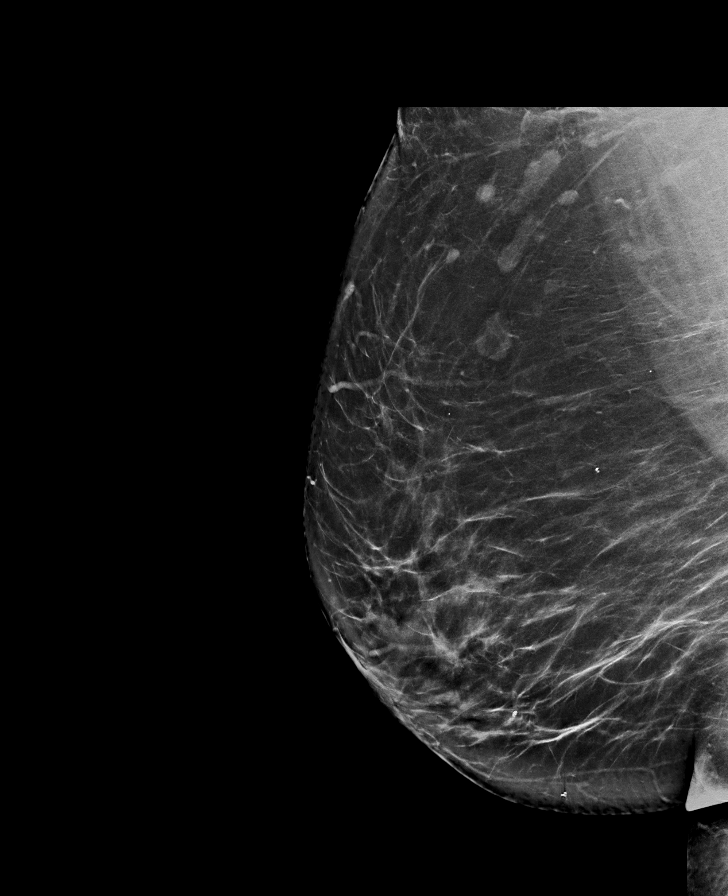

[R MLO]
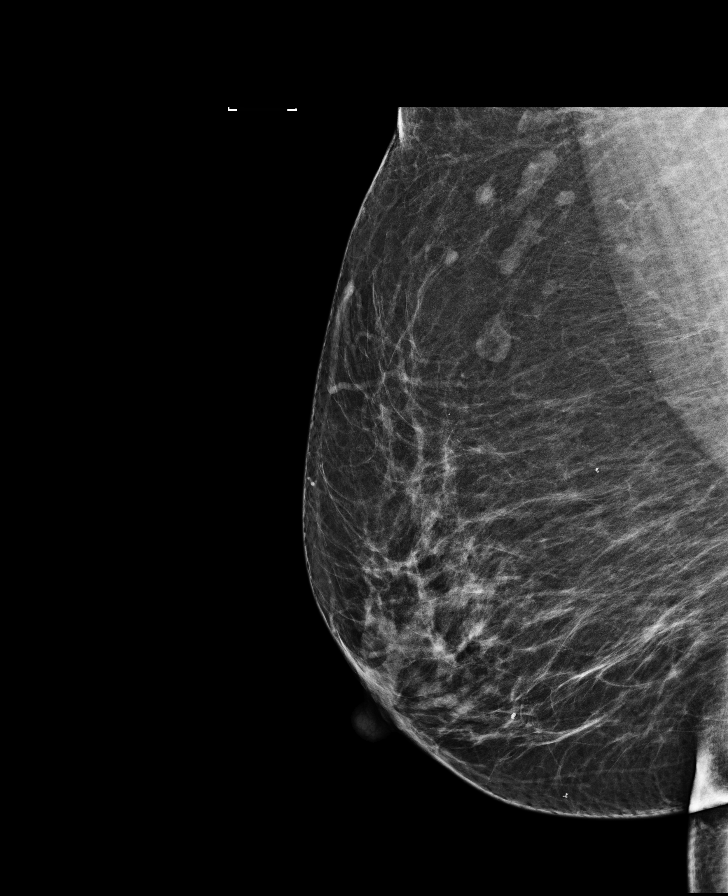

[L CC synth-2D]
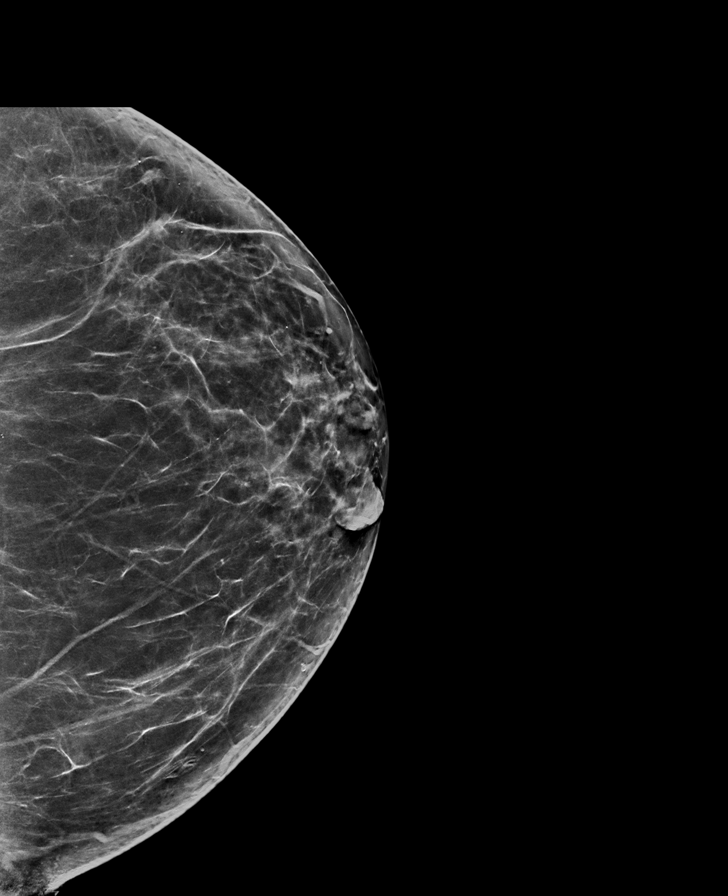

[R CC synth-2D]
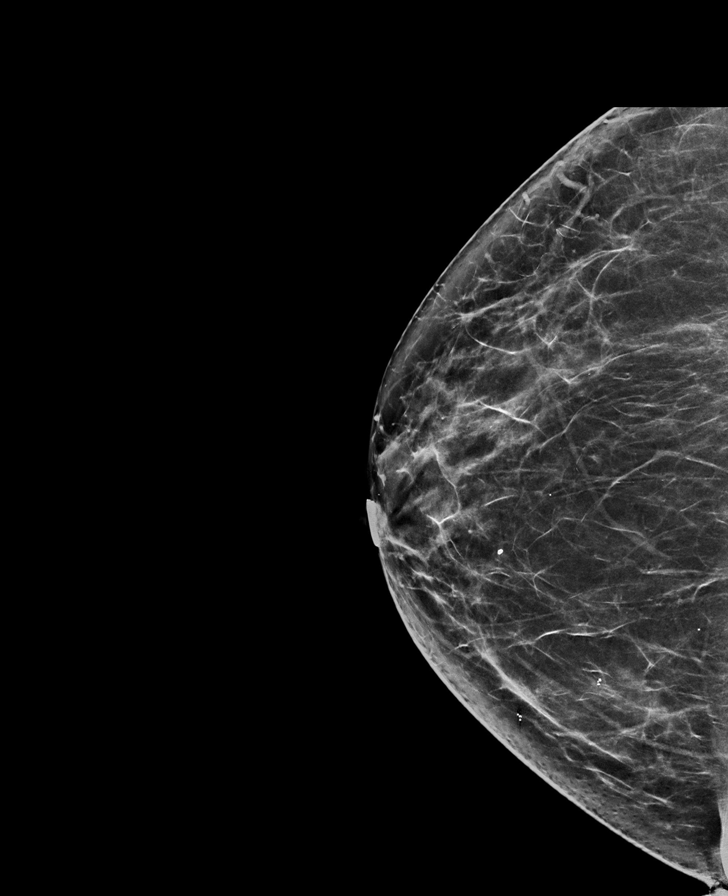

[L MLO synth-2D]
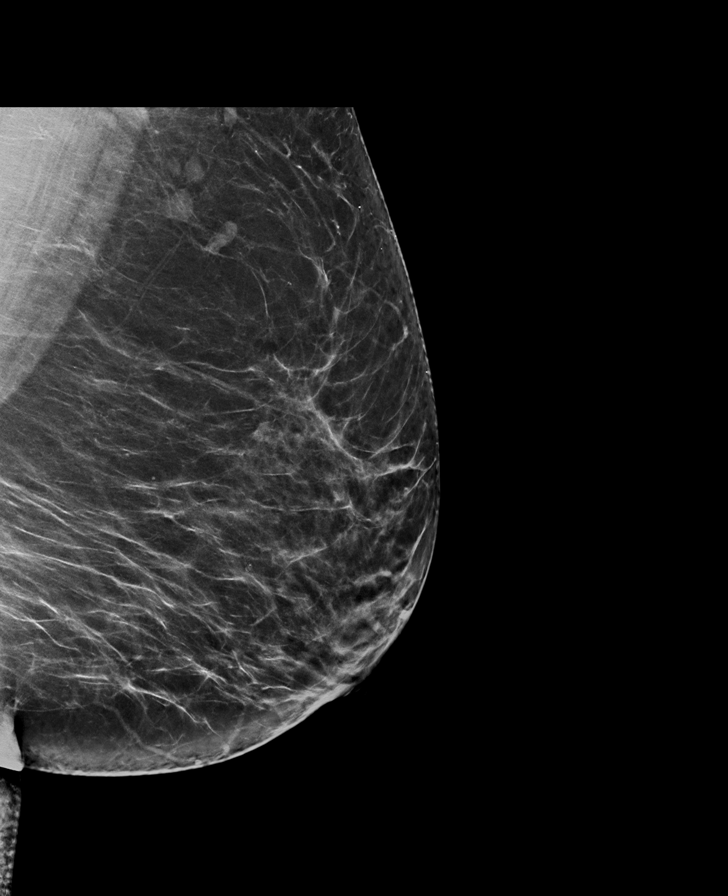

[L CC]
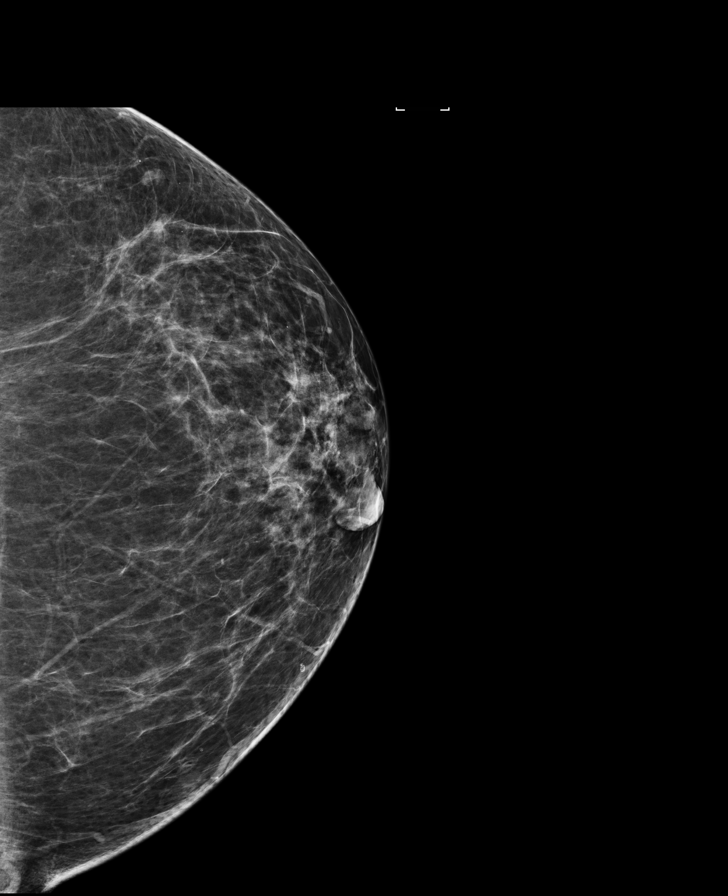

[R CC]
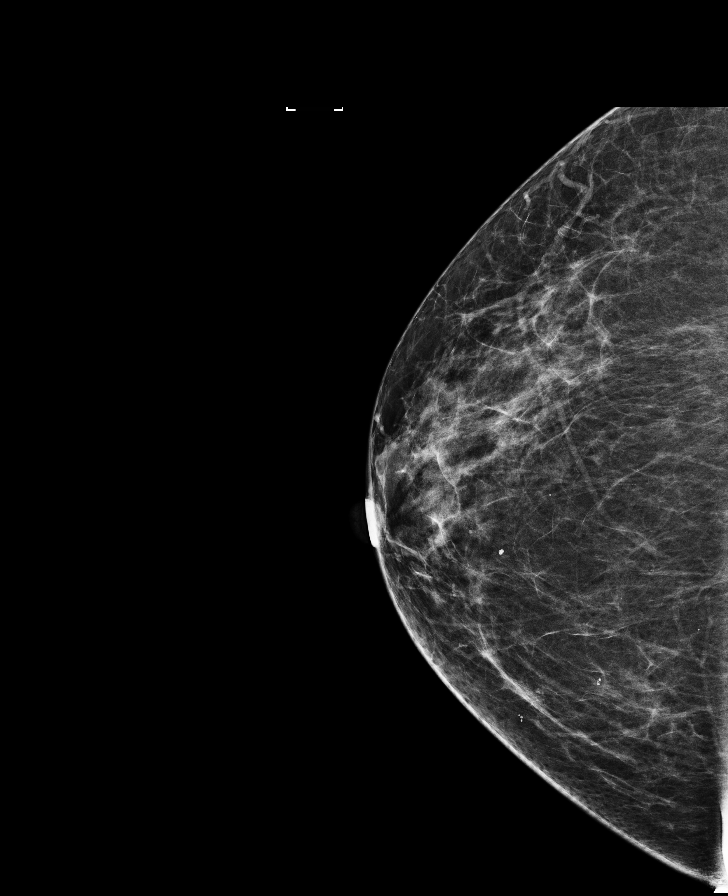

[8 of 28 positions shown; findings below may reference images not displayed]

ACR Breast Density Category b: There are scattered areas of
fibroglandular density.
FINDINGS: There are no findings suspicious for malignancy. Images were
processed with CAD.
IMPRESSION: No mammographic evidence of malignancy. A result letter of this
screening mammogram will be mailed directly to the patient.

RECOMMENDATION:
Screening mammogram in one year. (Code:97-6-RS4)

BI-RADS CATEGORY  1: Negative.

## 2017-06-13 DIAGNOSIS — M5137 Other intervertebral disc degeneration, lumbosacral region: Secondary | ICD-10-CM | POA: Diagnosis not present

## 2017-06-13 DIAGNOSIS — M961 Postlaminectomy syndrome, not elsewhere classified: Secondary | ICD-10-CM | POA: Diagnosis not present

## 2017-06-13 DIAGNOSIS — M5136 Other intervertebral disc degeneration, lumbar region: Secondary | ICD-10-CM | POA: Diagnosis not present

## 2017-06-13 DIAGNOSIS — Z6841 Body Mass Index (BMI) 40.0 and over, adult: Secondary | ICD-10-CM | POA: Diagnosis not present

## 2017-06-13 DIAGNOSIS — I10 Essential (primary) hypertension: Secondary | ICD-10-CM | POA: Diagnosis not present

## 2017-07-18 DIAGNOSIS — K5900 Constipation, unspecified: Secondary | ICD-10-CM | POA: Diagnosis not present

## 2017-07-18 DIAGNOSIS — M48061 Spinal stenosis, lumbar region without neurogenic claudication: Secondary | ICD-10-CM | POA: Diagnosis not present

## 2017-07-18 DIAGNOSIS — N3010 Interstitial cystitis (chronic) without hematuria: Secondary | ICD-10-CM | POA: Diagnosis not present

## 2017-07-18 DIAGNOSIS — Z23 Encounter for immunization: Secondary | ICD-10-CM | POA: Diagnosis not present

## 2017-07-18 DIAGNOSIS — F332 Major depressive disorder, recurrent severe without psychotic features: Secondary | ICD-10-CM | POA: Diagnosis not present

## 2017-07-18 DIAGNOSIS — G8929 Other chronic pain: Secondary | ICD-10-CM | POA: Diagnosis not present

## 2017-07-18 DIAGNOSIS — M797 Fibromyalgia: Secondary | ICD-10-CM | POA: Diagnosis not present

## 2017-07-18 DIAGNOSIS — R109 Unspecified abdominal pain: Secondary | ICD-10-CM | POA: Diagnosis not present

## 2017-08-19 DIAGNOSIS — M48061 Spinal stenosis, lumbar region without neurogenic claudication: Secondary | ICD-10-CM | POA: Diagnosis not present

## 2017-08-19 DIAGNOSIS — I10 Essential (primary) hypertension: Secondary | ICD-10-CM | POA: Diagnosis not present

## 2017-08-19 DIAGNOSIS — M542 Cervicalgia: Secondary | ICD-10-CM | POA: Diagnosis not present

## 2017-08-26 DIAGNOSIS — M545 Low back pain: Secondary | ICD-10-CM | POA: Diagnosis not present

## 2017-08-26 DIAGNOSIS — M79605 Pain in left leg: Secondary | ICD-10-CM | POA: Diagnosis not present

## 2017-08-26 DIAGNOSIS — M79604 Pain in right leg: Secondary | ICD-10-CM | POA: Diagnosis not present

## 2017-08-26 DIAGNOSIS — M6281 Muscle weakness (generalized): Secondary | ICD-10-CM | POA: Diagnosis not present

## 2017-08-28 DIAGNOSIS — M6281 Muscle weakness (generalized): Secondary | ICD-10-CM | POA: Diagnosis not present

## 2017-08-28 DIAGNOSIS — M79604 Pain in right leg: Secondary | ICD-10-CM | POA: Diagnosis not present

## 2017-08-28 DIAGNOSIS — M79605 Pain in left leg: Secondary | ICD-10-CM | POA: Diagnosis not present

## 2017-08-28 DIAGNOSIS — M545 Low back pain: Secondary | ICD-10-CM | POA: Diagnosis not present

## 2017-09-09 DIAGNOSIS — M545 Low back pain: Secondary | ICD-10-CM | POA: Diagnosis not present

## 2017-09-09 DIAGNOSIS — M79604 Pain in right leg: Secondary | ICD-10-CM | POA: Diagnosis not present

## 2017-09-09 DIAGNOSIS — M79605 Pain in left leg: Secondary | ICD-10-CM | POA: Diagnosis not present

## 2017-09-09 DIAGNOSIS — M6281 Muscle weakness (generalized): Secondary | ICD-10-CM | POA: Diagnosis not present

## 2017-09-11 DIAGNOSIS — M6281 Muscle weakness (generalized): Secondary | ICD-10-CM | POA: Diagnosis not present

## 2017-09-11 DIAGNOSIS — M79605 Pain in left leg: Secondary | ICD-10-CM | POA: Diagnosis not present

## 2017-09-11 DIAGNOSIS — M545 Low back pain: Secondary | ICD-10-CM | POA: Diagnosis not present

## 2017-09-11 DIAGNOSIS — M79604 Pain in right leg: Secondary | ICD-10-CM | POA: Diagnosis not present

## 2017-09-18 DIAGNOSIS — M6281 Muscle weakness (generalized): Secondary | ICD-10-CM | POA: Diagnosis not present

## 2017-09-18 DIAGNOSIS — M79605 Pain in left leg: Secondary | ICD-10-CM | POA: Diagnosis not present

## 2017-09-18 DIAGNOSIS — M79604 Pain in right leg: Secondary | ICD-10-CM | POA: Diagnosis not present

## 2017-09-18 DIAGNOSIS — M545 Low back pain: Secondary | ICD-10-CM | POA: Diagnosis not present

## 2017-09-19 ENCOUNTER — Other Ambulatory Visit: Payer: Self-pay | Admitting: Neurosurgery

## 2017-09-19 DIAGNOSIS — M961 Postlaminectomy syndrome, not elsewhere classified: Secondary | ICD-10-CM | POA: Diagnosis not present

## 2017-09-19 DIAGNOSIS — S32009K Unspecified fracture of unspecified lumbar vertebra, subsequent encounter for fracture with nonunion: Secondary | ICD-10-CM | POA: Diagnosis not present

## 2017-09-20 DIAGNOSIS — M79604 Pain in right leg: Secondary | ICD-10-CM | POA: Diagnosis not present

## 2017-09-20 DIAGNOSIS — M6281 Muscle weakness (generalized): Secondary | ICD-10-CM | POA: Diagnosis not present

## 2017-09-20 DIAGNOSIS — M79605 Pain in left leg: Secondary | ICD-10-CM | POA: Diagnosis not present

## 2017-09-20 DIAGNOSIS — M545 Low back pain: Secondary | ICD-10-CM | POA: Diagnosis not present

## 2017-09-23 DIAGNOSIS — M545 Low back pain: Secondary | ICD-10-CM | POA: Diagnosis not present

## 2017-09-23 DIAGNOSIS — M6281 Muscle weakness (generalized): Secondary | ICD-10-CM | POA: Diagnosis not present

## 2017-09-23 DIAGNOSIS — M79605 Pain in left leg: Secondary | ICD-10-CM | POA: Diagnosis not present

## 2017-09-23 DIAGNOSIS — M79604 Pain in right leg: Secondary | ICD-10-CM | POA: Diagnosis not present

## 2017-09-25 ENCOUNTER — Ambulatory Visit
Admission: RE | Admit: 2017-09-25 | Discharge: 2017-09-25 | Disposition: A | Discharge status: Home / Self Care | Payer: Medicare Other | Source: Ambulatory Visit | Attending: Neurosurgery | Admitting: Neurosurgery

## 2017-09-25 DIAGNOSIS — M47816 Spondylosis without myelopathy or radiculopathy, lumbar region: Secondary | ICD-10-CM | POA: Diagnosis not present

## 2017-09-25 DIAGNOSIS — S32009K Unspecified fracture of unspecified lumbar vertebra, subsequent encounter for fracture with nonunion: Secondary | ICD-10-CM

## 2017-09-25 DIAGNOSIS — M5124 Other intervertebral disc displacement, thoracic region: Secondary | ICD-10-CM | POA: Diagnosis not present

## 2017-09-26 DIAGNOSIS — M79605 Pain in left leg: Secondary | ICD-10-CM | POA: Diagnosis not present

## 2017-09-26 DIAGNOSIS — M6281 Muscle weakness (generalized): Secondary | ICD-10-CM | POA: Diagnosis not present

## 2017-09-26 DIAGNOSIS — M545 Low back pain: Secondary | ICD-10-CM | POA: Diagnosis not present

## 2017-09-26 DIAGNOSIS — M79604 Pain in right leg: Secondary | ICD-10-CM | POA: Diagnosis not present

## 2017-10-01 DIAGNOSIS — M545 Low back pain: Secondary | ICD-10-CM | POA: Diagnosis not present

## 2017-10-01 DIAGNOSIS — M6281 Muscle weakness (generalized): Secondary | ICD-10-CM | POA: Diagnosis not present

## 2017-10-01 DIAGNOSIS — M79605 Pain in left leg: Secondary | ICD-10-CM | POA: Diagnosis not present

## 2017-10-01 DIAGNOSIS — M79604 Pain in right leg: Secondary | ICD-10-CM | POA: Diagnosis not present

## 2017-10-03 ENCOUNTER — Other Ambulatory Visit: Payer: Self-pay | Admitting: Neurosurgery

## 2017-10-03 DIAGNOSIS — M5440 Lumbago with sciatica, unspecified side: Secondary | ICD-10-CM | POA: Diagnosis not present

## 2017-10-18 NOTE — Pre-Procedure Instructions (Signed)
Amy [name]  10/18/2017      [facility] [name], [place] [address] [place] [address] Phone: [phone] Fax: [phone]    Your procedure is scheduled on Wed. Feb.6  Report to [facility] Admitting at 6:30  A.M.  Call this number if you have problems the morning of surgery:  [phone]   Remember:  Do not eat food or drink liquids after midnight on Tues. Feb.5   Take these medicines the morning of surgery with A SIP OF WATER : tylenol if needed,clonidine (catapres), flonase if needed, gabapenten (neurontin),hydralazine (apresoline), metoprolol (lopressor), nifedipine (procardia) zofran if needed, paroxetine (paxil), tapentadol (nucynta) if needed,               7 days prior to surgery STOP taking any Aspirin(unless otherwise instructed by your surgeon), Aleve, Naproxen, Ibuprofen, Motrin, Advil, Goody's, BC's, all herbal medications, fish oil, and all vitamins   Do not wear jewelry, make-up or nail polish.  Do not wear lotions, powders, or perfumes, or deodorant.  Do not shave 48 hours prior to surgery.  Men may shave face and neck.  Do not bring valuables to the hospital.  [facility] is not responsible for any belongings or valuables.  Contacts, dentures or bridgework may not be worn into surgery.  Leave your suitcase in the car.  After surgery it may be brought to your room.  For patients admitted to the hospital, discharge time will be determined by your treatment team.  Patients discharged the day of surgery will not be allowed to drive home.    Special instructions:  - Preparing For Surgery  Before surgery, you can play an important role. Because skin is not sterile, your skin needs to be as free of germs as possible. You can reduce the number of germs on your skin by washing with CHG (chlorahexidine gluconate) Soap before surgery.  CHG is an  antiseptic cleaner which kills germs and bonds with the skin to continue killing germs even after washing.  Please do not use if you have an allergy to CHG or antibacterial soaps. If your skin becomes reddened/irritated stop using the CHG.  Do not shave (including legs and underarms) for at least 48 hours prior to first CHG shower. It is OK to shave your face.  Please follow these instructions carefully.   1. Shower the NIGHT BEFORE SURGERY and the MORNING OF SURGERY with CHG.   2. If you chose to wash your hair, wash your hair first as usual with your normal shampoo.  3. After you shampoo, rinse your hair and body thoroughly to remove the shampoo.  4. Use CHG as you would any other liquid soap. You can apply CHG directly to the skin and wash gently with a scrungie or a clean washcloth.   5. Apply the CHG Soap to your body ONLY FROM THE NECK DOWN.  Do not use on open wounds or open sores. Avoid contact with your eyes, ears, mouth and genitals (private parts). Wash Face and genitals (private parts)  with your normal soap.  6. Wash thoroughly, paying special attention to the area where your surgery will be performed.  7. Thoroughly rinse your body with warm water from the neck down.  8. DO NOT shower/wash with your normal soap after using and rinsing off the CHG Soap.  9. Pat yourself dry with a  CLEAN TOWEL.  10. Wear CLEAN PAJAMAS to bed the night before surgery, wear comfortable clothes the morning of surgery  11. Place CLEAN SHEETS on your bed the night of your first shower and DO NOT SLEEP WITH PETS.    Day of Surgery: Do not apply any deodorants/lotions. Please wear clean clothes to the hospital/surgery center.      Please read over the following fact sheets that you were given. Coughing and Deep Breathing, MRSA Information and Surgical Site Infection Prevention

## 2017-10-21 ENCOUNTER — Encounter (HOSPITAL_COMMUNITY): Payer: Self-pay | Admitting: Emergency Medicine

## 2017-10-21 ENCOUNTER — Other Ambulatory Visit: Payer: Self-pay

## 2017-10-21 ENCOUNTER — Encounter (HOSPITAL_COMMUNITY): Payer: Self-pay

## 2017-10-21 ENCOUNTER — Ambulatory Visit (HOSPITAL_COMMUNITY)
Admission: RE | Admit: 2017-10-21 | Discharge: 2017-10-21 | Disposition: A | Discharge status: Home / Self Care | Payer: Medicare Other | Source: Ambulatory Visit | Attending: Emergency Medicine | Admitting: Emergency Medicine

## 2017-10-21 ENCOUNTER — Encounter (HOSPITAL_COMMUNITY)
Admission: RE | Admit: 2017-10-21 | Discharge: 2017-10-21 | Disposition: A | Discharge status: Home / Self Care | Payer: Medicare Other | Source: Ambulatory Visit | Attending: Neurosurgery | Admitting: Neurosurgery

## 2017-10-21 DIAGNOSIS — Z01810 Encounter for preprocedural cardiovascular examination: Secondary | ICD-10-CM | POA: Diagnosis not present

## 2017-10-21 DIAGNOSIS — I700 Atherosclerosis of aorta: Secondary | ICD-10-CM | POA: Insufficient documentation

## 2017-10-21 DIAGNOSIS — Z87891 Personal history of nicotine dependence: Secondary | ICD-10-CM | POA: Diagnosis not present

## 2017-10-21 DIAGNOSIS — Z01818 Encounter for other preprocedural examination: Secondary | ICD-10-CM | POA: Insufficient documentation

## 2017-10-21 DIAGNOSIS — I739 Peripheral vascular disease, unspecified: Secondary | ICD-10-CM | POA: Diagnosis not present

## 2017-10-21 DIAGNOSIS — J45909 Unspecified asthma, uncomplicated: Secondary | ICD-10-CM | POA: Insufficient documentation

## 2017-10-21 DIAGNOSIS — G4733 Obstructive sleep apnea (adult) (pediatric): Secondary | ICD-10-CM | POA: Insufficient documentation

## 2017-10-21 DIAGNOSIS — X58XXXD Exposure to other specified factors, subsequent encounter: Secondary | ICD-10-CM | POA: Insufficient documentation

## 2017-10-21 DIAGNOSIS — Z79899 Other long term (current) drug therapy: Secondary | ICD-10-CM | POA: Diagnosis not present

## 2017-10-21 DIAGNOSIS — I10 Essential (primary) hypertension: Secondary | ICD-10-CM | POA: Insufficient documentation

## 2017-10-21 DIAGNOSIS — S32009K Unspecified fracture of unspecified lumbar vertebra, subsequent encounter for fracture with nonunion: Secondary | ICD-10-CM | POA: Insufficient documentation

## 2017-10-21 DIAGNOSIS — R079 Chest pain, unspecified: Secondary | ICD-10-CM | POA: Diagnosis not present

## 2017-10-21 HISTORY — DX: Other complications of anesthesia, initial encounter: T88.59XA

## 2017-10-21 HISTORY — DX: Low back pain, unspecified: M54.50

## 2017-10-21 HISTORY — DX: Adverse effect of unspecified anesthetic, initial encounter: T41.45XA

## 2017-10-21 HISTORY — DX: Low back pain: M54.5

## 2017-10-21 LAB — CBC
HCT: 41.7 % (ref 36.0–46.0)
Hemoglobin: 13.8 g/dL (ref 12.0–15.0)
MCH: 30.2 pg (ref 26.0–34.0)
MCHC: 33.1 g/dL (ref 30.0–36.0)
MCV: 91.2 fL (ref 78.0–100.0)
Platelets: 290 10*3/uL (ref 150–400)
RBC: 4.57 MIL/uL (ref 3.87–5.11)
RDW: 14.6 % (ref 11.5–15.5)
WBC: 4.9 10*3/uL (ref 4.0–10.5)

## 2017-10-21 LAB — BASIC METABOLIC PANEL
Anion gap: 11 (ref 5–15)
BUN: 19 mg/dL (ref 6–20)
CO2: 25 mmol/L (ref 22–32)
Calcium: 9.2 mg/dL (ref 8.9–10.3)
Chloride: 104 mmol/L (ref 101–111)
Creatinine, Ser: 1.01 mg/dL — ABNORMAL HIGH (ref 0.44–1.00)
GFR calc Af Amer: >60 mL/min (ref >60)
GFR calc non Af Amer: 55 mL/min — ABNORMAL LOW (ref >60)
Glucose, Bld: 108 mg/dL — ABNORMAL HIGH (ref 65–99)
Potassium: 4 mmol/L (ref 3.5–5.1)
Sodium: 140 mmol/L (ref 135–145)

## 2017-10-21 LAB — SURGICAL PCR SCREEN
MRSA, PCR: NEGATIVE
Staphylococcus aureus: POSITIVE — AB

## 2017-10-21 LAB — TYPE AND SCREEN
ABO/RH(D): B POS
Antibody Screen: NEGATIVE

## 2017-10-21 NOTE — Progress Notes (Signed)
Spoke with Arbie Cookey from Dr. Quin Hoop office regarding the pt c/o chest pain and sob, an appointment has been made for Wed 1/30 at 11:45am to evaluate further. Pt made aware.

## 2017-10-21 NOTE — Progress Notes (Signed)
PCP - Dr. Kelton Pillar  Cardiologist - Denies  Chest x-ray - 10/21/17  EKG - 10/21/17  Stress Test - >10 yrs  ECHO - 07/08/16 (E)  Cardiac Cath - Denies  Sleep Study - Yes- Positive CPAP - No- Pt sts she can not tolerate the mask, therefore she uses nothing at bedtime.  LABS- 10/21/17: CBC, BMP, T/S   Pt sts she has been having chest discomfort and sob for over a week following an incident at home, causing her BP to go over 200. Pt sts the incident made her very upset, and she still is upset. Levada Dy NP from anesthesia made aware. New orders received.  Anesthesia- Yes. Chest pain complaint.  Pt denies having chest pain, sob, or fever at this time. All instructions explained to the pt, with a verbal understanding of the material. Pt agrees to go over the instructions while at home for a better understanding. The opportunity to ask questions was provided.

## 2017-10-21 NOTE — Progress Notes (Addendum)
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 Former smoker. BMI 45.  - S/p exploratory laparotomy, LOA 07/06/16. S/p redo lumbar fusion 01/02/16. S/p PLIF 09/28/15. S/p redo lumbar fusion 07/27/13.   Medications include: Clonidine, hydralazine, metoprolol, nifedipine  BP (!) 178/81 Comment: patient states that she doesn't remember taking her bp meds  Pulse 73   Temp 37.1 C   Resp 20   Ht 5' 5.5" (1.664 m)   Wt 274 lb 12.8 oz (124.6 kg)   SpO2 95%   BMI 45.03 kg/m   Preoperative labs reviewed.    CXR 10/21/17:  - No active cardiopulmonary disease. - Aortic Atherosclerosis   EKG 10/21/17: NSR  Echo 07/08/16:  - Procedure narrative: Transthoracic echocardiography. The study was technically difficult, as a result of poor sound wave transmission and body habitus. Intravenous contrast (Definity) was administered. - Left ventricle: The cavity size was normal. Systolic function was normal. The estimated ejection fraction was in the range of 60% to 65%. Wall motion was normal; there were no regional wall motion abnormalities. Doppler parameters are consistent with abnormal left ventricular relaxation (grade 1 diastolic dysfunction). Mild focal basal septal hypertrophy. - Mitral valve: Calcified annulus.   Called to pre-admission testing for pt c/o chest pain.  Started 1.5 weeks ago on 10/10/17 after an upsetting personal incident.  Location of pain is mid-upper sternal area. Pain is constant, mild, feels like pressure.  Has not stopped since it started, never waxes or wanes; is there when she goes to sleep, is there when she wakes up, is not exacerbated by activity and nothing relieves it.  Associated with an increase in SOB compared to usual. Pt also c/o sinus pressure, non-productive  cough, and nasal discharge (sometimes clear, sometimes yellow).  Denies fever or chills.  HRRR. Lungs CTA B.  Chest tender to palpation in area of complaint, but pt reports the chest pressure she feels is different than the tenderness to palpation. No peripheral edema. EKG normal, CXR without acute findings. Pt does not appear to be in acute distress.  I think given timing, duration, and severity of symptoms this is unlikely to be cardiac.    Discussed with Dr. Gifford Shave. Pt will need medical clearance prior to surgery.  Pt has appointment with Dr. Laurann Montana 10/23/17 at 11:45am.   Willeen Cass, FNP-BC [facility] Stay [facility]/Anesthesiology Phone: [phone] 10/21/2017 4:18 PM   Addendum 10/25/17:   Pt was evaluated by PCP 10/24/17 for chest pain, determined to be chest wall pain.  Pt cleared to proceed with surgery.   If no changes, I anticipate pt can proceed with surgery as scheduled.   Willeen Cass, FNP-BC [facility]/Anesthesiology Phone: [phone] 10/25/2017 4:18 PM

## 2017-10-21 NOTE — Progress Notes (Signed)
Pt made aware of Positive Staph result. Prescription called in the [place].

## 2017-10-23 DIAGNOSIS — R232 Flushing: Secondary | ICD-10-CM | POA: Diagnosis not present

## 2017-10-23 DIAGNOSIS — R079 Chest pain, unspecified: Secondary | ICD-10-CM | POA: Diagnosis not present

## 2017-10-23 DIAGNOSIS — I10 Essential (primary) hypertension: Secondary | ICD-10-CM | POA: Diagnosis not present

## 2017-10-29 MED ORDER — DEXAMETHASONE SODIUM PHOSPHATE 10 MG/ML IJ SOLN: 10.0000 mg | INTRAMUSCULAR | Status: DC

## 2017-10-29 MED ORDER — VANCOMYCIN HCL 10 G IV SOLR
1500.0000 mg | INTRAVENOUS | Status: DC
  Filled 2017-10-29: qty 1500

## 2017-10-30 ENCOUNTER — Inpatient Hospital Stay (HOSPITAL_COMMUNITY): Admission: RE | Admit: 2017-10-30 | Payer: Medicare Other | Source: Ambulatory Visit | Admitting: Neurosurgery

## 2017-10-30 ENCOUNTER — Encounter (HOSPITAL_COMMUNITY): Admission: RE | Payer: Self-pay | Source: Ambulatory Visit

## 2017-10-30 SURGERY — LAMINECTOMY WITH POSTERIOR LATERAL ARTHRODESIS LEVEL 3
Anesthesia: General

## 2017-11-12 DIAGNOSIS — R1031 Right lower quadrant pain: Secondary | ICD-10-CM | POA: Diagnosis not present

## 2017-11-12 DIAGNOSIS — N952 Postmenopausal atrophic vaginitis: Secondary | ICD-10-CM | POA: Diagnosis not present

## 2017-11-12 DIAGNOSIS — M5440 Lumbago with sciatica, unspecified side: Secondary | ICD-10-CM | POA: Diagnosis not present

## 2017-11-12 DIAGNOSIS — R140 Abdominal distension (gaseous): Secondary | ICD-10-CM | POA: Diagnosis not present

## 2017-11-12 DIAGNOSIS — Z6841 Body Mass Index (BMI) 40.0 and over, adult: Secondary | ICD-10-CM | POA: Diagnosis not present

## 2017-11-12 DIAGNOSIS — Z638 Other specified problems related to primary support group: Secondary | ICD-10-CM | POA: Diagnosis not present

## 2017-11-12 DIAGNOSIS — Z7251 High risk heterosexual behavior: Secondary | ICD-10-CM | POA: Diagnosis not present

## 2017-11-12 DIAGNOSIS — I10 Essential (primary) hypertension: Secondary | ICD-10-CM | POA: Diagnosis not present

## 2017-11-12 DIAGNOSIS — Z9189 Other specified personal risk factors, not elsewhere classified: Secondary | ICD-10-CM | POA: Diagnosis not present

## 2017-11-13 ENCOUNTER — Other Ambulatory Visit: Payer: Self-pay | Admitting: Family Medicine

## 2017-11-13 ENCOUNTER — Other Ambulatory Visit: Payer: Self-pay | Admitting: Obstetrics and Gynecology

## 2017-11-13 DIAGNOSIS — Z1231 Encounter for screening mammogram for malignant neoplasm of breast: Secondary | ICD-10-CM

## 2017-11-25 DIAGNOSIS — R1031 Right lower quadrant pain: Secondary | ICD-10-CM | POA: Diagnosis not present

## 2018-01-08 ENCOUNTER — Ambulatory Visit
Admission: RE | Admit: 2018-01-08 | Discharge: 2018-01-08 | Disposition: A | Discharge status: Home / Self Care | Payer: Medicare Other | Source: Ambulatory Visit | Attending: Obstetrics and Gynecology | Admitting: Obstetrics and Gynecology

## 2018-01-08 DIAGNOSIS — Z1231 Encounter for screening mammogram for malignant neoplasm of breast: Secondary | ICD-10-CM | POA: Diagnosis not present

## 2018-01-15 ENCOUNTER — Other Ambulatory Visit: Payer: Self-pay | Admitting: Neurosurgery

## 2018-02-03 NOTE — Pre-Procedure Instructions (Signed)
Shakthi Scipio Lyles-Williams  02/03/2018      [facility] [name], [place] [address] Phone: [phone] Fax: [phone]    Your procedure is scheduled on Mon. May 20  Report to [facility] Admitting at 10:00 A.M.  Call this number if you have problems the morning of surgery:  [phone]   Remember:  Do not eat food or drink liquids after midnight on Sun. May 19   Take these medicines the morning of surgery with A SIP OF WATER : tylenol if needed, clonidine (catapres),flonase nasal spray if needed,gabapentin (neurontin), hydralazine (apresoline), metoprolol (lopressor), nifedipine (procardia), zofran if needed, pantoprazole (protonix),paroxetine (paxil), eye drops, tapentadol (nucynta)   Do not wear jewelry, make-up or nail polish.  Do not wear lotions, powders, or perfumes, or deodorant.  Do not shave 48 hours prior to surgery.  Men may shave face and neck.  Do not bring valuables to the hospital.  [facility] is not responsible for any belongings or valuables.  Contacts, dentures or bridgework may not be worn into surgery.  Leave your suitcase in the car.  After surgery it may be brought to your room.  For patients admitted to the hospital, discharge time will be determined by your treatment team.  Patients discharged the day of surgery will not be allowed to drive home.    Special instructions:  [place]- Preparing For Surgery  Before surgery, you can play an important role. Because skin is not sterile, your skin needs to be as free of germs as possible. You can reduce the number of germs on your skin by washing with CHG (chlorahexidine gluconate) Soap before surgery.  CHG is an antiseptic cleaner which kills germs and bonds with the skin to continue killing germs even after washing.  Oral Hygiene is also important to reduce your risk of infection.  Remember  - BRUSH YOUR TEETH THE MORNING OF SURGERY  Please do not use if you have an allergy to CHG or antibacterial soaps. If your skin becomes reddened/irritated stop using the CHG.  Do not shave (including legs and underarms) for at least 48 hours prior to first CHG shower. It is OK to shave your face.  Please follow these instructions carefully.   1. Shower the NIGHT BEFORE SURGERY and the MORNING OF SURGERY with CHG.   2. If you chose to wash your hair, wash your hair first as usual with your normal shampoo.  3. After you shampoo, rinse your hair and body thoroughly to remove the shampoo.  4. Use CHG as you would any other liquid soap. You can apply CHG directly to the skin and wash gently with a scrungie or a clean washcloth.   5. Apply the CHG Soap to your body ONLY FROM THE NECK DOWN.  Do not use on open wounds or open sores. Avoid contact with your eyes, ears, mouth and genitals (private parts). Wash Face and genitals (private parts)  with your normal soap.  6. Wash thoroughly, paying special attention to the area where your surgery will be performed.  7. Thoroughly rinse your body with warm water from the neck down.  8. DO NOT shower/wash with your normal soap after using and rinsing off the CHG Soap.  9. Pat yourself dry with a CLEAN TOWEL.  10. Wear CLEAN PAJAMAS to bed the night before surgery, wear comfortable clothes the  morning of surgery  11. Place CLEAN SHEETS on your bed the night of your first shower and DO NOT SLEEP WITH PETS.    Day of Surgery: Do not apply any deodorants/lotions. Please wear clean clothes to the hospital/surgery center.  Remember to brush your teeth.      Please read over the following fact sheets that you were given. Coughing and Deep Breathing, MRSA Information and Surgical Site Infection Prevention

## 2018-02-04 ENCOUNTER — Encounter (HOSPITAL_COMMUNITY)
Admission: RE | Admit: 2018-02-04 | Discharge: 2018-02-04 | Disposition: A | Discharge status: Home / Self Care | Payer: Medicare Other | Source: Ambulatory Visit | Attending: Neurosurgery | Admitting: Neurosurgery

## 2018-02-04 ENCOUNTER — Other Ambulatory Visit: Payer: Self-pay

## 2018-02-04 ENCOUNTER — Encounter (HOSPITAL_COMMUNITY): Payer: Self-pay

## 2018-02-04 DIAGNOSIS — Z01812 Encounter for preprocedural laboratory examination: Secondary | ICD-10-CM | POA: Diagnosis not present

## 2018-02-04 LAB — TYPE AND SCREEN
ABO/RH(D): B POS
Antibody Screen: NEGATIVE

## 2018-02-04 LAB — CBC
HCT: 42.2 % (ref 36.0–46.0)
Hemoglobin: 13.7 g/dL (ref 12.0–15.0)
MCH: 29.7 pg (ref 26.0–34.0)
MCHC: 32.5 g/dL (ref 30.0–36.0)
MCV: 91.3 fL (ref 78.0–100.0)
Platelets: 322 10*3/uL (ref 150–400)
RBC: 4.62 MIL/uL (ref 3.87–5.11)
RDW: 14.7 % (ref 11.5–15.5)
WBC: 3.9 10*3/uL — ABNORMAL LOW (ref 4.0–10.5)

## 2018-02-04 LAB — BASIC METABOLIC PANEL
Anion gap: 10 (ref 5–15)
BUN: 15 mg/dL (ref 6–20)
CO2: 28 mmol/L (ref 22–32)
Calcium: 9.1 mg/dL (ref 8.9–10.3)
Chloride: 102 mmol/L (ref 101–111)
Creatinine, Ser: 0.92 mg/dL (ref 0.44–1.00)
GFR calc Af Amer: >60 mL/min (ref >60)
GFR calc non Af Amer: >60 mL/min (ref >60)
Glucose, Bld: 113 mg/dL — ABNORMAL HIGH (ref 65–99)
Potassium: 3.4 mmol/L — ABNORMAL LOW (ref 3.5–5.1)
Sodium: 140 mmol/L (ref 135–145)

## 2018-02-04 LAB — SURGICAL PCR SCREEN
MRSA, PCR: NEGATIVE
Staphylococcus aureus: POSITIVE — AB

## 2018-02-04 NOTE — Progress Notes (Signed)
PCP - Dr. Kelton Pillar  Cardiologist - Denies  Chest x-ray - 10/21/17 (E)  EKG - 10/21/17 (E)  Stress Test - >10 yrs  ECHO - 07/08/16 (E)  Cardiac Cath - Denies  Sleep Study - Yes- Positive CPAP - None  LABS- 02/04/18: CBC, BMP, T/S  ASA- Denies  Pt has an appointment with Dr. Laurann Montana on 02/07/18.   Anesthesia- Yes- for review, previous consult.  Pt denies having chest pain, sob, or fever at this time. All instructions explained to the pt, with a verbal understanding of the material. Pt agrees to go over the instructions while at home for a better understanding. The opportunity to ask questions was provided.

## 2018-02-04 NOTE — Progress Notes (Signed)
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 Pt already has ointment, and will start using today.

## 2018-02-07 DIAGNOSIS — E7800 Pure hypercholesterolemia, unspecified: Secondary | ICD-10-CM | POA: Diagnosis not present

## 2018-02-07 DIAGNOSIS — K224 Dyskinesia of esophagus: Secondary | ICD-10-CM | POA: Diagnosis not present

## 2018-02-07 DIAGNOSIS — K219 Gastro-esophageal reflux disease without esophagitis: Secondary | ICD-10-CM | POA: Diagnosis not present

## 2018-02-07 DIAGNOSIS — F39 Unspecified mood [affective] disorder: Secondary | ICD-10-CM | POA: Diagnosis not present

## 2018-02-07 DIAGNOSIS — J301 Allergic rhinitis due to pollen: Secondary | ICD-10-CM | POA: Diagnosis not present

## 2018-02-07 DIAGNOSIS — R7309 Other abnormal glucose: Secondary | ICD-10-CM | POA: Diagnosis not present

## 2018-02-07 DIAGNOSIS — G4730 Sleep apnea, unspecified: Secondary | ICD-10-CM | POA: Diagnosis not present

## 2018-02-07 DIAGNOSIS — Z131 Encounter for screening for diabetes mellitus: Secondary | ICD-10-CM | POA: Diagnosis not present

## 2018-02-07 DIAGNOSIS — Z0000 Encounter for general adult medical examination without abnormal findings: Secondary | ICD-10-CM | POA: Diagnosis not present

## 2018-02-07 DIAGNOSIS — M797 Fibromyalgia: Secondary | ICD-10-CM | POA: Diagnosis not present

## 2018-02-07 DIAGNOSIS — M48061 Spinal stenosis, lumbar region without neurogenic claudication: Secondary | ICD-10-CM | POA: Diagnosis not present

## 2018-02-07 DIAGNOSIS — R490 Dysphonia: Secondary | ICD-10-CM | POA: Diagnosis not present

## 2018-02-07 DIAGNOSIS — I10 Essential (primary) hypertension: Secondary | ICD-10-CM | POA: Diagnosis not present

## 2018-02-07 MED ORDER — VANCOMYCIN HCL 10 G IV SOLR
1500.0000 mg | INTRAVENOUS | Status: AC
  Administered 2018-02-10: 1500 mg via INTRAVENOUS
  Filled 2018-02-07: qty 1500

## 2018-02-10 ENCOUNTER — Inpatient Hospital Stay (HOSPITAL_COMMUNITY)
Admission: RE | Disposition: A | Discharge status: Skilled Nursing Facility | Payer: Self-pay | Source: Ambulatory Visit | Attending: Neurosurgery

## 2018-02-10 ENCOUNTER — Inpatient Hospital Stay (HOSPITAL_COMMUNITY): Payer: Medicare Other | Admitting: Emergency Medicine

## 2018-02-10 ENCOUNTER — Encounter (HOSPITAL_COMMUNITY): Payer: Self-pay | Admitting: *Deleted

## 2018-02-10 ENCOUNTER — Inpatient Hospital Stay (HOSPITAL_COMMUNITY): Payer: Medicare Other | Admitting: Anesthesiology

## 2018-02-10 ENCOUNTER — Inpatient Hospital Stay (HOSPITAL_COMMUNITY): Payer: Medicare Other

## 2018-02-10 ENCOUNTER — Other Ambulatory Visit: Payer: Self-pay

## 2018-02-10 ENCOUNTER — Inpatient Hospital Stay (HOSPITAL_COMMUNITY)
Admission: RE | Admit: 2018-02-10 | Discharge: 2018-02-18 | DRG: 460 | Disposition: A | Discharge status: Skilled Nursing Facility | Payer: Medicare Other | Source: Ambulatory Visit | Attending: Neurosurgery | Admitting: Neurosurgery

## 2018-02-10 DIAGNOSIS — H409 Unspecified glaucoma: Secondary | ICD-10-CM | POA: Diagnosis present

## 2018-02-10 DIAGNOSIS — I739 Peripheral vascular disease, unspecified: Secondary | ICD-10-CM | POA: Diagnosis present

## 2018-02-10 DIAGNOSIS — Z9841 Cataract extraction status, right eye: Secondary | ICD-10-CM

## 2018-02-10 DIAGNOSIS — K219 Gastro-esophageal reflux disease without esophagitis: Secondary | ICD-10-CM | POA: Diagnosis not present

## 2018-02-10 DIAGNOSIS — Z7951 Long term (current) use of inhaled steroids: Secondary | ICD-10-CM

## 2018-02-10 DIAGNOSIS — Z8614 Personal history of Methicillin resistant Staphylococcus aureus infection: Secondary | ICD-10-CM | POA: Diagnosis not present

## 2018-02-10 DIAGNOSIS — Z87891 Personal history of nicotine dependence: Secondary | ICD-10-CM

## 2018-02-10 DIAGNOSIS — Z6841 Body Mass Index (BMI) 40.0 and over, adult: Secondary | ICD-10-CM | POA: Diagnosis not present

## 2018-02-10 DIAGNOSIS — Z841 Family history of disorders of kidney and ureter: Secondary | ICD-10-CM | POA: Diagnosis not present

## 2018-02-10 DIAGNOSIS — M48061 Spinal stenosis, lumbar region without neurogenic claudication: Secondary | ICD-10-CM | POA: Diagnosis not present

## 2018-02-10 DIAGNOSIS — R402363 Coma scale, best motor response, obeys commands, at hospital admission: Secondary | ICD-10-CM | POA: Diagnosis present

## 2018-02-10 DIAGNOSIS — H04129 Dry eye syndrome of unspecified lacrimal gland: Secondary | ICD-10-CM | POA: Diagnosis not present

## 2018-02-10 DIAGNOSIS — M62830 Muscle spasm of back: Secondary | ICD-10-CM | POA: Diagnosis not present

## 2018-02-10 DIAGNOSIS — E861 Hypovolemia: Secondary | ICD-10-CM | POA: Diagnosis present

## 2018-02-10 DIAGNOSIS — T7840XD Allergy, unspecified, subsequent encounter: Secondary | ICD-10-CM | POA: Diagnosis not present

## 2018-02-10 DIAGNOSIS — R110 Nausea: Secondary | ICD-10-CM | POA: Diagnosis not present

## 2018-02-10 DIAGNOSIS — T84226A Displacement of internal fixation device of vertebrae, initial encounter: Secondary | ICD-10-CM | POA: Diagnosis present

## 2018-02-10 DIAGNOSIS — Z9842 Cataract extraction status, left eye: Secondary | ICD-10-CM

## 2018-02-10 DIAGNOSIS — Z9049 Acquired absence of other specified parts of digestive tract: Secondary | ICD-10-CM | POA: Diagnosis not present

## 2018-02-10 DIAGNOSIS — G4733 Obstructive sleep apnea (adult) (pediatric): Secondary | ICD-10-CM | POA: Diagnosis not present

## 2018-02-10 DIAGNOSIS — Z743 Need for continuous supervision: Secondary | ICD-10-CM | POA: Diagnosis not present

## 2018-02-10 DIAGNOSIS — S32009K Unspecified fracture of unspecified lumbar vertebra, subsequent encounter for fracture with nonunion: Secondary | ICD-10-CM | POA: Diagnosis present

## 2018-02-10 DIAGNOSIS — M960 Pseudarthrosis after fusion or arthrodesis: Principal | ICD-10-CM | POA: Diagnosis present

## 2018-02-10 DIAGNOSIS — M797 Fibromyalgia: Secondary | ICD-10-CM | POA: Diagnosis present

## 2018-02-10 DIAGNOSIS — Z91018 Allergy to other foods: Secondary | ICD-10-CM

## 2018-02-10 DIAGNOSIS — Z86718 Personal history of other venous thrombosis and embolism: Secondary | ICD-10-CM | POA: Diagnosis not present

## 2018-02-10 DIAGNOSIS — T7840XA Allergy, unspecified, initial encounter: Secondary | ICD-10-CM | POA: Diagnosis not present

## 2018-02-10 DIAGNOSIS — Z981 Arthrodesis status: Secondary | ICD-10-CM

## 2018-02-10 DIAGNOSIS — Z90710 Acquired absence of both cervix and uterus: Secondary | ICD-10-CM

## 2018-02-10 DIAGNOSIS — Z4789 Encounter for other orthopedic aftercare: Secondary | ICD-10-CM | POA: Diagnosis not present

## 2018-02-10 DIAGNOSIS — R402253 Coma scale, best verbal response, oriented, at hospital admission: Secondary | ICD-10-CM | POA: Diagnosis present

## 2018-02-10 DIAGNOSIS — Z961 Presence of intraocular lens: Secondary | ICD-10-CM | POA: Diagnosis present

## 2018-02-10 DIAGNOSIS — E669 Obesity, unspecified: Secondary | ICD-10-CM | POA: Diagnosis present

## 2018-02-10 DIAGNOSIS — R51 Headache: Secondary | ICD-10-CM | POA: Diagnosis not present

## 2018-02-10 DIAGNOSIS — J302 Other seasonal allergic rhinitis: Secondary | ICD-10-CM | POA: Diagnosis present

## 2018-02-10 DIAGNOSIS — Y792 Prosthetic and other implants, materials and accessory orthopedic devices associated with adverse incidents: Secondary | ICD-10-CM | POA: Diagnosis present

## 2018-02-10 DIAGNOSIS — Z91040 Latex allergy status: Secondary | ICD-10-CM

## 2018-02-10 DIAGNOSIS — K449 Diaphragmatic hernia without obstruction or gangrene: Secondary | ICD-10-CM | POA: Diagnosis not present

## 2018-02-10 DIAGNOSIS — R2689 Other abnormalities of gait and mobility: Secondary | ICD-10-CM | POA: Diagnosis not present

## 2018-02-10 DIAGNOSIS — G629 Polyneuropathy, unspecified: Secondary | ICD-10-CM | POA: Diagnosis present

## 2018-02-10 DIAGNOSIS — M6281 Muscle weakness (generalized): Secondary | ICD-10-CM | POA: Diagnosis not present

## 2018-02-10 DIAGNOSIS — Z8249 Family history of ischemic heart disease and other diseases of the circulatory system: Secondary | ICD-10-CM

## 2018-02-10 DIAGNOSIS — R402143 Coma scale, eyes open, spontaneous, at hospital admission: Secondary | ICD-10-CM | POA: Diagnosis present

## 2018-02-10 DIAGNOSIS — R279 Unspecified lack of coordination: Secondary | ICD-10-CM | POA: Diagnosis not present

## 2018-02-10 DIAGNOSIS — Z419 Encounter for procedure for purposes other than remedying health state, unspecified: Secondary | ICD-10-CM

## 2018-02-10 DIAGNOSIS — I10 Essential (primary) hypertension: Secondary | ICD-10-CM | POA: Diagnosis not present

## 2018-02-10 DIAGNOSIS — Z880 Allergy status to penicillin: Secondary | ICD-10-CM

## 2018-02-10 SURGERY — POSTERIOR LUMBAR FUSION 3 WITH HARDWARE REMOVAL
Anesthesia: General | Site: Back

## 2018-02-10 MED ORDER — ACETAMINOPHEN 650 MG RE SUPP: 650.0000 mg | RECTAL | Status: DC | PRN

## 2018-02-10 MED ORDER — ONDANSETRON HCL 4 MG PO TABS: 4.0000 mg | ORAL_TABLET | Freq: Four times a day (QID) | ORAL | Status: DC | PRN

## 2018-02-10 MED ORDER — VITAMIN B COMPLEX PO TABS: ORAL_TABLET | Freq: Every day | ORAL | Status: DC

## 2018-02-10 MED ORDER — SODIUM CHLORIDE 0.9% FLUSH
3.0000 mL | Freq: Two times a day (BID) | INTRAVENOUS | Status: DC
  Administered 2018-02-11 – 2018-02-18 (×4): 3 mL via INTRAVENOUS

## 2018-02-10 MED ORDER — HYDROMORPHONE HCL 2 MG/ML IJ SOLN
INTRAMUSCULAR | Status: AC
  Filled 2018-02-10: qty 1

## 2018-02-10 MED ORDER — MEPERIDINE HCL 50 MG/ML IJ SOLN: 6.2500 mg | INTRAMUSCULAR | Status: DC | PRN

## 2018-02-10 MED ORDER — AZELAIC ACID 20 % EX CREA: 1.0000 "application " | TOPICAL_CREAM | Freq: Two times a day (BID) | CUTANEOUS | Status: DC

## 2018-02-10 MED ORDER — THROMBIN 5000 UNITS EX SOLR
CUTANEOUS | Status: AC
Start: 2018-02-10 — End: ?
  Filled 2018-02-10: qty 5000

## 2018-02-10 MED ORDER — POLYVINYL ALCOHOL 1.4 % OP SOLN: 1.0000 [drp] | Freq: Two times a day (BID) | OPHTHALMIC | Status: DC | PRN

## 2018-02-10 MED ORDER — CHLORHEXIDINE GLUCONATE CLOTH 2 % EX PADS: 6.0000 | MEDICATED_PAD | Freq: Once | CUTANEOUS | Status: DC

## 2018-02-10 MED ORDER — LIDOCAINE-EPINEPHRINE 1 %-1:100000 IJ SOLN
INTRAMUSCULAR | Status: DC | PRN
  Administered 2018-02-10: 10 mL

## 2018-02-10 MED ORDER — CYCLOBENZAPRINE HCL 10 MG PO TABS
10.0000 mg | ORAL_TABLET | Freq: Three times a day (TID) | ORAL | Status: DC | PRN
  Administered 2018-02-10 – 2018-02-18 (×14): 10 mg via ORAL
  Filled 2018-02-10 (×15): qty 1

## 2018-02-10 MED ORDER — PANTOPRAZOLE SODIUM 40 MG PO TBEC: 80.0000 mg | DELAYED_RELEASE_TABLET | Freq: Every day | ORAL | Status: DC

## 2018-02-10 MED ORDER — LACTATED RINGERS IV SOLN
INTRAVENOUS | Status: DC
  Administered 2018-02-10 (×2): via INTRAVENOUS

## 2018-02-10 MED ORDER — ALUM & MAG HYDROXIDE-SIMETH 200-200-20 MG/5ML PO SUSP: 30.0000 mL | Freq: Four times a day (QID) | ORAL | Status: DC | PRN

## 2018-02-10 MED ORDER — FENTANYL CITRATE (PF) 250 MCG/5ML IJ SOLN
INTRAMUSCULAR | Status: AC
  Filled 2018-02-10: qty 5

## 2018-02-10 MED ORDER — METOPROLOL TARTRATE 50 MG PO TABS
ORAL_TABLET | ORAL | Status: AC
  Filled 2018-02-10: qty 1

## 2018-02-10 MED ORDER — HYDROMORPHONE HCL 2 MG/ML IJ SOLN
0.2500 mg | INTRAMUSCULAR | Status: DC | PRN
  Administered 2018-02-10 (×4): 0.5 mg via INTRAVENOUS

## 2018-02-10 MED ORDER — VANCOMYCIN HCL 1 G IV SOLR
INTRAVENOUS | Status: DC | PRN
Start: 2018-02-10 — End: 2018-02-10
  Administered 2018-02-10: 1000 mg

## 2018-02-10 MED ORDER — PANTOPRAZOLE SODIUM 40 MG PO TBEC
40.0000 mg | DELAYED_RELEASE_TABLET | Freq: Every day | ORAL | Status: DC
  Administered 2018-02-11 – 2018-02-18 (×8): 40 mg via ORAL
  Filled 2018-02-10 (×8): qty 1

## 2018-02-10 MED ORDER — SUGAMMADEX SODIUM 500 MG/5ML IV SOLN
INTRAVENOUS | Status: DC | PRN
  Administered 2018-02-10: 300 mg via INTRAVENOUS

## 2018-02-10 MED ORDER — PAROXETINE HCL 30 MG PO TABS
30.0000 mg | ORAL_TABLET | Freq: Every day | ORAL | Status: DC
  Administered 2018-02-11 – 2018-02-18 (×8): 30 mg via ORAL
  Filled 2018-02-10 (×8): qty 1

## 2018-02-10 MED ORDER — RISAQUAD PO CAPS
ORAL_CAPSULE | Freq: Every day | ORAL | Status: DC
  Administered 2018-02-11 – 2018-02-18 (×8): 1 via ORAL
  Filled 2018-02-10 (×8): qty 1

## 2018-02-10 MED ORDER — METOPROLOL TARTRATE 50 MG PO TABS
50.0000 mg | ORAL_TABLET | Freq: Once | ORAL | Status: AC
  Administered 2018-02-10: 50 mg via ORAL

## 2018-02-10 MED ORDER — BUPIVACAINE LIPOSOME 1.3 % IJ SUSP
20.0000 mL | INTRAMUSCULAR | Status: DC
  Filled 2018-02-10: qty 20

## 2018-02-10 MED ORDER — ACETAMINOPHEN 325 MG PO TABS
650.0000 mg | ORAL_TABLET | ORAL | Status: DC | PRN
  Administered 2018-02-14 – 2018-02-18 (×4): 650 mg via ORAL
  Filled 2018-02-10 (×4): qty 2

## 2018-02-10 MED ORDER — FENTANYL CITRATE (PF) 250 MCG/5ML IJ SOLN
INTRAMUSCULAR | Status: DC | PRN
  Administered 2018-02-10: 50 ug via INTRAVENOUS
  Administered 2018-02-10: 100 ug via INTRAVENOUS
  Administered 2018-02-10: 50 ug via INTRAVENOUS
  Administered 2018-02-10: 100 ug via INTRAVENOUS
  Administered 2018-02-10: 50 ug via INTRAVENOUS
  Administered 2018-02-10: 100 ug via INTRAVENOUS
  Administered 2018-02-10: 50 ug via INTRAVENOUS

## 2018-02-10 MED ORDER — ADULT MULTIVITAMIN W/MINERALS CH
ORAL_TABLET | Freq: Every day | ORAL | Status: DC
  Administered 2018-02-11 – 2018-02-18 (×8): 1 via ORAL
  Filled 2018-02-10 (×8): qty 1

## 2018-02-10 MED ORDER — ROCURONIUM BROMIDE 100 MG/10ML IV SOLN
INTRAVENOUS | Status: DC | PRN
Start: 2018-02-10 — End: 2018-02-10
  Administered 2018-02-10: 10 mg via INTRAVENOUS
  Administered 2018-02-10: 50 mg via INTRAVENOUS
  Administered 2018-02-10: 20 mg via INTRAVENOUS

## 2018-02-10 MED ORDER — OXYCODONE HCL 5 MG PO TABS
10.0000 mg | ORAL_TABLET | ORAL | Status: DC | PRN
  Administered 2018-02-10 – 2018-02-12 (×11): 10 mg via ORAL
  Filled 2018-02-10 (×11): qty 2

## 2018-02-10 MED ORDER — THROMBIN 20000 UNITS EX SOLR
CUTANEOUS | Status: AC
  Filled 2018-02-10: qty 20000

## 2018-02-10 MED ORDER — PROPOFOL 10 MG/ML IV BOLUS
INTRAVENOUS | Status: DC | PRN
Start: 2018-02-10 — End: 2018-02-10
  Administered 2018-02-10: 160 mg via INTRAVENOUS

## 2018-02-10 MED ORDER — ONDANSETRON HCL 4 MG/2ML IJ SOLN
INTRAMUSCULAR | Status: DC | PRN
  Administered 2018-02-10: 4 mg via INTRAVENOUS

## 2018-02-10 MED ORDER — LIDOCAINE HCL (CARDIAC) PF 100 MG/5ML IV SOSY
PREFILLED_SYRINGE | INTRAVENOUS | Status: DC | PRN
  Administered 2018-02-10: 100 mg via INTRATRACHEAL

## 2018-02-10 MED ORDER — VITAMIN B-12 1000 MCG PO TABS
1000.0000 ug | ORAL_TABLET | Freq: Every day | ORAL | Status: DC
  Administered 2018-02-11 – 2018-02-18 (×8): 1000 ug via ORAL
  Filled 2018-02-10 (×8): qty 1

## 2018-02-10 MED ORDER — MENTHOL 3 MG MT LOZG
1.0000 | LOZENGE | OROMUCOSAL | Status: DC | PRN
  Filled 2018-02-10: qty 9

## 2018-02-10 MED ORDER — HYDRALAZINE HCL 50 MG PO TABS
50.0000 mg | ORAL_TABLET | Freq: Three times a day (TID) | ORAL | Status: DC
  Administered 2018-02-10 – 2018-02-18 (×23): 50 mg via ORAL
  Filled 2018-02-10 (×22): qty 1

## 2018-02-10 MED ORDER — OXYCODONE HCL 5 MG PO TABS
ORAL_TABLET | ORAL | Status: AC
  Administered 2018-02-10: 10 mg
  Filled 2018-02-10: qty 2

## 2018-02-10 MED ORDER — 0.9 % SODIUM CHLORIDE (POUR BTL) OPTIME
TOPICAL | Status: DC | PRN
  Administered 2018-02-10: 1000 mL

## 2018-02-10 MED ORDER — VANCOMYCIN HCL 1000 MG IV SOLR
INTRAVENOUS | Status: AC
Start: 2018-02-10 — End: ?
  Filled 2018-02-10: qty 1000

## 2018-02-10 MED ORDER — SODIUM CHLORIDE 0.9 % IV SOLN: 250.0000 mL | INTRAVENOUS | Status: DC

## 2018-02-10 MED ORDER — THROMBIN (RECOMBINANT) 20000 UNITS EX SOLR
CUTANEOUS | Status: DC | PRN
  Administered 2018-02-10: 09:00:00 via TOPICAL

## 2018-02-10 MED ORDER — ONDANSETRON HCL 4 MG/2ML IJ SOLN: 4.0000 mg | Freq: Four times a day (QID) | INTRAMUSCULAR | Status: DC | PRN

## 2018-02-10 MED ORDER — PHENOL 1.4 % MT LIQD
1.0000 | OROMUCOSAL | Status: DC | PRN
  Administered 2018-02-18: 1 via OROMUCOSAL
  Filled 2018-02-10: qty 177

## 2018-02-10 MED ORDER — METOPROLOL TARTRATE 50 MG PO TABS: 50.0000 mg | ORAL_TABLET | Freq: Once | ORAL | Status: DC

## 2018-02-10 MED ORDER — VITAMIN E 180 MG (400 UNIT) PO CAPS
400.0000 [IU] | ORAL_CAPSULE | Freq: Every day | ORAL | Status: DC
  Administered 2018-02-11 – 2018-02-18 (×8): 400 [IU] via ORAL
  Filled 2018-02-10 (×8): qty 1

## 2018-02-10 MED ORDER — ESTRADIOL 0.1 MG/GM VA CREA: 1.0000 | TOPICAL_CREAM | Freq: Every day | VAGINAL | Status: DC | PRN

## 2018-02-10 MED ORDER — LABETALOL HCL 5 MG/ML IV SOLN
10.0000 mg | INTRAVENOUS | Status: DC | PRN
  Administered 2018-02-10: 10 mg via INTRAVENOUS

## 2018-02-10 MED ORDER — SODIUM CHLORIDE 0.9% FLUSH
3.0000 mL | INTRAVENOUS | Status: DC | PRN
  Administered 2018-02-11 – 2018-02-13 (×4): 3 mL via INTRAVENOUS
  Filled 2018-02-10 (×5): qty 3

## 2018-02-10 MED ORDER — SUCCINYLCHOLINE CHLORIDE 20 MG/ML IJ SOLN
INTRAMUSCULAR | Status: DC | PRN
  Administered 2018-02-10: 120 mg via INTRAVENOUS

## 2018-02-10 MED ORDER — MUSCLE RUB 10-15 % EX CREA: TOPICAL_CREAM | CUTANEOUS | Status: DC | PRN

## 2018-02-10 MED ORDER — GELATIN ABSORBABLE MT POWD
OROMUCOSAL | Status: DC | PRN
  Administered 2018-02-10: 09:00:00 via TOPICAL

## 2018-02-10 MED ORDER — HYDROMORPHONE HCL 1 MG/ML IJ SOLN
0.5000 mg | INTRAMUSCULAR | Status: DC | PRN
  Administered 2018-02-11: 0.5 mg via INTRAVENOUS
  Filled 2018-02-10: qty 1

## 2018-02-10 MED ORDER — GABAPENTIN 300 MG PO CAPS
300.0000 mg | ORAL_CAPSULE | Freq: Two times a day (BID) | ORAL | Status: DC
  Administered 2018-02-10 – 2018-02-18 (×16): 300 mg via ORAL
  Filled 2018-02-10 (×16): qty 1

## 2018-02-10 MED ORDER — SODIUM CHLORIDE 0.9 % IV SOLN
INTRAVENOUS | Status: DC | PRN
  Administered 2018-02-10: 09:00:00

## 2018-02-10 MED ORDER — LABETALOL HCL 5 MG/ML IV SOLN
INTRAVENOUS | Status: DC | PRN
  Administered 2018-02-10: 5 mg via INTRAVENOUS
  Administered 2018-02-10: 10 mg via INTRAVENOUS

## 2018-02-10 MED ORDER — DIPHENHYDRAMINE HCL 25 MG PO TABS
25.0000 mg | ORAL_TABLET | Freq: Two times a day (BID) | ORAL | Status: DC | PRN
  Filled 2018-02-10: qty 1

## 2018-02-10 MED ORDER — CLONIDINE HCL 0.1 MG PO TABS
0.1000 mg | ORAL_TABLET | Freq: Three times a day (TID) | ORAL | Status: DC
  Administered 2018-02-10 – 2018-02-18 (×25): 0.1 mg via ORAL
  Filled 2018-02-10 (×24): qty 1

## 2018-02-10 MED ORDER — LACTATED RINGERS IV SOLN: INTRAVENOUS | Status: DC

## 2018-02-10 MED ORDER — PROMETHAZINE HCL 25 MG/ML IJ SOLN: 6.2500 mg | INTRAMUSCULAR | Status: DC | PRN

## 2018-02-10 MED ORDER — BUPIVACAINE HCL (PF) 0.25 % IJ SOLN
INTRAMUSCULAR | Status: AC
Start: 2018-02-10 — End: ?
  Filled 2018-02-10: qty 30

## 2018-02-10 MED ORDER — ACETAMINOPHEN 500 MG PO TABS
1000.0000 mg | ORAL_TABLET | Freq: Two times a day (BID) | ORAL | Status: DC | PRN
  Administered 2018-02-13 – 2018-02-17 (×8): 1000 mg via ORAL
  Filled 2018-02-10 (×9): qty 2

## 2018-02-10 MED ORDER — POLYETHYLENE GLYCOL 3350 17 G PO PACK
17.0000 g | PACK | Freq: Every day | ORAL | Status: DC | PRN
  Administered 2018-02-14 – 2018-02-18 (×4): 17 g via ORAL
  Filled 2018-02-10 (×4): qty 1

## 2018-02-10 MED ORDER — VANCOMYCIN HCL IN DEXTROSE 1-5 GM/200ML-% IV SOLN
1000.0000 mg | Freq: Two times a day (BID) | INTRAVENOUS | Status: DC
  Administered 2018-02-10 – 2018-02-11 (×3): 1000 mg via INTRAVENOUS
  Filled 2018-02-10 (×4): qty 200

## 2018-02-10 MED ORDER — METOPROLOL TARTRATE 50 MG PO TABS
50.0000 mg | ORAL_TABLET | Freq: Two times a day (BID) | ORAL | Status: DC
  Administered 2018-02-10 – 2018-02-18 (×16): 50 mg via ORAL
  Filled 2018-02-10 (×16): qty 1

## 2018-02-10 MED ORDER — CALCIUM CARBONATE-VITAMIN D 500-200 MG-UNIT PO TABS
1.0000 | ORAL_TABLET | Freq: Every day | ORAL | Status: DC
  Administered 2018-02-11 – 2018-02-18 (×8): 1 via ORAL
  Filled 2018-02-10 (×8): qty 1

## 2018-02-10 MED ORDER — LIDOCAINE-EPINEPHRINE 1 %-1:100000 IJ SOLN
INTRAMUSCULAR | Status: AC
  Filled 2018-02-10: qty 1

## 2018-02-10 MED ORDER — TAPENTADOL HCL 50 MG PO TABS: 50.0000 mg | ORAL_TABLET | Freq: Four times a day (QID) | ORAL | Status: DC | PRN

## 2018-02-10 MED ORDER — DEXAMETHASONE SODIUM PHOSPHATE 10 MG/ML IJ SOLN
10.0000 mg | INTRAMUSCULAR | Status: AC
  Administered 2018-02-10: 10 mg via INTRAVENOUS
  Filled 2018-02-10: qty 1

## 2018-02-10 MED ORDER — LABETALOL HCL 5 MG/ML IV SOLN
INTRAVENOUS | Status: AC
  Filled 2018-02-10: qty 4

## 2018-02-10 MED ORDER — FLUTICASONE PROPIONATE 50 MCG/ACT NA SUSP: 1.0000 | Freq: Every day | NASAL | Status: DC | PRN

## 2018-02-10 SURGICAL SUPPLY — 93 items
ADH SKN CLS APL DERMABOND .7 (GAUZE/BANDAGES/DRESSINGS) ×1
APL SKNCLS STERI-STRIP NONHPOA (GAUZE/BANDAGES/DRESSINGS) ×1
BAG DECANTER FOR FLEXI CONT (MISCELLANEOUS) ×2 IMPLANT
BENZOIN TINCTURE PRP APPL 2/3 (GAUZE/BANDAGES/DRESSINGS) ×2 IMPLANT
BLADE CLIPPER SURG (BLADE) IMPLANT
BLADE SURG 11 STRL SS (BLADE) ×2 IMPLANT
BONE VIVIGEN FORMABLE 10CC (Bone Implant) ×2 IMPLANT
BONE VIVIGEN FORMABLE 5.4CC (Bone Implant) ×2 IMPLANT
BUR CUTTER 7.0 ROUND (BURR) ×2 IMPLANT
BUR MATCHSTICK NEURO 3.0 LAGG (BURR) ×2 IMPLANT
CANISTER SUCT 3000ML PPV (MISCELLANEOUS) ×2 IMPLANT
CAP LOCKING THREADED (Cap) ×8 IMPLANT
CARTRIDGE OIL MAESTRO DRILL (MISCELLANEOUS) ×1 IMPLANT
CONNECTOR INLINE 5.5-5.5 (Connector) ×1 IMPLANT
CONT SPEC 4OZ CLIKSEAL STRL BL (MISCELLANEOUS) ×2 IMPLANT
COVER BACK TABLE 60X90IN (DRAPES) ×2 IMPLANT
DECANTER SPIKE VIAL GLASS SM (MISCELLANEOUS) ×2 IMPLANT
DERMABOND ADVANCED (GAUZE/BANDAGES/DRESSINGS) ×1
DERMABOND ADVANCED .7 DNX12 (GAUZE/BANDAGES/DRESSINGS) ×1 IMPLANT
DIFFUSER DRILL AIR PNEUMATIC (MISCELLANEOUS) ×2 IMPLANT
DRAPE C-ARM 42X72 X-RAY (DRAPES) ×2 IMPLANT
DRAPE C-ARMOR (DRAPES) ×1 IMPLANT
DRAPE HALF SHEET 40X57 (DRAPES) ×1 IMPLANT
DRAPE LAPAROTOMY 100X72X124 (DRAPES) ×2 IMPLANT
DRAPE SURG 17X23 STRL (DRAPES) ×2 IMPLANT
DRSG OPSITE 4X5.5 SM (GAUZE/BANDAGES/DRESSINGS) ×2 IMPLANT
DRSG OPSITE POSTOP 4X10 (GAUZE/BANDAGES/DRESSINGS) ×1 IMPLANT
DRSG OPSITE POSTOP 4X8 (GAUZE/BANDAGES/DRESSINGS) ×2 IMPLANT
DURAPREP 26ML APPLICATOR (WOUND CARE) ×2 IMPLANT
ELECT BLADE 4.0 EZ CLEAN MEGAD (MISCELLANEOUS) ×2
ELECT REM PT RETURN 9FT ADLT (ELECTROSURGICAL) ×2
ELECTRODE BLDE 4.0 EZ CLN MEGD (MISCELLANEOUS) IMPLANT
ELECTRODE REM PT RTRN 9FT ADLT (ELECTROSURGICAL) ×1 IMPLANT
EVACUATOR 3/16  PVC DRAIN (DRAIN) ×1
EVACUATOR 3/16 PVC DRAIN (DRAIN) ×1 IMPLANT
GAUZE SPONGE 4X4 12PLY STRL (GAUZE/BANDAGES/DRESSINGS) ×2 IMPLANT
GAUZE SPONGE 4X4 16PLY XRAY LF (GAUZE/BANDAGES/DRESSINGS) ×2 IMPLANT
GLOVE BIO SURGEON STRL SZ7 (GLOVE) IMPLANT
GLOVE BIO SURGEON STRL SZ8 (GLOVE) ×2 IMPLANT
GLOVE BIOGEL PI IND STRL 7.0 (GLOVE) IMPLANT
GLOVE BIOGEL PI IND STRL 8 (GLOVE) IMPLANT
GLOVE BIOGEL PI IND STRL 8.5 (GLOVE) IMPLANT
GLOVE BIOGEL PI INDICATOR 7.0 (GLOVE) ×1
GLOVE BIOGEL PI INDICATOR 8 (GLOVE) ×1
GLOVE BIOGEL PI INDICATOR 8.5 (GLOVE) ×2
GLOVE EXAM NITRILE LRG STRL (GLOVE) IMPLANT
GLOVE EXAM NITRILE XL STR (GLOVE) IMPLANT
GLOVE EXAM NITRILE XS STR PU (GLOVE) IMPLANT
GLOVE INDICATOR 8.5 STRL (GLOVE) ×2 IMPLANT
GLOVE SURG SS PI 7.5 STRL IVOR (GLOVE) ×4 IMPLANT
GOWN STRL REUS W/ TWL LRG LVL3 (GOWN DISPOSABLE) IMPLANT
GOWN STRL REUS W/ TWL XL LVL3 (GOWN DISPOSABLE) ×2 IMPLANT
GOWN STRL REUS W/TWL 2XL LVL3 (GOWN DISPOSABLE) IMPLANT
GOWN STRL REUS W/TWL LRG LVL3 (GOWN DISPOSABLE) ×4
GOWN STRL REUS W/TWL XL LVL3 (GOWN DISPOSABLE) ×6
GRAFT BN 10X1XDBM MAGNIFUSE (Bone Implant) IMPLANT
GRAFT BNE MATRIX VG FRMBL L 10 (Bone Implant) IMPLANT
GRAFT BNE MATRIX VG FRMBL MD 5 (Bone Implant) IMPLANT
GRAFT BONE MAGNIFUSE 1X10CM (Bone Implant) ×2 IMPLANT
HEMOSTAT POWDER SURGIFOAM 1G (HEMOSTASIS) ×1 IMPLANT
KIT BASIN OR (CUSTOM PROCEDURE TRAY) ×2 IMPLANT
KIT INFUSE MEDIUM (Orthopedic Implant) ×1 IMPLANT
KIT POSITION SURG JACKSON T1 (MISCELLANEOUS) ×2 IMPLANT
KIT TURNOVER KIT B (KITS) ×2 IMPLANT
MILL MEDIUM DISP (BLADE) ×2 IMPLANT
NDL HYPO 21X1.5 SAFETY (NEEDLE) ×1 IMPLANT
NDL HYPO 25X1 1.5 SAFETY (NEEDLE) ×1 IMPLANT
NEEDLE HYPO 21X1.5 SAFETY (NEEDLE) ×2 IMPLANT
NEEDLE HYPO 25X1 1.5 SAFETY (NEEDLE) ×2 IMPLANT
NS IRRIG 1000ML POUR BTL (IV SOLUTION) ×2 IMPLANT
OIL CARTRIDGE MAESTRO DRILL (MISCELLANEOUS) ×2
PACK LAMINECTOMY NEURO (CUSTOM PROCEDURE TRAY) ×2 IMPLANT
PAD ARMBOARD 7.5X6 YLW CONV (MISCELLANEOUS) ×6 IMPLANT
RASP 3.0MM (RASP) ×1 IMPLANT
ROD 100MM (Rod) ×2 IMPLANT
ROD SPINE STR CREO 5.5X150 (Rod) ×1 IMPLANT
ROD SPNL STRG 100X5.5XNS (Rod) IMPLANT
SCREW CREO 5.5X40 (Screw) ×2 IMPLANT
SCREW CREO MOD AMP 5.5X45MM (Screw) ×4 IMPLANT
SCREW PA THRD CREO TULIP 5.5X4 (Head) ×6 IMPLANT
SPONGE LAP 4X18 X RAY DECT (DISPOSABLE) ×1 IMPLANT
SPONGE SURGIFOAM ABS GEL 100 (HEMOSTASIS) ×2 IMPLANT
STRIP CLOSURE SKIN 1/2X4 (GAUZE/BANDAGES/DRESSINGS) ×4 IMPLANT
SUT VIC AB 0 CT1 18XCR BRD8 (SUTURE) ×2 IMPLANT
SUT VIC AB 0 CT1 8-18 (SUTURE) ×4
SUT VIC AB 2-0 CT1 18 (SUTURE) ×5 IMPLANT
SUT VIC AB 4-0 PS2 27 (SUTURE) ×2 IMPLANT
SYR 20CC LL (SYRINGE) ×2 IMPLANT
TOWEL GREEN STERILE (TOWEL DISPOSABLE) ×2 IMPLANT
TOWEL GREEN STERILE FF (TOWEL DISPOSABLE) ×2 IMPLANT
TRAY FOLEY BAG SILVER LF 16FR (CATHETERS) ×1 IMPLANT
TRAY FOLEY MTR SLVR 16FR STAT (SET/KITS/TRAYS/PACK) ×1 IMPLANT
WATER STERILE IRR 1000ML POUR (IV SOLUTION) ×2 IMPLANT

## 2018-02-10 NOTE — Op Note (Signed)
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  #2 placement of bilateral pedicle screws at T10, T11, T12 and tying into her old construct at L2-3 utilizing the globus addition set and globus Creole amp screws  #3 posterior lateral arthrodesis T10-L3 utilizing BMP, vivigen, magnafuse   Surgeon: Dominica Severin cram  Asst.: Newman Pies  Anesthesia: Gen.  EBL: Minimal  History of present illness: 72 year old with long-standing issues with her back previously undergone L1-S1 fusion to a few different operations last surgery was an L1-2 revision operation that she pseudoarthrosis to. She presented with increased back pain and evidence of a very loose left L1 screw. Due to her progression of clinical syndrome imaging findings and failure conservative treatment I recommended revision of fusion extension up to T10. I extensively went over the risks and benefits of the operation with her as well as perioperative course expectations of outcome and alternatives of surgery and she understood and agreed to proceed forward.  Operative procedure: Patient brought into the or was induced under general anesthesia positioned prone the Wilson frame her back was prepped and draped in routine sterile fashion her old incision was opened up and the superior aspect of it and extended superiorly and dissected was carried down to the fascia and subperiosteal dissections care on the lamina from T9 down to exposure old construct down to below her L3 screw. There was a dense amount of bone graft had overgrown the loose L1 screw. This was all bitten off of the screw and attention was first taken to after adequate exposure thoracic spine I placed the C6 pedicle screws from T10, T11, T12 bilaterally lysing AP and  lateral fluoroscopy. Fluoroscopy confirmed all positioning and good positions of the implants. Then I took a titanium cutting drill bit and cut the rod between L1 and L2 then I removed the loose L1 screw and wax that hole. I then dissected around the residual rod above the L2 screw on the patient's left side and used an and and connector lined everything up and and attached the and then connected to the residual rod as well as to a new rod captured the new screws. Anchored all that in place then on the patient's right side I sized upper rod and contoured it extending from T10 down L3 but prior to rod placement I aggressively decorticated the TPs lateral gutters prior to this inspection of the L2-3 fusion was solid set after aggressive decortication bilaterally from L3 up to T10 I packed the BMP and vivigen and magnafuse posterior laterally from L3 up to T10. I did the same thing on the patient's left side. I then attached the rod on the patient's right side connector nuts. Construct appeared to be solid then injected exparell the fascia sprinkle vancomycin powder in the wound inserted a drain and closed the wound in layers with interrupted Vicryl running 4 subcuticular Dermabond benzo and Steri-Strips and sterile dressing. At the end of case all needle counts sponge counts were correct.

## 2018-02-10 NOTE — Anesthesia Preprocedure Evaluation (Addendum)
Anesthesia Evaluation  Patient identified by MRN, date of birth, ID band Patient awake    Reviewed: Allergy & Precautions, NPO status , Patient's Chart, lab work & pertinent test results  Airway Mallampati: II  TM Distance: >3 FB Neck ROM: Full    Dental  (+) Dental Advisory Given, Teeth Intact, Chipped,    Pulmonary asthma , sleep apnea , former smoker,    breath sounds clear to auscultation       Cardiovascular hypertension, Pt. on home beta blockers and Pt. on medications + Peripheral Vascular Disease   Rhythm:Regular Rate:Normal     Neuro/Psych  Headaches, PSYCHIATRIC DISORDERS Anxiety Depression  Neuromuscular disease    GI/Hepatic Neg liver ROS, hiatal hernia, GERD  Medicated,  Endo/Other  negative endocrine ROS  Renal/GU negative Renal ROS     Musculoskeletal  (+) Arthritis , Fibromyalgia -  Abdominal (+) + obese,   Peds  Hematology   Anesthesia Other Findings   Reproductive/Obstetrics                            Lab Results  Component Value Date   WBC 3.9 (L) 02/04/2018   HGB 13.7 02/04/2018   HCT 42.2 02/04/2018   MCV 91.3 02/04/2018   PLT 322 02/04/2018   Lab Results  Component Value Date   CREATININE 0.92 02/04/2018   BUN 15 02/04/2018   NA 140 02/04/2018   K 3.4 (L) 02/04/2018   CL 102 02/04/2018   CO2 28 02/04/2018   Lab Results  Component Value Date   INR 1.08 07/03/2016   INR 0.95 04/21/2014   INR 0.91 06/08/2011   EKG: normal sinus rhythm.  Anesthesia Physical Anesthesia Plan  ASA: III  Anesthesia Plan: General   Post-op Pain Management:    Induction: Intravenous  PONV Risk Score and Plan: 4 or greater and Ondansetron, Dexamethasone, Midazolam and Scopolamine patch - Pre-op  Airway Management Planned: Oral ETT and Video Laryngoscope Planned  Additional Equipment: None  Intra-op Plan:   Post-operative Plan: Extubation in OR  Informed Consent:  I have reviewed the patients History and Physical, chart, labs and discussed the procedure including the risks, benefits and alternatives for the proposed anesthesia with the patient or authorized representative who has indicated his/her understanding and acceptance.   Dental advisory given  Plan Discussed with: CRNA  Anesthesia Plan Comments:        Anesthesia Quick Evaluation

## 2018-02-10 NOTE — Transfer of Care (Signed)
Immediate Anesthesia Transfer of Care Note  Patient: Amy [name]  Procedure(s) Performed: Posterior Lateral Fusion - Thoracic ten-Thoracic eleven - Thoracic eleven-Thoracic twelve- Thoracic twelve-Lumbar one removal Lumbar one-Lumbar three (N/A Back)  Patient Location: PACU  Anesthesia Type:General  Level of Consciousness: awake, alert  and oriented  Airway & Oxygen Therapy: Patient Spontanous Breathing and Patient connected to nasal cannula oxygen  Post-op Assessment: Report given to RN, Post -op Vital signs reviewed and stable and Patient moving all extremities X 4  Post vital signs: Reviewed and stable  Last Vitals:  Vitals Value Taken Time  BP 161/87 02/10/2018  1:01 PM  Temp    Pulse 81 02/10/2018  1:05 PM  Resp 13 02/10/2018  1:05 PM  SpO2 100 % 02/10/2018  1:05 PM  Vitals shown include unvalidated device data.  Last Pain:  Vitals:   02/10/18 0820  TempSrc: Oral  PainSc:       Patients Stated Pain Goal: 3 (67/20/94 7096)  Complications: No apparent anesthesia complications

## 2018-02-10 NOTE — Evaluation (Signed)
Physical Therapy Evaluation Patient Details Name: ROBINA HAMOR MRN: [id number] DOB: November 20, 1945 Today's Date: 02/10/2018   History of Present Illness  pt is a 72 y/o female with pmh significant for chronic back pain with fusion surgeries, neuropathy and PVD, admitted with worsening back pain and pseudoarthrosis at L1-L2.  pt s/p removal of L1-L3 hardware, pedical screws at T10-12 with tie in to ole L23 construct and PLA T10-L3.  Clinical Impression  Pt admitted with/for elective lumbar fusion surgery.  Pt needing significant assist post surgery due to increased pain, but expect steady improvement.  Pt currently limited functionally due to the problems listed below.  (see problems list.)  Pt will benefit from PT to maximize function and safety to be able to get home safely with available assist.     Follow Up Recommendations No PT follow up;Supervision/Assistance - 24 hour    Equipment Recommendations  Rolling walker with 5" wheels    Recommendations for Other Services       Precautions / Restrictions Precautions Precautions: Back Precaution Booklet Issued: No Required Braces or Orthoses: Spinal Brace Spinal Brace: Other (comment)(for comfort)      Mobility  Bed Mobility Overal bed mobility: Needs Assistance Bed Mobility: Rolling;Sidelying to Sit;Sit to Sidelying Rolling: Min assist Sidelying to sit: Mod assist     Sit to sidelying: Mod assist;+2 for physical assistance General bed mobility comments: cues to reinforce sequencing and truncal assist due to so much pain  Transfers Overall transfer level: Needs assistance   Transfers: Sit to/from Stand Sit to Stand: Mod assist            Ambulation/Gait Ambulation/Gait assistance: Min assist Ambulation Distance (Feet): 3 Feet Assistive device: Rolling walker (2 wheeled)       General Gait Details: limited due to pain level  Stairs            Wheelchair Mobility    Modified Rankin (Stroke  Patients Only)       Balance Overall balance assessment: No apparent balance deficits (not formally assessed)                                           Pertinent Vitals/Pain Pain Assessment: Faces Faces Pain Scale: Hurts whole lot Pain Location: back Pain Descriptors / Indicators: Operative site guarding;Grimacing Pain Intervention(s): Monitored during session;Limited activity within patient's tolerance    Home Living Family/patient expects to be discharged to:: Private residence Living Arrangements: Spouse/significant other Available Help at Discharge: Family;Available 24 hours/day Type of Home: House Home Access: Stairs to enter Entrance Stairs-Rails: Psychiatric nurse of Steps: 2/3 Home Layout: One level Home Equipment: Cane - single point;Shower seat      Prior Function Level of Independence: Independent               Hand Dominance        Extremity/Trunk Assessment   Upper Extremity Assessment Upper Extremity Assessment: Defer to OT evaluation    Lower Extremity Assessment Lower Extremity Assessment: Overall WFL for tasks assessed(proximal weakness bil)       Communication   Communication: No difficulties  Cognition Arousal/Alertness: Awake/alert Behavior During Therapy: WFL for tasks assessed/performed Overall Cognitive Status: Within Functional Limits for tasks assessed  General Comments General comments (skin integrity, edema, etc.): pt instructed in back care/prec, log roll/transitions  side to/from sitting, lifting restrictions, and progression of activity.    Exercises     Assessment/Plan    PT Assessment Patient needs continued PT services  PT Problem List Decreased strength;Decreased activity tolerance;Decreased mobility;Decreased knowledge of use of DME;Decreased knowledge of precautions;Pain       PT Treatment Interventions DME  instruction;Gait training;Stair training;Functional mobility training;Therapeutic activities;Patient/family education    PT Goals (Current goals can be found in the Care Plan section)  Acute Rehab PT Goals Patient Stated Goal: low pain, independence PT Goal Formulation: With patient Time For Goal Achievement: 02/17/18 Potential to Achieve Goals: Good    Frequency Min 5X/week   Barriers to discharge        Co-evaluation               AM-PAC PT "6 Clicks" Daily Activity  Outcome Measure Difficulty turning over in bed (including adjusting bedclothes, sheets and blankets)?: Unable Difficulty moving from lying on back to sitting on the side of the bed? : Unable Difficulty sitting down on and standing up from a chair with arms (e.g., wheelchair, bedside commode, etc,.)?: Unable Help needed moving to and from a bed to chair (including a wheelchair)?: A Lot Help needed walking in hospital room?: A Lot Help needed climbing 3-5 steps with a railing? : A Lot 6 Click Score: 9    End of Session   Activity Tolerance: Patient tolerated treatment well Patient left: in bed;with call bell/phone within reach;with bed alarm set Nurse Communication: Mobility status PT Visit Diagnosis: Other abnormalities of gait and mobility (R26.89);Pain Pain - part of body: (back)    Time: 2979-8921 PT Time Calculation (min) (ACUTE ONLY): 41 min   Charges:   PT Evaluation $PT Eval Moderate Complexity: 1 Mod PT Treatments $Gait Training: 8-22 mins $Self Care/Home Management: 8-22   PT G Codes:        02-20-18  Donnella Sham, PT [phone]  (pager)  [name] 02/20/2018, 9:01 PM

## 2018-02-10 NOTE — Anesthesia Procedure Notes (Signed)
Procedure Name: Intubation Date/Time: 02/10/2018 9:01 AM Performed by: Mariea Clonts, CRNA Pre-anesthesia Checklist: Patient identified, Emergency Drugs available, Suction available and Patient being monitored Patient Re-evaluated:Patient Re-evaluated prior to induction Oxygen Delivery Method: Circle System Utilized Preoxygenation: Pre-oxygenation with 100% oxygen Induction Type: IV induction Ventilation: Mask ventilation without difficulty Laryngoscope Size: Glidescope and 4 Grade View: Grade I Tube type: Oral Tube size: 7.5 mm Number of attempts: 1 Airway Equipment and Method: Stylet and Oral airway Placement Confirmation: ETT inserted through vocal cords under direct vision,  positive ETCO2 and breath sounds checked- equal and bilateral Tube secured with: Tape Dental Injury: Teeth and Oropharynx as per pre-operative assessment

## 2018-02-10 NOTE — Progress Notes (Signed)
Pharmacy Antibiotic Note  Amy [name] is a 72 y.o. female admitted on 02/10/2018 for procedure for exploration of prior lumbar fusion. Pt has a documented penicillin allergy but a note in her chart suggests she has taken and tolerated amoxicillin. Patient does have a lumbar drain; therefore, will continue vancomycin for a duration of the physicians discretion. No culture data.    Plan: -Vancomycin 1 g IV q12h -Monitor renal cx -Check trough as indicated -F/u LOT   Harvel Quale 02/10/2018 4:39 PM

## 2018-02-10 NOTE — Social Work (Signed)
CSW acknowledging consult for SNF, await post operative PT/OT evaluations. CSW will continue to follow.   Alexander Mt, [place] [facility] [phone]

## 2018-02-10 NOTE — H&P (Signed)
Amy [name] is an 72 y.o. female.   Chief Complaint: back pain HPI: 12-year-old female with long-standing back pain previous L1 S1 fusion over a couple different operations. Pain is can progressively worse workup has revealed pseudoarthrosis at L1-L2 with loosening of her L1 screw. Due to patient's progression of clinical syndrome imaging findings and failure conservative treatment I recommended exploration of fusion removal of hardware and then extension up to T10. I've extensively gone over the risks and benefits of this operation with her as well as perioperative course expectations of outcome and alternatives of surgery and she understands and agrees to proceed forward.  Past Medical History:  Diagnosis Date  . Abdominal pain of unknown etiology 02/05/2016  . Anxiety   . Arthritis    ddd- RA  . Asthma    "sleeping asthma"  . Chronic back pain    lumbar steroid injection's  . Complication of anesthesia    Hard to wake up. Pt sts is took 3 days.  . Constipation   . Depression   . Dysphonia    intermittent "voice changes"  . Fibromyalgia   . GERD (gastroesophageal reflux disease)   . Glaucoma   . Headache   . History of DVT (deep vein thrombosis) 1989   LEFT UPPER ARM  . History of hiatal hernia   . History of MRSA infection 2011  . Hypertension   . Irregular heart rate    "years ago"  . Low iron   . Lumbago   . Mild obstructive sleep apnea    per study 02-07-2006 - no cpap  . Neuropathy    feet  . Pelvic pain in female   . Peripheral vascular disease ([place])    "poor circulation"  . SBO (small bowel obstruction) ([place]) 06/2016  . Seasonal allergies   . Short of breath on exertion   . Urinary frequency   . Weakness    both hands and feet    Past Surgical History:  Procedure Laterality Date  . abdominal adhesions removed    . ABDOMINAL HYSTERECTOMY  1978  . BACK SURGERY    . BUNIONECTOMY Left 2008  . COLON SURGERY    . COLONOSCOPY    . CYSTO WITH  HYDRODISTENSION N/A 07/12/2015   Procedure: CYSTOSCOPY/HYDRODISTENSION;  Surgeon: Bjorn Loser, MD;  [facility];  Service: Urology;  Laterality: N/A;  . DILATION AND CURETTAGE OF UTERUS    . ESOPHAGEAL MANOMETRY N/A 11/22/2014   Procedure: ESOPHAGEAL MANOMETRY (EM);  Surgeon: Winfield Cunas., MD;  Location: [facility];  Service: Endoscopy;  Laterality: N/A;  . ESOPHAGOGASTRODUODENOSCOPY N/A 07/15/2013   Procedure: ESOPHAGOGASTRODUODENOSCOPY (EGD);  Surgeon: Winfield Cunas., MD;  Location: [facility];  Service: Endoscopy;  Laterality: N/A;  need xray  . EYE SURGERY Bilateral    cataract surgery with lens implants  . HARDWARE REMOVAL N/A 01/02/2016   Procedure: Exploration of Lumbar Fusion,Removal of hardware Lumbar One-Two ;Redo Posterior Lumbar Fusion Lumbar One-Two;  Surgeon: Kary Kos, MD;  Location: [place] [facility];  Service: Neurosurgery;  Laterality: N/A;  . LAPAROSCOPIC CHOLECYSTECTOMY  01-11-2006  . LAPAROSCOPY N/A 07/06/2016   Procedure: LAPAROSCOPY DIAGNOSTIC EMERGENT TO OPEN;  Surgeon: Greer Pickerel, MD;  Location: [place];  Service: General;  Laterality: N/A;  . LAPAROTOMY N/A 07/06/2016   Procedure: EXPLORATORY LAPAROTOMY LYSIS OF ADHESIONS FOR 3 HOURS;  Surgeon: Greer Pickerel, MD;  Location: [place];  Service: General;  Laterality: N/A;  . LUMBAR FUSION  2014  L4 -- L5  . MASS EXCISION Left 02/20/2013   Procedure: EXCISION LEFT BUTTOCK  MASS;  Surgeon: Adin Hector, MD;  Location: [facility];  Service: General;  Laterality: Left;  . NOSE SURGERY  2007  . REMOVAL HARDWARE L4-L5/  BILATERAL LAMINECTOMY L2 - L5 AND FUSION  06-12-2011  . SHOULDER OPEN ROTATOR CUFF REPAIR Left 05/05/2014   Procedure: OPEN ACROMIONECTOMY AND OPEN REPAIR OF ROTATOR CUFF, TISSUEMEND GRAFT WITH ANCHOR ;  Surgeon: Tobi Bastos, MD;  Location: [facility];  Service: Orthopedics;  Laterality: Left;  . SPINE SURGERY    . TONSILLECTOMY      Family History  Problem Relation Age of  Onset  . Hypertension Mother   . Cancer Mother        cervix  . Kidney disease Mother   . Hypertension Sister   . Cancer Sister        polyps  . Kidney disease Brother   . Cancer Daughter        leukemia  . Hypertension Brother    Social History:  reports that she quit smoking about 48 years ago. She has never used smokeless tobacco. She reports that she drinks alcohol. She reports that she does not use drugs.  Allergies:  Allergies  Allergen Reactions  . Latex Other (See Comments)    Sores, blisters - reaction to gloves  . Penicillins Other (See Comments)    UNSPECIFIED REACTION  "Has taken amoxicillin and ampicillin with no reaction" Has patient had a PCN reaction causing immediate rash, facial/tongue/throat swelling, SOB or lightheadedness with hypotension: Unknown Has patient had a PCN reaction causing severe rash involving mucus membranes or skin necrosis: Unknown Has patient had a PCN reaction that required hospitalization: Unknown Has patient had a PCN reaction occurring within the last 10 years: No   . Tomato Rash    Medications Prior to Admission  Medication Sig Dispense Refill  . acetaminophen (TYLENOL) 500 MG tablet Take 1,000 mg by mouth 2 (two) times daily as needed for moderate pain.     Marland Kitchen azelaic acid (AZELEX) 20 % cream Apply 1 application topically 2 (two) times daily. After skin is thoroughly washed and patted dry, gently but thoroughly massage a thin film of azelaic acid cream into the affected area twice daily, in the morning and evening.    . B Complex Vitamins (VITAMIN B COMPLEX PO) Take 1 tablet by mouth daily.    . calcium-vitamin D (OSCAL WITH D) 500-200 MG-UNIT tablet Take 1 tablet by mouth daily with breakfast.    . cloNIDine (CATAPRES) 0.1 MG tablet Take 1 tablet (0.1 mg total) by mouth 3 (three) times daily. 60 tablet 11  . diphenhydrAMINE (BENADRYL) 25 MG tablet Take 25 mg by mouth 2 (two) times daily as needed for allergies.    Marland Kitchen estradiol  (ESTRACE) 0.1 MG/GM vaginal cream Place 1 Applicatorful vaginally 2 (two) times a week. (Patient taking differently: Place 1 Applicatorful vaginally daily as needed (dryness). ) 42.5 g 12  . fluticasone (FLONASE) 50 MCG/ACT nasal spray Place 1 spray into both nostrils daily. (Patient taking differently: Place 1 spray into both nostrils daily as needed for allergies. )  2  . gabapentin (NEURONTIN) 300 MG capsule Take 300 mg by mouth 2 (two) times daily.    . hydrALAZINE (APRESOLINE) 50 MG tablet Take 50 mg by mouth 3 (three) times daily with meals.     . Menthol-Methyl Salicylate (MUSCLE RUB EX) Apply 1 application topically 2 (two)  times daily as needed (pain).    . metoprolol (LOPRESSOR) 50 MG tablet Take 1 tablet (50 mg total) by mouth 2 (two) times daily.    . Multiple Vitamins-Minerals (CENTRUM SILVER PO) Take 1 tablet by mouth daily.    Marland Kitchen NIFEdipine (PROCARDIA) 10 MG capsule Take 10 mg by mouth 3 (three) times daily.    . ondansetron (ZOFRAN) 4 MG tablet Take 1 tablet (4 mg total) by mouth every 6 (six) hours as needed for nausea. 20 tablet 0  . OVER THE COUNTER MEDICATION Take 2 tablets by mouth daily. Swiss Kriss OTC herbal laxative    . pantoprazole (PROTONIX) 40 MG tablet Take 2 tablets by mouth daily.  0  . PARoxetine (PAXIL) 30 MG tablet Take 30 mg by mouth daily.    . polyethylene glycol (MIRALAX / GLYCOLAX) packet Take 17 g by mouth daily. (Patient taking differently: Take 17 g by mouth daily as needed for mild constipation. ) 14 each 0  . Probiotic Product (PROBIOTIC PO) Take 1 capsule by mouth daily.    Marland Kitchen Propylene Glycol (SYSTANE COMPLETE) 0.6 % SOLN Place 1 drop into both eyes 2 (two) times daily as needed (dry eyes).    . tapentadol (NUCYNTA) 50 MG tablet Take 50 mg by mouth every 6 (six) hours as needed for moderate pain.    . vitamin B-12 (CYANOCOBALAMIN) 1000 MCG tablet Take 1,000 mcg by mouth daily.    . vitamin E (VITAMIN E) 400 UNIT capsule Take 400 Units by mouth daily.       No results found for this or any previous visit (from the past 48 hour(s)). No results found.  Review of Systems  Musculoskeletal: Positive for back pain.    There were no vitals taken for this visit. Physical Exam  Constitutional: She is oriented to person, place, and time. She appears well-developed and well-nourished.  HENT:  Head: Normocephalic.  Eyes: Pupils are equal, round, and reactive to light.  Neck: Normal range of motion.  Respiratory: Effort normal.  GI: Soft. Bowel sounds are normal.  Neurological: She is alert and oriented to person, place, and time. She has normal strength. GCS eye subscore is 4. GCS verbal subscore is 5. GCS motor subscore is 6.  Patient is awake alert oriented strength 5 out of 5 lower extremities bilaterally.  Skin: Skin is warm and dry.     Assessment/Plan A 72 year old female presents for exploration of fusion removal of hardware and extension at T10.  CRAM,GARY P, MD 02/10/2018, 7:49 AM

## 2018-02-11 MED FILL — Thrombin For Soln 20000 Unit: CUTANEOUS | Qty: 1 | Status: AC

## 2018-02-11 MED FILL — Thrombin For Soln 5000 Unit: CUTANEOUS | Qty: 5000 | Status: AC

## 2018-02-11 NOTE — Anesthesia Postprocedure Evaluation (Signed)
Anesthesia Post Note  Patient: Amy [name]  Procedure(s) Performed: Posterior Lateral Fusion - Thoracic ten-Thoracic eleven - Thoracic eleven-Thoracic twelve- Thoracic twelve-Lumbar one removal Lumbar one-Lumbar three (N/A Back)     Patient location during evaluation: PACU Anesthesia Type: General Level of consciousness: awake and alert Pain management: pain level controlled Vital Signs Assessment: post-procedure vital signs reviewed and stable Respiratory status: spontaneous breathing, nonlabored ventilation, respiratory function stable and patient connected to nasal cannula oxygen Cardiovascular status: blood pressure returned to baseline and stable Postop Assessment: no apparent nausea or vomiting Anesthetic complications: no    Last Vitals:  Vitals:   02/11/18 1200 02/11/18 1619  BP: 123/68 116/65  Pulse:  76  Resp:    Temp: 37.1 C 37 C  SpO2:  96%    Last Pain:  Vitals:   02/11/18 1619  TempSrc: Oral  PainSc:                  Effie Berkshire

## 2018-02-11 NOTE — Progress Notes (Signed)
Physical Therapy Treatment Patient Details Name: Amy [name] MRN: [id number] DOB: [birth year] Today's Date: 02/11/2018    History of Present Illness pt is a 72 y/o female with pmh significant for chronic back pain with fusion surgeries, neuropathy and PVD, admitted with worsening back pain and pseudoarthrosis at L1-L2.  pt s/p removal of L1-L3 hardware, pedical screws at T10-12 with tie in to ole L23 construct and PLA T10-L3.    PT Comments    Pt still very painful even with pain meds, but despite the pain pushed herself to simulate some ADLs and ambulate.  Reinforced all back education.    Follow Up Recommendations  No PT follow up;Supervision/Assistance - 24 hour     Equipment Recommendations  Rolling walker with 5" wheels    Recommendations for Other Services       Precautions / Restrictions Precautions Precautions: Back Precaution Booklet Issued: No Precaution Comments: reviewed back precautions and ADL A/E use with handouts provided Required Braces or Orthoses: Spinal Brace Spinal Brace: Other (comment) Restrictions Weight Bearing Restrictions: No    Mobility  Bed Mobility Overal bed mobility: Needs Assistance Bed Mobility: Rolling;Sidelying to Sit Rolling: Min assist Sidelying to sit: Mod assist     Sit to sidelying: Min assist General bed mobility comments: cues to reinforce sequencing and truncal assist due to so much pain  Transfers Overall transfer level: Needs assistance Equipment used: Rolling walker (2 wheeled) Transfers: Sit to/from Stand Sit to Stand: Min assist         General transfer comment: cues for hand placement and minimal assist with elevated surface.  Ambulation/Gait Ambulation/Gait assistance: Min assist Ambulation Distance (Feet): 50 Feet Assistive device: Rolling walker (2 wheeled) Gait Pattern/deviations: Step-through pattern Gait velocity: slower Gait velocity interpretation: <1.8 ft/sec, indicate of risk for  recurrent falls General Gait Details: slow short tentative steps   Stairs             Wheelchair Mobility    Modified Rankin (Stroke Patients Only)       Balance Overall balance assessment: No apparent balance deficits (not formally assessed)                                          Cognition Arousal/Alertness: Awake/alert Behavior During Therapy: WFL for tasks assessed/performed Overall Cognitive Status: Within Functional Limits for tasks assessed                                        Exercises      General Comments General comments (skin integrity, edema, etc.): reinforced back care/prec, log roll/transitions  side to/from sitting, lifting restrictions, and progression of activity.      Pertinent Vitals/Pain Pain Assessment: Faces Faces Pain Scale: Hurts even more Pain Location: back Pain Descriptors / Indicators: Operative site guarding;Grimacing Pain Intervention(s): Monitored during session    Home Living Family/patient expects to be discharged to:: Private residence Living Arrangements: Spouse/significant other Available Help at Discharge: Family;Available 24 hours/day Type of Home: House Home Access: Stairs to enter Entrance Stairs-Rails: Right;Left Home Layout: One level Home Equipment: Cane - single point;Shower seat      Prior Function Level of Independence: Independent          PT Goals (current goals can now be found in the care plan section) Acute Rehab  PT Goals Patient Stated Goal: low pain, independence PT Goal Formulation: With patient Time For Goal Achievement: 02/17/18 Potential to Achieve Goals: Good Progress towards PT goals: Progressing toward goals    Frequency    Min 5X/week      PT Plan Current plan remains appropriate    Co-evaluation PT/OT/SLP Co-Evaluation/Treatment: Yes Reason for Co-Treatment: For patient/therapist safety PT goals addressed during session: Mobility/safety  with mobility        AM-PAC PT "6 Clicks" Daily Activity  Outcome Measure  Difficulty turning over in bed (including adjusting bedclothes, sheets and blankets)?: Unable Difficulty moving from lying on back to sitting on the side of the bed? : Unable Difficulty sitting down on and standing up from a chair with arms (e.g., wheelchair, bedside commode, etc,.)?: Unable Help needed moving to and from a bed to chair (including a wheelchair)?: A Lot Help needed walking in hospital room?: A Little Help needed climbing 3-5 steps with a railing? : A Lot 6 Click Score: 10    End of Session Equipment Utilized During Treatment: (brace for comfort) Activity Tolerance: Patient tolerated treatment well Patient left: in chair;with call bell/phone within reach;with chair alarm set Nurse Communication: Mobility status PT Visit Diagnosis: Other abnormalities of gait and mobility (R26.89);Pain Pain - part of body: (back)     Time: 8182-9937 PT Time Calculation (min) (ACUTE ONLY): 38 min  Charges:  $Gait Training: 8-22 mins                    G Codes:       02/19/2018  Donnella Sham, PT [phone]  (pager)   Amy [name] 02/19/18, 4:31 PM

## 2018-02-11 NOTE — Progress Notes (Signed)
Subjective: Patient reports She did well back pain well-controlled no radicular symptoms  Objective: Vital signs in last 24 hours: Temp:  [97.3 F (36.3 C)-98.2 F (36.8 C)] 98.2 F (36.8 C) (05/21 0300) Pulse Rate:  [71-98] 77 (05/21 0300) Resp:  [14-23] 16 (05/20 1500) BP: (130-180)/(62-100) 144/69 (05/21 0300) SpO2:  [95 %-100 %] 96 % (05/21 0300) Weight:  [122.5 kg (270 lb)] 122.5 kg (270 lb) (05/20 0808)  Intake/Output from previous day: 05/20 0701 - 05/21 0700 In: 2661 [P.O.:120; I.V.:2341; IV Piggyback:200] Out: 1900 [Urine:1350; Drains:450; Blood:100] Intake/Output this shift: No intake/output data recorded.  Awake alert oriented strength 5 out of 5 wound clean dry and intact  Lab Results: No results for input(s): WBC, HGB, HCT, PLT in the last 72 hours. BMET No results for input(s): NA, K, CL, CO2, GLUCOSE, BUN, CREATININE, CALCIUM in the last 72 hours.  Studies/Results: Dg Thoracolumabar Spine  Result Date: 02/10/2018 CLINICAL DATA:  72 year old female with a history of revision spinal fixation. EXAM: LUMBAR SPINE - 2-3 VIEW; DG C-ARM 61-120 MIN COMPARISON:  CT 09/25/2017 FINDINGS: Limited intraoperative frontal and lateral view of thoracolumbar spine, with incompletely imaged hardware. Presuming the L1-L2 level is the level with a disc spacer and associated subsidence, as seen on prior CT, there are now postoperative changes of bilateral pedicle screws at T10, T11, T12. Surgical sponges project over the posterior elements of L1. Incompletely imaged previous lumbar fixation. IMPRESSION: Limited intraoperative fluoroscopic spot images of thoracolumbar fixation/revision, with interval bilateral pedicle screws at T10, T11, T12, as above. Please refer to the dictated operative report for full details of intraoperative findings and procedure. Electronically Signed   By: Corrie Mckusick D.O.   On: 02/10/2018 12:24   Dg C-arm 1-60 Min  Result Date: 02/10/2018 CLINICAL DATA:   72 year old female with a history of revision spinal fixation. EXAM: LUMBAR SPINE - 2-3 VIEW; DG C-ARM 61-120 MIN COMPARISON:  CT 09/25/2017 FINDINGS: Limited intraoperative frontal and lateral view of thoracolumbar spine, with incompletely imaged hardware. Presuming the L1-L2 level is the level with a disc spacer and associated subsidence, as seen on prior CT, there are now postoperative changes of bilateral pedicle screws at T10, T11, T12. Surgical sponges project over the posterior elements of L1. Incompletely imaged previous lumbar fixation. IMPRESSION: Limited intraoperative fluoroscopic spot images of thoracolumbar fixation/revision, with interval bilateral pedicle screws at T10, T11, T12, as above. Please refer to the dictated operative report for full details of intraoperative findings and procedure. Electronically Signed   By: Corrie Mckusick D.O.   On: 02/10/2018 12:24    Assessment/Plan: Postoperative day 1 and T10 and L3 revision surgery doing very well mobilized today in her brace with physical and occupational therapy.  LOS: 1 day     Jung Yurchak P 02/11/2018, 7:12 AM

## 2018-02-11 NOTE — Progress Notes (Signed)
Orthopedic Tech Progress Note Patient Details:  Amy [name] January 15, 1946 [id number]  Patient ID: Amy [name], female   DOB: October 16, 1945, 72 y.o.   MRN: [id number]   [name] 02/11/2018, 10:41 AM Called in bio-tech brace order; spoke with Bella Kennedy

## 2018-02-11 NOTE — Evaluation (Signed)
Occupational Therapy Evaluation Patient Details Name: Amy [name] MRN: [id number] DOB: [birth year] Today's Date: 02/11/2018    History of Present Illness pt is a 72 y/o female with pmh significant for chronic back pain with fusion surgeries, neuropathy and PVD, admitted with worsening back pain and pseudoarthrosis at L1-L2.  pt s/p removal of L1-L3 hardware, pedical screws at T10-12 with tie in to ole L23 construct and PLA T10-L3.   Clinical Impression   Pt with decline in function and safety with ADLs and ADL mobility with decreased balance and endurance. Pt with hx of spinal surgeries and is familiar with DME. Pt educated on ADL A/E with handout provided. Pt would benefit from acute OT services to address impairments to maximize level of function and safety    Follow Up Recommendations  No OT follow up;Supervision/Assistance - 24 hour    Equipment Recommendations  Other (comment)(ADL A/E)    Recommendations for Other Services       Precautions / Restrictions Precautions Precautions: Back Precaution Booklet Issued: No Precaution Comments: reviewed back precautions and ADL A/E use with handouts provided Required Braces or Orthoses: Spinal Brace Spinal Brace: Other (comment)(for comfort) Restrictions Weight Bearing Restrictions: No      Mobility Bed Mobility Overal bed mobility: Needs Assistance Bed Mobility: Rolling;Sidelying to Sit Rolling: Min assist       Sit to sidelying: Min assist    Transfers Overall transfer level: Needs assistance Equipment used: Rolling walker (2 wheeled) Transfers: Sit to/from Stand Sit to Stand: Min assist              Balance Overall balance assessment: No apparent balance deficits (not formally assessed)                                         ADL either performed or assessed with clinical judgement   ADL Overall ADL's : Needs assistance/impaired Eating/Feeding: Set up;Sitting   Grooming:  Wash/dry hands;Wash/dry face;Min guard;Standing   Upper Body Bathing: Supervision/ safety;Set up;Sitting   Lower Body Bathing: Moderate assistance   Upper Body Dressing : Supervision/safety;Set up;Sitting   Lower Body Dressing: Moderate assistance                       Vision Baseline Vision/History: Wears glasses Wears Glasses: Reading only Patient Visual Report: No change from baseline       Perception     Praxis      Pertinent Vitals/Pain Pain Assessment: Faces Faces Pain Scale: Hurts even more Pain Location: back Pain Intervention(s): Limited activity within patient's tolerance;Monitored during session;Repositioned     Hand Dominance Right   Extremity/Trunk Assessment Upper Extremity Assessment Upper Extremity Assessment: Generalized weakness   Lower Extremity Assessment Lower Extremity Assessment: Defer to PT evaluation       Communication Communication Communication: No difficulties   Cognition Arousal/Alertness: Awake/alert Behavior During Therapy: WFL for tasks assessed/performed Overall Cognitive Status: Within Functional Limits for tasks assessed                                     General Comments       Exercises     Shoulder Instructions      Home Living Family/patient expects to be discharged to:: Private residence Living Arrangements: Spouse/significant other Available Help at Discharge: Family;Available 24 hours/day  Type of Home: House Home Access: Stairs to enter [employer] of Steps: 3 Entrance Stairs-Rails: Right;Left Home Layout: One level     Bathroom Shower/Tub: Occupational psychologist: Handicapped height Bathroom Accessibility: Yes   Home Equipment: [place] - single point;Shower seat          Prior Functioning/Environment Level of Independence: Independent                 OT Problem List: Decreased strength;Decreased activity tolerance;Decreased knowledge of use of DME  or AE;Impaired balance (sitting and/or standing);Decreased coordination;Pain      OT Treatment/Interventions: Self-care/ADL training;Therapeutic exercise;DME and/or AE instruction;Neuromuscular education;Therapeutic activities;Patient/family education    OT Goals(Current goals can be found in the care plan section) Acute Rehab OT Goals Patient Stated Goal: low pain, independence OT Goal Formulation: With patient Time For Goal Achievement: 02/25/18 Potential to Achieve Goals: Good ADL Goals Pt Will Perform Grooming: with set-up;with supervision;with caregiver independent in assisting Pt Will Perform Upper Body Bathing: with set-up;with caregiver independent in assisting Pt Will Perform Lower Body Bathing: with min assist;with caregiver independent in assisting Pt Will Perform Upper Body Dressing: with set-up;with caregiver independent in assisting Pt Will Perform Lower Body Dressing: with min assist;with caregiver independent in assisting Pt Will Transfer to Toilet: with min guard assist;with supervision;ambulating Pt Will Perform Toileting - Clothing Manipulation and hygiene: with min assist;sit to/from stand Pt Will Perform Tub/Shower Transfer: with min guard assist;with supervision;ambulating;3 in 1;shower seat  OT Frequency: Min 2X/week   Barriers to D/C:    no barriers       Co-evaluation              AM-PAC PT "6 Clicks" Daily Activity     Outcome Measure Help from another person eating meals?: None Help from another person taking care of personal grooming?: A Little Help from another person toileting, which includes using toliet, bedpan, or urinal?: A Lot Help from another person bathing (including washing, rinsing, drying)?: A Lot Help from another person to put on and taking off regular upper body clothing?: A Little Help from another person to put on and taking off regular lower body clothing?: A Lot 6 Click Score: 16   End of Session Equipment Utilized During  Treatment: Gait belt;Rolling walker  Activity Tolerance: Patient tolerated treatment well Patient left: in chair;with call bell/phone within reach  OT Visit Diagnosis: Unsteadiness on feet (R26.81);Other abnormalities of gait and mobility (R26.89);Muscle weakness (generalized) (M62.81);Pain Pain - part of body: (back)                Time: 2595-6387 OT Time Calculation (min): 42 min Charges:  OT General Charges $OT Visit: 1 Visit OT Evaluation $OT Eval Moderate Complexity: 1 Mod OT Treatments $Therapeutic Activity: 8-22 mins G-Codes: OT G-codes **NOT FOR INPATIENT CLASS** Functional Assessment Tool Used: AM-PAC 6 Clicks Daily Activity     Britt Bottom 02/11/2018, 2:37 PM

## 2018-02-12 LAB — BASIC METABOLIC PANEL
Anion gap: 8 (ref 5–15)
BUN: 22 mg/dL — ABNORMAL HIGH (ref 6–20)
CO2: 24 mmol/L (ref 22–32)
Calcium: 8.4 mg/dL — ABNORMAL LOW (ref 8.9–10.3)
Chloride: 104 mmol/L (ref 101–111)
Creatinine, Ser: 1.33 mg/dL — ABNORMAL HIGH (ref 0.44–1.00)
GFR calc Af Amer: 45 mL/min — ABNORMAL LOW (ref >60)
GFR calc non Af Amer: 39 mL/min — ABNORMAL LOW (ref >60)
Glucose, Bld: 133 mg/dL — ABNORMAL HIGH (ref 65–99)
Potassium: 4.1 mmol/L (ref 3.5–5.1)
Sodium: 136 mmol/L (ref 135–145)

## 2018-02-12 MED ORDER — VANCOMYCIN HCL 10 G IV SOLR
1500.0000 mg | INTRAVENOUS | Status: DC
  Administered 2018-02-12 – 2018-02-14 (×3): 1500 mg via INTRAVENOUS
  Filled 2018-02-12 (×5): qty 1500

## 2018-02-12 MED FILL — Sodium Chloride IV Soln 0.9%: INTRAVENOUS | Qty: 1000 | Status: AC

## 2018-02-12 MED FILL — Heparin Sodium (Porcine) Inj 1000 Unit/ML: INTRAMUSCULAR | Qty: 30 | Status: AC

## 2018-02-12 NOTE — Progress Notes (Signed)
Physical Therapy Treatment Patient Details Name: Amy [name] MRN: [id number] DOB: 12/22/1945 Today's Date: 02/12/2018    History of Present Illness pt is a 72 y/o female with pmh significant for chronic back pain with fusion surgeries, neuropathy and PVD, admitted with worsening back pain and pseudoarthrosis at L1-L2.  pt s/p removal of L1-L3 hardware, pedical screws at T10-12 with tie in to ole L23 construct and PLA T10-L3.    PT Comments    Progressing slowly.  Pain and lethargy limiting normalization of posture and gait.    Follow Up Recommendations  Supervision/Assistance - 24 hour;SNF;Other (comment)(pt having more trouble getting past the significant pain)     Equipment Recommendations  Rolling walker with 5" wheels    Recommendations for Other Services       Precautions / Restrictions Precautions Precautions: Back Precaution Comments: reviewed back precautions and ADL A/E use with handouts provided Required Braces or Orthoses: Spinal Brace Spinal Brace: Other (comment)    Mobility  Bed Mobility Overal bed mobility: Needs Assistance Bed Mobility: Rolling;Sidelying to Sit Rolling: Min assist Sidelying to sit: Mod assist       General bed mobility comments: cues to reinforce sequencing and truncal assist due to so much pain  Transfers Overall transfer level: Needs assistance Equipment used: Rolling walker (2 wheeled) Transfers: Sit to/from Stand Sit to Stand: Min assist;From elevated surface         General transfer comment: cues for hand placement and minimal assist with elevated surface.  Ambulation/Gait Ambulation/Gait assistance: Min assist Ambulation Distance (Feet): 120 Feet Assistive device: Rolling walker (2 wheeled) Gait Pattern/deviations: Step-through pattern Gait velocity: slower Gait velocity interpretation: <1.31 ft/sec, indicative of household ambulator General Gait Details: slow short tentative steps   Stairs             Wheelchair Mobility    Modified Rankin (Stroke Patients Only)       Balance Overall balance assessment: No apparent balance deficits (not formally assessed)                                          Cognition Arousal/Alertness: Awake/alert Behavior During Therapy: WFL for tasks assessed/performed Overall Cognitive Status: Within Functional Limits for tasks assessed                                        Exercises      General Comments General comments (skin integrity, edema, etc.): reinforced back care/prec, log roll/transitions  side to/from sitting, lifting restrictions, and progression of activity.      Pertinent Vitals/Pain Pain Assessment: 0-10 Pain Score: 7  Pain Location: back Pain Descriptors / Indicators: Operative site guarding;Grimacing Pain Intervention(s): Monitored during session    Home Living                      Prior Function            PT Goals (current goals can now be found in the care plan section) Acute Rehab PT Goals Patient Stated Goal: low pain, independence PT Goal Formulation: With patient Time For Goal Achievement: 02/17/18 Potential to Achieve Goals: Good Progress towards PT goals: Progressing toward goals    Frequency    Min 5X/week      PT Plan Current plan remains appropriate  Co-evaluation              AM-PAC PT "6 Clicks" Daily Activity  Outcome Measure  Difficulty turning over in bed (including adjusting bedclothes, sheets and blankets)?: Unable Difficulty moving from lying on back to sitting on the side of the bed? : Unable Difficulty sitting down on and standing up from a chair with arms (e.g., wheelchair, bedside commode, etc,.)?: Unable Help needed moving to and from a bed to chair (including a wheelchair)?: A Little Help needed walking in hospital room?: A Little Help needed climbing 3-5 steps with a railing? : A Lot 6 Click Score: 11    End of Session  Equipment Utilized During Treatment: Back brace Activity Tolerance: Patient tolerated treatment well Patient left: in chair;with call bell/phone within reach;with chair alarm set Nurse Communication: Mobility status PT Visit Diagnosis: Other abnormalities of gait and mobility (R26.89);Pain Pain - part of body: (back at incision)     Time: 1610-9604 PT Time Calculation (min) (ACUTE ONLY): 45 min  Charges:  $Gait Training: 8-22 mins $Therapeutic Activity: 8-22 mins $Self Care/Home Management: 8-22                    G Codes:       03/14/2018  Donnella Sham, PT [phone]  (pager)   [name] 03-14-2018, 11:18 AM

## 2018-02-12 NOTE — Progress Notes (Signed)
Subjective: Patient reports Patient doing well condition of back pain no radicular symptoms  Objective: Vital signs in last 24 hours: Temp:  [98.1 F (36.7 C)-99.1 F (37.3 C)] 98.5 F (36.9 C) (05/22 0829) Pulse Rate:  [73-79] 74 (05/22 0829) Resp:  [18] 18 (05/22 0433) BP: (116-131)/(59-68) 119/65 (05/22 0829) SpO2:  [94 %-96 %] 95 % (05/22 0829)  Intake/Output from previous day: 05/21 0701 - 05/22 0700 In: 600 [P.O.:600] Out: 390 [Urine:150; Drains:240] Intake/Output this shift: Total I/O In: 100 [Other:100] Out: -   awake alert oriented strength 5 out of 5 wound clean dry and intact  Lab Results: No results for input(s): WBC, HGB, HCT, PLT in the last 72 hours. BMET Recent Labs    02/12/18 0209  NA 136  K 4.1  CL 104  CO2 24  GLUCOSE 133*  BUN 22*  CREATININE 1.33*  CALCIUM 8.4*    Studies/Results: Dg Thoracolumabar Spine  Result Date: 02/10/2018 CLINICAL DATA:  72 year old female with a history of revision spinal fixation. EXAM: LUMBAR SPINE - 2-3 VIEW; DG C-ARM 61-120 MIN COMPARISON:  CT 09/25/2017 FINDINGS: Limited intraoperative frontal and lateral view of thoracolumbar spine, with incompletely imaged hardware. Presuming the L1-L2 level is the level with a disc spacer and associated subsidence, as seen on prior CT, there are now postoperative changes of bilateral pedicle screws at T10, T11, T12. Surgical sponges project over the posterior elements of L1. Incompletely imaged previous lumbar fixation. IMPRESSION: Limited intraoperative fluoroscopic spot images of thoracolumbar fixation/revision, with interval bilateral pedicle screws at T10, T11, T12, as above. Please refer to the dictated operative report for full details of intraoperative findings and procedure. Electronically Signed   By: Corrie Mckusick D.O.   On: 02/10/2018 12:24   Dg C-arm 1-60 Min  Result Date: 02/10/2018 CLINICAL DATA:  72 year old female with a history of revision spinal fixation. EXAM:  LUMBAR SPINE - 2-3 VIEW; DG C-ARM 61-120 MIN COMPARISON:  CT 09/25/2017 FINDINGS: Limited intraoperative frontal and lateral view of thoracolumbar spine, with incompletely imaged hardware. Presuming the L1-L2 level is the level with a disc spacer and associated subsidence, as seen on prior CT, there are now postoperative changes of bilateral pedicle screws at T10, T11, T12. Surgical sponges project over the posterior elements of L1. Incompletely imaged previous lumbar fixation. IMPRESSION: Limited intraoperative fluoroscopic spot images of thoracolumbar fixation/revision, with interval bilateral pedicle screws at T10, T11, T12, as above. Please refer to the dictated operative report for full details of intraoperative findings and procedure. Electronically Signed   By: Corrie Mckusick D.O.   On: 02/10/2018 12:24    Assessment/Plan: Postop day 2 T10-L3 revision doing well continue to mobilize with physical and occupational therapy work on discharge planning  LOS: 2 days     CRAM,GARY P 02/12/2018, 10:05 AM

## 2018-02-12 NOTE — Progress Notes (Signed)
Pharmacy Antibiotic Note  Amy [name] is a 72 y.o. female admitted on 02/10/2018 for procedure for exploration of prior lumbar fusion. Pt has a documented penicillin allergy but a note in her chart suggests she has taken and tolerated amoxicillin. Patient does have a lumbar drain; therefore, will continue vancomycin for a duration of the physicians discretion. No culture data. SCr has increased from baseline of 0.9 to 1.33.    Plan: Change vancomycin to 1500mg  IV Q24H F/u renal fxn, C&S, clinical status and LOT   Rumbarger, Rande Lawman 02/12/2018 12:08 PM

## 2018-02-12 NOTE — Progress Notes (Signed)
Occupational Therapy Treatment Patient Details Name: Amy [name] MRN: [id number] DOB: 1946/04/12 Today's Date: 02/12/2018    History of present illness pt is a 72 y/o female with pmh significant for chronic back pain with fusion surgeries, neuropathy and PVD, admitted with worsening back pain and pseudoarthrosis at L1-L2.  pt s/p removal of L1-L3 hardware, pedical screws at T10-12 with tie in to ole L23 construct and PLA T10-L3.   OT comments  Pt slowly progressing towards OT goals, Pt required mod A for boost for sit <>stand and max A for peri care with [facility] transfer. Pt is still requiring significant assist for BADL and so dc recommendation changed to SNF to maximize safety and independence - and increased need for support. Next session to focus on AE practice and transfers with RW.    Follow Up Recommendations  SNF;Supervision/Assistance - 24 hour    Equipment Recommendations  Other (comment)(defer to next venue of care)    Recommendations for Other Services      Precautions / Restrictions Precautions Precautions: Back Precaution Comments: reviewed back precautions and ADL A/E use with handouts provided Required Braces or Orthoses: Spinal Brace Restrictions Weight Bearing Restrictions: No       Mobility Bed Mobility Overal bed mobility: Needs Assistance Bed Mobility: Rolling;Sidelying to Sit Rolling: Min assist Sidelying to sit: Mod assist     Sit to sidelying: Mod assist General bed mobility comments: +2 helpful; cues to reinforce sequencing and truncal assist due to so much pain; mod A for BLE back into bed  Transfers Overall transfer level: Needs assistance Equipment used: Rolling walker (2 wheeled) Transfers: Sit to/from Stand Sit to Stand: From elevated surface;Mod assist         General transfer comment: cues for hand placement and mod A for boost    Balance Overall balance assessment: No apparent balance deficits (not formally assessed)                                          ADL either performed or assessed with clinical judgement   ADL Overall ADL's : Needs assistance/impaired     Grooming: Wash/dry hands;Set up;Bed level                   Toilet Transfer: Moderate assistance;Stand-pivot;BSC;RW Toilet Transfer Details (indicate cue type and reason): mod A for boost, increased time required, vc for safe hand placement Toileting- Clothing Manipulation and Hygiene: Maximal assistance;Sit to/from stand Toileting - Clothing Manipulation Details (indicate cue type and reason): Pt able to maintain standing for OT to perform peri care with warm wash cloth             Vision       Perception     Praxis      Cognition Arousal/Alertness: Awake/alert Behavior During Therapy: WFL for tasks assessed/performed Overall Cognitive Status: Within Functional Limits for tasks assessed                                          Exercises     Shoulder Instructions       General Comments reinforced back precautions throughout session    Pertinent Vitals/ Pain       Pain Assessment: 0-10 Pain Score: 5  Pain Location: back Pain Descriptors / Indicators:  Operative site guarding;Grimacing Pain Intervention(s): Monitored during session;Repositioned;Limited activity within patient's tolerance  Home Living                                          Prior Functioning/Environment              Frequency  Min 2X/week        Progress Toward Goals  OT Goals(current goals can now be found in the care plan section)  Progress towards OT goals: Progressing toward goals(limited)  Acute Rehab OT Goals Patient Stated Goal: get better OT Goal Formulation: With patient Time For Goal Achievement: 02/25/18 Potential to Achieve Goals: Good  Plan Discharge plan needs to be updated;Frequency remains appropriate    Co-evaluation                 AM-PAC PT "6  Clicks" Daily Activity     Outcome Measure   Help from another person eating meals?: None Help from another person taking care of personal grooming?: A Little Help from another person toileting, which includes using toliet, bedpan, or urinal?: A Lot Help from another person bathing (including washing, rinsing, drying)?: A Lot Help from another person to put on and taking off regular upper body clothing?: A Lot Help from another person to put on and taking off regular lower body clothing?: A Lot 6 Click Score: 15    End of Session Equipment Utilized During Treatment: Gait belt;Rolling walker  OT Visit Diagnosis: Unsteadiness on feet (R26.81);Other abnormalities of gait and mobility (R26.89);Muscle weakness (generalized) (M62.81);Pain Pain - part of body: (back)   Activity Tolerance Patient tolerated treatment well   Patient Left in bed;with call bell/phone within reach;with bed alarm set;with SCD's reapplied   Nurse Communication Mobility status;Precautions        Time: 1275-1700 OT Time Calculation (min): 28 min  Charges: OT General Charges $OT Visit: 1 Visit OT Treatments $Self Care/Home Management : 23-37 mins  Hulda Humphrey OTR/L [place] 02/12/2018, 5:49 PM

## 2018-02-12 NOTE — Clinical Social Work Note (Signed)
Clinical Social Work Assessment  Patient Details  Name: Amy [name] MRN: [id number] Date of Birth: [birth year]  Date of referral:  02/12/18               Reason for consult:  Facility Placement, Discharge Planning                Permission sought to share information with:  Facility Sport and exercise psychologist, Family Supports Permission granted to share information::  Yes, Verbal Permission Granted  Name::     Amy [name]  Agency::  [employer]; SNFs  Relationship::  husband  Contact Information:    [phone]  Housing/Transportation Living arrangements for the past 2 months:  Single Family Home Source of Information:  Patient Patient Interpreter Needed:  None Criminal Activity/Legal Involvement Pertinent to Current Situation/Hospitalization:  No - Comment as needed Significant Relationships:  Friend, Warehouse manager, Other Family Members, Spouse Lives with:  Spouse Do you feel safe going back to the place where you live?  Yes Need for family participation in patient care:  Yes (Comment)  Care giving concerns:  Pt states she has had multiple surgical interventions on her back and spine, pt states that she lives at home with her husband however per care team her husband is unable to provide 24/7 support. Pt states that her husband works at [employer], and she is currently requiring assistance with mobility as well as pain control.    Social Worker assessment / plan:  CSW met with pt post PT evaluation, pt appeared lethargic (eyes were open and closed several times during the assessment). CSW asked if pt wanted CSW to return, pt stated she could talk then. Pt states she lives in [place] with her husband. She has friends who come by and visit and can help but feels she would like a SNF. Pt nodded when asked if she had been to [facility] in the past, then stated "[facility] or somewhere near [place]."  CSW will send out pt therapy notes and FL2 and return with  options. Pt needs another evening to meet medically qualifying 3 night stay.   Employment status:  Retired Forensic scientist:  Medicare(& Agricultural consultant) PT Recommendations:  [place], [place] / Referral to community resources:  [place]  Patient/Family's Response to care:  Despite lethargy pt was amenable to speaking with CSW and was interested in SNF referrals being made.  Patient/Family's Understanding of and Emotional Response to Diagnosis, Current Treatment, and Prognosis:  Pt states understanding of diagnosis, current treatment and prognosis. As previously noted this is one of multiple procdeures that the pt has had on her back. Pt states understanding of needs and limitations as she heals and manages pain post operatively.   Emotional Assessment Appearance:  Appears stated age Attitude/Demeanor/Rapport:  Lethargic, Gracious Affect (typically observed):  Accepting, Quiet, Pleasant(Lethargic) Orientation:  Oriented to Self, Oriented to Place, Oriented to  Time, Oriented to Situation Alcohol / Substance use:  Not Applicable Psych involvement (Current and /or in the community):     Discharge Needs  Concerns to be addressed:  Discharge Planning Concerns, Care Coordination Readmission within the last 30 days:  No Current discharge risk:  Physical Impairment, Dependent with Mobility Barriers to Discharge:  Continued Medical Work up, [employer], [place] 02/12/2018, 11:42 AM

## 2018-02-12 NOTE — [place] Notes (Addendum)
Pt lethargic most of the day after flexeril and 10 mg of oxy were given this a.m before PT came to walk her Patient is a & o x's 4 Patient has no change in LOC Patient walked again with Nurse Tech this afternoon right before 1700, was sluggish in her gait (used a walker) Patient now complaining of pain in her right thigh related to "bursitis" Ice pack applied there and to her lower back on the right side. Patient repositioned. Strong radial and dorsal pedis pulses Clear lungs to auscultate and regular heart sounds In speaking with patient she states that "at home I dont get any rest and I stay awake at night". Pt states that she feels "like her body is catching up on her rest" Patient in room with her husband, up in bed, and trying to eat.

## 2018-02-12 NOTE — [place] FL2 (Addendum)
[place] MEDICAID FL2 LEVEL OF CARE SCREENING TOOL     IDENTIFICATION  Patient Name: Amy [name] Birthdate: 1946-06-10 Sex: female Admission Date (Current Location): 02/10/2018  [facility] and [place] Number:  Herbalist and Address:  [facility] [place], [place] [address] [place], [place] [address]      Provider Number: [id number]  Attending Physician Name and Address:  Kary Kos, MD  Relative Name and Phone Number:  Lissa Merlin (husband) [phone]    Current Level of Care: Hospital Recommended Level of Care: [place] Prior Approval Number:    Date Approved/Denied:   PASRR Number: [id number]  Discharge Plan: SNF    Current Diagnoses: Patient Active Problem List   Diagnosis Date Noted  . Wound dehiscence 07/22/2016  . Dyspnea 07/21/2016  . Acute encephalopathy 07/21/2016  . Anemia 07/21/2016  . Pancreatitis 07/02/2016  . SBO (small bowel obstruction) ([place]) 07/02/2016  . Long term current use of opiate analgesic 06/26/2016  . Long term prescription opiate use 06/26/2016  . Opiate use 06/26/2016  . Encounter for therapeutic drug level monitoring 06/26/2016  . Encounter for pain management planning 06/26/2016  . Chronic pain 06/26/2016  . Disturbance of skin sensation 06/26/2016  . Chronic low back pain (Location of Primary Source of Pain) (Bilateral) (R>L) 06/26/2016  . Chronic hip pain (Location of Secondary source of pain) (Bilateral) (R>L) 06/26/2016  . 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 Chronic shoulder pain (Location of Tertiary source of pain) (Bilateral) (R>L) 06/26/2016  . 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 Chronic knee pain (Bilateral) (R>L) 06/26/2016  . 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 Chronic neck pain (Right) 06/26/2016  . Lumbar spondylosis 06/26/2016  . Lumbar facet syndrome 06/26/2016  . Lumbar facet hypertrophy 06/26/2016  .  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 Neurogenic pain 06/26/2016  . Occipital headaches 06/26/2016  . 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 History of lumbar fusion 06/26/2016  . 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  . Neuropathic pain of both legs 10/13/2015  . Constipation 10/13/2015  . Complete rotator cuff tear of left shoulder 05/05/2014  . Depression 08/06/2013  . Hypokalemia 08/06/2013  . GERD (gastroesophageal reflux disease) 08/06/2013  . Spinal stenosis of lumbar region 08/06/2013  . Bladder spasm 08/06/2013  . Obesity, Class III, BMI 40-49.9 (morbid obesity) ([place]) 02/03/2013  . Lipoma of buttock s/p excision 02/20/2013 02/03/2013  . DYSPNEA 01/23/2008  . Obstructive sleep apnea 09/12/2007  . HTN (hypertension) 09/12/2007  . Allergic rhinitis 09/12/2007  . SLEEPINESS 09/12/2007  . ANGINA, HX OF 09/12/2007    Orientation RESPIRATION BLADDER Height & Weight     Self, Time, Situation, Place  Normal Continent Weight: 270 lb (122.5 kg) Height:  5' 5.5" (166.4 cm)  BEHAVIORAL SYMPTOMS/MOOD NEUROLOGICAL BOWEL NUTRITION STATUS      Continent Diet(see discharge summary)  AMBULATORY STATUS COMMUNICATION OF NEEDS Skin   Limited Assist Verbally Surgical wounds (incision on back with gauze dressings PRN)                       Personal Care Assistance Level of Assistance  Bathing, Feeding, Dressing Bathing Assistance: Maximum assistance Feeding assistance: Independent Dressing Assistance: Limited assistance     Functional Limitations Info  Sight, Hearing, Speech Sight Info: Adequate Hearing Info: Adequate Speech Info: Adequate    SPECIAL CARE FACTORS FREQUENCY  PT (By licensed PT), OT (By licensed  OT)     PT Frequency: 5x week OT Frequency: 5x week            Contractures Contractures Info: Not present    Additional Factors Info  Code Status, Allergies, Psychotropic Code Status Info: Full Code Allergies  Info: LATEX, PENICILLINS, TOMATO  Psychotropic Info: PARoxetine (PAXIL) tablet 30 mg daily PO         Current Medications (02/12/2018):  This is the current hospital active medication list Current Facility-Administered Medications  Medication Dose Route Frequency Provider Last Rate Last Dose  . 0.9 %  sodium chloride infusion  250 mL Intravenous Continuous Kary Kos, MD      . acetaminophen (TYLENOL) tablet 650 mg  650 mg Oral Q4H PRN Kary Kos, MD       Or  . acetaminophen (TYLENOL) suppository 650 mg  650 mg Rectal Q4H PRN Kary Kos, MD      . acetaminophen (TYLENOL) tablet 1,000 mg  1,000 mg Oral BID PRN Kary Kos, MD      . acidophilus (RISAQUAD) capsule   Oral Daily Kary Kos, MD   1 capsule at 02/12/18 0946  . alum & mag hydroxide-simeth (MAALOX/MYLANTA) 200-200-20 MG/5ML suspension 30 mL  30 mL Oral Q6H PRN Kary Kos, MD      . calcium-vitamin D (OSCAL WITH D) 500-200 MG-UNIT per tablet 1 tablet  1 tablet Oral Q breakfast Kary Kos, MD   1 tablet at 02/12/18 0945  . cloNIDine (CATAPRES) tablet 0.1 mg  0.1 mg Oral TID Kary Kos, MD   0.1 mg at 02/12/18 0945  . cyclobenzaprine (FLEXERIL) tablet 10 mg  10 mg Oral TID PRN Kary Kos, MD   10 mg at 02/12/18 0945  . diphenhydrAMINE (BENADRYL) tablet 25 mg  25 mg Oral BID PRN Kary Kos, MD      . estradiol (ESTRACE) vaginal cream 1 Applicatorful  1 Applicatorful Vaginal Daily PRN Kary Kos, MD      . fluticasone (FLONASE) 50 MCG/ACT nasal spray 1 spray  1 spray Each Nare Daily PRN Kary Kos, MD      . gabapentin (NEURONTIN) capsule 300 mg  300 mg Oral BID Kary Kos, MD   300 mg at 02/12/18 0946  . hydrALAZINE (APRESOLINE) tablet 50 mg  50 mg Oral TID WC Kary Kos, MD   50 mg at 02/12/18 0945  . HYDROmorphone (DILAUDID) injection 0.5 mg  0.5 mg Intravenous Q2H PRN Kary Kos, MD   0.5 mg at 02/11/18 1544  . lactated ringers infusion   Intravenous Continuous Effie Berkshire, MD 10 mL/hr at 02/10/18 [phone]    .  menthol-cetylpyridinium (CEPACOL) lozenge 3 mg  1 lozenge Oral PRN Kary Kos, MD       Or  . phenol (CHLORASEPTIC) mouth spray 1 spray  1 spray Mouth/Throat PRN Kary Kos, MD      . metoprolol tartrate (LOPRESSOR) tablet 50 mg  50 mg Oral BID Kary Kos, MD   50 mg at 02/12/18 0946  . multivitamin with minerals tablet   Oral Daily Kary Kos, MD   1 tablet at 02/12/18 0946  . MUSCLE RUB CREA   Topical PRN Kary Kos, MD      . ondansetron [facility]) tablet 4 mg  4 mg Oral Q6H PRN Kary Kos, MD       Or  . ondansetron [facility]) injection 4 mg  4 mg Intravenous Q6H PRN Kary Kos, MD      . ondansetron [facility]) tablet 4 mg  4 mg Oral Q6H PRN Kary Kos, MD      . oxyCODONE (Oxy IR/ROXICODONE) immediate release tablet 10 mg  10 mg Oral Q3H PRN Kary Kos, MD   10 mg at 02/12/18 0945  . pantoprazole (PROTONIX) EC tablet 40 mg  40 mg Oral Daily Kary Kos, MD   40 mg at 02/12/18 0946  . PARoxetine (PAXIL) tablet 30 mg  30 mg Oral Daily Kary Kos, MD   30 mg at 02/12/18 0946  . polyethylene glycol (MIRALAX / GLYCOLAX) packet 17 g  17 g Oral Daily PRN Kary Kos, MD      . polyvinyl alcohol (LIQUIFILM TEARS) 1.4 % ophthalmic solution 1 drop  1 drop Both Eyes BID PRN Kary Kos, MD      . sodium chloride flush (NS) 0.9 % injection 3 mL  3 mL Intravenous Q12H Kary Kos, MD   3 mL at 02/11/18 2321  . sodium chloride flush (NS) 0.9 % injection 3 mL  3 mL Intravenous PRN Kary Kos, MD   3 mL at 02/11/18 0928  . tapentadol (NUCYNTA) tablet 50 mg  50 mg Oral Q6H PRN Kary Kos, MD      . vancomycin (VANCOCIN) IVPB 1000 mg/200 mL premix  1,000 mg Intravenous Q12H Harvel Quale, [facility]   Stopped at 02/11/18 2125  . vitamin B-12 (CYANOCOBALAMIN) tablet 1,000 mcg  1,000 mcg Oral Daily Kary Kos, MD   1,000 mcg at 02/12/18 0946  . vitamin E capsule 400 Units  400 Units Oral Daily Kary Kos, MD   400 Units at 02/12/18 0165     Discharge Medications: Please see discharge summary for a list of discharge  medications.  Relevant Imaging Results:  Relevant Lab Results:   Additional Information [id number] [place], [place]

## 2018-02-13 LAB — CBC
HCT: 35.4 % — ABNORMAL LOW (ref 36.0–46.0)
Hemoglobin: 11.3 g/dL — ABNORMAL LOW (ref 12.0–15.0)
MCH: 29.7 pg (ref 26.0–34.0)
MCHC: 31.9 g/dL (ref 30.0–36.0)
MCV: 92.9 fL (ref 78.0–100.0)
Platelets: 274 10*3/uL (ref 150–400)
RBC: 3.81 MIL/uL — ABNORMAL LOW (ref 3.87–5.11)
RDW: 15.1 % (ref 11.5–15.5)
WBC: 14.7 10*3/uL — ABNORMAL HIGH (ref 4.0–10.5)

## 2018-02-13 LAB — URINALYSIS, ROUTINE W REFLEX MICROSCOPIC
Bilirubin Urine: NEGATIVE
Glucose, UA: NEGATIVE mg/dL
Hgb urine dipstick: NEGATIVE
Ketones, ur: NEGATIVE mg/dL
Leukocytes, UA: NEGATIVE
Nitrite: NEGATIVE
Protein, ur: NEGATIVE mg/dL
Specific Gravity, Urine: 1.014 (ref 1.005–1.030)
pH: 6 (ref 5.0–8.0)

## 2018-02-13 LAB — BASIC METABOLIC PANEL
Anion gap: 10 (ref 5–15)
BUN: 21 mg/dL — ABNORMAL HIGH (ref 6–20)
CO2: 22 mmol/L (ref 22–32)
Calcium: 8.5 mg/dL — ABNORMAL LOW (ref 8.9–10.3)
Chloride: 105 mmol/L (ref 101–111)
Creatinine, Ser: 1.24 mg/dL — ABNORMAL HIGH (ref 0.44–1.00)
GFR calc Af Amer: 49 mL/min — ABNORMAL LOW (ref >60)
GFR calc non Af Amer: 42 mL/min — ABNORMAL LOW (ref >60)
Glucose, Bld: 100 mg/dL — ABNORMAL HIGH (ref 65–99)
Potassium: 4.7 mmol/L (ref 3.5–5.1)
Sodium: 137 mmol/L (ref 135–145)

## 2018-02-13 MED ORDER — SODIUM CHLORIDE 0.9 % IV SOLN
INTRAVENOUS | Status: DC
  Administered 2018-02-13 – 2018-02-15 (×3): via INTRAVENOUS

## 2018-02-13 NOTE — [place] Notes (Signed)
Occupational Therapy Treatment Patient Details Name: Amy [name] MRN: [id number] DOB: Dec 06, 1945 Today's Date: 02/13/2018    History of present illness pt is a 72 y/o female with pmh significant for chronic back pain with fusion surgeries, neuropathy and PVD, admitted with worsening back pain and pseudoarthrosis at L1-L2.  pt s/p removal of L1-L3 hardware, pedical screws at T10-12 with tie in to ole L23 construct and PLA T10-L3.   OT comments  Pt seen with PT due to increased pain.  She required mod A +2 to occasional max A +2 for functional mobility and max a for ADLs.   Continue to recommend SNF.  Pain 8/10 today.    Follow Up Recommendations  SNF;Supervision/Assistance - 24 hour    Equipment Recommendations       Recommendations for Other Services      Precautions / Restrictions Precautions Precautions: Back Precaution Comments: reviewed back precautions and ADL A/E use with handouts provided Required Braces or Orthoses: Spinal Brace Spinal Brace: Other (comment)       Mobility Bed Mobility Overal bed mobility: Needs Assistance Bed Mobility: Sit to Sidelying         Sit to sidelying: Mod assist General bed mobility comments: more painful and needing more LE assist  Transfers Overall transfer level: Needs assistance Equipment used: Rolling walker (2 wheeled) Transfers: Sit to/from Stand Sit to Stand: Mod assist;Max assist;+2 physical assistance(max from lower surface, mod from [facility])         General transfer comment: cues for hand placement and more assist than other days due to increased pain    Balance Overall balance assessment: Needs assistance   Sitting balance-Leahy Scale: Fair Sitting balance - Comments: fair statically, but doesn't move well toward away from and back toward midline due to painl   Standing balance support: Bilateral upper extremity supported Standing balance-Leahy Scale: Poor Standing balance comment: reliant on the RW for  support due to pain.                           ADL either performed or assessed with clinical judgement   ADL Overall ADL's : Needs assistance/impaired             Lower Body Bathing: Moderate assistance           Toilet Transfer: Maximal assistance;+2 for physical assistance;Moderate assistance;Ambulation;BSC;RW Toilet Transfer Details (indicate cue type and reason): assist to power up into standing  Toileting- Clothing Manipulation and Hygiene: Maximal assistance;Sit to/from stand       Functional mobility during ADLs: Moderate assistance;Maximal assistance;+2 for safety/equipment;+2 for physical assistance;Rolling walker       Vision       Perception     Praxis      Cognition Arousal/Alertness: Awake/alert Behavior During Therapy: WFL for tasks assessed/performed Overall Cognitive Status: Within Functional Limits for tasks assessed                                 General Comments: pt lethargic initially, but started to arouse and engage within 15 minutes of session start.        Exercises     Shoulder Instructions       General Comments pt limited by pain this session     Pertinent Vitals/ Pain       Pain Assessment: Faces Faces Pain Scale: Hurts whole lot Pain Location: back Pain Descriptors /  Indicators: Operative site guarding;Grimacing;Moaning Pain Intervention(s): Monitored during session  Home Living                                          Prior Functioning/Environment              Frequency  Min 2X/week        Progress Toward Goals  OT Goals(current goals can now be found in the care plan section)  Progress towards OT goals: Progressing toward goals  Acute Rehab OT Goals Patient Stated Goal: get better  Plan Discharge plan remains appropriate    Co-evaluation    PT/OT/SLP Co-Evaluation/Treatment: Yes Reason for Co-Treatment: For patient/therapist safety;To address  functional/ADL transfers PT goals addressed during session: Mobility/safety with mobility OT goals addressed during session: ADL's and self-care      AM-PAC PT "6 Clicks" Daily Activity     Outcome Measure   Help from another person eating meals?: None Help from another person taking care of personal grooming?: A Little Help from another person toileting, which includes using toliet, bedpan, or urinal?: A Lot Help from another person bathing (including washing, rinsing, drying)?: A Lot Help from another person to put on and taking off regular upper body clothing?: A Lot Help from another person to put on and taking off regular lower body clothing?: A Lot 6 Click Score: 15    End of Session Equipment Utilized During Treatment: Rolling walker;Back brace  OT Visit Diagnosis: Unsteadiness on feet (R26.81);Other abnormalities of gait and mobility (R26.89);Muscle weakness (generalized) (M62.81);Pain   Activity Tolerance Patient limited by pain   Patient Left in bed;with call bell/phone within reach;with family/visitor present   Nurse Communication Mobility status;Patient requests pain meds        Time: 5093-2671 OT Time Calculation (min): 32 min  Charges: OT General Charges $OT Visit: 1 Visit OT Treatments $Self Care/Home Management : 8-22 mins  Bessie, OTR/L [phone]    Lucille Passy M 02/13/2018, 5:31 PM

## 2018-02-13 NOTE — [place] Notes (Signed)
NP came to assess patient. All pain medications are discontinue.

## 2018-02-13 NOTE — Care Management Important Message (Signed)
Important Message  Patient Details  Name: Amy [name] MRN: [id number] Date of Birth: [birth year]   Medicare Important Message Given:  Yes    [name] Circle 02/13/2018, 10:44 AM

## 2018-02-13 NOTE — [place] Notes (Signed)
Physical Therapy Treatment Patient Details Name: Amy [name] MRN: [id number] DOB: 05-Mar-1946 Today's Date: 02/13/2018    History of Present Illness pt is a 72 y/o female with pmh significant for chronic back pain with fusion surgeries, neuropathy and PVD, admitted with worsening back pain and pseudoarthrosis at L1-L2.  pt s/p removal of L1-L3 hardware, pedical screws at T10-12 with tie in to ole L23 construct and PLA T10-L3.    PT Comments    Pt more painful, frequently lethargic, but arousable.  Emphasis on sit to stand, transfer safety and gait.    Follow Up Recommendations  Supervision/Assistance - 24 hour;SNF;Other (comment)     Equipment Recommendations  Rolling walker with 5" wheels    Recommendations for Other Services       Precautions / Restrictions Precautions Precautions: Back Precaution Comments: reviewed back precautions and ADL A/E use with handouts provided Required Braces or Orthoses: Spinal Brace Spinal Brace: Other (comment)    Mobility  Bed Mobility Overal bed mobility: Needs Assistance Bed Mobility: Sit to Sidelying         Sit to sidelying: Mod assist General bed mobility comments: more painful and needing more LE assist  Transfers Overall transfer level: Needs assistance Equipment used: Rolling walker (2 wheeled) Transfers: Sit to/from Stand Sit to Stand: Mod assist;Max assist;+2 physical assistance(max from lower surface, mod from [facility])         General transfer comment: cues for hand placement and more assist than other days due to increased pain  Ambulation/Gait Ambulation/Gait assistance: Min assist Ambulation Distance (Feet): 8 Feet(to bathroom and 10 feet back to the bed.) Assistive device: Rolling walker (2 wheeled) Gait Pattern/deviations: Step-through pattern Gait velocity: slower   General Gait Details: slow short tentative steps, flexed posture   Stairs             Wheelchair Mobility    Modified  [place] (Stroke Patients Only)       Balance Overall balance assessment: Needs assistance   Sitting balance-Leahy Scale: Fair Sitting balance - Comments: fair statically, but doesn't move well toward away from and back toward midline due to painl   Standing balance support: Bilateral upper extremity supported Standing balance-Leahy Scale: Poor Standing balance comment: reliant on the RW for support due to pain.                            Cognition Arousal/Alertness: Awake/alert Behavior During Therapy: WFL for tasks assessed/performed Overall Cognitive Status: Within Functional Limits for tasks assessed                                 General Comments: pt lethargic initially, but started to arouse and engage within 15 minutes of session start.      Exercises      General Comments General comments (skin integrity, edema, etc.): Continue to reinforced back prec/care and progression of activity.      Pertinent Vitals/Pain Pain Assessment: Faces Faces Pain Scale: Hurts whole lot Pain Location: back Pain Descriptors / Indicators: Operative site guarding;Grimacing;Moaning Pain Intervention(s): Monitored during session    Home Living                      Prior Function            PT Goals (current goals can now be found in the care plan section) Acute Rehab PT  Goals Patient Stated Goal: get better PT Goal Formulation: With patient Time For Goal Achievement: 02/17/18 Potential to Achieve Goals: Good Progress towards PT goals: Progressing toward goals    Frequency    Min 5X/week      PT Plan Current plan remains appropriate    Co-evaluation PT/OT/SLP Co-Evaluation/Treatment: Yes Reason for Co-Treatment: For patient/therapist safety PT goals addressed during session: Mobility/safety with mobility        AM-PAC PT "6 Clicks" Daily Activity  Outcome Measure  Difficulty turning over in bed (including adjusting bedclothes,  sheets and blankets)?: Unable Difficulty moving from lying on back to sitting on the side of the bed? : Unable Difficulty sitting down on and standing up from a chair with arms (e.g., wheelchair, bedside commode, etc,.)?: Unable Help needed moving to and from a bed to chair (including a wheelchair)?: A Lot Help needed walking in hospital room?: A Little Help needed climbing 3-5 steps with a railing? : A Lot 6 Click Score: 10    End of Session Equipment Utilized During Treatment: Back brace Activity Tolerance: Patient tolerated treatment well Patient left: in bed;with call bell/phone within reach Nurse Communication: Mobility status PT Visit Diagnosis: Other abnormalities of gait and mobility (R26.89);Pain Pain - part of body: (back)     Time: 9518-8416 PT Time Calculation (min) (ACUTE ONLY): 32 min  Charges:  $Therapeutic Activity: 8-22 mins                    G Codes:       14-Feb-2018  Donnella Sham, PT [phone]  (pager)   [name] 02/14/18, 4:48 PM

## 2018-02-13 NOTE — [place] Notes (Signed)
Subjective: Patient reports sleepy and feels weak. Alert and oriented. Nurse concerned that she is acting differently than the last two days. More difficult to ambulate with PT.  Objective: Vital signs in last 24 hours: Temp:  [97.9 F (36.6 C)-98.9 F (37.2 C)] 98.8 F (37.1 C) (05/23 0434) Pulse Rate:  [72-87] 83 (05/23 0434) Resp:  [1-15] 1 (05/22 2354) BP: (119-180)/(54-76) 143/54 (05/23 0434) SpO2:  [95 %-100 %] 96 % (05/23 0434)  Intake/Output from previous day: 05/22 0701 - 05/23 0700 In: 1564 [P.O.:480; I.V.:484; IV Piggyback:500] Out: 135 [Drains:135] Intake/Output this shift: No intake/output data recorded.  Neurologic: Grossly normal  Lab Results: Lab Results  Component Value Date   WBC 3.9 (L) 02/04/2018   HGB 13.7 02/04/2018   HCT 42.2 02/04/2018   MCV 91.3 02/04/2018   PLT 322 02/04/2018   Lab Results  Component Value Date   INR 1.08 07/03/2016   BMET Lab Results  Component Value Date   NA 137 02/13/2018   K 4.7 02/13/2018   CL 105 02/13/2018   CO2 22 02/13/2018   GLUCOSE 100 (H) 02/13/2018   BUN 21 (H) 02/13/2018   CREATININE 1.24 (H) 02/13/2018   CALCIUM 8.5 (L) 02/13/2018    Studies/Results: No results found.  Assessment/Plan: I believe that she may be getting overmedicated. We will stop pain meds. Creatinine has increased. Will check CBC   LOS: 3 days    Ocie Cornfield [facility] 02/13/2018, 7:58 AM

## 2018-02-13 NOTE — Progress Notes (Signed)
In report Nurse is told that patient had a drastic decrease in function over the night. Nurse was told patient had one occurrence of green sputum overnight and patient just had another occurrence of light green sputum.  Upon assessment patient complains of increased weakness of bilateral upper extremities since the day before yesterday. Yesterday patient only complained of pain in her right thigh/leg area related to bursitis. Patient admitted to not expressing this to her Nurse when it began or before this morning's assessment. Patient is now incontinent of urine with a soiled bed pad. Patient has increased lethargy to the point of not being able to keep her eyes open. Patient is barely able to keep herself sitting up on the side of the bed.  Patient is alert and oriented x's 4.  Vitals: 98.9 146/65 (88) 96% and pulse of 79. Patient has diminished lungs sounds anterior and posterior but also a weak inspiration when asked to fill her lungs.  [place]  Answering service was paged.  Waiting for doctor to return the call.  Patient is on the bedpan.

## 2018-02-14 LAB — URINE CULTURE: Culture: <10000 — AB

## 2018-02-14 NOTE — Progress Notes (Signed)
NEUROSURGERY PROGRESS NOTE  Doing well. Complains of appropriate back soreness. Denies any leg pain. STill has some generalize weakness. UA negative. Incision CDI  Temp:  [97.6 F (36.4 C)-98.7 F (37.1 C)] 98.1 F (36.7 C) (05/24 0824) Pulse Rate:  [70-80] 72 (05/24 0824) Resp:  [15-16] 16 (05/24 0333) BP: (129-162)/(57-74) 141/71 (05/24 0824) SpO2:  [98 %-100 %] 100 % (05/24 0824)  Plan: Continue therapies  Eleonore Chiquito, NP 02/14/2018 10:14 AM

## 2018-02-14 NOTE — Progress Notes (Signed)
Physical Therapy Treatment Patient Details Name: Amy [name] MRN: [id number] DOB: 1946-08-20 Today's Date: 02/14/2018    History of Present Illness pt is a 72 y/o female with pmh significant for chronic back pain with fusion surgeries, neuropathy and PVD, admitted with worsening back pain and pseudoarthrosis at L1-L2.  pt s/p removal of L1-L3 hardware, pedical screws at T10-12 with tie in to ole L23 construct and PLA T10-L3.    PT Comments    Pt progressing, but slowly.  Pain is limiting mobility to in the room only lately.   Follow Up Recommendations  Supervision/Assistance - 24 hour;SNF;Other (comment)     Equipment Recommendations  Rolling walker with 5" wheels    Recommendations for Other Services       Precautions / Restrictions Precautions Precautions: Back Required Braces or Orthoses: Spinal Brace    Mobility  Bed Mobility Overal bed mobility: Needs Assistance Bed Mobility: Rolling;Sidelying to Sit Rolling: Mod assist Sidelying to sit: Mod assist       General bed mobility comments: painful and needing significant truncal and LE assist  Transfers Overall transfer level: Needs assistance Equipment used: Rolling walker (2 wheeled) Transfers: Sit to/from Stand Sit to Stand: Mod assist         General transfer comment: cues for hand placement and extra stability and boost assist due to pain.  Ambulation/Gait Ambulation/Gait assistance: Min assist Ambulation Distance (Feet): 10 Feet(then after toileting 30 feet) Assistive device: Rolling walker (2 wheeled) Gait Pattern/deviations: Step-through pattern Gait velocity: slower   General Gait Details: slow short tentative steps, flexed posture   Stairs             Wheelchair Mobility    Modified Rankin (Stroke Patients Only)       Balance Overall balance assessment: Needs assistance   Sitting balance-Leahy Scale: Fair Sitting balance - Comments: fair statically, but doesn't move  well toward away from and back toward midline due to painl   Standing balance support: Bilateral upper extremity supported Standing balance-Leahy Scale: Poor Standing balance comment: reliant on the RW for support due to pain.                            Cognition Arousal/Alertness: Awake/alert Behavior During Therapy: WFL for tasks assessed/performed Overall Cognitive Status: Within Functional Limits for tasks assessed                                 General Comments: pt lethargic initially, but started to arouse and engage within 15 minutes of session start.      Exercises      General Comments        Pertinent Vitals/Pain Faces Pain Scale: Hurts whole lot Pain Location: back Pain Descriptors / Indicators: Operative site guarding;Grimacing;Moaning Pain Intervention(s): Monitored during session    Home Living                      Prior Function            PT Goals (current goals can now be found in the care plan section) Acute Rehab PT Goals Patient Stated Goal: get better PT Goal Formulation: With patient Time For Goal Achievement: 02/17/18 Potential to Achieve Goals: Good Progress towards PT goals: Progressing toward goals    Frequency    Min 5X/week      PT Plan Current plan remains  appropriate    Co-evaluation              AM-PAC PT "6 Clicks" Daily Activity  Outcome Measure  Difficulty turning over in bed (including adjusting bedclothes, sheets and blankets)?: Unable Difficulty moving from lying on back to sitting on the side of the bed? : Unable Difficulty sitting down on and standing up from a chair with arms (e.g., wheelchair, bedside commode, etc,.)?: Unable Help needed moving to and from a bed to chair (including a wheelchair)?: A Lot Help needed walking in hospital room?: A Little Help needed climbing 3-5 steps with a railing? : A Lot 6 Click Score: 10    End of Session   Activity Tolerance: Patient  tolerated treatment well Patient left: in chair;with call bell/phone within reach Nurse Communication: Mobility status PT Visit Diagnosis: Other abnormalities of gait and mobility (R26.89);Pain Pain - part of body: (back)     Time: 1345-1414 PT Time Calculation (min) (ACUTE ONLY): 29 min  Charges:  $Gait Training: 8-22 mins $Therapeutic Activity: 8-22 mins                    G Codes:       02-27-2018  Amy [name], PT [phone]  (pager)   Amy [name] 2018-02-27, 2:23 PM

## 2018-02-15 LAB — BASIC METABOLIC PANEL
Anion gap: 7 (ref 5–15)
BUN: 15 mg/dL (ref 6–20)
CO2: 25 mmol/L (ref 22–32)
Calcium: 8.5 mg/dL — ABNORMAL LOW (ref 8.9–10.3)
Chloride: 108 mmol/L (ref 101–111)
Creatinine, Ser: 1.01 mg/dL — ABNORMAL HIGH (ref 0.44–1.00)
GFR calc Af Amer: >60 mL/min (ref >60)
GFR calc non Af Amer: 54 mL/min — ABNORMAL LOW (ref >60)
Glucose, Bld: 113 mg/dL — ABNORMAL HIGH (ref 65–99)
Potassium: 3.7 mmol/L (ref 3.5–5.1)
Sodium: 140 mmol/L (ref 135–145)

## 2018-02-15 LAB — VANCOMYCIN, TROUGH: Vancomycin Tr: 13 ug/mL — ABNORMAL LOW (ref 15–20)

## 2018-02-15 MED ORDER — OXYCODONE HCL 5 MG PO TABS
10.0000 mg | ORAL_TABLET | ORAL | Status: DC | PRN
  Administered 2018-02-15 – 2018-02-18 (×9): 10 mg via ORAL
  Filled 2018-02-15 (×9): qty 2

## 2018-02-15 MED ORDER — OXYCODONE HCL 5 MG PO TABS
5.0000 mg | ORAL_TABLET | ORAL | Status: DC | PRN
  Administered 2018-02-16 – 2018-02-18 (×4): 5 mg via ORAL
  Filled 2018-02-15 (×5): qty 1

## 2018-02-15 MED ORDER — VANCOMYCIN HCL IN DEXTROSE 1-5 GM/200ML-% IV SOLN
1000.0000 mg | Freq: Two times a day (BID) | INTRAVENOUS | Status: DC
  Administered 2018-02-15 – 2018-02-16 (×2): 1000 mg via INTRAVENOUS
  Filled 2018-02-15 (×2): qty 200

## 2018-02-15 NOTE — Progress Notes (Signed)
Patient states has increasing pain with palpation / activity in right posterior neck area.  States pain is "as if I pinched a nerve in my neck".  Pain worsens with palpation / activity. Patient medicated with Tylenol / Flexeril with minimal effect.  MD notified on rounds.

## 2018-02-15 NOTE — Progress Notes (Signed)
1900: Handoff report received from RN. Pt resting in bed. Discussed plan of care for the shift; pt amenable to plan.  2200: Pt expressing great relief to be able to have rx option for pain management added back.  0000: Pt resting comfortably.  0400: Pt continues resting comfortably.  0700: Handoff report given to RN. No acute events overnight.

## 2018-02-15 NOTE — Progress Notes (Signed)
Overall is overall stable.  Overall stable.  Still with quite a bit of incisional pain.  Patient also complains of some neck pain today.  Afebrile.  Vitals are stable.  Drain output still moderate.  Motor and sensory examination stable.  Wound dressing clean and dry.  Abdomen soft.  Progressing very slowly.  Continue efforts at mobilization and pain control.

## 2018-02-15 NOTE — Progress Notes (Signed)
ANTIBIOTIC CONSULT NOTE - Follow up  Pharmacy Consult for  vancomycin Indication: surgical prophylaxis  Allergies  Allergen Reactions  . Latex Other (See Comments)    Sores, blisters - reaction to gloves  . Penicillins Other (See Comments)    UNSPECIFIED REACTION  "Has taken amoxicillin and ampicillin with no reaction" Has patient had a PCN reaction causing immediate rash, facial/tongue/throat swelling, SOB or lightheadedness with hypotension: Unknown Has patient had a PCN reaction causing severe rash involving mucus membranes or skin necrosis: Unknown Has patient had a PCN reaction that required hospitalization: Unknown Has patient had a PCN reaction occurring within the last 10 years: No   . Tomato Rash    Patient Measurements: Height: 5' 5.5" (166.4 cm) Weight: 270 lb (122.5 kg) IBW/kg (Calculated) : 58.15   Vital Signs: Temp: 99.2 F (37.3 C) (05/25 2041) Temp Source: Oral (05/25 2041) BP: 155/72 (05/25 2041) Pulse Rate: 94 (05/25 2041) Intake/Output from previous day: 05/24 0701 - 05/25 0700 In: 1680 [P.O.:480; I.V.:700; IV Piggyback:500] Out: 30 [Drains:30] Intake/Output from this shift: No intake/output data recorded.  Labs: Recent Labs    02/13/18 0229 02/13/18 0827 02/15/18 0249  WBC  --  14.7*  --   HGB  --  11.3*  --   PLT  --  274  --   CREATININE 1.24*  --  1.01*   Estimated Creatinine Clearance: 66.7 mL/min (A) (by C-G formula based on SCr of 1.01 mg/dL (H)). Recent Labs    02/15/18 [place] 13*     Microbiology: Recent Results (from the past 720 hour(s))  Surgical pcr screen     Status: Abnormal   Collection Time: 02/04/18  9:50 AM  Result Value Ref Range Status   MRSA, PCR NEGATIVE NEGATIVE Final   Staphylococcus aureus POSITIVE (A) NEGATIVE Final    Comment: (NOTE) The Xpert SA Assay (FDA approved for NASAL specimens in patients 4 years of age and older), is one component of a comprehensive surveillance program. It is not  intended to diagnose infection nor to guide or monitor treatment. Performed at [facility], [place] [address] [place], [place] [address]   Culture, Urine     Status: Abnormal   Collection Time: 02/13/18  3:25 PM  Result Value Ref Range Status   Specimen Description URINE, CLEAN CATCH  Final   Special Requests NONE  Final   Culture (A)  Final    <10,000 COLONIES/mL INSIGNIFICANT GROWTH Performed at [facility], [place] [address] [place], [place] [address]    Report Status 02/14/2018 FINAL  Final    Medical History: Past Medical History:  Diagnosis Date  . Abdominal pain of unknown etiology 02/05/2016  . Anxiety   . Arthritis    ddd- RA  . Asthma    "sleeping asthma"  . Chronic back pain    lumbar steroid injection's  . Complication of anesthesia    Hard to wake up. Pt sts is took 3 days.  . Constipation   . Depression   . Dysphonia    intermittent "voice changes"  . Fibromyalgia   . GERD (gastroesophageal reflux disease)   . Glaucoma   . Headache   . History of DVT (deep vein thrombosis) 1989   LEFT UPPER ARM  . History of hiatal hernia   . History of MRSA infection 2011  . Hypertension   . Irregular heart rate    "years ago"  . Low iron   . Lumbago   .  Mild obstructive sleep apnea    per study 02-07-2006 - no cpap  . Neuropathy    feet  . Pelvic pain in female   . Peripheral vascular disease ([place])    "poor circulation"  . SBO (small bowel obstruction) ([facility]) 06/2016  . Seasonal allergies   . Short of breath on exertion   . Urinary frequency   . Weakness    both hands and feet     Assessment: 72 yo female on vancomycin for surgical prophylaxis. Vancomycin trough 13 (early).   Goal of Therapy:  Vancomycin troughs 15-20 mcg/ml  Plan:  Increase vancomycin to 1000mg  q12h VT as indicated  Dwayne A. Levada Dy, PharmD, [facility] Pager: [phone]  02/15/2018,9:01 PM

## 2018-02-16 NOTE — Progress Notes (Signed)
The nurses had some difficulty removing the patient's Hemovac drain.  The drain was a bit "stuck", likely from having been in for several days,  but I was able to get it out intact.

## 2018-02-16 NOTE — Progress Notes (Addendum)
Patient tolerated sitting up in chair approximately 2 hrs today without difficulty.  Attempted to to remove Hemovac tubing with a lot of resistance however, Dr Arnoldo Morale in this pm and removed Hemovac stating lots of resistance probably due to 6 days in place. Patient states she is missing cell phone.  Nurse tech and myself looked for phone in the room and linens after changing bed, Called husband to confirm phone left in the room.  Reported no cell phone found in room  to [facility]. Patient did state she was starting to get relief from the pain in her neck after being out of bed, applying ice to the area, and pain med with Flexeril.

## 2018-02-16 NOTE — Progress Notes (Addendum)
Attempt to remove Hemovac once patient back in the bed from sitting in the chair.  After 4 RNs attempt to remove with much tension after sutures removed, Hemovac unable to come out.  Will notify neuro on call. Dr Arnoldo Morale notified by text.

## 2018-02-16 NOTE — Progress Notes (Signed)
Subjective: The patient is alert and pleasant.  Back is appropriately sore.  She is awaiting rehab placement.  Objective: Vital signs in last 24 hours: Temp:  [98.6 F (37 C)-100 F (37.8 C)] 98.6 F (37 C) (05/26 0748) Pulse Rate:  [79-94] 82 (05/26 0748) Resp:  [15] 15 (05/25 2041) BP: (140-173)/(72-85) 169/85 (05/26 0836) SpO2:  [94 %-100 %] 94 % (05/26 0748) Estimated body mass index is 44.25 kg/m as calculated from the following:   Height as of this encounter: 5' 5.5" (1.664 m).   Weight as of this encounter: 122.5 kg (270 lb).   Intake/Output from previous day: 05/25 0701 - 05/26 0700 In: 3775 [P.O.:660; I.V.:2915; IV Piggyback:200] Out: 1050 [Urine:1025; Drains:25] Intake/Output this shift: No intake/output data recorded.  Physical exam patient is alert and oriented.  She is moving her lower as well.  Lab Results: No results for input(s): WBC, HGB, HCT, PLT in the last 72 hours. BMET Recent Labs    02/15/18 0249  NA 140  K 3.7  CL 108  CO2 25  GLUCOSE 113*  BUN 15  CREATININE 1.01*  CALCIUM 8.5*    Studies/Results: No results found.  Assessment/Plan: Postop day #6: The patient's drain has not put anything out in the last 36 hours.  We will discontinue it.  We are awaiting rehab placement.  LOS: 6 days     Ophelia Charter 02/16/2018, 11:04 AM

## 2018-02-17 NOTE — Progress Notes (Signed)
Pt states her cell phone is missing from her bedside table and believes it got lost in linen that was removed last night. Security was called to file a report.

## 2018-02-17 NOTE — Progress Notes (Signed)
Subjective: The patient is alert and pleasant.  She complains of back pain and right leg pain.  Objective: Vital signs in last 24 hours: Temp:  [98.5 F (36.9 C)-100.2 F (37.9 C)] 98.5 F (36.9 C) (05/27 0443) Pulse Rate:  [75-103] 77 (05/27 0443) Resp:  [21] 21 (05/26 1700) BP: (124-169)/(67-93) 168/85 (05/27 0443) SpO2:  [92 %-96 %] 92 % (05/27 0443) Estimated body mass index is 44.25 kg/m as calculated from the following:   Height as of this encounter: 5' 5.5" (1.664 m).   Weight as of this encounter: 122.5 kg (270 lb).   Intake/Output from previous day: 05/26 0701 - 05/27 0700 In: 440 [P.O.:380] Out: 240 [Urine:240] Intake/Output this shift: No intake/output data recorded.  Physical exam the patient is alert and pleasant.  She is moving all 4 extremities well.  Lab Results: No results for input(s): WBC, HGB, HCT, PLT in the last 72 hours. BMET Recent Labs    02/15/18 0249  NA 140  K 3.7  CL 108  CO2 25  GLUCOSE 113*  BUN 15  CREATININE 1.01*  CALCIUM 8.5*    Studies/Results: No results found.  Assessment/Plan: Status post thoracolumbar fusion: The patient is making slow progress.  It looks like she will need rehab.  LOS: 7 days     Ophelia Charter 02/17/2018, 7:31 AM

## 2018-02-17 NOTE — Progress Notes (Signed)
1900: Handoff report received from RN. Pt resting in bed. Discussed plan of care for the shift; pt amenable to plan.  0000: Pt resting comfortably.  0400: Pt continues resting comfortably.  0700: Handoff report given to RN. No acute events overnight.  

## 2018-02-17 NOTE — Progress Notes (Signed)
Physical Therapy Treatment Patient Details Name: Amy [name] MRN: [id number] DOB: May 19, 1946 Today's Date: 02/17/2018    History of Present Illness pt is a 72 y/o female with pmh significant for chronic back pain with fusion surgeries, neuropathy and PVD, admitted with worsening back pain and pseudoarthrosis at L1-L2.  pt s/p removal of L1-L3 hardware, pedical screws at T10-12 with tie in to ole L23 construct and PLA T10-L3.    PT Comments    Slow to progress.  Still focused on pain, moving slowly with effort.  Main focus willl be to improve speed, stamina and help pt relax.    Follow Up Recommendations  Supervision/Assistance - 24 hour;SNF;Other (comment)     Equipment Recommendations  Rolling walker with 5" wheels    Recommendations for Other Services       Precautions / Restrictions Precautions Precautions: Back Required Braces or Orthoses: Spinal Brace    Mobility  Bed Mobility Overal bed mobility: Needs Assistance Bed Mobility: Rolling;Sidelying to Sit Rolling: Min assist Sidelying to sit: Min assist     Sit to sidelying: Min assist General bed mobility comments: So slow as to not be able to build any momentum.  Transfers Overall transfer level: Needs assistance Equipment used: Rolling walker (2 wheeled) Transfers: Sit to/from Stand Sit to Stand: Min guard         General transfer comment: slow and effortful, safe hand placement  Ambulation/Gait Ambulation/Gait assistance: Min guard Ambulation Distance (Feet): 120 Feet Assistive device: Rolling walker (2 wheeled) Gait Pattern/deviations: Step-through pattern Gait velocity: slower Gait velocity interpretation: <1.31 ft/sec, indicative of household ambulator General Gait Details: very slow, short steps.  pt keeping body stiff, posture flexed, shoulders elevated.   Stairs             Wheelchair Mobility    Modified Rankin (Stroke Patients Only)       Balance Overall balance  assessment: Needs assistance Sitting-balance support: No upper extremity supported Sitting balance-Leahy Scale: Fair       Standing balance-Leahy Scale: Fair Standing balance comment: static standing at sink, reaching for soad and toweling without UE assist                            Cognition Arousal/Alertness: Awake/alert Behavior During Therapy: WFL for tasks assessed/performed Overall Cognitive Status: Within Functional Limits for tasks assessed                                        Exercises      General Comments General comments (skin integrity, edema, etc.): reinforced progression of activity from present point.      Pertinent Vitals/Pain Pain Assessment: Faces Faces Pain Scale: Hurts even more Pain Location: back, nec Pain Descriptors / Indicators: Operative site guarding;Grimacing;Moaning    Home Living                      Prior Function            PT Goals (current goals can now be found in the care plan section) Acute Rehab PT Goals Patient Stated Goal: get better PT Goal Formulation: With patient Time For Goal Achievement: 02/17/18 Potential to Achieve Goals: Good Progress towards PT goals: Progressing toward goals    Frequency    Min 5X/week      PT Plan Current plan remains appropriate  Co-evaluation              AM-PAC PT "6 Clicks" Daily Activity  Outcome Measure  Difficulty turning over in bed (including adjusting bedclothes, sheets and blankets)?: Unable Difficulty moving from lying on back to sitting on the side of the bed? : Unable Difficulty sitting down on and standing up from a chair with arms (e.g., wheelchair, bedside commode, etc,.)?: A Lot Help needed moving to and from a bed to chair (including a wheelchair)?: A Little Help needed walking in hospital room?: A Little Help needed climbing 3-5 steps with a railing? : A Lot 6 Click Score: 12    End of Session Equipment Utilized  During Treatment: Back brace Activity Tolerance: Patient tolerated treatment well Patient left: in chair;with call bell/phone within reach Nurse Communication: Mobility status PT Visit Diagnosis: Other abnormalities of gait and mobility (R26.89);Pain Pain - part of body: (neck back)     Time: 1008-1040 PT Time Calculation (min) (ACUTE ONLY): 32 min  Charges:  $Gait Training: 8-22 mins $Therapeutic Activity: 8-22 mins                    G Codes:       Feb 20, 2018  Amy [name], PT [phone]  (pager)   Amy [name] 2018/02/20, 10:54 AM

## 2018-02-17 NOTE — Social Work (Signed)
CSW left packet with SNF options at bedside, continue to follow to support discharge.  Alexander Mt, [place] Work [phone]

## 2018-02-18 DIAGNOSIS — M960 Pseudarthrosis after fusion or arthrodesis: Secondary | ICD-10-CM | POA: Diagnosis not present

## 2018-02-18 DIAGNOSIS — Z743 Need for continuous supervision: Secondary | ICD-10-CM | POA: Diagnosis not present

## 2018-02-18 DIAGNOSIS — G8918 Other acute postprocedural pain: Secondary | ICD-10-CM | POA: Diagnosis not present

## 2018-02-18 DIAGNOSIS — R279 Unspecified lack of coordination: Secondary | ICD-10-CM | POA: Diagnosis not present

## 2018-02-18 DIAGNOSIS — R2681 Unsteadiness on feet: Secondary | ICD-10-CM | POA: Diagnosis not present

## 2018-02-18 DIAGNOSIS — R2689 Other abnormalities of gait and mobility: Secondary | ICD-10-CM | POA: Diagnosis not present

## 2018-02-18 DIAGNOSIS — Z981 Arthrodesis status: Secondary | ICD-10-CM | POA: Diagnosis not present

## 2018-02-18 DIAGNOSIS — K219 Gastro-esophageal reflux disease without esophagitis: Secondary | ICD-10-CM | POA: Diagnosis not present

## 2018-02-18 DIAGNOSIS — T7840XA Allergy, unspecified, initial encounter: Secondary | ICD-10-CM | POA: Diagnosis not present

## 2018-02-18 DIAGNOSIS — I10 Essential (primary) hypertension: Secondary | ICD-10-CM | POA: Diagnosis not present

## 2018-02-18 DIAGNOSIS — M6281 Muscle weakness (generalized): Secondary | ICD-10-CM | POA: Diagnosis not present

## 2018-02-18 DIAGNOSIS — M62830 Muscle spasm of back: Secondary | ICD-10-CM | POA: Diagnosis not present

## 2018-02-18 DIAGNOSIS — R110 Nausea: Secondary | ICD-10-CM | POA: Diagnosis not present

## 2018-02-18 DIAGNOSIS — H04129 Dry eye syndrome of unspecified lacrimal gland: Secondary | ICD-10-CM | POA: Diagnosis not present

## 2018-02-18 DIAGNOSIS — T7840XD Allergy, unspecified, subsequent encounter: Secondary | ICD-10-CM | POA: Diagnosis not present

## 2018-02-18 DIAGNOSIS — Z6835 Body mass index (BMI) 35.0-35.9, adult: Secondary | ICD-10-CM | POA: Diagnosis not present

## 2018-02-18 DIAGNOSIS — R51 Headache: Secondary | ICD-10-CM | POA: Diagnosis not present

## 2018-02-18 DIAGNOSIS — Z4789 Encounter for other orthopedic aftercare: Secondary | ICD-10-CM | POA: Diagnosis not present

## 2018-02-18 DIAGNOSIS — G894 Chronic pain syndrome: Secondary | ICD-10-CM | POA: Diagnosis not present

## 2018-02-18 MED ORDER — OXYCODONE-ACETAMINOPHEN 5-325 MG PO TABS: 1.0000 | ORAL_TABLET | ORAL | 0 refills | Status: DC | PRN

## 2018-02-18 MED ORDER — CYCLOBENZAPRINE HCL 10 MG PO TABS: 10.0000 mg | ORAL_TABLET | Freq: Three times a day (TID) | ORAL | 0 refills | Status: DC | PRN

## 2018-02-18 NOTE — Social Work (Signed)
Clinical Social Worker facilitated patient discharge including contacting patient family and facility to confirm patient discharge plans.  Clinical information faxed to facility and family agreeable with plan.  CSW arranged ambulance transport via [facility] to [facility]. RN to call [phone] with report  prior to discharge.  Clinical Social Worker will sign off for now as social work intervention is no longer needed. Please consult us again if new need arises.  Isabel H Chasse, LCSWA Clinical Social Worker  

## 2018-02-18 NOTE — Progress Notes (Signed)
Attempted two more times to give report with no answer Phone number left with secretary Patient voided one last time is dressed, last meds given and in the chair waiting on transportation.

## 2018-02-18 NOTE — Care Management Note (Signed)
Case Management Note  Patient Details  Name: [name] MRN: [id number] Date of Birth: 1946-06-10  Subjective/Objective:   Pt s/p removal of L1-L3 hardware, pedical screws at T10-12 with tie in to old L2-3 construct and PLA T10-L3.  PTA, pt independent, lives with spouse.  Pt states that spouse will not provide any support.                Action/Plan: Pt will need SNF at dc.  CSW following to facilitate dc to SNF at dc.    Expected Discharge Date:  02/18/18               Expected Discharge Plan:  Skilled Nursing Facility  In-House Referral:  Clinical Social Work  Discharge planning Services  CM Consult  Post Acute Care Choice:    Choice offered to:     DME Arranged:    DME Agency:     HH Arranged:    [place] Agency:     Status of Service:  Completed, signed off  If discussed at [employer], dates discussed:    Additional Comments:  02/18/18 J. Amerson, RN, BSN Pt medically stable for discharge today.  Plan dc to SNF today, per CSW arrangements.    Reinaldo Raddle, RN, BSN  Trauma/Neuro ICU Case Manager [phone]

## 2018-02-18 NOTE — Progress Notes (Signed)
NEUROSURGERY PROGRESS NOTE  Doing well. Complains of appropriate back soreness. Ambulating and voiding well Incision CDI  Temp:  [98.8 F (37.1 C)-99.6 F (37.6 C)] 99.6 F (37.6 C) (05/28 0800) Pulse Rate:  [81-88] 87 (05/28 0800) Resp:  [16-20] 16 (05/28 0800) BP: (165-178)/(86-97) 178/97 (05/28 0800) SpO2:  [92 %-97 %] 93 % (05/28 0800)  Plan: Doing well, awaiting placement [facility] and hopefully dc today. Please call when bed available. [place], NP 02/18/2018 11:46 AM

## 2018-02-18 NOTE — Discharge Summary (Signed)
Physician Discharge Summary  Patient ID: Amy [name] MRN: [id number] DOB/AGE: [id number] y.o.  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 The patient tolerated the procedure well and was taken to the recovery room and then to the floor in stable condition. The hospital course was routine. There were no complications. The wound remained clean dry and intact. Pt had appropriate back soreness. No complaints of leg pain or new N/T/W. The patient remained afebrile with stable vital signs, and tolerated a regular diet. The patient continued to increase activities, and pain was well controlled with oral pain medications.   Consults: None  Significant Diagnostic Studies:  Results for orders placed or performed during the hospital encounter of 02/10/18  Culture, Urine  Result Value Ref Range   Specimen Description URINE, CLEAN CATCH    Special Requests NONE    Culture (A)     <10,000 COLONIES/mL INSIGNIFICANT GROWTH Performed at [facility] [address], [place], [place] [address]    Report Status 02/14/2018 FINAL   Basic metabolic panel  Result Value Ref Range   Sodium 136 135 - 145 mmol/L   Potassium 4.1 3.5 - 5.1 mmol/L   Chloride 104 101 - 111 mmol/L   CO2 24 22 - 32 mmol/L   Glucose, Bld 133 (H) 65 - 99 mg/dL   BUN 22 (H) 6 - 20 mg/dL   Creatinine, Ser 1.33 (H) 0.44 - 1.00 mg/dL   Calcium 8.4 (L) 8.9 - 10.3 mg/dL   GFR calc non Af Amer 39 (L) >60 mL/min   GFR calc Af Amer 45 (L) >60 mL/min   Anion gap 8 5 - 15  Basic metabolic panel  Result Value Ref Range   Sodium 137 135 - 145 mmol/L   Potassium 4.7 3.5 - 5.1 mmol/L   Chloride 105 101 - 111 mmol/L   CO2 22 22 - 32 mmol/L    Glucose, Bld 100 (H) 65 - 99 mg/dL   BUN 21 (H) 6 - 20 mg/dL   Creatinine, Ser 1.24 (H) 0.44 - 1.00 mg/dL   Calcium 8.5 (L) 8.9 - 10.3 mg/dL   GFR calc non Af Amer 42 (L) >60 mL/min   GFR calc Af Amer 49 (L) >60 mL/min   Anion gap 10 5 - 15  CBC  Result Value Ref Range   WBC 14.7 (H) 4.0 - 10.5 K/uL   RBC 3.81 (L) 3.87 - 5.11 MIL/uL   Hemoglobin 11.3 (L) 12.0 - 15.0 g/dL   HCT 35.4 (L) 36.0 - 46.0 %   MCV 92.9 78.0 - 100.0 fL   MCH 29.7 26.0 - 34.0 pg   MCHC 31.9 30.0 - 36.0 g/dL   RDW 15.1 11.5 - 15.5 %   Platelets 274 150 - 400 K/uL  Urinalysis, Routine w reflex microscopic  Result Value Ref Range   Color, Urine YELLOW YELLOW   APPearance CLOUDY (A) CLEAR   Specific Gravity, Urine 1.014 1.005 - 1.030   pH 6.0 5.0 - 8.0   Glucose, UA NEGATIVE NEGATIVE mg/dL   Hgb urine dipstick NEGATIVE NEGATIVE   Bilirubin Urine NEGATIVE NEGATIVE   Ketones, ur NEGATIVE NEGATIVE mg/dL   Protein, ur NEGATIVE NEGATIVE mg/dL   Nitrite NEGATIVE NEGATIVE  Leukocytes, UA NEGATIVE NEGATIVE  Basic metabolic panel  Result Value Ref Range   Sodium 140 135 - 145 mmol/L   Potassium 3.7 3.5 - 5.1 mmol/L   Chloride 108 101 - 111 mmol/L   CO2 25 22 - 32 mmol/L   Glucose, Bld 113 (H) 65 - 99 mg/dL   BUN 15 6 - 20 mg/dL   Creatinine, Ser 1.01 (H) 0.44 - 1.00 mg/dL   Calcium 8.5 (L) 8.9 - 10.3 mg/dL   GFR calc non Af Amer 54 (L) >60 mL/min   GFR calc Af Amer >60 >60 mL/min   Anion gap 7 5 - 15  Vancomycin, trough  Result Value Ref Range   Vancomycin Tr 13 (L) 15 - 20 ug/mL    Dg Thoracolumabar Spine  Result Date: 02/10/2018 CLINICAL DATA:  72 year old female with a history of revision spinal fixation. EXAM: LUMBAR SPINE - 2-3 VIEW; DG C-ARM 61-120 MIN COMPARISON:  CT 09/25/2017 FINDINGS: Limited intraoperative frontal and lateral view of thoracolumbar spine, with incompletely imaged hardware. Presuming the L1-L2 level is the level with a disc spacer and associated subsidence, as seen on prior  CT, there are now postoperative changes of bilateral pedicle screws at T10, T11, T12. Surgical sponges project over the posterior elements of L1. Incompletely imaged previous lumbar fixation. IMPRESSION: Limited intraoperative fluoroscopic spot images of thoracolumbar fixation/revision, with interval bilateral pedicle screws at T10, T11, T12, as above. Please refer to the dictated operative report for full details of intraoperative findings and procedure. Electronically Signed   By: Corrie Mckusick D.O.   On: 02/10/2018 12:24   Dg C-arm 1-60 Min  Result Date: 02/10/2018 CLINICAL DATA:  72 year old female with a history of revision spinal fixation. EXAM: LUMBAR SPINE - 2-3 VIEW; DG C-ARM 61-120 MIN COMPARISON:  CT 09/25/2017 FINDINGS: Limited intraoperative frontal and lateral view of thoracolumbar spine, with incompletely imaged hardware. Presuming the L1-L2 level is the level with a disc spacer and associated subsidence, as seen on prior CT, there are now postoperative changes of bilateral pedicle screws at T10, T11, T12. Surgical sponges project over the posterior elements of L1. Incompletely imaged previous lumbar fixation. IMPRESSION: Limited intraoperative fluoroscopic spot images of thoracolumbar fixation/revision, with interval bilateral pedicle screws at T10, T11, T12, as above. Please refer to the dictated operative report for full details of intraoperative findings and procedure. Electronically Signed   By: Corrie Mckusick D.O.   On: 02/10/2018 12:24    Antibiotics:  Anti-infectives (From admission, onward)   Start     Dose/Rate Route Frequency Ordered Stop   02/15/18 2100  vancomycin (VANCOCIN) IVPB 1000 mg/200 mL premix  Status:  Discontinued     1,000 mg 200 mL/hr over 60 Minutes Intravenous Every 12 hours 02/15/18 2057 02/16/18 1307   02/12/18 2000  vancomycin (VANCOCIN) 1,500 mg in sodium chloride 0.9 % 500 mL IVPB  Status:  Discontinued     1,500 mg 250 mL/hr over 120 Minutes Intravenous  Every 24 hours 02/12/18 1207 02/15/18 2057   02/10/18 2000  vancomycin (VANCOCIN) IVPB 1000 mg/200 mL premix  Status:  Discontinued     1,000 mg 200 mL/hr over 60 Minutes Intravenous Every 12 hours 02/10/18 1648 02/12/18 1207   02/10/18 1012  vancomycin (VANCOCIN) powder  Status:  Discontinued       As needed 02/10/18 1012 02/10/18 1257   02/10/18 0930  vancomycin (VANCOCIN) 1,500 mg in sodium chloride 0.9 % 500 mL IVPB     1,500 mg 250 mL/hr  over 120 Minutes Intravenous To [facility] 02/07/18 1403 02/10/18 1019   02/10/18 0830  bacitracin 50,000 Units in sodium chloride 0.9 % 500 mL irrigation  Status:  Discontinued       As needed 02/10/18 1007 02/10/18 1257      Discharge Exam: Blood pressure (!) 178/97, pulse 87, temperature 99.6 F (37.6 C), temperature source Oral, resp. rate 16, height 5' 5.5" (1.664 m), weight 122.5 kg (270 lb), SpO2 93 %. Neurologic: Grossly normal Ambulating with assistance  Discharge Medications:   Allergies as of 02/18/2018      Reactions   Latex Other (See Comments)   Sores, blisters - reaction to gloves   Penicillins Other (See Comments)   UNSPECIFIED REACTION  "Has taken amoxicillin and ampicillin with no reaction" Has patient had a PCN reaction causing immediate rash, facial/tongue/throat swelling, SOB or lightheadedness with hypotension: Unknown Has patient had a PCN reaction causing severe rash involving mucus membranes or skin necrosis: Unknown Has patient had a PCN reaction that required hospitalization: Unknown Has patient had a PCN reaction occurring within the last 10 years: No   Tomato Rash      Medication List    TAKE these medications   acetaminophen 500 MG tablet Commonly known as:  TYLENOL Take 1,000 mg by mouth 2 (two) times daily as needed for moderate pain.   azelaic acid 20 % cream Commonly known as:  AZELEX Apply 1 application topically 2 (two) times daily. After skin is thoroughly washed and patted dry, gently but  thoroughly massage a thin film of azelaic acid cream into the affected area twice daily, in the morning and evening.   calcium-vitamin D 500-200 MG-UNIT tablet Commonly known as:  OSCAL WITH D Take 1 tablet by mouth daily with breakfast.   CENTRUM SILVER PO Take 1 tablet by mouth daily.   cloNIDine 0.1 MG tablet Commonly known as:  CATAPRES Take 1 tablet (0.1 mg total) by mouth 3 (three) times daily.   cyclobenzaprine 10 MG tablet Commonly known as:  FLEXERIL Take 1 tablet (10 mg total) by mouth 3 (three) times daily as needed for muscle spasms.   diphenhydrAMINE 25 MG tablet Commonly known as:  BENADRYL Take 25 mg by mouth 2 (two) times daily as needed for allergies.   estradiol 0.1 MG/GM vaginal cream Commonly known as:  ESTRACE Place 1 Applicatorful vaginally 2 (two) times a week. What changed:    when to take this  reasons to take this   fluticasone 50 MCG/ACT nasal spray Commonly known as:  FLONASE Place 1 spray into both nostrils daily. What changed:    when to take this  reasons to take this   gabapentin 300 MG capsule Commonly known as:  NEURONTIN Take 300 mg by mouth 2 (two) times daily.   hydrALAZINE 50 MG tablet Commonly known as:  APRESOLINE Take 50 mg by mouth 3 (three) times daily with meals.   metoprolol tartrate 50 MG tablet Commonly known as:  LOPRESSOR Take 1 tablet (50 mg total) by mouth 2 (two) times daily.   MUSCLE RUB EX Apply 1 application topically 2 (two) times daily as needed (pain).   NIFEdipine 10 MG capsule Commonly known as:  PROCARDIA Take 10 mg by mouth 3 (three) times daily.   ondansetron 4 MG tablet Commonly known as:  ZOFRAN Take 1 tablet (4 mg total) by mouth every 6 (six) hours as needed for nausea.   OVER THE COUNTER MEDICATION Take 2 tablets by mouth daily.  Swiss Kriss OTC herbal laxative   oxyCODONE-acetaminophen 5-325 MG tablet Commonly known as:  PERCOCET Take 1 tablet by mouth every 4 (four) hours as needed  for severe pain.   pantoprazole 40 MG tablet Commonly known as:  PROTONIX Take 2 tablets by mouth daily.   PARoxetine 30 MG tablet Commonly known as:  PAXIL Take 30 mg by mouth daily.   polyethylene glycol packet Commonly known as:  MIRALAX / GLYCOLAX Take 17 g by mouth daily. What changed:    when to take this  reasons to take this   PROBIOTIC PO Take 1 capsule by mouth daily.   SYSTANE COMPLETE 0.6 % Soln Generic drug:  Propylene Glycol Place 1 drop into both eyes 2 (two) times daily as needed (dry eyes).   tapentadol 50 MG tablet Commonly known as:  NUCYNTA Take 50 mg by mouth every 6 (six) hours as needed for moderate pain.   VITAMIN B COMPLEX PO Take 1 tablet by mouth daily.   vitamin B-12 1000 MCG tablet Commonly known as:  CYANOCOBALAMIN Take 1,000 mcg by mouth daily.   vitamin E 400 UNIT capsule Take 400 Units by mouth daily.       Disposition: home   Final Dx: T10-L3 posterior lateral fusion    Discharge Instructions    Call MD for:  difficulty breathing, headache or visual disturbances   Complete by:  As directed    Call MD for:  hives   Complete by:  As directed    Call MD for:  persistant dizziness or light-headedness   Complete by:  As directed    Call MD for:  persistant nausea and vomiting   Complete by:  As directed    Call MD for:  redness, tenderness, or signs of infection (pain, swelling, redness, odor or green/yellow discharge around incision site)   Complete by:  As directed    Call MD for:  severe uncontrolled pain   Complete by:  As directed    Call MD for:  temperature >100.4   Complete by:  As directed    Diet - low sodium heart healthy   Complete by:  As directed    Driving Restrictions   Complete by:  As directed    No driving two weeks   Increase activity slowly   Complete by:  As directed    Lifting restrictions   Complete by:  As directed    Nothing heavier than 8 lbs       Contact information for follow-up  providers    Kary Kos, MD. Schedule an appointment as soon as possible for a visit in 2 week(s).   Specialty:  Neurosurgery Contact information: [address] [place] [address] [phone]            Contact information for after-discharge care    Destination    [facility] .   Service:  Skilled Nursing Contact information: [address] [place] [phone]                   Signed: Ocie Cornfield [facility] 02/18/2018, 3:35 PM

## 2018-02-18 NOTE — Progress Notes (Signed)
Occupational Therapy Treatment Patient Details Name: Amy [name] MRN: [id number] DOB: Jun 09, 1946 Today's Date: 02/18/2018    History of present illness pt is a 72 y/o female with pmh significant for chronic back pain with fusion surgeries, neuropathy and PVD, admitted with worsening back pain and pseudoarthrosis at L1-L2.  pt s/p removal of L1-L3 hardware, pedical screws at T10-12 with tie in to ole L23 construct and PLA T10-L3.   OT comments  Pt progressing toward OT goals, presents sitting EOB agreeable to tx session. Pt completing room level functional mobility using RW with overall MinGuard assist, though demonstrates slow, guarded movements throughout. Pt requiring MinA for UB dressing and standing grooming ADLs. Began education/review of AE for completion of LB ADLs. Will continue education/practice in following sessions. Feel SNF recommendation remains appropriate at this time. Will continue to follow acutely to progress pt towards established OT goals.   Follow Up Recommendations  SNF;Supervision/Assistance - 24 hour    Equipment Recommendations  Other (comment)(TBD in next venue )          Precautions / Restrictions Precautions Precautions: Back Precaution Comments: reviewed back precautions Required Braces or Orthoses: Spinal Brace Restrictions Weight Bearing Restrictions: No       Mobility Bed Mobility               General bed mobility comments: seated EOB upon entering room   Transfers Overall transfer level: Needs assistance Equipment used: Rolling walker (2 wheeled) Transfers: Sit to/from Stand Sit to Stand: Min assist;From elevated surface         General transfer comment: very slow and effortful stand with safe hand placement; minA to rise and steady     Balance Overall balance assessment: Needs assistance Sitting-balance support: No upper extremity supported Sitting balance-Leahy Scale: Fair     Standing balance support:  Bilateral upper extremity supported;During functional activity;Single extremity supported Standing balance-Leahy Scale: Fair Standing balance comment: static standing at sink, reaching for soap and toweling with single UE support on sink                            ADL either performed or assessed with clinical judgement   ADL Overall ADL's : Needs assistance/impaired     Grooming: Wash/dry hands;Min guard;Set up;Standing Grooming Details (indicate cue type and reason): minguard for safety with setup assist provided to obtain items due to wide sink          Upper Body Dressing : Set up;Sitting;Minimal assistance Upper Body Dressing Details (indicate cue type and reason): pt donning lumbar corsett with MinA Lower Body Dressing: Moderate assistance;Sit to/from stand Lower Body Dressing Details (indicate cue type and reason): assist to don socks seated EOB; verbally reviewed/educated on use of AE for completing LB ADLs, pt reports she has sock aide at home from previous back surgeries              Functional mobility during ADLs: Minimal assistance;Rolling walker                         Cognition Arousal/Alertness: Awake/alert Behavior During Therapy: WFL for tasks assessed/performed Overall Cognitive Status: Within Functional Limits for tasks assessed  Pertinent Vitals/ Pain       Pain Assessment: Faces Faces Pain Scale: Hurts even more Pain Location: back, neck Pain Descriptors / Indicators: Operative site guarding;Grimacing;Moaning;Aching Pain Intervention(s): Limited activity within patient's tolerance;Monitored during session                                                          Frequency  Min 2X/week        Progress Toward Goals  OT Goals(current goals can now be found in the care plan section)  Progress towards OT goals:  Progressing toward goals  Acute Rehab OT Goals Patient Stated Goal: get better OT Goal Formulation: With patient Time For Goal Achievement: 02/25/18 Potential to Achieve Goals: Good  Plan Discharge plan remains appropriate                     AM-PAC PT "6 Clicks" Daily Activity     Outcome Measure   Help from another person eating meals?: None Help from another person taking care of personal grooming?: A Little Help from another person toileting, which includes using toliet, bedpan, or urinal?: A Lot Help from another person bathing (including washing, rinsing, drying)?: A Lot Help from another person to put on and taking off regular upper body clothing?: A Lot Help from another person to put on and taking off regular lower body clothing?: A Lot 6 Click Score: 15    End of Session Equipment Utilized During Treatment: Rolling walker;Back brace;Gait belt  OT Visit Diagnosis: Unsteadiness on feet (R26.81);Other abnormalities of gait and mobility (R26.89);Muscle weakness (generalized) (M62.81);Pain Pain - part of body: (back )   Activity Tolerance Patient tolerated treatment well   Patient Left Other (comment)(handoff to PT to start tx session )   Nurse Communication Mobility status        Time: 9371-6967 OT Time Calculation (min): 13 min  Charges: OT General Charges $OT Visit: 1 Visit OT Treatments $Self Care/Home Management : 8-22 mins  Lou Cal, OT Pager [phone] 02/18/2018    Raymondo Band 02/18/2018, 11:37 AM

## 2018-02-18 NOTE — Clinical Social Work Placement (Signed)
   CLINICAL SOCIAL WORK PLACEMENT  NOTE Isaias Cowman RN to call report to [phone]  Date:  02/18/2018  Patient Details  Name: Amy [name] MRN: [id number] Date of Birth: [birth year]  Clinical Social Work is seeking post-discharge placement for this patient at the [place] level of care (*CSW will initial, date and re-position this form in  chart as items are completed):  Yes   Patient/family provided with [facility] Work Department's list of facilities offering this level of care within the geographic area requested by the patient (or if unable, by the patient's family).  Yes   Patient/family informed of their freedom to choose among providers that offer the needed level of care, that participate in Medicare, Medicaid or managed care program needed by the patient, have an available bed and are willing to accept the patient.  Yes   Patient/family informed of [facility]'s ownership interest in [facility] and [facility], as well as of the fact that they are under no obligation to receive care at these facilities.  PASRR submitted to EDS on       PASRR number received on       Existing PASRR number confirmed on 02/12/18     FL2 transmitted to all facilities in geographic area requested by pt/family on 02/12/18     FL2 transmitted to all facilities within larger geographic area on       Patient informed that his/her managed care company has contracts with or will negotiate with certain facilities, including the following:        Yes   Patient/family informed of bed offers received.  Patient chooses bed at [facility]     Physician recommends and patient chooses bed at      Patient to be transferred to [facility] on  .  Patient to be transferred to facility by [facility]     Patient family notified on 02/18/18 of transfer.  Name of family member notified:  husband, Lissa Merlin     PHYSICIAN Please prepare priority discharge  summary, including medications, Please prepare prescriptions     Additional Comment:    _______________________________________________ Alexander Mt, LCSWA 02/18/2018, 2:17 PM

## 2018-02-18 NOTE — Progress Notes (Signed)
Attempted top call report. Spoke to [facility], stated "patient is not on the list with no room assignment". Asked me to call back in 15 minutes.

## 2018-02-18 NOTE — Progress Notes (Signed)
[facility] called by clinical social worker to pick patient up at 1730

## 2018-02-18 NOTE — Social Work (Signed)
CSW spoke with pt at bedside, pt states she has chosen [facility]  CSW has informed SNF, await discharge information/timing.   Alexander Mt, [place] Work [phone]

## 2018-02-18 NOTE — Progress Notes (Signed)
Pt reported today that her cell phone went missing sometime last week. Nurse called security at [phone]. Was told to ask for Timothy Lasso, management. Management has already left. Husband told to call the hospital tomorrow to ask for a copy of the report.

## 2018-02-18 NOTE — Progress Notes (Signed)
Physical Therapy Treatment Patient Details Name: Amy [name] MRN: [id number] DOB: [birth year] Today's Date: 02/18/2018    History of Present Illness Amy [name] is a 72 y/o female with pmh significant for chronic back pain with fusion surgeries, neuropathy and PVD, admitted with worsening back pain and pseudoarthrosis at L1-L2.  Amy [name] s/p removal of L1-L3 hardware, pedical screws at T10-12 with tie in to ole L23 construct and PLA T10-L3.    Amy [name] Comments    Improving slowly, mostly due to moderate pain.  Emphasis on education reinforcement, transfer safety and improving gait speed and stamina.   Follow Up Recommendations  Supervision/Assistance - 24 hour;SNF;Other (comment)     Equipment Recommendations  Rolling walker with 5" wheels    Recommendations for Other Services       Precautions / Restrictions Precautions Precautions: Back Precaution Comments: reviewed back precautions Required Braces or Orthoses: Spinal Brace Restrictions Weight Bearing Restrictions: No    Mobility  Bed Mobility               General bed mobility comments: at EOB  Transfers Overall transfer level: Needs assistance Equipment used: Rolling walker (2 wheeled) Transfers: Sit to/from Stand Sit to Stand: Min guard;Min assist         General transfer comment: very slow and effortful stand with safe hand placement; minA to rise and steady   Ambulation/Gait   Ambulation Distance (Feet): 130 Feet Assistive device: Rolling walker (2 wheeled) Gait Pattern/deviations: Step-through pattern   Gait velocity interpretation: <1.31 ft/sec, indicative of household ambulator General Gait Details: generally slow steps, but cuing consistently with verbal and physical cues to increase speed and stride length   Stairs             Wheelchair Mobility    Modified Rankin (Stroke Patients Only)       Balance Overall balance assessment: Needs assistance Sitting-balance support: No upper  extremity supported Sitting balance-Leahy Scale: Fair     Standing balance support: Bilateral upper extremity supported;During functional activity;Single extremity supported Standing balance-Leahy Scale: Fair Standing balance comment: able to stand statically at sink for activity withou UE assist                            Cognition Arousal/Alertness: Awake/alert Behavior During Therapy: WFL for tasks assessed/performed Overall Cognitive Status: Within Functional Limits for tasks assessed                                        Exercises      General Comments General comments (skin integrity, edema, etc.): reinforced progression of activity.      Pertinent Vitals/Pain Pain Assessment: Faces Faces Pain Scale: Hurts even more Pain Location: back, neck Pain Descriptors / Indicators: Operative site guarding;Grimacing;Aching Pain Intervention(s): Monitored during session    Home Living                      Prior Function            Amy [name] Goals (current goals can now be found in the care plan section) Acute Rehab Amy [name] Goals Patient Stated Goal: get better Amy [name] Goal Formulation: With patient Time For Goal Achievement: 02/25/18 Potential to Achieve Goals: Good Progress towards Amy [name] goals: Progressing toward goals    Frequency    Min 5X/week      Amy [name] Plan Current  plan remains appropriate    Co-evaluation              AM-PAC Amy [name] "6 Clicks" Daily Activity  Outcome Measure  Difficulty turning over in bed (including adjusting bedclothes, sheets and blankets)?: Unable Difficulty moving from lying on back to sitting on the side of the bed? : Unable Difficulty sitting down on and standing up from a chair with arms (e.g., wheelchair, bedside commode, etc,.)?: A Lot Help needed moving to and from a bed to chair (including a wheelchair)?: A Little Help needed walking in hospital room?: A Little Help needed climbing 3-5 steps with a railing? :  A Lot 6 Click Score: 12    End of Session Equipment Utilized During Treatment: Back brace Activity Tolerance: Patient tolerated treatment well Patient left: in chair;with call bell/phone within reach Nurse Communication: Mobility status Amy [name] Visit Diagnosis: Other abnormalities of gait and mobility (R26.89);Pain Pain - part of body: (back)     Time: 1126-1140 Amy [name] Time Calculation (min) (ACUTE ONLY): 14 min  Charges:  $Gait Training: 8-22 mins                    G Codes:       Mar 19, 2018  Amy [name], Amy [name] [phone]  (pager)   Amy [name] 03/19/2018, 12:03 PM

## 2018-02-19 DIAGNOSIS — R2681 Unsteadiness on feet: Secondary | ICD-10-CM | POA: Diagnosis not present

## 2018-02-19 DIAGNOSIS — G8918 Other acute postprocedural pain: Secondary | ICD-10-CM | POA: Diagnosis not present

## 2018-02-19 DIAGNOSIS — G894 Chronic pain syndrome: Secondary | ICD-10-CM | POA: Diagnosis not present

## 2018-02-20 DIAGNOSIS — I10 Essential (primary) hypertension: Secondary | ICD-10-CM | POA: Diagnosis not present

## 2018-02-20 DIAGNOSIS — Z6835 Body mass index (BMI) 35.0-35.9, adult: Secondary | ICD-10-CM | POA: Diagnosis not present

## 2018-02-20 DIAGNOSIS — Z981 Arthrodesis status: Secondary | ICD-10-CM | POA: Diagnosis not present

## 2018-02-21 DIAGNOSIS — G8918 Other acute postprocedural pain: Secondary | ICD-10-CM | POA: Diagnosis not present

## 2018-02-21 DIAGNOSIS — G894 Chronic pain syndrome: Secondary | ICD-10-CM | POA: Diagnosis not present

## 2018-02-21 DIAGNOSIS — R2681 Unsteadiness on feet: Secondary | ICD-10-CM | POA: Diagnosis not present

## 2018-02-25 DIAGNOSIS — G8918 Other acute postprocedural pain: Secondary | ICD-10-CM | POA: Diagnosis not present

## 2018-02-25 DIAGNOSIS — R2681 Unsteadiness on feet: Secondary | ICD-10-CM | POA: Diagnosis not present

## 2018-02-25 DIAGNOSIS — G894 Chronic pain syndrome: Secondary | ICD-10-CM | POA: Diagnosis not present

## 2018-02-26 IMAGING — CT CT T SPINE W/O CM
1 series · 12 of 14 positions shown, 15 images · non-contrast
Comparison: Chest CTA 07/21/2016. Lumbar spine CT today reported
separately and 10/30/2016.

CLINICAL DATA: 71-year-old female with chronic back pain. Neck
pain, leg pain. Prior surgery. Unspecified vertebral fracture.

EXAM:
CT THORACIC SPINE WITHOUT CONTRAST
TECHNIQUE: Multidetector CT images of the thoracic were obtained using the
standard protocol without intravenous contrast.

[Series 3: t spine soft · axial · 0.38mm/px · z∈[-398,-113]mm · 12 of 113 slices shown, 15 images]
[im 9/113  soft-tissue]
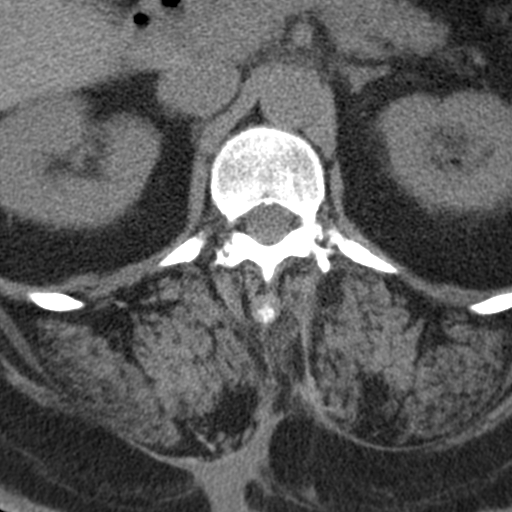
[im 9/113  bone]
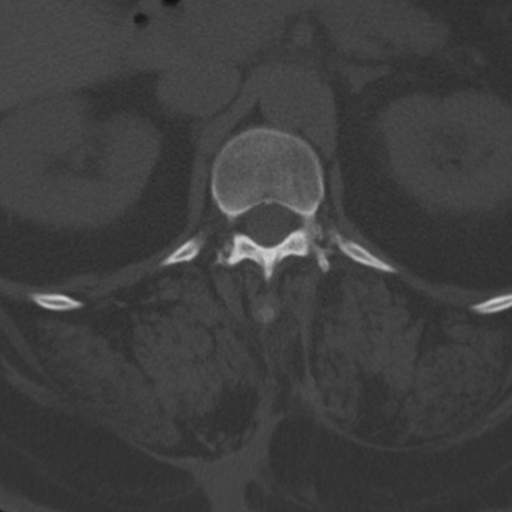
[im 18/113  bone]
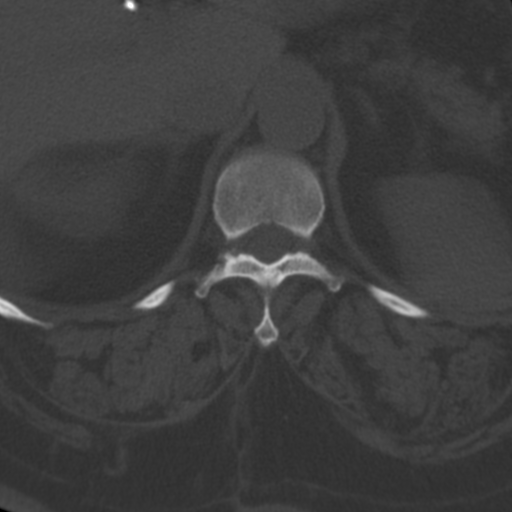
[im 26/113  bone]
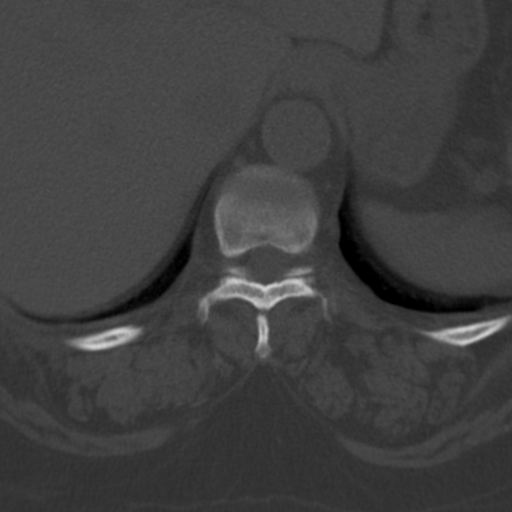
[im 35/113  bone]
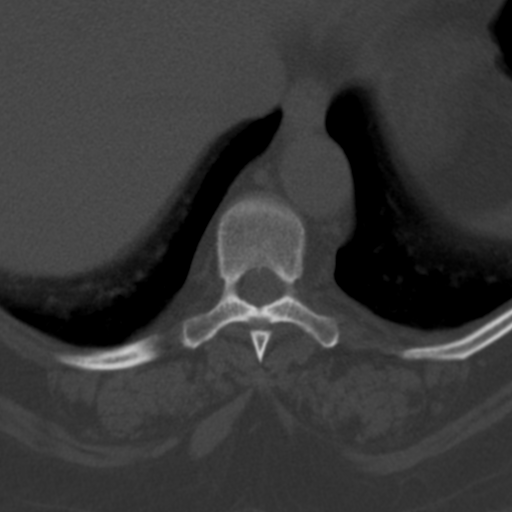
[im 44/113  soft-tissue]
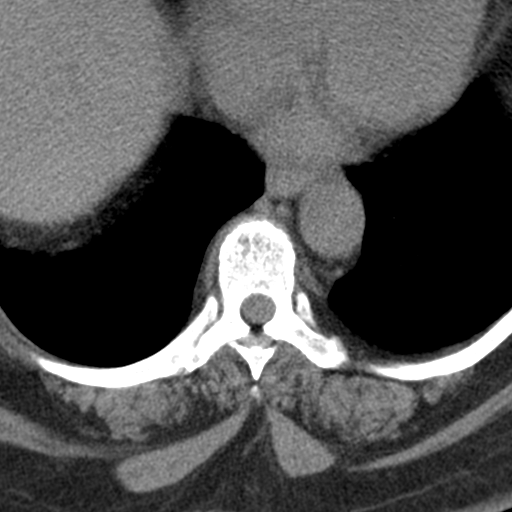
[im 44/113  bone]
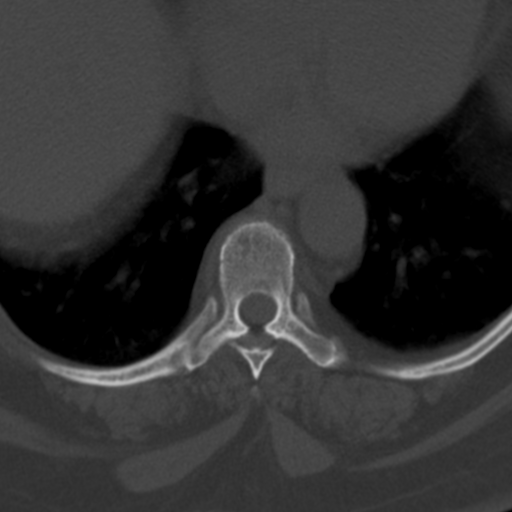
[im 52/113  bone]
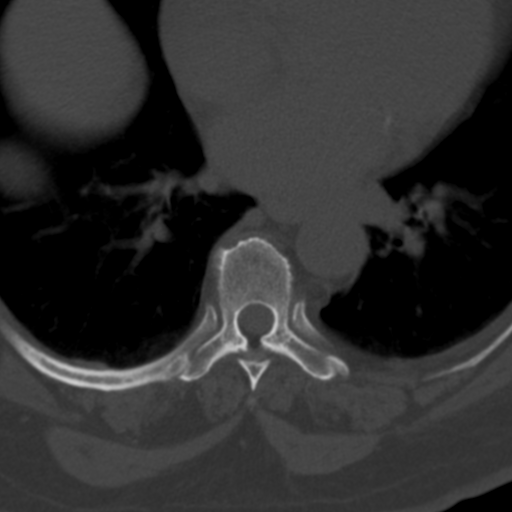
[im 61/113  bone]
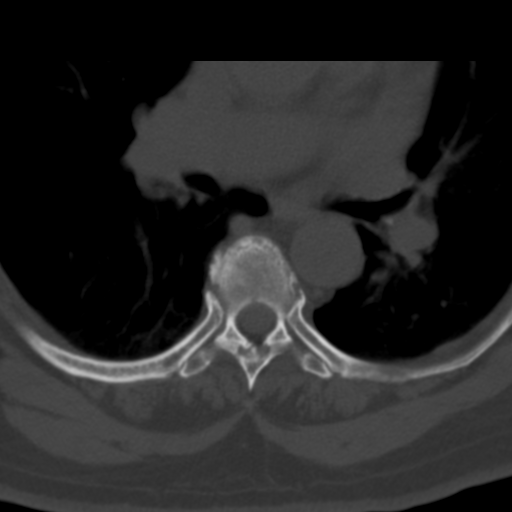
[im 69/113  bone]
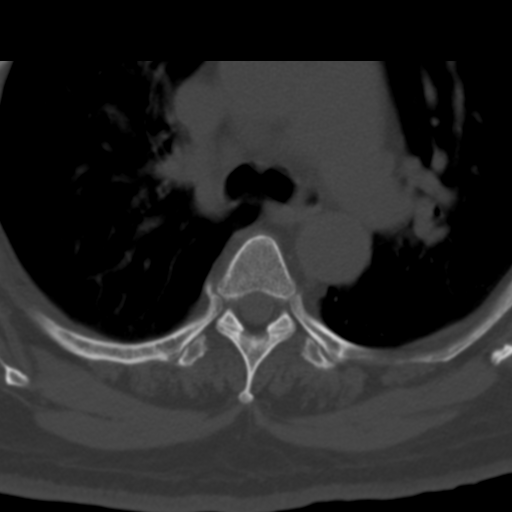
[im 78/113  soft-tissue]
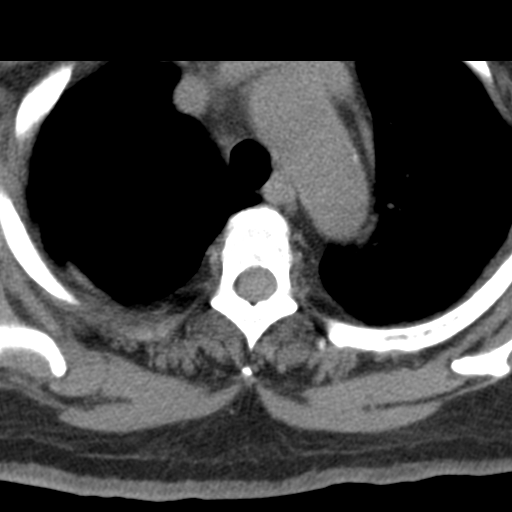
[im 78/113  bone]
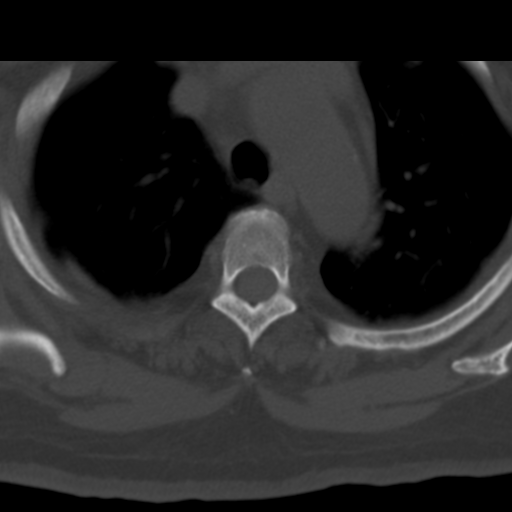
[im 87/113  bone]
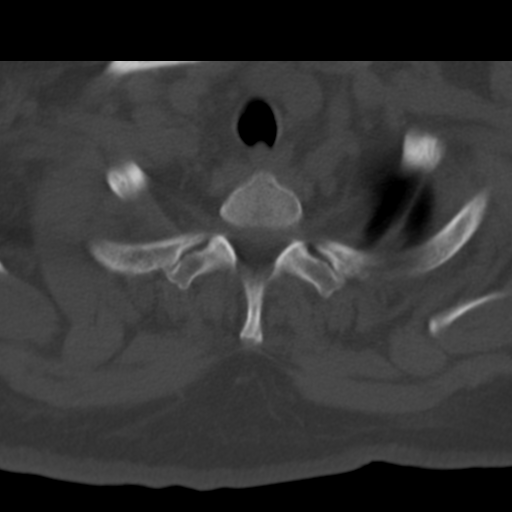
[im 95/113  bone]
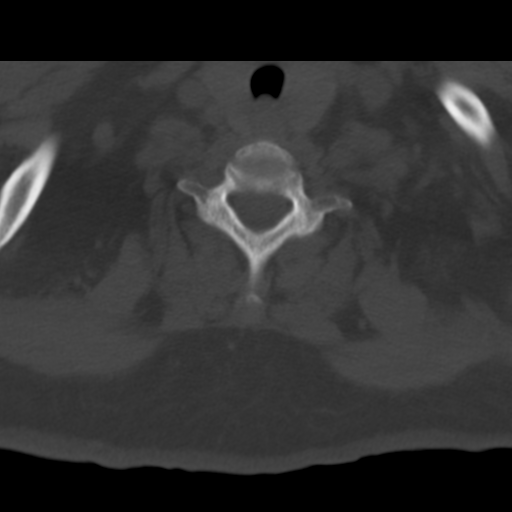
[im 104/113  bone]
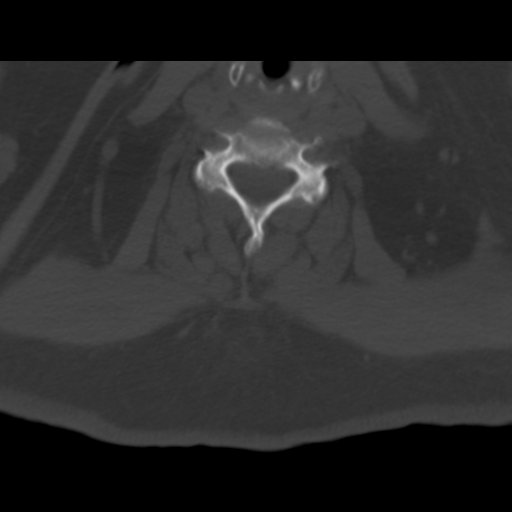

[12 of 14 positions shown; findings below may reference images not displayed]

FINDINGS: Alignment: Thoracic kyphosis appears stable since 8176. No
spondylolisthesis. Slight dextroconvex thoracic spine curvature.
Cervicothoracic junction alignment is within normal limits.

Vertebrae: Negative visible cervical vertebra. Intact thoracic
vertebrae. Multilevel thoracic costovertebral degenerative spurring.
Multilevel endplate spurring, sparing the lower thoracic levels. No
acute osseous abnormality identified.

Left L1 pedicle screw lucency, loosening (series 5, image 111). See
comparison lumbar study today.

Paraspinal and other soft tissues: Negative visible lung parenchyma.
No pericardial or pleural effusion. Calcified coronary artery
atherosclerosis. No mediastinal lymphadenopathy is evident.
Surgically absent gallbladder. Abdominal Calcified aortic
atherosclerosis. Negative visible other noncontrast upper abdominal
viscera. Partially visible postoperative changes to the lower
thoracic and lumbar paraspinal soft tissues.

Disc levels:

T1-T2: Negative.

T2-T3: Mild endplate spurring, but wise negative.

T3-T4: Mild endplate spurring.  Otherwise negative.

T4-T5: Endplate spurring, probably with mild circumferential disc
bulging. Otherwise negative.

T5-T6: Subtle vacuum disc. Probable mild circumferential disc bulge.
Endplate spurring. Mild right facet hypertrophy. No stenosis.

T6-T7: Mild circumferential disc bulge, spurring and mild facet
hypertrophy. No stenosis.

T7-T8: Vacuum disc. Circumferential disc bulge. Mild facet
hypertrophy on the left. No significant stenosis.

T8-T9: Mild vacuum disc. Mild circumferential disc bulge and
endplate spurring. No spinal stenosis. Mild right greater than left
T8 neural foraminal stenosis.

T9-T10: Mild disc bulge. Mild to moderate facet hypertrophy. No
significant spinal stenosis. Mild bilateral T9 foraminal stenosis.

T10-T11: Circumferential disc bulge. Mild to moderate facet
hypertrophy. Possible mild spinal stenosis (series 3, image 90).
Mild right greater than left T10 foraminal stenosis.

T11-T12: Mild circumferential disc bulge.  No stenosis.

T12-L1: Severe facet hypertrophy. Mild endplate spurring greater on
the left. Mild to moderate left T12 foraminal stenosis.
IMPRESSION: 1.  No acute osseous abnormality in the thoracic spine.
2. Multilevel thoracic disc and endplate degeneration. Combined with
posterior element hypertrophy, suspect mild spinal stenosis at
T10-T11.
3. Severe facet arthropathy at T12-L1. Up to moderate left T12
neural foraminal stenosis. Mild thoracic foraminal stenosis
elsewhere.
4. See also Lumbar Spine CT today.

## 2018-02-26 IMAGING — CT CT L SPINE W/O CM
1 of 8 series · 5 of 14 positions shown, 7 images · non-contrast
Comparison: Lumbar spine CT 10/30/2016 and earlier. Thoracic spine
CT today reported separately.

CLINICAL DATA: 71-year-old female with chronic lumbar back pain.
Bilateral leg pain. Prior surgery. Unspecified vertebral fracture.

EXAM:
CT LUMBAR SPINE WITHOUT CONTRAST
TECHNIQUE: Multidetector CT imaging of the lumbar spine was performed without
intravenous contrast administration. Multiplanar CT image
reconstructions were also generated.

[Series 3: l spine soft · axial · 0.36mm/px · z∈[-552,-405]mm · 5 of 75 slices shown, 7 images]
[im 13/75  soft-tissue]
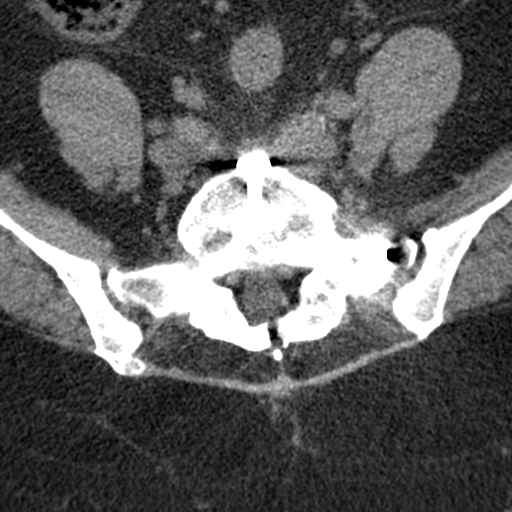
[im 13/75  bone]
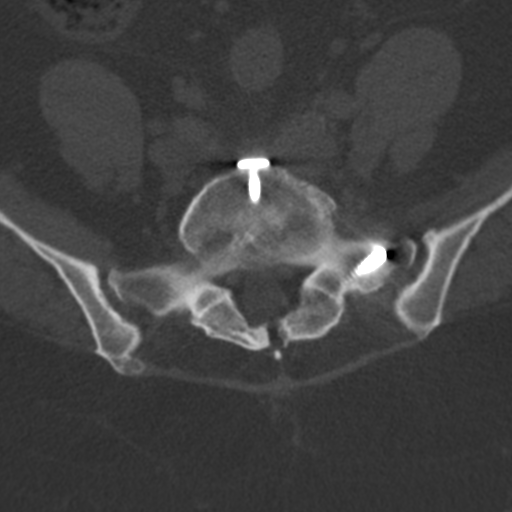
[im 25/75  bone]
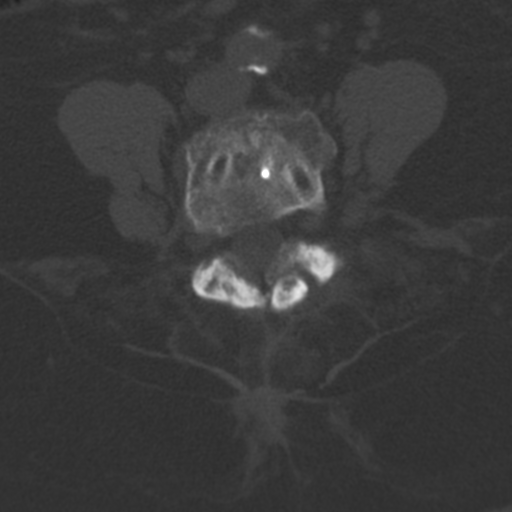
[im 38/75  bone]
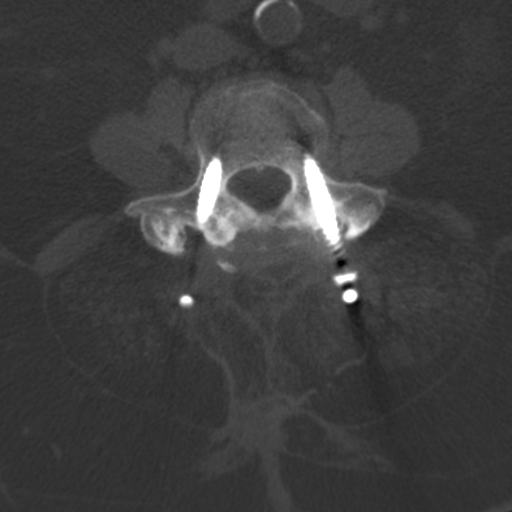
[im 50/75  bone]
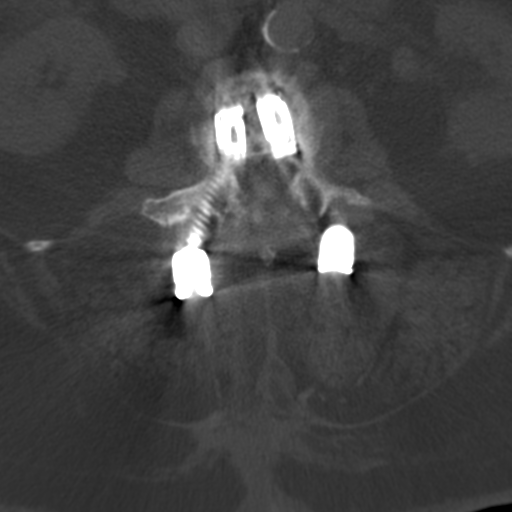
[im 62/75  soft-tissue]
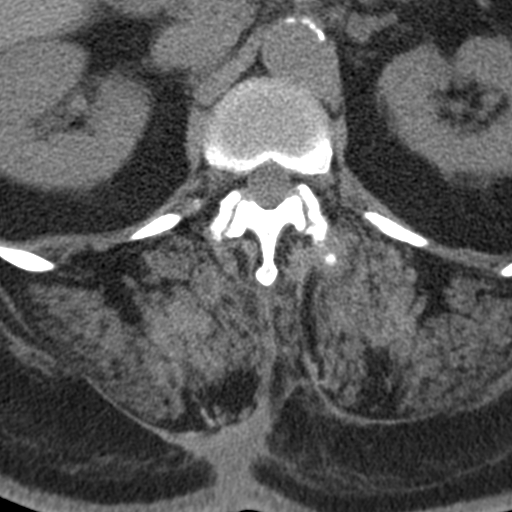
[im 62/75  bone]
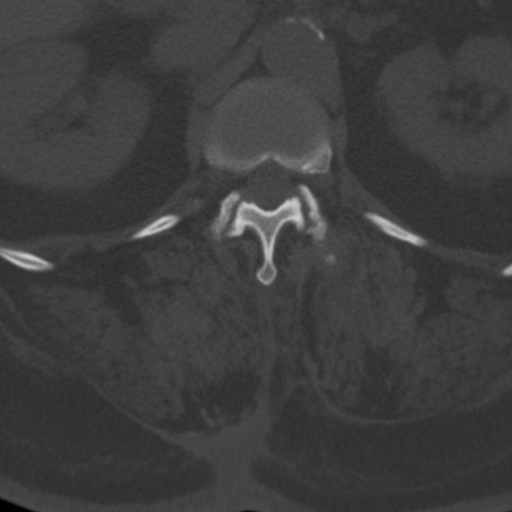

[5 of 14 positions shown; findings below may reference images not displayed]

FINDINGS: Segmentation: Normal lumbar segmentation. This is the same numbering
system used on 10/30/2016.

Alignment: Stable vertebral height and alignment since October 2016.

Vertebrae: Chronic bilateral pars fractures at L1. In addition,
there is a fracture through the superior aspect of the left L1
pedicle on sagittal image 36 which appears to be new since [REDACTED].
The right pedicle is intact. See additional details of L1-L2 below.

Solid posterior element and/or interbody arthrodesis from the L2
level to the sacrum. No acute osseous findings at those levels.
Stable visible sacrum and SI joints with chronic vacuum SI joint
phenomena.

Paraspinal and other soft tissues: Calcified aortic atherosclerosis.
Stable visualized noncontrast abdominal viscera. Stable
postoperative changes to the posterior paraspinal soft tissues.

Disc levels:

T12-L1:  Reported with the thoracic spine study today.

L1-L2: Sequelae of decompression and fusion. Left unilateral L1
pedicle fracture with increased lucency since 10/30/2016 indicating
loosening. Fracture along the superior aspect of the left L1 pedicle
also appears new from the prior CT. The remaining posterior elements
appear stable, including chronic L1 pars fractures. No posterior
element arthrodesis. Interbody implant with mildly increased chronic
endplate subsidence and sclerosis. Mild posterior positioning of the
right aspect of the implant is stable measuring 3-4 mm.

L2-L3: Bilateral L2 and L3 pedicle screws without loosening. Solid
posterior element arthrodesis. Moderate residual posterior element
hypertrophy. This level is stable.

L3-L4: Stable sequelae of decompression and fusion with solid
arthrodesis.

L4-L5: Stable sequelae of decompression and fusion with solid
arthrodesis. Stable residual in plate spurring.

L5-S1: Stable sequelae of decompression and fusion with anterior and
posterior element hardware and solid arthrodesis.
IMPRESSION: 1. Further loosening of the left L1 pedicle screw since the CT on
10/30/2016. Associated linear fracture through the superior aspect
of the left L1 pedicle. Superimposed chronic L1 pars fractures, but
the right L1 pedicle remains intact. Continued absent L1-L2
arthrodesis, and increased endplate subsidence/sclerosis.
2. Stable postoperative appearance and solid arthrodesis from the L2
level to the sacrum.
3.  Aortic Atherosclerosis ([id number]).

## 2018-02-28 DIAGNOSIS — G8918 Other acute postprocedural pain: Secondary | ICD-10-CM | POA: Diagnosis not present

## 2018-02-28 DIAGNOSIS — G894 Chronic pain syndrome: Secondary | ICD-10-CM | POA: Diagnosis not present

## 2018-02-28 DIAGNOSIS — R2681 Unsteadiness on feet: Secondary | ICD-10-CM | POA: Diagnosis not present

## 2018-03-04 DIAGNOSIS — R2681 Unsteadiness on feet: Secondary | ICD-10-CM | POA: Diagnosis not present

## 2018-03-04 DIAGNOSIS — G894 Chronic pain syndrome: Secondary | ICD-10-CM | POA: Diagnosis not present

## 2018-03-04 DIAGNOSIS — G8918 Other acute postprocedural pain: Secondary | ICD-10-CM | POA: Diagnosis not present

## 2018-03-06 DIAGNOSIS — G8918 Other acute postprocedural pain: Secondary | ICD-10-CM | POA: Diagnosis not present

## 2018-03-06 DIAGNOSIS — R2681 Unsteadiness on feet: Secondary | ICD-10-CM | POA: Diagnosis not present

## 2018-03-06 DIAGNOSIS — G894 Chronic pain syndrome: Secondary | ICD-10-CM | POA: Diagnosis not present

## 2018-03-15 DIAGNOSIS — F5089 Other specified eating disorder: Secondary | ICD-10-CM | POA: Diagnosis not present

## 2018-03-15 DIAGNOSIS — G8929 Other chronic pain: Secondary | ICD-10-CM | POA: Diagnosis not present

## 2018-03-15 DIAGNOSIS — M6281 Muscle weakness (generalized): Secondary | ICD-10-CM | POA: Diagnosis not present

## 2018-03-15 DIAGNOSIS — M960 Pseudarthrosis after fusion or arthrodesis: Secondary | ICD-10-CM | POA: Diagnosis not present

## 2018-03-15 DIAGNOSIS — I10 Essential (primary) hypertension: Secondary | ICD-10-CM | POA: Diagnosis not present

## 2018-03-15 DIAGNOSIS — R110 Nausea: Secondary | ICD-10-CM | POA: Diagnosis not present

## 2018-03-15 DIAGNOSIS — D510 Vitamin B12 deficiency anemia due to intrinsic factor deficiency: Secondary | ICD-10-CM | POA: Diagnosis not present

## 2018-03-15 DIAGNOSIS — Z4789 Encounter for other orthopedic aftercare: Secondary | ICD-10-CM | POA: Diagnosis not present

## 2018-03-15 DIAGNOSIS — K219 Gastro-esophageal reflux disease without esophagitis: Secondary | ICD-10-CM | POA: Diagnosis not present

## 2018-03-15 DIAGNOSIS — R609 Edema, unspecified: Secondary | ICD-10-CM | POA: Diagnosis not present

## 2018-03-15 DIAGNOSIS — Z79891 Long term (current) use of opiate analgesic: Secondary | ICD-10-CM | POA: Diagnosis not present

## 2018-03-15 DIAGNOSIS — Z981 Arthrodesis status: Secondary | ICD-10-CM | POA: Diagnosis not present

## 2018-03-15 DIAGNOSIS — Z79899 Other long term (current) drug therapy: Secondary | ICD-10-CM | POA: Diagnosis not present

## 2018-03-15 DIAGNOSIS — R51 Headache: Secondary | ICD-10-CM | POA: Diagnosis not present

## 2018-03-15 DIAGNOSIS — E785 Hyperlipidemia, unspecified: Secondary | ICD-10-CM | POA: Diagnosis not present

## 2018-03-18 DIAGNOSIS — M960 Pseudarthrosis after fusion or arthrodesis: Secondary | ICD-10-CM | POA: Diagnosis not present

## 2018-03-18 DIAGNOSIS — M6281 Muscle weakness (generalized): Secondary | ICD-10-CM | POA: Diagnosis not present

## 2018-03-18 DIAGNOSIS — I10 Essential (primary) hypertension: Secondary | ICD-10-CM | POA: Diagnosis not present

## 2018-03-18 DIAGNOSIS — M5440 Lumbago with sciatica, unspecified side: Secondary | ICD-10-CM | POA: Diagnosis not present

## 2018-03-18 DIAGNOSIS — R609 Edema, unspecified: Secondary | ICD-10-CM | POA: Diagnosis not present

## 2018-03-18 DIAGNOSIS — Z4789 Encounter for other orthopedic aftercare: Secondary | ICD-10-CM | POA: Diagnosis not present

## 2018-03-18 DIAGNOSIS — M48061 Spinal stenosis, lumbar region without neurogenic claudication: Secondary | ICD-10-CM | POA: Diagnosis not present

## 2018-03-18 DIAGNOSIS — D510 Vitamin B12 deficiency anemia due to intrinsic factor deficiency: Secondary | ICD-10-CM | POA: Diagnosis not present

## 2018-03-18 DIAGNOSIS — M542 Cervicalgia: Secondary | ICD-10-CM | POA: Diagnosis not present

## 2018-03-19 ENCOUNTER — Other Ambulatory Visit: Payer: Self-pay | Admitting: Neurosurgery

## 2018-03-19 DIAGNOSIS — M542 Cervicalgia: Secondary | ICD-10-CM

## 2018-03-20 DIAGNOSIS — M6281 Muscle weakness (generalized): Secondary | ICD-10-CM | POA: Diagnosis not present

## 2018-03-20 DIAGNOSIS — I10 Essential (primary) hypertension: Secondary | ICD-10-CM | POA: Diagnosis not present

## 2018-03-20 DIAGNOSIS — D510 Vitamin B12 deficiency anemia due to intrinsic factor deficiency: Secondary | ICD-10-CM | POA: Diagnosis not present

## 2018-03-20 DIAGNOSIS — R609 Edema, unspecified: Secondary | ICD-10-CM | POA: Diagnosis not present

## 2018-03-20 DIAGNOSIS — Z4789 Encounter for other orthopedic aftercare: Secondary | ICD-10-CM | POA: Diagnosis not present

## 2018-03-20 DIAGNOSIS — M960 Pseudarthrosis after fusion or arthrodesis: Secondary | ICD-10-CM | POA: Diagnosis not present

## 2018-03-21 DIAGNOSIS — M6281 Muscle weakness (generalized): Secondary | ICD-10-CM | POA: Diagnosis not present

## 2018-03-21 DIAGNOSIS — D510 Vitamin B12 deficiency anemia due to intrinsic factor deficiency: Secondary | ICD-10-CM | POA: Diagnosis not present

## 2018-03-21 DIAGNOSIS — M960 Pseudarthrosis after fusion or arthrodesis: Secondary | ICD-10-CM | POA: Diagnosis not present

## 2018-03-21 DIAGNOSIS — R609 Edema, unspecified: Secondary | ICD-10-CM | POA: Diagnosis not present

## 2018-03-21 DIAGNOSIS — Z4789 Encounter for other orthopedic aftercare: Secondary | ICD-10-CM | POA: Diagnosis not present

## 2018-03-21 DIAGNOSIS — I10 Essential (primary) hypertension: Secondary | ICD-10-CM | POA: Diagnosis not present

## 2018-03-24 DIAGNOSIS — Z4789 Encounter for other orthopedic aftercare: Secondary | ICD-10-CM | POA: Diagnosis not present

## 2018-03-24 DIAGNOSIS — M960 Pseudarthrosis after fusion or arthrodesis: Secondary | ICD-10-CM | POA: Diagnosis not present

## 2018-03-24 DIAGNOSIS — D510 Vitamin B12 deficiency anemia due to intrinsic factor deficiency: Secondary | ICD-10-CM | POA: Diagnosis not present

## 2018-03-24 DIAGNOSIS — R609 Edema, unspecified: Secondary | ICD-10-CM | POA: Diagnosis not present

## 2018-03-24 DIAGNOSIS — I10 Essential (primary) hypertension: Secondary | ICD-10-CM | POA: Diagnosis not present

## 2018-03-24 DIAGNOSIS — M6281 Muscle weakness (generalized): Secondary | ICD-10-CM | POA: Diagnosis not present

## 2018-03-24 IMAGING — CR DG CHEST 2V
2 series · 2 of 2 positions shown · non-contrast
Comparison: 07/20/2016 and 07/02/2016

CLINICAL DATA: Chest pain.  Preoperative exam.

EXAM:
CHEST  2 VIEW

[w chest pa]
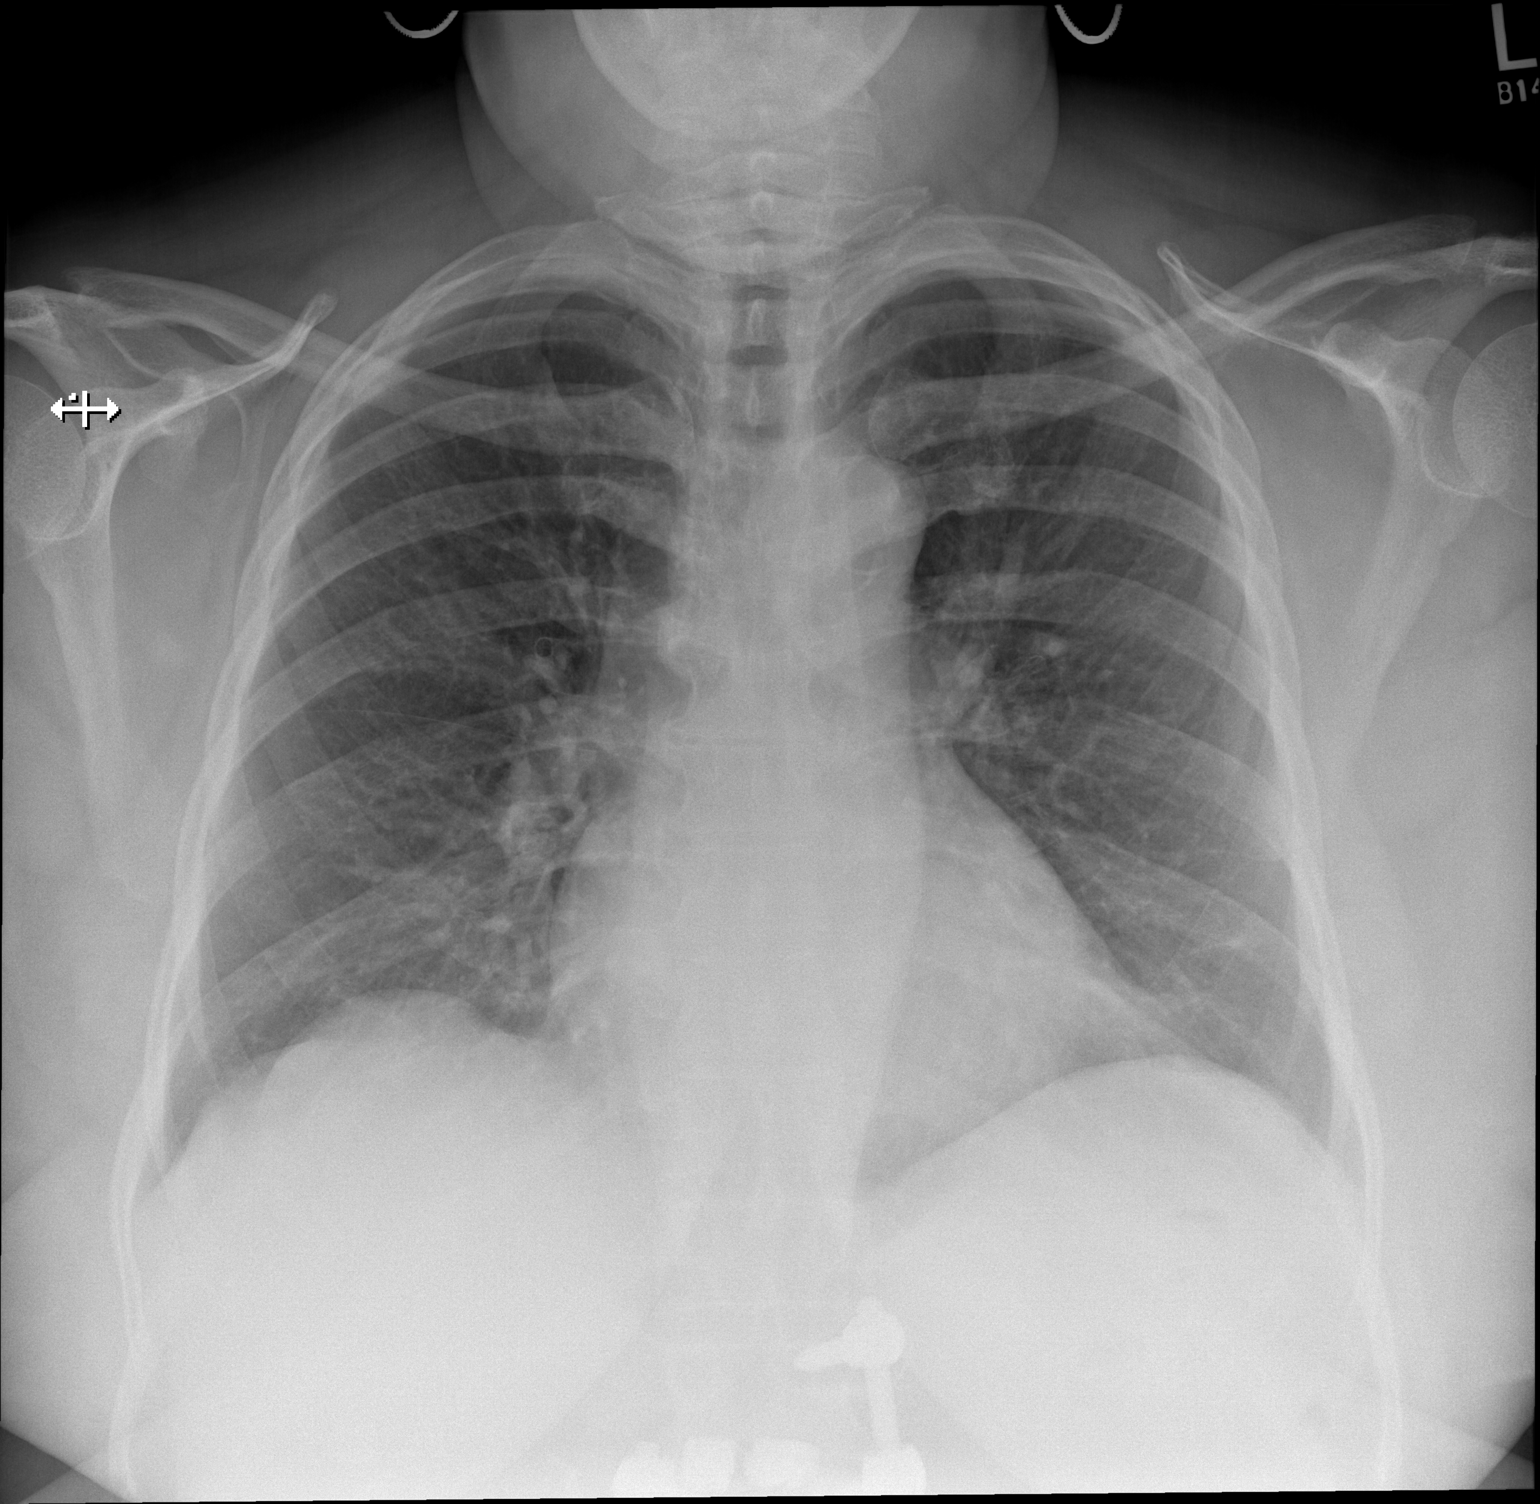

[w chest lat]
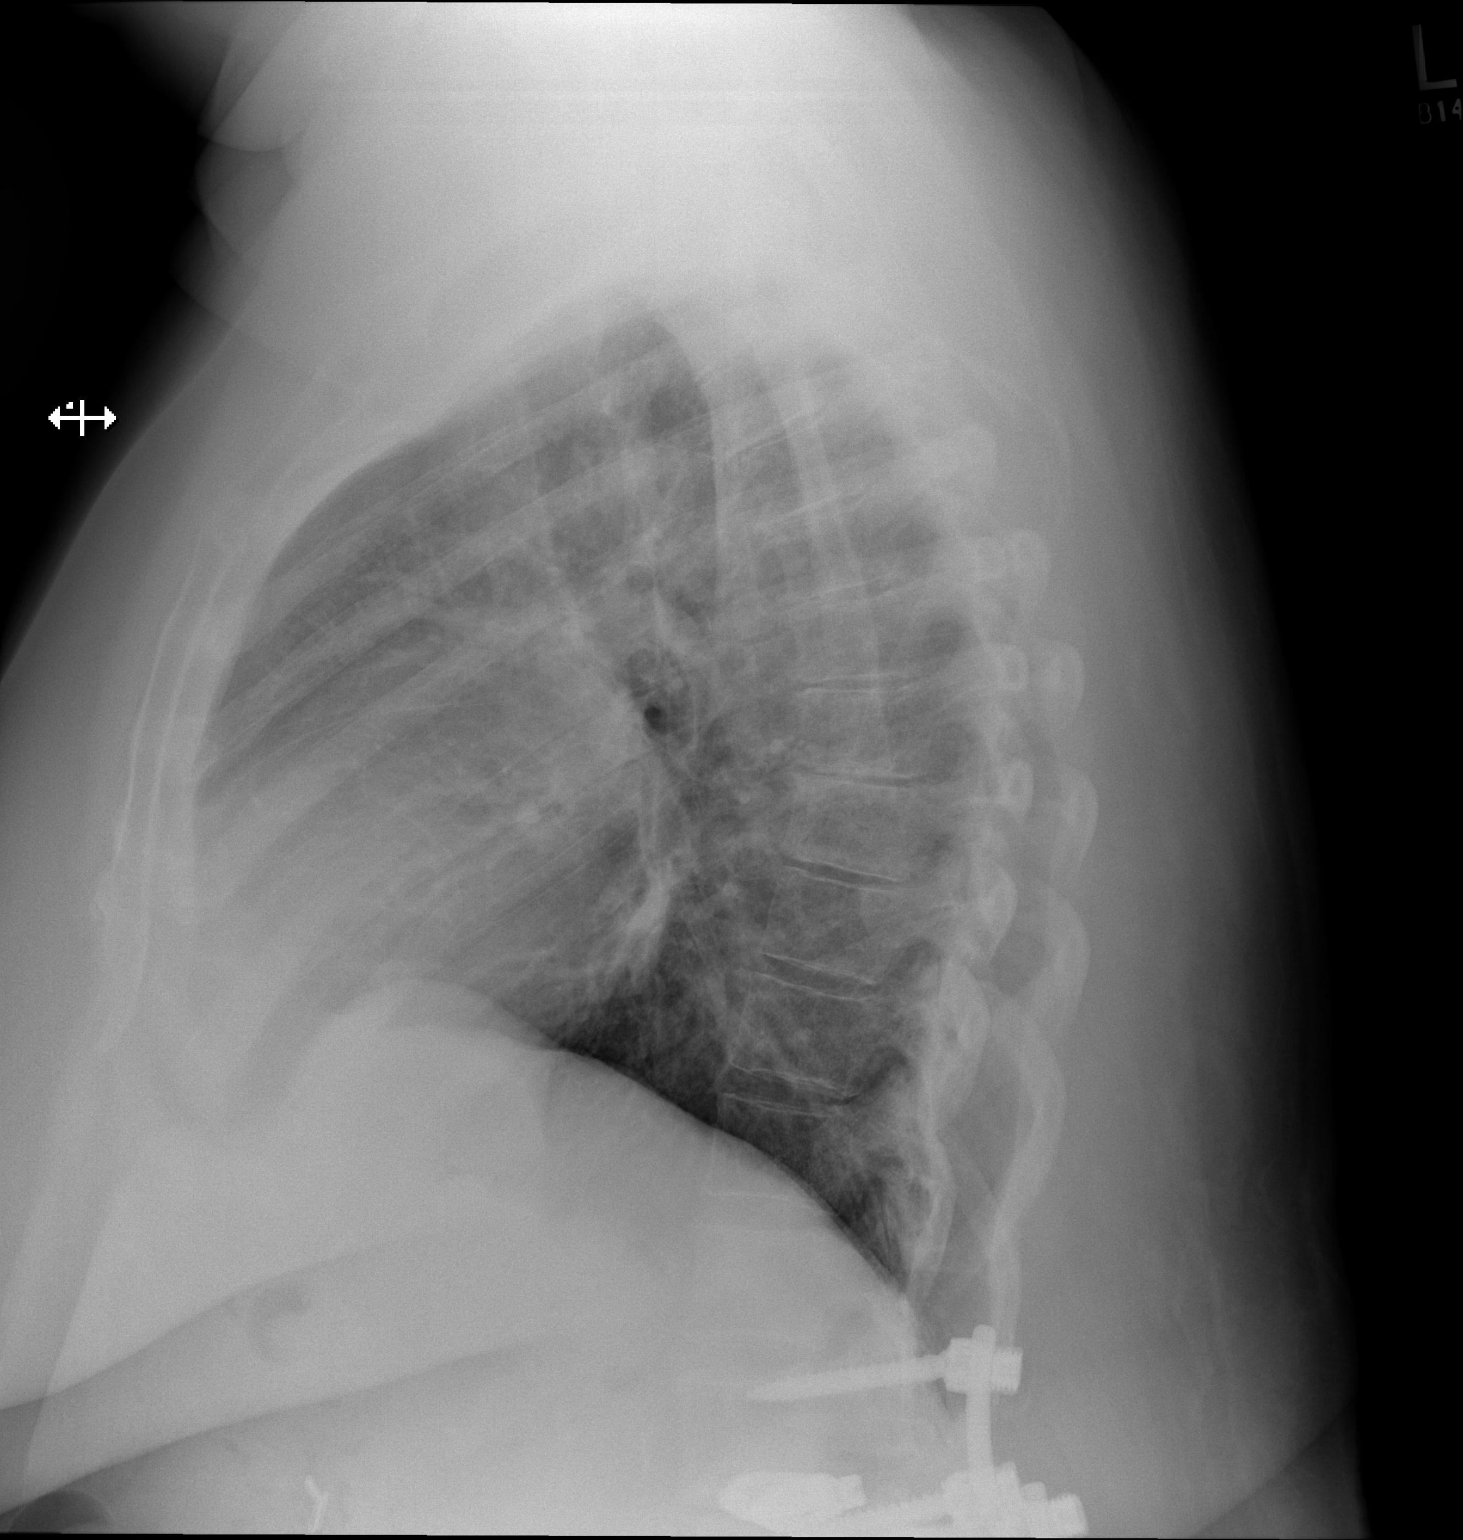

[2 of 2 positions shown; findings below may reference images not displayed]

FINDINGS: The heart size and mediastinal contours are within normal limits.
Both lungs are clear. The visualized skeletal structures are
unremarkable.

Aortic atherosclerosis.
IMPRESSION: No active cardiopulmonary disease.

Aortic Atherosclerosis ([id number]).

## 2018-03-26 DIAGNOSIS — D510 Vitamin B12 deficiency anemia due to intrinsic factor deficiency: Secondary | ICD-10-CM | POA: Diagnosis not present

## 2018-03-26 DIAGNOSIS — R609 Edema, unspecified: Secondary | ICD-10-CM | POA: Diagnosis not present

## 2018-03-26 DIAGNOSIS — Z4789 Encounter for other orthopedic aftercare: Secondary | ICD-10-CM | POA: Diagnosis not present

## 2018-03-26 DIAGNOSIS — I10 Essential (primary) hypertension: Secondary | ICD-10-CM | POA: Diagnosis not present

## 2018-03-26 DIAGNOSIS — M6281 Muscle weakness (generalized): Secondary | ICD-10-CM | POA: Diagnosis not present

## 2018-03-26 DIAGNOSIS — M960 Pseudarthrosis after fusion or arthrodesis: Secondary | ICD-10-CM | POA: Diagnosis not present

## 2018-03-27 DIAGNOSIS — M960 Pseudarthrosis after fusion or arthrodesis: Secondary | ICD-10-CM | POA: Diagnosis not present

## 2018-03-27 DIAGNOSIS — R609 Edema, unspecified: Secondary | ICD-10-CM | POA: Diagnosis not present

## 2018-03-27 DIAGNOSIS — M6281 Muscle weakness (generalized): Secondary | ICD-10-CM | POA: Diagnosis not present

## 2018-03-27 DIAGNOSIS — D510 Vitamin B12 deficiency anemia due to intrinsic factor deficiency: Secondary | ICD-10-CM | POA: Diagnosis not present

## 2018-03-27 DIAGNOSIS — Z4789 Encounter for other orthopedic aftercare: Secondary | ICD-10-CM | POA: Diagnosis not present

## 2018-03-27 DIAGNOSIS — I10 Essential (primary) hypertension: Secondary | ICD-10-CM | POA: Diagnosis not present

## 2018-03-28 DIAGNOSIS — I10 Essential (primary) hypertension: Secondary | ICD-10-CM | POA: Diagnosis not present

## 2018-03-28 DIAGNOSIS — Z4789 Encounter for other orthopedic aftercare: Secondary | ICD-10-CM | POA: Diagnosis not present

## 2018-03-28 DIAGNOSIS — R609 Edema, unspecified: Secondary | ICD-10-CM | POA: Diagnosis not present

## 2018-03-28 DIAGNOSIS — M6281 Muscle weakness (generalized): Secondary | ICD-10-CM | POA: Diagnosis not present

## 2018-03-28 DIAGNOSIS — M960 Pseudarthrosis after fusion or arthrodesis: Secondary | ICD-10-CM | POA: Diagnosis not present

## 2018-03-28 DIAGNOSIS — D510 Vitamin B12 deficiency anemia due to intrinsic factor deficiency: Secondary | ICD-10-CM | POA: Diagnosis not present

## 2018-03-31 ENCOUNTER — Ambulatory Visit
Admission: RE | Admit: 2018-03-31 | Discharge: 2018-03-31 | Disposition: A | Discharge status: Home / Self Care | Payer: Medicare Other | Source: Ambulatory Visit | Attending: Neurosurgery | Admitting: Neurosurgery

## 2018-03-31 DIAGNOSIS — M542 Cervicalgia: Secondary | ICD-10-CM

## 2018-03-31 DIAGNOSIS — M4802 Spinal stenosis, cervical region: Secondary | ICD-10-CM | POA: Diagnosis not present

## 2018-04-01 DIAGNOSIS — M960 Pseudarthrosis after fusion or arthrodesis: Secondary | ICD-10-CM | POA: Diagnosis not present

## 2018-04-01 DIAGNOSIS — Z4789 Encounter for other orthopedic aftercare: Secondary | ICD-10-CM | POA: Diagnosis not present

## 2018-04-01 DIAGNOSIS — M6281 Muscle weakness (generalized): Secondary | ICD-10-CM | POA: Diagnosis not present

## 2018-04-01 DIAGNOSIS — R609 Edema, unspecified: Secondary | ICD-10-CM | POA: Diagnosis not present

## 2018-04-01 DIAGNOSIS — D510 Vitamin B12 deficiency anemia due to intrinsic factor deficiency: Secondary | ICD-10-CM | POA: Diagnosis not present

## 2018-04-01 DIAGNOSIS — I10 Essential (primary) hypertension: Secondary | ICD-10-CM | POA: Diagnosis not present

## 2018-04-02 ENCOUNTER — Encounter (HOSPITAL_COMMUNITY): Payer: Self-pay | Admitting: Neurosurgery

## 2018-04-02 DIAGNOSIS — M960 Pseudarthrosis after fusion or arthrodesis: Secondary | ICD-10-CM | POA: Diagnosis not present

## 2018-04-02 DIAGNOSIS — I10 Essential (primary) hypertension: Secondary | ICD-10-CM | POA: Diagnosis not present

## 2018-04-02 DIAGNOSIS — D510 Vitamin B12 deficiency anemia due to intrinsic factor deficiency: Secondary | ICD-10-CM | POA: Diagnosis not present

## 2018-04-02 DIAGNOSIS — Z4789 Encounter for other orthopedic aftercare: Secondary | ICD-10-CM | POA: Diagnosis not present

## 2018-04-02 DIAGNOSIS — M6281 Muscle weakness (generalized): Secondary | ICD-10-CM | POA: Diagnosis not present

## 2018-04-02 DIAGNOSIS — R609 Edema, unspecified: Secondary | ICD-10-CM | POA: Diagnosis not present

## 2018-04-03 DIAGNOSIS — R609 Edema, unspecified: Secondary | ICD-10-CM | POA: Diagnosis not present

## 2018-04-03 DIAGNOSIS — M6281 Muscle weakness (generalized): Secondary | ICD-10-CM | POA: Diagnosis not present

## 2018-04-03 DIAGNOSIS — M960 Pseudarthrosis after fusion or arthrodesis: Secondary | ICD-10-CM | POA: Diagnosis not present

## 2018-04-03 DIAGNOSIS — D510 Vitamin B12 deficiency anemia due to intrinsic factor deficiency: Secondary | ICD-10-CM | POA: Diagnosis not present

## 2018-04-03 DIAGNOSIS — I10 Essential (primary) hypertension: Secondary | ICD-10-CM | POA: Diagnosis not present

## 2018-04-03 DIAGNOSIS — Z4789 Encounter for other orthopedic aftercare: Secondary | ICD-10-CM | POA: Diagnosis not present

## 2018-04-04 ENCOUNTER — Ambulatory Visit: Payer: Self-pay | Admitting: Podiatry

## 2018-04-05 ENCOUNTER — Emergency Department (HOSPITAL_COMMUNITY): Payer: Medicare Other

## 2018-04-05 ENCOUNTER — Encounter (HOSPITAL_COMMUNITY): Payer: Self-pay | Admitting: Radiology

## 2018-04-05 ENCOUNTER — Emergency Department (HOSPITAL_COMMUNITY)
Admission: EM | Admit: 2018-04-05 | Discharge: 2018-04-05 | Disposition: A | Discharge status: Home / Self Care | Payer: Medicare Other | Attending: Emergency Medicine | Admitting: Emergency Medicine

## 2018-04-05 ENCOUNTER — Other Ambulatory Visit: Payer: Self-pay

## 2018-04-05 DIAGNOSIS — R079 Chest pain, unspecified: Secondary | ICD-10-CM | POA: Diagnosis not present

## 2018-04-05 DIAGNOSIS — J45909 Unspecified asthma, uncomplicated: Secondary | ICD-10-CM | POA: Insufficient documentation

## 2018-04-05 DIAGNOSIS — Z87891 Personal history of nicotine dependence: Secondary | ICD-10-CM | POA: Diagnosis not present

## 2018-04-05 DIAGNOSIS — R5383 Other fatigue: Secondary | ICD-10-CM | POA: Diagnosis not present

## 2018-04-05 DIAGNOSIS — Z91040 Latex allergy status: Secondary | ICD-10-CM | POA: Insufficient documentation

## 2018-04-05 DIAGNOSIS — Z79899 Other long term (current) drug therapy: Secondary | ICD-10-CM | POA: Insufficient documentation

## 2018-04-05 DIAGNOSIS — R0602 Shortness of breath: Secondary | ICD-10-CM | POA: Diagnosis not present

## 2018-04-05 DIAGNOSIS — R531 Weakness: Secondary | ICD-10-CM | POA: Diagnosis not present

## 2018-04-05 DIAGNOSIS — R0902 Hypoxemia: Secondary | ICD-10-CM | POA: Diagnosis not present

## 2018-04-05 DIAGNOSIS — R0789 Other chest pain: Secondary | ICD-10-CM | POA: Diagnosis not present

## 2018-04-05 DIAGNOSIS — R000 Tachycardia, unspecified: Secondary | ICD-10-CM | POA: Diagnosis not present

## 2018-04-05 DIAGNOSIS — I10 Essential (primary) hypertension: Secondary | ICD-10-CM | POA: Insufficient documentation

## 2018-04-05 LAB — CBC WITH DIFFERENTIAL/PLATELET
Abs Immature Granulocytes: 0 10*3/uL (ref 0.0–0.1)
Basophils Absolute: 0 10*3/uL (ref 0.0–0.1)
Basophils Relative: 0 %
Eosinophils Absolute: 0.1 10*3/uL (ref 0.0–0.7)
Eosinophils Relative: 1 %
HCT: 38.4 % (ref 36.0–46.0)
Hemoglobin: 12 g/dL (ref 12.0–15.0)
Immature Granulocytes: 0 %
Lymphocytes Relative: 29 %
Lymphs Abs: 1.4 10*3/uL (ref 0.7–4.0)
MCH: 28.6 pg (ref 26.0–34.0)
MCHC: 31.3 g/dL (ref 30.0–36.0)
MCV: 91.6 fL (ref 78.0–100.0)
Monocytes Absolute: 0.7 10*3/uL (ref 0.1–1.0)
Monocytes Relative: 15 %
Neutro Abs: 2.6 10*3/uL (ref 1.7–7.7)
Neutrophils Relative %: 55 %
Platelets: 338 10*3/uL (ref 150–400)
RBC: 4.19 MIL/uL (ref 3.87–5.11)
RDW: 14.1 % (ref 11.5–15.5)
WBC: 4.7 10*3/uL (ref 4.0–10.5)

## 2018-04-05 LAB — I-STAT TROPONIN, ED: Troponin i, poc: 0 ng/mL (ref 0.00–0.08)

## 2018-04-05 LAB — COMPREHENSIVE METABOLIC PANEL
ALT: 15 U/L (ref 0–44)
AST: 20 U/L (ref 15–41)
Albumin: 3.4 g/dL — ABNORMAL LOW (ref 3.5–5.0)
Alkaline Phosphatase: 133 U/L — ABNORMAL HIGH (ref 38–126)
Anion gap: 9 (ref 5–15)
BUN: 13 mg/dL (ref 8–23)
CO2: 26 mmol/L (ref 22–32)
Calcium: 9.2 mg/dL (ref 8.9–10.3)
Chloride: 104 mmol/L (ref 98–111)
Creatinine, Ser: 0.96 mg/dL (ref 0.44–1.00)
GFR calc Af Amer: >60 mL/min (ref >60)
GFR calc non Af Amer: 58 mL/min — ABNORMAL LOW (ref >60)
Glucose, Bld: 111 mg/dL — ABNORMAL HIGH (ref 70–99)
Potassium: 3.7 mmol/L (ref 3.5–5.1)
Sodium: 139 mmol/L (ref 135–145)
Total Bilirubin: 0.3 mg/dL (ref 0.3–1.2)
Total Protein: 7.2 g/dL (ref 6.5–8.1)

## 2018-04-05 LAB — BRAIN NATRIURETIC PEPTIDE: B Natriuretic Peptide: 24.7 pg/mL (ref 0.0–100.0)

## 2018-04-05 LAB — I-STAT CHEM 8, ED
BUN: 15 mg/dL (ref 8–23)
Calcium, Ion: 1.2 mmol/L (ref 1.15–1.40)
Chloride: 102 mmol/L (ref 98–111)
Creatinine, Ser: 1 mg/dL (ref 0.44–1.00)
Glucose, Bld: 113 mg/dL — ABNORMAL HIGH (ref 70–99)
HCT: 36 % (ref 36.0–46.0)
Hemoglobin: 12.2 g/dL (ref 12.0–15.0)
Potassium: 3.7 mmol/L (ref 3.5–5.1)
Sodium: 140 mmol/L (ref 135–145)
TCO2: 27 mmol/L (ref 22–32)

## 2018-04-05 LAB — TROPONIN I
Troponin I: <0.03 ng/mL (ref <0.03)
Troponin I: <0.03 ng/mL (ref <0.03)

## 2018-04-05 LAB — LIPASE, BLOOD: Lipase: 37 U/L (ref 11–51)

## 2018-04-05 MED ORDER — SODIUM CHLORIDE 0.9 % IV BOLUS
1000.0000 mL | Freq: Once | INTRAVENOUS | Status: AC
Start: 2018-04-05 — End: 2018-04-05
  Administered 2018-04-05: 1000 mL via INTRAVENOUS

## 2018-04-05 MED ORDER — IOPAMIDOL (ISOVUE-370) INJECTION 76%
80.0000 mL | Freq: Once | INTRAVENOUS | Status: AC | PRN
  Administered 2018-04-05: 80 mL via INTRAVENOUS

## 2018-04-05 MED ORDER — MORPHINE SULFATE (PF) 4 MG/ML IV SOLN
4.0000 mg | Freq: Once | INTRAVENOUS | Status: AC
  Administered 2018-04-05: 4 mg via INTRAVENOUS
  Filled 2018-04-05: qty 1

## 2018-04-05 MED ORDER — ONDANSETRON HCL 4 MG/2ML IJ SOLN
4.0000 mg | Freq: Once | INTRAMUSCULAR | Status: AC
  Administered 2018-04-05: 4 mg via INTRAVENOUS
  Filled 2018-04-05: qty 2

## 2018-04-05 MED ORDER — IOPAMIDOL (ISOVUE-370) INJECTION 76%
INTRAVENOUS | Status: AC
  Filled 2018-04-05: qty 100

## 2018-04-05 MED ORDER — TRAMADOL HCL 50 MG PO TABS: 50.0000 mg | ORAL_TABLET | Freq: Four times a day (QID) | ORAL | 0 refills | Status: DC | PRN

## 2018-04-05 NOTE — ED Provider Notes (Addendum)
[facility] EMERGENCY DEPARTMENT Provider Note   CSN: [id number] Arrival date & time: 04/05/18  1248     History   Chief Complaint Chief Complaint  Patient presents with  . Chest Pain    HPI Amy [name] is a 72 y.o. female.  HPI   72 year old female with extensive past medical history as below, here with chest pain.  The patient states that earlier this morning, she developed acute onset of a sharp, but also pressure-like sensation in her mid chest and left upper chest.  The pain is worse with deep inspiration.  It is associated with shortness of breath and some cough.  The pain has been persistent and worsening since onset.  Denies known history of coronary disease.  She does states she had a history of a blood clot in her left upper arm, but this was after having a line in it during a hospitalization, and she is no longer on blood thinners.  No dyspnea with exertion.  No fever.  No cough or sputum production.  No hemoptysis. No recent immobilization or leg swelling.  Past Medical History:  Diagnosis Date  . Abdominal pain of unknown etiology 02/05/2016  . Anxiety   . Arthritis    ddd- RA  . Asthma    "sleeping asthma"  . Chronic back pain    lumbar steroid injection's  . Complication of anesthesia    Hard to wake up. Pt sts is took 3 days.  . Constipation   . Depression   . Dysphonia    intermittent "voice changes"  . Fibromyalgia   . GERD (gastroesophageal reflux disease)   . Glaucoma   . Headache   . History of DVT (deep vein thrombosis) 1989   LEFT UPPER ARM  . History of hiatal hernia   . History of MRSA infection 2011  . Hypertension   . Irregular heart rate    "years ago"  . Low iron   . Lumbago   . Mild obstructive sleep apnea    per study 02-07-2006 - no cpap  . Neuropathy    feet  . Pelvic pain in female   . Peripheral vascular disease ([place])    "poor circulation"  . SBO (small bowel obstruction) ([place]) 06/2016  .  Seasonal allergies   . Short of breath on exertion   . Urinary frequency   . Weakness    both hands and feet    Patient Active Problem List   Diagnosis Date Noted  . Wound dehiscence 07/22/2016  . Dyspnea 07/21/2016  . Acute encephalopathy 07/21/2016  . Anemia 07/21/2016  . Pancreatitis 07/02/2016  . SBO (small bowel obstruction) ([place]) 07/02/2016  . Long term current use of opiate analgesic 06/26/2016  . Long term prescription opiate use 06/26/2016  . Opiate use 06/26/2016  . Encounter for therapeutic drug level monitoring 06/26/2016  . Encounter for pain management planning 06/26/2016  . Chronic pain 06/26/2016  . Disturbance of skin sensation 06/26/2016  . Chronic low back pain (Location of Primary Source of Pain) (Bilateral) (R>L) 06/26/2016  . Chronic hip pain (Location of Secondary source of pain) (Bilateral) (R>L) 06/26/2016  . 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 Chronic shoulder pain (Location of Tertiary source of pain) (Bilateral) (R>L) 06/26/2016  . 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 Chronic knee pain (Bilateral) (R>L) 06/26/2016  . 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 Chronic neck pain (Right) 06/26/2016  . Lumbar spondylosis 06/26/2016  .  Lumbar facet syndrome 06/26/2016  . Lumbar facet hypertrophy 06/26/2016  . 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 Neurogenic pain 06/26/2016  . Occipital headaches 06/26/2016  . 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 History of lumbar fusion 06/26/2016  . 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  . Neuropathic pain of both legs 10/13/2015  . Constipation 10/13/2015  . Complete rotator cuff tear of left shoulder 05/05/2014  . Depression 08/06/2013  . Hypokalemia 08/06/2013  . GERD (gastroesophageal reflux disease) 08/06/2013  . Spinal stenosis of lumbar region 08/06/2013  . Bladder spasm 08/06/2013  . Obesity, Class III,  BMI 40-49.9 (morbid obesity) ([place]) 02/03/2013  . Lipoma of buttock s/p excision 02/20/2013 02/03/2013  . DYSPNEA 01/23/2008  . Obstructive sleep apnea 09/12/2007  . HTN (hypertension) 09/12/2007  . Allergic rhinitis 09/12/2007  . SLEEPINESS 09/12/2007  . ANGINA, HX OF 09/12/2007    Past Surgical History:  Procedure Laterality Date  . abdominal adhesions removed    . ABDOMINAL HYSTERECTOMY  1978  . BACK SURGERY    . BUNIONECTOMY Left 2008  . COLON SURGERY    . COLONOSCOPY    . CYSTO WITH HYDRODISTENSION N/A 07/12/2015   Procedure: CYSTOSCOPY/HYDRODISTENSION;  Surgeon: Bjorn Loser, MD;  [facility];  Service: Urology;  Laterality: N/A;  . DILATION AND CURETTAGE OF UTERUS    . ESOPHAGEAL MANOMETRY N/A 11/22/2014   Procedure: ESOPHAGEAL MANOMETRY (EM);  Surgeon: Winfield Cunas., MD;  Location: [facility];  Service: Endoscopy;  Laterality: N/A;  . ESOPHAGOGASTRODUODENOSCOPY N/A 07/15/2013   Procedure: ESOPHAGOGASTRODUODENOSCOPY (EGD);  Surgeon: Winfield Cunas., MD;  Location: [facility];  Service: Endoscopy;  Laterality: N/A;  need xray  . EYE SURGERY Bilateral    cataract surgery with lens implants  . HARDWARE REMOVAL N/A 01/02/2016   Procedure: Exploration of Lumbar Fusion,Removal of hardware Lumbar One-Two ;Redo Posterior Lumbar Fusion Lumbar One-Two;  Surgeon: Kary Kos, MD;  Location: [facility];  Service: Neurosurgery;  Laterality: N/A;  . LAPAROSCOPIC CHOLECYSTECTOMY  01-11-2006  . LAPAROSCOPY N/A 07/06/2016   Procedure: LAPAROSCOPY DIAGNOSTIC EMERGENT TO OPEN;  Surgeon: Greer Pickerel, MD;  Location: [facility];  Service: General;  Laterality: N/A;  . LAPAROTOMY N/A 07/06/2016   Procedure: EXPLORATORY LAPAROTOMY LYSIS OF ADHESIONS FOR 3 HOURS;  Surgeon: Greer Pickerel, MD;  Location: [facility];  Service: General;  Laterality: N/A;  . LUMBAR FUSION  2014   L4 -- L5  . MASS EXCISION Left 02/20/2013   Procedure: EXCISION LEFT BUTTOCK  MASS;  Surgeon:  Adin Hector, MD;  Location: [facility];  Service: General;  Laterality: Left;  . NOSE SURGERY  2007  . REMOVAL HARDWARE L4-L5/  BILATERAL LAMINECTOMY L2 - L5 AND FUSION  06-12-2011  . SHOULDER OPEN ROTATOR CUFF REPAIR Left 05/05/2014   Procedure: OPEN ACROMIONECTOMY AND OPEN REPAIR OF ROTATOR CUFF, TISSUEMEND GRAFT WITH ANCHOR ;  Surgeon: Tobi Bastos, MD;  Location: [facility];  Service: Orthopedics;  Laterality: Left;  . SPINE SURGERY    . TONSILLECTOMY       OB History   None      Home Medications    Prior to Admission medications   Medication Sig Start Date End Date Taking? Authorizing Provider  acetaminophen (TYLENOL) 500 MG tablet Take 1,000 mg by mouth 2 (two) times daily as needed for moderate pain.    Yes [provider]  azelaic acid (AZELEX) 20 % cream Apply 1 application topically 2 (two)  times daily. After skin is thoroughly washed and patted dry, gently but thoroughly massage a thin film of azelaic acid cream into the affected area twice daily, in the morning and evening.   Yes [provider]  calcium-vitamin D (OSCAL WITH D) 500-200 MG-UNIT tablet Take 1 tablet by mouth daily with breakfast. 08/02/16  Yes Meuth, Brooke A, PA-C  cloNIDine (CATAPRES) 0.1 MG tablet Take 1 tablet (0.1 mg total) by mouth 3 (three) times daily. 08/01/16  Yes Meuth, Brooke A, PA-C  cyclobenzaprine (FLEXERIL) 10 MG tablet Take 1 tablet (10 mg total) by mouth 3 (three) times daily as needed for muscle spasms. 02/18/18  Yes Meyran, Ocie Cornfield, NP  diphenhydrAMINE (BENADRYL) 25 MG tablet Take 25 mg by mouth 2 (two) times daily as needed for allergies.   Yes [provider]  estradiol (ESTRACE) 0.1 MG/GM vaginal cream Place 1 Applicatorful vaginally 2 (two) times a week. Patient taking differently: Place 1 Applicatorful vaginally every 14 (fourteen) days. Tuesday and Thursday 08/02/16  Yes Meuth, Brooke A, PA-C  fluticasone (FLONASE) 50 MCG/ACT nasal spray Place 1 spray into  both nostrils daily. Patient taking differently: Place 1 spray into both nostrils daily as needed for allergies.  08/02/16  Yes Meuth, Brooke A, PA-C  gabapentin (NEURONTIN) 300 MG capsule Take 300 mg by mouth 2 (two) times daily.   Yes [provider]  hydrALAZINE (APRESOLINE) 25 MG tablet Take 25 mg by mouth 3 (three) times daily with meals.    Yes [provider]  Menthol-Methyl Salicylate (MUSCLE RUB EX) Apply 1 application topically 2 (two) times daily as needed (pain in neck and back).    Yes [provider]  metoprolol (LOPRESSOR) 50 MG tablet Take 1 tablet (50 mg total) by mouth 2 (two) times daily. 08/01/16  Yes Meuth, Brooke A, PA-C  Multiple Vitamins-Minerals (CENTRUM SILVER PO) Take 1 tablet by mouth daily.   Yes [provider]  NIFEdipine (PROCARDIA) 10 MG capsule Take 10 mg by mouth 3 (three) times daily.   Yes [provider]  ondansetron (ZOFRAN) 4 MG tablet Take 1 tablet (4 mg total) by mouth every 6 (six) hours as needed for nausea. 08/01/16  Yes Meuth, Brooke A, PA-C  OVER THE COUNTER MEDICATION Take 2 tablets by mouth daily. Swiss Kriss OTC herbal laxative   Yes [provider]  oxyCODONE-acetaminophen (PERCOCET) 5-325 MG tablet Take 1 tablet by mouth every 4 (four) hours as needed for severe pain. 02/18/18 02/18/19 Yes Meyran, Ocie Cornfield, NP  pantoprazole (PROTONIX) 40 MG tablet Take 40 mg by mouth daily.  01/17/18  Yes [provider]  PARoxetine (PAXIL) 40 MG tablet Take 40 mg by mouth daily.    Yes [provider]  polyethylene glycol (MIRALAX / GLYCOLAX) packet Take 17 g by mouth daily. Patient taking differently: Take 17 g by mouth daily as needed for mild constipation.  08/02/16  Yes Meuth, Brooke A, PA-C  potassium chloride SA (K-DUR,KLOR-CON) 20 MEQ tablet Take 20 mEq by mouth daily.   Yes [provider]  Probiotic Product (PROBIOTIC PO) Take 1 capsule by mouth daily.   Yes [provider]  Propylene Glycol (SYSTANE COMPLETE) 0.6 % SOLN Place 1 drop into both eyes 2 (two) times daily as needed (dry eyes).   Yes [provider]  tapentadol (NUCYNTA) 50 MG tablet Take 50 mg by mouth every 6 (six) hours as needed for moderate pain.   Yes [provider]  vitamin B-12 (CYANOCOBALAMIN)  1000 MCG tablet Take 1,000 mcg by mouth daily.   Yes [provider]  vitamin E (VITAMIN E) 400 UNIT capsule Take 400 Units by mouth daily.   Yes [provider]    Family History Family History  Problem Relation Age of Onset  . Hypertension Mother   . Cancer Mother        cervix  . Kidney disease Mother   . Hypertension Sister   . Cancer Sister        polyps  . Kidney disease Brother   . Cancer Daughter        leukemia  . Hypertension Brother     Social History Social History   Tobacco Use  . Smoking status: Former Smoker    Last attempt to quit: 07/10/1969    Years since quitting: 48.7  . Smokeless tobacco: Never Used  Substance Use Topics  . Alcohol use: Yes    Comment: rare  . Drug use: No     Allergies   Latex; Penicillins; and Tomato   Review of Systems Review of Systems  Constitutional: Positive for fatigue. Negative for chills and fever.  HENT: Negative for congestion and rhinorrhea.   Eyes: Negative for visual disturbance.  Respiratory: Positive for chest tightness and shortness of breath. Negative for cough and wheezing.   Cardiovascular: Positive for chest pain. Negative for leg swelling.  Gastrointestinal: Negative for abdominal pain, diarrhea, nausea and vomiting.  Genitourinary: Negative for dysuria and flank pain.  Musculoskeletal: Negative for neck pain and neck stiffness.  Skin: Negative for rash and wound.  Allergic/Immunologic: Negative for immunocompromised state.  Neurological: Positive for weakness. Negative for syncope and headaches.  All other systems reviewed and are negative.    Physical  Exam Updated Vital Signs BP 118/79   Pulse 99   Temp 99.5 F (37.5 C) (Oral)   Resp 16   Ht 5\' 5"  (1.651 m)   Wt 120.2 kg (265 lb)   SpO2 96%   BMI 44.10 kg/m   Physical Exam  Constitutional: She is oriented to person, place, and time. She appears well-developed and well-nourished. No distress.  HENT:  Head: Normocephalic and atraumatic.  Eyes: Conjunctivae are normal.  Neck: Neck supple.  Cardiovascular: Regular rhythm and normal heart sounds. Tachycardia present. Exam reveals no friction rub.  No murmur heard. Pulmonary/Chest: Effort normal and breath sounds normal. Tachypnea noted. No respiratory distress. She has no wheezes. She has no rales.  Mild tenderness to palpation over anterior left chest wall  Abdominal: She exhibits no distension.  Musculoskeletal: She exhibits no edema.  Neurological: She is alert and oriented to person, place, and time. She exhibits normal muscle tone.  Skin: Skin is warm. Capillary refill takes less than 2 seconds.  Psychiatric: She has a normal mood and affect.  Nursing note and vitals reviewed.    ED Treatments / Results  Labs (all labs ordered are listed, but only abnormal results are displayed) Labs Reviewed  COMPREHENSIVE METABOLIC PANEL - Abnormal; Notable for the following components:      Result Value   Glucose, Bld 111 (*)    Albumin 3.4 (*)    Alkaline Phosphatase 133 (*)    GFR calc non Af Amer 58 (*)    All other components within normal limits  I-STAT CHEM 8, ED - Abnormal; Notable for the following components:   Glucose, Bld 113 (*)    All other components within normal limits  CBC WITH DIFFERENTIAL/PLATELET  LIPASE, BLOOD  TROPONIN I  BRAIN NATRIURETIC PEPTIDE  I-STAT TROPONIN, ED    EKG EKG Interpretation  Date/Time:  Saturday April 05 2018 12:59:41 EDT Ventricular Rate:  109 PR Interval:    QRS Duration: 80 QT Interval:  340 QTC Calculation: 458 R Axis:   -41 Text Interpretation:  Sinus tachycardia Left  anterior fascicular block Consider anterior infarct Since last EKG, rate is increased Confirmed by Duffy Bruce [phone]) on 04/05/2018 1:07:07 PM   Radiology Dg Chest 2 View  Result Date: 04/05/2018 CLINICAL DATA:  Per patient Chest pain upper right side x 24 hours. "Feels like an elephant's foot on my chest". Hx HTN EXAM: CHEST - 2 VIEW COMPARISON:  10/21/2017 FINDINGS: Cardiac silhouette is normal in size. No mediastinal or hilar masses. No evidence of adenopathy. Clear lungs.  No pleural effusion or pneumothorax. There stable changes from a previous thoracolumbar spine fusion. IMPRESSION: No active cardiopulmonary disease. Electronically Signed   By: Lajean Manes M.D.   On: 04/05/2018 14:32    Procedures Procedures (including critical care time)  Medications Ordered in ED Medications  sodium chloride 0.9 % bolus 1,000 mL (has no administration in time range)  morphine 4 MG/ML injection 4 mg (has no administration in time range)  ondansetron (ZOFRAN) injection 4 mg (has no administration in time range)     Initial Impression / Assessment and Plan / ED Course  I have reviewed the triage vital signs and the nursing notes.  Pertinent labs & imaging results that were available during my care of the patient were reviewed by me and considered in my medical decision making (see chart for details).     72 year old female with history as above here with somewhat atypical, sharp, positional chest pain.  Patient also with a pressure-like component, however.  Patient is tachycardic on arrival.  Initial differential includes PE, musculoskeletal chest wall pain, less likely ACS.  Chest x-ray is clear.  Initial troponin is negative and this is reassuring in the presence of persistent pain.  Will check a CT angios given her high risk with history of clot in the past, plan for at least delta troponin in setting of constant pain x hours.  Symptoms are improving with Ativan. F/u CT scan re: PE, CAD,  etc.  Patient care transferred to Dr. Lita Mains at the end of my shift. Patient presentation, ED course, and plan of care discussed with review of all pertinent labs and imaging. Please see his/her note for further details regarding further ED course and disposition.  Final Clinical Impressions(s) / ED Diagnoses   Final diagnoses:  Atypical chest pain    ED Discharge Orders    None       Duffy Bruce, MD 04/05/18 1556    Duffy Bruce, MD 04/05/18 1730

## 2018-04-05 NOTE — ED Notes (Signed)
Patient transported to CT 

## 2018-04-05 NOTE — ED Triage Notes (Signed)
Per patient Chest pain upper right side x 24 hours. "Feels like an elephant's foot on my chest".

## 2018-04-14 DIAGNOSIS — F39 Unspecified mood [affective] disorder: Secondary | ICD-10-CM | POA: Diagnosis not present

## 2018-04-14 DIAGNOSIS — I10 Essential (primary) hypertension: Secondary | ICD-10-CM | POA: Diagnosis not present

## 2018-04-14 DIAGNOSIS — M542 Cervicalgia: Secondary | ICD-10-CM | POA: Diagnosis not present

## 2018-04-15 ENCOUNTER — Encounter: Payer: Self-pay | Admitting: Sports Medicine

## 2018-04-15 ENCOUNTER — Ambulatory Visit (INDEPENDENT_AMBULATORY_CARE_PROVIDER_SITE_OTHER): Payer: Medicare Other | Admitting: Sports Medicine

## 2018-04-15 ENCOUNTER — Encounter

## 2018-04-15 ENCOUNTER — Other Ambulatory Visit: Payer: Self-pay | Admitting: Sports Medicine

## 2018-04-15 ENCOUNTER — Ambulatory Visit: Payer: Self-pay | Admitting: Podiatry

## 2018-04-15 ENCOUNTER — Ambulatory Visit (INDEPENDENT_AMBULATORY_CARE_PROVIDER_SITE_OTHER): Payer: Medicare Other

## 2018-04-15 ENCOUNTER — Other Ambulatory Visit: Payer: Self-pay

## 2018-04-15 DIAGNOSIS — M19079 Primary osteoarthritis, unspecified ankle and foot: Secondary | ICD-10-CM | POA: Diagnosis not present

## 2018-04-15 DIAGNOSIS — M779 Enthesopathy, unspecified: Secondary | ICD-10-CM

## 2018-04-15 DIAGNOSIS — M79671 Pain in right foot: Secondary | ICD-10-CM

## 2018-04-15 DIAGNOSIS — M79672 Pain in left foot: Secondary | ICD-10-CM

## 2018-04-15 MED ORDER — METHYLPREDNISOLONE 4 MG PO TBPK: ORAL_TABLET | ORAL | 0 refills | Status: DC

## 2018-04-15 NOTE — Progress Notes (Signed)
Subjective: Amy [name] is a 72 y.o. female patient who presents to office for evaluation of Right> Left heel/foot pain. Patient complains of progressive pain especially over the last 3 weeks in the Right>Left foot at the Achilles, feels a pulling sensation and numbness on top. Pain is now interferring with daily activities. Patient has tried icy hot with no relief in symptoms. Patient denies any other pedal complaints.   Review of Systems  Musculoskeletal: Positive for joint pain.  All other systems reviewed and are negative.    Patient Active Problem List   Diagnosis Date Noted  . Wound dehiscence 07/22/2016  . Dyspnea 07/21/2016  . Acute encephalopathy 07/21/2016  . Anemia 07/21/2016  . Pancreatitis 07/02/2016  . SBO (small bowel obstruction) ([place]) 07/02/2016  . Long term current use of opiate analgesic 06/26/2016  . Long term prescription opiate use 06/26/2016  . Opiate use 06/26/2016  . Encounter for therapeutic drug level monitoring 06/26/2016  . Encounter for pain management planning 06/26/2016  . Chronic pain 06/26/2016  . Disturbance of skin sensation 06/26/2016  . Chronic low back pain (Location of Primary Source of Pain) (Bilateral) (R>L) 06/26/2016  . Chronic hip pain (Location of Secondary source of pain) (Bilateral) (R>L) 06/26/2016  . 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 Chronic shoulder pain (Location of Tertiary source of pain) (Bilateral) (R>L) 06/26/2016  . 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 Chronic knee pain (Bilateral) (R>L) 06/26/2016  . 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 Chronic neck pain (Right) 06/26/2016  . Lumbar spondylosis 06/26/2016  . Lumbar facet syndrome 06/26/2016  . Lumbar facet hypertrophy 06/26/2016  . 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 Neurogenic pain 06/26/2016  . Occipital headaches 06/26/2016  . 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 History of lumbar fusion 06/26/2016  . 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  . Neuropathic pain of both legs 10/13/2015  . Constipation 10/13/2015  . Complete rotator cuff tear of left shoulder 05/05/2014  . Depression 08/06/2013  . Hypokalemia 08/06/2013  . GERD (gastroesophageal reflux disease) 08/06/2013  . Spinal stenosis of lumbar region 08/06/2013  . Bladder spasm 08/06/2013  . Obesity, Class III, BMI 40-49.9 (morbid obesity) ([place]) 02/03/2013  . Lipoma of buttock s/p excision 02/20/2013 02/03/2013  . DYSPNEA 01/23/2008  . Obstructive sleep apnea 09/12/2007  . HTN (hypertension) 09/12/2007  . Allergic rhinitis 09/12/2007  . SLEEPINESS 09/12/2007  . ANGINA, HX OF 09/12/2007    Current Outpatient Medications on File Prior to Visit  Medication Sig Dispense Refill  . acetaminophen (TYLENOL) 500 MG tablet Take 1,000 mg by mouth 2 (two) times daily as needed for moderate pain.     Marland Kitchen azelaic acid (AZELEX) 20 % cream Apply 1 application topically 2 (two) times daily. After skin is thoroughly washed and patted dry, gently but thoroughly massage a thin film of azelaic acid cream into the affected area twice daily, in the morning and evening.    . calcium-vitamin D (OSCAL WITH D) 500-200 MG-UNIT tablet Take 1 tablet by mouth daily with breakfast.    . cloNIDine (CATAPRES) 0.1 MG tablet Take 1 tablet (0.1 mg total) by mouth 3 (three) times daily. 60 tablet 11  . cyclobenzaprine (FLEXERIL) 10 MG tablet Take 1 tablet (10 mg total) by mouth 3 (three) times daily as needed for muscle spasms. 30 tablet 0  . diphenhydrAMINE (BENADRYL) 25 MG tablet Take 25 mg by mouth 2 (two) times daily as  needed for allergies.    Marland Kitchen estradiol (ESTRACE) 0.1 MG/GM vaginal cream Place 1 Applicatorful vaginally 2 (two) times a week. (Patient taking differently: Place 1 Applicatorful vaginally every 14 (fourteen) days. Tuesday and Thursday) 42.5 g 12  . fluticasone (FLONASE) 50 MCG/ACT nasal spray  Place 1 spray into both nostrils daily. (Patient taking differently: Place 1 spray into both nostrils daily as needed for allergies. )  2  . gabapentin (NEURONTIN) 300 MG capsule Take 300 mg by mouth 2 (two) times daily.    . hydrALAZINE (APRESOLINE) 25 MG tablet Take 25 mg by mouth 3 (three) times daily with meals.     . Menthol-Methyl Salicylate (MUSCLE RUB EX) Apply 1 application topically 2 (two) times daily as needed (pain in neck and back).     . metoprolol (LOPRESSOR) 50 MG tablet Take 1 tablet (50 mg total) by mouth 2 (two) times daily.    . Multiple Vitamins-Minerals (CENTRUM SILVER PO) Take 1 tablet by mouth daily.    Marland Kitchen NIFEdipine (PROCARDIA) 10 MG capsule Take 10 mg by mouth 3 (three) times daily.    . ondansetron (ZOFRAN) 4 MG tablet Take 1 tablet (4 mg total) by mouth every 6 (six) hours as needed for nausea. 20 tablet 0  . OVER THE COUNTER MEDICATION Take 2 tablets by mouth daily. Swiss Kriss OTC herbal laxative    . oxyCODONE-acetaminophen (PERCOCET) 5-325 MG tablet Take 1 tablet by mouth every 4 (four) hours as needed for severe pain. 20 tablet 0  . pantoprazole (PROTONIX) 40 MG tablet Take 40 mg by mouth daily.   0  . PARoxetine (PAXIL) 40 MG tablet Take 40 mg by mouth daily.     . polyethylene glycol (MIRALAX / GLYCOLAX) packet Take 17 g by mouth daily. (Patient taking differently: Take 17 g by mouth daily as needed for mild constipation. ) 14 each 0  . potassium chloride SA (K-DUR,KLOR-CON) 20 MEQ tablet Take 20 mEq by mouth daily.    . Probiotic Product (PROBIOTIC PO) Take 1 capsule by mouth daily.    Marland Kitchen Propylene Glycol (SYSTANE COMPLETE) 0.6 % SOLN Place 1 drop into both eyes 2 (two) times daily as needed (dry eyes).    . tapentadol (NUCYNTA) 50 MG tablet Take 50 mg by mouth every 6 (six) hours as needed for moderate pain.    . traMADol (ULTRAM) 50 MG tablet Take 1 tablet (50 mg total) by mouth every 6 (six) hours as needed. 15 tablet 0  . vitamin B-12 (CYANOCOBALAMIN) 1000 MCG  tablet Take 1,000 mcg by mouth daily.    . vitamin E (VITAMIN E) 400 UNIT capsule Take 400 Units by mouth daily.     No current facility-administered medications on file prior to visit.     Allergies  Allergen Reactions  . Latex Other (See Comments)    Sores, blisters - reaction to gloves  . Penicillins Other (See Comments)    UNSPECIFIED REACTION  "Has taken amoxicillin and ampicillin with no reaction" Has patient had a PCN reaction causing immediate rash, facial/tongue/throat swelling, SOB or lightheadedness with hypotension: Unknown Has patient had a PCN reaction causing severe rash involving mucus membranes or skin necrosis: Unknown Has patient had a PCN reaction that required hospitalization: Unknown Has patient had a PCN reaction occurring within the last 10 years: No   . Tomato Rash    Objective:  General: Alert and oriented x3 in no acute distress  Dermatology: No open lesions bilateral lower extremities, no webspace macerations,  no ecchymosis bilateral, all nails x 10 are well manicured.  Vascular: Dorsalis Pedis and Posterior Tibial pedal pulses 1/4, Capillary Fill Time 3 seconds, + pedal hair growth bilateral, no edema bilateral lower extremities, Temperature gradient within normal limits.  Neurology: Johney Maine sensation intact via light touch bilateral.  Musculoskeletal: Mild tenderness with palpation at insertion of the Achilles on Right>Left and to dorsum of foot, there is calcaneal exostosis with mild soft tissue present and decreased ankle rom with knee extending  vs flexed resembling gastroc equnius bilateral, The achilles tendon feels intact with no nodularity or palpable dell, Thompson sign negative.  Gait: Antalgic gait with increased heel off right>left.   Xrays  Right/Left Foot    Impression: Normal osseous mineralization. Joint spaces preserved except at midfoot where there is arthritis.Previous left bunion surgery. No fracture/dislocation/boney destruction.  Calcaneal spur present. Kager's triangle intact with no obliteration. No soft tissue abnormalities or radiopaque foreign bodies.   Assessment and Plan: Problem List Items Addressed This Visit    None    Visit Diagnoses    Right foot pain    -  Primary   Left foot pain       Relevant Orders   DG Foot Complete Left   Tendonitis       Relevant Medications   methylPREDNISolone (MEDROL DOSEPAK) 4 MG TBPK tablet   Arthritis of foot       Relevant Medications   methylPREDNISolone (MEDROL DOSEPAK) 4 MG TBPK tablet     -Complete examination performed -Xrays reviewed -Discussed treatement options for tendonitis and arthritis  -Dispensed night splint -Rx Medrol  -No improvement will consider MRI/PT/EPAT -Patient to return to office as needed or sooner if condition worsens.  Landis Martins, DPM

## 2018-06-11 IMAGING — MG DIGITAL SCREENING BILATERAL MAMMOGRAM WITH TOMO AND CAD
6 of 10 series · 6 of 30 positions shown · non-contrast
Comparison: Previous exam(s).

CLINICAL DATA: Screening.

EXAM:
DIGITAL SCREENING BILATERAL MAMMOGRAM WITH TOMO AND CAD

[L CC synth-2D (1 of 2)]
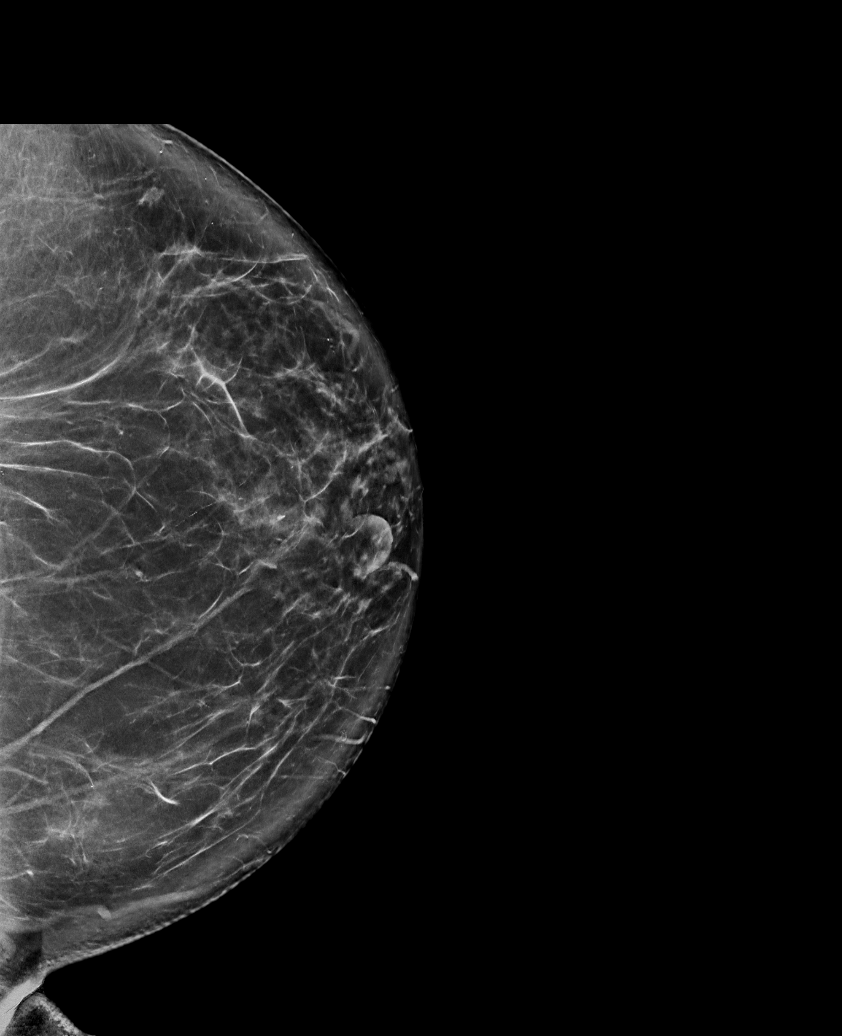

[L MLO synth-2D]
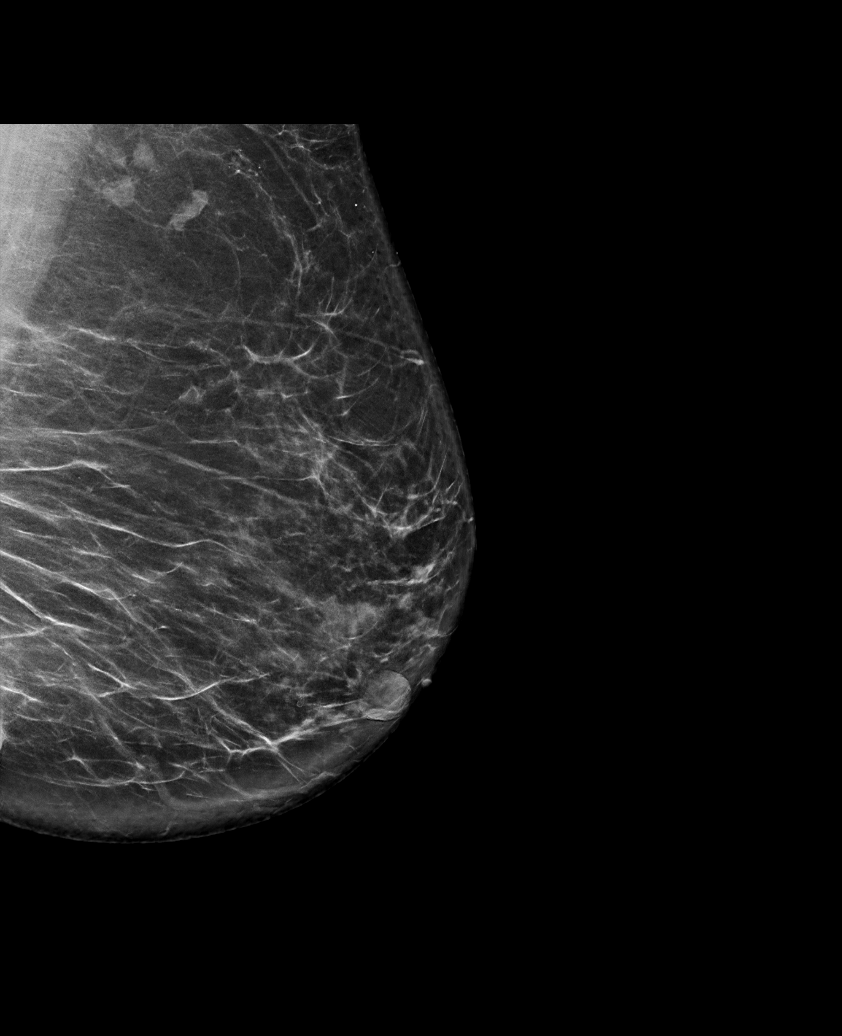

[L CC synth-2D (2 of 2)]
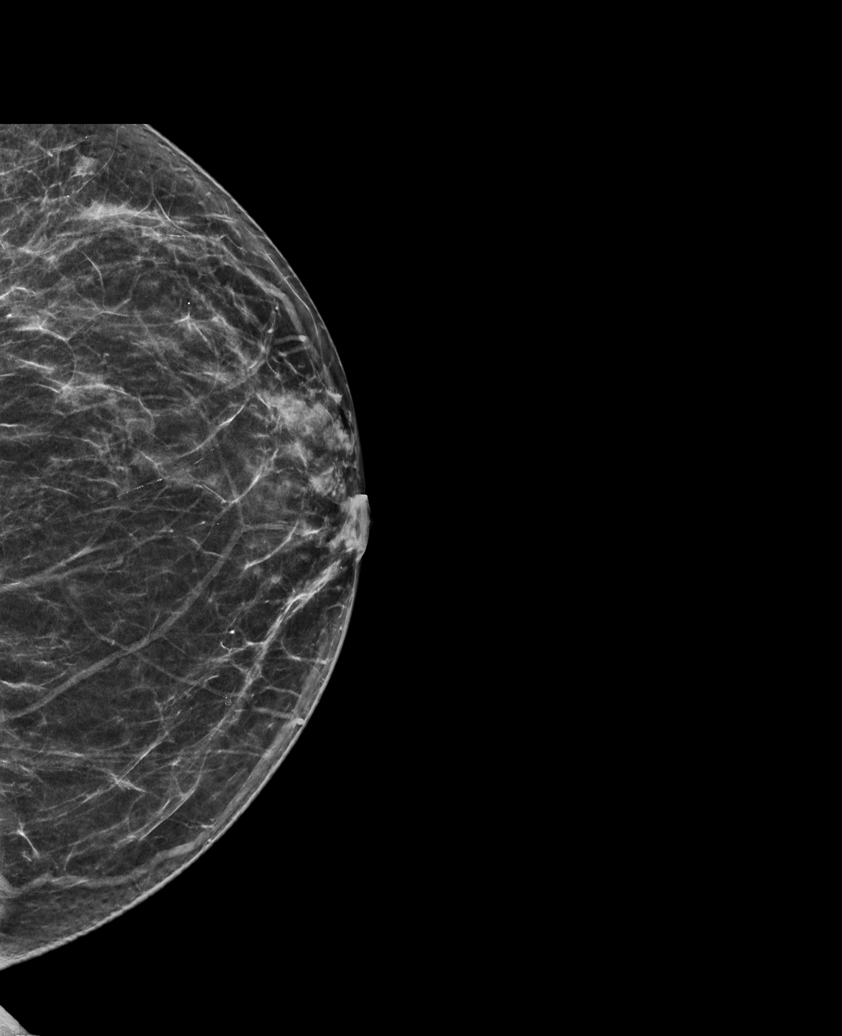

[R CC synth-2D]
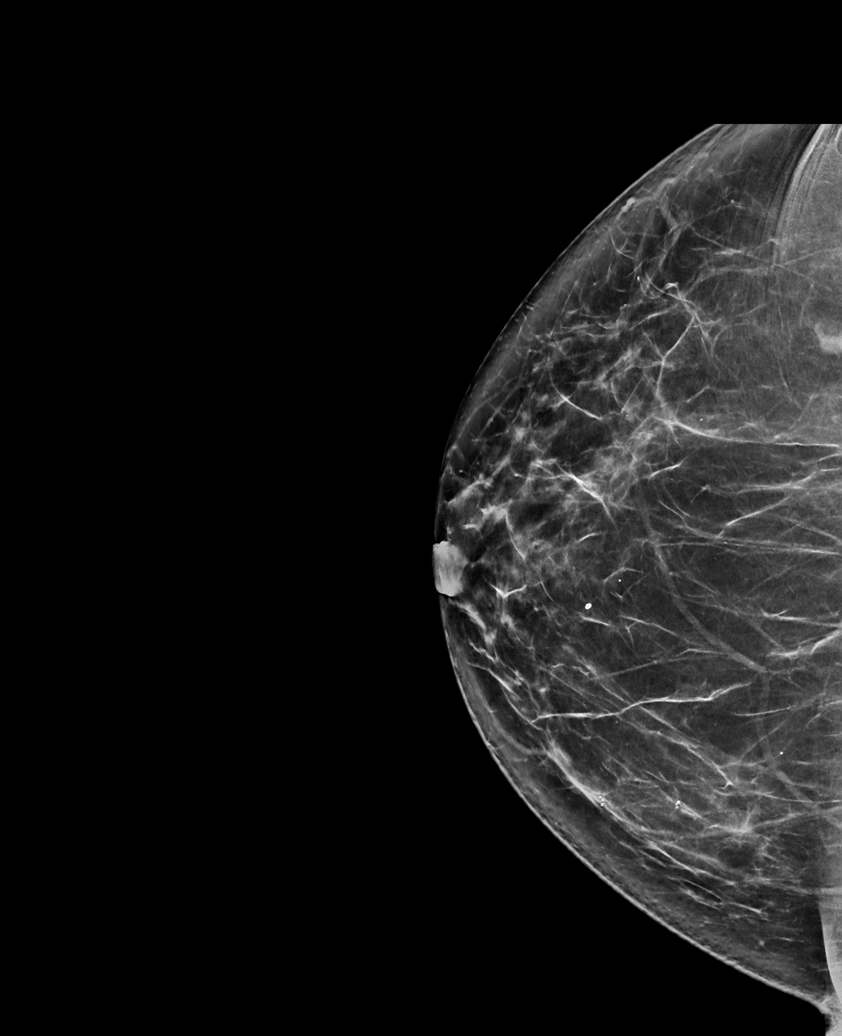

[R MLO synth-2D]
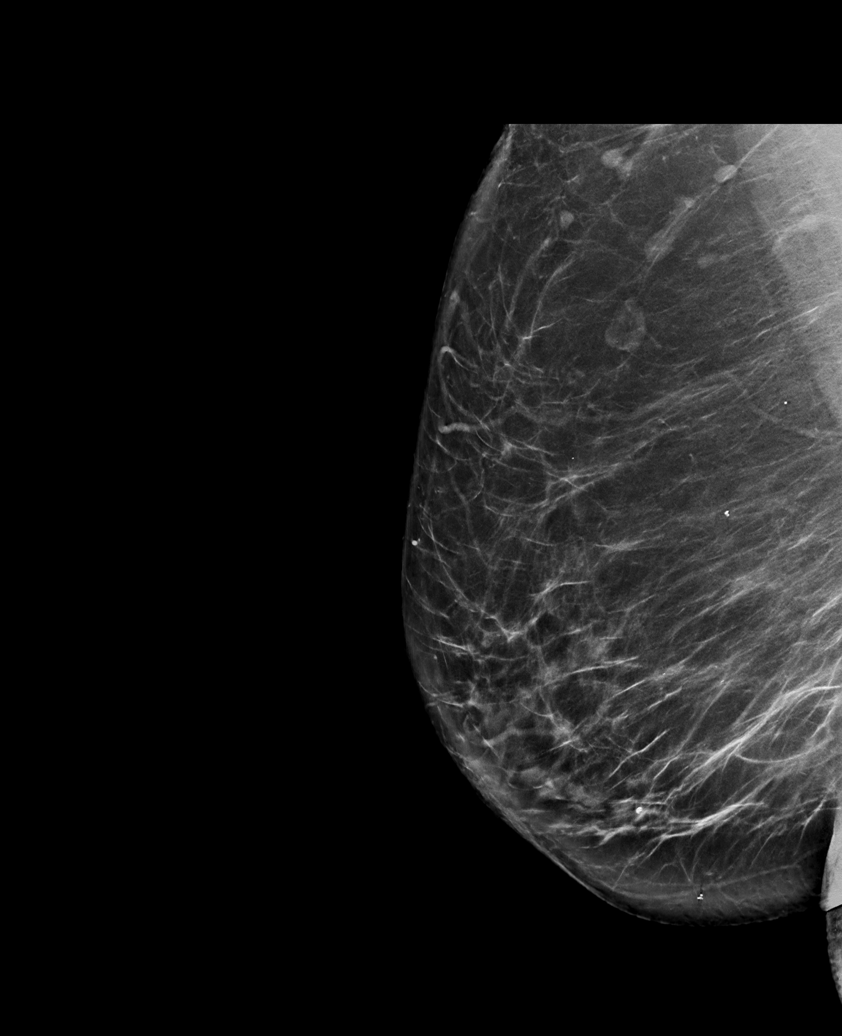

[L MLO tomo · tomo slice 48/95.0]
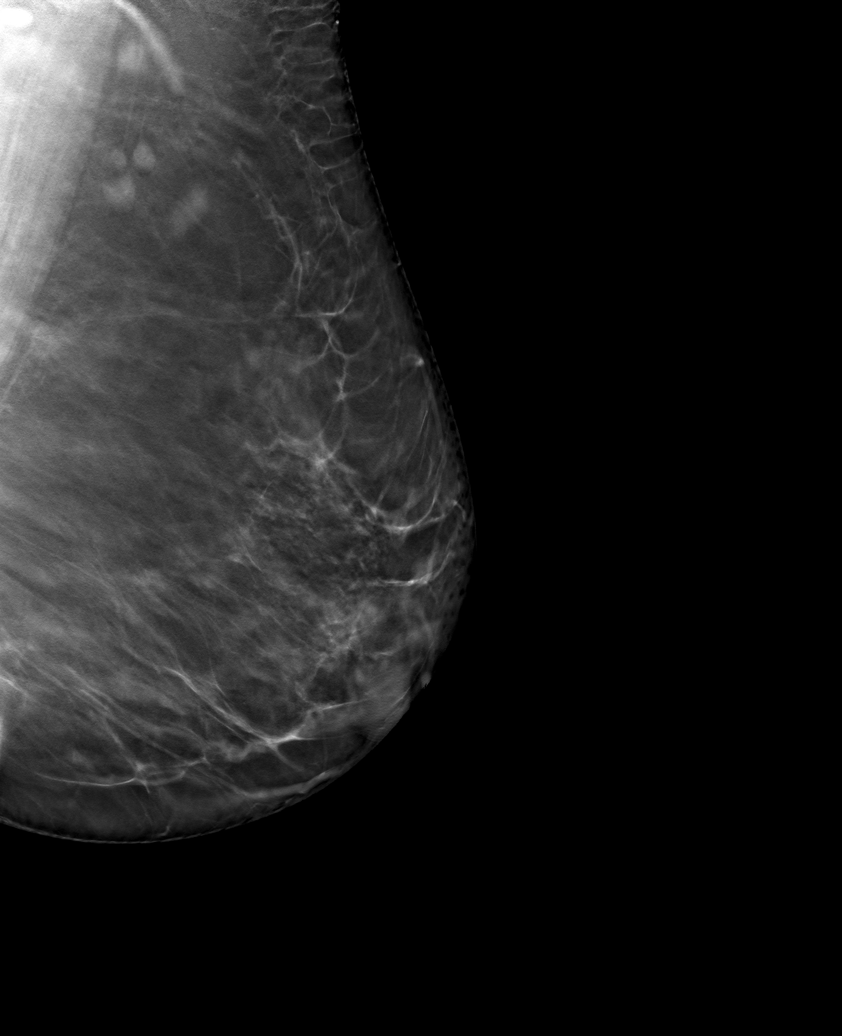

[6 of 30 positions shown; findings below may reference images not displayed]

ACR Breast Density Category b: There are scattered areas of
fibroglandular density.
FINDINGS: There are no findings suspicious for malignancy. Images were
processed with CAD.
IMPRESSION: No mammographic evidence of malignancy. A result letter of this
screening mammogram will be mailed directly to the patient.

RECOMMENDATION:
Screening mammogram in one year. (Code:[id number])

BI-RADS CATEGORY  1: Negative.

## 2018-07-02 DIAGNOSIS — S32009K Unspecified fracture of unspecified lumbar vertebra, subsequent encounter for fracture with nonunion: Secondary | ICD-10-CM | POA: Diagnosis not present

## 2018-07-02 DIAGNOSIS — M5440 Lumbago with sciatica, unspecified side: Secondary | ICD-10-CM | POA: Diagnosis not present

## 2018-07-10 ENCOUNTER — Other Ambulatory Visit: Payer: Self-pay | Admitting: Neurosurgery

## 2018-07-10 ENCOUNTER — Other Ambulatory Visit (HOSPITAL_COMMUNITY): Payer: Self-pay | Admitting: Neurosurgery

## 2018-07-10 DIAGNOSIS — S32009K Unspecified fracture of unspecified lumbar vertebra, subsequent encounter for fracture with nonunion: Secondary | ICD-10-CM

## 2018-07-14 IMAGING — RF DG THORACOLUMBAR SPINE 2V
1 series · 2 of 2 positions shown · non-contrast
Comparison: CT 09/25/2017

CLINICAL DATA: 72-year-old female with a history of revision spinal
fixation.

EXAM:
LUMBAR SPINE - 2-3 VIEW; DG C-ARM 61-120 MIN

[Series 1: run · 2 of 2 slices shown]
[im 1/2]
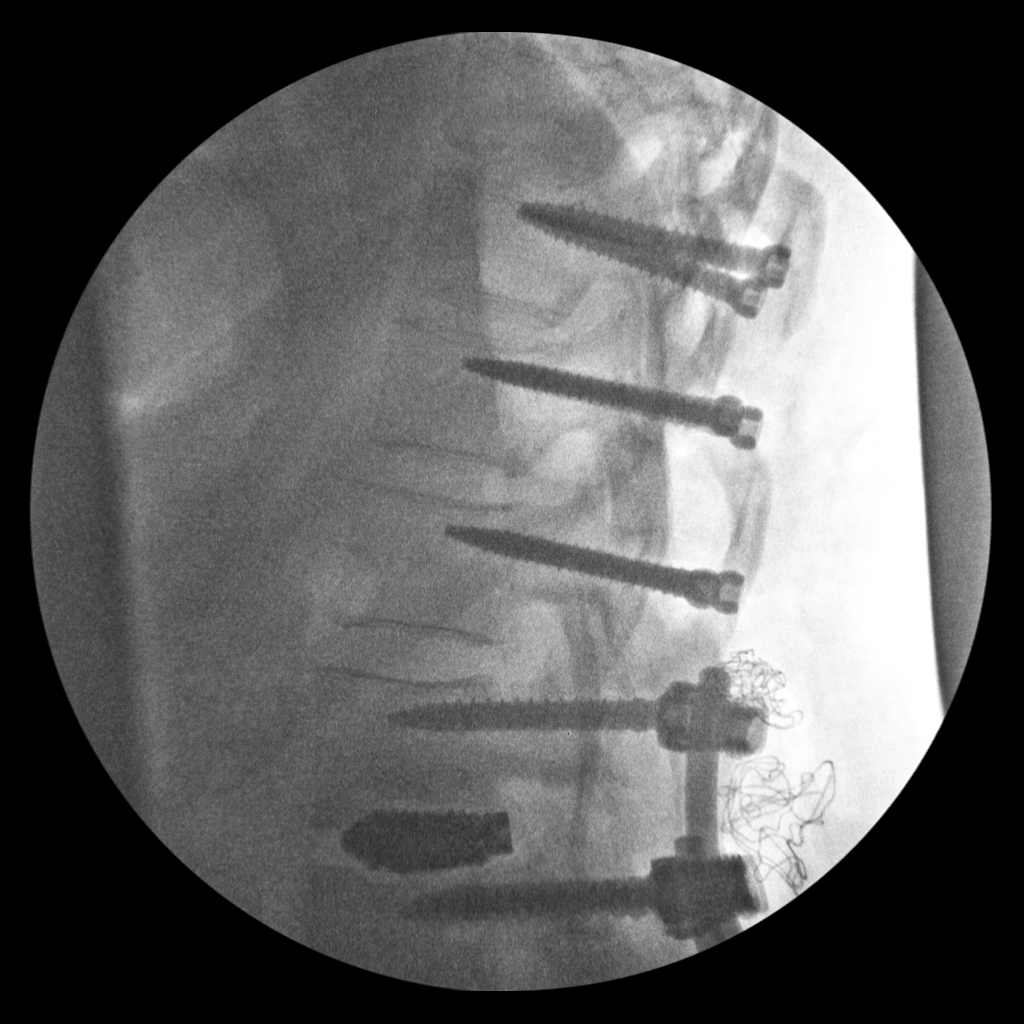
[im 2/2]
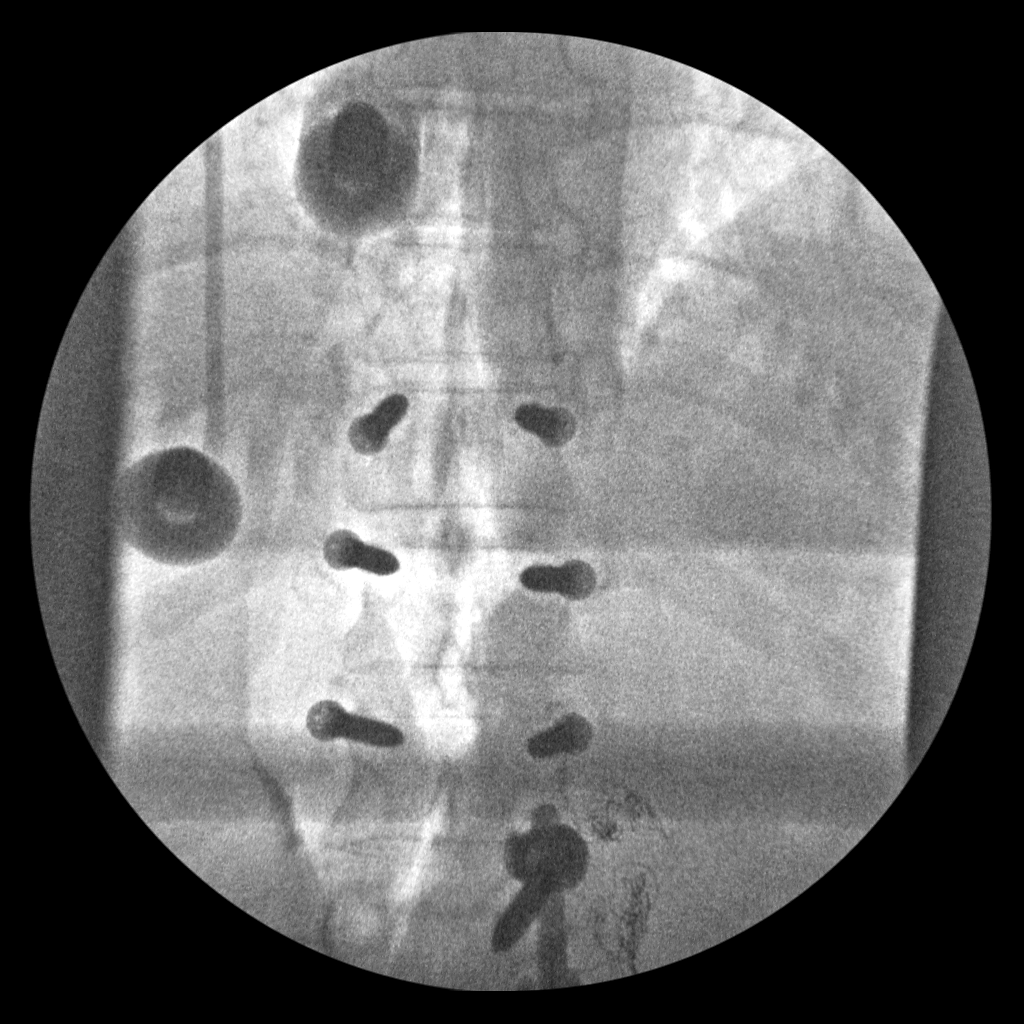

[2 of 2 positions shown; findings below may reference images not displayed]

FINDINGS: Limited intraoperative frontal and lateral view of thoracolumbar
spine, with incompletely imaged hardware.

Presuming the L1-L2 level is the level with a disc spacer and
associated subsidence, as seen on prior CT, there are now
postoperative changes of bilateral pedicle screws at T10, T11, T12.

Surgical sponges project over the posterior elements of L1.
Incompletely imaged previous lumbar fixation.
IMPRESSION: Limited intraoperative fluoroscopic spot images of thoracolumbar
fixation/revision, with interval bilateral pedicle screws at T10,
T11, T12, as above. Please refer to the dictated operative report
for full details of intraoperative findings and procedure.

## 2018-07-15 ENCOUNTER — Ambulatory Visit
Admission: RE | Admit: 2018-07-15 | Discharge: 2018-07-15 | Disposition: A | Discharge status: Home / Self Care | Payer: Medicare Other | Source: Ambulatory Visit | Attending: Neurosurgery | Admitting: Neurosurgery

## 2018-07-15 DIAGNOSIS — S32009K Unspecified fracture of unspecified lumbar vertebra, subsequent encounter for fracture with nonunion: Secondary | ICD-10-CM

## 2018-07-15 DIAGNOSIS — S32009A Unspecified fracture of unspecified lumbar vertebra, initial encounter for closed fracture: Secondary | ICD-10-CM | POA: Diagnosis not present

## 2018-07-22 DIAGNOSIS — R1031 Right lower quadrant pain: Secondary | ICD-10-CM | POA: Insufficient documentation

## 2018-07-22 DIAGNOSIS — M5440 Lumbago with sciatica, unspecified side: Secondary | ICD-10-CM | POA: Diagnosis not present

## 2018-07-30 ENCOUNTER — Other Ambulatory Visit: Payer: Self-pay | Admitting: Gastroenterology

## 2018-07-30 DIAGNOSIS — I10 Essential (primary) hypertension: Secondary | ICD-10-CM | POA: Diagnosis not present

## 2018-07-30 DIAGNOSIS — Z8719 Personal history of other diseases of the digestive system: Secondary | ICD-10-CM

## 2018-07-30 DIAGNOSIS — M5441 Lumbago with sciatica, right side: Secondary | ICD-10-CM | POA: Diagnosis not present

## 2018-07-30 DIAGNOSIS — M48061 Spinal stenosis, lumbar region without neurogenic claudication: Secondary | ICD-10-CM | POA: Diagnosis not present

## 2018-07-30 DIAGNOSIS — R1084 Generalized abdominal pain: Secondary | ICD-10-CM

## 2018-07-30 DIAGNOSIS — G8929 Other chronic pain: Secondary | ICD-10-CM | POA: Diagnosis not present

## 2018-07-30 DIAGNOSIS — K224 Dyskinesia of esophagus: Secondary | ICD-10-CM | POA: Diagnosis not present

## 2018-07-30 DIAGNOSIS — K219 Gastro-esophageal reflux disease without esophagitis: Secondary | ICD-10-CM | POA: Diagnosis not present

## 2018-07-30 DIAGNOSIS — M797 Fibromyalgia: Secondary | ICD-10-CM | POA: Diagnosis not present

## 2018-08-01 ENCOUNTER — Ambulatory Visit
Admission: RE | Admit: 2018-08-01 | Discharge: 2018-08-01 | Disposition: A | Discharge status: Home / Self Care | Payer: Medicare Other | Source: Ambulatory Visit | Attending: Gastroenterology | Admitting: Gastroenterology

## 2018-08-01 DIAGNOSIS — Z8719 Personal history of other diseases of the digestive system: Secondary | ICD-10-CM

## 2018-08-01 DIAGNOSIS — R1084 Generalized abdominal pain: Secondary | ICD-10-CM

## 2018-08-01 DIAGNOSIS — K56609 Unspecified intestinal obstruction, unspecified as to partial versus complete obstruction: Secondary | ICD-10-CM | POA: Diagnosis not present

## 2018-08-01 MED ORDER — IOPAMIDOL (ISOVUE-300) INJECTION 61%
100.0000 mL | Freq: Once | INTRAVENOUS | Status: AC | PRN
  Administered 2018-08-01: 100 mL via INTRAVENOUS

## 2018-08-06 DIAGNOSIS — M5441 Lumbago with sciatica, right side: Secondary | ICD-10-CM | POA: Diagnosis not present

## 2018-08-06 DIAGNOSIS — M797 Fibromyalgia: Secondary | ICD-10-CM | POA: Diagnosis not present

## 2018-08-06 DIAGNOSIS — K219 Gastro-esophageal reflux disease without esophagitis: Secondary | ICD-10-CM | POA: Diagnosis not present

## 2018-08-06 DIAGNOSIS — Z8719 Personal history of other diseases of the digestive system: Secondary | ICD-10-CM | POA: Diagnosis not present

## 2018-08-06 DIAGNOSIS — K224 Dyskinesia of esophagus: Secondary | ICD-10-CM | POA: Diagnosis not present

## 2018-08-06 DIAGNOSIS — R1084 Generalized abdominal pain: Secondary | ICD-10-CM | POA: Diagnosis not present

## 2018-08-11 DIAGNOSIS — Z23 Encounter for immunization: Secondary | ICD-10-CM | POA: Diagnosis not present

## 2018-08-11 DIAGNOSIS — I10 Essential (primary) hypertension: Secondary | ICD-10-CM | POA: Diagnosis not present

## 2018-08-11 DIAGNOSIS — F332 Major depressive disorder, recurrent severe without psychotic features: Secondary | ICD-10-CM | POA: Diagnosis not present

## 2018-08-11 DIAGNOSIS — M545 Low back pain: Secondary | ICD-10-CM | POA: Diagnosis not present

## 2018-08-11 DIAGNOSIS — E669 Obesity, unspecified: Secondary | ICD-10-CM | POA: Diagnosis not present

## 2018-08-11 DIAGNOSIS — M797 Fibromyalgia: Secondary | ICD-10-CM | POA: Diagnosis not present

## 2018-08-11 DIAGNOSIS — E7800 Pure hypercholesterolemia, unspecified: Secondary | ICD-10-CM | POA: Diagnosis not present

## 2018-08-27 DIAGNOSIS — Z79891 Long term (current) use of opiate analgesic: Secondary | ICD-10-CM | POA: Diagnosis not present

## 2018-08-27 DIAGNOSIS — Z6841 Body Mass Index (BMI) 40.0 and over, adult: Secondary | ICD-10-CM | POA: Diagnosis not present

## 2018-08-27 DIAGNOSIS — I10 Essential (primary) hypertension: Secondary | ICD-10-CM | POA: Diagnosis not present

## 2018-08-27 DIAGNOSIS — M5440 Lumbago with sciatica, unspecified side: Secondary | ICD-10-CM | POA: Diagnosis not present

## 2018-09-01 IMAGING — MR MR CERVICAL SPINE W/O CM
4 of 5 series · 24 of 48 positions shown · non-contrast
Comparison: [HOSPITAL] Cervical spine
radiographs 06/27/2016.

CLINICAL DATA: 72-year-old female with pain when turning the head
right to left. Left shoulder and bilateral hand pain.

EXAM:
MRI CERVICAL SPINE WITHOUT CONTRAST
TECHNIQUE: Multiplanar, multisequence MR imaging of the cervical spine was
performed. No intravenous contrast was administered.

[Series 3: T2 post-contrast · sagittal · 3.3mm · 0.37mm/px · 6 of 13 slices shown]
[im 1/13]
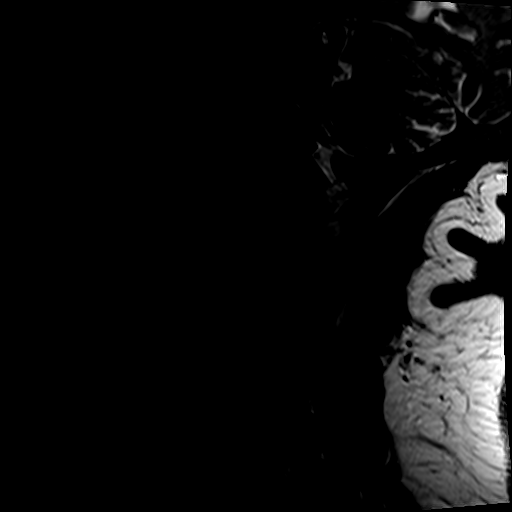
[im 3/13]
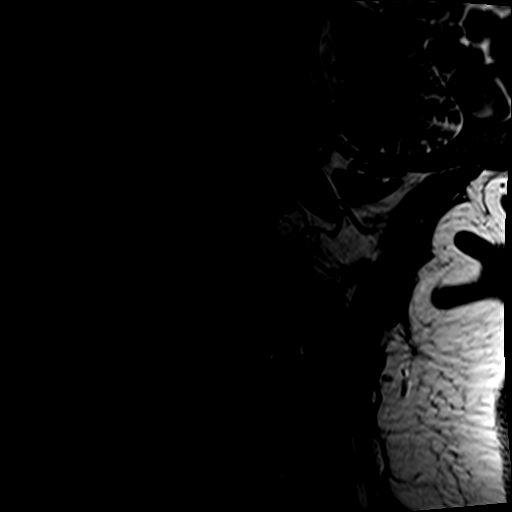
[im 5/13]
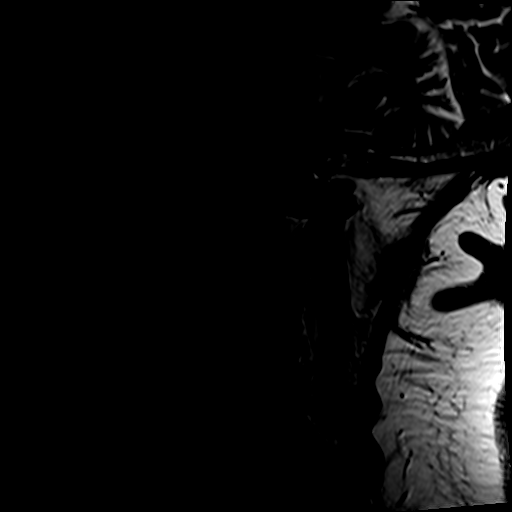
[im 8/13]
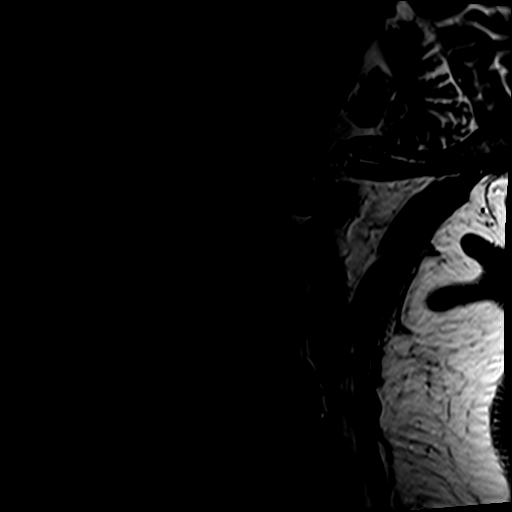
[im 10/13]
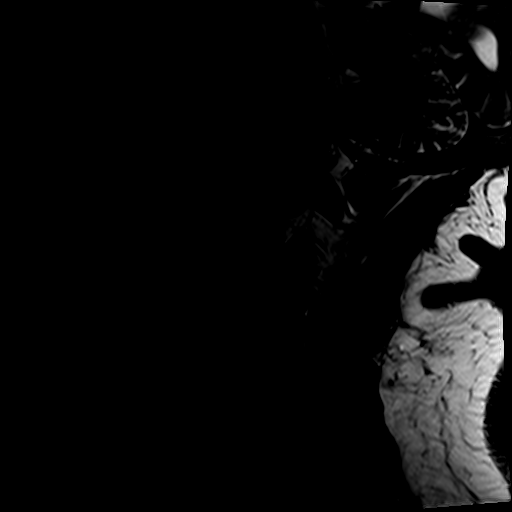
[im 13/13]
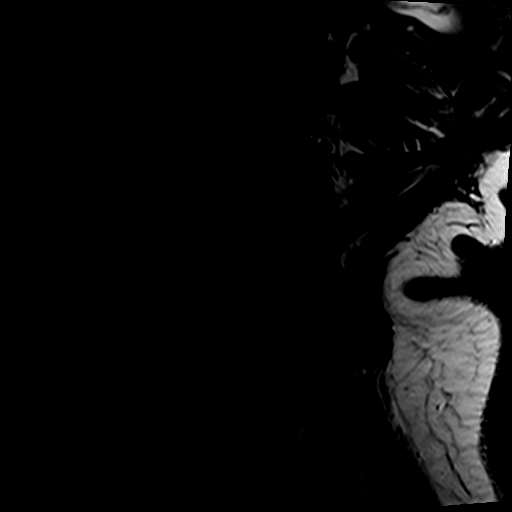

[Series 4: T1 · sagittal · 3.3mm · 0.37mm/px · 7 of 13 slices shown]
[im 1/13]
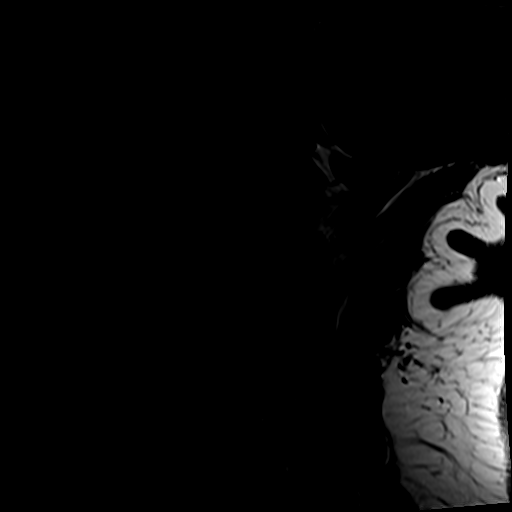
[im 3/13]
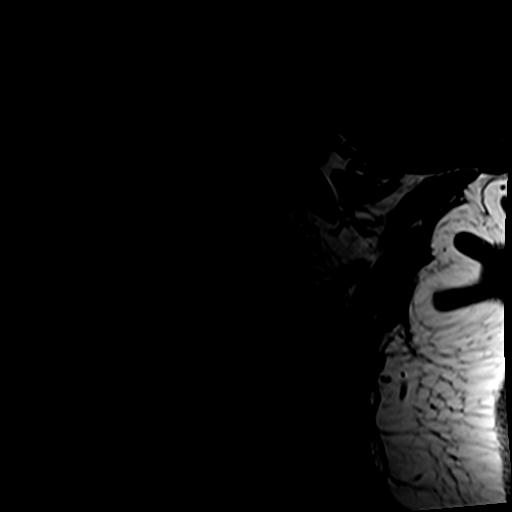
[im 5/13]
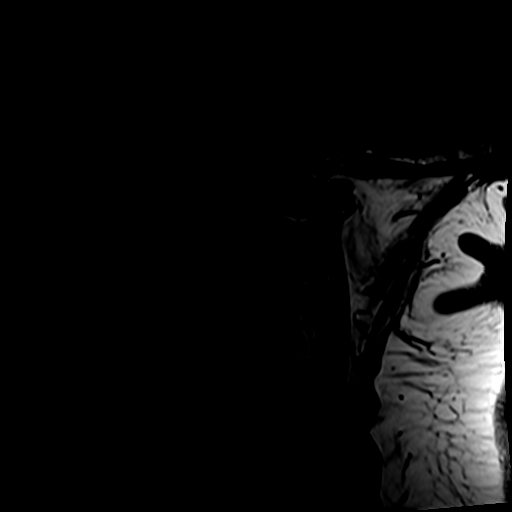
[im 7/13]
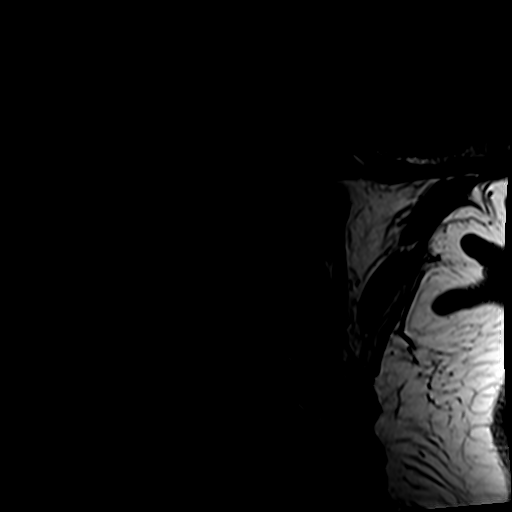
[im 9/13]
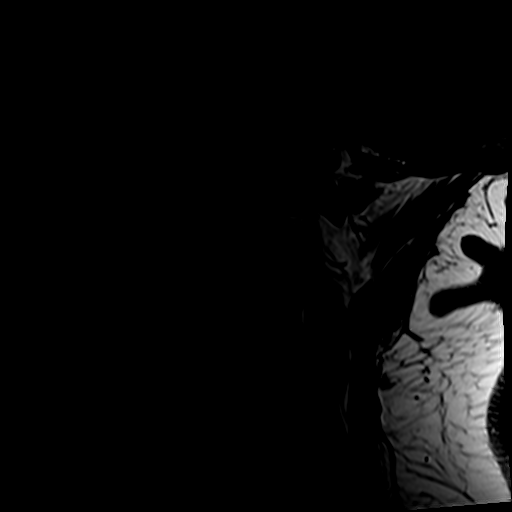
[im 11/13]
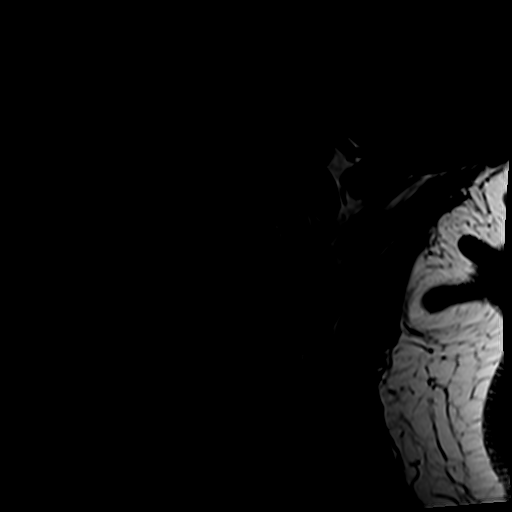
[im 13/13]
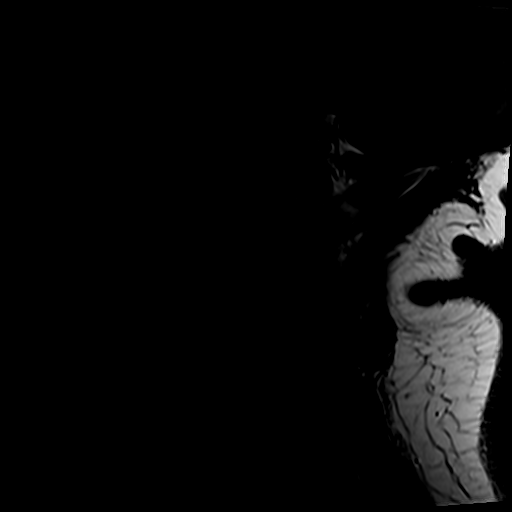

[Series 5: tir sag · sagittal · 3.3mm · 0.37mm/px · 3 of 13 slices shown]
[im 3/13]
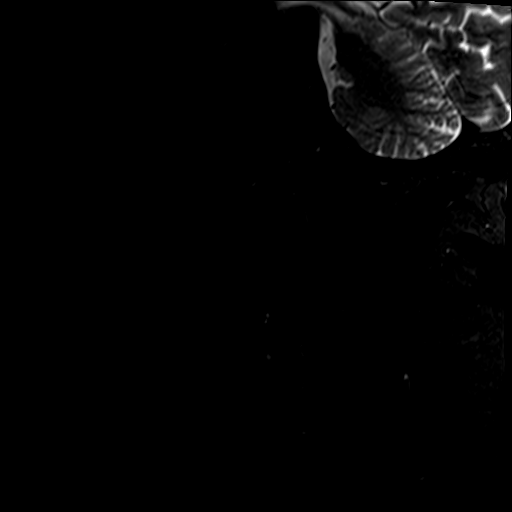
[im 7/13]
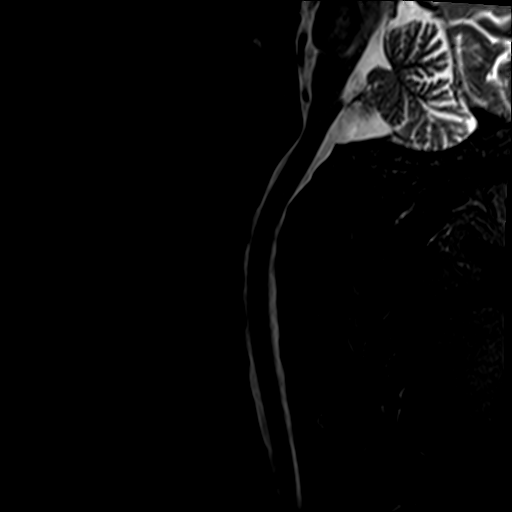
[im 11/13]
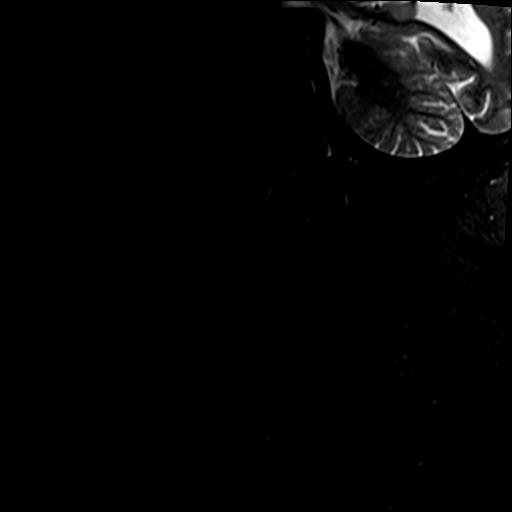

[Series 7: T2 · axial · 3.0mm · 0.70mm/px · z∈[-119,-28]mm · 8 of 26 slices shown]
[im 1/26]
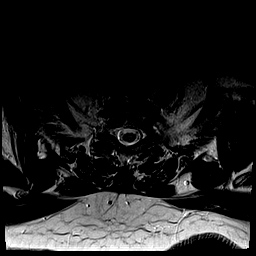
[im 4/26]
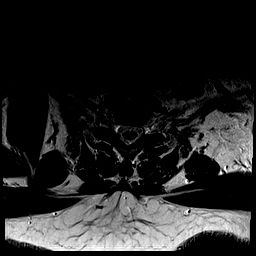
[im 8/26]
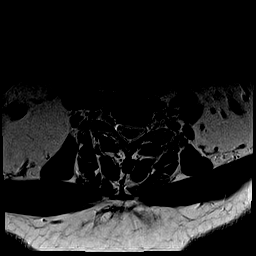
[im 12/26]
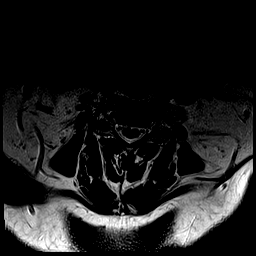
[im 14/26]
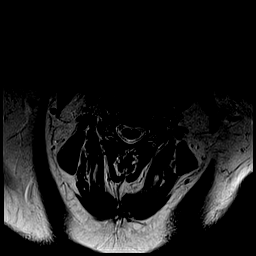
[im 18/26]
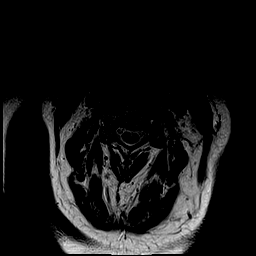
[im 22/26]
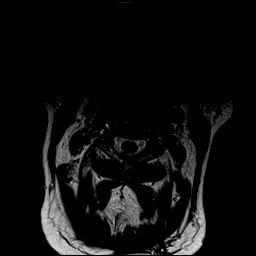
[im 26/26]
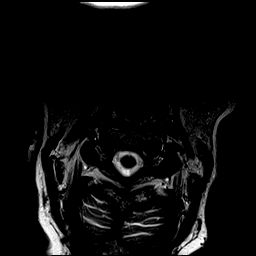

[24 of 48 positions shown; findings below may reference images not displayed]

FINDINGS: Alignment: Preserved cervical lordosis.  No spondylolisthesis.

Vertebrae: No marrow edema or evidence of acute osseous abnormality.
Visualized bone marrow signal is within normal limits.

Cord: Spinal cord signal is within normal limits at all visualized
levels.

Posterior Fossa, vertebral arteries, paraspinal tissues: Patchy
nonspecific T2 hyperintensity partially visible in the pons.
Otherwise negative visible posterior fossa. Preserved major vascular
flow voids in the neck. Large body habitus, but otherwise negative
neck soft tissues and visible lung apices.

Disc levels:

C2-C3:  Negative.

C3-C4: Mild facet hypertrophy. Small central to slightly left
paracentral posterior disc protrusion or bulge. Borderline to mild
spinal stenosis. No convincing foraminal stenosis.

C4-C5:  Negative.

C5-C6: Small central posterior disc protrusion (series 6, image 16).
No stenosis.

C6-C7: Minimal posterior central to left paracentral disc bulge or
protrusion. No stenosis.

C7-T1:  Mild facet hypertrophy.  No stenosis.

Grossly negative visible upper thoracic levels.
IMPRESSION: 1. Largely unremarkable for age MRI appearance of the cervical
spine. There is borderline to mild spinal stenosis at C3-C4 related
to mild left paracentral posterior disc bulging or protrusion.
2. Patchy T2 hyperintensity in the visible pons, most commonly due
to chronic small vessel disease.

## 2018-09-06 IMAGING — CT CT ANGIO CHEST
2 of 7 series · 18 of 46 positions shown · IV contrast (APPLIED)
Comparison: Plain films of earlier in the day.  CT 07/21/2016.

CLINICAL DATA: Chest pain beginning yesterday. Shortness of breath
and cough.

EXAM:
CT ANGIOGRAPHY CHEST WITH CONTRAST
TECHNIQUE: Multidetector CT imaging of the chest was performed using the
standard protocol during bolus administration of intravenous
contrast. Multiplanar CT image reconstructions and MIPs were
obtained to evaluate the vascular anatomy.
CONTRAST:  80mL [id number] IOPAMIDOL ([id number]) INJECTION 76%

[Series 7: thins · axial · 0.66mm/px · z∈[+1083,+1319]mm · 15 of 381 slices shown]
[im 22/381  lung]
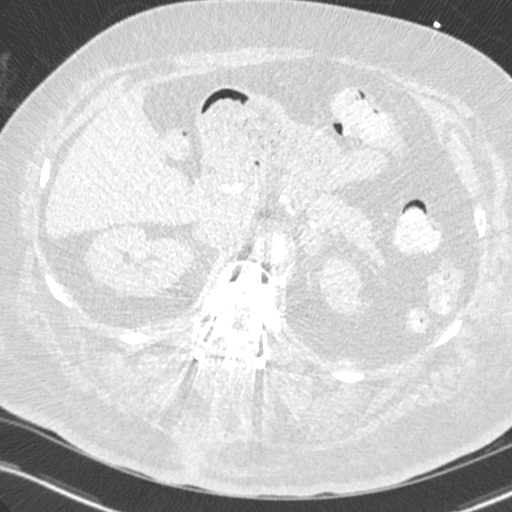
[im 43/381  soft-tissue]
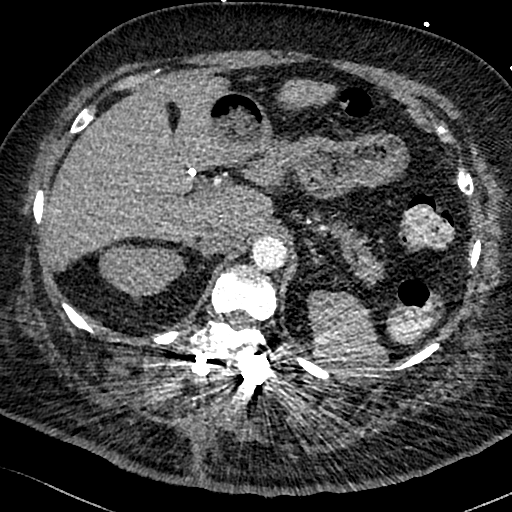
[im 64/381  lung]
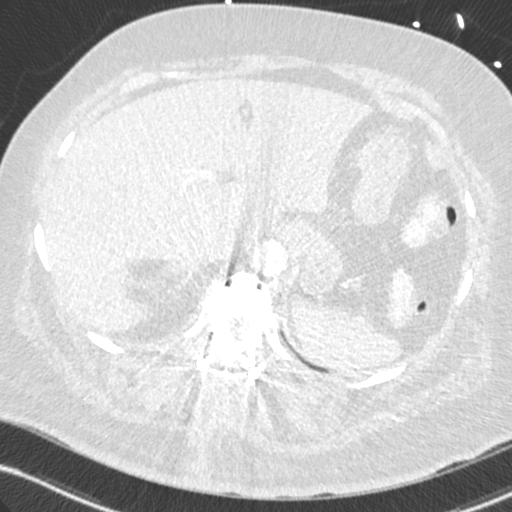
[im 85/381  soft-tissue]
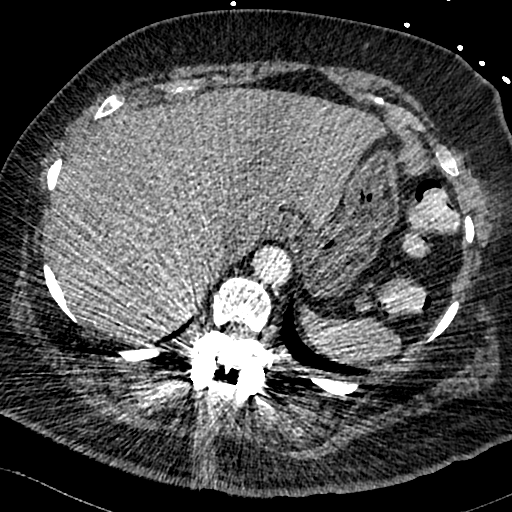
[im 127/381  lung]
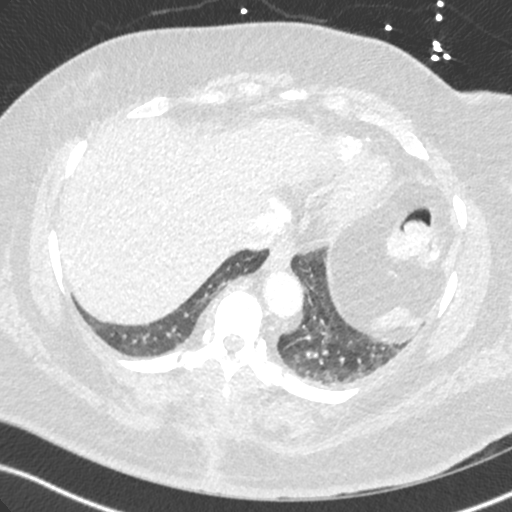
[im 148/381  soft-tissue]
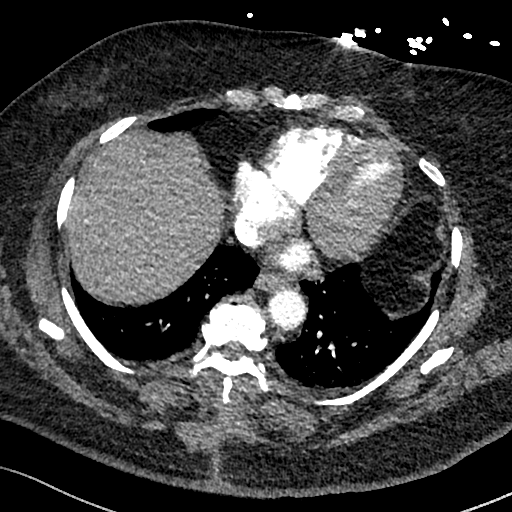
[im 169/381  lung]
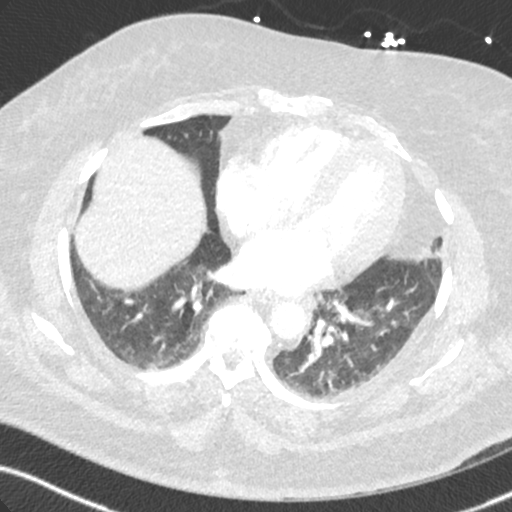
[im 191/381  soft-tissue]
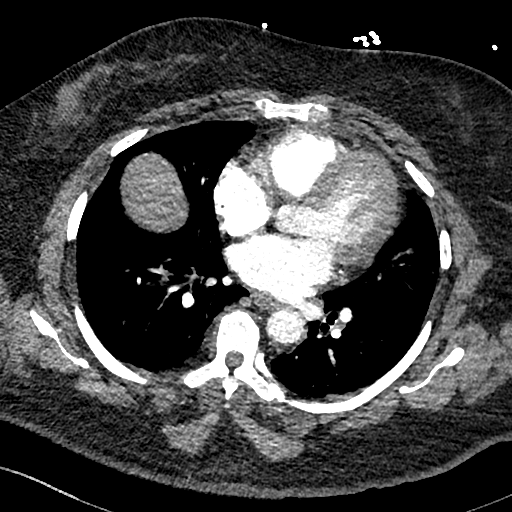
[im 212/381  lung]
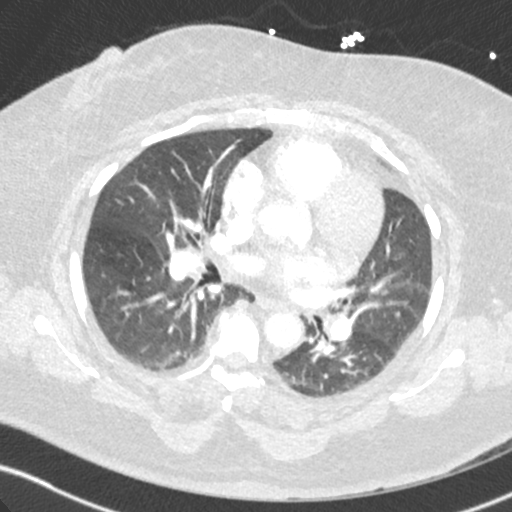
[im 233/381  soft-tissue]
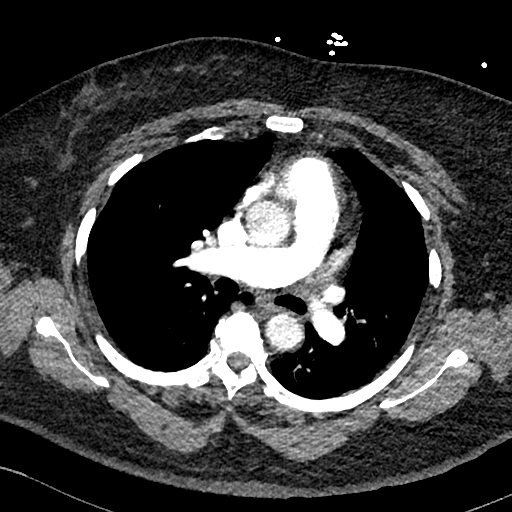
[im 254/381  lung]
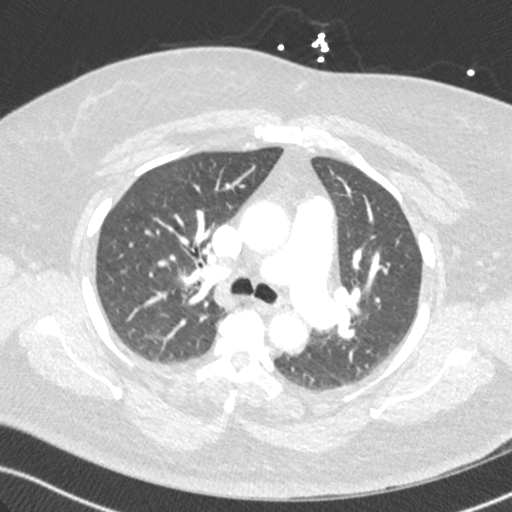
[im 296/381  soft-tissue]
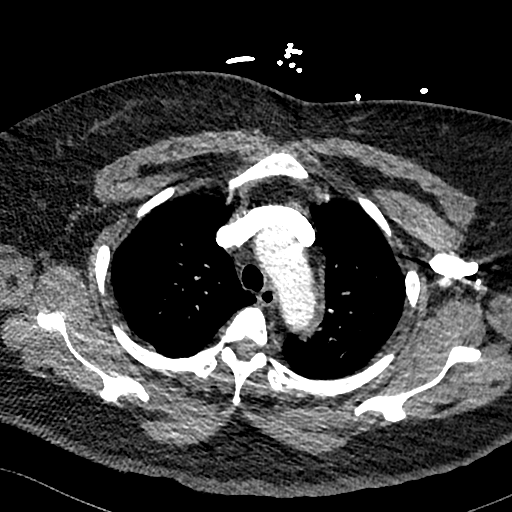
[im 317/381  lung]
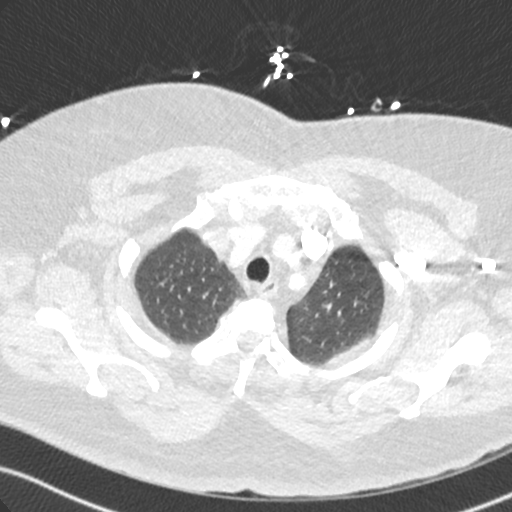
[im 338/381  soft-tissue]
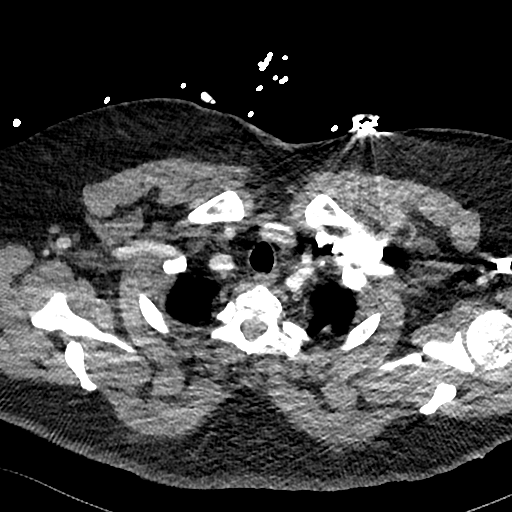
[im 359/381  lung]
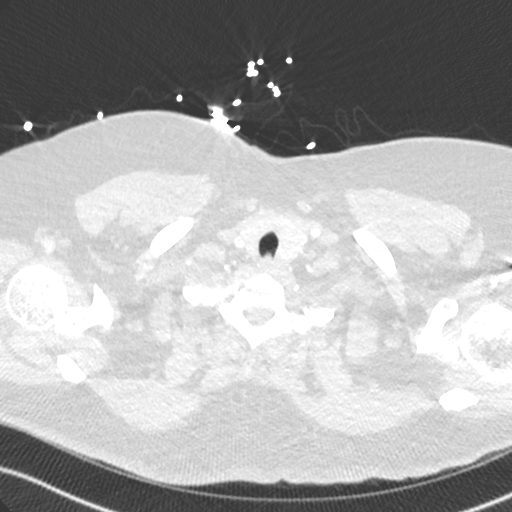

[Series 8: cor · coronal · 0.55mm/px · 3 of 122 slices shown]
[im 31/122  soft-tissue]
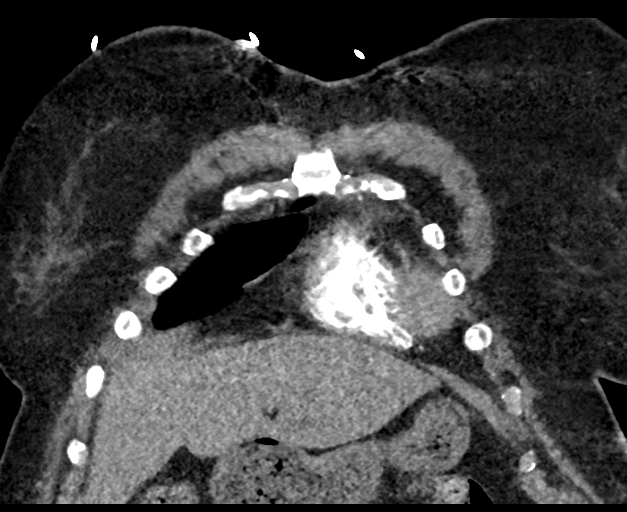
[im 61/122  soft-tissue]
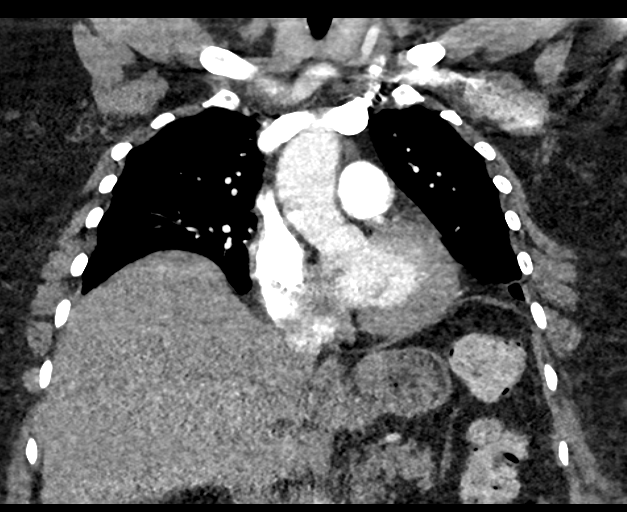
[im 91/122  soft-tissue]
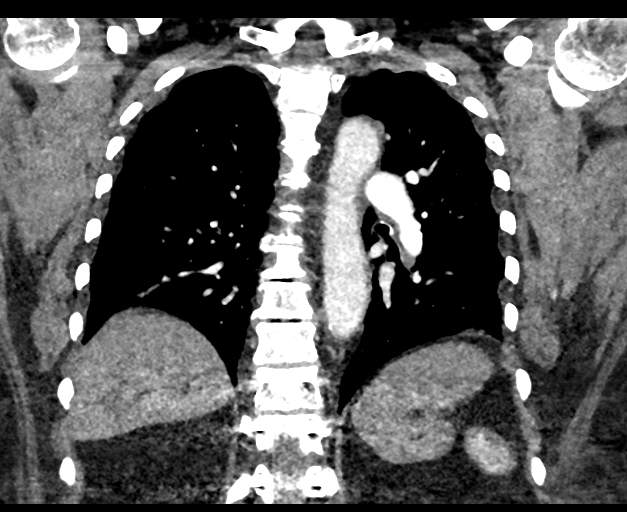

[18 of 46 positions shown; findings below may reference images not displayed]

FINDINGS: Cardiovascular: The quality of this exam for evaluation of pulmonary
embolism is good. Mild multifactorial degradation as detailed below.
No evidence of pulmonary embolism. Aortic atherosclerosis. Tortuous
thoracic aorta. Mild cardiomegaly, without pericardial effusion. Lad
coronary artery atherosclerosis.

Mediastinum/Nodes: No mediastinal or hilar adenopathy.

Lungs/Pleura: No pleural fluid. Extensive degradation from spinal
fixation hardware and resultant beam hardening artifact.

Exam also degraded by patient body habitus and minimal motion
degradation.

Improvement in lingular opacity most consistent with atelectasis.

No lobar consolidation.

Upper Abdomen: Cholecystectomy. Normal imaged portions of the liver,
spleen, stomach, pancreas, kidneys, right adrenal gland. Left
adrenal nodularity is similar to 07/21/2016, likely due to an
adenoma.

Musculoskeletal: Thoracolumbar spine fixation. Midthoracic
spondylosis.

Review of the MIP images confirms the above findings.
IMPRESSION: 1. Mild degradation as detailed above. No pulmonary embolism given
this limitation.
2.  No acute process in the chest.
3. Coronary artery atherosclerosis. Aortic Atherosclerosis
([id number]).

## 2018-09-06 IMAGING — CR DG CHEST 2V
2 series · 2 of 2 positions shown · non-contrast
Comparison: 10/21/2017

CLINICAL DATA: Per patient Chest pain upper right side x 24 hours.
"Feels like an elephant's foot on my chest". Hx HTN

EXAM:
CHEST - 2 VIEW

[chest pa]
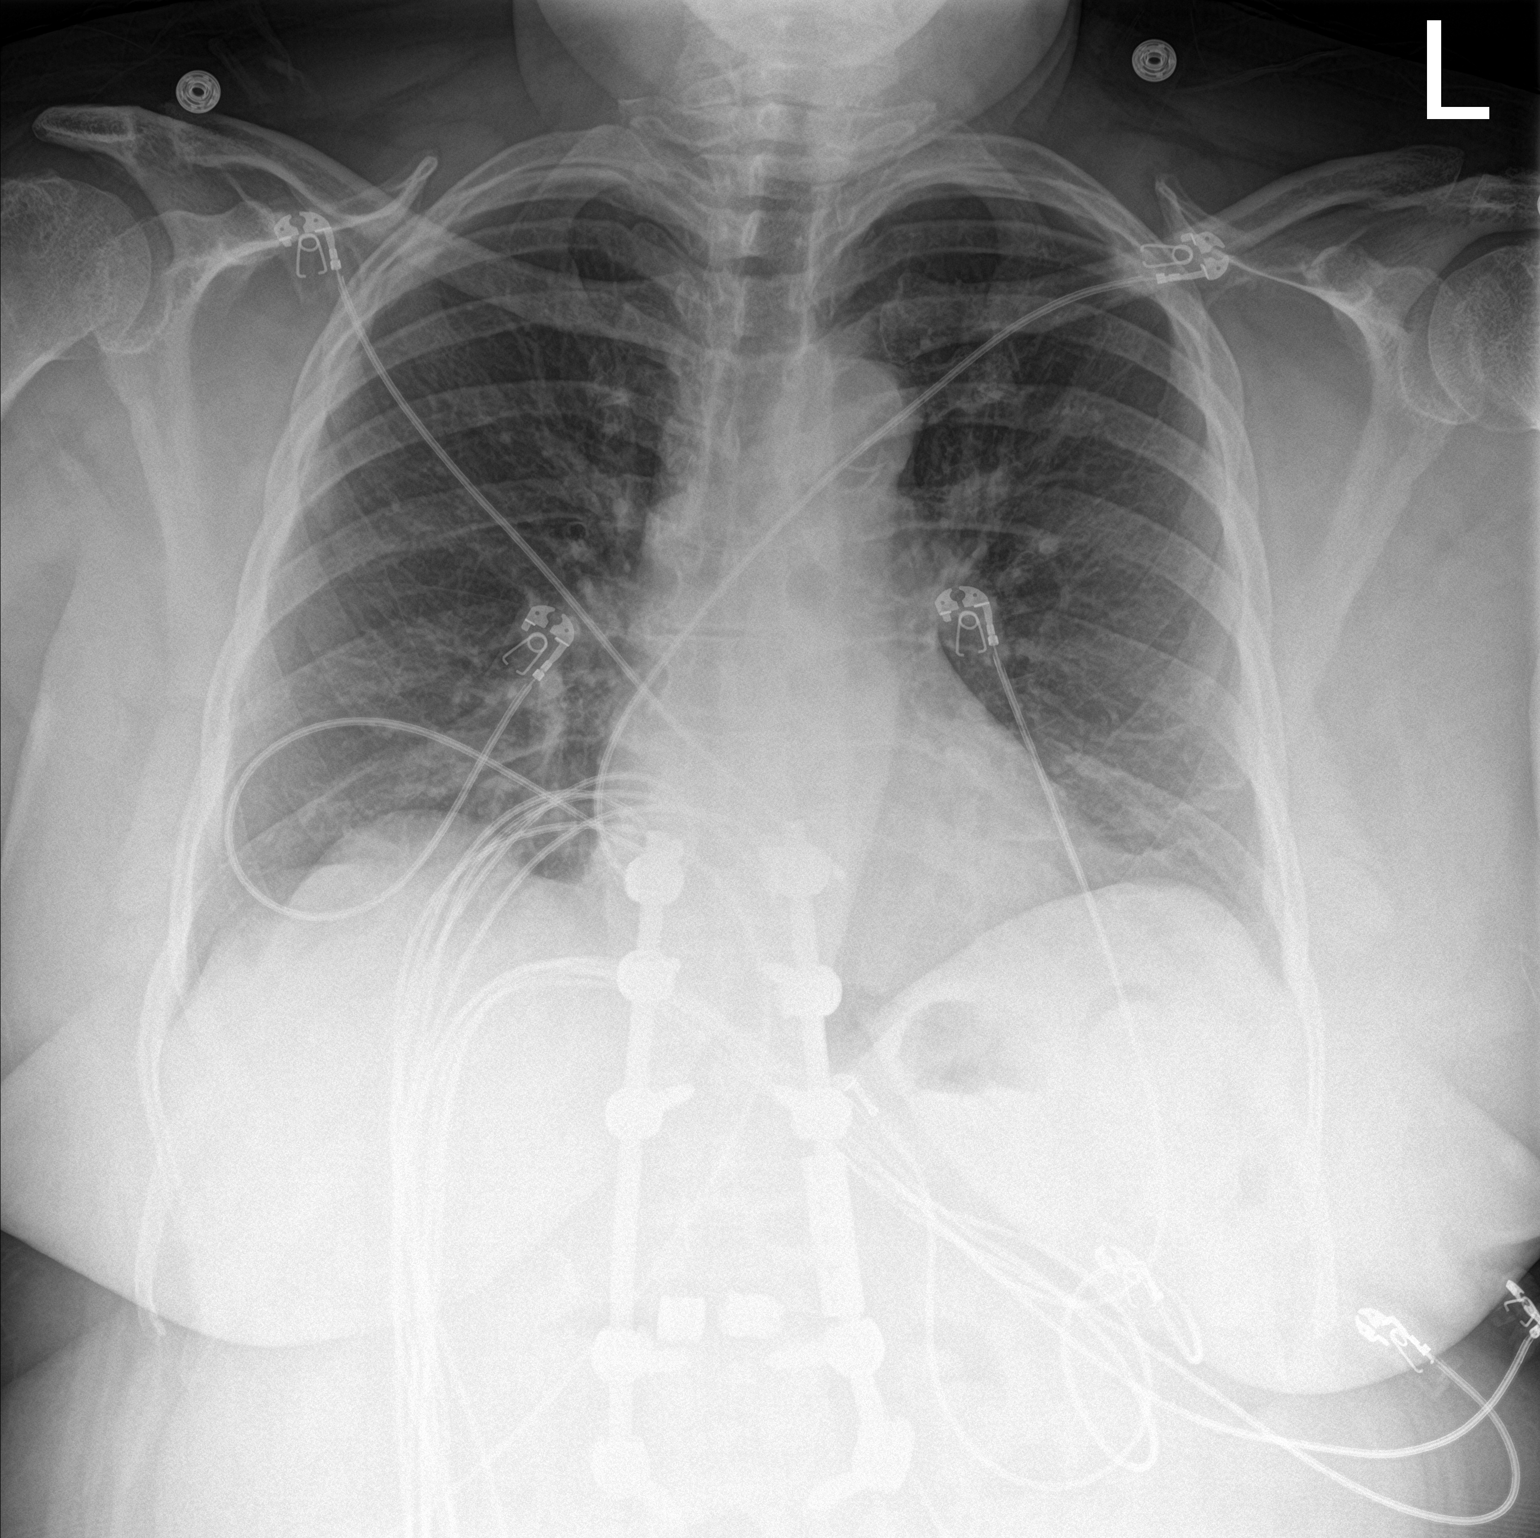

[chest lat]
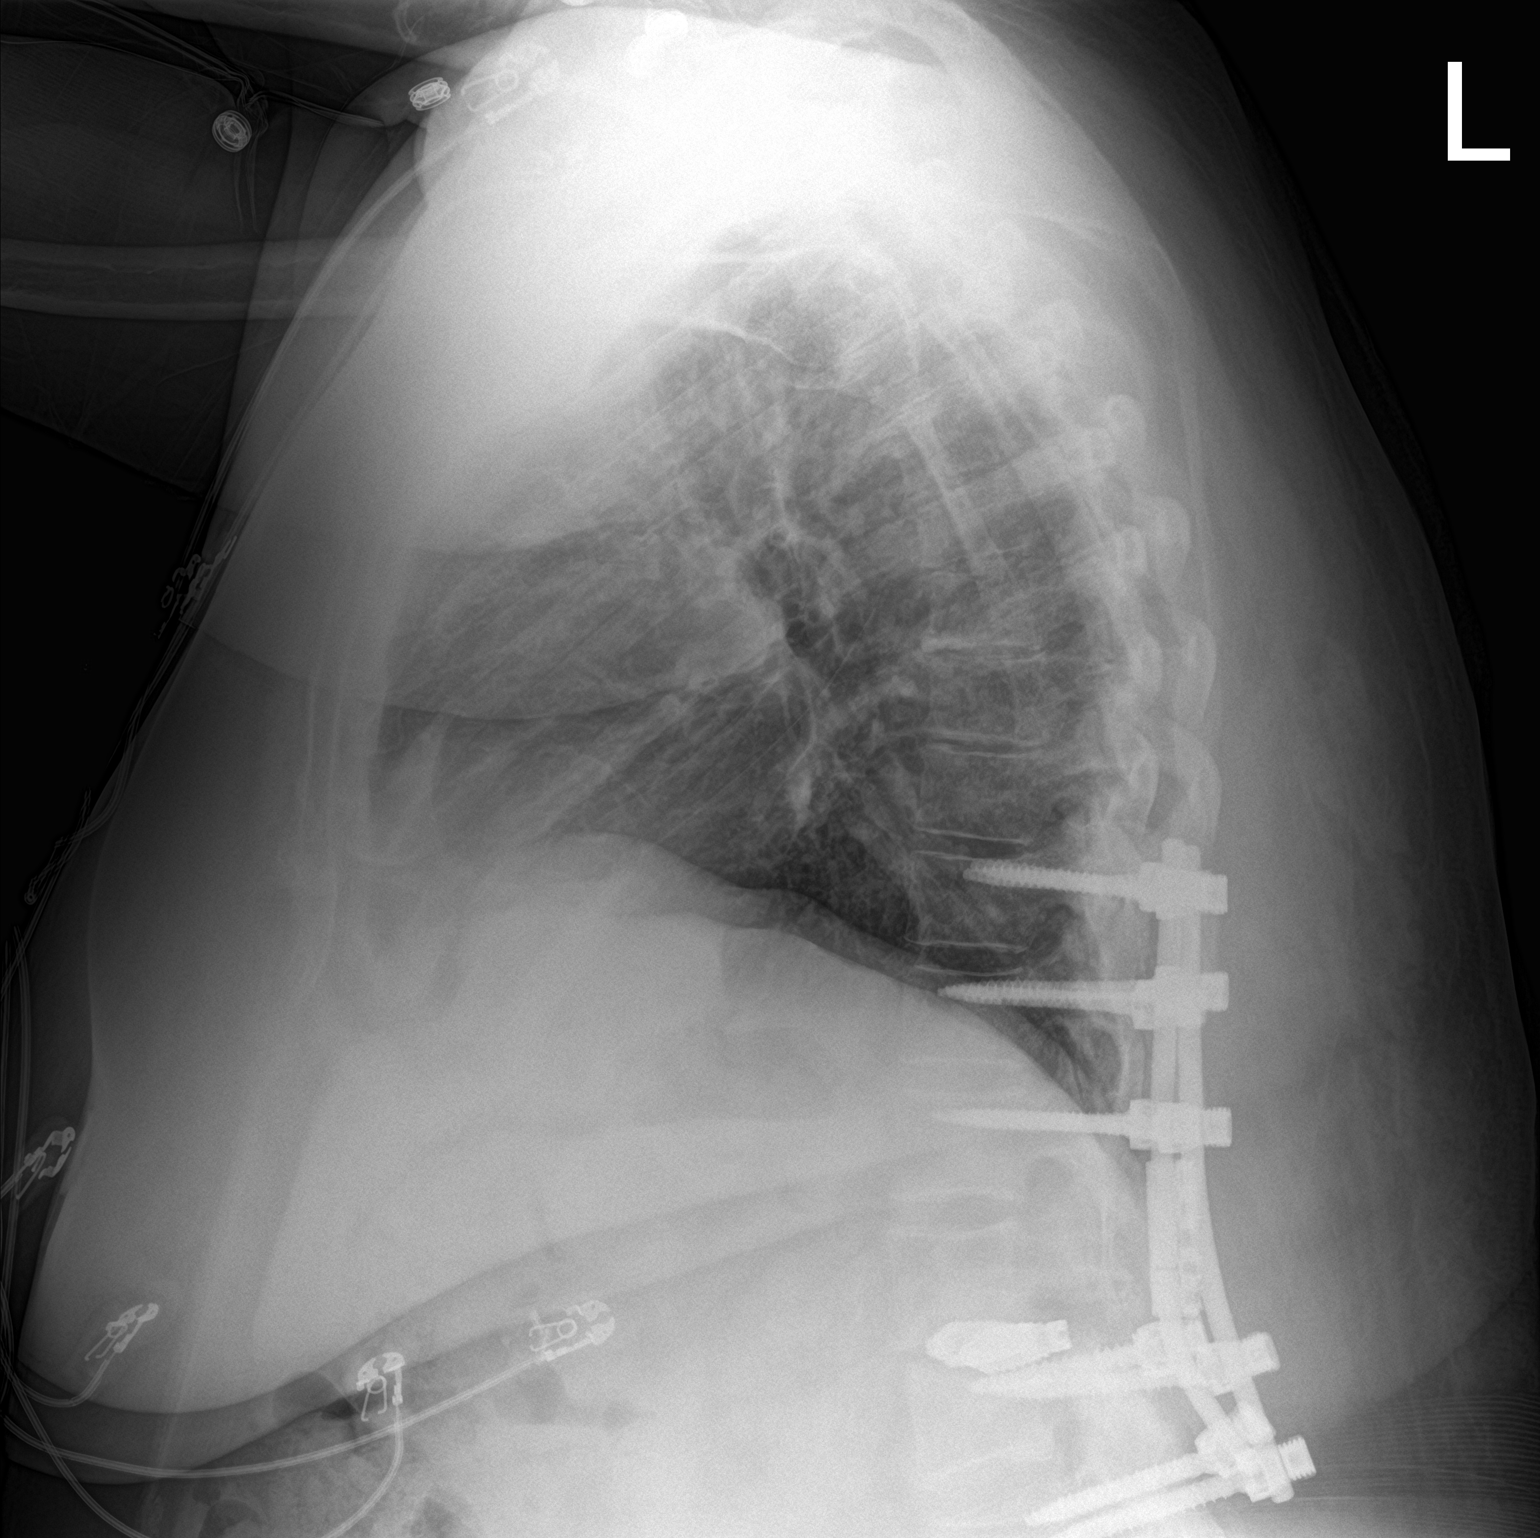

[2 of 2 positions shown; findings below may reference images not displayed]

FINDINGS: Cardiac silhouette is normal in size. No mediastinal or hilar
masses. No evidence of adenopathy.

Clear lungs.  No pleural effusion or pneumothorax.

There stable changes from a previous thoracolumbar spine fusion.
IMPRESSION: No active cardiopulmonary disease.

## 2018-10-21 DIAGNOSIS — M48062 Spinal stenosis, lumbar region with neurogenic claudication: Secondary | ICD-10-CM | POA: Diagnosis not present

## 2018-10-21 DIAGNOSIS — M542 Cervicalgia: Secondary | ICD-10-CM | POA: Diagnosis not present

## 2018-10-27 ENCOUNTER — Other Ambulatory Visit: Payer: Self-pay | Admitting: Family Medicine

## 2018-10-27 DIAGNOSIS — R519 Headache, unspecified: Secondary | ICD-10-CM

## 2018-10-27 DIAGNOSIS — R42 Dizziness and giddiness: Secondary | ICD-10-CM | POA: Diagnosis not present

## 2018-10-27 DIAGNOSIS — R51 Headache: Secondary | ICD-10-CM

## 2018-10-29 ENCOUNTER — Ambulatory Visit
Admission: RE | Admit: 2018-10-29 | Discharge: 2018-10-29 | Disposition: A | Discharge status: Home / Self Care | Payer: Medicare Other | Source: Ambulatory Visit | Attending: Family Medicine | Admitting: Family Medicine

## 2018-10-29 DIAGNOSIS — R42 Dizziness and giddiness: Secondary | ICD-10-CM

## 2018-10-29 DIAGNOSIS — G44209 Tension-type headache, unspecified, not intractable: Secondary | ICD-10-CM | POA: Diagnosis not present

## 2018-10-29 DIAGNOSIS — R51 Headache: Secondary | ICD-10-CM

## 2018-10-29 DIAGNOSIS — R519 Headache, unspecified: Secondary | ICD-10-CM

## 2018-12-01 ENCOUNTER — Other Ambulatory Visit: Payer: Self-pay | Admitting: Obstetrics and Gynecology

## 2018-12-01 ENCOUNTER — Ambulatory Visit (INDEPENDENT_AMBULATORY_CARE_PROVIDER_SITE_OTHER): Payer: Medicare Other | Admitting: Neurology

## 2018-12-01 ENCOUNTER — Encounter: Payer: Self-pay | Admitting: Neurology

## 2018-12-01 VITALS — BP 153/71 | HR 79 | Ht 65.0 in | Wt 287.0 lb

## 2018-12-01 DIAGNOSIS — G4733 Obstructive sleep apnea (adult) (pediatric): Secondary | ICD-10-CM | POA: Diagnosis not present

## 2018-12-01 DIAGNOSIS — R0683 Snoring: Secondary | ICD-10-CM | POA: Diagnosis not present

## 2018-12-01 DIAGNOSIS — R400 Somnolence: Secondary | ICD-10-CM | POA: Diagnosis not present

## 2018-12-01 DIAGNOSIS — Z1231 Encounter for screening mammogram for malignant neoplasm of breast: Secondary | ICD-10-CM
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
## 2018-12-01 NOTE — Patient Instructions (Signed)
Will send to sleep team Sleep Apnea Sleep apnea is a condition in which breathing pauses or becomes shallow during sleep. Episodes of sleep apnea usually last 10 seconds or longer, and they may occur as many as 20 times an hour. Sleep apnea disrupts your sleep and keeps your body from getting the rest that it needs. This condition can increase your risk of certain health problems, including:  Heart attack.  Stroke.  Obesity.  Diabetes.  Heart failure.  Irregular heartbeat. There are three kinds of sleep apnea:  Obstructive sleep apnea. This kind is caused by a blocked or collapsed airway.  Central sleep apnea. This kind happens when the part of the brain that controls breathing does not send the correct signals to the muscles that control breathing.  Mixed sleep apnea. This is a combination of obstructive and central sleep apnea. What are the causes? The most common cause of this condition is a collapsed or blocked airway. An airway can collapse or become blocked if:  Your throat muscles are abnormally relaxed.  Your tongue and tonsils are larger than normal.  You are overweight.  Your airway is smaller than normal. What increases the risk? This condition is more likely to develop in people who:  Are overweight.  Smoke.  Have a smaller than normal airway.  Are elderly.  Are female.  Drink alcohol.  Take sedatives or tranquilizers.  Have a family history of sleep apnea. What are the signs or symptoms? Symptoms of this condition include:  Trouble staying asleep.  Daytime sleepiness and tiredness.  Irritability.  Loud snoring.  Morning headaches.  Trouble concentrating.  Forgetfulness.  Decreased interest in sex.  Unexplained sleepiness.  Mood swings.  Personality changes.  Feelings of depression.  Waking up often during the night to urinate.  Dry mouth.  Sore throat. How is this diagnosed? This condition may be diagnosed with:  A  medical history.  A physical exam.  A series of tests that are done while you are sleeping (sleep study). These tests are usually done in a sleep lab, but they may also be done at home. How is this treated? Treatment for this condition aims to restore normal breathing and to ease symptoms during sleep. It may involve managing health issues that can affect breathing, such as high blood pressure or obesity. Treatment may include:  Sleeping on your side.  Using a decongestant if you have nasal congestion.  Avoiding the use of depressants, including alcohol, sedatives, and narcotics.  Losing weight if you are overweight.  Making changes to your diet.  Quitting smoking.  Using a device to open your airway while you sleep, such as: ? An oral appliance. This is a custom-made mouthpiece that shifts your lower jaw forward. ? A continuous positive airway pressure (CPAP) device. This device delivers oxygen to your airway through a mask. ? A nasal expiratory positive airway pressure (EPAP) device. This device has valves that you put into each nostril. ? A bi-level positive airway pressure (BPAP) device. This device delivers oxygen to your airway through a mask.  Surgery if other treatments do not work. During surgery, excess tissue is removed to create a wider airway. It is important to get treatment for sleep apnea. Without treatment, this condition can lead to:  High blood pressure.  Coronary artery disease.  (Men) An inability to achieve or maintain an erection (impotence).  Reduced thinking abilities. Follow these instructions at home:  Make any lifestyle changes that your health care provider recommends.  Eat a healthy, well-balanced diet.  Take over-the-counter and prescription medicines only as told by your health care provider.  Avoid using depressants, including alcohol, sedatives, and narcotics.  Take steps to lose weight if you are overweight.  If you were given a  device to open your airway while you sleep, use it only as told by your health care provider.  Do not use any tobacco products, such as cigarettes, chewing tobacco, and e-cigarettes. If you need help quitting, ask your health care provider.  Keep all follow-up visits as told by your health care provider. This is important. Contact a health care provider if:  The device that you received to open your airway during sleep is uncomfortable or does not seem to be working.  Your symptoms do not improve.  Your symptoms get worse. Get help right away if:  You develop chest pain.  You develop shortness of breath.  You develop discomfort in your back, arms, or stomach.  You have trouble speaking.  You have weakness on one side of your body.  You have drooping in your face. These symptoms may represent a serious problem that is an emergency. Do not wait to see if the symptoms will go away. Get medical help right away. Call your local emergency services (911 in the [place] Do not drive yourself to the hospital. This information is not intended to replace advice given to you by your health care provider. Make sure you discuss any questions you have with your health care provider. Document Released: 08/31/2002 Document Revised: 04/08/2017 Document Reviewed: 06/20/2015 Elsevier Interactive Patient Education  2019 [employer].

## 2018-12-16 IMAGING — CT CT L SPINE W/O CM
1 of 10 series · 3 of 14 positions shown, 4 images · non-contrast
Comparison: 09/25/2017

CLINICAL DATA: Subsequent encounter for lumbar fracture with
nonunion.

EXAM:
CT LUMBAR SPINE WITHOUT CONTRAST
TECHNIQUE: Multidetector CT imaging of the lumbar spine was performed without
intravenous contrast administration. Multiplanar CT image
reconstructions were also generated.

[Series 2: l spine soft · axial · 0.39mm/px · z∈[-250,-106]mm · 3 of 96 slices shown, 4 images]
[im 24/96  soft-tissue]
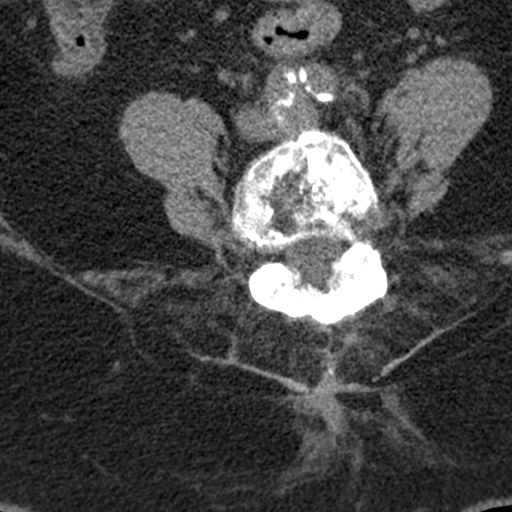
[im 24/96  bone]
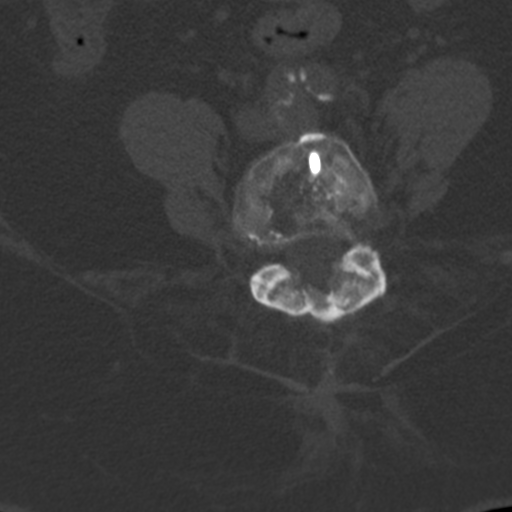
[im 48/96  bone]
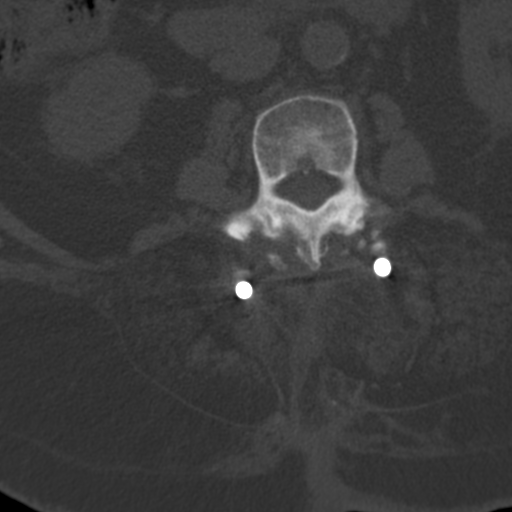
[im 72/96  bone]
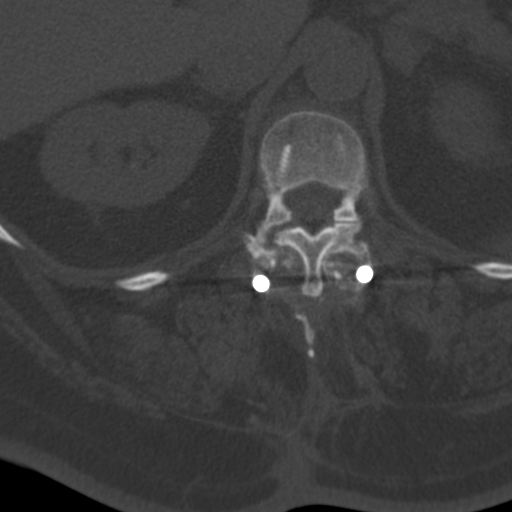

[3 of 14 positions shown; findings below may reference images not displayed]

FINDINGS: Segmentation: 5 lumbar type vertebral bodies

Alignment: Normal

Vertebrae: T10-L3 posterior rod and pedicle screw fixation, sparing
the L1 level where there was prior pedicle screw. Alif hardware at
L4-5 and L5-S1. Discectomy changes also at L3-4. Solid arthrodesis
has occurred across the L1-2 disc space, where there is chronic
posterior displacement of an intervertebral cage with spurring. This
solid bony bridging is best seen on coronal reformats. There has
been incorporation of bone graft posterolaterally with probable
solid arthrodesis from T10-L1 as well. Solid interbody and posterior
arthrodesis from L3-S1. No evidence of hardware failure. Sacroiliac
osteoarthritis with joint widening and vacuum phenomenon
bilaterally.

Paraspinal and other soft tissues: Negative

Disc levels: No evidence of bony or foraminal impingement.
Laminectomy was performed at L1-2 where there is posterior
displacement of the intervertebral cage.
IMPRESSION: T10-S1 instrumentation with probable successful interval solid
arthrodesis across the L1-2 disc space. Posterolateral bone graft
has incorporated from T10-L1 as well. Chronic solid arthrodesis from
L2-S1.

Bilateral sacroiliac osteoarthritis.

## 2018-12-22 ENCOUNTER — Institutional Professional Consult (permissible substitution): Payer: Self-pay | Admitting: Neurology

## 2019-01-08 DIAGNOSIS — M961 Postlaminectomy syndrome, not elsewhere classified: Secondary | ICD-10-CM | POA: Diagnosis not present

## 2019-01-12 ENCOUNTER — Ambulatory Visit: Payer: Medicare Other

## 2019-01-13 ENCOUNTER — Telehealth: Payer: Self-pay | Admitting: Neurology

## 2019-01-13 NOTE — Telephone Encounter (Signed)
Due to current COVID 19 pandemic, our office is severely reducing in office visits until further notice, in order to minimize the risk to our patients and healthcare providers.   Called patient and offered virtual visit for 4/20 appointment. Patient accepted and verbalized understanding of the Webex process. Patient understands that she will receive an e-mail with directions and the link she will use to connect to the meeting. I advised patient she will receive a call from RN to update chart, as well as a call from front office 30 minutes prior to her appointment to complete the check-in process. Patient agreed.  Pt understands that although there may be some limitations with this type of visit, we will take all precautions to reduce any security or privacy concerns.  Pt understands that this will be treated like an in office visit and we will file with pt's insurance, and there may be a patient responsible charge related to this service.  Pt's email is [email or web address]. Pt understands that the [employer] webex software must be downloaded and operational on the device pt plans to use for the visit.

## 2019-01-16 DIAGNOSIS — K5900 Constipation, unspecified: Secondary | ICD-10-CM | POA: Diagnosis not present

## 2019-01-16 DIAGNOSIS — R1084 Generalized abdominal pain: Secondary | ICD-10-CM | POA: Diagnosis not present

## 2019-01-16 DIAGNOSIS — K224 Dyskinesia of esophagus: Secondary | ICD-10-CM | POA: Diagnosis not present

## 2019-01-16 DIAGNOSIS — M48061 Spinal stenosis, lumbar region without neurogenic claudication: Secondary | ICD-10-CM | POA: Diagnosis not present

## 2019-01-16 DIAGNOSIS — K219 Gastro-esophageal reflux disease without esophagitis: Secondary | ICD-10-CM | POA: Diagnosis not present

## 2019-01-16 DIAGNOSIS — K660 Peritoneal adhesions (postprocedural) (postinfection): Secondary | ICD-10-CM | POA: Diagnosis not present

## 2019-01-16 DIAGNOSIS — M797 Fibromyalgia: Secondary | ICD-10-CM | POA: Diagnosis not present

## 2019-01-16 DIAGNOSIS — M545 Low back pain: Secondary | ICD-10-CM | POA: Diagnosis not present

## 2019-01-16 DIAGNOSIS — Z8719 Personal history of other diseases of the digestive system: Secondary | ICD-10-CM | POA: Diagnosis not present

## 2019-01-16 DIAGNOSIS — G609 Hereditary and idiopathic neuropathy, unspecified: Secondary | ICD-10-CM | POA: Diagnosis not present

## 2019-01-19 ENCOUNTER — Encounter: Payer: Self-pay | Admitting: Neurology

## 2019-01-19 NOTE — Telephone Encounter (Signed)
Called the patient to review their chart and made sure that everything was up to date. Patient informed they do have the app downloaded as well as received the e-mail for the visit. Instructed to make sure they hold on to the e-mail for the upcoming appointment as it is necessary to access their appointment. Instructed the patient that apx 30 min prior to the appointment the front staff will contact them to make sure they are ready to go for their appointment in case there is any need for troubleshooting it can be completed prior to the appointment time. Pt verbalized understanding. Patient also instructed to completed the sleep scale and reply with answers to that and neck circumference measurements prior to the appointment time.

## 2019-01-19 NOTE — Addendum Note (Signed)
Addended by: Darleen Crocker on: 01/19/2019 01:18 PM   Modules accepted: Orders

## 2019-01-20 DIAGNOSIS — S32009K Unspecified fracture of unspecified lumbar vertebra, subsequent encounter for fracture with nonunion: Secondary | ICD-10-CM | POA: Diagnosis not present

## 2019-01-20 DIAGNOSIS — M961 Postlaminectomy syndrome, not elsewhere classified: Secondary | ICD-10-CM | POA: Diagnosis not present

## 2019-01-22 ENCOUNTER — Ambulatory Visit (INDEPENDENT_AMBULATORY_CARE_PROVIDER_SITE_OTHER): Payer: Medicare Other | Admitting: Neurology

## 2019-01-22 ENCOUNTER — Other Ambulatory Visit: Payer: Self-pay

## 2019-01-22 ENCOUNTER — Encounter: Payer: Self-pay | Admitting: Neurology

## 2019-01-22 DIAGNOSIS — R0683 Snoring: Secondary | ICD-10-CM | POA: Diagnosis not present

## 2019-01-22 DIAGNOSIS — G4733 Obstructive sleep apnea (adult) (pediatric): Secondary | ICD-10-CM | POA: Insufficient documentation

## 2019-01-22 DIAGNOSIS — M542 Cervicalgia: Secondary | ICD-10-CM | POA: Diagnosis not present

## 2019-01-22 DIAGNOSIS — G5600 Carpal tunnel syndrome, unspecified upper limb: Secondary | ICD-10-CM | POA: Diagnosis not present

## 2019-01-22 DIAGNOSIS — R400 Somnolence: Secondary | ICD-10-CM | POA: Diagnosis not present

## 2019-01-22 DIAGNOSIS — Z6841 Body Mass Index (BMI) 40.0 and over, adult: Secondary | ICD-10-CM

## 2019-01-22 DIAGNOSIS — M961 Postlaminectomy syndrome, not elsewhere classified: Secondary | ICD-10-CM | POA: Diagnosis not present

## 2019-01-22 DIAGNOSIS — M48062 Spinal stenosis, lumbar region with neurogenic claudication: Secondary | ICD-10-CM | POA: Diagnosis not present

## 2019-01-22 NOTE — Progress Notes (Signed)
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     x6   TONSILLECTOMY      Current Outpatient Medications  Medication Sig Dispense Refill   acetaminophen (TYLENOL) 500 MG tablet Take 1,000 mg by mouth every 6 (six) hours as needed for moderate pain.      azelaic acid (AZELEX) 20 % cream Apply 1 application topically 2 (two) times daily. After skin is thoroughly washed and patted dry, gently but thoroughly massage a thin film of azelaic acid cream into  the affected area twice daily, in the morning and evening.     B Complex Vitamins (B COMPLEX PO) Take by mouth.     calcium-vitamin D (OSCAL WITH D) 500-200 MG-UNIT  tablet Take 1 tablet by mouth daily with breakfast.     cloNIDine (CATAPRES) 0.1 MG tablet Take 1 tablet (0.1 mg total) by mouth 3 (three) times daily. 60 tablet 11   diphenhydrAMINE (BENADRYL) 25 MG tablet Take 25 mg by mouth 2 (two) times daily as needed for allergies.     estradiol (ESTRACE) 0.1 MG/GM vaginal cream Place 1 Applicatorful vaginally 2 (two) times a week. (Patient taking differently: Place 1 Applicatorful vaginally 2 (two) times a week. Tuesday and Thursday) 42.5 g 12   fluticasone (FLONASE) 50 MCG/ACT nasal spray Place 1 spray into both nostrils daily.  2   gabapentin (NEURONTIN) 300 MG capsule Take 300 mg by mouth 2 (two) times daily.     hydrALAZINE (APRESOLINE) 50 MG tablet Take 50 mg by mouth 3 (three) times daily.     losartan (COZAAR) 50 MG tablet Take 100 mg by mouth daily.     meloxicam (MOBIC) 15 MG tablet Take 15 mg by mouth daily.     Menthol-Methyl Salicylate (MUSCLE RUB EX) Apply 1 application topically 2 (two) times daily as needed (pain in neck and back).      methocarbamol (ROBAXIN) 500 MG tablet Take 500 mg by mouth 2 (two) times daily as needed for muscle spasms.     metoprolol (LOPRESSOR) 50 MG tablet Take 1 tablet (50 mg total) by mouth 2 (two) times daily.     NIFEdipine (PROCARDIA) 10 MG capsule Take 10 mg by mouth 3 (three) times daily.     OVER THE COUNTER MEDICATION Take 2 tablets by mouth daily. Swiss Kriss OTC herbal laxative     pantoprazole (PROTONIX) 40 MG tablet Take 80 mg by mouth daily.   0   PARoxetine (PAXIL) 30 MG tablet Take 30 mg by mouth daily.     potassium chloride SA (K-DUR,KLOR-CON) 20 MEQ tablet Take 20 mEq by mouth daily.     tapentadol (NUCYNTA) 50 MG tablet Take 50 mg by mouth every 6 (six) hours as needed for moderate pain.     vitamin B-12  (CYANOCOBALAMIN) 1000 MCG tablet Take 1,000 mcg by mouth daily.     vitamin E (VITAMIN E) 400 UNIT capsule Take 400 Units by mouth daily.     No current facility-administered medications for this visit.     Allergies as of 01/22/2019 - Review Complete 01/22/2019  Allergen Reaction Noted   Latex Other (See Comments) 02/05/2013   Crestor [rosuvastatin calcium]  12/01/2018   Penicillins Other (See Comments) 02/03/2013   Tetanus toxoids Rash 12/01/2018   Tomato Rash 10/15/2017    Vitals: There were no vitals taken for this visit. Last Weight:  Wt Readings from Last 1 Encounters:  12/01/18 287 lb (130.2 kg)   Last Height:   Ht Readings from Last 1 Encounters:  12/01/18 5\' 5"  (1.651 m)     Physical exam: Exam: Gen: NAD, conversant, well nourised, morbidly obese, well groomed                     CV: RRR, no MRG. No Carotid Bruits. No peripheral edema, warm, nontender Eyes: Conjunctivae clear without exudates or hemorrhage  Neuro: Detailed Neurologic Exam  Speech:    Speech is without aphasia/dysarthria. Cognition:    The patient is oriented to person, place, and time;     recent and remote memory intact;     language fluent;     normal attention, concentration,     fund of knowledge Cranial Nerves:  The pupils are equal, round, and reactive to light. Attempted fundoscopy could not visualize due to small pupils.. Visual fields are full to finger confrontation. Extraocular movements are intact. Trigeminal sensation is intact and the muscles of mastication are normal. The face is symmetric. The palate elevates in the midline. Hearing intact. Voice is strangled. Shoulder shrug is normal. The tongue has normal motion without fasciculations.   Coordination:    No dysmetria  Gait:    Uses a cane, slightly wide based may be due to morbid obesity, imbalance  Motor Observation:    No asymmetry, no atrophy, and no involuntary movements noted. Tone:    Normal muscle tone.     Posture:    Posture is normal. normal erect    Strength:    Strength is V/V in the upper and lower limbs.      Sensation: intact to LT     Reflex Exam:  DTR's:    Deep tendon reflexes in the upper and lower extremities are symmetrical bilaterally.   Toes:    The toes are equivocal bilaterally.   Clonus:    Clonus is absent.    Assessment/Plan:   73 y.o. female here as requested by provider Kelton Pillar, MD for headaches likely untreated sleep apnea.  Mallampati 4, ESS 16  In 2000 had AHI 10 with desat 83% now worsening hypersomnolence, diffuse headache during the day and morning headaches, morbid obesity, severe snoring. Needs sleep study will send.   Orders Placed This Encounter  Procedures   Home sleep test    Cc: Kelton Pillar, MD,    Sarina Ill, MD  [facility] [address] [place], [place] [address]  Phone [phone] Fax [phone]

## 2019-01-22 NOTE — Patient Instructions (Signed)
Screening for Sleep Apnea  Sleep apnea is a condition in which breathing pauses or becomes shallow during sleep. Sleep apnea screening is a test to determine if you are at risk for sleep apnea. The test is easy and only takes a few minutes. Your health care provider may ask you to have this test in preparation for surgery or as part of a physical exam. What are the symptoms of sleep apnea? Common symptoms of sleep apnea include:  Snoring.  Restless sleep.  Daytime sleepiness.  Pauses in breathing.  Choking during sleep.  Irritability.  Forgetfulness.  Trouble thinking clearly.  Depression.  Personality changes. Most people with sleep apnea are not aware that they have it. Why should I get screened? Getting screened for sleep apnea can help:  Ensure your safety. It is important for your health care providers to know whether or not you have sleep apnea, especially if you are having surgery or have other long-term (chronic) health conditions.  Improve your health and allow you to get a better night's rest. Restful sleep can help you: ? Have more energy. ? Lose weight. ? Improve high blood pressure. ? Improve diabetes management. ? Prevent stroke. ? Prevent car accidents. How is screening done? Screening usually includes being asked a list of questions about your sleep quality. Some questions you may be asked include:  Do you snore?  Is your sleep restless?  Do you have daytime sleepiness?  Has a partner or spouse told you that you stop breathing during sleep?  Have you had trouble concentrating or memory loss? If your screening test is positive, you are at risk for the condition. Further testing may be needed to confirm a diagnosis of sleep apnea. Where to find more information You can find screening tools online or at your health care clinic. For more information about sleep apnea screening and healthy sleep, visit these websites:  Centers for Disease Control and  Prevention: www.cdc.gov/sleep/index.html  American Sleep Apnea Association: www.sleepapnea.org Contact a health care provider if:  You think that you may have sleep apnea. Summary  Sleep apnea screening can help determine if you are at risk for sleep apnea.  It is important for your health care providers to know whether or not you have sleep apnea, especially if you are having surgery or have other chronic health conditions.  You may be asked to take a screening test for sleep apnea in preparation for surgery or as part of a physical exam. This information is not intended to replace advice given to you by your health care provider. Make sure you discuss any questions you have with your health care provider. Document Released: 12/21/2016 Document Revised: 12/21/2016 Document Reviewed: 12/21/2016 Elsevier Interactive Patient Education  2019 [employer]  

## 2019-02-16 ENCOUNTER — Encounter (HOSPITAL_COMMUNITY): Payer: Self-pay | Admitting: Emergency Medicine

## 2019-02-16 ENCOUNTER — Emergency Department (HOSPITAL_COMMUNITY)
Admission: EM | Admit: 2019-02-16 | Discharge: 2019-02-16 | Disposition: A | Discharge status: Home / Self Care | Payer: Medicare Other | Attending: Emergency Medicine | Admitting: Emergency Medicine

## 2019-02-16 ENCOUNTER — Emergency Department (HOSPITAL_COMMUNITY): Payer: Medicare Other

## 2019-02-16 ENCOUNTER — Other Ambulatory Visit: Payer: Self-pay

## 2019-02-16 DIAGNOSIS — S8991XA Unspecified injury of right lower leg, initial encounter: Secondary | ICD-10-CM | POA: Diagnosis not present

## 2019-02-16 DIAGNOSIS — Z880 Allergy status to penicillin: Secondary | ICD-10-CM | POA: Insufficient documentation

## 2019-02-16 DIAGNOSIS — S79911A Unspecified injury of right hip, initial encounter: Secondary | ICD-10-CM | POA: Diagnosis not present

## 2019-02-16 DIAGNOSIS — Z87891 Personal history of nicotine dependence: Secondary | ICD-10-CM | POA: Diagnosis not present

## 2019-02-16 DIAGNOSIS — Z91040 Latex allergy status: Secondary | ICD-10-CM | POA: Diagnosis not present

## 2019-02-16 DIAGNOSIS — Z79899 Other long term (current) drug therapy: Secondary | ICD-10-CM | POA: Diagnosis not present

## 2019-02-16 DIAGNOSIS — S93401A Sprain of unspecified ligament of right ankle, initial encounter: Secondary | ICD-10-CM | POA: Diagnosis not present

## 2019-02-16 DIAGNOSIS — W06XXXA Fall from bed, initial encounter: Secondary | ICD-10-CM | POA: Insufficient documentation

## 2019-02-16 DIAGNOSIS — S93491A Sprain of other ligament of right ankle, initial encounter: Secondary | ICD-10-CM | POA: Insufficient documentation

## 2019-02-16 DIAGNOSIS — I10 Essential (primary) hypertension: Secondary | ICD-10-CM | POA: Insufficient documentation

## 2019-02-16 DIAGNOSIS — M79604 Pain in right leg: Secondary | ICD-10-CM | POA: Diagnosis not present

## 2019-02-16 DIAGNOSIS — Y92003 Bedroom of unspecified non-institutional (private) residence as the place of occurrence of the external cause: Secondary | ICD-10-CM | POA: Insufficient documentation

## 2019-02-16 DIAGNOSIS — M25571 Pain in right ankle and joints of right foot: Secondary | ICD-10-CM | POA: Diagnosis not present

## 2019-02-16 DIAGNOSIS — S99911A Unspecified injury of right ankle, initial encounter: Secondary | ICD-10-CM | POA: Diagnosis not present

## 2019-02-16 DIAGNOSIS — Y9389 Activity, other specified: Secondary | ICD-10-CM | POA: Insufficient documentation

## 2019-02-16 DIAGNOSIS — M7989 Other specified soft tissue disorders: Secondary | ICD-10-CM | POA: Diagnosis not present

## 2019-02-16 DIAGNOSIS — Y999 Unspecified external cause status: Secondary | ICD-10-CM | POA: Diagnosis not present

## 2019-02-16 DIAGNOSIS — M25551 Pain in right hip: Secondary | ICD-10-CM | POA: Diagnosis not present

## 2019-02-16 DIAGNOSIS — S99921A Unspecified injury of right foot, initial encounter: Secondary | ICD-10-CM | POA: Diagnosis not present

## 2019-02-16 DIAGNOSIS — J45909 Unspecified asthma, uncomplicated: Secondary | ICD-10-CM | POA: Insufficient documentation

## 2019-02-16 MED ORDER — ACETAMINOPHEN 500 MG PO TABS
1000.0000 mg | ORAL_TABLET | Freq: Once | ORAL | Status: AC
  Administered 2019-02-16: 1000 mg via ORAL
  Filled 2019-02-16: qty 2

## 2019-02-16 MED ORDER — DICLOFENAC SODIUM 1 % TD GEL: 4.0000 g | Freq: Four times a day (QID) | TRANSDERMAL | 0 refills | Status: DC

## 2019-02-16 MED ORDER — OXYCODONE HCL 5 MG PO TABS
2.5000 mg | ORAL_TABLET | Freq: Once | ORAL | Status: AC
  Administered 2019-02-16: 16:00:00 2.5 mg via ORAL
  Filled 2019-02-16: qty 1

## 2019-02-16 NOTE — Discharge Instructions (Signed)
Your x-ray shows no bony injury.  Most likely this is a sprain.  Please follow-up with your family doctor.

## 2019-02-16 NOTE — ED Notes (Signed)
Patient transported to X-ray 

## 2019-02-16 NOTE — ED Triage Notes (Signed)
Pt fell out of bed and hurt right ankle on Saturday night. Able to put weight on leg Sunday and then last night "foot gave out" and today having increased pain in foot. Denies cough SOB, fever. AO x 4

## 2019-02-16 NOTE — ED Notes (Signed)
Paged ortho 

## 2019-02-16 NOTE — ED Provider Notes (Signed)
[facility] EMERGENCY DEPARTMENT Provider Note   CSN: [id number] Arrival date & time: 02/16/19  1513    History   Chief Complaint Chief Complaint  Patient presents with  . Ankle Pain    HPI Amy [name] is a 73 y.o. female.     73 yo F with a chief complaint of right ankle pain.  This started a couple days ago.  The patient fell out of bed and her right leg was folded up underneath her.  Complaining of pain to the lateral aspect of the ankle of the right knee and her right hip.  Pain is worse with bearing weight.  She was initially able to walk but now it is gotten more painful over the past few days.  She struck the right side of her chest as well.  Denies head injury denies neck pain denies back pain.  The history is provided by the patient.  Ankle Pain  Location:  Ankle Time since incident:  3 days Injury: yes   Mechanism of injury: fall   Fall:    Fall occurred:  From a bed   Height of fall:  4   Impact surface:  Hard floor   Point of impact: on right leg.   Entrapped after fall: no   Ankle location:  R ankle Pain details:    Quality:  Sharp and shooting   Radiates to: to the right knee and right hip.   Severity:  Moderate   Onset quality:  Gradual   Duration:  3 days   Timing:  Constant   Progression:  Worsening Chronicity:  New Dislocation: no   Foreign body present:  No foreign bodies Tetanus status:  Unknown Prior injury to area:  No Relieved by:  Nothing Worsened by:  Bearing weight and activity Ineffective treatments:  None tried Associated symptoms: no fever     Past Medical History:  Diagnosis Date  . Abdominal pain of unknown etiology 02/05/2016  . Anxiety   . Arthritis    ddd- RA  . Asthma    "sleeping asthma"  . Chronic back pain    lumbar steroid injection's  . Complication of anesthesia    Hard to wake up. Pt sts is took 3 days.  . Constipation   . Depression   . Dysphonia    intermittent "voice  changes"  . Esophageal dysmotility   . Fibromyalgia   . Fibromyalgia   . GERD (gastroesophageal reflux disease)   . Glaucoma   . Headache   . High cholesterol    ? pt states her doctor told her to continue eating vegetables & fruit  . History of DVT (deep vein thrombosis) 1989   LEFT UPPER ARM  . History of hiatal hernia   . History of MRSA infection 2011  . Hypertension   . Irregular heart rate    "years ago"  . Low iron   . Lumbago   . Mild obstructive sleep apnea    per study 02-07-2006 - no cpap  . Neuropathy    feet  . Pelvic pain in female   . Peripheral vascular disease ([place])    "poor circulation"  . SBO (small bowel obstruction) ([place]) 06/2016  . Seasonal allergies   . Short of breath on exertion   . Urinary frequency   . Vaginal atrophy   . Weakness    both hands and feet    Patient Active Problem List   Diagnosis Date Noted  .  Snoring 01/22/2019  . Morbid obesity with body mass index of 45.0-49.9 in adult [facility]) 01/22/2019  . OSA (obstructive sleep apnea) 01/22/2019  . Wound dehiscence 07/22/2016  . Dyspnea 07/21/2016  . Acute encephalopathy 07/21/2016  . Anemia 07/21/2016  . Pancreatitis 07/02/2016  . SBO (small bowel obstruction) ([place]) 07/02/2016  . Long term current use of opiate analgesic 06/26/2016  . Long term prescription opiate use 06/26/2016  . Opiate use 06/26/2016  . Encounter for therapeutic drug level monitoring 06/26/2016  . Encounter for pain management planning 06/26/2016  . Chronic pain 06/26/2016  . Disturbance of skin sensation 06/26/2016  . Chronic low back pain (Location of Primary Source of Pain) (Bilateral) (R>L) 06/26/2016  . Chronic hip pain (Location of Secondary source of pain) (Bilateral) (R>L) 06/26/2016  . 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 Chronic shoulder pain (Location of Tertiary source of pain) (Bilateral) (R>L) 06/26/2016  . 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 Chronic knee  pain (Bilateral) (R>L) 06/26/2016  . 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 Chronic neck pain (Right) 06/26/2016  . Lumbar spondylosis 06/26/2016  . Lumbar facet syndrome 06/26/2016  . Lumbar facet hypertrophy 06/26/2016  . 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 Neurogenic pain 06/26/2016  . Occipital headaches 06/26/2016  . 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 History of lumbar fusion 06/26/2016  . 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  . Neuropathic pain of both legs 10/13/2015  . Constipation 10/13/2015  . Complete rotator cuff tear of left shoulder 05/05/2014  . Depression 08/06/2013  . Hypokalemia 08/06/2013  . GERD (gastroesophageal reflux disease) 08/06/2013  . Spinal stenosis of lumbar region 08/06/2013  . Bladder spasm 08/06/2013  . Obesity, Class III, BMI 40-49.9 (morbid obesity) ([place]) 02/03/2013  . Lipoma of buttock s/p excision 02/20/2013 02/03/2013  . DYSPNEA 01/23/2008  . Obstructive sleep apnea 09/12/2007  . HTN (hypertension) 09/12/2007  . Allergic rhinitis 09/12/2007  . SLEEPINESS 09/12/2007  . ANGINA, HX OF 09/12/2007    Past Surgical History:  Procedure Laterality Date  . abdominal adhesions removed    . ABDOMINAL HYSTERECTOMY  1978  . BACK SURGERY    . BUNIONECTOMY Left 2008  . CATARACT EXTRACTION Bilateral 2017  . COLON SURGERY    . COLONOSCOPY    . CYSTO WITH HYDRODISTENSION N/A 07/12/2015   Procedure: CYSTOSCOPY/HYDRODISTENSION;  Surgeon: Bjorn Loser, MD;  Location: [facility];  Service: Urology;  Laterality: N/A;  . DILATION AND CURETTAGE OF UTERUS    . ESOPHAGEAL MANOMETRY N/A 11/22/2014   Procedure: ESOPHAGEAL MANOMETRY (EM);  Surgeon: Winfield Cunas., MD;  Location: [facility];  Service: Endoscopy;  Laterality: N/A;  . ESOPHAGOGASTRODUODENOSCOPY N/A 07/15/2013   Procedure: ESOPHAGOGASTRODUODENOSCOPY (EGD);  Surgeon: Winfield Cunas., MD;  Location: [facility];  Service: Endoscopy;  Laterality: N/A;  need xray  . EXPLORATORY LAPAROTOMY    . EYE SURGERY Bilateral    cataract surgery with lens implants  . HARDWARE REMOVAL N/A 01/02/2016   Procedure: Exploration of Lumbar Fusion,Removal of hardware Lumbar One-Two ;Redo Posterior Lumbar Fusion Lumbar One-Two;  Surgeon: Kary Kos, MD;  Location: [facility];  Service: Neurosurgery;  Laterality: N/A;  . ingrown toenail removal    . LAPAROSCOPIC CHOLECYSTECTOMY  01-11-2006  . LAPAROSCOPY N/A 07/06/2016   Procedure: LAPAROSCOPY DIAGNOSTIC EMERGENT TO OPEN;  Surgeon: Greer Pickerel, MD;  Location: [facility];  Service: General;  Laterality: N/A;  . LAPAROTOMY N/A 07/06/2016  Procedure: EXPLORATORY LAPAROTOMY LYSIS OF ADHESIONS FOR 3 HOURS;  Surgeon: Greer Pickerel, MD;  Location: [facility];  Service: General;  Laterality: N/A;  . lipoma removal    . LUMBAR FUSION  2014   L4 -- L5  . MASS EXCISION Left 02/20/2013   Procedure: EXCISION LEFT BUTTOCK  MASS;  Surgeon: Adin Hector, MD;  Location: [facility];  Service: General;  Laterality: Left;  . NOSE SURGERY  2007  . REMOVAL HARDWARE L4-L5/  BILATERAL LAMINECTOMY L2 - L5 AND FUSION  06-12-2011  . SHOULDER OPEN ROTATOR CUFF REPAIR Left 05/05/2014   Procedure: OPEN ACROMIONECTOMY AND OPEN REPAIR OF ROTATOR CUFF, TISSUEMEND GRAFT WITH ANCHOR ;  Surgeon: Tobi Bastos, MD;  Location: [facility];  Service: Orthopedics;  Laterality: Left;  . SPINE SURGERY     x6  . TONSILLECTOMY       OB History   No obstetric history on file.      Home Medications    Prior to Admission medications   Medication Sig Start Date End Date Taking? Authorizing Provider  acetaminophen (TYLENOL) 500 MG tablet Take 1,000 mg by mouth every 6 (six) hours as needed for moderate pain.     [provider]  azelaic acid (AZELEX) 20 % cream Apply 1 application topically 2 (two) times daily. After skin is thoroughly washed and patted dry, gently but thoroughly massage a thin film of  azelaic acid cream into the affected area twice daily, in the morning and evening.    [provider]  B Complex Vitamins (B COMPLEX PO) Take by mouth.    [provider]  calcium-vitamin D (OSCAL WITH D) 500-200 MG-UNIT tablet Take 1 tablet by mouth daily with breakfast. 08/02/16   Meuth, Jerene Pitch A, PA-C  cloNIDine (CATAPRES) 0.1 MG tablet Take 1 tablet (0.1 mg total) by mouth 3 (three) times daily. 08/01/16   Meuth, Brooke A, PA-C  diclofenac sodium (VOLTAREN) 1 % GEL Apply 4 g topically 4 (four) times daily. 02/16/19   Deno Etienne, DO  diphenhydrAMINE (BENADRYL) 25 MG tablet Take 25 mg by mouth 2 (two) times daily as needed for allergies.    [provider]  estradiol (ESTRACE) 0.1 MG/GM vaginal cream Place 1 Applicatorful vaginally 2 (two) times a week. Patient taking differently: Place 1 Applicatorful vaginally 2 (two) times a week. Tuesday and Thursday 08/02/16   Margie Billet A, PA-C  fluticasone (FLONASE) 50 MCG/ACT nasal spray Place 1 spray into both nostrils daily. 08/02/16   Meuth, Brooke A, PA-C  gabapentin (NEURONTIN) 300 MG capsule Take 300 mg by mouth 2 (two) times daily.    [provider]  hydrALAZINE (APRESOLINE) 50 MG tablet Take 50 mg by mouth 3 (three) times daily.    [provider]  losartan (COZAAR) 50 MG tablet Take 100 mg by mouth daily.    [provider]  meloxicam (MOBIC) 15 MG tablet Take 15 mg by mouth daily.    [provider]  Menthol-Methyl Salicylate (MUSCLE RUB EX) Apply 1 application topically 2 (two) times daily as needed (pain in neck and back).     [provider]  methocarbamol (ROBAXIN) 500 MG tablet Take 500 mg by mouth 2 (two) times daily as needed for muscle spasms.    [provider]  metoprolol (LOPRESSOR) 50 MG tablet Take 1 tablet (50 mg total) by mouth 2 (two) times daily. 08/01/16   Meuth, Brooke A, PA-C  NIFEdipine (PROCARDIA) 10 MG capsule Take 10 mg  by mouth 3 (three) times  daily.    [provider]  OVER THE COUNTER MEDICATION Take 2 tablets by mouth daily. Swiss Kriss OTC herbal laxative    [provider]  pantoprazole (PROTONIX) 40 MG tablet Take 80 mg by mouth daily.  01/17/18   [provider]  PARoxetine (PAXIL) 30 MG tablet Take 30 mg by mouth daily.    [provider]  potassium chloride SA (K-DUR,KLOR-CON) 20 MEQ tablet Take 20 mEq by mouth daily.    [provider]  tapentadol (NUCYNTA) 50 MG tablet Take 50 mg by mouth every 6 (six) hours as needed for moderate pain.    [provider]  vitamin B-12 (CYANOCOBALAMIN) 1000 MCG tablet Take 1,000 mcg by mouth daily.    [provider]  vitamin E (VITAMIN E) 400 UNIT capsule Take 400 Units by mouth daily.    [provider]    Family History Family History  Problem Relation Age of Onset  . Hypertension Mother   . Cancer Mother        cervix  . Kidney disease Mother   . Osteoarthritis Mother   . Diabetes Mother   . Hypertension Sister   . Cancer Sister        polyps  . Kidney disease Brother   . Cancer Daughter        leukemia  . Hypertension Brother   . Liver cancer Brother   . Hypertension Father   . Colon cancer Father   . Colon polyps Father   . Hypertension Brother   . Liver cancer Brother   . Aneurysm Maternal Grandmother   . Migraines Neg Hx   . Headache Neg Hx     Social History Social History   Tobacco Use  . Smoking status: Former Smoker    Last attempt to quit: 07/10/1969    Years since quitting: 49.6  . Smokeless tobacco: Never Used  Substance Use Topics  . Alcohol use: Not Currently    Comment: rare, social  . Drug use: No     Allergies   Latex; Crestor [rosuvastatin calcium]; Penicillins; Tetanus toxoids; and Tomato   Review of Systems Review of Systems  Constitutional: Negative for chills and fever.  HENT: Negative for congestion and rhinorrhea.   Eyes: Negative for redness and visual  disturbance.  Respiratory: Negative for shortness of breath and wheezing.   Cardiovascular: Negative for chest pain and palpitations.  Gastrointestinal: Negative for nausea and vomiting.  Genitourinary: Negative for dysuria and urgency.  Musculoskeletal: Positive for arthralgias and myalgias.  Skin: Negative for pallor and wound.  Neurological: Negative for dizziness and headaches.     Physical Exam Updated Vital Signs BP (!) 177/81 (BP Location: Right Arm)   Pulse 75   Temp 98 F (36.7 C) (Oral)   Resp 20   Ht 5\' 5"  (1.651 m)   Wt 127 kg   SpO2 100%   BMI 46.59 kg/m   Physical Exam Vitals signs and nursing note reviewed.  Constitutional:      General: She is not in acute distress.    Appearance: She is well-developed. She is not diaphoretic.  HENT:     Head: Normocephalic and atraumatic.  Eyes:     Pupils: Pupils are equal, round, and reactive to light.  Neck:     Musculoskeletal: Normal range of motion and neck supple.  Cardiovascular:     Rate and Rhythm: Normal rate and regular rhythm.  Heart sounds: No murmur. No friction rub. No gallop.   Pulmonary:     Effort: Pulmonary effort is normal.     Breath sounds: No wheezing or rales.  Abdominal:     General: There is no distension.     Palpations: Abdomen is soft.     Tenderness: There is no abdominal tenderness.  Musculoskeletal:        General: Tenderness present.     Comments: Tender to palpation and some edema to the right ankle.  She has no pain to the foot no pain at the base of the fifth metatarsal or at the navicular.  Pulse motor and sensation are intact distally.  There is fibular neck tenderness.  She has some mild tenderness with internal and external rotation right hip.  Skin:    General: Skin is warm and dry.  Neurological:     Mental Status: She is alert and oriented to person, place, and time.  Psychiatric:        Behavior: Behavior normal.      ED Treatments / Results  Labs (all labs  ordered are listed, but only abnormal results are displayed) Labs Reviewed - No data to display  EKG None  Radiology Dg Tibia/fibula Right  Result Date: 02/16/2019 CLINICAL DATA:  Status post fall out of bed.  Right leg pain. EXAM: RIGHT TIBIA AND FIBULA - 2 VIEW COMPARISON:  Knee x-ray 06/27/2016 FINDINGS: There is no evidence of fracture or other focal bone lesions. Soft tissue swelling over the lateral malleolus. IMPRESSION: No acute osseous injury of the right tibia and fibula. Electronically Signed   By: Kathreen Devoid   On: 02/16/2019 16:44   Dg Ankle Complete Right  Result Date: 02/16/2019 CLINICAL DATA:  Pain following fall EXAM: RIGHT ANKLE - COMPLETE 3+ VIEW COMPARISON:  None. FINDINGS: Frontal, oblique, and lateral views were obtained. There is mild generalized soft tissue swelling. There is no evident acute fracture or joint effusion. There is no appreciable joint space narrowing or erosion. There is a minimal inferior calcaneal spur. Ankle mortise appears intact. IMPRESSION: Mild generalized soft tissue swelling. No evident fracture. Ankle mortise appears intact. No appreciable arthropathy. Minimal inferior calcaneal spur. Electronically Signed   By: Lowella Grip III M.D.   On: 02/16/2019 16:46   Dg Foot Complete Right  Result Date: 02/16/2019 CLINICAL DATA:  Status post fall, right ankle pain EXAM: RIGHT FOOT COMPLETE - 3+ VIEW COMPARISON:  None. FINDINGS: There is no evidence of fracture or dislocation. There is no evidence of arthropathy or other focal bone abnormality. Soft tissues are unremarkable. IMPRESSION: No acute osseous injury of the right foot. Electronically Signed   By: Kathreen Devoid   On: 02/16/2019 15:52   Dg Hip Unilat W Or Wo Pelvis 2-3 Views Right  Result Date: 02/16/2019 CLINICAL DATA:  Pain following fall EXAM: DG HIP (WITH OR WITHOUT PELVIS) 2-3V RIGHT COMPARISON:  June 27, 2016 FINDINGS: Frontal pelvis as well as frontal and lateral right hip joint images  were obtained. No acute fracture or dislocation evident. There is moderate symmetric narrowing of each hip joint. There is evidence of bilateral sacroiliitis as well as osteitis condensans pubis, stable in appearance. There is postoperative change in the visualized lower lumbar spine with extensive arthropathy in the visualized lower lumbar spine. IMPRESSION: 1. 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 Moderate symmetric narrowing of each hip joint. No acute fracture or dislocation. Electronically Signed  By: Lowella Grip III M.D.   On: 02/16/2019 16:45    Procedures Procedures (including critical care time)  Medications Ordered in ED Medications  acetaminophen (TYLENOL) tablet 1,000 mg (1,000 mg Oral Given 02/16/19 1609)  oxyCODONE (Oxy IR/ROXICODONE) immediate release tablet 2.5 mg (2.5 mg Oral Given 02/16/19 1609)     Initial Impression / Assessment and Plan / ED Course  I have reviewed the triage vital signs and the nursing notes.  Pertinent labs & imaging results that were available during my care of the patient were reviewed by me and considered in my medical decision making (see chart for details).        73 yo F with a chief complaint of right leg pain after fall.  Patient had a plain film ordered in triage of the foot that is negative.  Patient is having more pain in the ankle and the hip will obtain a plain film.  Plain films are negative for fractures the by me.  Placed in a cam walker as the patient would be unlikely to be able to tolerate a splint and crutches.  PCP follow-up.  5:03 PM:  I have discussed the diagnosis/risks/treatment options with the patient and believe the pt to be eligible for discharge home to follow-up with PCP. We also discussed returning to the ED immediately if new or worsening sx occur. We discussed the sx which are most concerning (e.g., sudden worsening pain, fever, inability to tolerate  by mouth) that necessitate immediate return. Medications administered to the patient during their visit and any new prescriptions provided to the patient are listed below.  Medications given during this visit Medications  acetaminophen (TYLENOL) tablet 1,000 mg (1,000 mg Oral Given 02/16/19 1609)  oxyCODONE (Oxy IR/ROXICODONE) immediate release tablet 2.5 mg (2.5 mg Oral Given 02/16/19 1609)     The patient appears reasonably screen and/or stabilized for discharge and I doubt any other medical condition or other [facility] requiring further screening, evaluation, or treatment in the ED at this time prior to discharge.    Final Clinical Impressions(s) / ED Diagnoses   Final diagnoses:  Sprain of anterior talofibular ligament of right ankle, initial encounter    ED Discharge Orders         Ordered    diclofenac sodium (VOLTAREN) 1 % GEL  4 times daily     02/16/19 [place], Dan, DO 02/16/19 1703

## 2019-02-17 ENCOUNTER — Ambulatory Visit (INDEPENDENT_AMBULATORY_CARE_PROVIDER_SITE_OTHER): Payer: Medicare Other | Admitting: Neurology

## 2019-02-17 DIAGNOSIS — G4733 Obstructive sleep apnea (adult) (pediatric): Secondary | ICD-10-CM

## 2019-02-17 DIAGNOSIS — R400 Somnolence: Secondary | ICD-10-CM

## 2019-02-17 DIAGNOSIS — R0683 Snoring: Secondary | ICD-10-CM

## 2019-02-23 ENCOUNTER — Ambulatory Visit
Admission: RE | Admit: 2019-02-23 | Discharge: 2019-02-23 | Disposition: A | Discharge status: Home / Self Care | Payer: Medicare Other | Source: Ambulatory Visit | Attending: Obstetrics and Gynecology | Admitting: Obstetrics and Gynecology

## 2019-02-23 ENCOUNTER — Other Ambulatory Visit: Payer: Self-pay

## 2019-02-23 DIAGNOSIS — Z1231 Encounter for screening mammogram for malignant neoplasm of breast: Secondary | ICD-10-CM

## 2019-02-25 DIAGNOSIS — I10 Essential (primary) hypertension: Secondary | ICD-10-CM | POA: Diagnosis not present

## 2019-02-25 DIAGNOSIS — E7800 Pure hypercholesterolemia, unspecified: Secondary | ICD-10-CM | POA: Diagnosis not present

## 2019-02-25 DIAGNOSIS — R32 Unspecified urinary incontinence: Secondary | ICD-10-CM | POA: Diagnosis not present

## 2019-02-25 DIAGNOSIS — J309 Allergic rhinitis, unspecified: Secondary | ICD-10-CM | POA: Diagnosis not present

## 2019-02-25 DIAGNOSIS — K5900 Constipation, unspecified: Secondary | ICD-10-CM | POA: Diagnosis not present

## 2019-02-25 DIAGNOSIS — F332 Major depressive disorder, recurrent severe without psychotic features: Secondary | ICD-10-CM | POA: Diagnosis not present

## 2019-02-25 DIAGNOSIS — S93409D Sprain of unspecified ligament of unspecified ankle, subsequent encounter: Secondary | ICD-10-CM | POA: Diagnosis not present

## 2019-02-25 DIAGNOSIS — Z1389 Encounter for screening for other disorder: Secondary | ICD-10-CM | POA: Diagnosis not present

## 2019-02-25 DIAGNOSIS — G629 Polyneuropathy, unspecified: Secondary | ICD-10-CM | POA: Diagnosis not present

## 2019-02-25 DIAGNOSIS — Z0000 Encounter for general adult medical examination without abnormal findings: Secondary | ICD-10-CM | POA: Diagnosis not present

## 2019-02-25 DIAGNOSIS — M545 Low back pain: Secondary | ICD-10-CM | POA: Diagnosis not present

## 2019-02-25 DIAGNOSIS — R490 Dysphonia: Secondary | ICD-10-CM | POA: Diagnosis not present

## 2019-03-03 ENCOUNTER — Encounter: Payer: Self-pay | Admitting: Neurology

## 2019-03-03 NOTE — Procedures (Signed)
Patient Information     First Name: Amy [name]. Last Name: Amy [name] ID: [id number]  Birth Date: 08-25-1946 Age: 73 Gender: Female  Referring Provider: Sarina Ill, MD  BMI: 47.8 (W=286 lb, H=5' 5'')  Neck Circ.:  18 '' Epworth:  5/24   Sleep Study Information    Study Date: Feb 17, 2019 S/H/A Version: [id number] / 4.1.1528 / 45  Dr Kelton Pillar referred this patient with headaches, morbid obesity and untreated sleep apnea to Dr. Jaynee Eagles.   This 73 year old Patient has had a PSG at the [facility] location, about 12 years ago and was diagnosed with OSA- but could not tolerate the prescribed CPAP at the time: "It almost killed me "She returned the device after 2-3 weeks. She felt suffocated. Sleep and medical history: small bowel obstruction, PVD, GERD, Neuropathy, painful- feet and legs.  Asthma, DDD, headaches, glaucoma, Iron def. anemia. Now to be evaluated if OSA contributes to HA and EDS.         Diagnosis:     Moderate degree of obstructive Sleep apnea was found, with an AHI of 24.5 /h and not associated with higher AHI in REM sleep (AHI 21).  There is an exacerbation noted while sleeping supine.  No snoring indicated by RDI. No hypoxia found, which was most surprising.  Tachy-brady cardia was evident.     Recommendations:      1) The patient may chose treatment with a dental device given her apprehension about CPAP therapy.  2) The patient would likely benefit from CPAP if she allows for a mask fitting and auto-settings on the device.     3) Weight loss is urgently recommended.     Electronically Signed: Larey Seat, MD   03-03-2019            Sleep Summary  Oxygen Saturation Statistics   Start Study Time: End Study Time: Total Recording Time:  11:58:50 PM          8:34:21 AM   8 h, 35 min  Total Sleep Time % REM of Sleep Time:  7 h, 21 min  24.1    Mean: 95 Minimum: 88 Maximum: 99  Mean of Desaturations Nadirs (%):   93  Oxygen Desaturation %: 4-9  10-20 >20 Total  Events Number Total  61 100.0  0 0.0  0 0.0  61 100.0  Oxygen Saturation: <90 <=88 <85 <80 <70  Duration (minutes): Sleep % 0.1 0.0 0.0 0.0 0.0 0.0 0.0 0.0 0.0 0.0     Respiratory Indices      Total Events REM NREM All Night  pRDI:  179  pAHI:  179 ODI:  61  pAHIc:  29  % CSR: 0.0 21.0 21.0 8.0 8.0 25.6 25.6 8.5 2.7 24.5 24.5 8.4 4.0       Pulse Rate Statistics during Sleep (BPM)      Mean:  68 Minimum: 38 Maximum: 147    Indices are calculated using technically valid sleep time of  7 hrs, 18 min. pRDI/pAHI are calculated using oxi desaturations ? 3%    Body Position Statistics  Position Supine Prone Right Left Non-Supine  Sleep (min) 338.5 48.0 0.0 55.0 103.0  Sleep % 76.7 10.9 0.0 12.5 23.3  pRDI 23.3 31.3 N/A 26.3 28.6  pAHI 23.3 31.3 N/A 26.3 28.6  ODI 9.0 6.3 N/A 6.6 6.4     Snoring Statistics Snoring Level (dB) >40 >50 >60 >70 >80 >Threshold (45)  Sleep (min) 99.5 10.1 3.4  0.0 0.0 21.9  Sleep % 22.5 2.3 0.8 0.0 0.0 5.0    Mean: 41 dB Sleep Stages Chart                                      pAHI=24.5                                                  Mild              Moderate                    Severe                                                 5              15                    30

## 2019-03-04 ENCOUNTER — Encounter: Payer: Self-pay | Admitting: Neurology

## 2019-03-04 ENCOUNTER — Telehealth: Payer: Self-pay | Admitting: Neurology

## 2019-03-04 DIAGNOSIS — R0683 Snoring: Secondary | ICD-10-CM

## 2019-03-04 DIAGNOSIS — G4733 Obstructive sleep apnea (adult) (pediatric): Secondary | ICD-10-CM

## 2019-03-04 DIAGNOSIS — R400 Somnolence: Secondary | ICD-10-CM

## 2019-03-04 NOTE — Telephone Encounter (Signed)
-----   Message from Larey Seat, MD sent at 03/03/2019  5:32 PM EDT ----- Moderate degree of obstructive Sleep apnea was found, with an AHI of 24.5 /h and not associated with higher AHI in REM sleep (AHI 21).  There is an exacerbation noted while sleeping supine.  No snoring indicated by RDI. No hypoxia found, which was most surprising.  Tachy-brady cardia was evident.     Recommendations:     1) The patient may chose treatment with a dental device given her apprehension about CPAP therapy.              2) The patient would likely benefit from CPAP if she allows for a mask fitting and tries for auto-settings on the device.     3) Weight loss is urgently recommended.     Electronically Signed: Larey Seat, MD   03-03-2019   Myriam Jacobson , please inquire about  patient's preferences. Thank you .

## 2019-03-04 NOTE — Telephone Encounter (Signed)
I called pt. I advised pt that Dr. Brett Fairy reviewed their sleep study results and found that pt has moderate sleep apnea. Dr. Brett Fairy recommends that pt start auto CPAP. I reviewed PAP compliance expectations with the pt. Pt is agreeable to starting a CPAP. I advised pt that an order will be sent to a DME, [facility], and [facility] will call the pt within about one week after they file with the pt's insurance. [facility] will show the pt how to use the machine, fit for masks, and troubleshoot the CPAP if needed. A follow up appt was made for insurance purposes with Ward Givens, NP on Aug 6,2020 at 10:30 am. Pt verbalized understanding to arrive 15 minutes early and bring their CPAP. A letter with all of this information in it will be mailed to the pt as a reminder. I verified with the pt that the address we have on file is correct. Pt verbalized understanding of results. Pt had no questions at this time but was encouraged to call back if questions arise. I have sent the order to [facility] and have received confirmation that they have received the order.

## 2019-03-04 NOTE — Telephone Encounter (Signed)
Called patient to discuss sleep study results. No answer at this time. LVM for the patient to call back.   

## 2019-03-04 NOTE — Addendum Note (Signed)
Addended by: Darleen Crocker on: 03/04/2019 10:47 AM   Modules accepted: Orders

## 2019-03-04 NOTE — Telephone Encounter (Signed)
Pt has returned the call to RN Casey, she is asking for a call back °

## 2019-03-17 ENCOUNTER — Other Ambulatory Visit: Payer: Self-pay

## 2019-03-17 ENCOUNTER — Ambulatory Visit (INDEPENDENT_AMBULATORY_CARE_PROVIDER_SITE_OTHER): Payer: Medicare Other | Admitting: Sports Medicine

## 2019-03-17 ENCOUNTER — Other Ambulatory Visit: Payer: Self-pay | Admitting: Sports Medicine

## 2019-03-17 ENCOUNTER — Ambulatory Visit (INDEPENDENT_AMBULATORY_CARE_PROVIDER_SITE_OTHER): Payer: Medicare Other

## 2019-03-17 ENCOUNTER — Telehealth: Payer: Self-pay | Admitting: *Deleted

## 2019-03-17 ENCOUNTER — Encounter: Payer: Self-pay | Admitting: Sports Medicine

## 2019-03-17 VITALS — Temp 97.4°F

## 2019-03-17 DIAGNOSIS — M25571 Pain in right ankle and joints of right foot: Secondary | ICD-10-CM

## 2019-03-17 DIAGNOSIS — M25371 Other instability, right ankle: Secondary | ICD-10-CM

## 2019-03-17 DIAGNOSIS — S93431A Sprain of tibiofibular ligament of right ankle, initial encounter: Secondary | ICD-10-CM

## 2019-03-17 DIAGNOSIS — S93491A Sprain of other ligament of right ankle, initial encounter: Secondary | ICD-10-CM

## 2019-03-17 DIAGNOSIS — S96919A Strain of unspecified muscle and tendon at ankle and foot level, unspecified foot, initial encounter: Secondary | ICD-10-CM | POA: Diagnosis not present

## 2019-03-17 DIAGNOSIS — M7662 Achilles tendinitis, left leg: Secondary | ICD-10-CM | POA: Diagnosis not present

## 2019-03-17 DIAGNOSIS — M79672 Pain in left foot: Secondary | ICD-10-CM | POA: Diagnosis not present

## 2019-03-17 DIAGNOSIS — M79671 Pain in right foot: Secondary | ICD-10-CM | POA: Diagnosis not present

## 2019-03-17 MED ORDER — PREDNISONE 10 MG (21) PO TBPK: ORAL_TABLET | ORAL | 21 refills | Status: DC

## 2019-03-17 NOTE — Progress Notes (Signed)
Subjective:  Amy [name] is a 73 y.o. female patient who presents to office for evaluation of right ankle pain. Patient complains of continued pain in the ankle after falling out of the bed 3 weeks ago. Patient has tried CAM boot with no relief in symptoms after ER gave it to her. Patient reports that she is also concerned with the swelling at her ankle and discoloration. Pain at right ankle is intense and can not walk without boot and cane it feels like her ankle is turning. Patient reports that at her left heel the pain has flared up again as well. Patient denies any other pedal complaints.  Patient Active Problem List   Diagnosis Date Noted  . Snoring 01/22/2019  . Morbid obesity with body mass index of 45.0-49.9 in adult [facility]) 01/22/2019  . OSA (obstructive sleep apnea) 01/22/2019  . Wound dehiscence 07/22/2016  . Dyspnea 07/21/2016  . Acute encephalopathy 07/21/2016  . Anemia 07/21/2016  . Pancreatitis 07/02/2016  . SBO (small bowel obstruction) ([place]) 07/02/2016  . Long term current use of opiate analgesic 06/26/2016  . Long term prescription opiate use 06/26/2016  . Opiate use 06/26/2016  . Encounter for therapeutic drug level monitoring 06/26/2016  . Encounter for pain management planning 06/26/2016  . Chronic pain 06/26/2016  . Disturbance of skin sensation 06/26/2016  . Chronic low back pain (Location of Primary Source of Pain) (Bilateral) (R>L) 06/26/2016  . Chronic hip pain (Location of Secondary source of pain) (Bilateral) (R>L) 06/26/2016  . 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 Chronic shoulder pain (Location of Tertiary source of pain) (Bilateral) (R>L) 06/26/2016  . 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 Chronic knee pain (Bilateral) (R>L) 06/26/2016  . 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 Chronic neck pain (Right) 06/26/2016  . Lumbar spondylosis 06/26/2016  . Lumbar facet syndrome 06/26/2016  .  Lumbar facet hypertrophy 06/26/2016  . 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 Neurogenic pain 06/26/2016  . Occipital headaches 06/26/2016  . 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 History of lumbar fusion 06/26/2016  . 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  . Neuropathic pain of both legs 10/13/2015  . Constipation 10/13/2015  . Complete rotator cuff tear of left shoulder 05/05/2014  . Depression 08/06/2013  . Hypokalemia 08/06/2013  . GERD (gastroesophageal reflux disease) 08/06/2013  . Spinal stenosis of lumbar region 08/06/2013  . Bladder spasm 08/06/2013  . Obesity, Class III, BMI 40-49.9 (morbid obesity) ([place]) 02/03/2013  . Lipoma of buttock s/p excision 02/20/2013 02/03/2013  . DYSPNEA 01/23/2008  . Obstructive sleep apnea 09/12/2007  . HTN (hypertension) 09/12/2007  . Allergic rhinitis 09/12/2007  . SLEEPINESS 09/12/2007  . ANGINA, HX OF 09/12/2007    Current Outpatient Medications on File Prior to Visit  Medication Sig Dispense Refill  . acetaminophen (TYLENOL) 500 MG tablet Take 1,000 mg by mouth every 6 (six) hours as needed for moderate pain.     Marland Kitchen azelaic acid (AZELEX) 20 % cream Apply 1 application topically 2 (two) times daily. After skin is thoroughly washed and patted dry, gently but thoroughly massage a thin film of azelaic acid cream into the affected area twice daily, in the morning and evening.    . B Complex Vitamins (B COMPLEX PO) Take by mouth.    . calcium-vitamin D (OSCAL WITH D) 500-200 MG-UNIT tablet Take 1 tablet by mouth daily with breakfast.    . cloNIDine (CATAPRES) 0.1 MG tablet Take  1 tablet (0.1 mg total) by mouth 3 (three) times daily. 60 tablet 11  . diclofenac sodium (VOLTAREN) 1 % GEL Apply 4 g topically 4 (four) times daily. 100 g 0  . diphenhydrAMINE (BENADRYL) 25 MG tablet Take 25 mg by mouth 2 (two) times daily as needed for allergies.    Marland Kitchen estradiol (ESTRACE) 0.1 MG/GM vaginal cream Place 1  Applicatorful vaginally 2 (two) times a week. (Patient taking differently: Place 1 Applicatorful vaginally 2 (two) times a week. Tuesday and Thursday) 42.5 g 12  . fluticasone (FLONASE) 50 MCG/ACT nasal spray Place 1 spray into both nostrils daily.  2  . gabapentin (NEURONTIN) 300 MG capsule Take 300 mg by mouth 2 (two) times daily.    . hydrALAZINE (APRESOLINE) 50 MG tablet Take 50 mg by mouth 3 (three) times daily.    Marland Kitchen losartan (COZAAR) 50 MG tablet Take 100 mg by mouth daily.    . meloxicam (MOBIC) 15 MG tablet Take 15 mg by mouth daily.    . Menthol-Methyl Salicylate (MUSCLE RUB EX) Apply 1 application topically 2 (two) times daily as needed (pain in neck and back).     . methocarbamol (ROBAXIN) 500 MG tablet Take 500 mg by mouth 2 (two) times daily as needed for muscle spasms.    . metoprolol (LOPRESSOR) 50 MG tablet Take 1 tablet (50 mg total) by mouth 2 (two) times daily.    Marland Kitchen NIFEdipine (PROCARDIA) 10 MG capsule Take 10 mg by mouth 3 (three) times daily.    Marland Kitchen OVER THE COUNTER MEDICATION Take 2 tablets by mouth daily. Swiss Kriss OTC herbal laxative    . pantoprazole (PROTONIX) 40 MG tablet Take 80 mg by mouth daily.   0  . PARoxetine (PAXIL) 30 MG tablet Take 30 mg by mouth daily.    . potassium chloride SA (K-DUR,KLOR-CON) 20 MEQ tablet Take 20 mEq by mouth daily.    . tapentadol (NUCYNTA) 50 MG tablet Take 50 mg by mouth every 6 (six) hours as needed for moderate pain.    . vitamin B-12 (CYANOCOBALAMIN) 1000 MCG tablet Take 1,000 mcg by mouth daily.    . vitamin E (VITAMIN E) 400 UNIT capsule Take 400 Units by mouth daily.    . ondansetron (ZOFRAN-ODT) 4 MG disintegrating tablet DIS 1 T ON THE TONGUE TID PRN     No current facility-administered medications on file prior to visit.     Allergies  Allergen Reactions  . Latex Other (See Comments)    Sores, blisters - reaction to gloves  . Crestor [Rosuvastatin Calcium]     Myalgias  . Penicillins Other (See Comments)     UNSPECIFIED REACTION  "Has taken amoxicillin and ampicillin with no reaction" Has patient had a PCN reaction causing immediate rash, facial/tongue/throat swelling, SOB or lightheadedness with hypotension: Unknown Has patient had a PCN reaction causing severe rash involving mucus membranes or skin necrosis: Unknown Has patient had a PCN reaction that required hospitalization: Unknown Has patient had a PCN reaction occurring within the last 10 years: No   . Tetanus Toxoids Rash  . Tomato Rash    Objective:  General: Alert and oriented x3 in no acute distress  Dermatology: No open lesions bilateral lower extremities, no webspace macerations, no ecchymosis bilateral, all nails x 10 are well manicured.  Vascular: Dorsalis Pedis and Posterior Tibial pedal pulses palpable, Capillary Fill Time 3 seconds,(+) pedal hair growth bilateral, trace edema bilateral lower extremities with R>L, Temperature gradient within normal limits. No  acute ischemia.   Neurology: Johney Maine sensation intact via light touch bilateral.   Musculoskeletal: Moderate tenderness with palpation at right ankle at lateral ankle ligaments that radiates up the leg. Negative talar tilt, + tib-fib stress, + instability. Pain with palpation to the achilles on left. No pain with calf compression bilateral. Range of motion within normal limits with mild guarding on Right>left ankle.   Gait: Antalgic gait  Xrays  Right Ankle   Impression:soft tissue swelling. No fracture.   Assessment and Plan: Problem List Items Addressed This Visit    None    Visit Diagnoses    Pain in right foot    -  Primary   High ankle sprain, right, initial encounter       Right ankle instability       Acute right ankle pain       Left foot pain       Tendonitis, Achilles, left       Tendon tear, ankle, initial encounter         -Complete examination performed -Xrays reviewed -Discussed treatement options for ankle sprain on right and achilles  tendonitis flare on left -Applied unna boot to right to keep intact for 5 days as instructed  -Rx MRI to r/o tear since history of fall -Rx Prednisone to help with pain and inflammation -Continue with rest, ice, elevation and CAM boot on right and heel lift on left as dispensed  -Patient to return to office after MRI or sooner if condition worsens.  Landis Martins, DPM

## 2019-03-17 NOTE — Telephone Encounter (Signed)
Orders to J. Quintana, RN for pre-cert, faxed to [facility]. 

## 2019-03-17 NOTE — Patient Instructions (Signed)
.  tfc  

## 2019-03-17 NOTE — Telephone Encounter (Signed)
-----   Message from Landis Martins, [place] sent at 03/17/2019  4:12 PM EDT ----- Regarding: MRI R ankle Eval for ligament tear at ankle

## 2019-03-24 ENCOUNTER — Other Ambulatory Visit: Payer: Self-pay | Admitting: Neurosurgery

## 2019-03-24 ENCOUNTER — Telehealth: Payer: Self-pay | Admitting: Neurology

## 2019-03-24 DIAGNOSIS — M542 Cervicalgia: Secondary | ICD-10-CM

## 2019-03-24 DIAGNOSIS — G4733 Obstructive sleep apnea (adult) (pediatric): Secondary | ICD-10-CM

## 2019-03-24 DIAGNOSIS — R0683 Snoring: Secondary | ICD-10-CM

## 2019-03-24 DIAGNOSIS — R400 Somnolence: Secondary | ICD-10-CM

## 2019-03-24 NOTE — Telephone Encounter (Signed)
"  this one is on hold due to the wording in Dr. Buckner [place] notes that stat that the pt. failed compliance on their pap 10 yrs ago. because of this the pt. will have to redo the Sleep Study in Lab because of their [place]. Please let me know when you get this one scheduled so I can follow them and make sure to process it correctly."  Received a notification that due to the patient non compliance in the past medicare is requiring the patient complete a in lab study to get set up on auto CPAP. They will not cover by using the HST. I will place the order for the patient.

## 2019-03-30 ENCOUNTER — Emergency Department (HOSPITAL_COMMUNITY): Payer: Medicare Other

## 2019-03-30 ENCOUNTER — Encounter (HOSPITAL_COMMUNITY): Payer: Self-pay

## 2019-03-30 ENCOUNTER — Other Ambulatory Visit: Payer: Self-pay

## 2019-03-30 ENCOUNTER — Telehealth: Payer: Self-pay

## 2019-03-30 ENCOUNTER — Emergency Department (HOSPITAL_COMMUNITY)
Admission: EM | Admit: 2019-03-30 | Discharge: 2019-03-31 | Disposition: A | Discharge status: Home / Self Care | Payer: Medicare Other | Attending: Emergency Medicine | Admitting: Emergency Medicine

## 2019-03-30 DIAGNOSIS — R0602 Shortness of breath: Secondary | ICD-10-CM | POA: Diagnosis not present

## 2019-03-30 DIAGNOSIS — I10 Essential (primary) hypertension: Secondary | ICD-10-CM | POA: Diagnosis not present

## 2019-03-30 DIAGNOSIS — J45909 Unspecified asthma, uncomplicated: Secondary | ICD-10-CM | POA: Diagnosis not present

## 2019-03-30 DIAGNOSIS — N3000 Acute cystitis without hematuria: Secondary | ICD-10-CM

## 2019-03-30 DIAGNOSIS — Z87891 Personal history of nicotine dependence: Secondary | ICD-10-CM | POA: Diagnosis not present

## 2019-03-30 DIAGNOSIS — M79661 Pain in right lower leg: Secondary | ICD-10-CM | POA: Diagnosis not present

## 2019-03-30 DIAGNOSIS — Z91040 Latex allergy status: Secondary | ICD-10-CM | POA: Insufficient documentation

## 2019-03-30 DIAGNOSIS — R072 Precordial pain: Secondary | ICD-10-CM | POA: Diagnosis not present

## 2019-03-30 DIAGNOSIS — J029 Acute pharyngitis, unspecified: Secondary | ICD-10-CM | POA: Diagnosis not present

## 2019-03-30 DIAGNOSIS — Z79899 Other long term (current) drug therapy: Secondary | ICD-10-CM | POA: Insufficient documentation

## 2019-03-30 DIAGNOSIS — R531 Weakness: Secondary | ICD-10-CM | POA: Diagnosis not present

## 2019-03-30 DIAGNOSIS — R079 Chest pain, unspecified: Secondary | ICD-10-CM | POA: Diagnosis not present

## 2019-03-30 LAB — CBC
HCT: 42.5 % (ref 36.0–46.0)
Hemoglobin: 13.7 g/dL (ref 12.0–15.0)
MCH: 30.1 pg (ref 26.0–34.0)
MCHC: 32.2 g/dL (ref 30.0–36.0)
MCV: 93.4 fL (ref 80.0–100.0)
Platelets: 287 10*3/uL (ref 150–400)
RBC: 4.55 MIL/uL (ref 3.87–5.11)
RDW: 14.9 % (ref 11.5–15.5)
WBC: 6.6 10*3/uL (ref 4.0–10.5)
nRBC: 0 % (ref 0.0–0.2)

## 2019-03-30 LAB — BASIC METABOLIC PANEL
Anion gap: 8 (ref 5–15)
BUN: 13 mg/dL (ref 8–23)
CO2: 26 mmol/L (ref 22–32)
Calcium: 8.9 mg/dL (ref 8.9–10.3)
Chloride: 102 mmol/L (ref 98–111)
Creatinine, Ser: 1.14 mg/dL — ABNORMAL HIGH (ref 0.44–1.00)
GFR calc Af Amer: 55 mL/min — ABNORMAL LOW (ref >60)
GFR calc non Af Amer: 48 mL/min — ABNORMAL LOW (ref >60)
Glucose, Bld: 86 mg/dL (ref 70–99)
Potassium: 4.3 mmol/L (ref 3.5–5.1)
Sodium: 136 mmol/L (ref 135–145)

## 2019-03-30 LAB — TROPONIN I (HIGH SENSITIVITY): Troponin I (High Sensitivity): 3 ng/L (ref <18)

## 2019-03-30 NOTE — Telephone Encounter (Signed)
Pt is schedule for sleep study on 04/23/2019. Pt would like earlier appt , if available. Have pt on waitlist.

## 2019-03-30 NOTE — ED Triage Notes (Signed)
Pt arrives POV for eval CP/SOB x 2 week. States onset of general malaise, sore throat andheadache this morning. Also reports blt hand numbness onset 8 months, intermittent.

## 2019-03-31 ENCOUNTER — Emergency Department (HOSPITAL_COMMUNITY): Payer: Medicare Other

## 2019-03-31 ENCOUNTER — Encounter (HOSPITAL_COMMUNITY): Payer: Self-pay | Admitting: Radiology

## 2019-03-31 DIAGNOSIS — R0602 Shortness of breath: Secondary | ICD-10-CM | POA: Diagnosis not present

## 2019-03-31 DIAGNOSIS — N3000 Acute cystitis without hematuria: Secondary | ICD-10-CM | POA: Diagnosis not present

## 2019-03-31 LAB — URINALYSIS, ROUTINE W REFLEX MICROSCOPIC
Bilirubin Urine: NEGATIVE
Glucose, UA: NEGATIVE mg/dL
Hgb urine dipstick: NEGATIVE
Ketones, ur: NEGATIVE mg/dL
Nitrite: NEGATIVE
Protein, ur: NEGATIVE mg/dL
Specific Gravity, Urine: 1.013 (ref 1.005–1.030)
pH: 6 (ref 5.0–8.0)

## 2019-03-31 LAB — D-DIMER, QUANTITATIVE: D-Dimer, Quant: 3.27 ug/mL-FEU — ABNORMAL HIGH (ref 0.00–0.50)

## 2019-03-31 MED ORDER — HYDRALAZINE HCL 25 MG PO TABS
50.0000 mg | ORAL_TABLET | Freq: Once | ORAL | Status: AC
  Administered 2019-03-31: 50 mg via ORAL
  Filled 2019-03-31: qty 2

## 2019-03-31 MED ORDER — SODIUM CHLORIDE 0.9 % IV SOLN
1.0000 g | Freq: Once | INTRAVENOUS | Status: AC
  Administered 2019-03-31: 1 g via INTRAVENOUS
  Filled 2019-03-31: qty 10

## 2019-03-31 MED ORDER — METOPROLOL TARTRATE 25 MG PO TABS
50.0000 mg | ORAL_TABLET | Freq: Once | ORAL | Status: AC
  Administered 2019-03-31: 03:00:00 50 mg via ORAL
  Filled 2019-03-31: qty 2

## 2019-03-31 MED ORDER — IOHEXOL 350 MG/ML SOLN
80.0000 mL | Freq: Once | INTRAVENOUS | Status: AC | PRN
  Administered 2019-03-31: 80 mL via INTRAVENOUS

## 2019-03-31 MED ORDER — CEPHALEXIN 500 MG PO CAPS: 500.0000 mg | ORAL_CAPSULE | Freq: Three times a day (TID) | ORAL | 0 refills | Status: DC

## 2019-03-31 MED ORDER — CLONIDINE HCL 0.1 MG PO TABS
0.1000 mg | ORAL_TABLET | Freq: Once | ORAL | Status: AC
  Administered 2019-03-31: 03:00:00 0.1 mg via ORAL
  Filled 2019-03-31: qty 1

## 2019-03-31 MED ORDER — SODIUM CHLORIDE 0.9 % IV BOLUS
500.0000 mL | Freq: Once | INTRAVENOUS | Status: AC
  Administered 2019-03-31: 500 mL via INTRAVENOUS

## 2019-03-31 NOTE — Discharge Instructions (Signed)
We saw in the ER for weakness. Our lab work-up here eventually showed that you have a UTI.  CT scan does not reveal any pneumonia or blood clots.  Take the antibiotics prescribed and follow-up with your primary care doctor in 1 week.

## 2019-03-31 NOTE — ED Provider Notes (Signed)
[facility] EMERGENCY DEPARTMENT Provider Note   CSN: [id number] Arrival date & time: 03/30/19  1809     History   Chief Complaint Chief Complaint  Patient presents with   Chest Pain    HPI Amy [name] is a 73 y.o. female.     HPI  73 year old female comes in a chief complaint of chest discomfort, shortness of breath for the past 1 week.  She is also complaining of associated weakness.  Patient's chest pain is located over the midsternal part of her chest.  Her pain has been constant for the last week.  There is no specific evoking, aggravating or relieving factors.  Pain is not worse with deep inspiration or exertion.  Patient however is feeling short of breath, particularly when she is exerting herself.  She is also having generalized weakness.  Review of system is negative for any UTI-like symptoms, new cough, nausea, vomiting, diarrhea.  Patient's bowel movements have been normal. patient does indicate that she is having some nonspecific abdominal discomfort and also worsening of her chronic back pain.   Patient has history of DVT in the past.  She also uses topical exogenous estrogen and had a recent ankle sprain that is limiting her ambulation.   Past Medical History:  Diagnosis Date   Abdominal pain of unknown etiology 02/05/2016   Anxiety    Arthritis    ddd- RA   Asthma    "sleeping asthma"   Chronic back pain    lumbar steroid injection's   Complication of anesthesia    Hard to wake up. Pt sts is took 3 days.   Constipation    Depression    Dysphonia    intermittent "voice changes"   Esophageal dysmotility    Fibromyalgia    Fibromyalgia    GERD (gastroesophageal reflux disease)    Glaucoma    Headache    High cholesterol    ? pt states her doctor told her to continue eating vegetables & fruit   History of DVT (deep vein thrombosis) 1989   LEFT UPPER ARM   History of hiatal hernia    History of MRSA  infection 2011   Hypertension    Irregular heart rate    "years ago"   Low iron    Lumbago    Mild obstructive sleep apnea    per study 02-07-2006 - no cpap   Neuropathy    feet   Pelvic pain in female    Peripheral vascular disease ([place])    "poor circulation"   SBO (small bowel obstruction) ([place]) 06/2016   Seasonal allergies    Short of breath on exertion    Urinary frequency    Vaginal atrophy    Weakness    both hands and feet    Patient Active Problem List   Diagnosis Date Noted   Snoring 01/22/2019   Morbid obesity with body mass index of 45.0-49.9 in adult [facility]) 01/22/2019   OSA (obstructive sleep apnea) 01/22/2019   Wound dehiscence 07/22/2016   Dyspnea 07/21/2016   Acute encephalopathy 07/21/2016   Anemia 07/21/2016   Pancreatitis 07/02/2016   SBO (small bowel obstruction) ([place]) 07/02/2016   Long term current use of opiate analgesic 06/26/2016   Long term prescription opiate use 06/26/2016   Opiate use 06/26/2016   Encounter for therapeutic drug level monitoring 06/26/2016   Encounter for pain management planning 06/26/2016   Chronic pain 06/26/2016   Disturbance of skin sensation 06/26/2016   Chronic  low back pain (Location of Primary Source of Pain) (Bilateral) (R>L) 06/26/2016   Chronic hip pain (Location of Secondary source of pain) (Bilateral) (R>L) 06/26/2016   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) 06/26/2016   Chronic shoulder pain (Location of Tertiary source of pain) (Bilateral) (R>L) 06/26/2016   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) 06/26/2016   Chronic knee pain (Bilateral) (R>L) 06/26/2016   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) 06/26/2016   Chronic neck pain (Right) 06/26/2016   Lumbar spondylosis 06/26/2016   Lumbar facet syndrome 06/26/2016   Lumbar facet hypertrophy 06/26/2016   Epidural fibrosis 06/26/2016   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   Neurogenic pain 06/26/2016    Occipital headaches 06/26/2016   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   History of lumbar fusion 06/26/2016   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   Neuropathic pain of both legs 10/13/2015   Constipation 10/13/2015   Complete rotator cuff tear of left shoulder 05/05/2014   Depression 08/06/2013   Hypokalemia 08/06/2013   GERD (gastroesophageal reflux disease) 08/06/2013   Spinal stenosis of lumbar region 08/06/2013   Bladder spasm 08/06/2013   Obesity, Class III, BMI 40-49.9 (morbid obesity) ([place]) 02/03/2013   Lipoma of buttock s/p excision 02/20/2013 02/03/2013   DYSPNEA 01/23/2008   Obstructive sleep apnea 09/12/2007   HTN (hypertension) 09/12/2007   Allergic rhinitis 09/12/2007   SLEEPINESS 09/12/2007   ANGINA, HX OF 09/12/2007    Past Surgical History:  Procedure Laterality Date   abdominal adhesions removed     ABDOMINAL HYSTERECTOMY  1978   BACK SURGERY     BUNIONECTOMY Left 2008   CATARACT EXTRACTION Bilateral 2017   COLON SURGERY     COLONOSCOPY     CYSTO WITH HYDRODISTENSION N/A 07/12/2015   Procedure: CYSTOSCOPY/HYDRODISTENSION;  Surgeon: Bjorn Loser, MD;  Location: [facility];  Service: Urology;  Laterality: N/A;   DILATION AND CURETTAGE OF UTERUS     ESOPHAGEAL MANOMETRY N/A 11/22/2014   Procedure: ESOPHAGEAL MANOMETRY (EM);  Surgeon: Winfield Cunas., MD;  Location: [facility];  Service: Endoscopy;  Laterality: N/A;   ESOPHAGOGASTRODUODENOSCOPY N/A 07/15/2013   Procedure: ESOPHAGOGASTRODUODENOSCOPY (EGD);  Surgeon: Winfield Cunas., MD;  Location: [facility];  Service: Endoscopy;  Laterality: N/A;  need xray   EXPLORATORY LAPAROTOMY     EYE SURGERY Bilateral    cataract surgery with lens implants   HARDWARE REMOVAL N/A 01/02/2016   Procedure: Exploration of Lumbar Fusion,Removal of hardware Lumbar One-Two ;Redo Posterior Lumbar Fusion Lumbar One-Two;  Surgeon: Kary Kos, MD;  Location:  [facility];  Service: Neurosurgery;  Laterality: N/A;   ingrown toenail removal     LAPAROSCOPIC CHOLECYSTECTOMY  01-11-2006   LAPAROSCOPY N/A 07/06/2016   Procedure: LAPAROSCOPY DIAGNOSTIC EMERGENT TO OPEN;  Surgeon: Greer Pickerel, MD;  Location: [facility];  Service: General;  Laterality: N/A;   LAPAROTOMY N/A 07/06/2016   Procedure: EXPLORATORY LAPAROTOMY LYSIS OF ADHESIONS FOR 3 HOURS;  Surgeon: Greer Pickerel, MD;  Location: [facility];  Service: General;  Laterality: N/A;   lipoma removal     LUMBAR FUSION  2014   L4 -- L5   MASS EXCISION Left 02/20/2013   Procedure: EXCISION LEFT BUTTOCK  MASS;  Surgeon: Adin Hector, MD;  Location: [facility];  Service: General;  Laterality: Left;   NOSE SURGERY  2007   REMOVAL HARDWARE L4-L5/  BILATERAL LAMINECTOMY L2 - L5 AND FUSION  06-12-2011   SHOULDER OPEN ROTATOR CUFF REPAIR Left 05/05/2014   Procedure: OPEN ACROMIONECTOMY  AND OPEN REPAIR OF ROTATOR CUFF, TISSUEMEND GRAFT WITH ANCHOR ;  Surgeon: Tobi Bastos, MD;  Location: [facility];  Service: Orthopedics;  Laterality: Left;   SPINE SURGERY     x6   TONSILLECTOMY       OB History   No obstetric history on file.      Home Medications    Prior to Admission medications   Medication Sig Start Date End Date Taking? Authorizing Provider  acetaminophen (TYLENOL) 500 MG tablet Take 1,000 mg by mouth every 6 (six) hours as needed for moderate pain.     [provider]  azelaic acid (AZELEX) 20 % cream Apply 1 application topically 2 (two) times daily. After skin is thoroughly washed and patted dry, gently but thoroughly massage a thin film of azelaic acid cream into the affected area twice daily, in the morning and evening.    [provider]  B Complex Vitamins (B COMPLEX PO) Take by mouth.    [provider]  calcium-vitamin D (OSCAL WITH D) 500-200 MG-UNIT tablet Take 1 tablet by mouth daily with breakfast. 08/02/16   Meuth, Jerene Pitch A, PA-C  cloNIDine (CATAPRES) 0.1  MG tablet Take 1 tablet (0.1 mg total) by mouth 3 (three) times daily. 08/01/16   Meuth, Brooke A, PA-C  diclofenac sodium (VOLTAREN) 1 % GEL Apply 4 g topically 4 (four) times daily. 02/16/19   Deno Etienne, DO  diphenhydrAMINE (BENADRYL) 25 MG tablet Take 25 mg by mouth 2 (two) times daily as needed for allergies.    [provider]  estradiol (ESTRACE) 0.1 MG/GM vaginal cream Place 1 Applicatorful vaginally 2 (two) times a week. Patient taking differently: Place 1 Applicatorful vaginally 2 (two) times a week. Tuesday and Thursday 08/02/16   Margie Billet A, PA-C  fluticasone (FLONASE) 50 MCG/ACT nasal spray Place 1 spray into both nostrils daily. 08/02/16   Meuth, Brooke A, PA-C  gabapentin (NEURONTIN) 300 MG capsule Take 300 mg by mouth 2 (two) times daily.    [provider]  hydrALAZINE (APRESOLINE) 50 MG tablet Take 50 mg by mouth 3 (three) times daily.    [provider]  losartan (COZAAR) 50 MG tablet Take 100 mg by mouth daily.    [provider]  meloxicam (MOBIC) 15 MG tablet Take 15 mg by mouth daily.    [provider]  Menthol-Methyl Salicylate (MUSCLE RUB EX) Apply 1 application topically 2 (two) times daily as needed (pain in neck and back).     [provider]  methocarbamol (ROBAXIN) 500 MG tablet Take 500 mg by mouth 2 (two) times daily as needed for muscle spasms.    [provider]  metoprolol (LOPRESSOR) 50 MG tablet Take 1 tablet (50 mg total) by mouth 2 (two) times daily. 08/01/16   Meuth, Brooke A, PA-C  NIFEdipine (PROCARDIA) 10 MG capsule Take 10 mg by mouth 3 (three) times daily.    [provider]  ondansetron (ZOFRAN-ODT) 4 MG disintegrating tablet DIS 1 T ON THE TONGUE TID PRN 03/04/19   [provider]  OVER THE COUNTER MEDICATION Take 2 tablets by mouth daily. Swiss Kriss OTC herbal laxative    [provider]  pantoprazole (PROTONIX) 40 MG tablet Take 80 mg by mouth daily.  01/17/18    [provider]  PARoxetine (PAXIL) 30 MG tablet Take 30 mg by mouth daily.    [provider]  potassium chloride SA (K-DUR,KLOR-CON) 20 MEQ tablet Take 20 mEq by mouth  daily.    [provider]  predniSONE (STERAPRED UNI-PAK 21 TAB) 10 MG (21) TBPK tablet Take as directed 03/17/19   Landis Martins, DPM  tapentadol (NUCYNTA) 50 MG tablet Take 50 mg by mouth every 6 (six) hours as needed for moderate pain.    [provider]  vitamin B-12 (CYANOCOBALAMIN) 1000 MCG tablet Take 1,000 mcg by mouth daily.    [provider]  vitamin E (VITAMIN E) 400 UNIT capsule Take 400 Units by mouth daily.    [provider]    Family History Family History  Problem Relation Age of Onset   Hypertension Mother    Cancer Mother        cervix   Kidney disease Mother    Osteoarthritis Mother    Diabetes Mother    Hypertension Sister    Cancer Sister        polyps   Kidney disease Brother    Cancer Daughter        leukemia   Hypertension Brother    Liver cancer Brother    Hypertension Father    Colon cancer Father    Colon polyps Father    Hypertension Brother    Liver cancer Brother    Aneurysm Maternal Grandmother    Migraines Neg Hx    Headache Neg Hx     Social History Social History   Tobacco Use   Smoking status: Former Smoker    Quit date: 07/10/1969    Years since quitting: 49.7   Smokeless tobacco: Never Used  Substance Use Topics   Alcohol use: Not Currently    Comment: rare, social   Drug use: No     Allergies   Latex, Crestor [rosuvastatin calcium], Penicillins, Tetanus toxoids, and Tomato   Review of Systems Review of Systems  Constitutional: Positive for activity change.  Respiratory: Positive for cough and shortness of breath.   Cardiovascular: Positive for chest pain.  Gastrointestinal: Negative for nausea and vomiting.  Genitourinary: Negative for dysuria.  Musculoskeletal: Positive  for back pain.  Skin: Negative for rash.  Allergic/Immunologic: Negative for immunocompromised state.  Neurological: Positive for dizziness.  All other systems reviewed and are negative.    Physical Exam Updated Vital Signs BP (!) 182/86    Pulse 64    Temp 98.2 F (36.8 C) (Oral)    Resp 10    Ht 5\' 5"  (1.651 m)    Wt 127 kg    SpO2 98%    BMI 46.59 kg/m   Physical Exam Vitals signs and nursing note reviewed.  Constitutional:      Appearance: She is well-developed.  HENT:     Head: Normocephalic and atraumatic.  Eyes:     Pupils: Pupils are equal, round, and reactive to light.     Comments: No nystagmus  Neck:     Musculoskeletal: Neck supple.  Cardiovascular:     Rate and Rhythm: Normal rate and regular rhythm.     Heart sounds: Normal heart sounds.  Pulmonary:     Effort: Pulmonary effort is normal. No respiratory distress.  Abdominal:     General: There is no distension.     Palpations: Abdomen is soft.     Tenderness: There is no abdominal tenderness. There is no guarding or rebound.  Musculoskeletal:     Right lower leg: She exhibits tenderness. No edema.     Left lower leg: No edema.  Skin:    General: Skin is warm and dry.  Neurological:     General: No focal deficit present.     Mental Status: She is alert and oriented to person, place, and time.     Cranial Nerves: No cranial nerve deficit.     Motor: No weakness.      ED Treatments / Results  Labs (all labs ordered are listed, but only abnormal results are displayed) Labs Reviewed  BASIC METABOLIC PANEL - Abnormal; Notable for the following components:      Result Value   Creatinine, Ser 1.14 (*)    GFR calc non Af Amer 48 (*)    GFR calc Af Amer 55 (*)    All other components within normal limits  D-DIMER, QUANTITATIVE (NOT AT [facility]) - Abnormal; Notable for the following components:   D-Dimer, Quant 3.27 (*)    All other components within normal limits  CBC  TROPONIN I (HIGH SENSITIVITY)    TROPONIN I (HIGH SENSITIVITY)  URINALYSIS, ROUTINE W REFLEX MICROSCOPIC    EKG EKG Interpretation  Date/Time:  Monday March 30 2019 18:32:22 EDT Ventricular Rate:  74 PR Interval:  162 QRS Duration: 78 QT Interval:  384 QTC Calculation: 426 R Axis:   -25 Text Interpretation:  Normal sinus rhythm Possible Lateral infarct , age undetermined Abnormal ECG [id number] is new Confirmed by Varney Biles [phone]) on 03/31/2019 2:07:57 AM   Radiology Dg Chest 2 View  Result Date: 03/30/2019 CLINICAL DATA:  Chest pain, shortness of breath, general malaise EXAM: CHEST - 2 VIEW COMPARISON:  04/05/2018 FINDINGS: The heart size and mediastinal contours are within normal limits. Both lungs are clear. Partially imaged thoracolumbar fusion. IMPRESSION: No acute abnormality of the lungs. Electronically Signed   By: Eddie Candle M.D.   On: 03/30/2019 19:08    Procedures Angiocath insertion  Date/Time: 03/31/2019 4:22 AM Performed by: Varney Biles, MD Authorized by: Varney Biles, MD  Consent: Verbal consent obtained. Risks and benefits: risks, benefits and alternatives were discussed Consent given by: patient Patient understanding: patient states understanding of the procedure being performed Patient identity confirmed: arm band Time out: Immediately prior to procedure a "time out" was called to verify the correct patient, procedure, equipment, support staff and site/side marked as required. Preparation: Patient was prepped and draped in the usual sterile fashion. Local anesthesia used: no  Anesthesia: Local anesthesia used: no  Sedation: Patient sedated: no  Patient tolerance: patient tolerated the procedure well with no immediate complications Comments: Ultrasound guided left antecubital IV established.  20-gauge.    (including critical care time)  Medications Ordered in ED Medications  sodium chloride 0.9 % bolus 500 mL (0 mLs Intravenous Hold 03/31/19 0321)  cloNIDine (CATAPRES)  tablet 0.1 mg (0.1 mg Oral Given 03/31/19 0242)  hydrALAZINE (APRESOLINE) tablet 50 mg (50 mg Oral Given 03/31/19 0242)  metoprolol tartrate (LOPRESSOR) tablet 50 mg (50 mg Oral Given 03/31/19 0242)     Initial Impression / Assessment and Plan / ED Course  I have reviewed the triage vital signs and the nursing notes.  Pertinent labs & imaging results that were available during my care of the patient were reviewed by me and considered in my medical decision making (see chart for details).        73 year old female comes in a chief complaint of chest pain, shortness of breath.  Is also having generalized weakness with nonspecific sore throat.  She has not been in and out of the house and lives with her husband is also staying put.  Review of system  is negative for any cough, nausea, vomiting, UTI-like symptoms.  She has had history of DVT in the past and is taking exogenous estrogen and also had recent sprain to her ankle.  Symptoms of chest pain and shortness of breath are not consistent with ACS.  Differential diagnosis includes PE, ACS, UTI, electrolyte abnormalities.  Patient's neurologic exam is completely nonfocal and she does not have any symptoms that are concerning for stroke.  Plan is to get a d-dimer along with a UA added to her initial lab work-up.  The initial lab work-up does not reveal any acute abnormalities.  Her initial high-sensitivity troponin is only at 3, I do not see any reason to get a repeat as ACS is pretty much ruled out with single troponin in someone who has been having chest pain for days now.    Final Clinical Impressions(s) / ED Diagnoses   Final diagnoses:  None    ED Discharge Orders    None       Varney Biles, MD 03/31/19 [phone]

## 2019-03-31 NOTE — ED Notes (Signed)
Unable to start IV x2, MD made aware, second RN attempting.

## 2019-04-02 ENCOUNTER — Ambulatory Visit
Admission: RE | Admit: 2019-04-02 | Discharge: 2019-04-02 | Disposition: A | Discharge status: Home / Self Care | Payer: Medicare Other | Source: Ambulatory Visit | Attending: Sports Medicine | Admitting: Sports Medicine

## 2019-04-02 ENCOUNTER — Other Ambulatory Visit: Payer: Self-pay

## 2019-04-02 DIAGNOSIS — M25371 Other instability, right ankle: Secondary | ICD-10-CM

## 2019-04-02 DIAGNOSIS — S93491A Sprain of other ligament of right ankle, initial encounter: Secondary | ICD-10-CM

## 2019-04-02 DIAGNOSIS — M25571 Pain in right ankle and joints of right foot: Secondary | ICD-10-CM

## 2019-04-02 DIAGNOSIS — M25871 Other specified joint disorders, right ankle and foot: Secondary | ICD-10-CM | POA: Diagnosis not present

## 2019-04-05 ENCOUNTER — Ambulatory Visit (INDEPENDENT_AMBULATORY_CARE_PROVIDER_SITE_OTHER): Payer: Medicare Other | Admitting: Neurology

## 2019-04-05 DIAGNOSIS — R400 Somnolence: Secondary | ICD-10-CM

## 2019-04-05 DIAGNOSIS — G4733 Obstructive sleep apnea (adult) (pediatric): Secondary | ICD-10-CM

## 2019-04-05 DIAGNOSIS — R0683 Snoring: Secondary | ICD-10-CM

## 2019-04-06 ENCOUNTER — Other Ambulatory Visit: Payer: Self-pay

## 2019-04-09 DIAGNOSIS — M961 Postlaminectomy syndrome, not elsewhere classified: Secondary | ICD-10-CM | POA: Diagnosis not present

## 2019-04-09 DIAGNOSIS — Z6841 Body Mass Index (BMI) 40.0 and over, adult: Secondary | ICD-10-CM | POA: Diagnosis not present

## 2019-04-09 DIAGNOSIS — I10 Essential (primary) hypertension: Secondary | ICD-10-CM | POA: Diagnosis not present

## 2019-04-09 DIAGNOSIS — M48062 Spinal stenosis, lumbar region with neurogenic claudication: Secondary | ICD-10-CM | POA: Diagnosis not present

## 2019-04-09 DIAGNOSIS — M542 Cervicalgia: Secondary | ICD-10-CM | POA: Diagnosis not present

## 2019-04-12 DIAGNOSIS — R400 Somnolence: Secondary | ICD-10-CM | POA: Insufficient documentation

## 2019-04-12 NOTE — Addendum Note (Signed)
Addended by: Larey Seat on: 04/12/2019 05:43 PM   Modules accepted: Orders

## 2019-04-12 NOTE — Procedures (Signed)
PATIENT'S NAME:  Amy, [name]  DOB:      [birth year]      MR#:    [id number]     DATE OF RECORDING: 04/05/2019  [facility] REFERRING M.D.:  Sarina Ill, MD Study Performed:   Baseline Polysomnogram  HISTORY:  Amy [name] is a 73 y.o. married female patient and retired Tourist information centre manager. She was seen upon referral from Dr. Jaynee Eagles on 01-22-2019 in a video guided virtual visit. Chief complaint: Untreated OSA- A diagnostic PSG took place 12 years ago and the patient felt intolerant of the prescribed CPAP therapy at the time: "It almost killed me" .She returned the device. A test in form of an HST was performed on 02-17-2019 at [facility] and diagnosed mild sleep apnea with an AHI of 8.4/h.  Her medical history: Small Bowel Obstruction, PVD, GERD, sensory Neuropathy, painful- affecting feet and legs, Asthma, DDD, headaches, glaucoma, Iron def. Anemia. Not a diabetic.   Social history: married, retired Tourist information centre manager.  Dinner time is 5 -7 PM, and 8-9 PM is bedtime. She watches TV in her bedroom: "I try to stay awake to watch my favorite show at midnight, but then I cannot sleep". TV remains on all night, the bedroom is neither cool, nor quiet, nor dark. Her husband has long moved to another bedroom. Many mornings has a headache of dull, constant, not throbbing. Sleep has shifted into the morning time since 2004- her date of retirement.   The patient endorsed the Epworth Sleepiness Scale at 5/24 points. FSS at 38/63 points, no GDS available.  The patient's weight 287 pounds with a height of 65 (inches), resulting in a BMI of 47.8 kg/m2. The patient's neck circumference measured 18 inches.  CURRENT MEDICATIONS: Tylenol, Azilex, B-Complex, calcium-vitamin D, Benadryl, Flonase, Neurontin, Apresoline, Cozaar, Mobic, Robaxin, Procardia, Protonix, Paxil, KCl, Nucynta, Cyanocobalamin, Vitamin E.   PROCEDURE:  This is a multichannel digital polysomnogram utilizing the Somnostar 11.2 system.  Electrodes and sensors were  applied and monitored per AASM Specifications.   EEG, EOG, Chin and Limb EMG, were sampled at 200 Hz.  ECG, Snore and Nasal Pressure, Thermal Airflow, Respiratory Effort, CPAP Flow and Pressure, Oximetry was sampled at 50 Hz. Digital video and audio were recorded.      BASELINE STUDY: Lights Out was at 21:04 and Lights On at 04:58.  Total recording time (TRT) was 474.5 minutes, with a total sleep time (TST) of 331.5 minutes.   The patient's sleep latency was 29 minutes.  REM latency was 381.5 minutes.  The sleep efficiency was 69.9 %.     SLEEP ARCHITECTURE: WASO (Wake after sleep onset) was 88.5 minutes.  There were 4 minutes in Stage N1, 214 minutes Stage N2, 75 minutes Stage N3 and 38.5 minutes in Stage REM.  The percentage of Stage N1 was 1.2%, Stage N2 was 64.6%, Stage N3 was 22.6% and Stage R (REM sleep) was 11.6%.   RESPIRATORY ANALYSIS:  There were a total of 37 respiratory events:  2 obstructive apneas, 1 central apnea and 34 hypopneas. The patient also snored loudly while supine and while on her left side.    The total APNEA/HYPOPNEA INDEX (AHI) was 6.7/hour.  The RDI was much higher, at 21/h.  17 events occurred in REM sleep and 37 events in NREM. The REM AHI was 26.5 /hour, versus a non-REM AHI of 4.1.  The patient spent 220 minutes of total sleep time in the supine position and 112 minutes in non-supine. The supine AHI was 9.0/h versus a non-supine  AHI of 2.1.  OXYGEN SATURATION & C02:  The Wake baseline 02 saturation was 96%, with the lowest being 86%. Time spent below 89% saturation equaled 5 minutes.  The arousals were noted as: 80 were spontaneous, 0 were associated with PLMs, and 16 were associated with respiratory events. The patient had a total of 0 Periodic Limb Movements.    Audio and video analysis did not show any abnormal or unusual movements, behaviors, phonations or vocalizations. The patient took two bathroom breaks. EKG was in keeping with normal sinus rhythm  (NSR).  Post-study, the patient indicated that sleep was the same as usual.    IMPRESSION:  1. Mild Obstructive Sleep Apnea (OSA) at AHI 6.7/h associated with supine accentuation and strong REM sleep dependence (REM AHI was 26.5/h).  2. Loud snoring leading to many additional arousals in supine sleep.  3. No hypoxia of sleep/  4. Normal EKG    RECOMMENDATIONS:  1. My first choice is a full-night, attended, CPAP titration study to optimize therapy for this patient with significant claustrophobia, morning headaches and fragmentation of sleep.  2. Plan B is auto titration by CPAP at home. 3. Dental device is not an option for REM sleep dependent Apnea as documented in this attended sleep study.   I certify that I have reviewed the entire raw data recording prior to the issuance of this report in accordance with the Standards of Accreditation of the American Academy of Sleep Medicine (AASM) Larey Seat, MD  04-10-2019 Diplomat, American Board of Psychiatry and Neurology  Diplomat, American Board of [place] Director, [employer] [facility] [employer]

## 2019-04-13 ENCOUNTER — Other Ambulatory Visit: Payer: Self-pay | Admitting: Neurology

## 2019-04-13 DIAGNOSIS — G4733 Obstructive sleep apnea (adult) (pediatric): Secondary | ICD-10-CM

## 2019-04-14 ENCOUNTER — Telehealth: Payer: Self-pay | Admitting: Neurology

## 2019-04-14 NOTE — Telephone Encounter (Signed)
I called pt. I advised pt that Dr. Brett Fairy reviewed their sleep study results and found that pt mild sleep apnea. Dr. Brett Fairy recommends that pt come in for a titration study. The patient was set up from previously sent order for auto CPAP. Advised the patient I will reach out to [facility] for her to find out if we need to complete the CPAP titration study or if they can use the auto CPAP that she was distributed on 6/23. Advised also that I will check on the status on whether they have started to bill that machine through insurance. Pt verbalized understanding and was appreciative for checking into this.

## 2019-04-14 NOTE — Telephone Encounter (Signed)
Called the patient there was no answer. LVM informing the patient that she will not need to complete CPAP titration study. Advised she needs to continue to use the auto CPAP and that we will need to get her back on the schedule for the initial CPAP follow up. This apt can be with Jinny Blossom, Amy or Dr Brett Fairy, whoever has openings and must be completed 04/17/2019-06/17/2019.  Please schedule the patient when she calls back for initial cpap

## 2019-04-18 ENCOUNTER — Ambulatory Visit
Admission: RE | Admit: 2019-04-18 | Discharge: 2019-04-18 | Disposition: A | Discharge status: Home / Self Care | Payer: Medicare Other | Source: Ambulatory Visit | Attending: Neurosurgery | Admitting: Neurosurgery

## 2019-04-18 ENCOUNTER — Other Ambulatory Visit: Payer: Self-pay

## 2019-04-18 DIAGNOSIS — M47812 Spondylosis without myelopathy or radiculopathy, cervical region: Secondary | ICD-10-CM | POA: Diagnosis not present

## 2019-04-18 DIAGNOSIS — M542 Cervicalgia: Secondary | ICD-10-CM

## 2019-04-30 ENCOUNTER — Ambulatory Visit: Payer: Self-pay | Admitting: Adult Health

## 2019-05-05 DIAGNOSIS — G8929 Other chronic pain: Secondary | ICD-10-CM | POA: Insufficient documentation

## 2019-05-05 DIAGNOSIS — M542 Cervicalgia: Secondary | ICD-10-CM | POA: Diagnosis not present

## 2019-05-05 DIAGNOSIS — S32009K Unspecified fracture of unspecified lumbar vertebra, subsequent encounter for fracture with nonunion: Secondary | ICD-10-CM | POA: Diagnosis not present

## 2019-05-08 DIAGNOSIS — R079 Chest pain, unspecified: Secondary | ICD-10-CM | POA: Diagnosis not present

## 2019-05-08 DIAGNOSIS — M797 Fibromyalgia: Secondary | ICD-10-CM | POA: Diagnosis not present

## 2019-05-08 DIAGNOSIS — G4730 Sleep apnea, unspecified: Secondary | ICD-10-CM | POA: Diagnosis not present

## 2019-05-08 DIAGNOSIS — I10 Essential (primary) hypertension: Secondary | ICD-10-CM | POA: Diagnosis not present

## 2019-05-08 DIAGNOSIS — K219 Gastro-esophageal reflux disease without esophagitis: Secondary | ICD-10-CM | POA: Diagnosis not present

## 2019-05-08 DIAGNOSIS — M545 Low back pain: Secondary | ICD-10-CM | POA: Diagnosis not present

## 2019-05-12 ENCOUNTER — Other Ambulatory Visit: Payer: Self-pay

## 2019-05-12 ENCOUNTER — Ambulatory Visit (INDEPENDENT_AMBULATORY_CARE_PROVIDER_SITE_OTHER): Payer: Medicare Other | Admitting: Sports Medicine

## 2019-05-12 ENCOUNTER — Ambulatory Visit (INDEPENDENT_AMBULATORY_CARE_PROVIDER_SITE_OTHER): Payer: Medicare Other

## 2019-05-12 ENCOUNTER — Encounter: Payer: Self-pay | Admitting: Sports Medicine

## 2019-05-12 ENCOUNTER — Other Ambulatory Visit: Payer: Self-pay | Admitting: Sports Medicine

## 2019-05-12 VITALS — Temp 97.9°F

## 2019-05-12 DIAGNOSIS — M79671 Pain in right foot: Secondary | ICD-10-CM | POA: Diagnosis not present

## 2019-05-12 DIAGNOSIS — S9032XA Contusion of left foot, initial encounter: Secondary | ICD-10-CM

## 2019-05-12 DIAGNOSIS — M779 Enthesopathy, unspecified: Secondary | ICD-10-CM | POA: Diagnosis not present

## 2019-05-12 DIAGNOSIS — S9002XA Contusion of left ankle, initial encounter: Secondary | ICD-10-CM

## 2019-05-12 DIAGNOSIS — Z9181 History of falling: Secondary | ICD-10-CM | POA: Diagnosis not present

## 2019-05-12 DIAGNOSIS — M7661 Achilles tendinitis, right leg: Secondary | ICD-10-CM

## 2019-05-12 MED ORDER — PREDNISONE 10 MG (21) PO TBPK: ORAL_TABLET | ORAL | 0 refills | Status: DC

## 2019-05-12 NOTE — Progress Notes (Signed)
Subjective:  Amy [name] is a 73 y.o. female patient who presents to office for evaluation of now left ankle pain and continued right ankle pain. Patient complains of continued pain in the ankle after falling again and reports that this time she fell in the kitchen when standing and injured the left foot reports that the swelling goes up and down and reports that the pain is all over but feels numb cannot feel much of anything patient also still reports that there is pain at the back of the right heel that continues to swell and is wondering if she should still continue to wear her boot reports that the pain on the left was 3-4 out of 10 and the pain on the right is 5 out of 10.  Patient denies any other pedal complaints.  Patient Active Problem List   Diagnosis Date Noted  . Uncontrolled daytime somnolence 04/12/2019  . Snoring 01/22/2019  . Morbid obesity with body mass index of 45.0-49.9 in adult [facility]) 01/22/2019  . OSA (obstructive sleep apnea) 01/22/2019  . Wound dehiscence 07/22/2016  . Dyspnea 07/21/2016  . Acute encephalopathy 07/21/2016  . Anemia 07/21/2016  . Pancreatitis 07/02/2016  . SBO (small bowel obstruction) ([place]) 07/02/2016  . Long term current use of opiate analgesic 06/26/2016  . Long term prescription opiate use 06/26/2016  . Opiate use 06/26/2016  . Encounter for therapeutic drug level monitoring 06/26/2016  . Encounter for pain management planning 06/26/2016  . Chronic pain 06/26/2016  . Disturbance of skin sensation 06/26/2016  . Chronic low back pain (Location of Primary Source of Pain) (Bilateral) (R>L) 06/26/2016  . Chronic hip pain (Location of Secondary source of pain) (Bilateral) (R>L) 06/26/2016  . 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 Chronic shoulder pain (Location of Tertiary source of pain) (Bilateral) (R>L) 06/26/2016  . 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 Chronic knee pain (Bilateral) (R>L)  06/26/2016  . 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 Chronic neck pain (Right) 06/26/2016  . Lumbar spondylosis 06/26/2016  . Lumbar facet syndrome 06/26/2016  . Lumbar facet hypertrophy 06/26/2016  . 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 Neurogenic pain 06/26/2016  . Occipital headaches 06/26/2016  . 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 History of lumbar fusion 06/26/2016  . 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  . Neuropathic pain of both legs 10/13/2015  . Constipation 10/13/2015  . Complete rotator cuff tear of left shoulder 05/05/2014  . Depression 08/06/2013  . Hypokalemia 08/06/2013  . GERD (gastroesophageal reflux disease) 08/06/2013  . Spinal stenosis of lumbar region 08/06/2013  . Bladder spasm 08/06/2013  . Morbid obesity ([place]) 02/03/2013  . Lipoma of buttock s/p excision 02/20/2013 02/03/2013  . DYSPNEA 01/23/2008  . Obstructive sleep apnea 09/12/2007  . HTN (hypertension) 09/12/2007  . Allergic rhinitis 09/12/2007  . SLEEPINESS 09/12/2007  . ANGINA, HX OF 09/12/2007    Current Outpatient Medications on File Prior to Visit  Medication Sig Dispense Refill  . acetaminophen (TYLENOL) 500 MG tablet Take 1,000 mg by mouth every 6 (six) hours as needed for moderate pain.     Marland Kitchen azelaic acid (AZELEX) 20 % cream Apply 1 application topically 2 (two) times daily. After skin is thoroughly washed and patted dry, gently but thoroughly massage a thin film of azelaic acid cream into the affected area twice daily, in the morning and evening.    . B Complex Vitamins (B COMPLEX PO) Take by mouth.    Marland Kitchen  calcium-vitamin D (OSCAL WITH D) 500-200 MG-UNIT tablet Take 1 tablet by mouth daily with breakfast.    . cloNIDine (CATAPRES) 0.1 MG tablet Take 1 tablet (0.1 mg total) by mouth 3 (three) times daily. 60 tablet 11  . diclofenac sodium (VOLTAREN) 1 % GEL Apply 4 g topically 4 (four) times daily. 100 g 0  . diphenhydrAMINE  (BENADRYL) 25 MG tablet Take 25 mg by mouth 2 (two) times daily as needed for allergies.    Marland Kitchen estradiol (ESTRACE) 0.1 MG/GM vaginal cream Place 1 Applicatorful vaginally 2 (two) times a week. (Patient taking differently: Place 1 Applicatorful vaginally 2 (two) times a week. Tuesday and Thursday) 42.5 g 12  . fluticasone (FLONASE) 50 MCG/ACT nasal spray Place 1 spray into both nostrils daily.  2  . gabapentin (NEURONTIN) 300 MG capsule Take 300 mg by mouth 2 (two) times daily.    . hydrALAZINE (APRESOLINE) 50 MG tablet Take 50 mg by mouth 3 (three) times daily.    Marland Kitchen losartan (COZAAR) 100 MG tablet TK 1 T PO ONCE D    . meloxicam (MOBIC) 15 MG tablet Take 15 mg by mouth daily.    . Menthol-Methyl Salicylate (MUSCLE RUB EX) Apply 1 application topically 2 (two) times daily as needed (pain in neck and back).     . methocarbamol (ROBAXIN) 500 MG tablet Take 500 mg by mouth 2 (two) times daily as needed for muscle spasms.    . metoprolol (LOPRESSOR) 50 MG tablet Take 1 tablet (50 mg total) by mouth 2 (two) times daily.    Marland Kitchen NIFEdipine (PROCARDIA) 10 MG capsule Take 10 mg by mouth 3 (three) times daily.    . ondansetron (ZOFRAN-ODT) 4 MG disintegrating tablet DIS 1 T ON THE TONGUE TID PRN    . OVER THE COUNTER MEDICATION Take 2 tablets by mouth daily. Swiss Kriss OTC herbal laxative    . pantoprazole (PROTONIX) 40 MG tablet Take 80 mg by mouth daily.   0  . PARoxetine (PAXIL) 30 MG tablet Take 30 mg by mouth daily.    . potassium chloride SA (K-DUR,KLOR-CON) 20 MEQ tablet Take 20 mEq by mouth daily.    . tapentadol (NUCYNTA) 50 MG tablet Take 50 mg by mouth every 6 (six) hours as needed for moderate pain.    . vitamin B-12 (CYANOCOBALAMIN) 1000 MCG tablet Take 1,000 mcg by mouth daily.    . vitamin E (VITAMIN E) 400 UNIT capsule Take 400 Units by mouth daily.     No current facility-administered medications on file prior to visit.     Allergies  Allergen Reactions  . Latex Other (See Comments)     Sores, blisters - reaction to gloves  . Crestor [Rosuvastatin Calcium]     Myalgias  . Penicillins Other (See Comments)    UNSPECIFIED REACTION  "Has taken amoxicillin and ampicillin with no reaction" Has patient had a PCN reaction causing immediate rash, facial/tongue/throat swelling, SOB or lightheadedness with hypotension: Unknown Has patient had a PCN reaction causing severe rash involving mucus membranes or skin necrosis: Unknown Has patient had a PCN reaction that required hospitalization: Unknown Has patient had a PCN reaction occurring within the last 10 years: No   . Tetanus Toxoids Rash  . Tomato Rash    Objective:  General: Alert and oriented x3 in no acute distress  Dermatology: No open lesions bilateral lower extremities, no webspace macerations, no ecchymosis bilateral, all nails x 10 are well manicured.  Vascular: Dorsalis Pedis and Posterior  Tibial pedal pulses palpable, Capillary Fill Time 3 seconds,(+) pedal hair growth bilateral, trace edema bilateral lower extremities with R>L, Temperature gradient within normal limits. No acute ischemia.   Neurology: Johney Maine sensation intact via light touch bilateral.   Musculoskeletal: There is tenderness with palpation at both right>left foot and ankle and lower legs even at calf, Negative talar tilt, + tib-fib stress, + instability. Pain with palpation to the achilles on right>left. No pain with calf compression bilateral. Range of motion within normal limits with mild guarding on Right>left ankle.   Gait: Antalgic gait  Xrays  Left foot/ankle   Impression:soft tissue swelling. No fracture.   Assessment and Plan: Problem List Items Addressed This Visit    None    Visit Diagnoses    Contusion of left ankle, initial encounter    -  Primary   Relevant Orders   DG Ankle Complete Left (Completed)   Contusion of left foot, initial encounter       Tendonitis       Relevant Medications   predniSONE (STERAPRED UNI-PAK 21 TAB)  10 MG (21) TBPK tablet   History of fall       Tendonitis, Achilles, right       Right foot pain         -Complete examination performed -Xrays reviewed on left and previous MRI reviewed on right -Discussed treatment options for bilateral foot and ankle pain with history of fall -Advised PT; Patient to call office back with referral info since she has to do PT for other things as Rx by Dr. Weston Settle -Dispensed night splint to use and instrcted -Rx Prednisone to help with pain and inflammation -Continue with rest, ice, elevation and CAM boot on right and heel lift on left as dispensed again this visit  -Patient to return to office after PT/6 weeks or sooner if condition worsens.  Landis Martins, DPM

## 2019-05-20 NOTE — Telephone Encounter (Signed)
Called the patient she needs to be seen before 06/13/2019. I have scheduled the patient for 05/28/2019 at 11 am with Debbora Presto, NP

## 2019-05-28 ENCOUNTER — Ambulatory Visit (INDEPENDENT_AMBULATORY_CARE_PROVIDER_SITE_OTHER): Payer: Medicare Other | Admitting: Family Medicine

## 2019-05-28 ENCOUNTER — Encounter: Payer: Self-pay | Admitting: Family Medicine

## 2019-05-28 ENCOUNTER — Other Ambulatory Visit: Payer: Self-pay

## 2019-05-28 VITALS — BP 129/72 | HR 76 | Temp 97.8°F | Ht 65.0 in | Wt 295.4 lb

## 2019-05-28 DIAGNOSIS — G4733 Obstructive sleep apnea (adult) (pediatric): Secondary | ICD-10-CM

## 2019-05-28 NOTE — Progress Notes (Signed)
PATIENT: Amy [name] DOB: [birth year]  REASON FOR VISIT: follow up HISTORY FROM: patient  Chief Complaint  Patient presents with  . Follow-up    Initial cpap f/u. Alone. Rm 6. Patient mentioned that she is still having issues with sleeping thru the night. Patient mentioned that she feels tired.      HISTORY OF PRESENT ILLNESS: Today 05/28/19 Amy [name] is a 73 y.o. female here today for follow up of OSA on CPAP.  She reports that she is adjusting to CPAP therapy.  Unfortunately, she has recently lost her baby brother.  She has been more depressed and is grieving.  She has tried to be as consistent with use as possible.  Compliance report dated 04/27/2019 through 05/26/2019 reveals that she is used CPAP 23 of the last 30 days for compliance of 77%.  23 days she used CPAP greater than 4 hours for compliance of 77%.  Average usage was 7 hours and 13 minutes.  AHI was 2.3 on 5 to 15 cm of water and EPR of 3.  There was no significant leak.  She improvement in daytime sleepiness nursing there.  HISTORY: (copied from Dr Dohmeier's note on 01/22/2019)  HPI:  Amy [name] is a 73 y.o. female , seen here as in a referral  from Dr. Jaynee Eagles. Chief complaint according to patient :Untreated OSA-   Patient has had a PSG in lab at the [facility] location, about 12 years ago and was diagnosed with OSA but could not tolerate the prescribed CPAP at the time: " It almost killed me "  She returned the device after 2-3 weeks. She felt suffocated.   Sleep and medical history: small bowel obstruction, PVD, GERD, Neuropathy, painful- feet and legs.  Asthma , DDD, headaches, glaucoma, Iron def. anemia.   Family sleep history: parents were snorers, especially father, none of her siblings were diagnosed with OSA. 6 brothers and 2 sisters. HTN is prevalent. Several have CAD an  heart murmurs.  DM in both sides of the family. Mother had cervical cancer.  Social history:  retired Tourist information centre manager. lives with husband and his cousin. .  Was trying to smoke while in college- quit at age 55.no ETOH,  Coffee 1-2 cups a day, drinks iced tea or soda when eating out- now now.    Sleep habits are as follows: dinner time is 5 -7 PM, 8-9 PM is bedtime. She watches TV and waits to fall asleep. I try to stay awake to watch my favorite at 12 midnight", then I cannot sleep.  Sleeps on left side, and 3-4 pillows. Bedroom is neither cool, nor quiet nor dark. Her husband has moved to another bedroom. Stays asleep for 1-3 hours, and has nocturia around 3 AM and 5 AM. Rises at 10 AM. Total Sleep time has become 5 hours or less. Each bathroom break leads to a long struggle to go back to sleep. Woken some night by headaches, sharper, throbbing , they come and go more quickly.  Many mornings has a headache of dull, constant , not throbbing.  Sleep has shifted into the morning time over many years. Since 2004- date of retirement.  She used to take a mid day nap- 2 hours!   REVIEW OF SYSTEMS: Out of a complete 14 system review of symptoms, the patient complains only of the following symptoms, dizziness, headaches, numbness, weakness and all other reviewed systems are negative.   ALLERGIES: Allergies  Allergen Reactions  . Latex Other (  See Comments)    Sores, blisters - reaction to gloves  . Crestor [Rosuvastatin Calcium]     Myalgias  . Penicillins Other (See Comments)    UNSPECIFIED REACTION  "Has taken amoxicillin and ampicillin with no reaction" Has patient had a PCN reaction causing immediate rash, facial/tongue/throat swelling, SOB or lightheadedness with hypotension: Unknown Has patient had a PCN reaction causing severe rash involving mucus membranes or skin necrosis: Unknown Has patient had a PCN reaction that required hospitalization: Unknown Has patient had a PCN reaction occurring within the last 10 years: No   . Tetanus Toxoids Rash  . Tomato Rash    HOME MEDICATIONS:  Outpatient Medications Prior to Visit  Medication Sig Dispense Refill  . acetaminophen (TYLENOL) 500 MG tablet Take 1,000 mg by mouth every 6 (six) hours as needed for moderate pain.     Marland Kitchen azelaic acid (AZELEX) 20 % cream Apply 1 application topically 2 (two) times daily. After skin is thoroughly washed and patted dry, gently but thoroughly massage a thin film of azelaic acid cream into the affected area twice daily, in the morning and evening.    . B Complex Vitamins (B COMPLEX PO) Take by mouth.    . calcium-vitamin D (OSCAL WITH D) 500-200 MG-UNIT tablet Take 1 tablet by mouth daily with breakfast.    . cloNIDine (CATAPRES) 0.1 MG tablet Take 1 tablet (0.1 mg total) by mouth 3 (three) times daily. 60 tablet 11  . diclofenac sodium (VOLTAREN) 1 % GEL Apply 4 g topically 4 (four) times daily. 100 g 0  . diphenhydrAMINE (BENADRYL) 25 MG tablet Take 25 mg by mouth 2 (two) times daily as needed for allergies.    Marland Kitchen estradiol (ESTRACE) 0.1 MG/GM vaginal cream Place 1 Applicatorful vaginally 2 (two) times a week. (Patient taking differently: Place 1 Applicatorful vaginally 2 (two) times a week. Tuesday and Thursday) 42.5 g 12  . fluticasone (FLONASE) 50 MCG/ACT nasal spray Place 1 spray into both nostrils daily.  2  . gabapentin (NEURONTIN) 300 MG capsule Take 300 mg by mouth 2 (two) times daily.    . hydrALAZINE (APRESOLINE) 50 MG tablet Take 50 mg by mouth 3 (three) times daily.    . hydrochlorothiazide (HYDRODIURIL) 12.5 MG tablet Take 12.5 mg by mouth daily.    Marland Kitchen losartan (COZAAR) 100 MG tablet TK 1 T PO ONCE D    . meloxicam (MOBIC) 15 MG tablet Take 15 mg by mouth daily.    . Menthol-Methyl Salicylate (MUSCLE RUB EX) Apply 1 application topically 2 (two) times daily as needed (pain in neck and back).     . methocarbamol (ROBAXIN) 500 MG tablet Take 500 mg by mouth 2 (two) times daily as needed for muscle spasms.    . metoprolol (LOPRESSOR) 50 MG tablet Take 1 tablet (50 mg total) by mouth 2 (two)  times daily.    Marland Kitchen NIFEdipine (PROCARDIA) 10 MG capsule Take 10 mg by mouth 3 (three) times daily.    . ondansetron (ZOFRAN-ODT) 4 MG disintegrating tablet DIS 1 T ON THE TONGUE TID PRN    . OVER THE COUNTER MEDICATION Take 2 tablets by mouth daily. Swiss Kriss OTC herbal laxative    . pantoprazole (PROTONIX) 40 MG tablet Take 80 mg by mouth daily.   0  . PARoxetine (PAXIL) 30 MG tablet Take 30 mg by mouth daily.    . potassium chloride SA (K-DUR,KLOR-CON) 20 MEQ tablet Take 20 mEq by mouth daily.    . tapentadol (  NUCYNTA) 50 MG tablet Take 50 mg by mouth every 6 (six) hours as needed for moderate pain.    . vitamin B-12 (CYANOCOBALAMIN) 1000 MCG tablet Take 1,000 mcg by mouth daily.    . vitamin E (VITAMIN E) 400 UNIT capsule Take 400 Units by mouth daily.    . predniSONE (STERAPRED UNI-PAK 21 TAB) 10 MG (21) TBPK tablet Take as directed 21 tablet 0   No facility-administered medications prior to visit.     PAST MEDICAL HISTORY: Past Medical History:  Diagnosis Date  . Abdominal pain of unknown etiology 02/05/2016  . Anxiety   . Arthritis    ddd- RA  . Asthma    "sleeping asthma"  . Chronic back pain    lumbar steroid injection's  . Complication of anesthesia    Hard to wake up. Pt sts is took 3 days.  . Constipation   . Depression   . Dysphonia    intermittent "voice changes"  . Esophageal dysmotility   . Fibromyalgia   . Fibromyalgia   . GERD (gastroesophageal reflux disease)   . Glaucoma   . Headache   . High cholesterol    ? pt states her doctor told her to continue eating vegetables & fruit  . History of DVT (deep vein thrombosis) 1989   LEFT UPPER ARM  . History of hiatal hernia   . History of MRSA infection 2011  . Hypertension   . Irregular heart rate    "years ago"  . Low iron   . Lumbago   . Mild obstructive sleep apnea    per study 02-07-2006 - no cpap  . Neuropathy    feet  . Pelvic pain in female   . Peripheral vascular disease ([place])    "poor  circulation"  . SBO (small bowel obstruction) ([place]) 06/2016  . Seasonal allergies   . Short of breath on exertion   . Urinary frequency   . Vaginal atrophy   . Weakness    both hands and feet    PAST SURGICAL HISTORY: Past Surgical History:  Procedure Laterality Date  . abdominal adhesions removed    . ABDOMINAL HYSTERECTOMY  1978  . BACK SURGERY    . BUNIONECTOMY Left 2008  . CATARACT EXTRACTION Bilateral 2017  . COLON SURGERY    . COLONOSCOPY    . CYSTO WITH HYDRODISTENSION N/A 07/12/2015   Procedure: CYSTOSCOPY/HYDRODISTENSION;  Surgeon: Bjorn Loser, MD;  Location: [facility];  Service: Urology;  Laterality: N/A;  . DILATION AND CURETTAGE OF UTERUS    . ESOPHAGEAL MANOMETRY N/A 11/22/2014   Procedure: ESOPHAGEAL MANOMETRY (EM);  Surgeon: Winfield Cunas., MD;  Location: [facility];  Service: Endoscopy;  Laterality: N/A;  . ESOPHAGOGASTRODUODENOSCOPY N/A 07/15/2013   Procedure: ESOPHAGOGASTRODUODENOSCOPY (EGD);  Surgeon: Winfield Cunas., MD;  Location: [facility];  Service: Endoscopy;  Laterality: N/A;  need xray  . EXPLORATORY LAPAROTOMY    . EYE SURGERY Bilateral    cataract surgery with lens implants  . HARDWARE REMOVAL N/A 01/02/2016   Procedure: Exploration of Lumbar Fusion,Removal of hardware Lumbar One-Two ;Redo Posterior Lumbar Fusion Lumbar One-Two;  Surgeon: Kary Kos, MD;  Location: [facility];  Service: Neurosurgery;  Laterality: N/A;  . ingrown toenail removal    . LAPAROSCOPIC CHOLECYSTECTOMY  01-11-2006  . LAPAROSCOPY N/A 07/06/2016   Procedure: LAPAROSCOPY DIAGNOSTIC EMERGENT TO OPEN;  Surgeon: Greer Pickerel, MD;  Location: [facility];  Service: General;  Laterality: N/A;  . LAPAROTOMY N/A  07/06/2016   Procedure: EXPLORATORY LAPAROTOMY LYSIS OF ADHESIONS FOR 3 HOURS;  Surgeon: Greer Pickerel, MD;  Location: [facility];  Service: General;  Laterality: N/A;  . lipoma removal    . LUMBAR FUSION  2014   L4 -- L5  . MASS EXCISION Left 02/20/2013    Procedure: EXCISION LEFT BUTTOCK  MASS;  Surgeon: Adin Hector, MD;  Location: [facility];  Service: General;  Laterality: Left;  . NOSE SURGERY  2007  . REMOVAL HARDWARE L4-L5/  BILATERAL LAMINECTOMY L2 - L5 AND FUSION  06-12-2011  . SHOULDER OPEN ROTATOR CUFF REPAIR Left 05/05/2014   Procedure: OPEN ACROMIONECTOMY AND OPEN REPAIR OF ROTATOR CUFF, TISSUEMEND GRAFT WITH ANCHOR ;  Surgeon: Tobi Bastos, MD;  Location: [facility];  Service: Orthopedics;  Laterality: Left;  . SPINE SURGERY     x6  . TONSILLECTOMY      FAMILY HISTORY: Family History  Problem Relation Age of Onset  . Hypertension Mother   . Cancer Mother        cervix  . Kidney disease Mother   . Osteoarthritis Mother   . Diabetes Mother   . Hypertension Sister   . Cancer Sister        polyps  . Kidney disease Brother   . Cancer Daughter        leukemia  . Hypertension Brother   . Liver cancer Brother   . Hypertension Father   . Colon cancer Father   . Colon polyps Father   . Hypertension Brother   . Liver cancer Brother   . Aneurysm Maternal Grandmother   . Migraines Neg Hx   . Headache Neg Hx     SOCIAL HISTORY: Social History   Socioeconomic History  . Marital status: Married    Spouse name: Not on file  . Number of children: 1  . Years of education: Not on file  . Highest education level: Master's degree (e.g., MA, MS, MEng, MEd, MSW, MBA)  Occupational History  . Not on file  Social Needs  . Financial resource strain: Not on file  . Food insecurity    Worry: Not on file    Inability: Not on file  . Transportation needs    Medical: Not on file    Non-medical: Not on file  Tobacco Use  . Smoking status: Former Smoker    Quit date: 07/10/1969    Years since quitting: 49.9  . Smokeless tobacco: Never Used  Substance and Sexual Activity  . Alcohol use: Not Currently    Comment: rare, social  . Drug use: No  . Sexual activity: Not on file  Lifestyle  . Physical activity    Days per week:  Not on file    Minutes per session: Not on file  . Stress: Not on file  Relationships  . Social Herbalist on phone: Not on file    Gets together: Not on file    Attends religious service: Not on file    Active member of club or organization: Not on file    Attends meetings of clubs or organizations: Not on file    Relationship status: Not on file  . Intimate partner violence    Fear of current or ex partner: Not on file    Emotionally abused: Not on file    Physically abused: Not on file    Forced sexual activity: Not on file  Other Topics Concern  . Not on file  Social History Narrative   Lives at home with husband and his cousin   Right handed   Caffeine: max 2 cups coffee per day    PHYSICAL EXAM  Vitals:   05/28/19 1054  BP: 129/72  Pulse: 76  Temp: 97.8 F (36.6 C)  TempSrc: Oral  Weight: 295 lb 6.4 oz (134 kg)  Height: 5\' 5"  (1.651 m)   Body mass index is 49.16 kg/m.  Generalized: Well developed, in no acute distress  Cardiology: normal rate and rhythm, no murmur noted Respiratory: Clear to auscultation bilaterally mallampati 5+, neck circ 18" Neurological examination  Mentation: Alert oriented to time, place, history taking. Follows all commands speech and language fluent Cranial nerve II-XII: Pupils were equal round reactive to light. Extraocular movements were full, visual field were full on confrontational test. Facial sensation and strength were normal. Uvula tongue midline. Head turning and shoulder shrug  were normal and symmetric. Motor: The motor testing reveals 4 over 5 strength of all 4 extremities. Good symmetric motor tone is noted throughout.  Gait and station: Gait is wide, guarded, walks with cane   DIAGNOSTIC DATA (LABS, IMAGING, TESTING) - I reviewed patient records, labs, notes, testing and imaging myself where available.  No flowsheet data found.   Lab Results  Component Value Date   WBC 6.6 03/30/2019   HGB 13.7 03/30/2019    HCT 42.5 03/30/2019   MCV 93.4 03/30/2019   PLT 287 03/30/2019      Component Value Date/Time   NA 136 03/30/2019 1832   NA 137 09/14/2016   K 4.3 03/30/2019 1832   CL 102 03/30/2019 1832   CO2 26 03/30/2019 1832   GLUCOSE 86 03/30/2019 1832   BUN 13 03/30/2019 1832   BUN 13 09/14/2016   CREATININE 1.14 (H) 03/30/2019 1832   CALCIUM 8.9 03/30/2019 1832   PROT 7.2 04/05/2018 1342   ALBUMIN 3.4 (L) 04/05/2018 1342   AST 20 04/05/2018 1342   ALT 15 04/05/2018 1342   ALKPHOS 133 (H) 04/05/2018 1342   BILITOT 0.3 04/05/2018 1342   GFRNONAA 48 (L) 03/30/2019 1832   GFRAA 55 (L) 03/30/2019 1832   Lab Results  Component Value Date   CHOL 165 07/03/2016   HDL 82 07/03/2016   LDLCALC 75 07/03/2016   TRIG 148 07/27/2016   CHOLHDL 2.0 07/03/2016   No results found for: HGBA1C Lab Results  Component Value Date   VITAMINB12 2,661 (H) 06/27/2016   Lab Results  Component Value Date   TSH 2.825 07/07/2016    ASSESSMENT AND PLAN 73 y.o. year old female  has a past medical history of Abdominal pain of unknown etiology (02/05/2016), Anxiety, Arthritis, Asthma, Chronic back pain, Complication of anesthesia, Constipation, Depression, Dysphonia, Esophageal dysmotility, Fibromyalgia, Fibromyalgia, GERD (gastroesophageal reflux disease), Glaucoma, Headache, High cholesterol, History of DVT (deep vein thrombosis) (1989), History of hiatal hernia, History of MRSA infection (2011), Hypertension, Irregular heart rate, Low iron, Lumbago, Mild obstructive sleep apnea, Neuropathy, Pelvic pain in female, Peripheral vascular disease ([place]), SBO (small bowel obstruction) ([place]) (06/2016), Seasonal allergies, Short of breath on exertion, Urinary frequency, Vaginal atrophy, and Weakness. here with     ICD-10-CM   1. OSA (obstructive sleep apnea)  G47.33     Mrs [name] is adjusting to CPAP therapy.  Compliance report reveals she is using CPAP about 77% of the time.  I feel that this is most likely  related to the recent death of her brother.  I am certain that compliance will  improve.  She was encouraged to continue using CPAP therapy nightly and for greater than 4 hours each night.  I have expressed my deepest condolences for the loss of her brother.  She was advised to reach out if there was anything we could help with.  We will see her back in about 6 months.  She verbalizes understanding and agreement with this plan.   No orders of the defined types were placed in this encounter.    No orders of the defined types were placed in this encounter.     I spent 15 minutes with the patient. 50% of this time was spent counseling and educating patient on plan of care and medications.    Debbora Presto, FNP-C 05/28/2019, 11:27 AM [facility] [address] [place], [place] [address] [phone]

## 2019-05-28 NOTE — Patient Instructions (Signed)
Continue CPAP nightly and for greater than 4 hours each night  I am so very sorry for your loss. Please let us know if there is anything we can do.   Follow up in 6 months, sooner if needed   Managing Loss, Adult People experience loss in many different ways throughout their lives. Events such as moving, changing jobs, and losing friends can create a sense of loss. The loss may be as serious as a major health change, divorce, death of a pet, or death of a loved one. All of these types of loss are likely to create a physical and emotional reaction known as grief. Grief is the result of a major change or an absence of something or someone that you count on. Grief is a normal reaction to loss. A variety of factors can affect your grieving experience, including:  The nature of your loss.  Your relationship to what or whom you lost.  Your understanding of grief and how to manage it.  Your support system. How to manage lifestyle changes Keep to your normal routine as much as possible.  If you have trouble focusing or doing normal activities, it is acceptable to take some time away from your normal routine.  Spend time with friends and loved ones.  Eat a healthy diet, get plenty of sleep, and rest when you feel tired. How to recognize changes  The way that you deal with your grief will affect your ability to function as you normally do. When grieving, you may experience these changes:  Numbness, shock, sadness, anxiety, anger, denial, and guilt.  Thoughts about death.  Unexpected crying.  A physical sensation of emptiness in your stomach.  Problems sleeping and eating.  Tiredness (fatigue).  Loss of interest in normal activities.  Dreaming about or imagining seeing the person who died.  A need to remember what or whom you lost.  Difficulty thinking about anything other than your loss for a period of time.  Relief. If you have been expecting the loss for a while, you may  feel a sense of relief when it happens. Follow these instructions at home:  Activity Express your feelings in healthy ways, such as:  Talking with others about your loss. It may be helpful to find others who have had a similar loss, such as a support group.  Writing down your feelings in a journal.  Doing physical activities to release stress and emotional energy.  Doing creative activities like painting, sculpting, or playing or listening to music.  Practicing resilience. This is the ability to recover and adjust after facing challenges. Reading some resources that encourage resilience may help you to learn ways to practice those behaviors. General instructions  Be patient with yourself and others. Allow the grieving process to happen, and remember that grieving takes time. ? It is likely that you may never feel completely done with some grief. You may find a way to move on while still cherishing memories and feelings about your loss. ? Accepting your loss is a process. It can take months or longer to adjust.  Keep all follow-up visits as told by your health care provider. This is important. Where to find support To get support for managing loss:  Ask your health care provider for help and recommendations, such as grief counseling or therapy.  Think about joining a support group for people who are managing a loss. Where to find more information You can find more information about managing loss from:  American Society of Clinical Oncology: www.cancer.net  American Psychological Association: TVStereos.ch Contact a health care provider if:  Your grief is extreme and keeps getting worse.  You have ongoing grief that does not improve.  Your body shows symptoms of grief, such as illness.  You feel depressed, anxious, or lonely. Get help right away if:  You have thoughts about hurting yourself or others. If you ever feel like you may hurt yourself or others, or have thoughts  about taking your own life, get help right away. You can go to your nearest emergency department or call:  Your local emergency services (911 in the [place]  A suicide crisis helpline, such as the [place] at (210)672-7579. This is open 24 hours a day. Summary  Grief is the result of a major change or an absence of someone or something that you count on. Grief is a normal reaction to loss.  The depth of grief and the period of recovery depend on the type of loss and your ability to adjust to the change and process your feelings.  Processing grief requires patience and a willingness to accept your feelings and talk about your loss with people who are supportive.  It is important to find resources that work for you and to realize that people experience grief differently. There is not one grieving process that works for everyone in the same way.  Be aware that when grief becomes extreme, it can lead to more severe issues like isolation, depression, anxiety, or suicidal thoughts. Talk with your health care provider if you have any of these issues. This information is not intended to replace advice given to you by your health care provider. Make sure you discuss any questions you have with your health care provider. Document Released: 01/24/2017 Document Revised: 11/14/2018 Document Reviewed: 01/24/2017 Elsevier Patient Education  [place].

## 2019-06-04 DIAGNOSIS — R350 Frequency of micturition: Secondary | ICD-10-CM | POA: Diagnosis not present

## 2019-06-22 DIAGNOSIS — M545 Low back pain: Secondary | ICD-10-CM | POA: Diagnosis not present

## 2019-06-22 DIAGNOSIS — R2681 Unsteadiness on feet: Secondary | ICD-10-CM | POA: Diagnosis not present

## 2019-06-23 ENCOUNTER — Other Ambulatory Visit: Payer: Self-pay

## 2019-06-23 ENCOUNTER — Ambulatory Visit (INDEPENDENT_AMBULATORY_CARE_PROVIDER_SITE_OTHER): Payer: Medicare Other | Admitting: Sports Medicine

## 2019-06-23 ENCOUNTER — Encounter: Payer: Self-pay | Admitting: Sports Medicine

## 2019-06-23 DIAGNOSIS — M79672 Pain in left foot: Secondary | ICD-10-CM

## 2019-06-23 DIAGNOSIS — M779 Enthesopathy, unspecified: Secondary | ICD-10-CM | POA: Diagnosis not present

## 2019-06-23 DIAGNOSIS — M79671 Pain in right foot: Secondary | ICD-10-CM | POA: Diagnosis not present

## 2019-06-23 DIAGNOSIS — M2550 Pain in unspecified joint: Secondary | ICD-10-CM | POA: Diagnosis not present

## 2019-06-23 MED ORDER — DICLOFENAC SODIUM 1 % TD GEL: 4.0000 g | Freq: Four times a day (QID) | TRANSDERMAL | 0 refills | Status: DC

## 2019-06-23 MED ORDER — TRIAMCINOLONE ACETONIDE 0.5 % EX OINT: 1.0000 "application " | TOPICAL_OINTMENT | Freq: Two times a day (BID) | CUTANEOUS | 0 refills | Status: DC

## 2019-06-23 NOTE — Progress Notes (Signed)
Subjective: Amy [name] is a 73 y.o. female patient who returns to office for evaluation of bilateral foot pain patient reports that she hurts all over and reports that she has been resting and icing every other day states that the prednisone she received last visit has not helped but has been using heel lifts that seem to give her a little bit of relief and has cam boot to use as needed reports new problem of some itching noted to both feet and reports that she is in physical therapy and is struggling with therapy with her knees and has not worked up to getting therapy for her feet or lower extremities.  Patient denies any other symptoms such as redness or warmth nausea vomiting or fever or chills.  Patient Active Problem List   Diagnosis Date Noted  . Uncontrolled daytime somnolence 04/12/2019  . Snoring 01/22/2019  . Morbid obesity with body mass index of 45.0-49.9 in adult [facility]) 01/22/2019  . OSA (obstructive sleep apnea) 01/22/2019  . Wound dehiscence 07/22/2016  . Dyspnea 07/21/2016  . Acute encephalopathy 07/21/2016  . Anemia 07/21/2016  . Pancreatitis 07/02/2016  . SBO (small bowel obstruction) ([place]) 07/02/2016  . Long term current use of opiate analgesic 06/26/2016  . Long term prescription opiate use 06/26/2016  . Opiate use 06/26/2016  . Encounter for therapeutic drug level monitoring 06/26/2016  . Encounter for pain management planning 06/26/2016  . Chronic pain 06/26/2016  . Disturbance of skin sensation 06/26/2016  . Chronic low back pain (Location of Primary Source of Pain) (Bilateral) (R>L) 06/26/2016  . Chronic hip pain (Location of Secondary source of pain) (Bilateral) (R>L) 06/26/2016  . 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 Chronic shoulder pain (Location of Tertiary source of pain) (Bilateral) (R>L) 06/26/2016  . 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 Chronic knee pain (Bilateral) (R>L) 06/26/2016  . 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 Chronic neck pain (Right) 06/26/2016  . Lumbar spondylosis 06/26/2016  . Lumbar facet syndrome 06/26/2016  . Lumbar facet hypertrophy 06/26/2016  . 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 Neurogenic pain 06/26/2016  . Occipital headaches 06/26/2016  . 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 History of lumbar fusion 06/26/2016  . 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  . Neuropathic pain of both legs 10/13/2015  . Constipation 10/13/2015  . Complete rotator cuff tear of left shoulder 05/05/2014  . Depression 08/06/2013  . Hypokalemia 08/06/2013  . GERD (gastroesophageal reflux disease) 08/06/2013  . Spinal stenosis of lumbar region 08/06/2013  . Bladder spasm 08/06/2013  . Morbid obesity ([place]) 02/03/2013  . Lipoma of buttock s/p excision 02/20/2013 02/03/2013  . DYSPNEA 01/23/2008  . Obstructive sleep apnea 09/12/2007  . HTN (hypertension) 09/12/2007  . Allergic rhinitis 09/12/2007  . SLEEPINESS 09/12/2007  . ANGINA, HX OF 09/12/2007    Current Outpatient Medications on File Prior to Visit  Medication Sig Dispense Refill  . acetaminophen (TYLENOL) 500 MG tablet Take 1,000 mg by mouth every 6 (six) hours as needed for moderate pain.     Marland Kitchen azelaic acid (AZELEX) 20 % cream Apply 1 application topically 2 (two) times daily. After skin is thoroughly washed and patted dry, gently but thoroughly massage a thin film of azelaic acid cream into the affected area twice daily, in the morning and evening.    . B Complex Vitamins (B COMPLEX PO) Take by mouth.    . calcium-vitamin D (OSCAL WITH D)  500-200 MG-UNIT tablet Take 1 tablet by mouth daily with breakfast.    . ciprofloxacin (CIPRO) 250 MG tablet TK 1 T PO Q 12 H FOR 7 DAYS    . cloNIDine (CATAPRES) 0.1 MG tablet Take 1 tablet (0.1 mg total) by mouth 3 (three) times daily. 60 tablet 11  . diphenhydrAMINE (BENADRYL) 25 MG tablet Take 25 mg by mouth 2 (two)  times daily as needed for allergies.    Marland Kitchen estradiol (ESTRACE) 0.1 MG/GM vaginal cream Place 1 Applicatorful vaginally 2 (two) times a week. (Patient taking differently: Place 1 Applicatorful vaginally 2 (two) times a week. Tuesday and Thursday) 42.5 g 12  . fluticasone (FLONASE) 50 MCG/ACT nasal spray Place 1 spray into both nostrils daily.  2  . gabapentin (NEURONTIN) 300 MG capsule Take 300 mg by mouth 2 (two) times daily.    . hydrALAZINE (APRESOLINE) 50 MG tablet Take 50 mg by mouth 3 (three) times daily.    . hydrochlorothiazide (HYDRODIURIL) 12.5 MG tablet Take 12.5 mg by mouth daily.    Marland Kitchen losartan (COZAAR) 100 MG tablet TK 1 T PO ONCE D    . meloxicam (MOBIC) 15 MG tablet Take 15 mg by mouth daily.    . Menthol-Methyl Salicylate (MUSCLE RUB EX) Apply 1 application topically 2 (two) times daily as needed (pain in neck and back).     . methocarbamol (ROBAXIN) 500 MG tablet Take 500 mg by mouth 2 (two) times daily as needed for muscle spasms.    . metoprolol (LOPRESSOR) 50 MG tablet Take 1 tablet (50 mg total) by mouth 2 (two) times daily.    Marland Kitchen NIFEdipine (PROCARDIA) 10 MG capsule Take 10 mg by mouth 3 (three) times daily.    . ondansetron (ZOFRAN-ODT) 4 MG disintegrating tablet DIS 1 T ON THE TONGUE TID PRN    . OVER THE COUNTER MEDICATION Take 2 tablets by mouth daily. Swiss Kriss OTC herbal laxative    . pantoprazole (PROTONIX) 40 MG tablet Take 80 mg by mouth daily.   0  . PARoxetine (PAXIL) 30 MG tablet Take 30 mg by mouth daily.    . potassium chloride SA (K-DUR,KLOR-CON) 20 MEQ tablet Take 20 mEq by mouth daily.    . tapentadol (NUCYNTA) 50 MG tablet Take 50 mg by mouth every 6 (six) hours as needed for moderate pain.    . vitamin B-12 (CYANOCOBALAMIN) 1000 MCG tablet Take 1,000 mcg by mouth daily.    . vitamin E (VITAMIN E) 400 UNIT capsule Take 400 Units by mouth daily.     No current facility-administered medications on file prior to visit.     Allergies  Allergen Reactions  .  Latex Other (See Comments)    Sores, blisters - reaction to gloves  . Crestor [Rosuvastatin Calcium]     Myalgias  . Penicillins Other (See Comments)    UNSPECIFIED REACTION  "Has taken amoxicillin and ampicillin with no reaction" Has patient had a PCN reaction causing immediate rash, facial/tongue/throat swelling, SOB or lightheadedness with hypotension: Unknown Has patient had a PCN reaction causing severe rash involving mucus membranes or skin necrosis: Unknown Has patient had a PCN reaction that required hospitalization: Unknown Has patient had a PCN reaction occurring within the last 10 years: No   . Tetanus Toxoids Rash  . Tomato Rash    Objective:  General: Alert and oriented x3 in no acute distress  Dermatology: Scaly rash noted to medial aspects of both feet.  No open lesions bilateral lower  extremities, no webspace macerations, no ecchymosis bilateral, all nails x 10 are well manicured.  Vascular: Dorsalis Pedis and Posterior Tibial pedal pulses palpable, Capillary Fill Time 3 seconds,(+) pedal hair growth bilateral, trace edema bilateral lower extremities with R>L, Temperature gradient within normal limits. No acute ischemia.   Neurology: Johney Maine sensation intact via light touch bilateral.   Musculoskeletal: There is tenderness with palpation at both right>left foot and ankle and lower legs even at calf, Diffusely. No pain with calf compression bilateral. Range of motion within normal limits with mild guarding on Right>left ankle.  Assessment and Plan: Problem List Items Addressed This Visit    None    Visit Diagnoses    Tendonitis    -  Primary   Right foot pain       Left foot pain       Arthralgia, unspecified joint         -Complete examination performed -Discussed with patient symptoms of diffuse pain all over likely complicated by other underlying medical issues -Prescribed Kenalog cream to use to rash at both feet and advised good hygiene habits -Dispenced  Achilles sleeve the patient to use bilateral -Advised patient rest ice elevation and continue with physical therapy and encourage patient to consider having therapy done for her lower extremities as well -Continue with good supportive shoes and heel lifts daily -Patient to return to office as needed or sooner if condition worsens.  Landis Martins, DPM

## 2019-06-25 DIAGNOSIS — R2681 Unsteadiness on feet: Secondary | ICD-10-CM | POA: Diagnosis not present

## 2019-06-25 DIAGNOSIS — M545 Low back pain: Secondary | ICD-10-CM | POA: Diagnosis not present

## 2019-06-29 DIAGNOSIS — M545 Low back pain: Secondary | ICD-10-CM | POA: Diagnosis not present

## 2019-06-29 DIAGNOSIS — R2681 Unsteadiness on feet: Secondary | ICD-10-CM | POA: Diagnosis not present

## 2019-07-02 DIAGNOSIS — R2681 Unsteadiness on feet: Secondary | ICD-10-CM | POA: Diagnosis not present

## 2019-07-02 DIAGNOSIS — M129 Arthropathy, unspecified: Secondary | ICD-10-CM | POA: Insufficient documentation

## 2019-07-02 DIAGNOSIS — M5440 Lumbago with sciatica, unspecified side: Secondary | ICD-10-CM | POA: Diagnosis not present

## 2019-07-02 DIAGNOSIS — M545 Low back pain: Secondary | ICD-10-CM | POA: Diagnosis not present

## 2019-07-06 ENCOUNTER — Other Ambulatory Visit: Payer: Self-pay | Admitting: Family Medicine

## 2019-07-06 DIAGNOSIS — M79609 Pain in unspecified limb: Secondary | ICD-10-CM

## 2019-07-06 DIAGNOSIS — Z23 Encounter for immunization: Secondary | ICD-10-CM | POA: Diagnosis not present

## 2019-07-06 DIAGNOSIS — R6889 Other general symptoms and signs: Secondary | ICD-10-CM

## 2019-07-07 DIAGNOSIS — M48062 Spinal stenosis, lumbar region with neurogenic claudication: Secondary | ICD-10-CM | POA: Diagnosis not present

## 2019-07-07 DIAGNOSIS — I10 Essential (primary) hypertension: Secondary | ICD-10-CM | POA: Diagnosis not present

## 2019-07-07 DIAGNOSIS — M961 Postlaminectomy syndrome, not elsewhere classified: Secondary | ICD-10-CM | POA: Diagnosis not present

## 2019-07-07 DIAGNOSIS — Z6841 Body Mass Index (BMI) 40.0 and over, adult: Secondary | ICD-10-CM | POA: Diagnosis not present

## 2019-07-09 DIAGNOSIS — M545 Low back pain: Secondary | ICD-10-CM | POA: Diagnosis not present

## 2019-07-09 DIAGNOSIS — R2681 Unsteadiness on feet: Secondary | ICD-10-CM | POA: Diagnosis not present

## 2019-07-10 ENCOUNTER — Ambulatory Visit
Admission: RE | Admit: 2019-07-10 | Discharge: 2019-07-10 | Disposition: A | Discharge status: Home / Self Care | Payer: Medicare Other | Source: Ambulatory Visit | Attending: Family Medicine | Admitting: Family Medicine

## 2019-07-10 DIAGNOSIS — R6889 Other general symptoms and signs: Secondary | ICD-10-CM

## 2019-07-10 DIAGNOSIS — M79605 Pain in left leg: Secondary | ICD-10-CM | POA: Diagnosis not present

## 2019-07-10 DIAGNOSIS — M79609 Pain in unspecified limb: Secondary | ICD-10-CM

## 2019-07-10 DIAGNOSIS — M79604 Pain in right leg: Secondary | ICD-10-CM | POA: Diagnosis not present

## 2019-07-16 DIAGNOSIS — R2681 Unsteadiness on feet: Secondary | ICD-10-CM | POA: Diagnosis not present

## 2019-07-16 DIAGNOSIS — M545 Low back pain: Secondary | ICD-10-CM | POA: Diagnosis not present

## 2019-07-17 DIAGNOSIS — M7989 Other specified soft tissue disorders: Secondary | ICD-10-CM | POA: Diagnosis not present

## 2019-07-17 DIAGNOSIS — M150 Primary generalized (osteo)arthritis: Secondary | ICD-10-CM | POA: Diagnosis not present

## 2019-07-17 DIAGNOSIS — G5603 Carpal tunnel syndrome, bilateral upper limbs: Secondary | ICD-10-CM | POA: Diagnosis not present

## 2019-07-17 DIAGNOSIS — M5136 Other intervertebral disc degeneration, lumbar region: Secondary | ICD-10-CM | POA: Diagnosis not present

## 2019-07-17 DIAGNOSIS — M2550 Pain in unspecified joint: Secondary | ICD-10-CM | POA: Diagnosis not present

## 2019-07-17 DIAGNOSIS — Z6841 Body Mass Index (BMI) 40.0 and over, adult: Secondary | ICD-10-CM | POA: Diagnosis not present

## 2019-07-17 DIAGNOSIS — M5030 Other cervical disc degeneration, unspecified cervical region: Secondary | ICD-10-CM | POA: Diagnosis not present

## 2019-07-17 DIAGNOSIS — G894 Chronic pain syndrome: Secondary | ICD-10-CM | POA: Diagnosis not present

## 2019-07-20 IMAGING — DX RIGHT FOOT COMPLETE - 3+ VIEW
3 series · 3 of 3 positions shown · non-contrast
Comparison: None.

CLINICAL DATA: Status post fall, right ankle pain

EXAM:
RIGHT FOOT COMPLETE - 3+ VIEW

[foot ap]
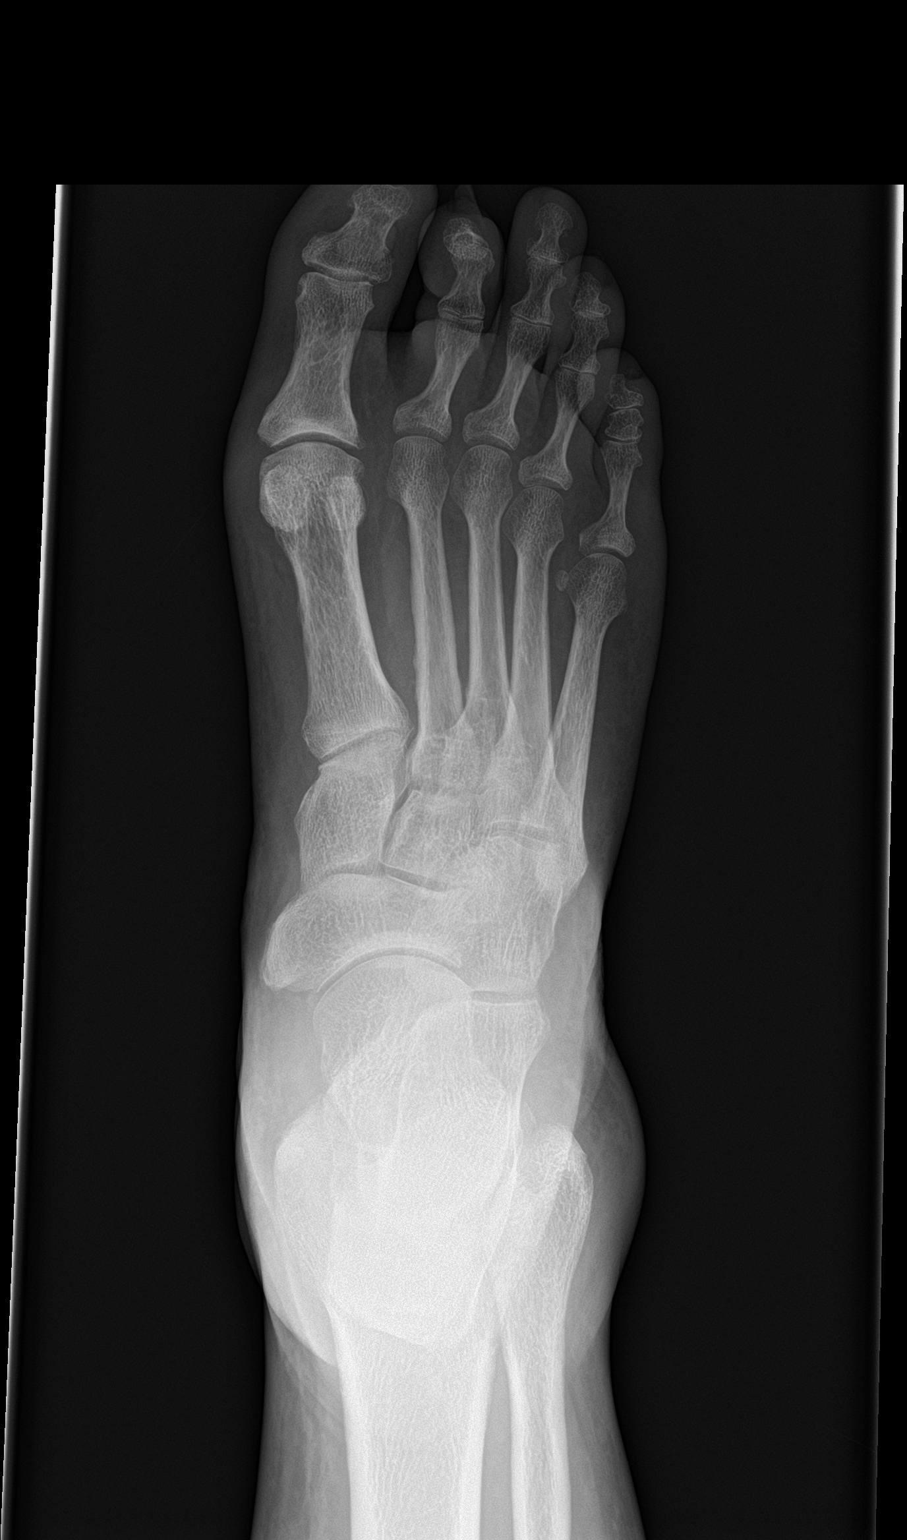

[foot obl]
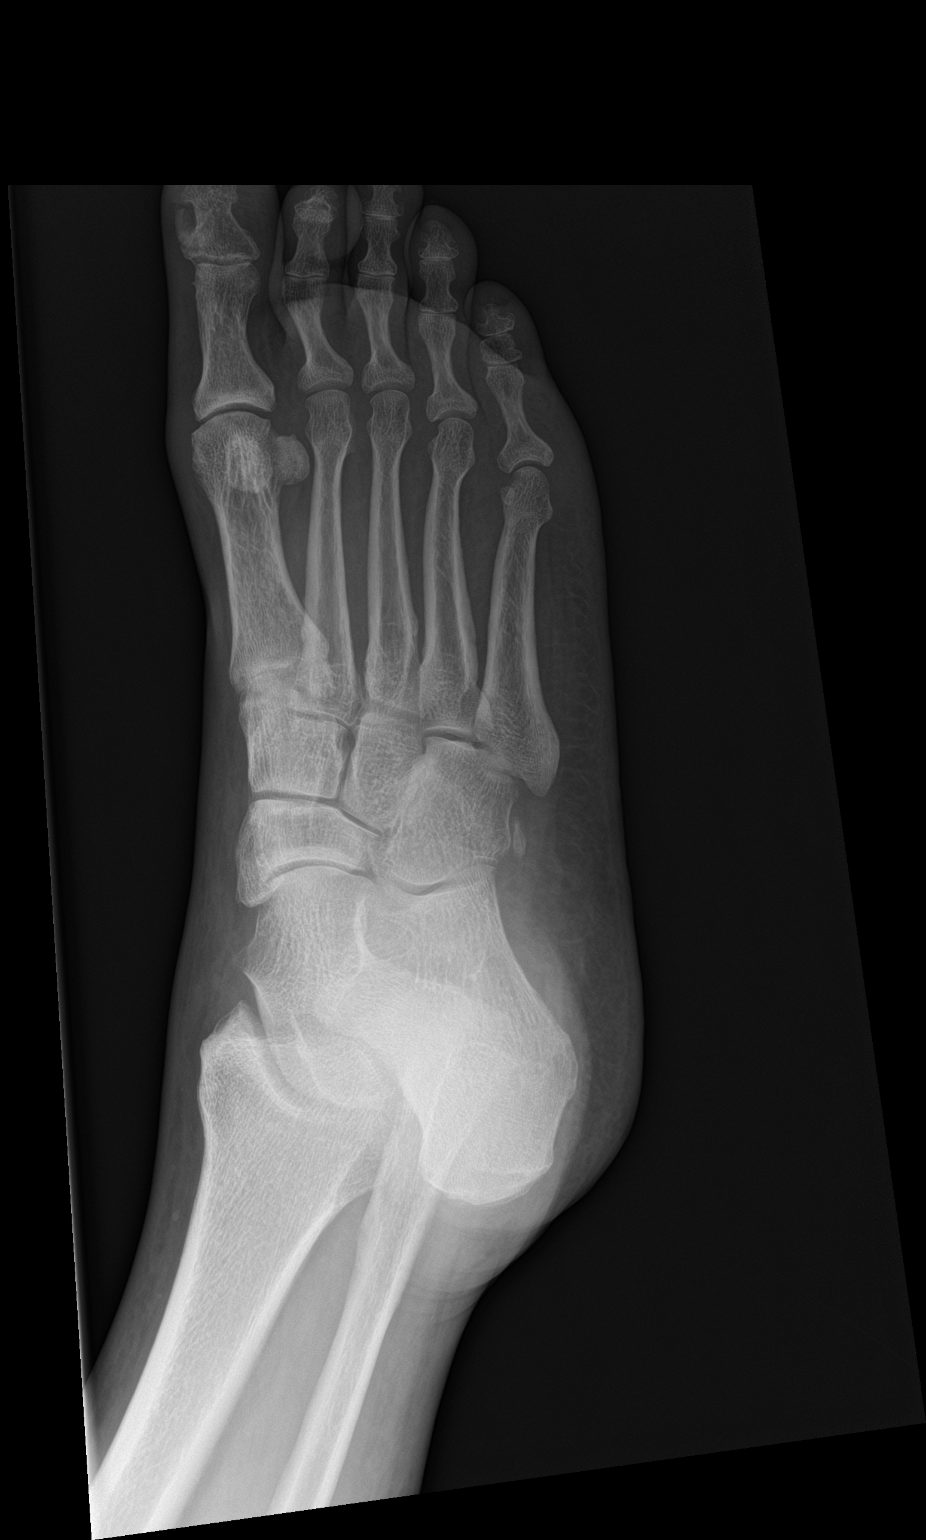

[foot lat]
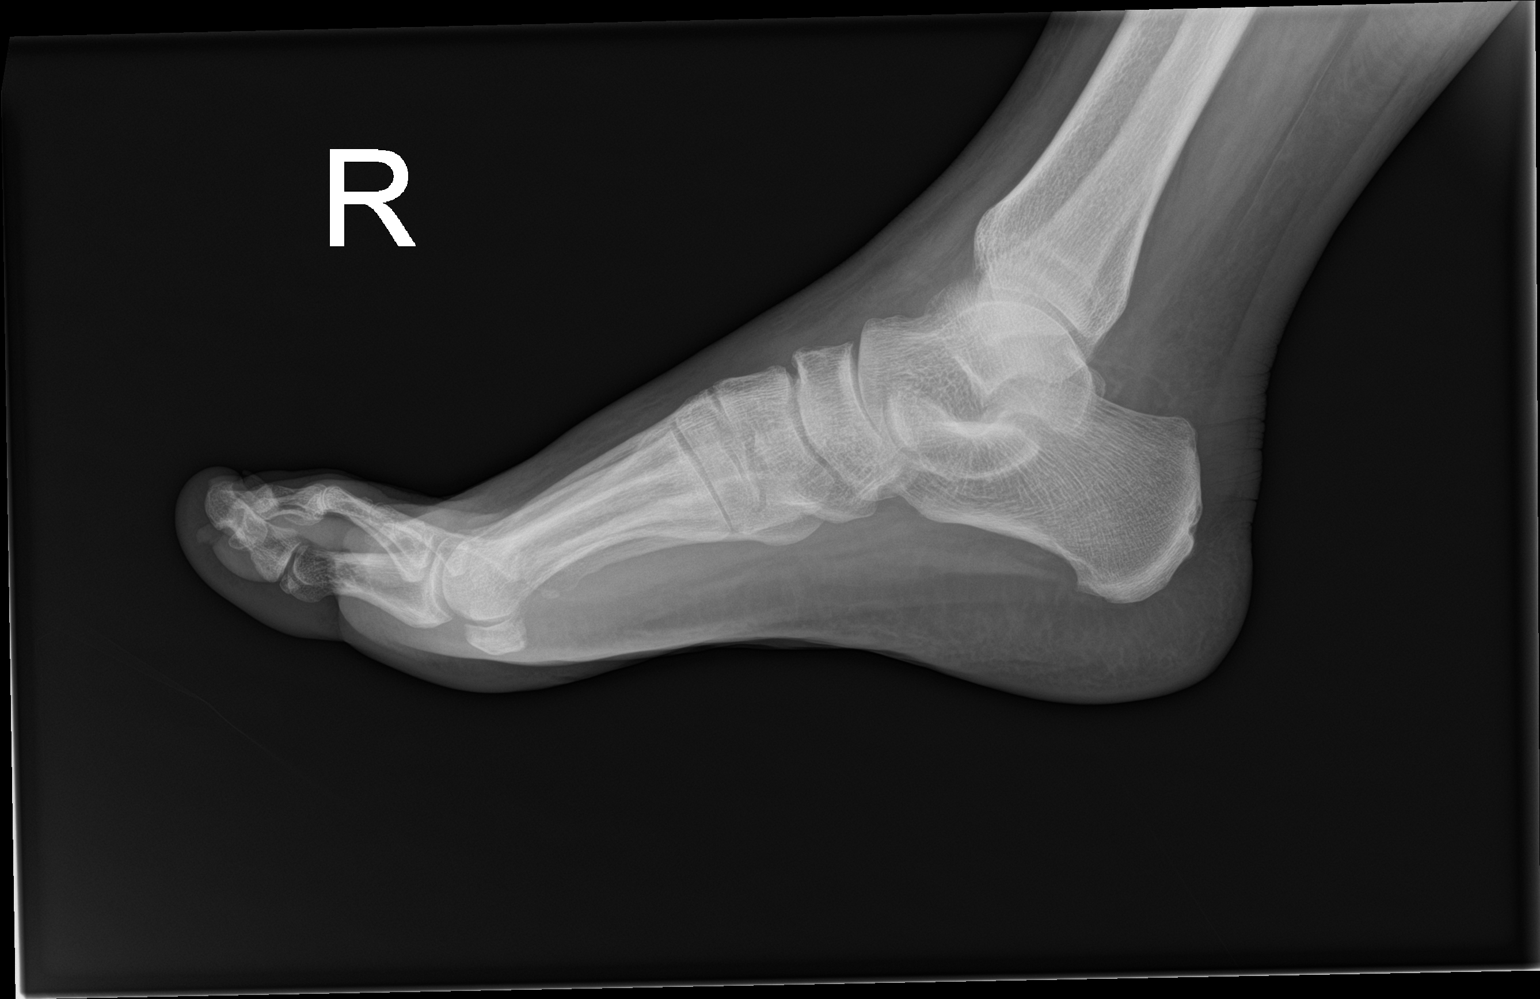

[3 of 3 positions shown; findings below may reference images not displayed]

FINDINGS: There is no evidence of fracture or dislocation. There is no
evidence of arthropathy or other focal bone abnormality. Soft
tissues are unremarkable.
IMPRESSION: No acute osseous injury of the right foot.

## 2019-07-20 IMAGING — CR RIGHT TIBIA AND FIBULA - 2 VIEW
4 series · 4 of 4 positions shown · non-contrast
Comparison: Knee x-ray 06/27/2016

CLINICAL DATA: Status post fall out of bed.  Right leg pain.

EXAM:
RIGHT TIBIA AND FIBULA - 2 VIEW

[tibia ap (1 of 2)]
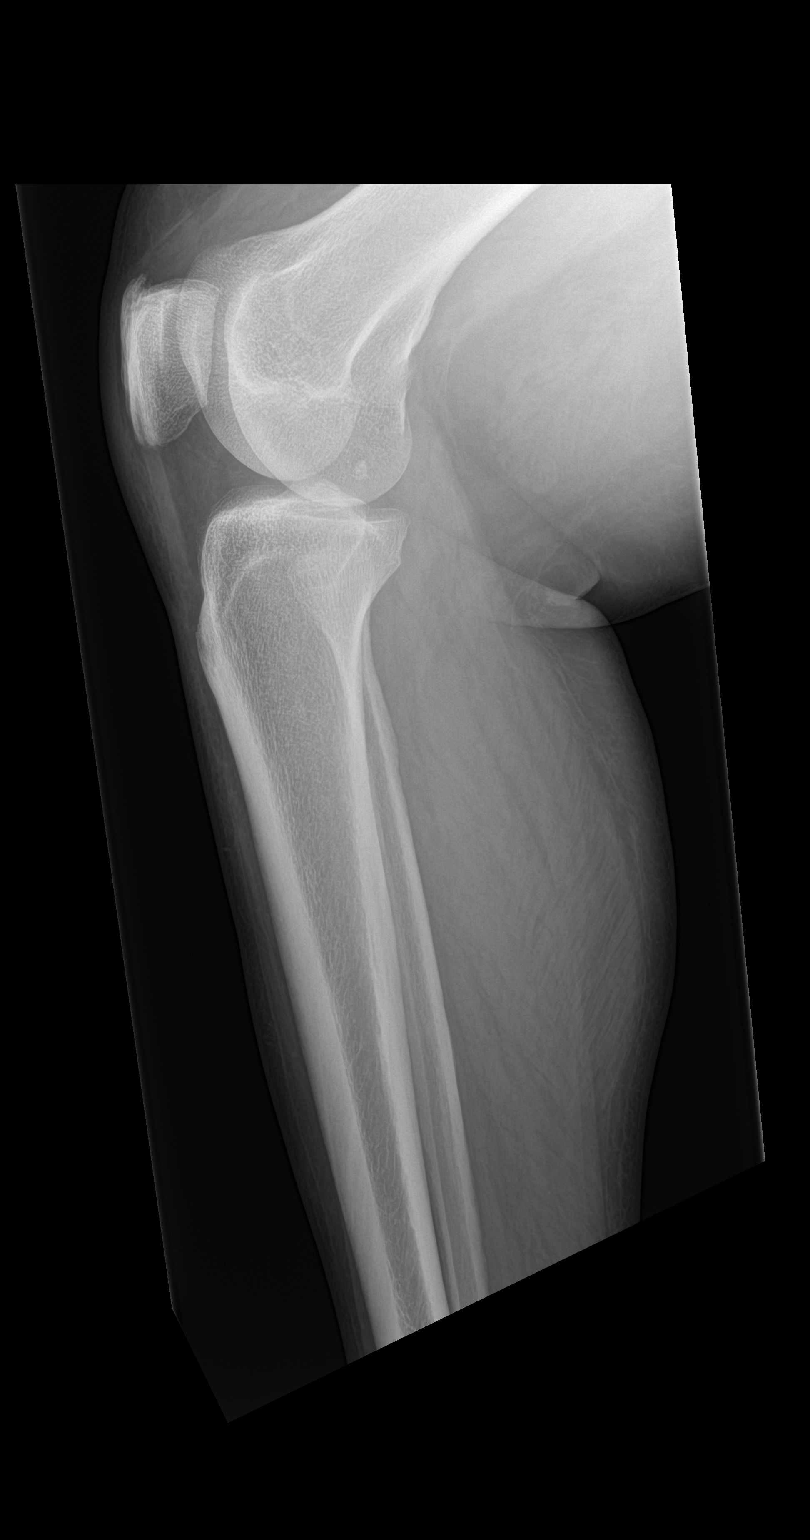

[tibia ap (2 of 2)]
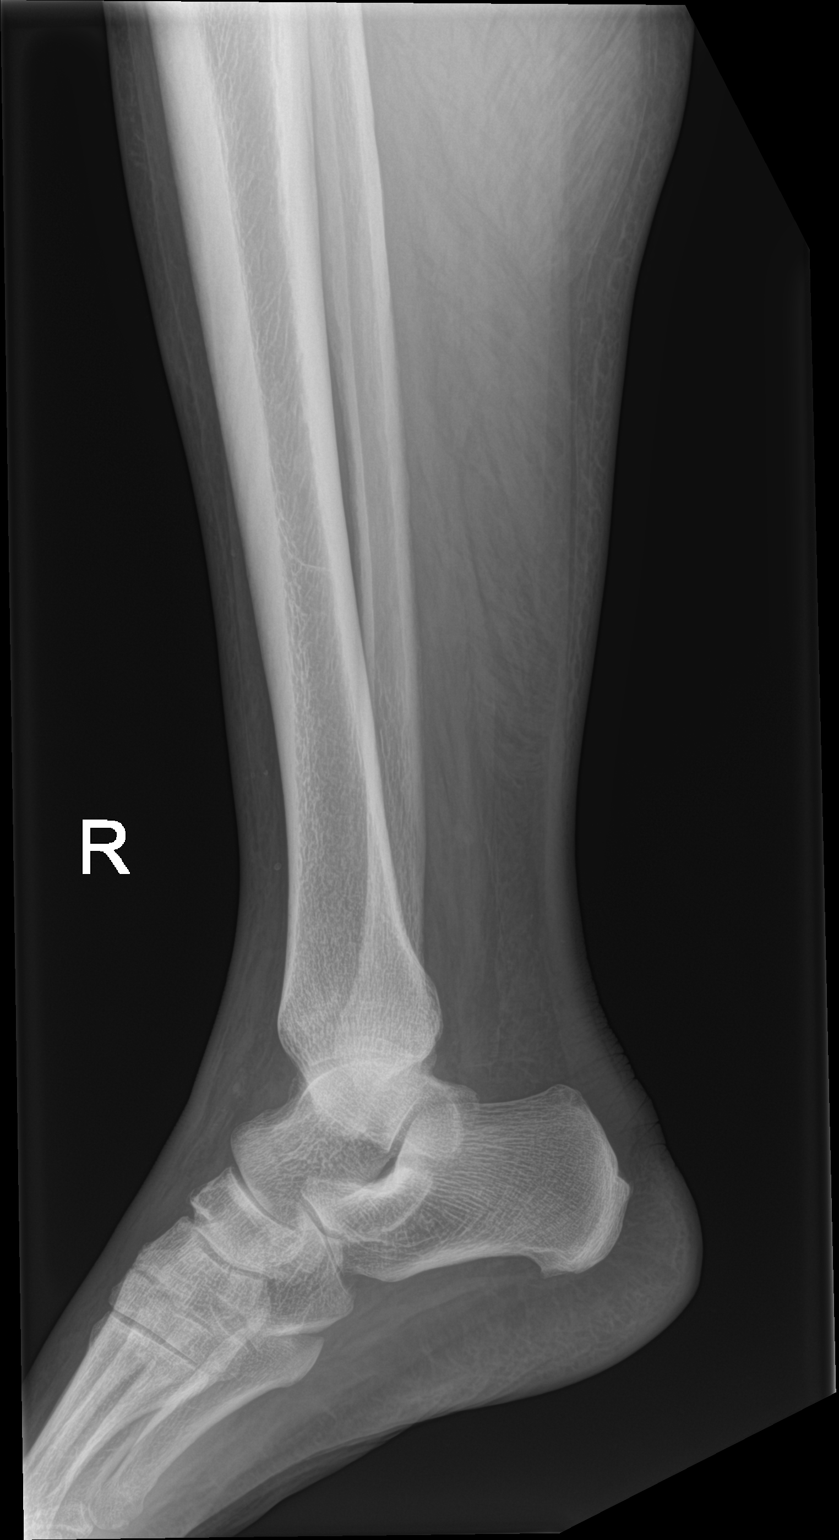

[tibia lat (1 of 2)]
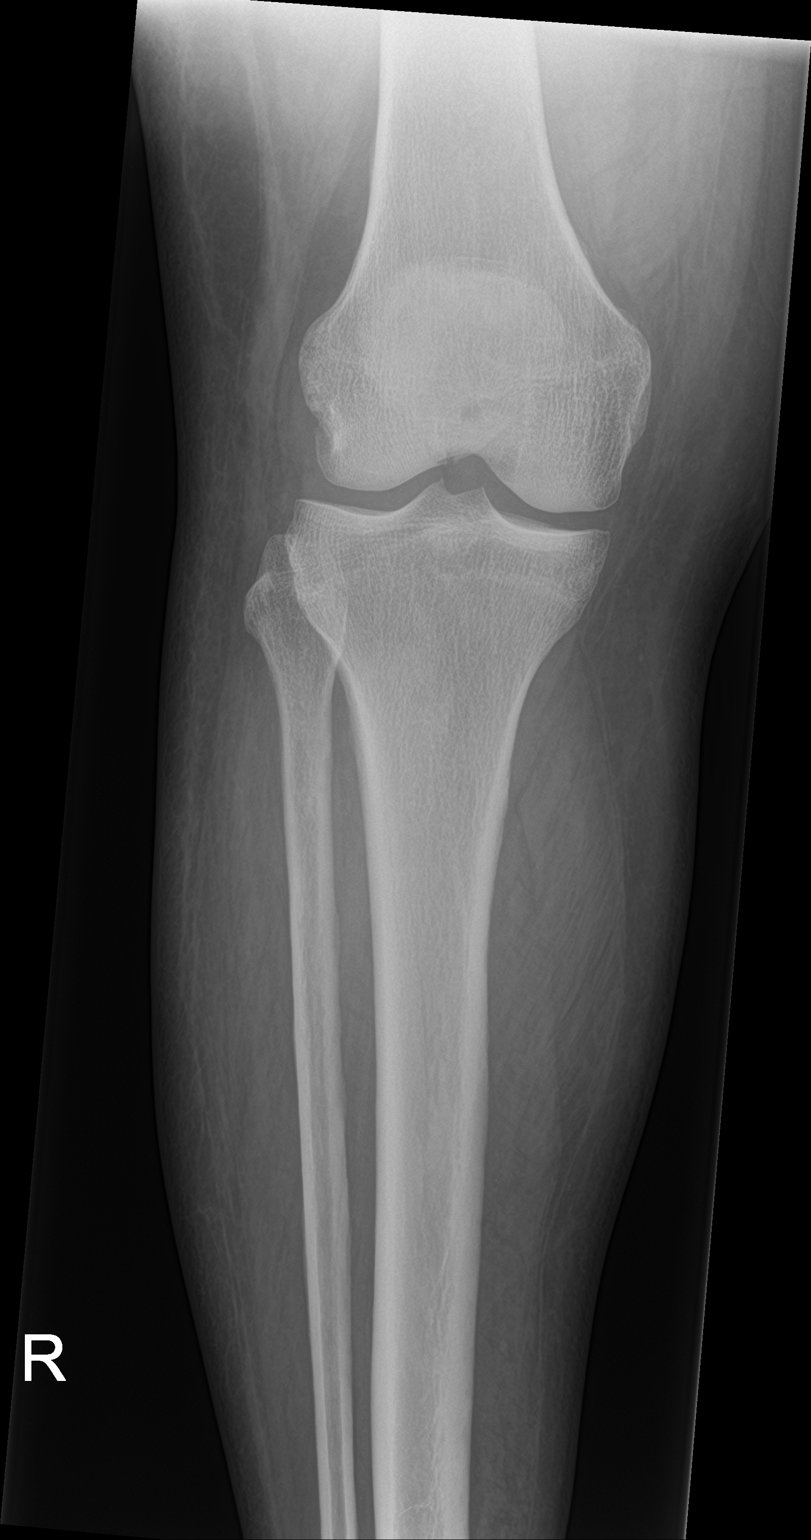

[tibia lat (2 of 2)]
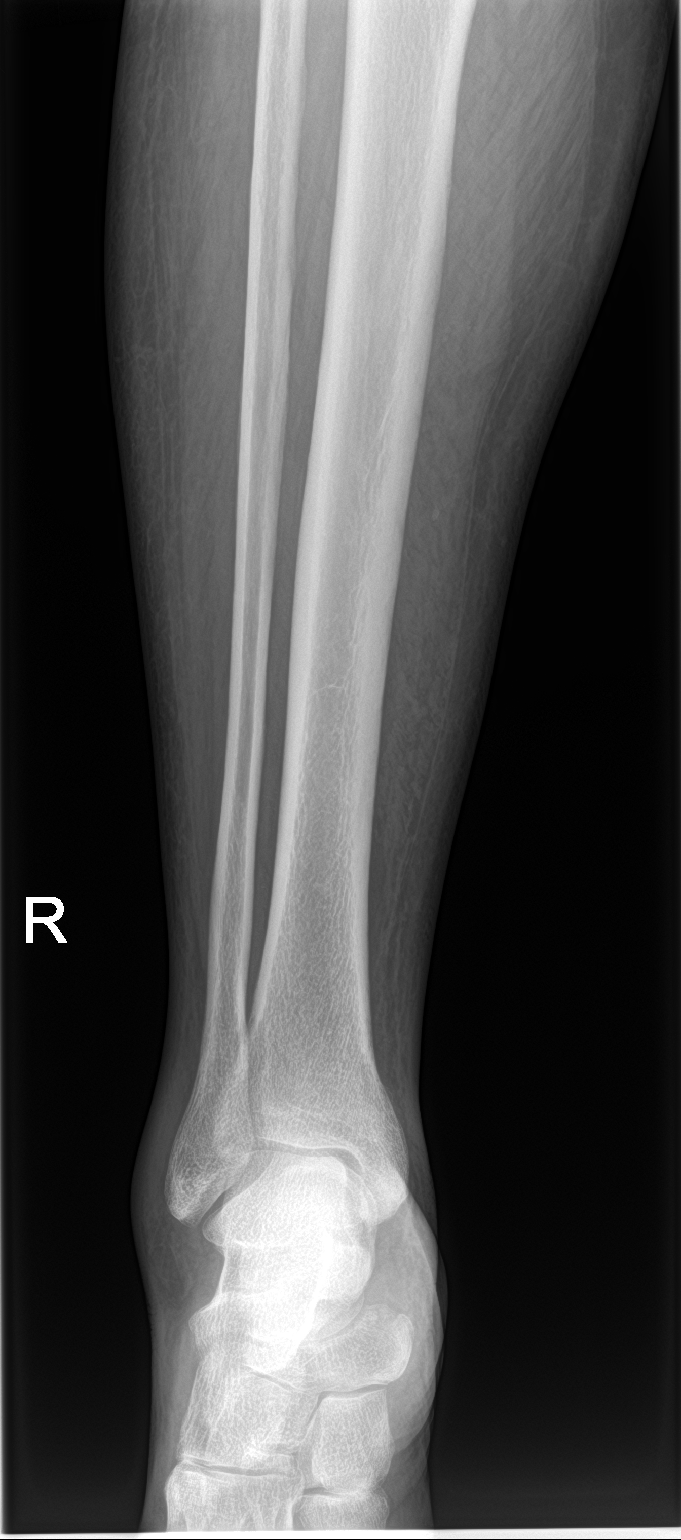

[4 of 4 positions shown; findings below may reference images not displayed]

FINDINGS: There is no evidence of fracture or other focal bone lesions. Soft
tissue swelling over the lateral malleolus.
IMPRESSION: No acute osseous injury of the right tibia and fibula.

## 2019-07-20 IMAGING — CR RIGHT ANKLE - COMPLETE 3+ VIEW
3 series · 3 of 3 positions shown · non-contrast
Comparison: None.

CLINICAL DATA: Pain following fall

EXAM:
RIGHT ANKLE - COMPLETE 3+ VIEW

[ankle ap]
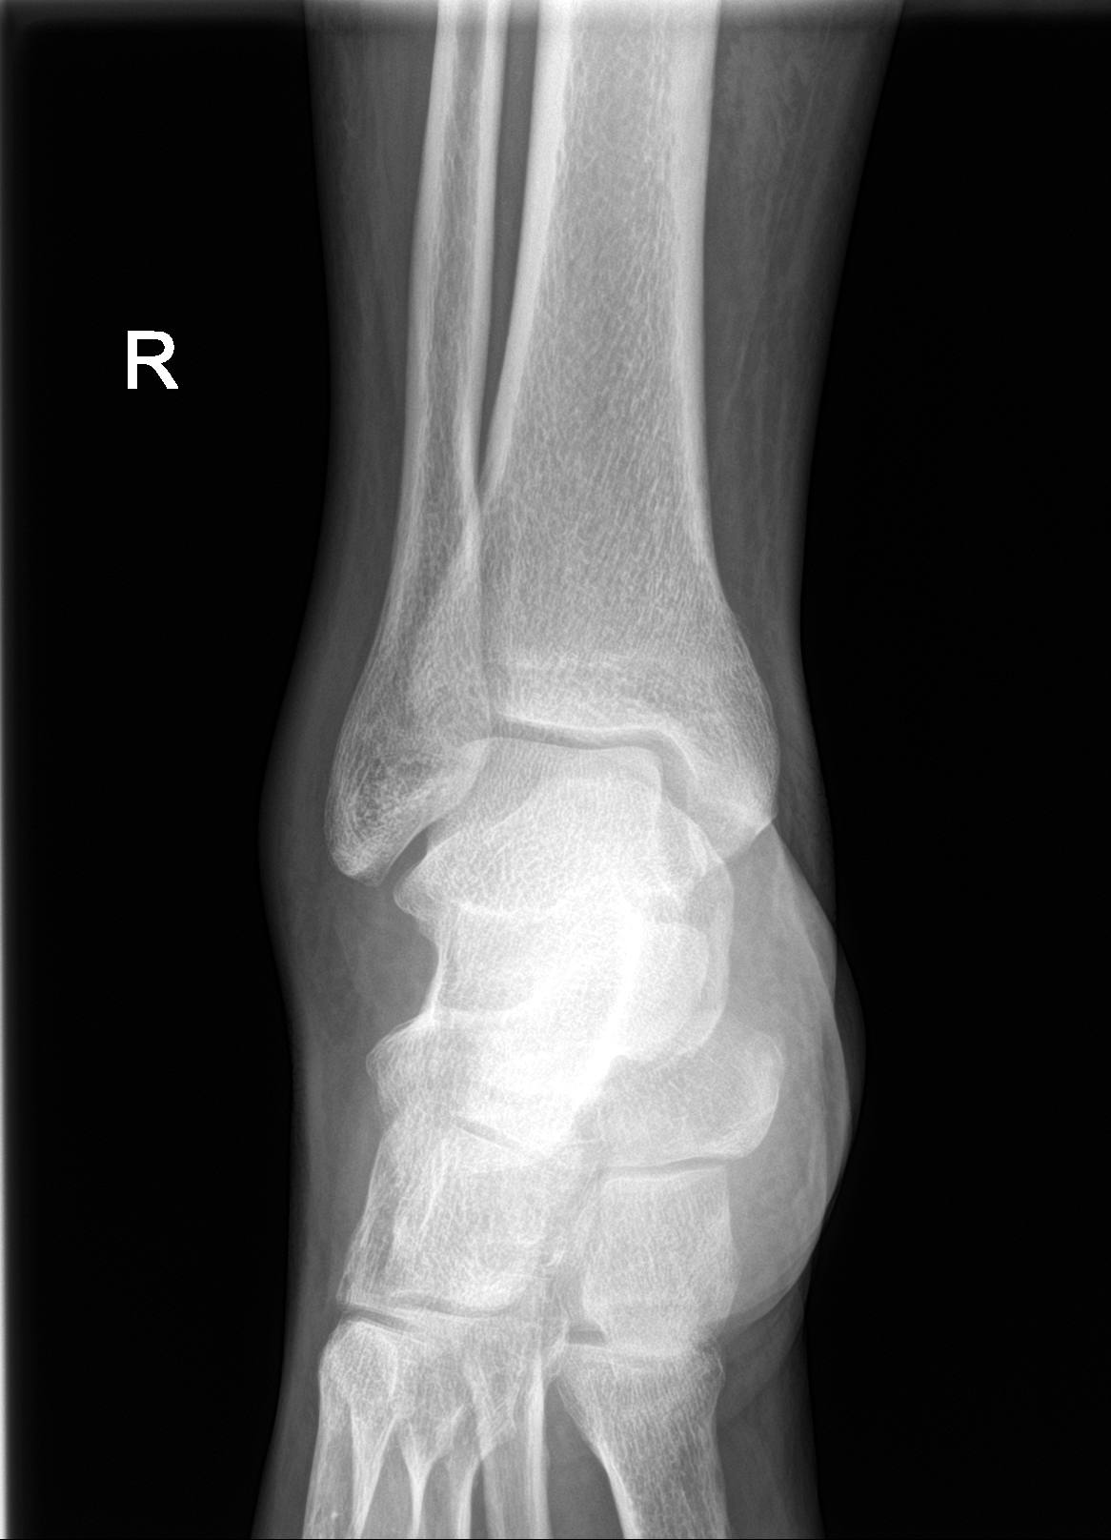

[ankle obl]
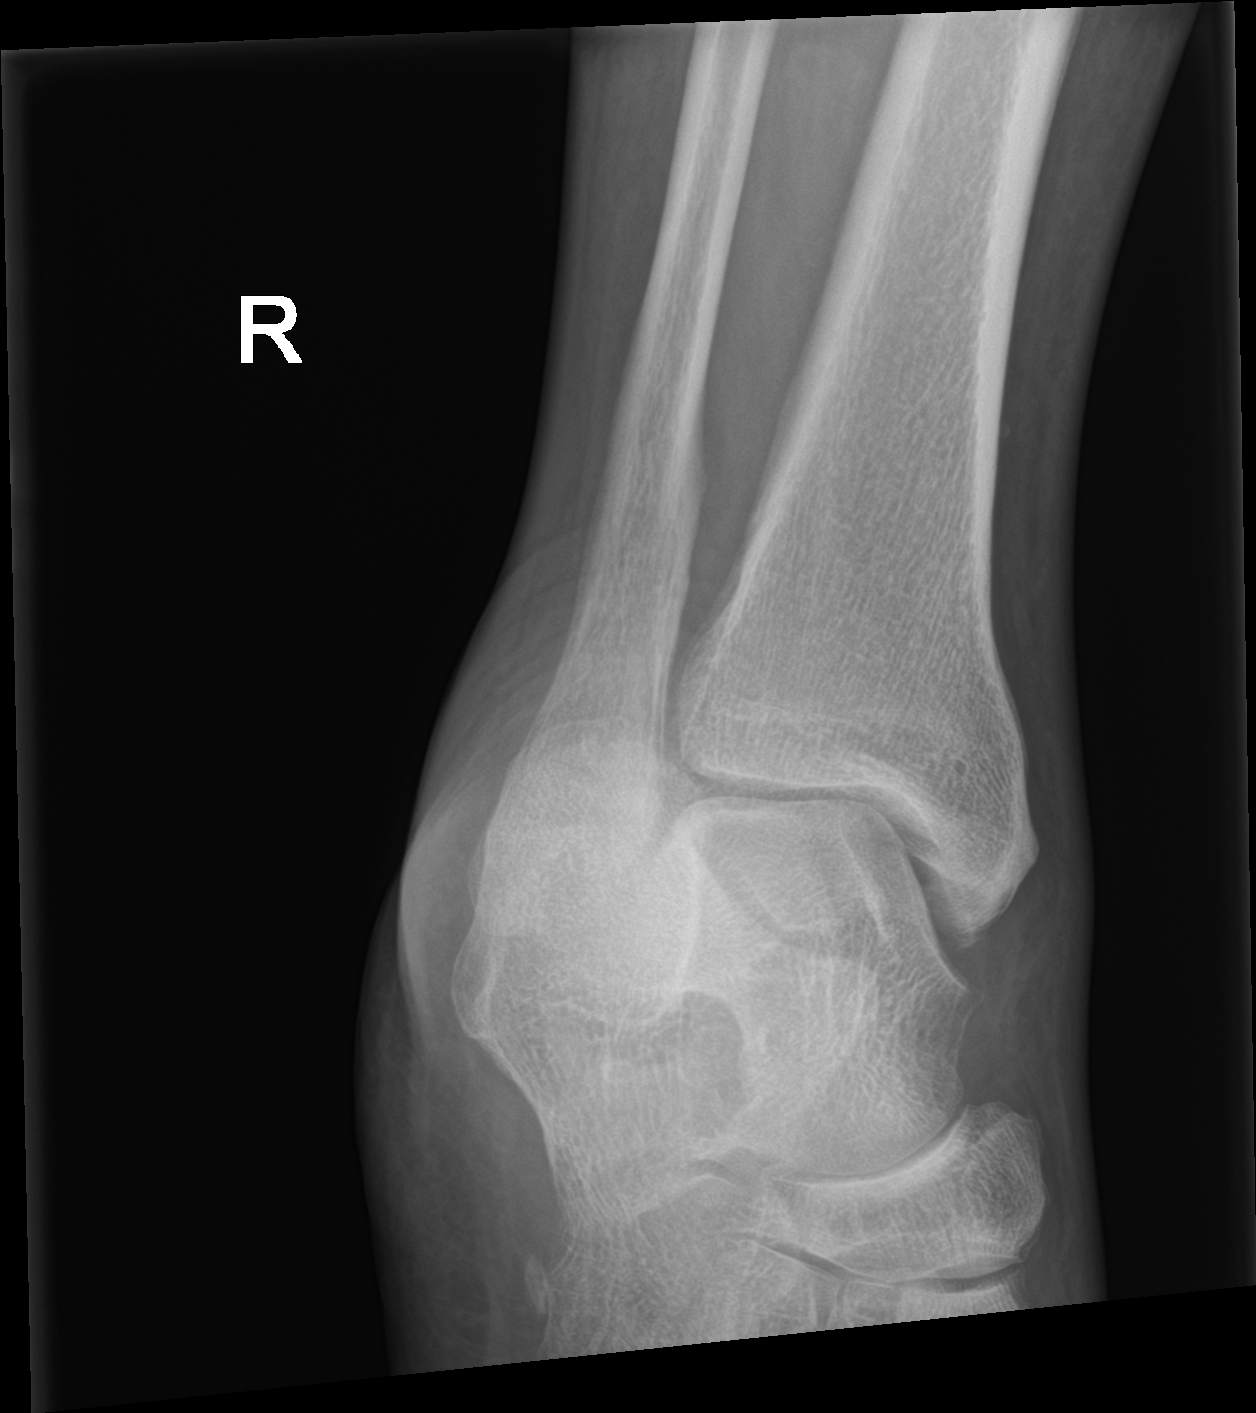

[ankle lat]
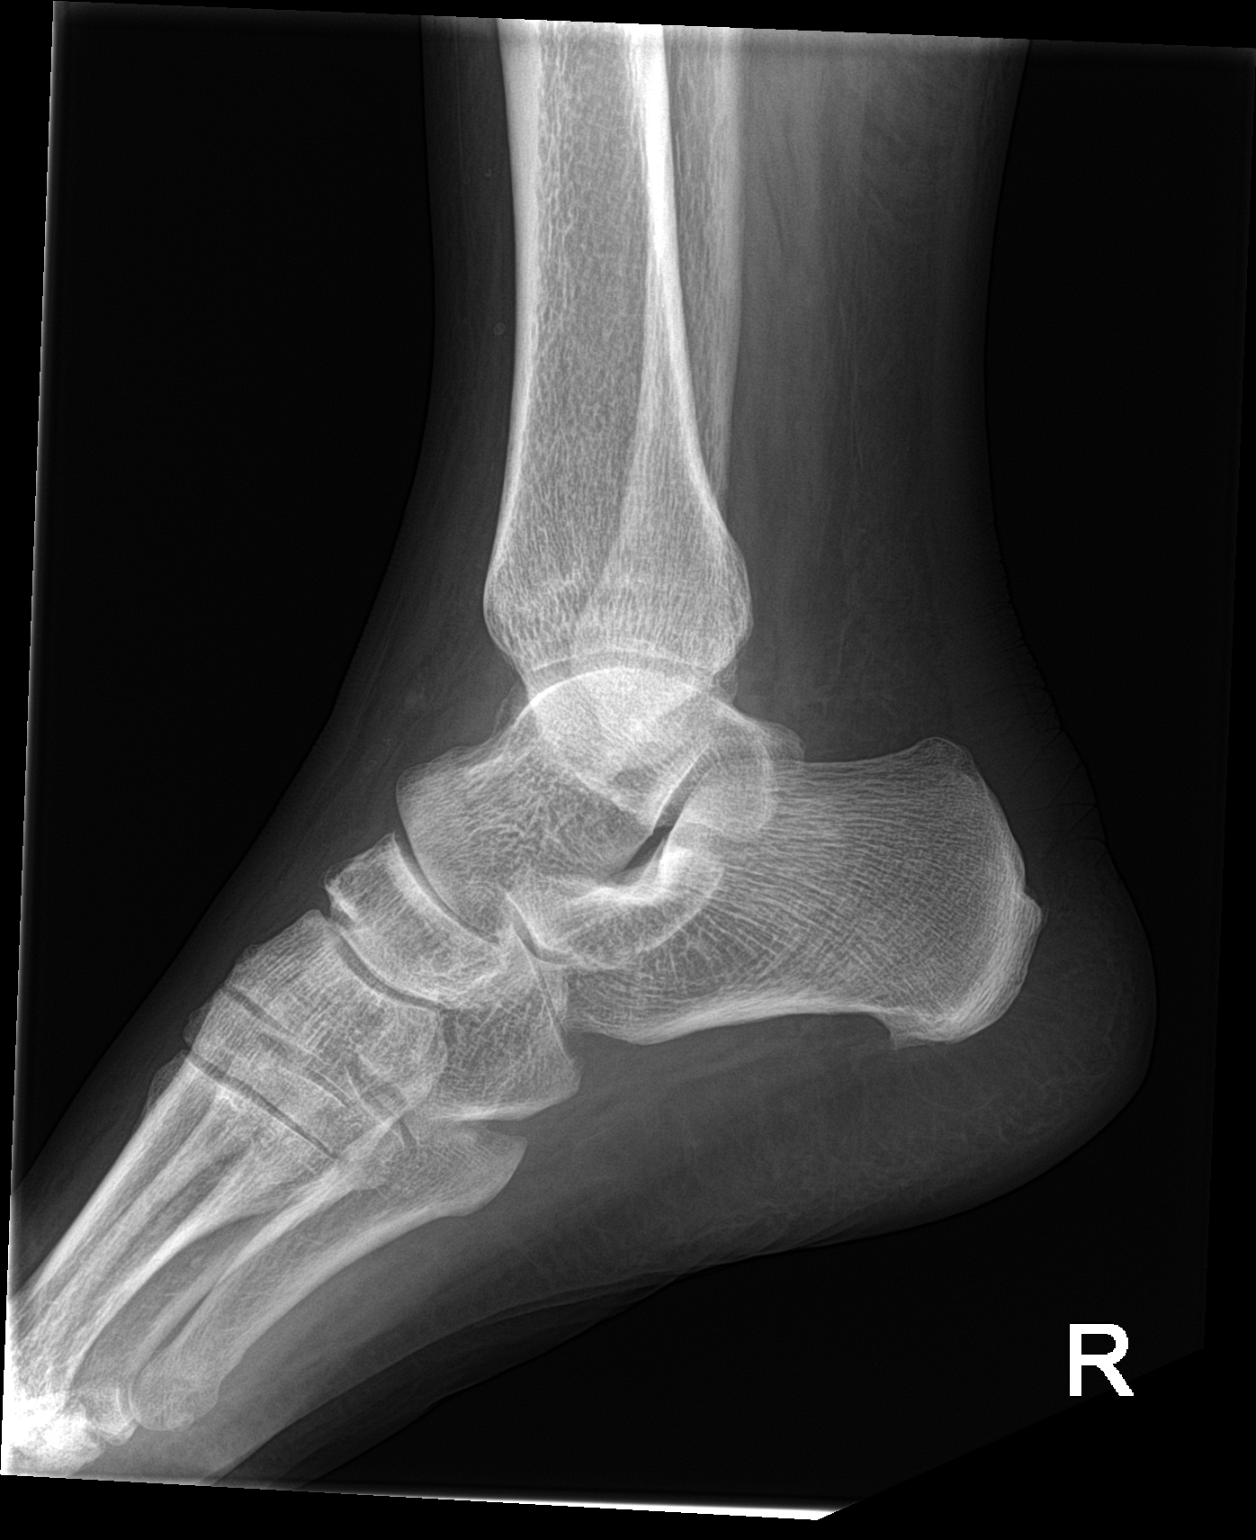

[3 of 3 positions shown; findings below may reference images not displayed]

FINDINGS: Frontal, oblique, and lateral views were obtained. There is mild
generalized soft tissue swelling. There is no evident acute fracture
or joint effusion. There is no appreciable joint space narrowing or
erosion. There is a minimal inferior calcaneal spur. Ankle mortise
appears intact.
IMPRESSION: Mild generalized soft tissue swelling. No evident fracture. Ankle
mortise appears intact. No appreciable arthropathy. Minimal inferior
calcaneal spur.

## 2019-07-21 DIAGNOSIS — M545 Low back pain: Secondary | ICD-10-CM | POA: Diagnosis not present

## 2019-07-21 DIAGNOSIS — R2681 Unsteadiness on feet: Secondary | ICD-10-CM | POA: Diagnosis not present

## 2019-07-23 DIAGNOSIS — M545 Low back pain: Secondary | ICD-10-CM | POA: Diagnosis not present

## 2019-07-23 DIAGNOSIS — R2681 Unsteadiness on feet: Secondary | ICD-10-CM | POA: Diagnosis not present

## 2019-08-03 DIAGNOSIS — G894 Chronic pain syndrome: Secondary | ICD-10-CM | POA: Diagnosis not present

## 2019-08-03 DIAGNOSIS — R5383 Other fatigue: Secondary | ICD-10-CM | POA: Diagnosis not present

## 2019-08-03 DIAGNOSIS — M5136 Other intervertebral disc degeneration, lumbar region: Secondary | ICD-10-CM | POA: Diagnosis not present

## 2019-08-03 DIAGNOSIS — R7989 Other specified abnormal findings of blood chemistry: Secondary | ICD-10-CM | POA: Diagnosis not present

## 2019-08-03 DIAGNOSIS — G5603 Carpal tunnel syndrome, bilateral upper limbs: Secondary | ICD-10-CM | POA: Diagnosis not present

## 2019-08-03 DIAGNOSIS — M5030 Other cervical disc degeneration, unspecified cervical region: Secondary | ICD-10-CM | POA: Diagnosis not present

## 2019-08-03 DIAGNOSIS — M2550 Pain in unspecified joint: Secondary | ICD-10-CM | POA: Diagnosis not present

## 2019-08-03 DIAGNOSIS — M150 Primary generalized (osteo)arthritis: Secondary | ICD-10-CM | POA: Diagnosis not present

## 2019-08-03 DIAGNOSIS — Z6841 Body Mass Index (BMI) 40.0 and over, adult: Secondary | ICD-10-CM | POA: Diagnosis not present

## 2019-08-03 DIAGNOSIS — R5381 Other malaise: Secondary | ICD-10-CM | POA: Diagnosis not present

## 2019-08-27 DIAGNOSIS — F332 Major depressive disorder, recurrent severe without psychotic features: Secondary | ICD-10-CM | POA: Diagnosis not present

## 2019-08-27 DIAGNOSIS — G4730 Sleep apnea, unspecified: Secondary | ICD-10-CM | POA: Diagnosis not present

## 2019-08-27 DIAGNOSIS — E669 Obesity, unspecified: Secondary | ICD-10-CM | POA: Diagnosis not present

## 2019-08-27 DIAGNOSIS — E7800 Pure hypercholesterolemia, unspecified: Secondary | ICD-10-CM | POA: Diagnosis not present

## 2019-08-27 DIAGNOSIS — G8929 Other chronic pain: Secondary | ICD-10-CM | POA: Diagnosis not present

## 2019-08-27 DIAGNOSIS — R490 Dysphonia: Secondary | ICD-10-CM | POA: Diagnosis not present

## 2019-08-27 DIAGNOSIS — I10 Essential (primary) hypertension: Secondary | ICD-10-CM | POA: Diagnosis not present

## 2019-08-31 IMAGING — CR CHEST - 2 VIEW
2 series · 2 of 2 positions shown · non-contrast
Comparison: 04/05/2018

CLINICAL DATA: Chest pain, shortness of breath, general malaise

EXAM:
CHEST - 2 VIEW

[chest pa]
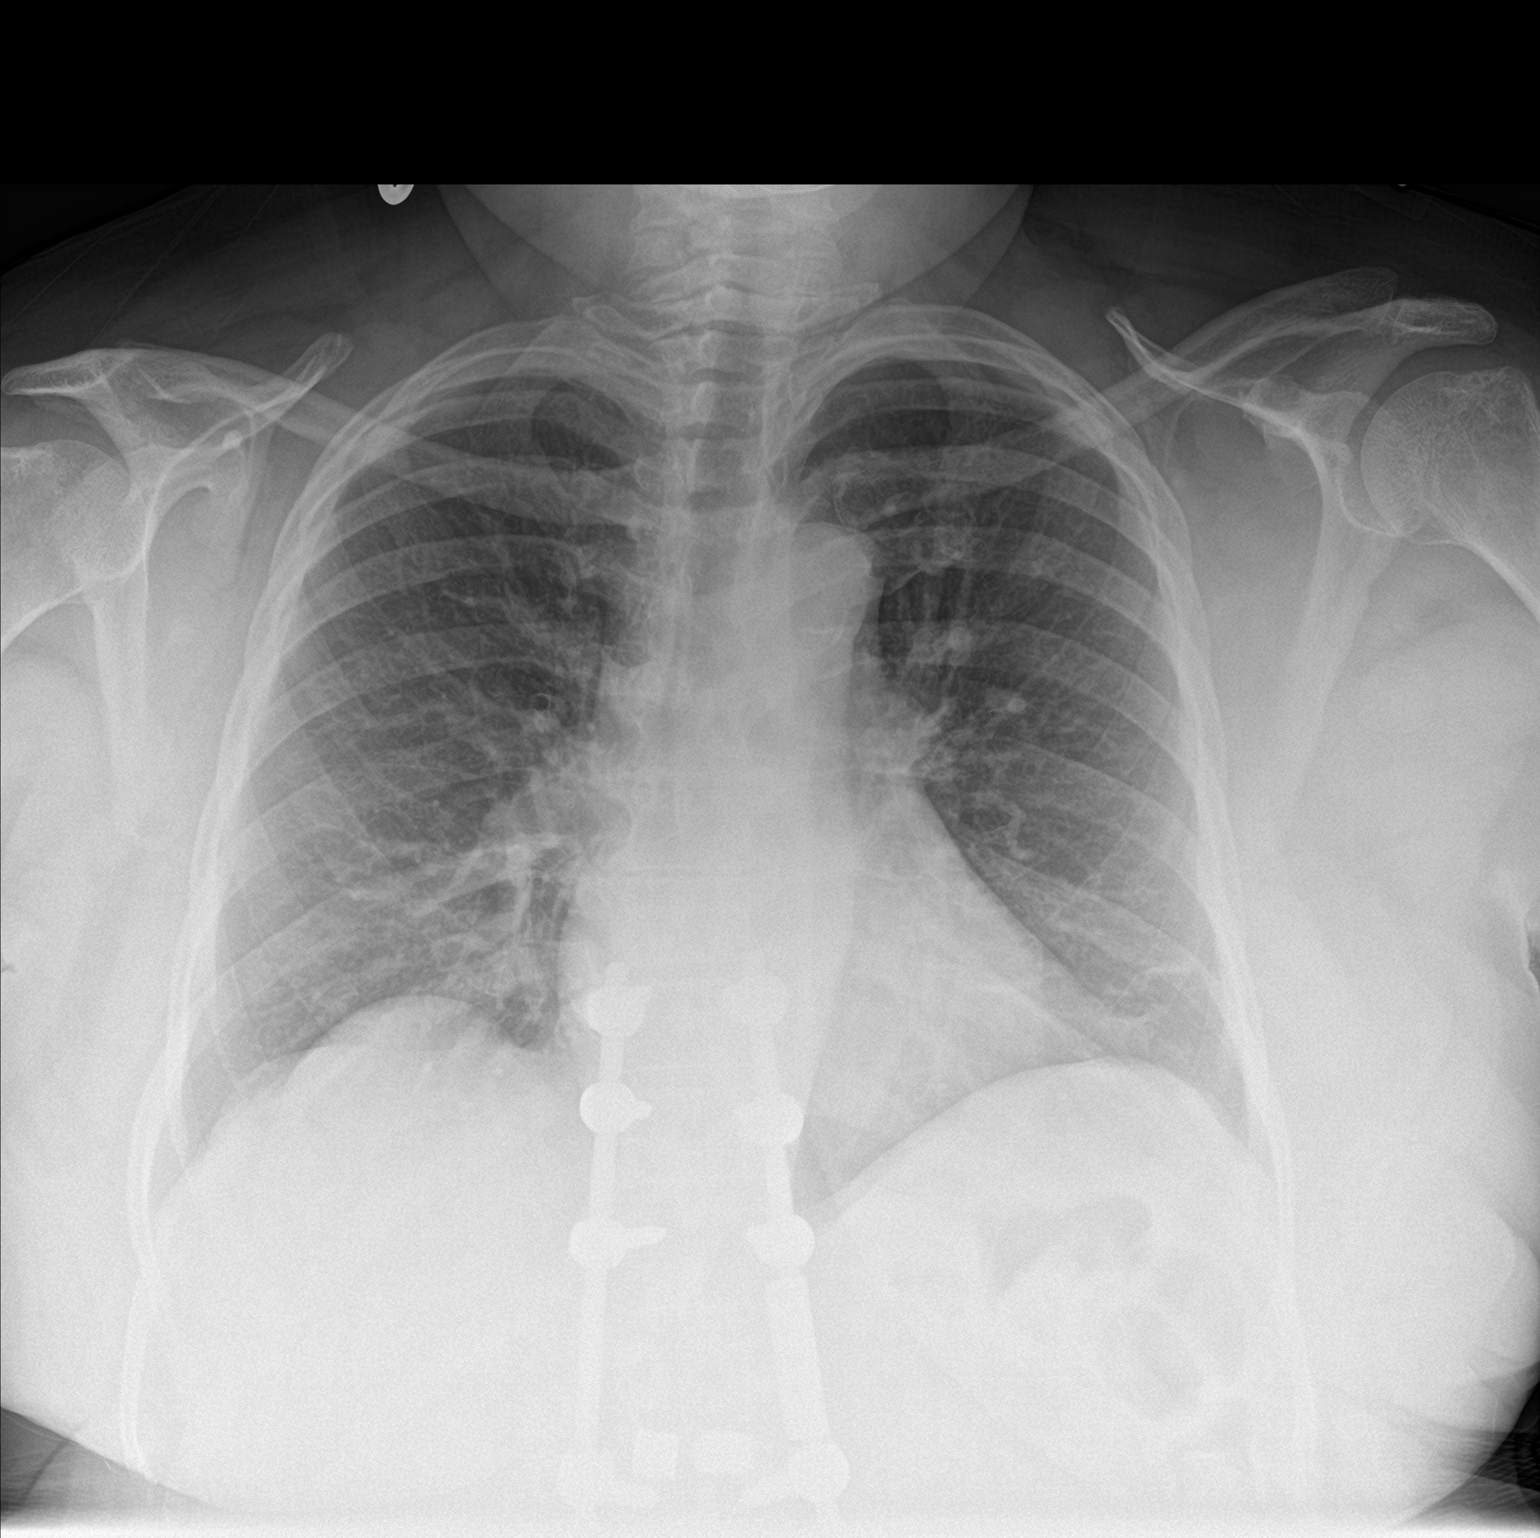

[chest lat]
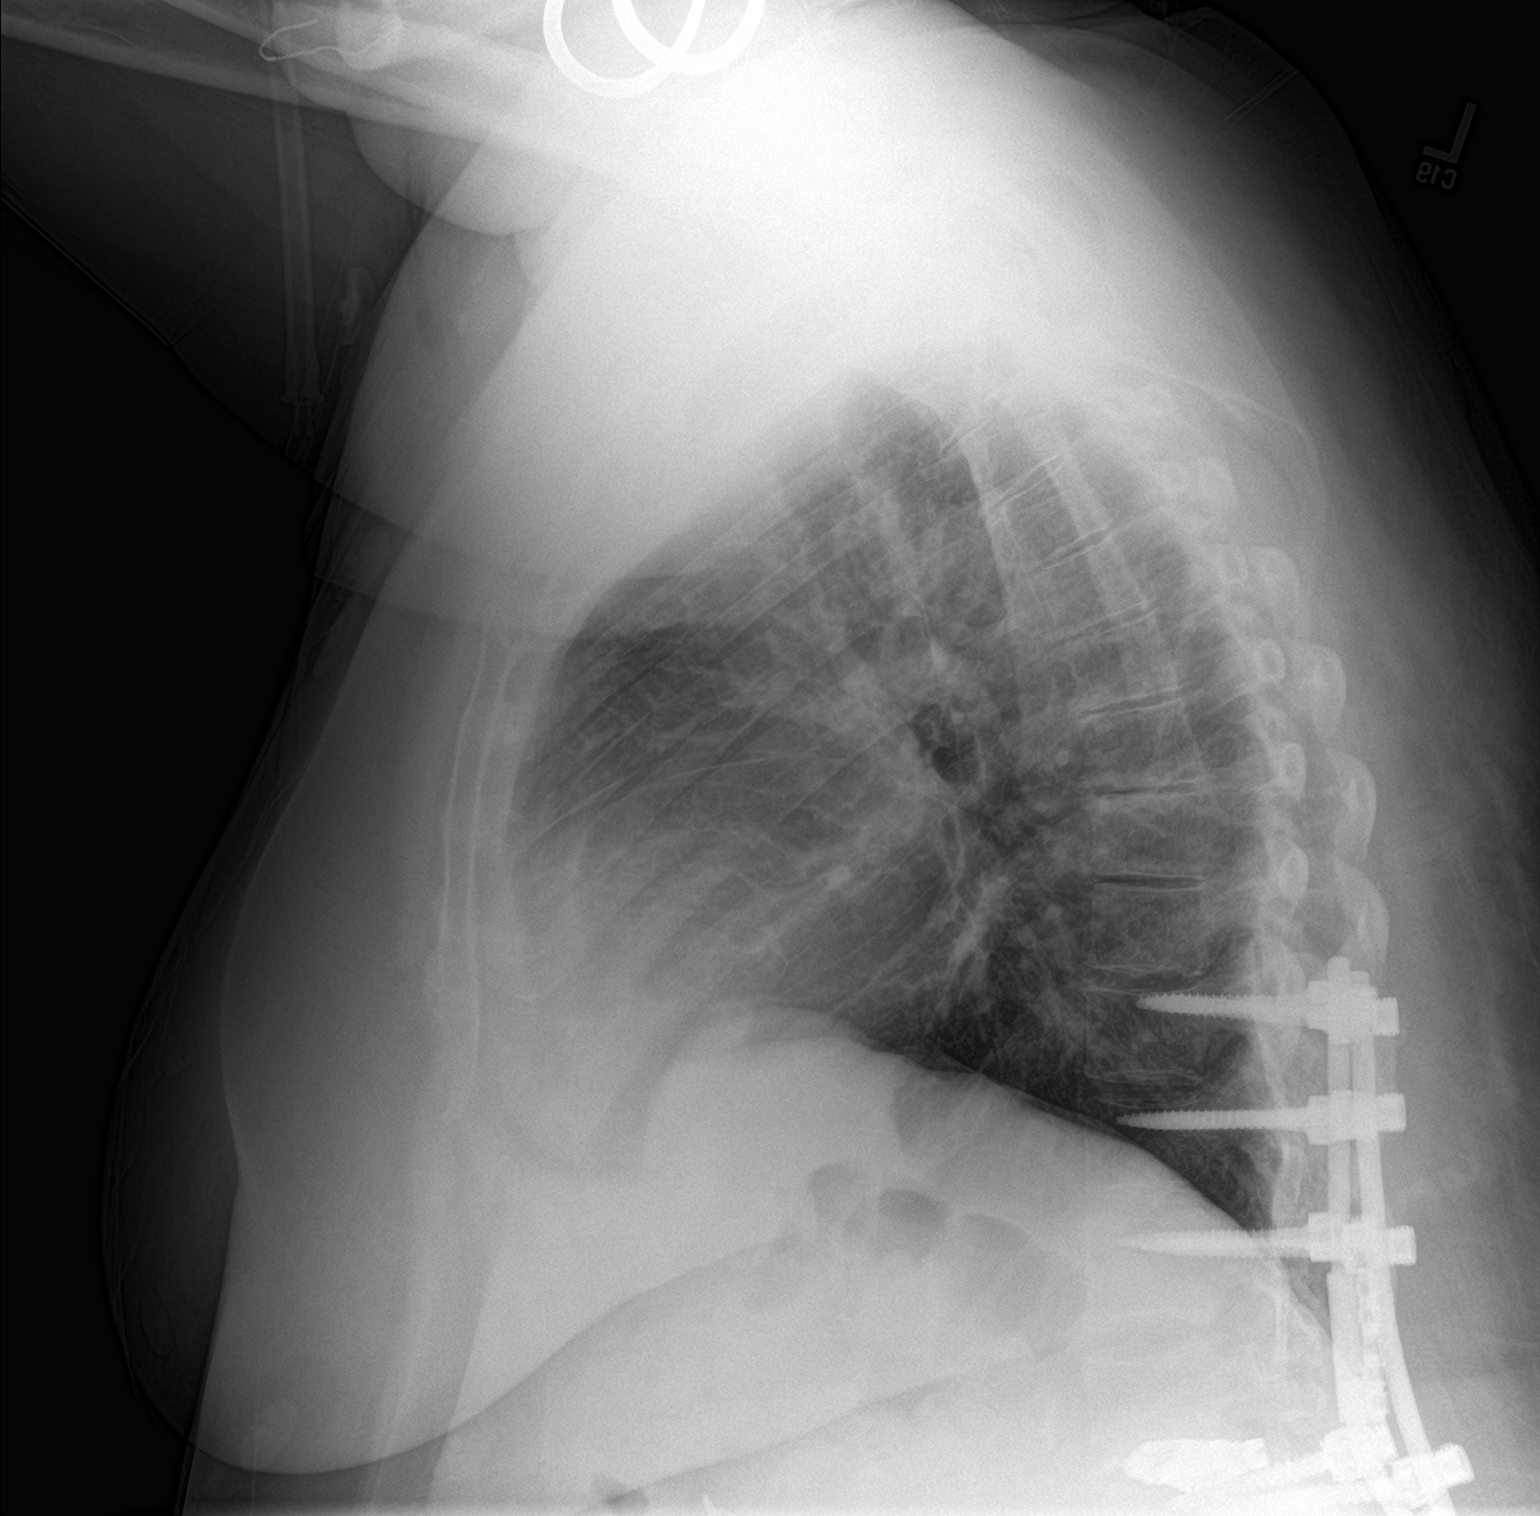

[2 of 2 positions shown; findings below may reference images not displayed]

FINDINGS: The heart size and mediastinal contours are within normal limits.
Both lungs are clear. Partially imaged thoracolumbar fusion.
IMPRESSION: No acute abnormality of the lungs.

## 2019-09-01 IMAGING — CT CT ANGIOGRAPHY CHEST
2 of 7 series · 18 of 46 positions shown · IV contrast (APPLIED)
Comparison: Chest x-ray dated 04/01/2019 and CT of the chest dated
04/05/2018

CLINICAL DATA: Chest pain and shortness of breath.

EXAM:
CT ANGIOGRAPHY CHEST WITH CONTRAST
TECHNIQUE: Multidetector CT imaging of the chest was performed using the
standard protocol during bolus administration of intravenous
contrast. Multiplanar CT image reconstructions and MIPs were
obtained to evaluate the vascular anatomy.
CONTRAST:  80mL OMNIPAQUE IOHEXOL 350 MG/ML SOLN

[Series 7: thins · axial · 0.88mm/px · z∈[+1191,+1437]mm · 15 of 398 slices shown]
[im 23/398  lung]
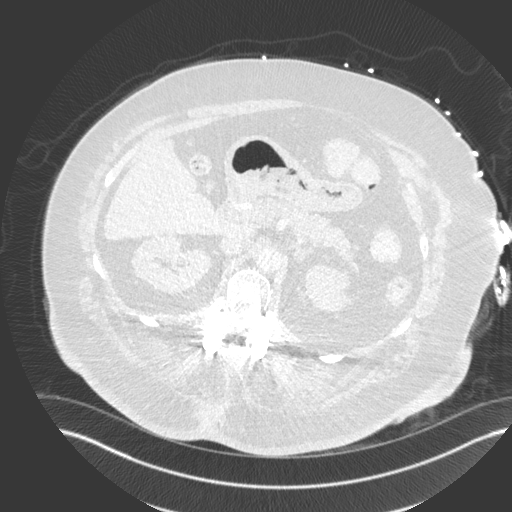
[im 45/398  soft-tissue]
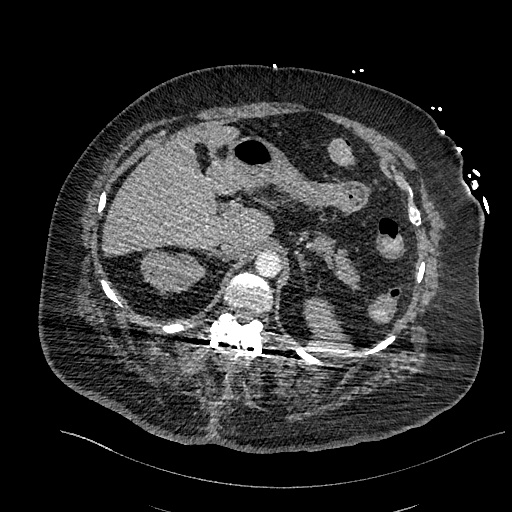
[im 67/398  lung]
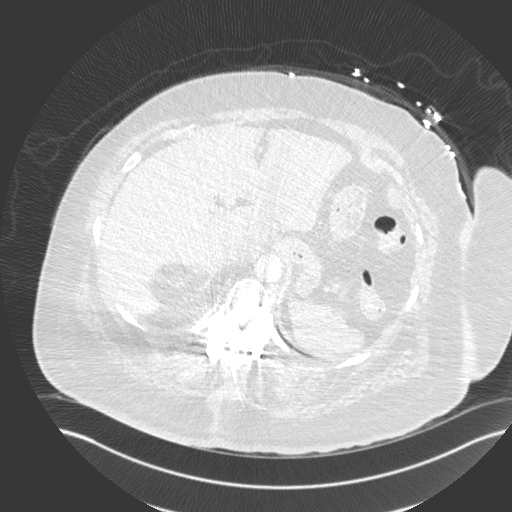
[im 89/398  soft-tissue]
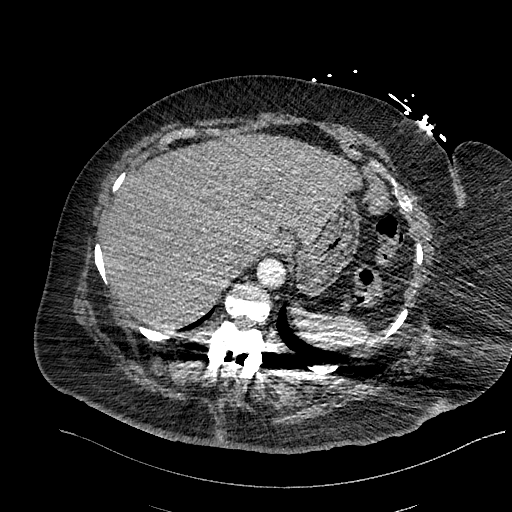
[im 133/398  lung]
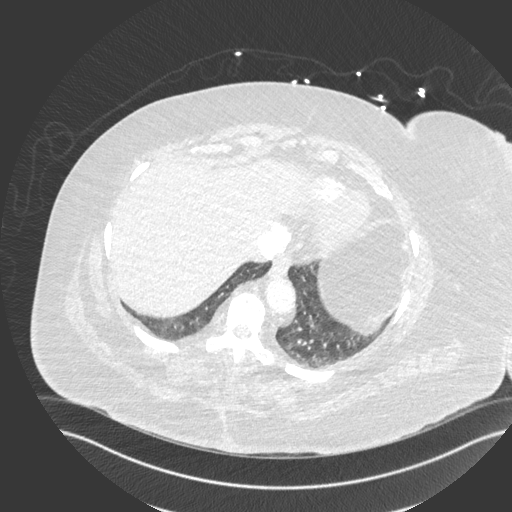
[im 155/398  soft-tissue]
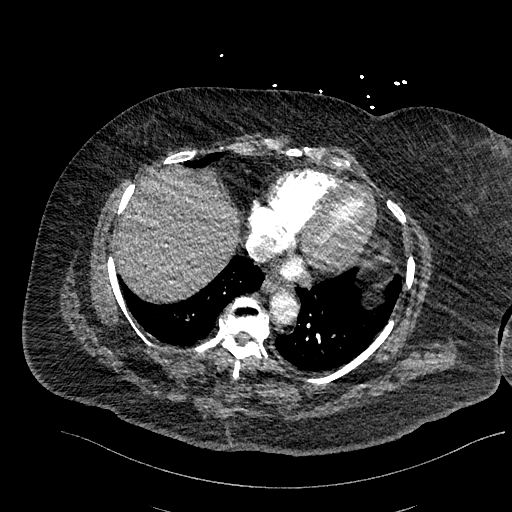
[im 177/398  lung]
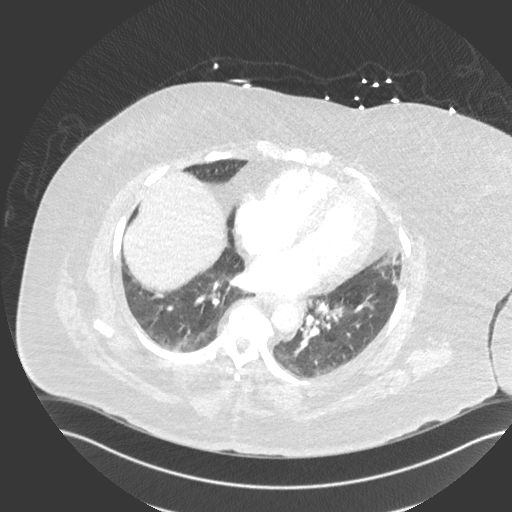
[im 199/398  soft-tissue]
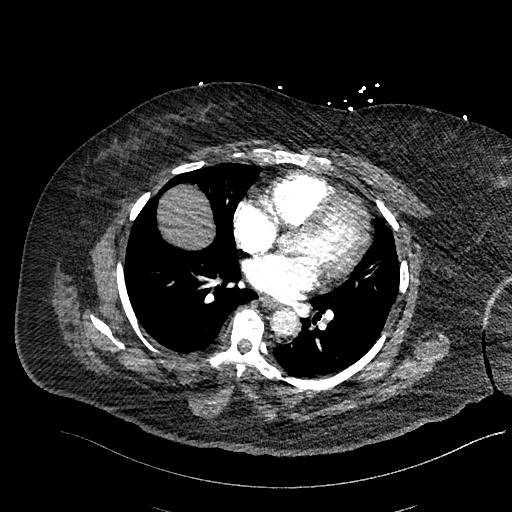
[im 221/398  lung]
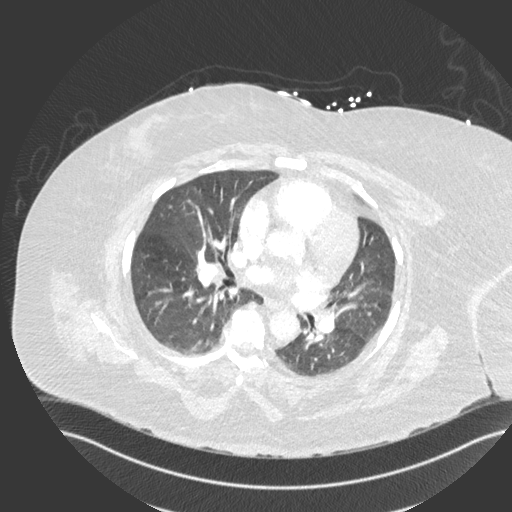
[im 243/398  soft-tissue]
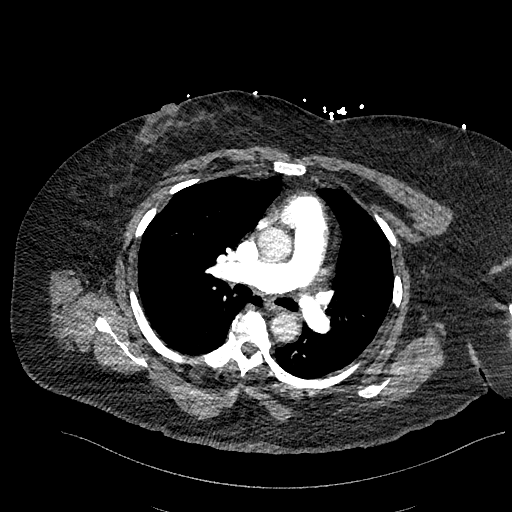
[im 265/398  lung]
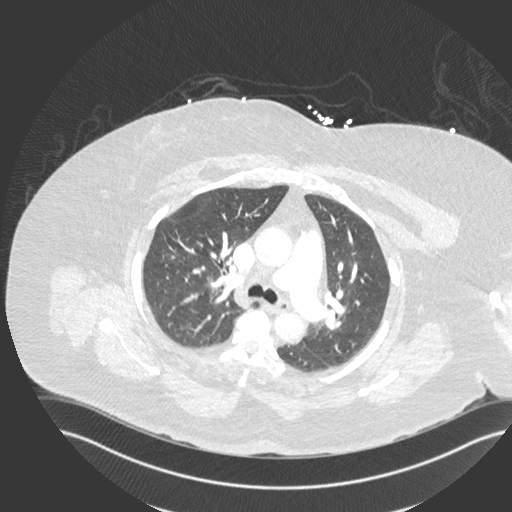
[im 309/398  soft-tissue]
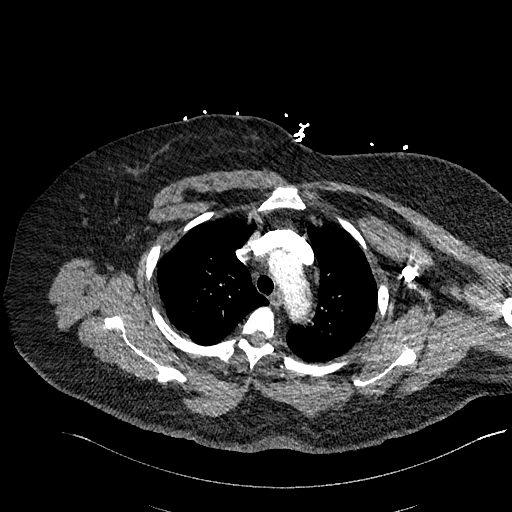
[im 331/398  lung]
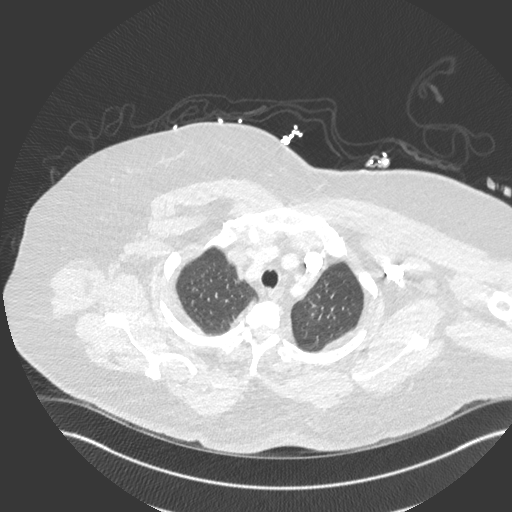
[im 353/398  soft-tissue]
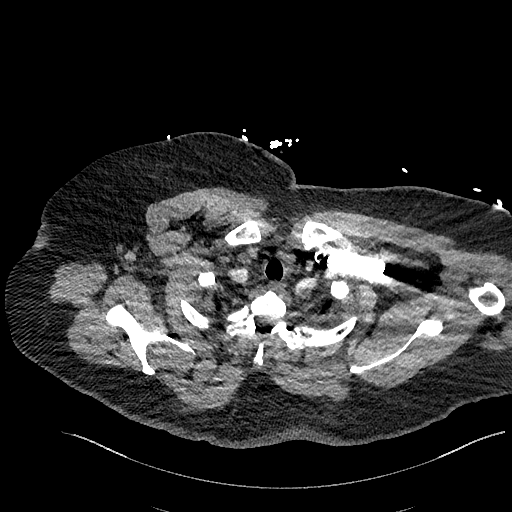
[im 375/398  lung]
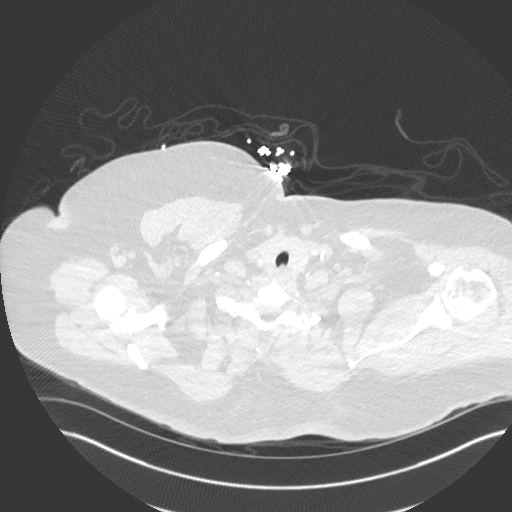

[Series 8: cor · coronal · 0.61mm/px · 3 of 173 slices shown]
[im 44/173  soft-tissue]
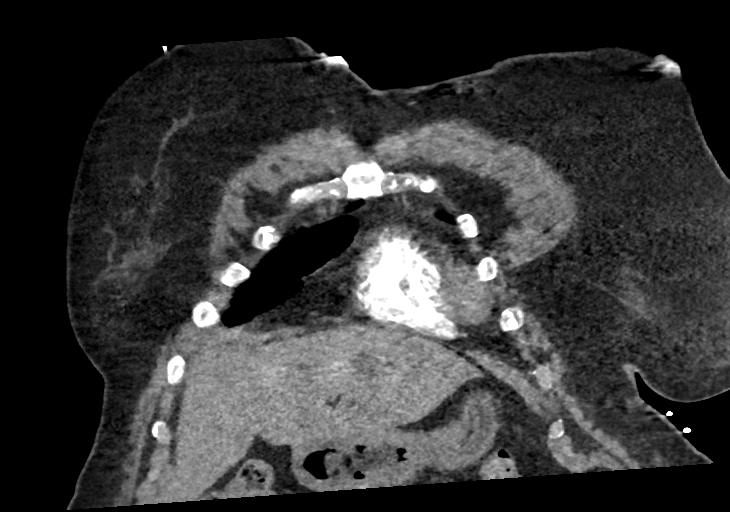
[im 87/173  soft-tissue]
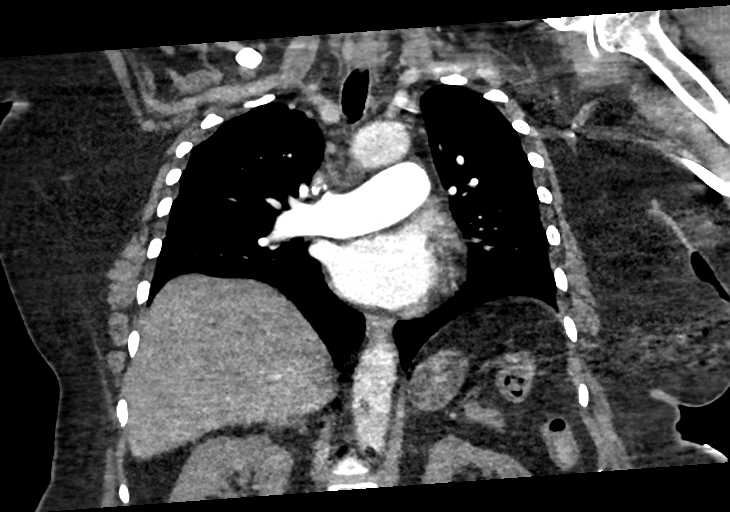
[im 130/173  soft-tissue]
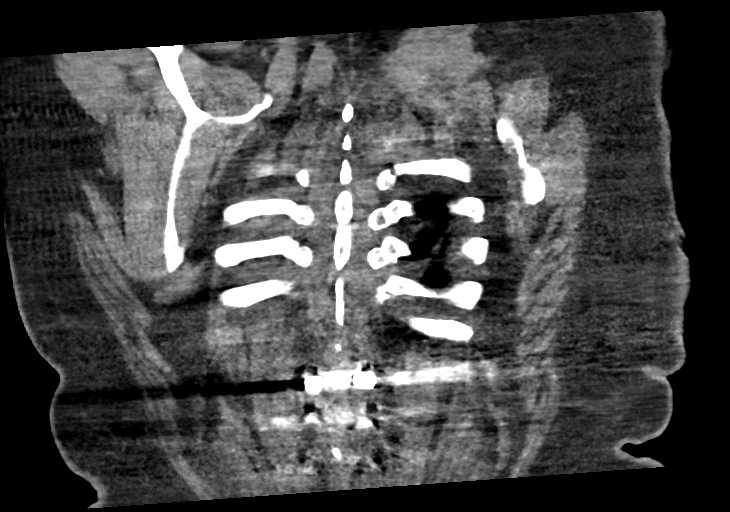

[18 of 46 positions shown; findings below may reference images not displayed]

FINDINGS: Cardiovascular: Aortic atherosclerosis. Heart size is normal.
Pericardial effusion. No pulmonary emboli.

Mediastinum/Nodes: No enlarged mediastinal, hilar, or axillary lymph
nodes. Thyroid gland, trachea, and esophagus demonstrate no
significant findings.

Lungs/Pleura: There is minimal scarring in the lingula, stable. The
lungs are otherwise clear. No effusions.

Upper Abdomen: No acute abnormality.

Musculoskeletal: No chest wall abnormality. No acute or significant
osseous findings.

Review of the MIP images confirms the above findings.
IMPRESSION: No pulmonary emboli or other acute abnormalities.

Aortic Atherosclerosis ([email or web address]).

## 2019-09-03 IMAGING — MR MRI OF THE RIGHT ANKLE WITHOUT CONTRAST
5 series · 40 of 40 positions shown · non-contrast
Comparison: Radiographs dated 02/16/2019

CLINICAL DATA: Right ankle pain and swelling.

EXAM:
MRI OF THE RIGHT ANKLE WITHOUT CONTRAST
TECHNIQUE: Multiplanar, multisequence MR imaging of the ankle was performed. No
intravenous contrast was administered.

[Series 4: T2 fat-sat · axial · 3.0mm · 0.50mm/px · z∈[-108,+30]mm · 9 of 36 slices shown (1 of 2)]
[im 1/36]
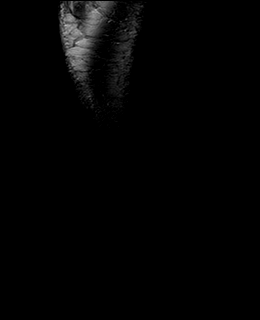
[im 5/36]
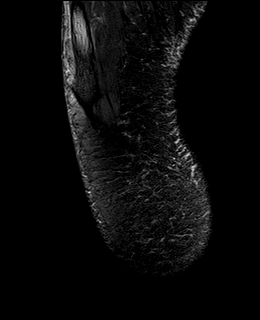
[im 9/36]
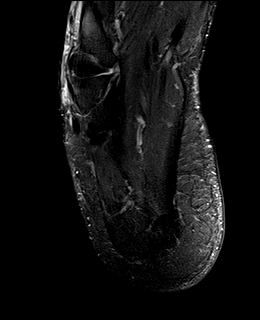
[im 14/36]
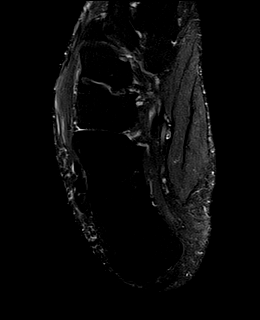
[im 18/36]
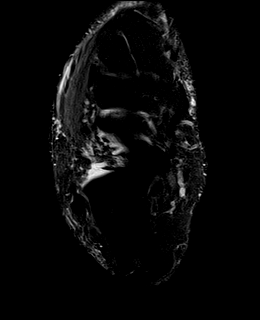
[im 22/36]
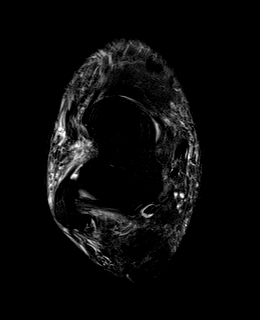
[im 27/36]
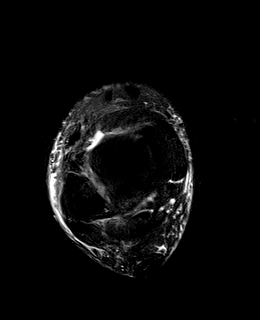
[im 31/36]
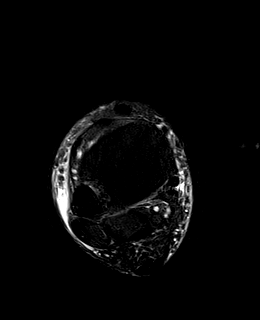
[im 36/36]
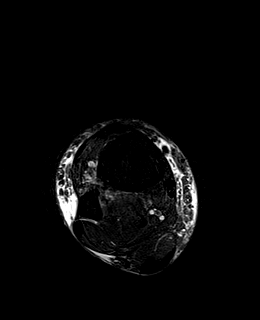

[Series 5: PD fat-sat · axial · 3.5mm · 0.47mm/px · z∈[-116,+35]mm · 9 of 36 slices shown]
[im 1/36]
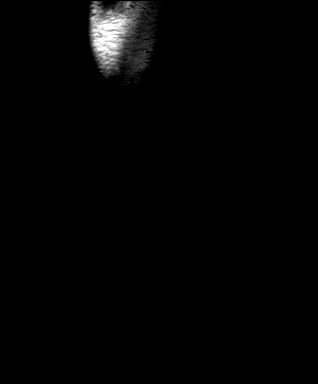
[im 5/36]
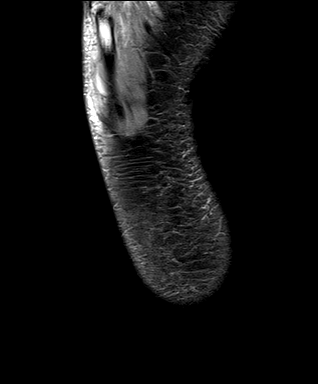
[im 9/36]
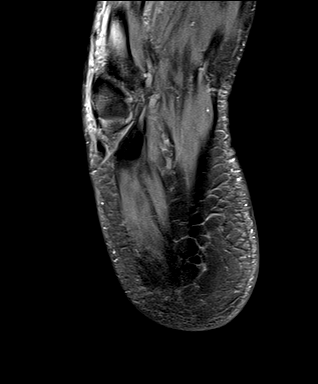
[im 14/36]
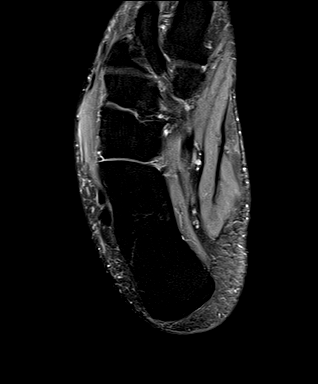
[im 18/36]
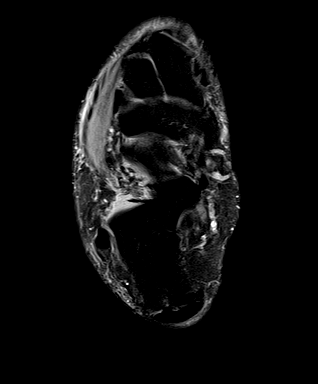
[im 22/36]
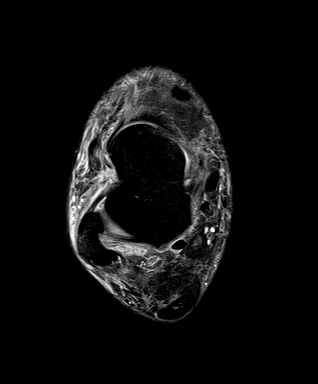
[im 27/36]
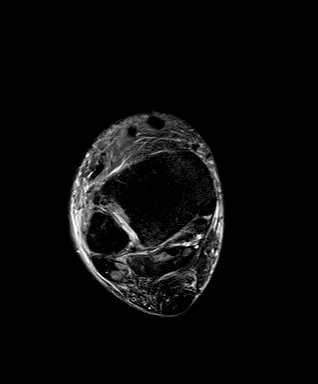
[im 31/36]
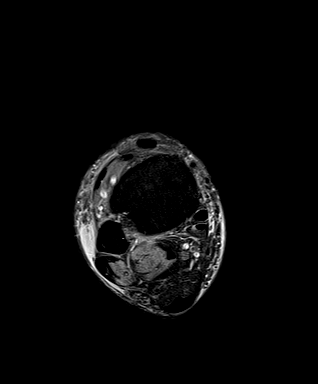
[im 36/36]
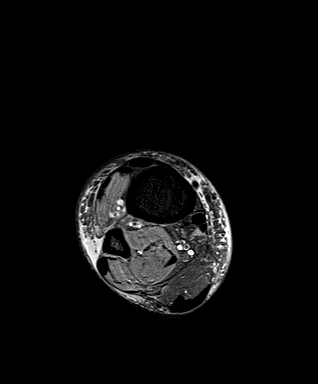

[Series 6: T2 fat-sat · coronal · 3.0mm · 0.62mm/px · 10 of 39 slices shown (2 of 2)]
[im 1/39]
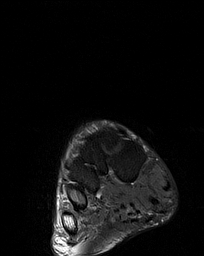
[im 5/39]
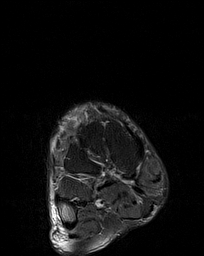
[im 9/39]
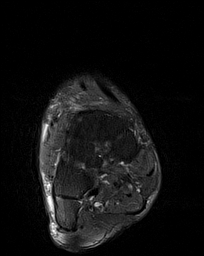
[im 13/39]
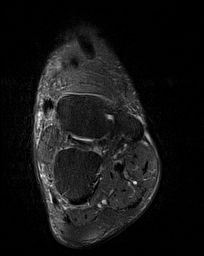
[im 17/39]
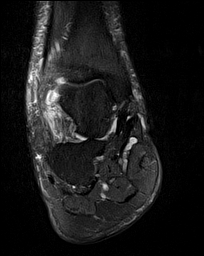
[im 22/39]
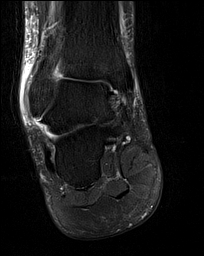
[im 26/39]
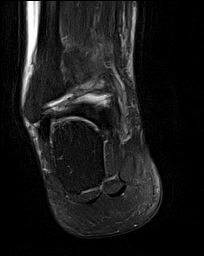
[im 30/39]
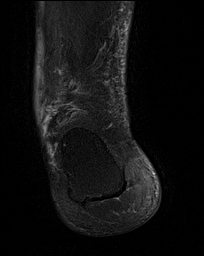
[im 34/39]
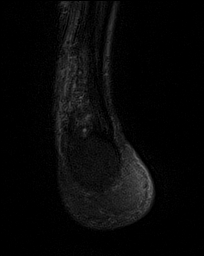
[im 39/39]
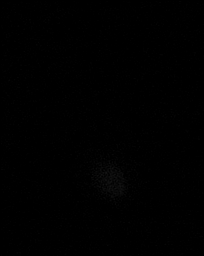

[Series 7: T1 · sagittal · 4.0mm · 0.56mm/px · 6 of 22 slices shown]
[im 1/22]
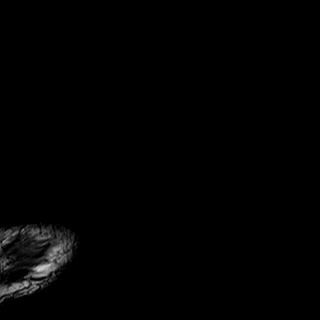
[im 5/22]
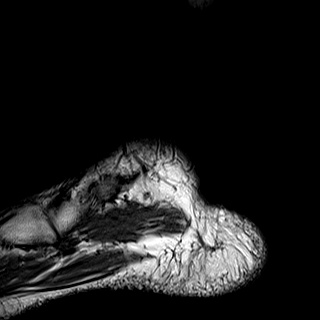
[im 9/22]
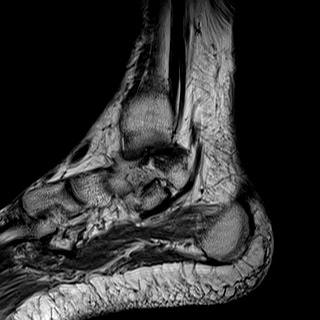
[im 13/22]
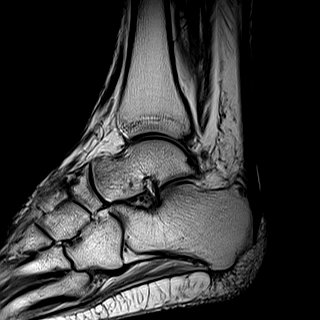
[im 17/22]
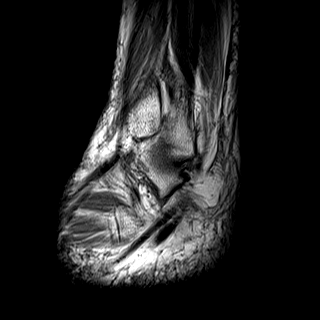
[im 22/22]
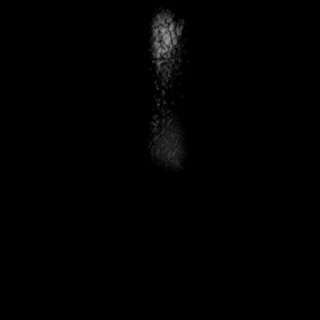

[Series 8: STIR · sagittal · 4.0mm · 0.70mm/px · 6 of 22 slices shown]
[im 1/22]
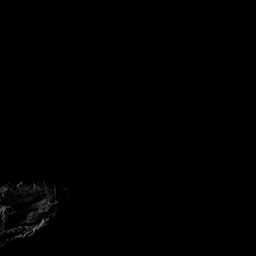
[im 5/22]
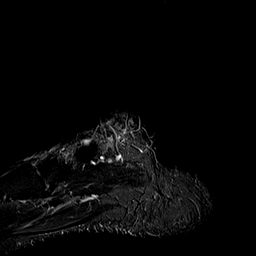
[im 9/22]
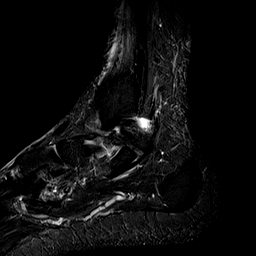
[im 13/22]
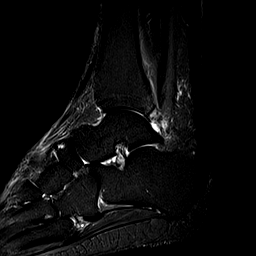
[im 17/22]
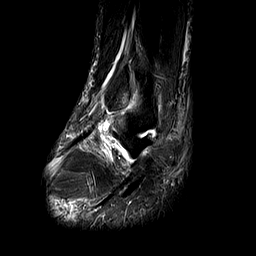
[im 22/22]
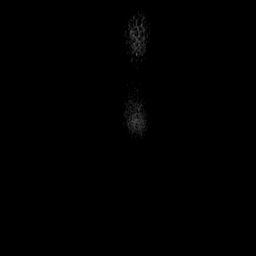

[40 of 40 positions shown; findings below may reference images not displayed]

FINDINGS: TENDONS

Peroneal: Normal.

Posteromedial: Normal.

Anterior: Normal.

Achilles: Slight hypertrophy and intrinsic degeneration of the
Achilles tendon. No discrete tears.

Plantar Fascia: Normal.

LIGAMENTS

Lateral: Intact.

Medial: Intact.

CARTILAGE

Ankle Joint: Minimal joint effusion.  No chondral defect.

Subtalar Joints/Sinus Tarsi: Minimal joint effusion. No chondral
defect.

Bones: No significant abnormality. Small degenerative cyst in the
dorsal lateral aspect of navicular.

Other: Nonspecific mild subcutaneous edema at the medial and lateral
aspects of the ankle.
IMPRESSION: 1. Chronic Achilles tendinopathy.
2. No other significant abnormalities.

## 2019-09-08 ENCOUNTER — Ambulatory Visit (INDEPENDENT_AMBULATORY_CARE_PROVIDER_SITE_OTHER): Payer: Medicare Other

## 2019-09-08 ENCOUNTER — Ambulatory Visit (INDEPENDENT_AMBULATORY_CARE_PROVIDER_SITE_OTHER): Payer: Medicare Other | Admitting: Sports Medicine

## 2019-09-08 ENCOUNTER — Telehealth: Payer: Self-pay | Admitting: *Deleted

## 2019-09-08 ENCOUNTER — Encounter: Payer: Self-pay | Admitting: Sports Medicine

## 2019-09-08 ENCOUNTER — Other Ambulatory Visit: Payer: Self-pay

## 2019-09-08 DIAGNOSIS — M7989 Other specified soft tissue disorders: Secondary | ICD-10-CM | POA: Diagnosis not present

## 2019-09-08 DIAGNOSIS — M79671 Pain in right foot: Secondary | ICD-10-CM | POA: Diagnosis not present

## 2019-09-08 DIAGNOSIS — S9030XA Contusion of unspecified foot, initial encounter: Secondary | ICD-10-CM

## 2019-09-08 DIAGNOSIS — S93601A Unspecified sprain of right foot, initial encounter: Secondary | ICD-10-CM

## 2019-09-08 DIAGNOSIS — S9031XA Contusion of right foot, initial encounter: Secondary | ICD-10-CM | POA: Diagnosis not present

## 2019-09-08 MED ORDER — PREDNISONE 10 MG (21) PO TBPK: ORAL_TABLET | ORAL | 0 refills | Status: DC

## 2019-09-08 NOTE — Telephone Encounter (Signed)
Pt called states she was getting out of bed and her right leg gave out and she sat down hard on it and now she is in extreme pain and can not walk.

## 2019-09-08 NOTE — Progress Notes (Signed)
Subjective:  Amy [name] is a 73 y.o. female patient who presents to office for evaluation right foot pain reports that she was trying to get out of bed on Sunday and fell and she twisted back and fell on her foot and reports that her whole entire foot and even her leg hurts into her thigh reports that her entire foot hurts even at her bunion joint as well since the fall reports that she is having difficulty walking and has had history of falls and even after having therapy does not feel like it helped because they did not focus on helping her feet or lower extremities they focus on other areas.  Patient reports that her nephew died last 01-10-23 night and has been having a difficult time adjusting and sleeping at night however for her foot has been continuing with Tylenol and Nembutal reports that on Sunday initially she could not put any weight on the foot but once she started using the cam boot reports that the pain seemed to decrease and could walk better with the cam boot of which she tried for the first time today.  Patient denies any increase in redness warmth swelling bruising or any other constitutional symptoms at this time.  Patient Active Problem List   Diagnosis Date Noted  . Uncontrolled daytime somnolence 04/12/2019  . Snoring 01/22/2019  . Morbid obesity with body mass index of 45.0-49.9 in adult [facility]) 01/22/2019  . OSA (obstructive sleep apnea) 01/22/2019  . Wound dehiscence 07/22/2016  . Dyspnea 07/21/2016  . Acute encephalopathy 07/21/2016  . Anemia 07/21/2016  . Pancreatitis 07/02/2016  . SBO (small bowel obstruction) ([place]) 07/02/2016  . Long term current use of opiate analgesic 06/26/2016  . Long term prescription opiate use 06/26/2016  . Opiate use 06/26/2016  . Encounter for therapeutic drug level monitoring 06/26/2016  . Encounter for pain management planning 06/26/2016  . Chronic pain 06/26/2016  . Disturbance of skin sensation 06/26/2016  . Chronic low  back pain (Location of Primary Source of Pain) (Bilateral) (R>L) 06/26/2016  . Chronic hip pain (Location of Secondary source of pain) (Bilateral) (R>L) 06/26/2016  . 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 Chronic shoulder pain (Location of Tertiary source of pain) (Bilateral) (R>L) 06/26/2016  . 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 Chronic knee pain (Bilateral) (R>L) 06/26/2016  . 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 Chronic neck pain (Right) 06/26/2016  . Lumbar spondylosis 06/26/2016  . Lumbar facet syndrome 06/26/2016  . Lumbar facet hypertrophy 06/26/2016  . 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 Neurogenic pain 06/26/2016  . Occipital headaches 06/26/2016  . 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 History of lumbar fusion 06/26/2016  . 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  . Neuropathic pain of both legs 10/13/2015  . Constipation 10/13/2015  . Complete rotator cuff tear of left shoulder 05/05/2014  . Depression 08/06/2013  . Hypokalemia 08/06/2013  . GERD (gastroesophageal reflux disease) 08/06/2013  . Spinal stenosis of lumbar region 08/06/2013  . Bladder spasm 08/06/2013  . Morbid obesity ([place]) 02/03/2013  . Lipoma of buttock s/p excision 02/20/2013 02/03/2013  . DYSPNEA 01/23/2008  . Obstructive sleep apnea 09/12/2007  . HTN (hypertension) 09/12/2007  . Allergic rhinitis 09/12/2007  . SLEEPINESS 09/12/2007  . ANGINA, HX OF 09/12/2007    Current Outpatient Medications on File Prior to Visit  Medication Sig Dispense Refill  . acetaminophen (TYLENOL) 500 MG tablet Take 1,000 mg by  mouth every 6 (six) hours as needed for moderate pain.     Marland Kitchen azelaic acid (AZELEX) 20 % cream Apply 1 application topically 2 (two) times daily. After skin is thoroughly washed and patted dry, gently but thoroughly massage a thin film of azelaic acid cream into the affected area twice daily, in  the morning and evening.    . B Complex Vitamins (B COMPLEX PO) Take by mouth.    . calcium-vitamin D (OSCAL WITH D) 500-200 MG-UNIT tablet Take 1 tablet by mouth daily with breakfast.    . ciprofloxacin (CIPRO) 250 MG tablet TK 1 T PO Q 12 H FOR 7 DAYS    . cloNIDine (CATAPRES) 0.1 MG tablet Take 1 tablet (0.1 mg total) by mouth 3 (three) times daily. 60 tablet 11  . diclofenac sodium (VOLTAREN) 1 % GEL Apply 4 g topically 4 (four) times daily. 100 g 0  . diphenhydrAMINE (BENADRYL) 25 MG tablet Take 25 mg by mouth 2 (two) times daily as needed for allergies.    Marland Kitchen estradiol (ESTRACE) 0.1 MG/GM vaginal cream Place 1 Applicatorful vaginally 2 (two) times a week. (Patient taking differently: Place 1 Applicatorful vaginally 2 (two) times a week. Tuesday and Thursday) 42.5 g 12  . fluticasone (FLONASE) 50 MCG/ACT nasal spray Place 1 spray into both nostrils daily.  2  . gabapentin (NEURONTIN) 300 MG capsule Take 300 mg by mouth 2 (two) times daily.    . hydrALAZINE (APRESOLINE) 50 MG tablet Take 50 mg by mouth 3 (three) times daily.    . hydrochlorothiazide (HYDRODIURIL) 12.5 MG tablet Take 12.5 mg by mouth daily.    Marland Kitchen losartan (COZAAR) 100 MG tablet TK 1 T PO ONCE D    . meloxicam (MOBIC) 15 MG tablet Take 15 mg by mouth daily.    . Menthol-Methyl Salicylate (MUSCLE RUB EX) Apply 1 application topically 2 (two) times daily as needed (pain in neck and back).     . methocarbamol (ROBAXIN) 500 MG tablet Take 500 mg by mouth 2 (two) times daily as needed for muscle spasms.    . metoprolol (LOPRESSOR) 50 MG tablet Take 1 tablet (50 mg total) by mouth 2 (two) times daily.    Marland Kitchen NIFEdipine (PROCARDIA) 10 MG capsule Take 10 mg by mouth 3 (three) times daily.    . ondansetron (ZOFRAN-ODT) 4 MG disintegrating tablet DIS 1 T ON THE TONGUE TID PRN    . OVER THE COUNTER MEDICATION Take 2 tablets by mouth daily. Swiss Kriss OTC herbal laxative    . pantoprazole (PROTONIX) 40 MG tablet Take 80 mg by mouth daily.   0   . PARoxetine (PAXIL) 30 MG tablet Take 30 mg by mouth daily.    . potassium chloride SA (K-DUR,KLOR-CON) 20 MEQ tablet Take 20 mEq by mouth daily.    . tapentadol (NUCYNTA) 50 MG tablet Take 50 mg by mouth every 6 (six) hours as needed for moderate pain.    Marland Kitchen triamcinolone ointment (KENALOG) 0.5 % Apply 1 application topically 2 (two) times daily. To rash to feet 30 g 0  . vitamin B-12 (CYANOCOBALAMIN) 1000 MCG tablet Take 1,000 mcg by mouth daily.    . vitamin E (VITAMIN E) 400 UNIT capsule Take 400 Units by mouth daily.     No current facility-administered medications on file prior to visit.    Allergies  Allergen Reactions  . Latex Other (See Comments)    Sores, blisters - reaction to gloves  . Crestor [Rosuvastatin Calcium]  Myalgias  . Penicillins Other (See Comments)    UNSPECIFIED REACTION  "Has taken amoxicillin and ampicillin with no reaction" Has patient had a PCN reaction causing immediate rash, facial/tongue/throat swelling, SOB or lightheadedness with hypotension: Unknown Has patient had a PCN reaction causing severe rash involving mucus membranes or skin necrosis: Unknown Has patient had a PCN reaction that required hospitalization: Unknown Has patient had a PCN reaction occurring within the last 10 years: No   . Tetanus Toxoids Rash  . Tomato Rash    Objective:  General: Alert and oriented x3 in no acute distress  Dermatology: No open lesions bilateral lower extremities, no webspace macerations, no ecchymosis bilateral, all nails x 10 are well manicured.  Vascular: Dorsalis Pedis and Posterior Tibial pedal pulses palpable, Capillary Fill Time 3 seconds,(+) pedal hair growth bilateral, trace edema bilateral lower extremities with R>L, Temperature gradient within normal limits. No acute ischemia.   Neurology: Johney Maine sensation intact via light touch bilateral.   Musculoskeletal: There is tenderness with palpation at right ankle at medial and lateral gutter,  negative stress tib-fib test however there is pain with end range of motion at right ankle.  Very minimal reproducible pain to palpation at the level of the foot there is mild pain over the first MPJ otherwise no other symptoms.  Gait: Antalgic gait  Xrays  Right foot/ankle   Impression:soft tissue swelling. No fracture.  No other acute findings.  Assessment and Plan: Problem List Items Addressed This Visit    None    Visit Diagnoses    Contusion of foot, unspecified laterality, initial encounter    -  Primary   Relevant Orders   DG Foot Complete Right (Completed)   Sprain of right foot, initial encounter       Swelling of right foot       Right foot pain         -Complete examination performed -X-rays reviewed no acute findings -Discussed with patient treatment options for likely sprain injury -Applied soft cast/Unna boot to right lower extremity for patient to keep intact for 5 days after removal may reapply Ace to help with any swelling and must keep with cam boot for at least the next 2 weeks after 2 weeks if feeling better may transition to normal shoe -Prescribed prednisone Dosepak to take for pain and advised patient that she may continue with her Nembutal for Tylenol as tolerated for any additional pain -Encourage patient to use cane or walker for stability in gait and advised patient that after her right foot heals she may benefit from a reconsult to therapy for gait training and strengthening of her lower extremities -Patient to return to office after 3 weeks for follow-up examination.  Landis Martins, DPM

## 2019-09-08 NOTE — Telephone Encounter (Signed)
I called pt and offered an appt today at 4:15pm and pt states she would get a ride.

## 2019-09-15 ENCOUNTER — Other Ambulatory Visit: Payer: Self-pay | Admitting: Neurosurgery

## 2019-09-15 DIAGNOSIS — S060X0A Concussion without loss of consciousness, initial encounter: Secondary | ICD-10-CM | POA: Insufficient documentation

## 2019-09-19 IMAGING — MR MRI CERVICAL SPINE WITHOUT CONTRAST
4 of 5 series · 30 of 48 positions shown · non-contrast
Comparison: 03/31/2018

CLINICAL DATA: Neck pain with bilateral shoulder pain and numbness
in both arms for several years

EXAM:
MRI CERVICAL SPINE WITHOUT CONTRAST
TECHNIQUE: Multiplanar, multisequence MR imaging of the cervical spine was
performed. No intravenous contrast was administered.

[Series 2: T2 · sagittal · 3.0mm · 0.66mm/px · 8 of 15 slices shown (1 of 2)]
[im 1/15]
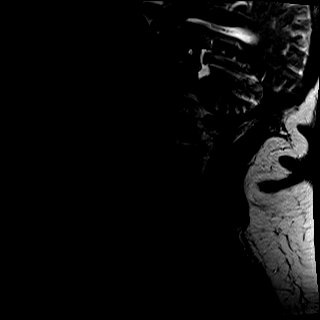
[im 3/15]
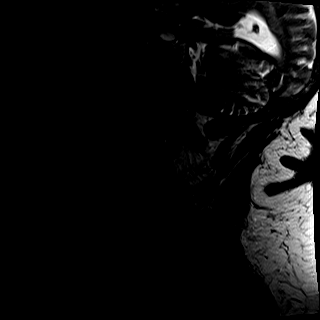
[im 5/15]
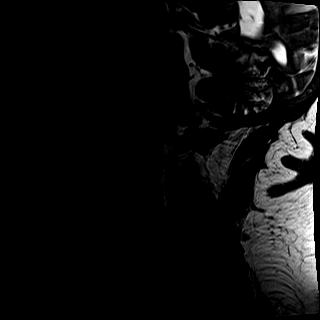
[im 7/15]
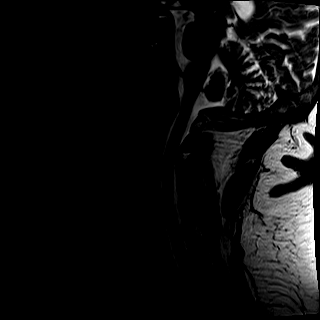
[im 9/15]
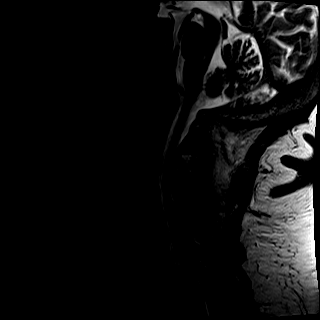
[im 11/15]
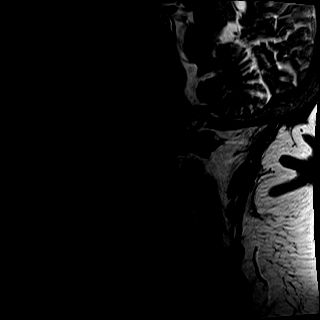
[im 13/15]
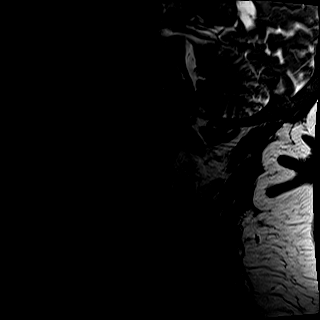
[im 15/15]
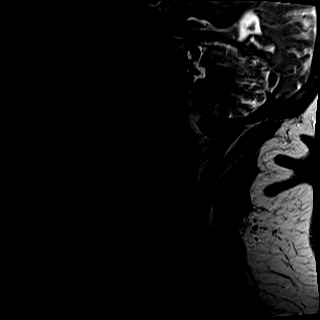

[Series 3: STIR · sagittal · 3.0mm · 0.41mm/px · 6 of 15 slices shown]
[im 1/15]
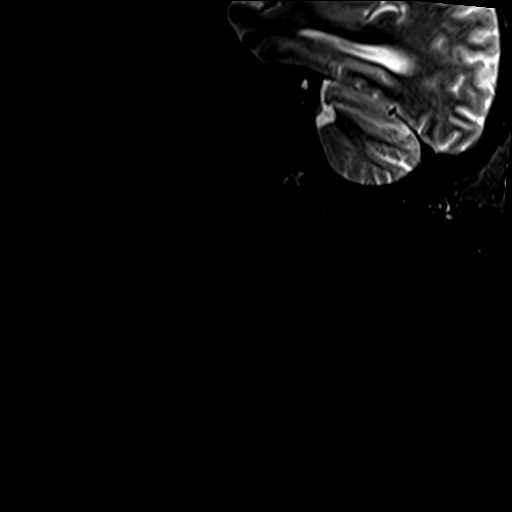
[im 3/15]
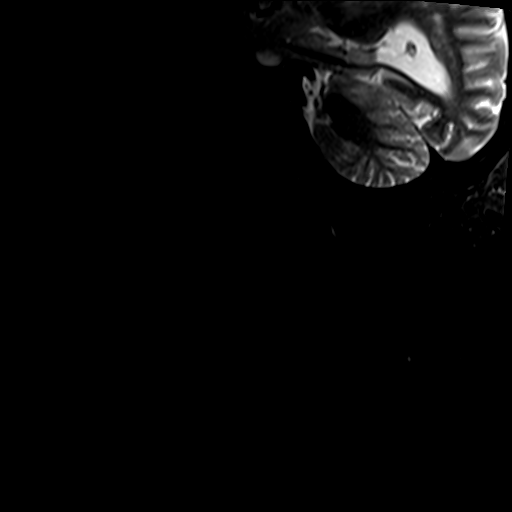
[im 5/15]
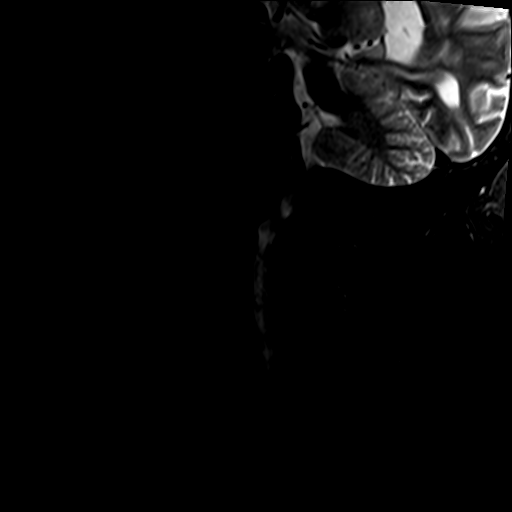
[im 8/15]
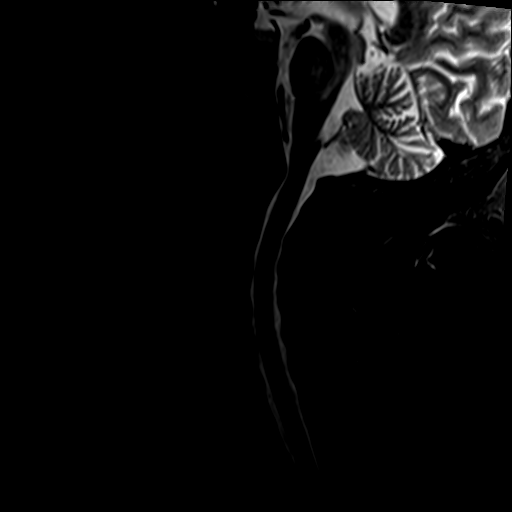
[im 10/15]
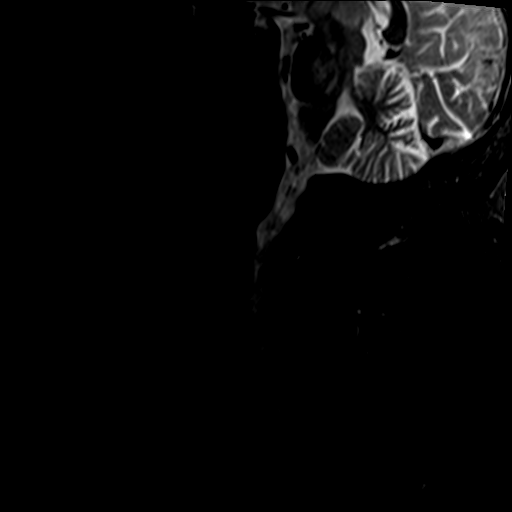
[im 12/15]
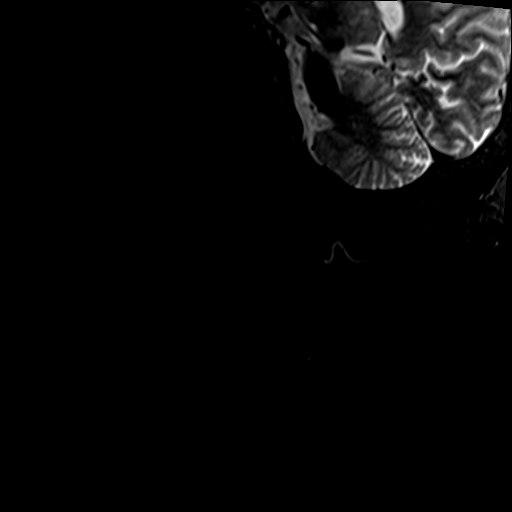

[Series 4: T1 · sagittal · 3.0mm · 0.41mm/px · 7 of 15 slices shown]
[im 1/15]
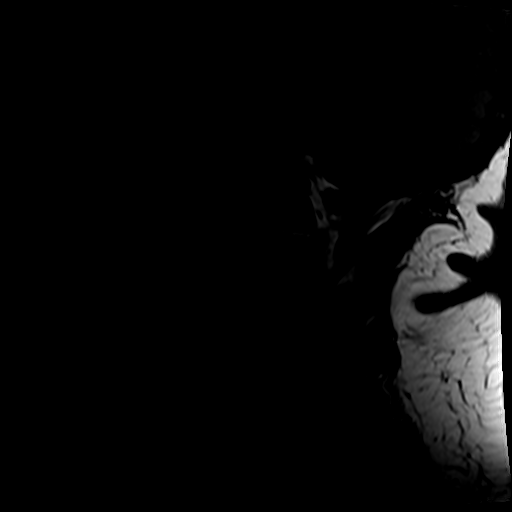
[im 3/15]
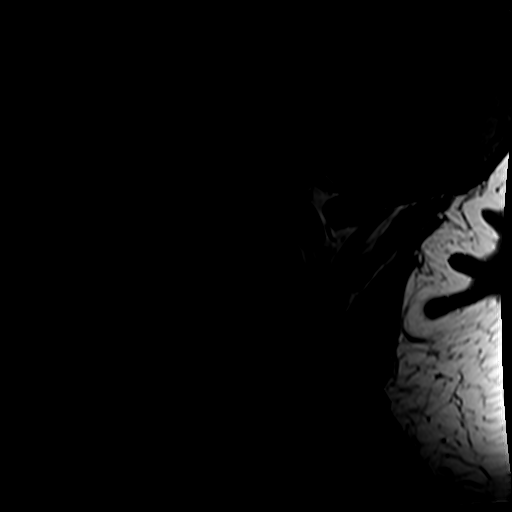
[im 5/15]
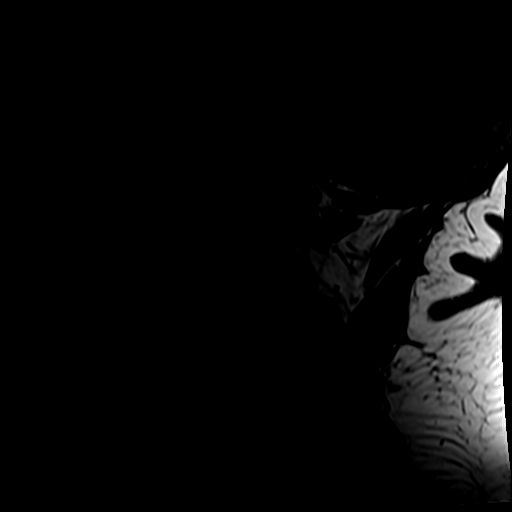
[im 8/15]
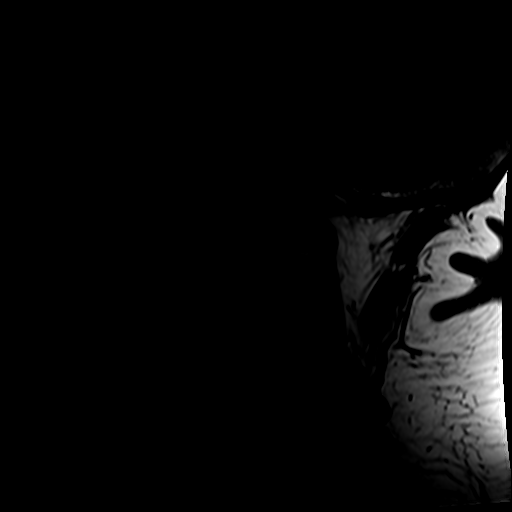
[im 10/15]
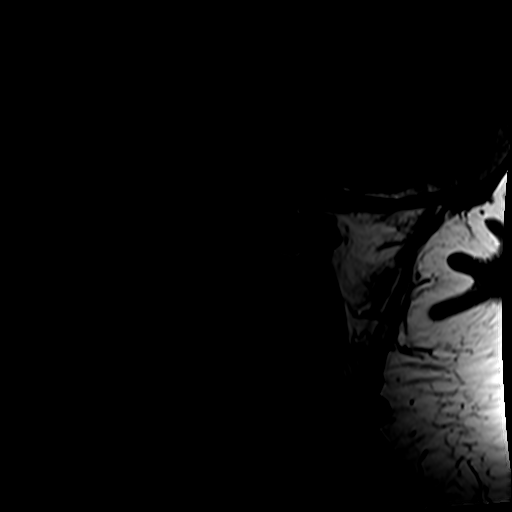
[im 12/15]
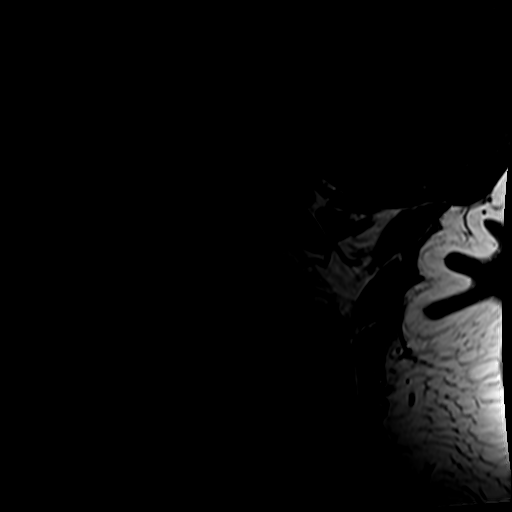
[im 15/15]
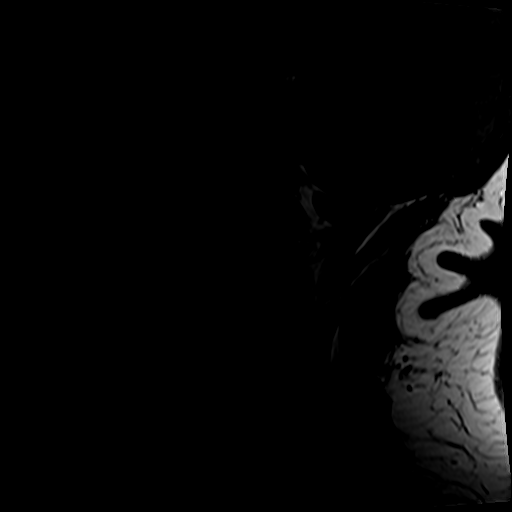

[Series 6: T2 · axial · 3.0mm · 0.70mm/px · z∈[-74,+21]mm · 9 of 28 slices shown (2 of 2)]
[im 1/28]
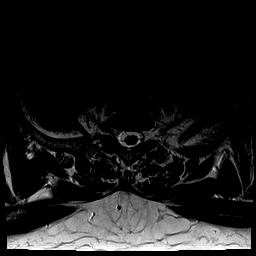
[im 5/28]
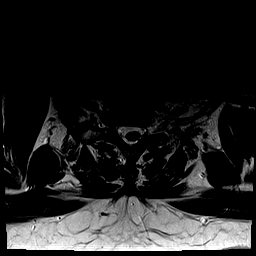
[im 10/28]
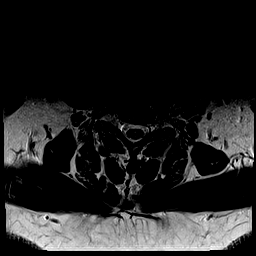
[im 12/28]
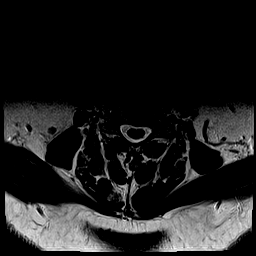
[im 14/28]
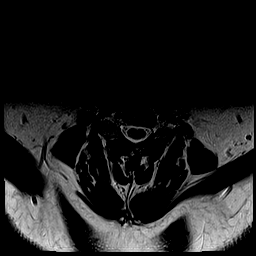
[im 16/28]
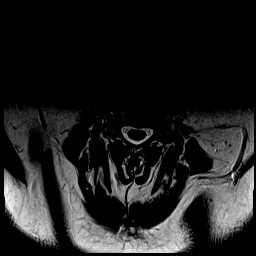
[im 19/28]
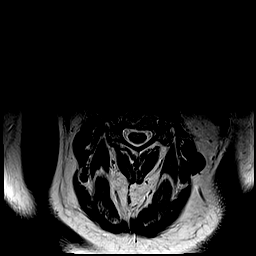
[im 23/28]
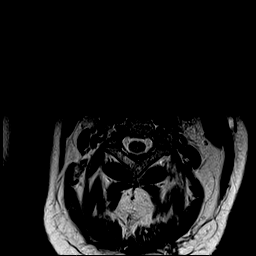
[im 28/28]
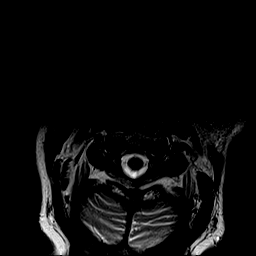

[30 of 48 positions shown; findings below may reference images not displayed]

FINDINGS: Alignment: Normal

Vertebrae: No fracture, evidence of discitis, or bone lesion.

Cord: Normal signal and morphology.

Posterior Fossa, vertebral arteries, paraspinal tissues: T2
hyperintensity in the pons, chronic and best attributed to chronic
small vessel ischemia

Disc levels:

Good disc height and hydration. No significant facet degeneration.
No cord or foraminal impingement.
IMPRESSION: 1. Unremarkable cervical MRI. Degenerative changes are less than
usually seen at patient age and there is no impingement.
2. Chronic small vessel ischemic change in the pons.

## 2019-09-22 DIAGNOSIS — M5440 Lumbago with sciatica, unspecified side: Secondary | ICD-10-CM | POA: Diagnosis not present

## 2019-09-22 DIAGNOSIS — Z79891 Long term (current) use of opiate analgesic: Secondary | ICD-10-CM | POA: Diagnosis not present

## 2019-09-22 DIAGNOSIS — Z6831 Body mass index (BMI) 31.0-31.9, adult: Secondary | ICD-10-CM | POA: Diagnosis not present

## 2019-09-24 ENCOUNTER — Ambulatory Visit
Admission: RE | Admit: 2019-09-24 | Discharge: 2019-09-24 | Disposition: A | Discharge status: Home / Self Care | Payer: Medicare Other | Source: Ambulatory Visit | Attending: Neurosurgery | Admitting: Neurosurgery

## 2019-09-24 DIAGNOSIS — S060X0A Concussion without loss of consciousness, initial encounter: Secondary | ICD-10-CM

## 2019-09-29 ENCOUNTER — Encounter: Payer: Self-pay | Admitting: Sports Medicine

## 2019-09-29 ENCOUNTER — Other Ambulatory Visit: Payer: Self-pay

## 2019-09-29 ENCOUNTER — Ambulatory Visit (INDEPENDENT_AMBULATORY_CARE_PROVIDER_SITE_OTHER): Payer: Medicare Other | Admitting: Sports Medicine

## 2019-09-29 DIAGNOSIS — S9030XA Contusion of unspecified foot, initial encounter: Secondary | ICD-10-CM

## 2019-09-29 DIAGNOSIS — M79671 Pain in right foot: Secondary | ICD-10-CM

## 2019-09-29 DIAGNOSIS — M7989 Other specified soft tissue disorders: Secondary | ICD-10-CM

## 2019-09-29 DIAGNOSIS — S93601D Unspecified sprain of right foot, subsequent encounter: Secondary | ICD-10-CM | POA: Diagnosis not present

## 2019-09-29 NOTE — Patient Instructions (Signed)
Try OTC salonpas, Aspercreme or Voltaren on toe for shooting pain as needed

## 2019-09-29 NOTE — Progress Notes (Signed)
Subjective:  Amy [name] is a 74 y.o. female patient who returns to the office for follow-up evaluation of right foot and ankle pain reports that her foot pain is feeling much better she can now walk and put pressure on the foot without it hurting has been wearing a normal shoe in the house and wearing her cam boot when she goes out.  Patient reports that she still gets a little sharp shooting pain occasionally over the medial aspect of the right great toe but otherwise is doing well reports that she has been wearing her compression sleeve and the swelling has gone down.  Patient denies any other pedal complaints at this time.  Patient Active Problem List   Diagnosis Date Noted  . Uncontrolled daytime somnolence 04/12/2019  . Snoring 01/22/2019  . Morbid obesity with body mass index of 45.0-49.9 in adult [facility]) 01/22/2019  . OSA (obstructive sleep apnea) 01/22/2019  . Wound dehiscence 07/22/2016  . Dyspnea 07/21/2016  . Acute encephalopathy 07/21/2016  . Anemia 07/21/2016  . Pancreatitis 07/02/2016  . SBO (small bowel obstruction) ([place]) 07/02/2016  . Long term current use of opiate analgesic 06/26/2016  . Long term prescription opiate use 06/26/2016  . Opiate use 06/26/2016  . Encounter for therapeutic drug level monitoring 06/26/2016  . Encounter for pain management planning 06/26/2016  . Chronic pain 06/26/2016  . Disturbance of skin sensation 06/26/2016  . Chronic low back pain (Location of Primary Source of Pain) (Bilateral) (R>L) 06/26/2016  . Chronic hip pain (Location of Secondary source of pain) (Bilateral) (R>L) 06/26/2016  . 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 Chronic shoulder pain (Location of Tertiary source of pain) (Bilateral) (R>L) 06/26/2016  . 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 Chronic knee pain (Bilateral) (R>L) 06/26/2016  . 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 Chronic neck pain (Right)  06/26/2016  . Lumbar spondylosis 06/26/2016  . Lumbar facet syndrome 06/26/2016  . Lumbar facet hypertrophy 06/26/2016  . 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 Neurogenic pain 06/26/2016  . Occipital headaches 06/26/2016  . 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 History of lumbar fusion 06/26/2016  . 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  . Neuropathic pain of both legs 10/13/2015  . Constipation 10/13/2015  . Complete rotator cuff tear of left shoulder 05/05/2014  . Depression 08/06/2013  . Hypokalemia 08/06/2013  . GERD (gastroesophageal reflux disease) 08/06/2013  . Spinal stenosis of lumbar region 08/06/2013  . Bladder spasm 08/06/2013  . Morbid obesity ([place]) 02/03/2013  . Lipoma of buttock s/p excision 02/20/2013 02/03/2013  . DYSPNEA 01/23/2008  . Obstructive sleep apnea 09/12/2007  . HTN (hypertension) 09/12/2007  . Allergic rhinitis 09/12/2007  . SLEEPINESS 09/12/2007  . ANGINA, HX OF 09/12/2007    Current Outpatient Medications on File Prior to Visit  Medication Sig Dispense Refill  . acetaminophen (TYLENOL) 500 MG tablet Take 1,000 mg by mouth every 6 (six) hours as needed for moderate pain.     Marland Kitchen azelaic acid (AZELEX) 20 % cream Apply 1 application topically 2 (two) times daily. After skin is thoroughly washed and patted dry, gently but thoroughly massage a thin film of azelaic acid cream into the affected area twice daily, in the morning and evening.    . B Complex Vitamins (B COMPLEX PO) Take by mouth.    . calcium-vitamin D (OSCAL WITH D) 500-200 MG-UNIT tablet Take 1 tablet by mouth daily with breakfast.    .  ciprofloxacin (CIPRO) 250 MG tablet TK 1 T PO Q 12 H FOR 7 DAYS    . cloNIDine (CATAPRES) 0.1 MG tablet Take 1 tablet (0.1 mg total) by mouth 3 (three) times daily. 60 tablet 11  . diclofenac sodium (VOLTAREN) 1 % GEL Apply 4 g topically 4 (four) times daily. 100 g 0  . diphenhydrAMINE (BENADRYL) 25 MG tablet Take 25  mg by mouth 2 (two) times daily as needed for allergies.    Marland Kitchen estradiol (ESTRACE) 0.1 MG/GM vaginal cream Place 1 Applicatorful vaginally 2 (two) times a week. (Patient taking differently: Place 1 Applicatorful vaginally 2 (two) times a week. Tuesday and Thursday) 42.5 g 12  . fluticasone (FLONASE) 50 MCG/ACT nasal spray Place 1 spray into both nostrils daily.  2  . gabapentin (NEURONTIN) 300 MG capsule Take 300 mg by mouth 2 (two) times daily.    . hydrALAZINE (APRESOLINE) 50 MG tablet Take 50 mg by mouth 3 (three) times daily.    . hydrochlorothiazide (HYDRODIURIL) 12.5 MG tablet Take 12.5 mg by mouth daily.    Marland Kitchen losartan (COZAAR) 100 MG tablet TK 1 T PO ONCE D    . meloxicam (MOBIC) 15 MG tablet Take 15 mg by mouth daily.    . Menthol-Methyl Salicylate (MUSCLE RUB EX) Apply 1 application topically 2 (two) times daily as needed (pain in neck and back).     . methocarbamol (ROBAXIN) 500 MG tablet Take 500 mg by mouth 2 (two) times daily as needed for muscle spasms.    . metoprolol (LOPRESSOR) 50 MG tablet Take 1 tablet (50 mg total) by mouth 2 (two) times daily.    Marland Kitchen NIFEdipine (PROCARDIA) 10 MG capsule Take 10 mg by mouth 3 (three) times daily.    . ondansetron (ZOFRAN-ODT) 4 MG disintegrating tablet DIS 1 T ON THE TONGUE TID PRN    . OVER THE COUNTER MEDICATION Take 2 tablets by mouth daily. Swiss Kriss OTC herbal laxative    . pantoprazole (PROTONIX) 40 MG tablet Take 80 mg by mouth daily.   0  . PARoxetine (PAXIL) 30 MG tablet Take 30 mg by mouth daily.    . potassium chloride SA (K-DUR,KLOR-CON) 20 MEQ tablet Take 20 mEq by mouth daily.    . predniSONE (STERAPRED UNI-PAK 21 TAB) 10 MG (21) TBPK tablet Take as directed 21 tablet 0  . tapentadol (NUCYNTA) 50 MG tablet Take 50 mg by mouth every 6 (six) hours as needed for moderate pain.    Marland Kitchen triamcinolone ointment (KENALOG) 0.5 % Apply 1 application topically 2 (two) times daily. To rash to feet 30 g 0  . vitamin B-12 (CYANOCOBALAMIN) 1000 MCG  tablet Take 1,000 mcg by mouth daily.    . vitamin E (VITAMIN E) 400 UNIT capsule Take 400 Units by mouth daily.     No current facility-administered medications on file prior to visit.    Allergies  Allergen Reactions  . Latex Other (See Comments)    Sores, blisters - reaction to gloves  . Crestor [Rosuvastatin Calcium]     Myalgias  . Penicillins Other (See Comments)    UNSPECIFIED REACTION  "Has taken amoxicillin and ampicillin with no reaction" Has patient had a PCN reaction causing immediate rash, facial/tongue/throat swelling, SOB or lightheadedness with hypotension: Unknown Has patient had a PCN reaction causing severe rash involving mucus membranes or skin necrosis: Unknown Has patient had a PCN reaction that required hospitalization: Unknown Has patient had a PCN reaction occurring within the last 10  years: No   . Tetanus Toxoids Rash  . Tomato Rash    Objective:  General: Alert and oriented x3 in no acute distress  Dermatology: No open lesions bilateral lower extremities, no webspace macerations, no ecchymosis bilateral, all nails x 10 are well manicured.  Vascular: Dorsalis Pedis and Posterior Tibial pedal pulses palpable, Capillary Fill Time 3 seconds,(+) pedal hair growth bilateral, trace edema swelling is controlled with compression stocking on right, Temperature gradient within normal limits. No acute ischemia.   Neurology: Johney Maine sensation intact via light touch bilateral.   Musculoskeletal: There is no reproducible tenderness noted to the right ankle.  There is very minimal reproducible pain to palpation at the level of the foot there is mild pain over the first MPJ that is occasional and shooting in nature otherwise no other symptoms.  Assessment and Plan: Problem List Items Addressed This Visit    None    Visit Diagnoses    Sprain of right foot, subsequent encounter    -  Primary   Contusion of foot, unspecified laterality, initial encounter       Swelling  of right foot       Right foot pain         -Complete examination performed -Discussed with patient since she is doing better may slowly transition out of her cam boot as tolerated -May continue with cane to help her stay stable when walking -Advised patient to try Salonpas, Aspercreme, or Voltaren to right great toe to use as needed for occasional pain -May continue with compression sleeve to help with swelling and advised patient if swelling continues to call her PCP to discuss possible need for diuretic -Patient to return to office as needed or sooner if problems or issues arise.  Landis Martins, DPM

## 2019-10-01 DIAGNOSIS — S060X0A Concussion without loss of consciousness, initial encounter: Secondary | ICD-10-CM | POA: Diagnosis not present

## 2019-10-01 DIAGNOSIS — M542 Cervicalgia: Secondary | ICD-10-CM | POA: Diagnosis not present

## 2019-11-16 ENCOUNTER — Ambulatory Visit: Payer: Medicare Other | Attending: Internal Medicine

## 2019-11-16 DIAGNOSIS — Z23 Encounter for immunization: Secondary | ICD-10-CM | POA: Insufficient documentation

## 2019-11-16 NOTE — Progress Notes (Signed)
   Covid-19 Vaccination Clinic  Name:  Amy [name]    MRN: [id number] DOB: [birth year]  11/16/2019  [name] was observed post Covid-19 immunization for 15 minutes without incidence. She was provided with Vaccine Information Sheet and instruction to access the V-Safe system.   [name] was instructed to call 911 with any severe reactions post vaccine: Marland Kitchen Difficulty breathing  . Swelling of your face and throat  . A fast heartbeat  . A bad rash all over your body  . Dizziness and weakness    Immunizations Administered    Name Date Dose VIS Date Route   Pfizer COVID-19 Vaccine 11/16/2019  8:45 AM 0.3 mL 09/04/2019 Intramuscular   Manufacturer: [place]   Lot: [id number]   [place]: [id number]

## 2019-11-25 ENCOUNTER — Other Ambulatory Visit: Payer: Self-pay

## 2019-11-25 ENCOUNTER — Encounter: Payer: Self-pay | Admitting: Family Medicine

## 2019-11-25 ENCOUNTER — Ambulatory Visit (INDEPENDENT_AMBULATORY_CARE_PROVIDER_SITE_OTHER): Payer: Medicare Other | Admitting: Family Medicine

## 2019-11-25 VITALS — BP 162/82 | HR 75 | Temp 97.3°F | Ht 65.0 in | Wt 310.2 lb

## 2019-11-25 DIAGNOSIS — Z9989 Dependence on other enabling machines and devices: Secondary | ICD-10-CM

## 2019-11-25 DIAGNOSIS — G4733 Obstructive sleep apnea (adult) (pediatric): Secondary | ICD-10-CM | POA: Diagnosis not present

## 2019-11-25 NOTE — Patient Instructions (Addendum)
Please continue using your CPAP regularly. While your insurance requires that you use CPAP at least 4 hours each night on 70% of the nights, I recommend, that you not skip any nights and use it throughout the night if you can. Getting used to CPAP and staying with the treatment long term does take time and patience and discipline. Untreated obstructive sleep apnea when it is moderate to severe can have an adverse impact on cardiovascular health and raise her risk for heart disease, arrhythmias, hypertension, congestive heart failure, stroke and diabetes. Untreated obstructive sleep apnea causes sleep disruption, nonrestorative sleep, and sleep deprivation. This can have an impact on your day to day functioning and cause daytime sleepiness and impairment of cognitive function, memory loss, mood disturbance, and problems focussing. Using CPAP regularly can improve these symptoms.   Follow up in 3 months   Sleep Apnea Sleep apnea affects breathing during sleep. It causes breathing to stop for a short time or to become shallow. It can also increase the risk of:  Heart attack.  Stroke.  Being very overweight (obese).  Diabetes.  Heart failure.  Irregular heartbeat. The goal of treatment is to help you breathe normally again. What are the causes? There are three kinds of sleep apnea:  Obstructive sleep apnea. This is caused by a blocked or collapsed airway.  Central sleep apnea. This happens when the brain does not send the right signals to the muscles that control breathing.  Mixed sleep apnea. This is a combination of obstructive and central sleep apnea. The most common cause of this condition is a collapsed or blocked airway. This can happen if:  Your throat muscles are too relaxed.  Your tongue and tonsils are too large.  You are overweight.  Your airway is too small. What increases the risk?  Being overweight.  Smoking.  Having a small airway.  Being older.  Being  female.  Drinking alcohol.  Taking medicines to calm yourself (sedatives or tranquilizers).  Having family members with the condition. What are the signs or symptoms?  Trouble staying asleep.  Being sleepy or tired during the day.  Getting angry a lot.  Loud snoring.  Headaches in the morning.  Not being able to focus your mind (concentrate).  Forgetting things.  Less interest in sex.  Mood swings.  Personality changes.  Feelings of sadness (depression).  Waking up a lot during the night to pee (urinate).  Dry mouth.  Sore throat. How is this diagnosed?  Your medical history.  A physical exam.  A test that is done when you are sleeping (sleep study). The test is most often done in a sleep lab but may also be done at home. How is this treated?   Sleeping on your side.  Using a medicine to get rid of mucus in your nose (decongestant).  Avoiding the use of alcohol, medicines to help you relax, or certain pain medicines (narcotics).  Losing weight, if needed.  Changing your diet.  Not smoking.  Using a machine to open your airway while you sleep, such as: ? An oral appliance. This is a mouthpiece that shifts your lower jaw forward. ? A CPAP device. This device blows air through a mask when you breathe out (exhale). ? An EPAP device. This has valves that you put in each nostril. ? A BPAP device. This device blows air through a mask when you breathe in (inhale) and breathe out.  Having surgery if other treatments do not work. It is   important to get treatment for sleep apnea. Without treatment, it can lead to:  High blood pressure.  Coronary artery disease.  In men, not being able to have an erection (impotence).  Reduced thinking ability. Follow these instructions at home: Lifestyle  Make changes that your doctor recommends.  Eat a healthy diet.  Lose weight if needed.  Avoid alcohol, medicines to help you relax, and some pain  medicines.  Do not use any products that contain nicotine or tobacco, such as cigarettes, e-cigarettes, and chewing tobacco. If you need help quitting, ask your doctor. General instructions  Take over-the-counter and prescription medicines only as told by your doctor.  If you were given a machine to use while you sleep, use it only as told by your doctor.  If you are having surgery, make sure to tell your doctor you have sleep apnea. You may need to bring your device with you.  Keep all follow-up visits as told by your doctor. This is important. Contact a doctor if:  The machine that you were given to use during sleep bothers you or does not seem to be working.  You do not get better.  You get worse. Get help right away if:  Your chest hurts.  You have trouble breathing in enough air.  You have an uncomfortable feeling in your back, arms, or stomach.  You have trouble talking.  One side of your body feels weak.  A part of your face is hanging down. These symptoms may be an emergency. Do not wait to see if the symptoms will go away. Get medical help right away. Call your local emergency services (911 in the [place] Do not drive yourself to the hospital. Summary  This condition affects breathing during sleep.  The most common cause is a collapsed or blocked airway.  The goal of treatment is to help you breathe normally while you sleep. This information is not intended to replace advice given to you by your health care provider. Make sure you discuss any questions you have with your health care provider. Document Revised: 06/27/2018 Document Reviewed: 05/06/2018 Elsevier Patient Education  2020 Elsevier Inc.  

## 2019-11-25 NOTE — Progress Notes (Signed)
PATIENT: Amy [name] DOB: 1946/08/09  REASON FOR VISIT: follow up HISTORY FROM: patient  Chief Complaint  Patient presents with  . Follow-up    Rm 2, alone, walks with cane.   . CPAP     HISTORY OF PRESENT ILLNESS: Today 11/25/19 Amy [name] is a 74 y.o. female here today for follow up for mild OSA with REM dependent apnea (AHI 26/hr). She admits that she is having difficulty with compliance.  There are nights where she falls asleep and forgets to place her mask.  She also reports that there are nights that her CPAP is not record and accurate time that she has used this machine.  She has not reached out to her DME company with this concern.  Otherwise she is doing well.  She feels that the mask fits well.  Compliance report dated 10/26/2019 11/25/2019 reveals that she used CPAP 16 of the last 30 days for compliance of 53%.  She used CPAP greater than 4 hours 12 of the last 30 days for compliance of 40%.  Average usage on days used was 6 hours and 45 minutes.  Residual AHI was 8.7 on 5 to 15 cm of water and an EPR of 3.  There was no significant leak noted.  HISTORY: (copied from my note on 05/28/2019)  Amy [name] is a 74 y.o. female here today for follow up of OSA on CPAP.  She reports that she is adjusting to CPAP therapy.  Unfortunately, she has recently lost her baby brother.  She has been more depressed and is grieving.  She has tried to be as consistent with use as possible.  Compliance report dated 04/27/2019 through 05/26/2019 reveals that she is used CPAP 23 of the last 30 days for compliance of 77%.  23 days she used CPAP greater than 4 hours for compliance of 77%.  Average usage was 7 hours and 13 minutes.  AHI was 2.3 on 5 to 15 cm of water and EPR of 3.  There was no significant leak.  She improvement in daytime sleepiness nursing there.  HISTORY: (copied from Dr Dohmeier's note on 01/22/2019)  [id number] V Lyles-Williamsis a 74  y.o.female, seen here as in a referral from Dr. Jaynee Eagles. Chief complaint according to patient :Untreated OSA-  Patient has had a PSG in lab at the [facility] location, about 12 years ago and was diagnosed with OSA but could not tolerate the prescribed CPAP at the time: " It almost killed me " She returned the device after 2-3 weeks. She felt suffocated.   Sleepandmedical history: small bowel obstruction, PVD, GERD, Neuropathy, painful- feet and legs. Asthma , DDD, headaches, glaucoma,Iron def. anemia.   Family sleep history:parents were snorers, especially father, none of her siblings were diagnosed with OSA. 6 brothers and 2 sisters. HTN is prevalent. Several have CAD an heart murmurs.  DM in both sides of the family. Mother had cervical cancer.  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 lives with husband and his cousin. .  Was trying to smoke while in college- quit at age 51.no ETOH,  Coffee 1-2 cups a day, drinks iced tea or soda when eating out- now now.  Sleep habits are as follows:dinner time is 5 -7 PM, 8-9 PM is bedtime. She watches TV and waits to fall asleep. I try to stay awake to watch my favorite at 12 midnight", then I cannot sleep.  Sleeps on left side, and 3-4 pillows. Bedroom is neither cool, nor quiet  nor dark. Her husband has moved to another bedroom. Stays asleep for 1-3 hours, and has nocturia around 3 AM and 5 AM. Rises at 10 AM. Total Sleep time has become 5 hours or less. Each bathroom break leads to a long struggle to go back to sleep. Woken some night by headaches, sharper, throbbing , they come and go more quickly.  Many mornings has a headache of dull, constant , not throbbing.  Sleep has shifted into the morning time over many years. Since 2004- date of retirement.  She used to take a mid day nap- 2 hours!   REVIEW OF SYSTEMS: Out of a complete 14 system review of symptoms, the patient complains only of the following symptoms, fatigue, chronic pain and all  other reviewed systems are negative.  Epworth Sleepiness Scale: 12  ALLERGIES: Allergies  Allergen Reactions  . Latex Other (See Comments)    Sores, blisters - reaction to gloves  . Crestor [Rosuvastatin Calcium]     Myalgias  . Penicillins Other (See Comments)    UNSPECIFIED REACTION  "Has taken amoxicillin and ampicillin with no reaction" Has patient had a PCN reaction causing immediate rash, facial/tongue/throat swelling, SOB or lightheadedness with hypotension: Unknown Has patient had a PCN reaction causing severe rash involving mucus membranes or skin necrosis: Unknown Has patient had a PCN reaction that required hospitalization: Unknown Has patient had a PCN reaction occurring within the last 10 years: No   . Tetanus Toxoids Rash  . Tomato Rash    HOME MEDICATIONS: Outpatient Medications Prior to Visit  Medication Sig Dispense Refill  . acetaminophen (TYLENOL) 500 MG tablet Take 1,000 mg by mouth every 6 (six) hours as needed for moderate pain.     Marland Kitchen azelaic acid (AZELEX) 20 % cream Apply 1 application topically 2 (two) times daily. After skin is thoroughly washed and patted dry, gently but thoroughly massage a thin film of azelaic acid cream into the affected area twice daily, in the morning and evening.    . B Complex Vitamins (B COMPLEX PO) Take by mouth.    . calcium-vitamin D (OSCAL WITH D) 500-200 MG-UNIT tablet Take 1 tablet by mouth daily with breakfast.    . cloNIDine (CATAPRES) 0.1 MG tablet Take 1 tablet (0.1 mg total) by mouth 3 (three) times daily. 60 tablet 11  . diclofenac sodium (VOLTAREN) 1 % GEL Apply 4 g topically 4 (four) times daily. 100 g 0  . diphenhydrAMINE (BENADRYL) 25 MG tablet Take 25 mg by mouth 2 (two) times daily as needed for allergies.    Marland Kitchen estradiol (ESTRACE) 0.1 MG/GM vaginal cream Place 1 Applicatorful vaginally 2 (two) times a week. (Patient taking differently: Place 1 Applicatorful vaginally 2 (two) times a week. Tuesday and Thursday) 42.5  g 12  . fluticasone (FLONASE) 50 MCG/ACT nasal spray Place 1 spray into both nostrils daily.  2  . gabapentin (NEURONTIN) 300 MG capsule Take 300 mg by mouth 2 (two) times daily.    . hydrALAZINE (APRESOLINE) 50 MG tablet Take 50 mg by mouth 3 (three) times daily.    . hydrochlorothiazide (HYDRODIURIL) 12.5 MG tablet Take 12.5 mg by mouth daily.    Marland Kitchen losartan (COZAAR) 100 MG tablet TK 1 T PO ONCE D    . meloxicam (MOBIC) 15 MG tablet Take 15 mg by mouth daily.    . Menthol-Methyl Salicylate (MUSCLE RUB EX) Apply 1 application topically 2 (two) times daily as needed (pain in neck and back).     Marland Kitchen  methocarbamol (ROBAXIN) 500 MG tablet Take 500 mg by mouth 2 (two) times daily as needed for muscle spasms.    . metoprolol (LOPRESSOR) 50 MG tablet Take 1 tablet (50 mg total) by mouth 2 (two) times daily.    Marland Kitchen NIFEdipine (PROCARDIA) 10 MG capsule Take 10 mg by mouth 3 (three) times daily.    . ondansetron (ZOFRAN-ODT) 4 MG disintegrating tablet DIS 1 T ON THE TONGUE TID PRN    . OVER THE COUNTER MEDICATION Take 2 tablets by mouth daily. Swiss Kriss OTC herbal laxative    . pantoprazole (PROTONIX) 40 MG tablet Take 80 mg by mouth daily.   0  . PARoxetine (PAXIL) 30 MG tablet Take 30 mg by mouth daily.    . potassium chloride SA (K-DUR,KLOR-CON) 20 MEQ tablet Take 20 mEq by mouth daily.    . tapentadol (NUCYNTA) 50 MG tablet Take 50 mg by mouth every 6 (six) hours as needed for moderate pain.    Marland Kitchen triamcinolone ointment (KENALOG) 0.5 % Apply 1 application topically 2 (two) times daily. To rash to feet 30 g 0  . vitamin B-12 (CYANOCOBALAMIN) 1000 MCG tablet Take 1,000 mcg by mouth daily.    . vitamin E (VITAMIN E) 400 UNIT capsule Take 400 Units by mouth daily.    . ciprofloxacin (CIPRO) 250 MG tablet TK 1 T PO Q 12 H FOR 7 DAYS    . predniSONE (STERAPRED UNI-PAK 21 TAB) 10 MG (21) TBPK tablet Take as directed 21 tablet 0   No facility-administered medications prior to visit.    PAST MEDICAL  HISTORY: Past Medical History:  Diagnosis Date  . Abdominal pain of unknown etiology 02/05/2016  . Anxiety   . Arthritis    ddd- RA  . Asthma    "sleeping asthma"  . Chronic back pain    lumbar steroid injection's  . Complication of anesthesia    Hard to wake up. Pt sts is took 3 days.  . Constipation   . Depression   . Dysphonia    intermittent "voice changes"  . Esophageal dysmotility   . Fibromyalgia   . Fibromyalgia   . GERD (gastroesophageal reflux disease)   . Glaucoma   . Headache   . High cholesterol    ? pt states her doctor told her to continue eating vegetables & fruit  . History of DVT (deep vein thrombosis) 1989   LEFT UPPER ARM  . History of hiatal hernia   . History of MRSA infection 2011  . Hypertension   . Irregular heart rate    "years ago"  . Low iron   . Lumbago   . Mild obstructive sleep apnea    per study 02-07-2006 - no cpap  . Neuropathy    feet  . Pelvic pain in female   . Peripheral vascular disease ([place])    "poor circulation"  . SBO (small bowel obstruction) ([place]) 06/2016  . Seasonal allergies   . Short of breath on exertion   . Urinary frequency   . Vaginal atrophy   . Weakness    both hands and feet    PAST SURGICAL HISTORY: Past Surgical History:  Procedure Laterality Date  . abdominal adhesions removed    . ABDOMINAL HYSTERECTOMY  1978  . BACK SURGERY    . BUNIONECTOMY Left 2008  . CATARACT EXTRACTION Bilateral 2017  . COLON SURGERY    . COLONOSCOPY    . CYSTO WITH HYDRODISTENSION N/A 07/12/2015   Procedure: CYSTOSCOPY/HYDRODISTENSION;  Surgeon: Bjorn Loser, MD;  Location[facility];  Service: Urology;  Laterality: N/A;  . DILATION AND CURETTAGE OF UTERUS    . ESOPHAGEAL MANOMETRY N/A 11/22/2014   Procedure: ESOPHAGEAL MANOMETRY (EM);  Surgeon: Winfield Cunas., MD;  Location: [facility];  Service: Endoscopy;  Laterality: N/A;  . ESOPHAGOGASTRODUODENOSCOPY N/A 07/15/2013   Procedure:  ESOPHAGOGASTRODUODENOSCOPY (EGD);  Surgeon: Winfield Cunas., MD;  Location: [facility];  Service: Endoscopy;  Laterality: N/A;  need xray  . EXPLORATORY LAPAROTOMY    . EYE SURGERY Bilateral    cataract surgery with lens implants  . HARDWARE REMOVAL N/A 01/02/2016   Procedure: Exploration of Lumbar Fusion,Removal of hardware Lumbar One-Two ;Redo Posterior Lumbar Fusion Lumbar One-Two;  Surgeon: Kary Kos, MD;  Location: [facility];  Service: Neurosurgery;  Laterality: N/A;  . ingrown toenail removal    . LAPAROSCOPIC CHOLECYSTECTOMY  01-11-2006  . LAPAROSCOPY N/A 07/06/2016   Procedure: LAPAROSCOPY DIAGNOSTIC EMERGENT TO OPEN;  Surgeon: Greer Pickerel, MD;  Location: [place];  Service: General;  Laterality: N/A;  . LAPAROTOMY N/A 07/06/2016   Procedure: EXPLORATORY LAPAROTOMY LYSIS OF ADHESIONS FOR 3 HOURS;  Surgeon: Greer Pickerel, MD;  Location: [place];  Service: General;  Laterality: N/A;  . lipoma removal    . LUMBAR FUSION  2014   L4 -- L5  . MASS EXCISION Left 02/20/2013   Procedure: EXCISION LEFT BUTTOCK  MASS;  Surgeon: Adin Hector, MD;  Location: [facility];  Service: General;  Laterality: Left;  . NOSE SURGERY  2007  . REMOVAL HARDWARE L4-L5/  BILATERAL LAMINECTOMY L2 - L5 AND FUSION  06-12-2011  . SHOULDER OPEN ROTATOR CUFF REPAIR Left 05/05/2014   Procedure: OPEN ACROMIONECTOMY AND OPEN REPAIR OF ROTATOR CUFF, TISSUEMEND GRAFT WITH ANCHOR ;  Surgeon: Tobi Bastos, MD;  Location: [facility];  Service: Orthopedics;  Laterality: Left;  . SPINE SURGERY     x6  . TONSILLECTOMY      FAMILY HISTORY: Family History  Problem Relation Age of Onset  . Hypertension Mother   . Cancer Mother        cervix  . Kidney disease Mother   . Osteoarthritis Mother   . Diabetes Mother   . Hypertension Sister   . Cancer Sister        polyps  . Kidney disease Brother   . Cancer Daughter        leukemia  . Hypertension Brother   . Liver cancer Brother   . Hypertension Father   . Colon cancer  Father   . Colon polyps Father   . Hypertension Brother   . Liver cancer Brother   . Aneurysm Maternal Grandmother   . Migraines Neg Hx   . Headache Neg Hx     SOCIAL HISTORY: Social History   Socioeconomic History  . Marital status: Married    Spouse name: Not on file  . Number of children: 1  . Years of education: Not on file  . Highest education level: Master's degree (e.g., MA, MS, MEng, MEd, MSW, MBA)  Occupational History  . Not on file  Tobacco Use  . Smoking status: Former Smoker    Quit date: 07/10/1969    Years since quitting: 50.4  . Smokeless tobacco: Never Used  Substance and Sexual Activity  . Alcohol use: Not Currently    Comment: rare, social  . Drug use: No  . Sexual activity: Not on file  Other Topics Concern  .  Not on file  Social History Narrative   Lives at home with husband and his cousin   Right handed   Caffeine: max 2 cups coffee per day   Social Determinants of Health   Financial Resource Strain:   . Difficulty of Paying Living Expenses: Not on file  Food Insecurity:   . Worried About Charity fundraiser in the Last Year: Not on file  . Ran Out of Food in the Last Year: Not on file  Transportation Needs:   . Lack of Transportation (Medical): Not on file  . Lack of Transportation (Non-Medical): Not on file  Physical Activity:   . Days of Exercise per Week: Not on file  . Minutes of Exercise per Session: Not on file  Stress:   . Feeling of Stress : Not on file  Social Connections:   . Frequency of Communication with Friends and Family: Not on file  . Frequency of Social Gatherings with Friends and Family: Not on file  . Attends Religious Services: Not on file  . Active Member of Clubs or Organizations: Not on file  . Attends Archivist Meetings: Not on file  . Marital Status: Not on file  Intimate Partner Violence:   . Fear of Current or Ex-Partner: Not on file  . Emotionally Abused: Not on file  . Physically Abused: Not  on file  . Sexually Abused: Not on file      PHYSICAL EXAM  Vitals:   11/25/19 0843  BP: (!) 162/82  Pulse: 75  Temp: (!) 97.3 F (36.3 C)  SpO2: 94%  Weight: (!) 310 lb 3.2 oz (140.7 kg)  Height: 5\' 5"  (1.651 m)   Body mass index is 51.62 kg/m.  Generalized: Well developed, in no acute distress  Cardiology: normal rate and rhythm, no murmur noted Respiratory: Clear to auscultation bilaterally Neurological examination  Mentation: Alert oriented to time, place, history taking. Follows all commands speech and language fluent Cranial nerve II-XII: Pupils were equal round reactive to light. Extraocular movements were full, visual field were full  Motor: The motor testing reveals 4 over 5 strength of all 4 extremities. Good symmetric motor tone is noted throughout.   Gait and station: Gait is wide but stable with single prong cane  DIAGNOSTIC DATA (LABS, IMAGING, TESTING) - I reviewed patient records, labs, notes, testing and imaging myself where available.  No flowsheet data found.   Lab Results  Component Value Date   WBC 6.6 03/30/2019   HGB 13.7 03/30/2019   HCT 42.5 03/30/2019   MCV 93.4 03/30/2019   PLT 287 03/30/2019      Component Value Date/Time   NA 136 03/30/2019 1832   NA 137 09/14/2016 0000   K 4.3 03/30/2019 1832   CL 102 03/30/2019 1832   CO2 26 03/30/2019 1832   GLUCOSE 86 03/30/2019 1832   BUN 13 03/30/2019 1832   BUN 13 09/14/2016 0000   CREATININE 1.14 (H) 03/30/2019 1832   CALCIUM 8.9 03/30/2019 1832   PROT 7.2 04/05/2018 1342   ALBUMIN 3.4 (L) 04/05/2018 1342   AST 20 04/05/2018 1342   ALT 15 04/05/2018 1342   ALKPHOS 133 (H) 04/05/2018 1342   BILITOT 0.3 04/05/2018 1342   GFRNONAA 48 (L) 03/30/2019 1832   GFRAA 55 (L) 03/30/2019 1832   Lab Results  Component Value Date   CHOL 165 07/03/2016   HDL 82 07/03/2016   LDLCALC 75 07/03/2016   TRIG 148 07/27/2016   CHOLHDL 2.0  07/03/2016   No results found for: HGBA1C Lab Results    Component Value Date   VITAMINB12 2,661 (H) 06/27/2016   Lab Results  Component Value Date   TSH 2.825 07/07/2016       ASSESSMENT AND PLAN 74 y.o. year old female  has a past medical history of Abdominal pain of unknown etiology (02/05/2016), Anxiety, Arthritis, Asthma, Chronic back pain, Complication of anesthesia, Constipation, Depression, Dysphonia, Esophageal dysmotility, Fibromyalgia, Fibromyalgia, GERD (gastroesophageal reflux disease), Glaucoma, Headache, High cholesterol, History of DVT (deep vein thrombosis) (1989), History of hiatal hernia, History of MRSA infection (2011), Hypertension, Irregular heart rate, Low iron, Lumbago, Mild obstructive sleep apnea, Neuropathy, Pelvic pain in female, Peripheral vascular disease ([place]), SBO (small bowel obstruction) ([place]) (06/2016), Seasonal allergies, Short of breath on exertion, Urinary frequency, Vaginal atrophy, and Weakness. here with     ICD-10-CM   1. OSA on CPAP  G47.33    Z99.89     Jahlisa admits that she has had more difficulty with compliance since last being seen.  We have discussed multiple options to improve compliance including setting an alarm if she is concerned she may fall asleep or placing CPAP while watching TV in the event she falls asleep.  I have also encouraged her to reach out to her DME company if she feels that her CPAP machine is not accurately recording data.  I have discussed with her concerns of elevated AHI.  I feel that this would improve with compliance.  Pressure settings are appropriate.  We have also discussed relationship of chronic pain medications and sleep apnea.  She was encouraged to continue using CPAP nightly and for greater than 4 hours each night.  I have encouraged her to follow-up closely with her primary care provider.  She reports that blood pressures are typically around 150-160/80-90.  She is on multiple hypertensive agents.  We have discussed adequate hydration, well-balanced low-sodium diet  and regular exercise.  She will follow-up with me in 3 months, sooner if needed.  She verbalizes understanding and agreement with this plan.   No orders of the defined types were placed in this encounter.    No orders of the defined types were placed in this encounter.     I spent 15 minutes with the patient. 50% of this time was spent counseling and educating patient on plan of care and medications.    Debbora Presto, FNP-C 11/25/2019, 9:12 AM [facility] [address] [place], [place] [address] [phone]

## 2019-12-08 DIAGNOSIS — M48062 Spinal stenosis, lumbar region with neurogenic claudication: Secondary | ICD-10-CM | POA: Diagnosis not present

## 2019-12-08 DIAGNOSIS — M961 Postlaminectomy syndrome, not elsewhere classified: Secondary | ICD-10-CM | POA: Diagnosis not present

## 2019-12-08 DIAGNOSIS — M542 Cervicalgia: Secondary | ICD-10-CM | POA: Diagnosis not present

## 2019-12-09 ENCOUNTER — Ambulatory Visit: Payer: Medicare Other | Attending: Internal Medicine

## 2019-12-09 DIAGNOSIS — Z23 Encounter for immunization: Secondary | ICD-10-CM

## 2019-12-09 NOTE — Progress Notes (Signed)
   Covid-19 Vaccination Clinic  Name:  Amy [name]    MRN: [id number] DOB: [birth year]  12/09/2019  Amy [name] was observed post Covid-19 immunization for 15 minutes without incident. She was provided with Vaccine Information Sheet and instruction to access the V-Safe system.   Amy [name] was instructed to call 911 with any severe reactions post vaccine: Marland Kitchen Difficulty breathing  . Swelling of face and throat  . A fast heartbeat  . A bad rash all over body  . Dizziness and weakness   Immunizations Administered    Name Date Dose VIS Date Route   Pfizer COVID-19 Vaccine 12/09/2019  8:31 AM 0.3 mL 09/04/2019 Intramuscular   Manufacturer: [place]   Lot: [id number]   [place]: [id number]

## 2019-12-31 ENCOUNTER — Other Ambulatory Visit: Payer: Self-pay | Admitting: Neurosurgery

## 2019-12-31 DIAGNOSIS — M5440 Lumbago with sciatica, unspecified side: Secondary | ICD-10-CM | POA: Diagnosis not present

## 2019-12-31 DIAGNOSIS — I10 Essential (primary) hypertension: Secondary | ICD-10-CM | POA: Diagnosis not present

## 2019-12-31 DIAGNOSIS — Z6841 Body Mass Index (BMI) 40.0 and over, adult: Secondary | ICD-10-CM | POA: Diagnosis not present

## 2020-01-07 ENCOUNTER — Other Ambulatory Visit: Payer: Self-pay | Admitting: Neurosurgery

## 2020-01-07 DIAGNOSIS — M5440 Lumbago with sciatica, unspecified side: Secondary | ICD-10-CM

## 2020-01-19 ENCOUNTER — Ambulatory Visit
Admission: RE | Admit: 2020-01-19 | Discharge: 2020-01-19 | Disposition: A | Discharge status: Home / Self Care | Payer: Medicare Other | Source: Ambulatory Visit | Attending: Neurosurgery | Admitting: Neurosurgery

## 2020-01-19 ENCOUNTER — Encounter: Payer: Self-pay | Admitting: Sports Medicine

## 2020-01-19 ENCOUNTER — Other Ambulatory Visit: Payer: Self-pay

## 2020-01-19 ENCOUNTER — Ambulatory Visit (INDEPENDENT_AMBULATORY_CARE_PROVIDER_SITE_OTHER): Payer: Medicare Other | Admitting: Sports Medicine

## 2020-01-19 VITALS — Temp 97.8°F

## 2020-01-19 DIAGNOSIS — R269 Unspecified abnormalities of gait and mobility: Secondary | ICD-10-CM

## 2020-01-19 DIAGNOSIS — M5440 Lumbago with sciatica, unspecified side: Secondary | ICD-10-CM

## 2020-01-19 DIAGNOSIS — M779 Enthesopathy, unspecified: Secondary | ICD-10-CM | POA: Diagnosis not present

## 2020-01-19 DIAGNOSIS — M5430 Sciatica, unspecified side: Secondary | ICD-10-CM | POA: Diagnosis not present

## 2020-01-19 DIAGNOSIS — R600 Localized edema: Secondary | ICD-10-CM

## 2020-01-19 DIAGNOSIS — M2550 Pain in unspecified joint: Secondary | ICD-10-CM | POA: Diagnosis not present

## 2020-01-19 DIAGNOSIS — M79671 Pain in right foot: Secondary | ICD-10-CM | POA: Diagnosis not present

## 2020-01-19 DIAGNOSIS — M79672 Pain in left foot: Secondary | ICD-10-CM

## 2020-01-19 MED ORDER — PREDNISONE 10 MG (21) PO TBPK
ORAL_TABLET | ORAL | 0 refills | Status: DC
Start: 2020-01-19 — End: 2021-01-16

## 2020-01-19 NOTE — Patient Instructions (Signed)
Asperceme, Salon pas or Voltaren pain cream OTC

## 2020-01-19 NOTE — Progress Notes (Signed)
Subjective:  Amy [name] is a 74 y.o. female patient who returns to the office for follow-up evaluation of right>left foot and ankle pain reports that her foot pain has flare back up and radiates up her legs. Reports that she has not discussed with PCP about swelling and has not tried pain creams. Denies new injury or trauma.  Patient denies any other pedal complaints at this time.  Patient Active Problem List   Diagnosis Date Noted  . Uncontrolled daytime somnolence 04/12/2019  . Snoring 01/22/2019  . Morbid obesity with body mass index of 45.0-49.9 in adult [facility]) 01/22/2019  . OSA on CPAP 01/22/2019  . Wound dehiscence 07/22/2016  . Dyspnea 07/21/2016  . Acute encephalopathy 07/21/2016  . Anemia 07/21/2016  . Pancreatitis 07/02/2016  . SBO (small bowel obstruction) ([place]) 07/02/2016  . Long term current use of opiate analgesic 06/26/2016  . Long term prescription opiate use 06/26/2016  . Opiate use 06/26/2016  . Encounter for therapeutic drug level monitoring 06/26/2016  . Encounter for pain management planning 06/26/2016  . Chronic pain 06/26/2016  . Disturbance of skin sensation 06/26/2016  . Chronic low back pain (Location of Primary Source of Pain) (Bilateral) (R>L) 06/26/2016  . Chronic hip pain (Location of Secondary source of pain) (Bilateral) (R>L) 06/26/2016  . 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 Chronic shoulder pain (Location of Tertiary source of pain) (Bilateral) (R>L) 06/26/2016  . 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 Chronic knee pain (Bilateral) (R>L) 06/26/2016  . 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 Chronic neck pain (Right) 06/26/2016  . Lumbar spondylosis 06/26/2016  . Lumbar facet syndrome 06/26/2016  . Lumbar facet hypertrophy 06/26/2016  . 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 Neurogenic pain 06/26/2016  . Occipital headaches 06/26/2016  . 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 History of lumbar fusion 06/26/2016  . 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  . Neuropathic pain of both legs 10/13/2015  . Constipation 10/13/2015  . Complete rotator cuff tear of left shoulder 05/05/2014  . Depression 08/06/2013  . Hypokalemia 08/06/2013  . GERD (gastroesophageal reflux disease) 08/06/2013  . Spinal stenosis of lumbar region 08/06/2013  . Bladder spasm 08/06/2013  . Morbid obesity ([place]) 02/03/2013  . Lipoma of buttock s/p excision 02/20/2013 02/03/2013  . DYSPNEA 01/23/2008  . Obstructive sleep apnea 09/12/2007  . HTN (hypertension) 09/12/2007  . Allergic rhinitis 09/12/2007  . SLEEPINESS 09/12/2007  . ANGINA, HX OF 09/12/2007    Current Outpatient Medications on File Prior to Visit  Medication Sig Dispense Refill  . acetaminophen (TYLENOL) 500 MG tablet Take 1,000 mg by mouth every 6 (six) hours as needed for moderate pain.     Marland Kitchen azelaic acid (AZELEX) 20 % cream Apply 1 application topically 2 (two) times daily. After skin is thoroughly washed and patted dry, gently but thoroughly massage a thin film of azelaic acid cream into the affected area twice daily, in the morning and evening.    . B Complex Vitamins (B COMPLEX PO) Take by mouth.    . calcium-vitamin D (OSCAL WITH D) 500-200 MG-UNIT tablet Take 1 tablet by mouth daily with breakfast.    . cloNIDine (CATAPRES) 0.1 MG tablet Take 1 tablet (0.1 mg total) by mouth 3 (three) times daily. 60 tablet 11  . diclofenac sodium (VOLTAREN) 1 % GEL Apply 4 g topically 4 (four) times daily. 100 g 0  . diphenhydrAMINE (BENADRYL) 25 MG tablet Take  25 mg by mouth 2 (two) times daily as needed for allergies.    Marland Kitchen estradiol (ESTRACE) 0.1 MG/GM vaginal cream Place 1 Applicatorful vaginally 2 (two) times a week. (Patient taking differently: Place 1 Applicatorful vaginally 2 (two) times a week. Tuesday and Thursday) 42.5 g 12  . fluticasone (FLONASE) 50 MCG/ACT nasal spray Place 1  spray into both nostrils daily.  2  . gabapentin (NEURONTIN) 600 MG tablet Take 600 mg by mouth 2 (two) times daily.    . hydrALAZINE (APRESOLINE) 50 MG tablet Take 50 mg by mouth 3 (three) times daily.    . hydrochlorothiazide (HYDRODIURIL) 12.5 MG tablet Take 12.5 mg by mouth daily.    Marland Kitchen losartan (COZAAR) 100 MG tablet TK 1 T PO ONCE D    . meloxicam (MOBIC) 15 MG tablet Take 15 mg by mouth daily.    . Menthol-Methyl Salicylate (MUSCLE RUB EX) Apply 1 application topically 2 (two) times daily as needed (pain in neck and back).     . methocarbamol (ROBAXIN) 500 MG tablet Take 500 mg by mouth 2 (two) times daily as needed for muscle spasms.    . metoprolol (LOPRESSOR) 50 MG tablet Take 1 tablet (50 mg total) by mouth 2 (two) times daily.    Marland Kitchen NIFEdipine (PROCARDIA) 10 MG capsule Take 10 mg by mouth 3 (three) times daily.    . ondansetron (ZOFRAN-ODT) 4 MG disintegrating tablet DIS 1 T ON THE TONGUE TID PRN    . OVER THE COUNTER MEDICATION Take 2 tablets by mouth daily. Swiss Kriss OTC herbal laxative    . pantoprazole (PROTONIX) 40 MG tablet Take 80 mg by mouth daily.   0  . PARoxetine (PAXIL) 30 MG tablet Take 30 mg by mouth daily.    . potassium chloride SA (K-DUR,KLOR-CON) 20 MEQ tablet Take 20 mEq by mouth daily.    . tapentadol (NUCYNTA) 50 MG tablet Take 50 mg by mouth every 6 (six) hours as needed for moderate pain.    Marland Kitchen triamcinolone ointment (KENALOG) 0.5 % Apply 1 application topically 2 (two) times daily. To rash to feet 30 g 0  . vitamin B-12 (CYANOCOBALAMIN) 1000 MCG tablet Take 1,000 mcg by mouth daily.    . vitamin E (VITAMIN E) 400 UNIT capsule Take 400 Units by mouth daily.     No current facility-administered medications on file prior to visit.    Allergies  Allergen Reactions  . Latex Other (See Comments)    Sores, blisters - reaction to gloves  . Crestor [Rosuvastatin Calcium]     Myalgias  . Penicillins Other (See Comments)    UNSPECIFIED REACTION  "Has taken  amoxicillin and ampicillin with no reaction" Has patient had a PCN reaction causing immediate rash, facial/tongue/throat swelling, SOB or lightheadedness with hypotension: Unknown Has patient had a PCN reaction causing severe rash involving mucus membranes or skin necrosis: Unknown Has patient had a PCN reaction that required hospitalization: Unknown Has patient had a PCN reaction occurring within the last 10 years: No   . Tetanus Toxoids Rash  . Tomato Rash    Objective:  General: Alert and oriented x3 in no acute distress  Dermatology: No open lesions bilateral lower extremities, no webspace macerations, no ecchymosis bilateral, all nails x 10 are well manicured.  Vascular: Dorsalis Pedis and Posterior Tibial pedal pulses palpable, Capillary Fill Time 3 seconds,(+) pedal hair growth bilateral, 1+ pitting edema bilateral, Temperature gradient within normal limits. No acute ischemia.   Neurology: Gross sensation intact  via light touch bilateral.   Musculoskeletal: There is reproducible tenderness noted to the right ankle and foot all over as well as on the left. Limited motion due to pain. Gait instability due to pain and swelling.  Assessment and Plan: Problem List Items Addressed This Visit    None    Visit Diagnoses    Tendonitis    -  Primary   Arthralgia, unspecified joint       Left foot pain       Right foot pain       Edema of both lower extremities       Gait abnormality         -Complete examination performed -Rx Prednisone to help with acute flare -Dispensed Surgigrip sleeves for edema controol -May resume cam boot as tolerated on right  -May continue with cane to help her stay stable when walking -Advised patient to try Salonpas, Aspercreme, or Voltaren to get this OTC  -Advised PCP follow up re: swelling possible diuretic meds to help -Patient to return to office as needed or sooner if problems or issues arise.  Landis Martins, DPM

## 2020-01-26 DIAGNOSIS — M5440 Lumbago with sciatica, unspecified side: Secondary | ICD-10-CM | POA: Diagnosis not present

## 2020-01-26 DIAGNOSIS — M533 Sacrococcygeal disorders, not elsewhere classified: Secondary | ICD-10-CM | POA: Diagnosis not present

## 2020-02-02 DIAGNOSIS — M533 Sacrococcygeal disorders, not elsewhere classified: Secondary | ICD-10-CM | POA: Diagnosis not present

## 2020-02-11 DIAGNOSIS — Z800 Family history of malignant neoplasm of digestive organs: Secondary | ICD-10-CM | POA: Diagnosis not present

## 2020-02-11 DIAGNOSIS — Z1211 Encounter for screening for malignant neoplasm of colon: Secondary | ICD-10-CM | POA: Diagnosis not present

## 2020-02-11 DIAGNOSIS — R1310 Dysphagia, unspecified: Secondary | ICD-10-CM | POA: Diagnosis not present

## 2020-02-25 ENCOUNTER — Encounter: Payer: Self-pay | Admitting: Family Medicine

## 2020-02-25 ENCOUNTER — Other Ambulatory Visit: Payer: Self-pay

## 2020-02-25 ENCOUNTER — Ambulatory Visit (INDEPENDENT_AMBULATORY_CARE_PROVIDER_SITE_OTHER): Payer: Medicare Other | Admitting: Family Medicine

## 2020-02-25 VITALS — BP 159/79 | HR 86 | Ht 65.0 in | Wt 295.0 lb

## 2020-02-25 DIAGNOSIS — Z9989 Dependence on other enabling machines and devices: Secondary | ICD-10-CM | POA: Diagnosis not present

## 2020-02-25 DIAGNOSIS — G4733 Obstructive sleep apnea (adult) (pediatric): Secondary | ICD-10-CM | POA: Diagnosis not present

## 2020-02-25 IMAGING — CT CT CERVICAL SPINE W/O CM
1 series · 8 of 14 positions shown, 10 images · non-contrast
Comparison: 10/29/2018

CLINICAL DATA: Fell 2 weeks ago with headache and neck pain.

EXAM:
CT HEAD WITHOUT CONTRAST
CT CERVICAL SPINE WITHOUT CONTRAST
TECHNIQUE: Multidetector CT imaging of the head and cervical spine was
performed following the standard protocol without intravenous
contrast. Multiplanar CT image reconstructions of the cervical spine
were also generated.

[Series 10: c-spine 2.00 hr60 s3 orthogonal axial · axial · 0.23mm/px · z∈[-680,-570]mm · 8 of 76 slices shown, 10 images]
[im 6/76  soft-tissue]
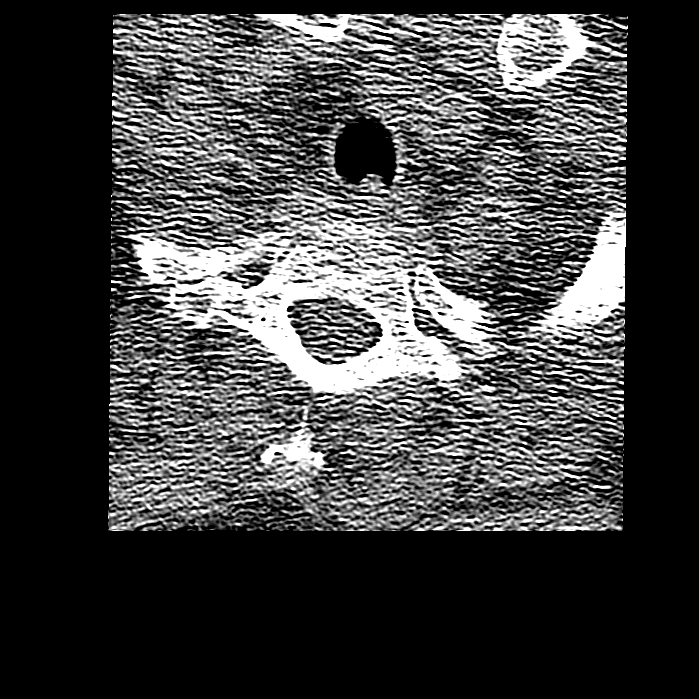
[im 6/76  bone]
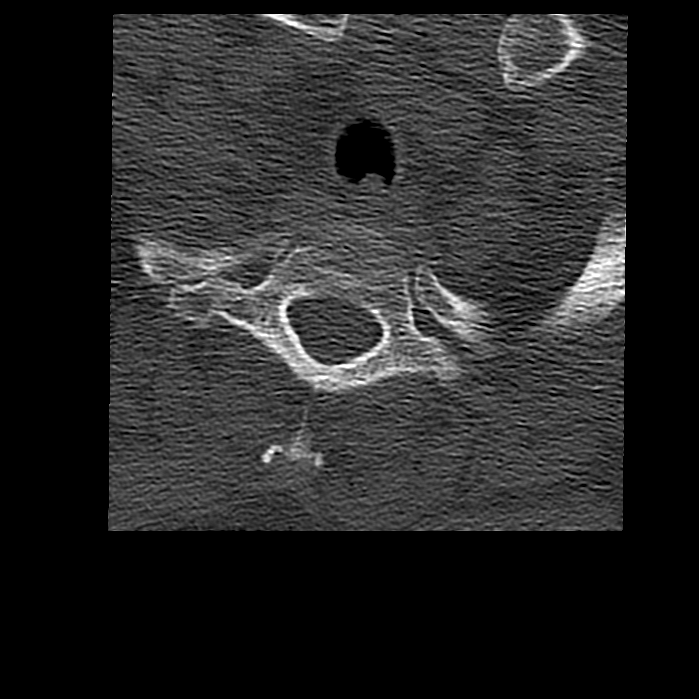
[im 18/76  bone]
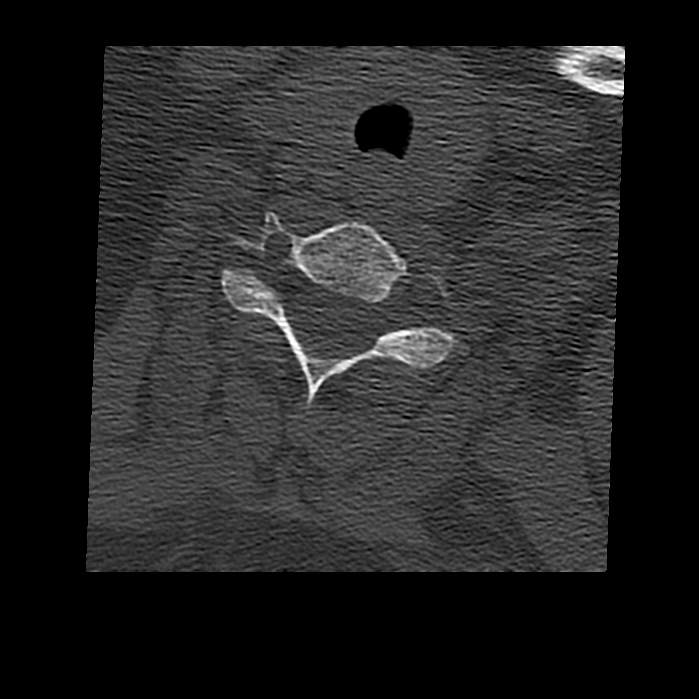
[im 24/76  bone]
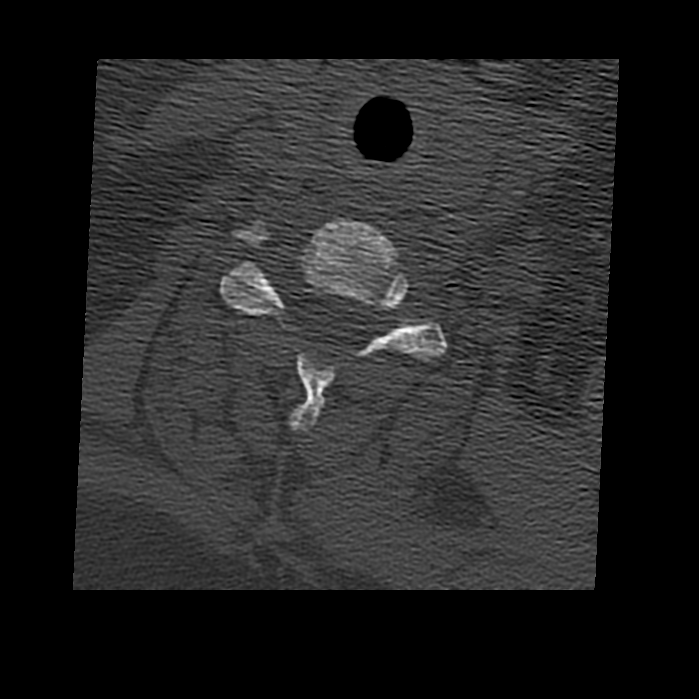
[im 35/76  bone]
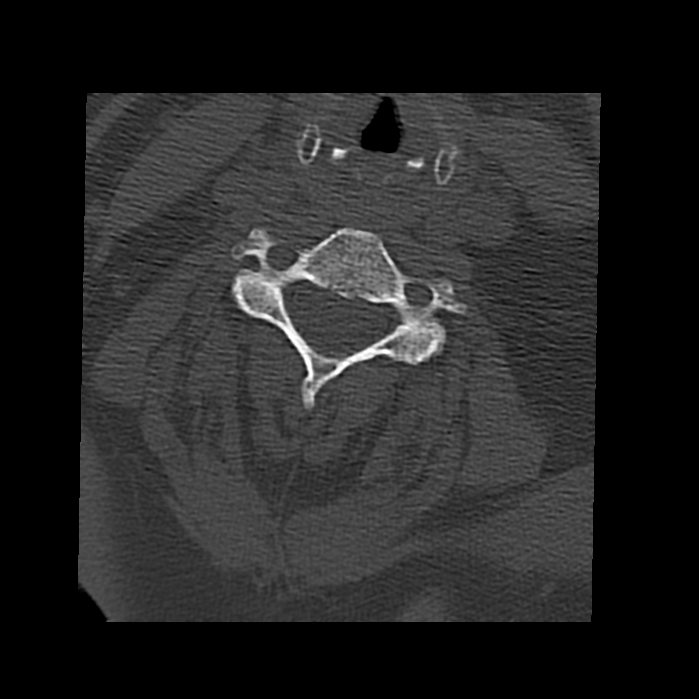
[im 41/76  soft-tissue]
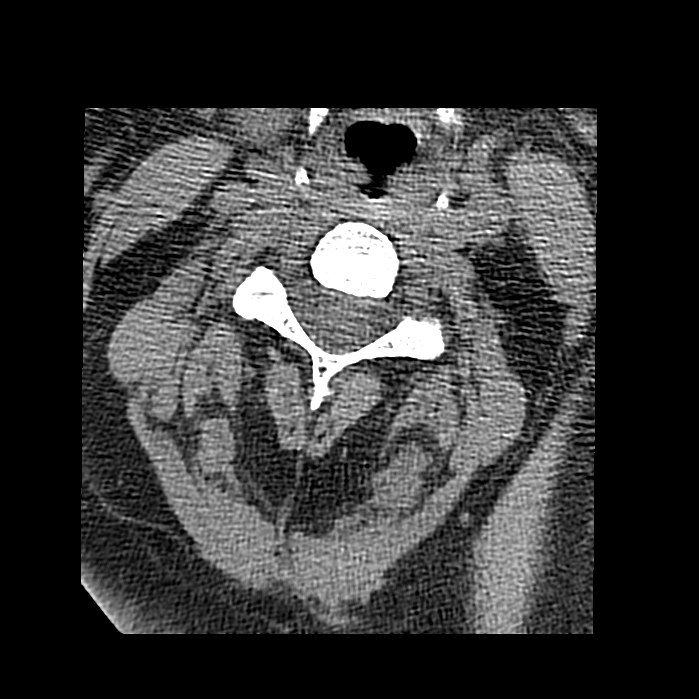
[im 41/76  bone]
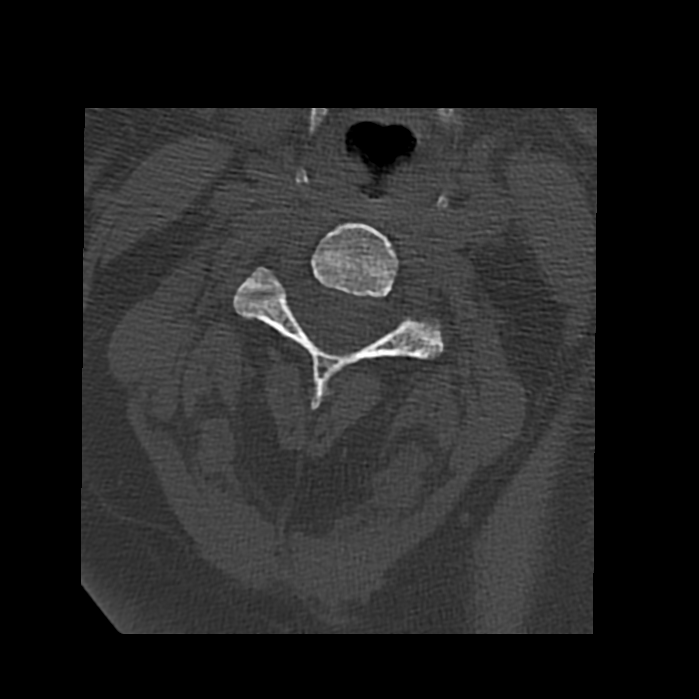
[im 52/76  bone]
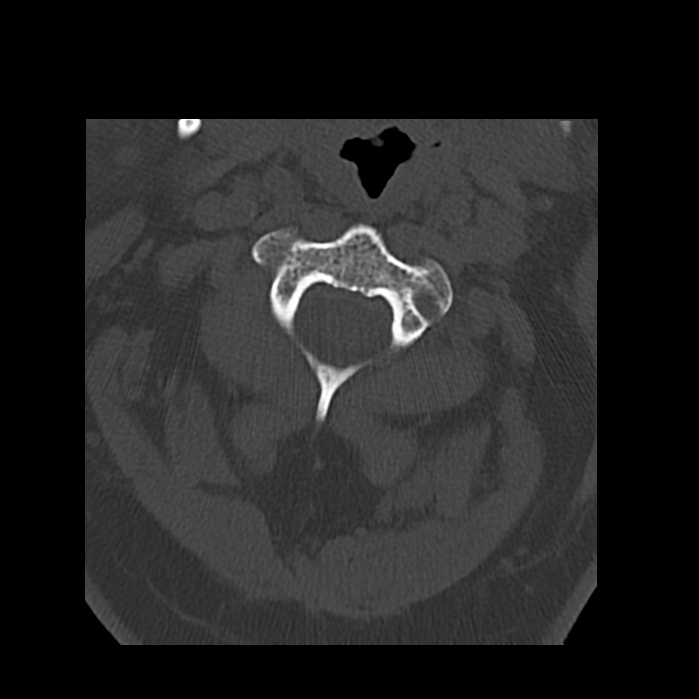
[im 58/76  bone]
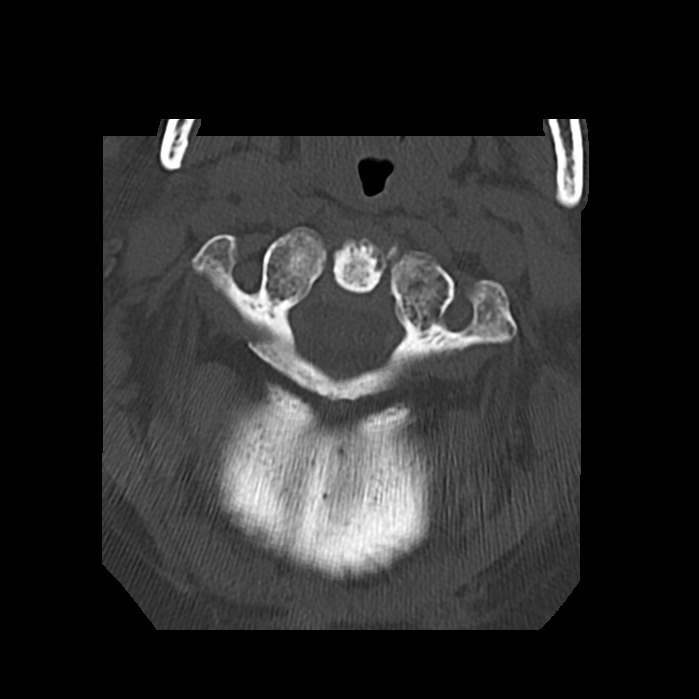
[im 70/76  bone]
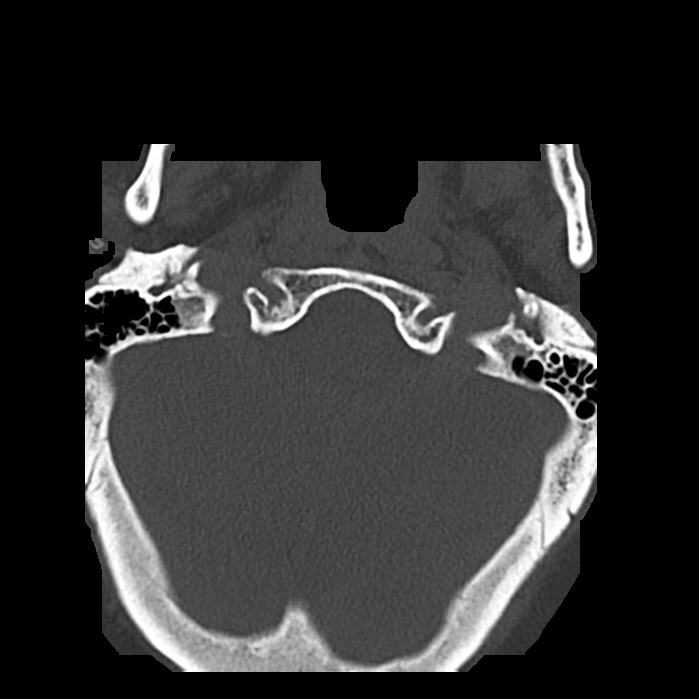

[8 of 14 positions shown; findings below may reference images not displayed]

FINDINGS: CT HEAD FINDINGS

Brain: Mild age related volume loss. Mild chronic small-vessel
ischemic change of the basal ganglia and hemispheric white matter.
No sign of recent infarction, mass lesion, hemorrhage, hydrocephalus
or extra-axial collection.

Vascular: There is atherosclerotic calcification of the major
vessels at the base of the brain.

Skull: Negative

Sinuses/Orbits: Clear/normal

Other: None

CT CERVICAL SPINE FINDINGS

Alignment: Normal

Skull base and vertebrae: No traumatic finding.

Soft tissues and spinal canal: Negative

Disc levels: Foramen magnum is widely patent. Ordinary arthritis at
the C1-2 articulation which could relate to osteoarthritis or CPPD
arthritis. No encroachment upon the neural structures.

From C2-3 through C7-T1, there is a remarkably normal appearance of
the spine for a person of this age. No sign of degenerative disc
disease or degenerative facet disease. No canal or foraminal
stenosis.

Upper chest: Negative

Other: None
IMPRESSION: Head CT: No acute or traumatic finding. Minimal small vessel change
of the hemispheric white matter and basal ganglia regions.

Cervical spine CT: No acute or traumatic finding. Ordinary arthritis
at the C1-2 articulation. The remainder of the cervical spine has a
remarkably normal appearance for a person of this age.

## 2020-02-25 IMAGING — CT CT HEAD W/O CM
4 series · 16 of 47 positions shown, 18 images · non-contrast
Comparison: 10/29/2018

CLINICAL DATA: Fell 2 weeks ago with headache and neck pain.

EXAM:
CT HEAD WITHOUT CONTRAST
CT CERVICAL SPINE WITHOUT CONTRAST
TECHNIQUE: Multidetector CT imaging of the head and cervical spine was
performed following the standard protocol without intravenous
contrast. Multiplanar CT image reconstructions of the cervical spine
were also generated.

[Series 2: head 5.00 hr40 s3 axial ibhc · axial · 0.47mm/px · z∈[-569,-449]mm · 6 of 34 slices shown, 8 images]
[im 5/34  brain]
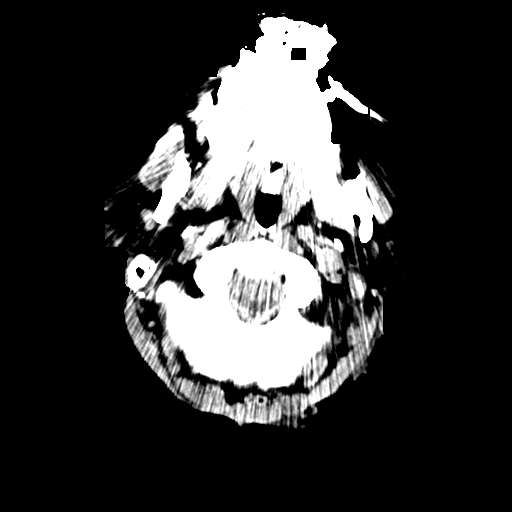
[im 5/34  bone]
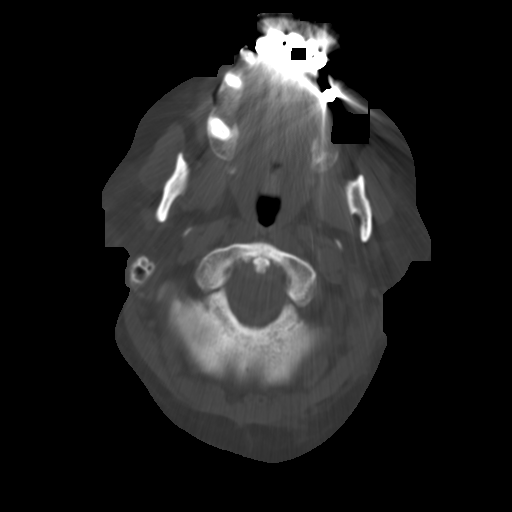
[im 10/34  brain]
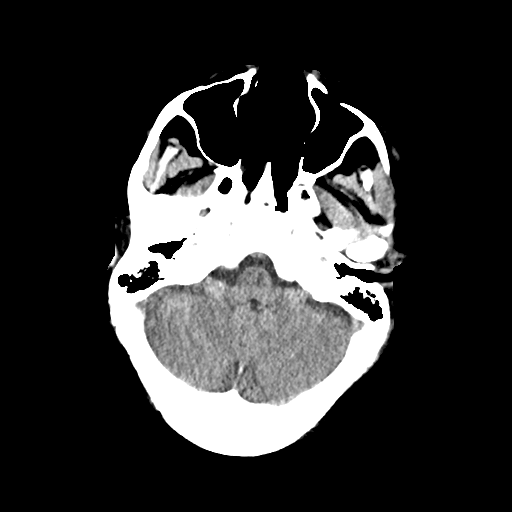
[im 15/34  brain]
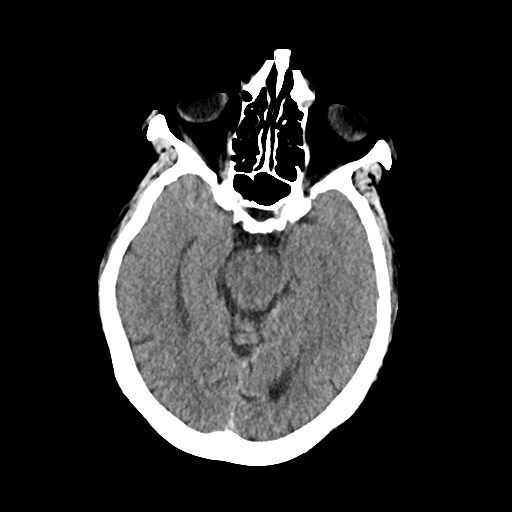
[im 19/34  brain]
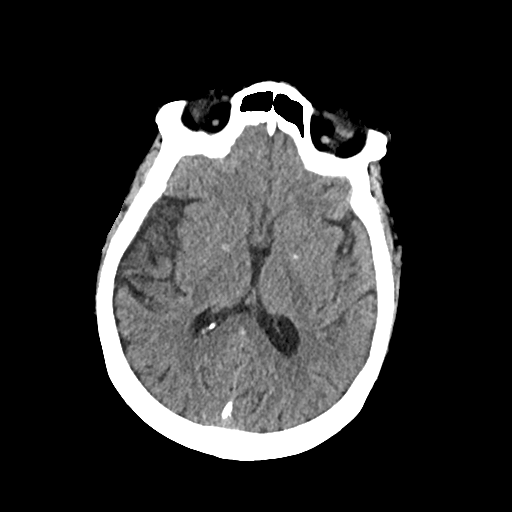
[im 24/34  brain]
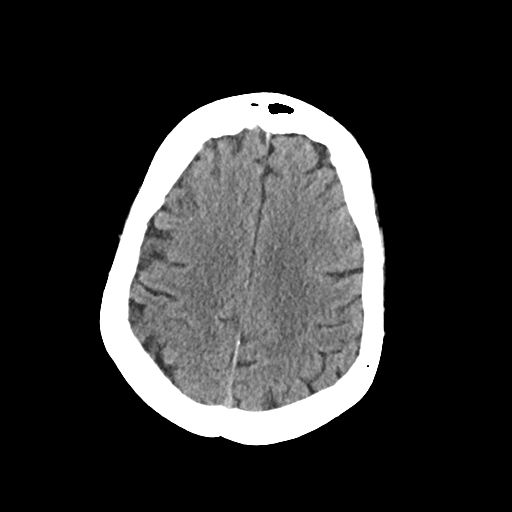
[im 24/34  bone]
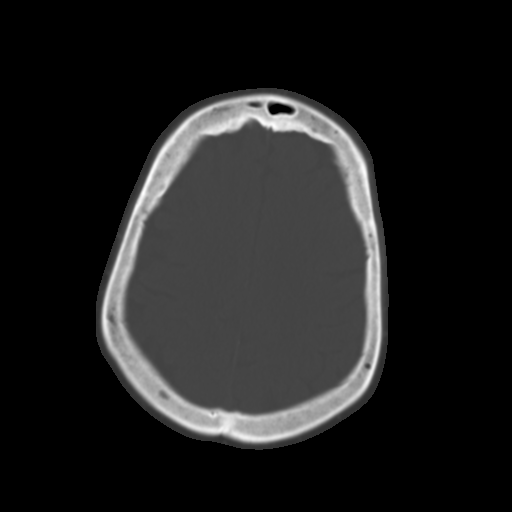
[im 29/34  brain]
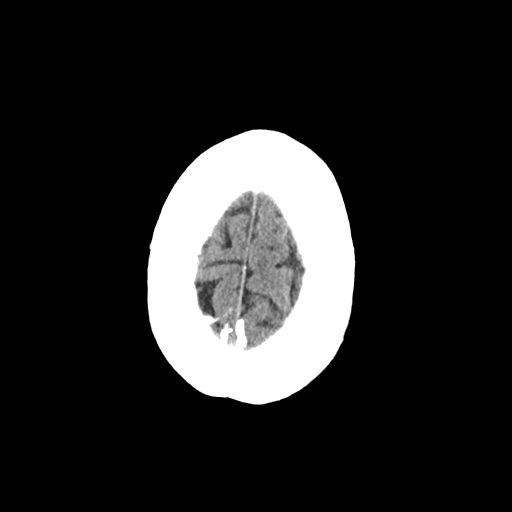

[Series 3: head 2.00 hr60 s3 axial bone · axial · 0.47mm/px · z∈[-575,-519]mm · 4 of 87 slices shown]
[im 9/87  bone]
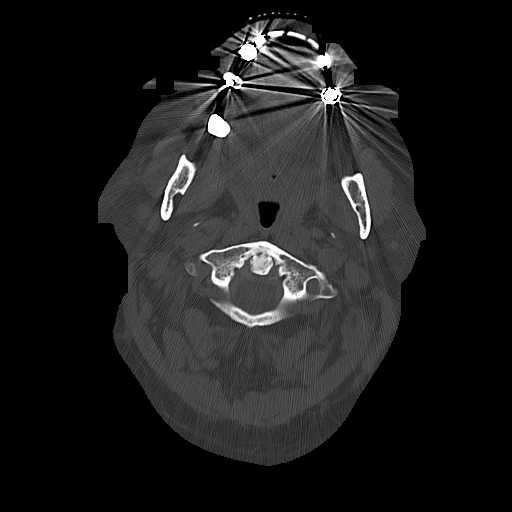
[im 17/87  bone]
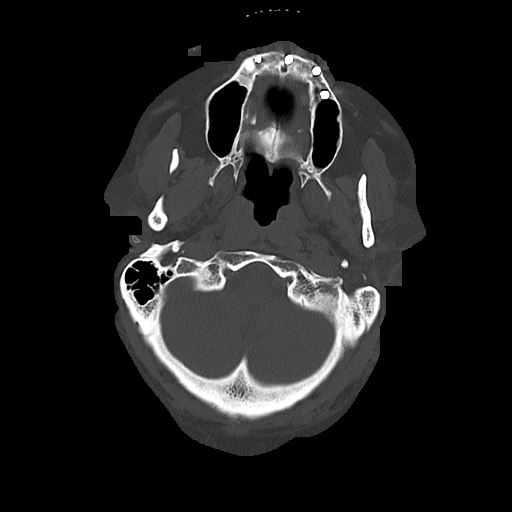
[im 29/87  bone]
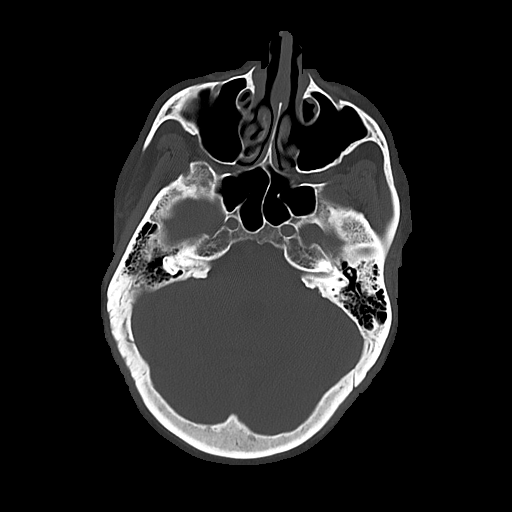
[im 37/87  bone]
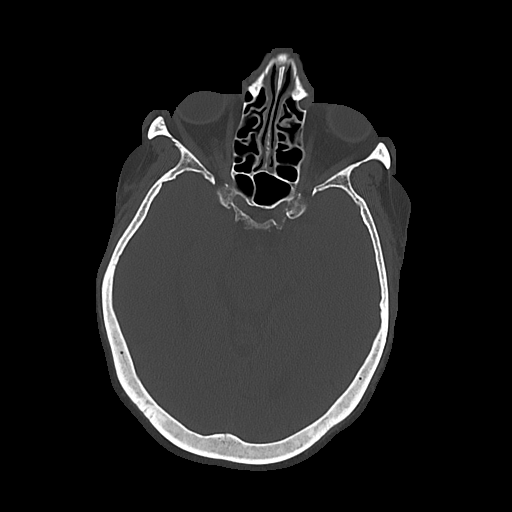

[Series 4: head 3.00 hr40 s3 sag · sagittal · 0.34mm/px · 3 of 92 slices shown]
[im 31/92  brain]
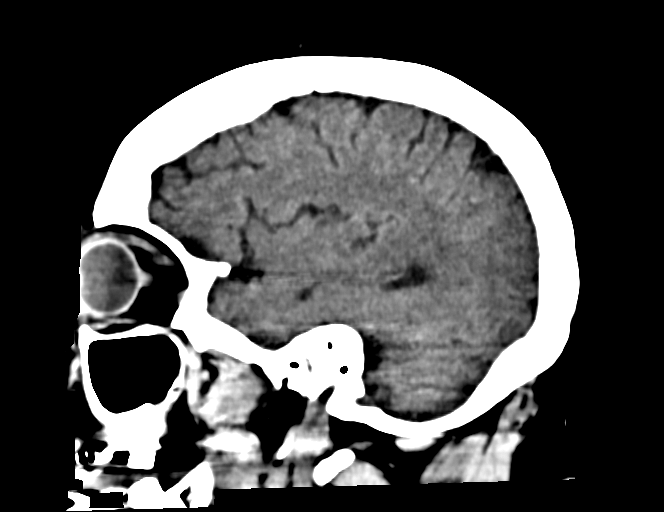
[im 46/92  brain]
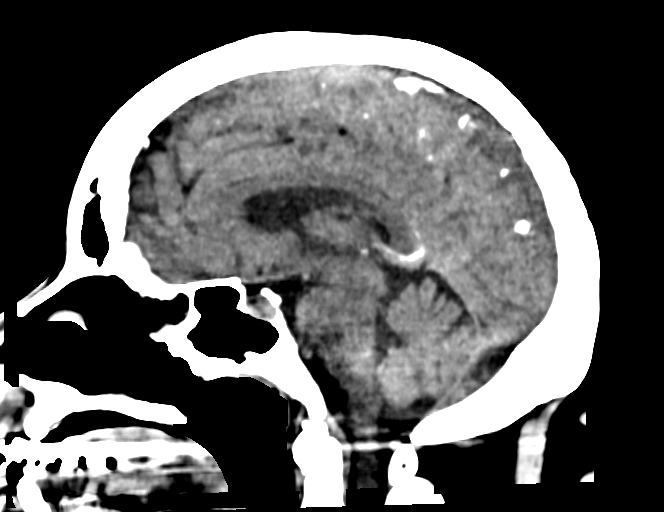
[im 61/92  brain]
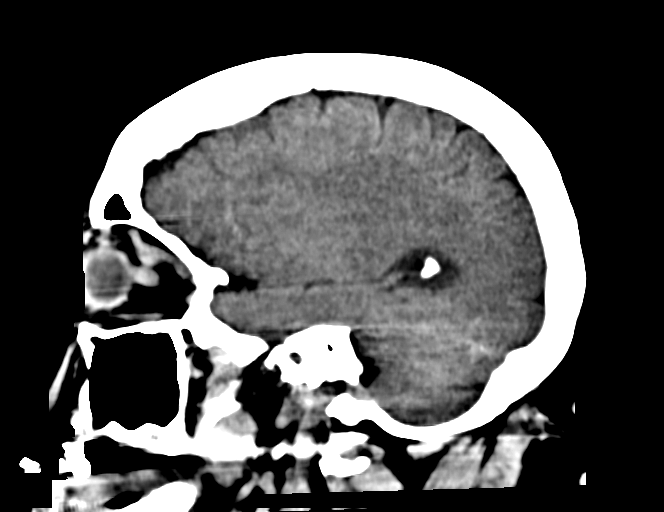

[Series 6: head 3.00 hr40 s3 cor · coronal · 0.34mm/px · 3 of 112 slices shown]
[im 38/112  brain]
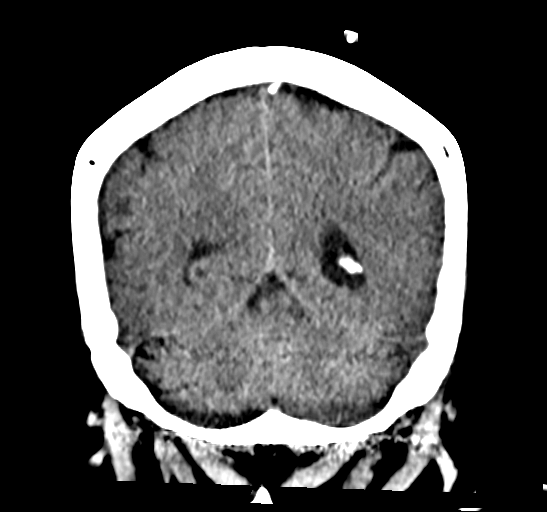
[im 50/112  brain]
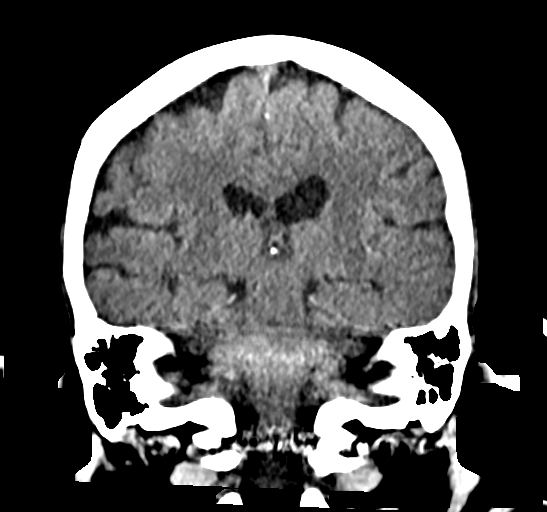
[im 62/112  brain]
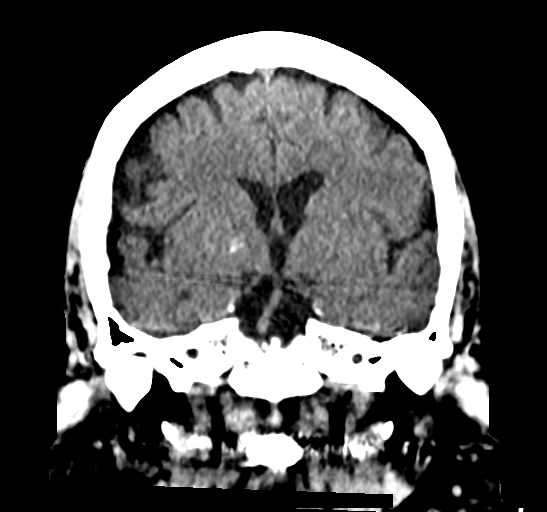

[16 of 47 positions shown; findings below may reference images not displayed]

FINDINGS: CT HEAD FINDINGS

Brain: Mild age related volume loss. Mild chronic small-vessel
ischemic change of the basal ganglia and hemispheric white matter.
No sign of recent infarction, mass lesion, hemorrhage, hydrocephalus
or extra-axial collection.

Vascular: There is atherosclerotic calcification of the major
vessels at the base of the brain.

Skull: Negative

Sinuses/Orbits: Clear/normal

Other: None

CT CERVICAL SPINE FINDINGS

Alignment: Normal

Skull base and vertebrae: No traumatic finding.

Soft tissues and spinal canal: Negative

Disc levels: Foramen magnum is widely patent. Ordinary arthritis at
the C1-2 articulation which could relate to osteoarthritis or CPPD
arthritis. No encroachment upon the neural structures.

From C2-3 through C7-T1, there is a remarkably normal appearance of
the spine for a person of this age. No sign of degenerative disc
disease or degenerative facet disease. No canal or foraminal
stenosis.

Upper chest: Negative

Other: None
IMPRESSION: Head CT: No acute or traumatic finding. Minimal small vessel change
of the hemispheric white matter and basal ganglia regions.

Cervical spine CT: No acute or traumatic finding. Ordinary arthritis
at the C1-2 articulation. The remainder of the cervical spine has a
remarkably normal appearance for a person of this age.

## 2020-02-25 NOTE — Progress Notes (Signed)
PATIENT: Amy [name] DOB: October 31, 1945  REASON FOR VISIT: follow up HISTORY FROM: patient  Chief Complaint  Patient presents with  . Follow-up    cpap fu, rm 6, pt states she feels she can't breathe well with cpap     HISTORY OF PRESENT ILLNESS: Today 02/25/20 Amy [name] is a 74 y.o. female here today for follow up for OSA on CPAP.  She continues to work on compliance.  She has not noted significant benefit of using CPAP therapy last couple of months but recognizes the need to continue trying.  She does understand the risk of untreated sleep apnea and wants to continue with therapy.  She denies any difficulty with her mask or headgear.  She does continue to have concerns about her machine not registering time used.  She has not reached out to her DME company.  She continues to wake up in the middle of the night and takes off her mask.  Compliance report dated 01/25/2020 through 02/23/2020 reveals that she used CPAP 17 of the past 30 days for compliance of 57%.  She used CPAP greater than 4 hours 15 of the last 30 days for compliance of 50%.  Average usage was 6 hours and 48 minutes on days used.  Residual AHI was 2.5 on 5 to 15 cm of water and an EPR of 3.  There was no significant leak noted.  HISTORY: (copied from my note on 11/25/2019)  Amy [name] is a 74 y.o. female here today for follow up for mild OSA with REM dependent apnea (AHI 26/hr). She admits that she is having difficulty with compliance.  There are nights where she falls asleep and forgets to place her mask.  She also reports that there are nights that her CPAP is not record and accurate time that she has used this machine.  She has not reached out to her DME company with this concern.  Otherwise she is doing well.  She feels that the mask fits well.  Compliance report dated 10/26/2019 11/25/2019 reveals that she used CPAP 16 of the last 30 days for compliance of 53%.  She used CPAP greater  than 4 hours 12 of the last 30 days for compliance of 40%.  Average usage on days used was 6 hours and 45 minutes.  Residual AHI was 8.7 on 5 to 15 cm of water and an EPR of 3.  There was no significant leak noted.  HISTORY: (copied from my note on 05/28/2019)  Amy Amy Lyles-Williamsis a 74 y.o.femalehere today for follow up of OSA on CPAP.She reports that she is adjusting to CPAP therapy. Unfortunately, she has recently lost her baby brother. She has been more depressed and is grieving.She has tried to be as consistent with use as possible. Compliance report dated 04/27/2019 through 05/26/2019 reveals that she is used CPAP 23 of the last 30 days for compliance of 77%. 23 days she used CPAP greater than 4 hours for compliance of 77%. Average usage was 7 hours and 13 minutes. AHI was 2.3 on 5 to 15 cm of water and EPR of 3. There was no significant leak. She improvement in daytime sleepiness nursing there.  HISTORY: (copied fromDr Dohmeier'snote on 01/22/2019)  [id number] Amy Lyles-Williamsis a 74 y.o.female, seen here as in a referral from Dr. Jaynee Eagles. Chief complaint according to patient :Untreated OSA-  Patient has had a PSG in lab at the [facility] location, about 12 years ago and was diagnosed with  OSA but could not tolerate the prescribed CPAP at the time: " It almost killed me " She returned the device after 2-3 weeks. She felt suffocated.   Sleepandmedical history: small bowel obstruction, PVD, GERD, Neuropathy, painful- feet and legs. Asthma , DDD, headaches, glaucoma,Iron def. anemia.   Family sleep history:parents were snorers, especially father, none of her siblings were diagnosed with OSA. 6 brothers and 2 sisters. HTN is prevalent. Several have CAD an heart murmurs.  DM in both sides of the family. Mother had cervical cancer.  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 lives with husband and his cousin. .  Was trying to smoke while in college- quit at age  22.no ETOH,  Coffee 1-2 cups a day, drinks iced tea or soda when eating out- now now.  Sleep habits are as follows:dinner time is 5 -7 PM, 8-9 PM is bedtime. She watches TV and waits to fall asleep. I try to stay awake to watch my favorite at 12 midnight", then I cannot sleep.  Sleeps on left side, and 3-4 pillows. Bedroom is neither cool, nor quiet nor dark. Her husband has moved to another bedroom. Stays asleep for 1-3 hours, and has nocturia around 3 AM and 5 AM. Rises at 10 AM. Total Sleep time has become 5 hours or less. Each bathroom break leads to a long struggle to go back to sleep. Woken some night by headaches, sharper, throbbing , they come and go more quickly.  Many mornings has a headache of dull, constant , not throbbing.  Sleep has shifted into the morning time over many years. Since 2004- date of retirement.  She used to take a mid day nap- 2 hours!   REVIEW OF SYSTEMS: Out of a complete 14 system review of symptoms, the patient complains only of the following symptoms, chronic pain, fatigue and all other reviewed systems are negative.  ESS: 10  ALLERGIES: Allergies  Allergen Reactions  . Latex Other (See Comments)    Sores, blisters - reaction to gloves  . Crestor [Rosuvastatin Calcium]     Myalgias  . Penicillins Other (See Comments)    UNSPECIFIED REACTION  "Has taken amoxicillin and ampicillin with no reaction" Has patient had a PCN reaction causing immediate rash, facial/tongue/throat swelling, SOB or lightheadedness with hypotension: Unknown Has patient had a PCN reaction causing severe rash involving mucus membranes or skin necrosis: Unknown Has patient had a PCN reaction that required hospitalization: Unknown Has patient had a PCN reaction occurring within the last 10 years: No   . Tetanus Toxoids Rash  . Tomato Rash    HOME MEDICATIONS: Outpatient Medications Prior to Visit  Medication Sig Dispense Refill  . acetaminophen (TYLENOL) 500 MG tablet Take  1,000 mg by mouth every 6 (six) hours as needed for moderate pain.     Marland Kitchen azelaic acid (AZELEX) 20 % cream Apply 1 application topically 2 (two) times daily. After skin is thoroughly washed and patted dry, gently but thoroughly massage a thin film of azelaic acid cream into the affected area twice daily, in the morning and evening.    . B Complex Vitamins (B COMPLEX PO) Take by mouth.    . calcium-vitamin D (OSCAL WITH D) 500-200 MG-UNIT tablet Take 1 tablet by mouth daily with breakfast.    . cloNIDine (CATAPRES) 0.1 MG tablet Take 1 tablet (0.1 mg total) by mouth 3 (three) times daily. 60 tablet 11  . diclofenac sodium (VOLTAREN) 1 % GEL Apply 4 g topically 4 (four) times daily. 100  g 0  . diphenhydrAMINE (BENADRYL) 25 MG tablet Take 25 mg by mouth 2 (two) times daily as needed for allergies.    Marland Kitchen estradiol (ESTRACE) 0.1 MG/GM vaginal cream Place 1 Applicatorful vaginally 2 (two) times a week. (Patient taking differently: Place 1 Applicatorful vaginally 2 (two) times a week. Tuesday and Thursday) 42.5 g 12  . Fluoxetine HCl, PMDD, 10 MG TABS Take by mouth.    . fluticasone (FLONASE) 50 MCG/ACT nasal spray Place 1 spray into both nostrils daily.  2  . furosemide (LASIX) 20 MG tablet Take 20 mg by mouth daily as needed.    . gabapentin (NEURONTIN) 600 MG tablet Take 600 mg by mouth 2 (two) times daily.    . hydrALAZINE (APRESOLINE) 50 MG tablet Take 50 mg by mouth 3 (three) times daily.    . hydrochlorothiazide (HYDRODIURIL) 12.5 MG tablet Take 12.5 mg by mouth daily.    Marland Kitchen losartan (COZAAR) 100 MG tablet TK 1 T PO ONCE D    . meloxicam (MOBIC) 15 MG tablet Take 15 mg by mouth daily.    . Menthol-Methyl Salicylate (MUSCLE RUB EX) Apply 1 application topically 2 (two) times daily as needed (pain in neck and back).     . methocarbamol (ROBAXIN) 500 MG tablet Take 500 mg by mouth 2 (two) times daily as needed for muscle spasms.    . metoprolol (LOPRESSOR) 50 MG tablet Take 1 tablet (50 mg total) by  mouth 2 (two) times daily.    Marland Kitchen NIFEdipine (PROCARDIA) 10 MG capsule Take 10 mg by mouth 3 (three) times daily.    . ondansetron (ZOFRAN-ODT) 4 MG disintegrating tablet DIS 1 T ON THE TONGUE TID PRN    . OVER THE COUNTER MEDICATION Take 2 tablets by mouth daily. Swiss Kriss OTC herbal laxative    . pantoprazole (PROTONIX) 40 MG tablet Take 80 mg by mouth daily.   0  . PARoxetine (PAXIL) 30 MG tablet Take 30 mg by mouth daily.    . potassium chloride SA (K-DUR,KLOR-CON) 20 MEQ tablet Take 20 mEq by mouth daily.    . predniSONE (STERAPRED UNI-PAK 21 TAB) 10 MG (21) TBPK tablet Take as directed 21 tablet 0  . tapentadol (NUCYNTA) 50 MG tablet Take 50 mg by mouth every 6 (six) hours as needed for moderate pain.    Marland Kitchen triamcinolone ointment (KENALOG) 0.5 % Apply 1 application topically 2 (two) times daily. To rash to feet 30 g 0  . vitamin B-12 (CYANOCOBALAMIN) 1000 MCG tablet Take 1,000 mcg by mouth daily.    . vitamin E (VITAMIN E) 400 UNIT capsule Take 400 Units by mouth daily.     No facility-administered medications prior to visit.    PAST MEDICAL HISTORY: Past Medical History:  Diagnosis Date  . Abdominal pain of unknown etiology 02/05/2016  . Anxiety   . Arthritis    ddd- RA  . Asthma    "sleeping asthma"  . Chronic back pain    lumbar steroid injection's  . Complication of anesthesia    Hard to wake up. Pt sts is took 3 days.  . Constipation   . Depression   . Dysphonia    intermittent "voice changes"  . Esophageal dysmotility   . Fibromyalgia   . Fibromyalgia   . GERD (gastroesophageal reflux disease)   . Glaucoma   . Headache   . High cholesterol    ? pt states her doctor told her to continue eating vegetables & fruit  .  History of DVT (deep vein thrombosis) 1989   LEFT UPPER ARM  . History of hiatal hernia   . History of MRSA infection 2011  . Hypertension   . Irregular heart rate    "years ago"  . Low iron   . Lumbago   . Mild obstructive sleep apnea    per  study 02-07-2006 - no cpap  . Neuropathy    feet  . Pelvic pain in female   . Peripheral vascular disease ([place])    "poor circulation"  . SBO (small bowel obstruction) ([place]) 06/2016  . Seasonal allergies   . Short of breath on exertion   . Urinary frequency   . Vaginal atrophy   . Weakness    both hands and feet    PAST SURGICAL HISTORY: Past Surgical History:  Procedure Laterality Date  . abdominal adhesions removed    . ABDOMINAL HYSTERECTOMY  1978  . BACK SURGERY    . BUNIONECTOMY Left 2008  . CATARACT EXTRACTION Bilateral 2017  . COLON SURGERY    . COLONOSCOPY    . CYSTO WITH HYDRODISTENSION N/A 07/12/2015   Procedure: CYSTOSCOPY/HYDRODISTENSION;  Surgeon: Bjorn Loser, MD;  Location[facility];  Service: Urology;  Laterality: N/A;  . DILATION AND CURETTAGE OF UTERUS    . ESOPHAGEAL MANOMETRY N/A 11/22/2014   Procedure: ESOPHAGEAL MANOMETRY (EM);  Surgeon: Winfield Cunas., MD;  Location: [facility];  Service: Endoscopy;  Laterality: N/A;  . ESOPHAGOGASTRODUODENOSCOPY N/A 07/15/2013   Procedure: ESOPHAGOGASTRODUODENOSCOPY (EGD);  Surgeon: Winfield Cunas., MD;  Location: [facility];  Service: Endoscopy;  Laterality: N/A;  need xray  . EXPLORATORY LAPAROTOMY    . EYE SURGERY Bilateral    cataract surgery with lens implants  . HARDWARE REMOVAL N/A 01/02/2016   Procedure: Exploration of Lumbar Fusion,Removal of hardware Lumbar One-Two ;Redo Posterior Lumbar Fusion Lumbar One-Two;  Surgeon: Kary Kos, MD;  Location: [facility];  Service: Neurosurgery;  Laterality: N/A;  . ingrown toenail removal    . LAPAROSCOPIC CHOLECYSTECTOMY  01-11-2006  . LAPAROSCOPY N/A 07/06/2016   Procedure: LAPAROSCOPY DIAGNOSTIC EMERGENT TO OPEN;  Surgeon: Greer Pickerel, MD;  Location: [facility];  Service: General;  Laterality: N/A;  . LAPAROTOMY N/A 07/06/2016   Procedure: EXPLORATORY LAPAROTOMY LYSIS OF ADHESIONS FOR 3 HOURS;  Surgeon: Greer Pickerel, MD;  Location: [facility];   Service: General;  Laterality: N/A;  . lipoma removal    . LUMBAR FUSION  2014   L4 -- L5  . MASS EXCISION Left 02/20/2013   Procedure: EXCISION LEFT BUTTOCK  MASS;  Surgeon: Adin Hector, MD;  Location: [facility];  Service: General;  Laterality: Left;  . NOSE SURGERY  2007  . REMOVAL HARDWARE L4-L5/  BILATERAL LAMINECTOMY L2 - L5 AND FUSION  06-12-2011  . SHOULDER OPEN ROTATOR CUFF REPAIR Left 05/05/2014   Procedure: OPEN ACROMIONECTOMY AND OPEN REPAIR OF ROTATOR CUFF, TISSUEMEND GRAFT WITH ANCHOR ;  Surgeon: Tobi Bastos, MD;  Location: [facility];  Service: Orthopedics;  Laterality: Left;  . SPINE SURGERY     x6  . TONSILLECTOMY      FAMILY HISTORY: Family History  Problem Relation Age of Onset  . Hypertension Mother   . Cancer Mother        cervix  . Kidney disease Mother   . Osteoarthritis Mother   . Diabetes Mother   . Hypertension Sister   . Cancer Sister        polyps  .  Kidney disease Brother   . Cancer Daughter        leukemia  . Hypertension Brother   . Liver cancer Brother   . Hypertension Father   . Colon cancer Father   . Colon polyps Father   . Hypertension Brother   . Liver cancer Brother   . Aneurysm Maternal Grandmother   . Migraines Neg Hx   . Headache Neg Hx     SOCIAL HISTORY: Social History   Socioeconomic History  . Marital status: Married    Spouse name: Not on file  . Number of children: 1  . Years of education: Not on file  . Highest education level: Master's degree (e.g., MA, MS, MEng, MEd, MSW, MBA)  Occupational History  . Not on file  Tobacco Use  . Smoking status: Former Smoker    Quit date: 07/10/1969    Years since quitting: 50.6  . Smokeless tobacco: Never Used  Substance and Sexual Activity  . Alcohol use: Not Currently    Comment: rare, social  . Drug use: No  . Sexual activity: Not on file  Other Topics Concern  . Not on file  Social History Narrative   Lives at home with husband and his cousin   Right handed    Caffeine: max 2 cups coffee per day   Social Determinants of Health   Financial Resource Strain:   . Difficulty of Paying Living Expenses:   Food Insecurity:   . Worried About Charity fundraiser in the Last Year:   . Arboriculturist in the Last Year:   Transportation Needs:   . Film/video editor (Medical):   Marland Kitchen Lack of Transportation (Non-Medical):   Physical Activity:   . Days of Exercise per Week:   . Minutes of Exercise per Session:   Stress:   . Feeling of Stress :   Social Connections:   . Frequency of Communication with Friends and Family:   . Frequency of Social Gatherings with Friends and Family:   . Attends Religious Services:   . Active Member of Clubs or Organizations:   . Attends Archivist Meetings:   Marland Kitchen Marital Status:   Intimate Partner Violence:   . Fear of Current or Ex-Partner:   . Emotionally Abused:   Marland Kitchen Physically Abused:   . Sexually Abused:       PHYSICAL EXAM  Vitals:   02/25/20 0922  BP: (!) 159/79  Pulse: 86  Weight: 295 lb (133.8 kg)  Height: 5\' 5"  (1.651 m)   Body mass index is 49.09 kg/m.  Generalized: Well developed, in no acute distress  Cardiology: normal rate and rhythm, no murmur noted Respiratory: clear to auscultation bilaterally  Neurological examination  Mentation: Alert oriented to time, place, history taking. Follows all commands speech and language fluent Cranial nerve II-XII: Pupils were equal round reactive to light. Extraocular movements were full, visual field were full  Motor: The motor testing reveals 5 over 5 strength of bilateral upper extremities. 4/5 bilateral lower extremities.  Good symmetric motor tone is noted throughout.  Gait and station: arthritic gait.  Walks with a cane.  Unable to tandem  DIAGNOSTIC DATA (LABS, IMAGING, TESTING) - I reviewed patient records, labs, notes, testing and imaging myself where available.  No flowsheet data found.   Lab Results  Component Value Date   WBC  6.6 03/30/2019   HGB 13.7 03/30/2019   HCT 42.5 03/30/2019   MCV 93.4 03/30/2019   PLT  287 03/30/2019      Component Value Date/Time   NA 136 03/30/2019 1832   NA 137 09/14/2016 0000   K 4.3 03/30/2019 1832   CL 102 03/30/2019 1832   CO2 26 03/30/2019 1832   GLUCOSE 86 03/30/2019 1832   BUN 13 03/30/2019 1832   BUN 13 09/14/2016 0000   CREATININE 1.14 (H) 03/30/2019 1832   CALCIUM 8.9 03/30/2019 1832   PROT 7.2 04/05/2018 1342   ALBUMIN 3.4 (L) 04/05/2018 1342   AST 20 04/05/2018 1342   ALT 15 04/05/2018 1342   ALKPHOS 133 (H) 04/05/2018 1342   BILITOT 0.3 04/05/2018 1342   GFRNONAA 48 (L) 03/30/2019 1832   GFRAA 55 (L) 03/30/2019 1832   Lab Results  Component Value Date   CHOL 165 07/03/2016   HDL 82 07/03/2016   LDLCALC 75 07/03/2016   TRIG 148 07/27/2016   CHOLHDL 2.0 07/03/2016   No results found for: HGBA1C Lab Results  Component Value Date   VITAMINB12 2,661 (H) 06/27/2016   Lab Results  Component Value Date   TSH 2.825 07/07/2016     ASSESSMENT AND PLAN 74 y.o. year old female  has a past medical history of Abdominal pain of unknown etiology (02/05/2016), Anxiety, Arthritis, Asthma, Chronic back pain, Complication of anesthesia, Constipation, Depression, Dysphonia, Esophageal dysmotility, Fibromyalgia, Fibromyalgia, GERD (gastroesophageal reflux disease), Glaucoma, Headache, High cholesterol, History of DVT (deep vein thrombosis) (1989), History of hiatal hernia, History of MRSA infection (2011), Hypertension, Irregular heart rate, Low iron, Lumbago, Mild obstructive sleep apnea, Neuropathy, Pelvic pain in female, Peripheral vascular disease ([place]), SBO (small bowel obstruction) ([place]) (06/2016), Seasonal allergies, Short of breath on exertion, Urinary frequency, Vaginal atrophy, and Weakness. here with     ICD-10-CM   1. OSA on CPAP  G47.33    Z99.89   2. Morbid obesity ([place])  E66.01     Nyelli continues to work on CPAP compliance.  She does recognize the  need for continued therapy and risk associated with untreated sleep apnea.  I have encouraged her to continue working on compliance.  She was advised to use CPAP every night for a minimum of 4 hours each night.  Discussed considering valerian root for sleep aid.  I have advised her of the potential side effects of this medication.  Additional information provided in AVS.  She may try this over-the-counter if interested.  We have discussed working on healthy lifestyle habits.  Well-balanced diet and regular exercise encouraged.  She will follow-up with me in 3 months, sooner if needed.  She verbalizes understanding and agreement with this plan.   No orders of the defined types were placed in this encounter.    No orders of the defined types were placed in this encounter.     I spent 15 minutes with the patient. 50% of this time was spent counseling and educating patient on plan of care and medications.    Amy Presto, FNP-C 02/25/2020, 10:08 AM [facility] [address], [place], [place] [address] [phone]

## 2020-02-25 NOTE — Patient Instructions (Addendum)
Please focus on nightly use and meeting 4 hour usage goal every night.   Consider valerian root over the counter for help with sleep and relaxation.   Stay well hydrated. Well balanced diet and try to be as active as possible.  Follow up with me in 3 months   Please continue using your CPAP regularly. While your insurance requires that you use CPAP at least 4 hours each night on 70% of the nights, I recommend, that you not skip any nights and use it throughout the night if you can. Getting used to CPAP and staying with the treatment long term does take time and patience and discipline. Untreated obstructive sleep apnea when it is moderate to severe can have an adverse impact on cardiovascular health and raise her risk for heart disease, arrhythmias, hypertension, congestive heart failure, stroke and diabetes. Untreated obstructive sleep apnea causes sleep disruption, nonrestorative sleep, and sleep deprivation. This can have an impact on your day to day functioning and cause daytime sleepiness and impairment of cognitive function, memory loss, mood disturbance, and problems focussing. Using CPAP regularly can improve these symptoms.  Sleep Apnea Sleep apnea affects breathing during sleep. It causes breathing to stop for a short time or to become shallow. It can also increase the risk of:  Heart attack.  Stroke.  Being very overweight (obese).  Diabetes.  Heart failure.  Irregular heartbeat. The goal of treatment is to help you breathe normally again. What are the causes? There are three kinds of sleep apnea:  Obstructive sleep apnea. This is caused by a blocked or collapsed airway.  Central sleep apnea. This happens when the brain does not send the right signals to the muscles that control breathing.  Mixed sleep apnea. This is a combination of obstructive and central sleep apnea. The most common cause of this condition is a collapsed or blocked airway. This can happen if:  Your  throat muscles are too relaxed.  Your tongue and tonsils are too large.  You are overweight.  Your airway is too small. What increases the risk?  Being overweight.  Smoking.  Having a small airway.  Being older.  Being female.  Drinking alcohol.  Taking medicines to calm yourself (sedatives or tranquilizers).  Having family members with the condition. What are the signs or symptoms?  Trouble staying asleep.  Being sleepy or tired during the day.  Getting angry a lot.  Loud snoring.  Headaches in the morning.  Not being able to focus your mind (concentrate).  Forgetting things.  Less interest in sex.  Mood swings.  Personality changes.  Feelings of sadness (depression).  Waking up a lot during the night to pee (urinate).  Dry mouth.  Sore throat. How is this diagnosed?  Your medical history.  A physical exam.  A test that is done when you are sleeping (sleep study). The test is most often done in a sleep lab but may also be done at home. How is this treated?   Sleeping on your side.  Using a medicine to get rid of mucus in your nose (decongestant).  Avoiding the use of alcohol, medicines to help you relax, or certain pain medicines (narcotics).  Losing weight, if needed.  Changing your diet.  Not smoking.  Using a machine to open your airway while you sleep, such as: ? An oral appliance. This is a mouthpiece that shifts your lower jaw forward. ? A CPAP device. This device blows air through a mask when you  breathe out (exhale). ? An EPAP device. This has valves that you put in each nostril. ? A BPAP device. This device blows air through a mask when you breathe in (inhale) and breathe out.  Having surgery if other treatments do not work. It is important to get treatment for sleep apnea. Without treatment, it can lead to:  High blood pressure.  Coronary artery disease.  In men, not being able to have an erection (impotence).  Reduced  thinking ability. Follow these instructions at home: Lifestyle  Make changes that your doctor recommends.  Eat a healthy diet.  Lose weight if needed.  Avoid alcohol, medicines to help you relax, and some pain medicines.  Do not use any products that contain nicotine or tobacco, such as cigarettes, e-cigarettes, and chewing tobacco. If you need help quitting, ask your doctor. General instructions  Take over-the-counter and prescription medicines only as told by your doctor.  If you were given a machine to use while you sleep, use it only as told by your doctor.  If you are having surgery, make sure to tell your doctor you have sleep apnea. You may need to bring your device with you.  Keep all follow-up visits as told by your doctor. This is important. Contact a doctor if:  The machine that you were given to use during sleep bothers you or does not seem to be working.  You do not get better.  You get worse. Get help right away if:  Your chest hurts.  You have trouble breathing in enough air.  You have an uncomfortable feeling in your back, arms, or stomach.  You have trouble talking.  One side of your body feels weak.  A part of your face is hanging down. These symptoms may be an emergency. Do not wait to see if the symptoms will go away. Get medical help right away. Call your local emergency services (911 in the [place] Do not drive yourself to the hospital. Summary  This condition affects breathing during sleep.  The most common cause is a collapsed or blocked airway.  The goal of treatment is to help you breathe normally while you sleep. This information is not intended to replace advice given to you by your health care provider. Make sure you discuss any questions you have with your health care provider. Document Revised: 06/27/2018 Document Reviewed: 05/06/2018   CPAP and BPAP Information CPAP and BPAP are methods of helping a person breathe with the use of  air pressure. CPAP stands for "continuous positive airway pressure." BPAP stands for "bi-level positive airway pressure." In both methods, air is blown through your nose or mouth and into your air passages to help you breathe well. CPAP and BPAP use different amounts of pressure to blow air. With CPAP, the amount of pressure stays the same while you breathe in and out. With BPAP, the amount of pressure is increased when you breathe in (inhale) so that you can take larger breaths. Your health care provider will recommend whether CPAP or BPAP would be more helpful for you. Why are CPAP and BPAP treatments used? CPAP or BPAP can be helpful if you have:  Sleep apnea.  Chronic obstructive pulmonary disease (COPD).  Heart failure.  Medical conditions that weaken the muscles of the chest including muscular dystrophy, or neurological diseases such as amyotrophic lateral sclerosis (ALS).  Other problems that cause breathing to be weak, abnormal, or difficult. CPAP is most commonly used for obstructive sleep apnea (OSA) to  keep the airways from collapsing when the muscles relax during sleep. How is CPAP or BPAP administered? Both CPAP and BPAP are provided by a small machine with a flexible plastic tube that attaches to a plastic mask. You wear the mask. Air is blown through the mask into your nose or mouth. The amount of pressure that is used to blow the air can be adjusted on the machine. Your health care provider will determine the pressure setting that should be used based on your individual needs. When should CPAP or BPAP be used? In most cases, the mask only needs to be worn during sleep. Generally, the mask needs to be worn throughout the night and during any daytime naps. People with certain medical conditions may also need to wear the mask at other times when they are awake. Follow instructions from your health care provider about when to use the machine. What are some tips for using the  mask?   Because the mask needs to be snug, some people feel trapped or closed-in (claustrophobic) when first using the mask. If you feel this way, you may need to get used to the mask. One way to do this is by holding the mask loosely over your nose or mouth and then gradually applying the mask more snugly. You can also gradually increase the amount of time that you use the mask.  Masks are available in various types and sizes. Some fit over your mouth and nose while others fit over just your nose. If your mask does not fit well, talk with your health care provider about getting a different one.  If you are using a mask that fits over your nose and you tend to breathe through your mouth, a chin strap may be applied to help keep your mouth closed.  The CPAP and BPAP machines have alarms that may sound if the mask comes off or develops a leak.  If you have trouble with the mask, it is very important that you talk with your health care provider about finding a way to make the mask easier to tolerate. Do not stop using the mask. Stopping the use of the mask could have a negative impact on your health. What are some tips for using the machine?  Place your CPAP or BPAP machine on a secure table or stand near an electrical outlet.  Know where the on/off switch is located on the machine.  Follow instructions from your health care provider about how to set the pressure on your machine and when you should use it.  Do not eat or drink while the CPAP or BPAP machine is on. Food or fluids could get pushed into your lungs by the pressure of the CPAP or BPAP.  Do not smoke. Tobacco smoke residue can damage the machine.  For home use, CPAP and BPAP machines can be rented or purchased through home health care companies. Many different brands of machines are available. Renting a machine before purchasing may help you find out which particular machine works well for you.  Keep the CPAP or BPAP machine and  attachments clean. Ask your health care provider for specific instructions. Get help right away if:  You have redness or open areas around your nose or mouth where the mask fits.  You have trouble using the CPAP or BPAP machine.  You cannot tolerate wearing the CPAP or BPAP mask.  You have pain, discomfort, and bloating in your abdomen. Summary  CPAP and BPAP are methods  of helping a person breathe with the use of air pressure.  Both CPAP and BPAP are provided by a small machine with a flexible plastic tube that attaches to a plastic mask.  If you have trouble with the mask, it is very important that you talk with your health care provider about finding a way to make the mask easier to tolerate. This information is not intended to replace advice given to you by your health care provider. Make sure you discuss any questions you have with your health care provider. Document Revised: 12/31/2018 Document Reviewed: 07/30/2016 Elsevier Patient Education  2020 [employer] Patient Education  2020 [place], [place] officinalis oral dosage forms What is this medicine? VALERIAN (vuh LEER ee uhn) is an herbal or dietary supplement. It is promoted to help relaxation, sleep and stress. The FDA has not approved this supplement for any medical use. This supplement may be used for other purposes; ask your health care provider or pharmacist if you have questions. This medicine may be used for other purposes; ask your health care provider or pharmacist if you have questions. What should I tell my health care provider before I take this medicine? They need to know if you have any of these conditions:  drug abuse or addiction  emotional illness like anxiety, depression  heart disease  kidney disease  liver disease  if you frequently drink alcohol containing drinks  sleeping problems  an unusual or allergic reaction to valerian, herbs, plants, other medicines,  foods, dyes, or preservatives  pregnant or trying to get pregnant  breast-feeding How should I use this medicine? Take this supplement by mouth with a glass of water. Follow the directions on the package labeling, or take as directed by your health care professional. If this supplement upsets your stomach, take it with food. Do not take this supplement more often than directed. Contact your pediatrician regarding the use of this supplement in children. Special care may be needed. Overdosage: If you think you have taken too much of this medicine contact a poison control center or emergency room at once. NOTE: This medicine is only for you. Do not share this medicine with others. What if I miss a dose? If you miss a dose, take it as soon as you can. If it is almost time for your next dose, take only that dose. Do not take double or extra doses. What may interact with this medicine? Check with your doctor or healthcare professional if you are taking any of the following medications:  alcohol  barbiturate medicines for sleep or seizures  medicines for depression, anxiety, or psychotic disturbances  medicines for sleep  muscle relaxants  narcotic pain medicines This list may not describe all possible interactions. Give your health care provider a list of all the medicines, herbs, non-prescription drugs, or dietary supplements you use. Also tell them if you smoke, drink alcohol, or use illegal drugs. Some items may interact with your medicine. What should I watch for while using this medicine? See your doctor if your symptoms do not get better or if they get worse. You may get drowsy or dizzy. Do not drive, use machinery, or do anything that needs mental alertness until you know how this medicine affects you. Do not stand or sit up quickly, especially if you are an older patient. This reduces the risk of dizzy or fainting spells. Alcohol may interfere with the effect of this medicine. If you  are scheduled  for any medical or dental procedure, tell your healthcare provider that you are taking this supplement. You may need to stop taking this supplement before the procedure. Herbal or dietary supplements are not regulated like medicines. Rigid quality control standards are not required for dietary supplements. The purity and strength of these products can vary. The safety and effect of this dietary supplement for a certain disease or illness is not well known. This product is not intended to diagnose, treat, cure or prevent any disease. The Food and Drug Administration suggests the following to help consumers protect themselves:  Always read product labels and follow directions.  Natural does not mean a product is safe for humans to take.  Look for products that include USP after the ingredient name. This means that the manufacturer followed the standards of the [place] Pharmacopoeia.  Supplements made or sold by a nationally known food or drug company are more likely to be made under tight controls. You can write to the company for more information about how the product was made. What side effects may I notice from receiving this medicine? Side effects that you should report to your doctor or health care professional as soon as possible:  allergic reactions like skin rash, itching or hives, swelling of the face, lips, or tongue  breathing problems  changes in emotions or moods, like depressed mood  confused, forgetful  dark urine  fast, irregular heartbeat  problems with balance, talking, walking  unusually weak or tired  yellowing of the eyes, skin Side effects that usually do not require medical attention (report to your doctor or health care professional if they continue or are bothersome):  stomach upset  dizziness  tiredness This list may not describe all possible side effects. Call your doctor for medical advice about side effects. You may report side effects to FDA  at 1-800-FDA-1088. Where should I keep my medicine? Keep out of the reach of children. Store at room temperature or as directed on the package label. Protect from moisture. Throw away any unused supplement after the expiration date. NOTE: This sheet is a summary. It may not cover all possible information. If you have questions about this medicine, talk to your doctor, pharmacist, or health care provider.  2020 Elsevier/Gold Standard (2008-06-07 17:33:52)

## 2020-03-03 DIAGNOSIS — M5441 Lumbago with sciatica, right side: Secondary | ICD-10-CM | POA: Diagnosis not present

## 2020-03-03 DIAGNOSIS — K219 Gastro-esophageal reflux disease without esophagitis: Secondary | ICD-10-CM | POA: Diagnosis not present

## 2020-03-03 DIAGNOSIS — R490 Dysphonia: Secondary | ICD-10-CM | POA: Diagnosis not present

## 2020-03-03 DIAGNOSIS — M48061 Spinal stenosis, lumbar region without neurogenic claudication: Secondary | ICD-10-CM | POA: Diagnosis not present

## 2020-03-03 DIAGNOSIS — K224 Dyskinesia of esophagus: Secondary | ICD-10-CM | POA: Diagnosis not present

## 2020-03-03 DIAGNOSIS — F339 Major depressive disorder, recurrent, unspecified: Secondary | ICD-10-CM | POA: Diagnosis not present

## 2020-03-03 DIAGNOSIS — M1990 Unspecified osteoarthritis, unspecified site: Secondary | ICD-10-CM | POA: Diagnosis not present

## 2020-03-03 DIAGNOSIS — Z1389 Encounter for screening for other disorder: Secondary | ICD-10-CM | POA: Diagnosis not present

## 2020-03-03 DIAGNOSIS — E7800 Pure hypercholesterolemia, unspecified: Secondary | ICD-10-CM | POA: Diagnosis not present

## 2020-03-03 DIAGNOSIS — Z0000 Encounter for general adult medical examination without abnormal findings: Secondary | ICD-10-CM | POA: Diagnosis not present

## 2020-03-03 DIAGNOSIS — I10 Essential (primary) hypertension: Secondary | ICD-10-CM | POA: Diagnosis not present

## 2020-03-03 DIAGNOSIS — H6122 Impacted cerumen, left ear: Secondary | ICD-10-CM | POA: Diagnosis not present

## 2020-03-07 ENCOUNTER — Other Ambulatory Visit: Payer: Self-pay | Admitting: Family Medicine

## 2020-03-07 DIAGNOSIS — M8580 Other specified disorders of bone density and structure, unspecified site: Secondary | ICD-10-CM

## 2020-03-08 DIAGNOSIS — M48062 Spinal stenosis, lumbar region with neurogenic claudication: Secondary | ICD-10-CM | POA: Diagnosis not present

## 2020-03-08 DIAGNOSIS — M533 Sacrococcygeal disorders, not elsewhere classified: Secondary | ICD-10-CM | POA: Diagnosis not present

## 2020-03-08 DIAGNOSIS — Z6825 Body mass index (BMI) 25.0-25.9, adult: Secondary | ICD-10-CM | POA: Diagnosis not present

## 2020-03-08 DIAGNOSIS — I10 Essential (primary) hypertension: Secondary | ICD-10-CM | POA: Diagnosis not present

## 2020-03-10 ENCOUNTER — Other Ambulatory Visit: Payer: Self-pay | Admitting: Sports Medicine

## 2020-04-07 DIAGNOSIS — K228 Other specified diseases of esophagus: Secondary | ICD-10-CM | POA: Diagnosis not present

## 2020-04-07 DIAGNOSIS — K2930 Chronic superficial gastritis without bleeding: Secondary | ICD-10-CM | POA: Diagnosis not present

## 2020-04-07 DIAGNOSIS — D123 Benign neoplasm of transverse colon: Secondary | ICD-10-CM | POA: Diagnosis not present

## 2020-04-07 DIAGNOSIS — Z800 Family history of malignant neoplasm of digestive organs: Secondary | ICD-10-CM | POA: Diagnosis not present

## 2020-04-07 DIAGNOSIS — R1310 Dysphagia, unspecified: Secondary | ICD-10-CM | POA: Diagnosis not present

## 2020-04-07 DIAGNOSIS — K317 Polyp of stomach and duodenum: Secondary | ICD-10-CM | POA: Diagnosis not present

## 2020-04-07 DIAGNOSIS — D3A092 Benign carcinoid tumor of the stomach: Secondary | ICD-10-CM | POA: Diagnosis not present

## 2020-04-07 DIAGNOSIS — K3189 Other diseases of stomach and duodenum: Secondary | ICD-10-CM | POA: Diagnosis not present

## 2020-04-07 DIAGNOSIS — Z1211 Encounter for screening for malignant neoplasm of colon: Secondary | ICD-10-CM | POA: Diagnosis not present

## 2020-04-11 ENCOUNTER — Other Ambulatory Visit: Payer: Self-pay | Admitting: Family Medicine

## 2020-04-11 DIAGNOSIS — Z1231 Encounter for screening mammogram for malignant neoplasm of breast: Secondary | ICD-10-CM

## 2020-04-14 DIAGNOSIS — K228 Other specified diseases of esophagus: Secondary | ICD-10-CM | POA: Diagnosis not present

## 2020-04-14 DIAGNOSIS — D123 Benign neoplasm of transverse colon: Secondary | ICD-10-CM | POA: Diagnosis not present

## 2020-04-14 DIAGNOSIS — D3A092 Benign carcinoid tumor of the stomach: Secondary | ICD-10-CM | POA: Diagnosis not present

## 2020-04-14 DIAGNOSIS — K2930 Chronic superficial gastritis without bleeding: Secondary | ICD-10-CM | POA: Diagnosis not present

## 2020-04-19 ENCOUNTER — Ambulatory Visit
Admission: RE | Admit: 2020-04-19 | Discharge: 2020-04-19 | Disposition: A | Discharge status: Home / Self Care | Payer: Medicare Other | Source: Ambulatory Visit | Attending: Family Medicine | Admitting: Family Medicine

## 2020-04-19 ENCOUNTER — Other Ambulatory Visit: Payer: Self-pay

## 2020-04-19 DIAGNOSIS — Z1231 Encounter for screening mammogram for malignant neoplasm of breast: Secondary | ICD-10-CM | POA: Diagnosis not present

## 2020-05-05 DIAGNOSIS — M533 Sacrococcygeal disorders, not elsewhere classified: Secondary | ICD-10-CM | POA: Diagnosis not present

## 2020-05-11 DIAGNOSIS — M533 Sacrococcygeal disorders, not elsewhere classified: Secondary | ICD-10-CM | POA: Diagnosis not present

## 2020-05-12 DIAGNOSIS — M48062 Spinal stenosis, lumbar region with neurogenic claudication: Secondary | ICD-10-CM | POA: Diagnosis not present

## 2020-05-12 DIAGNOSIS — M961 Postlaminectomy syndrome, not elsewhere classified: Secondary | ICD-10-CM | POA: Diagnosis not present

## 2020-05-19 DIAGNOSIS — K2940 Chronic atrophic gastritis without bleeding: Secondary | ICD-10-CM | POA: Diagnosis not present

## 2020-05-19 DIAGNOSIS — K317 Polyp of stomach and duodenum: Secondary | ICD-10-CM | POA: Diagnosis not present

## 2020-05-19 DIAGNOSIS — K3189 Other diseases of stomach and duodenum: Secondary | ICD-10-CM | POA: Diagnosis not present

## 2020-05-23 ENCOUNTER — Other Ambulatory Visit: Payer: Self-pay

## 2020-05-23 ENCOUNTER — Ambulatory Visit
Admission: RE | Admit: 2020-05-23 | Discharge: 2020-05-23 | Disposition: A | Discharge status: Home / Self Care | Payer: Medicare Other | Source: Ambulatory Visit | Attending: Family Medicine | Admitting: Family Medicine

## 2020-05-23 DIAGNOSIS — M8580 Other specified disorders of bone density and structure, unspecified site: Secondary | ICD-10-CM

## 2020-05-23 DIAGNOSIS — Z780 Asymptomatic menopausal state: Secondary | ICD-10-CM | POA: Diagnosis not present

## 2020-05-25 DIAGNOSIS — K317 Polyp of stomach and duodenum: Secondary | ICD-10-CM | POA: Diagnosis not present

## 2020-05-25 DIAGNOSIS — K2940 Chronic atrophic gastritis without bleeding: Secondary | ICD-10-CM | POA: Diagnosis not present

## 2020-05-31 ENCOUNTER — Ambulatory Visit: Payer: Medicare Other | Admitting: Family Medicine

## 2020-05-31 ENCOUNTER — Telehealth: Payer: Self-pay | Admitting: Family Medicine

## 2020-05-31 ENCOUNTER — Encounter: Payer: Self-pay | Admitting: Family Medicine

## 2020-05-31 NOTE — Telephone Encounter (Signed)
Pt called and I provided her with number to [place].

## 2020-05-31 NOTE — Telephone Encounter (Signed)
Called pt back. She is needing new cpap supplies. She tried reaching out to [employer] but had no luck. She started having covid-19 sx and she is waiting to hear back from PCP about getting tested. She thought she had appt tomorrow but I advised she actually had appt today at 9am with AL,NP. I got her rescheduled to 06/07/20 at 11:30am with AL,NP. I advised that if she tests positive for covid-19 or is still having sx next week, we can convert appt to a VV with mychart. I placed her on hold to see what number to provide her for [employer]. I spoke with KD,RN. She advised I should send [employer] a message to see what we need to provide for new supplies. I took pt off hold and she was not on other line. I called back and LVM relaying above info. I sent community message to [place].

## 2020-05-31 NOTE — Progress Notes (Deleted)
PATIENT: Amy [name]: 09-05-1946  REASON FOR VISIT: follow up HISTORY FROM: patient  No chief complaint on file.    HISTORY OF PRESENT ILLNESS: Today 05/31/20 Amy [name] is a 74 y.o. female here today for follow up for OSA on CPAP.   Compliance report dated 04/19/2020 through 05/18/2020 reveals that she used CPAP 15 of the past 30 days for compliance of 50%.  She used CPAP 13 of the past 30 days for greater than 4 hours, compliance 43%.  Average usage on days used was 7 hours and 11 minutes.  Residual AHI was 3.4 on 5 to 15 cm of water and an EPR of 3.  There was no significant leak noted.  HISTORY: (copied from my note on 02/25/2020)  Amy [name] is a 74 y.o. female here today for follow up for OSA on CPAP.  She continues to work on compliance.  She has not noted significant benefit of using CPAP therapy last couple of months but recognizes the need to continue trying.  She does understand the risk of untreated sleep apnea and wants to continue with therapy.  She denies any difficulty with her mask or headgear.  She does continue to have concerns about her machine not registering time used.  She has not reached out to her DME company.  She continues to wake up in the middle of the night and takes off her mask.  Compliance report dated 01/25/2020 through 02/23/2020 reveals that she used CPAP 17 of the past 30 days for compliance of 57%.  She used CPAP greater than 4 hours 15 of the last 30 days for compliance of 50%.  Average usage was 6 hours and 48 minutes on days used.  Residual AHI was 2.5 on 5 to 15 cm of water and an EPR of 3.  There was no significant leak noted.  HISTORY: (copied from my note on 11/25/2019)  Amy [name] a 74 y.o.femalehere today for follow up for mild OSA with REM dependent apnea (AHI 26/hr).She admits that she is having difficulty with compliance.There are nights where she falls asleep and  forgets to place her mask. She also reports that there are nights that her CPAP is not record and accurate time that she has used this machine. She has not reached out to her DME company with this concern. Otherwise she is doing well. She feels that the mask fits well.  Compliance report dated 10/26/2019 11/25/2019 reveals that she used CPAP 16 of the last 30 days for compliance of 53%. She used CPAP greater than 4 hours 12 of the last 30 days for compliance of 40%. Average usage on days used was 6 hours and 45 minutes. Residual AHI was 8.7 on 5 to 15 cm of water and an EPR of 3. There was no significant leak noted.  HISTORY: (copied frommynote on 05/28/2019)  Amy [name] a 74 y.o.femalehere today for follow up of OSA on CPAP.She reports that she is adjusting to CPAP therapy. Unfortunately, she has recently lost her baby brother. She has been more depressed and is grieving.She has tried to be as consistent with use as possible. Compliance report dated 04/27/2019 through 05/26/2019 reveals that she is used CPAP 23 of the last 30 days for compliance of 77%. 23 days she used CPAP greater than 4 hours for compliance of 77%. Average usage was 7 hours and 13 minutes. AHI was 2.3 on 5 to 15 cm of water and EPR of  3. There was no significant leak. She improvement in daytime sleepiness nursing there.  HISTORY: (copied fromDr Dohmeier'snote on 01/22/2019)  Amy [name] a 73 y.o.female, seen here as in a referral from Dr. Jaynee Eagles. Chief complaint according to patient :Untreated OSA-  Patient has had a PSG in lab at the [facility] location, about 12 years ago and was diagnosed with OSA but could not tolerate the prescribed CPAP at the time: " It almost killed me " She returned the device after 2-3 weeks. She felt suffocated.   Sleepandmedical history: small bowel obstruction, PVD, GERD, Neuropathy, painful- feet and legs. Asthma , DDD, headaches,  glaucoma,Iron def. anemia.   Family sleep history:parents were snorers, especially father, none of her siblings were diagnosed with OSA. 6 brothers and 2 sisters. HTN is prevalent. Several have CAD an heart murmurs.  DM in both sides of the family. Mother had cervical cancer.  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 lives with husband and his cousin. .  Was trying to smoke while in college- quit at age 9.no ETOH,  Coffee 1-2 cups a day, drinks iced tea or soda when eating out- now now.  Sleep habits are as follows:dinner time is 5 -7 PM, 8-9 PM is bedtime. She watches TV and waits to fall asleep. I try to stay awake to watch my favorite at 12 midnight", then I cannot sleep.  Sleeps on left side, and 3-4 pillows. Bedroom is neither cool, nor quiet nor dark. Her husband has moved to another bedroom. Stays asleep for 1-3 hours, and has nocturia around 3 AM and 5 AM. Rises at 10 AM. Total Sleep time has become 5 hours or less. Each bathroom break leads to a long struggle to go back to sleep. Woken some night by headaches, sharper, throbbing , they come and go more quickly.  Many mornings has a headache of dull, constant , not throbbing.  Sleep has shifted into the morning time over many years. Since 2004- date of retirement.  She used to take a mid day nap- 2 hours!   REVIEW OF SYSTEMS: Out of a complete 14 system review of symptoms, the patient complains only of the following symptoms, and all other reviewed systems are negative.  ALLERGIES: Allergies  Allergen Reactions  . Latex Other (See Comments)    Sores, blisters - reaction to gloves  . Crestor [Rosuvastatin Calcium]     Myalgias  . Penicillins Other (See Comments)    UNSPECIFIED REACTION  "Has taken amoxicillin and ampicillin with no reaction" Has patient had a PCN reaction causing immediate rash, facial/tongue/throat swelling, SOB or lightheadedness with hypotension: Unknown Has patient had a PCN reaction causing severe rash  involving mucus membranes or skin necrosis: Unknown Has patient had a PCN reaction that required hospitalization: Unknown Has patient had a PCN reaction occurring within the last 10 years: No   . Tetanus Toxoids Rash  . Tomato Rash    HOME MEDICATIONS: Outpatient Medications Prior to Visit  Medication Sig Dispense Refill  . acetaminophen (TYLENOL) 500 MG tablet Take 1,000 mg by mouth every 6 (six) hours as needed for moderate pain.     Marland Kitchen azelaic acid (AZELEX) 20 % cream Apply 1 application topically 2 (two) times daily. After skin is thoroughly washed and patted dry, gently but thoroughly massage a thin film of azelaic acid cream into the affected area twice daily, in the morning and evening.    . B Complex Vitamins (B COMPLEX PO) Take by mouth.    Marland Kitchen  calcium-vitamin D (OSCAL WITH D) 500-200 MG-UNIT tablet Take 1 tablet by mouth daily with breakfast.    . cloNIDine (CATAPRES) 0.1 MG tablet Take 1 tablet (0.1 mg total) by mouth 3 (three) times daily. 60 tablet 11  . diclofenac sodium (VOLTAREN) 1 % GEL Apply 4 g topically 4 (four) times daily. 100 g 0  . diphenhydrAMINE (BENADRYL) 25 MG tablet Take 25 mg by mouth 2 (two) times daily as needed for allergies.    Marland Kitchen estradiol (ESTRACE) 0.1 MG/GM vaginal cream Place 1 Applicatorful vaginally 2 (two) times a week. (Patient taking differently: Place 1 Applicatorful vaginally 2 (two) times a week. Tuesday and Thursday) 42.5 g 12  . Fluoxetine HCl, PMDD, 10 MG TABS Take by mouth.    . fluticasone (FLONASE) 50 MCG/ACT nasal spray Place 1 spray into both nostrils daily.  2  . furosemide (LASIX) 20 MG tablet Take 20 mg by mouth daily as needed.    . gabapentin (NEURONTIN) 600 MG tablet Take 600 mg by mouth 2 (two) times daily.    . hydrALAZINE (APRESOLINE) 50 MG tablet Take 50 mg by mouth 3 (three) times daily.    . hydrochlorothiazide (HYDRODIURIL) 12.5 MG tablet Take 12.5 mg by mouth daily.    Marland Kitchen losartan (COZAAR) 100 MG tablet TK 1 T PO ONCE D    .  meloxicam (MOBIC) 15 MG tablet Take 15 mg by mouth daily.    . Menthol-Methyl Salicylate (MUSCLE RUB EX) Apply 1 application topically 2 (two) times daily as needed (pain in neck and back).     . methocarbamol (ROBAXIN) 500 MG tablet Take 500 mg by mouth 2 (two) times daily as needed for muscle spasms.    . metoprolol (LOPRESSOR) 50 MG tablet Take 1 tablet (50 mg total) by mouth 2 (two) times daily.    Marland Kitchen NIFEdipine (PROCARDIA) 10 MG capsule Take 10 mg by mouth 3 (three) times daily.    . ondansetron (ZOFRAN-ODT) 4 MG disintegrating tablet DIS 1 T ON THE TONGUE TID PRN    . OVER THE COUNTER MEDICATION Take 2 tablets by mouth daily. Swiss Kriss OTC herbal laxative    . pantoprazole (PROTONIX) 40 MG tablet Take 80 mg by mouth daily.   0  . PARoxetine (PAXIL) 30 MG tablet Take 30 mg by mouth daily.    . potassium chloride SA (K-DUR,KLOR-CON) 20 MEQ tablet Take 20 mEq by mouth daily.    . predniSONE (STERAPRED UNI-PAK 21 TAB) 10 MG (21) TBPK tablet Take as directed 21 tablet 0  . tapentadol (NUCYNTA) 50 MG tablet Take 50 mg by mouth every 6 (six) hours as needed for moderate pain.    Marland Kitchen triamcinolone ointment (KENALOG) 0.5 % Apply 1 application topically 2 (two) times daily. To rash to feet 30 g 0  . vitamin B-12 (CYANOCOBALAMIN) 1000 MCG tablet Take 1,000 mcg by mouth daily.    . vitamin E (VITAMIN E) 400 UNIT capsule Take 400 Units by mouth daily.     No facility-administered medications prior to visit.    PAST MEDICAL HISTORY: Past Medical History:  Diagnosis Date  . Abdominal pain of unknown etiology 02/05/2016  . Anxiety   . Arthritis    ddd- RA  . Asthma    "sleeping asthma"  . Chronic back pain    lumbar steroid injection's  . Complication of anesthesia    Hard to wake up. Pt sts is took 3 days.  . Constipation   . Depression   .  Dysphonia    intermittent "voice changes"  . Esophageal dysmotility   . Fibromyalgia   . Fibromyalgia   . GERD (gastroesophageal reflux disease)   .  Glaucoma   . Headache   . High cholesterol    ? pt states her doctor told her to continue eating vegetables & fruit  . History of DVT (deep vein thrombosis) 1989   LEFT UPPER ARM  . History of hiatal hernia   . History of MRSA infection 2011  . Hypertension   . Irregular heart rate    "years ago"  . Low iron   . Lumbago   . Mild obstructive sleep apnea    per study 02-07-2006 - no cpap  . Neuropathy    feet  . Pelvic pain in female   . Peripheral vascular disease ([place])    "poor circulation"  . SBO (small bowel obstruction) ([place]) 06/2016  . Seasonal allergies   . Short of breath on exertion   . Urinary frequency   . Vaginal atrophy   . Weakness    both hands and feet    PAST SURGICAL HISTORY: Past Surgical History:  Procedure Laterality Date  . abdominal adhesions removed    . ABDOMINAL HYSTERECTOMY  1978  . BACK SURGERY    . BUNIONECTOMY Left 2008  . CATARACT EXTRACTION Bilateral 2017  . COLON SURGERY    . COLONOSCOPY    . CYSTO WITH HYDRODISTENSION N/A 07/12/2015   Procedure: CYSTOSCOPY/HYDRODISTENSION;  Surgeon: Bjorn Loser, MD;  Location: [facility];  Service: Urology;  Laterality: N/A;  . DILATION AND CURETTAGE OF UTERUS    . ESOPHAGEAL MANOMETRY N/A 11/22/2014   Procedure: ESOPHAGEAL MANOMETRY (EM);  Surgeon: Winfield Cunas., MD;  Location: [facility];  Service: Endoscopy;  Laterality: N/A;  . ESOPHAGOGASTRODUODENOSCOPY N/A 07/15/2013   Procedure: ESOPHAGOGASTRODUODENOSCOPY (EGD);  Surgeon: Winfield Cunas., MD;  Location: [facility];  Service: Endoscopy;  Laterality: N/A;  need xray  . EXPLORATORY LAPAROTOMY    . EYE SURGERY Bilateral    cataract surgery with lens implants  . HARDWARE REMOVAL N/A 01/02/2016   Procedure: Exploration of Lumbar Fusion,Removal of hardware Lumbar One-Two ;Redo Posterior Lumbar Fusion Lumbar One-Two;  Surgeon: Kary Kos, MD;  Location: [facility];  Service: Neurosurgery;  Laterality: N/A;  . ingrown  toenail removal    . LAPAROSCOPIC CHOLECYSTECTOMY  01-11-2006  . LAPAROSCOPY N/A 07/06/2016   Procedure: LAPAROSCOPY DIAGNOSTIC EMERGENT TO OPEN;  Surgeon: Greer Pickerel, MD;  Location: [facility];  Service: General;  Laterality: N/A;  . LAPAROTOMY N/A 07/06/2016   Procedure: EXPLORATORY LAPAROTOMY LYSIS OF ADHESIONS FOR 3 HOURS;  Surgeon: Greer Pickerel, MD;  Location: [facility];  Service: General;  Laterality: N/A;  . lipoma removal    . LUMBAR FUSION  2014   L4 -- L5  . MASS EXCISION Left 02/20/2013   Procedure: EXCISION LEFT BUTTOCK  MASS;  Surgeon: Adin Hector, MD;  Location: [facility];  Service: General;  Laterality: Left;  . NOSE SURGERY  2007  . REMOVAL HARDWARE L4-L5/  BILATERAL LAMINECTOMY L2 - L5 AND FUSION  06-12-2011  . SHOULDER OPEN ROTATOR CUFF REPAIR Left 05/05/2014   Procedure: OPEN ACROMIONECTOMY AND OPEN REPAIR OF ROTATOR CUFF, TISSUEMEND GRAFT WITH ANCHOR ;  Surgeon: Tobi Bastos, MD;  Location: [facility];  Service: Orthopedics;  Laterality: Left;  . SPINE SURGERY     x6  . TONSILLECTOMY      FAMILY HISTORY: Family History  Problem Relation Age of Onset  . Hypertension Mother   . Cancer Mother        cervix  . Kidney disease Mother   . Osteoarthritis Mother   . Diabetes Mother   . Hypertension Sister   . Cancer Sister        polyps  . Kidney disease Brother   . Cancer Daughter        leukemia  . Hypertension Brother   . Liver cancer Brother   . Hypertension Father   . Colon cancer Father   . Colon polyps Father   . Hypertension Brother   . Liver cancer Brother   . Aneurysm Maternal Grandmother   . Migraines Neg Hx   . Headache Neg Hx     SOCIAL HISTORY: Social History   Socioeconomic History  . Marital status: Married    Spouse name: Not on file  . Number of children: 1  . Years of education: Not on file  . Highest education level: Master's degree (e.g., MA, MS, MEng, MEd, MSW, MBA)  Occupational History  . Not on file  Tobacco Use  . Smoking status:  Former Smoker    Quit date: 07/10/1969    Years since quitting: 50.9  . Smokeless tobacco: Never Used  Vaping Use  . Vaping Use: Never used  Substance and Sexual Activity  . Alcohol use: Not Currently    Comment: rare, social  . Drug use: No  . Sexual activity: Not on file  Other Topics Concern  . Not on file  Social History Narrative   Lives at home with husband and his cousin   Right handed   Caffeine: max 2 cups coffee per day   Social Determinants of Health   Financial Resource Strain:   . Difficulty of Paying Living Expenses: Not on file  Food Insecurity:   . Worried About Charity fundraiser in the Last Year: Not on file  . Ran Out of Food in the Last Year: Not on file  Transportation Needs:   . Lack of Transportation (Medical): Not on file  . Lack of Transportation (Non-Medical): Not on file  Physical Activity:   . Days of Exercise per Week: Not on file  . Minutes of Exercise per Session: Not on file  Stress:   . Feeling of Stress : Not on file  Social Connections:   . Frequency of Communication with Friends and Family: Not on file  . Frequency of Social Gatherings with Friends and Family: Not on file  . Attends Religious Services: Not on file  . Active Member of Clubs or Organizations: Not on file  . Attends Archivist Meetings: Not on file  . Marital Status: Not on file  Intimate Partner Violence:   . Fear of Current or Ex-Partner: Not on file  . Emotionally Abused: Not on file  . Physically Abused: Not on file  . Sexually Abused: Not on file      PHYSICAL EXAM  There were no vitals filed for this visit. There is no height or weight on file to calculate BMI.  Generalized: Well developed, in no acute distress  Cardiology: normal rate and rhythm, no murmur noted Respiratory: clear to auscultation bilaterally  Neurological examination  Mentation: Alert oriented to time, place, history taking. Follows all commands speech and language  fluent Cranial nerve II-XII: Pupils were equal round reactive to light. Extraocular movements were full, visual field were full  Motor: The motor testing reveals 5  over 5 strength of all 4 extremities. Good symmetric motor tone is noted throughout.  Gait and station: Gait is normal.     DIAGNOSTIC DATA (LABS, IMAGING, TESTING) - I reviewed patient records, labs, notes, testing and imaging myself where available.  No flowsheet data found.   Lab Results  Component Value Date   WBC 6.6 03/30/2019   HGB 13.7 03/30/2019   HCT 42.5 03/30/2019   MCV 93.4 03/30/2019   PLT 287 03/30/2019      Component Value Date/Time   NA 136 03/30/2019 1832   NA 137 09/14/2016 0000   K 4.3 03/30/2019 1832   CL 102 03/30/2019 1832   CO2 26 03/30/2019 1832   GLUCOSE 86 03/30/2019 1832   BUN 13 03/30/2019 1832   BUN 13 09/14/2016 0000   CREATININE 1.14 (H) 03/30/2019 1832   CALCIUM 8.9 03/30/2019 1832   PROT 7.2 04/05/2018 1342   ALBUMIN 3.4 (L) 04/05/2018 1342   AST 20 04/05/2018 1342   ALT 15 04/05/2018 1342   ALKPHOS 133 (H) 04/05/2018 1342   BILITOT 0.3 04/05/2018 1342   GFRNONAA 48 (L) 03/30/2019 1832   GFRAA 55 (L) 03/30/2019 1832   Lab Results  Component Value Date   CHOL 165 07/03/2016   HDL 82 07/03/2016   LDLCALC 75 07/03/2016   TRIG 148 07/27/2016   CHOLHDL 2.0 07/03/2016   No results found for: HGBA1C Lab Results  Component Value Date   VITAMINB12 2,661 (H) 06/27/2016   Lab Results  Component Value Date   TSH 2.825 07/07/2016       ASSESSMENT AND PLAN 74 y.o. year old female  has a past medical history of Abdominal pain of unknown etiology (02/05/2016), Anxiety, Arthritis, Asthma, Chronic back pain, Complication of anesthesia, Constipation, Depression, Dysphonia, Esophageal dysmotility, Fibromyalgia, Fibromyalgia, GERD (gastroesophageal reflux disease), Glaucoma, Headache, High cholesterol, History of DVT (deep vein thrombosis) (1989), History of hiatal hernia, History  of MRSA infection (2011), Hypertension, Irregular heart rate, Low iron, Lumbago, Mild obstructive sleep apnea, Neuropathy, Pelvic pain in female, Peripheral vascular disease ([place]), SBO (small bowel obstruction) ([place]) (06/2016), Seasonal allergies, Short of breath on exertion, Urinary frequency, Vaginal atrophy, and Weakness. here with ***    ICD-10-CM   1. OSA on CPAP  G47.33    Z99.89        No orders of the defined types were placed in this encounter.    No orders of the defined types were placed in this encounter.     I spent 15 minutes with the patient. 50% of this time was spent counseling and educating patient on plan of care and medications.    Debbora Presto, FNP-C 05/31/2020, 7:44 AM [facility] [address], [place], [place] [address] [phone]

## 2020-05-31 NOTE — Telephone Encounter (Signed)
Pt called wanting to inform provider that she is out of her Cpap supplies and is needing to order more. Please advise.

## 2020-06-01 NOTE — Telephone Encounter (Signed)
Received the following message back from Vanessa Ralphs ([employer] ([facility] [employer])): "Our records show she just received supplies 03/23/20. We would probably need new notes and a new rx for the upcoming year. Looks like [employer] team was trying to get CMN (certificate of medical necessity)."   Pt has appt on 06/07/20 at 11:30am with AL,NP. Once pt is seen, these updated notes can be forwarded to [employer].

## 2020-06-02 ENCOUNTER — Telehealth: Payer: Self-pay | Admitting: Family Medicine

## 2020-06-02 NOTE — Telephone Encounter (Signed)
..   Pt understands that although there may be some limitations with this type of visit, we will take all precautions to reduce any security or privacy concerns.  Pt understands that this will be treated like an in office visit and we will file with pt's insurance, and there may be a patient responsible charge related to this service. ? ?

## 2020-06-02 NOTE — Telephone Encounter (Signed)
Noted  

## 2020-06-07 ENCOUNTER — Encounter: Payer: Self-pay | Admitting: Family Medicine

## 2020-06-07 ENCOUNTER — Telehealth (INDEPENDENT_AMBULATORY_CARE_PROVIDER_SITE_OTHER): Payer: Medicare Other | Admitting: Family Medicine

## 2020-06-07 DIAGNOSIS — Z9989 Dependence on other enabling machines and devices: Secondary | ICD-10-CM | POA: Diagnosis not present

## 2020-06-07 DIAGNOSIS — G4733 Obstructive sleep apnea (adult) (pediatric): Secondary | ICD-10-CM | POA: Diagnosis not present

## 2020-06-07 NOTE — Progress Notes (Signed)
PATIENT: Amy [name] DOB: 1946/01/16  REASON FOR VISIT: follow up HISTORY FROM: patient  Virtual Visit via Telephone Note  I connected with Dava Vernetta [name] on 06/07/20 at 11:30 AM EDT by telephone and verified that I am speaking with the correct person using two identifiers.   I discussed the limitations, risks, security and privacy concerns of performing an evaluation and management service by telephone and the availability of in person appointments. I also discussed with the patient that there may be a patient responsible charge related to this service. The patient expressed understanding and agreed to proceed.   History of Present Illness:  06/07/20 Amy [name] is a 74 y.o. female here today for follow up for CPAP follow up. She was doing much better but has faced some challenges recently. She lost her brother to [place] recently. She reports that DME has not sent new supplies. She was doing better with compliance but reports stopping therapy around the middle of August. She is motivated to use CPAP as directed. She understands risks of untreated sleep apnea.   Compliance report dated 04/19/2020 through 05/18/2020 reveals that she used CPAP 15 of the past 30 days for compliance of 50%.  She used CPAP greater than 4 hours 13 of the past 30 days for compliance of 43%.  Average usage on days used was 7 hours and 11 minutes.  Residual AHI was 3.4 on 5 to 15 cm of water and an EPR of 3.  There was no significant leak noted.   History (copied from my note on 02/25/2020)  Amy [name] is a 74 y.o. female here today for follow up for OSA on CPAP.  She continues to work on compliance.  She has not noted significant benefit of using CPAP therapy last couple of months but recognizes the need to continue trying.  She does understand the risk of untreated sleep apnea and wants to continue with therapy.  She denies any difficulty with  her mask or headgear.  She does continue to have concerns about her machine not registering time used.  She has not reached out to her DME company.  She continues to wake up in the middle of the night and takes off her mask.  Compliance report dated 01/25/2020 through 02/23/2020 reveals that she used CPAP 17 of the past 30 days for compliance of 57%.  She used CPAP greater than 4 hours 15 of the last 30 days for compliance of 50%.  Average usage was 6 hours and 48 minutes on days used.  Residual AHI was 2.5 on 5 to 15 cm of water and an EPR of 3.  There was no significant leak noted.  HISTORY: (copied from my note on 11/25/2019)  Amy V Lyles-Williamsis a 74 y.o.femalehere today for follow up for mild OSA with REM dependent apnea (AHI 26/hr).She admits that she is having difficulty with compliance.There are nights where she falls asleep and forgets to place her mask. She also reports that there are nights that her CPAP is not record and accurate time that she has used this machine. She has not reached out to her DME company with this concern. Otherwise she is doing well. She feels that the mask fits well.  Compliance report dated 10/26/2019 11/25/2019 reveals that she used CPAP 16 of the last 30 days for compliance of 53%. She used CPAP greater than 4 hours 12 of the last 30 days for compliance of 40%. Average usage on days used was  6 hours and 45 minutes. Residual AHI was 8.7 on 5 to 15 cm of water and an EPR of 3. There was no significant leak noted.  HISTORY: (copied frommynote on 05/28/2019)  Amy V Lyles-Williamsis a 74 y.o.femalehere today for follow up of OSA on CPAP.She reports that she is adjusting to CPAP therapy. Unfortunately, she has recently lost her baby brother. She has been more depressed and is grieving.She has tried to be as consistent with use as possible. Compliance report dated 04/27/2019 through 05/26/2019 reveals that she is used CPAP 23 of the last 30 days  for compliance of 77%. 23 days she used CPAP greater than 4 hours for compliance of 77%. Average usage was 7 hours and 13 minutes. AHI was 2.3 on 5 to 15 cm of water and EPR of 3. There was no significant leak. She improvement in daytime sleepiness nursing there.  HISTORY: (copied fromDr Dohmeier'snote on 01/22/2019)  Amy V Lyles-Williamsis a 73 y.o.female, seen here as in a referral from Dr. Jaynee Eagles. Chief complaint according to patient :Untreated OSA-  Patient has had a PSG in lab at the [facility] location, about 12 years ago and was diagnosed with OSA but could not tolerate the prescribed CPAP at the time: " It almost killed me " She returned the device after 2-3 weeks. She felt suffocated.   Sleepandmedical history: small bowel obstruction, PVD, GERD, Neuropathy, painful- feet and legs. Asthma , DDD, headaches, glaucoma,Iron def. anemia.   Family sleep history:parents were snorers, especially father, none of her siblings were diagnosed with OSA. 6 brothers and 2 sisters. HTN is prevalent. Several have CAD an heart murmurs.  DM in both sides of the family. Mother had cervical cancer.  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 lives with husband and his cousin. .  Was trying to smoke while in college- quit at age 89.no ETOH,  Coffee 1-2 cups a day, drinks iced tea or soda when eating out- now now.  Sleep habits are as follows:dinner time is 5 -7 PM, 8-9 PM is bedtime. She watches TV and waits to fall asleep. I try to stay awake to watch my favorite at 12 midnight", then I cannot sleep.  Sleeps on left side, and 3-4 pillows. Bedroom is neither cool, nor quiet nor dark. Her husband has moved to another bedroom. Stays asleep for 1-3 hours, and has nocturia around 3 AM and 5 AM. Rises at 10 AM. Total Sleep time has become 5 hours or less. Each bathroom break leads to a long struggle to go back to sleep. Woken some night by headaches, sharper, throbbing , they come and  go more quickly.  Many mornings has a headache of dull, constant , not throbbing.  Sleep has shifted into the morning time over many years. Since 2004- date of retirement.  She used to take a mid day nap- 2 hours!   Observations/Objective:  Generalized: Well developed, in no acute distress  Mentation: Alert oriented to time, place, history taking. Follows all commands speech and language fluent   Assessment and Plan:  74 y.o. year old female  has a past medical history of Abdominal pain of unknown etiology (02/05/2016), Anxiety, Arthritis, Asthma, Chronic back pain, Complication of anesthesia, Constipation, Depression, Dysphonia, Esophageal dysmotility, Fibromyalgia, Fibromyalgia, GERD (gastroesophageal reflux disease), Glaucoma, Headache, High cholesterol, History of DVT (deep vein thrombosis) (1989), History of hiatal hernia, History of MRSA infection (2011), Hypertension, Irregular heart rate, Low iron, Lumbago, Mild obstructive sleep apnea, Neuropathy, Pelvic pain in female, Peripheral vascular  disease ([place]), SBO (small bowel obstruction) ([place]) (06/2016), Seasonal allergies, Short of breath on exertion, Urinary frequency, Vaginal atrophy, and Weakness. here with    ICD-10-CM   1. OSA on CPAP  G47.33 For home use only DME continuous positive airway pressure (CPAP)   Z99.89      Rache was doing much better with compliance until the past month. Unfortunately, she lost her brother to [place] and has ran out of her supplies. She is very motivated to continue CPAP therapy. I will send orders today for replacement supplies. I have encouraged her to resume CPAP therapy ASAP with daily usage and minimum of 4 hour usage. I will have her follow up in 3-4 months for evaluation. She verbalizes understanding and agreement with this plan.   Orders Placed This Encounter  Procedures  . For home use only DME continuous positive airway pressure (CPAP)    Supplies    Order Specific Question:   Length of  Need    Answer:   [employer]    Order Specific Question:   Patient has OSA or probable OSA    Answer:   Yes    Order Specific Question:   Is the patient currently using CPAP in the home    Answer:   Yes    Order Specific Question:   Settings    Answer:   Other see comments    Order Specific Question:   CPAP supplies needed    Answer:   Mask, headgear, cushions, filters, heated tubing and water chamber    No orders of the defined types were placed in this encounter.    Follow Up Instructions:  I discussed the assessment and treatment plan with the patient. The patient was provided an opportunity to ask questions and all were answered. The patient agreed with the plan and demonstrated an understanding of the instructions.   The patient was advised to call back or seek an in-person evaluation if the symptoms worsen or if the condition fails to improve as anticipated.  I provided 15 minutes of non-face-to-face time during this encounter. Patient located at her place of residence. Provider in the office.    Debbora Presto, NP

## 2020-06-13 DIAGNOSIS — R8290 Unspecified abnormal findings in urine: Secondary | ICD-10-CM | POA: Diagnosis not present

## 2020-06-13 DIAGNOSIS — R300 Dysuria: Secondary | ICD-10-CM | POA: Diagnosis not present

## 2020-06-21 IMAGING — CT CT L SPINE W/O CM
1 of 8 series · 5 of 14 positions shown, 7 images · non-contrast
Comparison: Lumbar spine CT 07/15/2018

CLINICAL DATA: Lumbar sciatica with mid to lower back pain.
Bilateral leg pain that is worse on the right.

EXAM:
CT THORACIC SPINE WITHOUT CONTRAST; CT LUMBAR SPINE WITHOUT CONTRAST
TECHNIQUE: Multidetector CT images of the thoracic and lumbar spine were
obtained using the standard protocol without intravenous contrast.

[Series 3: l spine soft · axial · 0.31mm/px · z∈[-611,-455]mm · 5 of 79 slices shown, 7 images]
[im 14/79  soft-tissue]
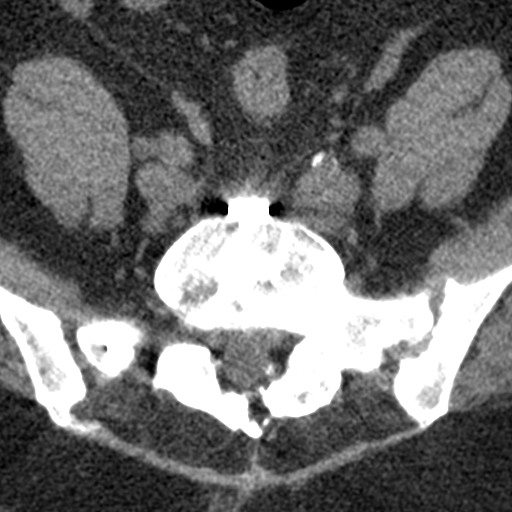
[im 14/79  bone]
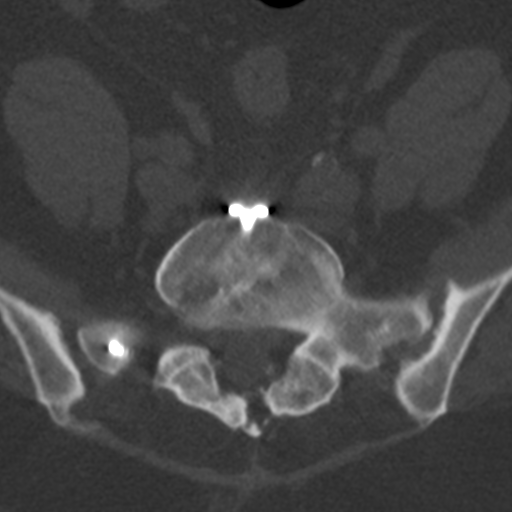
[im 27/79  bone]
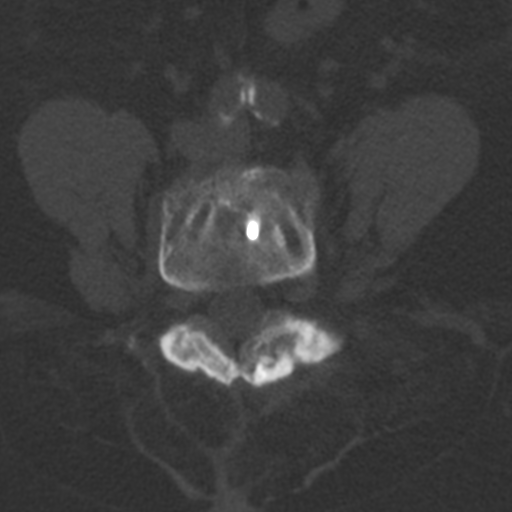
[im 40/79  bone]
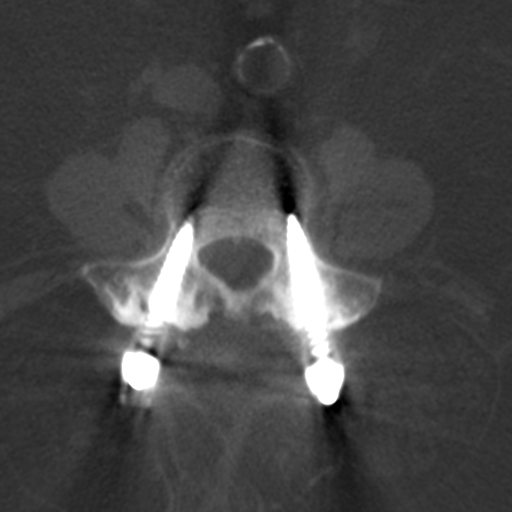
[im 53/79  bone]
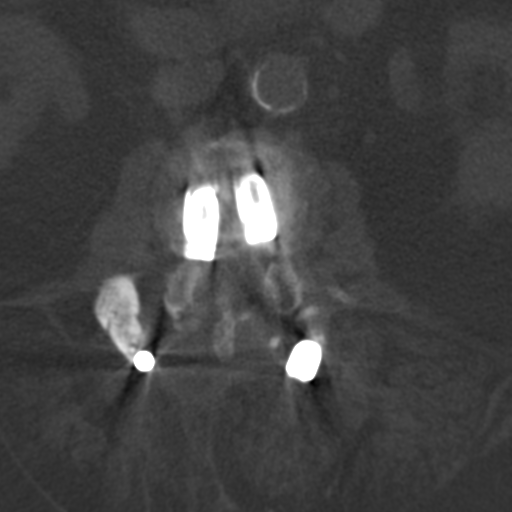
[im 66/79  soft-tissue]
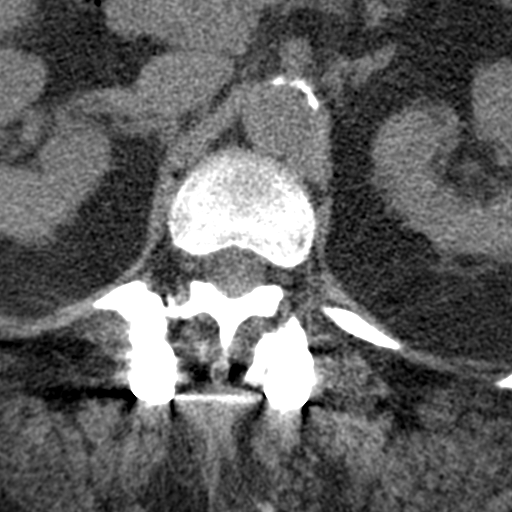
[im 66/79  bone]
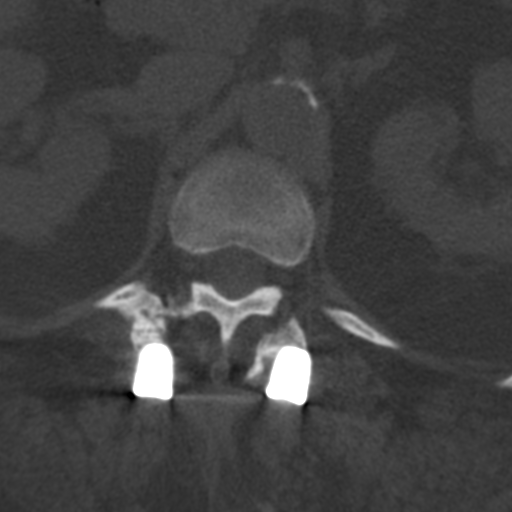

[5 of 14 positions shown; findings below may reference images not displayed]

FINDINGS: Alignment: Exaggerated thoracic kyphosis. Unremarkable lumbar
alignment.

Vertebrae: There is posterior-lateral instrumentation from T10 to L3
with confluent solid appearing arthrodesis. Arthrodesis continues
from L3-S1 with facet screws crossing at L5-S1. Discectomies have
been performed at L1-2, L3-4, L4-5, and L5-S1. The L1-2 cage is
posteriorly eccentric but stable and sclerosis at this level has
resolved. The canal and foramina are patent at this level.

Sacroiliac osteoarthritis with gaping gas-filled joints that are
partially covered.

Negative for fracture or bone lesion

Paraspinal and other soft tissues: Marked fatty atrophy of intrinsic
back muscles at the lower lumbar spine.

Disc levels: Postoperative changes described above. There is disc
narrowing and endplate ridging primarily from T3-4 to T8-9, with
vacuum phenomenon.
IMPRESSION: 1. Solid arthrodesis from T10-S1.
2. Prominent sacroiliac osteoarthritis with gaping gas-filled joints
on both sides.
3. Midthoracic disc degeneration with exaggerated kyphosis. No
adjacent segment degeneration since a 2020 chest CT.

## 2020-06-21 IMAGING — CT CT T SPINE W/O CM
1 series · 12 of 14 positions shown, 15 images · non-contrast
Comparison: Lumbar spine CT 07/15/2018

CLINICAL DATA: Lumbar sciatica with mid to lower back pain.
Bilateral leg pain that is worse on the right.

EXAM:
CT THORACIC SPINE WITHOUT CONTRAST; CT LUMBAR SPINE WITHOUT CONTRAST
TECHNIQUE: Multidetector CT images of the thoracic and lumbar spine were
obtained using the standard protocol without intravenous contrast.

[Series 3: t spine soft · axial · 0.48mm/px · z∈[-453,-168]mm · 12 of 113 slices shown, 15 images]
[im 9/113  soft-tissue]
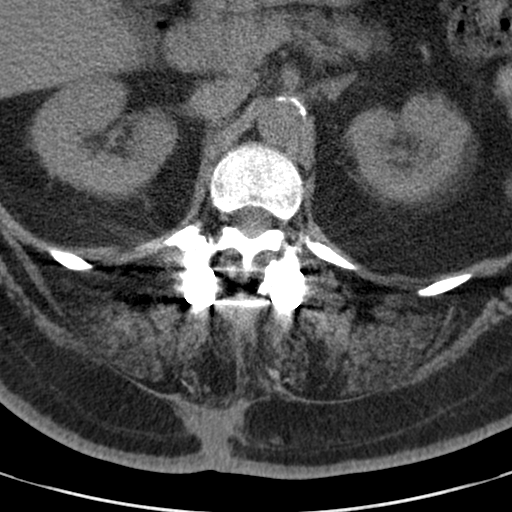
[im 9/113  bone]
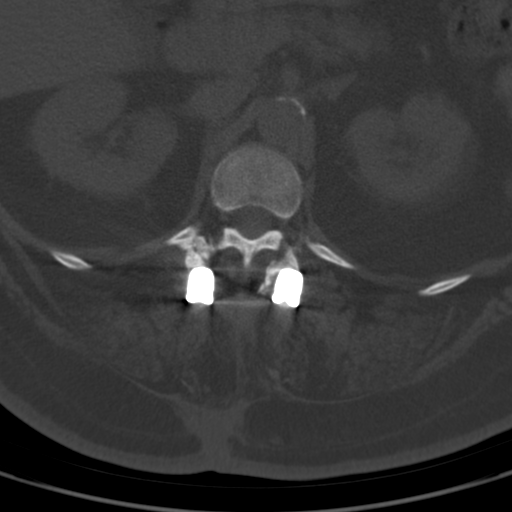
[im 18/113  bone]
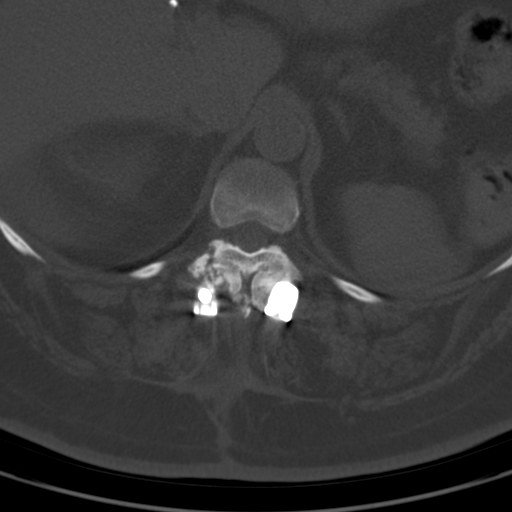
[im 26/113  bone]
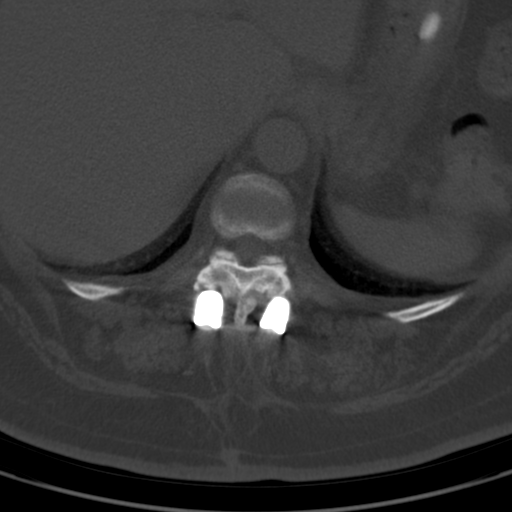
[im 35/113  bone]
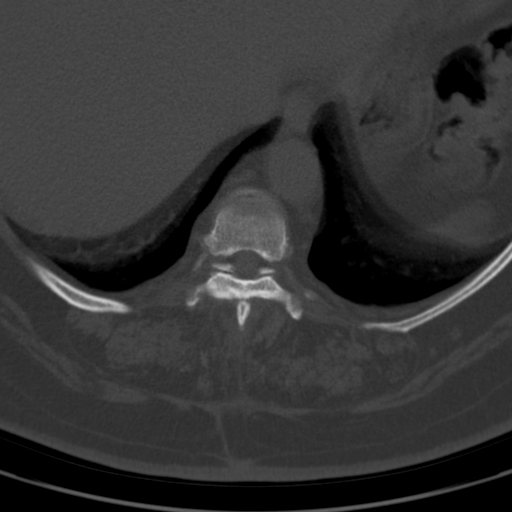
[im 44/113  soft-tissue]
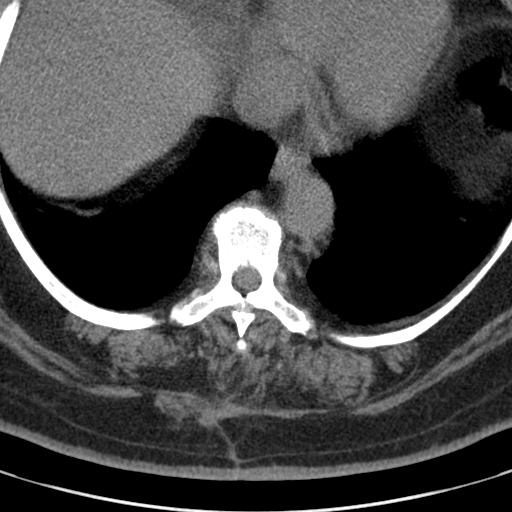
[im 44/113  bone]
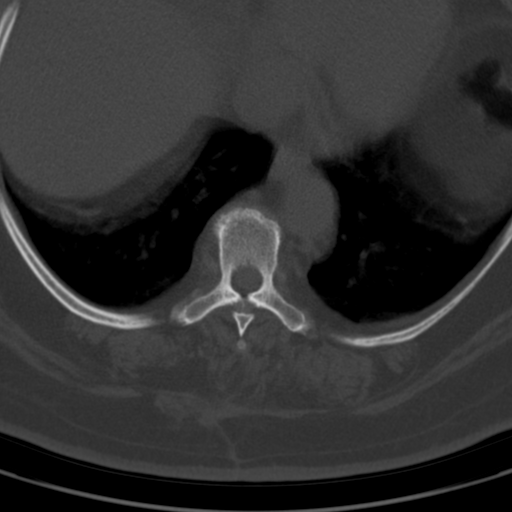
[im 52/113  bone]
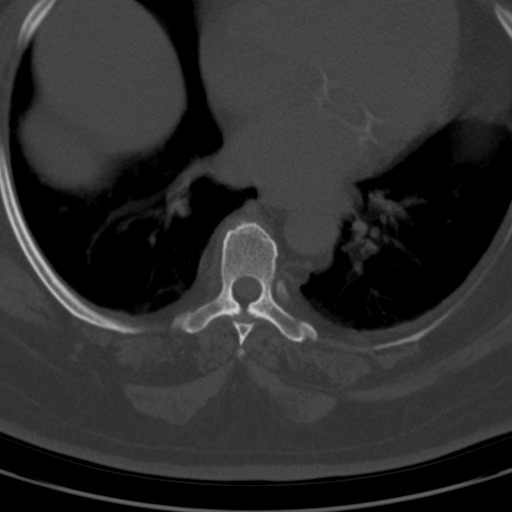
[im 61/113  bone]
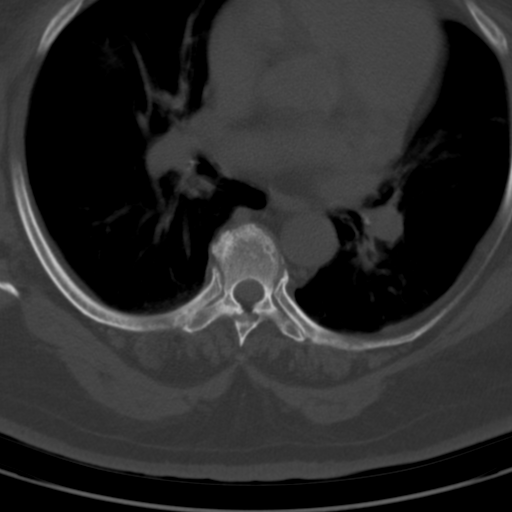
[im 69/113  bone]
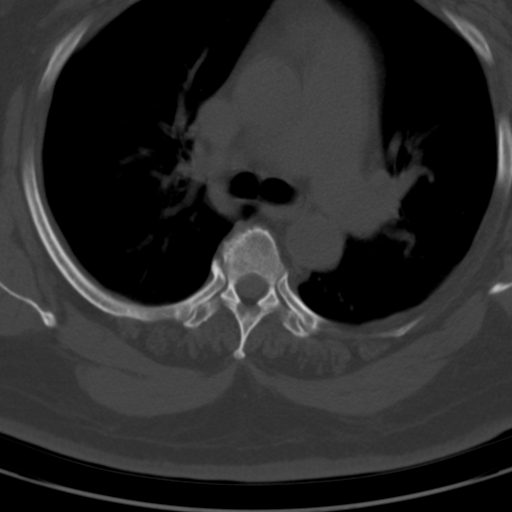
[im 78/113  soft-tissue]
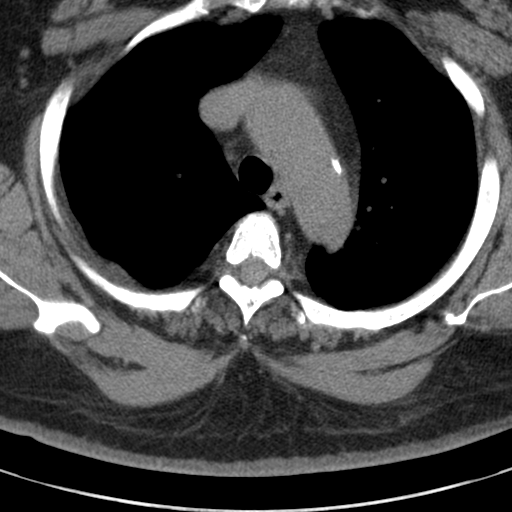
[im 78/113  bone]
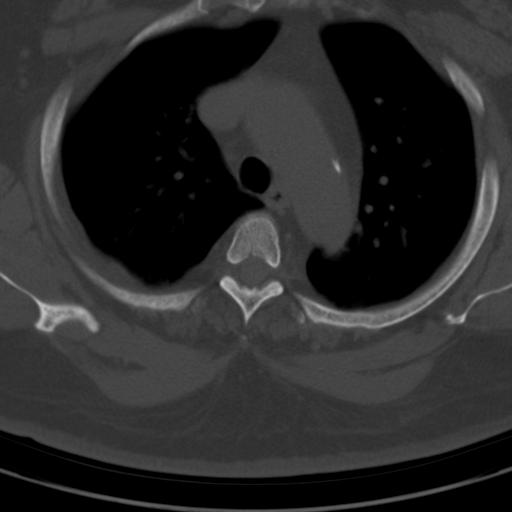
[im 87/113  bone]
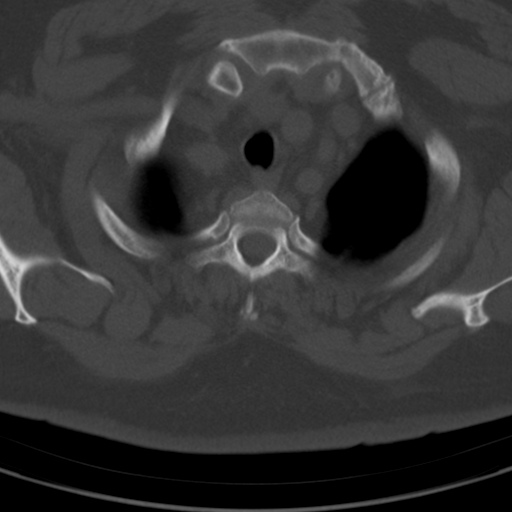
[im 95/113  bone]
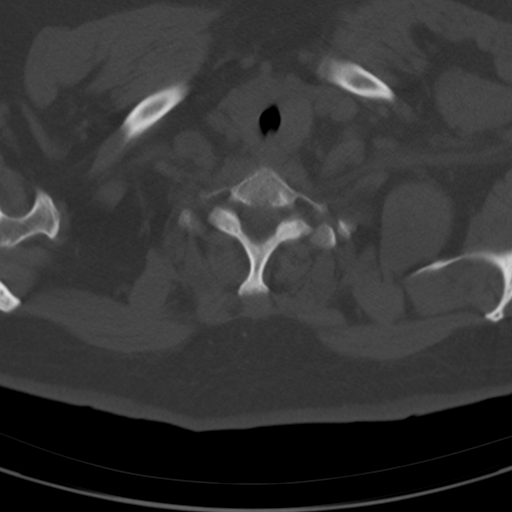
[im 104/113  bone]
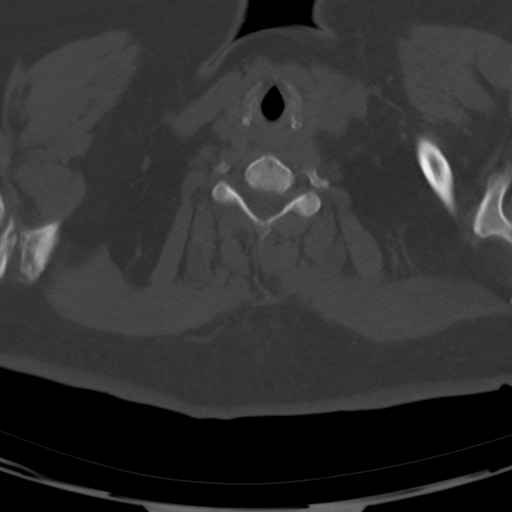

[12 of 14 positions shown; findings below may reference images not displayed]

FINDINGS: Alignment: Exaggerated thoracic kyphosis. Unremarkable lumbar
alignment.

Vertebrae: There is posterior-lateral instrumentation from T10 to L3
with confluent solid appearing arthrodesis. Arthrodesis continues
from L3-S1 with facet screws crossing at L5-S1. Discectomies have
been performed at L1-2, L3-4, L4-5, and L5-S1. The L1-2 cage is
posteriorly eccentric but stable and sclerosis at this level has
resolved. The canal and foramina are patent at this level.

Sacroiliac osteoarthritis with gaping gas-filled joints that are
partially covered.

Negative for fracture or bone lesion

Paraspinal and other soft tissues: Marked fatty atrophy of intrinsic
back muscles at the lower lumbar spine.

Disc levels: Postoperative changes described above. There is disc
narrowing and endplate ridging primarily from T3-4 to T8-9, with
vacuum phenomenon.
IMPRESSION: 1. Solid arthrodesis from T10-S1.
2. Prominent sacroiliac osteoarthritis with gaping gas-filled joints
on both sides.
3. Midthoracic disc degeneration with exaggerated kyphosis. No
adjacent segment degeneration since a 2020 chest CT.

## 2020-07-11 ENCOUNTER — Other Ambulatory Visit: Payer: Self-pay | Admitting: Physician Assistant

## 2020-07-11 ENCOUNTER — Ambulatory Visit
Admission: RE | Admit: 2020-07-11 | Discharge: 2020-07-11 | Disposition: A | Discharge status: Home / Self Care | Payer: Medicare Other | Source: Ambulatory Visit | Attending: Physician Assistant | Admitting: Physician Assistant

## 2020-07-11 DIAGNOSIS — S060X9A Concussion with loss of consciousness of unspecified duration, initial encounter: Secondary | ICD-10-CM | POA: Diagnosis not present

## 2020-07-11 DIAGNOSIS — R079 Chest pain, unspecified: Secondary | ICD-10-CM

## 2020-07-11 DIAGNOSIS — R410 Disorientation, unspecified: Secondary | ICD-10-CM | POA: Diagnosis not present

## 2020-07-11 DIAGNOSIS — W19XXXA Unspecified fall, initial encounter: Secondary | ICD-10-CM

## 2020-07-11 DIAGNOSIS — I700 Atherosclerosis of aorta: Secondary | ICD-10-CM | POA: Diagnosis not present

## 2020-07-11 DIAGNOSIS — R2689 Other abnormalities of gait and mobility: Secondary | ICD-10-CM | POA: Diagnosis not present

## 2020-07-11 DIAGNOSIS — R0789 Other chest pain: Secondary | ICD-10-CM | POA: Diagnosis not present

## 2020-07-11 DIAGNOSIS — M25512 Pain in left shoulder: Secondary | ICD-10-CM | POA: Diagnosis not present

## 2020-07-11 DIAGNOSIS — Z6841 Body Mass Index (BMI) 40.0 and over, adult: Secondary | ICD-10-CM | POA: Diagnosis not present

## 2020-07-11 DIAGNOSIS — R519 Headache, unspecified: Secondary | ICD-10-CM | POA: Diagnosis not present

## 2020-07-11 DIAGNOSIS — M545 Low back pain, unspecified: Secondary | ICD-10-CM | POA: Diagnosis not present

## 2020-07-11 DIAGNOSIS — W06XXXA Fall from bed, initial encounter: Secondary | ICD-10-CM | POA: Diagnosis not present

## 2020-07-11 DIAGNOSIS — T07XXXA Unspecified multiple injuries, initial encounter: Secondary | ICD-10-CM | POA: Diagnosis not present

## 2020-07-15 ENCOUNTER — Ambulatory Visit
Admission: RE | Admit: 2020-07-15 | Discharge: 2020-07-15 | Disposition: A | Discharge status: Home / Self Care | Payer: Medicare Other | Source: Ambulatory Visit | Attending: Sports Medicine | Admitting: Sports Medicine

## 2020-07-15 ENCOUNTER — Other Ambulatory Visit: Payer: Self-pay

## 2020-07-15 ENCOUNTER — Other Ambulatory Visit: Payer: Self-pay | Admitting: Sports Medicine

## 2020-07-15 DIAGNOSIS — S060X0A Concussion without loss of consciousness, initial encounter: Secondary | ICD-10-CM | POA: Diagnosis not present

## 2020-07-15 DIAGNOSIS — I6782 Cerebral ischemia: Secondary | ICD-10-CM | POA: Diagnosis not present

## 2020-07-15 DIAGNOSIS — I672 Cerebral atherosclerosis: Secondary | ICD-10-CM | POA: Diagnosis not present

## 2020-07-15 DIAGNOSIS — S0990XA Unspecified injury of head, initial encounter: Secondary | ICD-10-CM | POA: Diagnosis not present

## 2020-07-15 DIAGNOSIS — R42 Dizziness and giddiness: Secondary | ICD-10-CM | POA: Diagnosis not present

## 2020-07-25 ENCOUNTER — Telehealth: Payer: Self-pay | Admitting: *Deleted

## 2020-07-25 NOTE — Telephone Encounter (Signed)
Can you find a sooner appointment with ANY physician at [facility] please? When you do please call this physician back and let him know we moved her appointment up. If we don;t have anyone else sooner then hold on to this and give them any cancellation. Thank you.

## 2020-07-25 NOTE — Telephone Encounter (Signed)
Dr Inez Catalina contacted our office re: referral for concussion that he has treated pt for but requires neurology. He stated the pt fell about a month ago and hit her head on a nightstand. She has had symptoms c/w concussion such as  h/a x 1 month and also is having dizziness and difficulty walking. H/a unresponsive to Tylenol, cannot treat with NSAIDS or certain other medications due to age, etc. He ordered CT head which thankfully did not show SDH. Pt's appt with Dr Jaynee Eagles is on 08/30/20. Dr Sheppard Coil is asking either for an earlier appt or help with treatment in the interim as he is limited in what he can provide. He would like to be called back on his cell phone at [phone].

## 2020-07-26 NOTE — Telephone Encounter (Signed)
She is ocming in Thursday fyi

## 2020-07-26 NOTE — Telephone Encounter (Signed)
Patient saw Dr Jaynee Eagles first then was referred to Dr Brett Fairy for sleep.

## 2020-07-28 ENCOUNTER — Encounter: Payer: Self-pay | Admitting: Neurology

## 2020-07-28 ENCOUNTER — Other Ambulatory Visit: Payer: Self-pay

## 2020-07-28 ENCOUNTER — Ambulatory Visit (INDEPENDENT_AMBULATORY_CARE_PROVIDER_SITE_OTHER): Payer: Medicare Other | Admitting: Neurology

## 2020-07-28 VITALS — BP 152/83 | HR 110 | Ht 65.5 in | Wt 293.0 lb

## 2020-07-28 DIAGNOSIS — R631 Polydipsia: Secondary | ICD-10-CM | POA: Diagnosis not present

## 2020-07-28 DIAGNOSIS — R270 Ataxia, unspecified: Secondary | ICD-10-CM

## 2020-07-28 DIAGNOSIS — F0781 Postconcussional syndrome: Secondary | ICD-10-CM

## 2020-07-28 DIAGNOSIS — Z131 Encounter for screening for diabetes mellitus: Secondary | ICD-10-CM | POA: Diagnosis not present

## 2020-07-28 DIAGNOSIS — R7309 Other abnormal glucose: Secondary | ICD-10-CM | POA: Diagnosis not present

## 2020-07-28 DIAGNOSIS — G4733 Obstructive sleep apnea (adult) (pediatric): Secondary | ICD-10-CM | POA: Diagnosis not present

## 2020-07-28 DIAGNOSIS — S1980XA Other specified injuries of unspecified part of neck, initial encounter: Secondary | ICD-10-CM

## 2020-07-28 DIAGNOSIS — R5383 Other fatigue: Secondary | ICD-10-CM

## 2020-07-28 MED ORDER — MELOXICAM 7.5 MG PO TABS: 7.5000 mg | ORAL_TABLET | Freq: Every day | ORAL | 0 refills | Status: DC

## 2020-07-28 NOTE — Progress Notes (Signed)
[facility]    Provider:  Dr Jaynee Eagles Requesting Provider: Inez Catalina, MD Primary Care Provider:  Kelton Pillar, MD  CC:  concussion  HPI:  Amy [name] is a 74 y.o. female here as requested by Inez Catalina, MD for concussion and headache. PMHx obstructive sleep apnea (not compliant), peripheral vascular disease, hypertension, high cholesterol, fibromyalgia, dysphonia, depression, morbid obesity, headache, chronic back pain, anxiety.   She hit her head 06/27/2020 fell out of bed and hit head. She rolled out of bed and dound her head was hurt and her head was hurting badly. She is dizzy and she gets headaches, noise bothers her, the headaches "come and go" and sometimes they are worse than others. When she was standing she had to sit down because it started hurting. The headache happens when she stands. One day her girlfriend started taking loud and the headache started again. Headache is in the frontal area and sometimes back of the head too. She wakes up with a headache. She is not snoring through her cpap, she doesn't use the cpap machine all the time and those are the nights she wakes with headaches. She gets dizzy when she is standing, she drinks quite a bit of water, she is not dehydrated. She will be ok and the headaches come back. She gets them every day. One day last week she felt good and she also feel like a cloud over her head. She has hit her head before in [place] she fell a very long time ago in the 1980s. She feels like her voice is impaired and breaking up. She is forgetting things now. She feels like she is going to fall. She uses a cane and her husband bought her a walker and she has been using the cane for years because she has chronic low back pain. She can fall asleep right now she is so tired.  Not taking the meloxicam. Still on the Gabapentin.   Reviewed notes, labs and imaging from outside physicians, which showed:  CT Head:  IMPRESSION: No acute or traumatic finding. Mild age related volume loss and chronic small-vessel ischemic change of the white matter and basal ganglia.  Review of Systems: Patient complains of symptoms per HPI as well as the following symptoms fatigue, excessive daytime sleepiness, headache, dizziness, gait abnormality. Pertinent negatives and positives per HPI. All others negative.   Social History   Socioeconomic History  . Marital status: Married    Spouse name: Not on file  . Number of children: 1  . Years of education: Not on file  . Highest education level: Master's degree (e.g., MA, MS, MEng, MEd, MSW, MBA)  Occupational History  . Not on file  Tobacco Use  . Smoking status: Former Smoker    Quit date: 07/10/1969    Years since quitting: 51.0  . Smokeless tobacco: Never Used  Vaping Use  . Vaping Use: Never used  Substance and Sexual Activity  . Alcohol use: Not Currently    Comment: rare, social  . Drug use: No  . Sexual activity: Not on file  Other Topics Concern  . Not on file  Social History Narrative   Lives at home with husband    Right handed   Caffeine: max 2 cups coffee per day but not does not drink daily.   Social Determinants of Health   Financial Resource Strain:   . Difficulty of Paying Living Expenses: Not on file  Food Insecurity:   . Worried About  Running Out of Food in the Last Year: Not on file  . Ran Out of Food in the Last Year: Not on file  Transportation Needs:   . Lack of Transportation (Medical): Not on file  . Lack of Transportation (Non-Medical): Not on file  Physical Activity:   . Days of Exercise per Week: Not on file  . Minutes of Exercise per Session: Not on file  Stress:   . Feeling of Stress : Not on file  Social Connections:   . Frequency of Communication with Friends and Family: Not on file  . Frequency of Social Gatherings with Friends and Family: Not on file  . Attends Religious Services: Not on file  . Active Member  of Clubs or Organizations: Not on file  . Attends Archivist Meetings: Not on file  . Marital Status: Not on file  Intimate Partner Violence:   . Fear of Current or Ex-Partner: Not on file  . Emotionally Abused: Not on file  . Physically Abused: Not on file  . Sexually Abused: Not on file    Family History  Problem Relation Age of Onset  . Hypertension Mother   . Cancer Mother        cervix  . Kidney disease Mother   . Osteoarthritis Mother   . Diabetes Mother   . Hypertension Sister   . Cancer Sister        polyps  . Kidney disease Brother   . Cancer Daughter        leukemia  . Hypertension Brother   . Liver cancer Brother   . Hypertension Father   . Colon cancer Father   . Colon polyps Father   . Hypertension Brother   . Liver cancer Brother   . Aneurysm Maternal Grandmother   . Migraines Neg Hx   . Headache Neg Hx     Past Medical History:  Diagnosis Date  . Abdominal pain of unknown etiology 02/05/2016  . Anxiety   . Arthritis    ddd- RA  . Asthma    "sleeping asthma"  . Chronic back pain    lumbar steroid injection's  . Complication of anesthesia    Hard to wake up. Pt sts is took 3 days.  . Constipation   . Depression   . Dysphonia    intermittent "voice changes"  . Esophageal dysmotility   . Fibromyalgia   . Fibromyalgia   . GERD (gastroesophageal reflux disease)   . Glaucoma   . Headache   . High cholesterol    ? pt states her doctor told her to continue eating vegetables & fruit  . History of DVT (deep vein thrombosis) 1989   LEFT UPPER ARM  . History of hiatal hernia   . History of MRSA infection 2011  . Hypertension   . Irregular heart rate    "years ago"  . Low iron   . Lumbago   . Mild obstructive sleep apnea    per study 02-07-2006 - no cpap  . Neuropathy    feet  . Pelvic pain in female   . Peripheral vascular disease ([place])    "poor circulation"  . SBO (small bowel obstruction) ([place]) 06/2016  . Seasonal allergies     . Short of breath on exertion   . Urinary frequency   . Vaginal atrophy   . Weakness    both hands and feet    Patient Active Problem List   Diagnosis Date Noted  . Uncontrolled  daytime somnolence 04/12/2019  . Snoring 01/22/2019  . Morbid obesity with body mass index of 45.0-49.9 in adult [facility]) 01/22/2019  . OSA on CPAP NOT COMPLIANT 01/22/2019  . Wound dehiscence 07/22/2016  . Dyspnea 07/21/2016  . Acute encephalopathy 07/21/2016  . Anemia 07/21/2016  . Pancreatitis 07/02/2016  . SBO (small bowel obstruction) ([place]) 07/02/2016  . Long term current use of opiate analgesic 06/26/2016  . Long term prescription opiate use 06/26/2016  . Opiate use 06/26/2016  . Encounter for therapeutic drug level monitoring 06/26/2016  . Encounter for pain management planning 06/26/2016  . Chronic pain 06/26/2016  . Disturbance of skin sensation 06/26/2016  . Chronic low back pain (Location of Primary Source of Pain) (Bilateral) (R>L) 06/26/2016  . Chronic hip pain (Location of Secondary source of pain) (Bilateral) (R>L) 06/26/2016  . 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 Chronic shoulder pain (Location of Tertiary source of pain) (Bilateral) (R>L) 06/26/2016  . 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 Chronic knee pain (Bilateral) (R>L) 06/26/2016  . 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 Chronic neck pain (Right) 06/26/2016  . Lumbar spondylosis 06/26/2016  . Lumbar facet syndrome 06/26/2016  . Lumbar facet hypertrophy 06/26/2016  . 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 Neurogenic pain 06/26/2016  . Occipital headaches 06/26/2016  . 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 History of lumbar fusion 06/26/2016  . 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  . Neuropathic pain of both legs 10/13/2015  . Constipation 10/13/2015  . Complete rotator cuff tear of left shoulder 05/05/2014  . Depression  08/06/2013  . Hypokalemia 08/06/2013  . GERD (gastroesophageal reflux disease) 08/06/2013  . Spinal stenosis of lumbar region 08/06/2013  . Bladder spasm 08/06/2013  . Morbid obesity ([place]) 02/03/2013  . Lipoma of buttock s/p excision 02/20/2013 02/03/2013  . DYSPNEA 01/23/2008  . Obstructive sleep apnea 09/12/2007  . HTN (hypertension) 09/12/2007  . Allergic rhinitis 09/12/2007  . SLEEPINESS 09/12/2007  . ANGINA, HX OF 09/12/2007    Past Surgical History:  Procedure Laterality Date  . abdominal adhesions removed    . ABDOMINAL HYSTERECTOMY  1978  . BACK SURGERY    . BUNIONECTOMY Left 2008  . CATARACT EXTRACTION Bilateral 2017  . COLON SURGERY    . COLONOSCOPY    . CYSTO WITH HYDRODISTENSION N/A 07/12/2015   Procedure: CYSTOSCOPY/HYDRODISTENSION;  Surgeon: Bjorn Loser, MD;  Location: [facility];  Service: Urology;  Laterality: N/A;  . DILATION AND CURETTAGE OF UTERUS    . ESOPHAGEAL MANOMETRY N/A 11/22/2014   Procedure: ESOPHAGEAL MANOMETRY (EM);  Surgeon: Winfield Cunas., MD;  Location: [facility];  Service: Endoscopy;  Laterality: N/A;  . ESOPHAGOGASTRODUODENOSCOPY N/A 07/15/2013   Procedure: ESOPHAGOGASTRODUODENOSCOPY (EGD);  Surgeon: Winfield Cunas., MD;  Location: [facility];  Service: Endoscopy;  Laterality: N/A;  need xray  . EXPLORATORY LAPAROTOMY    . EYE SURGERY Bilateral    cataract surgery with lens implants  . HARDWARE REMOVAL N/A 01/02/2016   Procedure: Exploration of Lumbar Fusion,Removal of hardware Lumbar One-Two ;Redo Posterior Lumbar Fusion Lumbar One-Two;  Surgeon: Kary Kos, MD;  Location: [place] [facility];  Service: Neurosurgery;  Laterality: N/A;  . ingrown toenail removal    . LAPAROSCOPIC CHOLECYSTECTOMY  01-11-2006  . LAPAROSCOPY N/A 07/06/2016   Procedure: LAPAROSCOPY DIAGNOSTIC EMERGENT TO OPEN;  Surgeon: Greer Pickerel, MD;  Location: [place];  Service: General;  Laterality: N/A;  . LAPAROTOMY N/A 07/06/2016  Procedure:  EXPLORATORY LAPAROTOMY LYSIS OF ADHESIONS FOR 3 HOURS;  Surgeon: Greer Pickerel, MD;  Location: [place];  Service: General;  Laterality: N/A;  . lipoma removal    . LUMBAR FUSION  2014   L4 -- L5  . MASS EXCISION Left 02/20/2013   Procedure: EXCISION LEFT BUTTOCK  MASS;  Surgeon: Adin Hector, MD;  Location: [facility];  Service: General;  Laterality: Left;  . NOSE SURGERY  2007  . REMOVAL HARDWARE L4-L5/  BILATERAL LAMINECTOMY L2 - L5 AND FUSION  06-12-2011  . SHOULDER OPEN ROTATOR CUFF REPAIR Left 05/05/2014   Procedure: OPEN ACROMIONECTOMY AND OPEN REPAIR OF ROTATOR CUFF, TISSUEMEND GRAFT WITH ANCHOR ;  Surgeon: Tobi Bastos, MD;  Location: [facility];  Service: Orthopedics;  Laterality: Left;  . SPINE SURGERY     x6  . TONSILLECTOMY      Current Outpatient Medications  Medication Sig Dispense Refill  . acetaminophen (TYLENOL) 500 MG tablet Take 1,000 mg by mouth every 6 (six) hours as needed for moderate pain.     Marland Kitchen azelaic acid (AZELEX) 20 % cream Apply 1 application topically 2 (two) times daily. After skin is thoroughly washed and patted dry, gently but thoroughly massage a thin film of azelaic acid cream into the affected area twice daily, in the morning and evening.    . B Complex Vitamins (B COMPLEX PO) Take by mouth.    . calcium-vitamin D (OSCAL WITH D) 500-200 MG-UNIT tablet Take 1 tablet by mouth daily with breakfast.    . cloNIDine (CATAPRES) 0.1 MG tablet Take 1 tablet (0.1 mg total) by mouth 3 (three) times daily. 60 tablet 11  . diclofenac sodium (VOLTAREN) 1 % GEL Apply 4 g topically 4 (four) times daily. 100 g 0  . diphenhydrAMINE (BENADRYL) 25 MG tablet Take 25 mg by mouth 2 (two) times daily as needed for allergies.    Marland Kitchen estradiol (ESTRACE) 0.1 MG/GM vaginal cream Place 1 Applicatorful vaginally 2 (two) times a week. (Patient taking differently: Place 1 Applicatorful vaginally 2 (two) times a week. Tuesday and Thursday) 42.5 g 12  . Fluoxetine HCl, PMDD, 10 MG TABS Take by  mouth.    . fluticasone (FLONASE) 50 MCG/ACT nasal spray Place 1 spray into both nostrils daily.  2  . furosemide (LASIX) 20 MG tablet Take 20 mg by mouth daily as needed.    . gabapentin (NEURONTIN) 600 MG tablet Take 600 mg by mouth 2 (two) times daily.    . hydrALAZINE (APRESOLINE) 50 MG tablet Take 50 mg by mouth 3 (three) times daily.    . hydrochlorothiazide (HYDRODIURIL) 12.5 MG tablet Take 12.5 mg by mouth daily.    Marland Kitchen losartan (COZAAR) 100 MG tablet TK 1 T PO ONCE D    . Menthol-Methyl Salicylate (MUSCLE RUB EX) Apply 1 application topically 2 (two) times daily as needed (pain in neck and back).     . methocarbamol (ROBAXIN) 500 MG tablet Take 500 mg by mouth 2 (two) times daily as needed for muscle spasms.    . metoprolol (LOPRESSOR) 50 MG tablet Take 1 tablet (50 mg total) by mouth 2 (two) times daily.    Marland Kitchen NIFEdipine (PROCARDIA) 10 MG capsule Take 10 mg by mouth 3 (three) times daily.    . ondansetron (ZOFRAN-ODT) 4 MG disintegrating tablet DIS 1 T ON THE TONGUE TID PRN    . OVER THE COUNTER MEDICATION Take 2 tablets by mouth daily. Swiss Kriss OTC herbal laxative    .  pantoprazole (PROTONIX) 40 MG tablet Take 80 mg by mouth daily.   0  . PARoxetine (PAXIL) 30 MG tablet Take 30 mg by mouth daily.    . potassium chloride SA (K-DUR,KLOR-CON) 20 MEQ tablet Take 20 mEq by mouth daily.    . predniSONE (STERAPRED UNI-PAK 21 TAB) 10 MG (21) TBPK tablet Take as directed 21 tablet 0  . tapentadol (NUCYNTA) 50 MG tablet Take 50 mg by mouth every 6 (six) hours as needed for moderate pain.    Marland Kitchen triamcinolone ointment (KENALOG) 0.5 % Apply 1 application topically 2 (two) times daily. To rash to feet 30 g 0  . vitamin B-12 (CYANOCOBALAMIN) 1000 MCG tablet Take 1,000 mcg by mouth daily.    . vitamin E (VITAMIN E) 400 UNIT capsule Take 400 Units by mouth daily.    . meloxicam (MOBIC) 7.5 MG tablet Take 1 tablet (7.5 mg total) by mouth daily. 15 tablet 0   No current facility-administered  medications for this visit.    Allergies as of 07/28/2020 - Review Complete 07/28/2020  Allergen Reaction Noted  . Latex Other (See Comments) 02/05/2013  . Crestor [rosuvastatin calcium]  12/01/2018  . Penicillins Other (See Comments) 02/03/2013  . Tetanus toxoids Rash 12/01/2018  . Tomato Rash 10/15/2017    Vitals: BP (!) 152/83 (BP Location: Right Arm, Patient Position: Sitting)   Pulse (!) 110   Ht 5' 5.5" (1.664 m)   Wt 293 lb (132.9 kg)   BMI 48.02 kg/m  Last Weight:  Wt Readings from Last 1 Encounters:  07/28/20 293 lb (132.9 kg)   Last Height:   Ht Readings from Last 1 Encounters:  07/28/20 5' 5.5" (1.664 m)     Physical exam: Exam: Gen: NAD, appears tired, morbidly obese             CV: RRR, no MRG. No Carotid Bruits. No peripheral edema, warm, nontender Eyes: Conjunctivae clear without exudates or hemorrhage  Neuro: Detailed Neurologic Exam  Speech:    Speech is normal; fluent and spontaneous with normal comprehension.  Cognition:  Cranial Nerves:     The pupils are equal, round, and reactive to light. The fundi are normal and spontaneous venous pulsations are present. Visual fields are full to finger confrontation. Extraocular movements are intact. Trigeminal sensation is intact and the muscles of mastication are normal. Left eye slight proptosis and right lid retraction, bilat ptosis otherwise face is symmetric. The palate elevates in the midline. Hearing intact. Voice is normal. Shoulder shrug is normal. The tongue has normal motion without fasciculations.   Coordination:    No dysmetria or ataxia  Gait:    Cautious but good stride and stance, stood up out of chair independently and independently walked  Motor Observation:    No asymmetry, no atrophy, and no involuntary movements noted. Tone:    Normal muscle tone.    Posture:    Posture is normal. normal erect    Strength: Poor effort but strength is V/V in the upper and lower limbs.        Sensation: intact to LT     Reflex Exam:  DTR's:    Deep tendon reflexes in the upper and lower extremities are normal bilaterally.   Toes:    The toes are equiv bilaterally.   Clonus:    Clonus is absent.    Assessment/Plan:   74 y.o. female here as requested by Inez Catalina, MD for concussion and headache. PMHx obstructive sleep apnea (not compliant), peripheral  vascular disease, hypertension, high cholesterol, fibromyalgia, dysphonia, depression, morbid obesity, headache, chronic back pain, anxiety.   - Patient was less than honest with me about her cpap use, initially endorsed compliance, only remembered she wasn't using it until I showed her the report that did not show activity since August.  This may be why she is having morning/nocturnal headaches and complains of severe fatigue today. Encouraged her to use it. Discussed sequelae of untreated sleep apnea which includes many of her symptoms today.Moderate degree of obstructive Sleep apnea was found, with an AHI of 24.5 /h  - She has had dizziness and headaches prior to her head bump, but likely exacerbated by post-concussive symptoms Reassured patient, CT of the head showed nothing acute. Will order CT neck to ensure no other etiology of her reported gait problems. Can use meloxicam for 2 weeks(stated she was not on it anymore) and in the meantime restart CPAP which may tremendously help.   - states no one has checked her blood work since her head trauma, will order labs including [id number](she has been excessively thirsty)  Orders Placed This Encounter  Procedures  . CT CERVICAL SPINE WO CONTRAST  . CBC with Differential/Platelets  . Comprehensive metabolic panel  . Hemoglobin A1c  . TSH   Meds ordered this encounter  Medications  . meloxicam (MOBIC) 7.5 MG tablet    Sig: Take 1 tablet (7.5 mg total) by mouth daily.    Dispense:  15 tablet    Refill:  0    Cc: Inez Catalina, MD,  Kelton Pillar, MD  Sarina Ill,  MD  [facility] [address] [place], [place] [address]  Phone [phone] Fax [phone]  I spent 50 minutes of face-to-face and non-face-to-face time with patient on the  1. Post concussion syndrome   2. OSA (obstructive sleep apnea) NOT COMPLIANT   3. Fatigue, unspecified type   4. Diabetes mellitus screening   5. Elevated glucose   6. Ataxia   7. Blunt trauma of neck, initial encounter   8. Excessive thirst    diagnosis.  This included previsit chart review, lab review, study review, order entry, electronic health record documentation, patient education on the different diagnostic and therapeutic options, counseling and coordination of care, risks and benefits of management, compliance, or risk factor reduction

## 2020-07-28 NOTE — Patient Instructions (Addendum)
PLEASE USE CPAP CT of the cervical spine Blood work today Meloxicam daily for 2 weeks   Post-Concussion Syndrome  Post-concussion syndrome is when symptoms last longer than normal after a head injury. What are the signs or symptoms? After a head injury, you may:  Have headaches.  Feel tired.  Feel dizzy.  Feel weak.  Have trouble seeing.  Have trouble in bright lights.  Have trouble hearing.  Not be able to remember things.  Not be able to focus.  Have trouble sleeping.  Have mood swings.  Have trouble learning new things. These can last from weeks to months. Follow these instructions at home: Medicines  Take all medicines only as told by your doctor.  Do not take prescription pain medicines. Activity  Limit activities as told by your doctor. This includes: ? Homework. ? Job-related work. ? Thinking. ? Watching TV. ? Using a computer or phone. ? Puzzles. ? Exercise. ? Sports.  Slowly return to your normal activity as told by your doctor.  Stop an activity if you have symptoms.  Do not do anything that may cause you to get injured again. General instructions  Rest. Try to: ? Sleep 7-9 hours each night. ? Take naps or breaks when you feel tired during the day.  Do not drink alcohol until your doctor says that you can.  Keep track of your symptoms.  Keep all follow-up visits as told by your doctor. This is important. Contact a doctor if:  You do not improve.  You get worse.  You have another injury. Get help right away if:  You have a very bad headache.  You feel confused.  You feel very sleepy.  You pass out (faint).  You throw up (vomit).  You feel weak in any part of your body.  You feel numb in any part of your body.  You start shaking (have a seizure).  You have trouble talking. Summary  Post-concussion syndrome is when symptoms last longer than normal after a head injury.  Limit all activity after your injury.  Gradually return to normal activity as told by your doctor.  Rest, do not drink alcohol, and avoid prescription pain medicines after a concussion.  Call your doctor if your symptoms get worse. This information is not intended to replace advice given to you by your health care provider. Make sure you discuss any questions you have with your health care provider. Document Revised: 07/03/2018 Document Reviewed: 10/15/2017 Elsevier Patient Education  [place].  Meloxicam capsules What is this medicine? MELOXICAM (mel OX i cam) is a non-steroidal anti-inflammatory drug (NSAID). It is used to reduce swelling and to treat pain. It is used for osteoarthritis. This medicine may be used for other purposes; ask your health care provider or pharmacist if you have questions. COMMON BRAND NAME(S): Vivlodex What should I tell my health care provider before I take this medicine? They need to know if you have any of these conditions:  bleeding disorders  cigarette smoker  coronary artery bypass graft (CABG) surgery within the past 2 weeks  drink more than 3 alcohol-containing drinks per day  heart disease  high blood pressure  history of stomach bleeding  kidney disease  liver disease  lung or breathing disease, like asthma  stomach or intestine problems  an unusual or allergic reaction to meloxicam, aspirin, other NSAIDs, other medicines, foods, dyes, or preservatives  pregnant or trying to get pregnant  breast-feeding How should I use this medicine? Take this  medicine by mouth with a full glass of water. Follow the directions on the prescription label. You can take it with or without food. If it upsets your stomach, take it with food. Take your medicine at regular intervals. Do not take it more often than directed. Do not stop taking except on your doctor's advice. A special MedGuide will be given to you by the pharmacist with each prescription and refill. Be sure to read  this information carefully each time. Talk to your pediatrician regarding the use of this medicine in children. Special care may be needed. Patients over 75 years old may have a stronger reaction and need a smaller dose. Overdosage: If you think you have taken too much of this medicine contact a poison control center or emergency room at once. NOTE: This medicine is only for you. Do not share this medicine with others. What if I miss a dose? If you miss a dose, take it as soon as you can. If it is almost time for your next dose, take only that dose. Do not take double or extra doses. What may interact with this medicine? Do not take this medicine with any of the following medications:  cidofovir  ketorolac This medicine may also interact with the following medications:  aspirin and aspirin-like medicines  certain medicines for blood pressure, heart disease, irregular heart beat  certain medicines for depression, anxiety, or psychotic disturbances  certain medicines that treat or prevent blood clots like warfarin, enoxaparin, dalteparin, apixaban, dabigatran, rivaroxaban  cyclosporine  diuretics  fluconazole  lithium  methotrexate  other NSAIDs, medicines for pain and inflammation, like ibuprofen and naproxen  pemetrexed This list may not describe all possible interactions. Give your health care provider a list of all the medicines, herbs, non-prescription drugs, or dietary supplements you use. Also tell them if you smoke, drink alcohol, or use illegal drugs. Some items may interact with your medicine. What should I watch for while using this medicine? Tell your doctor or healthcare provider if your symptoms do not start to get better or if they get worse. This medicine may cause serious skin reactions. They can happen weeks to months after starting the medicine. Contact your healthcare provider right away if you notice fevers or flu-like symptoms with a rash. The rash may be  red or purple and then turn into blisters or peeling of the skin. Or, you might notice a red rash with swelling of the face, lips or lymph nodes in your neck or under your arms. Do not take other medicines that contain aspirin, ibuprofen, or naproxen with this medicine. Side effects such as stomach upset, nausea, or ulcers may be more likely to occur. Many medicines available without a prescription should not be taken with this medicine. This medicine can cause ulcers and bleeding in the stomach and intestines at any time during treatment. This can happen with no warning and may cause death. There is increased risk with taking this medicine for a long time. Smoking, drinking alcohol, older age, and poor health can also increase risks. Call your doctor right away if you have stomach pain or blood in your vomit or stool. This medicine does not prevent heart attack or stroke. In fact, this medicine may increase the chance of a heart attack or stroke. The chance may increase with longer use of this medicine and in people who have heart disease. If you take aspirin to prevent heart attack or stroke, talk with your doctor or healthcare provider. What  side effects may I notice from receiving this medicine? Side effects that you should report to your doctor or health care professional as soon as possible:  allergic reactions like skin rash, itching or hives, swelling of the face, lips, or tongue  nausea, vomiting  redness, blistering, peeling, or loosening of the skin, including inside the mouth  signs and symptoms of a blood clot such as breathing problems; changes in vision; chest pain; severe, sudden headache; pain, swelling, warmth in the leg; trouble speaking; sudden numbness or weakness of the face, arm, or leg  signs and symptoms of bleeding such as bloody or black, tarry stools; red or dark-brown urine; spitting up blood or brown material that looks like coffee grounds; red spots on the skin; unusual  bruising or bleeding from the eye, gums, or nose  signs and symptoms of liver injury like dark yellow or brown urine; general ill feeling or flu-like symptoms; light-colored stools; loss of appetite; nausea; right upper belly pain; unusually weak or tired; yellowing of the eyes or skin  signs and symptoms of stroke like changes in vision; confusion; trouble speaking or understanding; severe headaches; sudden numbness or weakness of the face, arm, or leg; trouble walking; dizziness; loss of balance or coordination Side effects that usually do not require medical attention (report to your doctor or health care professional if they continue or are bothersome):  constipation  diarrhea  gas This list may not describe all possible side effects. Call your doctor for medical advice about side effects. You may report side effects to FDA at 1-800-FDA-1088. Where should I keep my medicine? Keep out of the reach of children. Store at room temperature between 15 and 30 degrees C (59 and 86 degrees F). Throw away any unused medicine after the expiration date. NOTE: This sheet is a summary. It may not cover all possible information. If you have questions about this medicine, talk to your doctor, pharmacist, or health care provider.  2020 Elsevier/Gold Standard (2018-12-10 11:19:03)

## 2020-07-29 LAB — CBC WITH DIFFERENTIAL/PLATELET
Basophils Absolute: 0 10*3/uL (ref 0.0–0.2)
Basos: 1 %
EOS (ABSOLUTE): 0.1 10*3/uL (ref 0.0–0.4)
Eos: 2 %
Hematocrit: 41.8 % (ref 34.0–46.6)
Hemoglobin: 13.9 g/dL (ref 11.1–15.9)
Immature Grans (Abs): 0 10*3/uL (ref 0.0–0.1)
Immature Granulocytes: 0 %
Lymphocytes Absolute: 1.2 10*3/uL (ref 0.7–3.1)
Lymphs: 24 %
MCH: 30.2 pg (ref 26.6–33.0)
MCHC: 33.3 g/dL (ref 31.5–35.7)
MCV: 91 fL (ref 79–97)
Monocytes Absolute: 0.7 10*3/uL (ref 0.1–0.9)
Monocytes: 14 %
Neutrophils Absolute: 3.1 10*3/uL (ref 1.4–7.0)
Neutrophils: 59 %
Platelets: 325 10*3/uL (ref 150–450)
RBC: 4.61 x10E6/uL (ref 3.77–5.28)
RDW: 13.9 % (ref 11.7–15.4)
WBC: 5.1 10*3/uL (ref 3.4–10.8)

## 2020-07-29 LAB — COMPREHENSIVE METABOLIC PANEL
ALT: 15 IU/L (ref 0–32)
AST: 18 IU/L (ref 0–40)
Albumin/Globulin Ratio: 1.4 (ref 1.2–2.2)
Albumin: 4 g/dL (ref 3.7–4.7)
Alkaline Phosphatase: 125 IU/L — ABNORMAL HIGH (ref 44–121)
BUN/Creatinine Ratio: 12 (ref 12–28)
BUN: 12 mg/dL (ref 8–27)
Bilirubin Total: 0.4 mg/dL (ref 0.0–1.2)
CO2: 27 mmol/L (ref 20–29)
Calcium: 9 mg/dL (ref 8.7–10.3)
Chloride: 101 mmol/L (ref 96–106)
Creatinine, Ser: 0.98 mg/dL (ref 0.57–1.00)
GFR calc Af Amer: 66 mL/min/{1.73_m2} (ref >59)
GFR calc non Af Amer: 57 mL/min/{1.73_m2} — ABNORMAL LOW (ref >59)
Globulin, Total: 2.8 g/dL (ref 1.5–4.5)
Glucose: 123 mg/dL — ABNORMAL HIGH (ref 65–99)
Potassium: 4.4 mmol/L (ref 3.5–5.2)
Sodium: 141 mmol/L (ref 134–144)
Total Protein: 6.8 g/dL (ref 6.0–8.5)

## 2020-07-29 LAB — HEMOGLOBIN A1C
Est. average glucose Bld gHb Est-mCnc: 137 mg/dL
Hgb A1c MFr Bld: 6.4 % — ABNORMAL HIGH (ref 4.8–5.6)

## 2020-07-29 LAB — TSH: TSH: 1.77 u[IU]/mL (ref 0.450–4.500)

## 2020-08-01 ENCOUNTER — Telehealth: Payer: Self-pay | Admitting: Neurology

## 2020-08-01 DIAGNOSIS — R262 Difficulty in walking, not elsewhere classified: Secondary | ICD-10-CM | POA: Diagnosis not present

## 2020-08-01 DIAGNOSIS — M6281 Muscle weakness (generalized): Secondary | ICD-10-CM | POA: Diagnosis not present

## 2020-08-01 DIAGNOSIS — R519 Headache, unspecified: Secondary | ICD-10-CM | POA: Diagnosis not present

## 2020-08-01 DIAGNOSIS — Z9181 History of falling: Secondary | ICD-10-CM | POA: Diagnosis not present

## 2020-08-01 NOTE — Telephone Encounter (Signed)
medicare/aarp order sent to [facility]. No auth they will reach out to the patient to schedule.  °

## 2020-08-02 ENCOUNTER — Telehealth: Payer: Self-pay | Admitting: Neurology

## 2020-08-02 ENCOUNTER — Telehealth: Payer: Self-pay | Admitting: *Deleted

## 2020-08-02 NOTE — Telephone Encounter (Signed)
Called pt and LVM asking for call back. Left office number in message. When she calls back, it is ok to give the results to her and then let us know if she has further questions.   She is almost diabetic. She should follow-up with her primary care doctor, otherwise the labs are unremarkable. Her hemoglobin A1C is 6.4.

## 2020-08-02 NOTE — Telephone Encounter (Signed)
I called pt. She threw away the strap to her cpap mask on accident. She needs a replacement. I will reach out to [facility] for assistance on this. Pt verbalized understanding.

## 2020-08-02 NOTE — Telephone Encounter (Signed)
by accident pt tossed the strap to her CPAP, pt has been without it for 4-5 days.  Pt states she tried to make a replacement but it has affected her readings, please call.

## 2020-08-02 NOTE — Telephone Encounter (Signed)
Pt called back and the message was relayed.  Pt has scheduled with her PCP, this is FYI no call back requested

## 2020-08-02 NOTE — Telephone Encounter (Signed)
-----   Message from Amy Beam, MD sent at 07/30/2020  6:07 PM EDT ----- Patient is almost diabetic, she should follow up with her primary care (hgba1c 6.4) otherwise unremarkable blood work thanks.

## 2020-08-03 NOTE — Telephone Encounter (Signed)
Received this notice from [facility]: "We can send one, no need for a new order."

## 2020-08-03 NOTE — Telephone Encounter (Signed)
Noted thanks °

## 2020-08-08 DIAGNOSIS — Z9181 History of falling: Secondary | ICD-10-CM | POA: Diagnosis not present

## 2020-08-08 DIAGNOSIS — R262 Difficulty in walking, not elsewhere classified: Secondary | ICD-10-CM | POA: Diagnosis not present

## 2020-08-08 DIAGNOSIS — R519 Headache, unspecified: Secondary | ICD-10-CM | POA: Diagnosis not present

## 2020-08-08 DIAGNOSIS — M6281 Muscle weakness (generalized): Secondary | ICD-10-CM | POA: Diagnosis not present

## 2020-08-10 DIAGNOSIS — R519 Headache, unspecified: Secondary | ICD-10-CM | POA: Diagnosis not present

## 2020-08-10 DIAGNOSIS — Z9181 History of falling: Secondary | ICD-10-CM | POA: Diagnosis not present

## 2020-08-10 DIAGNOSIS — M6281 Muscle weakness (generalized): Secondary | ICD-10-CM | POA: Diagnosis not present

## 2020-08-10 DIAGNOSIS — R262 Difficulty in walking, not elsewhere classified: Secondary | ICD-10-CM | POA: Diagnosis not present

## 2020-08-11 ENCOUNTER — Other Ambulatory Visit: Payer: Self-pay | Admitting: Neurology

## 2020-08-11 ENCOUNTER — Other Ambulatory Visit: Payer: Medicare Other

## 2020-08-11 DIAGNOSIS — M48062 Spinal stenosis, lumbar region with neurogenic claudication: Secondary | ICD-10-CM | POA: Diagnosis not present

## 2020-08-11 DIAGNOSIS — Z6841 Body Mass Index (BMI) 40.0 and over, adult: Secondary | ICD-10-CM | POA: Diagnosis not present

## 2020-08-11 DIAGNOSIS — M533 Sacrococcygeal disorders, not elsewhere classified: Secondary | ICD-10-CM | POA: Diagnosis not present

## 2020-08-11 DIAGNOSIS — M961 Postlaminectomy syndrome, not elsewhere classified: Secondary | ICD-10-CM | POA: Diagnosis not present

## 2020-08-11 DIAGNOSIS — I10 Essential (primary) hypertension: Secondary | ICD-10-CM | POA: Diagnosis not present

## 2020-08-15 DIAGNOSIS — R262 Difficulty in walking, not elsewhere classified: Secondary | ICD-10-CM | POA: Diagnosis not present

## 2020-08-15 DIAGNOSIS — R519 Headache, unspecified: Secondary | ICD-10-CM | POA: Diagnosis not present

## 2020-08-15 DIAGNOSIS — Z9181 History of falling: Secondary | ICD-10-CM | POA: Diagnosis not present

## 2020-08-15 DIAGNOSIS — M6281 Muscle weakness (generalized): Secondary | ICD-10-CM | POA: Diagnosis not present

## 2020-08-26 ENCOUNTER — Ambulatory Visit
Admission: RE | Admit: 2020-08-26 | Discharge: 2020-08-26 | Disposition: A | Discharge status: Home / Self Care | Payer: Medicare Other | Source: Ambulatory Visit | Attending: Neurology | Admitting: Neurology

## 2020-08-26 DIAGNOSIS — M47812 Spondylosis without myelopathy or radiculopathy, cervical region: Secondary | ICD-10-CM | POA: Diagnosis not present

## 2020-08-26 DIAGNOSIS — S1980XA Other specified injuries of unspecified part of neck, initial encounter: Secondary | ICD-10-CM

## 2020-08-26 DIAGNOSIS — S199XXA Unspecified injury of neck, initial encounter: Secondary | ICD-10-CM | POA: Diagnosis not present

## 2020-08-26 DIAGNOSIS — R270 Ataxia, unspecified: Secondary | ICD-10-CM | POA: Diagnosis not present

## 2020-08-26 DIAGNOSIS — M47892 Other spondylosis, cervical region: Secondary | ICD-10-CM | POA: Diagnosis not present

## 2020-08-30 ENCOUNTER — Institutional Professional Consult (permissible substitution): Payer: Medicare Other | Admitting: Neurology

## 2020-09-06 DIAGNOSIS — R32 Unspecified urinary incontinence: Secondary | ICD-10-CM | POA: Diagnosis not present

## 2020-09-06 DIAGNOSIS — I10 Essential (primary) hypertension: Secondary | ICD-10-CM | POA: Diagnosis not present

## 2020-09-06 DIAGNOSIS — R0789 Other chest pain: Secondary | ICD-10-CM | POA: Diagnosis not present

## 2020-09-06 DIAGNOSIS — E7800 Pure hypercholesterolemia, unspecified: Secondary | ICD-10-CM | POA: Diagnosis not present

## 2020-09-12 ENCOUNTER — Encounter: Payer: Self-pay | Admitting: *Deleted

## 2020-09-20 IMAGING — MG DIGITAL SCREENING BILAT W/ TOMO W/ CAD
8 of 14 series · 8 of 40 positions shown · non-contrast
Comparison: Previous exam(s).

CLINICAL DATA: Screening.

EXAM:
DIGITAL SCREENING BILATERAL MAMMOGRAM WITH TOMO AND CAD

[R CV synth-2D]
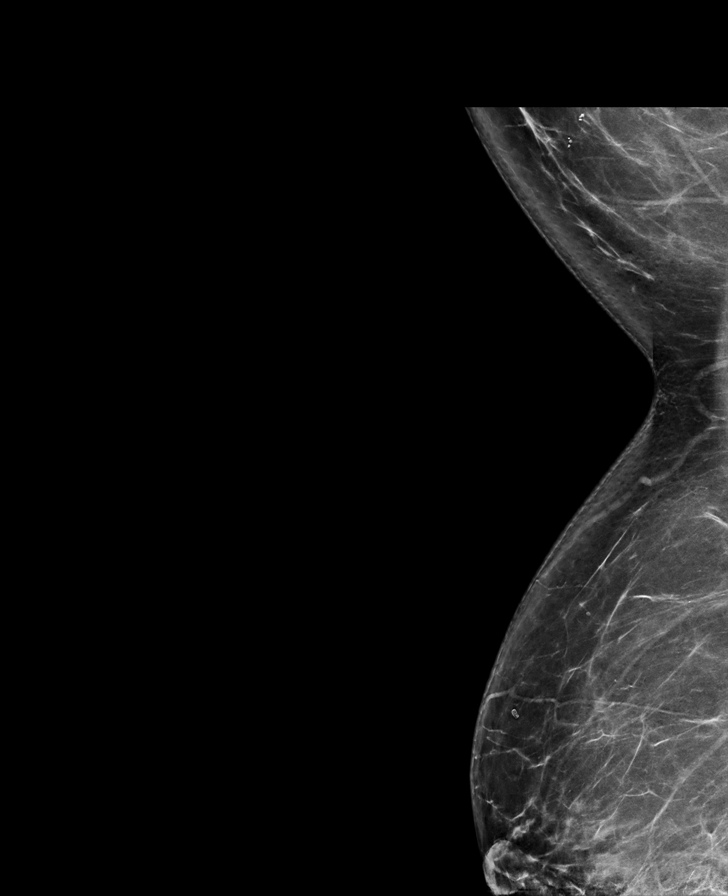

[L MLO synth-2D (1 of 2)]
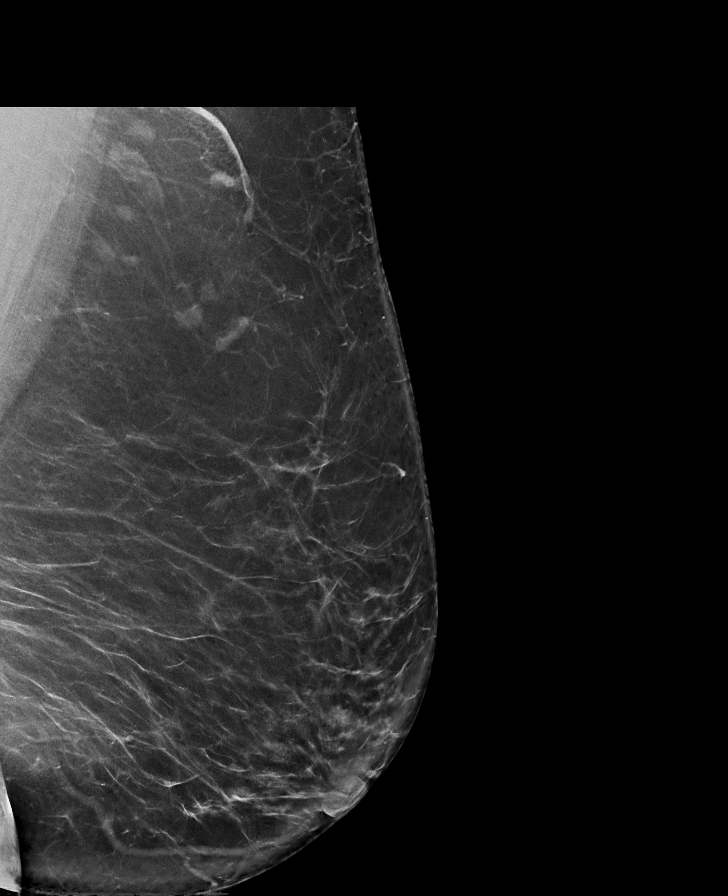

[R CC synth-2D]
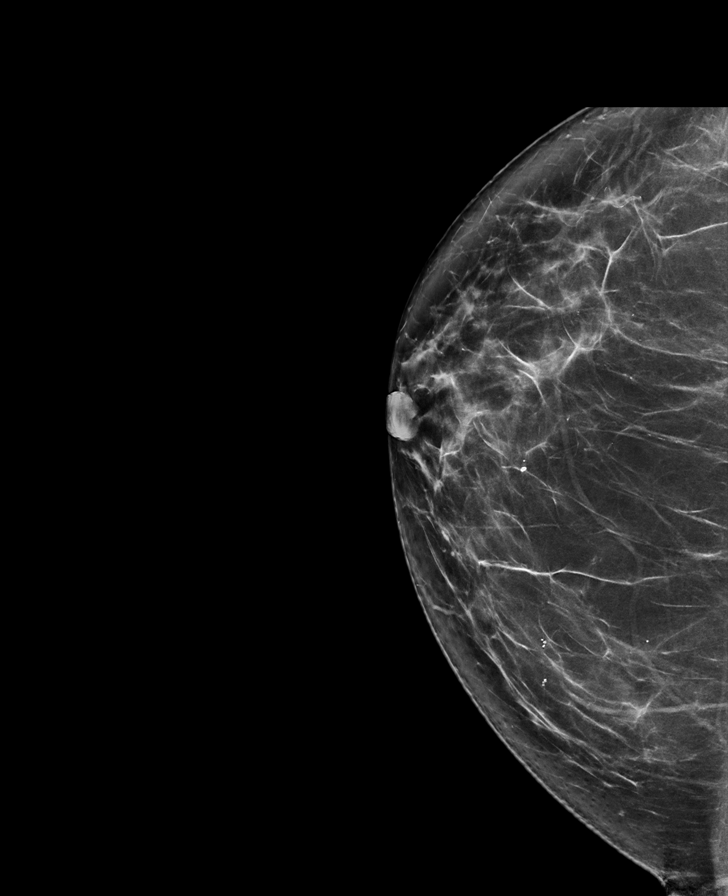

[R MLO synth-2D (1 of 2)]
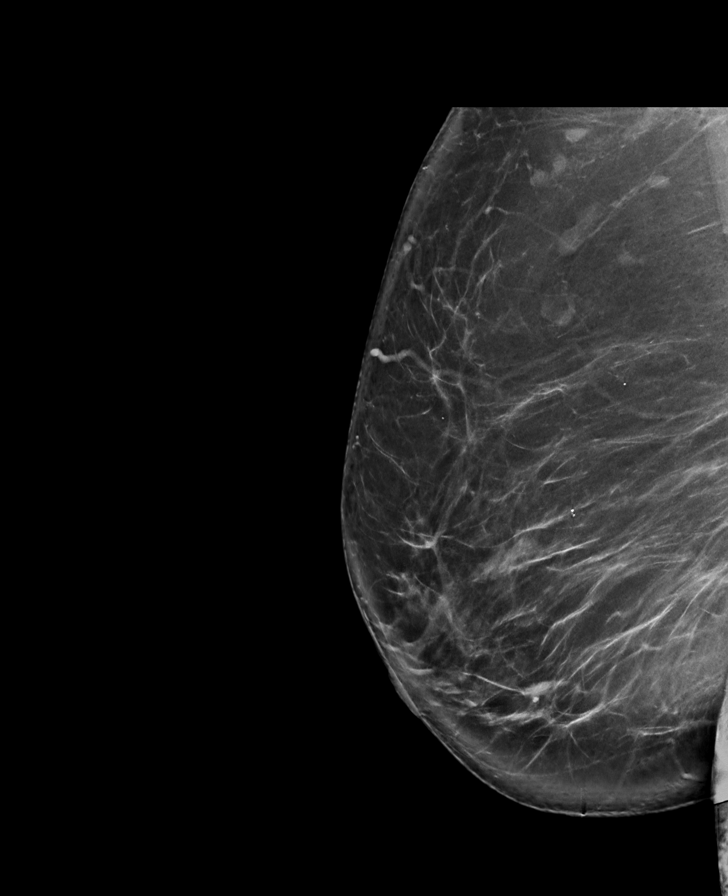

[R MLO synth-2D (2 of 2)]
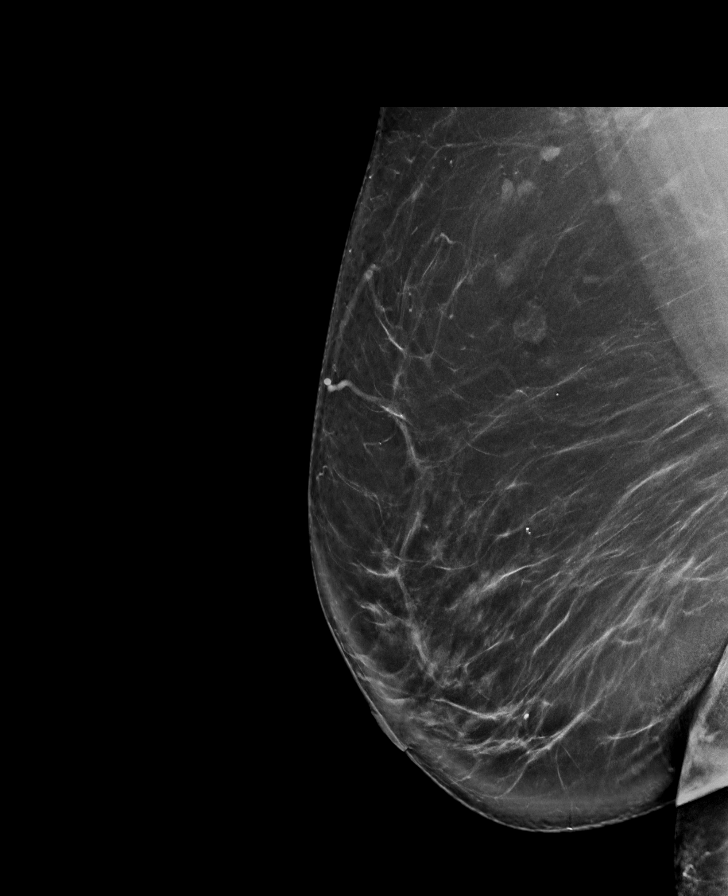

[L MLO synth-2D (2 of 2)]
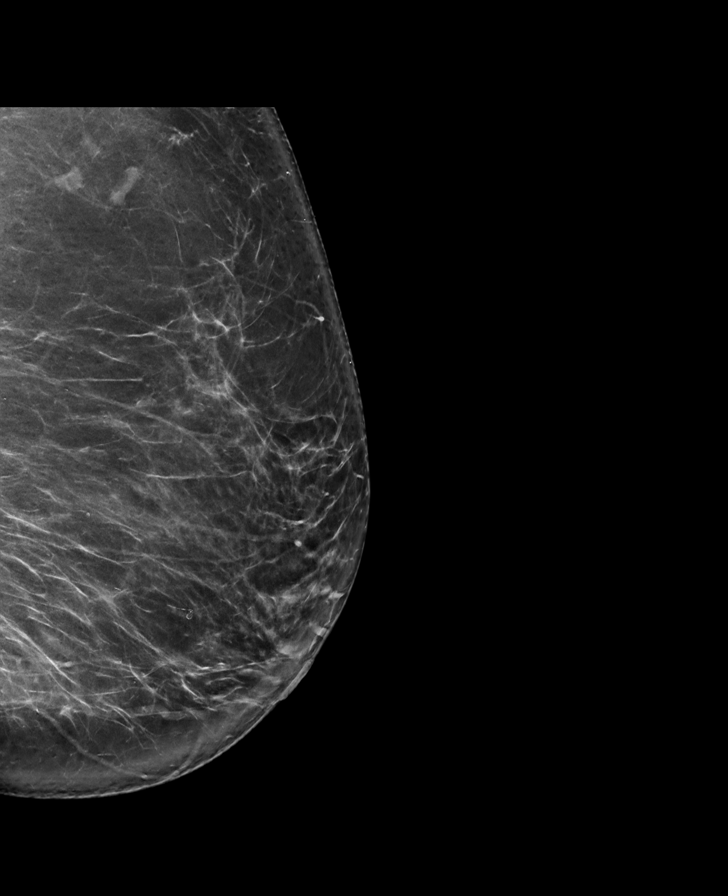

[L CC synth-2D]
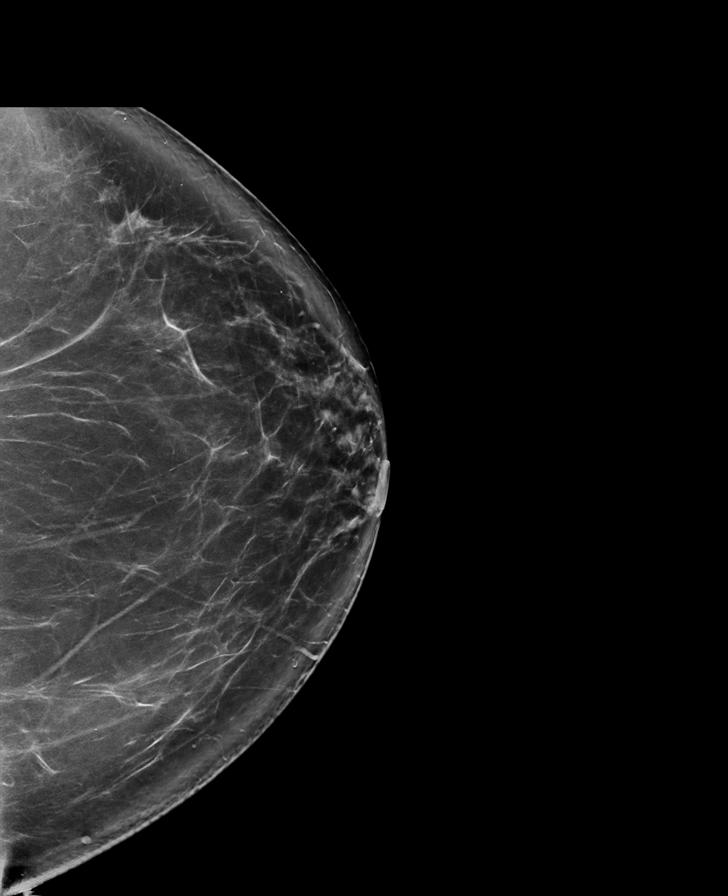

[R MLO tomo · tomo slice 55/110.0]
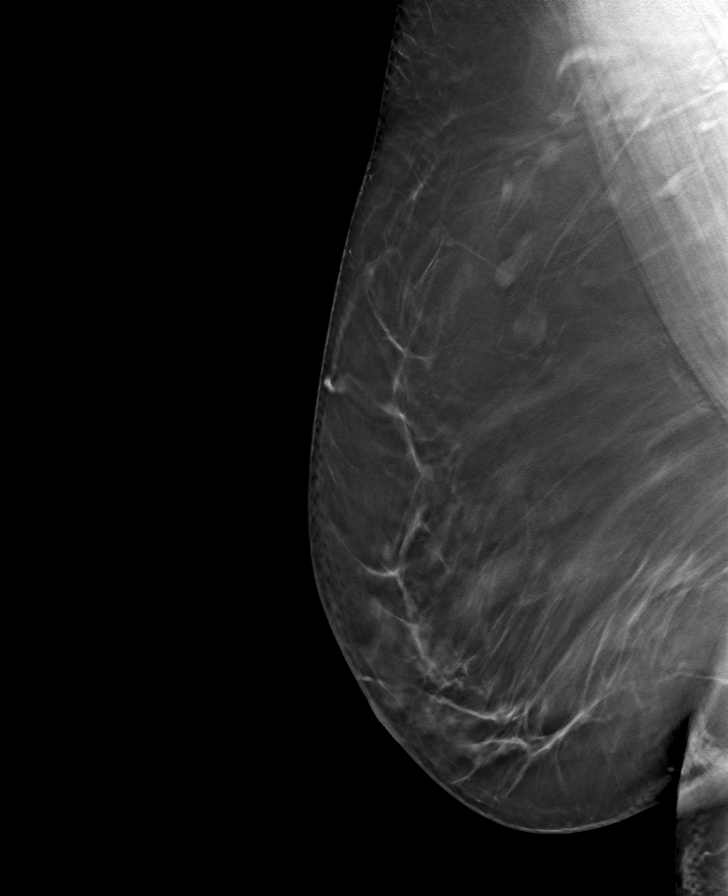

[8 of 40 positions shown; findings below may reference images not displayed]

ACR Breast Density Category b: There are scattered areas of
fibroglandular density.
FINDINGS: There are no findings suspicious for malignancy. Images were
processed with CAD.
IMPRESSION: No mammographic evidence of malignancy. A result letter of this
screening mammogram will be mailed directly to the patient.

RECOMMENDATION:
Screening mammogram in one year. (Code:[id number])

BI-RADS CATEGORY  1: Negative.

## 2020-10-26 ENCOUNTER — Telehealth: Payer: Self-pay | Admitting: Neurology

## 2020-10-26 NOTE — Telephone Encounter (Signed)
I called pt.  Leaking hose in back of machine,  She had not called her DME [facility].  I gave her the # to call and relayed that last order 05-2020 should be good to cover supplies.  She verbalized understanding.

## 2020-10-26 NOTE — Telephone Encounter (Signed)
Pt. states CPAP hose is not connecting in the back of machine & is leaking air. She states she hasn't been able to use it. Please advise.

## 2020-11-03 ENCOUNTER — Ambulatory Visit: Payer: Medicare Other | Admitting: Family Medicine

## 2020-12-06 ENCOUNTER — Other Ambulatory Visit: Payer: Self-pay | Admitting: Gastroenterology

## 2020-12-06 DIAGNOSIS — R1084 Generalized abdominal pain: Secondary | ICD-10-CM | POA: Diagnosis not present

## 2020-12-06 DIAGNOSIS — K219 Gastro-esophageal reflux disease without esophagitis: Secondary | ICD-10-CM | POA: Diagnosis not present

## 2020-12-06 DIAGNOSIS — Z86010 Personal history of colonic polyps: Secondary | ICD-10-CM | POA: Diagnosis not present

## 2020-12-06 DIAGNOSIS — D3A092 Benign carcinoid tumor of the stomach: Secondary | ICD-10-CM | POA: Diagnosis not present

## 2020-12-06 DIAGNOSIS — K5900 Constipation, unspecified: Secondary | ICD-10-CM | POA: Diagnosis not present

## 2020-12-12 IMAGING — DX DG CHEST 2V
4 series · 4 of 4 positions shown · non-contrast
Comparison: 03/30/2019

CLINICAL DATA: Fall with chest pain

EXAM:
CHEST - 2 VIEW

[view not recorded (1 of 3)]
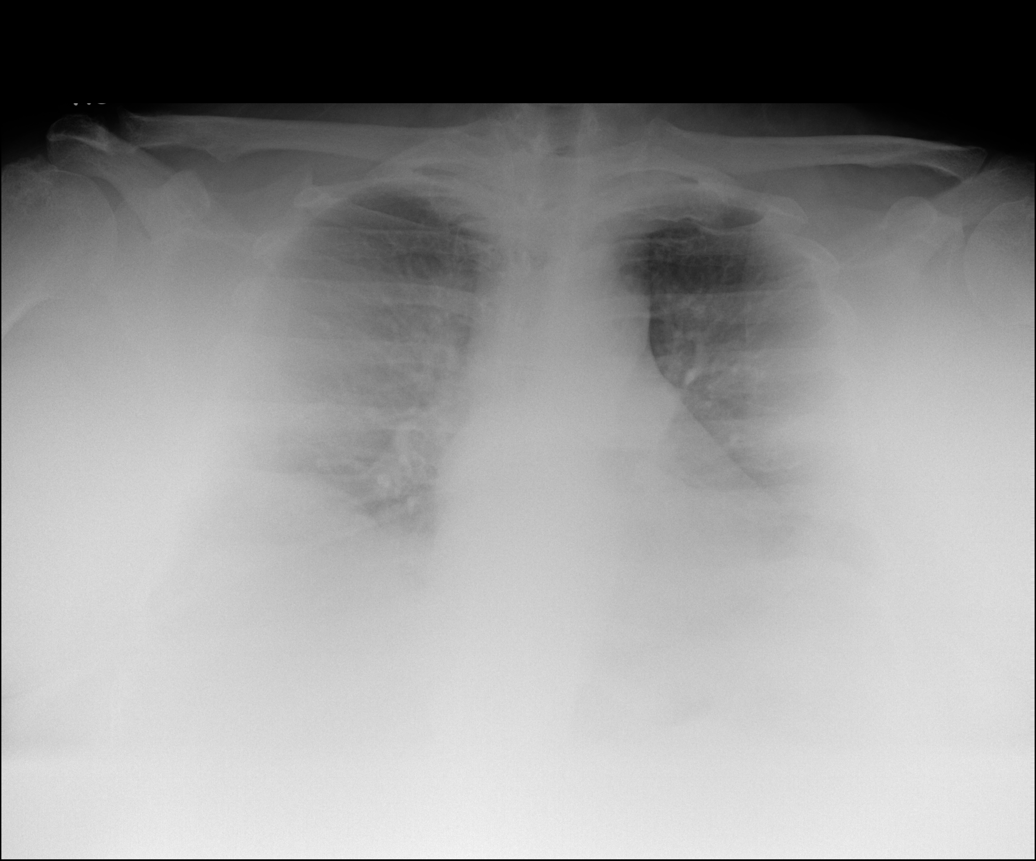

[view not recorded (2 of 3)]
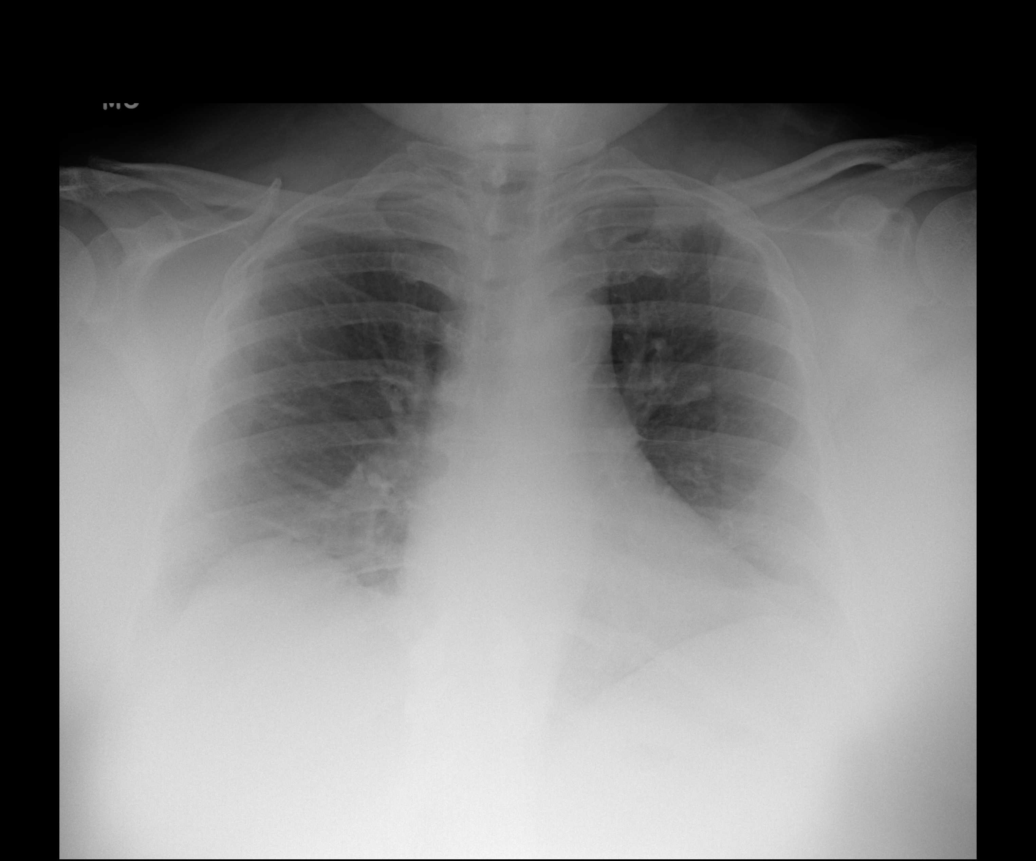

[view not recorded (3 of 3)]
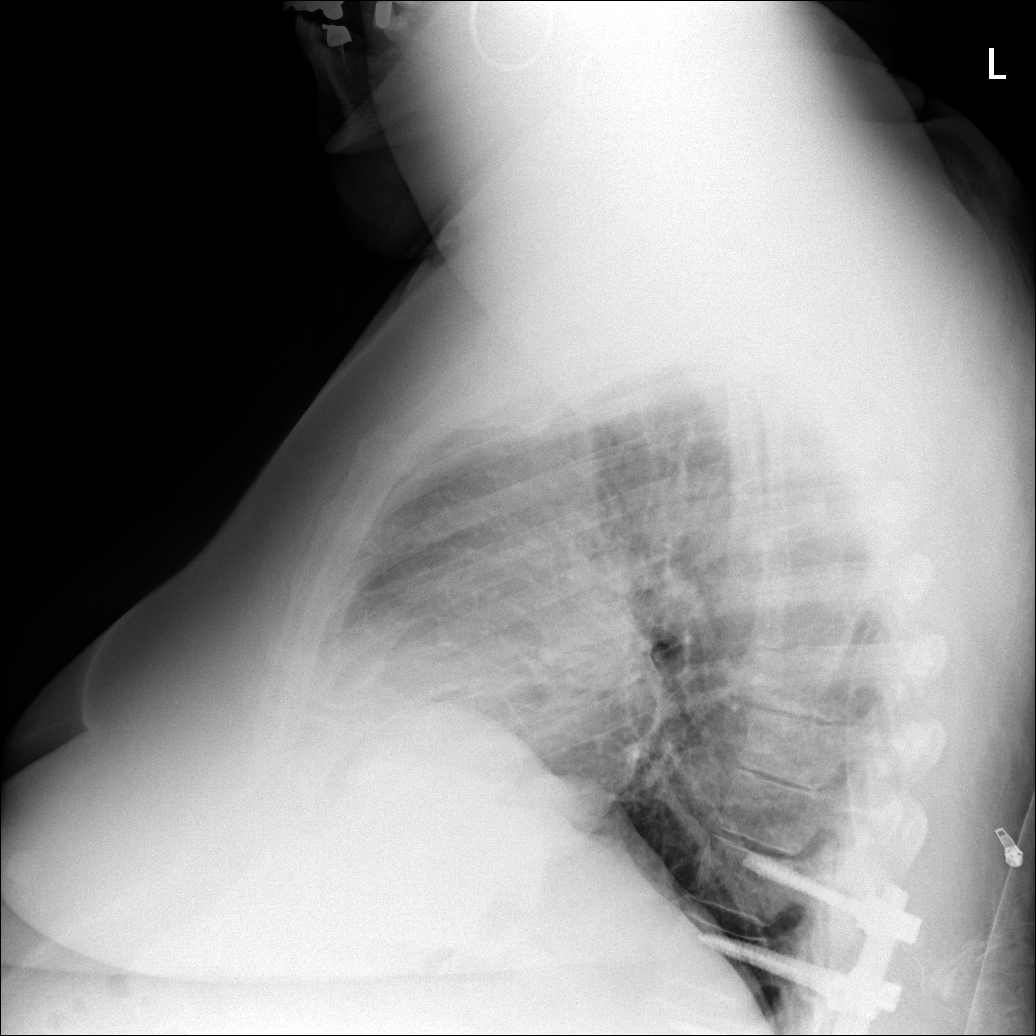

[dg chest 2 view]
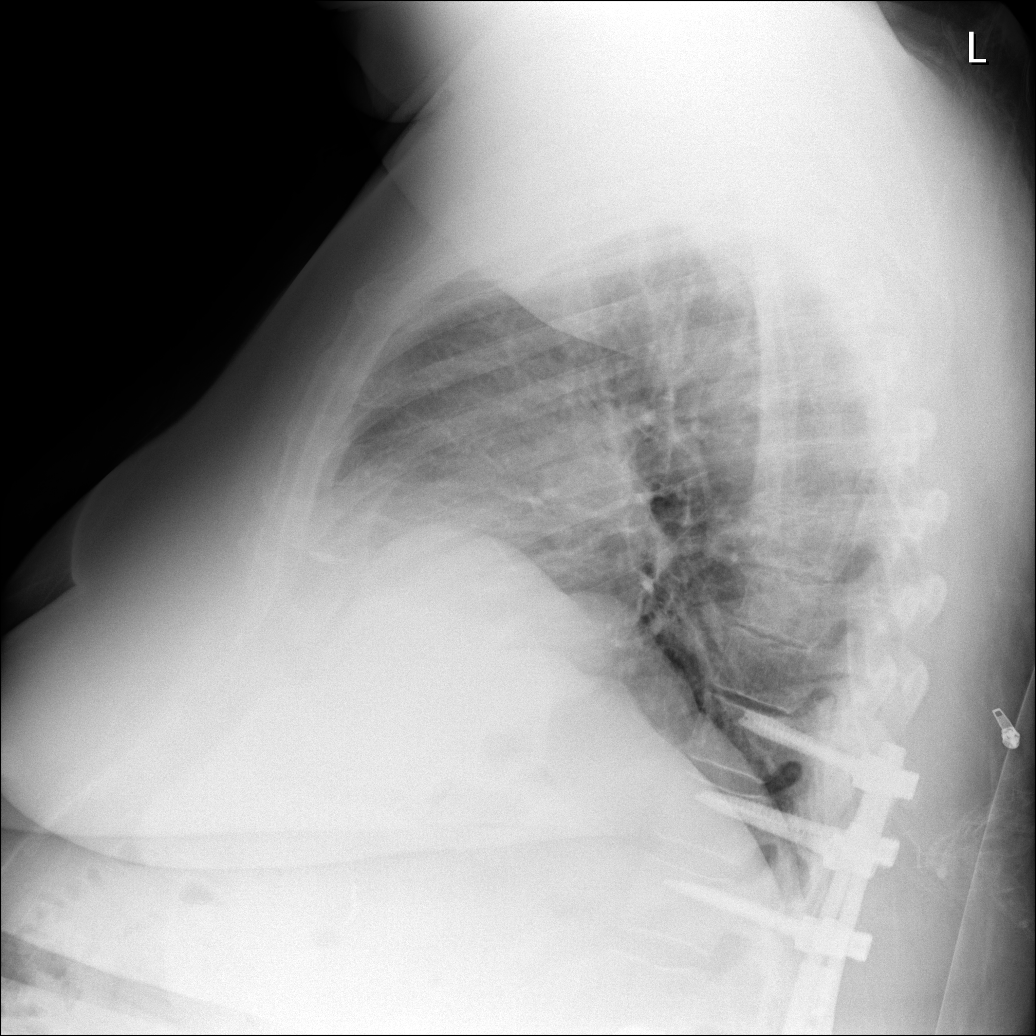

[4 of 4 positions shown; findings below may reference images not displayed]

FINDINGS: The heart size and mediastinal contours are within normal limits.
Both lungs are clear. Limited by habitus and technique. Incompletely
visualized surgical rods and fixating screws in the lower spine.
IMPRESSION: No active cardiopulmonary disease.

## 2020-12-14 DIAGNOSIS — E7800 Pure hypercholesterolemia, unspecified: Secondary | ICD-10-CM | POA: Diagnosis not present

## 2020-12-16 IMAGING — CT CT HEAD W/O CM
1 series · 16 of 30 positions shown, 20 images · non-contrast
Comparison: 09/24/2019

CLINICAL DATA: Fall with trauma to the left side of the head,
headache and dizziness.

EXAM:
CT HEAD WITHOUT CONTRAST
TECHNIQUE: Contiguous axial images were obtained from the base of the skull
through the vertex without intravenous contrast.

[Series 2: head w/(date) · axial · 0.45mm/px · z∈[-138,+2]mm · 16 of 32 slices shown, 20 images]
[im 2/32  brain]
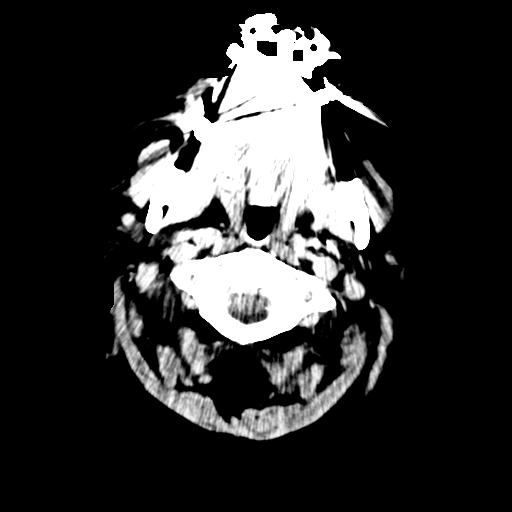
[im 2/32  bone]
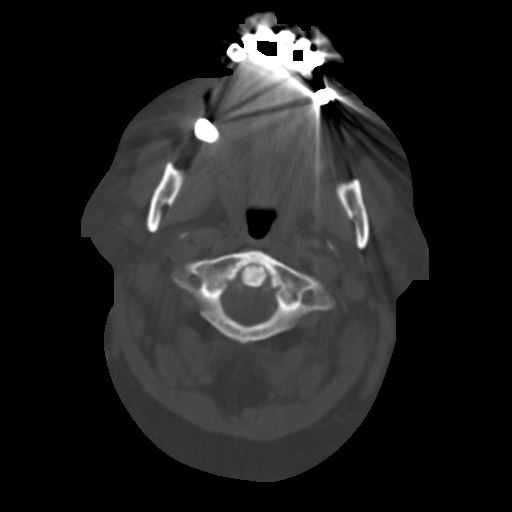
[im 4/32  brain]
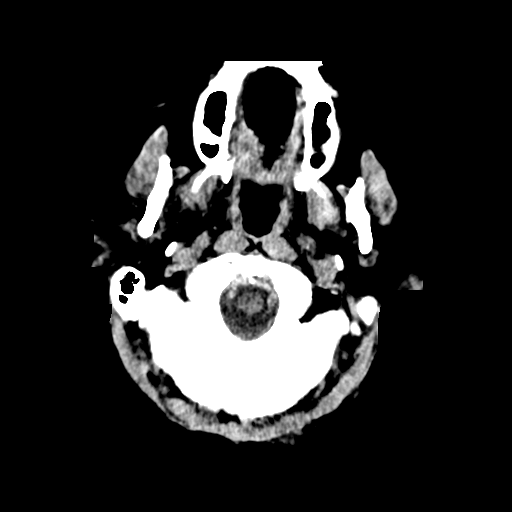
[im 6/32  brain]
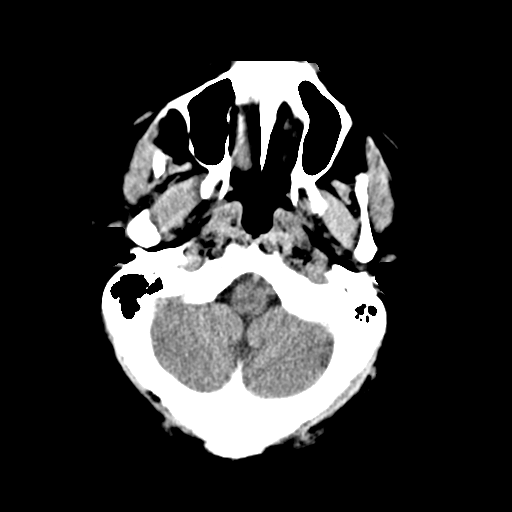
[im 8/32  brain]
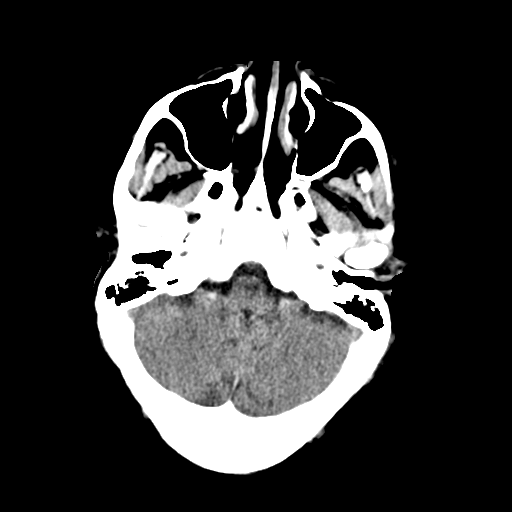
[im 9/32  brain]
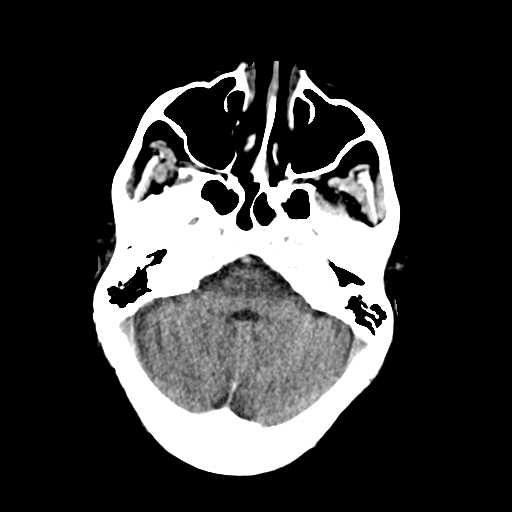
[im 9/32  bone]
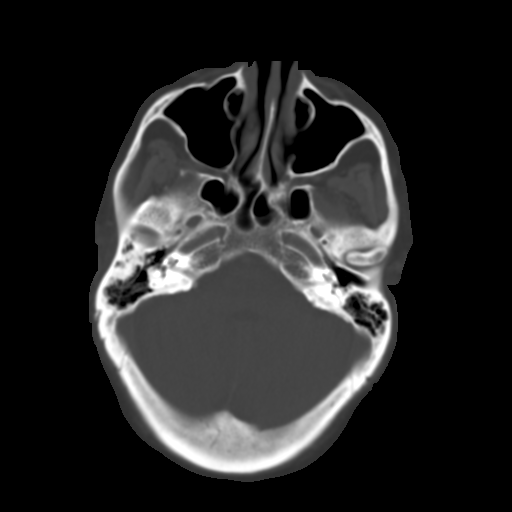
[im 11/32  brain]
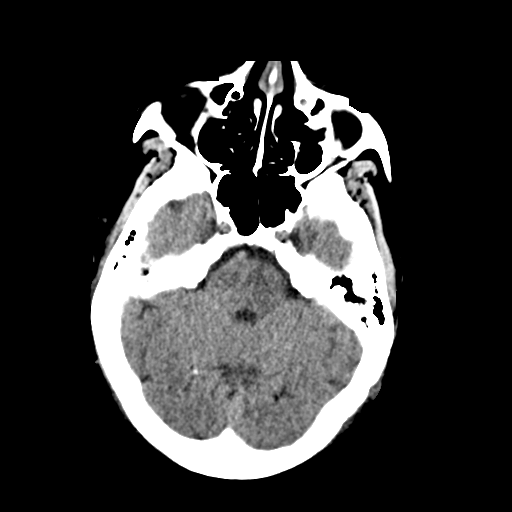
[im 13/32  brain]
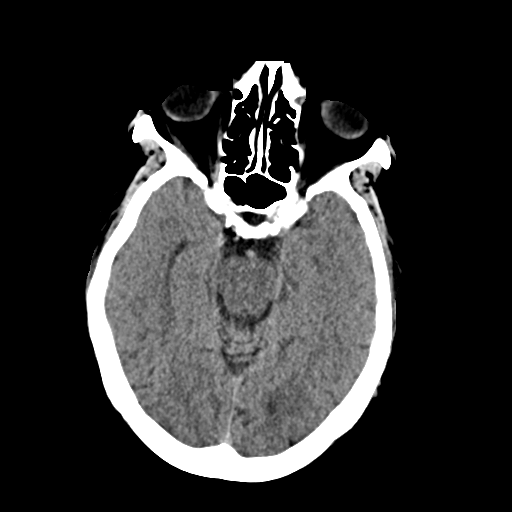
[im 15/32  brain]
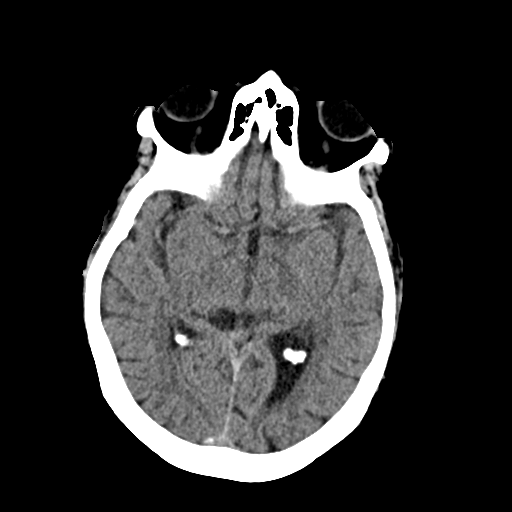
[im 17/32  brain]
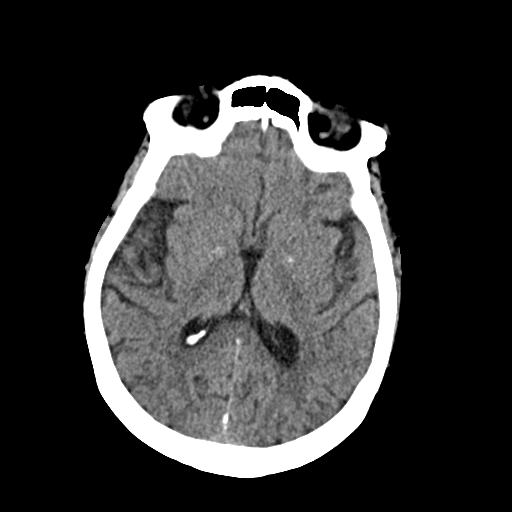
[im 17/32  bone]
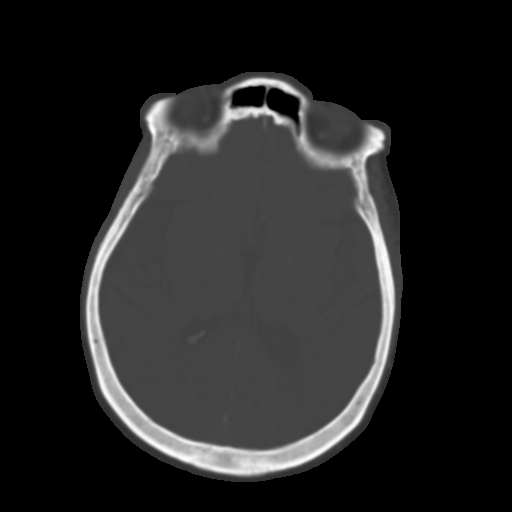
[im 19/32  brain]
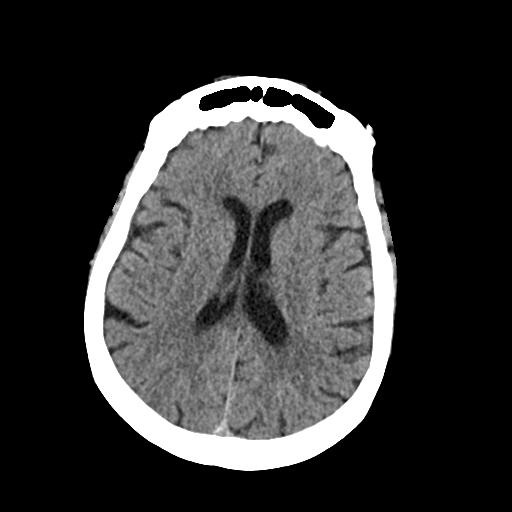
[im 21/32  brain]
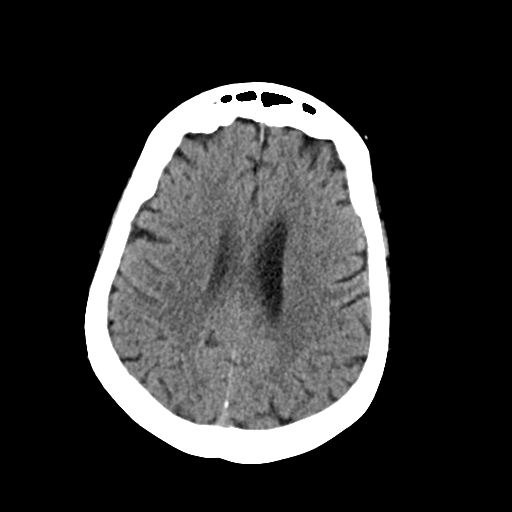
[im 23/32  brain]
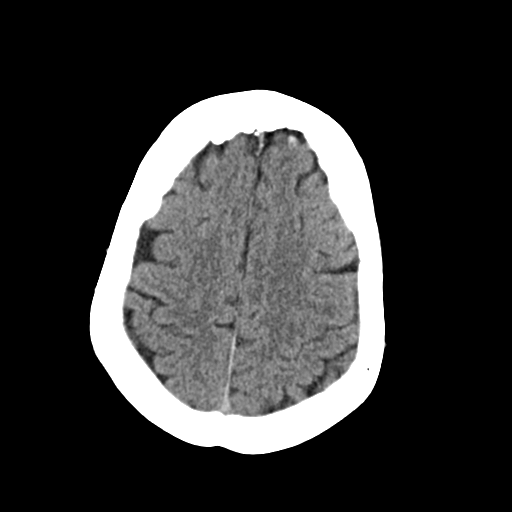
[im 24/32  brain]
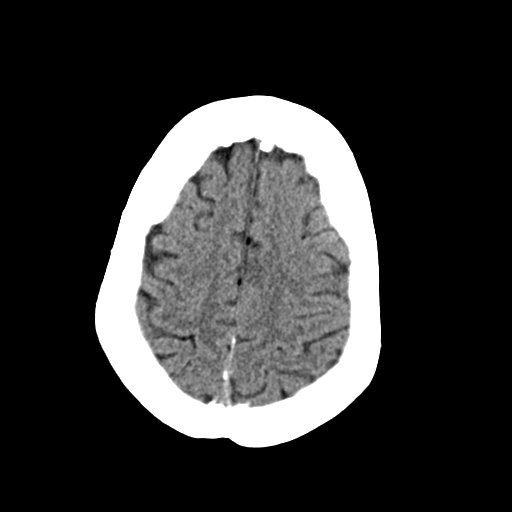
[im 24/32  bone]
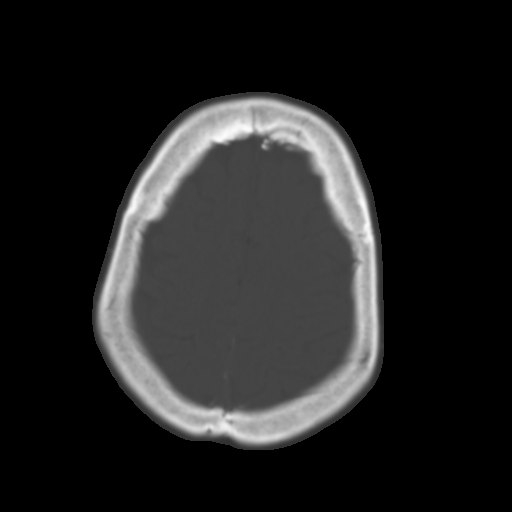
[im 26/32  brain]
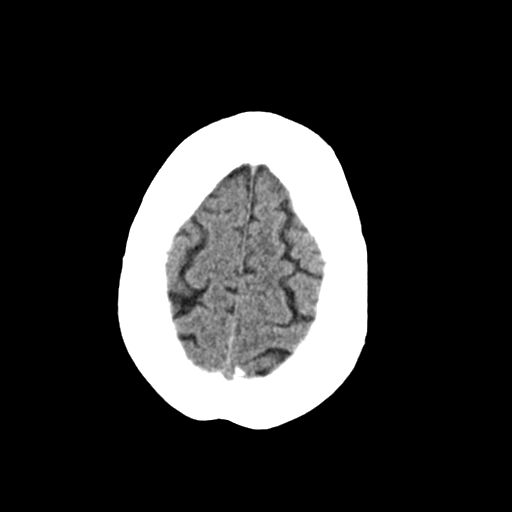
[im 28/32  brain]
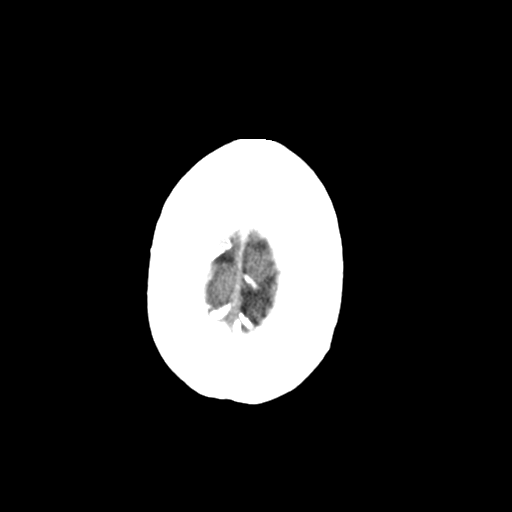
[im 30/32  brain]
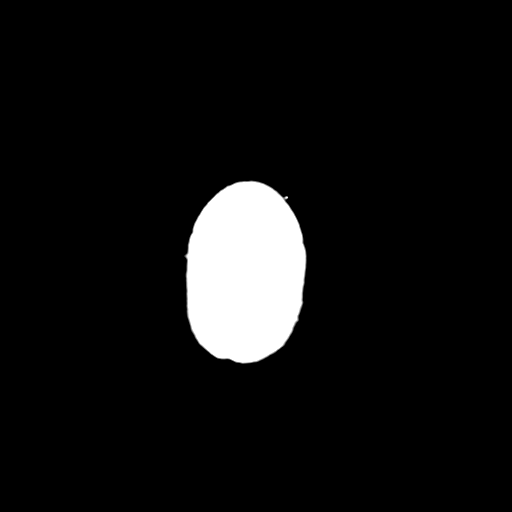

[16 of 30 positions shown; findings below may reference images not displayed]

FINDINGS: Brain: Mild age related volume loss. Mild chronic small-vessel
ischemic change of the white matter and basal ganglia. No sign of
acute infarction, mass lesion, hemorrhage, hydrocephalus or
extra-axial collection.

Vascular: There is atherosclerotic calcification of the major
vessels at the base of the brain.

Skull: Negative

Sinuses/Orbits: Clear/normal.  Previous lens implants.

Other: None
IMPRESSION: No acute or traumatic finding. Mild age related volume loss and
chronic small-vessel ischemic change of the white matter and basal
ganglia.

## 2020-12-21 ENCOUNTER — Other Ambulatory Visit: Payer: Self-pay

## 2020-12-21 ENCOUNTER — Ambulatory Visit
Admission: RE | Admit: 2020-12-21 | Discharge: 2020-12-21 | Disposition: A | Discharge status: Home / Self Care | Payer: Medicare Other | Source: Ambulatory Visit | Attending: Gastroenterology | Admitting: Gastroenterology

## 2020-12-21 DIAGNOSIS — D3502 Benign neoplasm of left adrenal gland: Secondary | ICD-10-CM | POA: Diagnosis not present

## 2020-12-21 DIAGNOSIS — R1084 Generalized abdominal pain: Secondary | ICD-10-CM

## 2020-12-21 DIAGNOSIS — E278 Other specified disorders of adrenal gland: Secondary | ICD-10-CM | POA: Diagnosis not present

## 2020-12-21 DIAGNOSIS — K760 Fatty (change of) liver, not elsewhere classified: Secondary | ICD-10-CM | POA: Diagnosis not present

## 2020-12-21 DIAGNOSIS — G8929 Other chronic pain: Secondary | ICD-10-CM | POA: Diagnosis not present

## 2020-12-21 MED ORDER — IOPAMIDOL (ISOVUE-300) INJECTION 61%
100.0000 mL | Freq: Once | INTRAVENOUS | Status: AC | PRN
  Administered 2020-12-21: 100 mL via INTRAVENOUS

## 2021-01-11 DIAGNOSIS — N760 Acute vaginitis: Secondary | ICD-10-CM | POA: Diagnosis not present

## 2021-01-11 DIAGNOSIS — F339 Major depressive disorder, recurrent, unspecified: Secondary | ICD-10-CM | POA: Diagnosis not present

## 2021-01-11 DIAGNOSIS — J309 Allergic rhinitis, unspecified: Secondary | ICD-10-CM | POA: Diagnosis not present

## 2021-01-11 DIAGNOSIS — R079 Chest pain, unspecified: Secondary | ICD-10-CM | POA: Diagnosis not present

## 2021-01-16 ENCOUNTER — Encounter: Payer: Self-pay | Admitting: Cardiology

## 2021-01-16 ENCOUNTER — Other Ambulatory Visit: Payer: Self-pay

## 2021-01-16 ENCOUNTER — Ambulatory Visit (INDEPENDENT_AMBULATORY_CARE_PROVIDER_SITE_OTHER): Payer: Medicare Other | Admitting: Cardiology

## 2021-01-16 VITALS — BP 120/60 | HR 75 | Ht 65.5 in | Wt 299.0 lb

## 2021-01-16 DIAGNOSIS — R079 Chest pain, unspecified: Secondary | ICD-10-CM | POA: Diagnosis not present

## 2021-01-16 DIAGNOSIS — R0600 Dyspnea, unspecified: Secondary | ICD-10-CM

## 2021-01-16 DIAGNOSIS — R0609 Other forms of dyspnea: Secondary | ICD-10-CM

## 2021-01-16 DIAGNOSIS — R072 Precordial pain: Secondary | ICD-10-CM | POA: Diagnosis not present

## 2021-01-16 DIAGNOSIS — I10 Essential (primary) hypertension: Secondary | ICD-10-CM

## 2021-01-16 MED ORDER — ATORVASTATIN CALCIUM 20 MG PO TABS: 20.0000 mg | ORAL_TABLET | Freq: Every day | ORAL | 3 refills | Status: AC

## 2021-01-16 MED ORDER — FUROSEMIDE 20 MG PO TABS: 20.0000 mg | ORAL_TABLET | Freq: Every day | ORAL | 3 refills | Status: DC

## 2021-01-16 NOTE — Patient Instructions (Addendum)
Medication Instructions:   Your physician has recommended you make the following change in your medication:   1.  START taking Lasix 20 MG once a day.  *If you need a refill on your cardiac medications before your next appointment, please call your pharmacy*   Lab Work:  None ordered  Testing/Procedures:  1.  Your physician has requested that you have an echocardiogram. Echocardiography is a painless test that uses sound waves to create images of your heart. It provides your doctor with information about the size and shape of your heart and how well your heart's chambers and valves are working. This procedure takes approximately one hour. There are no restrictions for this procedure.  2.  [place]      Your caregiver has ordered a Stress Test with nuclear imaging. The purpose of this test is to evaluate the blood supply to your heart muscle. This procedure is referred to as a "Non-Invasive Stress Test." This is because other than having an IV started in your vein, nothing is inserted or "invades" your body. Cardiac stress tests are done to find areas of poor blood flow to the heart by determining the extent of coronary artery disease (CAD). Some patients exercise on a treadmill, which naturally increases the blood flow to your heart, while others who are  unable to walk on a treadmill due to physical limitations have a pharmacologic/chemical stress agent called Lexiscan . This medicine will mimic walking on a treadmill by temporarily increasing your coronary blood flow.      PLEASE REPORT TO [facility] MEDICAL MALL ENTRANCE   THE VOLUNTEERS AT THE FIRST DESK WILL DIRECT YOU WHERE TO GO     Date of Procedure:_____________________________________   Arrival Time for Procedure:______________________________    PLEASE NOTIFY THE OFFICE AT LEAST 24 HOURS IN ADVANCE IF YOU ARE UNABLE TO KEEP YOUR APPOINTMENT.  [place] 24 HOURS IN ADVANCE  IF YOU ARE UNABLE TO KEEP YOUR APPOINTMENT. [phone]      How to prepare for your Myoview test:         ____:  Hold diabetes medication the morning of procedure:   __XX__:  Hold betablocker(s) night before procedure and morning of procedure:   metoprolol (LOPRESSOR)    __XX__:  Hold other medications as follows: hydrochlorothiazide (HYDRODIURIL)   1. Do not eat or drink after midnight  2. No caffeine for 24 hours prior to test  3. No smoking 24 hours prior to test.  4. Unless instructed otherwise, Take your medication with a small sips of water.    5.         Ladies, please do not wear dresses. Skirts or pants are appropriate. Please wear a short sleeve shirt.  6. No perfume, cologne or lotion.  7. Wear comfortable walking shoes. No heels!       Follow-Up: At [facility], you and your health needs are our priority.  As part of our continuing mission to provide you with exceptional heart care, we have created designated Provider Care Teams.  These Care Teams include your primary Cardiologist (physician) and Advanced Practice Providers (APPs -  Physician Assistants and Nurse Practitioners) who all work together to provide you with the care you need, when you need it.  We recommend signing up for the patient portal called "MyChart".  Sign up information is provided on this After Visit Summary.  MyChart is used to connect with patients for Virtual Visits (Telemedicine).  Patients are able to view lab/test results, encounter notes, upcoming appointments, etc.  Non-urgent messages can be sent to your provider as well.   To learn more about what you can do with MyChart, go to [email or web address].    Your next appointment:   Follow up after Testing   The format for your next appointment:   In Person  Provider:   Kate Sable, MD   Other Instructions

## 2021-01-16 NOTE — Progress Notes (Signed)
Cardiology Office Note:    Date:  01/16/2021   ID:  Amy [name], DOB [birth year], MRN [id number]  PCP:  Kelton Pillar, MD   [place]  Cardiologist:  Kate Sable, MD  Advanced Practice Provider:  No care team member to display Electrophysiologist:  None       Referring MD: Kelton Pillar, MD   Chief Complaint  Patient presents with  . New Patient (Initial Visit)    Referred by PCP for atypical chest pain, SOB since late last year. Patient c.o swelling in ankles. Meds reviewed verbally with patient.     History of Present Illness:    Amy [name] is a 75 y.o. female with a hx of anxiety, hypertension, hyperlipidemia, OSA on CPAP, obesity, who presents due to chest pain and shortness of breath.  Patient states having symptoms of shortness of breath for over a year now.  Symptoms seem to have worsened over the last several months.  Symptoms are sometimes associated with exertion.  She also has chest pain which occasionally occurs at rest.  Was previously placed on Lasix due to lower extremity edema, but this was never refilled.  Lower extremity edema worsens as the day progresses if and when she wakes up sometimes with edema.  Legs are also tender.  Echo 06/2016 showed normal systolic function, EF 60 to 65%.  Impaired relaxation..  Past Medical History:  Diagnosis Date  . Abdominal pain of unknown etiology 02/05/2016  . Anxiety   . Arthritis    ddd- RA  . Asthma    "sleeping asthma"  . Chronic back pain    lumbar steroid injection's  . Complication of anesthesia    Hard to wake up. Pt sts is took 3 days.  . Constipation   . Depression   . Dysphonia    intermittent "voice changes"  . Esophageal dysmotility   . Fibromyalgia   . Fibromyalgia   . GERD (gastroesophageal reflux disease)   . Glaucoma   . Headache   . High cholesterol    ? pt states her doctor told her to continue eating vegetables & fruit  . History  of DVT (deep vein thrombosis) 1989   LEFT UPPER ARM  . History of hiatal hernia   . History of MRSA infection 2011  . Hypertension   . Irregular heart rate    "years ago"  . Low iron   . Lumbago   . Mild obstructive sleep apnea    per study 02-07-2006 - no cpap  . Neuropathy    feet  . Pelvic pain in female   . Peripheral vascular disease ([place])    "poor circulation"  . SBO (small bowel obstruction) ([place]) 06/2016  . Seasonal allergies   . Short of breath on exertion   . Urinary frequency   . Vaginal atrophy   . Weakness    both hands and feet    Past Surgical History:  Procedure Laterality Date  . abdominal adhesions removed    . ABDOMINAL HYSTERECTOMY  1978  . BACK SURGERY    . BUNIONECTOMY Left 2008  . CATARACT EXTRACTION Bilateral 2017  . COLON SURGERY    . COLONOSCOPY    . CYSTO WITH HYDRODISTENSION N/A 07/12/2015   Procedure: CYSTOSCOPY/HYDRODISTENSION;  Surgeon: Bjorn Loser, MD;  [facility];  Service: Urology;  Laterality: N/A;  . DILATION AND CURETTAGE OF UTERUS    . ESOPHAGEAL MANOMETRY N/A 11/22/2014   Procedure: ESOPHAGEAL MANOMETRY (  EM);  Surgeon: Winfield Cunas., MD;  Location: [facility];  Service: Endoscopy;  Laterality: N/A;  . ESOPHAGOGASTRODUODENOSCOPY N/A 07/15/2013   Procedure: ESOPHAGOGASTRODUODENOSCOPY (EGD);  Surgeon: Winfield Cunas., MD;  Location: [facility];  Service: Endoscopy;  Laterality: N/A;  need xray  . EXPLORATORY LAPAROTOMY    . EYE SURGERY Bilateral    cataract surgery with lens implants  . HARDWARE REMOVAL N/A 01/02/2016   Procedure: Exploration of Lumbar Fusion,Removal of hardware Lumbar One-Two ;Redo Posterior Lumbar Fusion Lumbar One-Two;  Surgeon: Kary Kos, MD;  Location: [facility];  Service: Neurosurgery;  Laterality: N/A;  . ingrown toenail removal    . LAPAROSCOPIC CHOLECYSTECTOMY  01-11-2006  . LAPAROSCOPY N/A 07/06/2016   Procedure: LAPAROSCOPY DIAGNOSTIC EMERGENT TO OPEN;   Surgeon: Greer Pickerel, MD;  Location: [facility];  Service: General;  Laterality: N/A;  . LAPAROTOMY N/A 07/06/2016   Procedure: EXPLORATORY LAPAROTOMY LYSIS OF ADHESIONS FOR 3 HOURS;  Surgeon: Greer Pickerel, MD;  Location: [facility];  Service: General;  Laterality: N/A;  . lipoma removal    . LUMBAR FUSION  2014   L4 -- L5  . MASS EXCISION Left 02/20/2013   Procedure: EXCISION LEFT BUTTOCK  MASS;  Surgeon: Adin Hector, MD;  Location: [facility];  Service: General;  Laterality: Left;  . NOSE SURGERY  2007  . REMOVAL HARDWARE L4-L5/  BILATERAL LAMINECTOMY L2 - L5 AND FUSION  06-12-2011  . SHOULDER OPEN ROTATOR CUFF REPAIR Left 05/05/2014   Procedure: OPEN ACROMIONECTOMY AND OPEN REPAIR OF ROTATOR CUFF, TISSUEMEND GRAFT WITH ANCHOR ;  Surgeon: Tobi Bastos, MD;  Location: [facility];  Service: Orthopedics;  Laterality: Left;  . SPINE SURGERY     x6  . TONSILLECTOMY      Current Medications: Current Meds  Medication Sig  . acetaminophen (TYLENOL) 500 MG tablet Take 1,000 mg by mouth every 6 (six) hours as needed for moderate pain.   Marland Kitchen atorvastatin (LIPITOR) 20 MG tablet Take 1 tablet (20 mg total) by mouth daily.  . B Complex Vitamins (B COMPLEX PO) Take by mouth.  . cloNIDine (CATAPRES) 0.1 MG tablet Take 1 tablet (0.1 mg total) by mouth 3 (three) times daily.  . diclofenac sodium (VOLTAREN) 1 % GEL Apply 4 g topically 4 (four) times daily.  . diphenhydrAMINE (BENADRYL) 25 MG tablet Take 25 mg by mouth 2 (two) times daily as needed for allergies.  Marland Kitchen estradiol (ESTRACE) 0.1 MG/GM vaginal cream Place 1 Applicatorful vaginally 2 (two) times a week.  . Fluoxetine HCl, PMDD, 10 MG TABS Take by mouth.  . fluticasone (FLONASE) 50 MCG/ACT nasal spray Place 1 spray into both nostrils daily.  . furosemide (LASIX) 20 MG tablet Take 1 tablet (20 mg total) by mouth daily.  Marland Kitchen gabapentin (NEURONTIN) 600 MG tablet Take 600 mg by mouth 2 (two) times daily.  . hydrALAZINE (APRESOLINE) 50 MG tablet Take 50 mg by mouth 3  (three) times daily.  . hydrochlorothiazide (HYDRODIURIL) 12.5 MG tablet Take 12.5 mg by mouth daily.  Marland Kitchen losartan (COZAAR) 100 MG tablet TK 1 T PO ONCE D  . meloxicam (MOBIC) 7.5 MG tablet Take 1 tablet (7.5 mg total) by mouth daily.  . metoprolol (LOPRESSOR) 50 MG tablet Take 1 tablet (50 mg total) by mouth 2 (two) times daily.  Marland Kitchen NIFEdipine (PROCARDIA) 10 MG capsule Take 10 mg by mouth 3 (three) times daily.  Marland Kitchen OVER THE COUNTER MEDICATION Take 2 tablets by mouth daily. Swiss Kriss OTC  herbal laxative  . pantoprazole (PROTONIX) 40 MG tablet Take 80 mg by mouth daily.   Marland Kitchen PARoxetine (PAXIL) 30 MG tablet Take 30 mg by mouth daily.  . potassium chloride SA (K-DUR,KLOR-CON) 20 MEQ tablet Take 20 mEq by mouth daily.  Marland Kitchen triamcinolone ointment (KENALOG) 0.5 % Apply 1 application topically 2 (two) times daily. To rash to feet  . vitamin E 180 MG (400 UNITS) capsule Take 400 Units by mouth daily.     Allergies:   Latex, Crestor [rosuvastatin calcium], Penicillins, Tetanus toxoids, and Tomato   Social History   Socioeconomic History  . Marital status: Married    Spouse name: Not on file  . Number of children: 1  . Years of education: Not on file  . Highest education level: Master's degree (e.g., MA, MS, MEng, MEd, MSW, MBA)  Occupational History  . Not on file  Tobacco Use  . Smoking status: Former Smoker    Quit date: 07/10/1969    Years since quitting: 51.5  . Smokeless tobacco: Never Used  Vaping Use  . Vaping Use: Never used  Substance and Sexual Activity  . Alcohol use: Not Currently    Comment: rare, social  . Drug use: No  . Sexual activity: Not on file  Other Topics Concern  . Not on file  Social History Narrative   Lives at home with husband    Right handed   Caffeine: max 2 cups coffee per day but not does not drink daily.   Social Determinants of Health   Financial Resource Strain: Not on file  Food Insecurity: Not on file  Transportation Needs: Not on file   Physical Activity: Not on file  Stress: Not on file  Social Connections: Not on file     Family History: The patient's family history includes Aneurysm in her maternal grandmother; Cancer in her daughter, mother, and sister; Colon cancer in her father; Colon polyps in her father; Diabetes in her mother; Hypertension in her brother, brother, father, mother, and sister; Kidney disease in her brother and mother; Liver cancer in her brother and brother; Osteoarthritis in her mother. There is no history of Migraines or Headache.  ROS:   Please see the history of present illness.     All other systems reviewed and are negative.  EKGs/Labs/Other Studies Reviewed:    The following studies were reviewed today:   EKG:  EKG is  ordered today.  The ekg ordered today demonstrates normal sinus rhythm, normal ECG  Recent Labs: 07/28/2020: ALT 15; BUN 12; Creatinine, Ser 0.98; Hemoglobin 13.9; Platelets 325; Potassium 4.4; Sodium 141; TSH 1.770  Recent Lipid Panel    Component Value Date/Time   CHOL 165 07/03/2016 0627   TRIG 148 07/27/2016 0415   HDL 82 07/03/2016 0627   CHOLHDL 2.0 07/03/2016 0627   VLDL 8 07/03/2016 0627   LDLCALC 75 07/03/2016 0627     Risk Assessment/Calculations:      Physical Exam:    VS:  BP 120/60 (BP Location: Right Arm, Patient Position: Sitting, Cuff Size: Normal)   Pulse 75   Ht 5' 5.5" (1.664 m)   Wt 299 lb (135.6 kg)   SpO2 97%   BMI 49.00 kg/m     Wt Readings from Last 3 Encounters:  01/16/21 299 lb (135.6 kg)  07/28/20 293 lb (132.9 kg)  02/25/20 295 lb (133.8 kg)     GEN:  Well nourished, well developed in no acute distress HEENT: Normal NECK: No JVD; No  carotid bruits LYMPHATICS: No lymphadenopathy CARDIAC: RRR, no murmurs, rubs, gallops RESPIRATORY:  Clear to auscultation without rales, wheezing or rhonchi  ABDOMEN: Soft, non-tender, non-distended MUSCULOSKELETAL:  No edema; No deformity  SKIN: Warm and dry NEUROLOGIC:  Alert and  oriented x 3 PSYCHIATRIC:  Normal affect   ASSESSMENT:    1. Precordial pain   2. Dyspnea on exertion   3. Primary hypertension   4. Morbid obesity ([place])   5. Chest pain, unspecified type    PLAN:    In order of problems listed above:  1. Chest pain, risk factors hypertension, hyperlipidemia, and obesity.  Obtain echo and Lexiscan Myoview to evaluate ischemia. 2. Dyspnea on exertion, this could be an anginal equivalent due to risk factors.  Echo and Myoview as above.  Symptoms as normal, consider deconditioning/morbid obesity as possible etiology. 3. Hypertension, BP controlled. 4. Morbid obesity, low-calorie diet, weight loss advised. 5. Bilateral lower extremity edema, start Lasix 20 mg daily.  Diastolic dysfunction possible due to morbid obesity.  Follow-up after echo and Myoview.   Shared Decision Making/Informed Consent The risks [chest pain, shortness of breath, cardiac arrhythmias, dizziness, blood pressure fluctuations, myocardial infarction, stroke/transient ischemic attack, nausea, vomiting, allergic reaction, radiation exposure, metallic taste sensation and life-threatening complications (estimated to be 1 in 10,000)], benefits (risk stratification, diagnosing coronary artery disease, treatment guidance) and alternatives of a nuclear stress test were discussed in detail with Ms. Hoelzel and she agrees to proceed.    Medication Adjustments/Labs and Tests Ordered: Current medicines are reviewed at length with the patient today.  Concerns regarding medicines are outlined above.  Orders Placed This Encounter  Procedures  . NM Myocar Multi W/Spect W/Wall Motion / EF  . EKG 12-Lead  . ECHOCARDIOGRAM COMPLETE   Meds ordered this encounter  Medications  . furosemide (LASIX) 20 MG tablet    Sig: Take 1 tablet (20 mg total) by mouth daily.    Dispense:  30 tablet    Refill:  3  . atorvastatin (LIPITOR) 20 MG tablet    Sig: Take 1 tablet (20 mg total) by mouth  daily.    Dispense:  30 tablet    Refill:  3    Patient Instructions  Medication Instructions:   Your physician has recommended you make the following change in your medication:   1.  START taking Lasix 20 MG once a day.  *If you need a refill on your cardiac medications before your next appointment, please call your pharmacy*   Lab Work:  None ordered  Testing/Procedures:  1.  Your physician has requested that you have an echocardiogram. Echocardiography is a painless test that uses sound waves to create images of your heart. It provides your doctor with information about the size and shape of your heart and how well your heart's chambers and valves are working. This procedure takes approximately one hour. There are no restrictions for this procedure.  2.  [place]      Your caregiver has ordered a Stress Test with nuclear imaging. The purpose of this test is to evaluate the blood supply to your heart muscle. This procedure is referred to as a "Non-Invasive Stress Test." This is because other than having an IV started in your vein, nothing is inserted or "invades" your body. Cardiac stress tests are done to find areas of poor blood flow to the heart by determining the extent of coronary artery disease (CAD). Some patients exercise on a treadmill, which naturally increases the blood flow  to your heart, while others who are  unable to walk on a treadmill due to physical limitations have a pharmacologic/chemical stress agent called Lexiscan . This medicine will mimic walking on a treadmill by temporarily increasing your coronary blood flow.      PLEASE REPORT TO [facility] MEDICAL MALL ENTRANCE   THE VOLUNTEERS AT THE FIRST DESK WILL DIRECT YOU WHERE TO GO     Date of Procedure:_____________________________________   Arrival Time for Procedure:______________________________    PLEASE NOTIFY THE OFFICE AT LEAST 24 HOURS IN ADVANCE IF YOU ARE UNABLE TO KEEP YOUR APPOINTMENT.   [place] 24 HOURS IN ADVANCE IF YOU ARE UNABLE TO KEEP YOUR APPOINTMENT. [phone]      How to prepare for your Myoview test:         ____:  Hold diabetes medication the morning of procedure:   __XX__:  Hold betablocker(s) night before procedure and morning of procedure:   metoprolol (LOPRESSOR)    __XX__:  Hold other medications as follows: hydrochlorothiazide (HYDRODIURIL)   1. Do not eat or drink after midnight  2. No caffeine for 24 hours prior to test  3. No smoking 24 hours prior to test.  4. Unless instructed otherwise, Take your medication with a small sips of water.    5.         Ladies, please do not wear dresses. Skirts or pants are appropriate. Please wear a short sleeve shirt.  6. No perfume, cologne or lotion.  7. Wear comfortable walking shoes. No heels!       Follow-Up: At [facility], you and your health needs are our priority.  As part of our continuing mission to provide you with exceptional heart care, we have created designated Provider Care Teams.  These Care Teams include your primary Cardiologist (physician) and Advanced Practice Providers (APPs -  Physician Assistants and Nurse Practitioners) who all work together to provide you with the care you need, when you need it.  We recommend signing up for the patient portal called "MyChart".  Sign up information is provided on this After Visit Summary.  MyChart is used to connect with patients for Virtual Visits (Telemedicine).  Patients are able to view lab/test results, encounter notes, upcoming appointments, etc.  Non-urgent messages can be sent to your provider as well.   To learn more about what you can do with MyChart, go to [email or web address].    Your next appointment:   Follow up after Testing   The format for your next appointment:   In Person  Provider:   Kate Sable, MD   Other Instructions      Signed, Kate Sable, MD  01/16/2021 5:25 PM    [place]

## 2021-01-17 ENCOUNTER — Telehealth: Payer: Self-pay | Admitting: Cardiology

## 2021-01-17 NOTE — Telephone Encounter (Signed)
Attempted to call and schedule echo and stress test and follow up. No vm .

## 2021-01-23 ENCOUNTER — Ambulatory Visit
Admission: RE | Admit: 2021-01-23 | Discharge: 2021-01-23 | Disposition: A | Discharge status: Home / Self Care | Payer: Medicare Other | Source: Ambulatory Visit | Attending: Cardiology | Admitting: Cardiology

## 2021-01-23 ENCOUNTER — Other Ambulatory Visit: Payer: Self-pay

## 2021-01-23 DIAGNOSIS — R079 Chest pain, unspecified: Secondary | ICD-10-CM | POA: Diagnosis not present

## 2021-01-23 MED ORDER — TECHNETIUM TC 99M TETROFOSMIN IV KIT
10.0000 | PACK | Freq: Once | INTRAVENOUS | Status: AC | PRN
  Administered 2021-01-23: 10.07 via INTRAVENOUS

## 2021-01-23 MED ORDER — REGADENOSON 0.4 MG/5ML IV SOLN
0.4000 mg | Freq: Once | INTRAVENOUS | Status: AC
  Administered 2021-01-23: 0.4 mg via INTRAVENOUS

## 2021-01-23 MED ORDER — TECHNETIUM TC 99M TETROFOSMIN IV KIT
32.2000 | PACK | Freq: Once | INTRAVENOUS | Status: AC | PRN
  Administered 2021-01-23: 32.2 via INTRAVENOUS

## 2021-01-24 LAB — NM MYOCAR MULTI W/SPECT W/WALL MOTION / EF
LV dias vol: 39 mL (ref 46–106)
LV sys vol: 19 mL
MPHR: 145 {beats}/min
Peak HR: 91 {beats}/min
Percent HR: 62 %
Rest HR: 71 {beats}/min
TID: 0.8

## 2021-01-27 IMAGING — CT CT CERVICAL SPINE W/O CM
5 series · 16 of 33 positions shown, 18 images · non-contrast
Comparison: CT of the cervical spine September 24, 2019.

CLINICAL DATA: Ataxia, C-spine trauma.

EXAM:
CT CERVICAL SPINE WITHOUT CONTRAST
TECHNIQUE: Multidetector CT imaging of the cervical spine was performed without
intravenous contrast. Multiplanar CT image reconstructions were also
generated.

[Series 3: c-spine 2.00 br60 s3 axial bone · axial · 0.34mm/px · z∈[-683,-625]mm · 2 of 88 slices shown]
[im 30/88  bone]
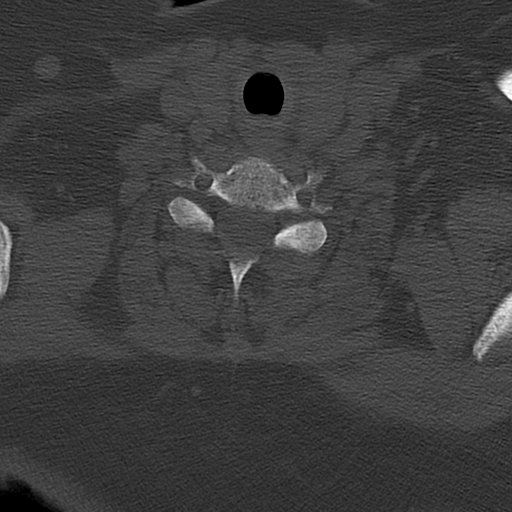
[im 59/88  bone]
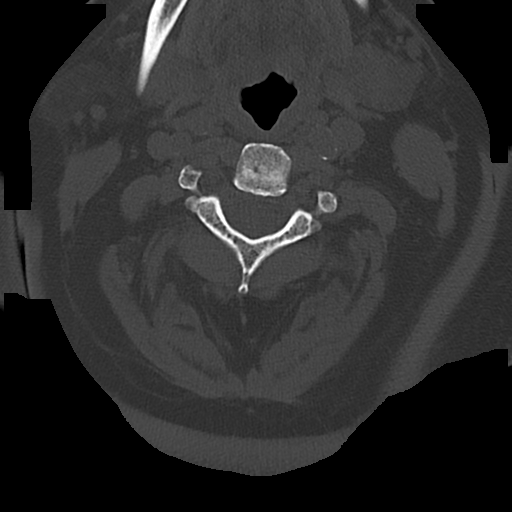

[Series 4: c-spine 2.00 br40 s3 axial (person_name) · axial · 0.34mm/px · z∈[-699,-611]mm · 3 of 88 slices shown, 4 images]
[im 22/88  soft-tissue]
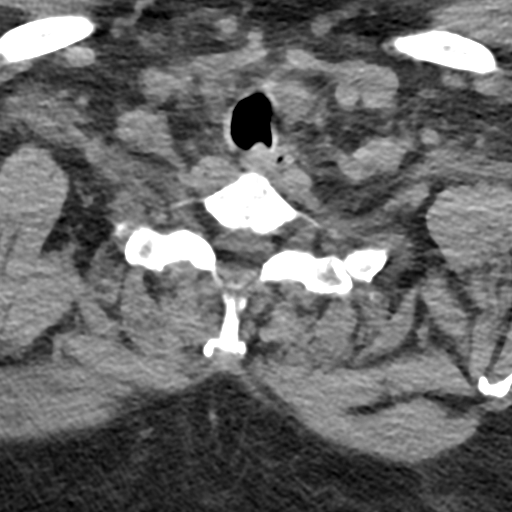
[im 22/88  bone]
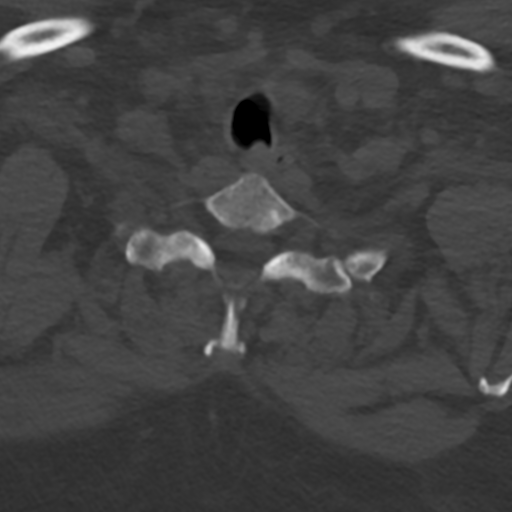
[im 44/88  bone]
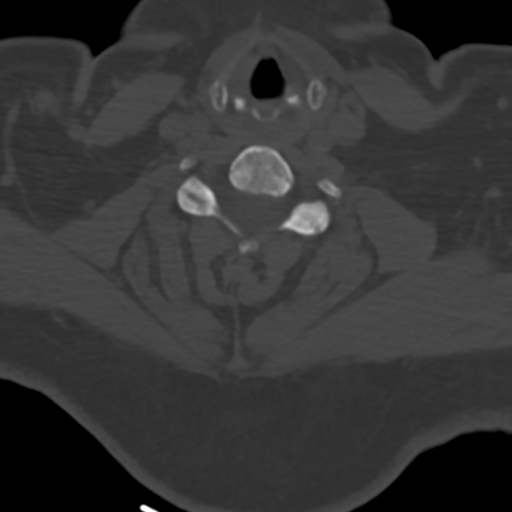
[im 66/88  bone]
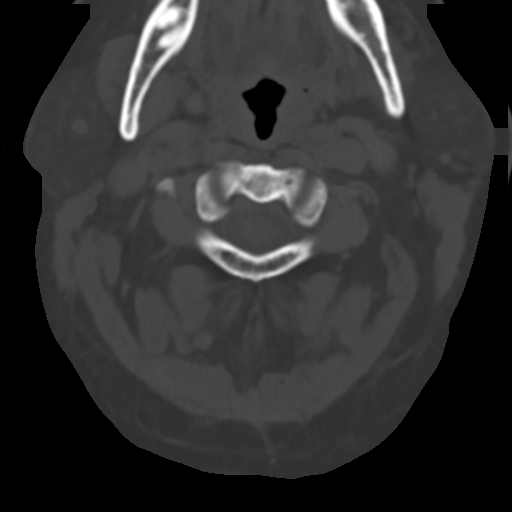

[Series 5: c-spine 2.00 br60 s3 sag bone · sagittal · 0.31mm/px · 5 of 74 slices shown, 6 images]
[im 25/74  bone]
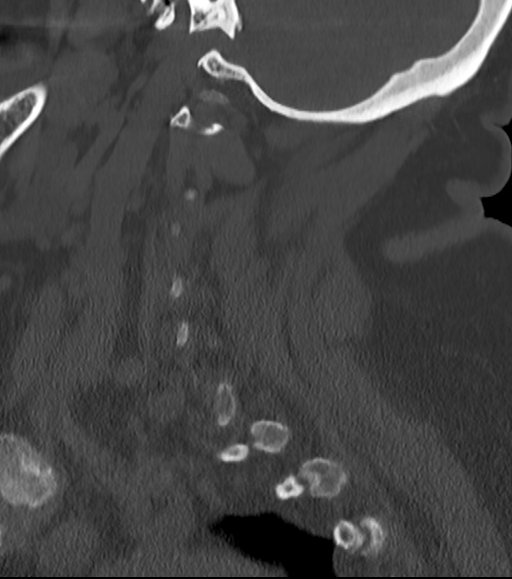
[im 31/74  bone]
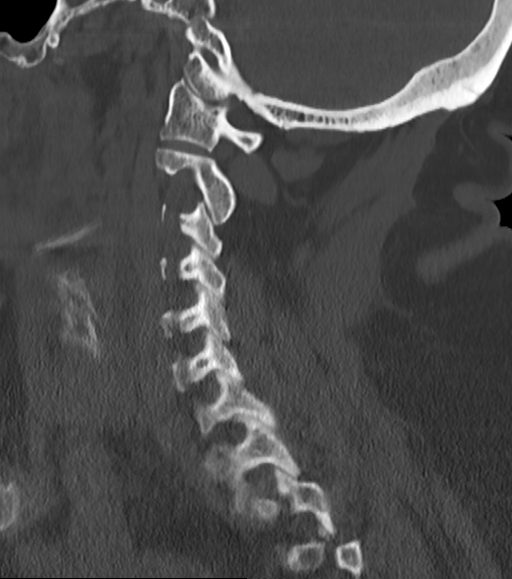
[im 37/74  soft-tissue]
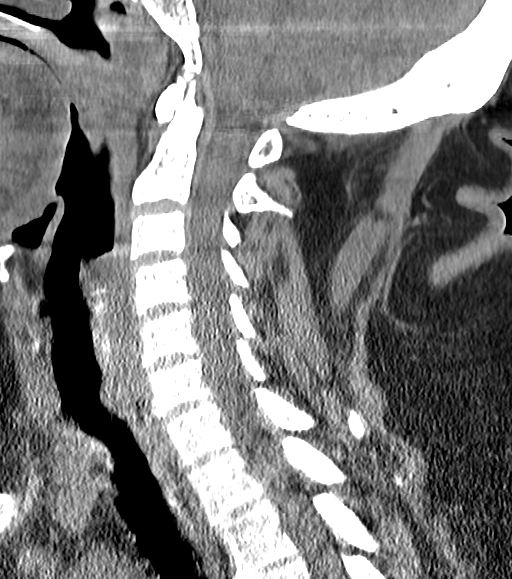
[im 37/74  bone]
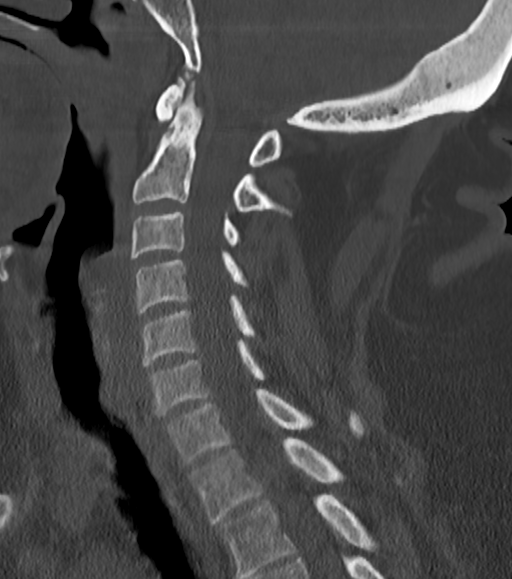
[im 43/74  bone]
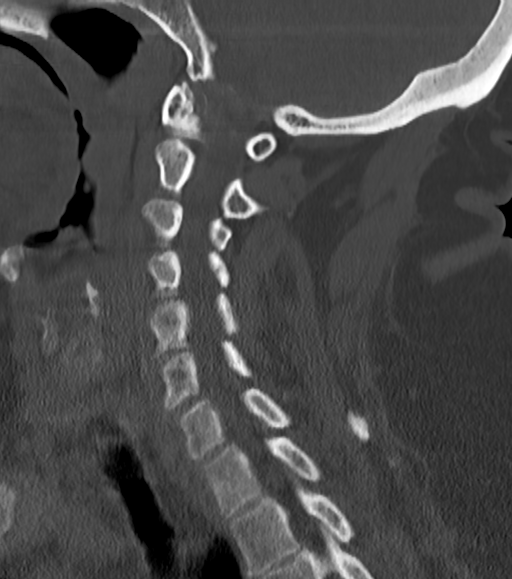
[im 49/74  bone]
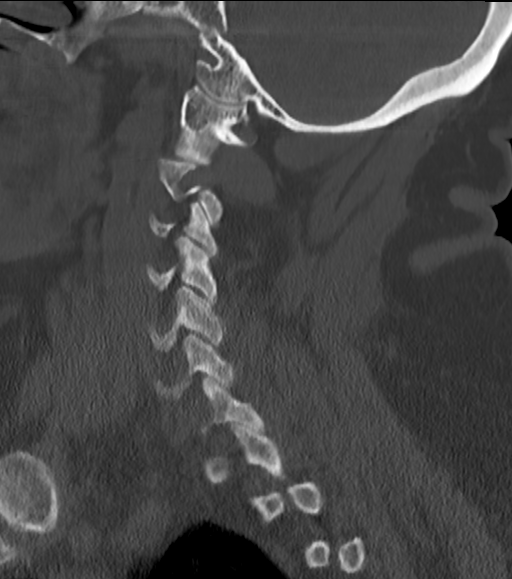

[Series 7: c-spine 2.00 hr60 s3 cor bone · coronal · 0.29mm/px · 3 of 78 slices shown]
[im 16/78  bone]
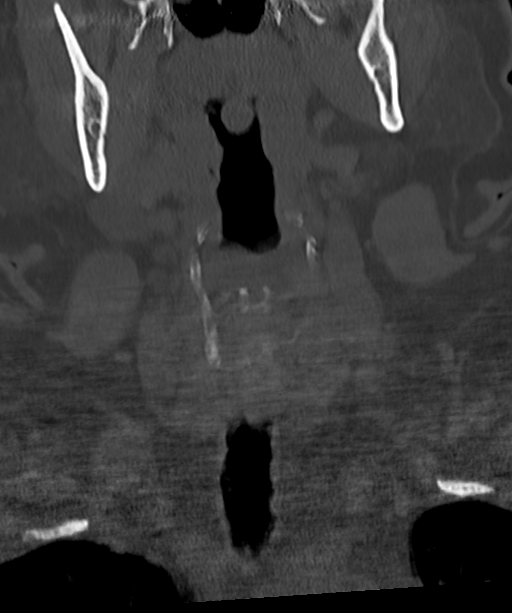
[im 31/78  bone]
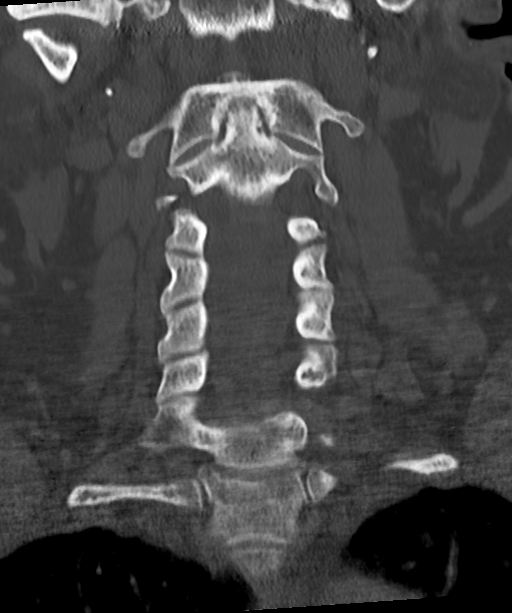
[im 47/78  bone]
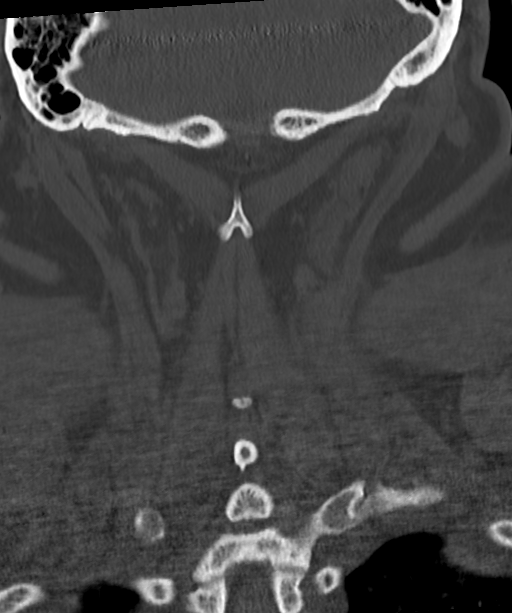

[Series 10: c-spine 2.00 hr60 s3 orthogonal axial · axial · 0.31mm/px · z∈[-712,-619]mm · 3 of 86 slices shown]
[im 22/86  bone]
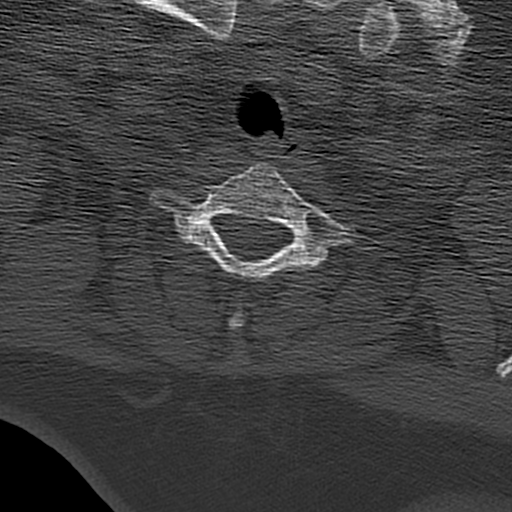
[im 43/86  bone]
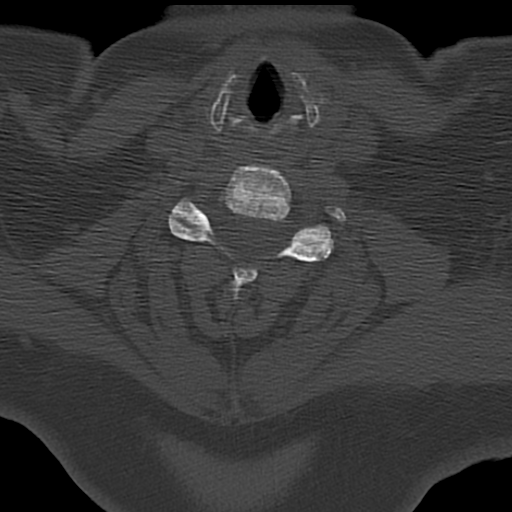
[im 64/86  bone]
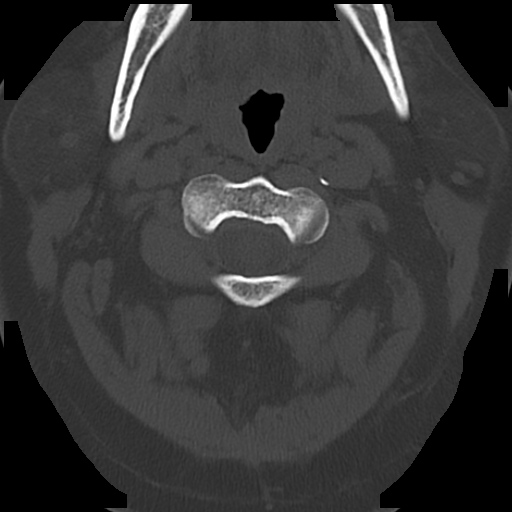

[16 of 33 positions shown; findings below may reference images not displayed]

FINDINGS: Alignment: Normal.

Skull base and vertebrae: No acute fracture. No primary bone lesion
or focal pathologic process.

Soft tissues and spinal canal: No prevertebral fluid or swelling. No
visible canal hematoma.

Disc levels:  Osteoarthritis at C1-2 is unchanged.

Mild facet degenerative changes, more pronounced at C3-4 and C6-7 on
the left.

No spinal canal or neural foraminal stenosis at any level.

Upper chest: Negative.

Other: None.
IMPRESSION: 1. No acute fracture or subluxation of the cervical spine.
2. Mild facet degenerative changes. No spinal canal or neural
foraminal stenosis at any level.

## 2021-02-07 ENCOUNTER — Other Ambulatory Visit: Payer: Self-pay

## 2021-02-07 ENCOUNTER — Ambulatory Visit (INDEPENDENT_AMBULATORY_CARE_PROVIDER_SITE_OTHER): Payer: Medicare Other

## 2021-02-07 DIAGNOSIS — R072 Precordial pain: Secondary | ICD-10-CM

## 2021-02-07 DIAGNOSIS — R079 Chest pain, unspecified: Secondary | ICD-10-CM | POA: Diagnosis not present

## 2021-02-07 LAB — ECHOCARDIOGRAM COMPLETE
AR max vel: 2.16 cm2
AV Area VTI: 2.37 cm2
AV Area mean vel: 2.14 cm2
AV Mean grad: 6 mmHg
AV Peak grad: 10.6 mmHg
Ao pk vel: 1.63 m/s
Area-P 1/2: 2.49 cm2
S' Lateral: 3.09 cm

## 2021-02-07 MED ORDER — PERFLUTREN LIPID MICROSPHERE
1.0000 mL | INTRAVENOUS | Status: AC | PRN
  Administered 2021-02-07: 2 mL via INTRAVENOUS

## 2021-02-13 ENCOUNTER — Ambulatory Visit (INDEPENDENT_AMBULATORY_CARE_PROVIDER_SITE_OTHER): Payer: Medicare Other | Admitting: Cardiology

## 2021-02-13 ENCOUNTER — Encounter: Payer: Self-pay | Admitting: Cardiology

## 2021-02-13 ENCOUNTER — Other Ambulatory Visit: Payer: Self-pay

## 2021-02-13 VITALS — BP 130/70 | HR 81 | Ht 65.0 in | Wt 288.0 lb

## 2021-02-13 DIAGNOSIS — R0609 Other forms of dyspnea: Secondary | ICD-10-CM

## 2021-02-13 DIAGNOSIS — R072 Precordial pain: Secondary | ICD-10-CM | POA: Diagnosis not present

## 2021-02-13 DIAGNOSIS — R0600 Dyspnea, unspecified: Secondary | ICD-10-CM

## 2021-02-13 DIAGNOSIS — I10 Essential (primary) hypertension: Secondary | ICD-10-CM

## 2021-02-13 MED ORDER — FUROSEMIDE 20 MG PO TABS: 20.0000 mg | ORAL_TABLET | Freq: Every day | ORAL | 3 refills | Status: DC | PRN

## 2021-02-13 NOTE — Patient Instructions (Signed)
Medication Instructions:   Take your Lasix only as needed for swelling.  *If you need a refill on your cardiac medications before your next appointment, please call your pharmacy*   Lab Work:  None ordered  If you have labs (blood work) drawn today and your tests are completely normal, you will receive your results only by: Marland Kitchen MyChart Message (if you have MyChart) OR . A paper copy in the mail If you have any lab test that is abnormal or we need to change your treatment, we will call you to review the results.   Testing/Procedures: None ordered   Follow-Up: At [facility], you and your health needs are our priority.  As part of our continuing mission to provide you with exceptional heart care, we have created designated Provider Care Teams.  These Care Teams include your primary Cardiologist (physician) and Advanced Practice Providers (APPs -  Physician Assistants and Nurse Practitioners) who all work together to provide you with the care you need, when you need it.  We recommend signing up for the patient portal called "MyChart".  Sign up information is provided on this After Visit Summary.  MyChart is used to connect with patients for Virtual Visits (Telemedicine).  Patients are able to view lab/test results, encounter notes, upcoming appointments, etc.  Non-urgent messages can be sent to your provider as well.   To learn more about what you can do with MyChart, go to [email or web address].    Your next appointment:   6 month(s)  The format for your next appointment:   In Person  Provider:   Kate Sable, MD   Other Instructions

## 2021-02-13 NOTE — Progress Notes (Signed)
Cardiology Office Note:    Date:  02/13/2021   ID:  Amy [name], DOB 1946/09/03, MRN [id number]  PCP:  Kelton Pillar, MD   [place]  Cardiologist:  Kate Sable, MD  Advanced Practice Provider:  No care team member to display Electrophysiologist:  None       Referring MD: Kelton Pillar, MD   Chief Complaint  Patient presents with  . Other    Follow up post Testing. Patient c/o chest pain, SOB and swelling in ankles. Meds reviewed verbally with patient.     History of Present Illness:    Amy [name] is a 75 y.o. female with a hx of anxiety, hypertension, hyperlipidemia, OSA on CPAP, obesity, who presents for follow-up.  Previously seen due to chest pain and shortness of breath.  Due to risk factors, echo and Lexiscan Myoview was ordered.  Had occasional edema, started on Lasix.  She states leg swelling is better when she wakes up in the morning, gets worse as the day goes on.  Still complains of shortness of breath.  Prior notes Echo 4/31/5400 normal systolic function, EF 60 to 65%, impaired relaxation. Echo 06/2016 showed normal systolic function, EF 60 to 65%.  Impaired relaxation.  Past Medical History:  Diagnosis Date  . Abdominal pain of unknown etiology 02/05/2016  . Anxiety   . Arthritis    ddd- RA  . Asthma    "sleeping asthma"  . Chronic back pain    lumbar steroid injection's  . Complication of anesthesia    Hard to wake up. Pt sts is took 3 days.  . Constipation   . Depression   . Dysphonia    intermittent "voice changes"  . Esophageal dysmotility   . Fibromyalgia   . Fibromyalgia   . GERD (gastroesophageal reflux disease)   . Glaucoma   . Headache   . High cholesterol    ? pt states her doctor told her to continue eating vegetables & fruit  . History of DVT (deep vein thrombosis) 1989   LEFT UPPER ARM  . History of hiatal hernia   . History of MRSA infection 2011  . Hypertension   .  Irregular heart rate    "years ago"  . Low iron   . Lumbago   . Mild obstructive sleep apnea    per study 02-07-2006 - no cpap  . Neuropathy    feet  . Pelvic pain in female   . Peripheral vascular disease ([place])    "poor circulation"  . SBO (small bowel obstruction) ([place]) 06/2016  . Seasonal allergies   . Short of breath on exertion   . Urinary frequency   . Vaginal atrophy   . Weakness    both hands and feet    Past Surgical History:  Procedure Laterality Date  . abdominal adhesions removed    . ABDOMINAL HYSTERECTOMY  1978  . BACK SURGERY    . BUNIONECTOMY Left 2008  . CATARACT EXTRACTION Bilateral 2017  . COLON SURGERY    . COLONOSCOPY    . CYSTO WITH HYDRODISTENSION N/A 07/12/2015   Procedure: CYSTOSCOPY/HYDRODISTENSION;  Surgeon: Bjorn Loser, MD;  Location: [facility];  Service: Urology;  Laterality: N/A;  . DILATION AND CURETTAGE OF UTERUS    . ESOPHAGEAL MANOMETRY N/A 11/22/2014   Procedure: ESOPHAGEAL MANOMETRY (EM);  Surgeon: Winfield Cunas., MD;  Location: [facility];  Service: Endoscopy;  Laterality: N/A;  . ESOPHAGOGASTRODUODENOSCOPY N/A 07/15/2013  Procedure: ESOPHAGOGASTRODUODENOSCOPY (EGD);  Surgeon: Winfield Cunas., MD;  Location: [facility];  Service: Endoscopy;  Laterality: N/A;  need xray  . EXPLORATORY LAPAROTOMY    . EYE SURGERY Bilateral    cataract surgery with lens implants  . HARDWARE REMOVAL N/A 01/02/2016   Procedure: Exploration of Lumbar Fusion,Removal of hardware Lumbar One-Two ;Redo Posterior Lumbar Fusion Lumbar One-Two;  Surgeon: Kary Kos, MD;  Location: [place] [facility];  Service: Neurosurgery;  Laterality: N/A;  . ingrown toenail removal    . LAPAROSCOPIC CHOLECYSTECTOMY  01-11-2006  . LAPAROSCOPY N/A 07/06/2016   Procedure: LAPAROSCOPY DIAGNOSTIC EMERGENT TO OPEN;  Surgeon: Greer Pickerel, MD;  Location: [place];  Service: General;  Laterality: N/A;  . LAPAROTOMY N/A 07/06/2016   Procedure: EXPLORATORY  LAPAROTOMY LYSIS OF ADHESIONS FOR 3 HOURS;  Surgeon: Greer Pickerel, MD;  Location: [place];  Service: General;  Laterality: N/A;  . lipoma removal    . LUMBAR FUSION  2014   L4 -- L5  . MASS EXCISION Left 02/20/2013   Procedure: EXCISION LEFT BUTTOCK  MASS;  Surgeon: Adin Hector, MD;  Location: [facility];  Service: General;  Laterality: Left;  . NOSE SURGERY  2007  . REMOVAL HARDWARE L4-L5/  BILATERAL LAMINECTOMY L2 - L5 AND FUSION  06-12-2011  . SHOULDER OPEN ROTATOR CUFF REPAIR Left 05/05/2014   Procedure: OPEN ACROMIONECTOMY AND OPEN REPAIR OF ROTATOR CUFF, TISSUEMEND GRAFT WITH ANCHOR ;  Surgeon: Tobi Bastos, MD;  Location: [facility];  Service: Orthopedics;  Laterality: Left;  . SPINE SURGERY     x6  . TONSILLECTOMY      Current Medications: Current Meds  Medication Sig  . acetaminophen (TYLENOL) 500 MG tablet Take 1,000 mg by mouth every 6 (six) hours as needed for moderate pain.   Marland Kitchen atorvastatin (LIPITOR) 20 MG tablet Take 1 tablet (20 mg total) by mouth daily.  . B Complex Vitamins (B COMPLEX PO) Take by mouth.  . cloNIDine (CATAPRES) 0.1 MG tablet Take 1 tablet (0.1 mg total) by mouth 3 (three) times daily.  . diclofenac sodium (VOLTAREN) 1 % GEL Apply 4 g topically 4 (four) times daily.  . diphenhydrAMINE (BENADRYL) 25 MG tablet Take 25 mg by mouth 2 (two) times daily as needed for allergies.  Marland Kitchen estradiol (ESTRACE) 0.1 MG/GM vaginal cream Place 1 Applicatorful vaginally 2 (two) times a week.  . Fluoxetine HCl, PMDD, 10 MG TABS Take by mouth.  . fluticasone (FLONASE) 50 MCG/ACT nasal spray Place 1 spray into both nostrils daily.  Marland Kitchen gabapentin (NEURONTIN) 600 MG tablet Take 600 mg by mouth 2 (two) times daily.  . hydrALAZINE (APRESOLINE) 50 MG tablet Take 50 mg by mouth 3 (three) times daily.  . hydrochlorothiazide (HYDRODIURIL) 12.5 MG tablet Take 12.5 mg by mouth daily.  Marland Kitchen losartan (COZAAR) 100 MG tablet TK 1 T PO ONCE D  . meloxicam (MOBIC) 7.5 MG tablet Take 1 tablet (7.5 mg  total) by mouth daily.  . metoprolol (LOPRESSOR) 50 MG tablet Take 1 tablet (50 mg total) by mouth 2 (two) times daily.  Marland Kitchen NIFEdipine (PROCARDIA) 10 MG capsule Take 10 mg by mouth 3 (three) times daily.  Marland Kitchen OVER THE COUNTER MEDICATION Take 2 tablets by mouth daily. Swiss Kriss OTC herbal laxative  . pantoprazole (PROTONIX) 40 MG tablet Take 80 mg by mouth daily.   Marland Kitchen PARoxetine (PAXIL) 30 MG tablet Take 30 mg by mouth daily.  . potassium chloride SA (K-DUR,KLOR-CON) 20 MEQ tablet Take 20 mEq  by mouth daily.  Marland Kitchen triamcinolone ointment (KENALOG) 0.5 % Apply 1 application topically 2 (two) times daily. To rash to feet  . vitamin E 180 MG (400 UNITS) capsule Take 400 Units by mouth daily.  . [DISCONTINUED] furosemide (LASIX) 20 MG tablet Take 1 tablet (20 mg total) by mouth daily.     Allergies:   Latex, Crestor [rosuvastatin calcium], Penicillins, Tetanus toxoids, and Tomato   Social History   Socioeconomic History  . Marital status: Married    Spouse name: Not on file  . Number of children: 1  . Years of education: Not on file  . Highest education level: Master's degree (e.g., MA, MS, MEng, MEd, MSW, MBA)  Occupational History  . Not on file  Tobacco Use  . Smoking status: Former Smoker    Quit date: 07/10/1969    Years since quitting: 51.6  . Smokeless tobacco: Never Used  Vaping Use  . Vaping Use: Never used  Substance and Sexual Activity  . Alcohol use: Not Currently    Comment: rare, social  . Drug use: No  . Sexual activity: Not on file  Other Topics Concern  . Not on file  Social History Narrative   Lives at home with husband    Right handed   Caffeine: max 2 cups coffee per day but not does not drink daily.   Social Determinants of Health   Financial Resource Strain: Not on file  Food Insecurity: Not on file  Transportation Needs: Not on file  Physical Activity: Not on file  Stress: Not on file  Social Connections: Not on file     Family History: The patient's  family history includes Aneurysm in her maternal grandmother; Cancer in her daughter, mother, and sister; Colon cancer in her father; Colon polyps in her father; Diabetes in her mother; Hypertension in her brother, brother, father, mother, and sister; Kidney disease in her brother and mother; Liver cancer in her brother and brother; Osteoarthritis in her mother. There is no history of Migraines or Headache.  ROS:   Please see the history of present illness.     All other systems reviewed and are negative.  EKGs/Labs/Other Studies Reviewed:    The following studies were reviewed today:   EKG:  EKG is  ordered today.  The ekg ordered today demonstrates normal sinus rhythm  Recent Labs: 07/28/2020: ALT 15; BUN 12; Creatinine, Ser 0.98; Hemoglobin 13.9; Platelets 325; Potassium 4.4; Sodium 141; TSH 1.770  Recent Lipid Panel    Component Value Date/Time   CHOL 165 07/03/2016 0627   TRIG 148 07/27/2016 0415   HDL 82 07/03/2016 0627   CHOLHDL 2.0 07/03/2016 0627   VLDL 8 07/03/2016 0627   LDLCALC 75 07/03/2016 0627     Risk Assessment/Calculations:      Physical Exam:    VS:  BP 130/70 (BP Location: Right Arm, Patient Position: Sitting, Cuff Size: Large)   Pulse 81   Ht 5\' 5"  ([id number] m)   Wt 288 lb (130.6 kg)   SpO2 96%   BMI 47.93 kg/m     Wt Readings from Last 3 Encounters:  02/13/21 288 lb (130.6 kg)  01/16/21 299 lb (135.6 kg)  07/28/20 293 lb (132.9 kg)     GEN:  Well nourished, well developed in no acute distress HEENT: Normal NECK: No JVD; No carotid bruits LYMPHATICS: No lymphadenopathy CARDIAC: RRR, no murmurs, rubs, gallops RESPIRATORY:  Clear to auscultation without rales, wheezing or rhonchi  ABDOMEN: Soft, non-tender, non-distended  MUSCULOSKELETAL:  trace edema; No deformity  SKIN: Warm and dry NEUROLOGIC:  Alert and oriented x 3 PSYCHIATRIC:  Normal affect   ASSESSMENT:    1. Precordial pain   2. Dyspnea on exertion   3. Primary hypertension   4.  Morbid obesity ([place])    PLAN:    In order of problems listed above:  1. Chest pain, risk factors hypertension, hyperlipidemia, and obesity.  Echo showed normal systolic function, grade 1 diastolic function, EF 60 to 65%.  Lexiscan Myoview with no evidence for ischemia.  Patient made aware of results. 2. Dyspnea on exertion likely from deconditioning/morbid obesity.  Continue Lasix as needed for edema.  Likely from obesity, dependent edema.  Leg raising while in seated position advised.  Weight loss advised. 3. Hypertension, BP controlled.  Continue current BP meds. 4. Morbid obesity, low-calorie diet, weight loss advised.  Follow-up in 6 months.    Medication Adjustments/Labs and Tests Ordered: Current medicines are reviewed at length with the patient today.  Concerns regarding medicines are outlined above.  Orders Placed This Encounter  Procedures  . EKG 12-Lead   Meds ordered this encounter  Medications  . furosemide (LASIX) 20 MG tablet    Sig: Take 1 tablet (20 mg total) by mouth daily as needed for edema.    Dispense:  30 tablet    Refill:  3    Patient Instructions  Medication Instructions:   Take your Lasix only as needed for swelling.  *If you need a refill on your cardiac medications before your next appointment, please call your pharmacy*   Lab Work:  None ordered  If you have labs (blood work) drawn today and your tests are completely normal, you will receive your results only by: Marland Kitchen MyChart Message (if you have MyChart) OR . A paper copy in the mail If you have any lab test that is abnormal or we need to change your treatment, we will call you to review the results.   Testing/Procedures: None ordered   Follow-Up: At [facility], you and your health needs are our priority.  As part of our continuing mission to provide you with exceptional heart care, we have created designated Provider Care Teams.  These Care Teams include your primary Cardiologist  (physician) and Advanced Practice Providers (APPs -  Physician Assistants and Nurse Practitioners) who all work together to provide you with the care you need, when you need it.  We recommend signing up for the patient portal called "MyChart".  Sign up information is provided on this After Visit Summary.  MyChart is used to connect with patients for Virtual Visits (Telemedicine).  Patients are able to view lab/test results, encounter notes, upcoming appointments, etc.  Non-urgent messages can be sent to your provider as well.   To learn more about what you can do with MyChart, go to [email or web address].    Your next appointment:   6 month(s)  The format for your next appointment:   In Person  Provider:   Kate Sable, MD   Other Instructions      Signed, Kate Sable, MD  02/13/2021 11:04 AM    [place]

## 2021-03-07 ENCOUNTER — Other Ambulatory Visit: Payer: Self-pay | Admitting: Family Medicine

## 2021-03-07 DIAGNOSIS — Z1231 Encounter for screening mammogram for malignant neoplasm of breast: Secondary | ICD-10-CM

## 2021-03-10 ENCOUNTER — Other Ambulatory Visit: Payer: Self-pay | Admitting: Family Medicine

## 2021-03-10 DIAGNOSIS — N644 Mastodynia: Secondary | ICD-10-CM

## 2021-03-11 DIAGNOSIS — Z23 Encounter for immunization: Secondary | ICD-10-CM | POA: Diagnosis not present

## 2021-03-16 DIAGNOSIS — R490 Dysphonia: Secondary | ICD-10-CM | POA: Diagnosis not present

## 2021-03-16 DIAGNOSIS — K219 Gastro-esophageal reflux disease without esophagitis: Secondary | ICD-10-CM | POA: Diagnosis not present

## 2021-03-16 DIAGNOSIS — M48061 Spinal stenosis, lumbar region without neurogenic claudication: Secondary | ICD-10-CM | POA: Diagnosis not present

## 2021-03-16 DIAGNOSIS — J309 Allergic rhinitis, unspecified: Secondary | ICD-10-CM | POA: Diagnosis not present

## 2021-03-16 DIAGNOSIS — E7800 Pure hypercholesterolemia, unspecified: Secondary | ICD-10-CM | POA: Diagnosis not present

## 2021-03-16 DIAGNOSIS — N952 Postmenopausal atrophic vaginitis: Secondary | ICD-10-CM | POA: Diagnosis not present

## 2021-03-16 DIAGNOSIS — F324 Major depressive disorder, single episode, in partial remission: Secondary | ICD-10-CM | POA: Diagnosis not present

## 2021-03-16 DIAGNOSIS — R609 Edema, unspecified: Secondary | ICD-10-CM | POA: Diagnosis not present

## 2021-03-16 DIAGNOSIS — Z0000 Encounter for general adult medical examination without abnormal findings: Secondary | ICD-10-CM | POA: Diagnosis not present

## 2021-03-16 DIAGNOSIS — I10 Essential (primary) hypertension: Secondary | ICD-10-CM | POA: Diagnosis not present

## 2021-03-16 DIAGNOSIS — M1990 Unspecified osteoarthritis, unspecified site: Secondary | ICD-10-CM | POA: Diagnosis not present

## 2021-03-16 DIAGNOSIS — G629 Polyneuropathy, unspecified: Secondary | ICD-10-CM | POA: Diagnosis not present

## 2021-03-20 ENCOUNTER — Emergency Department (HOSPITAL_COMMUNITY): Payer: Medicare Other

## 2021-03-20 ENCOUNTER — Emergency Department (HOSPITAL_COMMUNITY)
Admission: EM | Admit: 2021-03-20 | Discharge: 2021-03-21 | Disposition: A | Discharge status: Home / Self Care | Payer: Medicare Other | Attending: Emergency Medicine | Admitting: Emergency Medicine

## 2021-03-20 ENCOUNTER — Other Ambulatory Visit: Payer: Self-pay

## 2021-03-20 ENCOUNTER — Encounter (HOSPITAL_COMMUNITY): Payer: Self-pay | Admitting: Emergency Medicine

## 2021-03-20 DIAGNOSIS — Z87891 Personal history of nicotine dependence: Secondary | ICD-10-CM | POA: Insufficient documentation

## 2021-03-20 DIAGNOSIS — Z91040 Latex allergy status: Secondary | ICD-10-CM | POA: Insufficient documentation

## 2021-03-20 DIAGNOSIS — W19XXXA Unspecified fall, initial encounter: Secondary | ICD-10-CM

## 2021-03-20 DIAGNOSIS — Z79899 Other long term (current) drug therapy: Secondary | ICD-10-CM | POA: Diagnosis not present

## 2021-03-20 DIAGNOSIS — J45909 Unspecified asthma, uncomplicated: Secondary | ICD-10-CM | POA: Insufficient documentation

## 2021-03-20 DIAGNOSIS — W01198A Fall on same level from slipping, tripping and stumbling with subsequent striking against other object, initial encounter: Secondary | ICD-10-CM | POA: Insufficient documentation

## 2021-03-20 DIAGNOSIS — R911 Solitary pulmonary nodule: Secondary | ICD-10-CM

## 2021-03-20 DIAGNOSIS — R519 Headache, unspecified: Secondary | ICD-10-CM | POA: Insufficient documentation

## 2021-03-20 DIAGNOSIS — I700 Atherosclerosis of aorta: Secondary | ICD-10-CM | POA: Diagnosis not present

## 2021-03-20 DIAGNOSIS — R1031 Right lower quadrant pain: Secondary | ICD-10-CM | POA: Diagnosis not present

## 2021-03-20 DIAGNOSIS — Z8719 Personal history of other diseases of the digestive system: Secondary | ICD-10-CM | POA: Diagnosis not present

## 2021-03-20 DIAGNOSIS — I10 Essential (primary) hypertension: Secondary | ICD-10-CM | POA: Diagnosis not present

## 2021-03-20 DIAGNOSIS — K219 Gastro-esophageal reflux disease without esophagitis: Secondary | ICD-10-CM | POA: Diagnosis not present

## 2021-03-20 DIAGNOSIS — R109 Unspecified abdominal pain: Secondary | ICD-10-CM | POA: Insufficient documentation

## 2021-03-20 DIAGNOSIS — Z7952 Long term (current) use of systemic steroids: Secondary | ICD-10-CM | POA: Insufficient documentation

## 2021-03-20 DIAGNOSIS — M25551 Pain in right hip: Secondary | ICD-10-CM | POA: Diagnosis not present

## 2021-03-20 DIAGNOSIS — Z043 Encounter for examination and observation following other accident: Secondary | ICD-10-CM | POA: Diagnosis not present

## 2021-03-20 DIAGNOSIS — S0990XA Unspecified injury of head, initial encounter: Secondary | ICD-10-CM | POA: Diagnosis not present

## 2021-03-20 DIAGNOSIS — Z981 Arthrodesis status: Secondary | ICD-10-CM | POA: Diagnosis not present

## 2021-03-20 LAB — CBC WITH DIFFERENTIAL/PLATELET
Abs Immature Granulocytes: 0.01 10*3/uL (ref 0.00–0.07)
Basophils Absolute: 0 10*3/uL (ref 0.0–0.1)
Basophils Relative: 0 %
Eosinophils Absolute: 0.1 10*3/uL (ref 0.0–0.5)
Eosinophils Relative: 3 %
HCT: 41.6 % (ref 36.0–46.0)
Hemoglobin: 13.3 g/dL (ref 12.0–15.0)
Immature Granulocytes: 0 %
Lymphocytes Relative: 39 %
Lymphs Abs: 2 10*3/uL (ref 0.7–4.0)
MCH: 30.1 pg (ref 26.0–34.0)
MCHC: 32 g/dL (ref 30.0–36.0)
MCV: 94.1 fL (ref 80.0–100.0)
Monocytes Absolute: 0.6 10*3/uL (ref 0.1–1.0)
Monocytes Relative: 12 %
Neutro Abs: 2.3 10*3/uL (ref 1.7–7.7)
Neutrophils Relative %: 46 %
Platelets: 351 10*3/uL (ref 150–400)
RBC: 4.42 MIL/uL (ref 3.87–5.11)
RDW: 14.8 % (ref 11.5–15.5)
WBC: 5 10*3/uL (ref 4.0–10.5)
nRBC: 0 % (ref 0.0–0.2)

## 2021-03-20 LAB — COMPREHENSIVE METABOLIC PANEL
ALT: 23 U/L (ref 0–44)
AST: 26 U/L (ref 15–41)
Albumin: 3.7 g/dL (ref 3.5–5.0)
Alkaline Phosphatase: 99 U/L (ref 38–126)
Anion gap: 10 (ref 5–15)
BUN: 15 mg/dL (ref 8–23)
CO2: 29 mmol/L (ref 22–32)
Calcium: 9.5 mg/dL (ref 8.9–10.3)
Chloride: 99 mmol/L (ref 98–111)
Creatinine, Ser: 0.99 mg/dL (ref 0.44–1.00)
GFR, Estimated: 59 mL/min — ABNORMAL LOW (ref >60)
Glucose, Bld: 110 mg/dL — ABNORMAL HIGH (ref 70–99)
Potassium: 3.4 mmol/L — ABNORMAL LOW (ref 3.5–5.1)
Sodium: 138 mmol/L (ref 135–145)
Total Bilirubin: 0.7 mg/dL (ref 0.3–1.2)
Total Protein: 7.9 g/dL (ref 6.5–8.1)

## 2021-03-20 LAB — LIPASE, BLOOD: Lipase: 34 U/L (ref 11–51)

## 2021-03-20 LAB — TROPONIN I (HIGH SENSITIVITY)
Troponin I (High Sensitivity): 2 ng/L (ref <18)
Troponin I (High Sensitivity): 4 ng/L (ref <18)

## 2021-03-20 NOTE — ED Provider Notes (Signed)
[facility] EMERGENCY DEPARTMENT Provider Note   CSN: [id number] Arrival date & time: 03/20/21  1848     History Chief Complaint  Patient presents with   Fall    Headache/abd pain/hip pain     Amy [name] is a 75 y.o. female.  The history is provided by the patient.  Fall This is a new problem. The current episode started more than 1 week ago (10 days ago). The problem occurs rarely. The problem has not changed since onset.Pertinent negatives include no chest pain and no shortness of breath. Nothing aggravates the symptoms. Nothing relieves the symptoms. She has tried nothing for the symptoms. The treatment provided no relief.  Patient with chronic pain who had a fall on 6/18 and has ongoing pain in the head and abdomen from the fall.  No f/c/r.  No n/v/d.     Past Medical History:  Diagnosis Date   Abdominal pain of unknown etiology 02/05/2016   Anxiety    Arthritis    ddd- RA   Asthma    "sleeping asthma"   Chronic back pain    lumbar steroid injection's   Complication of anesthesia    Hard to wake up. Pt sts is took 3 days.   Constipation    Depression    Dysphonia    intermittent "voice changes"   Esophageal dysmotility    Fibromyalgia    Fibromyalgia    GERD (gastroesophageal reflux disease)    Glaucoma    Headache    High cholesterol    ? pt states her doctor told her to continue eating vegetables & fruit   History of DVT (deep vein thrombosis) 1989   LEFT UPPER ARM   History of hiatal hernia    History of MRSA infection 2011   Hypertension    Irregular heart rate    "years ago"   Low iron    Lumbago    Mild obstructive sleep apnea    per study 02-07-2006 - no cpap   Neuropathy    feet   Pelvic pain in female    Peripheral vascular disease ([place])    "poor circulation"   SBO (small bowel obstruction) ([place]) 06/2016   Seasonal allergies    Short of breath on exertion    Urinary frequency    Vaginal atrophy     Weakness    both hands and feet    Patient Active Problem List   Diagnosis Date Noted   Uncontrolled daytime somnolence 04/12/2019   Snoring 01/22/2019   Morbid obesity with body mass index of 45.0-49.9 in adult ([place]) 01/22/2019   OSA on CPAP NOT COMPLIANT 01/22/2019   Wound dehiscence 07/22/2016   Dyspnea 07/21/2016   Acute encephalopathy 07/21/2016   Anemia 07/21/2016   Pancreatitis 07/02/2016   SBO (small bowel obstruction) ([place]) 07/02/2016   Long term current use of opiate analgesic 06/26/2016   Long term prescription opiate use 06/26/2016   Opiate use 06/26/2016   Encounter for therapeutic drug level monitoring 06/26/2016   Encounter for pain management planning 06/26/2016   Chronic pain 06/26/2016   Disturbance of skin sensation 06/26/2016   Chronic low back pain (Location of Primary Source of Pain) (Bilateral) (R>L) 06/26/2016   Chronic hip pain (Location of Secondary source of pain) (Bilateral) (R>L) 06/26/2016   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) 06/26/2016   Chronic shoulder pain (Location of Tertiary source of pain) (Bilateral) (R>L) 06/26/2016   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) 06/26/2016  Chronic knee pain (Bilateral) (R>L) 06/26/2016   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) 06/26/2016   Chronic neck pain (Right) 06/26/2016   Lumbar spondylosis 06/26/2016   Lumbar facet syndrome 06/26/2016   Lumbar facet hypertrophy 06/26/2016   Epidural fibrosis 06/26/2016   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   Neurogenic pain 06/26/2016   Occipital headaches 06/26/2016   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   History of lumbar fusion 06/26/2016   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   Neuropathic pain of both legs 10/13/2015   Constipation 10/13/2015   Complete rotator cuff tear of left shoulder 05/05/2014   Depression 08/06/2013   Hypokalemia 08/06/2013   GERD (gastroesophageal reflux disease) 08/06/2013   Spinal stenosis of lumbar  region 08/06/2013   Bladder spasm 08/06/2013   Morbid obesity ([place]) 02/03/2013   Lipoma of buttock s/p excision 02/20/2013 02/03/2013   DYSPNEA 01/23/2008   Obstructive sleep apnea 09/12/2007   HTN (hypertension) 09/12/2007   Allergic rhinitis 09/12/2007   SLEEPINESS 09/12/2007   ANGINA, HX OF 09/12/2007    Past Surgical History:  Procedure Laterality Date   abdominal adhesions removed     ABDOMINAL HYSTERECTOMY  1978   BACK SURGERY     BUNIONECTOMY Left 2008   CATARACT EXTRACTION Bilateral 2017   COLON SURGERY     COLONOSCOPY     CYSTO WITH HYDRODISTENSION N/A 07/12/2015   Procedure: CYSTOSCOPY/HYDRODISTENSION;  Surgeon: Bjorn Loser, MD;  Location: [facility];  Service: Urology;  Laterality: N/A;   DILATION AND CURETTAGE OF UTERUS     ESOPHAGEAL MANOMETRY N/A 11/22/2014   Procedure: ESOPHAGEAL MANOMETRY (EM);  Surgeon: Winfield Cunas., MD;  Location: [facility];  Service: Endoscopy;  Laterality: N/A;   ESOPHAGOGASTRODUODENOSCOPY N/A 07/15/2013   Procedure: ESOPHAGOGASTRODUODENOSCOPY (EGD);  Surgeon: Winfield Cunas., MD;  Location: [facility];  Service: Endoscopy;  Laterality: N/A;  need xray   EXPLORATORY LAPAROTOMY     EYE SURGERY Bilateral    cataract surgery with lens implants   HARDWARE REMOVAL N/A 01/02/2016   Procedure: Exploration of Lumbar Fusion,Removal of hardware Lumbar One-Two ;Redo Posterior Lumbar Fusion Lumbar One-Two;  Surgeon: Kary Kos, MD;  Location: [facility];  Service: Neurosurgery;  Laterality: N/A;   ingrown toenail removal     LAPAROSCOPIC CHOLECYSTECTOMY  01-11-2006   LAPAROSCOPY N/A 07/06/2016   Procedure: LAPAROSCOPY DIAGNOSTIC EMERGENT TO OPEN;  Surgeon: Greer Pickerel, MD;  Location: [facility];  Service: General;  Laterality: N/A;   LAPAROTOMY N/A 07/06/2016   Procedure: EXPLORATORY LAPAROTOMY LYSIS OF ADHESIONS FOR 3 HOURS;  Surgeon: Greer Pickerel, MD;  Location: [place];  Service: General;  Laterality: N/A;   lipoma removal      LUMBAR FUSION  2014   L4 -- L5   MASS EXCISION Left 02/20/2013   Procedure: EXCISION LEFT BUTTOCK  MASS;  Surgeon: Adin Hector, MD;  Location: [facility];  Service: General;  Laterality: Left;   NOSE SURGERY  2007   REMOVAL HARDWARE L4-L5/  BILATERAL LAMINECTOMY L2 - L5 AND FUSION  06-12-2011   SHOULDER OPEN ROTATOR CUFF REPAIR Left 05/05/2014   Procedure: OPEN ACROMIONECTOMY AND OPEN REPAIR OF ROTATOR CUFF, TISSUEMEND GRAFT WITH ANCHOR ;  Surgeon: Tobi Bastos, MD;  Location: [facility];  Service: Orthopedics;  Laterality: Left;   SPINE SURGERY     x6   TONSILLECTOMY       OB History   No obstetric history on file.     Family History  Problem Relation Age  of Onset   Hypertension Mother    Cancer Mother        cervix   Kidney disease Mother    Osteoarthritis Mother    Diabetes Mother    Hypertension Sister    Cancer Sister        polyps   Kidney disease Brother    Cancer Daughter        leukemia   Hypertension Brother    Liver cancer Brother    Hypertension Father    Colon cancer Father    Colon polyps Father    Hypertension Brother    Liver cancer Brother    Aneurysm Maternal Grandmother    Migraines Neg Hx    Headache Neg Hx     Social History   Tobacco Use   Smoking status: Former    Pack years: 0.00    Types: Cigarettes    Quit date: 07/10/1969    Years since quitting: 51.7   Smokeless tobacco: Never  Vaping Use   Vaping Use: Never used  Substance Use Topics   Alcohol use: Not Currently    Comment: rare, social   Drug use: No    Home Medications Prior to Admission medications   Medication Sig Start Date End Date Taking? Authorizing Provider  acetaminophen (TYLENOL) 500 MG tablet Take 1,000 mg by mouth every 6 (six) hours as needed for moderate pain.     [provider]  atorvastatin (LIPITOR) 20 MG tablet Take 1 tablet (20 mg total) by mouth daily. 01/16/21 04/16/21  Kate Sable, MD  B Complex Vitamins (B COMPLEX PO) Take by mouth.     [provider]  cloNIDine (CATAPRES) 0.1 MG tablet Take 1 tablet (0.1 mg total) by mouth 3 (three) times daily. 08/01/16   Meuth, Brooke A, PA-C  diclofenac sodium (VOLTAREN) 1 % GEL Apply 4 g topically 4 (four) times daily. 06/23/19   Landis Martins, DPM  diphenhydrAMINE (BENADRYL) 25 MG tablet Take 25 mg by mouth 2 (two) times daily as needed for allergies.    [provider]  estradiol (ESTRACE) 0.1 MG/GM vaginal cream Place 1 Applicatorful vaginally 2 (two) times a week. 08/02/16   Meuth, Brooke A, PA-C  Fluoxetine HCl, PMDD, 10 MG TABS Take by mouth.    [provider]  fluticasone (FLONASE) 50 MCG/ACT nasal spray Place 1 spray into both nostrils daily. 08/02/16   Meuth, Brooke A, PA-C  furosemide (LASIX) 20 MG tablet Take 1 tablet (20 mg total) by mouth daily as needed for edema. 02/13/21 05/14/21  Kate Sable, MD  gabapentin (NEURONTIN) 600 MG tablet Take 600 mg by mouth 2 (two) times daily. 11/30/19   [provider]  hydrALAZINE (APRESOLINE) 50 MG tablet Take 50 mg by mouth 3 (three) times daily.    [provider]  hydrochlorothiazide (HYDRODIURIL) 12.5 MG tablet Take 12.5 mg by mouth daily. 05/08/19   [provider]  losartan (COZAAR) 100 MG tablet TK 1 T PO ONCE D 04/21/19   [provider]  meloxicam (MOBIC) 7.5 MG tablet Take 1 tablet (7.5 mg total) by mouth daily. 07/28/20   Melvenia Beam, MD  metoprolol (LOPRESSOR) 50 MG tablet Take 1 tablet (50 mg total) by mouth 2 (two) times daily. 08/01/16   Meuth, Brooke A, PA-C  NIFEdipine (PROCARDIA) 10 MG capsule Take 10 mg by mouth 3 (three) times daily.    [provider]  OVER THE COUNTER MEDICATION Take 2 tablets by mouth daily. [employer]  OTC herbal laxative    [provider]  pantoprazole (PROTONIX) 40 MG tablet Take 80 mg by mouth daily.  01/17/18   [provider]  PARoxetine (PAXIL) 30 MG tablet Take 30 mg by mouth daily.    [provider]  potassium chloride SA (K-DUR,KLOR-CON) 20 MEQ tablet Take 20 mEq by mouth daily.    [provider]  triamcinolone ointment (KENALOG) 0.5 % Apply 1 application topically 2 (two) times daily. To rash to feet 06/23/19   Landis Martins, DPM  vitamin E 180 MG (400 UNITS) capsule Take 400 Units by mouth daily.    [provider]    Allergies    Latex, Crestor [rosuvastatin calcium], Penicillins, Tetanus toxoids, and Tomato  Review of Systems   Review of Systems  Constitutional:  Negative for fever.  HENT:  Negative for drooling.   Eyes:  Negative for redness.  Respiratory:  Negative for shortness of breath.   Cardiovascular:  Negative for chest pain.  Gastrointestinal:  Negative for vomiting.  Genitourinary:  Negative for difficulty urinating.  Musculoskeletal:  Negative for neck stiffness.  Skin:  Negative for wound.  Neurological:  Negative for facial asymmetry.  Psychiatric/Behavioral:  Negative for agitation.   All other systems reviewed and are negative.  Physical Exam Updated Vital Signs BP (!) 143/74   Pulse 86   Temp 98.1 F (36.7 C)   Resp 14   Ht 5\' 5"  (1.651 m)   Wt (!) 185 kg   SpO2 100%   BMI 67.87 kg/m   Physical Exam Vitals and nursing note reviewed.  Constitutional:      General: She is not in acute distress.    Appearance: Normal appearance.  HENT:     Head: Normocephalic and atraumatic.     Nose: Nose normal.  Eyes:     Conjunctiva/sclera: Conjunctivae normal.     Pupils: Pupils are equal, round, and reactive to light.  Cardiovascular:     Rate and Rhythm: Normal rate and regular rhythm.     Pulses: Normal pulses.     Heart sounds: Normal heart sounds.  Pulmonary:     Effort: Pulmonary effort is normal.     Breath sounds: Normal breath sounds.  Abdominal:     General: Abdomen is flat. Bowel sounds are normal.     Palpations: Abdomen is soft.     Tenderness: There is no abdominal tenderness. There is no guarding.   Musculoskeletal:        General: Normal range of motion.     Cervical back: Normal range of motion and neck supple.  Skin:    General: Skin is warm and dry.     Capillary Refill: Capillary refill takes less than 2 seconds.  Neurological:     General: No focal deficit present.     Mental Status: She is alert and oriented to person, place, and time.     Deep Tendon Reflexes: Reflexes normal.  Psychiatric:        Mood and Affect: Mood normal.        Behavior: Behavior normal.    ED Results / Procedures / Treatments   Labs (all labs ordered are listed, but only abnormal results are displayed) Results for orders placed or performed during the hospital encounter of 03/20/21  CBC with Differential  Result Value Ref Range   WBC 5.0 4.0 - 10.5 K/uL   RBC 4.42 3.87 - 5.11 MIL/uL   Hemoglobin 13.3 12.0 - 15.0 g/dL  HCT 41.6 36.0 - 46.0 %   MCV 94.1 80.0 - 100.0 fL   MCH 30.1 26.0 - 34.0 pg   MCHC 32.0 30.0 - 36.0 g/dL   RDW 14.8 11.5 - 15.5 %   Platelets 351 150 - 400 K/uL   nRBC 0.0 0.0 - 0.2 %   Neutrophils Relative % 46 %   Neutro Abs 2.3 1.7 - 7.7 K/uL   Lymphocytes Relative 39 %   Lymphs Abs 2.0 0.7 - 4.0 K/uL   Monocytes Relative 12 %   Monocytes Absolute 0.6 0.1 - 1.0 K/uL   Eosinophils Relative 3 %   Eosinophils Absolute 0.1 0.0 - 0.5 K/uL   Basophils Relative 0 %   Basophils Absolute 0.0 0.0 - 0.1 K/uL   Immature Granulocytes 0 %   Abs Immature Granulocytes 0.01 0.00 - 0.07 K/uL  Comprehensive metabolic panel  Result Value Ref Range   Sodium 138 135 - 145 mmol/L   Potassium 3.4 (L) 3.5 - 5.1 mmol/L   Chloride 99 98 - 111 mmol/L   CO2 29 22 - 32 mmol/L   Glucose, Bld 110 (H) 70 - 99 mg/dL   BUN 15 8 - 23 mg/dL   Creatinine, Ser 0.99 0.44 - 1.00 mg/dL   Calcium 9.5 8.9 - 10.3 mg/dL   Total Protein 7.9 6.5 - 8.1 g/dL   Albumin 3.7 3.5 - 5.0 g/dL   AST 26 15 - 41 U/L   ALT 23 0 - 44 U/L   Alkaline Phosphatase 99 38 - 126 U/L   Total Bilirubin 0.7 0.3 - 1.2 mg/dL    GFR, Estimated 59 (L) >60 mL/min   Anion gap 10 5 - 15  Lipase, blood  Result Value Ref Range   Lipase 34 11 - 51 U/L  Troponin I (High Sensitivity)  Result Value Ref Range   Troponin I (High Sensitivity) 2 <18 ng/L   DG Chest 1 View  Result Date: 03/20/2021 CLINICAL DATA:  Fall EXAM: CHEST  1 VIEW COMPARISON:  07/11/2020 FINDINGS: Heart and mediastinal contours are within normal limits. No focal opacities or effusions. No acute bony abnormality. No pneumothorax IMPRESSION: No active disease. Electronically Signed   By: Rolm Baptise M.D.   On: 03/20/2021 21:02   CT Head Wo Contrast  Result Date: 03/20/2021 CLINICAL DATA:  Head trauma, fall EXAM: CT HEAD WITHOUT CONTRAST TECHNIQUE: Contiguous axial images were obtained from the base of the skull through the vertex without intravenous contrast. COMPARISON:  07/15/2020 FINDINGS: Brain: No acute intracranial abnormality. Specifically, no hemorrhage, hydrocephalus, mass lesion, acute infarction, or significant intracranial injury. Vascular: No hyperdense vessel or unexpected calcification. Skull: No acute calvarial abnormality. Sinuses/Orbits: No acute findings Other: None IMPRESSION: No acute intracranial abnormality. Electronically Signed   By: Rolm Baptise M.D.   On: 03/20/2021 21:26     Radiology DG Chest 1 View  Result Date: 03/20/2021 CLINICAL DATA:  Fall EXAM: CHEST  1 VIEW COMPARISON:  07/11/2020 FINDINGS: Heart and mediastinal contours are within normal limits. No focal opacities or effusions. No acute bony abnormality. No pneumothorax IMPRESSION: No active disease. Electronically Signed   By: Rolm Baptise M.D.   On: 03/20/2021 21:02   CT Head Wo Contrast  Result Date: 03/20/2021 CLINICAL DATA:  Head trauma, fall EXAM: CT HEAD WITHOUT CONTRAST TECHNIQUE: Contiguous axial images were obtained from the base of the skull through the vertex without intravenous contrast. COMPARISON:  07/15/2020 FINDINGS: Brain: No acute intracranial  abnormality. Specifically, no hemorrhage, hydrocephalus, mass  lesion, acute infarction, or significant intracranial injury. Vascular: No hyperdense vessel or unexpected calcification. Skull: No acute calvarial abnormality. Sinuses/Orbits: No acute findings Other: None IMPRESSION: No acute intracranial abnormality. Electronically Signed   By: Rolm Baptise M.D.   On: 03/20/2021 21:26    Procedures Procedures   Medications Ordered in ED Medications  ketorolac (TORADOL) injection 30 mg (has no administration in time range)  acetaminophen (TYLENOL) tablet 1,000 mg (has no administration in time range)     ED Course  I have reviewed the triage vital signs and the nursing notes.  Pertinent labs & imaging results that were available during my care of the patient were reviewed by me and considered in my medical decision making (see chart for details).   I suspect the pain is chronic in nature given the patient's history.  Alternate tylenol and ibuprofen.  Patient has been informed verbally as well as  in print on the discharge paperwork of the pulmonary nodule incidentally found on CT scan.      Amy [name] was evaluated in Emergency Department on 03/20/2021 for the symptoms described in the history of present illness. She was evaluated in the context of the global COVID-19 pandemic, which necessitated consideration that the patient might be at risk for infection with the SARS-CoV-2 virus that causes COVID-19. Institutional protocols and algorithms that pertain to the evaluation of patients at risk for COVID-19 are in a state of rapid change based on information released by regulatory bodies including the CDC and federal and state organizations. These policies and algorithms were followed during the patient's care in the ED.  Final Clinical Impression(s) / ED Diagnoses Final diagnoses:  Fall    Return for intractable cough, coughing up blood, fevers > 100.4 unrelieved by medication,  shortness of breath, intractable vomiting, chest pain, shortness of breath, weakness, numbness, changes in speech, facial asymmetry, abdominal pain, passing out, Inability to tolerate liquids or food, cough, altered mental status or any concerns. No signs of systemic illness or infection. The patient is nontoxic-appearing on exam and vital signs are within normal limits. I have reviewed the triage vital signs and the nursing notes. Pertinent labs & imaging results that were available during my care of the patient were reviewed by me and considered in my medical decision making (see chart for details). After history, exam, and medical workup I feel the patient has been appropriately medically screened and is safe for discharge home. Pertinent diagnoses were discussed with the patient. Patient was given return precautions.  Rx / DC Orders ED Discharge Orders     None        Tyriq Moragne, MD 03/21/21 0263

## 2021-03-20 NOTE — ED Provider Notes (Signed)
Emergency Medicine Provider Triage Evaluation Note  Amy [name] , a 75 y.o. female  was evaluated in triage.  Pt complains of head pain. States she fell on 6/18 and hit her head, shoulder and back. She also c/o right hip pain since the fall. pain has been getting worse since onset. She has multiple other complaints  Review of Systems  Positive: Headache ,right hip pain Negative: vomiting  Physical Exam  BP (!) 139/106 (BP Location: Left Arm)   Pulse 89   Temp 98.1 F (36.7 C)   Resp 18   SpO2 100%  Gen:   Awake, no distress   Resp:  Normal effort  MSK:   Moves extremities without difficulty   Medical Decision Making  Medically screening exam initiated at 7:47 PM.  Appropriate orders placed.  Amy [name] was informed that the remainder of the evaluation will be completed by another provider, this initial triage assessment does not replace that evaluation, and the importance of remaining in the ED until their evaluation is complete.     Bishop Dublin 03/20/21 1957    Hayden Rasmussen, MD 03/21/21 1026

## 2021-03-20 NOTE — ED Triage Notes (Signed)
Patient fell last 03/11/21 , hit her head and landed on her right side against the floor , no LOC , reports right temporal headache " throbbing" , right hip and RLQ pain this week .

## 2021-03-21 DIAGNOSIS — R109 Unspecified abdominal pain: Secondary | ICD-10-CM | POA: Diagnosis not present

## 2021-03-21 MED ORDER — ACETAMINOPHEN 500 MG PO TABS
1000.0000 mg | ORAL_TABLET | Freq: Once | ORAL | Status: AC
  Administered 2021-03-21: 1000 mg via ORAL
  Filled 2021-03-21: qty 2

## 2021-03-21 MED ORDER — KETOROLAC TROMETHAMINE 60 MG/2ML IM SOLN
30.0000 mg | Freq: Once | INTRAMUSCULAR | Status: AC
  Administered 2021-03-21: 30 mg via INTRAMUSCULAR
  Filled 2021-03-21: qty 2

## 2021-03-22 DIAGNOSIS — R911 Solitary pulmonary nodule: Secondary | ICD-10-CM | POA: Diagnosis not present

## 2021-03-22 DIAGNOSIS — G44319 Acute post-traumatic headache, not intractable: Secondary | ICD-10-CM | POA: Diagnosis not present

## 2021-03-22 DIAGNOSIS — G8929 Other chronic pain: Secondary | ICD-10-CM | POA: Diagnosis not present

## 2021-03-22 DIAGNOSIS — W19XXXD Unspecified fall, subsequent encounter: Secondary | ICD-10-CM | POA: Diagnosis not present

## 2021-03-22 DIAGNOSIS — M545 Low back pain, unspecified: Secondary | ICD-10-CM | POA: Diagnosis not present

## 2021-03-28 DIAGNOSIS — M546 Pain in thoracic spine: Secondary | ICD-10-CM | POA: Diagnosis not present

## 2021-03-28 DIAGNOSIS — Z6831 Body mass index (BMI) 31.0-31.9, adult: Secondary | ICD-10-CM | POA: Diagnosis not present

## 2021-03-28 DIAGNOSIS — I10 Essential (primary) hypertension: Secondary | ICD-10-CM | POA: Diagnosis not present

## 2021-03-28 DIAGNOSIS — M545 Low back pain, unspecified: Secondary | ICD-10-CM | POA: Diagnosis not present

## 2021-03-30 ENCOUNTER — Ambulatory Visit
Admission: RE | Admit: 2021-03-30 | Discharge: 2021-03-30 | Disposition: A | Discharge status: Home / Self Care | Payer: Medicare Other | Source: Ambulatory Visit | Attending: Family Medicine | Admitting: Family Medicine

## 2021-03-30 ENCOUNTER — Other Ambulatory Visit: Payer: Self-pay

## 2021-03-30 ENCOUNTER — Ambulatory Visit: Payer: Medicare Other

## 2021-03-30 ENCOUNTER — Other Ambulatory Visit: Payer: Self-pay | Admitting: Student

## 2021-03-30 DIAGNOSIS — M545 Low back pain, unspecified: Secondary | ICD-10-CM

## 2021-03-30 DIAGNOSIS — N644 Mastodynia: Secondary | ICD-10-CM

## 2021-03-30 DIAGNOSIS — R922 Inconclusive mammogram: Secondary | ICD-10-CM | POA: Diagnosis not present

## 2021-04-04 DIAGNOSIS — R519 Headache, unspecified: Secondary | ICD-10-CM | POA: Diagnosis not present

## 2021-04-04 DIAGNOSIS — M6281 Muscle weakness (generalized): Secondary | ICD-10-CM | POA: Diagnosis not present

## 2021-04-04 DIAGNOSIS — R262 Difficulty in walking, not elsewhere classified: Secondary | ICD-10-CM | POA: Diagnosis not present

## 2021-04-04 DIAGNOSIS — Z9181 History of falling: Secondary | ICD-10-CM | POA: Diagnosis not present

## 2021-04-06 DIAGNOSIS — R519 Headache, unspecified: Secondary | ICD-10-CM | POA: Diagnosis not present

## 2021-04-06 DIAGNOSIS — M6281 Muscle weakness (generalized): Secondary | ICD-10-CM | POA: Diagnosis not present

## 2021-04-06 DIAGNOSIS — Z9181 History of falling: Secondary | ICD-10-CM | POA: Diagnosis not present

## 2021-04-06 DIAGNOSIS — R262 Difficulty in walking, not elsewhere classified: Secondary | ICD-10-CM | POA: Diagnosis not present

## 2021-04-11 DIAGNOSIS — Z9181 History of falling: Secondary | ICD-10-CM | POA: Diagnosis not present

## 2021-04-11 DIAGNOSIS — R519 Headache, unspecified: Secondary | ICD-10-CM | POA: Diagnosis not present

## 2021-04-11 DIAGNOSIS — R262 Difficulty in walking, not elsewhere classified: Secondary | ICD-10-CM | POA: Diagnosis not present

## 2021-04-11 DIAGNOSIS — M6281 Muscle weakness (generalized): Secondary | ICD-10-CM | POA: Diagnosis not present

## 2021-04-12 ENCOUNTER — Ambulatory Visit
Admission: RE | Admit: 2021-04-12 | Discharge: 2021-04-12 | Disposition: A | Discharge status: Home / Self Care | Payer: Medicare Other | Source: Ambulatory Visit | Attending: Student | Admitting: Student

## 2021-04-12 DIAGNOSIS — M48061 Spinal stenosis, lumbar region without neurogenic claudication: Secondary | ICD-10-CM | POA: Diagnosis not present

## 2021-04-12 DIAGNOSIS — M545 Low back pain, unspecified: Secondary | ICD-10-CM | POA: Diagnosis not present

## 2021-04-12 DIAGNOSIS — M40204 Unspecified kyphosis, thoracic region: Secondary | ICD-10-CM | POA: Diagnosis not present

## 2021-04-12 DIAGNOSIS — M5124 Other intervertebral disc displacement, thoracic region: Secondary | ICD-10-CM | POA: Diagnosis not present

## 2021-04-12 DIAGNOSIS — R600 Localized edema: Secondary | ICD-10-CM | POA: Diagnosis not present

## 2021-04-12 DIAGNOSIS — M4804 Spinal stenosis, thoracic region: Secondary | ICD-10-CM | POA: Diagnosis not present

## 2021-04-13 DIAGNOSIS — R519 Headache, unspecified: Secondary | ICD-10-CM | POA: Diagnosis not present

## 2021-04-13 DIAGNOSIS — M6281 Muscle weakness (generalized): Secondary | ICD-10-CM | POA: Diagnosis not present

## 2021-04-13 DIAGNOSIS — R262 Difficulty in walking, not elsewhere classified: Secondary | ICD-10-CM | POA: Diagnosis not present

## 2021-04-13 DIAGNOSIS — Z9181 History of falling: Secondary | ICD-10-CM | POA: Diagnosis not present

## 2021-04-17 DIAGNOSIS — R262 Difficulty in walking, not elsewhere classified: Secondary | ICD-10-CM | POA: Diagnosis not present

## 2021-04-17 DIAGNOSIS — Z9181 History of falling: Secondary | ICD-10-CM | POA: Diagnosis not present

## 2021-04-17 DIAGNOSIS — M6281 Muscle weakness (generalized): Secondary | ICD-10-CM | POA: Diagnosis not present

## 2021-04-17 DIAGNOSIS — R519 Headache, unspecified: Secondary | ICD-10-CM | POA: Diagnosis not present

## 2021-04-20 DIAGNOSIS — C7951 Secondary malignant neoplasm of bone: Secondary | ICD-10-CM | POA: Diagnosis not present

## 2021-04-24 ENCOUNTER — Telehealth: Payer: Self-pay | Admitting: Hematology

## 2021-04-24 NOTE — Telephone Encounter (Signed)
Received a new pt referral from Dr. Saintclair Halsted for bone mets. [name] has been cld and scheduled to see Dr. Irene Limbo on 8/4 at 1pm. Pt aware to arrive 20 minutes early.

## 2021-04-25 ENCOUNTER — Telehealth: Payer: Self-pay | Admitting: Neurology

## 2021-04-25 DIAGNOSIS — G4733 Obstructive sleep apnea (adult) (pediatric): Secondary | ICD-10-CM

## 2021-04-25 NOTE — Telephone Encounter (Signed)
Pt called, CPAP machine has stop working. [place], said need to contact physician for a prescription for a new machine. Would like a call from the nurse.

## 2021-04-26 NOTE — Telephone Encounter (Signed)
I spoke with [employer] and was advised that pt's insurance will not pay for a new machine until it has been 5 years since she received the current machine. She originally received it June 2020.  I was advised if the data collection is an issue they can do a download there.  If the machine is physically not working, they would need an order to troubleshoot and then provide a loaner machine until patient is eligible for a new machine.  I called the patient and discussed.  She stated the machine quit working 3 days ago.  She has checked other sockets to plug it into and has "hit the button", put it on, and it is not working. She also stated she has used it more than she hasn't. We discussed the insurance requirements as noted above.   I let her know I would send a message to the nurse practitioner and give her a call back with an update.  Download also pulled for Amy NP.

## 2021-04-26 NOTE — Addendum Note (Signed)
Addended by: Debbora Presto L on: 04/26/2021 04:38 PM   Modules accepted: Orders
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
## 2021-04-26 NOTE — Telephone Encounter (Signed)
I spoke with the patient and let her know the orders have been written and sent over to her DME company. Advised pt to call [employer] tomorrow and see if they can pick up her machine to troubleshoot or if someone she knows could help her get it to the company as she states she doesn't drive anymore. She verbalized understanding and appreciation.   Secure message regarding order sent to [employer].

## 2021-04-27 ENCOUNTER — Other Ambulatory Visit: Payer: Self-pay

## 2021-04-27 ENCOUNTER — Inpatient Hospital Stay: Payer: Medicare Other | Attending: Hematology | Admitting: Hematology

## 2021-04-27 ENCOUNTER — Inpatient Hospital Stay: Payer: Medicare Other

## 2021-04-27 VITALS — BP 178/83 | HR 82 | Temp 98.7°F | Resp 19 | Ht 65.0 in | Wt 295.0 lb

## 2021-04-27 DIAGNOSIS — Z808 Family history of malignant neoplasm of other organs or systems: Secondary | ICD-10-CM | POA: Diagnosis not present

## 2021-04-27 DIAGNOSIS — C7951 Secondary malignant neoplasm of bone: Secondary | ICD-10-CM | POA: Insufficient documentation

## 2021-04-27 DIAGNOSIS — Z87891 Personal history of nicotine dependence: Secondary | ICD-10-CM | POA: Insufficient documentation

## 2021-04-27 DIAGNOSIS — Z90710 Acquired absence of both cervix and uterus: Secondary | ICD-10-CM | POA: Insufficient documentation

## 2021-04-27 DIAGNOSIS — C801 Malignant (primary) neoplasm, unspecified: Secondary | ICD-10-CM

## 2021-04-27 LAB — CMP (CANCER CENTER ONLY)
ALT: 16 U/L (ref 0–44)
AST: 16 U/L (ref 15–41)
Albumin: 3.5 g/dL (ref 3.5–5.0)
Alkaline Phosphatase: 124 U/L (ref 38–126)
Anion gap: 10 (ref 5–15)
BUN: 17 mg/dL (ref 8–23)
CO2: 26 mmol/L (ref 22–32)
Calcium: 9.6 mg/dL (ref 8.9–10.3)
Chloride: 104 mmol/L (ref 98–111)
Creatinine: 1.06 mg/dL — ABNORMAL HIGH (ref 0.44–1.00)
GFR, Estimated: 55 mL/min — ABNORMAL LOW (ref >60)
Glucose, Bld: 102 mg/dL — ABNORMAL HIGH (ref 70–99)
Potassium: 4 mmol/L (ref 3.5–5.1)
Sodium: 140 mmol/L (ref 135–145)
Total Bilirubin: 0.3 mg/dL (ref 0.3–1.2)
Total Protein: 7.5 g/dL (ref 6.5–8.1)

## 2021-04-27 LAB — CBC WITH DIFFERENTIAL/PLATELET
Abs Immature Granulocytes: 0.02 10*3/uL (ref 0.00–0.07)
Basophils Absolute: 0 10*3/uL (ref 0.0–0.1)
Basophils Relative: 0 %
Eosinophils Absolute: 0.1 10*3/uL (ref 0.0–0.5)
Eosinophils Relative: 2 %
HCT: 39 % (ref 36.0–46.0)
Hemoglobin: 12.8 g/dL (ref 12.0–15.0)
Immature Granulocytes: 0 %
Lymphocytes Relative: 37 %
Lymphs Abs: 1.9 10*3/uL (ref 0.7–4.0)
MCH: 30.7 pg (ref 26.0–34.0)
MCHC: 32.8 g/dL (ref 30.0–36.0)
MCV: 93.5 fL (ref 80.0–100.0)
Monocytes Absolute: 0.6 10*3/uL (ref 0.1–1.0)
Monocytes Relative: 12 %
Neutro Abs: 2.4 10*3/uL (ref 1.7–7.7)
Neutrophils Relative %: 49 %
Platelets: 287 10*3/uL (ref 150–400)
RBC: 4.17 MIL/uL (ref 3.87–5.11)
RDW: 14.9 % (ref 11.5–15.5)
WBC: 5.1 10*3/uL (ref 4.0–10.5)
nRBC: 0 % (ref 0.0–0.2)

## 2021-04-27 LAB — LACTATE DEHYDROGENASE: LDH: 171 U/L (ref 98–192)

## 2021-04-27 LAB — C-REACTIVE PROTEIN: CRP: 0.8 mg/dL (ref <1.0)

## 2021-04-27 LAB — SEDIMENTATION RATE: Sed Rate: 38 mm/hr — ABNORMAL HIGH (ref 0–22)

## 2021-04-28 LAB — KAPPA/LAMBDA LIGHT CHAINS
Kappa free light chain: 26.5 mg/L — ABNORMAL HIGH (ref 3.3–19.4)
Kappa, lambda light chain ratio: 1.42 (ref 0.26–1.65)
Lambda free light chains: 18.7 mg/L (ref 5.7–26.3)

## 2021-04-30 LAB — BETA 2 MICROGLOBULIN, SERUM: Beta-2 Microglobulin: 1.7 mg/L (ref 0.6–2.4)

## 2021-05-01 LAB — MULTIPLE MYELOMA PANEL, SERUM
Albumin SerPl Elph-Mcnc: 3.4 g/dL (ref 2.9–4.4)
Albumin/Glob SerPl: 1.1 (ref 0.7–1.7)
Alpha 1: 0.2 g/dL (ref 0.0–0.4)
Alpha2 Glob SerPl Elph-Mcnc: 0.6 g/dL (ref 0.4–1.0)
B-Globulin SerPl Elph-Mcnc: 1.3 g/dL (ref 0.7–1.3)
Gamma Glob SerPl Elph-Mcnc: 1.3 g/dL (ref 0.4–1.8)
Globulin, Total: 3.3 g/dL (ref 2.2–3.9)
IgA: 432 mg/dL — ABNORMAL HIGH (ref 64–422)
IgG (Immunoglobin G), Serum: 1271 mg/dL (ref 586–1602)
IgM (Immunoglobulin M), Srm: 154 mg/dL (ref 26–217)
Total Protein ELP: 6.7 g/dL (ref 6.0–8.5)

## 2021-05-02 ENCOUNTER — Other Ambulatory Visit: Payer: Self-pay | Admitting: Hematology and Oncology

## 2021-05-02 MED ORDER — LORAZEPAM 1 MG PO TABS
1.0000 mg | ORAL_TABLET | ORAL | 0 refills | Status: DC
Start: 2021-05-02 — End: 2022-03-02

## 2021-05-05 ENCOUNTER — Telehealth: Payer: Self-pay | Admitting: Hematology

## 2021-05-05 NOTE — Telephone Encounter (Signed)
Rescheduled upcoming appointment due to provider's emergency. Patient is aware of changes. 

## 2021-05-11 ENCOUNTER — Other Ambulatory Visit: Payer: Self-pay

## 2021-05-11 ENCOUNTER — Ambulatory Visit (HOSPITAL_COMMUNITY)
Admission: RE | Admit: 2021-05-11 | Discharge: 2021-05-11 | Disposition: A | Discharge status: Home / Self Care | Payer: Medicare Other | Source: Ambulatory Visit | Attending: Hematology | Admitting: Hematology

## 2021-05-11 DIAGNOSIS — R911 Solitary pulmonary nodule: Secondary | ICD-10-CM | POA: Diagnosis not present

## 2021-05-11 DIAGNOSIS — C7951 Secondary malignant neoplasm of bone: Secondary | ICD-10-CM | POA: Diagnosis not present

## 2021-05-11 DIAGNOSIS — C801 Malignant (primary) neoplasm, unspecified: Secondary | ICD-10-CM | POA: Diagnosis not present

## 2021-05-11 LAB — GLUCOSE, CAPILLARY: Glucose-Capillary: 98 mg/dL (ref 70–99)

## 2021-05-11 MED ORDER — FLUDEOXYGLUCOSE F - 18 (FDG) INJECTION
14.6400 | Freq: Once | INTRAVENOUS | Status: AC
  Administered 2021-05-11: 14.64 via INTRAVENOUS

## 2021-05-12 ENCOUNTER — Ambulatory Visit: Payer: Medicare Other | Admitting: Hematology

## 2021-05-17 ENCOUNTER — Inpatient Hospital Stay: Payer: Medicare Other | Admitting: Hematology

## 2021-05-19 ENCOUNTER — Other Ambulatory Visit: Payer: Self-pay

## 2021-05-19 MED ORDER — FUROSEMIDE 20 MG PO TABS: 20.0000 mg | ORAL_TABLET | Freq: Every day | ORAL | 3 refills | Status: DC | PRN

## 2021-05-23 DIAGNOSIS — H5789 Other specified disorders of eye and adnexa: Secondary | ICD-10-CM | POA: Diagnosis not present

## 2021-05-23 DIAGNOSIS — R918 Other nonspecific abnormal finding of lung field: Secondary | ICD-10-CM | POA: Diagnosis not present

## 2021-05-23 DIAGNOSIS — M961 Postlaminectomy syndrome, not elsewhere classified: Secondary | ICD-10-CM | POA: Diagnosis not present

## 2021-05-23 DIAGNOSIS — G8929 Other chronic pain: Secondary | ICD-10-CM | POA: Diagnosis not present

## 2021-05-23 DIAGNOSIS — Z6831 Body mass index (BMI) 31.0-31.9, adult: Secondary | ICD-10-CM | POA: Diagnosis not present

## 2021-05-23 DIAGNOSIS — R0789 Other chest pain: Secondary | ICD-10-CM | POA: Diagnosis not present

## 2021-05-23 DIAGNOSIS — R5383 Other fatigue: Secondary | ICD-10-CM | POA: Diagnosis not present

## 2021-05-24 IMAGING — CT CT ABD-PELV W/ CM
1 of 3 series · 12 of 32 positions shown, 18 images · IV contrast (iopamidol)
Comparison: CT chest 07/07/2016, CT abdomen pelvis 08/01/2018 and
03/01/2005

CLINICAL DATA: Unspecified chronic abdominal pain.

EXAM:
CT ABDOMEN AND PELVIS WITH CONTRAST
TECHNIQUE: Multidetector CT imaging of the abdomen and pelvis was performed
using the standard protocol following bolus administration of
intravenous contrast.
CONTRAST:  100mL [id number] IOPAMIDOL ([id number]) INJECTION 61%

[Series 2: abd/pelvis w/cm · axial · 0.98mm/px · z∈[-425,-25]mm · 12 of 96 slices shown, 18 images]
[im 8/96  soft-tissue]
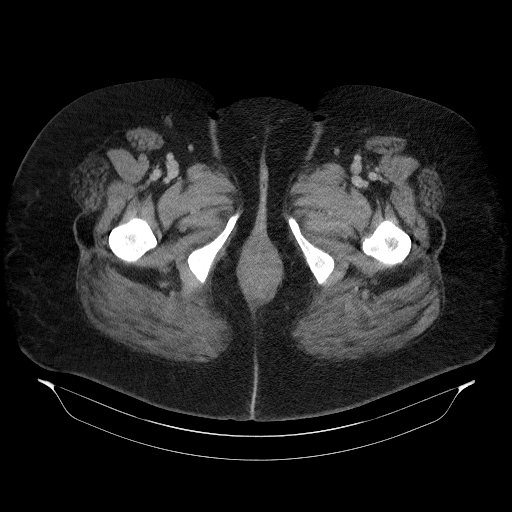
[im 8/96  bone]
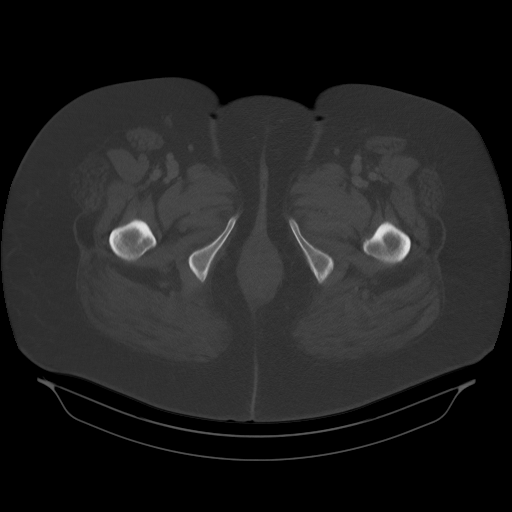
[im 15/96  soft-tissue]
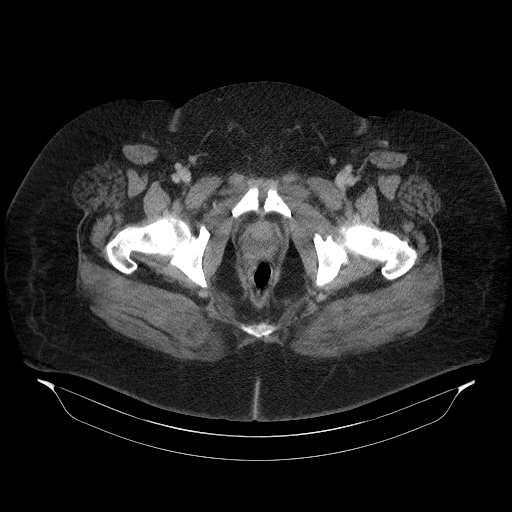
[im 22/96  soft-tissue]
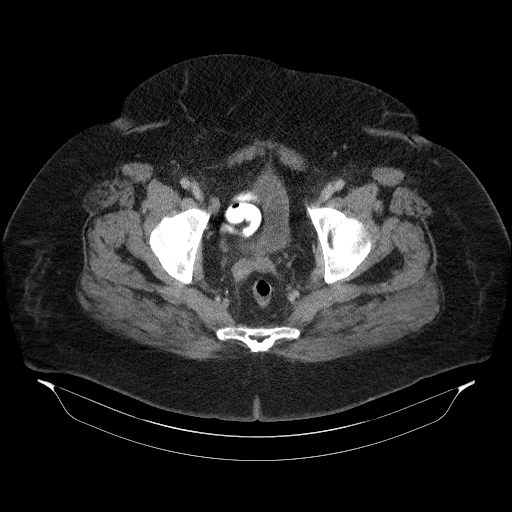
[im 30/96  soft-tissue]
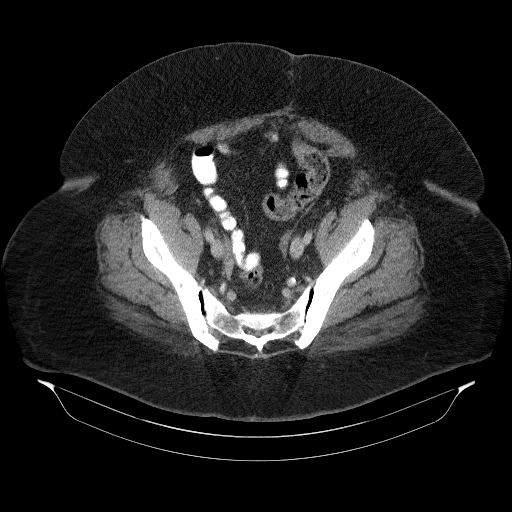
[im 37/96  soft-tissue]
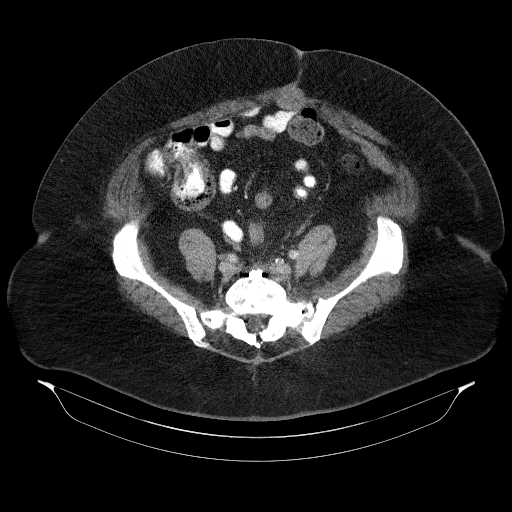
[im 44/96  soft-tissue]
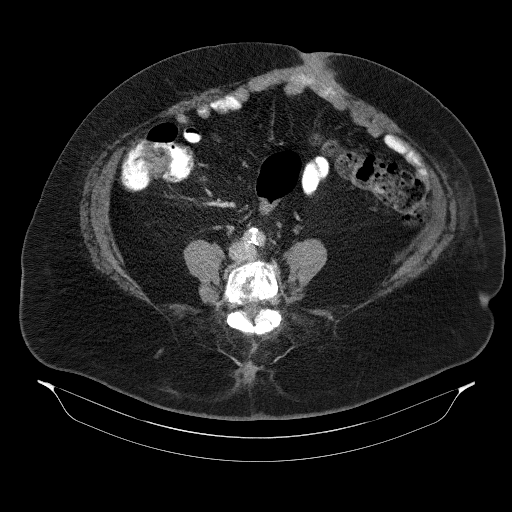
[im 52/96  soft-tissue]
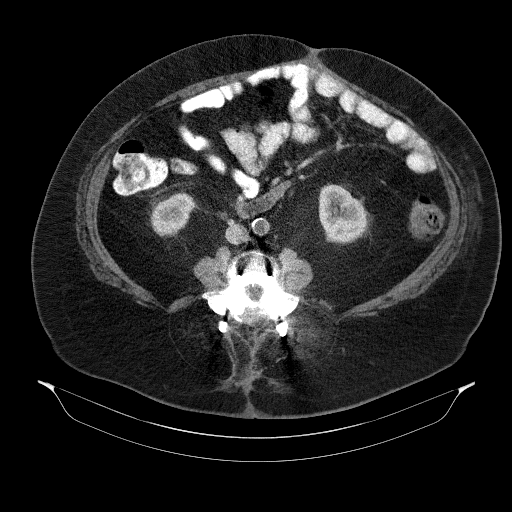
[im 59/96  soft-tissue]
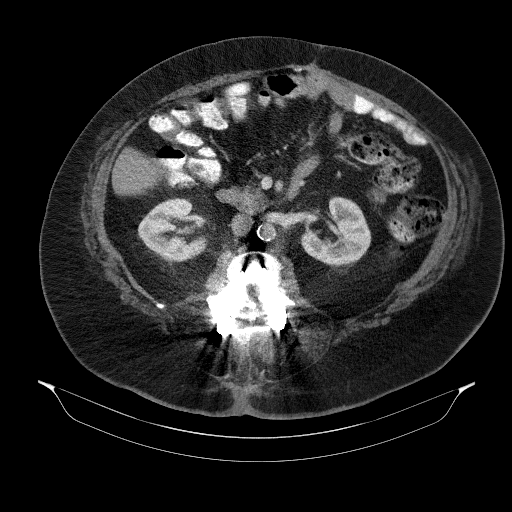
[im 66/96  soft-tissue]
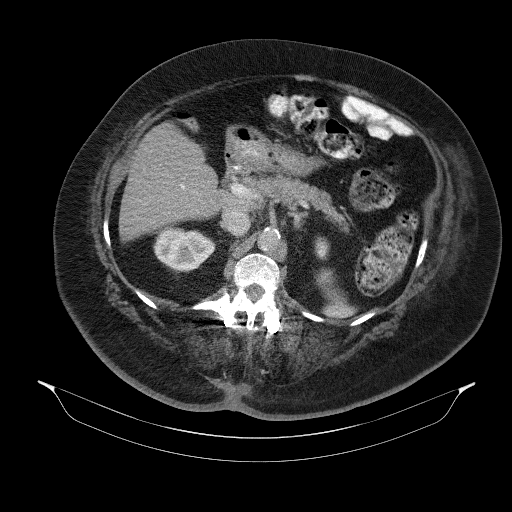
[im 66/96  lung]
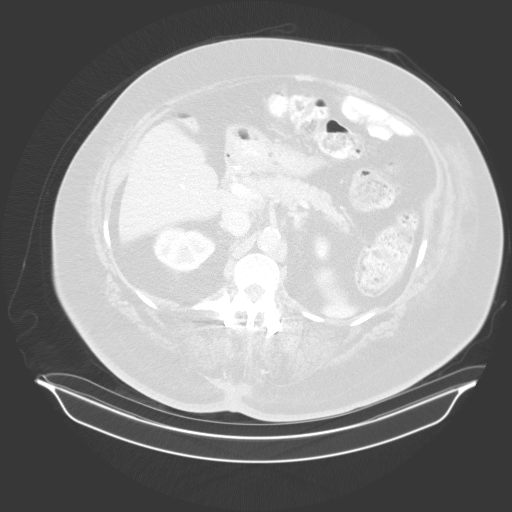
[im 66/96  bone]
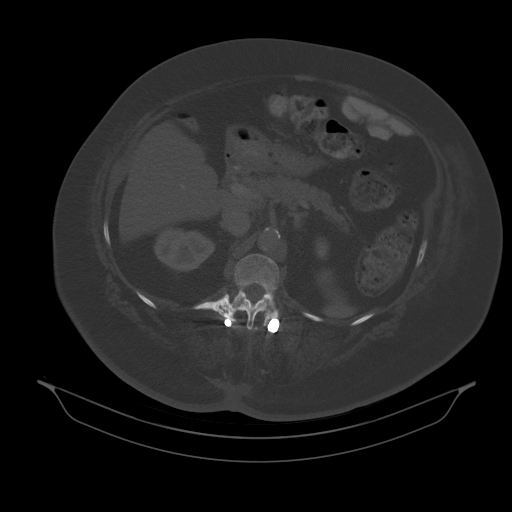
[im 74/96  soft-tissue]
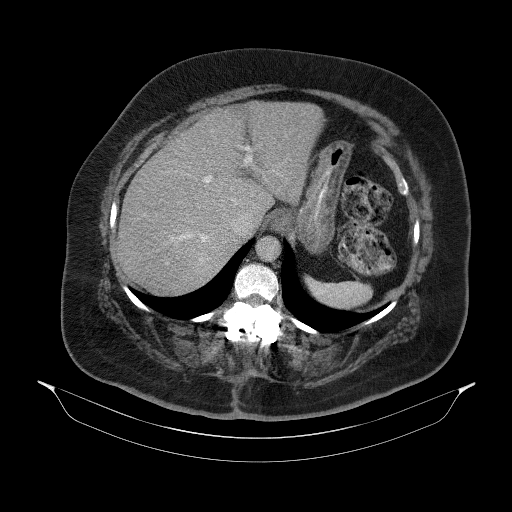
[im 74/96  lung]
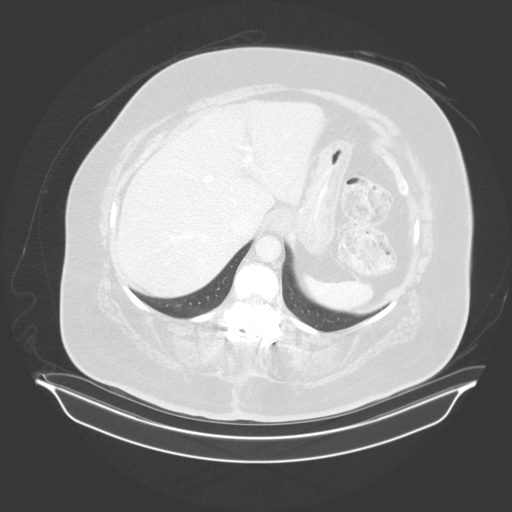
[im 81/96  soft-tissue]
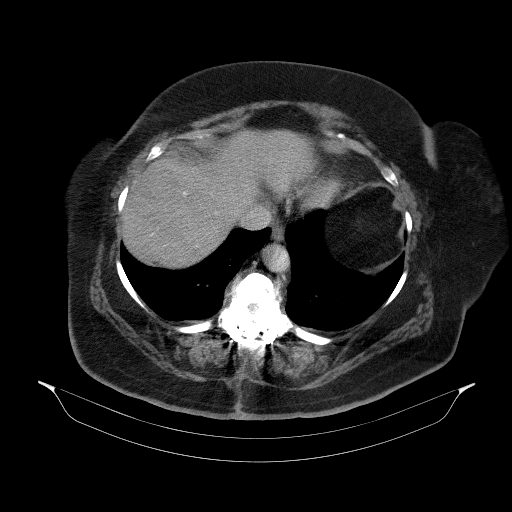
[im 81/96  lung]
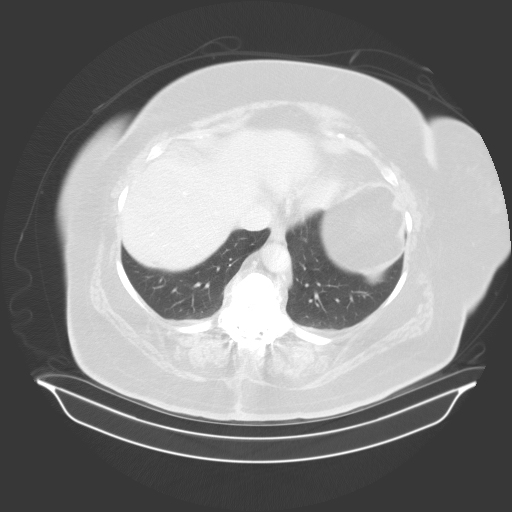
[im 88/96  soft-tissue]
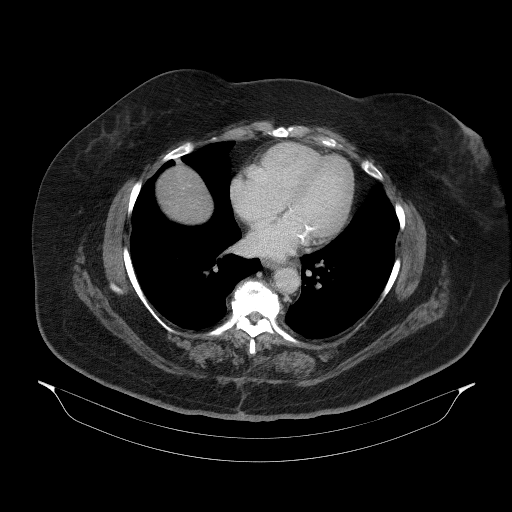
[im 88/96  lung]
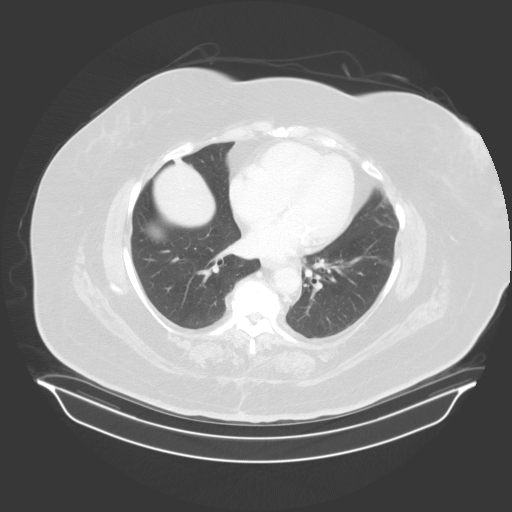

[12 of 32 positions shown; findings below may reference images not displayed]

FINDINGS: Lower chest: A a 10 mm ground-glass pulmonary nodule is seen within
the visualized right lower lobe, stable since remote prior
examination of 07/07/2016, safely considered benign given its
stability over time. The visualized lung bases are otherwise clear.
Moderate coronary artery calcification. Global cardiac size within
normal limits.

Hepatobiliary: Status post cholecystectomy. Mild hepatic steatosis.
No enhancing liver lesion. No intra or extrahepatic biliary ductal
dilation.

Pancreas: Unremarkable

Spleen: Unremarkable

Adrenals/Urinary Tract: 14 mm left adrenal nodule is stable since
remote prior examination of 03/01/2005, most in keeping with a
benign adrenal adenoma given its stability over time. The right
adrenal gland is unremarkable. The kidneys are normal. The bladder
is unremarkable.

Stomach/Bowel: The stomach, small bowel, and large bowel are
unremarkable. Appendix absent. No free intraperitoneal gas or fluid.

Vascular/Lymphatic: Moderate aortoiliac atherosclerotic
calcification. No aortic aneurysm. No pathologic adenopathy within
the abdomen and pelvis.

Reproductive: Status post hysterectomy. No adnexal masses.

Other: No abdominal wall hernia.  The rectum is unremarkable.

Musculoskeletal: Thoracolumbar fusion with instrumentation has been
performed with posterior decompression of L1 and L3. No acute bone
abnormality. No lytic or blastic bone lesion is identified.
IMPRESSION: No radiographic evidence of acute cardiopulmonary disease. No
radiographic explanation for the patient's reported symptoms.

10 mm right lower lobe ground-glass pulmonary nodule, safely
considered benign given its stability over time.

Mild hepatic steatosis.

14 mm left adrenal adenoma.

Aortic Atherosclerosis (KUZEH-CLA.A).

## 2021-06-06 DIAGNOSIS — M48062 Spinal stenosis, lumbar region with neurogenic claudication: Secondary | ICD-10-CM | POA: Diagnosis not present

## 2021-06-06 DIAGNOSIS — I10 Essential (primary) hypertension: Secondary | ICD-10-CM | POA: Diagnosis not present

## 2021-06-06 DIAGNOSIS — Z6833 Body mass index (BMI) 33.0-33.9, adult: Secondary | ICD-10-CM | POA: Diagnosis not present

## 2021-06-08 DIAGNOSIS — M5414 Radiculopathy, thoracic region: Secondary | ICD-10-CM | POA: Diagnosis not present

## 2021-06-28 DIAGNOSIS — M961 Postlaminectomy syndrome, not elsewhere classified: Secondary | ICD-10-CM | POA: Diagnosis not present

## 2021-06-28 DIAGNOSIS — I739 Peripheral vascular disease, unspecified: Secondary | ICD-10-CM | POA: Diagnosis not present

## 2021-07-06 ENCOUNTER — Emergency Department (HOSPITAL_COMMUNITY)
Admission: EM | Admit: 2021-07-06 | Discharge: 2021-07-07 | Disposition: A | Discharge status: Home / Self Care | Payer: Medicare Other | Attending: Emergency Medicine | Admitting: Emergency Medicine

## 2021-07-06 ENCOUNTER — Other Ambulatory Visit: Payer: Self-pay

## 2021-07-06 ENCOUNTER — Emergency Department (HOSPITAL_COMMUNITY): Payer: Medicare Other

## 2021-07-06 ENCOUNTER — Encounter (HOSPITAL_COMMUNITY): Payer: Self-pay | Admitting: Pharmacy Technician

## 2021-07-06 DIAGNOSIS — W1839XA Other fall on same level, initial encounter: Secondary | ICD-10-CM | POA: Insufficient documentation

## 2021-07-06 DIAGNOSIS — G894 Chronic pain syndrome: Secondary | ICD-10-CM | POA: Insufficient documentation

## 2021-07-06 DIAGNOSIS — J45909 Unspecified asthma, uncomplicated: Secondary | ICD-10-CM | POA: Insufficient documentation

## 2021-07-06 DIAGNOSIS — R531 Weakness: Secondary | ICD-10-CM | POA: Diagnosis not present

## 2021-07-06 DIAGNOSIS — I10 Essential (primary) hypertension: Secondary | ICD-10-CM | POA: Diagnosis not present

## 2021-07-06 DIAGNOSIS — W19XXXA Unspecified fall, initial encounter: Secondary | ICD-10-CM

## 2021-07-06 DIAGNOSIS — S4991XA Unspecified injury of right shoulder and upper arm, initial encounter: Secondary | ICD-10-CM | POA: Insufficient documentation

## 2021-07-06 DIAGNOSIS — M25512 Pain in left shoulder: Secondary | ICD-10-CM | POA: Insufficient documentation

## 2021-07-06 DIAGNOSIS — Z91040 Latex allergy status: Secondary | ICD-10-CM | POA: Diagnosis not present

## 2021-07-06 DIAGNOSIS — M797 Fibromyalgia: Secondary | ICD-10-CM | POA: Diagnosis not present

## 2021-07-06 DIAGNOSIS — Z79899 Other long term (current) drug therapy: Secondary | ICD-10-CM | POA: Insufficient documentation

## 2021-07-06 DIAGNOSIS — R519 Headache, unspecified: Secondary | ICD-10-CM | POA: Insufficient documentation

## 2021-07-06 DIAGNOSIS — Z87891 Personal history of nicotine dependence: Secondary | ICD-10-CM | POA: Insufficient documentation

## 2021-07-06 DIAGNOSIS — M25511 Pain in right shoulder: Secondary | ICD-10-CM

## 2021-07-06 LAB — CBC WITH DIFFERENTIAL/PLATELET
Abs Immature Granulocytes: 0.01 10*3/uL (ref 0.00–0.07)
Basophils Absolute: 0 10*3/uL (ref 0.0–0.1)
Basophils Relative: 1 %
Eosinophils Absolute: 0.1 10*3/uL (ref 0.0–0.5)
Eosinophils Relative: 2 %
HCT: 43.6 % (ref 36.0–46.0)
Hemoglobin: 13.9 g/dL (ref 12.0–15.0)
Immature Granulocytes: 0 %
Lymphocytes Relative: 43 %
Lymphs Abs: 1.9 10*3/uL (ref 0.7–4.0)
MCH: 31 pg (ref 26.0–34.0)
MCHC: 31.9 g/dL (ref 30.0–36.0)
MCV: 97.1 fL (ref 80.0–100.0)
Monocytes Absolute: 0.5 10*3/uL (ref 0.1–1.0)
Monocytes Relative: 13 %
Neutro Abs: 1.7 10*3/uL (ref 1.7–7.7)
Neutrophils Relative %: 41 %
Platelets: 341 10*3/uL (ref 150–400)
RBC: 4.49 MIL/uL (ref 3.87–5.11)
RDW: 14.5 % (ref 11.5–15.5)
WBC: 4.2 10*3/uL (ref 4.0–10.5)
nRBC: 0 % (ref 0.0–0.2)

## 2021-07-06 LAB — BASIC METABOLIC PANEL
Anion gap: 8 (ref 5–15)
BUN: 12 mg/dL (ref 8–23)
CO2: 30 mmol/L (ref 22–32)
Calcium: 9.3 mg/dL (ref 8.9–10.3)
Chloride: 100 mmol/L (ref 98–111)
Creatinine, Ser: 1.11 mg/dL — ABNORMAL HIGH (ref 0.44–1.00)
GFR, Estimated: 52 mL/min — ABNORMAL LOW (ref >60)
Glucose, Bld: 97 mg/dL (ref 70–99)
Potassium: 4.8 mmol/L (ref 3.5–5.1)
Sodium: 138 mmol/L (ref 135–145)

## 2021-07-06 MED ORDER — ACETAMINOPHEN 325 MG PO TABS: 650.0000 mg | ORAL_TABLET | Freq: Once | ORAL | Status: DC

## 2021-07-06 NOTE — ED Notes (Signed)
Pt stated pain is worsening. Pt stated the pain is now going to her legs. Pt stated her pain is an 8 on a scale of 1-10. Triage RN notified.

## 2021-07-06 NOTE — ED Triage Notes (Signed)
Pt here with reports of chest pain, headache, neck pain. Pt also states she fell a few days ago with R shoulder and R arm pain. Pt states she hurts all over her body.

## 2021-07-06 NOTE — ED Provider Notes (Signed)
Emergency Medicine Provider Triage Evaluation Note  Amy [name] , a 75 y.o. female with hx of fibromyalgia  was evaluated in triage.  Pt complains of total body pain and fatigue.  She also states she fell earlier today from her bed onto her right side.  She complains of pain specifically to the right shoulder and right upper arm and chest.  She rates it as moderate in severity.  She denies any loss of consciousness, trauma to the head, fever, or chills.  Review of Systems  Positive:  Negative: See above  Physical Exam  BP (!) 159/81 (BP Location: Right Wrist)   Pulse 74   Temp 98.6 F (37 C) (Oral)   Resp 15   SpO2 97%  Gen:   Awake, no distress   Resp:  Normal effort  MSK:   Moves extremities without difficulty  Other:  Right shoulder tenderness  Medical Decision Making  Medically screening exam initiated at 3:46 PM.  Appropriate orders placed.  Amy [name] was informed that the remainder of the evaluation will be completed by another provider, this initial triage assessment does not replace that evaluation, and the importance of remaining in the ED until their evaluation is complete.     Amy Bright [place], PA-C 07/06/21 1547    Tegeler, Gwenyth Allegra, MD 07/06/21 [phone]

## 2021-07-07 DIAGNOSIS — S4991XA Unspecified injury of right shoulder and upper arm, initial encounter: Secondary | ICD-10-CM | POA: Diagnosis not present

## 2021-07-07 MED ORDER — HYDROMORPHONE HCL 1 MG/ML IJ SOLN
2.0000 mg | Freq: Once | INTRAMUSCULAR | Status: AC
  Administered 2021-07-07: 2 mg via INTRAMUSCULAR
  Filled 2021-07-07: qty 2

## 2021-07-07 NOTE — Discharge Instructions (Signed)
Follow back up with your primary care doctor as well as neurosurgery.  For additional work-up.  Today without any acute findings.  For the right shoulder pain no evidence of any bony fracture.  Would follow-up either with sports medicine information provided above or the orthopedic surgeon of your choice.

## 2021-07-07 NOTE — ED Provider Notes (Signed)
[facility] EMERGENCY DEPARTMENT Provider Note   CSN: [id number] Arrival date & time: 07/06/21  1458     History No chief complaint on file.   Amy [name] is a 75 y.o. female.  Did not with a history of fibromyalgia and chronic pain.  Also has had multiple back surgeries.  Known to have chronic back pain gets steroid injections.  Followed by Dr. Windy Carina office.  Kind of seen there for the chronic pain treatment.  Patient's been struggling with some falls.  Patient had a fall on Tuesday.  Where she got out of bed and she been having trouble with some intermittent numbness of her right low extremity.  And that gave out and when she went down to the floor.  She was on the floor maybe for about 30 minutes before she get her self back up she was able to do that.  Following that she did have some pain in her right shoulder.  But started to get pain everywhere starting the next day.  Which led to her coming in here yesterday and unfortunately had a wait overnight.  Patient is just on tramadol for pain currently.  Her pain is currently worse because she has been out of any pain medicine.  X-rays were done of her chest and of her right shoulder without any obvious abnormalities may be a little bit of an AC separation.  But patient seems to have good range of motion of that right upper extremity.  Patient immediately following the fall denied any hitting her head.  Really had no significant pain other than her baseline pain.  Patient states that she is got a postconcussive syndrome.  Notes show that that goes back to November 2021.  Resulting in intermittent headaches.  Does have a headache currently.      Past Medical History:  Diagnosis Date   Abdominal pain of unknown etiology 02/05/2016   Anxiety    Arthritis    ddd- RA   Asthma    "sleeping asthma"   Chronic back pain    lumbar steroid injection's   Complication of anesthesia    Hard to wake up. Pt sts is took  3 days.   Constipation    Depression    Dysphonia    intermittent "voice changes"   Esophageal dysmotility    Fibromyalgia    Fibromyalgia    GERD (gastroesophageal reflux disease)    Glaucoma    Headache    High cholesterol    ? pt states her doctor told her to continue eating vegetables & fruit   History of DVT (deep vein thrombosis) 1989   LEFT UPPER ARM   History of hiatal hernia    History of MRSA infection 2011   Hypertension    Irregular heart rate    "years ago"   Low iron    Lumbago    Mild obstructive sleep apnea    per study 02-07-2006 - no cpap   Neuropathy    feet   Pelvic pain in female    Peripheral vascular disease ([place])    "poor circulation"   SBO (small bowel obstruction) ([place]) 06/2016   Seasonal allergies    Short of breath on exertion    Urinary frequency    Vaginal atrophy    Weakness    both hands and feet    Patient Active Problem List   Diagnosis Date Noted   Uncontrolled daytime somnolence 04/12/2019   Snoring 01/22/2019  Morbid obesity with body mass index of 45.0-49.9 in adult [facility]) 01/22/2019   OSA on CPAP NOT COMPLIANT 01/22/2019   Wound dehiscence 07/22/2016   Dyspnea 07/21/2016   Acute encephalopathy 07/21/2016   Anemia 07/21/2016   Pancreatitis 07/02/2016   SBO (small bowel obstruction) ([place]) 07/02/2016   Long term current use of opiate analgesic 06/26/2016   Long term prescription opiate use 06/26/2016   Opiate use 06/26/2016   Encounter for therapeutic drug level monitoring 06/26/2016   Encounter for pain management planning 06/26/2016   Chronic pain 06/26/2016   Disturbance of skin sensation 06/26/2016   Chronic low back pain (Location of Primary Source of Pain) (Bilateral) (R>L) 06/26/2016   Chronic hip pain (Location of Secondary source of pain) (Bilateral) (R>L) 06/26/2016   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) 06/26/2016   Chronic shoulder pain (Location of Tertiary source of pain) (Bilateral) (R>L) 06/26/2016    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) 06/26/2016   Chronic knee pain (Bilateral) (R>L) 06/26/2016   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) 06/26/2016   Chronic neck pain (Right) 06/26/2016   Lumbar spondylosis 06/26/2016   Lumbar facet syndrome 06/26/2016   Lumbar facet hypertrophy 06/26/2016   Epidural fibrosis 06/26/2016   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   Neurogenic pain 06/26/2016   Occipital headaches 06/26/2016   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   History of lumbar fusion 06/26/2016   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   Neuropathic pain of both legs 10/13/2015   Constipation 10/13/2015   Complete rotator cuff tear of left shoulder 05/05/2014   Depression 08/06/2013   Hypokalemia 08/06/2013   GERD (gastroesophageal reflux disease) 08/06/2013   Spinal stenosis of lumbar region 08/06/2013   Bladder spasm 08/06/2013   Morbid obesity ([place]) 02/03/2013   Lipoma of buttock s/p excision 02/20/2013 02/03/2013   DYSPNEA 01/23/2008   Obstructive sleep apnea 09/12/2007   HTN (hypertension) 09/12/2007   Allergic rhinitis 09/12/2007   SLEEPINESS 09/12/2007   ANGINA, HX OF 09/12/2007    Past Surgical History:  Procedure Laterality Date   abdominal adhesions removed     ABDOMINAL HYSTERECTOMY  1978   BACK SURGERY     BUNIONECTOMY Left 2008   CATARACT EXTRACTION Bilateral 2017   COLON SURGERY     COLONOSCOPY     CYSTO WITH HYDRODISTENSION N/A 07/12/2015   Procedure: CYSTOSCOPY/HYDRODISTENSION;  Surgeon: Bjorn Loser, MD;  Location: [facility];  Service: Urology;  Laterality: N/A;   DILATION AND CURETTAGE OF UTERUS     ESOPHAGEAL MANOMETRY N/A 11/22/2014   Procedure: ESOPHAGEAL MANOMETRY (EM);  Surgeon: Winfield Cunas., MD;  Location: [facility];  Service: Endoscopy;  Laterality: N/A;   ESOPHAGOGASTRODUODENOSCOPY N/A 07/15/2013   Procedure: ESOPHAGOGASTRODUODENOSCOPY (EGD);  Surgeon: Winfield Cunas., MD;  Location: [facility];  Service: Endoscopy;  Laterality: N/A;  need xray   EXPLORATORY LAPAROTOMY     EYE SURGERY Bilateral    cataract surgery with lens implants   HARDWARE REMOVAL N/A 01/02/2016   Procedure: Exploration of Lumbar Fusion,Removal of hardware Lumbar One-Two ;Redo Posterior Lumbar Fusion Lumbar One-Two;  Surgeon: Kary Kos, MD;  Location: [facility];  Service: Neurosurgery;  Laterality: N/A;   ingrown toenail removal     LAPAROSCOPIC CHOLECYSTECTOMY  01-11-2006   LAPAROSCOPY N/A 07/06/2016   Procedure: LAPAROSCOPY DIAGNOSTIC EMERGENT TO OPEN;  Surgeon: Greer Pickerel, MD;  Location: [facility];  Service: General;  Laterality: N/A;   LAPAROTOMY N/A 07/06/2016   Procedure: EXPLORATORY LAPAROTOMY LYSIS OF ADHESIONS  FOR 3 HOURS;  Surgeon: Greer Pickerel, MD;  Location: [facility];  Service: General;  Laterality: N/A;   lipoma removal     LUMBAR FUSION  2014   L4 -- L5   MASS EXCISION Left 02/20/2013   Procedure: EXCISION LEFT BUTTOCK  MASS;  Surgeon: Adin Hector, MD;  Location: [facility];  Service: General;  Laterality: Left;   NOSE SURGERY  2007   REMOVAL HARDWARE L4-L5/  BILATERAL LAMINECTOMY L2 - L5 AND FUSION  06-12-2011   SHOULDER OPEN ROTATOR CUFF REPAIR Left 05/05/2014   Procedure: OPEN ACROMIONECTOMY AND OPEN REPAIR OF ROTATOR CUFF, TISSUEMEND GRAFT WITH ANCHOR ;  Surgeon: Tobi Bastos, MD;  Location: [facility];  Service: Orthopedics;  Laterality: Left;   SPINE SURGERY     x6   TONSILLECTOMY       OB History   No obstetric history on file.     Family History  Problem Relation Age of Onset   Hypertension Mother    Cancer Mother        cervix   Kidney disease Mother    Osteoarthritis Mother    Diabetes Mother    Hypertension Sister    Cancer Sister        polyps   Kidney disease Brother    Cancer Daughter        leukemia   Hypertension Brother    Liver cancer Brother    Hypertension Father    Colon cancer Father    Colon polyps Father    Hypertension Brother    Liver cancer  Brother    Aneurysm Maternal Grandmother    Migraines Neg Hx    Headache Neg Hx     Social History   Tobacco Use   Smoking status: Former    Types: Cigarettes    Quit date: 07/10/1969    Years since quitting: 52.0   Smokeless tobacco: Never  Vaping Use   Vaping Use: Never used  Substance Use Topics   Alcohol use: Not Currently    Comment: rare, social   Drug use: No    Home Medications Prior to Admission medications   Medication Sig Start Date End Date Taking? Authorizing Provider  acetaminophen (TYLENOL) 500 MG tablet Take 1,000 mg by mouth every 6 (six) hours as needed for moderate pain.    Yes [provider]  atorvastatin (LIPITOR) 20 MG tablet Take 1 tablet (20 mg total) by mouth daily. 01/16/21 07/07/21 Yes Agbor-Etang, Aaron Edelman, MD  B Complex Vitamins (B COMPLEX PO) Take 1 tablet by mouth daily.   Yes [provider]  cloNIDine (CATAPRES) 0.1 MG tablet Take 1 tablet (0.1 mg total) by mouth 3 (three) times daily. 08/01/16  Yes Meuth, Brooke A, PA-C  diclofenac sodium (VOLTAREN) 1 % GEL Apply 4 g topically 4 (four) times daily. Patient taking differently: Apply 4 g topically 4 (four) times daily as needed (dry skin, rash). 06/23/19  Yes Stover, Titorya, DPM  diphenhydrAMINE (BENADRYL) 25 MG tablet Take 25 mg by mouth 2 (two) times daily as needed for allergies.   Yes [provider]  estradiol (ESTRACE) 0.1 MG/GM vaginal cream Place 1 Applicatorful vaginally 2 (two) times a week. Patient taking differently: Place 1 Applicatorful vaginally See admin instructions. Tuesday and thursday 08/02/16  Yes Meuth, Brooke A, PA-C  fluticasone (FLONASE) 50 MCG/ACT nasal spray Place 1 spray into both nostrils daily. 08/02/16  Yes Meuth, Brooke A, PA-C  furosemide (LASIX) 20 MG tablet Take 1 tablet (20  mg total) by mouth daily as needed for edema. 05/19/21 08/17/21 Yes Agbor-Etang, Aaron Edelman, MD  gabapentin (NEURONTIN) 600 MG tablet Take 600-1,200 mg by mouth 2 (two) times  daily. Take 600mg  in the AM and 1,200mg  in the PM 11/30/19  Yes [provider]  hydrALAZINE (APRESOLINE) 50 MG tablet Take 50 mg by mouth 3 (three) times daily.   Yes [provider]  LORazepam (ATIVAN) 1 MG tablet Take 1 tablet (1 mg total) by mouth as directed. Take 1 tablet 30-60 minutes prior to CT/MRI scan Patient taking differently: Take 1 mg by mouth once as needed (30-60 minutes prior to CT/MRI scan). 05/02/21  Yes Orson Slick, MD  Menthol, Topical Analgesic, (ICY HOT BACK EX) Apply 1 application topically 2 (two) times daily as needed (pain).   Yes [provider]  methocarbamol (ROBAXIN) 500 MG tablet Take 500 mg by mouth 4 (four) times daily as needed for muscle spasms. 03/28/21  Yes [provider]  metoprolol (LOPRESSOR) 50 MG tablet Take 1 tablet (50 mg total) by mouth 2 (two) times daily. 08/01/16  Yes Meuth, Brooke A, PA-C  pantoprazole (PROTONIX) 40 MG tablet Take 80 mg by mouth daily.  01/17/18  Yes [provider]  PARoxetine (PAXIL) 30 MG tablet Take 30 mg by mouth daily.   Yes [provider]  potassium chloride SA (K-DUR,KLOR-CON) 20 MEQ tablet Take 20 mEq by mouth daily.   Yes [provider]  traMADol (ULTRAM) 50 MG tablet Take 50 mg by mouth every 6 (six) hours as needed for moderate pain or severe pain. 06/28/21  Yes [provider]  vitamin E 180 MG (400 UNITS) capsule Take 400 Units by mouth daily.   Yes [provider]  hydrochlorothiazide (HYDRODIURIL) 12.5 MG tablet Take 12.5 mg by mouth daily. 05/08/19   [provider]  meloxicam (MOBIC) 7.5 MG tablet Take 1 tablet (7.5 mg total) by mouth daily. Patient not taking: No sig reported 07/28/20   Melvenia Beam, MD  triamcinolone ointment (KENALOG) 0.5 % Apply 1 application topically 2 (two) times daily. To rash to feet Patient not taking: No sig reported 06/23/19   Landis Martins, DPM    Allergies    Latex, Crestor [rosuvastatin  calcium], Penicillins, Tetanus toxoids, and Tomato  Review of Systems   Review of Systems  Constitutional:  Negative for chills and fever.  HENT:  Negative for ear pain and sore throat.   Eyes:  Negative for pain and visual disturbance.  Respiratory:  Negative for cough and shortness of breath.   Cardiovascular:  Negative for chest pain and palpitations.  Gastrointestinal:  Negative for abdominal pain and vomiting.  Genitourinary:  Negative for dysuria and hematuria.  Musculoskeletal:  Positive for back pain and myalgias. Negative for arthralgias.  Skin:  Negative for color change and rash.  Neurological:  Positive for headaches. Negative for seizures and syncope.  All other systems reviewed and are negative.  Physical Exam Updated Vital Signs BP (!) 186/97   Pulse (!) 102   Temp 98.6 F (37 C) (Oral)   Resp 15   SpO2 90%   Physical Exam Vitals and nursing note reviewed.  Constitutional:      General: She is not in acute distress.    Appearance: Normal appearance. She is well-developed. She is obese.  HENT:     Head: Normocephalic and atraumatic.  Eyes:     Extraocular Movements: Extraocular movements intact.     Conjunctiva/sclera: Conjunctivae normal.  Pupils: Pupils are equal, round, and reactive to light.  Cardiovascular:     Rate and Rhythm: Normal rate and regular rhythm.     Heart sounds: No murmur heard. Pulmonary:     Effort: Pulmonary effort is normal. No respiratory distress.     Breath sounds: Normal breath sounds.  Abdominal:     Palpations: Abdomen is soft.     Tenderness: There is no abdominal tenderness.  Musculoskeletal:        General: Tenderness present. Normal range of motion.     Cervical back: Normal range of motion and neck supple.     Comments: Some mild tenderness to palpation to right shoulder no obvious deformity.  Patient with actually very good range of motion of the shoulder and good range of motion elbow hand and wrist.  Radial pulses  2+.  Patient with pain with movement of both lower extremities.  But does not have any specific hip pain.  Also has back pain.  But nothing that is new.  Saw her typical chronic back pain  Skin:    General: Skin is warm and dry.     Capillary Refill: Capillary refill takes less than 2 seconds.  Neurological:     General: No focal deficit present.     Mental Status: She is alert and oriented to person, place, and time.     Cranial Nerves: No cranial nerve deficit.     Sensory: No sensory deficit.     Motor: Weakness present.     Comments: Patient without any lower extremity numbness at this point in time.  Is able to move all extremities.  It is associated with pain which seems to be chronic pain.     ED Results / Procedures / Treatments   Labs (all labs ordered are listed, but only abnormal results are displayed) Labs Reviewed  BASIC METABOLIC PANEL - Abnormal; Notable for the following components:      Result Value   Creatinine, Ser 1.11 (*)    GFR, Estimated 52 (*)    All other components within normal limits  CBC WITH DIFFERENTIAL/PLATELET    EKG EKG Interpretation  Date/Time:  Thursday July 06 2021 15:28:36 EDT Ventricular Rate:  72 PR Interval:  164 QRS Duration: 76 QT Interval:  378 QTC Calculation: 413 R Axis:   -24 Text Interpretation: Normal sinus rhythm Low voltage QRS Cannot rule out Anterior infarct , age undetermined Abnormal ECG No significant change since last tracing Confirmed by Fredia Sorrow [id number]) on 07/07/2021 8:13:25 AM  Radiology DG Chest 2 View  Result Date: 07/06/2021 CLINICAL DATA:  Left shoulder pain after fall EXAM: CHEST - 2 VIEW COMPARISON:  03/20/2021 chest radiograph. FINDINGS: Partially visualized bilateral posterior spinal fusion hardware extending inferiorly from the lower thoracic spine. Stable cardiomediastinal silhouette with normal heart size. No pneumothorax. No pleural effusion. Stable minimal linear scarring at the anterior lung  bases. No pulmonary edema. No acute consolidative airspace disease. No displaced fractures in the visualized chest. IMPRESSION: No active cardiopulmonary disease. Electronically Signed   By: Ilona Sorrel M.D.   On: 07/06/2021 16:44   DG Shoulder Right  Result Date: 07/06/2021 CLINICAL DATA:  Right shoulder pain, fell EXAM: RIGHT SHOULDER - 2+ VIEW COMPARISON:  06/27/2016 FINDINGS: Internal rotation, external rotation, transscapular, and axillary views of the right shoulder are obtained. No acute fracture. Mild superior subluxation of the distal clavicle relative to the acromion process on the transscapular view, which could reflect mild shoulder separation. There is  mild glenohumeral osteoarthritis. There is prominent hypertrophy of the acromioclavicular joint, with significant spurring of the undersurface of the acromion process. Soft tissues are unremarkable. Right chest is clear. IMPRESSION: 1. Prominent osteoarthritis about the right shoulder. No acute displaced fracture. 2. Mild separation of the right acromioclavicular joint, only well visualized on the transscapular view. Electronically Signed   By: Randa Ngo M.D.   On: 07/06/2021 17:07    Procedures Procedures   Medications Ordered in ED Medications  acetaminophen (TYLENOL) tablet 650 mg (has no administration in time range)  HYDROmorphone (DILAUDID) injection 2 mg (2 mg Intramuscular Given 07/07/21 0837)    ED Course  I have reviewed the triage vital signs and the nursing notes.  Pertinent labs & imaging results that were available during my care of the patient were reviewed by me and considered in my medical decision making (see chart for details).    MDM Rules/Calculators/A&P                           Had chest x-ray without any acute findings.  X-ray of the right shoulder without any bony abnormalities may be a little bit of AC separation.  But patient following IM hydromorphone feeling much better.  Moving much better.   Feel that most of her symptoms are related to her chronic pain and fibromyalgia.  Do not feel that there is any acute injuries from the fall on Tuesday.  Patient will follow back up with neurosurgery.  And continue her current meds.  Patient's intermittent numbness to her right lower extremity does not seem to be strokelike at all.  Patient certainly is already being followed by neurosurgery for this.  Also do not feel would be wise to give patient a sling for her right shoulder with all the risks of falls that she has had.  She does have a walker at home that she can use.   Final Clinical Impression(s) / ED Diagnoses Final diagnoses:  Fall, initial encounter  Injury of right shoulder, initial encounter  Chronic pain syndrome  Fibromyalgia    Rx / DC Orders ED Discharge Orders     None        Fredia Sorrow, MD 07/07/21 1041

## 2021-07-25 ENCOUNTER — Other Ambulatory Visit: Payer: Self-pay

## 2021-07-25 DIAGNOSIS — M7989 Other specified soft tissue disorders: Secondary | ICD-10-CM

## 2021-07-27 DIAGNOSIS — I10 Essential (primary) hypertension: Secondary | ICD-10-CM | POA: Diagnosis not present

## 2021-07-27 DIAGNOSIS — I5033 Acute on chronic diastolic (congestive) heart failure: Secondary | ICD-10-CM | POA: Diagnosis not present

## 2021-07-30 NOTE — Progress Notes (Signed)
Office Note     CC:  Leg swelling Requesting Provider:  Kelton Pillar, MD  HPI: Amy [name] is a 75 y.o. (09-May-1946) female presenting at the request of .Kelton Pillar, MD with a longstanding history of bilateral second toe discoloration.  [name], has a complex medical history as outlined below.  She has chronic back pain which was readily apparent in the office as she had significant pain with both sitting and standing. Regarding her feet, [name] complained of pain at the ankle and knees.  She describes intermittent shooting pains to her toes.  She denied symptoms of claudication, ischemic rest pain, tissue loss.  The second toe discoloration is not associated with pain.  She denies history of previous wounds.  The pain in her legs does not improve with elevation.  Her back pain limits her mobility, as well as her activity.  On a typical day, when she has a doctor's visit, she spends the rest of the day at home laying down.  She has no history of previous vascular vein surgery, no history of DVT. Amy [name] does appreciate some telangiectasias as well as varicosities bilaterally.   The pt is  on a statin for cholesterol management.  The pt is not on a daily aspirin.   Other AC:  - The pt is  on medication for hypertension.   The pt is not diabetic.  Tobacco hx:  -  Past Medical History:  Diagnosis Date   Abdominal pain of unknown etiology 02/05/2016   Anxiety    Arthritis    ddd- RA   Asthma    "sleeping asthma"   Chronic back pain    lumbar steroid injection's   Complication of anesthesia    Hard to wake up. Pt sts is took 3 days.   Constipation    Depression    Dysphonia    intermittent "voice changes"   Esophageal dysmotility    Fibromyalgia    Fibromyalgia    GERD (gastroesophageal reflux disease)    Glaucoma    Headache    High cholesterol    ? pt states her doctor told her to continue eating vegetables & fruit   History of DVT (deep vein  thrombosis) 1989   LEFT UPPER ARM   History of hiatal hernia    History of MRSA infection 2011   Hypertension    Irregular heart rate    "years ago"   Low iron    Lumbago    Mild obstructive sleep apnea    per study 02-07-2006 - no cpap   Neuropathy    feet   Pelvic pain in female    Peripheral vascular disease ([place])    "poor circulation"   SBO (small bowel obstruction) ([place]) 06/2016   Seasonal allergies    Short of breath on exertion    Urinary frequency    Vaginal atrophy    Weakness    both hands and feet    Past Surgical History:  Procedure Laterality Date   abdominal adhesions removed     ABDOMINAL HYSTERECTOMY  1978   BACK SURGERY     BUNIONECTOMY Left 2008   CATARACT EXTRACTION Bilateral 2017   COLON SURGERY     COLONOSCOPY     CYSTO WITH HYDRODISTENSION N/A 07/12/2015   Procedure: CYSTOSCOPY/HYDRODISTENSION;  Surgeon: Bjorn Loser, MD;  Location: [facility];  Service: Urology;  Laterality: N/A;   DILATION AND CURETTAGE OF UTERUS     ESOPHAGEAL MANOMETRY N/A 11/22/2014  Procedure: ESOPHAGEAL MANOMETRY (EM);  Surgeon: Winfield Cunas., MD;  Location: [facility];  Service: Endoscopy;  Laterality: N/A;   ESOPHAGOGASTRODUODENOSCOPY N/A 07/15/2013   Procedure: ESOPHAGOGASTRODUODENOSCOPY (EGD);  Surgeon: Winfield Cunas., MD;  Location: [facility];  Service: Endoscopy;  Laterality: N/A;  need xray   EXPLORATORY LAPAROTOMY     EYE SURGERY Bilateral    cataract surgery with lens implants   HARDWARE REMOVAL N/A 01/02/2016   Procedure: Exploration of Lumbar Fusion,Removal of hardware Lumbar One-Two ;Redo Posterior Lumbar Fusion Lumbar One-Two;  Surgeon: Kary Kos, MD;  Location: [facility];  Service: Neurosurgery;  Laterality: N/A;   ingrown toenail removal     LAPAROSCOPIC CHOLECYSTECTOMY  01-11-2006   LAPAROSCOPY N/A 07/06/2016   Procedure: LAPAROSCOPY DIAGNOSTIC EMERGENT TO OPEN;  Surgeon: Greer Pickerel, MD;  Location: [facility];  Service:  General;  Laterality: N/A;   LAPAROTOMY N/A 07/06/2016   Procedure: EXPLORATORY LAPAROTOMY LYSIS OF ADHESIONS FOR 3 HOURS;  Surgeon: Greer Pickerel, MD;  Location: [place];  Service: General;  Laterality: N/A;   lipoma removal     LUMBAR FUSION  2014   L4 -- L5   MASS EXCISION Left 02/20/2013   Procedure: EXCISION LEFT BUTTOCK  MASS;  Surgeon: Adin Hector, MD;  Location: [facility];  Service: General;  Laterality: Left;   NOSE SURGERY  2007   REMOVAL HARDWARE L4-L5/  BILATERAL LAMINECTOMY L2 - L5 AND FUSION  06-12-2011   SHOULDER OPEN ROTATOR CUFF REPAIR Left 05/05/2014   Procedure: OPEN ACROMIONECTOMY AND OPEN REPAIR OF ROTATOR CUFF, TISSUEMEND GRAFT WITH ANCHOR ;  Surgeon: Tobi Bastos, MD;  Location: [facility];  Service: Orthopedics;  Laterality: Left;   SPINE SURGERY     x6   TONSILLECTOMY      Social History   Socioeconomic History   Marital status: Married    Spouse name: Not on file   Number of children: 1   Years of education: Not on file   Highest education level: Master's degree (e.g., MA, MS, MEng, MEd, MSW, MBA)  Occupational History   Not on file  Tobacco Use   Smoking status: Former    Types: Cigarettes    Quit date: 07/10/1969    Years since quitting: 52.0   Smokeless tobacco: Never  Vaping Use   Vaping Use: Never used  Substance and Sexual Activity   Alcohol use: Not Currently    Comment: rare, social   Drug use: No   Sexual activity: Not on file  Other Topics Concern   Not on file  Social History Narrative   Lives at home with husband    Right handed   Caffeine: max 2 cups coffee per day but not does not drink daily.   Social Determinants of Health   Financial Resource Strain: Not on file  Food Insecurity: Not on file  Transportation Needs: Not on file  Physical Activity: Not on file  Stress: Not on file  Social Connections: Not on file  Intimate Partner Violence: Not on file    Family History  Problem Relation Age of Onset   Hypertension Mother     Cancer Mother        cervix   Kidney disease Mother    Osteoarthritis Mother    Diabetes Mother    Hypertension Sister    Cancer Sister        polyps   Kidney disease Brother    Cancer Daughter  leukemia   Hypertension Brother    Liver cancer Brother    Hypertension Father    Colon cancer Father    Colon polyps Father    Hypertension Brother    Liver cancer Brother    Aneurysm Maternal Grandmother    Migraines Neg Hx    Headache Neg Hx     Current Outpatient Medications  Medication Sig Dispense Refill   acetaminophen (TYLENOL) 500 MG tablet Take 1,000 mg by mouth every 6 (six) hours as needed for moderate pain.      atorvastatin (LIPITOR) 20 MG tablet Take 1 tablet (20 mg total) by mouth daily. 30 tablet 3   B Complex Vitamins (B COMPLEX PO) Take 1 tablet by mouth daily.     cloNIDine (CATAPRES) 0.1 MG tablet Take 1 tablet (0.1 mg total) by mouth 3 (three) times daily. 60 tablet 11   diclofenac sodium (VOLTAREN) 1 % GEL Apply 4 g topically 4 (four) times daily. (Patient taking differently: Apply 4 g topically 4 (four) times daily as needed (dry skin, rash).) 100 g 0   diphenhydrAMINE (BENADRYL) 25 MG tablet Take 25 mg by mouth 2 (two) times daily as needed for allergies.     estradiol (ESTRACE) 0.1 MG/GM vaginal cream Place 1 Applicatorful vaginally 2 (two) times a week. (Patient taking differently: Place 1 Applicatorful vaginally See admin instructions. Tuesday and thursday) 42.5 g 12   fluticasone (FLONASE) 50 MCG/ACT nasal spray Place 1 spray into both nostrils daily.  2   furosemide (LASIX) 20 MG tablet Take 1 tablet (20 mg total) by mouth daily as needed for edema. 30 tablet 3   gabapentin (NEURONTIN) 600 MG tablet Take 600-1,200 mg by mouth 2 (two) times daily. Take 600mg  in the AM and 1,200mg  in the PM     hydrALAZINE (APRESOLINE) 50 MG tablet Take 50 mg by mouth 3 (three) times daily.     hydrochlorothiazide (HYDRODIURIL) 12.5 MG tablet Take 12.5 mg by mouth  daily.     LORazepam (ATIVAN) 1 MG tablet Take 1 tablet (1 mg total) by mouth as directed. Take 1 tablet 30-60 minutes prior to CT/MRI scan (Patient taking differently: Take 1 mg by mouth once as needed (30-60 minutes prior to CT/MRI scan).) 2 tablet 0   meloxicam (MOBIC) 7.5 MG tablet Take 1 tablet (7.5 mg total) by mouth daily. (Patient not taking: No sig reported) 15 tablet 0   Menthol, Topical Analgesic, (ICY HOT BACK EX) Apply 1 application topically 2 (two) times daily as needed (pain).     methocarbamol (ROBAXIN) 500 MG tablet Take 500 mg by mouth 4 (four) times daily as needed for muscle spasms.     metoprolol (LOPRESSOR) 50 MG tablet Take 1 tablet (50 mg total) by mouth 2 (two) times daily.     pantoprazole (PROTONIX) 40 MG tablet Take 80 mg by mouth daily.   0   PARoxetine (PAXIL) 30 MG tablet Take 30 mg by mouth daily.     potassium chloride SA (K-DUR,KLOR-CON) 20 MEQ tablet Take 20 mEq by mouth daily.     traMADol (ULTRAM) 50 MG tablet Take 50 mg by mouth every 6 (six) hours as needed for moderate pain or severe pain.     triamcinolone ointment (KENALOG) 0.5 % Apply 1 application topically 2 (two) times daily. To rash to feet (Patient not taking: No sig reported) 30 g 0   vitamin E 180 MG (400 UNITS) capsule Take 400 Units by mouth daily.  No current facility-administered medications for this visit.    Allergies  Allergen Reactions   Latex Other (See Comments)    Sores, blisters - reaction to gloves   Crestor [Rosuvastatin Calcium]     Myalgias   Penicillins Other (See Comments)    UNSPECIFIED REACTION  "Has taken amoxicillin and ampicillin with no reaction" Has patient had a PCN reaction causing immediate rash, facial/tongue/throat swelling, SOB or lightheadedness with hypotension: Unknown Has patient had a PCN reaction causing severe rash involving mucus membranes or skin necrosis: Unknown Has patient had a PCN reaction that required hospitalization: Unknown Has patient  had a PCN reaction occurring within the last 10 years: No    Tetanus Toxoids Rash   Tomato Rash     REVIEW OF SYSTEMS:   [X]  denotes positive finding, [ ]  denotes negative finding Cardiac  Comments:  Chest pain or chest pressure:    Shortness of breath upon exertion:    Short of breath when lying flat:    Irregular heart rhythm:        Vascular    Pain in calf, thigh, or hip brought on by ambulation:    Pain in feet at night that wakes you up from your sleep:     Blood clot in your veins:    Leg swelling:         Pulmonary    Oxygen at home:    Productive cough:     Wheezing:         Neurologic    Sudden weakness in arms or legs:     Sudden numbness in arms or legs:     Sudden onset of difficulty speaking or slurred speech:    Temporary loss of vision in one eye:     Problems with dizziness:         Gastrointestinal    Blood in stool:     Vomited blood:         Genitourinary    Burning when urinating:     Blood in urine:        Psychiatric    Major depression:         Hematologic    Bleeding problems:    Problems with blood clotting too easily:        Skin    Rashes or ulcers:        Constitutional    Fever or chills:      PHYSICAL EXAMINATION:  There were no vitals filed for this visit.  General:  WDWN in NAD; vital signs documented above, obese Gait: Not observed HENT: WNL, normocephalic Pulmonary: normal non-labored breathing , without Rales, rhonchi,  wheezing Cardiac: regular HR,  Abdomen: soft, NT, no masses Skin: without rashes Vascular Exam/Pulses:  Right Left  Radial 2+ (normal) 2+ (normal)  Ulnar 2+ (normal) 2+ (normal)  Femoral    Popliteal    DP 2+ (normal) 2+ (normal)  PT 2+ (normal) 2+ (normal)   Extremities: without ischemic changes, without Gangrene , without cellulitis; without open wounds;  Musculoskeletal: no muscle wasting or atrophy  Neurologic: A&O X 3;  No focal weakness or paresthesias are detected Psychiatric:   The pt has Normal affect.   Non-Invasive Vascular Imaging:   Summary:  Bilateral:  - No evidence of deep vein thrombosis seen in the lower extremities,  bilaterally, from the common femoral through the popliteal veins.  - No evidence of superficial venous thrombosis in the lower extremities,  bilaterally.  Right:  - No evidence of superficial venous reflux seen in the right short  saphenous vein.  - Venous reflux is noted in the right sapheno-femoral junction.  - Venous reflux is noted in the right greater saphenous vein in the thigh.     Left:  - No evidence of superficial venous reflux seen in the left greater  saphenous vein.  - No evidence of superficial venous reflux seen in the left short  saphenous vein.  - Venous reflux is noted in the left common femoral vein.       ASSESSMENT/PLAN: KUMARI SCULLEY is a 75 y.o. female presenting with bilateral second toe discoloration.  This discoloration appears mild.  I do not have a vascular or venous etiology for the discoloration.  Pain in the foot/ankle/knees is likely associated to back pain and the patient's weight.  She has a palpable pulse in the foot bilaterally, and therefore I am not concerned for significant peripheral arterial disease.  I independently reviewed the patient's lower extremity venous duplex ultrasonography demonstrating mild superficial venous reflux in the right leg, focal, deep venous reflux at the left common femoral vein.  Treatment for venous insufficiency this is bilateral compression stockings, thigh-high.  Unfortunately, due to the patient's weight, and poor mobility due to back pain, she have a very difficult time applying the stockings, says she will try.  The pain described by [employer] most consistent with a musculoskeletal etiology. Kathleene can follow-up with me as needed should questions or concerns arise. She was asked to call if an ulceration or nonhealing calf wounds  occurred.   Broadus John, MD [facility] [phone]

## 2021-07-31 ENCOUNTER — Other Ambulatory Visit: Payer: Self-pay

## 2021-07-31 ENCOUNTER — Ambulatory Visit (INDEPENDENT_AMBULATORY_CARE_PROVIDER_SITE_OTHER): Payer: Medicare Other | Admitting: Vascular Surgery

## 2021-07-31 ENCOUNTER — Ambulatory Visit (HOSPITAL_COMMUNITY)
Admission: RE | Admit: 2021-07-31 | Discharge: 2021-07-31 | Disposition: A | Discharge status: Home / Self Care | Payer: Medicare Other | Source: Ambulatory Visit | Attending: Vascular Surgery | Admitting: Vascular Surgery

## 2021-07-31 ENCOUNTER — Encounter: Payer: Self-pay | Admitting: Vascular Surgery

## 2021-07-31 VITALS — BP 172/83 | HR 71 | Temp 97.2°F | Resp 16 | Ht 65.5 in | Wt 290.0 lb

## 2021-07-31 DIAGNOSIS — I872 Venous insufficiency (chronic) (peripheral): Secondary | ICD-10-CM

## 2021-07-31 DIAGNOSIS — M7989 Other specified soft tissue disorders: Secondary | ICD-10-CM | POA: Diagnosis not present

## 2021-08-01 ENCOUNTER — Other Ambulatory Visit: Payer: Self-pay | Admitting: Family Medicine

## 2021-08-01 DIAGNOSIS — I509 Heart failure, unspecified: Secondary | ICD-10-CM

## 2021-08-02 ENCOUNTER — Other Ambulatory Visit: Payer: Self-pay

## 2021-08-02 ENCOUNTER — Ambulatory Visit (INDEPENDENT_AMBULATORY_CARE_PROVIDER_SITE_OTHER): Payer: Medicare Other

## 2021-08-02 DIAGNOSIS — I509 Heart failure, unspecified: Secondary | ICD-10-CM | POA: Diagnosis not present

## 2021-08-02 DIAGNOSIS — R0602 Shortness of breath: Secondary | ICD-10-CM

## 2021-08-02 LAB — ECHOCARDIOGRAM COMPLETE
AR max vel: 2.44 cm2
AV Area VTI: 2.65 cm2
AV Area mean vel: 2.57 cm2
AV Mean grad: 6 mmHg
AV Peak grad: 11.8 mmHg
Ao pk vel: 1.72 m/s
Area-P 1/2: 3.16 cm2

## 2021-08-02 MED ORDER — PERFLUTREN LIPID MICROSPHERE
1.0000 mL | INTRAVENOUS | Status: AC | PRN
Start: 2021-08-02 — End: 2021-08-02
  Administered 2021-08-02: 2 mL via INTRAVENOUS

## 2021-08-03 ENCOUNTER — Encounter: Payer: Self-pay | Admitting: Cardiology

## 2021-08-03 ENCOUNTER — Ambulatory Visit (INDEPENDENT_AMBULATORY_CARE_PROVIDER_SITE_OTHER): Payer: Medicare Other | Admitting: Cardiology

## 2021-08-03 VITALS — BP 110/72 | HR 71 | Ht 65.0 in | Wt 290.0 lb

## 2021-08-03 DIAGNOSIS — R079 Chest pain, unspecified: Secondary | ICD-10-CM | POA: Diagnosis not present

## 2021-08-03 DIAGNOSIS — I5189 Other ill-defined heart diseases: Secondary | ICD-10-CM

## 2021-08-03 DIAGNOSIS — I10 Essential (primary) hypertension: Secondary | ICD-10-CM | POA: Diagnosis not present

## 2021-08-03 NOTE — Progress Notes (Signed)
Cardiology Office Note:    Date:  08/03/2021   ID:  Amy [name], DOB 09-Nov-1945, MRN [id number]  PCP:  Kelton Pillar, MD   [facility] [place] [facility]  Cardiologist:  Kate Sable, MD  Advanced Practice Provider:  No care team member to display Electrophysiologist:  None       Referring MD: Kelton Pillar, MD   Chief Complaint  Patient presents with   OTher    Patient c.o chest pain, Difficulty breathing. Patient states its hard for her to sleep and it wakes her up. Meds reviewed verbally with patient.     History of Present Illness:    Amy [name] is a 75 y.o. female with a hx of anxiety, hypertension, hyperlipidemia, OSA on CPAP, morbid obesity, chronic back pain, who presents due to shortness of breath and chest pain.    States having ongoing chest discomfort, also complains of back pain.  Had an echocardiogram performed yesterday showing normal systolic function, grade 2 diastolic dysfunction.  Saw orthopedic surgery due to chronic back pains.  She has had about 5 back surgeries before, was told she cannot have another surgery.   Prior notes Lexiscan Myoview 01/2021 no evidence for ischemia, low risk study. Echo 9/52/8413 normal systolic function, EF 60 to 65%, impaired relaxation. Echo 06/2016 showed normal systolic function, EF 60 to 65%.  Impaired relaxation.  Past Medical History:  Diagnosis Date   Abdominal pain of unknown etiology 02/05/2016   Anxiety    Arthritis    ddd- RA   Asthma    "sleeping asthma"   Chronic back pain    lumbar steroid injection's   Complication of anesthesia    Hard to wake up. Pt sts is took 3 days.   Constipation    Depression    Dysphonia    intermittent "voice changes"   Esophageal dysmotility    Fibromyalgia    Fibromyalgia    GERD (gastroesophageal reflux disease)    Glaucoma    Headache    High cholesterol    ? pt states her doctor told her to continue eating vegetables & fruit    History of DVT (deep vein thrombosis) 1989   LEFT UPPER ARM   History of hiatal hernia    History of MRSA infection 2011   Hypertension    Irregular heart rate    "years ago"   Low iron    Lumbago    Mild obstructive sleep apnea    per study 02-07-2006 - no cpap   Neuropathy    feet   Pelvic pain in female    Peripheral vascular disease ([place])    "poor circulation"   SBO (small bowel obstruction) ([place]) 06/2016   Seasonal allergies    Short of breath on exertion    Urinary frequency    Vaginal atrophy    Weakness    both hands and feet    Past Surgical History:  Procedure Laterality Date   abdominal adhesions removed     ABDOMINAL HYSTERECTOMY  1978   BACK SURGERY     BUNIONECTOMY Left 2008   CATARACT EXTRACTION Bilateral 2017   COLON SURGERY     COLONOSCOPY     CYSTO WITH HYDRODISTENSION N/A 07/12/2015   Procedure: CYSTOSCOPY/HYDRODISTENSION;  Surgeon: Bjorn Loser, MD;  Location: [facility];  Service: Urology;  Laterality: N/A;   DILATION AND CURETTAGE OF UTERUS     ESOPHAGEAL MANOMETRY N/A 11/22/2014   Procedure: ESOPHAGEAL MANOMETRY (EM);  Surgeon:  Winfield Cunas., MD;  Location: [facility];  Service: Endoscopy;  Laterality: N/A;   ESOPHAGOGASTRODUODENOSCOPY N/A 07/15/2013   Procedure: ESOPHAGOGASTRODUODENOSCOPY (EGD);  Surgeon: Winfield Cunas., MD;  Location: [facility];  Service: Endoscopy;  Laterality: N/A;  need xray   EXPLORATORY LAPAROTOMY     EYE SURGERY Bilateral    cataract surgery with lens implants   HARDWARE REMOVAL N/A 01/02/2016   Procedure: Exploration of Lumbar Fusion,Removal of hardware Lumbar One-Two ;Redo Posterior Lumbar Fusion Lumbar One-Two;  Surgeon: Kary Kos, MD;  Location: [facility];  Service: Neurosurgery;  Laterality: N/A;   ingrown toenail removal     LAPAROSCOPIC CHOLECYSTECTOMY  01-11-2006   LAPAROSCOPY N/A 07/06/2016   Procedure: LAPAROSCOPY DIAGNOSTIC EMERGENT TO OPEN;  Surgeon: Greer Pickerel, MD;   Location: [facility];  Service: General;  Laterality: N/A;   LAPAROTOMY N/A 07/06/2016   Procedure: EXPLORATORY LAPAROTOMY LYSIS OF ADHESIONS FOR 3 HOURS;  Surgeon: Greer Pickerel, MD;  Location: [facility];  Service: General;  Laterality: N/A;   lipoma removal     LUMBAR FUSION  2014   L4 -- L5   MASS EXCISION Left 02/20/2013   Procedure: EXCISION LEFT BUTTOCK  MASS;  Surgeon: Adin Hector, MD;  Location: [facility];  Service: General;  Laterality: Left;   NOSE SURGERY  2007   REMOVAL HARDWARE L4-L5/  BILATERAL LAMINECTOMY L2 - L5 AND FUSION  06-12-2011   SHOULDER OPEN ROTATOR CUFF REPAIR Left 05/05/2014   Procedure: OPEN ACROMIONECTOMY AND OPEN REPAIR OF ROTATOR CUFF, TISSUEMEND GRAFT WITH ANCHOR ;  Surgeon: Tobi Bastos, MD;  Location: [facility];  Service: Orthopedics;  Laterality: Left;   SPINE SURGERY     x6   TONSILLECTOMY      Current Medications: Current Meds  Medication Sig   acetaminophen (TYLENOL) 500 MG tablet Take 1,000 mg by mouth every 6 (six) hours as needed for moderate pain.    atorvastatin (LIPITOR) 20 MG tablet Take 1 tablet (20 mg total) by mouth daily.   B Complex Vitamins (B COMPLEX PO) Take 1 tablet by mouth daily.   cloNIDine (CATAPRES) 0.1 MG tablet Take 1 tablet (0.1 mg total) by mouth 3 (three) times daily.   diclofenac sodium (VOLTAREN) 1 % GEL Apply 4 g topically 4 (four) times daily.   diphenhydrAMINE (BENADRYL) 25 MG tablet Take 25 mg by mouth 2 (two) times daily as needed for allergies.   estradiol (ESTRACE) 0.1 MG/GM vaginal cream Place 1 Applicatorful vaginally 2 (two) times a week.   fluticasone (FLONASE) 50 MCG/ACT nasal spray Place 1 spray into both nostrils daily.   furosemide (LASIX) 20 MG tablet Take 1 tablet (20 mg total) by mouth daily as needed for edema.   gabapentin (NEURONTIN) 600 MG tablet Take 600-1,200 mg by mouth 2 (two) times daily. Take 600mg  in the AM and 1,200mg  in the PM   hydrALAZINE (APRESOLINE) 50 MG tablet Take 50 mg by mouth 3 (three) times  daily.   hydrochlorothiazide (HYDRODIURIL) 12.5 MG tablet Take 12.5 mg by mouth daily.   LORazepam (ATIVAN) 1 MG tablet Take 1 tablet (1 mg total) by mouth as directed. Take 1 tablet 30-60 minutes prior to CT/MRI scan   Menthol, Topical Analgesic, (ICY HOT BACK EX) Apply 1 application topically 2 (two) times daily as needed (pain).   methocarbamol (ROBAXIN) 500 MG tablet Take 500 mg by mouth 4 (four) times daily as needed for muscle spasms.   metoprolol (LOPRESSOR) 50 MG tablet Take 1  tablet (50 mg total) by mouth 2 (two) times daily.   pantoprazole (PROTONIX) 40 MG tablet Take 80 mg by mouth daily.    PARoxetine (PAXIL) 30 MG tablet Take 30 mg by mouth daily.   potassium chloride SA (K-DUR,KLOR-CON) 20 MEQ tablet Take 20 mEq by mouth daily.   traMADol (ULTRAM) 50 MG tablet Take 50 mg by mouth every 6 (six) hours as needed for moderate pain or severe pain.   triamcinolone ointment (KENALOG) 0.5 % Apply 1 application topically 2 (two) times daily. To rash to feet   vitamin E 180 MG (400 UNITS) capsule Take 400 Units by mouth daily.     Allergies:   Latex, Crestor [rosuvastatin calcium], Penicillins, Tetanus toxoids, and Tomato   Social History   Socioeconomic History   Marital status: Married    Spouse name: Not on file   Number of children: 1   Years of education: Not on file   Highest education level: Master's degree (e.g., MA, MS, MEng, MEd, MSW, MBA)  Occupational History   Not on file  Tobacco Use   Smoking status: Former    Types: Cigarettes    Quit date: 07/10/1969    Years since quitting: 52.1   Smokeless tobacco: Never  Vaping Use   Vaping Use: Never used  Substance and Sexual Activity   Alcohol use: Not Currently    Comment: rare, social   Drug use: No   Sexual activity: Not on file  Other Topics Concern   Not on file  Social History Narrative   Lives at home with husband    Right handed   Caffeine: max 2 cups coffee per day but not does not drink daily.    Social Determinants of Health   Financial Resource Strain: Not on file  Food Insecurity: Not on file  Transportation Needs: Not on file  Physical Activity: Not on file  Stress: Not on file  Social Connections: Not on file     Family History: The patient's family history includes Aneurysm in her maternal grandmother; Cancer in her daughter, mother, and sister; Colon cancer in her father; Colon polyps in her father; Diabetes in her mother; Hypertension in her brother, brother, father, mother, and sister; Kidney disease in her brother and mother; Liver cancer in her brother and brother; Osteoarthritis in her mother. There is no history of Migraines or Headache.  ROS:   Please see the history of present illness.     All other systems reviewed and are negative.  EKGs/Labs/Other Studies Reviewed:    The following studies were reviewed today:   EKG:  EKG is  ordered today.  The ekg ordered today demonstrates normal sinus rhythm, normal ECG  Recent Labs: 04/27/2021: ALT 16 07/06/2021: BUN 12; Creatinine, Ser 1.11; Hemoglobin 13.9; Platelets 341; Potassium 4.8; Sodium 138  Recent Lipid Panel    Component Value Date/Time   CHOL 165 07/03/2016 0627   TRIG 148 07/27/2016 0415   HDL 82 07/03/2016 0627   CHOLHDL 2.0 07/03/2016 0627   VLDL 8 07/03/2016 0627   LDLCALC 75 07/03/2016 0627     Risk Assessment/Calculations:      Physical Exam:    VS:  BP 110/72 (BP Location: Right Arm, Patient Position: Sitting, Cuff Size: Large)   Pulse 71   Ht 5\' 5"  (1.651 m)   Wt 290 lb (131.5 kg)   SpO2 99%   BMI 48.26 kg/m     Wt Readings from Last 3 Encounters:  08/03/21 290 lb (  131.5 kg)  07/31/21 290 lb (131.5 kg)  04/27/21 295 lb (133.8 kg)     GEN:  Well nourished, well developed in no acute distress HEENT: Normal NECK: No JVD; No carotid bruits LYMPHATICS: No lymphadenopathy CARDIAC: RRR, no murmurs, rubs, gallops RESPIRATORY:  Clear to auscultation without rales, wheezing or  rhonchi  ABDOMEN: Soft, non-tender, non-distended MUSCULOSKELETAL:  trace edema; No deformity  SKIN: Warm and dry NEUROLOGIC:  Alert and oriented x 3 PSYCHIATRIC:  Normal affect   ASSESSMENT:    1. Chest pain, unspecified type   2. Grade II diastolic dysfunction   3. Primary hypertension   4. Morbid obesity ([place])     PLAN:    In order of problems listed above:  Chest pain, atypical symptoms, echo with preserved EF, Myoview with no evidence for ischemia.  Continue to monitor symptoms, treatment of chronic pain as per primary care provider. Diastolic dysfunction, likely from morbid obesity.  Hypertension also contributing.  EF preserved, trivial to no edema.  Continue Lasix 20 mg daily.  Weight loss advised. Hypertension, BP controlled.  Continue current HCTZ, Lopressor. Morbid obesity, low-calorie diet, weight loss advised.  Follow-up in 6 months.   Medication Adjustments/Labs and Tests Ordered: Current medicines are reviewed at length with the patient today.  Concerns regarding medicines are outlined above.  Orders Placed This Encounter  Procedures   EKG 12-Lead    No orders of the defined types were placed in this encounter.   Patient Instructions  Medication Instructions:  Your physician recommends that you continue on your current medications as directed. Please refer to the Current Medication list given to you today.  *If you need a refill on your cardiac medications before your next appointment, please call your pharmacy*   Lab Work: None ordered If you have labs (blood work) drawn today and your tests are completely normal, you will receive your results only by: [place] (if you have MyChart) OR A paper copy in the mail If you have any lab test that is abnormal or we need to change your treatment, we will call you to review the results.   Testing/Procedures: None ordered   Follow-Up: At [facility], you and your health needs are our priority.   As part of our continuing mission to provide you with exceptional heart care, we have created designated Provider Care Teams.  These Care Teams include your primary Cardiologist (physician) and Advanced Practice Providers (APPs -  Physician Assistants and Nurse Practitioners) who all work together to provide you with the care you need, when you need it.  We recommend signing up for the patient portal called "MyChart".  Sign up information is provided on this After Visit Summary.  MyChart is used to connect with patients for Virtual Visits (Telemedicine).  Patients are able to view lab/test results, encounter notes, upcoming appointments, etc.  Non-urgent messages can be sent to your provider as well.   To learn more about what you can do with MyChart, go to [email or web address].    Your next appointment:   6 month(s)  The format for your next appointment:   In Person  Provider:   You may see Kate Sable, MD or one of the following Advanced Practice Providers on your designated Care Team:   Murray Hodgkins, NP Christell Faith, PA-C Cadence Kathlen Mody, [place]    Other Instructions    Signed, Kate Sable, MD  08/03/2021 4:41 PM    [place]

## 2021-08-03 NOTE — Patient Instructions (Signed)
Medication Instructions:  Your physician recommends that you continue on your current medications as directed. Please refer to the Current Medication list given to you today.  *If you need a refill on your cardiac medications before your next appointment, please call your pharmacy*   Lab Work: None ordered If you have labs (blood work) drawn today and your tests are completely normal, you will receive your results only by: [place] (if you have MyChart) OR A paper copy in the mail If you have any lab test that is abnormal or we need to change your treatment, we will call you to review the results.   Testing/Procedures: None ordered   Follow-Up: At [facility], you and your health needs are our priority.  As part of our continuing mission to provide you with exceptional heart care, we have created designated Provider Care Teams.  These Care Teams include your primary Cardiologist (physician) and Advanced Practice Providers (APPs -  Physician Assistants and Nurse Practitioners) who all work together to provide you with the care you need, when you need it.  We recommend signing up for the patient portal called "MyChart".  Sign up information is provided on this After Visit Summary.  MyChart is used to connect with patients for Virtual Visits (Telemedicine).  Patients are able to view lab/test results, encounter notes, upcoming appointments, etc.  Non-urgent messages can be sent to your provider as well.   To learn more about what you can do with MyChart, go to [email or web address].    Your next appointment:   6 month(s)  The format for your next appointment:   In Person  Provider:   You may see Kate Sable, MD or one of the following Advanced Practice Providers on your designated Care Team:   Murray Hodgkins, NP Christell Faith, PA-C Cadence Kathlen Mody, [place]    Other Instructions

## 2021-08-10 DIAGNOSIS — I10 Essential (primary) hypertension: Secondary | ICD-10-CM | POA: Diagnosis not present

## 2021-08-10 DIAGNOSIS — I5189 Other ill-defined heart diseases: Secondary | ICD-10-CM | POA: Diagnosis not present

## 2021-08-10 DIAGNOSIS — M545 Low back pain, unspecified: Secondary | ICD-10-CM | POA: Diagnosis not present

## 2021-08-10 DIAGNOSIS — G8929 Other chronic pain: Secondary | ICD-10-CM | POA: Diagnosis not present

## 2021-08-10 DIAGNOSIS — Z23 Encounter for immunization: Secondary | ICD-10-CM | POA: Diagnosis not present

## 2021-08-16 DIAGNOSIS — K259 Gastric ulcer, unspecified as acute or chronic, without hemorrhage or perforation: Secondary | ICD-10-CM | POA: Diagnosis not present

## 2021-08-16 DIAGNOSIS — Z86012 Personal history of benign carcinoid tumor: Secondary | ICD-10-CM | POA: Diagnosis not present

## 2021-08-16 DIAGNOSIS — K2930 Chronic superficial gastritis without bleeding: Secondary | ICD-10-CM | POA: Diagnosis not present

## 2021-08-21 IMAGING — CT CT RENAL STONE PROTOCOL
2 of 4 series · 16 of 46 positions shown, 18 images · non-contrast
Comparison: 12/21/2020, CT chest 07/07/2016

CLINICAL DATA: Fall, right hip pain, right lower quadrant abdominal
pain

EXAM:
CT ABDOMEN AND PELVIS WITHOUT CONTRAST
TECHNIQUE: Multidetector CT imaging of the abdomen and pelvis was performed
following the standard protocol without IV contrast.

[Series 3: ap without · axial · non-contrast · 0.92mm/px · z∈[-470,-64]mm · 13 of 93 slices shown, 15 images]
[im 6/93  soft-tissue]
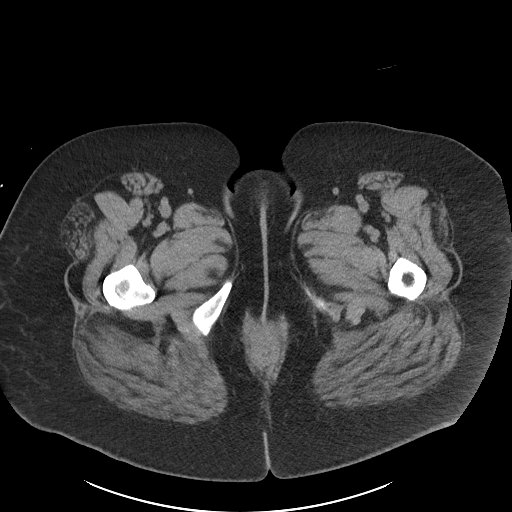
[im 6/93  bone]
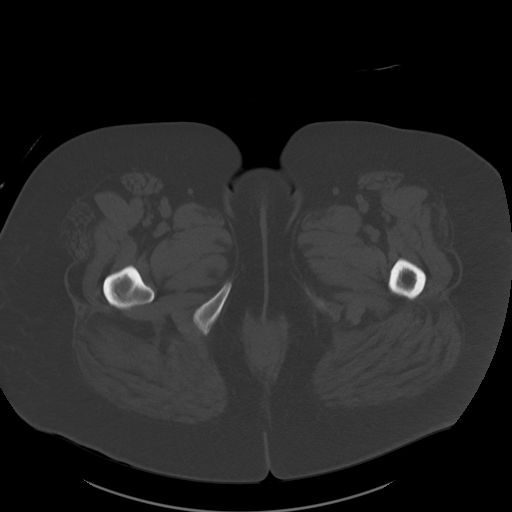
[im 11/93  soft-tissue]
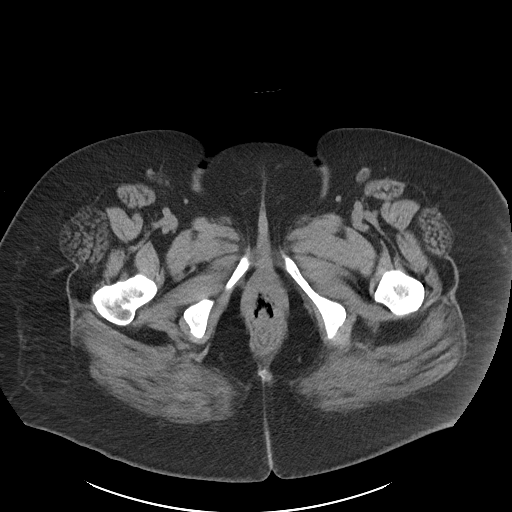
[im 21/93  soft-tissue]
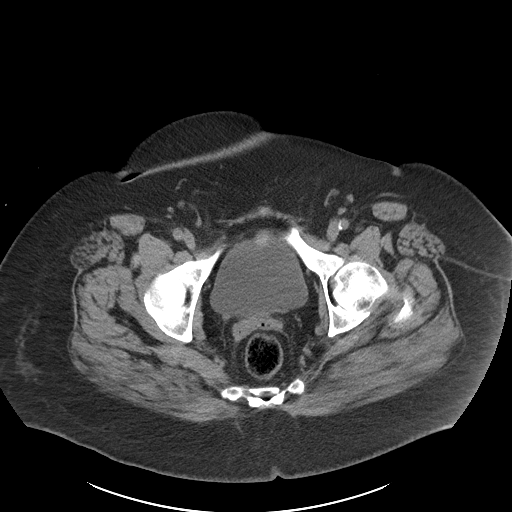
[im 26/93  soft-tissue]
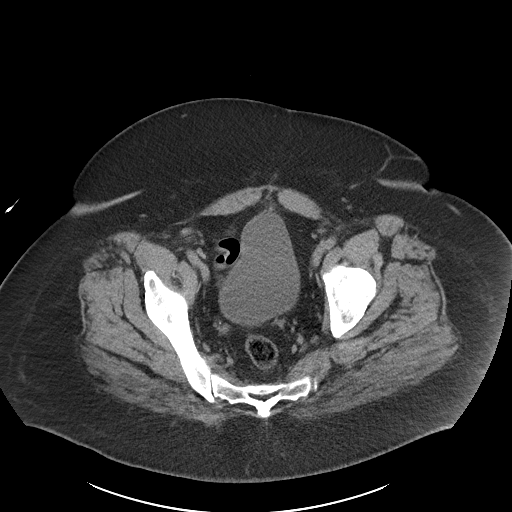
[im 31/93  soft-tissue]
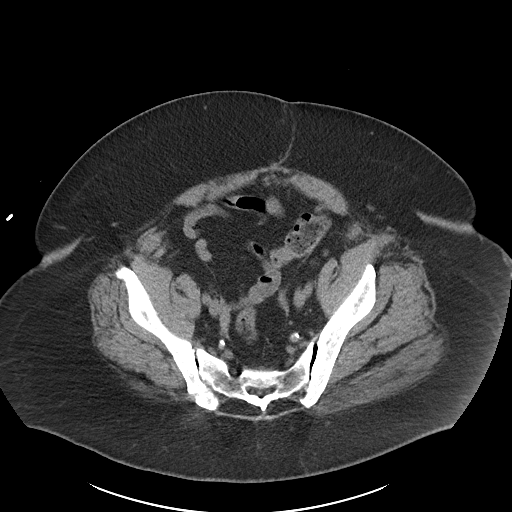
[im 41/93  soft-tissue]
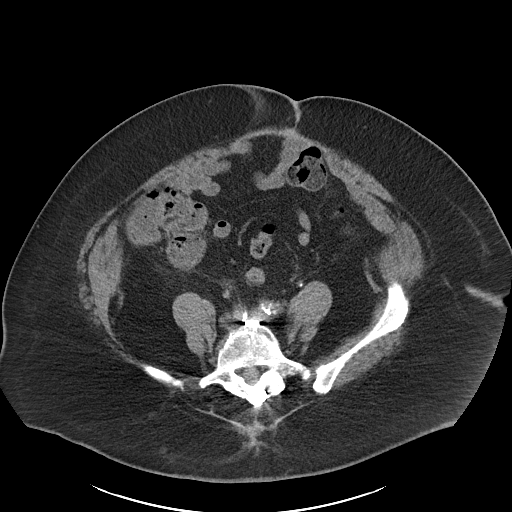
[im 47/93  soft-tissue]
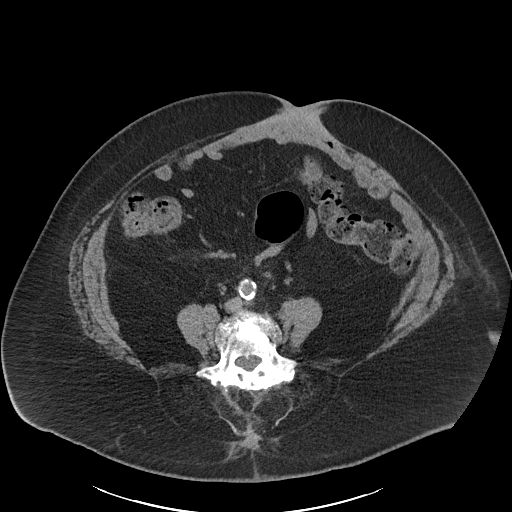
[im 52/93  soft-tissue]
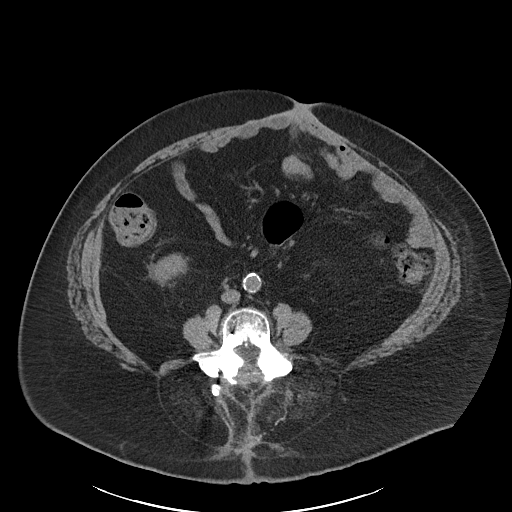
[im 62/93  soft-tissue]
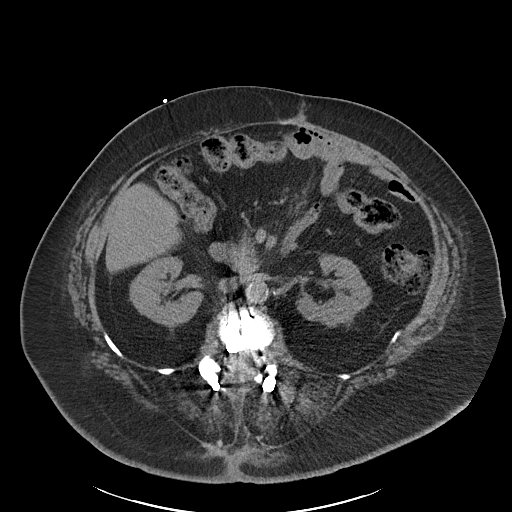
[im 62/93  bone]
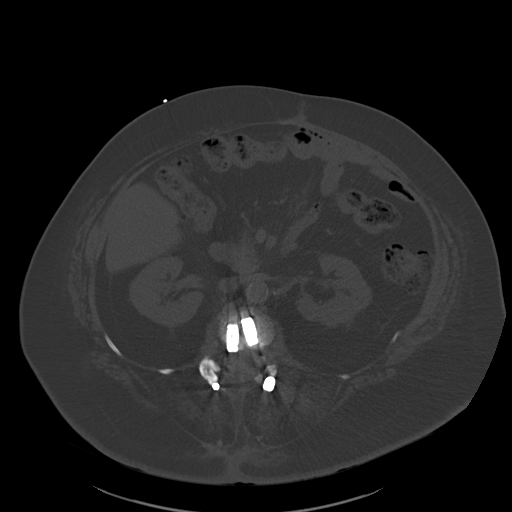
[im 67/93  soft-tissue]
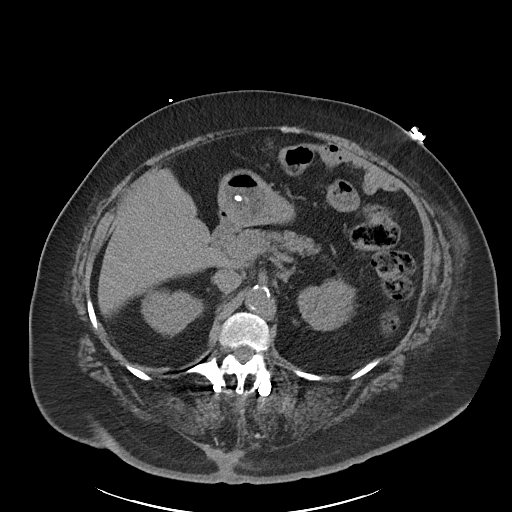
[im 72/93  soft-tissue]
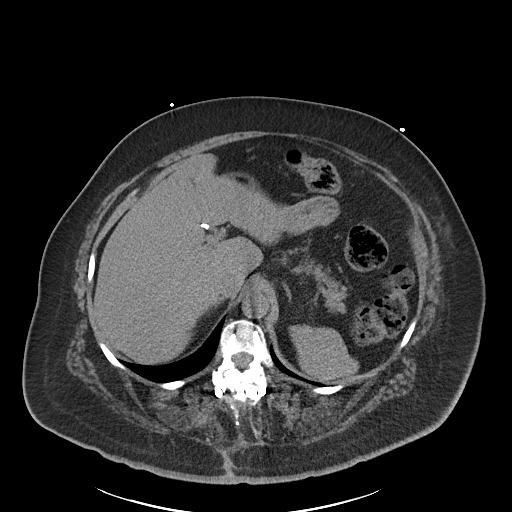
[im 82/93  soft-tissue]
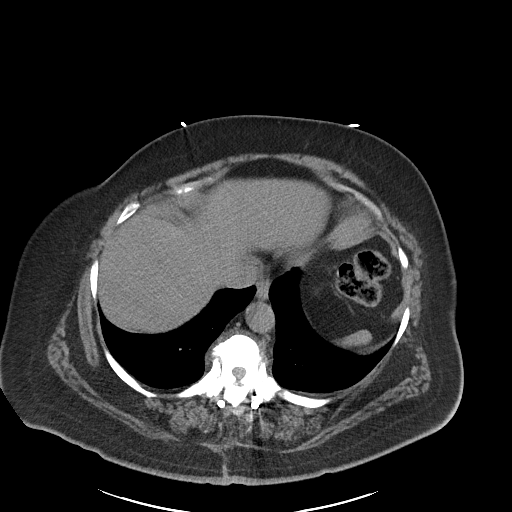
[im 87/93  soft-tissue]
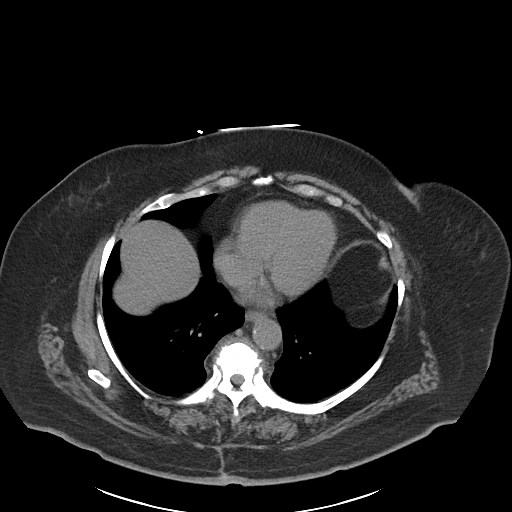

[Series 5: cor · coronal · 0.97mm/px · 3 of 124 slices shown]
[im 42/124  soft-tissue]
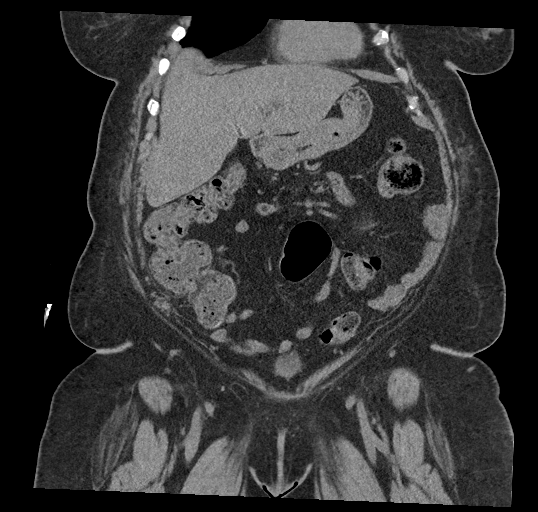
[im 55/124  soft-tissue]
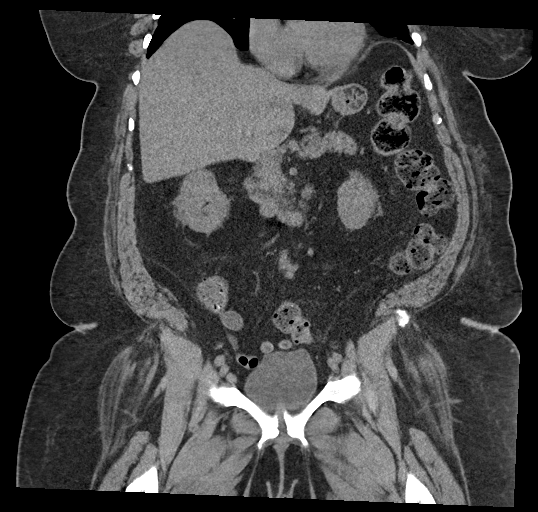
[im 69/124  soft-tissue]
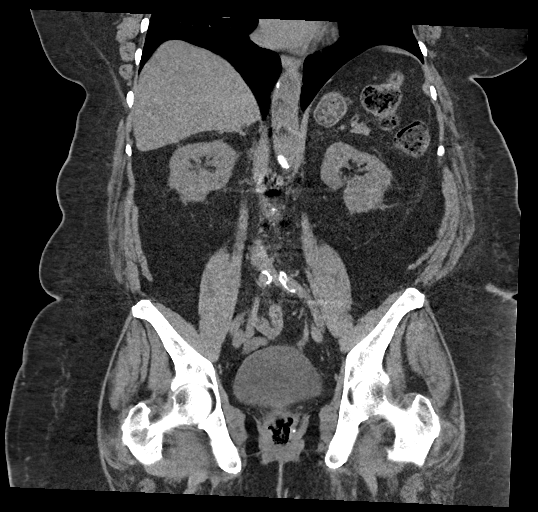

[16 of 46 positions shown; findings below may reference images not displayed]

FINDINGS: Lower chest: 9 mm ground-glass pulmonary nodule is seen within the
peripheral right lower lobe, axial image # [DATE], stable since remote
prior CT examination of the chest of 07/07/2016. Cardiac size within
normal limits.

Hepatobiliary: No focal liver abnormality is seen. Status post
cholecystectomy. No biliary dilatation.

Pancreas: Unremarkable

Spleen: Unremarkable

Adrenals/Urinary Tract: Adrenal glands are unremarkable. Kidneys are
normal, without renal calculi, focal lesion, or hydronephrosis.
Bladder is unremarkable.

Stomach/Bowel: The stomach, small bowel, and large bowel are
unremarkable. Appendix absent. No free intraperitoneal gas or fluid.

Vascular/Lymphatic: Moderate aortoiliac atherosclerotic
calcification. No aortic aneurysm. No pathologic adenopathy within
the abdomen and pelvis.

Reproductive: Status post hysterectomy. No adnexal masses.

Other: No abdominal wall hernia.

Musculoskeletal: Extensive thoracolumbar fusion with instrumentation
and posterior decompression at L1 and L3 has been performed. No
acute bone abnormality. No lytic or blastic bone lesion.
IMPRESSION: No acute intra-abdominal pathology identified. No definite
radiographic explanation for the patient's reported symptoms.

Stable right lower lobe 9 mm ground-glass pulmonary nodule since
remote prior examination of 07/07/2016. Follow-up examination in
6-12 months would help establish 5 years of stability and confirm an
underlying benign etiology.

Aortic Atherosclerosis (C56UT-PCI.I).

## 2021-08-21 IMAGING — CT CT HEAD W/O CM
4 series · 16 of 47 positions shown, 18 images · non-contrast
Comparison: 07/15/2020

CLINICAL DATA: Head trauma, fall

EXAM:
CT HEAD WITHOUT CONTRAST
TECHNIQUE: Contiguous axial images were obtained from the base of the skull
through the vertex without intravenous contrast.

[Series 3: head without · axial · non-contrast · 0.48mm/px · z∈[-26,+94]mm · 7 of 34 slices shown, 9 images]
[im 5/34  brain]
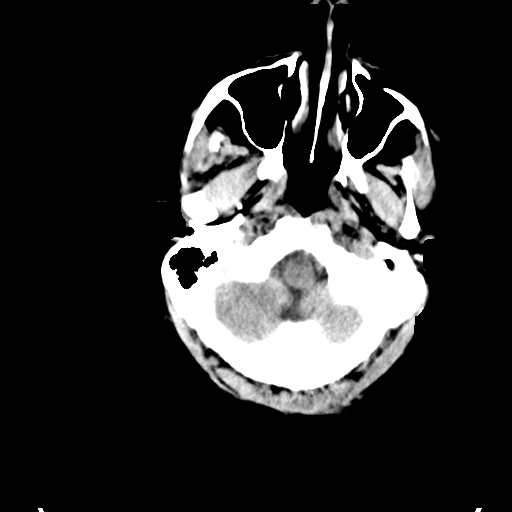
[im 5/34  bone]
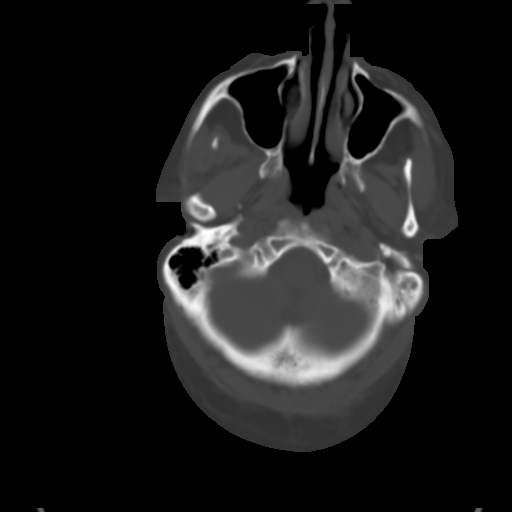
[im 9/34  brain]
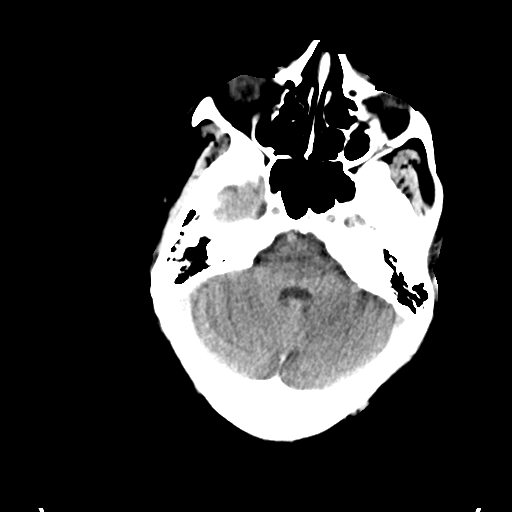
[im 13/34  brain]
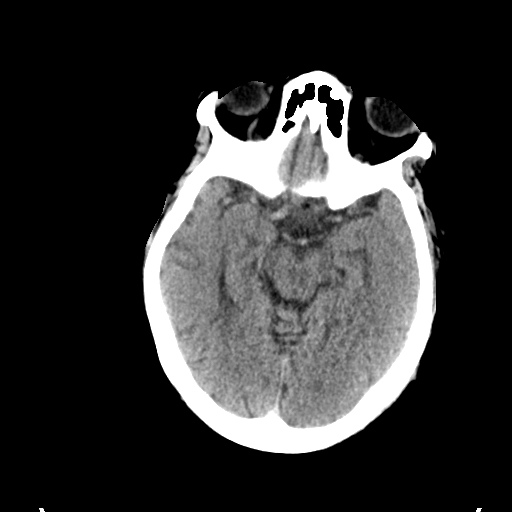
[im 17/34  brain]
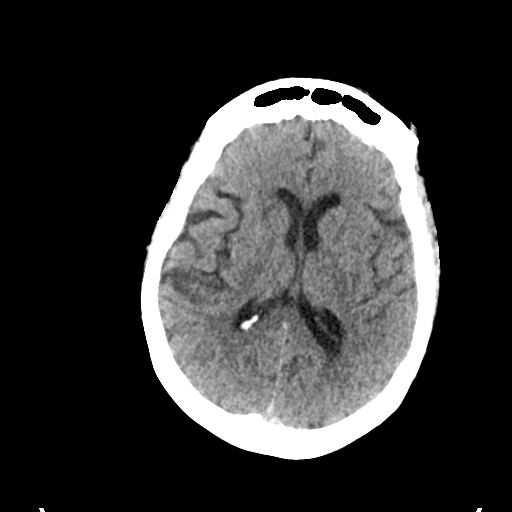
[im 21/34  brain]
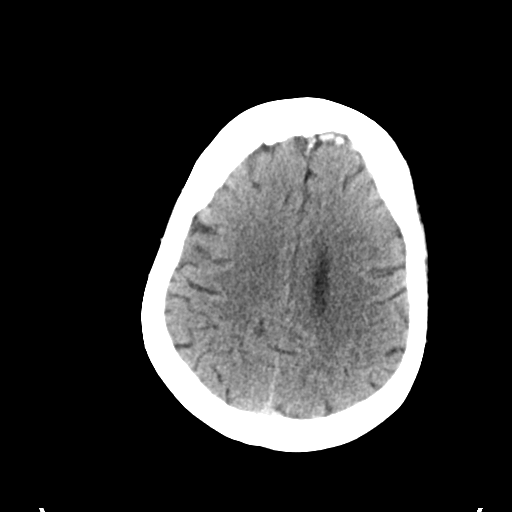
[im 21/34  bone]
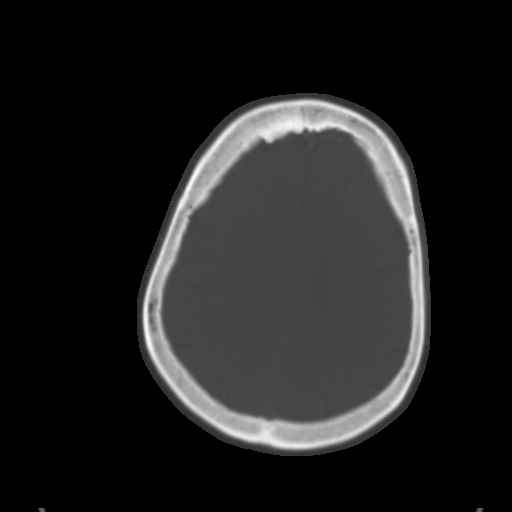
[im 25/34  brain]
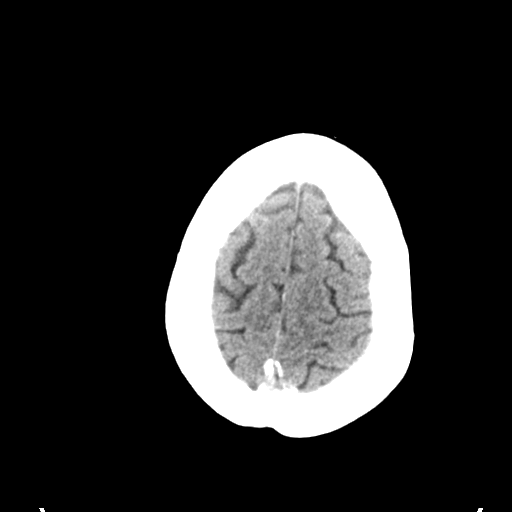
[im 29/34  brain]
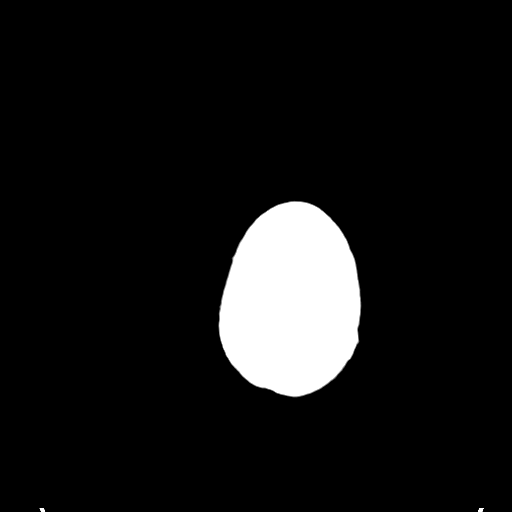

[Series 4: head bone · axial · 0.48mm/px · z∈[-30,+2]mm · 3 of 83 slices shown]
[im 9/83  bone]
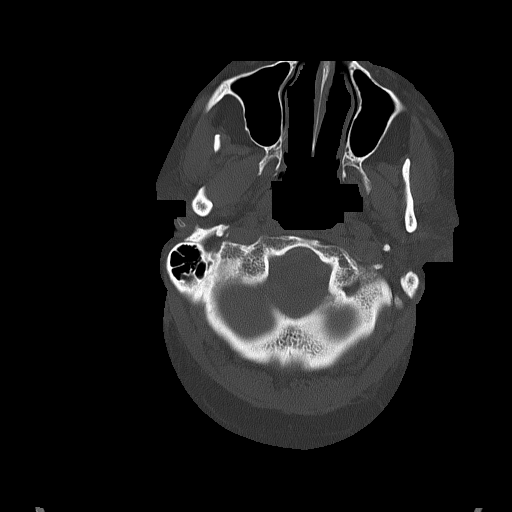
[im 17/83  bone]
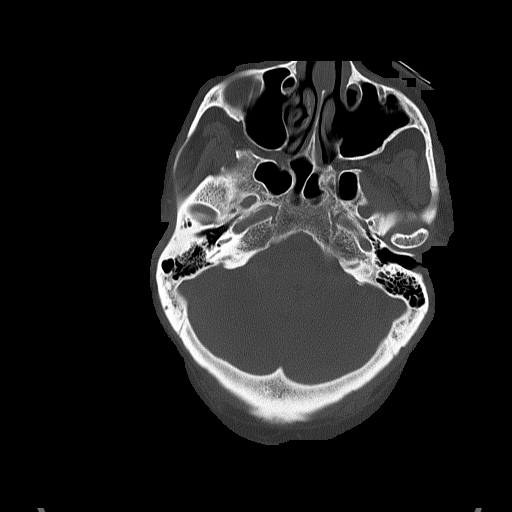
[im 25/83  bone]
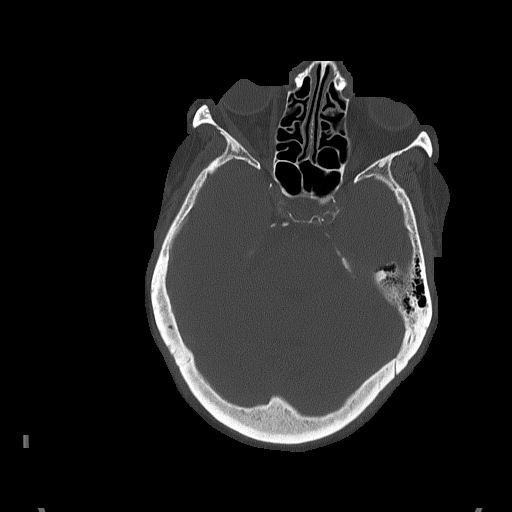

[Series 5: head without cor · coronal · non-contrast · 0.31mm/px · 3 of 79 slices shown]
[im 27/79  brain]
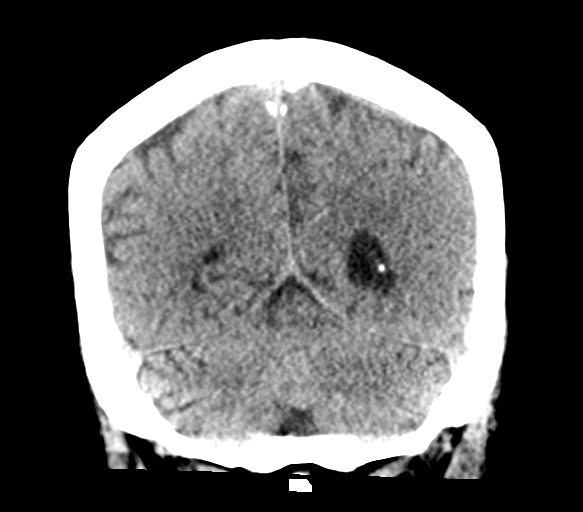
[im 35/79  brain]
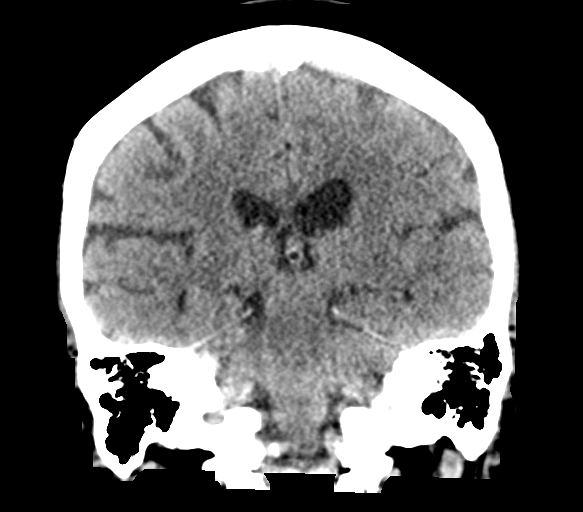
[im 44/79  brain]
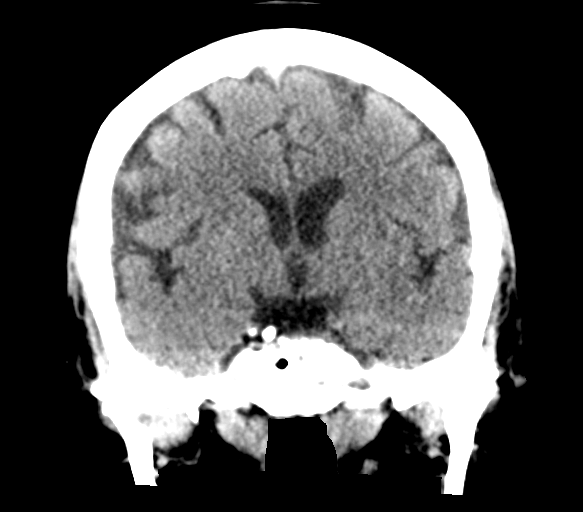

[Series 6: head without sag · sagittal · non-contrast · 0.34mm/px · 3 of 62 slices shown]
[im 21/62  brain]
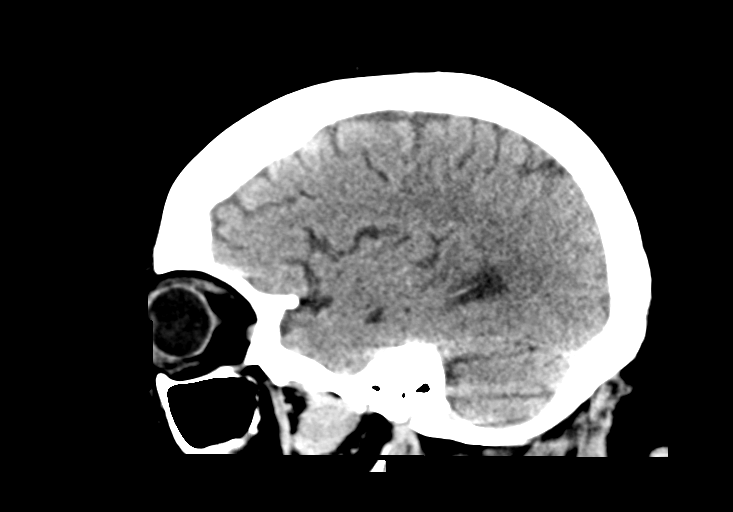
[im 31/62  brain]
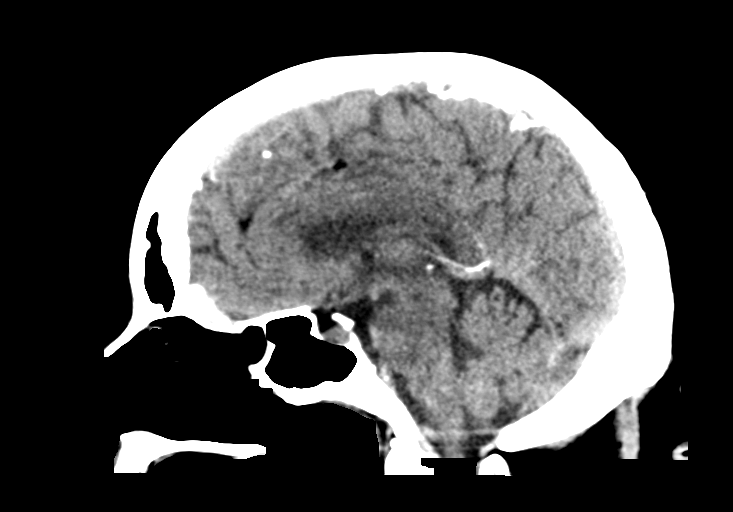
[im 41/62  brain]
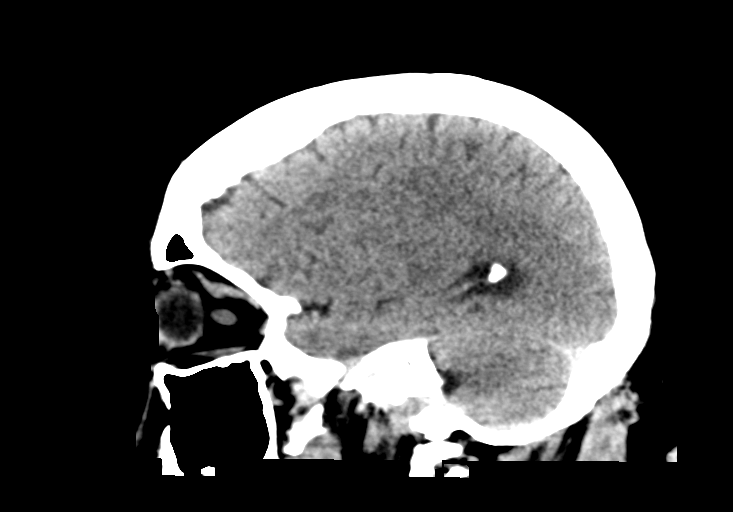

[16 of 47 positions shown; findings below may reference images not displayed]

FINDINGS: Brain: No acute intracranial abnormality. Specifically, no
hemorrhage, hydrocephalus, mass lesion, acute infarction, or
significant intracranial injury.

Vascular: No hyperdense vessel or unexpected calcification.

Skull: No acute calvarial abnormality.

Sinuses/Orbits: No acute findings

Other: None
IMPRESSION: No acute intracranial abnormality.

## 2021-08-24 ENCOUNTER — Other Ambulatory Visit: Payer: Self-pay | Admitting: Neurosurgery

## 2021-08-24 DIAGNOSIS — K2930 Chronic superficial gastritis without bleeding: Secondary | ICD-10-CM | POA: Diagnosis not present

## 2021-08-24 DIAGNOSIS — M48062 Spinal stenosis, lumbar region with neurogenic claudication: Secondary | ICD-10-CM | POA: Diagnosis not present

## 2021-08-28 ENCOUNTER — Other Ambulatory Visit (HOSPITAL_COMMUNITY): Payer: Self-pay | Admitting: Gastroenterology

## 2021-08-28 DIAGNOSIS — R1084 Generalized abdominal pain: Secondary | ICD-10-CM

## 2021-09-01 ENCOUNTER — Other Ambulatory Visit: Payer: Self-pay

## 2021-09-01 ENCOUNTER — Ambulatory Visit
Admission: RE | Admit: 2021-09-01 | Discharge: 2021-09-01 | Disposition: A | Discharge status: Home / Self Care | Payer: Medicare Other | Source: Ambulatory Visit | Attending: Neurosurgery | Admitting: Neurosurgery

## 2021-09-01 DIAGNOSIS — M4326 Fusion of spine, lumbar region: Secondary | ICD-10-CM | POA: Diagnosis not present

## 2021-09-01 DIAGNOSIS — M545 Low back pain, unspecified: Secondary | ICD-10-CM | POA: Diagnosis not present

## 2021-09-01 DIAGNOSIS — M48062 Spinal stenosis, lumbar region with neurogenic claudication: Secondary | ICD-10-CM

## 2021-09-05 ENCOUNTER — Other Ambulatory Visit: Payer: Self-pay

## 2021-09-05 ENCOUNTER — Ambulatory Visit (HOSPITAL_COMMUNITY)
Admission: RE | Admit: 2021-09-05 | Discharge: 2021-09-05 | Disposition: A | Discharge status: Home / Self Care | Payer: Medicare Other | Source: Ambulatory Visit | Attending: Gastroenterology | Admitting: Gastroenterology

## 2021-09-05 DIAGNOSIS — R1084 Generalized abdominal pain: Secondary | ICD-10-CM

## 2021-09-05 MED ORDER — TECHNETIUM TC 99M SULFUR COLLOID
2.1000 | Freq: Once | INTRAVENOUS | Status: AC | PRN
  Administered 2021-09-05: 2.1 via INTRAVENOUS

## 2021-09-06 ENCOUNTER — Other Ambulatory Visit: Payer: Medicare Other

## 2021-09-13 IMAGING — MR MR LUMBAR SPINE W/O CM
4 of 5 series · 20 of 48 positions shown · non-contrast
Comparison: Lumbar spine CT 01/19/2020

CLINICAL DATA: Acute bilateral low back pain without sciatica.
Multiple prior spine surgeries.

EXAM:
MRI LUMBAR SPINE WITHOUT CONTRAST
TECHNIQUE: Multiplanar, multisequence MR imaging of the lumbar spine was
performed. No intravenous contrast was administered.

[Series 10: T1 · sagittal · 4.0mm · 0.73mm/px · 3 of 19 slices shown (1 of 2)]
[im 4/19]
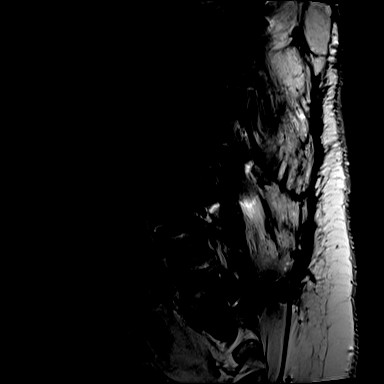
[im 11/19]
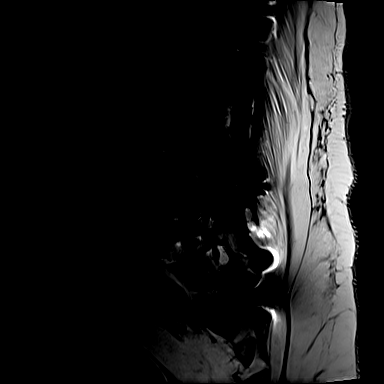
[im 19/19]
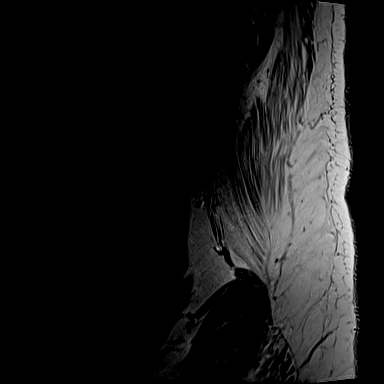

[Series 12: T2 · sagittal · 4.0mm · 0.73mm/px · 7 of 19 slices shown (1 of 2)]
[im 1/19]
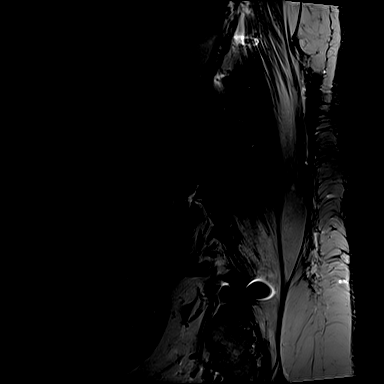
[im 4/19]
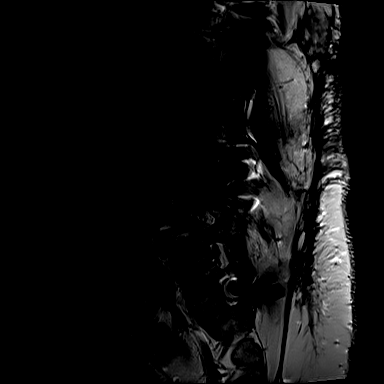
[im 7/19]
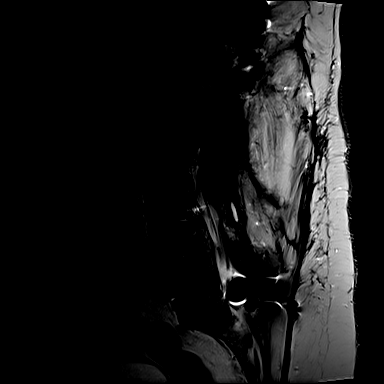
[im 10/19]
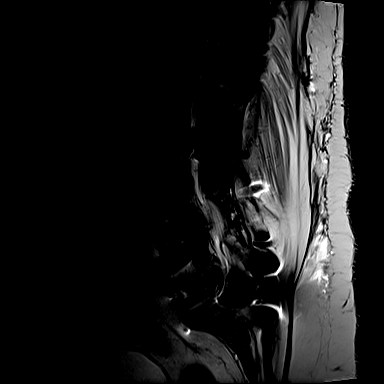
[im 13/19]
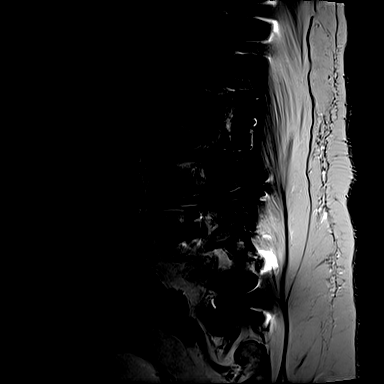
[im 16/19]
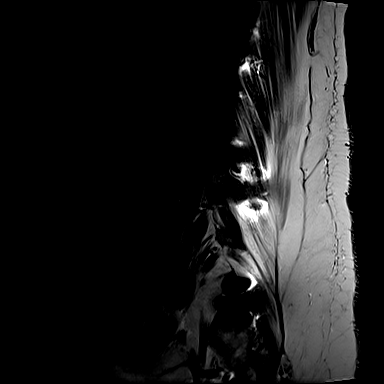
[im 19/19]
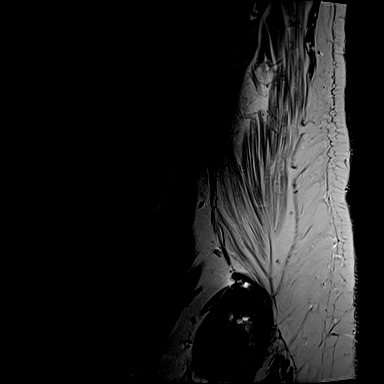

[Series 15: T1 · axial · 4.0mm · 0.28mm/px · z∈[-441,-284]mm · 3 of 38 slices shown (2 of 2)]
[im 6/38]
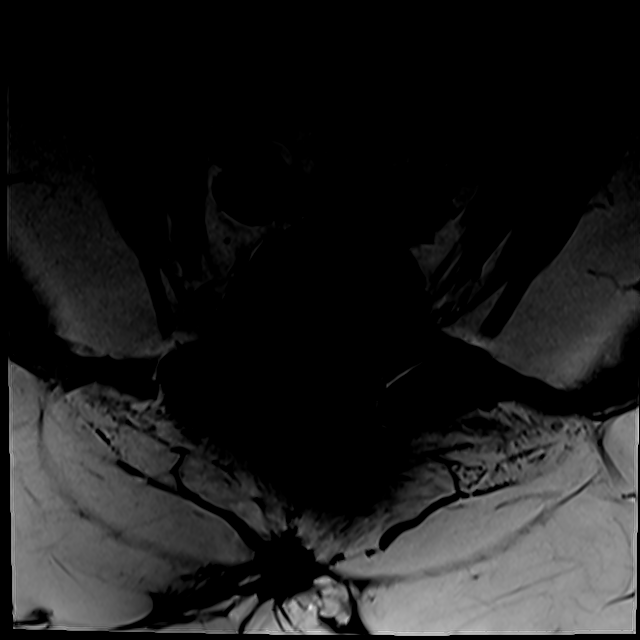
[im 20/38]
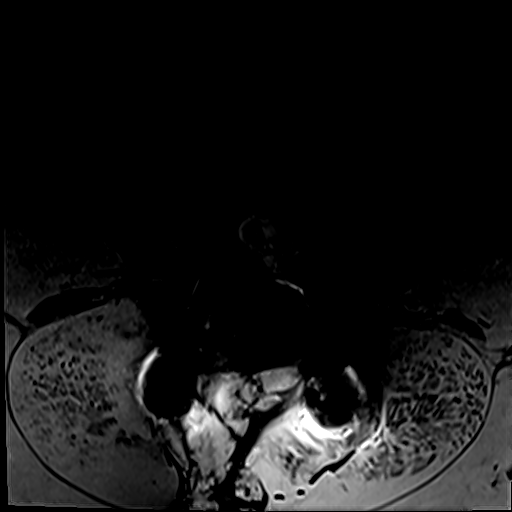
[im 32/38]
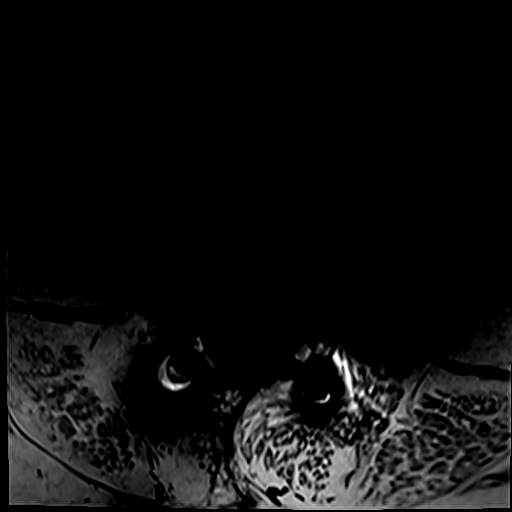

[Series 18: T2 · axial · 4.0mm · 0.28mm/px · z∈[-464,-284]mm · 7 of 38 slices shown (2 of 2)]
[im 1/38]
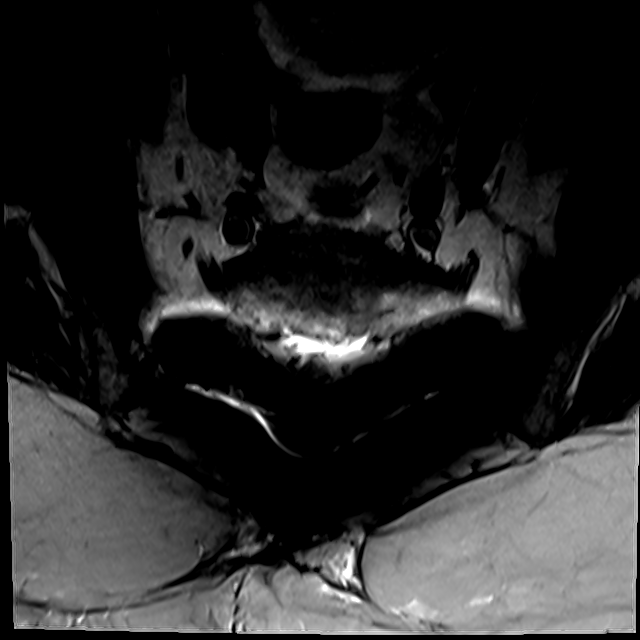
[im 6/38]
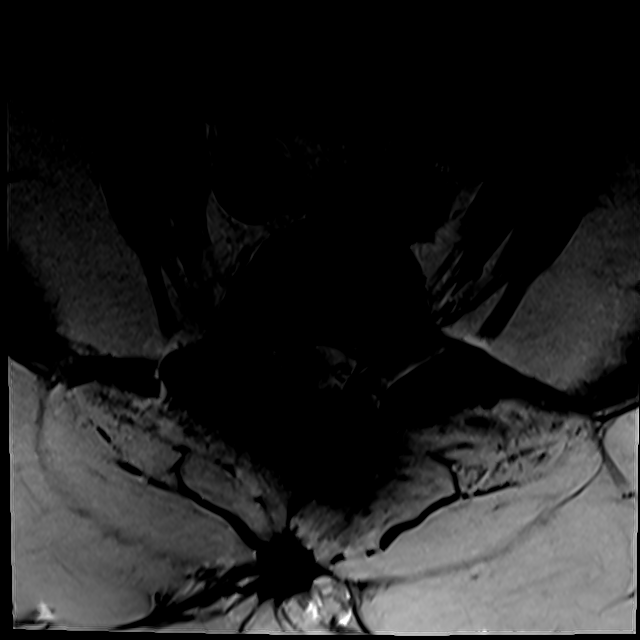
[im 12/38]
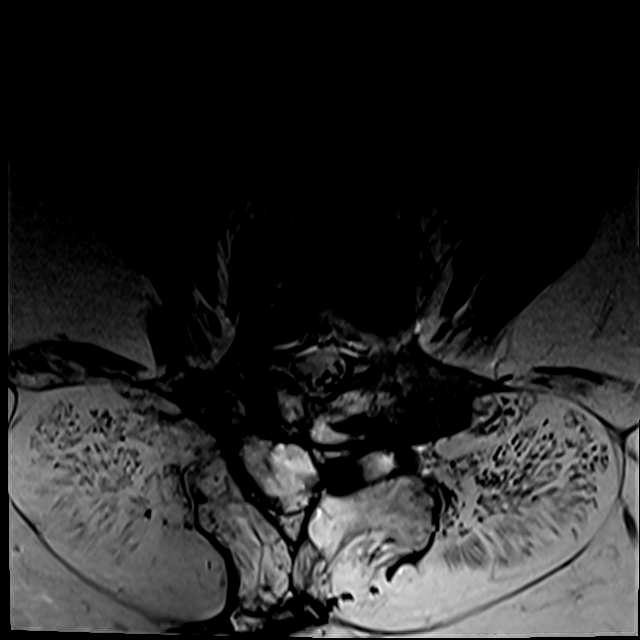
[im 18/38]
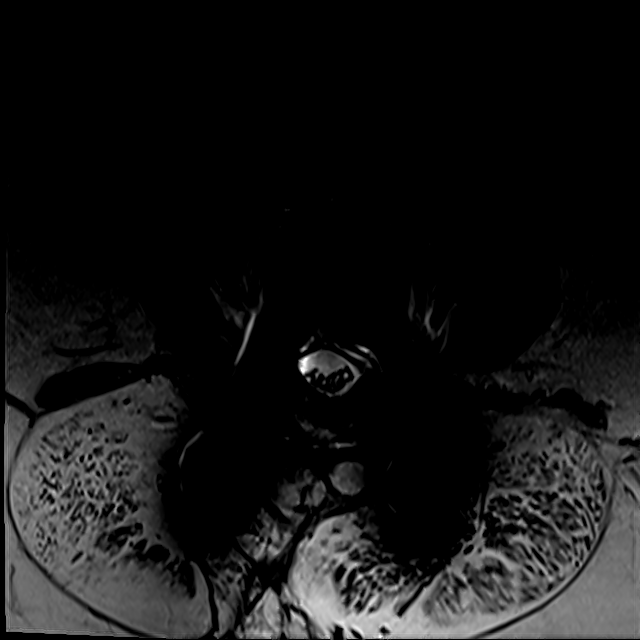
[im 20/38]
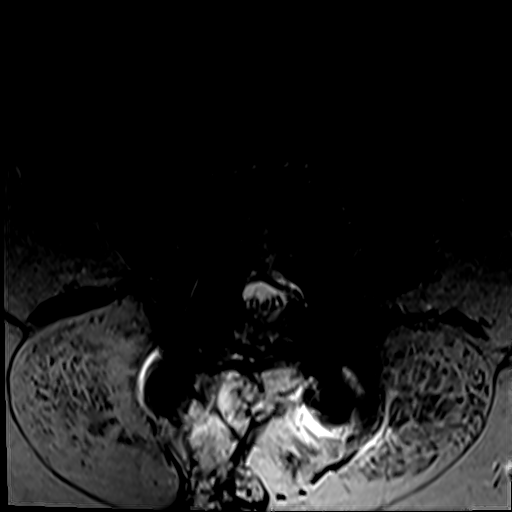
[im 26/38]
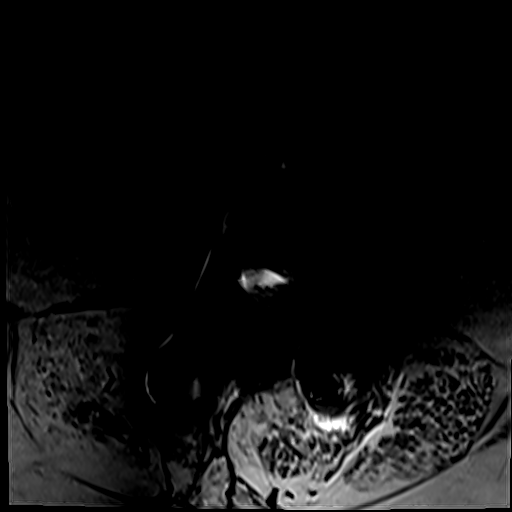
[im 32/38]
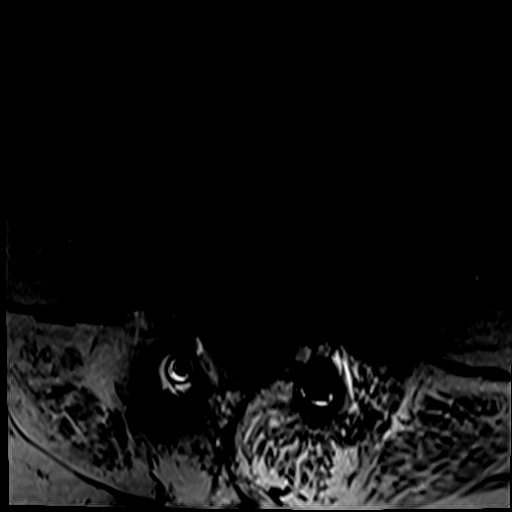

[20 of 48 positions shown; findings below may reference images not displayed]

FINDINGS: Segmentation:  Standard.

Alignment:  Normal.

Vertebrae: Sequelae of thoracolumbar fusion are again noted with a
posterior pedicle screw and rod construct extending from T10-L3 with
lack of screws at L1. Interbody cages are again noted at L1-2 and
L3-4, and there are anterior plates and screws related to prior L4-5
and L5-S1 anterior fusions. Screws are again noted traversing the
facet joints at L5-S1. Within limitations of susceptibility artifact
from hardware, no fracture, suspicious marrow lesion, or significant
marrow edema is identified in the lumbar spine.

Conus medullaris and cauda equina: Conus extends to approximately
the L1 level, partially obscured by artifact.

Paraspinal and other soft tissues: Postoperative changes in the
posterior thoracolumbar soft tissues with extensive paraspinal
muscle atrophy bilaterally. Small fluid collection in the
laminectomy bed at L3-4.

Disc levels:

T12-L1: Posterior fusion.  No disc herniation or stenosis.

L1-2: Discectomy and fusion with limited assessment of the spinal
canal and neural foramina due to artifact from the interbody
spacers. The prior CT demonstrated asymmetric posterior positioning
of the right-sided interbody spacer resulting in mild right neural
foraminal stenosis as well as mild left-sided neural foraminal
stenosis due to spurring.

L2-3: Posterior fusion. Mild disc bulging and facet hypertrophy
without evidence of significant stenosis.

L3-4: Wide posterior decompression, discectomy, and fusion. No
stenosis.

L4-5: Posterior decompression and anterior and posterior fusion. No
stenosis.

L5-S1: Anterior and posterior fusion with limited assessment of the
spinal canal and neural foramina due to artifact although the neural
foramina appeared patent on the prior CT.
IMPRESSION: Extensive postsurgical changes throughout the lumbar spine as
detailed above. No acute finding or evidence of a compressive
stenosis within limitations of artifact.

## 2021-09-13 IMAGING — MR MR THORACIC SPINE W/O CM
4 of 5 series · 21 of 48 positions shown · non-contrast
Comparison: CT of the thoracic spine January 19, 2020

CLINICAL DATA: Back pain. Patient reports falling out of bed. Prior
back surgery.

EXAM:
MRI THORACIC SPINE WITHOUT CONTRAST
TECHNIQUE: Multiplanar, multisequence MR imaging of the thoracic spine was
performed. No intravenous contrast was administered.

[Series 16: t1_tse_sag count · sagittal · 3.0mm · 1.06mm/px · 3 of 13 slices shown]
[im 1/13]
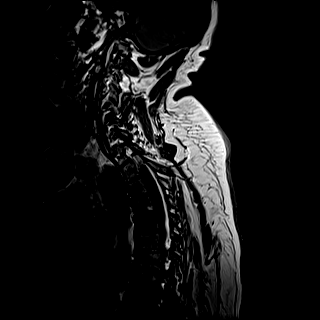
[im 9/13]
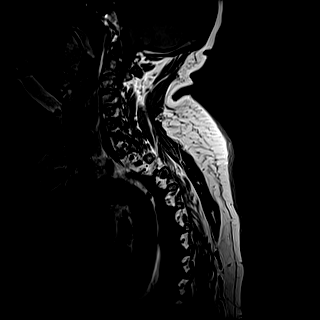
[im 13/13]
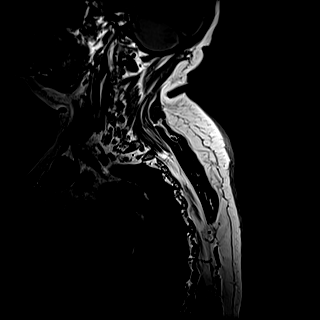

[Series 22: STIR · sagittal · 3.0mm · 1.00mm/px · 3 of 19 slices shown]
[im 4/19]
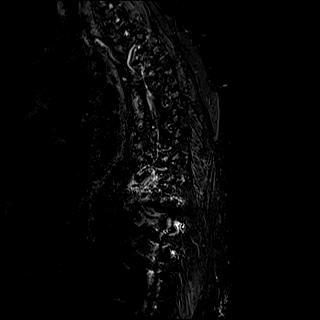
[im 10/19]
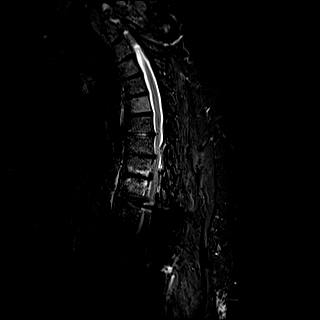
[im 16/19]
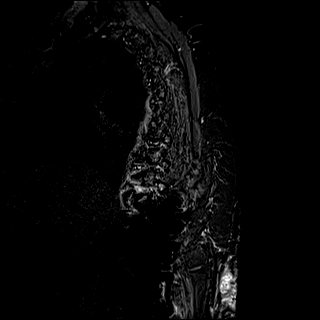

[Series 23: T2 · sagittal · 3.0mm · 0.83mm/px · 7 of 19 slices shown (1 of 2)]
[im 1/19]
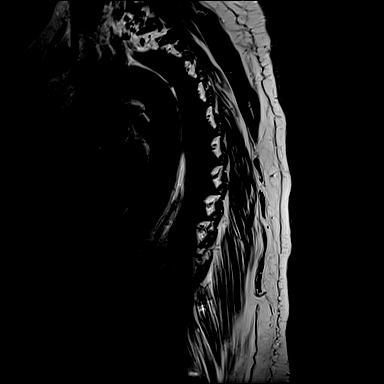
[im 4/19]
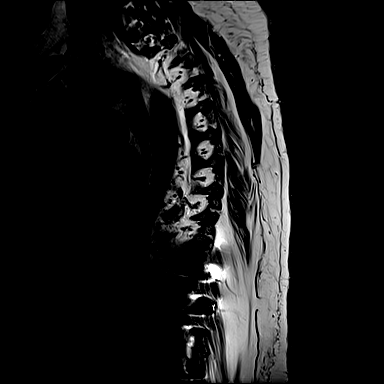
[im 7/19]
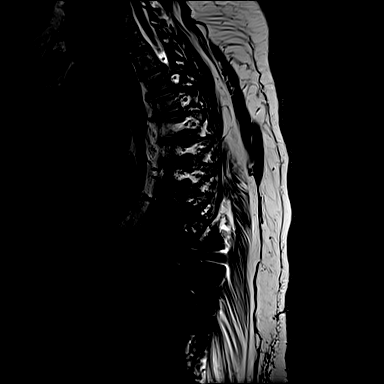
[im 10/19]
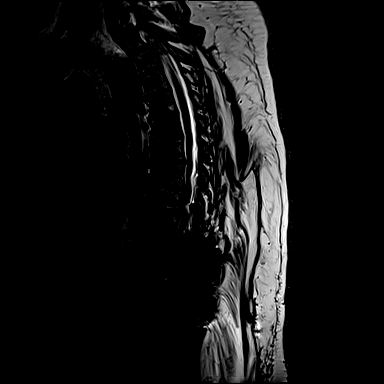
[im 13/19]
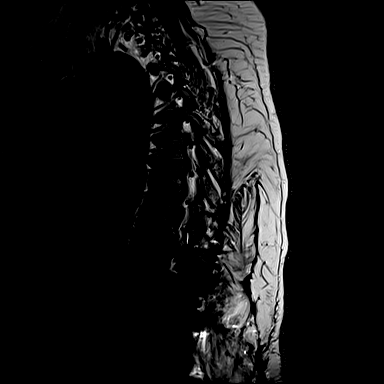
[im 16/19]
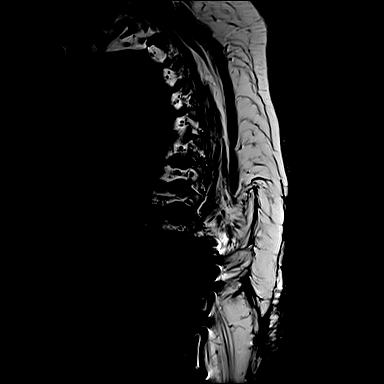
[im 19/19]
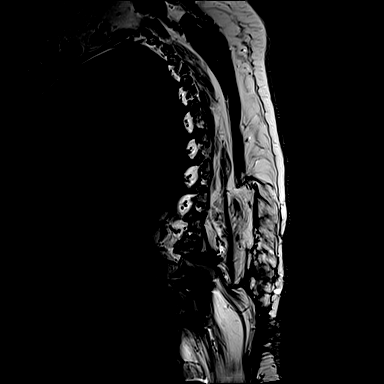

[Series 24: T2 · axial · 4.0mm · 0.28mm/px · z∈[-256,-78]mm · 8 of 39 slices shown (2 of 2)]
[im 1/39]
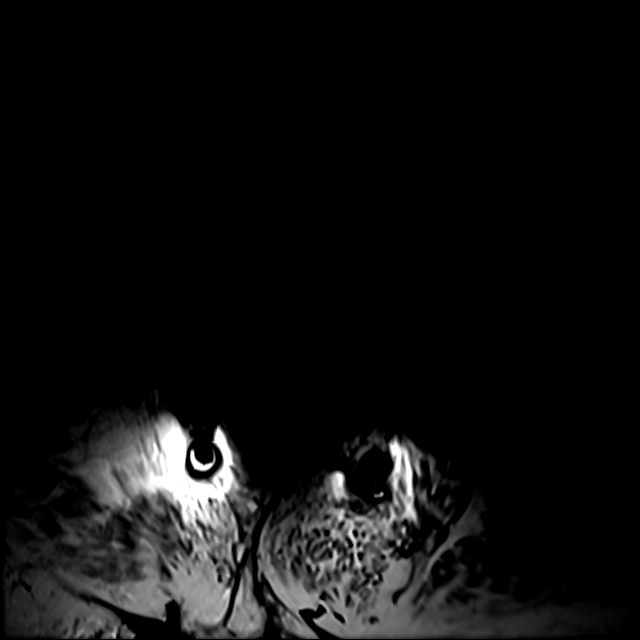
[im 6/39]
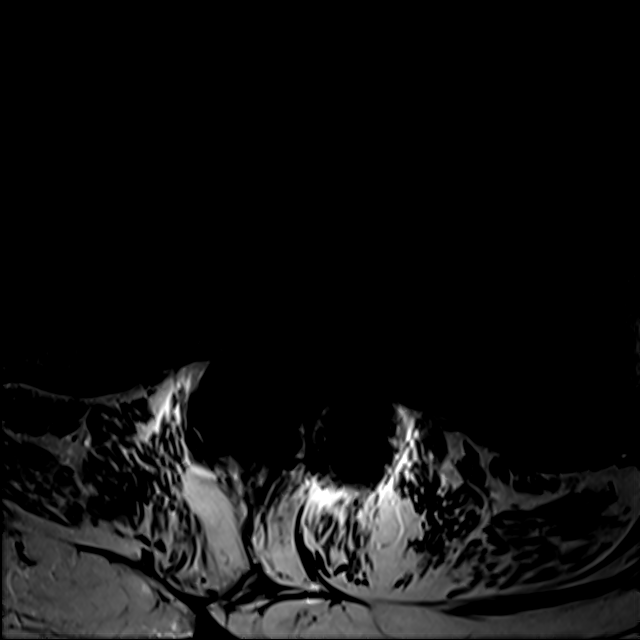
[im 11/39]
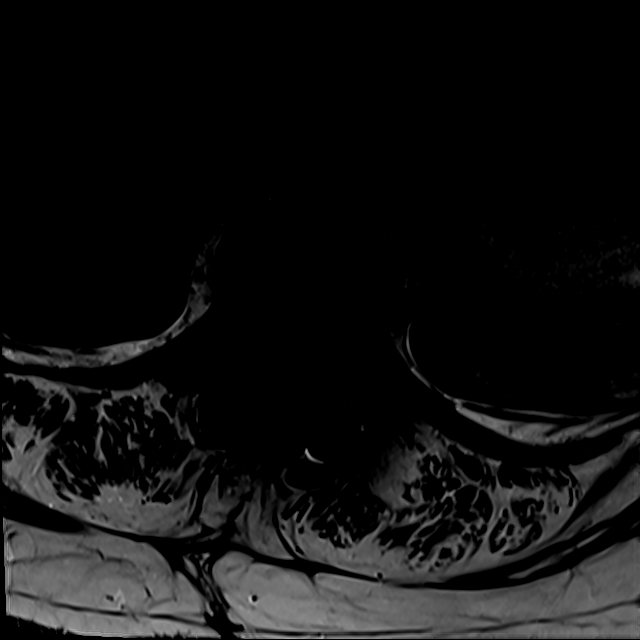
[im 17/39]
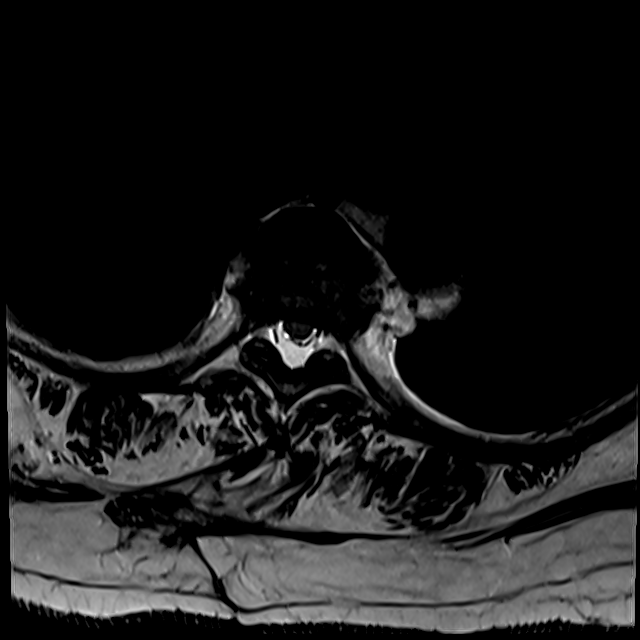
[im 20/39]
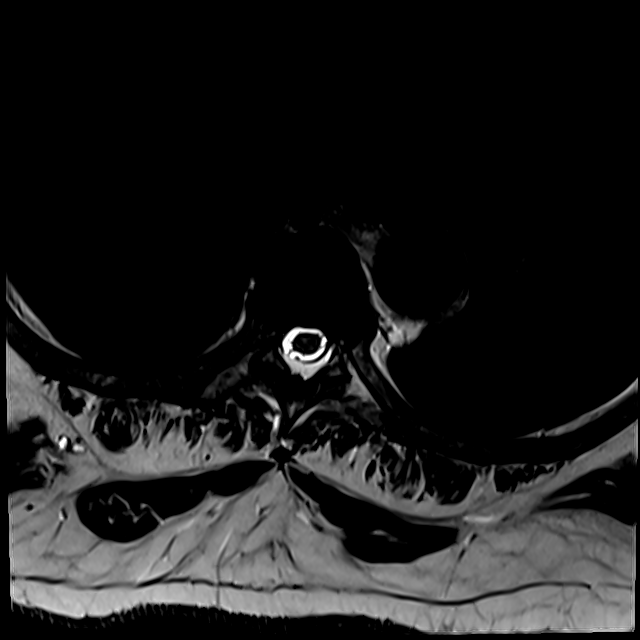
[im 22/39]
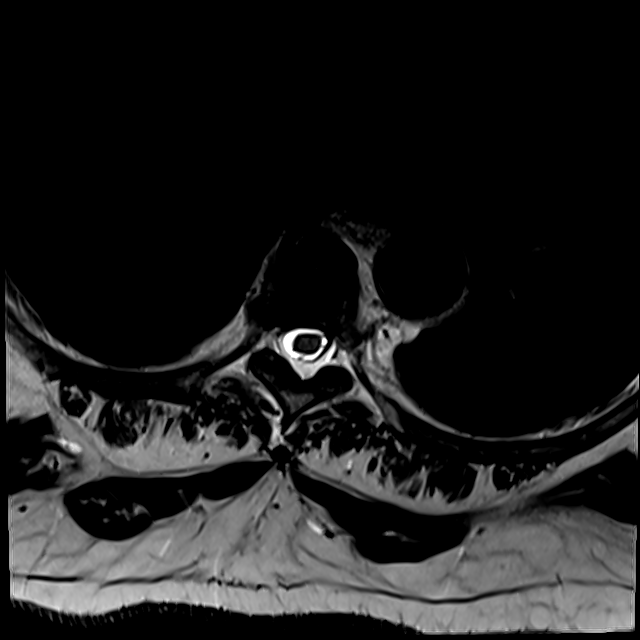
[im 28/39]
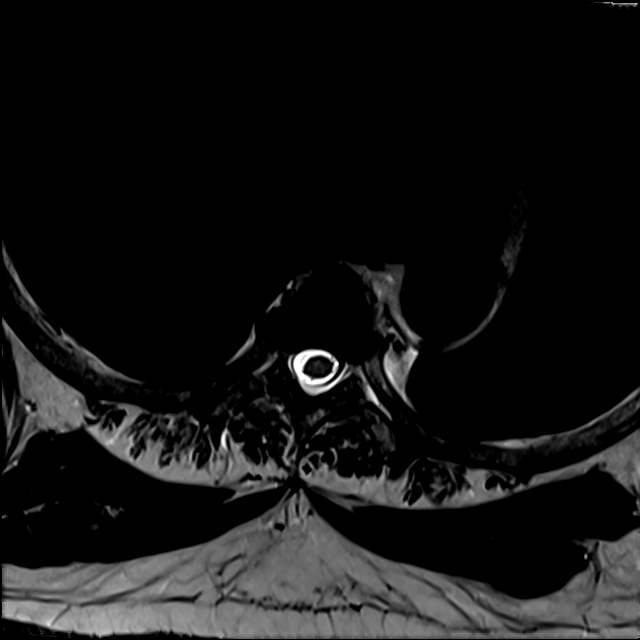
[im 33/39]
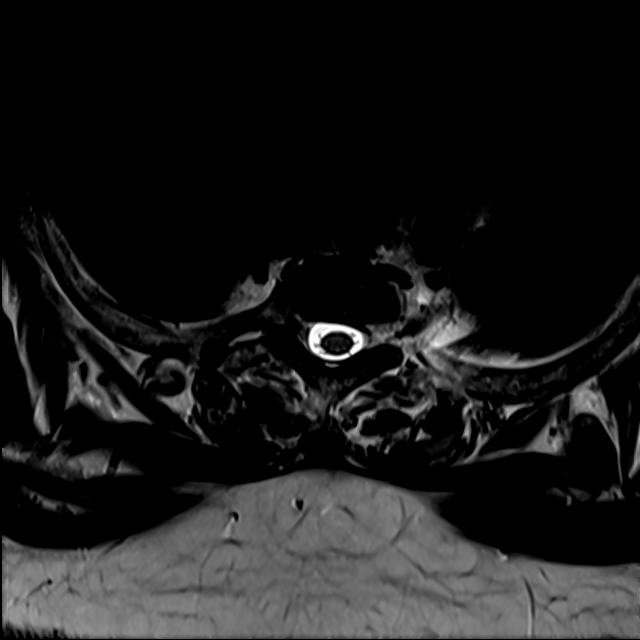

[21 of 48 positions shown; findings below may reference images not displayed]

FINDINGS: Alignment: Mildly exaggerated thoracic kyphosis. No substantial
sagittal subluxation.

Vertebrae: Infiltrating T1 hypointense and STIR signal within the
right eccentric T5 vertebral body without height loss to suggest
fracture (for example see series 21 and 22, image 9). No abnormality
seen at this level on prior CT from 0701. Marrow edema about the
T8-T9 disc space appears degenerative/discogenic without height
loss. Posterior fusion extending from T10 inferiorly into the lumbar
spine. Metallic artifact at these levels limits evaluation.

Cord:  Normal cord signal.

Paraspinal and other soft tissues: Unremarkable.

Disc levels:

Posterior disc bulges at T7-T8, T8-T9 and T9-T10 and prominent
dorsal epidural fat with resulting moderate canal stenosis at these
levels.

Multilevel facet hypertrophy with moderate to severe bilateral
foraminal stenosis at T8-T9 and T9-T10.

Limited evaluation at the levels of fusion due to extensive metallic
artifact.
IMPRESSION: 1. Infiltrating T1 hypointense and STIR signal within the right
eccentric T5 vertebral body without height loss to suggest fracture.
This finding raises concern for possible underlying lesion, most
likely a metastasis or multiple myeloma in this age group.
2. Marrow edema about the T8-T9 disc space appears
degenerative/discogenic (particularly given adjacent level to the
fusion) without height loss to suggest acute fracture.
3. Posterior disc bulges at T7-T8, T8-T9 and T9-T10 with prominent
dorsal epidural fat. Resulting moderate canal stenosis at these
levels.
4. Multilevel facet hypertrophy with moderate to severe bilateral
foraminal stenosis at T8-T9 and T9-T10.
5. Posterior fusion extending from T10 inferiorly into the lumbar
spine with limited evaluation at these levels due to extensive
metallic artifact.

These results will be called to the ordering clinician or
representative by the Radiologist Assistant, and communication
documented in the PACS or [REDACTED].

## 2021-09-26 DIAGNOSIS — G8929 Other chronic pain: Secondary | ICD-10-CM | POA: Diagnosis not present

## 2021-09-26 DIAGNOSIS — E7800 Pure hypercholesterolemia, unspecified: Secondary | ICD-10-CM | POA: Diagnosis not present

## 2021-09-26 DIAGNOSIS — K224 Dyskinesia of esophagus: Secondary | ICD-10-CM | POA: Diagnosis not present

## 2021-09-26 DIAGNOSIS — R911 Solitary pulmonary nodule: Secondary | ICD-10-CM | POA: Diagnosis not present

## 2021-09-26 DIAGNOSIS — I10 Essential (primary) hypertension: Secondary | ICD-10-CM | POA: Diagnosis not present

## 2021-09-26 DIAGNOSIS — I5189 Other ill-defined heart diseases: Secondary | ICD-10-CM | POA: Diagnosis not present

## 2021-09-26 DIAGNOSIS — I700 Atherosclerosis of aorta: Secondary | ICD-10-CM | POA: Diagnosis not present

## 2021-09-28 ENCOUNTER — Other Ambulatory Visit: Payer: Self-pay | Admitting: Neurosurgery

## 2021-09-28 DIAGNOSIS — M48062 Spinal stenosis, lumbar region with neurogenic claudication: Secondary | ICD-10-CM

## 2021-09-28 DIAGNOSIS — Z6841 Body Mass Index (BMI) 40.0 and over, adult: Secondary | ICD-10-CM | POA: Diagnosis not present

## 2021-09-28 DIAGNOSIS — I10 Essential (primary) hypertension: Secondary | ICD-10-CM | POA: Diagnosis not present

## 2021-09-29 DIAGNOSIS — Z20822 Contact with and (suspected) exposure to covid-19: Secondary | ICD-10-CM | POA: Diagnosis not present

## 2021-10-10 DIAGNOSIS — Z23 Encounter for immunization: Secondary | ICD-10-CM | POA: Diagnosis not present

## 2021-10-12 IMAGING — PT NM PET TUM IMG INITIAL (PI) WHOLE BODY
1 series · 2 of 2 positions shown · non-contrast
Comparison: Thoracic MRI 04/12/2021.

CLINICAL DATA: Initial treatment strategy for lumbar spine lesion.
Concern for malignancy.

EXAM:
NUCLEAR MEDICINE PET WHOLE BODY
TECHNIQUE: 14.6 mCi F-18 FDG was injected intravenously. Full-ring PET imaging
was performed from the head to foot after the radiotracer. CT data
was obtained and used for attenuation correction and anatomic
localization.
Fasting blood glucose: 98 mg/dl

[Series 1098: results mm oncology reading · 1.4mm · 0.80mm/px · 2 of 2 slices shown]
[im 1/2]
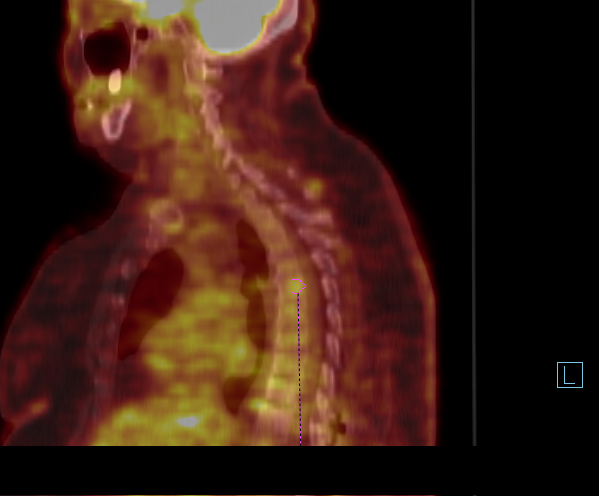
[im 2/2]
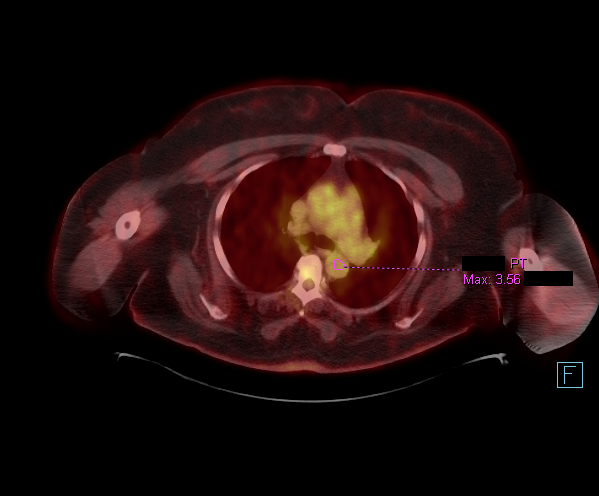

[2 of 2 positions shown; findings below may reference images not displayed]

FINDINGS: Mediastinal blood pool activity: SUV max

HEAD/NECK: No hypermetabolic cervical lymph nodes.

Incidental CT findings: none

CHEST: No hypermetabolic mediastinal or hilar nodes. No suspicious
pulmonary nodules on the CT scan.

Incidental CT findings: Nodule RIGHT middle lobe measuring 8 mm does
not have radiotracer activity.

Nodule in the inferior lingula versus atelectasis measures 9 mm
(image 99/4). No hypermetabolic activity.

ABDOMEN/PELVIS: No abnormal hypermetabolic activity within the
liver, pancreas, adrenal glands, or spleen. No hypermetabolic lymph
nodes in the abdomen or pelvis.

Incidental CT findings: none

SKELETON: Heterogeneous marrow activity within the lumbar and lower
thoracic spine. While there is mildly elevated uptake at the T5
vertebral body level (SUV max equal 4.1), activity is very similar
to multiple levels below. No convincing evidence of malignancy by
FDG PET scanning.

Incidental CT findings: Posterior lumbar fusion. Degenerative change
in the thoracolumbar junction with endplate sclerosis.

EXTREMITIES: No abnormal hypermetabolic activity in the lower
extremities.

Incidental CT findings: none
IMPRESSION: 1. No convincing evidence malignancy within the thoracic spine.
Heterogeneous marrow activity throughout the spine is favored
benign.
2. Posterior lumbar fusion without acute findings.
3. Nodule within the RIGHT lower lobe and LEFT lower lobe without
metabolic activity. Recommend follow-up CT in 6-9 months without
contrast to assess for stability.

## 2021-10-23 ENCOUNTER — Ambulatory Visit
Admission: RE | Admit: 2021-10-23 | Discharge: 2021-10-23 | Disposition: A | Discharge status: Home / Self Care | Payer: Medicare Other | Source: Ambulatory Visit | Attending: Neurosurgery | Admitting: Neurosurgery

## 2021-10-23 ENCOUNTER — Other Ambulatory Visit: Payer: Self-pay

## 2021-10-23 DIAGNOSIS — M549 Dorsalgia, unspecified: Secondary | ICD-10-CM | POA: Diagnosis not present

## 2021-10-23 DIAGNOSIS — M48062 Spinal stenosis, lumbar region with neurogenic claudication: Secondary | ICD-10-CM

## 2021-10-31 ENCOUNTER — Other Ambulatory Visit: Payer: Self-pay

## 2021-10-31 MED ORDER — FUROSEMIDE 20 MG PO TABS: 20.0000 mg | ORAL_TABLET | Freq: Every day | ORAL | 3 refills | Status: DC | PRN

## 2021-11-07 DIAGNOSIS — M545 Low back pain, unspecified: Secondary | ICD-10-CM | POA: Diagnosis not present

## 2021-11-07 DIAGNOSIS — M5414 Radiculopathy, thoracic region: Secondary | ICD-10-CM | POA: Diagnosis not present

## 2021-12-07 IMAGING — DX DG SHOULDER 2+V*R*
4 series · 4 of 4 positions shown · non-contrast
Comparison: 06/27/2016

CLINICAL DATA: Right shoulder pain, fell

EXAM:
RIGHT SHOULDER - 2+ VIEW

[shoulder grashey]
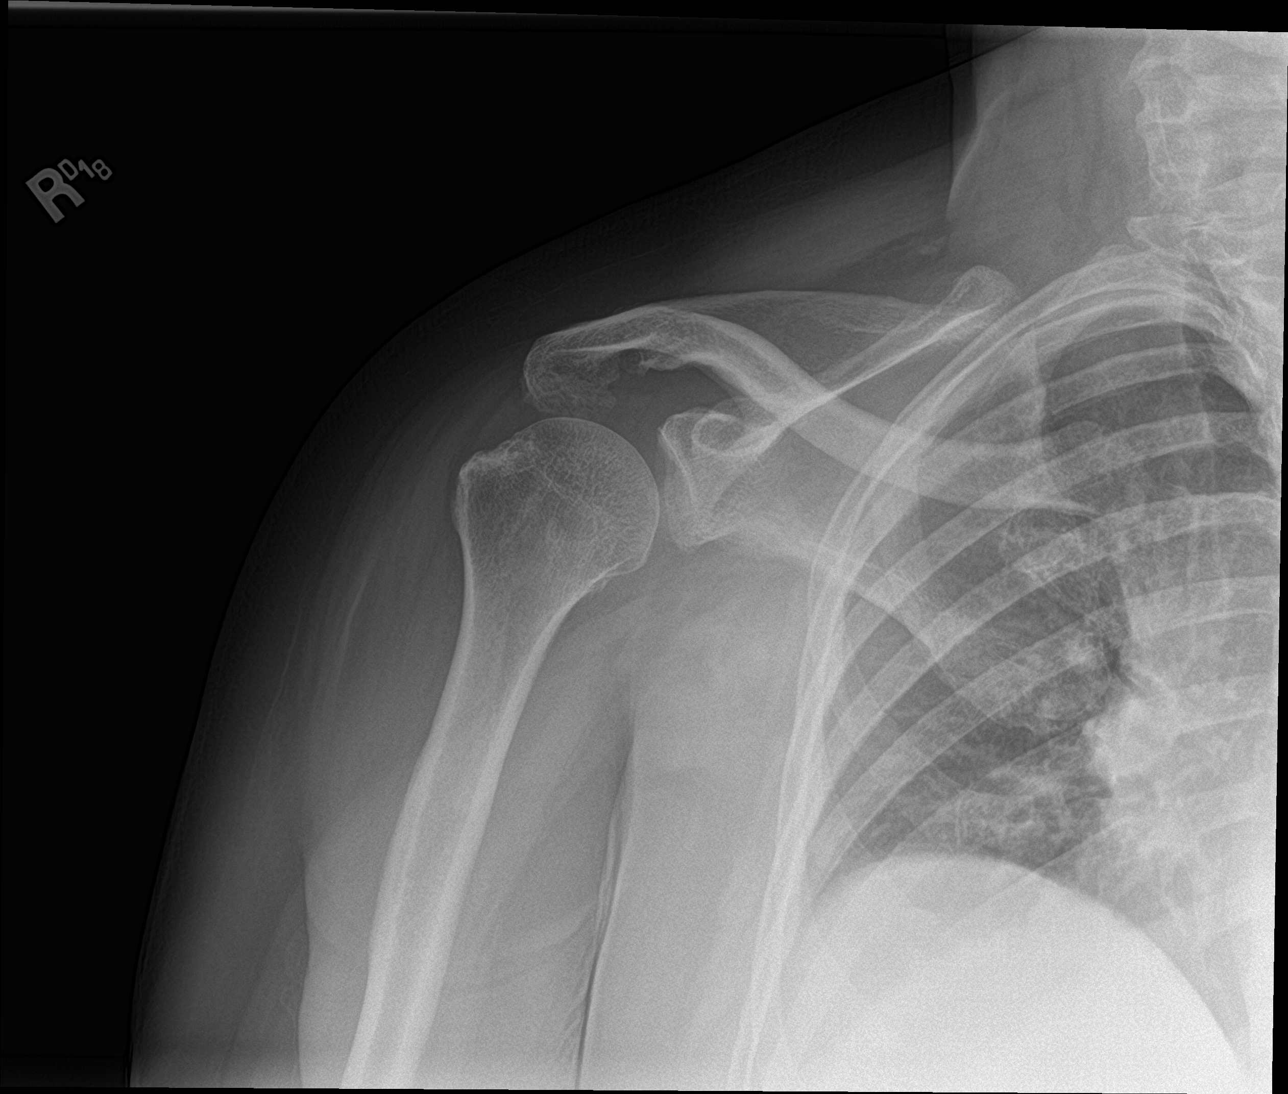

[shoulder y view]
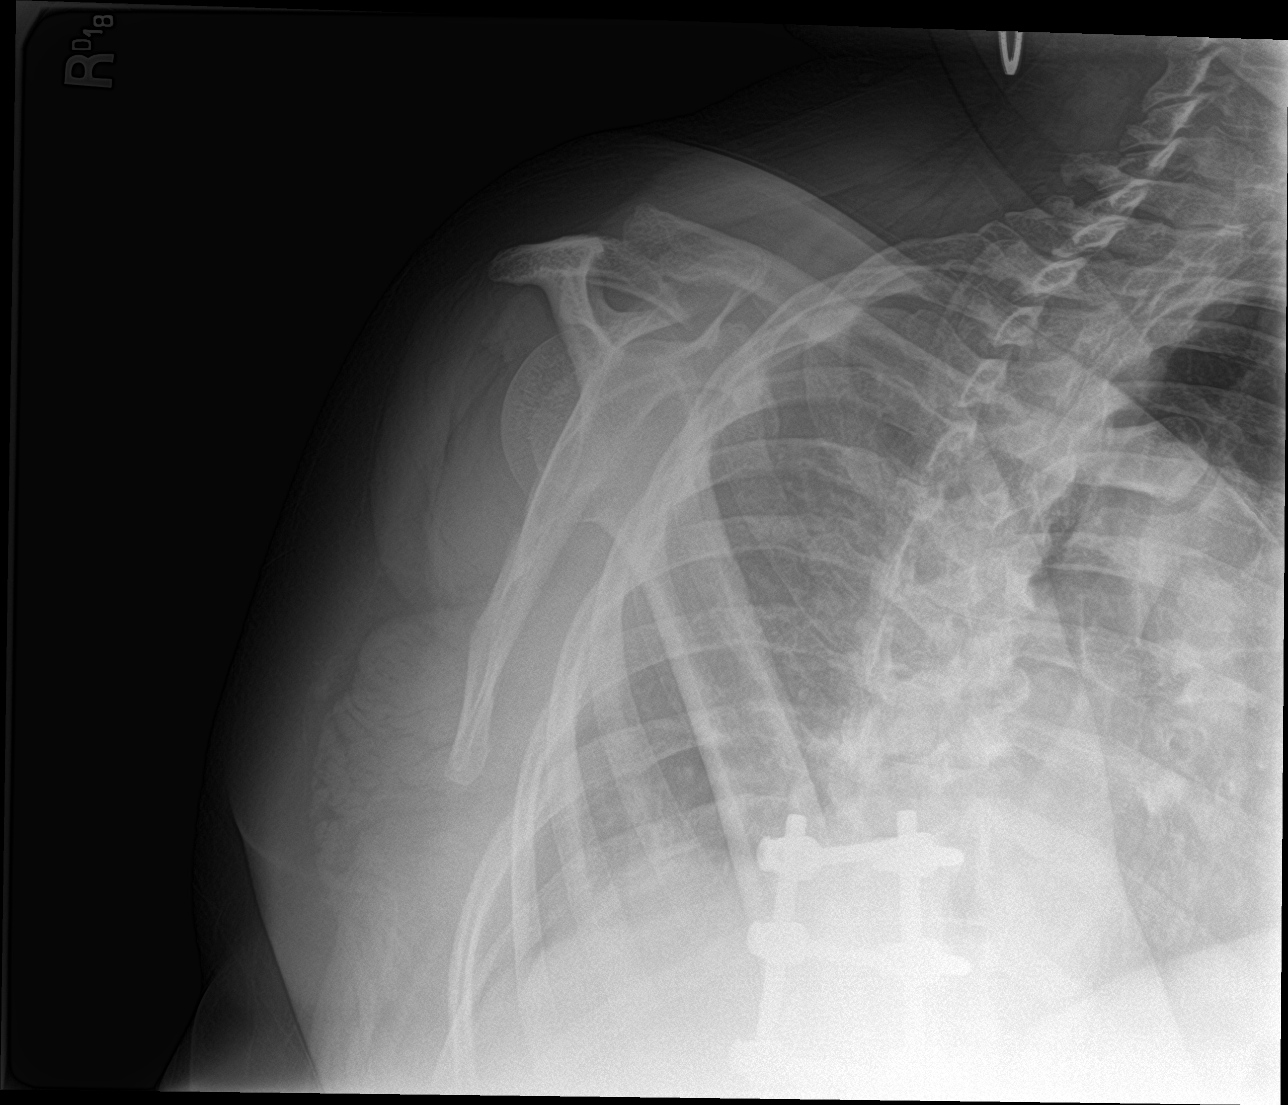

[shoulder ap neutral]
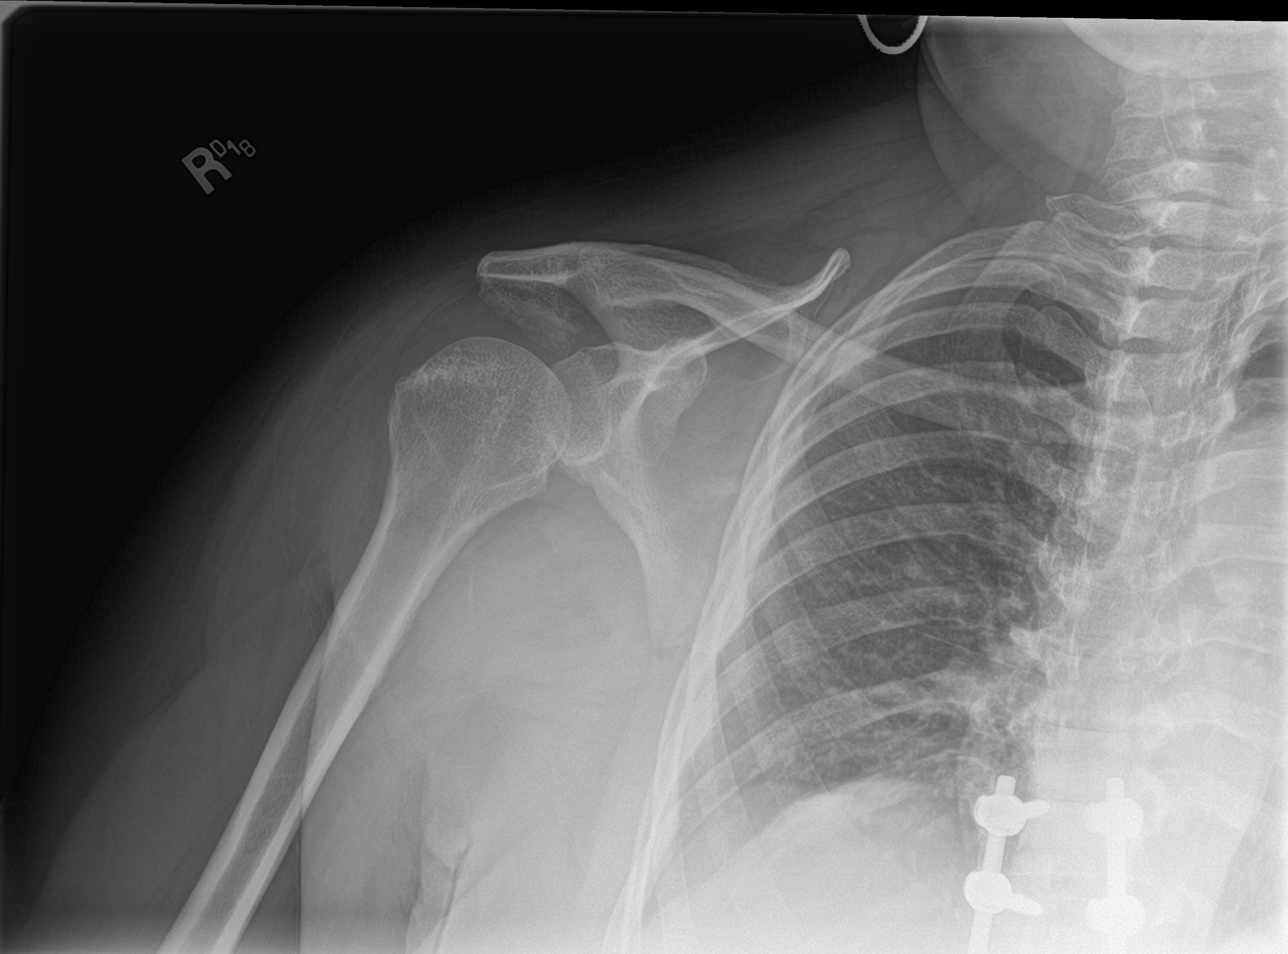

[shoulder axillary]
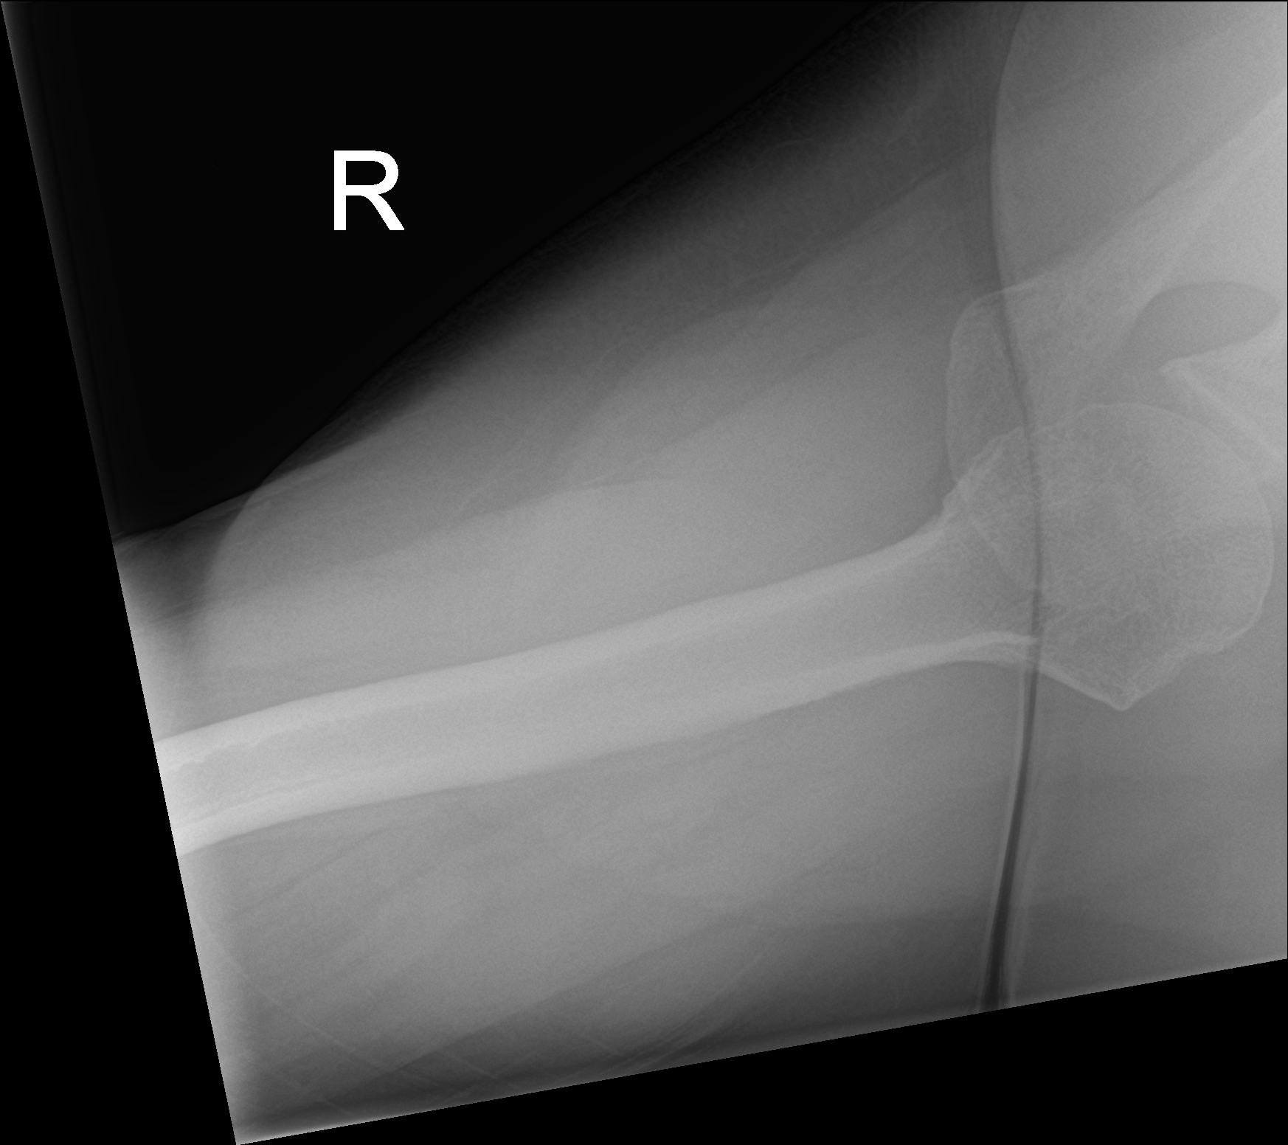

[4 of 4 positions shown; findings below may reference images not displayed]

FINDINGS: Internal rotation, external rotation, transscapular, and axillary
views of the right shoulder are obtained. No acute fracture. Mild
superior subluxation of the distal clavicle relative to the acromion
process on the transscapular view, which could reflect mild shoulder
separation. There is mild glenohumeral osteoarthritis. There is
prominent hypertrophy of the acromioclavicular joint, with
significant spurring of the undersurface of the acromion process.
Soft tissues are unremarkable. Right chest is clear.
IMPRESSION: 1. Prominent osteoarthritis about the right shoulder. No acute
displaced fracture.
2. Mild separation of the right acromioclavicular joint, only well
visualized on the transscapular view.

## 2021-12-12 DIAGNOSIS — Z20822 Contact with and (suspected) exposure to covid-19: Secondary | ICD-10-CM | POA: Diagnosis not present

## 2021-12-28 DIAGNOSIS — K5900 Constipation, unspecified: Secondary | ICD-10-CM | POA: Diagnosis not present

## 2021-12-28 DIAGNOSIS — G629 Polyneuropathy, unspecified: Secondary | ICD-10-CM | POA: Diagnosis not present

## 2021-12-28 DIAGNOSIS — K5901 Slow transit constipation: Secondary | ICD-10-CM | POA: Diagnosis not present

## 2021-12-28 DIAGNOSIS — G8929 Other chronic pain: Secondary | ICD-10-CM | POA: Diagnosis not present

## 2021-12-28 DIAGNOSIS — B9689 Other specified bacterial agents as the cause of diseases classified elsewhere: Secondary | ICD-10-CM | POA: Diagnosis not present

## 2021-12-28 DIAGNOSIS — N898 Other specified noninflammatory disorders of vagina: Secondary | ICD-10-CM | POA: Diagnosis not present

## 2021-12-28 DIAGNOSIS — K921 Melena: Secondary | ICD-10-CM | POA: Diagnosis not present

## 2021-12-28 DIAGNOSIS — M5126 Other intervertebral disc displacement, lumbar region: Secondary | ICD-10-CM | POA: Diagnosis not present

## 2021-12-28 DIAGNOSIS — I10 Essential (primary) hypertension: Secondary | ICD-10-CM | POA: Diagnosis not present

## 2021-12-28 DIAGNOSIS — N760 Acute vaginitis: Secondary | ICD-10-CM | POA: Diagnosis not present

## 2021-12-28 DIAGNOSIS — N939 Abnormal uterine and vaginal bleeding, unspecified: Secondary | ICD-10-CM | POA: Diagnosis not present

## 2021-12-28 DIAGNOSIS — R1031 Right lower quadrant pain: Secondary | ICD-10-CM | POA: Diagnosis not present

## 2021-12-28 DIAGNOSIS — R1032 Left lower quadrant pain: Secondary | ICD-10-CM | POA: Diagnosis not present

## 2021-12-28 DIAGNOSIS — K644 Residual hemorrhoidal skin tags: Secondary | ICD-10-CM | POA: Diagnosis not present

## 2022-01-08 ENCOUNTER — Ambulatory Visit: Payer: Medicare Other | Attending: Obstetrics and Gynecology

## 2022-01-08 DIAGNOSIS — R102 Pelvic and perineal pain: Secondary | ICD-10-CM | POA: Diagnosis not present

## 2022-01-08 DIAGNOSIS — M6281 Muscle weakness (generalized): Secondary | ICD-10-CM | POA: Diagnosis not present

## 2022-01-08 DIAGNOSIS — R109 Unspecified abdominal pain: Secondary | ICD-10-CM | POA: Insufficient documentation

## 2022-01-08 DIAGNOSIS — G8929 Other chronic pain: Secondary | ICD-10-CM | POA: Insufficient documentation

## 2022-01-08 DIAGNOSIS — M62838 Other muscle spasm: Secondary | ICD-10-CM | POA: Insufficient documentation

## 2022-01-08 DIAGNOSIS — R279 Unspecified lack of coordination: Secondary | ICD-10-CM | POA: Insufficient documentation

## 2022-01-08 DIAGNOSIS — R293 Abnormal posture: Secondary | ICD-10-CM | POA: Diagnosis not present

## 2022-01-08 NOTE — Therapy (Signed)
?OUTPATIENT PHYSICAL THERAPY FEMALE PELVIC EVALUATION ? ? ?Patient Name: Amy [name] ?MRN: [id number] ?DOB:12-Jul-1946, 76 y.o., female ?Today's Date: 01/08/2022 ? ? PT End of Session - 01/08/22 1228   ? ? Visit Number 1   ? Date for PT Re-Evaluation 03/19/22   ? Authorization Type Medicare   ? PT Start Time 1230   ? PT Stop Time 1315   ? PT Time Calculation (min) 45 min   ? Activity Tolerance Patient tolerated treatment well   ? Behavior During Therapy [facility] for tasks assessed/performed   ? ?  ?  ? ?  ? ? ?Past Medical History:  ?Diagnosis Date  ? Abdominal pain of unknown etiology 02/05/2016  ? Anxiety   ? Arthritis   ? ddd- RA  ? Asthma   ? "sleeping asthma"  ? Chronic back pain   ? lumbar steroid injection's  ? Complication of anesthesia   ? Hard to wake up. Pt sts is took 3 days.  ? Constipation   ? Depression   ? Dysphonia   ? intermittent "voice changes"  ? Esophageal dysmotility   ? Fibromyalgia   ? Fibromyalgia   ? GERD (gastroesophageal reflux disease)   ? Glaucoma   ? Headache   ? High cholesterol   ? ? pt states her doctor told her to continue eating vegetables & fruit  ? History of DVT (deep vein thrombosis) 1989  ? LEFT UPPER ARM  ? History of hiatal hernia   ? History of MRSA infection 2011  ? Hypertension   ? Irregular heart rate   ? "years ago"  ? Low iron   ? Lumbago   ? Mild obstructive sleep apnea   ? per study 02-07-2006 - no cpap  ? Neuropathy   ? feet  ? Pelvic pain in female   ? Peripheral vascular disease ([place])   ? "poor circulation"  ? SBO (small bowel obstruction) ([place]) 06/2016  ? Seasonal allergies   ? Short of breath on exertion   ? Urinary frequency   ? Vaginal atrophy   ? Weakness   ? both hands and feet  ? ?Past Surgical History:  ?Procedure Laterality Date  ? abdominal adhesions removed    ? ABDOMINAL HYSTERECTOMY  1978  ? BACK SURGERY    ? BUNIONECTOMY Left 2008  ? CATARACT EXTRACTION Bilateral 2017  ? COLON SURGERY    ? COLONOSCOPY    ? CYSTO WITH HYDRODISTENSION N/A  07/12/2015  ? Procedure: CYSTOSCOPY/HYDRODISTENSION;  Surgeon: Bjorn Loser, MD;  Location: [facility];  Service: Urology;  Laterality: N/A;  ? DILATION AND CURETTAGE OF UTERUS    ? ESOPHAGEAL MANOMETRY N/A 11/22/2014  ? Procedure: ESOPHAGEAL MANOMETRY (EM);  Surgeon: Winfield Cunas., MD;  [facility]: [facility];  Service: Endoscopy;  Laterality: N/A;  ? ESOPHAGOGASTRODUODENOSCOPY N/A 07/15/2013  ? Procedure: ESOPHAGOGASTRODUODENOSCOPY (EGD);  Surgeon: Winfield Cunas., MD;  Location: [facility];  Service: Endoscopy;  Laterality: N/A;  need xray  ? EXPLORATORY LAPAROTOMY    ? EYE SURGERY Bilateral   ? cataract surgery with lens implants  ? HARDWARE REMOVAL N/A 01/02/2016  ? Procedure: Exploration of Lumbar Fusion,Removal of hardware Lumbar One-Two ;Redo Posterior Lumbar Fusion Lumbar One-Two;  Surgeon: Kary Kos, MD;  Location: [place] [facility];  Service: Neurosurgery;  Laterality: N/A;  ? ingrown toenail removal    ? LAPAROSCOPIC CHOLECYSTECTOMY  01-11-2006  ? LAPAROSCOPY N/A 07/06/2016  ? Procedure: LAPAROSCOPY DIAGNOSTIC EMERGENT TO OPEN;  Surgeon: Randall Hiss  Redmond Pulling, MD;  Location: [place];  Service: General;  Laterality: N/A;  ? LAPAROTOMY N/A 07/06/2016  ? Procedure: EXPLORATORY LAPAROTOMY LYSIS OF ADHESIONS FOR 3 HOURS;  Surgeon: Greer Pickerel, MD;  Location: [place];  Service: General;  Laterality: N/A;  ? lipoma removal    ? LUMBAR FUSION  2014  ? L4 -- L5  ? MASS EXCISION Left 02/20/2013  ? Procedure: EXCISION LEFT BUTTOCK  MASS;  Surgeon: Adin Hector, MD;  Location: [facility];  Service: General;  Laterality: Left;  ? NOSE SURGERY  2007  ? REMOVAL HARDWARE L4-L5/  BILATERAL LAMINECTOMY L2 - L5 AND FUSION  06-12-2011  ? SHOULDER OPEN ROTATOR CUFF REPAIR Left 05/05/2014  ? Procedure: OPEN ACROMIONECTOMY AND OPEN REPAIR OF ROTATOR CUFF, TISSUEMEND GRAFT WITH ANCHOR ;  Surgeon: Tobi Bastos, MD;  Location: [facility];  Service: Orthopedics;  Laterality: Left;  ? SPINE SURGERY    ? x6  ? TONSILLECTOMY     ? ?Patient Active Problem List  ? Diagnosis Date Noted  ? Uncontrolled daytime somnolence 04/12/2019  ? Snoring 01/22/2019  ? Morbid obesity with body mass index of 45.0-49.9 in adult [facility]) 01/22/2019  ? OSA on CPAP NOT COMPLIANT 01/22/2019  ? Wound dehiscence 07/22/2016  ? Dyspnea 07/21/2016  ? Acute encephalopathy 07/21/2016  ? Anemia 07/21/2016  ? Pancreatitis 07/02/2016  ? SBO (small bowel obstruction) ([place]) 07/02/2016  ? Long term current use of opiate analgesic 06/26/2016  ? Long term prescription opiate use 06/26/2016  ? Opiate use 06/26/2016  ? Encounter for therapeutic drug level monitoring 06/26/2016  ? Encounter for pain management planning 06/26/2016  ? Chronic pain 06/26/2016  ? Disturbance of skin sensation 06/26/2016  ? Chronic low back pain (Location of Primary Source of Pain) (Bilateral) (R>L) 06/26/2016  ? Chronic hip pain (Location of Secondary source of pain) (Bilateral) (R>L) 06/26/2016  ? 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 Chronic shoulder pain (Location of Tertiary source of pain) (Bilateral) (R>L) 06/26/2016  ? 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) 06/26/2016  ? Chronic knee pain (Bilateral) (R>L) 06/26/2016  ? 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 Chronic neck pain (Right) 06/26/2016  ? Lumbar spondylosis 06/26/2016  ? Lumbar facet syndrome 06/26/2016  ? Lumbar facet hypertrophy 06/26/2016  ? Epidural fibrosis 06/26/2016  ? 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 Neurogenic pain 06/26/2016  ? Occipital headaches 06/26/2016  ? 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 History of lumbar fusion 06/26/2016  ? 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  ? Neuropathic pain of both legs 10/13/2015  ? Constipation 10/13/2015  ? Complete rotator cuff tear of left shoulder 05/05/2014  ? Depression 08/06/2013  ? Hypokalemia 08/06/2013  ? GERD (gastroesophageal reflux disease) 08/06/2013  ? Spinal stenosis of lumbar region 08/06/2013  ? Bladder spasm  08/06/2013  ? Morbid obesity ([place]) 02/03/2013  ? Lipoma of buttock s/p excision 02/20/2013 02/03/2013  ? DYSPNEA 01/23/2008  ? Obstructive sleep apnea 09/12/2007  ? HTN (hypertension) 09/12/2007  ? Allergic rhinitis 09/12/2007  ? SLEEPINESS 09/12/2007  ? ANGINA, HX OF 09/12/2007  ? ? ?PCP: Kelton Pillar, MD ? ?REFERRING PROVIDER: Thurnell Lose, MD ? ?REFERRING DIAG: G89.29 (ICD-10-CM) - Other chronic pain ? ?THERAPY DIAG:  ?Abnormal posture - Plan: PT plan of care cert/re-cert ? ?Muscle weakness (generalized) - Plan: PT plan of care cert/re-cert ? ?Other muscle spasm - Plan: PT plan of care cert/re-cert ? ?Unspecified lack of coordination - Plan: PT plan of care cert/re-cert ? ?  ONSET DATE: 12/08/21 ? ?SUBJECTIVE:                                                                                                                                                                                          ? ?SUBJECTIVE STATEMENT: ?Pt states that she went to doctor for problems with her stomach and she has been having blood when she uses the restroom - doctor thought blood was coming from anus but she feels like it was coming from vagina. Pain is in bil lower abdomen. She states that heavier pain in abdomen started a month ago. She reports problem with constipation. She takes metamucil 1x/day. She also reports low back pain that is much worse when she is constipated.  ?Fluid intake: Yes: 6-8 glasses of water a day   ? ?Patient confirms identification and approves PT to assess pelvic floor and treatment Yes ? ? ?PAIN:  ?Are you having pain? Yes ?NPRS scale: 5/10 ?Pain location: Bilateral and Anterior ? ?Pain type: aching, dull, and sharp ? ?Aggravating factors: constipation ?Relieving factors: having bowel movement ? ?PRECAUTIONS: None ? ?WEIGHT BEARING RESTRICTIONS No ? ?FALLS:  ?Has patient fallen in last 6 months? No ? ?LIVING ENVIRONMENT: ?Lives with: lives with their family ?Lives in: House/apartment ? ? ?OCCUPATION:  retired ? ?PLOF: Independent ? ?PATIENT GOALS decrease abdominal pain and reduce constipation ? ?PERTINENT HISTORY:  ?Bowel blockage with surgical repair and revision - 6 months in rehab ? ? ?BOWEL MOVEMENT ?Pain

## 2022-01-08 NOTE — Patient Instructions (Signed)
Squatty potty: ?When your knees are level or below the level of your hips, pelvic floor ?muscles are pressed against rectum, preventing ease of bowel movement. ?By getting knees above the level of the hips, these pelvic floor muscles ?relax, allowing easier passage of bowel movement. ?? Ways to get knees above hips: ?o Physiological scientist (7inch and 9inch versions) ?o Small stool ?o Roll of toilet paper under each foot ?o Hardback book or stack of magazines under each foot ? ?Relaxed Toileting mechanics: ?Once in this position, make sure to lean forward with forearms on thighs, ?wide knees, relaxed stomach, and breathe. ? ? ?Bowel massage: ?To assist with more regular and more comfortable bowel movements, try ?performing bowel massage nightly for 5-10 minutes. ?Place hands in the lower right side of your abdomen to start; in small ?circles, massage up, across, and down the left side of your abdomen. ?Pressure does not need to be hard, but just comfortable. ?You can use lotion or oil to make more comfortable. ? ? ?Try to take Metamucil 2-3x/day based on instructions on the bottle. ?Work on Designer, fashion/clothing intake.  ? ? ?

## 2022-01-10 DIAGNOSIS — Z20822 Contact with and (suspected) exposure to covid-19: Secondary | ICD-10-CM | POA: Diagnosis not present

## 2022-01-10 DIAGNOSIS — H5712 Ocular pain, left eye: Secondary | ICD-10-CM | POA: Diagnosis not present

## 2022-01-10 DIAGNOSIS — H5789 Other specified disorders of eye and adnexa: Secondary | ICD-10-CM | POA: Diagnosis not present

## 2022-01-20 DIAGNOSIS — Z20822 Contact with and (suspected) exposure to covid-19: Secondary | ICD-10-CM | POA: Diagnosis not present

## 2022-01-24 ENCOUNTER — Ambulatory Visit: Payer: Medicare Other

## 2022-01-25 ENCOUNTER — Ambulatory Visit: Payer: Medicare Other

## 2022-01-29 ENCOUNTER — Other Ambulatory Visit: Payer: Self-pay

## 2022-01-29 MED ORDER — FUROSEMIDE 20 MG PO TABS: 20.0000 mg | ORAL_TABLET | Freq: Every day | ORAL | 0 refills | Status: DC | PRN

## 2022-01-31 ENCOUNTER — Ambulatory Visit: Payer: Medicare Other | Attending: Obstetrics and Gynecology

## 2022-01-31 DIAGNOSIS — R279 Unspecified lack of coordination: Secondary | ICD-10-CM | POA: Diagnosis not present

## 2022-01-31 DIAGNOSIS — M62838 Other muscle spasm: Secondary | ICD-10-CM | POA: Diagnosis not present

## 2022-01-31 DIAGNOSIS — M6281 Muscle weakness (generalized): Secondary | ICD-10-CM | POA: Insufficient documentation

## 2022-01-31 DIAGNOSIS — R293 Abnormal posture: Secondary | ICD-10-CM | POA: Diagnosis not present

## 2022-01-31 NOTE — Therapy (Signed)
?OUTPATIENT PHYSICAL THERAPY TREATMENT NOTE ? ? ?Patient Name: Amy [name] ?MRN: [id number] ?DOB:[birth year], 76 y.o., female ?Today's Date: 01/31/2022 ? ?PCP: Kelton Pillar, MD ?REFERRING PROVIDER: Thurnell Lose, MD ? ?END OF SESSION:  ? PT End of Session - 01/31/22 0931   ? ? Visit Number 2   ? Date for PT Re-Evaluation 03/19/22   ? Authorization Type Medicare   ? Progress Note Due on Visit 10   ? PT Start Time 0930   ? PT Stop Time 1010   ? PT Time Calculation (min) 40 min   ? Activity Tolerance Patient tolerated treatment well   ? Behavior During Therapy [facility] for tasks assessed/performed   ? ?  ?  ? ?  ? ? ?Past Medical History:  ?Diagnosis Date  ? Abdominal pain of unknown etiology 02/05/2016  ? Anxiety   ? Arthritis   ? ddd- RA  ? Asthma   ? "sleeping asthma"  ? Chronic back pain   ? lumbar steroid injection's  ? Complication of anesthesia   ? Hard to wake up. Pt sts is took 3 days.  ? Constipation   ? Depression   ? Dysphonia   ? intermittent "voice changes"  ? Esophageal dysmotility   ? Fibromyalgia   ? Fibromyalgia   ? GERD (gastroesophageal reflux disease)   ? Glaucoma   ? Headache   ? High cholesterol   ? ? pt states her doctor told her to continue eating vegetables & fruit  ? History of DVT (deep vein thrombosis) 1989  ? LEFT UPPER ARM  ? History of hiatal hernia   ? History of MRSA infection 2011  ? Hypertension   ? Irregular heart rate   ? "years ago"  ? Low iron   ? Lumbago   ? Mild obstructive sleep apnea   ? per study 02-07-2006 - no cpap  ? Neuropathy   ? feet  ? Pelvic pain in female   ? Peripheral vascular disease ([place])   ? "poor circulation"  ? SBO (small bowel obstruction) ([place]) 06/2016  ? Seasonal allergies   ? Short of breath on exertion   ? Urinary frequency   ? Vaginal atrophy   ? Weakness   ? both hands and feet  ? ?Past Surgical History:  ?Procedure Laterality Date  ? abdominal adhesions removed    ? ABDOMINAL HYSTERECTOMY  1978  ? BACK SURGERY    ? BUNIONECTOMY Left 2008  ?  CATARACT EXTRACTION Bilateral 2017  ? COLON SURGERY    ? COLONOSCOPY    ? CYSTO WITH HYDRODISTENSION N/A 07/12/2015  ? Procedure: CYSTOSCOPY/HYDRODISTENSION;  Surgeon: Bjorn Loser, MD;  Location[facility];  Service: Urology;  Laterality: N/A;  ? DILATION AND CURETTAGE OF UTERUS    ? ESOPHAGEAL MANOMETRY N/A 11/22/2014  ? Procedure: ESOPHAGEAL MANOMETRY (EM);  Surgeon: Winfield Cunas., MD;  [facility];  Service: Endoscopy;  Laterality: N/A;  ? ESOPHAGOGASTRODUODENOSCOPY N/A 07/15/2013  ? Procedure: ESOPHAGOGASTRODUODENOSCOPY (EGD);  Surgeon: Winfield Cunas., MD;  Location: [facility];  Service: Endoscopy;  Laterality: N/A;  need xray  ? EXPLORATORY LAPAROTOMY    ? EYE SURGERY Bilateral   ? cataract surgery with lens implants  ? HARDWARE REMOVAL N/A 01/02/2016  ? Procedure: Exploration of Lumbar Fusion,Removal of hardware Lumbar One-Two ;Redo Posterior Lumbar Fusion Lumbar One-Two;  Surgeon: Kary Kos, MD;  Location: [place] [facility];  Service: Neurosurgery;  Laterality: N/A;  ? ingrown toenail removal    ?  LAPAROSCOPIC CHOLECYSTECTOMY  01-11-2006  ? LAPAROSCOPY N/A 07/06/2016  ? Procedure: LAPAROSCOPY DIAGNOSTIC EMERGENT TO OPEN;  Surgeon: Greer Pickerel, MD;  Location: [place];  Service: General;  Laterality: N/A;  ? LAPAROTOMY N/A 07/06/2016  ? Procedure: EXPLORATORY LAPAROTOMY LYSIS OF ADHESIONS FOR 3 HOURS;  Surgeon: Greer Pickerel, MD;  Location: [place];  Service: General;  Laterality: N/A;  ? lipoma removal    ? LUMBAR FUSION  2014  ? L4 -- L5  ? MASS EXCISION Left 02/20/2013  ? Procedure: EXCISION LEFT BUTTOCK  MASS;  Surgeon: Adin Hector, MD;  [facility];  Service: General;  Laterality: Left;  ? NOSE SURGERY  2007  ? REMOVAL HARDWARE L4-L5/  BILATERAL LAMINECTOMY L2 - L5 AND FUSION  06-12-2011  ? SHOULDER OPEN ROTATOR CUFF REPAIR Left 05/05/2014  ? Procedure: OPEN ACROMIONECTOMY AND OPEN REPAIR OF ROTATOR CUFF, TISSUEMEND GRAFT WITH ANCHOR ;  Surgeon: Tobi Bastos,  MD;  Location[facility];  Service: Orthopedics;  Laterality: Left;  ? SPINE SURGERY    ? x6  ? TONSILLECTOMY    ? ?Patient Active Problem List  ? Diagnosis Date Noted  ? Uncontrolled daytime somnolence 04/12/2019  ? Snoring 01/22/2019  ? Morbid obesity with body mass index of 45.0-49.9 in adult [facility]) 01/22/2019  ? OSA on CPAP NOT COMPLIANT 01/22/2019  ? Wound dehiscence 07/22/2016  ? Dyspnea 07/21/2016  ? Acute encephalopathy 07/21/2016  ? Anemia 07/21/2016  ? Pancreatitis 07/02/2016  ? SBO (small bowel obstruction) ([place]) 07/02/2016  ? Long term current use of opiate analgesic 06/26/2016  ? Long term prescription opiate use 06/26/2016  ? Opiate use 06/26/2016  ? Encounter for therapeutic drug level monitoring 06/26/2016  ? Encounter for pain management planning 06/26/2016  ? Chronic pain 06/26/2016  ? Disturbance of skin sensation 06/26/2016  ? Chronic low back pain (Location of Primary Source of Pain) (Bilateral) (R>L) 06/26/2016  ? Chronic hip pain (Location of Secondary source of pain) (Bilateral) (R>L) 06/26/2016  ? 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 Chronic shoulder pain (Location of Tertiary source of pain) (Bilateral) (R>L) 06/26/2016  ? 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) 06/26/2016  ? Chronic knee pain (Bilateral) (R>L) 06/26/2016  ? 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 Chronic neck pain (Right) 06/26/2016  ? Lumbar spondylosis 06/26/2016  ? Lumbar facet syndrome 06/26/2016  ? Lumbar facet hypertrophy 06/26/2016  ? Epidural fibrosis 06/26/2016  ? 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 Neurogenic pain 06/26/2016  ? Occipital headaches 06/26/2016  ? 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 History of lumbar fusion 06/26/2016  ? 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  ? Neuropathic pain of both legs 10/13/2015  ? Constipation 10/13/2015  ? Complete rotator cuff tear of left shoulder 05/05/2014  ? Depression 08/06/2013  ? Hypokalemia 08/06/2013  ? GERD  (gastroesophageal reflux disease) 08/06/2013  ? Spinal stenosis of lumbar region 08/06/2013  ? Bladder spasm 08/06/2013  ? Morbid obesity ([place]) 02/03/2013  ? Lipoma of buttock s/p excision 02/20/2013 02/03/2013  ? DYSPNEA 01/23/2008  ? Obstructive sleep apnea 09/12/2007  ? HTN (hypertension) 09/12/2007  ? Allergic rhinitis 09/12/2007  ? SLEEPINESS 09/12/2007  ? ANGINA, HX OF 09/12/2007  ? ? ?REFERRING DIAG: G89.29 (ICD-10-CM) - Other chronic pain ? ?THERAPY DIAG:  ?Abnormal posture ? ?Muscle weakness (generalized) ? ?Other muscle spasm ? ?Unspecified lack of coordination ? ?PERTINENT HISTORY: Bowel blockage with surgical repair and revision - 6 months in rehab ? ?PRECAUTIONS: NA ? ?SUBJECTIVE:  Patient states that she is having difficulty with sever episodes of dizziness and that is why she was unable to attend last treatment session. She feels like abdominal pain is improved and she has had only a few sharp pains in lower abdomen. She has been using squatty potty and feels like it is helpful in having good bowel movement. She feels like she is starting to go more frequently.  ? ?PAIN:  ?Are you having pain? No ? ? ?SUBJECTIVE STATEMENT 01/08/22: ?Pt states that she went to doctor for problems with her stomach and she has been having blood when she uses the restroom - doctor thought blood was coming from anus but she feels like it was coming from vagina. Pain is in bil lower abdomen. She states that heavier pain in abdomen started a month ago. She reports problem with constipation. She takes metamucil 1x/day. She also reports low back pain that is much worse when she is constipated.  ?Fluid intake: Yes: 6-8 glasses of water a day   ?  ?Patient confirms identification and approves PT to assess pelvic floor and treatment Yes ?  ? ?  ?PATIENT GOALS decrease abdominal pain and reduce constipation ?  ?  ?BOWEL MOVEMENT ?Pain with bowel movement: No ?Type of bowel movement:Frequency every 3 days  and Strain Yes ?Fully empty  rectum: No ?Leakage: No ?Pads: No ?Fiber supplement: Yes: metamucil ?  ?URINATION ?Pain with urination: No ?Fully empty bladder: No - not all the time ?Stream: Strong ?Urgency: Yes: she'll leak if she does

## 2022-02-02 IMAGING — CT CT L SPINE W/O CM
1 series · 12 of 14 positions shown, 15 images · non-contrast
Comparison: CT lumbar spine 01/19/2020

CLINICAL DATA: Evaluate spinal stenosis.  Bilateral hip pain.

EXAM:
CT LUMBAR SPINE WITHOUT CONTRAST
TECHNIQUE: Multidetector CT imaging of the lumbar spine was performed without
intravenous contrast administration. Multiplanar CT image
reconstructions were also generated.

[Series 3: l-spine 2.0 st · axial · 0.32mm/px · z∈[-296,-74]mm · 12 of 133 slices shown, 15 images]
[im 11/133  soft-tissue]
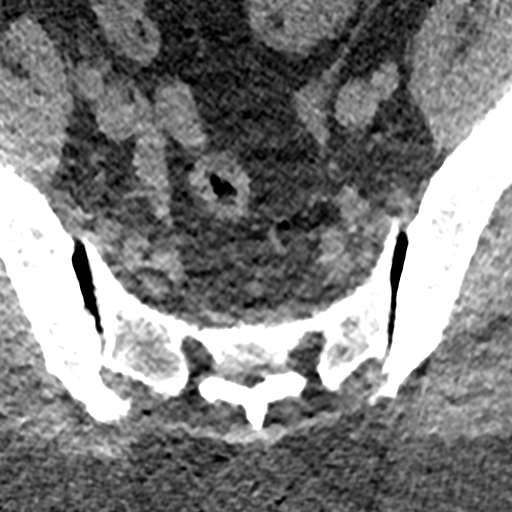
[im 11/133  bone]
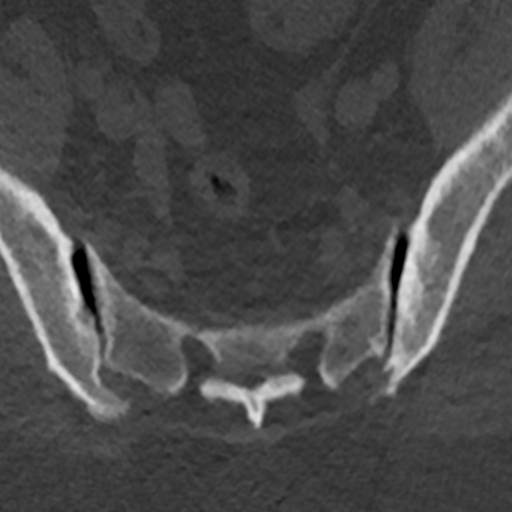
[im 21/133  bone]
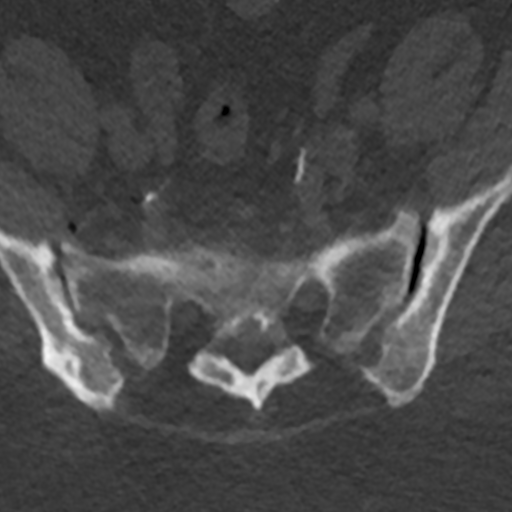
[im 31/133  bone]
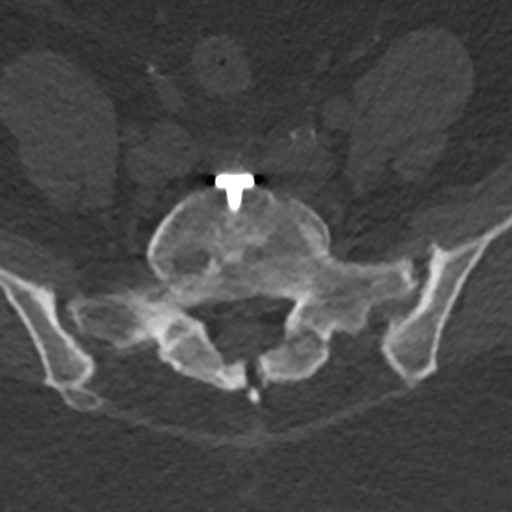
[im 41/133  bone]
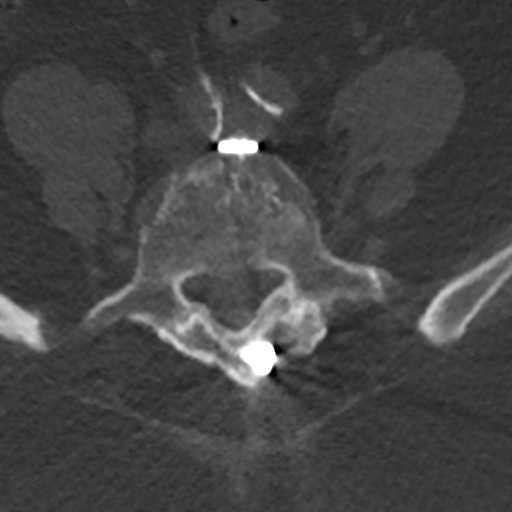
[im 51/133  soft-tissue]
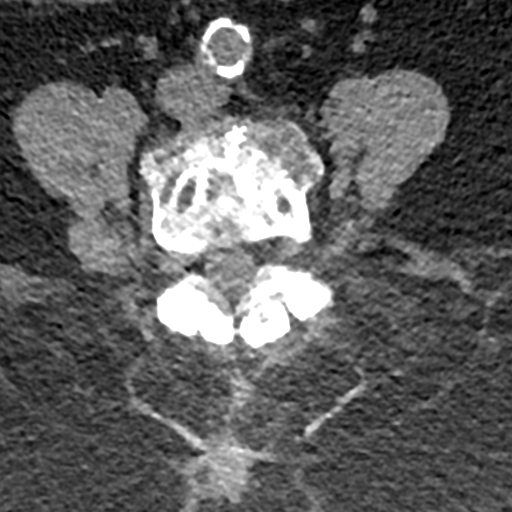
[im 51/133  bone]
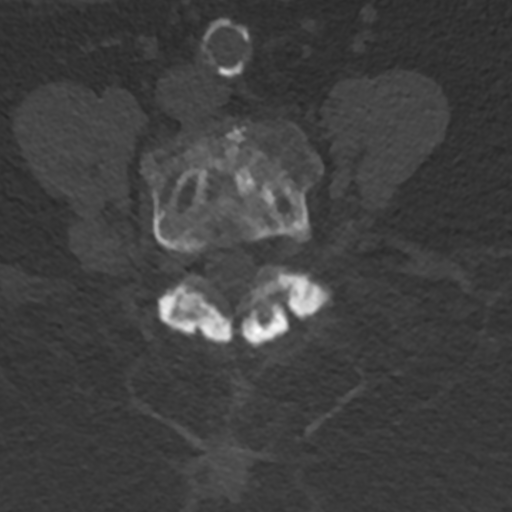
[im 61/133  bone]
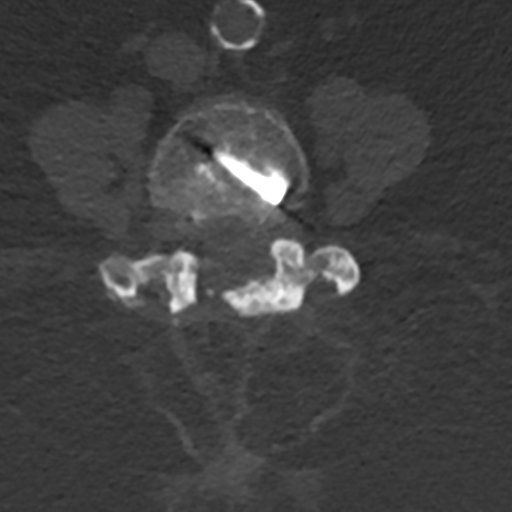
[im 72/133  bone]
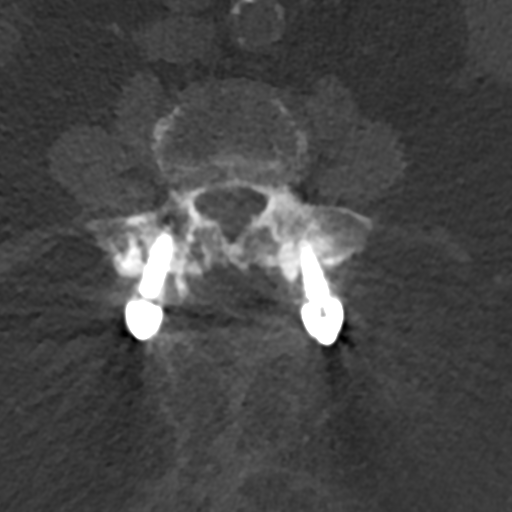
[im 82/133  bone]
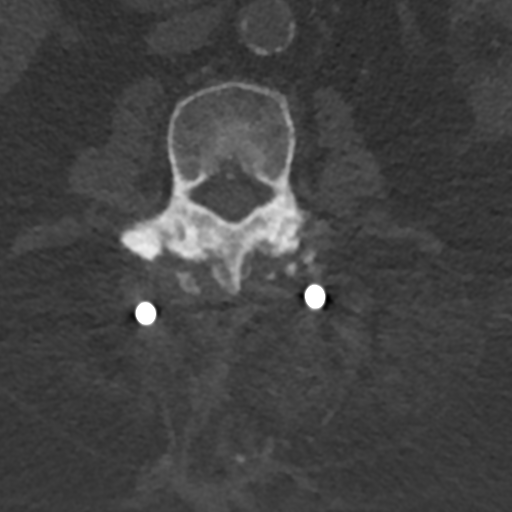
[im 92/133  soft-tissue]
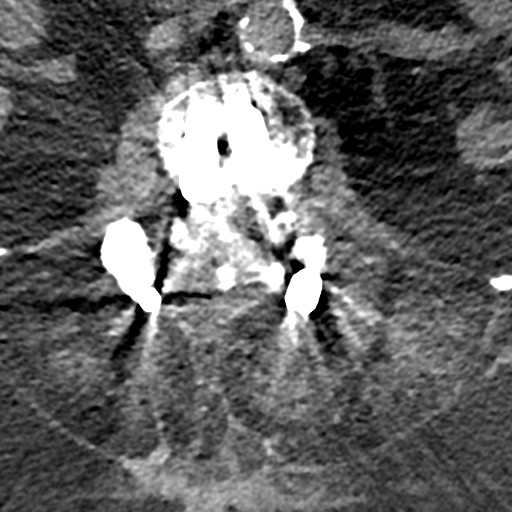
[im 92/133  bone]
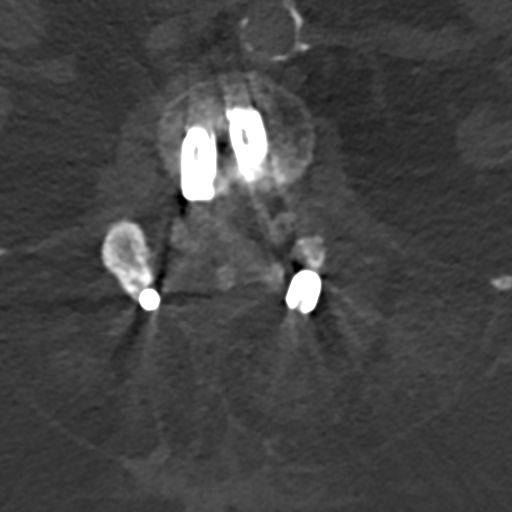
[im 102/133  bone]
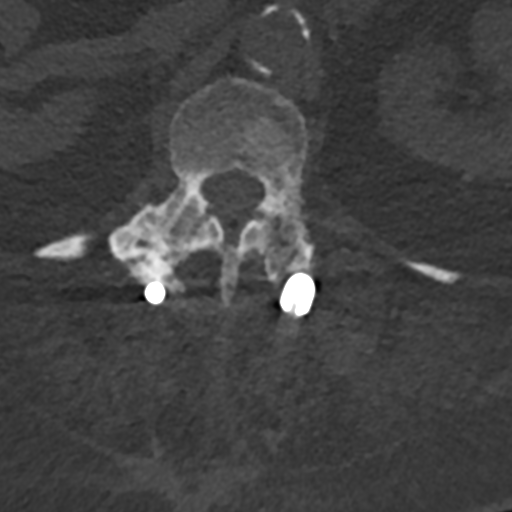
[im 112/133  bone]
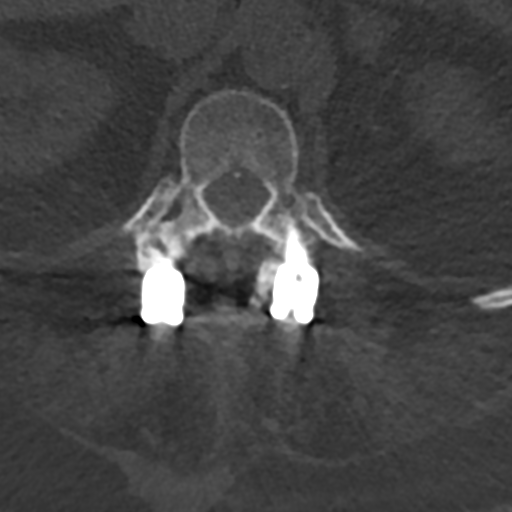
[im 122/133  bone]
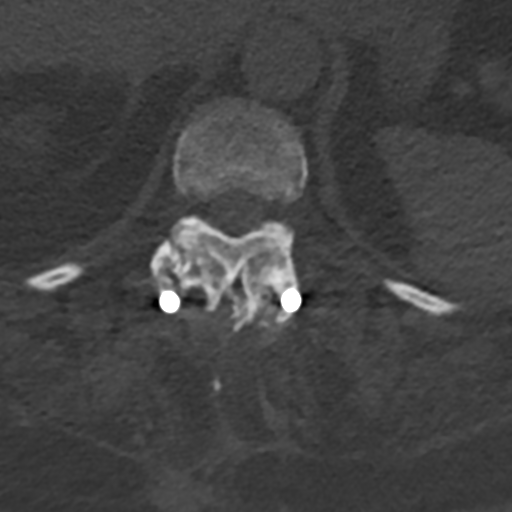

[12 of 14 positions shown; findings below may reference images not displayed]

FINDINGS: Segmentation: 5 lumbar segments.

Alignment: Normal

Vertebrae: Negative for fracture or mass. Degenerative change in
both SI joints with spurring and gas in the joint.

Surgical hardware: Pedicle screw and rod fusion in the thoracic
spine extending to the L3 level. Interbody spacers bilaterally at
L1-2. The right-sided spacer is positioned in the ventral epidural
space similar to the prior study. This is contributing to
subarticular stenosis.

Interbody spacer with solid fusion at L3-4. Solid interbody fusion
L4-5 and L5-S1. Anterior plate and screws overlying the L4-5 and
L5-S1 disc space in good position. Posterior screws across the L5-S1
facet bilaterally.

Paraspinal and other soft tissues: Negative for paraspinous mass,
adenopathy, or fluid collection. Atherosclerotic calcification aorta
and iliac arteries without aneurysm.

Disc levels: T12-L1: Negative for stenosis.

L1-2: Posterior decompression. Spinal canal decompressed.
Subarticular stenosis on the right due to spurring and posteriorly
placed spacer. No change from the prior study.

L2-3: Bilateral facet hypertrophy. Mild spinal stenosis. No interval
change

L3-4: Solid posterior bony fusion. Posterior decompression. Negative
for stenosis

L4-5: Solid interbody fusion. Solid posterior fusion. Negative for
stenosis

L5-S1: Solid interbody fusion. Solid posterior fusion. Screws across
the L5-S1 facet joint bilaterally. Negative for stenosis.
IMPRESSION: Thoracic and lumbar fusion as described above. There is no change
from the prior CT of 01/19/2020. Again note is made of posterior
displacement of the right L1 spacer, unchanged.

## 2022-02-07 ENCOUNTER — Ambulatory Visit: Payer: Medicare Other

## 2022-02-07 DIAGNOSIS — Z6841 Body Mass Index (BMI) 40.0 and over, adult: Secondary | ICD-10-CM | POA: Diagnosis not present

## 2022-02-07 DIAGNOSIS — I10 Essential (primary) hypertension: Secondary | ICD-10-CM | POA: Diagnosis not present

## 2022-02-07 DIAGNOSIS — K219 Gastro-esophageal reflux disease without esophagitis: Secondary | ICD-10-CM | POA: Diagnosis not present

## 2022-02-07 DIAGNOSIS — K5901 Slow transit constipation: Secondary | ICD-10-CM | POA: Diagnosis not present

## 2022-02-07 DIAGNOSIS — K224 Dyskinesia of esophagus: Secondary | ICD-10-CM | POA: Diagnosis not present

## 2022-02-07 NOTE — Therapy (Incomplete)
?OUTPATIENT PHYSICAL THERAPY TREATMENT NOTE ? ? ?Patient Name: Amy [name] ?MRN: [id number] ?DOB:05/19/1946, 76 y.o., female ?Today's Date: 02/07/2022 ? ?PCP: Kelton Pillar, MD ?REFERRING PROVIDER: Thurnell Lose, MD ? ?END OF SESSION:  ? ? ? ?Past Medical History:  ?Diagnosis Date  ? Abdominal pain of unknown etiology 02/05/2016  ? Anxiety   ? Arthritis   ? ddd- RA  ? Asthma   ? "sleeping asthma"  ? Chronic back pain   ? lumbar steroid injection's  ? Complication of anesthesia   ? Hard to wake up. Pt sts is took 3 days.  ? Constipation   ? Depression   ? Dysphonia   ? intermittent "voice changes"  ? Esophageal dysmotility   ? Fibromyalgia   ? Fibromyalgia   ? GERD (gastroesophageal reflux disease)   ? Glaucoma   ? Headache   ? High cholesterol   ? ? pt states her doctor told her to continue eating vegetables & fruit  ? History of DVT (deep vein thrombosis) 1989  ? LEFT UPPER ARM  ? History of hiatal hernia   ? History of MRSA infection 2011  ? Hypertension   ? Irregular heart rate   ? "years ago"  ? Low iron   ? Lumbago   ? Mild obstructive sleep apnea   ? per study 02-07-2006 - no cpap  ? Neuropathy   ? feet  ? Pelvic pain in female   ? Peripheral vascular disease ([place])   ? "poor circulation"  ? SBO (small bowel obstruction) ([place]) 06/2016  ? Seasonal allergies   ? Short of breath on exertion   ? Urinary frequency   ? Vaginal atrophy   ? Weakness   ? both hands and feet  ? ?Past Surgical History:  ?Procedure Laterality Date  ? abdominal adhesions removed    ? ABDOMINAL HYSTERECTOMY  1978  ? BACK SURGERY    ? BUNIONECTOMY Left 2008  ? CATARACT EXTRACTION Bilateral 2017  ? COLON SURGERY    ? COLONOSCOPY    ? CYSTO WITH HYDRODISTENSION N/A 07/12/2015  ? Procedure: CYSTOSCOPY/HYDRODISTENSION;  Surgeon: Bjorn Loser, MD;  Location: [facility];  Service: Urology;  Laterality: N/A;  ? DILATION AND CURETTAGE OF UTERUS    ? ESOPHAGEAL MANOMETRY N/A 11/22/2014  ? Procedure: ESOPHAGEAL  MANOMETRY (EM);  Surgeon: Winfield Cunas., MD;  Location: [facility];  Service: Endoscopy;  Laterality: N/A;  ? ESOPHAGOGASTRODUODENOSCOPY N/A 07/15/2013  ? Procedure: ESOPHAGOGASTRODUODENOSCOPY (EGD);  Surgeon: Winfield Cunas., MD;  Location: [facility];  Service: Endoscopy;  Laterality: N/A;  need xray  ? EXPLORATORY LAPAROTOMY    ? EYE SURGERY Bilateral   ? cataract surgery with lens implants  ? HARDWARE REMOVAL N/A 01/02/2016  ? Procedure: Exploration of Lumbar Fusion,Removal of hardware Lumbar One-Two ;Redo Posterior Lumbar Fusion Lumbar One-Two;  Surgeon: Kary Kos, MD;  Location: [place] [facility];  Service: Neurosurgery;  Laterality: N/A;  ? ingrown toenail removal    ? LAPAROSCOPIC CHOLECYSTECTOMY  01-11-2006  ? LAPAROSCOPY N/A 07/06/2016  ? Procedure: LAPAROSCOPY DIAGNOSTIC EMERGENT TO OPEN;  Surgeon: Greer Pickerel, MD;  Location: [place];  Service: General;  Laterality: N/A;  ? LAPAROTOMY N/A 07/06/2016  ? Procedure: EXPLORATORY LAPAROTOMY LYSIS OF ADHESIONS FOR 3 HOURS;  Surgeon: Greer Pickerel, MD;  Location: [place];  Service: General;  Laterality: N/A;  ? lipoma removal    ? LUMBAR FUSION  2014  ? L4 -- L5  ? MASS EXCISION Left 02/20/2013  ?  Procedure: EXCISION LEFT BUTTOCK  MASS;  Surgeon: Adin Hector, MD;  Location: [facility];  Service: General;  Laterality: Left;  ? NOSE SURGERY  2007  ? REMOVAL HARDWARE L4-L5/  BILATERAL LAMINECTOMY L2 - L5 AND FUSION  06-12-2011  ? SHOULDER OPEN ROTATOR CUFF REPAIR Left 05/05/2014  ? Procedure: OPEN ACROMIONECTOMY AND OPEN REPAIR OF ROTATOR CUFF, TISSUEMEND GRAFT WITH ANCHOR ;  Surgeon: Tobi Bastos, MD;  Location: [facility];  Service: Orthopedics;  Laterality: Left;  ? SPINE SURGERY    ? x6  ? TONSILLECTOMY    ? ?Patient Active Problem List  ? Diagnosis Date Noted  ? Uncontrolled daytime somnolence 04/12/2019  ? Snoring 01/22/2019  ? Morbid obesity with body mass index of 45.0-49.9 in adult [facility]) 01/22/2019  ? OSA on CPAP NOT COMPLIANT 01/22/2019  ? Wound  dehiscence 07/22/2016  ? Dyspnea 07/21/2016  ? Acute encephalopathy 07/21/2016  ? Anemia 07/21/2016  ? Pancreatitis 07/02/2016  ? SBO (small bowel obstruction) ([place]) 07/02/2016  ? Long term current use of opiate analgesic 06/26/2016  ? Long term prescription opiate use 06/26/2016  ? Opiate use 06/26/2016  ? Encounter for therapeutic drug level monitoring 06/26/2016  ? Encounter for pain management planning 06/26/2016  ? Chronic pain 06/26/2016  ? Disturbance of skin sensation 06/26/2016  ? Chronic low back pain (Location of Primary Source of Pain) (Bilateral) (R>L) 06/26/2016  ? Chronic hip pain (Location of Secondary source of pain) (Bilateral) (R>L) 06/26/2016  ? 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 Chronic shoulder pain (Location of Tertiary source of pain) (Bilateral) (R>L) 06/26/2016  ? 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) 06/26/2016  ? Chronic knee pain (Bilateral) (R>L) 06/26/2016  ? 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 Chronic neck pain (Right) 06/26/2016  ? Lumbar spondylosis 06/26/2016  ? Lumbar facet syndrome 06/26/2016  ? Lumbar facet hypertrophy 06/26/2016  ? Epidural fibrosis 06/26/2016  ? 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 Neurogenic pain 06/26/2016  ? Occipital headaches 06/26/2016  ? 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 History of lumbar fusion 06/26/2016  ? 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  ? Neuropathic pain of both legs 10/13/2015  ? Constipation 10/13/2015  ? Complete rotator cuff tear of left shoulder 05/05/2014  ? Depression 08/06/2013  ? Hypokalemia 08/06/2013  ? GERD (gastroesophageal reflux disease) 08/06/2013  ? Spinal stenosis of lumbar region 08/06/2013  ? Bladder spasm 08/06/2013  ? Morbid obesity ([place]) 02/03/2013  ? Lipoma of buttock s/p excision 02/20/2013 02/03/2013  ? DYSPNEA 01/23/2008  ? Obstructive sleep apnea 09/12/2007  ? HTN (hypertension) 09/12/2007  ? Allergic rhinitis 09/12/2007  ? SLEEPINESS 09/12/2007  ?  ANGINA, HX OF 09/12/2007  ? ? ?REFERRING DIAG: G89.29 (ICD-10-CM) - Other chronic pain ? ?THERAPY DIAG:  ?No diagnosis found. ? ?PERTINENT HISTORY: Bowel blockage with surgical repair and revision - 6 months in rehab ? ?PRECAUTIONS: NA ? ?SUBJECTIVE:  ? ?PAIN:  ?Are you having pain? No ? ? ?SUBJECTIVE STATEMENT 01/08/22: ?Pt states that she went to doctor for problems with her stomach and she has been having blood when she uses the restroom - doctor thought blood was coming from anus but she feels like it was coming from vagina. Pain is in bil lower abdomen. She states that heavier pain in abdomen started a month ago. She reports problem with constipation. She takes metamucil 1x/day. She also reports low back pain that is much worse when she is constipated.  ?Fluid intake: Yes: 6-8 glasses  of water a day   ?  ?Patient confirms identification and approves PT to assess pelvic floor and treatment Yes ?  ? ?  ?PATIENT GOALS decrease abdominal pain and reduce constipation ?  ?  ?BOWEL MOVEMENT ?Pain with bowel movement: No ?Type of bowel movement:Frequency every 3 days  and Strain Yes ?Fully empty rectum: No ?Leakage: No ?Pads: No ?Fiber supplement: Yes: metamucil ?  ?URINATION ?Pain with urination: No ?Fully empty bladder: No - not all the time ?Stream: Strong ?Urgency: Yes: she'll leak if she does not go ?Frequency: typically 2 hours ?Leakage: Urge to void - she will notice wetness in underwear, but is unsure if it is urine or sweat ?Pads: No - except when she goes out ?  ?INTERCOURSE ?Pain with intercourse:  not painful ?Ability to have vaginal penetration:  Yes: not currently ?Climax: none ?  ?  ?PREGNANCY ?Vaginal deliveries 1 ?C-section deliveries 0 ?Currently pregnant No ?  ?OBJECTIVE 01/31/22: significant abdominal scar tissue/myofascial restriction; tenderness to palpation of scar tissue itself and localized to left lower quadrant.   ? ?01/08/22  ?  ?COGNITION: ?           Overall cognitive status: Within  functional limits for tasks assessed              ?            ?SENSATION: ?           Light touch: Appears intact ?           Proprioception: Appears intact ?  ?GAIT: ?Assistive device utilized: Single point cane ?Level of as

## 2022-02-09 DIAGNOSIS — E039 Hypothyroidism, unspecified: Secondary | ICD-10-CM | POA: Diagnosis not present

## 2022-02-12 DIAGNOSIS — K2940 Chronic atrophic gastritis without bleeding: Secondary | ICD-10-CM | POA: Diagnosis not present

## 2022-02-12 DIAGNOSIS — D3A8 Other benign neuroendocrine tumors: Secondary | ICD-10-CM | POA: Diagnosis not present

## 2022-02-12 DIAGNOSIS — K31A12 Gastric intestinal metaplasia without dysplasia, involving the body (corpus): Secondary | ICD-10-CM | POA: Diagnosis not present

## 2022-02-12 DIAGNOSIS — D3A092 Benign carcinoid tumor of the stomach: Secondary | ICD-10-CM | POA: Diagnosis not present

## 2022-02-12 DIAGNOSIS — K3189 Other diseases of stomach and duodenum: Secondary | ICD-10-CM | POA: Diagnosis not present

## 2022-02-12 DIAGNOSIS — K2930 Chronic superficial gastritis without bleeding: Secondary | ICD-10-CM | POA: Diagnosis not present

## 2022-02-13 ENCOUNTER — Other Ambulatory Visit: Payer: Self-pay | Admitting: Neurosurgery

## 2022-02-13 DIAGNOSIS — M5414 Radiculopathy, thoracic region: Secondary | ICD-10-CM | POA: Diagnosis not present

## 2022-02-13 DIAGNOSIS — M546 Pain in thoracic spine: Secondary | ICD-10-CM

## 2022-02-14 ENCOUNTER — Other Ambulatory Visit: Payer: Self-pay | Admitting: Family Medicine

## 2022-02-14 ENCOUNTER — Ambulatory Visit: Payer: Medicare Other

## 2022-02-14 DIAGNOSIS — M6281 Muscle weakness (generalized): Secondary | ICD-10-CM | POA: Diagnosis not present

## 2022-02-14 DIAGNOSIS — R293 Abnormal posture: Secondary | ICD-10-CM | POA: Diagnosis not present

## 2022-02-14 DIAGNOSIS — M62838 Other muscle spasm: Secondary | ICD-10-CM

## 2022-02-14 DIAGNOSIS — R279 Unspecified lack of coordination: Secondary | ICD-10-CM | POA: Diagnosis not present

## 2022-02-14 NOTE — Therapy (Signed)
OUTPATIENT PHYSICAL THERAPY TREATMENT NOTE   Patient Name: Amy [name] MRN: [id number] DOB:01/25/1946, 76 y.o., female Today's Date: 02/14/2022  PCP: Kelton Pillar, MD REFERRING PROVIDER: Thurnell Lose, MD  END OF SESSION:   PT End of Session - 02/14/22 0930     Visit Number 3    Date for PT Re-Evaluation 03/19/22    Authorization Type Medicare    PT Start Time 0930    PT Stop Time 1010    PT Time Calculation (min) 40 min    Activity Tolerance Patient tolerated treatment well    Behavior During Therapy [facility] for tasks assessed/performed              Past Medical History:  Diagnosis Date   Abdominal pain of unknown etiology 02/05/2016   Anxiety    Arthritis    ddd- RA   Asthma    "sleeping asthma"   Chronic back pain    lumbar steroid injection's   Complication of anesthesia    Hard to wake up. Pt sts is took 3 days.   Constipation    Depression    Dysphonia    intermittent "voice changes"   Esophageal dysmotility    Fibromyalgia    Fibromyalgia    GERD (gastroesophageal reflux disease)    Glaucoma    Headache    High cholesterol    ? pt states her doctor told her to continue eating vegetables & fruit   History of DVT (deep vein thrombosis) 1989   LEFT UPPER ARM   History of hiatal hernia    History of MRSA infection 2011   Hypertension    Irregular heart rate    "years ago"   Low iron    Lumbago    Mild obstructive sleep apnea    per study 02-07-2006 - no cpap   Neuropathy    feet   Pelvic pain in female    Peripheral vascular disease ([place])    "poor circulation"   SBO (small bowel obstruction) ([place]) 06/2016   Seasonal allergies    Short of breath on exertion    Urinary frequency    Vaginal atrophy    Weakness    both hands and feet   Past Surgical History:  Procedure Laterality Date   abdominal adhesions removed     ABDOMINAL HYSTERECTOMY  1978   BACK SURGERY     BUNIONECTOMY Left 2008   CATARACT EXTRACTION Bilateral  2017   COLON SURGERY     COLONOSCOPY     CYSTO WITH HYDRODISTENSION N/A 07/12/2015   Procedure: CYSTOSCOPY/HYDRODISTENSION;  Surgeon: Bjorn Loser, MD;  Location: [facility];  Service: Urology;  Laterality: N/A;   DILATION AND CURETTAGE OF UTERUS     ESOPHAGEAL MANOMETRY N/A 11/22/2014   Procedure: ESOPHAGEAL MANOMETRY (EM);  Surgeon: Winfield Cunas., MD;  Location: [facility];  Service: Endoscopy;  Laterality: N/A;   ESOPHAGOGASTRODUODENOSCOPY N/A 07/15/2013   Procedure: ESOPHAGOGASTRODUODENOSCOPY (EGD);  Surgeon: Winfield Cunas., MD;  Location: [facility];  Service: Endoscopy;  Laterality: N/A;  need xray   EXPLORATORY LAPAROTOMY     EYE SURGERY Bilateral    cataract surgery with lens implants   HARDWARE REMOVAL N/A 01/02/2016   Procedure: Exploration of Lumbar Fusion,Removal of hardware Lumbar One-Two ;Redo Posterior Lumbar Fusion Lumbar One-Two;  Surgeon: Kary Kos, MD;  Location: [facility];  Service: Neurosurgery;  Laterality: N/A;   ingrown toenail removal     LAPAROSCOPIC CHOLECYSTECTOMY  01-11-2006  LAPAROSCOPY N/A 07/06/2016   Procedure: LAPAROSCOPY DIAGNOSTIC EMERGENT TO OPEN;  Surgeon: Greer Pickerel, MD;  Location: [place];  Service: General;  Laterality: N/A;   LAPAROTOMY N/A 07/06/2016   Procedure: EXPLORATORY LAPAROTOMY LYSIS OF ADHESIONS FOR 3 HOURS;  Surgeon: Greer Pickerel, MD;  Location: [place];  Service: General;  Laterality: N/A;   lipoma removal     LUMBAR FUSION  2014   L4 -- L5   MASS EXCISION Left 02/20/2013   Procedure: EXCISION LEFT BUTTOCK  MASS;  Surgeon: Adin Hector, MD;  Location: [facility];  Service: General;  Laterality: Left;   NOSE SURGERY  2007   REMOVAL HARDWARE L4-L5/  BILATERAL LAMINECTOMY L2 - L5 AND FUSION  06-12-2011   SHOULDER OPEN ROTATOR CUFF REPAIR Left 05/05/2014   Procedure: OPEN ACROMIONECTOMY AND OPEN REPAIR OF ROTATOR CUFF, TISSUEMEND GRAFT WITH ANCHOR ;  Surgeon: Tobi Bastos, MD;  Location: [facility];  Service:  Orthopedics;  Laterality: Left;   SPINE SURGERY     x6   TONSILLECTOMY     Patient Active Problem List   Diagnosis Date Noted   Uncontrolled daytime somnolence 04/12/2019   Snoring 01/22/2019   Morbid obesity with body mass index of 45.0-49.9 in adult [facility]) 01/22/2019   OSA on CPAP NOT COMPLIANT 01/22/2019   Wound dehiscence 07/22/2016   Dyspnea 07/21/2016   Acute encephalopathy 07/21/2016   Anemia 07/21/2016   Pancreatitis 07/02/2016   SBO (small bowel obstruction) ([place]) 07/02/2016   Long term current use of opiate analgesic 06/26/2016   Long term prescription opiate use 06/26/2016   Opiate use 06/26/2016   Encounter for therapeutic drug level monitoring 06/26/2016   Encounter for pain management planning 06/26/2016   Chronic pain 06/26/2016   Disturbance of skin sensation 06/26/2016   Chronic low back pain (Location of Primary Source of Pain) (Bilateral) (R>L) 06/26/2016   Chronic hip pain (Location of Secondary source of pain) (Bilateral) (R>L) 06/26/2016   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) 06/26/2016   Chronic shoulder pain (Location of Tertiary source of pain) (Bilateral) (R>L) 06/26/2016   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) 06/26/2016   Chronic knee pain (Bilateral) (R>L) 06/26/2016   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) 06/26/2016   Chronic neck pain (Right) 06/26/2016   Lumbar spondylosis 06/26/2016   Lumbar facet syndrome 06/26/2016   Lumbar facet hypertrophy 06/26/2016   Epidural fibrosis 06/26/2016   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   Neurogenic pain 06/26/2016   Occipital headaches 06/26/2016   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   History of lumbar fusion 06/26/2016   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   Neuropathic pain of both legs 10/13/2015   Constipation 10/13/2015   Complete rotator cuff tear of left shoulder 05/05/2014   Depression 08/06/2013   Hypokalemia 08/06/2013   GERD (gastroesophageal reflux disease)  08/06/2013   Spinal stenosis of lumbar region 08/06/2013   Bladder spasm 08/06/2013   Morbid obesity ([place]) 02/03/2013   Lipoma of buttock s/p excision 02/20/2013 02/03/2013   DYSPNEA 01/23/2008   Obstructive sleep apnea 09/12/2007   HTN (hypertension) 09/12/2007   Allergic rhinitis 09/12/2007   SLEEPINESS 09/12/2007   ANGINA, HX OF 09/12/2007    REFERRING DIAG: G89.29 (ICD-10-CM) - Other chronic pain  THERAPY DIAG:  Abnormal posture  Muscle weakness (generalized)  Other muscle spasm  Unspecified lack of coordination  PERTINENT HISTORY: Bowel blockage with surgical repair and revision - 6 months in rehab  PRECAUTIONS: NA  SUBJECTIVE: Patient states that she was very  sore and painful for several days after last treatment session. It lasted for several days. She does describe the pain as feeling different than her usual pain and more like muscular soreness. She is having much easier bowel movements.  PAIN:  Are you having pain? No   SUBJECTIVE STATEMENT 01/08/22: Pt states that she went to doctor for problems with her stomach and she has been having blood when she uses the restroom - doctor thought blood was coming from anus but she feels like it was coming from vagina. Pain is in bil lower abdomen. She states that heavier pain in abdomen started a month ago. She reports problem with constipation. She takes metamucil 1x/day. She also reports low back pain that is much worse when she is constipated.  Fluid intake: Yes: 6-8 glasses of water a day     Patient confirms identification and approves PT to assess pelvic floor and treatment Yes      PATIENT GOALS decrease abdominal pain and reduce constipation     BOWEL MOVEMENT Pain with bowel movement: No Type of bowel movement:Frequency every 3 days  and Strain Yes Fully empty rectum: No Leakage: No Pads: No Fiber supplement: Yes: metamucil   URINATION Pain with urination: No Fully empty bladder: No - not all the  time Stream: Strong Urgency: Yes: she'll leak if she does not go Frequency: typically 2 hours Leakage: Urge to void - she will notice wetness in underwear, but is unsure if it is urine or sweat Pads: No - except when she goes out   INTERCOURSE Pain with intercourse:  not painful Ability to have vaginal penetration:  Yes: not currently Climax: none     PREGNANCY Vaginal deliveries 1 C-section deliveries 0 Currently pregnant No   OBJECTIVE 01/31/22: significant abdominal scar [name]/myofascial restriction; tenderness to palpation of scar [name] itself and localized to left lower quadrant.    01/08/22    COGNITION:            Overall cognitive status: Within functional limits for tasks assessed                          SENSATION:            Light touch: Appears intact            Proprioception: Appears intact   GAIT: Assistive device utilized: Single point cane Level of assistance: Complete Independence Comments: Lt antalgic gait pattern   POSTURE:  Posterior pelvic tilt, forward head/rounded shoulders, increased thoracic kyphosis   LUMBARAROM/PROM Sig reduction in lumbar extension; all other motions reduced by 25%   LE ROM:             Sig reduction in bil hip extension   LE MMT: NA     PELVIC MMT: NA           PALPATION: NA     TONE: Not evaluated - suspect high tone   PROLAPSE: NA   TODAY'S TREATMENT 02/14/22 Manual: Soft [name] mobilization: Abdomen Scar [name] mobilization: Abdominal scar [name] release Myofascial release: Abdomen Exercises: Stretches/mobility: Bent knee fall outs 10x bil Seated trunk rotation 3 x 10 Bil seated side bend 15x bil Seated thoracic openers 8x bil   TREATMENT 01/31/22: Manual: Soft [name] mobilization: Scar [name] mobilization: Abdominal scar [name] release Myofascial release: Abdomen - focus on soft [name] surrounding scar [name] and Lt lower quadrant due to discomfort and restriction Spinal  mobilization: Internal pelvic floor techniques: Dry  needling: Neuromuscular re-education: Core retraining:  Core facilitation: Form correction: Pelvic floor contraction training: Down training: Exercises: Stretches/mobility: Seated trunk rotation 3 x 10 Bil seated side bend 15x bil Seated thoracic openers 8x bil Strengthening: Therapeutic activities: Functional strengthening activities: Self-care:    TREATMENT 01/08/22 EVAL  Self-care: Squatty potty/relaxed toileting mechanics Self-bowel massage Water intake Fiber intake       PATIENT EDUCATION:  Education details: Exercise progressions Person educated: Patient Education method: Consulting civil engineer, Demonstration, Corporate treasurer cues, Verbal cues, and Handouts Education comprehension: verbalized understanding     HOME EXERCISE PROGRAM: [id number]   ASSESSMENT:   CLINICAL IMPRESSION: Pt is seeing excellent progress with ease of bowel movements and pain. We discussed that due to extensive scar [name] in abdomen, even though she had significant soreness, manual techniques are necessary and very important. She was agreeable and tolerated well. We attempted to ease into release techniques more slowly and progress gently in order to minimize residual soreness. Good tolerance to mobility exercises with no increase in pain and appropriate challenge. She will continue to benefit from skilled PT intervention in order to improve frequency of bowel movements with less effort, decrease lower abdominal/pelvic pain, and improve QOL.      OBJECTIVE IMPAIRMENTS Abnormal gait, decreased activity tolerance, decreased coordination, decreased endurance, decreased mobility, difficulty walking, decreased strength, hypomobility, increased fascial restrictions, increased muscle spasms, impaired flexibility, impaired tone, postural dysfunction, and pain.    ACTIVITY LIMITATIONS community activity and ADLs.    PERSONAL FACTORS 1-2 comorbidities: vaginal  delivery and bowel obstruction with surgical repair and complications  are also affecting patient's functional outcome.      REHAB POTENTIAL: Good   CLINICAL DECISION MAKING: Evolving/moderate complexity   EVALUATION COMPLEXITY: Moderate     GOALS: Goals reviewed with patient? Yes   SHORT TERM GOALS: Target date: 02/05/22   Pt will be independent with HEP.    Baseline: Goal status: INITIAL   2.  Pt will be able to correctly perform diaphragmatic breathing and appropriate pressure management in order to prevent worsening vaginal wall laxity and improve pelvic floor A/ROM.    Baseline:  Goal status: INITIAL   3.  Pt will be independent with use of squatty potty, relaxed toileting mechanics, and improved bowel movement techniques in order to increase ease of bowel movements and complete evacuation.    Baseline:  Goal status: INITIAL       LONG TERM GOALS: Target date: 03/19/2022   Pt will be independent with advanced HEP.    Baseline:  Goal status: INITIAL   2.  Pt will demonstrate normal pelvic floor muscle tone and A/ROM, able to achieve 4/5 strength with contractions and 10 sec endurance, in order to provide appropriate lumbopelvic support in functional activities.    Baseline:  Goal status: INITIAL   3.  Pt will improve abdominal scar [name] restriction to help restore normal mobility to core and bowel.  Baseline:  Goal status: INITIAL   4.  Pt will report pain levels no higher than 2/10 in pelvic, abdomen, or low back. Baseline:  Goal status: INITIAL   5.  Pt will be able to go 2-3 hours in between voids without urgency or incontinence in order to improve QOL and perform all functional activities with less difficulty.    Baseline:  Goal status: INITIAL   PLAN: PT FREQUENCY: 1x/week   PT DURATION: 12 weeks   PLANNED INTERVENTIONS: Therapeutic exercises, Therapeutic activity, Neuromuscular re-education, Balance training, Gait training, Patient/Family  education, Joint mobilization, Dry  Needling, Biofeedback, and Manual therapy   PLAN FOR NEXT SESSION: Continue abdominal scar [name] release to tolerance. Progress mobility exercises.       Heather Roberts, PT, DPT05/24/2310:14 AM

## 2022-02-20 DIAGNOSIS — D3A8 Other benign neuroendocrine tumors: Secondary | ICD-10-CM | POA: Diagnosis not present

## 2022-02-20 DIAGNOSIS — K31A12 Gastric intestinal metaplasia without dysplasia, involving the body (corpus): Secondary | ICD-10-CM | POA: Diagnosis not present

## 2022-02-20 DIAGNOSIS — K2930 Chronic superficial gastritis without bleeding: Secondary | ICD-10-CM | POA: Diagnosis not present

## 2022-02-21 ENCOUNTER — Ambulatory Visit: Payer: Medicare Other

## 2022-02-21 DIAGNOSIS — R279 Unspecified lack of coordination: Secondary | ICD-10-CM | POA: Diagnosis not present

## 2022-02-21 DIAGNOSIS — M6281 Muscle weakness (generalized): Secondary | ICD-10-CM

## 2022-02-21 DIAGNOSIS — R293 Abnormal posture: Secondary | ICD-10-CM | POA: Diagnosis not present

## 2022-02-21 DIAGNOSIS — M62838 Other muscle spasm: Secondary | ICD-10-CM | POA: Diagnosis not present

## 2022-02-21 NOTE — Therapy (Signed)
OUTPATIENT PHYSICAL THERAPY TREATMENT NOTE   Patient Name: Amy [name] MRN: [id number] DOB:Aug 22, 1946, 76 y.o., female Today's Date: 02/21/2022  PCP: Kelton Pillar, MD REFERRING PROVIDER: Thurnell Lose, MD  END OF SESSION:   PT End of Session - 02/21/22 0847     Visit Number 4    Date for PT Re-Evaluation 03/19/22    Authorization Type Medicare    PT Start Time 0845    PT Stop Time 0925    PT Time Calculation (min) 40 min    Activity Tolerance Patient tolerated treatment well    Behavior During Therapy [facility] for tasks assessed/performed               Past Medical History:  Diagnosis Date   Abdominal pain of unknown etiology 02/05/2016   Anxiety    Arthritis    ddd- RA   Asthma    "sleeping asthma"   Chronic back pain    lumbar steroid injection's   Complication of anesthesia    Hard to wake up. Pt sts is took 3 days.   Constipation    Depression    Dysphonia    intermittent "voice changes"   Esophageal dysmotility    Fibromyalgia    Fibromyalgia    GERD (gastroesophageal reflux disease)    Glaucoma    Headache    High cholesterol    ? pt states her doctor told her to continue eating vegetables & fruit   History of DVT (deep vein thrombosis) 1989   LEFT UPPER ARM   History of hiatal hernia    History of MRSA infection 2011   Hypertension    Irregular heart rate    "years ago"   Low iron    Lumbago    Mild obstructive sleep apnea    per study 02-07-2006 - no cpap   Neuropathy    feet   Pelvic pain in female    Peripheral vascular disease ([place])    "poor circulation"   SBO (small bowel obstruction) ([place]) 06/2016   Seasonal allergies    Short of breath on exertion    Urinary frequency    Vaginal atrophy    Weakness    both hands and feet   Past Surgical History:  Procedure Laterality Date   abdominal adhesions removed     ABDOMINAL HYSTERECTOMY  1978   BACK SURGERY     BUNIONECTOMY Left 2008   CATARACT EXTRACTION Bilateral  2017   COLON SURGERY     COLONOSCOPY     CYSTO WITH HYDRODISTENSION N/A 07/12/2015   Procedure: CYSTOSCOPY/HYDRODISTENSION;  Surgeon: Bjorn Loser, MD;  Location: [facility];  Service: Urology;  Laterality: N/A;   DILATION AND CURETTAGE OF UTERUS     ESOPHAGEAL MANOMETRY N/A 11/22/2014   Procedure: ESOPHAGEAL MANOMETRY (EM);  Surgeon: Winfield Cunas., MD;  Location: [facility];  Service: Endoscopy;  Laterality: N/A;   ESOPHAGOGASTRODUODENOSCOPY N/A 07/15/2013   Procedure: ESOPHAGOGASTRODUODENOSCOPY (EGD);  Surgeon: Winfield Cunas., MD;  Location: [facility];  Service: Endoscopy;  Laterality: N/A;  need xray   EXPLORATORY LAPAROTOMY     EYE SURGERY Bilateral    cataract surgery with lens implants   HARDWARE REMOVAL N/A 01/02/2016   Procedure: Exploration of Lumbar Fusion,Removal of hardware Lumbar One-Two ;Redo Posterior Lumbar Fusion Lumbar One-Two;  Surgeon: Kary Kos, MD;  Location: [place] [facility];  Service: Neurosurgery;  Laterality: N/A;   ingrown toenail removal     LAPAROSCOPIC CHOLECYSTECTOMY  01-11-2006  LAPAROSCOPY N/A 07/06/2016   Procedure: LAPAROSCOPY DIAGNOSTIC EMERGENT TO OPEN;  Surgeon: Greer Pickerel, MD;  Location: [place];  Service: General;  Laterality: N/A;   LAPAROTOMY N/A 07/06/2016   Procedure: EXPLORATORY LAPAROTOMY LYSIS OF ADHESIONS FOR 3 HOURS;  Surgeon: Greer Pickerel, MD;  Location: [place];  Service: General;  Laterality: N/A;   lipoma removal     LUMBAR FUSION  2014   L4 -- L5   MASS EXCISION Left 02/20/2013   Procedure: EXCISION LEFT BUTTOCK  MASS;  Surgeon: Adin Hector, MD;  Location: [facility];  Service: General;  Laterality: Left;   NOSE SURGERY  2007   REMOVAL HARDWARE L4-L5/  BILATERAL LAMINECTOMY L2 - L5 AND FUSION  06-12-2011   SHOULDER OPEN ROTATOR CUFF REPAIR Left 05/05/2014   Procedure: OPEN ACROMIONECTOMY AND OPEN REPAIR OF ROTATOR CUFF, TISSUEMEND GRAFT WITH ANCHOR ;  Surgeon: Tobi Bastos, MD;  Location: [facility];  Service:  Orthopedics;  Laterality: Left;   SPINE SURGERY     x6   TONSILLECTOMY     Patient Active Problem List   Diagnosis Date Noted   Uncontrolled daytime somnolence 04/12/2019   Snoring 01/22/2019   Morbid obesity with body mass index of 45.0-49.9 in adult [facility]) 01/22/2019   OSA on CPAP NOT COMPLIANT 01/22/2019   Wound dehiscence 07/22/2016   Dyspnea 07/21/2016   Acute encephalopathy 07/21/2016   Anemia 07/21/2016   Pancreatitis 07/02/2016   SBO (small bowel obstruction) ([place]) 07/02/2016   Long term current use of opiate analgesic 06/26/2016   Long term prescription opiate use 06/26/2016   Opiate use 06/26/2016   Encounter for therapeutic drug level monitoring 06/26/2016   Encounter for pain management planning 06/26/2016   Chronic pain 06/26/2016   Disturbance of skin sensation 06/26/2016   Chronic low back pain (Location of Primary Source of Pain) (Bilateral) (R>L) 06/26/2016   Chronic hip pain (Location of Secondary source of pain) (Bilateral) (R>L) 06/26/2016   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) 06/26/2016   Chronic shoulder pain (Location of Tertiary source of pain) (Bilateral) (R>L) 06/26/2016   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) 06/26/2016   Chronic knee pain (Bilateral) (R>L) 06/26/2016   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) 06/26/2016   Chronic neck pain (Right) 06/26/2016   Lumbar spondylosis 06/26/2016   Lumbar facet syndrome 06/26/2016   Lumbar facet hypertrophy 06/26/2016   Epidural fibrosis 06/26/2016   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   Neurogenic pain 06/26/2016   Occipital headaches 06/26/2016   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   History of lumbar fusion 06/26/2016   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   Neuropathic pain of both legs 10/13/2015   Constipation 10/13/2015   Complete rotator cuff tear of left shoulder 05/05/2014   Depression 08/06/2013   Hypokalemia 08/06/2013   GERD (gastroesophageal reflux disease)  08/06/2013   Spinal stenosis of lumbar region 08/06/2013   Bladder spasm 08/06/2013   Morbid obesity ([place]) 02/03/2013   Lipoma of buttock s/p excision 02/20/2013 02/03/2013   DYSPNEA 01/23/2008   Obstructive sleep apnea 09/12/2007   HTN (hypertension) 09/12/2007   Allergic rhinitis 09/12/2007   SLEEPINESS 09/12/2007   ANGINA, HX OF 09/12/2007    REFERRING DIAG: G89.29 (ICD-10-CM) - Other chronic pain  THERAPY DIAG:  Abnormal posture  Other muscle spasm  Muscle weakness (generalized)  Unspecified lack of coordination  PERTINENT HISTORY: Bowel blockage with surgical repair and revision - 6 months in rehab  PRECAUTIONS: NA  SUBJECTIVE: Patient states that she did much  better after last treatment session with no significant soreness. She reports 0/10 lower abdominal pain and states that constipation continues to improve. She is still having low back pain and states that it is constant - has been for years. CT scan is scheduled for tomorrow.  PAIN:  Are you having pain? yes   SUBJECTIVE STATEMENT 01/08/22: Pt states that she went to doctor for problems with her stomach and she has been having blood when she uses the restroom - doctor thought blood was coming from anus but she feels like it was coming from vagina. Pain is in bil lower abdomen. She states that heavier pain in abdomen started a month ago. She reports problem with constipation. She takes metamucil 1x/day. She also reports low back pain that is much worse when she is constipated.  Fluid intake: Yes: 6-8 glasses of water a day     Patient confirms identification and approves PT to assess pelvic floor and treatment Yes      PATIENT GOALS decrease abdominal pain and reduce constipation     BOWEL MOVEMENT Pain with bowel movement: No Type of bowel movement:Frequency every 3 days  and Strain Yes Fully empty rectum: No Leakage: No Pads: No Fiber supplement: Yes: metamucil   URINATION Pain with urination: No Fully  empty bladder: No - not all the time Stream: Strong Urgency: Yes: she'll leak if she does not go Frequency: typically 2 hours Leakage: Urge to void - she will notice wetness in underwear, but is unsure if it is urine or sweat Pads: No - except when she goes out   INTERCOURSE Pain with intercourse:  not painful Ability to have vaginal penetration:  Yes: not currently Climax: none     PREGNANCY Vaginal deliveries 1 C-section deliveries 0 Currently pregnant No   OBJECTIVE 01/31/22: significant abdominal scar tissue/myofascial restriction; tenderness to palpation of scar tissue itself and localized to left lower quadrant.    01/08/22    COGNITION:            Overall cognitive status: Within functional limits for tasks assessed                          SENSATION:            Light touch: Appears intact            Proprioception: Appears intact   GAIT: Assistive device utilized: Single point cane Level of assistance: Complete Independence Comments: Lt antalgic gait pattern   POSTURE:  Posterior pelvic tilt, forward head/rounded shoulders, increased thoracic kyphosis   LUMBARAROM/PROM Sig reduction in lumbar extension; all other motions reduced by 25%   LE ROM:             Sig reduction in bil hip extension   LE MMT: NA     PELVIC MMT: NA           PALPATION: NA     TONE: Not evaluated - suspect high tone   PROLAPSE: NA   TODAY'S TREATMENT 02/21/22: Manual: Scar tissue mobilization: Abdominal scar tissue release Exercises: Stretches/mobility: Lower trunk rotation 2 x 10 Bent knee fall out 10x bil Strengthening: Supine march 2 x 10 Glute set/small bridge 15x Seated ball roll outs 15x   TREATMENT 02/14/22 Manual: Soft tissue mobilization: Abdomen Scar tissue mobilization: Abdominal scar tissue release Myofascial release: Abdomen Exercises: Stretches/mobility: Bent knee fall outs 10x bil Seated trunk rotation 3 x 10 Bil seated side bend 15x  bil Seated thoracic openers 8x bil   TREATMENT 01/31/22: Manual: Soft tissue mobilization: Scar tissue mobilization: Abdominal scar tissue release Myofascial release: Abdomen - focus on soft tissue surrounding scar tissue and Lt lower quadrant due to discomfort and restriction Spinal mobilization: Internal pelvic floor techniques: Dry needling: Neuromuscular re-education: Core retraining:  Core facilitation: Form correction: Pelvic floor contraction training: Down training: Exercises: Stretches/mobility: Seated trunk rotation 3 x 10 Bil seated side bend 15x bil Seated thoracic openers 8x bil Strengthening: Therapeutic activities: Functional strengthening activities: Self-care:      PATIENT EDUCATION:  Education details: Exercise progressions Person educated: Patient Education method: Explanation, Demonstration, Tactile cues, Verbal cues, and Handouts Education comprehension: verbalized understanding     HOME EXERCISE PROGRAM: [id number]   ASSESSMENT:   CLINICAL IMPRESSION: Patient doing very well demonstrated by no lower abdominal pain in the last week. Manual scar tissue release indicates large improvements in sensitivity and restriction. She was able to progress mobility and strengthening exercises with no increase in pain and appropriate challenge. She reported no increase in low back pain throughout session. She will continue to benefit from skilled PT intervention in order to improve frequency of bowel movements with less effort, decrease lower abdominal/pelvic pain, and improve QOL.      OBJECTIVE IMPAIRMENTS Abnormal gait, decreased activity tolerance, decreased coordination, decreased endurance, decreased mobility, difficulty walking, decreased strength, hypomobility, increased fascial restrictions, increased muscle spasms, impaired flexibility, impaired tone, postural dysfunction, and pain.    ACTIVITY LIMITATIONS community activity and ADLs.    PERSONAL  FACTORS 1-2 comorbidities: vaginal delivery and bowel obstruction with surgical repair and complications  are also affecting patient's functional outcome.      REHAB POTENTIAL: Good   CLINICAL DECISION MAKING: Evolving/moderate complexity   EVALUATION COMPLEXITY: Moderate     GOALS: Goals reviewed with patient? Yes   SHORT TERM GOALS: Target date: 02/05/22   Pt will be independent with HEP.    Baseline: Goal status: INITIAL   2.  Pt will be able to correctly perform diaphragmatic breathing and appropriate pressure management in order to prevent worsening vaginal wall laxity and improve pelvic floor A/ROM.    Baseline:  Goal status: INITIAL   3.  Pt will be independent with use of squatty potty, relaxed toileting mechanics, and improved bowel movement techniques in order to increase ease of bowel movements and complete evacuation.    Baseline:  Goal status: INITIAL       LONG TERM GOALS: Target date: 03/19/2022   Pt will be independent with advanced HEP.    Baseline:  Goal status: INITIAL   2.  Pt will demonstrate normal pelvic floor muscle tone and A/ROM, able to achieve 4/5 strength with contractions and 10 sec endurance, in order to provide appropriate lumbopelvic support in functional activities.    Baseline:  Goal status: INITIAL   3.  Pt will improve abdominal scar tissue restriction to help restore normal mobility to core and bowel.  Baseline:  Goal status: INITIAL   4.  Pt will report pain levels no higher than 2/10 in pelvic, abdomen, or low back. Baseline:  Goal status: INITIAL   5.  Pt will be able to go 2-3 hours in between voids without urgency or incontinence in order to improve QOL and perform all functional activities with less difficulty.    Baseline:  Goal status: INITIAL   PLAN: PT FREQUENCY: 1x/week   PT DURATION: 12 weeks   PLANNED INTERVENTIONS: Therapeutic exercises, Therapeutic activity, Neuromuscular re-education, Balance  training,  Gait training, Patient/Family education, Joint mobilization, Dry Needling, Biofeedback, and Manual therapy   PLAN FOR NEXT SESSION: Continue abdominal scar tissue release to tolerance. Progress mobility and strengthening exercises.       Heather Roberts, PT, DPT05/31/239:31 AM

## 2022-02-22 ENCOUNTER — Ambulatory Visit
Admission: RE | Admit: 2022-02-22 | Discharge: 2022-02-22 | Disposition: A | Discharge status: Home / Self Care | Payer: Medicare Other | Source: Ambulatory Visit | Attending: Neurosurgery | Admitting: Neurosurgery

## 2022-02-22 DIAGNOSIS — M546 Pain in thoracic spine: Secondary | ICD-10-CM | POA: Diagnosis not present

## 2022-02-26 ENCOUNTER — Other Ambulatory Visit: Payer: Self-pay | Admitting: Family Medicine

## 2022-02-26 DIAGNOSIS — L299 Pruritus, unspecified: Secondary | ICD-10-CM

## 2022-02-26 DIAGNOSIS — N644 Mastodynia: Secondary | ICD-10-CM

## 2022-02-28 ENCOUNTER — Ambulatory Visit: Payer: Medicare Other | Attending: Obstetrics and Gynecology

## 2022-02-28 DIAGNOSIS — M62838 Other muscle spasm: Secondary | ICD-10-CM | POA: Insufficient documentation

## 2022-02-28 DIAGNOSIS — R279 Unspecified lack of coordination: Secondary | ICD-10-CM | POA: Diagnosis not present

## 2022-02-28 DIAGNOSIS — R293 Abnormal posture: Secondary | ICD-10-CM | POA: Diagnosis not present

## 2022-02-28 DIAGNOSIS — M6281 Muscle weakness (generalized): Secondary | ICD-10-CM | POA: Diagnosis not present

## 2022-02-28 NOTE — Therapy (Signed)
OUTPATIENT PHYSICAL THERAPY TREATMENT NOTE   Patient Name: Amy [name] MRN: [id number] DOB:[birth year], 77 y.o., female Today's Date: 02/28/2022  PCP: Kelton Pillar, MD REFERRING PROVIDER: Thurnell Lose, MD  END OF SESSION:   PT End of Session - 02/28/22 0931     Visit Number 5    Date for PT Re-Evaluation 03/19/22    Authorization Type Medicare    Progress Note Due on Visit 10    PT Start Time 0930    PT Stop Time 1010    PT Time Calculation (min) 40 min    Activity Tolerance Patient tolerated treatment well    Behavior During Therapy [facility] for tasks assessed/performed               Past Medical History:  Diagnosis Date   Abdominal pain of unknown etiology 02/05/2016   Anxiety    Arthritis    ddd- RA   Asthma    "sleeping asthma"   Chronic back pain    lumbar steroid injection's   Complication of anesthesia    Hard to wake up. Pt sts is took 3 days.   Constipation    Depression    Dysphonia    intermittent "voice changes"   Esophageal dysmotility    Fibromyalgia    Fibromyalgia    GERD (gastroesophageal reflux disease)    Glaucoma    Headache    High cholesterol    ? pt states her doctor told her to continue eating vegetables & fruit   History of DVT (deep vein thrombosis) 1989   LEFT UPPER ARM   History of hiatal hernia    History of MRSA infection 2011   Hypertension    Irregular heart rate    "years ago"   Low iron    Lumbago    Mild obstructive sleep apnea    per study 02-07-2006 - no cpap   Neuropathy    feet   Pelvic pain in female    Peripheral vascular disease ([place])    "poor circulation"   SBO (small bowel obstruction) ([place]) 06/2016   Seasonal allergies    Short of breath on exertion    Urinary frequency    Vaginal atrophy    Weakness    both hands and feet   Past Surgical History:  Procedure Laterality Date   abdominal adhesions removed     ABDOMINAL HYSTERECTOMY  1978   BACK SURGERY     BUNIONECTOMY Left 2008    CATARACT EXTRACTION Bilateral 2017   COLON SURGERY     COLONOSCOPY     CYSTO WITH HYDRODISTENSION N/A 07/12/2015   Procedure: CYSTOSCOPY/HYDRODISTENSION;  Surgeon: Bjorn Loser, MD;  Location: [facility];  Service: Urology;  Laterality: N/A;   DILATION AND CURETTAGE OF UTERUS     ESOPHAGEAL MANOMETRY N/A 11/22/2014   Procedure: ESOPHAGEAL MANOMETRY (EM);  Surgeon: Winfield Cunas., MD;  Location: [facility];  Service: Endoscopy;  Laterality: N/A;   ESOPHAGOGASTRODUODENOSCOPY N/A 07/15/2013   Procedure: ESOPHAGOGASTRODUODENOSCOPY (EGD);  Surgeon: Winfield Cunas., MD;  Location: [facility];  Service: Endoscopy;  Laterality: N/A;  need xray   EXPLORATORY LAPAROTOMY     EYE SURGERY Bilateral    cataract surgery with lens implants   HARDWARE REMOVAL N/A 01/02/2016   Procedure: Exploration of Lumbar Fusion,Removal of hardware Lumbar One-Two ;Redo Posterior Lumbar Fusion Lumbar One-Two;  Surgeon: Kary Kos, MD;  Location: [place] [facility];  Service: Neurosurgery;  Laterality: N/A;   ingrown toenail removal  LAPAROSCOPIC CHOLECYSTECTOMY  01-11-2006   LAPAROSCOPY N/A 07/06/2016   Procedure: LAPAROSCOPY DIAGNOSTIC EMERGENT TO OPEN;  Surgeon: Greer Pickerel, MD;  Location: [place];  Service: General;  Laterality: N/A;   LAPAROTOMY N/A 07/06/2016   Procedure: EXPLORATORY LAPAROTOMY LYSIS OF ADHESIONS FOR 3 HOURS;  Surgeon: Greer Pickerel, MD;  Location: [place];  Service: General;  Laterality: N/A;   lipoma removal     LUMBAR FUSION  2014   L4 -- L5   MASS EXCISION Left 02/20/2013   Procedure: EXCISION LEFT BUTTOCK  MASS;  Surgeon: Adin Hector, MD;  Location: [facility];  Service: General;  Laterality: Left;   NOSE SURGERY  2007   REMOVAL HARDWARE L4-L5/  BILATERAL LAMINECTOMY L2 - L5 AND FUSION  06-12-2011   SHOULDER OPEN ROTATOR CUFF REPAIR Left 05/05/2014   Procedure: OPEN ACROMIONECTOMY AND OPEN REPAIR OF ROTATOR CUFF, TISSUEMEND GRAFT WITH ANCHOR ;  Surgeon: Tobi Bastos,  MD;  Location: [facility];  Service: Orthopedics;  Laterality: Left;   SPINE SURGERY     x6   TONSILLECTOMY     Patient Active Problem List   Diagnosis Date Noted   Uncontrolled daytime somnolence 04/12/2019   Snoring 01/22/2019   Morbid obesity with body mass index of 45.0-49.9 in adult [facility]) 01/22/2019   OSA on CPAP NOT COMPLIANT 01/22/2019   Wound dehiscence 07/22/2016   Dyspnea 07/21/2016   Acute encephalopathy 07/21/2016   Anemia 07/21/2016   Pancreatitis 07/02/2016   SBO (small bowel obstruction) ([place]) 07/02/2016   Long term current use of opiate analgesic 06/26/2016   Long term prescription opiate use 06/26/2016   Opiate use 06/26/2016   Encounter for therapeutic drug level monitoring 06/26/2016   Encounter for pain management planning 06/26/2016   Chronic pain 06/26/2016   Disturbance of skin sensation 06/26/2016   Chronic low back pain (Location of Primary Source of Pain) (Bilateral) (R>L) 06/26/2016   Chronic hip pain (Location of Secondary source of pain) (Bilateral) (R>L) 06/26/2016   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) 06/26/2016   Chronic shoulder pain (Location of Tertiary source of pain) (Bilateral) (R>L) 06/26/2016   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) 06/26/2016   Chronic knee pain (Bilateral) (R>L) 06/26/2016   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) 06/26/2016   Chronic neck pain (Right) 06/26/2016   Lumbar spondylosis 06/26/2016   Lumbar facet syndrome 06/26/2016   Lumbar facet hypertrophy 06/26/2016   Epidural fibrosis 06/26/2016   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   Neurogenic pain 06/26/2016   Occipital headaches 06/26/2016   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   History of lumbar fusion 06/26/2016   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   Neuropathic pain of both legs 10/13/2015   Constipation 10/13/2015   Complete rotator cuff tear of left shoulder 05/05/2014   Depression 08/06/2013   Hypokalemia 08/06/2013   GERD  (gastroesophageal reflux disease) 08/06/2013   Spinal stenosis of lumbar region 08/06/2013   Bladder spasm 08/06/2013   Morbid obesity ([place]) 02/03/2013   Lipoma of buttock s/p excision 02/20/2013 02/03/2013   DYSPNEA 01/23/2008   Obstructive sleep apnea 09/12/2007   HTN (hypertension) 09/12/2007   Allergic rhinitis 09/12/2007   SLEEPINESS 09/12/2007   ANGINA, HX OF 09/12/2007    REFERRING DIAG: G89.29 (ICD-10-CM) - Other chronic pain  THERAPY DIAG:  Abnormal posture  Other muscle spasm  Muscle weakness (generalized)  Unspecified lack of coordination  PERTINENT HISTORY: Bowel blockage with surgical repair and revision - 6 months in rehab  PRECAUTIONS: NA  SUBJECTIVE:  Patient states that she is having severe low back pain still. She did have CT scan, but has not seen MD since and does not have appointment until July. She had large bowel movement yesterday and had mild pain afterwards for about an hour. This was the first time in several weeks that she has had any abdominal pain. She does continue to have more regular bowel movements.   PAIN:  Are you having pain? yes   SUBJECTIVE STATEMENT 01/08/22: Pt states that she went to doctor for problems with her stomach and she has been having blood when she uses the restroom - doctor thought blood was coming from anus but she feels like it was coming from vagina. Pain is in bil lower abdomen. She states that heavier pain in abdomen started a month ago. She reports problem with constipation. She takes metamucil 1x/day. She also reports low back pain that is much worse when she is constipated.  Fluid intake: Yes: 6-8 glasses of water a day     Patient confirms identification and approves PT to assess pelvic floor and treatment Yes      PATIENT GOALS decrease abdominal pain and reduce constipation     BOWEL MOVEMENT Pain with bowel movement: No Type of bowel movement:Frequency every 3 days  and Strain Yes Fully empty rectum:  No Leakage: No Pads: No Fiber supplement: Yes: metamucil   URINATION Pain with urination: No Fully empty bladder: No - not all the time Stream: Strong Urgency: Yes: she'll leak if she does not go Frequency: typically 2 hours Leakage: Urge to void - she will notice wetness in underwear, but is unsure if it is urine or sweat Pads: No - except when she goes out   INTERCOURSE Pain with intercourse:  not painful Ability to have vaginal penetration:  Yes: not currently Climax: none     PREGNANCY Vaginal deliveries 1 C-section deliveries 0 Currently pregnant No   OBJECTIVE 01/31/22: significant abdominal scar tissue/myofascial restriction; tenderness to palpation of scar tissue itself and localized to left lower quadrant.    01/08/22    COGNITION:            Overall cognitive status: Within functional limits for tasks assessed                          SENSATION:            Light touch: Appears intact            Proprioception: Appears intact   GAIT: Assistive device utilized: Single point cane Level of assistance: Complete Independence Comments: Lt antalgic gait pattern   POSTURE:  Posterior pelvic tilt, forward head/rounded shoulders, increased thoracic kyphosis   LUMBARAROM/PROM Sig reduction in lumbar extension; all other motions reduced by 25%   LE ROM:             Sig reduction in bil hip extension   LE MMT: NA     PELVIC MMT: NA           PALPATION: NA     TONE: Not evaluated - suspect high tone   PROLAPSE: NA   TODAY'S TREATMENT 02/28/22 Manual: Scar tissue mobilization: Abdominal scar tissue release Neuromuscular re-education: Core facilitation: Seated marching 3 x 10 Exercises: Stretches/mobility: Strengthening: Seated ball squeeze, 2 x 10, 5 sec holds Seated clam shells 2 x 10, red band   TREATMENT 02/21/22: Manual: Scar tissue mobilization: Abdominal scar tissue release Exercises: Stretches/mobility: Lower trunk  rotation 2 x 10 Bent  knee fall out 10x bil Strengthening: Supine march 2 x 10 Glute set/small bridge 15x Seated ball roll outs 15x   TREATMENT 02/14/22 Manual: Soft tissue mobilization: Abdomen Scar tissue mobilization: Abdominal scar tissue release Myofascial release: Abdomen Exercises: Stretches/mobility: Bent knee fall outs 10x bil Seated trunk rotation 3 x 10 Bil seated side bend 15x bil Seated thoracic openers 8x bil       PATIENT EDUCATION:  Education details: Exercise progressions Person educated: Patient Education method: Explanation, Demonstration, Tactile cues, Verbal cues, and Handouts Education comprehension: verbalized understanding     HOME EXERCISE PROGRAM: [id number]   ASSESSMENT:   CLINICAL IMPRESSION: Believe patient continues to demonstrate progress even with exacerbation yesterday after large bowel movement. This may have been due to larger than normal bowel movement after slightly longer from previous bowel movement. We discussed benefit of progressing gentle strengthening exercises and she tolerated all seated progressions very well with no increase in pain. Scar tissue mobility continues to improve with less tenderness. She will continue to benefit from skilled PT intervention in order to improve frequency of bowel movements with less effort, decrease lower abdominal/pelvic pain, and improve QOL.      OBJECTIVE IMPAIRMENTS Abnormal gait, decreased activity tolerance, decreased coordination, decreased endurance, decreased mobility, difficulty walking, decreased strength, hypomobility, increased fascial restrictions, increased muscle spasms, impaired flexibility, impaired tone, postural dysfunction, and pain.    ACTIVITY LIMITATIONS community activity and ADLs.    PERSONAL FACTORS 1-2 comorbidities: vaginal delivery and bowel obstruction with surgical repair and complications  are also affecting patient's functional outcome.      REHAB POTENTIAL: Good   CLINICAL  DECISION MAKING: Evolving/moderate complexity   EVALUATION COMPLEXITY: Moderate     GOALS: Goals reviewed with patient? Yes   SHORT TERM GOALS: Target date: 02/05/22   Pt will be independent with HEP.    Baseline: Goal status: INITIAL   2.  Pt will be able to correctly perform diaphragmatic breathing and appropriate pressure management in order to prevent worsening vaginal wall laxity and improve pelvic floor A/ROM.    Baseline:  Goal status: INITIAL   3.  Pt will be independent with use of squatty potty, relaxed toileting mechanics, and improved bowel movement techniques in order to increase ease of bowel movements and complete evacuation.    Baseline:  Goal status: INITIAL       LONG TERM GOALS: Target date: 03/19/2022   Pt will be independent with advanced HEP.    Baseline:  Goal status: INITIAL   2.  Pt will demonstrate normal pelvic floor muscle tone and A/ROM, able to achieve 4/5 strength with contractions and 10 sec endurance, in order to provide appropriate lumbopelvic support in functional activities.    Baseline:  Goal status: INITIAL   3.  Pt will improve abdominal scar tissue restriction to help restore normal mobility to core and bowel.  Baseline:  Goal status: INITIAL   4.  Pt will report pain levels no higher than 2/10 in pelvic, abdomen, or low back. Baseline:  Goal status: INITIAL   5.  Pt will be able to go 2-3 hours in between voids without urgency or incontinence in order to improve QOL and perform all functional activities with less difficulty.    Baseline:  Goal status: INITIAL   PLAN: PT FREQUENCY: 1x/week   PT DURATION: 12 weeks   PLANNED INTERVENTIONS: Therapeutic exercises, Therapeutic activity, Neuromuscular re-education, Balance training, Gait training, Patient/Family education, Joint mobilization, Dry Needling,  Biofeedback, and Manual therapy   PLAN FOR NEXT SESSION: Continue abdominal scar tissue release to tolerance. Progress  mobility and strengthening exercises.       Heather Roberts, PT, DPT06/03/2309:12 AM

## 2022-03-02 ENCOUNTER — Ambulatory Visit (INDEPENDENT_AMBULATORY_CARE_PROVIDER_SITE_OTHER): Payer: Medicare Other | Admitting: Cardiology

## 2022-03-02 ENCOUNTER — Encounter: Payer: Self-pay | Admitting: Cardiology

## 2022-03-02 VITALS — BP 144/82 | HR 66 | Ht 65.0 in | Wt 284.0 lb

## 2022-03-02 DIAGNOSIS — I10 Essential (primary) hypertension: Secondary | ICD-10-CM | POA: Diagnosis not present

## 2022-03-02 DIAGNOSIS — I5189 Other ill-defined heart diseases: Secondary | ICD-10-CM

## 2022-03-02 NOTE — Progress Notes (Signed)
Cardiology Office Note:    Date:  03/02/2022   ID:  Amy [name], DOB 1946/07/17, MRN [id number]  PCP:  Kelton Pillar, MD   [place]  Cardiologist:  Kate Sable, MD  Advanced Practice Provider:  No care team member to display Electrophysiologist:  None       Referring MD: Kelton Pillar, MD   Chief Complaint  Patient presents with   Follow-up    6 month F/U-Patient reports intermittent chest pain. She also notes she was diagnosed with a DVT in her R lower leg a few months ago.     History of Present Illness:    Amy [name] is a 76 y.o. female with a hx of anxiety, HFpEF, hypertension, hyperlipidemia, OSA on CPAP, morbid obesity, chronic back pain, who presents for follow-up.    Patient is being seen due to diastolic dysfunction, hypertension and obesity.  States doing okay, still has chronic back pain which she is dealing with.  Leg swelling is much improved, is working on eating healthy and losing weight.  No concerns at this time.     Prior notes Lexiscan Myoview 01/2021 no evidence for ischemia, low risk study. Echo 6/37/8588 normal systolic function, EF 60 to 65%, impaired relaxation. Echo 06/2016 showed normal systolic function, EF 60 to 65%.  Impaired relaxation.  Past Medical History:  Diagnosis Date   Abdominal pain of unknown etiology 02/05/2016   Anxiety    Arthritis    ddd- RA   Asthma    "sleeping asthma"   Chronic back pain    lumbar steroid injection's   Complication of anesthesia    Hard to wake up. Pt sts is took 3 days.   Constipation    Depression    Dysphonia    intermittent "voice changes"   Esophageal dysmotility    Fibromyalgia    Fibromyalgia    GERD (gastroesophageal reflux disease)    Glaucoma    Headache    High cholesterol    ? pt states her doctor told her to continue eating vegetables & fruit   History of DVT (deep vein thrombosis) 1989   LEFT UPPER ARM   History of  hiatal hernia    History of MRSA infection 2011   Hypertension    Hypothyroidism    Irregular heart rate    "years ago"   Low iron    Lumbago    Mild obstructive sleep apnea    per study 02-07-2006 - no cpap   Neuropathy    feet   Pelvic pain in female    Peripheral vascular disease ([place])    "poor circulation"   SBO (small bowel obstruction) ([place]) 06/2016   Seasonal allergies    Short of breath on exertion    Urinary frequency    Vaginal atrophy    Weakness    both hands and feet    Past Surgical History:  Procedure Laterality Date   abdominal adhesions removed     ABDOMINAL HYSTERECTOMY  1978   BACK SURGERY     BUNIONECTOMY Left 2008   CATARACT EXTRACTION Bilateral 2017   COLON SURGERY     COLONOSCOPY     CYSTO WITH HYDRODISTENSION N/A 07/12/2015   Procedure: CYSTOSCOPY/HYDRODISTENSION;  Surgeon: Bjorn Loser, MD;  Location: [facility];  Service: Urology;  Laterality: N/A;   DILATION AND CURETTAGE OF UTERUS     ESOPHAGEAL MANOMETRY N/A 11/22/2014   Procedure: ESOPHAGEAL MANOMETRY (EM);  Surgeon: Joyice Faster  Rolla Flatten., MD;  Location: [facility];  Service: Endoscopy;  Laterality: N/A;   ESOPHAGOGASTRODUODENOSCOPY N/A 07/15/2013   Procedure: ESOPHAGOGASTRODUODENOSCOPY (EGD);  Surgeon: Winfield Cunas., MD;  Location: [facility];  Service: Endoscopy;  Laterality: N/A;  need xray   EXPLORATORY LAPAROTOMY     EYE SURGERY Bilateral    cataract surgery with lens implants   HARDWARE REMOVAL N/A 01/02/2016   Procedure: Exploration of Lumbar Fusion,Removal of hardware Lumbar One-Two ;Redo Posterior Lumbar Fusion Lumbar One-Two;  Surgeon: Kary Kos, MD;  Location: [place] [facility];  Service: Neurosurgery;  Laterality: N/A;   ingrown toenail removal     LAPAROSCOPIC CHOLECYSTECTOMY  01-11-2006   LAPAROSCOPY N/A 07/06/2016   Procedure: LAPAROSCOPY DIAGNOSTIC EMERGENT TO OPEN;  Surgeon: Greer Pickerel, MD;  Location: [place];  Service: General;  Laterality: N/A;    LAPAROTOMY N/A 07/06/2016   Procedure: EXPLORATORY LAPAROTOMY LYSIS OF ADHESIONS FOR 3 HOURS;  Surgeon: Greer Pickerel, MD;  Location: [place];  Service: General;  Laterality: N/A;   lipoma removal     LUMBAR FUSION  2014   L4 -- L5   MASS EXCISION Left 02/20/2013   Procedure: EXCISION LEFT BUTTOCK  MASS;  Surgeon: Adin Hector, MD;  Location: [facility];  Service: General;  Laterality: Left;   NOSE SURGERY  2007   REMOVAL HARDWARE L4-L5/  BILATERAL LAMINECTOMY L2 - L5 AND FUSION  06-12-2011   SHOULDER OPEN ROTATOR CUFF REPAIR Left 05/05/2014   Procedure: OPEN ACROMIONECTOMY AND OPEN REPAIR OF ROTATOR CUFF, TISSUEMEND GRAFT WITH ANCHOR ;  Surgeon: Tobi Bastos, MD;  Location: [facility];  Service: Orthopedics;  Laterality: Left;   SPINE SURGERY     x6   TONSILLECTOMY      Current Medications: Current Meds  Medication Sig   acetaminophen (TYLENOL) 500 MG tablet Take 1,000 mg by mouth every 6 (six) hours as needed for moderate pain.    atorvastatin (LIPITOR) 20 MG tablet Take 1 tablet (20 mg total) by mouth daily.   B Complex Vitamins (B COMPLEX PO) Take 1 tablet by mouth daily.   cloNIDine (CATAPRES) 0.1 MG tablet Take 1 tablet (0.1 mg total) by mouth 3 (three) times daily.   diphenhydrAMINE (BENADRYL) 25 MG tablet Take 25 mg by mouth 2 (two) times daily as needed for allergies.   estradiol (ESTRACE) 0.1 MG/GM vaginal cream Place 1 Applicatorful vaginally 2 (two) times a week.   fluticasone (FLONASE) 50 MCG/ACT nasal spray Place 1 spray into both nostrils daily.   furosemide (LASIX) 20 MG tablet Take 1 tablet (20 mg total) by mouth daily as needed for edema. PLEASE SCHEDULE OFFICE VISIT FOR FURTHER REFILLS. THANK YOU!   gabapentin (NEURONTIN) 600 MG tablet Take 600-1,200 mg by mouth 2 (two) times daily. Take '600mg'$  in the AM and 1,'200mg'$  in the PM   hydrALAZINE (APRESOLINE) 50 MG tablet Take 50 mg by mouth 3 (three) times daily.   hydrochlorothiazide (HYDRODIURIL) 12.5 MG tablet Take 12.5 mg by  mouth daily.   levothyroxine (SYNTHROID) 25 MCG tablet Take 25 mcg by mouth every morning.   Menthol, Topical Analgesic, (ICY HOT BACK EX) Apply 1 application topically 2 (two) times daily as needed (pain).   methocarbamol (ROBAXIN) 500 MG tablet Take 500 mg by mouth 4 (four) times daily as needed for muscle spasms.   metoprolol (LOPRESSOR) 50 MG tablet Take 1 tablet (50 mg total) by mouth 2 (two) times daily.   pantoprazole (PROTONIX) 40 MG tablet Take 80 mg by  mouth daily.    PARoxetine (PAXIL) 30 MG tablet Take 30 mg by mouth daily.   potassium chloride SA (K-DUR,KLOR-CON) 20 MEQ tablet Take 20 mEq by mouth daily.   traMADol (ULTRAM) 50 MG tablet Take 50 mg by mouth every 6 (six) hours as needed for moderate pain or severe pain.   vitamin E 180 MG (400 UNITS) capsule Take 400 Units by mouth daily.     Allergies:   Latex, Crestor [rosuvastatin calcium], Penicillins, Tetanus toxoids, and Tomato   Social History   Socioeconomic History   Marital status: Married    Spouse name: Not on file   Number of children: 1   Years of education: Not on file   Highest education level: Master's degree (e.g., MA, MS, MEng, MEd, MSW, MBA)  Occupational History   Not on file  Tobacco Use   Smoking status: Former    Types: Cigarettes    Quit date: 07/10/1969    Years since quitting: 52.6   Smokeless tobacco: Never  Vaping Use   Vaping Use: Never used  Substance and Sexual Activity   Alcohol use: Not Currently    Comment: rare, social   Drug use: No   Sexual activity: Not on file  Other Topics Concern   Not on file  Social History Narrative   Lives at home with husband    Right handed   Caffeine: max 2 cups coffee per day but not does not drink daily.   Social Determinants of Health   Financial Resource Strain: Not on file  Food Insecurity: Not on file  Transportation Needs: Not on file  Physical Activity: Not on file  Stress: Not on file  Social Connections: Not on file     Family  History: The patient's family history includes Aneurysm in her maternal grandmother; Cancer in her daughter, mother, and sister; Colon cancer in her father; Colon polyps in her father; Diabetes in her mother; Hypertension in her brother, brother, father, mother, and sister; Kidney disease in her brother and mother; Liver cancer in her brother and brother; Osteoarthritis in her mother; Stroke in her sister. There is no history of Migraines or Headache.  ROS:   Please see the history of present illness.     All other systems reviewed and are negative.  EKGs/Labs/Other Studies Reviewed:    The following studies were reviewed today:   EKG:  EKG is  ordered today.  The ekg ordered today demonstrates normal sinus rhythm, normal ECG  Recent Labs: 04/27/2021: ALT 16 07/06/2021: BUN 12; Creatinine, Ser 1.11; Hemoglobin 13.9; Platelets 341; Potassium 4.8; Sodium 138  Recent Lipid Panel    Component Value Date/Time   CHOL 165 07/03/2016 0627   TRIG 148 07/27/2016 0415   HDL 82 07/03/2016 0627   CHOLHDL 2.0 07/03/2016 0627   VLDL 8 07/03/2016 0627   LDLCALC 75 07/03/2016 0627     Risk Assessment/Calculations:      Physical Exam:    VS:  BP (!) 144/82 (BP Location: Right Arm, Patient Position: Sitting, Cuff Size: Large)   Pulse 66   Ht '5\' 5"'$  (1.651 m)   Wt 284 lb (128.8 kg)   SpO2 97%   BMI 47.26 kg/m     Wt Readings from Last 3 Encounters:  03/02/22 284 lb (128.8 kg)  08/03/21 290 lb (131.5 kg)  07/31/21 290 lb (131.5 kg)     GEN:  Well nourished, well developed in no acute distress HEENT: Normal NECK: No JVD; No carotid  bruits CARDIAC: RRR, no murmurs, rubs, gallops RESPIRATORY:  Clear to auscultation without rales, wheezing or rhonchi  ABDOMEN: Soft, non-tender, non-distended MUSCULOSKELETAL:  trace edema; No deformity  SKIN: Warm and dry NEUROLOGIC:  Alert and oriented x 3 PSYCHIATRIC:  Normal affect   ASSESSMENT:    1. Grade II diastolic dysfunction   2. Primary  hypertension   3. Morbid obesity ([place])    PLAN:    In order of problems listed above:  Diastolic dysfunction, likely from morbid obesity.  Hypertension also contributing.  EF preserved, trivial to no edema.  Continue Lasix 20 mg daily.   Hypertension, BP elevated today, usually controlled.  Continue current HCTZ, hydralazine, Lopressor. Morbid obesity, low-calorie diet, weight loss advised.  Follow-up in 6-12 months.   Medication Adjustments/Labs and Tests Ordered: Current medicines are reviewed at length with the patient today.  Concerns regarding medicines are outlined above.  Orders Placed This Encounter  Procedures   EKG 12-Lead    No orders of the defined types were placed in this encounter.    Patient Instructions  Medication Instructions:   Your physician recommends that you continue on your current medications as directed. Please refer to the Current Medication list given to you today.  *If you need a refill on your cardiac medications before your next appointment, please call your pharmacy*   Lab Work: None ordered If you have labs (blood work) drawn today and your tests are completely normal, you will receive your results only by: [place] (if you have MyChart) OR A paper copy in the mail If you have any lab test that is abnormal or we need to change your treatment, we will call you to review the results.   Testing/Procedures: None ordered   Follow-Up: At [facility], you and your health needs are our priority.  As part of our continuing mission to provide you with exceptional heart care, we have created designated Provider Care Teams.  These Care Teams include your primary Cardiologist (physician) and Advanced Practice Providers (APPs -  Physician Assistants and Nurse Practitioners) who all work together to provide you with the care you need, when you need it.  We recommend signing up for the patient portal called "MyChart".  Sign up information is  provided on this After Visit Summary.  MyChart is used to connect with patients for Virtual Visits (Telemedicine).  Patients are able to view lab/test results, encounter notes, upcoming appointments, etc.  Non-urgent messages can be sent to your provider as well.   To learn more about what you can do with MyChart, go to [email or web address].    Your next appointment:   6-12 month(s)  The format for your next appointment:   In Person  Provider:   You may see Kate Sable, MD or one of the following Advanced Practice Providers on your designated Care Team:   Murray Hodgkins, NP Christell Faith, PA-C Cadence Kathlen Mody, [place]    Other Instructions   Important Information About Sugar         Signed, Kate Sable, MD  03/02/2022 1:14 PM    [place]

## 2022-03-02 NOTE — Patient Instructions (Signed)
Medication Instructions:   Your physician recommends that you continue on your current medications as directed. Please refer to the Current Medication list given to you today.  *If you need a refill on your cardiac medications before your next appointment, please call your pharmacy*   Lab Work: None ordered If you have labs (blood work) drawn today and your tests are completely normal, you will receive your results only by: [place] (if you have MyChart) OR A paper copy in the mail If you have any lab test that is abnormal or we need to change your treatment, we will call you to review the results.   Testing/Procedures: None ordered   Follow-Up: At [facility], you and your health needs are our priority.  As part of our continuing mission to provide you with exceptional heart care, we have created designated Provider Care Teams.  These Care Teams include your primary Cardiologist (physician) and Advanced Practice Providers (APPs -  Physician Assistants and Nurse Practitioners) who all work together to provide you with the care you need, when you need it.  We recommend signing up for the patient portal called "MyChart".  Sign up information is provided on this After Visit Summary.  MyChart is used to connect with patients for Virtual Visits (Telemedicine).  Patients are able to view lab/test results, encounter notes, upcoming appointments, etc.  Non-urgent messages can be sent to your provider as well.   To learn more about what you can do with MyChart, go to [email or web address].    Your next appointment:   6-12 month(s)  The format for your next appointment:   In Person  Provider:   You may see Kate Sable, MD or one of the following Advanced Practice Providers on your designated Care Team:   Murray Hodgkins, NP Christell Faith, PA-C Cadence Kathlen Mody, [place]    Other Instructions   Important Information About Sugar

## 2022-03-07 ENCOUNTER — Ambulatory Visit: Payer: Medicare Other

## 2022-03-07 DIAGNOSIS — R293 Abnormal posture: Secondary | ICD-10-CM | POA: Diagnosis not present

## 2022-03-07 DIAGNOSIS — R279 Unspecified lack of coordination: Secondary | ICD-10-CM | POA: Diagnosis not present

## 2022-03-07 DIAGNOSIS — M6281 Muscle weakness (generalized): Secondary | ICD-10-CM

## 2022-03-07 DIAGNOSIS — M62838 Other muscle spasm: Secondary | ICD-10-CM | POA: Diagnosis not present

## 2022-03-07 NOTE — Therapy (Signed)
OUTPATIENT PHYSICAL THERAPY TREATMENT NOTE   Patient Name: Amy [name] MRN: [id number] DOB:1946/03/23, 76 y.o., female Today's Date: 03/07/2022  PCP: Kelton Pillar, MD REFERRING PROVIDER: Thurnell Lose, MD  END OF SESSION:   PT End of Session - 03/07/22 0930     Visit Number 6    Date for PT Re-Evaluation 03/19/22    Authorization Type Medicare    Progress Note Due on Visit 10    PT Start Time 0930    PT Stop Time 1010    PT Time Calculation (min) 40 min    Activity Tolerance Patient tolerated treatment well    Behavior During Therapy [facility] for tasks assessed/performed               Past Medical History:  Diagnosis Date   Abdominal pain of unknown etiology 02/05/2016   Anxiety    Arthritis    ddd- RA   Asthma    "sleeping asthma"   Chronic back pain    lumbar steroid injection's   Complication of anesthesia    Hard to wake up. Pt sts is took 3 days.   Constipation    Depression    Dysphonia    intermittent "voice changes"   Esophageal dysmotility    Fibromyalgia    Fibromyalgia    GERD (gastroesophageal reflux disease)    Glaucoma    Headache    High cholesterol    ? pt states her doctor told her to continue eating vegetables & fruit   History of DVT (deep vein thrombosis) 1989   LEFT UPPER ARM   History of hiatal hernia    History of MRSA infection 2011   Hypertension    Hypothyroidism    Irregular heart rate    "years ago"   Low iron    Lumbago    Mild obstructive sleep apnea    per study 02-07-2006 - no cpap   Neuropathy    feet   Pelvic pain in female    Peripheral vascular disease ([place])    "poor circulation"   SBO (small bowel obstruction) ([place]) 06/2016   Seasonal allergies    Short of breath on exertion    Urinary frequency    Vaginal atrophy    Weakness    both hands and feet   Past Surgical History:  Procedure Laterality Date   abdominal adhesions removed     ABDOMINAL HYSTERECTOMY  1978   BACK SURGERY      BUNIONECTOMY Left 2008   CATARACT EXTRACTION Bilateral 2017   COLON SURGERY     COLONOSCOPY     CYSTO WITH HYDRODISTENSION N/A 07/12/2015   Procedure: CYSTOSCOPY/HYDRODISTENSION;  Surgeon: Bjorn Loser, MD;  Location: [facility];  Service: Urology;  Laterality: N/A;   DILATION AND CURETTAGE OF UTERUS     ESOPHAGEAL MANOMETRY N/A 11/22/2014   Procedure: ESOPHAGEAL MANOMETRY (EM);  Surgeon: Winfield Cunas., MD;  Location: [facility];  Service: Endoscopy;  Laterality: N/A;   ESOPHAGOGASTRODUODENOSCOPY N/A 07/15/2013   Procedure: ESOPHAGOGASTRODUODENOSCOPY (EGD);  Surgeon: Winfield Cunas., MD;  Location: [facility];  Service: Endoscopy;  Laterality: N/A;  need xray   EXPLORATORY LAPAROTOMY     EYE SURGERY Bilateral    cataract surgery with lens implants   HARDWARE REMOVAL N/A 01/02/2016   Procedure: Exploration of Lumbar Fusion,Removal of hardware Lumbar One-Two ;Redo Posterior Lumbar Fusion Lumbar One-Two;  Surgeon: Kary Kos, MD;  Location: [place] [facility];  Service: Neurosurgery;  Laterality: N/A;  ingrown toenail removal     LAPAROSCOPIC CHOLECYSTECTOMY  01-11-2006   LAPAROSCOPY N/A 07/06/2016   Procedure: LAPAROSCOPY DIAGNOSTIC EMERGENT TO OPEN;  Surgeon: Greer Pickerel, MD;  Location: [place];  Service: General;  Laterality: N/A;   LAPAROTOMY N/A 07/06/2016   Procedure: EXPLORATORY LAPAROTOMY LYSIS OF ADHESIONS FOR 3 HOURS;  Surgeon: Greer Pickerel, MD;  Location: [place];  Service: General;  Laterality: N/A;   lipoma removal     LUMBAR FUSION  2014   L4 -- L5   MASS EXCISION Left 02/20/2013   Procedure: EXCISION LEFT BUTTOCK  MASS;  Surgeon: Adin Hector, MD;  Location: [facility];  Service: General;  Laterality: Left;   NOSE SURGERY  2007   REMOVAL HARDWARE L4-L5/  BILATERAL LAMINECTOMY L2 - L5 AND FUSION  06-12-2011   SHOULDER OPEN ROTATOR CUFF REPAIR Left 05/05/2014   Procedure: OPEN ACROMIONECTOMY AND OPEN REPAIR OF ROTATOR CUFF, TISSUEMEND GRAFT WITH ANCHOR ;   Surgeon: Tobi Bastos, MD;  Location: [facility];  Service: Orthopedics;  Laterality: Left;   SPINE SURGERY     x6   TONSILLECTOMY     Patient Active Problem List   Diagnosis Date Noted   Uncontrolled daytime somnolence 04/12/2019   Snoring 01/22/2019   Morbid obesity with body mass index of 45.0-49.9 in adult [facility]) 01/22/2019   OSA on CPAP NOT COMPLIANT 01/22/2019   Wound dehiscence 07/22/2016   Dyspnea 07/21/2016   Acute encephalopathy 07/21/2016   Anemia 07/21/2016   Pancreatitis 07/02/2016   SBO (small bowel obstruction) ([place]) 07/02/2016   Long term current use of opiate analgesic 06/26/2016   Long term prescription opiate use 06/26/2016   Opiate use 06/26/2016   Encounter for therapeutic drug level monitoring 06/26/2016   Encounter for pain management planning 06/26/2016   Chronic pain 06/26/2016   Disturbance of skin sensation 06/26/2016   Chronic low back pain (Location of Primary Source of Pain) (Bilateral) (R>L) 06/26/2016   Chronic hip pain (Location of Secondary source of pain) (Bilateral) (R>L) 06/26/2016   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) 06/26/2016   Chronic shoulder pain (Location of Tertiary source of pain) (Bilateral) (R>L) 06/26/2016   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) 06/26/2016   Chronic knee pain (Bilateral) (R>L) 06/26/2016   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) 06/26/2016   Chronic neck pain (Right) 06/26/2016   Lumbar spondylosis 06/26/2016   Lumbar facet syndrome 06/26/2016   Lumbar facet hypertrophy 06/26/2016   Epidural fibrosis 06/26/2016   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   Neurogenic pain 06/26/2016   Occipital headaches 06/26/2016   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   History of lumbar fusion 06/26/2016   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   Neuropathic pain of both legs 10/13/2015   Constipation 10/13/2015   Complete rotator cuff tear of left shoulder 05/05/2014   Depression 08/06/2013    Hypokalemia 08/06/2013   GERD (gastroesophageal reflux disease) 08/06/2013   Spinal stenosis of lumbar region 08/06/2013   Bladder spasm 08/06/2013   Morbid obesity ([place]) 02/03/2013   Lipoma of buttock s/p excision 02/20/2013 02/03/2013   DYSPNEA 01/23/2008   Obstructive sleep apnea 09/12/2007   HTN (hypertension) 09/12/2007   Allergic rhinitis 09/12/2007   SLEEPINESS 09/12/2007   ANGINA, HX OF 09/12/2007    REFERRING DIAG: G89.29 (ICD-10-CM) - Other chronic pain  THERAPY DIAG:  Abnormal posture  Other muscle spasm  Muscle weakness (generalized)  Unspecified lack of coordination  PERTINENT HISTORY: Bowel blockage with surgical repair and revision - 6 months  in rehab  PRECAUTIONS: NA  SUBJECTIVE: Patient states that she is still having some back pain after trying to vacuum yesterday. She continues to report 0/10 abdominal pain. She feels like frequency of bowel movements has slowed down a little bit - she is typically having one good size bowel movement every day to every other day.   PAIN:  Are you having pain? Yes, 6-7/10, low back, medication and sitting helpful, household chores are aggravating   SUBJECTIVE STATEMENT 01/08/22: Pt states that she went to doctor for problems with her stomach and she has been having blood when she uses the restroom - doctor thought blood was coming from anus but she feels like it was coming from vagina. Pain is in bil lower abdomen. She states that heavier pain in abdomen started a month ago. She reports problem with constipation. She takes metamucil 1x/day. She also reports low back pain that is much worse when she is constipated.  Fluid intake: Yes: 6-8 glasses of water a day     Patient confirms identification and approves PT to assess pelvic floor and treatment Yes      PATIENT GOALS decrease abdominal pain and reduce constipation     BOWEL MOVEMENT Pain with bowel movement: No Type of bowel movement:Frequency every 3 days  and  Strain Yes Fully empty rectum: No Leakage: No Pads: No Fiber supplement: Yes: metamucil   URINATION Pain with urination: No Fully empty bladder: No - not all the time Stream: Strong Urgency: Yes: she'll leak if she does not go Frequency: typically 2 hours Leakage: Urge to void - she will notice wetness in underwear, but is unsure if it is urine or sweat Pads: No - except when she goes out   INTERCOURSE Pain with intercourse:  not painful Ability to have vaginal penetration:  Yes: not currently Climax: none     PREGNANCY Vaginal deliveries 1 C-section deliveries 0 Currently pregnant No   OBJECTIVE 03/07/22: still abdominal scar tissue restriction present  01/31/22: significant abdominal scar tissue/myofascial restriction; tenderness to palpation of scar tissue itself and localized to left lower quadrant.    01/08/22    COGNITION:            Overall cognitive status: Within functional limits for tasks assessed                          SENSATION:            Light touch: Appears intact            Proprioception: Appears intact   GAIT: Assistive device utilized: Single point cane Level of assistance: Complete Independence Comments: Lt antalgic gait pattern   POSTURE:  Posterior pelvic tilt, forward head/rounded shoulders, increased thoracic kyphosis   LUMBARAROM/PROM Sig reduction in lumbar extension; all other motions reduced by 25%   LE ROM:             Sig reduction in bil hip extension   LE MMT: NA     PELVIC MMT: NA           PALPATION: NA     TONE: Not evaluated - suspect high tone   PROLAPSE: NA   TODAY'S TREATMENT 03/07/22 Manual: Scar tissue mobilization: Abdominal scar tissue mobilization Neuromuscular re-education: Core retraining:  Core facilitation: Seated march 2 x 10 Exercises: Stretches/mobility: Lower trunk rotation 3 x 10 Bent knee fall outs 10x bil Strengthening: Seated hip adduction 2 x 10   TREATMENT  02/28/22 Manual: Scar  tissue mobilization: Abdominal scar tissue release Neuromuscular re-education: Core facilitation: Seated marching 3 x 10 Exercises: Stretches/mobility: Strengthening: Seated ball squeeze, 2 x 10, 5 sec holds Seated clam shells 2 x 10, red band   TREATMENT 02/21/22: Manual: Scar tissue mobilization: Abdominal scar tissue release Exercises: Stretches/mobility: Lower trunk rotation 2 x 10 Bent knee fall out 10x bil Strengthening: Supine march 2 x 10 Glute set/small bridge 15x Seated ball roll outs 15x   TREATMENT 02/14/22 Manual: Soft tissue mobilization: Abdomen Scar tissue mobilization: Abdominal scar tissue release Myofascial release: Abdomen Exercises: Stretches/mobility: Bent knee fall outs 10x bil Seated trunk rotation 3 x 10 Bil seated side bend 15x bil Seated thoracic openers 8x bil       PATIENT EDUCATION:  Education details: Exercise progressions Person educated: Patient Education method: Explanation, Demonstration, Tactile cues, Verbal cues, and Handouts Education comprehension: verbalized understanding     HOME EXERCISE PROGRAM: [id number]   ASSESSMENT:   CLINICAL IMPRESSION: Patient does continue to demonstrate progress with bowel movements and no abdominal pain. She is having back pain that most likely is coming from different source, but exercises for abdominal pain will most likely help with. At end of POC, we will plan to recommend asking for referral to return to PT for low back. She did very well with all exercises and progression of seated band pull down. Most exercises do cause her some amount of discomfort, but she returns to baseline pain at end of session. She will continue to benefit from skilled PT intervention in order to improve frequency of bowel movements with less effort, decrease lower abdominal/pelvic pain, and improve QOL.      OBJECTIVE IMPAIRMENTS Abnormal gait, decreased activity tolerance, decreased coordination, decreased  endurance, decreased mobility, difficulty walking, decreased strength, hypomobility, increased fascial restrictions, increased muscle spasms, impaired flexibility, impaired tone, postural dysfunction, and pain.    ACTIVITY LIMITATIONS community activity and ADLs.    PERSONAL FACTORS 1-2 comorbidities: vaginal delivery and bowel obstruction with surgical repair and complications  are also affecting patient's functional outcome.      REHAB POTENTIAL: Good   CLINICAL DECISION MAKING: Evolving/moderate complexity   EVALUATION COMPLEXITY: Moderate     GOALS: Goals reviewed with patient? Yes   SHORT TERM GOALS: Target date: 02/05/22 - updated 03/07/22   Pt will be independent with HEP.    Baseline: Goal status: MET   2.  Pt will be able to correctly perform diaphragmatic breathing and appropriate pressure management in order to prevent worsening vaginal wall laxity and improve pelvic floor A/ROM.    Baseline:  Goal status: MET   3.  Pt will be independent with use of squatty potty, relaxed toileting mechanics, and improved bowel movement techniques in order to increase ease of bowel movements and complete evacuation.    Baseline:  Goal status: MET       LONG TERM GOALS: Target date: 03/19/2022 - updated 03/07/22   Pt will be independent with advanced HEP.    Baseline:  Goal status: IN PROGRESS   2.  Pt will demonstrate normal pelvic floor muscle tone and A/ROM, able to achieve 4/5 strength with contractions and 10 sec endurance, in order to provide appropriate lumbopelvic support in functional activities.    Baseline: Patient is performing exercises that will be helpful to improve pelvic floor strength, but internal vaginal exam not likely to formally assess strength since she is making good improvements without. Goal status: DISCHARGE GOAL   3.  Pt  will improve abdominal scar tissue restriction to help restore normal mobility to core and bowel.  Baseline: Significant  improvements, but still notable restriction present Goal status: IN PROGRESS   4.  Pt will report pain levels no higher than 2/10 in pelvis, abdomen, or low back. Baseline:  Goal status: IN PROGRESS   5.  Pt will be able to go 2-3 hours in between voids without urgency or incontinence in order to improve QOL and perform all functional activities with less difficulty.    Baseline: Patient going 2-3 hours between trips to the bathroom to urinate, sometimes longer Goal status: MET   PLAN: PT FREQUENCY: 1x/week   PT DURATION: 12 weeks   PLANNED INTERVENTIONS: Therapeutic exercises, Therapeutic activity, Neuromuscular re-education, Balance training, Gait training, Patient/Family education, Joint mobilization, Dry Needling, Biofeedback, and Manual therapy   PLAN FOR NEXT SESSION: Continue abdominal scar tissue release to tolerance. Progress mobility and strengthening exercises. Consider standing exercises.       Heather Roberts, PT, DPT06/14/2310:13 AM

## 2022-03-14 ENCOUNTER — Ambulatory Visit: Payer: Medicare Other

## 2022-03-14 DIAGNOSIS — M62838 Other muscle spasm: Secondary | ICD-10-CM

## 2022-03-14 DIAGNOSIS — R293 Abnormal posture: Secondary | ICD-10-CM

## 2022-03-14 DIAGNOSIS — R279 Unspecified lack of coordination: Secondary | ICD-10-CM

## 2022-03-14 DIAGNOSIS — M6281 Muscle weakness (generalized): Secondary | ICD-10-CM | POA: Diagnosis not present

## 2022-03-14 NOTE — Therapy (Signed)
OUTPATIENT PHYSICAL THERAPY TREATMENT NOTE   Patient Name: Amy [name] MRN: [id number] DOB:Jul 27, 1946, 76 y.o., female Today's Date: 03/14/2022  PCP: Kelton Pillar, MD REFERRING PROVIDER: Thurnell Lose, MD  END OF SESSION:   PT End of Session - 03/14/22 0923     Visit Number 7    Date for PT Re-Evaluation 03/19/22    Authorization Type Medicare    Progress Note Due on Visit 10    PT Start Time 0930    PT Stop Time 1010    PT Time Calculation (min) 40 min    Activity Tolerance Patient tolerated treatment well    Behavior During Therapy [facility] for tasks assessed/performed                Past Medical History:  Diagnosis Date   Abdominal pain of unknown etiology 02/05/2016   Anxiety    Arthritis    ddd- RA   Asthma    "sleeping asthma"   Chronic back pain    lumbar steroid injection's   Complication of anesthesia    Hard to wake up. Pt sts is took 3 days.   Constipation    Depression    Dysphonia    intermittent "voice changes"   Esophageal dysmotility    Fibromyalgia    Fibromyalgia    GERD (gastroesophageal reflux disease)    Glaucoma    Headache    High cholesterol    ? pt states her doctor told her to continue eating vegetables & fruit   History of DVT (deep vein thrombosis) 1989   LEFT UPPER ARM   History of hiatal hernia    History of MRSA infection 2011   Hypertension    Hypothyroidism    Irregular heart rate    "years ago"   Low iron    Lumbago    Mild obstructive sleep apnea    per study 02-07-2006 - no cpap   Neuropathy    feet   Pelvic pain in female    Peripheral vascular disease ([place])    "poor circulation"   SBO (small bowel obstruction) ([place]) 06/2016   Seasonal allergies    Short of breath on exertion    Urinary frequency    Vaginal atrophy    Weakness    both hands and feet   Past Surgical History:  Procedure Laterality Date   abdominal adhesions removed     ABDOMINAL HYSTERECTOMY  1978   BACK SURGERY      BUNIONECTOMY Left 2008   CATARACT EXTRACTION Bilateral 2017   COLON SURGERY     COLONOSCOPY     CYSTO WITH HYDRODISTENSION N/A 07/12/2015   Procedure: CYSTOSCOPY/HYDRODISTENSION;  Surgeon: Bjorn Loser, MD;  Location: [facility];  Service: Urology;  Laterality: N/A;   DILATION AND CURETTAGE OF UTERUS     ESOPHAGEAL MANOMETRY N/A 11/22/2014   Procedure: ESOPHAGEAL MANOMETRY (EM);  Surgeon: Winfield Cunas., MD;  Location: [facility];  Service: Endoscopy;  Laterality: N/A;   ESOPHAGOGASTRODUODENOSCOPY N/A 07/15/2013   Procedure: ESOPHAGOGASTRODUODENOSCOPY (EGD);  Surgeon: Winfield Cunas., MD;  Location: [facility];  Service: Endoscopy;  Laterality: N/A;  need xray   EXPLORATORY LAPAROTOMY     EYE SURGERY Bilateral    cataract surgery with lens implants   HARDWARE REMOVAL N/A 01/02/2016   Procedure: Exploration of Lumbar Fusion,Removal of hardware Lumbar One-Two ;Redo Posterior Lumbar Fusion Lumbar One-Two;  Surgeon: Kary Kos, MD;  Location: [place] [facility];  Service: Neurosurgery;  Laterality: N/A;  ingrown toenail removal     LAPAROSCOPIC CHOLECYSTECTOMY  01-11-2006   LAPAROSCOPY N/A 07/06/2016   Procedure: LAPAROSCOPY DIAGNOSTIC EMERGENT TO OPEN;  Surgeon: Greer Pickerel, MD;  Location: [place];  Service: General;  Laterality: N/A;   LAPAROTOMY N/A 07/06/2016   Procedure: EXPLORATORY LAPAROTOMY LYSIS OF ADHESIONS FOR 3 HOURS;  Surgeon: Greer Pickerel, MD;  Location: [place];  Service: General;  Laterality: N/A;   lipoma removal     LUMBAR FUSION  2014   L4 -- L5   MASS EXCISION Left 02/20/2013   Procedure: EXCISION LEFT BUTTOCK  MASS;  Surgeon: Adin Hector, MD;  Location: [facility];  Service: General;  Laterality: Left;   NOSE SURGERY  2007   REMOVAL HARDWARE L4-L5/  BILATERAL LAMINECTOMY L2 - L5 AND FUSION  06-12-2011   SHOULDER OPEN ROTATOR CUFF REPAIR Left 05/05/2014   Procedure: OPEN ACROMIONECTOMY AND OPEN REPAIR OF ROTATOR CUFF, TISSUEMEND GRAFT WITH ANCHOR ;   Surgeon: Tobi Bastos, MD;  Location: [facility];  Service: Orthopedics;  Laterality: Left;   SPINE SURGERY     x6   TONSILLECTOMY     Patient Active Problem List   Diagnosis Date Noted   Uncontrolled daytime somnolence 04/12/2019   Snoring 01/22/2019   Morbid obesity with body mass index of 45.0-49.9 in adult [facility]) 01/22/2019   OSA on CPAP NOT COMPLIANT 01/22/2019   Wound dehiscence 07/22/2016   Dyspnea 07/21/2016   Acute encephalopathy 07/21/2016   Anemia 07/21/2016   Pancreatitis 07/02/2016   SBO (small bowel obstruction) ([place]) 07/02/2016   Long term current use of opiate analgesic 06/26/2016   Long term prescription opiate use 06/26/2016   Opiate use 06/26/2016   Encounter for therapeutic drug level monitoring 06/26/2016   Encounter for pain management planning 06/26/2016   Chronic pain 06/26/2016   Disturbance of skin sensation 06/26/2016   Chronic low back pain (Location of Primary Source of Pain) (Bilateral) (R>L) 06/26/2016   Chronic hip pain (Location of Secondary source of pain) (Bilateral) (R>L) 06/26/2016   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) 06/26/2016   Chronic shoulder pain (Location of Tertiary source of pain) (Bilateral) (R>L) 06/26/2016   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) 06/26/2016   Chronic knee pain (Bilateral) (R>L) 06/26/2016   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) 06/26/2016   Chronic neck pain (Right) 06/26/2016   Lumbar spondylosis 06/26/2016   Lumbar facet syndrome 06/26/2016   Lumbar facet hypertrophy 06/26/2016   Epidural fibrosis 06/26/2016   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   Neurogenic pain 06/26/2016   Occipital headaches 06/26/2016   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   History of lumbar fusion 06/26/2016   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   Neuropathic pain of both legs 10/13/2015   Constipation 10/13/2015   Complete rotator cuff tear of left shoulder 05/05/2014   Depression 08/06/2013    Hypokalemia 08/06/2013   GERD (gastroesophageal reflux disease) 08/06/2013   Spinal stenosis of lumbar region 08/06/2013   Bladder spasm 08/06/2013   Morbid obesity ([place]) 02/03/2013   Lipoma of buttock s/p excision 02/20/2013 02/03/2013   DYSPNEA 01/23/2008   Obstructive sleep apnea 09/12/2007   HTN (hypertension) 09/12/2007   Allergic rhinitis 09/12/2007   SLEEPINESS 09/12/2007   ANGINA, HX OF 09/12/2007    REFERRING DIAG: G89.29 (ICD-10-CM) - Other chronic pain  THERAPY DIAG:  Abnormal posture  Other muscle spasm  Muscle weakness (generalized)  Unspecified lack of coordination  PERTINENT HISTORY: Bowel blockage with surgical repair and revision - 6 months  in rehab  PRECAUTIONS: NA  SUBJECTIVE: Patient states that she is still noticing improved bowel movements. She will typically have a solid bowel movement followed by diarrhea.   PAIN:  Are you having pain? Yes, 6-7/10, low back, medication and sitting helpful, household chores are aggravating   SUBJECTIVE STATEMENT 01/08/22: Pt states that she went to doctor for problems with her stomach and she has been having blood when she uses the restroom - doctor thought blood was coming from anus but she feels like it was coming from vagina. Pain is in bil lower abdomen. She states that heavier pain in abdomen started a month ago. She reports problem with constipation. She takes metamucil 1x/day. She also reports low back pain that is much worse when she is constipated.  Fluid intake: Yes: 6-8 glasses of water a day     Patient confirms identification and approves PT to assess pelvic floor and treatment Yes      PATIENT GOALS decrease abdominal pain and reduce constipation     BOWEL MOVEMENT Pain with bowel movement: No Type of bowel movement:Frequency every 3 days  and Strain Yes Fully empty rectum: No Leakage: No Pads: No Fiber supplement: Yes: metamucil   URINATION Pain with urination: No Fully empty bladder: No -  not all the time Stream: Strong Urgency: Yes: she'll leak if she does not go Frequency: typically 2 hours Leakage: Urge to void - she will notice wetness in underwear, but is unsure if it is urine or sweat Pads: No - except when she goes out   INTERCOURSE Pain with intercourse:  not painful Ability to have vaginal penetration:  Yes: not currently Climax: none     PREGNANCY Vaginal deliveries 1 C-section deliveries 0 Currently pregnant No   OBJECTIVE 03/07/22: still abdominal scar tissue restriction present  01/31/22: significant abdominal scar tissue/myofascial restriction; tenderness to palpation of scar tissue itself and localized to left lower quadrant.    01/08/22    COGNITION:            Overall cognitive status: Within functional limits for tasks assessed                          SENSATION:            Light touch: Appears intact            Proprioception: Appears intact   GAIT: Assistive device utilized: Single point cane Level of assistance: Complete Independence Comments: Lt antalgic gait pattern   POSTURE:  Posterior pelvic tilt, forward head/rounded shoulders, increased thoracic kyphosis   LUMBARAROM/PROM Sig reduction in lumbar extension; all other motions reduced by 25%   LE ROM:             Sig reduction in bil hip extension   LE MMT: NA     PELVIC MMT: NA           PALPATION: NA     TONE: Not evaluated - suspect high tone   PROLAPSE: NA   TODAY'S TREATMENT 03/14/22 Manual: Scar tissue mobilization: Abdominal scar tissue mobilization Exercises: Stretches/mobility: Strengthening: Rows 2 x 10, red band Bil UE extension 2 x 10, red band Self-care: Increasing fiber - fiber supplement to help decrease diarrhea Review of self-bowel massage   TREATMENT 03/07/22 Manual: Scar tissue mobilization: Abdominal scar tissue mobilization Neuromuscular re-education: Core retraining:  Core facilitation: Seated march 2 x  10 Exercises: Stretches/mobility: Lower trunk rotation 3 x 10 Bent knee  fall outs 10x bil Strengthening: Seated hip adduction 2 x 10   TREATMENT 02/28/22 Manual: Scar tissue mobilization: Abdominal scar tissue release Neuromuscular re-education: Core facilitation: Seated marching 3 x 10 Exercises: Stretches/mobility: Strengthening: Seated ball squeeze, 2 x 10, 5 sec holds Seated clam shells 2 x 10, red band      PATIENT EDUCATION:  Education details: Exercise progressions Person educated: Patient Education method: Explanation, Demonstration, Tactile cues, Verbal cues, and Handouts Education comprehension: verbalized understanding     HOME EXERCISE PROGRAM: [email or web address]   ASSESSMENT:   CLINICAL IMPRESSION: Patient overall doing very well with no report of abdominal pain/discomfort. She is having regular bowel movements, but due to some loose stool increased soluble fiber was recommended. Scar tissue restriction in abdomen continues to improve and patient is no longer reporting any discomfort with mobilization techniques. She did well with exercise progressions to help improve core strengthening/mobility/circulation with no increase in pain. She will continue to benefit from skilled PT intervention in order to improve frequency of bowel movements with less effort, decrease lower abdominal/pelvic pain, and improve QOL.      OBJECTIVE IMPAIRMENTS Abnormal gait, decreased activity tolerance, decreased coordination, decreased endurance, decreased mobility, difficulty walking, decreased strength, hypomobility, increased fascial restrictions, increased muscle spasms, impaired flexibility, impaired tone, postural dysfunction, and pain.    ACTIVITY LIMITATIONS community activity and ADLs.    PERSONAL FACTORS 1-2 comorbidities: vaginal delivery and bowel obstruction with surgical repair and complications  are also affecting patient's functional outcome.      REHAB POTENTIAL: Good    CLINICAL DECISION MAKING: Evolving/moderate complexity   EVALUATION COMPLEXITY: Moderate     GOALS: Goals reviewed with patient? Yes   SHORT TERM GOALS: Target date: 02/05/22 - updated 03/07/22   Pt will be independent with HEP.    Baseline: Goal status: MET   2.  Pt will be able to correctly perform diaphragmatic breathing and appropriate pressure management in order to prevent worsening vaginal wall laxity and improve pelvic floor A/ROM.    Baseline:  Goal status: MET   3.  Pt will be independent with use of squatty potty, relaxed toileting mechanics, and improved bowel movement techniques in order to increase ease of bowel movements and complete evacuation.    Baseline:  Goal status: MET       LONG TERM GOALS: Target date: 03/19/2022 - updated 03/07/22   Pt will be independent with advanced HEP.    Baseline:  Goal status: IN PROGRESS   2.  Pt will demonstrate normal pelvic floor muscle tone and A/ROM, able to achieve 4/5 strength with contractions and 10 sec endurance, in order to provide appropriate lumbopelvic support in functional activities.    Baseline: Patient is performing exercises that will be helpful to improve pelvic floor strength, but internal vaginal exam not likely to formally assess strength since she is making good improvements without. Goal status: DISCHARGE GOAL   3.  Pt will improve abdominal scar tissue restriction to help restore normal mobility to core and bowel.  Baseline: Significant improvements, but still notable restriction present Goal status: IN PROGRESS   4.  Pt will report pain levels no higher than 2/10 in pelvis, abdomen, or low back. Baseline:  Goal status: IN PROGRESS   5.  Pt will be able to go 2-3 hours in between voids without urgency or incontinence in order to improve QOL and perform all functional activities with less difficulty.    Baseline: Patient going 2-3 hours between trips to the bathroom  to urinate, sometimes  longer Goal status: MET   PLAN: PT FREQUENCY: 1x/week   PT DURATION: 12 weeks   PLANNED INTERVENTIONS: Therapeutic exercises, Therapeutic activity, Neuromuscular re-education, Balance training, Gait training, Patient/Family education, Joint mobilization, Dry Needling, Biofeedback, and Manual therapy   PLAN FOR NEXT SESSION: Continue abdominal scar tissue release to tolerance. Progress mobility and strengthening exercises. D/C.       Heather Roberts, PT, DPT06/21/2310:12 AM

## 2022-03-19 ENCOUNTER — Ambulatory Visit: Payer: Medicare Other

## 2022-03-19 ENCOUNTER — Ambulatory Visit
Admission: RE | Admit: 2022-03-19 | Discharge: 2022-03-19 | Disposition: A | Discharge status: Home / Self Care | Payer: Medicare Other | Source: Ambulatory Visit | Attending: Family Medicine | Admitting: Family Medicine

## 2022-03-19 DIAGNOSIS — N644 Mastodynia: Secondary | ICD-10-CM | POA: Diagnosis not present

## 2022-03-19 DIAGNOSIS — L299 Pruritus, unspecified: Secondary | ICD-10-CM

## 2022-03-20 DIAGNOSIS — M546 Pain in thoracic spine: Secondary | ICD-10-CM | POA: Diagnosis not present

## 2022-03-20 DIAGNOSIS — Z6841 Body Mass Index (BMI) 40.0 and over, adult: Secondary | ICD-10-CM | POA: Diagnosis not present

## 2022-03-20 DIAGNOSIS — M961 Postlaminectomy syndrome, not elsewhere classified: Secondary | ICD-10-CM | POA: Diagnosis not present

## 2022-03-21 ENCOUNTER — Ambulatory Visit: Payer: Medicare Other

## 2022-03-21 DIAGNOSIS — R293 Abnormal posture: Secondary | ICD-10-CM | POA: Diagnosis not present

## 2022-03-21 DIAGNOSIS — M6281 Muscle weakness (generalized): Secondary | ICD-10-CM

## 2022-03-21 DIAGNOSIS — R279 Unspecified lack of coordination: Secondary | ICD-10-CM | POA: Diagnosis not present

## 2022-03-21 DIAGNOSIS — M62838 Other muscle spasm: Secondary | ICD-10-CM | POA: Diagnosis not present

## 2022-03-21 NOTE — Therapy (Signed)
OUTPATIENT PHYSICAL THERAPY TREATMENT NOTE   Patient Name: Amy [name] MRN: [id number] DOB:January 10, 1946, 76 y.o., female Today's Date: 03/21/2022  PCP: Kelton Pillar, MD REFERRING PROVIDER: Thurnell Lose, MD  END OF SESSION:   PT End of Session - 03/21/22 0918     Visit Number 8    Date for PT Re-Evaluation 03/19/22    Authorization Type Medicare    PT Start Time 0930    PT Stop Time 1010    PT Time Calculation (min) 40 min    Activity Tolerance Patient tolerated treatment well    Behavior During Therapy [facility] for tasks assessed/performed                 Past Medical History:  Diagnosis Date   Abdominal pain of unknown etiology 02/05/2016   Anxiety    Arthritis    ddd- RA   Asthma    "sleeping asthma"   Chronic back pain    lumbar steroid injection's   Complication of anesthesia    Hard to wake up. Pt sts is took 3 days.   Constipation    Depression    Dysphonia    intermittent "voice changes"   Esophageal dysmotility    Fibromyalgia    Fibromyalgia    GERD (gastroesophageal reflux disease)    Glaucoma    Headache    High cholesterol    ? pt states her doctor told her to continue eating vegetables & fruit   History of DVT (deep vein thrombosis) 1989   LEFT UPPER ARM   History of hiatal hernia    History of MRSA infection 2011   Hypertension    Hypothyroidism    Irregular heart rate    "years ago"   Low iron    Lumbago    Mild obstructive sleep apnea    per study 02-07-2006 - no cpap   Neuropathy    feet   Pelvic pain in female    Peripheral vascular disease ([place])    "poor circulation"   SBO (small bowel obstruction) ([place]) 06/2016   Seasonal allergies    Short of breath on exertion    Urinary frequency    Vaginal atrophy    Weakness    both hands and feet   Past Surgical History:  Procedure Laterality Date   abdominal adhesions removed     ABDOMINAL HYSTERECTOMY  1978   BACK SURGERY     BUNIONECTOMY Left 2008    CATARACT EXTRACTION Bilateral 2017   COLON SURGERY     COLONOSCOPY     CYSTO WITH HYDRODISTENSION N/A 07/12/2015   Procedure: CYSTOSCOPY/HYDRODISTENSION;  Surgeon: Bjorn Loser, MD;  Location[facility];  Service: Urology;  Laterality: N/A;   DILATION AND CURETTAGE OF UTERUS     ESOPHAGEAL MANOMETRY N/A 11/22/2014   Procedure: ESOPHAGEAL MANOMETRY (EM);  Surgeon: Winfield Cunas., MD;  Location: [facility];  Service: Endoscopy;  Laterality: N/A;   ESOPHAGOGASTRODUODENOSCOPY N/A 07/15/2013   Procedure: ESOPHAGOGASTRODUODENOSCOPY (EGD);  Surgeon: Winfield Cunas., MD;  Location: [facility];  Service: Endoscopy;  Laterality: N/A;  need xray   EXPLORATORY LAPAROTOMY     EYE SURGERY Bilateral    cataract surgery with lens implants   HARDWARE REMOVAL N/A 01/02/2016   Procedure: Exploration of Lumbar Fusion,Removal of hardware Lumbar One-Two ;Redo Posterior Lumbar Fusion Lumbar One-Two;  Surgeon: Kary Kos, MD;  Location: [place] [facility];  Service: Neurosurgery;  Laterality: N/A;   ingrown toenail removal  LAPAROSCOPIC CHOLECYSTECTOMY  01-11-2006   LAPAROSCOPY N/A 07/06/2016   Procedure: LAPAROSCOPY DIAGNOSTIC EMERGENT TO OPEN;  Surgeon: Greer Pickerel, MD;  Location: [place];  Service: General;  Laterality: N/A;   LAPAROTOMY N/A 07/06/2016   Procedure: EXPLORATORY LAPAROTOMY LYSIS OF ADHESIONS FOR 3 HOURS;  Surgeon: Greer Pickerel, MD;  Location: [place];  Service: General;  Laterality: N/A;   lipoma removal     LUMBAR FUSION  2014   L4 -- L5   MASS EXCISION Left 02/20/2013   Procedure: EXCISION LEFT BUTTOCK  MASS;  Surgeon: Adin Hector, MD;  Location: [facility];  Service: General;  Laterality: Left;   NOSE SURGERY  2007   REMOVAL HARDWARE L4-L5/  BILATERAL LAMINECTOMY L2 - L5 AND FUSION  06-12-2011   SHOULDER OPEN ROTATOR CUFF REPAIR Left 05/05/2014   Procedure: OPEN ACROMIONECTOMY AND OPEN REPAIR OF ROTATOR CUFF, TISSUEMEND GRAFT WITH ANCHOR ;  Surgeon: Tobi Bastos,  MD;  Location: [facility];  Service: Orthopedics;  Laterality: Left;   SPINE SURGERY     x6   TONSILLECTOMY     Patient Active Problem List   Diagnosis Date Noted   Uncontrolled daytime somnolence 04/12/2019   Snoring 01/22/2019   Morbid obesity with body mass index of 45.0-49.9 in adult [facility]) 01/22/2019   OSA on CPAP NOT COMPLIANT 01/22/2019   Wound dehiscence 07/22/2016   Dyspnea 07/21/2016   Acute encephalopathy 07/21/2016   Anemia 07/21/2016   Pancreatitis 07/02/2016   SBO (small bowel obstruction) ([place]) 07/02/2016   Long term current use of opiate analgesic 06/26/2016   Long term prescription opiate use 06/26/2016   Opiate use 06/26/2016   Encounter for therapeutic drug level monitoring 06/26/2016   Encounter for pain management planning 06/26/2016   Chronic pain 06/26/2016   Disturbance of skin sensation 06/26/2016   Chronic low back pain (Location of Primary Source of Pain) (Bilateral) (R>L) 06/26/2016   Chronic hip pain (Location of Secondary source of pain) (Bilateral) (R>L) 06/26/2016   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) 06/26/2016   Chronic shoulder pain (Location of Tertiary source of pain) (Bilateral) (R>L) 06/26/2016   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) 06/26/2016   Chronic knee pain (Bilateral) (R>L) 06/26/2016   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) 06/26/2016   Chronic neck pain (Right) 06/26/2016   Lumbar spondylosis 06/26/2016   Lumbar facet syndrome 06/26/2016   Lumbar facet hypertrophy 06/26/2016   Epidural fibrosis 06/26/2016   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   Neurogenic pain 06/26/2016   Occipital headaches 06/26/2016   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   History of lumbar fusion 06/26/2016   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   Neuropathic pain of both legs 10/13/2015   Constipation 10/13/2015   Complete rotator cuff tear of left shoulder 05/05/2014   Depression 08/06/2013   Hypokalemia 08/06/2013   GERD  (gastroesophageal reflux disease) 08/06/2013   Spinal stenosis of lumbar region 08/06/2013   Bladder spasm 08/06/2013   Morbid obesity ([place]) 02/03/2013   Lipoma of buttock s/p excision 02/20/2013 02/03/2013   DYSPNEA 01/23/2008   Obstructive sleep apnea 09/12/2007   HTN (hypertension) 09/12/2007   Allergic rhinitis 09/12/2007   SLEEPINESS 09/12/2007   ANGINA, HX OF 09/12/2007    REFERRING DIAG: G89.29 (ICD-10-CM) - Other chronic pain  THERAPY DIAG:  Abnormal posture  Other muscle spasm  Muscle weakness (generalized)  Unspecified lack of coordination  PERTINENT HISTORY: Bowel blockage with surgical repair and revision - 6 months in rehab  PRECAUTIONS: NA  SUBJECTIVE:  Patient states that her back is really bothering her today - she saw MD yesterday and they both agree that surgery is not an option right now. She has appointment with new pain clinic for management of symptoms. She has been working on dietary changes that will continue to help bowel movements; she is also working on hydrating better. She continues to report no abdominal pain. She states that she is moving more and doing better. She does not have bowel movement daily/consistency, but more.   PAIN:  Are you having pain? Yes, 6-7/10, low back, medication and sitting helpful, household chores are aggravating   SUBJECTIVE STATEMENT 01/08/22: Pt states that she went to doctor for problems with her stomach and she has been having blood when she uses the restroom - doctor thought blood was coming from anus but she feels like it was coming from vagina. Pain is in bil lower abdomen. She states that heavier pain in abdomen started a month ago. She reports problem with constipation. She takes metamucil 1x/day. She also reports low back pain that is much worse when she is constipated.  Fluid intake: Yes: 6-8 glasses of water a day     Patient confirms identification and approves PT to assess pelvic floor and treatment Yes       PATIENT GOALS decrease abdominal pain and reduce constipation     BOWEL MOVEMENT Pain with bowel movement: No Type of bowel movement:Frequency every 3 days  and Strain Yes Fully empty rectum: No Leakage: No Pads: No Fiber supplement: Yes: metamucil   URINATION Pain with urination: No Fully empty bladder: No - not all the time Stream: Strong Urgency: Yes: she'll leak if she does not go Frequency: typically 2 hours Leakage: Urge to void - she will notice wetness in underwear, but is unsure if it is urine or sweat Pads: No - except when she goes out   INTERCOURSE Pain with intercourse:  not painful Ability to have vaginal penetration:  Yes: not currently Climax: none     PREGNANCY Vaginal deliveries 1 C-section deliveries 0 Currently pregnant No   OBJECTIVE 03/21/22: scar tissue restriction still present, significant improvements in tenderness and mobility  03/07/22: still abdominal scar tissue restriction present  01/31/22: significant abdominal scar tissue/myofascial restriction; tenderness to palpation of scar tissue itself and localized to left lower quadrant.    01/08/22    COGNITION:            Overall cognitive status: Within functional limits for tasks assessed                          SENSATION:            Light touch: Appears intact            Proprioception: Appears intact   GAIT: Assistive device utilized: Single point cane Level of assistance: Complete Independence Comments: Lt antalgic gait pattern   POSTURE:  Posterior pelvic tilt, forward head/rounded shoulders, increased thoracic kyphosis   LUMBARAROM/PROM Sig reduction in lumbar extension; all other motions reduced by 25%   LE ROM:             Sig reduction in bil hip extension     TODAY'S TREATMENT 03/21/22 Manual: Scar tissue mobilization: Abdominal scar tissue mobilization Self-care: HEP review D/C planning   TREATMENT 03/14/22 Manual: Scar tissue mobilization: Abdominal scar  tissue mobilization Exercises: Stretches/mobility: Strengthening: Rows 2 x 10, red band Bil UE extension 2 x 10,  red band Self-care: Increasing fiber - fiber supplement to help decrease diarrhea Review of self-bowel massage   TREATMENT 03/07/22 Manual: Scar tissue mobilization: Abdominal scar tissue mobilization Neuromuscular re-education: Core retraining:  Core facilitation: Seated march 2 x 10 Exercises: Stretches/mobility: Lower trunk rotation 3 x 10 Bent knee fall outs 10x bil Strengthening: Seated hip adduction 2 x 10      PATIENT EDUCATION:  Education details: Exercise progressions Person educated: Patient Education method: Consulting civil engineer, Demonstration, Tactile cues, Verbal cues, and Handouts Education comprehension: verbalized understanding     HOME EXERCISE PROGRAM: [id number]   ASSESSMENT:   CLINICAL IMPRESSION: Patient has done excellent in PT intervention with improved frequency/ease of bowel movements, no lower abdominal pain, and large improvement in abdominal scar tissue mobility. We have discussed ongoing low back/thoracic pain; she most likely would benefit from orthopedic PT and was encouraged to talk with MD if she is interested. Due to having met all goals pertinent to reason that patient was referred to PT, she is prepared to DC skilled PT intervention at this time; she was encouraged to call with any questions or concerns.      OBJECTIVE IMPAIRMENTS Abnormal gait, decreased activity tolerance, decreased coordination, decreased endurance, decreased mobility, difficulty walking, decreased strength, hypomobility, increased fascial restrictions, increased muscle spasms, impaired flexibility, impaired tone, postural dysfunction, and pain.    ACTIVITY LIMITATIONS community activity and ADLs.    PERSONAL FACTORS 1-2 comorbidities: vaginal delivery and bowel obstruction with surgical repair and complications  are also affecting patient's functional outcome.       REHAB POTENTIAL: Good   CLINICAL DECISION MAKING: Evolving/moderate complexity   EVALUATION COMPLEXITY: Moderate     GOALS: Goals reviewed with patient? Yes   SHORT TERM GOALS: Target date: 02/05/22 - updated 03/07/22   Pt will be independent with HEP.    Baseline: Goal status: MET   2.  Pt will be able to correctly perform diaphragmatic breathing and appropriate pressure management in order to prevent worsening vaginal wall laxity and improve pelvic floor A/ROM.    Baseline:  Goal status: MET   3.  Pt will be independent with use of squatty potty, relaxed toileting mechanics, and improved bowel movement techniques in order to increase ease of bowel movements and complete evacuation.    Baseline:  Goal status: MET       LONG TERM GOALS: Target date: 03/19/2022 - updated 03/07/22   Pt will be independent with advanced HEP.    Baseline:  Goal status: MET 03/21/22   2.  Pt will demonstrate normal pelvic floor muscle tone and A/ROM, able to achieve 4/5 strength with contractions and 10 sec endurance, in order to provide appropriate lumbopelvic support in functional activities.    Baseline: Patient is performing exercises that will be helpful to improve pelvic floor strength, but internal vaginal exam not likely to formally assess strength since she is making good improvements without. Goal status: DISCHARGE GOAL   3.  Pt will improve abdominal scar tissue restriction to help restore normal mobility to core and bowel.  Baseline: Significant improvements, but still notable restriction present Goal status: MET 03/21/22   4.  Pt will report pain levels no higher than 2/10 in pelvis, abdomen, or low back. Baseline: Patient is not having any pelvic/abdominal pain, but low back pain does persist, most likely due to other causes Goal status: PARTIALLY MET   5.  Pt will be able to go 2-3 hours in between voids without urgency or incontinence in order  to improve QOL and perform all  functional activities with less difficulty.    Baseline: Patient going 2-3 hours between trips to the bathroom to urinate, sometimes longer Goal status: MET   PLAN: PT FREQUENCY: NA   PT DURATION: NA   PLANNED INTERVENTIONS: D/C   PLAN FOR NEXT SESSION: D/C   PHYSICAL THERAPY DISCHARGE SUMMARY  Visits from Start of Care: 8  Current functional level related to goals / functional outcomes: Met/independent   Remaining deficits: See above   Education / Equipment: HEP   Patient agrees to discharge. Patient goals were partially met. Patient is being discharged due to  having met most goals and having tools to continue making progress.    Heather Roberts, PT, DPT06/28/2310:05 AM

## 2022-03-22 DIAGNOSIS — M961 Postlaminectomy syndrome, not elsewhere classified: Secondary | ICD-10-CM | POA: Diagnosis not present

## 2022-03-26 IMAGING — MR MR THORACIC SPINE W/O CM
4 of 7 series · 19 of 48 positions shown · non-contrast
Comparison: Thoracic spine MRI 04/12/2021, CT thoracic spine
01/19/2020

CLINICAL DATA: Back pain radiating to the right and left side pain
in both legs

EXAM:
MRI THORACIC SPINE WITHOUT CONTRAST
TECHNIQUE: Multiplanar, multisequence MR imaging of the thoracic spine was
performed. No intravenous contrast was administered.

[Series 17: T1 · sagittal · 3.0mm · 0.94mm/px · 3 of 18 slices shown]
[im 4/18]
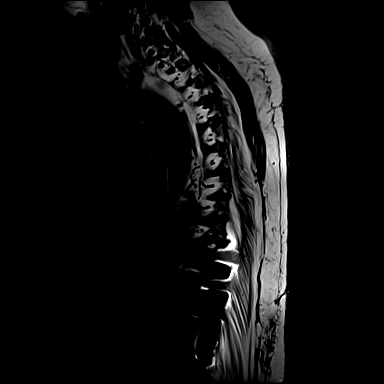
[im 11/18]
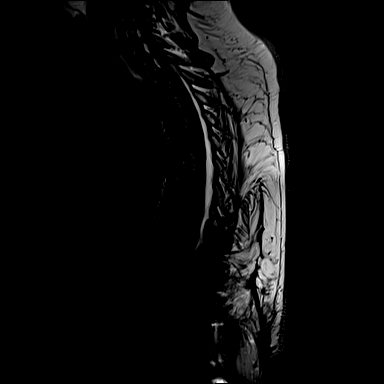
[im 18/18]
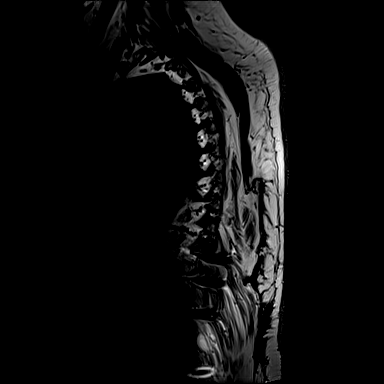

[Series 18: STIR · sagittal · 3.0mm · 1.12mm/px · 3 of 18 slices shown]
[im 1/18]
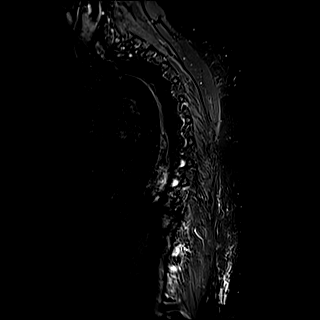
[im 9/18]
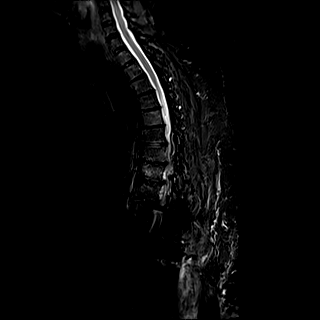
[im 18/18]
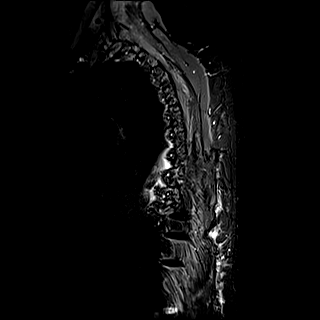

[Series 19: T2 · sagittal · 3.0mm · 0.94mm/px · 5 of 18 slices shown (1 of 2)]
[im 1/18]
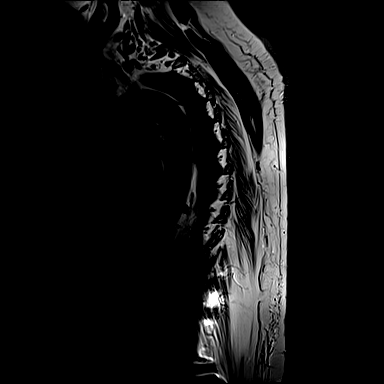
[im 5/18]
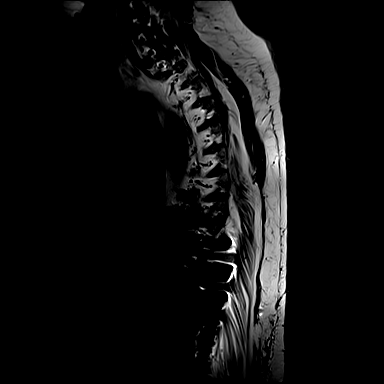
[im 9/18]
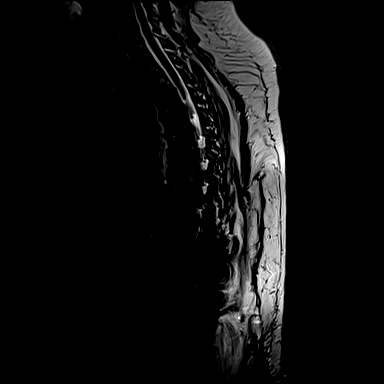
[im 13/18]
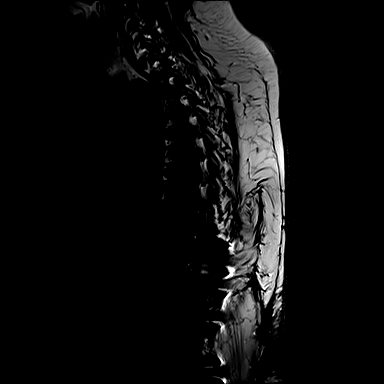
[im 18/18]
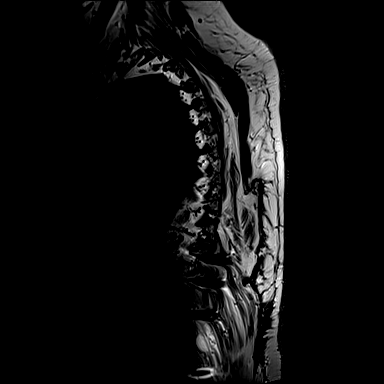

[Series 20: T2 · axial · 4.0mm · 0.28mm/px · z∈[-301,-108]mm · 8 of 39 slices shown (2 of 2)]
[im 1/39]
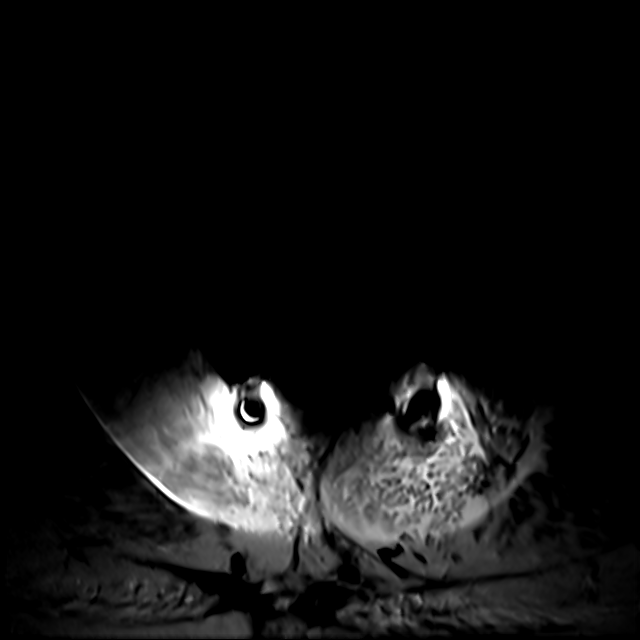
[im 4/39]
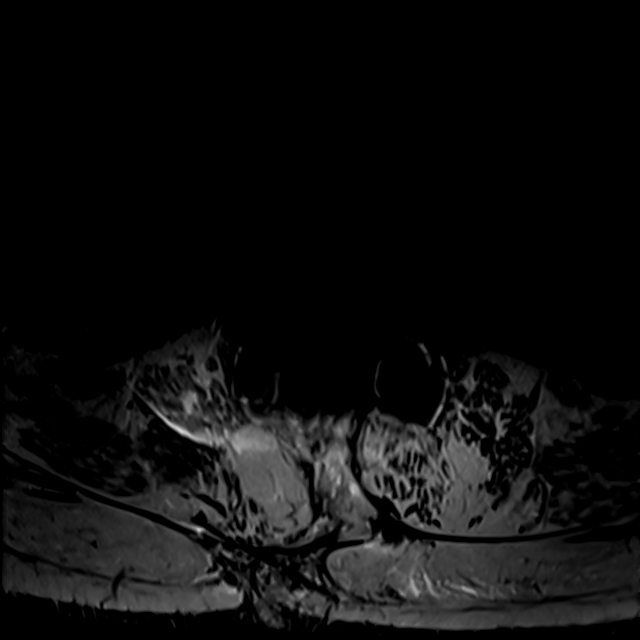
[im 8/39]
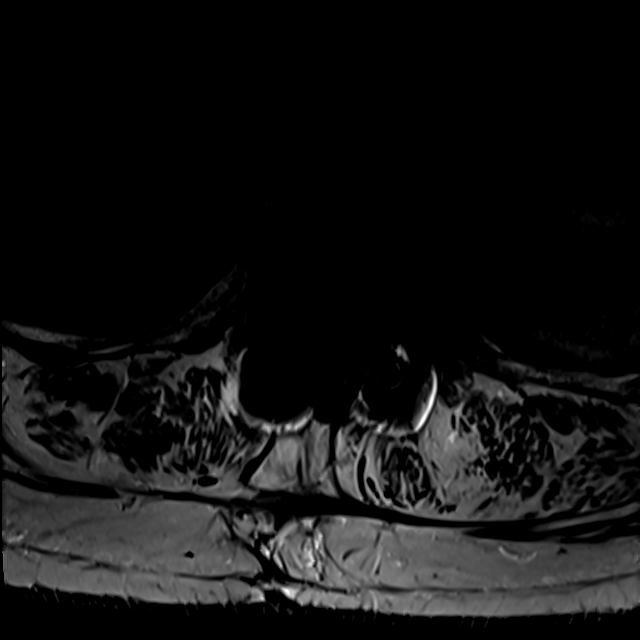
[im 12/39]
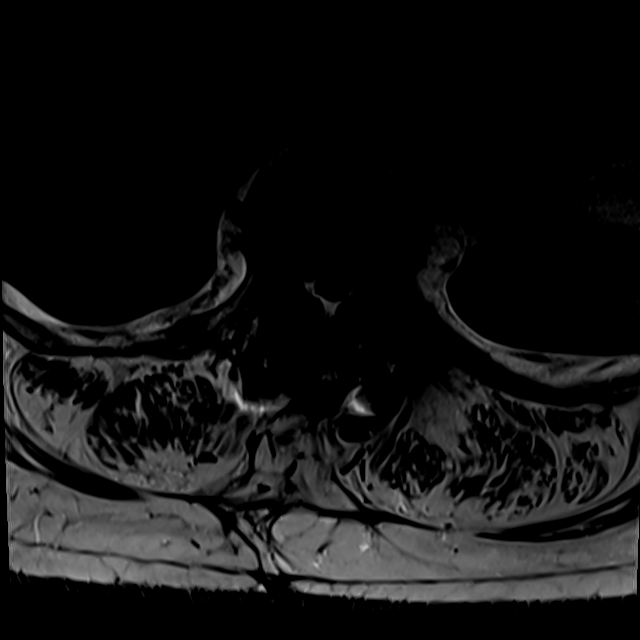
[im 16/39]
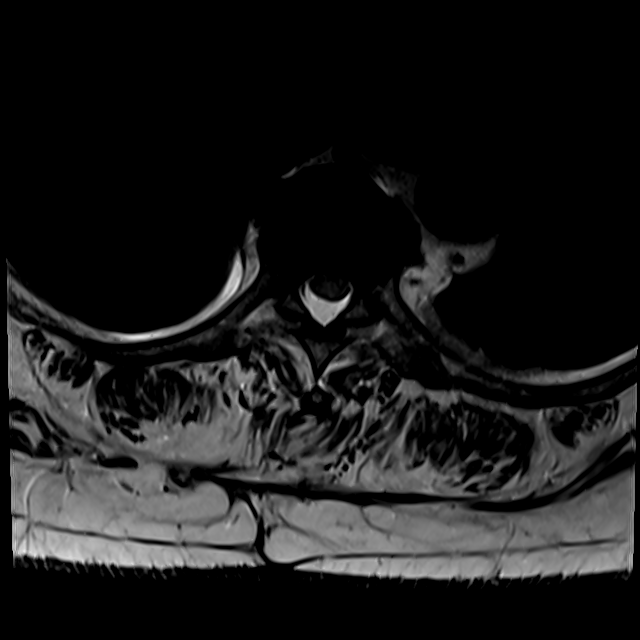
[im 20/39]
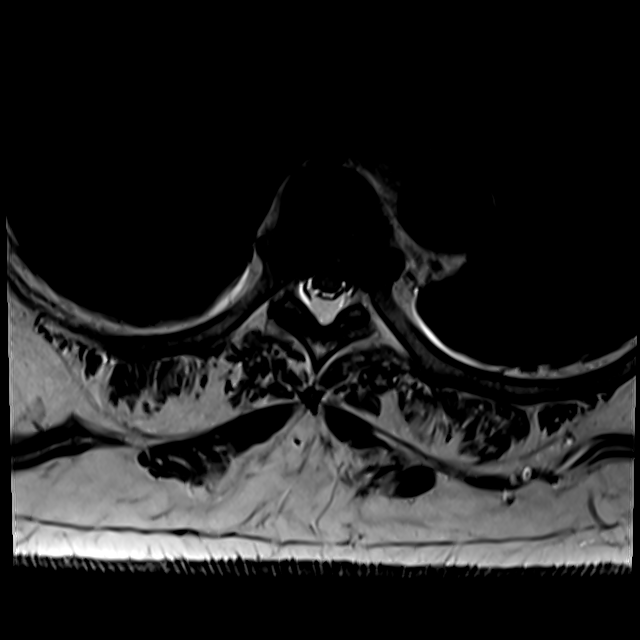
[im 23/39]
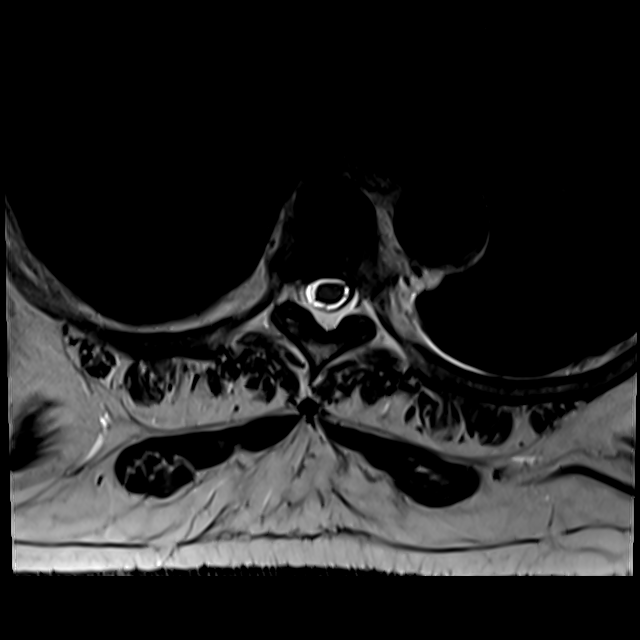
[im 35/39]
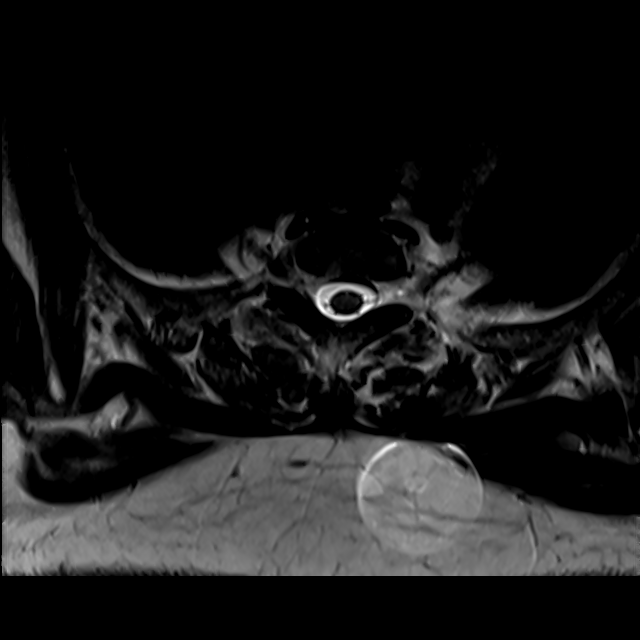

[19 of 48 positions shown; findings below may reference images not displayed]

FINDINGS: Alignment:  Normal.

Vertebrae: Patient is status post posterior instrumented fusion
extending from T10 through the lumbar spine. The lower hardware is
not imaged. Vertebral body heights are preserved. There is no
evidence of acute injury.

There is mild T1 hypointensity in the right aspect of the T5
vertebral body, similar to the prior study, though previously STIR
hyperintensity has decreased. There is mild degenerative endplate
marrow signal abnormality at T8-T9 and T9-T10, similar to the prior
study.

Cord: Cord is suboptimally assessed from T11 and below due to
significant susceptibility from the adjacent hardware. The upper
thoracic cord is normal in signal and morphology.

Paraspinal and other soft tissues: Unremarkable.

Disc levels:

There is mild multilevel disc desiccation and narrowing in the
thoracic spine above the surgical levels, most advanced at T8-T9 and
T9-T10, similar to the prior study.

There are small disc protrusions at T4-T5, T6-T7, T7-T8, T8-T9, and
T9-T10. There is prominent dorsal epidural fat at T4-T5 through
T9-T10, most pronounced at T7 where it measures up to 6 mm in
thickness. This results in up to moderate spinal canal stenosis at
T7-T8, not significantly changed. There is no other significant
spinal canal stenosis.

There is multilevel facet arthropathy with moderate to severe
bilateral neural foraminal stenosis at T8-T9 and T9-T10, not
significantly changed.
IMPRESSION: 1. Postsurgical changes reflecting posterior instrumented fusion
extending from T10 into the lumbar spine. The lumbar hardware is not
imaged.
2. Mild degenerative endplate marrow signal abnormality at T8-T9 and
T9-T10, not significantly changed.
3. Scattered small disc protrusions with prominent dorsal epidural
fat resulting in up to moderate spinal canal stenosis at T7-T8, not
significantly changed.
4. Moderate to severe bilateral neural foraminal stenosis at T8-T9
and T9-T10, also not significantly changed.
5. No evidence of acute injury in the thoracic spine.
6. Unchanged mild T1 hypointensity in the T5 vertebral body, favored
benign given stability and lack of findings on prior PET-CT.

## 2022-04-19 DIAGNOSIS — H6123 Impacted cerumen, bilateral: Secondary | ICD-10-CM | POA: Diagnosis not present

## 2022-04-19 DIAGNOSIS — Z0000 Encounter for general adult medical examination without abnormal findings: Secondary | ICD-10-CM | POA: Diagnosis not present

## 2022-04-19 DIAGNOSIS — I700 Atherosclerosis of aorta: Secondary | ICD-10-CM | POA: Diagnosis not present

## 2022-04-19 DIAGNOSIS — E039 Hypothyroidism, unspecified: Secondary | ICD-10-CM | POA: Diagnosis not present

## 2022-04-19 DIAGNOSIS — I10 Essential (primary) hypertension: Secondary | ICD-10-CM | POA: Diagnosis not present

## 2022-04-19 DIAGNOSIS — K224 Dyskinesia of esophagus: Secondary | ICD-10-CM | POA: Diagnosis not present

## 2022-04-19 DIAGNOSIS — M797 Fibromyalgia: Secondary | ICD-10-CM | POA: Diagnosis not present

## 2022-04-19 DIAGNOSIS — E7800 Pure hypercholesterolemia, unspecified: Secondary | ICD-10-CM | POA: Diagnosis not present

## 2022-04-19 DIAGNOSIS — G609 Hereditary and idiopathic neuropathy, unspecified: Secondary | ICD-10-CM | POA: Diagnosis not present

## 2022-04-19 DIAGNOSIS — F339 Major depressive disorder, recurrent, unspecified: Secondary | ICD-10-CM | POA: Diagnosis not present

## 2022-04-19 DIAGNOSIS — M5441 Lumbago with sciatica, right side: Secondary | ICD-10-CM | POA: Diagnosis not present

## 2022-04-19 DIAGNOSIS — K219 Gastro-esophageal reflux disease without esophagitis: Secondary | ICD-10-CM | POA: Diagnosis not present

## 2022-05-31 DIAGNOSIS — D490 Neoplasm of unspecified behavior of digestive system: Secondary | ICD-10-CM | POA: Diagnosis not present

## 2022-06-08 DIAGNOSIS — H524 Presbyopia: Secondary | ICD-10-CM | POA: Diagnosis not present

## 2022-06-08 DIAGNOSIS — H52203 Unspecified astigmatism, bilateral: Secondary | ICD-10-CM | POA: Diagnosis not present

## 2022-06-08 DIAGNOSIS — H04123 Dry eye syndrome of bilateral lacrimal glands: Secondary | ICD-10-CM | POA: Diagnosis not present

## 2022-06-08 DIAGNOSIS — Z961 Presence of intraocular lens: Secondary | ICD-10-CM | POA: Diagnosis not present

## 2022-06-08 DIAGNOSIS — H43813 Vitreous degeneration, bilateral: Secondary | ICD-10-CM | POA: Diagnosis not present

## 2022-06-19 ENCOUNTER — Other Ambulatory Visit: Payer: Self-pay | Admitting: Neurosurgery

## 2022-06-19 DIAGNOSIS — L89301 Pressure ulcer of unspecified buttock, stage 1: Secondary | ICD-10-CM | POA: Diagnosis not present

## 2022-06-19 DIAGNOSIS — M961 Postlaminectomy syndrome, not elsewhere classified: Secondary | ICD-10-CM | POA: Diagnosis not present

## 2022-06-19 DIAGNOSIS — N898 Other specified noninflammatory disorders of vagina: Secondary | ICD-10-CM | POA: Diagnosis not present

## 2022-06-21 DIAGNOSIS — K31A19 Gastric intestinal metaplasia without dysplasia, unspecified site: Secondary | ICD-10-CM | POA: Diagnosis not present

## 2022-06-21 DIAGNOSIS — K2930 Chronic superficial gastritis without bleeding: Secondary | ICD-10-CM | POA: Diagnosis not present

## 2022-06-21 DIAGNOSIS — K2940 Chronic atrophic gastritis without bleeding: Secondary | ICD-10-CM | POA: Diagnosis not present

## 2022-06-21 DIAGNOSIS — K3189 Other diseases of stomach and duodenum: Secondary | ICD-10-CM | POA: Diagnosis not present

## 2022-06-22 ENCOUNTER — Ambulatory Visit
Admission: RE | Admit: 2022-06-22 | Discharge: 2022-06-22 | Disposition: A | Discharge status: Home / Self Care | Payer: Medicare Other | Source: Ambulatory Visit | Attending: Neurosurgery | Admitting: Neurosurgery

## 2022-06-22 DIAGNOSIS — M546 Pain in thoracic spine: Secondary | ICD-10-CM | POA: Diagnosis not present

## 2022-06-22 DIAGNOSIS — M40204 Unspecified kyphosis, thoracic region: Secondary | ICD-10-CM | POA: Diagnosis not present

## 2022-06-22 DIAGNOSIS — M961 Postlaminectomy syndrome, not elsewhere classified: Secondary | ICD-10-CM

## 2022-06-29 DIAGNOSIS — K2940 Chronic atrophic gastritis without bleeding: Secondary | ICD-10-CM | POA: Diagnosis not present

## 2022-06-29 DIAGNOSIS — K31A19 Gastric intestinal metaplasia without dysplasia, unspecified site: Secondary | ICD-10-CM | POA: Diagnosis not present

## 2022-06-29 DIAGNOSIS — K2930 Chronic superficial gastritis without bleeding: Secondary | ICD-10-CM | POA: Diagnosis not present

## 2022-07-05 DIAGNOSIS — M961 Postlaminectomy syndrome, not elsewhere classified: Secondary | ICD-10-CM | POA: Diagnosis not present

## 2022-07-17 DIAGNOSIS — M961 Postlaminectomy syndrome, not elsewhere classified: Secondary | ICD-10-CM | POA: Diagnosis not present

## 2022-07-26 IMAGING — CT CT T SPINE W/O CM
3 of 4 series · 10 of 20 positions shown, 11 images · non-contrast
Comparison: Thoracic spine MRI 10/23/2021, thoracic spine CT
01/19/2020

CLINICAL DATA: Acute midline upper back pain radiating to bilateral
shoulders and lower neck history of fusion

EXAM:
CT THORACIC SPINE WITHOUT CONTRAST
TECHNIQUE: Multidetector CT images of the thoracic were obtained using the
standard protocol without intravenous contrast.
RADIATION DOSE REDUCTION: This exam was performed according to the
departmental dose-optimization program which includes automated
exposure control, adjustment of the mA and/or kV according to
patient size and/or use of iterative reconstruction technique.

[Series 3: t-spine 2.0 st · axial · 0.31mm/px · z∈[+912,+1110]mm · 4 of 166 slices shown, 5 images]
[im 34/166  soft-tissue]
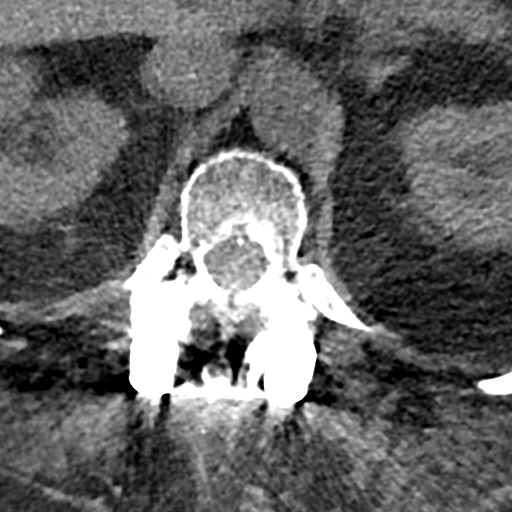
[im 34/166  bone]
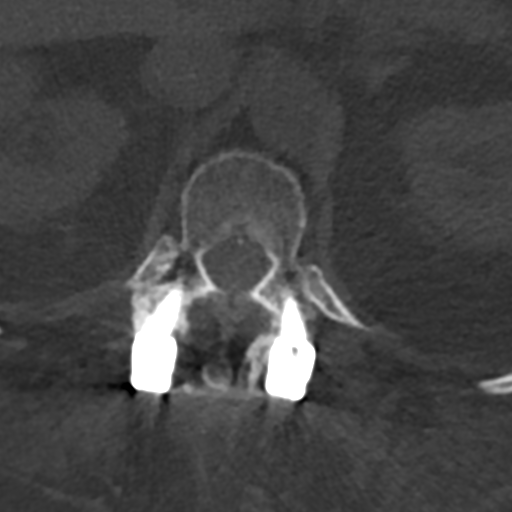
[im 67/166  bone]
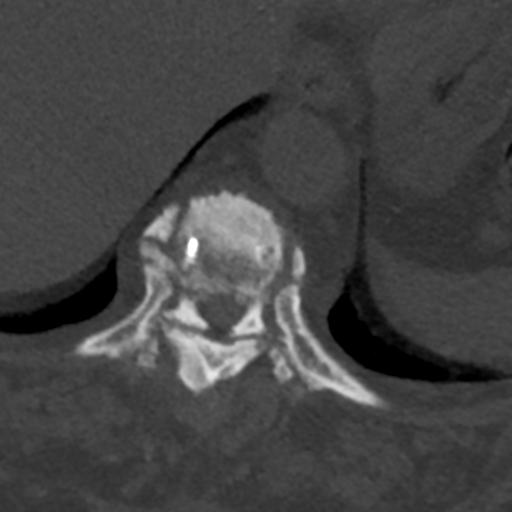
[im 100/166  bone]
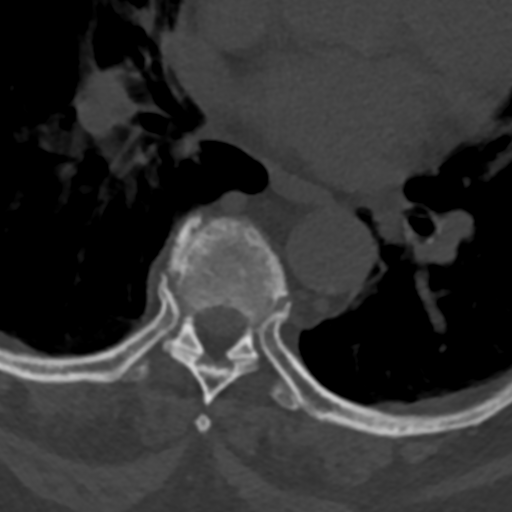
[im 133/166  bone]
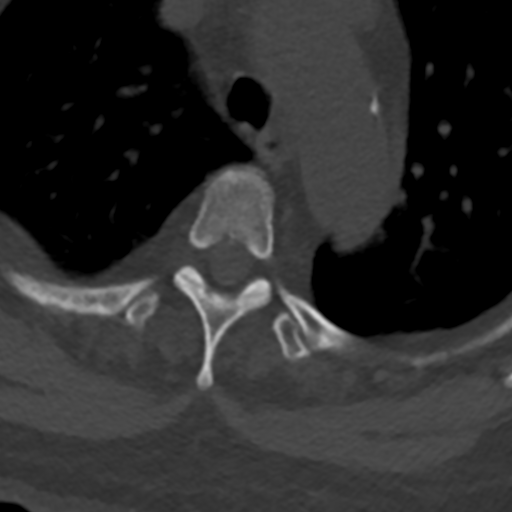

[Series 8: t-spine cor lower bone · coronal · 0.29mm/px · 3 of 61 slices shown]
[im 13/61  bone]
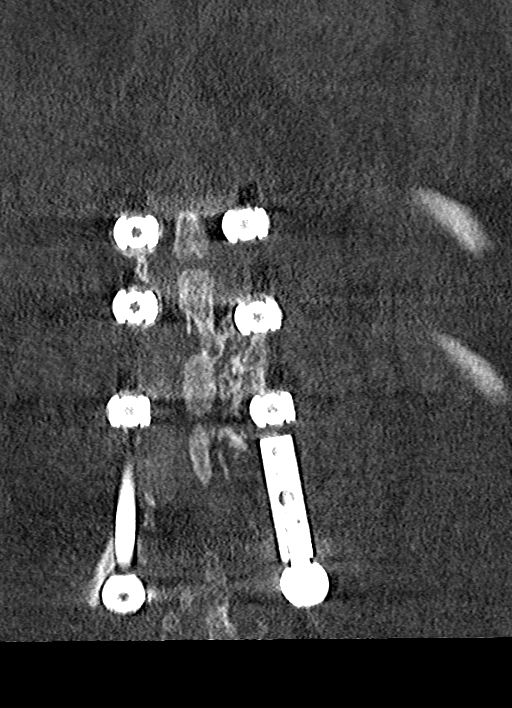
[im 25/61  bone]
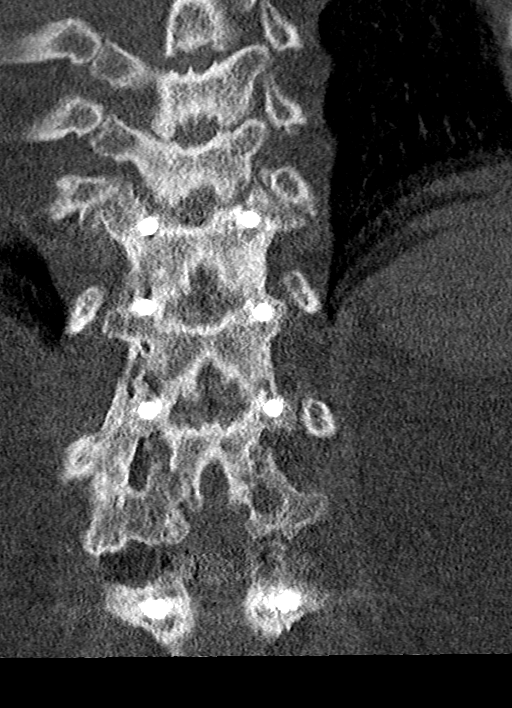
[im 37/61  bone]
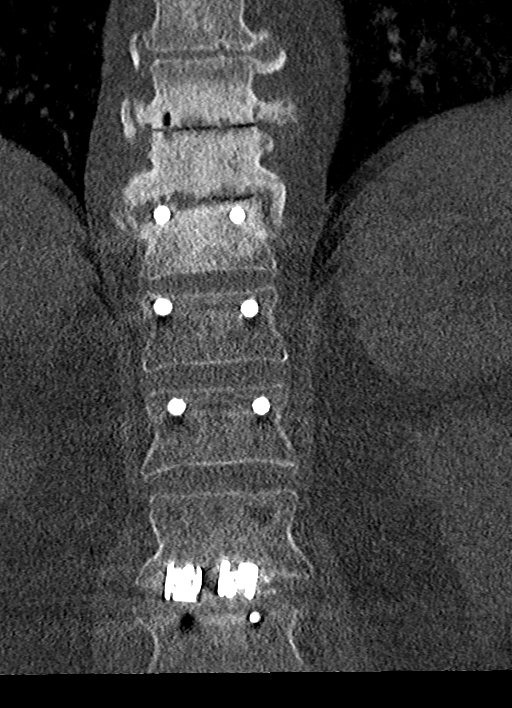

[Series 603: lower axial · axial · 0.34mm/px · z∈[+918,+1025]mm · 3 of 108 slices shown]
[im 27/108  bone]
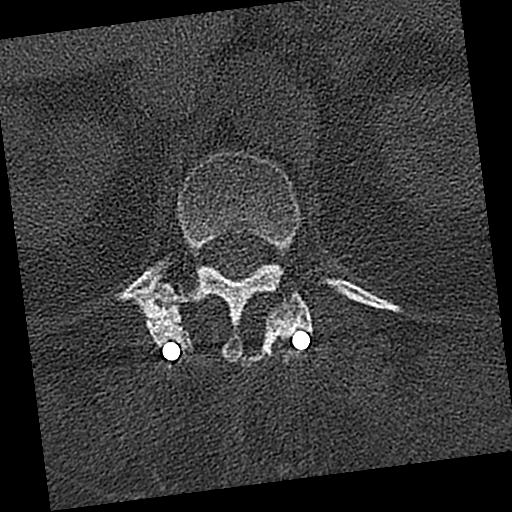
[im 54/108  bone]
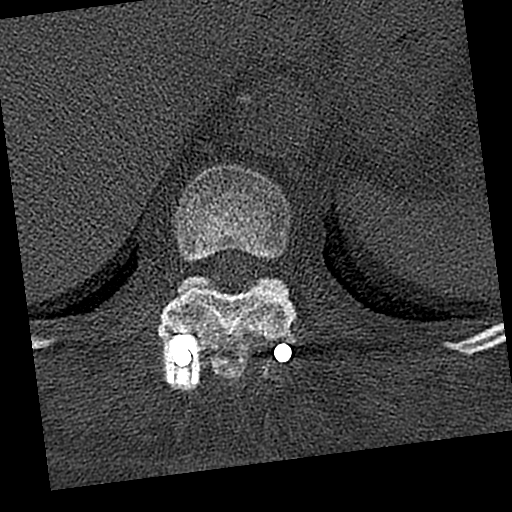
[im 81/108  bone]
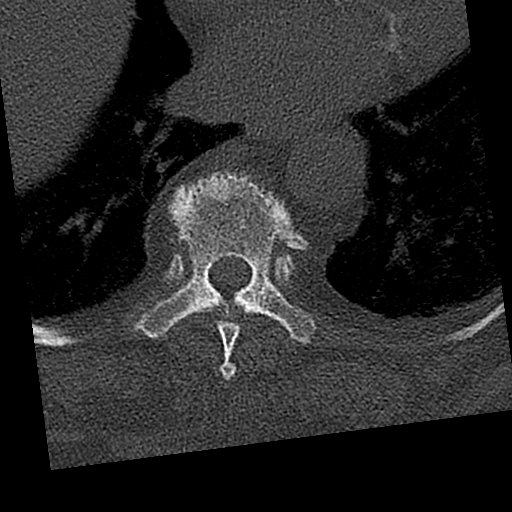

[10 of 20 positions shown; findings below may reference images not displayed]

FINDINGS: Alignment: There is no antero or retrolisthesis.

Vertebrae: Postsurgical changes reflecting posterior instrumented
fusion extending from T10 into the lumbar spine are again seen. The
inferior extent of the hardware is not imaged. A spacer at L1-L2 is
similar in appearance to the study from 01/19/2020. There is no
perihardware lucency or other evidence of complication. There is
fusion of the posterior elements at the imaged surgical levels.

There is sclerosis of the vertebral bodies at T8 through T10, new
since the study from 01/19/2020. There is no significant endplate
erosion or destruction.

Vertebral body heights are preserved, without evidence of acute
injury.

Paraspinal and other soft tissues: Unremarkable.

Disc levels: There is marked disc space narrowing with vacuum disc
phenomenon at T7-T8 through T9-T10. There is prominent dorsal
epidural fat in the thoracic spine resulting in up to moderate
spinal canal stenosis at T7-T8 through T9-T10, better seen on the
prior MRI. Moderate to severe bilateral neural foraminal stenosis
T8-T9 and T9-T10 is also seen, not significantly changed and better
seen on the prior MRI.
IMPRESSION: 1. Sclerosis in the T8 through T10 vertebral bodies is favored
degenerative in nature; however, discitis/osteomyelitis is not
entirely excluded. Correlate with history and laboratory values.
Repeat MRI with contrast may also be considered as indicated.
2. Otherwise, no acute findings in the thoracic spine.
3. Postsurgical changes reflecting posterior instrumented fusion
extending from T10 into the lumbar spine. The inferior extent of the
hardware is not imaged. There is fusion of the posterior elements at
the imaged surgical levels.
4. Moderate spinal canal stenosis at T7-T8 through T9-T10 and
moderate to severe bilateral neural foraminal stenosis at T8-T9 and
T9-T10, better seen on the prior MRI.

## 2022-09-29 ENCOUNTER — Emergency Department (HOSPITAL_COMMUNITY)
Admission: EM | Admit: 2022-09-29 | Discharge: 2022-09-30 | Disposition: A | Discharge status: Home / Self Care | Payer: Medicare Other | Attending: Emergency Medicine | Admitting: Emergency Medicine

## 2022-09-29 ENCOUNTER — Encounter (HOSPITAL_COMMUNITY): Payer: Self-pay | Admitting: Pharmacy Technician

## 2022-09-29 ENCOUNTER — Emergency Department (HOSPITAL_COMMUNITY): Payer: Medicare Other

## 2022-09-29 ENCOUNTER — Other Ambulatory Visit: Payer: Self-pay

## 2022-09-29 DIAGNOSIS — R42 Dizziness and giddiness: Secondary | ICD-10-CM | POA: Diagnosis not present

## 2022-09-29 DIAGNOSIS — Z79899 Other long term (current) drug therapy: Secondary | ICD-10-CM | POA: Insufficient documentation

## 2022-09-29 DIAGNOSIS — M791 Myalgia, unspecified site: Secondary | ICD-10-CM | POA: Insufficient documentation

## 2022-09-29 DIAGNOSIS — H532 Diplopia: Secondary | ICD-10-CM | POA: Diagnosis not present

## 2022-09-29 DIAGNOSIS — I10 Essential (primary) hypertension: Secondary | ICD-10-CM | POA: Diagnosis not present

## 2022-09-29 DIAGNOSIS — Z20822 Contact with and (suspected) exposure to covid-19: Secondary | ICD-10-CM | POA: Insufficient documentation

## 2022-09-29 DIAGNOSIS — Z91040 Latex allergy status: Secondary | ICD-10-CM | POA: Insufficient documentation

## 2022-09-29 DIAGNOSIS — R519 Headache, unspecified: Secondary | ICD-10-CM | POA: Diagnosis not present

## 2022-09-29 DIAGNOSIS — R509 Fever, unspecified: Secondary | ICD-10-CM | POA: Diagnosis not present

## 2022-09-29 DIAGNOSIS — J45909 Unspecified asthma, uncomplicated: Secondary | ICD-10-CM | POA: Insufficient documentation

## 2022-09-29 DIAGNOSIS — G4489 Other headache syndrome: Secondary | ICD-10-CM | POA: Diagnosis not present

## 2022-09-29 LAB — CBC WITH DIFFERENTIAL/PLATELET
Abs Immature Granulocytes: 0.01 10*3/uL (ref 0.00–0.07)
Basophils Absolute: 0 10*3/uL (ref 0.0–0.1)
Basophils Relative: 0 %
Eosinophils Absolute: 0.1 10*3/uL (ref 0.0–0.5)
Eosinophils Relative: 1 %
HCT: 40 % (ref 36.0–46.0)
Hemoglobin: 13.5 g/dL (ref 12.0–15.0)
Immature Granulocytes: 0 %
Lymphocytes Relative: 35 %
Lymphs Abs: 1.7 10*3/uL (ref 0.7–4.0)
MCH: 31 pg (ref 26.0–34.0)
MCHC: 33.8 g/dL (ref 30.0–36.0)
MCV: 91.7 fL (ref 80.0–100.0)
Monocytes Absolute: 0.6 10*3/uL (ref 0.1–1.0)
Monocytes Relative: 13 %
Neutro Abs: 2.5 10*3/uL (ref 1.7–7.7)
Neutrophils Relative %: 51 %
Platelets: 313 10*3/uL (ref 150–400)
RBC: 4.36 MIL/uL (ref 3.87–5.11)
RDW: 14.5 % (ref 11.5–15.5)
WBC: 4.9 10*3/uL (ref 4.0–10.5)
nRBC: 0 % (ref 0.0–0.2)

## 2022-09-29 LAB — RESP PANEL BY RT-PCR (RSV, FLU A&B, COVID)  RVPGX2
Influenza A by PCR: NEGATIVE
Influenza B by PCR: NEGATIVE
Resp Syncytial Virus by PCR: NEGATIVE
SARS Coronavirus 2 by RT PCR: NEGATIVE

## 2022-09-29 LAB — COMPREHENSIVE METABOLIC PANEL
ALT: 16 U/L (ref 0–44)
AST: 21 U/L (ref 15–41)
Albumin: 3.8 g/dL (ref 3.5–5.0)
Alkaline Phosphatase: 103 U/L (ref 38–126)
Anion gap: 9 (ref 5–15)
BUN: 11 mg/dL (ref 8–23)
CO2: 27 mmol/L (ref 22–32)
Calcium: 8.9 mg/dL (ref 8.9–10.3)
Chloride: 99 mmol/L (ref 98–111)
Creatinine, Ser: 1.01 mg/dL — ABNORMAL HIGH (ref 0.44–1.00)
GFR, Estimated: 58 mL/min — ABNORMAL LOW (ref >60)
Glucose, Bld: 109 mg/dL — ABNORMAL HIGH (ref 70–99)
Potassium: 3.5 mmol/L (ref 3.5–5.1)
Sodium: 135 mmol/L (ref 135–145)
Total Bilirubin: 0.7 mg/dL (ref 0.3–1.2)
Total Protein: 7.5 g/dL (ref 6.5–8.1)

## 2022-09-29 MED ORDER — LORAZEPAM 1 MG PO TABS
1.0000 mg | ORAL_TABLET | Freq: Once | ORAL | Status: AC
  Administered 2022-09-29: 1 mg via ORAL
  Filled 2022-09-29: qty 1

## 2022-09-29 MED ORDER — MORPHINE SULFATE (PF) 4 MG/ML IV SOLN
4.0000 mg | Freq: Once | INTRAVENOUS | Status: AC
  Administered 2022-09-29: 4 mg via INTRAVENOUS
  Filled 2022-09-29: qty 1

## 2022-09-29 NOTE — ED Provider Triage Note (Signed)
Emergency Medicine Provider Triage Evaluation Note  Amy [name] , a 77 y.o. female  was evaluated in triage.  Pt presenting with multiple bodily complaints.  These include intermittent headaches, increased urinary frequency, and left flank pain over the last week or 2.  Currently with headache, worst on the right side of the head around the eye.  Denies vision loss or changes.  Denies dysuria or hematuria.  Denies changes in bowel habits.  Denies abdominal pain, though endorses occasional chest discomfort that last for a few seconds and relieves on its own.  Hx of HTN, fibromyalgia, anxiety, chronic back pain, prior DVT, asthma, headaches  Review of Systems  Positive:  Negative: See above  Physical Exam  There were no vitals taken for this visit. Gen:   Awake, no distress   Resp:  Normal effort, CTAB MSK:   Moves extremities without difficulty  Other:  Abdomen soft, non-TTP.  Nondistended.  Medical Decision Making  Medically screening exam initiated at 5:44 PM.  Appropriate orders placed.  Amy [name] was informed that the remainder of the evaluation will be completed by another provider, this initial triage assessment does not replace that evaluation, and the importance of remaining in the ED until their evaluation is complete.     Prince Rome, PA-C [id number]

## 2022-09-29 NOTE — ED Provider Notes (Signed)
[facility] EMERGENCY DEPARTMENT Provider Note   CSN: [id number] Arrival date & time: 09/29/22  1724     History  No chief complaint on file.   Amy [name] is a 77 y.o. female.  The history is provided by the patient.  She has history of hypertension, hyperlipidemia, asthma, GERD, peripheral vascular disease and comes in complaining of hurting everywhere for the last 3 days.  During that time, she has checked her blood pressure and it has been as high as 644 systolic.  She is complaining of a global headache and endorses occasional diplopia but denies nausea or vomiting.  She does endorse some heaviness in her chest but also generalized bodyaches.  She has chronic pain, but she has noted new pain which is present on both thighs and into the her flanks.  She denies fever, chills, sweats.  She denies any cough.  She has taken acetaminophen which gives slight, temporary relief.  She also relates that she normally walks with a cane and has been unable to walk because her balance is off.   Home Medications Prior to Admission medications   Medication Sig Start Date End Date Taking? Authorizing Provider  acetaminophen (TYLENOL) 500 MG tablet Take 1,000 mg by mouth every 6 (six) hours as needed for moderate pain.     [provider]  atorvastatin (LIPITOR) 20 MG tablet Take 1 tablet (20 mg total) by mouth daily. 01/16/21   Kate Sable, MD  B Complex Vitamins (B COMPLEX PO) Take 1 tablet by mouth daily.    [provider]  cloNIDine (CATAPRES) 0.1 MG tablet Take 1 tablet (0.1 mg total) by mouth 3 (three) times daily. 08/01/16   Meuth, Brooke A, PA-C  diphenhydrAMINE (BENADRYL) 25 MG tablet Take 25 mg by mouth 2 (two) times daily as needed for allergies.    [provider]  estradiol (ESTRACE) 0.1 MG/GM vaginal cream Place 1 Applicatorful vaginally 2 (two) times a week. 08/02/16   Meuth, Brooke A, PA-C  fluticasone (FLONASE) 50 MCG/ACT  nasal spray Place 1 spray into both nostrils daily. 08/02/16   Meuth, Brooke A, PA-C  furosemide (LASIX) 20 MG tablet Take 1 tablet (20 mg total) by mouth daily as needed for edema. PLEASE SCHEDULE OFFICE VISIT FOR FURTHER REFILLS. THANK YOU! 01/29/22 04/29/22  Kate Sable, MD  gabapentin (NEURONTIN) 600 MG tablet Take 600-1,200 mg by mouth 2 (two) times daily. Take '600mg'$  in the AM and 1,'200mg'$  in the PM 11/30/19   [provider]  hydrALAZINE (APRESOLINE) 50 MG tablet Take 50 mg by mouth 3 (three) times daily.    [provider]  hydrochlorothiazide (HYDRODIURIL) 12.5 MG tablet Take 12.5 mg by mouth daily. 05/08/19   [provider]  levothyroxine (SYNTHROID) 25 MCG tablet Take 25 mcg by mouth every morning. 02/12/22   [provider]  Menthol, Topical Analgesic, (ICY HOT BACK EX) Apply 1 application topically 2 (two) times daily as needed (pain).    [provider]  methocarbamol (ROBAXIN) 500 MG tablet Take 500 mg by mouth 4 (four) times daily as needed for muscle spasms. 03/28/21   [provider]  metoprolol (LOPRESSOR) 50 MG tablet Take 1 tablet (50 mg total) by mouth 2 (two) times daily. 08/01/16   Meuth, Brooke A, PA-C  pantoprazole (PROTONIX) 40 MG tablet Take 80 mg by mouth daily.  01/17/18   [provider]  PARoxetine (PAXIL) 30 MG tablet Take 30 mg by mouth daily.  [provider]  potassium chloride SA (K-DUR,KLOR-CON) 20 MEQ tablet Take 20 mEq by mouth daily.    [provider]  traMADol (ULTRAM) 50 MG tablet Take 50 mg by mouth every 6 (six) hours as needed for moderate pain or severe pain. 06/28/21   [provider]  vitamin E 180 MG (400 UNITS) capsule Take 400 Units by mouth daily.    [provider]      Allergies    Latex, Crestor [rosuvastatin calcium], Penicillins, Tetanus toxoids, and Tomato    Review of Systems   Review of Systems  All other systems reviewed and are  negative.   Physical Exam Updated Vital Signs BP (!) 188/89 (BP Location: Right Arm)   Pulse (!) 59   Temp 98.3 F (36.8 C) (Oral)   Resp 14   SpO2 100%  Physical Exam Vitals and nursing note reviewed.   77 year old female, resting comfortably and in no acute distress. Vital signs are significant for elevated blood pressure. Oxygen saturation is 100%, which is normal. Head is normocephalic and atraumatic. PERRLA, EOMI. Oropharynx is clear.  There is tenderness to palpation over the temporal arteries bilaterally.  Fundi showed no hemorrhage, exudate, papilledema. Neck is nontender and supple without adenopathy or JVD. Back is nontender and there is no CVA tenderness. Lungs are clear without rales, wheezes, or rhonchi. Chest is moderately tender diffusely.  There is no crepitus. Heart has regular rate and rhythm without murmur. Abdomen is soft, flat, nontender. Extremities have no cyanosis or edema, full range of motion is present.  She endorses tenderness over both shoulders hips and proximal thighs. Skin is warm and dry without rash. Neurologic: Mental status is normal, cranial nerves are intact, strength is 5/5 in all 4 extremities.  Finger-to-nose testing shows significant ataxia bilaterally.  ED Results / Procedures / Treatments   Labs (all labs ordered are listed, but only abnormal results are displayed) Labs Reviewed  COMPREHENSIVE METABOLIC PANEL - Abnormal; Notable for the following components:      Result Value   Glucose, Bld 109 (*)    Creatinine, Ser 1.01 (*)    GFR, Estimated 58 (*)    All other components within normal limits  URINALYSIS, ROUTINE W REFLEX MICROSCOPIC - Abnormal; Notable for the following components:   APPearance CLOUDY (*)    All other components within normal limits  SEDIMENTATION RATE - Abnormal; Notable for the following components:   Sed Rate 23 (*)    All other components within normal limits  RESP PANEL BY RT-PCR (RSV, FLU A&B, COVID)   RVPGX2  CBC WITH DIFFERENTIAL/PLATELET  MAGNESIUM  TROPONIN I (HIGH SENSITIVITY)    EKG EKG Interpretation  Date/Time:  Saturday September 29 2022 18:25:32 EST Ventricular Rate:  62 PR Interval:  164 QRS Duration: 82 QT Interval:  422 QTC Calculation: 428 R Axis:   -31 Text Interpretation: Normal sinus rhythm Left axis deviation Low voltage QRS Abnormal ECG When compared with ECG of 06-Jul-2021 15:28, No significant change was found Confirmed by Delora Fuel ([id number]) on 09/29/2022 11:19:36 PM  Radiology CT Head Wo Contrast  Result Date: 09/29/2022 CLINICAL DATA:  Headache. EXAM: CT HEAD WITHOUT CONTRAST TECHNIQUE: Contiguous axial images were obtained from the base of the skull through the vertex without intravenous contrast. RADIATION DOSE REDUCTION: This exam was performed according to the departmental dose-optimization program which includes automated exposure control, adjustment of the mA and/or kV according to patient size and/or use of iterative reconstruction technique. COMPARISON:  March 20, 2021 FINDINGS: Brain: There is mild cerebral atrophy with widening of the extra-axial spaces and ventricular dilatation. There are areas of decreased attenuation within the white matter tracts of the supratentorial brain, consistent with microvascular disease changes. Vascular: There is marked severity calcification of the bilateral cavernous carotid arteries. Skull: Normal. Negative for fracture or focal lesion. Sinuses/Orbits: No acute finding. Other: None. IMPRESSION: 1. No acute intracranial abnormality. 2. Generalized cerebral atrophy and microvascular disease changes of the supratentorial brain. Electronically Signed   By: Virgina Norfolk M.D.   On: 09/29/2022 19:31    Procedures Procedures  Cardiac monitor shows normal sinus rhythm, per my interpretation.  Medications Ordered in ED Medications  morphine (PF) 4 MG/ML injection 4 mg (4 mg Intravenous Given 09/29/22 2348)  LORazepam (ATIVAN)  tablet 1 mg (1 mg Oral Given 09/29/22 2349)    ED Course/ Medical Decision Making/ A&P                           Medical Decision Making Amount and/or Complexity of Data Reviewed Labs: ordered. Radiology: ordered.  Risk Prescription drug management.   Multiple complaints which may be related.  Headache appears benign.  I do not feel it is related to her blood pressure but feel her blood pressure is more related to response to pain.  Pain in her temporal artery area and shoulder and hip girdles suggests possible polymyalgia rheumatica.  Abnormal finger-nose testing is concerning for possible posterior circulation stroke.  Vague chest discomfort I feels more part of her general condition than ACS, but I have ordered a single troponin to make sure there is not underlying ACS.  Possible viral illness such as influenza, RSV, COVID-19.  I have reviewed and interpreted her laboratory test, and my interpretation is normal CBC, mildly elevated random glucose and otherwise normal comprehensive metabolic panel.  CT head shows no acute intracranial abnormality but generalized cerebral atrophy and microvascular changes.  I am independently viewed the images, and agree with radiologist interpretation.  I have reviewed and interpreted her electrocardiogram and my interpretation is left axis deviation with no ST or T changes and unchanged from prior.  In addition to above-noted laboratory testing, I am adding a sedimentation rate to evaluate for possible polymyalgia rheumatica and I have ordered an MRI of the brain to look for evidence of possible stroke.  I have ordered a dose of morphine to see if blood pressure responds to improvement in pain.  I have reviewed her past records, and office visit on 07/31/2021 did have blood pressure 172/83 which is not dramatically different from today's, ED visit on 07/06/2021 had blood pressure 186/97 which is virtually identical to today's.  MRI shows no evidence of stroke or  other acute process.  Sedimentation rate is virtually normal, polymyalgia rheumatica very unlikely.  Blood pressure has come down dramatically following above-noted treatment.  Patient was able to ambulate in the emergency department, no evidence of any acute process requiring hospitalization.  I have encouraged the patient to use over-the-counter NSAIDs and acetaminophen as needed for pain, follow-up with her primary care provider.  She is to return for any new or concerning symptoms.  Final Clinical Impression(s) / ED Diagnoses Final diagnoses:  Myalgia  Elevated blood pressure reading with diagnosis of hypertension    Rx / DC Orders ED Discharge Orders     None         Delora Fuel, MD 89/21/19 [phone]

## 2022-09-29 NOTE — ED Triage Notes (Signed)
Pt bib ems from home with L sided flank pain with malaise along with headache, cough for the last few days. 20g R hand. Given '324mg'$  aspirin.  101.54F HR 64 CBG 125 178/82

## 2022-09-30 ENCOUNTER — Emergency Department (HOSPITAL_COMMUNITY): Payer: Medicare Other

## 2022-09-30 DIAGNOSIS — R519 Headache, unspecified: Secondary | ICD-10-CM | POA: Diagnosis not present

## 2022-09-30 DIAGNOSIS — I10 Essential (primary) hypertension: Secondary | ICD-10-CM | POA: Diagnosis not present

## 2022-09-30 DIAGNOSIS — R42 Dizziness and giddiness: Secondary | ICD-10-CM | POA: Diagnosis not present

## 2022-09-30 LAB — URINALYSIS, ROUTINE W REFLEX MICROSCOPIC
Bilirubin Urine: NEGATIVE
Glucose, UA: NEGATIVE mg/dL
Hgb urine dipstick: NEGATIVE
Ketones, ur: NEGATIVE mg/dL
Leukocytes,Ua: NEGATIVE
Nitrite: NEGATIVE
Protein, ur: NEGATIVE mg/dL
Specific Gravity, Urine: 1.015 (ref 1.005–1.030)
pH: 6 (ref 5.0–8.0)

## 2022-09-30 LAB — SEDIMENTATION RATE: Sed Rate: 23 mm/hr — ABNORMAL HIGH (ref 0–22)

## 2022-09-30 LAB — TROPONIN I (HIGH SENSITIVITY): Troponin I (High Sensitivity): 3 ng/L (ref <18)

## 2022-09-30 LAB — MAGNESIUM: Magnesium: 2 mg/dL (ref 1.7–2.4)

## 2022-09-30 NOTE — Discharge Instructions (Signed)
Your evaluation did not show any sign of a serious problem and did not show any sign of infection with COVID or influenza or RSV.  Take acetaminophen and/or ibuprofen as needed for pain.  Please note that if you combine acetaminophen and ibuprofen, you will get better pain relief and you get from taking either medication by itself.  Return if you have any new or concerning symptoms.

## 2022-10-19 DIAGNOSIS — I5033 Acute on chronic diastolic (congestive) heart failure: Secondary | ICD-10-CM | POA: Diagnosis not present

## 2022-10-19 DIAGNOSIS — N952 Postmenopausal atrophic vaginitis: Secondary | ICD-10-CM | POA: Diagnosis not present

## 2022-10-19 DIAGNOSIS — E7800 Pure hypercholesterolemia, unspecified: Secondary | ICD-10-CM | POA: Diagnosis not present

## 2022-10-19 DIAGNOSIS — R0789 Other chest pain: Secondary | ICD-10-CM | POA: Diagnosis not present

## 2022-10-19 DIAGNOSIS — I10 Essential (primary) hypertension: Secondary | ICD-10-CM | POA: Diagnosis not present

## 2022-10-19 DIAGNOSIS — R52 Pain, unspecified: Secondary | ICD-10-CM | POA: Diagnosis not present

## 2022-10-19 DIAGNOSIS — I700 Atherosclerosis of aorta: Secondary | ICD-10-CM | POA: Diagnosis not present

## 2022-10-19 DIAGNOSIS — F339 Major depressive disorder, recurrent, unspecified: Secondary | ICD-10-CM | POA: Diagnosis not present

## 2022-10-19 DIAGNOSIS — K224 Dyskinesia of esophagus: Secondary | ICD-10-CM | POA: Diagnosis not present

## 2022-10-19 DIAGNOSIS — E039 Hypothyroidism, unspecified: Secondary | ICD-10-CM | POA: Diagnosis not present

## 2022-10-25 ENCOUNTER — Other Ambulatory Visit
Admission: RE | Admit: 2022-10-25 | Discharge: 2022-10-25 | Disposition: A | Discharge status: Home / Self Care | Payer: Medicare Other | Source: Ambulatory Visit | Attending: Cardiology | Admitting: Cardiology

## 2022-10-25 ENCOUNTER — Encounter: Payer: Self-pay | Admitting: Cardiology

## 2022-10-25 ENCOUNTER — Ambulatory Visit: Payer: Medicare Other | Attending: Cardiology | Admitting: Cardiology

## 2022-10-25 VITALS — BP 128/74 | HR 68 | Ht 65.0 in | Wt 282.4 lb

## 2022-10-25 DIAGNOSIS — R079 Chest pain, unspecified: Secondary | ICD-10-CM | POA: Insufficient documentation

## 2022-10-25 DIAGNOSIS — I10 Essential (primary) hypertension: Secondary | ICD-10-CM

## 2022-10-25 DIAGNOSIS — I5189 Other ill-defined heart diseases: Secondary | ICD-10-CM

## 2022-10-25 DIAGNOSIS — R0600 Dyspnea, unspecified: Secondary | ICD-10-CM | POA: Diagnosis not present

## 2022-10-25 LAB — BASIC METABOLIC PANEL
Anion gap: 7 (ref 5–15)
BUN: 18 mg/dL (ref 8–23)
CO2: 29 mmol/L (ref 22–32)
Calcium: 9.1 mg/dL (ref 8.9–10.3)
Chloride: 101 mmol/L (ref 98–111)
Creatinine, Ser: 1.01 mg/dL — ABNORMAL HIGH (ref 0.44–1.00)
GFR, Estimated: 58 mL/min — ABNORMAL LOW (ref >60)
Glucose, Bld: 107 mg/dL — ABNORMAL HIGH (ref 70–99)
Potassium: 4.4 mmol/L (ref 3.5–5.1)
Sodium: 137 mmol/L (ref 135–145)

## 2022-10-25 MED ORDER — FUROSEMIDE 20 MG PO TABS: 20.0000 mg | ORAL_TABLET | Freq: Every day | ORAL | 3 refills | Status: DC

## 2022-10-25 MED ORDER — METOPROLOL TARTRATE 100 MG PO TABS: 100.0000 mg | ORAL_TABLET | Freq: Once | ORAL | 0 refills | Status: DC

## 2022-10-25 NOTE — Progress Notes (Signed)
Cardiology Office Note:    Date:  10/25/2022   ID:  Amy [name], DOB May 27, 1946, MRN [id number]  PCP:  Pa, [facility]  Cardiologist:  Kate Sable, MD  Advanced Practice Provider:  No care team member to display Electrophysiologist:  None       Referring MD: [facility]*   Chief Complaint  Patient presents with   Follow-up    Enema, SOB,  CHF, chest pain    History of Present Illness:    Amy [name] is a 77 y.o. female with a hx of anxiety, HFpEF, hypertension, hyperlipidemia, OSA on CPAP, morbid obesity, chronic back pain, who presents for follow-up.    Complaints of abnormal chest pain/pressure not associated with exertion.  Symptoms get worse when she lays down, also endorses shortness of breath.  Not compliant with her CPAP mask.  Has not used her CPAP mask over the past 3 months.  She is out of Lasix which she typically takes for edema.  Was recently sick with symptoms of nausea, fever up to 103.  Took some over-the-counter medications.  Endorses orthopnea, no significant edema.  Prior notes Lexiscan Myoview 01/2021 no evidence for ischemia, low risk study. Echo 8/41/3244 normal systolic function, EF 60 to 65%, impaired relaxation. Echo 06/2016 showed normal systolic function, EF 60 to 65%.  Impaired relaxation.  Past Medical History:  Diagnosis Date   Abdominal pain of unknown etiology 02/05/2016   Anxiety    Arthritis    ddd- RA   Asthma    "sleeping asthma"   Chronic back pain    lumbar steroid injection's   Complication of anesthesia    Hard to wake up. Pt sts is took 3 days.   Constipation    Depression    Dysphonia    intermittent "voice changes"   Esophageal dysmotility    Fibromyalgia    Fibromyalgia    GERD (gastroesophageal reflux disease)    Glaucoma    Headache    High cholesterol    ? pt states her doctor told her to continue eating vegetables  & fruit   History of DVT (deep vein thrombosis) 1989   LEFT UPPER ARM   History of hiatal hernia    History of MRSA infection 2011   Hypertension    Hypothyroidism    Irregular heart rate    "years ago"   Low iron    Lumbago    Mild obstructive sleep apnea    per study 02-07-2006 - no cpap   Neuropathy    feet   Pelvic pain in female    Peripheral vascular disease ([place])    "poor circulation"   SBO (small bowel obstruction) ([place]) 06/2016   Seasonal allergies    Short of breath on exertion    Urinary frequency    Vaginal atrophy    Weakness    both hands and feet    Past Surgical History:  Procedure Laterality Date   abdominal adhesions removed     ABDOMINAL HYSTERECTOMY  1978   BACK SURGERY     BUNIONECTOMY Left 2008   CATARACT EXTRACTION Bilateral 2017   COLON SURGERY     COLONOSCOPY     CYSTO WITH HYDRODISTENSION N/A 07/12/2015   Procedure: CYSTOSCOPY/HYDRODISTENSION;  Surgeon: Bjorn Loser, MD;  Location: [facility];  Service: Urology;  Laterality: N/A;   DILATION AND CURETTAGE OF UTERUS     ESOPHAGEAL MANOMETRY N/A  11/22/2014   Procedure: ESOPHAGEAL MANOMETRY (EM);  Surgeon: Winfield Cunas., MD;  Location: [facility];  Service: Endoscopy;  Laterality: N/A;   ESOPHAGOGASTRODUODENOSCOPY N/A 07/15/2013   Procedure: ESOPHAGOGASTRODUODENOSCOPY (EGD);  Surgeon: Winfield Cunas., MD;  Location: [facility];  Service: Endoscopy;  Laterality: N/A;  need xray   EXPLORATORY LAPAROTOMY     EYE SURGERY Bilateral    cataract surgery with lens implants   HARDWARE REMOVAL N/A 01/02/2016   Procedure: Exploration of Lumbar Fusion,Removal of hardware Lumbar One-Two ;Redo Posterior Lumbar Fusion Lumbar One-Two;  Surgeon: Kary Kos, MD;  Location: [facility];  Service: Neurosurgery;  Laterality: N/A;   ingrown toenail removal     LAPAROSCOPIC CHOLECYSTECTOMY  01-11-2006   LAPAROSCOPY N/A 07/06/2016   Procedure: LAPAROSCOPY DIAGNOSTIC EMERGENT TO OPEN;   Surgeon: Greer Pickerel, MD;  Location: [facility];  Service: General;  Laterality: N/A;   LAPAROTOMY N/A 07/06/2016   Procedure: EXPLORATORY LAPAROTOMY LYSIS OF ADHESIONS FOR 3 HOURS;  Surgeon: Greer Pickerel, MD;  Location: [facility];  Service: General;  Laterality: N/A;   lipoma removal     LUMBAR FUSION  2014   L4 -- L5   MASS EXCISION Left 02/20/2013   Procedure: EXCISION LEFT BUTTOCK  MASS;  Surgeon: Adin Hector, MD;  Location: [facility];  Service: General;  Laterality: Left;   NOSE SURGERY  2007   REMOVAL HARDWARE L4-L5/  BILATERAL LAMINECTOMY L2 - L5 AND FUSION  06-12-2011   SHOULDER OPEN ROTATOR CUFF REPAIR Left 05/05/2014   Procedure: OPEN ACROMIONECTOMY AND OPEN REPAIR OF ROTATOR CUFF, TISSUEMEND GRAFT WITH ANCHOR ;  Surgeon: Tobi Bastos, MD;  Location: [facility];  Service: Orthopedics;  Laterality: Left;   SPINE SURGERY     x6   TONSILLECTOMY      Current Medications: Current Meds  Medication Sig   acetaminophen (TYLENOL) 500 MG tablet Take 1,000 mg by mouth every 6 (six) hours as needed for moderate pain.    atorvastatin (LIPITOR) 20 MG tablet Take 1 tablet (20 mg total) by mouth daily.   B Complex Vitamins (B COMPLEX PO) Take 1 tablet by mouth daily as needed.   cloNIDine (CATAPRES) 0.1 MG tablet Take 1 tablet (0.1 mg total) by mouth 3 (three) times daily.   diphenhydrAMINE (BENADRYL) 25 MG tablet Take 25 mg by mouth 2 (two) times daily as needed for allergies.   estradiol (ESTRACE) 0.1 MG/GM vaginal cream Place 1 Applicatorful vaginally 2 (two) times a week.   fluticasone (FLONASE) 50 MCG/ACT nasal spray Place 1 spray into both nostrils daily.   gabapentin (NEURONTIN) 600 MG tablet Take 600-1,200 mg by mouth 2 (two) times daily. Take '600mg'$  in the AM and 1,'200mg'$  in the PM   hydrALAZINE (APRESOLINE) 50 MG tablet Take 50 mg by mouth 3 (three) times daily.   hydrochlorothiazide (HYDRODIURIL) 12.5 MG tablet Take 12.5 mg by mouth daily.   levothyroxine (SYNTHROID) 25 MCG tablet Take 25 mcg  by mouth every morning.   Menthol, Topical Analgesic, (ICY HOT BACK EX) Apply 1 application topically 2 (two) times daily as needed (pain).   methocarbamol (ROBAXIN) 500 MG tablet Take 500 mg by mouth 4 (four) times daily as needed for muscle spasms.   metoprolol (LOPRESSOR) 50 MG tablet Take 1 tablet (50 mg total) by mouth 2 (two) times daily.   metoprolol tartrate (LOPRESSOR) 100 MG tablet Take 1 tablet (100 mg total) by mouth once for 1 dose. Two hours prior to Cardiac CT  NIFEdipine (PROCARDIA) 10 MG capsule Take 10 mg by mouth 3 (three) times daily.   pantoprazole (PROTONIX) 40 MG tablet Take 80 mg by mouth daily.    PARoxetine (PAXIL) 30 MG tablet Take 30 mg by mouth daily.   potassium chloride SA (K-DUR,KLOR-CON) 20 MEQ tablet Take 20 mEq by mouth daily.   vitamin E 180 MG (400 UNITS) capsule Take 400 Units by mouth daily as needed.     Allergies:   Latex, Crestor [rosuvastatin calcium], Penicillins, Tetanus toxoids, and Tomato   Social History   Socioeconomic History   Marital status: Married    Spouse name: Not on file   Number of children: 1   Years of education: Not on file   Highest education level: Master's degree (e.g., MA, MS, MEng, MEd, MSW, MBA)  Occupational History   Not on file  Tobacco Use   Smoking status: Former    Types: Cigarettes    Quit date: 07/10/1969    Years since quitting: 53.3   Smokeless tobacco: Never  Vaping Use   Vaping Use: Never used  Substance and Sexual Activity   Alcohol use: Not Currently    Comment: rare, social   Drug use: No   Sexual activity: Not on file  Other Topics Concern   Not on file  Social History Narrative   Lives at home with husband    Right handed   Caffeine: max 2 cups coffee per day but not does not drink daily.   Social Determinants of Health   Financial Resource Strain: Not on file  Food Insecurity: Not on file  Transportation Needs: Not on file  Physical Activity: Not on file  Stress: Not on file   Social Connections: Not on file     Family History: The patient's family history includes Aneurysm in her maternal grandmother; Cancer in her daughter, mother, and sister; Colon cancer in her father; Colon polyps in her father; Diabetes in her mother; Hypertension in her brother, brother, father, mother, and sister; Kidney disease in her brother and mother; Liver cancer in her brother and brother; Osteoarthritis in her mother; Stroke in her sister. There is no history of Migraines or Headache.  ROS:   Please see the history of present illness.     All other systems reviewed and are negative.  EKGs/Labs/Other Studies Reviewed:    The following studies were reviewed today:   EKG:  EKG is  ordered today.  The ekg ordered today demonstrates normal sinus rhythm  Recent Labs: 09/29/2022: ALT 16; BUN 11; Creatinine, Ser 1.01; Hemoglobin 13.5; Magnesium 2.0; Platelets 313; Potassium 3.5; Sodium 135  Recent Lipid Panel    Component Value Date/Time   CHOL 165 07/03/2016 0627   TRIG 148 07/27/2016 0415   HDL 82 07/03/2016 0627   CHOLHDL 2.0 07/03/2016 0627   VLDL 8 07/03/2016 0627   LDLCALC 75 07/03/2016 0627     Risk Assessment/Calculations:      Physical Exam:    VS:  BP 128/74 (BP Location: Left Arm, Patient Position: Sitting, Cuff Size: Large)   Pulse 68   Ht '5\' 5"'$  (1.651 m)   Wt 282 lb 6.4 oz (128.1 kg)   SpO2 94%   BMI 46.99 kg/m     Wt Readings from Last 3 Encounters:  10/25/22 282 lb 6.4 oz (128.1 kg)  09/30/22 283 lb 15.2 oz (128.8 kg)  03/02/22 284 lb (128.8 kg)     GEN:  Well nourished, well developed in no acute distress  HEENT: Normal NECK: No JVD; No carotid bruits CARDIAC: RRR, no murmurs, rubs, gallops RESPIRATORY:  Clear to auscultation without rales, wheezing or rhonchi  ABDOMEN: Soft, non-tender, non-distended MUSCULOSKELETAL:  trace edema; No deformity  SKIN: Warm and dry NEUROLOGIC:  Alert and oriented x 3 PSYCHIATRIC:  Normal affect    ASSESSMENT:    1. Chest pain, unspecified type   2. Grade II diastolic dysfunction   3. Dyspnea, unspecified type   4. Primary hypertension   5. Morbid obesity ([place])    PLAN:    In order of problems listed above:  Chest pain, previous workup with echo and Myoview unrevealing.  Get coronary CT, obesity will likely hinder the image quality. HFpEF, endorses shortness of breath, start Lasix 20 mg daily.  Morbid obesity, untreated sleep apnea likely contributing to symptoms of shortness of breath. Hypertension, BP  controlled.  Continue current HCTZ, hydralazine, Lopressor. Morbid obesity, low-calorie diet, weight loss advised.  Follow-up in 6-12 months.   Medication Adjustments/Labs and Tests Ordered: Current medicines are reviewed at length with the patient today.  Concerns regarding medicines are outlined above.  Orders Placed This Encounter  Procedures   CT CORONARY MORPH W/CTA COR W/SCORE W/CA W/CM &/OR WO/CM   Basic Metabolic Panel (BMET)   EKG 12-Lead    Meds ordered this encounter  Medications   metoprolol tartrate (LOPRESSOR) 100 MG tablet    Sig: Take 1 tablet (100 mg total) by mouth once for 1 dose. Two hours prior to Cardiac CT    Dispense:  1 tablet    Refill:  0   furosemide (LASIX) 20 MG tablet    Sig: Take 1 tablet (20 mg total) by mouth daily. PLEASE SCHEDULE OFFICE VISIT FOR FURTHER REFILLS. THANK YOU!    Dispense:  90 tablet    Refill:  3     Patient Instructions  Medication Instructions:   Your physician recommends that you continue on your current medications as directed. Please refer to the Current Medication list given to you today.  *If you need a refill on your cardiac medications before your next appointment, please call your pharmacy*   Lab Work:  Your physician recommends you go to the medical mall for lab work  If you have labs (blood work) drawn today and your tests are completely normal, you will receive your results only  by: MyChart Message (if you have MyChart) OR A paper copy in the mail If you have any lab test that is abnormal or we need to change your treatment, we will call you to review the results.   Testing/Procedures:    Your cardiac CT will be scheduled on February 19th, 2024 @ 1:00 pm  [facility] [place], [place] [address] [phone]   If scheduled at [facility] or [facility], please arrive 15 mins early for check-in and test prep.   Please follow these instructions carefully (unless otherwise directed):  On the Night Before the Test: Be sure to Drink plenty of water. Do not consume any caffeinated/decaffeinated beverages or chocolate 12 hours prior to your test. Do not take any antihistamines 12 hours prior to your test.  On the Day of the Test: Drink plenty of water until 1 hour prior to the test. Do not eat any food 1 hour prior to test. You may take your regular medications prior to the test.  Take metoprolol (Lopressor) two hours prior to test. HOLD Furosemide/Hydrochlorothiazide morning of  the test. FEMALES- please wear underwire-free bra if available, avoid dresses & tight clothing      After the Test: Drink plenty of water. After receiving IV contrast, you may experience a mild flushed feeling. This is normal. On occasion, you may experience a mild rash up to 24 hours after the test. This is not dangerous. If this occurs, you can take Benadryl 25 mg and increase your fluid intake. If you experience trouble breathing, this can be serious. If it is severe call 911 IMMEDIATELY. If it is mild, please call our office.  For scheduling needs, including cancellations and rescheduling, please call [place], [phone].    Follow-Up: At [facility], you and your health needs are our priority.  As part of our continuing mission to provide you with exceptional heart care, we  have created designated Provider Care Teams.  These Care Teams include your primary Cardiologist (physician) and Advanced Practice Providers (APPs -  Physician Assistants and Nurse Practitioners) who all work together to provide you with the care you need, when you need it.  We recommend signing up for the patient portal called "MyChart".  Sign up information is provided on this After Visit Summary.  MyChart is used to connect with patients for Virtual Visits (Telemedicine).  Patients are able to view lab/test results, encounter notes, upcoming appointments, etc.  Non-urgent messages can be sent to your provider as well.   To learn more about what you can do with MyChart, go to [email or web address].    Your next appointment:   6 week(s)  Provider:   You may see Kate Sable, MD or one of the following Advanced Practice Providers on your designated Care Team:   Murray Hodgkins, NP Christell Faith, PA-C Cadence Kathlen Mody, PA-C Gerrie Nordmann, NP    Signed, Kate Sable, MD  10/25/2022 12:20 PM    [place]

## 2022-10-25 NOTE — Patient Instructions (Signed)
Medication Instructions:   Your physician recommends that you continue on your current medications as directed. Please refer to the Current Medication list given to you today.  *If you need a refill on your cardiac medications before your next appointment, please call your pharmacy*   Lab Work:  Your physician recommends you go to the medical mall for lab work  If you have labs (blood work) drawn today and your tests are completely normal, you will receive your results only by: MyChart Message (if you have MyChart) OR A paper copy in the mail If you have any lab test that is abnormal or we need to change your treatment, we will call you to review the results.   Testing/Procedures:    Your cardiac CT will be scheduled on February 19th, 2024 @ 1:00 pm  [facility] [place], [place] [address] [phone]   If scheduled at [facility] or [facility], please arrive 15 mins early for check-in and test prep.   Please follow these instructions carefully (unless otherwise directed):  On the Night Before the Test: Be sure to Drink plenty of water. Do not consume any caffeinated/decaffeinated beverages or chocolate 12 hours prior to your test. Do not take any antihistamines 12 hours prior to your test.  On the Day of the Test: Drink plenty of water until 1 hour prior to the test. Do not eat any food 1 hour prior to test. You may take your regular medications prior to the test.  Take metoprolol (Lopressor) two hours prior to test. HOLD Furosemide/Hydrochlorothiazide morning of the test. FEMALES- please wear underwire-free bra if available, avoid dresses & tight clothing      After the Test: Drink plenty of water. After receiving IV contrast, you may experience a mild flushed feeling. This is normal. On occasion, you may experience a mild rash up to 24 hours after the test. This is not  dangerous. If this occurs, you can take Benadryl 25 mg and increase your fluid intake. If you experience trouble breathing, this can be serious. If it is severe call 911 IMMEDIATELY. If it is mild, please call our office.  For scheduling needs, including cancellations and rescheduling, please call [place], [phone].    Follow-Up: At [facility], you and your health needs are our priority.  As part of our continuing mission to provide you with exceptional heart care, we have created designated Provider Care Teams.  These Care Teams include your primary Cardiologist (physician) and Advanced Practice Providers (APPs -  Physician Assistants and Nurse Practitioners) who all work together to provide you with the care you need, when you need it.  We recommend signing up for the patient portal called "MyChart".  Sign up information is provided on this After Visit Summary.  MyChart is used to connect with patients for Virtual Visits (Telemedicine).  Patients are able to view lab/test results, encounter notes, upcoming appointments, etc.  Non-urgent messages can be sent to your provider as well.   To learn more about what you can do with MyChart, go to [email or web address].    Your next appointment:   6 week(s)  Provider:   You may see Kate Sable, MD or one of the following Advanced Practice Providers on your designated Care Team:   Murray Hodgkins, NP Christell Faith, PA-C Cadence Kathlen Mody, PA-C Gerrie Nordmann, NP

## 2022-11-09 ENCOUNTER — Telehealth (HOSPITAL_COMMUNITY): Payer: Self-pay | Admitting: *Deleted

## 2022-11-09 NOTE — Telephone Encounter (Signed)
Reaching out to patient to offer assistance regarding upcoming cardiac imaging study; pt verbalizes understanding of appt date/time, parking situation and where to check in, pre-test NPO status and medications ordered, and verified current allergies; name and call back number provided for further questions should they arise  Amy Clement RN Navigator Cardiac [place] [facility] [phone] office [phone] cell  Patient to hold her daily metoprolol and take 15m metoprolol tartrate two hours prior to her cardiac CT scan.

## 2022-11-12 ENCOUNTER — Ambulatory Visit
Admission: RE | Admit: 2022-11-12 | Discharge: 2022-11-12 | Disposition: A | Discharge status: Home / Self Care | Payer: Medicare Other | Source: Ambulatory Visit | Attending: Cardiology | Admitting: Cardiology

## 2022-11-12 ENCOUNTER — Ambulatory Visit: Admission: RE | Admit: 2022-11-12 | Payer: Medicare Other | Source: Ambulatory Visit

## 2022-11-12 DIAGNOSIS — R918 Other nonspecific abnormal finding of lung field: Secondary | ICD-10-CM | POA: Diagnosis not present

## 2022-11-12 DIAGNOSIS — R911 Solitary pulmonary nodule: Secondary | ICD-10-CM | POA: Insufficient documentation

## 2022-11-12 DIAGNOSIS — I2510 Atherosclerotic heart disease of native coronary artery without angina pectoris: Secondary | ICD-10-CM | POA: Diagnosis not present

## 2022-11-12 DIAGNOSIS — R079 Chest pain, unspecified: Secondary | ICD-10-CM

## 2022-11-12 MED ORDER — DILTIAZEM HCL 25 MG/5ML IV SOLN
10.0000 mg | Freq: Once | INTRAVENOUS | Status: AC
  Administered 2022-11-12: 10 mg via INTRAVENOUS

## 2022-11-12 MED ORDER — NITROGLYCERIN 0.4 MG SL SUBL
0.8000 mg | SUBLINGUAL_TABLET | Freq: Once | SUBLINGUAL | Status: AC
  Administered 2022-11-12: 0.8 mg via SUBLINGUAL

## 2022-11-12 MED ORDER — METOPROLOL TARTRATE 5 MG/5ML IV SOLN
10.0000 mg | Freq: Once | INTRAVENOUS | Status: AC
  Administered 2022-11-12: 10 mg via INTRAVENOUS

## 2022-11-12 MED ORDER — IOHEXOL 350 MG/ML SOLN
100.0000 mL | Freq: Once | INTRAVENOUS | Status: AC | PRN
  Administered 2022-11-12: 100 mL via INTRAVENOUS

## 2022-11-12 NOTE — Progress Notes (Signed)
Patient tolerated procedure well. Ambulate w/o difficulty. Denies any lightheadedness or being dizzy. Pt denies any pain at this time. Sitting in chair, pt is encouraged to drink additional water throughout the day and reason explained to patient. Patient verbalized understanding and all questions answered. ABC intact. No further needs at this time. Discharge from procedure area w/o issues.  

## 2022-11-13 ENCOUNTER — Telehealth: Payer: Self-pay

## 2022-11-13 NOTE — Telephone Encounter (Signed)
Amy [name] called back wanting to review results again.   Amy [name] reports that for the last 2 weeks Amy [name] has been having pain that starts in her right ankle and through the right thigh.   Amy [name] report that her right leg has also turning colors  Amy [name] reports that currently her right thigh is cold but her lower leg is warm.  Amy [name] report that she does not want to see Dr. Garen Lah anymore due to the experience with her recent visit.   Amy [name] is looking for recommendations or further evaluations.

## 2022-11-15 ENCOUNTER — Telehealth: Payer: Self-pay | Admitting: Cardiology

## 2022-11-15 NOTE — Telephone Encounter (Signed)
I called and spoke with the patient to follow up on her visit with Amy [name] and the symptoms she reported to Amy [name], Amy [name] regarding her lower extremities.  Per Amy [name], she did not have any issues with Amy [name] and appreciated his care very much.  The patient advised she did not want to see "that other doctor" that evaluated her legs before. She felt he "cut her off." After reviewing the patient's chart further, she had a lower extremity venous reflux [place] ordered under Amy [name] (Vascular & Vein Specialist) on 08/01/21.  I inquired if the symptoms the patient was reporting to Amy [name] had been present since her visit with Amy [name].  Per the patient, they have been "going on for a while."  She has had symptoms of her right lower leg being warm to touch and her right upper leg being cold to touch. However, she has developed intermittent pain to the right leg in the last 2 weeks.  I have advised the patient that we can see her back in the office to re-evaluate her lower extremities and see any additional testing is needed to possibly look at her arterial circulation.  I have again confirmed with the patient that she would like to keep Amy [name] as her physician and she advised she absolutely would like to stay under his care.  I have scheduled her to come in on Thursday 11/22/22 at 10:40 am to see Amy [name].  She was previously scheduled to see Amy Faith, PA on 12/06/22 and is aware I will leave this in place for the time being as she was going to confirm with her husband that he could bring her to the appointment on 11/22/22 as she is not driving currently due to her legs.  The patient voices understanding of her follow up with Amy [name] and will call back if she is unable to keep this appointment.  She was very appreciative of the call back.   Will forward to Amy [name] as an Amy [name].   To note, the patient has a separate encounter open  for the over read of the non-cardiac portion of her Cardiac CT from today. This is regarding a new 8 mm pulmonary nodule in the posterior right lower lobe.  I have not discussed this with the patient as the provider has not signed off on this or provided further recommendations.  This can be discussed with the patient at her follow up on 11/22/22 with Amy [name].

## 2022-11-15 NOTE — Telephone Encounter (Signed)
See 11/13/22 phone note.

## 2022-11-15 NOTE — Telephone Encounter (Signed)
[facility] called in with an addendum to the coronary ct. Results are below and in epic.   IMPRESSION: New 8 mm pulmonary nodule in the posterior right lower lobe. Non-contrast chest CT at 6-12 months is recommended. If the nodule is stable at time of repeat CT, then future CT at 18-24 months (from today's scan) is considered optional for low-risk patients, but is recommended for high-risk patients. This recommendation follows the consensus statement: Guidelines for Management of Incidental Pulmonary Nodules Detected on CT Images: From the Fleischner Society 2017; Radiology 2017; 284:228-243.

## 2022-11-15 NOTE — Telephone Encounter (Signed)
[name] with [facility] is calling to report critical CT results.

## 2022-11-16 ENCOUNTER — Other Ambulatory Visit: Payer: Self-pay

## 2022-11-16 DIAGNOSIS — R911 Solitary pulmonary nodule: Secondary | ICD-10-CM

## 2022-11-22 ENCOUNTER — Encounter: Payer: Self-pay | Admitting: Cardiology

## 2022-11-22 ENCOUNTER — Ambulatory Visit: Payer: Medicare Other | Attending: Cardiology | Admitting: Cardiology

## 2022-11-22 VITALS — BP 144/78 | HR 68 | Ht 65.0 in | Wt 282.0 lb

## 2022-11-22 DIAGNOSIS — I5032 Chronic diastolic (congestive) heart failure: Secondary | ICD-10-CM | POA: Diagnosis not present

## 2022-11-22 DIAGNOSIS — I2510 Atherosclerotic heart disease of native coronary artery without angina pectoris: Secondary | ICD-10-CM | POA: Insufficient documentation

## 2022-11-22 DIAGNOSIS — I10 Essential (primary) hypertension: Secondary | ICD-10-CM | POA: Insufficient documentation

## 2022-11-22 MED ORDER — FUROSEMIDE 40 MG PO TABS: 40.0000 mg | ORAL_TABLET | Freq: Every day | ORAL | 3 refills | Status: DC

## 2022-11-22 NOTE — Patient Instructions (Signed)
Medication Instructions:   INCREASE Lasix - take one tablet ( 40 mg) by mouth daily. - If you have any 20 mg tablets you may take two tablets daily until you run out of the 20 mg tablets.   *If you need a refill on your cardiac medications before your next appointment, please call your pharmacy*   Lab Work:  Your physician recommends that you return for lab work in: 10 days (week of March 11th - March 15th) at the medical mall. You will need to be fasting. No appt is needed. Hours are M-F 7AM- 6 PM.  If you have labs (blood work) drawn today and your tests are completely normal, you will receive your results only by: [place] (if you have MyChart) OR A paper copy in the mail If you have any lab test that is abnormal or we need to change your treatment, we will call you to review the results.   Testing/Procedures:  None Ordered   Follow-Up: At [facility], you and your health needs are our priority.  As part of our continuing mission to provide you with exceptional heart care, we have created designated Provider Care Teams.  These Care Teams include your primary Cardiologist (physician) and Advanced Practice Providers (APPs -  Physician Assistants and Nurse Practitioners) who all work together to provide you with the care you need, when you need it.  We recommend signing up for the patient portal called "MyChart".  Sign up information is provided on this After Visit Summary.  MyChart is used to connect with patients for Virtual Visits (Telemedicine).  Patients are able to view lab/test results, encounter notes, upcoming appointments, etc.  Non-urgent messages can be sent to your provider as well.   To learn more about what you can do with MyChart, go to [email or web address].    Your next appointment:   3 month(s)  Provider:   You may see Kate Sable, MD or one of the following Advanced Practice Providers on your designated Care Team:   Murray Hodgkins,  NP Christell Faith, PA-C Cadence Kathlen Mody, PA-C Gerrie Nordmann, NP

## 2022-11-22 NOTE — Progress Notes (Signed)
Cardiology Office Note:    Date:  11/22/2022   ID:  Amy [name], DOB 09-03-1946, MRN [id number]  PCP:  Pa, [facility]  Cardiologist:  Kate Sable, MD  Advanced Practice Provider:  No care team member to display Electrophysiologist:  None       Referring MD: [facility]*   Chief Complaint  Patient presents with   Follow-up    Testing F/U, having pains in R leg (ankle to hip)    History of Present Illness:    Amy [name] is a 77 y.o. 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    Previously seen for symptoms of chest pain, coronary CTA was ordered showing nonobstructive proximal LAD disease about 25% stenosis.  Complains of shortness of breath with exertion, still has leg edema, right knee pain secondary to bursitis.  Taking Lasix 20 mg daily as prescribed.   Prior notes Coronary CTA 10/2022, mild LAD stenosis 25% Lexiscan Myoview 01/2021 no evidence for ischemia, low risk study. Echo 123XX123 normal systolic function, EF 60 to 65%, Echo 06/2016 showed normal systolic function, EF 60 to 65%.  Impaired relaxation.  Past Medical History:  Diagnosis Date   Abdominal pain of unknown etiology 02/05/2016   Anxiety    Arthritis    ddd- RA   Asthma    "sleeping asthma"   Chronic back pain    lumbar steroid injection's   Complication of anesthesia    Hard to wake up. Pt sts is took 3 days.   Constipation    Depression    Dysphonia    intermittent "voice changes"   Esophageal dysmotility    Fibromyalgia    Fibromyalgia    GERD (gastroesophageal reflux disease)    Glaucoma    Headache    High cholesterol    ? pt states her doctor told her to continue eating vegetables & fruit   History of DVT (deep vein thrombosis) 1989   LEFT UPPER ARM   History  of hiatal hernia    History of MRSA infection 2011   Hypertension    Hypothyroidism    Irregular heart rate    "years ago"   Low iron    Lumbago    Mild obstructive sleep apnea    per study 02-07-2006 - no cpap   Neuropathy    feet   Pelvic pain in female    Peripheral vascular disease ([place])    "poor circulation"   SBO (small bowel obstruction) ([place]) 06/2016   Seasonal allergies    Short of breath on exertion    Urinary frequency    Vaginal atrophy    Weakness    both hands and feet    Past Surgical History:  Procedure Laterality Date   abdominal adhesions removed     ABDOMINAL HYSTERECTOMY  1978   BACK SURGERY     BUNIONECTOMY Left 2008   CATARACT EXTRACTION Bilateral 2017   COLON SURGERY     COLONOSCOPY     CYSTO WITH HYDRODISTENSION N/A 07/12/2015   Procedure: CYSTOSCOPY/HYDRODISTENSION;  Surgeon: Bjorn Loser, MD;  Location[facility];  Service: Urology;  Laterality: N/A;   DILATION AND CURETTAGE OF UTERUS     ESOPHAGEAL MANOMETRY N/A 11/22/2014   Procedure: ESOPHAGEAL MANOMETRY (EM);  Surgeon: Winfield Cunas., MD;  Location: [facility];  Service: Endoscopy;  Laterality: N/A;   ESOPHAGOGASTRODUODENOSCOPY N/A 07/15/2013   Procedure: ESOPHAGOGASTRODUODENOSCOPY (EGD);  Surgeon: Winfield Cunas., MD;  Location: [facility];  Service: Endoscopy;  Laterality: N/A;  need xray   EXPLORATORY LAPAROTOMY     EYE SURGERY Bilateral    cataract surgery with lens implants   HARDWARE REMOVAL N/A 01/02/2016   Procedure: Exploration of Lumbar Fusion,Removal of hardware Lumbar One-Two ;Redo Posterior Lumbar Fusion Lumbar One-Two;  Surgeon: Kary Kos, MD;  Location: [facility];  Service: Neurosurgery;  Laterality: N/A;   ingrown toenail removal     LAPAROSCOPIC CHOLECYSTECTOMY  01-11-2006   LAPAROSCOPY N/A 07/06/2016   Procedure: LAPAROSCOPY DIAGNOSTIC EMERGENT TO OPEN;  Surgeon: Greer Pickerel, MD;  Location: [place];  Service: General;  Laterality: N/A;    LAPAROTOMY N/A 07/06/2016   Procedure: EXPLORATORY LAPAROTOMY LYSIS OF ADHESIONS FOR 3 HOURS;  Surgeon: Greer Pickerel, MD;  Location: [place];  Service: General;  Laterality: N/A;   lipoma removal     LUMBAR FUSION  2014   L4 -- L5   MASS EXCISION Left 02/20/2013   Procedure: EXCISION LEFT BUTTOCK  MASS;  Surgeon: Adin Hector, MD;  Location: [facility];  Service: General;  Laterality: Left;   NOSE SURGERY  2007   REMOVAL HARDWARE L4-L5/  BILATERAL LAMINECTOMY L2 - L5 AND FUSION  06-12-2011   SHOULDER OPEN ROTATOR CUFF REPAIR Left 05/05/2014   Procedure: OPEN ACROMIONECTOMY AND OPEN REPAIR OF ROTATOR CUFF, TISSUEMEND GRAFT WITH ANCHOR ;  Surgeon: Tobi Bastos, MD;  Location: [facility];  Service: Orthopedics;  Laterality: Left;   SPINE SURGERY     x6   TONSILLECTOMY      Current Medications: Current Meds  Medication Sig   acetaminophen (TYLENOL) 500 MG tablet Take 1,000 mg by mouth every 6 (six) hours as needed for moderate pain.    atorvastatin (LIPITOR) 20 MG tablet Take 1 tablet (20 mg total) by mouth daily.   B Complex Vitamins (B COMPLEX PO) Take 1 tablet by mouth daily as needed.   cloNIDine (CATAPRES) 0.1 MG tablet Take 1 tablet (0.1 mg total) by mouth 3 (three) times daily.   diphenhydrAMINE (BENADRYL) 25 MG tablet Take 25 mg by mouth 2 (two) times daily as needed for allergies.   estradiol (ESTRACE) 0.1 MG/GM vaginal cream Place 1 Applicatorful vaginally 2 (two) times a week.   fluticasone (FLONASE) 50 MCG/ACT nasal spray Place 1 spray into both nostrils daily.   gabapentin (NEURONTIN) 600 MG tablet Take 600-1,200 mg by mouth 2 (two) times daily. Take '600mg'$  in the AM and 1,'200mg'$  in the PM   hydrALAZINE (APRESOLINE) 50 MG tablet Take 50 mg by mouth 3 (three) times daily.   hydrochlorothiazide (HYDRODIURIL) 12.5 MG tablet Take 12.5 mg by mouth daily.   levothyroxine (SYNTHROID) 25 MCG tablet Take 25 mcg by mouth every morning.   Menthol, Topical Analgesic, (ICY HOT BACK EX) Apply 1  application topically 2 (two) times daily as needed (pain).   methocarbamol (ROBAXIN) 500 MG tablet Take 500 mg by mouth 4 (four) times daily as needed for muscle spasms.   metoprolol (LOPRESSOR) 50 MG tablet Take 1 tablet (50 mg total) by mouth 2 (two) times daily.   NIFEdipine (PROCARDIA) 10 MG capsule Take 10 mg by mouth 3 (three) times daily.   pantoprazole (PROTONIX) 40 MG tablet Take 80 mg by mouth daily.    PARoxetine (PAXIL) 30 MG tablet Take 30 mg by mouth daily.   potassium  chloride SA (K-DUR,KLOR-CON) 20 MEQ tablet Take 20 mEq by mouth daily.   vitamin E 180 MG (400 UNITS) capsule Take 400 Units by mouth daily as needed.   [DISCONTINUED] furosemide (LASIX) 20 MG tablet Take 1 tablet (20 mg total) by mouth daily. PLEASE SCHEDULE OFFICE VISIT FOR FURTHER REFILLS. THANK YOU!     Allergies:   Latex, Crestor [rosuvastatin calcium], Penicillins, Tetanus toxoids, and Tomato   Social History   Socioeconomic History   Marital status: Married    Spouse name: Not on file   Number of children: 1   Years of education: Not on file   Highest education level: Master's degree (e.g., MA, MS, MEng, MEd, MSW, MBA)  Occupational History   Not on file  Tobacco Use   Smoking status: Former    Types: Cigarettes    Quit date: 07/10/1969    Years since quitting: 53.4   Smokeless tobacco: Never  Vaping Use   Vaping Use: Never used  Substance and Sexual Activity   Alcohol use: Not Currently    Comment: rare, social   Drug use: No   Sexual activity: Not on file  Other Topics Concern   Not on file  Social History Narrative   Lives at home with husband    Right handed   Caffeine: max 2 cups coffee per day but not does not drink daily.   Social Determinants of Health   Financial Resource Strain: Not on file  Food Insecurity: Not on file  Transportation Needs: Not on file  Physical Activity: Not on file  Stress: Not on file  Social Connections: Not on file     Family History: The  patient's family history includes Aneurysm in her maternal grandmother; Cancer in her daughter, mother, and sister; Colon cancer in her father; Colon polyps in her father; Diabetes in her mother; Hypertension in her brother, brother, father, mother, and sister; Kidney disease in her brother and mother; Liver cancer in her brother and brother; Osteoarthritis in her mother; Stroke in her sister. There is no history of Migraines or Headache.  ROS:   Please see the history of present illness.     All other systems reviewed and are negative.  EKGs/Labs/Other Studies Reviewed:    The following studies were reviewed today:   EKG:  EKG not ordered today.   Recent Labs: 09/29/2022: ALT 16; Hemoglobin 13.5; Magnesium 2.0; Platelets 313 10/25/2022: BUN 18; Creatinine, Ser 1.01; Potassium 4.4; Sodium 137  Recent Lipid Panel    Component Value Date/Time   CHOL 165 07/03/2016 0627   TRIG 148 07/27/2016 0415   HDL 82 07/03/2016 0627   CHOLHDL 2.0 07/03/2016 0627   VLDL 8 07/03/2016 0627   LDLCALC 75 07/03/2016 0627     Risk Assessment/Calculations:      Physical Exam:    VS:  BP (!) 144/78 (BP Location: Right Arm, Patient Position: Sitting, Cuff Size: Large)   Pulse 68   Ht '5\' 5"'$  (1.651 m)   Wt 282 lb (127.9 kg)   SpO2 96%   BMI 46.93 kg/m     Wt Readings from Last 3 Encounters:  11/22/22 282 lb (127.9 kg)  10/25/22 282 lb 6.4 oz (128.1 kg)  09/30/22 283 lb 15.2 oz (128.8 kg)     GEN:  Well nourished, well developed in no acute distress HEENT: Normal NECK: No JVD; No carotid bruits CARDIAC: RRR, no murmurs, rubs, gallops RESPIRATORY:  Clear to auscultation without rales, wheezing or rhonchi  ABDOMEN: Soft,  non-tender, non-distended MUSCULOSKELETAL:  trace edema; No deformity  SKIN: Warm and dry NEUROLOGIC:  Alert and oriented x 3 PSYCHIATRIC:  Normal affect   ASSESSMENT:    1. Coronary artery disease involving native coronary artery, unspecified whether angina present,  unspecified whether native or transplanted heart   2. Chronic heart failure with preserved ejection fraction ([place])   3. Primary hypertension   4. Morbid obesity ([place])    PLAN:    In order of problems listed above:  Chest pain, coronary CT with calcium score 109, mild proximal LAD stenosis 25%.  LDL at goal, continue Lipitor. HFpEF still short of breath.  Morbid obesity, untreated sleep apnea  contributing.  Increase Lasix to 40 mg daily.  Check BMP in 10 days. Hypertension, BP elevated today, usually controlled.  Continue current HCTZ, hydralazine, Lopressor. Morbid obesity, low-calorie diet, weight loss advised.  Refer to dietitian.  Follow-up in 3 months.   Medication Adjustments/Labs and Tests Ordered: Current medicines are reviewed at length with the patient today.  Concerns regarding medicines are outlined above.  Orders Placed This Encounter  Procedures   Basic Metabolic Panel (BMET)   Ambulatory referral to Nutrition and Diabetic Education    Meds ordered this encounter  Medications   furosemide (LASIX) 40 MG tablet    Sig: Take 1 tablet (40 mg total) by mouth daily.    Dispense:  90 tablet    Refill:  3     Patient Instructions  Medication Instructions:   INCREASE Lasix - take one tablet ( 40 mg) by mouth daily. - If you have any 20 mg tablets you may take two tablets daily until you run out of the 20 mg tablets.   *If you need a refill on your cardiac medications before your next appointment, please call your pharmacy*   Lab Work:  Your physician recommends that you return for lab work in: 10 days (week of March 11th - March 15th) at the medical mall. You will need to be fasting. No appt is needed. Hours are M-F 7AM- 6 PM.  If you have labs (blood work) drawn today and your tests are completely normal, you will receive your results only by: [place] (if you have MyChart) OR A paper copy in the mail If you have any lab test that is abnormal or we need  to change your treatment, we will call you to review the results.   Testing/Procedures:  None Ordered   Follow-Up: At [facility], you and your health needs are our priority.  As part of our continuing mission to provide you with exceptional heart care, we have created designated Provider Care Teams.  These Care Teams include your primary Cardiologist (physician) and Advanced Practice Providers (APPs -  Physician Assistants and Nurse Practitioners) who all work together to provide you with the care you need, when you need it.  We recommend signing up for the patient portal called "MyChart".  Sign up information is provided on this After Visit Summary.  MyChart is used to connect with patients for Virtual Visits (Telemedicine).  Patients are able to view lab/test results, encounter notes, upcoming appointments, etc.  Non-urgent messages can be sent to your provider as well.   To learn more about what you can do with MyChart, go to [email or web address].    Your next appointment:   3 month(s)  Provider:   You may see Kate Sable, MD or one of the following Advanced Practice Providers on your designated Care Team:  Murray Hodgkins, NP Christell Faith, PA-C Cadence Kathlen Mody, PA-C Gerrie Nordmann, NP   Signed, Kate Sable, MD  11/22/2022 12:10 PM    [place]

## 2022-11-28 ENCOUNTER — Ambulatory Visit (INDEPENDENT_AMBULATORY_CARE_PROVIDER_SITE_OTHER): Payer: Medicare Other | Admitting: Student in an Organized Health Care Education/Training Program

## 2022-11-28 ENCOUNTER — Encounter: Payer: Self-pay | Admitting: Student in an Organized Health Care Education/Training Program

## 2022-11-28 VITALS — BP 150/82 | HR 72 | Temp 97.6°F | Ht 65.0 in | Wt 283.2 lb

## 2022-11-28 DIAGNOSIS — R911 Solitary pulmonary nodule: Secondary | ICD-10-CM | POA: Diagnosis not present

## 2022-11-29 NOTE — Progress Notes (Signed)
Synopsis: Referred in for pulmonary nodule by Kate Sable, MD  Assessment & Plan:   1. Lung nodule  Presenting for the evaluation of a pulmonary nodule noted on a coronary CT from February of 2024. I have reviewed her previous imaging studies, including her thoracic spine CT's, a PET/CT from 2022, and an abdominal CT from 2022. She does have findings of chronic scarring in the LLL, as well as a stable appearing ground glass nodule in the anterior RLL. The nodule noted in the RLL is new when compared to these previous imaging studies.  I discussed with Ms. Sartwell that the differential for said findings includes infectious, inflammatory, and malignant etiologies. It is a small nodule and we agreed to repeat imaging in 6 months. I will order a chest CT and see the patient for follow up after her CT has been performed.  - CT CHEST WO CONTRAST; Future   Return in about 6 months (around 05/31/2023).  I spent 60 minutes caring for this patient today, including preparing to see the patient, obtaining a medical history , reviewing a separately obtained history, performing a medically appropriate examination and/or evaluation, counseling and educating the patient/family/caregiver, ordering medications, tests, or procedures, documenting clinical information in the electronic health record, and independently interpreting results (not separately reported/billed) and communicating results to the patient/family/caregiver  Armando Reichert, MD [facility] 11/29/2022 9:15 AM    End of visit medications:  No orders of the defined types were placed in this encounter.    Current Outpatient Medications:    acetaminophen (TYLENOL) 500 MG tablet, Take 1,000 mg by mouth every 6 (six) hours as needed for moderate pain. , Disp: , Rfl:    atorvastatin (LIPITOR) 20 MG tablet, Take 1 tablet (20 mg total) by mouth daily., Disp: 30 tablet, Rfl: 3   B Complex Vitamins (B COMPLEX PO),  Take 1 tablet by mouth daily as needed., Disp: , Rfl:    cloNIDine (CATAPRES) 0.1 MG tablet, Take 1 tablet (0.1 mg total) by mouth 3 (three) times daily., Disp: 60 tablet, Rfl: 11   diphenhydrAMINE (BENADRYL) 25 MG tablet, Take 25 mg by mouth 2 (two) times daily as needed for allergies., Disp: , Rfl:    estradiol (ESTRACE) 0.1 MG/GM vaginal cream, Place 1 Applicatorful vaginally 2 (two) times a week., Disp: 42.5 g, Rfl: 12   fluticasone (FLONASE) 50 MCG/ACT nasal spray, Place 1 spray into both nostrils daily., Disp: , Rfl: 2   gabapentin (NEURONTIN) 600 MG tablet, Take 600-1,200 mg by mouth 2 (two) times daily. Take '600mg'$  in the AM and 1,'200mg'$  in the PM, Disp: , Rfl:    hydrALAZINE (APRESOLINE) 50 MG tablet, Take 50 mg by mouth 3 (three) times daily., Disp: , Rfl:    hydrochlorothiazide (HYDRODIURIL) 12.5 MG tablet, Take 12.5 mg by mouth daily., Disp: , Rfl:    levothyroxine (SYNTHROID) 25 MCG tablet, Take 25 mcg by mouth every morning., Disp: , Rfl:    Menthol, Topical Analgesic, (ICY HOT BACK EX), Apply 1 application topically 2 (two) times daily as needed (pain)., Disp: , Rfl:    methocarbamol (ROBAXIN) 500 MG tablet, Take 500 mg by mouth 4 (four) times daily as needed for muscle spasms., Disp: , Rfl:    metoprolol (LOPRESSOR) 50 MG tablet, Take 1 tablet (50 mg total) by mouth 2 (two) times daily., Disp: , Rfl:    NIFEdipine (PROCARDIA) 10 MG capsule, Take 10 mg by mouth 3 (three) times daily., Disp: , Rfl:  pantoprazole (PROTONIX) 40 MG tablet, Take 80 mg by mouth daily. , Disp: , Rfl: 0   PARoxetine (PAXIL) 30 MG tablet, Take 30 mg by mouth daily., Disp: , Rfl:    potassium chloride SA (K-DUR,KLOR-CON) 20 MEQ tablet, Take 20 mEq by mouth daily., Disp: , Rfl:    vitamin E 180 MG (400 UNITS) capsule, Take 400 Units by mouth daily as needed., Disp: , Rfl:    furosemide (LASIX) 40 MG tablet, Take 1 tablet (40 mg total) by mouth daily., Disp: 90 tablet, Rfl: 3   metoprolol tartrate (LOPRESSOR)  100 MG tablet, Take 1 tablet (100 mg total) by mouth once for 1 dose. Two hours prior to Cardiac CT, Disp: 1 tablet, Rfl: 0   traMADol (ULTRAM) 50 MG tablet, Take 50 mg by mouth every 6 (six) hours as needed for moderate pain or severe pain. (Patient not taking: Reported on 10/25/2022), Disp: , Rfl:    Subjective:   PATIENT ID: Amy [name] GENDER: female DOB: [birth year], MRN: [id number]  Chief Complaint  Patient presents with   pulmonary consult    CT 11/12/2022- SOB with exertion, wheezing and dry cough.     HPI  [name] is a pleasant 77 year old female with a past medical history of HFpEF, HTN, HLD, OSA and obesity who presents to clinic for the evaluation of a pulmonary nodule.  The patient was in her usual state of health and has followed with her primary care providers as well as her cardiologist. She was seen by her cardiologist for the complaint of chest pain for which a coronary CT was obtained. The CT was noted to have a RLL nodule for which she is referred to pulmonology.  Patient does not report any change in respiratory symptoms. She feels fatigued with activity and long walks, and attributes her limitation to her aches as well as obesity. She does not have any increased cough, sputum production, wheezing, chest tightness, or other respiratory symptoms. She tells me that she was told in the past that she had a nodule in her lung that was followed by her doctors.  She is a former Pharmacist, hospital, and worked in Education officer, environmental. She smoked briefly in the 70's, with 2 pack years of smoking history. She otherwise denies any exposures.  Ancillary information including prior medications, full medical/surgical/family/social histories, and PFTs (when available) are listed below and have been reviewed.   Review of Systems  Constitutional:  Negative for chills, fever, malaise/fatigue and weight loss.  Respiratory:  Negative for cough, hemoptysis, sputum production and  shortness of breath.   Cardiovascular:  Negative for chest pain.  Skin:  Negative for rash.     Objective:   Vitals:   11/28/22 1427  BP: (!) 150/82  Pulse: 72  Temp: 97.6 F (36.4 C)  TempSrc: Temporal  SpO2: 97%  Weight: 283 lb 3.2 oz (128.5 kg)  Height: '5\' 5"'$  (1.651 m)   97% on RA  BMI Readings from Last 3 Encounters:  11/28/22 47.13 kg/m  11/22/22 46.93 kg/m  10/25/22 46.99 kg/m   Wt Readings from Last 3 Encounters:  11/28/22 283 lb 3.2 oz (128.5 kg)  11/22/22 282 lb (127.9 kg)  10/25/22 282 lb 6.4 oz (128.1 kg)    Physical Exam Constitutional:      Appearance: She is obese. She is not ill-appearing.  HENT:     Head: Normocephalic.     Mouth/Throat:     Mouth: Mucous membranes are moist.  Cardiovascular:  Rate and Rhythm: Normal rate and regular rhythm.     Pulses: Normal pulses.     Heart sounds: Normal heart sounds.  Pulmonary:     Effort: Pulmonary effort is normal.     Breath sounds: Normal breath sounds. No wheezing or rales.  Neurological:     General: No focal deficit present.     Mental Status: She is alert.    Ancillary Information    Past Medical History:  Diagnosis Date   Abdominal pain of unknown etiology 02/05/2016   Anxiety    Arthritis    ddd- RA   Asthma    "sleeping asthma"   Chronic back pain    lumbar steroid injection's   Complication of anesthesia    Hard to wake up. Pt sts is took 3 days.   Constipation    Depression    Dysphonia    intermittent "voice changes"   Esophageal dysmotility    Fibromyalgia    Fibromyalgia    GERD (gastroesophageal reflux disease)    Glaucoma    Headache    High cholesterol    ? pt states her doctor told her to continue eating vegetables & fruit   History of DVT (deep vein thrombosis) 1989   LEFT UPPER ARM   History of hiatal hernia    History of MRSA infection 2011   Hypertension    Hypothyroidism    Irregular heart rate    "years ago"   Low iron    Lumbago    Mild  obstructive sleep apnea    per study 02-07-2006 - no cpap   Neuropathy    feet   Pelvic pain in female    Peripheral vascular disease ([place])    "poor circulation"   SBO (small bowel obstruction) ([place]) 06/2016   Seasonal allergies    Short of breath on exertion    Urinary frequency    Vaginal atrophy    Weakness    both hands and feet     Family History  Problem Relation Age of Onset   Hypertension Mother    Cancer Mother        cervix   Kidney disease Mother    Osteoarthritis Mother    Diabetes Mother    Hypertension Father    Colon cancer Father    Colon polyps Father    Hypertension Sister    Cancer Sister        polyps   Stroke Sister    Kidney disease Brother    Hypertension Brother    Liver cancer Brother    Hypertension Brother    Liver cancer Brother    Aneurysm Maternal Grandmother    Cancer Daughter        leukemia   Migraines Neg Hx    Headache Neg Hx      Past Surgical History:  Procedure Laterality Date   abdominal adhesions removed     ABDOMINAL HYSTERECTOMY  1978   BACK SURGERY     BUNIONECTOMY Left 2008   CATARACT EXTRACTION Bilateral 2017   COLON SURGERY     COLONOSCOPY     CYSTO WITH HYDRODISTENSION N/A 07/12/2015   Procedure: CYSTOSCOPY/HYDRODISTENSION;  Surgeon: Bjorn Loser, MD;  Location: [facility];  Service: Urology;  Laterality: N/A;   DILATION AND CURETTAGE OF UTERUS     ESOPHAGEAL MANOMETRY N/A 11/22/2014   Procedure: ESOPHAGEAL MANOMETRY (EM);  Surgeon: Winfield Cunas., MD;  Location: [facility];  Service: Endoscopy;  Laterality:  N/A;   ESOPHAGOGASTRODUODENOSCOPY N/A 07/15/2013   Procedure: ESOPHAGOGASTRODUODENOSCOPY (EGD);  Surgeon: Winfield Cunas., MD;  Location: [facility];  Service: Endoscopy;  Laterality: N/A;  need xray   EXPLORATORY LAPAROTOMY     EYE SURGERY Bilateral    cataract surgery with lens implants   HARDWARE REMOVAL N/A 01/02/2016   Procedure: Exploration of Lumbar Fusion,Removal of  hardware Lumbar One-Two ;Redo Posterior Lumbar Fusion Lumbar One-Two;  Surgeon: Kary Kos, MD;  Location: [facility];  Service: Neurosurgery;  Laterality: N/A;   ingrown toenail removal     LAPAROSCOPIC CHOLECYSTECTOMY  01-11-2006   LAPAROSCOPY N/A 07/06/2016   Procedure: LAPAROSCOPY DIAGNOSTIC EMERGENT TO OPEN;  Surgeon: Greer Pickerel, MD;  Location: [facility];  Service: General;  Laterality: N/A;   LAPAROTOMY N/A 07/06/2016   Procedure: EXPLORATORY LAPAROTOMY LYSIS OF ADHESIONS FOR 3 HOURS;  Surgeon: Greer Pickerel, MD;  Location: [facility];  Service: General;  Laterality: N/A;   lipoma removal     LUMBAR FUSION  2014   L4 -- L5   MASS EXCISION Left 02/20/2013   Procedure: EXCISION LEFT BUTTOCK  MASS;  Surgeon: Adin Hector, MD;  Location: [facility];  Service: General;  Laterality: Left;   NOSE SURGERY  2007   REMOVAL HARDWARE L4-L5/  BILATERAL LAMINECTOMY L2 - L5 AND FUSION  06-12-2011   SHOULDER OPEN ROTATOR CUFF REPAIR Left 05/05/2014   Procedure: OPEN ACROMIONECTOMY AND OPEN REPAIR OF ROTATOR CUFF, TISSUEMEND GRAFT WITH ANCHOR ;  Surgeon: Tobi Bastos, MD;  Location: [facility];  Service: Orthopedics;  Laterality: Left;   SPINE SURGERY     x6   TONSILLECTOMY      Social History   Socioeconomic History   Marital status: Married    Spouse name: Not on file   Number of children: 1   Years of education: Not on file   Highest education level: Master's degree (e.g., MA, MS, MEng, MEd, MSW, MBA)  Occupational History   Not on file  Tobacco Use   Smoking status: Former    Packs/day: 0.25    Years: 5.00    Total pack years: 1.25    Types: Cigarettes    Quit date: 07/10/1969    Years since quitting: 53.4   Smokeless tobacco: Never  Vaping Use   Vaping Use: Never used  Substance and Sexual Activity   Alcohol use: Not Currently    Comment: rare, social   Drug use: No   Sexual activity: Not on file  Other Topics Concern   Not on file  Social History Narrative   Lives at home with  husband    Right handed   Caffeine: max 2 cups coffee per day but not does not drink daily.   Social Determinants of Health   Financial Resource Strain: Not on file  Food Insecurity: Not on file  Transportation Needs: Not on file  Physical Activity: Not on file  Stress: Not on file  Social Connections: Not on file  Intimate Partner Violence: Not on file     Allergies  Allergen Reactions   Latex Other (See Comments)    Sores, blisters - reaction to gloves   Crestor [Rosuvastatin Calcium]     Myalgias   Penicillins Other (See Comments)    UNSPECIFIED REACTION  "Has taken amoxicillin and ampicillin with no reaction" Has patient had a PCN reaction causing immediate rash, facial/tongue/throat swelling, SOB or lightheadedness with hypotension: Unknown Has patient had a PCN reaction causing severe rash involving  mucus membranes or skin necrosis: Unknown Has patient had a PCN reaction that required hospitalization: Unknown Has patient had a PCN reaction occurring within the last 10 years: No    Tetanus Toxoids Rash   Tomato Rash     CBC    Component Value Date/Time   WBC 4.9 09/29/2022 1745   RBC 4.36 09/29/2022 1745   HGB 13.5 09/29/2022 1745   HGB 13.9 07/28/2020 1426   HCT 40.0 09/29/2022 1745   HCT 41.8 07/28/2020 1426   PLT 313 09/29/2022 1745   PLT 325 07/28/2020 1426   MCV 91.7 09/29/2022 1745   MCV 91 07/28/2020 1426   MCH 31.0 09/29/2022 1745   MCHC 33.8 09/29/2022 1745   RDW 14.5 09/29/2022 1745   RDW 13.9 07/28/2020 1426   LYMPHSABS 1.7 09/29/2022 1745   LYMPHSABS 1.2 07/28/2020 1426   MONOABS 0.6 09/29/2022 1745   EOSABS 0.1 09/29/2022 1745   EOSABS 0.1 07/28/2020 1426   BASOSABS 0.0 09/29/2022 1745   BASOSABS 0.0 07/28/2020 1426    Pulmonary Functions Testing Results:     No data to display          Outpatient Medications Prior to Visit  Medication Sig Dispense Refill   acetaminophen (TYLENOL) 500 MG tablet Take 1,000 mg by mouth every 6 (six)  hours as needed for moderate pain.      atorvastatin (LIPITOR) 20 MG tablet Take 1 tablet (20 mg total) by mouth daily. 30 tablet 3   B Complex Vitamins (B COMPLEX PO) Take 1 tablet by mouth daily as needed.     cloNIDine (CATAPRES) 0.1 MG tablet Take 1 tablet (0.1 mg total) by mouth 3 (three) times daily. 60 tablet 11   diphenhydrAMINE (BENADRYL) 25 MG tablet Take 25 mg by mouth 2 (two) times daily as needed for allergies.     estradiol (ESTRACE) 0.1 MG/GM vaginal cream Place 1 Applicatorful vaginally 2 (two) times a week. 42.5 g 12   fluticasone (FLONASE) 50 MCG/ACT nasal spray Place 1 spray into both nostrils daily.  2   gabapentin (NEURONTIN) 600 MG tablet Take 600-1,200 mg by mouth 2 (two) times daily. Take '600mg'$  in the AM and 1,'200mg'$  in the PM     hydrALAZINE (APRESOLINE) 50 MG tablet Take 50 mg by mouth 3 (three) times daily.     hydrochlorothiazide (HYDRODIURIL) 12.5 MG tablet Take 12.5 mg by mouth daily.     levothyroxine (SYNTHROID) 25 MCG tablet Take 25 mcg by mouth every morning.     Menthol, Topical Analgesic, (ICY HOT BACK EX) Apply 1 application topically 2 (two) times daily as needed (pain).     methocarbamol (ROBAXIN) 500 MG tablet Take 500 mg by mouth 4 (four) times daily as needed for muscle spasms.     metoprolol (LOPRESSOR) 50 MG tablet Take 1 tablet (50 mg total) by mouth 2 (two) times daily.     NIFEdipine (PROCARDIA) 10 MG capsule Take 10 mg by mouth 3 (three) times daily.     pantoprazole (PROTONIX) 40 MG tablet Take 80 mg by mouth daily.   0   PARoxetine (PAXIL) 30 MG tablet Take 30 mg by mouth daily.     potassium chloride SA (K-DUR,KLOR-CON) 20 MEQ tablet Take 20 mEq by mouth daily.     vitamin E 180 MG (400 UNITS) capsule Take 400 Units by mouth daily as needed.     furosemide (LASIX) 40 MG tablet Take 1 tablet (40 mg total) by mouth daily. 90 tablet 3  metoprolol tartrate (LOPRESSOR) 100 MG tablet Take 1 tablet (100 mg total) by mouth once for 1 dose. Two hours  prior to Cardiac CT 1 tablet 0   traMADol (ULTRAM) 50 MG tablet Take 50 mg by mouth every 6 (six) hours as needed for moderate pain or severe pain. (Patient not taking: Reported on 10/25/2022)     No facility-administered medications prior to visit.

## 2022-12-05 ENCOUNTER — Other Ambulatory Visit: Payer: Self-pay | Admitting: Cardiology

## 2022-12-05 DIAGNOSIS — I10 Essential (primary) hypertension: Secondary | ICD-10-CM | POA: Diagnosis not present

## 2022-12-05 DIAGNOSIS — I5032 Chronic diastolic (congestive) heart failure: Secondary | ICD-10-CM

## 2022-12-05 DIAGNOSIS — R0609 Other forms of dyspnea: Secondary | ICD-10-CM

## 2022-12-06 ENCOUNTER — Ambulatory Visit: Payer: Medicare Other | Admitting: Physician Assistant

## 2022-12-06 LAB — BASIC METABOLIC PANEL
BUN/Creatinine Ratio: 15 (ref 12–28)
BUN: 18 mg/dL (ref 8–27)
CO2: 29 mmol/L (ref 20–29)
Calcium: 9.8 mg/dL (ref 8.7–10.3)
Chloride: 98 mmol/L (ref 96–106)
Creatinine, Ser: 1.2 mg/dL — ABNORMAL HIGH (ref 0.57–1.00)
Glucose: 90 mg/dL (ref 70–99)
Potassium: 4.6 mmol/L (ref 3.5–5.2)
Sodium: 141 mmol/L (ref 134–144)
eGFR: 47 mL/min/{1.73_m2} — ABNORMAL LOW (ref >59)

## 2022-12-06 NOTE — Addendum Note (Signed)
Addended by: Janan Ridge on: 12/06/2022 04:15 PM   Modules accepted: Orders

## 2022-12-06 NOTE — Progress Notes (Signed)
Spoke with patient. Reviewed results.  Patient requesting the lab orders be put in for [facility].

## 2022-12-28 ENCOUNTER — Encounter: Payer: Medicare Other | Admitting: Skilled Nursing Facility1

## 2023-01-01 DIAGNOSIS — Z01419 Encounter for gynecological examination (general) (routine) without abnormal findings: Secondary | ICD-10-CM | POA: Diagnosis not present

## 2023-01-27 NOTE — Progress Notes (Unsigned)
Patient: Amy [name]  Service Category: E/M  Provider: Oswaldo Done, MD  DOB: [birth year]  DOS: 01/28/2023  Referring Provider: Donalee Citrin, MD  MRN: [id number]  Setting: Ambulatory outpatient  PCP: [facility]  Type: New Patient  Specialty: Interventional Pain Management    Location: Office  Delivery: Face-to-face     Primary Reason(s) for Visit: Encounter for initial evaluation of one or more chronic problems (new to examiner) potentially causing chronic pain, and posing a threat to normal musculoskeletal function. (Level of risk: High) CC: No chief complaint on file.  HPI  Amy [name] is a 77 y.o. year old, female patient, who comes for the first time to our practice referred by Donalee Citrin, MD for our initial evaluation of her chronic pain. She has Obstructive sleep apnea; HTN (hypertension); Allergic rhinitis; SLEEPINESS; DYSPNEA; ANGINA, HX OF; Morbid obesity (HCC); Lipoma of buttock s/p excision 02/20/2013; Depression; Hypokalemia; GERD (gastroesophageal reflux disease); Spinal stenosis of lumbar region; Bladder spasm; Complete rotator cuff tear of left shoulder; Neuropathic pain of both legs; Constipation; Pseudoarthrosis of lumbar spine; Long term current use of opiate analgesic; Long term prescription opiate use; Opiate use; Encounter for therapeutic drug level monitoring; Encounter for pain management planning; Chronic pain; Disturbance of skin sensation; Chronic low back pain (Location of Primary Source of Pain) (Bilateral) (R>L); Chronic hip pain (Location of Secondary source of pain) (Bilateral) (R>L); 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); Chronic shoulder pain (Location of Tertiary source of pain) (Bilateral) (R>L); 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); Chronic knee pain (Bilateral) (R>L); 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 Today she comes in for evaluation of her No chief complaint on file.  Pain Assessment: Location:     Radiating:   Onset:   Duration:   Quality:   Severity:  /10 (subjective, self-reported pain score)  Effect on ADL:   Timing:   Modifying factors:   BP:    HR:    Onset and Duration: {Hx; Onset and Duration:210120511} Cause of pain: {Hx; Cause:210120521} Severity: {Pain Severity:210120502} Timing: {Symptoms; Timing:210120501} Aggravating Factors: {Causes; Aggravating pain factors:210120507} Alleviating Factors: {Causes; Alleviating Factors:210120500} Associated Problems: {Hx; Associated problems:210120515} Quality of Pain: {Hx; Symptom quality or Descriptor:210120531} Previous Examinations or Tests: {Hx; Previous examinations or test:210120529} Previous Treatments: {Hx; Previous Treatment:210120503}  Amy [name] is being evaluated for possible interventional pain management therapies for the treatment of her chronic pain.   ***  Amy [name] has been informed that this initial visit was an evaluation only.  On the follow up appointment I will go over the results, including ordered tests and available interventional therapies. At that time she will have the opportunity to decide whether to proceed with offered therapies or not. In the event that Amy [name] prefers avoiding interventional options, this will conclude our involvement in the case.  Medication management recommendations may be provided upon request.  Historic Controlled Substance Pharmacotherapy Review  PMP and  historical list of controlled substances: ***  Most recently prescribed opioid analgesics:   *** MME/day: *** mg/day  Historical Monitoring: The patient  reports no history of drug use. List of  prior UDS Testing: No results found for: "MDMA", "COCAINSCRNUR", "PCPSCRNUR", "PCPQUANT", "CANNABQUANT", "THCU", "ETH", "CBDTHCR", "D8THCCBX", "D9THCCBX" Historical Background Evaluation: [place] PMP: PDMP reviewed during this encounter. Review of the past 54-months conducted.             PMP NARX Score Report:  Narcotic: *** Sedative: *** Stimulant: *** [employer] Department of public safety, offender search: Engineer, mining Information) Non-contributory Risk Assessment Profile: Aberrant behavior: None observed or detected today Risk factors for fatal opioid overdose: None identified today PMP NARX Overdose Risk Score: *** Fatal overdose hazard ratio (HR): Calculation deferred Non-fatal overdose hazard ratio (HR): Calculation deferred Risk of opioid abuse or dependence: 0.7-3.0% with doses ? 36 MME/day and 6.1-26% with doses ? 120 MME/day. Substance use disorder (SUD) risk level: See below Personal History of Substance Abuse (SUD-Substance use disorder):  Alcohol:    Illegal Drugs:    Rx Drugs:    ORT Risk Level calculation:    ORT Scoring interpretation table:  Score <3 = Low Risk for SUD  Score between 4-7 = Moderate Risk for SUD  Score >8 = High Risk for Opioid Abuse   PHQ-2 Depression Scale:  Total score:    PHQ-2 Scoring interpretation table: (Score and probability of major depressive disorder)  Score 0 = No depression  Score 1 = 15.4% Probability  Score 2 = 21.1% Probability  Score 3 = 38.4% Probability  Score 4 = 45.5% Probability  Score 5 = 56.4% Probability  Score 6 = 78.6% Probability   PHQ-9 Depression Scale:  Total score:    PHQ-9 Scoring interpretation table:  Score 0-4 = No depression  Score 5-9 = Mild depression  Score 10-14 = Moderate depression  Score 15-19 = Moderately severe  depression  Score 20-27 = Severe depression (2.4 times higher risk of SUD and 2.89 times higher risk of overuse)   Pharmacologic Plan: As per protocol, I have not taken over any controlled substance management, pending the results of ordered tests and/or consults.            Initial impression: Pending review of available data and ordered tests.  Meds   Current Outpatient Medications:    acetaminophen (TYLENOL) 500 MG tablet, Take 1,000 mg by mouth every 6 (six) hours as needed for moderate pain. , Disp: , Rfl:    atorvastatin (LIPITOR) 20 MG tablet, Take 1 tablet (20 mg total) by mouth daily., Disp: 30 tablet, Rfl: 3   B Complex Vitamins (B COMPLEX PO), Take 1 tablet by mouth daily as needed., Disp: , Rfl:    cloNIDine (CATAPRES) 0.1 MG tablet, Take 1 tablet (0.1 mg total) by mouth 3 (three) times daily., Disp: 60 tablet, Rfl: 11   diphenhydrAMINE (BENADRYL) 25 MG tablet, Take 25 mg by mouth 2 (two) times daily as needed for allergies., Disp: , Rfl:    estradiol (ESTRACE) 0.1 MG/GM vaginal cream, Place 1 Applicatorful vaginally 2 (two) times a week., Disp: 42.5 g, Rfl: 12   fluticasone (FLONASE) 50 MCG/ACT nasal spray, Place 1 spray into both nostrils daily., Disp: , Rfl: 2   furosemide (LASIX) 40 MG tablet, Take 1 tablet (40 mg total) by mouth daily., Disp: 90 tablet, Rfl: 3   gabapentin (NEURONTIN) 600 MG tablet, Take 600-1,200 mg by mouth 2 (two) times daily. Take 600mg  in the AM and 1,200mg  in the PM, Disp: , Rfl:    hydrALAZINE (APRESOLINE) 50 MG tablet, Take 50 mg by mouth 3 (three) times daily., Disp: , Rfl:    hydrochlorothiazide (HYDRODIURIL)  12.5 MG tablet, Take 12.5 mg by mouth daily., Disp: , Rfl:    levothyroxine (SYNTHROID) 25 MCG tablet, Take 25 mcg by mouth every morning., Disp: , Rfl:    Menthol, Topical Analgesic, (ICY HOT BACK EX), Apply 1 application topically 2 (two) times daily as needed (pain)., Disp: , Rfl:    methocarbamol (ROBAXIN) 500 MG tablet, Take 500 mg by mouth  4 (four) times daily as needed for muscle spasms., Disp: , Rfl:    metoprolol (LOPRESSOR) 50 MG tablet, Take 1 tablet (50 mg total) by mouth 2 (two) times daily., Disp: , Rfl:    metoprolol tartrate (LOPRESSOR) 100 MG tablet, Take 1 tablet (100 mg total) by mouth once for 1 dose. Two hours prior to Cardiac CT, Disp: 1 tablet, Rfl: 0   NIFEdipine (PROCARDIA) 10 MG capsule, Take 10 mg by mouth 3 (three) times daily., Disp: , Rfl:    pantoprazole (PROTONIX) 40 MG tablet, Take 80 mg by mouth daily. , Disp: , Rfl: 0   PARoxetine (PAXIL) 30 MG tablet, Take 30 mg by mouth daily., Disp: , Rfl:    potassium chloride SA (K-DUR,KLOR-CON) 20 MEQ tablet, Take 20 mEq by mouth daily., Disp: , Rfl:    traMADol (ULTRAM) 50 MG tablet, Take 50 mg by mouth every 6 (six) hours as needed for moderate pain or severe pain. (Patient not taking: Reported on 10/25/2022), Disp: , Rfl:    vitamin E 180 MG (400 UNITS) capsule, Take 400 Units by mouth daily as needed., Disp: , Rfl:   Imaging Review  Cervical Imaging: Cervical MR wo contrast: Results for orders placed during the hospital encounter of 04/18/19  MR CERVICAL SPINE WO CONTRAST  Narrative CLINICAL DATA:  Neck pain with bilateral shoulder pain and numbness in both arms for several years  EXAM: MRI CERVICAL SPINE WITHOUT CONTRAST  TECHNIQUE: Multiplanar, multisequence MR imaging of the cervical spine was performed. No intravenous contrast was administered.  COMPARISON:  03/31/2018  FINDINGS: Alignment: Normal  Vertebrae: No fracture, evidence of discitis, or bone lesion.  Cord: Normal signal and morphology.  Posterior Fossa, vertebral arteries, paraspinal tissues: T2 hyperintensity in the pons, chronic and best attributed to chronic small vessel ischemia  Disc levels:  Good disc height and hydration. No significant facet degeneration. No cord or foraminal impingement.  IMPRESSION: 1. Unremarkable cervical MRI. Degenerative changes are less  than usually seen at patient age and there is no impingement. 2. Chronic small vessel ischemic change in the pons.   Electronically Signed By: Marnee Spring M.D. On: 04/18/2019 08:37  Cervical MR wo contrast: No valid procedures specified. Cervical MR w/wo contrast: No results found for this or any previous visit.  Cervical MR w contrast: No results found for this or any previous visit.  Cervical CT wo contrast: Results for orders placed during the hospital encounter of 08/26/20  CT CERVICAL SPINE WO CONTRAST  Narrative CLINICAL DATA:  Ataxia, C-spine trauma.  EXAM: CT CERVICAL SPINE WITHOUT CONTRAST  TECHNIQUE: Multidetector CT imaging of the cervical spine was performed without intravenous contrast. Multiplanar CT image reconstructions were also generated.  COMPARISON:  CT of the cervical spine September 24, 2019.  FINDINGS: Alignment: Normal.  Skull base and vertebrae: No acute fracture. No primary bone lesion or focal pathologic process.  Soft tissues and spinal canal: No prevertebral fluid or swelling. No visible canal hematoma.  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  Mild facet degenerative changes, more pronounced at C3-4 and C6-7 on the  left.  No spinal canal or neural foraminal stenosis at any level.  Upper chest: Negative.  Other: None.  IMPRESSION: 1. No acute fracture or subluxation of the cervical spine. 2. Mild facet degenerative changes. No spinal canal or neural foraminal stenosis at any level.   Electronically Signed By: Baldemar Lenis M.D. On: 08/27/2020 11:41  Cervical CT w/wo contrast: No results found for this or any previous visit.  Cervical CT w/wo contrast: No results found for this or any previous visit.  Cervical CT w contrast: No results found for this or any previous visit.  Cervical CT outside: No results found for this or any previous visit.  Cervical DG 1 view: No results found for this or  any previous visit.  Cervical DG 2-3 views: No results found for this or any previous visit.  Cervical DG F/E views: No results found for this or any previous visit.  Cervical DG 2-3 clearing views: No results found for this or any previous visit.  Cervical DG Bending/F/E views: No results found for this or any previous visit.  Cervical DG complete: Results for orders placed during the hospital encounter of 06/27/16  DG Cervical Spine Complete  Narrative CLINICAL DATA:  Chronic neck pain, no known injury, initial encounter  EXAM: CERVICAL SPINE - COMPLETE 4+ VIEW  COMPARISON:  None.  FINDINGS: Seven cervical segments are well visualized. Vertebral body height is well maintained. No prevertebral soft tissue changes are seen. Mild facet hypertrophic changes are noted. No significant neural foraminal narrowing is seen. The odontoid is within normal limits.  IMPRESSION: Mild degenerative change without acute abnormality.   Electronically Signed By: Alcide Clever M.D. On: 06/28/2016 08:38  Cervical DG Myelogram views: No results found for this or any previous visit.  Cervical DG Myelogram views: No results found for this or any previous visit.  Cervical Discogram views: No results found for this or any previous visit.   Shoulder Imaging: Shoulder-R MR w contrast: No results found for this or any previous visit.  Shoulder-L MR w contrast: No results found for this or any previous visit.  Shoulder-R MR w/wo contrast: No results found for this or any previous visit.  Shoulder-L MR w/wo contrast: No results found for this or any previous visit.  Shoulder-R MR wo contrast: No results found for this or any previous visit.  Shoulder-L MR wo contrast: No results found for this or any previous visit.  Shoulder-R CT w contrast: No results found for this or any previous visit.  Shoulder-L CT w contrast: No results found for this or any previous visit.  Shoulder-R CT w/wo  contrast: No results found for this or any previous visit.  Shoulder-L CT w/wo contrast: No results found for this or any previous visit.  Shoulder-R CT wo contrast: No results found for this or any previous visit.  Shoulder-L CT wo contrast: No results found for this or any previous visit.  Shoulder-R DG Arthrogram: No results found for this or any previous visit.  Shoulder-L DG Arthrogram: No results found for this or any previous visit.  Shoulder-R DG 1 view: No results found for this or any previous visit.  Shoulder-L DG 1 view: No results found for this or any previous visit.  Shoulder-R DG: Results for orders placed during the hospital encounter of 07/06/21  DG Shoulder Right  Narrative CLINICAL DATA:  Right shoulder pain, fell  EXAM: RIGHT SHOULDER - 2+ VIEW  COMPARISON:  06/27/2016  FINDINGS: Internal rotation, external  rotation, transscapular, and axillary views of the right shoulder are obtained. No acute fracture. Mild superior subluxation of the distal clavicle relative to the acromion process on the transscapular view, which could reflect mild shoulder separation. There is mild glenohumeral osteoarthritis. There is prominent hypertrophy of the acromioclavicular joint, with significant spurring of the undersurface of the acromion process. Soft tissues are unremarkable. Right chest is clear.  IMPRESSION: 1. Prominent osteoarthritis about the right shoulder. No acute displaced fracture. 2. Mild separation of the right acromioclavicular joint, only well visualized on the transscapular view.   Electronically Signed By: Sharlet Salina M.D. On: 07/06/2021 17:07  Shoulder-L DG: Results for orders placed during the hospital encounter of 06/27/16  DG Shoulder Left  Narrative CLINICAL DATA:  Chronic pain.  No reported injury.  EXAM: LEFT SHOULDER - 2+ VIEW  COMPARISON:  08/16/2009.  FINDINGS: No acute bony or joint abnormality identified. No evidence  of fracture or dislocation. Mild acromioclavicular and glenohumeral degenerative change.  IMPRESSION: No acute or focal abnormality. Mild degenerative changes left shoulder .   Electronically Signed By: Maisie Fus  Register On: 06/28/2016 06:47   Thoracic Imaging: Thoracic MR wo contrast: Results for orders placed during the hospital encounter of 10/23/21  MR THORACIC SPINE WO CONTRAST  Narrative CLINICAL DATA:  Back pain radiating to the right and left side pain in both legs  EXAM: MRI THORACIC SPINE WITHOUT CONTRAST  TECHNIQUE: Multiplanar, multisequence MR imaging of the thoracic spine was performed. No intravenous contrast was administered.  COMPARISON:  Thoracic spine MRI 04/12/2021, CT thoracic spine 01/19/2020  FINDINGS: Alignment:  Normal.  Vertebrae: Patient is status post posterior instrumented fusion extending from T10 through the lumbar spine. The lower hardware is not imaged. Vertebral body heights are preserved. There is no evidence of acute injury.  There is mild T1 hypointensity in the right aspect of the T5 vertebral body, similar to the prior study, though previously STIR hyperintensity has decreased. There is mild degenerative endplate marrow signal abnormality at T8-T9 and T9-T10, similar to the prior study.  Cord: Cord is suboptimally assessed from T11 and below due to significant susceptibility from the adjacent hardware. The upper thoracic cord is normal in signal and morphology.  Paraspinal and other soft tissues: Unremarkable.  Disc levels:  There is mild multilevel disc desiccation and narrowing in the thoracic spine above the surgical levels, most advanced at T8-T9 and T9-T10, similar to the prior study.  There are small disc protrusions at T4-T5, T6-T7, T7-T8, T8-T9, and T9-T10. There is prominent dorsal epidural fat at T4-T5 through T9-T10, most pronounced at T7 where it measures up to 6 mm in thickness. This results in up to  moderate spinal canal stenosis at T7-T8, not significantly changed. There is no other significant spinal canal stenosis.  There is multilevel facet arthropathy with moderate to severe bilateral neural foraminal stenosis at T8-T9 and T9-T10, not significantly changed.  IMPRESSION: 1. Postsurgical changes reflecting posterior instrumented fusion extending from T10 into the lumbar spine. The lumbar hardware is not imaged. 2. Mild degenerative endplate marrow signal abnormality at T8-T9 and T9-T10, not significantly changed. 3. Scattered small disc protrusions with prominent dorsal epidural fat resulting in up to moderate spinal canal stenosis at T7-T8, not significantly changed. 4. Moderate to severe bilateral neural foraminal stenosis at T8-T9 and T9-T10, also not significantly changed. 5. No evidence of acute injury in the thoracic spine. 6. Unchanged mild T1 hypointensity in the T5 vertebral body, favored benign given stability and lack of findings  on prior PET-CT.   Electronically Signed By: Lesia Hausen M.D. On: 10/24/2021 13:38  Thoracic MR wo contrast: No valid procedures specified. Thoracic MR w/wo contrast: No results found for this or any previous visit.  Thoracic MR w contrast: No results found for this or any previous visit.  Thoracic CT wo contrast: Results for orders placed during the hospital encounter of 06/22/22  CT THORACIC SPINE WO CONTRAST  Narrative CLINICAL DATA:  Right thoracic pain radiating to the right axilla and anterior chest.  EXAM: CT THORACIC SPINE WITHOUT CONTRAST  TECHNIQUE: Multidetector CT images of the thoracic were obtained using the standard protocol without intravenous contrast.  RADIATION DOSE REDUCTION: This exam was performed according to the departmental dose-optimization program which includes automated exposure control, adjustment of the mA and/or kV according to patient size and/or use of iterative reconstruction  technique.  COMPARISON:  02/22/2022  FINDINGS: Alignment: Exaggerated thoracic kyphosis.  No listhesis.  Vertebrae: Vertebral body sclerosis at T8-9 and T9-10. T10 two lumbar fusion with solid arthrodesis. No loosening of hardware. The T10 screws new approach the intervertebral space.  Paraspinal and other soft tissues: Paravertebral edema at T8-9, also present on MRI 03/23/2022.  Disc levels: Generalized disc space narrowing and spurring which progresses inferiorly towards the surgical fusion, especially severe at T7-8 and T8-9 where there is endplate irregularity and sclerosis with disc collapse. Bilateral facet spurring at T8-9 and T9-10 especially, biforaminal impingement at these levels. Dorsal epidural fat expansion encroaches on the thecal sac from behind.  IMPRESSION: 1. T10 to lumbar fusion with solid arthrodesis. 2. Advanced degeneration of the adjacent levels, especially T8-9 and T9-10 where there is endplate sclerosis and disc collapse. Biforaminal impingement at these levels 3. Notable paravertebral edema at T8-9 which was also seen on prior 02/22/2022, stability suggesting inflammation rather than infection. 4. Midthoracic dorsal epidural lipomatosis encroaching on the thecal sac, better seen by MRI earlier this year.   Electronically Signed By: Tiburcio Pea M.D. On: 06/25/2022 18:44  Thoracic CT w/wo contrast: No results found for this or any previous visit.  Thoracic CT w/wo contrast: No results found for this or any previous visit.  Thoracic CT w contrast: No results found for this or any previous visit.  Thoracic DG 2-3 views: No results found for this or any previous visit.  Thoracic DG 4 views: No results found for this or any previous visit.  Thoracic DG: Results for orders placed during the hospital encounter of 02/10/18  DG Thoracolumabar Spine  Narrative CLINICAL DATA:  77 year old female with a history of revision  spinal fixation.  EXAM: LUMBAR SPINE - 2-3 VIEW; DG C-ARM 61-120 MIN  COMPARISON:  CT 09/25/2017  FINDINGS: Limited intraoperative frontal and lateral view of thoracolumbar spine, with incompletely imaged hardware.  Presuming the L1-L2 level is the level with a disc spacer and associated subsidence, as seen on prior CT, there are now postoperative changes of bilateral pedicle screws at T10, T11, T12.  Surgical sponges project over the posterior elements of L1. Incompletely imaged previous lumbar fixation.  IMPRESSION: Limited intraoperative fluoroscopic spot images of thoracolumbar fixation/revision, with interval bilateral pedicle screws at T10, T11, T12, as above. Please refer to the dictated operative report for full details of intraoperative findings and procedure.   Electronically Signed By: Gilmer Mor D.O. On: 02/10/2018 12:24  Thoracic DG w/swimmers view: No results found for this or any previous visit.  Thoracic DG Myelogram views: No results found for this or any previous visit.  Thoracic DG Myelogram views: No results found for this or any previous visit.   Lumbosacral Imaging: Lumbar MR wo contrast: Results for orders placed during the hospital encounter of 04/12/21  MR LUMBAR SPINE WO CONTRAST  Narrative CLINICAL DATA:  Acute bilateral low back pain without sciatica. Multiple prior spine surgeries.  EXAM: MRI LUMBAR SPINE WITHOUT CONTRAST  TECHNIQUE: Multiplanar, multisequence MR imaging of the lumbar spine was performed. No intravenous contrast was administered.  COMPARISON:  Lumbar spine CT 01/19/2020  FINDINGS: Segmentation:  Standard.  Alignment:  Normal.  Vertebrae: Sequelae of thoracolumbar fusion are again noted with a posterior pedicle screw and rod construct extending from T10-L3 with lack of screws at L1. Interbody cages are again noted at L1-2 and L3-4, and there are anterior plates and screws related to prior L4-5 and L5-S1  anterior fusions. Screws are again noted traversing the facet joints at L5-S1. Within limitations of susceptibility artifact from hardware, no fracture, suspicious marrow lesion, or significant marrow edema is identified in the lumbar spine.  Conus medullaris and cauda equina: Conus extends to approximately the L1 level, partially obscured by artifact.  Paraspinal and other soft tissues: Postoperative changes in the posterior thoracolumbar soft tissues with extensive paraspinal muscle atrophy bilaterally. Small fluid collection in the laminectomy bed at L3-4.  Disc levels:  T12-L1: Posterior fusion.  No disc herniation or stenosis.  L1-2: Discectomy and fusion with limited assessment of the spinal canal and neural foramina due to artifact from the interbody spacers. The prior CT demonstrated asymmetric posterior positioning of the right-sided interbody spacer resulting in mild right neural foraminal stenosis as well as mild left-sided neural foraminal stenosis due to spurring.  L2-3: Posterior fusion. Mild disc bulging and facet hypertrophy without evidence of significant stenosis.  L3-4: Wide posterior decompression, discectomy, and fusion. No stenosis.  L4-5: Posterior decompression and anterior and posterior fusion. No stenosis.  L5-S1: Anterior and posterior fusion with limited assessment of the spinal canal and neural foramina due to artifact although the neural foramina appeared patent on the prior CT.  IMPRESSION: Extensive postsurgical changes throughout the lumbar spine as detailed above. No acute finding or evidence of a compressive stenosis within limitations of artifact.   Electronically Signed By: Sebastian Ache M.D. On: 04/13/2021 12:02  Lumbar MR wo contrast: No valid procedures specified. Lumbar MR w/wo contrast: Results for orders placed during the hospital encounter of 02/24/16  MR Lumbar Spine W Wo Contrast  Narrative CLINICAL DATA:  Bilateral  groin pain. Severe lower extremity weakness. Previous fusion from L1-2 S1  EXAM: MRI LUMBAR SPINE WITHOUT AND WITH CONTRAST  TECHNIQUE: Multiplanar and multiecho pulse sequences of the lumbar spine were obtained without and with intravenous contrast.  CONTRAST:  10mL MULTIHANCE GADOBENATE DIMEGLUMINE 529 MG/ML IV SOLN  COMPARISON:  Radiographs dated 01/30/2016, CT scan dated 01/30/2016 and MRI dated 12/06/2015  FINDINGS: Segmentation:  Normal.  Alignment:  Normal.  Vertebrae:  No acute abnormality.  Previous fusion from L1-2 S1.  Conus medullaris: Extends to the L1 level and appears normal.  Paraspinal and other soft tissues: No acute abnormalities. Fatty atrophy of the posterior paraspinal musculature with scarring in the soft tissues from prior surgery. These are stable since the prior study.  Disc levels: T11-12 and T12-L1:  Normal.  L1-2: Interbody fusion. Interval removal of the right pedicle screw. Small amount of fluid adjacent to the right facet joint which is probably a small seroma. This is not felt to be significant. Scarring around the thecal sac  to the expected degree.  L2-3:  Normal disc.  Solid posterior fusion.  L3-4: Solid interbody and posterior fusion. Slight scarring around the thecal sac with the expected degree of enhancement after contrast administration. No residual or recurrent neural impingement.  L4-5 and L5-S1: Solid anterior and posterior fusion. No appreciable scar tissue around the thecal sac or nerve roots. No neural impingement. No change since the prior study.  IMPRESSION: 1. Interval removal of the right pedicle screw at L1. Small seroma in the posterior soft tissues adjacent to the right facet joint at that level which is felt to be postsurgical in origin. 2. Expected degree of scar tissue and enhancement around the thecal sac and nerve roots at L1-2 and L3-4. 3. No visible neural impingement.   Electronically Signed By:  Francene Boyers M.D. On: 02/24/2016 11:50  Lumbar MR w/wo contrast: No results found for this or any previous visit.  Lumbar MR w contrast: No results found for this or any previous visit.  Lumbar CT wo contrast: Results for orders placed during the hospital encounter of 09/01/21  CT LUMBAR SPINE WO CONTRAST  Narrative CLINICAL DATA:  Evaluate spinal stenosis.  Bilateral hip pain.  EXAM: CT LUMBAR SPINE WITHOUT CONTRAST  TECHNIQUE: Multidetector CT imaging of the lumbar spine was performed without intravenous contrast administration. Multiplanar CT image reconstructions were also generated.  COMPARISON:  CT lumbar spine 01/19/2020  FINDINGS: Segmentation: 5 lumbar segments.  Alignment: Normal  Vertebrae: Negative for fracture or mass. Degenerative change in both SI joints with spurring and gas in the joint.  Surgical hardware: Pedicle screw and rod fusion in the thoracic spine extending to the L3 level. Interbody spacers bilaterally at L1-2. The right-sided spacer is positioned in the ventral epidural space similar to the prior study. This is contributing to subarticular stenosis.  Interbody spacer with solid fusion at L3-4. Solid interbody fusion L4-5 and L5-S1. Anterior plate and screws overlying the L4-5 and L5-S1 disc space in good position. Posterior screws across the L5-S1 facet bilaterally.  Paraspinal and other soft tissues: Negative for paraspinous mass, adenopathy, or fluid collection. Atherosclerotic calcification aorta and iliac arteries without aneurysm.  Disc levels: T12-L1: Negative for stenosis.  L1-2: Posterior decompression. Spinal canal decompressed. Subarticular stenosis on the right due to spurring and posteriorly placed spacer. No change from the prior study.  L2-3: Bilateral facet hypertrophy. Mild spinal stenosis. No interval change  L3-4: Solid posterior bony fusion. Posterior decompression. Negative for stenosis  L4-5: Solid  interbody fusion. Solid posterior fusion. Negative for stenosis  L5-S1: Solid interbody fusion. Solid posterior fusion. Screws across the L5-S1 facet joint bilaterally. Negative for stenosis.  IMPRESSION: Thoracic and lumbar fusion as described above. There is no change from the prior CT of 01/19/2020. Again note is made of posterior displacement of the right L1 spacer, unchanged.   Electronically Signed By: Marlan Palau M.D. On: 09/01/2021 17:01  Lumbar CT w/wo contrast: No results found for this or any previous visit.  Lumbar CT w/wo contrast: No results found for this or any previous visit.  Lumbar CT w contrast: No results found for this or any previous visit.  Lumbar DG 1V: No results found for this or any previous visit.  Lumbar DG 1V (Clearing): No results found for this or any previous visit.  Lumbar DG 2-3V (Clearing): No results found for this or any previous visit.  Lumbar DG 2-3 views: Results for orders placed during the hospital encounter of 09/28/15  DG Lumbar Spine 2-3  Views  Narrative CLINICAL DATA:  L1-2 PLIF  EXAM: DG C-ARM 61-120 MIN; LUMBAR SPINE - 2-3 VIEW  COMPARISON:  09/04/2013  FLUOROSCOPY TIME:  Radiation Exposure Index (as provided by the fluoroscopic device): Not available  If the device does not provide the exposure index:  Fluoroscopy Time:  28 seconds  Number of Acquired Images:  2  FINDINGS: Prior fusion is again noted at L3-4. Pedicle screws have been removed at L4 an new pedicle screws placed at L1 with interbody fusion at L1-2.  IMPRESSION: L1-2 PLIF   Electronically Signed By: Alcide Clever M.D. On: 09/28/2015 13:14  Lumbar DG (Complete) 4+V: No results found for this or any previous visit.        Lumbar DG F/E views: No results found for this or any previous visit.        Lumbar DG Bending views: Results for orders placed during the hospital encounter of 06/27/16  DG Lumbar Spine Complete  W/Bend  Narrative CLINICAL DATA:  Low back pain, no known injury, initial encounter  EXAM: LUMBAR SPINE - COMPLETE WITH BENDING VIEWS  COMPARISON:  05/22/2016  FINDINGS: Multilevel postoperative changes are noted throughout the lumbar spine. The overall appearance of the hardware is stable. Vertebral body height is well maintained. Aortic calcifications are seen. Flexion and extension views show no definitive instability.  IMPRESSION: Multilevel postoperative changes. No instability is seen on flexion and extension. No acute abnormality is noted.   Electronically Signed By: Alcide Clever M.D. On: 06/28/2016 08:40        Lumbar DG Myelogram views: No results found for this or any previous visit.  Lumbar DG Myelogram: No results found for this or any previous visit.  Lumbar DG Myelogram: No results found for this or any previous visit.  Lumbar DG Myelogram: No results found for this or any previous visit.  Lumbar DG Myelogram Lumbosacral: No results found for this or any previous visit.  Lumbar DG Diskogram views: No results found for this or any previous visit.  Lumbar DG Diskogram views: No results found for this or any previous visit.  Lumbar DG Epidurogram OP: No results found for this or any previous visit.  Lumbar DG Epidurogram IP: No valid procedures specified.  Sacroiliac Joint Imaging: Sacroiliac Joint DG: Results for orders placed during the hospital encounter of 06/27/16  DG Si Joints  Narrative CLINICAL DATA:  Chronic pain.  No reported injury .  EXAM: BILATERAL SACROILIAC JOINTS - 3+ VIEW  COMPARISON:  CT 06/01/2016.  MRI 02/08/2016  FINDINGS: Lumbosacral spine fusion. Degenerative changes lumbar spine and both SI joints. Degenerative changes both hips. Osteitis pubis again noted. No acute abnormality identified. Pelvic calcifications consistent phleboliths noted.  IMPRESSION: Lumbosacral spine fusion. Degenerative changes lumbar spine, both  SI joints, both hips. Osteitis pubis again noted. No acute abnormality identified.   Electronically Signed By: Maisie Fus  Register On: 06/28/2016 06:53  Sacroiliac Joint MR w/wo contrast: No results found for this or any previous visit.  Sacroiliac Joint MR wo contrast: No results found for this or any previous visit.   Spine Imaging: Whole Spine DG Myelogram views: No results found for this or any previous visit.  Whole Spine MR Mets screen: No results found for this or any previous visit.  Whole Spine MR Mets screen: No results found for this or any previous visit.  Whole Spine MR w/wo: No results found for this or any previous visit.  MRA Spinal Canal w/ cm: No results found for this  or any previous visit.  MRA Spinal Canal wo/ cm: No valid procedures specified. MRA Spinal Canal w/wo cm: No results found for this or any previous visit.  Spine Outside MR Films: No results found for this or any previous visit.  Spine Outside CT Films: No results found for this or any previous visit.  CT-Guided Biopsy: No results found for this or any previous visit.  CT-Guided Needle Placement: No results found for this or any previous visit.  DG Spine outside: No results found for this or any previous visit.  IR Spine outside: No results found for this or any previous visit.  NM Spine outside: No results found for this or any previous visit.   Hip Imaging: Hip-R MR w contrast: No results found for this or any previous visit.  Hip-L MR w contrast: No results found for this or any previous visit.  Hip-R MR w/wo contrast: No results found for this or any previous visit.  Hip-L MR w/wo contrast: No results found for this or any previous visit.  Hip-R MR wo contrast: No results found for this or any previous visit.  Hip-L MR wo contrast: No results found for this or any previous visit.  Hip-R CT w contrast: No results found for this or any previous visit.  Hip-L CT w contrast: No  results found for this or any previous visit.  Hip-R CT w/wo contrast: No results found for this or any previous visit.  Hip-L CT w/wo contrast: No results found for this or any previous visit.  Hip-R CT wo contrast: No results found for this or any previous visit.  Hip-L CT wo contrast: No results found for this or any previous visit.  Hip-R DG 2-3 views: Results for orders placed during the hospital encounter of 02/16/19  DG Hip Unilat W or Wo Pelvis 2-3 Views Right  Narrative CLINICAL DATA:  Pain following fall  EXAM: DG HIP (WITH OR WITHOUT PELVIS) 2-3V RIGHT  COMPARISON:  June 27, 2016  FINDINGS: Frontal pelvis as well as frontal and lateral right hip joint images were obtained. No acute fracture or dislocation evident. There is moderate symmetric narrowing of each hip joint. There is evidence of bilateral sacroiliitis as well as osteitis condensans pubis, stable in appearance. There is postoperative change in the visualized lower lumbar spine with extensive arthropathy in the visualized lower lumbar spine.  IMPRESSION: 1. 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 Moderate symmetric narrowing of each hip joint. No acute fracture or dislocation.   Electronically Signed By: Bretta Bang III M.D. On: 02/16/2019 16:45  Hip-L DG 2-3 views: Results for orders placed during the hospital encounter of 06/27/16  DG HIP UNILAT W OR W/O PELVIS 2-3 VIEWS LEFT  Narrative CLINICAL DATA:  Chronic pain.  EXAM: DG HIP (WITH OR WITHOUT PELVIS) 2-3V LEFT  COMPARISON:  05/22/2016.  MRI 02/08/2016.  FINDINGS: Prior lumbosacral spine fusion. Degenerative changes lumbar spine, both SI joints, both hips. Osteitis pubis again noted.  IMPRESSION: No acute abnormality. Prior lumbosacral spine fusion. Degenerative changes both SI joints and hips. Osteitis pubis again noted.   Electronically Signed By: Maisie Fus   Register On: 06/28/2016 06:50  Hip-R DG Arthrogram: No results found for this or any previous visit.  Hip-L DG Arthrogram: No results found for this or any previous visit.  Hip-B DG Bilateral: No results found for this or any previous visit.   Knee Imaging: Knee-R MR w contrast: No results found  for this or any previous visit.  Knee-L MR w/o contrast: No results found for this or any previous visit.  Knee-R MR w/wo contrast: No results found for this or any previous visit.  Knee-L MR w/wo contrast: No results found for this or any previous visit.  Knee-R MR wo contrast: No results found for this or any previous visit.  Knee-L MR wo contrast: No results found for this or any previous visit.  Knee-R CT w contrast: No results found for this or any previous visit.  Knee-L CT w contrast: No results found for this or any previous visit.  Knee-R CT w/wo contrast: No results found for this or any previous visit.  Knee-L CT w/wo contrast: No results found for this or any previous visit.  Knee-R CT wo contrast: No results found for this or any previous visit.  Knee-L CT wo contrast: No results found for this or any previous visit.  Knee-R DG 1-2 views: Results for orders placed during the hospital encounter of 06/27/16  DG Knee 1-2 Views Right  Narrative CLINICAL DATA:  Right knee pain, no known injury, initial encounter  EXAM: RIGHT KNEE - 1-2 VIEW  COMPARISON:  None.  FINDINGS: No evidence of fracture, dislocation, or joint effusion. Mild medial joint space narrowing is noted. No soft tissue abnormality is seen.  IMPRESSION: Minimal degenerative changes.   Electronically Signed By: Alcide Clever M.D. On: 06/28/2016 08:39  Knee-L DG 1-2 views: Results for orders placed during the hospital encounter of 06/27/16  DG Knee 1-2 Views Left  Narrative CLINICAL DATA:  Knee pain common no known injury, initial encounter  EXAM: LEFT KNEE - 1-2 VIEW  COMPARISON:   None.  FINDINGS: No acute fracture or dislocation is noted. No joint effusion is seen. Very minimal medial joint space narrowing is noted.  IMPRESSION: Minimal degenerative change without acute abnormality.   Electronically Signed By: Alcide Clever M.D. On: 06/28/2016 08:38  Knee-R DG 3 views: No results found for this or any previous visit.  Knee-L DG 3 views: No results found for this or any previous visit.  Knee-R DG 4 views: No results found for this or any previous visit.  Knee-L DG 4 views: No results found for this or any previous visit.  Knee-R DG Arthrogram: No results found for this or any previous visit.  Knee-L DG Arthrogram: No results found for this or any previous visit.   Ankle Imaging: Ankle-R DG Complete: Results for orders placed in visit on 03/17/19  DG Ankle Complete Right  Narrative Please see detailed radiograph report in office note.  Ankle-L DG Complete: Results for orders placed in visit on 05/12/19  DG Ankle Complete Left  Narrative Please see detailed radiograph report in office note.   Foot Imaging: Foot-R DG Complete: Results for orders placed in visit on 09/08/19  DG Foot Complete Right  Narrative Please see detailed radiograph report in office note.  Foot-L DG Complete: Results for orders placed in visit on 05/12/19  DG Foot Complete Left  Narrative Please see detailed radiograph report in office note.   Elbow Imaging: Elbow-R DG Complete: No results found for this or any previous visit.  Elbow-L DG Complete: No results found for this or any previous visit.   Wrist Imaging: Wrist-R DG Complete: No results found for this or any previous visit.  Wrist-L DG Complete: No results found for this or any previous visit.   Hand Imaging: Hand-R DG Complete: No results found for this or  any previous visit.  Hand-L DG Complete: No results found for this or any previous visit.   Complexity Note: Imaging results reviewed.                          ROS  Cardiovascular: {Hx; Cardiovascular History:210120525} Pulmonary or Respiratory: {Hx; Pumonary and/or Respiratory History:210120523} Neurological: {Hx; Neurological:210120504} Psychological-Psychiatric: {Hx; Psychological-Psychiatric History:210120512} Gastrointestinal: {Hx; Gastrointestinal:210120527} Genitourinary: {Hx; Genitourinary:210120506} Hematological: {Hx; Hematological:210120510} Endocrine: {Hx; Endocrine history:210120509} Rheumatologic: {Hx; Rheumatological:210120530} Musculoskeletal: {Hx; Musculoskeletal:210120528} Work History: {Hx; Work history:210120514}  Allergies  Amy [name] is allergic to latex, crestor [rosuvastatin calcium], penicillins, tetanus toxoids, and tomato.  Laboratory Chemistry Profile   Renal Lab Results  Component Value Date   BUN 18 12/05/2022   CREATININE 1.20 (H) 12/05/2022   BCR 15 12/05/2022   GFRAA 66 07/28/2020   GFRNONAA 58 (L) 10/25/2022   PROTEINUR NEGATIVE 09/29/2022     Electrolytes Lab Results  Component Value Date   NA 141 12/05/2022   K 4.6 12/05/2022   CL 98 12/05/2022   CALCIUM 9.8 12/05/2022   MG 2.0 09/29/2022   PHOS 3.6 07/27/2016     Hepatic Lab Results  Component Value Date   AST 21 09/29/2022   ALT 16 09/29/2022   ALBUMIN 3.8 09/29/2022   ALKPHOS 103 09/29/2022   LIPASE 34 03/20/2021     ID Lab Results  Component Value Date   SARSCOV2NAA NEGATIVE 09/29/2022   STAPHAUREUS POSITIVE (A) 02/04/2018   MRSAPCR NEGATIVE 02/04/2018     Bone Lab Results  Component Value Date   25OHVITD1 30 06/27/2016   25OHVITD2 <1.0 06/27/2016   25OHVITD3 29 06/27/2016     Endocrine Lab Results  Component Value Date   GLUCOSE 90 12/05/2022   GLUCOSEU NEGATIVE 09/29/2022   HGBA1C 6.4 (H) 07/28/2020   TSH 1.770 07/28/2020     Neuropathy Lab Results  Component Value Date   VITAMINB12 2,661 (H) 06/27/2016   HGBA1C 6.4 (H) 07/28/2020     CNS No results found for: "COLORCSF",  "APPEARCSF", "RBCCOUNTCSF", "WBCCSF", "POLYSCSF", "LYMPHSCSF", "EOSCSF", "PROTEINCSF", "GLUCCSF", "JCVIRUS", "CSFOLI", "IGGCSF", "LABACHR", "ACETBL"   Inflammation (CRP: Acute  ESR: Chronic) Lab Results  Component Value Date   CRP 0.8 04/27/2021   ESRSEDRATE 23 (H) 09/29/2022   LATICACIDVEN 1.09 07/21/2016     Rheumatology No results found for: "RF", "ANA", "LABURIC", "URICUR", "LYMEIGGIGMAB", "LYMEABIGMQN", "HLAB27"   Coagulation Lab Results  Component Value Date   INR 1.08 07/03/2016   LABPROT 14.0 07/03/2016   APTT 32 07/03/2016   PLT 313 09/29/2022   DDIMER 3.27 (H) 03/31/2019     Cardiovascular Lab Results  Component Value Date   BNP 24.7 04/05/2018   CKTOTAL 264 (H) 03/04/2009   CKMB 3.6 03/04/2009   TROPONINI <0.03 04/05/2018   HGB 13.5 09/29/2022   HCT 40.0 09/29/2022     Screening Lab Results  Component Value Date   SARSCOV2NAA NEGATIVE 09/29/2022   STAPHAUREUS POSITIVE (A) 02/04/2018   MRSAPCR NEGATIVE 02/04/2018     Cancer No results found for: "CEA", "CA125", "LABCA2"   Allergens No results found for: "ALMOND", "APPLE", "ASPARAGUS", "AVOCADO", "BANANA", "BARLEY", "BASIL", "BAYLEAF", "GREENBEAN", "LIMABEAN", "WHITEBEAN", "BEEFIGE", "REDBEET", "BLUEBERRY", "BROCCOLI", "CABBAGE", "MELON", "CARROT", "CASEIN", "CASHEWNUT", "CAULIFLOWER", "CELERY"     Note: Lab results reviewed.  PFSH  Drug: Amy [name]  reports no history of drug use. Alcohol:  reports that she does not currently use alcohol. Tobacco:  reports that she quit smoking about 53 years ago. Her smoking use  included cigarettes. She has a 1.25 pack-year smoking history. She has never used smokeless tobacco. Medical:  has a past medical history of Abdominal pain of unknown etiology (02/05/2016), Anxiety, Arthritis, Asthma, Chronic back pain, Complication of anesthesia, Constipation, Depression, Dysphonia, Esophageal dysmotility, Fibromyalgia, Fibromyalgia, GERD (gastroesophageal reflux  disease), Glaucoma, Headache, High cholesterol, History of DVT (deep vein thrombosis) (1989), History of hiatal hernia, History of MRSA infection (2011), Hypertension, Hypothyroidism, Irregular heart rate, Low iron, Lumbago, Mild obstructive sleep apnea, Neuropathy, Pelvic pain in female, Peripheral vascular disease (HCC), SBO (small bowel obstruction) (HCC) (06/2016), Seasonal allergies, Short of breath on exertion, Urinary frequency, Vaginal atrophy, and Weakness. Family: family history includes Aneurysm in her maternal grandmother; Cancer in her daughter, mother, and sister; Colon cancer in her father; Colon polyps in her father; Diabetes in her mother; Hypertension in her brother, brother, father, mother, and sister; Kidney disease in her brother and mother; Liver cancer in her brother and brother; Osteoarthritis in her mother; Stroke in her sister.  Past Surgical History:  Procedure Laterality Date   abdominal adhesions removed     ABDOMINAL HYSTERECTOMY  1978   BACK SURGERY     BUNIONECTOMY Left 2008   CATARACT EXTRACTION Bilateral 2017   COLON SURGERY     COLONOSCOPY     CYSTO WITH HYDRODISTENSION N/A 07/12/2015   Procedure: CYSTOSCOPY/HYDRODISTENSION;  Surgeon: Alfredo Martinez, MD;  Location: [facility];  Service: Urology;  Laterality: N/A;   DILATION AND CURETTAGE OF UTERUS     ESOPHAGEAL MANOMETRY N/A 11/22/2014   Procedure: ESOPHAGEAL MANOMETRY (EM);  Surgeon: Vertell Novak., MD;  Location: [facility];  Service: Endoscopy;  Laterality: N/A;   ESOPHAGOGASTRODUODENOSCOPY N/A 07/15/2013   Procedure: ESOPHAGOGASTRODUODENOSCOPY (EGD);  Surgeon: Vertell Novak., MD;  Location: [facility];  Service: Endoscopy;  Laterality: N/A;  need xray   EXPLORATORY LAPAROTOMY     EYE SURGERY Bilateral    cataract surgery with lens implants   HARDWARE REMOVAL N/A 01/02/2016   Procedure: Exploration of Lumbar Fusion,Removal of hardware Lumbar One-Two ;Redo Posterior Lumbar  Fusion Lumbar One-Two;  Surgeon: Donalee Citrin, MD;  Location: [facility];  Service: Neurosurgery;  Laterality: N/A;   ingrown toenail removal     LAPAROSCOPIC CHOLECYSTECTOMY  01-11-2006   LAPAROSCOPY N/A 07/06/2016   Procedure: LAPAROSCOPY DIAGNOSTIC EMERGENT TO OPEN;  Surgeon: Gaynelle Adu, MD;  Location: [facility] OR;  Service: General;  Laterality: N/A;   LAPAROTOMY N/A 07/06/2016   Procedure: EXPLORATORY LAPAROTOMY LYSIS OF ADHESIONS FOR 3 HOURS;  Surgeon: Gaynelle Adu, MD;  Location: [facility] OR;  Service: General;  Laterality: N/A;   lipoma removal     LUMBAR FUSION  2014   L4 -- L5   MASS EXCISION Left 02/20/2013   Procedure: EXCISION LEFT BUTTOCK  MASS;  Surgeon: Ardeth Sportsman, MD;  Location: [facility];  Service: General;  Laterality: Left;   NOSE SURGERY  2007   REMOVAL HARDWARE L4-L5/  BILATERAL LAMINECTOMY L2 - L5 AND FUSION  06-12-2011   SHOULDER OPEN ROTATOR CUFF REPAIR Left 05/05/2014   Procedure: OPEN ACROMIONECTOMY AND OPEN REPAIR OF ROTATOR CUFF, TISSUEMEND GRAFT WITH ANCHOR ;  Surgeon: Jacki Cones, MD;  Location: [facility];  Service: Orthopedics;  Laterality: Left;   SPINE SURGERY     x6   TONSILLECTOMY     Active Ambulatory Problems    Diagnosis Date Noted   Obstructive sleep apnea 09/12/2007   HTN (hypertension) 09/12/2007   Allergic rhinitis 09/12/2007   SLEEPINESS  09/12/2007   DYSPNEA 01/23/2008   ANGINA, HX OF 09/12/2007   Morbid obesity (HCC) 02/03/2013   Lipoma of buttock s/p excision 02/20/2013 02/03/2013   Depression 08/06/2013   Hypokalemia 08/06/2013   GERD (gastroesophageal reflux disease) 08/06/2013   Spinal stenosis of lumbar region 08/06/2013   Bladder spasm 08/06/2013   Complete rotator cuff tear of left shoulder 05/05/2014   Neuropathic pain of both legs 10/13/2015   Constipation 10/13/2015   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   Long term current use of opiate analgesic 06/26/2016   Long term prescription opiate use 06/26/2016   Opiate use 06/26/2016    Encounter for therapeutic drug level monitoring 06/26/2016   Encounter for pain management planning 06/26/2016   Chronic pain 06/26/2016   Disturbance of skin sensation 06/26/2016   Chronic low back pain (Location of Primary Source of Pain) (Bilateral) (R>L) 06/26/2016   Chronic hip pain (Location of Secondary source of pain) (Bilateral) (R>L) 06/26/2016   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) 06/26/2016   Chronic shoulder pain (Location of Tertiary source of pain) (Bilateral) (R>L) 06/26/2016   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) 06/26/2016   Chronic knee pain (Bilateral) (R>L) 06/26/2016   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) 06/26/2016   Chronic neck pain (Right) 06/26/2016   Lumbar spondylosis 06/26/2016   Lumbar facet syndrome 06/26/2016   Lumbar facet hypertrophy 06/26/2016   Epidural fibrosis 06/26/2016   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   Neurogenic pain 06/26/2016   Occipital headaches 06/26/2016   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   History of lumbar fusion 06/26/2016   Pancreatitis 07/02/2016   SBO (small bowel obstruction) (HCC) 07/02/2016   Dyspnea 07/21/2016   Acute encephalopathy 07/21/2016   Anemia 07/21/2016   Wound dehiscence 07/22/2016   Snoring 01/22/2019   Morbid obesity with body mass index of 45.0-49.9 in adult (HCC) 01/22/2019   OSA on CPAP NOT COMPLIANT 01/22/2019   Uncontrolled daytime somnolence 04/12/2019   Resolved Ambulatory Problems    Diagnosis Date Noted   Cough 01/23/2008   UTI (lower urinary tract infection) 01/31/2016   Abdominal pain of unknown etiology 02/05/2016   Acute respiratory failure (HCC) 07/21/2016   Past Medical History:  Diagnosis Date   Anxiety    Arthritis    Asthma    Chronic back pain    Complication of anesthesia    Dysphonia    Esophageal dysmotility    Fibromyalgia    Fibromyalgia    Glaucoma    Headache    High cholesterol    History of DVT (deep vein thrombosis) 1989    History of hiatal hernia    History of MRSA infection 2011   Hypertension    Hypothyroidism    Irregular heart rate    Low iron    Lumbago    Mild obstructive sleep apnea    Neuropathy    Pelvic pain in female    Peripheral vascular disease (HCC)    Seasonal allergies    Short of breath on exertion    Urinary frequency    Vaginal atrophy    Weakness    Constitutional Exam  General appearance: Well nourished, well developed, and well hydrated. In no apparent acute distress There were no vitals filed for this visit. BMI Assessment: Estimated body mass index is 47.13 kg/m as calculated from the following:   Height as of 11/28/22: 5\' 5"  (1.651 m).   Weight as of 11/28/22: 283 lb 3.2 oz (128.5 kg).  BMI interpretation table: BMI  level Category Range association with higher incidence of chronic pain  <18 kg/m2 Underweight   18.5-24.9 kg/m2 Ideal body weight   25-29.9 kg/m2 Overweight Increased incidence by 20%  30-34.9 kg/m2 Obese (Class I) Increased incidence by 68%  35-39.9 kg/m2 Severe obesity (Class II) Increased incidence by 136%  >40 kg/m2 Extreme obesity (Class III) Increased incidence by 254%   Patient's current BMI Ideal Body weight  There is no height or weight on file to calculate BMI. Patient weight not recorded   BMI Readings from Last 4 Encounters:  11/28/22 47.13 kg/m  11/22/22 46.93 kg/m  10/25/22 46.99 kg/m  09/30/22 47.25 kg/m   Wt Readings from Last 4 Encounters:  11/28/22 283 lb 3.2 oz (128.5 kg)  11/22/22 282 lb (127.9 kg)  10/25/22 282 lb 6.4 oz (128.1 kg)  09/30/22 283 lb 15.2 oz (128.8 kg)    Psych/Mental status: Alert, oriented x 3 (person, place, & time)       Eyes: PERLA Respiratory: No evidence of acute respiratory distress  Assessment  Primary Diagnosis & Pertinent Problem List: There were no encounter diagnoses.  Visit Diagnosis (New problems to examiner): No diagnosis found. Plan of Care (Initial workup plan)  Note: Ms.  [name] was reminded that as per protocol, today's visit has been an evaluation only. We have not taken over the patient's controlled substance management.  Problem-specific plan: No problem-specific Assessment & Plan notes found for this encounter.  Lab Orders  No laboratory test(s) ordered today   Imaging Orders  No imaging studies ordered today   Referral Orders  No referral(s) requested today   Procedure Orders    No procedure(s) ordered today   Pharmacotherapy (current): Medications ordered:  No orders of the defined types were placed in this encounter.  Medications administered during this visit: Amy [name] had no medications administered during this visit.   Analgesic Pharmacotherapy:  Opioid Analgesics: For patients currently taking or requesting to take opioid analgesics, in accordance with [facility] Guidelines, we will assess their risks and indications for the use of these substances. After completing our evaluation, we may offer recommendations, but we no longer take patients for medication management. The prescribing physician will ultimately decide, based on his/her training and level of comfort whether to adopt any of the recommendations, including whether or not to prescribe such medicines.  Membrane stabilizer: To be determined at a later time  Muscle relaxant: To be determined at a later time  NSAID: To be determined at a later time  Other analgesic(s): To be determined at a later time   Interventional management options: Ms. Slappey was informed that there is no guarantee that she would be a candidate for interventional therapies. The decision will be based on the results of diagnostic studies, as well as Amy [name]'s risk profile.  Procedure(s) under consideration:  Pending results of ordered studies      Interventional Therapies  Risk Factors  Considerations:     Planned  Pending:      Under  consideration:   Pending   Completed:   None at this time   Therapeutic  Palliative (PRN) options:   None established   Completed by other providers:   None reported       Provider-requested follow-up: No follow-ups on file.  Future Appointments  Date Time Provider Department Center  01/28/2023 10:00 AM Delano Metz, MD [facility] None  02/22/2023  9:00 AM Debbe Odea, MD [facility] None  05/01/2023  8:00 AM [facility]    Duration of encounter: *** minutes.  Total time on encounter, as per AMA guidelines included both the face-to-face and non-face-to-face time personally spent by the physician and/or other qualified health care professional(s) on the day of the encounter (includes time in activities that require the physician or other qualified health care professional and does not include time in activities normally performed by clinical staff). Physician's time may include the following activities when performed: Preparing to see the patient (e.g., pre-charting review of records, searching for previously ordered imaging, lab work, and nerve conduction tests) Review of prior analgesic pharmacotherapies. Reviewing PMP Interpreting ordered tests (e.g., lab work, imaging, nerve conduction tests) Performing post-procedure evaluations, including interpretation of diagnostic procedures Obtaining and/or reviewing separately obtained history Performing a medically appropriate examination and/or evaluation Counseling and educating the patient/family/caregiver Ordering medications, tests, or procedures Referring and communicating with other health care professionals (when not separately reported) Documenting clinical information in the electronic or other health record Independently interpreting results (not separately reported) and communicating results to the patient/ family/caregiver Care coordination (not separately reported)  Note by: Oswaldo Done,  MD (TTS technology used. I apologize for any typographical errors that were not detected and corrected.) Date: 01/28/2023; Time: 4:00 PM

## 2023-01-28 ENCOUNTER — Encounter: Payer: Self-pay | Admitting: Pain Medicine

## 2023-01-28 ENCOUNTER — Ambulatory Visit: Payer: Medicare Other | Attending: Pain Medicine | Admitting: Pain Medicine

## 2023-01-28 VITALS — BP 129/62 | HR 69 | Temp 97.0°F | Resp 16 | Ht 65.0 in | Wt 282.0 lb

## 2023-01-28 DIAGNOSIS — G894 Chronic pain syndrome: Secondary | ICD-10-CM | POA: Insufficient documentation

## 2023-01-28 DIAGNOSIS — G96198 Other disorders of meninges, not elsewhere classified: Secondary | ICD-10-CM | POA: Insufficient documentation

## 2023-01-28 DIAGNOSIS — M961 Postlaminectomy syndrome, not elsewhere classified: Secondary | ICD-10-CM | POA: Diagnosis not present

## 2023-01-28 DIAGNOSIS — M545 Low back pain, unspecified: Secondary | ICD-10-CM | POA: Insufficient documentation

## 2023-01-28 DIAGNOSIS — M5414 Radiculopathy, thoracic region: Secondary | ICD-10-CM | POA: Diagnosis not present

## 2023-01-28 DIAGNOSIS — D34 Benign neoplasm of thyroid gland: Secondary | ICD-10-CM | POA: Insufficient documentation

## 2023-01-28 DIAGNOSIS — M4804 Spinal stenosis, thoracic region: Secondary | ICD-10-CM | POA: Insufficient documentation

## 2023-01-28 DIAGNOSIS — Z789 Other specified health status: Secondary | ICD-10-CM | POA: Diagnosis not present

## 2023-01-28 DIAGNOSIS — G8929 Other chronic pain: Secondary | ICD-10-CM | POA: Insufficient documentation

## 2023-01-28 DIAGNOSIS — C7951 Secondary malignant neoplasm of bone: Secondary | ICD-10-CM | POA: Insufficient documentation

## 2023-01-28 DIAGNOSIS — Z79899 Other long term (current) drug therapy: Secondary | ICD-10-CM | POA: Diagnosis not present

## 2023-01-28 DIAGNOSIS — I739 Peripheral vascular disease, unspecified: Secondary | ICD-10-CM | POA: Insufficient documentation

## 2023-01-28 DIAGNOSIS — M546 Pain in thoracic spine: Secondary | ICD-10-CM | POA: Insufficient documentation

## 2023-01-28 DIAGNOSIS — R700 Elevated erythrocyte sedimentation rate: Secondary | ICD-10-CM | POA: Diagnosis not present

## 2023-01-28 DIAGNOSIS — M25551 Pain in right hip: Secondary | ICD-10-CM | POA: Insufficient documentation

## 2023-01-28 DIAGNOSIS — M25552 Pain in left hip: Secondary | ICD-10-CM | POA: Insufficient documentation

## 2023-01-28 DIAGNOSIS — R937 Abnormal findings on diagnostic imaging of other parts of musculoskeletal system: Secondary | ICD-10-CM | POA: Insufficient documentation

## 2023-01-28 DIAGNOSIS — Z0189 Encounter for other specified special examinations: Secondary | ICD-10-CM | POA: Diagnosis not present

## 2023-01-28 DIAGNOSIS — M899 Disorder of bone, unspecified: Secondary | ICD-10-CM | POA: Diagnosis not present

## 2023-01-28 DIAGNOSIS — F32A Depression, unspecified: Secondary | ICD-10-CM | POA: Diagnosis not present

## 2023-01-28 DIAGNOSIS — M533 Sacrococcygeal disorders, not elsewhere classified: Secondary | ICD-10-CM | POA: Insufficient documentation

## 2023-01-28 NOTE — Progress Notes (Signed)
Safety precautions to be maintained throughout the outpatient stay will include: orient to surroundings, keep bed in low position, maintain call bell within reach at all times, provide assistance with transfer out of bed and ambulation.  

## 2023-01-28 NOTE — Patient Instructions (Signed)
____________________________________________________________________________________________  New Patients  Welcome to [facility].   Initial Visit The first or initial visit consists of an evaluation only.   Interventional pain management.  We offer therapies other than opioid controlled substances to manage chronic pain. These include, but are not limited to, diagnostic, therapeutic, and palliative specialized injection therapies (i.e.: Epidural Steroids, Facet Blocks, etc.). We specialize in a variety of nerve blocks as well as radiofrequency treatments. We offer pain implant evaluations and trials, as well as follow up management. In addition we also provide a variety joint injections, including Viscosupplementation (AKA: Gel Therapy).  Prescription Pain Medication. We specialize in alternatives to opioids. We can provide evaluations and recommendations for/of pharmacologic therapies based on CDC Guidelines.  We no longer take patients for long-term medication management. We will not be taking over your pain medications.  ____________________________________________________________________________________________    ____________________________________________________________________________________________  Patient Information update  To: All of our patients.  Re: Name change.  It has been made official that our current name, "[facility]"   will soon be changed to "[facility]".   The purpose of this change is to eliminate any confusion created by the concept of our practice being a "Medication Management Pain Clinic". In the past this has led to the misconception that we treat pain primarily by the use of prescription medications.  Nothing can be farther from the truth.   Understanding PAIN MANAGEMENT: To  further understand what our practice does, you first have to understand that "Pain Management" is a subspecialty that requires additional training once a physician has completed their specialty training, which can be in either Anesthesia, Neurology, Psychiatry, or Physical Medicine and Rehabilitation (PMR). Each one of these contributes to the final approach taken by each physician to the management of their patient's pain. To be a "Pain Management Specialist" you must have first completed one of the specialty trainings below.  Anesthesiologists - trained in clinical pharmacology and interventional techniques such as nerve blockade and regional as well as central neuroanatomy. They are trained to block pain before, during, and after surgical interventions.  Neurologists - trained in the diagnosis and pharmacological treatment of complex neurological conditions, such as Multiple Sclerosis, Parkinson's, spinal cord injuries, and other systemic conditions that may be associated with symptoms that may include but are not limited to pain. They tend to rely primarily on the treatment of chronic pain using prescription medications.  Psychiatrist - trained in conditions affecting the psychosocial wellbeing of patients including but not limited to depression, anxiety, schizophrenia, personality disorders, addiction, and other substance use disorders that may be associated with chronic pain. They tend to rely primarily on the treatment of chronic pain using prescription medications.   Physical Medicine and Rehabilitation (PMR) physicians, also known as physiatrists - trained to treat a wide variety of medical conditions affecting the brain, spinal cord, nerves, bones, joints, ligaments, muscles, and tendons. Their training is primarily aimed at treating patients that have suffered injuries that have caused severe physical impairment. Their training is primarily aimed at the physical therapy and rehabilitation of those  patients. They may also work alongside orthopedic surgeons or neurosurgeons using their expertise in assisting surgical patients to recover after their surgeries.  INTERVENTIONAL PAIN MANAGEMENT is sub-subspecialty of Pain Management.  Our physicians are Board-certified in Anesthesia, Pain Management, and Interventional Pain Management.  This meaning that not only have they been trained   and Board-certified in their specialty of Anesthesia, and subspecialty of Pain Management, but they have also received further training in the sub-subspecialty of Interventional Pain Management, in order to become Board-certified as INTERVENTIONAL PAIN MANAGEMENT SPECIALIST.    Mission: Our goal is to use our skills in  INTERVENTIONAL PAIN MANAGEMENT as alternatives to the chronic use of prescription opioid medications for the treatment of pain. To make this more clear, we have changed our name to reflect what we do and offer. We will continue to offer medication management assessment and recommendations, but we will not be taking over any patient's medication management.  ____________________________________________________________________________________________     

## 2023-01-29 LAB — VITAMIN B12: Vitamin B-12: 254 pg/mL (ref 232–1245)

## 2023-01-30 LAB — COMPLIANCE DRUG ANALYSIS, UR

## 2023-02-02 LAB — C-REACTIVE PROTEIN: CRP: 1 mg/L (ref 0–10)

## 2023-02-02 LAB — 25-HYDROXY VITAMIN D LCMS D2+D3
25-Hydroxy, Vitamin D-2: <1 ng/mL
25-Hydroxy, Vitamin D-3: 47 ng/mL
25-Hydroxy, Vitamin D: 47 ng/mL

## 2023-02-02 LAB — SEDIMENTATION RATE: Sed Rate: 34 mm/hr (ref 0–40)

## 2023-02-12 ENCOUNTER — Other Ambulatory Visit
Admission: RE | Admit: 2023-02-12 | Discharge: 2023-02-12 | Disposition: A | Discharge status: Home / Self Care | Payer: Medicare Other | Source: Home / Self Care | Attending: Cardiology | Admitting: Cardiology

## 2023-02-12 ENCOUNTER — Other Ambulatory Visit: Payer: Self-pay | Admitting: Pain Medicine

## 2023-02-12 ENCOUNTER — Ambulatory Visit
Admission: RE | Admit: 2023-02-12 | Discharge: 2023-02-12 | Disposition: A | Discharge status: Home / Self Care | Payer: Medicare Other | Attending: Pain Medicine | Admitting: Pain Medicine

## 2023-02-12 ENCOUNTER — Ambulatory Visit
Admission: RE | Admit: 2023-02-12 | Discharge: 2023-02-12 | Disposition: A | Discharge status: Home / Self Care | Payer: Medicare Other | Source: Ambulatory Visit | Attending: Pain Medicine | Admitting: Pain Medicine

## 2023-02-12 DIAGNOSIS — M5414 Radiculopathy, thoracic region: Secondary | ICD-10-CM

## 2023-02-12 DIAGNOSIS — G8929 Other chronic pain: Secondary | ICD-10-CM

## 2023-02-12 DIAGNOSIS — M25552 Pain in left hip: Secondary | ICD-10-CM | POA: Insufficient documentation

## 2023-02-12 DIAGNOSIS — M961 Postlaminectomy syndrome, not elsewhere classified: Secondary | ICD-10-CM | POA: Diagnosis not present

## 2023-02-12 DIAGNOSIS — R0609 Other forms of dyspnea: Secondary | ICD-10-CM

## 2023-02-12 DIAGNOSIS — M160 Bilateral primary osteoarthritis of hip: Secondary | ICD-10-CM | POA: Diagnosis not present

## 2023-02-12 DIAGNOSIS — M545 Low back pain, unspecified: Secondary | ICD-10-CM

## 2023-02-12 DIAGNOSIS — M4804 Spinal stenosis, thoracic region: Secondary | ICD-10-CM

## 2023-02-12 DIAGNOSIS — G96198 Other disorders of meninges, not elsewhere classified: Secondary | ICD-10-CM

## 2023-02-12 DIAGNOSIS — R700 Elevated erythrocyte sedimentation rate: Secondary | ICD-10-CM

## 2023-02-12 DIAGNOSIS — M25551 Pain in right hip: Secondary | ICD-10-CM | POA: Insufficient documentation

## 2023-02-12 DIAGNOSIS — R937 Abnormal findings on diagnostic imaging of other parts of musculoskeletal system: Secondary | ICD-10-CM

## 2023-02-12 DIAGNOSIS — I5032 Chronic diastolic (congestive) heart failure: Secondary | ICD-10-CM

## 2023-02-12 DIAGNOSIS — G894 Chronic pain syndrome: Secondary | ICD-10-CM

## 2023-02-12 DIAGNOSIS — M546 Pain in thoracic spine: Secondary | ICD-10-CM | POA: Diagnosis not present

## 2023-02-12 DIAGNOSIS — Z79899 Other long term (current) drug therapy: Secondary | ICD-10-CM

## 2023-02-12 DIAGNOSIS — Z789 Other specified health status: Secondary | ICD-10-CM

## 2023-02-12 DIAGNOSIS — M899 Disorder of bone, unspecified: Secondary | ICD-10-CM

## 2023-02-12 LAB — BASIC METABOLIC PANEL
Anion gap: 10 (ref 5–15)
BUN: 32 mg/dL — ABNORMAL HIGH (ref 8–23)
CO2: 31 mmol/L (ref 22–32)
Calcium: 9.2 mg/dL (ref 8.9–10.3)
Chloride: 99 mmol/L (ref 98–111)
Creatinine, Ser: 1.65 mg/dL — ABNORMAL HIGH (ref 0.44–1.00)
GFR, Estimated: 32 mL/min — ABNORMAL LOW (ref >60)
Glucose, Bld: 130 mg/dL — ABNORMAL HIGH (ref 70–99)
Potassium: 4.1 mmol/L (ref 3.5–5.1)
Sodium: 140 mmol/L (ref 135–145)

## 2023-02-15 ENCOUNTER — Other Ambulatory Visit: Payer: Self-pay

## 2023-02-15 DIAGNOSIS — R0609 Other forms of dyspnea: Secondary | ICD-10-CM

## 2023-02-15 MED ORDER — FUROSEMIDE 20 MG PO TABS: 20.0000 mg | ORAL_TABLET | Freq: Every day | ORAL | 0 refills | Status: DC

## 2023-02-22 ENCOUNTER — Ambulatory Visit: Payer: Medicare Other | Attending: Cardiology | Admitting: Cardiology

## 2023-02-22 ENCOUNTER — Ambulatory Visit (INDEPENDENT_AMBULATORY_CARE_PROVIDER_SITE_OTHER): Payer: Medicare Other

## 2023-02-22 ENCOUNTER — Encounter: Payer: Self-pay | Admitting: Cardiology

## 2023-02-22 VITALS — BP 142/68 | HR 71 | Resp 95

## 2023-02-22 DIAGNOSIS — Z79899 Other long term (current) drug therapy: Secondary | ICD-10-CM | POA: Insufficient documentation

## 2023-02-22 DIAGNOSIS — R002 Palpitations: Secondary | ICD-10-CM | POA: Diagnosis not present

## 2023-02-22 DIAGNOSIS — I10 Essential (primary) hypertension: Secondary | ICD-10-CM | POA: Diagnosis not present

## 2023-02-22 DIAGNOSIS — I2510 Atherosclerotic heart disease of native coronary artery without angina pectoris: Secondary | ICD-10-CM | POA: Diagnosis not present

## 2023-02-22 DIAGNOSIS — I5032 Chronic diastolic (congestive) heart failure: Secondary | ICD-10-CM | POA: Diagnosis not present

## 2023-02-22 MED ORDER — HYDRALAZINE HCL 100 MG PO TABS: 100.0000 mg | ORAL_TABLET | Freq: Three times a day (TID) | ORAL | 1 refills | Status: DC

## 2023-02-22 MED ORDER — NIFEDIPINE ER OSMOTIC RELEASE 60 MG PO TB24: 60.0000 mg | ORAL_TABLET | Freq: Every day | ORAL | 1 refills | Status: DC

## 2023-02-22 NOTE — Progress Notes (Signed)
Cardiology Office Note:    Date:  02/22/2023   ID:  Amy [name], DOB 21-Jan-1946, MRN [id number]  PCP:  [facility]   [facility]  Cardiologist:  Debbe Odea, MD  Advanced Practice Provider:  No care team member to display Electrophysiologist:  None       Referring MD: [facility]   Chief Complaint  Patient presents with   Follow-up    Patient reports increasing dyspnea on exertion.  More frequent occasional palpitations with last episode yesterday.      History of Present Illness:    Amy [name] is a 77 y.o. 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   Complains of palpitations ongoing at least 3 times weekly, not associated with exertion.  Usually lasting a few seconds.  Has chronic shortness of breath, occasional headaches.  Not compliant with CPAP mask as prescribed, usually forgets.  Lasix previously increased to 40 mg daily with worsening creatinine, this has been reduced back to 20 mg daily.  Patient feels due to a close friend being diagnosed with colon cancer.  Denies chest pain.  Prior notes Coronary CTA 10/2022, mild LAD stenosis 25% Lexiscan Myoview 01/2021 no evidence for ischemia, low risk study. Echo 02/07/2021 normal systolic function, EF 60 to 65%, Echo 06/2016 showed normal systolic function, EF 60 to 65%.  Impaired relaxation.  Past Medical History:  Diagnosis Date   Abdominal pain of unknown etiology 02/05/2016   Anxiety    Arthritis    ddd- RA   Asthma    "sleeping asthma"   Chronic back pain    lumbar steroid injection's   Complication of anesthesia    Hard to wake up. Pt sts is took 3 days.   Constipation    Depression    Dysphonia    intermittent "voice changes"   Esophageal dysmotility    Fibromyalgia    Fibromyalgia    GERD  (gastroesophageal reflux disease)    Glaucoma    Headache    High cholesterol    ? pt states her doctor told her to continue eating vegetables & fruit   History of DVT (deep vein thrombosis) 1989   LEFT UPPER ARM   History of hiatal hernia    History of MRSA infection 2011   Hypertension    Hypothyroidism    Irregular heart rate    "years ago"   Low iron    Lumbago    Mild obstructive sleep apnea    per study 02-07-2006 - no cpap   Neuropathy    feet   Pelvic pain in female    Peripheral vascular disease (HCC)    "poor circulation"   SBO (small bowel obstruction) (HCC) 06/2016   Seasonal allergies    Short of breath on exertion    Urinary frequency    Vaginal atrophy    Weakness    both hands and feet    Past Surgical History:  Procedure Laterality Date   abdominal adhesions removed     ABDOMINAL HYSTERECTOMY  1978   BACK SURGERY     BUNIONECTOMY Left 2008   CATARACT EXTRACTION Bilateral 2017   COLON SURGERY     COLONOSCOPY     CYSTO WITH HYDRODISTENSION N/A 07/12/2015   Procedure: CYSTOSCOPY/HYDRODISTENSION;  Surgeon: Alfredo Martinez, MD;  Location: [facility];  Service: Urology;  Laterality: N/A;   DILATION AND CURETTAGE OF UTERUS     ESOPHAGEAL MANOMETRY N/A 11/22/2014   Procedure: ESOPHAGEAL MANOMETRY (EM);  Surgeon: Vertell Novak., MD;  Location: [facility];  Service: Endoscopy;  Laterality: N/A;   ESOPHAGOGASTRODUODENOSCOPY N/A 07/15/2013   Procedure: ESOPHAGOGASTRODUODENOSCOPY (EGD);  Surgeon: Vertell Novak., MD;  Location: [facility];  Service: Endoscopy;  Laterality: N/A;  need xray   EXPLORATORY LAPAROTOMY     EYE SURGERY Bilateral    cataract surgery with lens implants   HARDWARE REMOVAL N/A 01/02/2016   Procedure: Exploration of Lumbar Fusion,Removal of hardware Lumbar One-Two ;Redo Posterior Lumbar Fusion Lumbar One-Two;  Surgeon: Donalee Citrin, MD;  Location: [facility];  Service: Neurosurgery;  Laterality: N/A;   ingrown  toenail removal     LAPAROSCOPIC CHOLECYSTECTOMY  01-11-2006   LAPAROSCOPY N/A 07/06/2016   Procedure: LAPAROSCOPY DIAGNOSTIC EMERGENT TO OPEN;  Surgeon: Gaynelle Adu, MD;  Location: [facility] OR;  Service: General;  Laterality: N/A;   LAPAROTOMY N/A 07/06/2016   Procedure: EXPLORATORY LAPAROTOMY LYSIS OF ADHESIONS FOR 3 HOURS;  Surgeon: Gaynelle Adu, MD;  Location: [facility] OR;  Service: General;  Laterality: N/A;   lipoma removal     LUMBAR FUSION  2014   L4 -- L5   MASS EXCISION Left 02/20/2013   Procedure: EXCISION LEFT BUTTOCK  MASS;  Surgeon: Ardeth Sportsman, MD;  Location: [facility];  Service: General;  Laterality: Left;   NOSE SURGERY  2007   REMOVAL HARDWARE L4-L5/  BILATERAL LAMINECTOMY L2 - L5 AND FUSION  06-12-2011   SHOULDER OPEN ROTATOR CUFF REPAIR Left 05/05/2014   Procedure: OPEN ACROMIONECTOMY AND OPEN REPAIR OF ROTATOR CUFF, TISSUEMEND GRAFT WITH ANCHOR ;  Surgeon: Jacki Cones, MD;  Location: [facility];  Service: Orthopedics;  Laterality: Left;   SPINE SURGERY     x6   TONSILLECTOMY      Current Medications: Current Meds  Medication Sig   acetaminophen (TYLENOL) 500 MG tablet Take 1,000 mg by mouth every 6 (six) hours as needed for moderate pain.    atorvastatin (LIPITOR) 20 MG tablet Take 1 tablet (20 mg total) by mouth daily.   B Complex Vitamins (B COMPLEX PO) Take 1 tablet by mouth daily as needed.   cloNIDine (CATAPRES) 0.1 MG tablet Take 1 tablet (0.1 mg total) by mouth 3 (three) times daily.   diphenhydrAMINE (BENADRYL) 25 MG tablet Take 25 mg by mouth 2 (two) times daily as needed for allergies.   estradiol (ESTRACE) 0.1 MG/GM vaginal cream Place 1 Applicatorful vaginally 2 (two) times a week.   fluticasone (FLONASE) 50 MCG/ACT nasal spray Place 1 spray into both nostrils daily.   furosemide (LASIX) 20 MG tablet Take 1 tablet (20 mg total) by mouth daily.   gabapentin (NEURONTIN) 600 MG tablet Take 600-1,200 mg by mouth 2 (two) times daily. Take 600mg  in the AM and 1,200mg  in  the PM   hydrALAZINE (APRESOLINE) 100 MG tablet Take 1 tablet (100 mg total) by mouth 3 (three) times daily.   levothyroxine (SYNTHROID) 25 MCG tablet Take 25 mcg by mouth every morning.   Menthol, Topical Analgesic, (ICY HOT BACK EX) Apply 1 application topically 2 (two) times daily as needed (pain).   methocarbamol (ROBAXIN) 500 MG tablet Take 500 mg by mouth 4 (four) times daily as needed for muscle spasms.   metoprolol tartrate (LOPRESSOR) 50 MG tablet Take 1 tablet (50 mg) by mouth twice daily   NIFEdipine (PROCARDIA XL/NIFEDICAL XL) 60  MG 24 hr tablet Take 1 tablet (60 mg total) by mouth daily.   pantoprazole (PROTONIX) 40 MG tablet Take 80 mg by mouth daily.    PARoxetine (PAXIL) 30 MG tablet Take 30 mg by mouth daily.   potassium chloride SA (K-DUR,KLOR-CON) 20 MEQ tablet Take 20 mEq by mouth daily.   vitamin E 180 MG (400 UNITS) capsule Take 400 Units by mouth daily as needed.   [DISCONTINUED] hydrALAZINE (APRESOLINE) 50 MG tablet Take 50 mg by mouth 3 (three) times daily.   [DISCONTINUED] hydrochlorothiazide (HYDRODIURIL) 12.5 MG tablet Take 12.5 mg by mouth daily.   [DISCONTINUED] NIFEdipine (PROCARDIA) 10 MG capsule Take 10 mg by mouth 3 (three) times daily.     Allergies:   Latex, Crestor [rosuvastatin calcium], Penicillins, Tetanus toxoids, and Tomato   Social History   Socioeconomic History   Marital status: Married    Spouse name: Not on file   Number of children: 1   Years of education: Not on file   Highest education level: Master's degree (e.g., MA, MS, MEng, MEd, MSW, MBA)  Occupational History   Not on file  Tobacco Use   Smoking status: Former    Packs/day: 0.25    Years: 5.00    Additional pack years: 0.00    Total pack years: 1.25    Types: Cigarettes    Quit date: 07/10/1969    Years since quitting: 53.6   Smokeless tobacco: Never  Vaping Use   Vaping Use: Never used  Substance and Sexual Activity   Alcohol use: Not Currently    Comment: rare, social    Drug use: No   Sexual activity: Not on file  Other Topics Concern   Not on file  Social History Narrative   Lives at home with husband    Right handed   Caffeine: max 2 cups coffee per day but not does not drink daily.   Social Determinants of Health   Financial Resource Strain: Not on file  Food Insecurity: Not on file  Transportation Needs: Not on file  Physical Activity: Not on file  Stress: Not on file  Social Connections: Not on file     Family History: The patient's family history includes Aneurysm in her maternal grandmother; Cancer in her daughter, mother, and sister; Colon cancer in her father; Colon polyps in her father; Diabetes in her mother; Hypertension in her brother, brother, father, mother, and sister; Kidney disease in her brother and mother; Liver cancer in her brother and brother; Osteoarthritis in her mother; Stroke in her sister. There is no history of Migraines or Headache.  ROS:   Please see the history of present illness.     All other systems reviewed and are negative.  EKGs/Labs/Other Studies Reviewed:    The following studies were reviewed today:   EKG:  EKG not ordered today.   Recent Labs: 09/29/2022: ALT 16; Hemoglobin 13.5; Magnesium 2.0; Platelets 313 02/12/2023: BUN 32; Creatinine, Ser 1.65; Potassium 4.1; Sodium 140  Recent Lipid Panel    Component Value Date/Time   CHOL 165 07/03/2016 0627   TRIG 148 07/27/2016 0415   HDL 82 07/03/2016 0627   CHOLHDL 2.0 07/03/2016 0627   VLDL 8 07/03/2016 0627   LDLCALC 75 07/03/2016 0627     Risk Assessment/Calculations:      Physical Exam:    VS:  BP (!) 142/68 (BP Location: Right Arm, Patient Position: Sitting, Cuff Size: Large)   Pulse 71   Resp (!) 95  Wt Readings from Last 3 Encounters:  01/28/23 282 lb (127.9 kg)  11/28/22 283 lb 3.2 oz (128.5 kg)  11/22/22 282 lb (127.9 kg)     GEN:  Well nourished, well developed in no acute distress HEENT: Normal NECK: No JVD; No  carotid bruits CARDIAC: RRR, no murmurs, rubs, gallops RESPIRATORY:  Clear to auscultation without rales, wheezing or rhonchi  ABDOMEN: Soft, non-tender, non-distended MUSCULOSKELETAL:  trace edema; No deformity  SKIN: Warm and dry NEUROLOGIC:  Alert and oriented x 3 PSYCHIATRIC:  Normal affect   ASSESSMENT:    1. Primary hypertension   2. Palpitations   3. Coronary artery disease involving native coronary artery of native heart, unspecified whether angina present   4. Chronic heart failure with preserved ejection fraction (HCC)   5. Medication management     PLAN:    In order of problems listed above:  Hypertension, BP slightly elevated, will need to consolidate medications.  Last creatinine abnormal.  Stop HCTZ, stop Procardia.  Start nifedipine 60 mg daily, increase hydralazine to 100 mg 3 times daily.  Continue clonidine and Lopressor 50 mg twice daily for now.  Consider switch to Coreg and titrate of clonidine if BP permits at follow-up visit. Palpitations, place cardiac monitor to evaluate any significant arrhythmias. Nonobstructive CAD,  mild proximal LAD stenosis 25%.  Denies chest pain.  LDL at goal, continue Lipitor. HFpEF still short of breath.  Morbid obesity, untreated sleep apnea  contributing.  Lasix 20 mg daily, check BMP prior to follow-up visit.  Follow-up in 6 weeks.   Medication Adjustments/Labs and Tests Ordered: Current medicines are reviewed at length with the patient today.  Concerns regarding medicines are outlined above.  Orders Placed This Encounter  Procedures   Basic metabolic panel   LONG TERM MONITOR (3-14 DAYS)    Meds ordered this encounter  Medications   NIFEdipine (PROCARDIA XL/NIFEDICAL XL) 60 MG 24 hr tablet    Sig: Take 1 tablet (60 mg total) by mouth daily.    Dispense:  90 tablet    Refill:  1    Stopping short acting nifedipine   hydrALAZINE (APRESOLINE) 100 MG tablet    Sig: Take 1 tablet (100 mg total) by mouth 3 (three) times  daily.    Dispense:  270 tablet    Refill:  1    Dose increase     Patient Instructions  Medication Instructions:  -  Your physician has recommended you make the following change in your medication:   1) STOP Hydrochlorothiazide (HCTZ)  2) STOP Procardia (Nifedipine) short acting dose  3) START Procardia XL (Nifedipine ER) 60 mg: - take 1 tablet by mouth once daily  4) INCREASE Hydralazine to 100 mg: - take 1 tablet by mouth three times a day  *If you need a refill on your cardiac medications before your next appointment, please call your pharmacy*   Lab Work: - Your physician recommends that you return for lab work in: 5 weeks (around 03/29/23)  BMP  Medical Pharmacologist at [facility] 1st desk on the right to check in (REGISTRATION)  Lab hours: Monday- Friday (7:30 am- 5:30 pm)   If you have labs (blood work) drawn today and your tests are completely normal, you will receive your results only by: MyChart Message (if you have MyChart) OR A paper copy in the mail If you have any lab test that is abnormal or we need to change your treatment, we will call you to review the results.  Testing/Procedures: - Heart Monitor:  Your physician has requested you wear a ZIO XT monitor for 14  days.  Your monitor will be mailed to your home address within 3-5 business days. This is sent via [employer] from [employer]. However, if you have not received your monitor after 5 business days please send [place] a MyChart message or call the office at [phone], so we may follow up on this for you.   This monitor is a medical device (single patch monitor) that records the heart's electrical activity. Doctors most often use these monitors to diagnose arrhythmias. Arrhythmias are problems with the speed or rhythm of the heartbeat.   [employer] supplies 1 patch per enrollment. Additional stickers are not available.  Please DO NOT apply the patch if you will be having a Nuclear Stress Test,  Echocardiogram, Cardiac CT, Cardiac MRI, Chest X-ray during the period you would be wearing the monitor. The patch cannot be worn during these tests.  You cannot remove and re-apply the ZIO patch monitor.   Applying the Monitor: Once you receive your monitor, this will include a small razor, abrader, and 4 alcohol pads. Shave hair from upper left chest Rub abrader disc in 40 strokes over the left upper chest as indicated in your monitor instructions Clean area with 4 enclosed alcohol pads (there may be a mild & brief stinging sensation over the newly abraded area, but this is normal). Let dry Apply patch as indicated in monitor instructions. Patch will be placed under collarbone on the left side of the chest with arrow pointing upward. Rub adhesive wings for 2 minutes. Remove white label marked "1". Remove the white label marked "2". Rub patch adhesive wings for an 2 minutes.  While looking in a mirror, press and release button in the center of the patch. You may hear a "click". A small green light will flash 4-6 times and then stop. This will be your indicator that the monitor has been turned on.  Wearing the Monitor: Avoid showering during the first 24 hours of wearing the monitor.  After 24 hours you may shower with the patch on. Take brief showers with your back facing the shower head.  Avoid excessive sweating to help maximize wear time. Do not submerge the device, no hot tubs, and no swimming pools. Keep any lotions or oils away from the patch. Press the button if you feel a symptom. You will hear a small click. Record date, time, and symptoms in the Patient Logbook or App.  Monitor Issues: Call [employer] Customer Care at [phone] if you have questions regarding your Zio Patch Monitor. Call them immediately if you see an orange/ amber colored light blinking on your monitor. If your monitor falls off and you cannot get this reapplied or if you need suggestions for  securing your monitor call [employer] at [phone].   Returning the Monitor: Once you have completed wearing your monitor, follow instructions on the last 2 pages of the Patient Logbook. Stick monitor patch on to the last page of the Patient Logbook.  Place Patient Logbook with monitor in the return box provided. Use locking tab on box and tape box closed securely. The return box has pre-paid postage on it.  Place the return box in the regular [place] Mail box as soon as possible It will take anywhere from 1-2 weeks for your provider to receive and review your results once you mail this back. If for some reason you have misplaced your return  box then call our office and we can provide another box and/or mail it off for you.   Billing  and Patient Assistance Program Information: We have supplied [employer] with any of your insurance information on file for billing purposes. [employer] offers a sliding scale Patient Assistance Program for patients that do not have insurance, or whose insurance does not completely cover the cost of the ZIO monitor. You must apply for the Patient Assistance Program to qualify for this discounted rate. To apply, please call [employer] at [phone], select option 1, ask to apply for the Patient Assistance Program. [employer] will ask your household income, and how many people are in your household. They will quote your out-of-pocket cost based on that information. [employer] will also be able to set up for a 22-month, interest-free payment plan if needed.      Follow-Up: At [facility], you and your health needs are our priority.  As part of our continuing mission to provide you with exceptional heart care, we have created designated Provider Care Teams.  These Care Teams include your primary Cardiologist (physician) and Advanced Practice Providers (APPs -  Physician Assistants and Nurse Practitioners) who all work together to provide you with the care you need, when you  need it.  We recommend signing up for the patient portal called "MyChart".  Sign up information is provided on this After Visit Summary.  MyChart is used to connect with patients for Virtual Visits (Telemedicine).  Patients are able to view lab/test results, encounter notes, upcoming appointments, etc.  Non-urgent messages can be sent to your provider as well.   To learn more about what you can do with MyChart, go to [email or web address].    Your next appointment:   6 week(s)  Provider:   Debbe Odea, MD (ONLY)   Other Instructions N/a     Signed, Debbe Odea, MD  02/22/2023 10:30 AM    [facility]

## 2023-02-22 NOTE — Patient Instructions (Signed)
Medication Instructions:  -  Your physician has recommended you make the following change in your medication:   1) STOP Hydrochlorothiazide (HCTZ)  2) STOP Procardia (Nifedipine) short acting dose  3) START Procardia XL (Nifedipine ER) 60 mg: - take 1 tablet by mouth once daily  4) INCREASE Hydralazine to 100 mg: - take 1 tablet by mouth three times a day  *If you need a refill on your cardiac medications before your next appointment, please call your pharmacy*   Lab Work: - Your physician recommends that you return for lab work in: 5 weeks (around 03/29/23)  BMP  Medical Pharmacologist at [facility] 1st desk on the right to check in (REGISTRATION)  Lab hours: Monday- Friday (7:30 am- 5:30 pm)   If you have labs (blood work) drawn today and your tests are completely normal, you will receive your results only by: MyChart Message (if you have MyChart) OR A paper copy in the mail If you have any lab test that is abnormal or we need to change your treatment, we will call you to review the results.   Testing/Procedures: - Heart Monitor:  Your physician has requested you wear a ZIO XT monitor for 14  days.  Your monitor will be mailed to your home address within 3-5 business days. This is sent via [employer] from [employer]. However, if you have not received your monitor after 5 business days please send [place] a MyChart message or call the office at [phone], so we may follow up on this for you.   This monitor is a medical device (single patch monitor) that records the heart's electrical activity. Doctors most often use these monitors to diagnose arrhythmias. Arrhythmias are problems with the speed or rhythm of the heartbeat.   [employer] supplies 1 patch per enrollment. Additional stickers are not available.  Please DO NOT apply the patch if you will be having a Nuclear Stress Test, Echocardiogram, Cardiac CT, Cardiac MRI, Chest X-ray during the period you would be wearing the  monitor. The patch cannot be worn during these tests.  You cannot remove and re-apply the ZIO patch monitor.   Applying the Monitor: Once you receive your monitor, this will include a small razor, abrader, and 4 alcohol pads. Shave hair from upper left chest Rub abrader disc in 40 strokes over the left upper chest as indicated in your monitor instructions Clean area with 4 enclosed alcohol pads (there may be a mild & brief stinging sensation over the newly abraded area, but this is normal). Let dry Apply patch as indicated in monitor instructions. Patch will be placed under collarbone on the left side of the chest with arrow pointing upward. Rub adhesive wings for 2 minutes. Remove white label marked "1". Remove the white label marked "2". Rub patch adhesive wings for an 2 minutes.  While looking in a mirror, press and release button in the center of the patch. You may hear a "click". A small green light will flash 4-6 times and then stop. This will be your indicator that the monitor has been turned on.  Wearing the Monitor: Avoid showering during the first 24 hours of wearing the monitor.  After 24 hours you may shower with the patch on. Take brief showers with your back facing the shower head.  Avoid excessive sweating to help maximize wear time. Do not submerge the device, no hot tubs, and no swimming pools. Keep any lotions or oils away from the patch. Press the button  if you feel a symptom. You will hear a small click. Record date, time, and symptoms in the Patient Logbook or App.  Monitor Issues: Call [employer] Customer Care at [phone] if you have questions regarding your Zio Patch Monitor. Call them immediately if you see an orange/ amber colored light blinking on your monitor. If your monitor falls off and you cannot get this reapplied or if you need suggestions for securing your monitor call [employer] at [phone].   Returning the Monitor: Once you have  completed wearing your monitor, follow instructions on the last 2 pages of the Patient Logbook. Stick monitor patch on to the last page of the Patient Logbook.  Place Patient Logbook with monitor in the return box provided. Use locking tab on box and tape box closed securely. The return box has pre-paid postage on it.  Place the return box in the regular [place] Mail box as soon as possible It will take anywhere from 1-2 weeks for your provider to receive and review your results once you mail this back. If for some reason you have misplaced your return box then call our office and we can provide another box and/or mail it off for you.   Billing  and Patient Assistance Program Information: We have supplied [employer] with any of your insurance information on file for billing purposes. [employer] offers a sliding scale Patient Assistance Program for patients that do not have insurance, or whose insurance does not completely cover the cost of the ZIO monitor. You must apply for the Patient Assistance Program to qualify for this discounted rate. To apply, please call [employer] at [phone], select option 1, ask to apply for the Patient Assistance Program. [employer] will ask your household income, and how many people are in your household. They will quote your out-of-pocket cost based on that information. [employer] will also be able to set up for a 3-month, interest-free payment plan if needed.      Follow-Up: At [facility], you and your health needs are our priority.  As part of our continuing mission to provide you with exceptional heart care, we have created designated Provider Care Teams.  These Care Teams include your primary Cardiologist (physician) and Advanced Practice Providers (APPs -  Physician Assistants and Nurse Practitioners) who all work together to provide you with the care you need, when you need it.  We recommend signing up for the patient portal called "MyChart".  Sign up information  is provided on this After Visit Summary.  MyChart is used to connect with patients for Virtual Visits (Telemedicine).  Patients are able to view lab/test results, encounter notes, upcoming appointments, etc.  Non-urgent messages can be sent to your provider as well.   To learn more about what you can do with MyChart, go to [email or web address].    Your next appointment:   6 week(s)  Provider:   Debbe Odea, MD (ONLY)   Other Instructions N/a

## 2023-02-25 ENCOUNTER — Encounter: Payer: Self-pay | Admitting: Pain Medicine

## 2023-02-25 ENCOUNTER — Ambulatory Visit: Payer: Medicare Other | Attending: Pain Medicine | Admitting: Pain Medicine

## 2023-02-25 VITALS — BP 162/70 | HR 79 | Temp 97.5°F | Resp 18 | Ht 65.0 in | Wt 282.0 lb

## 2023-02-25 DIAGNOSIS — M25511 Pain in right shoulder: Secondary | ICD-10-CM | POA: Insufficient documentation

## 2023-02-25 DIAGNOSIS — M545 Low back pain, unspecified: Secondary | ICD-10-CM | POA: Diagnosis not present

## 2023-02-25 DIAGNOSIS — G8929 Other chronic pain: Secondary | ICD-10-CM | POA: Diagnosis not present

## 2023-02-25 DIAGNOSIS — M461 Sacroiliitis, not elsewhere classified: Secondary | ICD-10-CM | POA: Diagnosis not present

## 2023-02-25 DIAGNOSIS — M25552 Pain in left hip: Secondary | ICD-10-CM | POA: Diagnosis not present

## 2023-02-25 DIAGNOSIS — M25551 Pain in right hip: Secondary | ICD-10-CM | POA: Insufficient documentation

## 2023-02-25 DIAGNOSIS — M4325 Fusion of spine, thoracolumbar region: Secondary | ICD-10-CM | POA: Diagnosis not present

## 2023-02-25 DIAGNOSIS — M25562 Pain in left knee: Secondary | ICD-10-CM | POA: Diagnosis not present

## 2023-02-25 DIAGNOSIS — M170 Bilateral primary osteoarthritis of knee: Secondary | ICD-10-CM | POA: Diagnosis not present

## 2023-02-25 DIAGNOSIS — C7951 Secondary malignant neoplasm of bone: Secondary | ICD-10-CM

## 2023-02-25 DIAGNOSIS — Z6841 Body Mass Index (BMI) 40.0 and over, adult: Secondary | ICD-10-CM | POA: Diagnosis not present

## 2023-02-25 DIAGNOSIS — M546 Pain in thoracic spine: Secondary | ICD-10-CM

## 2023-02-25 DIAGNOSIS — M25561 Pain in right knee: Secondary | ICD-10-CM | POA: Insufficient documentation

## 2023-02-25 DIAGNOSIS — M961 Postlaminectomy syndrome, not elsewhere classified: Secondary | ICD-10-CM | POA: Diagnosis not present

## 2023-02-25 DIAGNOSIS — M19011 Primary osteoarthritis, right shoulder: Secondary | ICD-10-CM | POA: Diagnosis not present

## 2023-02-25 DIAGNOSIS — M25512 Pain in left shoulder: Secondary | ICD-10-CM | POA: Diagnosis not present

## 2023-02-25 DIAGNOSIS — M19012 Primary osteoarthritis, left shoulder: Secondary | ICD-10-CM | POA: Diagnosis not present

## 2023-02-25 DIAGNOSIS — G96198 Other disorders of meninges, not elsewhere classified: Secondary | ICD-10-CM

## 2023-02-25 DIAGNOSIS — M160 Bilateral primary osteoarthritis of hip: Secondary | ICD-10-CM

## 2023-02-25 NOTE — Progress Notes (Signed)
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 glenohumeral degenerative change.  IMPRESSION: No acute or focal abnormality. Mild degenerative changes left shoulder .   Electronically Signed By: Maisie Fus  Register On: 06/28/2016 06:47  Thoracic Imaging: Thoracic MR wo contrast: Results for orders placed during the hospital encounter of 10/23/21 MR THORACIC SPINE WO CONTRAST  Narrative  CLINICAL DATA:  Back pain radiating to the right and left side pain in both legs  EXAM: MRI THORACIC SPINE WITHOUT CONTRAST  TECHNIQUE: Multiplanar, multisequence MR imaging of the thoracic spine was performed. No intravenous contrast was administered.  COMPARISON:  Thoracic spine MRI 04/12/2021, CT thoracic spine 01/19/2020  FINDINGS: Alignment:  Normal.  Vertebrae: Patient is status post posterior instrumented fusion extending from T10 through the lumbar spine. The  lower hardware is not imaged. Vertebral body heights are preserved. There is no evidence of acute injury.  There is mild T1 hypointensity in the right aspect of the T5 vertebral body, similar to the prior study, though previously STIR hyperintensity has decreased. There is mild degenerative endplate marrow signal abnormality at T8-T9 and T9-T10, similar to the prior study.  Cord: Cord is suboptimally assessed from T11 and below due to significant susceptibility from the adjacent hardware. The upper thoracic cord is normal in signal and morphology.  Paraspinal and other soft tissues: Unremarkable.  Disc levels:  There is mild multilevel disc desiccation and narrowing in the thoracic spine above the surgical levels, most advanced at T8-T9 and T9-T10, similar to the prior study.  There are small disc protrusions at T4-T5, T6-T7, T7-T8, T8-T9, and T9-T10. There is prominent dorsal epidural fat at T4-T5 through T9-T10, most pronounced at T7 where it measures up to 6 mm in thickness. This results in up to moderate spinal canal stenosis at T7-T8, not significantly changed. There is no other significant spinal canal stenosis.  There is multilevel facet arthropathy with moderate to severe bilateral neural foraminal stenosis at T8-T9 and T9-T10, not significantly changed.  IMPRESSION: 1. Postsurgical changes reflecting posterior instrumented fusion extending from T10 into the lumbar spine. The lumbar hardware is not imaged. 2. Mild degenerative endplate marrow signal abnormality at T8-T9 and T9-T10, not significantly changed. 3. Scattered small disc protrusions with prominent dorsal epidural fat resulting in up to moderate spinal canal stenosis at T7-T8, not significantly changed. 4. Moderate to severe bilateral neural foraminal stenosis at T8-T9 and T9-T10, also not significantly changed. 5. No evidence of acute injury in the thoracic spine. 6. Unchanged mild T1 hypointensity in  the T5 vertebral body, favored benign given stability and lack of findings on prior PET-CT.   Electronically Signed By: Lesia Hausen M.D. On: 10/24/2021 13:38  Thoracic CT wo contrast: Results for orders placed during the hospital encounter of 06/22/22 CT THORACIC SPINE WO CONTRAST  Narrative CLINICAL DATA:  Right thoracic pain radiating to the right axilla and anterior chest.  EXAM: CT THORACIC SPINE WITHOUT CONTRAST  TECHNIQUE: Multidetector CT images of the thoracic were obtained using the standard protocol without intravenous contrast.  RADIATION DOSE REDUCTION: This exam was performed according to the departmental dose-optimization program which includes automated exposure control, adjustment of the mA and/or kV according to patient size and/or use of iterative reconstruction technique.  COMPARISON:  02/22/2022  FINDINGS: Alignment: Exaggerated thoracic kyphosis.  No listhesis.  Vertebrae: Vertebral body sclerosis at T8-9 and T9-10. T10 two lumbar fusion with solid arthrodesis. No loosening of hardware. The T10 screws new approach the intervertebral space.  Paraspinal and other soft tissues: Paravertebral edema at T8-9, also present on MRI 03/23/2022.  Disc levels: Generalized disc space narrowing and spurring which progresses inferiorly towards the surgical fusion, especially severe at T7-8 and T8-9 where there is endplate irregularity and sclerosis with disc collapse. Bilateral facet spurring at T8-9 and T9-10 especially, biforaminal impingement at these levels. Dorsal epidural fat expansion encroaches on  the thecal sac from behind.  IMPRESSION: 1. T10 to lumbar fusion with solid arthrodesis. 2. Advanced degeneration of the adjacent levels, especially T8-9 and T9-10 where there is endplate sclerosis and disc collapse. Biforaminal impingement at these levels 3. Notable paravertebral edema at T8-9 which was also seen on prior 02/22/2022, stability suggesting  inflammation rather than infection. 4. Midthoracic dorsal epidural lipomatosis encroaching on the thecal sac, better seen by MRI earlier this year.   Electronically Signed By: Tiburcio Pea M.D. On: 06/25/2022 18:44  Thoracic DG w/swimmers view: Results for orders placed during the hospital encounter of 02/12/23 [facility] Thoracic Spine W/Swimmers  Narrative CLINICAL DATA:  Thoracic radiculopathy. Foraminal stenosis of thoracic region. Chronic bilateral thoracic back pain.  EXAM: THORACIC SPINE - 3 VIEWS  COMPARISON:  CT 06/22/2022  FINDINGS: Thoracolumbar spinal fusion hardware extending from the lumbar spine to the T10 level. The hardware is unchanged were visualized. The T10 pedicle screws again approaches the T9-T10 disc space. Vertebral body/endplate sclerosis at T8-T9 and T9-T10, unchanged from prior CT. Mildly exaggerated thoracic kyphosis again seen. No evidence of compression deformity or fracture. The facet spurring at T8-T9 T9-T10 on prior CT is not well demonstrated by radiograph. There is no paravertebral soft tissue abnormality.  IMPRESSION: 1. Thoracolumbar spinal fusion hardware extending from the lumbar spine to the T10 level, unchanged. 2. Vertebral body/endplate sclerosis at T8-T9 and T9-T10, unchanged from prior CT. 3. The facet spurring at T8-T9 T9-T10 on prior CT is not well demonstrated by radiograph.   Electronically Signed By: Narda Rutherford M.D. On: 02/15/2023 21:14  Lumbosacral Imaging: Lumbar MR wo contrast: Results for orders placed during the hospital encounter of 04/12/21 MR LUMBAR SPINE WO CONTRAST  Narrative CLINICAL DATA:  Acute bilateral low back pain without sciatica. Multiple prior spine surgeries.  EXAM: MRI LUMBAR SPINE WITHOUT CONTRAST  TECHNIQUE: Multiplanar, multisequence MR imaging of the lumbar spine was performed. No intravenous contrast was administered.  COMPARISON:  Lumbar spine CT  01/19/2020  FINDINGS: Segmentation:  Standard.  Alignment:  Normal.  Vertebrae: Sequelae of thoracolumbar fusion are again noted with a posterior pedicle screw and rod construct extending from T10-L3 with lack of screws at L1. Interbody cages are again noted at L1-2 and L3-4, and there are anterior plates and screws related to prior L4-5 and L5-S1 anterior fusions. Screws are again noted traversing the facet joints at L5-S1. Within limitations of susceptibility artifact from hardware, no fracture, suspicious marrow lesion, or significant marrow edema is identified in the lumbar spine.  Conus medullaris and cauda equina: Conus extends to approximately the L1 level, partially obscured by artifact.  Paraspinal and other soft tissues: Postoperative changes in the posterior thoracolumbar soft tissues with extensive paraspinal muscle atrophy bilaterally. Small fluid collection in the laminectomy bed at L3-4.  Disc levels:  T12-L1: Posterior fusion.  No disc herniation or stenosis.  L1-2: Discectomy and fusion with limited assessment of the spinal canal and neural foramina due to artifact from the interbody spacers. The prior CT demonstrated asymmetric posterior positioning of the right-sided interbody spacer resulting in mild right neural foraminal stenosis as well as mild left-sided neural foraminal stenosis due to spurring.  L2-3: Posterior fusion. Mild disc bulging and facet hypertrophy without evidence of significant stenosis.  L3-4: Wide posterior decompression, discectomy, and fusion. No stenosis.  L4-5: Posterior decompression and anterior and posterior fusion. No stenosis.  L5-S1: Anterior and posterior fusion with limited assessment of the spinal canal and neural foramina due to artifact although the neural  foramina appeared patent on the prior CT.  IMPRESSION: Extensive postsurgical changes throughout the lumbar spine as detailed above. No acute finding or  evidence of a compressive stenosis within limitations of artifact.   Electronically Signed By: Sebastian Ache M.D. On: 04/13/2021 12:02  Lumbar CT wo contrast: Results for orders placed during the hospital encounter of 09/01/21 CT LUMBAR SPINE WO CONTRAST  Narrative CLINICAL DATA:  Evaluate spinal stenosis.  Bilateral hip pain.  EXAM: CT LUMBAR SPINE WITHOUT CONTRAST  TECHNIQUE: Multidetector CT imaging of the lumbar spine was performed without intravenous contrast administration. Multiplanar CT image reconstructions were also generated.  COMPARISON:  CT lumbar spine 01/19/2020  FINDINGS: Segmentation: 5 lumbar segments.  Alignment: Normal  Vertebrae: Negative for fracture or mass. Degenerative change in both SI joints with spurring and gas in the joint.  Surgical hardware: Pedicle screw and rod fusion in the thoracic spine extending to the L3 level. Interbody spacers bilaterally at L1-2. The right-sided spacer is positioned in the ventral epidural space similar to the prior study. This is contributing to subarticular stenosis.  Interbody spacer with solid fusion at L3-4. Solid interbody fusion L4-5 and L5-S1. Anterior plate and screws overlying the L4-5 and L5-S1 disc space in good position. Posterior screws across the L5-S1 facet bilaterally.  Paraspinal and other soft tissues: Negative for paraspinous mass, adenopathy, or fluid collection. Atherosclerotic calcification aorta and iliac arteries without aneurysm.  Disc levels: T12-L1: Negative for stenosis.  L1-2: Posterior decompression. Spinal canal decompressed. Subarticular stenosis on the right due to spurring and posteriorly placed spacer. No change from the prior study.  L2-3: Bilateral facet hypertrophy. Mild spinal stenosis. No interval change  L3-4: Solid posterior bony fusion. Posterior decompression. Negative for stenosis  L4-5: Solid interbody fusion. Solid posterior fusion. Negative  for stenosis  L5-S1: Solid interbody fusion. Solid posterior fusion. Screws across the L5-S1 facet joint bilaterally. Negative for stenosis.  IMPRESSION: Thoracic and lumbar fusion as described above. There is no change from the prior CT of 01/19/2020. Again note is made of posterior displacement of the right L1 spacer, unchanged.   Electronically Signed By: Marlan Palau M.D. On: 09/01/2021 17:01  Lumbar DG Bending views: Results for orders placed during the hospital encounter of 02/12/23 DG Lumbar Spine Complete W/Bend  Narrative CLINICAL DATA:  Chronic bilateral low back pain. Failed back surgical syndrome.  EXAM: LUMBAR SPINE - COMPLETE WITH BENDING VIEWS  COMPARISON:  Lumbar spine CT 09/01/2021  FINDINGS: Five non-rib-bearing lumbar vertebra. Thoracolumbar posterior spinal fusion hardware extending from the thoracic spine to the L3 level. Additional hardware at L4-L5. Multiple laminectomies. Intervertebral disc spacer at L1-L2. The right spacer projects posteriorly by approximately 6 mm, grossly stable. L3-L4 interbody spacer in place, normally position. Straightening of normal lordosis without abnormal motion on flexion or extension. No evidence of fracture or compression deformity. There is degenerative change about both sacroiliac joints.  IMPRESSION: 1. Thoracolumbar posterior spinal fusion hardware extending from the thoracic spine to the L3 level. Additional lower lumbar surgical hardware. Hardware is intact. 2. Interbody spacer at L1-L2, right aspect of the spacer projects posteriorly approximately 6 mm. This was seen on prior CT. 3. Straightening of normal lordosis without abnormal motion on flexion or extension.   Electronically Signed By: Narda Rutherford M.D. On: 02/15/2023 21:18  Sacroiliac Joint Imaging: Sacroiliac Joint DG: Results for orders placed during the hospital encounter of 06/27/16 DG Si Joints  Narrative CLINICAL DATA:  Chronic  pain.  No reported injury .  EXAM:  BILATERAL SACROILIAC JOINTS - 3+ VIEW  COMPARISON:  CT 06/01/2016.  MRI 02/08/2016  FINDINGS: Lumbosacral spine fusion. Degenerative changes lumbar spine and both SI joints. Degenerative changes both hips. Osteitis pubis again noted. No acute abnormality identified. Pelvic calcifications consistent phleboliths noted.  IMPRESSION: Lumbosacral spine fusion. Degenerative changes lumbar spine, both SI joints, both hips. Osteitis pubis again noted. No acute abnormality identified.   Electronically Signed By: Maisie Fus  Register On: 06/28/2016 06:53  Hip Imaging: Hip-R DG 2-3 views: Results for orders placed during the hospital encounter of 02/16/19 DG Hip Unilat W or Wo Pelvis 2-3 Views Right  Narrative CLINICAL DATA:  Pain following fall  EXAM: DG HIP (WITH OR WITHOUT PELVIS) 2-3V RIGHT  COMPARISON:  June 27, 2016  FINDINGS: Frontal pelvis as well as frontal and lateral right hip joint images were obtained. No acute fracture or dislocation evident. There is moderate symmetric narrowing of each hip joint. There is evidence of bilateral sacroiliitis as well as osteitis condensans pubis, stable in appearance. There is postoperative change in the visualized lower lumbar spine with extensive arthropathy in the visualized lower lumbar spine.  IMPRESSION: 1. 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 Moderate symmetric narrowing of each hip joint. No acute fracture or dislocation.   Electronically Signed By: Bretta Bang III M.D. On: 02/16/2019 16:45  Hip-L DG 2-3 views: Results for orders placed during the hospital encounter of 06/27/16 DG HIP UNILAT W OR W/O PELVIS 2-3 VIEWS LEFT  Narrative CLINICAL DATA:  Chronic pain.  EXAM: DG HIP (WITH OR WITHOUT PELVIS) 2-3V LEFT  COMPARISON:  05/22/2016.  MRI 02/08/2016.  FINDINGS: Prior lumbosacral spine fusion. Degenerative  changes lumbar spine, both SI joints, both hips. Osteitis pubis again noted.  IMPRESSION: No acute abnormality. Prior lumbosacral spine fusion. Degenerative changes both SI joints and hips. Osteitis pubis again noted.   Electronically Signed By: Maisie Fus  Register On: 06/28/2016 06:50  Ankle Imaging: Ankle-R DG Complete: Results for orders placed in visit on 03/17/19 DG Ankle Complete Right Narrative Please see detailed radiograph report in office note.  Ankle-L DG Complete: Results for orders placed in visit on 05/12/19 DG Ankle Complete Left Narrative Please see detailed radiograph report in office note.  Foot Imaging: Foot-R DG Complete: Results for orders placed in visit on 09/08/19 DG Foot Complete Right Narrative Please see detailed radiograph report in office note.  Foot-L DG Complete: Results for orders placed in visit on 05/12/19 DG Foot Complete Left Narrative Please see detailed radiograph report in office note.  Complexity Note: Imaging results reviewed.                         Meds   Current Outpatient Medications:    acetaminophen (TYLENOL) 500 MG tablet, Take 1,000 mg by mouth every 6 (six) hours as needed for moderate pain. , Disp: , Rfl:    atorvastatin (LIPITOR) 20 MG tablet, Take 1 tablet (20 mg total) by mouth daily., Disp: 30 tablet, Rfl: 3   B Complex Vitamins (B COMPLEX PO), Take 1 tablet by mouth daily as needed., Disp: , Rfl:    cloNIDine (CATAPRES) 0.1 MG tablet, Take 1 tablet (0.1 mg total) by mouth 3 (three) times daily., Disp: 60 tablet, Rfl: 11   diphenhydrAMINE (BENADRYL) 25 MG tablet, Take 25 mg by mouth 2 (two) times daily as needed for allergies., Disp: , Rfl:    estradiol (ESTRACE) 0.1 MG/GM vaginal cream, Place  1 Applicatorful vaginally 2 (two) times a week., Disp: 42.5 g, Rfl: 12   fluticasone (FLONASE) 50 MCG/ACT nasal spray, Place 1 spray into both nostrils daily., Disp: , Rfl: 2   furosemide (LASIX) 20 MG tablet, Take 1 tablet (20  mg total) by mouth daily., Disp: 30 tablet, Rfl: 0   gabapentin (NEURONTIN) 600 MG tablet, Take 600-1,200 mg by mouth 2 (two) times daily. Take 600mg  in the AM and 1,200mg  in the PM, Disp: , Rfl:    hydrALAZINE (APRESOLINE) 100 MG tablet, Take 1 tablet (100 mg total) by mouth 3 (three) times daily., Disp: 270 tablet, Rfl: 1   levothyroxine (SYNTHROID) 25 MCG tablet, Take 25 mcg by mouth every morning., Disp: , Rfl:    Menthol, Topical Analgesic, (ICY HOT BACK EX), Apply 1 application topically 2 (two) times daily as needed (pain)., Disp: , Rfl:    methocarbamol (ROBAXIN) 500 MG tablet, Take 500 mg by mouth 4 (four) times daily as needed for muscle spasms., Disp: , Rfl:    NIFEdipine (PROCARDIA XL/NIFEDICAL XL) 60 MG 24 hr tablet, Take 1 tablet (60 mg total) by mouth daily., Disp: 90 tablet, Rfl: 1   pantoprazole (PROTONIX) 40 MG tablet, Take 80 mg by mouth daily. , Disp: , Rfl: 0   PARoxetine (PAXIL) 30 MG tablet, Take 30 mg by mouth daily., Disp: , Rfl:    potassium chloride SA (K-DUR,KLOR-CON) 20 MEQ tablet, Take 20 mEq by mouth daily., Disp: , Rfl:    vitamin E 180 MG (400 UNITS) capsule, Take 400 Units by mouth daily as needed., Disp: , Rfl:   ROS  Constitutional: Denies any fever or chills Gastrointestinal: No reported hemesis, hematochezia, vomiting, or acute GI distress Musculoskeletal: Denies any acute onset joint swelling, redness, loss of ROM, or weakness Neurological: No reported episodes of acute onset apraxia, aphasia, dysarthria, agnosia, amnesia, paralysis, loss of coordination, or loss of consciousness  Allergies  [name] is allergic to latex, crestor [rosuvastatin calcium], penicillins, tetanus toxoids, and tomato.  PFSH  Drug: Ms. Krasowski  reports no history of drug use. Alcohol:  reports that she does not currently use alcohol. Tobacco:  reports that she quit smoking about 53 years ago. Her smoking use included cigarettes. She has a 1.25 pack-year smoking  history. She has never used smokeless tobacco. Medical:  has a past medical history of Abdominal pain of unknown etiology (02/05/2016), Anxiety, Arthritis, Asthma, Chronic back pain, Complication of anesthesia, Constipation, Depression, Dysphonia, Esophageal dysmotility, Fibromyalgia, Fibromyalgia, GERD (gastroesophageal reflux disease), Glaucoma, Headache, High cholesterol, History of DVT (deep vein thrombosis) (1989), History of hiatal hernia, History of MRSA infection (2011), Hypertension, Hypothyroidism, Irregular heart rate, Low iron, Lumbago, Mild obstructive sleep apnea, Neuropathy, Pelvic pain in female, Peripheral vascular disease (HCC), SBO (small bowel obstruction) (HCC) (06/2016), Seasonal allergies, Short of breath on exertion, Urinary frequency, Vaginal atrophy, and Weakness. Surgical: Ms. Korch  has a past surgical history that includes Abdominal hysterectomy (1978); Nose surgery (2007); Mass excision (Left, 02/20/2013); Esophagogastroduodenoscopy (N/A, 07/15/2013); abdominal adhesions removed; Colonoscopy; Bunionectomy (Left, 2008); Shoulder open rotator cuff repair (Left, 05/05/2014); Esophageal manometry (N/A, 11/22/2014); Lumbar fusion (2014); REMOVAL HARDWARE L4-L5/  BILATERAL LAMINECTOMY L2 - L5 AND FUSION (06-12-2011); Laparoscopic cholecystectomy (01-11-2006); cysto with hydrodistension (N/A, 07/12/2015); Eye surgery (Bilateral); Tonsillectomy; Hardware Removal (N/A, 01/02/2016); Spine surgery; laparoscopy (N/A, 07/06/2016); laparotomy (N/A, 07/06/2016); Back surgery; Colon surgery; Dilation and curettage of uterus; lipoma removal; Exploratory laparotomy; ingrown toenail removal; and Cataract extraction (Bilateral, 2017). Family: family history includes Aneurysm in her  maternal grandmother; Cancer in her daughter, mother, and sister; Colon cancer in her father; Colon polyps in her father; Diabetes in her mother; Hypertension in her brother, brother, father, mother, and sister; Kidney  disease in her brother and mother; Liver cancer in her brother and brother; Osteoarthritis in her mother; Stroke in her sister.  Constitutional Exam  General appearance: Well nourished, well developed, and well hydrated. In no apparent acute distress Vitals:   02/25/23 1053 02/25/23 1100  BP: (!) 170/74 (!) 162/70  Pulse: 79   Resp: 18   Temp: (!) 97.5 F (36.4 C)   TempSrc: Temporal   SpO2: 100%   Weight: 282 lb (127.9 kg)   Height: 5\' 5"  (1.651 m)    BMI Assessment: Estimated body mass index is 46.93 kg/m as calculated from the following:   Height as of this encounter: 5\' 5"  (1.651 m).   Weight as of this encounter: 282 lb (127.9 kg).  BMI interpretation table: BMI level Category Range association with higher incidence of chronic pain  <18 kg/m2 Underweight   18.5-24.9 kg/m2 Ideal body weight   25-29.9 kg/m2 Overweight Increased incidence by 20%  30-34.9 kg/m2 Obese (Class I) Increased incidence by 68%  35-39.9 kg/m2 Severe obesity (Class II) Increased incidence by 136%  >40 kg/m2 Extreme obesity (Class III) Increased incidence by 254%   Patient's current BMI Ideal Body weight  Body mass index is 46.93 kg/m. Ideal body weight: 57 kg (125 lb 10.6 oz) Adjusted ideal body weight: 85.4 kg (188 lb 3.2 oz)   BMI Readings from Last 4 Encounters:  02/25/23 46.93 kg/m  01/28/23 46.93 kg/m  11/28/22 47.13 kg/m  11/22/22 46.93 kg/m   Wt Readings from Last 4 Encounters:  02/25/23 282 lb (127.9 kg)  01/28/23 282 lb (127.9 kg)  11/28/22 283 lb 3.2 oz (128.5 kg)  11/22/22 282 lb (127.9 kg)    Psych/Mental status: Alert, oriented x 3 (person, place, & time)       Eyes: PERLA Respiratory: No evidence of acute respiratory distress  Assessment & Plan  Primary Diagnosis & Pertinent Problem List: The primary encounter diagnosis was Chronic low back pain (1ry area of Pain) (Bilateral) (R>L) w/o sciatica. 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  Visit Diagnosis: 1. Chronic low back pain (1ry area of Pain) (Bilateral) (R>L) w/o sciatica   2. Chronic thoracic back pain (2ry area of Pain) (Bilateral) (R>L)   3. Failed back surgical syndrome (6)   4. Epidural fibrosis   5. Chronic sacroiliitis (HCC) (Bilateral)   6. Fusion of spine, thoracolumbar region (T10-L3)   7. Chronic hip pain (3ry area of Pain) (Bilateral) (R>L)   8. Chronic shoulder pain (Bilateral) (R>L)   9. Chronic knee pain (Bilateral) (R>L)   10. 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 Secondary malignant neoplasm of vertebral column (HCC)   14. Morbid obesity with body mass index of 45.0-49.9 in adult [facility])  Problems updated and reviewed during this visit: Problem  Chronic sacroiliitis (HCC) (Bilateral)   Chronic bilateral sacroiliitis that seems to be present and stable since 2020.   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  Procedure Orders         SACROILIAC JOINT INJECTION     Lab Orders  No laboratory test(s) ordered today   Imaging Orders  No imaging studies ordered today   Referral Orders  No referral(s) requested today    Pharmacological management:  Opioid Analgesics: I will not be prescribing any opioids at this time Membrane stabilizer: I will not be prescribing any at this time Muscle relaxant: I will not be prescribing any at this time NSAID: I will not be prescribing any at this time Other analgesic(s): I will not be prescribing any at this time      Interventional Therapies  Risk Factors  Considerations:  Hx. suicide attempt     Planned  Pending:   Diagnostic bilateral SI joint block #1    Under consideration:   Diagnostic bilateral SI joint block #1  Diagnostic midline caudal ESI #1    Completed:   None at this time   Therapeutic  Palliative (PRN) options:   None established   Completed by other providers:   Therapeutic Nerve Blocks by [facility].      Provider-requested follow-up: Return for (ECT): (B) SI BLK #1. Recent Visits Date Type Provider Dept  01/28/23 Office Visit Delano Metz, MD [facility]  Showing recent visits within past 90 days and meeting all other requirements Today's Visits Date Type Provider Dept  02/25/23 Office Visit Delano Metz, MD [facility]  Showing today's visits and meeting all other requirements Future Appointments No visits were found meeting these conditions. Showing future appointments within next 90 days and meeting all other requirements   Primary Care Physician: [facility]  Duration of encounter: 45 minutes.  Total time on encounter, as per AMA guidelines included both the face-to-face and non-face-to-face time  personally spent by the physician and/or other qualified health care professional(s) on the day of the encounter (includes time in activities that require the physician or other qualified health care professional and does not include time in activities normally performed by clinical staff). Physician's time may include the following activities when performed: Preparing to see the patient (e.g., pre-charting review of records, searching for previously ordered imaging, lab work, and nerve conduction tests) Review of prior analgesic pharmacotherapies. Reviewing PMP Interpreting ordered tests (e.g., lab work, imaging, nerve conduction tests) Performing post-procedure evaluations, including interpretation of diagnostic procedures Obtaining and/or reviewing separately obtained history Performing a medically appropriate examination and/or evaluation Counseling and educating the patient/family/caregiver Ordering medications, tests, or procedures Referring and communicating with other health care professionals (when not separately reported) Documenting clinical information in the electronic or other health record Independently interpreting results (not separately reported) and communicating results to the patient/ family/caregiver Care coordination (  not separately reported)  Note by: Oswaldo Done, MD (TTS technology used. I apologize for any typographical errors that were not detected and corrected.) Date: 02/25/2023; Time: 12:24 PM

## 2023-02-25 NOTE — Patient Instructions (Addendum)
Sacroiliac (SI) Joint Injection Patient Information  Description: The sacroiliac joint connects the scrum (very low back and tailbone) to the ilium (a pelvic bone which also forms half of the hip joint).  Normally this joint experiences very little motion.  When this joint becomes inflamed or unstable low back and or hip and pelvis pain may result.  Injection of this joint with local anesthetics (numbing medicines) and steroids can provide diagnostic information and reduce pain.  This injection is performed with the aid of x-ray guidance into the tailbone area while you are lying on your stomach.   You may experience an electrical sensation down the leg while this is being done.  You may also experience numbness.  We also may ask if we are reproducing your normal pain during the injection.  Conditions which may be treated SI injection:  Low back, buttock, hip or leg pain  Preparation for the Injection:  Do not eat any solid food or dairy products within 8 hours of your appointment.  You may drink clear liquids up to 3 hours before appointment.  Clear liquids include water, black coffee, juice or soda.  No milk or cream please. You may take your regular medications, including pain medications with a sip of water before your appointment.  Diabetics should hold regular insulin (if take separately) and take 1/2 normal NPH dose the morning of the procedure.  Carry some sugar containing items with you to your appointment. A driver must accompany you and be prepared to drive you home after your procedure. Bring all of your current medications with you. An IV may be inserted and sedation may be given at the discretion of the physician. A blood pressure cuff, EKG and other monitors will often be applied during the procedure.  Some patients may need to have extra oxygen administered for a short period.  You will be asked to provide medical information, including your allergies, prior to the procedure.  We  must know immediately if you are taking blood thinners (like Coumadin/Warfarin) or if you are allergic to IV iodine contrast (dye).  We must know if you could possible be pregnant.  Possible side effects:  Bleeding from needle site Infection (rare, may require surgery) Nerve injury (rare) Numbness & tingling (temporary) A brief convulsion or seizure Light-headedness (temporary) Pain at injection site (several days) Decreased blood pressure (temporary) Weakness in the leg (temporary)   Call if you experience:  New onset weakness or numbness of an extremity below the injection site that last more than 8 hours. Hives or difficulty breathing ( go to the emergency room) Inflammation or drainage at the injection site Any new symptoms which are concerning to you  Please note:  Although the local anesthetic injected can often make your back/ hip/ buttock/ leg feel good for several hours after the injections, the pain will likely return.  It takes 3-7 days for steroids to work in the sacroiliac area.  You may not notice any pain relief for at least that one week.  If effective, we will often do a series of three injections spaced 3-6 weeks apart to maximally decrease your pain.  After the initial series, we generally will wait some months before a repeat injection of the same type.  If you have any questions, please call [phone] [facility]   ______________________________________________________________________  Procedure instructions  Do not eat or drink fluids (other than water) for 6 hours before your procedure  No water for  2 hours before your procedure  Take your blood pressure medicine with a sip of water  Arrive 30 minutes before your appointment  Carefully read the "Preparing for your procedure" detailed instructions  If you have questions call us at [phone]  [phone]  _____________________________________________________________________    ______________________________________________________________________  Preparing for your procedure  Appointments: If you think you may not be able to keep your appointment, call 24-48 hours in advance to cancel. We need time to make it available to others.  During your procedure appointment there will be: No Prescription Refills. No disability issues to discussed. No medication changes or discussions.  Instructions: Food intake: Avoid eating anything solid for at least 8 hours prior to your procedure. Clear liquid intake: You may take clear liquids such as water up to 2 hours prior to your procedure. (No carbonated drinks. No soda.) Transportation: Unless otherwise stated by your physician, bring a driver. Morning Medicines: Except for blood thinners, take all of your other morning medications with a sip of water. Make sure to take your heart and blood pressure medicines. If your blood pressure's lower number is above 100, the case will be rescheduled. Blood thinners: Make sure to stop your blood thinners as instructed.  If you take a blood thinner, but were not instructed to stop it, call our office [phone] and ask to talk to a nurse. Not stopping a blood thinner prior to certain procedures could lead to serious complications. Diabetics on insulin: Notify the staff so that you can be scheduled 1st case in the morning. If your diabetes requires high dose insulin, take only  of your normal insulin dose the morning of the procedure and notify the staff that you have done so. Preventing infections: Shower with an antibacterial soap the morning of your procedure.  Build-up your immune system: Take 1000 mg of Vitamin C with every meal (3 times a day) the day prior to your procedure. Antibiotics: Inform the nursing staff if you are taking any antibiotics or if you have any conditions that may require  antibiotics prior to procedures. (Example: recent joint implants)   Pregnancy: If you are pregnant make sure to notify the nursing staff. Not doing so may result in injury to the fetus, including death.  Sickness: If you have a cold, fever, or any active infections, call and cancel or reschedule your procedure. Receiving steroids while having an infection may result in complications. Arrival: You must be in the facility at least 30 minutes prior to your scheduled procedure. Tardiness: Your scheduled time is also the cutoff time. If you do not arrive at least 15 minutes prior to your procedure, you will be rescheduled.  Children: Do not bring any children with you. Make arrangements to keep them home. Dress appropriately: There is always a possibility that your clothing may get soiled. Avoid long dresses. Valuables: Do not bring any jewelry or valuables.  Reasons to call and reschedule or cancel your procedure: (Following these recommendations will minimize the risk of a serious complication.) Surgeries: Avoid having procedures within 2 weeks of any surgery. (Avoid for 2 weeks before or after any surgery). Flu Shots: Avoid having procedures within 2 weeks of a flu shots or . (Avoid for 2 weeks before or after immunizations). Barium: Avoid having a procedure within 7-10 days after having had a radiological study involving the use of radiological contrast. (Myelograms, Barium swallow or enema study). Heart attacks: Avoid any elective procedures or surgeries for the initial 6 months after a "  Myocardial Infarction" (Heart Attack). Blood thinners: It is imperative that you stop these medications before procedures. Let us know if you if you take any blood thinner.  Infection: Avoid procedures during or within two weeks of an infection (including chest colds or gastrointestinal problems). Symptoms associated with infections include: Localized redness, fever, chills, night sweats or profuse sweating, burning  sensation when voiding, cough, congestion, stuffiness, runny nose, sore throat, diarrhea, nausea, vomiting, cold or Flu symptoms, recent or current infections. It is specially important if the infection is over the area that we intend to treat. Heart and lung problems: Symptoms that may suggest an active cardiopulmonary problem include: cough, chest pain, breathing difficulties or shortness of breath, dizziness, ankle swelling, uncontrolled high or unusually low blood pressure, and/or palpitations. If you are experiencing any of these symptoms, cancel your procedure and contact your primary care physician for an evaluation.  Remember:  Regular Business hours are:  Monday to Thursday 8:00 AM to 4:00 PM  Provider's Schedule: Delano Metz, MD:  Procedure days: Tuesday and Thursday 7:30 AM to 4:00 PM  Edward Jolly, MD:  Procedure days: Monday and Wednesday 7:30 AM to 4:00 PM  ______________________________________________________________________    ____________________________________________________________________________________________  General Risks and Possible Complications  Patient Responsibilities: It is important that you read this as it is part of your informed consent. It is our duty to inform you of the risks and possible complications associated with treatments offered to you. It is your responsibility as a patient to read this and to ask questions about anything that is not clear or that you believe was not covered in this document.  Patient's Rights: You have the right to refuse treatment. You also have the right to change your mind, even after initially having agreed to have the treatment done. However, under this last option, if you wait until the last second to change your mind, you may be charged for the materials used up to that point.  Introduction: Medicine is not an Visual merchandiser. Everything in Medicine, including the lack of treatment(s), carries the potential for  danger, harm, or loss (which is by definition: Risk). In Medicine, a complication is a secondary problem, condition, or disease that can aggravate an already existing one. All treatments carry the risk of possible complications. The fact that a side effects or complications occurs, does not imply that the treatment was conducted incorrectly. It must be clearly understood that these can happen even when everything is done following the highest safety standards.  No treatment: You can choose not to proceed with the proposed treatment alternative. The "PRO(s)" would include: avoiding the risk of complications associated with the therapy. The "CON(s)" would include: not getting any of the treatment benefits. These benefits fall under one of three categories: diagnostic; therapeutic; and/or palliative. Diagnostic benefits include: getting information which can ultimately lead to improvement of the disease or symptom(s). Therapeutic benefits are those associated with the successful treatment of the disease. Finally, palliative benefits are those related to the decrease of the primary symptoms, without necessarily curing the condition (example: decreasing the pain from a flare-up of a chronic condition, such as incurable terminal cancer).  General Risks and Complications: These are associated to most interventional treatments. They can occur alone, or in combination. They fall under one of the following six (6) categories: no benefit or worsening of symptoms; bleeding; infection; nerve damage; allergic reactions; and/or death. No benefits or worsening of symptoms: In Medicine there are no guarantees, only probabilities. No healthcare  provider can ever guarantee that a medical treatment will work, they can only state the probability that it may. Furthermore, there is always the possibility that the condition may worsen, either directly, or indirectly, as a consequence of the treatment. Bleeding: This is more common if  the patient is taking a blood thinner, either prescription or over the counter (example: Goody Powders, Fish oil, Aspirin, Garlic, etc.), or if suffering a condition associated with impaired coagulation (example: Hemophilia, cirrhosis of the liver, low platelet counts, etc.). However, even if you do not have one on these, it can still happen. If you have any of these conditions, or take one of these drugs, make sure to notify your treating physician. Infection: This is more common in patients with a compromised immune system, either due to disease (example: diabetes, cancer, human immunodeficiency virus [HIV], etc.), or due to medications or treatments (example: therapies used to treat cancer and rheumatological diseases). However, even if you do not have one on these, it can still happen. If you have any of these conditions, or take one of these drugs, make sure to notify your treating physician. Nerve Damage: This is more common when the treatment is an invasive one, but it can also happen with the use of medications, such as those used in the treatment of cancer. The damage can occur to small secondary nerves, or to large primary ones, such as those in the spinal cord and brain. This damage may be temporary or permanent and it may lead to impairments that can range from temporary numbness to permanent paralysis and/or brain death. Allergic Reactions: Any time a substance or material comes in contact with our body, there is the possibility of an allergic reaction. These can range from a mild skin rash (contact dermatitis) to a severe systemic reaction (anaphylactic reaction), which can result in death. Death: In general, any medical intervention can result in death, most of the time due to an unforeseen complication. ____________________________________________________________________________________________     _________________________________________________________________________________________  Body mass index (BMI) Weight Management Required  URGENT: Dear Amy [name] your weight has been found to be adversely affecting your health. Aggressive action is immediately required. Talk to your primary care physician.  Body mass index (BMI) is a common tool for deciding whether a person has an appropriate body weight.  It measures a persons weight in relation to their height.   According to the [facility] of health (NIH): A BMI of less than 18.5 means that a person is underweight. A BMI of between 18.5 and 24.9 is ideal. A BMI of between 25 and 29.9 is overweight. A BMI over 30 indicates obesity.  Body Mass Index (BMI) Classification BMI level (kg/m2) Category Associated incidence of chronic pain  <18  Underweight   18.5-24.9 Ideal body weight   25-29.9 Overweight  20%  30-34.9 Obese (Class I)  68%  35-39.9 Severe obesity (Class II)  136%  >40 Extreme obesity (Class III)  254%   Your current Estimated body mass index is 46.93 kg/m as calculated from the following:   Height as of this encounter: 5\' 5"  (1.651 m).   Weight as of this encounter: 282 lb (127.9 kg).  Morbidly Obese Classification: You will be considered to be "Morbidly Obese" if your BMI is above 30 and you have one or more of the following conditions caused or associated to obesity: 1.    Type 2 Diabetes (Leading to cardiovascular diseases (CVD), stroke, peripheral vascular diseases (PVD), retinopathy, nephropathy, and neuropathy) 2.  Cardiovascular Disease (High Blood Pressure; Congestive Heart Failure; High Cholesterol; Coronary Artery Disease; Angina; Arrhythmias, Dysrhythmias, or Heart Attacks) 3.    Breathing problems (Asthma; obesity-hypoventilation syndrome; obstructive sleep apnea; chronic inflammatory airway disease; reactive airway disease; or shortness of breath) 4.    Chronic kidney disease 5.     Liver disease (nonalcoholic fatty liver disease) 6.    High blood pressure 7.    Acid reflux (gastroesophageal reflux disease; heartburn) 8.    Osteoarthritis (OA) (affecting the hip(s), the knee(s) and/or the lower back) 9.    Low back pain (Lumbar Facet Syndrome; and/or Degenerative Disc Disease) 10.  Hip pain (Osteoarthritis of hip) (For every 1 lbs of added body weight, there is a 2 lbs increase in pressure inside of each hip articulation. 1:2 mechanical relationship) 11.  Knee pain (Osteoarthritis of knee) (For every 1 lbs of added body weight, there is a 4 lbs increase in pressure inside of each knee articulation. 1:4 mechanical relationship) (patients with a BMI>30 kg/m2 were 6.8 times more likely to develop knee OA than normal-weight individuals) 12.  Cancer: Epidemiological studies have shown that obesity is a risk factor for: post-menopausal breast cancer; cancers of the endometrium, colon and kidney cancer; malignant adenomas of the oesophagus. Obese subjects have an approximately 1.5-3.5-fold increased risk of developing these cancers compared with normal-weight subjects, and it has been estimated that between 15 and 45% of these cancers can be attributed to overweight. More recent studies suggest that obesity may also increase the risk of other types of cancer, including pancreatic, hepatic and gallbladder cancer. (Ref: Obesity and cancer. Pischon T, Nthlings U, [employer] H. Proc Nutr Soc. 2008 May;67(2):128-45. doi: 10.1017/S0029665108006976.) The International Agency for Research on Cancer (IARC) has identified 13 cancers associated with overweight and obesity: meningioma, multiple myeloma, adenocarcinoma of the esophagus, and cancers of the thyroid, postmenopausal breast cancer, gallbladder, stomach, liver, pancreas, kidney, ovaries, uterus, colon and rectal (colorectal) cancers. 55 percent of all cancers diagnosed in women and 24 percent of those diagnosed in men are associated with overweight  and obesity.  Recommendation: At this point it is urgent that you take a step back and concentrate in loosing weight. Dedicate 100% of your efforts on this task. Nothing else will improve your health more than bringing your weight down and your BMI to less than 30. If you are here, you probably have chronic pain. We know that most chronic pain patients have difficulty exercising secondary to their pain. For this reason, you must rely on proper nutrition and diet in order to lose the weight. If your BMI is above 40, you should seriously consider bariatric surgery. A realistic goal is to lose 10% of your body weight over a period of 12 months.  Be honest to yourself, if over time you have unsuccessfully tried to lose weight, then it is time for you to seek professional help and to enter a medically supervised weight management program, and/or undergo bariatric surgery. Stop procrastinating.   Pain management considerations:  1.    Pharmacological Problems: Be advised that the use of opioid analgesics (oxycodone; hydrocodone; morphine; methadone; codeine; and all of their derivatives) have been associated with decreased metabolism and weight gain.  For this reason, should we see that you are unable to lose weight while taking these medications, it may become necessary for [place] to taper down and indefinitely discontinue them.  2.    Technical Problems: The incidence of successful interventional therapies decreases as the patient's BMI increases. It  is much more difficult to accomplish a safe and effective interventional therapy on a patient with a BMI above 35. 3.    Radiation Exposure Problems: The x-rays machine, used to accomplish injection therapies, will automatically increase their x-ray output in order to capture an appropriate bone image. This means that radiation exposure increases exponentially with the patient's BMI. (The higher the BMI, the higher the radiation exposure.) Although the level of radiation  used at a given time is still safe to the patient, it is not for the physician and/or assisting staff. Unfortunately, radiation exposure is accumulative. Because physicians and the staff have to do procedures and be exposed on a daily basis, this can result in health problems such as cancer and radiation burns. Radiation exposure to the staff is monitored by the radiation batches that they wear. The exposure levels are reported back to the staff on a quarterly basis. Depending on levels of exposure, physicians and staff may be obligated by law to decrease this exposure. This means that they have the right and obligation to refuse providing therapies where they may be overexposed to radiation. For this reason, physicians may decline to offer therapies such as radiofrequency ablation or implants to patients with a BMI above 40. 4.    Current Trends: Be advised that the current trend is to no longer offer certain therapies to patients with a BMI equal to, or above 35, due to increase perioperative risks, increased technical procedural difficulties, and excessive radiation exposure to healthcare personnel.  _________________________________________________________________________________________

## 2023-02-26 DIAGNOSIS — R002 Palpitations: Secondary | ICD-10-CM | POA: Diagnosis not present

## 2023-02-28 ENCOUNTER — Telehealth: Payer: Self-pay | Admitting: Cardiology

## 2023-02-28 NOTE — Telephone Encounter (Signed)
Spoke to patient and informed her that the ZIO monitor does not administer or remove anything from the body it only measure heart rhythm. Informed her that her particular monitor is to be worn for 14 days. Reassured patient that everybody experiences palpitations at some point in their lives. In most circumstances it is simply a heightened awareness of the heart's normal beating. Rapid heart rate: Patients with anxiety or stress may describe a 'rapid heartbeat' or 'pounding' in their chest from their heart beating. Patient understood with read back

## 2023-02-28 NOTE — Telephone Encounter (Signed)
  Pt said, since she she wore her heart monitor she feels tired and she can hear her heart beating. She wanted to know how long she need to be wearing it

## 2023-03-11 NOTE — Progress Notes (Unsigned)
PROVIDER NOTE: Interpretation of information contained herein should be left to medically-trained personnel. Specific patient instructions are provided elsewhere under "Patient Instructions" section of medical record. This document was created in part using STT-dictation technology, any transcriptional errors that may result from this process are unintentional.  Patient: Amy [name] Type: Established DOB: 10-15-1945 MRN: [id number] PCP: [facility]  Service: Procedure DOS: 03/12/2023 Setting: Ambulatory Location: Ambulatory outpatient facility Delivery: Face-to-face Provider: Oswaldo Done, MD Specialty: Interventional Pain Management Specialty designation: 09 Location: Outpatient facility Ref. Prov.: Pa, [facility]       Interventional Therapy   Procedure: Sacroiliac Joint Steroid Injection #1    Laterality: Bilateral     Level: PSIS (Posterior Superior Iliac Spine)  Imaging: Fluoroscopic guidance Anesthesia: Local anesthesia (1-2% Lidocaine) Anxiolysis: IV Versed         Sedation: Moderate Sedation                       DOS: 03/12/2023  Performed by: Oswaldo Done, MD  Purpose: Diagnostic/Therapeutic Indications: Sacroiliac joint pain in the lower back and hip area severe enough to impact quality of life or function. Rationale (medical necessity): procedure needed and proper for the diagnosis and/or treatment of Amy [name]'s medical symptoms and needs. 1. Chronic low back pain (1ry area of Pain) (Bilateral) (R>L) w/o sciatica   2. Chronic sacroiliitis (HCC) (Bilateral)   3. Chronic sacroiliac joint pain (Bilateral)   4. Sacroiliac joint disease (Bilateral)   5. DDD (degenerative disc disease), lumbosacral   6. Failed back surgical syndrome (6)    NAS-11 Pain score:   Pre-procedure: 7 /10   Post-procedure: 0-No pain/10     Target: Interarticular sacroiliac joint. Location: Medial to the postero-medial edge of  iliac spine. Region: Lumbosacral-sacrococcygeal. Approach: Inferior postero-medial percutaneous approach. Type of procedure: Percutaneous joint injection.  Position / Prep / Materials:  Position: Prone  Prep solution: DuraPrep (Iodine Povacrylex [0.7% available iodine] and Isopropyl Alcohol, 74% w/w) Prep Area: Entire posterior lumbosacral area  Materials:  Tray: Block Needle(s):  Type: Spinal  Gauge (G): 22  Length: 7-in Qty: 2  Pre-op H&P Assessment:  Amy [name] is a 77 y.o. (year old), female patient, seen today for interventional treatment. She  has a past surgical history that includes Abdominal hysterectomy (1978); Nose surgery (2007); Mass excision (Left, 02/20/2013); Esophagogastroduodenoscopy (N/A, 07/15/2013); abdominal adhesions removed; Colonoscopy; Bunionectomy (Left, 2008); Shoulder open rotator cuff repair (Left, 05/05/2014); Esophageal manometry (N/A, 11/22/2014); Lumbar fusion (2014); REMOVAL HARDWARE L4-L5/  BILATERAL LAMINECTOMY L2 - L5 AND FUSION (06-12-2011); Laparoscopic cholecystectomy (01-11-2006); cysto with hydrodistension (N/A, 07/12/2015); Eye surgery (Bilateral); Tonsillectomy; Hardware Removal (N/A, 01/02/2016); Spine surgery; laparoscopy (N/A, 07/06/2016); laparotomy (N/A, 07/06/2016); Back surgery; Colon surgery; Dilation and curettage of uterus; lipoma removal; Exploratory laparotomy; ingrown toenail removal; and Cataract extraction (Bilateral, 2017). Amy [name] has a current medication list which includes the following prescription(s): acetaminophen, atorvastatin, b complex vitamins, clonidine, diphenhydramine, estradiol, fluticasone, furosemide, gabapentin, hydralazine, levothyroxine, losartan, menthol (topical analgesic), methocarbamol, nifedipine, pantoprazole, paroxetine, potassium chloride er, potassium chloride sa, and vitamin e, and the following Facility-Administered Medications: fentanyl. Her primarily concern today is the No chief complaint on  file.  Initial Vital Signs:  Pulse/HCG Rate: (!) 104ECG Heart Rate: 95 Temp: (!) 97.4 F (36.3 C) Resp: 19 BP: (!) 158/91 (lower arm) SpO2: 100 %  BMI: Estimated body mass index is 46.93 kg/m as calculated from the following:   Height as of this encounter: 5\' 5"  (  1.651 m).   Weight as of this encounter: 282 lb (127.9 kg).  Risk Assessment: Allergies: Reviewed. She is allergic to latex, crestor [rosuvastatin calcium], penicillins, tetanus toxoids, and tomato.  Allergy Precautions: None required Coagulopathies: Reviewed. None identified.  Blood-thinner therapy: None at this time Active Infection(s): Reviewed. None identified. Amy [name] is afebrile  Site Confirmation: Amy [name] was asked to confirm the procedure and laterality before marking the site Procedure checklist: Completed Consent: Before the procedure and under the influence of no sedative(s), amnesic(s), or anxiolytics, the patient was informed of the treatment options, risks and possible complications. To fulfill our ethical and legal obligations, as recommended by the American Medical Association's Code of Ethics, I have informed the patient of my clinical impression; the nature and purpose of the treatment or procedure; the risks, benefits, and possible complications of the intervention; the alternatives, including doing nothing; the risk(s) and benefit(s) of the alternative treatment(s) or procedure(s); and the risk(s) and benefit(s) of doing nothing. The patient was provided information about the general risks and possible complications associated with the procedure. These may include, but are not limited to: failure to achieve desired goals, infection, bleeding, organ or nerve damage, allergic reactions, paralysis, and death. In addition, the patient was informed of those risks and complications associated to the procedure, such as failure to decrease pain; infection; bleeding; organ or nerve damage with  subsequent damage to sensory, motor, and/or autonomic systems, resulting in permanent pain, numbness, and/or weakness of one or several areas of the body; allergic reactions; (i.e.: anaphylactic reaction); and/or death. Furthermore, the patient was informed of those risks and complications associated with the medications. These include, but are not limited to: allergic reactions (i.e.: anaphylactic or anaphylactoid reaction(s)); adrenal axis suppression; blood sugar elevation that in diabetics may result in ketoacidosis or comma; water retention that in patients with history of congestive heart failure may result in shortness of breath, pulmonary edema, and decompensation with resultant heart failure; weight gain; swelling or edema; medication-induced neural toxicity; particulate matter embolism and blood vessel occlusion with resultant organ, and/or nervous system infarction; and/or aseptic necrosis of one or more joints. Finally, the patient was informed that Medicine is not an exact science; therefore, there is also the possibility of unforeseen or unpredictable risks and/or possible complications that may result in a catastrophic outcome. The patient indicated having understood very clearly. We have given the patient no guarantees and we have made no promises. Enough time was given to the patient to ask questions, all of which were answered to the patient's satisfaction. Amy [name] has indicated that she wanted to continue with the procedure. Attestation: I, the ordering provider, attest that I have discussed with the patient the benefits, risks, side-effects, alternatives, likelihood of achieving goals, and potential problems during recovery for the procedure that I have provided informed consent. Date  Time: 03/12/2023  9:00 AM  Pre-Procedure Preparation:  Monitoring: As per clinic protocol. Respiration, ETCO2, SpO2, BP, heart rate and rhythm monitor placed and checked for adequate  function Safety Precautions: Patient was assessed for positional comfort and pressure points before starting the procedure. Time-out: I initiated and conducted the "Time-out" before starting the procedure, as per protocol. The patient was asked to participate by confirming the accuracy of the "Time Out" information. Verification of the correct person, site, and procedure were performed and confirmed by me, the nursing staff, and the patient. "Time-out" conducted as per Joint Commission's Universal Protocol (UP.01.01.01). Time: 1031 Start Time: 1031 hrs.  Description/Narrative of  Procedure:          Start Time: 1031 hrs.  Rationale (medical necessity): procedure needed and proper for the diagnosis and/or treatment of the patient's medical symptoms and needs. Procedural Technique Safety Precautions: Aspiration looking for blood return was conducted prior to all injections. At no point did we inject any substances, as a needle was being advanced. No attempts were made at seeking any paresthesias. Safe injection practices and needle disposal techniques used. Medications properly checked for expiration dates. SDV (single dose vial) medications used. Description of the Procedure: Protocol guidelines were followed. The patient was assisted into a comfortable position. The target area was identified and the area prepped in the usual manner. Skin & deeper tissues infiltrated with local anesthetic. Appropriate amount of time allowed to pass for local anesthetics to take effect. The procedure needles were then advanced to the target area. Proper needle placement secured. Negative aspiration confirmed. Solution injected in intermittent fashion, asking for systemic symptoms every 0.5cc of injectate. The needles were then removed and the area cleansed, making sure to leave some of the prepping solution back to take advantage of its long term bactericidal properties.  Technical description of procedure:  Fluoroscopy  using a posterior anterior 45 degree angle from the midline aiming at the anterolateral aspect of the patient was used to find a direct path into the sacroiliac joint, the superior medial to posterior superior iliac spine.  The skin was marked where the desired target and the skin infiltrated with local anesthetics.  The procedure needle was then advanced until the joint was entered.  Once inside of the joint, we then proceeded to inject the desired solution.  Vitals:   03/12/23 1037 03/12/23 1046 03/12/23 1056 03/12/23 1106  BP: (!) 144/85 (!) 162/86 138/79 (!) 121/96  Pulse: 96     Resp: 16 14 16 16   Temp:      TempSrc:      SpO2: 99% 93% 96% 96%  Weight:      Height:       End Time: 1036 hrs.  Imaging Guidance (Non-Spinal):          Type of Imaging Technique: Fluoroscopy Guidance (Non-Spinal) Indication(s): Assistance in needle guidance and placement for procedures requiring needle placement in or near specific anatomical locations not easily accessible without such assistance. Exposure Time: Please see nurses notes. Contrast: None used. Fluoroscopic Guidance: I was personally present during the use of fluoroscopy. "Tunnel Vision Technique" used to obtain the best possible view of the target area. Parallax error corrected before commencing the procedure. "Direction-depth-direction" technique used to introduce the needle under continuous pulsed fluoroscopy. Once target was reached, antero-posterior, oblique, and lateral fluoroscopic projection used confirm needle placement in all planes. Images permanently stored in EMR. Interpretation: No contrast injected. I personally interpreted the imaging intraoperatively. Adequate needle placement confirmed in multiple planes. Permanent images saved into the patient's record.      Post-operative Assessment:  Post-procedure Vital Signs:  Pulse/HCG Rate: 9691 Temp: (!) 97.4 F (36.3 C) Resp: 16 BP: (!) 121/96 SpO2: 96 %  EBL:  None  Complications: No immediate post-treatment complications observed by team, or reported by patient.  Note: The patient tolerated the entire procedure well. A repeat set of vitals were taken after the procedure and the patient was kept under observation following institutional policy, for this type of procedure. Post-procedural neurological assessment was performed, showing return to baseline, prior to discharge. The patient was provided with post-procedure discharge instructions, including a section  on how to identify potential problems. Should any problems arise concerning this procedure, the patient was given instructions to immediately contact us, at any time, without hesitation. In any case, we plan to contact the patient by telephone for a follow-up status report regarding this interventional procedure.  Comments:  No additional relevant information.  Plan of Care (POC)  Orders:  Orders Placed This Encounter  Procedures   SACROILIAC JOINT INJECTION    Scheduling Instructions:     Side: Bilateral     Sedation: Patient's choice.     Timeframe: Today    Order Specific Question:   Where will this procedure be performed?    Answer:   [facility]   DG [facility] C-ARM 1-60 MIN NO REPORT    Intraoperative interpretation by procedural physician at [facility].    Standing Status:   Standing    Number of Occurrences:   1    Order Specific Question:   Reason for exam:    Answer:   Assistance in needle guidance and placement for procedures requiring needle placement in or near specific anatomical locations not easily accessible without such assistance.   Informed Consent Details: Physician/Practitioner Attestation; Transcribe to consent form and obtain patient signature    Nursing Order: Transcribe to consent form and obtain patient signature. Note: Always confirm laterality of pain with Amy [name], before procedure.    Order Specific Question:    Physician/Practitioner attestation of informed consent for procedure/surgical case    Answer:   I, the physician/practitioner, attest that I have discussed with the patient the benefits, risks, side effects, alternatives, likelihood of achieving goals and potential problems during recovery for the procedure that I have provided informed consent.    Order Specific Question:   Procedure    Answer:   Sacroiliac Joint Block    Order Specific Question:   Physician/Practitioner performing the procedure    Answer:   Dijon Cosens A. Laban Emperor, MD    Order Specific Question:   Indication/Reason    Answer:   Chronic Low Back and Hip Pain secondary to Sacroiliac Joint Pain (Arthralgia/Arthropathy)   Provide equipment / supplies at bedside    Procedure tray: "Block Tray" (Disposable  single use) Skin infiltration needle: Regular 1.5-in, 25-G, (x1) Block Needle type: Spinal Amount/quantity: 2 Size: Medium (5-inch) Gauge: 22G    Standing Status:   Standing    Number of Occurrences:   1    Order Specific Question:   Specify    Answer:   Block Tray   Follow-up    Schedule Amy [name] for a post-procedure follow-up evaluation encounter 2 weeks from now.    Standing Status:   Future    Standing Expiration Date:   03/26/2023    Scheduling Instructions:     Schedule follow-up visit on afternoon of procedure day (T, Th)     Type: Face-to-face (F2F) Post-procedure (PP) evaluation (E/M)     When: 2 weeks from now   Chronic Opioid Analgesic:  None MME/day: 0 mg/day   Medications ordered for procedure: Meds ordered this encounter  Medications   lidocaine (XYLOCAINE) 2 % (with pres) injection 400 mg   pentafluoroprop-tetrafluoroeth (GEBAUERS) aerosol   lactated ringers infusion   midazolam (VERSED) 5 MG/5ML injection 0.5-2 mg    Make sure Flumazenil is available in the pyxis when using this medication. If oversedation occurs, administer 0.2 mg IV over 15 sec. If after 45 sec no response, administer 0.2  mg again over 1 min;  may repeat at 1 min intervals; not to exceed 4 doses (1 mg)   fentaNYL (SUBLIMAZE) injection 25-50 mcg    Make sure Narcan is available in the pyxis when using this medication. In the event of respiratory depression (RR< 8/min): Titrate NARCAN (naloxone) in increments of 0.1 to 0.2 mg IV at 2-3 minute intervals, until desired degree of reversal.   ropivacaine (PF) 2 mg/mL (0.2%) (NAROPIN) injection 9 mL   methylPREDNISolone acetate (DEPO-MEDROL) injection 80 mg   Medications administered: We administered lidocaine, pentafluoroprop-tetrafluoroeth, lactated ringers, midazolam, fentaNYL, ropivacaine (PF) 2 mg/mL (0.2%), and methylPREDNISolone acetate.  See the medical record for exact dosing, route, and time of administration.  Follow-up plan:   Return in about 2 weeks (around 03/26/2023) for Proc-day (T,Th), (Face2F), (PPE).       Interventional Therapies  Risk Factors  Considerations:  Hx. suicide attempt  Lumbar Post-Laminectomy Syndrome x6     Thoracolumbar HARDWARE (NO RFA)     Planned  Pending:   Diagnostic bilateral SI joint block #1 (03/12/2023)    Under consideration:   Diagnostic bilateral SI joint block #1  Diagnostic midline caudal ESI #1    Completed:   None at this time   Therapeutic  Palliative (PRN) options:   None established   Completed by other providers:   Therapeutic Nerve Blocks by [facility].       Recent Visits Date Type Provider Dept  02/25/23 Office Visit Delano Metz, MD [facility]  01/28/23 Office Visit Delano Metz, MD [facility]  Showing recent visits within past 90 days and meeting all other requirements Today's Visits Date Type Provider Dept  03/12/23 Procedure visit Delano Metz, MD [facility]  Showing today's visits and meeting all other requirements Future Appointments Date Type Provider Dept  04/04/23 Appointment Delano Metz, MD [facility]  Showing future appointments within next 90 days and meeting all other requirements  Disposition: Discharge home  Discharge (Date  Time): 03/12/2023; 1110 hrs.   Primary Care Physician: [facility] Location: [facility] Note by: Oswaldo Done, MD (TTS technology used. I apologize for any typographical errors that were not detected and corrected.) Date: 03/12/2023; Time: 11:46 AM  Disclaimer:  Medicine is not an Visual merchandiser. The only guarantee in medicine is that nothing is guaranteed. It is important to note that the decision to proceed with this intervention was based on the information collected from the patient. The Data and conclusions were drawn from the patient's questionnaire, the interview, and the physical examination. Because the information was provided in large part by the patient, it cannot be guaranteed that it has not been purposely or unconsciously manipulated. Every effort has been made to obtain as much relevant data as possible for this evaluation. It is important to note that the conclusions that lead to this procedure are derived in large part from the available data. Always take into account that the treatment will also be dependent on availability of resources and existing treatment guidelines, considered by other Pain Management Practitioners as being common knowledge and practice, at the time of the intervention. For Medico-Legal purposes, it is also important to point out that variation in procedural techniques and pharmacological choices are the acceptable norm. The indications, contraindications, technique, and results of the above procedure should only be interpreted and judged by a Board-Certified Interventional Pain Specialist with extensive familiarity and expertise in the same exact procedure and technique.

## 2023-03-12 ENCOUNTER — Ambulatory Visit
Admission: RE | Admit: 2023-03-12 | Discharge: 2023-03-12 | Disposition: A | Discharge status: Home / Self Care | Payer: Medicare Other | Source: Ambulatory Visit | Attending: Pain Medicine | Admitting: Pain Medicine

## 2023-03-12 ENCOUNTER — Ambulatory Visit: Payer: Medicare Other | Attending: Pain Medicine | Admitting: Pain Medicine

## 2023-03-12 ENCOUNTER — Encounter: Payer: Self-pay | Admitting: Pain Medicine

## 2023-03-12 VITALS — BP 121/96 | HR 96 | Temp 97.4°F | Resp 16 | Ht 65.0 in | Wt 282.0 lb

## 2023-03-12 DIAGNOSIS — G8929 Other chronic pain: Secondary | ICD-10-CM | POA: Diagnosis not present

## 2023-03-12 DIAGNOSIS — M961 Postlaminectomy syndrome, not elsewhere classified: Secondary | ICD-10-CM | POA: Insufficient documentation

## 2023-03-12 DIAGNOSIS — M533 Sacrococcygeal disorders, not elsewhere classified: Secondary | ICD-10-CM | POA: Insufficient documentation

## 2023-03-12 DIAGNOSIS — M545 Low back pain, unspecified: Secondary | ICD-10-CM | POA: Insufficient documentation

## 2023-03-12 DIAGNOSIS — M5137 Other intervertebral disc degeneration, lumbosacral region: Secondary | ICD-10-CM | POA: Insufficient documentation

## 2023-03-12 DIAGNOSIS — M461 Sacroiliitis, not elsewhere classified: Secondary | ICD-10-CM | POA: Insufficient documentation

## 2023-03-12 DIAGNOSIS — Z5189 Encounter for other specified aftercare: Secondary | ICD-10-CM | POA: Diagnosis not present

## 2023-03-12 MED ORDER — PENTAFLUOROPROP-TETRAFLUOROETH EX AERO
INHALATION_SPRAY | Freq: Once | CUTANEOUS | Status: AC
  Administered 2023-03-12: 30 via TOPICAL
  Filled 2023-03-12: qty 116

## 2023-03-12 MED ORDER — LACTATED RINGERS IV SOLN: Freq: Once | INTRAVENOUS | Status: AC

## 2023-03-12 MED ORDER — ROPIVACAINE HCL 2 MG/ML IJ SOLN
INTRAMUSCULAR | Status: AC
  Filled 2023-03-12: qty 20

## 2023-03-12 MED ORDER — MIDAZOLAM HCL 5 MG/5ML IJ SOLN
0.5000 mg | Freq: Once | INTRAMUSCULAR | Status: AC
  Administered 2023-03-12: 1 mg via INTRAVENOUS

## 2023-03-12 MED ORDER — ROPIVACAINE HCL 2 MG/ML IJ SOLN
9.0000 mL | Freq: Once | INTRAMUSCULAR | Status: AC
  Administered 2023-03-12: 9 mL via INTRA_ARTICULAR

## 2023-03-12 MED ORDER — LIDOCAINE HCL 2 % IJ SOLN
20.0000 mL | Freq: Once | INTRAMUSCULAR | Status: AC
  Administered 2023-03-12: 400 mg

## 2023-03-12 MED ORDER — FENTANYL CITRATE (PF) 100 MCG/2ML IJ SOLN
25.0000 ug | INTRAMUSCULAR | Status: DC | PRN
  Administered 2023-03-12: 50 ug via INTRAVENOUS

## 2023-03-12 MED ORDER — LIDOCAINE HCL 2 % IJ SOLN
INTRAMUSCULAR | Status: AC
  Filled 2023-03-12: qty 20

## 2023-03-12 MED ORDER — FENTANYL CITRATE (PF) 100 MCG/2ML IJ SOLN
INTRAMUSCULAR | Status: AC
  Filled 2023-03-12: qty 2

## 2023-03-12 MED ORDER — METHYLPREDNISOLONE ACETATE 80 MG/ML IJ SUSP
80.0000 mg | Freq: Once | INTRAMUSCULAR | Status: AC
  Administered 2023-03-12: 80 mg via INTRA_ARTICULAR

## 2023-03-12 MED ORDER — MIDAZOLAM HCL 5 MG/5ML IJ SOLN
INTRAMUSCULAR | Status: AC
  Filled 2023-03-12: qty 5

## 2023-03-12 MED ORDER — METHYLPREDNISOLONE ACETATE 80 MG/ML IJ SUSP
INTRAMUSCULAR | Status: AC
  Filled 2023-03-12: qty 1

## 2023-03-12 NOTE — Patient Instructions (Signed)

## 2023-03-13 ENCOUNTER — Telehealth: Payer: Self-pay

## 2023-03-13 NOTE — Telephone Encounter (Signed)
Post procedure follow up.  Patient states she is doing good.  

## 2023-03-19 DIAGNOSIS — R002 Palpitations: Secondary | ICD-10-CM | POA: Diagnosis not present

## 2023-04-04 ENCOUNTER — Encounter: Payer: Self-pay | Admitting: Pain Medicine

## 2023-04-04 ENCOUNTER — Ambulatory Visit: Payer: Medicare Other | Attending: Pain Medicine | Admitting: Pain Medicine

## 2023-04-04 VITALS — BP 140/87 | HR 107 | Temp 97.4°F | Resp 16 | Ht 65.0 in | Wt 282.0 lb

## 2023-04-04 DIAGNOSIS — R937 Abnormal findings on diagnostic imaging of other parts of musculoskeletal system: Secondary | ICD-10-CM | POA: Diagnosis not present

## 2023-04-04 DIAGNOSIS — M461 Sacroiliitis, not elsewhere classified: Secondary | ICD-10-CM | POA: Diagnosis not present

## 2023-04-04 DIAGNOSIS — M47894 Other spondylosis, thoracic region: Secondary | ICD-10-CM | POA: Diagnosis not present

## 2023-04-04 DIAGNOSIS — M549 Dorsalgia, unspecified: Secondary | ICD-10-CM | POA: Diagnosis not present

## 2023-04-04 DIAGNOSIS — Z09 Encounter for follow-up examination after completed treatment for conditions other than malignant neoplasm: Secondary | ICD-10-CM | POA: Diagnosis not present

## 2023-04-04 DIAGNOSIS — M25552 Pain in left hip: Secondary | ICD-10-CM | POA: Diagnosis not present

## 2023-04-04 DIAGNOSIS — M546 Pain in thoracic spine: Secondary | ICD-10-CM | POA: Diagnosis not present

## 2023-04-04 DIAGNOSIS — G8929 Other chronic pain: Secondary | ICD-10-CM | POA: Insufficient documentation

## 2023-04-04 DIAGNOSIS — M545 Low back pain, unspecified: Secondary | ICD-10-CM

## 2023-04-04 DIAGNOSIS — M47814 Spondylosis without myelopathy or radiculopathy, thoracic region: Secondary | ICD-10-CM | POA: Diagnosis not present

## 2023-04-04 DIAGNOSIS — M160 Bilateral primary osteoarthritis of hip: Secondary | ICD-10-CM

## 2023-04-04 DIAGNOSIS — M533 Sacrococcygeal disorders, not elsewhere classified: Secondary | ICD-10-CM | POA: Diagnosis not present

## 2023-04-04 NOTE — Progress Notes (Signed)
Safety precautions to be maintained throughout the outpatient stay will include: orient to surroundings, keep bed in low position, maintain call bell within reach at all times, provide assistance with transfer out of bed and ambulation.  

## 2023-04-04 NOTE — Progress Notes (Signed)
PROVIDER NOTE: Information contained herein reflects review and annotations entered in association with encounter. Interpretation of such information and data should be left to medically-trained personnel. Information provided to patient can be located elsewhere in the medical record under "Patient Instructions". Document created using STT-dictation technology, any transcriptional errors that may result from process are unintentional.    Patient: Amy [name]  Service Category: E/M  Provider: Oswaldo Done, MD  DOB: 14-Dec-1945  DOS: 04/04/2023  Referring Provider: [facility]  MRN: [id number]  Specialty: Interventional Pain Management  PCP: [facility]  Type: Established Patient  Setting: Ambulatory outpatient    Location: Office  Delivery: Face-to-face     HPI  Amy [name], a 77 y.o. year old female, is here today because of her Chronic bilateral low back pain without sciatica [M54.50, G89.29]. 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); Chronic neck pain (Right); Lumbar spondylosis; Lumbar facet syndrome; Lumbar facet hypertrophy; Epidural fibrosis; Epidural lipomatosis; Neurogenic pain; Occipital headaches; Failed back surgical syndrome (6); History of lumbar fusion; H/O nonunion of  fracture; Trochanteric bursitis; Lumbar post-laminectomy syndrome; Sacroiliac joint disease (Bilateral); Secondary malignant neoplasm of vertebral column (HCC); Thoracic radiculopathy open (T5/6) (Bilateral); Arthropathy; Displacement of lumbar intervertebral disc without myelopathy; DDD (degenerative disc disease), lumbosacral; Chronic thoracic back pain (2ry area of Pain) (Bilateral) (R>L); Thoracic foraminal stenosis (Bilateral); Abnormal CT of thoracic spine (06/25/2022); Abnormal MRI, thoracic spine (10/24/2021); Abnormal CT scan, lumbar spine (09/01/2021); Abnormal MRI, lumbar spine (04/13/2021); Chronic sacroiliitis (HCC) (Bilateral); Fusion of spine, thoracolumbar region (T10-L3); Chronic sacroiliac joint pain (Bilateral); Chronic upper back pain; Thoracic spine pain; Thoracic facet joint pain; Thoracic facet syndrome; Spondylosis without myelopathy or radiculopathy, thoracic region; and Chronic hip pain (Left) on their pertinent problem list. Pain Assessment: Severity of Chronic pain is reported as a 5 /10. Location: Back Upper, Lower, Left, Right/into left leg to just above the knee on the outer aspect.  into the hip on the right. Onset: More than a month ago. Quality: Discomfort, Constant, Radiating. Timing: Constant. Modifying factor(s): procedure helped for a short period.  tylenol, icy hot, linament, oils. Vitals:  height is 5\' 5"  (1.651 m) and weight is 282 lb (127.9 kg). Her temporal temperature is 97.4 F (36.3 C) (abnormal). Her blood pressure is 140/87 (abnormal) and her pulse is 107 (abnormal). Her respiration is 16 and oxygen saturation is 99%.  BMI: Estimated body mass index is 46.93 kg/m as calculated from the following:   Height as of this encounter: 5\' 5"  (1.651 m).   Weight as of this encounter: 282 lb (127.9 kg). Last encounter: 02/25/2023. Last procedure: 03/12/2023.  Reason for encounter: post-procedure evaluation and assessment. The patient indicates having attained 100% relief  of the pain for the duration of the local anesthetic followed by a decrease to an 80% improvement that lasted for approximately 2 days.  On 03/14/2023 she had pain in the left upper thigh as well as the  upper back.  She describes that on 03/19/2023 the pain then returned to the area that we had done the injections.  She states currently having an ongoing 40% improvement of the pain in the area of the sacroiliac joints.  Further evaluation today reveals the left upper thigh pain to be secondary to her hip joint osteoarthritis. Body mass index is 46.93 kg/m.  The pain that she describes in the upper back appears to be coming from the facet joints and pedicle screws and thoracolumbar fusion hardware extending from T10 down to L3.  In addition, x-rays of the thoracic spine done on 02/12/2023 revealed thoracic facet spurring at the T8-9, T9-10, which was previously seen on her thoracic CT done on 06/22/2022.  At this time since the patient indicates the pain in the thoracic region to be worse than that of the hips, we will begin by bringing her back for a diagnostic T9-T12 thoracic facet block under fluoroscopic guidance and IV sedation.  I plan to conduct fluoroscopic mapping at the time of the procedure to determine the levels most likely to be affected.  Post-procedure evaluation   Procedure: Sacroiliac Joint Steroid Injection #1    Laterality: Bilateral     Level: PSIS (Posterior Superior Iliac Spine)  Imaging: Fluoroscopic guidance Anesthesia: Local anesthesia (1-2% Lidocaine) Anxiolysis: IV Versed         Sedation: Moderate Sedation                       DOS: 03/12/2023  Performed by: Oswaldo Done, MD  Purpose: Diagnostic/Therapeutic Indications: Sacroiliac joint pain in the lower back and hip area severe enough to impact quality of life or function. Rationale (medical necessity): procedure needed and proper for the diagnosis and/or treatment of Amy [name]'s medical symptoms and  needs. 1. Chronic low back pain (1ry area of Pain) (Bilateral) (R>L) w/o sciatica   2. Chronic sacroiliitis (HCC) (Bilateral)   3. Chronic sacroiliac joint pain (Bilateral)   4. Sacroiliac joint disease (Bilateral)   5. DDD (degenerative disc disease), lumbosacral   6. Failed back surgical syndrome (6)    NAS-11 Pain score:   Pre-procedure: 7 /10   Post-procedure: 0-No pain/10      Effectiveness:  Initial hour after procedure: 100 %. Subsequent 4-6 hours post-procedure: 100 %. Analgesia past initial 6 hours: 80 % (on 03/14/23 had pain in upper thigh on the left as well as upper back.  then pain completely returned on 03/19/23). Ongoing improvement:  Analgesic: The patient indicates having attained 100% relief of the pain for the duration of the local anesthetic followed by a decrease to an 80% improvement that lasted for approximately 2 days.  On 03/14/2023 she had pain in the left upper thigh as well as the upper back.  She describes that on 03/19/2023 the pain then returned to the area that we had done the injections.  She states currently having an ongoing 40% improvement of the pain in the area of the sacroiliac joints. Function: Somewhat improved ROM: Minimal improvement  Pharmacotherapy Assessment  Analgesic: None MME/day: 0 mg/day   Monitoring: [place] PMP: PDMP reviewed during this encounter.       Pharmacotherapy: No side-effects or adverse reactions reported. Compliance: No problems identified. Effectiveness: Clinically acceptable.  Vernie Ammons, RN  04/04/2023  2:16 PM  Sign when Signing Visit Safety precautions to be maintained throughout the outpatient stay will include: orient to surroundings, keep bed in low position, maintain call bell within  reach at all times, provide assistance with transfer out of bed and ambulation.     No results found for: "CBDTHCR" No results found for: "D8THCCBX" No results found for: "D9THCCBX"  UDS:  Summary  Date Value Ref Range  Status  01/28/2023 Note  Final    Comment:    ==================================================================== Compliance Drug Analysis, Ur ==================================================================== Test                             Result       Flag       Units  Drug Present and Declared for Prescription Verification   Gabapentin                     PRESENT      EXPECTED   Paroxetine                     PRESENT      EXPECTED   Acetaminophen                  PRESENT      EXPECTED   Clonidine                      PRESENT      EXPECTED   Metoprolol                     PRESENT      EXPECTED  Drug Present not Declared for Prescription Verification   Ibuprofen                      PRESENT      UNEXPECTED  Drug Absent but Declared for Prescription Verification   Tramadol                       Not Detected UNEXPECTED ng/mg creat   Methocarbamol                  Not Detected UNEXPECTED   Diphenhydramine                Not Detected UNEXPECTED ==================================================================== Test                      Result    Flag   Units      Ref Range   Creatinine              61               mg/dL      >=40 ==================================================================== Declared Medications:  The flagging and interpretation on this report are based on the  following declared medications.  Unexpected results may arise from  inaccuracies in the declared medications.   **Note: The testing scope of this panel includes these medications:   Clonidine (Catapres)  Diphenhydramine (Benadryl)  Gabapentin (Neurontin)  Methocarbamol (Robaxin)  Metoprolol (Lopressor)  Paroxetine (Paxil)  Tramadol (Ultram)   **Note: The testing scope of this panel does not include small to  moderate amounts of these reported medications:   Acetaminophen (Tylenol)   **Note: The testing scope of this panel does not include the  following reported medications:    Atorvastatin (Lipitor)  Estradiol (Estrace)  Fluticasone (Flonase)  Furosemide (Lasix)  Hydralazine (Apresoline)  Hydrochlorothiazide (Hydrodiuril)  Levothyroxine (Synthroid)  Menthol  Nifedipine (Procardia)  Pantoprazole (Protonix)  Potassium (Klor-Con)  Vitamin E ==================================================================== For clinical consultation, please call [phone]. ====================================================================       ROS  Constitutional: Denies any fever or chills Gastrointestinal: No reported hemesis, hematochezia, vomiting, or acute GI distress Musculoskeletal: Denies any acute onset joint swelling, redness, loss of ROM, or weakness Neurological: No reported episodes of acute onset apraxia, aphasia, dysarthria, agnosia, amnesia, paralysis, loss of coordination, or loss of consciousness  Medication Review  B Complex Vitamins, Menthol (Topical Analgesic), NIFEdipine, PARoxetine, Potassium Chloride ER, acetaminophen, atorvastatin, cloNIDine, diphenhydrAMINE, estradiol, fluticasone, furosemide, gabapentin, hydrALAZINE, levothyroxine, losartan, methocarbamol, pantoprazole, and vitamin E  History Review  Allergy: Amy [name] is allergic to latex, crestor [rosuvastatin calcium], penicillins, tetanus toxoids, and tomato. Drug: Amy [name]  reports no history of drug use. Alcohol:  reports that she does not currently use alcohol. Tobacco:  reports that she quit smoking about 53 years ago. Her smoking use included cigarettes. She started smoking about 58 years ago. She has a 1.3 pack-year smoking history. She has never used smokeless tobacco. Social: Amy [name]  reports that she quit smoking about 53 years ago. Her smoking use included cigarettes. She started smoking about 58 years ago. She has a 1.3 pack-year smoking history. She has never used smokeless tobacco. She reports that she does not currently use alcohol. She  reports that she does not use drugs. Medical:  has a past medical history of Abdominal pain of unknown etiology (02/05/2016), Anxiety, Arthritis, Asthma, Chronic back pain, Complication of anesthesia, Constipation, Depression, Dysphonia, Esophageal dysmotility, Fibromyalgia, Fibromyalgia, GERD (gastroesophageal reflux disease), Glaucoma, Headache, High cholesterol, History of DVT (deep vein thrombosis) (1989), History of hiatal hernia, History of MRSA infection (2011), Hypertension, Hypothyroidism, Irregular heart rate, Low iron, Lumbago, Mild obstructive sleep apnea, Neuropathy, Pelvic pain in female, Peripheral vascular disease (HCC), SBO (small bowel obstruction) (HCC) (06/2016), Seasonal allergies, Short of breath on exertion, Urinary frequency, Vaginal atrophy, and Weakness. Surgical: Amy [name]  has a past surgical history that includes Abdominal hysterectomy (1978); Nose surgery (2007); Mass excision (Left, 02/20/2013); Esophagogastroduodenoscopy (N/A, 07/15/2013); abdominal adhesions removed; Colonoscopy; Bunionectomy (Left, 2008); Shoulder open rotator cuff repair (Left, 05/05/2014); Esophageal manometry (N/A, 11/22/2014); Lumbar fusion (2014); REMOVAL HARDWARE L4-L5/  BILATERAL LAMINECTOMY L2 - L5 AND FUSION (06-12-2011); Laparoscopic cholecystectomy (01-11-2006); cysto with hydrodistension (N/A, 07/12/2015); Eye surgery (Bilateral); Tonsillectomy; Hardware Removal (N/A, 01/02/2016); Spine surgery; laparoscopy (N/A, 07/06/2016); laparotomy (N/A, 07/06/2016); Back surgery; Colon surgery; Dilation and curettage of uterus; lipoma removal; Exploratory laparotomy; ingrown toenail removal; and Cataract extraction (Bilateral, 2017). Family: family history includes Aneurysm in her maternal grandmother; Cancer in her daughter, mother, and sister; Colon cancer in her father; Colon polyps in her father; Diabetes in her mother; Hypertension in her brother, brother, father, mother, and sister; Kidney disease in  her brother and mother; Liver cancer in her brother and brother; Osteoarthritis in her mother; Stroke in her sister.  Laboratory Chemistry Profile   Renal Lab Results  Component Value Date   BUN 32 (H) 02/12/2023   CREATININE 1.65 (H) 02/12/2023   BCR 15 12/05/2022   GFRAA 66 07/28/2020   GFRNONAA 32 (L) 02/12/2023    Hepatic Lab Results  Component Value Date   AST 21 09/29/2022   ALT 16 09/29/2022   ALBUMIN 3.8 09/29/2022   ALKPHOS 103 09/29/2022   LIPASE 34 03/20/2021    Electrolytes Lab Results  Component Value Date   NA 140 02/12/2023   K 4.1 02/12/2023   CL 99 02/12/2023   CALCIUM 9.2 02/12/2023   MG 2.0 09/29/2022  PHOS 3.6 07/27/2016    Bone Lab Results  Component Value Date   25OHVITD1 47 01/28/2023   25OHVITD2 <1.0 01/28/2023   25OHVITD3 47 01/28/2023    Inflammation (CRP: Acute Phase) (ESR: Chronic Phase) Lab Results  Component Value Date   CRP 1 01/28/2023   ESRSEDRATE 34 01/28/2023   LATICACIDVEN 1.09 07/21/2016         Note: Above Lab results reviewed.  Recent Imaging Review  LONG TERM MONITOR (3-14 DAYS) Patch Wear Time:  13 days and 23 hours (2024-06-04T16:51:31-0400 to  2024-06-18T16:51:27-0400)  Patient had a min HR of 73 bpm, max HR of 176 bpm, and avg HR of 98 bpm.  Predominant underlying rhythm was Sinus Rhythm. 1 run of Supraventricular  Tachycardia occurred lasting 4 beats with a max rate of 176 bpm (avg 135  bpm). Isolated SVEs were rare  (<1.0%), SVE Couplets were rare (<1.0%), and SVE Triplets were rare  (<1.0%). Isolated VEs were rare (<1.0%, 32), VE Couplets were rare (<1.0%,  2), and VE Triplets were rare (<1.0%, 1).   Conclusion 1 episode of paroxysmal SVT. No significant sustained arrhythmias noted. Overall benign cardiac monitor. Note: Reviewed        Physical Exam  General appearance: Well nourished, well developed, and well hydrated. In no apparent acute distress Mental status: Alert, oriented x 3 (person, place, &  time)       Respiratory: No evidence of acute respiratory distress Eyes: PERLA Vitals: BP (!) 140/87 (BP Location: Left Arm, Patient Position: Sitting, Cuff Size: Normal) Comment (Cuff Size): forearm  Pulse (!) 107   Temp (!) 97.4 F (36.3 C) (Temporal)   Resp 16   Ht 5\' 5"  (1.651 m)   Wt 282 lb (127.9 kg)   SpO2 99%   BMI 46.93 kg/m  BMI: Estimated body mass index is 46.93 kg/m as calculated from the following:   Height as of this encounter: 5\' 5"  (1.651 m).   Weight as of this encounter: 282 lb (127.9 kg). Ideal: Ideal body weight: 57 kg (125 lb 10.6 oz) Adjusted ideal body weight: 85.4 kg (188 lb 3.2 oz)  Assessment   Diagnosis Status  1. Chronic low back pain (1ry area of Pain) (Bilateral) (R>L) w/o sciatica   2. Chronic sacroiliitis (HCC) (Bilateral)   3. Chronic sacroiliac joint pain (Bilateral)   4. Postop check   5. Abnormal MRI, thoracic spine (10/24/2021)   6. Chronic upper back pain   7. Chronic thoracic back pain (2ry area of Pain) (Bilateral) (R>L)   8. Thoracic spine pain   9. Thoracic facet joint pain   10. Thoracic facet syndrome   11. Spondylosis without myelopathy or radiculopathy, thoracic region   12. Chronic hip pain (Left)   13. 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  Amy [name] has a current medication list which includes the following long-term medication(s): atorvastatin, clonidine, fluticasone, furosemide, hydralazine, levothyroxine, and nifedipine.  Pharmacotherapy (Medications Ordered): No orders of the defined types were placed in this encounter.  Orders:   Orders Placed This Encounter  Procedures   THORACIC FACET BLOCK    Standing Status:   Future  Standing Expiration Date:   07/05/2023    Scheduling Instructions:     Thoracic Medial Branch Block (T9-12)     Side: Bilateral     Sedation: With Sedation.     Timeframe: ASAA    Order Specific Question:   Where will this procedure be performed?    Answer:   [facility]   Nursing Instructions:    Please complete this patient's postprocedure evaluation.    Scheduling Instructions:     Please complete this patient's postprocedure evaluation.   Follow-up plan:   Return for (ECT): (B) (T9-12) T-FCT BLK.      Interventional Therapies  Risk Factors  Considerations:  Hx. suicide attempt  Lumbar Post-Laminectomy Syndrome x6            Thoracolumbar HARDWARE (NO RFA)     Planned  Pending:   Diagnostic bilateral Thoracic FCT (T9-12) MBB #1    Under consideration:   Diagnostic bilateral SI joint block #1  Diagnostic midline caudal ESI #1    Completed:   Diagnostic bilateral SI joint block x1 (03/12/2023)    Therapeutic  Palliative (PRN) options:   None established   Completed by other providers:   Therapeutic Nerve Blocks by [facility].      Recent Visits Date Type Provider Dept  03/12/23 Procedure visit Delano Metz, MD [facility]  02/25/23 Office Visit Delano Metz, MD [facility]  01/28/23 Office Visit Delano Metz, MD [facility]  Showing recent visits within past 90 days and meeting all other requirements Today's Visits Date Type Provider Dept  04/04/23 Office Visit Delano Metz, MD [facility]  Showing today's visits and meeting all other requirements Future Appointments Date Type Provider Dept  04/09/23 Appointment Delano Metz, MD [facility]  Showing future appointments within next 90 days and meeting all other requirements  I  discussed the assessment and treatment plan with the patient. The patient was provided an opportunity to ask questions and all were answered. The patient agreed with the plan and demonstrated an understanding of the instructions.  Patient advised to call back or seek an in-person evaluation if the symptoms or condition worsens.  Duration of encounter: 51 minutes.  Total time on encounter, as per AMA guidelines included both the face-to-face and non-face-to-face time personally spent by the physician and/or other qualified health care professional(s) on the day of the encounter (includes time in activities that require the physician or other qualified health care professional and does not include time in activities normally performed by clinical staff). Physician's time may include the following activities when performed: Preparing to see the patient (e.g., pre-charting review of records, searching for previously ordered imaging, lab work, and nerve conduction tests) Review of prior analgesic pharmacotherapies. Reviewing PMP Interpreting ordered tests (e.g., lab work, imaging, nerve conduction tests) Performing post-procedure evaluations, including interpretation of diagnostic procedures Obtaining and/or reviewing separately obtained history Performing a medically appropriate examination and/or evaluation Counseling and educating the patient/family/caregiver Ordering medications, tests, or procedures Referring and communicating with other health care professionals (when not separately reported) Documenting clinical information in the electronic or other health record Independently interpreting results (not separately reported) and communicating results to the patient/ family/caregiver Care coordination (not separately reported)  Note by: Oswaldo Done, MD Date: 04/04/2023; Time: 4:21 PM

## 2023-04-04 NOTE — Patient Instructions (Addendum)
Facet Joint Block The facet joints connect the bones of the spine (vertebrae). They let you bend, twist, and make other movements with your spine. They also keep you from bending too far, twisting too far, and making other extreme movements. A facet joint block is a procedure where a numbing medicine (local anesthesia) is injected into a facet joint. Many times, a medicine for inflammation (steroid) is also injected. A facet joint block may be done: To diagnose neck or back pain. If the pain gets better after a facet joint block, the pain is likely coming from the facet joint. If the pain does not get better, the pain is likely not coming from the facet joint. To treat neck or back pain caused by an inflamed facet joint. To help you with physical therapy or other rehab (rehabilitation) exercises. Tell a health care provider about: Any allergies you have. All medicines you are taking, including vitamins, herbs, eye drops, creams, and over-the-counter medicines. Any problems you or family members have had with anesthesia. Any bleeding problems you have. Any surgeries you have had. Any medical conditions you have or have had. Whether you are pregnant or may be pregnant. What are the risks? Your health care provider will talk with you about risks. These may include: Infection. Allergic reactions to medicines or dyes. Bleeding. Injury to a nerve near where the needle was put in (injection site). Pain at the injection site. Short-term weakness or numbness in areas near the nerves at the injection site. What happens before the procedure? When to stop eating and drinking Follow instructions from your health care provider about what you may eat and drink. Medicines Ask your health care provider about: Changing or stopping your regular medicines. These include any diabetes medicines or blood thinners you take. Taking medicines such as aspirin and ibuprofen. These medicines can thin your blood. Do  not take these medicines unless your health care provider tells you to. Taking over-the-counter medicines, vitamins, herbs, and supplements. General instructions If you will be going home right after the procedure, plan to have a responsible adult: Take you home from the hospital or clinic. You will not be allowed to drive. Care for you for the time you are told. Ask your health care provider: How your injection site will be marked. What steps will be taken to help prevent infection. These may include washing skin with a soap that kills germs. What happens during the procedure?  An IV will be inserted into one of your veins. You will lie on your stomach on an X-ray table. You may be asked to lie in a different position if you will be getting an injection in your neck. Your injection site will be cleaned with a soap that kills germs and then covered with a germ-free (sterile) drape. A local anesthesia will be put in at the injection site. A type of X-ray machine (fluoroscopy) or CT scan will be used to help find your facet joint. A contrast dye may also be injected into your joint to help show if the needle is at the joint. When your provider knows the needle is at your joint, they will inject anesthesia and anti-inflammatory medicine as needed. The needle will be removed. Pressure will be applied to keep your injection site from bleeding. A bandage (dressing) will be placed over each injection site. The procedure may vary among health care providers and hospitals. What happens after the procedure? Your blood pressure, heart rate, breathing rate, and blood oxygen level   will be monitored until you leave the hospital or clinic. This information is not intended to replace advice given to you by your health care provider. Make sure you discuss any questions you have with your health care provider. Document Revised: 03/23/2022 Document Reviewed: 03/23/2022 Else[employer] Patient Education  2024  [employer]  ______________________________________________________________________  Procedure instructions  Do not eat or drink fluids (other than water) for 6 hours before your procedure  No water for 2 hours before your procedure  Take your blood pressure medicine with a sip of water  Arrive 30 minutes before your appointment  Carefully read the "Preparing for your procedure" detailed instructions  If you have questions call us at [phone]  _____________________________________________________________________    ______________________________________________________________________  Preparing for your procedure  Appointments: If you think you may not be able to keep your appointment, call 24-48 hours in advance to cancel. We need time to make it available to others.  During your procedure appointment there will be: No Prescription Refills. No disability issues to discussed. No medication changes or discussions.  Instructions: Food intake: Avoid eating anything solid for at least 8 hours prior to your procedure. Clear liquid intake: You may take clear liquids such as water up to 2 hours prior to your procedure. (No carbonated drinks. No soda.) Transportation: Unless otherwise stated by your physician, bring a driver. Morning Medicines: Except for blood thinners, take all of your other morning medications with a sip of water. Make sure to take your heart and blood pressure medicines. If your blood pressure's lower number is above 100, the case will be rescheduled. Blood thinners: Make sure to stop your blood thinners as instructed.  If you take a blood thinner, but were not instructed to stop it, call our office [phone] and ask to talk to a nurse. Not stopping a blood thinner prior to certain procedures could lead to serious complications. Diabetics on insulin: Notify the staff so that you can be scheduled 1st case in the morning. If your diabetes requires high  dose insulin, take only  of your normal insulin dose the morning of the procedure and notify the staff that you have done so. Preventing infections: Shower with an antibacterial soap the morning of your procedure.  Build-up your immune system: Take 1000 mg of Vitamin C with every meal (3 times a day) the day prior to your procedure. Antibiotics: Inform the nursing staff if you are taking any antibiotics or if you have any conditions that may require antibiotics prior to procedures. (Example: recent joint implants)   Pregnancy: If you are pregnant make sure to notify the nursing staff. Not doing so may result in injury to the fetus, including death.  Sickness: If you have a cold, fever, or any active infections, call and cancel or reschedule your procedure. Receiving steroids while having an infection may result in complications. Arrival: You must be in the facility at least 30 minutes prior to your scheduled procedure. Tardiness: Your scheduled time is also the cutoff time. If you do not arrive at least 15 minutes prior to your procedure, you will be rescheduled.  Children: Do not bring any children with you. Make arrangements to keep them home. Dress appropriately: There is always a possibility that your clothing may get soiled. Avoid long dresses. Valuables: Do not bring any jewelry or valuables.  Reasons to call and reschedule or cancel your procedure: (Following these recommendations will minimize the risk of a serious complication.) Surgeries: Avoid having procedures within   2 weeks of any surgery. (Avoid for 2 weeks before or after any surgery). Flu Shots: Avoid having procedures within 2 weeks of a flu shots or . (Avoid for 2 weeks before or after immunizations). Barium: Avoid having a procedure within 7-10 days after having had a radiological study involving the use of radiological contrast. (Myelograms, Barium swallow or enema study). Heart attacks: Avoid any elective procedures or surgeries  for the initial 6 months after a "Myocardial Infarction" (Heart Attack). Blood thinners: It is imperative that you stop these medications before procedures. Let us know if you if you take any blood thinner.  Infection: Avoid procedures during or within two weeks of an infection (including chest colds or gastrointestinal problems). Symptoms associated with infections include: Localized redness, fever, chills, night sweats or profuse sweating, burning sensation when voiding, cough, congestion, stuffiness, runny nose, sore throat, diarrhea, nausea, vomiting, cold or Flu symptoms, recent or current infections. It is specially important if the infection is over the area that we intend to treat. Heart and lung problems: Symptoms that may suggest an active cardiopulmonary problem include: cough, chest pain, breathing difficulties or shortness of breath, dizziness, ankle swelling, uncontrolled high or unusually low blood pressure, and/or palpitations. If you are experiencing any of these symptoms, cancel your procedure and contact your primary care physician for an evaluation.  Remember:  Regular Business hours are:  Monday to Thursday 8:00 AM to 4:00 PM  Provider's Schedule: Francisco Naveira, MD:  Procedure days: Tuesday and Thursday 7:30 AM to 4:00 PM  Bilal Lateef, MD:  Procedure days: Monday and Wednesday 7:30 AM to 4:00 PM  ______________________________________________________________________    ____________________________________________________________________________________________  General Risks and Possible Complications  Patient Responsibilities: It is important that you read this as it is part of your informed consent. It is our duty to inform you of the risks and possible complications associated with treatments offered to you. It is your responsibility as a patient to read this and to ask questions about anything that is not clear or that you believe was not covered in this  document.  Patient's Rights: You have the right to refuse treatment. You also have the right to change your mind, even after initially having agreed to have the treatment done. However, under this last option, if you wait until the last second to change your mind, you may be charged for the materials used up to that point.  Introduction: Medicine is not an exact science. Everything in Medicine, including the lack of treatment(s), carries the potential for danger, harm, or loss (which is by definition: Risk). In Medicine, a complication is a secondary problem, condition, or disease that can aggravate an already existing one. All treatments carry the risk of possible complications. The fact that a side effects or complications occurs, does not imply that the treatment was conducted incorrectly. It must be clearly understood that these can happen even when everything is done following the highest safety standards.  No treatment: You can choose not to proceed with the proposed treatment alternative. The "PRO(s)" would include: avoiding the risk of complications associated with the therapy. The "CON(s)" would include: not getting any of the treatment benefits. These benefits fall under one of three categories: diagnostic; therapeutic; and/or palliative. Diagnostic benefits include: getting information which can ultimately lead to improvement of the disease or symptom(s). Therapeutic benefits are those associated with the successful treatment of the disease. Finally, palliative benefits are those related to the decrease of the primary symptoms, without necessarily curing   the condition (example: decreasing the pain from a flare-up of a chronic condition, such as incurable terminal cancer).  General Risks and Complications: These are associated to most interventional treatments. They can occur alone, or in combination. They fall under one of the following six (6) categories: no benefit or worsening of symptoms;  bleeding; infection; nerve damage; allergic reactions; and/or death. No benefits or worsening of symptoms: In Medicine there are no guarantees, only probabilities. No healthcare provider can ever guarantee that a medical treatment will work, they can only state the probability that it may. Furthermore, there is always the possibility that the condition may worsen, either directly, or indirectly, as a consequence of the treatment. Bleeding: This is more common if the patient is taking a blood thinner, either prescription or over the counter (example: Goody Powders, Fish oil, Aspirin, Garlic, etc.), or if suffering a condition associated with impaired coagulation (example: Hemophilia, cirrhosis of the liver, low platelet counts, etc.). However, even if you do not have one on these, it can still happen. If you have any of these conditions, or take one of these drugs, make sure to notify your treating physician. Infection: This is more common in patients with a compromised immune system, either due to disease (example: diabetes, cancer, human immunodeficiency virus [HIV], etc.), or due to medications or treatments (example: therapies used to treat cancer and rheumatological diseases). However, even if you do not have one on these, it can still happen. If you have any of these conditions, or take one of these drugs, make sure to notify your treating physician. Nerve Damage: This is more common when the treatment is an invasive one, but it can also happen with the use of medications, such as those used in the treatment of cancer. The damage can occur to small secondary nerves, or to large primary ones, such as those in the spinal cord and brain. This damage may be temporary or permanent and it may lead to impairments that can range from temporary numbness to permanent paralysis and/or brain death. Allergic Reactions: Any time a substance or material comes in contact with our body, there is the possibility of an  allergic reaction. These can range from a mild skin rash (contact dermatitis) to a severe systemic reaction (anaphylactic reaction), which can result in death. Death: In general, any medical intervention can result in death, most of the time due to an unforeseen complication. ____________________________________________________________________________________________    

## 2023-04-08 ENCOUNTER — Encounter: Payer: Self-pay | Admitting: Cardiology

## 2023-04-08 ENCOUNTER — Ambulatory Visit: Payer: Medicare Other | Attending: Cardiology | Admitting: Cardiology

## 2023-04-08 ENCOUNTER — Other Ambulatory Visit
Admission: RE | Admit: 2023-04-08 | Discharge: 2023-04-08 | Disposition: A | Discharge status: Home / Self Care | Payer: Medicare Other | Source: Ambulatory Visit | Attending: Cardiology | Admitting: Cardiology

## 2023-04-08 VITALS — BP 142/76 | HR 96 | Ht 65.0 in | Wt 283.4 lb

## 2023-04-08 DIAGNOSIS — Z79899 Other long term (current) drug therapy: Secondary | ICD-10-CM | POA: Insufficient documentation

## 2023-04-08 DIAGNOSIS — I10 Essential (primary) hypertension: Secondary | ICD-10-CM | POA: Diagnosis not present

## 2023-04-08 DIAGNOSIS — I5189 Other ill-defined heart diseases: Secondary | ICD-10-CM | POA: Diagnosis not present

## 2023-04-08 DIAGNOSIS — I2510 Atherosclerotic heart disease of native coronary artery without angina pectoris: Secondary | ICD-10-CM

## 2023-04-08 DIAGNOSIS — R002 Palpitations: Secondary | ICD-10-CM | POA: Diagnosis not present

## 2023-04-08 LAB — BASIC METABOLIC PANEL
Anion gap: 8 (ref 5–15)
BUN: 23 mg/dL (ref 8–23)
CO2: 28 mmol/L (ref 22–32)
Calcium: 9.5 mg/dL (ref 8.9–10.3)
Chloride: 103 mmol/L (ref 98–111)
Creatinine, Ser: 1.17 mg/dL — ABNORMAL HIGH (ref 0.44–1.00)
GFR, Estimated: 48 mL/min — ABNORMAL LOW (ref >60)
Glucose, Bld: 118 mg/dL — ABNORMAL HIGH (ref 70–99)
Potassium: 4.6 mmol/L (ref 3.5–5.1)
Sodium: 139 mmol/L (ref 135–145)

## 2023-04-08 MED ORDER — NIFEDIPINE ER OSMOTIC RELEASE 90 MG PO TB24: 90.0000 mg | ORAL_TABLET | Freq: Every day | ORAL | 3 refills | Status: DC

## 2023-04-08 MED ORDER — CARVEDILOL 3.125 MG PO TABS: 3.1250 mg | ORAL_TABLET | Freq: Two times a day (BID) | ORAL | 3 refills | Status: DC

## 2023-04-08 NOTE — Progress Notes (Signed)
Cardiology Office Note:    Date:  04/08/2023   ID:  Amy [name], DOB 01-24-1946, MRN [id number]  PCP:  [facility]   [facility]  Cardiologist:  Debbe Odea, MD  Advanced Practice Provider:  No care team member to display Electrophysiologist:  None       Referring MD: [facility]   Chief Complaint  Patient presents with   Follow-up    Follow up after cardiac testing.  Patient tolerating medication changes made at last visit but has not improved tachycardia episodes.    History of Present Illness:    Amy [name] is a 77 y.o. 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  Last seen with symptoms of palpitations, cardiac monitor was placed showing occasional paroxysmal SVT.  She was previously on metoprolol, this was stopped, patient not sure why.  Creatinine was more elevated on last check HCTZ was stopped.  No new concerns at this time, still has occasional skipped heartbeats.  Prior notes Coronary CTA 10/2022, mild LAD stenosis 25% Lexiscan Myoview 01/2021 no evidence for ischemia, low risk study. Echo 02/07/2021 normal systolic function, EF 60 to 65%, Echo 06/2016 showed normal systolic function, EF 60 to 65%.  Impaired relaxation.  Past Medical History:  Diagnosis Date   Abdominal pain of unknown etiology 02/05/2016   Anxiety    Arthritis    ddd- RA   Asthma    "sleeping asthma"   Chronic back pain    lumbar steroid injection's   Complication of anesthesia    Hard to wake up. Pt sts is took 3 days.   Constipation    Depression    Dysphonia    intermittent "voice changes"   Esophageal dysmotility    Fibromyalgia    Fibromyalgia    GERD (gastroesophageal reflux disease)    Glaucoma    Headache    High cholesterol    ? pt states her doctor told her to  continue eating vegetables & fruit   History of DVT (deep vein thrombosis) 1989   LEFT UPPER ARM   History of hiatal hernia    History of MRSA infection 2011   Hypertension    Hypothyroidism    Irregular heart rate    "years ago"   Low iron    Lumbago    Mild obstructive sleep apnea    per study 02-07-2006 - no cpap   Neuropathy    feet   Pelvic pain in female    Peripheral vascular disease (HCC)    "poor circulation"   SBO (small bowel obstruction) (HCC) 06/2016   Seasonal allergies    Short of breath on exertion    Urinary frequency    Vaginal atrophy    Weakness    both hands and feet    Past Surgical History:  Procedure Laterality Date   abdominal adhesions removed     ABDOMINAL HYSTERECTOMY  1978   BACK SURGERY     BUNIONECTOMY Left 2008   CATARACT EXTRACTION Bilateral 2017   COLON SURGERY     COLONOSCOPY     CYSTO WITH HYDRODISTENSION N/A 07/12/2015   Procedure: CYSTOSCOPY/HYDRODISTENSION;  Surgeon: Alfredo Martinez, MD;  Location: [facility];  Service: Urology;  Laterality: N/A;   DILATION AND CURETTAGE OF UTERUS     ESOPHAGEAL MANOMETRY N/A 11/22/2014   Procedure: ESOPHAGEAL MANOMETRY (  EM);  Surgeon: Vertell Novak., MD;  Location: [facility];  Service: Endoscopy;  Laterality: N/A;   ESOPHAGOGASTRODUODENOSCOPY N/A 07/15/2013   Procedure: ESOPHAGOGASTRODUODENOSCOPY (EGD);  Surgeon: Vertell Novak., MD;  Location: [facility];  Service: Endoscopy;  Laterality: N/A;  need xray   EXPLORATORY LAPAROTOMY     EYE SURGERY Bilateral    cataract surgery with lens implants   HARDWARE REMOVAL N/A 01/02/2016   Procedure: Exploration of Lumbar Fusion,Removal of hardware Lumbar One-Two ;Redo Posterior Lumbar Fusion Lumbar One-Two;  Surgeon: Donalee Citrin, MD;  Location: [facility];  Service: Neurosurgery;  Laterality: N/A;   ingrown toenail removal     LAPAROSCOPIC CHOLECYSTECTOMY  01-11-2006   LAPAROSCOPY N/A 07/06/2016   Procedure: LAPAROSCOPY  DIAGNOSTIC EMERGENT TO OPEN;  Surgeon: Gaynelle Adu, MD;  [facility];  Service: General;  Laterality: N/A;   LAPAROTOMY N/A 07/06/2016   Procedure: EXPLORATORY LAPAROTOMY LYSIS OF ADHESIONS FOR 3 HOURS;  Surgeon: Gaynelle Adu, MD;  Location: [facility] OR;  Service: General;  Laterality: N/A;   lipoma removal     LUMBAR FUSION  2014   L4 -- L5   MASS EXCISION Left 02/20/2013   Procedure: EXCISION LEFT BUTTOCK  MASS;  Surgeon: Ardeth Sportsman, MD;  Location: [facility];  Service: General;  Laterality: Left;   NOSE SURGERY  2007   REMOVAL HARDWARE L4-L5/  BILATERAL LAMINECTOMY L2 - L5 AND FUSION  06-12-2011   SHOULDER OPEN ROTATOR CUFF REPAIR Left 05/05/2014   Procedure: OPEN ACROMIONECTOMY AND OPEN REPAIR OF ROTATOR CUFF, TISSUEMEND GRAFT WITH ANCHOR ;  Surgeon: Jacki Cones, MD;  Location: [facility];  Service: Orthopedics;  Laterality: Left;   SPINE SURGERY     x6   TONSILLECTOMY      Current Medications: Current Meds  Medication Sig   acetaminophen (TYLENOL) 500 MG tablet Take 1,000 mg by mouth every 6 (six) hours as needed for moderate pain.    atorvastatin (LIPITOR) 20 MG tablet Take 1 tablet (20 mg total) by mouth daily.   B Complex Vitamins (B COMPLEX PO) Take 1 tablet by mouth daily as needed.   carvedilol (COREG) 3.125 MG tablet Take 1 tablet (3.125 mg total) by mouth 2 (two) times daily.   cloNIDine (CATAPRES) 0.1 MG tablet Take 1 tablet (0.1 mg total) by mouth 3 (three) times daily.   diphenhydrAMINE (BENADRYL) 25 MG tablet Take 25 mg by mouth 2 (two) times daily as needed for allergies.   estradiol (ESTRACE) 0.1 MG/GM vaginal cream Place 1 Applicatorful vaginally 2 (two) times a week.   fluticasone (FLONASE) 50 MCG/ACT nasal spray Place 1 spray into both nostrils daily.   furosemide (LASIX) 20 MG tablet Take 1 tablet (20 mg total) by mouth daily.   gabapentin (NEURONTIN) 600 MG tablet Take 600-1,200 mg by mouth 2 (two) times daily. Take 600mg  in the AM and 1,200mg  in the PM    hydrALAZINE (APRESOLINE) 100 MG tablet Take 1 tablet (100 mg total) by mouth 3 (three) times daily. (Patient taking differently: Take 100 mg by mouth 2 (two) times daily.)   levothyroxine (SYNTHROID) 25 MCG tablet Take 25 mcg by mouth every morning.   losartan (COZAAR) 100 MG tablet Take 100 mg by mouth daily.   Menthol, Topical Analgesic, (ICY HOT BACK EX) Apply 1 application topically 2 (two) times daily as needed (pain).   methocarbamol (ROBAXIN) 500 MG tablet Take 500 mg by mouth 4 (four) times daily as needed for muscle spasms.  OVER THE COUNTER MEDICATION SOURSOP BITTERS liquid daily   pantoprazole (PROTONIX) 40 MG tablet Take 80 mg by mouth daily.    PARoxetine (PAXIL) 30 MG tablet Take 30 mg by mouth daily.   Potassium Chloride ER 20 MEQ TBCR Take 1 tablet by mouth daily.   vitamin E 180 MG (400 UNITS) capsule Take 400 Units by mouth daily as needed.   [DISCONTINUED] NIFEdipine (PROCARDIA XL/NIFEDICAL XL) 60 MG 24 hr tablet Take 1 tablet (60 mg total) by mouth daily.     Allergies:   Latex, Crestor [rosuvastatin calcium], Penicillins, Tetanus toxoids, and Tomato   Social History   Socioeconomic History   Marital status: Married    Spouse name: Not on file   Number of children: 1   Years of education: Not on file   Highest education level: Master's degree (e.g., MA, MS, MEng, MEd, MSW, MBA)  Occupational History   Not on file  Tobacco Use   Smoking status: Former    Current packs/day: 0.00    Average packs/day: 0.3 packs/day for 5.0 years (1.3 ttl pk-yrs)    Types: Cigarettes    Start date: 07/10/1964    Quit date: 07/10/1969    Years since quitting: 53.7   Smokeless tobacco: Never  Vaping Use   Vaping status: Never Used  Substance and Sexual Activity   Alcohol use: Not Currently    Comment: rare, social   Drug use: No   Sexual activity: Not on file  Other Topics Concern   Not on file  Social History Narrative   Lives at home with husband    Right handed    Caffeine: max 2 cups coffee per day but not does not drink daily.   Social Determinants of Health   Financial Resource Strain: Not on file  Food Insecurity: Not on file  Transportation Needs: Not on file  Physical Activity: Not on file  Stress: Not on file  Social Connections: Not on file     Family History: The patient's family history includes Aneurysm in her maternal grandmother; Cancer in her daughter, mother, and sister; Colon cancer in her father; Colon polyps in her father; Diabetes in her mother; Hypertension in her brother, brother, father, mother, and sister; Kidney disease in her brother and mother; Liver cancer in her brother and brother; Osteoarthritis in her mother; Stroke in her sister. There is no history of Migraines or Headache.  ROS:   Please see the history of present illness.     All other systems reviewed and are negative.  EKGs/Labs/Other Studies Reviewed:    The following studies were reviewed today:   EKG:  EKG not ordered today.   Recent Labs: 09/29/2022: ALT 16; Hemoglobin 13.5; Magnesium 2.0; Platelets 313 02/12/2023: BUN 32; Creatinine, Ser 1.65; Potassium 4.1; Sodium 140  Recent Lipid Panel    Component Value Date/Time   CHOL 165 07/03/2016 0627   TRIG 148 07/27/2016 0415   HDL 82 07/03/2016 0627   CHOLHDL 2.0 07/03/2016 0627   VLDL 8 07/03/2016 0627   LDLCALC 75 07/03/2016 0627     Risk Assessment/Calculations:      Physical Exam:    VS:  BP (!) 142/76 (BP Location: Left Arm, Patient Position: Sitting, Cuff Size: Large)   Pulse 96   Ht 5\' 5"  (1.651 m)   Wt 283 lb 6.4 oz (128.5 kg)   SpO2 97%   BMI 47.16 kg/m     Wt Readings from Last 3 Encounters:  04/08/23 283 lb  6.4 oz (128.5 kg)  04/04/23 282 lb (127.9 kg)  03/12/23 282 lb (127.9 kg)     GEN:  Well nourished, well developed in no acute distress HEENT: Normal NECK: No JVD; No carotid bruits CARDIAC: RRR, no murmurs, rubs, gallops RESPIRATORY:  Clear to auscultation without  rales, wheezing or rhonchi  ABDOMEN: Soft, non-tender, non-distended MUSCULOSKELETAL:  trace edema; No deformity  SKIN: Warm and dry NEUROLOGIC:  Alert and oriented x 3 PSYCHIATRIC:  Normal affect   ASSESSMENT:    1. Primary hypertension   2. Palpitations   3. Coronary artery disease involving native coronary artery of native heart, unspecified whether angina present   4. Grade II diastolic dysfunction    PLAN:    In order of problems listed above:  Hypertension, BP slightly elevated.  Increase nifedipine to 90 mg daily.  Start Coreg 12.5 mg twice daily.  Continue clonidine, hydralazine.  Advised to check BP at home daily.  Hopefully we can titrate of hydralazine and clonidine.   Palpitations, occasional paroxysmal SVT.  Start Coreg 12.5 mg twice daily as above. Nonobstructive CAD,  mild proximal LAD stenosis 25%.  Denies chest pain.  LDL at goal, continue Lipitor. HFpEF, morbid obesity.  Continue Lasix 20 mg daily.  Check BMP today.  Follow-up in 6 weeks.   Medication Adjustments/Labs and Tests Ordered: Current medicines are reviewed at length with the patient today.  Concerns regarding medicines are outlined above.  No orders of the defined types were placed in this encounter.   Meds ordered this encounter  Medications   carvedilol (COREG) 3.125 MG tablet    Sig: Take 1 tablet (3.125 mg total) by mouth 2 (two) times daily.    Dispense:  180 tablet    Refill:  3   NIFEdipine (PROCARDIA XL/NIFEDICAL-XL) 90 MG 24 hr tablet    Sig: Take 1 tablet (90 mg total) by mouth daily.    Dispense:  90 tablet    Refill:  3     Patient Instructions  Medication Instructions:   START Carvedilol - Take one tablet (12.5mg ) by mouth twice a day.  INCREASE NIFEdipine - take one tablet ( 90mg ) by mouth daily.    *If you need a refill on your cardiac medications before your next appointment, please call your pharmacy*   Lab Work:  Your physician recommends you go to the medical mall  for labs - BMP  If you have labs (blood work) drawn today and your tests are completely normal, you will receive your results only by: MyChart Message (if you have MyChart) OR A paper copy in the mail If you have any lab test that is abnormal or we need to change your treatment, we will call you to review the results.   Testing/Procedures:  None Ordered   Follow-Up: At [facility], you and your health needs are our priority.  As part of our continuing mission to provide you with exceptional heart care, we have created designated Provider Care Teams.  These Care Teams include your primary Cardiologist (physician) and Advanced Practice Providers (APPs -  Physician Assistants and Nurse Practitioners) who all work together to provide you with the care you need, when you need it.  We recommend signing up for the patient portal called "MyChart".  Sign up information is provided on this After Visit Summary.  MyChart is used to connect with patients for Virtual Visits (Telemedicine).  Patients are able to view lab/test results, encounter notes, upcoming appointments, etc.  Non-urgent  messages can be sent to your provider as well.   To learn more about what you can do with MyChart, go to [email or web address].    Your next appointment:   6 week(s)  Provider:   Debbe Odea, MD ONLY   Other Instructions  ** please check your blood pressure daily.  If the top number is below 110 please stop hydralazine      Signed, Debbe Odea, MD  04/08/2023 11:32 AM    [facility]

## 2023-04-08 NOTE — Patient Instructions (Signed)
Medication Instructions:   START Carvedilol - Take one tablet (12.5mg ) by mouth twice a day.  INCREASE NIFEdipine - take one tablet ( 90mg ) by mouth daily.    *If you need a refill on your cardiac medications before your next appointment, please call your pharmacy*   Lab Work:  Your physician recommends you go to the medical mall for labs - BMP  If you have labs (blood work) drawn today and your tests are completely normal, you will receive your results only by: MyChart Message (if you have MyChart) OR A paper copy in the mail If you have any lab test that is abnormal or we need to change your treatment, we will call you to review the results.   Testing/Procedures:  None Ordered   Follow-Up: At [facility], you and your health needs are our priority.  As part of our continuing mission to provide you with exceptional heart care, we have created designated Provider Care Teams.  These Care Teams include your primary Cardiologist (physician) and Advanced Practice Providers (APPs -  Physician Assistants and Nurse Practitioners) who all work together to provide you with the care you need, when you need it.  We recommend signing up for the patient portal called "MyChart".  Sign up information is provided on this After Visit Summary.  MyChart is used to connect with patients for Virtual Visits (Telemedicine).  Patients are able to view lab/test results, encounter notes, upcoming appointments, etc.  Non-urgent messages can be sent to your provider as well.   To learn more about what you can do with MyChart, go to [email or web address].    Your next appointment:   6 week(s)  Provider:   Debbe Odea, MD ONLY   Other Instructions  ** please check your blood pressure daily.  If the top number is below 110 please stop hydralazine

## 2023-04-08 NOTE — Progress Notes (Unsigned)
PROVIDER NOTE: Interpretation of information contained herein should be left to medically-trained personnel. Specific patient instructions are provided elsewhere under "Patient Instructions" section of medical record. This document was created in part using STT-dictation technology, any transcriptional errors that may result from this process are unintentional.  Patient: Amy [name] Type: Established DOB: 12-01-1945 MRN: [id number] PCP: [facility]  Service: Procedure DOS: 04/09/2023 Setting: Ambulatory Location: Ambulatory outpatient facility Delivery: Face-to-face Provider: Oswaldo Done, MD Specialty: Interventional Pain Management Specialty designation: 09 Location: Outpatient facility Ref. Prov.: Pa, [facility]       Interventional Therapy   Procedure: Thoracic facet medial branch block #1  Laterality: Bilateral (-50)  Level: T9, T10, T11, and T12 Medial Branch Level(s)  Imaging: Fluoroscopy-guided Spinal ([id number]) Anesthesia: Local anesthesia (1-2% Lidocaine) Anxiolysis: IV Versed 2.0 mg Sedation: Moderate Sedation None required. No Fentanyl administered.         DOS: 04/09/2023  Performed by: Oswaldo Done, MD  Purpose: Diagnostic/Therapeutic Indications: Thoracic back pain severe enough to impact quality of life or function. Rationale (medical necessity): procedure needed and proper for the diagnosis and/or treatment of [name]'s medical symptoms and needs. 1. Chronic thoracic back pain (2ry area of Pain) (Bilateral) (R>L)   2. Chronic upper back pain   3. Spondylosis without myelopathy or radiculopathy, thoracic region   4. Thoracic facet joint pain   5. Thoracic facet syndrome   6. Thoracic spine pain   7. Failed back surgical syndrome (6)   8. Fusion of spine, thoracolumbar region (T10-L3)   9. Postlaminectomy syndrome, thoracic   10. History of fusion of thoracic spine    NAS-11 Pain score:    Pre-procedure: 7 /10   Post-procedure: 0-No pain/10     Target: Thoracic posterior (dorsal) primary rami nerve Location: 2.5 cm lateral to midline (spinous process), just cephalad to upper transverse process edge.  (needle tip placement) Region: Thoracic  Approach: Paravertebral  Type of procedure: Percutaneous perineural nerve block   Pertinent Anatomy: There are two potential fascial compartments in the TPVS: the anterior extrapleural paravertebral compartment and the posterior subendothoracic paravertebral compartment. The transverse process and the superior costotransverse ligament form the posterior boundary.  Position / Prep / Materials:  Position: Prone  Prep solution: DuraPrep (Iodine Povacrylex [0.7% available iodine] and Isopropyl Alcohol, 74% w/w) Prep Area: Entire upper back region Materials:  Tray: Block Needle(s):  Type: Spinal  Gauge (G): 22  Length: 3.5-in  Qty: 4   H&P (Pre-op Assessment):  Ms. Leyh is a 77 y.o. (year old), female patient, seen today for interventional treatment. She  has a past surgical history that includes Abdominal hysterectomy (1978); Nose surgery (2007); Mass excision (Left, 02/20/2013); Esophagogastroduodenoscopy (N/A, 07/15/2013); abdominal adhesions removed; Colonoscopy; Bunionectomy (Left, 2008); Shoulder open rotator cuff repair (Left, 05/05/2014); Esophageal manometry (N/A, 11/22/2014); Lumbar fusion (2014); REMOVAL HARDWARE L4-L5/  BILATERAL LAMINECTOMY L2 - L5 AND FUSION (06-12-2011); Laparoscopic cholecystectomy (01-11-2006); cysto with hydrodistension (N/A, 07/12/2015); Eye surgery (Bilateral); Tonsillectomy; Hardware Removal (N/A, 01/02/2016); Spine surgery; laparoscopy (N/A, 07/06/2016); laparotomy (N/A, 07/06/2016); Back surgery; Colon surgery; Dilation and curettage of uterus; lipoma removal; Exploratory laparotomy; ingrown toenail removal; and Cataract extraction (Bilateral, 2017). Ms. Forgy has a current medication  list which includes the following prescription(s): acetaminophen, atorvastatin, b complex vitamins, carvedilol, clonidine, diphenhydramine, estradiol, fluticasone, furosemide, gabapentin, hydralazine, levothyroxine, losartan, menthol (topical analgesic), methocarbamol, nifedipine, OVER THE COUNTER MEDICATION, pantoprazole, paroxetine, potassium chloride er, and vitamin e, and the following Facility-Administered Medications: fentanyl and lactated ringers.  Her primarily concern today is the Back Pain (mid)  Initial Vital Signs:  Pulse/HCG Rate: 96ECG Heart Rate: 91 (NSR) Temp: (!) 97.3 F (36.3 C) Resp: 16 BP: 132/77 SpO2: 98 %  BMI: Estimated body mass index is 46.93 kg/m as calculated from the following:   Height as of this encounter: 5\' 5"  (1.651 m).   Weight as of this encounter: 282 lb (127.9 kg).  Risk Assessment: Allergies: Reviewed. She is allergic to latex, crestor [rosuvastatin calcium], penicillins, tetanus toxoids, and tomato.  Allergy Precautions: None required Coagulopathies: Reviewed. None identified.  Blood-thinner therapy: None at this time Active Infection(s): Reviewed. None identified. Ms. Grissom is afebrile  Site Confirmation: Ms. Hendriks was asked to confirm the procedure and laterality before marking the site Procedure checklist: Completed Consent: Before the procedure and under the influence of no sedative(s), amnesic(s), or anxiolytics, the patient was informed of the treatment options, risks and possible complications. To fulfill our ethical and legal obligations, as recommended by the American Medical Association's Code of Ethics, I have informed the patient of my clinical impression; the nature and purpose of the treatment or procedure; the risks, benefits, and possible complications of the intervention; the alternatives, including doing nothing; the risk(s) and benefit(s) of the alternative treatment(s) or procedure(s); and the risk(s) and benefit(s)  of doing nothing. The patient was provided information about the general risks and possible complications associated with the procedure. These may include, but are not limited to: failure to achieve desired goals, infection, bleeding, organ or nerve damage, allergic reactions, paralysis, and death. In addition, the patient was informed of those risks and complications associated to Spine-related procedures, such as failure to decrease pain; infection (i.e.: Meningitis, epidural or intraspinal abscess); bleeding (i.e.: epidural hematoma, subarachnoid hemorrhage, or any other type of intraspinal or peri-dural bleeding); organ or nerve damage (i.e.: Any type of peripheral nerve, nerve root, or spinal cord injury) with subsequent damage to sensory, motor, and/or autonomic systems, resulting in permanent pain, numbness, and/or weakness of one or several areas of the body; allergic reactions; (i.e.: anaphylactic reaction); and/or death. Furthermore, the patient was informed of those risks and complications associated with the medications. These include, but are not limited to: allergic reactions (i.e.: anaphylactic or anaphylactoid reaction(s)); adrenal axis suppression; blood sugar elevation that in diabetics may result in ketoacidosis or comma; water retention that in patients with history of congestive heart failure may result in shortness of breath, pulmonary edema, and decompensation with resultant heart failure; weight gain; swelling or edema; medication-induced neural toxicity; particulate matter embolism and blood vessel occlusion with resultant organ, and/or nervous system infarction; and/or aseptic necrosis of one or more joints. Finally, the patient was informed that Medicine is not an exact science; therefore, there is also the possibility of unforeseen or unpredictable risks and/or possible complications that may result in a catastrophic outcome. The patient indicated having understood very clearly. We  have given the patient no guarantees and we have made no promises. Enough time was given to the patient to ask questions, all of which were answered to the patient's satisfaction. Ms. Trefry has indicated that she wanted to continue with the procedure. Attestation: I, the ordering provider, attest that I have discussed with the patient the benefits, risks, side-effects, alternatives, likelihood of achieving goals, and potential problems during recovery for the procedure that I have provided informed consent. Date  Time: 04/09/2023  8:02 AM   Pre-Procedure Preparation:  Monitoring: As per clinic protocol. Respiration, ETCO2, SpO2, BP, heart rate and  rhythm monitor placed and checked for adequate function Safety Precautions: Patient was assessed for positional comfort and pressure points before starting the procedure. Time-out: I initiated and conducted the "Time-out" before starting the procedure, as per protocol. The patient was asked to participate by confirming the accuracy of the "Time Out" information. Verification of the correct person, site, and procedure were performed and confirmed by me, the nursing staff, and the patient. "Time-out" conducted as per Joint Commission's Universal Protocol (UP.01.01.01). Time: 0908 Start Time: 0908 hrs.  Description/Narrative of Procedure:          Technical description of procedure:  Rationale (medical necessity): procedure needed and proper for the diagnosis and/or treatment of the patient's medical symptoms and needs. Procedural Technique Safety Precautions: Aspiration looking for blood return was conducted prior to all injections. At no point did we inject any substances, as a needle was being advanced. No attempts were made at seeking any paresthesias. Safe injection practices and needle disposal techniques used. Medications properly checked for expiration dates. SDV (single dose vial) medications used. Target Area: For the thoracic medial branch  nerve, the target is located between the top of the transverse processes at the point where the transverse process joints the vertebra and the superior-lateral aspect of the transverse process.  (More lateral towards the superior aspect of the thoracic spine.) For safety reasons and to minimize the risk of hemo- or pneumothorax, the needle tip was not advanced past the boundary of the posterior paravertebral compartment (superior costotransverse ligament). Description of the Procedure: Protocol guidelines were followed. The patient was placed in position over the fluoroscopy table. The target area was identified and the area prepped in the usual manner. Skin & deeper tissues infiltrated with local anesthetic. Appropriate amount of time allowed to pass for local anesthetics to take effect. The procedure needles were then advanced to the target area. Proper needle placement secured. Negative aspiration confirmed. Solution injected in intermittent fashion, asking for systemic symptoms every 0.5cc of injectate. The needles were then removed and the area cleansed, making sure to leave some of the prepping solution back to take advantage of its long term bactericidal properties.             Facet Joint Innervation  T1-2 C8, T1  Medial Branch  T2-3 T1, T2         "          "  T3-4 T2, T3         "          "  T4-5 T3, T4         "          "  T5-6 T4, T5         "          "  T6-7 T5, T6         "          "  T7-8 T6, T7         "          "  T8-9 T7, T8         "          "  T9-10 T8, T9         "          "  T10-11 T9, T10         "          "  T11-12  T10, T11         "          "  T12-L1 T11, T12         "          "    Vitals:   04/09/23 0908 04/09/23 0913 04/09/23 0918 04/09/23 0920  BP: (!) 147/89 138/77 130/83 (!) 118/91  Pulse:      Resp: 15 17 18 18   Temp:      SpO2: 100% 100% 99% 99%  Weight:      Height:         Start Time: 0908 hrs. End Time: 0919 hrs.  Imaging Guidance:           Type of Imaging Technique: None used Indication(s): N/A Exposure Time: No patient exposure Contrast: None used. Fluoroscopic Guidance: N/A Ultrasound Guidance: N/A Interpretation: N/A  Post-operative Assessment:  Post-procedure Vital Signs:  Pulse/HCG Rate: 9686 Temp: (!) 97.3 F (36.3 C) Resp: 18 BP: (!) 118/91 SpO2: 99 %  EBL: None  Complications: No immediate post-treatment complications observed by team, or reported by patient.  Note: The patient tolerated the entire procedure well. A repeat set of vitals were taken after the procedure and the patient was kept under observation following institutional policy, for this type of procedure. Post-procedural neurological assessment was performed, showing return to baseline, prior to discharge. The patient was provided with post-procedure discharge instructions, including a section on how to identify potential problems. Should any problems arise concerning this procedure, the patient was given instructions to immediately contact us, at any time, without hesitation. In any case, we plan to contact the patient by telephone for a follow-up status report regarding this interventional procedure.  Comments:  No additional relevant information.  Plan of Care (POC)  Orders:  Orders Placed This Encounter  Procedures   THORACIC FACET BLOCK    Scheduling Instructions:     Thoracic Medial Branch Block     Side: Bilateral     Level(s): TBD     Sedation: With Sedation.     Timeframe: Today    Order Specific Question:   Where will this procedure be performed?    Answer:   [facility]   DG [facility] C-ARM 1-60 MIN NO REPORT    Intraoperative interpretation by procedural physician at [facility].    Standing Status:   Standing    Number of Occurrences:   1    Order Specific Question:   Reason for exam:    Answer:   Assistance in needle guidance and placement for procedures requiring needle placement in or near  specific anatomical locations not easily accessible without such assistance.   Informed Consent Details: Physician/Practitioner Attestation; Transcribe to consent form and obtain patient signature    Nursing Order: Transcribe to consent form and obtain patient signature. Note: Always confirm laterality of pain with Ms. Hise, before procedure.    Order Specific Question:   Physician/Practitioner attestation of informed consent for procedure/surgical case    Answer:   I, the physician/practitioner, attest that I have discussed with the patient the benefits, risks, side effects, alternatives, likelihood of achieving goals and potential problems during recovery for the procedure that I have provided informed consent.    Order Specific Question:   Procedure    Answer:   Thoracic medial branch facet block    Order Specific Question:   Physician/Practitioner performing the procedure    Answer:   Keandra Medero A. Laban Emperor,  MD    Order Specific Question:   Indication/Reason    Answer:   Thoracic upper back pain associated with thoracic facet syndrome (Z61.096) and thoracic spondylosis without myelopathy or radiculopathy (M47.814)   Provide equipment / supplies at bedside    Procedure tray: "Block Tray" (Disposable  single use) Skin infiltration needle: Regular 1.5-in, 25-G, (x1) Block Needle type: Spinal Amount/quantity: 4 Size: Regular (3.5-inch) Gauge: 22G    Standing Status:   Standing    Number of Occurrences:   1    Order Specific Question:   Specify    Answer:   Block Tray   Chronic Opioid Analgesic:  None MME/day: 0 mg/day   Medications ordered for procedure: Meds ordered this encounter  Medications   lidocaine (XYLOCAINE) 2 % (with pres) injection 400 mg   pentafluoroprop-tetrafluoroeth (GEBAUERS) aerosol   lactated ringers infusion   midazolam (VERSED) 5 MG/5ML injection 0.5-2 mg    Make sure Flumazenil is available in the pyxis when using this medication. If oversedation  occurs, administer 0.2 mg IV over 15 sec. If after 45 sec no response, administer 0.2 mg again over 1 min; may repeat at 1 min intervals; not to exceed 4 doses (1 mg)   fentaNYL (SUBLIMAZE) injection 25-50 mcg    Make sure Narcan is available in the pyxis when using this medication. In the event of respiratory depression (RR< 8/min): Titrate NARCAN (naloxone) in increments of 0.1 to 0.2 mg IV at 2-3 minute intervals, until desired degree of reversal.   ropivacaine (PF) 2 mg/mL (0.2%) (NAROPIN) injection 18 mL   dexamethasone (DECADRON) injection 20 mg   Medications administered: We administered lidocaine, pentafluoroprop-tetrafluoroeth, lactated ringers, midazolam, ropivacaine (PF) 2 mg/mL (0.2%), and dexamethasone.  See the medical record for exact dosing, route, and time of administration.  Follow-up plan:   Return in about 2 weeks (around 04/23/2023) for Proc-day (T,Th), (Face2F), (PPE).       Interventional Therapies  Risk Factors  Considerations:  Hx. suicide attempt  Lumbar Post-Laminectomy Syndrome x6            Thoracolumbar HARDWARE (NO RFA)     Planned  Pending:   Diagnostic bilateral Thoracic FCT (T9-12) MBB #1    Under consideration:   Diagnostic bilateral SI joint block #1  Diagnostic midline caudal ESI #1    Completed:   Diagnostic bilateral SI joint block x1 (03/12/2023)    Therapeutic  Palliative (PRN) options:   None established   Completed by other providers:   Therapeutic Nerve Blocks by [facility].       Recent Visits Date Type Provider Dept  04/04/23 Office Visit Delano Metz, MD [facility]  03/12/23 Procedure visit Delano Metz, MD [facility]  02/25/23 Office Visit Delano Metz, MD [facility]  01/28/23 Office Visit Delano Metz, MD [facility]  Showing recent visits within past 90 days and meeting all other requirements Today's Visits Date  Type Provider Dept  04/09/23 Procedure visit Delano Metz, MD [facility]  Showing today's visits and meeting all other requirements Future Appointments Date Type Provider Dept  04/23/23 Appointment Delano Metz, MD [facility]  Showing future appointments within next 90 days and meeting all other requirements  Disposition: Discharge home  Discharge (Date  Time): 04/09/2023; 0940 hrs.   Primary Care Physician: [facility] Location: [facility] Note by: Oswaldo Done, MD (TTS technology used. I apologize for any typographical  errors that were not detected and corrected.) Date: 04/09/2023; Time: 9:31 AM  Disclaimer:  Medicine is not an Visual merchandiser. The only guarantee in medicine is that nothing is guaranteed. It is important to note that the decision to proceed with this intervention was based on the information collected from the patient. The Data and conclusions were drawn from the patient's questionnaire, the interview, and the physical examination. Because the information was provided in large part by the patient, it cannot be guaranteed that it has not been purposely or unconsciously manipulated. Every effort has been made to obtain as much relevant data as possible for this evaluation. It is important to note that the conclusions that lead to this procedure are derived in large part from the available data. Always take into account that the treatment will also be dependent on availability of resources and existing treatment guidelines, considered by other Pain Management Practitioners as being common knowledge and practice, at the time of the intervention. For Medico-Legal purposes, it is also important to point out that variation in procedural techniques and pharmacological choices are the acceptable norm. The indications, contraindications, technique, and results of the above procedure should only be  interpreted and judged by a Board-Certified Interventional Pain Specialist with extensive familiarity and expertise in the same exact procedure and technique.

## 2023-04-09 ENCOUNTER — Ambulatory Visit
Admission: RE | Admit: 2023-04-09 | Discharge: 2023-04-09 | Disposition: A | Discharge status: Home / Self Care | Payer: Medicare Other | Source: Ambulatory Visit | Attending: Pain Medicine | Admitting: Pain Medicine

## 2023-04-09 ENCOUNTER — Ambulatory Visit: Payer: Medicare Other | Attending: Pain Medicine | Admitting: Pain Medicine

## 2023-04-09 ENCOUNTER — Encounter: Payer: Self-pay | Admitting: Pain Medicine

## 2023-04-09 VITALS — BP 147/77 | HR 88 | Temp 97.3°F | Resp 14 | Ht 65.0 in | Wt 282.0 lb

## 2023-04-09 DIAGNOSIS — M961 Postlaminectomy syndrome, not elsewhere classified: Secondary | ICD-10-CM | POA: Insufficient documentation

## 2023-04-09 DIAGNOSIS — G8929 Other chronic pain: Secondary | ICD-10-CM | POA: Diagnosis not present

## 2023-04-09 DIAGNOSIS — M4325 Fusion of spine, thoracolumbar region: Secondary | ICD-10-CM | POA: Diagnosis not present

## 2023-04-09 DIAGNOSIS — M47814 Spondylosis without myelopathy or radiculopathy, thoracic region: Secondary | ICD-10-CM

## 2023-04-09 DIAGNOSIS — M549 Dorsalgia, unspecified: Secondary | ICD-10-CM | POA: Diagnosis not present

## 2023-04-09 DIAGNOSIS — M546 Pain in thoracic spine: Secondary | ICD-10-CM | POA: Diagnosis not present

## 2023-04-09 DIAGNOSIS — M47894 Other spondylosis, thoracic region: Secondary | ICD-10-CM | POA: Insufficient documentation

## 2023-04-09 DIAGNOSIS — Z981 Arthrodesis status: Secondary | ICD-10-CM | POA: Diagnosis not present

## 2023-04-09 MED ORDER — LIDOCAINE HCL 2 % IJ SOLN
INTRAMUSCULAR | Status: AC
  Filled 2023-04-09: qty 20

## 2023-04-09 MED ORDER — ROPIVACAINE HCL 2 MG/ML IJ SOLN
INTRAMUSCULAR | Status: AC
  Filled 2023-04-09: qty 20

## 2023-04-09 MED ORDER — FENTANYL CITRATE (PF) 100 MCG/2ML IJ SOLN: 25.0000 ug | INTRAMUSCULAR | Status: DC | PRN

## 2023-04-09 MED ORDER — PENTAFLUOROPROP-TETRAFLUOROETH EX AERO
INHALATION_SPRAY | Freq: Once | CUTANEOUS | Status: AC
  Administered 2023-04-09: 30 via TOPICAL
  Filled 2023-04-09: qty 30

## 2023-04-09 MED ORDER — LIDOCAINE HCL 2 % IJ SOLN
20.0000 mL | Freq: Once | INTRAMUSCULAR | Status: AC
  Administered 2023-04-09: 400 mg

## 2023-04-09 MED ORDER — LACTATED RINGERS IV SOLN: Freq: Once | INTRAVENOUS | Status: AC

## 2023-04-09 MED ORDER — ROPIVACAINE HCL 2 MG/ML IJ SOLN
18.0000 mL | Freq: Once | INTRAMUSCULAR | Status: AC
  Administered 2023-04-09: 18 mL via PERINEURAL

## 2023-04-09 MED ORDER — DEXAMETHASONE SODIUM PHOSPHATE 10 MG/ML IJ SOLN
20.0000 mg | Freq: Once | INTRAMUSCULAR | Status: AC
  Administered 2023-04-09: 20 mg

## 2023-04-09 MED ORDER — MIDAZOLAM HCL 5 MG/5ML IJ SOLN
0.5000 mg | Freq: Once | INTRAMUSCULAR | Status: AC
  Administered 2023-04-09: 2 mg via INTRAVENOUS

## 2023-04-09 MED ORDER — MIDAZOLAM HCL 5 MG/5ML IJ SOLN
INTRAMUSCULAR | Status: AC
  Filled 2023-04-09: qty 5

## 2023-04-09 MED ORDER — DEXAMETHASONE SODIUM PHOSPHATE 10 MG/ML IJ SOLN
INTRAMUSCULAR | Status: AC
  Filled 2023-04-09: qty 2

## 2023-04-09 MED ORDER — FENTANYL CITRATE (PF) 100 MCG/2ML IJ SOLN
INTRAMUSCULAR | Status: AC
  Filled 2023-04-09: qty 2

## 2023-04-09 NOTE — Patient Instructions (Addendum)
____________________________________________________________________________________________  Post-Procedure Discharge Instructions  Instructions: Apply ice:  Purpose: This will minimize any swelling and discomfort after procedure.  When: Day of procedure, as soon as you get home. How: Fill a plastic sandwich bag with crushed ice. Cover it with a small towel and apply to injection site. How long: (15 min on, 15 min off) Apply for 15 minutes then remove x 15 minutes.  Repeat sequence on day of procedure, until you go to bed. Apply heat:  Purpose: To treat any soreness and discomfort from the procedure. When: Starting the next day after the procedure. How: Apply heat to procedure site starting the day following the procedure. How long: May continue to repeat daily, until discomfort goes away. Food intake: Start with clear liquids (like water) and advance to regular food, as tolerated.  Physical activities: Keep activities to a minimum for the first 8 hours after the procedure. After that, then as tolerated. Driving: If you have received any sedation, be responsible and do not drive. You are not allowed to drive for 24 hours after having sedation. Blood thinner: (Applies only to those taking blood thinners) You may restart your blood thinner 6 hours after your procedure. Insulin: (Applies only to Diabetic patients taking insulin) As soon as you can eat, you may resume your normal dosing schedule. Infection prevention: Keep procedure site clean and dry. Shower daily and clean area with soap and water. Post-procedure Pain Diary: Extremely important that this be done correctly and accurately. Recorded information will be used to determine the next step in treatment. For the purpose of accuracy, follow these rules: Evaluate only the area treated. Do not report or include pain from an untreated area. For the purpose of this evaluation, ignore all other areas of pain, except for the treated  area. After your procedure, avoid taking a long nap and attempting to complete the pain diary after you wake up. Instead, set your alarm clock to go off every hour, on the hour, for the initial 8 hours after the procedure. Document the duration of the numbing medicine, and the relief you are getting from it. Do not go to sleep and attempt to complete it later. It will not be accurate. If you received sedation, it is likely that you were given a medication that may cause amnesia. Because of this, completing the diary at a later time may cause the information to be inaccurate. This information is needed to plan your care. Follow-up appointment: Keep your post-procedure follow-up evaluation appointment after the procedure (usually 2 weeks for most procedures, 6 weeks for radiofrequencies). DO NOT FORGET to bring you pain diary with you.   Expect: (What should I expect to see with my procedure?) From numbing medicine (AKA: Local Anesthetics): Numbness or decrease in pain. You may also experience some weakness, which if present, could last for the duration of the local anesthetic. Onset: Full effect within 15 minutes of injected. Duration: It will depend on the type of local anesthetic used. On the average, 1 to 8 hours.  From steroids (Applies only if steroids were used): Decrease in swelling or inflammation. Once inflammation is improved, relief of the pain will follow. Onset of benefits: Depends on the amount of swelling present. The more swelling, the longer it will take for the benefits to be seen. In some cases, up to 10 days. Duration: Steroids will stay in the system x 2 weeks. Duration of benefits will depend on multiple posibilities including persistent irritating factors. Side-effects: If present, they  may typically last 2 weeks (the duration of the steroids). Frequent: Cramps (if they occur, drink Gatorade and take over-the-counter Magnesium 450-500 mg once to twice a day); water retention with  temporary weight gain; increases in blood sugar; decreased immune system response; increased appetite. Occasional: Facial flushing (red, warm cheeks); mood swings; menstrual changes. Uncommon: Long-term decrease or suppression of natural hormones; bone thinning. (These are more common with higher doses or more frequent use. This is why we prefer that our patients avoid having any injection therapies in other practices.)  Very Rare: Severe mood changes; psychosis; aseptic necrosis. From procedure: Some discomfort is to be expected once the numbing medicine wears off. This should be minimal if ice and heat are applied as instructed.  Call if: (When should I call?) You experience numbness and weakness that gets worse with time, as opposed to wearing off. New onset bowel or bladder incontinence. (Applies only to procedures done in the spine)  Emergency Numbers: Durning business hours (Monday - Thursday, 8:00 AM - 4:00 PM) (Friday, 9:00 AM - 12:00 Noon): [phone] After hours: [phone] NOTE: If you are having a problem and are unable connect with, or to talk to a provider, then go to your nearest urgent care or emergency department. If the problem is serious and urgent, please call 911. ____________________________________________________________________________________________   _________________________________________________________________________________________  Body mass index (BMI) Weight Management Required  URGENT: Dear Amy [name] your weight has been found to be adversely affecting your health. Aggressive action is immediately required. Talk to your primary care physician.  Body mass index (BMI) is a common tool for deciding whether a person has an appropriate body weight.  It measures a persons weight in relation to their height.   According to the [facility] of health (NIH): A BMI of less than 18.5 means that a person is underweight. A BMI of between  18.5 and 24.9 is ideal. A BMI of between 25 and 29.9 is overweight. A BMI over 30 indicates obesity.  Body Mass Index (BMI) Classification BMI level (kg/m2) Category Associated incidence of chronic pain  <18  Underweight   18.5-24.9 Ideal body weight   25-29.9 Overweight  20%  30-34.9 Obese (Class I)  68%  35-39.9 Severe obesity (Class II)  136%  >40 Extreme obesity (Class III)  254%   Your current Estimated body mass index is 46.93 kg/m as calculated from the following:   Height as of this encounter: 5\' 5"  (1.651 m).   Weight as of this encounter: 282 lb (127.9 kg).  Morbidly Obese Classification: You will be considered to be "Morbidly Obese" if your BMI is above 30 and you have one or more of the following conditions caused or associated to obesity: 1.    Type 2 Diabetes (Leading to cardiovascular diseases (CVD), stroke, peripheral vascular diseases (PVD), retinopathy, nephropathy, and neuropathy) 2.    Cardiovascular Disease (High Blood Pressure; Congestive Heart Failure; High Cholesterol; Coronary Artery Disease; Angina; Arrhythmias, Dysrhythmias, or Heart Attacks) 3.    Breathing problems (Asthma; obesity-hypoventilation syndrome; obstructive sleep apnea; chronic inflammatory airway disease; reactive airway disease; or shortness of breath) 4.    Chronic kidney disease 5.    Liver disease (nonalcoholic fatty liver disease) 6.    High blood pressure 7.    Acid reflux (gastroesophageal reflux disease; heartburn) 8.    Osteoarthritis (OA) (affecting the hip(s), the knee(s) and/or the lower back) 9.    Low back pain (Lumbar Facet Syndrome; and/or Degenerative Disc Disease) 10.  Hip pain (Osteoarthritis of hip) (For every 1 lbs of added body weight, there is a 2 lbs increase in pressure inside of each hip articulation. 1:2 mechanical relationship) 11.  Knee pain (Osteoarthritis of knee) (For every 1 lbs of added body weight, there is a 4 lbs increase in pressure inside of each knee  articulation. 1:4 mechanical relationship) (patients with a BMI>30 kg/m2 were 6.8 times more likely to develop knee OA than normal-weight individuals) 12.  Cancer: Epidemiological studies have shown that obesity is a risk factor for: post-menopausal breast cancer; cancers of the endometrium, colon and kidney cancer; malignant adenomas of the oesophagus. Obese subjects have an approximately 1.5-3.5-fold increased risk of developing these cancers compared with normal-weight subjects, and it has been estimated that between 15 and 45% of these cancers can be attributed to overweight. More recent studies suggest that obesity may also increase the risk of other types of cancer, including pancreatic, hepatic and gallbladder cancer. (Ref: Obesity and cancer. Pischon T, Nthlings U, [employer] H. Proc Nutr Soc. 2008 May;67(2):128-45. doi: 10.1017/S0029665108006976.) The International Agency for Research on Cancer (IARC) has identified 13 cancers associated with overweight and obesity: meningioma, multiple myeloma, adenocarcinoma of the esophagus, and cancers of the thyroid, postmenopausal breast cancer, gallbladder, stomach, liver, pancreas, kidney, ovaries, uterus, colon and rectal (colorectal) cancers. 55 percent of all cancers diagnosed in women and 24 percent of those diagnosed in men are associated with overweight and obesity.  Recommendation: At this point it is urgent that you take a step back and concentrate in loosing weight. Dedicate 100% of your efforts on this task. Nothing else will improve your health more than bringing your weight down and your BMI to less than 30. If you are here, you probably have chronic pain. We know that most chronic pain patients have difficulty exercising secondary to their pain. For this reason, you must rely on proper nutrition and diet in order to lose the weight. If your BMI is above 40, you should seriously consider bariatric surgery. A realistic goal is to lose 10% of your body  weight over a period of 12 months.  Be honest to yourself, if over time you have unsuccessfully tried to lose weight, then it is time for you to seek professional help and to enter a medically supervised weight management program, and/or undergo bariatric surgery. Stop procrastinating.   Pain management considerations:  1.    Pharmacological Problems: Be advised that the use of opioid analgesics (oxycodone; hydrocodone; morphine; methadone; codeine; and all of their derivatives) have been associated with decreased metabolism and weight gain.  For this reason, should we see that you are unable to lose weight while taking these medications, it may become necessary for [place] to taper down and indefinitely discontinue them.  2.    Technical Problems: The incidence of successful interventional therapies decreases as the patient's BMI increases. It is much more difficult to accomplish a safe and effective interventional therapy on a patient with a BMI above 35. 3.    Radiation Exposure Problems: The x-rays machine, used to accomplish injection therapies, will automatically increase their x-ray output in order to capture an appropriate bone image. This means that radiation exposure increases exponentially with the patient's BMI. (The higher the BMI, the higher the radiation exposure.) Although the level of radiation used at a given time is still safe to the patient, it is not for the physician and/or assisting staff. Unfortunately, radiation exposure is accumulative. Because physicians and the staff have to do  procedures and be exposed on a daily basis, this can result in health problems such as cancer and radiation burns. Radiation exposure to the staff is monitored by the radiation batches that they wear. The exposure levels are reported back to the staff on a quarterly basis. Depending on levels of exposure, physicians and staff may be obligated by law to decrease this exposure. This means that they have the right and  obligation to refuse providing therapies where they may be overexposed to radiation. For this reason, physicians may decline to offer therapies such as radiofrequency ablation or implants to patients with a BMI above 40. 4.    Current Trends: Be advised that the current trend is to no longer offer certain therapies to patients with a BMI equal to, or above 35, due to increase perioperative risks, increased technical procedural difficulties, and excessive radiation exposure to healthcare personnel.  _________________________________________________________________________________________

## 2023-04-09 NOTE — Progress Notes (Signed)
Safety precautions to be maintained throughout the outpatient stay will include: orient to surroundings, keep bed in low position, maintain call bell within reach at all times, provide assistance with transfer out of bed and ambulation.  

## 2023-04-10 ENCOUNTER — Telehealth: Payer: Self-pay

## 2023-04-10 NOTE — Telephone Encounter (Signed)
Post procedure follow up.  Patient states she is doing good.  

## 2023-04-17 DIAGNOSIS — W19XXXA Unspecified fall, initial encounter: Secondary | ICD-10-CM | POA: Insufficient documentation

## 2023-04-22 ENCOUNTER — Other Ambulatory Visit: Payer: Self-pay

## 2023-04-22 MED ORDER — FUROSEMIDE 20 MG PO TABS: 20.0000 mg | ORAL_TABLET | Freq: Every day | ORAL | 0 refills | Status: DC

## 2023-04-22 MED ORDER — CARVEDILOL 3.125 MG PO TABS: 3.1250 mg | ORAL_TABLET | Freq: Two times a day (BID) | ORAL | 3 refills | Status: DC

## 2023-04-22 MED ORDER — NIFEDIPINE ER OSMOTIC RELEASE 90 MG PO TB24: 90.0000 mg | ORAL_TABLET | Freq: Every day | ORAL | 3 refills | Status: DC

## 2023-04-22 MED ORDER — HYDRALAZINE HCL 100 MG PO TABS: 100.0000 mg | ORAL_TABLET | Freq: Two times a day (BID) | ORAL | 1 refills | Status: DC

## 2023-04-22 NOTE — Progress Notes (Signed)
PROVIDER NOTE: Information contained herein reflects review and annotations entered in association with encounter. Interpretation of such information and data should be left to medically-trained personnel. Information provided to patient can be located elsewhere in the medical record under "Patient Instructions". Document created using STT-dictation technology, any transcriptional errors that may result from process are unintentional.    Patient: Amy [name]  Service Category: E/M  Provider: Oswaldo Done, MD  DOB: 01-Jan-1946  DOS: 04/23/2023  Referring Provider: [facility]  MRN: [id number]  Specialty: Interventional Pain Management  PCP: [facility]  Type: Established Patient  Setting: Ambulatory outpatient    Location: Office  Delivery: Face-to-face     HPI  [name], a 77 y.o. year old female, is here today because of her Chronic upper back pain [M54.9, G89.29]. Ms. Enslow primary complain today is No chief complaint on file.  Pertinent problems: Ms. 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); Chronic neck pain (Right); Lumbar spondylosis; Lumbar facet syndrome; Lumbar facet hypertrophy; Epidural fibrosis; Epidural lipomatosis; Neurogenic pain; Occipital headaches; Failed back surgical syndrome (6); History of lumbar fusion; H/O nonunion of fracture; Trochanteric bursitis; Lumbar post-laminectomy syndrome; Sacroiliac  joint disease (Bilateral); Secondary malignant neoplasm of vertebral column (HCC); Thoracic radiculopathy open (T5/6) (Bilateral); Arthropathy; Displacement of lumbar intervertebral disc without myelopathy; DDD (degenerative disc disease), lumbosacral; Chronic thoracic back pain (2ry area of Pain) (Bilateral) (R>L); Thoracic foraminal stenosis (Bilateral); Abnormal CT of thoracic spine (06/25/2022); Abnormal MRI, thoracic spine (10/24/2021); Abnormal CT scan, lumbar spine (09/01/2021); Abnormal MRI, lumbar spine (04/13/2021); Chronic sacroiliitis (HCC) (Bilateral); Fusion of spine, thoracolumbar region (T10-L3); Chronic sacroiliac joint pain (Bilateral); Chronic upper back pain; Thoracic spine pain; Thoracic facet joint pain; Thoracic facet syndrome; Spondylosis without myelopathy or radiculopathy, thoracic region; Chronic hip pain (Left); Postlaminectomy syndrome, thoracic; and History of fusion of thoracic spine on their pertinent problem list. Pain Assessment: Severity of   is reported as a  /10. Location:    / . Onset:  . Quality:  . Timing:  . Modifying factor(s):  Marland Kitchen Vitals:  vitals were not taken for this visit.  BMI: Estimated body mass index is 46.93 kg/m as calculated from the following:   Height as of 04/09/23: 5\' 5"  (1.651 m).   Weight as of 04/09/23: 282 lb (127.9 kg). Last encounter: 04/04/2023. Last procedure: 04/09/2023.  Reason for encounter: post-procedure evaluation and assessment. ***  Post-procedure evaluation   Procedure: Thoracic facet medial branch block #1  Laterality: Bilateral (-50)  Level: T9, T10, T11, and T12 Medial Branch Level(s)  Imaging: Fluoroscopy-guided Spinal ([id number]) Anesthesia: Local anesthesia (1-2% Lidocaine) Anxiolysis: IV Versed 2.0 mg Sedation: Moderate Sedation None required. No Fentanyl administered.         DOS: 04/09/2023  Performed by: Oswaldo Done, MD  Purpose: Diagnostic/Therapeutic Indications: Thoracic back pain severe enough to  impact quality of life or function. Rationale (medical necessity): procedure needed and proper for the diagnosis and/or treatment of [name]'s medical symptoms and needs. 1. Chronic thoracic back pain (2ry area of Pain) (Bilateral) (R>L)   2.  Chronic upper back pain   3. Spondylosis without myelopathy or radiculopathy, thoracic region   4. Thoracic facet joint pain   5. Thoracic facet syndrome   6. Thoracic spine pain   7. Failed back surgical syndrome (6)   8. Fusion of spine, thoracolumbar region (T10-L3)   9. Postlaminectomy syndrome, thoracic   10. History of fusion of thoracic spine    NAS-11 Pain score:   Pre-procedure: 7 /10   Post-procedure: 0-No pain/10      Effectiveness:  Initial hour after procedure:   ***. Subsequent 4-6 hours post-procedure:   ***. Analgesia past initial 6 hours:   ***. Ongoing improvement:  Analgesic:  *** Function:    ***    ROM:    ***     Pharmacotherapy Assessment  Analgesic: None MME/day: 0 mg/day   Monitoring: [place] PMP: PDMP reviewed during this encounter.       Pharmacotherapy: No side-effects or adverse reactions reported. Compliance: No problems identified. Effectiveness: Clinically acceptable.  No notes on file  No results found for: "CBDTHCR" No results found for: "D8THCCBX" No results found for: "D9THCCBX"  UDS:  Summary  Date Value Ref Range Status  01/28/2023 Note  Final    Comment:    ==================================================================== Compliance Drug Analysis, Ur ==================================================================== Test                             Result       Flag       Units  Drug Present and Declared for Prescription Verification   Gabapentin                     PRESENT      EXPECTED   Paroxetine                     PRESENT      EXPECTED   Acetaminophen                  PRESENT      EXPECTED   Clonidine                      PRESENT      EXPECTED   Metoprolol                      PRESENT      EXPECTED  Drug Present not Declared for Prescription Verification   Ibuprofen                      PRESENT      UNEXPECTED  Drug Absent but Declared for Prescription Verification   Tramadol                       Not Detected UNEXPECTED ng/mg creat   Methocarbamol                  Not Detected UNEXPECTED   Diphenhydramine                Not Detected UNEXPECTED ==================================================================== Test                      Result    Flag   Units      Ref Range   Creatinine              61  mg/dL      >=02 ==================================================================== Declared Medications:  The flagging and interpretation on this report are based on the  following declared medications.  Unexpected results may arise from  inaccuracies in the declared medications.   **Note: The testing scope of this panel includes these medications:   Clonidine (Catapres)  Diphenhydramine (Benadryl)  Gabapentin (Neurontin)  Methocarbamol (Robaxin)  Metoprolol (Lopressor)  Paroxetine (Paxil)  Tramadol (Ultram)   **Note: The testing scope of this panel does not include small to  moderate amounts of these reported medications:   Acetaminophen (Tylenol)   **Note: The testing scope of this panel does not include the  following reported medications:   Atorvastatin (Lipitor)  Estradiol (Estrace)  Fluticasone (Flonase)  Furosemide (Lasix)  Hydralazine (Apresoline)  Hydrochlorothiazide (Hydrodiuril)  Levothyroxine (Synthroid)  Menthol  Nifedipine (Procardia)  Pantoprazole (Protonix)  Potassium (Klor-Con)  Vitamin E ==================================================================== For clinical consultation, please call [phone]. ====================================================================       ROS  Constitutional: Denies any fever or chills Gastrointestinal: No reported hemesis, hematochezia, vomiting,  or acute GI distress Musculoskeletal: Denies any acute onset joint swelling, redness, loss of ROM, or weakness Neurological: No reported episodes of acute onset apraxia, aphasia, dysarthria, agnosia, amnesia, paralysis, loss of coordination, or loss of consciousness  Medication Review  B Complex Vitamins, Menthol (Topical Analgesic), NIFEdipine, OVER THE COUNTER MEDICATION, PARoxetine, Potassium Chloride ER, acetaminophen, atorvastatin, carvedilol, cloNIDine, diphenhydrAMINE, estradiol, fluticasone, furosemide, gabapentin, hydrALAZINE, levothyroxine, losartan, methocarbamol, pantoprazole, and vitamin E  History Review  Allergy: Ms. Ozanich is allergic to latex, crestor [rosuvastatin calcium], penicillins, tetanus toxoids, and tomato. Drug: Ms. Gelb  reports no history of drug use. Alcohol:  reports that she does not currently use alcohol. Tobacco:  reports that she quit smoking about 53 years ago. Her smoking use included cigarettes. She started smoking about 58 years ago. She has a 1.3 pack-year smoking history. She has never used smokeless tobacco. Social: Ms. Oganesyan  reports that she quit smoking about 53 years ago. Her smoking use included cigarettes. She started smoking about 58 years ago. She has a 1.3 pack-year smoking history. She has never used smokeless tobacco. She reports that she does not currently use alcohol. She reports that she does not use drugs. Medical:  has a past medical history of Abdominal pain of unknown etiology (02/05/2016), Anxiety, Arthritis, Asthma, Chronic back pain, Complication of anesthesia, Constipation, Depression, Dysphonia, Esophageal dysmotility, Fibromyalgia, Fibromyalgia, GERD (gastroesophageal reflux disease), Glaucoma, Headache, High cholesterol, History of DVT (deep vein thrombosis) (1989), History of hiatal hernia, History of MRSA infection (2011), Hypertension, Hypothyroidism, Irregular heart rate, Low iron, Lumbago, Mild obstructive  sleep apnea, Neuropathy, Pelvic pain in female, Peripheral vascular disease (HCC), SBO (small bowel obstruction) (HCC) (06/2016), Seasonal allergies, Short of breath on exertion, Urinary frequency, Vaginal atrophy, and Weakness. Surgical: Ms. Gastelum  has a past surgical history that includes Abdominal hysterectomy (1978); Nose surgery (2007); Mass excision (Left, 02/20/2013); Esophagogastroduodenoscopy (N/A, 07/15/2013); abdominal adhesions removed; Colonoscopy; Bunionectomy (Left, 2008); Shoulder open rotator cuff repair (Left, 05/05/2014); Esophageal manometry (N/A, 11/22/2014); Lumbar fusion (2014); REMOVAL HARDWARE L4-L5/  BILATERAL LAMINECTOMY L2 - L5 AND FUSION (06-12-2011); Laparoscopic cholecystectomy (01-11-2006); cysto with hydrodistension (N/A, 07/12/2015); Eye surgery (Bilateral); Tonsillectomy; Hardware Removal (N/A, 01/02/2016); Spine surgery; laparoscopy (N/A, 07/06/2016); laparotomy (N/A, 07/06/2016); Back surgery; Colon surgery; Dilation and curettage of uterus; lipoma removal; Exploratory laparotomy; ingrown toenail removal; and Cataract extraction (Bilateral, 2017). Family: family history includes Aneurysm in her maternal grandmother; Cancer in her daughter, mother, and sister; Colon cancer in her  father; Colon polyps in her father; Diabetes in her mother; Hypertension in her brother, brother, father, mother, and sister; Kidney disease in her brother and mother; Liver cancer in her brother and brother; Osteoarthritis in her mother; Stroke in her sister.  Laboratory Chemistry Profile   Renal Lab Results  Component Value Date   BUN 23 04/08/2023   CREATININE 1.17 (H) 04/08/2023   BCR 15 12/05/2022   GFRAA 66 07/28/2020   GFRNONAA 48 (L) 04/08/2023    Hepatic Lab Results  Component Value Date   AST 21 09/29/2022   ALT 16 09/29/2022   ALBUMIN 3.8 09/29/2022   ALKPHOS 103 09/29/2022   LIPASE 34 03/20/2021    Electrolytes Lab Results  Component Value Date   NA 139  04/08/2023   K 4.6 04/08/2023   CL 103 04/08/2023   CALCIUM 9.5 04/08/2023   MG 2.0 09/29/2022   PHOS 3.6 07/27/2016    Bone Lab Results  Component Value Date   25OHVITD1 47 01/28/2023   25OHVITD2 <1.0 01/28/2023   25OHVITD3 47 01/28/2023    Inflammation (CRP: Acute Phase) (ESR: Chronic Phase) Lab Results  Component Value Date   CRP 1 01/28/2023   ESRSEDRATE 34 01/28/2023   LATICACIDVEN 1.09 07/21/2016         Note: Above Lab results reviewed.  Recent Imaging Review  DG PAIN CLINIC C-ARM 1-60 MIN NO REPORT Fluoro was used, but no Radiologist interpretation will be provided.  Please refer to "NOTES" tab for provider progress note. Note: Reviewed        Physical Exam  General appearance: Well nourished, well developed, and well hydrated. In no apparent acute distress Mental status: Alert, oriented x 3 (person, place, & time)       Respiratory: No evidence of acute respiratory distress Eyes: PERLA Vitals: There were no vitals taken for this visit. BMI: Estimated body mass index is 46.93 kg/m as calculated from the following:   Height as of 04/09/23: 5\' 5"  (1.651 m).   Weight as of 04/09/23: 282 lb (127.9 kg). Ideal: Ideal body weight: 57 kg (125 lb 10.6 oz) Adjusted ideal body weight: 85.4 kg (188 lb 3.2 oz)  Assessment   Diagnosis Status  1. Chronic upper back pain   2. Thoracic facet joint pain   3. Chronic thoracic back pain (2ry area of Pain) (Bilateral) (R>L)   4. Thoracic spine pain   5. Thoracic facet syndrome   6. Postop check    Controlled Controlled Controlled   Updated Problems: No problems updated.  Plan of Care  Problem-specific:  No problem-specific Assessment & Plan notes found for this encounter.  Ms. KALYSSA BROZEK has a current medication list which includes the following long-term medication(s): atorvastatin, carvedilol, clonidine, fluticasone, furosemide, hydralazine, levothyroxine, and nifedipine.  Pharmacotherapy  (Medications Ordered): No orders of the defined types were placed in this encounter.  Orders:  No orders of the defined types were placed in this encounter.  Follow-up plan:   No follow-ups on file.      Interventional Therapies  Risk Factors  Considerations:  Hx. suicide attempt  Lumbar Post-Laminectomy Syndrome x6            Thoracolumbar HARDWARE (NO RFA)     Planned  Pending:   Diagnostic bilateral Thoracic FCT (T9-12) MBB #1    Under consideration:   Diagnostic bilateral SI joint block #1  Diagnostic midline caudal ESI #1    Completed:   Diagnostic bilateral SI joint block x1 (03/12/2023)  Therapeutic  Palliative (PRN) options:   None established   Completed by other providers:   Therapeutic Nerve Blocks by [facility].        Recent Visits Date Type Provider Dept  04/09/23 Procedure visit Delano Metz, MD [facility]  04/04/23 Office Visit Delano Metz, MD [facility]  03/12/23 Procedure visit Delano Metz, MD [facility]  02/25/23 Office Visit Delano Metz, MD [facility]  01/28/23 Office Visit Delano Metz, MD [facility]  Showing recent visits within past 90 days and meeting all other requirements Future Appointments Date Type Provider Dept  04/23/23 Appointment Delano Metz, MD [facility]  Showing future appointments within next 90 days and meeting all other requirements  I discussed the assessment and treatment plan with the patient. The patient was provided an opportunity to ask questions and all were answered. The patient agreed with the plan and demonstrated an understanding of the instructions.  Patient advised to call back or seek an in-person evaluation if the symptoms or condition worsens.  Duration of encounter: *** minutes.  Total time on encounter, as per AMA guidelines included both the face-to-face and  non-face-to-face time personally spent by the physician and/or other qualified health care professional(s) on the day of the encounter (includes time in activities that require the physician or other qualified health care professional and does not include time in activities normally performed by clinical staff). Physician's time may include the following activities when performed: Preparing to see the patient (e.g., pre-charting review of records, searching for previously ordered imaging, lab work, and nerve conduction tests) Review of prior analgesic pharmacotherapies. Reviewing PMP Interpreting ordered tests (e.g., lab work, imaging, nerve conduction tests) Performing post-procedure evaluations, including interpretation of diagnostic procedures Obtaining and/or reviewing separately obtained history Performing a medically appropriate examination and/or evaluation Counseling and educating the patient/family/caregiver Ordering medications, tests, or procedures Referring and communicating with other health care professionals (when not separately reported) Documenting clinical information in the electronic or other health record Independently interpreting results (not separately reported) and communicating results to the patient/ family/caregiver Care coordination (not separately reported)  Note by: Oswaldo Done, MD Date: 04/23/2023; Time: 8:41 AM

## 2023-04-22 NOTE — Telephone Encounter (Signed)
Requested Prescriptions   Signed Prescriptions Disp Refills   furosemide (LASIX) 20 MG tablet 90 tablet 0    Sig: Take 1 tablet (20 mg total) by mouth daily.    Authorizing Provider: Debbe Odea    Ordering User: Feliberto Harts L   hydrALAZINE (APRESOLINE) 100 MG tablet 60 tablet 1    Sig: Take 1 tablet (100 mg total) by mouth 2 (two) times daily.    Authorizing Provider: Debbe Odea    Ordering User: Feliberto Harts L   carvedilol (COREG) 3.125 MG tablet 180 tablet 3    Sig: Take 1 tablet (3.125 mg total) by mouth 2 (two) times daily.    Authorizing Provider: Debbe Odea    Ordering User: Feliberto Harts L   NIFEdipine (PROCARDIA XL/NIFEDICAL-XL) 90 MG 24 hr tablet 90 tablet 3    Sig: Take 1 tablet (90 mg total) by mouth daily.    Authorizing Provider: Debbe Odea    Ordering User: Guerry Minors

## 2023-04-23 ENCOUNTER — Ambulatory Visit: Payer: Medicare Other | Attending: Pain Medicine | Admitting: Pain Medicine

## 2023-04-23 ENCOUNTER — Encounter: Payer: Self-pay | Admitting: Pain Medicine

## 2023-04-23 VITALS — BP 131/73 | HR 93 | Temp 97.5°F | Resp 18 | Ht 65.0 in | Wt 282.0 lb

## 2023-04-23 DIAGNOSIS — M47816 Spondylosis without myelopathy or radiculopathy, lumbar region: Secondary | ICD-10-CM | POA: Insufficient documentation

## 2023-04-23 DIAGNOSIS — G8929 Other chronic pain: Secondary | ICD-10-CM | POA: Insufficient documentation

## 2023-04-23 DIAGNOSIS — M5459 Other low back pain: Secondary | ICD-10-CM | POA: Diagnosis not present

## 2023-04-23 DIAGNOSIS — M47894 Other spondylosis, thoracic region: Secondary | ICD-10-CM | POA: Insufficient documentation

## 2023-04-23 DIAGNOSIS — M549 Dorsalgia, unspecified: Secondary | ICD-10-CM | POA: Diagnosis not present

## 2023-04-23 DIAGNOSIS — M546 Pain in thoracic spine: Secondary | ICD-10-CM | POA: Diagnosis not present

## 2023-04-23 DIAGNOSIS — Z09 Encounter for follow-up examination after completed treatment for conditions other than malignant neoplasm: Secondary | ICD-10-CM | POA: Insufficient documentation

## 2023-04-23 DIAGNOSIS — M47817 Spondylosis without myelopathy or radiculopathy, lumbosacral region: Secondary | ICD-10-CM | POA: Diagnosis not present

## 2023-04-23 DIAGNOSIS — M545 Low back pain, unspecified: Secondary | ICD-10-CM | POA: Insufficient documentation

## 2023-04-23 DIAGNOSIS — M5137 Other intervertebral disc degeneration, lumbosacral region: Secondary | ICD-10-CM | POA: Diagnosis not present

## 2023-04-23 NOTE — Patient Instructions (Addendum)
Facet Joint Block The facet joints connect the bones of the spine (vertebrae). They let you bend, twist, and make other movements with your spine. They also keep you from bending too far, twisting too far, and making other extreme movements. A facet joint block is a procedure where a numbing medicine (local anesthesia) is injected into a facet joint. Many times, a medicine for inflammation (steroid) is also injected. A facet joint block may be done: To diagnose neck or back pain. If the pain gets better after a facet joint block, the pain is likely coming from the facet joint. If the pain does not get better, the pain is likely not coming from the facet joint. To treat neck or back pain caused by an inflamed facet joint. To help you with physical therapy or other rehab (rehabilitation) exercises. Tell a health care provider about: Any allergies you have. All medicines you are taking, including vitamins, herbs, eye drops, creams, and over-the-counter medicines. Any problems you or family members have had with anesthesia. Any bleeding problems you have. Any surgeries you have had. Any medical conditions you have or have had. Whether you are pregnant or may be pregnant. What are the risks? Your health care provider will talk with you about risks. These may include: Infection. Allergic reactions to medicines or dyes. Bleeding. Injury to a nerve near where the needle was put in (injection site). Pain at the injection site. Short-term weakness or numbness in areas near the nerves at the injection site. What happens before the procedure? When to stop eating and drinking Follow instructions from your health care provider about what you may eat and drink. Medicines Ask your health care provider about: Changing or stopping your regular medicines. These include any diabetes medicines or blood thinners you take. Taking medicines such as aspirin and ibuprofen. These medicines can thin your blood. Do  not take these medicines unless your health care provider tells you to. Taking over-the-counter medicines, vitamins, herbs, and supplements. General instructions If you will be going home right after the procedure, plan to have a responsible adult: Take you home from the hospital or clinic. You will not be allowed to drive. Care for you for the time you are told. Ask your health care provider: How your injection site will be marked. What steps will be taken to help prevent infection. These may include washing skin with a soap that kills germs. What happens during the procedure?  An IV will be inserted into one of your veins. You will lie on your stomach on an X-ray table. You may be asked to lie in a different position if you will be getting an injection in your neck. Your injection site will be cleaned with a soap that kills germs and then covered with a germ-free (sterile) drape. A local anesthesia will be put in at the injection site. A type of X-ray machine (fluoroscopy) or CT scan will be used to help find your facet joint. A contrast dye may also be injected into your joint to help show if the needle is at the joint. When your provider knows the needle is at your joint, they will inject anesthesia and anti-inflammatory medicine as needed. The needle will be removed. Pressure will be applied to keep your injection site from bleeding. A bandage (dressing) will be placed over each injection site. The procedure may vary among health care providers and hospitals. What happens after the procedure? Your blood pressure, heart rate, breathing rate, and blood oxygen level  will be monitored until you leave the hospital or clinic. This information is not intended to replace advice given to you by your health care provider. Make sure you discuss any questions you have with your health care provider. Document Revised: 03/23/2022 Document Reviewed: 03/23/2022 Else[employer] Patient Education  2024  [employer]  ______________________________________________________________________  Procedure instructions  Do not eat or drink fluids (other than water) for 6 hours before your procedure  No water for 2 hours before your procedure  Take your blood pressure medicine with a sip of water  Arrive 30 minutes before your appointment  Carefully read the "Preparing for your procedure" detailed instructions  If you have questions call us at [phone]  _____________________________________________________________________    ______________________________________________________________________  Preparing for your procedure  Appointments: If you think you may not be able to keep your appointment, call 24-48 hours in advance to cancel. We need time to make it available to others.  During your procedure appointment there will be: No Prescription Refills. No disability issues to discussed. No medication changes or discussions.  Instructions: Food intake: Avoid eating anything solid for at least 8 hours prior to your procedure. Clear liquid intake: You may take clear liquids such as water up to 2 hours prior to your procedure. (No carbonated drinks. No soda.) Transportation: Unless otherwise stated by your physician, bring a driver. Morning Medicines: Except for blood thinners, take all of your other morning medications with a sip of water. Make sure to take your heart and blood pressure medicines. If your blood pressure's lower number is above 100, the case will be rescheduled. Blood thinners: Make sure to stop your blood thinners as instructed.  If you take a blood thinner, but were not instructed to stop it, call our office [phone] and ask to talk to a nurse. Not stopping a blood thinner prior to certain procedures could lead to serious complications. Diabetics on insulin: Notify the staff so that you can be scheduled 1st case in the morning. If your diabetes requires high  dose insulin, take only  of your normal insulin dose the morning of the procedure and notify the staff that you have done so. Preventing infections: Shower with an antibacterial soap the morning of your procedure.  Build-up your immune system: Take 1000 mg of Vitamin C with every meal (3 times a day) the day prior to your procedure. Antibiotics: Inform the nursing staff if you are taking any antibiotics or if you have any conditions that may require antibiotics prior to procedures. (Example: recent joint implants)   Pregnancy: If you are pregnant make sure to notify the nursing staff. Not doing so may result in injury to the fetus, including death.  Sickness: If you have a cold, fever, or any active infections, call and cancel or reschedule your procedure. Receiving steroids while having an infection may result in complications. Arrival: You must be in the facility at least 30 minutes prior to your scheduled procedure. Tardiness: Your scheduled time is also the cutoff time. If you do not arrive at least 15 minutes prior to your procedure, you will be rescheduled.  Children: Do not bring any children with you. Make arrangements to keep them home. Dress appropriately: There is always a possibility that your clothing may get soiled. Avoid long dresses. Valuables: Do not bring any jewelry or valuables.  Reasons to call and reschedule or cancel your procedure: (Following these recommendations will minimize the risk of a serious complication.) Surgeries: Avoid having procedures within  2 weeks of any surgery. (Avoid for 2 weeks before or after any surgery). Flu Shots: Avoid having procedures within 2 weeks of a flu shots or . (Avoid for 2 weeks before or after immunizations). Barium: Avoid having a procedure within 7-10 days after having had a radiological study involving the use of radiological contrast. (Myelograms, Barium swallow or enema study). Heart attacks: Avoid any elective procedures or surgeries  for the initial 6 months after a "Myocardial Infarction" (Heart Attack). Blood thinners: It is imperative that you stop these medications before procedures. Let us know if you if you take any blood thinner.  Infection: Avoid procedures during or within two weeks of an infection (including chest colds or gastrointestinal problems). Symptoms associated with infections include: Localized redness, fever, chills, night sweats or profuse sweating, burning sensation when voiding, cough, congestion, stuffiness, runny nose, sore throat, diarrhea, nausea, vomiting, cold or Flu symptoms, recent or current infections. It is specially important if the infection is over the area that we intend to treat. Heart and lung problems: Symptoms that may suggest an active cardiopulmonary problem include: cough, chest pain, breathing difficulties or shortness of breath, dizziness, ankle swelling, uncontrolled high or unusually low blood pressure, and/or palpitations. If you are experiencing any of these symptoms, cancel your procedure and contact your primary care physician for an evaluation.  Remember:  Regular Business hours are:  Monday to Thursday 8:00 AM to 4:00 PM  Provider's Schedule: Delano Metz, MD:  Procedure days: Tuesday and Thursday 7:30 AM to 4:00 PM  Edward Jolly, MD:  Procedure days: Monday and Wednesday 7:30 AM to 4:00 PM  ______________________________________________________________________    ____________________________________________________________________________________________  General Risks and Possible Complications  Patient Responsibilities: It is important that you read this as it is part of your informed consent. It is our duty to inform you of the risks and possible complications associated with treatments offered to you. It is your responsibility as a patient to read this and to ask questions about anything that is not clear or that you believe was not covered in this  document.  Patient's Rights: You have the right to refuse treatment. You also have the right to change your mind, even after initially having agreed to have the treatment done. However, under this last option, if you wait until the last second to change your mind, you may be charged for the materials used up to that point.  Introduction: Medicine is not an Visual merchandiser. Everything in Medicine, including the lack of treatment(s), carries the potential for danger, harm, or loss (which is by definition: Risk). In Medicine, a complication is a secondary problem, condition, or disease that can aggravate an already existing one. All treatments carry the risk of possible complications. The fact that a side effects or complications occurs, does not imply that the treatment was conducted incorrectly. It must be clearly understood that these can happen even when everything is done following the highest safety standards.  No treatment: You can choose not to proceed with the proposed treatment alternative. The "PRO(s)" would include: avoiding the risk of complications associated with the therapy. The "CON(s)" would include: not getting any of the treatment benefits. These benefits fall under one of three categories: diagnostic; therapeutic; and/or palliative. Diagnostic benefits include: getting information which can ultimately lead to improvement of the disease or symptom(s). Therapeutic benefits are those associated with the successful treatment of the disease. Finally, palliative benefits are those related to the decrease of the primary symptoms, without necessarily curing  the condition (example: decreasing the pain from a flare-up of a chronic condition, such as incurable terminal cancer).  General Risks and Complications: These are associated to most interventional treatments. They can occur alone, or in combination. They fall under one of the following six (6) categories: no benefit or worsening of symptoms;  bleeding; infection; nerve damage; allergic reactions; and/or death. No benefits or worsening of symptoms: In Medicine there are no guarantees, only probabilities. No healthcare provider can ever guarantee that a medical treatment will work, they can only state the probability that it may. Furthermore, there is always the possibility that the condition may worsen, either directly, or indirectly, as a consequence of the treatment. Bleeding: This is more common if the patient is taking a blood thinner, either prescription or over the counter (example: Goody Powders, Fish oil, Aspirin, Garlic, etc.), or if suffering a condition associated with impaired coagulation (example: Hemophilia, cirrhosis of the liver, low platelet counts, etc.). However, even if you do not have one on these, it can still happen. If you have any of these conditions, or take one of these drugs, make sure to notify your treating physician. Infection: This is more common in patients with a compromised immune system, either due to disease (example: diabetes, cancer, human immunodeficiency virus [HIV], etc.), or due to medications or treatments (example: therapies used to treat cancer and rheumatological diseases). However, even if you do not have one on these, it can still happen. If you have any of these conditions, or take one of these drugs, make sure to notify your treating physician. Nerve Damage: This is more common when the treatment is an invasive one, but it can also happen with the use of medications, such as those used in the treatment of cancer. The damage can occur to small secondary nerves, or to large primary ones, such as those in the spinal cord and brain. This damage may be temporary or permanent and it may lead to impairments that can range from temporary numbness to permanent paralysis and/or brain death. Allergic Reactions: Any time a substance or material comes in contact with our body, there is the possibility of an  allergic reaction. These can range from a mild skin rash (contact dermatitis) to a severe systemic reaction (anaphylactic reaction), which can result in death. Death: In general, any medical intervention can result in death, most of the time due to an unforeseen complication. ____________________________________________________________________________________________

## 2023-04-26 DIAGNOSIS — Z6841 Body Mass Index (BMI) 40.0 and over, adult: Secondary | ICD-10-CM | POA: Diagnosis not present

## 2023-04-26 DIAGNOSIS — I700 Atherosclerosis of aorta: Secondary | ICD-10-CM | POA: Diagnosis not present

## 2023-04-26 DIAGNOSIS — Z0000 Encounter for general adult medical examination without abnormal findings: Secondary | ICD-10-CM | POA: Diagnosis not present

## 2023-04-26 DIAGNOSIS — M2560 Stiffness of unspecified joint, not elsewhere classified: Secondary | ICD-10-CM | POA: Diagnosis not present

## 2023-04-26 DIAGNOSIS — G609 Hereditary and idiopathic neuropathy, unspecified: Secondary | ICD-10-CM | POA: Diagnosis not present

## 2023-04-26 DIAGNOSIS — R109 Unspecified abdominal pain: Secondary | ICD-10-CM | POA: Diagnosis not present

## 2023-04-26 DIAGNOSIS — I5033 Acute on chronic diastolic (congestive) heart failure: Secondary | ICD-10-CM | POA: Diagnosis not present

## 2023-04-26 DIAGNOSIS — E039 Hypothyroidism, unspecified: Secondary | ICD-10-CM | POA: Diagnosis not present

## 2023-04-26 DIAGNOSIS — E7800 Pure hypercholesterolemia, unspecified: Secondary | ICD-10-CM | POA: Diagnosis not present

## 2023-04-26 DIAGNOSIS — F339 Major depressive disorder, recurrent, unspecified: Secondary | ICD-10-CM | POA: Diagnosis not present

## 2023-04-26 DIAGNOSIS — I10 Essential (primary) hypertension: Secondary | ICD-10-CM | POA: Diagnosis not present

## 2023-04-30 ENCOUNTER — Telehealth: Payer: Self-pay

## 2023-04-30 NOTE — Telephone Encounter (Signed)
Insurance Treatment Denial Note  Date order was entered:  Order entered by: Delano Metz, MD Requested treatment: lumbar facets Reason for denial:  Insufficient pain score Recommended for approval:  higher pain score   Medicare states insufficient documentation of pain or disability score that supports moderate to severe neck or low back pain. Her pain documented at visit was a 3.

## 2023-05-01 ENCOUNTER — Ambulatory Visit
Admission: RE | Admit: 2023-05-01 | Discharge: 2023-05-01 | Disposition: A | Discharge status: Home / Self Care | Payer: Medicare Other | Source: Ambulatory Visit | Attending: Student in an Organized Health Care Education/Training Program | Admitting: Student in an Organized Health Care Education/Training Program

## 2023-05-01 DIAGNOSIS — R079 Chest pain, unspecified: Secondary | ICD-10-CM | POA: Diagnosis not present

## 2023-05-01 DIAGNOSIS — R911 Solitary pulmonary nodule: Secondary | ICD-10-CM | POA: Insufficient documentation

## 2023-05-06 ENCOUNTER — Other Ambulatory Visit: Payer: Self-pay | Admitting: Internal Medicine

## 2023-05-06 DIAGNOSIS — Z13820 Encounter for screening for osteoporosis: Secondary | ICD-10-CM

## 2023-05-20 DIAGNOSIS — T85848S Pain due to other internal prosthetic devices, implants and grafts, sequela: Secondary | ICD-10-CM | POA: Insufficient documentation

## 2023-05-20 NOTE — Progress Notes (Unsigned)
PROVIDER NOTE: Interpretation of information contained herein should be left to medically-trained personnel. Specific patient instructions are provided elsewhere under "Patient Instructions" section of medical record. This document was created in part using STT-dictation technology, any transcriptional errors that may result from this process are unintentional.  Patient: Amy [name] Type: Established DOB: 02/17/1946 MRN: [id number] PCP: Ollen Bowl, MD  Service: Procedure DOS: 05/21/2023 Setting: Ambulatory Location: Ambulatory outpatient facility Delivery: Face-to-face Provider: Oswaldo Done, MD Specialty: Interventional Pain Management Specialty designation: 09 Location: Outpatient facility Ref. Prov.: Delano Metz, MD       Interventional Therapy   Procedure: Lumbar Facet, Medial Branch Block(s) #1  Laterality: Bilateral  Level: L2, L3, L4, L5, and S1 Medial Branch Level(s). Injecting these levels blocks the L3-4, L4-5, and L5-S1 lumbar facet joints. Imaging: Fluoroscopic guidance         Anesthesia: Local anesthesia (1-2% Lidocaine) Anxiolysis: IV Versed         Sedation: Moderate Sedation                       DOS: 05/21/2023 Performed by: Oswaldo Done, MD  Primary Purpose: Diagnostic/Therapeutic Indications: Low back pain severe enough to impact quality of life or function. 1. Chronic low back pain (1ry area of Pain) (Bilateral) (R>L) w/o sciatica   2. Lumbar facet joint pain   3. Lumbar facet syndrome   4. Spondylosis without myelopathy or radiculopathy, lumbosacral region   5. Lumbar facet hypertrophy   6. Failed back surgical syndrome (6)   7. Fusion of spine, thoracolumbar region (T10-L3)   8. DDD (degenerative disc disease), lumbosacral   9. Morbid obesity with body mass index of 45.0-49.9 in adult [facility])    NAS-11 Pain score:   Pre-procedure: 9 /10   Post-procedure: 0-No pain/10     Position / Prep / Materials:  Position:  Prone  Prep solution: DuraPrep (Iodine Povacrylex [0.7% available iodine] and Isopropyl Alcohol, 74% w/w) Area Prepped: Posterolateral Lumbosacral Spine (Wide prep: From the lower border of the scapula down to the end of the tailbone and from flank to flank.)  Materials:  Tray: Block Needle(s):  Type: Spinal  Gauge (G): 22  Length: 5-in Qty: 4      H&P (Pre-op Assessment):  Amy [name] is a 77 y.o. (year old), female patient, seen today for interventional treatment. She  has a past surgical history that includes Abdominal hysterectomy (1978); Nose surgery (2007); Mass excision (Left, 02/20/2013); Esophagogastroduodenoscopy (N/A, 07/15/2013); abdominal adhesions removed; Colonoscopy; Bunionectomy (Left, 2008); Shoulder open rotator cuff repair (Left, 05/05/2014); Esophageal manometry (N/A, 11/22/2014); Lumbar fusion (2014); REMOVAL HARDWARE L4-L5/  BILATERAL LAMINECTOMY L2 - L5 AND FUSION (06-12-2011); Laparoscopic cholecystectomy (01-11-2006); cysto with hydrodistension (N/A, 07/12/2015); Eye surgery (Bilateral); Tonsillectomy; Hardware Removal (N/A, 01/02/2016); Spine surgery; laparoscopy (N/A, 07/06/2016); laparotomy (N/A, 07/06/2016); Back surgery; Colon surgery; Dilation and curettage of uterus; lipoma removal; Exploratory laparotomy; ingrown toenail removal; and Cataract extraction (Bilateral, 2017). Amy [name] has a current medication list which includes the following prescription(s): acetaminophen, atorvastatin, b complex vitamins, carvedilol, clonidine, diphenhydramine, estradiol, fluticasone, furosemide, gabapentin, hydralazine, levothyroxine, losartan, menthol (topical analgesic), methocarbamol, nifedipine, OVER THE COUNTER MEDICATION, pantoprazole, paroxetine, potassium chloride er, and vitamin e, and the following Facility-Administered Medications: fentanyl and lactated ringers. Her primarily concern today is the Back Pain  Initial Vital Signs:  Pulse/HCG Rate: (!) 103ECG  Heart Rate: 86 (NSR) Temp: 98.2 F (36.8 C) Resp: 18 BP: 124/81 SpO2: 100 %  BMI: Estimated body mass index  is 46.93 kg/m as calculated from the following:   Height as of this encounter: 5\' 5"  (1.651 m).   Weight as of this encounter: 282 lb (127.9 kg).  Risk Assessment: Allergies: Reviewed. She is allergic to latex, crestor [rosuvastatin calcium], penicillins, tetanus toxoids, and tomato.  Allergy Precautions: None required Coagulopathies: Reviewed. None identified.  Blood-thinner therapy: None at this time Active Infection(s): Reviewed. None identified. Amy [name] is afebrile  Site Confirmation: Amy [name] was asked to confirm the procedure and laterality before marking the site Procedure checklist: Completed Consent: Before the procedure and under the influence of no sedative(s), amnesic(s), or anxiolytics, the patient was informed of the treatment options, risks and possible complications. To fulfill our ethical and legal obligations, as recommended by the American Medical Association's Code of Ethics, I have informed the patient of my clinical impression; the nature and purpose of the treatment or procedure; the risks, benefits, and possible complications of the intervention; the alternatives, including doing nothing; the risk(s) and benefit(s) of the alternative treatment(s) or procedure(s); and the risk(s) and benefit(s) of doing nothing. The patient was provided information about the general risks and possible complications associated with the procedure. These may include, but are not limited to: failure to achieve desired goals, infection, bleeding, organ or nerve damage, allergic reactions, paralysis, and death. In addition, the patient was informed of those risks and complications associated to Spine-related procedures, such as failure to decrease pain; infection (i.e.: Meningitis, epidural or intraspinal abscess); bleeding (i.e.: epidural hematoma, subarachnoid  hemorrhage, or any other type of intraspinal or peri-dural bleeding); organ or nerve damage (i.e.: Any type of peripheral nerve, nerve root, or spinal cord injury) with subsequent damage to sensory, motor, and/or autonomic systems, resulting in permanent pain, numbness, and/or weakness of one or several areas of the body; allergic reactions; (i.e.: anaphylactic reaction); and/or death. Furthermore, the patient was informed of those risks and complications associated with the medications. These include, but are not limited to: allergic reactions (i.e.: anaphylactic or anaphylactoid reaction(s)); adrenal axis suppression; blood sugar elevation that in diabetics may result in ketoacidosis or comma; water retention that in patients with history of congestive heart failure may result in shortness of breath, pulmonary edema, and decompensation with resultant heart failure; weight gain; swelling or edema; medication-induced neural toxicity; particulate matter embolism and blood vessel occlusion with resultant organ, and/or nervous system infarction; and/or aseptic necrosis of one or more joints. Finally, the patient was informed that Medicine is not an exact science; therefore, there is also the possibility of unforeseen or unpredictable risks and/or possible complications that may result in a catastrophic outcome. The patient indicated having understood very clearly. We have given the patient no guarantees and we have made no promises. Enough time was given to the patient to ask questions, all of which were answered to the patient's satisfaction. Amy [name] has indicated that she wanted to continue with the procedure. Attestation: I, the ordering provider, attest that I have discussed with the patient the benefits, risks, side-effects, alternatives, likelihood of achieving goals, and potential problems during recovery for the procedure that I have provided informed consent. Date  Time: 05/21/2023  9:36  AM   Pre-Procedure Preparation:  Monitoring: As per clinic protocol. Respiration, ETCO2, SpO2, BP, heart rate and rhythm monitor placed and checked for adequate function Safety Precautions: Patient was assessed for positional comfort and pressure points before starting the procedure. Time-out: I initiated and conducted the "Time-out" before starting the procedure, as per protocol. The patient was  asked to participate by confirming the accuracy of the "Time Out" information. Verification of the correct person, site, and procedure were performed and confirmed by me, the nursing staff, and the patient. "Time-out" conducted as per Joint Commission's Universal Protocol (UP.01.01.01). Time: 1152 Start Time: 1152 hrs.  Description of Procedure:          Laterality: (see above) Targeted Levels: (see above)  Safety Precautions: Aspiration looking for blood return was conducted prior to all injections. At no point did we inject any substances, as a needle was being advanced. Before injecting, the patient was told to immediately notify me if she was experiencing any new onset of "ringing in the ears, or metallic taste in the mouth". No attempts were made at seeking any paresthesias. Safe injection practices and needle disposal techniques used. Medications properly checked for expiration dates. SDV (single dose vial) medications used. After the completion of the procedure, all disposable equipment used was discarded in the proper designated medical waste containers. Local Anesthesia: Protocol guidelines were followed. The patient was positioned over the fluoroscopy table. The area was prepped in the usual manner. The time-out was completed. The target area was identified using fluoroscopy. A 12-in long, straight, sterile hemostat was used with fluoroscopic guidance to locate the targets for each level blocked. Once located, the skin was marked with an approved surgical skin marker. Once all sites were marked, the  skin (epidermis, dermis, and hypodermis), as well as deeper tissues (fat, connective tissue and muscle) were infiltrated with a small amount of a short-acting local anesthetic, loaded on a 10cc syringe with a 25G, 1.5-in  Needle. An appropriate amount of time was allowed for local anesthetics to take effect before proceeding to the next step. Local Anesthetic: Lidocaine 2.0% The unused portion of the local anesthetic was discarded in the proper designated containers. Technical description of process:  L2 Medial Branch Nerve Block (MBB): The target area for the L2 medial branch is at the junction of the postero-lateral aspect of the superior articular process and the superior, posterior, and medial edge of the transverse process of L3. Under fluoroscopic guidance, a Quincke needle was inserted until contact was made with os over the superior postero-lateral aspect of the pedicular shadow (target area). After negative aspiration for blood, 0.5 mL of the nerve block solution was injected without difficulty or complication. The needle was removed intact. L3 Medial Branch Nerve Block (MBB): The target area for the L3 medial branch is at the junction of the postero-lateral aspect of the superior articular process and the superior, posterior, and medial edge of the transverse process of L4. Under fluoroscopic guidance, a Quincke needle was inserted until contact was made with os over the superior postero-lateral aspect of the pedicular shadow (target area). After negative aspiration for blood, 0.5 mL of the nerve block solution was injected without difficulty or complication. The needle was removed intact. L4 Medial Branch Nerve Block (MBB): The target area for the L4 medial branch is at the junction of the postero-lateral aspect of the superior articular process and the superior, posterior, and medial edge of the transverse process of L5. Under fluoroscopic guidance, a Quincke needle was inserted until contact was  made with os over the superior postero-lateral aspect of the pedicular shadow (target area). After negative aspiration for blood, 0.5 mL of the nerve block solution was injected without difficulty or complication. The needle was removed intact. L5 Medial Branch Nerve Block (MBB): The target area for the L5 medial branch is  at the junction of the postero-lateral aspect of the superior articular process and the superior, posterior, and medial edge of the sacral ala. Under fluoroscopic guidance, a Quincke needle was inserted until contact was made with os over the superior postero-lateral aspect of the pedicular shadow (target area). After negative aspiration for blood, 0.5 mL of the nerve block solution was injected without difficulty or complication. The needle was removed intact. S1 Medial Branch Nerve Block (MBB): The target area for the S1 medial branch is at the posterior and inferior 6 o'clock position of the L5-S1 facet joint. Under fluoroscopic guidance, the Quincke needle inserted for the L5 MBB was redirected until contact was made with os over the inferior and postero aspect of the sacrum, at the 6 o' clock position under the L5-S1 facet joint (Target area). After negative aspiration for blood, 0.5 mL of the nerve block solution was injected without difficulty or complication. The needle was removed intact.  Once the entire procedure was completed, the treated area was cleaned, making sure to leave some of the prepping solution back to take advantage of its long term bactericidal properties.         Illustration of the posterior view of the lumbar spine and the posterior neural structures. Laminae of L2 through S1 are labeled. DPRL5, dorsal primary ramus of L5; DPRS1, dorsal primary ramus of S1; DPR3, dorsal primary ramus of L3; FJ, facet (zygapophyseal) joint L3-L4; I, inferior articular process of L4; LB1, lateral branch of dorsal primary ramus of L1; IAB, inferior articular branches from L3  medial branch (supplies L4-L5 facet joint); IBP, intermediate branch plexus; MB3, medial branch of dorsal primary ramus of L3; NR3, third lumbar nerve root; S, superior articular process of L5; SAB, superior articular branches from L4 (supplies L4-5 facet joint also); TP3, transverse process of L3.   Facet Joint Innervation (* possible contribution)  L1-2 T12, L1 (L2*)  Medial Branch  L2-3 L1, L2 (L3*)         "          "  L3-4 L2, L3 (L4*)         "          "  L4-5 L3, L4 (L5*)         "          "  L5-S1 L4, L5, S1          "          "    Vitals:   05/21/23 1155 05/21/23 1201 05/21/23 1211 05/21/23 1221  BP: 127/88 134/88 127/72 129/62  Pulse:      Resp: 18 17 15 16   Temp:      TempSrc:      SpO2: 98% 99% 100% 100%  Weight:      Height:         End Time: 1200 hrs.  Imaging Guidance (Spinal):          Type of Imaging Technique: Fluoroscopy Guidance (Spinal) Indication(s): Assistance in needle guidance and placement for procedures requiring needle placement in or near specific anatomical locations not easily accessible without such assistance. Exposure Time: Please see nurses notes. Contrast: None used. Fluoroscopic Guidance: I was personally present during the use of fluoroscopy. "Tunnel Vision Technique" used to obtain the best possible view of the target area. Parallax error corrected before commencing the procedure. "Direction-depth-direction" technique used to introduce the needle under continuous pulsed fluoroscopy. Once target was reached, antero-posterior, oblique, and  lateral fluoroscopic projection used confirm needle placement in all planes. Images permanently stored in EMR. Interpretation: No contrast injected. I personally interpreted the imaging intraoperatively. Adequate needle placement confirmed in multiple planes. Permanent images saved into the patient's record.  Post-operative Assessment:  Post-procedure Vital Signs:  Pulse/HCG Rate: (!) 10388 Temp: 98.2 F  (36.8 C) Resp: 16 BP: 129/62 SpO2: 100 %  EBL: None  Complications: No immediate post-treatment complications observed by team, or reported by patient.  Note: The patient tolerated the entire procedure well. A repeat set of vitals were taken after the procedure and the patient was kept under observation following institutional policy, for this type of procedure. Post-procedural neurological assessment was performed, showing return to baseline, prior to discharge. The patient was provided with post-procedure discharge instructions, including a section on how to identify potential problems. Should any problems arise concerning this procedure, the patient was given instructions to immediately contact us, at any time, without hesitation. In any case, we plan to contact the patient by telephone for a follow-up status report regarding this interventional procedure.  Comments:  No additional relevant information.  Plan of Care (POC)  Orders:  Orders Placed This Encounter  Procedures   LUMBAR FACET(MEDIAL BRANCH NERVE BLOCK) MBNB    Scheduling Instructions:     Procedure: Lumbar facet block (AKA.: Lumbosacral medial branch nerve block)     Side: Bilateral     Level: L3-4, L4-5, L5-S1, and TBD Facets (L2, L3, L4, L5, S1, and TBD Medial Branch Nerves)     Sedation: Patient's choice.     Timeframe: Today    Order Specific Question:   Where will this procedure be performed?    Answer:   [facility]   DG PAIN [facility] C-ARM 1-60 MIN NO REPORT    Intraoperative interpretation by procedural physician at [facility].    Standing Status:   Standing    Number of Occurrences:   1    Order Specific Question:   Reason for exam:    Answer:   Assistance in needle guidance and placement for procedures requiring needle placement in or near specific anatomical locations not easily accessible without such assistance.   Informed Consent Details: Physician/Practitioner Attestation; Transcribe  to consent form and obtain patient signature    Nursing Order: Transcribe to consent form and obtain patient signature. Note: Always confirm laterality of pain with Ms. Craver, before procedure.    Order Specific Question:   Physician/Practitioner attestation of informed consent for procedure/surgical case    Answer:   I, the physician/practitioner, attest that I have discussed with the patient the benefits, risks, side effects, alternatives, likelihood of achieving goals and potential problems during recovery for the procedure that I have provided informed consent.    Order Specific Question:   Procedure    Answer:   Lumbar Facet Block  under fluoroscopic guidance    Order Specific Question:   Physician/Practitioner performing the procedure    Answer:   Alyiah Ulloa A. Laban Emperor MD    Order Specific Question:   Indication/Reason    Answer:   Low Back Pain, with our without leg pain, due to Facet Joint Arthralgia (Joint Pain) Spondylosis (Arthritis of the Spine), without myelopathy or radiculopathy (Nerve Damage).   Provide equipment / supplies at bedside    Procedure tray: "Block Tray" (Disposable  single use) Skin infiltration needle: Regular 1.5-in, 25-G, (x1) Block Needle type: Spinal Amount/quantity: 4 Size: Medium (5-inch) Gauge: 22G    Standing Status:  Standing    Number of Occurrences:   1    Order Specific Question:   Specify    Answer:   Block Tray   Chronic Opioid Analgesic:  None MME/day: 0 mg/day   Medications ordered for procedure: Meds ordered this encounter  Medications   lidocaine (XYLOCAINE) 2 % (with pres) injection 400 mg   pentafluoroprop-tetrafluoroeth (GEBAUERS) aerosol   lactated ringers infusion   midazolam (VERSED) 5 MG/5ML injection 0.5-2 mg    Make sure Flumazenil is available in the pyxis when using this medication. If oversedation occurs, administer 0.2 mg IV over 15 sec. If after 45 sec no response, administer 0.2 mg again over 1 min; may repeat  at 1 min intervals; not to exceed 4 doses (1 mg)   fentaNYL (SUBLIMAZE) injection 25-50 mcg    Make sure Narcan is available in the pyxis when using this medication. In the event of respiratory depression (RR< 8/min): Titrate NARCAN (naloxone) in increments of 0.1 to 0.2 mg IV at 2-3 minute intervals, until desired degree of reversal.   ropivacaine (PF) 2 mg/mL (0.2%) (NAROPIN) injection 18 mL   triamcinolone acetonide (KENALOG-40) injection 80 mg   Medications administered: We administered lidocaine, pentafluoroprop-tetrafluoroeth, lactated ringers, midazolam, fentaNYL, ropivacaine (PF) 2 mg/mL (0.2%), and triamcinolone acetonide.  See the medical record for exact dosing, route, and time of administration.  Follow-up plan:   Return in about 2 weeks (around 06/04/2023) for (Face2F), (PPE).       Interventional Therapies  Risk Factors  Considerations:  Hx. suicide attempt  Lumbar Post-Laminectomy Syndrome x6            Thoracolumbar HARDWARE (NO RFA)     Planned  Pending:   Diagnostic bilateral Thoracic FCT (T9-12) MBB #1    Under consideration:   Diagnostic bilateral SI joint block #1  Diagnostic midline caudal ESI #1    Completed:   Diagnostic bilateral SI joint block x1 (03/12/2023)    Therapeutic  Palliative (PRN) options:   None established   Completed by other providers:   Therapeutic Nerve Blocks by [facility].         Recent Visits Date Type Provider Dept  04/23/23 Office Visit Delano Metz, MD [facility]  04/09/23 Procedure visit Delano Metz, MD [facility]  04/04/23 Office Visit Delano Metz, MD [facility]  03/12/23 Procedure visit Delano Metz, MD [facility]  02/25/23 Office Visit Delano Metz, MD [facility]  Showing recent visits within past 90 days and meeting all other requirements Today's Visits Date Type Provider Dept  05/21/23  Procedure visit Delano Metz, MD [facility]  Showing today's visits and meeting all other requirements Future Appointments Date Type Provider Dept  06/11/23 Appointment Delano Metz, MD [facility]  Showing future appointments within next 90 days and meeting all other requirements  Disposition: Discharge home  Discharge (Date  Time): 05/21/2023; 1235 hrs.   Primary Care Physician: Ollen Bowl, MD Location: [facility] Note by: Oswaldo Done, MD (TTS technology used. I apologize for any typographical errors that were not detected and corrected.) Date: 05/21/2023; Time: 12:24 PM  Disclaimer:  Medicine is not an Visual merchandiser. The only guarantee in medicine is that nothing is guaranteed. It is important to note that the decision to proceed with this intervention was based on the information collected from the patient. The Data and conclusions were drawn from the patient's questionnaire, the interview, and  the physical examination. Because the information was provided in large part by the patient, it cannot be guaranteed that it has not been purposely or unconsciously manipulated. Every effort has been made to obtain as much relevant data as possible for this evaluation. It is important to note that the conclusions that lead to this procedure are derived in large part from the available data. Always take into account that the treatment will also be dependent on availability of resources and existing treatment guidelines, considered by other Pain Management Practitioners as being common knowledge and practice, at the time of the intervention. For Medico-Legal purposes, it is also important to point out that variation in procedural techniques and pharmacological choices are the acceptable norm. The indications, contraindications, technique, and results of the above procedure should only be interpreted and judged by a Board-Certified  Interventional Pain Specialist with extensive familiarity and expertise in the same exact procedure and technique.

## 2023-05-21 ENCOUNTER — Ambulatory Visit: Admission: RE | Admit: 2023-05-21 | Payer: Medicare Other | Source: Ambulatory Visit

## 2023-05-21 ENCOUNTER — Ambulatory Visit: Payer: Medicare Other | Attending: Pain Medicine | Admitting: Pain Medicine

## 2023-05-21 ENCOUNTER — Encounter: Payer: Self-pay | Admitting: Pain Medicine

## 2023-05-21 VITALS — BP 125/67 | HR 103 | Temp 98.2°F | Resp 17 | Ht 65.0 in | Wt 282.0 lb

## 2023-05-21 DIAGNOSIS — M961 Postlaminectomy syndrome, not elsewhere classified: Secondary | ICD-10-CM | POA: Insufficient documentation

## 2023-05-21 DIAGNOSIS — M47816 Spondylosis without myelopathy or radiculopathy, lumbar region: Secondary | ICD-10-CM | POA: Diagnosis not present

## 2023-05-21 DIAGNOSIS — M545 Low back pain, unspecified: Secondary | ICD-10-CM | POA: Insufficient documentation

## 2023-05-21 DIAGNOSIS — M4325 Fusion of spine, thoracolumbar region: Secondary | ICD-10-CM | POA: Diagnosis not present

## 2023-05-21 DIAGNOSIS — Z6841 Body Mass Index (BMI) 40.0 and over, adult: Secondary | ICD-10-CM | POA: Insufficient documentation

## 2023-05-21 DIAGNOSIS — M5137 Other intervertebral disc degeneration, lumbosacral region: Secondary | ICD-10-CM | POA: Insufficient documentation

## 2023-05-21 DIAGNOSIS — M5459 Other low back pain: Secondary | ICD-10-CM | POA: Diagnosis not present

## 2023-05-21 DIAGNOSIS — M47817 Spondylosis without myelopathy or radiculopathy, lumbosacral region: Secondary | ICD-10-CM | POA: Diagnosis not present

## 2023-05-21 DIAGNOSIS — G8929 Other chronic pain: Secondary | ICD-10-CM | POA: Diagnosis not present

## 2023-05-21 DIAGNOSIS — T85848S Pain due to other internal prosthetic devices, implants and grafts, sequela: Secondary | ICD-10-CM | POA: Diagnosis not present

## 2023-05-21 MED ORDER — PENTAFLUOROPROP-TETRAFLUOROETH EX AERO
INHALATION_SPRAY | Freq: Once | CUTANEOUS | Status: AC
  Administered 2023-05-21: 30 via TOPICAL
  Filled 2023-05-21: qty 30

## 2023-05-21 MED ORDER — ROPIVACAINE HCL 2 MG/ML IJ SOLN
INTRAMUSCULAR | Status: AC
  Filled 2023-05-21: qty 20

## 2023-05-21 MED ORDER — LIDOCAINE HCL 2 % IJ SOLN
INTRAMUSCULAR | Status: AC
  Filled 2023-05-21: qty 20

## 2023-05-21 MED ORDER — TRIAMCINOLONE ACETONIDE 40 MG/ML IJ SUSP
80.0000 mg | Freq: Once | INTRAMUSCULAR | Status: AC
  Administered 2023-05-21: 80 mg

## 2023-05-21 MED ORDER — FENTANYL CITRATE (PF) 100 MCG/2ML IJ SOLN
25.0000 ug | INTRAMUSCULAR | Status: DC | PRN
  Administered 2023-05-21: 50 ug via INTRAVENOUS

## 2023-05-21 MED ORDER — MIDAZOLAM HCL 5 MG/5ML IJ SOLN
0.5000 mg | Freq: Once | INTRAMUSCULAR | Status: AC
  Administered 2023-05-21: 1 mg via INTRAVENOUS

## 2023-05-21 MED ORDER — FENTANYL CITRATE (PF) 100 MCG/2ML IJ SOLN
INTRAMUSCULAR | Status: AC
  Filled 2023-05-21: qty 2

## 2023-05-21 MED ORDER — LACTATED RINGERS IV SOLN: Freq: Once | INTRAVENOUS | Status: AC

## 2023-05-21 MED ORDER — ROPIVACAINE HCL 2 MG/ML IJ SOLN
18.0000 mL | Freq: Once | INTRAMUSCULAR | Status: AC
  Administered 2023-05-21: 18 mL via PERINEURAL

## 2023-05-21 MED ORDER — TRIAMCINOLONE ACETONIDE 40 MG/ML IJ SUSP
INTRAMUSCULAR | Status: AC
  Filled 2023-05-21: qty 1

## 2023-05-21 MED ORDER — LIDOCAINE HCL 2 % IJ SOLN
20.0000 mL | Freq: Once | INTRAMUSCULAR | Status: AC
  Administered 2023-05-21: 400 mg

## 2023-05-21 MED ORDER — MIDAZOLAM HCL 5 MG/5ML IJ SOLN
INTRAMUSCULAR | Status: AC
  Filled 2023-05-21: qty 5

## 2023-05-21 NOTE — Progress Notes (Signed)
Safety precautions to be maintained throughout the outpatient stay will include: orient to surroundings, keep bed in low position, maintain call bell within reach at all times, provide assistance with transfer out of bed and ambulation.  

## 2023-05-21 NOTE — Patient Instructions (Addendum)
____________________________________________________________________________________________  Post-Procedure Discharge Instructions  Instructions: Apply ice:  Purpose: This will minimize any swelling and discomfort after procedure.  When: Day of procedure, as soon as you get home. How: Fill a plastic sandwich bag with crushed ice. Cover it with a small towel and apply to injection site. How long: (15 min on, 15 min off) Apply for 15 minutes then remove x 15 minutes.  Repeat sequence on day of procedure, until you go to bed. Apply heat:  Purpose: To treat any soreness and discomfort from the procedure. When: Starting the next day after the procedure. How: Apply heat to procedure site starting the day following the procedure. How long: May continue to repeat daily, until discomfort goes away. Food intake: Start with clear liquids (like water) and advance to regular food, as tolerated.  Physical activities: Keep activities to a minimum for the first 8 hours after the procedure. After that, then as tolerated. Driving: If you have received any sedation, be responsible and do not drive. You are not allowed to drive for 24 hours after having sedation. Blood thinner: (Applies only to those taking blood thinners) You may restart your blood thinner 6 hours after your procedure. Insulin: (Applies only to Diabetic patients taking insulin) As soon as you can eat, you may resume your normal dosing schedule. Infection prevention: Keep procedure site clean and dry. Shower daily and clean area with soap and water. Post-procedure Pain Diary: Extremely important that this be done correctly and accurately. Recorded information will be used to determine the next step in treatment. For the purpose of accuracy, follow these rules: Evaluate only the area treated. Do not report or include pain from an untreated area. For the purpose of this evaluation, ignore all other areas of pain, except for the treated  area. After your procedure, avoid taking a long nap and attempting to complete the pain diary after you wake up. Instead, set your alarm clock to go off every hour, on the hour, for the initial 8 hours after the procedure. Document the duration of the numbing medicine, and the relief you are getting from it. Do not go to sleep and attempt to complete it later. It will not be accurate. If you received sedation, it is likely that you were given a medication that may cause amnesia. Because of this, completing the diary at a later time may cause the information to be inaccurate. This information is needed to plan your care. Follow-up appointment: Keep your post-procedure follow-up evaluation appointment after the procedure (usually 2 weeks for most procedures, 6 weeks for radiofrequencies). DO NOT FORGET to bring you pain diary with you.   Expect: (What should I expect to see with my procedure?) From numbing medicine (AKA: Local Anesthetics): Numbness or decrease in pain. You may also experience some weakness, which if present, could last for the duration of the local anesthetic. Onset: Full effect within 15 minutes of injected. Duration: It will depend on the type of local anesthetic used. On the average, 1 to 8 hours.  From steroids (Applies only if steroids were used): Decrease in swelling or inflammation. Once inflammation is improved, relief of the pain will follow. Onset of benefits: Depends on the amount of swelling present. The more swelling, the longer it will take for the benefits to be seen. In some cases, up to 10 days. Duration: Steroids will stay in the system x 2 weeks. Duration of benefits will depend on multiple posibilities including persistent irritating factors. Side-effects: If present, they  may typically last 2 weeks (the duration of the steroids). Frequent: Cramps (if they occur, drink Gatorade and take over-the-counter Magnesium 450-500 mg once to twice a day); water retention with  temporary weight gain; increases in blood sugar; decreased immune system response; increased appetite. Occasional: Facial flushing (red, warm cheeks); mood swings; menstrual changes. Uncommon: Long-term decrease or suppression of natural hormones; bone thinning. (These are more common with higher doses or more frequent use. This is why we prefer that our patients avoid having any injection therapies in other practices.)  Very Rare: Severe mood changes; psychosis; aseptic necrosis. From procedure: Some discomfort is to be expected once the numbing medicine wears off. This should be minimal if ice and heat are applied as instructed.  Call if: (When should I call?) You experience numbness and weakness that gets worse with time, as opposed to wearing off. New onset bowel or bladder incontinence. (Applies only to procedures done in the spine)  Emergency Numbers: Durning business hours (Monday - Thursday, 8:00 AM - 4:00 PM) (Friday, 9:00 AM - 12:00 Noon): [phone] After hours: [phone] NOTE: If you are having a problem and are unable connect with, or to talk to a provider, then go to your nearest urgent care or emergency department. If the problem is serious and urgent, please call 911. ____________________________________________________________________________________________   _________________________________________________________________________________________  Body mass index (BMI) Weight Management Required  URGENT: Dear Amy [name] your weight has been found to be adversely affecting your health. Aggressive action is immediately required. Talk to your primary care physician.  Body mass index (BMI) is a common tool for deciding whether a person has an appropriate body weight.  It measures a persons weight in relation to their height.   According to the [facility] of health (NIH): A BMI of less than 18.5 means that a person is underweight. A BMI of between  18.5 and 24.9 is ideal. A BMI of between 25 and 29.9 is overweight. A BMI over 30 indicates obesity.  Body Mass Index (BMI) Classification BMI level (kg/m2) Category Associated incidence of chronic pain  <18  Underweight   18.5-24.9 Ideal body weight   25-29.9 Overweight  20%  30-34.9 Obese (Class I)  68%  35-39.9 Severe obesity (Class II)  136%  >40 Extreme obesity (Class III)  254%   Your current Estimated body mass index is 46.93 kg/m as calculated from the following:   Height as of this encounter: 5\' 5"  (1.651 m).   Weight as of this encounter: 282 lb (127.9 kg).  Morbidly Obese Classification: You will be considered to be "Morbidly Obese" if your BMI is above 30 and you have one or more of the following conditions caused or associated to obesity: 1.    Type 2 Diabetes (Leading to cardiovascular diseases (CVD), stroke, peripheral vascular diseases (PVD), retinopathy, nephropathy, and neuropathy) 2.    Cardiovascular Disease (High Blood Pressure; Congestive Heart Failure; High Cholesterol; Coronary Artery Disease; Angina; Arrhythmias, Dysrhythmias, or Heart Attacks) 3.    Breathing problems (Asthma; obesity-hypoventilation syndrome; obstructive sleep apnea; chronic inflammatory airway disease; reactive airway disease; or shortness of breath) 4.    Chronic kidney disease 5.    Liver disease (nonalcoholic fatty liver disease) 6.    High blood pressure 7.    Acid reflux (gastroesophageal reflux disease; heartburn) 8.    Osteoarthritis (OA) (affecting the hip(s), the knee(s) and/or the lower back) 9.    Low back pain (Lumbar Facet Syndrome; and/or Degenerative Disc Disease) 10.  Hip pain (Osteoarthritis of hip) (For every 1 lbs of added body weight, there is a 2 lbs increase in pressure inside of each hip articulation. 1:2 mechanical relationship) 11.  Knee pain (Osteoarthritis of knee) (For every 1 lbs of added body weight, there is a 4 lbs increase in pressure inside of each knee  articulation. 1:4 mechanical relationship) (patients with a BMI>30 kg/m2 were 6.8 times more likely to develop knee OA than normal-weight individuals) 12.  Cancer: Epidemiological studies have shown that obesity is a risk factor for: post-menopausal breast cancer; cancers of the endometrium, colon and kidney cancer; malignant adenomas of the oesophagus. Obese subjects have an approximately 1.5-3.5-fold increased risk of developing these cancers compared with normal-weight subjects, and it has been estimated that between 15 and 45% of these cancers can be attributed to overweight. More recent studies suggest that obesity may also increase the risk of other types of cancer, including pancreatic, hepatic and gallbladder cancer. (Ref: Obesity and cancer. Pischon T, Nthlings U, [employer] H. Proc Nutr Soc. 2008 May;67(2):128-45. doi: 10.1017/S0029665108006976.) The International Agency for Research on Cancer (IARC) has identified 13 cancers associated with overweight and obesity: meningioma, multiple myeloma, adenocarcinoma of the esophagus, and cancers of the thyroid, postmenopausal breast cancer, gallbladder, stomach, liver, pancreas, kidney, ovaries, uterus, colon and rectal (colorectal) cancers. 55 percent of all cancers diagnosed in women and 24 percent of those diagnosed in men are associated with overweight and obesity.  Recommendation: At this point it is urgent that you take a step back and concentrate in loosing weight. Dedicate 100% of your efforts on this task. Nothing else will improve your health more than bringing your weight down and your BMI to less than 30. If you are here, you probably have chronic pain. We know that most chronic pain patients have difficulty exercising secondary to their pain. For this reason, you must rely on proper nutrition and diet in order to lose the weight. If your BMI is above 40, you should seriously consider bariatric surgery. A realistic goal is to lose 10% of your body  weight over a period of 12 months.  Be honest to yourself, if over time you have unsuccessfully tried to lose weight, then it is time for you to seek professional help and to enter a medically supervised weight management program, and/or undergo bariatric surgery. Stop procrastinating.   Pain management considerations:  1.    Pharmacological Problems: Be advised that the use of opioid analgesics (oxycodone; hydrocodone; morphine; methadone; codeine; and all of their derivatives) have been associated with decreased metabolism and weight gain.  For this reason, should we see that you are unable to lose weight while taking these medications, it may become necessary for [place] to taper down and indefinitely discontinue them.  2.    Technical Problems: The incidence of successful interventional therapies decreases as the patient's BMI increases. It is much more difficult to accomplish a safe and effective interventional therapy on a patient with a BMI above 35. 3.    Radiation Exposure Problems: The x-rays machine, used to accomplish injection therapies, will automatically increase their x-ray output in order to capture an appropriate bone image. This means that radiation exposure increases exponentially with the patient's BMI. (The higher the BMI, the higher the radiation exposure.) Although the level of radiation used at a given time is still safe to the patient, it is not for the physician and/or assisting staff. Unfortunately, radiation exposure is accumulative. Because physicians and the staff have to do  procedures and be exposed on a daily basis, this can result in health problems such as cancer and radiation burns. Radiation exposure to the staff is monitored by the radiation batches that they wear. The exposure levels are reported back to the staff on a quarterly basis. Depending on levels of exposure, physicians and staff may be obligated by law to decrease this exposure. This means that they have the right and  obligation to refuse providing therapies where they may be overexposed to radiation. For this reason, physicians may decline to offer therapies such as radiofrequency ablation or implants to patients with a BMI above 40. 4.    Current Trends: Be advised that the current trend is to no longer offer certain therapies to patients with a BMI equal to, or above 35, due to increase perioperative risks, increased technical procedural difficulties, and excessive radiation exposure to healthcare personnel.  _________________________________________________________________________________________

## 2023-05-22 ENCOUNTER — Telehealth: Payer: Self-pay | Admitting: Pain Medicine

## 2023-05-22 ENCOUNTER — Telehealth: Payer: Self-pay

## 2023-05-22 NOTE — Telephone Encounter (Signed)
Post procedure follow up.  LM 

## 2023-05-22 NOTE — Telephone Encounter (Signed)
PT called states sorry that she missed the called, however she is doing fine.

## 2023-05-27 DIAGNOSIS — R0789 Other chest pain: Secondary | ICD-10-CM | POA: Insufficient documentation

## 2023-05-30 ENCOUNTER — Ambulatory Visit: Payer: Medicare Other | Admitting: Pain Medicine

## 2023-05-31 ENCOUNTER — Ambulatory Visit: Payer: Medicare Other | Attending: Cardiology | Admitting: Cardiology

## 2023-05-31 ENCOUNTER — Encounter: Payer: Self-pay | Admitting: Cardiology

## 2023-05-31 VITALS — BP 124/60 | HR 89 | Ht 65.0 in | Wt 280.6 lb

## 2023-05-31 DIAGNOSIS — I2510 Atherosclerotic heart disease of native coronary artery without angina pectoris: Secondary | ICD-10-CM | POA: Diagnosis not present

## 2023-05-31 DIAGNOSIS — I10 Essential (primary) hypertension: Secondary | ICD-10-CM | POA: Insufficient documentation

## 2023-05-31 DIAGNOSIS — I5032 Chronic diastolic (congestive) heart failure: Secondary | ICD-10-CM | POA: Diagnosis present

## 2023-05-31 DIAGNOSIS — I471 Supraventricular tachycardia, unspecified: Secondary | ICD-10-CM | POA: Diagnosis not present

## 2023-05-31 MED ORDER — CLONIDINE HCL 0.1 MG PO TABS: 0.1000 mg | ORAL_TABLET | Freq: Two times a day (BID) | ORAL | 0 refills | Status: DC

## 2023-05-31 MED ORDER — SEMAGLUTIDE-WEIGHT MANAGEMENT 1.7 MG/0.75ML ~~LOC~~ SOAJ: 1.7000 mg | SUBCUTANEOUS | 0 refills | Status: DC

## 2023-05-31 MED ORDER — SEMAGLUTIDE-WEIGHT MANAGEMENT 2.4 MG/0.75ML ~~LOC~~ SOAJ
2.4000 mg | SUBCUTANEOUS | 0 refills | Status: DC
Start: 2023-09-24 — End: 2023-07-16

## 2023-05-31 MED ORDER — SEMAGLUTIDE-WEIGHT MANAGEMENT 1 MG/0.5ML ~~LOC~~ SOAJ
1.0000 mg | SUBCUTANEOUS | 0 refills | Status: DC
Start: 2023-07-28 — End: 2023-07-16

## 2023-05-31 MED ORDER — SEMAGLUTIDE-WEIGHT MANAGEMENT 0.5 MG/0.5ML ~~LOC~~ SOAJ
0.5000 mg | SUBCUTANEOUS | 0 refills | Status: DC
Start: 2023-06-29 — End: 2023-07-16

## 2023-05-31 MED ORDER — SEMAGLUTIDE-WEIGHT MANAGEMENT 0.25 MG/0.5ML ~~LOC~~ SOAJ: 0.2500 mg | SUBCUTANEOUS | 0 refills | Status: AC

## 2023-05-31 MED ORDER — CARVEDILOL 6.25 MG PO TABS: 6.2500 mg | ORAL_TABLET | Freq: Two times a day (BID) | ORAL | 3 refills | Status: DC

## 2023-05-31 NOTE — Patient Instructions (Addendum)
Medication Instructions:   DECREASE cloNIDine - Take one tablet ( 0.1mg ) by mouth twice a day.  INCREASE Carvedilol - Take one tablet ( 6.254mg ) by mouth twice a day. Start taking Wegovy  Month 1: 0.25 mg once a week. Month 2: 0.5 mg once a week. (Call or send [place] a MyChart message when you are on week 2, so we can send your next dose in for you).  Month 3: 1 mg once a week. Month 4: 1.7 mg once a week. Month 5 and beyond: 2.4 mg once a week (maintenance dose)   *If you need a refill on your cardiac medications before your next appointment, please call your pharmacy*   Lab Work:  None Ordered  If you have labs (blood work) drawn today and your tests are completely normal, you will receive your results only by: MyChart Message (if you have MyChart) OR A paper copy in the mail If you have any lab test that is abnormal or we need to change your treatment, we will call you to review the results.   Testing/Procedures:  None Ordered   Follow-Up: At [facility], you and your health needs are our priority.  As part of our continuing mission to provide you with exceptional heart care, we have created designated Provider Care Teams.  These Care Teams include your primary Cardiologist (physician) and Advanced Practice Providers (APPs -  Physician Assistants and Nurse Practitioners) who all work together to provide you with the care you need, when you need it.  We recommend signing up for the patient portal called "MyChart".  Sign up information is provided on this After Visit Summary.  MyChart is used to connect with patients for Virtual Visits (Telemedicine).  Patients are able to view lab/test results, encounter notes, upcoming appointments, etc.  Non-urgent messages can be sent to your provider as well.   To learn more about what you can do with MyChart, go to [email or web address].    Your next appointment:   6 week(s)  Provider:   Debbe Odea, MD ONLY

## 2023-05-31 NOTE — Progress Notes (Signed)
Cardiology Office Note:    Date:  05/31/2023   ID:  AAMYA KIENTZ, DOB September 04, 1946, MRN [id number]  PCP:  Ollen Bowl, MD   [facility]  Cardiologist:  Debbe Odea, MD  Advanced Practice Provider:  No care team member to display Electrophysiologist:  None       Referring MD: T[facility]*   Chief Complaint  Patient presents with   Follow-up    Patient is concerned with high blood pressure readings on home check.  Brought in home machine and reading is higher than manual reading (156/85).  Also having intermittent chest pain with more intense episode yesterday that lasted 2 hours.    History of Present Illness:    Amy [name] is a 77 y.o. 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  Patient states doing okay, occasional chest pain.  Tolerating current medications as prescribed, states being on too many meds.  Nifedipine previously increased.  Tolerating current dose of carvedilol.  Still has symptoms of fatigue.  BP at home in the 150s, blood pressure using patient's BP machine today confirms systolic in 150s.  Office blood pressure measurements were normal in the 120s.  Prior notes Cardiac monitor 6/24 1 episode of paroxysmal SVT. Coronary CTA 10/2022, mild LAD stenosis 25% Lexiscan Myoview 01/2021 no evidence for ischemia, low risk study. Echo 02/07/2021 normal systolic function, EF 60 to 65%, Echo 06/2016 showed normal systolic function, EF 60 to 65%.  Impaired relaxation.  Past Medical History:  Diagnosis Date   Abdominal pain of unknown etiology 02/05/2016   Anxiety    Arthritis    ddd- RA   Asthma    "sleeping asthma"   Chronic back pain    lumbar steroid injection's   Complication of anesthesia    Hard to wake up. Pt sts is took 3 days.   Constipation    Depression    Dysphonia     intermittent "voice changes"   Esophageal dysmotility    Fibromyalgia    Fibromyalgia    GERD (gastroesophageal reflux disease)    Glaucoma    Headache    High cholesterol    ? pt states her doctor told her to continue eating vegetables & fruit   History of DVT (deep vein thrombosis) 1989   LEFT UPPER ARM   History of hiatal hernia    History of MRSA infection 2011   Hypertension    Hypothyroidism    Irregular heart rate    "years ago"   Low iron    Lumbago    Mild obstructive sleep apnea    per study 02-07-2006 - no cpap   Neuropathy    feet   Pelvic pain in female    Peripheral vascular disease (HCC)    "poor circulation"   SBO (small bowel obstruction) (HCC) 06/2016   Seasonal allergies    Short of breath on exertion    Urinary frequency    Vaginal atrophy    Weakness    both hands and feet    Past Surgical History:  Procedure Laterality Date   abdominal adhesions removed     ABDOMINAL HYSTERECTOMY  1978   BACK SURGERY     BUNIONECTOMY Left 2008   CATARACT EXTRACTION Bilateral 2017   COLON SURGERY     COLONOSCOPY     CYSTO WITH HYDRODISTENSION N/A 07/12/2015  Procedure: CYSTOSCOPY/HYDRODISTENSION;  Surgeon: Alfredo Martinez, MD;  Location: [facility];  Service: Urology;  Laterality: N/A;   DILATION AND CURETTAGE OF UTERUS     ESOPHAGEAL MANOMETRY N/A 11/22/2014   Procedure: ESOPHAGEAL MANOMETRY (EM);  Surgeon: Vertell Novak., MD;  Location: [facility];  Service: Endoscopy;  Laterality: N/A;   ESOPHAGOGASTRODUODENOSCOPY N/A 07/15/2013   Procedure: ESOPHAGOGASTRODUODENOSCOPY (EGD);  Surgeon: Vertell Novak., MD;  Location: [facility];  Service: Endoscopy;  Laterality: N/A;  need xray   EXPLORATORY LAPAROTOMY     EYE SURGERY Bilateral    cataract surgery with lens implants   HARDWARE REMOVAL N/A 01/02/2016   Procedure: Exploration of Lumbar Fusion,Removal of hardware Lumbar One-Two ;Redo Posterior Lumbar Fusion Lumbar One-Two;   Surgeon: Donalee Citrin, MD;  Location: [facility];  Service: Neurosurgery;  Laterality: N/A;   ingrown toenail removal     LAPAROSCOPIC CHOLECYSTECTOMY  01-11-2006   LAPAROSCOPY N/A 07/06/2016   Procedure: LAPAROSCOPY DIAGNOSTIC EMERGENT TO OPEN;  Surgeon: Gaynelle Adu, MD;  Location: [facility];  Service: General;  Laterality: N/A;   LAPAROTOMY N/A 07/06/2016   Procedure: EXPLORATORY LAPAROTOMY LYSIS OF ADHESIONS FOR 3 HOURS;  Surgeon: Gaynelle Adu, MD;  Location: [facility] OR;  Service: General;  Laterality: N/A;   lipoma removal     LUMBAR FUSION  2014   L4 -- L5   MASS EXCISION Left 02/20/2013   Procedure: EXCISION LEFT BUTTOCK  MASS;  Surgeon: Ardeth Sportsman, MD;  Location: [facility];  Service: General;  Laterality: Left;   NOSE SURGERY  2007   REMOVAL HARDWARE L4-L5/  BILATERAL LAMINECTOMY L2 - L5 AND FUSION  06-12-2011   SHOULDER OPEN ROTATOR CUFF REPAIR Left 05/05/2014   Procedure: OPEN ACROMIONECTOMY AND OPEN REPAIR OF ROTATOR CUFF, TISSUEMEND GRAFT WITH ANCHOR ;  Surgeon: Jacki Cones, MD;  Location: [facility];  Service: Orthopedics;  Laterality: Left;   SPINE SURGERY     x6   TONSILLECTOMY      Current Medications: Current Meds  Medication Sig   acetaminophen (TYLENOL) 500 MG tablet Take 1,000 mg by mouth every 6 (six) hours as needed for moderate pain.    atorvastatin (LIPITOR) 20 MG tablet Take 1 tablet (20 mg total) by mouth daily.   B Complex Vitamins (B COMPLEX PO) Take 1 tablet by mouth daily as needed.   diphenhydrAMINE (BENADRYL) 25 MG tablet Take 25 mg by mouth 2 (two) times daily as needed for allergies.   estradiol (ESTRACE) 0.1 MG/GM vaginal cream Place 1 Applicatorful vaginally 2 (two) times a week.   fluticasone (FLONASE) 50 MCG/ACT nasal spray Place 1 spray into both nostrils daily.   furosemide (LASIX) 20 MG tablet Take 1 tablet (20 mg total) by mouth daily.   gabapentin (NEURONTIN) 600 MG tablet Take 600-1,200 mg by mouth 2 (two) times daily. Take 600mg  in the AM and 1,200mg   in the PM   hydrALAZINE (APRESOLINE) 100 MG tablet Take 1 tablet (100 mg total) by mouth 2 (two) times daily.   levothyroxine (SYNTHROID) 25 MCG tablet Take 25 mcg by mouth every morning.   losartan (COZAAR) 100 MG tablet Take 100 mg by mouth daily.   Menthol, Topical Analgesic, (ICY HOT BACK EX) Apply 1 application topically 2 (two) times daily as needed (pain).   methocarbamol (ROBAXIN) 500 MG tablet Take 500 mg by mouth 4 (four) times daily as needed for muscle spasms.   NIFEdipine (PROCARDIA XL/NIFEDICAL-XL) 90 MG 24 hr tablet Take 1 tablet (  90 mg total) by mouth daily.   pantoprazole (PROTONIX) 40 MG tablet Take 80 mg by mouth daily.    PARoxetine (PAXIL) 30 MG tablet Take 30 mg by mouth daily.   Potassium Chloride ER 20 MEQ TBCR Take 1 tablet by mouth daily.   Semaglutide-Weight Management 0.25 MG/0.5ML SOAJ Inject 0.25 mg into the skin once a week for 28 days.   [START ON 06/29/2023] Semaglutide-Weight Management 0.5 MG/0.5ML SOAJ Inject 0.5 mg into the skin once a week for 28 days.   [START ON 07/28/2023] Semaglutide-Weight Management 1 MG/0.5ML SOAJ Inject 1 mg into the skin once a week for 28 days.   [START ON 08/26/2023] Semaglutide-Weight Management 1.7 MG/0.75ML SOAJ Inject 1.7 mg into the skin once a week for 28 days.   [START ON 09/24/2023] Semaglutide-Weight Management 2.4 MG/0.75ML SOAJ Inject 2.4 mg into the skin once a week for 28 days.   vitamin E 180 MG (400 UNITS) capsule Take 400 Units by mouth daily as needed.   [DISCONTINUED] carvedilol (COREG) 3.125 MG tablet Take 1 tablet (3.125 mg total) by mouth 2 (two) times daily.   [DISCONTINUED] cloNIDine (CATAPRES) 0.1 MG tablet Take 1 tablet (0.1 mg total) by mouth 3 (three) times daily.     Allergies:   Latex, Crestor [rosuvastatin calcium], Penicillins, Tetanus toxoids, and Tomato   Social History   Socioeconomic History   Marital status: Married    Spouse name: Not on file   Number of children: 1   Years of education:  Not on file   Highest education level: Master's degree (e.g., MA, MS, MEng, MEd, MSW, MBA)  Occupational History   Not on file  Tobacco Use   Smoking status: Former    Current packs/day: 0.00    Average packs/day: 0.3 packs/day for 5.0 years (1.3 ttl pk-yrs)    Types: Cigarettes    Start date: 07/10/1964    Quit date: 07/10/1969    Years since quitting: 53.9   Smokeless tobacco: Never  Vaping Use   Vaping status: Never Used  Substance and Sexual Activity   Alcohol use: Not Currently    Comment: rare, social   Drug use: No   Sexual activity: Not on file  Other Topics Concern   Not on file  Social History Narrative   Lives at home with husband    Right handed   Caffeine: max 2 cups coffee per day but not does not drink daily.   Social Determinants of Health   Financial Resource Strain: Not on file  Food Insecurity: Not on file  Transportation Needs: Not on file  Physical Activity: Not on file  Stress: Not on file  Social Connections: Not on file     Family History: The patient's family history includes Aneurysm in her maternal grandmother; Cancer in her daughter, mother, and sister; Colon cancer in her father; Colon polyps in her father; Diabetes in her mother; Hypertension in her brother, brother, father, mother, and sister; Kidney disease in her brother and mother; Liver cancer in her brother and brother; Osteoarthritis in her mother; Stroke in her sister. There is no history of Migraines or Headache.  ROS:   Please see the history of present illness.     All other systems reviewed and are negative.  EKGs/Labs/Other Studies Reviewed:    The following studies were reviewed today:   EKG Interpretation Date/Time:  Friday May 31 2023 10:45:31 EDT Ventricular Rate:  89 PR Interval:  146 QRS Duration:  80 QT Interval:  350 QTC Calculation: 425 R Axis:   -35  Text Interpretation: Normal sinus rhythm Left axis deviation Confirmed by Debbe Odea [phone])  on 05/31/2023 10:54:38 AM    Recent Labs: 09/29/2022: ALT 16; Hemoglobin 13.5; Magnesium 2.0; Platelets 313 04/08/2023: BUN 23; Creatinine, Ser 1.17; Potassium 4.6; Sodium 139  Recent Lipid Panel    Component Value Date/Time   CHOL 165 07/03/2016 0627   TRIG 148 07/27/2016 0415   HDL 82 07/03/2016 0627   CHOLHDL 2.0 07/03/2016 0627   VLDL 8 07/03/2016 0627   LDLCALC 75 07/03/2016 0627     Risk Assessment/Calculations:      Physical Exam:    VS:  BP 124/60 (BP Location: Left Arm, Patient Position: Sitting, Cuff Size: Large)   Pulse 89   Ht 5\' 5"  (1.651 m)   Wt 280 lb 9.6 oz (127.3 kg)   SpO2 94%   BMI 46.69 kg/m     Wt Readings from Last 3 Encounters:  05/31/23 280 lb 9.6 oz (127.3 kg)  05/21/23 282 lb (127.9 kg)  04/23/23 282 lb (127.9 kg)     GEN:  Well nourished, well developed in no acute distress HEENT: Normal NECK: No JVD; No carotid bruits CARDIAC: RRR, no murmurs, rubs, gallops RESPIRATORY:  Clear to auscultation without rales, wheezing or rhonchi  ABDOMEN: Soft, non-tender, non-distended MUSCULOSKELETAL:  trace edema; No deformity  SKIN: Warm and dry NEUROLOGIC:  Alert and oriented x 3 PSYCHIATRIC:  Normal affect   ASSESSMENT:    1. Primary hypertension   2. Paroxysmal SVT (supraventricular tachycardia)   3. Coronary artery disease involving native coronary artery of native heart, unspecified whether angina present   4. Chronic heart failure with preserved ejection fraction (HCC)   5. Morbid obesity (HCC)    PLAN:    In order of problems listed above:  Hypertension, BP controlled.  Continue nifedipine 90 mg daily, reduce clonidine to 0.1 mg twice daily, increase Coreg to 6.25 mg mg twice daily.continue hydralazine.  Plan to titrate off clonidine further at follow-up visit..   Palpitations, occasional paroxysmal SVT.  Continue Coreg 6.25 mg twice daily as above Mild Nonobstructive CAD, ( proximal LAD stenosis 25%).  Denies chest pain. LDL at goal,  continue Lipitor 20 mg daily.Marland Kitchen HFpEF, morbid obesity.  Continue Lasix 20 mg daily.  Morbid obesity, start LKGMWN.  Low-calorie diet, weight loss advised.  Patient has HFpEF, CAD, CKD.  Follow-up in 6 weeks.   Medication Adjustments/Labs and Tests Ordered: Current medicines are reviewed at length with the patient today.  Concerns regarding medicines are outlined above.  Orders Placed This Encounter  Procedures   EKG 12-Lead    Meds ordered this encounter  Medications   cloNIDine (CATAPRES) 0.1 MG tablet    Sig: Take 1 tablet (0.1 mg total) by mouth 2 (two) times daily.    Dispense:  180 tablet    Refill:  0   carvedilol (COREG) 6.25 MG tablet    Sig: Take 1 tablet (6.25 mg total) by mouth 2 (two) times daily.    Dispense:  180 tablet    Refill:  3   Semaglutide-Weight Management 0.25 MG/0.5ML SOAJ    Sig: Inject 0.25 mg into the skin once a week for 28 days.    Dispense:  2 mL    Refill:  0   Semaglutide-Weight Management 0.5 MG/0.5ML SOAJ    Sig: Inject 0.5 mg into the skin once a week for 28 days.    Dispense:  2 mL  Refill:  0   Semaglutide-Weight Management 1 MG/0.5ML SOAJ    Sig: Inject 1 mg into the skin once a week for 28 days.    Dispense:  2 mL    Refill:  0   Semaglutide-Weight Management 1.7 MG/0.75ML SOAJ    Sig: Inject 1.7 mg into the skin once a week for 28 days.    Dispense:  3 mL    Refill:  0   Semaglutide-Weight Management 2.4 MG/0.75ML SOAJ    Sig: Inject 2.4 mg into the skin once a week for 28 days.    Dispense:  3 mL    Refill:  0     Patient Instructions  Medication Instructions:   DECREASE cloNIDine - Take one tablet ( 0.1mg ) by mouth twice a day.  INCREASE Carvedilol - Take one tablet ( 6.254mg ) by mouth twice a day. Start taking Wegovy  Month 1: 0.25 mg once a week. Month 2: 0.5 mg once a week. (Call or send [place] a MyChart message when you are on week 2, so we can send your next dose in for you).  Month 3: 1 mg once a week. Month 4:  1.7 mg once a week. Month 5 and beyond: 2.4 mg once a week (maintenance dose)   *If you need a refill on your cardiac medications before your next appointment, please call your pharmacy*   Lab Work:  None Ordered  If you have labs (blood work) drawn today and your tests are completely normal, you will receive your results only by: MyChart Message (if you have MyChart) OR A paper copy in the mail If you have any lab test that is abnormal or we need to change your treatment, we will call you to review the results.   Testing/Procedures:  None Ordered   Follow-Up: At [facility], you and your health needs are our priority.  As part of our continuing mission to provide you with exceptional heart care, we have created designated Provider Care Teams.  These Care Teams include your primary Cardiologist (physician) and Advanced Practice Providers (APPs -  Physician Assistants and Nurse Practitioners) who all work together to provide you with the care you need, when you need it.  We recommend signing up for the patient portal called "MyChart".  Sign up information is provided on this After Visit Summary.  MyChart is used to connect with patients for Virtual Visits (Telemedicine).  Patients are able to view lab/test results, encounter notes, upcoming appointments, etc.  Non-urgent messages can be sent to your provider as well.   To learn more about what you can do with MyChart, go to [email or web address].    Your next appointment:   6 week(s)  Provider:   Debbe Odea, MD ONLY       Signed, Debbe Odea, MD  05/31/2023 11:44 AM    [facility]

## 2023-06-03 ENCOUNTER — Telehealth: Payer: Self-pay

## 2023-06-03 NOTE — Telephone Encounter (Signed)
Pharmacy Patient Advocate Encounter   Received notification from Fax that prior authorization for [facility] is required/requested.   Insurance verification completed.   The patient is insured through [employer] .   Per test claim: PA required; PA submitted to [employer] via Fax Key/confirmation #[id number] FAX Status is pending

## 2023-06-06 ENCOUNTER — Encounter: Payer: Self-pay | Admitting: Student in an Organized Health Care Education/Training Program

## 2023-06-06 ENCOUNTER — Other Ambulatory Visit (HOSPITAL_COMMUNITY): Payer: Self-pay

## 2023-06-06 ENCOUNTER — Ambulatory Visit (INDEPENDENT_AMBULATORY_CARE_PROVIDER_SITE_OTHER): Payer: Medicare Other | Admitting: Student in an Organized Health Care Education/Training Program

## 2023-06-06 VITALS — BP 150/82 | HR 88 | Temp 97.8°F | Ht 68.0 in | Wt 281.0 lb

## 2023-06-06 DIAGNOSIS — R911 Solitary pulmonary nodule: Secondary | ICD-10-CM

## 2023-06-06 NOTE — Telephone Encounter (Signed)
Pharmacy Patient Advocate Encounter  Received notification from [employer] that Prior Authorization for [facility] has been DENIED.  Full denial letter will be uploaded to the media tab. See denial reason below.  CAD and Obesity are not recognized indications for approval per plan.

## 2023-06-06 NOTE — Progress Notes (Signed)
Synopsis: Referred in for pulmonary nodules by Amy Bowl, MD  Assessment & Plan:   #Pulmonary Nodule  Presenting for the evaluation of a pulmonary nodule noted on a coronary CT from February of 2024. I have reviewed her previous imaging studies, including her thoracic spine CT's, a PET/CT from 2022, and an abdominal CT from 2022. She does have findings of chronic scarring in the LLL, as well as a stable appearing ground glass nodule in the anterior RLL. The nodule noted in the RLL is new when compared to these previous imaging studies. Repeat Imaging in August of 2024 shows the nodule to be stable. Given this, I will continue with radiographic surveillance for said nodule, with repeat CT to be performed in 2 years (04/2025). I will see the patient in 1 year at which point we will order the repeat scan.    Return in about 1 year (around 06/05/2024) for mychart visit.  I spent 25 minutes caring for this patient today, including preparing to see the patient, obtaining a medical history , reviewing a separately obtained history, performing a medically appropriate examination and/or evaluation, counseling and educating the patient/family/caregiver, ordering medications, tests, or procedures, documenting clinical information in the electronic health record, and independently interpreting results (not separately reported/billed) and communicating results to the patient/family/caregiver  Amy Chute, MD [facility] 06/06/2023 8:21 PM    End of visit medications:  No orders of the defined types were placed in this encounter.    Current Outpatient Medications:    acetaminophen (TYLENOL) 500 MG tablet, Take 1,000 mg by mouth every 6 (six) hours as needed for moderate pain. , Disp: , Rfl:    atorvastatin (LIPITOR) 20 MG tablet, Take 1 tablet (20 mg total) by mouth daily., Disp: 30 tablet, Rfl: 3   B Complex Vitamins (B COMPLEX PO), Take 1 tablet by mouth daily as needed.,  Disp: , Rfl:    carvedilol (COREG) 6.25 MG tablet, Take 1 tablet (6.25 mg total) by mouth 2 (two) times daily., Disp: 180 tablet, Rfl: 3   cloNIDine (CATAPRES) 0.1 MG tablet, Take 1 tablet (0.1 mg total) by mouth 2 (two) times daily., Disp: 180 tablet, Rfl: 0   diphenhydrAMINE (BENADRYL) 25 MG tablet, Take 25 mg by mouth 2 (two) times daily as needed for allergies., Disp: , Rfl:    estradiol (ESTRACE) 0.1 MG/GM vaginal cream, Place 1 Applicatorful vaginally 2 (two) times a week., Disp: 42.5 g, Rfl: 12   fluticasone (FLONASE) 50 MCG/ACT nasal spray, Place 1 spray into both nostrils daily., Disp: , Rfl: 2   furosemide (LASIX) 20 MG tablet, Take 1 tablet (20 mg total) by mouth daily., Disp: 90 tablet, Rfl: 0   gabapentin (NEURONTIN) 600 MG tablet, Take 600-1,200 mg by mouth 2 (two) times daily. Take 600mg  in the AM and 1,200mg  in the PM, Disp: , Rfl:    hydrALAZINE (APRESOLINE) 100 MG tablet, Take 1 tablet (100 mg total) by mouth 2 (two) times daily., Disp: 60 tablet, Rfl: 1   levothyroxine (SYNTHROID) 25 MCG tablet, Take 25 mcg by mouth every morning., Disp: , Rfl:    losartan (COZAAR) 100 MG tablet, Take 100 mg by mouth daily., Disp: , Rfl:    Menthol, Topical Analgesic, (ICY HOT BACK EX), Apply 1 application topically 2 (two) times daily as needed (pain)., Disp: , Rfl:    methocarbamol (ROBAXIN) 500 MG tablet, Take 500 mg by mouth 4 (four) times daily as needed for muscle spasms., Disp: ,  Rfl:    NIFEdipine (PROCARDIA XL/NIFEDICAL-XL) 90 MG 24 hr tablet, Take 1 tablet (90 mg total) by mouth daily., Disp: 90 tablet, Rfl: 3   OVER THE COUNTER MEDICATION, SOURSOP BITTERS liquid daily, Disp: , Rfl:    pantoprazole (PROTONIX) 40 MG tablet, Take 80 mg by mouth daily. , Disp: , Rfl: 0   PARoxetine (PAXIL) 30 MG tablet, Take 30 mg by mouth daily., Disp: , Rfl:    Potassium Chloride ER 20 MEQ TBCR, Take 1 tablet by mouth daily., Disp: , Rfl:    Semaglutide-Weight Management 0.25 MG/0.5ML SOAJ, Inject 0.25  mg into the skin once a week for 28 days., Disp: 2 mL, Rfl: 0   [START ON 06/29/2023] Semaglutide-Weight Management 0.5 MG/0.5ML SOAJ, Inject 0.5 mg into the skin once a week for 28 days., Disp: 2 mL, Rfl: 0   [START ON 07/28/2023] Semaglutide-Weight Management 1 MG/0.5ML SOAJ, Inject 1 mg into the skin once a week for 28 days., Disp: 2 mL, Rfl: 0   [START ON 08/26/2023] Semaglutide-Weight Management 1.7 MG/0.75ML SOAJ, Inject 1.7 mg into the skin once a week for 28 days., Disp: 3 mL, Rfl: 0   [START ON 09/24/2023] Semaglutide-Weight Management 2.4 MG/0.75ML SOAJ, Inject 2.4 mg into the skin once a week for 28 days., Disp: 3 mL, Rfl: 0   vitamin E 180 MG (400 UNITS) capsule, Take 400 Units by mouth daily as needed., Disp: , Rfl:    Subjective:   PATIENT ID: Amy [name] GENDER: female DOB: Feb 16, 1946, MRN: [id number]  Chief Complaint  Patient presents with   Follow-up    Patient reports SOB and occ wheezing.     HPI   [name] is a pleasant 77 year old female with a past medical history of HFpEF, HTN, HLD, OSA and obesity who presents to clinic for follow up re pulmonary nodules.   The patient was in her usual state of health and has followed with her primary care providers as well as her cardiologist. She was seen by her cardiologist for the complaint of chest pain for which a coronary CT was obtained. The CT was noted to have a RLL nodule for which she was referred to pulmonology. During our previous visit, we ordered a CT scan of the chest to better evaluate her nodules. She is here to discuss the results.   Patient does not report any change in respiratory symptoms. She feels fatigued with activity and long walks, and attributes her limitation to her aches as well as obesity. She does not have any increased cough, sputum production, wheezing, chest tightness, or other respiratory symptoms. She tells me that she was told in the past that she had a nodule in her lung that  was followed by her doctors.   She is a former Runner, broadcasting/film/video, and worked in IT consultant. She smoked briefly in the 70's, with 2 pack years of smoking history. She otherwise denies any exposures.    Chest CT 05/01/2023  1. Right lower lobe ground-glass nodules as above. The largest nodule is unchanged since 2019 consistent with benign etiology. The smaller nodule is stable since 11/12/2022, and repeat CT is recommended every 2 years until 5 years of stability has been established.   Ancillary information including prior medications, full medical/surgical/family/social histories, and PFTs (when available) are listed below and have been reviewed.   Review of Systems  Constitutional:  Negative for chills, fever, malaise/fatigue and weight loss.  Respiratory:  Negative for cough, hemoptysis, sputum production and  shortness of breath.   Cardiovascular:  Negative for chest pain.  Skin:  Negative for rash.     Objective:   Vitals:   06/06/23 1513  BP: (!) 150/82  Pulse: 88  Temp: 97.8 F (36.6 C)  TempSrc: Temporal  SpO2: 98%  Weight: 281 lb (127.5 kg)  Height: 5\' 8"  (1.727 m)   98% on RA BMI Readings from Last 3 Encounters:  06/06/23 42.73 kg/m  05/31/23 46.69 kg/m  05/21/23 46.93 kg/m   Wt Readings from Last 3 Encounters:  06/06/23 281 lb (127.5 kg)  05/31/23 280 lb 9.6 oz (127.3 kg)  05/21/23 282 lb (127.9 kg)    Physical Exam Constitutional:      Appearance: She is obese. She is not ill-appearing.  HENT:     Head: Normocephalic.     Mouth/Throat:     Mouth: Mucous membranes are moist.  Cardiovascular:     Rate and Rhythm: Normal rate and regular rhythm.     Pulses: Normal pulses.     Heart sounds: Normal heart sounds.  Pulmonary:     Effort: Pulmonary effort is normal.     Breath sounds: Normal breath sounds. No wheezing or rales.  Neurological:     General: No focal deficit present.     Mental Status: She is alert.       Ancillary Information     Past Medical History:  Diagnosis Date   Abdominal pain of unknown etiology 02/05/2016   Anxiety    Arthritis    ddd- RA   Asthma    "sleeping asthma"   Chronic back pain    lumbar steroid injection's   Complication of anesthesia    Hard to wake up. Pt sts is took 3 days.   Constipation    Depression    Dysphonia    intermittent "voice changes"   Esophageal dysmotility    Fibromyalgia    Fibromyalgia    GERD (gastroesophageal reflux disease)    Glaucoma    Headache    High cholesterol    ? pt states her doctor told her to continue eating vegetables & fruit   History of DVT (deep vein thrombosis) 1989   LEFT UPPER ARM   History of hiatal hernia    History of MRSA infection 2011   Hypertension    Hypothyroidism    Irregular heart rate    "years ago"   Low iron    Lumbago    Mild obstructive sleep apnea    per study 02-07-2006 - no cpap   Neuropathy    feet   Pelvic pain in female    Peripheral vascular disease (HCC)    "poor circulation"   SBO (small bowel obstruction) (HCC) 06/2016   Seasonal allergies    Short of breath on exertion    Urinary frequency    Vaginal atrophy    Weakness    both hands and feet     Family History  Problem Relation Age of Onset   Hypertension Mother    Cancer Mother        cervix   Kidney disease Mother    Osteoarthritis Mother    Diabetes Mother    Hypertension Father    Colon cancer Father    Colon polyps Father    Hypertension Sister    Cancer Sister        polyps   Stroke Sister    Kidney disease Brother    Hypertension Brother    Liver  cancer Brother    Hypertension Brother    Liver cancer Brother    Aneurysm Maternal Grandmother    Cancer Daughter        leukemia   Migraines Neg Hx    Headache Neg Hx      Past Surgical History:  Procedure Laterality Date   abdominal adhesions removed     ABDOMINAL HYSTERECTOMY  1978   BACK SURGERY     BUNIONECTOMY Left 2008   CATARACT EXTRACTION Bilateral 2017    COLON SURGERY     COLONOSCOPY     CYSTO WITH HYDRODISTENSION N/A 07/12/2015   Procedure: CYSTOSCOPY/HYDRODISTENSION;  Surgeon: Alfredo Martinez, MD;  Location: [facility];  Service: Urology;  Laterality: N/A;   DILATION AND CURETTAGE OF UTERUS     ESOPHAGEAL MANOMETRY N/A 11/22/2014   Procedure: ESOPHAGEAL MANOMETRY (EM);  Surgeon: Vertell Novak., MD;  Location: [facility];  Service: Endoscopy;  Laterality: N/A;   ESOPHAGOGASTRODUODENOSCOPY N/A 07/15/2013   Procedure: ESOPHAGOGASTRODUODENOSCOPY (EGD);  Surgeon: Vertell Novak., MD;  Location: [facility];  Service: Endoscopy;  Laterality: N/A;  need xray   EXPLORATORY LAPAROTOMY     EYE SURGERY Bilateral    cataract surgery with lens implants   HARDWARE REMOVAL N/A 01/02/2016   Procedure: Exploration of Lumbar Fusion,Removal of hardware Lumbar One-Two ;Redo Posterior Lumbar Fusion Lumbar One-Two;  Surgeon: Donalee Citrin, MD;  Location: [facility];  Service: Neurosurgery;  Laterality: N/A;   ingrown toenail removal     LAPAROSCOPIC CHOLECYSTECTOMY  01-11-2006   LAPAROSCOPY N/A 07/06/2016   Procedure: LAPAROSCOPY DIAGNOSTIC EMERGENT TO OPEN;  Surgeon: Gaynelle Adu, MD;  Location: [facility];  Service: General;  Laterality: N/A;   LAPAROTOMY N/A 07/06/2016   Procedure: EXPLORATORY LAPAROTOMY LYSIS OF ADHESIONS FOR 3 HOURS;  Surgeon: Gaynelle Adu, MD;  Location: [facility];  Service: General;  Laterality: N/A;   lipoma removal     LUMBAR FUSION  2014   L4 -- L5   MASS EXCISION Left 02/20/2013   Procedure: EXCISION LEFT BUTTOCK  MASS;  Surgeon: Ardeth Sportsman, MD;  Location: [facility];  Service: General;  Laterality: Left;   NOSE SURGERY  2007   REMOVAL HARDWARE L4-L5/  BILATERAL LAMINECTOMY L2 - L5 AND FUSION  06-12-2011   SHOULDER OPEN ROTATOR CUFF REPAIR Left 05/05/2014   Procedure: OPEN ACROMIONECTOMY AND OPEN REPAIR OF ROTATOR CUFF, TISSUEMEND GRAFT WITH ANCHOR ;  Surgeon: Jacki Cones, MD;  Location: [facility];  Service:  Orthopedics;  Laterality: Left;   SPINE SURGERY     x6   TONSILLECTOMY      Social History   Socioeconomic History   Marital status: Married    Spouse name: Not on file   Number of children: 1   Years of education: Not on file   Highest education level: Master's degree (e.g., MA, MS, MEng, MEd, MSW, MBA)  Occupational History   Not on file  Tobacco Use   Smoking status: Former    Current packs/day: 0.00    Average packs/day: 0.3 packs/day for 5.0 years (1.3 ttl pk-yrs)    Types: Cigarettes    Start date: 07/10/1964    Quit date: 07/10/1969    Years since quitting: 53.9   Smokeless tobacco: Never  Vaping Use   Vaping status: Never Used  Substance and Sexual Activity   Alcohol use: Not Currently    Comment: rare, social   Drug use: No   Sexual activity: Not on file  Other Topics Concern   Not on file  Social History Narrative   Lives at home with husband    Right handed   Caffeine: max 2 cups coffee per day but not does not drink daily.   Social Determinants of Health   Financial Resource Strain: Not on file  Food Insecurity: Not on file  Transportation Needs: Not on file  Physical Activity: Not on file  Stress: Not on file  Social Connections: Not on file  Intimate Partner Violence: Not on file     Allergies  Allergen Reactions   Latex Other (See Comments)    Sores, blisters - reaction to gloves   Crestor [Rosuvastatin Calcium]     Myalgias   Penicillins Other (See Comments)    UNSPECIFIED REACTION  "Has taken amoxicillin and ampicillin with no reaction" Has patient had a PCN reaction causing immediate rash, facial/tongue/throat swelling, SOB or lightheadedness with hypotension: Unknown Has patient had a PCN reaction causing severe rash involving mucus membranes or skin necrosis: Unknown Has patient had a PCN reaction that required hospitalization: Unknown Has patient had a PCN reaction occurring within the last 10 years: No    Tetanus Toxoids Rash    Tomato Rash     CBC    Component Value Date/Time   WBC 4.9 09/29/2022 1745   RBC 4.36 09/29/2022 1745   HGB 13.5 09/29/2022 1745   HGB 13.9 07/28/2020 1426   HCT 40.0 09/29/2022 1745   HCT 41.8 07/28/2020 1426   PLT 313 09/29/2022 1745   PLT 325 07/28/2020 1426   MCV 91.7 09/29/2022 1745   MCV 91 07/28/2020 1426   MCH 31.0 09/29/2022 1745   MCHC 33.8 09/29/2022 1745   RDW 14.5 09/29/2022 1745   RDW 13.9 07/28/2020 1426   LYMPHSABS 1.7 09/29/2022 1745   LYMPHSABS 1.2 07/28/2020 1426   MONOABS 0.6 09/29/2022 1745   EOSABS 0.1 09/29/2022 1745   EOSABS 0.1 07/28/2020 1426   BASOSABS 0.0 09/29/2022 1745   BASOSABS 0.0 07/28/2020 1426    Pulmonary Functions Testing Results:     No data to display          Outpatient Medications Prior to Visit  Medication Sig Dispense Refill   acetaminophen (TYLENOL) 500 MG tablet Take 1,000 mg by mouth every 6 (six) hours as needed for moderate pain.      atorvastatin (LIPITOR) 20 MG tablet Take 1 tablet (20 mg total) by mouth daily. 30 tablet 3   B Complex Vitamins (B COMPLEX PO) Take 1 tablet by mouth daily as needed.     carvedilol (COREG) 6.25 MG tablet Take 1 tablet (6.25 mg total) by mouth 2 (two) times daily. 180 tablet 3   cloNIDine (CATAPRES) 0.1 MG tablet Take 1 tablet (0.1 mg total) by mouth 2 (two) times daily. 180 tablet 0   diphenhydrAMINE (BENADRYL) 25 MG tablet Take 25 mg by mouth 2 (two) times daily as needed for allergies.     estradiol (ESTRACE) 0.1 MG/GM vaginal cream Place 1 Applicatorful vaginally 2 (two) times a week. 42.5 g 12   fluticasone (FLONASE) 50 MCG/ACT nasal spray Place 1 spray into both nostrils daily.  2   furosemide (LASIX) 20 MG tablet Take 1 tablet (20 mg total) by mouth daily. 90 tablet 0   gabapentin (NEURONTIN) 600 MG tablet Take 600-1,200 mg by mouth 2 (two) times daily. Take 600mg  in the AM and 1,200mg  in the PM     hydrALAZINE (APRESOLINE) 100 MG tablet Take 1  tablet (100 mg total) by mouth 2  (two) times daily. 60 tablet 1   levothyroxine (SYNTHROID) 25 MCG tablet Take 25 mcg by mouth every morning.     losartan (COZAAR) 100 MG tablet Take 100 mg by mouth daily.     Menthol, Topical Analgesic, (ICY HOT BACK EX) Apply 1 application topically 2 (two) times daily as needed (pain).     methocarbamol (ROBAXIN) 500 MG tablet Take 500 mg by mouth 4 (four) times daily as needed for muscle spasms.     NIFEdipine (PROCARDIA XL/NIFEDICAL-XL) 90 MG 24 hr tablet Take 1 tablet (90 mg total) by mouth daily. 90 tablet 3   OVER THE COUNTER MEDICATION SOURSOP BITTERS liquid daily     pantoprazole (PROTONIX) 40 MG tablet Take 80 mg by mouth daily.   0   PARoxetine (PAXIL) 30 MG tablet Take 30 mg by mouth daily.     Potassium Chloride ER 20 MEQ TBCR Take 1 tablet by mouth daily.     Semaglutide-Weight Management 0.25 MG/0.5ML SOAJ Inject 0.25 mg into the skin once a week for 28 days. 2 mL 0   [START ON 06/29/2023] Semaglutide-Weight Management 0.5 MG/0.5ML SOAJ Inject 0.5 mg into the skin once a week for 28 days. 2 mL 0   [START ON 07/28/2023] Semaglutide-Weight Management 1 MG/0.5ML SOAJ Inject 1 mg into the skin once a week for 28 days. 2 mL 0   [START ON 08/26/2023] Semaglutide-Weight Management 1.7 MG/0.75ML SOAJ Inject 1.7 mg into the skin once a week for 28 days. 3 mL 0   [START ON 09/24/2023] Semaglutide-Weight Management 2.4 MG/0.75ML SOAJ Inject 2.4 mg into the skin once a week for 28 days. 3 mL 0   vitamin E 180 MG (400 UNITS) capsule Take 400 Units by mouth daily as needed.     No facility-administered medications prior to visit.

## 2023-06-07 NOTE — Telephone Encounter (Signed)
Wegovy was denied

## 2023-06-10 NOTE — Progress Notes (Unsigned)
PROVIDER NOTE: Information contained herein reflects review and annotations entered in association with encounter. Interpretation of such information and data should be left to medically-trained personnel. Information provided to patient can be located elsewhere in the medical record under "Patient Instructions". Document created using STT-dictation technology, any transcriptional errors that may result from process are unintentional.    Patient: Amy [name]  Service Category: E/M  Provider: Oswaldo Done, MD  DOB: 07/11/1946  DOS: 06/11/2023  Referring Provider: Ollen Bowl, MD  MRN: [id number]  Specialty: Interventional Pain Management  PCP: Ollen Bowl, MD  Type: Established Patient  Setting: Ambulatory outpatient    Location: Office  Delivery: Face-to-face     HPI  [name], a 77 y.o. year old female, is here today because of her Chronic bilateral low back pain without sciatica [M54.50, G89.29]. [name] primary complain today is No chief complaint on file.  Pertinent problems: 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); Chronic neck pain (Right); Lumbar spondylosis; Lumbar facet syndrome; Lumbar facet hypertrophy; Epidural fibrosis; Epidural lipomatosis; Neurogenic pain; Occipital headaches; Failed back surgical syndrome (6); History of lumbar spinal fusion; H/O nonunion of fracture; Trochanteric bursitis; Lumbar post-laminectomy  syndrome; Sacroiliac joint disease (Bilateral); Secondary malignant neoplasm of vertebral column (HCC); Thoracic radiculopathy open (T5/6) (Bilateral); Arthropathy; Displacement of lumbar intervertebral disc without myelopathy; DDD (degenerative disc disease), lumbosacral; Chronic thoracic back pain (2ry area of Pain) (Bilateral) (R>L); Thoracic foraminal stenosis (Bilateral); Abnormal CT of thoracic spine (06/25/2022); Abnormal MRI, thoracic spine (10/24/2021); Abnormal CT scan, lumbar spine (09/01/2021); Abnormal MRI, lumbar spine (04/13/2021); Chronic sacroiliitis (HCC) (Bilateral); Fusion of spine, thoracolumbar region (T10-L3); Chronic sacroiliac joint pain (Bilateral); Chronic upper back pain; Thoracic spine pain; Thoracic facet joint pain; Thoracic facet syndrome; Spondylosis without myelopathy or radiculopathy, thoracic region; Chronic hip pain (Left); Postlaminectomy syndrome, thoracic; History of fusion of thoracic spine; Lumbar facet joint pain; Spondylosis without myelopathy or radiculopathy, lumbosacral region; and Pain from implanted hardware (lumbar fusion) on their pertinent problem list. Pain Assessment: Severity of   is reported as a  /10. Location:    / . Onset:  . Quality:  . Timing:  . Modifying factor(s):  Marland Kitchen Vitals:  vitals were not taken for this visit.  BMI: Estimated body mass index is 42.73 kg/m as calculated from the following:   Height as of 06/06/23: 5\' 8"  (1.727 m).   Weight as of 06/06/23: 281 lb (127.5 kg). Last encounter: 04/23/2023. Last procedure: 05/21/2023.  Reason for encounter: post-procedure evaluation and assessment. ***  Post-procedure evaluation   Procedure: Lumbar Facet, Medial Branch Block(s) #1  Laterality: Bilateral  Level: L2, L3, L4, L5, and S1 Medial Branch Level(s). Injecting these levels blocks the L3-4, L4-5, and L5-S1 lumbar facet joints. Imaging: Fluoroscopic guidance         Anesthesia: Local anesthesia (1-2% Lidocaine) Anxiolysis: IV Versed          Sedation: Moderate Sedation                       DOS: 05/21/2023 Performed by: Para March  Erlinda Hong, MD  Primary Purpose: Diagnostic/Therapeutic Indications: Low back pain severe enough to impact quality of life or function. 1. Chronic low back pain (1ry area of Pain) (Bilateral) (R>L) w/o sciatica   2. Lumbar facet joint pain   3. Lumbar facet syndrome   4. Spondylosis without myelopathy or radiculopathy, lumbosacral region   5. Lumbar facet hypertrophy   6. Failed back surgical syndrome (6)   7. Fusion of spine, thoracolumbar region (T10-L3)   8. DDD (degenerative disc disease), lumbosacral   9. Morbid obesity with body mass index of 45.0-49.9 in adult [facility])    NAS-11 Pain score:   Pre-procedure: 9 /10   Post-procedure: 0-No pain/10      Effectiveness:  Initial hour after procedure:   ***. Subsequent 4-6 hours post-procedure:   ***. Analgesia past initial 6 hours:   ***. Ongoing improvement:  Analgesic:  *** Function:    ***    ROM:    ***     Pharmacotherapy Assessment  Analgesic: None MME/day: 0 mg/day   Monitoring: [place] PMP: PDMP reviewed during this encounter.       Pharmacotherapy: No side-effects or adverse reactions reported. Compliance: No problems identified. Effectiveness: Clinically acceptable.  No notes on file  No results found for: "CBDTHCR" No results found for: "D8THCCBX" No results found for: "D9THCCBX"  UDS:  Summary  Date Value Ref Range Status  01/28/2023 Note  Final    Comment:    ==================================================================== Compliance Drug Analysis, Ur ==================================================================== Test                             Result       Flag       Units  Drug Present and Declared for Prescription Verification   Gabapentin                     PRESENT      EXPECTED   Paroxetine                     PRESENT      EXPECTED   Acetaminophen                  PRESENT      EXPECTED    Clonidine                      PRESENT      EXPECTED   Metoprolol                     PRESENT      EXPECTED  Drug Present not Declared for Prescription Verification   Ibuprofen                      PRESENT      UNEXPECTED  Drug Absent but Declared for Prescription Verification   Tramadol                       Not Detected UNEXPECTED ng/mg creat   Methocarbamol                  Not Detected UNEXPECTED   Diphenhydramine                Not Detected UNEXPECTED ==================================================================== Test  Result    Flag   Units      Ref Range   Creatinine              61               mg/dL      >=60 ==================================================================== Declared Medications:  The flagging and interpretation on this report are based on the  following declared medications.  Unexpected results may arise from  inaccuracies in the declared medications.   **Note: The testing scope of this panel includes these medications:   Clonidine (Catapres)  Diphenhydramine (Benadryl)  Gabapentin (Neurontin)  Methocarbamol (Robaxin)  Metoprolol (Lopressor)  Paroxetine (Paxil)  Tramadol (Ultram)   **Note: The testing scope of this panel does not include small to  moderate amounts of these reported medications:   Acetaminophen (Tylenol)   **Note: The testing scope of this panel does not include the  following reported medications:   Atorvastatin (Lipitor)  Estradiol (Estrace)  Fluticasone (Flonase)  Furosemide (Lasix)  Hydralazine (Apresoline)  Hydrochlorothiazide (Hydrodiuril)  Levothyroxine (Synthroid)  Menthol  Nifedipine (Procardia)  Pantoprazole (Protonix)  Potassium (Klor-Con)  Vitamin E ==================================================================== For clinical consultation, please call [phone]. ====================================================================       ROS  Constitutional: Denies any  fever or chills Gastrointestinal: No reported hemesis, hematochezia, vomiting, or acute GI distress Musculoskeletal: Denies any acute onset joint swelling, redness, loss of ROM, or weakness Neurological: No reported episodes of acute onset apraxia, aphasia, dysarthria, agnosia, amnesia, paralysis, loss of coordination, or loss of consciousness  Medication Review  B Complex Vitamins, Menthol (Topical Analgesic), NIFEdipine, OVER THE COUNTER MEDICATION, PARoxetine, Potassium Chloride ER, Semaglutide-Weight Management, acetaminophen, atorvastatin, carvedilol, cloNIDine, diphenhydrAMINE, estradiol, fluticasone, furosemide, gabapentin, hydrALAZINE, levothyroxine, losartan, methocarbamol, pantoprazole, and vitamin E  History Review  Allergy: [name] is allergic to latex, crestor [rosuvastatin calcium], penicillins, tetanus toxoids, and tomato. Drug: [name]  reports no history of drug use. Alcohol:  reports that she does not currently use alcohol. Tobacco:  reports that she quit smoking about 53 years ago. Her smoking use included cigarettes. She started smoking about 58 years ago. She has a 1.3 pack-year smoking history. She has never used smokeless tobacco. Social: [name]  reports that she quit smoking about 53 years ago. Her smoking use included cigarettes. She started smoking about 58 years ago. She has a 1.3 pack-year smoking history. She has never used smokeless tobacco. She reports that she does not currently use alcohol. She reports that she does not use drugs. Medical:  has a past medical history of Abdominal pain of unknown etiology (02/05/2016), Anxiety, Arthritis, Asthma, Chronic back pain, Complication of anesthesia, Constipation, Depression, Dysphonia, Esophageal dysmotility, Fibromyalgia, Fibromyalgia, GERD (gastroesophageal reflux disease), Glaucoma, Headache, High cholesterol, History of DVT (deep vein thrombosis) (1989), History of hiatal hernia, History of  MRSA infection (2011), Hypertension, Hypothyroidism, Irregular heart rate, Low iron, Lumbago, Mild obstructive sleep apnea, Neuropathy, Pelvic pain in female, Peripheral vascular disease (HCC), SBO (small bowel obstruction) (HCC) (06/2016), Seasonal allergies, Short of breath on exertion, Urinary frequency, Vaginal atrophy, and Weakness. Surgical: [name]  has a past surgical history that includes Abdominal hysterectomy (1978); Nose surgery (2007); Mass excision (Left, 02/20/2013); Esophagogastroduodenoscopy (N/A, 07/15/2013); abdominal adhesions removed; Colonoscopy; Bunionectomy (Left, 2008); Shoulder open rotator cuff repair (Left, 05/05/2014); Esophageal manometry (N/A, 11/22/2014); Lumbar fusion (2014); REMOVAL HARDWARE L4-L5/  BILATERAL LAMINECTOMY L2 - L5 AND FUSION (06-12-2011); Laparoscopic cholecystectomy (01-11-2006); cysto with hydrodistension (N/A, 07/12/2015); Eye surgery (Bilateral); Tonsillectomy; Hardware Removal (N/A, 01/02/2016); Spine  surgery; laparoscopy (N/A, 07/06/2016); laparotomy (N/A, 07/06/2016); Back surgery; Colon surgery; Dilation and curettage of uterus; lipoma removal; Exploratory laparotomy; ingrown toenail removal; and Cataract extraction (Bilateral, 2017). Family: family history includes Aneurysm in her maternal grandmother; Cancer in her daughter, mother, and sister; Colon cancer in her father; Colon polyps in her father; Diabetes in her mother; Hypertension in her brother, brother, father, mother, and sister; Kidney disease in her brother and mother; Liver cancer in her brother and brother; Osteoarthritis in her mother; Stroke in her sister.  Laboratory Chemistry Profile   Renal Lab Results  Component Value Date   BUN 23 04/08/2023   CREATININE 1.17 (H) 04/08/2023   BCR 15 12/05/2022   GFRAA 66 07/28/2020   GFRNONAA 48 (L) 04/08/2023    Hepatic Lab Results  Component Value Date   AST 21 09/29/2022   ALT 16 09/29/2022   ALBUMIN 3.8 09/29/2022   ALKPHOS  103 09/29/2022   LIPASE 34 03/20/2021    Electrolytes Lab Results  Component Value Date   NA 139 04/08/2023   K 4.6 04/08/2023   CL 103 04/08/2023   CALCIUM 9.5 04/08/2023   MG 2.0 09/29/2022   PHOS 3.6 07/27/2016    Bone Lab Results  Component Value Date   25OHVITD1 47 01/28/2023   25OHVITD2 <1.0 01/28/2023   25OHVITD3 47 01/28/2023    Inflammation (CRP: Acute Phase) (ESR: Chronic Phase) Lab Results  Component Value Date   CRP 1 01/28/2023   ESRSEDRATE 34 01/28/2023   LATICACIDVEN 1.09 07/21/2016         Note: Above Lab results reviewed.  Recent Imaging Review  DG PAIN CLINIC C-ARM 1-60 MIN NO REPORT Fluoro was used, but no Radiologist interpretation will be provided.  Please refer to "NOTES" tab for provider progress note. Note: Reviewed        Physical Exam  General appearance: Well nourished, well developed, and well hydrated. In no apparent acute distress Mental status: Alert, oriented x 3 (person, place, & time)       Respiratory: No evidence of acute respiratory distress Eyes: PERLA Vitals: There were no vitals taken for this visit. BMI: Estimated body mass index is 42.73 kg/m as calculated from the following:   Height as of 06/06/23: 5\' 8"  (1.727 m).   Weight as of 06/06/23: 281 lb (127.5 kg). Ideal: Ideal body weight: 63.9 kg (140 lb 14 oz) Adjusted ideal body weight: 89.3 kg (196 lb 14.8 oz)  Assessment   Diagnosis Status  1. Chronic low back pain (1ry area of Pain) (Bilateral) (R>L) w/o sciatica   2. Lumbar facet joint pain   3. Lumbar facet syndrome   4. Postop check    Controlled Controlled Controlled   Updated Problems: No problems updated.  Plan of Care  Problem-specific:  No problem-specific Assessment & Plan notes found for this encounter.  [name] has a current medication list which includes the following long-term medication(s): atorvastatin, carvedilol, clonidine, fluticasone, furosemide, hydralazine,  levothyroxine, and nifedipine.  Pharmacotherapy (Medications Ordered): No orders of the defined types were placed in this encounter.  Orders:  No orders of the defined types were placed in this encounter.  Follow-up plan:   No follow-ups on file.      Interventional Therapies  Risk Factors  Considerations:  Hx. suicide attempt  Lumbar Post-Laminectomy Syndrome x6            Thoracolumbar HARDWARE (NO RFA)     Planned  Pending:   Diagnostic bilateral Thoracic  FCT (T9-12) MBB #1    Under consideration:   Diagnostic bilateral SI joint block #1  Diagnostic midline caudal ESI #1    Completed:   Diagnostic bilateral SI joint block x1 (03/12/2023)    Therapeutic  Palliative (PRN) options:   None established   Completed by other providers:   Therapeutic Nerve Blocks by [facility].          Recent Visits Date Type Provider Dept  05/21/23 Procedure visit Delano Metz, MD [facility]  04/23/23 Office Visit Delano Metz, MD [facility]  04/09/23 Procedure visit Delano Metz, MD [facility]  04/04/23 Office Visit Delano Metz, MD [facility]  03/12/23 Procedure visit Delano Metz, MD [facility]  Showing recent visits within past 90 days and meeting all other requirements Future Appointments Date Type Provider Dept  06/11/23 Appointment Delano Metz, MD [facility]  Showing future appointments within next 90 days and meeting all other requirements  I discussed the assessment and treatment plan with the patient. The patient was provided an opportunity to ask questions and all were answered. The patient agreed with the plan and demonstrated an understanding of the instructions.  Patient advised to call back or seek an in-person evaluation if the symptoms or condition worsens.  Duration of encounter: *** minutes.  Total time on encounter,  as per AMA guidelines included both the face-to-face and non-face-to-face time personally spent by the physician and/or other qualified health care professional(s) on the day of the encounter (includes time in activities that require the physician or other qualified health care professional and does not include time in activities normally performed by clinical staff). Physician's time may include the following activities when performed: Preparing to see the patient (e.g., pre-charting review of records, searching for previously ordered imaging, lab work, and nerve conduction tests) Review of prior analgesic pharmacotherapies. Reviewing PMP Interpreting ordered tests (e.g., lab work, imaging, nerve conduction tests) Performing post-procedure evaluations, including interpretation of diagnostic procedures Obtaining and/or reviewing separately obtained history Performing a medically appropriate examination and/or evaluation Counseling and educating the patient/family/caregiver Ordering medications, tests, or procedures Referring and communicating with other health care professionals (when not separately reported) Documenting clinical information in the electronic or other health record Independently interpreting results (not separately reported) and communicating results to the patient/ family/caregiver Care coordination (not separately reported)  Note by: Oswaldo Done, MD Date: 06/11/2023; Time: 6:49 AM

## 2023-06-11 ENCOUNTER — Encounter: Payer: Self-pay | Admitting: Pain Medicine

## 2023-06-11 ENCOUNTER — Ambulatory Visit: Payer: Medicare Other | Attending: Pain Medicine | Admitting: Pain Medicine

## 2023-06-11 VITALS — BP 148/75 | HR 91 | Temp 97.5°F | Resp 18 | Ht 65.0 in | Wt 281.0 lb

## 2023-06-11 DIAGNOSIS — H35371 Puckering of macula, right eye: Secondary | ICD-10-CM | POA: Diagnosis not present

## 2023-06-11 DIAGNOSIS — Z09 Encounter for follow-up examination after completed treatment for conditions other than malignant neoplasm: Secondary | ICD-10-CM | POA: Diagnosis present

## 2023-06-11 DIAGNOSIS — M5459 Other low back pain: Secondary | ICD-10-CM | POA: Insufficient documentation

## 2023-06-11 DIAGNOSIS — M47816 Spondylosis without myelopathy or radiculopathy, lumbar region: Secondary | ICD-10-CM | POA: Diagnosis present

## 2023-06-11 DIAGNOSIS — H52203 Unspecified astigmatism, bilateral: Secondary | ICD-10-CM | POA: Diagnosis not present

## 2023-06-11 DIAGNOSIS — M545 Low back pain, unspecified: Secondary | ICD-10-CM | POA: Insufficient documentation

## 2023-06-11 DIAGNOSIS — H04123 Dry eye syndrome of bilateral lacrimal glands: Secondary | ICD-10-CM | POA: Diagnosis not present

## 2023-06-11 DIAGNOSIS — H43813 Vitreous degeneration, bilateral: Secondary | ICD-10-CM | POA: Diagnosis not present

## 2023-06-11 DIAGNOSIS — H0100A Unspecified blepharitis right eye, upper and lower eyelids: Secondary | ICD-10-CM | POA: Diagnosis not present

## 2023-06-11 DIAGNOSIS — H01004 Unspecified blepharitis left upper eyelid: Secondary | ICD-10-CM | POA: Diagnosis not present

## 2023-06-11 DIAGNOSIS — G8929 Other chronic pain: Secondary | ICD-10-CM | POA: Diagnosis present

## 2023-06-11 DIAGNOSIS — H524 Presbyopia: Secondary | ICD-10-CM | POA: Diagnosis not present

## 2023-06-11 NOTE — Patient Instructions (Signed)

## 2023-06-17 ENCOUNTER — Other Ambulatory Visit: Payer: Self-pay | Admitting: Internal Medicine

## 2023-06-17 DIAGNOSIS — Z1231 Encounter for screening mammogram for malignant neoplasm of breast: Secondary | ICD-10-CM

## 2023-06-25 ENCOUNTER — Ambulatory Visit
Admission: RE | Admit: 2023-06-25 | Discharge: 2023-06-25 | Disposition: A | Discharge status: Home / Self Care | Payer: Medicare Other | Source: Ambulatory Visit | Attending: Internal Medicine

## 2023-06-25 DIAGNOSIS — Z1231 Encounter for screening mammogram for malignant neoplasm of breast: Secondary | ICD-10-CM | POA: Diagnosis not present

## 2023-06-27 ENCOUNTER — Telehealth: Payer: Self-pay | Admitting: Cardiology

## 2023-06-27 NOTE — Telephone Encounter (Signed)
The patient has been called to get clarification on Wegovy question: pt is wondering if we received the "paperwork" to request [facility]    Pt notified that the prior authorization for the med was denied and that this message would be forwarded to Dr Azucena Cecil for other options.  Pt verbalized understanding.

## 2023-06-27 NOTE — Telephone Encounter (Signed)
Pt c/o medication issue:  1. Name of Medication: WEGOVY   2. How are you currently taking this medication (dosage and times per day)?   3. Are you having a reaction (difficulty breathing--STAT)? No  4. What is your medication issue? Patient is calling to follow up on the PA for the medication. Please advise.

## 2023-07-04 ENCOUNTER — Ambulatory Visit
Admission: RE | Admit: 2023-07-04 | Discharge: 2023-07-04 | Disposition: A | Discharge status: Home / Self Care | Payer: Medicare Other | Source: Ambulatory Visit | Attending: Pain Medicine | Admitting: Pain Medicine

## 2023-07-04 ENCOUNTER — Ambulatory Visit: Payer: Medicare Other | Attending: Pain Medicine | Admitting: Pain Medicine

## 2023-07-04 ENCOUNTER — Encounter: Payer: Self-pay | Admitting: Pain Medicine

## 2023-07-04 VITALS — BP 131/98 | HR 85 | Temp 97.2°F | Resp 16 | Ht 65.0 in | Wt 281.0 lb

## 2023-07-04 DIAGNOSIS — T85848S Pain due to other internal prosthetic devices, implants and grafts, sequela: Secondary | ICD-10-CM | POA: Diagnosis not present

## 2023-07-04 DIAGNOSIS — M47817 Spondylosis without myelopathy or radiculopathy, lumbosacral region: Secondary | ICD-10-CM | POA: Insufficient documentation

## 2023-07-04 DIAGNOSIS — M545 Low back pain, unspecified: Secondary | ICD-10-CM | POA: Insufficient documentation

## 2023-07-04 DIAGNOSIS — M4325 Fusion of spine, thoracolumbar region: Secondary | ICD-10-CM | POA: Diagnosis not present

## 2023-07-04 DIAGNOSIS — G8929 Other chronic pain: Secondary | ICD-10-CM | POA: Diagnosis not present

## 2023-07-04 DIAGNOSIS — M47816 Spondylosis without myelopathy or radiculopathy, lumbar region: Secondary | ICD-10-CM | POA: Diagnosis not present

## 2023-07-04 DIAGNOSIS — R937 Abnormal findings on diagnostic imaging of other parts of musculoskeletal system: Secondary | ICD-10-CM | POA: Diagnosis not present

## 2023-07-04 DIAGNOSIS — M51379 Other intervertebral disc degeneration, lumbosacral region without mention of lumbar back pain or lower extremity pain: Secondary | ICD-10-CM | POA: Diagnosis not present

## 2023-07-04 DIAGNOSIS — M5459 Other low back pain: Secondary | ICD-10-CM | POA: Diagnosis not present

## 2023-07-04 MED ORDER — MIDAZOLAM HCL 5 MG/5ML IJ SOLN
INTRAMUSCULAR | Status: AC
  Filled 2023-07-04: qty 5

## 2023-07-04 MED ORDER — LIDOCAINE HCL 2 % IJ SOLN
20.0000 mL | Freq: Once | INTRAMUSCULAR | Status: AC
  Administered 2023-07-04: 400 mg

## 2023-07-04 MED ORDER — ROPIVACAINE HCL 2 MG/ML IJ SOLN
INTRAMUSCULAR | Status: AC
  Filled 2023-07-04: qty 20

## 2023-07-04 MED ORDER — FENTANYL CITRATE (PF) 100 MCG/2ML IJ SOLN: 25.0000 ug | INTRAMUSCULAR | Status: DC | PRN

## 2023-07-04 MED ORDER — LACTATED RINGERS IV SOLN: Freq: Once | INTRAVENOUS | Status: AC

## 2023-07-04 MED ORDER — ROPIVACAINE HCL 2 MG/ML IJ SOLN
18.0000 mL | Freq: Once | INTRAMUSCULAR | Status: AC
  Administered 2023-07-04: 18 mL via PERINEURAL

## 2023-07-04 MED ORDER — FENTANYL CITRATE (PF) 100 MCG/2ML IJ SOLN
INTRAMUSCULAR | Status: AC
  Filled 2023-07-04: qty 2

## 2023-07-04 MED ORDER — TRIAMCINOLONE ACETONIDE 40 MG/ML IJ SUSP
80.0000 mg | Freq: Once | INTRAMUSCULAR | Status: AC
  Administered 2023-07-04: 80 mg

## 2023-07-04 MED ORDER — LIDOCAINE HCL 2 % IJ SOLN
INTRAMUSCULAR | Status: AC
  Filled 2023-07-04: qty 20

## 2023-07-04 MED ORDER — PENTAFLUOROPROP-TETRAFLUOROETH EX AERO
INHALATION_SPRAY | Freq: Once | CUTANEOUS | Status: AC
  Administered 2023-07-04: 30 via TOPICAL
  Filled 2023-07-04: qty 30

## 2023-07-04 MED ORDER — MIDAZOLAM HCL 5 MG/5ML IJ SOLN
0.5000 mg | Freq: Once | INTRAMUSCULAR | Status: AC
  Administered 2023-07-04: 2 mg via INTRAVENOUS

## 2023-07-04 MED ORDER — TRIAMCINOLONE ACETONIDE 40 MG/ML IJ SUSP
INTRAMUSCULAR | Status: AC
  Filled 2023-07-04: qty 2

## 2023-07-04 NOTE — Progress Notes (Signed)
PROVIDER NOTE: Interpretation of information contained herein should be left to medically-trained personnel. Specific patient instructions are provided elsewhere under "Patient Instructions" section of medical record. This document was created in part using STT-dictation technology, any transcriptional errors that may result from this process are unintentional.  Patient: Amy [name] Type: Established DOB: 1946/07/29 MRN: [id number] PCP: Ollen Bowl, MD  Service: Procedure DOS: 07/04/2023 Setting: Ambulatory Location: Ambulatory outpatient facility Delivery: Face-to-face Provider: Oswaldo Done, MD Specialty: Interventional Pain Management Specialty designation: 09 Location: Outpatient facility Ref. Prov.: Delano Metz, MD       Interventional Therapy   Procedure: Lumbar Facet, Medial Branch Block(s) #2  Laterality: Bilateral  Level: L2, L3, L4, L5, and S1 Medial Branch Level(s). Injecting these levels blocks the L3-4, L4-5, and L5-S1 lumbar facet joints. Imaging: Fluoroscopic guidance         Anesthesia: Local anesthesia (1-2% Lidocaine) Anxiolysis: IV Versed 1.0 mg Sedation: Moderate Sedation Fentanyl 1 mL (50 mcg) DOS: 07/04/2023 Performed by: Oswaldo Done, MD  Primary Purpose: Diagnostic/Therapeutic Indications: Low back pain severe enough to impact quality of life or function. 1. Chronic low back pain (1ry area of Pain) (Bilateral) (R>L) w/o sciatica   2. Spondylosis without myelopathy or radiculopathy, lumbosacral region   3. Lumbar facet joint pain   4. Lumbar facet syndrome   5. Lumbar facet hypertrophy   6. Degeneration of intervertebral disc of lumbosacral region, unspecified whether pain present   7. Fusion of spine, thoracolumbar region (T10-L3)   8. Pain from implanted hardware (lumbar fusion)   9. Abnormal MRI, lumbar spine (04/13/2021)   10. Abnormal CT scan, lumbar spine (09/01/2021)    NAS-11 Pain score:   Pre-procedure: 6 /10    Post-procedure: 0-No pain/10     Position / Prep / Materials:  Position: Prone  Prep solution: DuraPrep (Iodine Povacrylex [0.7% available iodine] and Isopropyl Alcohol, 74% w/w) Area Prepped: Posterolateral Lumbosacral Spine (Wide prep: From the lower border of the scapula down to the end of the tailbone and from flank to flank.)  Materials:  Tray: Block Needle(s):  Type: Spinal  Gauge (G): 22  Length: 5-in Qty: 4      H&P (Pre-op Assessment):  Amy [name] is a 77 y.o. (year old), female patient, seen today for interventional treatment. She  has a past surgical history that includes Abdominal hysterectomy (1978); Nose surgery (2007); Mass excision (Left, 02/20/2013); Esophagogastroduodenoscopy (N/A, 07/15/2013); abdominal adhesions removed; Colonoscopy; Bunionectomy (Left, 2008); Shoulder open rotator cuff repair (Left, 05/05/2014); Esophageal manometry (N/A, 11/22/2014); Lumbar fusion (2014); REMOVAL HARDWARE L4-L5/  BILATERAL LAMINECTOMY L2 - L5 AND FUSION (06-12-2011); Laparoscopic cholecystectomy (01-11-2006); cysto with hydrodistension (N/A, 07/12/2015); Eye surgery (Bilateral); Tonsillectomy; Hardware Removal (N/A, 01/02/2016); Spine surgery; laparoscopy (N/A, 07/06/2016); laparotomy (N/A, 07/06/2016); Back surgery; Colon surgery; Dilation and curettage of uterus; lipoma removal; Exploratory laparotomy; ingrown toenail removal; and Cataract extraction (Bilateral, 2017). Amy [name] has a current medication list which includes the following prescription(s): acetaminophen, atorvastatin, b complex vitamins, carvedilol, clonidine, diphenhydramine, estradiol, fluticasone, furosemide, gabapentin, hydralazine, levothyroxine, losartan, menthol (topical analgesic), methocarbamol, nifedipine, OVER THE COUNTER MEDICATION, pantoprazole, paroxetine, potassium chloride er, semaglutide-weight management, [START ON 07/28/2023] semaglutide-weight management, [START ON 08/26/2023]  semaglutide-weight management, [START ON 09/24/2023] semaglutide-weight management, and vitamin e, and the following Facility-Administered Medications: fentanyl. Her primarily concern today is the Back Pain (Upper and lower)  Initial Vital Signs:  Pulse/HCG Rate: 99ECG Heart Rate: 95 Temp: (!) 97.2 F (36.2 C) Resp: 20 BP: 138/71 SpO2: 100 %  BMI: Estimated  body mass index is 46.76 kg/m as calculated from the following:   Height as of this encounter: 5\' 5"  (1.651 m).   Weight as of this encounter: 281 lb (127.5 kg).  Risk Assessment: Allergies: Reviewed. She is allergic to latex, crestor [rosuvastatin calcium], penicillins, tetanus toxoids, and tomato.  Allergy Precautions: None required Coagulopathies: Reviewed. None identified.  Blood-thinner therapy: None at this time Active Infection(s): Reviewed. None identified. Amy [name] is afebrile  Site Confirmation: Amy [name] was asked to confirm the procedure and laterality before marking the site Procedure checklist: Completed Consent: Before the procedure and under the influence of no sedative(s), amnesic(s), or anxiolytics, the patient was informed of the treatment options, risks and possible complications. To fulfill our ethical and legal obligations, as recommended by the American Medical Association's Code of Ethics, I have informed the patient of my clinical impression; the nature and purpose of the treatment or procedure; the risks, benefits, and possible complications of the intervention; the alternatives, including doing nothing; the risk(s) and benefit(s) of the alternative treatment(s) or procedure(s); and the risk(s) and benefit(s) of doing nothing. The patient was provided information about the general risks and possible complications associated with the procedure. These may include, but are not limited to: failure to achieve desired goals, infection, bleeding, organ or nerve damage, allergic reactions, paralysis,  and death. In addition, the patient was informed of those risks and complications associated to Spine-related procedures, such as failure to decrease pain; infection (i.e.: Meningitis, epidural or intraspinal abscess); bleeding (i.e.: epidural hematoma, subarachnoid hemorrhage, or any other type of intraspinal or peri-dural bleeding); organ or nerve damage (i.e.: Any type of peripheral nerve, nerve root, or spinal cord injury) with subsequent damage to sensory, motor, and/or autonomic systems, resulting in permanent pain, numbness, and/or weakness of one or several areas of the body; allergic reactions; (i.e.: anaphylactic reaction); and/or death. Furthermore, the patient was informed of those risks and complications associated with the medications. These include, but are not limited to: allergic reactions (i.e.: anaphylactic or anaphylactoid reaction(s)); adrenal axis suppression; blood sugar elevation that in diabetics may result in ketoacidosis or comma; water retention that in patients with history of congestive heart failure may result in shortness of breath, pulmonary edema, and decompensation with resultant heart failure; weight gain; swelling or edema; medication-induced neural toxicity; particulate matter embolism and blood vessel occlusion with resultant organ, and/or nervous system infarction; and/or aseptic necrosis of one or more joints. Finally, the patient was informed that Medicine is not an exact science; therefore, there is also the possibility of unforeseen or unpredictable risks and/or possible complications that may result in a catastrophic outcome. The patient indicated having understood very clearly. We have given the patient no guarantees and we have made no promises. Enough time was given to the patient to ask questions, all of which were answered to the patient's satisfaction. Amy [name] has indicated that she wanted to continue with the procedure. Attestation: I, the ordering  provider, attest that I have discussed with the patient the benefits, risks, side-effects, alternatives, likelihood of achieving goals, and potential problems during recovery for the procedure that I have provided informed consent. Date  Time: 07/04/2023  9:37 AM   Pre-Procedure Preparation:  Monitoring: As per clinic protocol. Respiration, ETCO2, SpO2, BP, heart rate and rhythm monitor placed and checked for adequate function Safety Precautions: Patient was assessed for positional comfort and pressure points before starting the procedure. Time-out: I initiated and conducted the "Time-out" before starting the procedure, as per protocol.  The patient was asked to participate by confirming the accuracy of the "Time Out" information. Verification of the correct person, site, and procedure were performed and confirmed by me, the nursing staff, and the patient. "Time-out" conducted as per Joint Commission's Universal Protocol (UP.01.01.01). Time: 1052 Start Time: 1052 hrs.  Description of Procedure:          Laterality: (see above) Targeted Levels: (see above)  Safety Precautions: Aspiration looking for blood return was conducted prior to all injections. At no point did we inject any substances, as a needle was being advanced. Before injecting, the patient was told to immediately notify me if she was experiencing any new onset of "ringing in the ears, or metallic taste in the mouth". No attempts were made at seeking any paresthesias. Safe injection practices and needle disposal techniques used. Medications properly checked for expiration dates. SDV (single dose vial) medications used. After the completion of the procedure, all disposable equipment used was discarded in the proper designated medical waste containers. Local Anesthesia: Protocol guidelines were followed. The patient was positioned over the fluoroscopy table. The area was prepped in the usual manner. The time-out was completed. The target  area was identified using fluoroscopy. A 12-in long, straight, sterile hemostat was used with fluoroscopic guidance to locate the targets for each level blocked. Once located, the skin was marked with an approved surgical skin marker. Once all sites were marked, the skin (epidermis, dermis, and hypodermis), as well as deeper tissues (fat, connective tissue and muscle) were infiltrated with a small amount of a short-acting local anesthetic, loaded on a 10cc syringe with a 25G, 1.5-in  Needle. An appropriate amount of time was allowed for local anesthetics to take effect before proceeding to the next step. Local Anesthetic: Lidocaine 2.0% The unused portion of the local anesthetic was discarded in the proper designated containers. Technical description of process:  L2 Medial Branch Nerve Block (MBB): The target area for the L2 medial branch is at the junction of the postero-lateral aspect of the superior articular process and the superior, posterior, and medial edge of the transverse process of L3. Under fluoroscopic guidance, a Quincke needle was inserted until contact was made with os over the superior postero-lateral aspect of the pedicular shadow (target area). After negative aspiration for blood, 0.5 mL of the nerve block solution was injected without difficulty or complication. The needle was removed intact. L3 Medial Branch Nerve Block (MBB): The target area for the L3 medial branch is at the junction of the postero-lateral aspect of the superior articular process and the superior, posterior, and medial edge of the transverse process of L4. Under fluoroscopic guidance, a Quincke needle was inserted until contact was made with os over the superior postero-lateral aspect of the pedicular shadow (target area). After negative aspiration for blood, 0.5 mL of the nerve block solution was injected without difficulty or complication. The needle was removed intact. L4 Medial Branch Nerve Block (MBB): The target  area for the L4 medial branch is at the junction of the postero-lateral aspect of the superior articular process and the superior, posterior, and medial edge of the transverse process of L5. Under fluoroscopic guidance, a Quincke needle was inserted until contact was made with os over the superior postero-lateral aspect of the pedicular shadow (target area). After negative aspiration for blood, 0.5 mL of the nerve block solution was injected without difficulty or complication. The needle was removed intact. L5 Medial Branch Nerve Block (MBB): The target area for the L5  medial branch is at the junction of the postero-lateral aspect of the superior articular process and the superior, posterior, and medial edge of the sacral ala. Under fluoroscopic guidance, a Quincke needle was inserted until contact was made with os over the superior postero-lateral aspect of the pedicular shadow (target area). After negative aspiration for blood, 0.5 mL of the nerve block solution was injected without difficulty or complication. The needle was removed intact. S1 Medial Branch Nerve Block (MBB): The target area for the S1 medial branch is at the posterior and inferior 6 o'clock position of the L5-S1 facet joint. Under fluoroscopic guidance, the Quincke needle inserted for the L5 MBB was redirected until contact was made with os over the inferior and postero aspect of the sacrum, at the 6 o' clock position under the L5-S1 facet joint (Target area). After negative aspiration for blood, 0.5 mL of the nerve block solution was injected without difficulty or complication. The needle was removed intact.  Once the entire procedure was completed, the treated area was cleaned, making sure to leave some of the prepping solution back to take advantage of its long term bactericidal properties.         Illustration of the posterior view of the lumbar spine and the posterior neural structures. Laminae of L2 through S1 are labeled. DPRL5,  dorsal primary ramus of L5; DPRS1, dorsal primary ramus of S1; DPR3, dorsal primary ramus of L3; FJ, facet (zygapophyseal) joint L3-L4; I, inferior articular process of L4; LB1, lateral branch of dorsal primary ramus of L1; IAB, inferior articular branches from L3 medial branch (supplies L4-L5 facet joint); IBP, intermediate branch plexus; MB3, medial branch of dorsal primary ramus of L3; NR3, third lumbar nerve root; S, superior articular process of L5; SAB, superior articular branches from L4 (supplies L4-5 facet joint also); TP3, transverse process of L3.   Facet Joint Innervation (* possible contribution)  L1-2 T12, L1 (L2*)  Medial Branch  L2-3 L1, L2 (L3*)         "          "  L3-4 L2, L3 (L4*)         "          "  L4-5 L3, L4 (L5*)         "          "  L5-S1 L4, L5, S1          "          "    Vitals:   07/04/23 1055 07/04/23 1100 07/04/23 1104 07/04/23 1118  BP: 123/80 119/85 127/88 (!) 131/98  Pulse:    85  Resp: 13 14 20 16   Temp:      SpO2: 100% 100% 100% 97%  Weight:      Height:         End Time: 1103 hrs.  Imaging Guidance (Spinal):          Type of Imaging Technique: Fluoroscopy Guidance (Spinal) Indication(s): Assistance in needle guidance and placement for procedures requiring needle placement in or near specific anatomical locations not easily accessible without such assistance. Exposure Time: Please see nurses notes. Contrast: None used. Fluoroscopic Guidance: I was personally present during the use of fluoroscopy. "Tunnel Vision Technique" used to obtain the best possible view of the target area. Parallax error corrected before commencing the procedure. "Direction-depth-direction" technique used to introduce the needle under continuous pulsed fluoroscopy. Once target was reached, antero-posterior, oblique, and lateral fluoroscopic  projection used confirm needle placement in all planes. Images permanently stored in EMR. Interpretation: No contrast injected. I  personally interpreted the imaging intraoperatively. Adequate needle placement confirmed in multiple planes. Permanent images saved into the patient's record.  Post-operative Assessment:  Post-procedure Vital Signs:  Pulse/HCG Rate: 8589 Temp: (!) 97.2 F (36.2 C) Resp: 16 BP: (!) 131/98 SpO2: 97 %  EBL: None  Complications: No immediate post-treatment complications observed by team, or reported by patient.  Note: The patient tolerated the entire procedure well. A repeat set of vitals were taken after the procedure and the patient was kept under observation following institutional policy, for this type of procedure. Post-procedural neurological assessment was performed, showing return to baseline, prior to discharge. The patient was provided with post-procedure discharge instructions, including a section on how to identify potential problems. Should any problems arise concerning this procedure, the patient was given instructions to immediately contact us, at any time, without hesitation. In any case, we plan to contact the patient by telephone for a follow-up status report regarding this interventional procedure.  Comments:  No additional relevant information.  Plan of Care (POC)  Orders:  Orders Placed This Encounter  Procedures   LUMBAR FACET(MEDIAL BRANCH NERVE BLOCK) MBNB    Scheduling Instructions:     Procedure: Lumbar facet block (AKA.: Lumbosacral medial branch nerve block)     Side: Bilateral     Level: L3-4, L4-5, L5-S1, and TBD Facets (L2, L3, L4, L5, S1, and TBD Medial Branch Nerves)     Sedation: Patient's choice.     Timeframe: Today    Order Specific Question:   Where will this procedure be performed?    Answer:   [facility]   DG [facility] C-ARM 1-60 MIN NO REPORT    Intraoperative interpretation by procedural physician at [facility].    Standing Status:   Standing    Number of Occurrences:   1    Order Specific Question:   Reason for exam:     Answer:   Assistance in needle guidance and placement for procedures requiring needle placement in or near specific anatomical locations not easily accessible without such assistance.   Informed Consent Details: Physician/Practitioner Attestation; Transcribe to consent form and obtain patient signature    Nursing Order: Transcribe to consent form and obtain patient signature. Note: Always confirm laterality of pain with Amy [name], before procedure.    Order Specific Question:   Physician/Practitioner attestation of informed consent for procedure/surgical case    Answer:   I, the physician/practitioner, attest that I have discussed with the patient the benefits, risks, side effects, alternatives, likelihood of achieving goals and potential problems during recovery for the procedure that I have provided informed consent.    Order Specific Question:   Procedure    Answer:   Lumbar Facet Block  under fluoroscopic guidance    Order Specific Question:   Physician/Practitioner performing the procedure    Answer:   Garrit Marrow A. Laban Emperor MD    Order Specific Question:   Indication/Reason    Answer:   Low Back Pain, with our without leg pain, due to Facet Joint Arthralgia (Joint Pain) Spondylosis (Arthritis of the Spine), without myelopathy or radiculopathy (Nerve Damage).   Provide equipment / supplies at bedside    Procedure tray: "Block Tray" (Disposable  single use) Skin infiltration needle: Regular 1.5-in, 25-G, (x1) Block Needle type: Spinal Amount/quantity: 4 Size: Medium (5-inch) Gauge: 22G    Standing Status:   Standing  Number of Occurrences:   1    Order Specific Question:   Specify    Answer:   Block Tray   Chronic Opioid Analgesic:  None MME/day: 0 mg/day   Medications ordered for procedure: Meds ordered this encounter  Medications   lidocaine (XYLOCAINE) 2 % (with pres) injection 400 mg   pentafluoroprop-tetrafluoroeth (GEBAUERS) aerosol   lactated ringers infusion    midazolam (VERSED) 5 MG/5ML injection 0.5-2 mg    Make sure Flumazenil is available in the pyxis when using this medication. If oversedation occurs, administer 0.2 mg IV over 15 sec. If after 45 sec no response, administer 0.2 mg again over 1 min; may repeat at 1 min intervals; not to exceed 4 doses (1 mg)   fentaNYL (SUBLIMAZE) injection 25-50 mcg    Make sure Narcan is available in the pyxis when using this medication. In the event of respiratory depression (RR< 8/min): Titrate NARCAN (naloxone) in increments of 0.1 to 0.2 mg IV at 2-3 minute intervals, until desired degree of reversal.   ropivacaine (PF) 2 mg/mL (0.2%) (NAROPIN) injection 18 mL   triamcinolone acetonide (KENALOG-40) injection 80 mg   Medications administered: We administered lidocaine, pentafluoroprop-tetrafluoroeth, lactated ringers, midazolam, ropivacaine (PF) 2 mg/mL (0.2%), and triamcinolone acetonide.  See the medical record for exact dosing, route, and time of administration.  Follow-up plan:   Return in about 2 weeks (around 07/18/2023) for (Face2F), (PPE).       Interventional Therapies  Risk Factors  Considerations:  Hx. suicide attempt  Lumbar Post-Laminectomy Syndrome x6            Thoracolumbar HARDWARE (NO RFA)     Planned  Pending:   Diagnostic bilateral Thoracic FCT (T9-12) MBB #1    Under consideration:   Diagnostic bilateral SI joint block #1  Diagnostic midline caudal ESI #1    Completed:   Diagnostic bilateral SI joint block x1 (03/12/2023)    Therapeutic  Palliative (PRN) options:   None established   Completed by other providers:   Therapeutic Nerve Blocks by [facility].           Recent Visits Date Type Provider Dept  06/11/23 Office Visit Delano Metz, MD [facility]  05/21/23 Procedure visit Delano Metz, MD [facility]  04/23/23 Office Visit Delano Metz, MD [facility]  04/09/23  Procedure visit Delano Metz, MD [facility]  Showing recent visits within past 90 days and meeting all other requirements Today's Visits Date Type Provider Dept  07/04/23 Procedure visit Delano Metz, MD [facility]  Showing today's visits and meeting all other requirements Future Appointments Date Type Provider Dept  07/18/23 Appointment Delano Metz, MD [facility]  Showing future appointments within next 90 days and meeting all other requirements  Disposition: Discharge home  Discharge (Date  Time): 07/04/2023; 1123 hrs.   Primary Care Physician: Ollen Bowl, MD Location: [facility] Note by: Oswaldo Done, MD (TTS technology used. I apologize for any typographical errors that were not detected and corrected.) Date: 07/04/2023; Time: 12:04 PM  Disclaimer:  Medicine is not an Visual merchandiser. The only guarantee in medicine is that nothing is guaranteed. It is important to note that the decision to proceed with this intervention was based on the information collected from the patient. The Data and conclusions were drawn from the patient's questionnaire, the interview, and the physical examination. Because the information was provided in large part by the  patient, it cannot be guaranteed that it has not been purposely or unconsciously manipulated. Every effort has been made to obtain as much relevant data as possible for this evaluation. It is important to note that the conclusions that lead to this procedure are derived in large part from the available data. Always take into account that the treatment will also be dependent on availability of resources and existing treatment guidelines, considered by other Pain Management Practitioners as being common knowledge and practice, at the time of the intervention. For Medico-Legal purposes, it is also important to point out that variation in procedural techniques  and pharmacological choices are the acceptable norm. The indications, contraindications, technique, and results of the above procedure should only be interpreted and judged by a Board-Certified Interventional Pain Specialist with extensive familiarity and expertise in the same exact procedure and technique.

## 2023-07-04 NOTE — Progress Notes (Signed)
Safety precautions to be maintained throughout the outpatient stay will include: orient to surroundings, keep bed in low position, maintain call bell within reach at all times, provide assistance with transfer out of bed and ambulation.  

## 2023-07-04 NOTE — Patient Instructions (Signed)

## 2023-07-05 ENCOUNTER — Telehealth: Payer: Self-pay | Admitting: *Deleted

## 2023-07-05 NOTE — Telephone Encounter (Signed)
Post procedure call; patient reports that everything went well overnight.  Will see at her f/up appt.

## 2023-07-16 ENCOUNTER — Encounter: Payer: Self-pay | Admitting: Cardiology

## 2023-07-16 ENCOUNTER — Ambulatory Visit: Payer: Medicare Other | Attending: Cardiology | Admitting: Cardiology

## 2023-07-16 VITALS — BP 120/64 | HR 84 | Ht 65.0 in | Wt 275.4 lb

## 2023-07-16 DIAGNOSIS — G4733 Obstructive sleep apnea (adult) (pediatric): Secondary | ICD-10-CM | POA: Insufficient documentation

## 2023-07-16 DIAGNOSIS — I471 Supraventricular tachycardia, unspecified: Secondary | ICD-10-CM | POA: Insufficient documentation

## 2023-07-16 DIAGNOSIS — I10 Essential (primary) hypertension: Secondary | ICD-10-CM | POA: Diagnosis not present

## 2023-07-16 DIAGNOSIS — I2510 Atherosclerotic heart disease of native coronary artery without angina pectoris: Secondary | ICD-10-CM | POA: Diagnosis not present

## 2023-07-16 MED ORDER — CARVEDILOL 12.5 MG PO TABS: 12.5000 mg | ORAL_TABLET | Freq: Two times a day (BID) | ORAL | 3 refills | Status: DC

## 2023-07-16 MED ORDER — CLONIDINE HCL 0.1 MG PO TABS: 0.1000 mg | ORAL_TABLET | Freq: Every day | ORAL | 0 refills | Status: AC

## 2023-07-16 NOTE — Progress Notes (Signed)
Cardiology Office Note:    Date:  07/16/2023   ID:  Amy [name], DOB [birth year], MRN [id number]  PCP:  Ollen Bowl, MD   [facility]  Cardiologist:  Debbe Odea, MD  Advanced Practice Provider:  No care team member to display Electrophysiologist:  None       Referring MD: Ollen Bowl, MD   Chief Complaint  Patient presents with   Follow-up    Patient reports she just doesn't feel good.  Palpitations happening more frequently with last episode this morning lasting 15 minutes that improved with rest.  Not able to get Wegovy due to insurance.    History of Present Illness:    Amy [name] is a 77 y.o. 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  States feeling tired, lack of energy.  Endorsed snoring.  Was diagnosed with sleep apnea years ago, given a CPAP machine, did not tolerate.  Blood pressures are well-controlled, tolerating current medications as prescribed.  Endorses occasional palpitations.  Previously prescribed Wegovy, not approved by [employer].  Prior notes Cardiac monitor 6/24 1 episode of paroxysmal SVT. Coronary CTA 10/2022, mild LAD stenosis 25% Lexiscan Myoview 01/2021 no evidence for ischemia, low risk study. Echo 02/07/2021 normal systolic function, EF 60 to 65%, Echo 06/2016 showed normal systolic function, EF 60 to 65%.  Impaired relaxation.  Past Medical History:  Diagnosis Date   Abdominal pain of unknown etiology 02/05/2016   Anxiety    Arthritis    ddd- RA   Asthma    "sleeping asthma"   Chronic back pain    lumbar steroid injection's   Complication of anesthesia    Hard to wake up. Pt sts is took 3 days.   Constipation    Depression    Dysphonia    intermittent "voice changes"   Esophageal dysmotility    Fibromyalgia    Fibromyalgia     GERD (gastroesophageal reflux disease)    Glaucoma    Headache    High cholesterol    ? pt states her doctor told her to continue eating vegetables & fruit   History of DVT (deep vein thrombosis) 1989   LEFT UPPER ARM   History of hiatal hernia    History of MRSA infection 2011   Hypertension    Hypothyroidism    Irregular heart rate    "years ago"   Low iron    Lumbago    Mild obstructive sleep apnea    per study 02-07-2006 - no cpap   Neuropathy    feet   Pelvic pain in female    Peripheral vascular disease (HCC)    "poor circulation"   SBO (small bowel obstruction) (HCC) 06/2016   Seasonal allergies    Short of breath on exertion    Urinary frequency    Vaginal atrophy    Weakness    both hands and feet    Past Surgical History:  Procedure Laterality Date   abdominal adhesions removed     ABDOMINAL HYSTERECTOMY  1978   BACK SURGERY     BUNIONECTOMY Left 2008   CATARACT EXTRACTION Bilateral 2017   COLON SURGERY     COLONOSCOPY     CYSTO WITH HYDRODISTENSION N/A 07/12/2015   Procedure: CYSTOSCOPY/HYDRODISTENSION;  Surgeon: Alfredo Martinez, MD;  Location: [facility];  Service: Urology;  Laterality: N/A;  DILATION AND CURETTAGE OF UTERUS     ESOPHAGEAL MANOMETRY N/A 11/22/2014   Procedure: ESOPHAGEAL MANOMETRY (EM);  Surgeon: Vertell Novak., MD;  Location: [facility];  Service: Endoscopy;  Laterality: N/A;   ESOPHAGOGASTRODUODENOSCOPY N/A 07/15/2013   Procedure: ESOPHAGOGASTRODUODENOSCOPY (EGD);  Surgeon: Vertell Novak., MD;  Location: [facility];  Service: Endoscopy;  Laterality: N/A;  need xray   EXPLORATORY LAPAROTOMY     EYE SURGERY Bilateral    cataract surgery with lens implants   HARDWARE REMOVAL N/A 01/02/2016   Procedure: Exploration of Lumbar Fusion,Removal of hardware Lumbar One-Two ;Redo Posterior Lumbar Fusion Lumbar One-Two;  Surgeon: Donalee Citrin, MD;  Location: [facility];  Service: Neurosurgery;  Laterality: N/A;    ingrown toenail removal     LAPAROSCOPIC CHOLECYSTECTOMY  01-11-2006   LAPAROSCOPY N/A 07/06/2016   Procedure: LAPAROSCOPY DIAGNOSTIC EMERGENT TO OPEN;  Surgeon: Gaynelle Adu, MD;  Location: [facility] OR;  Service: General;  Laterality: N/A;   LAPAROTOMY N/A 07/06/2016   Procedure: EXPLORATORY LAPAROTOMY LYSIS OF ADHESIONS FOR 3 HOURS;  Surgeon: Gaynelle Adu, MD;  Location: [facility] OR;  Service: General;  Laterality: N/A;   lipoma removal     LUMBAR FUSION  2014   L4 -- L5   MASS EXCISION Left 02/20/2013   Procedure: EXCISION LEFT BUTTOCK  MASS;  Surgeon: Ardeth Sportsman, MD;  Location: [facility];  Service: General;  Laterality: Left;   NOSE SURGERY  2007   REMOVAL HARDWARE L4-L5/  BILATERAL LAMINECTOMY L2 - L5 AND FUSION  06-12-2011   SHOULDER OPEN ROTATOR CUFF REPAIR Left 05/05/2014   Procedure: OPEN ACROMIONECTOMY AND OPEN REPAIR OF ROTATOR CUFF, TISSUEMEND GRAFT WITH ANCHOR ;  Surgeon: Jacki Cones, MD;  Location: [facility];  Service: Orthopedics;  Laterality: Left;   SPINE SURGERY     x6   TONSILLECTOMY      Current Medications: Current Meds  Medication Sig   acetaminophen (TYLENOL) 500 MG tablet Take 1,000 mg by mouth every 6 (six) hours as needed for moderate pain.    atorvastatin (LIPITOR) 20 MG tablet Take 1 tablet (20 mg total) by mouth daily.   B Complex Vitamins (B COMPLEX PO) Take 1 tablet by mouth daily as needed.   diphenhydrAMINE (BENADRYL) 25 MG tablet Take 25 mg by mouth 2 (two) times daily as needed for allergies.   estradiol (ESTRACE) 0.1 MG/GM vaginal cream Place 1 Applicatorful vaginally 2 (two) times a week.   fluticasone (FLONASE) 50 MCG/ACT nasal spray Place 1 spray into both nostrils daily.   furosemide (LASIX) 20 MG tablet Take 1 tablet (20 mg total) by mouth daily.   hydrALAZINE (APRESOLINE) 100 MG tablet Take 1 tablet (100 mg total) by mouth 2 (two) times daily.   levothyroxine (SYNTHROID) 25 MCG tablet Take 25 mcg by mouth every morning.   losartan (COZAAR) 100 MG tablet  Take 100 mg by mouth daily.   Menthol, Topical Analgesic, (ICY HOT BACK EX) Apply 1 application topically 2 (two) times daily as needed (pain).   methocarbamol (ROBAXIN) 500 MG tablet Take 500 mg by mouth 4 (four) times daily as needed for muscle spasms.   NIFEdipine (PROCARDIA XL/NIFEDICAL-XL) 90 MG 24 hr tablet Take 1 tablet (90 mg total) by mouth daily.   pantoprazole (PROTONIX) 40 MG tablet Take 80 mg by mouth daily.    PARoxetine (PAXIL) 30 MG tablet Take 30 mg by mouth daily.   vitamin E 180 MG (400 UNITS) capsule Take 400 Units by mouth  daily as needed.   [DISCONTINUED] carvedilol (COREG) 6.25 MG tablet Take 1 tablet (6.25 mg total) by mouth 2 (two) times daily.   [DISCONTINUED] cloNIDine (CATAPRES) 0.1 MG tablet Take 1 tablet (0.1 mg total) by mouth 2 (two) times daily.     Allergies:   Latex, Crestor [rosuvastatin calcium], Penicillins, Tetanus toxoids, and Tomato   Social History   Socioeconomic History   Marital status: Married    Spouse name: Not on file   Number of children: 1   Years of education: Not on file   Highest education level: Master's degree (e.g., MA, MS, MEng, MEd, MSW, MBA)  Occupational History   Not on file  Tobacco Use   Smoking status: Former    Current packs/day: 0.00    Average packs/day: 0.3 packs/day for 5.0 years (1.3 ttl pk-yrs)    Types: Cigarettes    Start date: 07/10/1964    Quit date: 07/10/1969    Years since quitting: 54.0   Smokeless tobacco: Never  Vaping Use   Vaping status: Never Used  Substance and Sexual Activity   Alcohol use: Not Currently    Comment: rare, social   Drug use: No   Sexual activity: Not on file  Other Topics Concern   Not on file  Social History Narrative   Lives at home with husband    Right handed   Caffeine: max 2 cups coffee per day but not does not drink daily.   Social Determinants of Health   Financial Resource Strain: Not on file  Food Insecurity: Not on file  Transportation Needs: Not on file   Physical Activity: Not on file  Stress: Not on file  Social Connections: Not on file     Family History: The patient's family history includes Aneurysm in her maternal grandmother; Cancer in her daughter, mother, and sister; Colon cancer in her father; Colon polyps in her father; Diabetes in her mother; Hypertension in her brother, brother, father, mother, and sister; Kidney disease in her brother and mother; Liver cancer in her brother and brother; Osteoarthritis in her mother; Stroke in her sister. There is no history of Migraines or Headache.  ROS:   Please see the history of present illness.     All other systems reviewed and are negative.  EKGs/Labs/Other Studies Reviewed:    The following studies were reviewed today:   EKG Interpretation Date/Time:  Tuesday July 16 2023 08:57:41 EDT Ventricular Rate:  85 PR Interval:  150 QRS Duration:  78 QT Interval:  356 QTC Calculation: 423 R Axis:   -34  Text Interpretation: Sinus rhythm with Premature supraventricular complexes and Premature ventricular complexes or Fusion complexes Left axis deviation Low voltage QRS Confirmed by Debbe Odea ([id number]) on 07/16/2023 9:09:46 AM    Recent Labs: 09/29/2022: ALT 16; Hemoglobin 13.5; Magnesium 2.0; Platelets 313 04/08/2023: BUN 23; Creatinine, Ser 1.17; Potassium 4.6; Sodium 139  Recent Lipid Panel    Component Value Date/Time   CHOL 165 07/03/2016 0627   TRIG 148 07/27/2016 0415   HDL 82 07/03/2016 0627   CHOLHDL 2.0 07/03/2016 0627   VLDL 8 07/03/2016 0627   LDLCALC 75 07/03/2016 0627     Risk Assessment/Calculations:      Physical Exam:    VS:  BP 120/64 (BP Location: Left Arm, Patient Position: Sitting, Cuff Size: Large)   Pulse 84   Ht 5\' 5"  (1.651 m)   Wt 275 lb 6.4 oz (124.9 kg)   SpO2 97%   BMI 45.83  kg/m     Wt Readings from Last 3 Encounters:  07/16/23 275 lb 6.4 oz (124.9 kg)  07/04/23 281 lb (127.5 kg)  06/11/23 281 lb (127.5 kg)     GEN:  Well  nourished, well developed in no acute distress HEENT: Normal NECK: No JVD; No carotid bruits CARDIAC: RRR, no murmurs, rubs, gallops RESPIRATORY:  Clear to auscultation without rales, wheezing or rhonchi  ABDOMEN: Soft, non-tender, non-distended MUSCULOSKELETAL:  trace edema; No deformit SKIN: Warm and dry NEUROLOGIC:  Alert and oriented x 3 PSYCHIATRIC:  Normal affect   ASSESSMENT:    1. Primary hypertension   2. Paroxysmal SVT (supraventricular tachycardia) (HCC)   3. Coronary artery disease involving native coronary artery of native heart, unspecified whether angina present   4. Morbid obesity (HCC)   5. OSA (obstructive sleep apnea)    PLAN:    In order of problems listed above:  Hypertension, BP controlled.  Continue nifedipine 90 mg daily, losartan 100 mg daily, hydralazine 100 mg 3 times daily.  Increase Coreg to 12.5 mg twice daily, reduce clonidine to 0.1 mg nightly.  Plan to titrate off clonidine further at follow-up visit..   Palpitations, occasional paroxysmal SVT.  Increase Coreg to 12.5 mg twice daily as above. Mild Nonobstructive CAD, ( proximal LAD stenosis 25%).  Denies chest pain. LDL at goal, continue Lipitor 20 mg daily.Marland Kitchen HFpEF, morbid obesity.  Continue Lasix 20 mg daily.  Untreated OSA, refer to sleep specialist.  Symptoms of fatigue, shortness of breath, snoring.  Follow-up in 3 to 4 months   Medication Adjustments/Labs and Tests Ordered: Current medicines are reviewed at length with the patient today.  Concerns regarding medicines are outlined above.  Orders Placed This Encounter  Procedures   Ambulatory referral to Pulmonology   EKG 12-Lead    Meds ordered this encounter  Medications   cloNIDine (CATAPRES) 0.1 MG tablet    Sig: Take 1 tablet (0.1 mg total) by mouth daily.    Dispense:  180 tablet    Refill:  0   carvedilol (COREG) 12.5 MG tablet    Sig: Take 1 tablet (12.5 mg total) by mouth 2 (two) times daily.    Dispense:  180 tablet     Refill:  3     Patient Instructions  Medication Instructions:   cloNIDine (CATAPRES) 0.1 MG tablet - Take one tablet ( 0.1mg ) by mouth daily.  2. INCREASE carvedilol (COREG) 12.5 MG tablet - Take one tablet (12.5mg ) by mouth twice a day.   *If you need a refill on your cardiac medications before your next appointment, please call your pharmacy*   Lab Work:  None Ordered  If you have labs (blood work) drawn today and your tests are completely normal, you will receive your results only by: MyChart Message (if you have MyChart) OR A paper copy in the mail If you have any lab test that is abnormal or we need to change your treatment, we will call you to review the results.   Testing/Procedures:  None Ordered   Follow-Up: At [facility], you and your health needs are our priority.  As part of our continuing mission to provide you with exceptional heart care, we have created designated Provider Care Teams.  These Care Teams include your primary Cardiologist (physician) and Advanced Practice Providers (APPs -  Physician Assistants and Nurse Practitioners) who all work together to provide you with the care you need, when you need it.  We recommend signing up for  the patient portal called "MyChart".  Sign up information is provided on this After Visit Summary.  MyChart is used to connect with patients for Virtual Visits (Telemedicine).  Patients are able to view lab/test results, encounter notes, upcoming appointments, etc.  Non-urgent messages can be sent to your provider as well.   To learn more about what you can do with MyChart, go to [email or web address].    Your next appointment:   3 - 4  month(s)  Provider:   Debbe Odea, MD ONLY       Signed, Debbe Odea, MD  07/16/2023 9:42 AM     [facility]

## 2023-07-16 NOTE — Patient Instructions (Signed)
Medication Instructions:   cloNIDine (CATAPRES) 0.1 MG tablet - Take one tablet ( 0.1mg ) by mouth daily.  2. INCREASE carvedilol (COREG) 12.5 MG tablet - Take one tablet (12.5mg ) by mouth twice a day.   *If you need a refill on your cardiac medications before your next appointment, please call your pharmacy*   Lab Work:  None Ordered  If you have labs (blood work) drawn today and your tests are completely normal, you will receive your results only by: MyChart Message (if you have MyChart) OR A paper copy in the mail If you have any lab test that is abnormal or we need to change your treatment, we will call you to review the results.   Testing/Procedures:  None Ordered   Follow-Up: At [facility], you and your health needs are our priority.  As part of our continuing mission to provide you with exceptional heart care, we have created designated Provider Care Teams.  These Care Teams include your primary Cardiologist (physician) and Advanced Practice Providers (APPs -  Physician Assistants and Nurse Practitioners) who all work together to provide you with the care you need, when you need it.  We recommend signing up for the patient portal called "MyChart".  Sign up information is provided on this After Visit Summary.  MyChart is used to connect with patients for Virtual Visits (Telemedicine).  Patients are able to view lab/test results, encounter notes, upcoming appointments, etc.  Non-urgent messages can be sent to your provider as well.   To learn more about what you can do with MyChart, go to [email or web address].    Your next appointment:   3 - 4  month(s)  Provider:   Debbe Odea, MD ONLY

## 2023-07-18 ENCOUNTER — Ambulatory Visit: Payer: Medicare Other | Attending: Pain Medicine | Admitting: Pain Medicine

## 2023-07-18 ENCOUNTER — Encounter: Payer: Self-pay | Admitting: Pain Medicine

## 2023-07-18 VITALS — BP 146/79 | HR 97 | Temp 97.3°F | Resp 16 | Ht 65.0 in | Wt 274.0 lb

## 2023-07-18 DIAGNOSIS — M47894 Other spondylosis, thoracic region: Secondary | ICD-10-CM | POA: Diagnosis not present

## 2023-07-18 DIAGNOSIS — M5459 Other low back pain: Secondary | ICD-10-CM | POA: Insufficient documentation

## 2023-07-18 DIAGNOSIS — G8929 Other chronic pain: Secondary | ICD-10-CM | POA: Insufficient documentation

## 2023-07-18 DIAGNOSIS — M546 Pain in thoracic spine: Secondary | ICD-10-CM | POA: Diagnosis not present

## 2023-07-18 DIAGNOSIS — T85848S Pain due to other internal prosthetic devices, implants and grafts, sequela: Secondary | ICD-10-CM | POA: Diagnosis not present

## 2023-07-18 DIAGNOSIS — M545 Low back pain, unspecified: Secondary | ICD-10-CM | POA: Insufficient documentation

## 2023-07-18 DIAGNOSIS — M47816 Spondylosis without myelopathy or radiculopathy, lumbar region: Secondary | ICD-10-CM | POA: Insufficient documentation

## 2023-07-18 DIAGNOSIS — M549 Dorsalgia, unspecified: Secondary | ICD-10-CM | POA: Insufficient documentation

## 2023-07-18 DIAGNOSIS — Z09 Encounter for follow-up examination after completed treatment for conditions other than malignant neoplasm: Secondary | ICD-10-CM | POA: Diagnosis not present

## 2023-07-18 DIAGNOSIS — M4325 Fusion of spine, thoracolumbar region: Secondary | ICD-10-CM | POA: Insufficient documentation

## 2023-07-18 NOTE — Progress Notes (Signed)
PROVIDER NOTE: Information contained herein reflects review and annotations entered in association with encounter. Interpretation of such information and data should be left to medically-trained personnel. Information provided to patient can be located elsewhere in the medical record under "Patient Instructions". Document created using STT-dictation technology, any transcriptional errors that may result from process are unintentional.    Patient: Amy [name]  Service Category: E/M  Provider: Oswaldo Done, MD  DOB: 1946-04-06  DOS: 07/18/2023  Referring Provider: Ollen Bowl, MD  MRN: [id number]  Specialty: Interventional Pain Management  PCP: Ollen Bowl, MD  Type: Established Patient  Setting: Ambulatory outpatient    Location: Office  Delivery: Face-to-face     HPI  [name], a 77 y.o. year old female, is here today because of her Chronic bilateral low back pain without sciatica [M54.50, G89.29]. 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); Chronic neck pain (Right); Lumbar spondylosis; Lumbar facet syndrome; Lumbar facet hypertrophy; Epidural fibrosis; Epidural lipomatosis; Neurogenic pain; Occipital headaches; Failed back surgical syndrome (6); History of lumbar spinal fusion; H/O nonunion of fracture; Trochanteric bursitis; Lumbar post-laminectomy syndrome; Sacroiliac  joint disease (Bilateral); Secondary malignant neoplasm of vertebral column (HCC); Thoracic radiculopathy open (T5/6) (Bilateral); Arthropathy; Displacement of lumbar intervertebral disc without myelopathy; DDD (degenerative disc disease), lumbosacral; Chronic thoracic back pain (2ry area of Pain) (Bilateral) (R>L); Thoracic foraminal stenosis (Bilateral); Abnormal CT of thoracic spine (06/25/2022); Abnormal MRI, thoracic spine (10/24/2021); Abnormal CT scan, lumbar spine (09/01/2021); Abnormal MRI, lumbar spine (04/13/2021); Chronic sacroiliitis (HCC) (Bilateral); Fusion of spine, thoracolumbar region (T10-L3); Chronic sacroiliac joint pain (Bilateral); Chronic upper back pain; Thoracic spine pain; Thoracic facet joint pain; Thoracic facet syndrome; Spondylosis without myelopathy or radiculopathy, thoracic region; Chronic hip pain (Left); Postlaminectomy syndrome, thoracic; History of fusion of thoracic spine; Lumbar facet joint pain; Spondylosis without myelopathy or radiculopathy, lumbosacral region; and Pain from implanted hardware (lumbar fusion) on their pertinent problem list. Pain Assessment: Severity of Chronic pain is reported as a 0-No pain (upper back-7)/10. Location: Back Upper/shoulders to hips bilat. Onset: More than a month ago. Quality: Constant, Aching. Timing: Constant. Modifying factor(s): otc, procedure. Vitals:  height is 5\' 5"  (1.651 m) and weight is 274 lb (124.3 kg). Her temporal temperature is 97.3 F (36.3 C) (abnormal). Her blood pressure is 146/79 (abnormal) and her pulse is 97. Her respiration is 16 and oxygen saturation is 97%.  BMI: Estimated body mass index is 45.6 kg/m as calculated from the following:   Height as of this encounter: 5\' 5"  (1.651 m).   Weight as of this encounter: 274 lb (124.3 kg). Last encounter: 06/11/2023. Last procedure: 07/04/2023.  Reason for encounter: post-procedure evaluation and assessment.  According to the patient she attained 100% relief of  the pain for the duration of the local anesthetic followed by an ongoing 100% relief of the pain in the treated area of the L3-4, L4-5, and L5-S1 facet joints.  According to her the treated area is not bothering her any longer, but she continues to have  pain in the thoracolumbar region where she had her prior fusion.  She has requested that we treat that area as well to see if we can also improve that pain in the thoracolumbar region.   Post-procedure evaluation   Procedure: Lumbar Facet, Medial Branch Block(s) #2  Laterality: Bilateral  Level: L2, L3, L4, L5, and S1 Medial Branch Level(s). Injecting these levels blocks the L3-4, L4-5, and L5-S1 lumbar facet joints. Imaging: Fluoroscopic guidance         Anesthesia: Local anesthesia (1-2% Lidocaine) Anxiolysis: IV Versed 1.0 mg Sedation: Moderate Sedation Fentanyl 1 mL (50 mcg) DOS: 07/04/2023 Performed by: Oswaldo Done, MD  Primary Purpose: Diagnostic/Therapeutic Indications: Low back pain severe enough to impact quality of life or function. 1. Chronic low back pain (1ry area of Pain) (Bilateral) (R>L) w/o sciatica   2. Spondylosis without myelopathy or radiculopathy, lumbosacral region   3. Lumbar facet joint pain   4. Lumbar facet syndrome   5. Lumbar facet hypertrophy   6. Degeneration of intervertebral disc of lumbosacral region, unspecified whether pain present   7. Fusion of spine, thoracolumbar region (T10-L3)   8. Pain from implanted hardware (lumbar fusion)   9. Abnormal MRI, lumbar spine (04/13/2021)   10. Abnormal CT scan, lumbar spine (09/01/2021)    NAS-11 Pain score:   Pre-procedure: 6 /10   Post-procedure: 0-No pain/10      Effectiveness:  Initial hour after procedure: 100 %. Subsequent 4-6 hours post-procedure: 100 %. Analgesia past initial 6 hours: 100 %. Ongoing improvement:  Analgesic: According to the patient she attained 100% relief of the pain for the duration of the local anesthetic followed by an  ongoing 100% relief of the pain in the treated area of the L3-4, L4-5, and L5-S1 facet joints. Function: Amy [name] reports improvement in function ROM: No improvement since the area he is fused.  Pharmacotherapy Assessment  Analgesic: None MME/day: 0 mg/day   Monitoring: [place] PMP: PDMP reviewed during this encounter.       Pharmacotherapy: No side-effects or adverse reactions reported. Compliance: No problems identified. Effectiveness: Clinically acceptable.  Nonah Mattes, RN  07/18/2023  2:40 PM  Sign when Signing Visit Safety precautions to be maintained throughout the outpatient stay will include: orient to surroundings, keep bed in low position, maintain call bell within reach at all times, provide assistance with transfer out of bed and ambulation.     No results found for: "CBDTHCR" No results found for: "D8THCCBX" No results found for: "D9THCCBX"  UDS:  Summary  Date Value Ref Range Status  01/28/2023 Note  Final    Comment:    ==================================================================== Compliance Drug Analysis, Ur ==================================================================== Test                             Result       Flag       Units  Drug Present and Declared for Prescription Verification   Gabapentin                     PRESENT      EXPECTED   Paroxetine                     PRESENT      EXPECTED   Acetaminophen                  PRESENT      EXPECTED   Clonidine  PRESENT      EXPECTED   Metoprolol                     PRESENT      EXPECTED  Drug Present not Declared for Prescription Verification   Ibuprofen                      PRESENT      UNEXPECTED  Drug Absent but Declared for Prescription Verification   Tramadol                       Not Detected UNEXPECTED ng/mg creat   Methocarbamol                  Not Detected UNEXPECTED   Diphenhydramine                Not Detected  UNEXPECTED ==================================================================== Test                      Result    Flag   Units      Ref Range   Creatinine              61               mg/dL      >=81 ==================================================================== Declared Medications:  The flagging and interpretation on this report are based on the  following declared medications.  Unexpected results may arise from  inaccuracies in the declared medications.   **Note: The testing scope of this panel includes these medications:   Clonidine (Catapres)  Diphenhydramine (Benadryl)  Gabapentin (Neurontin)  Methocarbamol (Robaxin)  Metoprolol (Lopressor)  Paroxetine (Paxil)  Tramadol (Ultram)   **Note: The testing scope of this panel does not include small to  moderate amounts of these reported medications:   Acetaminophen (Tylenol)   **Note: The testing scope of this panel does not include the  following reported medications:   Atorvastatin (Lipitor)  Estradiol (Estrace)  Fluticasone (Flonase)  Furosemide (Lasix)  Hydralazine (Apresoline)  Hydrochlorothiazide (Hydrodiuril)  Levothyroxine (Synthroid)  Menthol  Nifedipine (Procardia)  Pantoprazole (Protonix)  Potassium (Klor-Con)  Vitamin E ==================================================================== For clinical consultation, please call [phone]. ====================================================================       ROS  Constitutional: Denies any fever or chills Gastrointestinal: No reported hemesis, hematochezia, vomiting, or acute GI distress Musculoskeletal: Denies any acute onset joint swelling, redness, loss of ROM, or weakness Neurological: No reported episodes of acute onset apraxia, aphasia, dysarthria, agnosia, amnesia, paralysis, loss of coordination, or loss of consciousness  Medication Review  B Complex Vitamins, Menthol (Topical Analgesic), NIFEdipine, PARoxetine, acetaminophen,  atorvastatin, carvedilol, cloNIDine, diphenhydrAMINE, estradiol, fluticasone, furosemide, gabapentin, hydrALAZINE, levothyroxine, losartan, methocarbamol, pantoprazole, and vitamin E  History Review  Allergy: Ms. Potosky is allergic to latex, crestor [rosuvastatin calcium], penicillins, tetanus toxoids, and tomato. Drug: Ms. Jenkins  reports no history of drug use. Alcohol:  reports that she does not currently use alcohol. Tobacco:  reports that she quit smoking about 54 years ago. Her smoking use included cigarettes. She started smoking about 59 years ago. She has a 1.3 pack-year smoking history. She has never used smokeless tobacco. Social: Ms. Minott  reports that she quit smoking about 54 years ago. Her smoking use included cigarettes. She started smoking about 59 years ago. She has a 1.3 pack-year smoking history. She has never used smokeless tobacco. She reports that she does not currently use alcohol. She reports that she  does not use drugs. Medical:  has a past medical history of Abdominal pain of unknown etiology (02/05/2016), Anxiety, Arthritis, Asthma, Chronic back pain, Complication of anesthesia, Constipation, Depression, Dysphonia, Esophageal dysmotility, Fibromyalgia, Fibromyalgia, GERD (gastroesophageal reflux disease), Glaucoma, Headache, High cholesterol, History of DVT (deep vein thrombosis) (1989), History of hiatal hernia, History of MRSA infection (2011), Hypertension, Hypothyroidism, Irregular heart rate, Low iron, Lumbago, Mild obstructive sleep apnea, Neuropathy, Pelvic pain in female, Peripheral vascular disease (HCC), SBO (small bowel obstruction) (HCC) (06/2016), Seasonal allergies, Short of breath on exertion, Urinary frequency, Vaginal atrophy, and Weakness. Surgical: Ms. Venegas  has a past surgical history that includes Abdominal hysterectomy (1978); Nose surgery (2007); Mass excision (Left, 02/20/2013); Esophagogastroduodenoscopy (N/A,  07/15/2013); abdominal adhesions removed; Colonoscopy; Bunionectomy (Left, 2008); Shoulder open rotator cuff repair (Left, 05/05/2014); Esophageal manometry (N/A, 11/22/2014); Lumbar fusion (2014); REMOVAL HARDWARE L4-L5/  BILATERAL LAMINECTOMY L2 - L5 AND FUSION (06-12-2011); Laparoscopic cholecystectomy (01-11-2006); cysto with hydrodistension (N/A, 07/12/2015); Eye surgery (Bilateral); Tonsillectomy; Hardware Removal (N/A, 01/02/2016); Spine surgery; laparoscopy (N/A, 07/06/2016); laparotomy (N/A, 07/06/2016); Back surgery; Colon surgery; Dilation and curettage of uterus; lipoma removal; Exploratory laparotomy; ingrown toenail removal; and Cataract extraction (Bilateral, 2017). Family: family history includes Aneurysm in her maternal grandmother; Cancer in her daughter, mother, and sister; Colon cancer in her father; Colon polyps in her father; Diabetes in her mother; Hypertension in her brother, brother, father, mother, and sister; Kidney disease in her brother and mother; Liver cancer in her brother and brother; Osteoarthritis in her mother; Stroke in her sister.  Laboratory Chemistry Profile   Renal Lab Results  Component Value Date   BUN 23 04/08/2023   CREATININE 1.17 (H) 04/08/2023   BCR 15 12/05/2022   GFRAA 66 07/28/2020   GFRNONAA 48 (L) 04/08/2023    Hepatic Lab Results  Component Value Date   AST 21 09/29/2022   ALT 16 09/29/2022   ALBUMIN 3.8 09/29/2022   ALKPHOS 103 09/29/2022   LIPASE 34 03/20/2021    Electrolytes Lab Results  Component Value Date   NA 139 04/08/2023   K 4.6 04/08/2023   CL 103 04/08/2023   CALCIUM 9.5 04/08/2023   MG 2.0 09/29/2022   PHOS 3.6 07/27/2016    Bone Lab Results  Component Value Date   25OHVITD1 47 01/28/2023   25OHVITD2 <1.0 01/28/2023   25OHVITD3 47 01/28/2023    Inflammation (CRP: Acute Phase) (ESR: Chronic Phase) Lab Results  Component Value Date   CRP 1 01/28/2023   ESRSEDRATE 34 01/28/2023   LATICACIDVEN 1.09 07/21/2016          Note: Above Lab results reviewed.  Recent Imaging Review  DG PAIN CLINIC C-ARM 1-60 MIN NO REPORT Fluoro was used, but no Radiologist interpretation will be provided.  Please refer to "NOTES" tab for provider progress note. Note: Reviewed        Physical Exam  General appearance: Well nourished, well developed, and well hydrated. In no apparent acute distress Mental status: Alert, oriented x 3 (person, place, & time)       Respiratory: No evidence of acute respiratory distress Eyes: PERLA Vitals: BP (!) 146/79 (BP Location: Left Arm, Patient Position: Sitting, Cuff Size: Large)   Pulse 97   Temp (!) 97.3 F (36.3 C) (Temporal)   Resp 16   Ht 5\' 5"  (1.651 m)   Wt 274 lb (124.3 kg)   SpO2 97%   BMI 45.60 kg/m  BMI: Estimated body mass index is 45.6 kg/m as calculated from the following:  Height as of this encounter: 5\' 5"  (1.651 m).   Weight as of this encounter: 274 lb (124.3 kg). Ideal: Ideal body weight: 57 kg (125 lb 10.6 oz) Adjusted ideal body weight: 83.9 kg (185 lb)  Assessment   Diagnosis Status  1. Chronic low back pain (1ry area of Pain) (Bilateral) (R>L) w/o sciatica   2. Lumbar facet joint pain   3. Lumbar facet syndrome   4. Pain from implanted hardware (lumbar fusion)   5. Postop check   6. Chronic thoracic back pain (2ry area of Pain) (Bilateral) (R>L)   7. Chronic upper back pain   8. Fusion of spine, thoracolumbar region (T10-L3)   9. Thoracic facet syndrome   10. Thoracic facet joint pain   11. Thoracic spine pain    Controlled Controlled Controlled   Updated Problems: No problems updated.  Plan of Care  Problem-specific:  No problem-specific Assessment & Plan notes found for this encounter.  Ms. GLENNISHA LOUVIERE has a current medication list which includes the following long-term medication(s): atorvastatin, carvedilol, clonidine, fluticasone, furosemide, hydralazine, levothyroxine, and nifedipine.  Pharmacotherapy  (Medications Ordered): No orders of the defined types were placed in this encounter.  Orders:  Orders Placed This Encounter  Procedures   THORACIC FACET BLOCK    Standing Status:   Future    Standing Expiration Date:   10/18/2023    Scheduling Instructions:     Thoracic Medial Branch Block     Side: Bilateral     Sedation: With Sedation.     Timeframe: ASAP    Order Specific Question:   Where will this procedure be performed?    Answer:   [facility]   Nursing Instructions:    Please complete this patient's postprocedure evaluation.    Scheduling Instructions:     Please complete this patient's postprocedure evaluation.   Follow-up plan:   Return for (ECT): (B) Thoraco-lumbar FCT BLK #1.      Interventional Therapies  Risk Factors  Considerations:  Hx. suicide attempt  Lumbar Post-Laminectomy Syndrome x6            Thoracolumbar HARDWARE (NO RFA)     Planned  Pending:   Diagnostic bilateral Thoracic FCT (T9-12) MBB #2    Under consideration:   Diagnostic bilateral Thoracic FCT (T9-12) MBB #2    Completed:   Diagnostic bilateral L2-S1 lumbar facet MBB x2 (07/04/2023) (100/100/100/LBP:100)  Diagnostic bilateral T9-T12 thoracic facet MBB x1 (04/09/2023) (100/100/>75/75) pain changed to intermittent. Diagnostic bilateral SI joint BLK x1 (03/12/2023) (100/100/80 x 2 days/40) (improvement in PSIS area)   Therapeutic  Palliative (PRN) options:   None established   Completed by other providers:   Therapeutic Nerve Blocks by [facility].      Recent Visits Date Type Provider Dept  07/04/23 Procedure visit Delano Metz, MD [facility]  06/11/23 Office Visit Delano Metz, MD [facility]  05/21/23 Procedure visit Delano Metz, MD [facility]  04/23/23 Office Visit Delano Metz, MD [facility]  Showing recent visits within past 90 days and meeting all other  requirements Today's Visits Date Type Provider Dept  07/18/23 Office Visit Delano Metz, MD [facility]  Showing today's visits and meeting all other requirements Future Appointments No visits were found meeting these conditions. Showing future appointments within next 90 days and meeting all other requirements  I discussed the assessment and treatment plan with the patient. The patient was provided an opportunity to ask  questions and all were answered. The patient agreed with the plan and demonstrated an understanding of the instructions.  Patient advised to call back or seek an in-person evaluation if the symptoms or condition worsens.  Duration of encounter: 30 minutes.  Total time on encounter, as per AMA guidelines included both the face-to-face and non-face-to-face time personally spent by the physician and/or other qualified health care professional(s) on the day of the encounter (includes time in activities that require the physician or other qualified health care professional and does not include time in activities normally performed by clinical staff). Physician's time may include the following activities when performed: Preparing to see the patient (e.g., pre-charting review of records, searching for previously ordered imaging, lab work, and nerve conduction tests) Review of prior analgesic pharmacotherapies. Reviewing PMP Interpreting ordered tests (e.g., lab work, imaging, nerve conduction tests) Performing post-procedure evaluations, including interpretation of diagnostic procedures Obtaining and/or reviewing separately obtained history Performing a medically appropriate examination and/or evaluation Counseling and educating the patient/family/caregiver Ordering medications, tests, or procedures Referring and communicating with other health care professionals (when not separately reported) Documenting clinical information in the electronic or other health  record Independently interpreting results (not separately reported) and communicating results to the patient/ family/caregiver Care coordination (not separately reported)  Note by: Oswaldo Done, MD Date: 07/18/2023; Time: 3:20 PM

## 2023-07-18 NOTE — Patient Instructions (Signed)

## 2023-07-18 NOTE — Progress Notes (Signed)
Safety precautions to be maintained throughout the outpatient stay will include: orient to surroundings, keep bed in low position, maintain call bell within reach at all times, provide assistance with transfer out of bed and ambulation.  

## 2023-07-19 ENCOUNTER — Telehealth: Payer: Self-pay | Admitting: Student in an Organized Health Care Education/Training Program

## 2023-07-19 NOTE — Telephone Encounter (Signed)
Dr. Azucena Cecil has placed another referral for this pt asking for an OSA eval. Can we just order a sleep study since they were just seen in Sept?

## 2023-07-19 NOTE — Telephone Encounter (Deleted)
error 

## 2023-07-19 NOTE — Telephone Encounter (Signed)
Patient will need to evaluated by provider to determine if sleep study is needed. Sleep was not discussed during last visit.

## 2023-07-22 NOTE — Telephone Encounter (Signed)
Thank you :)

## 2023-08-28 NOTE — Patient Instructions (Signed)

## 2023-08-28 NOTE — Progress Notes (Unsigned)
PROVIDER NOTE: Interpretation of information contained herein should be left to medically-trained personnel. Specific patient instructions are provided elsewhere under "Patient Instructions" section of medical record. This document was created in part using STT-dictation technology, any transcriptional errors that may result from this process are unintentional.  Patient: Amy [name] Type: Established DOB: [birth year] MRN: [id number] PCP: Ollen Bowl, MD  Service: Procedure DOS: 08/29/2023 Setting: Ambulatory Location: Ambulatory outpatient facility Delivery: Face-to-face Provider: Oswaldo Done, MD Specialty: Interventional Pain Management Specialty designation: 09 Location: Outpatient facility Ref. Prov.: Delano Metz, MD       Interventional Therapy   Procedure: Thoracic facet medial branch block #1  Laterality: Bilateral (-50)  Level: T1, T2, T3, T4, T5, T6, T7, T8, T9, T10, T11, T12, and    ***    Medial Branch Level(s)  Imaging: Fluoroscopy-guided Spinal ([id number]) Anesthesia: Local anesthesia (1-2% Lidocaine) Anxiolysis: IV                 Sedation:                         DOS: 08/29/2023  Performed by: Oswaldo Done, MD  Purpose: Diagnostic/Therapeutic Indications: Thoracic back pain severe enough to impact quality of life or function. Rationale (medical necessity): procedure needed and proper for the diagnosis and/or treatment of [name]'s medical symptoms and needs. 1. Chronic low back pain (1ry area of Pain) (Bilateral) (R>L) w/o sciatica   2. Chronic thoracic back pain (2ry area of Pain) (Bilateral) (R>L)   3. Lumbar facet joint pain   4. Lumbar facet syndrome   5. Thoracic facet joint pain   6. Thoracic facet syndrome   7. Thoracic spine pain   8. Abnormal CT of thoracic spine (06/25/2022)   9. Abnormal CT scan, lumbar spine (09/01/2021)   10. Abnormal MRI, lumbar spine (04/13/2021)   11. Abnormal MRI, thoracic spine  (10/24/2021)    NAS-11 Pain score:   Pre-procedure:  /10   Post-procedure:  /10     Target: Thoracic posterior (dorsal) primary rami nerve Location: 2.5 cm lateral to midline (spinous process), just cephalad to upper transverse process edge.  (needle tip placement) Region: Thoracic  Approach: Paravertebral  Type of procedure: Percutaneous perineural nerve block   Pertinent Anatomy: There are two potential fascial compartments in the TPVS: the anterior extrapleural paravertebral compartment and the posterior subendothoracic paravertebral compartment. The transverse process and the superior costotransverse ligament form the posterior boundary.  Position / Prep / Materials:  Position: Prone  Prep solution: ChloraPrep (2% chlorhexidine gluconate and 70% isopropyl alcohol) Prep Area: Entire upper back region Materials:  Tray: Block Needle(s):  Type: Spinal  Gauge (G): 22  Length: 3.5-in  Qty:  ***    H&P (Pre-op Assessment):  Amy [name] is a 77 y.o. (year old), female patient, seen today for interventional treatment. She  has a past surgical history that includes Abdominal hysterectomy (1978); Nose surgery (2007); Mass excision (Left, 02/20/2013); Esophagogastroduodenoscopy (N/A, 07/15/2013); abdominal adhesions removed; Colonoscopy; Bunionectomy (Left, 2008); Shoulder open rotator cuff repair (Left, 05/05/2014); Esophageal manometry (N/A, 11/22/2014); Lumbar fusion (2014); REMOVAL HARDWARE L4-L5/  BILATERAL LAMINECTOMY L2 - L5 AND FUSION (06-12-2011); Laparoscopic cholecystectomy (01-11-2006); cysto with hydrodistension (N/A, 07/12/2015); Eye surgery (Bilateral); Tonsillectomy; Hardware Removal (N/A, 01/02/2016); Spine surgery; laparoscopy (N/A, 07/06/2016); laparotomy (N/A, 07/06/2016); Back surgery; Colon surgery; Dilation and curettage of uterus; lipoma removal; Exploratory laparotomy; ingrown toenail removal; and Cataract extraction (Bilateral, 2017). Amy [name] has a current  medication  list which includes the following prescription(s): acetaminophen, atorvastatin, b complex vitamins, carvedilol, clonidine, diphenhydramine, estradiol, fluticasone, furosemide, gabapentin, hydralazine, levothyroxine, losartan, menthol (topical analgesic), methocarbamol, nifedipine, pantoprazole, paroxetine, and vitamin e. Her primarily concern today is the No chief complaint on file.  Initial Vital Signs:  Pulse/HCG Rate:    Temp:   Resp:   BP:   SpO2:    BMI: Estimated body mass index is 45.6 kg/m as calculated from the following:   Height as of 07/18/23: 5\' 5"  (1.651 m).   Weight as of 07/18/23: 274 lb (124.3 kg).  Risk Assessment: Allergies: Reviewed. She is allergic to latex, crestor [rosuvastatin calcium], penicillins, tetanus toxoids, and tomato.  Allergy Precautions: None required Coagulopathies: Reviewed. None identified.  Blood-thinner therapy: None at this time Active Infection(s): Reviewed. None identified. Amy [name] is afebrile  Site Confirmation: Amy [name] was asked to confirm the procedure and laterality before marking the site Procedure checklist: Completed Consent: Before the procedure and under the influence of no sedative(s), amnesic(s), or anxiolytics, the patient was informed of the treatment options, risks and possible complications. To fulfill our ethical and legal obligations, as recommended by the American Medical Association's Code of Ethics, I have informed the patient of my clinical impression; the nature and purpose of the treatment or procedure; the risks, benefits, and possible complications of the intervention; the alternatives, including doing nothing; the risk(s) and benefit(s) of the alternative treatment(s) or procedure(s); and the risk(s) and benefit(s) of doing nothing. The patient was provided information about the general risks and possible complications associated with the procedure. These may include, but are not limited to:  failure to achieve desired goals, infection, bleeding, organ or nerve damage, allergic reactions, paralysis, and death. In addition, the patient was informed of those risks and complications associated to Spine-related procedures, such as failure to decrease pain; infection (i.e.: Meningitis, epidural or intraspinal abscess); bleeding (i.e.: epidural hematoma, subarachnoid hemorrhage, or any other type of intraspinal or peri-dural bleeding); organ or nerve damage (i.e.: Any type of peripheral nerve, nerve root, or spinal cord injury) with subsequent damage to sensory, motor, and/or autonomic systems, resulting in permanent pain, numbness, and/or weakness of one or several areas of the body; allergic reactions; (i.e.: anaphylactic reaction); and/or death. Furthermore, the patient was informed of those risks and complications associated with the medications. These include, but are not limited to: allergic reactions (i.e.: anaphylactic or anaphylactoid reaction(s)); adrenal axis suppression; blood sugar elevation that in diabetics may result in ketoacidosis or comma; water retention that in patients with history of congestive heart failure may result in shortness of breath, pulmonary edema, and decompensation with resultant heart failure; weight gain; swelling or edema; medication-induced neural toxicity; particulate matter embolism and blood vessel occlusion with resultant organ, and/or nervous system infarction; and/or aseptic necrosis of one or more joints. Finally, the patient was informed that Medicine is not an exact science; therefore, there is also the possibility of unforeseen or unpredictable risks and/or possible complications that may result in a catastrophic outcome. The patient indicated having understood very clearly. We have given the patient no guarantees and we have made no promises. Enough time was given to the patient to ask questions, all of which were answered to the patient's satisfaction. Ms.  Raynelle [name] has indicated that she wanted to continue with the procedure. Attestation: I, the ordering provider, attest that I have discussed with the patient the benefits, risks, side-effects, alternatives, likelihood of achieving goals, and potential problems during recovery for the procedure that I  have provided informed consent. Date  Time: {CHL [facility]-PAIN TIME CHOICES:[id number]}   Pre-Procedure Preparation:  Monitoring: As per clinic protocol. Respiration, ETCO2, SpO2, BP, heart rate and rhythm monitor placed and checked for adequate function Safety Precautions: Patient was assessed for positional comfort and pressure points before starting the procedure. Time-out: I initiated and conducted the "Time-out" before starting the procedure, as per protocol. The patient was asked to participate by confirming the accuracy of the "Time Out" information. Verification of the correct person, site, and procedure were performed and confirmed by me, the nursing staff, and the patient. "Time-out" conducted as per Joint Commission's Universal Protocol (UP.01.01.01). Time:   Start Time:   hrs.  Description/Narrative of Procedure:          Start Time:   hrs. Technical description of procedure:  Rationale (medical necessity): procedure needed and proper for the diagnosis and/or treatment of the patient's medical symptoms and needs. Procedural Technique Safety Precautions: Aspiration looking for blood return was conducted prior to all injections. At no point did we inject any substances, as a needle was being advanced. No attempts were made at seeking any paresthesias. Safe injection practices and needle disposal techniques used. Medications properly checked for expiration dates. SDV (single dose vial) medications used. Target Area: For the thoracic medial branch nerve, the target is located between the top of the transverse processes at the point where the transverse process joints the vertebra and the  superior-lateral aspect of the transverse process.  (More lateral towards the superior aspect of the thoracic spine.) For safety reasons and to minimize the risk of hemo- or pneumothorax, the needle tip was not advanced past the boundary of the posterior paravertebral compartment (superior costotransverse ligament). Description of the Procedure: Protocol guidelines were followed. The patient was placed in position over the fluoroscopy table. The target area was identified and the area prepped in the usual manner. Skin & deeper tissues infiltrated with local anesthetic. Appropriate amount of time allowed to pass for local anesthetics to take effect. The procedure needles were then advanced to the target area. Proper needle placement secured. Negative aspiration confirmed. Solution injected in intermittent fashion, asking for systemic symptoms every 0.5cc of injectate. The needles were then removed and the area cleansed, making sure to leave some of the prepping solution back to take advantage of its long term bactericidal properties.             Facet Joint Innervation  T1-2 C8, T1  Medial Branch  T2-3 T1, T2         "          "  T3-4 T2, T3         "          "  T4-5 T3, T4         "          "  T5-6 T4, T5         "          "  T6-7 T5, T6         "          "  T7-8 T6, T7         "          "  T8-9 T7, T8         "          "  T9-10 T8, T9         "          "  T10-11 T9, T10         "          "  T11-12 T10, T11         "          "  T12-L1 T11, T12         "          "    There were no vitals filed for this visit.   End Time:   hrs.  Imaging Guidance (Spinal):          Type of Imaging Technique: Fluoroscopy Guidance (Spinal) Indication(s): Fluoroscopy guidance for needle placement to enhance accuracy in procedures requiring precise needle localization for targeted delivery of medication in or near specific anatomical locations not easily accessible without such real-time imaging  assistance. Exposure Time: Please see nurses notes. Contrast: None used. Fluoroscopic Guidance: I was personally present during the use of fluoroscopy. "Tunnel Vision Technique" used to obtain the best possible view of the target area. Parallax error corrected before commencing the procedure. "Direction-depth-direction" technique used to introduce the needle under continuous pulsed fluoroscopy. Once target was reached, antero-posterior, oblique, and lateral fluoroscopic projection used confirm needle placement in all planes. Images permanently stored in EMR. Interpretation: No contrast injected. I personally interpreted the imaging intraoperatively. Adequate needle placement confirmed in multiple planes. Permanent images saved into the patient's record.  Post-operative Assessment:  Post-procedure Vital Signs:  Pulse/HCG Rate:    Temp:   Resp:   BP:   SpO2:    EBL: None  Complications: No immediate post-treatment complications observed by team, or reported by patient.  Note: The patient tolerated the entire procedure well. A repeat set of vitals were taken after the procedure and the patient was kept under observation following institutional policy, for this type of procedure. Post-procedural neurological assessment was performed, showing return to baseline, prior to discharge. The patient was provided with post-procedure discharge instructions, including a section on how to identify potential problems. Should any problems arise concerning this procedure, the patient was given instructions to immediately contact us, at any time, without hesitation. In any case, we plan to contact the patient by telephone for a follow-up status report regarding this interventional procedure.  Comments:  No additional relevant information.  Plan of Care (POC)  Orders:  No orders of the defined types were placed in this encounter.  Chronic Opioid Analgesic:  None MME/day: 0 mg/day   Medications ordered for  procedure: No orders of the defined types were placed in this encounter.  Medications administered: Amy [name] had no medications administered during this visit.  See the medical record for exact dosing, route, and time of administration.  Follow-up plan:   No follow-ups on file.       Interventional Therapies  Risk Factors  Considerations:  Hx. suicide attempt  Lumbar Post-Laminectomy Syndrome x6            Thoracolumbar HARDWARE (NO RFA)     Planned  Pending:   Diagnostic bilateral Thoracic FCT (T9-12) MBB #2    Under consideration:   Diagnostic bilateral Thoracic FCT (T9-12) MBB #2    Completed:   Diagnostic bilateral L2-S1 lumbar facet MBB x2 (07/04/2023) (100/100/100/LBP:100)  Diagnostic bilateral T9-T12 thoracic facet MBB x1 (04/09/2023) (100/100/>75/75) pain changed to intermittent. 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       Recent Visits Date Type Provider Dept  07/18/23 Office Visit Delano Metz, MD [facility]  07/04/23 Procedure visit Delano Metz, MD [facility]  06/11/23 Office Visit Delano Metz, MD [facility]  Showing recent visits within past 90 days and meeting all other requirements Future Appointments Date Type Provider Dept  08/29/23 Appointment Delano Metz, MD [facility]  Showing future appointments within next 90 days and meeting all other requirements  Disposition: Discharge home  Discharge (Date  Time): 08/29/2023;   hrs.   Primary Care Physician: Ollen Bowl, MD Location: [facility] Note by: Oswaldo Done, MD (TTS technology used. I apologize for any typographical errors that were  not detected and corrected.) Date: 08/29/2023; Time: 2:46 PM  Disclaimer:  Medicine is not an Visual merchandiser. The only guarantee in medicine is that nothing is guaranteed. It is important to note that the decision to proceed with this intervention was based on the information collected from the patient. The Data and conclusions were drawn from the patient's questionnaire, the interview, and the physical examination. Because the information was provided in large part by the patient, it cannot be guaranteed that it has not been purposely or unconsciously manipulated. Every effort has been made to obtain as much relevant data as possible for this evaluation. It is important to note that the conclusions that lead to this procedure are derived in large part from the available data. Always take into account that the treatment will also be dependent on availability of resources and existing treatment guidelines, considered by other Pain Management Practitioners as being common knowledge and practice, at the time of the intervention. For Medico-Legal purposes, it is also important to point out that variation in procedural techniques and pharmacological choices are the acceptable norm. The indications, contraindications, technique, and results of the above procedure should only be interpreted and judged by a Board-Certified Interventional Pain Specialist with extensive familiarity and expertise in the same exact procedure and technique.

## 2023-08-29 ENCOUNTER — Ambulatory Visit: Payer: Medicare Other | Attending: Pain Medicine | Admitting: Pain Medicine

## 2023-08-29 ENCOUNTER — Encounter: Payer: Self-pay | Admitting: Pain Medicine

## 2023-08-29 ENCOUNTER — Ambulatory Visit
Admission: RE | Admit: 2023-08-29 | Discharge: 2023-08-29 | Disposition: A | Discharge status: Home / Self Care | Payer: Medicare Other | Source: Ambulatory Visit | Attending: Pain Medicine | Admitting: Pain Medicine

## 2023-08-29 VITALS — BP 132/83 | HR 107 | Temp 97.2°F | Resp 16 | Ht 65.0 in | Wt 272.0 lb

## 2023-08-29 DIAGNOSIS — R937 Abnormal findings on diagnostic imaging of other parts of musculoskeletal system: Secondary | ICD-10-CM

## 2023-08-29 DIAGNOSIS — G8929 Other chronic pain: Secondary | ICD-10-CM

## 2023-08-29 DIAGNOSIS — M47816 Spondylosis without myelopathy or radiculopathy, lumbar region: Secondary | ICD-10-CM

## 2023-08-29 DIAGNOSIS — M545 Low back pain, unspecified: Secondary | ICD-10-CM

## 2023-08-29 DIAGNOSIS — M546 Pain in thoracic spine: Secondary | ICD-10-CM

## 2023-08-29 DIAGNOSIS — M47894 Other spondylosis, thoracic region: Secondary | ICD-10-CM

## 2023-08-29 DIAGNOSIS — Z5189 Encounter for other specified aftercare: Secondary | ICD-10-CM

## 2023-08-29 DIAGNOSIS — M5459 Other low back pain: Secondary | ICD-10-CM

## 2023-08-29 MED ORDER — PENTAFLUOROPROP-TETRAFLUOROETH EX AERO: INHALATION_SPRAY | Freq: Once | CUTANEOUS | Status: DC

## 2023-08-29 MED ORDER — MIDAZOLAM HCL 5 MG/5ML IJ SOLN: 0.5000 mg | Freq: Once | INTRAMUSCULAR | Status: DC

## 2023-08-29 MED ORDER — FENTANYL CITRATE (PF) 100 MCG/2ML IJ SOLN: 25.0000 ug | INTRAMUSCULAR | Status: DC | PRN

## 2023-08-29 MED ORDER — LACTATED RINGERS IV SOLN
Freq: Once | INTRAVENOUS | Status: DC
Start: 2023-08-29 — End: 2023-08-29

## 2023-08-29 MED ORDER — MIDAZOLAM HCL 5 MG/5ML IJ SOLN
INTRAMUSCULAR | Status: AC
Start: 2023-08-29 — End: ?
  Filled 2023-08-29: qty 5

## 2023-08-29 MED ORDER — LIDOCAINE HCL 2 % IJ SOLN
INTRAMUSCULAR | Status: AC
  Filled 2023-08-29: qty 20

## 2023-08-29 MED ORDER — DEXAMETHASONE SODIUM PHOSPHATE 10 MG/ML IJ SOLN
INTRAMUSCULAR | Status: AC
Start: 2023-08-29 — End: ?
  Filled 2023-08-29: qty 2

## 2023-08-29 MED ORDER — ROPIVACAINE HCL 2 MG/ML IJ SOLN
INTRAMUSCULAR | Status: AC
Start: 2023-08-29 — End: ?
  Filled 2023-08-29: qty 20

## 2023-08-29 MED ORDER — FENTANYL CITRATE (PF) 100 MCG/2ML IJ SOLN
INTRAMUSCULAR | Status: AC
  Filled 2023-08-29: qty 2

## 2023-08-29 MED ORDER — DEXAMETHASONE SODIUM PHOSPHATE 10 MG/ML IJ SOLN: 20.0000 mg | Freq: Once | INTRAMUSCULAR | Status: DC

## 2023-08-29 MED ORDER — ROPIVACAINE HCL 2 MG/ML IJ SOLN
18.0000 mL | Freq: Once | INTRAMUSCULAR | Status: DC
Start: 2023-08-29 — End: 2023-08-29

## 2023-08-29 MED ORDER — LIDOCAINE HCL 2 % IJ SOLN: 20.0000 mL | Freq: Once | INTRAMUSCULAR | Status: DC

## 2023-08-29 NOTE — Progress Notes (Signed)
Safety precautions to be maintained throughout the outpatient stay will include: orient to surroundings, keep bed in low position, maintain call bell within reach at all times, provide assistance with transfer out of bed and ambulation.  

## 2023-08-29 NOTE — Progress Notes (Signed)
1610 Upon receiving patient in OR for procedure, IV was checked for patency and was not running at all. IV catheter was exposed upon visual inspection. Discontinued per policy. Site clear.  [id number] After 3 unsuccessful IV attempts patient has decided to cancel and reschedule appointment for procedure. She was offered the option to do the procedure without sedation and she declined. Dr. Laban Emperor aware. IV attempts were made in left wrist, left anticubital ( per Lavonna Monarch, RN and left hand (per myself)  while in the OR. Patient tolerated well.  [id number] Patient placed in wheelchair and report to Virgina Jock RN to discharge.

## 2023-08-30 ENCOUNTER — Telehealth: Payer: Self-pay

## 2023-08-30 NOTE — Telephone Encounter (Signed)
Called PP. Denies any needs ta thie time. Instructed to call if needed

## 2023-09-02 ENCOUNTER — Telehealth: Payer: Self-pay

## 2023-09-02 NOTE — Telephone Encounter (Signed)
Spoke to patient and requested that she bring SD card to 12/10 appt.

## 2023-09-03 ENCOUNTER — Ambulatory Visit: Payer: Medicare Other | Admitting: Internal Medicine

## 2023-09-03 ENCOUNTER — Encounter: Payer: Self-pay | Admitting: Internal Medicine

## 2023-09-03 VITALS — BP 126/74 | HR 100 | Temp 97.9°F | Ht 65.0 in | Wt 269.0 lb

## 2023-09-03 DIAGNOSIS — G4733 Obstructive sleep apnea (adult) (pediatric): Secondary | ICD-10-CM

## 2023-09-03 DIAGNOSIS — R911 Solitary pulmonary nodule: Secondary | ICD-10-CM

## 2023-09-03 DIAGNOSIS — Z91199 Patient's noncompliance with other medical treatment and regimen due to unspecified reason: Secondary | ICD-10-CM | POA: Diagnosis not present

## 2023-09-03 DIAGNOSIS — Z6841 Body Mass Index (BMI) 40.0 and over, adult: Secondary | ICD-10-CM | POA: Diagnosis not present

## 2023-09-03 NOTE — Progress Notes (Signed)
Name: Amy [name] MRN: [id number] DOB: 03/17/1946    CHIEF COMPLAINT:  Diagnosis of sleep apnea   HISTORY OF PRESENT ILLNESS: Patient has previous problems and issues with sleep related to excessive daytime sleepiness Patient  has been having sleep problems for many years Patient has been having excessive daytime sleepiness for a long time Patient has been having extreme fatigue and tiredness, lack of energy +  very Loud snoring every night + struggling breathe at night and gasps for air + morning headaches + Nonrefreshing sleep  Patient diagnosed with sleep apnea in 2020 After further investigation with home sleep test and split-night sleep study seems like her  AHI was 37   Discussed sleep data and reviewed with patient.  Encouraged proper weight management.  Discussed driving precautions and its relationship with hypersomnolence.  Discussed operating dangerous equipment and its relationship with hypersomnolence.  Discussed sleep hygiene, and benefits of a fixed sleep waked time.  The importance of getting eight or more hours of sleep discussed with patient.  Discussed limiting the use of the computer and television before bedtime.  Decrease naps during the day, so night time sleep will become enhanced.  Limit caffeine, and sleep deprivation.  HTN, stroke, and heart failure are potential risk factors.    EPWORTH SLEEP SCORE 6   CPAP download reviewed in detail with patient Patient is very noncompliant due to intolerance of the machine and mask Auto CPAP 5-15 when used her AHI significantly reduced down to 2.6  We have discussed other options with patient including mouthguard and internal device At this time patient will strive harder to use CPAP  Patient is morbidly obese weighs 260 pounds Recommend significant weight loss  PAST MEDICAL HISTORY :   has a past medical history of Abdominal pain of unknown etiology (02/05/2016), Anxiety, Arthritis,  Asthma, Chronic back pain, Complication of anesthesia, Constipation, Depression, Dysphonia, Esophageal dysmotility, Fibromyalgia, Fibromyalgia, GERD (gastroesophageal reflux disease), Glaucoma, Headache, High cholesterol, History of DVT (deep vein thrombosis) (1989), History of hiatal hernia, History of MRSA infection (2011), Hypertension, Hypothyroidism, Irregular heart rate, Low iron, Lumbago, Mild obstructive sleep apnea, Neuropathy, Pelvic pain in female, Peripheral vascular disease (HCC), SBO (small bowel obstruction) (HCC) (06/2016), Seasonal allergies, Short of breath on exertion, Urinary frequency, Vaginal atrophy, and Weakness.  has a past surgical history that includes Abdominal hysterectomy (1978); Nose surgery (2007); Mass excision (Left, 02/20/2013); Esophagogastroduodenoscopy (N/A, 07/15/2013); abdominal adhesions removed; Colonoscopy; Bunionectomy (Left, 2008); Shoulder open rotator cuff repair (Left, 05/05/2014); Esophageal manometry (N/A, 11/22/2014); Lumbar fusion (2014); REMOVAL HARDWARE L4-L5/  BILATERAL LAMINECTOMY L2 - L5 AND FUSION (06-12-2011); Laparoscopic cholecystectomy (01-11-2006); cysto with hydrodistension (N/A, 07/12/2015); Eye surgery (Bilateral); Tonsillectomy; Hardware Removal (N/A, 01/02/2016); Spine surgery; laparoscopy (N/A, 07/06/2016); laparotomy (N/A, 07/06/2016); Back surgery; Colon surgery; Dilation and curettage of uterus; lipoma removal; Exploratory laparotomy; ingrown toenail removal; and Cataract extraction (Bilateral, 2017). Prior to Admission medications   Medication Sig Start Date End Date Taking? Authorizing Provider  acetaminophen (TYLENOL) 500 MG tablet Take 1,000 mg by mouth every 6 (six) hours as needed for moderate pain.    Yes [provider]  atorvastatin (LIPITOR) 20 MG tablet Take 1 tablet (20 mg total) by mouth daily. 01/16/21  Yes Debbe Odea, MD  B Complex Vitamins (B COMPLEX PO) Take 1 tablet by mouth daily as needed.   Yes [provider]  carvedilol (COREG) 12.5 MG tablet Take 1 tablet (12.5 mg total) by mouth 2 (two) times daily. 07/16/23 07/10/24 Yes Agbor-Etang, Arlys John,  MD  cloNIDine (CATAPRES) 0.1 MG tablet Take 1 tablet (0.1 mg total) by mouth daily. 07/16/23  Yes Debbe Odea, MD  diphenhydrAMINE (BENADRYL) 25 MG tablet Take 25 mg by mouth 2 (two) times daily as needed for allergies.   Yes [provider]  estradiol (ESTRACE) 0.1 MG/GM vaginal cream Place 1 Applicatorful vaginally 2 (two) times a week. 08/02/16  Yes Meuth, Brooke A, PA-C  fluticasone (FLONASE) 50 MCG/ACT nasal spray Place 1 spray into both nostrils daily. 08/02/16  Yes Meuth, Brooke A, PA-C  furosemide (LASIX) 20 MG tablet Take 1 tablet (20 mg total) by mouth daily. 04/22/23 04/16/24 Yes Agbor-Etang, Arlys John, MD  gabapentin (NEURONTIN) 100 MG capsule Take 100 mg by mouth 3 (three) times daily. Takes  100 mg AM and 200 mg HS   Yes [provider]  hydrALAZINE (APRESOLINE) 100 MG tablet Take 1 tablet (100 mg total) by mouth 2 (two) times daily. 04/22/23  Yes Debbe Odea, MD  levothyroxine (SYNTHROID) 25 MCG tablet Take 25 mcg by mouth every morning. 02/12/22  Yes [provider]  losartan (COZAAR) 100 MG tablet Take 100 mg by mouth daily. 01/07/23  Yes [provider]  Menthol, Topical Analgesic, (ICY HOT BACK EX) Apply 1 application topically 2 (two) times daily as needed (pain).   Yes [provider]  methocarbamol (ROBAXIN) 500 MG tablet Take 500 mg by mouth 4 (four) times daily as needed for muscle spasms. 03/28/21  Yes [provider]  NIFEdipine (PROCARDIA XL/NIFEDICAL-XL) 90 MG 24 hr tablet Take 1 tablet (90 mg total) by mouth daily. 04/22/23  Yes Agbor-Etang, Arlys John, MD  pantoprazole (PROTONIX) 40 MG tablet Take 80 mg by mouth daily.  01/17/18  Yes [provider]  PARoxetine (PAXIL) 30 MG tablet Take 30 mg by mouth daily.   Yes [provider]  vitamin E 180 MG (400  UNITS) capsule Take 400 Units by mouth daily as needed.   Yes [provider]   Allergies  Allergen Reactions   Latex Other (See Comments)    Sores, blisters - reaction to gloves   Crestor [Rosuvastatin Calcium]     Myalgias   Penicillins Other (See Comments)    UNSPECIFIED REACTION  "Has taken amoxicillin and ampicillin with no reaction" Has patient had a PCN reaction causing immediate rash, facial/tongue/throat swelling, SOB or lightheadedness with hypotension: Unknown Has patient had a PCN reaction causing severe rash involving mucus membranes or skin necrosis: Unknown Has patient had a PCN reaction that required hospitalization: Unknown Has patient had a PCN reaction occurring within the last 10 years: No    Tetanus Toxoids Rash   Tomato Rash    FAMILY HISTORY:  family history includes Aneurysm in her maternal grandmother; Cancer in her daughter, mother, and sister; Colon cancer in her father; Colon polyps in her father; Diabetes in her mother; Hypertension in her brother, brother, father, mother, and sister; Kidney disease in her brother and mother; Liver cancer in her brother and brother; Osteoarthritis in her mother; Stroke in her sister. SOCIAL HISTORY:  reports that she quit smoking about 54 years ago. Her smoking use included cigarettes. She started smoking about 59 years ago. She has a 1.3 pack-year smoking history. She has never used smokeless tobacco. She reports that she does not currently use alcohol. She reports that she does not use drugs.   Review of Systems:  Gen:  Denies  fever, sweats, chills weight loss  HEENT: Denies blurred vision, double vision, ear pain, eye  pain, hearing loss, nose bleeds, sore throat Cardiac:  No dizziness, chest pain or heaviness, chest tightness,edema, No JVD Resp:   No cough, -sputum production, -shortness of breath,-wheezing, -hemoptysis,  Gi: Denies swallowing difficulty, stomach pain, nausea or vomiting, diarrhea,  constipation, bowel incontinence Gu:  Denies bladder incontinence, burning urine Ext:   Denies Joint pain, stiffness or swelling Skin: Denies  skin rash, easy bruising or bleeding or hives Endoc:  Denies polyuria, polydipsia , polyphagia or weight change Psych:   Denies depression, insomnia or hallucinations  Other:  All other systems negative   ALL OTHER ROS ARE NEGATIVE  BP 126/74 (BP Location: Right Wrist, Cuff Size: Normal)   Pulse 100   Temp 97.9 F (36.6 C) (Temporal)   Ht 5\' 5"  (1.651 m)   Wt 269 lb (122 kg)   SpO2 99%   BMI 44.76 kg/m     Physical Examination:   General Appearance: No distress  EYES PERRLA, EOM intact.   NECK Supple, No JVD Pulmonary: normal breath sounds, No wheezing.  CardiovascularNormal S1,S2.  No m/r/g.   Abdomen: Benign, Soft, non-tender. Skin:   warm, no rashes, no ecchymosis  Extremities: normal, no cyanosis, clubbing. Neuro:without focal findings,  speech normal  PSYCHIATRIC: Mood, affect within normal limits.   ALL OTHER ROS ARE NEGATIVE    ASSESSMENT AND PLAN SYNOPSIS  77 yo morbidly obese Patient with signs and symptoms of excessive daytime sleepiness with underlying diagnosis of obstructive sleep apnea in the setting  deconditioned state She is noncompliant however CPAP download reviewed in detail with patient showed the significant improvement of her AHI when she is compliant with her CPAP At this time will change auto CPAP 5-10 Encouraged patient to use this prescription and CPAP nightly Patient is at a very high risk for MI stroke cardiac arrest death if noncompliant    Obesity -recommend significant weight loss -recommend changing diet  Deconditioned state -Recommend increased daily activity and exercise    Pulmonary Nodule RLL-. Repeat Imaging in August of 2024 shows the nodule to be stable.  CT scans in 1 to 2 years    MEDICATION ADJUSTMENTS/LABS AND TESTS ORDERED: Will Change setting on CPAP machine  5-12cm h20 Please use every night as prescribed Recommend weight loss Avoid secondhand smoke Avoid SICK contacts Recommend  Masking  when appropriate Recommend Keep up-to-date with vaccinations   CURRENT MEDICATIONS REVIEWED AT LENGTH WITH PATIENT TODAY   Patient  satisfied with Plan of action and management. All questions answered  Follow up  3 months  Total Time Spent  46 mins   Lucie Leather, M.D.  [facility]  Medical Director [facility] Medical Director [facility] Cardio-Pulmonary Department

## 2023-09-03 NOTE — Patient Instructions (Addendum)
Will Change setting on CPAP machine 5-12cm h20 Please use every night as prescribed Recommend weight loss Avoid secondhand smoke Avoid SICK contacts Recommend  Masking  when appropriate Recommend Keep up-to-date with vaccinations

## 2023-09-05 ENCOUNTER — Other Ambulatory Visit: Payer: Self-pay | Admitting: *Deleted

## 2023-09-05 MED ORDER — HYDRALAZINE HCL 100 MG PO TABS: 100.0000 mg | ORAL_TABLET | Freq: Two times a day (BID) | ORAL | 0 refills | Status: AC

## 2023-09-10 ENCOUNTER — Ambulatory Visit
Admission: RE | Admit: 2023-09-10 | Discharge: 2023-09-10 | Disposition: A | Discharge status: Home / Self Care | Payer: Medicare Other | Source: Ambulatory Visit | Attending: Pain Medicine | Admitting: Pain Medicine

## 2023-09-10 ENCOUNTER — Encounter: Payer: Self-pay | Admitting: Pain Medicine

## 2023-09-10 ENCOUNTER — Ambulatory Visit: Payer: Medicare Other | Attending: Pain Medicine | Admitting: Pain Medicine

## 2023-09-10 VITALS — BP 129/72 | HR 85 | Temp 97.2°F | Resp 16 | Ht 65.0 in | Wt 268.0 lb

## 2023-09-10 DIAGNOSIS — M546 Pain in thoracic spine: Secondary | ICD-10-CM | POA: Diagnosis not present

## 2023-09-10 DIAGNOSIS — M549 Dorsalgia, unspecified: Secondary | ICD-10-CM | POA: Diagnosis not present

## 2023-09-10 DIAGNOSIS — M961 Postlaminectomy syndrome, not elsewhere classified: Secondary | ICD-10-CM | POA: Insufficient documentation

## 2023-09-10 DIAGNOSIS — M47894 Other spondylosis, thoracic region: Secondary | ICD-10-CM | POA: Diagnosis not present

## 2023-09-10 DIAGNOSIS — M47814 Spondylosis without myelopathy or radiculopathy, thoracic region: Secondary | ICD-10-CM | POA: Insufficient documentation

## 2023-09-10 DIAGNOSIS — G8929 Other chronic pain: Secondary | ICD-10-CM | POA: Diagnosis not present

## 2023-09-10 DIAGNOSIS — M4325 Fusion of spine, thoracolumbar region: Secondary | ICD-10-CM | POA: Diagnosis not present

## 2023-09-10 MED ORDER — LACTATED RINGERS IV SOLN: Freq: Once | INTRAVENOUS | Status: AC

## 2023-09-10 MED ORDER — FENTANYL CITRATE (PF) 100 MCG/2ML IJ SOLN
INTRAMUSCULAR | Status: AC
  Filled 2023-09-10: qty 2

## 2023-09-10 MED ORDER — ROPIVACAINE HCL 2 MG/ML IJ SOLN
INTRAMUSCULAR | Status: AC
  Filled 2023-09-10: qty 20

## 2023-09-10 MED ORDER — LIDOCAINE HCL 2 % IJ SOLN
INTRAMUSCULAR | Status: AC
  Filled 2023-09-10: qty 20

## 2023-09-10 MED ORDER — DEXAMETHASONE SODIUM PHOSPHATE 10 MG/ML IJ SOLN
20.0000 mg | Freq: Once | INTRAMUSCULAR | Status: AC
Start: 2023-09-10 — End: 2023-09-10
  Administered 2023-09-10: 20 mg

## 2023-09-10 MED ORDER — PENTAFLUOROPROP-TETRAFLUOROETH EX AERO
INHALATION_SPRAY | Freq: Once | CUTANEOUS | Status: AC
  Administered 2023-09-10: 30 via TOPICAL

## 2023-09-10 MED ORDER — DEXAMETHASONE SODIUM PHOSPHATE 10 MG/ML IJ SOLN
INTRAMUSCULAR | Status: AC
  Filled 2023-09-10: qty 2

## 2023-09-10 MED ORDER — MIDAZOLAM HCL 5 MG/5ML IJ SOLN
INTRAMUSCULAR | Status: AC
  Filled 2023-09-10: qty 5

## 2023-09-10 MED ORDER — LIDOCAINE HCL 2 % IJ SOLN
20.0000 mL | Freq: Once | INTRAMUSCULAR | Status: AC
Start: 2023-09-10 — End: 2023-09-10
  Administered 2023-09-10: 400 mg

## 2023-09-10 MED ORDER — MIDAZOLAM HCL 5 MG/5ML IJ SOLN
0.5000 mg | Freq: Once | INTRAMUSCULAR | Status: AC
  Administered 2023-09-10: 2 mg via INTRAVENOUS

## 2023-09-10 MED ORDER — ROPIVACAINE HCL 2 MG/ML IJ SOLN
18.0000 mL | Freq: Once | INTRAMUSCULAR | Status: AC
  Administered 2023-09-10: 18 mL via PERINEURAL

## 2023-09-10 MED ORDER — FENTANYL CITRATE (PF) 100 MCG/2ML IJ SOLN: 25.0000 ug | INTRAMUSCULAR | Status: DC | PRN

## 2023-09-10 NOTE — Patient Instructions (Signed)
______________________________________________________________________    Post-Procedure Discharge Instructions  Instructions: Apply ice:  Purpose: This will minimize any swelling and discomfort after procedure.  When: Day of procedure, as soon as you get home. How: Fill a plastic sandwich bag with crushed ice. Cover it with a small towel and apply to injection site. How long: (15 min on, 15 min off) Apply for 15 minutes then remove x 15 minutes.  Repeat sequence on day of procedure, until you go to bed. Apply heat:  Purpose: To treat any soreness and discomfort from the procedure. When: Starting the next day after the procedure. How: Apply heat to procedure site starting the day following the procedure. How long: May continue to repeat daily, until discomfort goes away. Food intake: Start with clear liquids (like water) and advance to regular food, as tolerated.  Physical activities: Keep activities to a minimum for the first 8 hours after the procedure. After that, then as tolerated. Driving: If you have received any sedation, be responsible and do not drive. You are not allowed to drive for 24 hours after having sedation. Blood thinner: (Applies only to those taking blood thinners) You may restart your blood thinner 6 hours after your procedure. Insulin: (Applies only to Diabetic patients taking insulin) As soon as you can eat, you may resume your normal dosing schedule. Infection prevention: Keep procedure site clean and dry. Shower daily and clean area with soap and water. Post-procedure Pain Diary: Extremely important that this be done correctly and accurately. Recorded information will be used to determine the next step in treatment. For the purpose of accuracy, follow these rules: Evaluate only the area treated. Do not report or include pain from an untreated area. For the purpose of this evaluation, ignore all other areas of pain, except for the treated area. After your procedure,  avoid taking a long nap and attempting to complete the pain diary after you wake up. Instead, set your alarm clock to go off every hour, on the hour, for the initial 8 hours after the procedure. Document the duration of the numbing medicine, and the relief you are getting from it. Do not go to sleep and attempt to complete it later. It will not be accurate. If you received sedation, it is likely that you were given a medication that may cause amnesia. Because of this, completing the diary at a later time may cause the information to be inaccurate. This information is needed to plan your care. Follow-up appointment: Keep your post-procedure follow-up evaluation appointment after the procedure (usually 2 weeks for most procedures, 6 weeks for radiofrequencies). DO NOT FORGET to bring you pain diary with you.   Expect: (What should I expect to see with my procedure?) From numbing medicine (AKA: Local Anesthetics): Numbness or decrease in pain. You may also experience some weakness, which if present, could last for the duration of the local anesthetic. Onset: Full effect within 15 minutes of injected. Duration: It will depend on the type of local anesthetic used. On the average, 1 to 8 hours.  From steroids (Applies only if steroids were used): Decrease in swelling or inflammation. Once inflammation is improved, relief of the pain will follow. Onset of benefits: Depends on the amount of swelling present. The more swelling, the longer it will take for the benefits to be seen. In some cases, up to 10 days. Duration: Steroids will stay in the system x 2 weeks. Duration of benefits will depend on multiple posibilities including persistent irritating factors. Side-effects: If  present, they may typically last 2 weeks (the duration of the steroids). Frequent: Cramps (if they occur, drink Gatorade and take over-the-counter Magnesium 450-500 mg once to twice a day); water retention with temporary weight gain;  increases in blood sugar; decreased immune system response; increased appetite. Occasional: Facial flushing (red, warm cheeks); mood swings; menstrual changes. Uncommon: Long-term decrease or suppression of natural hormones; bone thinning. (These are more common with higher doses or more frequent use. This is why we prefer that our patients avoid having any injection therapies in other practices.)  Very Rare: Severe mood changes; psychosis; aseptic necrosis. From procedure: Some discomfort is to be expected once the numbing medicine wears off. This should be minimal if ice and heat are applied as instructed.  Call if: (When should I call?) You experience numbness and weakness that gets worse with time, as opposed to wearing off. New onset bowel or bladder incontinence. (Applies only to procedures done in the spine)  Emergency Numbers: Durning business hours (Monday - Thursday, 8:00 AM - 4:00 PM) (Friday, 9:00 AM - 12:00 Noon): [phone] After hours: [phone] NOTE: If you are having a problem and are unable connect with, or to talk to a provider, then go to your nearest urgent care or emergency department. If the problem is serious and urgent, please call 911. ______________________________________________________________________      ______________________________________________________________________    Patient information on: Body mass index (BMI) and Weight Management  Dear Amy [name] you are receiving this information because your weight may be adversely affecting your health.   Your current Estimated body mass index is 44.76 kg/m as calculated from the following:   Height as of 09/03/23: 5\' 5"  (1.651 m).   Weight as of 09/03/23: 269 lb (122 kg).  We recommend you talk to your primary care physician about providing or referring you to a supervised weight management program.  Here is some information about weight and the body mass index (BMI)  classification:  BMI is a measure of obesity that's calculated by dividing a person's weight in kilograms by their height in meters squared. A person can use an online calculator to determine their BMI. Body mass index (BMI) is a common tool for deciding whether a person has an appropriate body weight.  It measures a person's weight in relation to their height.  According to the [facility] of health (NIH): A BMI of less than 18.5 means that a person is underweight. A BMI of between 18.5 and 24.9 is ideal. A BMI of between 25 and 29.9 is overweight. A BMI over 30 indicates obesity.  Body Mass Index (BMI) Classification BMI level (kg/m2) Category Associated incidence of chronic pain  <18  Underweight   18.5-24.9 Ideal body weight   25-29.9 Overweight  20%  30-34.9 Obese (Class I)  68%  35-39.9 Severe obesity (Class II)  136%  >40 Extreme obesity (Class III)  254%    Morbidly Obese Classification: You will be considered to be "Morbidly Obese" if your BMI is above 30 and you have one or more of the following conditions caused or associated to obesity: 1.    Type 2 Diabetes (Leading to cardiovascular diseases (CVD), stroke, peripheral vascular diseases (PVD), retinopathy, nephropathy, and neuropathy) 2.    Cardiovascular Disease (High Blood Pressure; Congestive Heart Failure; High Cholesterol; Coronary Artery Disease; Angina; Arrhythmias, Dysrhythmias, or Heart Attacks) 3.    Breathing problems (Asthma; obesity-hypoventilation syndrome; obstructive sleep apnea; chronic inflammatory airway disease; reactive airway disease; or shortness  of breath) 4.    Chronic kidney disease 5.    Liver disease (nonalcoholic fatty liver disease) 6.    High blood pressure 7.    Acid reflux (gastroesophageal reflux disease; heartburn) 8.    Osteoarthritis (OA) (affecting the hip(s), the knee(s) and/or the lower back) (usually requiring knee and/or hip replacements, as well as back surgeries) 9.    Low  back pain (Lumbar Facet Syndrome; and/or Degenerative Disc Disease) 10.  Hip pain (Osteoarthritis of hip) (For every 1 lbs of added body weight, there is a 2 lbs increase in pressure inside of each hip articulation. 1:2 mechanical relationship) 11.  Knee pain (Osteoarthritis of knee) (For every 1 lbs of added body weight, there is a 4 lbs increase in pressure inside of each knee articulation. 1:4 mechanical relationship) (patients with a BMI>30 kg/m2 were 6.8 times more likely to develop knee OA than normal-weight individuals) 12.  Cancer: Epidemiological studies have shown that obesity is a risk factor for: post-menopausal breast cancer; cancers of the endometrium, colon and kidney cancer; malignant adenomas of the esophagus. Obese subjects have an approximately 1.5-3.5-fold increased risk of developing these cancers compared with normal-weight subjects, and it has been estimated that between 15 and 45% of these cancers can be attributed to overweight. More recent studies suggest that obesity may also increase the risk of other types of cancer, including pancreatic, hepatic and gallbladder cancer. (Ref: Obesity and cancer. Pischon T, Nthlings U, [employer] H. Proc Nutr Soc. 2008 May;67(2):128-45. doi: 10.1017/S0029665108006976.) The International Agency for Research on Cancer (IARC) has identified 13 cancers associated with overweight and obesity: meningioma, multiple myeloma, adenocarcinoma of the esophagus, and cancers of the thyroid, postmenopausal breast cancer, gallbladder, stomach, liver, pancreas, kidney, ovaries, uterus, colon and rectal (colorectal) cancers. 55 percent of all cancers diagnosed in women and 24 percent of those diagnosed in men are associated with overweight and obesity.  Recommendation: If you have any of the above conditions it is urgent that you take a step back and concentrate in losing weight. Dedicate 100% of your efforts on this task. Nothing else will improve your health more than  bringing your weight down and your BMI to less than 30.   Nutritionist and/or supervised weight-management program: We are aware that most chronic pain patients are unable to exercise secondary to their pain. For this reason, you must rely on proper nutrition and diet in order to lose the weight. We recommend you talk to a nutritionist.   Bariatric surgery: A person might be considered a candidate for bariatric surgery if they meet one of the following BMI criteria:  BMI of 40 or higher: This is considered extreme obesity (Class III). BMI of 35-39.9: This is considered obesity, and the person might also have a serious weight-related health condition, such as high blood pressure, type 2 diabetes, or severe sleep apnea  BMI of 30-34.9: This might be considered if the person has serious weight-related health problems and hasn't had substantial weight loss or improvement in co-morbidities through other methods   On your own: A realistic goal is to lose 10% of your body weight over a period of 12 months.  If over a period of six (6) months you have unsuccessfully tried to lose weight, then it is time for you to seek professional help and to enter a medically supervised weight management program, and/or undergo bariatric surgery.   Pain management considerations and possible limitations:  1.    Pharmacological Problems: Be advised that the use  of opioid analgesics (oxycodone; hydrocodone; morphine; methadone; codeine; and all of their derivatives) have been associated with decreased metabolism and weight gain.  For this reason, should we see that you are unable to lose weight while taking these medications, it may become necessary for [place] to taper down and indefinitely discontinue them.  2.    Technical Problems: The incidence of successful interventional therapies decreases as the patient's BMI increases. It is much more difficult to accomplish a safe and effective interventional therapy on a patient with a  BMI above 35. 3.    Radiation Exposure Problems: The x-rays machine, used to accomplish injection therapies, will automatically increase their x-ray output in order to capture an appropriate bone image. This means that radiation exposure increases exponentially with the patient's BMI. (The higher the BMI, the higher the radiation exposure.) Although the level of radiation used at a given time is still safe to the patient, it is not for the physician and/or assisting staff. Unfortunately, radiation exposure is accumulative. Because physicians and the staff have to do procedures and be exposed on a daily basis, this can result in health problems such as cancer and radiation burns. Radiation exposure to the staff is monitored by the radiation batches that they wear. The exposure levels are reported back to the staff on a quarterly basis. Depending on levels of exposure, physicians and staff may be obligated by law to decrease this exposure. This means that they have the right and obligation to refuse providing therapies where they may be overexposed to radiation. For this reason, physicians may decline to offer therapies such as radiofrequency ablation or implants to patients with a BMI above 40. 4.    Current Trends: Be advised that the current trend is to no longer offer certain therapies to patients with a BMI equal to, or above 35, due to increase perioperative risks, increased technical procedural difficulties, and excessive radiation exposure to healthcare personnel.  Last updated: 06/17/2023 ______________________________________________________________________

## 2023-09-10 NOTE — Progress Notes (Signed)
PROVIDER NOTE: Interpretation of information contained herein should be left to medically-trained personnel. Specific patient instructions are provided elsewhere under "Patient Instructions" section of medical record. This document was created in part using STT-dictation technology, any transcriptional errors that may result from this process are unintentional.  Patient: Amy [name]: Established DOB: 1945/12/06 MRN: [id number] PCP: Ollen Bowl, MD  Service: Procedure DOS: 09/10/2023 Setting: Ambulatory Location: Ambulatory outpatient facility Delivery: Face-to-face Provider: Oswaldo Done, MD Specialty: Interventional Pain Management Specialty designation: 09 Location: Outpatient facility Ref. Prov.: Pahwani, Kasandra Knudsen, MD       Interventional Therapy   Procedure: Thoracic facet medial branch block #2  Laterality: Bilateral (-50)  Level: T9, T10, T11, and T12 Medial Branch Level(s)  Imaging: Fluoroscopy-guided Spinal ([id number]) Anesthesia: Local anesthesia (1-2% Lidocaine) Anxiolysis: IV Versed 2.0 mg Sedation: Moderate Sedation None required. No Fentanyl administered.         DOS: 09/10/2023  Performed by: Oswaldo Done, MD  Purpose: Diagnostic/Therapeutic Indications: Thoracic back pain severe enough to impact quality of life or function. Rationale (medical necessity): procedure needed and proper for the diagnosis and/or treatment of [name]'s medical symptoms and needs. 1. Chronic thoracic back pain (2ry area of Pain) (Bilateral) (R>L)   2. Chronic upper back pain   3. Thoracic facet joint pain   4. Thoracic facet syndrome   5. Thoracic spine pain   6. Spondylosis without myelopathy or radiculopathy, thoracic region   7. Fusion of spine, thoracolumbar region (T10-L3)   8. Postlaminectomy syndrome, thoracic   9. Obesity, Class III, BMI 40-49.9 (morbid obesity) (HCC)    NAS-11 Pain score:   Pre-procedure: 7 /10   Post-procedure:  0-No pain/10     Target: Thoracic posterior (dorsal) primary rami nerve Location: 2.5 cm lateral to midline (spinous process), just cephalad to upper transverse process edge.  (needle tip placement) Region: Thoracic  Approach: Paravertebral  [name] of procedure: Percutaneous perineural nerve block   Pertinent Anatomy: There are two potential fascial compartments in the TPVS: the anterior extrapleural paravertebral compartment and the posterior subendothoracic paravertebral compartment. The transverse process and the superior costotransverse ligament form the posterior boundary.  Position / Prep / Materials:  Position: Prone  Prep solution: ChloraPrep (2% chlorhexidine gluconate and 70% isopropyl alcohol) Prep Area: Entire upper back region Materials:  Tray: Block Needle(s):  [name]: Spinal  Gauge (G): 22  Length: 3.5-in  Qty:  4   H&P (Pre-op Assessment):  Amy [name] is a 77 y.o. (year old), female patient, seen today for interventional treatment. She  has a past surgical history that includes Abdominal hysterectomy (1978); Nose surgery (2007); Mass excision (Left, 02/20/2013); Esophagogastroduodenoscopy (N/A, 07/15/2013); abdominal adhesions removed; Colonoscopy; Bunionectomy (Left, 2008); Shoulder open rotator cuff repair (Left, 05/05/2014); Esophageal manometry (N/A, 11/22/2014); Lumbar fusion (2014); REMOVAL HARDWARE L4-L5/  BILATERAL LAMINECTOMY L2 - L5 AND FUSION (06-12-2011); Laparoscopic cholecystectomy (01-11-2006); cysto with hydrodistension (N/A, 07/12/2015); Eye surgery (Bilateral); Tonsillectomy; Hardware Removal (N/A, 01/02/2016); Spine surgery; laparoscopy (N/A, 07/06/2016); laparotomy (N/A, 07/06/2016); Back surgery; Colon surgery; Dilation and curettage of uterus; lipoma removal; Exploratory laparotomy; ingrown toenail removal; and Cataract extraction (Bilateral, 2017). Amy [name] has a current medication list which includes the following prescription(s):  acetaminophen, atorvastatin, b complex vitamins, carvedilol, clonidine, diphenhydramine, estradiol, fluticasone, furosemide, gabapentin, hydralazine, levothyroxine, losartan, menthol (topical analgesic), methocarbamol, nifedipine, pantoprazole, paroxetine, and vitamin e, and the following Facility-Administered Medications: fentanyl and lactated ringers. Her primarily concern today is the Back Pain (middle)  Initial Vital Signs:  Pulse/HCG Rate:  97  Temp: (!) 97.2 F (36.2 C) Resp: 14 BP: 132/72 SpO2: 99 %  BMI: Estimated body mass index is 44.6 kg/m as calculated from the following:   Height as of this encounter: 5\' 5"  (1.651 m).   Weight as of this encounter: 268 lb (121.6 kg).  Risk Assessment: Allergies: Reviewed. She is allergic to latex, crestor [rosuvastatin calcium], penicillins, tetanus toxoids, and tomato.  Allergy Precautions: None required Coagulopathies: Reviewed. None identified.  Blood-thinner therapy: None at this time Active Infection(s): Reviewed. None identified. Amy [name] is afebrile  Site Confirmation: Amy [name] was asked to confirm the procedure and laterality before marking the site Procedure checklist: Completed Consent: Before the procedure and under the influence of no sedative(s), amnesic(s), or anxiolytics, the patient was informed of the treatment options, risks and possible complications. To fulfill our ethical and legal obligations, as recommended by the American Medical Association's Code of Ethics, I have informed the patient of my clinical impression; the nature and purpose of the treatment or procedure; the risks, benefits, and possible complications of the intervention; the alternatives, including doing nothing; the risk(s) and benefit(s) of the alternative treatment(s) or procedure(s); and the risk(s) and benefit(s) of doing nothing. The patient was provided information about the general risks and possible complications associated with  the procedure. These may include, but are not limited to: failure to achieve desired goals, infection, bleeding, organ or nerve damage, allergic reactions, paralysis, and death. In addition, the patient was informed of those risks and complications associated to Spine-related procedures, such as failure to decrease pain; infection (i.e.: Meningitis, epidural or intraspinal abscess); bleeding (i.e.: epidural hematoma, subarachnoid hemorrhage, or any other [name] of intraspinal or peri-dural bleeding); organ or nerve damage (i.e.: Any [name] of peripheral nerve, nerve root, or spinal cord injury) with subsequent damage to sensory, motor, and/or autonomic systems, resulting in permanent pain, numbness, and/or weakness of one or several areas of the body; allergic reactions; (i.e.: anaphylactic reaction); and/or death. Furthermore, the patient was informed of those risks and complications associated with the medications. These include, but are not limited to: allergic reactions (i.e.: anaphylactic or anaphylactoid reaction(s)); adrenal axis suppression; blood sugar elevation that in diabetics may result in ketoacidosis or comma; water retention that in patients with history of congestive heart failure may result in shortness of breath, pulmonary edema, and decompensation with resultant heart failure; weight gain; swelling or edema; medication-induced neural toxicity; particulate matter embolism and blood vessel occlusion with resultant organ, and/or nervous system infarction; and/or aseptic necrosis of one or more joints. Finally, the patient was informed that Medicine is not an exact science; therefore, there is also the possibility of unforeseen or unpredictable risks and/or possible complications that may result in a catastrophic outcome. The patient indicated having understood very clearly. We have given the patient no guarantees and we have made no promises. Enough time was given to the patient to ask questions, all  of which were answered to the patient's satisfaction. Amy [name] has indicated that she wanted to continue with the procedure. Attestation: I, the ordering provider, attest that I have discussed with the patient the benefits, risks, side-effects, alternatives, likelihood of achieving goals, and potential problems during recovery for the procedure that I have provided informed consent. Date  Time: 09/10/2023  8:24 AM  Pre-Procedure Preparation:  Monitoring: As per clinic protocol. Respiration, ETCO2, SpO2, BP, heart rate and rhythm monitor placed and checked for adequate function Safety Precautions: Patient was assessed for positional comfort and pressure points before  starting the procedure. Time-out: I initiated and conducted the "Time-out" before starting the procedure, as per protocol. The patient was asked to participate by confirming the accuracy of the "Time Out" information. Verification of the correct person, site, and procedure were performed and confirmed by me, the nursing staff, and the patient. "Time-out" conducted as per Joint Commission's Universal Protocol (UP.01.01.01). Time: 0927 Start Time: 0927 hrs.  Description/Narrative of Procedure:          Start Time: 0927 hrs. Technical description of procedure:  Rationale (medical necessity): procedure needed and proper for the diagnosis and/or treatment of the patient's medical symptoms and needs. Procedural Technique Safety Precautions: Aspiration looking for blood return was conducted prior to all injections. At no point did we inject any substances, as a needle was being advanced. No attempts were made at seeking any paresthesias. Safe injection practices and needle disposal techniques used. Medications properly checked for expiration dates. SDV (single dose vial) medications used. Target Area: For the thoracic medial branch nerve, the target is located between the top of the transverse processes at the point where the  transverse process joints the vertebra and the superior-lateral aspect of the transverse process.  (More lateral towards the superior aspect of the thoracic spine.) For safety reasons and to minimize the risk of hemo- or pneumothorax, the needle tip was not advanced past the boundary of the posterior paravertebral compartment (superior costotransverse ligament). Description of the Procedure: Protocol guidelines were followed. The patient was placed in position over the fluoroscopy table. The target area was identified and the area prepped in the usual manner. Skin & deeper tissues infiltrated with local anesthetic. Appropriate amount of time allowed to pass for local anesthetics to take effect. The procedure needles were then advanced to the target area. Proper needle placement secured. Negative aspiration confirmed. Solution injected in intermittent fashion, asking for systemic symptoms every 0.5cc of injectate. The needles were then removed and the area cleansed, making sure to leave some of the prepping solution back to take advantage of its long term bactericidal properties.             Facet Joint Innervation  T1-2 C8, T1  Medial Branch  T2-3 T1, T2         "          "  T3-4 T2, T3         "          "  T4-5 T3, T4         "          "  T5-6 T4, T5         "          "  T6-7 T5, T6         "          "  T7-8 T6, T7         "          "  T8-9 T7, T8         "          "  T9-10 T8, T9         "          "  T10-11 T9, T10         "          "  T11-12 T10, T11         "          "  T12-L1 T11, T12         "          "    Vitals:   09/10/23 0935 09/10/23 0940 09/10/23 0942 09/10/23 0952  BP: 128/89 (!) 125/94 (!) 125/94 129/72  Pulse: 87 85 86 85  Resp: 19 16 16 16   Temp:      TempSrc:      SpO2: 100% 100% 100% 97%  Weight:      Height:         End Time: 0942 hrs.  Imaging Guidance (Spinal):          [name] of Imaging Technique: Fluoroscopy Guidance (Spinal) Indication(s):  Fluoroscopy guidance for needle placement to enhance accuracy in procedures requiring precise needle localization for targeted delivery of medication in or near specific anatomical locations not easily accessible without such real-time imaging assistance. Exposure Time: Please see nurses notes. Contrast: None used. Fluoroscopic Guidance: I was personally present during the use of fluoroscopy. "Tunnel Vision Technique" used to obtain the best possible view of the target area. Parallax error corrected before commencing the procedure. "Direction-depth-direction" technique used to introduce the needle under continuous pulsed fluoroscopy. Once target was reached, antero-posterior, oblique, and lateral fluoroscopic projection used confirm needle placement in all planes. Images permanently stored in EMR. Interpretation: No contrast injected. I personally interpreted the imaging intraoperatively. Adequate needle placement confirmed in multiple planes. Permanent images saved into the patient's record.  Post-operative Assessment:  Post-procedure Vital Signs:  Pulse/HCG Rate: 85  Temp: (!) 97.2 F (36.2 C) Resp: 16 BP: 129/72 SpO2: 97 %  EBL: None  Complications: No immediate post-treatment complications observed by team, or reported by patient.  Note: The patient tolerated the entire procedure well. A repeat set of vitals were taken after the procedure and the patient was kept under observation following institutional policy, for this [name] of procedure. Post-procedural neurological assessment was performed, showing return to baseline, prior to discharge. The patient was provided with post-procedure discharge instructions, including a section on how to identify potential problems. Should any problems arise concerning this procedure, the patient was given instructions to immediately contact us, at any time, without hesitation. In any case, we plan to contact the patient by telephone for a follow-up status  report regarding this interventional procedure.  Comments:  No additional relevant information.  Plan of Care (POC)  Orders:  Orders Placed This Encounter  Procedures   THORACIC FACET BLOCK    Scheduling Instructions:     Thoracic Medial Branch Block     Side: Bilateral     Level(s): TBD     Sedation: With Sedation.     Timeframe: Today    Where will this procedure be performed?:   [facility]   DG [facility] C-ARM 1-60 MIN NO REPORT    Intraoperative interpretation by procedural physician at [facility].    Standing Status:   Standing    Number of Occurrences:   1    Reason for exam::   Assistance in needle guidance and placement for procedures requiring needle placement in or near specific anatomical locations not easily accessible without such assistance.   Informed Consent Details: Physician/Practitioner Attestation; Transcribe to consent form and obtain patient signature    Nursing Order: Transcribe to consent form and obtain patient signature. Note: Always confirm laterality of pain with Amy [name], before procedure.    Physician/Practitioner attestation of informed consent for procedure/surgical case:   I, the physician/practitioner, attest that I have  discussed with the patient the benefits, risks, side effects, alternatives, likelihood of achieving goals and potential problems during recovery for the procedure that I have provided informed consent.    Procedure:   Thoracic medial branch facet block    Physician/Practitioner performing the procedure:   Kadeen Sroka A. Laban Emperor, MD    Indication/Reason:   Thoracic upper back pain associated with thoracic facet syndrome (Z61.096) and thoracic spondylosis without myelopathy or radiculopathy (M47.814)   Provide equipment / supplies at bedside    Procedure tray: "Block Tray" (Disposable  single use) Skin infiltration needle: Regular 1.5-in, 25-G, (x1) Block Needle [name]: Spinal Amount/quantity: 4 Size:  Medium (5-inch) Gauge: 22G    Standing Status:   Standing    Number of Occurrences:   1    Specify:   Block Tray   Chronic Opioid Analgesic:   None MME/day: 0 mg/day   Medications ordered for procedure: Meds ordered this encounter  Medications   lidocaine (XYLOCAINE) 2 % (with pres) injection 400 mg   pentafluoroprop-tetrafluoroeth (GEBAUERS) aerosol   lactated ringers infusion   midazolam (VERSED) 5 MG/5ML injection 0.5-2 mg    Make sure Flumazenil is available in the pyxis when using this medication. If oversedation occurs, administer 0.2 mg IV over 15 sec. If after 45 sec no response, administer 0.2 mg again over 1 min; may repeat at 1 min intervals; not to exceed 4 doses (1 mg)   fentaNYL (SUBLIMAZE) injection 25-50 mcg    Make sure Narcan is available in the pyxis when using this medication. In the event of respiratory depression (RR< 8/min): Titrate NARCAN (naloxone) in increments of 0.1 to 0.2 mg IV at 2-3 minute intervals, until desired degree of reversal.   ropivacaine (PF) 2 mg/mL (0.2%) (NAROPIN) injection 18 mL   dexamethasone (DECADRON) injection 20 mg   Medications administered: We administered lidocaine, pentafluoroprop-tetrafluoroeth, lactated ringers, midazolam, ropivacaine (PF) 2 mg/mL (0.2%), and dexamethasone.  See the medical record for exact dosing, route, and time of administration.  Follow-up plan:   Return in about 2 weeks (around 09/24/2023) for (Face2F), (PPE).       Interventional Therapies  Risk Factors  Considerations:  Hx. suicide attempt  Lumbar Post-Laminectomy Syndrome x6            Thoracolumbar HARDWARE (NO RFA)     Planned  Pending:   Diagnostic bilateral Thoracic FCT (T9-12) MBB #2    Under consideration:   Diagnostic bilateral Thoracic FCT (T9-12) MBB #2    Completed:   Diagnostic bilateral L2-S1 lumbar facet MBB x2 (07/04/2023) (100/100/100/LBP:100)  Diagnostic bilateral T9-T12 thoracic facet MBB x1 (04/09/2023)  (100/100/>75/75) pain changed to intermittent. 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       Recent Visits Date [name] Provider Dept  08/29/23 Procedure visit Delano Metz, MD [facility]  07/18/23 Office Visit Delano Metz, MD [facility]  07/04/23 Procedure visit Delano Metz, MD [facility]  Showing recent visits within past 90 days and meeting all other requirements Today's Visits Date [name] Provider Dept  09/10/23 Procedure visit Delano Metz, MD [facility]  Showing today's visits and meeting all other requirements Future Appointments Date [name] Provider Dept  09/24/23 Appointment Delano Metz, MD [facility]  Showing future appointments within next 90 days  and meeting all other requirements  Disposition: Discharge home  Discharge (Date  Time): 09/10/2023; 0959 hrs.   Primary Care Physician: Ollen Bowl, MD Location: [facility] Note by: Oswaldo Done, MD (TTS technology used. I apologize for any typographical errors that were not detected and corrected.) Date: 09/10/2023; Time: 10:15 AM  Disclaimer:  Medicine is not an Visual merchandiser. The only guarantee in medicine is that nothing is guaranteed. It is important to note that the decision to proceed with this intervention was based on the information collected from the patient. The Data and conclusions were drawn from the patient's questionnaire, the interview, and the physical examination. Because the information was provided in large part by the patient, it cannot be guaranteed that it has not been purposely or unconsciously manipulated. Every effort has been made to obtain as much  relevant data as possible for this evaluation. It is important to note that the conclusions that lead to this procedure are derived in large part from the available data. Always take into account that the treatment will also be dependent on availability of resources and existing treatment guidelines, considered by other Pain Management Practitioners as being common knowledge and practice, at the time of the intervention. For Medico-Legal purposes, it is also important to point out that variation in procedural techniques and pharmacological choices are the acceptable norm. The indications, contraindications, technique, and results of the above procedure should only be interpreted and judged by a Board-Certified Interventional Pain Specialist with extensive familiarity and expertise in the same exact procedure and technique.

## 2023-09-11 ENCOUNTER — Telehealth: Payer: Self-pay | Admitting: *Deleted

## 2023-09-11 ENCOUNTER — Other Ambulatory Visit: Payer: Self-pay | Admitting: *Deleted

## 2023-09-11 NOTE — Telephone Encounter (Signed)
No problems post procedure. 

## 2023-09-24 ENCOUNTER — Ambulatory Visit: Payer: Medicare Other | Attending: Pain Medicine | Admitting: Pain Medicine

## 2023-09-24 ENCOUNTER — Encounter: Payer: Self-pay | Admitting: Pain Medicine

## 2023-09-24 VITALS — BP 153/75 | HR 85 | Temp 97.2°F | Resp 16 | Ht 65.0 in | Wt 268.0 lb

## 2023-09-24 DIAGNOSIS — M542 Cervicalgia: Secondary | ICD-10-CM | POA: Insufficient documentation

## 2023-09-24 DIAGNOSIS — E66813 Obesity, class 3: Secondary | ICD-10-CM

## 2023-09-24 DIAGNOSIS — M4325 Fusion of spine, thoracolumbar region: Secondary | ICD-10-CM | POA: Diagnosis not present

## 2023-09-24 DIAGNOSIS — M25561 Pain in right knee: Secondary | ICD-10-CM | POA: Insufficient documentation

## 2023-09-24 DIAGNOSIS — G44309 Post-traumatic headache, unspecified, not intractable: Secondary | ICD-10-CM | POA: Insufficient documentation

## 2023-09-24 DIAGNOSIS — X58XXXA Exposure to other specified factors, initial encounter: Secondary | ICD-10-CM | POA: Insufficient documentation

## 2023-09-24 DIAGNOSIS — Z9181 History of falling: Secondary | ICD-10-CM | POA: Insufficient documentation

## 2023-09-24 DIAGNOSIS — W19XXXD Unspecified fall, subsequent encounter: Secondary | ICD-10-CM

## 2023-09-24 DIAGNOSIS — R296 Repeated falls: Secondary | ICD-10-CM

## 2023-09-24 DIAGNOSIS — Z09 Encounter for follow-up examination after completed treatment for conditions other than malignant neoplasm: Secondary | ICD-10-CM

## 2023-09-24 DIAGNOSIS — M25512 Pain in left shoulder: Secondary | ICD-10-CM | POA: Diagnosis not present

## 2023-09-24 DIAGNOSIS — M961 Postlaminectomy syndrome, not elsewhere classified: Secondary | ICD-10-CM

## 2023-09-24 DIAGNOSIS — C7951 Secondary malignant neoplasm of bone: Secondary | ICD-10-CM | POA: Diagnosis not present

## 2023-09-24 DIAGNOSIS — M47894 Other spondylosis, thoracic region: Secondary | ICD-10-CM

## 2023-09-24 DIAGNOSIS — M170 Bilateral primary osteoarthritis of knee: Secondary | ICD-10-CM | POA: Insufficient documentation

## 2023-09-24 DIAGNOSIS — R29898 Other symptoms and signs involving the musculoskeletal system: Secondary | ICD-10-CM | POA: Insufficient documentation

## 2023-09-24 DIAGNOSIS — M51379 Other intervertebral disc degeneration, lumbosacral region without mention of lumbar back pain or lower extremity pain: Secondary | ICD-10-CM | POA: Diagnosis not present

## 2023-09-24 DIAGNOSIS — M545 Low back pain, unspecified: Secondary | ICD-10-CM | POA: Diagnosis not present

## 2023-09-24 DIAGNOSIS — S46012A Strain of muscle(s) and tendon(s) of the rotator cuff of left shoulder, initial encounter: Secondary | ICD-10-CM | POA: Diagnosis not present

## 2023-09-24 DIAGNOSIS — M25511 Pain in right shoulder: Secondary | ICD-10-CM | POA: Insufficient documentation

## 2023-09-24 DIAGNOSIS — M47816 Spondylosis without myelopathy or radiculopathy, lumbar region: Secondary | ICD-10-CM | POA: Diagnosis not present

## 2023-09-24 DIAGNOSIS — Z6841 Body Mass Index (BMI) 40.0 and over, adult: Secondary | ICD-10-CM | POA: Diagnosis not present

## 2023-09-24 DIAGNOSIS — M48061 Spinal stenosis, lumbar region without neurogenic claudication: Secondary | ICD-10-CM | POA: Diagnosis not present

## 2023-09-24 DIAGNOSIS — M25562 Pain in left knee: Secondary | ICD-10-CM | POA: Diagnosis not present

## 2023-09-24 DIAGNOSIS — G8929 Other chronic pain: Secondary | ICD-10-CM

## 2023-09-24 DIAGNOSIS — M546 Pain in thoracic spine: Secondary | ICD-10-CM

## 2023-09-24 DIAGNOSIS — M533 Sacrococcygeal disorders, not elsewhere classified: Secondary | ICD-10-CM | POA: Diagnosis not present

## 2023-09-24 NOTE — Patient Instructions (Addendum)
 Pain Management Discharge Instructions  General Discharge Instructions :  If you need to reach your doctor call: Monday-Friday 8:00 am - 4:00 pm at [phone] or toll free [phone].  After clinic hours [phone] to have operator reach doctor.  Bring all of your medication bottles to all your appointments in the pain clinic.  To cancel or reschedule your appointment with Pain Management please remember to call 24 hours in advance to avoid a fee.  Refer to the educational materials which you have been given on: General Risks, I had my Procedure. Discharge Instructions, Post Sedation.  Post Procedure Instructions:  The drugs you were given will stay in your system until tomorrow, so for the next 24 hours you should not drive, make any legal decisions or drink any alcoholic beverages.  You may eat anything you prefer, but it is better to start with liquids then soups and crackers, and gradually work up to solid foods.  Please notify your doctor immediately if you have any unusual bleeding, trouble breathing or pain that is not related to your normal pain.  Depending on the type of procedure that was done, some parts of your body may feel week and/or numb.  This usually clears up by tonight or the next day.  Walk with the use of an assistive device or accompanied by an adult for the 24 hours.  You may use ice on the affected area for the first 24 hours.  Put ice in a Ziploc bag and cover with a towel and place against area 15 minutes on 15 minutes off.  You may switch to heat after 24 hours.   ______________________________________________________________________    Patient information on: Body mass index (BMI) and Weight Management  Dear Amy [name] you are receiving this information because your weight may be adversely affecting your health.   Your current Estimated body mass index is 44.6 kg/m as calculated from the following:   Height as of this encounter: 5' 5  (1.651 m).   Weight as of this encounter: 268 lb (121.6 kg).  We recommend you talk to your primary care physician about providing or referring you to a supervised weight management program.  Here is some information about weight and the body mass index (BMI) classification:  BMI is a measure of obesity that's calculated by dividing a person's weight in kilograms by their height in meters squared. A person can use an online calculator to determine their BMI. Body mass index (BMI) is a common tool for deciding whether a person has an appropriate body weight.  It measures a person's weight in relation to their height.  According to the [facility] of health (NIH): A BMI of less than 18.5 means that a person is underweight. A BMI of between 18.5 and 24.9 is ideal. A BMI of between 25 and 29.9 is overweight. A BMI over 30 indicates obesity.  Body Mass Index (BMI) Classification BMI level (kg/m2) Category Associated incidence of chronic pain  <18  Underweight   18.5-24.9 Ideal body weight   25-29.9 Overweight  20%  30-34.9 Obese (Class I)  68%  35-39.9 Severe obesity (Class II)  136%  >40 Extreme obesity (Class III)  254%    Morbidly Obese Classification: You will be considered to be Morbidly Obese if your BMI is above 30 and you have one or more of the following conditions caused or associated to obesity: 1.    Type 2 Diabetes (Leading to cardiovascular diseases (CVD), stroke, peripheral vascular diseases (PVD),  retinopathy, nephropathy, and neuropathy) 2.    Cardiovascular Disease (High Blood Pressure; Congestive Heart Failure; High Cholesterol; Coronary Artery Disease; Angina; Arrhythmias, Dysrhythmias, or Heart Attacks) 3.    Breathing problems (Asthma; obesity-hypoventilation syndrome; obstructive sleep apnea; chronic inflammatory airway disease; reactive airway disease; or shortness of breath) 4.    Chronic kidney disease 5.    Liver disease (nonalcoholic fatty liver  disease) 6.    High blood pressure 7.    Acid reflux (gastroesophageal reflux disease; heartburn) 8.    Osteoarthritis (OA) (affecting the hip(s), the knee(s) and/or the lower back) (usually requiring knee and/or hip replacements, as well as back surgeries) 9.    Low back pain (Lumbar Facet Syndrome; and/or Degenerative Disc Disease) 10.  Hip pain (Osteoarthritis of hip) (For every 1 lbs of added body weight, there is a 2 lbs increase in pressure inside of each hip articulation. 1:2 mechanical relationship) 11.  Knee pain (Osteoarthritis of knee) (For every 1 lbs of added body weight, there is a 4 lbs increase in pressure inside of each knee articulation. 1:4 mechanical relationship) (patients with a BMI>30 kg/m2 were 6.8 times more likely to develop knee OA than normal-weight individuals) 12.  Cancer: Epidemiological studies have shown that obesity is a risk factor for: post-menopausal breast cancer; cancers of the endometrium, colon and kidney cancer; malignant adenomas of the esophagus. Obese subjects have an approximately 1.5-3.5-fold increased risk of developing these cancers compared with normal-weight subjects, and it has been estimated that between 15 and 45% of these cancers can be attributed to overweight. More recent studies suggest that obesity may also increase the risk of other types of cancer, including pancreatic, hepatic and gallbladder cancer. (Ref: Obesity and cancer. Pischon T, Nthlings U, [employer] H. Proc Nutr Soc. 2008 May;67(2):128-45. doi: 10.1017/S0029665108006976.) The International Agency for Research on Cancer (IARC) has identified 13 cancers associated with overweight and obesity: meningioma, multiple myeloma, adenocarcinoma of the esophagus, and cancers of the thyroid , postmenopausal breast cancer, gallbladder, stomach, liver, pancreas, kidney, ovaries, uterus, colon and rectal (colorectal) cancers. 55 percent of all cancers diagnosed in women and 24 percent of those diagnosed in  men are associated with overweight and obesity.  Recommendation: If you have any of the above conditions it is urgent that you take a step back and concentrate in losing weight. Dedicate 100% of your efforts on this task. Nothing else will improve your health more than bringing your weight down and your BMI to less than 30.   Nutritionist and/or supervised weight-management program: We are aware that most chronic pain patients are unable to exercise secondary to their pain. For this reason, you must rely on proper nutrition and diet in order to lose the weight. We recommend you talk to a nutritionist.   Bariatric surgery: A person might be considered a candidate for bariatric surgery if they meet one of the following BMI criteria:  BMI of 40 or higher: This is considered extreme obesity (Class III). BMI of 35-39.9: This is considered obesity, and the person might also have a serious weight-related health condition, such as high blood pressure, type 2 diabetes, or severe sleep apnea  BMI of 30-34.9: This might be considered if the person has serious weight-related health problems and hasn't had substantial weight loss or improvement in co-morbidities through other methods   On your own: A realistic goal is to lose 10% of your body weight over a period of 12 months.  If over a period of six (6) months you have  unsuccessfully tried to lose weight, then it is time for you to seek professional help and to enter a medically supervised weight management program, and/or undergo bariatric surgery.   Pain management considerations and possible limitations:  1.    Pharmacological Problems: Be advised that the use of opioid analgesics (oxycodone ; hydrocodone ; morphine ; methadone; codeine; and all of their derivatives) have been associated with decreased metabolism and weight gain.  For this reason, should we see that you are unable to lose weight while taking these medications, it may become necessary for us  to  taper down and indefinitely discontinue them.  2.    Technical Problems: The incidence of successful interventional therapies decreases as the patient's BMI increases. It is much more difficult to accomplish a safe and effective interventional therapy on a patient with a BMI above 35. 3.    Radiation Exposure Problems: The x-rays machine, used to accomplish injection therapies, will automatically increase their x-ray output in order to capture an appropriate bone image. This means that radiation exposure increases exponentially with the patient's BMI. (The higher the BMI, the higher the radiation exposure.) Although the level of radiation used at a given time is still safe to the patient, it is not for the physician and/or assisting staff. Unfortunately, radiation exposure is accumulative. Because physicians and the staff have to do procedures and be exposed on a daily basis, this can result in health problems such as cancer and radiation burns. Radiation exposure to the staff is monitored by the radiation batches that they wear. The exposure levels are reported back to the staff on a quarterly basis. Depending on levels of exposure, physicians and staff may be obligated by law to decrease this exposure. This means that they have the right and obligation to refuse providing therapies where they may be overexposed to radiation. For this reason, physicians may decline to offer therapies such as radiofrequency ablation or implants to patients with a BMI above 40. 4.    Current Trends: Be advised that the current trend is to no longer offer certain therapies to patients with a BMI equal to, or above 35, due to increase perioperative risks, increased technical procedural difficulties, and excessive radiation exposure to healthcare personnel.  Last updated: 06/17/2023 ______________________________________________________________________

## 2023-09-24 NOTE — Progress Notes (Signed)
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 results and will provide      imaging availability to the requesting physician. In addition inform the patient that the imaging order has an expiration date and will not be renewed if not done within the active period.    Reason for Exam (SYMPTOM  OR DIAGNOSIS REQUIRED):   Upper/thoracic back pain    Preferred imaging location?:   [facility]    Release to patient:   Immediate    Call Results- Best Contact Number?:   [phone] [facility] at [facility]   DG Shoulder Right    Please make sure that the patient understands that this needs to be done as soon as possible. Never have the patient do the imaging just before the  next appointment. Inform patient that having the imaging done within the [facility] will expedite the availability  of the results and will provide imaging availability to the requesting physician. In addition inform the patient that the imaging order has an expiration date and will not be renewed if not done within the active period.    Standing Status:   Future    Expiration Date:   12/23/2023    Scheduling Instructions:     Imaging must be done as soon as possible. Inform patient that order will expire within 30 days and I will not renew it.    Reason for Exam (SYMPTOM  OR DIAGNOSIS REQUIRED):   Right shoulder pain    Preferred imaging location?:   [facility]    Call Results- Best Contact Number?:   [phone] [facility] at [facility]    Release to patient:   Immediate   DG Shoulder Left    Please make sure that the patient understands that this needs to be done as soon as possible. Never have the patient do the imaging just before the next appointment. Inform patient that having the imaging done within the [facility] will expedite the availability of the results and will provide imaging availability to the requesting physician. In addition inform the patient that the imaging order has an expiration date and will not be renewed if not done within the active period.    Standing Status:   Future    Expiration Date:   12/23/2023    Scheduling Instructions:     Imaging must be done as soon as possible. Inform patient that order will expire within 30 days and I will not renew it.    Reason for Exam (SYMPTOM  OR DIAGNOSIS REQUIRED):   Left shoulder pain    Preferred imaging location?:   [facility]    Call Results- Best Contact Number?:   [phone] [facility]    Release to patient:   Immediate   Nursing Instructions:    Please complete this patient's postprocedure evaluation.    Scheduling Instructions:     Please complete this patient's postprocedure evaluation.   NCV with EMG(electromyography)     Indication(s): Bilateral lower extremity weakness with multiple falls.    Standing Status:   Future    Expiration Date:   12/23/2023    Scheduling Instructions:     Please refer this patient to [facility] for Nerve Conduction testing of the lower extremities. (EMG & PNCV)    Where should this test be performed?:   other    Location on body:   Lower extremity    Laterality:   Bilateral             Always perform bilateral testing for comparison purposes.    Clinical Indication:   Bilateral lower extremity weakness with multiple falls    Release to patient:   Immediate   Follow-up plan:   Return in about 2 weeks (around 10/08/2023) for Eval-day (M,W), (F2F), for review of ordered tests.      Interventional Therapies  Risk Factors  Considerations:  Hx. suicide attempt  Lumbar Post-Laminectomy Syndrome x6            Thoracolumbar HARDWARE (NO RFA)     Planned  Pending:   Diagnostic bilateral Thoracic FCT (T9-12) MBB #2    Under consideration:   Diagnostic bilateral Thoracic FCT (T9-12) MBB #2  Completed:   Diagnostic bilateral L2-S1 lumbar facet MBB x2 (07/04/2023) (100/100/100/LBP:100)  Diagnostic bilateral T9-T12 thoracic facet MBB x2 (04/09/2023) (100/100/>75/75) (Do not repeat.) Diagnostic bilateral SI joint BLK x1 (03/12/2023) (100/100/80 x 2 days/40) (improvement in PSIS area)   Therapeutic  Palliative (PRN) options:   None established   Completed by other providers:   Therapeutic Nerve Blocks by [facility].      Recent Visits Date Type Provider Dept  09/10/23 Procedure visit Tanya Glisson, MD [facility]  08/29/23 Procedure visit Tanya Glisson, MD [facility]  07/18/23 Office Visit Tanya Glisson, MD [facility]  07/04/23 Procedure visit Tanya Glisson, MD [facility]  Showing recent visits within past 90 days and meeting all other requirements Today's  Visits Date Type Provider Dept  09/24/23 Office Visit Tanya Glisson, MD [facility]  Showing today's visits and meeting all other requirements Future Appointments Date Type Provider Dept  10/09/23 Appointment Tanya Glisson, MD [facility]  Showing future appointments within next 90 days and meeting all other requirements  I discussed the assessment and treatment plan with the patient. The patient was provided an opportunity to ask questions and all were answered. The patient agreed with the plan and demonstrated an understanding of the instructions.  Patient advised to call back or seek an in-person evaluation if the symptoms or condition worsens.  Duration of encounter: 30 minutes.  Total time on encounter, as per AMA guidelines included both the face-to-face and non-face-to-face time personally spent by the physician and/or other qualified health care professional(s) on the day of the encounter (includes time in activities that require the physician or other qualified health care professional and does not include time in activities normally performed by clinical staff). Physician's time may include the following activities when performed: Preparing to see the patient (e.g., pre-charting review of records, searching for previously ordered imaging, lab work, and nerve conduction tests) Review of prior analgesic pharmacotherapies. Reviewing PMP Interpreting ordered tests (e.g., lab work, imaging, nerve conduction tests) Performing post-procedure evaluations, including interpretation of diagnostic procedures Obtaining and/or reviewing separately obtained history Performing a medically appropriate examination and/or evaluation Counseling and educating the patient/family/caregiver Ordering medications, tests, or procedures Referring and communicating with other health care professionals (when not separately reported) Documenting clinical information in the  electronic or other health record Independently interpreting results (not separately reported) and communicating results to the patient/ family/caregiver Care coordination (not separately reported)  Note by: Glisson DELENA Tanya, MD Date: 09/24/2023; Time: 11:12 AM

## 2023-09-24 NOTE — Progress Notes (Signed)
 Talked to patient about going to ED if pain or headache did not improve, Patient states she did not take BP med today. Instructed to take med when she gets home. Patient with understanding.

## 2023-09-27 ENCOUNTER — Emergency Department: Payer: Medicare Other

## 2023-09-27 ENCOUNTER — Ambulatory Visit
Admission: RE | Admit: 2023-09-27 | Discharge: 2023-09-27 | Disposition: A | Discharge status: Home / Self Care | Payer: Medicare Other | Source: Ambulatory Visit | Attending: Pain Medicine | Admitting: Pain Medicine

## 2023-09-27 ENCOUNTER — Emergency Department
Admission: EM | Admit: 2023-09-27 | Discharge: 2023-09-27 | Disposition: A | Discharge status: Home / Self Care | Payer: Medicare Other | Attending: Emergency Medicine | Admitting: Emergency Medicine

## 2023-09-27 ENCOUNTER — Encounter: Payer: Self-pay | Admitting: Intensive Care

## 2023-09-27 ENCOUNTER — Other Ambulatory Visit: Payer: Self-pay

## 2023-09-27 DIAGNOSIS — S0990XA Unspecified injury of head, initial encounter: Secondary | ICD-10-CM | POA: Diagnosis not present

## 2023-09-27 DIAGNOSIS — G8929 Other chronic pain: Secondary | ICD-10-CM | POA: Insufficient documentation

## 2023-09-27 DIAGNOSIS — M549 Dorsalgia, unspecified: Secondary | ICD-10-CM | POA: Insufficient documentation

## 2023-09-27 DIAGNOSIS — M545 Low back pain, unspecified: Secondary | ICD-10-CM | POA: Insufficient documentation

## 2023-09-27 DIAGNOSIS — M25512 Pain in left shoulder: Secondary | ICD-10-CM | POA: Insufficient documentation

## 2023-09-27 DIAGNOSIS — M546 Pain in thoracic spine: Secondary | ICD-10-CM

## 2023-09-27 DIAGNOSIS — W19XXXA Unspecified fall, initial encounter: Secondary | ICD-10-CM | POA: Insufficient documentation

## 2023-09-27 DIAGNOSIS — M47894 Other spondylosis, thoracic region: Secondary | ICD-10-CM | POA: Insufficient documentation

## 2023-09-27 DIAGNOSIS — W07XXXA Fall from chair, initial encounter: Secondary | ICD-10-CM | POA: Insufficient documentation

## 2023-09-27 DIAGNOSIS — R296 Repeated falls: Secondary | ICD-10-CM | POA: Insufficient documentation

## 2023-09-27 DIAGNOSIS — M961 Postlaminectomy syndrome, not elsewhere classified: Secondary | ICD-10-CM

## 2023-09-27 DIAGNOSIS — W19XXXD Unspecified fall, subsequent encounter: Secondary | ICD-10-CM

## 2023-09-27 DIAGNOSIS — R519 Headache, unspecified: Secondary | ICD-10-CM | POA: Diagnosis present

## 2023-09-27 DIAGNOSIS — M4325 Fusion of spine, thoracolumbar region: Secondary | ICD-10-CM

## 2023-09-27 DIAGNOSIS — M25562 Pain in left knee: Secondary | ICD-10-CM | POA: Insufficient documentation

## 2023-09-27 DIAGNOSIS — Z9181 History of falling: Secondary | ICD-10-CM | POA: Insufficient documentation

## 2023-09-27 DIAGNOSIS — M25511 Pain in right shoulder: Secondary | ICD-10-CM | POA: Insufficient documentation

## 2023-09-27 DIAGNOSIS — M25561 Pain in right knee: Secondary | ICD-10-CM | POA: Insufficient documentation

## 2023-09-27 MED ORDER — ACETAMINOPHEN 325 MG PO TABS
650.0000 mg | ORAL_TABLET | Freq: Once | ORAL | Status: AC
  Administered 2023-09-27: 650 mg via ORAL
  Filled 2023-09-27: qty 2

## 2023-09-27 NOTE — Discharge Instructions (Signed)
Your CT scans were normal.  Please follow-up with your outpatient provider.  Please return for any new, worsening, or change in symptoms or other concerns.  It was a pleasure caring for you today.

## 2023-09-27 NOTE — ED Provider Notes (Signed)
 [facility] Provider Note    Event Date/Time   First MD Initiated Contact with Patient 09/27/23 1503     (approximate)   History   Fall   HPI  Amy [name] is a 78 y.o. female who presents today for evaluation of headache after a fall.  Patient reports that she lost her balance while getting out of a chair and fell backwards, striking her head.  She denies loss of consciousness.  She is unable to ambulate since the event.  She has not had any nausea or vomiting.  No visual changes.  She denies any weakness or paresthesias different from her baseline.  She is not anticoagulated.  Patient Active Problem List   Diagnosis Date Noted   Falls frequently 09/24/2023   At high risk for falls 09/24/2023   Weakness of both legs (L>R) 09/24/2023   Intermittent left-sided chest pain 05/27/2023   Pain from implanted hardware (lumbar fusion) 05/20/2023   Lumbar facet joint pain 04/23/2023   Spondylosis without myelopathy or radiculopathy, lumbosacral region 04/23/2023   Fall 04/17/2023   Postlaminectomy syndrome, thoracic 04/09/2023   History of fusion of thoracic spine 04/09/2023   Chronic upper back pain 04/04/2023   Thoracic spine pain 04/04/2023   Thoracic facet joint pain 04/04/2023   Thoracic facet syndrome 04/04/2023   Spondylosis without myelopathy or radiculopathy, thoracic region 04/04/2023   Chronic hip pain (Left) 04/04/2023   Chronic sacroiliac joint pain (Bilateral) 03/12/2023   Chronic sacroiliitis (HCC) (Bilateral) 02/25/2023   Fusion of spine, thoracolumbar region (T10-L3) 02/25/2023   Sacroiliac joint disease (Bilateral) 01/28/2023   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   Thoracic radiculopathy open (T5/6) (Bilateral) 01/28/2023   Thyroid  adenoma 01/28/2023   Peripheral vascular disease (HCC) 01/28/2023   Pharmacologic therapy 01/28/2023   Disorder of skeletal system 01/28/2023   Problems influencing  health status 01/28/2023   Elevated sed rate 01/28/2023   Thoracic foraminal stenosis (Bilateral) 01/28/2023   Abnormal CT of thoracic spine (06/25/2022) 01/28/2023   Abnormal MRI, thoracic spine (10/24/2021) 01/28/2023   Abnormal CT scan, lumbar spine (09/01/2021) 01/28/2023   Abnormal MRI, lumbar spine (04/13/2021) 01/28/2023   Concussion without loss of consciousness 09/15/2019   Arthropathy 07/02/2019   Chronic thoracic back pain (2ry area of Pain) (Bilateral) (R>L) 05/05/2019   Uncontrolled daytime somnolence 04/12/2019   Snoring 01/22/2019   Obesity, Class III, BMI 40-49.9 (morbid obesity) (HCC) 01/22/2019   OSA on CPAP NOT COMPLIANT 01/22/2019   Right lower quadrant pain 07/22/2018   Wound dehiscence 07/22/2016   Dyspnea 07/21/2016   Acute encephalopathy 07/21/2016   Anemia 07/21/2016   Pancreatitis 07/02/2016   SBO (small bowel obstruction) (HCC) 07/02/2016   Long term current use of opiate analgesic 06/26/2016   Long term prescription opiate use 06/26/2016   Opiate use 06/26/2016   Encounter for therapeutic drug level monitoring 06/26/2016   Encounter for pain management planning 06/26/2016   Chronic pain syndrome 06/26/2016   Disturbance of skin sensation 06/26/2016   Chronic low back pain (1ry area of Pain) (Bilateral) (R>L) w/o sciatica 06/26/2016   Chronic hip pain (3ry area of Pain) (Bilateral) (R>L) 06/26/2016   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) 06/26/2016   Chronic shoulder pain (Bilateral) (R>L) 06/26/2016   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) 06/26/2016   Chronic knee pain (Bilateral) (R>L) 06/26/2016   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) 06/26/2016   Chronic neck pain (Right) 06/26/2016   Lumbar spondylosis 06/26/2016   Lumbar facet  syndrome 06/26/2016   Lumbar facet hypertrophy 06/26/2016   Epidural fibrosis 06/26/2016   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   Neurogenic pain 06/26/2016   Occipital headaches 06/26/2016   Failed back  surgical syndrome (6) 06/26/2016   History of lumbar spinal fusion 06/26/2016   Neuropathic pain of both legs 10/13/2015   Constipation 10/13/2015   Displacement of lumbar intervertebral disc without myelopathy 07/05/2015   DDD (degenerative disc disease), lumbosacral 07/05/2015   Complete rotator cuff tear of left shoulder 05/05/2014   Trochanteric bursitis 03/30/2014   Pseudoarthrosis of lumbar spine 03/18/2014   Lumbar post-laminectomy syndrome 03/18/2014   H/O nonunion of fracture 09/03/2013   Depression 08/06/2013   Hypokalemia 08/06/2013   GERD (gastroesophageal reflux disease) 08/06/2013   Spinal stenosis of lumbar region 08/06/2013   Bladder spasm 08/06/2013   Morbid obesity (HCC) 02/03/2013   Lipoma of buttock s/p excision 02/20/2013 02/03/2013   DYSPNEA 01/23/2008   Obstructive sleep apnea 09/12/2007   HTN (hypertension) 09/12/2007   Allergic rhinitis 09/12/2007   SLEEPINESS 09/12/2007   History of cardiovascular disorder 09/12/2007          Physical Exam   Triage Vital Signs: ED Triage Vitals  Encounter Vitals Group     BP 09/27/23 1250 (!) 149/76     Systolic BP Percentile --      Diastolic BP Percentile --      Pulse Rate 09/27/23 1250 83     Resp 09/27/23 1250 16     Temp 09/27/23 1250 98.1 F (36.7 C)     Temp Source 09/27/23 1250 Oral     SpO2 09/27/23 1250 95 %     Weight 09/27/23 1247 264 lb (119.7 kg)     Height 09/27/23 1247 5' 5 (1.651 m)     Head Circumference --      Peak Flow --      Pain Score 09/27/23 1247 8     Pain Loc --      Pain Education --      Exclude from Growth Chart --     Most recent vital signs: Vitals:   09/27/23 1250  BP: (!) 149/76  Pulse: 83  Resp: 16  Temp: 98.1 F (36.7 C)  SpO2: 95%    Physical Exam Vitals and nursing note reviewed.  Constitutional:      General: Awake and alert. No acute distress.    Appearance: Normal appearance. The patient is normal weight.  HENT:     Head: Normocephalic and  atraumatic.     Mouth: Mucous membranes are moist.  Eyes:     General: PERRL. Normal EOMs        Right eye: No discharge.        Left eye: No discharge.     Conjunctiva/sclera: Conjunctivae normal.  Cardiovascular:     Rate and Rhythm: Normal rate and regular rhythm.     Pulses: Normal pulses.  Pulmonary:     Effort: Pulmonary effort is normal. No respiratory distress.     Breath sounds: Normal breath sounds.  Abdominal:     Abdomen is soft. There is no abdominal tenderness. No rebound or guarding. No distention. Musculoskeletal:        General: No swelling. Normal range of motion.     Cervical back: Normal range of motion and neck supple. No midline cervical spine tenderness.  Full range of motion of neck.  Negative Spurling test.  Negative Lhermitte sign.  Normal strength and sensation in bilateral upper extremities. Normal  grip strength bilaterally.  Normal intrinsic muscle function of the hand bilaterally.  Normal radial pulses bilaterally. Skin:    General: Skin is warm and dry.     Capillary Refill: Capillary refill takes less than 2 seconds.     Findings: No rash.  Neurological:     Mental Status: The patient is awake and alert.   Neurological: GCS 15 alert and oriented x3 Normal speech, no expressive or receptive aphasia or dysarthria Cranial nerves II through XII intact Normal visual fields 5 out of 5 strength in all 4 extremities with intact sensation throughout No extremity drift Normal finger-to-nose testing, no limb or truncal ataxia    ED Results / Procedures / Treatments   Labs (all labs ordered are listed, but only abnormal results are displayed) Labs Reviewed - No data to display   EKG     RADIOLOGY I independently reviewed and interpreted imaging and agree with radiologists findings.     PROCEDURES:  Critical Care performed:   Procedures   MEDICATIONS ORDERED IN ED: Medications  acetaminophen  (TYLENOL ) tablet 650 mg (650 mg Oral Given  09/27/23 1534)     IMPRESSION / MDM / ASSESSMENT AND PLAN / ED COURSE  I reviewed the triage vital signs and the nursing notes.   Differential diagnosis includes, but is not limited to, contusion, concussion, intracranial hemorrhage, cervical spine injury.  Patient is awake and alert, hemodynamically stable and afebrile.  She is neurologically intact.  She has no midline cervical spine tenderness.  CT head and neck obtained per [place] criteria are normal.  Patient is reassured by these findings.  She has full normal range of motion of her extremities, she is able to ambulate unassisted, do not suspect any other injuries, she denies any pain elsewhere.  She is not anticoagulated.  Low risk for delayed head bleed, though this was discussed with the patient as well.  We discussed return precautions and outpatient follow-up.  She was treated symptomatically with Tylenol .  Patient understands and agrees with plan.  She was discharged in stable condition.   Patient's presentation is most consistent with acute complicated illness / injury requiring diagnostic workup.    FINAL CLINICAL IMPRESSION(S) / ED DIAGNOSES   Final diagnoses:  Injury of head, initial encounter  Fall, initial encounter     Rx / DC Orders   ED Discharge Orders     None        Note:  This document was prepared using Dragon voice recognition software and may include unintentional dictation errors.   Tykeshia Tourangeau E, PA-C 09/27/23 1654    Suzanne Kirsch, MD 09/28/23 [phone]

## 2023-09-27 NOTE — ED Triage Notes (Signed)
 Pt c/o fall last Thursday and hitting back and head. Reports intermittent headaches and pain all over back. Drove self to ER  Xrays [facility] for [facility].

## 2023-10-08 NOTE — Progress Notes (Signed)
 PROVIDER NOTE: Information contained herein reflects review and annotations entered in association with encounter. Interpretation of such information and data should be left to medically-trained personnel. Information provided to patient can be located elsewhere in the medical record under "Patient Instructions". Document created using STT-dictation technology, any transcriptional errors that may result from process are unintentional.    Patient: Amy [name]  Service Category: E/M  Provider: Candi Chafe, MD  DOB: 1946/02/15  DOS: 10/09/2023  Referring Provider: Elester Grim, MD  MRN: [id number]  Specialty: Interventional Pain Management  PCP: Elester Grim, MD  Type: Established Patient  Setting: Ambulatory outpatient    Location: Office  Delivery: Face-to-face     HPI  Amy [name], a 78 y.o. year old female, is here today because of her Facet hypertrophy of cervical region [M47.812]. [name] primary complain today is Back Pain  Pertinent problems: 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); Chronic neck pain (Right); Lumbar spondylosis; Lumbar facet syndrome; Lumbar facet hypertrophy; Epidural fibrosis; Epidural lipomatosis; Neurogenic pain; Occipital headaches; Failed back surgical syndrome (6); History of lumbar spinal fusion; H/O nonunion of fracture; Trochanteric bursitis; Lumbar post-laminectomy syndrome; Sacroiliac joint disease  (Bilateral); Secondary malignant neoplasm of vertebral column (HCC); Thoracic radiculopathy open (T5/6) (Bilateral); Arthropathy; Displacement of lumbar intervertebral disc without myelopathy; DDD (degenerative disc disease), lumbosacral; Chronic thoracic back pain (2ry area of Pain) (Bilateral) (R>L); Thoracic foraminal stenosis (Bilateral); Abnormal CT of thoracic spine (06/25/2022); Abnormal MRI, thoracic spine (10/24/2021); Abnormal CT scan, lumbar spine (09/01/2021); Abnormal MRI, lumbar spine (04/13/2021); Chronic sacroiliitis (HCC) (Bilateral); Fusion of spine, thoracolumbar region (T10-L3); Chronic sacroiliac joint pain (Bilateral); Chronic upper back pain; Thoracic spine pain; Thoracic facet joint pain; Thoracic facet syndrome; Spondylosis without myelopathy or radiculopathy, thoracic region; Chronic hip pain (Left); Postlaminectomy syndrome, thoracic; History of fusion of thoracic spine; Lumbar facet joint pain; Spondylosis without myelopathy or radiculopathy, lumbosacral region; Pain from implanted hardware (lumbar fusion); Weakness of both legs (L>R); Cervical facet hypertrophy; Osteoarthritis of knee (Left); Tricompartment osteoarthritis of knee (Left); Osteoarthritis of shoulder (Left); Osteoarthritis of AC (acromioclavicular) joint (Left); Osteoarthritis of glenohumeral joint (Left); Osteoarthritis of AC (acromioclavicular) joint (Right); Osteoarthritis of acromioclavicular joints (Bilateral); Osteoarthritis of glenohumeral joint (Right); Osteoarthritis of glenohumeral joints (Bilateral); and Loosening of hardware in spine (HCC) (L2 pedicle screw) on their pertinent problem list. Pain Assessment: Severity of Chronic pain is reported as a 7 /10. Location: Back Right, Left, Mid/radiates down back in hips/buttocks bilateral. Onset: More than a month ago. Quality: Aching, Constant, Sore, Nagging, Discomfort, Guarding, Stabbing. Timing: Constant. Modifying factor(s): rest, Tylenol . Vitals:  height is 5'  5" (1.651 m) and weight is 264 lb (119.7 kg). Her temperature is 97.2 F (36.2 C) (abnormal). Her blood pressure is 153/81 (abnormal) and her pulse is 92. Her respiration is 16 and oxygen saturation is 97%.  BMI: Estimated body mass index is 43.93 kg/m as calculated from the following:   Height as of this encounter: 5\' 5"  (1.651 m).   Weight as of this encounter: 264 lb (119.7 kg). Last encounter: 09/24/2023. Last procedure: 09/10/2023.  Reason for encounter: follow-up evaluation following diagnostic imaging studies ordered on 09/24/2023.  Diagnostic x-rays of the cervical spine with bending views demonstrate no acute fracture or subluxation of the cervical spine.  There is scattered facet joint hypertrophy most prominently affecting the C6-7 on the right side.  Diagnostic x-rays of the left knee show no acute fracture or subluxation of the left knee.  Mild tricompartmental osteoarthritis with chondrocalcinosis.  Diagnostic x-rays of the lumbar spine with bending views show multilevel postsurgical changes.  T12-L3 posterior fusion hardware.  Increasing lucency about the L2 pedicle screws measuring up to 4 mm, suspicious for loosening.  Disc spacer at L1 to projects posteriorly as seen before on 02/12/2023 images.  No evidence of acute fractures.  Diagnostic x-rays of the left shoulder show mild acromioclavicular and glenohumeral osteoarthritis.  Subcortical cystic changes in the lateral humeral head can be seen with rotator cuff pathology.  Diagnostic x-rays of right shoulder show acromioclavicular and glenohumeral degenerative changes.  Subacromial spur.  Diagnostic x-rays of the thoracic spine show thoracolumbar fusion hardware extending from T10 level as before.  The T10 pedicle screw again approach to T9-T10 this space.  Stable chronic changes.  No acute fracture or compression deformity.  EMG/PNCV (2016) of lower extremity done by [facility] were interpreted as negative.  Discussed the use  of AI scribe software for clinical note transcription with the patient, who gave verbal consent to proceed.  History of Present Illness   The patient, with a history of arthritis affecting both shoulders and the spine, presented for a follow-up consultation. She has previously undergone thoracolumbar fusion extending to the T10 level and has hardware from T12 to L3. The patient has been experiencing pain, likely due to the arthritis and potential issues with the surgical hardware.  The arthritis affects the acromioclavicular and glenohumeral joints in both shoulders, with subcortical cyst changes in the lateral humeral head of the left shoulder. The right shoulder also exhibits a subacromial spur. The patient's thoracic spine shows a T10 pedicle screw approaching the T9-10 disc space, which was previously noted in prior studies.  In the lumbar spine, there are multilevel post-surgical changes from T12 to L3, with posterior fusion and hardware. There is increasing lucency around the L2 pedicle, suggesting potential loosening of the screw. The disc spacer at the L1-2 level projects posteriorly, as noted in prior studies.  The cervical spine shows scattered facet hypertrophy, more prominent at the C6-7 level, particularly on the right side. The patient's left knee shows mild tricompartmental osteoarthritis and chondrocalcinosis.  The patient's BMI is above 35, which may be contributing to the progression of her joint and spine issues. The patient also has elevated C-reactive protein and sed rate, indicating chronic inflammation.       Pharmacotherapy Assessment  Analgesic: No chronic opioid analgesics therapy prescribed by our practice. None MME/day: 0 mg/day   Monitoring: [place] PMP: PDMP reviewed during this encounter.       Pharmacotherapy: No side-effects or adverse reactions reported. Compliance: No problems identified. Effectiveness: Clinically acceptable.  Sibyl Drafts, RN  10/09/2023  10:39 AM  Sign when Signing Visit Safety precautions to be maintained throughout the outpatient stay will include: orient to surroundings, keep bed in low position, maintain call bell within reach at all times, provide assistance with transfer out of bed and ambulation.     No results found for: "CBDTHCR" No results found for: "D8THCCBX" No results found for: "D9THCCBX"  UDS:  Summary  Date Value Ref  Range Status  01/28/2023 Note  Final    Comment:    ==================================================================== Compliance Drug Analysis, Ur ==================================================================== Test                             Result       Flag       Units  Drug Present and Declared for Prescription Verification   Gabapentin                      PRESENT      EXPECTED   Paroxetine                      PRESENT      EXPECTED   Acetaminophen                   PRESENT      EXPECTED   Clonidine                       PRESENT      EXPECTED   Metoprolol                      PRESENT      EXPECTED  Drug Present not Declared for Prescription Verification   Ibuprofen                      PRESENT      UNEXPECTED  Drug Absent but Declared for Prescription Verification   Tramadol                        Not Detected UNEXPECTED ng/mg creat   Methocarbamol                   Not Detected UNEXPECTED   Diphenhydramine                 Not Detected UNEXPECTED ==================================================================== Test                      Result    Flag   Units      Ref Range   Creatinine              61               mg/dL      >=16 ==================================================================== Declared Medications:  The flagging and interpretation on this report are based on the  following declared medications.  Unexpected results may arise from  inaccuracies in the declared medications.   **Note: The testing scope of this panel includes these medications:    Clonidine  (Catapres )  Diphenhydramine  (Benadryl )  Gabapentin  (Neurontin )  Methocarbamol  (Robaxin )  Metoprolol  (Lopressor )  Paroxetine  (Paxil )  Tramadol  (Ultram )   **Note: The testing scope of this panel does not include small to  moderate amounts of these reported medications:   Acetaminophen  (Tylenol )   **Note: The testing scope of this panel does not include the  following reported medications:   Atorvastatin  (Lipitor)  Estradiol  (Estrace )  Fluticasone  (Flonase )  Furosemide  (Lasix )  Hydralazine  (Apresoline )  Hydrochlorothiazide  (Hydrodiuril )  Levothyroxine (Synthroid)  Menthol   Nifedipine  (Procardia )  Pantoprazole  (Protonix )  Potassium (Klor-Con )  Vitamin E  ==================================================================== For clinical consultation, please call [phone]. ====================================================================       ROS  Constitutional: Denies any fever or chills Gastrointestinal: No reported hemesis, hematochezia, vomiting, or acute GI distress Musculoskeletal: Denies any acute onset joint  swelling, redness, loss of ROM, or weakness Neurological: No reported episodes of acute onset apraxia, aphasia, dysarthria, agnosia, amnesia, paralysis, loss of coordination, or loss of consciousness  Medication Review  B Complex Vitamins, Menthol  (Topical Analgesic), NIFEdipine , PARoxetine , acetaminophen , atorvastatin , carvedilol , cloNIDine , diphenhydrAMINE , estradiol , fluticasone , furosemide , gabapentin , hydrALAZINE , levothyroxine, losartan , methocarbamol , pantoprazole , and vitamin E   History Review  Allergy: Amy [name] is allergic to latex, crestor [rosuvastatin calcium ], penicillins, tetanus toxoids, and tomato. Drug: Amy [name]  reports no history of drug use. Alcohol :  reports that she does not currently use alcohol . Tobacco:  reports that she quit smoking about 54 years ago. Her smoking use included cigarettes. She  started smoking about 59 years ago. She has a 1.3 pack-year smoking history. She has never used smokeless tobacco. Social: Ms. Kash  reports that she quit smoking about 54 years ago. Her smoking use included cigarettes. She started smoking about 59 years ago. She has a 1.3 pack-year smoking history. She has never used smokeless tobacco. She reports that she does not currently use alcohol . She reports that she does not use drugs. Medical:  has a past medical history of Abdominal pain of unknown etiology (02/05/2016), Anxiety, Arthritis, Asthma, Chronic back pain, Complication of anesthesia, Constipation, Depression, Dysphonia, Esophageal dysmotility, Fibromyalgia, Fibromyalgia, GERD (gastroesophageal reflux disease), Glaucoma, Headache, High cholesterol, History of DVT (deep vein thrombosis) (1989), History of hiatal hernia, History of MRSA infection (2011), Hypertension, Hypothyroidism, Irregular heart rate, Low iron, Lumbago, Mild obstructive sleep apnea, Neuropathy, Pelvic pain in female, Peripheral vascular disease (HCC), SBO (small bowel obstruction) (HCC) (06/2016), Seasonal allergies, Short of breath on exertion, Urinary frequency, Vaginal atrophy, and Weakness. Surgical: Amy [name]  has a past surgical history that includes Abdominal hysterectomy (1978); Nose surgery (2007); Mass excision (Left, 02/20/2013); Esophagogastroduodenoscopy (N/A, 07/15/2013); abdominal adhesions removed; Colonoscopy; Bunionectomy (Left, 2008); Shoulder open rotator cuff repair (Left, 05/05/2014); Esophageal manometry (N/A, 11/22/2014); Lumbar fusion (2014); REMOVAL HARDWARE L4-L5/  BILATERAL LAMINECTOMY L2 - L5 AND FUSION (06-12-2011); Laparoscopic cholecystectomy (01-11-2006); cysto with hydrodistension (N/A, 07/12/2015); Eye surgery (Bilateral); Tonsillectomy; Hardware Removal (N/A, 01/02/2016); Spine surgery; laparoscopy (N/A, 07/06/2016); laparotomy (N/A, 07/06/2016); Back surgery; Colon surgery; Dilation and  curettage of uterus; lipoma removal; Exploratory laparotomy; ingrown toenail removal; and Cataract extraction (Bilateral, 2017). Family: family history includes Aneurysm in her maternal grandmother; Cancer in her daughter, mother, and sister; Colon cancer in her father; Colon polyps in her father; Diabetes in her mother; Hypertension in her brother, brother, father, mother, and sister; Kidney disease in her brother and mother; Liver cancer in her brother and brother; Osteoarthritis in her mother; Stroke in her sister.  Laboratory Chemistry Profile   Renal Lab Results  Component Value Date   BUN 23 04/08/2023   CREATININE 1.17 (H) 04/08/2023   BCR 15 12/05/2022   GFRAA 66 07/28/2020   GFRNONAA 48 (L) 04/08/2023    Hepatic Lab Results  Component Value Date   AST 21 09/29/2022   ALT 16 09/29/2022   ALBUMIN  3.8 09/29/2022   ALKPHOS 103 09/29/2022   LIPASE 34 03/20/2021    Electrolytes Lab Results  Component Value Date   NA 139 04/08/2023   K 4.6 04/08/2023   CL 103 04/08/2023   CALCIUM  9.5 04/08/2023   MG 2.0 09/29/2022   PHOS 3.6 07/27/2016    Bone Lab Results  Component Value Date   25OHVITD1 47 01/28/2023   25OHVITD2 <1.0 01/28/2023   25OHVITD3 47 01/28/2023    Inflammation (CRP: Acute Phase) (ESR: Chronic Phase) Lab Results  Component Value  Date   CRP 1 01/28/2023   ESRSEDRATE 34 01/28/2023   LATICACIDVEN 1.09 07/21/2016         Note: Above Lab results reviewed.  Recent Imaging Review  DG Shoulder Right CLINICAL DATA:  Chronic pain of both shoulders. Right shoulder pain.  EXAM: RIGHT SHOULDER - 2+ VIEW  COMPARISON:  07/06/2021  FINDINGS: No acute fracture or dislocation. Similar acromioclavicular degenerative change. Similar subacromial spur. Mild inferior glenoid spurring. Mild subcortical cystic change in the lateral humeral head. No soft tissue calcifications.  IMPRESSION: 1. Acromioclavicular and glenohumeral degenerative change. 2. Subacromial  spur.  Electronically Signed   By: Chadwick Colonel M.D.   On: 09/28/2023 14:14 DG Thoracic Spine W/Swimmers CLINICAL DATA:  Chronic upper back pain. Pain of thoracic facet joint. Chronic bilateral thoracic back pain. Thoracic facet syndrome. Thoracic spine pain. Fusion of spine, thoracolumbar region. Fall, subsequent encounter. Falls frequently.  EXAM: THORACIC SPINE - 3 VIEWS  COMPARISON:  Thoracic spine radiograph 02/12/2023, CT 06/22/2022  FINDINGS: Thoracolumbar fusion hardware extending to the T10 level as before. The thoracic portion of the hardware is unchanged. The T10 pedicle screws again approach the T9-T10 disc space. There is vertebral body/endplate sclerosis at T8-T9 and T9-T10, without significant interval change. Mild exaggerated upper thoracic kyphosis again seen. There is no evidence of acute fracture or compression deformity. The facet spurring on prior CT is not well-defined by radiograph.  IMPRESSION: 1. Thoracolumbar fusion hardware extending to the T10 level as before. The T10 pedicle screws again approach the T9-T10 disc space. 2. Stable chronic change. 3. No acute fracture or compression deformity.  Electronically Signed   By: Chadwick Colonel M.D.   On: 09/28/2023 14:12 DG Lumbar Spine Complete W/Bend CLINICAL DATA:  Pain of thoracic facet joint. Chronic bilateral thoracic back pain. Fusion of spine, thoracolumbar. Fall, subsequent encounter. Chronic bilateral low back pain without sciatica. Chronic bilateral knee pain. At high risk for falls.  EXAM: LUMBAR SPINE - COMPLETE WITH BENDING VIEWS  COMPARISON:  Lumbar radiograph 02/12/2023  FINDINGS: Five non-rib-bearing lumbar vertebra. Stable alignment with postsurgical straightening. There is no abnormal motion on flexion or extension, limited range of motion due to fusion. T12 through L3 posterior fusion hardware. No hardware fracture. Anterior plate and screws at L5, S1, and posterior  screws at S1 again seen. The L1-L2 disc spacer projects posteriorly beyond the vertebral body cortex by 6 mm as before. Unchanged interbody spacer at L3-L4. There is increasing lucency about the L2 pedicle screws measuring up to 4 mm. No evidence of acute fracture. The bones are subjectively under mineralized. Degenerative change of both sacroiliac joints as before.  IMPRESSION: 1. Multilevel postsurgical change. T12 through L3 posterior fusion hardware. Increasing lucency about the L2 pedicle screws measuring up to 4 mm, suspicious for loosening. 2. Disc spacer at L1-L2 projects posteriorly as before. 3. No evidence of acute fracture.  Electronically Signed   By: Chadwick Colonel M.D.   On: 09/28/2023 14:10 DG Cervical Spine With Flex & Extend CLINICAL DATA:  Chronic upper back pain. Chronic pain of both shoulders. Fall, subsequent encounter. At high risk for falls. Cervicalgia with chronic neck pain.  EXAM: CERVICAL SPINE COMPLETE WITH FLEXION AND EXTENSION VIEWS  COMPARISON:  Same day cervical spine CT.  FINDINGS: The alignment is normal. Slight limited range of motion on extension, no instability. No acute fracture. Vertebral body heights are normal. The disc spaces are preserved. Scattered facet hypertrophy, most prominently affecting C6-C7 on the right. No high-grade bony neural  foraminal stenosis. No prevertebral soft tissue thickening.  IMPRESSION: 1. No acute fracture or subluxation of the cervical spine. 2. Scattered facet hypertrophy, most prominently affecting C6-C7 on the right.  Electronically Signed   By: Chadwick Colonel M.D.   On: 09/28/2023 14:04 DG Knee Complete 4 Views Left CLINICAL DATA:  Fall, subsequent encounter.  Left knee pain.  EXAM: LEFT KNEE - COMPLETE 4+ VIEW  COMPARISON:  None Available.  FINDINGS: No fracture. No dislocation. The alignment is normal. Mild medial tibiofemoral joint space narrowing. There is mild  tricompartmental peripheral spurring with spurring of the tibial spines. Faint chondrocalcinosis. No significant knee joint effusion. Quadriceps tendon enthesophyte.  IMPRESSION: 1. No fracture or subluxation of the left knee. 2. Mild tricompartmental osteoarthritis. 3. Chondrocalcinosis.  Electronically Signed   By: Chadwick Colonel M.D.   On: 09/28/2023 14:03 DG Shoulder Left CLINICAL DATA:  Chronic pain of both shoulders.  Recent fall.  EXAM: LEFT SHOULDER - 2+ VIEW  COMPARISON:  None Available.  FINDINGS: No acute fracture or dislocation. Mild acromioclavicular joint space narrowing and spurring. Trace inferior spurring of the glenohumeral joint. Subcortical cystic changes in the lateral humeral head. No erosions or focal bone abnormality. No soft tissue calcifications.  IMPRESSION: 1. Mild acromioclavicular and glenohumeral osteoarthritis. 2. Subcortical cystic changes in the lateral humeral head, can be seen with rotator cuff pathology.  Electronically Signed   By: Chadwick Colonel M.D.   On: 09/28/2023 14:02 Note: Reviewed        Physical Exam  General appearance: Well nourished, well developed, and well hydrated. In no apparent acute distress Mental status: Alert, oriented x 3 (person, place, & time)       Respiratory: No evidence of acute respiratory distress Eyes: PERLA Vitals: BP (!) 153/81   Pulse 92   Temp (!) 97.2 F (36.2 C)   Resp 16   Ht 5\' 5"  (1.651 m)   Wt 264 lb (119.7 kg)   SpO2 97%   BMI 43.93 kg/m  BMI: Estimated body mass index is 43.93 kg/m as calculated from the following:   Height as of this encounter: 5\' 5"  (1.651 m).   Weight as of this encounter: 264 lb (119.7 kg). Ideal: Ideal body weight: 57 kg (125 lb 10.6 oz) Adjusted ideal body weight: 82.1 kg (181 lb)  Assessment   Diagnosis Status  1. Cervical facet hypertrophy   2. Osteoarthritis of knee (Left)   3. Tricompartment osteoarthritis of knee (Left)   4. Osteoarthritis  of shoulder (Left)   5. Osteoarthritis of AC (acromioclavicular) joint (Left)   6. Osteoarthritis of glenohumeral joint (Left)   7. Osteoarthritis of AC (acromioclavicular) joint (Right)   8. Osteoarthritis of acromioclavicular joints (Bilateral)   9. Osteoarthritis of glenohumeral joint (Right)   10. Osteoarthritis of glenohumeral joints (Bilateral)   11. Loosening of hardware in spine (HCC) (L2 pedicle screw)   12. Latex precautions, history of latex allergy   13. Obesity, Class III, BMI 40-49.9 (morbid obesity) (HCC)   14. At high risk for falls    Controlled Controlled Controlled   Updated Problems: Problem  Cervical facet hypertrophy  Osteoarthritis of knee (Left)  Tricompartment osteoarthritis of knee (Left)  Osteoarthritis of shoulder (Left)  Osteoarthritis of AC (acromioclavicular) joint (Left)  Osteoarthritis of glenohumeral joint (Left)  Osteoarthritis of AC (acromioclavicular) joint (Right)  Osteoarthritis of acromioclavicular joints (Bilateral)  Osteoarthritis of glenohumeral joint (Right)  Osteoarthritis of glenohumeral joints (Bilateral)  Loosening of hardware in spine (HCC) (L2 pedicle screw)  Neuropathic pain of legs (Bilateral)  Latex Precautions, History of Latex Allergy    Plan of Care  Problem-specific:  Assessment and Plan    Acromioclavicular and Glenohumeral Arthritis Chronic arthritis affects both the acromioclavicular and glenohumeral joints, with mild subcortical cyst changes in the lateral humeral head suggesting possible rotator cuff issues. A subacromial spur in the right shoulder may cause impingement and pain. An MRI is needed for rotator cuff evaluation as x-rays are insufficient. Steroid injections can manage flare-ups but are not curative. Consider MRI if symptoms worsen and administer steroid injections for flare-ups.  Thoracolumbar Fusion with Hardware Loosening Post-surgical changes from T12 to L3 with posterior fusion and hardware show  increasing lucency around the L2 pedicle, suggesting screw loosening, potentially causing movement and pain. No acute fractures or new findings are present. Hardware loosening may be associated with added weight. If the screw breaks through the bone, it can cause pain and potential nerve issues. Refer to an orthopedic surgeon for evaluation of hardware and potential intervention.  Cervical Spine Arthritis Scattered facet hypertrophy, more prominent at C6-7 on the right side, causes axial neck pain. No acute fractures or subluxation are noted. Physical therapy can help manage neck pain, and monitoring for symptom progression is necessary. Monitor for progression of symptoms and consider physical therapy for neck pain management.  Tricompartmental Osteoarthritis and Chondrocalcinosis of the Left Knee Mild arthritis in all three compartments of the left knee with chondrocalcinosis leads to pain and decreased mobility. Steroid injections can manage flare-ups but are not curative. Referral to an orthopedic specialist may be necessary if symptoms worsen. Administer steroid injections for flare-ups and consider referral to an orthopedic specialist if symptoms worsen.  General Health Maintenance A BMI above 35 contributes to accelerated joint degeneration. Elevated C-reactive protein and sed rate indicate chronic inflammation. Weight management is crucial for joint health. Information on an anti-inflammatory diet and supplements has been provided. Refer to a weight management program, provide information on an anti-inflammatory diet and supplements, and discuss the importance of weight management for joint health.  Follow-up Schedule a follow-up appointment with a pain management specialist and coordinate with the primary care physician and cardiologist for comprehensive care.       Amy [name] has a current medication list which includes the following long-term medication(s): atorvastatin ,  carvedilol , clonidine , fluticasone , furosemide , hydralazine , levothyroxine, and nifedipine .  Pharmacotherapy (Medications Ordered): No orders of the defined types were placed in this encounter.  Orders:  Orders Placed This Encounter  Procedures   Amb Ref to Medical Weight Management    Referral Priority:   Routine    Referral Type:   Consultation    Referral Reason:   Specialty Services Required    Number of Visits Requested:   1   Amb Referral to Bariatric Surgery    Referral Priority:   Routine    Referral Type:   Consultation    Referral Reason:   Specialty Services Required    Number of Visits Requested:   1   Amb Ref to Medical Weight Management    Referral Priority:   Routine    Referral Type:   Consultation    Referral Reason:   Specialty Services Required    Number of Visits Requested:   1   Follow-up plan:   No follow-ups on file.      Interventional Therapies  Risk Factors  Considerations:  Hx. suicide attempt  Lumbar Post-Laminectomy Syndrome x6  MO (BMI>40)  High Risk for  Falls  GERD  HTN  OSA on CPAP (non-compliant)  PVD  Hx. Wound Dehiscence  BA  Anxiety  Hx. Complications w/ Anesthesia  Hx. DVT  Hx. MRSA  SOB on Exertion   Allergy: LATEX, PCN           Thoracolumbar HARDWARE (NO RFA)     Planned  Pending:      Under consideration:      Completed:   Diagnostic bilateral L2-S1 lumbar facet MBB x2 (07/04/2023) (100/100/100/LBP:100)  Diagnostic bilateral T9-T12 thoracic facet MBB x2 (04/09/2023) (100/100/100 x 2 days/0) (Do not repeat.) Diagnostic bilateral SI joint BLK x1 (03/12/2023) (100/100/80 x 2 days/40) (improvement in PSIS area)   Therapeutic  Palliative (PRN) options:   None established   Completed by other providers:   Therapeutic Nerve Blocks by [facility].      Recent Visits Date Type Provider Dept  09/24/23 Office Visit Renaldo Caroli, MD [facility]  09/10/23 Procedure  visit Renaldo Caroli, MD [facility]  08/29/23 Procedure visit Renaldo Caroli, MD [facility]  07/18/23 Office Visit Renaldo Caroli, MD [facility]  Showing recent visits within past 90 days and meeting all other requirements Today's Visits Date Type Provider Dept  10/09/23 Office Visit Renaldo Caroli, MD [facility]  Showing today's visits and meeting all other requirements Future Appointments No visits were found meeting these conditions. Showing future appointments within next 90 days and meeting all other requirements  I discussed the assessment and treatment plan with the patient. The patient was provided an opportunity to ask questions and all were answered. The patient agreed with the plan and demonstrated an understanding of the instructions.  Patient advised to call back or seek an in-person evaluation if the symptoms or condition worsens.  Duration of encounter: 56 minutes.  Total time on encounter, as per AMA guidelines included both the face-to-face and non-face-to-face time personally spent by the physician and/or other qualified health care professional(s) on the day of the encounter (includes time in activities that require the physician or other qualified health care professional and does not include time in activities normally performed by clinical staff). Physician's time may include the following activities when performed: Preparing to see the patient (e.g., pre-charting review of records, searching for previously ordered imaging, lab work, and nerve conduction tests) Review of prior analgesic pharmacotherapies. Reviewing PMP Interpreting ordered tests (e.g., lab work, imaging, nerve conduction tests) Performing post-procedure evaluations, including interpretation of diagnostic procedures Obtaining and/or reviewing separately obtained history Performing a medically appropriate examination and/or evaluation Counseling  and educating the patient/family/caregiver Ordering medications, tests, or procedures Referring and communicating with other health care professionals (when not separately reported) Documenting clinical information in the electronic or other health record Independently interpreting results (not separately reported) and communicating results to the patient/ family/caregiver Care coordination (not separately reported)  Note by: Candi Chafe, MD Date: 10/09/2023; Time: 11:41 AM

## 2023-10-09 ENCOUNTER — Ambulatory Visit: Payer: Medicare Other | Attending: Pain Medicine | Admitting: Pain Medicine

## 2023-10-09 VITALS — BP 153/81 | HR 92 | Temp 97.2°F | Resp 16 | Ht 65.0 in | Wt 264.0 lb

## 2023-10-09 DIAGNOSIS — Z6841 Body Mass Index (BMI) 40.0 and over, adult: Secondary | ICD-10-CM

## 2023-10-09 DIAGNOSIS — T84498A Other mechanical complication of other internal orthopedic devices, implants and grafts, initial encounter: Secondary | ICD-10-CM | POA: Insufficient documentation

## 2023-10-09 DIAGNOSIS — M19012 Primary osteoarthritis, left shoulder: Secondary | ICD-10-CM | POA: Diagnosis not present

## 2023-10-09 DIAGNOSIS — M19011 Primary osteoarthritis, right shoulder: Secondary | ICD-10-CM | POA: Diagnosis present

## 2023-10-09 DIAGNOSIS — M1712 Unilateral primary osteoarthritis, left knee: Secondary | ICD-10-CM | POA: Diagnosis not present

## 2023-10-09 DIAGNOSIS — Z9181 History of falling: Secondary | ICD-10-CM | POA: Insufficient documentation

## 2023-10-09 DIAGNOSIS — M47812 Spondylosis without myelopathy or radiculopathy, cervical region: Secondary | ICD-10-CM | POA: Diagnosis not present

## 2023-10-09 DIAGNOSIS — Z91040 Latex allergy status: Secondary | ICD-10-CM | POA: Insufficient documentation

## 2023-10-09 NOTE — Patient Instructions (Addendum)
 ______________________________________________________________________    Patient information on: Body mass index (BMI) and Weight Management  Dear [name] you are receiving this information because your weight may be adversely affecting your health.   Your current Estimated body mass index is 43.93 kg/m as calculated from the following:   Height as of this encounter: 5\' 5"  (1.651 m).   Weight as of this encounter: 264 lb (119.7 kg).  We recommend you talk to your primary care physician about providing or referring you to a supervised weight management program.  Here is some information about weight and the body mass index (BMI) classification:  BMI is a measure of obesity that's calculated by dividing a person's weight in kilograms by their height in meters squared. A person can use an online calculator to determine their BMI. Body mass index (BMI) is a common tool for deciding whether a person has an appropriate body weight.  It measures a person's weight in relation to their height.  According to the [facility] of health (NIH): A BMI of less than 18.5 means that a person is underweight. A BMI of between 18.5 and 24.9 is ideal. A BMI of between 25 and 29.9 is overweight. A BMI over 30 indicates obesity.  Body Mass Index (BMI) Classification BMI level (kg/m2) Category Associated incidence of chronic pain  <18  Underweight   18.5-24.9 Ideal body weight   25-29.9 Overweight  20%  30-34.9 Obese (Class I)  68%  35-39.9 Severe obesity (Class II)  136%  >40 Extreme obesity (Class III)  254%    Morbidly Obese Classification: You will be considered to be "Morbidly Obese" if your BMI is above 30 and you have one or more of the following conditions caused or associated to obesity: 1.    Type 2 Diabetes (Leading to cardiovascular diseases (CVD), stroke, peripheral vascular diseases (PVD), retinopathy, nephropathy, and neuropathy) 2.    Cardiovascular Disease (High Blood  Pressure; Congestive Heart Failure; High Cholesterol; Coronary Artery Disease; Angina; Arrhythmias, Dysrhythmias, or Heart Attacks) 3.    Breathing problems (Asthma; obesity-hypoventilation syndrome; obstructive sleep apnea; chronic inflammatory airway disease; reactive airway disease; or shortness of breath) 4.    Chronic kidney disease 5.    Liver disease (nonalcoholic fatty liver disease) 6.    High blood pressure 7.    Acid reflux (gastroesophageal reflux disease; heartburn) 8.    Osteoarthritis (OA) (affecting the hip(s), the knee(s) and/or the lower back) (usually requiring knee and/or hip replacements, as well as back surgeries) 9.    Low back pain (Lumbar Facet Syndrome; and/or Degenerative Disc Disease) 10.  Hip pain (Osteoarthritis of hip) (For every 1 lbs of added body weight, there is a 2 lbs increase in pressure inside of each hip articulation. 1:2 mechanical relationship) 11.  Knee pain (Osteoarthritis of knee) (For every 1 lbs of added body weight, there is a 4 lbs increase in pressure inside of each knee articulation. 1:4 mechanical relationship) (patients with a BMI>30 kg/m2 were 6.8 times more likely to develop knee OA than normal-weight individuals) 12.  Cancer: Epidemiological studies have shown that obesity is a risk factor for: post-menopausal breast cancer; cancers of the endometrium, colon and kidney cancer; malignant adenomas of the esophagus. Obese subjects have an approximately 1.5-3.5-fold increased risk of developing these cancers compared with normal-weight subjects, and it has been estimated that between 15 and 45% of these cancers can be attributed to overweight. More recent studies suggest that obesity may also increase the risk of other types  of cancer, including pancreatic, hepatic and gallbladder cancer. (Ref: Obesity and cancer. Pischon T, Nthlings U, [employer] H. Proc Nutr Soc. 2008 May;67(2):128-45. doi: 10.1017/S0029665108006976.) The International Agency for Research  on Cancer (IARC) has identified 13 cancers associated with overweight and obesity: meningioma, multiple myeloma, adenocarcinoma of the esophagus, and cancers of the thyroid , postmenopausal breast cancer, gallbladder, stomach, liver, pancreas, kidney, ovaries, uterus, colon and rectal (colorectal) cancers. 55 percent of all cancers diagnosed in women and 24 percent of those diagnosed in men are associated with overweight and obesity.  Recommendation: If you have any of the above conditions it is urgent that you take a step back and concentrate in losing weight. Dedicate 100% of your efforts on this task. Nothing else will improve your health more than bringing your weight down and your BMI to less than 30.   Nutritionist and/or supervised weight-management program: We are aware that most chronic pain patients are unable to exercise secondary to their pain. For this reason, you must rely on proper nutrition and diet in order to lose the weight. We recommend you talk to a nutritionist.   Bariatric surgery: A person might be considered a candidate for bariatric surgery if they meet one of the following BMI criteria:  BMI of 40 or higher: This is considered extreme obesity (Class III). BMI of 35-39.9: This is considered obesity, and the person might also have a serious weight-related health condition, such as high blood pressure, type 2 diabetes, or severe sleep apnea  BMI of 30-34.9: This might be considered if the person has serious weight-related health problems and hasn't had substantial weight loss or improvement in co-morbidities through other methods   On your own: A realistic goal is to lose 10% of your body weight over a period of 12 months.  If over a period of six (6) months you have unsuccessfully tried to lose weight, then it is time for you to seek professional help and to enter a medically supervised weight management program, and/or undergo bariatric surgery.   Pain management considerations  and possible limitations:  1.    Pharmacological Problems: Be advised that the use of opioid analgesics (oxycodone ; hydrocodone ; morphine ; methadone; codeine; and all of their derivatives) have been associated with decreased metabolism and weight gain.  For this reason, should we see that you are unable to lose weight while taking these medications, it may become necessary for us  to taper down and indefinitely discontinue them.  2.    Technical Problems: The incidence of successful interventional therapies decreases as the patient's BMI increases. It is much more difficult to accomplish a safe and effective interventional therapy on a patient with a BMI above 35. 3.    Radiation Exposure Problems: The x-rays machine, used to accomplish injection therapies, will automatically increase their x-ray output in order to capture an appropriate bone image. This means that radiation exposure increases exponentially with the patient's BMI. (The higher the BMI, the higher the radiation exposure.) Although the level of radiation used at a given time is still safe to the patient, it is not for the physician and/or assisting staff. Unfortunately, radiation exposure is accumulative. Because physicians and the staff have to do procedures and be exposed on a daily basis, this can result in health problems such as cancer and radiation burns. Radiation exposure to the staff is monitored by the radiation batches that they wear. The exposure levels are reported back to the staff on a quarterly basis. Depending on levels of exposure, physicians  and staff may be obligated by law to decrease this exposure. This means that they have the right and obligation to refuse providing therapies where they may be overexposed to radiation. For this reason, physicians may decline to offer therapies such as radiofrequency ablation or implants to patients with a BMI above 40. 4.    Current Trends: Be advised that the current trend is to no longer  offer certain therapies to patients with a BMI equal to, or above 35, due to increase perioperative risks, increased technical procedural difficulties, and excessive radiation exposure to healthcare personnel.  Last updated: 06/17/2023 ______________________________________________________________________   ______________________________________________________________________    OTC Supplements:   The following is a list of over-the-counter (OTC) supplements that have been found to have NIH [employer]) studies suggesting that they may be of some benefits when used in moderation in some chronic pain-related conditions.  NOTE:  Always consult with your primary care provider and/or pharmacist before taking any OTC medications to make sure they will not interact with your current medications. Always use manufacturer's recommended dosage.  Supplement Possible benefit May be of benefit in treatment of   Turmeric/curcumin anti-inflammatory Joint and muscle aches and pain.  Glucosamine/chondroitin (triple strength) may slow loss of articular cartilage Joint pain.  Vitamin D -3* may suppress release of chemicals associated with inflammation. Increases tolerance to pain. Joint and muscle aches and pain.   Moringa(+) anti-inflammatory with mild analgesic effects Joint and muscle aches and pain.  Melatonin(+) Helps reset sleep cycle. Insomnia.  Vitamin B-12* may help keep nerves and blood cells healthy as well as maintaining function of nervous system Nerve pain (Burning pain)  Alpha-Lipoic-Acid (ALA)* antioxidant that may help with nerve health, pain, and blocking the activation of some inflammatory chemicals Diabetic neuropathy and metabolic syndrome  superoxide dismutase (SOD)** Currently being reviewed.   Tiger Balm Currently being reviewed.   hydrolyzed collagen peptides* Currently being reviewed.  Collagen supplementation may increases bone strength, density, and mass; may  improve joint stiffness/mobility, and functionality; and may reduce joint pain. Possible chondroprotective effects. May help with protection of joint health.   Methylsulfonylmethane (MSM)* Currently being reviewed.   CBD(+) Currently being reviewed.   Delta-8 THC(+) Currently being reviewed.   *  Generally Recognized As Safe (GRAS) approved substance.-FDA (FindDrives.pl) ** "Possibly Safe", but not considered Generally Recognized As Safe (Not GRAS) by the [place]  Food and Drug Administration (FDA) as a food additive. (+) Not considered Generally Recognized As Safe (Not GRAS) by the [place]  Food and Drug Administration (FDA) as a food additive.  ______________________________________________________________________     For a more information on how diet can influence inflammation, please visit: [email or web address].com/[email or web address] NOTE: We are not associated or profit from this web site.

## 2023-10-09 NOTE — Progress Notes (Signed)
 Safety precautions to be maintained throughout the outpatient stay will include: orient to surroundings, keep bed in low position, maintain call bell within reach at all times, provide assistance with transfer out of bed and ambulation.

## 2023-10-16 ENCOUNTER — Ambulatory Visit: Payer: Medicare Other | Admitting: Cardiology

## 2023-10-28 ENCOUNTER — Other Ambulatory Visit: Payer: Self-pay | Admitting: Internal Medicine

## 2023-10-28 DIAGNOSIS — R600 Localized edema: Secondary | ICD-10-CM

## 2023-10-31 ENCOUNTER — Ambulatory Visit: Payer: Medicare Other | Attending: Cardiology | Admitting: Cardiology

## 2023-10-31 ENCOUNTER — Other Ambulatory Visit (HOSPITAL_COMMUNITY): Payer: Self-pay

## 2023-10-31 ENCOUNTER — Encounter: Payer: Self-pay | Admitting: Pharmacy Technician

## 2023-10-31 ENCOUNTER — Encounter: Payer: Self-pay | Admitting: Cardiology

## 2023-10-31 ENCOUNTER — Telehealth: Payer: Self-pay | Admitting: Pharmacy Technician

## 2023-10-31 VITALS — BP 144/70 | HR 86 | Ht 65.0 in | Wt 289.0 lb

## 2023-10-31 DIAGNOSIS — I2510 Atherosclerotic heart disease of native coronary artery without angina pectoris: Secondary | ICD-10-CM

## 2023-10-31 DIAGNOSIS — I10 Essential (primary) hypertension: Secondary | ICD-10-CM | POA: Diagnosis present

## 2023-10-31 DIAGNOSIS — G44309 Post-traumatic headache, unspecified, not intractable: Secondary | ICD-10-CM

## 2023-10-31 DIAGNOSIS — S0990XD Unspecified injury of head, subsequent encounter: Secondary | ICD-10-CM

## 2023-10-31 DIAGNOSIS — I471 Supraventricular tachycardia, unspecified: Secondary | ICD-10-CM | POA: Diagnosis present

## 2023-10-31 DIAGNOSIS — S0990XS Unspecified injury of head, sequela: Secondary | ICD-10-CM | POA: Insufficient documentation

## 2023-10-31 MED ORDER — SEMAGLUTIDE-WEIGHT MANAGEMENT 1 MG/0.5ML ~~LOC~~ SOAJ: 1.0000 mg | SUBCUTANEOUS | 0 refills | Status: AC

## 2023-10-31 MED ORDER — SEMAGLUTIDE-WEIGHT MANAGEMENT 2.4 MG/0.75ML ~~LOC~~ SOAJ: 2.4000 mg | SUBCUTANEOUS | 0 refills | Status: DC

## 2023-10-31 MED ORDER — SEMAGLUTIDE-WEIGHT MANAGEMENT 1.7 MG/0.75ML ~~LOC~~ SOAJ: 1.7000 mg | SUBCUTANEOUS | 0 refills | Status: DC

## 2023-10-31 MED ORDER — SEMAGLUTIDE-WEIGHT MANAGEMENT 0.5 MG/0.5ML ~~LOC~~ SOAJ: 0.5000 mg | SUBCUTANEOUS | 0 refills | Status: DC

## 2023-10-31 MED ORDER — WEGOVY 0.25 MG/0.5ML ~~LOC~~ SOAJ: 0.2500 mg | SUBCUTANEOUS | 0 refills | Status: DC

## 2023-10-31 MED ORDER — CARVEDILOL 12.5 MG PO TABS: 12.5000 mg | ORAL_TABLET | Freq: Two times a day (BID) | ORAL | 3 refills | Status: DC

## 2023-10-31 NOTE — Telephone Encounter (Deleted)
 Pharmacy Patient Advocate Encounter   Received notification from Fax that prior authorization for Wegovy  0.25MG /0.5ML auto-injectors  is required/requested.   Insurance verification completed.   The patient is insured through [employer] .   Per test claim: PA required; PA submitted to above mentioned insurance via CoverMyMeds Key/confirmation #/[id number] Status is pending

## 2023-10-31 NOTE — Telephone Encounter (Addendum)
 Pharmacy Patient Advocate Encounter   Received notification from Fax that prior authorization for Wegovy  0.25MG /0.5ML auto-injectors  is required/requested.   Insurance verification completed.   The patient is insured through [employer] .   Per test claim: PA required; PA submitted to above mentioned insurance via CoverMyMeds Key/confirmation #/[id number] Status is pending

## 2023-10-31 NOTE — Patient Instructions (Signed)
 Medication Instructions:   Start taking Wegovy   Month 1: 0.25 mg once a week - SAMPLES WERE GIVEN FOR THIS DOSE  Month 2: 0.5 mg once a week for 4 weeks (Call or send us  a MyChart message when you are on week 2, so we can send your next dose in for you).  Month 3: 1 mg once a week for 4 weeks.  Month 4: 1.7 mg once a week for 4 weeks.  Month 5 and beyond: 2.4 mg once a week (maintenance dose)  *If you need a refill on your cardiac medications before your next appointment, please call your pharmacy*   Lab Work:  None Ordered  If you have labs (blood work) drawn today and your tests are completely normal, you will receive your results only by: MyChart Message (if you have MyChart) OR A paper copy in the mail If you have any lab test that is abnormal or we need to change your treatment, we will call you to review the results.   Testing/Procedures:  None Ordered   Follow-Up: At [facility], you and your health needs are our priority.  As part of our continuing mission to provide you with exceptional heart care, we have created designated Provider Care Teams.  These Care Teams include your primary Cardiologist (physician) and Advanced Practice Providers (APPs -  Physician Assistants and Nurse Practitioners) who all work together to provide you with the care you need, when you need it.  We recommend signing up for the patient portal called MyChart.  Sign up information is provided on this After Visit Summary.  MyChart is used to connect with patients for Virtual Visits (Telemedicine).  Patients are able to view lab/test results, encounter notes, upcoming appointments, etc.  Non-urgent messages can be sent to your provider as well.   To learn more about what you can do with MyChart, go to [email or web address].    Your next appointment:   3 -4 month(s)  Provider:   You may see Redell Cave, MD or one of the following Advanced Practice Providers on your designated  Care Team:   Lonni Meager, NP Bernardino Bring, PA-C Cadence Franchester, PA-C Tylene Lunch, NP Barnie Hila, NP

## 2023-10-31 NOTE — Progress Notes (Signed)
 Cardiology Office Note:    Date:  10/31/2023   ID:  TAI SYFERT, DOB 12/11/1945, MRN [id number]  PCP:  Vernon Velna SAUNDERS, MD   [facility]  Cardiologist:  Redell Cave, MD  Advanced Practice Provider:  No care team member to display Electrophysiologist:  None       Referring MD: Vernon Velna SAUNDERS, MD   Chief Complaint  Patient presents with   Follow-up    Patient concerned with no improvement with SOBr.  Intermittent Feeling of fast heart bests.    History of Present Illness:    Amy [name] is a 78 y.o. 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  States falling about 3 weeks ago after tripping while trying to sit on her stroller.  She hit her head.  Presented to the ED, head CT was unrevealing.  Think she might have sustained a concoction.  Still has occasional headaches, had 1 episode in the office today.  States using her CPAP mask at least 5 days a week.  May forget once or twice.  Still short of breath and fatigue.  Prior notes Cardiac monitor 6/24 1 episode of paroxysmal SVT. Coronary CTA 10/2022, mild LAD stenosis 25% Lexiscan  Myoview  01/2021 no evidence for ischemia, low risk study. Echo 02/07/2021 normal systolic function, EF 60 to 65%, Echo 06/2016 showed normal systolic function, EF 60 to 65%.  Impaired relaxation.  Past Medical History:  Diagnosis Date   Abdominal pain of unknown etiology 02/05/2016   Anxiety    Arthritis    ddd- RA   Asthma    sleeping asthma   Chronic back pain    lumbar steroid injection's   Complication of anesthesia    Hard to wake up. Pt sts is took 3 days.   Constipation    Depression    Dysphonia    intermittent voice changes   Esophageal dysmotility    Fibromyalgia    Fibromyalgia    GERD (gastroesophageal reflux disease)     Glaucoma    Headache    High cholesterol    ? pt states her doctor told her to continue eating vegetables & fruit   History of DVT (deep vein thrombosis) 1989   LEFT UPPER ARM   History of hiatal hernia    History of MRSA infection 2011   Hypertension    Hypothyroidism    Irregular heart rate    years ago   Low iron    Lumbago    Mild obstructive sleep apnea    per study 02-07-2006 - no cpap   Neuropathy    feet   Pelvic pain in female    Peripheral vascular disease (HCC)    poor circulation   SBO (small bowel obstruction) (HCC) 06/2016   Seasonal allergies    Short of breath on exertion    Urinary frequency    Vaginal atrophy    Weakness    both hands and feet    Past Surgical History:  Procedure Laterality Date   abdominal adhesions removed     ABDOMINAL HYSTERECTOMY  1978   BACK SURGERY     BUNIONECTOMY Left 2008   CATARACT EXTRACTION Bilateral 2017   COLON SURGERY     COLONOSCOPY     CYSTO WITH HYDRODISTENSION N/A 07/12/2015   Procedure: CYSTOSCOPY/HYDRODISTENSION;  Surgeon: Glendia Elizabeth, MD;  Location: [facility];  Service: Urology;  Laterality: N/A;   DILATION AND CURETTAGE OF UTERUS     ESOPHAGEAL MANOMETRY N/A 11/22/2014   Procedure: ESOPHAGEAL MANOMETRY (EM);  Surgeon: Lynwood LITTIE Celestia Mickey., MD;  Location: [facility];  Service: Endoscopy;  Laterality: N/A;   ESOPHAGOGASTRODUODENOSCOPY N/A 07/15/2013   Procedure: ESOPHAGOGASTRODUODENOSCOPY (EGD);  Surgeon: Lynwood LITTIE Celestia Mickey., MD;  Location: [facility];  Service: Endoscopy;  Laterality: N/A;  need xray   EXPLORATORY LAPAROTOMY     EYE SURGERY Bilateral    cataract surgery with lens implants   HARDWARE REMOVAL N/A 01/02/2016   Procedure: Exploration of Lumbar Fusion,Removal of hardware Lumbar One-Two ;Redo Posterior Lumbar Fusion Lumbar One-Two;  Surgeon: Arley Helling, MD;  Location: [facility];  Service: Neurosurgery;  Laterality: N/A;   ingrown toenail removal     LAPAROSCOPIC  CHOLECYSTECTOMY  01-11-2006   LAPAROSCOPY N/A 07/06/2016   Procedure: LAPAROSCOPY DIAGNOSTIC EMERGENT TO OPEN;  Surgeon: Camellia Blush, MD;  Location: [facility];  Service: General;  Laterality: N/A;   LAPAROTOMY N/A 07/06/2016   Procedure: EXPLORATORY LAPAROTOMY LYSIS OF ADHESIONS FOR 3 HOURS;  Surgeon: Camellia Blush, MD;  Location: [facility];  Service: General;  Laterality: N/A;   lipoma removal     LUMBAR FUSION  2014   L4 -- L5   MASS EXCISION Left 02/20/2013   Procedure: EXCISION LEFT BUTTOCK  MASS;  Surgeon: Elspeth KYM Schultze, MD;  Location: [facility];  Service: General;  Laterality: Left;   NOSE SURGERY  2007   REMOVAL HARDWARE L4-L5/  BILATERAL LAMINECTOMY L2 - L5 AND FUSION  06-12-2011   SHOULDER OPEN ROTATOR CUFF REPAIR Left 05/05/2014   Procedure: OPEN ACROMIONECTOMY AND OPEN REPAIR OF ROTATOR CUFF, TISSUEMEND GRAFT WITH ANCHOR ;  Surgeon: Tanda DELENA Heading, MD;  Location: [facility];  Service: Orthopedics;  Laterality: Left;   SPINE SURGERY     x6   TONSILLECTOMY      Current Medications: Current Meds  Medication Sig   acetaminophen  (TYLENOL ) 500 MG tablet Take 1,000 mg by mouth every 6 (six) hours as needed for moderate pain.    Ascorbic Acid (VITAMIN C) 100 MG tablet Take 100 mg by mouth daily.   atorvastatin  (LIPITOR) 20 MG tablet Take 1 tablet (20 mg total) by mouth daily.   B Complex Vitamins (B COMPLEX PO) Take 1 tablet by mouth daily as needed.   cloNIDine  (CATAPRES ) 0.1 MG tablet Take 1 tablet (0.1 mg total) by mouth daily.   diphenhydrAMINE  (BENADRYL ) 25 MG tablet Take 25 mg by mouth 2 (two) times daily as needed for allergies.   estradiol  (ESTRACE ) 0.1 MG/GM vaginal cream Place 1 Applicatorful vaginally 2 (two) times a week.   fluticasone  (FLONASE ) 50 MCG/ACT nasal spray Place 1 spray into both nostrils daily.   furosemide  (LASIX ) 20 MG tablet Take 1 tablet (20 mg total) by mouth daily.   gabapentin  (NEURONTIN ) 100 MG capsule Take 100 mg by mouth 3 (three) times daily. Takes  100 mg AM  and 200 mg HS   hydrALAZINE  (APRESOLINE ) 100 MG tablet Take 1 tablet (100 mg total) by mouth 2 (two) times daily.   levothyroxine (SYNTHROID) 25 MCG tablet Take 25 mcg by mouth every morning.   losartan  (COZAAR ) 100 MG tablet Take 100 mg by mouth daily.   Menthol , Topical Analgesic, (ICY HOT BACK EX) Apply 1 application topically 2 (two) times daily as needed (pain).   methocarbamol  (ROBAXIN ) 500 MG tablet Take 500 mg by mouth 4 (four)  times daily as needed for muscle spasms.   NIFEdipine  (PROCARDIA  XL/NIFEDICAL-XL) 90 MG 24 hr tablet Take 1 tablet (90 mg total) by mouth daily.   pantoprazole  (PROTONIX ) 40 MG tablet Take 80 mg by mouth daily.    PARoxetine  (PAXIL ) 30 MG tablet Take 30 mg by mouth daily.   Potassium Chloride  ER 20 MEQ TBCR Take 1 tablet by mouth daily.   Prenatal Vit-Fe Fumarate-FA (M-VIT PO) Take by mouth.   Semaglutide -Weight Management (WEGOVY ) 0.25 MG/0.5ML SOAJ Inject 0.25 mg into the skin once a week.   [START ON 11/29/2023] Semaglutide -Weight Management 0.5 MG/0.5ML SOAJ Inject 0.5 mg into the skin once a week for 28 days.   [START ON 12/28/2023] Semaglutide -Weight Management 1 MG/0.5ML SOAJ Inject 1 mg into the skin once a week for 28 days.   [START ON 01/26/2024] Semaglutide -Weight Management 1.7 MG/0.75ML SOAJ Inject 1.7 mg into the skin once a week for 28 days.   [START ON 02/24/2024] Semaglutide -Weight Management 2.4 MG/0.75ML SOAJ Inject 2.4 mg into the skin once a week for 28 days.   vitamin E  180 MG (400 UNITS) capsule Take 400 Units by mouth daily as needed.   [DISCONTINUED] carvedilol  (COREG ) 12.5 MG tablet Take 1 tablet (12.5 mg total) by mouth 2 (two) times daily.     Allergies:   Latex, Crestor [rosuvastatin calcium ], Penicillins, Tetanus toxoids, and Tomato   Social History   Socioeconomic History   Marital status: Married    Spouse name: Not on file   Number of children: 1   Years of education: Not on file   Highest education level: Master's degree (e.g.,  MA, MS, MEng, MEd, MSW, MBA)  Occupational History   Not on file  Tobacco Use   Smoking status: Former    Current packs/day: 0.00    Average packs/day: 0.3 packs/day for 5.0 years (1.3 ttl pk-yrs)    Types: Cigarettes    Start date: 07/10/1964    Quit date: 07/10/1969    Years since quitting: 54.3   Smokeless tobacco: Never  Vaping Use   Vaping status: Never Used  Substance and Sexual Activity   Alcohol  use: Not Currently    Comment: rare, social   Drug use: No   Sexual activity: Not on file  Other Topics Concern   Not on file  Social History Narrative   Lives at home with husband    Right handed   Caffeine: max 2 cups coffee per day but not does not drink daily.   Social Drivers of Corporate Investment Banker Strain: Not on file  Food Insecurity: Not on file  Transportation Needs: Not on file  Physical Activity: Not on file  Stress: Not on file  Social Connections: Not on file     Family History: The patient's family history includes Aneurysm in her maternal grandmother; Cancer in her daughter, mother, and sister; Colon cancer in her father; Colon polyps in her father; Diabetes in her mother; Hypertension in her brother, brother, father, mother, and sister; Kidney disease in her brother and mother; Liver cancer in her brother and brother; Osteoarthritis in her mother; Stroke in her sister. There is no history of Migraines or Headache.  ROS:   Please see the history of present illness.     All other systems reviewed and are negative.  EKGs/Labs/Other Studies Reviewed:    The following studies were reviewed today:   EKG Interpretation Date/Time:  Thursday October 31 2023 08:40:57 EST Ventricular Rate:  86 PR Interval:  160 QRS Duration:  82 QT Interval:  380 QTC Calculation: 454 R Axis:   -12  Text Interpretation: Normal sinus rhythm Low voltage QRS Confirmed by Darliss Rogue ([id number]) on 10/31/2023 8:56:29 AM    Recent Labs: 04/08/2023: BUN 23;  Creatinine, Ser 1.17; Potassium 4.6; Sodium 139  Recent Lipid Panel    Component Value Date/Time   CHOL 165 07/03/2016 0627   TRIG 148 07/27/2016 0415   HDL 82 07/03/2016 0627   CHOLHDL 2.0 07/03/2016 0627   VLDL 8 07/03/2016 0627   LDLCALC 75 07/03/2016 0627     Risk Assessment/Calculations:      Physical Exam:    VS:  BP (!) 144/70 (BP Location: Left Arm, Patient Position: Sitting, Cuff Size: Large)   Pulse 86   Ht 5' 5 (1.651 m)   Wt 289 lb (131.1 kg)   SpO2 97%   BMI 48.09 kg/m     Wt Readings from Last 3 Encounters:  10/31/23 289 lb (131.1 kg)  10/09/23 264 lb (119.7 kg)  09/27/23 264 lb (119.7 kg)     GEN:  Well nourished, well developed in no acute distress HEENT: Normal NECK: No JVD; No carotid bruits CARDIAC: RRR, no murmurs, rubs, gallops RESPIRATORY:  Clear to auscultation without rales, wheezing or rhonchi  ABDOMEN: Soft, non-tender, non-distended MUSCULOSKELETAL:  trace edema; No deformit SKIN: Warm and dry NEUROLOGIC:  Alert and oriented x 3 PSYCHIATRIC:  Normal affect   ASSESSMENT:    1. Primary hypertension   2. Paroxysmal SVT (supraventricular tachycardia) (HCC)   3. Coronary artery disease involving native coronary artery of native heart, unspecified whether angina present   4. Morbid obesity (HCC)   5. Headaches due to old head trauma    PLAN:    In order of problems listed above:  Hypertension, BP reasonable.  Currently in pain.  Continue nifedipine  90 mg daily, losartan  100 mg daily, hydralazine  100 mg 3 times daily,Coreg  12.5 mg twice daily, clonidine  to 0.1 mg nightly.    Palpitations, occasional paroxysmal SVT.  Continue Coreg  12.5 mg twice daily as above. Mild Nonobstructive CAD, ( proximal LAD stenosis 25%).  Denies chest pain. LDL at goal, continue Lipitor 20 mg daily.. morbid obesity.  Advised to reduce carbs, sweets.  Start Wegovy .  Will provide sample.  Continue Lasix  20 mg daily.  A lot of her symptoms can be attributed to  morbid obesity.  Follow-up in 3 to 4 months   Medication Adjustments/Labs and Tests Ordered: Current medicines are reviewed at length with the patient today.  Concerns regarding medicines are outlined above.  Orders Placed This Encounter  Procedures   Ambulatory referral to Neurology   EKG 12-Lead    Meds ordered this encounter  Medications   carvedilol  (COREG ) 12.5 MG tablet    Sig: Take 1 tablet (12.5 mg total) by mouth 2 (two) times daily.    Dispense:  180 tablet    Refill:  3   Semaglutide -Weight Management 0.5 MG/0.5ML SOAJ    Sig: Inject 0.5 mg into the skin once a week for 28 days.    Dispense:  2 mL    Refill:  0   Semaglutide -Weight Management 1 MG/0.5ML SOAJ    Sig: Inject 1 mg into the skin once a week for 28 days.    Dispense:  2 mL    Refill:  0   Semaglutide -Weight Management 1.7 MG/0.75ML SOAJ    Sig: Inject 1.7 mg into the skin once a week for 28 days.  Dispense:  3 mL    Refill:  0   Semaglutide -Weight Management 2.4 MG/0.75ML SOAJ    Sig: Inject 2.4 mg into the skin once a week for 28 days.    Dispense:  3 mL    Refill:  0   Semaglutide -Weight Management (WEGOVY ) 0.25 MG/0.5ML SOAJ    Sig: Inject 0.25 mg into the skin once a week.    Dispense:  2 mL    Refill:  0    Lot Number?:   [id number]    Expiration Date?:   01/29/2024     Patient Instructions  Medication Instructions:   Start taking Wegovy   Month 1: 0.25 mg once a week - SAMPLES WERE GIVEN FOR THIS DOSE  Month 2: 0.5 mg once a week for 4 weeks (Call or send us  a MyChart message when you are on week 2, so we can send your next dose in for you).  Month 3: 1 mg once a week for 4 weeks.  Month 4: 1.7 mg once a week for 4 weeks.  Month 5 and beyond: 2.4 mg once a week (maintenance dose)  *If you need a refill on your cardiac medications before your next appointment, please call your pharmacy*   Lab Work:  None Ordered  If you have labs (blood work) drawn today and your tests are  completely normal, you will receive your results only by: MyChart Message (if you have MyChart) OR A paper copy in the mail If you have any lab test that is abnormal or we need to change your treatment, we will call you to review the results.   Testing/Procedures:  None Ordered   Follow-Up: At [facility], you and your health needs are our priority.  As part of our continuing mission to provide you with exceptional heart care, we have created designated Provider Care Teams.  These Care Teams include your primary Cardiologist (physician) and Advanced Practice Providers (APPs -  Physician Assistants and Nurse Practitioners) who all work together to provide you with the care you need, when you need it.  We recommend signing up for the patient portal called MyChart.  Sign up information is provided on this After Visit Summary.  MyChart is used to connect with patients for Virtual Visits (Telemedicine).  Patients are able to view lab/test results, encounter notes, upcoming appointments, etc.  Non-urgent messages can be sent to your provider as well.   To learn more about what you can do with MyChart, go to [email or web address].    Your next appointment:   3 -4 month(s)  Provider:   You may see Redell Cave, MD or one of the following Advanced Practice Providers on your designated Care Team:   Lonni Meager, NP Bernardino Bring, PA-C Cadence Franchester, PA-C Tylene Lunch, NP Barnie Hila, NP    Signed, Redell Cave, MD  10/31/2023 9:47 AM     [facility]

## 2023-10-31 NOTE — Telephone Encounter (Signed)
 error

## 2023-11-01 NOTE — Telephone Encounter (Signed)
 Pharmacy Patient Advocate Encounter  Received notification from [facility] [employer] that Prior Authorization for Wegovy  has been DENIED.  Full denial letter will be uploaded to the media tab. See denial reason below.   PA #/Case ID/Reference #: [id number]

## 2023-11-06 ENCOUNTER — Ambulatory Visit
Admission: RE | Admit: 2023-11-06 | Discharge: 2023-11-06 | Disposition: A | Discharge status: Home / Self Care | Payer: Medicare Other | Source: Ambulatory Visit | Attending: Internal Medicine

## 2023-11-06 DIAGNOSIS — R600 Localized edema: Secondary | ICD-10-CM

## 2023-11-27 ENCOUNTER — Encounter (INDEPENDENT_AMBULATORY_CARE_PROVIDER_SITE_OTHER): Payer: Self-pay

## 2023-12-02 ENCOUNTER — Encounter: Payer: Self-pay | Admitting: Internal Medicine

## 2023-12-02 ENCOUNTER — Ambulatory Visit: Payer: Medicare Other | Admitting: Internal Medicine

## 2023-12-02 VITALS — BP 132/76 | HR 86 | Temp 97.7°F | Ht 65.0 in | Wt 280.6 lb

## 2023-12-02 DIAGNOSIS — G4733 Obstructive sleep apnea (adult) (pediatric): Secondary | ICD-10-CM

## 2023-12-02 NOTE — Progress Notes (Signed)
 Name: Amy [name] MRN: [id number] DOB: 03/05/1946    CHIEF COMPLAINT:  Follow Up Assessment of Sleep Anea   HISTORY OF PRESENT ILLNESS: Follow up OSA assessment Patient diagnosed with sleep apnea in 2020 After further investigation with home sleep test and split-night sleep study seems like her  AHI was 37    CPAP compliance report reviewed in detail with patient Has excellent usage hours of almost 8 hours However she has several days where she forgets to put her CPAP machine I have relayed to her that she is at a high risk for cardiac problems and respiratory problems due to noncompliance Have educated her that her AHI is 37 However with the compliance of CPAP her AHI is 3 Auto CPAP 5-10   Discussed sleep data and reviewed with patient.  Encouraged proper weight management.  Discussed driving precautions and its relationship with hypersomnolence.  Discussed operating dangerous equipment and its relationship with hypersomnolence.  Discussed sleep hygiene, and benefits of a fixed sleep waked time.  The importance of getting eight or more hours of sleep discussed with patient.  Discussed limiting the use of the computer and television before bedtime.  Decrease naps during the day, so night time sleep will become enhanced.  Limit caffeine, and sleep deprivation.  HTN, stroke, and heart failure are potential risk factors.   No exacerbation at this time No evidence of heart failure at this time No evidence or signs of infection at this time No respiratory distress No fevers, chills, nausea, vomiting, diarrhea No evidence of lower extremity edema No evidence hemoptysis  PAST MEDICAL HISTORY :   has a past medical history of Abdominal pain of unknown etiology (02/05/2016), Anxiety, Arthritis, Asthma, Chronic back pain, Complication of anesthesia, Constipation, Depression, Dysphonia, Esophageal dysmotility, Fibromyalgia, Fibromyalgia, GERD (gastroesophageal reflux  disease), Glaucoma, Headache, High cholesterol, History of DVT (deep vein thrombosis) (1989), History of hiatal hernia, History of MRSA infection (2011), Hypertension, Hypothyroidism, Irregular heart rate, Low iron, Lumbago, Mild obstructive sleep apnea, Neuropathy, Pelvic pain in female, Peripheral vascular disease (HCC), SBO (small bowel obstruction) (HCC) (06/2016), Seasonal allergies, Short of breath on exertion, Urinary frequency, Vaginal atrophy, and Weakness.  has a past surgical history that includes Abdominal hysterectomy (1978); Nose surgery (2007); Mass excision (Left, 02/20/2013); Esophagogastroduodenoscopy (N/A, 07/15/2013); abdominal adhesions removed; Colonoscopy; Bunionectomy (Left, 2008); Shoulder open rotator cuff repair (Left, 05/05/2014); Esophageal manometry (N/A, 11/22/2014); Lumbar fusion (2014); REMOVAL HARDWARE L4-L5/  BILATERAL LAMINECTOMY L2 - L5 AND FUSION (06-12-2011); Laparoscopic cholecystectomy (01-11-2006); cysto with hydrodistension (N/A, 07/12/2015); Eye surgery (Bilateral); Tonsillectomy; Hardware Removal (N/A, 01/02/2016); Spine surgery; laparoscopy (N/A, 07/06/2016); laparotomy (N/A, 07/06/2016); Back surgery; Colon surgery; Dilation and curettage of uterus; lipoma removal; Exploratory laparotomy; ingrown toenail removal; and Cataract extraction (Bilateral, 2017). Prior to Admission medications   Medication Sig Start Date End Date Taking? Authorizing Provider  acetaminophen (TYLENOL) 500 MG tablet Take 1,000 mg by mouth every 6 (six) hours as needed for moderate pain.     [provider]  Ascorbic Acid (VITAMIN C) 100 MG tablet Take 100 mg by mouth daily.    [provider]  atorvastatin (LIPITOR) 20 MG tablet Take 1 tablet (20 mg total) by mouth daily. 01/16/21   Debbe Odea, MD  B Complex Vitamins (B COMPLEX PO) Take 1 tablet by mouth daily as needed.    [provider]  carvedilol (COREG) 12.5 MG tablet Take 1 tablet (12.5 mg total) by  mouth 2 (two) times daily. 10/31/23 10/25/24  Debbe Odea, MD  cloNIDine (CATAPRES) 0.1 MG tablet Take 1 tablet (0.1 mg total) by mouth daily. 07/16/23   Debbe Odea, MD  diphenhydrAMINE (BENADRYL) 25 MG tablet Take 25 mg by mouth 2 (two) times daily as needed for allergies.    [provider]  estradiol (ESTRACE) 0.1 MG/GM vaginal cream Place 1 Applicatorful vaginally 2 (two) times a week. 08/02/16   Meuth, Brooke A, PA-C  fluticasone (FLONASE) 50 MCG/ACT nasal spray Place 1 spray into both nostrils daily. 08/02/16   Meuth, Brooke A, PA-C  furosemide (LASIX) 20 MG tablet Take 1 tablet (20 mg total) by mouth daily. 04/22/23 04/16/24  Debbe Odea, MD  gabapentin (NEURONTIN) 100 MG capsule Take 100 mg by mouth 3 (three) times daily. Takes  100 mg AM and 200 mg HS    [provider]  hydrALAZINE (APRESOLINE) 100 MG tablet Take 1 tablet (100 mg total) by mouth 2 (two) times daily. 09/05/23   Debbe Odea, MD  levothyroxine (SYNTHROID) 25 MCG tablet Take 25 mcg by mouth every morning. 02/12/22   [provider]  losartan (COZAAR) 100 MG tablet Take 100 mg by mouth daily. 01/07/23   [provider]  Menthol, Topical Analgesic, (ICY HOT BACK EX) Apply 1 application topically 2 (two) times daily as needed (pain).    [provider]  methocarbamol (ROBAXIN) 500 MG tablet Take 500 mg by mouth 4 (four) times daily as needed for muscle spasms. 03/28/21   [provider]  NIFEdipine (PROCARDIA XL/NIFEDICAL-XL) 90 MG 24 hr tablet Take 1 tablet (90 mg total) by mouth daily. 04/22/23   Debbe Odea, MD  pantoprazole (PROTONIX) 40 MG tablet Take 80 mg by mouth daily.  01/17/18   [provider]  PARoxetine (PAXIL) 30 MG tablet Take 30 mg by mouth daily.    [provider]  Potassium Chloride ER 20 MEQ TBCR Take 1 tablet by mouth daily. 10/30/23   [provider]  Prenatal Vit-Fe Fumarate-FA (M-VIT PO) Take by mouth.     [provider]  Semaglutide-Weight Management (WEGOVY) 0.25 MG/0.5ML SOAJ Inject 0.25 mg into the skin once a week. 10/31/23   Debbe Odea, MD  Semaglutide-Weight Management 0.5 MG/0.5ML SOAJ Inject 0.5 mg into the skin once a week for 28 days. 11/29/23 12/27/23  Debbe Odea, MD  Semaglutide-Weight Management 1 MG/0.5ML SOAJ Inject 1 mg into the skin once a week for 28 days. 12/28/23 01/25/24  Debbe Odea, MD  Semaglutide-Weight Management 1.7 MG/0.75ML SOAJ Inject 1.7 mg into the skin once a week for 28 days. 01/26/24 02/23/24  Debbe Odea, MD  Semaglutide-Weight Management 2.4 MG/0.75ML SOAJ Inject 2.4 mg into the skin once a week for 28 days. 02/24/24 03/23/24  Debbe Odea, MD  vitamin E 180 MG (400 UNITS) capsule Take 400 Units by mouth daily as needed.    [provider]   Allergies  Allergen Reactions   Latex Other (See Comments)    Sores, blisters - reaction to gloves   Crestor [Rosuvastatin Calcium]     Myalgias   Penicillins Other (See Comments)    UNSPECIFIED REACTION  "Has taken amoxicillin and ampicillin with no reaction" Has patient had a PCN reaction causing immediate rash, facial/tongue/throat swelling, SOB or lightheadedness with hypotension: Unknown Has patient had a PCN reaction causing severe rash involving mucus membranes or skin necrosis: Unknown Has patient had a PCN reaction that required hospitalization: Unknown Has patient had a PCN reaction occurring within the last 10 years: No    Tetanus Toxoids  Rash   Tomato Rash    FAMILY HISTORY:  family history includes Aneurysm in her maternal grandmother; Cancer in her daughter, mother, and sister; Colon cancer in her father; Colon polyps in her father; Diabetes in her mother; Hypertension in her brother, brother, father, mother, and sister; Kidney disease in her brother and mother; Liver cancer in her brother and brother; Osteoarthritis in her mother; Stroke in her sister. SOCIAL  HISTORY:  reports that she quit smoking about 54 years ago. Her smoking use included cigarettes. She started smoking about 59 years ago. She has a 1.3 pack-year smoking history. She has never used smokeless tobacco. She reports that she does not currently use alcohol. She reports that she does not use drugs.  BP 132/76 (BP Location: Left Arm, Patient Position: Sitting, Cuff Size: Normal)   Pulse 86   Temp 97.7 F (36.5 C) (Temporal)   Ht 5\' 5"  (1.651 m)   Wt 280 lb 9.6 oz (127.3 kg)   SpO2 96%   BMI 46.69 kg/m     Review of Systems: Gen:  Denies  fever, sweats, chills weight loss  HEENT: Denies blurred vision, double vision, ear pain, eye pain, hearing loss, nose bleeds, sore throat Cardiac:  No dizziness, chest pain or heaviness, chest tightness,edema, No JVD Resp:   No cough, -sputum production, -shortness of breath,-wheezing, -hemoptysis,  Other:  All other systems negative   Physical Examination:   General Appearance: No distress  EYES PERRLA, EOM intact.   NECK Supple, No JVD ORAL CAVITY MALLAMPATI 4 Pulmonary: normal breath sounds, No wheezing.  CardiovascularNormal S1,S2.  No m/r/g.   Abdomen: Benign, Soft, non-tender. Neurology UE/LE 5/5 strength, no focal deficits Ext pulses intact, cap refill intact ALL OTHER ROS ARE NEGATIVE    ASSESSMENT AND PLAN SYNOPSIS 78 year old pleasant African-American female seen today for follow-up assessment for severe OSA with AHI of 37 in the setting of morbid obesity and deconditioned state  Assessment of OSA Severe OSA AHI of 37 OSA is controlled when she is compliant with her CPAP AHI is reduced to 3.0 Auto CPAP 5-10 I have encouraged her to use her CPAP nightly and with naps Patient uses and benefits from therapy Using CPAP nightly and with naps Pressure setting is comfortable and is sleeping well. Sometimes she forgets to place CPAP on  Patient Instructions  Continue to use CPAP every night, minimum of 4-6 hours a  night.  Change equipment every 30 days or as directed by DME.  Wash your tubing with warm soap and water daily, hang to dry. Wash humidifier portion weekly. Use bottled, distilled water and change daily   Be aware of reduced alertness and do not drive or operate heavy machinery if experiencing this or drowsiness.  Exercise encouraged, as tolerated. Encouraged proper weight management.  Important to get eight or more hours of sleep  Limiting the use of the computer and television before bedtime.  Decrease naps during the day, so night time sleep will become enhanced.  Limit caffeine, and sleep deprivation.  HTN, stroke, uncontrolled diabetes and heart failure are potential risk factors.  Risk of untreated sleep apnea including cardiac arrhthymias, stroke, DM, pulm HTN.     Obesity -recommend significant weight loss -recommend changing diet  Deconditioned state -Recommend increased daily activity and exercise    MEDICATION ADJUSTMENTS/LABS AND TESTS ORDERED: Please use CPAP every night Avoid Allergens and Irritants Avoid secondhand smoke Avoid SICK contacts Recommend  Masking  when appropriate Recommend Keep up-to-date with vaccinations  CURRENT MEDICATIONS REVIEWED AT LENGTH WITH PATIENT TODAY   Patient  satisfied with Plan of action and management. All questions answered   Follow up 6 weeks   I spent a total of 41 minutes reviewing chart data, face-to-face evaluation with the patient, counseling and coordination of care as detailed above.      Lucie Leather, M.D.  [facility]  Medical Director [facility] Medical Director [facility] Cardio-Pulmonary Department

## 2023-12-02 NOTE — Patient Instructions (Signed)
 Please use CPAP every night  Patient Instructions  Continue to use CPAP every night, minimum of 4-6 hours a night.  Change equipment every 30 days or as directed by DME.  Wash your tubing with warm soap and water daily, hang to dry. Wash humidifier portion weekly. Use bottled, distilled water and change daily   Be aware of reduced alertness and do not drive or operate heavy machinery if experiencing this or drowsiness.  Exercise encouraged, as tolerated. Encouraged proper weight management.  Important to get eight or more hours of sleep  Limiting the use of the computer and television before bedtime.  Decrease naps during the day, so night time sleep will become enhanced.  Limit caffeine, and sleep deprivation.  HTN, stroke, uncontrolled diabetes and heart failure are potential risk factors.  Risk of untreated sleep apnea including cardiac arrhthymias, stroke, DM, pulm HTN.

## 2023-12-04 ENCOUNTER — Encounter: Payer: Self-pay | Admitting: Podiatry

## 2023-12-04 ENCOUNTER — Ambulatory Visit (INDEPENDENT_AMBULATORY_CARE_PROVIDER_SITE_OTHER): Admitting: Podiatry

## 2023-12-04 DIAGNOSIS — I872 Venous insufficiency (chronic) (peripheral): Secondary | ICD-10-CM | POA: Diagnosis not present

## 2023-12-04 DIAGNOSIS — M7989 Other specified soft tissue disorders: Secondary | ICD-10-CM

## 2023-12-04 NOTE — Progress Notes (Signed)
 Subjective:  Patient ID: Amy [name], female    DOB: [birth year],  MRN: [id number] HPI Chief Complaint  Patient presents with   Foot Pain    Bilateral lower extremity-swelling, down in the AM but any walking causes lots of swelling, concerned about dark thick nails, tender, plantar and dorsal feet feet funny, sometimes painful, weakness in feet, darkness in skin right medial ankle-no DVT   New Patient (Initial Visit)    Est pt 2021    78 y.o. female presents with the above complaint.   ROS: Denies fever chills nausea vomit muscle aches pains calf pain back pain chest pain shortness of breath.  Past Medical History:  Diagnosis Date   Abdominal pain of unknown etiology 02/05/2016   Anxiety    Arthritis    ddd- RA   Asthma    "sleeping asthma"   Chronic back pain    lumbar steroid injection's   Complication of anesthesia    Hard to wake up. Pt sts is took 3 days.   Constipation    Depression    Dysphonia    intermittent "voice changes"   Esophageal dysmotility    Fibromyalgia    Fibromyalgia    GERD (gastroesophageal reflux disease)    Glaucoma    Headache    High cholesterol    ? pt states her doctor told her to continue eating vegetables & fruit   History of DVT (deep vein thrombosis) 1989   LEFT UPPER ARM   History of hiatal hernia    History of MRSA infection 2011   Hypertension    Hypothyroidism    Irregular heart rate    "years ago"   Low iron    Lumbago    Mild obstructive sleep apnea    per study 02-07-2006 - no cpap   Neuropathy    feet   Pelvic pain in female    Peripheral vascular disease (HCC)    "poor circulation"   SBO (small bowel obstruction) (HCC) 06/2016   Seasonal allergies    Short of breath on exertion    Urinary frequency    Vaginal atrophy    Weakness    both hands and feet   Past Surgical History:  Procedure Laterality Date   abdominal adhesions removed     ABDOMINAL HYSTERECTOMY  1978   BACK SURGERY      BUNIONECTOMY Left 2008   CATARACT EXTRACTION Bilateral 2017   COLON SURGERY     COLONOSCOPY     CYSTO WITH HYDRODISTENSION N/A 07/12/2015   Procedure: CYSTOSCOPY/HYDRODISTENSION;  Surgeon: Alfredo Martinez, MD;  Location: [facility];  Service: Urology;  Laterality: N/A;   DILATION AND CURETTAGE OF UTERUS     ESOPHAGEAL MANOMETRY N/A 11/22/2014   Procedure: ESOPHAGEAL MANOMETRY (EM);  Surgeon: Vertell Novak., MD;  Location: [facility];  Service: Endoscopy;  Laterality: N/A;   ESOPHAGOGASTRODUODENOSCOPY N/A 07/15/2013   Procedure: ESOPHAGOGASTRODUODENOSCOPY (EGD);  Surgeon: Vertell Novak., MD;  Location: [facility];  Service: Endoscopy;  Laterality: N/A;  need xray   EXPLORATORY LAPAROTOMY     EYE SURGERY Bilateral    cataract surgery with lens implants   HARDWARE REMOVAL N/A 01/02/2016   Procedure: Exploration of Lumbar Fusion,Removal of hardware Lumbar One-Two ;Redo Posterior Lumbar Fusion Lumbar One-Two;  Surgeon: Donalee Citrin, MD;  Location: [facility];  Service: Neurosurgery;  Laterality: N/A;   ingrown toenail removal     LAPAROSCOPIC CHOLECYSTECTOMY  01-11-2006   LAPAROSCOPY N/A 07/06/2016  Procedure: LAPAROSCOPY DIAGNOSTIC EMERGENT TO OPEN;  Surgeon: Gaynelle Adu, MD;  Location: [facility] OR;  Service: General;  Laterality: N/A;   LAPAROTOMY N/A 07/06/2016   Procedure: EXPLORATORY LAPAROTOMY LYSIS OF ADHESIONS FOR 3 HOURS;  Surgeon: Gaynelle Adu, MD;  Location: [facility] OR;  Service: General;  Laterality: N/A;   lipoma removal     LUMBAR FUSION  2014   L4 -- L5   MASS EXCISION Left 02/20/2013   Procedure: EXCISION LEFT BUTTOCK  MASS;  Surgeon: Ardeth Sportsman, MD;  Location: [facility];  Service: General;  Laterality: Left;   NOSE SURGERY  2007   REMOVAL HARDWARE L4-L5/  BILATERAL LAMINECTOMY L2 - L5 AND FUSION  06-12-2011   SHOULDER OPEN ROTATOR CUFF REPAIR Left 05/05/2014   Procedure: OPEN ACROMIONECTOMY AND OPEN REPAIR OF ROTATOR CUFF, TISSUEMEND GRAFT WITH ANCHOR ;   Surgeon: Jacki Cones, MD;  Location: [facility];  Service: Orthopedics;  Laterality: Left;   SPINE SURGERY     x6   TONSILLECTOMY      Current Outpatient Medications:    KLOR-CON M20 20 MEQ tablet, Take 20 mEq by mouth daily., Disp: , Rfl:    traMADol (ULTRAM) 50 MG tablet, Take by mouth., Disp: , Rfl:    acetaminophen (TYLENOL) 500 MG tablet, Take 1,000 mg by mouth every 6 (six) hours as needed for moderate pain. , Disp: , Rfl:    Ascorbic Acid (VITAMIN C) 100 MG tablet, Take 100 mg by mouth daily., Disp: , Rfl:    atorvastatin (LIPITOR) 20 MG tablet, Take 1 tablet (20 mg total) by mouth daily., Disp: 30 tablet, Rfl: 3   B Complex Vitamins (B COMPLEX PO), Take 1 tablet by mouth daily as needed., Disp: , Rfl:    carvedilol (COREG) 12.5 MG tablet, Take 1 tablet (12.5 mg total) by mouth 2 (two) times daily., Disp: 180 tablet, Rfl: 3   cloNIDine (CATAPRES) 0.1 MG tablet, Take 1 tablet (0.1 mg total) by mouth daily., Disp: 180 tablet, Rfl: 0   diphenhydrAMINE (BENADRYL) 25 MG tablet, Take 25 mg by mouth 2 (two) times daily as needed for allergies., Disp: , Rfl:    estradiol (ESTRACE) 0.1 MG/GM vaginal cream, Place 1 Applicatorful vaginally 2 (two) times a week., Disp: 42.5 g, Rfl: 12   fluticasone (FLONASE) 50 MCG/ACT nasal spray, Place 1 spray into both nostrils daily., Disp: , Rfl: 2   furosemide (LASIX) 20 MG tablet, Take 1 tablet (20 mg total) by mouth daily., Disp: 90 tablet, Rfl: 0   gabapentin (NEURONTIN) 100 MG capsule, Take 100 mg by mouth 3 (three) times daily. Takes  100 mg AM and 200 mg HS, Disp: , Rfl:    hydrALAZINE (APRESOLINE) 100 MG tablet, Take 1 tablet (100 mg total) by mouth 2 (two) times daily., Disp: 180 tablet, Rfl: 0   levothyroxine (SYNTHROID) 25 MCG tablet, Take 25 mcg by mouth every morning., Disp: , Rfl:    losartan (COZAAR) 100 MG tablet, Take 100 mg by mouth daily., Disp: , Rfl:    Menthol, Topical Analgesic, (ICY HOT BACK EX), Apply 1 application topically 2 (two)  times daily as needed (pain)., Disp: , Rfl:    methocarbamol (ROBAXIN) 500 MG tablet, Take 500 mg by mouth 4 (four) times daily as needed for muscle spasms., Disp: , Rfl:    NIFEdipine (PROCARDIA XL/NIFEDICAL-XL) 90 MG 24 hr tablet, Take 1 tablet (90 mg total) by mouth daily., Disp: 90 tablet, Rfl: 3   pantoprazole (PROTONIX) 40 MG tablet,  Take 80 mg by mouth daily. , Disp: , Rfl: 0   PARoxetine (PAXIL) 30 MG tablet, Take 30 mg by mouth daily., Disp: , Rfl:    Potassium Chloride ER 20 MEQ TBCR, Take 1 tablet by mouth daily., Disp: , Rfl:    [START ON 12/28/2023] Semaglutide-Weight Management 1 MG/0.5ML SOAJ, Inject 1 mg into the skin once a week for 28 days., Disp: 2 mL, Rfl: 0   [START ON 01/26/2024] Semaglutide-Weight Management 1.7 MG/0.75ML SOAJ, Inject 1.7 mg into the skin once a week for 28 days., Disp: 3 mL, Rfl: 0   vitamin E 180 MG (400 UNITS) capsule, Take 400 Units by mouth daily as needed., Disp: , Rfl:   Allergies  Allergen Reactions   Latex Other (See Comments)    Sores, blisters - reaction to gloves   Crestor [Rosuvastatin Calcium]     Myalgias   Penicillins Other (See Comments)    UNSPECIFIED REACTION  "Has taken amoxicillin and ampicillin with no reaction" Has patient had a PCN reaction causing immediate rash, facial/tongue/throat swelling, SOB or lightheadedness with hypotension: Unknown Has patient had a PCN reaction causing severe rash involving mucus membranes or skin necrosis: Unknown Has patient had a PCN reaction that required hospitalization: Unknown Has patient had a PCN reaction occurring within the last 10 years: No    Tetanus Toxoids Rash   Tomato Rash   Review of Systems Objective:  There were no vitals filed for this visit.  General: Well developed, nourished, in no acute distress, alert and oriented x3   Dermatological: Skin is warm, dry and supple bilateral. Nails x 10 are well maintained; remaining integument appears unremarkable at this time. There are  no open sores, no preulcerative lesions, no rash or signs of infection present.  Hallux nails bilateral have been removed permanently.  Hammertoe deformity of the second toes bilaterally resulting in thick dystrophic discolored nail.  Postinflammatory hyperpigmentation medial aspect of the right ankle due to stasis.  No open wounds.  Does not appear to be cellulitic.  Vascular: Dorsalis Pedis artery and Posterior Tibial artery pedal pulses are 2/4 bilateral with immedate capillary fill time. Pedal hair growth present. No large varicosities some mild bilateral pitting lower extremity edema  Neruologic: Grossly intact via light touch bilateral. Vibratory intact via tuning fork bilateral. Protective threshold with Semmes Wienstein monofilament intact to all pedal sites bilateral. Patellar and Achilles deep tendon reflexes 2+ bilateral. No Babinski or clonus noted bilateral.   Musculoskeletal: No gross boney pedal deformities bilateral. No pain, crepitus, or limitation noted with foot and ankle range of motion bilateral. Muscular strength 5/5 in all groups tested bilateral.  Gait: Unassisted, Nonantalgic.    Radiographs:  None taken  Assessment & Plan:   Assessment: Venous insufficiency bilateral congestive heart failure possibly.  Venous stasis right ankle.  Neuropathic symptoms plantar aspect.  Plan: Currently we are going to refer her to vascular for venous insufficiency study.  Will probably need compression hose.  May need to follow-up with primary for more diuretic control.  Did recommend nervive alpha lipoic acid.  Follow-up with her once her venous studies done     Kishaun Erekson T. [place], [place]

## 2023-12-10 ENCOUNTER — Other Ambulatory Visit: Payer: Self-pay | Admitting: Neurosurgery

## 2023-12-10 DIAGNOSIS — M5440 Lumbago with sciatica, unspecified side: Secondary | ICD-10-CM

## 2023-12-26 ENCOUNTER — Ambulatory Visit
Admission: RE | Admit: 2023-12-26 | Discharge: 2023-12-26 | Discharge status: Home / Self Care | Source: Ambulatory Visit | Attending: Neurosurgery | Admitting: Neurosurgery

## 2023-12-26 ENCOUNTER — Ambulatory Visit
Admission: RE | Admit: 2023-12-26 | Discharge: 2023-12-26 | Disposition: A | Discharge status: Home / Self Care | Source: Ambulatory Visit | Attending: Neurosurgery | Admitting: Neurosurgery

## 2023-12-26 DIAGNOSIS — M5440 Lumbago with sciatica, unspecified side: Secondary | ICD-10-CM

## 2024-01-03 ENCOUNTER — Encounter

## 2024-01-13 ENCOUNTER — Ambulatory Visit (HOSPITAL_COMMUNITY)
Admission: RE | Admit: 2024-01-13 | Discharge: 2024-01-13 | Disposition: A | Discharge status: Home / Self Care | Source: Ambulatory Visit | Attending: Podiatry | Admitting: Podiatry

## 2024-01-13 DIAGNOSIS — I872 Venous insufficiency (chronic) (peripheral): Secondary | ICD-10-CM | POA: Diagnosis not present

## 2024-01-15 ENCOUNTER — Ambulatory Visit
Admission: RE | Admit: 2024-01-15 | Discharge: 2024-01-15 | Disposition: A | Discharge status: Home / Self Care | Payer: Medicare Other | Source: Ambulatory Visit | Attending: Internal Medicine

## 2024-01-15 DIAGNOSIS — Z13820 Encounter for screening for osteoporosis: Secondary | ICD-10-CM

## 2024-01-16 ENCOUNTER — Encounter: Payer: Self-pay | Admitting: Radiology

## 2024-01-17 ENCOUNTER — Other Ambulatory Visit: Payer: Self-pay

## 2024-01-17 MED ORDER — FUROSEMIDE 20 MG PO TABS
20.0000 mg | ORAL_TABLET | Freq: Every day | ORAL | 0 refills | Status: DC
Start: 2024-01-17 — End: 2024-01-28

## 2024-01-21 ENCOUNTER — Other Ambulatory Visit: Payer: Self-pay

## 2024-01-21 DIAGNOSIS — I872 Venous insufficiency (chronic) (peripheral): Secondary | ICD-10-CM

## 2024-01-21 DIAGNOSIS — M7989 Other specified soft tissue disorders: Secondary | ICD-10-CM

## 2024-01-28 ENCOUNTER — Ambulatory Visit: Admitting: Internal Medicine

## 2024-01-28 ENCOUNTER — Encounter: Payer: Self-pay | Admitting: Internal Medicine

## 2024-01-28 ENCOUNTER — Ambulatory Visit: Payer: Medicare Other | Attending: Cardiology | Admitting: Cardiology

## 2024-01-28 ENCOUNTER — Encounter: Payer: Self-pay | Admitting: Cardiology

## 2024-01-28 VITALS — BP 148/85 | HR 81 | Ht 67.0 in | Wt 277.0 lb

## 2024-01-28 VITALS — BP 134/80 | HR 79 | Temp 98.4°F | Ht 67.0 in | Wt 276.0 lb

## 2024-01-28 DIAGNOSIS — Z79899 Other long term (current) drug therapy: Secondary | ICD-10-CM | POA: Insufficient documentation

## 2024-01-28 DIAGNOSIS — I471 Supraventricular tachycardia, unspecified: Secondary | ICD-10-CM | POA: Diagnosis present

## 2024-01-28 DIAGNOSIS — I10 Essential (primary) hypertension: Secondary | ICD-10-CM | POA: Diagnosis present

## 2024-01-28 DIAGNOSIS — G4733 Obstructive sleep apnea (adult) (pediatric): Secondary | ICD-10-CM

## 2024-01-28 DIAGNOSIS — I2510 Atherosclerotic heart disease of native coronary artery without angina pectoris: Secondary | ICD-10-CM | POA: Diagnosis present

## 2024-01-28 MED ORDER — FUROSEMIDE 40 MG PO TABS: 40.0000 mg | ORAL_TABLET | Freq: Every day | ORAL | 0 refills | Status: DC

## 2024-01-28 NOTE — Patient Instructions (Signed)
 Medication Instructions: Your physician recommends the following medication changes.  INCREASE: Lasix  to 40 mg daily   *If you need a refill on your cardiac medications before your next appointment, please call your pharmacy*  Lab Work: Your provider would like for you to return in 10 days or on 02/07/2024 to have the following labs drawn: bmp.   Please go to [facility] [address] ([facility] [address], [place] [address] You do not need an appointment.  They are open from 8 am- 4:30 pm.  Lunch from 1:00 pm- 2:00 pm You do not need to be fasting.  If you have labs (blood work) drawn today and your tests are completely normal, you will receive your results only by: MyChart Message (if you have MyChart) OR A paper copy in the mail If you have any lab test that is abnormal or we need to change your treatment, we will call you to review the results.  Testing/Procedures: No test ordered today   Follow-Up: At [facility], you and your health needs are our priority.  As part of our continuing mission to provide you with exceptional heart care, our providers are all part of one team.  This team includes your primary Cardiologist (physician) and Advanced Practice Providers or APPs (Physician Assistants and Nurse Practitioners) who all work together to provide you with the care you need, when you need it.  Your next appointment:   4 month(s)  Provider:   You may see Constancia Delton, MD or one of the following Advanced Practice Providers on your designated Care Team:   Laneta Pintos, NP Gildardo Labrador, PA-C Varney Gentleman, PA-C Cadence McCloud, PA-C Ronald Cockayne, NP Morey Ar, NP    We recommend signing up for the patient portal called "MyChart".  Sign up information is provided on this After Visit Summary.  MyChart is used to connect with patients for Virtual Visits (Telemedicine).  Patients are able to view lab/test results, encounter notes,  upcoming appointments, etc.  Non-urgent messages can be sent to your provider as well.   To learn more about what you can do with MyChart, go to [email or web address].

## 2024-01-28 NOTE — Progress Notes (Addendum)
 Name: Amy [name] MRN: [id number] DOB: [birth year] \ Patient diagnosed with sleep apnea in 2020 After further investigation with home sleep test and split-night sleep study AHI was 37   CHIEF COMPLAINT:  Follow-up assessment for OSA     HISTORY OF PRESENT ILLNESS: Follow up OSA assessment Patient uses and benefits from therapy Using CPAP nightly and with naps Pressure setting is comfortable and is sleeping well.  CPAP compliance report reviewed in detail with patient Has excellent usage hours of almost 7 hours However she has several days where she forgets to put her CPAP machine I have relayed to her that she is at a high risk for cardiac problems and respiratory problems due to noncompliance Have educated her that her AHI is 37 However with the compliance of CPAP her AHI is 1.6 Auto CPAP 5-10   No exacerbation at this time No evidence of heart failure at this time No evidence or signs of infection at this time No respiratory distress No fevers, chills, nausea, vomiting, diarrhea No evidence of lower extremity edema No evidence hemoptysis   PAST MEDICAL HISTORY :   has a past medical history of Abdominal pain of unknown etiology (02/05/2016), Anxiety, Arthritis, Asthma, Chronic back pain, Complication of anesthesia, Constipation, Depression, Dysphonia, Esophageal dysmotility, Fibromyalgia, Fibromyalgia, GERD (gastroesophageal reflux disease), Glaucoma, Headache, High cholesterol, History of DVT (deep vein thrombosis) (1989), History of hiatal hernia, History of MRSA infection (2011), Hypertension, Hypothyroidism, Irregular heart rate, Low iron, Lumbago, Mild obstructive sleep apnea, Neuropathy, Pelvic pain in female, Peripheral vascular disease (HCC), SBO (small bowel obstruction) (HCC) (06/2016), Seasonal allergies, Short of breath on exertion, Urinary frequency, Vaginal atrophy, and Weakness.  has a past surgical history that includes Abdominal hysterectomy  (1978); Nose surgery (2007); Mass excision (Left, 02/20/2013); Esophagogastroduodenoscopy (N/A, 07/15/2013); abdominal adhesions removed; Colonoscopy; Bunionectomy (Left, 2008); Shoulder open rotator cuff repair (Left, 05/05/2014); Esophageal manometry (N/A, 11/22/2014); Lumbar fusion (2014); REMOVAL HARDWARE L4-L5/  BILATERAL LAMINECTOMY L2 - L5 AND FUSION (06-12-2011); Laparoscopic cholecystectomy (01-11-2006); cysto with hydrodistension (N/A, 07/12/2015); Eye surgery (Bilateral); Tonsillectomy; Hardware Removal (N/A, 01/02/2016); Spine surgery; laparoscopy (N/A, 07/06/2016); laparotomy (N/A, 07/06/2016); Back surgery; Colon surgery; Dilation and curettage of uterus; lipoma removal; Exploratory laparotomy; ingrown toenail removal; and Cataract extraction (Bilateral, 2017). Prior to Admission medications   Medication Sig Start Date End Date Taking? Authorizing Provider  acetaminophen  (TYLENOL ) 500 MG tablet Take 1,000 mg by mouth every 6 (six) hours as needed for moderate pain.     [provider]  Ascorbic Acid (VITAMIN C) 100 MG tablet Take 100 mg by mouth daily.    [provider]  atorvastatin  (LIPITOR) 20 MG tablet Take 1 tablet (20 mg total) by mouth daily. 01/16/21   Constancia Delton, MD  B Complex Vitamins (B COMPLEX PO) Take 1 tablet by mouth daily as needed.    [provider]  carvedilol  (COREG ) 12.5 MG tablet Take 1 tablet (12.5 mg total) by mouth 2 (two) times daily. 10/31/23 10/25/24  Constancia Delton, MD  cloNIDine  (CATAPRES ) 0.1 MG tablet Take 1 tablet (0.1 mg total) by mouth daily. 07/16/23   Constancia Delton, MD  diphenhydrAMINE  (BENADRYL ) 25 MG tablet Take 25 mg by mouth 2 (two) times daily as needed for allergies.    [provider]  estradiol  (ESTRACE ) 0.1 MG/GM vaginal cream Place 1 Applicatorful vaginally 2 (two) times a week. 08/02/16   Meuth, Brooke A, PA-C  fluticasone  (FLONASE ) 50 MCG/ACT nasal spray Place 1 spray into both nostrils daily.  08/02/16  Meuth, Brooke A, PA-C  furosemide  (LASIX ) 20 MG tablet Take 1 tablet (20 mg total) by mouth daily. 04/22/23 04/16/24  Constancia Delton, MD  gabapentin  (NEURONTIN ) 100 MG capsule Take 100 mg by mouth 3 (three) times daily. Takes  100 mg AM and 200 mg HS    [provider]  hydrALAZINE  (APRESOLINE ) 100 MG tablet Take 1 tablet (100 mg total) by mouth 2 (two) times daily. 09/05/23   Constancia Delton, MD  levothyroxine (SYNTHROID) 25 MCG tablet Take 25 mcg by mouth every morning. 02/12/22   [provider]  losartan  (COZAAR ) 100 MG tablet Take 100 mg by mouth daily. 01/07/23   [provider]  Menthol , Topical Analgesic, (ICY HOT BACK EX) Apply 1 application topically 2 (two) times daily as needed (pain).    [provider]  methocarbamol  (ROBAXIN ) 500 MG tablet Take 500 mg by mouth 4 (four) times daily as needed for muscle spasms. 03/28/21   [provider]  NIFEdipine  (PROCARDIA  XL/NIFEDICAL-XL) 90 MG 24 hr tablet Take 1 tablet (90 mg total) by mouth daily. 04/22/23   Constancia Delton, MD  pantoprazole  (PROTONIX ) 40 MG tablet Take 80 mg by mouth daily.  01/17/18   [provider]  PARoxetine  (PAXIL ) 30 MG tablet Take 30 mg by mouth daily.    [provider]  Potassium Chloride  ER 20 MEQ TBCR Take 1 tablet by mouth daily. 10/30/23   [provider]  Prenatal Vit-Fe Fumarate-FA (M-VIT PO) Take by mouth.    [provider]  Semaglutide -Weight Management (WEGOVY ) 0.25 MG/0.5ML SOAJ Inject 0.25 mg into the skin once a week. 10/31/23   Constancia Delton, MD  Semaglutide -Weight Management 0.5 MG/0.5ML SOAJ Inject 0.5 mg into the skin once a week for 28 days. 11/29/23 12/27/23  Constancia Delton, MD  Semaglutide -Weight Management 1 MG/0.5ML SOAJ Inject 1 mg into the skin once a week for 28 days. 12/28/23 01/25/24  Constancia Delton, MD  Semaglutide -Weight Management 1.7 MG/0.75ML SOAJ Inject 1.7 mg into the skin once a week for 28  days. 01/26/24 02/23/24  Constancia Delton, MD  Semaglutide -Weight Management 2.4 MG/0.75ML SOAJ Inject 2.4 mg into the skin once a week for 28 days. 02/24/24 03/23/24  Constancia Delton, MD  vitamin E  180 MG (400 UNITS) capsule Take 400 Units by mouth daily as needed.    [provider]   Allergies  Allergen Reactions   Latex Other (See Comments)    Sores, blisters - reaction to gloves   Crestor [Rosuvastatin Calcium ]     Myalgias   Penicillins Other (See Comments)    UNSPECIFIED REACTION  "Has taken amoxicillin and ampicillin with no reaction" Has patient had a PCN reaction causing immediate rash, facial/tongue/throat swelling, SOB or lightheadedness with hypotension: Unknown Has patient had a PCN reaction causing severe rash involving mucus membranes or skin necrosis: Unknown Has patient had a PCN reaction that required hospitalization: Unknown Has patient had a PCN reaction occurring within the last 10 years: No    Tetanus Toxoids Rash   Tomato Rash    FAMILY HISTORY:  family history includes Aneurysm in her maternal grandmother; Cancer in her daughter, mother, and sister; Colon cancer in her father; Colon polyps in her father; Diabetes in her mother; Hypertension in her brother, brother, father, mother, and sister; Kidney disease in her brother and mother; Liver cancer in her brother and brother; Osteoarthritis in her mother; Stroke in her sister. SOCIAL HISTORY:  reports that she quit smoking about 54 years ago. Her smoking  use included cigarettes. She started smoking about 59 years ago. She has a 1.3 pack-year smoking history. She has never used smokeless tobacco. She reports that she does not currently use alcohol . She reports that she does not use drugs.  BP 134/80 (BP Location: Right Arm, Patient Position: Sitting, Cuff Size: Large)   Pulse 79   Temp 98.4 F (36.9 C) (Oral)   Ht 5\' 7"  (1.702 m)   Wt 276 lb (125.2 kg)   SpO2 98%   BMI 43.23 kg/m    Review of  Systems: Gen:  Denies  fever, sweats, chills weight loss  HEENT: Denies blurred vision, double vision, ear pain, eye pain, hearing loss, nose bleeds, sore throat Cardiac:  No dizziness, chest pain or heaviness, chest tightness,edema, No JVD Resp:   No cough, -sputum production, -shortness of breath,-wheezing, -hemoptysis,  Other:  All other systems negative   Physical Examination:   General Appearance: No distress  EYES PERRLA, EOM intact.   NECK Supple, No JVD Pulmonary: normal breath sounds, No wheezing.  CardiovascularNormal S1,S2.  +MURMUR Abdomen: Benign, Soft, non-tender. Neurology UE/LE 5/5 strength, no focal deficits Ext pulses intact, cap refill intact ALL OTHER ROS ARE NEGATIVE    ASSESSMENT AND PLAN SYNOPSIS 78 year old pleasant African-American female seen today for follow-up assessment for severe OSA with AHI of 37 in the setting of morbid obesity and deconditioned state   Assessment of OSA Previous AHI  Continue CPAP as prescribed  Excellent compliance report Reviewed compliance report in detail with patient Patient definitely benefits the use of CPAP therapy as prescribed Using CPAP nightly and with naps Pressure setting is comfortable and is sleeping well. CPAP prescription 5-10 AHI reduced to 1.6  No evidence of acute heart failure at this time No respiratory distress No fevers, chills, nausea, vomiting, diarrhea No evidence hemoptysis  Patient Instructions Continue to use CPAP every night, minimum of 4-6 hours a night.  Change equipment every 30 days or as directed by DME.  Wash your tubing with warm soap and water  daily, hang to dry. Wash humidifier portion weekly. Use bottled, distilled water  and change daily   Be aware of reduced alertness and do not drive or operate heavy machinery if experiencing this or drowsiness.  Exercise encouraged, as tolerated. Encouraged proper weight management.  Important to get eight or more hours of sleep   Limiting the use of the computer and television before bedtime.  Decrease naps during the day, so night time sleep will become enhanced.  Limit caffeine, and sleep deprivation.  HTN, stroke, uncontrolled diabetes and heart failure are potential risk factors.  Risk of untreated sleep apnea including cardiac arrhthymias, stroke, DM, pulm HTN.   Obesity -recommend significant weight loss -recommend changing diet  Deconditioned state -Recommend increased daily activity and exercise   MEDICATION ADJUSTMENTS/LABS AND TESTS ORDERED: Continue CPAP as prescribed Avoid Allergens and Irritants Avoid secondhand smoke Avoid SICK contacts Recommend  Masking  when appropriate Recommend Keep up-to-date with vaccinations   CURRENT MEDICATIONS REVIEWED AT LENGTH WITH PATIENT TODAY   Patient  satisfied with Plan of action and management. All questions answered   Follow-up in 1 year   I spent a total of 40 minutes reviewing chart data, face-to-face evaluation with the patient, counseling and coordination of care as detailed above.      Lady Pier, M.D.  [facility]  Medical Director [facility] Medical Director [facility]

## 2024-01-28 NOTE — Progress Notes (Signed)
 Cardiology Office Note:    Date:  01/28/2024   ID:  KYNNEDI DER, DOB 1946-03-08, MRN [id number]  PCP:  Elester Grim, MD   [facility]  Cardiologist:  Constancia Delton, MD  Advanced Practice Provider:  No care team member to display Electrophysiologist:  None       Referring MD: Elester Grim, MD   Chief Complaint  Patient presents with   Follow-up    3-4 month follow up visit. Patient states that she experiences some swelling, but is doing better with the shortness of breath. Meds reviewed.     History of Present Illness:    Amy [name] is a 78 y.o. 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  Being seen for morbid obesity, insurance did not approve Wegovy .  Has cut back on calorie, lost about 10 to 12 pounds since last visit.  States breathing is better with weight loss.  Still complains of leg edema, Lasix  20 mg daily not adequate.   Prior notes Cardiac monitor 6/24 1 episode of paroxysmal SVT. Coronary CTA 10/2022, mild LAD stenosis 25% Lexiscan  Myoview  01/2021 no evidence for ischemia, low risk study. Echo 02/07/2021 normal systolic function, EF 60 to 65%, Echo 06/2016 showed normal systolic function, EF 60 to 65%.  Impaired relaxation.  Past Medical History:  Diagnosis Date   Abdominal pain of unknown etiology 02/05/2016   Anxiety    Arthritis    ddd- RA   Asthma    "sleeping asthma"   Chronic back pain    lumbar steroid injection's   Complication of anesthesia    Hard to wake up. Pt sts is took 3 days.   Constipation    Depression    Dysphonia    intermittent "voice changes"   Esophageal dysmotility    Fibromyalgia    Fibromyalgia    GERD (gastroesophageal reflux disease)    Glaucoma    Headache    High cholesterol    ? pt states her doctor told her to  continue eating vegetables & fruit   History of DVT (deep vein thrombosis) 1989   LEFT UPPER ARM   History of hiatal hernia    History of MRSA infection 2011   Hypertension    Hypothyroidism    Irregular heart rate    "years ago"   Low iron    Lumbago    Mild obstructive sleep apnea    per study 02-07-2006 - no cpap   Neuropathy    feet   Pelvic pain in female    Peripheral vascular disease (HCC)    "poor circulation"   SBO (small bowel obstruction) (HCC) 06/2016   Seasonal allergies    Short of breath on exertion    Urinary frequency    Vaginal atrophy    Weakness    both hands and feet    Past Surgical History:  Procedure Laterality Date   abdominal adhesions removed     ABDOMINAL HYSTERECTOMY  1978   BACK SURGERY     BUNIONECTOMY Left 2008   CATARACT EXTRACTION Bilateral 2017   COLON SURGERY     COLONOSCOPY     CYSTO WITH HYDRODISTENSION N/A 07/12/2015   Procedure: CYSTOSCOPY/HYDRODISTENSION;  Surgeon: Erman Hayward, MD;  [facility];  Service: Urology;  Laterality: N/A;   DILATION AND CURETTAGE OF UTERUS  ESOPHAGEAL MANOMETRY N/A 11/22/2014   Procedure: ESOPHAGEAL MANOMETRY (EM);  Surgeon: Venson Ginger., MD;  Location: [facility];  Service: Endoscopy;  Laterality: N/A;   ESOPHAGOGASTRODUODENOSCOPY N/A 07/15/2013   Procedure: ESOPHAGOGASTRODUODENOSCOPY (EGD);  Surgeon: Venson Ginger., MD;  Location: [facility];  Service: Endoscopy;  Laterality: N/A;  need xray   EXPLORATORY LAPAROTOMY     EYE SURGERY Bilateral    cataract surgery with lens implants   HARDWARE REMOVAL N/A 01/02/2016   Procedure: Exploration of Lumbar Fusion,Removal of hardware Lumbar One-Two ;Redo Posterior Lumbar Fusion Lumbar One-Two;  Surgeon: Gearl Keens, MD;  Location: [facility];  Service: Neurosurgery;  Laterality: N/A;   ingrown toenail removal     LAPAROSCOPIC CHOLECYSTECTOMY  01-11-2006   LAPAROSCOPY N/A 07/06/2016   Procedure: LAPAROSCOPY  DIAGNOSTIC EMERGENT TO OPEN;  Surgeon: Aldean Hummingbird, MD;  Location: [facility] OR;  Service: General;  Laterality: N/A;   LAPAROTOMY N/A 07/06/2016   Procedure: EXPLORATORY LAPAROTOMY LYSIS OF ADHESIONS FOR 3 HOURS;  Surgeon: Aldean Hummingbird, MD;  Location: [facility] OR;  Service: General;  Laterality: N/A;   lipoma removal     LUMBAR FUSION  2014   L4 -- L5   MASS EXCISION Left 02/20/2013   Procedure: EXCISION LEFT BUTTOCK  MASS;  Surgeon: Eddye Goodie, MD;  Location: [facility];  Service: General;  Laterality: Left;   NOSE SURGERY  2007   REMOVAL HARDWARE L4-L5/  BILATERAL LAMINECTOMY L2 - L5 AND FUSION  06-12-2011   SHOULDER OPEN ROTATOR CUFF REPAIR Left 05/05/2014   Procedure: OPEN ACROMIONECTOMY AND OPEN REPAIR OF ROTATOR CUFF, TISSUEMEND GRAFT WITH ANCHOR ;  Surgeon: Florencia Hunter, MD;  Location: [facility];  Service: Orthopedics;  Laterality: Left;   SPINE SURGERY     x6   TONSILLECTOMY      Current Medications: Current Meds  Medication Sig   acetaminophen  (TYLENOL ) 500 MG tablet Take 1,000 mg by mouth every 6 (six) hours as needed for moderate pain.    Ascorbic Acid (VITAMIN C) 100 MG tablet Take 100 mg by mouth daily.   atorvastatin  (LIPITOR) 20 MG tablet Take 1 tablet (20 mg total) by mouth daily.   B Complex Vitamins (B COMPLEX PO) Take 1 tablet by mouth daily as needed.   carvedilol  (COREG ) 12.5 MG tablet Take 1 tablet (12.5 mg total) by mouth 2 (two) times daily.   cloNIDine  (CATAPRES ) 0.1 MG tablet Take 1 tablet (0.1 mg total) by mouth daily.   diphenhydrAMINE  (BENADRYL ) 25 MG tablet Take 25 mg by mouth 2 (two) times daily as needed for allergies.   estradiol  (ESTRACE ) 0.1 MG/GM vaginal cream Place 1 Applicatorful vaginally 2 (two) times a week.   fluticasone  (FLONASE ) 50 MCG/ACT nasal spray Place 1 spray into both nostrils daily.   gabapentin  (NEURONTIN ) 100 MG capsule Take 100 mg by mouth 3 (three) times daily. Takes  100 mg AM and 200 mg HS   hydrALAZINE  (APRESOLINE ) 100 MG tablet Take 1  tablet (100 mg total) by mouth 2 (two) times daily.   KLOR-CON  M20 20 MEQ tablet Take 20 mEq by mouth daily.   levothyroxine (SYNTHROID) 25 MCG tablet Take 25 mcg by mouth every morning.   losartan  (COZAAR ) 100 MG tablet Take 100 mg by mouth daily.   Menthol , Topical Analgesic, (ICY HOT BACK EX) Apply 1 application topically 2 (two) times daily as needed (pain).   methocarbamol  (ROBAXIN ) 500 MG tablet Take 500 mg by mouth 4 (four) times daily as needed  for muscle spasms.   NIFEdipine  (PROCARDIA  XL/NIFEDICAL-XL) 90 MG 24 hr tablet Take 1 tablet (90 mg total) by mouth daily.   pantoprazole  (PROTONIX ) 40 MG tablet Take 80 mg by mouth daily.    PARoxetine  (PAXIL ) 30 MG tablet Take 30 mg by mouth daily.   Potassium Chloride  ER 20 MEQ TBCR Take 1 tablet by mouth daily.   traMADol  (ULTRAM ) 50 MG tablet Take by mouth.   vitamin E  180 MG (400 UNITS) capsule Take 400 Units by mouth daily as needed.   [DISCONTINUED] furosemide  (LASIX ) 20 MG tablet Take 1 tablet (20 mg total) by mouth daily.     Allergies:   Latex, Crestor [rosuvastatin calcium ], Penicillins, Tetanus toxoids, and Tomato   Social History   Socioeconomic History   Marital status: Married    Spouse name: Not on file   Number of children: 1   Years of education: Not on file   Highest education level: Master's degree (e.g., MA, MS, MEng, MEd, MSW, MBA)  Occupational History   Not on file  Tobacco Use   Smoking status: Former    Current packs/day: 0.00    Average packs/day: 0.3 packs/day for 5.0 years (1.3 ttl pk-yrs)    Types: Cigarettes    Start date: 07/10/1964    Quit date: 07/10/1969    Years since quitting: 54.5   Smokeless tobacco: Never  Vaping Use   Vaping status: Never Used  Substance and Sexual Activity   Alcohol  use: Not Currently    Comment: rare, social   Drug use: No   Sexual activity: Not on file  Other Topics Concern   Not on file  Social History Narrative   Lives at home with husband    Right handed    Caffeine: max 2 cups coffee per day but not does not drink daily.   Social Drivers of Corporate investment banker Strain: Not on file  Food Insecurity: Not on file  Transportation Needs: Not on file  Physical Activity: Not on file  Stress: Not on file  Social Connections: Not on file     Family History: The patient's family history includes Aneurysm in her maternal grandmother; Cancer in her daughter, mother, and sister; Colon cancer in her father; Colon polyps in her father; Diabetes in her mother; Hypertension in her brother, brother, father, mother, and sister; Kidney disease in her brother and mother; Liver cancer in her brother and brother; Osteoarthritis in her mother; Stroke in her sister. There is no history of Migraines or Headache.  ROS:   Please see the history of present illness.     All other systems reviewed and are negative.  EKGs/Labs/Other Studies Reviewed:    The following studies were reviewed today:        Recent Labs: 04/08/2023: BUN 23; Creatinine, Ser 1.17; Potassium 4.6; Sodium 139  Recent Lipid Panel    Component Value Date/Time   CHOL 165 07/03/2016 0627   TRIG 148 07/27/2016 0415   HDL 82 07/03/2016 0627   CHOLHDL 2.0 07/03/2016 0627   VLDL 8 07/03/2016 0627   LDLCALC 75 07/03/2016 0627     Risk Assessment/Calculations:      Physical Exam:    VS:  BP (!) 148/85   Pulse 81   Ht 5\' 7"  (1.702 m)   Wt 277 lb (125.6 kg)   SpO2 96%   BMI 43.38 kg/m     Wt Readings from Last 3 Encounters:  01/28/24 276 lb (125.2 kg)  01/28/24  277 lb (125.6 kg)  12/02/23 280 lb 9.6 oz (127.3 kg)     GEN:  Well nourished, well developed in no acute distress HEENT: Normal NECK: No JVD; No carotid bruits CARDIAC: RRR, no murmurs, rubs, gallops RESPIRATORY:  Clear to auscultation without rales, wheezing or rhonchi  ABDOMEN: Soft, non-tender, non-distended MUSCULOSKELETAL:  trace edema; No deformit SKIN: Warm and dry NEUROLOGIC:  Alert and oriented x  3 PSYCHIATRIC:  Normal affect   ASSESSMENT:    1. Primary hypertension   2. Paroxysmal SVT (supraventricular tachycardia) (HCC)   3. Coronary artery disease involving native coronary artery of native heart, unspecified whether angina present   4. Morbid obesity (HCC)   5. Medication management     PLAN:    In order of problems listed above:  Hypertension, BP slightly elevated, previously controlled.  Continue nifedipine  90 mg daily, losartan  100 mg daily, hydralazine  100 mg 3 times daily,Coreg  12.5 mg twice daily, clonidine  to 0.1 mg nightly.  Hoping BP will improve with eating healthier and weight loss. Palpitations, occasional paroxysmal SVT.  Controlled on beta-blocker.  Continue Coreg  12.5 mg twice daily as above. Mild Nonobstructive CAD, ( proximal LAD stenosis 25%).  Denies chest pain. LDL at goal, continue Lipitor 20 mg daily.. morbid obesity.  Lost 12 pounds with cutting back on carbs/sweets.  1+ edema.  Increase Lasix  to 40 mg daily.  Check BMP in 10 days.    A lot of her symptoms can be attributed to morbid obesity.  Follow-up in 5 months   Medication Adjustments/Labs and Tests Ordered: Current medicines are reviewed at length with the patient today.  Concerns regarding medicines are outlined above.  Orders Placed This Encounter  Procedures   Basic Metabolic Panel (BMET)    Meds ordered this encounter  Medications   furosemide  (LASIX ) 40 MG tablet    Sig: Take 1 tablet (40 mg total) by mouth daily.    Dispense:  90 tablet    Refill:  0     Patient Instructions  Medication Instructions: Your physician recommends the following medication changes.  INCREASE: Lasix  to 40 mg daily   *If you need a refill on your cardiac medications before your next appointment, please call your pharmacy*  Lab Work: Your provider would like for you to return in 10 days or on 02/07/2024 to have the following labs drawn: bmp.   Please go to [facility] [address]  ([facility] [address], [place] [address] You do not need an appointment.  They are open from 8 am- 4:30 pm.  Lunch from 1:00 pm- 2:00 pm You do not need to be fasting.  If you have labs (blood work) drawn today and your tests are completely normal, you will receive your results only by: MyChart Message (if you have MyChart) OR A paper copy in the mail If you have any lab test that is abnormal or we need to change your treatment, we will call you to review the results.  Testing/Procedures: No test ordered today   Follow-Up: At [facility], you and your health needs are our priority.  As part of our continuing mission to provide you with exceptional heart care, our providers are all part of one team.  This team includes your primary Cardiologist (physician) and Advanced Practice Providers or APPs (Physician Assistants and Nurse Practitioners) who all work together to provide you with the care you need, when you need it.  Your next appointment:   4 month(s)  Provider:   You may see Constancia Delton, MD or one of the following Advanced Practice Providers on your designated Care Team:   Laneta Pintos, NP Gildardo Labrador, PA-C Varney Gentleman, PA-C Cadence [place], PA-C Ronald Cockayne, NP Morey Ar, NP    We recommend signing up for the patient portal called "MyChart".  Sign up information is provided on this After Visit Summary.  MyChart is used to connect with patients for Virtual Visits (Telemedicine).  Patients are able to view lab/test results, encounter notes, upcoming appointments, etc.  Non-urgent messages can be sent to your provider as well.   To learn more about what you can do with MyChart, go to [email or web address].      Signed, Constancia Delton, MD  01/28/2024 10:18 AM    [facility]

## 2024-01-28 NOTE — Patient Instructions (Signed)

## 2024-02-10 ENCOUNTER — Other Ambulatory Visit: Payer: Self-pay

## 2024-02-10 ENCOUNTER — Emergency Department (HOSPITAL_BASED_OUTPATIENT_CLINIC_OR_DEPARTMENT_OTHER)
Admission: EM | Admit: 2024-02-10 | Discharge: 2024-02-10 | Disposition: A | Discharge status: Home / Self Care | Attending: Emergency Medicine | Admitting: Emergency Medicine

## 2024-02-10 ENCOUNTER — Emergency Department (HOSPITAL_BASED_OUTPATIENT_CLINIC_OR_DEPARTMENT_OTHER)

## 2024-02-10 ENCOUNTER — Encounter (HOSPITAL_BASED_OUTPATIENT_CLINIC_OR_DEPARTMENT_OTHER): Payer: Self-pay | Admitting: Emergency Medicine

## 2024-02-10 DIAGNOSIS — Z91040 Latex allergy status: Secondary | ICD-10-CM | POA: Diagnosis not present

## 2024-02-10 DIAGNOSIS — F0781 Postconcussional syndrome: Secondary | ICD-10-CM | POA: Insufficient documentation

## 2024-02-10 DIAGNOSIS — R519 Headache, unspecified: Secondary | ICD-10-CM | POA: Insufficient documentation

## 2024-02-10 LAB — CBC WITH DIFFERENTIAL/PLATELET
Abs Immature Granulocytes: 0.01 10*3/uL (ref 0.00–0.07)
Basophils Absolute: 0 10*3/uL (ref 0.0–0.1)
Basophils Relative: 0 %
Eosinophils Absolute: 0.1 10*3/uL (ref 0.0–0.5)
Eosinophils Relative: 2 %
HCT: 40.3 % (ref 36.0–46.0)
Hemoglobin: 13 g/dL (ref 12.0–15.0)
Immature Granulocytes: 0 %
Lymphocytes Relative: 38 %
Lymphs Abs: 2.3 10*3/uL (ref 0.7–4.0)
MCH: 30 pg (ref 26.0–34.0)
MCHC: 32.3 g/dL (ref 30.0–36.0)
MCV: 92.9 fL (ref 80.0–100.0)
Monocytes Absolute: 0.9 10*3/uL (ref 0.1–1.0)
Monocytes Relative: 15 %
Neutro Abs: 2.7 10*3/uL (ref 1.7–7.7)
Neutrophils Relative %: 45 %
Platelets: 379 10*3/uL (ref 150–400)
RBC: 4.34 MIL/uL (ref 3.87–5.11)
RDW: 15.6 % — ABNORMAL HIGH (ref 11.5–15.5)
WBC: 6 10*3/uL (ref 4.0–10.5)
nRBC: 0 % (ref 0.0–0.2)

## 2024-02-10 LAB — URINALYSIS, W/ REFLEX TO CULTURE (INFECTION SUSPECTED)
Bilirubin Urine: NEGATIVE
Glucose, UA: NEGATIVE mg/dL
Hgb urine dipstick: NEGATIVE
Ketones, ur: NEGATIVE mg/dL
Leukocytes,Ua: NEGATIVE
Nitrite: NEGATIVE
Protein, ur: NEGATIVE mg/dL
Specific Gravity, Urine: 1.009 (ref 1.005–1.030)
pH: 7.5 (ref 5.0–8.0)

## 2024-02-10 LAB — COMPREHENSIVE METABOLIC PANEL WITH GFR
ALT: 24 U/L (ref 0–44)
AST: 29 U/L (ref 15–41)
Albumin: 4.2 g/dL (ref 3.5–5.0)
Alkaline Phosphatase: 144 U/L — ABNORMAL HIGH (ref 38–126)
Anion gap: 11 (ref 5–15)
BUN: 18 mg/dL (ref 8–23)
CO2: 28 mmol/L (ref 22–32)
Calcium: 9.6 mg/dL (ref 8.9–10.3)
Chloride: 103 mmol/L (ref 98–111)
Creatinine, Ser: 0.93 mg/dL (ref 0.44–1.00)
GFR, Estimated: >60 mL/min (ref >60)
Glucose, Bld: 94 mg/dL (ref 70–99)
Potassium: 3.7 mmol/L (ref 3.5–5.1)
Sodium: 141 mmol/L (ref 135–145)
Total Bilirubin: 0.4 mg/dL (ref 0.0–1.2)
Total Protein: 7.8 g/dL (ref 6.5–8.1)

## 2024-02-10 MED ORDER — OXYCODONE-ACETAMINOPHEN 5-325 MG PO TABS
1.0000 | ORAL_TABLET | Freq: Once | ORAL | Status: AC
  Administered 2024-02-10: 1 via ORAL
  Filled 2024-02-10: qty 1

## 2024-02-10 MED ORDER — SODIUM CHLORIDE 0.9 % IV BOLUS
1000.0000 mL | Freq: Once | INTRAVENOUS | Status: AC
  Administered 2024-02-10: 1000 mL via INTRAVENOUS

## 2024-02-10 MED ORDER — ACETAMINOPHEN 325 MG PO TABS
650.0000 mg | ORAL_TABLET | Freq: Once | ORAL | Status: AC
  Administered 2024-02-10: 650 mg via ORAL
  Filled 2024-02-10: qty 2

## 2024-02-10 NOTE — ED Triage Notes (Signed)
 Mechanical fall on Friday. Hit head over floor. Denies blood thinners. Denies LOC. States increased dizziness and fatigue since fall.

## 2024-02-10 NOTE — ED Provider Notes (Signed)
 [place] EMERGENCY DEPARTMENT AT [facility] Provider Note   CSN: [id number] Arrival date & time: 02/10/24  1433     History  Chief Complaint  Patient presents with   Amy [name] is a 78 y.o. female.  Patient to ED with complaint of headache since fall 2 days ago. She reports she was at a hotel, in the tub shower and slipped getting out. She went down on her knees, hit her right thigh against the side of the tub and hit her head on the floor. She was able to get herself up and attended a meeting afterward for work. Since then, she reports a bilateral headache. She reports she looses visual focus  The history is provided by the patient. No language interpreter was used.  Fall       Home Medications Prior to Admission medications   Medication Sig Start Date End Date Taking? Authorizing Provider  acetaminophen  (TYLENOL ) 500 MG tablet Take 1,000 mg by mouth every 6 (six) hours as needed for moderate pain.     [provider]  Ascorbic Acid (VITAMIN C) 100 MG tablet Take 100 mg by mouth daily.    [provider]  atorvastatin  (LIPITOR) 20 MG tablet Take 1 tablet (20 mg total) by mouth daily. 01/16/21   Constancia Delton, MD  B Complex Vitamins (B COMPLEX PO) Take 1 tablet by mouth daily as needed.    [provider]  carvedilol  (COREG ) 12.5 MG tablet Take 1 tablet (12.5 mg total) by mouth 2 (two) times daily. 10/31/23 10/25/24  Constancia Delton, MD  cloNIDine  (CATAPRES ) 0.1 MG tablet Take 1 tablet (0.1 mg total) by mouth daily. 07/16/23   Constancia Delton, MD  diclofenac  Sodium (VOLTAREN ) 1 % GEL Apply 1 g topically. 01/23/24   [provider]  diphenhydrAMINE  (BENADRYL ) 25 MG tablet Take 25 mg by mouth 2 (two) times daily as needed for allergies.    [provider]  estradiol  (ESTRACE ) 0.1 MG/GM vaginal cream Place 1 Applicatorful vaginally 2 (two) times a week. 08/02/16   Meuth, Brooke A, PA-C  fluticasone   (FLONASE ) 50 MCG/ACT nasal spray Place 1 spray into both nostrils daily. 08/02/16   Meuth, Brooke A, PA-C  furosemide  (LASIX ) 40 MG tablet Take 1 tablet (40 mg total) by mouth daily. 01/28/24 01/22/25  Constancia Delton, MD  gabapentin  (NEURONTIN ) 100 MG capsule Take 100 mg by mouth 3 (three) times daily. Takes  100 mg AM and 200 mg HS    [provider]  hydrALAZINE  (APRESOLINE ) 100 MG tablet Take 1 tablet (100 mg total) by mouth 2 (two) times daily. 09/05/23   Constancia Delton, MD  KLOR-CON  M20 20 MEQ tablet Take 20 mEq by mouth daily. 08/11/23   [provider]  levothyroxine (SYNTHROID) 25 MCG tablet Take 25 mcg by mouth every morning. 02/12/22   [provider]  losartan  (COZAAR ) 100 MG tablet Take 100 mg by mouth daily. 01/07/23   [provider]  Menthol , Topical Analgesic, (ICY HOT BACK EX) Apply 1 application topically 2 (two) times daily as needed (pain).    [provider]  methocarbamol  (ROBAXIN ) 500 MG tablet Take 500 mg by mouth 4 (four) times daily as needed for muscle spasms. 03/28/21   [provider]  NIFEdipine  (PROCARDIA  XL/NIFEDICAL-XL) 90 MG 24 hr tablet Take 1 tablet (90 mg total) by mouth daily. 04/22/23   Constancia Delton, MD  pantoprazole  (PROTONIX ) 40 MG tablet Take 80 mg by mouth daily.  01/17/18   [provider]  PARoxetine  (PAXIL ) 30 MG tablet Take 30 mg by mouth daily.    [provider]  Potassium Chloride  ER 20 MEQ TBCR Take 1 tablet by mouth daily. 10/30/23   [provider]  traMADol  (ULTRAM ) 50 MG tablet Take by mouth. 11/15/23   [provider]  vitamin E  180 MG (400 UNITS) capsule Take 400 Units by mouth daily as needed.    [provider]      Allergies    Latex, Crestor [rosuvastatin calcium ], Penicillins, Tetanus toxoids, and Tomato    Review of Systems   Review of Systems  Physical Exam Updated Vital Signs BP (!) 178/75   Pulse 84   Temp 97.9 F (36.6 C)  (Oral)   Resp 16   SpO2 97%  Physical Exam Vitals and nursing note reviewed.  Constitutional:      Appearance: She is well-developed.  HENT:     Head: Normocephalic and atraumatic.  Eyes:     Pupils: Pupils are equal, round, and reactive to light.  Neck:     Comments: No midline cervical tenderness.  Cardiovascular:     Rate and Rhythm: Normal rate and regular rhythm.     Pulses: Normal pulses.     Heart sounds: No murmur heard. Pulmonary:     Effort: Pulmonary effort is normal.     Breath sounds: Normal breath sounds. No wheezing, rhonchi or rales.  Chest:     Chest wall: No tenderness.  Abdominal:     Palpations: Abdomen is soft.     Tenderness: There is no abdominal tenderness. There is no guarding or rebound.  Musculoskeletal:        General: Normal range of motion.     Cervical back: Normal range of motion and neck supple.     Comments: Generalized joint tenderness with history of arthritis.   Skin:    General: Skin is warm and dry.     Findings: Bruising (Right medial thigh.) present.  Neurological:     General: No focal deficit present.     Mental Status: She is alert and oriented to person, place, and time.     ED Results / Procedures / Treatments   Labs (all labs ordered are listed, but only abnormal results are displayed) Labs Reviewed  CBC WITH DIFFERENTIAL/PLATELET - Abnormal; Notable for the following components:      Result Value   RDW 15.6 (*)    All other components within normal limits  COMPREHENSIVE METABOLIC PANEL WITH GFR - Abnormal; Notable for the following components:   Alkaline Phosphatase 144 (*)    All other components within normal limits  URINALYSIS, W/ REFLEX TO CULTURE (INFECTION SUSPECTED) - Abnormal; Notable for the following components:   Bacteria, UA MANY (*)    All other components within normal limits   Results for orders placed or performed during the hospital encounter of 02/10/24  CBC with Differential   Collection Time:  02/10/24  7:01 PM  Result Value Ref Range   WBC 6.0 4.0 - 10.5 K/uL   RBC 4.34 3.87 - 5.11 MIL/uL   Hemoglobin 13.0 12.0 - 15.0 g/dL   HCT 16.1 09.6 - 04.5 %   MCV 92.9 80.0 - 100.0 fL   MCH 30.0 26.0 - 34.0 pg   MCHC 32.3 30.0 - 36.0 g/dL   RDW 40.9 (H) 81.1 - 91.4 %   Platelets 379 150 - 400 K/uL   nRBC 0.0 0.0 - 0.2 %  Neutrophils Relative % 45 %   Neutro Abs 2.7 1.7 - 7.7 K/uL   Lymphocytes Relative 38 %   Lymphs Abs 2.3 0.7 - 4.0 K/uL   Monocytes Relative 15 %   Monocytes Absolute 0.9 0.1 - 1.0 K/uL   Eosinophils Relative 2 %   Eosinophils Absolute 0.1 0.0 - 0.5 K/uL   Basophils Relative 0 %   Basophils Absolute 0.0 0.0 - 0.1 K/uL   Immature Granulocytes 0 %   Abs Immature Granulocytes 0.01 0.00 - 0.07 K/uL  Comprehensive metabolic panel   Collection Time: 02/10/24  7:01 PM  Result Value Ref Range   Sodium 141 135 - 145 mmol/L   Potassium 3.7 3.5 - 5.1 mmol/L   Chloride 103 98 - 111 mmol/L   CO2 28 22 - 32 mmol/L   Glucose, Bld 94 70 - 99 mg/dL   BUN 18 8 - 23 mg/dL   Creatinine, Ser 2.35 0.44 - 1.00 mg/dL   Calcium  9.6 8.9 - 10.3 mg/dL   Total Protein 7.8 6.5 - 8.1 g/dL   Albumin  4.2 3.5 - 5.0 g/dL   AST 29 15 - 41 U/L   ALT 24 0 - 44 U/L   Alkaline Phosphatase 144 (H) 38 - 126 U/L   Total Bilirubin 0.4 0.0 - 1.2 mg/dL   GFR, Estimated >57 >32 mL/min   Anion gap 11 5 - 15  Urinalysis, w/ Reflex to Culture (Infection Suspected) -Urine, Catheterized   Collection Time: 02/10/24  8:00 PM  Result Value Ref Range   Specimen Source URINE, CATHETERIZED    Color, Urine YELLOW YELLOW   APPearance CLEAR CLEAR   Specific Gravity, Urine 1.009 1.005 - 1.030   pH 7.5 5.0 - 8.0   Glucose, UA NEGATIVE NEGATIVE mg/dL   Hgb urine dipstick NEGATIVE NEGATIVE   Bilirubin Urine NEGATIVE NEGATIVE   Ketones, ur NEGATIVE NEGATIVE mg/dL   Protein, ur NEGATIVE NEGATIVE mg/dL   Nitrite NEGATIVE NEGATIVE   Leukocytes,Ua NEGATIVE NEGATIVE   RBC / HPF 0-5 0 - 5 RBC/hpf   WBC, UA 0-5  0 - 5 WBC/hpf   Bacteria, UA MANY (A) NONE SEEN   Squamous Epithelial / HPF 11-20 0 - 5 /HPF     EKG None  Radiology DG Chest Portable 1 View Result Date: 02/10/2024 CLINICAL DATA:  Weakness EXAM: PORTABLE CHEST 1 VIEW COMPARISON:  07/06/2021 FINDINGS: The heart size and mediastinal contours are within normal limits. Both lungs are clear. The visualized skeletal structures are unremarkable. Aortic atherosclerosis. IMPRESSION: No active disease. Electronically Signed   By: Esmeralda Hedge M.D.   On: 02/10/2024 21:36   CT Head Wo Contrast Result Date: 02/10/2024 CLINICAL DATA:  Fall on Friday EXAM: CT HEAD WITHOUT CONTRAST CT CERVICAL SPINE WITHOUT CONTRAST TECHNIQUE: Multidetector CT imaging of the head and cervical spine was performed following the standard protocol without intravenous contrast. Multiplanar CT image reconstructions of the cervical spine were also generated. RADIATION DOSE REDUCTION: This exam was performed according to the departmental dose-optimization program which includes automated exposure control, adjustment of the mA and/or kV according to patient size and/or use of iterative reconstruction technique. COMPARISON:  09/27/2023 FINDINGS: CT HEAD FINDINGS Brain: No evidence of acute infarction, hemorrhage, hydrocephalus, extra-axial collection or mass lesion/mass effect. Mild periventricular white matter hypodensity. Vascular: No hyperdense vessel or unexpected calcification. Skull: Normal. Negative for fracture or focal lesion. Sinuses/Orbits: No acute finding. Other: None. CT CERVICAL SPINE FINDINGS Alignment: Normal. Skull base and vertebrae: No acute fracture. No primary  bone lesion or focal pathologic process. Soft tissues and spinal canal: No prevertebral fluid or swelling. No visible canal hematoma. Disc levels:  Intact. Upper chest: Negative. Other: None. IMPRESSION: 1. No acute intracranial pathology. 2. No fracture or static subluxation of the cervical spine. Electronically  Signed   By: Fredricka Jenny M.D.   On: 02/10/2024 16:31   CT Cervical Spine Wo Contrast Result Date: 02/10/2024 CLINICAL DATA:  Fall on Friday EXAM: CT HEAD WITHOUT CONTRAST CT CERVICAL SPINE WITHOUT CONTRAST TECHNIQUE: Multidetector CT imaging of the head and cervical spine was performed following the standard protocol without intravenous contrast. Multiplanar CT image reconstructions of the cervical spine were also generated. RADIATION DOSE REDUCTION: This exam was performed according to the departmental dose-optimization program which includes automated exposure control, adjustment of the mA and/or kV according to patient size and/or use of iterative reconstruction technique. COMPARISON:  09/27/2023 FINDINGS: CT HEAD FINDINGS Brain: No evidence of acute infarction, hemorrhage, hydrocephalus, extra-axial collection or mass lesion/mass effect. Mild periventricular white matter hypodensity. Vascular: No hyperdense vessel or unexpected calcification. Skull: Normal. Negative for fracture or focal lesion. Sinuses/Orbits: No acute finding. Other: None. CT CERVICAL SPINE FINDINGS Alignment: Normal. Skull base and vertebrae: No acute fracture. No primary bone lesion or focal pathologic process. Soft tissues and spinal canal: No prevertebral fluid or swelling. No visible canal hematoma. Disc levels:  Intact. Upper chest: Negative. Other: None. IMPRESSION: 1. No acute intracranial pathology. 2. No fracture or static subluxation of the cervical spine. Electronically Signed   By: Fredricka Jenny M.D.   On: 02/10/2024 16:31    Procedures Procedures    Medications Ordered in ED Medications  oxyCODONE -acetaminophen  (PERCOCET/ROXICET) 5-325 MG per tablet 1 tablet (1 tablet Oral Given 02/10/24 1801)  sodium chloride  0.9 % bolus 1,000 mL (0 mLs Intravenous Stopped 02/10/24 2102)  acetaminophen  (TYLENOL ) tablet 650 mg (650 mg Oral Given 02/10/24 2217)    ED Course/ Medical Decision Making/ A&P                                  Medical Decision Making This patient presents to the ED for concern of headache, this involves an extensive number of treatment options, and is a complaint that carries with it a high risk of complications and morbidity.  The differential diagnosis includes SDH/SAH, mass, concussion, CVA, meningitis   Co morbidities that complicate the patient evaluation  Recent fall, chronic back pain, fibromyalgia, HTN, GERD, arthritis, PVD, OSA, asthma, SBO,    Additional history obtained:  Additional history and/or information obtained from chart review, notable for Husband at bedside   Lab Tests:  I Ordered, and personally interpreted labs.  The pertinent results include:   UA - many bacteria (contaminated), WBC 0-5 CMET - no electrolyte abnormalities, normal renal function CBC - no leukocytosis, normal hgb, normal plts    Imaging Studies ordered:  I ordered imaging studies including Head and neck CT Per radiologist interpretation:  IMPRESSION: 1. No acute intracranial pathology. 2. No fracture or static subluxation of the cervical spine.       Medicines ordered and prescription drug management:  I ordered medication including oxycodone   for pain Reevaluation of the patient after these medicines showed that the patient improved I have reviewed the patients home medicines and have made adjustments as needed   Test Considered:  N/a   Critical Interventions:  N/a   Consultations Obtained:  I requested consultation with  the n/a,  and discussed lab and imaging findings as well as pertinent plan - they recommend: n/a   Problem List / ED Course:  Patient with recent fall, hit her head but normally functioning afterward - attended meetings, drove home from [place] . Reports persistent generalized weakness and headache.  Exam reassuring. Not anticoagulated. Head and Neck CT unremarkable - no bleed or evidence of injury.  She continues to report generalized weakness -  doesn't feel well. Additional work up initiated showing normal labs, Vitals remain stable, no change in mental status She is able to ambulate Seen by Dr. Adrain Alar.  Stable for discharge home dx post-concussive syndrome    Reevaluation:  After the interventions noted above, I reevaluated the patient and found that they have :stayed the same   Social Determinants of Health:  Lives with husband   Disposition:  After consideration of the diagnostic results and the patients response to treatment, I feel that the patient would benefit from discharge home.   Amount and/or Complexity of Data Reviewed Labs: ordered. Radiology: ordered.  Risk OTC drugs. Prescription drug management.           Final Clinical Impression(s) / ED Diagnoses Final diagnoses:  Post concussion syndrome    Rx / DC Orders ED Discharge Orders     None         Mandy Second, PA-C 02/11/24 1446    Rosealee Concha, MD 02/11/24 1459

## 2024-02-10 NOTE — Discharge Instructions (Signed)
 As we discussed, your labs and CT of your head do not show any infection or abnormality that should cause your symptoms. You likely have a concussion after the fall 2 days ago and you should improve over time.   Follow up with your doctor for recheck in 1 week. Return to the ED with any new or concerning symptoms at any time.

## 2024-02-12 ENCOUNTER — Ambulatory Visit: Admitting: Podiatry

## 2024-02-12 ENCOUNTER — Telehealth: Payer: Self-pay | Admitting: Podiatry

## 2024-02-12 NOTE — Telephone Encounter (Signed)
 Patient missed her 9:15 am appointment because she fell and hit her head. Feet are in a lot if pain. Is it okay to schedule her this afternoon?

## 2024-02-21 ENCOUNTER — Ambulatory Visit: Payer: Medicare Other | Admitting: Neurology

## 2024-03-23 ENCOUNTER — Ambulatory Visit (INDEPENDENT_AMBULATORY_CARE_PROVIDER_SITE_OTHER): Admitting: Podiatry

## 2024-03-23 DIAGNOSIS — I872 Venous insufficiency (chronic) (peripheral): Secondary | ICD-10-CM

## 2024-03-23 DIAGNOSIS — M7989 Other specified soft tissue disorders: Secondary | ICD-10-CM | POA: Diagnosis not present

## 2024-03-23 MED ORDER — GABAPENTIN 300 MG PO CAPS: 300.0000 mg | ORAL_CAPSULE | Freq: Two times a day (BID) | ORAL | 3 refills | Status: DC

## 2024-03-23 NOTE — Progress Notes (Signed)
 For follow-up of swelling and pain in her legs and feet.  She was seen by cardiology and her venous studies.  She also says that her cardiologist increased her gabapentin  a little bit but her primary care provider would not refill it because it was a narcotic.  She states that she still having the pain in the ankles were swelling and dark and the majority of her shooting pain symptomatology is when she is resting and her feet are elevated or she is in the bed.  Objective: Vital signs are stable oriented x 3 venous studies do demonstrate superficial saphenous vein insufficiency on the right side.  The right side does appear to be her worst side.'s pulses are strongly palpable.  She has no loss of sensation per [employer] monofilament bilateral.  Assessment venous insufficiency with edema bilateral lower extremity.  Neuropathy is present.  Plan: Discussed etiology pathology conservative surgical therapies will increase her gabapentin  to 300 mg twice daily once in the morning once at night.  Are also going to encourage her to obtain a pair of compression hose 20 to 30 mmHg which I prescribed through [facility].  I would like to follow-up with her in 6 weeks to see if she is improving.

## 2024-03-24 ENCOUNTER — Encounter: Payer: Self-pay | Admitting: Podiatry

## 2024-03-26 ENCOUNTER — Other Ambulatory Visit: Payer: Self-pay

## 2024-03-26 MED ORDER — GABAPENTIN 600 MG PO TABS: 600.0000 mg | ORAL_TABLET | Freq: Two times a day (BID) | ORAL | 2 refills | Status: DC

## 2024-04-24 ENCOUNTER — Other Ambulatory Visit: Payer: Self-pay | Admitting: Cardiology

## 2024-04-24 ENCOUNTER — Telehealth: Payer: Self-pay | Admitting: Cardiology

## 2024-04-24 MED ORDER — NIFEDIPINE ER OSMOTIC RELEASE 90 MG PO TB24: 90.0000 mg | ORAL_TABLET | Freq: Every day | ORAL | 0 refills | Status: DC

## 2024-04-24 NOTE — Telephone Encounter (Signed)
 Called and spoke with patient. Patient reports that at her last office visit with Dr. Darliss that her Procardia  was increased from 90MG  once daily to 90MG  in the AM and 180 MG in the PM. Informed patient that change was not on her most recent AVS but would forward to MD for review.   Patient also states that she has ran out of Procardia  and when she contacted the pharmacy to have it filled they had a prescription for 10 MG tablets instead of 90 MG. Called pharmacy and they said they have not received a prescription for 90 MG. Order recent to pharmacy. Called patient and notified her that pharmacy has an updated prescription for 90 MG.

## 2024-04-24 NOTE — Telephone Encounter (Signed)
 Pt c/o medication issue:  1. Name of Medication: NIFEdipine  (PROCARDIA  XL/NIFEDICAL-XL) 90 MG 24 hr tablet   2. How are you currently taking this medication (dosage and times per day)?    3. Are you having a reaction (difficulty breathing--STAT)? no  4. What is your medication issue? Patient states that the prescription is wrong. She suppose to take 1 in the morning and two at night. Please advise

## 2024-04-27 ENCOUNTER — Telehealth: Payer: Self-pay | Admitting: Cardiology

## 2024-04-27 NOTE — Telephone Encounter (Signed)
  Patient states she is returning a call from the office

## 2024-05-04 ENCOUNTER — Ambulatory Visit (INDEPENDENT_AMBULATORY_CARE_PROVIDER_SITE_OTHER): Admitting: Podiatry

## 2024-05-04 DIAGNOSIS — L03115 Cellulitis of right lower limb: Secondary | ICD-10-CM

## 2024-05-04 DIAGNOSIS — I872 Venous insufficiency (chronic) (peripheral): Secondary | ICD-10-CM | POA: Diagnosis not present

## 2024-05-04 MED ORDER — DOXYCYCLINE HYCLATE 100 MG PO TABS: 100.0000 mg | ORAL_TABLET | Freq: Two times a day (BID) | ORAL | 1 refills | Status: DC

## 2024-05-04 NOTE — Progress Notes (Signed)
 She presents today for follow-up of her bilateral swollen legs.  States that she feels the compression hose helps she says but my right ankle is a bit warm and red to the touch.  Objective: Vital signs stable oriented x 3 pulses are strongly palpable.  No open lesions or wounds of the right ankle was mildly swollen with mild erythema does not appear to be cellulitic in nature appears to be more of a inflammation venous stasis kind of thing.  Assessment: Venous stasis with edema bilateral.  Possible cellulitis right.  Plan: Start her on a 10-day course of doxycycline  100 mg 1 twice daily I will follow-up with her in about 2 weeks to make sure she is doing well.

## 2024-05-19 ENCOUNTER — Other Ambulatory Visit: Payer: Self-pay | Admitting: Sports Medicine

## 2024-05-19 ENCOUNTER — Other Ambulatory Visit (HOSPITAL_COMMUNITY): Payer: Self-pay | Admitting: Sports Medicine

## 2024-05-19 DIAGNOSIS — R519 Headache, unspecified: Secondary | ICD-10-CM

## 2024-05-20 ENCOUNTER — Ambulatory Visit: Admitting: Podiatry

## 2024-05-28 ENCOUNTER — Other Ambulatory Visit: Payer: Self-pay | Admitting: Gastroenterology

## 2024-06-01 ENCOUNTER — Ambulatory Visit: Admitting: Cardiology

## 2024-06-02 ENCOUNTER — Other Ambulatory Visit

## 2024-06-05 ENCOUNTER — Inpatient Hospital Stay
Admission: RE | Admit: 2024-06-05 | Discharge: 2024-06-05 | Discharge status: Home / Self Care | Source: Ambulatory Visit | Attending: Sports Medicine | Admitting: Sports Medicine

## 2024-06-05 DIAGNOSIS — R519 Headache, unspecified: Secondary | ICD-10-CM

## 2024-06-05 MED ORDER — GADOPICLENOL 0.5 MMOL/ML IV SOLN
10.0000 mL | Freq: Once | INTRAVENOUS | Status: AC | PRN
Start: 2024-06-05 — End: 2024-06-05
  Administered 2024-06-05: 10 mL via INTRAVENOUS

## 2024-07-09 ENCOUNTER — Other Ambulatory Visit (HOSPITAL_COMMUNITY): Payer: Self-pay

## 2024-07-09 ENCOUNTER — Encounter: Payer: Self-pay | Admitting: Cardiology

## 2024-07-09 ENCOUNTER — Ambulatory Visit: Attending: Cardiology | Admitting: Cardiology

## 2024-07-09 ENCOUNTER — Telehealth: Payer: Self-pay | Admitting: Pharmacy Technician

## 2024-07-09 VITALS — BP 140/80 | HR 84 | Ht 65.0 in | Wt 287.4 lb

## 2024-07-09 DIAGNOSIS — I2510 Atherosclerotic heart disease of native coronary artery without angina pectoris: Secondary | ICD-10-CM | POA: Diagnosis not present

## 2024-07-09 DIAGNOSIS — I10 Essential (primary) hypertension: Secondary | ICD-10-CM | POA: Insufficient documentation

## 2024-07-09 DIAGNOSIS — G4733 Obstructive sleep apnea (adult) (pediatric): Secondary | ICD-10-CM | POA: Diagnosis present

## 2024-07-09 DIAGNOSIS — I471 Supraventricular tachycardia, unspecified: Secondary | ICD-10-CM | POA: Insufficient documentation

## 2024-07-09 MED ORDER — ZEPBOUND 12.5 MG/0.5ML ~~LOC~~ SOAJ: 12.5000 mg | SUBCUTANEOUS | 0 refills | Status: AC

## 2024-07-09 MED ORDER — ZEPBOUND 7.5 MG/0.5ML ~~LOC~~ SOAJ: 7.5000 mg | SUBCUTANEOUS | 0 refills | Status: AC

## 2024-07-09 MED ORDER — ZEPBOUND 10 MG/0.5ML ~~LOC~~ SOAJ: 10.0000 mg | SUBCUTANEOUS | 0 refills | Status: AC

## 2024-07-09 MED ORDER — ZEPBOUND 5 MG/0.5ML ~~LOC~~ SOAJ: 5.0000 mg | SUBCUTANEOUS | 0 refills | Status: AC

## 2024-07-09 MED ORDER — ZEPBOUND 15 MG/0.5ML ~~LOC~~ SOAJ: 15.0000 mg | SUBCUTANEOUS | 0 refills | Status: AC

## 2024-07-09 MED ORDER — TIRZEPATIDE-WEIGHT MANAGEMENT 2.5 MG/0.5ML ~~LOC~~ SOLN: 2.5000 mg | SUBCUTANEOUS | 0 refills | Status: AC

## 2024-07-09 NOTE — Progress Notes (Signed)
 Cardiology Office Note:    Date:  07/09/2024   ID:  Amy [name], DOB 04-26-1946, MRN [id number]  PCP:  Amy Velna SAUNDERS, MD   [facility]  Cardiologist:  Redell Cave, MD  Advanced Practice Provider:  No care team member to display Electrophysiologist:  None       Referring MD: Amy Velna SAUNDERS, MD   No chief complaint on file.   History of Present Illness:    Amy [name] is a 78 y.o. 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  Being seen for morbid obesity, insurance did not approve Wegovy  to help with weight loss.  Patient endorses fatigue, shortness of breath.  Has gained over 10 pounds since last clinic visit.  Compliant with medications as prescribed.  BP usually controlled at home in the 120s to 130s, complains of a headache today which may have caused her BP to be elevated.   Prior notes Cardiac monitor 6/24 1 episode of paroxysmal SVT. Coronary CTA 10/2022, mild LAD stenosis 25% Lexiscan  Myoview  01/2021 no evidence for ischemia, low risk study. Echo 02/07/2021 normal systolic function, EF 60 to 65%, Echo 06/2016 showed normal systolic function, EF 60 to 65%.  Impaired relaxation.  Past Medical History:  Diagnosis Date   Abdominal pain of unknown etiology 02/05/2016   Anxiety    Arthritis    ddd- RA   Asthma    sleeping asthma   Chronic back pain    lumbar steroid injection's   Complication of anesthesia    Hard to wake up. Pt sts is took 3 days.   Constipation    Depression    Dysphonia    intermittent voice changes   Esophageal dysmotility    Fibromyalgia    Fibromyalgia    GERD (gastroesophageal reflux disease)    Glaucoma    Headache    High cholesterol    ? pt states her doctor told her to continue eating vegetables & fruit   History of DVT (deep  vein thrombosis) 1989   LEFT UPPER ARM   History of hiatal hernia    History of MRSA infection 2011   Hypertension    Hypothyroidism    Irregular heart rate    years ago   Low iron    Lumbago    Mild obstructive sleep apnea    per study 02-07-2006 - no cpap   Neuropathy    feet   Pelvic pain in female    Peripheral vascular disease    poor circulation   SBO (small bowel obstruction) (HCC) 06/2016   Seasonal allergies    Short of breath on exertion    Urinary frequency    Vaginal atrophy    Weakness    both hands and feet    Past Surgical History:  Procedure Laterality Date   abdominal adhesions removed     ABDOMINAL HYSTERECTOMY  1978   BACK SURGERY     BUNIONECTOMY Left 2008   CATARACT EXTRACTION Bilateral 2017   COLON SURGERY     COLONOSCOPY     CYSTO WITH HYDRODISTENSION N/A 07/12/2015   Procedure: CYSTOSCOPY/HYDRODISTENSION;  Surgeon: Glendia Elizabeth, MD;  Location: [facility];  Service: Urology;  Laterality: N/A;   DILATION AND CURETTAGE OF UTERUS     ESOPHAGEAL MANOMETRY N/A 11/22/2014   Procedure: ESOPHAGEAL MANOMETRY (EM);  Surgeon: Lynwood  LITTIE Celestia Raddle., MD;  Location: [facility];  Service: Endoscopy;  Laterality: N/A;   ESOPHAGOGASTRODUODENOSCOPY N/A 07/15/2013   Procedure: ESOPHAGOGASTRODUODENOSCOPY (EGD);  Surgeon: Lynwood LITTIE Celestia Raddle., MD;  Location: [facility];  Service: Endoscopy;  Laterality: N/A;  need xray   EXPLORATORY LAPAROTOMY     EYE SURGERY Bilateral    cataract surgery with lens implants   HARDWARE REMOVAL N/A 01/02/2016   Procedure: Exploration of Lumbar Fusion,Removal of hardware Lumbar One-Two ;Redo Posterior Lumbar Fusion Lumbar One-Two;  Surgeon: Arley Helling, MD;  Location: [facility];  Service: Neurosurgery;  Laterality: N/A;   ingrown toenail removal     LAPAROSCOPIC CHOLECYSTECTOMY  01-11-2006   LAPAROSCOPY N/A 07/06/2016   Procedure: LAPAROSCOPY DIAGNOSTIC EMERGENT TO OPEN;  Surgeon: Camellia Blush, MD;  Location: [facility]  OR;  Service: General;  Laterality: N/A;   LAPAROTOMY N/A 07/06/2016   Procedure: EXPLORATORY LAPAROTOMY LYSIS OF ADHESIONS FOR 3 HOURS;  Surgeon: Camellia Blush, MD;  Location: [facility] OR;  Service: General;  Laterality: N/A;   lipoma removal     LUMBAR FUSION  2014   L4 -- L5   MASS EXCISION Left 02/20/2013   Procedure: EXCISION LEFT BUTTOCK  MASS;  Surgeon: Elspeth KYM Schultze, MD;  Location: [facility];  Service: General;  Laterality: Left;   NOSE SURGERY  2007   REMOVAL HARDWARE L4-L5/  BILATERAL LAMINECTOMY L2 - L5 AND FUSION  06-12-2011   SHOULDER OPEN ROTATOR CUFF REPAIR Left 05/05/2014   Procedure: OPEN ACROMIONECTOMY AND OPEN REPAIR OF ROTATOR CUFF, TISSUEMEND GRAFT WITH ANCHOR ;  Surgeon: Tanda DELENA Heading, MD;  Location: [facility];  Service: Orthopedics;  Laterality: Left;   SPINE SURGERY     x6   TONSILLECTOMY      Current Medications: Current Meds  Medication Sig   acetaminophen  (TYLENOL ) 500 MG tablet Take 1,000 mg by mouth every 6 (six) hours as needed for moderate pain.    Ascorbic Acid (VITAMIN C) 100 MG tablet Take 100 mg by mouth daily.   atorvastatin  (LIPITOR) 20 MG tablet Take 1 tablet (20 mg total) by mouth daily.   B Complex Vitamins (B COMPLEX PO) Take 1 tablet by mouth daily as needed.   carvedilol  (COREG ) 12.5 MG tablet Take 1 tablet (12.5 mg total) by mouth 2 (two) times daily.   cloNIDine  (CATAPRES ) 0.1 MG tablet Take 1 tablet (0.1 mg total) by mouth daily.   diclofenac  Sodium (VOLTAREN ) 1 % GEL Apply 1 g topically.   diphenhydrAMINE  (BENADRYL ) 25 MG tablet Take 25 mg by mouth 2 (two) times daily as needed for allergies.   estradiol  (ESTRACE ) 0.1 MG/GM vaginal cream Place 1 Applicatorful vaginally 2 (two) times a week.   fluticasone  (FLONASE ) 50 MCG/ACT nasal spray Place 1 spray into both nostrils daily.   furosemide  (LASIX ) 40 MG tablet TAKE 1 TABLET(40 MG) BY MOUTH DAILY   gabapentin  (NEURONTIN ) 600 MG tablet Take 1 tablet (600 mg total) by mouth 2 (two) times daily.    hydrALAZINE  (APRESOLINE ) 100 MG tablet Take 1 tablet (100 mg total) by mouth 2 (two) times daily.   KLOR-CON  M20 20 MEQ tablet Take 20 mEq by mouth daily.   levothyroxine (SYNTHROID) 25 MCG tablet Take 25 mcg by mouth every morning.   losartan  (COZAAR ) 100 MG tablet Take 100 mg by mouth daily.   Menthol , Topical Analgesic, (ICY HOT BACK EX) Apply 1 application topically 2 (two) times daily as needed (pain).   NIFEdipine  (PROCARDIA  XL/NIFEDICAL-XL) 90 MG 24 hr tablet Take  1 tablet (90 mg total) by mouth daily.   pantoprazole  (PROTONIX ) 40 MG tablet Take 80 mg by mouth daily.    Potassium Chloride  ER 20 MEQ TBCR Take 1 tablet by mouth daily.   tirzepatide (ZEPBOUND) 10 MG/0.5ML Pen Inject 10 mg into the skin once a week for 4 doses.   tirzepatide (ZEPBOUND) 12.5 MG/0.5ML Pen Inject 12.5 mg into the skin once a week for 4 doses.   tirzepatide (ZEPBOUND) 15 MG/0.5ML Pen Inject 15 mg into the skin once a week.   tirzepatide (ZEPBOUND) 2.5 MG/0.5ML injection vial Inject 2.5 mg into the skin once a week for 4 doses.   tirzepatide (ZEPBOUND) 5 MG/0.5ML Pen Inject 5 mg into the skin once a week for 4 doses.   tirzepatide (ZEPBOUND) 7.5 MG/0.5ML Pen Inject 7.5 mg into the skin once a week for 4 doses.   vitamin E  180 MG (400 UNITS) capsule Take 400 Units by mouth daily as needed.     Allergies:   Latex, Crestor [rosuvastatin calcium ], Penicillins, Tetanus toxoid-containing vaccines, and Tomato   Social History   Socioeconomic History   Marital status: Married    Spouse name: Not on file   Number of children: 1   Years of education: Not on file   Highest education level: Master's degree (e.g., MA, MS, MEng, MEd, MSW, MBA)  Occupational History   Not on file  Tobacco Use   Smoking status: Former    Current packs/day: 0.00    Average packs/day: 0.3 packs/day for 5.0 years (1.3 ttl pk-yrs)    Types: Cigarettes    Start date: 07/10/1964    Quit date: 07/10/1969    Years since quitting: 55.0    Smokeless tobacco: Never  Vaping Use   Vaping status: Never Used  Substance and Sexual Activity   Alcohol  use: Not Currently    Comment: rare, social   Drug use: No   Sexual activity: Not on file  Other Topics Concern   Not on file  Social History Narrative   Lives at home with husband    Right handed   Caffeine: max 2 cups coffee per day but not does not drink daily.   Social Drivers of Corporate investment banker Strain: Not on file  Food Insecurity: Not on file  Transportation Needs: Not on file  Physical Activity: Not on file  Stress: Not on file  Social Connections: Not on file     Family History: The patient's family history includes Aneurysm in her maternal grandmother; Cancer in her daughter, mother, and sister; Colon cancer in her father; Colon polyps in her father; Diabetes in her mother; Hypertension in her brother, brother, father, mother, and sister; Kidney disease in her brother and mother; Liver cancer in her brother and brother; Osteoarthritis in her mother; Stroke in her sister. There is no history of Migraines or Headache.  ROS:   Please see the history of present illness.     All other systems reviewed and are negative.  EKGs/Labs/Other Studies Reviewed:    The following studies were reviewed today:        Recent Labs: 02/10/2024: ALT 24; BUN 18; Creatinine, Ser 0.93; Hemoglobin 13.0; Platelets 379; Potassium 3.7; Sodium 141  Recent Lipid Panel    Component Value Date/Time   CHOL 165 07/03/2016 0627   TRIG 148 07/27/2016 0415   HDL 82 07/03/2016 0627   CHOLHDL 2.0 07/03/2016 0627   VLDL 8 07/03/2016 0627   LDLCALC 75 07/03/2016 9372  Risk Assessment/Calculations:      Physical Exam:    VS:  BP (!) 140/80 (BP Location: Left Arm, Patient Position: Sitting, Cuff Size: Normal)   Pulse 84   Ht 5' 5 (1.651 m)   Wt 287 lb 6.4 oz (130.4 kg)   SpO2 97%   BMI 47.83 kg/m     Wt Readings from Last 3 Encounters:  07/09/24 287 lb 6.4 oz  (130.4 kg)  01/28/24 276 lb (125.2 kg)  01/28/24 277 lb (125.6 kg)     GEN:  Well nourished, well developed in no acute distress HEENT: Normal NECK: No JVD; No carotid bruits CARDIAC: RRR, no murmurs, rubs, gallops RESPIRATORY:  Clear to auscultation without rales, wheezing or rhonchi  ABDOMEN: Soft, non-tender, non-distended MUSCULOSKELETAL:  trace edema; No deformit SKIN: Warm and dry NEUROLOGIC:  Alert and oriented x 3 PSYCHIATRIC:  Normal affect   ASSESSMENT:    1. Primary hypertension   2. Paroxysmal SVT (supraventricular tachycardia)   3. Coronary artery disease involving native coronary artery of native heart, unspecified whether angina present   4. Morbid obesity (HCC)   5. OSA (obstructive sleep apnea)    PLAN:    In order of problems listed above:  Hypertension, BP slightly elevated, previously controlled.  Continue nifedipine  90 mg daily, losartan  100 mg daily, hydralazine  100 mg 3 times daily,Coreg  12.5 mg twice daily, clonidine  to 0.1 mg nightly. Palpitations, occasional paroxysmal SVT.  Controlled on beta-blocker.  Continue Coreg  12.5 mg twice daily as above. Mild Nonobstructive CAD, ( proximal LAD stenosis 25%).  Denies chest pain. LDL at goal, continue Lipitor 20 mg daily.. morbid obesity.  OSA on CPAP.  Insurance did not approve Wegovy .  Order Zepbound, patient also has sleep apnea, HFpEF.  Continue Lasix  40 mg daily.   Follow-up in 3 months   Medication Adjustments/Labs and Tests Ordered: Current medicines are reviewed at length with the patient today.  Concerns regarding medicines are outlined above.  Orders Placed This Encounter  Procedures   HgB A1c    Meds ordered this encounter  Medications   tirzepatide (ZEPBOUND) 2.5 MG/0.5ML injection vial    Sig: Inject 2.5 mg into the skin once a week for 4 doses.    Dispense:  2 mL    Refill:  0   tirzepatide (ZEPBOUND) 5 MG/0.5ML Pen    Sig: Inject 5 mg into the skin once a week for 4 doses.     Dispense:  2 mL    Refill:  0   tirzepatide (ZEPBOUND) 7.5 MG/0.5ML Pen    Sig: Inject 7.5 mg into the skin once a week for 4 doses.    Dispense:  2 mL    Refill:  0   tirzepatide (ZEPBOUND) 10 MG/0.5ML Pen    Sig: Inject 10 mg into the skin once a week for 4 doses.    Dispense:  2 mL    Refill:  0   tirzepatide (ZEPBOUND) 12.5 MG/0.5ML Pen    Sig: Inject 12.5 mg into the skin once a week for 4 doses.    Dispense:  2 mL    Refill:  0   tirzepatide (ZEPBOUND) 15 MG/0.5ML Pen    Sig: Inject 15 mg into the skin once a week.    Dispense:  6 mL    Refill:  0     Patient Instructions  Medication Instructions:  - Inject zepbound into subcutaneous tissue once weekly on the same day   *If you need a refill  on your cardiac medications before your next appointment, please call your pharmacy*  Lab Work: Your provider would like for you to have following labs drawn today A1C.   If you have labs (blood work) drawn today and your tests are completely normal, you will receive your results only by: MyChart Message (if you have MyChart) OR A paper copy in the mail If you have any lab test that is abnormal or we need to change your treatment, we will call you to review the results.  Testing/Procedures: No test ordered today   Follow-Up: At [facility], you and your health needs are our priority.  As part of our continuing mission to provide you with exceptional heart care, our providers are all part of one team.  This team includes your primary Cardiologist (physician) and Advanced Practice Providers or APPs (Physician Assistants and Nurse Practitioners) who all work together to provide you with the care you need, when you need it.  Your next appointment:   3 month(s)  Provider:   You may see Redell Cave, MD or one of the following Advanced Practice Providers on your designated Care Team:   Lonni Meager, NP Lesley Maffucci, PA-C Bernardino Bring, PA-C Cadence [place],  PA-C Tylene Lunch, NP Barnie Hila, NP    We recommend signing up for the patient portal called MyChart.  Sign up information is provided on this After Visit Summary.  MyChart is used to connect with patients for Virtual Visits (Telemedicine).  Patients are able to view lab/test results, encounter notes, upcoming appointments, etc.  Non-urgent messages can be sent to your provider as well.   To learn more about what you can do with MyChart, go to [email or web address].              Signed, Redell Cave, MD  07/09/2024 10:44 AM    [facility]

## 2024-07-09 NOTE — Telephone Encounter (Signed)
 Pharmacy Patient Advocate Encounter  Received notification from [employer] that Prior Authorization for Zepbound has been APPROVED from 06/09/24 to 07/09/25. Ran test claim, Copay is $55.00- one month. This test claim was processed through [facility]- copay amounts may vary at other pharmacies due to pharmacy/plan contracts, or as the patient moves through the different stages of their insurance plan.   PA #/Case ID/Reference #: [id number]

## 2024-07-09 NOTE — Patient Instructions (Signed)
 Medication Instructions:  - Inject zepbound into subcutaneous tissue once weekly on the same day   *If you need a refill on your cardiac medications before your next appointment, please call your pharmacy*  Lab Work: Your provider would like for you to have following labs drawn today A1C.   If you have labs (blood work) drawn today and your tests are completely normal, you will receive your results only by: MyChart Message (if you have MyChart) OR A paper copy in the mail If you have any lab test that is abnormal or we need to change your treatment, we will call you to review the results.  Testing/Procedures: No test ordered today   Follow-Up: At [facility], you and your health needs are our priority.  As part of our continuing mission to provide you with exceptional heart care, our providers are all part of one team.  This team includes your primary Cardiologist (physician) and Advanced Practice Providers or APPs (Physician Assistants and Nurse Practitioners) who all work together to provide you with the care you need, when you need it.  Your next appointment:   3 month(s)  Provider:   You may see Redell Cave, MD or one of the following Advanced Practice Providers on your designated Care Team:   Lonni Meager, NP Lesley Maffucci, PA-C Bernardino Bring, PA-C Cadence [place], PA-C Tylene Lunch, NP Barnie Hila, NP    We recommend signing up for the patient portal called MyChart.  Sign up information is provided on this After Visit Summary.  MyChart is used to connect with patients for Virtual Visits (Telemedicine).  Patients are able to view lab/test results, encounter notes, upcoming appointments, etc.  Non-urgent messages can be sent to your provider as well.   To learn more about what you can do with MyChart, go to [email or web address].

## 2024-07-09 NOTE — Telephone Encounter (Signed)
 Pharmacy Patient Advocate Encounter   Received notification from [employer] that prior authorization for zepbound 2.5 MG is required/requested.   Insurance verification completed.   The patient is insured through [employer].   Per test claim: PA required; PA submitted to above mentioned insurance via Latent Key/confirmation #/EOC [id number] Status is pending

## 2024-07-10 ENCOUNTER — Ambulatory Visit: Payer: Self-pay | Admitting: Cardiology

## 2024-07-10 LAB — HEMOGLOBIN A1C
Est. average glucose Bld gHb Est-mCnc: 134 mg/dL
Hgb A1c MFr Bld: 6.3 % — ABNORMAL HIGH (ref 4.8–5.6)

## 2024-07-17 ENCOUNTER — Telehealth: Payer: Self-pay | Admitting: Cardiology

## 2024-07-17 NOTE — Telephone Encounter (Signed)
 Pt would like a c/b concerning Zepbound and the possible side affects. Please advise

## 2024-07-17 NOTE — Telephone Encounter (Signed)
 Returned pt's call and verified identity using 2 verifiers.  Pt's insurance approved her to start Zepbound, but after reading about the medication and it's side effects, pt has concerns and the main concern is the potential risk of thyroid  cancer.  She states that she already has thyroid  issues and is taking synthroid 25 mcg daily.  She stated that she didn't want to upset Dr. Darliss, but she doesn't want to take Zepbound.  I reassured her that no one was going to be upset and that it was ultimately her decision.  I did, however, suggest that she set up a consultation appointment with our PharmD to learn about other options for weightloss.  She is interested, but stated she would like to have a virtual consultation if possible.   I reached out to PharmD for clarification about virtual visits.  I reassured her that once I received an answer either myself or someone in our office would reach back out to her.  She thanked me for returning her call and being understanding.

## 2024-07-18 ENCOUNTER — Other Ambulatory Visit: Payer: Self-pay | Admitting: Cardiology

## 2024-07-21 NOTE — Telephone Encounter (Signed)
 Called patient with Dr Darliss recommendation. Patient verbalized understanding and will go pick up the medication and start.
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
## 2024-07-27 ENCOUNTER — Ambulatory Visit (HOSPITAL_COMMUNITY)
Admission: RE | Admit: 2024-07-27 | Discharge: 2024-07-27 | Disposition: A | Discharge status: Home / Self Care | Attending: Gastroenterology | Admitting: Gastroenterology

## 2024-07-27 ENCOUNTER — Encounter (HOSPITAL_COMMUNITY)
Admission: RE | Disposition: A | Discharge status: Home / Self Care | Payer: Self-pay | Source: Home / Self Care | Attending: Gastroenterology

## 2024-07-27 ENCOUNTER — Encounter (HOSPITAL_COMMUNITY): Payer: Self-pay | Admitting: Gastroenterology

## 2024-07-27 ENCOUNTER — Ambulatory Visit (HOSPITAL_COMMUNITY): Admitting: Anesthesiology

## 2024-07-27 ENCOUNTER — Other Ambulatory Visit: Payer: Self-pay

## 2024-07-27 DIAGNOSIS — K31A0 Gastric intestinal metaplasia, unspecified: Secondary | ICD-10-CM | POA: Diagnosis not present

## 2024-07-27 DIAGNOSIS — K2940 Chronic atrophic gastritis without bleeding: Secondary | ICD-10-CM | POA: Diagnosis present

## 2024-07-27 DIAGNOSIS — J45909 Unspecified asthma, uncomplicated: Secondary | ICD-10-CM | POA: Insufficient documentation

## 2024-07-27 DIAGNOSIS — K219 Gastro-esophageal reflux disease without esophagitis: Secondary | ICD-10-CM | POA: Diagnosis not present

## 2024-07-27 DIAGNOSIS — E039 Hypothyroidism, unspecified: Secondary | ICD-10-CM | POA: Insufficient documentation

## 2024-07-27 DIAGNOSIS — G4730 Sleep apnea, unspecified: Secondary | ICD-10-CM | POA: Insufficient documentation

## 2024-07-27 DIAGNOSIS — F32A Depression, unspecified: Secondary | ICD-10-CM | POA: Diagnosis not present

## 2024-07-27 DIAGNOSIS — M797 Fibromyalgia: Secondary | ICD-10-CM | POA: Diagnosis not present

## 2024-07-27 DIAGNOSIS — K259 Gastric ulcer, unspecified as acute or chronic, without hemorrhage or perforation: Secondary | ICD-10-CM | POA: Diagnosis not present

## 2024-07-27 DIAGNOSIS — F419 Anxiety disorder, unspecified: Secondary | ICD-10-CM | POA: Insufficient documentation

## 2024-07-27 DIAGNOSIS — Z79899 Other long term (current) drug therapy: Secondary | ICD-10-CM | POA: Diagnosis not present

## 2024-07-27 DIAGNOSIS — I10 Essential (primary) hypertension: Secondary | ICD-10-CM | POA: Diagnosis not present

## 2024-07-27 DIAGNOSIS — Z87891 Personal history of nicotine dependence: Secondary | ICD-10-CM | POA: Insufficient documentation

## 2024-07-27 DIAGNOSIS — M1990 Unspecified osteoarthritis, unspecified site: Secondary | ICD-10-CM | POA: Diagnosis not present

## 2024-07-27 DIAGNOSIS — K449 Diaphragmatic hernia without obstruction or gangrene: Secondary | ICD-10-CM | POA: Insufficient documentation

## 2024-07-27 DIAGNOSIS — K31A11 Gastric intestinal metaplasia without dysplasia, involving the antrum: Secondary | ICD-10-CM | POA: Diagnosis not present

## 2024-07-27 DIAGNOSIS — Z79890 Hormone replacement therapy (postmenopausal): Secondary | ICD-10-CM | POA: Insufficient documentation

## 2024-07-27 DIAGNOSIS — R519 Headache, unspecified: Secondary | ICD-10-CM | POA: Diagnosis not present

## 2024-07-27 DIAGNOSIS — E66813 Obesity, class 3: Secondary | ICD-10-CM | POA: Insufficient documentation

## 2024-07-27 DIAGNOSIS — Z6841 Body Mass Index (BMI) 40.0 and over, adult: Secondary | ICD-10-CM | POA: Insufficient documentation

## 2024-07-27 DIAGNOSIS — I739 Peripheral vascular disease, unspecified: Secondary | ICD-10-CM | POA: Insufficient documentation

## 2024-07-27 HISTORY — PX: ESOPHAGOGASTRODUODENOSCOPY: SHX5428

## 2024-07-27 SURGERY — EGD (ESOPHAGOGASTRODUODENOSCOPY)
Anesthesia: General

## 2024-07-27 MED ORDER — HYDROMORPHONE HCL 1 MG/ML IJ SOLN: 0.2500 mg | INTRAMUSCULAR | Status: DC | PRN

## 2024-07-27 MED ORDER — SODIUM CHLORIDE 0.9 % IV SOLN
INTRAVENOUS | Status: DC
Start: 2024-07-27 — End: 2024-07-27

## 2024-07-27 MED ORDER — PROPOFOL 10 MG/ML IV BOLUS
INTRAVENOUS | Status: DC | PRN
  Administered 2024-07-27: 100 ug/kg/min via INTRAVENOUS

## 2024-07-27 MED ORDER — OXYCODONE HCL 5 MG/5ML PO SOLN: 5.0000 mg | Freq: Once | ORAL | Status: DC | PRN

## 2024-07-27 MED ORDER — LIDOCAINE 2% (20 MG/ML) 5 ML SYRINGE
INTRAMUSCULAR | Status: DC | PRN
  Administered 2024-07-27: 60 mg via INTRAVENOUS

## 2024-07-27 MED ORDER — OXYCODONE HCL 5 MG PO TABS: 5.0000 mg | ORAL_TABLET | Freq: Once | ORAL | Status: DC | PRN

## 2024-07-27 NOTE — Interval H&P Note (Signed)
 History and Physical Interval Note: 78 year old female with history of gastric intestinal metaplasia, atrophic gastritis and gastric carcinoid for EGD with gastric mapping biopsies with propofol . 07/27/2024 8:11 AM  Amy [name]  has presented today for EGD with gastric mapping biopsies with propofol , with the diagnosis of K 31.AO Gastric Intestinal Metaplasia, atrophic gastritis and history of gastric carcinoid.  The various methods of treatment have been discussed with the patient and family. After consideration of risks, benefits and other options for treatment, the patient has consented to  Procedure(s): EGD (ESOPHAGOGASTRODUODENOSCOPY) (N/A) as a surgical intervention.  The patient's history has been reviewed, patient examined, no change in status, stable for surgery.  I have reviewed the patient's chart and labs.  Questions were answered to the patient's satisfaction.     [name]

## 2024-07-27 NOTE — Anesthesia Postprocedure Evaluation (Signed)
 Anesthesia Post Note  Patient: Amy [name]  Procedure(s) Performed: EGD (ESOPHAGOGASTRODUODENOSCOPY)     Patient location during evaluation: PACU Anesthesia Type: General Level of consciousness: awake and alert Pain management: pain level controlled Vital Signs Assessment: post-procedure vital signs reviewed and stable Respiratory status: spontaneous breathing, nonlabored ventilation and respiratory function stable Cardiovascular status: blood pressure returned to baseline and stable Postop Assessment: no apparent nausea or vomiting Anesthetic complications: no   No notable events documented.  Last Vitals:  Vitals:   07/27/24 0840 07/27/24 0850  BP: 117/65 119/77  Pulse: 78 79  Resp: 19 20  Temp: (!) 36.2 C   SpO2: 96% 94%    Last Pain:  Vitals:   07/27/24 0850  TempSrc:   PainSc: 0-No pain                 Butler Levander Pinal

## 2024-07-27 NOTE — Transfer of Care (Signed)
 Immediate Anesthesia Transfer of Care Note  Patient: Amy [name]  Procedure(s) Performed: EGD (ESOPHAGOGASTRODUODENOSCOPY)  Patient Location: PACU and Endoscopy Unit  Anesthesia Type:MAC  Level of Consciousness: awake and alert   Airway & Oxygen Therapy: Patient Spontanous Breathing and Patient connected to nasal cannula oxygen  Post-op Assessment: Report given to RN and Post -op Vital signs reviewed and stable  Post vital signs: Reviewed and stable  Last Vitals:  Vitals Value Taken Time  BP    Temp    Pulse 78 07/27/24 08:40  Resp 19 07/27/24 08:40  SpO2 96 % 07/27/24 08:40  Vitals shown include unfiled device data.  Last Pain:  Vitals:   07/27/24 0755  TempSrc: Temporal  PainSc: 0-No pain         Complications: No notable events documented.

## 2024-07-27 NOTE — Discharge Instructions (Signed)

## 2024-07-27 NOTE — Op Note (Signed)
 [facility] Patient Name: Amy [name] Procedure Date : 07/27/2024 MRN: [id number] Attending MD: [name] , MD, [id number] Date of Birth: Aug 09, 1946 CSN: [id number] Age: 78 Admit Type: Inpatient Procedure:                Upper GI endoscopy Indications:              Follow-up of intestinal metaplasia, history of                            gastric carcinoid and atrophic gastritis Providers:                [name], MD, Willy Hummer, RN, Felice Sar,                            Technician Referring MD:             Velna Skeeter, MD Medicines:                Monitored Anesthesia Care Complications:            No immediate complications. Estimated blood loss:                            Minimal. Estimated Blood Loss:     Estimated blood loss was minimal. Procedure:                Pre-Anesthesia Assessment:                           - Prior to the procedure, a History and Physical                            was performed, and patient medications and                            allergies were reviewed. The patient's tolerance of                            previous anesthesia was also reviewed. The risks                            and benefits of the procedure and the sedation                            options and risks were discussed with the patient.                            All questions were answered, and informed consent                            was obtained. Prior Anticoagulants: The patient has                            taken no anticoagulant or antiplatelet agents. ASA  Grade Assessment: III - A patient with severe                            systemic disease. After reviewing the risks and                            benefits, the patient was deemed in satisfactory                            condition to undergo the procedure.                           After obtaining informed consent, the endoscope was                             passed under direct vision. Throughout the                            procedure, the patient's blood pressure, pulse, and                            oxygen saturations were monitored continuously. The                            [id number]-[id number] ([id number]) Olympus endoscope was introduced                            through the mouth, and advanced to the second part                            of duodenum. The upper GI endoscopy was                            accomplished without difficulty. The patient                            tolerated the procedure well. Scope In: Scope Out: Findings:      The examined esophagus was normal.      The Z-line was regular and was found 40 cm from the incisors.      A large amount of food (residue) was found in the gastric body and on       the greater curvature of the stomach.      A few localized small erosions with no bleeding and no stigmata of       recent bleeding were found in the gastric body and on the greater       curvature of the stomach.      The examined duodenum was normal. Mucosa in the duodenal bulb appeared       to have dark mucosal pigmentation.      Retained food was also noted in the duodenal bulb.      Diffuse moderately erythematous mucosa without bleeding was found in the       gastric body, on the greater curvature of the gastric body, on the       lesser curvature of the  gastric body, at the incisura and in the gastric       antrum. Biopsies were taken with a cold forceps for histology.      Jar 1: Gastric antrum, Lesser curvature      Jar 2: Gastric antrum, Greater curvature      Jar 3: Incisura      Jar 4: Gastric body, Lesser curvature      Jar 5: Gastric body, Greater vurvature      The fundus and cardia could not be evaluated well due to presence of       retained food products. Impression:               - Normal esophagus.                           - Z-line regular, 40 cm from the incisors.                           - A large  amount of food (residue) in the stomach.                           - Erosive gastropathy with no bleeding and no                            stigmata of recent bleeding.                           - Normal examined duodenum.                           - Erythematous mucosa in the gastric body, greater                            curvature of the gastric body, lesser curvature of                            the gastric body, incisura and antrum. Biopsied. Moderate Sedation:      Patient did not receive moderate sedation for this procedure, but       instead received monitored anesthesia care. Recommendation:           - Patient has a contact number available for                            emergencies. The signs and symptoms of potential                            delayed complications were discussed with the                            patient. Return to normal activities tomorrow.                            Written discharge instructions were provided to the                            patient.                           -  Gastroparesis diet.                           - Continue present medications.                           - Await pathology results. Procedure Code(s):        --- Professional ---                           [phone], Esophagogastroduodenoscopy, flexible,                            transoral; with biopsy, single or multiple Diagnosis Code(s):        --- Professional ---                           K31.89, Other diseases of stomach and duodenum                           K31.A0, Gastric intestinal metaplasia, unspecified CPT copyright 2022 [employer]. All rights reserved. The codes documented in this report are preliminary and upon coder review may  be revised to meet current compliance requirements. [name], MD 07/27/2024 8:42:02 AM This report has been signed electronically. Number of Addenda: 0

## 2024-07-27 NOTE — Anesthesia Preprocedure Evaluation (Addendum)
 Anesthesia Evaluation  Patient identified by MRN, date of birth, ID band Patient awake    Reviewed: Allergy & Precautions, NPO status , Patient's Chart, lab work & pertinent test results  Airway Mallampati: II  TM Distance: >3 FB Neck ROM: Full    Dental  (+) Dental Advisory Given, Teeth Intact, Chipped,    Pulmonary asthma , sleep apnea , former smoker   breath sounds clear to auscultation       Cardiovascular hypertension, Pt. on home beta blockers and Pt. on medications + Peripheral Vascular Disease   Rhythm:Regular Rate:Normal     Neuro/Psych  Headaches PSYCHIATRIC DISORDERS Anxiety Depression     Neuromuscular disease    GI/Hepatic Neg liver ROS, hiatal hernia,GERD  Medicated,,  Endo/Other  Hypothyroidism  Class 3 obesity  Renal/GU negative Renal ROS     Musculoskeletal  (+) Arthritis ,  Fibromyalgia -  Abdominal  (+) + obese  Peds  Hematology   Anesthesia Other Findings   Reproductive/Obstetrics                              Lab Results  Component Value Date   WBC 6.0 02/10/2024   HGB 13.0 02/10/2024   HCT 40.3 02/10/2024   MCV 92.9 02/10/2024   PLT 379 02/10/2024   Lab Results  Component Value Date   CREATININE 0.93 02/10/2024   BUN 18 02/10/2024   NA 141 02/10/2024   K 3.7 02/10/2024   CL 103 02/10/2024   CO2 28 02/10/2024   Lab Results  Component Value Date   INR 1.08 07/03/2016   INR 0.95 04/21/2014   INR 0.91 06/08/2011   EKG: normal sinus rhythm.  Anesthesia Physical Anesthesia Plan  ASA: III  Anesthesia Plan: General   Post-op Pain Management: Minimal or no pain anticipated   Induction: Intravenous  PONV Risk Score and Plan: 3 and Propofol  infusion and Treatment may vary due to age or medical condition  Airway Management Planned: Nasal Cannula  Additional Equipment: None  Intra-op Plan:   Post-operative Plan:   Informed Consent: I have  reviewed the patients History and Physical, chart, labs and discussed the procedure including the risks, benefits and alternatives for the proposed anesthesia with the patient or authorized representative who has indicated his/her understanding and acceptance.     Dental advisory given  Plan Discussed with: CRNA  Anesthesia Plan Comments:          Anesthesia Quick Evaluation

## 2024-07-28 ENCOUNTER — Encounter (HOSPITAL_COMMUNITY): Payer: Self-pay | Admitting: Gastroenterology

## 2024-07-28 LAB — SURGICAL PATHOLOGY

## 2024-08-03 ENCOUNTER — Other Ambulatory Visit: Payer: Self-pay | Admitting: Cardiology

## 2024-08-05 MED ORDER — CARVEDILOL 12.5 MG PO TABS: 12.5000 mg | ORAL_TABLET | Freq: Two times a day (BID) | ORAL | 3 refills | Status: AC

## 2024-09-23 ENCOUNTER — Other Ambulatory Visit: Payer: Self-pay | Admitting: Podiatry

## 2024-10-08 ENCOUNTER — Ambulatory Visit: Admitting: Neurology

## 2024-10-12 ENCOUNTER — Ambulatory Visit: Admitting: Cardiology

## 2024-10-15 ENCOUNTER — Encounter: Payer: Self-pay | Admitting: Cardiology

## 2024-10-16 ENCOUNTER — Emergency Department (HOSPITAL_COMMUNITY)

## 2024-10-16 ENCOUNTER — Other Ambulatory Visit: Payer: Self-pay

## 2024-10-16 ENCOUNTER — Encounter (HOSPITAL_COMMUNITY): Payer: Self-pay

## 2024-10-16 ENCOUNTER — Emergency Department (HOSPITAL_COMMUNITY)
Admission: EM | Admit: 2024-10-16 | Discharge: 2024-10-17 | Disposition: A | Attending: Emergency Medicine | Admitting: Emergency Medicine

## 2024-10-16 DIAGNOSIS — S79911A Unspecified injury of right hip, initial encounter: Secondary | ICD-10-CM | POA: Diagnosis present

## 2024-10-16 DIAGNOSIS — W06XXXA Fall from bed, initial encounter: Secondary | ICD-10-CM | POA: Insufficient documentation

## 2024-10-16 DIAGNOSIS — S0990XA Unspecified injury of head, initial encounter: Secondary | ICD-10-CM | POA: Insufficient documentation

## 2024-10-16 DIAGNOSIS — M25561 Pain in right knee: Secondary | ICD-10-CM | POA: Insufficient documentation

## 2024-10-16 DIAGNOSIS — W19XXXA Unspecified fall, initial encounter: Secondary | ICD-10-CM

## 2024-10-16 DIAGNOSIS — M25551 Pain in right hip: Secondary | ICD-10-CM

## 2024-10-16 LAB — CBC WITH DIFFERENTIAL/PLATELET
Abs Immature Granulocytes: 0.06 K/uL (ref 0.00–0.07)
Basophils Absolute: 0 K/uL (ref 0.0–0.1)
Basophils Relative: 0 %
Eosinophils Absolute: 0.1 K/uL (ref 0.0–0.5)
Eosinophils Relative: 1 %
HCT: 39 % (ref 36.0–46.0)
Hemoglobin: 13.1 g/dL (ref 12.0–15.0)
Immature Granulocytes: 1 %
Lymphocytes Relative: 8 %
Lymphs Abs: 1 K/uL (ref 0.7–4.0)
MCH: 30.8 pg (ref 26.0–34.0)
MCHC: 33.6 g/dL (ref 30.0–36.0)
MCV: 91.5 fL (ref 80.0–100.0)
Monocytes Absolute: 1.7 K/uL — ABNORMAL HIGH (ref 0.1–1.0)
Monocytes Relative: 13 %
Neutro Abs: 10.2 K/uL — ABNORMAL HIGH (ref 1.7–7.7)
Neutrophils Relative %: 77 %
Platelets: 409 K/uL — ABNORMAL HIGH (ref 150–400)
RBC: 4.26 MIL/uL (ref 3.87–5.11)
RDW: 14.6 % (ref 11.5–15.5)
WBC: 13.2 K/uL — ABNORMAL HIGH (ref 4.0–10.5)
nRBC: 0 % (ref 0.0–0.2)

## 2024-10-16 LAB — COMPREHENSIVE METABOLIC PANEL WITH GFR
ALT: 43 U/L (ref 0–44)
AST: 95 U/L — ABNORMAL HIGH (ref 15–41)
Albumin: 3.6 g/dL (ref 3.5–5.0)
Alkaline Phosphatase: 148 U/L — ABNORMAL HIGH (ref 38–126)
Anion gap: 12 (ref 5–15)
BUN: 13 mg/dL (ref 8–23)
CO2: 25 mmol/L (ref 22–32)
Calcium: 9.3 mg/dL (ref 8.9–10.3)
Chloride: 101 mmol/L (ref 98–111)
Creatinine, Ser: 0.88 mg/dL (ref 0.44–1.00)
GFR, Estimated: >60 mL/min
Glucose, Bld: 123 mg/dL — ABNORMAL HIGH (ref 70–99)
Potassium: 3.5 mmol/L (ref 3.5–5.1)
Sodium: 138 mmol/L (ref 135–145)
Total Bilirubin: 0.8 mg/dL (ref 0.0–1.2)
Total Protein: 7.9 g/dL (ref 6.5–8.1)

## 2024-10-16 LAB — CK: Total CK: 1852 U/L — ABNORMAL HIGH (ref 38–234)

## 2024-10-16 MED ORDER — LACTATED RINGERS IV BOLUS
1000.0000 mL | Freq: Once | INTRAVENOUS | Status: AC
  Administered 2024-10-17: 1000 mL via INTRAVENOUS

## 2024-10-16 MED ORDER — OXYCODONE-ACETAMINOPHEN 5-325 MG PO TABS
2.0000 | ORAL_TABLET | Freq: Once | ORAL | Status: AC
  Administered 2024-10-17: 2 via ORAL
  Filled 2024-10-16: qty 2

## 2024-10-16 NOTE — ED Notes (Signed)
 Delay in medication due to pt being in CT. RN spoke to CT tech to confirm pt was still in scan

## 2024-10-16 NOTE — ED Triage Notes (Signed)
 Pt arrives with reports of slipping off the side of the bed and landing on buttock. Pt reports pain to right knee and right hip. Pt denies hitting head and denies blood thinners.

## 2024-10-16 NOTE — ED Provider Notes (Signed)
 " [facility] Emergency Department Provider Note MRN:  [id number]  Arrival date & time: 10/17/24     Chief Complaint   Fall   History of Present Illness   Amy [name] is a 79 y.o. year-old female presents to the ED with chief complaint of fall.  States that she was sitting on the edge of her bed last night and slipped and fell to the floor.  States that she slept on the floor and was on the floor all day today until this afternoon when her husband checked on her.  She states that she was recently on a cruise and used an runner, broadcasting/film/video chair for about a week and thinks that this has contributed to her deconditioning.  She states that she has had decreased mobility for about 4 days.  She states that she always has pain in the knees, but the hip pain is new since the fall last night.  History provided by patient.   Review of Systems  Pertinent positive and negative review of systems noted in HPI.    Physical Exam   Vitals:   10/17/24 0945 10/17/24 0954  BP: 130/85   Pulse: 80   Resp: 17   Temp:  98.9 F (37.2 C)  SpO2: 99%     CONSTITUTIONAL:  non toxic-appearing, NAD NEURO:  Alert and oriented x 3, CN 3-12 grossly intact EYES:  eyes equal and reactive ENT/NECK:  Supple, no stridor  CARDIO:  normal rate, regular rhythm, appears well-perfused  PULM:  No respiratory distress, CTAB GI/GU:  non-distended,  MSK/SPINE:  No gross deformities, no edema, moves all extremities  SKIN:  no rash, atraumatic   *Additional and/or pertinent findings included in MDM below  Diagnostic and Interventional Summary    EKG Interpretation Date/Time:    Ventricular Rate:    PR Interval:    QRS Duration:    QT Interval:    QTC Calculation:   R Axis:      Text Interpretation:         Labs Reviewed  CBC WITH DIFFERENTIAL/PLATELET - Abnormal; Notable for the following components:      Result Value   WBC 13.2 (*)    Platelets 409 (*)    Neutro Abs  10.2 (*)    Monocytes Absolute 1.7 (*)    All other components within normal limits  COMPREHENSIVE METABOLIC PANEL WITH GFR - Abnormal; Notable for the following components:   Glucose, Bld 123 (*)    AST 95 (*)    Alkaline Phosphatase 148 (*)    All other components within normal limits  CK - Abnormal; Notable for the following components:   Total CK 1,852 (*)    All other components within normal limits  URINALYSIS, ROUTINE W REFLEX MICROSCOPIC - Abnormal; Notable for the following components:   APPearance HAZY (*)    All other components within normal limits  CK - Abnormal; Notable for the following components:   Total CK 1,383 (*)    All other components within normal limits    CT Hip Right Wo Contrast  Final Result    DG Hip Unilat W or Wo Pelvis 2-3 Views Right  Final Result    CT HEAD WO CONTRAST ( )  Final Result    CT Lumbar Spine Wo Contrast  Final Result    CT Cervical Spine Wo Contrast  Final Result    DG Chest Port 1 View  Final Result      Medications  oxyCODONE -acetaminophen  (PERCOCET/ROXICET) 5-325 MG per tablet 2 tablet (2 tablets Oral Given 10/17/24 0005)  lactated ringers  bolus 1,000 mL (0 mLs Intravenous Stopped 10/17/24 0332)  alum & mag hydroxide-simeth (MAALOX/MYLANTA) 200-200-20 MG/5ML suspension 30 mL (30 mLs Oral Given 10/17/24 1033)    And  lidocaine  (XYLOCAINE ) 2 % viscous mouth solution 15 mL (15 mLs Oral Given 10/17/24 1033)     Procedures  /  Critical Care Procedures  ED Course and Medical Decision Making  I have reviewed the triage vital signs, the nursing notes, and pertinent available records from the EMR.  Social Determinants Affecting Complexity of Care: Patient has no clinically significant social determinants affecting this chief complaint..   ED Course: Clinical Course as of 10/17/24 2212  Sat Oct 17, 2024  9258 Had a mechanical fall, slipped off of the bed and injury right hip, reassuring physical exam, ambulates, but  waiting on right hip CT scan to rule out occult fracture.  Home health ordered for at home face-to-face evaluation by PT/OT at home by prior PA.  [JB]  [id number] Reassessed, continue to be waiting on CT right hip, patient and family at bedside would like to wait for results, agreeable to stay a bit longer. [JB]  1012 CT of the right hip shows no acute findings.  Patient has been stable throughout stay and has had reassuring workup here.  I do feel she stable for discharge home with close outpatient follow-up.  Patient and caregiver present in room are agreeable to plan. [JB]    Clinical Course User Index [JB] Barrett, Warren SAILOR, PA-C    Medical Decision Making Patient here with right knee pain and hip pain.  Had a fall from 2 nights ago in which she was sitting on the edge of the bed and slid down onto the ground.  She stayed on the ground all night and into the afternoon yesterday before she was discovered by her husband.  She states that she has had decreased mobility over the past several days.  States that she normally walks some, but also relies on an leisure centre manager power chair.  She states that she has not been walking as much, and may have become deconditioned.    laboratory workup is thus far fairly reassuring.  She did have mildly elevated CK, but this is now downtrending after some fluid.  Doubt rhabdomyolysis.  No acute traumatic findings seen on CTs or plain films.  She is able to stand and take few steps with assistance.  Seems like this is about her recent baseline.  Will check CT right hip to rule out occult fracture.  If negative, will plan for discharge home with home health/PT/OT/social work assistance.  I have placed these orders.  I did discuss this plan with the patient, she is agreeable.  Patient also seen by and discussed with Dr. Griselda, who agrees with the plan.  Amount and/or Complexity of Data Reviewed Labs: ordered. Radiology: ordered.  Risk OTC  drugs. Prescription drug management.         Consultants:    Treatment and Plan: Patient signed out at shift change.  Plan: Follow-up on right hip CT.  Anticipate discharge.     Final Clinical Impressions(s) / ED Diagnoses     ICD-10-CM   1. Fall, initial encounter  W19.XXXA     2. Pain of right hip  M25.551       ED Discharge Orders          Ordered  meloxicam  (MOBIC ) 7.5 MG tablet  Daily        10/17/24 0958    lidocaine  (LIDODERM ) 5 %  Every 24 hours        10/17/24 9041              Discharge Instructions Discussed with and Provided to Patient:     Discharge Instructions      Please take 1000 mg of Tylenol  every 8 hours.  Take meloxicam  as needed for pain once a day.  Use ice, heat or lidocaine  patch over area of pain.  You should be receiving a call from home health for follow-up.  Also call primary care to schedule follow-up appointment and return to the ED with new or worsening symptoms.       Vicky Charleston, PA-C 10/17/24 2212  "

## 2024-10-16 NOTE — ED Notes (Signed)
 Patient transported to CT

## 2024-10-17 ENCOUNTER — Emergency Department (HOSPITAL_COMMUNITY)

## 2024-10-17 LAB — URINALYSIS, ROUTINE W REFLEX MICROSCOPIC
Bilirubin Urine: NEGATIVE
Glucose, UA: NEGATIVE mg/dL
Hgb urine dipstick: NEGATIVE
Ketones, ur: NEGATIVE mg/dL
Leukocytes,Ua: NEGATIVE
Nitrite: NEGATIVE
Protein, ur: NEGATIVE mg/dL
Specific Gravity, Urine: 1.009 (ref 1.005–1.030)
pH: 5 (ref 5.0–8.0)

## 2024-10-17 LAB — CK: Total CK: 1383 U/L — ABNORMAL HIGH (ref 38–234)

## 2024-10-17 MED ORDER — LIDOCAINE VISCOUS HCL 2 % MT SOLN
15.0000 mL | Freq: Once | OROMUCOSAL | Status: AC
  Administered 2024-10-17: 15 mL via ORAL
  Filled 2024-10-17: qty 15

## 2024-10-17 MED ORDER — MELOXICAM 7.5 MG PO TABS: 7.5000 mg | ORAL_TABLET | Freq: Every day | ORAL | 0 refills | Status: AC

## 2024-10-17 MED ORDER — LIDOCAINE 5 % EX PTCH: 1.0000 | MEDICATED_PATCH | CUTANEOUS | 0 refills | Status: AC

## 2024-10-17 MED ORDER — ALUM & MAG HYDROXIDE-SIMETH 200-200-20 MG/5ML PO SUSP
30.0000 mL | Freq: Once | ORAL | Status: AC
  Administered 2024-10-17: 30 mL via ORAL
  Filled 2024-10-17: qty 30

## 2024-10-17 NOTE — ED Notes (Signed)
 Patient transported to CT

## 2024-10-17 NOTE — ED Provider Notes (Signed)
" °  Physical Exam  BP (!) 129/91 (BP Location: Left Arm)   Pulse 86   Temp 98 F (36.7 C) (Oral)   Resp 20   Wt 127 kg   SpO2 99%   BMI 46.59 kg/m   Physical Exam Cardiovascular:     Rate and Rhythm: Normal rate and regular rhythm.  Pulmonary:     Effort: Pulmonary effort is normal.     Breath sounds: Normal breath sounds.  Abdominal:     General: Abdomen is flat. Bowel sounds are normal.     Palpations: Abdomen is soft.     Procedures  Procedures  ED Course / MDM   Clinical Course as of 10/17/24 1012  Sat Oct 17, 2024  9258 Had a mechanical fall, slipped off of the bed and injury right hip, reassuring physical exam, ambulates, but waiting on right hip CT scan to rule out occult fracture.  Home health ordered for at home face-to-face evaluation by PT/OT at home by prior PA.  [JB]  [id number] Reassessed, continue to be waiting on CT right hip, patient and family at bedside would like to wait for results, agreeable to stay a bit longer. [JB]  1012 CT of the right hip shows no acute findings.  Patient has been stable throughout stay and has had reassuring workup here.  I do feel she stable for discharge home with close outpatient follow-up.  Patient and caregiver present in room are agreeable to plan. [JB]    Clinical Course User Index [JB] Deonne Rooks, Warren SAILOR, PA-C   Medical Decision Making Amount and/or Complexity of Data Reviewed Labs: ordered. Radiology: ordered.  Risk OTC drugs. Prescription drug management.         Shermon Warren SAILOR DEVONNA 10/17/24 1012  Tegeler, Lonni PARAS, MD 10/17/24 1528 "

## 2024-10-17 NOTE — ED Notes (Signed)
 Called for ptar pick up

## 2024-10-17 NOTE — Discharge Instructions (Signed)
 Please take 1000 mg of Tylenol  every 8 hours.  Take meloxicam  as needed for pain once a day.  Use ice, heat or lidocaine  patch over area of pain.  You should be receiving a call from home health for follow-up.  Also call primary care to schedule follow-up appointment and return to the ED with new or worsening symptoms.

## 2024-10-23 ENCOUNTER — Telehealth: Payer: Self-pay

## 2024-10-23 DIAGNOSIS — R296 Repeated falls: Secondary | ICD-10-CM

## 2024-10-27 ENCOUNTER — Ambulatory Visit: Admitting: Neurology
